# Patient Record
Sex: Male | Born: 1955
Health system: Southern US, Community
[De-identification: ages and names within clinical notes are randomized; demographics above are authoritative.]

## PROBLEM LIST (undated history)

## (undated) DIAGNOSIS — I219 Acute myocardial infarction, unspecified: Secondary | ICD-10-CM

## (undated) DIAGNOSIS — N289 Disorder of kidney and ureter, unspecified: Secondary | ICD-10-CM

## (undated) DIAGNOSIS — M1990 Unspecified osteoarthritis, unspecified site: Secondary | ICD-10-CM

## (undated) DIAGNOSIS — R5383 Other fatigue: Secondary | ICD-10-CM

## (undated) DIAGNOSIS — R011 Cardiac murmur, unspecified: Secondary | ICD-10-CM

## (undated) DIAGNOSIS — E876 Hypokalemia: Secondary | ICD-10-CM

## (undated) DIAGNOSIS — I5042 Chronic combined systolic (congestive) and diastolic (congestive) heart failure: Secondary | ICD-10-CM

## (undated) DIAGNOSIS — M869 Osteomyelitis, unspecified: Secondary | ICD-10-CM

## (undated) DIAGNOSIS — I509 Heart failure, unspecified: Secondary | ICD-10-CM

## (undated) DIAGNOSIS — I249 Acute ischemic heart disease, unspecified: Secondary | ICD-10-CM

## (undated) DIAGNOSIS — K3184 Gastroparesis: Secondary | ICD-10-CM

## (undated) DIAGNOSIS — E1142 Type 2 diabetes mellitus with diabetic polyneuropathy: Secondary | ICD-10-CM

## (undated) DIAGNOSIS — E119 Type 2 diabetes mellitus without complications: Secondary | ICD-10-CM

## (undated) DIAGNOSIS — M2550 Pain in unspecified joint: Secondary | ICD-10-CM

## (undated) DIAGNOSIS — D860 Sarcoidosis of lung: Secondary | ICD-10-CM

## (undated) DIAGNOSIS — R0602 Shortness of breath: Secondary | ICD-10-CM

## (undated) DIAGNOSIS — I255 Ischemic cardiomyopathy: Secondary | ICD-10-CM

## (undated) DIAGNOSIS — E785 Hyperlipidemia, unspecified: Secondary | ICD-10-CM

## (undated) DIAGNOSIS — N183 Chronic kidney disease, stage 3 unspecified: Secondary | ICD-10-CM

## (undated) DIAGNOSIS — G4730 Sleep apnea, unspecified: Secondary | ICD-10-CM

## (undated) DIAGNOSIS — G8929 Other chronic pain: Secondary | ICD-10-CM

## (undated) DIAGNOSIS — I252 Old myocardial infarction: Secondary | ICD-10-CM

## (undated) DIAGNOSIS — R1310 Dysphagia, unspecified: Secondary | ICD-10-CM

## (undated) DIAGNOSIS — M545 Low back pain, unspecified: Secondary | ICD-10-CM

## (undated) DIAGNOSIS — K5900 Constipation, unspecified: Secondary | ICD-10-CM

## (undated) DIAGNOSIS — E1110 Type 2 diabetes mellitus with ketoacidosis without coma: Secondary | ICD-10-CM

## (undated) DIAGNOSIS — I2510 Atherosclerotic heart disease of native coronary artery without angina pectoris: Secondary | ICD-10-CM

## (undated) DIAGNOSIS — D649 Anemia, unspecified: Secondary | ICD-10-CM

## (undated) DIAGNOSIS — F329 Major depressive disorder, single episode, unspecified: Secondary | ICD-10-CM

## (undated) DIAGNOSIS — K259 Gastric ulcer, unspecified as acute or chronic, without hemorrhage or perforation: Secondary | ICD-10-CM

## (undated) DIAGNOSIS — M109 Gout, unspecified: Secondary | ICD-10-CM

## (undated) DIAGNOSIS — E1143 Type 2 diabetes mellitus with diabetic autonomic (poly)neuropathy: Secondary | ICD-10-CM

## (undated) DIAGNOSIS — K625 Hemorrhage of anus and rectum: Secondary | ICD-10-CM

## (undated) DIAGNOSIS — G4733 Obstructive sleep apnea (adult) (pediatric): Secondary | ICD-10-CM

## (undated) DIAGNOSIS — Z9989 Dependence on other enabling machines and devices: Secondary | ICD-10-CM

## (undated) DIAGNOSIS — E538 Deficiency of other specified B group vitamins: Secondary | ICD-10-CM

## (undated) DIAGNOSIS — I10 Essential (primary) hypertension: Secondary | ICD-10-CM

## (undated) DIAGNOSIS — I119 Hypertensive heart disease without heart failure: Secondary | ICD-10-CM

## (undated) DIAGNOSIS — J449 Chronic obstructive pulmonary disease, unspecified: Secondary | ICD-10-CM

## (undated) DIAGNOSIS — K219 Gastro-esophageal reflux disease without esophagitis: Secondary | ICD-10-CM

## (undated) DIAGNOSIS — R079 Chest pain, unspecified: Secondary | ICD-10-CM

## (undated) DIAGNOSIS — F32A Depression, unspecified: Secondary | ICD-10-CM

## (undated) HISTORY — DX: Osteomyelitis, unspecified: M86.9

## (undated) HISTORY — DX: Ischemic cardiomyopathy: I25.5

## (undated) HISTORY — DX: Acute ischemic heart disease, unspecified: I24.9

## (undated) HISTORY — DX: Hypertensive heart disease without heart failure: I11.9

## (undated) HISTORY — DX: Sleep apnea, unspecified: G47.30

## (undated) HISTORY — DX: Disorder of kidney and ureter, unspecified: N28.9

## (undated) HISTORY — DX: Heart failure, unspecified: I50.9

## (undated) HISTORY — DX: Hypokalemia: E87.6

## (undated) HISTORY — PX: BREAST LUMPECTOMY: SHX2

## (undated) HISTORY — DX: Atherosclerotic heart disease of native coronary artery without angina pectoris: I25.10

## (undated) HISTORY — DX: Other chronic pain: G89.29

## (undated) HISTORY — PX: CATARACT EXTRACTION W/ INTRAOCULAR LENS  IMPLANT, BILATERAL: SHX1307

## (undated) HISTORY — DX: Low back pain, unspecified: M54.50

## (undated) HISTORY — DX: Chronic kidney disease, stage 3 unspecified: N18.30

## (undated) HISTORY — DX: Chest pain, unspecified: R07.9

## (undated) HISTORY — DX: Unspecified osteoarthritis, unspecified site: M19.90

## (undated) HISTORY — DX: Gastric ulcer, unspecified as acute or chronic, without hemorrhage or perforation: K25.9

## (undated) HISTORY — PX: TOE AMPUTATION: SHX809

## (undated) HISTORY — DX: Shortness of breath: R06.02

## (undated) HISTORY — DX: Dysphagia, unspecified: R13.10

## (undated) HISTORY — DX: Type 2 diabetes mellitus without complications: E11.9

## (undated) HISTORY — DX: Type 2 diabetes mellitus with diabetic polyneuropathy: E11.42

## (undated) HISTORY — DX: Deficiency of other specified B group vitamins: E53.8

## (undated) HISTORY — DX: Old myocardial infarction: I25.2

## (undated) HISTORY — DX: Chronic combined systolic (congestive) and diastolic (congestive) heart failure: I50.42

## (undated) HISTORY — DX: Chronic obstructive pulmonary disease, unspecified: J44.9

## (undated) HISTORY — DX: Pain in unspecified joint: M25.50

## (undated) HISTORY — DX: Morbid (severe) obesity due to excess calories: E66.01

## (undated) HISTORY — DX: Chronic kidney disease, stage 3 (moderate): N18.3

## (undated) HISTORY — DX: Other fatigue: R53.83

## (undated) HISTORY — DX: Constipation, unspecified: K59.00

---

## 1998-09-11 ENCOUNTER — Encounter: Admission: RE | Admit: 1998-09-11 | Discharge: 1998-12-10 | Payer: Self-pay | Admitting: Endocrinology

## 1999-09-23 ENCOUNTER — Encounter: Admission: RE | Admit: 1999-09-23 | Discharge: 1999-12-22 | Payer: Self-pay | Admitting: Endocrinology

## 1999-09-23 ENCOUNTER — Emergency Department (HOSPITAL_COMMUNITY): Admission: EM | Admit: 1999-09-23 | Discharge: 1999-09-23 | Payer: Self-pay | Admitting: Emergency Medicine

## 1999-09-24 ENCOUNTER — Encounter: Admission: RE | Admit: 1999-09-24 | Discharge: 1999-09-24 | Payer: Self-pay | Admitting: Surgery

## 1999-09-25 ENCOUNTER — Ambulatory Visit (HOSPITAL_BASED_OUTPATIENT_CLINIC_OR_DEPARTMENT_OTHER): Admission: RE | Admit: 1999-09-25 | Discharge: 1999-09-25 | Payer: Self-pay | Admitting: Surgery

## 1999-10-02 ENCOUNTER — Emergency Department (HOSPITAL_COMMUNITY): Admission: EM | Admit: 1999-10-02 | Discharge: 1999-10-02 | Payer: Self-pay | Admitting: Emergency Medicine

## 1999-10-09 ENCOUNTER — Emergency Department (HOSPITAL_COMMUNITY): Admission: EM | Admit: 1999-10-09 | Discharge: 1999-10-09 | Payer: Self-pay | Admitting: Emergency Medicine

## 2000-11-04 ENCOUNTER — Emergency Department (HOSPITAL_COMMUNITY): Admission: EM | Admit: 2000-11-04 | Discharge: 2000-11-04 | Payer: Self-pay | Admitting: Emergency Medicine

## 2000-11-04 ENCOUNTER — Encounter: Payer: Self-pay | Admitting: Emergency Medicine

## 2001-04-14 ENCOUNTER — Encounter: Payer: Self-pay | Admitting: Internal Medicine

## 2001-04-14 ENCOUNTER — Encounter: Admission: RE | Admit: 2001-04-14 | Discharge: 2001-04-14 | Payer: Self-pay | Admitting: Internal Medicine

## 2001-10-28 ENCOUNTER — Emergency Department (HOSPITAL_COMMUNITY): Admission: EM | Admit: 2001-10-28 | Discharge: 2001-10-28 | Payer: Self-pay | Admitting: Emergency Medicine

## 2001-10-29 ENCOUNTER — Inpatient Hospital Stay (HOSPITAL_COMMUNITY): Admission: EM | Admit: 2001-10-29 | Discharge: 2001-10-30 | Payer: Self-pay

## 2006-06-28 ENCOUNTER — Encounter (HOSPITAL_BASED_OUTPATIENT_CLINIC_OR_DEPARTMENT_OTHER): Admission: RE | Admit: 2006-06-28 | Discharge: 2006-09-26 | Payer: Self-pay | Admitting: Surgery

## 2006-07-08 ENCOUNTER — Ambulatory Visit (HOSPITAL_COMMUNITY): Admission: RE | Admit: 2006-07-08 | Discharge: 2006-07-08 | Payer: Self-pay | Admitting: Surgery

## 2006-12-07 ENCOUNTER — Emergency Department (HOSPITAL_COMMUNITY): Admission: EM | Admit: 2006-12-07 | Discharge: 2006-12-07 | Payer: Self-pay | Admitting: Emergency Medicine

## 2008-03-14 ENCOUNTER — Emergency Department (HOSPITAL_COMMUNITY): Admission: EM | Admit: 2008-03-14 | Discharge: 2008-03-14 | Payer: Self-pay | Admitting: Emergency Medicine

## 2008-05-31 ENCOUNTER — Inpatient Hospital Stay (HOSPITAL_COMMUNITY): Admission: AD | Admit: 2008-05-31 | Discharge: 2008-06-03 | Payer: Self-pay | Admitting: Internal Medicine

## 2008-06-01 ENCOUNTER — Encounter (INDEPENDENT_AMBULATORY_CARE_PROVIDER_SITE_OTHER): Payer: Self-pay | Admitting: Orthopedic Surgery

## 2010-12-13 HISTORY — PX: TOE AMPUTATION: SHX809

## 2010-12-15 ENCOUNTER — Encounter (HOSPITAL_BASED_OUTPATIENT_CLINIC_OR_DEPARTMENT_OTHER)
Admission: RE | Admit: 2010-12-15 | Discharge: 2011-01-12 | Payer: Self-pay | Source: Home / Self Care | Attending: General Surgery | Admitting: General Surgery

## 2010-12-15 ENCOUNTER — Ambulatory Visit (HOSPITAL_COMMUNITY)
Admission: RE | Admit: 2010-12-15 | Discharge: 2010-12-15 | Payer: Self-pay | Source: Home / Self Care | Attending: General Surgery | Admitting: General Surgery

## 2010-12-21 ENCOUNTER — Ambulatory Visit
Admission: RE | Admit: 2010-12-21 | Discharge: 2010-12-21 | Payer: Self-pay | Source: Home / Self Care | Attending: Vascular Surgery | Admitting: Vascular Surgery

## 2010-12-21 ENCOUNTER — Ambulatory Visit: Admit: 2010-12-21 | Payer: Self-pay | Admitting: Vascular Surgery

## 2010-12-27 ENCOUNTER — Encounter: Payer: Self-pay | Admitting: Infectious Diseases

## 2010-12-27 ENCOUNTER — Encounter
Admission: RE | Admit: 2010-12-27 | Discharge: 2010-12-27 | Payer: Self-pay | Source: Home / Self Care | Attending: Family Medicine | Admitting: Family Medicine

## 2011-01-12 ENCOUNTER — Encounter: Payer: Self-pay | Admitting: Infectious Diseases

## 2011-01-12 ENCOUNTER — Encounter: Payer: Self-pay | Admitting: *Deleted

## 2011-01-12 ENCOUNTER — Ambulatory Visit
Admission: RE | Admit: 2011-01-12 | Discharge: 2011-01-12 | Payer: Self-pay | Source: Home / Self Care | Attending: Infectious Diseases | Admitting: Infectious Diseases

## 2011-01-12 DIAGNOSIS — E785 Hyperlipidemia, unspecified: Secondary | ICD-10-CM | POA: Insufficient documentation

## 2011-01-12 DIAGNOSIS — E119 Type 2 diabetes mellitus without complications: Secondary | ICD-10-CM | POA: Insufficient documentation

## 2011-01-12 DIAGNOSIS — M869 Osteomyelitis, unspecified: Secondary | ICD-10-CM | POA: Insufficient documentation

## 2011-01-12 DIAGNOSIS — I10 Essential (primary) hypertension: Secondary | ICD-10-CM | POA: Insufficient documentation

## 2011-01-12 DIAGNOSIS — K219 Gastro-esophageal reflux disease without esophagitis: Secondary | ICD-10-CM | POA: Insufficient documentation

## 2011-01-12 DIAGNOSIS — M86179 Other acute osteomyelitis, unspecified ankle and foot: Secondary | ICD-10-CM | POA: Insufficient documentation

## 2011-01-12 DIAGNOSIS — M86172 Other acute osteomyelitis, left ankle and foot: Secondary | ICD-10-CM | POA: Insufficient documentation

## 2011-01-13 ENCOUNTER — Encounter (HOSPITAL_BASED_OUTPATIENT_CLINIC_OR_DEPARTMENT_OTHER): Payer: BC Managed Care – PPO | Attending: General Surgery

## 2011-01-13 ENCOUNTER — Other Ambulatory Visit: Payer: Self-pay

## 2011-01-13 DIAGNOSIS — L97509 Non-pressure chronic ulcer of other part of unspecified foot with unspecified severity: Secondary | ICD-10-CM | POA: Insufficient documentation

## 2011-01-13 DIAGNOSIS — M869 Osteomyelitis, unspecified: Secondary | ICD-10-CM | POA: Insufficient documentation

## 2011-01-13 DIAGNOSIS — E1169 Type 2 diabetes mellitus with other specified complication: Secondary | ICD-10-CM | POA: Insufficient documentation

## 2011-01-13 DIAGNOSIS — Z794 Long term (current) use of insulin: Secondary | ICD-10-CM | POA: Insufficient documentation

## 2011-01-13 DIAGNOSIS — S98139A Complete traumatic amputation of one unspecified lesser toe, initial encounter: Secondary | ICD-10-CM | POA: Insufficient documentation

## 2011-01-14 ENCOUNTER — Encounter: Payer: Self-pay | Admitting: Infectious Diseases

## 2011-01-14 ENCOUNTER — Other Ambulatory Visit: Payer: Self-pay

## 2011-01-15 ENCOUNTER — Other Ambulatory Visit: Payer: Self-pay

## 2011-01-18 ENCOUNTER — Encounter (HOSPITAL_BASED_OUTPATIENT_CLINIC_OR_DEPARTMENT_OTHER)
Admission: RE | Admit: 2011-01-18 | Discharge: 2011-01-18 | Disposition: A | Discharge status: Home / Self Care | Payer: BC Managed Care – PPO | Source: Ambulatory Visit | Attending: Orthopedic Surgery | Admitting: Orthopedic Surgery

## 2011-01-18 ENCOUNTER — Other Ambulatory Visit: Payer: Self-pay | Admitting: Orthopedic Surgery

## 2011-01-18 ENCOUNTER — Encounter (HOSPITAL_BASED_OUTPATIENT_CLINIC_OR_DEPARTMENT_OTHER): Payer: BC Managed Care – PPO

## 2011-01-18 ENCOUNTER — Other Ambulatory Visit: Payer: Self-pay

## 2011-01-18 DIAGNOSIS — Z01810 Encounter for preprocedural cardiovascular examination: Secondary | ICD-10-CM | POA: Insufficient documentation

## 2011-01-18 LAB — BASIC METABOLIC PANEL
BUN: 14 mg/dL (ref 6–23)
CO2: 27 mEq/L (ref 19–32)
Calcium: 9.1 mg/dL (ref 8.4–10.5)
Chloride: 105 mEq/L (ref 96–112)
Creatinine, Ser: 1.09 mg/dL (ref 0.4–1.5)
GFR calc Af Amer: >60 mL/min (ref >60)
GFR calc non Af Amer: >60 mL/min (ref >60)
Glucose, Bld: 79 mg/dL (ref 70–99)
Potassium: 3.8 mEq/L (ref 3.5–5.1)
Sodium: 140 mEq/L (ref 135–145)

## 2011-01-19 ENCOUNTER — Other Ambulatory Visit: Payer: Self-pay

## 2011-01-19 ENCOUNTER — Encounter (HOSPITAL_BASED_OUTPATIENT_CLINIC_OR_DEPARTMENT_OTHER): Payer: BC Managed Care – PPO

## 2011-01-19 ENCOUNTER — Ambulatory Visit (HOSPITAL_BASED_OUTPATIENT_CLINIC_OR_DEPARTMENT_OTHER)
Admission: RE | Admit: 2011-01-19 | Discharge: 2011-01-19 | Disposition: A | Discharge status: Home / Self Care | Payer: BC Managed Care – PPO | Source: Ambulatory Visit | Attending: Orthopedic Surgery | Admitting: Orthopedic Surgery

## 2011-01-19 DIAGNOSIS — Z01812 Encounter for preprocedural laboratory examination: Secondary | ICD-10-CM | POA: Insufficient documentation

## 2011-01-19 DIAGNOSIS — Z01818 Encounter for other preprocedural examination: Secondary | ICD-10-CM | POA: Insufficient documentation

## 2011-01-19 DIAGNOSIS — M869 Osteomyelitis, unspecified: Secondary | ICD-10-CM | POA: Insufficient documentation

## 2011-01-19 LAB — POCT HEMOGLOBIN-HEMACUE: Hemoglobin: 10.8 g/dL — ABNORMAL LOW (ref 13.0–17.0)

## 2011-01-19 LAB — GLUCOSE, CAPILLARY
Glucose-Capillary: 234 mg/dL — ABNORMAL HIGH (ref 70–99)
Glucose-Capillary: 231 mg/dL — ABNORMAL HIGH (ref 70–99)
Glucose-Capillary: 224 mg/dL — ABNORMAL HIGH (ref 70–99)
Glucose-Capillary: 71 mg/dL (ref 70–99)
Glucose-Capillary: 76 mg/dL (ref 70–99)
Glucose-Capillary: 51 mg/dL — ABNORMAL LOW (ref 70–99)
Glucose-Capillary: 56 mg/dL — ABNORMAL LOW (ref 70–99)
Glucose-Capillary: 46 mg/dL — ABNORMAL LOW (ref 70–99)
Glucose-Capillary: 51 mg/dL — ABNORMAL LOW (ref 70–99)

## 2011-01-20 ENCOUNTER — Other Ambulatory Visit: Payer: Self-pay

## 2011-01-20 NOTE — Assessment & Plan Note (Signed)
Summary: new pt osteomyelitis   CC:  new pt. infection right foot second toe.  History of Present Illness: 55 yo M with hx of DM for > 20 years, prev amputation R 3rd toe 2009. He developed an ulceration on the tip of his R 2nd toe at the end of December 2011. He had MRI (12-27-10 at [facility]) which confirmed osteomyelitis, cellulitis and abscess, ESR 73, HgBA1C 10.7. Cx of D/C from his toe grew Group G streptoccus. Was started on prev .anbx for 10 days and no improvement, then changed to Doxy, completed 10 days 01-06-11.  Minimal d/c from toe. Has had proximal erythema and soreness. Had fever 2 weeks ago. No chills. Denies numbness in his foot. Has some pain in the ball of his foot.   Preventive Screening-Counseling & Management  Alcohol-Tobacco     Alcohol drinks/day: one per day     Alcohol type: beer     Smoking Status: never  Caffeine-Diet-Exercise     Caffeine use/day: occasional soda     Does Patient Exercise: active at work  Paramedic Use: yes      Drug Use:  no.     Updated Prior Medication List: ATENOLOL 50 MG TABS (ATENOLOL) Take 1 tablet by mouth once a day ALLOPURINOL 300 MG TABS (ALLOPURINOL) Take 1 tablet by mouth once a day HUMULIN 70/30 70-30 % SUSP (INSULIN ISOPHANE & REGULAR) 40 units twice daily LANTUS 100 UNIT/ML SOLN (INSULIN GLARGINE) 60 units once daily EXFORGE HCT 10-320-25 MG TABS (AMLODIPINE-VALSARTAN-HCTZ) Take 1 tablet by mouth once a day ATORVASTATIN CALCIUM 40 MG TABS (ATORVASTATIN CALCIUM) Take 1 tablet by mouth once a day HYDROCODONE-ACETAMINOPHEN 5-500 MG TABS (HYDROCODONE-ACETAMINOPHEN) one tablet every 4-6 hours as needed  Current Allergies (reviewed today): No known allergies  Past History:  Past Medical History: Diabetes mellitus, type II since 1991 Hyperlipidemia Hypertension GERD/Diabetic Gastroparesis  Current Medications (verified): 1)  Atenolol 50 Mg Tabs (Atenolol) .... Take 1 Tablet By Mouth Once A Day 2)   Allopurinol 300 Mg Tabs (Allopurinol) .... Take 1 Tablet By Mouth Once A Day 3)  Humulin 70/30 70-30 % Susp (Insulin Isophane & Regular) .... 40 Units Twice Daily 4)  Lantus 100 Unit/ml Soln (Insulin Glargine) .... 60 Units Once Daily 5)  Exforge Hct 10-320-25 Mg Tabs (Amlodipine-Valsartan-Hctz) .... Take 1 Tablet By Mouth Once A Day 6)  Atorvastatin Calcium 40 Mg Tabs (Atorvastatin Calcium) .... Take 1 Tablet By Mouth Once A Day 7)  Hydrocodone-Acetaminophen 5-500 Mg Tabs (Hydrocodone-Acetaminophen) .... One Tablet Every 4-6 Hours As Needed  Allergies (verified): No Known Drug Allergies   Family History: mother died when he was very young, never knew his father. Grandparents had DM, Gout, HTN. Aunt with MI at 23. Grandfather with prostate CA.  Social History: Married Never Smoked Alcohol use-yes- every 2 weeks. beer. heavy.  Drug use-no Drug Use:  no  Review of Systems       FSGs- on average 150. nl BM, nl urination, no ophtho last year. wt is stable- down 13# over the last month. Loss of apetite/decreased taste.   Vital Signs:  Patient profile:   55 year old male Height:      69 inches (175.26 cm) Weight:      295.8 pounds (134.45 kg) BMI:     43.84 Temp:     98.6 degrees F (37.00 degrees C) oral Pulse rate:   76 / minute BP sitting:   164 / 85  (left arm)  Vitals Entered By: Elige Radon RN (January 12, 2011 11:22 AM) CC: new pt. infection right foot second toe Is Patient Diabetic? Yes Did you bring your meter with you today? No Pain Assessment Patient in pain? no      Nutritional Status BMI of > 30 = obese Nutritional Status Detail appetite "not good"  Have you ever been in a relationship where you felt threatened, hurt or afraid?No   Does patient need assistance? Functional Status Self care Ambulation Normal Comments no missed doses of meds per pt.   Physical Exam  General:  well-developed, well-nourished, well-hydrated, and overweight-appearing.     Eyes:  pupils equal, pupils round, and pupils reactive to light.   Mouth:  pharynx pink and moist and no exudates.   Neck:  no masses.  no masses.   Lungs:  normal respiratory effort and normal breath sounds.  normal respiratory effort and normal breath sounds.   Heart:  normal rate, regular rhythm, and no murmur.  normal rate, regular rhythm, and no murmur.   Abdomen:  soft, non-tender, and normal bowel sounds.  soft, non-tender, and normal bowel sounds.   Extremities:  R Foot- his 2nd toe is grossly enlarged with serous d/c. there is no pus expressable. His foot is non-tender. there are 3 os that are visible (on nail bed, on lateral portion). there is also abrasion on more proximal toe.    Impression & Recommendations:  Problem # 1:  ACUTE OSTEOMYELITIS, ANKLE AND FOOT (ICD-730.07) His toe and foot are clearly infected. Spoke with him that he will need to be seen by a Psychologist, sport and exercise. I am not sure that he will be able to keep his toe. WIll try a course of levaquin (will try his prev strep, some staph, and pseudomonas). He will continue on his wound care visits. i would defer to orthopedics regarding possible amputation of the toe, he is amanable to this if this is what will ultimately happen anyway. I counseled him that he needs better Glc control if possible, he will f/u with his endocrinologist. return to clinic 4 weeks.  The following medications were removed from the medication list:    Doxycycline Hyclate 100 Mg Tabs (Doxycycline hyclate) .[name]... Take 1 tablet by mouth two times a day His updated medication list for this problem includes:    Hydrocodone-acetaminophen 5-500 Mg Tabs (Hydrocodone-acetaminophen) ..... One tablet every 4-6 hours as needed    Levaquin 750 Mg Tabs (Levofloxacin) .[name]... Take 1 tablet by mouth once a day  Orders: Orthopedic Surgeon Referral (Ortho Surgeon) Consultation Level IV [phone])  Medications Added to Medication List This Visit: 1)  Humulin 70/30 70-30 % Susp  (Insulin isophane & regular) .... 40 units twice daily 2)  Exforge Hct 10-320-25 Mg Tabs (Amlodipine-valsartan-hctz) .... Take 1 tablet by mouth once a day 3)  Atorvastatin Calcium 40 Mg Tabs (Atorvastatin calcium) .... Take 1 tablet by mouth once a day 4)  Hydrocodone-acetaminophen 5-500 Mg Tabs (Hydrocodone-acetaminophen) .... One tablet every 4-6 hours as needed 5)  Levaquin 750 Mg Tabs (Levofloxacin) .... Take 1 tablet by mouth once a day Prescriptions: LEVAQUIN 750 MG TABS (LEVOFLOXACIN) Take 1 tablet by mouth once a day  #30 x 3   Entered and Authorized by:   Bobby Rumpf MD   Signed by:   Bobby Rumpf MD on 01/12/2011   Method used:   Electronically to        [facility] (retail)       [phone]  [place], [place]  [address]       Ph: [phone]       Fax: [phone]   RxID:   [id number]

## 2011-01-20 NOTE — Miscellaneous (Addendum)
  Clinical Lists Changes  Problems: Added new problem of HYPERTENSION NEC (ICD-997.91) - Signed Medications: Added new medication of ATENOLOL 50 MG TABS (ATENOLOL) Take 1 tablet by mouth once a day - Signed Added new medication of ALLOPURINOL 300 MG TABS (ALLOPURINOL) Take 1 tablet by mouth once a day - Signed Added new medication of DOXYCYCLINE HYCLATE 100 MG TABS (DOXYCYCLINE HYCLATE) Take 1 tablet by mouth two times a day - Signed Added new medication of HUMALOG 100 UNIT/ML SOLN (INSULIN LISPRO (HUMAN)) sliding scale as directed - Signed Added new medication of LANTUS 100 UNIT/ML SOLN (INSULIN GLARGINE) 60 units once daily - Signed

## 2011-01-21 ENCOUNTER — Other Ambulatory Visit: Payer: Self-pay

## 2011-01-21 LAB — GLUCOSE, CAPILLARY
Glucose-Capillary: 245 mg/dL — ABNORMAL HIGH (ref 70–99)
Glucose-Capillary: 209 mg/dL — ABNORMAL HIGH (ref 70–99)
Glucose-Capillary: 191 mg/dL — ABNORMAL HIGH (ref 70–99)
Glucose-Capillary: 122 mg/dL — ABNORMAL HIGH (ref 70–99)
Glucose-Capillary: 232 mg/dL — ABNORMAL HIGH (ref 70–99)
Glucose-Capillary: 139 mg/dL — ABNORMAL HIGH (ref 70–99)
Glucose-Capillary: 191 mg/dL — ABNORMAL HIGH (ref 70–99)
Glucose-Capillary: 163 mg/dL — ABNORMAL HIGH (ref 70–99)
Glucose-Capillary: 205 mg/dL — ABNORMAL HIGH (ref 70–99)
Glucose-Capillary: 191 mg/dL — ABNORMAL HIGH (ref 70–99)

## 2011-01-22 ENCOUNTER — Other Ambulatory Visit: Payer: Self-pay

## 2011-01-22 LAB — WOUND CULTURE: Culture: NO GROWTH

## 2011-01-22 LAB — GLUCOSE, CAPILLARY
Glucose-Capillary: 200 mg/dL — ABNORMAL HIGH (ref 70–99)
Glucose-Capillary: 159 mg/dL — ABNORMAL HIGH (ref 70–99)

## 2011-01-24 LAB — ANAEROBIC CULTURE

## 2011-01-25 ENCOUNTER — Other Ambulatory Visit: Payer: Self-pay

## 2011-01-25 LAB — GLUCOSE, CAPILLARY
Glucose-Capillary: 253 mg/dL — ABNORMAL HIGH (ref 70–99)
Glucose-Capillary: 229 mg/dL — ABNORMAL HIGH (ref 70–99)

## 2011-01-26 ENCOUNTER — Other Ambulatory Visit: Payer: Self-pay

## 2011-01-26 LAB — GLUCOSE, CAPILLARY
Glucose-Capillary: 186 mg/dL — ABNORMAL HIGH (ref 70–99)
Glucose-Capillary: 183 mg/dL — ABNORMAL HIGH (ref 70–99)

## 2011-01-27 ENCOUNTER — Other Ambulatory Visit: Payer: Self-pay

## 2011-01-27 LAB — GLUCOSE, CAPILLARY
Glucose-Capillary: 204 mg/dL — ABNORMAL HIGH (ref 70–99)
Glucose-Capillary: 166 mg/dL — ABNORMAL HIGH (ref 70–99)

## 2011-01-29 ENCOUNTER — Other Ambulatory Visit: Payer: Self-pay

## 2011-01-29 LAB — GLUCOSE, CAPILLARY: Glucose-Capillary: 207 mg/dL — ABNORMAL HIGH (ref 70–99)

## 2011-02-01 ENCOUNTER — Other Ambulatory Visit: Payer: Self-pay

## 2011-02-01 LAB — GLUCOSE, CAPILLARY: Glucose-Capillary: 148 mg/dL — ABNORMAL HIGH (ref 70–99)

## 2011-02-02 ENCOUNTER — Other Ambulatory Visit: Payer: Self-pay

## 2011-02-02 LAB — GLUCOSE, CAPILLARY
Glucose-Capillary: 205 mg/dL — ABNORMAL HIGH (ref 70–99)
Glucose-Capillary: 211 mg/dL — ABNORMAL HIGH (ref 70–99)
Glucose-Capillary: 300 mg/dL — ABNORMAL HIGH (ref 70–99)
Glucose-Capillary: 285 mg/dL — ABNORMAL HIGH (ref 70–99)

## 2011-02-03 ENCOUNTER — Other Ambulatory Visit: Payer: Self-pay

## 2011-02-03 LAB — GLUCOSE, CAPILLARY
Glucose-Capillary: 220 mg/dL — ABNORMAL HIGH (ref 70–99)
Glucose-Capillary: 156 mg/dL — ABNORMAL HIGH (ref 70–99)

## 2011-02-04 ENCOUNTER — Other Ambulatory Visit: Payer: Self-pay

## 2011-02-04 LAB — GLUCOSE, CAPILLARY
Glucose-Capillary: 160 mg/dL — ABNORMAL HIGH (ref 70–99)
Glucose-Capillary: 154 mg/dL — ABNORMAL HIGH (ref 70–99)

## 2011-02-08 ENCOUNTER — Other Ambulatory Visit: Payer: Self-pay

## 2011-02-09 ENCOUNTER — Other Ambulatory Visit: Payer: Self-pay

## 2011-02-09 LAB — GLUCOSE, CAPILLARY
Glucose-Capillary: 225 mg/dL — ABNORMAL HIGH (ref 70–99)
Glucose-Capillary: 241 mg/dL — ABNORMAL HIGH (ref 70–99)
Glucose-Capillary: 259 mg/dL — ABNORMAL HIGH (ref 70–99)
Glucose-Capillary: 245 mg/dL — ABNORMAL HIGH (ref 70–99)

## 2011-02-09 NOTE — Miscellaneous (Signed)
Summary: HIPAA Restrictions  HIPAA Restrictions   Imported By: Bonner Puna 02/05/2011 10:40:31  _____________________________________________________________________  External Attachment:    Type:   Image     Comment:   External Document

## 2011-02-10 ENCOUNTER — Ambulatory Visit: Payer: Self-pay | Admitting: Infectious Diseases

## 2011-02-11 ENCOUNTER — Other Ambulatory Visit: Payer: Self-pay

## 2011-02-11 ENCOUNTER — Encounter (HOSPITAL_BASED_OUTPATIENT_CLINIC_OR_DEPARTMENT_OTHER): Payer: BC Managed Care – PPO | Attending: General Surgery

## 2011-02-11 DIAGNOSIS — M869 Osteomyelitis, unspecified: Secondary | ICD-10-CM | POA: Insufficient documentation

## 2011-02-11 DIAGNOSIS — E1169 Type 2 diabetes mellitus with other specified complication: Secondary | ICD-10-CM | POA: Insufficient documentation

## 2011-02-11 DIAGNOSIS — L97509 Non-pressure chronic ulcer of other part of unspecified foot with unspecified severity: Secondary | ICD-10-CM | POA: Insufficient documentation

## 2011-02-11 DIAGNOSIS — S98139A Complete traumatic amputation of one unspecified lesser toe, initial encounter: Secondary | ICD-10-CM | POA: Insufficient documentation

## 2011-02-11 DIAGNOSIS — Z794 Long term (current) use of insulin: Secondary | ICD-10-CM | POA: Insufficient documentation

## 2011-02-12 ENCOUNTER — Other Ambulatory Visit: Payer: Self-pay

## 2011-02-12 LAB — GLUCOSE, CAPILLARY
Glucose-Capillary: 103 mg/dL — ABNORMAL HIGH (ref 70–99)
Glucose-Capillary: 107 mg/dL — ABNORMAL HIGH (ref 70–99)
Glucose-Capillary: 120 mg/dL — ABNORMAL HIGH (ref 70–99)
Glucose-Capillary: 96 mg/dL (ref 70–99)
Glucose-Capillary: 154 mg/dL — ABNORMAL HIGH (ref 70–99)

## 2011-02-15 ENCOUNTER — Other Ambulatory Visit: Payer: Self-pay

## 2011-02-15 LAB — GLUCOSE, CAPILLARY
Glucose-Capillary: 274 mg/dL — ABNORMAL HIGH (ref 70–99)
Glucose-Capillary: 180 mg/dL — ABNORMAL HIGH (ref 70–99)

## 2011-02-16 ENCOUNTER — Other Ambulatory Visit: Payer: Self-pay

## 2011-02-16 LAB — GLUCOSE, CAPILLARY
Glucose-Capillary: 162 mg/dL — ABNORMAL HIGH (ref 70–99)
Glucose-Capillary: 151 mg/dL — ABNORMAL HIGH (ref 70–99)

## 2011-02-17 ENCOUNTER — Other Ambulatory Visit: Payer: Self-pay

## 2011-02-17 LAB — GLUCOSE, CAPILLARY
Glucose-Capillary: 189 mg/dL — ABNORMAL HIGH (ref 70–99)
Glucose-Capillary: 239 mg/dL — ABNORMAL HIGH (ref 70–99)

## 2011-02-18 ENCOUNTER — Other Ambulatory Visit: Payer: Self-pay

## 2011-02-18 LAB — GLUCOSE, CAPILLARY
Glucose-Capillary: 254 mg/dL — ABNORMAL HIGH (ref 70–99)
Glucose-Capillary: 233 mg/dL — ABNORMAL HIGH (ref 70–99)

## 2011-02-18 NOTE — Consult Note (Signed)
Summary: [facility]   Imported By: Bonner Puna 02/09/2011 16:09:07  _____________________________________________________________________  External Attachment:    Type:   Image     Comment:   External Document

## 2011-02-19 ENCOUNTER — Other Ambulatory Visit: Payer: Self-pay

## 2011-02-19 LAB — GLUCOSE, CAPILLARY
Glucose-Capillary: 143 mg/dL — ABNORMAL HIGH (ref 70–99)
Glucose-Capillary: 99 mg/dL (ref 70–99)

## 2011-02-20 ENCOUNTER — Encounter: Payer: Self-pay | Admitting: *Deleted

## 2011-02-22 ENCOUNTER — Other Ambulatory Visit: Payer: Self-pay

## 2011-02-22 LAB — GLUCOSE, CAPILLARY
Glucose-Capillary: 264 mg/dL — ABNORMAL HIGH (ref 70–99)
Glucose-Capillary: 254 mg/dL — ABNORMAL HIGH (ref 70–99)

## 2011-02-23 ENCOUNTER — Other Ambulatory Visit: Payer: Self-pay

## 2011-02-23 LAB — GLUCOSE, CAPILLARY
Glucose-Capillary: 131 mg/dL — ABNORMAL HIGH (ref 70–99)
Glucose-Capillary: 93 mg/dL (ref 70–99)

## 2011-02-24 ENCOUNTER — Other Ambulatory Visit: Payer: Self-pay

## 2011-02-24 LAB — GLUCOSE, CAPILLARY
Glucose-Capillary: 122 mg/dL — ABNORMAL HIGH (ref 70–99)
Glucose-Capillary: 123 mg/dL — ABNORMAL HIGH (ref 70–99)

## 2011-02-25 LAB — GLUCOSE, CAPILLARY
Glucose-Capillary: 228 mg/dL — ABNORMAL HIGH (ref 70–99)
Glucose-Capillary: 242 mg/dL — ABNORMAL HIGH (ref 70–99)

## 2011-02-28 NOTE — Op Note (Signed)
  NAMEJAYKE, CAUL NO.:  [id number]  MEDICAL RECORD NO.:  [id number]           PATIENT TYPE:  LOCATION:                                 FACILITY:  PHYSICIAN:  Weber Cooks, M.D.     DATE OF BIRTH:  14-Jul-1956  DATE OF PROCEDURE:  01/19/2011 DATE OF DISCHARGE:                              OPERATIVE REPORT   PREOPERATIVE DIAGNOSIS:  Osteomyelitis, right second toe.  POSTOPERATIVE DIAGNOSIS:  Osteomyelitis, right second toe.  OPERATION:  Right second toe amputation through metatarsophalangeal joint.  ANESTHESIA:  General.  SURGEON:  Weber Cooks, MD  ASSISTANT:  Erskine Emery, PA-C  ESTIMATED BLOOD LOSS:  Minimal.  TOURNIQUET:  None.  COMPLICATIONS:  None.  DISPOSITION:  Stable to PR.  CULTURES:  Aerobic and anaerobic obtained intraoperatively.  INDICATIONS:  A 55 year old male who has had previous third toe amputation as well as partial ray amputation of third metatarsal in the past.  He developed swelling and erythema around his right second toe. X-rays revealed that he had osteomyelitis of the right second toe.  He was consented for the above procedure.  All risks of infection, neurovessel injury, more proximal amputation, wound healing problems, DVT, and PE were all explained.  Questions were encouraged and answered.  OPERATION:  The patient was brought to the operating room and placed in supine position.  After adequate general endotracheal tube anesthesia was administered as well as Ancef 1 g IV piggyback, right lower extremity was then prepped and draped in a sterile manner over a proximally placed thigh tourniquet which was not used.  A racket-shaped incision based dorsally was then made at the base of the right second toe.  Dissection was carried down full thickness down to bone. Hemostasis was obtained.  The toe was disarticulated.  There was purulence within the MTP joint.  Cultures were obtained, aerobic and anaerobic at this  time.  Toe was disarticulated.  Synovitis locally within the joint was removed as well as any infected tissue in the area. The foot was then palpated and squeezed and there was no expressible purulence within the planes of the foot and there was no palpable fluctuance areas were within the foot as well or erythema or swelling.  It was purely isolated to the second toe.  The area was copiously irrigated with normal saline. Skin was closed with 4-0 nylon stitch.  Sterile dressing was applied. Hard sole shoe was applied.  The patient was stable to PR.     Weber Cooks, M.D.     PB/MEDQ  D:  01/19/2011  T:  01/20/2011  Job:  [id number]  Electronically Signed by Weber Cooks M.D. on 02/28/2011 08:30:58 AM

## 2011-03-01 ENCOUNTER — Other Ambulatory Visit: Payer: Self-pay

## 2011-03-01 LAB — GLUCOSE, CAPILLARY
Glucose-Capillary: 233 mg/dL — ABNORMAL HIGH (ref 70–99)
Glucose-Capillary: 230 mg/dL — ABNORMAL HIGH (ref 70–99)

## 2011-03-02 ENCOUNTER — Other Ambulatory Visit: Payer: Self-pay

## 2011-03-02 LAB — GLUCOSE, CAPILLARY
Glucose-Capillary: 191 mg/dL — ABNORMAL HIGH (ref 70–99)
Glucose-Capillary: 175 mg/dL — ABNORMAL HIGH (ref 70–99)

## 2011-03-03 LAB — AFB CULTURE WITH SMEAR (NOT AT ARMC): Acid Fast Smear: NONE SEEN

## 2011-03-04 ENCOUNTER — Other Ambulatory Visit: Payer: Self-pay

## 2011-03-04 LAB — GLUCOSE, CAPILLARY
Glucose-Capillary: 146 mg/dL — ABNORMAL HIGH (ref 70–99)
Glucose-Capillary: 160 mg/dL — ABNORMAL HIGH (ref 70–99)

## 2011-03-05 ENCOUNTER — Other Ambulatory Visit: Payer: Self-pay

## 2011-03-08 ENCOUNTER — Other Ambulatory Visit: Payer: Self-pay

## 2011-03-08 LAB — GLUCOSE, CAPILLARY
Glucose-Capillary: 131 mg/dL — ABNORMAL HIGH (ref 70–99)
Glucose-Capillary: 143 mg/dL — ABNORMAL HIGH (ref 70–99)

## 2011-03-09 ENCOUNTER — Other Ambulatory Visit: Payer: Self-pay

## 2011-03-09 LAB — GLUCOSE, CAPILLARY
Glucose-Capillary: 153 mg/dL — ABNORMAL HIGH (ref 70–99)
Glucose-Capillary: 142 mg/dL — ABNORMAL HIGH (ref 70–99)

## 2011-03-10 ENCOUNTER — Other Ambulatory Visit (HOSPITAL_BASED_OUTPATIENT_CLINIC_OR_DEPARTMENT_OTHER): Payer: Self-pay | Admitting: General Surgery

## 2011-03-10 LAB — GLUCOSE, CAPILLARY
Glucose-Capillary: 175 mg/dL — ABNORMAL HIGH (ref 70–99)
Glucose-Capillary: 180 mg/dL — ABNORMAL HIGH (ref 70–99)
Glucose-Capillary: 167 mg/dL — ABNORMAL HIGH (ref 70–99)
Glucose-Capillary: 181 mg/dL — ABNORMAL HIGH (ref 70–99)

## 2011-03-11 ENCOUNTER — Other Ambulatory Visit (HOSPITAL_BASED_OUTPATIENT_CLINIC_OR_DEPARTMENT_OTHER): Payer: Self-pay | Admitting: General Surgery

## 2011-03-11 LAB — GLUCOSE, CAPILLARY
Glucose-Capillary: 268 mg/dL — ABNORMAL HIGH (ref 70–99)
Glucose-Capillary: 183 mg/dL — ABNORMAL HIGH (ref 70–99)

## 2011-03-12 ENCOUNTER — Other Ambulatory Visit (HOSPITAL_BASED_OUTPATIENT_CLINIC_OR_DEPARTMENT_OTHER): Payer: Self-pay | Admitting: General Surgery

## 2011-03-12 LAB — GLUCOSE, CAPILLARY
Glucose-Capillary: 224 mg/dL — ABNORMAL HIGH (ref 70–99)
Glucose-Capillary: 223 mg/dL — ABNORMAL HIGH (ref 70–99)

## 2011-03-15 ENCOUNTER — Encounter (HOSPITAL_BASED_OUTPATIENT_CLINIC_OR_DEPARTMENT_OTHER): Payer: BC Managed Care – PPO | Attending: Plastic Surgery

## 2011-03-15 ENCOUNTER — Other Ambulatory Visit: Payer: Self-pay | Admitting: Plastic Surgery

## 2011-03-15 DIAGNOSIS — L97509 Non-pressure chronic ulcer of other part of unspecified foot with unspecified severity: Secondary | ICD-10-CM | POA: Insufficient documentation

## 2011-03-15 DIAGNOSIS — S98139A Complete traumatic amputation of one unspecified lesser toe, initial encounter: Secondary | ICD-10-CM | POA: Insufficient documentation

## 2011-03-15 DIAGNOSIS — M869 Osteomyelitis, unspecified: Secondary | ICD-10-CM | POA: Insufficient documentation

## 2011-03-15 DIAGNOSIS — Z794 Long term (current) use of insulin: Secondary | ICD-10-CM | POA: Insufficient documentation

## 2011-03-15 DIAGNOSIS — E1169 Type 2 diabetes mellitus with other specified complication: Secondary | ICD-10-CM | POA: Insufficient documentation

## 2011-03-16 ENCOUNTER — Other Ambulatory Visit: Payer: Self-pay | Admitting: Plastic Surgery

## 2011-03-16 LAB — GLUCOSE, CAPILLARY
Glucose-Capillary: 132 mg/dL — ABNORMAL HIGH (ref 70–99)
Glucose-Capillary: 148 mg/dL — ABNORMAL HIGH (ref 70–99)
Glucose-Capillary: 234 mg/dL — ABNORMAL HIGH (ref 70–99)
Glucose-Capillary: 168 mg/dL — ABNORMAL HIGH (ref 70–99)

## 2011-03-17 ENCOUNTER — Other Ambulatory Visit: Payer: Self-pay | Admitting: Plastic Surgery

## 2011-03-17 LAB — GLUCOSE, CAPILLARY
Glucose-Capillary: 154 mg/dL — ABNORMAL HIGH (ref 70–99)
Glucose-Capillary: 158 mg/dL — ABNORMAL HIGH (ref 70–99)

## 2011-04-27 NOTE — Op Note (Signed)
NAMEDAVIE, CLAUD NO.:  [id number]   MEDICAL RECORD NO.:  [id number]          PATIENT TYPE:  INP   LOCATION:  79                         FACILITY:  [facility]   PHYSICIAN:  Doran Heater. Veverly Fells, M.D. DATE OF BIRTH:  04/17/1956   DATE OF PROCEDURE:  06/01/2008  DATE OF DISCHARGE:                               OPERATIVE REPORT   PREOPERATIVE DIAGNOSIS:  Right third toe infection.   POSTOPERATIVE DIAGNOSIS:  Right third toe infection.   PROCEDURE PERFORMED:  Right foot third toe amputation with distal  metatarsal amputation (ray amputation).   ATTENDING SURGEON:  Doran Heater. Veverly Fells, M.D.   ASSISTANT:  None.   ANESTHESIA:  General.   ESTIMATED BLOOD LOSS:  Minimal.   TOURNIQUET TIME:  20 minutes.   FLUID REPLACEMENT:  500 mL crystalloid.   INSTRUMENT COUNT:  Correct.   COMPLICATIONS:  None.  Preoperative antibiotics were given, Zosyn.  Intraoperative cultures were sent.   INDICATIONS:  The patient is a 54 year old male who was admitted to  internal medicine service with a 10 day history of a right third toe  necrosis.  The patient has documented osteomyelitis on x-ray.  The  patient presents now for worsening infection and swelling in his foot.  Informed consent was obtained.   DESCRIPTION OF PROCEDURE:  After an adequate level of anesthesia was  achieved the patient's right foot was sterilely prepped and draped.  Nonsterile tourniquet was utilized on the calf after elevation for 4  minutes to 5 minutes.  We went ahead and elevated the tourniquet to 300  mmHg.  A longitudinal dorsal incision with a fishmouth type incision  distally around the third toe was created.  The necrotic toe was  amputated at the level of the MP joint.  We controlled hemostasis with  Bovie electrocautery.  It was then decided due to the prominence of the  metatarsal head we would go ahead and remove that so that cartilage was  not exposed to air.  We plan on leaving this open due  to the severe  swelling of the foot and likely contamination and cellulitis present  throughout.  Cultures were obtained, both anaerobic, aerobic cultures  plus stat Gram stain.  At this point we went ahead and used an  oscillating saw to remove the distal aspect of the metatarsal head at  the neck level.  Once this was done Bovie electrocautery was again used.  We then held pressure and deflated the tourniquet 20 minutes.  Once the  pressure had been held for 5 minutes  we found that hemostasis was excellent.  We then packed with a very,  very dilute Betadine dressing followed by a fluffy dressing and this was  a compressed type dressing.  The patient was taken to the recovery room  extubated having tolerated surgery well.      Doran Heater. Veverly Fells, M.D.  Electronically Signed     SRN/MEDQ  D:  06/01/2008  T:  06/02/2008  Job:  [id number]

## 2011-04-27 NOTE — Procedures (Signed)
DUPLEX DEEP VENOUS EXAM - LOWER EXTREMITY   INDICATION:  Small ulcer of right lower extremity second digit.   HISTORY:  Edema:  Yes.  Trauma/Surgery:  Question ingrown toenail now with infection.  Pain:  Yes.  PE:  No.  Previous DVT:  No.  Anticoagulants:  No.  Other:   DUPLEX EXAM:                CFV   SFV   PopV  PTV    GSV                R  L  R  L  R  L  R   L  R  L  Thrombosis    0  0  0     0     0      0  Spontaneous   +  +  +     +     +      +  Phasic        +  +  +     +     +      +  Augmentation  +  +  +     +     +      +  Compressible  +  +  +     +     +      +  Competent     0     0     +     +      +   Legend:  + - yes  o - no  p - partial  D - decreased   IMPRESSION:  1. No evidence of deep venous thrombosis or superficial      thrombophlebitis in the right lower extremity.  2. Reflux noted in the right common femoral and superficial femoral      veins.  3. The right greater saphenous vein and lesser saphenous vein are      competent.  4. Left common femoral vein is within normal limits.  5. Of note, there are vascularized enlarged lymph nodes in the right      groin measuring approximately 3.7 cm x 1.8 cm.    _____________________________  Judeth Cornfield. Scot Dock, M.D.   LT/MEDQ  D:  12/21/2010  T:  12/21/2010  Job:  [id number]

## 2011-04-27 NOTE — Discharge Summary (Signed)
Joshua [name], DYAL NO.:  [id number]   MEDICAL RECORD NO.:  [id number]          PATIENT TYPE:  INP   LOCATION:  2025                         FACILITY:  [facility]   PHYSICIAN:  Annita Brod, M.D.DATE OF BIRTH:  Dec 12, 1956   DATE OF ADMISSION:  05/31/2008  DATE OF DISCHARGE:  06/03/2008                               DISCHARGE SUMMARY   CONSULTANTS ON THIS CASE:  Dr. Esmond Plants, orthopedic surgery   PRIMARY CARE PHYSICIAN:  Dr. [name]   DISCHARGE DIAGNOSES:  1. Osteomyelitis of third toe right foot status post toe amputation      and distal metatarsal amputation.  2. Diabetes mellitus, poorly controlled type 1.  3. Morbid obesity.  4. Malignant hypertension.  5. Binge drinking alcohol abuse.   DISCHARGE MEDICATIONS FOR THIS PATIENT:  1. He will resume all of his previous medications.  He is a on blood      pressure medication trial study.  This will continue.  2. Lantus 75 units subcu q.12h. continue.  3. NovoLog sliding scale continue.  4. New medication - Levaquin 500 mg one p.o. daily x10 days.  5. Percocet 5/325 one p.o. q.6h. p.r.n., total #50, no refills.   HOSPITAL COURSE:  The patient is a 55 year old African American male  with past medical history of obesity, diabetes mellitus type 1 poorly  controlled and hypertension who was sent over from his PCP's office  after it was noted that he had some right foot third toe drainage.  The  patient is unsure of when these wounds occurred, but it looked to be  concerning for a wound and possible osteomyelitis.  When he came into  the floor, labs were drawn and he was found to have a normal white count  of 8.68.  An MRI noted osteomyelitis of phalanges third and fourth toes,  interosseous lesion at the base of the third metatarsal concerning for  the possibility of osteomyelitis.  Dr. Veverly Fells from orthopedic surgery  was consulted to evaluate the patient, had a follow-up x-ray repeated  and then  following x-ray results, the patient underwent third toe and  distal metatarsal amputation.  Postoperatively, he was doing well.  By  postoperative day #2 on June 22 he was felt to be medically cleared for  discharge.  Dr. Veverly Fells felt the patient needs daily dressing changes  which home health will be set up for that.  He felt the patient could be  discharged on p.o. antibiotics given the fact his wound cultures were  negative and his white count has remained completely normalized.  The  plan will be for the patient to be set up with home health and have  daily dressing changes.  He will continue on antibiotics p.o. from a  prophylactic sake, and although he did have wound culture, will assume  that there is some prenecrotic staph versus some Gram negative with no  atypicals there.  Plan will be for the patient to follow up with his PCP  in the next 7 days.  His overall disposition is improved.  It was noted  in regards  to his diabetes that his A1c in the office recently was noted  to be 14.  However, because of the possibility the patient may be n.p.o.  for surgery, his Lantus was cut down to 7 units subcu q.12h. and he was  on a controlled meal intake.  During that time, his sugars have been  excellent, anywhere from the 150s to 100s.  I am going to continue at 75  q.12h. because now he is going back to home lifestyle, but I have urged  the patient to have better control of his sugars, which he tells me he  will try to do so.  In regards to his blood pressure, this has remained  stable.  He was on a drug study and these medications were held during  his hospitalization.  He did not require any additional medications for  this.  He also admitted to some heavy binge drinking on weekends of two  cases of beer.  I have advised the patient that he needs to greatly  decrease this for concerns of health issues.  He tells me he will try to  do so.  The patient is being discharged to home.   Discharge diet will be  a low-sodium, carbohydrate-modified diet.  He will follow up with Dr.  Veverly Fells in 7-10 days and PCP in 1 week.      Annita Brod, M.D.  Electronically Signed     SKK/MEDQ  D:  06/03/2008  T:  06/03/2008  Job:  [id number]   cc:   Doran Heater. Veverly Fells, M.D.  Fax: [place]. Alyson Ingles, M.D.  Fax: [phone]

## 2011-04-27 NOTE — H&P (Signed)
Joshua [name], GRAFTON NO.:  [id number]   MEDICAL RECORD NO.:  [id number]          PATIENT TYPE:  INP   LOCATION:  5859                         FACILITY:  [facility]   PHYSICIAN:  Annita Brod, M.D.DATE OF BIRTH:  June 04, 1956   DATE OF ADMISSION:  05/31/2008  DATE OF DISCHARGE:                              HISTORY & PHYSICAL   PRIMARY CARE PHYSICIAN:  Robert A. Alyson Ingles, M.D.   CHIEF COMPLAINT:  A toe ulcer.   HISTORY OF PRESENT ILLNESS:  The patient is a 55 year old African  American male with  past medical history of poorly controlled diabetes  mellitus and obesity, who for the last several weeks has been  complaining of some foot pain.  He is unclear at what point he injured  his toe, but then when he saw his PCP's partner, they were concerned  because the toe appeared necrotic.  This is the third toe on his right  foot. It had been necrotic and foul-smelling.  They were concerned about  the possibility of osteomyelitis and infection and they recommended that  the patient come in as a direct admission.  [facility] were  consulted and the patient came in as a direct admission. Upon arrival to  the floor, he had an MRI done of his right foot, the results of which  are currently pending.  The patient himself is currently doing well.  He  complains of some minor right foot pain, but otherwise he denies any  headaches, vision changes, dysphagia, chest pain, palpitations,  shortness of breath, wheeze, cough, abdominal pain, hematuria, dysuria,  constipation, diarrhea, focal extremity numbness, weakness or pain  described above.   REVIEW OF SYSTEMS:  Otherwise negative.   PAST MEDICAL HISTORY:  1. Obesity.  2. Diabetes mellitus, poorly controlled.   MEDICATIONS:  He is on Lantus 75 units subcu twice a day, Humalog  sliding scale. He is also on blood pressure medicines through a current  ongoing pharmacy drug study, so he does not know the names of  these.   NO KNOWN DRUG ALLERGIES.   SOCIAL HISTORY:  No tobacco or drug use.  He does admit to heavy  drinking, a 12-pack of beer on weekends.   FAMILY HISTORY:  Noncontributory.   PHYSICAL EXAM:  PATIENT'S VITALS:  On admission, temperature 98.5, heart  rate 84, blood pressure 142/79, respirations 20, O2 sat 94% on room air.  GENERAL:  He is alert and oriented x3 in no apparent distress.  HEENT:  Normocephalic atraumatic.  Mucous membranes are moist.  He has no carotid bruits.  HEART:  Regular rate and rhythm, S1,S2.  LUNGS:  Clear to auscultation bilaterally.  ABDOMEN:  Soft, obese, nontender, positive bowel sounds.  EXTREMITIES:  Show no clubbing or cyanosis.  His left leg from  approximately the ankle down is about a 2+ pitting edema with the third  toe on the right foot looking necrotic and foul-smelling, no active  drainage.   LAB WORK:  Sodium 139, potassium 3.7, chloride 104, bicarb 28, BUN 14,  creatinine 1.3, glucose 195.  White count 8.68, H&H 12.2 and  36, MCV of  82, platelet count 207.  The patient's MR of his right foot shows  osteomyelitis of the phalanges of the third and fourth toes, gout,  arthritis and intraosseous lesion at the base of the third metatarsal  likely related to gout, although nonspecific, plantar foot muscular  myositis, nonspecific, probably infectious myositis.   ASSESSMENT/PLAN:  1. Osteomyelitis:  We will consult Orthopedic surgery and put the      patient on IV vancomycin and Zosyn.  2. Diabetes mellitus:  Poorly controlled.  The patient was found in      the office to have an A1c of greater than 14.  We will continue      Lantus plus sliding scale.  3. Obesity.  4. Hypertension, hold patient's blood pressure medications from the      pharmacy studies.  If his blood pressure starts to trend up, we      will need to put him on medicines here.  5. Renal insufficiency, mild.      Annita Brod, M.D.  Electronically Signed      SKK/MEDQ  D:  05/31/2008  T:  05/31/2008  Job:  [id number]   cc:   Herbie [place] A. Alyson Ingles, M.D.  Fax: [phone]

## 2011-04-30 NOTE — H&P (Signed)
[place]. [facility]  Patient:    Joshua [name], Joshua [name] Visit Number: [id number] MRN: [id number]          Service Type: EMS Location: [place] Attending Physician:  Wynetta Fines Dictated by:   Jene Every, M.D. Admit Date:  10/28/2001                           History and Physical  ADMISSION DIAGNOSIS:  Nausea, hyperglycemia.  HISTORY OF PRESENT ILLNESS:  This 55 year old black male diabetic presented to the emergency department x 2 today.  He was initially evaluated and found to have elevated blood glucose and some vague nausea.  The patient was given IV fluids and insulin and had improved slightly and was discharged to home.  The patients symptoms did not subside, so the patient came back to the emergency department.  It seems the patient has had approximately three to four days of protracted nausea.  The patient had emesis x 2 today.  The patient was actually improving but, once he tried to drink some fluids, the patient began to feel more nauseated and had an episode of vomiting.  The patient was unable to recheck his blood sugars, which had been running high despite compliance per the patient with his insulin regimen.  PAST MEDICAL HISTORY:  Diabetes mellitus.  MEDICATIONS: 1. Avandia 4 mg p.o. q.d. 2. Amaryl 2 mg p.o. q.d. 3. Insulin 70/30, 20 units subcutaneous b.i.d. 4. Amitriptyline 25 mg p.o. q.h.s.  FAMILY HISTORY:  Noncontributory.  SOCIAL HISTORY:  The patient is married, lives with his wife.  No tobacco or alcohol.  REVIEW OF SYSTEMS:  As above.  The patient denies any chest pain.  The patient denies any difficulty breathing.  The patient denies any fever or chills currently.  The patient denies any diarrhea or constipation.  The patient denies melena, hematochezia, hematemesis.  PHYSICAL EXAMINATION:  VITAL SIGNS:  Temperature 98.3, blood pressure 126/83, pulse 91, O2 saturation 97%.  GENERAL:  Middle-aged man in no obvious  distress.  HEENT:  Normocephalic.  Pupils are equal, round, and reactive to light.  No papilledema.  TMs clear.  HEART:  Regular rhythm.  No murmurs, rubs, or gallops.  LUNGS:  Clear bilaterally.  ABDOMEN:  Soft, nontender, nondistended.  Positive bowel sounds.  EXTREMITIES:  No clubbing, cyanosis, or edema.  NEUROLOGIC:  Cranial nerves intact.  Strength 5/5 in all extremities.  ASSESSMENT AND PLAN: 1. Hyperglycemia:  The patients glucose is severely elevated.  The cause of    this is not obvious at this time.  The patient states that he is compliant    with his medications.  Will resume them here in the hospital.  The patient    will be given sensitive sliding scale insulin.  The patient may need    further adjustment to his regimen. 2. Signs and symptoms of abdominal pain and nausea:  It is not clear exactly    what is causing the patients symptoms.  He has a benign exam.  Plain films    this morning were unremarkable.  The patient has no fever or white count    from earlier laboratories.  Will repeat his laboratories in the a.m.    Overall, the patient seems to be stable hemodynamically.  As I said, the    patients exam is benign; positive bowel sounds, nontender.  I was able to    manipulate the patients abdomen during exam without  any complaints.  The    patient will be given Phenergan to help with nausea.  My suspicion is that    his elevated glucose may be causing the patient to feel so poorly.  As I    said, this will be treated with IV fluids and Regular Insulin.  Will follow    this closely. Dictated by:   Jene Every, M.D. Attending Physician:  Wynetta Fines DD:  10/28/01 TD:  10/29/01 Job: [id number]

## 2011-04-30 NOTE — Assessment & Plan Note (Signed)
[facility]   NAME:  Joshua [name], Joshua [name] NO.:  [id number]   MEDICAL RECORD NO.:  [id number]      DATE OF BIRTH:  11/14/1956   PHYSICIAN:  Joshua [name], M.D. VISIT DATE:  07/08/2006                                     OFFICE VISIT   PURPOSE OF TODAY'S VISIT:  Review of wound on the plantar aspect of his left  third toe.   WOUND HISTORY:  Joshua [name] tells me that over the last two to three months  he noticed a wound that is involving the plantar aspect of his left third  toe.  This may have happened with toenail clipping and in fact his nail on  that toe has become hyperkeratotic with thickening of the skin.  It has  appeared to him that these have almost merged.  He went to his primary care  doctor a few weeks ago because of concerns of infection and received a two-  week course of an oral cephalosporin.  This did not seem to make any  difference.  Some of the hypertrophic skin was also clipped in the  physician's office and he has been referred here for review of this.   The patient is a type 2 diabetic, on insulin currently.  He also has  hypertension.  He had a wound on the left foot some years ago, caused by a  toenail clipping as well, though has healed.  He works for long periods of  time on his feet in [employer] of the [employer].  He wears work  boots in this environment.  He does not admit to any sensory loss or  claudication.   PHYSICAL EXAMINATION:  On the plantar aspect of the left third toe there was  a wound with a considerable amount of devitalized hyperkeratotic skin around  this.  In fact, this almost merged with his nail, to make it hard to tell  the difference.  His peripheral pulse on the right side, dorsalis pedis, was  actually strong and actually stronger than the left.  Sensory exam to light  touch appeared to be intact.   With regards to the wound, there was no evidence of any drainage or  infection;  however, the toe itself was quite swollen and there was some  erythema at the base of the third toe.   IMPRESSION/TREATMENT:  Diabetic foot wound, which is probably mostly due to  neuropathy, in spite of my normal sensory examination.  There also could be  an ischemic component here.  I am concerned about ongoing infection, given  the swelling of the digit and the erythema at the base of the toe.  I note  the two-week antibiotic course was completed two weeks ago.  The area itself  was considerably debrided, which resulted in almost doubling in size of the  length of the wound and almost a 50% increase of the width of the wound.  I  actually think there is considerably more devitalized hyperkeratotic skin  that is going to need to be removed at a subsequent visit.  We treated this  with topical Bactroban 2% b.i.d. and put him on doxycycline for two weeks.  He will receive an x-ray of  the foot, to rule out osteomyelitis.  A CBC,  differential and sedimentation rate was ordered.  He was given a  Darko/rocker boot for pressure release and off-loading.  The wound itself  was given Bactroban and a pressure-relieving dressing.  The whole toe was  wrapped.  Instructions were written for the patient's wife to help him  change this daily at home.   He will be seen in the clinic in one week's time.  As mentioned, I think  there is actually a considerable amount of devitalized skin that will need  to be removed here.  I think the wound is actually larger than what we  currently appreciate.  Hopefully by that time the x-rays and blood work will  be back.      Ricard Dillon, M.D.  Electronically Signed     MGR/MEDQ  D:  07/08/2006  T:  07/08/2006  Job:  [id number]

## 2011-04-30 NOTE — Assessment & Plan Note (Signed)
[facility]   NAME:  Joshua [name], WHETSEL NO.:  [id number]   MEDICAL RECORD NO.:  [id number]      DATE OF BIRTH:  1955-12-16   PHYSICIAN:  Ricard Dillon, M.D.      VISIT DATE:                                     OFFICE VISIT   Temperature 98.1, pulse 74, respirations 20, blood pressure 142/96.   The patient is a gentleman who presented with a Wagner's grade 2 ulcer of  his right third toe.  This was associated with tremendous callus.  He  returns today in follow up.   Wound exam:  There, again, is a considerable amount of hyperkeratotic tissue  over the third toe.  This was partial thickness debridement.  However, once  again, the original ulcer has completely healed.  The wound since last visit  is improved and the infection is also improved.  The area of erythema that  was spreading up his foot at the base of the third toe is gone.  There is  still some discoloration of the right third toe, however, no current  evidence of infection.   DIAGNOSIS:  Wagner's grade 2 ulcer of the right third toe with associated  osteomyelitis and cellulitis.  The infection part of this is improved.  He  has two more weeks of Doxycycline and he will complete this.  Tissue  debridement, partial thickness, of hyperkeratotic skin around the base of  the third toe.   MANAGEMENT PLAN AND GOAL:  He is going for custom made diabetic foot wear.  He is to complete another two week of the Doxycycline.  I will see him again  in two weeks time.  At that point, I think he can be discharged.      Ricard Dillon, M.D.     MGR/MEDQ  D:  08/12/2006  T:  08/12/2006  Job:  [id number]

## 2011-04-30 NOTE — Assessment & Plan Note (Signed)
[facility]   NAME:  Joshua [name], Joshua [name] NO.:  [id number]   MEDICAL RECORD NO.:  [id number]      DATE OF BIRTH:  [birth year]   PHYSICIAN:  Ricard Dillon, M.D.      VISIT DATE:                                     OFFICE VISIT   VITAL SIGNS:   PURPOSE OF TODAY'S VISIT:  Followup of diabetic ulcer on the right third  toe.   This is a patient who has a diabetic ulcer on the plantar surface of his  right third toe.  This has been followed in this clinic.  X-rays done  initially suggested the presence of osteomyelitis, which fit clinically with  infection that involved the toe and also the base of the third toe.  He has  been started on doxycycline.  He has been using Bactroban, and the toe has  been offloaded with a pressure-relief shoe provided in this clinic.  He has  done well.  The wound is closing over.   WOUND EXAM:  By inspection, there is considerably less erythema and pain in  the toe and also at the base of the toe, suggesting effectiveness of the  doxycycline.  Unfortunately, he has not been taking the doxycycline for the  last three days and has misplaced the prescription, which I have rewritten  today.  There is still a large amount of hyperkeratotic tissue, which I  partial-thickness debrided today.  There was no bleeding, no odor, no  drainage, and no other evidence of infection.   WOUND SINCE LAST VISIT:  It has improved, and his infection has also  improved.  He is telling me that there is some discomfort present over the  last day, which I think is probably because he has not been taking his  antibiotics.   CHANGE IN INTERVAL MEDICAL HISTORY:   DIAGNOSIS:   TREATMENT:   ANESTHETIC USED:   TISSUE DEBRIDED:  Partial thickness of hyperkeratotic skin around the base  of the third toe.   LEVEL:   CHANGE IN MEDS:   COMPRESSION BANDAGE:   OTHER:   MANAGEMENT PLAN & GOAL:  Doxycycline for another month.  He had been  given a  previous prescription for diabetic footwear.  At this point, I think that  the topical Bactroban can be stopped.  He will need to continue to offload  the pressure on this area.   We will see him again in two weeks.      Ricard Dillon, M.D.  Electronically Signed     MGR/MEDQ  D:  07/29/2006  T:  07/29/2006  Job:  [id number]

## 2011-04-30 NOTE — Discharge Summary (Signed)
[place]. [facility]  Patient:    Joshua [name], Joshua [name] Visit Number: [id number] MRN: [id number]          Service Type: MED Location: [id number] Attending Physician:  Nils Flack Dictated by:   Jene Every, M.D. Admit Date:  10/28/2001 Discharge Date: 10/30/2001                             Discharge Summary  DISCHARGE DIAGNOSIS:  Intractable nausea and vomiting.  HISTORY OF PRESENT ILLNESS:  Please see History and Physical.  HOSPITAL COURSE:  [name] presented to the emergency department after having severe nausea and vomiting.  The patient was found to have a severely elevated glucose which may have been the cause or complication to his severe nausea and vomiting.  The patient was placed on sliding scale insulin and given IV antiemetics and aggressive hydration.  The patient had no other remarkable findings on his exam.  He was afebrile.  After a short time of IV hydration and IV antiemetics, the patient improved.  He was eating without any problems and was hemodynamically stable.  He was discharged in approximately 24 hours.  DISCHARGE MEDICATIONS: 1. 70/30 insulin 20 units b.i.d. 2. Avandia 4 mg p.o. q.d. 3. Amaryl 2 mg p.o. q.d. 4. Mavik 2 mg p.o. q.d. 5. HCTZ 25 mg q.d. 6. Amitriptyline 50 mg p.o. q.h.s. 7. Aspirin q.d.  CONDITION ON DISCHARGE:  Improved.Dictated by:   Jene Every, M.D.  Attending Physician:  Nils Flack DD:  01/12/02 TD:  01/12/02 Job: [id number]

## 2011-04-30 NOTE — Assessment & Plan Note (Signed)
[facility]   NAME:  Joshua [name], Joshua [name] NO.:  [id number]   MEDICAL RECORD NO.:  [id number]      DATE OF BIRTH:  Apr 29, 1956   PHYSICIAN:  Ricard Dillon, M.D. VISIT DATE:  07/15/2006                                     OFFICE VISIT   VITAL SIGNS:  Temperature is 98.4, pulse 60, respirations 16, blood pressure  130/80.   PURPOSE OF TODAY'S VISIT:  Review of diabetic foot ulcer on the plantar  aspect of the right third toe.   This patient was seen here last week. He had a diabetic ulcer on the plantar  surface of his right third toe. This had a tremendous amount of surrounding  hyperkeratotic tissue almost making it impossible to separate his remaining  nail from the hyperkeratotic tissue. This was debrided. The ulcer was opened  and exposed to clean granulation tissue. He has been using Bactroban and  covering the toe. He was also given a shoe to offload the pressure on the  right toe. He was felt to have good circulation however there was erythema  and pain extending down the toe into the base of his third toe. He had an x-  ray done which did suggest the presence of osteomyelitis. He had already  been started on doxicycline.   WOUND EXAM:  By inspection, there is considerably less erythema and pain in  the toe and also at the base of the toe. Suggesting that the doxicycline has  been effective. The wound itself is largely epithelialized. I was surprised  how well this had done. The area around the toe underwent a full-thickness  debridement for a large amount of hyperkeratotic tissue. This was sharp  debrided extensively around the base of the third toe. He tolerated this  procedure well. There was no bleeding. There was no other evidence of  infection   WOUND SINCE LAST VISIT:  Not given.   CHANGE IN INTERVAL MEDICAL HISTORY:  Not given.   DIAGNOSIS:  Diabetic foot ulcer complicated by diabetic osteomyelitis. He  will need to  continue the doxicycline for a full additional 5 weeks to give  6 weeks of doxicycline therapy. The evidence of infection here as mentioned  was already improved. With regards to the wound he is to continue the  Bactroban to the wound which is largely closing over. He is to continue to  offload this. He was given a prescription for diabetic footwear. It is a bit  odd the location of this chronic pressure being on the third toe however the  patient stated that once he started wearing proper footwear he felt the  difference in terms of pressure locations to other areas of his foot. He is  to return in clinic in 2 weeks' time.   TREATMENT:  Not given.   ANESTHETIC USED:  Not given.   TISSUE DEBRIDED:  Not given.   LEVEL:  Not given.   CHANGE IN MEDS:  Not given.   COMPRESSION BANDAGE:  Not given.   OTHER:  Not given.   MANAGEMENT PLAN & GOAL:  Not given.      Ricard Dillon, M.D.  Electronically Signed     MGR/MEDQ  D:  07/15/2006  T:  07/15/2006  Job:  [id number]

## 2011-04-30 NOTE — Assessment & Plan Note (Signed)
[facility]   NAME:  Joshua [name], Joshua [name] NO.:  [id number]   MEDICAL RECORD NO.:  [id number]            DATE OF BIRTH:   PHYSICIAN:  Ricard Dillon, M.D. VISIT DATE:  08/26/2006                                     OFFICE VISIT   LOCATION:  [place].   PURPOSE OF VISIT:  Followup diabetic neuropathic ulcer of his right third  toe with secondary osteomyelitis.   HISTORY OF PRESENT ILLNESS:  We have been following this gentleman who had a  very significant diabetic ulcer on the plantar aspect of his right third  toe. This was associated with a spreading infection towards the base of the  toe and underlying osteomyelitis. He is completing a course of doxycycline  for 6 weeks, which he has tolerated well and the toe looks considerably  better. We have been off-loading this with a Darco sandal. We have provided  prescriptions for diabetic shoes, although he has not had a chance to pick  those up yet. He will have them by the first of next month.   WOUND EXAMINATION:  By inspection, there is continued improvement in this  entire area. The wound is completely healed and has been declared healed  today. He has developed between the right fourth and fifth toes, a macerated  area. I removed the skin. This left a small wound underneath. I wondered  whether this was tinea and put some ketoconazole on this with a covering  dressing.   Wound since last visit, the wound on his right third toe is resolved. He  has, what I think, is a tinea infection between his fourth and fifth toes. I  have advised him to get over-the-counter cream for this. Clean, dry, and  apply b.i.d. In the meantime, he is to continue with his pressure-relief  Darco sandal, pending his diabetic foot wear.   DEBRIDEMENT:  There was partial thickness debridement around the base of the  third toe and also in the web space of the right fourth and fifth toes.      ______________________________  Ricard Dillon, M.D.     MGR/MEDQ  D:  08/26/2006  T:  08/26/2006  Job:  [id number]

## 2011-09-09 LAB — CBC
HCT: 36 — ABNORMAL LOW
HCT: 38.8 — ABNORMAL LOW
HCT: 38.9 — ABNORMAL LOW
Hemoglobin: 12.2 — ABNORMAL LOW
Hemoglobin: 12.8 — ABNORMAL LOW
Hemoglobin: 13
MCHC: 32.9
MCHC: 33.5
MCHC: 33.8
MCV: 82.5
MCV: 83.1
MCV: 83.8
Platelets: 207
Platelets: 224
Platelets: 248
RBC: 4.36
RBC: 4.64
RBC: 4.67
RDW: 14.1
RDW: 14.3
RDW: 14.3
WBC: 7.2
WBC: 7.3
WBC: 8.6

## 2011-09-09 LAB — BASIC METABOLIC PANEL
BUN: 13
BUN: 9
CO2: 29
CO2: 30
Calcium: 8.7
Calcium: 8.7
Chloride: 105
Chloride: 98
Creatinine, Ser: 0.98
Creatinine, Ser: 1.07
GFR calc Af Amer: >60
GFR calc Af Amer: >60
GFR calc non Af Amer: >60
GFR calc non Af Amer: >60
Glucose, Bld: 238 — ABNORMAL HIGH
Glucose, Bld: 70
Potassium: 3.6
Potassium: 3.7
Sodium: 137
Sodium: 141

## 2011-09-09 LAB — ANAEROBIC CULTURE: Gram Stain: NONE SEEN

## 2011-09-09 LAB — BASIC METABOLIC PANEL WITH GFR
BUN: 14
CO2: 28
Calcium: 8.7
Chloride: 104
Creatinine, Ser: 1.32
GFR calc non Af Amer: 58 — ABNORMAL LOW
Glucose, Bld: 195 — ABNORMAL HIGH
Potassium: 3.7
Sodium: 139

## 2011-09-09 LAB — GRAM STAIN

## 2011-09-09 LAB — DIFFERENTIAL
Basophils Absolute: 0
Basophils Relative: 0
Eosinophils Absolute: 0.1
Eosinophils Relative: 1
Lymphocytes Relative: 24
Lymphs Abs: 1.7
Monocytes Absolute: 0.9
Monocytes Relative: 13 — ABNORMAL HIGH
Neutro Abs: 4.5
Neutrophils Relative %: 63

## 2011-09-09 LAB — WOUND CULTURE: Gram Stain: NONE SEEN

## 2011-10-05 NOTE — H&P (Signed)
  NAMELYALL, FACIANE NO.:  [id number]  MEDICAL RECORD NO.:  [id number]          PATIENT TYPE:  REC  LOCATION:  FOOT                         FACILITY:  [facility]  PHYSICIAN:  Elesa Hacker, M.D.        DATE OF BIRTH:  August 26, 1956  DATE OF ADMISSION:  12/15/2010 DATE OF DISCHARGE:                             HISTORY & PHYSICAL   CHIEF COMPLAINT:  Ulceration of right second toe.  HISTORY OF PRESENT ILLNESS:  This is a 55 year old male diabetic for 20 years.  He is status post amputation of the right third toe approximately 2 years ago.  He noticed an ulceration on the tip of the right second toe approximately 10 days ago.  He squeezed the toe and some pus was expressed.  He has a long history of relative neuropathy.  PAST MEDICAL HISTORY:  Positive for hypertension, some confinement anxiety.  He is taking long and short-acting insulin.  ALLERGIES:  None.  MEDICATIONS: 1. Exforge. 2. Atenolol. 3. Allopurinol. 4. Doxycycline 100 b.i.d. 5. Humalog. 6. Lantus.  CIGARETTES:  None.  ALCOHOL:  Two beers per day.  REVIEW OF SYSTEMS:  Otherwise negative.  PHYSICAL EXAMINATION:  VITAL SIGNS:  Temperature 99.2, pulse 70, respirations 16, blood pressure 157/81, and blood glucose is 122. HEAD, EYES, EARS, NOSE, AND THROAT:  Normal. NECK:  Supple. CHEST:  Clear. HEART:  Regular rhythm. ABDOMEN:  Obese and benign. EXTREMITIES:  Good skin nutrition.  There is some edema of the ankle and dorsum of the foot.  The right second toe has a full-thickness callus which almost goes down to bone which was sharply debrided.  Cultures were taken.  There are peripheral pulses palpable but they are relatively weak.  The ABI is 0.7.  PLAN:  Vascular studies, x-ray toe to rule out osteomyelitis, and treat the patient now with Santyl and hydrogel.  We will see him in 7 days.  I believe he is going to be a hyperbaric oxygen candidate and that danger of amputation to this toe is  imminent. Arneta Cliche, M.D.     RA/MEDQ  D:  12/15/2010  T:  12/16/2010  Job:  [id number]  Electronically Signed by Elesa Hacker M.D. on 10/05/2011 08:47:22 AM

## 2011-10-30 ENCOUNTER — Emergency Department (HOSPITAL_COMMUNITY)
Admission: EM | Admit: 2011-10-30 | Discharge: 2011-10-30 | Disposition: A | Discharge status: Home / Self Care | Payer: BC Managed Care – PPO | Attending: Emergency Medicine | Admitting: Emergency Medicine

## 2011-10-30 ENCOUNTER — Emergency Department (HOSPITAL_COMMUNITY): Payer: BC Managed Care – PPO

## 2011-10-30 DIAGNOSIS — R079 Chest pain, unspecified: Secondary | ICD-10-CM | POA: Insufficient documentation

## 2011-10-30 DIAGNOSIS — R112 Nausea with vomiting, unspecified: Secondary | ICD-10-CM | POA: Insufficient documentation

## 2011-10-30 HISTORY — DX: Essential (primary) hypertension: I10

## 2011-10-30 LAB — CBC
HCT: 39.5 % (ref 39.0–52.0)
Hemoglobin: 13.4 g/dL (ref 13.0–17.0)
MCH: 29.3 pg (ref 26.0–34.0)
MCHC: 33.9 g/dL (ref 30.0–36.0)
MCV: 86.4 fL (ref 78.0–100.0)
Platelets: 180 10*3/uL (ref 150–400)
RBC: 4.57 MIL/uL (ref 4.22–5.81)
RDW: 13.9 % (ref 11.5–15.5)
WBC: 6.9 10*3/uL (ref 4.0–10.5)

## 2011-10-30 LAB — BASIC METABOLIC PANEL
BUN: 18 mg/dL (ref 6–23)
CO2: 22 mEq/L (ref 19–32)
Calcium: 9.3 mg/dL (ref 8.4–10.5)
Chloride: 101 mEq/L (ref 96–112)
Creatinine, Ser: 1.14 mg/dL (ref 0.50–1.35)
GFR calc Af Amer: 83 mL/min — ABNORMAL LOW (ref >90)
GFR calc non Af Amer: 72 mL/min — ABNORMAL LOW (ref >90)
Glucose, Bld: 157 mg/dL — ABNORMAL HIGH (ref 70–99)
Potassium: 3.4 mEq/L — ABNORMAL LOW (ref 3.5–5.1)
Sodium: 138 mEq/L (ref 135–145)

## 2011-10-30 LAB — TROPONIN I
Troponin I: <0.3 ng/mL (ref <0.30)
Troponin I: <0.3 ng/mL (ref <0.30)

## 2011-10-30 LAB — POCT I-STAT TROPONIN I: Troponin i, poc: 0.07 ng/mL (ref 0.00–0.08)

## 2011-10-30 MED ORDER — ESOMEPRAZOLE MAGNESIUM 40 MG PO CPDR: 40.0000 mg | DELAYED_RELEASE_CAPSULE | Freq: Every day | ORAL | Status: DC

## 2011-10-30 MED ORDER — METOCLOPRAMIDE HCL 10 MG PO TABS: 10.0000 mg | ORAL_TABLET | Freq: Four times a day (QID) | ORAL | Status: DC

## 2011-10-30 MED ORDER — GI COCKTAIL ~~LOC~~
30.0000 mL | Freq: Once | ORAL | Status: AC
  Administered 2011-10-30: 30 mL via ORAL
  Filled 2011-10-30: qty 30

## 2011-10-30 NOTE — ED Notes (Signed)
Pt reports having left sided chest pain, intermittent "last about 5-10 minutes". Pt report pain is uncomfortable "like my heart feels tired". Pt states "it seems like urinating ease the pain", with dizziness "unsteady", nausea and vomiting early today. Pt denies any n/v/d at this time, pt denies any chest pain, shortness of breath or any complaints at this time.

## 2011-10-30 NOTE — Discharge Instructions (Signed)
Chest Pain (Nonspecific) It is often hard to give a specific diagnosis for the cause of chest pain. There is always a chance that your pain could be related to something serious, such as a heart attack or a blood clot in the lungs. You need to follow up with your caregiver for further evaluation. CAUSES   Heartburn.   Pneumonia or bronchitis.   Anxiety and stress.   Inflammation around your heart (pericarditis) or lung (pleuritis or pleurisy).   A blood clot in the lung.   A collapsed lung (pneumothorax). It can develop suddenly on its own (spontaneous pneumothorax) or from injury (trauma) to the chest.  The chest wall is composed of bones, muscles, and cartilage. Any of these can be the source of the pain.  The bones can be bruised by injury.   The muscles or cartilage can be strained by coughing or overwork.   The cartilage can be affected by inflammation and become sore (costochondritis).  DIAGNOSIS  Lab tests or other studies, such as X-rays, an EKG, stress testing, or cardiac imaging, may be needed to find the cause of your pain.  TREATMENT   Treatment depends on what may be causing your chest pain. Treatment may include:   Acid blockers for heartburn.   Anti-inflammatory medicine.   Pain medicine for inflammatory conditions.   Antibiotics if an infection is present.   You may be advised to change lifestyle habits. This includes stopping smoking and avoiding caffeine and chocolate.   You may be advised to keep your head raised (elevated) when sleeping. This reduces the chance of acid going backward from your stomach into your esophagus.   Most of the time, nonspecific chest pain will improve within 2 to 3 days with rest and mild pain medicine.  HOME CARE INSTRUCTIONS   If antibiotics were prescribed, take the full amount even if you start to feel better.   For the next few days, avoid physical activities that bring on chest pain. Continue physical activities as  directed.   Do not smoke cigarettes or drink alcohol until your symptoms are gone.   Only take over-the-counter or prescription medicine for pain, discomfort, or fever as directed by your caregiver.   Follow your caregiver's suggestions for further testing if your chest pain does not go away.   Keep any follow-up appointments you made. If you do not go to an appointment, you could develop lasting (chronic) problems with pain. If there is any problem keeping an appointment, you must call to reschedule.  SEEK MEDICAL CARE IF:   You think you are having problems from the medicine you are taking. Read your medicine instructions carefully.   Your chest pain does not go away, even after treatment.   You develop a rash with blisters on your chest.  SEEK IMMEDIATE MEDICAL CARE IF:   You have increased chest pain or pain that spreads to your arm, neck, jaw, back, or belly (abdomen).   You develop shortness of breath, an increasing cough, or you are coughing up blood.   You have severe back or abdominal pain, feel sick to your stomach (nauseous) or throw up (vomit).   You develop severe weakness, fainting, or chills.   You have an oral temperature above 102 F (38.9 C), not controlled by medicine.  THIS IS AN EMERGENCY. Do not wait to see if the pain will go away. Get medical help at once. Call your local emergency services (911 in [place] Do not drive yourself to   the hospital. MAKE SURE YOU:   Understand these instructions.   Will watch your condition.   Will get help right away if you are not doing well or get worse.  Document Released: 09/08/2005 Document Revised: 08/11/2011 Document Reviewed: 07/04/2008 [facility] Patient Information 2012 [place], [place].  Aspirin and Your Heart Aspirin affects the way your blood clots and helps "thin" the blood. Aspirin has many uses in heart disease. It may be used as a primary prevention to help reduce the risk of heart related events. It also can  be used as a secondary measure to prevent more heart attacks or to prevent additional damage from blood clots.  ASPIRIN MAY HELP IF YOU:  Have had a heart attack or chest pain.   Have undergone open heart surgery such as CABG (Coronary Artery Bypass Surgery).   Have had coronary angioplasty with or without stents.   Have experienced a stroke or TIA (transient ischemic attack).   Have peripheral vascular disease (PAD).   Have chronic heart rhythm problems such as atrial fibrillation.   Are at risk for heart disease.  BEFORE STARTING ASPIRIN Before you start taking aspirin, your caregiver will need to review your medical history. Many things will need to be taken into consideration, such as:  Smoking status.   Blood pressure.   Diabetes.   Gender.   Weight.   Cholesterol level.  ASPIRIN DOSES  Aspirin should only be taken on the advice of your caregiver. Talk to your caregiver about how much aspirin you should take. Aspirin comes in different doses such as:   81 mg.   162 mg.   325 mg.   The aspirin dose you take may be affected by many factors, some of which include:   Your current medications, especially if your are taking blood-thinners or anti-platelet medicine.   Liver function.   Heart disease risk.   Age.   Aspirin comes in two forms:   Non-enteric-coated. This type of aspirin does not have a coating and is absorbed faster. Non-enteric coated aspirin is recommended for patients experiencing chest pain symptoms. This type of aspirin also comes in a chewable form.   Enteric-coated. This means the aspirin has a special coating that releases the medicine very slowly. Enteric-coated aspirin causes less stomach upset. This type of aspirin should not be chewed or crushed.  ASPIRIN SIDE EFFECTS Daily use of aspirin can increase your risk of serious side effects, some of these include:  Increased bleeding. This can range from a cut that does not stop bleeding to  more serious problems such as stomach bleeding or bleeding into the brain (Intracerebral bleeding).   Increased bruising.   Stomach upset.   An allergic reaction such as red, itchy skin.   Increased risk of bleeding when combined with non-steroidal anti-inflammatory medicine (NSAIDS).   Alcohol should be drank in moderation when taking aspirin. Alcohol can increase the risk of stomach bleeding when taken with aspirin.   Aspirin should not be given to children less than 39 years of age due to the association of Reye syndrome. Reye syndrome is a serious illness that can affect the brain and liver. Studies have linked Reye syndrome with aspirin use in children.   People that have nasal polyps have an increased risk of developing an aspirin allergy.  SEEK MEDICAL CARE IF:   You develop an allergic reaction such as:   Hives.   Itchy skin.   Swelling of the lips, tongue or face.   You  develop stomach pain.   You have unusual bleeding or bruising.   You have ringing in your ears.  SEEK IMMEDIATE MEDICAL CARE IF:   You have severe chest pain, especially if the pain is crushing or pressure-like and spreads to the arms, back, neck, or jaw. THIS IS AN EMERGENCY. Do not wait to see if the pain will go away. Get medical help at once. Call your local emergency services (911 in the [place] DO NOT drive yourself to the hospital.   You have stroke-like symptoms such as:   Loss of vision.   Difficulty talking.   Numbness or weakness on one side of your body.   Numbness or weakness in your arm or leg.   Not thinking clearly or feeling confused.   Your bowel movements are bloody, dark red or black in color.   You vomit or cough up blood.   You have blood in your urine.   You have shortness of breath, coughing or wheezing.  MAKE SURE YOU:   Understand these instructions.   Will monitor your condition.   Seek immediate medical care if necessary.  Document Released: 11/11/2008  Document Revised: 08/11/2011 Document Reviewed: 11/11/2008 [facility] Patient Information 2012 [place], [place].

## 2011-10-30 NOTE — ED Notes (Signed)
Given snack.

## 2011-10-30 NOTE — ED Provider Notes (Signed)
History     CSN: [id number] Arrival date & time: 10/30/2011  2:06 AM   First MD Initiated Contact with Patient 10/30/11 [phone]      Chief Complaint  Patient presents with  . Chest Pain    (Consider location/radiation/quality/duration/timing/severity/associated sxs/prior treatment) HPI Comments: Patient has no prior history of coronary disease. He reports that he's had a sensation in his left chest intermittently for about 3 weeks that he was attributing to gastroparesis. Patient reports that he used to have gastroparesis for which he was taking medication although has not bothered him recently. He denies the sensation feeling like reflux or heartburn. She denies any significant shortness of breath. He reports that it has not been worsening with physical activity or exertion. Tonight he had eaten pizza at about 4 PM and at 5 PM he noticed the same sensation in his left chest area. It did not radiate. It was not associated with sweating or nausea. It lasted only about 10 minutes as it normally does. He reports it is not painful. He reports that he works in [employer] and after a performance he was at home this evening and had eaten a large dinner including beans, ribs, barbecue. He reports that this time discomfort was actually present in steadily increased rapidly and he became more more worried. He was worried that the symptoms that he had been neglecting over the last 3 weeks but indicated that he is having a heart attack. He reports that he stated speak to the physician on call for his primary care physician Dr. Natividad Brood suggested that he come to the emergency department. He reports he did have one episode of nausea and vomiting and shortly thereafter his symptoms seem to have resolved and has not had any recurrence of his symptoms since arrival to the emergency department. He reports he has had a stress test many years ago which was normal. He does have a significant history of hypertension, diabetes  and high cholesterol. He is a nonsmoker. He reports he knows of one aunt that had a heart attack at a young age. Otherwise he does not know much about his father or mother who died at a young age of unknown cause.    Patient is a 55 y.o. male presenting with chest pain. The history is provided by the patient and the spouse.  Chest Pain Primary symptoms include nausea and vomiting. Pertinent negatives for primary symptoms include no shortness of breath.     Past Medical History  Diagnosis Date  . Diabetes mellitus   . Hypertension     History reviewed. No pertinent past surgical history.  No family history on file.  History  Substance Use Topics  . Smoking status: Never Smoker   . Smokeless tobacco: Not on file  . Alcohol Use: 10.8 oz/week    18 Cans of beer per week      Review of Systems  Constitutional: Negative.   Respiratory: Negative for shortness of breath.   Cardiovascular: Positive for chest pain.  Gastrointestinal: Positive for nausea and vomiting.  Skin: Negative for color change.  All other systems reviewed and are negative.    Allergies  Review of patient's allergies indicates no known allergies.  Home Medications   Current Outpatient Rx  Name Route Sig Dispense Refill  . ALLOPURINOL 300 MG PO TABS Oral Take 300 mg by mouth daily.      [name] AMLODIPINE-VALSARTAN-HCTZ 10-320-25 MG PO TABS Oral Take 1 tablet by mouth daily.      [name]  ATENOLOL 50 MG PO TABS Oral Take 50 mg by mouth daily.      . ATORVASTATIN CALCIUM 40 MG PO TABS Oral Take 40 mg by mouth daily.      [name] HYDROCODONE-ACETAMINOPHEN 5-500 MG PO TABS Oral Take 1 tablet by mouth. Every four to six hours as needed. For dental pain    . INSULIN ISOPHANE & REGULAR (70-30) 100 UNIT/ML [place] SUSP Subcutaneous Inject 40 Units into the skin 2 (two) times daily.       BP 140/84  Pulse 68  Temp(Src) 98.4 F (36.9 C) (Oral)  Resp 19  Ht $R'5\' 9"'ly$  (1.753 m)  Wt 300 lb (136.079 kg)  BMI 44.30 kg/m2  SpO2  97%  Physical Exam  Constitutional: He is oriented to person, place, and time. He appears well-developed and well-nourished.  HENT:  Head: Normocephalic and atraumatic.  Eyes: Pupils are equal, round, and reactive to light.  Cardiovascular: Normal rate.   Pulmonary/Chest: Effort normal and breath sounds normal.  Abdominal: Soft. There is no tenderness.  Musculoskeletal: Normal range of motion. He exhibits no tenderness.  Neurological: He is alert and oriented to person, place, and time.  Skin: Skin is warm and dry.  Psychiatric: He has a normal mood and affect.    ED Course  Procedures (including critical care time)   Labs Reviewed  CBC  BASIC METABOLIC PANEL  I-STAT TROPONIN I   No results found.   No diagnosis found.  RA sat is 97% and normal.  MDM    ECG time 0211 shows NSR at rate 75, normal axis, normal intervals, poor r wave progression from V2 to V3, and no ST or T wave changes.  No prior ECG's available.    Patient's symptoms are somewhat atypical as they last only about 5 minutes. He does have several risk factors and had one episode of vomiting however given his symptoms completely resolved afterwards suggests that this might have been reflux or some sort of gastrointestinal etiology. He denies any exertional symptoms. He has had a negative stress test in the past. The plan is to rule out for MI with serial troponins. His EKG shows no ongoing ischemia and he remains symptom free. It is troponins are negative I will discuss his care with his primary care physician to ensure proper followup. Patient is in agreement with the plan at this time.      7:12 AM Pt remains pain free,  Serial troponin's negative, ECG was normal.  I spoke to his PCP on call physician to ask that pt get close follow up.  Pt agreeable with plan as is spouse.  Pt is told to return immediately for worsening symptoms, any other concerns.    Saddie Benders. Dorna Mai, MD 10/30/11 [id number]

## 2011-10-30 NOTE — ED Notes (Signed)
Resting now--SO at bedside.

## 2011-10-31 ENCOUNTER — Other Ambulatory Visit: Payer: Self-pay

## 2011-10-31 ENCOUNTER — Encounter (HOSPITAL_COMMUNITY): Payer: Self-pay | Admitting: Emergency Medicine

## 2011-10-31 ENCOUNTER — Emergency Department (HOSPITAL_COMMUNITY): Payer: BC Managed Care – PPO

## 2011-10-31 ENCOUNTER — Encounter (HOSPITAL_COMMUNITY)
Admission: EM | Disposition: A | Discharge status: Home / Self Care | Payer: Self-pay | Source: Home / Self Care | Attending: Cardiovascular Disease

## 2011-10-31 ENCOUNTER — Inpatient Hospital Stay (HOSPITAL_COMMUNITY)
Admission: EM | Admit: 2011-10-31 | Discharge: 2011-11-04 | DRG: 550 | Disposition: A | Discharge status: Home / Self Care | Payer: BC Managed Care – PPO | Attending: Cardiovascular Disease | Admitting: Cardiovascular Disease

## 2011-10-31 DIAGNOSIS — Z955 Presence of coronary angioplasty implant and graft: Secondary | ICD-10-CM

## 2011-10-31 DIAGNOSIS — R079 Chest pain, unspecified: Secondary | ICD-10-CM

## 2011-10-31 DIAGNOSIS — I10 Essential (primary) hypertension: Secondary | ICD-10-CM | POA: Diagnosis present

## 2011-10-31 DIAGNOSIS — E119 Type 2 diabetes mellitus without complications: Secondary | ICD-10-CM | POA: Diagnosis present

## 2011-10-31 DIAGNOSIS — E785 Hyperlipidemia, unspecified: Secondary | ICD-10-CM | POA: Diagnosis present

## 2011-10-31 DIAGNOSIS — M86179 Other acute osteomyelitis, unspecified ankle and foot: Secondary | ICD-10-CM | POA: Diagnosis present

## 2011-10-31 DIAGNOSIS — I219 Acute myocardial infarction, unspecified: Secondary | ICD-10-CM

## 2011-10-31 DIAGNOSIS — I2109 ST elevation (STEMI) myocardial infarction involving other coronary artery of anterior wall: Principal | ICD-10-CM | POA: Diagnosis present

## 2011-10-31 DIAGNOSIS — I213 ST elevation (STEMI) myocardial infarction of unspecified site: Secondary | ICD-10-CM | POA: Diagnosis present

## 2011-10-31 DIAGNOSIS — K219 Gastro-esophageal reflux disease without esophagitis: Secondary | ICD-10-CM | POA: Diagnosis present

## 2011-10-31 DIAGNOSIS — Z794 Long term (current) use of insulin: Secondary | ICD-10-CM

## 2011-10-31 DIAGNOSIS — IMO0001 Reserved for inherently not codable concepts without codable children: Secondary | ICD-10-CM | POA: Diagnosis present

## 2011-10-31 HISTORY — PX: CORONARY ANGIOPLASTY WITH STENT PLACEMENT: SHX49

## 2011-10-31 HISTORY — DX: Hyperlipidemia, unspecified: E78.5

## 2011-10-31 LAB — PROTIME-INR
INR: 1.06 (ref 0.00–1.49)
Prothrombin Time: 14 seconds (ref 11.6–15.2)

## 2011-10-31 LAB — URINE MICROSCOPIC-ADD ON

## 2011-10-31 LAB — COMPREHENSIVE METABOLIC PANEL
ALT: 24 U/L (ref 0–53)
AST: 102 U/L — ABNORMAL HIGH (ref 0–37)
Albumin: 3.3 g/dL — ABNORMAL LOW (ref 3.5–5.2)
Alkaline Phosphatase: 120 U/L — ABNORMAL HIGH (ref 39–117)
BUN: 14 mg/dL (ref 6–23)
CO2: 23 mEq/L (ref 19–32)
Calcium: 9.3 mg/dL (ref 8.4–10.5)
Chloride: 95 mEq/L — ABNORMAL LOW (ref 96–112)
Creatinine, Ser: 0.92 mg/dL (ref 0.50–1.35)
GFR calc Af Amer: >90 mL/min (ref >90)
GFR calc non Af Amer: >90 mL/min (ref >90)
Glucose, Bld: 344 mg/dL — ABNORMAL HIGH (ref 70–99)
Potassium: 4.1 mEq/L (ref 3.5–5.1)
Sodium: 133 mEq/L — ABNORMAL LOW (ref 135–145)
Total Bilirubin: 0.6 mg/dL (ref 0.3–1.2)
Total Protein: 7.3 g/dL (ref 6.0–8.3)

## 2011-10-31 LAB — URINALYSIS, ROUTINE W REFLEX MICROSCOPIC
Bilirubin Urine: NEGATIVE
Glucose, UA: >1000 mg/dL — AB
Ketones, ur: >80 mg/dL — AB
Leukocytes, UA: NEGATIVE
Nitrite: NEGATIVE
Protein, ur: >300 mg/dL — AB
Specific Gravity, Urine: 1.024 (ref 1.005–1.030)
Urobilinogen, UA: 0.2 mg/dL (ref 0.0–1.0)
pH: 6.5 (ref 5.0–8.0)

## 2011-10-31 LAB — DIFFERENTIAL
Basophils Absolute: 0 10*3/uL (ref 0.0–0.1)
Basophils Relative: 0 % (ref 0–1)
Eosinophils Absolute: 0 10*3/uL (ref 0.0–0.7)
Eosinophils Relative: 0 % (ref 0–5)
Lymphocytes Relative: 9 % — ABNORMAL LOW (ref 12–46)
Lymphs Abs: 1 10*3/uL (ref 0.7–4.0)
Monocytes Absolute: 0.5 10*3/uL (ref 0.1–1.0)
Monocytes Relative: 5 % (ref 3–12)
Neutro Abs: 9.3 10*3/uL — ABNORMAL HIGH (ref 1.7–7.7)
Neutrophils Relative %: 86 % — ABNORMAL HIGH (ref 43–77)

## 2011-10-31 LAB — CBC
HCT: 42.9 % (ref 39.0–52.0)
Hemoglobin: 14.9 g/dL (ref 13.0–17.0)
MCH: 29.3 pg (ref 26.0–34.0)
MCHC: 34.7 g/dL (ref 30.0–36.0)
MCV: 84.3 fL (ref 78.0–100.0)
Platelets: 206 10*3/uL (ref 150–400)
RBC: 5.09 MIL/uL (ref 4.22–5.81)
RDW: 13.3 % (ref 11.5–15.5)
WBC: 10.8 10*3/uL — ABNORMAL HIGH (ref 4.0–10.5)

## 2011-10-31 LAB — GLUCOSE, CAPILLARY
Glucose-Capillary: 428 mg/dL — ABNORMAL HIGH (ref 70–99)
Glucose-Capillary: 371 mg/dL — ABNORMAL HIGH (ref 70–99)
Glucose-Capillary: 330 mg/dL — ABNORMAL HIGH (ref 70–99)
Glucose-Capillary: 284 mg/dL — ABNORMAL HIGH (ref 70–99)
Glucose-Capillary: 349 mg/dL — ABNORMAL HIGH (ref 70–99)
Glucose-Capillary: 308 mg/dL — ABNORMAL HIGH (ref 70–99)

## 2011-10-31 LAB — POCT I-STAT TROPONIN I: Troponin i, poc: 8.09 ng/mL (ref 0.00–0.08)

## 2011-10-31 LAB — APTT: aPTT: 32 seconds (ref 24–37)

## 2011-10-31 LAB — MRSA PCR SCREENING: MRSA by PCR: NEGATIVE

## 2011-10-31 LAB — LIPASE, BLOOD: Lipase: 15 U/L (ref 11–59)

## 2011-10-31 LAB — TROPONIN I: Troponin I: 12.59 ng/mL (ref <0.30)

## 2011-10-31 LAB — HEPARIN LEVEL (UNFRACTIONATED): Heparin Unfractionated: 0.17 IU/mL — ABNORMAL LOW (ref 0.30–0.70)

## 2011-10-31 SURGERY — LEFT HEART CATHETERIZATION WITH CORONARY ANGIOGRAM
Anesthesia: LOCAL

## 2011-10-31 MED ORDER — INSULIN ASPART 100 UNIT/ML ~~LOC~~ SOLN
8.0000 [IU] | Freq: Once | SUBCUTANEOUS | Status: AC
  Administered 2011-10-31: 8 [IU] via SUBCUTANEOUS

## 2011-10-31 MED ORDER — ASPIRIN 81 MG PO CHEW
CHEWABLE_TABLET | ORAL | Status: AC
  Administered 2011-10-31: 81 mg
  Filled 2011-10-31: qty 2

## 2011-10-31 MED ORDER — HEPARIN (PORCINE) IN NACL 100-0.45 UNIT/ML-% IJ SOLN
1500.0000 [IU]/h | INTRAMUSCULAR | Status: DC
  Administered 2011-10-31: 1400 [IU]/h via INTRAVENOUS
  Administered 2011-11-01: 1700 [IU]/h via INTRAVENOUS
  Administered 2011-11-02: 1500 [IU]/h via INTRAVENOUS
  Administered 2011-11-02: 1700 [IU]/h via INTRAVENOUS
  Filled 2011-10-31 (×6): qty 250

## 2011-10-31 MED ORDER — INSULIN ASPART PROT & ASPART (70-30 MIX) 100 UNIT/ML ~~LOC~~ SUSP
15.0000 [IU] | Freq: Once | SUBCUTANEOUS | Status: AC
  Administered 2011-10-31: 15 [IU] via SUBCUTANEOUS

## 2011-10-31 MED ORDER — SODIUM CHLORIDE 0.9 % IV SOLN
1.0000 mL/kg/h | INTRAVENOUS | Status: AC
  Administered 2011-10-31: 1 mL/kg/h via INTRAVENOUS

## 2011-10-31 MED ORDER — PANTOPRAZOLE SODIUM 40 MG PO TBEC
40.0000 mg | DELAYED_RELEASE_TABLET | Freq: Every day | ORAL | Status: DC
  Administered 2011-10-31 – 2011-11-03 (×4): 40 mg via ORAL
  Filled 2011-10-31 (×5): qty 1

## 2011-10-31 MED ORDER — ASPIRIN EC 81 MG PO TBEC
81.0000 mg | DELAYED_RELEASE_TABLET | Freq: Every day | ORAL | Status: DC
  Administered 2011-10-31 – 2011-11-03 (×4): 81 mg via ORAL
  Filled 2011-10-31 (×4): qty 1

## 2011-10-31 MED ORDER — TICAGRELOR 90 MG PO TABS
90.0000 mg | ORAL_TABLET | Freq: Two times a day (BID) | ORAL | Status: DC
  Administered 2011-10-31 – 2011-11-03 (×7): 90 mg via ORAL
  Filled 2011-10-31 (×8): qty 1

## 2011-10-31 MED ORDER — HEPARIN SODIUM (PORCINE) 5000 UNIT/ML IJ SOLN
4000.0000 [IU] | INTRAMUSCULAR | Status: AC
  Administered 2011-10-31: 4000 [IU] via INTRAVENOUS
  Filled 2011-10-31: qty 1

## 2011-10-31 MED ORDER — MORPHINE SULFATE 2 MG/ML IJ SOLN: 3.0000 mg | INTRAMUSCULAR | Status: DC | PRN

## 2011-10-31 MED ORDER — NITROGLYCERIN 0.3 MG SL SUBL: 0.3000 mg | SUBLINGUAL_TABLET | SUBLINGUAL | Status: DC | PRN

## 2011-10-31 MED ORDER — LISINOPRIL 5 MG PO TABS
5.0000 mg | ORAL_TABLET | Freq: Every day | ORAL | Status: DC
  Administered 2011-10-31 – 2011-11-03 (×4): 5 mg via ORAL
  Filled 2011-10-31 (×5): qty 1

## 2011-10-31 MED ORDER — BIVALIRUDIN 250 MG IV SOLR
INTRAVENOUS | Status: AC
  Filled 2011-10-31: qty 250

## 2011-10-31 MED ORDER — NITROGLYCERIN 0.2 MG/ML ON CALL CATH LAB
INTRAVENOUS | Status: AC
  Filled 2011-10-31: qty 1

## 2011-10-31 MED ORDER — ACETAMINOPHEN 325 MG PO TABS: 650.0000 mg | ORAL_TABLET | ORAL | Status: DC | PRN

## 2011-10-31 MED ORDER — ASPIRIN 81 MG PO CHEW
162.0000 mg | CHEWABLE_TABLET | Freq: Once | ORAL | Status: AC
  Administered 2011-10-31: 162 mg via ORAL
  Filled 2011-10-31: qty 4

## 2011-10-31 MED ORDER — ALLOPURINOL 300 MG PO TABS
300.0000 mg | ORAL_TABLET | Freq: Every day | ORAL | Status: DC
  Administered 2011-10-31 – 2011-11-04 (×5): 300 mg via ORAL
  Filled 2011-10-31 (×5): qty 1

## 2011-10-31 MED ORDER — SODIUM CHLORIDE 0.9 % IV SOLN: 30.0000 mL | INTRAVENOUS | Status: DC

## 2011-10-31 MED ORDER — NITROGLYCERIN IN D5W 200-5 MCG/ML-% IV SOLN: 2.0000 ug/min | INTRAVENOUS | Status: DC

## 2011-10-31 MED ORDER — METOCLOPRAMIDE HCL 10 MG PO TABS
10.0000 mg | ORAL_TABLET | Freq: Four times a day (QID) | ORAL | Status: DC
  Administered 2011-10-31 – 2011-11-04 (×15): 10 mg via ORAL
  Filled 2011-10-31 (×20): qty 1

## 2011-10-31 MED ORDER — MIDAZOLAM HCL 2 MG/2ML IJ SOLN
INTRAMUSCULAR | Status: AC
  Filled 2011-10-31: qty 2

## 2011-10-31 MED ORDER — METOCLOPRAMIDE HCL 5 MG/ML IJ SOLN
10.0000 mg | Freq: Once | INTRAMUSCULAR | Status: AC
  Administered 2011-10-31: 10 mg via INTRAVENOUS
  Filled 2011-10-31: qty 2

## 2011-10-31 MED ORDER — DIAZEPAM 5 MG PO TABS
5.0000 mg | ORAL_TABLET | ORAL | Status: DC | PRN
  Administered 2011-11-03: 5 mg via ORAL
  Filled 2011-10-31: qty 1

## 2011-10-31 MED ORDER — NITROGLYCERIN IN D5W 200-5 MCG/ML-% IV SOLN
INTRAVENOUS | Status: AC
  Filled 2011-10-31: qty 250

## 2011-10-31 MED ORDER — SODIUM CHLORIDE 0.9 % IV SOLN
Freq: Once | INTRAVENOUS | Status: AC
Start: 2011-10-31 — End: 2011-10-31
  Administered 2011-10-31: via INTRAVENOUS

## 2011-10-31 MED ORDER — SODIUM CHLORIDE 0.9 % IV BOLUS (SEPSIS): 500.0000 mL | Freq: Once | INTRAVENOUS | Status: DC

## 2011-10-31 MED ORDER — NITROGLYCERIN 0.4 MG SL SUBL: 0.4000 mg | SUBLINGUAL_TABLET | SUBLINGUAL | Status: DC | PRN

## 2011-10-31 MED ORDER — INSULIN ASPART PROT & ASPART (70-30 MIX) 100 UNIT/ML ~~LOC~~ SUSP
20.0000 [IU] | Freq: Two times a day (BID) | SUBCUTANEOUS | Status: DC
  Administered 2011-10-31: 20 [IU] via SUBCUTANEOUS
  Filled 2011-10-31: qty 3

## 2011-10-31 MED ORDER — HYDROMORPHONE HCL PF 1 MG/ML IJ SOLN
1.0000 mg | Freq: Once | INTRAMUSCULAR | Status: AC
  Administered 2011-10-31: 1 mg via INTRAVENOUS
  Filled 2011-10-31: qty 1

## 2011-10-31 MED ORDER — INSULIN ASPART 100 UNIT/ML ~~LOC~~ SOLN
0.0000 [IU] | Freq: Three times a day (TID) | SUBCUTANEOUS | Status: DC
  Administered 2011-10-31: 15 [IU] via SUBCUTANEOUS
  Administered 2011-11-01: 11 [IU] via SUBCUTANEOUS
  Administered 2011-11-01: 3 [IU] via SUBCUTANEOUS
  Administered 2011-11-01: 5 [IU] via SUBCUTANEOUS
  Administered 2011-11-02: 11 [IU] via SUBCUTANEOUS
  Administered 2011-11-02: 2 [IU] via SUBCUTANEOUS
  Administered 2011-11-02: 3 [IU] via SUBCUTANEOUS
  Administered 2011-11-04: 2 [IU] via SUBCUTANEOUS
  Filled 2011-10-31: qty 3

## 2011-10-31 MED ORDER — HYDROCODONE-ACETAMINOPHEN 5-325 MG PO TABS
1.0000 | ORAL_TABLET | Freq: Four times a day (QID) | ORAL | Status: DC | PRN
  Administered 2011-11-03: 1 via ORAL
  Filled 2011-10-31: qty 1

## 2011-10-31 MED ORDER — FENTANYL CITRATE 0.05 MG/ML IJ SOLN
INTRAMUSCULAR | Status: AC
  Filled 2011-10-31: qty 2

## 2011-10-31 MED ORDER — HEPARIN (PORCINE) IN NACL 2-0.9 UNIT/ML-% IJ SOLN
INTRAMUSCULAR | Status: AC
  Filled 2011-10-31: qty 2000

## 2011-10-31 MED ORDER — INSULIN ASPART 100 UNIT/ML ~~LOC~~ SOLN
10.0000 [IU] | Freq: Once | SUBCUTANEOUS | Status: AC
  Administered 2011-10-31: 10 [IU] via SUBCUTANEOUS

## 2011-10-31 MED ORDER — METOPROLOL TARTRATE 25 MG PO TABS
25.0000 mg | ORAL_TABLET | Freq: Three times a day (TID) | ORAL | Status: DC
  Administered 2011-10-31 (×3): 25 mg via ORAL
  Filled 2011-10-31 (×6): qty 1

## 2011-10-31 MED ORDER — ASPIRIN 81 MG PO CHEW
162.0000 mg | CHEWABLE_TABLET | Freq: Once | ORAL | Status: AC
  Administered 2011-10-31: 162 mg via ORAL

## 2011-10-31 MED ORDER — INSULIN ASPART 100 UNIT/ML ~~LOC~~ SOLN
4.0000 [IU] | Freq: Once | SUBCUTANEOUS | Status: AC
  Administered 2011-10-31: 4 [IU] via SUBCUTANEOUS
  Filled 2011-10-31: qty 3

## 2011-10-31 MED ORDER — ONDANSETRON HCL 4 MG/2ML IJ SOLN: 4.0000 mg | Freq: Four times a day (QID) | INTRAMUSCULAR | Status: DC | PRN

## 2011-10-31 MED ORDER — ZOLPIDEM TARTRATE 5 MG PO TABS: 10.0000 mg | ORAL_TABLET | Freq: Every evening | ORAL | Status: DC | PRN

## 2011-10-31 MED ORDER — LIDOCAINE HCL (PF) 1 % IJ SOLN
INTRAMUSCULAR | Status: AC
  Filled 2011-10-31: qty 30

## 2011-10-31 NOTE — Progress Notes (Signed)
Chaplain's Note:  Spoke with patient.  Patient asked me to contact his wife.  He gave me the cell phone number [phone].  I called and left a message for her to call the hospital ED.  I relayed this information to the cath lab.  Will continue to follow as needed or requested. [name]  [phone]  10/31/11 0724  Clinical Encounter Type  Visited With Patient  Visit Type Pre-op  Referral From Other (Comment) (code stemi)

## 2011-10-31 NOTE — H&P (Signed)
Cardiology H&P  Subjective:  Joshua [name] is a 55 y.o. male who presents for evaluation of chest pain.  He initially presented one day prior with chest pain and nausea/vomiting.  He reports that he has been having these symptoms for approximately 3-4 weeks off and on.  Intensity has progressed and occurs both at rest and with activity.  He states pain is 8-9/10.  He had reportedly normal ekg (not currently available), and negative enzymes with improvement of symptoms.  He however states that his pain returned at home around 11am and continued off and on.  He came back in to ED as pain intensity increased along with repeat episodes of nausea/vomiting.  He denied any shortness of breath.  He denied any prior hx of MI or known CAD.  He reports normal stress test a few years prior.  No history of cardiac procedures.    Patient Active Problem List  Diagnoses Date Noted  . STEMI (ST elevation myocardial infarction) 10/31/2011    Priority: High  . DIABETES MELLITUS, TYPE II 01/12/2011  . HYPERLIPIDEMIA 01/12/2011  . HYPERTENSION 01/12/2011  . GERD 01/12/2011  . ACUTE OSTEOMYELITIS, ANKLE AND FOOT 01/12/2011   Past Medical History  Diagnosis Date  . Diabetes mellitus   . Hypertension   . Hyperlipemia     Past Surgical History  Procedure Date  . Breast lumpectomy     No Known Allergies  Current Facility-Administered Medications  Medication Dose Route Frequency Provider Last Rate Last Dose  . 0.9 %  sodium chloride infusion   Intravenous Once Saddie Benders. Ghim, MD      . 0.9 %  sodium chloride infusion  30 mL Intravenous Continuous Saddie Benders. Ghim, MD      . aspirin 81 MG chewable tablet        81 mg at 10/31/11 0649  . aspirin chewable tablet 162 mg  162 mg Oral Once Saddie Benders. Ghim, MD   162 mg at 10/31/11 0631  . aspirin chewable tablet 162 mg  162 mg Oral Once Saddie Benders. Ghim, MD   162 mg at 10/31/11 0639  . heparin injection 4,000 Units  4,000 Units Intravenous STAT Saddie Benders. Ghim,  MD   4,000 Units at 10/31/11 [phone]  . HYDROmorphone (DILAUDID) injection 1 mg  1 mg Intravenous Once Saddie Benders. Ghim, MD   1 mg at 10/31/11 9604  . metoCLOPramide (REGLAN) injection 10 mg  10 mg Intravenous Once Saddie Benders. Ghim, MD   10 mg at 10/31/11 0634  . nitroGLYCERIN (NITROSTAT) SL tablet 0.3 mg  0.3 mg Sublingual Q5 Min x 3 PRN Saddie Benders. Ghim, MD      . sodium chloride 0.9 % bolus 500 mL  500 mL Intravenous Once Saddie Benders. Ghim, MD       Current Outpatient Prescriptions  Medication Sig Dispense Refill  . allopurinol (ZYLOPRIM) 300 MG tablet Take 300 mg by mouth daily.       . Amlodipine-Valsartan-HCTZ (EXFORGE HCT) 10-320-25 MG TABS Take 1 tablet by mouth daily.        [name] atenolol (TENORMIN) 50 MG tablet Take 50 mg by mouth daily.        [name] esomeprazole (NEXIUM) 40 MG capsule Take 1 capsule (40 mg total) by mouth daily.  30 capsule  0  . HYDROcodone-acetaminophen (VICODIN) 5-500 MG per tablet Take 1 tablet by mouth. Every four to six hours as needed. For dental pain      . insulin NPH-insulin regular (HUMULIN  70/30) (70-30) 100 UNIT/ML injection Inject 40 Units into the skin 2 (two) times daily.       . metoCLOPramide (REGLAN) 10 MG tablet Take 1 tablet (10 mg total) by mouth every 6 (six) hours. Take every 6 hours prior to meals  30 tablet  0  . atorvastatin (LIPITOR) 40 MG tablet Take 40 mg by mouth daily.         Facility-Administered Medications Ordered in Other Encounters  Medication Dose Route Frequency Provider Last Rate Last Dose  . gi cocktail  30 mL Oral Once Saddie Benders. Ghim, MD   30 mL at 10/30/11 0815    History  Substance Use Topics  . Smoking status: Never Smoker   . Smokeless tobacco: Not on file  . Alcohol Use: 10.8 oz/week    18 Cans of beer per week    History reviewed. No pertinent family history.   Review of Systems A comprehensive review of systems was negative other than stated in HPI.  Objective:  Patient Vitals for the past 8 hrs:  BP Temp Temp src  Pulse Resp SpO2 Height Weight  10/31/11 0615 172/92 mmHg - - 60  17  100 % - -  10/31/11 0600 177/98 mmHg - - 60  11  100 % - -  10/31/11 0500 166/93 mmHg 98 F (36.7 C) Oral - 22  100 % $Rem'5\' 9"'pOrD$  (1.753 m) 136.079 kg (300 lb)  10/31/11 0440 123/93 mmHg 98.2 F (36.8 C) Oral 67  18  100 % - -   General Appearance:    Alert, cooperative, mild distress, diaphoretic, appears stated age, obese male  Head:    Normocephalic, without obvious abnormality, atraumatic  Eyes:    PERRL, conjunctiva/corneas clear, EOM's intact, fundi    benign, both eyes       Neck:   Supple, no carotid bruit or JVD  Lungs:     Clear to auscultation bilaterally, respirations unlabored  Heart:    Regular rate and rhythm, S1 and S2 normal, no murmur, rub   or gallop  Abdomen:     Soft, non-tender, bowel sounds active all four quadrants,    no masses, no organomegaly  Extremities:   Extremities normal, atraumatic, no cyanosis or edema  Pulses:   2+ and symmetric all extremities  Skin:   no rashes or lesions  Neurologic:   CNII-XII intact. Normal strength, sensation and reflexes      throughout   Results for orders placed during the hospital encounter of 10/31/11 (from the past 48 hour(s))  CBC     Status: Abnormal   Collection Time   10/31/11  5:41 AM      Component Value Range Comment   WBC 10.8 (*) 4.0 - 10.5 (K/uL)    RBC 5.09  4.22 - 5.81 (MIL/uL)    Hemoglobin 14.9  13.0 - 17.0 (g/dL)    HCT 42.9  39.0 - 52.0 (%)    MCV 84.3  78.0 - 100.0 (fL)    MCH 29.3  26.0 - 34.0 (pg)    MCHC 34.7  30.0 - 36.0 (g/dL)    RDW 13.3  11.5 - 15.5 (%)    Platelets 206  150 - 400 (K/uL)   DIFFERENTIAL     Status: Abnormal   Collection Time   10/31/11  5:41 AM      Component Value Range Comment   Neutrophils Relative 86 (*) 43 - 77 (%)    Neutro Abs 9.3 (*) 1.7 -  7.7 (K/uL)    Lymphocytes Relative 9 (*) 12 - 46 (%)    Lymphs Abs 1.0  0.7 - 4.0 (K/uL)    Monocytes Relative 5  3 - 12 (%)    Monocytes Absolute 0.5  0.1 - 1.0  (K/uL)    Eosinophils Relative 0  0 - 5 (%)    Eosinophils Absolute 0.0  0.0 - 0.7 (K/uL)    Basophils Relative 0  0 - 1 (%)    Basophils Absolute 0.0  0.0 - 0.1 (K/uL)   COMPREHENSIVE METABOLIC PANEL     Status: Abnormal   Collection Time   10/31/11  5:41 AM      Component Value Range Comment   Sodium 133 (*) 135 - 145 (mEq/L)    Potassium 4.1  3.5 - 5.1 (mEq/L) DELTA CHECK NOTED   Chloride 95 (*) 96 - 112 (mEq/L)    CO2 23  19 - 32 (mEq/L)    Glucose, Bld 344 (*) 70 - 99 (mg/dL)    BUN 14  6 - 23 (mg/dL)    Creatinine, Ser 0.92  0.50 - 1.35 (mg/dL)    Calcium 9.3  8.4 - 10.5 (mg/dL)    Total Protein 7.3  6.0 - 8.3 (g/dL)    Albumin 3.3 (*) 3.5 - 5.2 (g/dL)    AST 102 (*) 0 - 37 (U/L)    ALT 24  0 - 53 (U/L)    Alkaline Phosphatase 120 (*) 39 - 117 (U/L)    Total Bilirubin 0.6  0.3 - 1.2 (mg/dL)    GFR calc non Af Amer >90  >90 (mL/min)    GFR calc Af Amer >90  >90 (mL/min)   LIPASE, BLOOD     Status: Normal   Collection Time   10/31/11  5:41 AM      Component Value Range Comment   Lipase 15  11 - 59 (U/L)   TROPONIN I     Status: Abnormal   Collection Time   10/31/11  5:41 AM      Component Value Range Comment   Troponin I 12.59 (*) <0.30 (ng/mL)   POCT I-STAT TROPONIN I     Status: Abnormal   Collection Time   10/31/11  6:59 AM      Component Value Range Comment   Troponin i, poc 8.09 (*) 0.00 - 0.08 (ng/mL)    Comment NOTIFIED PHYSICIAN      Comment 3             Dg Chest Portable 1 View  10/31/2011  *RADIOLOGY REPORT*  Clinical Data: Epigastric pain, myocardial infarction.  PORTABLE CHEST - 1 VIEW  Comparison: 10/30/2011  Findings: Cardiomegaly with central vascular congestion.  Mild interstitial prominence.  Mild retrocardiac linear opacity may reflect scarring or atelectasis.  No pleural effusion or pneumothorax.  No acute osseous abnormality.  IMPRESSION: Cardiomegaly with mild interstitial edema pattern.  Original Report Authenticated By: Suanne Marker, M.D.    Dg Chest Portable 1 View  10/30/2011  *RADIOLOGY REPORT*  Clinical Data: Chest pain  PORTABLE CHEST - 1 VIEW  Comparison: 12/15/2010  Findings: Cardiomegaly.  Central vascular congestion.  Apparent interstitial prominence may be exaggerated by patient body habitus, portable technique, and hypoaeration.  No pleural effusion or pneumothorax.  Within limitations of technique, no acute osseous abnormality.  IMPRESSION: Cardiomegaly with central vascular congestion.  Apparent interstitial prominence may be exaggerated by technique/patient body habitus.  Consider PA and lateral radiographs to better characterize if there  is clinical concern for edema.  Original Report Authenticated By: Suanne Marker, M.D.    ECG:  Sinus rhythm with hyperacute ST elevation in leads V2-V5, 1, avl, slight progression on repeat EKGs  Assessment:  Anterolateral STEMI Type 2 DM HTN HLD GERD  Plan:  1. CODE STEMI activated, Dr. Claiborne Billings present at bedside 2. Emergent Left heart catheterization possible PCI 3. ASA, NITRO, Heparin bolus given in ED 4. No absolute contraindications to DES 5. Further care in cardiac ICU post procedure

## 2011-10-31 NOTE — ED Notes (Signed)
MD at bedside. 

## 2011-10-31 NOTE — Op Note (Signed)
Emergency cardiac Catherization/PCI  Joshua [name], 55 y.o., male  DICTATION # [phone], [id number]  Wade A, 10/31/2011, 8:34 AM

## 2011-10-31 NOTE — Progress Notes (Signed)
ANTICOAGULATION CONSULT NOTE - Initial Consult  Pharmacy Consult for heparin Indication: ACS  No Known Allergies  Patient Measurements: Height: 5\' 9"  (175.3 cm) Weight: 300 lb (136.079 kg) IBW/kg (Calculated) : 70.7  Adjusted Body Weight:   Vital Signs: Temp: 98 F (36.7 C) (11/18 0500) Temp src: Oral (11/18 0500) BP: 172/92 mmHg (11/18 0615) Pulse Rate: 75  (11/18 0717)  Labs:  Basename 10/31/11 0646 10/31/11 0541 10/30/11 0532 10/30/11 0334  HGB -- 14.9 -- 13.4  HCT -- 42.9 -- 39.5  PLT -- 206 -- 180  APTT 32 -- -- --  LABPROT 14.0 -- -- --  INR 1.06 -- -- --  HEPARINUNFRC -- -- -- --  CREATININE -- 0.92 -- 1.14  CKTOTAL -- -- -- --  CKMB -- -- -- --  TROPONINI -- 12.59* <0.30 <0.30   Estimated Creatinine Clearance: 127.3 ml/min (by C-G formula based on Cr of 0.92).  Medical History: Past Medical History  Diagnosis Date  . Diabetes mellitus   . Hypertension   . Hyperlipemia     Medications:  Prescriptions prior to admission  Medication Sig Dispense Refill  . allopurinol (ZYLOPRIM) 300 MG tablet Take 300 mg by mouth daily.       . Amlodipine-Valsartan-HCTZ (EXFORGE HCT) 10-320-25 MG TABS Take 1 tablet by mouth daily.        [name] atenolol (TENORMIN) 50 MG tablet Take 50 mg by mouth daily.        [name] esomeprazole (NEXIUM) 40 MG capsule Take 1 capsule (40 mg total) by mouth daily.  30 capsule  0  . HYDROcodone-acetaminophen (VICODIN) 5-500 MG per tablet Take 1 tablet by mouth. Every four to six hours as needed. For dental pain      . insulin NPH-insulin regular (HUMULIN 70/30) (70-30) 100 UNIT/ML injection Inject 40 Units into the skin 2 (two) times daily.       . metoCLOPramide (REGLAN) 10 MG tablet Take 1 tablet (10 mg total) by mouth every 6 (six) hours. Take every 6 hours prior to meals  30 tablet  0  . DISCONTD: atorvastatin (LIPITOR) 40 MG tablet Take 40 mg by mouth daily.          Assessment: Patient is a 55 y.o M s/p Code STEMI, cath procedure, and PCI  (stent placed in LAD) with plan for another PCI procedure next week for RCA.  Sheath is still in placed with plan to remove it around noon today (per RN).  Will start heparin gtt 4 hours after sheath removal.  Goal of Therapy:  Heparin level 0.3-0.7 units/ml   Plan:  1) Start heparin drip at 1400 units/hr FOUR after sheath removal (no bolus) 2) check 6 hour heparin level  Leonila Speranza P 10/31/2011,9:20 AM

## 2011-10-31 NOTE — ED Provider Notes (Signed)
History     CSN: [id number] Arrival date & time: 10/31/2011  4:37 AM   First MD Initiated Contact with Patient 10/31/11 [phone]      Chief Complaint  Patient presents with  . Abdominal Pain    (Consider location/radiation/quality/duration/timing/severity/associated sxs/prior treatment) HPI Comments: Patient presents to the emergency department due to worsening pain in his upper epigastrium associated with several episodes of vomiting. Patient was seen by me yesterday for left-sided lower chest heaviness. He was evaluated for possible cardiac chest pain with workup being unremarkable. I had spoken to his regular doctor to help arrange followup. He reports that he went home and has been taking some leftover pain medication as well as taking what I prescribed him which was oral Reglan and Nexium without relief. Patient reports that he has been passing flatus and had gone to the bathroom 3 or 4 times with production of very little stool. He denies any prior abdominal surgeries. Pain does not radiate to his back. It seems to stay mostly in his upper epigastrium. Pain is described as cramping and is now persistent. He denies prior history of similar symptoms. He does not have any chest pain currently. No sweats or shortness of breath.  The history is provided by the patient.    Past Medical History  Diagnosis Date  . Diabetes mellitus   . Hypertension   . Hyperlipemia     Past Surgical History  Procedure Date  . Breast lumpectomy     History reviewed. No pertinent family history.  History  Substance Use Topics  . Smoking status: Never Smoker   . Smokeless tobacco: Not on file  . Alcohol Use: 10.8 oz/week    18 Cans of beer per week      Review of Systems  Gastrointestinal: Positive for nausea, vomiting and abdominal pain.  Musculoskeletal: Negative for back pain.  All other systems reviewed and are negative.    Allergies  Review of patient's allergies indicates no known  allergies.  Home Medications   Current Outpatient Rx  Name Route Sig Dispense Refill  . ALLOPURINOL 300 MG PO TABS Oral Take 300 mg by mouth daily.     [name] AMLODIPINE-VALSARTAN-HCTZ 10-320-25 MG PO TABS Oral Take 1 tablet by mouth daily.      . ATENOLOL 50 MG PO TABS Oral Take 50 mg by mouth daily.      [name] ESOMEPRAZOLE MAGNESIUM 40 MG PO CPDR Oral Take 1 capsule (40 mg total) by mouth daily. 30 capsule 0  . HYDROCODONE-ACETAMINOPHEN 5-500 MG PO TABS Oral Take 1 tablet by mouth. Every four to six hours as needed. For dental pain    . INSULIN ISOPHANE & REGULAR (70-30) 100 UNIT/ML [place] SUSP Subcutaneous Inject 40 Units into the skin 2 (two) times daily.     [name] METOCLOPRAMIDE HCL 10 MG PO TABS Oral Take 1 tablet (10 mg total) by mouth every 6 (six) hours. Take every 6 hours prior to meals 30 tablet 0  . ATORVASTATIN CALCIUM 40 MG PO TABS Oral Take 40 mg by mouth daily.        BP 166/93  Pulse 67  Temp(Src) 98 F (36.7 C) (Oral)  Resp 22  Ht 5\' 9"  (1.753 m)  Wt 300 lb (136.079 kg)  BMI 44.30 kg/m2  SpO2 100%  Physical Exam  Constitutional: He appears well-developed and well-nourished.  HENT:  Head: Normocephalic and atraumatic.  Eyes: Pupils are equal, round, and reactive to light. No scleral icterus.  Neck: Normal  range of motion.  Cardiovascular: Normal rate and regular rhythm.   No murmur heard. Pulmonary/Chest: Effort normal and breath sounds normal. No respiratory distress. He has no wheezes.  Abdominal: Soft. There is tenderness in the epigastric area. There is no rigidity, no rebound, no guarding, no CVA tenderness, no tenderness at McBurney's point and negative Murphy's sign.  Musculoskeletal: Normal range of motion.  Neurological: He is alert.  Skin: Skin is warm and dry. No rash noted.  Psychiatric: He has a normal mood and affect.    ED Course  Procedures (including critical care time)   Labs Reviewed  CBC  DIFFERENTIAL  COMPREHENSIVE METABOLIC PANEL  LIPASE, BLOOD   URINALYSIS, ROUTINE W REFLEX MICROSCOPIC  TROPONIN I   Dg Chest Portable 1 View  10/30/2011  *RADIOLOGY REPORT*  Clinical Data: Chest pain  PORTABLE CHEST - 1 VIEW  Comparison: 12/15/2010  Findings: Cardiomegaly.  Central vascular congestion.  Apparent interstitial prominence may be exaggerated by patient body habitus, portable technique, and hypoaeration.  No pleural effusion or pneumothorax.  Within limitations of technique, no acute osseous abnormality.  IMPRESSION: Cardiomegaly with central vascular congestion.  Apparent interstitial prominence may be exaggerated by technique/patient body habitus.  Consider PA and lateral radiographs to better characterize if there is clinical concern for edema.  Original Report Authenticated By: Waneta Martins, M.D.     No diagnosis found.    MDM  I reviewed my notes from yesterday's visit.  Plan today is to obtain a repeat CBC, comprehensive metabolic panel, lipase and urinalysis. Will get another troponin as well although I do not suspect cardiac disease. Given today's upper epigastric symptoms, I suspect even yesterday symptoms are likely related to a gastrointestinal etiology. Although he is a negative Murphy sign, biliary colic is certainly on the differential given his age being greater than 50. However since it is not a clear-cut Murphy sign, will get abdominal CT scan for a more comprehensive evaluation. He has had no prior abdominal surgeries according to him therefore bowel obstruction although possible is less likely. In the meantime I will give IV fluids, IV antiemetics and continue to monitor.    ECG time 04:59 shows SR at rate 63, normal axis, non specific ST elevation in lead V3 and V5 with poor r wave progression, however pt was moving around due to pain.  Plan to repeat ECG.   Unable to locate ECG dated 11/17.  Last ECG that is seen is from 01/18/11.    Repeat ECG at 0549 continues to show slight elevation in V2, V3 and q waves.   6:23  AM  Spoke to cardiology fellow with [facility] who will see ECG due to pt's ongoing symptoms and pt's sig risk factors for cardiac although his symptoms are more epigastric and reproducible on exam.      6:32 AM With additional questioning, pt reports that when he left ED yesterday AM, was ok until around 10 AM when symptoms began again and gradually got worse, persisted despite taking his meds.  He reports then vomiting began, finally culminating in him returning this AM.  Troponin is 12, obviously drastic change to today.  Will call code STEMI given still has some ST elevation in anterior leads and ongoing pain.      CRITICAL CARE Performed by: Lear Ng.  ?  Total critical care time: 30 Critical care time was exclusive of separately billable procedures and treating other patients.  Critical care was necessary to treat or prevent imminent or  life-threatening deterioration.  Critical care was time spent personally by me on the following activities: development of treatment plan with patient and/or surrogate as well as nursing, discussions with consultants, evaluation of patient's response to treatment, examination of patient, obtaining history from patient or surrogate, ordering and performing treatments and interventions, ordering and review of laboratory studies, ordering and review of radiographic studies, pulse oximetry and re-evaluation of patient's condition.  06:42 AM Spoke again to cardiology, I did call code STEMI for now since technically has ST elevation in anterior leads and has continued symptoms.  Cardiology is evaluating patient now.       Saddie Benders. Dorna Mai, MD 10/31/11 [phone]

## 2011-10-31 NOTE — ED Notes (Signed)
Patient placed on Zoll with pacer pads

## 2011-10-31 NOTE — Cardiovascular Report (Signed)
NAMEMUNIR, VICTORIAN NO.:  [id number]  MEDICAL RECORD NO.:  [id number]  LOCATION:  2910                         FACILITY:  [facility]  PHYSICIAN:  [name], M.D.     DATE OF BIRTH:  1956/02/28  DATE OF PROCEDURE:  10/31/2011 DATE OF DISCHARGE:                           CARDIAC CATHETERIZATION   PROCEDURE:  Emergent cardiac catheterization:  Cine angiography in the coronary arteries; left ventriculography; percutaneous transluminal coronary angioplasty/stenting of the left anterior descending artery; distal aortography.  INDICATIONS:  [name] is a 55 year old African American gentleman who admits to at least 30 year history of hypertension.  He has a 23 year history of diabetes mellitus.  He denies any known other cardiac history.  Apparently, for the last several weeks, he has been noticing episodes of chest discomfort, which he initially may have felt was due to GI discomfort.  Apparently one day previously, he had presented to the emergency room at approximately 2 a.m. with this discomfort.  Apparently, he stayed in the emergency room where 2 sets of cardiac enzymes were negative.  He was sent home by the emergency room physicians and was given a prescription for Reglan as well as Nexium. The patient continued to have lingering chest pain off and on throughout the entire day.  Last evening, he developed several episodes of nausea and vomiting.  Early this morning, he again re-presented to [facility] Emergency Room.  His ECG had now changed, and he had developed new ST- segment elevation in leads V5 and proximal 1 mm in V3 and V4.  A code STEMI was called.  He is now taken emergently to the cardiac catheterization laboratory.  PROCEDURE:  Upon arrival to the catheterizations laboratory, the patient still had residual discomfort but was improved.  He had received 4000 units of heparin in the emergency room.  He also received aspirin. Right  femoral artery was punctured anteriorly and a 6-French sheath was inserted without difficulty.  Diagnostic catheterization was done initially with the 6-French right catheter.  A 6-French left catheter was then inserted through the demonstration of RCA disease but subtotal LAD lesion and it was the feeling that the LAD lesion was the culprit vessel.  Decision was made to perform intervention on the LAD system.  A 6-French XB LAD 4 guide was used.  The patient received Angiomax bolus plus infusion.  Brevital 180 mg was administered orally.  The patient also was started on IV nitroglycerin drip.  He also received IC nitroglycerin selectively down the coronary artery.  A Prowater wire was advanced down the LAD.  ACT was documented to be therapeutic. Predilatation to the 99% stenosis was done with a 2.5 x 12 mm Emerge balloon.  Once this site was opened, the distal LAD was larger caliber. Initially, there was TIMI 2 to TIMI-3 flow.  The vessel was not completely occluded.  There also was 60-70% stenosis proximal to the previous subtotal occlusion just beyond the first diagonal vessel.  Thus far, both lesions needed to be stented.  A 3.0 x 32 mm PROMUS DES stent was then inserted and deployed x2 up to 14 atmospheres.  A 3.25 x 20 mm noncompliant  Trek was used for post-stent dilatation.  Both lesions of 70 and 99% were reduced to 0%.  There was now brisk TIMI-3 flow.  There was no evidence for dissection.  The balloon, wire and left catheter were then removed.  A 6-French pigtail catheter was used for biplane cine left ventriculography as well as distal aortography.  At the completion of the procedure, the patient was pain-free.  The arterial sheath was sutured in place with plans for sheath removal later today.  HEMODYNAMIC DATA:  Central aortic pressure was 115/68.  Left ventricular pressure 115/8.  ANGIOGRAPHIC DATA:  Left main coronary artery was angiographically normal and bifurcated  into an LAD and left circumflex system.  The LAD gave rise to a proximal septal perforating artery and proximal diagonal vessel.  Just beyond this diagonal vessel was 60-70% narrowing. Just beyond this region was an area of 99% stenosis.  Initially, there was TIMI 2 to TIMI-3 flow supplying the distal LAD.  The circumflex vessel was moderate-sized vessel that had 30% mid AV groove stenosis between the OM1 and OM2 vessel.  The right coronary artery was a large caliber vessel that had 90% stenosis in its mid portion proximal to the anterior RV marginal branch.  Biplane cine ventriculography revealed an ejection fraction of 50-55%. There was hypokinesis involving the mid distal anterolateral wall with focal severe hypo to akinesis involving the apical inferior segment.  On the LAO projection, there was moderate hypocontractility involving the inferoapical region.  Distal aortography revealed widely patent renal arteries.  There was no significant aortoiliac disease.  Following successful coronary intervention to the LAD system, which was felt to be the culprit vessel, both lesions of 67% followed by 99% stenosis, which were successfully stented with a 3.0 x 32 mm PROMUS DES stent postdilated to 3.25 mm.  The lesions were reduced to 0%.  There was brisk TIMI-3 flow.  There was no evidence for dissection.  IMPRESSION: 1. Acute ST-segment elevation anterolateral wall myocardial infarction     secondary to subtotal stenosis involving the mid left anterior     descending artery. 2. A 30% mid atrioventricular groove circumflex stenosis. 3. A 90% focal mid right coronary artery stenosis. 4. Successful percutaneous coronary intervention to the left anterior     descending with percutaneous transluminal coronary     angioplasty/stenting of tandem 60-70% followed by 99% left anterior     descending stenoses being reduced to 0% with ultimate insertion of     a 3.0 x32 mm PROMUS drug-eluting  stent postdilated to 3.25 mm. 5. Bivalirudin/180 mg Brilinta/intracoronary as well as intravenous     nitroglycerin in this patient who had previously received aspirin     and heparin in the emergency room prior to arrival to the cardiac     catheterization laboratory.          ______________________________ [name], M.D.     TK/MEDQ  D:  10/31/2011  T:  10/31/2011  Job:  [id number]  cc:   Jacelyn Pi, M.D.

## 2011-10-31 NOTE — Progress Notes (Signed)
ANTICOAGULATION CONSULT NOTE - Follow Up Consult  Pharmacy Consult for Heparin Indication: ACS - awaiting possible PCI on Wed  No Known Allergies  Patient Measurements: Height: 5\' 9"  (175.3 cm) Weight: 297 lb 9.9 oz (135 kg) IBW/kg (Calculated) : 70.7  Heparin dosing weight: 103 kg  Vital Signs: Temp: 98.3 F (36.8 C) (11/18 2000) Temp src: Oral (11/18 2000) BP: 112/64 mmHg (11/18 2300) Pulse Rate: 68  (11/18 2300)  Labs:  Basename 10/31/11 2211 10/31/11 0646 10/31/11 0541 10/30/11 0532 10/30/11 0334  HGB -- -- 14.9 -- 13.4  HCT -- -- 42.9 -- 39.5  PLT -- -- 206 -- 180  APTT -- 32 -- -- --  LABPROT -- 14.0 -- -- --  INR -- 1.06 -- -- --  HEPARINUNFRC 0.17* -- -- -- --  CREATININE -- -- 0.92 -- 1.14  CKTOTAL -- -- -- -- --  CKMB -- -- -- -- --  TROPONINI -- -- 12.59* <0.30 <0.30   Estimated Creatinine Clearance: 126.6 ml/min (by C-G formula based on Cr of 0.92).   Assessment: Heparin level subtherapeutic at first check. No issues with line or bleeding.  Goal of Therapy:  Heparin level 0.3-0.7 units/ml   Plan:  1. Increase heparin to 1700 units/hr 2. F/u 6 hr heparin level  Joshua [name], Juanda Chance 10/31/2011,11:37 PM

## 2011-11-01 LAB — CBC
HCT: 39.8 % (ref 39.0–52.0)
Hemoglobin: 13.4 g/dL (ref 13.0–17.0)
MCH: 28.9 pg (ref 26.0–34.0)
MCHC: 33.7 g/dL (ref 30.0–36.0)
MCV: 86 fL (ref 78.0–100.0)
Platelets: 179 10*3/uL (ref 150–400)
RBC: 4.63 MIL/uL (ref 4.22–5.81)
RDW: 13.8 % (ref 11.5–15.5)
WBC: 9.2 10*3/uL (ref 4.0–10.5)

## 2011-11-01 LAB — BASIC METABOLIC PANEL
BUN: 18 mg/dL (ref 6–23)
CO2: 25 mEq/L (ref 19–32)
Calcium: 8.4 mg/dL (ref 8.4–10.5)
Chloride: 99 mEq/L (ref 96–112)
Creatinine, Ser: 1.17 mg/dL (ref 0.50–1.35)
GFR calc Af Amer: 81 mL/min — ABNORMAL LOW (ref >90)
GFR calc non Af Amer: 70 mL/min — ABNORMAL LOW (ref >90)
Glucose, Bld: 276 mg/dL — ABNORMAL HIGH (ref 70–99)
Potassium: 3.8 mEq/L (ref 3.5–5.1)
Sodium: 135 mEq/L (ref 135–145)

## 2011-11-01 LAB — HEMOGLOBIN A1C
Hgb A1c MFr Bld: 11.3 % — ABNORMAL HIGH (ref <5.7)
Mean Plasma Glucose: 278 mg/dL — ABNORMAL HIGH (ref <117)

## 2011-11-01 LAB — GLUCOSE, CAPILLARY
Glucose-Capillary: 271 mg/dL — ABNORMAL HIGH (ref 70–99)
Glucose-Capillary: 241 mg/dL — ABNORMAL HIGH (ref 70–99)
Glucose-Capillary: 330 mg/dL — ABNORMAL HIGH (ref 70–99)
Glucose-Capillary: 172 mg/dL — ABNORMAL HIGH (ref 70–99)
Glucose-Capillary: 147 mg/dL — ABNORMAL HIGH (ref 70–99)

## 2011-11-01 LAB — POCT ACTIVATED CLOTTING TIME: Activated Clotting Time: 672 seconds

## 2011-11-01 LAB — URINE MICROSCOPIC-ADD ON

## 2011-11-01 LAB — CARDIAC PANEL(CRET KIN+CKTOT+MB+TROPI)
CK, MB: 25.9 ng/mL (ref 0.3–4.0)
CK, MB: 13.1 ng/mL (ref 0.3–4.0)
CK, MB: 9 ng/mL (ref 0.3–4.0)
Relative Index: 2.4 (ref 0.0–2.5)
Relative Index: 1.9 (ref 0.0–2.5)
Relative Index: 1.6 (ref 0.0–2.5)
Total CK: 1072 U/L — ABNORMAL HIGH (ref 7–232)
Total CK: 686 U/L — ABNORMAL HIGH (ref 7–232)
Total CK: 577 U/L — ABNORMAL HIGH (ref 7–232)
Troponin I: >25 ng/mL (ref <0.30)
Troponin I: 17.3 ng/mL (ref <0.30)
Troponin I: 14.73 ng/mL (ref <0.30)

## 2011-11-01 LAB — URINALYSIS, ROUTINE W REFLEX MICROSCOPIC
Bilirubin Urine: NEGATIVE
Glucose, UA: >1000 mg/dL — AB
Ketones, ur: NEGATIVE mg/dL
Leukocytes, UA: NEGATIVE
Nitrite: NEGATIVE
Protein, ur: 30 mg/dL — AB
Specific Gravity, Urine: 1.017 (ref 1.005–1.030)
Urobilinogen, UA: 0.2 mg/dL (ref 0.0–1.0)
pH: 5 (ref 5.0–8.0)

## 2011-11-01 LAB — HEPARIN LEVEL (UNFRACTIONATED): Heparin Unfractionated: 0.45 IU/mL (ref 0.30–0.70)

## 2011-11-01 MED ORDER — INSULIN ASPART PROT & ASPART (70-30 MIX) 100 UNIT/ML ~~LOC~~ SUSP
40.0000 [IU] | Freq: Two times a day (BID) | SUBCUTANEOUS | Status: DC
  Administered 2011-11-01 – 2011-11-03 (×5): 40 [IU] via SUBCUTANEOUS

## 2011-11-01 MED ORDER — METOPROLOL TARTRATE 50 MG PO TABS
50.0000 mg | ORAL_TABLET | Freq: Two times a day (BID) | ORAL | Status: DC
  Administered 2011-11-01 – 2011-11-04 (×7): 50 mg via ORAL
  Filled 2011-11-01 (×8): qty 1

## 2011-11-01 MED ORDER — ROSUVASTATIN CALCIUM 40 MG PO TABS
40.0000 mg | ORAL_TABLET | Freq: Every day | ORAL | Status: DC
  Administered 2011-11-01 – 2011-11-04 (×4): 40 mg via ORAL
  Filled 2011-11-01 (×4): qty 1

## 2011-11-01 MED ORDER — SODIUM CHLORIDE 0.9 % IV SOLN
INTRAVENOUS | Status: DC
  Administered 2011-11-02: 06:00:00 via INTRAVENOUS

## 2011-11-01 MED ORDER — BIOTENE DRY MOUTH MT LIQD
15.0000 mL | Freq: Two times a day (BID) | OROMUCOSAL | Status: DC
  Administered 2011-11-01: 15 mL via OROMUCOSAL

## 2011-11-01 MED FILL — Dextrose Inj 5%: INTRAVENOUS | Qty: 50 | Status: AC

## 2011-11-01 NOTE — Progress Notes (Signed)
Referred to patient by weekend on-call chaplain. Patient has been reflecting on the reality that he could have died had he not returned to the hospital for care. He acknowledges that some changes in his life need to be made to prevent future heart problems. He stated that he is very optimistic about his upcoming catherization procedure on Wednesday. Chaplain will follow up as needed.

## 2011-11-01 NOTE — Progress Notes (Signed)
ANTICOAGULATION CONSULT NOTE - Follow Up Consult  Pharmacy Consult for Heparin Indication: ACS - awaiting possible PCI on Wed  No Known Allergies  Patient Measurements: Height: 5\' 9"  (175.3 cm) Weight: 297 lb 2.9 oz (134.8 kg) IBW/kg (Calculated) : 70.7  Heparin dosing weight: 103 kg  Vital Signs: Temp: 99.1 F (37.3 C) (11/19 0800) Temp src: Oral (11/19 0400) BP: 119/61 mmHg (11/19 0900) Pulse Rate: 77  (11/19 0900)  Labs:  Basename 11/01/11 0535 11/01/11 0036 10/31/11 2211 10/31/11 0646 10/31/11 0541 10/30/11 0532 10/30/11 0334  HGB 13.4 -- -- -- 14.9 -- --  HCT 39.8 -- -- -- 42.9 -- 39.5  PLT 179 -- -- -- 206 -- 180  APTT -- -- -- 32 -- -- --  LABPROT -- -- -- 14.0 -- -- --  INR -- -- -- 1.06 -- -- --  HEPARINUNFRC 0.45 -- 0.17* -- -- -- --  CREATININE 1.17 -- -- -- 0.92 -- 1.14  CKTOTAL -- 1072* -- -- -- -- --  CKMB -- 25.9* -- -- -- -- --  TROPONINI -- >25.00* -- -- 12.59* <0.30 --   Estimated Creatinine Clearance: 99.5 ml/min (by C-G formula based on Cr of 1.17).   Assessment: Heparin level = 0.45 and within desired goal range.  Plans for staged PCI on the 21st as indicated.  No noted bleeding complications.   Goal of Therapy:  Heparin level 0.3-0.7 units/ml   Plan:  1. Continue IV heparin at 1700 units/hr 2. F/u AM heparin level and adjust accordingly. 3.  Monitor for s/s of bleeding.  Joshua [name] [place] 11/01/2011,9:44 AM

## 2011-11-01 NOTE — Progress Notes (Signed)
Subjective: No chest pain. No SOB.  He stated glucose has been elevated for several  Days/weeks with tooth infection which was extracted and treated with antibiotics.   Objective: Vital signs in last 24 hours: Temp:  [98 F (36.7 C)-99.6 F (37.6 C)] 99.6 F (37.6 C) (11/19 0400) Pulse Rate:  [60-76] 62  (11/19 0500) Resp:  [3-26] 21  (11/19 0500) BP: (91-177)/(41-98) 118/44 mmHg (11/19 0500) SpO2:  [99 %-100 %] 100 % (11/19 0500) Arterial Line BP: (156-188)/(78-116) 187/112 mmHg (11/18 1600) Weight:  [135 kg (297 lb 9.9 oz)] 297 lb 9.9 oz (135 kg) (11/18 0900) Weight change: -1.079 kg (-2 lb 6.1 oz)   Intake/Output from previous day: -495  11/18 0701 - 11/19 0700 In: 1978 [P.O.:480; I.V.:1498] Out: 2500 [Urine:2500] Intake/Output this shift: Total I/O In: 1138 [P.O.:240; I.V.:898] Out: 1425 [Urine:1425]  PE: GENERAL:A& O X 3, MAE. Pleasant affect HEART:S1S2, RRR. No obvious murmur. LUNGS:Diminished but clear. ABD:+ BS, soft non tender.  EXT:Rt. Groin cath site with dressing in place. NEURO:A&O, MAE. Follows commands.    Lab Results:  [facility] 10/31/11 0541 10/30/11 0334  WBC 10.8* 6.9  HGB 14.9 13.4  HCT 42.9 39.5  PLT 206 180   BMET  Basename 10/31/11 0541 10/30/11 0334  NA 133* 138  K 4.1 3.4*  CL 95* 101  CO2 23 22  GLUCOSE 344* 157*  BUN 14 18  CREATININE 0.92 1.14  CALCIUM 9.3 9.3    Basename 11/01/11 0036 10/31/11 0541  TROPONINI >25.00* 12.59*    No results found for this basename: CHOL, HDL, LDLCALC, LDLDIRECT, TRIG, CHOLHDL   No results found for this basename: HGBA1C     No results found for this basename: TSH    Hepatic Function Panel  Basename 10/31/11 0541  PROT 7.3  ALBUMIN 3.3*  AST 102*  ALT 24  ALKPHOS 120*  BILITOT 0.6  BILIDIR --  IBILI --   No results found for this basename: CHOL in the last 72 hours No results found for this basename: PROTIME in the last 72 hours    EKG: Orders placed during the hospital  encounter of 10/31/11  . ED EKG  . ED EKG  . EKG 12-LEAD  . EKG 12-LEAD  . EKG 12-LEAD  . EKG 12-LEAD    Studies/Results: Dg Chest Portable 1 View  10/31/2011  *RADIOLOGY REPORT*  Clinical Data: Epigastric pain, myocardial infarction.  PORTABLE CHEST - 1 VIEW  Comparison: 10/30/2011  Findings: Cardiomegaly with central vascular congestion.  Mild interstitial prominence.  Mild retrocardiac linear opacity may reflect scarring or atelectasis.  No pleural effusion or pneumothorax.  No acute osseous abnormality.  IMPRESSION: Cardiomegaly with mild interstitial edema pattern.  Original Report Authenticated By: Suanne Marker, M.D.    Medications: I have reviewed the patient's current medications.  Assessment/Plan: Patient Active Problem List  Diagnoses  . DIABETES MELLITUS, TYPE II  . HYPERLIPIDEMIA  . HYPERTENSION  . GERD  . ACUTE OSTEOMYELITIS, ANKLE AND FOOT  . STEMI (ST elevation myocardial infarction)  . Presence of stent in anterior descending branch of left coronary artery   PLAN:  Day 1 post STEMI, with stent placed to the LAD.  Plan for staged intervention to RCA.  DM insulin dependent, uncontrolled.  Increase 70/30 units to 40 units BID his home dose.  Currently on moderate coverage may need resistant coverage.  Will obtain Diabetic consult.  LABS PENDING. Is on beta blocker, ace.   HgbA1C pending.  LOS: 1 day  INGOLD,LAURA R 11/01/2011, 5:49 AM  Patient seen and examined. Agree with assessment and plan. Day 1 s/p anterolateral MI and PCI to LAD. No recurrent cp.  Will increase Lopressor to 50 mg bid; increase lipitor to 80 mg. Plan staged PCI to 90% RCA lesion on 11/03/11. Doloris Servantes A 11/01/2011 9:22 AM

## 2011-11-01 NOTE — Progress Notes (Addendum)
CARDIAC REHAB PHASE I   PRE:  Rate/Rhythm: 78 SR  BP:  Supine:   Sitting: 119/61  Standing:    SaO2: 99 RA  MODE:  Ambulation: 350 ft   POST:  Rate/Rhythem: 92 SR  BP:  Supine:   Sitting: 124/71  Standing:    SaO2:  0845- 1000 Pt tolerated ambulation well without c/o of cp or SOB.  Started MI education with him.He agrees to [employer]. CRP in [place] will send referral. Pt to chair after walk with call light in reach.  Joshua [name]

## 2011-11-01 NOTE — Progress Notes (Signed)
CRITICAL VALUE ALERT  Critical value received:  CKMB 25.9; Troponin 25   Date of notification:  11/01/2011  Time of notification:  0215  Critical value read back:yes  Nurse who received alert:  Martinique Perkins  MD notified (1st page):  Cecilie Kicks  Time of first page:  0217  MD notified (2nd page):  Time of second page:  Responding MD:  Cecilie Kicks  Time MD responded:  [id number]  Perkins, [place] Elizabeth

## 2011-11-02 ENCOUNTER — Encounter (HOSPITAL_COMMUNITY): Payer: Self-pay

## 2011-11-02 LAB — GLUCOSE, CAPILLARY
Glucose-Capillary: 128 mg/dL — ABNORMAL HIGH (ref 70–99)
Glucose-Capillary: 162 mg/dL — ABNORMAL HIGH (ref 70–99)
Glucose-Capillary: 193 mg/dL — ABNORMAL HIGH (ref 70–99)
Glucose-Capillary: 111 mg/dL — ABNORMAL HIGH (ref 70–99)

## 2011-11-02 LAB — CBC
HCT: 40.1 % (ref 39.0–52.0)
HCT: 38 % — ABNORMAL LOW (ref 39.0–52.0)
Hemoglobin: 13.6 g/dL (ref 13.0–17.0)
Hemoglobin: 13 g/dL (ref 13.0–17.0)
MCH: 29.4 pg (ref 26.0–34.0)
MCH: 29.6 pg (ref 26.0–34.0)
MCHC: 33.9 g/dL (ref 30.0–36.0)
MCHC: 34.2 g/dL (ref 30.0–36.0)
MCV: 86.6 fL (ref 78.0–100.0)
MCV: 86.6 fL (ref 78.0–100.0)
Platelets: 187 10*3/uL (ref 150–400)
Platelets: 198 10*3/uL (ref 150–400)
RBC: 4.63 MIL/uL (ref 4.22–5.81)
RBC: 4.39 MIL/uL (ref 4.22–5.81)
RDW: 13.7 % (ref 11.5–15.5)
RDW: 13.8 % (ref 11.5–15.5)
WBC: 8.8 10*3/uL (ref 4.0–10.5)
WBC: 8.2 10*3/uL (ref 4.0–10.5)

## 2011-11-02 LAB — BASIC METABOLIC PANEL
BUN: 19 mg/dL (ref 6–23)
BUN: 16 mg/dL (ref 6–23)
CO2: 25 mEq/L (ref 19–32)
CO2: 25 mEq/L (ref 19–32)
Calcium: 9 mg/dL (ref 8.4–10.5)
Calcium: 8.6 mg/dL (ref 8.4–10.5)
Chloride: 106 mEq/L (ref 96–112)
Chloride: 105 mEq/L (ref 96–112)
Creatinine, Ser: 1.17 mg/dL (ref 0.50–1.35)
Creatinine, Ser: 1.08 mg/dL (ref 0.50–1.35)
GFR calc Af Amer: 81 mL/min — ABNORMAL LOW (ref >90)
GFR calc Af Amer: 89 mL/min — ABNORMAL LOW (ref >90)
GFR calc non Af Amer: 70 mL/min — ABNORMAL LOW (ref >90)
GFR calc non Af Amer: 77 mL/min — ABNORMAL LOW (ref >90)
Glucose, Bld: 88 mg/dL (ref 70–99)
Glucose, Bld: 209 mg/dL — ABNORMAL HIGH (ref 70–99)
Potassium: 3.6 mEq/L (ref 3.5–5.1)
Potassium: 3.4 mEq/L — ABNORMAL LOW (ref 3.5–5.1)
Sodium: 140 mEq/L (ref 135–145)
Sodium: 137 mEq/L (ref 135–145)

## 2011-11-02 LAB — HEPARIN LEVEL (UNFRACTIONATED)
Heparin Unfractionated: 0.83 IU/mL — ABNORMAL HIGH (ref 0.30–0.70)
Heparin Unfractionated: 0.48 IU/mL (ref 0.30–0.70)

## 2011-11-02 MED ORDER — SODIUM CHLORIDE 0.9 % IV SOLN
INTRAVENOUS | Status: DC
  Administered 2011-11-03: 04:00:00 via INTRAVENOUS

## 2011-11-02 MED ORDER — ASPIRIN 81 MG PO CHEW
324.0000 mg | CHEWABLE_TABLET | ORAL | Status: AC
  Administered 2011-11-03: 324 mg via ORAL
  Filled 2011-11-02: qty 4

## 2011-11-02 NOTE — Progress Notes (Signed)
ANTICOAGULATION CONSULT NOTE - Follow Up Consult  Pharmacy Consult for Heparin Indication: ACS - awaiting possible PCI on Wed  No Known Allergies  Patient Measurements: Height: 5\' 9"  (175.3 cm) Weight: 298 lb 1 oz (135.2 kg) IBW/kg (Calculated) : 70.7  Heparin dosing weight: 103 kg  Vital Signs: Temp: 98.3 F (36.8 C) (11/20 0400) Temp src: Oral (11/20 0400) BP: 118/69 mmHg (11/20 0600) Pulse Rate: 70  (11/19 2132)  Labs:  Basename 11/02/11 0543 11/01/11 1558 11/01/11 0848 11/01/11 0535 11/01/11 0036 10/31/11 2211 10/31/11 0646 10/31/11 0541  HGB 13.6 -- -- 13.4 -- -- -- --  HCT 40.1 -- -- 39.8 -- -- -- 42.9  PLT 187 -- -- 179 -- -- -- 206  APTT -- -- -- -- -- -- 32 --  LABPROT -- -- -- -- -- -- 14.0 --  INR -- -- -- -- -- -- 1.06 --  HEPARINUNFRC 0.83* -- -- 0.45 -- 0.17* -- --  CREATININE -- -- -- 1.17 -- -- -- 0.92  CKTOTAL -- 577* 686* -- 1072* -- -- --  CKMB -- 9.0* 13.1* -- 25.9* -- -- --  TROPONINI -- 14.73* 17.30* -- >25.00* -- -- --   Estimated Creatinine Clearance: 99.7 ml/min (by C-G formula based on Cr of 1.17).   Assessment: Heparin level = 0.83 - supratherapeutic.  Plans for staged PCI on the 21st as indicated.  No noted bleeding complications.   Goal of Therapy:  Heparin level 0.3-0.7 units/ml   Plan:  1. Decrease IV heparin to 1500 units/hr 2. F/u 6 hr heparin level. 3.  Monitor for s/s of bleeding.  Sindy Guadeloupe 11/02/2011,6:34 AM

## 2011-11-02 NOTE — Progress Notes (Signed)
CARDIAC REHAB PHASE I   PRE:  Rate/Rhythm: 79 SR    BP: sitting 110/66    SaO2:   MODE:  Ambulation: 700 ft   POST:  Rate/Rhythm: 97 SR    BP: sitting 124/68     SaO2:   Tolerated well, no c/o. Feels well. Ed completed. Reinforced risk factor modification. [id number]  Darrick Meigs CES, ACSM

## 2011-11-02 NOTE — Consult Note (Signed)
[facility] NOTE  Pharmacy Consult for heparin Indication: ACS- awaiting possible PCI on Wed   No Known Allergies  Patient Measurements: Height: 5\' 9"  (175.3 cm) Weight: 298 lb 1 oz (135.2 kg) IBW/kg (Calculated) : 70.7  Heparin dosing weight: 103kg  Vital Signs: Temp: 97.3 F (36.3 C) (11/20 1247) Temp src: Axillary (11/20 1247) BP: 157/77 mmHg (11/20 1247) Pulse Rate: 73  (11/20 1247) Intake/Output from previous day: 11/19 0701 - 11/20 0700 In: 2952 [P.O.:1040; I.V.:665] Out: 3826 [Urine:3825; Stool:1] Intake/Output from this shift: Total I/O In: 305 [P.O.:240; I.V.:65] Out: -   Labs:  Basename 11/02/11 0543 11/01/11 0535 10/31/11 0646 10/31/11 0541  WBC 8.8 9.2 -- 10.8*  HGB 13.6 13.4 -- 14.9  HCT 40.1 39.8 -- 42.9  PLT 187 179 -- 206  APTT -- -- 32 --  INR -- -- 1.06 --  CREATININE 1.17 1.17 -- 0.92  LABCREA -- -- -- --  CREATININE 1.17 1.17 -- 0.92  LABCREA -- -- -- --  CREAT24HRUR -- -- -- --  MG -- -- -- --  PHOS -- -- -- --  ALBUMIN -- -- -- 3.3*  PROT -- -- -- 7.3  AST -- -- -- 102*  ALT -- -- -- 24  ALKPHOS -- -- -- 120*  BILITOT -- -- -- 0.6  BILIDIR -- -- -- --  IBILI -- -- -- --   Estimated Creatinine Clearance: 99.7 ml/min (by C-G formula based on Cr of 1.17).   Basename 11/02/11 1129 11/02/11 0730 11/01/11 2130  GLUCAP 162* 128* 147*    Medications:  Scheduled:    . allopurinol  300 mg Oral Daily  . aspirin  324 mg Oral Pre-Cath  . aspirin EC  81 mg Oral Daily  . insulin aspart  0-15 Units Subcutaneous TID WC  . insulin aspart protamine-insulin aspart  40 Units Subcutaneous BID WC  . lisinopril  5 mg Oral Daily  . metoCLOPramide  10 mg Oral Q6H  . metoprolol tartrate  50 mg Oral BID  . pantoprazole  40 mg Oral Daily  . rosuvastatin  40 mg Oral Daily  . Ticagrelor  90 mg Oral BID  . DISCONTD: antiseptic oral rinse  15 mL Mouth Rinse BID  . DISCONTD: sodium chloride  500 mL Intravenous Once   Infusions:      . sodium chloride 10 mL/hr at 11/02/11 0533  . sodium chloride    . heparin 15 mL/hr (11/02/11 0730)  . nitroGLYCERIN Stopped (10/31/11 1800)    Assessment: Patient Active Problem List  Diagnoses  . DIABETES MELLITUS, TYPE II  . HYPERLIPIDEMIA  . HYPERTENSION  . GERD  . ACUTE OSTEOMYELITIS, ANKLE AND FOOT  . STEMI (ST elevation myocardial infarction)  . Presence of stent in anterior descending branch of left coronary artery   Joshua [name] is a 42 YOM who presents 11/18 with STEMI with a stent placed to LAD on 11/18 and a planned repeat PCI with stent placed to RCA tomorrow.  Pharmacy problem list: STEMI/HTN/HLD: No complaints of chest pain or SOB overnight, nitro drip off. Troponin peaked at > 25. Heparin level therapeutic at 0.48 on 1500 units/hr (15 ml/hr). H/H ok at 13.6/40.1, platelets at 187 and stable. No bleeding noted. Pt also on metoprolol 50mg  PO BID, lisinopril 5mg  daily, ASA 81mg , ticagrelor 90mg  BID, rosuvastatin 40mg  daily. HR 72 (69-80 over past 24h), BP 157/77 (106-175/59-77 over past 24h).  T2DM: CBGs <150 x 24hrs on SSI, A1c 11.3  GERD/gastroparesis:  PPI, reglan  Gout:  Allopurinol, SCr 1.17 and stable   Goal of Therapy:  Heparin level 0.3-0.7  Plan:  Continue heparin at current rate Follow up am heparin level Follow H/H, platelets, s/s bleeding, PCI plans  Joshua [name] 11/02/2011,2:40 PM  I have seen and agree with the student's assessment and plan and will co-sign. Joshua [name], [place] R 3:25 PM 11/02/2011

## 2011-11-02 NOTE — Progress Notes (Signed)
Subjective:  Pt S/p STEMI with PCI/Stent to LAD and staged intervention to RCA. No complaints of CP or SOB sx overnight.  Objective:  Vital Signs in the last 24 hours: Temp:  [98.2 F (36.8 C)-99.2 F (37.3 C)] 98.3 F (36.8 C) (11/20 0400) Pulse Rate:  [70-77] 70  (11/19 2132) Resp:  [16-20] 18  (11/20 0600) BP: (88-134)/(50-98) 118/69 mmHg (11/20 0600) SpO2:  [95 %-100 %] 97 % (11/20 0400) Weight:  [135.2 kg (298 lb 1 oz)] 298 lb 1 oz (135.2 kg) (11/20 0400)  Intake/Output from previous day: 11/19 0701 - 11/20 0700 In: 1678 [P.O.:1040; I.V.:638] Out: 3826 [Urine:3825; Stool:1] Intake/Output from this shift: Total I/O In: 634 [P.O.:320; I.V.:314] Out: 2475 [Urine:2475]  Physical Exam: PE not performed  Lab Results:  Basename 11/02/11 0543 11/01/11 0535  WBC 8.8 9.2  HGB 13.6 13.4  PLT 187 179    Basename 11/01/11 0535 10/31/11 0541  NA 135 133*  K 3.8 4.1  CL 99 95*  CO2 25 23  GLUCOSE 276* 344*  BUN 18 14  CREATININE 1.17 0.92    Basename 11/01/11 1558 11/01/11 0848  TROPONINI 14.73* 17.30*   Hepatic Function Panel  Basename 10/31/11 0541  PROT 7.3  ALBUMIN 3.3*  AST 102*  ALT 24  ALKPHOS 120*  BILITOT 0.6  BILIDIR --  IBILI --   No results found for this basename: CHOL in the last 72 hours No results found for this basename: PROTIME in the last 72 hours  Imaging: Dg Chest Portable 1 View  10/31/2011  *RADIOLOGY REPORT*  Clinical Data: Epigastric pain, myocardial infarction.  PORTABLE CHEST - 1 VIEW  Comparison: 10/30/2011  Findings: Cardiomegaly with central vascular congestion.  Mild interstitial prominence.  Mild retrocardiac linear opacity may reflect scarring or atelectasis.  No pleural effusion or pneumothorax.  No acute osseous abnormality.  IMPRESSION: Cardiomegaly with mild interstitial edema pattern.  Original Report Authenticated By: Suanne Marker, M.D.    Cardiac Studies:  Assessment/Plan:  1) S/p STEMI with PCI/Stent to LAD,  staged intervention to RCA to follow 2) DM 3) HTN 4) HLD  LOS: 2 days    Joshua [name],Joshua [name] E 11/02/2011, 6:24 AM  Agree with note written by Patriciaann Clan [facility]  Day # 2 Anterior STEMI treated with PCI and stenting of LAD. No CP. Exam benign. Groin OK. Will transfer to TCU. Plan staged PCI to RCA by Dr. Leda Gauze tomorrow.  Lorretta Harp 11/02/2011 8:42 AM

## 2011-11-03 ENCOUNTER — Encounter (HOSPITAL_COMMUNITY)
Admission: EM | Disposition: A | Discharge status: Home / Self Care | Payer: Self-pay | Source: Home / Self Care | Attending: Cardiovascular Disease

## 2011-11-03 ENCOUNTER — Other Ambulatory Visit: Payer: Self-pay

## 2011-11-03 HISTORY — PX: PERCUTANEOUS CORONARY STENT INTERVENTION (PCI-S): SHX5485

## 2011-11-03 HISTORY — PX: LEFT HEART CATH: SHX5478

## 2011-11-03 LAB — POCT ACTIVATED CLOTTING TIME: Activated Clotting Time: 435 seconds

## 2011-11-03 LAB — GLUCOSE, CAPILLARY
Glucose-Capillary: 113 mg/dL — ABNORMAL HIGH (ref 70–99)
Glucose-Capillary: 107 mg/dL — ABNORMAL HIGH (ref 70–99)
Glucose-Capillary: 96 mg/dL (ref 70–99)
Glucose-Capillary: 101 mg/dL — ABNORMAL HIGH (ref 70–99)
Glucose-Capillary: 180 mg/dL — ABNORMAL HIGH (ref 70–99)

## 2011-11-03 LAB — CBC
HCT: 37.3 % — ABNORMAL LOW (ref 39.0–52.0)
Hemoglobin: 12.1 g/dL — ABNORMAL LOW (ref 13.0–17.0)
MCH: 28.1 pg (ref 26.0–34.0)
MCHC: 32.4 g/dL (ref 30.0–36.0)
MCV: 86.7 fL (ref 78.0–100.0)
Platelets: 188 10*3/uL (ref 150–400)
RBC: 4.3 MIL/uL (ref 4.22–5.81)
RDW: 13.7 % (ref 11.5–15.5)
WBC: 7 10*3/uL (ref 4.0–10.5)

## 2011-11-03 SURGERY — PERCUTANEOUS CORONARY STENT INTERVENTION (PCI-S)
Anesthesia: LOCAL | Site: Groin | Laterality: Left

## 2011-11-03 MED ORDER — ACETAMINOPHEN 325 MG PO TABS: 650.0000 mg | ORAL_TABLET | Freq: Four times a day (QID) | ORAL | Status: DC | PRN

## 2011-11-03 MED ORDER — MORPHINE SULFATE 2 MG/ML IJ SOLN: 2.0000 mg | INTRAMUSCULAR | Status: DC | PRN

## 2011-11-03 MED ORDER — MORPHINE SULFATE 4 MG/ML IJ SOLN
INTRAMUSCULAR | Status: AC
  Administered 2011-11-03: 4 mg
  Filled 2011-11-03: qty 1

## 2011-11-03 MED ORDER — MIDAZOLAM HCL 2 MG/2ML IJ SOLN
INTRAMUSCULAR | Status: AC
  Filled 2011-11-03: qty 2

## 2011-11-03 MED ORDER — TICAGRELOR 90 MG PO TABS
90.0000 mg | ORAL_TABLET | Freq: Two times a day (BID) | ORAL | Status: DC
  Filled 2011-11-03: qty 1

## 2011-11-03 MED ORDER — BIVALIRUDIN 250 MG IV SOLR
INTRAVENOUS | Status: AC
  Filled 2011-11-03: qty 250

## 2011-11-03 MED ORDER — ZOLPIDEM TARTRATE 5 MG PO TABS: 10.0000 mg | ORAL_TABLET | Freq: Every evening | ORAL | Status: DC | PRN

## 2011-11-03 MED ORDER — FENTANYL CITRATE 0.05 MG/ML IJ SOLN
INTRAMUSCULAR | Status: AC
  Filled 2011-11-03: qty 2

## 2011-11-03 MED ORDER — ASPIRIN EC 81 MG PO TBEC
81.0000 mg | DELAYED_RELEASE_TABLET | Freq: Every day | ORAL | Status: DC
  Administered 2011-11-04: 81 mg via ORAL
  Filled 2011-11-03 (×2): qty 1

## 2011-11-03 MED ORDER — SODIUM CHLORIDE 0.9 % IV SOLN
1.0000 mL/kg/h | INTRAVENOUS | Status: AC
  Administered 2011-11-03 (×2): 1 mL/kg/h via INTRAVENOUS

## 2011-11-03 MED ORDER — ONDANSETRON HCL 4 MG/2ML IJ SOLN: 4.0000 mg | Freq: Four times a day (QID) | INTRAMUSCULAR | Status: DC | PRN

## 2011-11-03 NOTE — Progress Notes (Signed)
ANTICOAGULATION CONSULT NOTE - Follow Up Consult  Pharmacy Consult for Heparin Indication: ACS - awaiting possible PCI on Wed  No Known Allergies  Patient Measurements: Height: 5\' 9"  (175.3 cm) Weight: 297 lb 9.9 oz (135 kg) IBW/kg (Calculated) : 70.7  Heparin dosing weight: 103 kg  Vital Signs: Temp: 98.2 F (36.8 C) (11/21 0800) Temp src: Oral (11/21 0800) BP: 128/75 mmHg (11/21 0400) Pulse Rate: 73  (11/20 2142)  Labs:  Basename 11/03/11 0730 11/02/11 1515 11/02/11 1257 11/02/11 0543 11/01/11 1558 11/01/11 0848 11/01/11 0535 11/01/11 0036  HGB 12.1* 13.0 -- -- -- -- -- --  HCT 37.3* 38.0* -- 40.1 -- -- -- --  PLT 188 198 -- 187 -- -- -- --  APTT -- -- -- -- -- -- -- --  LABPROT -- -- -- -- -- -- -- --  INR -- -- -- -- -- -- -- --  HEPARINUNFRC -- -- 0.48 0.83* -- -- 0.45 --  CREATININE -- 1.08 -- 1.17 -- -- 1.17 --  CKTOTAL -- -- -- -- 577* 686* -- 1072*  CKMB -- -- -- -- 9.0* 13.1* -- 25.9*  TROPONINI -- -- -- -- 14.73* 17.30* -- >25.00*   Estimated Creatinine Clearance: 107.9 ml/min (by C-G formula based on Cr of 1.08).   Assessment: Heparin level = Drawn in purple top tube and not accurate.  Plans for staged PCI today noted.  He is without noted bleeding complications and since cath is scheduled for later this morning, will not reorder a heparin level since he was therapeutic yesterday at current rate.   Goal of Therapy:  Heparin level 0.3-0.7 units/ml   Plan:  1. Decrease IV heparin to 1500 units/hr 2. F/u after cath 3.  Monitor for s/s of bleeding.  Rober Minion Faye 11/03/2011,9:14 AM

## 2011-11-03 NOTE — Progress Notes (Signed)
UR Completed.  Sami Roes Jane [phone] 11/03/2011  

## 2011-11-03 NOTE — Progress Notes (Signed)
CARDIAC REHAB PHASE I   PRE:  Rate/Rhythm: 64 SR  BP:  Supine:  Sitting: 97/64  Standing:    SaO2:  MODE:  Ambulation: 1050 ft   POST:  Rate/Rhythem: 81 SR  BP:  Supine:   Sitting: 147/83  Standing:    SaO2:  1030-1050 Tolerated ambulation well without c/o of cp or SOB. Gait steady. To bed after walk with call light in reach.  Joshua [name]

## 2011-11-03 NOTE — Brief Op Note (Signed)
PCI  Note:  RCA  ELLEN MAYOL, 55 y.o., male  Widely patent LAD stent from 10/1811 acute procedure.  Successful PCI to RCA.  DICTATION # [id number], [id number]  Laurencia Roma A, 11/03/2011, 2:07 PM

## 2011-11-03 NOTE — Progress Notes (Signed)
Site area: left groin  Site Prior to Removal:  Level 0  Pressure Applied For 40 MINUTES    Minutes Beginning at 0605 (ended at [phone])  Manual:   yes  Patient Status During Pull:  VSS  Post Pull Groin Site:  Level 0  Post Pull Instructions Given:  yes  Post Pull Pulses Present:  yes  Dressing Applied:  yes  Comments:

## 2011-11-03 NOTE — Progress Notes (Signed)
Subjective:  No Chest pain.  Day s/p STEMI with PCI of LAD with planned PCI to RCA today.  Objective:  Vital Signs in the last 24 hours: Temp:  [97.3 F (36.3 C)-99.5 F (37.5 C)] 98.2 F (36.8 C) (11/21 0800) Pulse Rate:  [55-76] 73  (11/20 2142) Resp:  [18-20] 18  (11/21 0400) BP: (106-157)/(58-79) 128/75 mmHg (11/21 0400) SpO2:  [95 %-99 %] 98 % (11/21 0800) Weight:  [135 kg (297 lb 9.9 oz)] 297 lb 9.9 oz (135 kg) (11/20 1405)  Intake/Output from previous day: 11/20 0701 - 11/21 0700 In: 1555 [P.O.:840; I.V.:715] Out: 3150 [Urine:3150]  Scheduled:   . allopurinol  300 mg Oral Daily  . aspirin  324 mg Oral Pre-Cath  . aspirin EC  81 mg Oral Daily  . insulin aspart  0-15 Units Subcutaneous TID WC  . insulin aspart protamine-insulin aspart  40 Units Subcutaneous BID WC  . lisinopril  5 mg Oral Daily  . metoCLOPramide  10 mg Oral Q6H  . metoprolol tartrate  50 mg Oral BID  . pantoprazole  40 mg Oral Daily  . rosuvastatin  40 mg Oral Daily  . Ticagrelor  90 mg Oral BID   Continuous:   . sodium chloride 10 mL/hr at 11/02/11 0533  . sodium chloride 50 mL/hr at 11/03/11 0700  . heparin 1,500 Units/hr (11/02/11 2139)  . nitroGLYCERIN Stopped (10/31/11 1800)   EXH:BZJIRCVELFYBO, acetaminophen, diazepam, HYDROcodone-acetaminophen, morphine, nitroGLYCERIN, ondansetron (ZOFRAN) IV, zolpidem  Physical Exam: General appearance: alert, cooperative and no distress Neck: no adenopathy, no carotid bruit, no JVD, supple, symmetrical, trachea midline and thyroid not enlarged, symmetric, no tenderness/mass/nodules Lungs: clear to auscultation bilaterally Heart: regular rate and rhythm, S1, S2 normal, no murmur, click, rub or gallop Abdomen: soft, non-tender; bowel sounds normal; no masses,  no organomegaly Extremities: extremities normal, atraumatic, no cyanosis or edema Pulses: 2+ and symmetric   Rate: 75  Rhythm: normal sinus rhythm  Lab Results:  Basename 11/03/11 0730  11/02/11 1515  WBC 7.0 8.2  HGB 12.1* 13.0  PLT 188 198    Basename 11/02/11 1515 11/02/11 0543  NA 137 140  K 3.4* 3.6  CL 105 106  CO2 25 25  GLUCOSE 209* 88  BUN 16 19  CREATININE 1.08 1.17    Basename 11/01/11 1558 11/01/11 0848  TROPONINI 14.73* 17.30*   Hepatic Function Panel No results found for this basename: PROT,ALBUMIN,AST,ALT,ALKPHOS,BILITOT,BILIDIR,IBILI in the last 72 hours No results found for this basename: CHOL in the last 72 hours No results found for this basename: INR in the last 72 hours  Imaging: No results found.  Cardiac Studies:  Assessment/Plan:   Principal Problem:  *STEMI (ST elevation myocardial infarction) Active Problems:  Presence of stent in anterior descending branch of left coronary artery Diabetes Mellitus HTN  No recurrent chest pain.  Plan elective PCI today to high grade 90% RCA stenosis.   [name], MD, [facility] 11/03/2011, 9:11 AM

## 2011-11-03 NOTE — Cardiovascular Report (Signed)
[name], [name] NO.:  [id number]  MEDICAL RECORD NO.:  [id number]  LOCATION:  2503                         FACILITY:  [facility]  PHYSICIAN:  [name], M.D.     DATE OF BIRTH:  Jan 11, 1956  DATE OF PROCEDURE:  11/03/2011 DATE OF DISCHARGE:                           CARDIAC CATHETERIZATION   INDICATIONS:  [name] is a 55 year old African American gentleman who has a 30-year history of hypertension, 23-year history of diabetes mellitus.  He presented to [facility] on October 31, 2011, with an ST-segment elevation, anterolateral myocardial infarction. He was taken acutely to the cardiac catheterizations laboratory and underwent successful stenting of a subtotally occluded proximal to mid LAD.  At that time, he also was noted to have 90-95% focal mid right coronary artery stenosis.  The patient had a 3.0 x 32 mm PROMUS DES stent inserted into his LAD to cover 2 lesions.  Subsequently,  he has done remarkably well.  He now presents for staged PCI to his high-grade residual right coronary artery stenosis of 90-95% prior to the anticipated discharge tomorrow.  PROCEDURE:  After premedication with Versed 2 mg plus fentanyl 50 mcg, the patient was prepped and draped in usual fashion.  Because of hematoma present in the right femoral artery, this catheterization was done via the left femoral artery.  A 6-French sheath was inserted. Initially, a 5-French left diagnostic catheter was inserted, which confirmed excellent patency of the recently placed stent in the left anterior descending artery.  Attention was then directed at the right coronary artery.  A 6-French right guide was inserted.  Scout angiography reconfirmed the 90-95% proximal to mid right coronary artery stenosis in a large right coronary artery system.  The patient received Angiomax.  He has been on Brilinta since this intervention and had received his morning dose today.  A  Prowater wire was advanced down the right coronary artery after demonstration of therapeutic anticoagulation.  The patient received several doses of intracoronary nitroglycerin throughout the procedure.  Predilatation was done utilizing a 2.5 x 12 mm TREK balloon.  A 3.0 x 18 mm Resolute DES stent was then positioned, deployed, and dilated x2 up to 14 atmospheres.  A 3.5 x15 mm noncompliant TREK was used for poststent dilatation up to 3.49 mm.  Scout angiography confirmed an excellent angiographic result. The arterial sheath was sutured in place with plans for sheath removal later today.  HEMODYNAMIC DATA:  Central aortic pressure was 149/92.  ANGIOGRAPHIC DATA:  Left main coronary artery was angiographically normal and bifurcated into the LAD and left circumflex system.  There was a smooth 20% ostial LAD narrowing.  After the first septal perforating artery and before the first diagonal vessel, there was smooth 20-30% narrowing.  The previously placed stent in the mid LAD, which was a 3.0 x 32 mm PROMUS DES stent, was widely patent without any residual narrowing.  There was mild lumen irregularity in the small distal LAD system.  The circumflex vessel had mild 10-20% narrowing in its mid segment.  The right coronary artery had 95% mid stenosis just proximal to the anterior marginal branch.  Following successful PCI/stenting with insertion of a  3.0 x 18 mm Resolute DES stent post dilated 3.5 mm to approximately 3.5 mm, the 95% stenosis was reduced to 0%.  There was brisk TIMI-3 flow.  There is no evidence for dissection.  IMPRESSION: 1. Successful staged PCI to a 90-95% mid right coronary artery     stenosis being reduced to 0% with insertion of a     3.0 x 18 mm Resolute DES stent post dilated 3.49 mm. 2. Widely patent LAD stent from the prior intervention in the setting     of ST-segment elevation myocardial infarction on October 31, 2011.           ______________________________ [name], M.D.     TK/MEDQ  D:  11/03/2011  T:  11/03/2011  Job:  [id number]  cc:   Jacelyn Pi, M.D.

## 2011-11-04 ENCOUNTER — Other Ambulatory Visit: Payer: Self-pay

## 2011-11-04 DIAGNOSIS — Z955 Presence of coronary angioplasty implant and graft: Secondary | ICD-10-CM

## 2011-11-04 LAB — BASIC METABOLIC PANEL
BUN: 13 mg/dL (ref 6–23)
CO2: 25 mEq/L (ref 19–32)
Calcium: 8.3 mg/dL — ABNORMAL LOW (ref 8.4–10.5)
Chloride: 106 mEq/L (ref 96–112)
Creatinine, Ser: 1.06 mg/dL (ref 0.50–1.35)
GFR calc Af Amer: >90 mL/min (ref >90)
GFR calc non Af Amer: 78 mL/min — ABNORMAL LOW (ref >90)
Glucose, Bld: 133 mg/dL — ABNORMAL HIGH (ref 70–99)
Potassium: 3.6 mEq/L (ref 3.5–5.1)
Sodium: 138 mEq/L (ref 135–145)

## 2011-11-04 LAB — GLUCOSE, CAPILLARY: Glucose-Capillary: 122 mg/dL — ABNORMAL HIGH (ref 70–99)

## 2011-11-04 MED ORDER — NITROGLYCERIN 0.4 MG SL SUBL: 0.4000 mg | SUBLINGUAL_TABLET | SUBLINGUAL | Status: DC | PRN

## 2011-11-04 MED ORDER — LISINOPRIL 10 MG PO TABS
10.0000 mg | ORAL_TABLET | Freq: Every day | ORAL | Status: DC
  Administered 2011-11-04: 10 mg via ORAL
  Filled 2011-11-04: qty 1

## 2011-11-04 MED ORDER — TICAGRELOR 90 MG PO TABS: 90.0000 mg | ORAL_TABLET | Freq: Two times a day (BID) | ORAL | Status: DC

## 2011-11-04 MED ORDER — TICAGRELOR 90 MG PO TABS
90.0000 mg | ORAL_TABLET | Freq: Two times a day (BID) | ORAL | Status: DC
  Administered 2011-11-04: 90 mg via ORAL
  Filled 2011-11-04 (×2): qty 1

## 2011-11-04 MED ORDER — ACETAMINOPHEN 325 MG PO TABS: 650.0000 mg | ORAL_TABLET | Freq: Four times a day (QID) | ORAL | Status: AC | PRN

## 2011-11-04 MED ORDER — ROSUVASTATIN CALCIUM 40 MG PO TABS: 40.0000 mg | ORAL_TABLET | Freq: Every day | ORAL | Status: DC

## 2011-11-04 MED ORDER — METOPROLOL TARTRATE 50 MG PO TABS: 50.0000 mg | ORAL_TABLET | Freq: Two times a day (BID) | ORAL | Status: DC

## 2011-11-04 MED ORDER — LISINOPRIL 10 MG PO TABS: 10.0000 mg | ORAL_TABLET | Freq: Every day | ORAL | Status: DC

## 2011-11-04 MED ORDER — ASPIRIN 81 MG PO TBEC: 81.0000 mg | DELAYED_RELEASE_TABLET | Freq: Every day | ORAL | Status: AC

## 2011-11-04 MED ORDER — METFORMIN HCL 500 MG PO TABS: 500.0000 mg | ORAL_TABLET | Freq: Every day | ORAL | Status: DC

## 2011-11-04 MED FILL — Dextrose Inj 5%: INTRAVENOUS | Qty: 50 | Status: AC

## 2011-11-04 MED FILL — Dextrose Inj 5%: INTRAVENOUS | Qty: 100 | Status: AC

## 2011-11-04 NOTE — Progress Notes (Signed)
   CARE MANAGEMENT NOTE 11/04/2011  Patient:  Joshua [name], Joshua [name]   Account Number:  [id number]  Date Initiated:  11/04/2011  Documentation initiated by:  SIMMONS,Colie Josten  Subjective/Objective Assessment:   consult received for medication- Brilinta assistance/ locate pharmacy that has in stock. (pt frequents [facility]- but not in stock there nor [place])     Action/Plan:   located pharmacy on [address] that has in stock.   Anticipated DC Date:  11/04/2011   Anticipated DC Plan:  HOME/SELF CARE  In-house referral  NA      DC Planning Services  CM consult      PAC Choice  NA   Choice offered to / List presented to:  NA   DME arranged  NA      DME agency  NA     [place] arranged  NA      [place] agency  NA   Status of service:  Completed, signed off Medicare Important Message given?   (If response is "NO", the following Medicare IM given date fields will be blank) Date Medicare IM given:   Date Additional Medicare IM given:    Discharge Disposition:  HOME/SELF CARE  Per UR Regulation:  Reviewed for med. necessity/level of care/duration of stay  Comments:

## 2011-11-04 NOTE — Progress Notes (Addendum)
Patient ID: Joshua [name], male   DOB: 09-06-1956, 55 y.o.   MRN: [id number]  Day 4 s/p Anterior STEMI with PCI of LAD,  with staged PCI to RCA yesterday. Subjective:  No Chest pain.  Ambulated yesterday without difficulty. R groin hematoma - much improved, no pain.  Left groin stable  Objective:  Vital Signs in the last 24 hours: Temp:  [98.1 F (36.7 C)-99.1 F (37.3 C)] 98.1 F (36.7 C) (11/22 0742) Pulse Rate:  [65-72] 65  (11/22 0742) Resp:  [18-20] 18  (11/22 0742) BP: (122-155)/(65-80) 153/80 mmHg (11/22 0742) SpO2:  [97 %-100 %] 97 % (11/22 0742) Weight:  [134.7 kg (296 lb 15.4 oz)] 296 lb 15.4 oz (134.7 kg) (11/22 8850)  Intake/Output from previous day: 11/21 0701 - 11/22 0700 In: 2454.2 [P.O.:320; I.V.:2134.2] Out: 1000 [Urine:1000]  Scheduled:    . allopurinol  300 mg Oral Daily  . aspirin EC  81 mg Oral Daily  . bivalirudin      . bivalirudin      . fentaNYL      . insulin aspart  0-15 Units Subcutaneous TID WC  . insulin aspart protamine-insulin aspart  40 Units Subcutaneous BID WC  . lisinopril  5 mg Oral Daily  . metoCLOPramide  10 mg Oral Q6H  . metoprolol tartrate  50 mg Oral BID  . midazolam      . pantoprazole  40 mg Oral Daily  . rosuvastatin  40 mg Oral Daily  . Ticagrelor  90 mg Oral BID  . DISCONTD: aspirin EC  81 mg Oral Daily  . DISCONTD: morphine      . DISCONTD: Ticagrelor  90 mg Oral BID  . DISCONTD: Ticagrelor  90 mg Oral BID   YDX:AJOINOMVEHMCN, HYDROcodone-acetaminophen, morphine injection, nitroGLYCERIN, ondansetron (ZOFRAN) IV, zolpidem, DISCONTD: acetaminophen, DISCONTD: acetaminophen, DISCONTD: diazepam, DISCONTD: morphine, DISCONTD: ondansetron (ZOFRAN) IV, DISCONTD: zolpidem  Physical Exam: General appearance: alert, cooperative, appears stated age, no distress and morbidly obese Neck: no adenopathy, no carotid bruit, no JVD, supple, symmetrical, trachea midline and thyroid not enlarged, symmetric, no  tenderness/mass/nodules Lungs: clear to auscultation bilaterally, normal percussion bilaterally and non-labored.  No W/R/R Heart: regular rate and rhythm, S1, S2 normal, no murmur, click, rub or gallop Abdomen: soft, non-tender; bowel sounds normal; no masses,  no organomegaly and obese Extremities: extremities normal, atraumatic, no cyanosis or edema and R groind ecchymoses resolving, hematoma significantly reduced in size when compared to line drawn on previous assessment. Non-tender. Pulses: 2+ and symmetric Skin: Skin color, texture, turgor normal. No rashes or lesions Neurologic: Alert and oriented X 3, normal strength and tone. Normal symmetric reflexes. Normal coordination and gait   Rate: 75  Rhythm: normal sinus rhythm  Lab Results:  Basename 11/03/11 0730 11/02/11 1515  WBC 7.0 8.2  HGB 12.1* 13.0  PLT 188 198    Basename 11/04/11 0530 11/02/11 1515  NA 138 137  K 3.6 3.4*  CL 106 105  CO2 25 25  GLUCOSE 133* 209*  BUN 13 16  CREATININE 1.06 1.08    Basename 11/01/11 1558  TROPONINI 14.73*    Imaging: No results found.  Cardiac Studies:  Assessment/Plan:   Principal Problem:  *STEMI (ST elevation myocardial infarction) Active Problems:  Presence of stent in anterior descending branch of left coronary artery Placement of stent in RCA as staged PCI on 11/21. Diabetes Mellitus - On Lantus & SSI (Hgb A1C ~11, poorly controlled) HTN - BP a bit high today - increase ACE-I  dose  Hemodynamically stable with no further chest pain.  Bilateral groins stable - R groin much improved. Normal EF on LV gram.  PLAN:  Increase Lisinopril to $RemoveBefor'10mg'ZTlUWOefEFCY$  daily, on good dose of BB  Needs to f/u with PCP for improved DM control.  Will continue with 70/30 Insulin, start metformin in 48 hr.  D/C today on dual Antiplatelet (need to ensure that Brilinta available @ pharmacy), 81 mg ASA, Crestor.  F/u with Dr. Corky Downs  HARDING,DAVID W 11/04/2011 9:37 AM

## 2011-11-04 NOTE — Discharge Summary (Signed)
Physician Discharge Summary  Patient ID: Joshua [name] MRN: [id number] DOB/AGE: [birth year] 53 y.o.  Admit date: 10/31/2011 Discharge date: 11/04/2011  Discharge Diagnoses:  Principal Problem:  *STEMI (ST elevation myocardial infarction) Active Problems:  DIABETES MELLITUS, TYPE II  Presence of stent in anterior descending branch of left coronary artery  Stented coronary artery  HYPERLIPIDEMIA  HYPERTENSION   Discharged Condition: good  Hospital Course: 55 year old African American male presented to the emergency room 10/31/2011 with one day of chest pain and nausea and vomiting. He had a history of 3-4 weeks of these symptoms off and on. The intensity of the symptoms progressed and occurred both at rest and with activity. On arrival in the emergency room his pain was 8-9/10 on a 1-10 scale. Originally had been discharged from the emergency room but didn't re\re presented to the emergency room with increased pain he denied shortness of breath and no prior history of coronary disease that he was aware of. He had had a stress test a few years prior to this admission.  His EKG revealed sinus rhythm with hyperacute ST elevation in leads V2 through V5, 1, aVL. With slight progression on the repeat EKGs. He was then taken emergently to the cardiac cath lab by Dr. [name] where he was found to have subtotal stenosis involving the mid LAD artery. Additionally had 90% focal mid RCA stenosis. Dr. Claiborne Billings performs PTCA and stenting with a prominence drug-eluting stent to the LAD lesion. EF was found to be 50-55% on cardiac cath with hypokinesis involving the mid and distal anterior lateral wall with severe global hypo-to akinesis involving the apical inferior segment.  He tolerated the procedure well and plans were to bring him back for staged intervention to the right coronary artery stenosis. He was admitted to the 2900 A unit.  His pain was resolved,but his glucose ranged 400-300. No LOC regular  insulin was given as well as adjusting his medications to his home 70/30 insulin.  At that point his glucose was controlled though his hemoglobin A1c is greater than 11. Patient stated his glucose was more elevated after had a recent tooth infection. He had a tooth pulled, the infection was resolved but his glucose continued to be elevated.  Metformin is added to his medical regimen on November 24 after 48 hours of his left cath.    Patient was ambulated with cardiac rehabilitation, cardiac education was also given by cardiac rehabilitation.  On 11/03/2011 patient underwent cardiac cath with PTCA and stent to the right coronary artery as previously planned by Dr. [name]. At the time of the second cath, his LAD stent from 10/31/2011 was widely patent.  On the morning of 11/04/2011 patient was stable cath site was without hematoma the patient was ambulating without complications and was ready for discharge home. He was seen and examined by Dr. Ellyn Hack.  Case manager directed the patient we are to obtain his Brilinta he was also given prescription for 30 days free.  He would need to go to [facility] in [place] to others the backorder in [place].  No driving for 2 weeks and no work until evaluated by Dr. Claiborne Billings.    Consults: none  Significant Diagnostic Studies:  Laboratory data: at discharge sodium 138 potassium 3.6 chloride 106 CO2 25 BUN 13 creatinine 1.6 calcium 8.3 glucose 133  Hemoglobin 12.1 hematocrit 37.3 WBC 7.0 platelets 188  Initial troponin was 8.09, and while his cardiac enzymes were ordered every 8 hours x3  there were not done again unstable 12:30 in the morning of 11/01/2011. At that time CK was 1072 MB 25.9 and troponin I greater than 25. Followup CKs were 686, pulse 77. Followup in the 13.1, 9.0, troponin I is 17.30, and 14.73.  Urinalysis: Greater than 1000 mg/dL of glucose, ketones were greater than 80 and protein was greater than 300  Hemoglobin A1c  was 11.3  Radiology: Chest x-ray revealed mild vascular overload  EKGs: initial EKG with ST elevation in anterior lateral leads.  Followup EKGs with evolutionary changes of an anterior MI. EKG discharge again reveals evolutionary changes in his anterior leads of an anterior MI.  Discharge Exam: Blood pressure 153/80, pulse 65, temperature 98.1 F (36.7 C), temperature source Oral, resp. rate 18, height $RemoveBe'5\' 9"'bJfIXFxZH$  (1.753 m), weight 134.7 kg (296 lb 15.4 oz), SpO2 97.00%.  patient was seen and examined by Dr. Ellyn Hack please see his note for further details of the exam.  Disposition: Home or Self Care   Discharge Medication List as of 11/04/2011 11:23 AM    START taking these medications   Details  acetaminophen (TYLENOL) 325 MG tablet Take 2 tablets (650 mg total) by mouth every 6 (six) hours as needed., Starting 11/04/2011, Until Sun 11/14/11, Print    aspirin EC 81 MG EC tablet Take 1 tablet (81 mg total) by mouth daily., Starting 11/04/2011, Until Fri 11/03/12, Print    lisinopril (PRINIVIL,ZESTRIL) 10 MG tablet Take 1 tablet (10 mg total) by mouth daily., Starting 11/04/2011, Until Fri 11/03/12, Print    metFORMIN (GLUCOPHAGE) 500 MG tablet Take 1 tablet (500 mg total) by mouth daily with breakfast., Starting 11/04/2011, Until Fri 11/03/12, Print    metoprolol (LOPRESSOR) 50 MG tablet Take 1 tablet (50 mg total) by mouth 2 (two) times daily., Starting 11/04/2011, Until Fri 11/03/12, Print    nitroGLYCERIN (NITROSTAT) 0.4 MG SL tablet Place 1 tablet (0.4 mg total) under the tongue every 5 (five) minutes x 3 doses as needed for chest pain., Starting 11/04/2011, Until Fri 11/03/12, Print    rosuvastatin (CRESTOR) 40 MG tablet Take 1 tablet (40 mg total) by mouth daily., Starting 11/04/2011, Until Fri 11/03/12, Print    Ticagrelor (BRILINTA) 90 MG TABS tablet Take 1 tablet (90 mg total) by mouth 2 (two) times daily., Starting 11/04/2011, Until Discontinued, Print      CONTINUE these  medications which have NOT CHANGED   Details  allopurinol (ZYLOPRIM) 300 MG tablet Take 300 mg by mouth daily. , Until Discontinued, Historical Med    esomeprazole (NEXIUM) 40 MG capsule Take 1 capsule (40 mg total) by mouth daily., Starting 10/30/2011, Until Sun 10/29/12, Normal    HYDROcodone-acetaminophen (VICODIN) 5-500 MG per tablet Take 1 tablet by mouth. Every four to six hours as needed. For dental pain, Until Discontinued, Historical Med    insulin NPH-insulin regular (HUMULIN 70/30) (70-30) 100 UNIT/ML injection Inject 40 Units into the skin 2 (two) times daily. , Until Discontinued, Historical Med    metoCLOPramide (REGLAN) 10 MG tablet Take 1 tablet (10 mg total) by mouth every 6 (six) hours. Take every 6 hours prior to meals, Starting 10/30/2011, Until Tue 11/09/11, Normal      STOP taking these medications     Amlodipine-Valsartan-HCTZ (EXFORGE HCT) 10-320-25 MG TABS      atenolol (TENORMIN) 50 MG tablet      atorvastatin (LIPITOR) 40 MG tablet        Follow-up Information    Follow up with [name], MD in  1 week. (Our office will call you with date and time of appt. on Monday.)    Contact information:   [address] [place] [phone]       Follow up with READE,ROBERT ALEXANDER. Call in 4 days. (For better diabetic control)    Contact information:   [facility] [place] [phone]          Signed: Isaiah Serge 11/04/2011, 3:19 PM

## 2012-02-03 ENCOUNTER — Encounter (HOSPITAL_COMMUNITY): Payer: Self-pay

## 2012-02-03 ENCOUNTER — Encounter (HOSPITAL_COMMUNITY)
Admission: RE | Admit: 2012-02-03 | Discharge: 2012-02-03 | Disposition: A | Discharge status: Home / Self Care | Payer: BC Managed Care – PPO | Source: Ambulatory Visit | Attending: Cardiovascular Disease | Admitting: Cardiovascular Disease

## 2012-02-03 DIAGNOSIS — I10 Essential (primary) hypertension: Secondary | ICD-10-CM | POA: Insufficient documentation

## 2012-02-03 DIAGNOSIS — Z9861 Coronary angioplasty status: Secondary | ICD-10-CM | POA: Insufficient documentation

## 2012-02-03 DIAGNOSIS — I252 Old myocardial infarction: Secondary | ICD-10-CM | POA: Insufficient documentation

## 2012-02-03 DIAGNOSIS — E119 Type 2 diabetes mellitus without complications: Secondary | ICD-10-CM | POA: Insufficient documentation

## 2012-02-03 DIAGNOSIS — K219 Gastro-esophageal reflux disease without esophagitis: Secondary | ICD-10-CM | POA: Insufficient documentation

## 2012-02-03 DIAGNOSIS — E785 Hyperlipidemia, unspecified: Secondary | ICD-10-CM | POA: Insufficient documentation

## 2012-02-03 DIAGNOSIS — Z5189 Encounter for other specified aftercare: Secondary | ICD-10-CM | POA: Insufficient documentation

## 2012-02-03 NOTE — Progress Notes (Signed)
Cardiac Rehab Medication Review by a Pharmacist  Does the patient  feel that his/her medications are working for him/her?  yes  Has the patient been experiencing any side effects to the medications prescribed?  no  Does the patient measure his/her own blood pressure or blood glucose at home?  yes   Does the patient have any problems obtaining medications due to transportation or finances?   no  Understanding of regimen: excellent Understanding of indications: excellent Potential of compliance: excellent    Pharmacist comments: Patient did have issues in the past obtaining insulin. Wife has recently started working and he has not had much problems getting his medications as he was before.   Thank you,  Francesca Jewett, PharmD Pager: [phone]  02/03/2012 8:48 AM

## 2012-02-07 ENCOUNTER — Encounter (HOSPITAL_COMMUNITY): Admission: RE | Admit: 2012-02-07 | Payer: BC Managed Care – PPO | Source: Ambulatory Visit

## 2012-02-07 NOTE — Progress Notes (Signed)
Pt arrived to cardiac rehab.  Pt informed that we had not received his order from Dr. Claiborne Billings to start cardiac rehab.  Pt reports he received the two messaged left by rehab staff but had not had a chance to return the call.  Pt instructed to wait until he hears from the rehab staff before returning to rehab.  Pt to bring current  medications with him in order to clarify which medications he is taking.  His list differs from Dr. Evette Georges office visit from 12/2011.  Pt verbalized understanding and plans to call his Dr. Evette Georges office today to help facilitate the order process.

## 2012-02-09 ENCOUNTER — Encounter (HOSPITAL_COMMUNITY)
Admission: RE | Admit: 2012-02-09 | Discharge: 2012-02-09 | Disposition: A | Discharge status: Home / Self Care | Payer: BC Managed Care – PPO | Source: Ambulatory Visit | Attending: Cardiovascular Disease | Admitting: Cardiovascular Disease

## 2012-02-09 LAB — GLUCOSE, CAPILLARY
Glucose-Capillary: 165 mg/dL — ABNORMAL HIGH (ref 70–99)
Glucose-Capillary: 161 mg/dL — ABNORMAL HIGH (ref 70–99)

## 2012-02-09 NOTE — Progress Notes (Signed)
Pt started cardiac rehab today.  Pt tolerated light exercise without difficulty.  Pt with elevated bp prior to exercise and during.  Pt took his am medications around 5:00 after he got off from work.  Unable to progress his workloads due to elevated exercise bp. Will send fax to Dr.  Claiborne Billings for review.  Monitor showed SR with no noted ectopy. Continue to monitor.

## 2012-02-09 NOTE — Progress Notes (Signed)
Joshua [name] 56 y.o. male       Nutrition Screen                                                                    YES  NO Do you live in a nursing home?  X   Do you eat out more than 3 times/week?    X If yes, how many times per week do you eat out?  Do you have food allergies?   X If yes, what are you allergic to?  Have you gained or lost more than 10 lbs without trying?               X If yes, how much weight have you lost and over what time period?  lbs gained or lost over  weeks/month  Do you want to lose weight?    X  If yes, what is a goal weight or amount of weight you would like to lose? 90 lb  Do you eat alone most of the time?   X   Do you eat less than 2 meals/day?  X If yes, how many meals do you eat?  Do you drink more than 3 alcohol drinks/day?  X If yes, how many drinks per day?  Are you having trouble with constipation? * X  If yes, what are you doing to help relieve constipation?  Do you have financial difficulties with buying food?*    X   Are you experiencing regular nausea/ vomiting?*     X   Do you have a poor appetite? *                                        X   Do you have trouble chewing/swallowing? *   X    Pt with diagnoses of:  X GERD          X Dyslipidemia  / HDL< 62 / LDL>70 / High TG      X %  Body fat >goal / Body Mass Index >25 X HTN / BP >120/80 X MI X DM/A1c >6 / CBG >126       Pt Risk Score   6       Diagnosis Risk Score  75       Total Risk Score   81                        X High Risk                Low Risk    HT: 68.25" Ht Readings from Last 1 Encounters:  02/03/12 5' 8.25" (1.734 m)    WT:   294.6 lb (133.9 kg) Wt Readings from Last 3 Encounters:  02/03/12 295 lb 3.1 oz (133.9 kg)  11/04/11 296 lb 15.4 oz (134.7 kg)  11/04/11 296 lb 15.4 oz (134.7 kg)     IBW 70.7 189%IBW BMI 44.6 42.6%body fat  Meds reviewed: Humulin 70/30 40 units BID, Metformin, vitamin D  Past Medical History  Diagnosis Date  . Diabetes mellitus     . Hypertension   .  Hyperlipemia         Activity level: Pt is moderately active   Wt goal: 270-282 lb ( 122.7-128.2 kg) Current tobacco use? No      Food/Drug Interaction? No       Labs:  Lipid Panel  No results found for this basename: chol, trig, hdl, cholhdl, vldl, ldlcalc   Lab Results  Component Value Date   HGBA1C 11.3* 11/01/2011   11/04/11 Glucose 122  LDL goal: < 70      MI and > 2:      HTN, > 56 yo male Estimated Daily Nutrition Needs for: ? wt loss  2000-2500 Kcal , Total Fat 55-70gm, Saturated Fat 15-19 gm, Trans Fat 2.0-2.5 gm,  Sodium less than 1500 mg , Grams of CHO 250-325

## 2012-02-11 ENCOUNTER — Encounter (HOSPITAL_COMMUNITY)
Admission: RE | Admit: 2012-02-11 | Discharge: 2012-02-11 | Disposition: A | Discharge status: Home / Self Care | Payer: BC Managed Care – PPO | Source: Ambulatory Visit | Attending: Cardiovascular Disease | Admitting: Cardiovascular Disease

## 2012-02-11 DIAGNOSIS — K219 Gastro-esophageal reflux disease without esophagitis: Secondary | ICD-10-CM | POA: Insufficient documentation

## 2012-02-11 DIAGNOSIS — E119 Type 2 diabetes mellitus without complications: Secondary | ICD-10-CM | POA: Insufficient documentation

## 2012-02-11 DIAGNOSIS — Z5189 Encounter for other specified aftercare: Secondary | ICD-10-CM | POA: Insufficient documentation

## 2012-02-11 DIAGNOSIS — I10 Essential (primary) hypertension: Secondary | ICD-10-CM | POA: Insufficient documentation

## 2012-02-11 DIAGNOSIS — Z9861 Coronary angioplasty status: Secondary | ICD-10-CM | POA: Insufficient documentation

## 2012-02-11 DIAGNOSIS — E785 Hyperlipidemia, unspecified: Secondary | ICD-10-CM | POA: Insufficient documentation

## 2012-02-11 DIAGNOSIS — I252 Old myocardial infarction: Secondary | ICD-10-CM | POA: Insufficient documentation

## 2012-02-11 LAB — GLUCOSE, CAPILLARY: Glucose-Capillary: 145 mg/dL — ABNORMAL HIGH (ref 70–99)

## 2012-02-14 ENCOUNTER — Encounter (HOSPITAL_COMMUNITY)
Admission: RE | Admit: 2012-02-14 | Discharge: 2012-02-14 | Disposition: A | Discharge status: Home / Self Care | Payer: BC Managed Care – PPO | Source: Ambulatory Visit | Attending: Cardiovascular Disease | Admitting: Cardiovascular Disease

## 2012-02-14 LAB — GLUCOSE, CAPILLARY
Glucose-Capillary: 81 mg/dL (ref 70–99)
Glucose-Capillary: 96 mg/dL (ref 70–99)

## 2012-02-14 NOTE — Progress Notes (Signed)
Joshua [name] 56 y.o. male Nutrition Note Spoke with pt. Pt CBG too low for exercise this am.  Pt works 3rd shift and ate his dinner meal of ground [place] and pasta with a feta cheese sauce at 4:30 am. Pt did not check his CBG before eating.  Pre-exercise CBG 81 mg/dL. Pt surprised he didn't feel light-headed or shaky like he usually does when his blood glucose is "that low." Fifteen minutes after eating peanut butter crackers and drinking lemonade CBG was 97 mg/dL. Per pt, he is taking metocloprimide for gastroparesis. Nutrition Survey reviewed with pt. Pt is following Step 1 of the Therapeutic Lifestyle Changes diet. Per pt 24 hr food record, pt only consumed 1 serving of fruit/vegetable. Pt strongly encouraged to increase fruit/vegetable intake. This Probation officer went over Diabetes Education test results. Weight loss tips discussed.   Nutrition Diagnosis   Food-and nutrition-related knowledge deficit related to lack of exposure to information as related to diagnosis of: ? CVD ? DM (A1c 11.3)   Obesity related to excessive energy intake as evidenced by a BMI of 44.6  Nutrition RX/   Estimated Daily Nutrition Needs for: ? wt loss  2000-2500 Kcal , Total Fat 55-70gm, Saturated Fat 15-19 gm, Trans Fat 2.0-2.5 gm,  Sodium less than 1500 mg , Grams of CHO 250-325     Nutrition Intervention   Benefits of adopting Therapeutic Lifestyle Changes discussed when Medficts reviewed.   Pt to attend the Portion Distortion class   Pt to attend the  ? Nutrition I class                     ? Nutrition II class        ? Diabetes Blitz class                                     ? Diabetes Q & A class   Continue client-centered nutrition education by RD, as part of interdisciplinary care. Goal(s)   Pt to identify and limit food sources of saturated fat, trans fat, and cholesterol   Pt to identify food quantities necessary to achieve: ? wt loss to a goal wt of 270-282 lb (122.7-128.2 kg) at graduation from cardiac  rehab.    Pt to describe the benefit of including fruits, vegetables, whole grains, and low-fat dairy products in a heart healthy meal plan.   CBG concentrations in the normal range or as close to normal as is safely possible. Monitor and Evaluate progress toward nutrition goal with team.

## 2012-02-14 NOTE — Progress Notes (Signed)
Pt BP remain elevated. BP decrease slightly with sitting. 150/90.  Pt took his medications this morning after getting off from work.  Will not be able to proceed with exercise today.  Await response from Dr. Claiborne Billings and Dr. Bubba Camp.  Detailed notes regarding low quality of life scores and elevated blood pressure sent to both physicians on 3/1/13Blood sugar  81. Pt given snack of peanut butter, crackers and lemonade.  Please see Dietician note for detailed consult information.

## 2012-02-15 LAB — GLUCOSE, CAPILLARY: Glucose-Capillary: 77 mg/dL (ref 70–99)

## 2012-02-16 ENCOUNTER — Encounter (HOSPITAL_COMMUNITY)
Admission: RE | Admit: 2012-02-16 | Discharge: 2012-02-16 | Disposition: A | Discharge status: Home / Self Care | Payer: BC Managed Care – PPO | Source: Ambulatory Visit | Attending: Cardiovascular Disease | Admitting: Cardiovascular Disease

## 2012-02-16 LAB — GLUCOSE, CAPILLARY: Glucose-Capillary: 211 mg/dL — ABNORMAL HIGH (ref 70–99)

## 2012-02-16 NOTE — Progress Notes (Signed)
Pt bp remains elevated.  Pt today omitted taking his bp medications before exercise to see what his blood pressure would do.  Pt did have better bp but remain well above normal limits.  Pt is frustrated that his blood pressures are not getting any better.  Called and left message with wanda Dr. Evette Georges nurse.  [name] returned call.  [name] plans to talk to patient to verify which medication he is taking to clarify since there is a discrepancy about the aldactone.  Requested parameters for pt during exercise.

## 2012-02-18 ENCOUNTER — Encounter (HOSPITAL_COMMUNITY): Payer: BC Managed Care – PPO

## 2012-02-21 ENCOUNTER — Encounter (HOSPITAL_COMMUNITY)
Admission: RE | Admit: 2012-02-21 | Discharge: 2012-02-21 | Disposition: A | Discharge status: Home / Self Care | Payer: BC Managed Care – PPO | Source: Ambulatory Visit | Attending: Cardiovascular Disease | Admitting: Cardiovascular Disease

## 2012-02-21 LAB — GLUCOSE, CAPILLARY: Glucose-Capillary: 151 mg/dL — ABNORMAL HIGH (ref 70–99)

## 2012-02-21 NOTE — Progress Notes (Signed)
Bp remain elevated. Pt is taking spironolactone 12.5mg  as directed. Pt pre exercise bp 160/100.  Pt sat down and rested for 5 minutes.  Recheck 158/84.  Pt able to proceed with exercise with  limitations of workload and equipment choice.  Rehab report sent to Dr. Alyson Ingles, pt primary md for review.  Await response.

## 2012-02-23 ENCOUNTER — Encounter (HOSPITAL_COMMUNITY)
Admission: RE | Admit: 2012-02-23 | Discharge: 2012-02-23 | Disposition: A | Discharge status: Home / Self Care | Payer: BC Managed Care – PPO | Source: Ambulatory Visit | Attending: Cardiovascular Disease | Admitting: Cardiovascular Disease

## 2012-02-23 LAB — GLUCOSE, CAPILLARY: Glucose-Capillary: 132 mg/dL — ABNORMAL HIGH (ref 70–99)

## 2012-02-25 ENCOUNTER — Encounter (HOSPITAL_COMMUNITY)
Admission: RE | Admit: 2012-02-25 | Discharge: 2012-02-25 | Disposition: A | Discharge status: Home / Self Care | Payer: BC Managed Care – PPO | Source: Ambulatory Visit | Attending: Cardiovascular Disease | Admitting: Cardiovascular Disease

## 2012-02-25 LAB — GLUCOSE, CAPILLARY
Glucose-Capillary: 275 mg/dL — ABNORMAL HIGH (ref 70–99)
Glucose-Capillary: 309 mg/dL — ABNORMAL HIGH (ref 70–99)

## 2012-02-25 NOTE — Progress Notes (Signed)
Pt with improving blood pressures.  Pt was contacted by Dr Alyson Ingles his family Md who increased his amlodipine $RemoveBefore'10mg'HEiWlAouFXFtr$  daily.  Dr. Claiborne Billings increased his metoprolol to $RemoveBefor'100mg'bxZoKfnQfQjK$  in the am and 75 continued in the evening.  Plan to have pt walk the track first station on Monday in hopes bp will remain lower than pre exercise bp and then proceed to equipment.  Pt will see Dr Chalmers Cater on Friday for follow up.

## 2012-02-28 ENCOUNTER — Encounter (HOSPITAL_COMMUNITY)
Admission: RE | Admit: 2012-02-28 | Discharge: 2012-02-28 | Disposition: A | Discharge status: Home / Self Care | Payer: BC Managed Care – PPO | Source: Ambulatory Visit | Attending: Cardiovascular Disease | Admitting: Cardiovascular Disease

## 2012-02-28 LAB — GLUCOSE, CAPILLARY
Glucose-Capillary: 64 mg/dL — ABNORMAL LOW (ref 70–99)
Glucose-Capillary: 73 mg/dL (ref 70–99)
Glucose-Capillary: 85 mg/dL (ref 70–99)

## 2012-02-28 NOTE — Progress Notes (Signed)
Joshua [name] 56 y.o. male Nutrition Note Spoke with pt. Pt nutrition plan reviewed. Wt loss tips, High Fiber Nutrition Therapy, and Diabetes discussed. Pt states he has been able to lose 40 lbs "several times" in his adult life by being more active. Pt c/o constipation is better now that pt restarted Reglan. Pt reports he has not been checking his CBG's at home because he is out of strips and "I didn't remember how expensive they were ($130-140 for 100 strips)." Pt states he typically checks his CBG's BID. Pre-exercise CBG 64 mg/dL this am after pt ate a small amount of squash casserole and took less insulin than normal. Pt has an appt with Dr. Chalmers Cater Ludwig Clarks 3/22. Per discussion with pt, pt and his wife are struggling with what pt can eat. Pt works at night and his wife works weekends. Pt encouraged to prepare food on his off days and freeze individual portions, which pt stated probably would not happen. Alternatives to prepared meals (e.g. Sandwiches) discussed and appear to be feasible for pt to prepare and take to work. Nutrition Diagnosis   Food-and nutrition-related knowledge deficit related to lack of exposure to information as related to diagnosis of: ? CVD ? DM (A1c 11.3)   Obesity related to excessive energy intake as evidenced by a BMI of 44.6  Nutrition RX/   Estimated Daily Nutrition Needs for: ? wt loss  2000-2500 Kcal , Total Fat 55-70gm, Saturated Fat 15-19 gm, Trans Fat 2.0-2.5 gm,  Sodium less than 1500 mg , Grams of CHO 250-325     Nutrition Intervention   Pt individual nutrition plan including cholesterol goals reviewed with pt.   Pt to attend the Portion Distortion class   Handouts given for strip and meter assistance, Nutrition I, Nutrition II, and Diabetes Blitz classes   Pt given a meter with 10 test strips to get pt to his appt with Dr. Chalmers Cater   Continue client-centered nutrition education by RD, as part of interdisciplinary care. Goal(s)   Pt to identify and limit food  sources of saturated fat, trans fat, and cholesterol   Pt to identify food quantities necessary to achieve: ? wt loss to a goal wt of 270-282 lb (122.7-128.2 kg) at graduation from cardiac rehab.    Pt to describe the benefit of including fruits, vegetables, whole grains, and low-fat dairy products in a heart healthy meal plan.   CBG concentrations in the normal range or as close to normal as is safely possible. Monitor and Evaluate progress toward nutrition goal with team.

## 2012-03-01 ENCOUNTER — Encounter (HOSPITAL_COMMUNITY)
Admission: RE | Admit: 2012-03-01 | Discharge: 2012-03-01 | Disposition: A | Discharge status: Home / Self Care | Payer: BC Managed Care – PPO | Source: Ambulatory Visit | Attending: Cardiovascular Disease | Admitting: Cardiovascular Disease

## 2012-03-01 LAB — GLUCOSE, CAPILLARY: Glucose-Capillary: 237 mg/dL — ABNORMAL HIGH (ref 70–99)

## 2012-03-03 ENCOUNTER — Encounter (HOSPITAL_COMMUNITY)
Admission: RE | Admit: 2012-03-03 | Discharge: 2012-03-03 | Disposition: A | Discharge status: Home / Self Care | Payer: BC Managed Care – PPO | Source: Ambulatory Visit | Attending: Cardiovascular Disease | Admitting: Cardiovascular Disease

## 2012-03-03 NOTE — Progress Notes (Signed)
Pt to see Dr. Chalmers Cater in the office at 8 a.m.  Pt given rehab report with BP and Blood sugar for review.

## 2012-03-06 ENCOUNTER — Encounter (HOSPITAL_COMMUNITY)
Admission: RE | Admit: 2012-03-06 | Discharge: 2012-03-06 | Disposition: A | Discharge status: Home / Self Care | Payer: BC Managed Care – PPO | Source: Ambulatory Visit | Attending: Cardiovascular Disease | Admitting: Cardiovascular Disease

## 2012-03-06 LAB — GLUCOSE, CAPILLARY: Glucose-Capillary: 167 mg/dL — ABNORMAL HIGH (ref 70–99)

## 2012-03-06 NOTE — Progress Notes (Signed)
Pt saw Dr. Chalmers Cater in the office on Friday.  Pt instructed to reduce his 70/30 to 10 units on mornings of exercise.  Pt did not get a chance to check his blood sugar at home, he fell asleep after he got home from work at 4 am.  Pt checked pre exercise 167.

## 2012-03-08 ENCOUNTER — Encounter (HOSPITAL_COMMUNITY)
Admission: RE | Admit: 2012-03-08 | Discharge: 2012-03-08 | Disposition: A | Discharge status: Home / Self Care | Payer: BC Managed Care – PPO | Source: Ambulatory Visit | Attending: Cardiovascular Disease | Admitting: Cardiovascular Disease

## 2012-03-08 LAB — GLUCOSE, CAPILLARY: Glucose-Capillary: 170 mg/dL — ABNORMAL HIGH (ref 70–99)

## 2012-03-10 ENCOUNTER — Encounter (HOSPITAL_COMMUNITY)
Admission: RE | Admit: 2012-03-10 | Discharge: 2012-03-10 | Disposition: A | Discharge status: Home / Self Care | Payer: BC Managed Care – PPO | Source: Ambulatory Visit | Attending: Cardiovascular Disease | Admitting: Cardiovascular Disease

## 2012-03-10 LAB — GLUCOSE, CAPILLARY: Glucose-Capillary: 252 mg/dL — ABNORMAL HIGH (ref 70–99)

## 2012-03-13 ENCOUNTER — Encounter (HOSPITAL_COMMUNITY)
Admission: RE | Admit: 2012-03-13 | Discharge: 2012-03-13 | Disposition: A | Discharge status: Home / Self Care | Payer: BC Managed Care – PPO | Source: Ambulatory Visit | Attending: Cardiovascular Disease | Admitting: Cardiovascular Disease

## 2012-03-13 DIAGNOSIS — E785 Hyperlipidemia, unspecified: Secondary | ICD-10-CM | POA: Insufficient documentation

## 2012-03-13 DIAGNOSIS — E119 Type 2 diabetes mellitus without complications: Secondary | ICD-10-CM | POA: Insufficient documentation

## 2012-03-13 DIAGNOSIS — Z9861 Coronary angioplasty status: Secondary | ICD-10-CM | POA: Insufficient documentation

## 2012-03-13 DIAGNOSIS — I10 Essential (primary) hypertension: Secondary | ICD-10-CM | POA: Insufficient documentation

## 2012-03-13 DIAGNOSIS — K219 Gastro-esophageal reflux disease without esophagitis: Secondary | ICD-10-CM | POA: Insufficient documentation

## 2012-03-13 DIAGNOSIS — I252 Old myocardial infarction: Secondary | ICD-10-CM | POA: Insufficient documentation

## 2012-03-13 DIAGNOSIS — Z5189 Encounter for other specified aftercare: Secondary | ICD-10-CM | POA: Insufficient documentation

## 2012-03-13 LAB — GLUCOSE, CAPILLARY: Glucose-Capillary: 163 mg/dL — ABNORMAL HIGH (ref 70–99)

## 2012-03-15 ENCOUNTER — Encounter (HOSPITAL_COMMUNITY)
Admission: RE | Admit: 2012-03-15 | Discharge: 2012-03-15 | Disposition: A | Discharge status: Home / Self Care | Payer: BC Managed Care – PPO | Source: Ambulatory Visit | Attending: Cardiovascular Disease | Admitting: Cardiovascular Disease

## 2012-03-15 LAB — GLUCOSE, CAPILLARY: Glucose-Capillary: 219 mg/dL — ABNORMAL HIGH (ref 70–99)

## 2012-03-17 ENCOUNTER — Encounter (HOSPITAL_COMMUNITY)
Admission: RE | Admit: 2012-03-17 | Discharge: 2012-03-17 | Disposition: A | Discharge status: Home / Self Care | Payer: BC Managed Care – PPO | Source: Ambulatory Visit | Attending: Cardiovascular Disease | Admitting: Cardiovascular Disease

## 2012-03-17 LAB — GLUCOSE, CAPILLARY
Glucose-Capillary: 300 mg/dL — ABNORMAL HIGH (ref 70–99)
Glucose-Capillary: 215 mg/dL — ABNORMAL HIGH (ref 70–99)

## 2012-03-20 ENCOUNTER — Encounter (HOSPITAL_COMMUNITY)
Admission: RE | Admit: 2012-03-20 | Discharge: 2012-03-20 | Disposition: A | Discharge status: Home / Self Care | Payer: BC Managed Care – PPO | Source: Ambulatory Visit | Attending: Cardiovascular Disease | Admitting: Cardiovascular Disease

## 2012-03-20 LAB — GLUCOSE, CAPILLARY
Glucose-Capillary: 93 mg/dL (ref 70–99)
Glucose-Capillary: 84 mg/dL (ref 70–99)

## 2012-03-20 NOTE — Progress Notes (Signed)
Pt pre exercise blood sugar checked 90.  Pt took 10 units of insulin as prescribed and ate peanut butter sandwich.  Pt did not check his blood sugar at home.  Pt has not "received" his meter and strips in the mail.  Pt has been waiting several weeks for the equipment to arrive.  Despite multiple urges to contact the company to determine the expected arrival date pt has not made any contact.  Today he mentioned that he had a message on his cell phone from the company but had not returned their call to determine what else was needed.  Pt is beginning to realize that it is important to check his blood sugar prior to insulin administration to determine how much or any is needed and what foods should be eaten.  Pt struggles with seeing the "big" picture and how things all work together for diabetes management.  Continue to educate and support. Pt post exercise blood sugar checked 84.  Pt given additional snack of tang and sweet tarts.  Pt advised that I would check his blood sugar in 15 minutes.  Pt left rehab prior to having his blood sugar checked and does not have anyway of checking his blood sugar when he returns home.  Pt called at home and reports no symptoms

## 2012-03-22 ENCOUNTER — Encounter (HOSPITAL_COMMUNITY)
Admission: RE | Admit: 2012-03-22 | Discharge: 2012-03-22 | Disposition: A | Discharge status: Home / Self Care | Payer: BC Managed Care – PPO | Source: Ambulatory Visit | Attending: Cardiovascular Disease | Admitting: Cardiovascular Disease

## 2012-03-22 LAB — GLUCOSE, CAPILLARY: Glucose-Capillary: 142 mg/dL — ABNORMAL HIGH (ref 70–99)

## 2012-03-24 ENCOUNTER — Telehealth (HOSPITAL_COMMUNITY): Payer: Self-pay | Admitting: *Deleted

## 2012-03-24 ENCOUNTER — Encounter (HOSPITAL_COMMUNITY): Payer: BC Managed Care – PPO

## 2012-03-24 NOTE — Telephone Encounter (Signed)
Pt left message that he was having car trouble and would not be able to attend exercise today.

## 2012-03-27 ENCOUNTER — Encounter (HOSPITAL_COMMUNITY)
Admission: RE | Admit: 2012-03-27 | Discharge: 2012-03-27 | Disposition: A | Discharge status: Home / Self Care | Payer: BC Managed Care – PPO | Source: Ambulatory Visit | Attending: Cardiovascular Disease | Admitting: Cardiovascular Disease

## 2012-03-27 LAB — GLUCOSE, CAPILLARY: Glucose-Capillary: 105 mg/dL — ABNORMAL HIGH (ref 70–99)

## 2012-03-27 NOTE — Progress Notes (Signed)
Pt to see Dr. Claiborne Billings in the office on tomorrow.  Rehab report faxed to Dr. Evette Georges office for review and assessment of blood pressure medication regimen.

## 2012-03-29 ENCOUNTER — Encounter (HOSPITAL_COMMUNITY)
Admission: RE | Admit: 2012-03-29 | Discharge: 2012-03-29 | Disposition: A | Discharge status: Home / Self Care | Payer: BC Managed Care – PPO | Source: Ambulatory Visit | Attending: Cardiovascular Disease | Admitting: Cardiovascular Disease

## 2012-03-29 LAB — GLUCOSE, CAPILLARY: Glucose-Capillary: 257 mg/dL — ABNORMAL HIGH (ref 70–99)

## 2012-03-31 ENCOUNTER — Encounter (HOSPITAL_COMMUNITY): Payer: BC Managed Care – PPO

## 2012-04-03 ENCOUNTER — Encounter (HOSPITAL_COMMUNITY)
Admission: RE | Admit: 2012-04-03 | Discharge: 2012-04-03 | Disposition: A | Discharge status: Home / Self Care | Payer: BC Managed Care – PPO | Source: Ambulatory Visit | Attending: Cardiovascular Disease | Admitting: Cardiovascular Disease

## 2012-04-03 LAB — GLUCOSE, CAPILLARY: Glucose-Capillary: 133 mg/dL — ABNORMAL HIGH (ref 70–99)

## 2012-04-05 ENCOUNTER — Encounter (HOSPITAL_COMMUNITY)
Admission: RE | Admit: 2012-04-05 | Discharge: 2012-04-05 | Disposition: A | Discharge status: Home / Self Care | Payer: BC Managed Care – PPO | Source: Ambulatory Visit | Attending: Cardiovascular Disease | Admitting: Cardiovascular Disease

## 2012-04-05 LAB — GLUCOSE, CAPILLARY: Glucose-Capillary: 213 mg/dL — ABNORMAL HIGH (ref 70–99)

## 2012-04-06 NOTE — Progress Notes (Signed)
Pt saw his primary MD in the office last week.  Pt started on new medication for his blood pressure. Pt is taking Azor daily.  Pt is unsure of the dosage and will bring it in on his next exercise session.  Information regarding the new medication given to pt per his request.

## 2012-04-07 ENCOUNTER — Encounter (HOSPITAL_COMMUNITY)
Admission: RE | Admit: 2012-04-07 | Discharge: 2012-04-07 | Disposition: A | Discharge status: Home / Self Care | Payer: BC Managed Care – PPO | Source: Ambulatory Visit | Attending: Cardiovascular Disease | Admitting: Cardiovascular Disease

## 2012-04-07 LAB — GLUCOSE, CAPILLARY
Glucose-Capillary: 97 mg/dL (ref 70–99)
Glucose-Capillary: 79 mg/dL (ref 70–99)

## 2012-04-10 ENCOUNTER — Encounter (HOSPITAL_COMMUNITY)
Admission: RE | Admit: 2012-04-10 | Discharge: 2012-04-10 | Disposition: A | Discharge status: Home / Self Care | Payer: BC Managed Care – PPO | Source: Ambulatory Visit | Attending: Cardiovascular Disease | Admitting: Cardiovascular Disease

## 2012-04-10 LAB — GLUCOSE, CAPILLARY: Glucose-Capillary: 236 mg/dL — ABNORMAL HIGH (ref 70–99)

## 2012-04-10 NOTE — Progress Notes (Signed)
Pt weight noted to be elevated from 129.8kg on 4/26 to 132.7kg on 4/29.  Pt reports that he feels tight in the abdominal area.  Pt does not have any complaints of shortness of breath or peripheral edema.  Plan to fax rehab report to pt's family MD - Dr. Mariea Clonts for review.

## 2012-04-12 ENCOUNTER — Encounter (HOSPITAL_COMMUNITY)
Admission: RE | Admit: 2012-04-12 | Discharge: 2012-04-12 | Disposition: A | Discharge status: Home / Self Care | Payer: BC Managed Care – PPO | Source: Ambulatory Visit | Attending: Cardiovascular Disease | Admitting: Cardiovascular Disease

## 2012-04-12 DIAGNOSIS — Z5189 Encounter for other specified aftercare: Secondary | ICD-10-CM | POA: Insufficient documentation

## 2012-04-12 DIAGNOSIS — Z9861 Coronary angioplasty status: Secondary | ICD-10-CM | POA: Insufficient documentation

## 2012-04-12 DIAGNOSIS — I10 Essential (primary) hypertension: Secondary | ICD-10-CM | POA: Insufficient documentation

## 2012-04-12 DIAGNOSIS — E785 Hyperlipidemia, unspecified: Secondary | ICD-10-CM | POA: Insufficient documentation

## 2012-04-12 DIAGNOSIS — K219 Gastro-esophageal reflux disease without esophagitis: Secondary | ICD-10-CM | POA: Insufficient documentation

## 2012-04-12 DIAGNOSIS — E119 Type 2 diabetes mellitus without complications: Secondary | ICD-10-CM | POA: Insufficient documentation

## 2012-04-12 DIAGNOSIS — I252 Old myocardial infarction: Secondary | ICD-10-CM | POA: Insufficient documentation

## 2012-04-12 LAB — GLUCOSE, CAPILLARY: Glucose-Capillary: 109 mg/dL — ABNORMAL HIGH (ref 70–99)

## 2012-04-14 ENCOUNTER — Encounter (HOSPITAL_COMMUNITY)
Admission: RE | Admit: 2012-04-14 | Discharge: 2012-04-14 | Disposition: A | Discharge status: Home / Self Care | Payer: BC Managed Care – PPO | Source: Ambulatory Visit | Attending: Cardiovascular Disease | Admitting: Cardiovascular Disease

## 2012-04-14 LAB — GLUCOSE, CAPILLARY: Glucose-Capillary: 237 mg/dL — ABNORMAL HIGH (ref 70–99)

## 2012-04-14 NOTE — Progress Notes (Signed)
Continue to check pt's blood sugar every exercise session.  Pt has prescription for his strips and plans to purchase them when he gets paid next week.

## 2012-04-17 ENCOUNTER — Encounter (HOSPITAL_COMMUNITY)
Admission: RE | Admit: 2012-04-17 | Discharge: 2012-04-17 | Disposition: A | Discharge status: Home / Self Care | Payer: BC Managed Care – PPO | Source: Ambulatory Visit | Attending: Cardiovascular Disease | Admitting: Cardiovascular Disease

## 2012-04-17 LAB — GLUCOSE, CAPILLARY: Glucose-Capillary: 138 mg/dL — ABNORMAL HIGH (ref 70–99)

## 2012-04-17 NOTE — Progress Notes (Signed)
Pt took 10 units of insulin as prescribed by Dr. Chalmers Cater on exercise days (this is a reduction of his usual amount).  Pt does not check his blood sugar because he does not have strips.  This morning he took his insulin but did not eat.  Pt blood sugar checked  138.  Pt given snack of peanut butter, crackers and tang. Pt instructed to eat after the second station.  Pt noted that he didn't eat his snack until after third station.  Will not check post blood sugar since he just finished eating his snack.  Pt counseled extensively about the management of his diabetes.

## 2012-04-19 ENCOUNTER — Encounter (HOSPITAL_COMMUNITY)
Admission: RE | Admit: 2012-04-19 | Discharge: 2012-04-19 | Disposition: A | Discharge status: Home / Self Care | Payer: BC Managed Care – PPO | Source: Ambulatory Visit | Attending: Cardiovascular Disease | Admitting: Cardiovascular Disease

## 2012-04-19 LAB — GLUCOSE, CAPILLARY: Glucose-Capillary: 117 mg/dL — ABNORMAL HIGH (ref 70–99)

## 2012-04-19 NOTE — Progress Notes (Signed)
Joshua [name] 56 y.o. male Nutrition Note Spoke with pt. Pt wt today reportedly 129.5 kg (284.9 lb). Pt with desired 9.7 lb wt loss over the past 2 months. Pt reports wt loss due to changes he has made in his diet. Per pt, he is "eating ground [place] all the time." Pt eats 1 meal and several snacks daily. Pt eats out rarely. Chooses low-fat food frequently. Per discussion with pt, pt has eaten the equivalent of 1 hot dog/week over the past 2 months. Pt chooses heart healthy fats most of the time. Pt continues to use regular salad dressing on his salad once a week.  Pt uses skim milk and eats regular cheese.  Avoids some salt foods.  Pt chooses frozen vegetables "when I eat them." Pt states that with his and his wife's schedule they rarely eat vegetables.Pt uses condiments that are high in sodium (e.g. Mustard, Worcestershire sauce, barbeque sauce, steak sauce). Pt is trying to purchase the lower-sodium condiments when available. Pt is consuming at least half the grains consumed as whole-grains.  No lipid panel available to discuss. Goals for lipids previously reviewed.  There continue to be areas in pt diet that can be improved for pt's heart health. Pt is diabetic. Per pt, his last A1c was "8," which is a signigicant improvement from his admission A1c of 11.3. Pt does not check his CBG's at home because "they cost money and I have my hospital bills coming in right now and 3 teenagers at home." This writer asked pt if he had discussed this issue with Dr. Chalmers Cater, which pt reports he has not. Pre-exercise CBG's have been 78 - 299 mg/dL with 12 out of 27 readings WNL. Pt works at night and has a tendency to not eat before coming to exercise. Post-exercise CBG's have been 85 - 215 mg/dL.  Pt unable to recall exact recommended ranges for CBG's before meals and 1-2 hours after meals stating "less than 150 mg/dL for both answers."  Recommended CBG ranges reviewed with pt.  Pt able to recall hypoglycemia treatment  protocol.  Treatment of hypoglycemia reviewed. Pt expressed understanding. Pt reports he has previously received diabetes education.   Nutrition Diagnosis   Food-and nutrition-related knowledge deficit related to lack of exposure to information as related to diagnosis of: ? CVD ? DM (A1c 11.3)   Obesity related to excessive energy intake as evidenced by a BMI of 44.6 Nutrition RX/   Estimated Daily Nutrition Needs for: ? wt loss  2000-2500 Kcal , Total Fat 55-70gm, Saturated Fat 15-19 gm, Trans Fat 2.0-2.5 gm,  Sodium less than 1500 mg , Grams of CHO 250-325     Nutrition Intervention   Pt individual nutrition plan reviewed with pt.   Pt to attend the Portion Distortion class   Continue client-centered nutrition education by RD, as part of interdisciplinary care. Goal(s)   Pt to identify and limit food sources of saturated fat, trans fat, and cholesterol - partially met   Pt to identify food quantities necessary to achieve: ? wt loss to a goal wt of 270-282 lb (122.7-128.2 kg) at graduation from cardiac rehab. - pt on track to meet his wt loss goal   Pt to describe the benefit of including fruits, vegetables, whole grains, and low-fat dairy products in a heart healthy meal plan.- pt understands the benefit and does not always apply knowledge to his dietary habits.   CBG concentrations in the normal range or as close to normal as is  safely possible. - partially met; CBG's improving. Monitor and Evaluate progress toward nutrition goal with team.

## 2012-04-21 ENCOUNTER — Encounter (HOSPITAL_COMMUNITY)
Admission: RE | Admit: 2012-04-21 | Discharge: 2012-04-21 | Disposition: A | Discharge status: Home / Self Care | Payer: BC Managed Care – PPO | Source: Ambulatory Visit | Attending: Cardiovascular Disease | Admitting: Cardiovascular Disease

## 2012-04-21 LAB — GLUCOSE, CAPILLARY: Glucose-Capillary: 149 mg/dL — ABNORMAL HIGH (ref 70–99)

## 2012-04-24 ENCOUNTER — Encounter (HOSPITAL_COMMUNITY)
Admission: RE | Admit: 2012-04-24 | Discharge: 2012-04-24 | Disposition: A | Discharge status: Home / Self Care | Payer: BC Managed Care – PPO | Source: Ambulatory Visit | Attending: Cardiovascular Disease | Admitting: Cardiovascular Disease

## 2012-04-24 LAB — GLUCOSE, CAPILLARY: Glucose-Capillary: 161 mg/dL — ABNORMAL HIGH (ref 70–99)

## 2012-04-26 ENCOUNTER — Encounter (HOSPITAL_COMMUNITY)
Admission: RE | Admit: 2012-04-26 | Discharge: 2012-04-26 | Disposition: A | Discharge status: Home / Self Care | Payer: BC Managed Care – PPO | Source: Ambulatory Visit | Attending: Cardiovascular Disease | Admitting: Cardiovascular Disease

## 2012-04-26 LAB — GLUCOSE, CAPILLARY
Glucose-Capillary: 295 mg/dL — ABNORMAL HIGH (ref 70–99)
Glucose-Capillary: 206 mg/dL — ABNORMAL HIGH (ref 70–99)

## 2012-04-28 ENCOUNTER — Encounter (HOSPITAL_COMMUNITY)
Admission: RE | Admit: 2012-04-28 | Discharge: 2012-04-28 | Disposition: A | Discharge status: Home / Self Care | Payer: BC Managed Care – PPO | Source: Ambulatory Visit | Attending: Cardiovascular Disease | Admitting: Cardiovascular Disease

## 2012-04-28 LAB — GLUCOSE, CAPILLARY: Glucose-Capillary: 321 mg/dL — ABNORMAL HIGH (ref 70–99)

## 2012-05-01 ENCOUNTER — Encounter (HOSPITAL_COMMUNITY)
Admission: RE | Admit: 2012-05-01 | Discharge: 2012-05-01 | Disposition: A | Discharge status: Home / Self Care | Payer: BC Managed Care – PPO | Source: Ambulatory Visit | Attending: Cardiovascular Disease | Admitting: Cardiovascular Disease

## 2012-05-01 LAB — GLUCOSE, CAPILLARY: Glucose-Capillary: 248 mg/dL — ABNORMAL HIGH (ref 70–99)

## 2012-05-03 ENCOUNTER — Encounter (HOSPITAL_COMMUNITY)
Admission: RE | Admit: 2012-05-03 | Discharge: 2012-05-03 | Disposition: A | Discharge status: Home / Self Care | Payer: BC Managed Care – PPO | Source: Ambulatory Visit | Attending: Cardiovascular Disease | Admitting: Cardiovascular Disease

## 2012-05-03 LAB — GLUCOSE, CAPILLARY: Glucose-Capillary: 333 mg/dL — ABNORMAL HIGH (ref 70–99)

## 2012-05-03 NOTE — Progress Notes (Signed)
Joshua [name] 56 y.o. male Nutrition Consult Consult for hyperglycemia. Pre-exercise CBG 382 mg/dL. Per discussion with pt, pt took 15 units of insulin and ate barbeque chicken "equivalent to a chicken thigh" and drank a diet [facility]. Pt states he did not take his insulin before going to work and eating a foot long chicken sub with mustard, mayo, lettuce and tomato from [employer]. Pt has not checked his CBG's at home since starting this program due to "hospital bills." Pt encouraged to discuss hospital bills with the billing department and set-up a payment plan that he can manage better. Pt with frequent, varing reasons for not obtaining his test strips. Pt asked re: inconsistency in reasons for not obtaining test strips. Pt stated, "last time I said the pharmacy had the prescription and I hadn't picked it up. Now, I have the written prescription and I can take it to get it filled." Per discussion with Maurice Small, RN, there was a ? Re: pt's food security. Pt asked whether he had enough food. Pt stated, "I have plenty of food." Nutrition Diagnosis   Food-and nutrition-related knowledge deficit related to lack of exposure to information as related to diagnosis of: ? CVD ? DM (A1c 11.3)   Obesity related to excessive energy intake as evidenced by a BMI of 44.6 Nutrition RX/   Estimated Daily Nutrition Needs for: ? wt loss  2000-2500 Kcal , Total Fat 55-70gm, Saturated Fat 15-19 gm, Trans Fat 2.0-2.5 gm,  Sodium less than 1500 mg , Grams of CHO 250-325     Nutrition Intervention   Continued discussion re: SMBG   Pt to attend the Portion Distortion class   Continue client-centered nutrition education by RD, as part of interdisciplinary care. Goal(s)   Pt to identify and limit food sources of saturated fat, trans fat, and cholesterol - partially met   Pt to identify food quantities necessary to achieve: ? wt loss to a goal wt of 270-282 lb (122.7-128.2 kg) at graduation from cardiac rehab. - pt  on track to meet his wt loss goal   Pt to describe the benefit of including fruits, vegetables, whole grains, and low-fat dairy products in a heart healthy meal plan.- pt understands the benefit and does not always apply knowledge to his dietary habits.   CBG concentrations in the normal range or as close to normal as is safely possible. - not met Monitor and Evaluate progress toward nutrition goal with team.

## 2012-05-05 ENCOUNTER — Encounter (HOSPITAL_COMMUNITY): Payer: BC Managed Care – PPO

## 2012-05-08 ENCOUNTER — Encounter (HOSPITAL_COMMUNITY): Payer: BC Managed Care – PPO

## 2012-05-10 ENCOUNTER — Encounter (HOSPITAL_COMMUNITY)
Admission: RE | Admit: 2012-05-10 | Discharge: 2012-05-10 | Disposition: A | Discharge status: Home / Self Care | Payer: BC Managed Care – PPO | Source: Ambulatory Visit | Attending: Cardiovascular Disease | Admitting: Cardiovascular Disease

## 2012-05-10 LAB — GLUCOSE, CAPILLARY: Glucose-Capillary: 135 mg/dL — ABNORMAL HIGH (ref 70–99)

## 2012-05-12 ENCOUNTER — Encounter (HOSPITAL_COMMUNITY)
Admission: RE | Admit: 2012-05-12 | Discharge: 2012-05-12 | Disposition: A | Discharge status: Home / Self Care | Payer: BC Managed Care – PPO | Source: Ambulatory Visit | Attending: Cardiovascular Disease | Admitting: Cardiovascular Disease

## 2012-05-12 NOTE — Progress Notes (Signed)
Brief Nutrition Note Spoke with Maurice Small, RN and pt. Pt reportedly checked his CBG's before he ate and after he ate this morning before coming to exercise. Per RN, pt excited to report he had checked his CBG's. Per discussion with pt, pt states he got a new glucometer that his insurance will pay for part of the strips. Pt reports he paid $7 for and unknown quantity of strips. Pt encouraged by this Probation officer and RN to continue checking CBG's and working toward SMBG. Continue client-centered nutrition education by RD as part of interdisciplinary care.  Monitor and evaluate progress toward nutrition goal with team.

## 2012-05-15 ENCOUNTER — Encounter (HOSPITAL_COMMUNITY)
Admission: RE | Admit: 2012-05-15 | Discharge: 2012-05-15 | Disposition: A | Discharge status: Home / Self Care | Payer: BC Managed Care – PPO | Source: Ambulatory Visit | Attending: Cardiovascular Disease | Admitting: Cardiovascular Disease

## 2012-05-15 DIAGNOSIS — E785 Hyperlipidemia, unspecified: Secondary | ICD-10-CM | POA: Insufficient documentation

## 2012-05-15 DIAGNOSIS — Z5189 Encounter for other specified aftercare: Secondary | ICD-10-CM | POA: Insufficient documentation

## 2012-05-15 DIAGNOSIS — I252 Old myocardial infarction: Secondary | ICD-10-CM | POA: Insufficient documentation

## 2012-05-15 DIAGNOSIS — I10 Essential (primary) hypertension: Secondary | ICD-10-CM | POA: Insufficient documentation

## 2012-05-15 DIAGNOSIS — Z9861 Coronary angioplasty status: Secondary | ICD-10-CM | POA: Insufficient documentation

## 2012-05-15 DIAGNOSIS — E119 Type 2 diabetes mellitus without complications: Secondary | ICD-10-CM | POA: Insufficient documentation

## 2012-05-15 DIAGNOSIS — K219 Gastro-esophageal reflux disease without esophagitis: Secondary | ICD-10-CM | POA: Insufficient documentation

## 2012-05-17 ENCOUNTER — Encounter (HOSPITAL_COMMUNITY)
Admission: RE | Admit: 2012-05-17 | Discharge: 2012-05-17 | Disposition: A | Discharge status: Home / Self Care | Payer: BC Managed Care – PPO | Source: Ambulatory Visit | Attending: Cardiovascular Disease | Admitting: Cardiovascular Disease

## 2012-05-19 ENCOUNTER — Encounter (HOSPITAL_COMMUNITY)
Admission: RE | Admit: 2012-05-19 | Discharge: 2012-05-19 | Disposition: A | Discharge status: Home / Self Care | Payer: BC Managed Care – PPO | Source: Ambulatory Visit | Attending: Cardiovascular Disease | Admitting: Cardiovascular Disease

## 2012-05-19 ENCOUNTER — Encounter (HOSPITAL_COMMUNITY): Payer: BC Managed Care – PPO

## 2012-05-19 NOTE — Progress Notes (Signed)
Pt graduates today.  Completing 36/36 exercise sessions.  Pt plans to continue home exercise with walking.  Based upon his progress toward meeting his goals, I anticipate he will continue to do well with continued encouragement and support from his medical providers.

## 2012-05-22 ENCOUNTER — Encounter (HOSPITAL_COMMUNITY): Payer: BC Managed Care – PPO

## 2012-05-24 ENCOUNTER — Encounter (HOSPITAL_COMMUNITY): Payer: BC Managed Care – PPO

## 2012-05-26 ENCOUNTER — Encounter (HOSPITAL_COMMUNITY): Payer: BC Managed Care – PPO

## 2012-05-29 ENCOUNTER — Encounter (HOSPITAL_COMMUNITY): Payer: BC Managed Care – PPO

## 2012-05-31 ENCOUNTER — Encounter (HOSPITAL_COMMUNITY): Payer: BC Managed Care – PPO

## 2012-06-02 ENCOUNTER — Encounter (HOSPITAL_COMMUNITY): Payer: BC Managed Care – PPO

## 2012-06-05 ENCOUNTER — Encounter (HOSPITAL_COMMUNITY): Payer: BC Managed Care – PPO

## 2012-06-07 ENCOUNTER — Encounter (HOSPITAL_COMMUNITY): Payer: BC Managed Care – PPO

## 2012-06-09 ENCOUNTER — Encounter (HOSPITAL_COMMUNITY): Payer: BC Managed Care – PPO

## 2012-10-09 ENCOUNTER — Encounter (HOSPITAL_BASED_OUTPATIENT_CLINIC_OR_DEPARTMENT_OTHER): Payer: BC Managed Care – PPO | Attending: General Surgery

## 2012-10-09 DIAGNOSIS — Z79899 Other long term (current) drug therapy: Secondary | ICD-10-CM | POA: Insufficient documentation

## 2012-10-09 DIAGNOSIS — E669 Obesity, unspecified: Secondary | ICD-10-CM | POA: Insufficient documentation

## 2012-10-09 DIAGNOSIS — X58XXXA Exposure to other specified factors, initial encounter: Secondary | ICD-10-CM | POA: Insufficient documentation

## 2012-10-09 DIAGNOSIS — I2510 Atherosclerotic heart disease of native coronary artery without angina pectoris: Secondary | ICD-10-CM | POA: Insufficient documentation

## 2012-10-09 DIAGNOSIS — I10 Essential (primary) hypertension: Secondary | ICD-10-CM | POA: Insufficient documentation

## 2012-10-09 DIAGNOSIS — E119 Type 2 diabetes mellitus without complications: Secondary | ICD-10-CM | POA: Insufficient documentation

## 2012-10-09 DIAGNOSIS — S90129A Contusion of unspecified lesser toe(s) without damage to nail, initial encounter: Secondary | ICD-10-CM | POA: Insufficient documentation

## 2012-10-09 DIAGNOSIS — Z794 Long term (current) use of insulin: Secondary | ICD-10-CM | POA: Insufficient documentation

## 2012-10-09 LAB — GLUCOSE, CAPILLARY: Glucose-Capillary: 396 mg/dL — ABNORMAL HIGH (ref 70–99)

## 2012-10-09 NOTE — Progress Notes (Signed)
[facility]  NAME:  Joshua [name], Joshua [name] NO.:  [id number]  MEDICAL RECORD NO.:  [id number]      DATE OF BIRTH:  1956-06-13  PHYSICIAN:  Judene Companion, M.D.           VISIT DATE:                                  OFFICE VISIT   This is a 56 year old obese diabetic who was sent here by his doctor because he had a discoloration under the nail of his left second toe. This patient has had diabetes for many years and is not really very good control.  His blood sugar was over 300 while he was here.  He is also hypertensive.  His blood pressure was 160/100.  His medicines include hydrochlorothiazide, Crestor, nitroglycerin, metoprolol, metoclopramide, Humulin insulin, spironolactone, Brilinta which is an anticoagulant, aspirin and metformin.  This gentleman weighs almost 300 pounds.  He was advised to control his sugars better and he says that he is in the process of losing weight.  The nail on examination is really just a subungual hematoma.  I do not feel any pulses on this gentleman, but he has been well worked up with his vascular status and he also states that he has had some sort of a myocardial infarction and he has had stents put in two of his coronary vessels.  He is going to follow up with his doctor.  His feet are plenty warm and I feel good popliteal pulses, but I can only hear them on the Doppler, but I do not think this is much of a problem.  So this man's diagnosis is a subungual hematoma of his left 2nd toe, type 2 diabetes insulin dependent, obesity, hypertension, and coronary artery disease.     Judene Companion, M.D.     PP/MEDQ  D:  10/09/2012  T:  10/09/2012  Job:  [id number]

## 2012-11-06 ENCOUNTER — Encounter (HOSPITAL_BASED_OUTPATIENT_CLINIC_OR_DEPARTMENT_OTHER): Payer: BC Managed Care – PPO | Attending: General Surgery

## 2013-06-29 ENCOUNTER — Other Ambulatory Visit: Payer: Self-pay | Admitting: Cardiovascular Disease

## 2013-07-06 ENCOUNTER — Telehealth: Payer: Self-pay | Admitting: Cardiovascular Disease

## 2013-07-06 NOTE — Telephone Encounter (Signed)
[name] needs to have his Crestor refilled and wants to kinow if we have any discount cards.

## 2013-07-07 ENCOUNTER — Other Ambulatory Visit: Payer: Self-pay | Admitting: Cardiovascular Disease

## 2013-07-09 NOTE — Telephone Encounter (Signed)
Rx was sent to pharmacy electronically. 

## 2013-07-10 NOTE — Telephone Encounter (Signed)
Returned call and pt informed card left at front desk.  Pt verbalized understanding and agreed w/ plan.  Card# [id number].

## 2013-08-09 ENCOUNTER — Other Ambulatory Visit: Payer: Self-pay | Admitting: Cardiovascular Disease

## 2013-08-09 NOTE — Telephone Encounter (Signed)
Rx was sent to pharmacy electronically. 

## 2013-10-04 ENCOUNTER — Ambulatory Visit: Payer: BC Managed Care – PPO | Admitting: Cardiovascular Disease

## 2013-10-09 ENCOUNTER — Ambulatory Visit (INDEPENDENT_AMBULATORY_CARE_PROVIDER_SITE_OTHER): Payer: BC Managed Care – PPO | Admitting: Cardiovascular Disease

## 2013-10-09 ENCOUNTER — Encounter: Payer: Self-pay | Admitting: Cardiovascular Disease

## 2013-10-09 VITALS — BP 156/88 | HR 90 | Ht 69.0 in | Wt 282.8 lb

## 2013-10-09 DIAGNOSIS — I2510 Atherosclerotic heart disease of native coronary artery without angina pectoris: Secondary | ICD-10-CM

## 2013-10-09 DIAGNOSIS — E1165 Type 2 diabetes mellitus with hyperglycemia: Secondary | ICD-10-CM

## 2013-10-09 DIAGNOSIS — R5381 Other malaise: Secondary | ICD-10-CM

## 2013-10-09 DIAGNOSIS — IMO0002 Reserved for concepts with insufficient information to code with codable children: Secondary | ICD-10-CM

## 2013-10-09 DIAGNOSIS — G909 Disorder of the autonomic nervous system, unspecified: Secondary | ICD-10-CM

## 2013-10-09 DIAGNOSIS — E119 Type 2 diabetes mellitus without complications: Secondary | ICD-10-CM

## 2013-10-09 DIAGNOSIS — E1143 Type 2 diabetes mellitus with diabetic autonomic (poly)neuropathy: Secondary | ICD-10-CM

## 2013-10-09 DIAGNOSIS — E1149 Type 2 diabetes mellitus with other diabetic neurological complication: Secondary | ICD-10-CM

## 2013-10-09 DIAGNOSIS — I213 ST elevation (STEMI) myocardial infarction of unspecified site: Secondary | ICD-10-CM

## 2013-10-09 DIAGNOSIS — I10 Essential (primary) hypertension: Secondary | ICD-10-CM

## 2013-10-09 DIAGNOSIS — E785 Hyperlipidemia, unspecified: Secondary | ICD-10-CM

## 2013-10-09 DIAGNOSIS — I219 Acute myocardial infarction, unspecified: Secondary | ICD-10-CM

## 2013-10-09 DIAGNOSIS — K219 Gastro-esophageal reflux disease without esophagitis: Secondary | ICD-10-CM

## 2013-10-09 LAB — COMPREHENSIVE METABOLIC PANEL WITH GFR
ALT: 10 U/L (ref 0–53)
AST: 11 U/L (ref 0–37)
Albumin: 4.3 g/dL (ref 3.5–5.2)
Alkaline Phosphatase: 92 U/L (ref 39–117)
BUN: 28 mg/dL — ABNORMAL HIGH (ref 6–23)
CO2: 24 meq/L (ref 19–32)
Calcium: 9.5 mg/dL (ref 8.4–10.5)
Chloride: 98 meq/L (ref 96–112)
Creat: 1.36 mg/dL — ABNORMAL HIGH (ref 0.50–1.35)
Glucose, Bld: 538 mg/dL (ref 70–99)
Potassium: 4.5 meq/L (ref 3.5–5.3)
Sodium: 132 meq/L — ABNORMAL LOW (ref 135–145)
Total Bilirubin: 0.3 mg/dL (ref 0.3–1.2)
Total Protein: 7.3 g/dL (ref 6.0–8.3)

## 2013-10-09 LAB — HEMOGLOBIN A1C
Hgb A1c MFr Bld: 16.2 % — ABNORMAL HIGH (ref <5.7)
Mean Plasma Glucose: 418 mg/dL — ABNORMAL HIGH (ref <117)

## 2013-10-09 LAB — CBC
HCT: 40.4 % (ref 39.0–52.0)
Hemoglobin: 13.3 g/dL (ref 13.0–17.0)
MCH: 28.1 pg (ref 26.0–34.0)
MCHC: 32.9 g/dL (ref 30.0–36.0)
MCV: 85.2 fL (ref 78.0–100.0)
Platelets: 204 10*3/uL (ref 150–400)
RBC: 4.74 MIL/uL (ref 4.22–5.81)
RDW: 14.2 % (ref 11.5–15.5)
WBC: 6 10*3/uL (ref 4.0–10.5)

## 2013-10-09 LAB — TSH: TSH: 2.229 u[IU]/mL (ref 0.350–4.500)

## 2013-10-09 LAB — LIPID PANEL
Cholesterol: 321 mg/dL — ABNORMAL HIGH (ref 0–200)
HDL: 68 mg/dL (ref >39)
LDL Cholesterol: 193 mg/dL — ABNORMAL HIGH (ref 0–99)
Total CHOL/HDL Ratio: 4.7 Ratio
Triglycerides: 302 mg/dL — ABNORMAL HIGH (ref <150)
VLDL: 60 mg/dL — ABNORMAL HIGH (ref 0–40)

## 2013-10-09 MED ORDER — METOPROLOL SUCCINATE ER 100 MG PO TB24: 100.0000 mg | ORAL_TABLET | Freq: Every day | ORAL | Status: DC

## 2013-10-09 NOTE — Patient Instructions (Addendum)
Your physician recommends that you schedule a follow-up appointment in: 6 MONTHS.  Your physician has recommended you make the following change in your medication: [place]. START THE NEW METOPROLOL PRESCRIPTION FOR THE $Remov'100MG'euUwmQ$ . THIS HAS ALREADY BEEN SENT TO THE PHARMACY. Your physician recommends that you return for lab work in: fasting.

## 2013-10-10 ENCOUNTER — Telehealth: Payer: Self-pay | Admitting: *Deleted

## 2013-10-10 ENCOUNTER — Telehealth: Payer: Self-pay | Admitting: Cardiovascular Disease

## 2013-10-10 ENCOUNTER — Other Ambulatory Visit: Payer: Self-pay | Admitting: *Deleted

## 2013-10-10 NOTE — Telephone Encounter (Signed)
Dr. Claiborne Billings returned a call to me with orders for patient concerning his elevated blood sugar. Patient was instructed V.O. Per Dr. Claiborne Billings to increase his metformin to $RemoveBefo'1000mg'qRnISrsPRGl$  twice daily. Take his NPH 70/30 insulin 40 units twice daily as previously ordered. (patient stated to me in earlier call that he doesn't use this daily.) he needs to schedule to see his PCP or endocrinologist tomorrow or the next day for evaluation. Patient voiced his understanding and states that he will call them to make appointment.

## 2013-10-10 NOTE — Telephone Encounter (Signed)
Dr. Claiborne Billings responded and notified.  Informed RN will call pt to see if he was fasting when labs completed.  Call to pt and left message to call back when message received as abnormal lab results received.  Need to know if fasting.

## 2013-10-10 NOTE — Telephone Encounter (Signed)
No response from pt.  Will forward to [place] to f/u with pt.

## 2013-10-10 NOTE — Telephone Encounter (Signed)
Critical High Glucose 538 result received from [facility].    Dr. Claiborne Billings paged.

## 2013-10-10 NOTE — Telephone Encounter (Signed)
Patient returned call to me. Informed him of the elevated blood sugar result and asked him if this was a fasting specimen. He informs me that it was and that he hasn't seen a MD in close to 1 year for follow up of his diabetes. I will notify Dr. Claiborne Billings of this and call him back with instructions.

## 2013-10-10 NOTE — Telephone Encounter (Signed)
Attempted to contact multiple times at all numbers in the patient's chart. No answers at all of them . Will attempt to call at a later time.

## 2013-10-19 ENCOUNTER — Other Ambulatory Visit: Payer: Self-pay | Admitting: Cardiology

## 2013-10-19 MED ORDER — NITROGLYCERIN 0.4 MG SL SUBL: 0.4000 mg | SUBLINGUAL_TABLET | SUBLINGUAL | Status: DC | PRN

## 2013-10-19 NOTE — Telephone Encounter (Signed)
Refilled patient's nitrostat SL per pharmacy request.

## 2013-10-28 ENCOUNTER — Encounter (HOSPITAL_COMMUNITY)
Admission: EM | Disposition: A | Discharge status: Home / Self Care | Payer: Self-pay | Source: Home / Self Care | Attending: Internal Medicine

## 2013-10-28 ENCOUNTER — Emergency Department (HOSPITAL_COMMUNITY): Payer: Worker's Compensation

## 2013-10-28 ENCOUNTER — Encounter (HOSPITAL_COMMUNITY): Payer: Self-pay | Admitting: Emergency Medicine

## 2013-10-28 ENCOUNTER — Inpatient Hospital Stay (HOSPITAL_COMMUNITY)
Admission: EM | Admit: 2013-10-28 | Discharge: 2013-11-02 | DRG: 580 | Disposition: A | Discharge status: Home / Self Care | Payer: Worker's Compensation | Attending: Internal Medicine | Admitting: Internal Medicine

## 2013-10-28 DIAGNOSIS — L02511 Cutaneous abscess of right hand: Secondary | ICD-10-CM

## 2013-10-28 DIAGNOSIS — R739 Hyperglycemia, unspecified: Secondary | ICD-10-CM

## 2013-10-28 DIAGNOSIS — K219 Gastro-esophageal reflux disease without esophagitis: Secondary | ICD-10-CM

## 2013-10-28 DIAGNOSIS — IMO0002 Reserved for concepts with insufficient information to code with codable children: Secondary | ICD-10-CM

## 2013-10-28 DIAGNOSIS — L02519 Cutaneous abscess of unspecified hand: Principal | ICD-10-CM | POA: Diagnosis present

## 2013-10-28 DIAGNOSIS — E1165 Type 2 diabetes mellitus with hyperglycemia: Secondary | ICD-10-CM

## 2013-10-28 DIAGNOSIS — I213 ST elevation (STEMI) myocardial infarction of unspecified site: Secondary | ICD-10-CM

## 2013-10-28 DIAGNOSIS — Z23 Encounter for immunization: Secondary | ICD-10-CM

## 2013-10-28 DIAGNOSIS — G909 Disorder of the autonomic nervous system, unspecified: Secondary | ICD-10-CM | POA: Diagnosis present

## 2013-10-28 DIAGNOSIS — M86179 Other acute osteomyelitis, unspecified ankle and foot: Secondary | ICD-10-CM

## 2013-10-28 DIAGNOSIS — Z955 Presence of coronary angioplasty implant and graft: Secondary | ICD-10-CM

## 2013-10-28 DIAGNOSIS — Z794 Long term (current) use of insulin: Secondary | ICD-10-CM

## 2013-10-28 DIAGNOSIS — Z7902 Long term (current) use of antithrombotics/antiplatelets: Secondary | ICD-10-CM

## 2013-10-28 DIAGNOSIS — Z79899 Other long term (current) drug therapy: Secondary | ICD-10-CM

## 2013-10-28 DIAGNOSIS — T50995A Adverse effect of other drugs, medicaments and biological substances, initial encounter: Secondary | ICD-10-CM | POA: Diagnosis present

## 2013-10-28 DIAGNOSIS — F1020 Alcohol dependence, uncomplicated: Secondary | ICD-10-CM | POA: Diagnosis present

## 2013-10-28 DIAGNOSIS — G4733 Obstructive sleep apnea (adult) (pediatric): Secondary | ICD-10-CM | POA: Diagnosis present

## 2013-10-28 DIAGNOSIS — E1143 Type 2 diabetes mellitus with diabetic autonomic (poly)neuropathy: Secondary | ICD-10-CM

## 2013-10-28 DIAGNOSIS — L03019 Cellulitis of unspecified finger: Principal | ICD-10-CM | POA: Diagnosis present

## 2013-10-28 DIAGNOSIS — K5909 Other constipation: Secondary | ICD-10-CM | POA: Diagnosis not present

## 2013-10-28 DIAGNOSIS — E785 Hyperlipidemia, unspecified: Secondary | ICD-10-CM

## 2013-10-28 DIAGNOSIS — I252 Old myocardial infarction: Secondary | ICD-10-CM

## 2013-10-28 DIAGNOSIS — Z6841 Body Mass Index (BMI) 40.0 and over, adult: Secondary | ICD-10-CM

## 2013-10-28 DIAGNOSIS — B961 Klebsiella pneumoniae [K. pneumoniae] as the cause of diseases classified elsewhere: Secondary | ICD-10-CM | POA: Diagnosis present

## 2013-10-28 DIAGNOSIS — I2510 Atherosclerotic heart disease of native coronary artery without angina pectoris: Secondary | ICD-10-CM | POA: Diagnosis present

## 2013-10-28 DIAGNOSIS — I10 Essential (primary) hypertension: Secondary | ICD-10-CM

## 2013-10-28 DIAGNOSIS — E119 Type 2 diabetes mellitus without complications: Secondary | ICD-10-CM

## 2013-10-28 DIAGNOSIS — E1149 Type 2 diabetes mellitus with other diabetic neurological complication: Secondary | ICD-10-CM | POA: Diagnosis present

## 2013-10-28 DIAGNOSIS — K3184 Gastroparesis: Secondary | ICD-10-CM | POA: Diagnosis present

## 2013-10-28 HISTORY — DX: Type 2 diabetes mellitus with diabetic polyneuropathy: E11.42

## 2013-10-28 HISTORY — PX: INCISION AND DRAINAGE: SHX5863

## 2013-10-28 HISTORY — DX: Type 2 diabetes mellitus with diabetic autonomic (poly)neuropathy: E11.43

## 2013-10-28 HISTORY — DX: Gastro-esophageal reflux disease without esophagitis: K21.9

## 2013-10-28 HISTORY — DX: Gastroparesis: K31.84

## 2013-10-28 SURGERY — INCISION AND DRAINAGE
Anesthesia: General | Laterality: Right | Wound class: Dirty or Infected

## 2013-10-28 SURGICAL SUPPLY — 14 items
BANDAGE GAUZE ELAST BULKY 4 IN (GAUZE/BANDAGES/DRESSINGS) ×1 IMPLANT
BNDG COHESIVE 4X5 TAN STRL (GAUZE/BANDAGES/DRESSINGS) ×1 IMPLANT
CORDS BIPOLAR (ELECTRODE) ×1 IMPLANT
GAUZE XEROFORM 1X8 LF (GAUZE/BANDAGES/DRESSINGS) ×1 IMPLANT
GLOVE BIOGEL M 8.0 STRL (GLOVE) ×1 IMPLANT
GLOVE SURG SS PI 8.5 STRL IVOR (GLOVE) ×2
GLOVE SURG SS PI 8.5 STRL STRW (GLOVE) IMPLANT
GOWN PREVENTION PLUS XXLARGE (GOWN DISPOSABLE) ×1 IMPLANT
GOWN STRL REIN XL XLG (GOWN DISPOSABLE) ×1 IMPLANT
KIT BASIN OR (CUSTOM PROCEDURE TRAY) ×1 IMPLANT
PACK LOWER EXTREMITY WL (CUSTOM PROCEDURE TRAY) ×1 IMPLANT
SET CYSTO W/LG BORE CLAMP LF (SET/KITS/TRAYS/PACK) ×1 IMPLANT
SPONGE GAUZE 4X4 12PLY (GAUZE/BANDAGES/DRESSINGS) ×1 IMPLANT
TOWEL OR 17X26 10 PK STRL BLUE (TOWEL DISPOSABLE) ×1 IMPLANT

## 2013-10-28 NOTE — Consult Note (Signed)
Reason for Consult:right small finger swelling Referring Physician: ER  Joshua [name] is an 57 y.o. male.  HPI: as above s/p laceration to volar aspect of right small finger several days ago with worsening pain and swelling despite po abx  Past Medical History  Diagnosis Date  . Diabetes mellitus   . Hypertension   . Hyperlipemia     Past Surgical History  Procedure Laterality Date  . Breast lumpectomy      History reviewed. No pertinent family history.  Social History:  reports that he has never smoked. He has never used smokeless tobacco. He reports that he drinks about 10.8 ounces of alcohol per week. He reports that he does not use illicit drugs.  Allergies: No Known Allergies  Medications: Prior to Admission:  (Not in a hospital admission)  No results found for this or any previous visit (from the past 48 hour(s)).  Dg Chest 2 View  10/28/2013   CLINICAL DATA:  Shortness of breath  EXAM: CHEST  2 VIEW  COMPARISON:  10/31/2011  FINDINGS: Mild cardiomegaly, stable from prior. Kerley A and B lines noted. No significant effusion. No asymmetric airspace opacity. No acute osseous findings.  IMPRESSION: Mild interstitial pulmonary edema.   Electronically Signed   By: Jorje Guild M.D.   On: 10/28/2013 23:01   Dg Hand Complete Right  10/28/2013   CLINICAL DATA:  Little finger laceration with pain  EXAM: RIGHT HAND - COMPLETE 3+ VIEW  COMPARISON:  None.  FINDINGS: There is extensive soft tissue swelling of the 5th digit, extending into the dorsal hand. No radiodense foreign body. No osseous erosion or asymmetric joint narrowing to suggest osseous/joint infection. No acute fracture or malalignment.  Remote ulnar and radial styloid process fractures.  Degenerative interphalangeal joint narrowing throughout the digits.  IMPRESSION: No evidence of osseous infection.  No radiodense foreign body.   Electronically Signed   By: Jorje Guild M.D.   On: 10/28/2013 23:04    Review of  Systems  Constitutional: Positive for fever.   Blood pressure 151/87, pulse 85, temperature 97.9 F (36.6 C), temperature source Oral, resp. rate 19, SpO2 99.00%. Physical Exam  Constitutional: He is oriented to person, place, and time. He appears well-developed and well-nourished.  HENT:  Head: Normocephalic and atraumatic.  Cardiovascular: Normal rate.   Respiratory: Effort normal.  Musculoskeletal:       Right hand: He exhibits tenderness, laceration and swelling.  Right small finger dactilitis with volar pain and swelling  Neurological: He is alert and oriented to person, place, and time.  Skin: Skin is warm. There is erythema.  Psychiatric: He has a normal mood and affect. His behavior is normal. Judgment and thought content normal.    Assessment/Plan: As above  Plan admission to medicine and OR now for I and D  Kimbrely Buckel A 10/28/2013, 11:34 PM

## 2013-10-28 NOTE — ED Provider Notes (Signed)
CSN: [id number]     Arrival date & time 10/28/13  2136 History   First MD Initiated Contact with Patient 10/28/13 2212     Chief Complaint  Patient presents with  . Cellulitis   (Consider location/radiation/quality/duration/timing/severity/associated sxs/prior Treatment) HPI  Joshua [name] is a 57 y.o. male with past medical history significant for insulin-dependent diabetes complaining of increasing swelling and purulent discharge from right fifth digit. Patient cut himself 10 days ago on a paper cutter at work and since that time swelling has increased. Patient saw his primary care physician at [facility] 6 days ago and was started on Keflex and advised to soak the finger. Patient has had increasing swelling since that time. He denies fever, nausea vomiting. Patient has not been taking his blood sugar at home. He states that he does not get it checked it in several months. Patient has a history of 2 prior toe amputations. Patient rates the pain as moderate, states it really doesn't bother him unless he hit it on something. He denies numbness or weakness. He endorses a reduced range of motion.  Past Medical History  Diagnosis Date  . Diabetes mellitus   . Hypertension   . Hyperlipemia    Past Surgical History  Procedure Laterality Date  . Breast lumpectomy     History reviewed. No pertinent family history. History  Substance Use Topics  . Smoking status: Never Smoker   . Smokeless tobacco: Never Used  . Alcohol Use: 10.8 oz/week    18 Cans of beer per week    Review of Systems 10 systems reviewed and found to be negative, except as noted in the HPI  Allergies  Review of patient's allergies indicates no known allergies.  Home Medications   No current outpatient prescriptions on file. BP 151/87  Pulse 85  Temp(Src) 97.9 F (36.6 C) (Oral)  Resp 19  SpO2 99% Physical Exam  Nursing note and vitals reviewed. Constitutional: He is oriented to person, place, and time. He  appears well-developed and well-nourished. No distress.  HENT:  Head: Normocephalic and atraumatic.  Mouth/Throat: Oropharynx is clear and moist.  Eyes: Conjunctivae and EOM are normal.  Cardiovascular: Normal rate and regular rhythm.   Pulmonary/Chest: Effort normal and breath sounds normal. No stridor. No respiratory distress. He has no wheezes. He has no rales. He exhibits no tenderness.  Abdominal: Soft. He exhibits no distension and no mass. There is no tenderness. There is no rebound and no guarding.  Musculoskeletal: Normal range of motion.  Right fifth digit is extremely swollen, there is a healing laceration to the volar side at the PIP. Purulent material is visible underneath the skin. Patient has significantly reduced to minimal active range of motion. There is Refill, patient is grossly neurovascularly intact and he can feel me touching the tip of the finger tip. Patient does not have significant pain with passive extension  Neurological: He is alert and oriented to person, place, and time.  Psychiatric: He has a normal mood and affect.    ED Course  Procedures (including critical care time) Labs Review Labs Reviewed  CBC - Abnormal; Notable for the following:    Hemoglobin 12.4 (*)    HCT 37.1 (*)    All other components within normal limits  BASIC METABOLIC PANEL   Imaging Review Dg Chest 2 View  10/28/2013   CLINICAL DATA:  Shortness of breath  EXAM: CHEST  2 VIEW  COMPARISON:  10/31/2011  FINDINGS: Mild cardiomegaly, stable from prior. Awanda Mink  A and B lines noted. No significant effusion. No asymmetric airspace opacity. No acute osseous findings.  IMPRESSION: Mild interstitial pulmonary edema.   Electronically Signed   By: Jorje Guild M.D.   On: 10/28/2013 23:01   Dg Hand Complete Right  10/28/2013   CLINICAL DATA:  Little finger laceration with pain  EXAM: RIGHT HAND - COMPLETE 3+ VIEW  COMPARISON:  None.  FINDINGS: There is extensive soft tissue swelling of the 5th  digit, extending into the dorsal hand. No radiodense foreign body. No osseous erosion or asymmetric joint narrowing to suggest osseous/joint infection. No acute fracture or malalignment.  Remote ulnar and radial styloid process fractures.  Degenerative interphalangeal joint narrowing throughout the digits.  IMPRESSION: No evidence of osseous infection.  No radiodense foreign body.   Electronically Signed   By: Jorje Guild M.D.   On: 10/28/2013 23:04    EKG Interpretation     Ventricular Rate:  78 PR Interval:  196 QRS Duration: 86 QT Interval:  373 QTC Calculation: 425 R Axis:   46 Text Interpretation:  Sinus rhythm Probable left atrial enlargement Probable anteroseptal infarct, old Minimal ST elevation, inferior leads No significant change since last tracing                   MDM   1. Abscess of fifth finger, right   2. DIABETES MELLITUS, TYPE II     Filed Vitals:   10/28/13 2149 10/28/13 2315  BP: 151/87   Pulse: 85   Temp: 97.9 F (36.6 C)   TempSrc: Oral   Resp: 20 19  SpO2: 99%      Joshua [name] is a 57 y.o. male with significant abscess and cellulitis and golfing the right fifth digit. Patient reports fever approximately 6 days ago, now resolved. He denies any nausea or vomiting. X-ray shows no signs of osteomyelitis. Hand surgery consult from Dr. [name] appreciated: He will take the patient to the operating room. Patient will be admitted to internal medicine Dr. Bertis Ruddy request. I have spoken to Dr. Eliseo Squires, she will be leaving and will not be able to evaluate the patient before he gets out of surgery, she will sign out the patient to Dr. Orvan Falconer.  Medications  vancomycin (VANCOCIN) 1,500 mg in sodium chloride 0.9 % 500 mL IVPB (not administered)    Note: Portions of this report may have been transcribed using voice recognition software. Every effort was made to ensure accuracy; however, inadvertent computerized transcription errors may be  present      Monico Blitz, PA-C 10/29/13 0022

## 2013-10-28 NOTE — ED Provider Notes (Signed)
Joshua [name] 57 year old male presenting emergency Department for infection of his right pinky finger.  Patient is a diabetic.  He states that he had an injury to the feet finger over the past several weeks.  He has had worsening pain and swelling.  On Monday he had a fever of 101 called his physician's office and was started on Keflex.  He had worsening swelling of the finger and no sensation at the distal tip.  Patient called his on-call physician and was referred here to the emergency department.  Patient has a history of previous toe amputations.  O- vital signs within normal limits.  Afebrile patient is right finger infection.  He has sausagelike digit with bluish gray and.  No sensation and distal tip.  There is obvious purulence which moves just under the corneal addendum of the finger.  He has pain with pressure of the digit.  No pain with passive extension or flexion.  He is able to move the digit.  Assessment/plan-the patient appears to have pulled infection of the right pinky.  Possible necrotic tissue at the distal tip.  The patient will need a surgical debridement.  I have placed orders for evaluation of the finger, basic labs as well as presurgical evaluation.  Discussed the patient with PA Pisciotta.  We will send care  Margarita Mail, PA-C 10/28/13 2301

## 2013-10-28 NOTE — ED Notes (Signed)
Per pt, has infection to rt index finger.  Was seen by primary MD on Monday.  Given antibiotics and soaks at home.  Told to come to ED if worsens.  Pt finger extremely swollen.  Pt had fever on Monday.  Currently still taking antibiotics.

## 2013-10-29 ENCOUNTER — Emergency Department (HOSPITAL_COMMUNITY): Payer: Worker's Compensation | Admitting: Anesthesiology

## 2013-10-29 ENCOUNTER — Encounter (HOSPITAL_COMMUNITY): Payer: Worker's Compensation | Admitting: Anesthesiology

## 2013-10-29 ENCOUNTER — Encounter (HOSPITAL_COMMUNITY): Payer: Self-pay | Admitting: Anesthesiology

## 2013-10-29 DIAGNOSIS — Z9861 Coronary angioplasty status: Secondary | ICD-10-CM

## 2013-10-29 DIAGNOSIS — L03019 Cellulitis of unspecified finger: Principal | ICD-10-CM

## 2013-10-29 DIAGNOSIS — K219 Gastro-esophageal reflux disease without esophagitis: Secondary | ICD-10-CM

## 2013-10-29 DIAGNOSIS — R739 Hyperglycemia, unspecified: Secondary | ICD-10-CM | POA: Diagnosis present

## 2013-10-29 DIAGNOSIS — L02519 Cutaneous abscess of unspecified hand: Principal | ICD-10-CM | POA: Diagnosis present

## 2013-10-29 DIAGNOSIS — E119 Type 2 diabetes mellitus without complications: Secondary | ICD-10-CM | POA: Diagnosis not present

## 2013-10-29 LAB — GLUCOSE, CAPILLARY
Glucose-Capillary: 455 mg/dL — ABNORMAL HIGH (ref 70–99)
Glucose-Capillary: 402 mg/dL — ABNORMAL HIGH (ref 70–99)
Glucose-Capillary: 279 mg/dL — ABNORMAL HIGH (ref 70–99)
Glucose-Capillary: 217 mg/dL — ABNORMAL HIGH (ref 70–99)
Glucose-Capillary: 193 mg/dL — ABNORMAL HIGH (ref 70–99)
Glucose-Capillary: 153 mg/dL — ABNORMAL HIGH (ref 70–99)

## 2013-10-29 LAB — BASIC METABOLIC PANEL
BUN: 22 mg/dL (ref 6–23)
CO2: 24 mEq/L (ref 19–32)
Calcium: 9.4 mg/dL (ref 8.4–10.5)
Chloride: 94 mEq/L — ABNORMAL LOW (ref 96–112)
Creatinine, Ser: 1.21 mg/dL (ref 0.50–1.35)
GFR calc Af Amer: 76 mL/min — ABNORMAL LOW (ref >90)
GFR calc non Af Amer: 66 mL/min — ABNORMAL LOW (ref >90)
Glucose, Bld: 529 mg/dL — ABNORMAL HIGH (ref 70–99)
Potassium: 4.4 mEq/L (ref 3.5–5.1)
Sodium: 131 mEq/L — ABNORMAL LOW (ref 135–145)

## 2013-10-29 LAB — CBC
HCT: 37.1 % — ABNORMAL LOW (ref 39.0–52.0)
Hemoglobin: 12.4 g/dL — ABNORMAL LOW (ref 13.0–17.0)
MCH: 28.4 pg (ref 26.0–34.0)
MCHC: 33.4 g/dL (ref 30.0–36.0)
MCV: 84.9 fL (ref 78.0–100.0)
Platelets: 277 10*3/uL (ref 150–400)
RBC: 4.37 MIL/uL (ref 4.22–5.81)
RDW: 13.3 % (ref 11.5–15.5)
WBC: 8.9 10*3/uL (ref 4.0–10.5)

## 2013-10-29 LAB — GRAM STAIN

## 2013-10-29 MED ORDER — 0.9 % SODIUM CHLORIDE (POUR BTL) OPTIME
TOPICAL | Status: DC | PRN
  Administered 2013-10-29: 1000 mL

## 2013-10-29 MED ORDER — VANCOMYCIN HCL 10 G IV SOLR
1250.0000 mg | Freq: Two times a day (BID) | INTRAVENOUS | Status: DC
  Filled 2013-10-29: qty 1250

## 2013-10-29 MED ORDER — ONDANSETRON HCL 4 MG/2ML IJ SOLN
INTRAMUSCULAR | Status: DC | PRN
  Administered 2013-10-29: 4 mg via INTRAVENOUS

## 2013-10-29 MED ORDER — THIAMINE HCL 100 MG/ML IJ SOLN
100.0000 mg | Freq: Every day | INTRAMUSCULAR | Status: DC
  Filled 2013-10-29 (×5): qty 1

## 2013-10-29 MED ORDER — ADULT MULTIVITAMIN W/MINERALS CH
1.0000 | ORAL_TABLET | Freq: Every day | ORAL | Status: DC
  Administered 2013-10-29 – 2013-11-02 (×3): 1 via ORAL
  Filled 2013-10-29 (×5): qty 1

## 2013-10-29 MED ORDER — CLOPIDOGREL BISULFATE 75 MG PO TABS
75.0000 mg | ORAL_TABLET | Freq: Every day | ORAL | Status: DC
  Administered 2013-10-29 – 2013-11-02 (×3): 75 mg via ORAL
  Filled 2013-10-29 (×5): qty 1

## 2013-10-29 MED ORDER — LACTATED RINGERS IV SOLN
INTRAVENOUS | Status: DC | PRN
  Administered 2013-10-29: via INTRAVENOUS

## 2013-10-29 MED ORDER — FOLIC ACID 1 MG PO TABS
1.0000 mg | ORAL_TABLET | Freq: Every day | ORAL | Status: DC
  Administered 2013-10-29 – 2013-11-02 (×3): 1 mg via ORAL
  Filled 2013-10-29 (×5): qty 1

## 2013-10-29 MED ORDER — CITRIC ACID-SODIUM CITRATE 334-500 MG/5ML PO SOLN
30.0000 mL | Freq: Once | ORAL | Status: AC
  Administered 2013-10-29: 30 mL via ORAL
  Filled 2013-10-29: qty 30

## 2013-10-29 MED ORDER — PNEUMOCOCCAL VAC POLYVALENT 25 MCG/0.5ML IJ INJ
0.5000 mL | INJECTION | INTRAMUSCULAR | Status: AC
  Administered 2013-10-30: 0.5 mL via INTRAMUSCULAR
  Filled 2013-10-29 (×2): qty 0.5

## 2013-10-29 MED ORDER — VITAMIN B-1 100 MG PO TABS
100.0000 mg | ORAL_TABLET | Freq: Every day | ORAL | Status: DC
  Administered 2013-10-29 – 2013-11-02 (×3): 100 mg via ORAL
  Filled 2013-10-29 (×5): qty 1

## 2013-10-29 MED ORDER — LACTATED RINGERS IV SOLN: INTRAVENOUS | Status: DC

## 2013-10-29 MED ORDER — HYDROMORPHONE HCL PF 1 MG/ML IJ SOLN
0.2500 mg | INTRAMUSCULAR | Status: DC | PRN
Start: 2013-10-29 — End: 2013-10-29

## 2013-10-29 MED ORDER — KETOROLAC TROMETHAMINE 30 MG/ML IJ SOLN: 15.0000 mg | Freq: Once | INTRAMUSCULAR | Status: DC | PRN

## 2013-10-29 MED ORDER — CITRIC ACID-SODIUM CITRATE 334-500 MG/5ML PO SOLN
ORAL | Status: AC
  Filled 2013-10-29: qty 15

## 2013-10-29 MED ORDER — PROMETHAZINE HCL 25 MG/ML IJ SOLN: 6.2500 mg | INTRAMUSCULAR | Status: DC | PRN

## 2013-10-29 MED ORDER — TETANUS-DIPHTHERIA TOXOIDS TD 5-2 LFU IM INJ
0.5000 mL | INJECTION | Freq: Once | INTRAMUSCULAR | Status: AC
  Administered 2013-10-29: 0.5 mL via INTRAMUSCULAR
  Filled 2013-10-29: qty 0.5

## 2013-10-29 MED ORDER — LORAZEPAM 2 MG/ML IJ SOLN: 0.0000 mg | Freq: Four times a day (QID) | INTRAMUSCULAR | Status: AC

## 2013-10-29 MED ORDER — INSULIN ASPART 100 UNIT/ML ~~LOC~~ SOLN
SUBCUTANEOUS | Status: AC
  Filled 2013-10-29: qty 1

## 2013-10-29 MED ORDER — METOPROLOL SUCCINATE ER 100 MG PO TB24
100.0000 mg | ORAL_TABLET | Freq: Every day | ORAL | Status: DC
  Administered 2013-10-29 – 2013-11-02 (×5): 100 mg via ORAL
  Filled 2013-10-29 (×5): qty 1

## 2013-10-29 MED ORDER — INSULIN ASPART 100 UNIT/ML ~~LOC~~ SOLN
10.0000 [IU] | Freq: Three times a day (TID) | SUBCUTANEOUS | Status: DC
  Administered 2013-10-29 – 2013-10-31 (×4): 10 [IU] via SUBCUTANEOUS

## 2013-10-29 MED ORDER — INSULIN GLARGINE 100 UNIT/ML ~~LOC~~ SOLN
30.0000 [IU] | Freq: Every day | SUBCUTANEOUS | Status: DC
  Administered 2013-10-30: 30 [IU] via SUBCUTANEOUS
  Filled 2013-10-29 (×3): qty 0.3

## 2013-10-29 MED ORDER — ONDANSETRON HCL 4 MG/2ML IJ SOLN: 4.0000 mg | Freq: Four times a day (QID) | INTRAMUSCULAR | Status: DC | PRN

## 2013-10-29 MED ORDER — INSULIN ASPART 100 UNIT/ML ~~LOC~~ SOLN
0.0000 [IU] | Freq: Every day | SUBCUTANEOUS | Status: DC
  Administered 2013-10-29 – 2013-10-30 (×2): 2 [IU] via SUBCUTANEOUS
  Administered 2013-10-31: 3 [IU] via SUBCUTANEOUS

## 2013-10-29 MED ORDER — SODIUM CHLORIDE 0.9 % IV SOLN
1.5000 g | Freq: Four times a day (QID) | INTRAVENOUS | Status: DC
  Administered 2013-10-29 – 2013-11-01 (×10): 1.5 g via INTRAVENOUS
  Filled 2013-10-29 (×12): qty 1.5

## 2013-10-29 MED ORDER — FENTANYL CITRATE 0.05 MG/ML IJ SOLN
INTRAMUSCULAR | Status: DC | PRN
  Administered 2013-10-29: 100 ug via INTRAVENOUS

## 2013-10-29 MED ORDER — HYDROCHLOROTHIAZIDE 12.5 MG PO CAPS
12.5000 mg | ORAL_CAPSULE | Freq: Every day | ORAL | Status: DC
  Administered 2013-10-29 – 2013-11-02 (×3): 12.5 mg via ORAL
  Filled 2013-10-29 (×5): qty 1

## 2013-10-29 MED ORDER — INFLUENZA VAC SPLIT QUAD 0.5 ML IM SUSP
0.5000 mL | INTRAMUSCULAR | Status: AC
  Administered 2013-10-30: 0.5 mL via INTRAMUSCULAR
  Filled 2013-10-29 (×2): qty 0.5

## 2013-10-29 MED ORDER — PROPOFOL 10 MG/ML IV BOLUS
INTRAVENOUS | Status: DC | PRN
  Administered 2013-10-29: 200 mg via INTRAVENOUS

## 2013-10-29 MED ORDER — NITROGLYCERIN 0.4 MG SL SUBL: 0.4000 mg | SUBLINGUAL_TABLET | SUBLINGUAL | Status: DC | PRN

## 2013-10-29 MED ORDER — CITRIC ACID-SODIUM CITRATE 334-500 MG/5ML PO SOLN
ORAL | Status: DC | PRN
  Administered 2013-10-29: 15 mL via ORAL

## 2013-10-29 MED ORDER — LORAZEPAM 2 MG/ML IJ SOLN: 0.0000 mg | Freq: Two times a day (BID) | INTRAMUSCULAR | Status: AC

## 2013-10-29 MED ORDER — LORAZEPAM 2 MG/ML IJ SOLN: 1.0000 mg | Freq: Four times a day (QID) | INTRAMUSCULAR | Status: AC | PRN

## 2013-10-29 MED ORDER — DOCUSATE SODIUM 100 MG PO CAPS
100.0000 mg | ORAL_CAPSULE | Freq: Two times a day (BID) | ORAL | Status: DC
  Administered 2013-10-29 – 2013-11-02 (×7): 100 mg via ORAL

## 2013-10-29 MED ORDER — MEPERIDINE HCL 50 MG/ML IJ SOLN: 6.2500 mg | INTRAMUSCULAR | Status: DC | PRN

## 2013-10-29 MED ORDER — SODIUM CHLORIDE 0.9 % IV SOLN
INTRAVENOUS | Status: DC
  Administered 2013-10-29 – 2013-11-01 (×5): via INTRAVENOUS

## 2013-10-29 MED ORDER — ALLOPURINOL 300 MG PO TABS
300.0000 mg | ORAL_TABLET | Freq: Every day | ORAL | Status: DC
  Administered 2013-10-29 – 2013-11-02 (×3): 300 mg via ORAL
  Filled 2013-10-29 (×5): qty 1

## 2013-10-29 MED ORDER — HYDROMORPHONE HCL PF 1 MG/ML IJ SOLN
1.0000 mg | INTRAMUSCULAR | Status: DC | PRN
  Administered 2013-10-29 – 2013-10-31 (×7): 1 mg via INTRAVENOUS
  Filled 2013-10-29 (×7): qty 1

## 2013-10-29 MED ORDER — SUCCINYLCHOLINE CHLORIDE 20 MG/ML IJ SOLN
INTRAMUSCULAR | Status: DC | PRN
  Administered 2013-10-29: 140 mg via INTRAVENOUS

## 2013-10-29 MED ORDER — VANCOMYCIN HCL 10 G IV SOLR
1500.0000 mg | Freq: Once | INTRAVENOUS | Status: AC
  Administered 2013-10-29: 1500 mg via INTRAVENOUS
  Filled 2013-10-29: qty 1500

## 2013-10-29 MED ORDER — MIDAZOLAM HCL 5 MG/5ML IJ SOLN
INTRAMUSCULAR | Status: DC | PRN
  Administered 2013-10-29: 2 mg via INTRAVENOUS

## 2013-10-29 MED ORDER — INSULIN ASPART 100 UNIT/ML ~~LOC~~ SOLN
0.0000 [IU] | Freq: Three times a day (TID) | SUBCUTANEOUS | Status: DC
  Administered 2013-10-29: 3 [IU] via SUBCUTANEOUS
  Administered 2013-10-29: 8 [IU] via SUBCUTANEOUS
  Administered 2013-10-29: 5 [IU] via SUBCUTANEOUS
  Administered 2013-10-30: 2 [IU] via SUBCUTANEOUS
  Administered 2013-10-30: 3 [IU] via SUBCUTANEOUS
  Administered 2013-10-31: 8 [IU] via SUBCUTANEOUS
  Administered 2013-10-31: 5 [IU] via SUBCUTANEOUS

## 2013-10-29 MED ORDER — INSULIN GLARGINE 100 UNIT/ML ~~LOC~~ SOLN
20.0000 [IU] | Freq: Two times a day (BID) | SUBCUTANEOUS | Status: DC
  Administered 2013-10-29: 20 [IU] via SUBCUTANEOUS
  Filled 2013-10-29 (×3): qty 0.2

## 2013-10-29 MED ORDER — LORAZEPAM 1 MG PO TABS: 1.0000 mg | ORAL_TABLET | Freq: Four times a day (QID) | ORAL | Status: AC | PRN

## 2013-10-29 MED ORDER — SODIUM CHLORIDE 0.9 % IR SOLN
Status: DC | PRN
  Administered 2013-10-29: 3000 mL

## 2013-10-29 MED ORDER — BUPIVACAINE HCL (PF) 0.25 % IJ SOLN
INTRAMUSCULAR | Status: AC
  Filled 2013-10-29: qty 30

## 2013-10-29 MED ORDER — INSULIN ASPART PROT & ASPART (70-30 MIX) 100 UNIT/ML ~~LOC~~ SUSP
40.0000 [IU] | Freq: Once | SUBCUTANEOUS | Status: AC
  Administered 2013-10-29: 40 [IU] via SUBCUTANEOUS
  Filled 2013-10-29: qty 10

## 2013-10-29 MED ORDER — ONDANSETRON HCL 4 MG PO TABS: 4.0000 mg | ORAL_TABLET | Freq: Four times a day (QID) | ORAL | Status: DC | PRN

## 2013-10-29 MED ORDER — BUPIVACAINE HCL (PF) 0.25 % IJ SOLN
INTRAMUSCULAR | Status: DC | PRN
  Administered 2013-10-29: 30 mL

## 2013-10-29 NOTE — Progress Notes (Addendum)
Offered to assist pt with cpap for rest.  Pt stated he can/will place it on himself later when ready for bed.  Cpap noted on autotitration mode 6-20cm h2o per previous night's settings.  Sterile water in chamber near fill line.  Pt was advised that RT is available all night should he need further assistance to let his nurse know.

## 2013-10-29 NOTE — Op Note (Signed)
See note [phone]

## 2013-10-29 NOTE — Progress Notes (Signed)
I was called by surgeon Dr. [name] about gram stain results.  Gram negative rods seen.  Will switch antibiotics to cover gram neg rods.  NPO after midnight in anticipation of surgery tomorrow.    Murvin Natal, MD

## 2013-10-29 NOTE — Progress Notes (Addendum)
ANTIBIOTIC CONSULT NOTE - INITIAL  Pharmacy Consult for vancomycin Indication: infected R hand  No Known Allergies  Patient Measurements: Height: 5\' 9"  (175.3 cm) Weight: 282 lb (127.914 kg) IBW/kg (Calculated) : 70.7   Vital Signs: Temp: 98.6 F (37 C) (11/17 1007) Temp src: Oral (11/17 1007) BP: 132/75 mmHg (11/17 1007) Pulse Rate: 74 (11/17 1007) Intake/Output from previous day: 11/16 0701 - 11/17 0700 In: 1138.3 [I.V.:638.3; IV Piggyback:500] Out: 800 [Urine:800] Intake/Output from this shift: Total I/O In: 920 [P.O.:720; I.V.:200] Out: 800 [Urine:800]  Labs:  Recent Labs  10/28/13 2300  WBC 8.9  HGB 12.4*  PLT 277  CREATININE 1.21   Estimated Creatinine Clearance: 91.3 ml/min (by C-G formula based on Cr of 1.21). No results found for this basename: VANCOTROUGH, VANCOPEAK, VANCORANDOM, GENTTROUGH, GENTPEAK, GENTRANDOM, TOBRATROUGH, TOBRAPEAK, TOBRARND, AMIKACINPEAK, AMIKACINTROU, AMIKACIN,  in the last 72 hours   Microbiology: Recent Results (from the past 720 hour(s))  GRAM STAIN     Status: None   Collection Time    10/29/13 12:42 AM      Result Value Range Status   Specimen Description WOUND RIGHT SMALL FINGER INFECTION   Final   Special Requests PATIENT ON FOLLOWING KEFLEX   Final   Gram Stain     Final   Value: FEW SQUAMOUS EPITHELIAL CELLS PRESENT     FEW WBC PRESENT, PREDOMINANTLY PMN     FEW GRAM NEGATIVE RODS     Gram Stain Report Called to,Read Back By and Verified With: DR.WEINGOLD AT 0205 ON 10/29/13 BY HOBBINS, J.   Report Status 10/29/2013 FINAL   Final  GRAM STAIN     Status: None   Collection Time    10/29/13 12:48 AM      Result Value Range Status   Specimen Description WOUND RIGHT SMALL FINGER DEEP   Final   Special Requests PATIENT ON FOLLOWING KEFLEX   Final   Gram Stain     Final   Value: RARE SQUAMOUS EPITHELIAL CELLS PRESENT     RARE WBC PRESENT, PREDOMINANTLY PMN     RARE GRAM NEGATIVE RODS     Gram Stain Report Called  to,Read Back By and Verified With: DR. [name] AT 0205 ON 10/29/13 BY HOBBINS, J.   Report Status 10/29/2013 FINAL   Final    Medical History: Past Medical History  Diagnosis Date  . Uncontrolled diabetes mellitus with complications   . Hypertension   . Hyperlipemia   . GERD (gastroesophageal reflux disease)   . Diabetic gastroparesis   . Polyneuropathy in diabetes(357.2)     Medications:  Scheduled:  . allopurinol  300 mg Oral Daily  . clopidogrel  75 mg Oral Daily  . docusate sodium  100 mg Oral BID  . folic acid  1 mg Oral Daily  . hydrochlorothiazide  12.5 mg Oral Daily  . [START ON 10/30/2013] influenza vac split quadrivalent PF  0.5 mL Intramuscular Tomorrow-1000  . insulin aspart  0-15 Units Subcutaneous TID WC  . insulin aspart  0-5 Units Subcutaneous QHS  . insulin aspart  10 Units Subcutaneous TID WC  . [START ON 10/30/2013] insulin glargine  30 Units Subcutaneous Daily  . LORazepam  0-4 mg Intravenous Q6H   Followed by  . [START ON 10/31/2013] LORazepam  0-4 mg Intravenous Q12H  . metoprolol succinate  100 mg Oral Daily  . multivitamin with minerals  1 tablet Oral Daily  . [START ON 10/30/2013] pneumococcal 23 valent vaccine  0.5 mL Intramuscular Tomorrow-1000  .  thiamine  100 mg Oral Daily   Or  . thiamine  100 mg Intravenous Daily   Infusions:  . sodium chloride 50 mL/hr at 10/29/13 0414   PRN: HYDROmorphone (DILAUDID) injection, LORazepam, LORazepam, nitroGLYCERIN, ondansetron (ZOFRAN) IV, ondansetron  Assessment: 57 y/o M with DM, accidentally cut finger at work 10 days ago and developed infection unresponsive to oral Keflex, now s/p I&D of R 5th finger.  Received vancomycin 1500 mg IV x 1 dose at 6825 11/17, to continue vancomycin IV with pharmacy dosing.  Goal of Therapy:  Eradication of infection Vancomycin trough 15-20  Plan:  1. Vancomycin 1250 mg IV q12h 2. Follow serum creatinine 3. Check vancomycin trough at steady-state 4. Await culture  data.  Clayburn Pert, PharmD, BCPS Pager: [phone] 10/29/2013  2:10 PM

## 2013-10-29 NOTE — Transfer of Care (Signed)
Immediate Anesthesia Transfer of Care Note  Patient: Joshua [name]  Procedure(s) Performed: Procedure(s): INCISION AND DRAINAGE Right small finger (Right)  Patient Location: PACU  Anesthesia Type:General  Level of Consciousness: awake, alert , oriented and patient cooperative  Airway & Oxygen Therapy: Patient Spontanous Breathing and Patient connected to face mask oxygen  Post-op Assessment: Report given to PACU RN and Post -op Vital signs reviewed and stable  Post vital signs: Reviewed and stable  Complications: No apparent anesthesia complications

## 2013-10-29 NOTE — Care Management Note (Addendum)
    Page 1 of 1   11/02/2013     6:09:48 PM   CARE MANAGEMENT NOTE 11/02/2013  Patient:  Joshua [name], Joshua [name]   Account Number:  [id number]  Date Initiated:  10/29/2013  Documentation initiated by:  Sherrin Daisy  Subjective/Objective Assessment:   dx infection rt hand; I&D  pcp-[facility]  has prescription coverage and uses [facility] [address]     Action/Plan:   Home upon discharge.  Plans to return to his home in [place]  where he lives with sons and spouse. Pt is able to ambulate. Rt handed.   Anticipated DC Date:  11/01/2013   Anticipated DC Plan:  [place]  CM consult      Choice offered to / List presented to:  C-1 Patient           Status of service:  Completed, signed off Medicare Important Message given?   (If response is "NO", the following Medicare IM given date fields will be blank) Date Medicare IM given:   Date Additional Medicare IM given:    Discharge Disposition:  HOME/SELF CARE  Per UR Regulation:  Reviewed for med. necessity/level of care/duration of stay  If discussed at [place] of Stay Meetings, dates discussed:    Comments:  11/02/2013 [place] [phone] Pt discharged to home no HH needs.

## 2013-10-29 NOTE — ED Provider Notes (Signed)
Medical screening examination/treatment/procedure(s) were performed by non-physician practitioner and as supervising physician I was immediately available for consultation/collaboration.  EKG Interpretation     Ventricular Rate:  78 PR Interval:  196 QRS Duration: 86 QT Interval:  373 QTC Calculation: 425 R Axis:   46 Text Interpretation:  Sinus rhythm Probable left atrial enlargement Probable anteroseptal infarct, old Minimal ST elevation, inferior leads No significant change since last tracing              Houston Siren, MD 10/29/13 [phone]

## 2013-10-29 NOTE — Progress Notes (Signed)
Pt was seen and examined.  H&P and orders reviewed.  New orders placed.   Gerlene Fee, MD, CDE, [place], [place]

## 2013-10-29 NOTE — Anesthesia Postprocedure Evaluation (Signed)
Anesthesia Post Note  Patient: Joshua [name]  Procedure(s) Performed: Procedure(s) (LRB): INCISION AND DRAINAGE Right small finger (Right)  Anesthesia type: General  Patient location: PACU  Post pain: Pain level controlled  Post assessment: Post-op Vital signs reviewed  Last Vitals:  Filed Vitals:   10/28/13 2315  BP:   Pulse:   Temp:   Resp: 19    Post vital signs: Reviewed  Level of consciousness: sedated  Complications: No apparent anesthesia complications

## 2013-10-29 NOTE — Progress Notes (Signed)
Patient underwent I and D of infected flexor sheath of right small finger with wound packed open   Will need repeat I and D with possible wound closure on  11/18

## 2013-10-29 NOTE — Op Note (Signed)
Joshua [name], Joshua NO.:  [id number]  MEDICAL RECORD NO.:  [id number]  LOCATION:  [facility]                         FACILITY:  [facility]  PHYSICIAN:  Sheral Apley. Laranda Burkemper, M.D.DATE OF BIRTH:  1956/10/28  DATE OF PROCEDURE:  10/29/2013 DATE OF DISCHARGE:                              OPERATIVE REPORT   PREOPERATIVE DIAGNOSIS:  Right small finger flexor sheath infection.  POSTOPERATIVE DIAGNOSIS:  Right small finger flexor sheath infection.  PROCEDURE:  Incision and drainage of above.  SURGEON:  Sheral Apley. [name], M.D.  ASSISTANT:  None.  ANESTHESIA:  General.  COMPLICATIONS:  None.  DRAINS:  None.  Cultures x2 were sent.  DESCRIPTION OF PROCEDURE:  The patient was taken to the operating suite after induction of adequate general anesthesia.  Right upper extremity was prepped and draped in usual sterile fashion.  An Esmarch was used to exsanguinate the limb.  Tourniquet was inflated to 275 mmHg.  At this point in time, [name] incision was made over the right small finger starting distally radial going proximal ulnar and then crossing back radial and then back ulnar.  Flaps were raised, there were significant purulence encounters superficial and deep.  The superficial purulence was cultured.  The deep flexor sheath purulence was cultured as well. We raised flaps, identified the neurovascular bundles and then took 6 liters of normal saline with cystoscopy tubing and did a thorough irrigation of the flexor sheath.  We then packed it open with 1 x 8 Xeroform followed by 4x4s fluffs and compression wrap.  The patient tolerated the procedure well, went to the recovery room in stable fashion.     Sheral Apley [name], M.D.     MAW/MEDQ  D:  10/29/2013  T:  10/29/2013  Job:  [id number]

## 2013-10-29 NOTE — H&P (Signed)
[facility] History and Physical  Joshua [name] GEF:[id number] DOB: Jan 25, 1956    PCP:   Lolita Patella, MD   Chief Complaint: infected left hand.  HPI: Joshua [name] is a 57 y.o. male with past medical history significant for insulin-dependent diabetes complaining of increasing swelling and purulent discharge from right fifth digit. Patient works at a [employer] and cut his finger 10 days ago.  He was seen and was given Keflex, but it didn't get better.  He was taken to the OR by Dr Dairl Ponder, and had I and D of his left 5th finger.  Hospitalist was asked to admit him because of his DM and HTN.  He didn't recall having an up to date Tetanus shot.  He also has known CAD and had 2 cardiac stent placement.  He has sleep apnea, and has been wearing his CPAP machine.  He works night shifts, so he sleep during the day.  Serology was unremarkable except for the BS that was elevated.  I was asked to admit him from the PACU.  He was alert, in no distress, and was conversing meaningfully to me.  He does drink about 3 beers per day.  Rewiew of Systems:   ENMT: Negative for ear pain, hoarseness, nasal congestion, sinus pressure and sore throat. No headaches; tinnitus, drooling, or problem swallowing. Cardiovascular: Negative for chest pain, palpitations, diaphoresis, dyspnea and peripheral edema. ; No orthopnea, PND Respiratory: Negative for cough, hemoptysis, wheezing and stridor. No pleuritic chestpain. Gastrointestinal: Negative for nausea, vomiting, diarrhea, constipation, abdominal pain, melena, blood in stool, hematemesis, jaundice and rectal bleeding.    Genitourinary: Negative for frequency, dysuria, incontinence,flank pain and hematuria; Musculoskeletal: Negative for back pain and neck pain. Skin: . Negative for pruritus, rash, abrasions, bruising and skin lesion.; ulcerations Neuro: Negative for headache, lightheadedness and neck stiffness. Negative for  weakness, altered level of consciousness , altered mental status, extremity weakness, burning feet, involuntary movement, seizure and syncope.  Psych: negative for anxiety, depression, insomnia, tearfulness, panic attacks, hallucinations, paranoia, suicidal or homicidal ideation    Past Medical History  Diagnosis Date  . Diabetes mellitus   . Hypertension   . Hyperlipemia     Past Surgical History  Procedure Laterality Date  . Breast lumpectomy      Medications:  HOME MEDS: Prior to Admission medications   Medication Sig Start Date End Date Taking? Authorizing Provider  allopurinol (ZYLOPRIM) 300 MG tablet Take 300 mg by mouth daily.    Yes Historical Provider, MD  clopidogrel (PLAVIX) 75 MG tablet Take 1 tablet by mouth daily. 09/24/13  Yes Historical Provider, MD  hydrochlorothiazide (HYDRODIURIL) 25 MG tablet Take 0.5 tablets by mouth daily. 09/07/13  Yes Historical Provider, MD  metFORMIN (GLUCOPHAGE) 500 MG tablet Take 500 mg by mouth 2 (two) times daily with a meal.   Yes Historical Provider, MD  metoprolol succinate (TOPROL-XL) 100 MG 24 hr tablet Take 1 tablet (100 mg total) by mouth daily. Take with or immediately following a meal. 10/09/13  Yes Lennette Bihari, MD  spironolactone (ALDACTONE) 25 MG tablet Take 0.5 tablets (12.5 mg total) by mouth daily. 07/07/13  Yes Lennette Bihari, MD  esomeprazole (NEXIUM) 40 MG capsule Take 1 capsule (40 mg total) by mouth daily. 10/30/11 10/29/12  Gavin Pound. Ghim, MD  nitroGLYCERIN (NITROSTAT) 0.4 MG SL tablet Place 1 tablet (0.4 mg total) under the tongue every 5 (five) minutes as needed for chest pain. 10/19/13   Lennette Bihari,  MD     Allergies:  No Known Allergies  Social History:   reports that he has never smoked. He has never used smokeless tobacco. He reports that he drinks about 10.8 ounces of alcohol per week. He reports that he does not use illicit drugs.  Family History: History reviewed. No pertinent family  history.   Physical Exam: Filed Vitals:   10/29/13 0200 10/29/13 0215 10/29/13 0230 10/29/13 0245  BP: 139/67 138/77 143/82 156/79  Pulse: 71 69 68 69  Temp: 99.3 F (37.4 C)     TempSrc:      Resp: $Remo'14 16 15 18  'KcMsT$ SpO2: 97% 97% 97% 99%   Blood pressure 156/79, pulse 69, temperature 99.3 F (37.4 C), temperature source Oral, resp. rate 18, SpO2 99.00%.  GEN:  Pleasant patient lying in the stretcher in no acute distress; cooperative with exam. PSYCH:  alert and oriented x4; does not appear anxious or depressed; affect is appropriate. HEENT: Mucous membranes pink and anicteric; PERRLA; EOM intact; no cervical lymphadenopathy nor thyromegaly or carotid bruit; no JVD; There were no stridor. Neck is very supple. Breasts:: Not examined CHEST WALL: No tenderness CHEST: Normal respiration, clear to auscultation bilaterally.  HEART: Regular rate and rhythm.  There are no murmur, rub, or gallops.   BACK: No kyphosis or scoliosis; no CVA tenderness ABDOMEN: soft and non-tender; no masses, no organomegaly, normal abdominal bowel sounds; no pannus; no intertriginous candida. There is no rebound and no distention. Rectal Exam: Not done EXTREMITIES: No bone or joint deformity; age-appropriate arthropathy of the hands and knees; no edema; no ulcerations.  There is no calf tenderness.  His right hand is in wrap. Genitalia: not examined PULSES: 2+ and symmetric SKIN: Normal hydration no rash or ulceration CNS: Cranial nerves 2-12 grossly intact no focal lateralizing neurologic deficit.  Speech is fluent; uvula elevated with phonation, facial symmetry and tongue midline. DTR are normal bilaterally, cerebella exam is intact, barbinski is negative and strengths are equaled bilaterally.  No sensory loss.   Labs on Admission:  Basic Metabolic Panel:  Recent Labs Lab 10/28/13 2300  NA 131*  K 4.4  CL 94*  CO2 24  GLUCOSE 529*  BUN 22  CREATININE 1.21  CALCIUM 9.4   Liver Function Tests: No  results found for this basename: AST, ALT, ALKPHOS, BILITOT, PROT, ALBUMIN,  in the last 168 hours No results found for this basename: LIPASE, AMYLASE,  in the last 168 hours No results found for this basename: AMMONIA,  in the last 168 hours CBC:  Recent Labs Lab 10/28/13 2300  WBC 8.9  HGB 12.4*  HCT 37.1*  MCV 84.9  PLT 277   Cardiac Enzymes: No results found for this basename: CKTOTAL, CKMB, CKMBINDEX, TROPONINI,  in the last 168 hours  CBG:  Recent Labs Lab 10/29/13 0141  GLUCAP 455*     Radiological Exams on Admission: Dg Chest 2 View  10/28/2013   CLINICAL DATA:  Shortness of breath  EXAM: CHEST  2 VIEW  COMPARISON:  10/31/2011  FINDINGS: Mild cardiomegaly, stable from prior. Ker[place] A and B lines noted. No significant effusion. No asymmetric airspace opacity. No acute osseous findings.  IMPRESSION: Mild interstitial pulmonary edema.   Electronically Signed   By: Jorje Guild M.D.   On: 10/28/2013 23:01   Dg Hand Complete Right  10/28/2013   CLINICAL DATA:  Little finger laceration with pain  EXAM: RIGHT HAND - COMPLETE 3+ VIEW  COMPARISON:  None.  FINDINGS: There is  extensive soft tissue swelling of the 5th digit, extending into the dorsal hand. No radiodense foreign body. No osseous erosion or asymmetric joint narrowing to suggest osseous/joint infection. No acute fracture or malalignment.  Remote ulnar and radial styloid process fractures.  Degenerative interphalangeal joint narrowing throughout the digits.  IMPRESSION: No evidence of osseous infection.  No radiodense foreign body.   Electronically Signed   By: Jorje Guild M.D.   On: 10/28/2013 23:04   Assessment/Plan Present on Admission:  . DIABETES MELLITUS, TYPE II . HYPERLIPIDEMIA . GERD . HYPERTENSION Right hand abcess, s/p I and D.  PLAN:  Hand abcess will be manage by ortho.  We will continue with IV Vancomycin.  Ortho planned to take him back to the OR on 11/18, so I have made him NPO after midnight  10/29/13.  For his DM, will reduce his Mixtard 70/30 at 40 units BID (home dose) to Lantus 20 units BID, and add moderate SSI.  Will hold his metformin for now, not knowing how much he will be able to eat.  He will also needs Tetanus and tetanus toxoid, so will give him a dose tonight.  His home meds will be continued.  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  Lastly, he does drink alcohol regularly, and will be placed on CIWA protocol.  He is stable, full code, and will be [facility] to [facility] service.  Thank you for allowing me to participate in his care.    Other plans as per orders.  Code Status: FULL Haskel Khan, MD. [facility] Pager [phone] 7pm to 7am.  10/29/2013, 3:00 AM

## 2013-10-29 NOTE — Progress Notes (Signed)
Placed patient on CPAP. Patient not sure about his home settings. Patient on Auto Titrate and is tolerating well with 2 L of oxygen bleed in. Water in chamber for humidifacation. Will monitor as needed.

## 2013-10-29 NOTE — ED Provider Notes (Signed)
Medical screening examination/treatment/procedure(s) were conducted as a shared visit with non-physician practitioner(s) and myself.  I personally evaluated the patient during the encounter.  Sausage deformity of right fifth digit with obvious purulent contents. Will consult hand surgery for emergent I&D.  EKG Interpretation     Ventricular Rate:  78 PR Interval:  196 QRS Duration: 86 QT Interval:  373 QTC Calculation: 425 R Axis:   46 Text Interpretation:  Sinus rhythm Probable left atrial enlargement Probable anteroseptal infarct, old Minimal ST elevation, inferior leads No significant change since last tracing              Wynetta Fines, MD 10/29/13 [phone]

## 2013-10-29 NOTE — Anesthesia Preprocedure Evaluation (Addendum)
Anesthesia Evaluation  Patient identified by MRN, date of birth, ID band Patient awake    Reviewed: Allergy & Precautions, H&P , NPO status , Patient's Chart, lab work & pertinent test results, reviewed documented beta blocker date and time   Airway Mallampati: II TM Distance: >3 FB Neck ROM: full    Dental no notable dental hx.    Pulmonary neg pulmonary ROS,    Pulmonary exam normal       Cardiovascular Exercise Tolerance: Good hypertension, Pt. on home beta blockers + Past MI negative cardio ROS   PCI in 2012 with no problems since.   Neuro/Psych negative neurological ROS  negative psych ROS   GI/Hepatic Neg liver ROS, GERD-  Controlled,  Endo/Other  diabetes, Type 2, Insulin Dependent and Oral Hypoglycemic AgentsMorbid obesity  Renal/GU negative Renal ROS  negative genitourinary   Musculoskeletal   Abdominal (+) + obese,   Peds  Hematology   Anesthesia Other Findings   Reproductive/Obstetrics negative OB ROS                          Anesthesia Physical Anesthesia Plan  ASA: III and emergent  Anesthesia Plan: General   Post-op Pain Management:    Induction: Intravenous, Rapid sequence and Cricoid pressure planned  Airway Management Planned: Oral ETT  Additional Equipment:   Intra-op Plan:   Post-operative Plan: Extubation in OR  Informed Consent: I have reviewed the patients History and Physical, chart, labs and discussed the procedure including the risks, benefits and alternatives for the proposed anesthesia with the patient or authorized representative who has indicated his/her understanding and acceptance.     Plan Discussed with: CRNA and Surgeon  Anesthesia Plan Comments:        Anesthesia Quick Evaluation

## 2013-10-30 ENCOUNTER — Encounter (HOSPITAL_COMMUNITY): Payer: Self-pay | Admitting: Anesthesiology

## 2013-10-30 ENCOUNTER — Inpatient Hospital Stay (HOSPITAL_COMMUNITY): Payer: Worker's Compensation | Admitting: Certified Registered Nurse Anesthetist

## 2013-10-30 ENCOUNTER — Encounter (HOSPITAL_COMMUNITY)
Admission: EM | Disposition: A | Discharge status: Home / Self Care | Payer: Self-pay | Source: Home / Self Care | Attending: Internal Medicine

## 2013-10-30 ENCOUNTER — Encounter (HOSPITAL_COMMUNITY): Payer: Worker's Compensation | Admitting: Certified Registered Nurse Anesthetist

## 2013-10-30 DIAGNOSIS — IMO0001 Reserved for inherently not codable concepts without codable children: Secondary | ICD-10-CM

## 2013-10-30 HISTORY — PX: I&D EXTREMITY: SHX5045

## 2013-10-30 LAB — TSH: TSH: 0.593 u[IU]/mL (ref 0.350–4.500)

## 2013-10-30 LAB — COMPREHENSIVE METABOLIC PANEL
ALT: 9 U/L (ref 0–53)
AST: 10 U/L (ref 0–37)
Albumin: 2.5 g/dL — ABNORMAL LOW (ref 3.5–5.2)
Alkaline Phosphatase: 109 U/L (ref 39–117)
BUN: 15 mg/dL (ref 6–23)
CO2: 26 mEq/L (ref 19–32)
Calcium: 9.1 mg/dL (ref 8.4–10.5)
Chloride: 100 mEq/L (ref 96–112)
Creatinine, Ser: 1.11 mg/dL (ref 0.50–1.35)
GFR calc Af Amer: 85 mL/min — ABNORMAL LOW (ref >90)
GFR calc non Af Amer: 73 mL/min — ABNORMAL LOW (ref >90)
Glucose, Bld: 182 mg/dL — ABNORMAL HIGH (ref 70–99)
Potassium: 3.5 mEq/L (ref 3.5–5.1)
Sodium: 137 mEq/L (ref 135–145)
Total Bilirubin: 0.2 mg/dL — ABNORMAL LOW (ref 0.3–1.2)
Total Protein: 6.9 g/dL (ref 6.0–8.3)

## 2013-10-30 LAB — CBC WITH DIFFERENTIAL/PLATELET
Basophils Absolute: 0 10*3/uL (ref 0.0–0.1)
Basophils Relative: 0 % (ref 0–1)
Eosinophils Absolute: 0.1 10*3/uL (ref 0.0–0.7)
Eosinophils Relative: 1 % (ref 0–5)
HCT: 36.4 % — ABNORMAL LOW (ref 39.0–52.0)
Hemoglobin: 11.8 g/dL — ABNORMAL LOW (ref 13.0–17.0)
Lymphocytes Relative: 29 % (ref 12–46)
Lymphs Abs: 1.9 10*3/uL (ref 0.7–4.0)
MCH: 27.6 pg (ref 26.0–34.0)
MCHC: 32.4 g/dL (ref 30.0–36.0)
MCV: 85 fL (ref 78.0–100.0)
Monocytes Absolute: 0.6 10*3/uL (ref 0.1–1.0)
Monocytes Relative: 10 % (ref 3–12)
Neutro Abs: 4 10*3/uL (ref 1.7–7.7)
Neutrophils Relative %: 60 % (ref 43–77)
Platelets: 259 10*3/uL (ref 150–400)
RBC: 4.28 MIL/uL (ref 4.22–5.81)
RDW: 13.4 % (ref 11.5–15.5)
WBC: 6.6 10*3/uL (ref 4.0–10.5)

## 2013-10-30 LAB — GRAM STAIN

## 2013-10-30 LAB — GLUCOSE, CAPILLARY
Glucose-Capillary: 235 mg/dL — ABNORMAL HIGH (ref 70–99)
Glucose-Capillary: 149 mg/dL — ABNORMAL HIGH (ref 70–99)
Glucose-Capillary: 164 mg/dL — ABNORMAL HIGH (ref 70–99)
Glucose-Capillary: 118 mg/dL — ABNORMAL HIGH (ref 70–99)

## 2013-10-30 LAB — VITAMIN D 25 HYDROXY (VIT D DEFICIENCY, FRACTURES): Vit D, 25-Hydroxy: 15 ng/mL — ABNORMAL LOW (ref 30–89)

## 2013-10-30 SURGERY — IRRIGATION AND DEBRIDEMENT EXTREMITY
Anesthesia: General | Laterality: Right | Wound class: Dirty or Infected

## 2013-10-30 MED ORDER — ONDANSETRON HCL 4 MG/2ML IJ SOLN
INTRAMUSCULAR | Status: DC | PRN
  Administered 2013-10-30: 4 mg via INTRAVENOUS

## 2013-10-30 MED ORDER — BUPIVACAINE HCL 0.25 % IJ SOLN
INTRAMUSCULAR | Status: DC | PRN
  Administered 2013-10-30: 7 mL

## 2013-10-30 MED ORDER — PROMETHAZINE HCL 25 MG/ML IJ SOLN: 6.2500 mg | INTRAMUSCULAR | Status: DC | PRN

## 2013-10-30 MED ORDER — LIDOCAINE HCL (CARDIAC) 20 MG/ML IV SOLN
INTRAVENOUS | Status: DC | PRN
  Administered 2013-10-30: 50 mg via INTRAVENOUS

## 2013-10-30 MED ORDER — LACTATED RINGERS IV SOLN: INTRAVENOUS | Status: DC

## 2013-10-30 MED ORDER — PROPOFOL 10 MG/ML IV BOLUS
INTRAVENOUS | Status: DC | PRN
  Administered 2013-10-30: 200 mg via INTRAVENOUS

## 2013-10-30 MED ORDER — LABETALOL HCL 5 MG/ML IV SOLN
5.0000 mg | INTRAVENOUS | Status: DC | PRN
  Administered 2013-10-30: 5 mg via INTRAVENOUS
  Filled 2013-10-30: qty 4

## 2013-10-30 MED ORDER — LACTATED RINGERS IV SOLN
INTRAVENOUS | Status: DC
  Administered 2013-10-30: 1000 mL via INTRAVENOUS

## 2013-10-30 MED ORDER — BUPIVACAINE HCL (PF) 0.5 % IJ SOLN
INTRAMUSCULAR | Status: AC
  Filled 2013-10-30: qty 30

## 2013-10-30 MED ORDER — LABETALOL HCL 5 MG/ML IV SOLN
INTRAVENOUS | Status: AC
  Filled 2013-10-30: qty 4

## 2013-10-30 MED ORDER — BUPIVACAINE HCL 0.25 % IJ SOLN
INTRAMUSCULAR | Status: AC
  Filled 2013-10-30: qty 1

## 2013-10-30 MED ORDER — MIDAZOLAM HCL 2 MG/2ML IJ SOLN
INTRAMUSCULAR | Status: AC
  Filled 2013-10-30: qty 2

## 2013-10-30 MED ORDER — LACTATED RINGERS IV SOLN
INTRAVENOUS | Status: DC | PRN
  Administered 2013-10-30: 17:00:00 via INTRAVENOUS

## 2013-10-30 MED ORDER — SODIUM CHLORIDE 0.9 % IR SOLN
Status: DC | PRN
  Administered 2013-10-30 (×2): 3000 mL

## 2013-10-30 MED ORDER — FENTANYL CITRATE 0.05 MG/ML IJ SOLN
INTRAMUSCULAR | Status: DC | PRN
  Administered 2013-10-30 (×6): 25 ug via INTRAVENOUS

## 2013-10-30 MED ORDER — FENTANYL CITRATE 0.05 MG/ML IJ SOLN: 25.0000 ug | INTRAMUSCULAR | Status: DC | PRN

## 2013-10-30 MED ORDER — BACITRACIN-NEOMYCIN-POLYMYXIN 400-5-5000 EX OINT
TOPICAL_OINTMENT | CUTANEOUS | Status: AC
  Filled 2013-10-30: qty 1

## 2013-10-30 MED ORDER — MIDAZOLAM HCL 5 MG/5ML IJ SOLN
INTRAMUSCULAR | Status: DC | PRN
  Administered 2013-10-30: 2 mg via INTRAVENOUS

## 2013-10-30 SURGICAL SUPPLY — 38 items
BAG SPEC THK2 15X12 ZIP CLS (MISCELLANEOUS) ×1
BAG ZIPLOCK 12X15 (MISCELLANEOUS) ×2 IMPLANT
BANDAGE ELASTIC 4 VELCRO ST LF (GAUZE/BANDAGES/DRESSINGS) ×2 IMPLANT
BANDAGE ESMARK 6X9 LF (GAUZE/BANDAGES/DRESSINGS) ×1 IMPLANT
BANDAGE GAUZE ELAST BULKY 4 IN (GAUZE/BANDAGES/DRESSINGS) ×2 IMPLANT
BNDG CMPR 9X6 STRL LF SNTH (GAUZE/BANDAGES/DRESSINGS)
BNDG COHESIVE 4X5 TAN STRL (GAUZE/BANDAGES/DRESSINGS) ×1 IMPLANT
BNDG ESMARK 6X9 LF (GAUZE/BANDAGES/DRESSINGS)
CANISTER SUCTION 2500CC (MISCELLANEOUS) ×2 IMPLANT
CORDS BIPOLAR (ELECTRODE) ×2 IMPLANT
CUFF TOURN SGL QUICK 18 (TOURNIQUET CUFF) ×1 IMPLANT
CUFF TOURN SGL QUICK 24 (TOURNIQUET CUFF) ×2
CUFF TRNQT CYL 24X4X40X1 (TOURNIQUET CUFF) IMPLANT
DRAIN PENROSE 18X1/2 LTX STRL (DRAIN) ×1 IMPLANT
DRAPE SURG 17X11 SM STRL (DRAPES) ×1 IMPLANT
DRSG PAD ABDOMINAL 8X10 ST (GAUZE/BANDAGES/DRESSINGS) ×1 IMPLANT
ELECT REM PT RETURN 9FT ADLT (ELECTROSURGICAL) ×2
ELECTRODE REM PT RTRN 9FT ADLT (ELECTROSURGICAL) ×1 IMPLANT
EVACUATOR 1/8 PVC DRAIN (DRAIN) ×1 IMPLANT
GAUZE PACKING IODOFORM 1/2 (PACKING) ×1 IMPLANT
GAUZE PACKING IODOFORM 1/4X5 (PACKING) ×2 IMPLANT
GAUZE XEROFORM 5X9 LF (GAUZE/BANDAGES/DRESSINGS) ×1 IMPLANT
GOWN PREVENTION PLUS LG XLONG (DISPOSABLE) ×2 IMPLANT
HANDPIECE INTERPULSE COAX TIP (DISPOSABLE)
HOVERMATT SINGLE USE (MISCELLANEOUS) ×1 IMPLANT
IV NS IRRIG 3000ML ARTHROMATIC (IV SOLUTION) ×1 IMPLANT
KIT BASIN OR (CUSTOM PROCEDURE TRAY) ×2 IMPLANT
MANIFOLD NEPTUNE II (INSTRUMENTS) ×1 IMPLANT
PACK LOWER EXTREMITY WL (CUSTOM PROCEDURE TRAY) ×2 IMPLANT
PAD CAST 4YDX4 CTTN HI CHSV (CAST SUPPLIES) ×1 IMPLANT
PADDING CAST COTTON 4X4 STRL (CAST SUPPLIES) ×2
POSITIONER SURGICAL ARM (MISCELLANEOUS) ×2 IMPLANT
SET CYSTO W/LG BORE CLAMP LF (SET/KITS/TRAYS/PACK) ×1 IMPLANT
SET HNDPC FAN SPRY TIP SCT (DISPOSABLE) ×1 IMPLANT
SPONGE GAUZE 4X4 12PLY (GAUZE/BANDAGES/DRESSINGS) ×2 IMPLANT
SUT ETHILON 4 0 PS 2 18 (SUTURE) ×1 IMPLANT
SYR CONTROL 10ML LL (SYRINGE) ×2 IMPLANT
TOWEL OR 17X26 10 PK STRL BLUE (TOWEL DISPOSABLE) ×2 IMPLANT

## 2013-10-30 NOTE — Anesthesia Postprocedure Evaluation (Signed)
  Anesthesia Post-op Note  Patient: Joshua [name]  Procedure(s) Performed: Procedure(s) (LRB): IRRIGATION AND DEBRIDEMENT RIGHT SMALL FINGER POSSIBLE SECONDARY WOUND CLOSURE (Right)  Patient Location: PACU  Anesthesia Type: General  Level of Consciousness: awake and alert   Airway and Oxygen Therapy: Patient Spontanous Breathing  Post-op Pain: mild  Post-op Assessment: Post-op Vital signs reviewed, Patient's Cardiovascular Status Stable, Respiratory Function Stable, Patent Airway and No signs of Nausea or vomiting  Last Vitals:  Filed Vitals:   10/30/13 1858  BP: 179/92  Pulse: 69  Temp: 36.8 C  Resp:     Post-op Vital Signs: stable   Complications: No apparent anesthesia complications

## 2013-10-30 NOTE — Progress Notes (Signed)
Patient underwent repeat I and D today with repeat cultures  Had extension into dorsal extensor sheath  Will need repeat procedure this Thursday of Friday with possible amputation at pipj level

## 2013-10-30 NOTE — Anesthesia Preprocedure Evaluation (Signed)
Anesthesia Evaluation  Patient identified by MRN, date of birth, ID band Patient awake    Reviewed: Allergy & Precautions, H&P , NPO status , Patient's Chart, lab work & pertinent test results  Airway Mallampati: II TM Distance: >3 FB Neck ROM: Full    Dental no notable dental hx.    Pulmonary neg pulmonary ROS,  breath sounds clear to auscultation  Pulmonary exam normal       Cardiovascular hypertension, Pt. on medications and Pt. on home beta blockers + Past MI negative cardio ROS  Rhythm:Regular Rate:Normal     Neuro/Psych  Neuromuscular disease negative psych ROS   GI/Hepatic Neg liver ROS, GERD-  Medicated,  Endo/Other  diabetes, Type 2, Oral Hypoglycemic AgentsMorbid obesity  Renal/GU negative Renal ROS  negative genitourinary   Musculoskeletal negative musculoskeletal ROS (+)   Abdominal (+) + obese,   Peds negative pediatric ROS (+)  Hematology  (+) Blood dyscrasia, ,   Anesthesia Other Findings   Reproductive/Obstetrics negative OB ROS                           Anesthesia Physical Anesthesia Plan  ASA: III  Anesthesia Plan: General   Post-op Pain Management:    Induction: Intravenous  Airway Management Planned: LMA  Additional Equipment:   Intra-op Plan:   Post-operative Plan: Extubation in OR  Informed Consent: I have reviewed the patients History and Physical, chart, labs and discussed the procedure including the risks, benefits and alternatives for the proposed anesthesia with the patient or authorized representative who has indicated his/her understanding and acceptance.   Dental advisory given  Plan Discussed with: CRNA  Anesthesia Plan Comments:         Anesthesia Quick Evaluation

## 2013-10-30 NOTE — Interval H&P Note (Signed)
History and Physical Interval Note:  10/30/2013 7:15 AM  Joshua [name]  has presented today for surgery, with the diagnosis of right small finger infection  The various methods of treatment have been discussed with the patient and family. After consideration of risks, benefits and other options for treatment, the patient has consented to  Procedure(s) with comments: [place] (Right) - Joshua [name] is available after 4pm as a surgical intervention .  The patient's history has been reviewed, patient examined, no change in status, stable for surgery.  I have reviewed the patient's chart and labs.  Questions were answered to the patient's satisfaction.     Charlotte Crumb A

## 2013-10-30 NOTE — Transfer of Care (Signed)
Immediate Anesthesia Transfer of Care Note  Patient: Joshua [name]  Procedure(s) Performed: Procedure(s) (LRB): IRRIGATION AND DEBRIDEMENT RIGHT SMALL FINGER POSSIBLE SECONDARY WOUND CLOSURE (Right)  Patient Location: PACU  Anesthesia Type: General  Level of Consciousness: sedated, patient cooperative and responds to stimulation  Airway & Oxygen Therapy: Patient Spontanous Breathing and Patient connected to face mask oxgen  Post-op Assessment: Report given to PACU RN and Post -op Vital signs reviewed and stable  Post vital signs: Reviewed and stable  Complications: No apparent anesthesia complications

## 2013-10-30 NOTE — Progress Notes (Signed)
[facility] PROGRESS NOTE  Joshua [name] OQH:[id number] DOB: 1956/09/22 DOA: 10/28/2013 PCP: [name], MD  Assessment/Plan: 1. Right small finger infection - gram neg rods seen, started on amp/sulbactam IV, pt to go to OR later today for 2nd procedure 2. Uncontrolled diabetes mellitus as evidenced by an A1c of 16%.  Pt's glycemic control has improved considerably since starting basal bolus supplement insulin program.  Continue monitoring BS closely.  3. OSA - continuing CPAP therapy 4. Hypertension - resumed home meds 5. Dyslipidemia - will improve when diabetes is under better control, pt counseled.  6. Alcoholism - continue CIWA protocol 7. CAD s/p MI with stenting - continue plavix  8. Diabetic Gastroparesis - pt is NPO, may need to restart metoclopramide in near future  Code Status: Full Code Family Communication: wife at bedside  HPI/Subjective: Pt without complaints  Objective: Filed Vitals:   10/30/13 0640  BP: 155/80  Pulse: 67  Temp: 98 F (36.7 C)  Resp: 16    Intake/Output Summary (Last 24 hours) at 10/30/13 1157 Last data filed at 10/30/13 1000  Gross per 24 hour  Intake 2578.33 ml  Output   1600 ml  Net 978.33 ml   Filed Weights   10/29/13 0340  Weight: 282 lb (127.914 kg)    Exam:   General:  Awake, alert, no distress  Cardiovascular: normal s1, s2 sounds   Respiratory: BBS clear to auscultation  Abdomen: obese, soft, nondistended, nontender  Musculoskeletal: no cyanosis or clubbing  Data Reviewed: Basic Metabolic Panel:  Recent Labs Lab 10/28/13 2300 10/30/13 0400  NA 131* 137  K 4.4 3.5  CL 94* 100  CO2 24 26  GLUCOSE 529* 182*  BUN 22 15  CREATININE 1.21 1.11  CALCIUM 9.4 9.1   Liver Function Tests:  Recent Labs Lab 10/30/13 0400  AST 10  ALT 9  ALKPHOS 109  BILITOT 0.2*  PROT 6.9  ALBUMIN 2.5*   No results found for this basename: LIPASE, AMYLASE,  in the last 168 hours No results found for  this basename: AMMONIA,  in the last 168 hours CBC:  Recent Labs Lab 10/28/13 2300 10/30/13 0400  WBC 8.9 6.6  NEUTROABS  --  4.0  HGB 12.4* 11.8*  HCT 37.1* 36.4*  MCV 84.9 85.0  PLT 277 259   Cardiac Enzymes: No results found for this basename: CKTOTAL, CKMB, CKMBINDEX, TROPONINI,  in the last 168 hours BNP (last 3 results) No results found for this basename: PROBNP,  in the last 8760 hours CBG:  Recent Labs Lab 10/29/13 1316 10/29/13 1733 10/29/13 2153 10/30/13 0739 10/30/13 1153  GLUCAP 193* 153* 235* 149* 164*    Recent Results (from the past 240 hour(s))  GRAM STAIN     Status: None   Collection Time    10/29/13 12:42 AM      Result Value Range Status   Specimen Description WOUND RIGHT SMALL FINGER INFECTION   Final   Special Requests PATIENT ON FOLLOWING KEFLEX   Final   Gram Stain     Final   Value: FEW SQUAMOUS EPITHELIAL CELLS PRESENT     FEW WBC PRESENT, PREDOMINANTLY PMN     FEW GRAM NEGATIVE RODS     Gram Stain Report Called to,Read Back By and Verified With: DR.WEINGOLD AT 0205 ON 10/29/13 BY HOBBINS, J.   Report Status 10/29/2013 FINAL   Final  ANAEROBIC CULTURE     Status: None   Collection Time    10/29/13 12:42 AM  Result Value Range Status   Specimen Description WOUND RIGHT SMALL FINGER INFECTION   Final   Special Requests PATIENT ON FOLLOWING KEFLEX   Final   Gram Stain     Final   Value: NO WBC SEEN     MODERATE SQUAMOUS EPITHELIAL CELLS PRESENT     NO ORGANISMS SEEN     Performed at [facility]   Culture     Final   Value: NO ANAEROBES ISOLATED; CULTURE IN PROGRESS FOR 5 DAYS     Performed at [facility]   Report Status PENDING   Incomplete  WOUND CULTURE     Status: None   Collection Time    10/29/13 12:42 AM      Result Value Range Status   Specimen Description WOUND RIGHT SMALL FINGER INFECTION   Final   Special Requests PATIENT ON FOLLOWING KEFLEX   Final   Gram Stain     Final   Value: NO WBC SEEN      MODERATE SQUAMOUS EPITHELIAL CELLS PRESENT     RARE GRAM NEGATIVE RODS     Performed at [facility]   Culture     Final   Value: ABUNDANT GRAM NEGATIVE RODS     Performed at [facility]   Report Status PENDING   Incomplete  GRAM STAIN     Status: None   Collection Time    10/29/13 12:48 AM      Result Value Range Status   Specimen Description WOUND RIGHT SMALL FINGER DEEP   Final   Special Requests PATIENT ON FOLLOWING KEFLEX   Final   Gram Stain     Final   Value: RARE SQUAMOUS EPITHELIAL CELLS PRESENT     RARE WBC PRESENT, PREDOMINANTLY PMN     RARE GRAM NEGATIVE RODS     Gram Stain Report Called to,Read Back By and Verified With: DR. [name] AT 0205 ON 10/29/13 BY HOBBINS, J.   Report Status 10/29/2013 FINAL   Final  ANAEROBIC CULTURE     Status: None   Collection Time    10/29/13 12:48 AM      Result Value Range Status   Specimen Description WOUND RIGHT SMALL FINGER DEEP   Final   Special Requests PATIENT ON FOLLOWING KEFLEX   Final   Gram Stain     Final   Value: FEW WBC PRESENT,BOTH PMN AND MONONUCLEAR     NO SQUAMOUS EPITHELIAL CELLS SEEN     NO ORGANISMS SEEN     Performed at [facility]   Culture     Final   Value: NO ANAEROBES ISOLATED; CULTURE IN PROGRESS FOR 5 DAYS     Performed at [facility]   Report Status PENDING   Incomplete  WOUND CULTURE     Status: None   Collection Time    10/29/13 12:48 AM      Result Value Range Status   Specimen Description WOUND RIGHT SMALL FINGER DEEP   Final   Special Requests PATIENT ON FOLLOWING KEFLEX   Final   Gram Stain     Final   Value: NO WBC SEEN     NO SQUAMOUS EPITHELIAL CELLS SEEN     NO ORGANISMS SEEN     Performed at [facility]   Culture     Final   Value: GRAM NEGATIVE RODS     Performed at [facility]   Report Status PENDING   Incomplete  Studies: Dg Chest 2 View  10/28/2013   CLINICAL DATA:  Shortness of breath  EXAM: CHEST  2 VIEW  COMPARISON:   10/31/2011  FINDINGS: Mild cardiomegaly, stable from prior. Kerley A and B lines noted. No significant effusion. No asymmetric airspace opacity. No acute osseous findings.  IMPRESSION: Mild interstitial pulmonary edema.   Electronically Signed   By: Jorje Guild M.D.   On: 10/28/2013 23:01   Dg Hand Complete Right  10/28/2013   CLINICAL DATA:  Little finger laceration with pain  EXAM: RIGHT HAND - COMPLETE 3+ VIEW  COMPARISON:  None.  FINDINGS: There is extensive soft tissue swelling of the 5th digit, extending into the dorsal hand. No radiodense foreign body. No osseous erosion or asymmetric joint narrowing to suggest osseous/joint infection. No acute fracture or malalignment.  Remote ulnar and radial styloid process fractures.  Degenerative interphalangeal joint narrowing throughout the digits.  IMPRESSION: No evidence of osseous infection.  No radiodense foreign body.   Electronically Signed   By: Jorje Guild M.D.   On: 10/28/2013 23:04    Scheduled Meds: . allopurinol  300 mg Oral Daily  . ampicillin-sulbactam (UNASYN) IV  1.5 g Intravenous Q6H  . clopidogrel  75 mg Oral Daily  . docusate sodium  100 mg Oral BID  . folic acid  1 mg Oral Daily  . hydrochlorothiazide  12.5 mg Oral Daily  . insulin aspart  0-15 Units Subcutaneous TID WC  . insulin aspart  0-5 Units Subcutaneous QHS  . insulin aspart  10 Units Subcutaneous TID WC  . insulin glargine  30 Units Subcutaneous Daily  . LORazepam  0-4 mg Intravenous Q6H   Followed by  . [START ON 10/31/2013] LORazepam  0-4 mg Intravenous Q12H  . metoprolol succinate  100 mg Oral Daily  . multivitamin with minerals  1 tablet Oral Daily  . thiamine  100 mg Oral Daily   Or  . thiamine  100 mg Intravenous Daily   Continuous Infusions: . sodium chloride 50 mL/hr at 10/29/13 1937    Active Problems:   DIABETES MELLITUS, TYPE II   HYPERLIPIDEMIA   HYPERTENSION   GERD   Presence of stent in anterior descending branch of left coronary  artery   Abscess of fifth finger   Uncontrolled diabetes mellitus with peripheral autonomic neuropathy   Uncontrolled diabetes mellitus   Hyperglycemia   Arley Salamone [employer] [phone]. If 7PM-7AM, please contact night-coverage at [email or web address], password [email or web address] 10/30/2013, 11:57 AM  LOS: 2 days

## 2013-10-30 NOTE — Progress Notes (Signed)
Offered pt assistance with cpap, but he refused at this time.  Pt stated he didn't need help, and will place it on himself later if wanted.  Pt was advised that RT is available all night should he need further assistance.  Cpap noted on autotitration mode 6-20cm h2o.

## 2013-10-30 NOTE — H&P (View-Only) (Signed)
Reason for Consult:right small finger swelling Referring Physician: ER  Joshua [name] is an 57 y.o. male.  HPI: as above s/p laceration to volar aspect of right small finger several days ago with worsening pain and swelling despite po abx  Past Medical History  Diagnosis Date  . Diabetes mellitus   . Hypertension   . Hyperlipemia     Past Surgical History  Procedure Laterality Date  . Breast lumpectomy      History reviewed. No pertinent family history.  Social History:  reports that he has never smoked. He has never used smokeless tobacco. He reports that he drinks about 10.8 ounces of alcohol per week. He reports that he does not use illicit drugs.  Allergies: No Known Allergies  Medications: Prior to Admission:  (Not in a hospital admission)  No results found for this or any previous visit (from the past 48 hour(s)).  Dg Chest 2 View  10/28/2013   CLINICAL DATA:  Shortness of breath  EXAM: CHEST  2 VIEW  COMPARISON:  10/31/2011  FINDINGS: Mild cardiomegaly, stable from prior. Kerley A and B lines noted. No significant effusion. No asymmetric airspace opacity. No acute osseous findings.  IMPRESSION: Mild interstitial pulmonary edema.   Electronically Signed   By: Jorje Guild M.D.   On: 10/28/2013 23:01   Dg Hand Complete Right  10/28/2013   CLINICAL DATA:  Little finger laceration with pain  EXAM: RIGHT HAND - COMPLETE 3+ VIEW  COMPARISON:  None.  FINDINGS: There is extensive soft tissue swelling of the 5th digit, extending into the dorsal hand. No radiodense foreign body. No osseous erosion or asymmetric joint narrowing to suggest osseous/joint infection. No acute fracture or malalignment.  Remote ulnar and radial styloid process fractures.  Degenerative interphalangeal joint narrowing throughout the digits.  IMPRESSION: No evidence of osseous infection.  No radiodense foreign body.   Electronically Signed   By: Jorje Guild M.D.   On: 10/28/2013 23:04    Review of  Systems  Constitutional: Positive for fever.   Blood pressure 151/87, pulse 85, temperature 97.9 F (36.6 C), temperature source Oral, resp. rate 19, SpO2 99.00%. Physical Exam  Constitutional: He is oriented to person, place, and time. He appears well-developed and well-nourished.  HENT:  Head: Normocephalic and atraumatic.  Cardiovascular: Normal rate.   Respiratory: Effort normal.  Musculoskeletal:       Right hand: He exhibits tenderness, laceration and swelling.  Right small finger dactilitis with volar pain and swelling  Neurological: He is alert and oriented to person, place, and time.  Skin: Skin is warm. There is erythema.  Psychiatric: He has a normal mood and affect. His behavior is normal. Judgment and thought content normal.    Assessment/Plan: As above  Plan admission to medicine and OR now for I and D  Annamay Laymon A 10/28/2013, 11:34 PM

## 2013-10-30 NOTE — Op Note (Signed)
See note [id number]

## 2013-10-31 ENCOUNTER — Encounter (HOSPITAL_COMMUNITY): Payer: Self-pay | Admitting: Orthopedic Surgery

## 2013-10-31 ENCOUNTER — Other Ambulatory Visit: Payer: Self-pay | Admitting: Orthopedic Surgery

## 2013-10-31 DIAGNOSIS — I10 Essential (primary) hypertension: Secondary | ICD-10-CM

## 2013-10-31 DIAGNOSIS — R7309 Other abnormal glucose: Secondary | ICD-10-CM

## 2013-10-31 DIAGNOSIS — E119 Type 2 diabetes mellitus without complications: Secondary | ICD-10-CM

## 2013-10-31 LAB — GLUCOSE, CAPILLARY
Glucose-Capillary: 237 mg/dL — ABNORMAL HIGH (ref 70–99)
Glucose-Capillary: 254 mg/dL — ABNORMAL HIGH (ref 70–99)
Glucose-Capillary: 228 mg/dL — ABNORMAL HIGH (ref 70–99)
Glucose-Capillary: 144 mg/dL — ABNORMAL HIGH (ref 70–99)
Glucose-Capillary: 260 mg/dL — ABNORMAL HIGH (ref 70–99)

## 2013-10-31 LAB — WOUND CULTURE
Gram Stain: NONE SEEN
Gram Stain: NONE SEEN

## 2013-10-31 MED ORDER — POLYETHYLENE GLYCOL 3350 17 G PO PACK
17.0000 g | PACK | Freq: Every day | ORAL | Status: DC
  Administered 2013-10-31 – 2013-11-02 (×3): 17 g via ORAL

## 2013-10-31 MED ORDER — OXYCODONE-ACETAMINOPHEN 5-325 MG PO TABS
1.0000 | ORAL_TABLET | ORAL | Status: DC | PRN
Start: 2013-10-31 — End: 2013-11-02
  Administered 2013-10-31 – 2013-11-02 (×7): 2 via ORAL
  Filled 2013-10-31 (×7): qty 2

## 2013-10-31 MED ORDER — BISACODYL 10 MG RE SUPP: 10.0000 mg | Freq: Every day | RECTAL | Status: DC | PRN

## 2013-10-31 MED ORDER — SENNA 8.6 MG PO TABS
2.0000 | ORAL_TABLET | Freq: Every day | ORAL | Status: DC
  Administered 2013-10-31 – 2013-11-01 (×2): 17.2 mg via ORAL
  Filled 2013-10-31 (×2): qty 2

## 2013-10-31 MED ORDER — INSULIN GLARGINE 100 UNIT/ML ~~LOC~~ SOLN
30.0000 [IU] | Freq: Every day | SUBCUTANEOUS | Status: DC
  Administered 2013-10-31 – 2013-11-01 (×2): 30 [IU] via SUBCUTANEOUS
  Filled 2013-10-31 (×2): qty 0.3

## 2013-10-31 MED ORDER — HYDROMORPHONE HCL PF 1 MG/ML IJ SOLN: 1.0000 mg | INTRAMUSCULAR | Status: DC | PRN

## 2013-10-31 NOTE — Progress Notes (Signed)
Inpatient Diabetes Program Recommendations  AACE/ADA: New Consensus Statement on Inpatient Glycemic Control (2013)  Target Ranges:  Prepandial:   less than 140 mg/dL      Peak postprandial:   less than 180 mg/dL (1-2 hours)      Critically ill patients:  140 - 180 mg/dL   Reason for Visit: Hyperglycemia  Results for BRIAN, ZEITLIN (MRN [id number]) as of 10/31/2013 16:43  Ref. Range 10/09/2013 12:15  Hemoglobin A1C Latest Range: <5.7 % 16.2 (H)  Results for HILBERTO, BURZYNSKI (MRN [id number]) as of 10/31/2013 16:43  Ref. Range 10/30/2013 22:10 10/31/2013 07:21 10/31/2013 12:01  Glucose-Capillary Latest Range: 70-99 mg/dL 237 (H) 254 (H) 228 (H)    Inpatient Diabetes Program Recommendations Insulin - Basal: Increase Lantus to 45 units QHS Insulin - Meal Coverage: Received first meal coverage insulin today at lunch, titrate daily if CBGs >180 mg/dL HgbA1C: 16.2% uncontrolled -  Outpatient Referral: Will order OP Diabetes Education consult  Note: Will follow daily Thank you. Lorenda Peck, RD, LDN, CDE Inpatient Diabetes Coordinator [phone]

## 2013-10-31 NOTE — Op Note (Signed)
NAMESAMEER, [name] NO.:  [id number]  MEDICAL RECORD NO.:  [id number]  LOCATION:  0321                         FACILITY:  [facility]  PHYSICIAN:  Sheral Apley. Aerabella Galasso, M.D.DATE OF BIRTH:  [birth year]  DATE OF PROCEDURE:  10/30/2013 DATE OF DISCHARGE:                              OPERATIVE REPORT   PREOPERATIVE DIAGNOSIS:  Severe infection, right small finger, involving flexor and extensor sheaths.  POSTOPERATIVE DIAGNOSIS:  Severe infection, right small finger, involving flexor and extensor sheaths.  PROCEDURES:  Repeat incision and drainage with cultures x2, both dorsally and volarly.  SURGEON:  Sheral Apley. [name], M.D.  ASSISTANT:  None.  ANESTHESIA:  General.  COMPLICATION:  No complication.  DRAINS:  No drains.  The wounds were packed open with quarter-inch Iodoform gauze.  Patient was taken to the operating suite.  After induction of adequate general anesthesia, we removed the packing from the previous incision and drainage.  The deep wound volarly was cultured and sent for stat Gram stain, cultures, etc.  We then used 6 L normal saline using cysto tubing to irrigate the wound thoroughly.  We then fully pronated the hand. There was some fluctuance over the dorsal aspect of the extensor mechanism which was not there from his previous incision and drainage. We opened this on the dorsal aspect.  Purulence was encountered.  It was cultured.  We thoroughly irrigated this with another liter of normal saline.  We then packed both wounds open with quarter-inch Iodoform gauze, and then placed the patient in sterile dressing of 4x4s fluffs and a compression wrap.  The patient tolerated the procedure well, went to the recovery room in stable fashion.     Sheral Apley [name], M.D.     MAW/MEDQ  D:  10/30/2013  T:  10/31/2013  Job:  [id number]

## 2013-10-31 NOTE — Progress Notes (Signed)
[facility] PROGRESS NOTE  Joshua [name] DVV:[id number] DOB: 06-29-1956 DOA: 10/28/2013 PCP: [name], MD  Assessment/Plan  1. Severe infection of the right small finger including the flexor and extensor sheaths with Klebsiella.   - Sensitive to unsyn per initial culture data -  F/u subsequent culture data -  If pt spikes fever or worsens, consider changing to ceftriaxone -  Per patient, may need additional washouts later this week -  Appreciate orthopedics assistance 2. Uncontrolled diabetes mellitus as evidenced by an A1c of 16%. CBG elevated this AM.  Patient eating well and normally receives ~60 units of long-acting insulin daily per home regimen.  Currently only receiving 30 units -  Add lantus 30 units QHS (total of 30 units BID) -  Continue SSI with meals  3. OSA, stable. Continue CPAP  4. Hypertension, BP mildly elevated but trending down as infection and pain are treated.  Continue home meds and control pain 5. Dyslipidemia - will improve when diabetes is under better control, pt counseled.  6. Alcoholism - CIWA scores are 0.  Continue ativan prn elevated CIWA scores 7. CAD s/p MI with stenting - continue plavix  8. Diabetic Gastroparesis, stable.  Tolerating food.  May add reglan if needed 9. Constipation due to pain medication.  Continue colace.  Add senna, miralax, and prn bisacodyl  Diet:  diabetic Access:  PIV IVF:  yes Proph:  SCDs  Code Status: full Family Communication: patient alone Disposition Plan: pending improvement in hand infection, clearance by hand surgeons.     Consultants:  Hand surgery, Dr. Marzetta Merino  Procedures:  I&D 11/18 of right hand 5th finger  Antibiotics:  Unasyn 11/17  HPI/Subjective:  Still having pain in the right hand 5th finger.  Is getting constipated.  Eating better, drinking fluids.  Denies chest pain, SOB, fevers, chills.    Objective: Filed Vitals:   10/30/13 2329 10/31/13 0144 10/31/13 0545  10/31/13 1000  BP:  156/87 134/76 146/82  Pulse: 73 71 69 69  Temp:  99.4 F (37.4 C) 99.9 F (37.7 C) 99.1 F (37.3 C)  TempSrc:  Oral Oral Oral  Resp:  $Remo'16 14 16  'LCwiJ$ Height:      Weight:      SpO2:  98% 96% 98%    Intake/Output Summary (Last 24 hours) at 10/31/13 1135 Last data filed at 10/31/13 1038  Gross per 24 hour  Intake   1690 ml  Output   1800 ml  Net   -110 ml   Filed Weights   10/29/13 0340  Weight: 127.914 kg (282 lb)    Exam:   General:  AAM, No acute distress  HEENT:  NCAT, MMM  Cardiovascular:  RRR, nl S1, S2 no mrg, 2+ pulses, warm extremities  Respiratory:  CTAB, no increased WOB  Abdomen:   NABS, soft, NT/ND  MSK:   Normal tone and bulk, no LEE.  Right hand wrapped in kerlix, little finger not visible.  Able to wiggle fingers, warm, well-perfused, 2+ radial pulse.    Neuro:  Grossly intact  Data Reviewed: Basic Metabolic Panel:  Recent Labs Lab 10/28/13 2300 10/30/13 0400  NA 131* 137  K 4.4 3.5  CL 94* 100  CO2 24 26  GLUCOSE 529* 182*  BUN 22 15  CREATININE 1.21 1.11  CALCIUM 9.4 9.1   Liver Function Tests:  Recent Labs Lab 10/30/13 0400  AST 10  ALT 9  ALKPHOS 109  BILITOT 0.2*  PROT 6.9  ALBUMIN 2.5*  No results found for this basename: LIPASE, AMYLASE,  in the last 168 hours No results found for this basename: AMMONIA,  in the last 168 hours CBC:  Recent Labs Lab 10/28/13 2300 10/30/13 0400  WBC 8.9 6.6  NEUTROABS  --  4.0  HGB 12.4* 11.8*  HCT 37.1* 36.4*  MCV 84.9 85.0  PLT 277 259   Cardiac Enzymes: No results found for this basename: CKTOTAL, CKMB, CKMBINDEX, TROPONINI,  in the last 168 hours BNP (last 3 results) No results found for this basename: PROBNP,  in the last 8760 hours CBG:  Recent Labs Lab 10/30/13 0739 10/30/13 1153 10/30/13 1816 10/30/13 2210 10/31/13 0721  GLUCAP 149* 164* 118* 237* 254*    Recent Results (from the past 240 hour(s))  GRAM STAIN     Status: None   Collection  Time    10/29/13 12:42 AM      Result Value Range Status   Specimen Description WOUND RIGHT SMALL FINGER INFECTION   Final   Special Requests PATIENT ON FOLLOWING KEFLEX   Final   Gram Stain     Final   Value: FEW SQUAMOUS EPITHELIAL CELLS PRESENT     FEW WBC PRESENT, PREDOMINANTLY PMN     FEW GRAM NEGATIVE RODS     Gram Stain Report Called to,Read Back By and Verified With: DR.WEINGOLD AT 0205 ON 10/29/13 BY HOBBINS, J.   Report Status 10/29/2013 FINAL   Final  ANAEROBIC CULTURE     Status: None   Collection Time    10/29/13 12:42 AM      Result Value Range Status   Specimen Description WOUND RIGHT SMALL FINGER INFECTION   Final   Special Requests PATIENT ON FOLLOWING KEFLEX   Final   Gram Stain     Final   Value: NO WBC SEEN     MODERATE SQUAMOUS EPITHELIAL CELLS PRESENT     NO ORGANISMS SEEN     Performed at [facility]   Culture     Final   Value: NO ANAEROBES ISOLATED; CULTURE IN PROGRESS FOR 5 DAYS     Performed at [facility]   Report Status PENDING   Incomplete  WOUND CULTURE     Status: None   Collection Time    10/29/13 12:42 AM      Result Value Range Status   Specimen Description WOUND RIGHT SMALL FINGER INFECTION   Final   Special Requests PATIENT ON FOLLOWING KEFLEX   Final   Gram Stain     Final   Value: NO WBC SEEN     MODERATE SQUAMOUS EPITHELIAL CELLS PRESENT     RARE GRAM NEGATIVE RODS     Performed at [facility]   Culture     Final   Value: ABUNDANT KLEBSIELLA PNEUMONIAE     Performed at [facility]   Report Status 10/31/2013 FINAL   Final   Organism ID, Bacteria KLEBSIELLA PNEUMONIAE   Final  GRAM STAIN     Status: None   Collection Time    10/29/13 12:48 AM      Result Value Range Status   Specimen Description WOUND RIGHT SMALL FINGER DEEP   Final   Special Requests PATIENT ON FOLLOWING KEFLEX   Final   Gram Stain     Final   Value: RARE SQUAMOUS EPITHELIAL CELLS PRESENT     RARE WBC PRESENT, PREDOMINANTLY  PMN     RARE GRAM NEGATIVE RODS  Gram Stain Report Called to,Read Back By and Verified With: DR. [name] AT 0205 ON 10/29/13 BY HOBBINS, J.   Report Status 10/29/2013 FINAL   Final  ANAEROBIC CULTURE     Status: None   Collection Time    10/29/13 12:48 AM      Result Value Range Status   Specimen Description WOUND RIGHT SMALL FINGER DEEP   Final   Special Requests PATIENT ON FOLLOWING KEFLEX   Final   Gram Stain     Final   Value: FEW WBC PRESENT,BOTH PMN AND MONONUCLEAR     NO SQUAMOUS EPITHELIAL CELLS SEEN     NO ORGANISMS SEEN     Performed at [facility]   Culture     Final   Value: NO ANAEROBES ISOLATED; CULTURE IN PROGRESS FOR 5 DAYS     Performed at [facility]   Report Status PENDING   Incomplete  WOUND CULTURE     Status: None   Collection Time    10/29/13 12:48 AM      Result Value Range Status   Specimen Description WOUND RIGHT SMALL FINGER DEEP   Final   Special Requests PATIENT ON FOLLOWING KEFLEX   Final   Gram Stain     Final   Value: NO WBC SEEN     NO SQUAMOUS EPITHELIAL CELLS SEEN     NO ORGANISMS SEEN     Performed at [facility]   Culture     Final   Value: KLEBSIELLA PNEUMONIAE     Performed at [facility]   Report Status 10/31/2013 FINAL   Final   Organism ID, Bacteria KLEBSIELLA PNEUMONIAE   Final  GRAM STAIN     Status: None   Collection Time    10/30/13  5:21 PM      Result Value Range Status   Specimen Description FINGER SMALL RIGHT   Final   Special Requests NONE   Final   Gram Stain     Final   Value: RARE WBC PRESENT, PREDOMINANTLY MONONUCLEAR     NO ORGANISMS SEEN     Gram Stain Report Called to,Read Back By and Verified With: DR. Ander Slade AT 8315 ON 11.18.14 BY LOVE,T.   Report Status 10/30/2013 FINAL   Final  ANAEROBIC CULTURE     Status: None   Collection Time    10/30/13  5:21 PM      Result Value Range Status   Specimen Description FINGER SMALL RIGHT   Final   Special Requests NONE   Final    Gram Stain     Final   Value: RARE WBC PRESENT, PREDOMINANTLY PMN     NO SQUAMOUS EPITHELIAL CELLS SEEN     NO ORGANISMS SEEN     Performed at [facility]   Culture     Final   Value: NO ANAEROBES ISOLATED; CULTURE IN PROGRESS FOR 5 DAYS     Performed at [facility]   Report Status PENDING   Incomplete  CULTURE, ROUTINE-ABSCESS     Status: None   Collection Time    10/30/13  5:21 PM      Result Value Range Status   Specimen Description FINGER SMALL RIGHT   Final   Special Requests NONE   Final   Gram Stain     Final   Value: RARE WBC PRESENT, PREDOMINANTLY PMN     NO SQUAMOUS EPITHELIAL CELLS SEEN     NO ORGANISMS SEEN  Performed at [facility] PENDING   Incomplete   Report Status PENDING   Incomplete  GRAM STAIN     Status: None   Collection Time    10/30/13  5:49 PM      Result Value Range Status   Specimen Description ABSCESS RIGHT 5TH FINGER DORSAL   Final   Special Requests NONE   Final   Gram Stain     Final   Value: FEW WBC PRESENT,BOTH PMN AND MONONUCLEAR     NO ORGANISMS SEEN     Gram Stain Report Called to,Read Back By and Verified With: PLATT,J. AT 5573 ON 11.18.14 BY LOVE,T.   Report Status 10/30/2013 FINAL   Final  ANAEROBIC CULTURE     Status: None   Collection Time    10/30/13  5:49 PM      Result Value Range Status   Specimen Description ABSCESS RIGHT 5TH FINGER DORSAL   Final   Special Requests NONE   Final   Gram Stain     Final   Value: FEW WBC PRESENT, PREDOMINANTLY PMN     NO SQUAMOUS EPITHELIAL CELLS SEEN     NO ORGANISMS SEEN     Performed at [facility]   Culture     Final   Value: NO ANAEROBES ISOLATED; CULTURE IN PROGRESS FOR 5 DAYS     Performed at [facility]   Report Status PENDING   Incomplete  CULTURE, ROUTINE-ABSCESS     Status: None   Collection Time    10/30/13  5:49 PM      Result Value Range Status   Specimen Description ABSCESS RIGHT 5TH FINGER DORSAL   Final   Special  Requests NONE   Final   Gram Stain     Final   Value: FEW WBC PRESENT, PREDOMINANTLY PMN     NO SQUAMOUS EPITHELIAL CELLS SEEN     NO ORGANISMS SEEN     Performed at [facility]   Culture PENDING   Incomplete   Report Status PENDING   Incomplete     Studies: No results found.  Scheduled Meds: . allopurinol  300 mg Oral Daily  . ampicillin-sulbactam (UNASYN) IV  1.5 g Intravenous Q6H  . clopidogrel  75 mg Oral Daily  . docusate sodium  100 mg Oral BID  . folic acid  1 mg Oral Daily  . hydrochlorothiazide  12.5 mg Oral Daily  . insulin aspart  0-15 Units Subcutaneous TID WC  . insulin aspart  0-5 Units Subcutaneous QHS  . insulin aspart  10 Units Subcutaneous TID WC  . insulin glargine  30 Units Subcutaneous Daily  . LORazepam  0-4 mg Intravenous Q12H  . metoprolol succinate  100 mg Oral Daily  . multivitamin with minerals  1 tablet Oral Daily  . thiamine  100 mg Oral Daily   Or  . thiamine  100 mg Intravenous Daily   Continuous Infusions: . sodium chloride Stopped (10/30/13 1635)  . lactated ringers      Active Problems:   DIABETES MELLITUS, TYPE II   HYPERLIPIDEMIA   HYPERTENSION   GERD   Presence of stent in anterior descending branch of left coronary artery   Abscess of fifth finger   Uncontrolled diabetes mellitus with peripheral autonomic neuropathy   Uncontrolled diabetes mellitus   Hyperglycemia    Time spent: 30 min    Jacarri Gesner, [facility] Pager [phone]. If 7PM-7AM, please contact night-coverage at  [email or web address], password [facility] 10/31/2013, 11:35 AM  LOS: 3 days

## 2013-11-01 ENCOUNTER — Encounter (HOSPITAL_COMMUNITY): Payer: Worker's Compensation | Admitting: Anesthesiology

## 2013-11-01 ENCOUNTER — Encounter (HOSPITAL_COMMUNITY)
Admission: EM | Disposition: A | Discharge status: Home / Self Care | Payer: Self-pay | Source: Home / Self Care | Attending: Internal Medicine

## 2013-11-01 ENCOUNTER — Encounter (HOSPITAL_COMMUNITY): Payer: Self-pay | Admitting: Certified Registered Nurse Anesthetist

## 2013-11-01 ENCOUNTER — Inpatient Hospital Stay (HOSPITAL_COMMUNITY): Payer: Worker's Compensation | Admitting: Anesthesiology

## 2013-11-01 HISTORY — PX: I&D EXTREMITY: SHX5045

## 2013-11-01 LAB — GLUCOSE, CAPILLARY
Glucose-Capillary: 232 mg/dL — ABNORMAL HIGH (ref 70–99)
Glucose-Capillary: 148 mg/dL — ABNORMAL HIGH (ref 70–99)
Glucose-Capillary: 158 mg/dL — ABNORMAL HIGH (ref 70–99)
Glucose-Capillary: 158 mg/dL — ABNORMAL HIGH (ref 70–99)
Glucose-Capillary: 316 mg/dL — ABNORMAL HIGH (ref 70–99)

## 2013-11-01 SURGERY — IRRIGATION AND DEBRIDEMENT EXTREMITY
Anesthesia: General | Site: Finger | Laterality: Right | Wound class: Dirty or Infected

## 2013-11-01 MED ORDER — LABETALOL HCL 5 MG/ML IV SOLN
10.0000 mg | INTRAVENOUS | Status: DC | PRN
  Filled 2013-11-01: qty 4

## 2013-11-01 MED ORDER — ONDANSETRON HCL 4 MG/2ML IJ SOLN
INTRAMUSCULAR | Status: AC
  Filled 2013-11-01: qty 2

## 2013-11-01 MED ORDER — MAGNESIUM CITRATE PO SOLN
1.0000 | Freq: Once | ORAL | Status: AC
  Administered 2013-11-01: 1 via ORAL

## 2013-11-01 MED ORDER — BUPIVACAINE HCL (PF) 0.5 % IJ SOLN
INTRAMUSCULAR | Status: DC | PRN
  Administered 2013-11-01: 3 mL

## 2013-11-01 MED ORDER — PROPOFOL 10 MG/ML IV BOLUS
INTRAVENOUS | Status: DC | PRN
  Administered 2013-11-01: 200 mg via INTRAVENOUS
  Administered 2013-11-01: 50 mg via INTRAVENOUS

## 2013-11-01 MED ORDER — PROMETHAZINE HCL 25 MG/ML IJ SOLN: 6.2500 mg | INTRAMUSCULAR | Status: DC | PRN

## 2013-11-01 MED ORDER — INSULIN ASPART 100 UNIT/ML ~~LOC~~ SOLN
0.0000 [IU] | SUBCUTANEOUS | Status: DC
  Administered 2013-11-01: 5 [IU] via SUBCUTANEOUS
  Administered 2013-11-01: 2 [IU] via SUBCUTANEOUS

## 2013-11-01 MED ORDER — PROPOFOL 10 MG/ML IV BOLUS
INTRAVENOUS | Status: AC
  Filled 2013-11-01: qty 20

## 2013-11-01 MED ORDER — HYDROMORPHONE HCL PF 1 MG/ML IJ SOLN: 0.2500 mg | INTRAMUSCULAR | Status: DC | PRN

## 2013-11-01 MED ORDER — LIDOCAINE HCL (CARDIAC) 20 MG/ML IV SOLN
INTRAVENOUS | Status: DC | PRN
  Administered 2013-11-01: 100 mg via INTRAVENOUS

## 2013-11-01 MED ORDER — FENTANYL CITRATE 0.05 MG/ML IJ SOLN
INTRAMUSCULAR | Status: AC
  Filled 2013-11-01: qty 2

## 2013-11-01 MED ORDER — DEXTROSE 5 % IV SOLN
2.0000 g | Freq: Two times a day (BID) | INTRAVENOUS | Status: DC
  Administered 2013-11-01 – 2013-11-02 (×3): 2 g via INTRAVENOUS
  Filled 2013-11-01 (×4): qty 2

## 2013-11-01 MED ORDER — LIDOCAINE HCL (CARDIAC) 20 MG/ML IV SOLN
INTRAVENOUS | Status: AC
  Filled 2013-11-01: qty 5

## 2013-11-01 MED ORDER — LACTATED RINGERS IV SOLN
INTRAVENOUS | Status: AC
  Administered 2013-11-01: 1000 mL via INTRAVENOUS

## 2013-11-01 MED ORDER — MIDAZOLAM HCL 5 MG/5ML IJ SOLN
INTRAMUSCULAR | Status: DC | PRN
  Administered 2013-11-01: 2 mg via INTRAVENOUS

## 2013-11-01 MED ORDER — INSULIN ASPART 100 UNIT/ML ~~LOC~~ SOLN
4.0000 [IU] | Freq: Once | SUBCUTANEOUS | Status: AC
  Administered 2013-11-01: 4 [IU] via SUBCUTANEOUS

## 2013-11-01 MED ORDER — 0.9 % SODIUM CHLORIDE (POUR BTL) OPTIME
TOPICAL | Status: DC | PRN
  Administered 2013-11-01: 1000 mL

## 2013-11-01 MED ORDER — ONDANSETRON HCL 4 MG/2ML IJ SOLN
INTRAMUSCULAR | Status: DC | PRN
  Administered 2013-11-01: 4 mg via INTRAVENOUS

## 2013-11-01 MED ORDER — SODIUM CHLORIDE 0.9 % IV BOLUS (SEPSIS)
500.0000 mL | Freq: Once | INTRAVENOUS | Status: AC
  Administered 2013-11-01: 500 mL via INTRAVENOUS

## 2013-11-01 MED ORDER — MIDAZOLAM HCL 2 MG/2ML IJ SOLN
INTRAMUSCULAR | Status: AC
  Filled 2013-11-01: qty 2

## 2013-11-01 MED ORDER — LACTATED RINGERS IV SOLN: INTRAVENOUS | Status: DC

## 2013-11-01 MED ORDER — FENTANYL CITRATE 0.05 MG/ML IJ SOLN
INTRAMUSCULAR | Status: DC | PRN
  Administered 2013-11-01: 50 ug via INTRAVENOUS
  Administered 2013-11-01: 25 ug via INTRAVENOUS
  Administered 2013-11-01: 50 ug via INTRAVENOUS
  Administered 2013-11-01: 25 ug via INTRAVENOUS

## 2013-11-01 MED ORDER — BUPIVACAINE HCL (PF) 0.5 % IJ SOLN
INTRAMUSCULAR | Status: AC
  Filled 2013-11-01: qty 30

## 2013-11-01 MED ORDER — SODIUM CHLORIDE 0.9 % IR SOLN
Status: DC | PRN
  Administered 2013-11-01: 3000 mL

## 2013-11-01 MED ORDER — INSULIN ASPART 100 UNIT/ML ~~LOC~~ SOLN
0.0000 [IU] | Freq: Three times a day (TID) | SUBCUTANEOUS | Status: DC
  Administered 2013-11-02: 5 [IU] via SUBCUTANEOUS
  Administered 2013-11-02: 3 [IU] via SUBCUTANEOUS

## 2013-11-01 SURGICAL SUPPLY — 43 items
BAG SPEC THK2 15X12 ZIP CLS (MISCELLANEOUS)
BAG ZIPLOCK 12X15 (MISCELLANEOUS) ×1 IMPLANT
BANDAGE ELASTIC 4 VELCRO ST LF (GAUZE/BANDAGES/DRESSINGS) ×1 IMPLANT
BANDAGE ESMARK 6X9 LF (GAUZE/BANDAGES/DRESSINGS) ×1 IMPLANT
BANDAGE GAUZE ELAST BULKY 4 IN (GAUZE/BANDAGES/DRESSINGS) ×2 IMPLANT
BNDG CMPR 9X4 STRL LF SNTH (GAUZE/BANDAGES/DRESSINGS) ×1
BNDG CMPR 9X6 STRL LF SNTH (GAUZE/BANDAGES/DRESSINGS)
BNDG ESMARK 4X9 LF (GAUZE/BANDAGES/DRESSINGS) ×1 IMPLANT
BNDG ESMARK 6X9 LF (GAUZE/BANDAGES/DRESSINGS)
CANISTER SUCTION 2500CC (MISCELLANEOUS) ×2 IMPLANT
CORDS BIPOLAR (ELECTRODE) ×2 IMPLANT
CUFF TOURN SGL QUICK 18 (TOURNIQUET CUFF) ×1 IMPLANT
CUFF TOURN SGL QUICK 24 (TOURNIQUET CUFF) ×2
CUFF TRNQT CYL 24X4X40X1 (TOURNIQUET CUFF) IMPLANT
DRAIN PENROSE 18X1/2 LTX STRL (DRAIN) ×1 IMPLANT
DRAPE SURG 17X11 SM STRL (DRAPES) ×1 IMPLANT
DRSG PAD ABDOMINAL 8X10 ST (GAUZE/BANDAGES/DRESSINGS) ×1 IMPLANT
ELECT REM PT RETURN 9FT ADLT (ELECTROSURGICAL)
ELECTRODE REM PT RTRN 9FT ADLT (ELECTROSURGICAL) ×1 IMPLANT
EVACUATOR 1/8 PVC DRAIN (DRAIN) ×1 IMPLANT
GAUZE PACKING IODOFORM 1/2 (PACKING) ×1 IMPLANT
GAUZE PACKING IODOFORM 1/4X5 (PACKING) ×1 IMPLANT
GAUZE XEROFORM 5X9 LF (GAUZE/BANDAGES/DRESSINGS) ×1 IMPLANT
GLOVE BIO SURGEON STRL SZ8 (GLOVE) ×1 IMPLANT
GLOVE BIOGEL PI IND STRL 7.0 (GLOVE) IMPLANT
GLOVE BIOGEL PI INDICATOR 7.0 (GLOVE) ×1
GOWN PREVENTION PLUS LG XLONG (DISPOSABLE) ×3 IMPLANT
HANDPIECE INTERPULSE COAX TIP (DISPOSABLE)
IV NS IRRIG 3000ML ARTHROMATIC (IV SOLUTION) ×1 IMPLANT
KIT BASIN OR (CUSTOM PROCEDURE TRAY) ×2 IMPLANT
NEEDLE HYPO 22GX1.5 SAFETY (NEEDLE) ×1 IMPLANT
NS IRRIG 1000ML POUR BTL (IV SOLUTION) ×1 IMPLANT
PACK LOWER EXTREMITY WL (CUSTOM PROCEDURE TRAY) ×2 IMPLANT
PAD CAST 4YDX4 CTTN HI CHSV (CAST SUPPLIES) ×1 IMPLANT
PADDING CAST COTTON 4X4 STRL (CAST SUPPLIES) ×4
POSITIONER SURGICAL ARM (MISCELLANEOUS) ×2 IMPLANT
SET CYSTO W/LG BORE CLAMP LF (SET/KITS/TRAYS/PACK) ×1 IMPLANT
SET HNDPC FAN SPRY TIP SCT (DISPOSABLE) ×1 IMPLANT
SOL PREP POV-IOD 16OZ 10% (MISCELLANEOUS) ×1 IMPLANT
SPONGE GAUZE 4X4 12PLY (GAUZE/BANDAGES/DRESSINGS) ×2 IMPLANT
SUT ETHILON 4 0 PS 2 18 (SUTURE) ×3 IMPLANT
SYR CONTROL 10ML LL (SYRINGE) ×2 IMPLANT
TOWEL OR 17X26 10 PK STRL BLUE (TOWEL DISPOSABLE) ×3 IMPLANT

## 2013-11-01 NOTE — Op Note (Signed)
See note [phone]

## 2013-11-01 NOTE — Anesthesia Preprocedure Evaluation (Addendum)
Anesthesia Evaluation  Patient identified by MRN, date of birth, ID band Patient awake    Reviewed: Allergy & Precautions, H&P , NPO status , Patient's Chart, lab work & pertinent test results  Airway Mallampati: II TM Distance: >3 FB Neck ROM: Full    Dental no notable dental hx.    Pulmonary neg pulmonary ROS,  breath sounds clear to auscultation  Pulmonary exam normal       Cardiovascular hypertension, Pt. on medications and Pt. on home beta blockers + Past MI and + Cardiac Stents negative cardio ROS  Rhythm:Regular Rate:Normal     Neuro/Psych  Neuromuscular disease negative psych ROS   GI/Hepatic Neg liver ROS, GERD-  Medicated,Gastroparesis   Endo/Other  diabetes, Type 2, Oral Hypoglycemic Agents and Insulin DependentMorbid obesity  Renal/GU negative Renal ROS  negative genitourinary   Musculoskeletal negative musculoskeletal ROS (+)   Abdominal (+) + obese,   Peds negative pediatric ROS (+)  Hematology  (+) Blood dyscrasia, ,   Anesthesia Other Findings   Reproductive/Obstetrics                          Anesthesia Physical Anesthesia Plan  ASA: III  Anesthesia Plan: General   Post-op Pain Management:    Induction: Intravenous  Airway Management Planned: LMA  Additional Equipment:   Intra-op Plan:   Post-operative Plan: Extubation in OR  Informed Consent: I have reviewed the patients History and Physical, chart, labs and discussed the procedure including the risks, benefits and alternatives for the proposed anesthesia with the patient or authorized representative who has indicated his/her understanding and acceptance.   Dental advisory given  Plan Discussed with: CRNA  Anesthesia Plan Comments:         Anesthesia Quick Evaluation

## 2013-11-01 NOTE — Interval H&P Note (Signed)
History and Physical Interval Note:  11/01/2013 5:16 PM  Joshua [name]  has presented today for surgery, with the diagnosis of infection right small finger  The various methods of treatment have been discussed with the patient and family. After consideration of risks, benefits and other options for treatment, the patient has consented to  Procedure(s): REPEAT IRRIGATION AND DEBRIDEMENT RIGHT  EXTREMITY (Right) as a surgical intervention .  The patient's history has been reviewed, patient examined, no change in status, stable for surgery.  I have reviewed the patient's chart and labs.  Questions were answered to the patient's satisfaction.     Charlotte Crumb A

## 2013-11-01 NOTE — Preoperative (Signed)
Beta Blockers   Reason not to administer Beta Blockers:Not Applicable 

## 2013-11-01 NOTE — Progress Notes (Signed)
[facility] PROGRESS NOTE  Joshua [name] TMH:[id number] DOB: 1956/05/15 DOA: 10/28/2013 PCP: [name], MD  Assessment/Plan  Severe infection of the right small finger including the flexor and extensor sheaths with Klebsiella.   - Sensitive to unsyn per initial culture data but temperature trending up and repeatedly febrile overnight.  Possible resistance developing? -  D/c unasyn -  Start cefepime -  Appreciate orthopedics assistance:  Per patient, planning for repeat washout this afternoon  Uncontrolled diabetes mellitus as evidenced by an A1c of 16%. CBG elevated this AM.  Patient eating well and normally receives ~60 units of long-acting insulin daily per home regimen.   -  AM lantus held by RN due to NPO -  Continue lantus 30 units BID -  Change to SSI q4h while NPO then resume previous insulin regimen post-operatively   1. OSA, stable. Continue CPAP  2. Hypertension, BP mildly elevated but trending down as infection and pain are treated.  Continue home meds and control pain 3. Dyslipidemia - will improve when diabetes is under better control, pt counseled.  4. Alcoholism - CIWA scores are 0.  Continue ativan prn elevated CIWA scores 5. CAD s/p MI with stenting - continue plavix  6. Diabetic Gastroparesis, stable.  Tolerating food.  May add reglan if needed 7. Constipation due to pain medication.  Continue colace, senna, miralax, and prn bisacodyl.  Add magnesium citrate  Diet:  NPO Access:  PIV IVF:  yes Proph:  SCDs  Code Status: full Family Communication: patient alone Disposition Plan: pending improvement in hand infection, clearance by hand surgeons.     Consultants:  Hand surgery, Dr. Marzetta Merino  Procedures:  I&D 11/18 of right hand 5th finger  Antibiotics: Unasyn 11/17 >> 11/20 Cefepime 11/20 >>  HPI/Subjective:  Still having pain in the right hand 5th finger.  Still constipated.  Had fevers, chills overnight.    Objective: Filed  Vitals:   10/31/13 2349 11/01/13 0617 11/01/13 0947 11/01/13 1400  BP:  180/81 130/81 152/71  Pulse:  75 68 68  Temp: 100 F (37.8 C) 100.9 F (38.3 C) 98.6 F (37 C) 99.5 F (37.5 C)  TempSrc: Oral Oral Oral Oral  Resp:  $Remo'16 16 16  'hwFwg$ Height:      Weight:      SpO2:  97% 97% 96%    Intake/Output Summary (Last 24 hours) at 11/01/13 1444 Last data filed at 11/01/13 1410  Gross per 24 hour  Intake 1544.17 ml  Output   2100 ml  Net -555.83 ml   Filed Weights   10/29/13 0340  Weight: 127.914 kg (282 lb)    Exam:   General:  AAM, No acute distress, stable from yesterday, sitting up in bed  HEENT:  NCAT, MMM  Cardiovascular:  RRR, nl S1, S2 no mrg, 2+ pulses, warm extremities  Respiratory:  CTAB, no increased WOB  Abdomen:   NABS, soft, NT/ND  MSK:   Normal tone and bulk, no LEE.  Right hand wrapped in kerlix, little finger not visible.  Able to wiggle fingers, warm, well-perfused, 2+ radial pulse.    Neuro:  Grossly intact  Data Reviewed: Basic Metabolic Panel:  Recent Labs Lab 10/28/13 2300 10/30/13 0400  NA 131* 137  K 4.4 3.5  CL 94* 100  CO2 24 26  GLUCOSE 529* 182*  BUN 22 15  CREATININE 1.21 1.11  CALCIUM 9.4 9.1   Liver Function Tests:  Recent Labs Lab 10/30/13 0400  AST 10  ALT  9  ALKPHOS 109  BILITOT 0.2*  PROT 6.9  ALBUMIN 2.5*   No results found for this basename: LIPASE, AMYLASE,  in the last 168 hours No results found for this basename: AMMONIA,  in the last 168 hours CBC:  Recent Labs Lab 10/28/13 2300 10/30/13 0400  WBC 8.9 6.6  NEUTROABS  --  4.0  HGB 12.4* 11.8*  HCT 37.1* 36.4*  MCV 84.9 85.0  PLT 277 259   Cardiac Enzymes: No results found for this basename: CKTOTAL, CKMB, CKMBINDEX, TROPONINI,  in the last 168 hours BNP (last 3 results) No results found for this basename: PROBNP,  in the last 8760 hours CBG:  Recent Labs Lab 10/31/13 1201 10/31/13 1725 10/31/13 2203 11/01/13 0806 11/01/13 1212  GLUCAP 228*  144* 260* 232* 148*    Recent Results (from the past 240 hour(s))  GRAM STAIN     Status: None   Collection Time    10/29/13 12:42 AM      Result Value Range Status   Specimen Description WOUND RIGHT SMALL FINGER INFECTION   Final   Special Requests PATIENT ON FOLLOWING KEFLEX   Final   Gram Stain     Final   Value: FEW SQUAMOUS EPITHELIAL CELLS PRESENT     FEW WBC PRESENT, PREDOMINANTLY PMN     FEW GRAM NEGATIVE RODS     Gram Stain Report Called to,Read Back By and Verified With: DR.WEINGOLD AT 0205 ON 10/29/13 BY HOBBINS, J.   Report Status 10/29/2013 FINAL   Final  ANAEROBIC CULTURE     Status: None   Collection Time    10/29/13 12:42 AM      Result Value Range Status   Specimen Description WOUND RIGHT SMALL FINGER INFECTION   Final   Special Requests PATIENT ON FOLLOWING KEFLEX   Final   Gram Stain     Final   Value: NO WBC SEEN     MODERATE SQUAMOUS EPITHELIAL CELLS PRESENT     NO ORGANISMS SEEN     Performed at [facility]   Culture     Final   Value: NO ANAEROBES ISOLATED; CULTURE IN PROGRESS FOR 5 DAYS     Performed at [facility]   Report Status PENDING   Incomplete  WOUND CULTURE     Status: None   Collection Time    10/29/13 12:42 AM      Result Value Range Status   Specimen Description WOUND RIGHT SMALL FINGER INFECTION   Final   Special Requests PATIENT ON FOLLOWING KEFLEX   Final   Gram Stain     Final   Value: NO WBC SEEN     MODERATE SQUAMOUS EPITHELIAL CELLS PRESENT     RARE GRAM NEGATIVE RODS     Performed at [facility]   Culture     Final   Value: ABUNDANT KLEBSIELLA PNEUMONIAE     Performed at [facility]   Report Status 10/31/2013 FINAL   Final   Organism ID, Bacteria KLEBSIELLA PNEUMONIAE   Final  GRAM STAIN     Status: None   Collection Time    10/29/13 12:48 AM      Result Value Range Status   Specimen Description WOUND RIGHT SMALL FINGER DEEP   Final   Special Requests PATIENT ON FOLLOWING KEFLEX   Final    Gram Stain     Final   Value: RARE SQUAMOUS EPITHELIAL CELLS PRESENT     RARE WBC  PRESENT, PREDOMINANTLY PMN     RARE GRAM NEGATIVE RODS     Gram Stain Report Called to,Read Back By and Verified With: DR. [name] AT 0205 ON 10/29/13 BY HOBBINS, J.   Report Status 10/29/2013 FINAL   Final  ANAEROBIC CULTURE     Status: None   Collection Time    10/29/13 12:48 AM      Result Value Range Status   Specimen Description WOUND RIGHT SMALL FINGER DEEP   Final   Special Requests PATIENT ON FOLLOWING KEFLEX   Final   Gram Stain     Final   Value: FEW WBC PRESENT,BOTH PMN AND MONONUCLEAR     NO SQUAMOUS EPITHELIAL CELLS SEEN     NO ORGANISMS SEEN     Performed at [facility]   Culture     Final   Value: NO ANAEROBES ISOLATED; CULTURE IN PROGRESS FOR 5 DAYS     Performed at [facility]   Report Status PENDING   Incomplete  WOUND CULTURE     Status: None   Collection Time    10/29/13 12:48 AM      Result Value Range Status   Specimen Description WOUND RIGHT SMALL FINGER DEEP   Final   Special Requests PATIENT ON FOLLOWING KEFLEX   Final   Gram Stain     Final   Value: NO WBC SEEN     NO SQUAMOUS EPITHELIAL CELLS SEEN     NO ORGANISMS SEEN     Performed at [facility]   Culture     Final   Value: KLEBSIELLA PNEUMONIAE     Performed at [facility]   Report Status 10/31/2013 FINAL   Final   Organism ID, Bacteria KLEBSIELLA PNEUMONIAE   Final  GRAM STAIN     Status: None   Collection Time    10/30/13  5:21 PM      Result Value Range Status   Specimen Description FINGER SMALL RIGHT   Final   Special Requests NONE   Final   Gram Stain     Final   Value: RARE WBC PRESENT, PREDOMINANTLY MONONUCLEAR     NO ORGANISMS SEEN     Gram Stain Report Called to,Read Back By and Verified With: DR. Ander Slade AT 4967 ON 11.18.14 BY LOVE,T.   Report Status 10/30/2013 FINAL   Final  ANAEROBIC CULTURE     Status: None   Collection Time    10/30/13  5:21 PM       Result Value Range Status   Specimen Description FINGER SMALL RIGHT   Final   Special Requests NONE   Final   Gram Stain     Final   Value: RARE WBC PRESENT, PREDOMINANTLY PMN     NO SQUAMOUS EPITHELIAL CELLS SEEN     NO ORGANISMS SEEN     Performed at [facility]   Culture     Final   Value: NO ANAEROBES ISOLATED; CULTURE IN PROGRESS FOR 5 DAYS     Performed at [facility]   Report Status PENDING   Incomplete  CULTURE, ROUTINE-ABSCESS     Status: None   Collection Time    10/30/13  5:21 PM      Result Value Range Status   Specimen Description FINGER SMALL RIGHT   Final   Special Requests NONE   Final   Gram Stain     Final   Value: RARE WBC PRESENT, PREDOMINANTLY PMN  NO SQUAMOUS EPITHELIAL CELLS SEEN     NO ORGANISMS SEEN     Performed at [facility]   Culture     Final   Value: MODERATE GRAM NEGATIVE RODS     Performed at [facility]   Report Status PENDING   Incomplete  GRAM STAIN     Status: None   Collection Time    10/30/13  5:49 PM      Result Value Range Status   Specimen Description ABSCESS RIGHT 5TH FINGER DORSAL   Final   Special Requests NONE   Final   Gram Stain     Final   Value: FEW WBC PRESENT,BOTH PMN AND MONONUCLEAR     NO ORGANISMS SEEN     Gram Stain Report Called to,Read Back By and Verified With: PLATT,J. AT 1831 ON 11.18.14 BY LOVE,T.   Report Status 10/30/2013 FINAL   Final  ANAEROBIC CULTURE     Status: None   Collection Time    10/30/13  5:49 PM      Result Value Range Status   Specimen Description ABSCESS RIGHT 5TH FINGER DORSAL   Final   Special Requests NONE   Final   Gram Stain     Final   Value: FEW WBC PRESENT, PREDOMINANTLY PMN     NO SQUAMOUS EPITHELIAL CELLS SEEN     NO ORGANISMS SEEN     Performed at [facility]   Culture     Final   Value: NO ANAEROBES ISOLATED; CULTURE IN PROGRESS FOR 5 DAYS     Performed at [facility]   Report Status PENDING   Incomplete  CULTURE,  ROUTINE-ABSCESS     Status: None   Collection Time    10/30/13  5:49 PM      Result Value Range Status   Specimen Description ABSCESS RIGHT 5TH FINGER DORSAL   Final   Special Requests NONE   Final   Gram Stain     Final   Value: FEW WBC PRESENT, PREDOMINANTLY PMN     NO SQUAMOUS EPITHELIAL CELLS SEEN     NO ORGANISMS SEEN     Performed at [facility]   Culture     Final   Value: MODERATE GRAM NEGATIVE RODS     Performed at [facility]   Report Status PENDING   Incomplete     Studies: No results found.  Scheduled Meds: . allopurinol  300 mg Oral Daily  . ceFEPime (MAXIPIME) IV  2 g Intravenous Q12H  . clopidogrel  75 mg Oral Daily  . docusate sodium  100 mg Oral BID  . folic acid  1 mg Oral Daily  . hydrochlorothiazide  12.5 mg Oral Daily  . insulin aspart  0-15 Units Subcutaneous Q4H  . insulin glargine  30 Units Subcutaneous QHS  . LORazepam  0-4 mg Intravenous Q12H  . magnesium citrate  1 Bottle Oral Once  . metoprolol succinate  100 mg Oral Daily  . multivitamin with minerals  1 tablet Oral Daily  . polyethylene glycol  17 g Oral Daily  . senna  2 tablet Oral QHS  . thiamine  100 mg Oral Daily   Or  . thiamine  100 mg Intravenous Daily   Continuous Infusions: . sodium chloride 75 mL/hr at 11/01/13 0830    Active Problems:   DIABETES MELLITUS, TYPE II   HYPERLIPIDEMIA   HYPERTENSION   GERD   Presence of stent in anterior  descending branch of left coronary artery   Abscess of fifth finger   Uncontrolled diabetes mellitus with peripheral autonomic neuropathy   Uncontrolled diabetes mellitus   Hyperglycemia    Time spent: 30 min    Analea Muller, [facility] Pager [phone]. If 7PM-7AM, please contact night-coverage at [email or web address], password [facility] 11/01/2013, 2:44 PM  LOS: 4 days

## 2013-11-01 NOTE — Transfer of Care (Signed)
Immediate Anesthesia Transfer of Care Note  Patient: Joshua [name]  Procedure(s) Performed: Procedure(s) (LRB):  IRRIGATION AND DEBRIDEMENT RIGHT  PROXIMAL PHALANGEAL LEVEL AMPUTATION (Right)  Patient Location: PACU  Anesthesia Type: General  Level of Consciousness: sedated, patient cooperative and responds to stimulation  Airway & Oxygen Therapy: Patient Spontanous Breathing and Patient connected to face mask oxgen  Post-op Assessment: Report given to PACU RN and Post -op Vital signs reviewed and stable  Post vital signs: Reviewed and stable  Complications: No apparent anesthesia complications

## 2013-11-01 NOTE — Progress Notes (Signed)
Patient underwent proximal phalangeal joint level amputation today   Will need to see in my office this Tuesday for dressing change

## 2013-11-01 NOTE — Progress Notes (Signed)
ANTIBIOTIC CONSULT NOTE - INITIAL  Pharmacy Consult for Cefepime Indication: wound infection  No Known Allergies  Patient Measurements: Height: 5\' 9"  (175.3 cm) Weight: 282 lb (127.914 kg) IBW/kg (Calculated) : 70.7  Vital Signs: Temp: 100.9 F (38.3 C) (11/20 0617) Temp src: Oral (11/20 0617) BP: 180/81 mmHg (11/20 0617) Pulse Rate: 75 (11/20 0617) Intake/Output from previous day: 11/19 0701 - 11/20 0700 In: 1826.7 [P.O.:960; I.V.:716.7; IV Piggyback:150] Out: 2100 [Urine:2100] Intake/Output from this shift:    Labs:  Recent Labs  10/30/13 0400  WBC 6.6  HGB 11.8*  PLT 259  CREATININE 1.11   Estimated Creatinine Clearance: 99.5 ml/min (by C-G formula based on Cr of 1.11). No results found for this basename: VANCOTROUGH, VANCOPEAK, VANCORANDOM, [place], [place], [place], [place], TOBRAPEAK, TOBRARND, AMIKACINPEAK, AMIKACINTROU, AMIKACIN,  in the last 72 hours   Microbiology: Recent Results (from the past 720 hour(s))  GRAM STAIN     Status: None   Collection Time    10/29/13 12:42 AM      Result Value Range Status   Specimen Description WOUND RIGHT SMALL FINGER INFECTION   Final   Special Requests PATIENT ON FOLLOWING KEFLEX   Final   Gram Stain     Final   Value: FEW SQUAMOUS EPITHELIAL CELLS PRESENT     FEW WBC PRESENT, PREDOMINANTLY PMN     FEW GRAM NEGATIVE RODS     Gram Stain Report Called to,Read Back By and Verified With: DR.WEINGOLD AT 0205 ON 10/29/13 BY HOBBINS, J.   Report Status 10/29/2013 FINAL   Final  ANAEROBIC CULTURE     Status: None   Collection Time    10/29/13 12:42 AM      Result Value Range Status   Specimen Description WOUND RIGHT SMALL FINGER INFECTION   Final   Special Requests PATIENT ON FOLLOWING KEFLEX   Final   Gram Stain     Final   Value: NO WBC SEEN     MODERATE SQUAMOUS EPITHELIAL CELLS PRESENT     NO ORGANISMS SEEN     Performed at [facility]   Culture     Final   Value: NO ANAEROBES ISOLATED;  CULTURE IN PROGRESS FOR 5 DAYS     Performed at [facility]   Report Status PENDING   Incomplete  WOUND CULTURE     Status: None   Collection Time    10/29/13 12:42 AM      Result Value Range Status   Specimen Description WOUND RIGHT SMALL FINGER INFECTION   Final   Special Requests PATIENT ON FOLLOWING KEFLEX   Final   Gram Stain     Final   Value: NO WBC SEEN     MODERATE SQUAMOUS EPITHELIAL CELLS PRESENT     RARE GRAM NEGATIVE RODS     Performed at [facility]   Culture     Final   Value: ABUNDANT KLEBSIELLA PNEUMONIAE     Performed at [facility]   Report Status 10/31/2013 FINAL   Final   Organism ID, Bacteria KLEBSIELLA PNEUMONIAE   Final  GRAM STAIN     Status: None   Collection Time    10/29/13 12:48 AM      Result Value Range Status   Specimen Description WOUND RIGHT SMALL FINGER DEEP   Final   Special Requests PATIENT ON FOLLOWING KEFLEX   Final   Gram Stain     Final   Value: RARE SQUAMOUS EPITHELIAL CELLS PRESENT  RARE WBC PRESENT, PREDOMINANTLY PMN     RARE GRAM NEGATIVE RODS     Gram Stain Report Called to,Read Back By and Verified With: DR. [name] AT 0205 ON 10/29/13 BY HOBBINS, J.   Report Status 10/29/2013 FINAL   Final  ANAEROBIC CULTURE     Status: None   Collection Time    10/29/13 12:48 AM      Result Value Range Status   Specimen Description WOUND RIGHT SMALL FINGER DEEP   Final   Special Requests PATIENT ON FOLLOWING KEFLEX   Final   Gram Stain     Final   Value: FEW WBC PRESENT,BOTH PMN AND MONONUCLEAR     NO SQUAMOUS EPITHELIAL CELLS SEEN     NO ORGANISMS SEEN     Performed at [facility]   Culture     Final   Value: NO ANAEROBES ISOLATED; CULTURE IN PROGRESS FOR 5 DAYS     Performed at [facility]   Report Status PENDING   Incomplete  WOUND CULTURE     Status: None   Collection Time    10/29/13 12:48 AM      Result Value Range Status   Specimen Description WOUND RIGHT SMALL FINGER DEEP   Final    Special Requests PATIENT ON FOLLOWING KEFLEX   Final   Gram Stain     Final   Value: NO WBC SEEN     NO SQUAMOUS EPITHELIAL CELLS SEEN     NO ORGANISMS SEEN     Performed at [facility]   Culture     Final   Value: KLEBSIELLA PNEUMONIAE     Performed at [facility]   Report Status 10/31/2013 FINAL   Final   Organism ID, Bacteria KLEBSIELLA PNEUMONIAE   Final  GRAM STAIN     Status: None   Collection Time    10/30/13  5:21 PM      Result Value Range Status   Specimen Description FINGER SMALL RIGHT   Final   Special Requests NONE   Final   Gram Stain     Final   Value: RARE WBC PRESENT, PREDOMINANTLY MONONUCLEAR     NO ORGANISMS SEEN     Gram Stain Report Called to,Read Back By and Verified With: DR. Ander Slade AT 5366 ON 11.18.14 BY LOVE,T.   Report Status 10/30/2013 FINAL   Final  ANAEROBIC CULTURE     Status: None   Collection Time    10/30/13  5:21 PM      Result Value Range Status   Specimen Description FINGER SMALL RIGHT   Final   Special Requests NONE   Final   Gram Stain     Final   Value: RARE WBC PRESENT, PREDOMINANTLY PMN     NO SQUAMOUS EPITHELIAL CELLS SEEN     NO ORGANISMS SEEN     Performed at [facility]   Culture     Final   Value: NO ANAEROBES ISOLATED; CULTURE IN PROGRESS FOR 5 DAYS     Performed at [facility]   Report Status PENDING   Incomplete  CULTURE, ROUTINE-ABSCESS     Status: None   Collection Time    10/30/13  5:21 PM      Result Value Range Status   Specimen Description FINGER SMALL RIGHT   Final   Special Requests NONE   Final   Gram Stain     Final   Value: RARE WBC PRESENT, PREDOMINANTLY  PMN     NO SQUAMOUS EPITHELIAL CELLS SEEN     NO ORGANISMS SEEN     Performed at [facility]   Culture PENDING   Incomplete   Report Status PENDING   Incomplete  GRAM STAIN     Status: None   Collection Time    10/30/13  5:49 PM      Result Value Range Status   Specimen Description ABSCESS RIGHT 5TH  FINGER DORSAL   Final   Special Requests NONE   Final   Gram Stain     Final   Value: FEW WBC PRESENT,BOTH PMN AND MONONUCLEAR     NO ORGANISMS SEEN     Gram Stain Report Called to,Read Back By and Verified With: PLATT,J. AT 7124 ON 11.18.14 BY LOVE,T.   Report Status 10/30/2013 FINAL   Final  ANAEROBIC CULTURE     Status: None   Collection Time    10/30/13  5:49 PM      Result Value Range Status   Specimen Description ABSCESS RIGHT 5TH FINGER DORSAL   Final   Special Requests NONE   Final   Gram Stain     Final   Value: FEW WBC PRESENT, PREDOMINANTLY PMN     NO SQUAMOUS EPITHELIAL CELLS SEEN     NO ORGANISMS SEEN     Performed at [facility]   Culture     Final   Value: NO ANAEROBES ISOLATED; CULTURE IN PROGRESS FOR 5 DAYS     Performed at [facility]   Report Status PENDING   Incomplete  CULTURE, ROUTINE-ABSCESS     Status: None   Collection Time    10/30/13  5:49 PM      Result Value Range Status   Specimen Description ABSCESS RIGHT 5TH FINGER DORSAL   Final   Special Requests NONE   Final   Gram Stain     Final   Value: FEW WBC PRESENT, PREDOMINANTLY PMN     NO SQUAMOUS EPITHELIAL CELLS SEEN     NO ORGANISMS SEEN     Performed at [facility]   Culture PENDING   Incomplete   Report Status PENDING   Incomplete    Medical History: Past Medical History  Diagnosis Date  . Uncontrolled diabetes mellitus with complications   . Hypertension   . Hyperlipemia   . GERD (gastroesophageal reflux disease)   . Diabetic gastroparesis   . Polyneuropathy in diabetes(357.2)     Medications:  Scheduled:  . allopurinol  300 mg Oral Daily  . clopidogrel  75 mg Oral Daily  . docusate sodium  100 mg Oral BID  . folic acid  1 mg Oral Daily  . hydrochlorothiazide  12.5 mg Oral Daily  . insulin aspart  0-15 Units Subcutaneous TID WC  . insulin aspart  0-5 Units Subcutaneous QHS  . insulin aspart  10 Units Subcutaneous TID WC  . insulin glargine  30 Units  Subcutaneous QHS  . LORazepam  0-4 mg Intravenous Q12H  . metoprolol succinate  100 mg Oral Daily  . multivitamin with minerals  1 tablet Oral Daily  . polyethylene glycol  17 g Oral Daily  . senna  2 tablet Oral QHS  . thiamine  100 mg Oral Daily   Or  . thiamine  100 mg Intravenous Daily   Infusions:  . sodium chloride 50 mL/hr at 10/31/13 1940   Assessment: 57 yo known to pharmacy for dosing of vancomycin initially.  This was changed to Unasyn based on cultures of klebsiella sensitivities but now patient has spiked a fever again on D4 Unasyn and Md has written to broaden abx to Cefepime per pharmacy dosing and d/c'd the Unasyn  Goal of Therapy:  eradication of infection  Plan:  1) Cefepime 2g IV q12 2) adjustment per renal function   Adrian Saran, PharmD, BCPS Pager [phone] 11/01/2013 7:49 AM

## 2013-11-02 ENCOUNTER — Encounter (HOSPITAL_COMMUNITY): Payer: Self-pay | Admitting: Orthopedic Surgery

## 2013-11-02 DIAGNOSIS — E1149 Type 2 diabetes mellitus with other diabetic neurological complication: Secondary | ICD-10-CM

## 2013-11-02 DIAGNOSIS — G909 Disorder of the autonomic nervous system, unspecified: Secondary | ICD-10-CM

## 2013-11-02 LAB — CULTURE, ROUTINE-ABSCESS

## 2013-11-02 LAB — GLUCOSE, CAPILLARY
Glucose-Capillary: 152 mg/dL — ABNORMAL HIGH (ref 70–99)
Glucose-Capillary: 228 mg/dL — ABNORMAL HIGH (ref 70–99)

## 2013-11-02 MED ORDER — ROSUVASTATIN CALCIUM 40 MG PO TABS: 40.0000 mg | ORAL_TABLET | Freq: Every day | ORAL | Status: DC

## 2013-11-02 MED ORDER — INSULIN GLARGINE 100 UNIT/ML ~~LOC~~ SOLN
30.0000 [IU] | Freq: Two times a day (BID) | SUBCUTANEOUS | Status: DC
  Administered 2013-11-02: 30 [IU] via SUBCUTANEOUS
  Filled 2013-11-02 (×2): qty 0.3

## 2013-11-02 MED ORDER — LEVOFLOXACIN 750 MG PO TABS: 750.0000 mg | ORAL_TABLET | Freq: Every day | ORAL | Status: DC

## 2013-11-02 MED ORDER — POLYETHYLENE GLYCOL 3350 17 G PO PACK: 17.0000 g | PACK | Freq: Every day | ORAL | Status: DC

## 2013-11-02 MED ORDER — AMLODIPINE-OLMESARTAN 10-20 MG PO TABS: 1.0000 | ORAL_TABLET | Freq: Every day | ORAL | Status: DC

## 2013-11-02 MED ORDER — INSULIN NPH ISOPHANE & REGULAR (70-30) 100 UNIT/ML ~~LOC~~ SUSP: 40.0000 [IU] | Freq: Two times a day (BID) | SUBCUTANEOUS | Status: DC

## 2013-11-02 MED ORDER — SPIRONOLACTONE 25 MG PO TABS: 12.5000 mg | ORAL_TABLET | Freq: Every day | ORAL | Status: DC

## 2013-11-02 MED ORDER — METFORMIN HCL 500 MG PO TABS: 500.0000 mg | ORAL_TABLET | Freq: Two times a day (BID) | ORAL | Status: DC

## 2013-11-02 MED ORDER — OXYCODONE-ACETAMINOPHEN 5-325 MG PO TABS: 1.0000 | ORAL_TABLET | ORAL | Status: DC | PRN

## 2013-11-02 NOTE — Discharge Summary (Addendum)
Physician Discharge Summary  Joshua [name] TTS:[id number] DOB: 1956-03-04 DOA: 10/28/2013  PCP: Joshua Patella, MD  Admit date: 10/28/2013 Discharge date: 11/02/2013  Recommendations for Outpatient Follow-up:  1. Follow up with Dr. Mina Marble on 11/25 for dressing change and repeat evaluation 2. Continue levofloxacin once daily until follow up  3. PCP in 1 week for management of diabetes 4. Outpatient diabetic education  Discharge Diagnoses:  Principal Problem:   Abscess of fifth finger Active Problems:   DIABETES MELLITUS, TYPE II   HYPERLIPIDEMIA   HYPERTENSION   GERD   Presence of stent in anterior descending branch of left coronary artery   Uncontrolled diabetes mellitus with peripheral autonomic neuropathy   Uncontrolled diabetes mellitus   Hyperglycemia   Discharge Condition: stable, improved  Diet recommendation: diabetic diet  Wt Readings from Last 3 Encounters:  10/29/13 127.914 kg (282 lb)  10/29/13 127.914 kg (282 lb)  10/29/13 127.914 kg (282 lb)    History of present illness:  Joshua [name] is a 57 y.o. male with past medical history significant for insulin-dependent diabetes complaining of increasing swelling and purulent discharge from right fifth digit. Patient works at a [employer] and cut his finger 10 days ago. He was seen and was given Keflex, but it didn't get better. He was taken to the OR by Dr Dairl Ponder, and had I and D of his left 5th finger. Hospitalist was asked to admit him because of his DM and HTN. He didn't recall having an up to date Tetanus shot. He also has known CAD and had 2 cardiac stent placement. He has sleep apnea, and has been wearing his CPAP machine. He works night shifts, so he sleep during the day. Serology was unremarkable except for the BS that was elevated. I was asked to admit him from the PACU. He was alert, in no distress, and was conversing meaningfully to me. He does drink about 3 beers per  day.   Hospital Course:   [name] was hospitalized with severe infection of the right hand fifth finger.  He underwent incision and drainage the day of admission and was started on vancomycin.  His gram stain demonstrated gram negative rods, however, so he was transitioned to unasyn.  His culture grew Klebsiella sensitive to unasyn bur resistant to ampicillin.  He returned to the OR on 11/18 which demonstrated extension of infection into the dorsal extensor sheath.  The following two days his temperature increased and he was transitioned to cefepime with the thought that perhaps he did have some resistance to unasyn while awaiting the subsequent cultures.  He also returned to the OR on 11/20 for amputation at the PIP level.  His fevers have trended down.  Cultures during his second procedure also grew Klebsiella that was sensitive to Unasyn.  I spoke with infectious disease regarding oral antibiotic options with good oral absorption and bone penetration and they recommended levofloxacin.  He was given a month long prescription and will follow up in the infectious disease clinic in 1 week and with hand surgery in 4 days for reevaluation.    Uncontrolled diabetes mellitus with hemoglobin A1c of 16%.  His primary care doctor has been managing his diabetes and adjusting his medications.  He was given lantus and Debborah Alonge acting insulin as needed in the hospital.  He should resume his 70/30 insulin and metformin and see his primary physician next week for further titration of his insulin and metformin.  His inpatient fingersticks are likely  not representative of his baseline.    His other chronic medical problems remained stable and he continued his home medicatiosn.    Consultants:  Hand surgery, Dr. Marzetta Merino Procedures:  I&D 11/17 of right hand 5th finger I&D 11/18 of right hand 5th finger Amputation at the PIP level right hand 5th finger Antibiotics:  Vancomycin 11/17 x 1 Unasyn 11/17 >> 11/20   Cefepime 11/20 >>11/21 Levofloxacin 11/21 >>   Discharge Exam: Filed Vitals:   11/02/13 0614  BP: 143/79  Pulse: 61  Temp: 99.2 F (37.3 C)  Resp: 14   Filed Vitals:   11/01/13 2044 11/01/13 2144 11/02/13 0156 11/02/13 0614  BP: 135/77 124/72 151/85 143/79  Pulse: 71 75 65 61  Temp: 99.1 F (37.3 C) 99.6 F (37.6 C) 99.5 F (37.5 C) 99.2 F (37.3 C)  TempSrc: Oral Oral Oral Oral  Resp: $Remo'16 16 16 14  'TfzEt$ Height:      Weight:      SpO2: 97% 98% 99% 99%    General: AAM, No acute distress HEENT: NCAT, MMM  Cardiovascular: RRR, nl S1, S2 no mrg, 2+ pulses, warm extremities  Respiratory: CTAB, no increased WOB  Abdomen: NABS, soft, NT/ND  MSK: Normal tone and bulk, no LEE. Right hand bandaged, little finger not visible. Able to wiggle fingers, warm, well-perfused, 2+ radial pulse.  Neuro: Grossly intact   Discharge Instructions      Discharge Orders   Future Orders Complete By Expires   Ambulatory referral to Nutrition and Diabetic Education  As directed    Call MD for:  difficulty breathing, headache or visual disturbances  As directed    Call MD for:  extreme fatigue  As directed    Call MD for:  hives  As directed    Call MD for:  persistant dizziness or light-headedness  As directed    Call MD for:  persistant nausea and vomiting  As directed    Call MD for:  redness, tenderness, or signs of infection (pain, swelling, redness, odor or green/yellow discharge around incision site)  As directed    Call MD for:  severe uncontrolled pain  As directed    Call MD for:  temperature >100.4  As directed    Diet Carb Modified  As directed    Discharge instructions  As directed    Comments:     You were hospitalized with infection of the right hand.  You had amputation of the last two bones of your fifth finger to help remove infection.  Please take levofloxacin until directed to stop by the infectious disease doctors or the hand surgeons.  I have given you a month supply.  You  may resume your previous medications except for the keflex.   Increase activity slowly  As directed    Leave dressing on - Keep it clean, dry, and intact until clinic visit  As directed        Medication List    STOP taking these medications       cephALEXin 500 MG capsule  Commonly known as:  KEFLEX      TAKE these medications       allopurinol 300 MG tablet  Commonly known as:  ZYLOPRIM  Take 300 mg by mouth daily.     amlodipine-olmesartan 10-20 MG per tablet  Commonly known as:  AZOR  Take 1 tablet by mouth daily. Azor 10/$RemoveBefore'40mg'SresOyWgJMHFk$  once a day     clopidogrel 75 MG tablet  Commonly known as:  PLAVIX  Take 1 tablet by mouth daily.     esomeprazole 40 MG capsule  Commonly known as:  NEXIUM  Take 1 capsule (40 mg total) by mouth daily.     hydrochlorothiazide 25 MG tablet  Commonly known as:  HYDRODIURIL  Take 0.5 tablets by mouth daily.     insulin NPH-regular (70-30) 100 UNIT/ML injection  Commonly known as:  HUMULIN 70/30  Inject 40 Units into the skin 2 (two) times daily.     levofloxacin 750 MG tablet  Commonly known as:  LEVAQUIN  Take 1 tablet (750 mg total) by mouth daily.     metFORMIN 500 MG tablet  Commonly known as:  GLUCOPHAGE  Take 1 tablet (500 mg total) by mouth 2 (two) times daily with a meal.     metoprolol succinate 100 MG 24 hr tablet  Commonly known as:  TOPROL-XL  Take 1 tablet (100 mg total) by mouth daily. Take with or immediately following a meal.     nitroGLYCERIN 0.4 MG SL tablet  Commonly known as:  NITROSTAT  Place 1 tablet (0.4 mg total) under the tongue every 5 (five) minutes as needed for chest pain.     oxyCODONE-acetaminophen 5-325 MG per tablet  Commonly known as:  PERCOCET/ROXICET  Take 1-2 tablets by mouth every 4 (four) hours as needed for moderate pain or severe pain.     polyethylene glycol packet  Commonly known as:  MIRALAX / GLYCOLAX  Take 17 g by mouth daily.     rosuvastatin 40 MG tablet  Commonly known as:   CRESTOR  Take 1 tablet (40 mg total) by mouth daily.     spironolactone 25 MG tablet  Commonly known as:  ALDACTONE  Take 0.5 tablets (12.5 mg total) by mouth daily.       Follow-up Information   Follow up with [name], MD On 11/06/2013.   Specialty:  Orthopedic Surgery   Contact information:   [address] [place] [address] [phone]       Follow up with Richardson Landry. Schedule an appointment as soon as possible for a visit in 1 week. (Infectious disease clinic)    Specialty:  Infectious Diseases   Contact information:   [place] [phone]       The results of significant diagnostics from this hospitalization (including imaging, microbiology, ancillary and laboratory) are listed below for reference.    Significant Diagnostic Studies: Dg Chest 2 View  10/28/2013   CLINICAL DATA:  Shortness of breath  EXAM: CHEST  2 VIEW  COMPARISON:  10/31/2011  FINDINGS: Mild cardiomegaly, stable from prior. Kerley A and B lines noted. No significant effusion. No asymmetric airspace opacity. No acute osseous findings.  IMPRESSION: Mild interstitial pulmonary edema.   Electronically Signed   By: Jorje Guild M.D.   On: 10/28/2013 23:01   Dg Hand Complete Right  10/28/2013   CLINICAL DATA:  Little finger laceration with pain  EXAM: RIGHT HAND - COMPLETE 3+ VIEW  COMPARISON:  None.  FINDINGS: There is extensive soft tissue swelling of the 5th digit, extending into the dorsal hand. No radiodense foreign body. No osseous erosion or asymmetric joint narrowing to suggest osseous/joint infection. No acute fracture or malalignment.  Remote ulnar and radial styloid process fractures.  Degenerative interphalangeal joint narrowing throughout the digits.  IMPRESSION: No evidence of osseous infection.  No radiodense foreign body.   Electronically Signed   By: Jorje Guild  M.D.   On: 10/28/2013 23:04    Microbiology: Recent Results  (from the past 240 hour(s))  GRAM STAIN     Status: None   Collection Time    10/29/13 12:42 AM      Result Value Range Status   Specimen Description WOUND RIGHT SMALL FINGER INFECTION   Final   Special Requests PATIENT ON FOLLOWING KEFLEX   Final   Gram Stain     Final   Value: FEW SQUAMOUS EPITHELIAL CELLS PRESENT     FEW WBC PRESENT, PREDOMINANTLY PMN     FEW GRAM NEGATIVE RODS     Gram Stain Report Called to,Read Back By and Verified With: DR.WEINGOLD AT 0205 ON 10/29/13 BY HOBBINS, J.   Report Status 10/29/2013 FINAL   Final  ANAEROBIC CULTURE     Status: None   Collection Time    10/29/13 12:42 AM      Result Value Range Status   Specimen Description WOUND RIGHT SMALL FINGER INFECTION   Final   Special Requests PATIENT ON FOLLOWING KEFLEX   Final   Gram Stain     Final   Value: NO WBC SEEN     MODERATE SQUAMOUS EPITHELIAL CELLS PRESENT     NO ORGANISMS SEEN     Performed at [facility]   Culture     Final   Value: NO ANAEROBES ISOLATED; CULTURE IN PROGRESS FOR 5 DAYS     Performed at [facility]   Report Status PENDING   Incomplete  WOUND CULTURE     Status: None   Collection Time    10/29/13 12:42 AM      Result Value Range Status   Specimen Description WOUND RIGHT SMALL FINGER INFECTION   Final   Special Requests PATIENT ON FOLLOWING KEFLEX   Final   Gram Stain     Final   Value: NO WBC SEEN     MODERATE SQUAMOUS EPITHELIAL CELLS PRESENT     RARE GRAM NEGATIVE RODS     Performed at [facility]   Culture     Final   Value: ABUNDANT KLEBSIELLA PNEUMONIAE     Performed at [facility]   Report Status 10/31/2013 FINAL   Final   Organism ID, Bacteria KLEBSIELLA PNEUMONIAE   Final  GRAM STAIN     Status: None   Collection Time    10/29/13 12:48 AM      Result Value Range Status   Specimen Description WOUND RIGHT SMALL FINGER DEEP   Final   Special Requests PATIENT ON FOLLOWING KEFLEX   Final   Gram Stain     Final   Value: RARE  SQUAMOUS EPITHELIAL CELLS PRESENT     RARE WBC PRESENT, PREDOMINANTLY PMN     RARE GRAM NEGATIVE RODS     Gram Stain Report Called to,Read Back By and Verified With: DR. [name] AT 0205 ON 10/29/13 BY HOBBINS, J.   Report Status 10/29/2013 FINAL   Final  ANAEROBIC CULTURE     Status: None   Collection Time    10/29/13 12:48 AM      Result Value Range Status   Specimen Description WOUND RIGHT SMALL FINGER DEEP   Final   Special Requests PATIENT ON FOLLOWING KEFLEX   Final   Gram Stain     Final   Value: FEW WBC PRESENT,BOTH PMN AND MONONUCLEAR     NO SQUAMOUS EPITHELIAL CELLS SEEN     NO ORGANISMS SEEN  Performed at [facility]     Final   Value: NO ANAEROBES ISOLATED; CULTURE IN PROGRESS FOR 5 DAYS     Performed at [facility]   Report Status PENDING   Incomplete  WOUND CULTURE     Status: None   Collection Time    10/29/13 12:48 AM      Result Value Range Status   Specimen Description WOUND RIGHT SMALL FINGER DEEP   Final   Special Requests PATIENT ON FOLLOWING KEFLEX   Final   Gram Stain     Final   Value: NO WBC SEEN     NO SQUAMOUS EPITHELIAL CELLS SEEN     NO ORGANISMS SEEN     Performed at [facility]   Culture     Final   Value: KLEBSIELLA PNEUMONIAE     Performed at [facility]   Report Status 10/31/2013 FINAL   Final   Organism ID, Bacteria KLEBSIELLA PNEUMONIAE   Final  GRAM STAIN     Status: None   Collection Time    10/30/13  5:21 PM      Result Value Range Status   Specimen Description FINGER SMALL RIGHT   Final   Special Requests NONE   Final   Gram Stain     Final   Value: RARE WBC PRESENT, PREDOMINANTLY MONONUCLEAR     NO ORGANISMS SEEN     Gram Stain Report Called to,Read Back By and Verified With: DR. Ander Slade AT 1962 ON 11.18.14 BY LOVE,T.   Report Status 10/30/2013 FINAL   Final  ANAEROBIC CULTURE     Status: None   Collection Time    10/30/13  5:21 PM      Result Value Range Status   Specimen  Description FINGER SMALL RIGHT   Final   Special Requests NONE   Final   Gram Stain     Final   Value: RARE WBC PRESENT, PREDOMINANTLY PMN     NO SQUAMOUS EPITHELIAL CELLS SEEN     NO ORGANISMS SEEN     Performed at [facility]   Culture     Final   Value: NO ANAEROBES ISOLATED; CULTURE IN PROGRESS FOR 5 DAYS     Performed at [facility]   Report Status PENDING   Incomplete  CULTURE, ROUTINE-ABSCESS     Status: None   Collection Time    10/30/13  5:21 PM      Result Value Range Status   Specimen Description FINGER SMALL RIGHT   Final   Special Requests NONE   Final   Gram Stain     Final   Value: RARE WBC PRESENT, PREDOMINANTLY PMN     NO SQUAMOUS EPITHELIAL CELLS SEEN     NO ORGANISMS SEEN     Performed at [facility]   Culture     Final   Value: MODERATE KLEBSIELLA PNEUMONIAE     Performed at [facility]   Report Status 11/02/2013 FINAL   Final   Organism ID, Bacteria KLEBSIELLA PNEUMONIAE   Final  GRAM STAIN     Status: None   Collection Time    10/30/13  5:49 PM      Result Value Range Status   Specimen Description ABSCESS RIGHT 5TH FINGER DORSAL   Final   Special Requests NONE   Final   Gram Stain     Final   Value: FEW WBC PRESENT,BOTH PMN AND  MONONUCLEAR     NO ORGANISMS SEEN     Gram Stain Report Called to,Read Back By and Verified With: PLATT,J. AT 0932 ON 11.18.14 BY LOVE,T.   Report Status 10/30/2013 FINAL   Final  ANAEROBIC CULTURE     Status: None   Collection Time    10/30/13  5:49 PM      Result Value Range Status   Specimen Description ABSCESS RIGHT 5TH FINGER DORSAL   Final   Special Requests NONE   Final   Gram Stain     Final   Value: FEW WBC PRESENT, PREDOMINANTLY PMN     NO SQUAMOUS EPITHELIAL CELLS SEEN     NO ORGANISMS SEEN     Performed at [facility]   Culture     Final   Value: NO ANAEROBES ISOLATED; CULTURE IN PROGRESS FOR 5 DAYS     Performed at [facility]   Report Status PENDING    Incomplete  CULTURE, ROUTINE-ABSCESS     Status: None   Collection Time    10/30/13  5:49 PM      Result Value Range Status   Specimen Description ABSCESS RIGHT 5TH FINGER DORSAL   Final   Special Requests NONE   Final   Gram Stain     Final   Value: FEW WBC PRESENT, PREDOMINANTLY PMN     NO SQUAMOUS EPITHELIAL CELLS SEEN     NO ORGANISMS SEEN     Performed at [facility]   Culture     Final   Value: MODERATE KLEBSIELLA PNEUMONIAE     Performed at [facility]   Report Status 11/02/2013 FINAL   Final   Organism ID, Bacteria KLEBSIELLA PNEUMONIAE   Final     Labs: Basic Metabolic Panel:  Recent Labs Lab 10/28/13 2300 10/30/13 0400  NA 131* 137  K 4.4 3.5  CL 94* 100  CO2 24 26  GLUCOSE 529* 182*  BUN 22 15  CREATININE 1.21 1.11  CALCIUM 9.4 9.1   Liver Function Tests:  Recent Labs Lab 10/30/13 0400  AST 10  ALT 9  ALKPHOS 109  BILITOT 0.2*  PROT 6.9  ALBUMIN 2.5*   No results found for this basename: LIPASE, AMYLASE,  in the last 168 hours No results found for this basename: AMMONIA,  in the last 168 hours CBC:  Recent Labs Lab 10/28/13 2300 10/30/13 0400  WBC 8.9 6.6  NEUTROABS  --  4.0  HGB 12.4* 11.8*  HCT 37.1* 36.4*  MCV 84.9 85.0  PLT 277 259   Cardiac Enzymes: No results found for this basename: CKTOTAL, CKMB, CKMBINDEX, TROPONINI,  in the last 168 hours BNP: BNP (last 3 results) No results found for this basename: PROBNP,  in the last 8760 hours CBG:  Recent Labs Lab 11/01/13 1516 11/01/13 1829 11/01/13 2157 11/02/13 0735 11/02/13 1142  GLUCAP 158* 158* 316* 152* 228*    Time coordinating discharge: 45 minutes  Signed:  Dalina Samara  [facility] 11/02/2013, 2:08 PM

## 2013-11-02 NOTE — Progress Notes (Signed)
Pt to d/c home. AVS reviewed and "My Chart" discussed with pt. Pt capable of verbalizing medications and follow-up appointments. Remains hemodynamically stable. No signs and symptoms of distress. Educated pt to return to ER in the case of SOB, dizziness, or chest pain.

## 2013-11-02 NOTE — Op Note (Signed)
NAMELEM, PEARY NO.:  [id number]  MEDICAL RECORD NO.:  [id number]  LOCATION:  5701                         FACILITY:  [facility]  PHYSICIAN:  Sheral Apley. Liboria Putnam, M.D.DATE OF BIRTH:  08/12/1956  DATE OF PROCEDURE:  11/01/2013 DATE OF DISCHARGE:                              OPERATIVE REPORT   PREOPERATIVE DIAGNOSIS:  Severe infection, right small finger.  POSTOPERATIVE DIAGNOSIS:  Severe infection, right small finger.  PROCEDURE:  Repeat incision and drainage with proximal phalangeal level amputation, right small finger.  SURGEON:  Sheral Apley. [name], M.D.  ASSISTANT:  None.  ANESTHESIA:  General.  COMPLICATION:  No complication.  DRAINS:  No drains.  SPECIMEN:  One specimen sent.  DESCRIPTION OF PROCEDURE:  The patient was taken to the operating suite. After induction of adequate general anesthesia, right upper extremity was prepped and draped in usual sterile fashion.  Once this was done, 3 L of normal saline was used to irrigate the infected right small finger. After that was done, there was a clear demarcation of devitalized tissue, dorsally from the level of the middle phalanx palmarly to the level of the proximal phalangeal joint.  We then used an Esmarch to exsanguinate the limb.  Tourniquet was inflated to 250 mmHg.  A 15 blade was then used to carefully incise the interval between the viable and nonviable skin.  We disarticulated the proximal interphalangeal joint, and carefully folate the viable skin dorsally and volarly.  Once this was done, there was skin to the level of the proximal interphalangeal joint flexion crease palmarly and a flap dorsally.  We then primarily closed the tissue with 4-0 nylon including a dorsal flap the we advanced over volarly to cover the head of the proximal phalanx.  The patient was then placed in sterile dressing of 4x4s, fluffs, and a compression Coban wrap.  The patient tolerated the procedure well, went  to the recovery room in stable fashion.    Sheral Apley [name], M.D.    MAW/MEDQ  D:  11/01/2013  T:  11/02/2013  Job:  [id number]

## 2013-11-03 LAB — ANAEROBIC CULTURE: Gram Stain: NONE SEEN

## 2013-11-04 LAB — ANAEROBIC CULTURE

## 2013-11-05 NOTE — Anesthesia Postprocedure Evaluation (Signed)
Anesthesia Post Note  Patient: Joshua [name]  Procedure(s) Performed: Procedure(s) (LRB):  IRRIGATION AND DEBRIDEMENT RIGHT  PROXIMAL PHALANGEAL LEVEL AMPUTATION (Right)  Anesthesia type: General  Patient location: PACU  Post pain: Pain level controlled  Post assessment: Post-op Vital signs reviewed  Last Vitals:  Filed Vitals:   11/02/13 1428  BP: 130/82  Pulse: 68  Temp: 36.8 C  Resp: 16    Post vital signs: Reviewed  Level of consciousness: sedated  Complications: No apparent anesthesia complications

## 2013-11-10 ENCOUNTER — Encounter: Payer: Self-pay | Admitting: Cardiovascular Disease

## 2013-11-10 NOTE — Progress Notes (Signed)
Patient ID: Joshua [name], male   DOB: [birth year], 57 y.o.   MRN: [id number]     HPI: Joshua [name] is a 57 y.o. male who presents to the office for six-month cardiology evaluation.  Joshua [name] suffered an acute coronary syndrome on 10/31/2011 and presented with ECG findings of anterolateral ST segment elevation. 2 catheterization revealing 99% mid LAD stenosis proximal to this there was a 60-70% stenosis after the first diagonal vessel. He also had 9095% mid RCA stenosis and 30% circumflex stenosis. He underwent initial acute intervention to his LAD and a 3.0x32 mm Promus DES stent was inserted. 3 days later he underwent successful staged PCI to his 95% RCA stenosis and a 3.0x18 mm Resolute DES stent was inserted and post dilated 3.5 mm.  Additional problems include at least a 30 year history of hypertension, a 25 year history of diabetes mellitus, obesity, as well as hyperlipidemia. The patient has worked for [employer] and is in record. He denies any recent episodes of chest pain. He did express one episode of lightheadedness unassociated with any palpitations per time she does know some GERD symptoms he is unaware of palpitations. He also has obstructive sleep apnea and is on CPAP therapy.  Past Medical History  Diagnosis Date  . Uncontrolled diabetes mellitus with complications   . Hypertension   . Hyperlipemia   . GERD (gastroesophageal reflux disease)   . Diabetic gastroparesis   . Polyneuropathy in diabetes(357.2)     Past Surgical History  Procedure Laterality Date  . Breast lumpectomy    . Incision and drainage Right 10/28/2013    Procedure: INCISION AND DRAINAGE Right small finger;  Surgeon: [name], MD;  Location: [facility];  Service: Orthopedics;  Laterality: Right;  . I&d extremity Right 10/30/2013    Procedure: IRRIGATION AND DEBRIDEMENT RIGHT SMALL FINGER POSSIBLE SECONDARY WOUND CLOSURE;  Surgeon: [name], MD;  Location: [facility];  Service:  Orthopedics;  Laterality: Right;  [name] is available after 4pm  . I&d extremity Right 11/01/2013    Procedure:  IRRIGATION AND DEBRIDEMENT RIGHT  PROXIMAL PHALANGEAL LEVEL AMPUTATION;  Surgeon: [name], MD;  Location: [facility];  Service: Orthopedics;  Laterality: Right;    No Known Allergies  Current Outpatient Prescriptions  Medication Sig Dispense Refill  . allopurinol (ZYLOPRIM) 300 MG tablet Take 300 mg by mouth daily.       . clopidogrel (PLAVIX) 75 MG tablet Take 1 tablet by mouth daily.      . hydrochlorothiazide (HYDRODIURIL) 25 MG tablet Take 0.5 tablets by mouth daily.      [name] amlodipine-olmesartan (AZOR) 10-20 MG per tablet Take 1 tablet by mouth daily. Azor 10/$RemoveBefore'40mg'[id number]$  once a day  30 tablet  0  . esomeprazole (NEXIUM) 40 MG capsule Take 1 capsule (40 mg total) by mouth daily.  30 capsule  0  . insulin NPH-regular (HUMULIN 70/30) (70-30) 100 UNIT/ML injection Inject 40 Units into the skin 2 (two) times daily.  10 mL  12  . levofloxacin (LEVAQUIN) 750 MG tablet Take 1 tablet (750 mg total) by mouth daily.  30 tablet  0  . metFORMIN (GLUCOPHAGE) 500 MG tablet Take 1 tablet (500 mg total) by mouth 2 (two) times daily with a meal.      . metoprolol succinate (TOPROL-XL) 100 MG 24 hr tablet Take 1 tablet (100 mg total) by mouth daily. Take with or immediately following a meal.  30 tablet  6  . nitroGLYCERIN (NITROSTAT) 0.4  MG SL tablet Place 1 tablet (0.4 mg total) under the tongue every 5 (five) minutes as needed for chest pain.  25 tablet  3  . oxyCODONE-acetaminophen (PERCOCET/ROXICET) 5-325 MG per tablet Take 1-2 tablets by mouth every 4 (four) hours as needed for moderate pain or severe pain.  45 tablet  0  . polyethylene glycol (MIRALAX / GLYCOLAX) packet Take 17 g by mouth daily.  100 each  0  . rosuvastatin (CRESTOR) 40 MG tablet Take 1 tablet (40 mg total) by mouth daily.      [name] spironolactone (ALDACTONE) 25 MG tablet Take 0.5 tablets (12.5 mg total) by mouth daily.  30  tablet  9   No current facility-administered medications for this visit.    History   Social History  . Marital Status: Married    Spouse Name: N/A    Number of Children: N/A  . Years of Education: N/A   Occupational History  . Not on file.   Social History Main Topics  . Smoking status: Never Smoker   . Smokeless tobacco: Never Used  . Alcohol Use: 10.8 oz/week    18 Cans of beer per week  . Drug Use: No  . Sexual Activity: Yes   Other Topics Concern  . Not on file   Social History Narrative  . No narrative on file    History reviewed. No pertinent family history.  ROS is negative for fevers, chills or night sweats. He denies rash. He denies change in vision. He denies reduction in hearing. He is unaware of any PND or orthopnea. He denies cough or increased sputum production or wheezing. He denies recurrent anginal symptoms. He denies presyncope or syncope. At times he does note some constipation he also has some GERD symptoms. He denies significant leg swelling. He denies any significant weight loss. There is no claudication. He denies myalgias. He does have diabetes. There is no history of thyroid abnormalities. He is unaware of residual daytime sleepiness or breakthrough snoring on CPAP therapy.  Other comprehensive 12 point system review is negative.  PE BP 156/88  Pulse 90  Ht $R'5\' 9"'Aj$  (1.753 m)  Wt 282 lb 12.8 oz (128.277 kg)  BMI 41.74 kg/m2  Body mass index concordant with morbid obesity. General: Alert, oriented, no distress.  Skin: normal turgor, no rashes HEENT: Normocephalic, atraumatic. Pupils round and reactive; sclera anicteric;no lid lag.  Nose without nasal septal hypertrophy Mouth/Parynx benign; Mallinpatti scale 3/4 Neck: No JVD, no carotid briuts Lungs: clear to ausculatation and percussion; no wheezing or rales Heart: RRR, s1 s2 normal 1/6 systolic murmur, unchanged. Abdomen: Moderate central adiposity. soft, nontender; no hepatosplenomehaly, BS+;  abdominal aorta nontender and not dilated by palpation. Pulses 2+ Extremities: no clubbing cyanosis or edema, Homan's sign negative  Neurologic: grossly nonfocal Psychologic: normal affect and mood.  ECG: Normal sinus rhythm at 90 beats per minute. Poor progression V1 through V4. Normal intervals.  LABS:  BMET    Component Value Date/Time   NA 137 10/30/2013 0400   K 3.5 10/30/2013 0400   CL 100 10/30/2013 0400   CO2 26 10/30/2013 0400   GLUCOSE 182* 10/30/2013 0400   BUN 15 10/30/2013 0400   CREATININE 1.11 10/30/2013 0400   CREATININE 1.36* 10/09/2013 1215   CALCIUM 9.1 10/30/2013 0400   GFRNONAA 73* 10/30/2013 0400   GFRAA 85* 10/30/2013 0400     Hepatic Function Panel     Component Value Date/Time   PROT 6.9 10/30/2013 0400   ALBUMIN  2.5* 10/30/2013 0400   AST 10 10/30/2013 0400   ALT 9 10/30/2013 0400   ALKPHOS 109 10/30/2013 0400   BILITOT 0.2* 10/30/2013 0400     CBC    Component Value Date/Time   WBC 6.6 10/30/2013 0400   RBC 4.28 10/30/2013 0400   HGB 11.8* 10/30/2013 0400   HCT 36.4* 10/30/2013 0400   PLT 259 10/30/2013 0400   MCV 85.0 10/30/2013 0400   MCH 27.6 10/30/2013 0400   MCHC 32.4 10/30/2013 0400   RDW 13.4 10/30/2013 0400   LYMPHSABS 1.9 10/30/2013 0400   MONOABS 0.6 10/30/2013 0400   EOSABS 0.1 10/30/2013 0400   BASOSABS 0.0 10/30/2013 0400     BNP No results found for this basename: probnp    Lipid Panel     Component Value Date/Time   CHOL 321* 10/09/2013 1215   TRIG 302* 10/09/2013 1215   HDL 68 10/09/2013 1215   CHOLHDL 4.7 10/09/2013 1215   VLDL 60* 10/09/2013 1215   LDLCALC 193* 10/09/2013 1215     RADIOLOGY: Dg Chest 2 View  10/28/2013   CLINICAL DATA:  Shortness of breath  EXAM: CHEST  2 VIEW  COMPARISON:  10/31/2011  FINDINGS: Mild cardiomegaly, stable from prior. Kerley A and B lines noted. No significant effusion. No asymmetric airspace opacity. No acute osseous findings.  IMPRESSION: Mild interstitial  pulmonary edema.   Electronically Signed   By: Jorje Guild M.D.   On: 10/28/2013 23:01   Dg Hand Complete Right  10/28/2013   CLINICAL DATA:  Little finger laceration with pain  EXAM: RIGHT HAND - COMPLETE 3+ VIEW  COMPARISON:  None.  FINDINGS: There is extensive soft tissue swelling of the 5th digit, extending into the dorsal hand. No radiodense foreign body. No osseous erosion or asymmetric joint narrowing to suggest osseous/joint infection. No acute fracture or malalignment.  Remote ulnar and radial styloid process fractures.  Degenerative interphalangeal joint narrowing throughout the digits.  IMPRESSION: No evidence of osseous infection.  No radiodense foreign body.   Electronically Signed   By: Jorje Guild M.D.   On: 10/28/2013 23:04      ASSESSMENT AND PLAN: Joshua [name] is now 57 years old. He is almost 2 years following his acute coronary syndrome and anterolateral ST segment elevation myocardial infarction. At that time he underwent stenting to his subtotaled LAD stenosis and 3 days later underwent staged intervention to a high grade mid right coronary artery stenosis. A nuclear perfusion study in January 2013 was low risk and did show small area of anteroseptal scar towards the apex without associated ischemia. Presently, I'm recommending he discontinue his metoprolol tartrate which she has been taking 50 mg in the morning and 25 mg at night. I am changing this to metoprolol succinate 100 mg daily which should provide both improved blood pressure as well as heart rate control since his resting pulse is now 90. We discussed the importance of significant weight reduction. I also recommended MiraLax for his intermittent constipation. I will see him in 6 months for cardiology reassessment.     [name], MD, [facility]  11/10/2013 11:26 AM

## 2013-11-12 ENCOUNTER — Encounter: Payer: Self-pay | Admitting: Cardiovascular Disease

## 2013-11-13 ENCOUNTER — Ambulatory Visit (INDEPENDENT_AMBULATORY_CARE_PROVIDER_SITE_OTHER): Payer: Worker's Compensation | Admitting: Internal Medicine

## 2013-11-13 ENCOUNTER — Encounter: Payer: Self-pay | Admitting: Internal Medicine

## 2013-11-13 VITALS — BP 153/87 | HR 75 | Temp 98.2°F | Wt 278.0 lb

## 2013-11-13 DIAGNOSIS — A498 Other bacterial infections of unspecified site: Secondary | ICD-10-CM

## 2013-11-13 DIAGNOSIS — E1165 Type 2 diabetes mellitus with hyperglycemia: Secondary | ICD-10-CM

## 2013-11-13 DIAGNOSIS — L089 Local infection of the skin and subcutaneous tissue, unspecified: Secondary | ICD-10-CM

## 2013-11-13 DIAGNOSIS — B999 Unspecified infectious disease: Secondary | ICD-10-CM

## 2013-11-13 DIAGNOSIS — B961 Klebsiella pneumoniae [K. pneumoniae] as the cause of diseases classified elsewhere: Secondary | ICD-10-CM

## 2013-11-13 NOTE — Progress Notes (Signed)
Subjective:    Patient ID: Joshua [name], male    DOB: Apr 26, 1956, 56 y.o.   MRN: [id number]  HPI Joshua [name]  Is a 57yo AAM with uncontrolled DM with hbA1c of 16 c/b diabetis gastroparesis. was hospitalized in late november with severe infection of the right hand fifth finger, worker's comp related incidence. 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 Joshua [name] underwent incision and drainage on 11/18 and was started on vancomycin.  gram stain demonstrated gram negative rods, however, so Joshua [name] was transitioned to unasyn. OR cx grew Klebsiella pneumonia ( Amp R but otherwise pan sensitive) . Joshua [name] returned to the OR on 11/18 which demonstrated extension of infection into the dorsal extensor sheath s/p  I x D. Due to increasing fever, underwent amputation at PIP level on 11/20. all Cultures grew Klebsiella.Joshua [name] was discharged on levofloxacin x 4 wks and following up with dr. [name] and ID clinic. Joshua [name] last saw Dr. [name] last week who states that his hand is healing appropriately to recommend doing twice a day dressing changes. The patient continues to take his antibiotics and insulin as directed. His BS are under better control in the 170s. Joshua [name] has not re-established with endocrinologist. Joshua [name] denies any extending redness to right hand or 5th finger, no fever, chills, nightsweats, diarrhea, nausea or vomiting. Joshua [name] states that in the past Joshua [name] had difficulty with high copay of his insulin and thus unable to afford getting medications. Now that Joshua [name] has access to medications, Joshua [name] is using them as directed    No Known Allergies  Current Outpatient Prescriptions on File Prior to Visit  Medication Sig Dispense Refill  . allopurinol (ZYLOPRIM) 300 MG tablet Take 300 mg by mouth daily.       [name] amlodipine-olmesartan (AZOR) 10-20 MG per tablet Take 1 tablet by mouth daily. Azor 10/$RemoveBefore'40mg'[id number]$  once a day  30 tablet  0  . clopidogrel (PLAVIX) 75 MG tablet Take 1 tablet by mouth daily.      . hydrochlorothiazide  (HYDRODIURIL) 25 MG tablet Take 0.5 tablets by mouth daily.      . insulin NPH-regular (HUMULIN 70/30) (70-30) 100 UNIT/ML injection Inject 40 Units into the skin 2 (two) times daily.  10 mL  12  . levofloxacin (LEVAQUIN) 750 MG tablet Take 1 tablet (750 mg total) by mouth daily.  30 tablet  0  . metFORMIN (GLUCOPHAGE) 500 MG tablet Take 1 tablet (500 mg total) by mouth 2 (two) times daily with a meal.      . metoprolol succinate (TOPROL-XL) 100 MG 24 hr tablet Take 1 tablet (100 mg total) by mouth daily. Take with or immediately following a meal.  30 tablet  6  . nitroGLYCERIN (NITROSTAT) 0.4 MG SL tablet Place 1 tablet (0.4 mg total) under the tongue every 5 (five) minutes as needed for chest pain.  25 tablet  3  . oxyCODONE-acetaminophen (PERCOCET/ROXICET) 5-325 MG per tablet Take 1-2 tablets by mouth every 4 (four) hours as needed for moderate pain or severe pain.  45 tablet  0  . polyethylene glycol (MIRALAX / GLYCOLAX) packet Take 17 g by mouth daily.  100 each  0  . rosuvastatin (CRESTOR) 40 MG tablet Take 1 tablet (40 mg total) by mouth daily.      [name] spironolactone (ALDACTONE) 25 MG tablet Take 0.5 tablets (12.5 mg total) by mouth daily.  30 tablet  9  . esomeprazole (NEXIUM) 40 MG capsule Take 1 capsule (40 mg total)  by mouth daily.  30 capsule  0   No current facility-administered medications on file prior to visit.   History   Social History  . Marital Status: Married    Spouse Name: N/A    Number of Children: N/A  . Years of Education: N/A   Occupational History  . Not on file.   Social History Main Topics  . Smoking status: Never Smoker   . Smokeless tobacco: Never Used  . Alcohol Use: 10.8 oz/week    18 Cans of beer per week  . Drug Use: No  . Sexual Activity: Yes   Other Topics Concern  . Not on file   Social History Narrative  . No narrative on file   Family hx: DM, CAD  Review of Systems  Constitutional: Negative for fever, chills, diaphoresis, activity  change, appetite change, fatigue and unexpected weight change.  HENT: Negative for congestion, sore throat, rhinorrhea, sneezing, trouble swallowing and sinus pressure.  Eyes: Negative for photophobia and visual disturbance.  Respiratory: Negative for cough, chest tightness, shortness of breath, wheezing and stridor.  Cardiovascular: Negative for chest pain, palpitations and leg swelling.  Gastrointestinal:  Decrease appetite due to gastroparesis, not currently on reglan. Negative for nausea, vomiting, abdominal pain, diarrhea, constipation, blood in stool, abdominal distention and anal bleeding.  Genitourinary: Negative for dysuria, hematuria, flank pain and difficulty urinating.  Musculoskeletal: Negative for myalgias, back pain, joint swelling, arthralgias and gait problem.  Skin: per hpi Neurological: Negative for dizziness, tremors, weakness and light-headedness.  Hematological: Negative for adenopathy. Does not bruise/bleed easily.  Psychiatric/Behavioral: Negative for behavioral problems, confusion, sleep disturbance, dysphoric mood, decreased concentration and agitation.        Objective:   Physical Exam BP 153/87  Pulse 75  Temp(Src) 98.2 F (36.8 C) (Oral)  Wt 278 lb (126.1 kg) Physical Exam  Constitutional: Joshua [name] is oriented to person, place, and time. Joshua [name] appears well-developed and well-nourished. No distress.  HENT:  Mouth/Throat: Oropharynx is clear and moist. No oropharyngeal exudate.  Cardiovascular: Normal rate, regular rhythm and normal heart sounds. Exam reveals no gallop and no friction rub.  No murmur heard.  Pulmonary/Chest: Effort normal and breath sounds normal. No respiratory distress. Joshua [name] has no wheezes.  Abdominal: Soft. Bowel sounds are normal. Joshua [name] exhibits no distension. There is no tenderness.  Lymphadenopathy:  no cervical adenopathy.  Ext: right hand,5th digit with PIP amputation. Remaining stump still has sutures in place. Distal aspect still moist with  fibrinous exudate Neurological: Joshua [name] is alert and oriented to person, place, and time.  Skin: Skin is warm and dry. No rash noted. No erythema.  Psychiatric: Joshua [name] has a normal mood and affect. His behavior is normal.   Labs: Lab Results  Component Value Date   WBC 6.6 10/30/2013   HGB 11.8* 10/30/2013   HCT 36.4* 10/30/2013   MCV 85.0 10/30/2013   PLT 259 10/30/2013   BMET    Component Value Date/Time   NA 137 10/30/2013 0400   K 3.5 10/30/2013 0400   CL 100 10/30/2013 0400   CO2 26 10/30/2013 0400   GLUCOSE 182* 10/30/2013 0400   BUN 15 10/30/2013 0400   CREATININE 1.11 10/30/2013 0400   CREATININE 1.36* 10/09/2013 1215   CALCIUM 9.1 10/30/2013 0400   GFRNONAA 73* 10/30/2013 0400   GFRAA 85* 10/30/2013 0400   Micro: 11/18 OR culture Organism ID, Bacteria KLEBSIELLA PNEUMONIAE    Resulting Agency [facility]   Culture & Susceptibility    Antibiotic  Organism Organism  Organism     KLEBSIELLA PNEUMONIAE     AMPICILLIN  >=32 RESISTANT R Final       AMPICILLIN/SULBACTAM  4 SENSITIVE S Final       CEFAZOLIN  <=4 SENSITIVE S Final       CEFEPIME  <=1 SENSITIVE S Final       CEFOXITIN  <=4 SENSITIVE S Final       CEFTAZIDIME  <=1 SENSITIVE S Final       CEFTRIAXONE  <=1 SENSITIVE S Final       CIPROFLOXACIN  <=0.25 SENSITIVE S Final       GENTAMICIN  <=1 SENSITIVE S Final       IMIPENEM  <=0.25 SENSITIVE S Final       PIP/TAZO  <=4 SENSITIVE S Final       TOBRAMYCIN  <=1 SENSITIVE S Final       TRIMETH/SULFA  <=20 SENSITIVE S Final          Xray:  FINDINGS: hand xray 11/16 There is extensive soft tissue swelling of the 5th digit, extending  into the dorsal hand. No radiodense foreign body. No osseous erosion  or asymmetric joint narrowing to suggest osseous/joint infection. No  acute fracture or malalignment.  Remote ulnar and radial styloid process fractures.  Degenerative interphalangeal joint narrowing throughout the digits.  IMPRESSION:  No evidence of osseous  infection. No radiodense foreign body.      Assessment & Plan:  Klebsiella pneumonia deep tissue infection of right 5th finger = continue to finish out 4 wk course of levofloxacin. Currently day #14 of 30. Continue with Wound care dressing changes twice a day per Dr. Bertis Ruddy instructions.  Diabetes mellitus, poorly controlled = continue with current regimen. Gave him name of an endocrinologist Joshua [name] can ask pcp to refer for help on his DM   rtc in 2 wks.  Cc: we[facility]old

## 2013-11-27 ENCOUNTER — Ambulatory Visit (INDEPENDENT_AMBULATORY_CARE_PROVIDER_SITE_OTHER): Payer: Worker's Compensation | Admitting: Internal Medicine

## 2013-11-27 ENCOUNTER — Encounter: Payer: Self-pay | Admitting: Internal Medicine

## 2013-11-27 VITALS — BP 168/92 | HR 93 | Temp 98.2°F | Wt 289.0 lb

## 2013-11-27 DIAGNOSIS — L089 Local infection of the skin and subcutaneous tissue, unspecified: Secondary | ICD-10-CM | POA: Diagnosis not present

## 2013-11-27 MED ORDER — LEVOFLOXACIN 750 MG PO TABS: 750.0000 mg | ORAL_TABLET | Freq: Every day | ORAL | Status: DC

## 2013-11-27 NOTE — Progress Notes (Signed)
Subjective:    Patient ID: Joshua [name], male    DOB: [birth year], 57 y.o.   MRN: [id number]  HPI 57yo M with uncontrolled DM who sustained right 5th finger injury at work that subsequently had GNR infection and osteomyelitis. He underwent incision and drainage on 11/18 and was started on vancomycin. gram stain demonstrated gram negative rods, however, so he was transitioned to unasyn. OR cx grew Klebsiella pneumonia ( Amp R but otherwise pan sensitive) . He returned to the OR on 11/18 which demonstrated extension of infection into the dorsal extensor sheath s/p I x D. Due to increasing fever, underwent amputation at PIP level on 11/20. all Cultures grew Klebsiella.he was discharged on levofloxacin x 4 wks and following up today near completion of 4 wks. he still has residual ulcer, poorly healing than one would expect 4 wks out of surgery.  Current Outpatient Prescriptions on File Prior to Visit  Medication Sig Dispense Refill  . allopurinol (ZYLOPRIM) 300 MG tablet Take 300 mg by mouth daily.       [name] amlodipine-olmesartan (AZOR) 10-20 MG per tablet Take 1 tablet by mouth daily. Azor 10/$RemoveBefore'40mg'[id number]$  once a day  30 tablet  0  . clopidogrel (PLAVIX) 75 MG tablet Take 1 tablet by mouth daily.      . hydrochlorothiazide (HYDRODIURIL) 25 MG tablet Take 0.5 tablets by mouth daily.      . insulin NPH-regular (HUMULIN 70/30) (70-30) 100 UNIT/ML injection Inject 40 Units into the skin 2 (two) times daily.  10 mL  12  . metFORMIN (GLUCOPHAGE) 500 MG tablet Take 1 tablet (500 mg total) by mouth 2 (two) times daily with a meal.      . metoprolol succinate (TOPROL-XL) 100 MG 24 hr tablet Take 1 tablet (100 mg total) by mouth daily. Take with or immediately following a meal.  30 tablet  6  . nitroGLYCERIN (NITROSTAT) 0.4 MG SL tablet Place 1 tablet (0.4 mg total) under the tongue every 5 (five) minutes as needed for chest pain.  25 tablet  3  . oxyCODONE-acetaminophen (PERCOCET/ROXICET) 5-325 MG per tablet Take 1-2  tablets by mouth every 4 (four) hours as needed for moderate pain or severe pain.  45 tablet  0  . polyethylene glycol (MIRALAX / GLYCOLAX) packet Take 17 g by mouth daily.  100 each  0  . rosuvastatin (CRESTOR) 40 MG tablet Take 1 tablet (40 mg total) by mouth daily.      [name] spironolactone (ALDACTONE) 25 MG tablet Take 0.5 tablets (12.5 mg total) by mouth daily.  30 tablet  9  . esomeprazole (NEXIUM) 40 MG capsule Take 1 capsule (40 mg total) by mouth daily.  30 capsule  0   No current facility-administered medications on file prior to visit.   Active Ambulatory Problems    Diagnosis Date Noted  . DIABETES MELLITUS, TYPE II 01/12/2011  . HYPERLIPIDEMIA 01/12/2011  . HYPERTENSION 01/12/2011  . GERD 01/12/2011  . ACUTE OSTEOMYELITIS, ANKLE AND FOOT 01/12/2011  . STEMI (ST elevation myocardial infarction) 10/31/2011  . Presence of stent in anterior descending branch of left coronary artery 10/31/2011  . Stented coronary artery 11/04/2011  . Abscess of fifth finger 10/29/2013  . Uncontrolled diabetes mellitus with peripheral autonomic neuropathy 10/29/2013  . Uncontrolled diabetes mellitus 10/29/2013  . Hyperglycemia 10/29/2013  . Morbid obesity 11/10/2013   Resolved Ambulatory Problems    Diagnosis Date Noted  . No Resolved Ambulatory Problems   Past Medical History  Diagnosis Date  .  Uncontrolled diabetes mellitus with complications   . Hypertension   . Hyperlipemia   . GERD (gastroesophageal reflux disease)   . Diabetic gastroparesis   . Polyneuropathy in diabetes(357.2)       Review of Systems Review of Systems  Constitutional: Negative for fever, chills, diaphoresis, activity change, appetite change, fatigue and unexpected weight change.  HENT: Negative for congestion, sore throat, rhinorrhea, sneezing, trouble swallowing and sinus pressure.  Eyes: Negative for photophobia and visual disturbance.  Respiratory: Negative for cough, chest tightness, shortness of breath,  wheezing and stridor.  Cardiovascular: Negative for chest pain, palpitations and leg swelling.  Gastrointestinal: Negative for nausea, vomiting, abdominal pain, diarrhea, constipation, blood in stool, abdominal distention and anal bleeding.  Genitourinary: Negative for dysuria, hematuria, flank pain and difficulty urinating.  Musculoskeletal: Negative for myalgias, back pain, joint swelling, arthralgias and gait problem.  Skin: per hpi Neurological: Negative for dizziness, tremors, weakness and light-headedness.  Hematological: Negative for adenopathy. Does not bruise/bleed easily.  Psychiatric/Behavioral: Negative for behavioral problems, confusion, sleep disturbance, dysphoric mood, decreased concentration and agitation.       Objective:   Physical Exam BP 168/92  Pulse 93  Temp(Src) 98.2 F (36.8 C) (Oral)  Wt 289 lb (131.09 kg) gen = a xo by 3 in NAD Skin= 1cm ulcer, clean wound bed but has crusted cap to area  Lab Results  Component Value Date   ESRSEDRATE 30* 11/29/2013   Lab Results  Component Value Date   CRP <0.5 11/29/2013        Assessment & Plan:   = treat for addn 2wks of antibiotics. Check sed rate and crp. Will have him continue antibiotics for 2 more weeks and reassess inflammatory markers and healing of his wound at next visit.

## 2013-11-28 ENCOUNTER — Other Ambulatory Visit: Payer: Self-pay | Admitting: *Deleted

## 2013-11-28 DIAGNOSIS — L03019 Cellulitis of unspecified finger: Secondary | ICD-10-CM

## 2013-11-28 DIAGNOSIS — L02519 Cutaneous abscess of unspecified hand: Secondary | ICD-10-CM

## 2013-11-29 ENCOUNTER — Encounter: Payer: BC Managed Care – PPO | Attending: Internal Medicine | Admitting: *Deleted

## 2013-11-29 ENCOUNTER — Encounter: Payer: Self-pay | Admitting: *Deleted

## 2013-11-29 ENCOUNTER — Other Ambulatory Visit: Payer: BC Managed Care – PPO

## 2013-11-29 VITALS — Ht 69.0 in | Wt 288.7 lb

## 2013-11-29 DIAGNOSIS — Z713 Dietary counseling and surveillance: Secondary | ICD-10-CM | POA: Insufficient documentation

## 2013-11-29 DIAGNOSIS — E119 Type 2 diabetes mellitus without complications: Secondary | ICD-10-CM

## 2013-11-29 DIAGNOSIS — L02519 Cutaneous abscess of unspecified hand: Secondary | ICD-10-CM

## 2013-11-29 DIAGNOSIS — L03019 Cellulitis of unspecified finger: Secondary | ICD-10-CM

## 2013-11-29 LAB — CBC WITH DIFFERENTIAL/PLATELET
Basophils Absolute: 0 10*3/uL (ref 0.0–0.1)
Basophils Relative: 0 % (ref 0–1)
Eosinophils Absolute: 0.2 10*3/uL (ref 0.0–0.7)
Eosinophils Relative: 3 % (ref 0–5)
HCT: 37.6 % — ABNORMAL LOW (ref 39.0–52.0)
Hemoglobin: 12.6 g/dL — ABNORMAL LOW (ref 13.0–17.0)
Lymphocytes Relative: 30 % (ref 12–46)
Lymphs Abs: 1.9 10*3/uL (ref 0.7–4.0)
MCH: 27.5 pg (ref 26.0–34.0)
MCHC: 33.5 g/dL (ref 30.0–36.0)
MCV: 82.1 fL (ref 78.0–100.0)
Monocytes Absolute: 0.6 10*3/uL (ref 0.1–1.0)
Monocytes Relative: 9 % (ref 3–12)
Neutro Abs: 3.5 10*3/uL (ref 1.7–7.7)
Neutrophils Relative %: 58 % (ref 43–77)
Platelets: 164 10*3/uL (ref 150–400)
RBC: 4.58 MIL/uL (ref 4.22–5.81)
RDW: 15.1 % (ref 11.5–15.5)
WBC: 6.1 10*3/uL (ref 4.0–10.5)

## 2013-11-29 LAB — C-REACTIVE PROTEIN: CRP: <0.5 mg/dL (ref <0.60)

## 2013-11-29 LAB — BASIC METABOLIC PANEL
BUN: 23 mg/dL (ref 6–23)
CO2: 23 mEq/L (ref 19–32)
Calcium: 8.8 mg/dL (ref 8.4–10.5)
Chloride: 106 mEq/L (ref 96–112)
Creat: 1.26 mg/dL (ref 0.50–1.35)
Glucose, Bld: 185 mg/dL — ABNORMAL HIGH (ref 70–99)
Potassium: 4 mEq/L (ref 3.5–5.3)
Sodium: 143 mEq/L (ref 135–145)

## 2013-11-29 LAB — SEDIMENTATION RATE: Sed Rate: 30 mm/hr — ABNORMAL HIGH (ref 0–16)

## 2013-11-29 NOTE — Progress Notes (Signed)
Appt start time: 1630 end time:  1800.  Assessment:  Patient was seen on  11/29/13 for individual diabetes education. He works [employer] and [employer], typically from 4 PM to 4 AM alternately. He sleeps from 8 AM to 4 PM during the day, Monday through Friday. Lives with wife, he buys most of his food and prepares due to their alternating schedules. SMBG infrequently reporting range of 118 - 500 mg/dl. He states he forgets his insulin or can't afford it for a few days at a time. Had infection on his right thumb which resulted in an amputation. 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 He is frustrated with his poor control.  Current HbA1c: 16.2%  Preferred Learning Style:   No preference indicated   Learning Readiness:   Ready  Change in progress  MEDICATIONS: see list. Diabetes medications include Humulin 70/30 @ 40 units twice a day and Metformin.   DIETARY INTAKE:  24-hr recall:  B ( AM): PNB crackers with diet coke OR left overs Snk ( AM): none  L ( PM): no meal breaks at work, may get food from vending machine Snk ( PM): none D ( PM): on days off during football season, fried chicken wings OR meat, limited vegetables, brown rice or potato Snk ( PM): none Beverages: diet Coke, water,   Usual physical activity: walks and climbs stairs at work  Estimated energy needs: 2100 calories 235 g carbohydrates 158 g protein 58 g fat    Intervention:  Nutrition counseling provided.  Spent most of this visit discussing diabetes medications and the action of his Humalin 70/30. Also discussed the value of his having access to Regular insulin to provide correction dose when his BG is too high.   Reviewed patient medications.  Discussed role of medication on blood glucose and possible side effects  Discusses Carb Counting as method of portion control and importance of variety of including more food groups on a daily  basis. Suggested 4 Carb Choices per meal and up to 2 per snack if hungry. Discusses ways to bring healthier foods from home instead of eating from the vending machine at work.   Discussed blood glucose monitoring and interpretation.  Discussed recommended target ranges and individual ranges.    Discussed role of stress on blood glucose levels and discussed strategies to manage psychosocial issues.  Provided information on recommendations for long-term diabetes self-care.  Established checklist for medical, dental, and emotional self-care.  Plan:  Aim for 4 Carb Choices per meal (60 grams) +/- 1 either way  Aim for 0-2 Carbs per snack if hungry  Consider talking with your MD about a Rx for Regular insulin that can be used as a sliding scale to enable you to correct high blood sugars.  Teaching Method Utilized: all of the following: Visual Auditory Hands on  Handouts given during visit include: Carb Counting and Food Label handouts Meal Plan Card  Insulin action handout  Barriers to learning/adherence to lifestyle change: heavy work schedule and frustration of uncontrolled diabetes with evolving complications.  Diabetes self-care support plan:   [facility] support group  Ongoing education  Demonstrated degree of understanding via:  Teach Back   Monitoring/Evaluation:  Dietary intake, exercise, potential for added insulin for correction, and body weight in 6 week(s).

## 2013-11-29 NOTE — Patient Instructions (Signed)
Plan:  Aim for 4 Carb Choices per meal (60 grams) +/- 1 either way  Aim for 0-2 Carbs per snack if hungry  Consider talking with your MD about a Rx for Regular insulin that can be used as a sliding scale to enable you to correct high blood sugars.

## 2013-12-20 ENCOUNTER — Ambulatory Visit (INDEPENDENT_AMBULATORY_CARE_PROVIDER_SITE_OTHER): Payer: Worker's Compensation | Admitting: Internal Medicine

## 2013-12-20 VITALS — BP 169/99 | HR 79 | Temp 98.1°F | Ht 69.0 in | Wt 298.5 lb

## 2013-12-20 DIAGNOSIS — L02519 Cutaneous abscess of unspecified hand: Secondary | ICD-10-CM

## 2013-12-20 DIAGNOSIS — L02511 Cutaneous abscess of right hand: Secondary | ICD-10-CM

## 2013-12-20 DIAGNOSIS — L03019 Cellulitis of unspecified finger: Secondary | ICD-10-CM

## 2013-12-20 LAB — C-REACTIVE PROTEIN: CRP: <0.5 mg/dL (ref <0.60)

## 2013-12-20 LAB — SEDIMENTATION RATE: Sed Rate: 15 mm/hr (ref 0–16)

## 2013-12-20 NOTE — Progress Notes (Signed)
Patient ID: Joshua [name], male   DOB: [birth year], 58 y.o.   MRN: [id number]         [facility]  Patient Active Problem List   Diagnosis Date Noted  . Abscess of fifth finger 10/29/2013    Priority: High  . Morbid obesity 11/10/2013  . Uncontrolled diabetes mellitus with peripheral autonomic neuropathy 10/29/2013  . Uncontrolled diabetes mellitus 10/29/2013  . Hyperglycemia 10/29/2013  . Stented coronary artery 11/04/2011  . STEMI (ST elevation myocardial infarction) 10/31/2011  . Presence of stent in anterior descending branch of left coronary artery 10/31/2011  . DIABETES MELLITUS, TYPE II 01/12/2011  . HYPERLIPIDEMIA 01/12/2011  . HYPERTENSION 01/12/2011  . GERD 01/12/2011  . ACUTE OSTEOMYELITIS, ANKLE AND FOOT 01/12/2011    Patient's Medications  New Prescriptions   No medications on file  Previous Medications   ALLOPURINOL (ZYLOPRIM) 300 MG TABLET    Take 300 mg by mouth daily.    AMLODIPINE-OLMESARTAN (AZOR) 10-20 MG PER TABLET    Take 1 tablet by mouth daily. Azor 10/$RemoveBefore'40mg'EkrwYoNhxifPR$  once a day   CLOPIDOGREL (PLAVIX) 75 MG TABLET    Take 1 tablet by mouth daily.   ESOMEPRAZOLE (NEXIUM) 40 MG CAPSULE    Take 1 capsule (40 mg total) by mouth daily.   HYDROCHLOROTHIAZIDE (HYDRODIURIL) 25 MG TABLET    Take 0.5 tablets by mouth daily.   INSULIN NPH-REGULAR (HUMULIN 70/30) (70-30) 100 UNIT/ML INJECTION    Inject 40 Units into the skin 2 (two) times daily.   LEVOFLOXACIN (LEVAQUIN) 750 MG TABLET    Take 1 tablet (750 mg total) by mouth daily.   METFORMIN (GLUCOPHAGE) 500 MG TABLET    Take 1 tablet (500 mg total) by mouth 2 (two) times daily with a meal.   METOPROLOL SUCCINATE (TOPROL-XL) 100 MG 24 HR TABLET    Take 1 tablet (100 mg total) by mouth daily. Take with or immediately following a meal.   NITROGLYCERIN (NITROSTAT) 0.4 MG SL TABLET    Place 1 tablet (0.4 mg total) under the tongue every 5 (five) minutes as needed for chest pain.   OXYCODONE-ACETAMINOPHEN (PERCOCET/ROXICET) 5-325 MG PER TABLET    Take 1-2 tablets by mouth every 4 (four) hours as needed for moderate pain or severe pain.   POLYETHYLENE GLYCOL (MIRALAX / GLYCOLAX) PACKET    Take 17 g by mouth daily.   ROSUVASTATIN (CRESTOR) 40 MG TABLET    Take 1 tablet (40 mg total) by mouth daily.   SPIRONOLACTONE (ALDACTONE) 25 MG TABLET    Take 0.5 tablets (12.5 mg total) by mouth daily.  Modified Medications   No medications on file  Discontinued Medications   No medications on file    Subjective: [name] was in for his followup visit. He has diabetes and suffered an injury at work that lead to infection of his right fifth finger. He apparently developed an abscess and osteomyelitis and had to have amputation of the distal finger in November. Operative cultures grew Klebsiella. He has been taking levofloxacin and is now completed a little of her 7 weeks of therapy. He is feeling better has had no further drainage from his finger for the past month. He has been released by his surgeon to return to work.  Review of Systems: Pertinent items are noted in HPI.  Past Medical History  Diagnosis Date  . Uncontrolled diabetes mellitus with complications   . Hypertension   . Hyperlipemia   . GERD (gastroesophageal reflux disease)   .  Diabetic gastroparesis   . Polyneuropathy in diabetes(357.2)     History  Substance Use Topics  . Smoking status: Never Smoker   . Smokeless tobacco: Never Used  . Alcohol Use: 10.8 oz/week    18 Cans of beer per week    No family history on file.  No Known Allergies  Objective: Temp: 98.1 F (36.7 C) (01/08 1003) Temp src: Oral (01/08 1003) BP: 169/99 mmHg (01/08 1003) Pulse Rate: 79 (01/08 1003)  General: He is in no distress He have some dry skin and scabbed areas over the amputated tip of his right fifth finger but no drainage or acute inflammation  Lab Results Lab Results  Component Value Date   CRP <0.5  11/29/2013   Lab Results  Component Value Date   ESRSEDRATE 30* 11/29/2013     Assessment: I suspect that he does osteomyelitis and abscess have been cured.  Plan: 1. Discontinue levofloxacin and observe off of antibiotics 2. Repeat sedimentation rate and C-reactive protein 3. Followup in 3 weeks   Michel Bickers, MD [facility] [place] [phone] pager   [phone] cell 12/20/2013, 10:16 AM

## 2014-01-08 ENCOUNTER — Ambulatory Visit: Payer: Self-pay | Admitting: Internal Medicine

## 2014-01-10 ENCOUNTER — Ambulatory Visit: Payer: Self-pay | Admitting: *Deleted

## 2014-01-31 ENCOUNTER — Ambulatory Visit (INDEPENDENT_AMBULATORY_CARE_PROVIDER_SITE_OTHER): Payer: Worker's Compensation | Admitting: Internal Medicine

## 2014-01-31 ENCOUNTER — Encounter: Payer: Self-pay | Admitting: Internal Medicine

## 2014-01-31 VITALS — BP 191/100 | HR 79 | Temp 97.0°F | Wt 311.0 lb

## 2014-01-31 DIAGNOSIS — M869 Osteomyelitis, unspecified: Secondary | ICD-10-CM

## 2014-01-31 DIAGNOSIS — E1165 Type 2 diabetes mellitus with hyperglycemia: Secondary | ICD-10-CM

## 2014-01-31 DIAGNOSIS — IMO0002 Reserved for concepts with insufficient information to code with codable children: Secondary | ICD-10-CM

## 2014-01-31 DIAGNOSIS — E118 Type 2 diabetes mellitus with unspecified complications: Secondary | ICD-10-CM

## 2014-01-31 LAB — C-REACTIVE PROTEIN: CRP: <0.5 mg/dL (ref <0.60)

## 2014-01-31 LAB — SEDIMENTATION RATE: Sed Rate: 6 mm/hr (ref 0–16)

## 2014-01-31 NOTE — Progress Notes (Signed)
Subjective:    Patient ID: Joshua [name], male    DOB: [birth year], 58 y.o.   MRN: [id number]  HPI 58yo M with uncontrolled DM who sustained right 5th finger injury at work that subsequently had GNR infection and osteomyelitis. He underwent incision and drainage on 11/18 and was started on vancomycin. gram stain demonstrated gram negative rods, however, so he was transitioned to unasyn. OR cx grew Klebsiella pneumonia ( Amp R but otherwise pan sensitive) . He returned to the OR on 11/18 which demonstrated extension of infection into the dorsal extensor sheath s/p I x D. Due to increasing fever, underwent amputation at PIP level on 11/20. all Cultures grew Klebsiella.he was discharged on levofloxacin x 4 wks and following up today near completion of 4 wks. he still has residual ulcer, poorly healing than one would expect 4 wks out of surgery. Finished taking levofloxacin until the end of December.  Right 5th finger amputation healing well.  No side effects of diarrhea or fungal skin infection from antibiotics  He saw hand surgeon today and now will start physical therapy. BS in the 220s, has not been seen b his endocrinologist. He reports difficulty to get in.  Current Outpatient Prescriptions on File Prior to Visit  Medication Sig Dispense Refill  . allopurinol (ZYLOPRIM) 300 MG tablet Take 300 mg by mouth daily.       [name] amlodipine-olmesartan (AZOR) 10-20 MG per tablet Take 1 tablet by mouth daily. Azor 10/$RemoveBefore'40mg'ZVjUjDUEMChbg$  once a day  30 tablet  0  . clopidogrel (PLAVIX) 75 MG tablet Take 1 tablet by mouth daily.      . hydrochlorothiazide (HYDRODIURIL) 25 MG tablet Take 0.5 tablets by mouth daily.      . insulin NPH-regular (HUMULIN 70/30) (70-30) 100 UNIT/ML injection Inject 40 Units into the skin 2 (two) times daily.  10 mL  12  . metFORMIN (GLUCOPHAGE) 500 MG tablet Take 1 tablet (500 mg total) by mouth 2 (two) times daily with a meal.      . metoprolol succinate (TOPROL-XL) 100 MG 24 hr tablet Take 1  tablet (100 mg total) by mouth daily. Take with or immediately following a meal.  30 tablet  6  . nitroGLYCERIN (NITROSTAT) 0.4 MG SL tablet Place 1 tablet (0.4 mg total) under the tongue every 5 (five) minutes as needed for chest pain.  25 tablet  3  . oxyCODONE-acetaminophen (PERCOCET/ROXICET) 5-325 MG per tablet Take 1-2 tablets by mouth every 4 (four) hours as needed for moderate pain or severe pain.  45 tablet  0  . polyethylene glycol (MIRALAX / GLYCOLAX) packet Take 17 g by mouth daily.  100 each  0  . rosuvastatin (CRESTOR) 40 MG tablet Take 1 tablet (40 mg total) by mouth daily.      [name] spironolactone (ALDACTONE) 25 MG tablet Take 0.5 tablets (12.5 mg total) by mouth daily.  30 tablet  9  . esomeprazole (NEXIUM) 40 MG capsule Take 1 capsule (40 mg total) by mouth daily.  30 capsule  0   No current facility-administered medications on file prior to visit.       Review of Systems Review of Systems  Constitutional: Negative for fever, chills, diaphoresis, activity change, appetite change, fatigue and unexpected #30 weight change since being out of work.  HENT: Negative for congestion, sore throat, rhinorrhea, sneezing, trouble swallowing and sinus pressure.  Eyes: Negative for photophobia and visual disturbance.  Respiratory: Negative for cough, chest tightness, shortness of breath, wheezing and  stridor.  Cardiovascular: Negative for chest pain, palpitations and leg swelling.  Gastrointestinal: Negative for nausea, vomiting, abdominal pain, diarrhea, constipation, blood in stool, abdominal distention and anal bleeding.  Genitourinary: Negative for dysuria, hematuria, flank pain and difficulty urinating.  Musculoskeletal: Negative for myalgias, back pain, joint swelling, arthralgias and gait problem.  Skin: Negative for color change, pallor, rash and wound.  Neurological: occ numbness to tip of 5th digit of right hand Hematological: Negative for adenopathy. Does not bruise/bleed easily.   Psychiatric/Behavioral: Negative for behavioral problems, confusion, sleep disturbance, dysphoric mood, decreased concentration and agitation.       Objective:   Physical Exam BP 191/100  Pulse 79  Temp(Src) 97 F (36.1 C) (Oral)  Wt 311 lb (141.069 kg) Physical Exam  Constitutional:  oriented to person, place, and time. appears well-developed and well-nourished. No distress.  HENT:  Mouth/Throat: Oropharynx is clear and moist. No oropharyngeal exudate.  Cardiovascular: Normal rate, regular rhythm and normal heart sounds. Exam reveals no gallop and no friction rub.  No murmur heard.  Pulmonary/Chest: Effort normal and breath sounds normal. No respiratory distress.  has no wheezes.  Abdominal: Soft. Bowel sounds are normal.  exhibits no distension. There is no tenderness.  Lymphadenopathy: no cervical adenopathy.  Neurological: alert and oriented to person, place, and time.  Skin: 5th finger amputated. Well healed but still has indent from surgical incision Psychiatric: a normal mood and affect. His behavior is normal.        Assessment & Plan:  5th finger osteo = will check sed rate and crp to ensure he has returned to baseline. Finished course of therapy at end of december.  Neuropathy = started on neurontin and meloxicam for pain management by hand surgeon  Diabetes mellitus = improved but not at goal. diffficult to see his old endocrine. Will refer to christina gherghe.  prn

## 2014-04-08 ENCOUNTER — Other Ambulatory Visit: Payer: Self-pay | Admitting: *Deleted

## 2014-04-08 MED ORDER — CLOPIDOGREL BISULFATE 75 MG PO TABS: 75.0000 mg | ORAL_TABLET | Freq: Every day | ORAL | Status: DC

## 2014-04-08 NOTE — Telephone Encounter (Signed)
Rx refill sent to patient pharmacy   

## 2014-06-05 ENCOUNTER — Telehealth: Payer: Self-pay | Admitting: *Deleted

## 2014-06-05 NOTE — Telephone Encounter (Signed)
Dentist is Lovena Neighbours, DDS.

## 2014-06-05 NOTE — Telephone Encounter (Signed)
Patient walked in. He needs tooth pulled ASAP but is on plavix. His dentist needs information regarding if he can hold this medication and for how long. The dentist name was not provided on initial walk-in sheet, but fax number is [phone]. RN called patient to find out who dentist is. Will defer this message to cardiologist.

## 2014-06-06 NOTE — Telephone Encounter (Signed)
Reviewed with Dr. Claiborne Billings.  Ok to hold plavix x 5 days for dental extraction.  Dr. Carmelia Roller fax [phone]

## 2014-06-10 ENCOUNTER — Encounter: Payer: Self-pay | Admitting: *Deleted

## 2014-06-11 ENCOUNTER — Telehealth: Payer: Self-pay | Admitting: *Deleted

## 2014-06-11 NOTE — Telephone Encounter (Signed)
Faxed clearance to have colonoscopy scheduled to be done by Dr. Michail Sermon  On 08/05/14

## 2014-06-18 ENCOUNTER — Encounter: Payer: Self-pay | Admitting: *Deleted

## 2014-06-24 ENCOUNTER — Ambulatory Visit (INDEPENDENT_AMBULATORY_CARE_PROVIDER_SITE_OTHER): Payer: BC Managed Care – PPO | Admitting: Cardiology

## 2014-06-24 ENCOUNTER — Encounter: Payer: Self-pay | Admitting: Cardiology

## 2014-06-24 VITALS — BP 167/84 | HR 74 | Ht 69.0 in | Wt 305.5 lb

## 2014-06-24 DIAGNOSIS — G4730 Sleep apnea, unspecified: Secondary | ICD-10-CM

## 2014-06-24 DIAGNOSIS — Z9861 Coronary angioplasty status: Secondary | ICD-10-CM

## 2014-06-24 DIAGNOSIS — R079 Chest pain, unspecified: Secondary | ICD-10-CM

## 2014-06-24 DIAGNOSIS — E785 Hyperlipidemia, unspecified: Secondary | ICD-10-CM

## 2014-06-24 DIAGNOSIS — E119 Type 2 diabetes mellitus without complications: Secondary | ICD-10-CM

## 2014-06-24 DIAGNOSIS — I2510 Atherosclerotic heart disease of native coronary artery without angina pectoris: Secondary | ICD-10-CM | POA: Insufficient documentation

## 2014-06-24 DIAGNOSIS — I10 Essential (primary) hypertension: Secondary | ICD-10-CM

## 2014-06-24 MED ORDER — ISOSORBIDE MONONITRATE ER 30 MG PO TB24: 30.0000 mg | ORAL_TABLET | Freq: Every day | ORAL | Status: DC

## 2014-06-24 NOTE — Assessment & Plan Note (Signed)
Elevated B/P in the office today

## 2014-06-24 NOTE — Assessment & Plan Note (Signed)
BMI 45 

## 2014-06-24 NOTE — Assessment & Plan Note (Signed)
Compliant with C-pap

## 2014-06-24 NOTE — Assessment & Plan Note (Signed)
On high dose statin Rx 

## 2014-06-24 NOTE — Assessment & Plan Note (Signed)
He has noticed some exertional discomfort recently suspicious for angina.

## 2014-06-24 NOTE — Progress Notes (Signed)
06/24/2014 Joshua [name]   Aug 14, 1956  [id number]  Primary Physicia Joshua Austria, MD Primary Cardiologist: Dr Joshua [name]  HPI:  58 y/o obese, AA male, work at [employer] and [employer], who had STEMI on 10/31/2011 with ECG findings of anterolateral ST segment elevation. Catheterization revealed 99% mid LAD stenosis proximal to this there was a 60-70% stenosis after the first diagonal vessel. He also had 95% mid RCA stenosis and 30% circumflex stenosis. He underwent initial acute intervention to his LAD and a 3.0x32 mm Promus DES stent. 3 days later he underwent successful staged PCI to his 95% RCA stenosis and a 3.0x18 mm Resolute DES stent.           Additional problems include at least a 30 year history of hypertension, a 25 year history of diabetes, obesity, sleep apnea on C-pap, and hyperlipidemia. In Dec 2014 he suffered a minor injury at work but this apparently developed into osteomyelitis and he ended up having his Rt 5th finger amputated.              He is in the office today for a 6 month check up. He tells me he has noticed some exertional chest discomfort. Its hard for him to describe. He noticed it mowing the lawn yesterday, it did not make him stop and he did not take NTG.  .     Current Outpatient Prescriptions  Medication Sig Dispense Refill  . allopurinol (ZYLOPRIM) 300 MG tablet Take 300 mg by mouth daily.       [name] amlodipine-olmesartan (AZOR) 10-20 MG per tablet Take 1 tablet by mouth daily. Azor 10/$RemoveBefore'40mg'rmuCCmDlwrCxV$  once a day  30 tablet  0  . amoxicillin (AMOXIL) 500 MG capsule Take 1 capsule by mouth 4 (four) times daily as needed.      . clopidogrel (PLAVIX) 75 MG tablet Take 1 tablet (75 mg total) by mouth daily.  30 tablet  1  . HYDROcodone-acetaminophen (NORCO/VICODIN) 5-325 MG per tablet Take 1 tablet by mouth daily as needed.      . insulin NPH-regular (HUMULIN 70/30) (70-30) 100 UNIT/ML injection Inject 40 Units into the skin 2 (two) times daily.  10 mL  12  . metFORMIN  (GLUCOPHAGE) 500 MG tablet Take 1 tablet (500 mg total) by mouth 2 (two) times daily with a meal.      . metoprolol succinate (TOPROL-XL) 100 MG 24 hr tablet Take 1 tablet (100 mg total) by mouth daily. Take with or immediately following a meal.  30 tablet  6  . nitroGLYCERIN (NITROSTAT) 0.4 MG SL tablet Place 1 tablet (0.4 mg total) under the tongue every 5 (five) minutes as needed for chest pain.  25 tablet  3  . oxyCODONE-acetaminophen (PERCOCET/ROXICET) 5-325 MG per tablet Take 1-2 tablets by mouth every 4 (four) hours as needed for moderate pain or severe pain.  45 tablet  0  . rosuvastatin (CRESTOR) 40 MG tablet Take 1 tablet (40 mg total) by mouth daily.      [name] spironolactone (ALDACTONE) 25 MG tablet Take 0.5 tablets (12.5 mg total) by mouth daily.  30 tablet  9  . isosorbide mononitrate (IMDUR) 30 MG 24 hr tablet Take 1 tablet (30 mg total) by mouth daily.  30 tablet  5   No current facility-administered medications for this visit.    No Known Allergies  History   Social History  . Marital Status: Married    Spouse Name: N/A    Number of Children: N/A  .  Years of Education: N/A   Occupational History  . Not on file.   Social History Main Topics  . Smoking status: Never Smoker   . Smokeless tobacco: Never Used  . Alcohol Use: 10.8 oz/week    18 Cans of beer per week  . Drug Use: No  . Sexual Activity: Yes   Other Topics Concern  . Not on file   Social History Narrative  . No narrative on file     Review of Systems: General: negative for chills, fever, night sweats or weight changes.  Cardiovascular: negative for chest pain, dyspnea on exertion, edema, orthopnea, palpitations, paroxysmal nocturnal dyspnea or shortness of breath Dermatological: negative for rash Respiratory: negative for cough or wheezing Urologic: negative for hematuria Abdominal: negative for nausea, vomiting, diarrhea, bright red blood per rectum, melena, or hematemesis Neurologic: negative for  visual changes, syncope, or dizziness All other systems reviewed and are otherwise negative except as noted above.    Blood pressure 167/84, pulse 74, height $RemoveBe'5\' 9"'TayAQKBSJ$  (1.753 m), weight 305 lb 8 oz (138.574 kg).  General appearance: alert, cooperative, no distress and morbidly obese Neck: no carotid bruit and no JVD Lungs: clear to auscultation bilaterally Heart: regular rate and rhythm  EKG NSR  ASSESSMENT AND PLAN:   Chest pain with moderate risk of acute coronary syndrome He has noticed some exertional discomfort recently suspicious for angina.  CAD in native artery LAD DES followed 3 days later by staged RCA DES Nov 2012  DIABETES MELLITUS, TYPE II Recent amputation Rt 5th finger after an infection.  Morbid obesity BMI 45  HYPERTENSION Elevated B/P in the office today  HYPERLIPIDEMIA On high dose statin Rx  Sleep apnea- on C-pap Compliant with C-pap   PLAN  I added Imdur to Mr Joshua [name]'s medications. I scheduled an exercise Myoview to be done, he had one in April 2013 that was low risk. I'll see him in follow up after this.   Joshua [name] KPA-C 06/24/2014 10:55 AM

## 2014-06-24 NOTE — Patient Instructions (Signed)
Your physician has requested that you have treadmill stress myoview. For further information please visit [email or web address]. Please follow instruction sheet, as given. HOLD METOPROLOL SUCC on the day of your study.  Kerin Ransom, PA-C has recommended making the following medication changes:  START Isosorbide Mononitrate 30 mg - take 1 tablet daily  Your physician recommends that you schedule a follow-up appointment in 2 weeks with Lurena Joiner.  [name]

## 2014-06-24 NOTE — Assessment & Plan Note (Addendum)
Recent amputation Rt 5th finger after an infection.

## 2014-06-24 NOTE — Assessment & Plan Note (Signed)
LAD DES followed 3 days later by staged RCA DES Nov 2012

## 2014-06-28 ENCOUNTER — Telehealth (HOSPITAL_COMMUNITY): Payer: Self-pay

## 2014-06-28 NOTE — Telephone Encounter (Signed)
Encounter complete. 

## 2014-07-03 ENCOUNTER — Ambulatory Visit (HOSPITAL_BASED_OUTPATIENT_CLINIC_OR_DEPARTMENT_OTHER)
Admission: RE | Admit: 2014-07-03 | Discharge: 2014-07-03 | Disposition: A | Discharge status: Home / Self Care | Payer: BC Managed Care – PPO | Source: Ambulatory Visit | Attending: Internal Medicine | Admitting: Internal Medicine

## 2014-07-03 ENCOUNTER — Encounter (HOSPITAL_COMMUNITY): Payer: Self-pay | Admitting: General Practice

## 2014-07-03 ENCOUNTER — Encounter (HOSPITAL_COMMUNITY)
Admission: AD | Disposition: A | Discharge status: Home / Self Care | Payer: BC Managed Care – PPO | Source: Ambulatory Visit | Attending: Interventional Cardiology

## 2014-07-03 ENCOUNTER — Inpatient Hospital Stay (HOSPITAL_COMMUNITY)
Admission: AD | Admit: 2014-07-03 | Discharge: 2014-07-04 | DRG: 247 | Disposition: A | Discharge status: Home / Self Care | Payer: BC Managed Care – PPO | Source: Ambulatory Visit | Attending: Interventional Cardiology | Admitting: Interventional Cardiology

## 2014-07-03 DIAGNOSIS — I10 Essential (primary) hypertension: Secondary | ICD-10-CM | POA: Diagnosis present

## 2014-07-03 DIAGNOSIS — Z6841 Body Mass Index (BMI) 40.0 and over, adult: Secondary | ICD-10-CM

## 2014-07-03 DIAGNOSIS — R079 Chest pain, unspecified: Secondary | ICD-10-CM

## 2014-07-03 DIAGNOSIS — R9439 Abnormal result of other cardiovascular function study: Secondary | ICD-10-CM | POA: Diagnosis present

## 2014-07-03 DIAGNOSIS — E785 Hyperlipidemia, unspecified: Secondary | ICD-10-CM | POA: Diagnosis present

## 2014-07-03 DIAGNOSIS — Z9861 Coronary angioplasty status: Secondary | ICD-10-CM

## 2014-07-03 DIAGNOSIS — E119 Type 2 diabetes mellitus without complications: Secondary | ICD-10-CM | POA: Diagnosis present

## 2014-07-03 DIAGNOSIS — G4730 Sleep apnea, unspecified: Secondary | ICD-10-CM | POA: Diagnosis present

## 2014-07-03 DIAGNOSIS — I2119 ST elevation (STEMI) myocardial infarction involving other coronary artery of inferior wall: Secondary | ICD-10-CM

## 2014-07-03 DIAGNOSIS — G909 Disorder of the autonomic nervous system, unspecified: Secondary | ICD-10-CM | POA: Diagnosis present

## 2014-07-03 DIAGNOSIS — Z955 Presence of coronary angioplasty implant and graft: Secondary | ICD-10-CM

## 2014-07-03 DIAGNOSIS — K219 Gastro-esophageal reflux disease without esophagitis: Secondary | ICD-10-CM | POA: Diagnosis present

## 2014-07-03 DIAGNOSIS — I200 Unstable angina: Secondary | ICD-10-CM | POA: Diagnosis present

## 2014-07-03 DIAGNOSIS — Z7901 Long term (current) use of anticoagulants: Secondary | ICD-10-CM

## 2014-07-03 DIAGNOSIS — E1149 Type 2 diabetes mellitus with other diabetic neurological complication: Secondary | ICD-10-CM | POA: Diagnosis present

## 2014-07-03 DIAGNOSIS — Z794 Long term (current) use of insulin: Secondary | ICD-10-CM

## 2014-07-03 DIAGNOSIS — I2510 Atherosclerotic heart disease of native coronary artery without angina pectoris: Principal | ICD-10-CM | POA: Diagnosis present

## 2014-07-03 DIAGNOSIS — K3184 Gastroparesis: Secondary | ICD-10-CM | POA: Diagnosis present

## 2014-07-03 DIAGNOSIS — E66813 Obesity, class 3: Secondary | ICD-10-CM | POA: Diagnosis present

## 2014-07-03 DIAGNOSIS — I252 Old myocardial infarction: Secondary | ICD-10-CM

## 2014-07-03 HISTORY — DX: Acute myocardial infarction, unspecified: I21.9

## 2014-07-03 HISTORY — DX: Gout, unspecified: M10.9

## 2014-07-03 HISTORY — DX: Cardiac murmur, unspecified: R01.1

## 2014-07-03 HISTORY — PX: CORONARY ANGIOPLASTY WITH STENT PLACEMENT: SHX49

## 2014-07-03 HISTORY — PX: LEFT HEART CATHETERIZATION WITH CORONARY ANGIOGRAM: SHX5451

## 2014-07-03 HISTORY — DX: Sarcoidosis of lung: D86.0

## 2014-07-03 HISTORY — DX: Heart failure, unspecified: I50.9

## 2014-07-03 HISTORY — DX: Obstructive sleep apnea (adult) (pediatric): G47.33

## 2014-07-03 HISTORY — DX: Obstructive sleep apnea (adult) (pediatric): Z99.89

## 2014-07-03 LAB — PROTIME-INR
INR: 0.93 (ref 0.00–1.49)
Prothrombin Time: 12.5 seconds (ref 11.6–15.2)

## 2014-07-03 LAB — POCT I-STAT, CHEM 8
BUN: 11 mg/dL (ref 6–23)
Calcium, Ion: 1.11 mmol/L — ABNORMAL LOW (ref 1.12–1.23)
Chloride: 117 mEq/L — ABNORMAL HIGH (ref 96–112)
Creatinine, Ser: 1.1 mg/dL (ref 0.50–1.35)
Glucose, Bld: 260 mg/dL — ABNORMAL HIGH (ref 70–99)
HCT: 40 % (ref 39.0–52.0)
Hemoglobin: 13.6 g/dL (ref 13.0–17.0)
Potassium: 3.7 mEq/L (ref 3.7–5.3)
Sodium: 134 mEq/L — ABNORMAL LOW (ref 137–147)
TCO2: 25 mmol/L (ref 0–100)

## 2014-07-03 LAB — COMPREHENSIVE METABOLIC PANEL
ALT: 21 U/L (ref 0–53)
AST: 23 U/L (ref 0–37)
Albumin: 2.8 g/dL — ABNORMAL LOW (ref 3.5–5.2)
Alkaline Phosphatase: 120 U/L — ABNORMAL HIGH (ref 39–117)
Anion gap: 13 (ref 5–15)
BUN: 13 mg/dL (ref 6–23)
CO2: 23 mEq/L (ref 19–32)
Calcium: 8.5 mg/dL (ref 8.4–10.5)
Chloride: 103 mEq/L (ref 96–112)
Creatinine, Ser: 1.02 mg/dL (ref 0.50–1.35)
GFR calc Af Amer: >90 mL/min (ref >90)
GFR calc non Af Amer: 81 mL/min — ABNORMAL LOW (ref >90)
Glucose, Bld: 251 mg/dL — ABNORMAL HIGH (ref 70–99)
Potassium: 4 mEq/L (ref 3.7–5.3)
Sodium: 139 mEq/L (ref 137–147)
Total Bilirubin: <0.2 mg/dL — ABNORMAL LOW (ref 0.3–1.2)
Total Protein: 6.6 g/dL (ref 6.0–8.3)

## 2014-07-03 LAB — CBC
HCT: 45.5 % (ref 39.0–52.0)
Hemoglobin: 15.1 g/dL (ref 13.0–17.0)
MCH: 29 pg (ref 26.0–34.0)
MCHC: 33.2 g/dL (ref 30.0–36.0)
MCV: 87.3 fL (ref 78.0–100.0)
Platelets: 194 10*3/uL (ref 150–400)
RBC: 5.21 MIL/uL (ref 4.22–5.81)
RDW: 14.4 % (ref 11.5–15.5)
WBC: 6.7 10*3/uL (ref 4.0–10.5)

## 2014-07-03 LAB — HEMOGLOBIN A1C
Hgb A1c MFr Bld: 10 % — ABNORMAL HIGH (ref <5.7)
Mean Plasma Glucose: 240 mg/dL — ABNORMAL HIGH (ref <117)

## 2014-07-03 LAB — GLUCOSE, CAPILLARY
Glucose-Capillary: 329 mg/dL — ABNORMAL HIGH (ref 70–99)
Glucose-Capillary: 108 mg/dL — ABNORMAL HIGH (ref 70–99)
Glucose-Capillary: 158 mg/dL — ABNORMAL HIGH (ref 70–99)

## 2014-07-03 LAB — POCT ACTIVATED CLOTTING TIME: Activated Clotting Time: 625 seconds

## 2014-07-03 LAB — LIPID PANEL
Cholesterol: 215 mg/dL — ABNORMAL HIGH (ref 0–200)
HDL: 90 mg/dL (ref >39)
LDL Cholesterol: 100 mg/dL — ABNORMAL HIGH (ref 0–99)
Total CHOL/HDL Ratio: 2.4 RATIO
Triglycerides: 127 mg/dL (ref <150)
VLDL: 25 mg/dL (ref 0–40)

## 2014-07-03 LAB — CK TOTAL AND CKMB (NOT AT ARMC)
CK, MB: 8.5 ng/mL (ref 0.3–4.0)
Relative Index: 1.7 (ref 0.0–2.5)
Total CK: 492 U/L — ABNORMAL HIGH (ref 7–232)

## 2014-07-03 LAB — TROPONIN I: Troponin I: <0.3 ng/mL (ref <0.30)

## 2014-07-03 LAB — APTT: aPTT: 32 seconds (ref 24–37)

## 2014-07-03 SURGERY — LEFT HEART CATHETERIZATION WITH CORONARY ANGIOGRAM
Anesthesia: LOCAL

## 2014-07-03 MED ORDER — BIVALIRUDIN 250 MG IV SOLR
INTRAVENOUS | Status: AC
  Filled 2014-07-03: qty 250

## 2014-07-03 MED ORDER — MIDAZOLAM HCL 2 MG/2ML IJ SOLN
INTRAMUSCULAR | Status: AC
  Filled 2014-07-03: qty 2

## 2014-07-03 MED ORDER — HEPARIN SODIUM (PORCINE) 1000 UNIT/ML IJ SOLN
INTRAMUSCULAR | Status: AC
  Filled 2014-07-03: qty 1

## 2014-07-03 MED ORDER — LIDOCAINE HCL (PF) 1 % IJ SOLN
INTRAMUSCULAR | Status: AC
  Filled 2014-07-03: qty 30

## 2014-07-03 MED ORDER — ATORVASTATIN CALCIUM 80 MG PO TABS
80.0000 mg | ORAL_TABLET | Freq: Every day | ORAL | Status: DC
  Administered 2014-07-03: 18:00:00 80 mg via ORAL
  Filled 2014-07-03 (×2): qty 1

## 2014-07-03 MED ORDER — TICAGRELOR 90 MG PO TABS
90.0000 mg | ORAL_TABLET | Freq: Two times a day (BID) | ORAL | Status: DC
  Administered 2014-07-03 – 2014-07-04 (×2): 90 mg via ORAL
  Filled 2014-07-03 (×3): qty 1

## 2014-07-03 MED ORDER — TICAGRELOR 90 MG PO TABS
ORAL_TABLET | ORAL | Status: AC
  Filled 2014-07-03: qty 2

## 2014-07-03 MED ORDER — INSULIN ASPART 100 UNIT/ML ~~LOC~~ SOLN: 0.0000 [IU] | Freq: Three times a day (TID) | SUBCUTANEOUS | Status: DC

## 2014-07-03 MED ORDER — ONDANSETRON HCL 4 MG/2ML IJ SOLN
4.0000 mg | Freq: Four times a day (QID) | INTRAMUSCULAR | Status: DC | PRN
Start: 2014-07-03 — End: 2014-07-04

## 2014-07-03 MED ORDER — INSULIN ASPART 100 UNIT/ML ~~LOC~~ SOLN
0.0000 [IU] | Freq: Three times a day (TID) | SUBCUTANEOUS | Status: DC
  Administered 2014-07-04: 3 [IU] via SUBCUTANEOUS

## 2014-07-03 MED ORDER — FENTANYL CITRATE 0.05 MG/ML IJ SOLN
INTRAMUSCULAR | Status: AC
  Filled 2014-07-03: qty 2

## 2014-07-03 MED ORDER — NITROGLYCERIN 1 MG/10 ML FOR IR/CATH LAB
INTRA_ARTERIAL | Status: AC
  Filled 2014-07-03: qty 10

## 2014-07-03 MED ORDER — METOPROLOL SUCCINATE ER 100 MG PO TB24
100.0000 mg | ORAL_TABLET | Freq: Every day | ORAL | Status: DC
  Administered 2014-07-03: 100 mg via ORAL
  Filled 2014-07-03 (×2): qty 1

## 2014-07-03 MED ORDER — ACETAMINOPHEN 325 MG PO TABS
650.0000 mg | ORAL_TABLET | ORAL | Status: DC | PRN
  Administered 2014-07-03: 650 mg via ORAL
  Filled 2014-07-03: qty 2

## 2014-07-03 MED ORDER — INSULIN ASPART 100 UNIT/ML ~~LOC~~ SOLN: 0.0000 [IU] | Freq: Every day | SUBCUTANEOUS | Status: DC

## 2014-07-03 MED ORDER — TECHNETIUM TC 99M SESTAMIBI GENERIC - CARDIOLITE
10.0000 | Freq: Once | INTRAVENOUS | Status: AC | PRN
  Administered 2014-07-03: 10 via INTRAVENOUS

## 2014-07-03 MED ORDER — HYDROCODONE-ACETAMINOPHEN 5-325 MG PO TABS: 1.0000 | ORAL_TABLET | Freq: Every day | ORAL | Status: DC | PRN

## 2014-07-03 MED ORDER — NITROGLYCERIN IN D5W 200-5 MCG/ML-% IV SOLN
30.0000 ug/min | INTRAVENOUS | Status: AC
  Administered 2014-07-03: 30 ug/min via INTRAVENOUS

## 2014-07-03 MED ORDER — NITROGLYCERIN 0.4 MG SL SUBL: 0.4000 mg | SUBLINGUAL_TABLET | SUBLINGUAL | Status: DC | PRN

## 2014-07-03 MED ORDER — IRBESARTAN 150 MG PO TABS
150.0000 mg | ORAL_TABLET | Freq: Every day | ORAL | Status: DC
  Administered 2014-07-03: 150 mg via ORAL
  Filled 2014-07-03 (×2): qty 1

## 2014-07-03 MED ORDER — SPIRONOLACTONE 12.5 MG HALF TABLET
12.5000 mg | ORAL_TABLET | Freq: Every day | ORAL | Status: DC
  Administered 2014-07-03: 12.5 mg via ORAL
  Filled 2014-07-03 (×2): qty 1

## 2014-07-03 MED ORDER — SODIUM CHLORIDE 0.9 % IV SOLN: INTRAVENOUS | Status: AC

## 2014-07-03 MED ORDER — HEPARIN (PORCINE) IN NACL 2-0.9 UNIT/ML-% IJ SOLN
INTRAMUSCULAR | Status: AC
  Filled 2014-07-03: qty 1000

## 2014-07-03 MED ORDER — ASPIRIN 81 MG PO CHEW
81.0000 mg | CHEWABLE_TABLET | Freq: Every day | ORAL | Status: DC
Start: 2014-07-03 — End: 2014-07-04
  Filled 2014-07-03: qty 1

## 2014-07-03 MED ORDER — AMLODIPINE-OLMESARTAN 10-20 MG PO TABS: 1.0000 | ORAL_TABLET | Freq: Every day | ORAL | Status: DC

## 2014-07-03 MED ORDER — ALLOPURINOL 300 MG PO TABS
300.0000 mg | ORAL_TABLET | Freq: Every day | ORAL | Status: DC
  Administered 2014-07-03: 300 mg via ORAL
  Filled 2014-07-03 (×2): qty 1

## 2014-07-03 MED ORDER — AMLODIPINE BESYLATE 10 MG PO TABS
10.0000 mg | ORAL_TABLET | Freq: Every day | ORAL | Status: DC
  Administered 2014-07-03: 18:00:00 10 mg via ORAL
  Filled 2014-07-03 (×2): qty 1

## 2014-07-03 NOTE — H&P (View-Only) (Signed)
I was asked as the DOD to evaluate Joshua [name] who presented for his nuclear stress test today. He does have a history of ST elevation MI in 2012 with inferior ST elevations which were convex and about 2 mm. At that time he denied any chest pain and was only mildly nauseated. He was surprised that he was sent for cardiac catheterization urgently. Recently he's been having more chest pain with exertion especially when mowing his lawn and doing certain activities. He says the symptoms feel very similar to symptoms he had prior to his first heart attack although they were very minimal. He is a long-standing diabetic over 25 years. He was seen in the office one week ago and his EKG showed only mild abnormalities. Today is her to be hypertensive with blood pressure 185/115. His EKG shows 3 mm of concave ST elevation inferiorly in 2,3 and aVF which is new. This was identified after his rest injections and pictures for his nuclear stress test. I briefly reviewed his images and there are abnormalities at rest. He is not describing significant chest pain this time however I'm concerned that he had no significant symptoms during his prior episode. He reports she's been off of aspirin and Plavix for 2 weeks due to recent dental extraction. I do not feel comfortable with him proceeding with the stress portion of his nuclear stress test. I reviewed the EKG with Dr. Gwenlyn Found and called and discussed the case with Dr. Pernell Dupre is the interventional list on call. The patient will be sent urgently to [facility] for cardiac catheterization.  Pixie Casino, MD, [facility] Attending Cardiologist [place]

## 2014-07-03 NOTE — Procedures (Addendum)
[place] [address] [place] [address] [phone]  Cardiology Nuclear Med Study  Joshua [name] is a 58 y.o. male     MRN : [id number]     DOB: 06/30/1956  Procedure Date: 07/03/2014  Nuclear Med Background Indication for Stress Test:  Evaluation for Ischemia and Stent Patency History:  CAD;MI-10/31/2011;STENT/PTCA;Last NUC MPI on 12/29/2011-nonischemic;EF=41% Cardiac Risk Factors: Hypertension, Lipids, NIDDM, Obesity and polyneuropathyindiabetes;diabetic gastroperesis.  Symptoms:  Chest Pain, DOE and Fatigue   Nuclear Pre-Procedure Caffeine/Decaff Intake:  7:00pm NPO After: 5:00am   IV Site: R Forearm  IV 0.9% NS with Angio Cath:  22g  Chest Size (in):  54"  IV Started by: Rolene Course, RN  Height: $Remove'5\' 9"'dptTFMh$  (1.753 m)  Cup Size: n/a  BMI:  Body mass index is 45.02 kg/(m^2). Weight:  305 lb (138.347 kg)   Tech Comments:  n/a    Nuclear Med Study 1 or 2 day study: 1 day  Stress Test Type:  Stress  Order Authorizing Provider:  Lavonda Jumbo, MD   Resting Radionuclide: Technetium 41m Sestamibi  Resting Radionuclide Dose: 10.2 mCi   Stress Radionuclide:  Technetium 4m Sestamibi  Stress Radionuclide Dose:n/a mCi           Stress Protocol Rest HR:76 Stress HR: n/a  Rest BP: 189/115 Stress BP: n/a  Exercise Time (min): n/a METS:          Dose of Adenosine (mg):  n/a Dose of Lexiscan: n/a mg  Dose of Atropine (mg): n/a Dose of Dobutamine: n/a mcg/kg/min (at max HR)  Stress Test Technologist: Darlina Sicilian, CCT Nuclear Technologist: Otho Perl, CNMT   Rest Procedure:  Myocardial perfusion imaging was performed at rest 45 minutes following the intravenous administration of Technetium 71m Sestamibi. Stress Procedure:  n/a  Transient Ischemic Dilatation (Normal <1.22):  n/a Lung/Heart Ratio (Normal <0.45):  n/a QGS EDV:  n/a ml QGS ESV:  n/a ml LV Ejection Fraction: n/a  Rest ECG: Normal sinus rhythm, new  3 mm concave ST elevation in II, II, AVF  Stress ECG: Stress was not performed due to ST elevation  QPS Raw Data Images:  Normal; no motion artifact; normal heart/lung ratio. Stress Images:  (Stress images not performed) Rest Images:  Decreased tracer uptake in the anteroseptal and inferoapical walls Subtraction (SDS):  N/A  Impression Exercise Capacity:  No stress performed. BP Response:  Markedly hypertensive at rest Clinical Symptoms:  Headache, no significant chest pain ECG Impression:  Marked 3-4 mm concave inferior ST elevation at rest Comparison with Prior Nuclear Study: Prior nuclear stress test in 2013 was non-ischemic   Overall Impression:  Indeterminate risk study. Rest images only were obtained. There are anterior and inferoapical rest defects. There appears to be buldging of the inferoapex, possibly an inferoapical aneurysm.  LV Wall Motion:  Non-gated.  Based on rest defects, recent chest pain and new inferior ST elevation in the territory with prior stents, I recommended to forgo the stress imaging and referred him to the hospital for direct cardiac catheterization.  Pixie Casino, MD, [facility] Board Certified in Nuclear Cardiology Attending Cardiologist [place], MD  07/03/2014 11:26 AM

## 2014-07-03 NOTE — Progress Notes (Signed)
Pt will place on CPAP mask when ready. Pt wears regularly at home. Pt encouraged to call RT if needing any assistance.

## 2014-07-03 NOTE — CV Procedure (Signed)
Left Heart Catheterization with Coronary Angiography and PCI Report  Joshua [name]  58 y.o.  male Jul 20, 1956  Procedure Date: 07/03/2014 Referring Physician: Mali Hilty, M.D. Primary Cardiologist: [name], M.D.  INDICATIONS: Unstable angina pectoris with intermittent inferior lead ST elevation  PROCEDURE: 1. Left heart catheterization; 2. Coronary angiography; 3. Left ventriculography; 4. DES distal RCA and proximal LAD  CONSENT:  The risks, benefits, and details of the procedure were explained in detail to the patient. Risks including death, stroke, heart attack, kidney injury, allergy, limb ischemia, bleeding and radiation injury were discussed.  The patient verbalized understanding and wanted to proceed.  Informed written consent was obtained.  PROCEDURE TECHNIQUE:  After Xylocaine anesthesia a 5 French Slender sheath was placed in the right radial artery with an angiocath and the modified Seldinger technique.  Coronary angiography was done using a 5 F JR 4 and JL 3.5 cm catheter.  Left ventriculography was done using the JR 4 catheter and hand injection.   Digital images demonstrated high-grade distal RCA stenosis beyond the previously placed stent. Smooth 30-40% diffuse ISR in the mid RCA stent. The LAD stent was widely patent but proximal to the stent margin was in 85-95% stenosis likely representing ISR due to  Balloon margin trauma.  I have not discussion with this diabetic patient. The previously placed stents have not failed over 3 years. These other notable lesions. His treatment option could include coronary bypass grafting for stenting. After discussing with the patient we decided to go with percutaneous coronary intervention as he was opposed to consideration of bypass surgery at his young age. We discussed a period of time for him to contemplate the appropriate treatment option. He wanted to proceed. I concurred with this decision had PCI as it seemed the most  appropriate management strategy in this obese diabetic who is only 58 years of age. Each lesion was a type A lesion with expected excellent angiographic outcome after stenting.  We upgraded to a JR 4 guide catheter. A bolus and infusion a bivalirudin was given.Brilinta 180 mg loading dose was given. ACT was documented greater than 300 seconds. After guiding shots were obtained, we perform PCI of the distal right coronary using a 0.014 BMW coronary wire. Predilatation was performed with a 2.5 x 12 mm long Euphora. After predilatation a 16 x 2.75 mm Promus Premier was positioned and deployed. Postdilatation was performed with a 3.25 x 12 mm long [place] Euphora. A very nice angiographic result was obtained.  We then turned our attention to the LAD. A 6 French XB LAD guide catheter was used to obtain guiding shots. The same BMW wire was used to cross the stenosis. The same predilatation balloon was used to prepare the culprit site in the LAD. We then positioned and deployed a 16 x 3.5 mm Promus Premier and post dilated with a 3.75 x 12 mm  [place] to 14 atmospheres x2 inflations. We did stent across a diagonal branch. No evidence of slow flow was noted in the diagonal. A nice angiographic result was obtained.  The radial sheath was removed and hemostasis achieved with a wrist band.  Full-strength biva lirudin infusion will be continued until the current IV bag is depleted.   CONTRAST:  Total of 260 cc.  COMPLICATIONS:  None   HEMODYNAMICS:  Aortic pressure 158/90 mmHg; LV pressure 160/15 mmHg; LVEDP 23 mmHg  ANGIOGRAPHIC DATA:   The left main coronary artery is widely patent.  The left anterior descending  artery is  patent stent in the midsegment. Proximal to the stent there is 95% stenosis at the proximal stent margin.  The left circumflex artery is widely patent.. The mid vessel after the first obtuse marginal contains 50-70% stenosis before the large second and third obtuse marginal branches.  The  right coronary artery is widely patent, particularly the mid RCA stent. Beyond the stent in the distal segment there is an eccentric 90% stenosis. The RCA beyond this is diffusely diseased but without significant obstruction. It ends on a large PDA.   PCI RESULTS: The distal RCA stenosis was reduced from 90% to 0% after PTCA and stenting using DES and post dilated to 3.25 mm in diameter.  The proximal LAD lesion was reduced from 95% to 0% after PTCA and stenting with DES and post dilated to 3.75 mm in diameter  LEFT VENTRICULOGRAM:  Left ventricular angiogram was done in the 30 RAO projection and revealed inferior hypokinesis with an EF of 50-55%.   IMPRESSIONS:  1. Unstable angina pectoris with transient inferior ST elevation. 2. Successful drug-eluting stent implantation in the distal right coronary with reduction and 90% stenosis to 0% with TIMI grade 3 flow 3. Successful drug-eluting stent implantation in the proximal LAD with reduction 95% stenosis to 0% with TIMI grade 3 flow 4. Moderate stenosis in the mid circumflex 5. Inferior hypokinesis with preserved systolic function   RECOMMENDATION:  Aggressive risk factor modification to include diabetes control, hypertension control, weight loss, and lipid management. Aspirin and Brilinta. Will need lifelong dual antiplatelet therapy. This which Plavix in 2-3 months. Probable discharge in a.m.

## 2014-07-03 NOTE — H&P (Signed)
Joshua [name] is an 58 y.o. male.   Chief Complaint:  Chest pain HPI:   The patient is a 58 year old obese African American male with history of uncontrolled diabetes, hypertension, hyperlipidemia, GERD, diabetic gastroparesis, polyneuropathy, coronary artery disease.  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  He had a STEMI on 10/31/2011 with ECG findings of anterolateral ST segment elevation. Catheterization revealed 99% mid LAD stenosis proximal to this there was a 60-70% stenosis after the first diagonal vessel. He also had 95% mid RCA stenosis and 30% circumflex stenosis. He underwent initial acute intervention to his LAD and a 3.0x32 mm Promus DES stent. 3 days later he underwent successful staged PCI to his 95% RCA stenosis and a 3.0x18 mm Resolute DES stent.   Patient reports that for about a month now he's noticed exertional dyspnea when he walks across the parking lot the stairs at work where before that he was not having any problems. He also noticed a funny feeling in his chest he would not quite described as pain or tightness but noticeable.  He also states this is similar to prior episodes at which time he had a T. elevation MI.  He also reports some nausea but no vomiting. He says some right upper quadrant pain but this occurred after he leaned over some rollers at work and may have strained a muscle.  This symptom has resolved for the most part.  He denies diaphoresis, orthopnea, PND, hematochezia, melena.  Patient was seen for nuclear stress test at the [facility] office and EKG changes consistent with ischemia. He was sent directly for cardiac catheterization prior to having stress images.  Past Medical History  Diagnosis Date  . Uncontrolled diabetes mellitus with complications   . Hypertension   . Hyperlipemia   . GERD (gastroesophageal reflux disease)   . Diabetic gastroparesis   . Polyneuropathy in diabetes(357.2)   . Coronary artery disease     Past  Surgical History  Procedure Laterality Date  . Breast lumpectomy    . Incision and drainage Right 10/28/2013    Procedure: INCISION AND DRAINAGE Right small finger;  Surgeon: [name], MD;  Location: [facility];  Service: Orthopedics;  Laterality: Right;  . I&d extremity Right 10/30/2013    Procedure: IRRIGATION AND DEBRIDEMENT RIGHT SMALL FINGER POSSIBLE SECONDARY WOUND CLOSURE;  Surgeon: [name], MD;  Location: [facility];  Service: Orthopedics;  Laterality: Right;  [name] is available after 4pm  . I&d extremity Right 11/01/2013    Procedure:  IRRIGATION AND DEBRIDEMENT RIGHT  PROXIMAL PHALANGEAL LEVEL AMPUTATION;  Surgeon: [name], MD;  Location: [facility];  Service: Orthopedics;  Laterality: Right;  . Doppler echocardiography N/A 11-23-2011    TECHNICALLY DIFFICULT. LV GROSSLY NORMAL IN SIZE. LV SYSTOLIC FUNCTION IS MILDLY REDUCED. EF=45-50%. DOPPLER FLOW PATTERN WAS NORMAL FOR AGE. MILD  . Nuclear stress test N/A 12-29-2011    MILD PERFUSION DEFECT IS SEEN IN THE APICAL SEPTAL AMD MID INFEROSEPTAL REGIONS. THIS IS CONSISTANT WITH INFARCT/SCAR. NO EVIDENCE OF INDUCIBLE ISCHEMIA. EF 41%. LV SYSTOLIC FUNCTION IS MODERATELY REDUCED.   . Cardiac catheterization  10/31/2011    30 % MID ATRIOVENTRICULAR GROOVE CX STENOSIS. 90 % MID RCA.PTA TO LAD/STENTING OF TANDEM 60-70%. LAD STENOSIS 99% REDUCED TO 0%.3.0 X 32MM PROMUS DES POSTDILATED TO 3.25MM.    No family history on file. Social History:  reports that he has never smoked. He has never used smokeless tobacco. He reports that he  drinks about 10.8 ounces of alcohol per week. He reports that he does not use illicit drugs.  Allergies: No Known Allergies  Medications Prior to Admission  Medication Sig Dispense Refill  . allopurinol (ZYLOPRIM) 300 MG tablet Take 300 mg by mouth daily.       [name] amlodipine-olmesartan (AZOR) 10-20 MG per tablet Take 1 tablet by mouth daily. Azor 10/72m once a day  30 tablet  0  . amoxicillin  (AMOXIL) 500 MG capsule Take 1 capsule by mouth 4 (four) times daily as needed.      . clopidogrel (PLAVIX) 75 MG tablet Take 1 tablet (75 mg total) by mouth daily.  30 tablet  1  . HYDROcodone-acetaminophen (NORCO/VICODIN) 5-325 MG per tablet Take 1 tablet by mouth daily as needed.      . insulin NPH-regular (HUMULIN 70/30) (70-30) 100 UNIT/ML injection Inject 40 Units into the skin 2 (two) times daily.  10 mL  12  . isosorbide mononitrate (IMDUR) 30 MG 24 hr tablet Take 1 tablet (30 mg total) by mouth daily.  30 tablet  5  . metFORMIN (GLUCOPHAGE) 500 MG tablet Take 1 tablet (500 mg total) by mouth 2 (two) times daily with a meal.      . metoprolol succinate (TOPROL-XL) 100 MG 24 hr tablet Take 1 tablet (100 mg total) by mouth daily. Take with or immediately following a meal.  30 tablet  6  . nitroGLYCERIN (NITROSTAT) 0.4 MG SL tablet Place 1 tablet (0.4 mg total) under the tongue every 5 (five) minutes as needed for chest pain.  25 tablet  3  . oxyCODONE-acetaminophen (PERCOCET/ROXICET) 5-325 MG per tablet Take 1-2 tablets by mouth every 4 (four) hours as needed for moderate pain or severe pain.  45 tablet  0  . rosuvastatin (CRESTOR) 40 MG tablet Take 1 tablet (40 mg total) by mouth daily.      .[name] Kitchenspironolactone (ALDACTONE) 25 MG tablet Take 0.5 tablets (12.5 mg total) by mouth daily.  30 tablet  9    Results for orders placed during the hospital encounter of 07/03/14 (from the past 48 hour(s))  CBC     Status: None   Collection Time    07/03/14 10:35 AM      Result Value Ref Range   WBC 6.7  4.0 - 10.5 K/uL   RBC 5.21  4.22 - 5.81 MIL/uL   Hemoglobin 15.1  13.0 - 17.0 g/dL   HCT 45.5  39.0 - 52.0 %   MCV 87.3  78.0 - 100.0 fL   MCH 29.0  26.0 - 34.0 pg   MCHC 33.2  30.0 - 36.0 g/dL   RDW 14.4  11.5 - 15.5 %   Platelets 194  150 - 400 K/uL  CK TOTAL AND CKMB     Status: Abnormal   Collection Time    07/03/14 10:35 AM      Result Value Ref Range   Total CK 492 (*) 7 - 232 U/L   CK,  MB 8.5 (*) 0.3 - 4.0 ng/mL   Comment: CRITICAL RESULT CALLED TO, READ BACK BY AND VERIFIED WITH:     TWINE S RCIS 07/03/14 1128 COSTELLO B   Relative Index 1.7  0.0 - 2.5  COMPREHENSIVE METABOLIC PANEL     Status: Abnormal   Collection Time    07/03/14 10:35 AM      Result Value Ref Range   Sodium 139  137 - 147 mEq/L   Potassium 4.0  3.7 -  5.3 mEq/L   Chloride 103  96 - 112 mEq/L   CO2 23  19 - 32 mEq/L   Glucose, Bld 251 (*) 70 - 99 mg/dL   BUN 13  6 - 23 mg/dL   Creatinine, Ser 1.02  0.50 - 1.35 mg/dL   Calcium 8.5  8.4 - 10.5 mg/dL   Total Protein 6.6  6.0 - 8.3 g/dL   Albumin 2.8 (*) 3.5 - 5.2 g/dL   AST 23  0 - 37 U/L   ALT 21  0 - 53 U/L   Alkaline Phosphatase 120 (*) 39 - 117 U/L   Total Bilirubin <0.2 (*) 0.3 - 1.2 mg/dL   GFR calc non Af Amer 81 (*) >90 mL/min   GFR calc Af Amer >90  >90 mL/min   Comment: (NOTE)     The eGFR has been calculated using the CKD EPI equation.     This calculation has not been validated in all clinical situations.     eGFR's persistently <90 mL/min signify possible Chronic Kidney     Disease.   Anion gap 13  5 - 15  LIPID PANEL     Status: Abnormal   Collection Time    07/03/14 10:35 AM      Result Value Ref Range   Cholesterol 215 (*) 0 - 200 mg/dL   Triglycerides 127  <150 mg/dL   HDL 90  >39 mg/dL   Total CHOL/HDL Ratio 2.4     VLDL 25  0 - 40 mg/dL   LDL Cholesterol 100 (*) 0 - 99 mg/dL   Comment:            Total Cholesterol/HDL:CHD Risk     Coronary Heart Disease Risk Table                         Men   Women      1/2 Average Risk   3.4   3.3      Average Risk       5.0   4.4      2 X Average Risk   9.6   7.1      3 X Average Risk  23.4   11.0                Use the calculated Patient Ratio     above and the CHD Risk Table     to determine the patient's CHD Risk.                ATP III CLASSIFICATION (LDL):      <100     mg/dL   Optimal      100-129  mg/dL   Near or Above                        Optimal      130-159   mg/dL   Borderline      160-189  mg/dL   High      >190     mg/dL   Very High  PROTIME-INR     Status: None   Collection Time    07/03/14 10:35 AM      Result Value Ref Range   Prothrombin Time 12.5  11.6 - 15.2 seconds   INR 0.93  0.00 - 1.49  APTT     Status: None   Collection Time    07/03/14 10:35 AM  Result Value Ref Range   aPTT 32  24 - 37 seconds  TROPONIN I     Status: None   Collection Time    07/03/14 10:35 AM      Result Value Ref Range   Troponin I <0.30  <0.30 ng/mL   Comment:            Due to the release kinetics of cTnI,     a negative result within the first hours     of the onset of symptoms does not rule out     myocardial infarction with certainty.     If myocardial infarction is still suspected,     repeat the test at appropriate intervals.  GLUCOSE, CAPILLARY     Status: Abnormal   Collection Time    07/03/14 12:31 PM      Result Value Ref Range   Glucose-Capillary 329 (*) 70 - 99 mg/dL   No results found.  Review of Systems  Constitutional: Negative for fever and diaphoresis.  HENT: Negative for congestion and sore throat.   Respiratory: Positive for shortness of breath. Negative for cough.   Cardiovascular: Positive for leg swelling. Negative for chest pain, orthopnea and PND.       The patient reports he is not having chest pain but he was having some kind of funny discomfort in his chest.  Gastrointestinal: Positive for nausea and abdominal pain (right upper quadrant). Negative for vomiting, blood in stool and melena.  Genitourinary: Negative for hematuria.  Musculoskeletal: Positive for back pain.  Neurological: Positive for dizziness (a little lightheadedness).  All other systems reviewed and are negative.   Blood pressure 140/73, pulse 73, temperature 98.5 F (36.9 C), temperature source Oral, resp. rate 18, SpO2 97.00%. Physical Exam  Nursing note and vitals reviewed. Constitutional: He is oriented to person, place, and time. He  appears well-developed. No distress.  Obese  HENT:  Head: Normocephalic and atraumatic.  Eyes: EOM are normal. Pupils are equal, round, and reactive to light. No scleral icterus.  Neck: Normal range of motion. Neck supple.  Cardiovascular: Normal rate, regular rhythm, S1 normal and S2 normal.   No murmur heard. Pulses:      Radial pulses are 2+ on the right side, and 2+ on the left side.  No carotid bruit  Respiratory: Effort normal and breath sounds normal. No respiratory distress. He has no wheezes. He has no rales.  GI: Soft. Bowel sounds are normal. He exhibits no distension. There is no tenderness.  Musculoskeletal: He exhibits no edema.  Lymphadenopathy:    He has no cervical adenopathy.  Neurological: He is alert and oriented to person, place, and time. He exhibits normal muscle tone.  Skin: Skin is warm and dry.  Psychiatric: He has a normal mood and affect.     Assessment/Plan Principal Problem:   Abnormal nuclear stress test Active Problems:   HYPERLIPIDEMIA   HYPERTENSION   Uncontrolled diabetes mellitus with peripheral autonomic neuropathy   Morbid obesity   CAD in native artery   Sleep apnea- on C-pap   Intermediate coronary syndrome   Plan: Patient was sent to [facility] for emergent cath. Continue diabetes medications. Patient will also be referred for outpatient nutritional therapy with a registered dietitian.  Continue CPAP at night.   Pedrohenrique Mccon[place], PA-C. 07/03/2014, 1:05 PM

## 2014-07-03 NOTE — Progress Notes (Signed)
I was asked as the DOD to evaluate Joshua [name] who presented for his nuclear stress test today. He does have a history of ST elevation MI in 2012 with inferior ST elevations which were convex and about 2 mm. At that time he denied any chest pain and was only mildly nauseated. He was surprised that he was sent for cardiac catheterization urgently. Recently he's been having more chest pain with exertion especially when mowing his lawn and doing certain activities. He says the symptoms feel very similar to symptoms he had prior to his first heart attack although they were very minimal. He is a long-standing diabetic over 25 years. He was seen in the office one week ago and his EKG showed only mild abnormalities. Today is her to be hypertensive with blood pressure 185/115. His EKG shows 3 mm of concave ST elevation inferiorly in 2,3 and aVF which is new. This was identified after his rest injections and pictures for his nuclear stress test. I briefly reviewed his images and there are abnormalities at rest. He is not describing significant chest pain this time however I'm concerned that he had no significant symptoms during his prior episode. He reports she's been off of aspirin and Plavix for 2 weeks due to recent dental extraction. I do not feel comfortable with him proceeding with the stress portion of his nuclear stress test. I reviewed the EKG with Dr. Gwenlyn Found and called and discussed the case with Dr. Pernell Dupre is the interventional list on call. The patient will be sent urgently to [facility] for cardiac catheterization.  Pixie Casino, MD, [facility] [place] Attending Cardiologist [place]

## 2014-07-03 NOTE — H&P (Signed)
Ms. Reich presents as an urgent from the [name] office because of transient ST elevation noted on his EKG while in the nuclear stress lab. Dr. Debara Pickett identified the abnormality and felt that he needed to have urgent catheterization. I have reviewed the electrocardiograms and agree with Dr. Lysbeth Penner assessment. Repeat catheterization and possible PCI were discussed with the patient and accepted. Risks including stroke, death, myocardial infarction, limb ischemia, bleeding, among others were discussed in detail and except.

## 2014-07-03 NOTE — Interval H&P Note (Signed)
Cath Lab Visit (complete for each Cath Lab visit)  Clinical Evaluation Leading to the Procedure:   ACS: Yes.    Non-ACS:    Anginal Classification: No Symptoms  Anti-ischemic medical therapy: Minimal Therapy (1 class of medications)  Non-Invasive Test Results: Intermediate-risk stress test findings: cardiac mortality 1-3%/year  Prior CABG: No previous CABG      History and Physical Interval Note:  07/03/2014 10:09 AM  Joshua [name]  has presented today for surgery, with the diagnosis of cp/urgent  The various methods of treatment have been discussed with the patient and family. After consideration of risks, benefits and other options for treatment, the patient has consented to  Procedure(s): LEFT HEART CATHETERIZATION WITH CORONARY ANGIOGRAM (N/A) as a surgical intervention .  The patient's history has been reviewed, patient examined, no change in status, stable for surgery.  I have reviewed the patient's chart and labs.  Questions were answered to the patient's satisfaction.     [name]

## 2014-07-03 NOTE — Progress Notes (Signed)
Utilization review completed.  

## 2014-07-04 ENCOUNTER — Telehealth: Payer: Self-pay | Admitting: Internal Medicine

## 2014-07-04 DIAGNOSIS — I200 Unstable angina: Secondary | ICD-10-CM

## 2014-07-04 LAB — BASIC METABOLIC PANEL
Anion gap: 11 (ref 5–15)
BUN: 13 mg/dL (ref 6–23)
CO2: 24 mEq/L (ref 19–32)
Calcium: 8.5 mg/dL (ref 8.4–10.5)
Chloride: 106 mEq/L (ref 96–112)
Creatinine, Ser: 1.06 mg/dL (ref 0.50–1.35)
GFR calc Af Amer: 89 mL/min — ABNORMAL LOW (ref >90)
GFR calc non Af Amer: 77 mL/min — ABNORMAL LOW (ref >90)
Glucose, Bld: 208 mg/dL — ABNORMAL HIGH (ref 70–99)
Potassium: 4.3 mEq/L (ref 3.7–5.3)
Sodium: 141 mEq/L (ref 137–147)

## 2014-07-04 LAB — CBC
HCT: 40.8 % (ref 39.0–52.0)
Hemoglobin: 13.7 g/dL (ref 13.0–17.0)
MCH: 28.9 pg (ref 26.0–34.0)
MCHC: 33.6 g/dL (ref 30.0–36.0)
MCV: 86.1 fL (ref 78.0–100.0)
Platelets: 179 10*3/uL (ref 150–400)
RBC: 4.74 MIL/uL (ref 4.22–5.81)
RDW: 14.3 % (ref 11.5–15.5)
WBC: 6.2 10*3/uL (ref 4.0–10.5)

## 2014-07-04 LAB — GLUCOSE, CAPILLARY: Glucose-Capillary: 193 mg/dL — ABNORMAL HIGH (ref 70–99)

## 2014-07-04 MED ORDER — METFORMIN HCL 500 MG PO TABS: 500.0000 mg | ORAL_TABLET | Freq: Two times a day (BID) | ORAL | Status: DC

## 2014-07-04 MED ORDER — AMLODIPINE-OLMESARTAN 10-40 MG PO TABS: 1.0000 | ORAL_TABLET | Freq: Every day | ORAL | Status: DC

## 2014-07-04 MED ORDER — TICAGRELOR 90 MG PO TABS: 90.0000 mg | ORAL_TABLET | Freq: Two times a day (BID) | ORAL | Status: DC

## 2014-07-04 MED FILL — Sodium Chloride IV Soln 0.9%: INTRAVENOUS | Qty: 50 | Status: AC

## 2014-07-04 NOTE — Progress Notes (Signed)
Subjective: Feels better. Ready to go home.  Objective: Vital signs in last 24 hours: Temp:  [97.6 F (36.4 C)-98.5 F (36.9 C)] 97.6 F (36.4 C) (07/23 0001) Pulse Rate:  [61-77] 61 (07/23 0001) Resp:  [16-20] 20 (07/23 0400) BP: (96-168)/(39-122) 138/73 mmHg (07/23 0500) SpO2:  [81 %-100 %] 100 % (07/23 0400) Weight:  [305 lb (138.347 kg)-306 lb 7 oz (139 kg)] 306 lb 7 oz (139 kg) (07/23 0001) Last BM Date: 07/02/14  Intake/Output from previous day: 07/22 0701 - 07/23 0700 In: 1340 [P.O.:1280; I.V.:60] Out: 2700 [Urine:2700] Intake/Output this shift: Total I/O In: 480 [P.O.:480] Out: 2300 [Urine:2300]  Medications Current Facility-Administered Medications  Medication Dose Route Frequency Provider Last Rate Last Dose  . acetaminophen (TYLENOL) tablet 650 mg  650 mg Oral Q4H PRN Belva Crome III, MD   650 mg at 07/03/14 1632  . allopurinol (ZYLOPRIM) tablet 300 mg  300 mg Oral Daily Belva Crome III, MD   300 mg at 07/03/14 1829  . amLODipine (NORVASC) tablet 10 mg  10 mg Oral Daily Belva Crome III, MD   10 mg at 07/03/14 1829   And  . irbesartan (AVAPRO) tablet 150 mg  150 mg Oral Daily Belva Crome III, MD   150 mg at 07/03/14 1831  . aspirin chewable tablet 81 mg  81 mg Oral Daily Belva Crome III, MD      . atorvastatin (LIPITOR) tablet 80 mg  80 mg Oral q1800 Belva Crome III, MD   80 mg at 07/03/14 1829  . HYDROcodone-acetaminophen (NORCO/VICODIN) 5-325 MG per tablet 1 tablet  1 tablet Oral Daily PRN Belva Crome III, MD      . insulin aspart (novoLOG) injection 0-15 Units  0-15 Units Subcutaneous TID WC Belva Crome III, MD      . insulin aspart (novoLOG) injection 0-5 Units  0-5 Units Subcutaneous QHS Belva Crome III, MD      . metoprolol succinate (TOPROL-XL) 24 hr tablet 100 mg  100 mg Oral Daily Belva Crome III, MD   100 mg at 07/03/14 1832  . nitroGLYCERIN (NITROSTAT) SL tablet 0.4 mg  0.4 mg Sublingual Q5 min PRN Belva Crome III, MD      .  ondansetron [facility]) injection 4 mg  4 mg Intravenous Q6H PRN Belva Crome III, MD      . spironolactone (ALDACTONE) tablet 12.5 mg  12.5 mg Oral Daily Belva Crome III, MD   12.5 mg at 07/03/14 1832  . ticagrelor (BRILINTA) tablet 90 mg  90 mg Oral BID Belva Crome III, MD   90 mg at 07/03/14 2135    PE: General appearance: alert, cooperative and no distress Lungs: clear to auscultation bilaterally Heart: regular rate and rhythm, S1, S2 normal, no murmur, click, rub or gallop Extremities: No LEE Pulses: 2+ and symmetric Skin: Warm and dry.  Right wrist:  No hematoma or ecchymosis.  Neurologic: Grossly normal  Lab Results:   Recent Labs  07/03/14 1035 07/03/14 1036  WBC 6.7  --   HGB 15.1 13.6  HCT 45.5 40.0  PLT 194  --    BMET  Recent Labs  07/03/14 1035 07/03/14 1036  NA 139 134*  K 4.0 3.7  CL 103 117*  CO2 23  --   GLUCOSE 251* 260*  BUN 13 11  CREATININE 1.02 1.10  CALCIUM 8.5  --    PT/INR  Recent  Labs  07/03/14 1035  LABPROT 12.5  INR 0.93   Cholesterol  Recent Labs  07/03/14 1035  CHOL 215*   Lipid Panel     Component Value Date/Time   CHOL 215* 07/03/2014 1035   TRIG 127 07/03/2014 1035   HDL 90 07/03/2014 1035   CHOLHDL 2.4 07/03/2014 1035   VLDL 25 07/03/2014 1035   LDLCALC 100* 07/03/2014 1035     Assessment/Plan   Principal Problem:   Abnormal nuclear stress test Active Problems:   HYPERLIPIDEMIA   HYPERTENSION   Uncontrolled diabetes mellitus with peripheral autonomic neuropathy   Morbid obesity   CAD in native artery   Sleep apnea- on C-pap   Intermediate coronary syndrome   Plan:  SP LHC and successful drug-eluting stent implantation in the distal right coronary with reduction and 90% stenosis to 0% with TIMI grade 3 flow  Successful drug-eluting stent implantation in the proximal LAD with reduction 95% stenosis to 0% with TIMI grade 3 flow  Moderate stenosis in the mid circumflex.  Inferior hypokinesis with preserved  systolic function.  ASA, brilinta.  Statin, toprol 100, aldactone, irbesartan.  BP controlled.  Only the last reading is high.  No changes in therapy.  SS insulin for DM.  Continue home insulin dosing.  Needs follow up with PCP.  A1C 10.  Noncompliance.  OP Nutrition referral and diabetes education.  SCr stable.    DC home today.    LOS: 1 day    HAGER, BRYAN PA-C 07/04/2014 6:07 AM  Patient seen, examined. Available data reviewed. Agree with findings, assessment, and plan as outlined by Tarri Fuller, PA-C. No chest pain overnight. Right radial site clear. Heart RRR without murmur, lungs CTA. Notes reviewed and patient stable for discharge today. Would return to work week after next at full capacity. Importance of diet, med adherence reviewed.  Sherren Mocha, M.D. 07/04/2014 9:19 AM

## 2014-07-04 NOTE — Discharge Summary (Signed)
Physician Discharge Summary      Patient ID: Joshua [name] MRN: [id number] DOB/AGE: Dec 23, 1955 58 y.o.  Admit date: 07/03/2014 Discharge date: 07/04/2014  Admission Diagnoses:  Abnormal nuclear stress test.  Discharge Diagnoses:  Principal Problem:   Abnormal nuclear stress test Active Problems:   HYPERLIPIDEMIA   HYPERTENSION   Uncontrolled diabetes mellitus with peripheral autonomic neuropathy   Morbid obesity   CAD in native artery   Sleep apnea- on C-pap   Intermediate coronary syndrome   Discharged Condition: stable  Hospital Course:   The patient is a 58 year old obese African American male with history of uncontrolled diabetes, hypertension, hyperlipidemia, GERD, diabetic gastroparesis, polyneuropathy, coronary artery disease. 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 He had a STEMI on 10/31/2011 with ECG findings of anterolateral ST segment elevation. Catheterization revealed 99% mid LAD stenosis proximal to this there was a 60-70% stenosis after the first diagonal vessel. He also had 95% mid RCA stenosis and 30% circumflex stenosis. He underwent initial acute intervention to his LAD and a 3.0x32 mm Promus DES stent. 3 days later he underwent successful staged PCI to his 95% RCA stenosis and a 3.0x18 mm Resolute DES stent.   Patient reports that for about a month now he's noticed exertional dyspnea when he walks across the parking lot the stairs at work where before that he was not having any problems. He also noticed a funny feeling in his chest he would not quite described as pain or tightness but noticeable. He also states this is similar to prior episodes at which time he had a T. elevation MI. He also reports some nausea but no vomiting. He says some right upper quadrant pain but this occurred after he leaned over some rollers at work and may have strained a muscle. This symptom has resolved for the most part. He denies diaphoresis, orthopnea, PND,  hematochezia, melena. Patient was seen for nuclear stress test at the [facility] office and EKG changes consistent with ischemia. He was sent directly for cardiac catheterization prior to having stress images.  Patient was sent to [facility] for emergent cath.  Diabetes medications continued. Patient will be referred for outpatient nutritional therapy with a registered dietitian. Continued CPAP at night.  LHC resulted in successful drug-eluting stent implantation in the distal right coronary with reduction and 90% stenosis to 0% with TIMI grade 3 flow.  Successful drug-eluting stent implantation in the proximal LAD with reduction 95% stenosis to 0% with TIMI grade 3 flow Moderate stenosis in the mid circumflex. Inferior hypokinesis with preserved systolic function. ASA, brilinta. Statin, toprol 100, aldactone, irbesartan. BP controlled.  No changes in therapy. SS insulin for DM. Continue home insulin dosing at DC. Needs follow up with PCP. A1C 10. Noncompliance. OP Nutrition referral and diabetes education. SCr stable.  The patient was seen by Dr. Burt Knack who felt he was stable for DC home.     Consults: Cardiac rehab  Significant Diagnostic Studies:   Left Heart Catheterization with Coronary Angiography and PCI Report  JAKYRI BRUNKHORST  58 y.o.  male  10/09/1956  Procedure Date: 07/03/2014  Referring Physician: Mali Hilty, M.D.  Primary Cardiologist: [name], M.D.  INDICATIONS: Unstable angina pectoris with intermittent inferior lead ST elevation  PROCEDURE: 1. Left heart catheterization; 2. Coronary angiography; 3. Left ventriculography; 4. DES distal RCA and proximal LAD  CONSENT:  The risks, benefits, and details of the procedure were explained in detail to the patient. Risks including death, stroke,  heart attack, kidney injury, allergy, limb ischemia, bleeding and radiation injury were discussed. The patient verbalized understanding and wanted to proceed. Informed written consent  was obtained.  PROCEDURE TECHNIQUE: After Xylocaine anesthesia a 5 French Slender sheath was placed in the right radial artery with an angiocath and the modified Seldinger technique. Coronary angiography was done using a 5 F JR 4 and JL 3.5 cm catheter. Left ventriculography was done using the JR 4 catheter and hand injection.  Digital images demonstrated high-grade distal RCA stenosis beyond the previously placed stent. Smooth 30-40% diffuse ISR in the mid RCA stent. The LAD stent was widely patent but proximal to the stent margin was in 85-95% stenosis likely representing ISR due to Balloon margin trauma.  I have not discussion with this diabetic patient. The previously placed stents have not failed over 3 years. These other notable lesions. His treatment option could include coronary bypass grafting for stenting. After discussing with the patient we decided to go with percutaneous coronary intervention as he was opposed to consideration of bypass surgery at his young age. We discussed a period of time for him to contemplate the appropriate treatment option. He wanted to proceed. I concurred with this decision had PCI as it seemed the most appropriate management strategy in this obese diabetic who is only 58 years of age. Each lesion was a type A lesion with expected excellent angiographic outcome after stenting.  We upgraded to a JR 4 guide catheter. A bolus and infusion a bivalirudin was given.Brilinta 180 mg loading dose was given. ACT was documented greater than 300 seconds. After guiding shots were obtained, we perform PCI of the distal right coronary using a 0.014 BMW coronary wire. Predilatation was performed with a 2.5 x 12 mm long Euphora. After predilatation a 16 x 2.75 mm Promus Premier was positioned and deployed. Postdilatation was performed with a 3.25 x 12 mm long [place] Euphora. A very nice angiographic result was obtained.  We then turned our attention to the LAD. A 6 French XB LAD guide catheter  was used to obtain guiding shots. The same BMW wire was used to cross the stenosis. The same predilatation balloon was used to prepare the culprit site in the LAD. We then positioned and deployed a 16 x 3.5 mm Promus Premier and post dilated with a 3.75 x 12 mm [place] to 14 atmospheres x2 inflations. We did stent across a diagonal branch. No evidence of slow flow was noted in the diagonal. A nice angiographic result was obtained.  The radial sheath was removed and hemostasis achieved with a wrist band.  Full-strength biva lirudin infusion will be continued until the current IV bag is depleted.  CONTRAST: Total of 260 cc.  COMPLICATIONS: None  HEMODYNAMICS: Aortic pressure 158/90 mmHg; LV pressure 160/15 mmHg; LVEDP 23 mmHg  ANGIOGRAPHIC DATA: The left main coronary artery is widely patent.  The left anterior descending artery is patent stent in the midsegment. Proximal to the stent there is 95% stenosis at the proximal stent margin.  The left circumflex artery is widely patent.. The mid vessel after the first obtuse marginal contains 50-70% stenosis before the large second and third obtuse marginal branches.  The right coronary artery is widely patent, particularly the mid RCA stent. Beyond the stent in the distal segment there is an eccentric 90% stenosis. The RCA beyond this is diffusely diseased but without significant obstruction. It ends on a large PDA.  PCI RESULTS: The distal RCA stenosis was reduced from  90% to 0% after PTCA and stenting using DES and post dilated to 3.25 mm in diameter.  The proximal LAD lesion was reduced from 95% to 0% after PTCA and stenting with DES and post dilated to 3.75 mm in diameter  LEFT VENTRICULOGRAM: Left ventricular angiogram was done in the 30 RAO projection and revealed inferior hypokinesis with an EF of 50-55%.  IMPRESSIONS: 1. Unstable angina pectoris with transient inferior ST elevation.  2. Successful drug-eluting stent implantation in the distal right  coronary with reduction and 90% stenosis to 0% with TIMI grade 3 flow  3. Successful drug-eluting stent implantation in the proximal LAD with reduction 95% stenosis to 0% with TIMI grade 3 flow  4. Moderate stenosis in the mid circumflex  5. Inferior hypokinesis with preserved systolic function  RECOMMENDATION: Aggressive risk factor modification to include diabetes control, hypertension control, weight loss, and lipid management.  Aspirin and Brilinta. Will need lifelong dual antiplatelet therapy. This which Plavix in 2-3 months.  Probable discharge in a.m.    Treatments: See above  Discharge Exam: Blood pressure 173/80, pulse 71, temperature 97.5 F (36.4 C), temperature source Oral, resp. rate 18, weight 306 lb 7 oz (139 kg), SpO2 98.00%.   Disposition: 01-Home or Self Care      Discharge Instructions   Amb Referral to Cardiac Rehabilitation    Complete by:  As directed      Diet - low sodium heart healthy    Complete by:  As directed      Discharge instructions    Complete by:  As directed   No lifting with your right arm for three days.     Increase activity slowly    Complete by:  As directed             Medication List    STOP taking these medications       clopidogrel 75 MG tablet  Commonly known as:  PLAVIX      TAKE these medications       allopurinol 300 MG tablet  Commonly known as:  ZYLOPRIM  Take 300 mg by mouth daily.     amlodipine-olmesartan 10-20 MG per tablet  Commonly known as:  AZOR  Take 1 tablet by mouth daily. Azor 10/$RemoveBefore'40mg'NIpoYKcREvfVN$  once a day     aspirin EC 81 MG tablet  Take 81 mg by mouth daily.     insulin NPH-regular Human (70-30) 100 UNIT/ML injection  Commonly known as:  HUMULIN 70/30  Inject 40 Units into the skin 2 (two) times daily.     isosorbide mononitrate 30 MG 24 hr tablet  Commonly known as:  IMDUR  Take 1 tablet (30 mg total) by mouth daily.     metFORMIN 500 MG tablet  Commonly known as:  GLUCOPHAGE  Take 1 tablet (500  mg total) by mouth 2 (two) times daily with a meal.     metoprolol succinate 100 MG 24 hr tablet  Commonly known as:  TOPROL-XL  Take 1 tablet (100 mg total) by mouth daily. Take with or immediately following a meal.     nitroGLYCERIN 0.4 MG SL tablet  Commonly known as:  NITROSTAT  Place 1 tablet (0.4 mg total) under the tongue every 5 (five) minutes as needed for chest pain.     rosuvastatin 40 MG tablet  Commonly known as:  CRESTOR  Take 1 tablet (40 mg total) by mouth daily.     spironolactone 25 MG tablet  Commonly known as:  ALDACTONE  Take 0.5 tablets (12.5 mg total) by mouth daily.     ticagrelor 90 MG Tabs tablet  Commonly known as:  BRILINTA  Take 1 tablet (90 mg total) by mouth 2 (two) times daily.       Follow-up Information   Follow up with Mainor Hellmann, PA-C On 07/22/2014. (The office will call you with the time.)    Specialty:  Physician Assistant   Contact information:   [address] [place] [address] [phone]       Greater than 30 minutes was spent completing the patient's discharge.    SignedTarri Fuller, PA-C 07/04/2014, 9:44 AM

## 2014-07-04 NOTE — Care Management Note (Signed)
    Page 1 of 1   07/04/2014     10:24:25 AM CARE MANAGEMENT NOTE 07/04/2014  Patient:  Joshua [name], Joshua [name]   Account Number:  [id number]  Date Initiated:  07/04/2014  Documentation initiated by:  [facility]  Subjective/Objective Assessment:   58 yo male admitted with unstable angina pectoris with intermittent inferior lead ST elevation. //Home with spouse.     Action/Plan:   LHC and successful drug-eluting stent implantation in the distal right coronary. //Benefits check for Brilinta $RemoveBefo'90mg'LroabupwQtR$  BID   Anticipated DC Date:  07/04/2014   Anticipated DC Plan:  [place]  CM consult  Medication Assistance      Choice offered to / List presented to:             Status of service:  Completed, signed off Medicare Important Message given?   (If response is "NO", the following Medicare IM given date fields will be blank) Date Medicare IM given:   Medicare IM given by:   Date Additional Medicare IM given:   Additional Medicare IM given by:    Discharge Disposition:  HOME/SELF CARE  Per UR Regulation:  Reviewed for med. necessity/level of care/duration of stay  If discussed at [place] of Stay Meetings, dates discussed:    Comments:  7/223/15 [place] Ethin Drummond J. Clydene Laming, RN, BSN, [employer] [phone] Spoke with pt at bedside regarding benefits check for Brilinta.  Pt has brochure with 30 day free card and refill assistance card intact.  Pt utilizes [facility] on [address] for prescription needs.  NCM called pharmacy to confirm availability of medication.  Information relayed to pt.  Pt verbalizes importance of filling medication upon discharge.

## 2014-07-04 NOTE — Progress Notes (Signed)
TR BAND REMOVAL  LOCATION:    right radial  DEFLATED PER PROTOCOL:    Yes.    TIME BAND OFF / DRESSING APPLIED:    1700   SITE UPON ARRIVAL:    Level 0  SITE AFTER BAND REMOVAL:    Level 0  CIRCULATION SENSATION AND MOVEMENT:    Within Normal Limits   Yes.    COMMENTS:   Gauze dressing applied at 1700. Rechecked at 1730 with no change in assessment. CSMs remain wnls, ulnar and radial pulses +2 and dressing dry and intact

## 2014-07-04 NOTE — Progress Notes (Signed)
[id number] Cardiac Rehab Pt states that he has walked in hall denies any cp or SOB. Completed stent discharge education with pt. He voices understanding. Pt agrees to Outpt CPR in [place], will send referral. Deon Pilling, RN 07/04/2014 8:52 AM

## 2014-07-05 NOTE — Telephone Encounter (Signed)
Closed encounter °

## 2014-07-09 ENCOUNTER — Ambulatory Visit: Payer: Self-pay | Admitting: Cardiology

## 2014-07-09 ENCOUNTER — Telehealth: Payer: Self-pay | Admitting: Physician Assistant

## 2014-07-09 NOTE — Telephone Encounter (Signed)
[name] called inquiring about what should be done as receiving a work note for the rest of the week because the referring Dr. Burt Knack recommended that he not go back to work for the rest of the week. Please Call   Thanks

## 2014-07-09 NOTE — Telephone Encounter (Signed)
Patient needs clarification regarding the note he has about returning to work.

## 2014-07-09 NOTE — Telephone Encounter (Signed)
according to the progress note from dr cooper. The pt can return to work 07-15-14, with no restrictions. Left message for pt to call

## 2014-07-09 NOTE — Telephone Encounter (Signed)
patient states Dr Burt Knack wanted to stay out of work for a week. Patient states he had 2 stents placed  Patient had note given to him to return to work on 07/08/14 last night by Tarri Fuller PA. He would like to know what to do ?  Will defer to [facility]?

## 2014-07-10 ENCOUNTER — Encounter: Payer: Self-pay | Admitting: *Deleted

## 2014-07-10 NOTE — Telephone Encounter (Signed)
Spoke with pt, letter to be generated and placed at the front desk for pick up

## 2014-07-15 ENCOUNTER — Telehealth: Payer: Self-pay | Admitting: Cardiovascular Disease

## 2014-07-15 NOTE — Telephone Encounter (Signed)
Patient notified of "no work restrictions" per work note composed on 7/29 in epic.

## 2014-07-15 NOTE — Telephone Encounter (Signed)
Joshua [name] is calling because he suppose to got to work tonight and wants to know if there are any limitations to what he can and cannot do .[name] KitchenPlease call

## 2014-07-17 ENCOUNTER — Telehealth: Payer: Self-pay | Admitting: *Deleted

## 2014-07-17 NOTE — Telephone Encounter (Signed)
Faxed cardiac rehab prescription to [place] .

## 2014-07-22 ENCOUNTER — Encounter: Payer: Self-pay | Admitting: Physician Assistant

## 2014-07-22 ENCOUNTER — Ambulatory Visit (INDEPENDENT_AMBULATORY_CARE_PROVIDER_SITE_OTHER): Payer: BC Managed Care – PPO | Admitting: Physician Assistant

## 2014-07-22 VITALS — BP 170/90 | HR 71 | Ht 69.0 in | Wt 296.1 lb

## 2014-07-22 DIAGNOSIS — I2510 Atherosclerotic heart disease of native coronary artery without angina pectoris: Secondary | ICD-10-CM

## 2014-07-22 DIAGNOSIS — E785 Hyperlipidemia, unspecified: Secondary | ICD-10-CM

## 2014-07-22 DIAGNOSIS — I10 Essential (primary) hypertension: Secondary | ICD-10-CM

## 2014-07-22 MED ORDER — HYDRALAZINE HCL 25 MG PO TABS: 25.0000 mg | ORAL_TABLET | Freq: Three times a day (TID) | ORAL | Status: DC

## 2014-07-22 NOTE — Patient Instructions (Signed)
1. Start taking hydralazine 25 mg 3 times daily 2.  followup in 2 weeks for blood pressure check

## 2014-07-22 NOTE — Assessment & Plan Note (Signed)
On statin.

## 2014-07-22 NOTE — Assessment & Plan Note (Signed)
Appear stable with no angina. On aspirin and Brilinta and 30 mg of Imdur.

## 2014-07-22 NOTE — Assessment & Plan Note (Signed)
Patient has been referred for medical nutrition therapy with a registered dietitian.

## 2014-07-22 NOTE — Assessment & Plan Note (Addendum)
BP is not controlled. We'll add hydralazine 25 mg 3 times daily. He artery takes Toprol, Azor, 10 mg of amlodipine.  He will come back in 2 weeks for blood pressure check.

## 2014-07-22 NOTE — Progress Notes (Signed)
Date:  07/22/2014   ID:  Joshua [name], DOB Jan 03, 1956, MRN [id number]  PCP:  [name], MD  Primary Cardiologist:  Claiborne Billings     History of Present Illness: Joshua [name] is a 58 y.o. male  The patient is a 43 year old obese African American male with history of uncontrolled diabetes, hypertension, hyperlipidemia, GERD, diabetic gastroparesis, polyneuropathy, coronary artery disease. 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 He had a STEMI on 10/31/2011 with ECG findings of anterolateral ST segment elevation. Catheterization revealed 99% mid LAD stenosis proximal to this there was a 60-70% stenosis after the first diagonal vessel. He also had 95% mid RCA stenosis and 30% circumflex stenosis. He underwent initial acute intervention to his LAD and a 3.0x32 mm Promus DES stent. 3 days later he underwent successful staged PCI to his 95% RCA stenosis and a 3.0x18 mm Resolute DES stent.   Patient was seen for nuclear stress test at the [facility] office on 07/03/14 and EKG changes were consistent with ischemia. He was sent directly for cardiac catheterization prior to having stress images.  LHC resulted in successful drug-eluting stent implantation in the distal right coronary with reduction and 90% stenosis to 0% with TIMI grade 3 flow. Successful drug-eluting stent implantation in the proximal LAD with reduction 95% stenosis to 0% with TIMI grade 3 flow Moderate stenosis in the mid circumflex. Inferior hypokinesis with preserved systolic function. ASA, brilinta. Statin, toprol 100, aldactone, irbesartan. BP controlled.   Patient presents today for posthospital evaluation. He states that sometimes it "feels like food gets stuck". This is an old finding. He also reports having some shortness of breath the other morning but that has resolved. I did reemphasize that Brilinta can cause shortness of breath.  Does report some mild left lower rib pain which was worse with movement.   He also states that he supposed to have a colonoscopy later this month.  The patient currently denies nausea, vomiting, fever, chest pain, orthopnea, dizziness, PND, cough, congestion, abdominal pain, hematochezia, melena, lower extremity edema, .  Wt Readings from Last 3 Encounters:  07/22/14 296 lb 1.6 oz (134.31 kg)  07/04/14 306 lb 7 oz (139 kg)  07/03/14 305 lb (138.347 kg)     Past Medical History  Diagnosis Date  . Hypertension   . Hyperlipemia   . GERD (gastroesophageal reflux disease)   . Polyneuropathy in diabetes(357.2)   . Coronary artery disease   . CHF (congestive heart failure)   . Heart murmur   . Myocardial infarction 2012  . OSA on CPAP   . Pulmonary sarcoidosis     "problems w/it years ago; no problems anymore" (07/03/2014)  . Uncontrolled diabetes mellitus with complications     "type ii"  . Diabetic gastroparesis   . Gout     Current Outpatient Prescriptions  Medication Sig Dispense Refill  . allopurinol (ZYLOPRIM) 300 MG tablet Take 300 mg by mouth daily.       [name] amLODipine-olmesartan (AZOR) 10-40 MG per tablet Take 1 tablet by mouth daily.  30 tablet  5  . aspirin EC 81 MG tablet Take 81 mg by mouth daily.      . insulin NPH-regular (HUMULIN 70/30) (70-30) 100 UNIT/ML injection Inject 40 Units into the skin 2 (two) times daily.  10 mL  12  . isosorbide mononitrate (IMDUR) 30 MG 24 hr tablet Take 1 tablet (30 mg total) by mouth daily.  30 tablet  5  . metFORMIN (GLUCOPHAGE)  500 MG tablet Take 1 tablet (500 mg total) by mouth 2 (two) times daily with a meal.      . metoprolol succinate (TOPROL-XL) 100 MG 24 hr tablet Take 1 tablet (100 mg total) by mouth daily. Take with or immediately following a meal.  30 tablet  6  . nitroGLYCERIN (NITROSTAT) 0.4 MG SL tablet Place 1 tablet (0.4 mg total) under the tongue every 5 (five) minutes as needed for chest pain.  25 tablet  3  . rosuvastatin (CRESTOR) 40 MG tablet Take 1 tablet (40 mg total) by mouth daily.        [name] spironolactone (ALDACTONE) 25 MG tablet Take 0.5 tablets (12.5 mg total) by mouth daily.  30 tablet  9  . ticagrelor (BRILINTA) 90 MG TABS tablet Take 1 tablet (90 mg total) by mouth 2 (two) times daily.  60 tablet  10  . hydrALAZINE (APRESOLINE) 25 MG tablet Take 1 tablet (25 mg total) by mouth 3 (three) times daily.  90 tablet  3   No current facility-administered medications for this visit.    Allergies:   No Known Allergies  Social History:  The patient  reports that he has never smoked. He has never used smokeless tobacco. He reports that he drinks alcohol. He reports that he does not use illicit drugs.   Family history:  History reviewed. No pertinent family history.  ROS:  Please see the history of present illness.  All other systems reviewed and negative.   PHYSICAL EXAM: VS:  BP 170/90  Pulse 71  Ht $R'5\' 9"'QS$  (1.753 m)  Wt 296 lb 1.6 oz (134.31 kg)  BMI 43.71 kg/m2 Obese, well developed, in no acute distress HEENT: Pupils are equal round react to light accommodation extraocular movements are intact.  Neck: no JVDNo cervical lymphadenopathy. Cardiac: Regular rate and rhythm without murmurs rubs or gallops. Lungs:  clear to auscultation bilaterally, no wheezing, rhonchi or rales Abd: soft, nontender, positive bowel sounds all quadrants, no hepatosplenomegaly Ext: no lower extremity edema.  2+ radial and dorsalis pedis pulses. Skin: warm and dry Neuro:  Grossly normal  EKG:  Normal sinus rhythm rate 71 beats per minute ST abnormalities and to V6 unchanged poor R wave progression  ASSESSMENT AND PLAN:  Problem List Items Addressed This Visit   HYPERLIPIDEMIA (Chronic)     On statin.    Relevant Medications      hydrALAZINE (APRESOLINE) tablet   CAD in native artery - Primary (Chronic)     Appear stable with no angina. On aspirin and Brilinta and 30 mg of Imdur.    Relevant Medications      hydrALAZINE (APRESOLINE) tablet   Other Relevant Orders      EKG 12-Lead    HYPERTENSION      BP is not controlled. We'll add hydralazine 25 mg 3 times daily. He artery takes Toprol, Azor, 10 mg of amlodipine.  He will come back in 2 weeks for blood pressure check.    Relevant Medications      hydrALAZINE (APRESOLINE) tablet   Morbid obesity     Patient has been referred for medical nutrition therapy with a registered dietitian.      I sent Dr. Michail Sermon a message regarding upcoming colonoscopy and the fact that the patient will NOT be able to discontinue Brilinta for a period of one year.

## 2014-07-26 ENCOUNTER — Telehealth: Payer: Self-pay | Admitting: *Deleted

## 2014-07-26 NOTE — Telephone Encounter (Signed)
Returned cardiac rehab phase II prescription.

## 2014-08-05 ENCOUNTER — Other Ambulatory Visit: Payer: Self-pay

## 2014-08-05 ENCOUNTER — Ambulatory Visit: Payer: Self-pay | Admitting: Pharmacist Clinician (PhC)/ Clinical Pharmacy Specialist

## 2014-08-05 MED ORDER — SPIRONOLACTONE 25 MG PO TABS: 12.5000 mg | ORAL_TABLET | Freq: Every day | ORAL | Status: DC

## 2014-08-05 NOTE — Telephone Encounter (Signed)
Rx was sent to pharmacy electronically. 

## 2014-08-06 ENCOUNTER — Other Ambulatory Visit: Payer: Self-pay | Admitting: *Deleted

## 2014-11-20 ENCOUNTER — Encounter (HOSPITAL_COMMUNITY): Payer: Self-pay | Admitting: Cardiovascular Disease

## 2014-11-21 ENCOUNTER — Encounter (HOSPITAL_COMMUNITY): Payer: Self-pay | Admitting: Interventional Cardiology

## 2015-01-21 ENCOUNTER — Ambulatory Visit (INDEPENDENT_AMBULATORY_CARE_PROVIDER_SITE_OTHER): Payer: BLUE CROSS/BLUE SHIELD

## 2015-01-21 ENCOUNTER — Ambulatory Visit (INDEPENDENT_AMBULATORY_CARE_PROVIDER_SITE_OTHER): Payer: BLUE CROSS/BLUE SHIELD | Admitting: Podiatry

## 2015-01-21 ENCOUNTER — Encounter: Payer: Self-pay | Admitting: Podiatry

## 2015-01-21 VITALS — BP 155/97 | HR 83 | Resp 16 | Ht 69.0 in | Wt 306.0 lb

## 2015-01-21 DIAGNOSIS — E1169 Type 2 diabetes mellitus with other specified complication: Secondary | ICD-10-CM

## 2015-01-21 DIAGNOSIS — L97511 Non-pressure chronic ulcer of other part of right foot limited to breakdown of skin: Secondary | ICD-10-CM

## 2015-01-21 MED ORDER — SILVER SULFADIAZINE 1 % EX CREA: 1.0000 "application " | TOPICAL_CREAM | Freq: Every day | CUTANEOUS | Status: DC

## 2015-01-21 NOTE — Patient Instructions (Signed)
Continue daily dressing changes as instructed. Start Antibiotics Monitor for any signs/symptoms of infection. Call the office immediately if any occur or go directly to the emergency room. Call with any questions/concerns.

## 2015-01-21 NOTE — Progress Notes (Signed)
   Subjective:    Patient ID: Joshua [name], male    DOB: 1956/02/28, 59 y.o.   MRN: [id number]  HPI Comments: 59 year old male presents the office today with complaints of right big toe wound/infection. He has a history of amputations to the right foot. He states that he previously saw his primary care physician yesterday and he was prescribed antibiotics however he has not started to take them. He states that he has had a large callus on the bottom of his right big toe for a long period time. He said no prior treatment for the wound. He states his last HbA1c was 9. He denies any systemic complaints as fevers, chills, nausea, vomiting. No other complaints at this time.      Review of Systems  Endocrine:       Diabetes   All other systems reviewed and are negative.      Objective:   Physical Exam AAO 3, NAD DP/PT pulses palpable, CRT less than 3 seconds Protective sensation decreased with Simms Weinstein monofilament, decreased vibratory sensation. On the plantar aspect of the right hallux there is a large hyperkeratotic lesion with evidence of dried blood within the lesion. Upon debridement underlying ulceration was identified with macerated periwound. The actual wound measured approximately 1 x 1 cm with a granular wound base. There is malodor to the wound. There is no drainage or purulence identified from the wounds therefore no culture was obtained. There is no surrounding erythema, ascending cellulitis, fluctuance, crepitus. There is no probing, undermining, tunneling. Hyperkeratotic lesions bilateral sub-metatarsal 5 No other open lesions or pre-ulcer lesions identified. No pain with calf compression, swelling, warmth, erythema.       Assessment & Plan:  59 year old male right hallux ulceration -X-rays were obtained and reviewed with the patient. -Treatment options were discussed including alternatives, risks, complications. -Strongly recommended the patient to start  antibiotics which were prescribed him yesterday. -The lesion was sharply debrided without complications to remove hyperkeratotic tissue to reveal underlying macerated ulceration. There is no drainage or purulence and there was no probing therefore no culture was obtained. -Silvadene was applied to the wound followed by dry sterile dressing. Patient to continue this on a daily basis and he can wash the wound daily with antibacterial soap dried thoroughly then applying the ointment. -Follow-up in 1 week or sooner if any problems are to arise. Strongly encouraged the patient that he have any signs or symptoms of worsening infection good directly to the emergency room. In the meantime, Kirsten call the office with any questions, concerns, change in symptoms.

## 2015-01-27 ENCOUNTER — Encounter: Payer: Self-pay | Admitting: Podiatry

## 2015-01-28 ENCOUNTER — Ambulatory Visit (INDEPENDENT_AMBULATORY_CARE_PROVIDER_SITE_OTHER): Payer: BLUE CROSS/BLUE SHIELD | Admitting: Podiatry

## 2015-01-28 ENCOUNTER — Encounter: Payer: Self-pay | Admitting: Podiatry

## 2015-01-28 VITALS — BP 165/82 | HR 75 | Resp 18

## 2015-01-28 DIAGNOSIS — L97511 Non-pressure chronic ulcer of other part of right foot limited to breakdown of skin: Secondary | ICD-10-CM

## 2015-01-28 DIAGNOSIS — L84 Corns and callosities: Secondary | ICD-10-CM

## 2015-01-28 DIAGNOSIS — L89891 Pressure ulcer of other site, stage 1: Secondary | ICD-10-CM

## 2015-01-28 DIAGNOSIS — E1169 Type 2 diabetes mellitus with other specified complication: Secondary | ICD-10-CM

## 2015-01-28 NOTE — Patient Instructions (Signed)
Continue daily dressing changes. Monitor for any signs/symptoms of infection. Call the office immediately if any occur or go directly to the emergency room. Call with any questions/concerns.  

## 2015-01-29 ENCOUNTER — Encounter: Payer: Self-pay | Admitting: Podiatry

## 2015-01-29 ENCOUNTER — Ambulatory Visit: Payer: BLUE CROSS/BLUE SHIELD | Admitting: Podiatry

## 2015-01-29 NOTE — Progress Notes (Signed)
Patient ID: Joshua [name], male   DOB: [birth year], 59 y.o.   MRN: [id number]  Subjective : 59 year old male returns the office they for follow up evaluation of left hallux ulceration. He states that since last appointment he has been continuing to clean the wound daily with antibacterial soap and applying Silvadene to the wound daily. He is been taking antibiotics as directed which is prescribed by his primary care physician. He states that overall his toe is doing better. He states that it no longer has an odor. He denies any redness to the toe or any red streaking. He denies any drainage or purulence. Denies any systemic complaints as fevers, chills, nausea, vomiting. No other complaints this time no acute changes since last appointment.   Objective : AAO 3, NAD  DP/PT pulses palpable, CRT less than 3 seconds  Protective sensation decreased with Simms Weinstein monofilament, decreased vibratory sensation.  Plantar aspect of the right hallux with a hyperkeratotic lesion in the central ulceration. After debridement of the keratotic periwound the wound measures 0.9 x 0.6 cm which is superficial. There is no probing, undermining, tunneling. There is no areas of fluctuance or crepitus. There is no surrounding erythema or ascending cellulitis. There is no drainage or purulence expressed. No malodor. Hyperkeratotic lesion right lateral fifth digit. Upon debridement underlying ulceration or clinical signs of infection.  Previous right second and third digit amputations.  No other open lesions are identified.  No pain with calf compression, swelling, warmth, erythema.   Assessment : 59 year old male follow-up evaluation left hallux ulceration, which appears improved.   Plan : -Treatment options discussed including alternatives, risks, complications.  -Wound sharply debrided without complications to a bleeding, granular, red wound base. Silvadene was applied followed by dry sterile dressing. Recommend  continue with daily dressing changes as discussed. Continue with surgical shoe and offloading pads.  -Hyperkeratotic lesion right fifth digit sharply debrided without complication/bleeding.  -Monitoring signs or symptoms of infection and directed to call the office immediately if any are to occur or go directly to the emergency room. Continue course of antibiotics.  -Follow-up in 2 weeks or sooner if any palms are to arise. In the meantime, encouraged to call the office with any questions, concerns, change in symptoms.

## 2015-02-14 ENCOUNTER — Ambulatory Visit (INDEPENDENT_AMBULATORY_CARE_PROVIDER_SITE_OTHER): Payer: BLUE CROSS/BLUE SHIELD | Admitting: Podiatry

## 2015-02-14 ENCOUNTER — Ambulatory Visit (INDEPENDENT_AMBULATORY_CARE_PROVIDER_SITE_OTHER): Payer: BLUE CROSS/BLUE SHIELD

## 2015-02-14 ENCOUNTER — Encounter: Payer: Self-pay | Admitting: Podiatry

## 2015-02-14 VITALS — BP 171/92 | HR 64

## 2015-02-14 DIAGNOSIS — M79671 Pain in right foot: Secondary | ICD-10-CM

## 2015-02-14 DIAGNOSIS — R0989 Other specified symptoms and signs involving the circulatory and respiratory systems: Secondary | ICD-10-CM | POA: Diagnosis not present

## 2015-02-14 DIAGNOSIS — L97511 Non-pressure chronic ulcer of other part of right foot limited to breakdown of skin: Secondary | ICD-10-CM | POA: Diagnosis not present

## 2015-02-14 NOTE — Patient Instructions (Signed)
Continue daily dressing changes as discussed Monitor for any signs/symptoms of infection. Call the office immediately if any occur or go directly to the emergency room. Call with any questions/concerns.

## 2015-02-16 ENCOUNTER — Encounter: Payer: Self-pay | Admitting: Podiatry

## 2015-02-16 NOTE — Progress Notes (Signed)
Patient ID: Joshua [name], male   DOB: [birth year], 59 y.o.   MRN: [id number]  Subjective : Mr. Busic returns the office they for follow up evaluation of left hallux ulceration. He states that he does have some mild discomfort overlying the area just below the site of the ulceration. He has been continuing with washing the wound daily with antibacterial soap and applying Silvadene and a dressing. He denies any surrounding redness or any red streaking. Denies any drainage or purulence from the wound. Denies any systemic complaints as fevers, chills, nausea, vomiting. No other complaints at this time.  Objective : AAO 3, NAD  DP/PT pulses palpable although somewhat decreased, CRT less than 3 seconds  Protective sensation decreased with Simms Weinstein monofilament, decreased vibratory sensation.  Plantar aspect of the right hallux with a hyperkeratotic lesion in the central ulceration. After debridement of the hyperkeratotic tissue the wound measures 0.8 x 0.5 cm and is superficial. The peri-wound is macerated. There is no probing, undermining, tunneling. There is no areas of fluctuance or crepitus. There is no surrounding erythema or ascending cellulitis. There is no drainage or purulence expressed. Hyperkeratotic lesion right lateral fifth digit. Upon debridement underlying ulceration or clinical signs of infection.  Just inferior to the wound there is a transverse area within the sulcus of the hallux where it appears that the bandage was cutting into the skin and there is a small superficial wound in the area. There is no drainage or clinical signs of infection around this area. This is the site of tenderness subjectively. Previous right second and third digit amputations.  No other open lesions are identified.  No pain with calf compression, swelling, warmth, erythema.   Assessment : 59 year old male follow-up evaluation left hallux ulceration  Plan : -Treatment options discussed including  alternatives, risks, complications.  -Wound sharply debrided without complications to a bleeding, granular, red wound base. Small amount of silvadene was applied followed by dry sterile dressing. Recommend continue with daily dressing changes as discussed. Continue with surgical shoe and offloading pads.  -Ordered vascular studies as the wound is slow healing although is most likely due to pressure -At next appointment discussed the patient that if there is not significant healing would likely order MRI and/or refer to the wound care center.  -Monitor for any signs or symptoms of infection and directed to call the office immediately if any are to occur or go directly to the emergency room. Continue course of antibiotics.  -Follow-up in 2 weeks or sooner if any problems are to arise. In the meantime, encouraged to call the office with any questions, concerns, change in symptoms.

## 2015-02-17 ENCOUNTER — Telehealth (HOSPITAL_COMMUNITY): Payer: Self-pay | Admitting: *Deleted

## 2015-02-28 ENCOUNTER — Encounter: Payer: Self-pay | Admitting: Podiatry

## 2015-02-28 ENCOUNTER — Ambulatory Visit (INDEPENDENT_AMBULATORY_CARE_PROVIDER_SITE_OTHER): Payer: BLUE CROSS/BLUE SHIELD | Admitting: Podiatry

## 2015-02-28 ENCOUNTER — Ambulatory Visit: Payer: Self-pay

## 2015-02-28 DIAGNOSIS — M79674 Pain in right toe(s): Secondary | ICD-10-CM

## 2015-02-28 DIAGNOSIS — L97511 Non-pressure chronic ulcer of other part of right foot limited to breakdown of skin: Secondary | ICD-10-CM

## 2015-02-28 DIAGNOSIS — L89891 Pressure ulcer of other site, stage 1: Secondary | ICD-10-CM | POA: Diagnosis not present

## 2015-02-28 DIAGNOSIS — E1169 Type 2 diabetes mellitus with other specified complication: Secondary | ICD-10-CM | POA: Diagnosis not present

## 2015-03-03 NOTE — Progress Notes (Signed)
Patient ID: Joshua [name], male   DOB: June 26, 1956, 59 y.o.   MRN: [id number]  Subjective : Mr. Belvedere returns the office they for follow up evaluation of right hallux ulceration. He states that he does continue have some discomfort overlying the site of ulceration. He has been continuing with washing the wound daily with antibacterial soap and applying Silvadene and a dressing. He denies any surrounding redness or any red streaking. Denies any drainage or purulence from the wound. Denies any systemic complaints as fevers, chills, nausea, vomiting. He does state that over the weekend he did develop some malodor however the malodor did resolve. No other complaints at this time.  Objective: AAO 3, NAD  DP/PT pulses palpable, CRT less than 3 seconds  Protective sensation decreased with Simms Weinstein monofilament, decreased vibratory sensation.  Plantar aspect of the right hallux with a hyperkeratotic lesion in the central ulceration. After debridement of the hyperkeratotic tissue the wound measures 0.8 x 0.5 cm and is superficial with a granular wound base. The peri-wound is somewhat macerated. There is no probing, undermining, tunneling. There are no areas of fluctuance or crepitus. There is no surrounding erythema or ascending cellulitis. There is no drainage or purulence expressed. No malodor at this time. No other open lesions or pre-ulcerative lesions identified.  Previous right second and third digit amputations.  No pain with calf compression, swelling, warmth, erythema.   Assessment : 59 year old male follow-up evaluation right hallux ulceration  Plan : -X-ray was obtained and reviewed the patient. -Treatment options discussed including alternatives, risks, complications.  -Will obtain MRI to rule out underlying osteomyelitis -Previously ordered vascular studies. He needs to call them back to schedule this. Recommended him to do this.  -Wound sharply debrided without complications to a  bleeding, granular, red wound base. Small amount of silvadene was applied followed by dry sterile dressing. Recommend continue with daily dressing changes as discussed. Continue with surgical shoe and offloading pads.  -Discussed wound care referral however we'll wait the results of the MRI before proceeding.  -Monitor for any signs or symptoms of infection and directed to call the office immediately if any are to occur or go directly to the emergency room.  -Follow-up in 1 week or sooner if any problems are to arise. In the meantime, encouraged to call the office with any questions, concerns, change in symptoms.

## 2015-03-04 ENCOUNTER — Telehealth: Payer: Self-pay | Admitting: *Deleted

## 2015-03-05 ENCOUNTER — Ambulatory Visit (INDEPENDENT_AMBULATORY_CARE_PROVIDER_SITE_OTHER): Payer: BLUE CROSS/BLUE SHIELD | Admitting: Podiatry

## 2015-03-05 ENCOUNTER — Encounter: Payer: Self-pay | Admitting: Podiatry

## 2015-03-05 VITALS — BP 174/105 | HR 83

## 2015-03-05 DIAGNOSIS — L89891 Pressure ulcer of other site, stage 1: Secondary | ICD-10-CM | POA: Diagnosis not present

## 2015-03-05 DIAGNOSIS — L97511 Non-pressure chronic ulcer of other part of right foot limited to breakdown of skin: Secondary | ICD-10-CM

## 2015-03-05 NOTE — Telephone Encounter (Signed)
"  I still haven't heard anything in regards to the MRI and someone was supposed to call me about the cost.  I have an appointment scheduled for tomorrow.  Do I need to still come in because he was going to base how he would proceed upon the MRI?"  I will send your order over to [facility].  You will have to contact them in regards to the cost.  I think you should keep the appointment.  "Why do I need to keep the appointment?  He still doesn't know what's going on because I haven't had the MRI."  He suspects there may be Osteomyelitis so you still need to have proper wound care.  "I'm going to be honest, I can't afford to make unnecessary trips.  Every time I come there I have to pay a specialist co-pay.  That gets expensive."  I understand, the decision is up to you but my recommendation is that you keep your appointment.

## 2015-03-06 ENCOUNTER — Telehealth (HOSPITAL_COMMUNITY): Payer: Self-pay | Admitting: *Deleted

## 2015-03-09 ENCOUNTER — Encounter: Payer: Self-pay | Admitting: Podiatry

## 2015-03-09 NOTE — Progress Notes (Signed)
Patient ID: Joshua [name], male   DOB: [birth year], 59 y.o.   MRN: [id number]  Subjective : Joshua [name] returns the office they for follow up evaluation of right hallux ulceration. He states that he does continue have some discomfort overlying the site of ulceration although is has improved some. He has been continuing with washing the wound daily with antibacterial soap and applying Silvadene and a dressing. He denies any surrounding redness or any red streaking. Denies any drainage or purulence from the wound. Denies any systemic complaints as fevers, chills, nausea, vomiting. He does state that over the weekend he did develop some malodor however the malodor did resolve. No other complaints at this time.  Objective: AAO 3, NAD  DP/PT pulses palpable, CRT less than 3 seconds  Protective sensation decreased with Simms Weinstein monofilament, decreased vibratory sensation.  Plantar aspect of the right hallux with a hyperkeratotic lesion in the central ulceration. After debridement of the hyperkeratotic tissue the wound measures 0.7 x 0.5 cm and is superficial with a granular wound base. There is no probing, undermining, tunneling. There are no areas of fluctuance or crepitus. There is no surrounding erythema or ascending cellulitis. There is no drainage or purulence expressed. No malodor.  No other open lesions or pre-ulcerative lesions identified.  Previous right second and third digit amputations which are well healed.   No pain with calf compression, swelling, warmth, erythema.   Assessment : 59 year old male follow-up evaluation right hallux ulceration  Plan : -X-ray was obtained and reviewed the patient. -Treatment options discussed including alternatives, risks, complications.  -Will obtain MRI to rule out underlying osteomyelitis -Previously ordered vascular studies. He needs to call them back to schedule this. Recommended him to do this.  -Wound sharply debrided without complications  to a bleeding, granular, red wound base. Small amount of silvadene was applied followed by dry sterile dressing. Recommend continue with daily dressing changes as discussed. Continue with surgical shoe and offloading pads.  -I long discussion with the patient in regards to possible and limitation of the digit as she was concerned about losing the toe. I discussed with him once we get the results of the MRI we can discuss this further. Also as the wound has not been progressing much a wound care referral was placed.  -Follow-up in 2 weeks or sooner if any problems are to arise. In the meantime, encouraged to call the office with any questions, concerns, change in symptoms.

## 2015-03-17 ENCOUNTER — Other Ambulatory Visit: Payer: Self-pay

## 2015-03-26 ENCOUNTER — Telehealth: Payer: Self-pay | Admitting: *Deleted

## 2015-03-26 NOTE — Telephone Encounter (Signed)
Patient's MRI with and without contrast needs authorization through [employer].  He has [employer] [employer] [place].  You can call them at [phone].  The CPT code is 73720.  His appointment is scheduled for tomorrow at 12:15pm.  I called and spoke to [facility] at [place].  I answered all clinical questions.  He stated MRI was authorized from 03/26/2015 to 05/24/2015.  Authorization number is [id number].  I called and informed Anderson Malta that MRI was authorized.

## 2015-03-27 ENCOUNTER — Ambulatory Visit
Admission: RE | Admit: 2015-03-27 | Discharge: 2015-03-27 | Disposition: A | Discharge status: Home / Self Care | Payer: BLUE CROSS/BLUE SHIELD | Source: Ambulatory Visit | Attending: Podiatry | Admitting: Podiatry

## 2015-03-27 DIAGNOSIS — M79674 Pain in right toe(s): Secondary | ICD-10-CM

## 2015-03-27 MED ORDER — GADOBENATE DIMEGLUMINE 529 MG/ML IV SOLN: 20.0000 mL | Freq: Once | INTRAVENOUS | Status: AC | PRN

## 2015-03-27 MED ORDER — GADOBENATE DIMEGLUMINE 529 MG/ML IV SOLN
20.0000 mL | Freq: Once | INTRAVENOUS | Status: AC | PRN
  Administered 2015-03-27: 20 mL via INTRAVENOUS

## 2015-03-28 ENCOUNTER — Encounter: Payer: Self-pay | Admitting: Podiatry

## 2015-03-28 ENCOUNTER — Ambulatory Visit (INDEPENDENT_AMBULATORY_CARE_PROVIDER_SITE_OTHER): Payer: BLUE CROSS/BLUE SHIELD | Admitting: Podiatry

## 2015-03-28 VITALS — BP 115/76 | HR 78 | Resp 18

## 2015-03-28 DIAGNOSIS — L89891 Pressure ulcer of other site, stage 1: Secondary | ICD-10-CM

## 2015-03-28 DIAGNOSIS — L97511 Non-pressure chronic ulcer of other part of right foot limited to breakdown of skin: Secondary | ICD-10-CM

## 2015-03-28 MED ORDER — CIPROFLOXACIN HCL 500 MG PO TABS: 500.0000 mg | ORAL_TABLET | Freq: Two times a day (BID) | ORAL | Status: DC

## 2015-03-28 MED ORDER — CLINDAMYCIN HCL 300 MG PO CAPS: 300.0000 mg | ORAL_CAPSULE | Freq: Three times a day (TID) | ORAL | Status: DC

## 2015-03-28 NOTE — Patient Instructions (Signed)
Follow up with the wound care center Start antibiotics Monitor for any signs/symptoms of infection. Call the office immediately if any occur or go directly to the emergency room. Call with any questions/concerns.

## 2015-03-31 ENCOUNTER — Encounter: Payer: Self-pay | Admitting: Podiatry

## 2015-03-31 NOTE — Progress Notes (Signed)
Patient ID: Joshua [name], male   DOB: Feb 24, 1956, 59 y.o.   MRN: [id number]  Subjective : Joshua [name] returns the office they for follow up evaluation of right hallux ulceration. He states that he does continue have some intermittent discomfort overlying the site of ulceration. He has been continuing with washing the wound daily with antibacterial soap and applying Silvadene and a dressing. He denies any surrounding redness or any red streaking. Denies any drainage or purulence from the wound. Denies any systemic complaints as fevers, chills, nausea, vomiting. He does state that over the weekend he did develop some malodor however the malodor did resolve. No other complaints at this time.  Objective: AAO 3, NAD  DP/PT pulses palpable, CRT less than 3 seconds  Protective sensation decreased with Simms Weinstein monofilament, decreased vibratory sensation.  Plantar aspect of the right hallux with a hyperkeratotic lesion in the central ulceration. After debridement of the hyperkeratotic tissue the wound measures 0.5 x 0.4 cm and is superficial with a granular wound base. There is no probing, undermining, tunneling. There are no areas of fluctuance or crepitus. There is no surrounding erythema or ascending cellulitis. There is no drainage or purulence expressed. No malodor. There is mild edema to the foot without any associated erythema or increase in warmth.  Thick hyperkeratotic tissue right dorsal lateral fifth digit. Upon debridement no underlying ulceration, drainage or other clinical signs of infection. No other open lesions or pre-ulcerative lesions identified.  Previous right second and third digit amputations which are well healed.   No pain with calf compression, swelling, warmth, erythema.   Assessment : 59 year old male follow-up evaluation right hallux ulceration  Plan : -Treatment options discussed including alternatives, risks, complications.  -MRI results were discussed with the  patient which revealed suspicious for osteomyelitis of the distal phalanx of the hallux. -Wound sharply debrided without complications to healthy, bleeding, granular wound base. Area was cleaned and Silvadene was applied followed by a dry sterile dressing. Continue this at home as well. -Hyperkeratotic lesion right fifth toe sharply debrided without complications. -Due to the suspicion osteomyelitis discussed surgical intervention versus conservative treatment. This time the patient's left proceed with evaluation of the [facility] [place] the cannula hyperbaric oxygen therapy. -Prescribed clindamycin and ciprofloxacin. -Follow-up with wound care center. If he is unable to follow-up within the next 2 weeks for follow up with me. -Monitor for any signs or symptoms of infection and directed to call the office immediately if any are to occur or go directly to the emergency room.

## 2015-04-28 ENCOUNTER — Encounter (HOSPITAL_BASED_OUTPATIENT_CLINIC_OR_DEPARTMENT_OTHER): Payer: BLUE CROSS/BLUE SHIELD | Attending: Plastic Surgery

## 2015-04-28 DIAGNOSIS — M869 Osteomyelitis, unspecified: Secondary | ICD-10-CM | POA: Diagnosis not present

## 2015-04-28 DIAGNOSIS — G629 Polyneuropathy, unspecified: Secondary | ICD-10-CM | POA: Diagnosis not present

## 2015-04-28 DIAGNOSIS — I213 ST elevation (STEMI) myocardial infarction of unspecified site: Secondary | ICD-10-CM | POA: Diagnosis not present

## 2015-04-28 DIAGNOSIS — L97511 Non-pressure chronic ulcer of other part of right foot limited to breakdown of skin: Secondary | ICD-10-CM | POA: Diagnosis not present

## 2015-04-28 DIAGNOSIS — E11621 Type 2 diabetes mellitus with foot ulcer: Secondary | ICD-10-CM | POA: Insufficient documentation

## 2015-04-28 DIAGNOSIS — M109 Gout, unspecified: Secondary | ICD-10-CM | POA: Insufficient documentation

## 2015-04-30 ENCOUNTER — Other Ambulatory Visit (HOSPITAL_BASED_OUTPATIENT_CLINIC_OR_DEPARTMENT_OTHER): Payer: Self-pay | Admitting: Plastic Surgery

## 2015-04-30 ENCOUNTER — Ambulatory Visit (INDEPENDENT_AMBULATORY_CARE_PROVIDER_SITE_OTHER)
Admission: RE | Admit: 2015-04-30 | Discharge: 2015-04-30 | Disposition: A | Discharge status: Home / Self Care | Payer: BLUE CROSS/BLUE SHIELD | Source: Ambulatory Visit | Attending: Vascular Surgery | Admitting: Vascular Surgery

## 2015-04-30 ENCOUNTER — Ambulatory Visit (HOSPITAL_COMMUNITY)
Admission: RE | Admit: 2015-04-30 | Discharge: 2015-04-30 | Disposition: A | Discharge status: Home / Self Care | Payer: BLUE CROSS/BLUE SHIELD | Source: Ambulatory Visit | Attending: Vascular Surgery | Admitting: Vascular Surgery

## 2015-04-30 DIAGNOSIS — L97519 Non-pressure chronic ulcer of other part of right foot with unspecified severity: Secondary | ICD-10-CM | POA: Diagnosis not present

## 2015-04-30 DIAGNOSIS — E785 Hyperlipidemia, unspecified: Secondary | ICD-10-CM | POA: Insufficient documentation

## 2015-04-30 DIAGNOSIS — E119 Type 2 diabetes mellitus without complications: Secondary | ICD-10-CM | POA: Diagnosis not present

## 2015-04-30 DIAGNOSIS — I10 Essential (primary) hypertension: Secondary | ICD-10-CM | POA: Diagnosis not present

## 2015-05-05 DIAGNOSIS — G629 Polyneuropathy, unspecified: Secondary | ICD-10-CM | POA: Diagnosis not present

## 2015-05-05 DIAGNOSIS — L97511 Non-pressure chronic ulcer of other part of right foot limited to breakdown of skin: Secondary | ICD-10-CM | POA: Diagnosis not present

## 2015-05-05 DIAGNOSIS — E11621 Type 2 diabetes mellitus with foot ulcer: Secondary | ICD-10-CM | POA: Diagnosis not present

## 2015-05-05 DIAGNOSIS — M109 Gout, unspecified: Secondary | ICD-10-CM | POA: Diagnosis not present

## 2015-05-26 ENCOUNTER — Encounter (HOSPITAL_BASED_OUTPATIENT_CLINIC_OR_DEPARTMENT_OTHER): Payer: BLUE CROSS/BLUE SHIELD | Attending: Plastic Surgery

## 2015-05-26 DIAGNOSIS — I252 Old myocardial infarction: Secondary | ICD-10-CM | POA: Diagnosis not present

## 2015-05-26 DIAGNOSIS — L97511 Non-pressure chronic ulcer of other part of right foot limited to breakdown of skin: Secondary | ICD-10-CM | POA: Diagnosis present

## 2015-05-26 DIAGNOSIS — G4730 Sleep apnea, unspecified: Secondary | ICD-10-CM | POA: Diagnosis not present

## 2015-05-26 DIAGNOSIS — E11621 Type 2 diabetes mellitus with foot ulcer: Secondary | ICD-10-CM | POA: Insufficient documentation

## 2015-05-26 DIAGNOSIS — E1140 Type 2 diabetes mellitus with diabetic neuropathy, unspecified: Secondary | ICD-10-CM | POA: Diagnosis not present

## 2015-05-26 DIAGNOSIS — M109 Gout, unspecified: Secondary | ICD-10-CM | POA: Diagnosis not present

## 2015-05-26 DIAGNOSIS — F419 Anxiety disorder, unspecified: Secondary | ICD-10-CM | POA: Diagnosis not present

## 2015-06-23 ENCOUNTER — Encounter (HOSPITAL_BASED_OUTPATIENT_CLINIC_OR_DEPARTMENT_OTHER): Payer: BLUE CROSS/BLUE SHIELD | Attending: Plastic Surgery

## 2015-06-23 DIAGNOSIS — L97511 Non-pressure chronic ulcer of other part of right foot limited to breakdown of skin: Secondary | ICD-10-CM | POA: Diagnosis not present

## 2015-06-23 DIAGNOSIS — E1121 Type 2 diabetes mellitus with diabetic nephropathy: Secondary | ICD-10-CM | POA: Diagnosis not present

## 2015-06-23 DIAGNOSIS — E11621 Type 2 diabetes mellitus with foot ulcer: Secondary | ICD-10-CM | POA: Insufficient documentation

## 2015-06-23 DIAGNOSIS — I159 Secondary hypertension, unspecified: Secondary | ICD-10-CM | POA: Insufficient documentation

## 2015-06-23 DIAGNOSIS — Z8631 Personal history of diabetic foot ulcer: Secondary | ICD-10-CM | POA: Insufficient documentation

## 2015-06-30 DIAGNOSIS — E11621 Type 2 diabetes mellitus with foot ulcer: Secondary | ICD-10-CM | POA: Diagnosis not present

## 2015-06-30 DIAGNOSIS — L97511 Non-pressure chronic ulcer of other part of right foot limited to breakdown of skin: Secondary | ICD-10-CM | POA: Diagnosis not present

## 2015-06-30 DIAGNOSIS — Z8631 Personal history of diabetic foot ulcer: Secondary | ICD-10-CM | POA: Diagnosis not present

## 2015-06-30 DIAGNOSIS — E1121 Type 2 diabetes mellitus with diabetic nephropathy: Secondary | ICD-10-CM | POA: Diagnosis not present

## 2015-07-03 ENCOUNTER — Telehealth: Payer: Self-pay | Admitting: *Deleted

## 2015-07-03 NOTE — Telephone Encounter (Signed)
PA for Azor 10-40 faxed to [facility]

## 2015-07-07 DIAGNOSIS — E11621 Type 2 diabetes mellitus with foot ulcer: Secondary | ICD-10-CM | POA: Diagnosis not present

## 2015-07-07 DIAGNOSIS — L97511 Non-pressure chronic ulcer of other part of right foot limited to breakdown of skin: Secondary | ICD-10-CM | POA: Diagnosis not present

## 2015-07-07 DIAGNOSIS — Z8631 Personal history of diabetic foot ulcer: Secondary | ICD-10-CM | POA: Diagnosis not present

## 2015-07-07 DIAGNOSIS — E1121 Type 2 diabetes mellitus with diabetic nephropathy: Secondary | ICD-10-CM | POA: Diagnosis not present

## 2015-07-18 ENCOUNTER — Other Ambulatory Visit: Payer: Self-pay | Admitting: Cardiology

## 2015-07-18 NOTE — Telephone Encounter (Signed)
Rx(s) sent to pharmacy electronically.  

## 2015-07-21 ENCOUNTER — Encounter (HOSPITAL_BASED_OUTPATIENT_CLINIC_OR_DEPARTMENT_OTHER): Payer: BLUE CROSS/BLUE SHIELD | Attending: Plastic Surgery

## 2015-07-21 DIAGNOSIS — M109 Gout, unspecified: Secondary | ICD-10-CM | POA: Insufficient documentation

## 2015-07-21 DIAGNOSIS — E1121 Type 2 diabetes mellitus with diabetic nephropathy: Secondary | ICD-10-CM | POA: Diagnosis not present

## 2015-07-21 DIAGNOSIS — L97511 Non-pressure chronic ulcer of other part of right foot limited to breakdown of skin: Secondary | ICD-10-CM | POA: Insufficient documentation

## 2015-07-21 DIAGNOSIS — I252 Old myocardial infarction: Secondary | ICD-10-CM | POA: Diagnosis not present

## 2015-07-21 DIAGNOSIS — M869 Osteomyelitis, unspecified: Secondary | ICD-10-CM | POA: Diagnosis not present

## 2015-07-21 DIAGNOSIS — E11621 Type 2 diabetes mellitus with foot ulcer: Secondary | ICD-10-CM | POA: Diagnosis not present

## 2015-07-21 DIAGNOSIS — G4733 Obstructive sleep apnea (adult) (pediatric): Secondary | ICD-10-CM | POA: Insufficient documentation

## 2015-07-29 ENCOUNTER — Encounter (HOSPITAL_COMMUNITY): Payer: Self-pay | Admitting: Emergency Medicine

## 2015-07-29 ENCOUNTER — Encounter: Payer: Self-pay | Admitting: Cardiovascular Disease

## 2015-07-29 ENCOUNTER — Emergency Department (HOSPITAL_COMMUNITY): Payer: BLUE CROSS/BLUE SHIELD

## 2015-07-29 ENCOUNTER — Inpatient Hospital Stay (HOSPITAL_COMMUNITY)
Admission: EM | Admit: 2015-07-29 | Discharge: 2015-07-31 | DRG: 286 | Disposition: A | Discharge status: Home / Self Care | Payer: BLUE CROSS/BLUE SHIELD | Attending: Cardiovascular Disease | Admitting: Cardiovascular Disease

## 2015-07-29 DIAGNOSIS — Z6841 Body Mass Index (BMI) 40.0 and over, adult: Secondary | ICD-10-CM

## 2015-07-29 DIAGNOSIS — I25110 Atherosclerotic heart disease of native coronary artery with unstable angina pectoris: Principal | ICD-10-CM | POA: Diagnosis present

## 2015-07-29 DIAGNOSIS — Z89421 Acquired absence of other right toe(s): Secondary | ICD-10-CM

## 2015-07-29 DIAGNOSIS — M109 Gout, unspecified: Secondary | ICD-10-CM | POA: Diagnosis present

## 2015-07-29 DIAGNOSIS — I252 Old myocardial infarction: Secondary | ICD-10-CM

## 2015-07-29 DIAGNOSIS — T82897A Other specified complication of cardiac prosthetic devices, implants and grafts, initial encounter: Secondary | ICD-10-CM | POA: Diagnosis present

## 2015-07-29 DIAGNOSIS — Z9841 Cataract extraction status, right eye: Secondary | ICD-10-CM

## 2015-07-29 DIAGNOSIS — M25512 Pain in left shoulder: Secondary | ICD-10-CM | POA: Diagnosis present

## 2015-07-29 DIAGNOSIS — T82858A Stenosis of vascular prosthetic devices, implants and grafts, initial encounter: Secondary | ICD-10-CM | POA: Diagnosis present

## 2015-07-29 DIAGNOSIS — E1122 Type 2 diabetes mellitus with diabetic chronic kidney disease: Secondary | ICD-10-CM | POA: Diagnosis present

## 2015-07-29 DIAGNOSIS — K219 Gastro-esophageal reflux disease without esophagitis: Secondary | ICD-10-CM | POA: Diagnosis present

## 2015-07-29 DIAGNOSIS — I209 Angina pectoris, unspecified: Secondary | ICD-10-CM

## 2015-07-29 DIAGNOSIS — E785 Hyperlipidemia, unspecified: Secondary | ICD-10-CM | POA: Diagnosis present

## 2015-07-29 DIAGNOSIS — I5031 Acute diastolic (congestive) heart failure: Secondary | ICD-10-CM | POA: Diagnosis present

## 2015-07-29 DIAGNOSIS — E1165 Type 2 diabetes mellitus with hyperglycemia: Secondary | ICD-10-CM | POA: Diagnosis present

## 2015-07-29 DIAGNOSIS — E66813 Obesity, class 3: Secondary | ICD-10-CM | POA: Diagnosis present

## 2015-07-29 DIAGNOSIS — M542 Cervicalgia: Secondary | ICD-10-CM | POA: Diagnosis present

## 2015-07-29 DIAGNOSIS — I200 Unstable angina: Secondary | ICD-10-CM

## 2015-07-29 DIAGNOSIS — E1142 Type 2 diabetes mellitus with diabetic polyneuropathy: Secondary | ICD-10-CM | POA: Diagnosis present

## 2015-07-29 DIAGNOSIS — Z7982 Long term (current) use of aspirin: Secondary | ICD-10-CM | POA: Diagnosis not present

## 2015-07-29 DIAGNOSIS — Z794 Long term (current) use of insulin: Secondary | ICD-10-CM

## 2015-07-29 DIAGNOSIS — Z955 Presence of coronary angioplasty implant and graft: Secondary | ICD-10-CM

## 2015-07-29 DIAGNOSIS — I509 Heart failure, unspecified: Secondary | ICD-10-CM | POA: Diagnosis not present

## 2015-07-29 DIAGNOSIS — G4733 Obstructive sleep apnea (adult) (pediatric): Secondary | ICD-10-CM | POA: Diagnosis present

## 2015-07-29 DIAGNOSIS — I10 Essential (primary) hypertension: Secondary | ICD-10-CM | POA: Diagnosis not present

## 2015-07-29 DIAGNOSIS — E1143 Type 2 diabetes mellitus with diabetic autonomic (poly)neuropathy: Secondary | ICD-10-CM | POA: Diagnosis present

## 2015-07-29 DIAGNOSIS — Z961 Presence of intraocular lens: Secondary | ICD-10-CM | POA: Diagnosis present

## 2015-07-29 DIAGNOSIS — I2510 Atherosclerotic heart disease of native coronary artery without angina pectoris: Secondary | ICD-10-CM | POA: Diagnosis not present

## 2015-07-29 DIAGNOSIS — Z9861 Coronary angioplasty status: Secondary | ICD-10-CM

## 2015-07-29 DIAGNOSIS — N183 Chronic kidney disease, stage 3 (moderate): Secondary | ICD-10-CM | POA: Diagnosis present

## 2015-07-29 DIAGNOSIS — R61 Generalized hyperhidrosis: Secondary | ICD-10-CM | POA: Diagnosis present

## 2015-07-29 DIAGNOSIS — Z9842 Cataract extraction status, left eye: Secondary | ICD-10-CM | POA: Diagnosis not present

## 2015-07-29 DIAGNOSIS — I129 Hypertensive chronic kidney disease with stage 1 through stage 4 chronic kidney disease, or unspecified chronic kidney disease: Secondary | ICD-10-CM | POA: Diagnosis present

## 2015-07-29 DIAGNOSIS — Z79899 Other long term (current) drug therapy: Secondary | ICD-10-CM

## 2015-07-29 DIAGNOSIS — G990 Autonomic neuropathy in diseases classified elsewhere: Secondary | ICD-10-CM

## 2015-07-29 DIAGNOSIS — G4730 Sleep apnea, unspecified: Secondary | ICD-10-CM | POA: Diagnosis present

## 2015-07-29 DIAGNOSIS — R079 Chest pain, unspecified: Secondary | ICD-10-CM

## 2015-07-29 LAB — CBC WITH DIFFERENTIAL/PLATELET
Basophils Absolute: 0 10*3/uL (ref 0.0–0.1)
Basophils Relative: 0 % (ref 0–1)
Eosinophils Absolute: 0.1 10*3/uL (ref 0.0–0.7)
Eosinophils Relative: 1 % (ref 0–5)
HCT: 38.8 % — ABNORMAL LOW (ref 39.0–52.0)
Hemoglobin: 12.9 g/dL — ABNORMAL LOW (ref 13.0–17.0)
Lymphocytes Relative: 16 % (ref 12–46)
Lymphs Abs: 1.4 10*3/uL (ref 0.7–4.0)
MCH: 28.5 pg (ref 26.0–34.0)
MCHC: 33.2 g/dL (ref 30.0–36.0)
MCV: 85.8 fL (ref 78.0–100.0)
Monocytes Absolute: 0.5 10*3/uL (ref 0.1–1.0)
Monocytes Relative: 6 % (ref 3–12)
Neutro Abs: 6.9 10*3/uL (ref 1.7–7.7)
Neutrophils Relative %: 77 % (ref 43–77)
Platelets: 185 10*3/uL (ref 150–400)
RBC: 4.52 MIL/uL (ref 4.22–5.81)
RDW: 14.1 % (ref 11.5–15.5)
WBC: 8.9 10*3/uL (ref 4.0–10.5)

## 2015-07-29 LAB — GLUCOSE, CAPILLARY
Glucose-Capillary: 76 mg/dL (ref 65–99)
Glucose-Capillary: 159 mg/dL — ABNORMAL HIGH (ref 65–99)
Glucose-Capillary: 164 mg/dL — ABNORMAL HIGH (ref 65–99)
Glucose-Capillary: 238 mg/dL — ABNORMAL HIGH (ref 65–99)

## 2015-07-29 LAB — I-STAT CHEM 8, ED
BUN: 21 mg/dL — ABNORMAL HIGH (ref 6–20)
Calcium, Ion: 1.24 mmol/L — ABNORMAL HIGH (ref 1.12–1.23)
Chloride: 107 mmol/L (ref 101–111)
Creatinine, Ser: 1.5 mg/dL — ABNORMAL HIGH (ref 0.61–1.24)
Glucose, Bld: 125 mg/dL — ABNORMAL HIGH (ref 65–99)
HCT: 37 % — ABNORMAL LOW (ref 39.0–52.0)
Hemoglobin: 12.6 g/dL — ABNORMAL LOW (ref 13.0–17.0)
Potassium: 3.9 mmol/L (ref 3.5–5.1)
Sodium: 142 mmol/L (ref 135–145)
TCO2: 21 mmol/L (ref 0–100)

## 2015-07-29 LAB — I-STAT TROPONIN, ED: Troponin i, poc: 0.01 ng/mL (ref 0.00–0.08)

## 2015-07-29 LAB — BASIC METABOLIC PANEL
Anion gap: 8 (ref 5–15)
BUN: 18 mg/dL (ref 6–20)
CO2: 23 mmol/L (ref 22–32)
Calcium: 9.1 mg/dL (ref 8.9–10.3)
Chloride: 107 mmol/L (ref 101–111)
Creatinine, Ser: 1.62 mg/dL — ABNORMAL HIGH (ref 0.61–1.24)
GFR calc Af Amer: 53 mL/min — ABNORMAL LOW (ref >60)
GFR calc non Af Amer: 46 mL/min — ABNORMAL LOW (ref >60)
Glucose, Bld: 125 mg/dL — ABNORMAL HIGH (ref 65–99)
Potassium: 3.9 mmol/L (ref 3.5–5.1)
Sodium: 138 mmol/L (ref 135–145)

## 2015-07-29 LAB — BRAIN NATRIURETIC PEPTIDE: B Natriuretic Peptide: 148.7 pg/mL — ABNORMAL HIGH (ref 0.0–100.0)

## 2015-07-29 LAB — HEPARIN LEVEL (UNFRACTIONATED)
Heparin Unfractionated: 0.32 IU/mL (ref 0.30–0.70)
Heparin Unfractionated: 0.24 IU/mL — ABNORMAL LOW (ref 0.30–0.70)

## 2015-07-29 LAB — PROTIME-INR
INR: 1.04 (ref 0.00–1.49)
Prothrombin Time: 13.8 seconds (ref 11.6–15.2)

## 2015-07-29 LAB — CBG MONITORING, ED: Glucose-Capillary: 77 mg/dL (ref 65–99)

## 2015-07-29 MED ORDER — DOXAZOSIN MESYLATE 8 MG PO TABS
8.0000 mg | ORAL_TABLET | Freq: Every day | ORAL | Status: DC
  Administered 2015-07-29 – 2015-07-30 (×2): 8 mg via ORAL
  Filled 2015-07-29 (×4): qty 1

## 2015-07-29 MED ORDER — AMLODIPINE BESYLATE 10 MG PO TABS
10.0000 mg | ORAL_TABLET | Freq: Every day | ORAL | Status: DC
  Administered 2015-07-29 – 2015-07-31 (×3): 10 mg via ORAL
  Filled 2015-07-29 (×3): qty 1

## 2015-07-29 MED ORDER — ASPIRIN EC 81 MG PO TBEC
81.0000 mg | DELAYED_RELEASE_TABLET | Freq: Every day | ORAL | Status: DC
  Administered 2015-07-29: 81 mg via ORAL
  Filled 2015-07-29: qty 1

## 2015-07-29 MED ORDER — ACETAMINOPHEN 325 MG PO TABS: 650.0000 mg | ORAL_TABLET | ORAL | Status: DC | PRN

## 2015-07-29 MED ORDER — KETOROLAC TROMETHAMINE 30 MG/ML IJ SOLN
30.0000 mg | Freq: Once | INTRAMUSCULAR | Status: AC
  Administered 2015-07-29: 30 mg via INTRAVENOUS
  Filled 2015-07-29: qty 1

## 2015-07-29 MED ORDER — METOPROLOL SUCCINATE ER 100 MG PO TB24
100.0000 mg | ORAL_TABLET | Freq: Every day | ORAL | Status: DC
  Administered 2015-07-29 – 2015-07-31 (×3): 100 mg via ORAL
  Filled 2015-07-29 (×3): qty 1

## 2015-07-29 MED ORDER — TICAGRELOR 90 MG PO TABS
90.0000 mg | ORAL_TABLET | Freq: Two times a day (BID) | ORAL | Status: DC
  Administered 2015-07-29 – 2015-07-31 (×5): 90 mg via ORAL
  Filled 2015-07-29 (×5): qty 1

## 2015-07-29 MED ORDER — SPIRONOLACTONE 25 MG PO TABS
12.5000 mg | ORAL_TABLET | Freq: Every day | ORAL | Status: DC
  Administered 2015-07-29 – 2015-07-31 (×3): 12.5 mg via ORAL
  Filled 2015-07-29 (×3): qty 1

## 2015-07-29 MED ORDER — ROSUVASTATIN CALCIUM 10 MG PO TABS
40.0000 mg | ORAL_TABLET | Freq: Every day | ORAL | Status: DC
  Administered 2015-07-29 – 2015-07-31 (×3): 40 mg via ORAL
  Filled 2015-07-29 (×3): qty 4

## 2015-07-29 MED ORDER — ISOSORBIDE MONONITRATE ER 30 MG PO TB24
30.0000 mg | ORAL_TABLET | Freq: Every day | ORAL | Status: DC
  Administered 2015-07-29 – 2015-07-31 (×3): 30 mg via ORAL
  Filled 2015-07-29 (×3): qty 1

## 2015-07-29 MED ORDER — ALLOPURINOL 300 MG PO TABS
300.0000 mg | ORAL_TABLET | Freq: Every day | ORAL | Status: DC
  Administered 2015-07-29 – 2015-07-31 (×3): 300 mg via ORAL
  Filled 2015-07-29 (×3): qty 1

## 2015-07-29 MED ORDER — INSULIN ASPART 100 UNIT/ML ~~LOC~~ SOLN
0.0000 [IU] | Freq: Every day | SUBCUTANEOUS | Status: DC
  Administered 2015-07-29: 2 [IU] via SUBCUTANEOUS
  Administered 2015-07-30: 4 [IU] via SUBCUTANEOUS

## 2015-07-29 MED ORDER — HEPARIN (PORCINE) IN NACL 100-0.45 UNIT/ML-% IJ SOLN
1700.0000 [IU]/h | INTRAMUSCULAR | Status: DC
  Administered 2015-07-29: 1450 [IU]/h via INTRAVENOUS
  Administered 2015-07-29: 1700 [IU]/h via INTRAVENOUS
  Filled 2015-07-29 (×2): qty 250

## 2015-07-29 MED ORDER — AMLODIPINE-OLMESARTAN 10-40 MG PO TABS: 1.0000 | ORAL_TABLET | Freq: Every day | ORAL | Status: DC

## 2015-07-29 MED ORDER — SODIUM CHLORIDE 0.9 % IV SOLN
INTRAVENOUS | Status: DC
  Administered 2015-07-29 (×2): via INTRAVENOUS

## 2015-07-29 MED ORDER — SODIUM CHLORIDE 0.9 % IV SOLN: 250.0000 mL | INTRAVENOUS | Status: DC | PRN

## 2015-07-29 MED ORDER — SODIUM CHLORIDE 0.9 % WEIGHT BASED INFUSION
3.0000 mL/kg/h | INTRAVENOUS | Status: DC
  Administered 2015-07-29: 3 mL/kg/h via INTRAVENOUS

## 2015-07-29 MED ORDER — SODIUM CHLORIDE 0.9 % WEIGHT BASED INFUSION: 1.0000 mL/kg/h | INTRAVENOUS | Status: DC

## 2015-07-29 MED ORDER — FUROSEMIDE 10 MG/ML IJ SOLN
20.0000 mg | Freq: Once | INTRAMUSCULAR | Status: AC
  Administered 2015-07-29: 20 mg via INTRAVENOUS
  Filled 2015-07-29: qty 2

## 2015-07-29 MED ORDER — SODIUM CHLORIDE 0.9 % IJ SOLN: 3.0000 mL | INTRAMUSCULAR | Status: DC | PRN

## 2015-07-29 MED ORDER — NITROGLYCERIN 0.4 MG SL SUBL
0.4000 mg | SUBLINGUAL_TABLET | SUBLINGUAL | Status: DC | PRN
Start: 2015-07-29 — End: 2015-07-31

## 2015-07-29 MED ORDER — SODIUM CHLORIDE 0.9 % IJ SOLN
3.0000 mL | Freq: Two times a day (BID) | INTRAMUSCULAR | Status: DC
  Administered 2015-07-29: 3 mL via INTRAVENOUS

## 2015-07-29 MED ORDER — ONDANSETRON HCL 4 MG/2ML IJ SOLN: 4.0000 mg | Freq: Four times a day (QID) | INTRAMUSCULAR | Status: DC | PRN

## 2015-07-29 MED ORDER — IRBESARTAN 150 MG PO TABS: 300.0000 mg | ORAL_TABLET | Freq: Every day | ORAL | Status: DC

## 2015-07-29 MED ORDER — HEPARIN BOLUS VIA INFUSION
4000.0000 [IU] | Freq: Once | INTRAVENOUS | Status: AC
  Administered 2015-07-29: 4000 [IU] via INTRAVENOUS
  Filled 2015-07-29: qty 4000

## 2015-07-29 MED ORDER — INSULIN ASPART 100 UNIT/ML ~~LOC~~ SOLN
0.0000 [IU] | Freq: Three times a day (TID) | SUBCUTANEOUS | Status: DC
  Administered 2015-07-29 (×2): 4 [IU] via SUBCUTANEOUS
  Administered 2015-07-30 (×2): 7 [IU] via SUBCUTANEOUS
  Administered 2015-07-30: 3 [IU] via SUBCUTANEOUS
  Administered 2015-07-31: 11 [IU] via SUBCUTANEOUS
  Administered 2015-07-31: 7 [IU] via SUBCUTANEOUS
  Administered 2015-07-31: 15 [IU] via SUBCUTANEOUS

## 2015-07-29 NOTE — ED Notes (Signed)
Per eMS:  Pt here from work.  C/o left shoulder pain radiating to neck while on top of a machine at work which he had to climb up to.  Pt sts he came down from the machine and felt weak.  He went to the breakroom where the pain resolved, but he continues to have pressure under his left breast.  Pt c/o SOB increasing over the past two weeks.  EMS gave 324 ASA, no nitro.  Vitals stable, pt alert and oriented in room.

## 2015-07-29 NOTE — ED Notes (Signed)
Attempted to call report

## 2015-07-29 NOTE — Progress Notes (Signed)
This encounter was created in error - please disregard. This encounter was created in error - please disregard. This encounter was created in error - please disregard. 

## 2015-07-29 NOTE — Progress Notes (Addendum)
Called to cath lab, Joshua [name]'s cath has been cancelled for today as his Cr is elevated at 1.5 and require further IV hydration. He is on the cath board for tomorrow 07/30/2015  Signed, Almyra Deforest PA Pager: [phone]  Risk and benefit of procedure explained to the patient who display clear understanding and agree to proceed. Discussed with patient possible procedural risk include bleeding, vascular injury, renal injury, arrythmia, MI, stroke and loss of limb or life.    Attending:  ARB therapy will be held. We will start hydration today at 75 cc per hour of saline now. He needs a 2-D echocardiogram to assess LV function.  Linard Millers, M.D.

## 2015-07-29 NOTE — Progress Notes (Signed)
[place] for Heparin Indication: chest pain/ACS  No Known Allergies  Patient Measurements: Height: 5\' 9"  (175.3 cm) Weight: 295 lb 12.8 oz (134.174 kg) IBW/kg (Calculated) : 70.7  Ht: 69 in  Wt: 138 kg  IBW: 71 kg Heparin Dosing Weight: 91 kg  Vital Signs: Temp: 98.3 F (36.8 C) (08/16 1323) Temp Source: Oral (08/16 1323) BP: 127/72 mmHg (08/16 1323) Pulse Rate: 68 (08/16 1323)  Labs:  Recent Labs  07/29/15 0259 07/29/15 0301 07/29/15 0720 07/29/15 1030 07/29/15 1951  HGB 12.9* 12.6*  --   --   --   HCT 38.8* 37.0*  --   --   --   PLT 185  --   --   --   --   LABPROT  --   --  13.8  --   --   INR  --   --  1.04  --   --   HEPARINUNFRC  --   --   --  0.32 0.24*  CREATININE 1.62* 1.50*  --   --   --     Estimated Creatinine Clearance: 74.7 mL/min (by C-G formula based on Cr of 1.5).  Assessment: 59 y.o. male presents with CP. To begin heparin for r/o ACS.   Initial heparin level is within therapeutic level at 0.3. No bleeding issues noted, cbc within normal limits. Cath has been postponed until tomorrow due to scr elevated at 1.5  PM Heparin level low at 0.24.  Goal of Therapy:  Heparin level 0.3-0.7 units/ml Monitor platelets by anticoagulation protocol: Yes   Plan:  Increase heparin to 1700 units/hr Daily HL/CBC  Thank you Anette Guarneri, PharmD [phone]  07/29/2015 8:37 PM

## 2015-07-29 NOTE — Progress Notes (Signed)
ANTICOAGULATION CONSULT NOTE - Initial Consult  Pharmacy Consult for Heparin Indication: chest pain/ACS  No Known Allergies  Patient Measurements:    Ht: 69 in  Wt: 138 kg  IBW: 71 kg Heparin Dosing Weight: 91 kg  Vital Signs: Temp: 98.5 F (36.9 C) (08/16 0207) Temp Source: Oral (08/16 0207) BP: 130/92 mmHg (08/16 0215) Pulse Rate: 85 (08/16 0215)  Labs:  Recent Labs  07/29/15 0259 07/29/15 0301  HGB 12.9* 12.6*  HCT 38.8* 37.0*  PLT 185  --   CREATININE 1.62* 1.50*    CrCl cannot be calculated (Unknown ideal weight.).   Medical History: Past Medical History  Diagnosis Date  . Hypertension   . Hyperlipemia   . GERD (gastroesophageal reflux disease)   . Polyneuropathy in diabetes(357.2)   . Coronary artery disease   . CHF (congestive heart failure)   . Heart murmur   . Myocardial infarction 2012  . OSA on CPAP   . Pulmonary sarcoidosis     "problems w/it years ago; no problems anymore" (07/03/2014)  . Uncontrolled diabetes mellitus with complications     "type ii"  . Diabetic gastroparesis   . Gout     Medications:  See electronic med rec  Assessment: 59 y.o. male presents with CP. To begin heparin for r/o ACS. CBC stable.   Goal of Therapy:  Heparin level 0.3-0.7 units/ml Monitor platelets by anticoagulation protocol: Yes   Plan:  Heparin IV bolus 4000 units Heparin gtt at 1450 units/hr Will f/u heparin level in hours Daily heparin level and CBC  Sherlon Handing, PharmD, BCPS Clinical pharmacist, pager [phone] 07/29/2015,3:56 AM

## 2015-07-29 NOTE — ED Notes (Signed)
CBG 77. Nurse notified. Pts belongings (clothes, shoes, and 2 work tools) placed in bag and given to pt bedside to go upstairs with pt.

## 2015-07-29 NOTE — ED Provider Notes (Signed)
CSN: [id number]     Arrival date & time 07/29/15  0155 History  This chart was scribed for Angely Dietz, MD by Hansel Feinstein, ED Scribe. This patient was seen in room D36C/D36C and the patient's care was started at 3:16 AM.     Chief Complaint  Patient presents with  . Chest Pain   Patient is a 59 y.o. male presenting with shoulder pain. The history is provided by the patient. No language interpreter was used.  Shoulder Pain Location:  Shoulder Injury: no   Shoulder location:  L shoulder Pain details:    Quality:  Aching   Radiates to: Neck.   Severity:  Moderate   Onset quality:  Gradual   Timing:  Constant   Progression:  Improving Chronicity:  New Handedness:  Right-handed Dislocation: no   Relieved by:  Nothing Worsened by:  Nothing tried Ineffective treatments:  Rest Associated symptoms: neck pain   Associated symptoms: no fever   Risk factors: no concern for non-accidental trauma     HPI Comments: Joshua [name] is a 59 y.o. male with Hx of HTN, 2 MI (last episode 06/2014), HLD, GERD, CAD, CHF, cardiac stents who presents to the Emergency Department complaining of moderate, improving left shoulder pain that radiates to the neck, with tightness, onset tonight. He states associated SOB onset 3 weeks ago, diaphoresis, generalized weakness.  Pt reports that SOB is mildly relieved by resting, but he feels it all the time. He was at work when the pain began. 4 baby ASA given in EMS. No NTG given. CBG was 125 on arrival. He states he has an appointment with Cardiologist Dr. Claiborne Billings this morning. He is right handed. Non-smoker. He notes a Hx of injuries to his shoulders from football. He denies CP, nausea, vomiting.   Past Medical History  Diagnosis Date  . Hypertension   . Hyperlipemia   . GERD (gastroesophageal reflux disease)   . Polyneuropathy in diabetes(357.2)   . Coronary artery disease   . CHF (congestive heart failure)   . Heart murmur   . Myocardial infarction 2012  .  OSA on CPAP   . Pulmonary sarcoidosis     "problems w/it years ago; no problems anymore" (07/03/2014)  . Uncontrolled diabetes mellitus with complications     "type ii"  . Diabetic gastroparesis   . Gout    Past Surgical History  Procedure Laterality Date  . Incision and drainage Right 10/28/2013    Procedure: INCISION AND DRAINAGE Right small finger;  Surgeon: [name], MD;  Location: [facility];  Service: Orthopedics;  Laterality: Right;  . I&d extremity Right 10/30/2013    Procedure: IRRIGATION AND DEBRIDEMENT RIGHT SMALL FINGER POSSIBLE SECONDARY WOUND CLOSURE;  Surgeon: [name], MD;  Location: [facility];  Service: Orthopedics;  Laterality: Right;  [name] is available after 4pm  . I&d extremity Right 11/01/2013    Procedure:  IRRIGATION AND DEBRIDEMENT RIGHT  PROXIMAL PHALANGEAL LEVEL AMPUTATION;  Surgeon: [name], MD;  Location: [facility];  Service: Orthopedics;  Laterality: Right;  . Doppler echocardiography N/A 11-23-2011    TECHNICALLY DIFFICULT. LV GROSSLY NORMAL IN SIZE. LV SYSTOLIC FUNCTION IS MILDLY REDUCED. EF=45-50%. DOPPLER FLOW PATTERN WAS NORMAL FOR AGE. MILD  . Nuclear stress test N/A 12-29-2011    MILD PERFUSION DEFECT IS SEEN IN THE APICAL SEPTAL AMD MID INFEROSEPTAL REGIONS. THIS IS CONSISTANT WITH INFARCT/SCAR. NO EVIDENCE OF INDUCIBLE ISCHEMIA. EF 41%. LV SYSTOLIC FUNCTION IS MODERATELY REDUCED.   . Coronary  angioplasty with stent placement  10/31/2011    30 % MID ATRIOVENTRICULAR GROOVE CX STENOSIS. 90 % MID RCA.PTA TO LAD/STENTING OF TANDEM 60-70%. LAD STENOSIS 99% REDUCED TO 0%.3.0 X 32MM PROMUS DES POSTDILATED TO 3.25MM.  [name] Coronary angioplasty with stent placement  07/03/2014    "2"  . Breast lumpectomy Right ~ 2000    "benign"  . Toe amputation Right ~ 2010    "2nd toe"  . Toe amputation Right 2012    "3rd toe"  . Cataract extraction w/ intraocular lens  implant, bilateral Bilateral 2014-2015  . Percutaneous coronary stent intervention  (pci-s) Left 11/03/2011    Procedure: PERCUTANEOUS CORONARY STENT INTERVENTION (PCI-S);  Surgeon: [name], MD;  Location: [facility] CATH LAB;  Service: Cardiovascular;  Laterality: Left;  . Left heart cath Left 11/03/2011    Procedure: LEFT HEART CATH;  Surgeon: [name], MD;  Location: [facility] CATH LAB;  Service: Cardiovascular;  Laterality: Left;  . Left heart catheterization with coronary angiogram N/A 07/03/2014    Procedure: LEFT HEART CATHETERIZATION WITH CORONARY ANGIOGRAM;  Surgeon: [name], MD;  Location: [facility] CATH LAB;  Service: Cardiovascular;  Laterality: N/A;   History reviewed. No pertinent family history. Social History  Substance Use Topics  . Smoking status: Never Smoker   . Smokeless tobacco: Never Used  . Alcohol Use: Yes     Comment: Pt sts he binge drinks on the weekends    Review of Systems  Constitutional: Positive for diaphoresis. Negative for fever.  Respiratory: Positive for shortness of breath.   Cardiovascular: Negative for chest pain.  Gastrointestinal: Negative for nausea and vomiting.  Musculoskeletal: Positive for arthralgias and neck pain.  Neurological: Negative for weakness.  All other systems reviewed and are negative.   Allergies  Review of patient's allergies indicates no known allergies.  Home Medications   Prior to Admission medications   Medication Sig Start Date End Date Taking? Authorizing Provider  allopurinol (ZYLOPRIM) 300 MG tablet Take 300 mg by mouth daily.    Yes Historical Provider, MD  amLODipine-olmesartan (AZOR) 10-40 MG per tablet Take 1 tablet by mouth daily. 07/04/14  Yes Brett Canales, PA-C  aspirin EC 81 MG tablet Take 81 mg by mouth daily.   Yes Historical Provider, MD  doxazosin (CARDURA) 8 MG tablet Take 8 mg by mouth at bedtime. 03/21/15  Yes Historical Provider, MD  insulin NPH-regular (HUMULIN 70/30) (70-30) 100 UNIT/ML injection Inject 40 Units into the skin 2 (two) times daily. 11/02/13  Yes Janece Canterbury,  MD  isosorbide mononitrate (IMDUR) 30 MG 24 hr tablet TAKE 1 TABLET BY MOUTH EVERY DAY 07/18/15  Yes [name], MD  metFORMIN (GLUCOPHAGE) 500 MG tablet Take 1 tablet (500 mg total) by mouth 2 (two) times daily with a meal. 07/04/14  Yes Brett Canales, PA-C  metoprolol succinate (TOPROL-XL) 100 MG 24 hr tablet Take 1 tablet (100 mg total) by mouth daily. Take with or immediately following a meal. 10/09/13  Yes [name], MD  nitroGLYCERIN (NITROSTAT) 0.4 MG SL tablet Place 1 tablet (0.4 mg total) under the tongue every 5 (five) minutes as needed for chest pain. 10/19/13  Yes [name], MD  rosuvastatin (CRESTOR) 40 MG tablet Take 1 tablet (40 mg total) by mouth daily. 11/02/13  Yes Janece Canterbury, MD  spironolactone (ALDACTONE) 25 MG tablet Take 0.5 tablets (12.5 mg total) by mouth daily. 08/05/14  Yes [name], MD  ticagrelor (BRILINTA) 90 MG  TABS tablet Take 1 tablet (90 mg total) by mouth 2 (two) times daily. 07/04/14  Yes Brett Canales, PA-C  ciprofloxacin (CIPRO) 500 MG tablet Take 1 tablet (500 mg total) by mouth 2 (two) times daily. Patient not taking: Reported on 07/29/2015 03/28/15   Trula Slade, DPM  clindamycin (CLEOCIN) 300 MG capsule Take 1 capsule (300 mg total) by mouth 3 (three) times daily. Patient not taking: Reported on 07/29/2015 03/28/15   Trula Slade, DPM  hydrALAZINE (APRESOLINE) 25 MG tablet Take 1 tablet (25 mg total) by mouth 3 (three) times daily. Patient not taking: Reported on 07/29/2015 07/22/14   Brett Canales, PA-C  silver sulfADIAZINE (SILVADENE) 1 % cream Apply 1 application topically daily. Patient not taking: Reported on 07/29/2015 01/21/15   Trula Slade, DPM   BP 130/92 mmHg  Pulse 85  Temp(Src) 98.5 F (36.9 C) (Oral)  Resp 17  SpO2 99% Physical Exam  Constitutional: He is oriented to person, place, and time. He appears well-developed and well-nourished.  HENT:  Head: Normocephalic and atraumatic.  Mouth/Throat: Oropharynx is  clear and moist. No oropharyngeal exudate.  Eyes: Conjunctivae and EOM are normal. Pupils are equal, round, and reactive to light.  Neck: Normal range of motion. Neck supple.  Cardiovascular: Normal rate, regular rhythm and normal heart sounds.   Pulmonary/Chest: Effort normal and breath sounds normal. No respiratory distress. He has no wheezes. He has no rales.  Lungs CTA.   Abdominal: Soft. Bowel sounds are normal. He exhibits no distension and no mass. There is no tenderness. There is no rebound and no guarding.  Musculoskeletal: Normal range of motion. He exhibits no edema.  Neurological: He is alert and oriented to person, place, and time. He has normal reflexes.  Skin: Skin is warm and dry.  Psychiatric: He has a normal mood and affect. His behavior is normal.  Nursing note and vitals reviewed.   ED Course  Procedures (including critical care time) DIAGNOSTIC STUDIES: Oxygen Saturation is 96% on RA, adequate by my interpretation.    COORDINATION OF CARE: 3:21 AM Discussed treatment plan with pt at bedside and pt agreed to plan.   Labs Review Labs Reviewed  CBC WITH DIFFERENTIAL/PLATELET - Abnormal; Notable for the following:    Hemoglobin 12.9 (*)    HCT 38.8 (*)    All other components within normal limits  I-STAT CHEM 8, ED - Abnormal; Notable for the following:    BUN 21 (*)    Creatinine, Ser 1.50 (*)    Glucose, Bld 125 (*)    Calcium, Ion 1.24 (*)    Hemoglobin 12.6 (*)    HCT 37.0 (*)    All other components within normal limits  BASIC METABOLIC PANEL  Randolm Idol, ED    Imaging Review Dg Chest 2 View  07/29/2015   CLINICAL DATA:  Chronic shortness of breath. Generalized chest pain and left neck pain. Initial encounter.  EXAM: CHEST  2 VIEW  COMPARISON:  Chest radiograph performed 10/28/2013  FINDINGS: The lungs are well-aerated. Vascular congestion is noted, with mildly increased interstitial markings, which may reflect mild interstitial edema, similar in  appearance to 2014. There is no evidence of pleural effusion or pneumothorax.  The heart is borderline normal in size. No acute osseous abnormalities are seen.  IMPRESSION: Vascular congestion, with mildly increased interstitial markings, which may reflect mild interstitial edema, similar in appearance to 2014.   Electronically Signed   By: Francoise Schaumann.D.  On: 07/29/2015 02:39   I, Enrica Corliss, MD  , personally reviewed and evaluated these images and lab results as part of my medical decision-making.   EKG Interpretation None      MDM   Final diagnoses:  None    Results for orders placed or performed during the hospital encounter of 07/29/15  CBC with Differential/Platelet  Result Value Ref Range   WBC 8.9 4.0 - 10.5 K/uL   RBC 4.52 4.22 - 5.81 MIL/uL   Hemoglobin 12.9 (L) 13.0 - 17.0 g/dL   HCT 38.8 (L) 39.0 - 52.0 %   MCV 85.8 78.0 - 100.0 fL   MCH 28.5 26.0 - 34.0 pg   MCHC 33.2 30.0 - 36.0 g/dL   RDW 14.1 11.5 - 15.5 %   Platelets 185 150 - 400 K/uL   Neutrophils Relative % 77 43 - 77 %   Neutro Abs 6.9 1.7 - 7.7 K/uL   Lymphocytes Relative 16 12 - 46 %   Lymphs Abs 1.4 0.7 - 4.0 K/uL   Monocytes Relative 6 3 - 12 %   Monocytes Absolute 0.5 0.1 - 1.0 K/uL   Eosinophils Relative 1 0 - 5 %   Eosinophils Absolute 0.1 0.0 - 0.7 K/uL   Basophils Relative 0 0 - 1 %   Basophils Absolute 0.0 0.0 - 0.1 K/uL  Basic metabolic panel  Result Value Ref Range   Sodium 138 135 - 145 mmol/L   Potassium 3.9 3.5 - 5.1 mmol/L   Chloride 107 101 - 111 mmol/L   CO2 23 22 - 32 mmol/L   Glucose, Bld 125 (H) 65 - 99 mg/dL   BUN 18 6 - 20 mg/dL   Creatinine, Ser 1.62 (H) 0.61 - 1.24 mg/dL   Calcium 9.1 8.9 - 10.3 mg/dL   GFR calc non Af Amer 46 (L) >60 mL/min   GFR calc Af Amer 53 (L) >60 mL/min   Anion gap 8 5 - 15  I-Stat Chem 8, ED  Result Value Ref Range   Sodium 142 135 - 145 mmol/L   Potassium 3.9 3.5 - 5.1 mmol/L   Chloride 107 101 - 111 mmol/L   BUN 21 (H) 6 - 20  mg/dL   Creatinine, Ser 1.50 (H) 0.61 - 1.24 mg/dL   Glucose, Bld 125 (H) 65 - 99 mg/dL   Calcium, Ion 1.24 (H) 1.12 - 1.23 mmol/L   TCO2 21 0 - 100 mmol/L   Hemoglobin 12.6 (L) 13.0 - 17.0 g/dL   HCT 37.0 (L) 39.0 - 52.0 %  I-stat troponin, ED  Result Value Ref Range   Troponin i, poc 0.01 0.00 - 0.08 ng/mL   Comment 3           Dg Chest 2 View  07/29/2015   CLINICAL DATA:  Chronic shortness of breath. Generalized chest pain and left neck pain. Initial encounter.  EXAM: CHEST  2 VIEW  COMPARISON:  Chest radiograph performed 10/28/2013  FINDINGS: The lungs are well-aerated. Vascular congestion is noted, with mildly increased interstitial markings, which may reflect mild interstitial edema, similar in appearance to 2014. There is no evidence of pleural effusion or pneumothorax.  The heart is borderline normal in size. No acute osseous abnormalities are seen.  IMPRESSION: Vascular congestion, with mildly increased interstitial markings, which may reflect mild interstitial edema, similar in appearance to 2014.   Electronically Signed   By: Garald Balding M.D.   On: 07/29/2015 02:39    Call to cardiology who  will see the patient  I personally performed the services described in this documentation, which was scribed in my presence. The recorded information has been reviewed and is accurate.    Veatrice Kells, MD 07/29/15 [phone]

## 2015-07-29 NOTE — H&P (Signed)
ADMISSION HISTORY & PHYSICAL   Chief Complaint:  Shortness of breath, left shoulder and neck pain  Cardiologist: Dr. Claiborne Billings  Primary Care Physician: [name], MD  HPI:  [name] with a past medical history significant for coronary artery disease with anterolateral STEMI in November 2012. Catheterization revealed 99% mid LAD stenosis proximal to this there was a 60-70% stenosis after the first diagonal vessel. He also had 95% mid RCA stenosis and 30% circumflex stenosis. He underwent initial acute intervention to his LAD and a 3.0x32 mm Promus DES stent. 3 days later he underwent successful staged PCI to his 95% RCA stenosis and a 3.0x18 mm Resolute DES stent. At that time his presenting symptom was nausea and shortness of breath. At no point did he have any significant chest pain. Several years later he was having some increasing shortness of breath and uncontrolled hypertension. Patient was seen for nuclear stress test at the [facility] office on 07/03/14 and EKG changes were consistent with ischemia. I evaluated him at that time in the office and as his symptoms were very similar to his presentation previously, he was sent directly for cardiac catheterization prior to having stress images. LHC resulted in successful drug-eluting stent implantation in the distal right coronary with reduction and 90% stenosis to 0% with TIMI grade 3 flow. Successful drug-eluting stent implantation in the proximal LAD with reduction 95% stenosis to 0% with TIMI grade 3 flow Moderate stenosis in the mid circumflex. Inferior hypokinesis with preserved systolic function.  He now presents with shortness of breath which is similar to his presentation in the past as well as left shoulder and left neck pain for no clear reason. He was climbing on some printing press equipment when he became somewhat weak and diaphoretic. 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 He had schedule an appointment for later today due to these symptoms however given his problems this evening he decided to come directly to the emergency room.  PMHx:  Past Medical History  Diagnosis Date  . Hypertension   . Hyperlipemia   . GERD (gastroesophageal reflux disease)   . Polyneuropathy in diabetes(357.2)   . Coronary artery disease   . CHF (congestive heart failure)   . Heart murmur   . Myocardial infarction 2012  . OSA on CPAP   . Pulmonary sarcoidosis     "problems w/it years ago; no problems anymore" (07/03/2014)  . Uncontrolled diabetes mellitus with complications     "type ii"  . Diabetic gastroparesis   . Gout     Past Surgical History  Procedure Laterality Date  . Incision and drainage Right 10/28/2013    Procedure: INCISION AND DRAINAGE Right small finger;  Surgeon: [name], MD;  Location: [facility] ORS;  Service: Orthopedics;  Laterality: Right;  . I&d extremity Right 10/30/2013    Procedure: IRRIGATION AND DEBRIDEMENT RIGHT SMALL FINGER POSSIBLE SECONDARY WOUND CLOSURE;  Surgeon: [name], MD;  Location: [facility] OR[facility];  Service: Orthopedics;  Laterality: Right;  [name] is available after 4pm  . I&d extremity Right 11/01/2013    Procedure:  IRRIGATION AND DEBRIDEMENT RIGHT  PROXIMAL PHALANGEAL LEVEL AMPUTATION;  Surgeon: [name], MD;  Location: [facility]S;  Service: Orthopedics;  Laterality: Right;  . Doppler echocardiography N/A 11-23-2011    TECHNICALLY DIFFICULT. LV GROSSLY NORMAL IN SIZE. LV SYSTOLIC FUNCTION IS MILDLY REDUCED. EF=45-50%. DOPPLER  FLOW PATTERN WAS NORMAL FOR AGE. MILD  . Nuclear stress test N/A 12-29-2011    MILD PERFUSION DEFECT IS SEEN IN THE APICAL SEPTAL AMD MID INFEROSEPTAL REGIONS. THIS IS CONSISTANT WITH INFARCT/SCAR. NO EVIDENCE OF INDUCIBLE ISCHEMIA. EF 41%. LV SYSTOLIC FUNCTION IS MODERATELY REDUCED.   . Coronary angioplasty with stent placement  10/31/2011    30 %  MID ATRIOVENTRICULAR GROOVE CX STENOSIS. 90 % MID RCA.PTA TO LAD/STENTING OF TANDEM 60-70%. LAD STENOSIS 99% REDUCED TO 0%.3.0 X 32MM PROMUS DES POSTDILATED TO 3.25MM.  [name] Coronary angioplasty with stent placement  07/03/2014    "2"  . Breast lumpectomy Right ~ 2000    "benign"  . Toe amputation Right ~ 2010    "2nd toe"  . Toe amputation Right 2012    "3rd toe"  . Cataract extraction w/ intraocular lens  implant, bilateral Bilateral 2014-2015  . Percutaneous coronary stent intervention (pci-s) Left 11/03/2011    Procedure: PERCUTANEOUS CORONARY STENT INTERVENTION (PCI-S);  Surgeon: [name], MD;  Location: [facility] CATH LAB;  Service: Cardiovascular;  Laterality: Left;  . Left heart cath Left 11/03/2011    Procedure: LEFT HEART CATH;  Surgeon: [name], MD;  Location: [facility] CATH LAB;  Service: Cardiovascular;  Laterality: Left;  . Left heart catheterization with coronary angiogram N/A 07/03/2014    Procedure: LEFT HEART CATHETERIZATION WITH CORONARY ANGIOGRAM;  Surgeon: [name], MD;  Location: [facility] CATH LAB;  Service: Cardiovascular;  Laterality: N/A;    FAMHx:  History reviewed. No pertinent family history. no significant coronary disease  SOCHx:   reports that he has never smoked. He has never used smokeless tobacco. He reports that he drinks alcohol. He reports that he does not use illicit drugs.  ALLERGIES:  No Known Allergies  ROS: A comprehensive review of systems was negative except for: Respiratory: positive for dyspnea on exertion Cardiovascular: positive for chest pressure/discomfort  HOME MEDS:  (Not in a hospital admission)  LABS/IMAGING: Results for orders placed or performed during the hospital encounter of 07/29/15 (from the past 48 hour(s))  CBC with Differential/Platelet     Status: Abnormal   Collection Time: 07/29/15  2:59 AM  Result Value Ref Range   WBC 8.9 4.0 - 10.5 K/uL   RBC 4.52 4.22 - 5.81 MIL/uL   Hemoglobin 12.9 (L) 13.0 - 17.0 g/dL    HCT 38.8 (L) 39.0 - 52.0 %   MCV 85.8 78.0 - 100.0 fL   MCH 28.5 26.0 - 34.0 pg   MCHC 33.2 30.0 - 36.0 g/dL   RDW 14.1 11.5 - 15.5 %   Platelets 185 150 - 400 K/uL   Neutrophils Relative % 77 43 - 77 %   Neutro Abs 6.9 1.7 - 7.7 K/uL   Lymphocytes Relative 16 12 - 46 %   Lymphs Abs 1.4 0.7 - 4.0 K/uL   Monocytes Relative 6 3 - 12 %   Monocytes Absolute 0.5 0.1 - 1.0 K/uL   Eosinophils Relative 1 0 - 5 %   Eosinophils Absolute 0.1 0.0 - 0.7 K/uL   Basophils Relative 0 0 - 1 %   Basophils Absolute 0.0 0.0 - 0.1 K/uL  Basic metabolic panel     Status: Abnormal   Collection Time: 07/29/15  2:59 AM  Result Value Ref Range   Sodium 138 135 - 145 mmol/L   Potassium 3.9 3.5 - 5.1 mmol/L   Chloride 107 101 - 111 mmol/L   CO2 23 22 - 32 mmol/L  Glucose, Bld 125 (H) 65 - 99 mg/dL   BUN 18 6 - 20 mg/dL   Creatinine, Ser 1.62 (H) 0.61 - 1.24 mg/dL   Calcium 9.1 8.9 - 10.3 mg/dL   GFR calc non Af Amer 46 (L) >60 mL/min   GFR calc Af Amer 53 (L) >60 mL/min    Comment: (NOTE) The eGFR has been calculated using the CKD EPI equation. This calculation has not been validated in all clinical situations. eGFR's persistently <60 mL/min signify possible Chronic Kidney Disease.    Anion gap 8 5 - 15  I-stat troponin, ED     Status: None   Collection Time: 07/29/15  3:00 AM  Result Value Ref Range   Troponin i, poc 0.01 0.00 - 0.08 ng/mL   Comment 3            Comment: Due to the release kinetics of cTnI, a negative result within the first hours of the onset of symptoms does not rule out myocardial infarction with certainty. If myocardial infarction is still suspected, repeat the test at appropriate intervals.   I-Stat Chem 8, ED     Status: Abnormal   Collection Time: 07/29/15  3:01 AM  Result Value Ref Range   Sodium 142 135 - 145 mmol/L   Potassium 3.9 3.5 - 5.1 mmol/L   Chloride 107 101 - 111 mmol/L   BUN 21 (H) 6 - 20 mg/dL   Creatinine, Ser 1.50 (H) 0.61 - 1.24 mg/dL   Glucose,  Bld 125 (H) 65 - 99 mg/dL   Calcium, Ion 1.24 (H) 1.12 - 1.23 mmol/L   TCO2 21 0 - 100 mmol/L   Hemoglobin 12.6 (L) 13.0 - 17.0 g/dL   HCT 37.0 (L) 39.0 - 52.0 %  Brain natriuretic peptide     Status: Abnormal   Collection Time: 07/29/15  3:54 AM  Result Value Ref Range   B Natriuretic Peptide 148.7 (H) 0.0 - 100.0 pg/mL   Dg Chest 2 View  07/29/2015   CLINICAL DATA:  Chronic shortness of breath. Generalized chest pain and left neck pain. Initial encounter.  EXAM: CHEST  2 VIEW  COMPARISON:  Chest radiograph performed 10/28/2013  FINDINGS: The lungs are well-aerated. Vascular congestion is noted, with mildly increased interstitial markings, which may reflect mild interstitial edema, similar in appearance to 2014. There is no evidence of pleural effusion or pneumothorax.  The heart is borderline normal in size. No acute osseous abnormalities are seen.  IMPRESSION: Vascular congestion, with mildly increased interstitial markings, which may reflect mild interstitial edema, similar in appearance to 2014.   Electronically Signed   By: Garald Balding M.D.   On: 07/29/2015 02:39    VITALS: Filed Vitals:   07/29/15 0400  BP: 129/70  Pulse: 77  Temp:   Resp: 17    EXAM: General appearance: alert and no distress Neck: no carotid bruit and no JVD Lungs: clear to auscultation bilaterally Heart: regular rate and rhythm, S1, S2 normal, no murmur, click, rub or gallop Abdomen: soft, non-tender; bowel sounds normal; no masses,  no organomegaly and Obese Extremities: extremities normal, atraumatic, no cyanosis or edema Pulses: 2+ and symmetric Skin: Skin color, texture, turgor normal. No rashes or lesions Neurologic: Grossly normal Psych: Pleasant  IMPRESSION: Principal Problem:   Unstable angina Active Problems:   Hyperlipidemia   Essential hypertension   Presence of stent in anterior descending branch of left coronary artery   Uncontrolled diabetes mellitus with peripheral autonomic  neuropathy   Morbid  obesity   CAD in native artery   Sleep apnea- on C-pap   PLAN: 1. [name] is describing progressive increase in shortness of breath as well as left shoulder and pain that goes to the left neck. Symptoms are similar to what is had in the past. Currently he does not have any acute EKG changes, however and vitals are fairly stable. He was diaphoretic and has felt weak. I had previously seen him for atypical symptoms during stress testing although he had significant EKG changes and ultimately required PCI. I can see no other clear explanation for his symptoms and feel that unstable angina is certainly a possible diagnosis. Given his history of coronary disease in the past with neuropathy, he may be less aware of chest pain. I'm recommending a definitive heart catheterization today. The risks and benefits of the procedure were discussed and he is agreeable. He could have a clear liquid diet this morning and will be an add-on case likely for this afternoon. Heparin is a ready been initiated by the ER physician.  Pixie Casino, MD, [facility] Attending Cardiologist [place] 07/29/2015, 4:55 AM

## 2015-07-29 NOTE — Progress Notes (Signed)
[place] for Heparin Indication: chest pain/ACS  No Known Allergies  Patient Measurements: Height: 5\' 9"  (175.3 cm) Weight: 295 lb 12.8 oz (134.174 kg) IBW/kg (Calculated) : 70.7  Ht: 69 in  Wt: 138 kg  IBW: 71 kg Heparin Dosing Weight: 91 kg  Vital Signs: Temp: 98.5 F (36.9 C) (08/16 0701) Temp Source: Oral (08/16 0701) BP: 149/81 mmHg (08/16 0701) Pulse Rate: 78 (08/16 0701)  Labs:  Recent Labs  07/29/15 0259 07/29/15 0301 07/29/15 0720 07/29/15 1030  HGB 12.9* 12.6*  --   --   HCT 38.8* 37.0*  --   --   PLT 185  --   --   --   LABPROT  --   --  13.8  --   INR  --   --  1.04  --   HEPARINUNFRC  --   --   --  0.32  CREATININE 1.62* 1.50*  --   --     Estimated Creatinine Clearance: 74.7 mL/min (by C-G formula based on Cr of 1.5).  Assessment: 59 y.o. male presents with CP. To begin heparin for r/o ACS.   Initial heparin level is within therapeutic level at 0.3. No bleeding issues noted, cbc within normal limits. Cath has been postponed until tomorrow due to scr elevated at 1.5.  Goal of Therapy:  Heparin level 0.3-0.7 units/ml Monitor platelets by anticoagulation protocol: Yes   Plan:  Increase heparin to 1500 units/hr Check confirmatory level at 1800  Daily HL/CBC  Erin Hearing PharmD., BCPS Clinical Pharmacist Pager [phone] 07/29/2015 1:27 PM

## 2015-07-29 NOTE — Progress Notes (Signed)
UR Completed Nakima Fluegge Graves-Bigelow, RN,BSN [phone]  

## 2015-07-30 ENCOUNTER — Encounter (HOSPITAL_COMMUNITY): Payer: Self-pay | Admitting: Cardiovascular Disease

## 2015-07-30 ENCOUNTER — Inpatient Hospital Stay (HOSPITAL_COMMUNITY): Payer: BLUE CROSS/BLUE SHIELD

## 2015-07-30 ENCOUNTER — Encounter (HOSPITAL_COMMUNITY)
Admission: EM | Disposition: A | Discharge status: Home / Self Care | Payer: Self-pay | Source: Home / Self Care | Attending: Cardiovascular Disease

## 2015-07-30 DIAGNOSIS — I5031 Acute diastolic (congestive) heart failure: Secondary | ICD-10-CM | POA: Insufficient documentation

## 2015-07-30 DIAGNOSIS — N183 Chronic kidney disease, stage 3 (moderate): Secondary | ICD-10-CM

## 2015-07-30 HISTORY — PX: CARDIAC CATHETERIZATION: SHX172

## 2015-07-30 LAB — HEPARIN LEVEL (UNFRACTIONATED): Heparin Unfractionated: 0.37 IU/mL (ref 0.30–0.70)

## 2015-07-30 LAB — GLUCOSE, CAPILLARY
Glucose-Capillary: 209 mg/dL — ABNORMAL HIGH (ref 65–99)
Glucose-Capillary: 136 mg/dL — ABNORMAL HIGH (ref 65–99)
Glucose-Capillary: 206 mg/dL — ABNORMAL HIGH (ref 65–99)
Glucose-Capillary: 319 mg/dL — ABNORMAL HIGH (ref 65–99)

## 2015-07-30 LAB — CBC
HCT: 39.1 % (ref 39.0–52.0)
Hemoglobin: 12.8 g/dL — ABNORMAL LOW (ref 13.0–17.0)
MCH: 28.1 pg (ref 26.0–34.0)
MCHC: 32.7 g/dL (ref 30.0–36.0)
MCV: 85.9 fL (ref 78.0–100.0)
Platelets: 175 10*3/uL (ref 150–400)
RBC: 4.55 MIL/uL (ref 4.22–5.81)
RDW: 14.3 % (ref 11.5–15.5)
WBC: 5.2 10*3/uL (ref 4.0–10.5)

## 2015-07-30 LAB — BASIC METABOLIC PANEL
Anion gap: 7 (ref 5–15)
BUN: 20 mg/dL (ref 6–20)
CO2: 22 mmol/L (ref 22–32)
Calcium: 8.5 mg/dL — ABNORMAL LOW (ref 8.9–10.3)
Chloride: 109 mmol/L (ref 101–111)
Creatinine, Ser: 1.3 mg/dL — ABNORMAL HIGH (ref 0.61–1.24)
GFR calc Af Amer: >60 mL/min (ref >60)
GFR calc non Af Amer: 60 mL/min — ABNORMAL LOW (ref >60)
Glucose, Bld: 258 mg/dL — ABNORMAL HIGH (ref 65–99)
Potassium: 4.6 mmol/L (ref 3.5–5.1)
Sodium: 138 mmol/L (ref 135–145)

## 2015-07-30 LAB — POCT ACTIVATED CLOTTING TIME: Activated Clotting Time: 276 seconds

## 2015-07-30 SURGERY — LEFT HEART CATH AND CORONARY ANGIOGRAPHY

## 2015-07-30 MED ORDER — SODIUM CHLORIDE 0.9 % IJ SOLN
3.0000 mL | Freq: Two times a day (BID) | INTRAMUSCULAR | Status: DC
  Administered 2015-07-30: 3 mL via INTRAVENOUS

## 2015-07-30 MED ORDER — HEPARIN (PORCINE) IN NACL 2-0.9 UNIT/ML-% IJ SOLN
INTRAMUSCULAR | Status: AC
  Filled 2015-07-30: qty 1500

## 2015-07-30 MED ORDER — HEPARIN SODIUM (PORCINE) 1000 UNIT/ML IJ SOLN
INTRAMUSCULAR | Status: AC
  Filled 2015-07-30: qty 1

## 2015-07-30 MED ORDER — ASPIRIN EC 81 MG PO TBEC
81.0000 mg | DELAYED_RELEASE_TABLET | Freq: Every day | ORAL | Status: DC
  Administered 2015-07-30 – 2015-07-31 (×2): 81 mg via ORAL
  Filled 2015-07-30 (×2): qty 1

## 2015-07-30 MED ORDER — FENTANYL CITRATE (PF) 100 MCG/2ML IJ SOLN
INTRAMUSCULAR | Status: AC
Start: 2015-07-30 — End: 2015-07-30
  Filled 2015-07-30: qty 4

## 2015-07-30 MED ORDER — SODIUM CHLORIDE 0.9 % IJ SOLN: 3.0000 mL | INTRAMUSCULAR | Status: DC | PRN

## 2015-07-30 MED ORDER — ADENOSINE (DIAGNOSTIC) 3 MG/ML IV SOLN
20.0000 mL | Freq: Once | INTRAVENOUS | Status: DC
  Filled 2015-07-30: qty 20

## 2015-07-30 MED ORDER — FUROSEMIDE 40 MG PO TABS
40.0000 mg | ORAL_TABLET | Freq: Every day | ORAL | Status: DC
  Administered 2015-07-31: 40 mg via ORAL
  Filled 2015-07-30: qty 1

## 2015-07-30 MED ORDER — NITROGLYCERIN 1 MG/10 ML FOR IR/CATH LAB
INTRA_ARTERIAL | Status: DC | PRN
  Administered 2015-07-30: 12:00:00

## 2015-07-30 MED ORDER — IOHEXOL 350 MG/ML SOLN
INTRAVENOUS | Status: DC | PRN
  Administered 2015-07-30: 120 mL via INTRAVENOUS

## 2015-07-30 MED ORDER — NITROGLYCERIN 1 MG/10 ML FOR IR/CATH LAB
INTRA_ARTERIAL | Status: AC
  Filled 2015-07-30: qty 10

## 2015-07-30 MED ORDER — MIDAZOLAM HCL 2 MG/2ML IJ SOLN
INTRAMUSCULAR | Status: DC | PRN
  Administered 2015-07-30: 2 mg via INTRAVENOUS

## 2015-07-30 MED ORDER — SODIUM CHLORIDE 0.9 % IV SOLN
INTRAVENOUS | Status: AC
  Administered 2015-07-30: 12:00:00 via INTRAVENOUS

## 2015-07-30 MED ORDER — FENTANYL CITRATE (PF) 100 MCG/2ML IJ SOLN
INTRAMUSCULAR | Status: DC | PRN
  Administered 2015-07-30: 50 ug via INTRAVENOUS

## 2015-07-30 MED ORDER — MIDAZOLAM HCL 2 MG/2ML IJ SOLN
INTRAMUSCULAR | Status: AC
Start: 2015-07-30 — End: 2015-07-30
  Filled 2015-07-30: qty 4

## 2015-07-30 MED ORDER — HEPARIN SODIUM (PORCINE) 1000 UNIT/ML IJ SOLN
INTRAMUSCULAR | Status: DC | PRN
  Administered 2015-07-30: 6000 [IU] via INTRAVENOUS
  Administered 2015-07-30: 4000 [IU] via INTRAVENOUS

## 2015-07-30 MED ORDER — LIDOCAINE HCL (PF) 1 % IJ SOLN
INTRAMUSCULAR | Status: AC
  Filled 2015-07-30: qty 30

## 2015-07-30 MED ORDER — SODIUM CHLORIDE 0.9 % IV SOLN: 250.0000 mL | INTRAVENOUS | Status: DC | PRN

## 2015-07-30 MED ORDER — VERAPAMIL HCL 2.5 MG/ML IV SOLN
INTRAVENOUS | Status: DC | PRN
  Administered 2015-07-30: 11:00:00 via INTRA_ARTERIAL

## 2015-07-30 MED ORDER — VERAPAMIL HCL 2.5 MG/ML IV SOLN
INTRAVENOUS | Status: AC
  Filled 2015-07-30: qty 2

## 2015-07-30 MED ORDER — ADENOSINE (DIAGNOSTIC) 140MCG/KG/MIN
INTRAVENOUS | Status: DC | PRN
  Administered 2015-07-30: 140 ug/kg/min via INTRAVENOUS

## 2015-07-30 MED ORDER — FUROSEMIDE 10 MG/ML IJ SOLN
40.0000 mg | Freq: Once | INTRAMUSCULAR | Status: AC
  Administered 2015-07-30: 40 mg via INTRAVENOUS
  Filled 2015-07-30: qty 4

## 2015-07-30 SURGICAL SUPPLY — 13 items
CATH INFINITI 5 FR JL3.5 (CATHETERS) ×2 IMPLANT
CATH INFINITI 5FR ANG PIGTAIL (CATHETERS) ×2 IMPLANT
CATH INFINITI JR4 5F (CATHETERS) ×2 IMPLANT
CATH VISTA GUIDE 6FR JR4 (CATHETERS) ×1 IMPLANT
DEVICE RAD COMP TR BAND LRG (VASCULAR PRODUCTS) ×2 IMPLANT
GLIDESHEATH SLEND SS 6F .021 (SHEATH) ×2 IMPLANT
GUIDEWIRE PRESSURE COMET II (WIRE) ×1 IMPLANT
KIT HEART LEFT (KITS) ×2 IMPLANT
PACK CARDIAC CATHETERIZATION (CUSTOM PROCEDURE TRAY) ×2 IMPLANT
SYR MEDRAD MARK V 150ML (SYRINGE) ×2 IMPLANT
TRANSDUCER W/STOPCOCK (MISCELLANEOUS) ×2 IMPLANT
TUBING CIL FLEX 10 FLL-RA (TUBING) ×2 IMPLANT
WIRE SAFE-T 1.5MM-J .035X260CM (WIRE) ×2 IMPLANT

## 2015-07-30 NOTE — Progress Notes (Signed)
Results for YANDELL, MCJUNKINS (MRN [id number]) as of 07/30/2015 14:49  Ref. Range 07/29/2015 21:02 07/30/2015 07:46 07/30/2015 12:01  Glucose-Capillary Latest Ref Range: 65-99 mg/dL 238 (H) 209 (H) 136 (H)  Noted that 1200 CBG was 136 mg/dl.  If CBGs are trending down less than 150 mg/dl, patient may not need basal insulin . Continue Novolog correction scale TID & HS. Will continue to follow. Harvel Ricks RN BSN CDE

## 2015-07-30 NOTE — Care Management Note (Signed)
Case Management Note  Patient Details  Name: Joshua [name] MRN: [id number] Date of Birth: 1956/08/23  Subjective/Objective:  Pt admitted for cp. Significant cardiac hx. IV hydration overnight due to increase in Cr. Plan for cardiac cath 07-30-15.                   Action/Plan: CM will continue to monitor for disposition needs.    Expected Discharge Date:                  Expected Discharge Plan:  Home/Self Care  In-House Referral:     Discharge planning Services  CM Consult  Post Acute Care Choice:    Choice offered to:     DME Arranged:    DME Agency:     HH Arranged:    HH Agency:     Status of Service:  In process, will continue to follow  Medicare Important Message Given:    Date Medicare IM Given:    Medicare IM give by:    Date Additional Medicare IM Given:    Additional Medicare Important Message give by:     If discussed at [place] of Stay Meetings, dates discussed:    Additional Comments:  Bethena Roys, RN 07/30/2015, 11:11 AM

## 2015-07-30 NOTE — Progress Notes (Signed)
Noted that patient has been on 70/30 mixed insulin 40 units BID and Metformin at home. Current meds: Novolog RESISTANT TID & HS. CBGs are currently in the 200's. If CBGs continue to be greater than 180 mg/dl, recommend starting basal insulin.  If patient remains NPO, then start Lantus 20 units daily. (this dose is 1/2 of the recommended dose of Lantus 45 units daily which would be equal to her home 70/30 dose of 40 units BID)  If eating on a regular basis, could start 70/30 mixed insulin 20 units BID to start and titrate up as needed. Will continue to follow while in hospital. Harvel Ricks RN BSN CDE

## 2015-07-30 NOTE — Progress Notes (Signed)
Patient Name: Joshua [name] Date of Encounter: 07/30/2015  Primary Cardiologist: Dr. Gwenlyn Found   Principal Problem:   Unstable angina Active Problems:   Hyperlipidemia   Essential hypertension   Presence of stent in anterior descending branch of left coronary artery   Uncontrolled diabetes mellitus with peripheral autonomic neuropathy   Morbid obesity   CAD in native artery   Sleep apnea- on C-pap    SUBJECTIVE  Denies any CP or SOB last night.   CURRENT MEDS . allopurinol  300 mg Oral Daily  . amLODipine  10 mg Oral Daily  . aspirin EC  81 mg Oral Daily  . doxazosin  8 mg Oral QHS  . insulin aspart  0-20 Units Subcutaneous TID WC  . insulin aspart  0-5 Units Subcutaneous QHS  . isosorbide mononitrate  30 mg Oral Daily  . metoprolol succinate  100 mg Oral Daily  . rosuvastatin  40 mg Oral q1800  . sodium chloride  3 mL Intravenous Q12H  . spironolactone  12.5 mg Oral Daily  . ticagrelor  90 mg Oral BID    OBJECTIVE  Filed Vitals:   07/29/15 0701 07/29/15 1323 07/29/15 2051 07/30/15 0543  BP: 149/81 127/72 188/96 173/88  Pulse: 78 68  69  Temp: 98.5 F (36.9 C) 98.3 F (36.8 C)  98 F (36.7 C)  TempSrc: Oral Oral  Oral  Resp: 18 17    Height: $Remove'5\' 9"'KwCESug$  (1.753 m)     Weight: 295 lb 12.8 oz (134.174 kg)     SpO2: 99% 100%  100%    Intake/Output Summary (Last 24 hours) at 07/30/15 0907 Last data filed at 07/29/15 1727  Gross per 24 hour  Intake    240 ml  Output      0 ml  Net    240 ml   Filed Weights   07/29/15 0701  Weight: 295 lb 12.8 oz (134.174 kg)    PHYSICAL EXAM  General: Pleasant, NAD. Neuro: Alert and oriented X 3. Moves all extremities spontaneously. Psych: Normal affect. HEENT:  Normal  Neck: Supple without bruits or JVD. Lungs:  Resp regular and unlabored, CTA. Heart: RRR no s3, s4, or murmurs. Abdomen: Soft, non-tender, non-distended, BS + x 4.  Extremities: No clubbing, cyanosis or edema. DP/PT/Radials 2+ and equal  bilaterally.  Accessory Clinical Findings  CBC  Recent Labs  07/29/15 0259 07/29/15 0301 07/30/15 0303  WBC 8.9  --  5.2  NEUTROABS 6.9  --   --   HGB 12.9* 12.6* 12.8*  HCT 38.8* 37.0* 39.1  MCV 85.8  --  85.9  PLT 185  --  096   Basic Metabolic Panel  Recent Labs  07/29/15 0259 07/29/15 0301 07/30/15 0303  NA 138 142 138  K 3.9 3.9 4.6  CL 107 107 109  CO2 23  --  22  GLUCOSE 125* 125* 258*  BUN 18 21* 20  CREATININE 1.62* 1.50* 1.30*  CALCIUM 9.1  --  8.5*    TELE NSR without significant ST-T wave changes    ECG  Poor R wave progression without significant ST-T wave changes  Echocardiogram  pending    Radiology/Studies  Dg Chest 2 View  07/29/2015   CLINICAL DATA:  Chronic shortness of breath. Generalized chest pain and left neck pain. Initial encounter.  EXAM: CHEST  2 VIEW  COMPARISON:  Chest radiograph performed 10/28/2013  FINDINGS: The lungs are well-aerated. Vascular congestion is noted, with mildly increased interstitial markings, which may  reflect mild interstitial edema, similar in appearance to 2014. There is no evidence of pleural effusion or pneumothorax.  The heart is borderline normal in size. No acute osseous abnormalities are seen.  IMPRESSION: Vascular congestion, with mildly increased interstitial markings, which may reflect mild interstitial edema, similar in appearance to 2014.   Electronically Signed   By: Garald Balding M.D.   On: 07/29/2015 02:39    ASSESSMENT AND PLAN  1. Unstable angina  - originally planned for cath on 8/16, cath delayed given Cr 1.5 requiring further IV hydration. Hydrated overnight, Cr 1.3 this morning  - EKG poor R wave progression in anterior lead, otherwise to significant ST-T wave changes  - plan for LHC today. Risk and benefit explained extensively yesterday.   - pending echo  2. CAD  - h/o anterolateral STEMI 10/2011, cath 99% mid LAD stenosis proximal to this there was a 60-70% stenosis after the  first diagonal vessel. Lesion treated with 3.0x32 mm Promus DES. He also had 95% mid RCA stenosis treated in a staged fashion with 3.0x18 mm Resolute DES and 30% circumflex stenosis  - cath 2015 DES to distal RCA and LAD  3. HTN 4. HLD 5. DM 6. OSA on CPAP 7. CKD stage III: Cr 1.6 on arrival, one yr ago his Cr was 1.06-1.1, s/p IVF hydration, Cr 1.3 this morning  Signed, Almyra Deforest PA-C Pager: [phone]

## 2015-07-30 NOTE — Interval H&P Note (Signed)
History and Physical Interval Note:  07/30/2015 10:30 AM  Joshua [name]  has presented today for cardiac cath with the diagnosis of unstable angina  The various methods of treatment have been discussed with the patient and family. After consideration of risks, benefits and other options for treatment, the patient has consented to  Procedure(s): Left Heart Cath and Coronary Angiography (N/A) as a surgical intervention .  The patient's history has been reviewed, patient examined, no change in status, stable for surgery.  I have reviewed the patient's chart and labs.  Questions were answered to the patient's satisfaction.    Cath Lab Visit (complete for each Cath Lab visit)  Clinical Evaluation Leading to the Procedure:   ACS: No.  Non-ACS:    Anginal Classification: CCS III  Anti-ischemic medical therapy: Maximal Therapy (2 or more classes of medications)  Non-Invasive Test Results: No non-invasive testing performed  Prior CABG: No previous CABG         Joshua [name]

## 2015-07-30 NOTE — Progress Notes (Signed)
[place] for Heparin Indication: chest pain/ACS  No Known Allergies  Patient Measurements: Height: 5\' 9"  (175.3 cm) Weight: 295 lb 12.8 oz (134.174 kg) IBW/kg (Calculated) : 70.7  Ht: 69 in  Wt: 138 kg  IBW: 71 kg Heparin Dosing Weight: 91 kg  Vital Signs: BP: 188/96 mmHg (08/16 2051)  Labs:  Recent Labs  07/29/15 0259 07/29/15 0301 07/29/15 0720 07/29/15 1030 07/29/15 1951 07/30/15 0303  HGB 12.9* 12.6*  --   --   --  12.8*  HCT 38.8* 37.0*  --   --   --  39.1  PLT 185  --   --   --   --  175  LABPROT  --   --  13.8  --   --   --   INR  --   --  1.04  --   --   --   HEPARINUNFRC  --   --   --  0.32 0.24* 0.37  CREATININE 1.62* 1.50*  --   --   --   --     Estimated Creatinine Clearance: 74.7 mL/min (by C-G formula based on Cr of 1.5).  Assessment: 59 y.o. male presents with CP. To begin heparin for r/o ACS.   Heparin level is now within therapeutic level at 0.37. No bleeding issues noted, cbc within normal limits. Cath planned for today at 10 AM   Goal of Therapy:  Heparin level 0.3-0.7 units/ml Monitor platelets by anticoagulation protocol: Yes   Plan:  Continue heparin at 1700 units/hr Daily HL/CBC F/u after cath   Albertina Parr, PharmD., BCPS Clinical Pharmacist Pager [phone]

## 2015-07-31 ENCOUNTER — Other Ambulatory Visit: Payer: Self-pay | Admitting: Physician Assistant

## 2015-07-31 ENCOUNTER — Ambulatory Visit (HOSPITAL_COMMUNITY): Payer: BLUE CROSS/BLUE SHIELD

## 2015-07-31 ENCOUNTER — Telehealth: Payer: Self-pay | Admitting: Cardiovascular Disease

## 2015-07-31 DIAGNOSIS — R079 Chest pain, unspecified: Secondary | ICD-10-CM

## 2015-07-31 DIAGNOSIS — I209 Angina pectoris, unspecified: Secondary | ICD-10-CM

## 2015-07-31 DIAGNOSIS — I509 Heart failure, unspecified: Secondary | ICD-10-CM

## 2015-07-31 DIAGNOSIS — I5031 Acute diastolic (congestive) heart failure: Secondary | ICD-10-CM

## 2015-07-31 DIAGNOSIS — E8770 Fluid overload, unspecified: Secondary | ICD-10-CM

## 2015-07-31 LAB — CBC
HCT: 37.1 % — ABNORMAL LOW (ref 39.0–52.0)
Hemoglobin: 12.4 g/dL — ABNORMAL LOW (ref 13.0–17.0)
MCH: 28.4 pg (ref 26.0–34.0)
MCHC: 33.4 g/dL (ref 30.0–36.0)
MCV: 85.1 fL (ref 78.0–100.0)
Platelets: 175 10*3/uL (ref 150–400)
RBC: 4.36 MIL/uL (ref 4.22–5.81)
RDW: 14.1 % (ref 11.5–15.5)
WBC: 5.1 10*3/uL (ref 4.0–10.5)

## 2015-07-31 LAB — BASIC METABOLIC PANEL
Anion gap: 7 (ref 5–15)
BUN: 17 mg/dL (ref 6–20)
CO2: 24 mmol/L (ref 22–32)
Calcium: 8.6 mg/dL — ABNORMAL LOW (ref 8.9–10.3)
Chloride: 107 mmol/L (ref 101–111)
Creatinine, Ser: 1.3 mg/dL — ABNORMAL HIGH (ref 0.61–1.24)
GFR calc Af Amer: >60 mL/min (ref >60)
GFR calc non Af Amer: 60 mL/min — ABNORMAL LOW (ref >60)
Glucose, Bld: 231 mg/dL — ABNORMAL HIGH (ref 65–99)
Potassium: 4.2 mmol/L (ref 3.5–5.1)
Sodium: 138 mmol/L (ref 135–145)

## 2015-07-31 LAB — GLUCOSE, CAPILLARY
Glucose-Capillary: 205 mg/dL — ABNORMAL HIGH (ref 65–99)
Glucose-Capillary: 263 mg/dL — ABNORMAL HIGH (ref 65–99)
Glucose-Capillary: 332 mg/dL — ABNORMAL HIGH (ref 65–99)

## 2015-07-31 MED ORDER — FUROSEMIDE 40 MG PO TABS: 40.0000 mg | ORAL_TABLET | Freq: Every day | ORAL | Status: DC

## 2015-07-31 NOTE — Progress Notes (Signed)
  Echocardiogram 2D Echocardiogram has been performed.  Diamond Nickel 07/31/2015, 1:43 PM

## 2015-07-31 NOTE — Progress Notes (Signed)
Results for AKIM, WATKINSON (MRN [id number]) as of 07/31/2015 11:47  Ref. Range 07/30/2015 16:31 07/30/2015 21:04 07/31/2015 05:17 07/31/2015 07:34 07/31/2015 11:32  Glucose-Capillary Latest Ref Range: 65-99 mg/dL 206 (H) 319 (H)  205 (H) 263 (H)  CBGs continue to be greater than 180 mg/dl. Recommend starting NPH insulin 20 units BID and continuing Novolog RESISTANT correction scale TID & HS. Patient takes NPH 40 units BID at home. Will continue to follow while in hospital. Harvel Ricks RN BSN CDE

## 2015-07-31 NOTE — Discharge Instructions (Signed)
No driving for 24 hours. No lifting over 5 lbs for 1 week. No sexual activity for 1 week. Keep procedure site clean & dry. If you notice increased pain, swelling, bleeding or pus, call/return!  You may shower, but no soaking baths/hot tubs/pools for 1 week.   Please hold off on starting Metformin for 48 hours week after discharge, you can restart Metformin on Sat 08/02/2015

## 2015-07-31 NOTE — Discharge Summary (Signed)
Discharge Summary   Patient ID: Joshua [name],  MRN: [id number], DOB/AGE: 1956/02/14 59 y.o.  Admit date: 07/29/2015 Discharge date: 07/31/2015  Primary Care Provider: [name] Primary Cardiologist: Dr. Gwenlyn Found  Discharge Diagnoses Principal Problem:   Chest pain Active Problems:   Hyperlipidemia   Essential hypertension   Presence of stent in anterior descending branch of left coronary artery   Uncontrolled diabetes mellitus with peripheral autonomic neuropathy   Morbid obesity   CAD in native artery   Sleep apnea- on C-pap   Coronary artery disease involving native coronary artery of native heart without angina pectoris   Angina pectoris   Acute diastolic heart failure   Allergies No Known Allergies  Procedures  Echocardiogram 07/31/2015 - Left ventricle: The cavity size was normal. Wall thickness was increased in a pattern of moderate LVH. Systolic function was mildly reduced. The estimated ejection fraction was in the range of 45% to 50%. There is akinesis of the mid-apicalanteroseptal myocardium. Doppler parameters are consistent with abnormal left ventricular relaxation (grade 1 diastolic dysfunction).    Cardiac catheterization 07/30/2015 Conclusion     Previously placed DES in mid RCA with diffuse 50% restenosis.  Previously placed DES distal RCA with diffuse 10% restenosis  RPDA lesion, 30% stenosed.  Mid Cx lesion, 60% stenosed, unchanged.  2nd Mrg lesion, 30% stenosed.  3rd Mrg lesion, 30% stenosed.  Proximal to mid LAD with two overlapping DES, 10% diffuse restenosis.  Ost 1st Diag to 1st Diag lesion, 40% stenosed, jailed by stent.  Dist LAD lesion, 10% stenosed.  There is mild left ventricular systolic dysfunction.  1. Triple vessel CAD 2. Patent overlapping stents proximal to mid LAD with minimal restenosis.  3. Patent mid RCA stent with diffuse 50% restenosis, unchanged in appearance since 2015. FFR 0.90 of the  RCA 4. Patent distal RCA stent with minimal restenosis.  5. Moderate mid Circumflex lesion, unchanged from cath in 2015.  6. Mild LV systolic dysfunction 7. Elevated LV filling pressure.   Recommendations: Continue medical management of CAD. I think he would benefit from diuresis. Will give one dose of IV Lasix today. Follow BMET in am. If his dyspnea improves with diuresis, would plan to continue Lasix at home.       Hospital Course  The patient is a 59 year old male with past medical history significant for CAD s/p anterolateral STEMI in November 2012, HTN, HLD, DM, obstructive sleep apnea on CPAP. During his anterolateral STEMI in November 2012, cardiac catheterization at that time showed 99% mid LAD stenosis with a more proximal 60-70% stenosis after the first diagonal, he also had 95% mid RCA stenosis, 30% left circumflex stenosis. He underwent PCI to LAD with staged PCI to RCA 3 days later. He presented to [facility] on 07/29/2015 with complaint of shortness of breath which is similar to his presentation in the past as well as left shoulder and neck pain without clear reason. He was admitted to cardiology service, given his symptom, it was concerning for unstable angina, after discussing various options, he agreed to undergo cardiac catheterization.  His cardiac catheterization was initially scheduled for 8/16, however this was canceled as his creatinine was elevated at 1.5. He was hydrated with IV fluid overnight and underwent diagnostic cardiac catheterization on 07/30/2015 when his Cr improved to 1.3. Cardiac catheterization showed triple vessel CAD, 60% mid LCx stenosis unchanged from 2015, patent mid RCA with diffuse 50% restenosis unchanged in appearance since 2015, FFR 0.9. Patent distal RCA stent with 10%  diffuse stenosis. EF 45-50%, elevated LV filling pressure with LVEDP 20. Given the elevated LVEDP of 20, he was given single dose of 40 mg IV Lasix in the cath lab and started  on 40 mg daily PO Lasix. Echocardiogram was obtained on 07/31/2015, which showed EF 45-50%, moderate LVH, akinesis of mid apical anteroseptal myocardium, grade 1 diastolic dysfunction. Cardiac cath showed EF of 45-50% and elevated LV EDP, it is questionable at this time whether or not the LVEDP is related to prolonged fluid hydration >24 hrs and delayed cath. After IV lasix, he was no longer short of breath and appears to be euvolemic on exam.  I have arranged a seven-day transition of care follow-up as well as appointment to check his renal function and K via BMET. If his creatinine does trend up, he may need to stop the Lasix. Since his work with local [employer] involving strenuous activity, I have written him work note to go back to work on 08/11/2015. I have advised him to hold metformin for 48 hours after cath.  Discharge Vitals Blood pressure 139/76, pulse 70, temperature 98.1 F (36.7 C), temperature source Oral, resp. rate 18, height $RemoveBe'5\' 9"'DmtYovXIZ$  (1.753 m), weight 295 lb 8 oz (134.038 kg), SpO2 97 %.  Filed Weights   07/29/15 0701 07/31/15 0437  Weight: 295 lb 12.8 oz (134.174 kg) 295 lb 8 oz (134.038 kg)    Labs  CBC  Recent Labs  07/29/15 0259  07/30/15 0303 07/31/15 0517  WBC 8.9  --  5.2 5.1  NEUTROABS 6.9  --   --   --   HGB 12.9*  < > 12.8* 12.4*  HCT 38.8*  < > 39.1 37.1*  MCV 85.8  --  85.9 85.1  PLT 185  --  175 175  < > = values in this interval not displayed. Basic Metabolic Panel  Recent Labs  07/30/15 0303 07/31/15 0517  NA 138 138  K 4.6 4.2  CL 109 107  CO2 22 24  GLUCOSE 258* 231*  BUN 20 17  CREATININE 1.30* 1.30*  CALCIUM 8.5* 8.6*    Disposition  Pt is being discharged home today in good condition.  Follow-up Plans & Appointments      Follow-up Information    Follow up with Murray Hodgkins, NP On 08/08/2015.   Specialties:  Nurse Practitioner, Cardiology, Radiology   Why:  8:30am   Contact information:   [address] [address] [phone]       Follow up with [facility] On 08/07/2015.   Specialty:  Cardiology   Why:  Obtain BMET lab after starting lasix to monitor renal function and potassium. Arrive between lab hours from [employer] [facility] information:   [address], [place]      Discharge Medications    Medication List    STOP taking these medications        ciprofloxacin 500 MG tablet  Commonly known as:  CIPRO     clindamycin 300 MG capsule  Commonly known as:  CLEOCIN     hydrALAZINE 25 MG tablet  Commonly known as:  APRESOLINE     silver sulfADIAZINE 1 % cream  Commonly known as:  SILVADENE      TAKE these medications        allopurinol 300 MG tablet  Commonly known as:  ZYLOPRIM  Take 300 mg by mouth daily.  amLODipine-olmesartan 10-40 MG per tablet  Commonly known as:  AZOR  Take 1 tablet by mouth daily.     aspirin EC 81 MG tablet  Take 81 mg by mouth daily.     doxazosin 8 MG tablet  Commonly known as:  CARDURA  Take 8 mg by mouth at bedtime.     furosemide 40 MG tablet  Commonly known as:  LASIX  Take 1 tablet (40 mg total) by mouth daily.     insulin NPH-regular Human (70-30) 100 UNIT/ML injection  Commonly known as:  HUMULIN 70/30  Inject 40 Units into the skin 2 (two) times daily.     isosorbide mononitrate 30 MG 24 hr tablet  Commonly known as:  IMDUR  TAKE 1 TABLET BY MOUTH EVERY DAY     metFORMIN 500 MG tablet  Commonly known as:  GLUCOPHAGE  Take 1 tablet (500 mg total) by mouth 2 (two) times daily with a meal.     metoprolol succinate 100 MG 24 hr tablet  Commonly known as:  TOPROL-XL  Take 1 tablet (100 mg total) by mouth daily. Take with or immediately following a meal.     nitroGLYCERIN 0.4 MG SL tablet  Commonly known as:  NITROSTAT  Place 1 tablet (0.4 mg total) under the tongue every 5 (five) minutes as needed for chest pain.      rosuvastatin 40 MG tablet  Commonly known as:  CRESTOR  Take 1 tablet (40 mg total) by mouth daily.     spironolactone 25 MG tablet  Commonly known as:  ALDACTONE  Take 0.5 tablets (12.5 mg total) by mouth daily.     ticagrelor 90 MG Tabs tablet  Commonly known as:  BRILINTA  Take 1 tablet (90 mg total) by mouth 2 (two) times daily.        Outstanding Labs/Studies  BMET next week before followup  Duration of Discharge Encounter   Greater than 30 minutes including physician time.  Hilbert Corrigan PA-C Pager: [phone] 07/31/2015, 4:46 PM

## 2015-07-31 NOTE — Telephone Encounter (Signed)
Tcm with [facility] 8/26@ 8:30am per Isaac Laud

## 2015-07-31 NOTE — Progress Notes (Signed)
Patient Name: Joshua [name] Date of Encounter: 07/31/2015  Primary Cardiologist: Dr. Gwenlyn Found   Principal Problem:   Chest pain Active Problems:   Hyperlipidemia   Essential hypertension   Presence of stent in anterior descending branch of left coronary artery   Uncontrolled diabetes mellitus with peripheral autonomic neuropathy   Morbid obesity   CAD in native artery   Sleep apnea- on C-pap   Coronary artery disease involving native coronary artery of native heart without angina pectoris   Angina pectoris   Acute diastolic heart failure    SUBJECTIVE  Denies any CP or SOB last night.   CURRENT MEDS . allopurinol  300 mg Oral Daily  . amLODipine  10 mg Oral Daily  . aspirin EC  81 mg Oral Daily  . doxazosin  8 mg Oral QHS  . furosemide  40 mg Oral Daily  . insulin aspart  0-20 Units Subcutaneous TID WC  . insulin aspart  0-5 Units Subcutaneous QHS  . isosorbide mononitrate  30 mg Oral Daily  . metoprolol succinate  100 mg Oral Daily  . rosuvastatin  40 mg Oral q1800  . sodium chloride  3 mL Intravenous Q12H  . spironolactone  12.5 mg Oral Daily  . ticagrelor  90 mg Oral BID    OBJECTIVE  Filed Vitals:   07/30/15 1600 07/30/15 1601 07/30/15 2013 07/31/15 0437  BP: 118/61 114/55 150/73 162/85  Pulse: 63 65 71 65  Temp:   98.1 F (36.7 C) 97.9 F (36.6 C)  TempSrc:   Oral Oral  Resp: 16 19    Height:      Weight:    295 lb 8 oz (134.038 kg)  SpO2: 98% 97% 97% 98%    Intake/Output Summary (Last 24 hours) at 07/31/15 0838 Last data filed at 07/30/15 1754  Gross per 24 hour  Intake      0 ml  Output    550 ml  Net   -550 ml   Filed Weights   07/29/15 0701 07/31/15 0437  Weight: 295 lb 12.8 oz (134.174 kg) 295 lb 8 oz (134.038 kg)    PHYSICAL EXAM  General: Pleasant, NAD. Neuro: Alert and oriented X 3. Moves all extremities spontaneously. Psych: Normal affect. HEENT:  Normal  Neck: Supple without bruits or JVD. Lungs:  Resp regular and unlabored,  CTA. Heart: RRR no s3, s4, or murmurs. R radial cath site stable. Abdomen: Soft, non-tender, non-distended, BS + x 4.  Extremities: No clubbing, cyanosis or edema. DP/PT/Radials 2+ and equal bilaterally.  Accessory Clinical Findings  CBC  Recent Labs  07/29/15 0259  07/30/15 0303 07/31/15 0517  WBC 8.9  --  5.2 5.1  NEUTROABS 6.9  --   --   --   HGB 12.9*  < > 12.8* 12.4*  HCT 38.8*  < > 39.1 37.1*  MCV 85.8  --  85.9 85.1  PLT 185  --  175 175  < > = values in this interval not displayed. Basic Metabolic Panel  Recent Labs  07/30/15 0303 07/31/15 0517  NA 138 138  K 4.6 4.2  CL 109 107  CO2 22 24  GLUCOSE 258* 231*  BUN 20 17  CREATININE 1.30* 1.30*  CALCIUM 8.5* 8.6*    TELE NSR with occasional PACs    ECG  No new EKG  Echocardiogram  pending    Radiology/Studies  Dg Chest 2 View  07/29/2015   CLINICAL DATA:  Chronic shortness of breath. Generalized  chest pain and left neck pain. Initial encounter.  EXAM: CHEST  2 VIEW  COMPARISON:  Chest radiograph performed 10/28/2013  FINDINGS: The lungs are well-aerated. Vascular congestion is noted, with mildly increased interstitial markings, which may reflect mild interstitial edema, similar in appearance to 2014. There is no evidence of pleural effusion or pneumothorax.  The heart is borderline normal in size. No acute osseous abnormalities are seen.  IMPRESSION: Vascular congestion, with mildly increased interstitial markings, which may reflect mild interstitial edema, similar in appearance to 2014.   Electronically Signed   By: Garald Balding M.D.   On: 07/29/2015 02:39    ASSESSMENT AND PLAN  1. Chest pain  - originally planned for cath on 8/16, cath delayed given Cr 1.5 requiring further IV hydration. Hydrated overnight, Cr 1.3 this morning  - EKG poor R wave progression in anterior lead, otherwise no significant ST-T wave changes  - cath 07/30/2015 triple vessel CAD, 60% mid LCx stenosis unchanged from 2015,  patent mid RCA with diffuse 50% restenosis unchanged in appearance since 2015, FFR 0.9. Patent distal RCA stent with 10% diffuse stenosis. EF 45-50%, elevated LV filling pressure with LVEDP 20. Given diuresis with $RemoveBefor'40mg'[id number]$  IV lasix yesterday  - transitioned to $RemoveBeforeD'40mg'JAebTtIelemsjj$  PO lasix today. Echo pending for further assess EF. Likely discharge today if once echo is done. Question if need to continue lasix after going home, it is possible the elevated LVEDP was related to fluid hydration >24 hrs since his cath was delayed 1 more day. Expect to restart amlodipine-olmesartan on discharge.  2. CAD  - h/o anterolateral STEMI 10/2011, cath 99% mid LAD stenosis proximal to this there was a 60-70% stenosis after the first diagonal vessel. Lesion treated with 3.0x32 mm Promus DES. He also had 95% mid RCA stenosis treated in a staged fashion with 3.0x18 mm Resolute DES and 30% circumflex stenosis  - cath 2015 DES to distal RCA and LAD  3. HTN 4. HLD 5. DM 6. OSA on CPAP 7. CKD stage III: Cr 1.6 on arrival, one yr ago his Cr was 1.06-1.1, s/p IVF hydration, Cr 1.3 this morning  Signed, Almyra Deforest PA-C Pager: [phone]

## 2015-08-01 NOTE — Telephone Encounter (Signed)
Patient contacted regarding discharge from [facility] on 07/31/15.  Patient understands to follow up with provider Ignacia Bayley, NP on 08/08/15 at 8:30a at [facility]. Patient understands discharge instructions? yes Patient understands medications and regiment? yes Patient understands to bring all medications to this visit? yes

## 2015-08-04 DIAGNOSIS — E1121 Type 2 diabetes mellitus with diabetic nephropathy: Secondary | ICD-10-CM | POA: Diagnosis not present

## 2015-08-04 DIAGNOSIS — E11621 Type 2 diabetes mellitus with foot ulcer: Secondary | ICD-10-CM | POA: Diagnosis not present

## 2015-08-04 DIAGNOSIS — G4733 Obstructive sleep apnea (adult) (pediatric): Secondary | ICD-10-CM | POA: Diagnosis not present

## 2015-08-04 DIAGNOSIS — M869 Osteomyelitis, unspecified: Secondary | ICD-10-CM | POA: Diagnosis not present

## 2015-08-04 DIAGNOSIS — I252 Old myocardial infarction: Secondary | ICD-10-CM | POA: Diagnosis not present

## 2015-08-04 DIAGNOSIS — L97511 Non-pressure chronic ulcer of other part of right foot limited to breakdown of skin: Secondary | ICD-10-CM | POA: Diagnosis present

## 2015-08-04 DIAGNOSIS — M109 Gout, unspecified: Secondary | ICD-10-CM | POA: Diagnosis not present

## 2015-08-07 ENCOUNTER — Other Ambulatory Visit (INDEPENDENT_AMBULATORY_CARE_PROVIDER_SITE_OTHER): Payer: BLUE CROSS/BLUE SHIELD | Admitting: *Deleted

## 2015-08-07 ENCOUNTER — Other Ambulatory Visit: Payer: Self-pay | Admitting: Cardiovascular Disease

## 2015-08-07 DIAGNOSIS — E8770 Fluid overload, unspecified: Secondary | ICD-10-CM | POA: Diagnosis not present

## 2015-08-07 LAB — BASIC METABOLIC PANEL
BUN: 29 mg/dL — ABNORMAL HIGH (ref 6–23)
CO2: 28 mEq/L (ref 19–32)
Calcium: 9.6 mg/dL (ref 8.4–10.5)
Chloride: 104 mEq/L (ref 96–112)
Creatinine, Ser: 1.44 mg/dL (ref 0.40–1.50)
GFR: 65.05 mL/min (ref >60.00)
Glucose, Bld: 146 mg/dL — ABNORMAL HIGH (ref 70–99)
Potassium: 4 mEq/L (ref 3.5–5.1)
Sodium: 140 mEq/L (ref 135–145)

## 2015-08-08 ENCOUNTER — Encounter: Payer: Self-pay | Admitting: Nurse Practitioner

## 2015-08-08 ENCOUNTER — Ambulatory Visit (INDEPENDENT_AMBULATORY_CARE_PROVIDER_SITE_OTHER): Payer: BLUE CROSS/BLUE SHIELD | Admitting: Nurse Practitioner

## 2015-08-08 VITALS — BP 169/80 | HR 78 | Ht 69.0 in | Wt 303.0 lb

## 2015-08-08 DIAGNOSIS — I10 Essential (primary) hypertension: Secondary | ICD-10-CM

## 2015-08-08 DIAGNOSIS — I5042 Chronic combined systolic (congestive) and diastolic (congestive) heart failure: Secondary | ICD-10-CM

## 2015-08-08 DIAGNOSIS — E119 Type 2 diabetes mellitus without complications: Secondary | ICD-10-CM | POA: Diagnosis not present

## 2015-08-08 DIAGNOSIS — E785 Hyperlipidemia, unspecified: Secondary | ICD-10-CM | POA: Diagnosis not present

## 2015-08-08 DIAGNOSIS — I2510 Atherosclerotic heart disease of native coronary artery without angina pectoris: Secondary | ICD-10-CM | POA: Diagnosis not present

## 2015-08-08 DIAGNOSIS — I5032 Chronic diastolic (congestive) heart failure: Secondary | ICD-10-CM | POA: Insufficient documentation

## 2015-08-08 MED ORDER — ISOSORBIDE MONONITRATE ER 60 MG PO TB24: 60.0000 mg | ORAL_TABLET | Freq: Every day | ORAL | Status: DC

## 2015-08-08 NOTE — Patient Instructions (Signed)
Medication Instructions:  1. INCREASE IMDUR TO 60 MG DAILY; NEW RX SENT IN  FOR THE 60 MG TABLET  Labwork: NONE  Testing/Procedures: YOU WILL NEED A BLOOD PRESSURE CHECK TO BE DONE IN THE NEXT 1-2 WEEKS IN THE [facility] WITH SALLY EARL, PHARM D  Follow-Up: 2 MONTHS WITH DR. Claiborne Billings AT [facility]  Any Other Special Instructions Will Be Listed Below (If Applicable).

## 2015-08-08 NOTE — Progress Notes (Signed)
Patient Name: Joshua [name] Date of Encounter: 08/08/2015  Primary Care Provider:  [name], MD Primary Cardiologist:  Corky Downs, MD   Chief Complaint  59 year old male with history of CAD status post recent hospitalization secondary to unstable angina who presents for follow-up.  Past Medical History   Past Medical History  Diagnosis Date  . Essential hypertension   . Hyperlipemia   . GERD (gastroesophageal reflux disease)   . Polyneuropathy in diabetes(357.2)   . Coronary artery disease     a. 2012 s/p MI/PCI: s/p DES to mid/dist RCA and overlapping DES to LAD;  b. 07/2015 Cath: LAD 10% ISR, D1 40(jailed), LCX 40m, OM2 30, OM3 30, RCA 38m ISR, 10d ISR.  [name] Chronic combined systolic and diastolic CHF (congestive heart failure)     a. 07/2015 Echo: EF 45-50%, mod LVH, mid apical/antsept AK, Gr 1 DD.  [name] Heart murmur   . Myocardial infarction 2012  . OSA on CPAP   . Pulmonary sarcoidosis     "problems w/it years ago; no problems anymore" (07/03/2014)  . Type II diabetes mellitus   . Diabetic gastroparesis   . Gout   . Morbid obesity    Past Surgical History  Procedure Laterality Date  . Incision and drainage Right 10/28/2013    Procedure: INCISION AND DRAINAGE Right small finger;  Surgeon: [name], MD;  Location: [facility];  Service: Orthopedics;  Laterality: Right;  . I&d extremity Right 10/30/2013    Procedure: IRRIGATION AND DEBRIDEMENT RIGHT SMALL FINGER POSSIBLE SECONDARY WOUND CLOSURE;  Surgeon: [name], MD;  Location: [facility];  Service: Orthopedics;  Laterality: Right;  [name] is available after 4pm  . I&d extremity Right 11/01/2013    Procedure:  IRRIGATION AND DEBRIDEMENT RIGHT  PROXIMAL PHALANGEAL LEVEL AMPUTATION;  Surgeon: [name], MD;  Location: [facility];  Service: Orthopedics;  Laterality: Right;  . Doppler echocardiography N/A 11-23-2011    TECHNICALLY DIFFICULT. LV GROSSLY NORMAL IN SIZE. LV SYSTOLIC FUNCTION IS MILDLY  REDUCED. EF=45-50%. DOPPLER FLOW PATTERN WAS NORMAL FOR AGE. MILD  . Nuclear stress test N/A 12-29-2011    MILD PERFUSION DEFECT IS SEEN IN THE APICAL SEPTAL AMD MID INFEROSEPTAL REGIONS. THIS IS CONSISTANT WITH INFARCT/SCAR. NO EVIDENCE OF INDUCIBLE ISCHEMIA. EF 41%. LV SYSTOLIC FUNCTION IS MODERATELY REDUCED.   . Coronary angioplasty with stent placement  10/31/2011    30 % MID ATRIOVENTRICULAR GROOVE CX STENOSIS. 90 % MID RCA.PTA TO LAD/STENTING OF TANDEM 60-70%. LAD STENOSIS 99% REDUCED TO 0%.3.0 X 32MM PROMUS DES POSTDILATED TO 3.25MM.  [name] Coronary angioplasty with stent placement  07/03/2014    "2"  . Breast lumpectomy Right ~ 2000    "benign"  . Toe amputation Right ~ 2010    "2nd toe"  . Toe amputation Right 2012    "3rd toe"  . Cataract extraction w/ intraocular lens  implant, bilateral Bilateral 2014-2015  . Percutaneous coronary stent intervention (pci-s) Left 11/03/2011    Procedure: PERCUTANEOUS CORONARY STENT INTERVENTION (PCI-S);  Surgeon: [name], MD;  Location: [facility] CATH LAB;  Service: Cardiovascular;  Laterality: Left;  . Left heart cath Left 11/03/2011    Procedure: LEFT HEART CATH;  Surgeon: [name], MD;  Location: [facility] CATH LAB;  Service: Cardiovascular;  Laterality: Left;  . Left heart catheterization with coronary angiogram N/A 07/03/2014    Procedure: LEFT HEART CATHETERIZATION WITH CORONARY ANGIOGRAM;  Surgeon: [name], MD;  Location: [facility] CATH LAB;  Service: Cardiovascular;  Laterality: N/A;  . Cardiac catheterization N/A 07/30/2015    Procedure: Left Heart Cath and Coronary Angiography;  Surgeon: Burnell Blanks, MD;  Location: [facility] CV LAB;  Service: Cardiovascular;  Laterality: N/A;    Allergies  No Known Allergies  HPI  59 year old male with the above, please Promus. He has a history of coronary artery disease status post MI in 2012 with subsequent LAD and RCA stenting. He was recently admitted to [facility] with unstable angina  and underwent diagnostic catheterization revealing moderate nonobstructive disease with in the previously placed mid RCA stent. He had approximately 10% stenosis in the distal RCA stent and also proximal LAD stents. There were no targets for intervention. He did have some volume overload with elevated LVEDP and was given a dose of IV Lasix prior to discharge. Her bili, EF was 45-50% on echo that admission. Since his discharge, he has had intermittent retrosternal chest pressure. He also initially had some dyspnea on exertion but this is improving. He does not watch his sodium intake. He does not weigh himself daily. He does not check his blood sugars. He does report compliance with medications otherwise. He denies PND, orthopnea, dizziness, syncope, edema, or early satiety.  Home Medications  Prior to Admission medications   Medication Sig Start Date End Date Taking? Authorizing Provider  allopurinol (ZYLOPRIM) 300 MG tablet Take 300 mg by mouth daily.    Yes Historical Provider, MD  amLODipine-olmesartan (AZOR) 10-40 MG per tablet Take 1 tablet by mouth daily. 07/04/14  Yes Brett Canales, PA-C  Ascorbic Acid (VITAMIN C) 100 MG tablet Take 100 mg by mouth daily.   Yes Historical Provider, MD  aspirin EC 81 MG tablet Take 81 mg by mouth daily.   Yes Historical Provider, MD  doxazosin (CARDURA) 8 MG tablet Take 8 mg by mouth at bedtime. 03/21/15  Yes Historical Provider, MD  feeding supplement, GLUCERNA SHAKE, (GLUCERNA SHAKE) LIQD Take 237 mLs by mouth daily.   Yes Historical Provider, MD  furosemide (LASIX) 40 MG tablet Take 1 tablet (40 mg total) by mouth daily. 07/31/15  Yes Almyra Deforest, PA  insulin NPH-regular (HUMULIN 70/30) (70-30) 100 UNIT/ML injection Inject 40 Units into the skin 2 (two) times daily. 11/02/13  Yes Janece Canterbury, MD  isosorbide mononitrate (IMDUR) 60 MG 24 hr tablet Take 1 tablet (60 mg total) by mouth daily. 08/08/15  Yes Rogelia Mire, NP  metFORMIN (GLUCOPHAGE) 500 MG tablet  Take 1 tablet (500 mg total) by mouth 2 (two) times daily with a meal. 07/04/14  Yes Brett Canales, PA-C  metoprolol succinate (TOPROL-XL) 100 MG 24 hr tablet Take 1 tablet (100 mg total) by mouth daily. Take with or immediately following a meal. 10/09/13  Yes [name], MD  Multiple Vitamin (MULTIVITAMIN) tablet Take 1 tablet by mouth daily.   Yes Historical Provider, MD  nitroGLYCERIN (NITROSTAT) 0.4 MG SL tablet Place 1 tablet (0.4 mg total) under the tongue every 5 (five) minutes as needed for chest pain. 10/19/13  Yes [name], MD  rosuvastatin (CRESTOR) 40 MG tablet Take 1 tablet (40 mg total) by mouth daily. 11/02/13  Yes Janece Canterbury, MD  spironolactone (ALDACTONE) 25 MG tablet Take 0.5 tablets (12.5 mg total) by mouth daily. 08/05/14  Yes [name], MD  zinc gluconate 50 MG tablet Take 50 mg by mouth daily.   Yes Historical Provider, MD  BRILINTA 90 MG TABS tablet TAKE 1 TABLET (90 MG TOTAL) BY MOUTH  2 (TWO) TIMES DAILY. 08/08/15   [name], MD    Review of Systems  Intermittent retrosternal chest pressure as above. His breathing has improved.  All other systems reviewed and are otherwise negative except as noted above.  Physical Exam  VS:  BP 169/80 mmHg  Pulse 78  Ht $R'5\' 9"'EP$  (1.753 m)  Wt 303 lb (137.44 kg)  BMI 44.72 kg/m2 , BMI Body mass index is 44.72 kg/(m^2). GEN: Well nourished, well developed, in no acute distress. HEENT: normal. Neck: Supple, obese, difficult to assess JVP, no carotid bruits, or masses. Cardiac: RRR, no murmurs, rubs, or gallops. No clubbing, cyanosis, edema.  Radials/DP/PT 2+ and equal bilaterally. Right wrist catheterization site without bleeding, bruit, or hematoma. Respiratory:  Respirations regular and unlabored, clear to auscultation bilaterally. GI: Soft, nontender, nondistended, BS + x 4. MS: no deformity or atrophy. Skin: warm and dry, no rash. Neuro:  Strength and sensation are intact. Psych: Normal affect.  Accessory  Clinical Findings  ECG - regular sinus rhythm, 78, poor R-wave progression, left atrial enlargement-no acute ST or T changes.  Assessment & Plan  1.  Coronary artery disease: Status post prior MI and RCA/LAD stenting. He underwent recent catheterization related to recurrent chest pain revealing moderate nonobstructive disease. He has been medically managed. He has had intermittent, mild chest pressure but this has been improving. He remains on aspirin, Brilinta, beta blocker, statin, and nitrate therapy. Given hypertension with intermittent chest pressure, I will increase his isosorbide mononitrate to 60 mg daily.  2. Chronic combined systolic and diastolic congestive heart failure/ischemic cardiomyopathy: EF 45-50% by recent echocardiogram with moderate LVH. Volume looks good today. He had labs yesterday showing stable renal function and potassium. He has not been weighing himself daily. He also does not watch his sodium intake.  We discussed the importance of daily weights, sodium restriction, medication compliance, and symptom reporting and he verbalizes understanding.  3. Essential hypertension: Blood pressure is elevated today. He does not check this at home. He says he has been hypertensive since age 49. He does not watch his sodium intake. As above, I am increasing isosorbide today. I will arrange for follow-up blood pressure check in 1-2 weeks and if pressures elevated at that time, I would recommend switching his Toprol-XL to carvedilol for better blood pressure control. He would likely tolerate 25 mg twice a day. He is otherwise on amlodipine-olmesartan, Cardura, spironolactone, and Lasix.  4. Hyperlipidemia: Continue high potency statin therapy.  5. Type 2 diabetes mellitus: He does not check his sugars routinely. He remains on metformin and insulin.  6. Morbid obesity: We discussed the importance of calorie restriction and regular activity with a goal of weight loss. He is not currently  committed to losing weight.  7. Disposition: Follow-up blood pressure check in 1-2 weeks. Follow-up with Dr. Claiborne Billings in 2-3 months.    Murray Hodgkins, NP 08/08/2015, 9:21 AM

## 2015-08-08 NOTE — Telephone Encounter (Signed)
REFILL 

## 2015-08-11 DIAGNOSIS — G4733 Obstructive sleep apnea (adult) (pediatric): Secondary | ICD-10-CM | POA: Diagnosis not present

## 2015-08-11 DIAGNOSIS — E11621 Type 2 diabetes mellitus with foot ulcer: Secondary | ICD-10-CM | POA: Diagnosis not present

## 2015-08-11 DIAGNOSIS — E1121 Type 2 diabetes mellitus with diabetic nephropathy: Secondary | ICD-10-CM | POA: Diagnosis not present

## 2015-08-11 DIAGNOSIS — L97511 Non-pressure chronic ulcer of other part of right foot limited to breakdown of skin: Secondary | ICD-10-CM | POA: Diagnosis not present

## 2015-08-14 ENCOUNTER — Telehealth: Payer: Self-pay | Admitting: Nurse Practitioner

## 2015-08-14 NOTE — Telephone Encounter (Signed)
New message    Pt was throwing up, very tired, headache and SOB yesterday  Pt c/o Shortness Of Breath: STAT if SOB developed within the last 24 hours or pt is noticeably SOB on the phone  1. Are you currently SOB (can you hear that pt is SOB on the phone)? Some SOB   2. How long have you been experiencing SOB? Yesterday  3. Are you SOB when sitting or when up moving around? Both   4. Are you currently experiencing any other symptoms? Some headache

## 2015-08-14 NOTE — Telephone Encounter (Signed)
The patient called in today with complaints of an episode of vomiting yesterday evening about 5/ 5:30 pm. He states he was brushing his teeth and felt nauseated doing this, then threw up. He works nights- he did go to work, but felt SOB/ nauseated/ and had a headache/ feelings of indigestion. He reports he did not have much of an appetite yesterday, so he did not have much on his stomach. He was encouraged to leave work early last night due to his symptoms. He reports he got up to leave and he had to run to the bathroom to vomit. He felt tired and had some minimal dizziness. He had a severe headache when he got home and took tylenol with some relief. He last saw Ignacia Bayley, NP on 08/08/15 and imdur was increased to 60 mg daily. The patient denies any real change in symptoms until last night. I inquired if he had a fever. He reports that his wife checked his temperature this morning and he was 99.9. He has had a sensation of some chills. He reports that his heart also felt like it was beating hard last night. I have advised the patient that at this time, his symptoms do sound viral in nature due to the fever. I explained that he may be somewhat dehydrated and this may be contributing to his headache and feeling that his heart is pounding.  I have advised him to rest, stay hydrated, tylenol as needed for headache and fever, and cracker/ rice/ bread/ bananas as tolerated. He will call back should his fever resolve other symptoms persist. If fever persists, he has been encouraged to contact his PCP for follow up/ advisement. He voices understanding.

## 2015-08-22 ENCOUNTER — Ambulatory Visit (INDEPENDENT_AMBULATORY_CARE_PROVIDER_SITE_OTHER): Payer: BLUE CROSS/BLUE SHIELD | Admitting: Pharmacist

## 2015-08-22 VITALS — BP 118/76 | Wt 292.5 lb

## 2015-08-22 DIAGNOSIS — I10 Essential (primary) hypertension: Secondary | ICD-10-CM | POA: Diagnosis not present

## 2015-08-22 NOTE — Progress Notes (Signed)
Patient ID:                  DOB:                          MRN:     HPI: Joshua [name] is a 59 y.o. male referred by Ignacia Bayley, NP to HTN clinic.  He has a PMH significant for CAD s/p recent hospitalization for unstable angina, CHF, DM, hyperlipidemia, HTN, and obesity.  At his post hospital follow up on 8/26, his blood pressure was elevated at 169/80.  His isosorbide was increased at that time and appt made for follow up today.  He does not check his BP at home.  He did say that the wound center mentioned it was <696 systolic earlier this week.   Since that time, he called on 9/1 to report an episode of vomiting, SOB, headache.  It was thought to be just a virus and patient has improved.  He has continued to have some SOB on exertion but he thinks it may be related to his weight and difficulty walking with a boot on his foot.   He is hopeful to have the boot removed next week.   Current HTN meds: Amlodipine/olmesartan 10/40mg  daily Doxazosin 8mg  daily Isosorbide mononitrate 60mg  daily Metoprolol succinate 100mg  daily Spironolactone 12.5mg  daily Furosemide 40mg  daily  BP goal: <140/80 mmHg   Wt Readings from Last 3 Encounters:  08/22/15 292 lb 8 oz (132.677 kg)  08/08/15 303 lb (137.44 kg)  07/31/15 295 lb 8 oz (134.038 kg)   BP Readings from Last 3 Encounters:  08/22/15 118/76  08/08/15 169/80  07/31/15 139/76   Pulse Readings from Last 3 Encounters:  08/08/15 78  07/31/15 70  03/28/15 78    Renal function: Estimated Creatinine Clearance: 77.4 mL/min (by C-G formula based on Cr of 1.44).  Past Medical History  Diagnosis Date  . Essential hypertension   . Hyperlipemia   . GERD (gastroesophageal reflux disease)   . Polyneuropathy in diabetes(357.2)   . Coronary artery disease     a. 2012 s/p MI/PCI: s/p DES to mid/dist RCA and overlapping DES to LAD;  b. 07/2015 Cath: LAD 10% ISR, D1 40(jailed), LCX 31m, OM2 30, OM3 30, RCA 46m ISR, 10d ISR.  [name] Chronic combined  systolic and diastolic CHF (congestive heart failure)     a. 07/2015 Echo: EF 45-50%, mod LVH, mid apical/antsept AK, Gr 1 DD.  [name] Heart murmur   . Myocardial infarction 2012  . OSA on CPAP   . Pulmonary sarcoidosis     "problems w/it years ago; no problems anymore" (07/03/2014)  . Type II diabetes mellitus   . Diabetic gastroparesis   . Gout   . Morbid obesity     Current Outpatient Prescriptions on File Prior to Visit  Medication Sig Dispense Refill  . allopurinol (ZYLOPRIM) 300 MG tablet Take 300 mg by mouth daily.     [name] amLODipine-olmesartan (AZOR) 10-40 MG per tablet Take 1 tablet by mouth daily. 30 tablet 5  . Ascorbic Acid (VITAMIN C) 100 MG tablet Take 100 mg by mouth daily.    [name] aspirin EC 81 MG tablet Take 81 mg by mouth daily.    [name] BRILINTA 90 MG TABS tablet TAKE 1 TABLET (90 MG TOTAL) BY MOUTH 2 (TWO) TIMES DAILY. 60 tablet 7  . doxazosin (CARDURA) 8 MG tablet Take 8 mg by mouth at bedtime.  5  .  feeding supplement, GLUCERNA SHAKE, (GLUCERNA SHAKE) LIQD Take 237 mLs by mouth daily.    . furosemide (LASIX) 40 MG tablet Take 1 tablet (40 mg total) by mouth daily. 30 tablet 3  . insulin NPH-regular (HUMULIN 70/30) (70-30) 100 UNIT/ML injection Inject 40 Units into the skin 2 (two) times daily. 10 mL 12  . isosorbide mononitrate (IMDUR) 60 MG 24 hr tablet Take 1 tablet (60 mg total) by mouth daily. 30 tablet 11  . metFORMIN (GLUCOPHAGE) 500 MG tablet Take 1 tablet (500 mg total) by mouth 2 (two) times daily with a meal.    . metoprolol succinate (TOPROL-XL) 100 MG 24 hr tablet Take 1 tablet (100 mg total) by mouth daily. Take with or immediately following a meal. 30 tablet 6  . Multiple Vitamin (MULTIVITAMIN) tablet Take 1 tablet by mouth daily.    . nitroGLYCERIN (NITROSTAT) 0.4 MG SL tablet Place 1 tablet (0.4 mg total) under the tongue every 5 (five) minutes as needed for chest pain. 25 tablet 3  . rosuvastatin (CRESTOR) 40 MG tablet Take 1 tablet (40 mg total) by mouth daily.      [name] spironolactone (ALDACTONE) 25 MG tablet Take 0.5 tablets (12.5 mg total) by mouth daily. 15 tablet 11  . zinc gluconate 50 MG tablet Take 50 mg by mouth daily.     No current facility-administered medications on file prior to visit.    No Known Allergies   Assessment/Plan:  1. Hypertension- Pt's BP much improved today.  Of note, there was quite a difference between his two arms (140/72 in left arm and 118/76 on right arm).  Both are still at goal.  He is tolerating medications with no issues.  BMET was checked on 8/25 and K and Scr stable.  Will continue current therapy at this time.  He has a follow up appointment scheduled with Dr. Claiborne Billings in November.

## 2015-08-25 ENCOUNTER — Encounter (HOSPITAL_BASED_OUTPATIENT_CLINIC_OR_DEPARTMENT_OTHER): Payer: BLUE CROSS/BLUE SHIELD | Attending: Plastic Surgery

## 2015-08-25 DIAGNOSIS — L97511 Non-pressure chronic ulcer of other part of right foot limited to breakdown of skin: Secondary | ICD-10-CM | POA: Diagnosis not present

## 2015-08-25 DIAGNOSIS — E11621 Type 2 diabetes mellitus with foot ulcer: Secondary | ICD-10-CM | POA: Insufficient documentation

## 2015-08-25 DIAGNOSIS — M109 Gout, unspecified: Secondary | ICD-10-CM | POA: Diagnosis not present

## 2015-08-25 DIAGNOSIS — I252 Old myocardial infarction: Secondary | ICD-10-CM | POA: Diagnosis not present

## 2015-08-25 DIAGNOSIS — E1140 Type 2 diabetes mellitus with diabetic neuropathy, unspecified: Secondary | ICD-10-CM | POA: Diagnosis not present

## 2015-08-25 DIAGNOSIS — I2510 Atherosclerotic heart disease of native coronary artery without angina pectoris: Secondary | ICD-10-CM | POA: Diagnosis not present

## 2015-08-25 DIAGNOSIS — G4730 Sleep apnea, unspecified: Secondary | ICD-10-CM | POA: Insufficient documentation

## 2015-09-02 ENCOUNTER — Encounter: Payer: Self-pay | Admitting: Cardiovascular Disease

## 2015-09-02 ENCOUNTER — Ambulatory Visit (INDEPENDENT_AMBULATORY_CARE_PROVIDER_SITE_OTHER): Payer: BLUE CROSS/BLUE SHIELD | Admitting: Cardiovascular Disease

## 2015-09-02 VITALS — BP 144/88 | HR 85 | Ht 69.0 in | Wt 294.1 lb

## 2015-09-02 DIAGNOSIS — E785 Hyperlipidemia, unspecified: Secondary | ICD-10-CM

## 2015-09-02 DIAGNOSIS — G4730 Sleep apnea, unspecified: Secondary | ICD-10-CM

## 2015-09-02 DIAGNOSIS — I2510 Atherosclerotic heart disease of native coronary artery without angina pectoris: Secondary | ICD-10-CM

## 2015-09-02 DIAGNOSIS — K219 Gastro-esophageal reflux disease without esophagitis: Secondary | ICD-10-CM

## 2015-09-02 DIAGNOSIS — R5383 Other fatigue: Secondary | ICD-10-CM | POA: Diagnosis not present

## 2015-09-02 DIAGNOSIS — I25810 Atherosclerosis of coronary artery bypass graft(s) without angina pectoris: Secondary | ICD-10-CM | POA: Diagnosis not present

## 2015-09-02 DIAGNOSIS — I5042 Chronic combined systolic (congestive) and diastolic (congestive) heart failure: Secondary | ICD-10-CM

## 2015-09-02 DIAGNOSIS — E119 Type 2 diabetes mellitus without complications: Secondary | ICD-10-CM | POA: Diagnosis not present

## 2015-09-02 MED ORDER — PANTOPRAZOLE SODIUM 40 MG PO TBEC: 40.0000 mg | DELAYED_RELEASE_TABLET | Freq: Every day | ORAL | Status: DC

## 2015-09-02 NOTE — Patient Instructions (Signed)
Your physician recommends that you return for lab work fasting.  Your physician has recommended you make the following change in your medication: start new medication for pantoprazole. This has been sent to your pharmacy.  Your physician wants you to follow-up in: 6 months or sooner if needed. You will receive a reminder letter in the mail two months in advance. If you don't receive a letter, please call our office to schedule the follow-up appointment.

## 2015-09-02 NOTE — Progress Notes (Signed)
Patient ID: Joshua [name], male   DOB: 1956/05/13, 59 y.o.   MRN: [id number]     HPI: Joshua [name] is a 59 y.o. male who presents to the office for follow-up cardiology evaluation.  I last saw him over 2 years ago.  He was recently hospitalized in August 2016.  He presents for posthospital evaluation.  Joshua [name] suffered an acute coronary syndrome on 10/31/2011 and presented with ECG findings of anterolateral ST segment elevation. Cardiac catheterization revealed a 99% mid LAD stenosis and proximal to this there was a 60-70% stenosis after the first diagonal vessel. He also had 90-95% mid RCA stenosis and 30% circumflex stenosis. He underwent initial acute intervention to his LAD and a 3.0x32 mm Promus DES stent was inserted. 3 days later he underwent successful staged PCI to his 95% RCA stenosis and a 3.0x18 mm Resolute DES stent was inserted and post dilated 3.5 mm.   Additional problems include at least a >30 year history of hypertension, a 25 year history of diabetes mellitus, obesity, as well as hyperlipidemia.  He recently was admitted to the hospital on 07/29/2015 with complaints of shortness of breath simmers to his initial symptomatology.  He had mild renal insufficiency and was hydrated and ultimately underwent cardiac catheterization which was done by Dr. Kinnie Scales in 07/30/2015.  His LAD stents were patent.  There was mild 40% first agonal stenosis, 60% circumflex stenosis, and his RCA stents had a 50% intimal narrowing in its mid stent without significant narrowing in the distal stent.  An echo Doppler study showed an EF of 45-50%.  On 07/31/2015 with moderate left ventricular hypertrophy and grade 1 diastolic dysfunction.  He ultimately was discharged.  He was seen by Jorja Loa on 08/08/2015 for initial posthospital evaluation.  Since that time, he has had some vague symptoms of GERD.  He is not on any proton pump inhibitor.  He gets shortness of breath with minimal activity  including brushing his teeth and taking a shower.  He has not been routinely exercising.  He has obstructive sleep apnea and uses CPAP therapy.  He denies residual snoring.  He works at the [employer] in the night shift.  At times he feels that some of his food may get stuck when swallowing but this is not consistent.  He presents for evaluation  Past Medical History  Diagnosis Date  . Essential hypertension   . Hyperlipemia   . GERD (gastroesophageal reflux disease)   . Polyneuropathy in diabetes(357.2)   . Coronary artery disease     a. 2012 s/p MI/PCI: s/p DES to mid/dist RCA and overlapping DES to LAD;  b. 07/2015 Cath: LAD 10% ISR, D1 40(jailed), LCX 4m OM2 30, OM3 30, RCA 531mSR, 10d ISR.  . [name] Kitchenhronic combined systolic and diastolic CHF (congestive heart failure)     a. 07/2015 Echo: EF 45-50%, mod LVH, mid apical/antsept AK, Gr 1 DD.  . [name] Kitcheneart murmur   . Myocardial infarction 2012  . OSA on CPAP   . Pulmonary sarcoidosis     "problems w/it years ago; no problems anymore" (07/03/2014)  . Type II diabetes mellitus   . Diabetic gastroparesis   . Gout   . Morbid obesity     Past Surgical History  Procedure Laterality Date  . Incision and drainage Right 10/28/2013    Procedure: INCISION AND DRAINAGE Right small finger;  Surgeon: MaSchuyler AmorMD;  Location: [facility];  Service: Orthopedics;  Laterality: Right;  .  I&d extremity Right 10/30/2013    Procedure: IRRIGATION AND DEBRIDEMENT RIGHT SMALL FINGER POSSIBLE SECONDARY WOUND CLOSURE;  Surgeon: [name], MD;  Location: [facility];  Service: Orthopedics;  Laterality: Right;  Joshua [name] is available after 4pm  . I&d extremity Right 11/01/2013    Procedure:  IRRIGATION AND DEBRIDEMENT RIGHT  PROXIMAL PHALANGEAL LEVEL AMPUTATION;  Surgeon: [name], MD;  Location: [facility];  Service: Orthopedics;  Laterality: Right;  . Doppler echocardiography N/A 11-23-2011    TECHNICALLY DIFFICULT. LV GROSSLY NORMAL IN  SIZE. LV SYSTOLIC FUNCTION IS MILDLY REDUCED. EF=45-50%. DOPPLER FLOW PATTERN WAS NORMAL FOR AGE. MILD  . Nuclear stress test N/A 12-29-2011    MILD PERFUSION DEFECT IS SEEN IN THE APICAL SEPTAL AMD MID INFEROSEPTAL REGIONS. THIS IS CONSISTANT WITH INFARCT/SCAR. NO EVIDENCE OF INDUCIBLE ISCHEMIA. EF 41%. LV SYSTOLIC FUNCTION IS MODERATELY REDUCED.   . Coronary angioplasty with stent placement  10/31/2011    30 % MID ATRIOVENTRICULAR GROOVE CX STENOSIS. 90 % MID RCA.PTA TO LAD/STENTING OF TANDEM 60-70%. LAD STENOSIS 99% REDUCED TO 0%.3.0 X 32MM PROMUS DES POSTDILATED TO 3.25MM.  [name] Coronary angioplasty with stent placement  07/03/2014    "2"  . Breast lumpectomy Right ~ 2000    "benign"  . Toe amputation Right ~ 2010    "2nd toe"  . Toe amputation Right 2012    "3rd toe"  . Cataract extraction w/ intraocular lens  implant, bilateral Bilateral 2014-2015  . Percutaneous coronary stent intervention (pci-s) Left 11/03/2011    Procedure: PERCUTANEOUS CORONARY STENT INTERVENTION (PCI-S);  Surgeon: [name], MD;  Location: [facility] CATH LAB;  Service: Cardiovascular;  Laterality: Left;  . Left heart cath Left 11/03/2011    Procedure: LEFT HEART CATH;  Surgeon: [name], MD;  Location: [facility] CATH LAB;  Service: Cardiovascular;  Laterality: Left;  . Left heart catheterization with coronary angiogram N/A 07/03/2014    Procedure: LEFT HEART CATHETERIZATION WITH CORONARY ANGIOGRAM;  Surgeon: [name], MD;  Location: [facility] CATH LAB;  Service: Cardiovascular;  Laterality: N/A;  . Cardiac catheterization N/A 07/30/2015    Procedure: Left Heart Cath and Coronary Angiography;  Surgeon: Burnell Blanks, MD;  Location: [facility];  Service: Cardiovascular;  Laterality: N/A;    No Known Allergies  Current Outpatient Prescriptions  Medication Sig Dispense Refill  . allopurinol (ZYLOPRIM) 300 MG tablet Take 300 mg by mouth daily.     [name] amLODipine-olmesartan (AZOR) 10-40 MG per tablet Take 1  tablet by mouth daily. 30 tablet 5  . Ascorbic Acid (VITAMIN C) 100 MG tablet Take 100 mg by mouth daily.    [name] aspirin EC 81 MG tablet Take 81 mg by mouth daily.    [name] BRILINTA 90 MG TABS tablet TAKE 1 TABLET (90 MG TOTAL) BY MOUTH 2 (TWO) TIMES DAILY. 60 tablet 7  . doxazosin (CARDURA) 8 MG tablet Take 8 mg by mouth at bedtime.  5  . feeding supplement, GLUCERNA SHAKE, (GLUCERNA SHAKE) LIQD Take 237 mLs by mouth daily.    . furosemide (LASIX) 40 MG tablet Take 1 tablet (40 mg total) by mouth daily. 30 tablet 3  . insulin NPH-regular (HUMULIN 70/30) (70-30) 100 UNIT/ML injection Inject 40 Units into the skin 2 (two) times daily. 10 mL 12  . isosorbide mononitrate (IMDUR) 60 MG 24 hr tablet Take 1 tablet (60 mg total) by mouth daily. 30 tablet 11  . metFORMIN (GLUCOPHAGE) 1000 MG tablet Take 1,000 mg by mouth  2 (two) times daily.  2  . metoprolol succinate (TOPROL-XL) 100 MG 24 hr tablet Take 1 tablet (100 mg total) by mouth daily. Take with or immediately following a meal. 30 tablet 6  . Multiple Vitamin (MULTIVITAMIN) tablet Take 1 tablet by mouth daily.    . nitroGLYCERIN (NITROSTAT) 0.4 MG SL tablet Place 1 tablet (0.4 mg total) under the tongue every 5 (five) minutes as needed for chest pain. 25 tablet 3  . rosuvastatin (CRESTOR) 40 MG tablet Take 1 tablet (40 mg total) by mouth daily.    [name] spironolactone (ALDACTONE) 25 MG tablet Take 0.5 tablets (12.5 mg total) by mouth daily. 15 tablet 11  . zinc gluconate 50 MG tablet Take 50 mg by mouth daily.    . pantoprazole (PROTONIX) 40 MG tablet Take 1 tablet (40 mg total) by mouth daily. 30 tablet 11   No current facility-administered medications for this visit.    Social History   Social History  . Marital Status: Married    Spouse Name: N/A  . Number of Children: N/A  . Years of Education: N/A   Occupational History  . Not on file.   Social History Main Topics  . Smoking status: Never Smoker   . Smokeless tobacco: Never Used  .  Alcohol Use: Yes     Comment: Pt sts he binge drinks on the weekends  . Drug Use: No  . Sexual Activity: Yes   Other Topics Concern  . Not on file   Social History Narrative    Family History  Problem Relation Age of Onset  . Heart attack Maternal Aunt   . Diabetes Maternal Grandmother   . Hypertension Maternal Grandmother     ROS General: Negative; No fevers, chills, or night sweats;  HEENT: Negative; No changes in vision or hearing, sinus congestion, difficulty swallowing Pulmonary: Negative; No cough, wheezing, shortness of breath, hemoptysis Cardiovascular: Negative; No chest pain, presyncope, syncope, palpitations GI: Negative; No nausea, vomiting, diarrhea, or abdominal pain GU: Negative; No dysuria, hematuria, or difficulty voiding Musculoskeletal: Negative; no myalgias, joint pain, or weakness Hematologic/Oncology: Negative; no easy bruising, bleeding Endocrine: Negative; no heat/cold intolerance; no diabetes Neuro: Negative; no changes in balance, headaches Skin: Negative; No rashes or skin lesions Psychiatric: Negative; No behavioral problems, depression Sleep: Positive for obstructive sleep apnea on CPAP therapy.  No residual snoring, daytime sleepiness, hypersomnolence, bruxism, restless legs, hypnogognic hallucinations, no cataplexy Other comprehensive 14 point system review is negative.   PE BP 144/88 mmHg  Pulse 85  Ht '5\' 9"'  (1.753 m)  Wt 294 lb 1.6 oz (133.403 kg)  BMI 43.41 kg/m2  Body mass index concordant with morbid obesity. Wt Readings from Last 3 Encounters:  09/02/15 294 lb 1.6 oz (133.403 kg)  08/22/15 292 lb 8 oz (132.677 kg)  08/08/15 303 lb (137.44 kg)   General: Alert, oriented, no distress.  Skin: normal turgor, no rashes HEENT: Normocephalic, atraumatic. Pupils round and reactive; sclera anicteric;no lid lag.  Nose without nasal septal hypertrophy Mouth/Parynx benign; Mallinpatti scale 3/4 Neck: Very thick neck; No JVD, no carotid  bruits with normal carotid upstroke Lungs: clear to ausculatation and percussion; no wheezing or rales Chest wall: Nontender to palpation Heart: RRR, s1 s2 normal 1/6 systolic murmur, unchanged.  No S3 gallop.  No rubs thrills or heaves Abdomen: Moderate central adiposity. soft, nontender; no hepatosplenomehaly, BS+; abdominal aorta nontender and not dilated by palpation. Back: No CVA tenderness Pulses 2+ Extremities: no clubbing cyanosis or edema, Homan's sign  negative  Neurologic: grossly nonfocal Psychologic: normal affect and mood.  ECG (independently read by me): Sinus rhythm at 85 bpm.  Early repolarization changes anterolaterally.  Prior 2014 ECG: Normal sinus rhythm at 90 beats per minute. Poor progression V1 through V4. Normal intervals.  LABS: BMP Latest Ref Rng 08/07/2015 07/31/2015 07/30/2015  Glucose 70 - 99 mg/dL 146(H) 231(H) 258(H)  BUN 6 - 23 mg/dL 29(H) 17 20  Creatinine 0.40 - 1.50 mg/dL 1.44 1.30(H) 1.30(H)  Sodium 135 - 145 mEq/L 140 138 138  Potassium 3.5 - 5.1 mEq/L 4.0 4.2 4.6  Chloride 96 - 112 mEq/L 104 107 109  CO2 19 - 32 mEq/L '28 24 22  ' Calcium 8.4 - 10.5 mg/dL 9.6 8.6(L) 8.5(L)   Hepatic Function Latest Ref Rng 07/03/2014 10/30/2013 10/09/2013  Total Protein 6.0 - 8.3 g/dL 6.6 6.9 7.3  Albumin 3.5 - 5.2 g/dL 2.8(L) 2.5(L) 4.3  AST 0 - 37 U/L '23 10 11  ' ALT 0 - 53 U/L '21 9 10  ' Alk Phosphatase 39 - 117 U/L 120(H) 109 92  Total Bilirubin 0.3 - 1.2 mg/dL <0.2(L) 0.2(L) 0.3   CBC Latest Ref Rng 07/31/2015 07/30/2015 07/29/2015  WBC 4.0 - 10.5 K/uL 5.1 5.2 -  Hemoglobin 13.0 - 17.0 g/dL 12.4(L) 12.8(L) 12.6(L)  Hematocrit 39.0 - 52.0 % 37.1(L) 39.1 37.0(L)  Platelets 150 - 400 K/uL 175 175 -   Lab Results  Component Value Date   MCV 85.1 07/31/2015   MCV 85.9 07/30/2015   MCV 85.8 07/29/2015   Lab Results  Component Value Date   TSH 0.593 10/30/2013   Lab Results  Component Value Date   HGBA1C 10.0* 07/03/2014   Lipid Panel     Component Value  Date/Time   CHOL 215* 07/03/2014 1035   TRIG 127 07/03/2014 1035   HDL 90 07/03/2014 1035   CHOLHDL 2.4 07/03/2014 1035   VLDL 25 07/03/2014 1035   LDLCALC 100* 07/03/2014 1035      RADIOLOGY: Dg Chest 2 View  10/28/2013   CLINICAL DATA:  Shortness of breath  EXAM: CHEST  2 VIEW  COMPARISON:  10/31/2011  FINDINGS: Mild cardiomegaly, stable from prior. Kerley A and B lines noted. No significant effusion. No asymmetric airspace opacity. No acute osseous findings.  IMPRESSION: Mild interstitial pulmonary edema.   Electronically Signed   By: Jorje Guild M.D.   On: 10/28/2013 23:01   Dg Hand Complete Right  10/28/2013   CLINICAL DATA:  Little finger laceration with pain  EXAM: RIGHT HAND - COMPLETE 3+ VIEW  COMPARISON:  None.  FINDINGS: There is extensive soft tissue swelling of the 5th digit, extending into the dorsal hand. No radiodense foreign body. No osseous erosion or asymmetric joint narrowing to suggest osseous/joint infection. No acute fracture or malalignment.  Remote ulnar and radial styloid process fractures.  Degenerative interphalangeal joint narrowing throughout the digits.  IMPRESSION: No evidence of osseous infection.  No radiodense foreign body.   Electronically Signed   By: Jorje Guild M.D.   On: 10/28/2013 23:04      ASSESSMENT AND PLAN: Joshua [name] is a 59  years old African-American male who suffered an acute coronary syndrome and anterolateral ST segment elevation myocardial infarction in November 2012. At that time he underwent stenting to his subtotaled LAD stenosis and 3 days later underwent staged intervention to a high grade mid right coronary artery stenosis. A nuclear perfusion study in January 2013 was low risk and did show small area of  anteroseptal scar towards the apex without associated ischemia.  He recently presented with increasing shortness of breath and worrisome symptoms for unstable angina.  I reviewed his hospitalization as well as his  catheterization findings.  His LAD stent is widely patent.  There is mild 50% intimal hyperplasia in the mid RCA stent with a patent distal stent.  He was recommended.  He has reduced LV function on echo Doppler study of 45-50% with moderate left trig hypertrophy and grade 1 diastolic dysfunction.  He is morbidly obese.  We discussed the importance of weight loss and exercise.  He is having symptoms suggestive of GERD and I will start him on Protonix 40 mg daily.  If he continues to have difficulty and particularly with the swallowing issues.  He may need GI evaluation to make certain he is not developed an esophageal stricture.  I am checking laboratory in the fasting state; adjustments to his medical therapy will be made if necessary.  I will see him in 6 months for reevaluation.  Time spent: 30 minutes  [name], MD, [facility]  09/02/2015 6:30 PM

## 2015-09-16 ENCOUNTER — Ambulatory Visit: Payer: Self-pay | Admitting: Cardiovascular Disease

## 2015-09-25 ENCOUNTER — Encounter (HOSPITAL_BASED_OUTPATIENT_CLINIC_OR_DEPARTMENT_OTHER): Payer: BLUE CROSS/BLUE SHIELD | Attending: Internal Medicine

## 2015-09-25 ENCOUNTER — Other Ambulatory Visit: Payer: Self-pay | Admitting: Cardiovascular Disease

## 2015-09-25 DIAGNOSIS — L84 Corns and callosities: Secondary | ICD-10-CM | POA: Insufficient documentation

## 2015-09-25 DIAGNOSIS — F418 Other specified anxiety disorders: Secondary | ICD-10-CM | POA: Insufficient documentation

## 2015-09-25 DIAGNOSIS — I2510 Atherosclerotic heart disease of native coronary artery without angina pectoris: Secondary | ICD-10-CM | POA: Insufficient documentation

## 2015-09-25 DIAGNOSIS — I70203 Unspecified atherosclerosis of native arteries of extremities, bilateral legs: Secondary | ICD-10-CM | POA: Insufficient documentation

## 2015-09-25 DIAGNOSIS — E1140 Type 2 diabetes mellitus with diabetic neuropathy, unspecified: Secondary | ICD-10-CM | POA: Diagnosis not present

## 2015-09-25 DIAGNOSIS — E11621 Type 2 diabetes mellitus with foot ulcer: Secondary | ICD-10-CM | POA: Diagnosis present

## 2015-09-25 DIAGNOSIS — M109 Gout, unspecified: Secondary | ICD-10-CM | POA: Diagnosis not present

## 2015-09-25 DIAGNOSIS — G4730 Sleep apnea, unspecified: Secondary | ICD-10-CM | POA: Diagnosis not present

## 2015-09-25 DIAGNOSIS — I252 Old myocardial infarction: Secondary | ICD-10-CM | POA: Diagnosis not present

## 2015-09-25 DIAGNOSIS — L97512 Non-pressure chronic ulcer of other part of right foot with fat layer exposed: Secondary | ICD-10-CM | POA: Insufficient documentation

## 2015-09-25 NOTE — Telephone Encounter (Signed)
Rx(s) sent to pharmacy electronically.  

## 2015-10-02 DIAGNOSIS — L97512 Non-pressure chronic ulcer of other part of right foot with fat layer exposed: Secondary | ICD-10-CM | POA: Diagnosis not present

## 2015-10-02 DIAGNOSIS — G4730 Sleep apnea, unspecified: Secondary | ICD-10-CM | POA: Diagnosis not present

## 2015-10-02 DIAGNOSIS — E11621 Type 2 diabetes mellitus with foot ulcer: Secondary | ICD-10-CM | POA: Diagnosis not present

## 2015-10-02 DIAGNOSIS — I70203 Unspecified atherosclerosis of native arteries of extremities, bilateral legs: Secondary | ICD-10-CM | POA: Diagnosis not present

## 2015-10-09 ENCOUNTER — Other Ambulatory Visit: Payer: Self-pay | Admitting: Internal Medicine

## 2015-10-09 ENCOUNTER — Ambulatory Visit (HOSPITAL_COMMUNITY)
Admission: RE | Admit: 2015-10-09 | Discharge: 2015-10-09 | Disposition: A | Discharge status: Home / Self Care | Payer: BLUE CROSS/BLUE SHIELD | Source: Ambulatory Visit | Attending: Internal Medicine | Admitting: Internal Medicine

## 2015-10-09 DIAGNOSIS — E119 Type 2 diabetes mellitus without complications: Secondary | ICD-10-CM | POA: Diagnosis not present

## 2015-10-09 DIAGNOSIS — L97512 Non-pressure chronic ulcer of other part of right foot with fat layer exposed: Secondary | ICD-10-CM | POA: Diagnosis not present

## 2015-10-09 DIAGNOSIS — M869 Osteomyelitis, unspecified: Secondary | ICD-10-CM

## 2015-10-09 DIAGNOSIS — Z89421 Acquired absence of other right toe(s): Secondary | ICD-10-CM | POA: Insufficient documentation

## 2015-10-09 DIAGNOSIS — I70203 Unspecified atherosclerosis of native arteries of extremities, bilateral legs: Secondary | ICD-10-CM | POA: Diagnosis not present

## 2015-10-09 DIAGNOSIS — G4730 Sleep apnea, unspecified: Secondary | ICD-10-CM | POA: Diagnosis not present

## 2015-10-09 DIAGNOSIS — E11621 Type 2 diabetes mellitus with foot ulcer: Secondary | ICD-10-CM | POA: Diagnosis not present

## 2015-10-13 LAB — HEMOGLOBIN A1C
Hgb A1c MFr Bld: 14.2 % — ABNORMAL HIGH (ref <5.7)
Mean Plasma Glucose: 361 mg/dL — ABNORMAL HIGH (ref <117)

## 2015-10-14 ENCOUNTER — Telehealth: Payer: Self-pay | Admitting: Cardiovascular Disease

## 2015-10-14 ENCOUNTER — Ambulatory Visit (INDEPENDENT_AMBULATORY_CARE_PROVIDER_SITE_OTHER): Payer: BLUE CROSS/BLUE SHIELD | Admitting: Cardiovascular Disease

## 2015-10-14 ENCOUNTER — Encounter: Payer: Self-pay | Admitting: Cardiovascular Disease

## 2015-10-14 VITALS — BP 160/92 | HR 77 | Ht 69.0 in | Wt 279.9 lb

## 2015-10-14 DIAGNOSIS — IMO0002 Reserved for concepts with insufficient information to code with codable children: Secondary | ICD-10-CM

## 2015-10-14 DIAGNOSIS — E1143 Type 2 diabetes mellitus with diabetic autonomic (poly)neuropathy: Secondary | ICD-10-CM | POA: Diagnosis not present

## 2015-10-14 DIAGNOSIS — G4730 Sleep apnea, unspecified: Secondary | ICD-10-CM

## 2015-10-14 DIAGNOSIS — E1165 Type 2 diabetes mellitus with hyperglycemia: Secondary | ICD-10-CM

## 2015-10-14 DIAGNOSIS — I25810 Atherosclerosis of coronary artery bypass graft(s) without angina pectoris: Secondary | ICD-10-CM

## 2015-10-14 DIAGNOSIS — E109 Type 1 diabetes mellitus without complications: Secondary | ICD-10-CM

## 2015-10-14 DIAGNOSIS — I10 Essential (primary) hypertension: Secondary | ICD-10-CM

## 2015-10-14 DIAGNOSIS — I2510 Atherosclerotic heart disease of native coronary artery without angina pectoris: Secondary | ICD-10-CM

## 2015-10-14 DIAGNOSIS — K219 Gastro-esophageal reflux disease without esophagitis: Secondary | ICD-10-CM

## 2015-10-14 DIAGNOSIS — E785 Hyperlipidemia, unspecified: Secondary | ICD-10-CM

## 2015-10-14 LAB — COMPREHENSIVE METABOLIC PANEL
ALT: 11 U/L (ref 9–46)
AST: 10 U/L (ref 10–35)
Albumin: 3.6 g/dL (ref 3.6–5.1)
Alkaline Phosphatase: 112 U/L (ref 40–115)
BUN: 27 mg/dL — ABNORMAL HIGH (ref 7–25)
CO2: 24 mmol/L (ref 20–31)
Calcium: 9.2 mg/dL (ref 8.6–10.3)
Chloride: 92 mmol/L — ABNORMAL LOW (ref 98–110)
Creat: 1.84 mg/dL — ABNORMAL HIGH (ref 0.70–1.33)
Glucose, Bld: 669 mg/dL (ref 65–99)
Potassium: 4.9 mmol/L (ref 3.5–5.3)
Sodium: 129 mmol/L — ABNORMAL LOW (ref 135–146)
Total Bilirubin: 0.3 mg/dL (ref 0.2–1.2)
Total Protein: 7.1 g/dL (ref 6.1–8.1)

## 2015-10-14 LAB — LIPID PANEL
Cholesterol: 155 mg/dL (ref 125–200)
HDL: 67 mg/dL (ref >=40)
LDL Cholesterol: 61 mg/dL (ref <130)
Total CHOL/HDL Ratio: 2.3 Ratio (ref <=5.0)
Triglycerides: 137 mg/dL (ref <150)
VLDL: 27 mg/dL (ref <30)

## 2015-10-14 LAB — TSH: TSH: 1.295 u[IU]/mL (ref 0.350–4.500)

## 2015-10-14 MED ORDER — HYDRALAZINE HCL 25 MG PO TABS: 25.0000 mg | ORAL_TABLET | Freq: Two times a day (BID) | ORAL | Status: DC

## 2015-10-14 NOTE — Telephone Encounter (Signed)
Hilda Blades is calling to report critical labs ..reference #[id number]

## 2015-10-14 NOTE — Patient Instructions (Signed)
Your physician has recommended you make the following change in your medication: STOP the spironalactone. This has been replaced with hydralazine 25 mg and has been sent to your pharmacy.   You have been referred to Dr. Chalmers Cater.  Your physician recommends that you schedule a follow-up appointment in: 4 months with Dr. Claiborne Billings.

## 2015-10-14 NOTE — Telephone Encounter (Signed)
Received a call from [place] with [facility] calling to report glucose [phone].Patient currently in office to see Dr.Kelly.[name] stated she has lab.

## 2015-10-15 ENCOUNTER — Encounter: Payer: Self-pay | Admitting: Cardiovascular Disease

## 2015-10-15 NOTE — Progress Notes (Signed)
Patient ID: Joshua [name], male   DOB: Sep 11, 1956, 59 y.o.   MRN: [id number]     HPI: Joshua [name] is a 59 y.o. male who presents to the office for follow-up cardiology evaluation.  After not having seen him in over 2 years, he presents for two-month follow-up evaluation.  [name] suffered an acute coronary syndrome on 10/31/2011 and presented with ECG findings of anterolateral ST segment elevation. Cardiac catheterization revealed a 99% mid LAD stenosis and proximal to this there was a 60-70% stenosis after the first diagonal vessel. He also had 90-95% mid RCA stenosis and 30% circumflex stenosis. He underwent initial acute intervention to his LAD and a 3.0x32 mm Promus DES stent was inserted. 3 days later he underwent successful staged PCI to his 95% RCA stenosis and a 3.0x18 mm Resolute DES stent was inserted and post dilated 3.5 mm.   Additional problems include at least a >30 year history of hypertension, a 25 year history of diabetes mellitus, obesity, as well as hyperlipidemia.  He was admitted to the hospital on 07/29/2015 with complaints of shortness of breath simmers to his initial symptomatology.  He had mild renal insufficiency, was hydrated and underwent cardiac catheterization by Dr. Julianne Handler in 07/30/2015.  His LAD stents were patent.  There was mild 40% first agonal stenosis, 60% circumflex stenosis, and his RCA stents had a 50% intimal narrowing in its mid stent without significant narrowing in the distal stent.  An echo Doppler study showed an EF of 45-50%.  On 07/31/2015 with moderate left ventricular hypertrophy and grade 1 diastolic dysfunction.  He ultimately was discharged.  He was seen by Joshua [name] on 08/08/2015 for initial posthospital evaluation.  Since that time, he has had some vague symptoms of GERD.  He is not on any proton pump inhibitor.  He gets shortness of breath with minimal activity including brushing his teeth and taking a shower.  He has not been routinely  exercising.  He has obstructive sleep apnea and uses CPAP therapy.  He denies residual snoring.  He works at the [employer] in the night shift.  At times he feels that some of his food may get stuck when swallowing but this is not consistent.    When I saw him 2 months ago, I started him on Protonix for significant GERD symptoms which has improved.  We discussed weight loss and exercise.  I recommended laboratory be obtained.  He had blood work done yesterday which showed worsening renal function with a creatinine of 1.84, significant glucose elevation at 669 with probable factitious hyponatremia at 129, due to high glucose levels.  His hemoglobin A1c was 14.2. He presents for evaluation.   Past Medical History  Diagnosis Date  . Essential hypertension   . Hyperlipemia   . GERD (gastroesophageal reflux disease)   . Polyneuropathy in diabetes(357.2)   . Coronary artery disease     a. 2012 s/p MI/PCI: s/p DES to mid/dist RCA and overlapping DES to LAD;  b. 07/2015 Cath: LAD 10% ISR, D1 40(jailed), LCX 82m OM2 30, OM3 30, RCA 511mSR, 10d ISR.  . [name] Kitchenhronic combined systolic and diastolic CHF (congestive heart failure) ([facility]    a. 07/2015 Echo: EF 45-50%, mod LVH, mid apical/antsept AK, Gr 1 DD.  . [name] Kitcheneart murmur   . Myocardial infarction ([facility]  . OSA on CPAP   . Pulmonary sarcoidosis ([facility]    "problems w/it years ago; no problems anymore" (07/03/2014)  .  Type II diabetes mellitus ([place])   . Diabetic gastroparesis ([place])   . Gout   . Morbid obesity [facility])     Past Surgical History  Procedure Laterality Date  . Incision and drainage Right 10/28/2013    Procedure: INCISION AND DRAINAGE Right small finger;  Surgeon: [name], MD;  Location: [facility];  Service: Orthopedics;  Laterality: Right;  . I&d extremity Right 10/30/2013    Procedure: IRRIGATION AND DEBRIDEMENT RIGHT SMALL FINGER POSSIBLE SECONDARY WOUND CLOSURE;  Surgeon: [name], MD;  Location: [facility];   Service: Orthopedics;  Laterality: Right;  Joshua [name] is available after 4pm  . I&d extremity Right 11/01/2013    Procedure:  IRRIGATION AND DEBRIDEMENT RIGHT  PROXIMAL PHALANGEAL LEVEL AMPUTATION;  Surgeon: [name], MD;  Location: [facility];  Service: Orthopedics;  Laterality: Right;  . Doppler echocardiography N/A 11-23-2011    TECHNICALLY DIFFICULT. LV GROSSLY NORMAL IN SIZE. LV SYSTOLIC FUNCTION IS MILDLY REDUCED. EF=45-50%. DOPPLER FLOW PATTERN WAS NORMAL FOR AGE. MILD  . Nuclear stress test N/A 12-29-2011    MILD PERFUSION DEFECT IS SEEN IN THE APICAL SEPTAL AMD MID INFEROSEPTAL REGIONS. THIS IS CONSISTANT WITH INFARCT/SCAR. NO EVIDENCE OF INDUCIBLE ISCHEMIA. EF 41%. LV SYSTOLIC FUNCTION IS MODERATELY REDUCED.   . Coronary angioplasty with stent placement  10/31/2011    30 % MID ATRIOVENTRICULAR GROOVE CX STENOSIS. 90 % MID RCA.PTA TO LAD/STENTING OF TANDEM 60-70%. LAD STENOSIS 99% REDUCED TO 0%.3.0 X 32MM PROMUS DES POSTDILATED TO 3.25MM.  [name] Coronary angioplasty with stent placement  07/03/2014    "2"  . Breast lumpectomy Right ~ 2000    "benign"  . Toe amputation Right ~ 2010    "2nd toe"  . Toe amputation Right 2012    "3rd toe"  . Cataract extraction w/ intraocular lens  implant, bilateral Bilateral 2014-2015  . Percutaneous coronary stent intervention (pci-s) Left 11/03/2011    Procedure: PERCUTANEOUS CORONARY STENT INTERVENTION (PCI-S);  Surgeon: [name], MD;  Location: [facility] CATH LAB;  Service: Cardiovascular;  Laterality: Left;  . Left heart cath Left 11/03/2011    Procedure: LEFT HEART CATH;  Surgeon: [name], MD;  Location: [facility] CATH LAB;  Service: Cardiovascular;  Laterality: Left;  . Left heart catheterization with coronary angiogram N/A 07/03/2014    Procedure: LEFT HEART CATHETERIZATION WITH CORONARY ANGIOGRAM;  Surgeon: [name], MD;  Location: [facility] CATH LAB;  Service: Cardiovascular;  Laterality: N/A;  . Cardiac catheterization N/A 07/30/2015     Procedure: Left Heart Cath and Coronary Angiography;  Surgeon: Burnell Blanks, MD;  Location: [place] CV LAB;  Service: Cardiovascular;  Laterality: N/A;    No Known Allergies  Current Outpatient Prescriptions  Medication Sig Dispense Refill  . allopurinol (ZYLOPRIM) 300 MG tablet Take 300 mg by mouth daily.     [name] amLODipine-olmesartan (AZOR) 10-40 MG per tablet Take 1 tablet by mouth daily. 30 tablet 5  . aspirin EC 81 MG tablet Take 81 mg by mouth daily.    [name] BRILINTA 90 MG TABS tablet TAKE 1 TABLET (90 MG TOTAL) BY MOUTH 2 (TWO) TIMES DAILY. 60 tablet 7  . doxazosin (CARDURA) 8 MG tablet Take 8 mg by mouth at bedtime.  5  . furosemide (LASIX) 40 MG tablet Take 1 tablet (40 mg total) by mouth daily. 30 tablet 3  . insulin NPH-regular (HUMULIN 70/30) (70-30) 100 UNIT/ML injection Inject 40 Units into the skin 2 (two) times daily. 10 mL 12  .  isosorbide mononitrate (IMDUR) 60 MG 24 hr tablet Take 1 tablet (60 mg total) by mouth daily. 30 tablet 11  . metFORMIN (GLUCOPHAGE) 1000 MG tablet Take 1,000 mg by mouth 2 (two) times daily.  2  . metoprolol succinate (TOPROL-XL) 100 MG 24 hr tablet Take 1 tablet (100 mg total) by mouth daily. Take with or immediately following a meal. 30 tablet 6  . nitroGLYCERIN (NITROSTAT) 0.4 MG SL tablet Place 1 tablet (0.4 mg total) under the tongue every 5 (five) minutes as needed for chest pain. 25 tablet 3  . pantoprazole (PROTONIX) 40 MG tablet Take 1 tablet (40 mg total) by mouth daily. 30 tablet 11  . rosuvastatin (CRESTOR) 40 MG tablet Take 1 tablet (40 mg total) by mouth daily.    . hydrALAZINE (APRESOLINE) 25 MG tablet Take 1 tablet (25 mg total) by mouth 2 (two) times daily. 60 tablet 6   No current facility-administered medications for this visit.    Social History   Social History  . Marital Status: Married    Spouse Name: N/A  . Number of Children: N/A  . Years of Education: N/A   Occupational History  . Not on file.   Social  History Main Topics  . Smoking status: Never Smoker   . Smokeless tobacco: Never Used  . Alcohol Use: Yes     Comment: Pt sts he binge drinks on the weekends  . Drug Use: No  . Sexual Activity: Yes   Other Topics Concern  . Not on file   Social History Narrative    Family History  Problem Relation Age of Onset  . Heart attack Maternal Aunt   . Diabetes Maternal Grandmother   . Hypertension Maternal Grandmother     ROS General: Negative; No fevers, chills, or night sweats;  HEENT: Negative; No changes in vision or hearing, sinus congestion, difficulty swallowing Pulmonary: Negative; No cough, wheezing, shortness of breath, hemoptysis Cardiovascular: Negative; No chest pain, presyncope, syncope, palpitations GI: Negative; No nausea, vomiting, diarrhea, or abdominal pain GU: Negative; No dysuria, hematuria, or difficulty voiding Musculoskeletal: Negative; no myalgias, joint pain, or weakness Hematologic/Oncology: Negative; no easy bruising, bleeding Endocrine: Negative; no heat/cold intolerance; no diabetes Neuro: Negative; no changes in balance, headaches Skin: Negative; No rashes or skin lesions Psychiatric: Negative; No behavioral problems, depression Sleep: Positive for obstructive sleep apnea on CPAP therapy.  No residual snoring, daytime sleepiness, hypersomnolence, bruxism, restless legs, hypnogognic hallucinations, no cataplexy Other comprehensive 14 point system review is negative.   PE BP 160/92 mmHg  Pulse 77  Ht '5\' 9"'  (1.753 m)  Wt 279 lb 14.4 oz (126.962 kg)  BMI 41.32 kg/m2  Body mass index concordant with morbid obesity. Wt Readings from Last 3 Encounters:  10/14/15 279 lb 14.4 oz (126.962 kg)  09/02/15 294 lb 1.6 oz (133.403 kg)  08/22/15 292 lb 8 oz (132.677 kg)   General: Alert, oriented, no distress.  Skin: normal turgor, no rashes HEENT: Normocephalic, atraumatic. Pupils round and reactive; sclera anicteric;no lid lag.  Nose without nasal septal  hypertrophy Mouth/Parynx benign; Mallinpatti scale 3/4 Neck: Very thick neck; No JVD, no carotid bruits with normal carotid upstroke Lungs: clear to ausculatation and percussion; no wheezing or rales Chest wall: Nontender to palpation Heart: RRR, s1 s2 normal 1/6 systolic murmur, unchanged.  No S3 gallop.  No rubs thrills or heaves Abdomen: Moderate central adiposity. soft, nontender; no hepatosplenomehaly, BS+; abdominal aorta nontender and not dilated by palpation. Back: No CVA tenderness Pulses 2+  Extremities: no clubbing cyanosis or edema, Homan's sign negative  Neurologic: grossly nonfocal Psychologic: normal affect and mood.  ECG (independently read by me):  Normal sinus rhythm at 77 bpm.  Poor R wave progression anteriorly.  QTc interval 425 ms. Borderline first-degree AV block.  ECG (independently read by me): Sinus rhythm at 85 bpm.  Early repolarization changes anterolaterally.  Prior 2014 ECG: Normal sinus rhythm at 90 beats per minute. Poor progression V1 through V4. Normal intervals.  LABS: BMP Latest Ref Rng 10/13/2015 08/07/2015 07/31/2015  Glucose 65 - 99 mg/dL 669(HH) 146(H) 231(H)  BUN 7 - 25 mg/dL 27(H) 29(H) 17  Creatinine 0.70 - 1.33 mg/dL 1.84(H) 1.44 1.30(H)  Sodium 135 - 146 mmol/L 129(L) 140 138  Potassium 3.5 - 5.3 mmol/L 4.9 4.0 4.2  Chloride 98 - 110 mmol/L 92(L) 104 107  CO2 20 - 31 mmol/L '24 28 24  ' Calcium 8.6 - 10.3 mg/dL 9.2 9.6 8.6(L)   Hepatic Function Latest Ref Rng 10/13/2015 07/03/2014 10/30/2013  Total Protein 6.1 - 8.1 g/dL 7.1 6.6 6.9  Albumin 3.6 - 5.1 g/dL 3.6 2.8(L) 2.5(L)  AST 10 - 35 U/L '10 23 10  ' ALT 9 - 46 U/L '11 21 9  ' Alk Phosphatase 40 - 115 U/L 112 120(H) 109  Total Bilirubin 0.2 - 1.2 mg/dL 0.3 <0.2(L) 0.2(L)   CBC Latest Ref Rng 07/31/2015 07/30/2015 07/29/2015  WBC 4.0 - 10.5 K/uL 5.1 5.2 -  Hemoglobin 13.0 - 17.0 g/dL 12.4(L) 12.8(L) 12.6(L)  Hematocrit 39.0 - 52.0 % 37.1(L) 39.1 37.0(L)  Platelets 150 - 400 K/uL 175 175 -    Lab Results  Component Value Date   MCV 85.1 07/31/2015   MCV 85.9 07/30/2015   MCV 85.8 07/29/2015   Lab Results  Component Value Date   TSH 1.295 10/13/2015   Lab Results  Component Value Date   HGBA1C 14.2* 10/13/2015   Lipid Panel     Component Value Date/Time   CHOL 155 10/13/2015 1201   TRIG 137 10/13/2015 1201   HDL 67 10/13/2015 1201   CHOLHDL 2.3 10/13/2015 1201   VLDL 27 10/13/2015 1201   LDLCALC 61 10/13/2015 1201      RADIOLOGY: Dg Chest 2 View  10/28/2013   CLINICAL DATA:  Shortness of breath  EXAM: CHEST  2 VIEW  COMPARISON:  10/31/2011  FINDINGS: Mild cardiomegaly, stable from prior. Kerley A and B lines noted. No significant effusion. No asymmetric airspace opacity. No acute osseous findings.  IMPRESSION: Mild interstitial pulmonary edema.   Electronically Signed   By: Jorje Guild M.D.   On: 10/28/2013 23:01   Dg Hand Complete Right  10/28/2013   CLINICAL DATA:  Little finger laceration with pain  EXAM: RIGHT HAND - COMPLETE 3+ VIEW  COMPARISON:  None.  FINDINGS: There is extensive soft tissue swelling of the 5th digit, extending into the dorsal hand. No radiodense foreign body. No osseous erosion or asymmetric joint narrowing to suggest osseous/joint infection. No acute fracture or malalignment.  Remote ulnar and radial styloid process fractures.  Degenerative interphalangeal joint narrowing throughout the digits.  IMPRESSION: No evidence of osseous infection.  No radiodense foreign body.   Electronically Signed   By: Jorje Guild M.D.   On: 10/28/2013 23:04      ASSESSMENT AND PLAN: Joshua [name] is a 59 year old African-American male who suffered an acute coronary syndrome and anterolateral ST segment elevation myocardial infarction in November 2012. At that time he underwent stenting to his  subtotaled LAD stenosis and 3 days later underwent staged intervention to a high grade mid right coronary artery stenosis. A nuclear perfusion study in  January 2013 was low risk and did show small area of anteroseptal scar towards the apex without associated ischemia.  He recently presented with increasing shortness of breath and worrisome symptoms for unstable angina.  I reviewed his hospitalization as well as his catheterization findings.  His LAD stent is widely patent.  There is mild 50% intimal hyperplasia in the mid RCA stent with a patent distal stent.  He was recommended.  He has reduced LV function on echo Doppler study of 45-50% with moderate left trig hypertrophy and grade 1 diastolic dysfunction.   He continues to be morbidly obese with a BMI of 41.32. When I last saw him he was having significant indigestion-like symptoms.  These have resolved with institution of Protonix 40 mg daily. I reviewed his lab work from 10/13/2015.  With his increasing creatinine at 1.84.  I'm recommending that he discontinue Spironalactone.  I am adding hydralazine 25 g twice a day for more optimal blood pressure control and he will continue to take his current dose of Toprol-XL 100 mg, isosorbide 60 mg, furosemide 40 mg, doxazocin8 mg and Azor 10/40 for blood pressure control.  I long discussion with him regarding his diabetes which is very poorly controlled.  In the past he had seen Dr. Redmond Pulling for endocrinology but has not seen her in some time.  I will refer him to her as soon as possible, hopefully this week or next particularly with his marked glucose elevation noted yesterday and hemoglobin A1c of 14.2.  I suspect his serum sodium is factitiously low due to the marked elevation of his serum glucose.  I will see him in 4 months for evaluation  Time spent: 30 minutes  [name], MD, [facility]  10/15/2015 1:09 PM

## 2015-10-16 ENCOUNTER — Encounter (HOSPITAL_BASED_OUTPATIENT_CLINIC_OR_DEPARTMENT_OTHER): Payer: BLUE CROSS/BLUE SHIELD | Attending: Internal Medicine

## 2015-10-16 DIAGNOSIS — M869 Osteomyelitis, unspecified: Secondary | ICD-10-CM | POA: Diagnosis not present

## 2015-10-16 DIAGNOSIS — G4730 Sleep apnea, unspecified: Secondary | ICD-10-CM | POA: Diagnosis not present

## 2015-10-16 DIAGNOSIS — M109 Gout, unspecified: Secondary | ICD-10-CM | POA: Insufficient documentation

## 2015-10-16 DIAGNOSIS — E1169 Type 2 diabetes mellitus with other specified complication: Secondary | ICD-10-CM | POA: Diagnosis not present

## 2015-10-16 DIAGNOSIS — E1140 Type 2 diabetes mellitus with diabetic neuropathy, unspecified: Secondary | ICD-10-CM | POA: Diagnosis not present

## 2015-10-16 DIAGNOSIS — L97512 Non-pressure chronic ulcer of other part of right foot with fat layer exposed: Secondary | ICD-10-CM | POA: Diagnosis not present

## 2015-10-16 DIAGNOSIS — I252 Old myocardial infarction: Secondary | ICD-10-CM | POA: Insufficient documentation

## 2015-10-16 DIAGNOSIS — E11621 Type 2 diabetes mellitus with foot ulcer: Secondary | ICD-10-CM | POA: Diagnosis not present

## 2015-10-16 DIAGNOSIS — E1136 Type 2 diabetes mellitus with diabetic cataract: Secondary | ICD-10-CM | POA: Insufficient documentation

## 2015-10-16 DIAGNOSIS — I2510 Atherosclerotic heart disease of native coronary artery without angina pectoris: Secondary | ICD-10-CM | POA: Insufficient documentation

## 2015-10-16 LAB — GLUCOSE, CAPILLARY: Glucose-Capillary: 167 mg/dL — ABNORMAL HIGH (ref 65–99)

## 2015-10-23 DIAGNOSIS — E11621 Type 2 diabetes mellitus with foot ulcer: Secondary | ICD-10-CM | POA: Diagnosis not present

## 2015-10-23 DIAGNOSIS — I252 Old myocardial infarction: Secondary | ICD-10-CM | POA: Diagnosis not present

## 2015-10-23 DIAGNOSIS — L97512 Non-pressure chronic ulcer of other part of right foot with fat layer exposed: Secondary | ICD-10-CM | POA: Diagnosis not present

## 2015-10-23 DIAGNOSIS — E1136 Type 2 diabetes mellitus with diabetic cataract: Secondary | ICD-10-CM | POA: Diagnosis not present

## 2015-10-30 DIAGNOSIS — L97512 Non-pressure chronic ulcer of other part of right foot with fat layer exposed: Secondary | ICD-10-CM | POA: Diagnosis not present

## 2015-10-30 DIAGNOSIS — E11621 Type 2 diabetes mellitus with foot ulcer: Secondary | ICD-10-CM | POA: Diagnosis not present

## 2015-10-30 DIAGNOSIS — I252 Old myocardial infarction: Secondary | ICD-10-CM | POA: Diagnosis not present

## 2015-10-30 DIAGNOSIS — E1136 Type 2 diabetes mellitus with diabetic cataract: Secondary | ICD-10-CM | POA: Diagnosis not present

## 2015-11-13 ENCOUNTER — Encounter (HOSPITAL_BASED_OUTPATIENT_CLINIC_OR_DEPARTMENT_OTHER): Payer: BLUE CROSS/BLUE SHIELD | Attending: Internal Medicine

## 2015-11-13 DIAGNOSIS — I2510 Atherosclerotic heart disease of native coronary artery without angina pectoris: Secondary | ICD-10-CM | POA: Insufficient documentation

## 2015-11-13 DIAGNOSIS — G4730 Sleep apnea, unspecified: Secondary | ICD-10-CM | POA: Diagnosis not present

## 2015-11-13 DIAGNOSIS — E11621 Type 2 diabetes mellitus with foot ulcer: Secondary | ICD-10-CM | POA: Insufficient documentation

## 2015-11-13 DIAGNOSIS — I252 Old myocardial infarction: Secondary | ICD-10-CM | POA: Insufficient documentation

## 2015-11-13 DIAGNOSIS — L97511 Non-pressure chronic ulcer of other part of right foot limited to breakdown of skin: Secondary | ICD-10-CM | POA: Diagnosis not present

## 2015-11-13 DIAGNOSIS — M109 Gout, unspecified: Secondary | ICD-10-CM | POA: Diagnosis not present

## 2015-11-13 DIAGNOSIS — E1140 Type 2 diabetes mellitus with diabetic neuropathy, unspecified: Secondary | ICD-10-CM | POA: Diagnosis not present

## 2015-11-13 DIAGNOSIS — M86672 Other chronic osteomyelitis, left ankle and foot: Secondary | ICD-10-CM | POA: Insufficient documentation

## 2015-11-20 DIAGNOSIS — G4730 Sleep apnea, unspecified: Secondary | ICD-10-CM | POA: Diagnosis not present

## 2015-11-20 DIAGNOSIS — M86672 Other chronic osteomyelitis, left ankle and foot: Secondary | ICD-10-CM | POA: Diagnosis not present

## 2015-11-20 DIAGNOSIS — L97511 Non-pressure chronic ulcer of other part of right foot limited to breakdown of skin: Secondary | ICD-10-CM | POA: Diagnosis not present

## 2015-11-20 DIAGNOSIS — E11621 Type 2 diabetes mellitus with foot ulcer: Secondary | ICD-10-CM | POA: Diagnosis not present

## 2015-11-20 LAB — GLUCOSE, CAPILLARY: Glucose-Capillary: 100 mg/dL — ABNORMAL HIGH (ref 65–99)

## 2015-11-24 DIAGNOSIS — L97511 Non-pressure chronic ulcer of other part of right foot limited to breakdown of skin: Secondary | ICD-10-CM | POA: Diagnosis not present

## 2015-11-24 DIAGNOSIS — E11621 Type 2 diabetes mellitus with foot ulcer: Secondary | ICD-10-CM | POA: Diagnosis not present

## 2015-11-24 DIAGNOSIS — G4730 Sleep apnea, unspecified: Secondary | ICD-10-CM | POA: Diagnosis not present

## 2015-11-24 DIAGNOSIS — M86672 Other chronic osteomyelitis, left ankle and foot: Secondary | ICD-10-CM | POA: Diagnosis not present

## 2015-11-24 LAB — GLUCOSE, CAPILLARY: Glucose-Capillary: 140 mg/dL — ABNORMAL HIGH (ref 65–99)

## 2015-11-25 DIAGNOSIS — E11621 Type 2 diabetes mellitus with foot ulcer: Secondary | ICD-10-CM | POA: Diagnosis not present

## 2015-11-25 DIAGNOSIS — L97511 Non-pressure chronic ulcer of other part of right foot limited to breakdown of skin: Secondary | ICD-10-CM | POA: Diagnosis not present

## 2015-11-25 DIAGNOSIS — G4730 Sleep apnea, unspecified: Secondary | ICD-10-CM | POA: Diagnosis not present

## 2015-11-25 DIAGNOSIS — M86672 Other chronic osteomyelitis, left ankle and foot: Secondary | ICD-10-CM | POA: Diagnosis not present

## 2015-11-25 LAB — GLUCOSE, CAPILLARY
Glucose-Capillary: 102 mg/dL — ABNORMAL HIGH (ref 65–99)
Glucose-Capillary: 147 mg/dL — ABNORMAL HIGH (ref 65–99)

## 2015-11-26 ENCOUNTER — Other Ambulatory Visit: Payer: Self-pay | Admitting: Internal Medicine

## 2015-11-26 DIAGNOSIS — E11621 Type 2 diabetes mellitus with foot ulcer: Secondary | ICD-10-CM | POA: Diagnosis not present

## 2015-11-27 DIAGNOSIS — L97511 Non-pressure chronic ulcer of other part of right foot limited to breakdown of skin: Secondary | ICD-10-CM | POA: Diagnosis not present

## 2015-11-27 DIAGNOSIS — E11621 Type 2 diabetes mellitus with foot ulcer: Secondary | ICD-10-CM | POA: Diagnosis not present

## 2015-11-27 DIAGNOSIS — M86672 Other chronic osteomyelitis, left ankle and foot: Secondary | ICD-10-CM | POA: Diagnosis not present

## 2015-11-27 DIAGNOSIS — G4730 Sleep apnea, unspecified: Secondary | ICD-10-CM | POA: Diagnosis not present

## 2015-11-27 LAB — GLUCOSE, CAPILLARY
Glucose-Capillary: 129 mg/dL — ABNORMAL HIGH (ref 65–99)
Glucose-Capillary: 169 mg/dL — ABNORMAL HIGH (ref 65–99)

## 2015-11-28 DIAGNOSIS — M86672 Other chronic osteomyelitis, left ankle and foot: Secondary | ICD-10-CM | POA: Diagnosis not present

## 2015-11-28 DIAGNOSIS — G4730 Sleep apnea, unspecified: Secondary | ICD-10-CM | POA: Diagnosis not present

## 2015-11-28 DIAGNOSIS — L97511 Non-pressure chronic ulcer of other part of right foot limited to breakdown of skin: Secondary | ICD-10-CM | POA: Diagnosis not present

## 2015-11-28 DIAGNOSIS — E11621 Type 2 diabetes mellitus with foot ulcer: Secondary | ICD-10-CM | POA: Diagnosis not present

## 2015-11-28 LAB — GLUCOSE, CAPILLARY
Glucose-Capillary: 220 mg/dL — ABNORMAL HIGH (ref 65–99)
Glucose-Capillary: 262 mg/dL — ABNORMAL HIGH (ref 65–99)
Glucose-Capillary: 110 mg/dL — ABNORMAL HIGH (ref 65–99)
Glucose-Capillary: 83 mg/dL (ref 65–99)
Glucose-Capillary: 89 mg/dL (ref 65–99)
Glucose-Capillary: 151 mg/dL — ABNORMAL HIGH (ref 65–99)

## 2015-12-01 DIAGNOSIS — M86672 Other chronic osteomyelitis, left ankle and foot: Secondary | ICD-10-CM | POA: Diagnosis not present

## 2015-12-01 DIAGNOSIS — G4730 Sleep apnea, unspecified: Secondary | ICD-10-CM | POA: Diagnosis not present

## 2015-12-01 DIAGNOSIS — L97511 Non-pressure chronic ulcer of other part of right foot limited to breakdown of skin: Secondary | ICD-10-CM | POA: Diagnosis not present

## 2015-12-01 DIAGNOSIS — E11621 Type 2 diabetes mellitus with foot ulcer: Secondary | ICD-10-CM | POA: Diagnosis not present

## 2015-12-01 LAB — GLUCOSE, CAPILLARY
Glucose-Capillary: 462 mg/dL — ABNORMAL HIGH (ref 65–99)
Glucose-Capillary: 452 mg/dL — ABNORMAL HIGH (ref 65–99)

## 2015-12-02 DIAGNOSIS — L97511 Non-pressure chronic ulcer of other part of right foot limited to breakdown of skin: Secondary | ICD-10-CM | POA: Diagnosis not present

## 2015-12-02 DIAGNOSIS — G4730 Sleep apnea, unspecified: Secondary | ICD-10-CM | POA: Diagnosis not present

## 2015-12-02 DIAGNOSIS — M86672 Other chronic osteomyelitis, left ankle and foot: Secondary | ICD-10-CM | POA: Diagnosis not present

## 2015-12-02 DIAGNOSIS — E11621 Type 2 diabetes mellitus with foot ulcer: Secondary | ICD-10-CM | POA: Diagnosis not present

## 2015-12-03 DIAGNOSIS — E11621 Type 2 diabetes mellitus with foot ulcer: Secondary | ICD-10-CM | POA: Diagnosis not present

## 2015-12-03 LAB — GLUCOSE, CAPILLARY
Glucose-Capillary: 123 mg/dL — ABNORMAL HIGH (ref 65–99)
Glucose-Capillary: 180 mg/dL — ABNORMAL HIGH (ref 65–99)

## 2015-12-04 DIAGNOSIS — M86672 Other chronic osteomyelitis, left ankle and foot: Secondary | ICD-10-CM | POA: Diagnosis not present

## 2015-12-04 DIAGNOSIS — L97511 Non-pressure chronic ulcer of other part of right foot limited to breakdown of skin: Secondary | ICD-10-CM | POA: Diagnosis not present

## 2015-12-04 DIAGNOSIS — E11621 Type 2 diabetes mellitus with foot ulcer: Secondary | ICD-10-CM | POA: Diagnosis not present

## 2015-12-04 DIAGNOSIS — G4730 Sleep apnea, unspecified: Secondary | ICD-10-CM | POA: Diagnosis not present

## 2015-12-04 LAB — GLUCOSE, CAPILLARY
Glucose-Capillary: 195 mg/dL — ABNORMAL HIGH (ref 65–99)
Glucose-Capillary: 173 mg/dL — ABNORMAL HIGH (ref 65–99)

## 2015-12-05 DIAGNOSIS — M86672 Other chronic osteomyelitis, left ankle and foot: Secondary | ICD-10-CM | POA: Diagnosis not present

## 2015-12-05 DIAGNOSIS — L97511 Non-pressure chronic ulcer of other part of right foot limited to breakdown of skin: Secondary | ICD-10-CM | POA: Diagnosis not present

## 2015-12-05 DIAGNOSIS — E11621 Type 2 diabetes mellitus with foot ulcer: Secondary | ICD-10-CM | POA: Diagnosis not present

## 2015-12-05 DIAGNOSIS — G4730 Sleep apnea, unspecified: Secondary | ICD-10-CM | POA: Diagnosis not present

## 2015-12-05 LAB — GLUCOSE, CAPILLARY
Glucose-Capillary: 121 mg/dL — ABNORMAL HIGH (ref 65–99)
Glucose-Capillary: 196 mg/dL — ABNORMAL HIGH (ref 65–99)
Glucose-Capillary: 148 mg/dL — ABNORMAL HIGH (ref 65–99)
Glucose-Capillary: 137 mg/dL — ABNORMAL HIGH (ref 65–99)

## 2015-12-09 DIAGNOSIS — G4730 Sleep apnea, unspecified: Secondary | ICD-10-CM | POA: Diagnosis not present

## 2015-12-09 DIAGNOSIS — E11621 Type 2 diabetes mellitus with foot ulcer: Secondary | ICD-10-CM | POA: Diagnosis not present

## 2015-12-09 DIAGNOSIS — L97511 Non-pressure chronic ulcer of other part of right foot limited to breakdown of skin: Secondary | ICD-10-CM | POA: Diagnosis not present

## 2015-12-09 DIAGNOSIS — M86672 Other chronic osteomyelitis, left ankle and foot: Secondary | ICD-10-CM | POA: Diagnosis not present

## 2015-12-09 LAB — GLUCOSE, CAPILLARY
Glucose-Capillary: 124 mg/dL — ABNORMAL HIGH (ref 65–99)
Glucose-Capillary: 111 mg/dL — ABNORMAL HIGH (ref 65–99)

## 2015-12-10 DIAGNOSIS — G4730 Sleep apnea, unspecified: Secondary | ICD-10-CM | POA: Diagnosis not present

## 2015-12-10 DIAGNOSIS — E11621 Type 2 diabetes mellitus with foot ulcer: Secondary | ICD-10-CM | POA: Diagnosis not present

## 2015-12-10 DIAGNOSIS — L97511 Non-pressure chronic ulcer of other part of right foot limited to breakdown of skin: Secondary | ICD-10-CM | POA: Diagnosis not present

## 2015-12-10 DIAGNOSIS — M86672 Other chronic osteomyelitis, left ankle and foot: Secondary | ICD-10-CM | POA: Diagnosis not present

## 2015-12-10 LAB — GLUCOSE, CAPILLARY
Glucose-Capillary: 167 mg/dL — ABNORMAL HIGH (ref 65–99)
Glucose-Capillary: 171 mg/dL — ABNORMAL HIGH (ref 65–99)

## 2015-12-11 DIAGNOSIS — G4730 Sleep apnea, unspecified: Secondary | ICD-10-CM | POA: Diagnosis not present

## 2015-12-11 DIAGNOSIS — L97511 Non-pressure chronic ulcer of other part of right foot limited to breakdown of skin: Secondary | ICD-10-CM | POA: Diagnosis not present

## 2015-12-11 DIAGNOSIS — M86672 Other chronic osteomyelitis, left ankle and foot: Secondary | ICD-10-CM | POA: Diagnosis not present

## 2015-12-11 DIAGNOSIS — E11621 Type 2 diabetes mellitus with foot ulcer: Secondary | ICD-10-CM | POA: Diagnosis not present

## 2015-12-11 LAB — GLUCOSE, CAPILLARY
Glucose-Capillary: 140 mg/dL — ABNORMAL HIGH (ref 65–99)
Glucose-Capillary: 161 mg/dL — ABNORMAL HIGH (ref 65–99)

## 2015-12-12 DIAGNOSIS — G4730 Sleep apnea, unspecified: Secondary | ICD-10-CM | POA: Diagnosis not present

## 2015-12-12 DIAGNOSIS — E11621 Type 2 diabetes mellitus with foot ulcer: Secondary | ICD-10-CM | POA: Diagnosis not present

## 2015-12-12 DIAGNOSIS — L97511 Non-pressure chronic ulcer of other part of right foot limited to breakdown of skin: Secondary | ICD-10-CM | POA: Diagnosis not present

## 2015-12-12 DIAGNOSIS — M86672 Other chronic osteomyelitis, left ankle and foot: Secondary | ICD-10-CM | POA: Diagnosis not present

## 2015-12-12 LAB — GLUCOSE, CAPILLARY
Glucose-Capillary: 113 mg/dL — ABNORMAL HIGH (ref 65–99)
Glucose-Capillary: 239 mg/dL — ABNORMAL HIGH (ref 65–99)

## 2015-12-16 ENCOUNTER — Encounter (HOSPITAL_BASED_OUTPATIENT_CLINIC_OR_DEPARTMENT_OTHER): Payer: BLUE CROSS/BLUE SHIELD | Attending: Internal Medicine

## 2015-12-16 DIAGNOSIS — T82855A Stenosis of coronary artery stent, initial encounter: Secondary | ICD-10-CM | POA: Diagnosis not present

## 2015-12-16 DIAGNOSIS — I252 Old myocardial infarction: Secondary | ICD-10-CM | POA: Diagnosis not present

## 2015-12-16 DIAGNOSIS — M109 Gout, unspecified: Secondary | ICD-10-CM | POA: Insufficient documentation

## 2015-12-16 DIAGNOSIS — E11649 Type 2 diabetes mellitus with hypoglycemia without coma: Secondary | ICD-10-CM | POA: Diagnosis not present

## 2015-12-16 DIAGNOSIS — E1140 Type 2 diabetes mellitus with diabetic neuropathy, unspecified: Secondary | ICD-10-CM | POA: Insufficient documentation

## 2015-12-16 DIAGNOSIS — G4730 Sleep apnea, unspecified: Secondary | ICD-10-CM | POA: Diagnosis not present

## 2015-12-16 DIAGNOSIS — I2510 Atherosclerotic heart disease of native coronary artery without angina pectoris: Secondary | ICD-10-CM | POA: Diagnosis not present

## 2015-12-16 DIAGNOSIS — L84 Corns and callosities: Secondary | ICD-10-CM | POA: Diagnosis not present

## 2015-12-16 DIAGNOSIS — M86672 Other chronic osteomyelitis, left ankle and foot: Secondary | ICD-10-CM | POA: Insufficient documentation

## 2015-12-16 DIAGNOSIS — L97511 Non-pressure chronic ulcer of other part of right foot limited to breakdown of skin: Secondary | ICD-10-CM | POA: Diagnosis not present

## 2015-12-16 DIAGNOSIS — Y831 Surgical operation with implant of artificial internal device as the cause of abnormal reaction of the patient, or of later complication, without mention of misadventure at the time of the procedure: Secondary | ICD-10-CM | POA: Insufficient documentation

## 2015-12-16 DIAGNOSIS — E11621 Type 2 diabetes mellitus with foot ulcer: Secondary | ICD-10-CM | POA: Diagnosis not present

## 2015-12-16 LAB — GLUCOSE, CAPILLARY
Glucose-Capillary: 92 mg/dL (ref 65–99)
Glucose-Capillary: 92 mg/dL (ref 65–99)
Glucose-Capillary: 93 mg/dL (ref 65–99)
Glucose-Capillary: 96 mg/dL (ref 65–99)

## 2015-12-17 DIAGNOSIS — M86672 Other chronic osteomyelitis, left ankle and foot: Secondary | ICD-10-CM | POA: Diagnosis not present

## 2015-12-17 LAB — GLUCOSE, CAPILLARY
Glucose-Capillary: 235 mg/dL — ABNORMAL HIGH (ref 65–99)
Glucose-Capillary: 239 mg/dL — ABNORMAL HIGH (ref 65–99)

## 2015-12-18 DIAGNOSIS — M86672 Other chronic osteomyelitis, left ankle and foot: Secondary | ICD-10-CM | POA: Diagnosis not present

## 2015-12-18 LAB — GLUCOSE, CAPILLARY
Glucose-Capillary: 160 mg/dL — ABNORMAL HIGH (ref 65–99)
Glucose-Capillary: 144 mg/dL — ABNORMAL HIGH (ref 65–99)

## 2015-12-19 DIAGNOSIS — M86672 Other chronic osteomyelitis, left ankle and foot: Secondary | ICD-10-CM | POA: Diagnosis not present

## 2015-12-19 LAB — GLUCOSE, CAPILLARY
Glucose-Capillary: 168 mg/dL — ABNORMAL HIGH (ref 65–99)
Glucose-Capillary: 61 mg/dL — ABNORMAL LOW (ref 65–99)
Glucose-Capillary: 62 mg/dL — ABNORMAL LOW (ref 65–99)

## 2015-12-22 DIAGNOSIS — M86672 Other chronic osteomyelitis, left ankle and foot: Secondary | ICD-10-CM | POA: Diagnosis not present

## 2015-12-22 LAB — GLUCOSE, CAPILLARY
Glucose-Capillary: 263 mg/dL — ABNORMAL HIGH (ref 65–99)
Glucose-Capillary: 157 mg/dL — ABNORMAL HIGH (ref 65–99)

## 2015-12-24 DIAGNOSIS — M86672 Other chronic osteomyelitis, left ankle and foot: Secondary | ICD-10-CM | POA: Diagnosis not present

## 2015-12-24 LAB — GLUCOSE, CAPILLARY
Glucose-Capillary: 356 mg/dL — ABNORMAL HIGH (ref 65–99)
Glucose-Capillary: 314 mg/dL — ABNORMAL HIGH (ref 65–99)

## 2015-12-25 DIAGNOSIS — M86672 Other chronic osteomyelitis, left ankle and foot: Secondary | ICD-10-CM | POA: Diagnosis not present

## 2015-12-25 LAB — GLUCOSE, CAPILLARY
Glucose-Capillary: 111 mg/dL — ABNORMAL HIGH (ref 65–99)
Glucose-Capillary: 194 mg/dL — ABNORMAL HIGH (ref 65–99)

## 2015-12-26 DIAGNOSIS — M86672 Other chronic osteomyelitis, left ankle and foot: Secondary | ICD-10-CM | POA: Diagnosis not present

## 2015-12-26 LAB — GLUCOSE, CAPILLARY
Glucose-Capillary: 196 mg/dL — ABNORMAL HIGH (ref 65–99)
Glucose-Capillary: 182 mg/dL — ABNORMAL HIGH (ref 65–99)

## 2015-12-29 DIAGNOSIS — M86672 Other chronic osteomyelitis, left ankle and foot: Secondary | ICD-10-CM | POA: Diagnosis not present

## 2015-12-29 LAB — GLUCOSE, CAPILLARY
Glucose-Capillary: 311 mg/dL — ABNORMAL HIGH (ref 65–99)
Glucose-Capillary: 266 mg/dL — ABNORMAL HIGH (ref 65–99)

## 2015-12-30 DIAGNOSIS — M86672 Other chronic osteomyelitis, left ankle and foot: Secondary | ICD-10-CM | POA: Diagnosis not present

## 2015-12-30 LAB — GLUCOSE, CAPILLARY
Glucose-Capillary: 265 mg/dL — ABNORMAL HIGH (ref 65–99)
Glucose-Capillary: 219 mg/dL — ABNORMAL HIGH (ref 65–99)

## 2015-12-31 DIAGNOSIS — M86672 Other chronic osteomyelitis, left ankle and foot: Secondary | ICD-10-CM | POA: Diagnosis not present

## 2015-12-31 LAB — GLUCOSE, CAPILLARY
Glucose-Capillary: 237 mg/dL — ABNORMAL HIGH (ref 65–99)
Glucose-Capillary: 236 mg/dL — ABNORMAL HIGH (ref 65–99)

## 2016-01-01 DIAGNOSIS — M86672 Other chronic osteomyelitis, left ankle and foot: Secondary | ICD-10-CM | POA: Diagnosis not present

## 2016-01-01 LAB — GLUCOSE, CAPILLARY
Glucose-Capillary: 208 mg/dL — ABNORMAL HIGH (ref 65–99)
Glucose-Capillary: 86 mg/dL (ref 65–99)

## 2016-01-02 DIAGNOSIS — M86672 Other chronic osteomyelitis, left ankle and foot: Secondary | ICD-10-CM | POA: Diagnosis not present

## 2016-01-02 LAB — GLUCOSE, CAPILLARY
Glucose-Capillary: 186 mg/dL — ABNORMAL HIGH (ref 65–99)
Glucose-Capillary: 195 mg/dL — ABNORMAL HIGH (ref 65–99)

## 2016-01-04 ENCOUNTER — Other Ambulatory Visit: Payer: Self-pay | Admitting: Physician Assistant

## 2016-01-05 DIAGNOSIS — M86672 Other chronic osteomyelitis, left ankle and foot: Secondary | ICD-10-CM | POA: Diagnosis not present

## 2016-01-05 LAB — GLUCOSE, CAPILLARY
Glucose-Capillary: 204 mg/dL — ABNORMAL HIGH (ref 65–99)
Glucose-Capillary: 147 mg/dL — ABNORMAL HIGH (ref 65–99)

## 2016-01-05 NOTE — Telephone Encounter (Signed)
Please review for a refill. Thanks! 

## 2016-01-06 DIAGNOSIS — M86672 Other chronic osteomyelitis, left ankle and foot: Secondary | ICD-10-CM | POA: Diagnosis not present

## 2016-01-06 LAB — GLUCOSE, CAPILLARY
Glucose-Capillary: 243 mg/dL — ABNORMAL HIGH (ref 65–99)
Glucose-Capillary: 228 mg/dL — ABNORMAL HIGH (ref 65–99)

## 2016-01-07 DIAGNOSIS — M86672 Other chronic osteomyelitis, left ankle and foot: Secondary | ICD-10-CM | POA: Diagnosis not present

## 2016-01-07 LAB — GLUCOSE, CAPILLARY
Glucose-Capillary: 177 mg/dL — ABNORMAL HIGH (ref 65–99)
Glucose-Capillary: 171 mg/dL — ABNORMAL HIGH (ref 65–99)

## 2016-01-08 DIAGNOSIS — M86672 Other chronic osteomyelitis, left ankle and foot: Secondary | ICD-10-CM | POA: Diagnosis not present

## 2016-01-08 LAB — GLUCOSE, CAPILLARY
Glucose-Capillary: 141 mg/dL — ABNORMAL HIGH (ref 65–99)
Glucose-Capillary: 168 mg/dL — ABNORMAL HIGH (ref 65–99)

## 2016-01-09 DIAGNOSIS — M86672 Other chronic osteomyelitis, left ankle and foot: Secondary | ICD-10-CM | POA: Diagnosis not present

## 2016-01-09 LAB — GLUCOSE, CAPILLARY
Glucose-Capillary: 184 mg/dL — ABNORMAL HIGH (ref 65–99)
Glucose-Capillary: 222 mg/dL — ABNORMAL HIGH (ref 65–99)

## 2016-01-12 DIAGNOSIS — M86672 Other chronic osteomyelitis, left ankle and foot: Secondary | ICD-10-CM | POA: Diagnosis not present

## 2016-01-12 LAB — GLUCOSE, CAPILLARY
Glucose-Capillary: 169 mg/dL — ABNORMAL HIGH (ref 65–99)
Glucose-Capillary: 201 mg/dL — ABNORMAL HIGH (ref 65–99)

## 2016-01-13 DIAGNOSIS — M86672 Other chronic osteomyelitis, left ankle and foot: Secondary | ICD-10-CM | POA: Diagnosis not present

## 2016-01-13 LAB — GLUCOSE, CAPILLARY
Glucose-Capillary: 107 mg/dL — ABNORMAL HIGH (ref 65–99)
Glucose-Capillary: 186 mg/dL — ABNORMAL HIGH (ref 65–99)

## 2016-01-14 ENCOUNTER — Encounter (HOSPITAL_BASED_OUTPATIENT_CLINIC_OR_DEPARTMENT_OTHER): Payer: BLUE CROSS/BLUE SHIELD | Attending: Surgery

## 2016-01-14 DIAGNOSIS — E1136 Type 2 diabetes mellitus with diabetic cataract: Secondary | ICD-10-CM | POA: Diagnosis not present

## 2016-01-14 DIAGNOSIS — M109 Gout, unspecified: Secondary | ICD-10-CM | POA: Insufficient documentation

## 2016-01-14 DIAGNOSIS — L97511 Non-pressure chronic ulcer of other part of right foot limited to breakdown of skin: Secondary | ICD-10-CM | POA: Diagnosis not present

## 2016-01-14 DIAGNOSIS — E11621 Type 2 diabetes mellitus with foot ulcer: Secondary | ICD-10-CM | POA: Diagnosis present

## 2016-01-14 DIAGNOSIS — E1169 Type 2 diabetes mellitus with other specified complication: Secondary | ICD-10-CM | POA: Insufficient documentation

## 2016-01-14 DIAGNOSIS — I252 Old myocardial infarction: Secondary | ICD-10-CM | POA: Insufficient documentation

## 2016-01-14 DIAGNOSIS — G4730 Sleep apnea, unspecified: Secondary | ICD-10-CM | POA: Diagnosis not present

## 2016-01-14 DIAGNOSIS — E1140 Type 2 diabetes mellitus with diabetic neuropathy, unspecified: Secondary | ICD-10-CM | POA: Insufficient documentation

## 2016-01-14 DIAGNOSIS — I2510 Atherosclerotic heart disease of native coronary artery without angina pectoris: Secondary | ICD-10-CM | POA: Diagnosis not present

## 2016-01-14 DIAGNOSIS — M86672 Other chronic osteomyelitis, left ankle and foot: Secondary | ICD-10-CM | POA: Diagnosis not present

## 2016-01-14 LAB — GLUCOSE, CAPILLARY
Glucose-Capillary: 272 mg/dL — ABNORMAL HIGH (ref 65–99)
Glucose-Capillary: 238 mg/dL — ABNORMAL HIGH (ref 65–99)

## 2016-01-15 DIAGNOSIS — E1169 Type 2 diabetes mellitus with other specified complication: Secondary | ICD-10-CM | POA: Diagnosis not present

## 2016-01-15 LAB — GLUCOSE, CAPILLARY
Glucose-Capillary: 195 mg/dL — ABNORMAL HIGH (ref 65–99)
Glucose-Capillary: 214 mg/dL — ABNORMAL HIGH (ref 65–99)

## 2016-01-16 ENCOUNTER — Ambulatory Visit (HOSPITAL_BASED_OUTPATIENT_CLINIC_OR_DEPARTMENT_OTHER): Payer: BLUE CROSS/BLUE SHIELD

## 2016-01-19 DIAGNOSIS — E1169 Type 2 diabetes mellitus with other specified complication: Secondary | ICD-10-CM | POA: Diagnosis not present

## 2016-01-19 LAB — GLUCOSE, CAPILLARY
Glucose-Capillary: 191 mg/dL — ABNORMAL HIGH (ref 65–99)
Glucose-Capillary: 214 mg/dL — ABNORMAL HIGH (ref 65–99)

## 2016-01-20 DIAGNOSIS — E1169 Type 2 diabetes mellitus with other specified complication: Secondary | ICD-10-CM | POA: Diagnosis not present

## 2016-01-20 LAB — GLUCOSE, CAPILLARY
Glucose-Capillary: 227 mg/dL — ABNORMAL HIGH (ref 65–99)
Glucose-Capillary: 216 mg/dL — ABNORMAL HIGH (ref 65–99)

## 2016-01-21 DIAGNOSIS — E1169 Type 2 diabetes mellitus with other specified complication: Secondary | ICD-10-CM | POA: Diagnosis not present

## 2016-01-21 LAB — GLUCOSE, CAPILLARY
Glucose-Capillary: 206 mg/dL — ABNORMAL HIGH (ref 65–99)
Glucose-Capillary: 192 mg/dL — ABNORMAL HIGH (ref 65–99)

## 2016-01-22 DIAGNOSIS — E1169 Type 2 diabetes mellitus with other specified complication: Secondary | ICD-10-CM | POA: Diagnosis not present

## 2016-01-23 ENCOUNTER — Other Ambulatory Visit: Payer: Self-pay | Admitting: Internal Medicine

## 2016-01-23 ENCOUNTER — Ambulatory Visit (HOSPITAL_COMMUNITY)
Admission: RE | Admit: 2016-01-23 | Discharge: 2016-01-23 | Disposition: A | Discharge status: Home / Self Care | Payer: BLUE CROSS/BLUE SHIELD | Source: Ambulatory Visit | Attending: Internal Medicine | Admitting: Internal Medicine

## 2016-01-23 DIAGNOSIS — S92404A Nondisplaced unspecified fracture of right great toe, initial encounter for closed fracture: Secondary | ICD-10-CM | POA: Diagnosis not present

## 2016-01-23 DIAGNOSIS — X58XXXA Exposure to other specified factors, initial encounter: Secondary | ICD-10-CM | POA: Insufficient documentation

## 2016-01-23 DIAGNOSIS — M869 Osteomyelitis, unspecified: Secondary | ICD-10-CM

## 2016-01-23 DIAGNOSIS — E1169 Type 2 diabetes mellitus with other specified complication: Secondary | ICD-10-CM | POA: Diagnosis not present

## 2016-01-23 LAB — GLUCOSE, CAPILLARY
Glucose-Capillary: 231 mg/dL — ABNORMAL HIGH (ref 65–99)
Glucose-Capillary: 216 mg/dL — ABNORMAL HIGH (ref 65–99)

## 2016-01-26 LAB — GLUCOSE, CAPILLARY
Glucose-Capillary: 102 mg/dL — ABNORMAL HIGH (ref 65–99)
Glucose-Capillary: 199 mg/dL — ABNORMAL HIGH (ref 65–99)

## 2016-01-29 DIAGNOSIS — E1169 Type 2 diabetes mellitus with other specified complication: Secondary | ICD-10-CM | POA: Diagnosis not present

## 2016-02-01 ENCOUNTER — Other Ambulatory Visit: Payer: Self-pay | Admitting: Physician Assistant

## 2016-02-02 NOTE — Telephone Encounter (Signed)
Rx request sent to pharmacy.  

## 2016-02-05 DIAGNOSIS — E1169 Type 2 diabetes mellitus with other specified complication: Secondary | ICD-10-CM | POA: Diagnosis not present

## 2016-02-12 ENCOUNTER — Encounter (HOSPITAL_BASED_OUTPATIENT_CLINIC_OR_DEPARTMENT_OTHER): Payer: BLUE CROSS/BLUE SHIELD | Attending: Internal Medicine

## 2016-02-12 DIAGNOSIS — M86671 Other chronic osteomyelitis, right ankle and foot: Secondary | ICD-10-CM | POA: Diagnosis not present

## 2016-02-12 DIAGNOSIS — L03032 Cellulitis of left toe: Secondary | ICD-10-CM | POA: Insufficient documentation

## 2016-02-12 DIAGNOSIS — E1140 Type 2 diabetes mellitus with diabetic neuropathy, unspecified: Secondary | ICD-10-CM | POA: Insufficient documentation

## 2016-02-12 DIAGNOSIS — I252 Old myocardial infarction: Secondary | ICD-10-CM | POA: Diagnosis not present

## 2016-02-12 DIAGNOSIS — I2510 Atherosclerotic heart disease of native coronary artery without angina pectoris: Secondary | ICD-10-CM | POA: Insufficient documentation

## 2016-02-12 DIAGNOSIS — G4730 Sleep apnea, unspecified: Secondary | ICD-10-CM | POA: Diagnosis not present

## 2016-02-12 DIAGNOSIS — L97519 Non-pressure chronic ulcer of other part of right foot with unspecified severity: Secondary | ICD-10-CM | POA: Diagnosis not present

## 2016-02-12 DIAGNOSIS — L97529 Non-pressure chronic ulcer of other part of left foot with unspecified severity: Secondary | ICD-10-CM | POA: Diagnosis not present

## 2016-02-12 DIAGNOSIS — E1169 Type 2 diabetes mellitus with other specified complication: Secondary | ICD-10-CM | POA: Diagnosis not present

## 2016-02-12 DIAGNOSIS — E11621 Type 2 diabetes mellitus with foot ulcer: Secondary | ICD-10-CM | POA: Diagnosis present

## 2016-02-16 ENCOUNTER — Encounter: Payer: Self-pay | Admitting: Cardiovascular Disease

## 2016-02-16 ENCOUNTER — Ambulatory Visit (INDEPENDENT_AMBULATORY_CARE_PROVIDER_SITE_OTHER): Payer: BLUE CROSS/BLUE SHIELD | Admitting: Cardiovascular Disease

## 2016-02-16 VITALS — BP 124/108 | HR 80 | Ht 69.0 in | Wt 297.0 lb

## 2016-02-16 DIAGNOSIS — E119 Type 2 diabetes mellitus without complications: Secondary | ICD-10-CM | POA: Diagnosis not present

## 2016-02-16 DIAGNOSIS — I2510 Atherosclerotic heart disease of native coronary artery without angina pectoris: Secondary | ICD-10-CM

## 2016-02-16 DIAGNOSIS — E785 Hyperlipidemia, unspecified: Secondary | ICD-10-CM | POA: Diagnosis not present

## 2016-02-16 DIAGNOSIS — E1143 Type 2 diabetes mellitus with diabetic autonomic (poly)neuropathy: Secondary | ICD-10-CM

## 2016-02-16 DIAGNOSIS — R739 Hyperglycemia, unspecified: Secondary | ICD-10-CM

## 2016-02-16 DIAGNOSIS — G4730 Sleep apnea, unspecified: Secondary | ICD-10-CM

## 2016-02-16 DIAGNOSIS — IMO0002 Reserved for concepts with insufficient information to code with codable children: Secondary | ICD-10-CM

## 2016-02-16 DIAGNOSIS — E1165 Type 2 diabetes mellitus with hyperglycemia: Secondary | ICD-10-CM

## 2016-02-16 DIAGNOSIS — I10 Essential (primary) hypertension: Secondary | ICD-10-CM

## 2016-02-16 DIAGNOSIS — I25810 Atherosclerosis of coronary artery bypass graft(s) without angina pectoris: Secondary | ICD-10-CM

## 2016-02-16 LAB — LIPID PANEL
Cholesterol: 205 mg/dL — ABNORMAL HIGH (ref 125–200)
HDL: 102 mg/dL (ref >=40)
LDL Cholesterol: 83 mg/dL (ref <130)
Total CHOL/HDL Ratio: 2 Ratio (ref <=5.0)
Triglycerides: 101 mg/dL (ref <150)
VLDL: 20 mg/dL (ref <30)

## 2016-02-16 LAB — CBC
HCT: 39.6 % (ref 39.0–52.0)
Hemoglobin: 13.1 g/dL (ref 13.0–17.0)
MCH: 27.9 pg (ref 26.0–34.0)
MCHC: 33.1 g/dL (ref 30.0–36.0)
MCV: 84.4 fL (ref 78.0–100.0)
MPV: 9.4 fL (ref 8.6–12.4)
Platelets: 177 10*3/uL (ref 150–400)
RBC: 4.69 MIL/uL (ref 4.22–5.81)
RDW: 14.1 % (ref 11.5–15.5)
WBC: 6.5 10*3/uL (ref 4.0–10.5)

## 2016-02-16 LAB — COMPREHENSIVE METABOLIC PANEL
ALT: 23 U/L (ref 9–46)
AST: 20 U/L (ref 10–35)
Albumin: 3.5 g/dL — ABNORMAL LOW (ref 3.6–5.1)
Alkaline Phosphatase: 131 U/L — ABNORMAL HIGH (ref 40–115)
BUN: 21 mg/dL (ref 7–25)
CO2: 24 mmol/L (ref 20–31)
Calcium: 8.9 mg/dL (ref 8.6–10.3)
Chloride: 103 mmol/L (ref 98–110)
Creat: 1.29 mg/dL (ref 0.70–1.33)
Glucose, Bld: 145 mg/dL — ABNORMAL HIGH (ref 65–99)
Potassium: 3.8 mmol/L (ref 3.5–5.3)
Sodium: 140 mmol/L (ref 135–146)
Total Bilirubin: 0.4 mg/dL (ref 0.2–1.2)
Total Protein: 6.9 g/dL (ref 6.1–8.1)

## 2016-02-16 LAB — TSH: TSH: 2.26 mIU/L (ref 0.40–4.50)

## 2016-02-16 LAB — HEMOGLOBIN A1C
Hgb A1c MFr Bld: 10.8 % — ABNORMAL HIGH (ref <5.7)
Mean Plasma Glucose: 263 mg/dL — ABNORMAL HIGH (ref <117)

## 2016-02-16 MED ORDER — HYDRALAZINE HCL 25 MG PO TABS: 37.5000 mg | ORAL_TABLET | Freq: Two times a day (BID) | ORAL | Status: DC

## 2016-02-16 MED ORDER — TICAGRELOR 60 MG PO TABS: 60.0000 mg | ORAL_TABLET | Freq: Two times a day (BID) | ORAL | Status: DC

## 2016-02-16 NOTE — Progress Notes (Signed)
Patient ID: Joshua [name], male   DOB: 08/24/1956, 60 y.o.   MRN: [id number]      HPI: RYLE BUSCEMI is a 60 y.o. male who presents to the office for a4 month follow-up cardiology evaluation.   [name] suffered an acute coronary syndrome on 10/31/2011 and presented with ECG findings of anterolateral ST segment elevation. Cardiac catheterization revealed a 99% mid LAD stenosis and proximal to this there was a 60-70% stenosis after the first diagonal vessel. He also had 90-95% mid RCA stenosis and 30% circumflex stenosis. He underwent initial acute intervention to his LAD and a 3.0x32 mm Promus DES stent was inserted. 3 days later he underwent successful staged PCI to his 95% RCA stenosis and a 3.0x18 mm Resolute DES stent was inserted and post dilated 3.5 mm.   Additional problems include at least a >30 year history of hypertension, a 25 year history of diabetes mellitus, obesity, as well as hyperlipidemia.  He was admitted to the hospital on 07/29/2015 with complaints of shortness of breath similar to his initial symptomatology.  He had mild renal insufficiency, was hydrated and underwent cardiac catheterization by Dr. Julianne Handler in 07/30/2015.  His LAD stents were patent.  There was mild 40% first agonal stenosis, 60% circumflex stenosis, and his RCA stents had a 50% intimal narrowing in its mid stent without significant narrowing in the distal stent.  An echo Doppler study on 07/31/2015 showed an EF of 45-50% with moderate left ventricular hypertrophy and grade 1 diastolic dysfunction.   He was seen by Jorja Loa on 08/08/2015 for initial posthospital evaluation.  Since that time, he has had some vague symptoms of GERD.  He is not on any proton pump inhibitor.  He gets shortness of breath with minimal activity including brushing his teeth and taking a shower.  He has not been routinely exercising.  He has obstructive sleep apnea and uses CPAP therapy.  He denies residual snoring.  He works at the  [employer] in the night shift.  At times he feels that some of his food may get stuck when swallowing but this is not consistent.    When I saw him in September 2016, I started him on Protonix for significant GERD symptoms which has improved.  We discussed weight loss and exercise. Laboratory showed worsening renal function with a creatinine of 1.84, significant glucose elevation at 669 with probable factitious hyponatremia at 129, due to high glucose levels.  His hemoglobin A1c was 14.2.  I referred him to Dr. Michiel Sites for endocrinologic evaluation. I also discontinued his spironolactone and added hydralazine 25 mg twice a day for more optimal blood pressure control in light of his renal insufficiency.  We discussed the importance of weight loss.  He has been in a cast and boot in his right leg due to prior infection of his right toe. He did undergo hyperbaric chamber therapy.  He tells me he was initially told of having borderline osteomyelitis. Four weeks ago his cultures were negative.  He denies recent chest pain, palpitations, dyspnea on exertion or bleeding.  He presents for cardiology evaluation    Past Medical History  Diagnosis Date  . Essential hypertension   . Hyperlipemia   . GERD (gastroesophageal reflux disease)   . Polyneuropathy in diabetes(357.2)   . Coronary artery disease     a. 2012 s/p MI/PCI: s/p DES to mid/dist RCA and overlapping DES to LAD;  b. 07/2015 Cath: LAD 10% ISR, D1 40(jailed), LCX  90m OM2 30, OM3 30, RCA 533mSR, 10d ISR.  . [name] Kitchenhronic combined systolic and diastolic CHF (congestive heart failure) ([facility]    a. 07/2015 Echo: EF 45-50%, mod LVH, mid apical/antsept AK, Gr 1 DD.  . [name] Kitcheneart murmur   . Myocardial infarction (HCEaston2012  . OSA on CPAP   . Pulmonary sarcoidosis ([facility]    "problems w/it years ago; no problems anymore" (07/03/2014)  . Type II diabetes mellitus (HCD[facility]  . Diabetic gastroparesis (HC[facility]ed[facility]  . Gout   . Morbid obesity ([facility]     Past Surgical History  Procedure Laterality Date  . Incision and drainage Right 10/28/2013    Procedure: INCISION AND DRAINAGE Right small finger;  Surgeon: MaSchuyler AmorMD;  Location: [facility];  Service: Orthopedics;  Laterality: Right;  . I&d extremity Right 10/30/2013    Procedure: IRRIGATION AND DEBRIDEMENT RIGHT SMALL FINGER POSSIBLE SECONDARY WOUND CLOSURE;  Surgeon: MaSchuyler AmorMD;  Location: [facility];  Service: Orthopedics;  Laterality: Right;  WeBurney Gauzes available after 4pm  . I&d extremity Right 11/01/2013    Procedure:  IRRIGATION AND DEBRIDEMENT RIGHT  PROXIMAL PHALANGEAL LEVEL AMPUTATION;  Surgeon: MaSchuyler AmorMD;  Location: [facility];  Service: Orthopedics;  Laterality: Right;  . Doppler echocardiography N/A 11-23-2011    TECHNICALLY DIFFICULT. LV GROSSLY NORMAL IN SIZE. LV SYSTOLIC FUNCTION IS MILDLY REDUCED. EF=45-50%. DOPPLER FLOW PATTERN WAS NORMAL FOR AGE. MILD  . Nuclear stress test N/A 12-29-2011    MILD PERFUSION DEFECT IS SEEN IN THE APICAL SEPTAL AMD MID INFEROSEPTAL REGIONS. THIS IS CONSISTANT WITH INFARCT/SCAR. NO EVIDENCE OF INDUCIBLE ISCHEMIA. EF 41%. LV SYSTOLIC FUNCTION IS MODERATELY REDUCED.   . Coronary angioplasty with stent placement  10/31/2011    30 % MID ATRIOVENTRICULAR GROOVE CX STENOSIS. 90 % MID RCA.PTA TO LAD/STENTING OF TANDEM 60-70%. LAD STENOSIS 99% REDUCED TO 0%.3.0 X 32MM PROMUS DES POSTDILATED TO 3.25MM.  . [name] Kitchenoronary angioplasty with stent placement  07/03/2014    "2"  . Breast lumpectomy Right ~ 2000    "benign"  . Toe amputation Right ~ 2010    "2nd toe"  . Toe amputation Right 2012    "3rd toe"  . Cataract extraction w/ intraocular lens  implant, bilateral Bilateral 2014-2015  . Percutaneous coronary stent intervention (pci-s) Left 11/03/2011    Procedure: PERCUTANEOUS CORONARY STENT INTERVENTION (PCI-S);  Surgeon: ThTroy SineMD;  [facility];  Service: Cardiovascular;  Laterality: Left;  . Left heart cath  Left 11/03/2011    Procedure: LEFT HEART CATH;  Surgeon: ThTroy SineMD;  [facility];  Service: Cardiovascular;  Laterality: Left;  . Left heart catheterization with coronary angiogram N/A 07/03/2014    Procedure: LEFT HEART CATHETERIZATION WITH CORONARY ANGIOGRAM;  Surgeon: HeSinclair GroomsMD;  [facility];  Service: Cardiovascular;  Laterality: N/A;  . Cardiac catheterization N/A 07/30/2015    Procedure: Left Heart Cath and Coronary Angiography;  Surgeon: ChBurnell BlanksMD;  Location: [facility];  Service: Cardiovascular;  Laterality: N/A;    No Known Allergies  Current Outpatient Prescriptions  Medication Sig Dispense Refill  . allopurinol (ZYLOPRIM) 300 MG tablet Take 300 mg by mouth daily.     . [name] KitchenmLODipine-olmesartan (AZOR) 10-40 MG tablet TAKE 1 TABLET EVERY DAY (WAITING ON P/A) 30 tablet 0  . aspirin EC 81 MG tablet Take 81 mg by mouth daily.    . [name] KitchenRILINTA 90 MG  TABS tablet TAKE 1 TABLET (90 MG TOTAL) BY MOUTH 2 (TWO) TIMES DAILY. 60 tablet 7  . doxazosin (CARDURA) 8 MG tablet Take 8 mg by mouth at bedtime.  5  . furosemide (LASIX) 40 MG tablet TAKE 1 TABLET (40 MG TOTAL) BY MOUTH DAILY. 30 tablet 3  . hydrALAZINE (APRESOLINE) 25 MG tablet Take 1 tablet (25 mg total) by mouth 2 (two) times daily. 60 tablet 6  . insulin NPH-regular (HUMULIN 70/30) (70-30) 100 UNIT/ML injection Inject 40 Units into the skin 2 (two) times daily. 10 mL 12  . isosorbide mononitrate (IMDUR) 60 MG 24 hr tablet Take 1 tablet (60 mg total) by mouth daily. 30 tablet 11  . metoprolol succinate (TOPROL-XL) 100 MG 24 hr tablet Take 1 tablet (100 mg total) by mouth daily. Take with or immediately following a meal. 30 tablet 6  . nitroGLYCERIN (NITROSTAT) 0.4 MG SL tablet Place 1 tablet (0.4 mg total) under the tongue every 5 (five) minutes as needed for chest pain. 25 tablet 3  . NOVOLIN R 100 UNIT/ML injection 100 Units 2 (two) times daily.  6  . pantoprazole (PROTONIX) 40  MG tablet Take 1 tablet (40 mg total) by mouth daily. 30 tablet 11  . rosuvastatin (CRESTOR) 40 MG tablet Take 1 tablet (40 mg total) by mouth daily.     No current facility-administered medications for this visit.    Social History   Social History  . Marital Status: Married    Spouse Name: N/A  . Number of Children: N/A  . Years of Education: N/A   Occupational History  . Not on file.   Social History Main Topics  . Smoking status: Never Smoker   . Smokeless tobacco: Never Used  . Alcohol Use: Yes     Comment: Pt sts he binge drinks on the weekends  . Drug Use: No  . Sexual Activity: Yes   Other Topics Concern  . Not on file   Social History Narrative    Family History  Problem Relation Age of Onset  . Heart attack Maternal Aunt   . Diabetes Maternal Grandmother   . Hypertension Maternal Grandmother     ROS General: Negative; No fevers, chills, or night sweats;  HEENT: Negative; No changes in vision or hearing, sinus congestion, difficulty swallowing Pulmonary: Negative; No cough, wheezing, shortness of breath, hemoptysis Cardiovascular: Negative; No chest pain, presyncope, syncope, palpitations GI: Negative; No nausea, vomiting, diarrhea, or abdominal pain GU: Negative; No dysuria, hematuria, or difficulty voiding Musculoskeletal: Negative; no myalgias, joint pain, or weakness Hematologic/Oncology: Negative; no easy bruising, bleeding Endocrine: Negative; no heat/cold intolerance; no diabetes Neuro: Negative; no changes in balance, headaches Skin: Negative; No rashes or skin lesions Psychiatric: Negative; No behavioral problems, depression Sleep: Positive for obstructive sleep apnea on CPAP therapy.  No residual snoring, daytime sleepiness, hypersomnolence, bruxism, restless legs, hypnogognic hallucinations, no cataplexy Other comprehensive 14 point system review is negative.   PE BP 124/108 mmHg  Pulse 80  Ht '5\' 9"'  (1.753 m)  Wt 297 lb (134.718 kg)  BMI  43.84 kg/m2  Repeat blood pressure by me was 132/90  Wt Readings from Last 3 Encounters:  02/16/16 297 lb (134.718 kg)  10/14/15 279 lb 14.4 oz (126.962 kg)  09/02/15 294 lb 1.6 oz (133.403 kg)   General: Alert, oriented, no distress.  Skin: normal turgor, no rashes HEENT: Normocephalic, atraumatic. Pupils round and reactive; sclera anicteric;no lid lag.  Nose without nasal septal hypertrophy Mouth/Parynx benign; Mallinpatti scale  3/4 Neck: Very thick neck; No JVD, no carotid bruits with normal carotid upstroke Lungs: clear to ausculatation and percussion; no wheezing or rales Chest wall: Nontender to palpation Heart: RRR, s1 s2 normal 1/6 systolic murmur, unchanged.  No S3 gallop.  No rubs thrills or heaves Abdomen: Moderate central adiposity. soft, nontender; no hepatosplenomehaly, BS+; abdominal aorta nontender and not dilated by palpation. Back: No CVA tenderness Pulses 2+ Extremities:  His right foot and lower leg is bandaged and he is in a boot, Homan's sign negative  Neurologic: grossly nonfocal Psychologic: normal affect and mood.  ECG (independently read by me):   Normal sinus rhythm at 80 bpm..  No ectopy.  Borderline left atrial enlargement.  November 2016 ECG (independently read by me):  Normal sinus rhythm at 77 bpm.  Poor R wave progression anteriorly.  QTc interval 425 ms. Borderline first-degree AV block.  ECG (independently read by me): Sinus rhythm at 85 bpm.  Early repolarization changes anterolaterally.  Prior 2014 ECG: Normal sinus rhythm at 90 beats per minute. Poor progression V1 through V4. Normal intervals.  LABS: BMP Latest Ref Rng 10/13/2015 08/07/2015 07/31/2015  Glucose 65 - 99 mg/dL 669(HH) 146(H) 231(H)  BUN 7 - 25 mg/dL 27(H) 29(H) 17  Creatinine 0.70 - 1.33 mg/dL 1.84(H) 1.44 1.30(H)  Sodium 135 - 146 mmol/L 129(L) 140 138  Potassium 3.5 - 5.3 mmol/L 4.9 4.0 4.2  Chloride 98 - 110 mmol/L 92(L) 104 107  CO2 20 - 31 mmol/L '24 28 24  ' Calcium 8.6 -  10.3 mg/dL 9.2 9.6 8.6(L)   Hepatic Function Latest Ref Rng 10/13/2015 07/03/2014 10/30/2013  Total Protein 6.1 - 8.1 g/dL 7.1 6.6 6.9  Albumin 3.6 - 5.1 g/dL 3.6 2.8(L) 2.5(L)  AST 10 - 35 U/L '10 23 10  ' ALT 9 - 46 U/L '11 21 9  ' Alk Phosphatase 40 - 115 U/L 112 120(H) 109  Total Bilirubin 0.2 - 1.2 mg/dL 0.3 <0.2(L) 0.2(L)   CBC Latest Ref Rng 07/31/2015 07/30/2015 07/29/2015  WBC 4.0 - 10.5 K/uL 5.1 5.2 -  Hemoglobin 13.0 - 17.0 g/dL 12.4(L) 12.8(L) 12.6(L)  Hematocrit 39.0 - 52.0 % 37.1(L) 39.1 37.0(L)  Platelets 150 - 400 K/uL 175 175 -   Lab Results  Component Value Date   MCV 85.1 07/31/2015   MCV 85.9 07/30/2015   MCV 85.8 07/29/2015   Lab Results  Component Value Date   TSH 1.295 10/13/2015   Lab Results  Component Value Date   HGBA1C 14.2* 10/13/2015   Lipid Panel     Component Value Date/Time   CHOL 155 10/13/2015 1201   TRIG 137 10/13/2015 1201   HDL 67 10/13/2015 1201   CHOLHDL 2.3 10/13/2015 1201   VLDL 27 10/13/2015 1201   LDLCALC 61 10/13/2015 1201      RADIOLOGY: Dg Chest 2 View  10/28/2013   CLINICAL DATA:  Shortness of breath  EXAM: CHEST  2 VIEW  COMPARISON:  10/31/2011  FINDINGS: Mild cardiomegaly, stable from prior. Kerley A and B lines noted. No significant effusion. No asymmetric airspace opacity. No acute osseous findings.  IMPRESSION: Mild interstitial pulmonary edema.   Electronically Signed   By: Jorje Guild M.D.   On: 10/28/2013 23:01   Dg Hand Complete Right  10/28/2013   CLINICAL DATA:  Little finger laceration with pain  EXAM: RIGHT HAND - COMPLETE 3+ VIEW  COMPARISON:  None.  FINDINGS: There is extensive soft tissue swelling of the 5th digit, extending into the dorsal hand. No  radiodense foreign body. No osseous erosion or asymmetric joint narrowing to suggest osseous/joint infection. No acute fracture or malalignment.  Remote ulnar and radial styloid process fractures.  Degenerative interphalangeal joint narrowing throughout the digits.   IMPRESSION: No evidence of osseous infection.  No radiodense foreign body.   Electronically Signed   By: Jorje Guild M.D.   On: 10/28/2013 23:04      ASSESSMENT AND PLAN: Mr. Javel Hersh is a 60 year old African-American male who suffered an acute coronary syndrome/ anterolateral ST segment elevation myocardial infarction in November 2012. At that time he underwent stenting to his subtotaled LAD stenosis and 3 days later underwent staged intervention to a high grade mid right coronary artery stenosis. A nuclear perfusion study in January 2013 was low risk and did show small area of anteroseptal scar towards the apex without associated ischemia.  He  presented with increasing shortness of breath and worrisome symptoms for unstable angina  In August 2016.  I reviewed his hospitalization as well as his catheterization findings.  His LAD stent is widely patent.  There is mild 50% intimal hyperplasia in the mid RCA stent with a patent distal stent.  He was recommended.  He has reduced LV function on echo Doppler study of 45-50% with moderate left trig hypertrophy and grade 1 diastolic dysfunction.   When I last saw him, his creatinine was 1.84. Spironolactone was discontinued and he was started on initial low-dose hydralazine. He had lost 15 pounds of weight from September to November 2016, but since that time has regained all the weight back.  His weight is increased today at 297, but he is wearing a boot on his right lower extremity accounting for some additional weight gain. He does have mild diastolic hypertension on exam today. He continues to be on Toprol-XL 100 mg isosorbide 60 mg furosemide 40 mg amlodipine/Alma Sartain 10/40 mg as well as hydralazine 25 mg twice a day. I have recommended further titration of his hydralazine to 37.5 mg twice a day.  I discussed with him the Pegasys trial data and will reduce his Brilinta to 60 mg twice a day from the present dose of 90 mg. He is fasting today.   Repeat blood work will be obtained to make certain his renal function remained stable.  I will also recheck his hemoglobin A1c. In October, his glucose was significantly out of control and his hemoglobin A1c was 14.2.  He has seen Dr. Michiel Sites on 2 occasions.  He now is on a 1 Humulin 70/30 regular insulin 40 units twice a day and is no longer taking long-acting insulin. Again discussed the importance of significant weight loss. He has obstructive sleep apnea.  He admits to 100% use.  He is unaware of breakthrough snoring. I will contact him regarding results of blood work.  He has a follow-up clinic to see Dr. Soyla Murphy in May.  I will see him in 6 months for cardiology reevaluation.   Time spent: 25 minutes  [name], MD, [facility]  02/16/2016 9:20 AM

## 2016-02-16 NOTE — Patient Instructions (Signed)
Your physician recommends that you return for lab work.  Your physician has recommended you make the following change in your medication:   1.) the hydralazine has been changed to 1 & 1/2 tablet twice a day.  2.) the Brilinta has been changed to 60 mg twice a day.  Your physician wants you to follow-up in: 6 months or sooner if needed. You will receive a reminder letter in the mail two months in advance. If you don't receive a letter, please call our office to schedule the follow-up appointment.

## 2016-02-19 DIAGNOSIS — E11621 Type 2 diabetes mellitus with foot ulcer: Secondary | ICD-10-CM | POA: Diagnosis not present

## 2016-02-26 DIAGNOSIS — E11621 Type 2 diabetes mellitus with foot ulcer: Secondary | ICD-10-CM | POA: Diagnosis not present

## 2016-03-03 ENCOUNTER — Telehealth: Payer: Self-pay | Admitting: *Deleted

## 2016-03-03 NOTE — Telephone Encounter (Signed)
-----   Message from [name], MD sent at 03/01/2016  9:34 AM EDT ----- Glu better, Tg high and Hb A1C; copy to his endocrine MD

## 2016-03-03 NOTE — Telephone Encounter (Signed)
Left detailed message of lab results on patient's VM. Copy of labs were sent to patient's PCP no endocrinologist on care team list.

## 2016-03-04 DIAGNOSIS — E11621 Type 2 diabetes mellitus with foot ulcer: Secondary | ICD-10-CM | POA: Diagnosis not present

## 2016-03-10 ENCOUNTER — Other Ambulatory Visit: Payer: Self-pay | Admitting: Cardiovascular Disease

## 2016-03-10 NOTE — Telephone Encounter (Signed)
Rx(s) sent to pharmacy electronically.  

## 2016-03-11 DIAGNOSIS — E11621 Type 2 diabetes mellitus with foot ulcer: Secondary | ICD-10-CM | POA: Diagnosis not present

## 2016-03-18 ENCOUNTER — Encounter (HOSPITAL_BASED_OUTPATIENT_CLINIC_OR_DEPARTMENT_OTHER): Payer: BLUE CROSS/BLUE SHIELD | Attending: Internal Medicine

## 2016-03-18 DIAGNOSIS — L84 Corns and callosities: Secondary | ICD-10-CM | POA: Diagnosis not present

## 2016-03-18 DIAGNOSIS — M86371 Chronic multifocal osteomyelitis, right ankle and foot: Secondary | ICD-10-CM | POA: Diagnosis not present

## 2016-03-18 DIAGNOSIS — L97512 Non-pressure chronic ulcer of other part of right foot with fat layer exposed: Secondary | ICD-10-CM | POA: Insufficient documentation

## 2016-03-18 DIAGNOSIS — L97521 Non-pressure chronic ulcer of other part of left foot limited to breakdown of skin: Secondary | ICD-10-CM | POA: Insufficient documentation

## 2016-03-18 DIAGNOSIS — Z794 Long term (current) use of insulin: Secondary | ICD-10-CM | POA: Insufficient documentation

## 2016-03-18 DIAGNOSIS — E11621 Type 2 diabetes mellitus with foot ulcer: Secondary | ICD-10-CM | POA: Diagnosis present

## 2016-03-23 ENCOUNTER — Other Ambulatory Visit: Payer: Self-pay | Admitting: Internal Medicine

## 2016-03-25 DIAGNOSIS — E11621 Type 2 diabetes mellitus with foot ulcer: Secondary | ICD-10-CM | POA: Diagnosis not present

## 2016-04-01 DIAGNOSIS — E11621 Type 2 diabetes mellitus with foot ulcer: Secondary | ICD-10-CM | POA: Diagnosis not present

## 2016-04-05 ENCOUNTER — Other Ambulatory Visit: Payer: Self-pay | Admitting: Podiatry

## 2016-04-05 LAB — GLUCOSE, CAPILLARY: Glucose-Capillary: 548 mg/dL — ABNORMAL HIGH (ref 65–99)

## 2016-04-06 ENCOUNTER — Other Ambulatory Visit: Payer: Self-pay | Admitting: Internal Medicine

## 2016-04-06 DIAGNOSIS — M79671 Pain in right foot: Secondary | ICD-10-CM

## 2016-04-08 DIAGNOSIS — E11621 Type 2 diabetes mellitus with foot ulcer: Secondary | ICD-10-CM | POA: Diagnosis not present

## 2016-04-11 ENCOUNTER — Ambulatory Visit
Admission: RE | Admit: 2016-04-11 | Discharge: 2016-04-11 | Disposition: A | Discharge status: Home / Self Care | Payer: BLUE CROSS/BLUE SHIELD | Source: Ambulatory Visit | Attending: Internal Medicine | Admitting: Internal Medicine

## 2016-04-11 DIAGNOSIS — M79671 Pain in right foot: Secondary | ICD-10-CM

## 2016-04-11 MED ORDER — GADOBENATE DIMEGLUMINE 529 MG/ML IV SOLN
20.0000 mL | Freq: Once | INTRAVENOUS | Status: AC | PRN
  Administered 2016-04-11: 20 mL via INTRAVENOUS

## 2016-04-15 ENCOUNTER — Encounter (HOSPITAL_BASED_OUTPATIENT_CLINIC_OR_DEPARTMENT_OTHER): Payer: BLUE CROSS/BLUE SHIELD | Attending: Internal Medicine

## 2016-04-15 DIAGNOSIS — L97512 Non-pressure chronic ulcer of other part of right foot with fat layer exposed: Secondary | ICD-10-CM | POA: Diagnosis not present

## 2016-04-15 DIAGNOSIS — I2510 Atherosclerotic heart disease of native coronary artery without angina pectoris: Secondary | ICD-10-CM | POA: Insufficient documentation

## 2016-04-15 DIAGNOSIS — E1140 Type 2 diabetes mellitus with diabetic neuropathy, unspecified: Secondary | ICD-10-CM | POA: Insufficient documentation

## 2016-04-15 DIAGNOSIS — I252 Old myocardial infarction: Secondary | ICD-10-CM | POA: Diagnosis not present

## 2016-04-15 DIAGNOSIS — E11621 Type 2 diabetes mellitus with foot ulcer: Secondary | ICD-10-CM | POA: Insufficient documentation

## 2016-04-15 DIAGNOSIS — G4730 Sleep apnea, unspecified: Secondary | ICD-10-CM | POA: Diagnosis not present

## 2016-04-22 DIAGNOSIS — E11621 Type 2 diabetes mellitus with foot ulcer: Secondary | ICD-10-CM | POA: Diagnosis not present

## 2016-04-29 DIAGNOSIS — E11621 Type 2 diabetes mellitus with foot ulcer: Secondary | ICD-10-CM | POA: Diagnosis not present

## 2016-05-06 DIAGNOSIS — E11621 Type 2 diabetes mellitus with foot ulcer: Secondary | ICD-10-CM | POA: Diagnosis not present

## 2016-05-13 ENCOUNTER — Encounter (HOSPITAL_BASED_OUTPATIENT_CLINIC_OR_DEPARTMENT_OTHER): Payer: BLUE CROSS/BLUE SHIELD | Attending: Internal Medicine

## 2016-05-13 DIAGNOSIS — L97512 Non-pressure chronic ulcer of other part of right foot with fat layer exposed: Secondary | ICD-10-CM | POA: Insufficient documentation

## 2016-05-13 DIAGNOSIS — E1169 Type 2 diabetes mellitus with other specified complication: Secondary | ICD-10-CM | POA: Insufficient documentation

## 2016-05-13 DIAGNOSIS — I252 Old myocardial infarction: Secondary | ICD-10-CM | POA: Insufficient documentation

## 2016-05-13 DIAGNOSIS — L97511 Non-pressure chronic ulcer of other part of right foot limited to breakdown of skin: Secondary | ICD-10-CM | POA: Insufficient documentation

## 2016-05-13 DIAGNOSIS — E11621 Type 2 diabetes mellitus with foot ulcer: Secondary | ICD-10-CM | POA: Insufficient documentation

## 2016-05-13 DIAGNOSIS — E1140 Type 2 diabetes mellitus with diabetic neuropathy, unspecified: Secondary | ICD-10-CM | POA: Insufficient documentation

## 2016-05-13 DIAGNOSIS — I2510 Atherosclerotic heart disease of native coronary artery without angina pectoris: Secondary | ICD-10-CM | POA: Insufficient documentation

## 2016-05-13 DIAGNOSIS — M869 Osteomyelitis, unspecified: Secondary | ICD-10-CM | POA: Insufficient documentation

## 2016-05-13 DIAGNOSIS — G4730 Sleep apnea, unspecified: Secondary | ICD-10-CM | POA: Insufficient documentation

## 2016-05-13 DIAGNOSIS — M86371 Chronic multifocal osteomyelitis, right ankle and foot: Secondary | ICD-10-CM | POA: Insufficient documentation

## 2016-05-13 DIAGNOSIS — M109 Gout, unspecified: Secondary | ICD-10-CM | POA: Insufficient documentation

## 2016-05-13 DIAGNOSIS — L84 Corns and callosities: Secondary | ICD-10-CM | POA: Insufficient documentation

## 2016-05-17 DIAGNOSIS — M869 Osteomyelitis, unspecified: Secondary | ICD-10-CM | POA: Diagnosis not present

## 2016-05-17 DIAGNOSIS — E11621 Type 2 diabetes mellitus with foot ulcer: Secondary | ICD-10-CM | POA: Diagnosis present

## 2016-05-17 DIAGNOSIS — E1169 Type 2 diabetes mellitus with other specified complication: Secondary | ICD-10-CM | POA: Diagnosis not present

## 2016-05-17 DIAGNOSIS — L84 Corns and callosities: Secondary | ICD-10-CM | POA: Diagnosis not present

## 2016-05-17 DIAGNOSIS — M86371 Chronic multifocal osteomyelitis, right ankle and foot: Secondary | ICD-10-CM | POA: Diagnosis not present

## 2016-05-17 DIAGNOSIS — I252 Old myocardial infarction: Secondary | ICD-10-CM | POA: Diagnosis not present

## 2016-05-17 DIAGNOSIS — L97512 Non-pressure chronic ulcer of other part of right foot with fat layer exposed: Secondary | ICD-10-CM | POA: Diagnosis not present

## 2016-05-17 DIAGNOSIS — L97511 Non-pressure chronic ulcer of other part of right foot limited to breakdown of skin: Secondary | ICD-10-CM | POA: Diagnosis not present

## 2016-05-17 DIAGNOSIS — M109 Gout, unspecified: Secondary | ICD-10-CM | POA: Diagnosis not present

## 2016-05-17 DIAGNOSIS — E1140 Type 2 diabetes mellitus with diabetic neuropathy, unspecified: Secondary | ICD-10-CM | POA: Diagnosis not present

## 2016-05-17 DIAGNOSIS — I2510 Atherosclerotic heart disease of native coronary artery without angina pectoris: Secondary | ICD-10-CM | POA: Diagnosis not present

## 2016-05-17 DIAGNOSIS — G4730 Sleep apnea, unspecified: Secondary | ICD-10-CM | POA: Diagnosis not present

## 2016-05-18 ENCOUNTER — Other Ambulatory Visit: Payer: Self-pay | Admitting: Cardiovascular Disease

## 2016-05-18 NOTE — Telephone Encounter (Signed)
Rx(s) sent to pharmacy electronically.  

## 2016-05-24 DIAGNOSIS — E11621 Type 2 diabetes mellitus with foot ulcer: Secondary | ICD-10-CM | POA: Diagnosis not present

## 2016-06-03 DIAGNOSIS — E11621 Type 2 diabetes mellitus with foot ulcer: Secondary | ICD-10-CM | POA: Diagnosis not present

## 2016-06-10 DIAGNOSIS — E11621 Type 2 diabetes mellitus with foot ulcer: Secondary | ICD-10-CM | POA: Diagnosis not present

## 2016-06-17 ENCOUNTER — Encounter (HOSPITAL_BASED_OUTPATIENT_CLINIC_OR_DEPARTMENT_OTHER): Payer: BLUE CROSS/BLUE SHIELD | Attending: Internal Medicine

## 2016-06-17 DIAGNOSIS — L97512 Non-pressure chronic ulcer of other part of right foot with fat layer exposed: Secondary | ICD-10-CM | POA: Diagnosis not present

## 2016-06-17 DIAGNOSIS — E1140 Type 2 diabetes mellitus with diabetic neuropathy, unspecified: Secondary | ICD-10-CM | POA: Diagnosis not present

## 2016-06-17 DIAGNOSIS — I2510 Atherosclerotic heart disease of native coronary artery without angina pectoris: Secondary | ICD-10-CM | POA: Diagnosis not present

## 2016-06-17 DIAGNOSIS — E11621 Type 2 diabetes mellitus with foot ulcer: Secondary | ICD-10-CM | POA: Diagnosis not present

## 2016-06-17 DIAGNOSIS — I252 Old myocardial infarction: Secondary | ICD-10-CM | POA: Insufficient documentation

## 2016-06-17 DIAGNOSIS — G4730 Sleep apnea, unspecified: Secondary | ICD-10-CM | POA: Insufficient documentation

## 2016-06-23 ENCOUNTER — Other Ambulatory Visit: Payer: Self-pay | Admitting: Family Medicine

## 2016-06-23 ENCOUNTER — Ambulatory Visit
Admission: RE | Admit: 2016-06-23 | Discharge: 2016-06-23 | Disposition: A | Discharge status: Home / Self Care | Payer: BLUE CROSS/BLUE SHIELD | Source: Ambulatory Visit | Attending: Family Medicine | Admitting: Family Medicine

## 2016-06-23 DIAGNOSIS — R0602 Shortness of breath: Secondary | ICD-10-CM

## 2016-06-25 DIAGNOSIS — E11621 Type 2 diabetes mellitus with foot ulcer: Secondary | ICD-10-CM | POA: Diagnosis not present

## 2016-07-02 DIAGNOSIS — E11621 Type 2 diabetes mellitus with foot ulcer: Secondary | ICD-10-CM | POA: Diagnosis not present

## 2016-07-09 DIAGNOSIS — E11621 Type 2 diabetes mellitus with foot ulcer: Secondary | ICD-10-CM | POA: Diagnosis not present

## 2016-07-16 ENCOUNTER — Other Ambulatory Visit (HOSPITAL_BASED_OUTPATIENT_CLINIC_OR_DEPARTMENT_OTHER): Payer: Self-pay | Admitting: General Surgery

## 2016-07-16 ENCOUNTER — Encounter (HOSPITAL_BASED_OUTPATIENT_CLINIC_OR_DEPARTMENT_OTHER): Payer: BLUE CROSS/BLUE SHIELD | Attending: Internal Medicine

## 2016-07-16 ENCOUNTER — Ambulatory Visit (HOSPITAL_COMMUNITY)
Admission: RE | Admit: 2016-07-16 | Discharge: 2016-07-16 | Disposition: A | Discharge status: Home / Self Care | Payer: BLUE CROSS/BLUE SHIELD | Source: Ambulatory Visit | Attending: General Surgery | Admitting: General Surgery

## 2016-07-16 DIAGNOSIS — E1140 Type 2 diabetes mellitus with diabetic neuropathy, unspecified: Secondary | ICD-10-CM | POA: Diagnosis not present

## 2016-07-16 DIAGNOSIS — E11621 Type 2 diabetes mellitus with foot ulcer: Secondary | ICD-10-CM | POA: Insufficient documentation

## 2016-07-16 DIAGNOSIS — L97511 Non-pressure chronic ulcer of other part of right foot limited to breakdown of skin: Secondary | ICD-10-CM | POA: Diagnosis not present

## 2016-07-16 DIAGNOSIS — G4730 Sleep apnea, unspecified: Secondary | ICD-10-CM | POA: Insufficient documentation

## 2016-07-16 DIAGNOSIS — I2510 Atherosclerotic heart disease of native coronary artery without angina pectoris: Secondary | ICD-10-CM | POA: Insufficient documentation

## 2016-07-16 DIAGNOSIS — M869 Osteomyelitis, unspecified: Secondary | ICD-10-CM

## 2016-07-16 DIAGNOSIS — I252 Old myocardial infarction: Secondary | ICD-10-CM | POA: Insufficient documentation

## 2016-07-16 DIAGNOSIS — L97512 Non-pressure chronic ulcer of other part of right foot with fat layer exposed: Secondary | ICD-10-CM | POA: Insufficient documentation

## 2016-07-18 ENCOUNTER — Emergency Department (HOSPITAL_COMMUNITY): Payer: BLUE CROSS/BLUE SHIELD

## 2016-07-18 ENCOUNTER — Encounter (HOSPITAL_COMMUNITY): Payer: Self-pay | Admitting: *Deleted

## 2016-07-18 ENCOUNTER — Inpatient Hospital Stay (HOSPITAL_COMMUNITY)
Admission: EM | Admit: 2016-07-18 | Discharge: 2016-08-03 | DRG: 240 | Disposition: A | Discharge status: Rehabilitation Facility | Payer: BLUE CROSS/BLUE SHIELD | Attending: Cardiovascular Disease | Admitting: Cardiovascular Disease

## 2016-07-18 ENCOUNTER — Telehealth: Payer: Self-pay | Admitting: Physician Assistant

## 2016-07-18 DIAGNOSIS — I110 Hypertensive heart disease with heart failure: Secondary | ICD-10-CM | POA: Diagnosis present

## 2016-07-18 DIAGNOSIS — I252 Old myocardial infarction: Secondary | ICD-10-CM

## 2016-07-18 DIAGNOSIS — N183 Chronic kidney disease, stage 3 unspecified: Secondary | ICD-10-CM

## 2016-07-18 DIAGNOSIS — N179 Acute kidney failure, unspecified: Secondary | ICD-10-CM | POA: Diagnosis not present

## 2016-07-18 DIAGNOSIS — E1069 Type 1 diabetes mellitus with other specified complication: Secondary | ICD-10-CM | POA: Diagnosis present

## 2016-07-18 DIAGNOSIS — K3184 Gastroparesis: Secondary | ICD-10-CM | POA: Diagnosis present

## 2016-07-18 DIAGNOSIS — I2510 Atherosclerotic heart disease of native coronary artery without angina pectoris: Secondary | ICD-10-CM | POA: Diagnosis present

## 2016-07-18 DIAGNOSIS — I5042 Chronic combined systolic (congestive) and diastolic (congestive) heart failure: Secondary | ICD-10-CM | POA: Diagnosis present

## 2016-07-18 DIAGNOSIS — L03119 Cellulitis of unspecified part of limb: Secondary | ICD-10-CM

## 2016-07-18 DIAGNOSIS — Z89421 Acquired absence of other right toe(s): Secondary | ICD-10-CM

## 2016-07-18 DIAGNOSIS — L97519 Non-pressure chronic ulcer of other part of right foot with unspecified severity: Secondary | ICD-10-CM | POA: Diagnosis present

## 2016-07-18 DIAGNOSIS — E1065 Type 1 diabetes mellitus with hyperglycemia: Secondary | ICD-10-CM | POA: Diagnosis present

## 2016-07-18 DIAGNOSIS — N1832 Chronic kidney disease, stage 3b: Secondary | ICD-10-CM

## 2016-07-18 DIAGNOSIS — S81802A Unspecified open wound, left lower leg, initial encounter: Secondary | ICD-10-CM

## 2016-07-18 DIAGNOSIS — R079 Chest pain, unspecified: Secondary | ICD-10-CM | POA: Diagnosis not present

## 2016-07-18 DIAGNOSIS — S91331A Puncture wound without foreign body, right foot, initial encounter: Secondary | ICD-10-CM

## 2016-07-18 DIAGNOSIS — R5381 Other malaise: Secondary | ICD-10-CM | POA: Diagnosis not present

## 2016-07-18 DIAGNOSIS — Z6841 Body Mass Index (BMI) 40.0 and over, adult: Secondary | ICD-10-CM

## 2016-07-18 DIAGNOSIS — I878 Other specified disorders of veins: Secondary | ICD-10-CM | POA: Diagnosis present

## 2016-07-18 DIAGNOSIS — Z794 Long term (current) use of insulin: Secondary | ICD-10-CM

## 2016-07-18 DIAGNOSIS — Z79899 Other long term (current) drug therapy: Secondary | ICD-10-CM

## 2016-07-18 DIAGNOSIS — I5032 Chronic diastolic (congestive) heart failure: Secondary | ICD-10-CM | POA: Diagnosis present

## 2016-07-18 DIAGNOSIS — I25110 Atherosclerotic heart disease of native coronary artery with unstable angina pectoris: Principal | ICD-10-CM | POA: Diagnosis present

## 2016-07-18 DIAGNOSIS — K219 Gastro-esophageal reflux disease without esophagitis: Secondary | ICD-10-CM | POA: Diagnosis present

## 2016-07-18 DIAGNOSIS — I200 Unstable angina: Secondary | ICD-10-CM | POA: Diagnosis not present

## 2016-07-18 DIAGNOSIS — E1022 Type 1 diabetes mellitus with diabetic chronic kidney disease: Secondary | ICD-10-CM | POA: Diagnosis present

## 2016-07-18 DIAGNOSIS — D62 Acute posthemorrhagic anemia: Secondary | ICD-10-CM | POA: Diagnosis not present

## 2016-07-18 DIAGNOSIS — G4733 Obstructive sleep apnea (adult) (pediatric): Secondary | ICD-10-CM | POA: Diagnosis present

## 2016-07-18 DIAGNOSIS — E1143 Type 2 diabetes mellitus with diabetic autonomic (poly)neuropathy: Secondary | ICD-10-CM | POA: Diagnosis present

## 2016-07-18 DIAGNOSIS — E10621 Type 1 diabetes mellitus with foot ulcer: Secondary | ICD-10-CM | POA: Diagnosis present

## 2016-07-18 DIAGNOSIS — Z7902 Long term (current) use of antithrombotics/antiplatelets: Secondary | ICD-10-CM

## 2016-07-18 DIAGNOSIS — N289 Disorder of kidney and ureter, unspecified: Secondary | ICD-10-CM

## 2016-07-18 DIAGNOSIS — M86671 Other chronic osteomyelitis, right ankle and foot: Secondary | ICD-10-CM

## 2016-07-18 DIAGNOSIS — L03115 Cellulitis of right lower limb: Secondary | ICD-10-CM | POA: Diagnosis present

## 2016-07-18 DIAGNOSIS — L02419 Cutaneous abscess of limb, unspecified: Secondary | ICD-10-CM | POA: Diagnosis present

## 2016-07-18 DIAGNOSIS — L02415 Cutaneous abscess of right lower limb: Secondary | ICD-10-CM | POA: Diagnosis present

## 2016-07-18 DIAGNOSIS — I130 Hypertensive heart and chronic kidney disease with heart failure and stage 1 through stage 4 chronic kidney disease, or unspecified chronic kidney disease: Secondary | ICD-10-CM | POA: Diagnosis present

## 2016-07-18 DIAGNOSIS — M109 Gout, unspecified: Secondary | ICD-10-CM | POA: Diagnosis present

## 2016-07-18 DIAGNOSIS — Z9861 Coronary angioplasty status: Secondary | ICD-10-CM

## 2016-07-18 DIAGNOSIS — E785 Hyperlipidemia, unspecified: Secondary | ICD-10-CM | POA: Diagnosis present

## 2016-07-18 DIAGNOSIS — Z7982 Long term (current) use of aspirin: Secondary | ICD-10-CM

## 2016-07-18 LAB — GLUCOSE, CAPILLARY: Glucose-Capillary: 157 mg/dL — ABNORMAL HIGH (ref 65–99)

## 2016-07-18 LAB — CBC
HCT: 40.8 % (ref 39.0–52.0)
Hemoglobin: 13 g/dL (ref 13.0–17.0)
MCH: 28.4 pg (ref 26.0–34.0)
MCHC: 31.9 g/dL (ref 30.0–36.0)
MCV: 89.1 fL (ref 78.0–100.0)
Platelets: 166 10*3/uL (ref 150–400)
RBC: 4.58 MIL/uL (ref 4.22–5.81)
RDW: 14.7 % (ref 11.5–15.5)
WBC: 10.4 10*3/uL (ref 4.0–10.5)

## 2016-07-18 LAB — I-STAT TROPONIN, ED
Troponin i, poc: 0.08 ng/mL (ref 0.00–0.08)
Troponin i, poc: 0.22 ng/mL (ref 0.00–0.08)

## 2016-07-18 LAB — BASIC METABOLIC PANEL
Anion gap: 8 (ref 5–15)
BUN: 14 mg/dL (ref 6–20)
CO2: 22 mmol/L (ref 22–32)
Calcium: 8.4 mg/dL — ABNORMAL LOW (ref 8.9–10.3)
Chloride: 104 mmol/L (ref 101–111)
Creatinine, Ser: 1.34 mg/dL — ABNORMAL HIGH (ref 0.61–1.24)
GFR calc Af Amer: >60 mL/min (ref >60)
GFR calc non Af Amer: 57 mL/min — ABNORMAL LOW (ref >60)
Glucose, Bld: 200 mg/dL — ABNORMAL HIGH (ref 65–99)
Potassium: 3.7 mmol/L (ref 3.5–5.1)
Sodium: 134 mmol/L — ABNORMAL LOW (ref 135–145)

## 2016-07-18 MED ORDER — NITROGLYCERIN IN D5W 200-5 MCG/ML-% IV SOLN
3.0000 ug/min | INTRAVENOUS | Status: DC
Start: 2016-07-18 — End: 2016-07-28
  Administered 2016-07-19 – 2016-07-27 (×6): 15 ug/min via INTRAVENOUS
  Filled 2016-07-18 (×4): qty 250

## 2016-07-18 MED ORDER — NITROGLYCERIN 0.4 MG SL SUBL: 0.4000 mg | SUBLINGUAL_TABLET | SUBLINGUAL | Status: DC | PRN

## 2016-07-18 MED ORDER — PANTOPRAZOLE SODIUM 40 MG PO TBEC
40.0000 mg | DELAYED_RELEASE_TABLET | Freq: Every day | ORAL | Status: DC
  Administered 2016-07-19 – 2016-08-03 (×14): 40 mg via ORAL
  Filled 2016-07-18 (×14): qty 1

## 2016-07-18 MED ORDER — SODIUM CHLORIDE 0.9% FLUSH: 3.0000 mL | Freq: Two times a day (BID) | INTRAVENOUS | Status: DC

## 2016-07-18 MED ORDER — IRBESARTAN 300 MG PO TABS
300.0000 mg | ORAL_TABLET | Freq: Every day | ORAL | Status: DC
  Administered 2016-07-19: 300 mg via ORAL
  Filled 2016-07-18: qty 1

## 2016-07-18 MED ORDER — AMLODIPINE-OLMESARTAN 10-40 MG PO TABS: 1.0000 | ORAL_TABLET | Freq: Every day | ORAL | Status: DC

## 2016-07-18 MED ORDER — HYDRALAZINE HCL 25 MG PO TABS
37.5000 mg | ORAL_TABLET | Freq: Two times a day (BID) | ORAL | Status: DC
  Administered 2016-07-19 – 2016-08-03 (×31): 37.5 mg via ORAL
  Filled 2016-07-18 (×32): qty 2

## 2016-07-18 MED ORDER — AMLODIPINE BESYLATE 10 MG PO TABS
10.0000 mg | ORAL_TABLET | Freq: Every day | ORAL | Status: DC
  Administered 2016-07-19 – 2016-08-03 (×15): 10 mg via ORAL
  Filled 2016-07-18 (×3): qty 1
  Filled 2016-07-18: qty 2
  Filled 2016-07-18 (×11): qty 1

## 2016-07-18 MED ORDER — INSULIN NPH (HUMAN) (ISOPHANE) 100 UNIT/ML ~~LOC~~ SUSP: 50.0000 [IU] | SUBCUTANEOUS | Status: DC

## 2016-07-18 MED ORDER — ALLOPURINOL 300 MG PO TABS
300.0000 mg | ORAL_TABLET | Freq: Every day | ORAL | Status: DC
  Administered 2016-07-19 – 2016-08-03 (×14): 300 mg via ORAL
  Filled 2016-07-18 (×12): qty 1
  Filled 2016-07-18: qty 3
  Filled 2016-07-18: qty 1

## 2016-07-18 MED ORDER — ROSUVASTATIN CALCIUM 10 MG PO TABS
40.0000 mg | ORAL_TABLET | Freq: Every day | ORAL | Status: DC
  Administered 2016-07-19 – 2016-07-22 (×4): 40 mg via ORAL
  Filled 2016-07-18 (×4): qty 4

## 2016-07-18 MED ORDER — SODIUM CHLORIDE 0.9 % IV SOLN: 250.0000 mL | INTRAVENOUS | Status: DC | PRN

## 2016-07-18 MED ORDER — FUROSEMIDE 40 MG PO TABS
40.0000 mg | ORAL_TABLET | Freq: Every day | ORAL | Status: DC
  Administered 2016-07-19 – 2016-07-22 (×4): 40 mg via ORAL
  Filled 2016-07-18 (×4): qty 1

## 2016-07-18 MED ORDER — METOPROLOL SUCCINATE ER 50 MG PO TB24
50.0000 mg | ORAL_TABLET | Freq: Every day | ORAL | Status: DC
  Administered 2016-07-21 – 2016-07-26 (×5): 50 mg via ORAL
  Filled 2016-07-18 (×7): qty 1

## 2016-07-18 MED ORDER — HEPARIN (PORCINE) IN NACL 100-0.45 UNIT/ML-% IJ SOLN
1450.0000 [IU]/h | INTRAMUSCULAR | Status: DC
  Administered 2016-07-18: 1350 [IU]/h via INTRAVENOUS
  Administered 2016-07-19: 1450 [IU]/h via INTRAVENOUS
  Filled 2016-07-18 (×2): qty 250

## 2016-07-18 MED ORDER — ONDANSETRON HCL 4 MG/2ML IJ SOLN: 4.0000 mg | Freq: Four times a day (QID) | INTRAMUSCULAR | Status: DC | PRN

## 2016-07-18 MED ORDER — NITROGLYCERIN IN D5W 200-5 MCG/ML-% IV SOLN
10.0000 ug/min | INTRAVENOUS | Status: DC
  Administered 2016-07-18: 10 ug/min via INTRAVENOUS
  Filled 2016-07-18: qty 250

## 2016-07-18 MED ORDER — ACETAMINOPHEN 325 MG PO TABS: 650.0000 mg | ORAL_TABLET | ORAL | Status: DC | PRN

## 2016-07-18 MED ORDER — SODIUM CHLORIDE 0.9% FLUSH: 3.0000 mL | INTRAVENOUS | Status: DC | PRN

## 2016-07-18 MED ORDER — ISOSORBIDE MONONITRATE ER 60 MG PO TB24
60.0000 mg | ORAL_TABLET | Freq: Every day | ORAL | Status: DC
  Administered 2016-07-19 – 2016-07-27 (×8): 60 mg via ORAL
  Filled 2016-07-18 (×9): qty 1

## 2016-07-18 MED ORDER — ASPIRIN EC 81 MG PO TBEC
81.0000 mg | DELAYED_RELEASE_TABLET | Freq: Every day | ORAL | Status: DC
  Administered 2016-07-19: 81 mg via ORAL
  Filled 2016-07-18: qty 1

## 2016-07-18 MED ORDER — DOXAZOSIN MESYLATE 8 MG PO TABS
8.0000 mg | ORAL_TABLET | Freq: Every day | ORAL | Status: DC
  Administered 2016-07-19 – 2016-08-03 (×15): 8 mg via ORAL
  Filled 2016-07-18 (×2): qty 4
  Filled 2016-07-18 (×4): qty 1
  Filled 2016-07-18 (×4): qty 4
  Filled 2016-07-18 (×3): qty 1
  Filled 2016-07-18 (×3): qty 4
  Filled 2016-07-18 (×2): qty 1
  Filled 2016-07-18 (×4): qty 4

## 2016-07-18 MED ORDER — INSULIN NPH (HUMAN) (ISOPHANE) 100 UNIT/ML ~~LOC~~ SUSP
60.0000 [IU] | Freq: Every day | SUBCUTANEOUS | Status: DC
  Administered 2016-07-19: 60 [IU] via SUBCUTANEOUS
  Filled 2016-07-18: qty 10

## 2016-07-18 MED ORDER — IOPAMIDOL (ISOVUE-370) INJECTION 76%
INTRAVENOUS | Status: AC
  Administered 2016-07-18: 100 mL
  Filled 2016-07-18: qty 100

## 2016-07-18 MED ORDER — INSULIN ASPART 100 UNIT/ML ~~LOC~~ SOLN
0.0000 [IU] | Freq: Three times a day (TID) | SUBCUTANEOUS | Status: DC
  Administered 2016-07-20: 3 [IU] via SUBCUTANEOUS
  Administered 2016-07-20: 4 [IU] via SUBCUTANEOUS
  Administered 2016-07-21: 7 [IU] via SUBCUTANEOUS
  Administered 2016-07-22 – 2016-07-24 (×5): 4 [IU] via SUBCUTANEOUS
  Administered 2016-07-24: 7 [IU] via SUBCUTANEOUS
  Administered 2016-07-25: 4 [IU] via SUBCUTANEOUS
  Administered 2016-07-25: 3 [IU] via SUBCUTANEOUS
  Administered 2016-07-25 – 2016-07-26 (×2): 4 [IU] via SUBCUTANEOUS
  Administered 2016-07-26 (×2): 7 [IU] via SUBCUTANEOUS
  Administered 2016-07-27: 3 [IU] via SUBCUTANEOUS
  Administered 2016-07-27 (×2): 4 [IU] via SUBCUTANEOUS
  Administered 2016-07-28 – 2016-07-30 (×3): 3 [IU] via SUBCUTANEOUS
  Administered 2016-07-31: 7 [IU] via SUBCUTANEOUS
  Administered 2016-07-31 – 2016-08-01 (×3): 4 [IU] via SUBCUTANEOUS
  Administered 2016-08-01: 11 [IU] via SUBCUTANEOUS
  Administered 2016-08-02: 3 [IU] via SUBCUTANEOUS
  Administered 2016-08-02 – 2016-08-03 (×2): 7 [IU] via SUBCUTANEOUS

## 2016-07-18 MED ORDER — HEPARIN BOLUS VIA INFUSION
4000.0000 [IU] | Freq: Once | INTRAVENOUS | Status: AC
  Administered 2016-07-18: 4000 [IU] via INTRAVENOUS
  Filled 2016-07-18: qty 4000

## 2016-07-18 MED ORDER — ASPIRIN 81 MG PO CHEW
324.0000 mg | CHEWABLE_TABLET | Freq: Once | ORAL | Status: AC
Start: 2016-07-18 — End: 2016-07-18
  Administered 2016-07-18: 324 mg via ORAL
  Filled 2016-07-18: qty 4

## 2016-07-18 MED ORDER — INSULIN NPH (HUMAN) (ISOPHANE) 100 UNIT/ML ~~LOC~~ SUSP
50.0000 [IU] | Freq: Every day | SUBCUTANEOUS | Status: DC
  Administered 2016-07-19 – 2016-07-20 (×2): 50 [IU] via SUBCUTANEOUS
  Filled 2016-07-18: qty 10

## 2016-07-18 NOTE — Progress Notes (Signed)
ANTICOAGULATION CONSULT NOTE - Initial Consult  Pharmacy Consult for heparin Indication: chest pain/ACS  No Known Allergies  Patient Measurements: Height: 5\' 9"  (175.3 cm) Weight: (!) 317 lb (143.8 kg) IBW/kg (Calculated) : 70.7 Heparin Dosing Weight: 105 kg  Vital Signs: Temp: 98.4 F (36.9 C) (08/06 1616) Temp Source: Oral (08/06 1616) BP: 135/88 (08/06 1700) Pulse Rate: 100 (08/06 1700)  Labs:  Recent Labs  07/18/16 1612  HGB 13.0  HCT 40.8  PLT 166  CREATININE 1.34*    Estimated Creatinine Clearance: 85.9 mL/min (by C-G formula based on SCr of 1.34 mg/dL).  Assessment: 60 yo with CP x several days, SOB, N  PMH: CAD, HTN, HLD, Dm2  AC: none pta. IV hep for r/o ACS  Renal: SCr 1.34  Heme: H&H 13/40.8, Plt 166  Goal of Therapy:  Heparin level 0.3-0.7 units/ml Monitor platelets by anticoagulation protocol: Yes   Plan:  Heparin 4000 unit bolus Heparin 1350 units/hr HL 0000 Daily HL, CBC Monitor for s/sx of bleeding  Levester Fresh, PharmD, BCPS, [facility] Clinical Pharmacist Pager [phone] 07/18/2016 5:22 PM

## 2016-07-18 NOTE — Telephone Encounter (Signed)
60 yo male with past medical history of hypertension, hyperlipidemia, diabetes and CAD. He had history of PCI to mid LAD and RCA in November 2012. The last cardiac catheterization was in October 2016 which showed no obstructive disease. According to the patient, he has been having intermittent chest pain for the past several days. He also complaining of episodic shortness of breath as well. He says the chest pain has been getting worse and he had more intense chest pain today.. I have advised the patient to seek medical attention at local ED.  Joshua Corrigan PA Pager: [phone]

## 2016-07-18 NOTE — ED Triage Notes (Signed)
Pt has cardiac history. Reports having discomfort at night for several days, became more severe last night and this afternoon.  Describes pain as burning tightness. Having mild sob and nausea today. Took one nitro at home and that helped decrease pain.

## 2016-07-18 NOTE — ED Provider Notes (Signed)
[facility] DEPT Provider Note   CSN: [id number] Arrival date & time: 07/18/16  1604  First Provider Contact:  04/17/2016 1702      History   Chief Complaint Chief Complaint  Patient presents with  . Chest Pain    HPI Joshua [name] is a 60 y.o. male.  The history is provided by the patient.  Chest Pain    He has history of cardiac stents and also history of combined systolic and diastolic heart failure. For the last 2 weeks, he has been having episodes of nocturnal chest pain. He has difficulty describing it but it is left-sided. It would last for anywhere from 5-15 minutes. It seemed to get better if he sat up and if he walked around. Today, he had an episode of pain which occurred while he was awake. Pain seemed to be worse when he lay down. He rated pain at 7/10. There was slight associated dyspnea nausea but no diaphoresis. He did take a nitroglycerin with significant improvement in the pain. Since then, he has a dull pressure which waxes and wanes but does not go away completely. He took aspirin 81 mg at home.  Past Medical History:  Diagnosis Date  . Chronic combined systolic and diastolic CHF (congestive heart failure) ([place])    a. 07/2015 Echo: EF 45-50%, mod LVH, mid apical/antsept AK, Gr 1 DD.  [name] Coronary artery disease    a. 2012 s/p MI/PCI: s/p DES to mid/dist RCA and overlapping DES to LAD;  b. 07/2015 Cath: LAD 10% ISR, D1 40(jailed), LCX 48m, OM2 30, OM3 30, RCA 25m ISR, 10d ISR.  . Diabetic gastroparesis ([place])   . Essential hypertension   . GERD (gastroesophageal reflux disease)   . Gout   . Heart murmur   . Hyperlipemia   . Morbid obesity ([place])   . Myocardial infarction ([place]) 2012  . OSA on CPAP   . Polyneuropathy in diabetes(357.2)   . Pulmonary sarcoidosis ([place])    "problems w/it years ago; no problems anymore" (07/03/2014)  . Type II diabetes mellitus [facility])     Patient Active Problem List   Diagnosis Date Noted  . Type II diabetes mellitus ([place])   .  Chronic combined systolic and diastolic CHF (congestive heart failure) ([place])   . Coronary artery disease   . Hyperlipemia   . Chest pain 07/31/2015  . Coronary artery disease involving native coronary artery of native heart without angina pectoris   . Angina pectoris ([place])   . Acute diastolic heart failure ([place])   . Intermediate coronary syndrome ([place]) 07/03/2014  . Abnormal nuclear stress test 07/03/2014  . Chest pain with moderate risk of acute coronary syndrome 06/24/2014  . CAD in native artery 06/24/2014  . Sleep apnea- on C-pap 06/24/2014  . Morbid obesity ([place]) 11/10/2013  . Abscess of fifth finger 10/29/2013  . Uncontrolled diabetes mellitus with peripheral autonomic neuropathy ([place]) 10/29/2013  . Hyperglycemia 10/29/2013  . STEMI (ST elevation myocardial infarction) ([place]) 10/31/2011  . Presence of stent in anterior descending branch of left coronary artery 10/31/2011  . Hyperlipidemia 01/12/2011  . Essential hypertension 01/12/2011  . GERD 01/12/2011  . ACUTE OSTEOMYELITIS, ANKLE AND FOOT 01/12/2011    Past Surgical History:  Procedure Laterality Date  . BREAST LUMPECTOMY Right ~ 2000   "benign"  . CARDIAC CATHETERIZATION N/A 07/30/2015   Procedure: Left Heart Cath and Coronary Angiography;  Surgeon: Burnell Blanks, MD;  Location: [facility] CV LAB;  Service: Cardiovascular;  Laterality: N/A;  .  CATARACT EXTRACTION W/ INTRAOCULAR LENS  IMPLANT, BILATERAL Bilateral 2014-2015  . CORONARY ANGIOPLASTY WITH STENT PLACEMENT  10/31/2011   30 % MID ATRIOVENTRICULAR GROOVE CX STENOSIS. 90 % MID RCA.PTA TO LAD/STENTING OF TANDEM 60-70%. LAD STENOSIS 99% REDUCED TO 0%.3.0 X 32MM PROMUS DES POSTDILATED TO 3.25MM.  [name] CORONARY ANGIOPLASTY WITH STENT PLACEMENT  07/03/2014   "2"  . DOPPLER ECHOCARDIOGRAPHY N/A 11-23-2011   TECHNICALLY DIFFICULT. LV GROSSLY NORMAL IN SIZE. LV SYSTOLIC FUNCTION IS MILDLY REDUCED. EF=45-50%. DOPPLER FLOW PATTERN WAS NORMAL FOR AGE. MILD  . I&D  EXTREMITY Right 10/30/2013   Procedure: IRRIGATION AND DEBRIDEMENT RIGHT SMALL FINGER POSSIBLE SECONDARY WOUND CLOSURE;  Surgeon: [name], MD;  Location: [facility];  Service: Orthopedics;  Laterality: Right;  [name] is available after 4pm  . I&D EXTREMITY Right 11/01/2013   Procedure:  IRRIGATION AND DEBRIDEMENT RIGHT  PROXIMAL PHALANGEAL LEVEL AMPUTATION;  Surgeon: [name], MD;  Location: [facility];  Service: Orthopedics;  Laterality: Right;  . INCISION AND DRAINAGE Right 10/28/2013   Procedure: INCISION AND DRAINAGE Right small finger;  Surgeon: [name], MD;  Location: [facility];  Service: Orthopedics;  Laterality: Right;  . LEFT HEART CATH Left 11/03/2011   Procedure: LEFT HEART CATH;  Surgeon: [name], MD;  Location: [facility] CATH LAB;  Service: Cardiovascular;  Laterality: Left;  . LEFT HEART CATHETERIZATION WITH CORONARY ANGIOGRAM N/A 07/03/2014   Procedure: LEFT HEART CATHETERIZATION WITH CORONARY ANGIOGRAM;  Surgeon: [name], MD;  Location[facility] C[facility] LAB;  Service: Cardiovascular;  Laterality: N/A;  . nuclear stress test N/A 12-29-2011   MILD PERFUSION DEFECT IS SEEN IN THE APICAL SEPTAL AMD MID INFEROSEPTAL REGIONS. THIS IS CONSISTANT WITH INFARCT/SCAR. NO EVIDENCE OF INDUCIBLE ISCHEMIA. EF 41%. LV SYSTOLIC FUNCTION IS MODERATELY REDUCED.   [name] PERCUTANEOUS CORONARY STENT INTERVENTION (PCI-S) Left 11/03/2011   Procedure: PERCUTANEOUS CORONARY STENT INTERVENTION (PCI-S);  Surgeon: [name], MD;  Location: [facility] CATH LAB;  Service: Cardiovascular;  Laterality: Left;  . TOE AMPUTATION Right ~ 2010   "2nd toe"  . TOE AMPUTATION Right 2012   "3rd toe"       Home Medications    Prior to Admission medications   Medication Sig Start Date End Date Taking? Authorizing Provider  allopurinol (ZYLOPRIM) 300 MG tablet Take 300 mg by mouth daily.    Yes Historical Provider, MD  amLODipine-olmesartan (AZOR) 10-40 MG tablet Take 1 tablet by mouth daily. 03/10/16   Yes [name], MD  aspirin EC 81 MG tablet Take 81 mg by mouth daily.   Yes Historical Provider, MD  doxazosin (CARDURA) 8 MG tablet Take 8 mg by mouth daily.  03/21/15  Yes Historical Provider, MD  furosemide (LASIX) 40 MG tablet TAKE 1 TABLET (40 MG TOTAL) BY MOUTH DAILY. 05/18/16  Yes [name], MD  hydrALAZINE (APRESOLINE) 25 MG tablet Take 1.5 tablets (37.5 mg total) by mouth 2 (two) times daily. Patient taking differently: Take 25 mg by mouth 2 (two) times daily.  02/16/16  Yes [name], MD  isosorbide mononitrate (IMDUR) 60 MG 24 hr tablet Take 1 tablet (60 mg total) by mouth daily. 08/08/15  Yes Rogelia Mire, NP  nitroGLYCERIN (NITROSTAT) 0.4 MG SL tablet Place 1 tablet (0.4 mg total) under the tongue every 5 (five) minutes as needed for chest pain. 10/19/13  Yes [name], MD  NOVOLIN N RELION 100 UNIT/ML injection Inject 50-60 Units into the skin See admin instructions. [place]  units in the morning and 60 units in the evening 06/25/16  Yes Historical Provider, MD  NOVOLIN R RELION 100 UNIT/ML injection Inject 0-10 Units into the skin 3 (three) times daily before meals. Per sliding scale 06/25/16  Yes Historical Provider, MD  pantoprazole (PROTONIX) 40 MG tablet Take 1 tablet (40 mg total) by mouth daily. 09/02/15  Yes [name], MD  rosuvastatin (CRESTOR) 40 MG tablet Take 1 tablet (40 mg total) by mouth daily. 11/02/13  Yes Janece Canterbury, MD  ticagrelor (BRILINTA) 60 MG TABS tablet Take 1 tablet (60 mg total) by mouth 2 (two) times daily. 02/16/16  Yes [name], MD  insulin NPH-regular (HUMULIN 70/30) (70-30) 100 UNIT/ML injection Inject 40 Units into the skin 2 (two) times daily. Patient not taking: Reported on 07/18/2016 11/02/13   Janece Canterbury, MD  metoprolol succinate (TOPROL-XL) 100 MG 24 hr tablet Take 1 tablet (100 mg total) by mouth daily. Take with or immediately following a meal. Patient not taking: Reported on 07/18/2016 10/09/13   [name], MD     Family History Family History  Problem Relation Age of Onset  . Heart attack Maternal Aunt   . Diabetes Maternal Grandmother   . Hypertension Maternal Grandmother     Social History Social History  Substance Use Topics  . Smoking status: Never Smoker  . Smokeless tobacco: Never Used  . Alcohol use Yes     Comment: Pt sts he binge drinks on the weekends     Allergies   Review of patient's allergies indicates no known allergies.   Review of Systems Review of Systems  Cardiovascular: Positive for chest pain.  All other systems reviewed and are negative.    Physical Exam Updated Vital Signs BP 135/88   Pulse 100   Temp 98.4 F (36.9 C) (Oral)   Resp 17   Ht $R'5\' 9"'nW$  (1.753 m)   Wt (!) 317 lb (143.8 kg)   SpO2 97%   BMI 46.81 kg/m   Physical Exam  Nursing note and vitals reviewed.  Obese 60 year old male, resting comfortably and in no acute distress. Vital signs are normal. Oxygen saturation is 97%, which is normal. Head is normocephalic and atraumatic. PERRLA, EOMI. Oropharynx is clear. Neck is nontender and supple without adenopathy or JVD. Back is nontender and there is no CVA tenderness. Lungs are clear without rales, wheezes, or rhonchi. Chest is nontender. Heart has regular rate and rhythm without murmur. Abdomen is soft, flat, nontender without masses or hepatosplenomegaly and peristalsis is normoactive. Extremities have 1+ edema with moderate venous stasis changes, full range of motion is present. He has a postop shoe on his right foot because of a diabetic ulcer. Right calf circumference is 2 cm greater than left calf circumference. Skin is warm and dry without rash. Neurologic: Mental status is normal, cranial nerves are intact, there are no motor or sensory deficits.   ED Treatments / Results  Labs (all labs ordered are listed, but only abnormal results are displayed) Labs Reviewed  BASIC METABOLIC PANEL - Abnormal; Notable for the following:        Result Value   Sodium 134 (*)    Glucose, Bld 200 (*)    Creatinine, Ser 1.34 (*)    Calcium 8.4 (*)    GFR calc non Af Amer 57 (*)    All other components within normal limits  CBC  HEPARIN LEVEL (UNFRACTIONATED)  HEPARIN LEVEL (UNFRACTIONATED)  CBC  I-STAT TROPOININ, ED  Randolm Idol, ED    EKG  EKG Interpretation  Date/Time:  Sunday July 18 2016 16:14:52 EDT Ventricular Rate:  114 PR Interval:  186 QRS Duration: 70 QT Interval:  310 QTC Calculation: 427 R Axis:   78 Text Interpretation:  Sinus tachycardia Septal infarct , age undetermined Abnormal ECG When compared with ECG of 07/30/2015, There is increase in anterolateral forces, possibly from change in lead placement Confirmed by [facility]  MD, Kimara Bencomo ([id number]) on 07/18/2016 5:02:08 PM       Radiology Dg Chest 2 View  Result Date: 07/18/2016 CLINICAL DATA:  Left-sided chest pain for 2 weeks, initial encounter EXAM: CHEST  2 VIEW COMPARISON:  06/23/2016 FINDINGS: Cardiac shadow is at the upper limits of normal in size. Multiple calcified granulomas are noted consistent with prior granulomatous disease. No focal confluent infiltrate is seen. Mild bibasilar scarring is again noted. Degenerative change of the thoracic spine is seen. IMPRESSION: No acute abnormality noted. Electronically Signed   By: Inez Catalina M.D.   On: 07/18/2016 16:59   Ct Angio Chest Pe W And/or Wo Contrast  Result Date: 07/18/2016 CLINICAL DATA:  Chest discomfort for several days, worse last night and today. Burning and tightness. EXAM: CT ANGIOGRAPHY CHEST WITH CONTRAST TECHNIQUE: Multidetector CT imaging of the chest was performed using the standard protocol during bolus administration of intravenous contrast. Multiplanar CT image reconstructions and MIPs were obtained to evaluate the vascular anatomy. CONTRAST:  100 cc Isovue 370. COMPARISON:  Plain films of the chest today and 10/28/2013. FINDINGS: Evaluation for pulmonary embolus is limited by bolus  timing. The study was not repeated as the patient has renal insufficiency and mildly decreased GFR. No central or proximal segmental pulmonary embolus is identified. There is cardiomegaly. Extensive calcific coronary artery disease is seen. Multiple calcified mediastinal and hilar lymph nodes are noted. No axillary lymphadenopathy. No consolidative process is identified. There is some reticulation of lung parenchyma best seen in the right middle lobe and left lung base. No nodule is identified. Visualized upper abdomen demonstrates no focal abnormality. No lytic or sclerotic bony lesions identified. Review of the MIP images confirms the above findings. IMPRESSION: Although the study is somewhat limited by bolus timing. No pulmonary embolus is seen. Calcified mediastinal and hilar lymph nodes could be due to old granulomatous disease or sarcoid. There is mild reticulation of pulmonary parenchyma best seen in the right middle lobe which may be due to early pulmonary changes of sarcoidosis. Cardiomegaly. Calcific aortic and coronary atherosclerosis. Electronically Signed   By: Inge Rise M.D.   On: 07/18/2016 20:02    Procedures Procedures (including critical care time) CRITICAL CARE Performed by: YPPJK,DTOIZ Total critical care time: 40 minutes Critical care time was exclusive of separately billable procedures and treating other patients. Critical care was necessary to treat or prevent imminent or life-threatening deterioration. Critical care was time spent personally by me on the following activities: development of treatment plan with patient and/or surrogate as well as nursing, discussions with consultants, evaluation of patient's response to treatment, examination of patient, obtaining history from patient or surrogate, ordering and performing treatments and interventions, ordering and review of laboratory studies, ordering and review of radiographic studies, pulse oximetry and re-evaluation of  patient's condition.  Medications Ordered in ED Medications  nitroGLYCERIN 50 mg in dextrose 5 % 250 mL (0.2 mg/mL) infusion (15 mcg/min Intravenous Rate/Dose Change 07/18/16 1839)  heparin ADULT infusion 100 units/mL (25000 units/222mL sodium chloride 0.45%) (1,350 Units/hr Intravenous New Bag/Given 07/18/16 1743)  aspirin chewable tablet 324 mg (324 mg Oral Given 07/18/16 1719)  heparin bolus via infusion 4,000 Units (4,000 Units Intravenous Bolus from Bag 07/18/16 1744)  iopamidol (ISOVUE-370) 76 % injection (100 mLs  Contrast Given 07/18/16 1917)     Initial Impression / Assessment and Plan / ED Course  I have reviewed the triage vital signs and the nursing notes.  Pertinent labs & imaging results that were available during my care of the patient were reviewed by me and considered in my medical decision making (see chart for details).  Clinical Course    Chest discomfort which is worrisome for unstable angina. He is given aspirin and started on heparin and nitroglycerin. Review old records shows cardiac stents been placed into 2012. In 2016, he had progression of his coronary artery disease and also a 50% intimal narrowing in a stent in his right coronary artery. Initial troponin is normal.  Once placed on above noted drips, he states he is feeling much better. Because of asymmetric leg swelling, he was sent for CT angiogram to rule out pulmonary embolism and this shows no evidence of pulmonary emboli. Case is discussed with Dr. Wynonia Lawman of cardiology service who agrees to admit the patient.  Final Clinical Impressions(s) / ED Diagnoses   Final diagnoses:  Unstable angina [facility])  Renal insufficiency    New Prescriptions New Prescriptions   No medications on file     Delora Fuel, MD 17/00/17 [id number]

## 2016-07-18 NOTE — H&P (Addendum)
History and Physical   Admit date: 07/18/2016 Name:  Joshua [name] Medical record number: [id number] DOB/Age:  12-30-1955  61 y.o. male  Referring Physician:   [facility]  Primary Cardiologist:  Dr. Claiborne Billings  Primary Physician:   Dr. Alyson Ingles  Chief complaint/reason for admission: Chest pain  HPI:  This 60 year old black male presents to the emergency room with a two-week history of some substernal chest discomfort.  The discomfort usually occurs at night he will awaken him from sleep and is usually better if he sits up or walks around some.  He had recurrent episodes last evening and this afternoon had an episode of chest discomfort that was more severe than most radiates somewhat through to his back and up into his neck.  He took a nitroglycerin and the discomfort improved.  He was advised by the PA to come to the emergency room.  Initial enzymes are negative and EKG is unremarkable.  He was laid off recently and has been inactive and has gained weight.  He denies PND or orthopnea.  He does have some progressive shortness of breath and also has been having peripheral edema.  He has a history of coronary artery disease with stents to the mid and distal right coronary artery and to the LAD in 2012.  He underwent repeat catheterization last October with findings of a 60% circumflex stenosis, 50% in-stent stenosis of the right coronary artery and mild disease elsewhere.  He has known diabetic gastroparesis and has a recurrent ulcer on his foot that he is seen in the wound center for.  He also has reflux.  He has an appointment to see a gastroenterologist in the morning because of possible blood in the stools.    Past Medical History:  Diagnosis Date  . Chronic combined systolic and diastolic CHF (congestive heart failure) ([place])    a. 07/2015 Echo: EF 45-50%, mod LVH, mid apical/antsept AK, Gr 1 DD.  [name] Coronary artery disease    a. 2012 s/p MI/PCI: s/p DES to mid/dist RCA and  overlapping DES to LAD;  b. 07/2015 Cath: LAD 10% ISR, D1 40(jailed), LCX 109m, OM2 30, OM3 30, RCA 11m ISR, 10d ISR.  . Diabetic gastroparesis ([place])   . Essential hypertension   . GERD (gastroesophageal reflux disease)   . Gout   . Heart murmur   . Hyperlipemia   . Morbid obesity ([place])   . Myocardial infarction ([place]) 2012  . OSA on CPAP   . Polyneuropathy in diabetes(357.2)   . Pulmonary sarcoidosis ([place])    "problems w/it years ago; no problems anymore" (07/03/2014)  . Type II diabetes mellitus ([place])      Past Surgical History:  Procedure Laterality Date  . BREAST LUMPECTOMY Right ~ 2000   "benign"  . CARDIAC CATHETERIZATION N/A 07/30/2015   Procedure: Left Heart Cath and Coronary Angiography;  Surgeon: Burnell Blanks, MD;  Location: [facility];  Service: Cardiovascular;  Laterality: N/A;  . CATARACT EXTRACTION W/ INTRAOCULAR LENS  IMPLANT, BILATERAL Bilateral 2014-2015  . CORONARY ANGIOPLASTY WITH STENT PLACEMENT  10/31/2011   30 % MID ATRIOVENTRICULAR GROOVE CX STENOSIS. 90 % MID RCA.PTA TO LAD/STENTING OF TANDEM 60-70%. LAD STENOSIS 99% REDUCED TO 0%.3.0 X 32MM PROMUS DES POSTDILATED TO 3.25MM.  [name] CORONARY ANGIOPLASTY WITH STENT PLACEMENT  07/03/2014   "2"  . I&D EXTREMITY Right 10/30/2013   Procedure: IRRIGATION AND DEBRIDEMENT RIGHT SMALL FINGER POSSIBLE SECONDARY WOUND CLOSURE;  Surgeon: [name], MD;  Location: [facility];  Service: Orthopedics;  Laterality: Right;  [name] is available after 4pm  . I&D EXTREMITY Right 11/01/2013   Procedure:  IRRIGATION AND DEBRIDEMENT RIGHT  PROXIMAL PHALANGEAL LEVEL AMPUTATION;  Surgeon: [name], MD;  Location: [facility];  Service: Orthopedics;  Laterality: Right;  . INCISION AND DRAINAGE Right 10/28/2013   Procedure: INCISION AND DRAINAGE Right small finger;  Surgeon: [name], MD;  Location: [facility];  Service: Orthopedics;  Laterality: Right;  . LEFT HEART CATH Left 11/03/2011   Procedure: LEFT HEART  CATH;  Surgeon: [name], MD;  Location: [facility];  Service: Cardiovascular;  Laterality: Left;  . LEFT HEART CATHETERIZATION WITH CORONARY ANGIOGRAM N/A 07/03/2014   Procedure: LEFT HEART CATHETERIZATION WITH CORONARY ANGIOGRAM;  Surgeon: [name], MD;  Location: [facility] CATH LAB;  Service: Cardiovascular;  Laterality: N/A;  . PERCUTANEOUS CORONARY STENT INTERVENTION (PCI-S) Left 11/03/2011   Procedure: PERCUTANEOUS CORONARY STENT INTERVENTION (PCI-S);  Surgeon: [name], MD;  Location: [facility] CATH LAB;  Service: Cardiovascular;  Laterality: Left;  . TOE AMPUTATION Right ~ 2010   "2nd toe"  . TOE AMPUTATION Right 2012   "3rd toe"   Allergies: has no allergies on file.   Medications: Prior to Admission medications   Medication Sig Start Date End Date Taking? Authorizing Provider  allopurinol (ZYLOPRIM) 300 MG tablet Take 300 mg by mouth daily.    Yes Historical Provider, MD  amLODipine-olmesartan (AZOR) 10-40 MG tablet Take 1 tablet by mouth daily. 03/10/16  Yes [name], MD  aspirin EC 81 MG tablet Take 81 mg by mouth daily.   Yes Historical Provider, MD  doxazosin (CARDURA) 8 MG tablet Take 8 mg by mouth daily.  03/21/15  Yes Historical Provider, MD  furosemide (LASIX) 40 MG tablet TAKE 1 TABLET (40 MG TOTAL) BY MOUTH DAILY. 05/18/16  Yes [name], MD  hydrALAZINE (APRESOLINE) 25 MG tablet Take 1.5 tablets (37.5 mg total) by mouth 2 (two) times daily. Patient taking differently: Take 25 mg by mouth 2 (two) times daily.  02/16/16  Yes [name], MD  isosorbide mononitrate (IMDUR) 60 MG 24 hr tablet Take 1 tablet (60 mg total) by mouth daily. 08/08/15  Yes Rogelia Mire, NP  nitroGLYCERIN (NITROSTAT) 0.4 MG SL tablet Place 1 tablet (0.4 mg total) under the tongue every 5 (five) minutes as needed for chest pain. 10/19/13  Yes [name], MD  NOVOLIN N RELION 100 UNIT/ML injection Inject 50-60 Units into the skin See admin instructions. 50 units in the morning  and 60 units in the evening 06/25/16  Yes Historical Provider, MD  NOVOLIN R RELION 100 UNIT/ML injection Inject 0-10 Units into the skin 3 (three) times daily before meals. Per sliding scale 06/25/16  Yes Historical Provider, MD  pantoprazole (PROTONIX) 40 MG tablet Take 1 tablet (40 mg total) by mouth daily. 09/02/15  Yes [name], MD  rosuvastatin (CRESTOR) 40 MG tablet Take 1 tablet (40 mg total) by mouth daily. 11/02/13  Yes Janece Canterbury, MD  ticagrelor (BRILINTA) 90 MG TABS tablet Take 90 mg by mouth 2 (two) times daily.   Yes Historical Provider, MD  insulin NPH-regular (HUMULIN 70/30) (70-30) 100 UNIT/ML injection Inject 40 Units into the skin 2 (two) times daily. Patient not taking: Reported on 07/18/2016 11/02/13   Janece Canterbury, MD  metoprolol succinate (TOPROL-XL) 100 MG 24 hr tablet Take 1 tablet (100 mg total) by mouth daily. Take  with or immediately following a meal. Patient not taking: Reported on 07/18/2016 10/09/13   [name], MD  ticagrelor (BRILINTA) 60 MG TABS tablet Take 1 tablet (60 mg total) by mouth 2 (two) times daily. Patient not taking: Reported on 07/18/2016 02/16/16   [name], MD   Family History:  Family Status  Relation Status  . Mother Deceased  . Father Other   unknown  . Maternal Aunt   . Maternal Grandmother     Social History:   reports that he has never smoked. He has never used smokeless tobacco. He reports that he drinks alcohol. He reports that he does not use drugs.   Currently laid off   Review of Systems:  Been morbidly obese and has gained weight.  No skin problems, he has had cataract extraction but no other eye problems, does have moderate dyspnea with exertion, he has some constipation and also has been having episodic indigestion recently was diagnosed with some blood in his stools.  No urinary symptoms.  Mild arthritis of his back and knees.  Does have peripheral edema and has an ulcer on his right foot that is being seen at  the wound center for.  He has known diabetic gastroparesis and difficulty with early satiety.  In addition has some numbness of his lower extremities. Other than as noted above, the remainder of the review of systems is normal  Physical Exam: BP 124/72   Pulse 91   Temp 98.4 F (36.9 C) (Oral)   Resp 13   Ht $R'5\' 9"'UN$  (1.753 m)   Wt (!) 143.8 kg (317 lb)   SpO2 97%   BMI 46.81 kg/m  General appearance: Extremely large obese black male currently in no acute distress Eyes: conjunctivae/corneas clear. PERRL, EOM's intact. Fundi not examined  Neck: no adenopathy, no carotid bruit, supple, symmetrical, trachea midline and JVD difficult to assess due to a thick neck  Lungs: Clear bilaterally Heart: regular rate and rhythm, S1, S2 normal, no murmur, click, rub or gallop Abdomen: soft, non-tender; bowel sounds normal; no masses,  no organomegaly and Quite large Rectal: deferred Extremities: Right foot with ulcer which is currently in a walking boot which was not officially examined, 2+ edema, no deformity Pulses: 2+ and symmetric Skin: Skin color, texture, turgor normal. No rashes or lesions Neurologic: Grossly normal   Labs: CBC  Recent Labs  07/18/16 1612  WBC 10.4  RBC 4.58  HGB 13.0  HCT 40.8  PLT 166  MCV 89.1  MCH 28.4  MCHC 31.9  RDW 14.7   CMP   Recent Labs  07/18/16 1612  NA 134*  K 3.7  CL 104  CO2 22  GLUCOSE 200*  BUN 14  CREATININE 1.34*  CALCIUM 8.4*  GFRNONAA 57*  GFRAA >60    BNP (last 3 results)  Recent Labs  07/29/15 0354  BNP 148.7*   EKG: Sinus tachycardia, old anteroseptal infarction, no acute abnormality  Radiology: Upper limits normal heart size, clear lungs, degenerative changes of spine   IMPRESSIONS: 1.  Chest pain suggestive of unstable angina pectoris-some features are atypical 2.  Coronary artery disease with previous stent to the LAD and right coronary artery previously patent at catheterization last October, moderate  disease involving the marginal system and diagonal 3.  Diabetes mellitus with gastroparesis peripheral neuropathy and ulceration as well as mild diabetic nephropathy 4.  Hypertensive heart disease 5.  Morbid obesity  PLAN: Admit to telemetry.  Check serial cardiac enzymes,  nothing by mouth after midnight for possible catheterization tomorrow.  History has some atypical features but is suggestive of ischemia.  Signed: Kerry Hough MD [facility]  07/18/2016, 9:36 PM

## 2016-07-19 ENCOUNTER — Encounter (HOSPITAL_COMMUNITY): Payer: Self-pay | Admitting: Interventional Cardiology

## 2016-07-19 ENCOUNTER — Encounter (HOSPITAL_COMMUNITY)
Admission: EM | Disposition: A | Discharge status: Rehabilitation Facility | Payer: Self-pay | Source: Home / Self Care | Attending: Cardiovascular Disease

## 2016-07-19 DIAGNOSIS — L0291 Cutaneous abscess, unspecified: Secondary | ICD-10-CM | POA: Diagnosis not present

## 2016-07-19 DIAGNOSIS — I25110 Atherosclerotic heart disease of native coronary artery with unstable angina pectoris: Secondary | ICD-10-CM | POA: Diagnosis present

## 2016-07-19 DIAGNOSIS — E1022 Type 1 diabetes mellitus with diabetic chronic kidney disease: Secondary | ICD-10-CM | POA: Diagnosis present

## 2016-07-19 DIAGNOSIS — I5043 Acute on chronic combined systolic (congestive) and diastolic (congestive) heart failure: Secondary | ICD-10-CM | POA: Diagnosis not present

## 2016-07-19 DIAGNOSIS — I200 Unstable angina: Secondary | ICD-10-CM

## 2016-07-19 DIAGNOSIS — L0390 Cellulitis, unspecified: Secondary | ICD-10-CM | POA: Diagnosis not present

## 2016-07-19 DIAGNOSIS — N289 Disorder of kidney and ureter, unspecified: Secondary | ICD-10-CM | POA: Diagnosis not present

## 2016-07-19 DIAGNOSIS — K219 Gastro-esophageal reflux disease without esophagitis: Secondary | ICD-10-CM | POA: Diagnosis present

## 2016-07-19 DIAGNOSIS — E1169 Type 2 diabetes mellitus with other specified complication: Secondary | ICD-10-CM | POA: Diagnosis not present

## 2016-07-19 DIAGNOSIS — I878 Other specified disorders of veins: Secondary | ICD-10-CM | POA: Diagnosis present

## 2016-07-19 DIAGNOSIS — E1069 Type 1 diabetes mellitus with other specified complication: Secondary | ICD-10-CM | POA: Diagnosis present

## 2016-07-19 DIAGNOSIS — E1142 Type 2 diabetes mellitus with diabetic polyneuropathy: Secondary | ICD-10-CM | POA: Diagnosis not present

## 2016-07-19 DIAGNOSIS — L97519 Non-pressure chronic ulcer of other part of right foot with unspecified severity: Secondary | ICD-10-CM | POA: Diagnosis present

## 2016-07-19 DIAGNOSIS — I5042 Chronic combined systolic (congestive) and diastolic (congestive) heart failure: Secondary | ICD-10-CM | POA: Diagnosis present

## 2016-07-19 DIAGNOSIS — I252 Old myocardial infarction: Secondary | ICD-10-CM | POA: Diagnosis not present

## 2016-07-19 DIAGNOSIS — I130 Hypertensive heart and chronic kidney disease with heart failure and stage 1 through stage 4 chronic kidney disease, or unspecified chronic kidney disease: Secondary | ICD-10-CM | POA: Diagnosis present

## 2016-07-19 DIAGNOSIS — R079 Chest pain, unspecified: Secondary | ICD-10-CM | POA: Diagnosis present

## 2016-07-19 DIAGNOSIS — L02415 Cutaneous abscess of right lower limb: Secondary | ICD-10-CM | POA: Diagnosis present

## 2016-07-19 DIAGNOSIS — K3184 Gastroparesis: Secondary | ICD-10-CM | POA: Diagnosis present

## 2016-07-19 DIAGNOSIS — I2510 Atherosclerotic heart disease of native coronary artery without angina pectoris: Secondary | ICD-10-CM | POA: Diagnosis not present

## 2016-07-19 DIAGNOSIS — Z89431 Acquired absence of right foot: Secondary | ICD-10-CM | POA: Diagnosis not present

## 2016-07-19 DIAGNOSIS — N189 Chronic kidney disease, unspecified: Secondary | ICD-10-CM | POA: Diagnosis not present

## 2016-07-19 DIAGNOSIS — L03115 Cellulitis of right lower limb: Secondary | ICD-10-CM | POA: Diagnosis present

## 2016-07-19 DIAGNOSIS — M86171 Other acute osteomyelitis, right ankle and foot: Secondary | ICD-10-CM | POA: Diagnosis not present

## 2016-07-19 DIAGNOSIS — M109 Gout, unspecified: Secondary | ICD-10-CM | POA: Diagnosis present

## 2016-07-19 DIAGNOSIS — N179 Acute kidney failure, unspecified: Secondary | ICD-10-CM | POA: Diagnosis not present

## 2016-07-19 DIAGNOSIS — E11621 Type 2 diabetes mellitus with foot ulcer: Secondary | ICD-10-CM | POA: Diagnosis not present

## 2016-07-19 DIAGNOSIS — Z6841 Body Mass Index (BMI) 40.0 and over, adult: Secondary | ICD-10-CM | POA: Diagnosis not present

## 2016-07-19 DIAGNOSIS — Z01810 Encounter for preprocedural cardiovascular examination: Secondary | ICD-10-CM | POA: Diagnosis not present

## 2016-07-19 DIAGNOSIS — I110 Hypertensive heart disease with heart failure: Secondary | ICD-10-CM | POA: Diagnosis present

## 2016-07-19 DIAGNOSIS — G4733 Obstructive sleep apnea (adult) (pediatric): Secondary | ICD-10-CM | POA: Diagnosis present

## 2016-07-19 DIAGNOSIS — M86671 Other chronic osteomyelitis, right ankle and foot: Secondary | ICD-10-CM | POA: Diagnosis present

## 2016-07-19 DIAGNOSIS — D62 Acute posthemorrhagic anemia: Secondary | ICD-10-CM | POA: Diagnosis not present

## 2016-07-19 DIAGNOSIS — E1143 Type 2 diabetes mellitus with diabetic autonomic (poly)neuropathy: Secondary | ICD-10-CM | POA: Diagnosis present

## 2016-07-19 DIAGNOSIS — R7989 Other specified abnormal findings of blood chemistry: Secondary | ICD-10-CM

## 2016-07-19 DIAGNOSIS — Z7982 Long term (current) use of aspirin: Secondary | ICD-10-CM | POA: Diagnosis not present

## 2016-07-19 DIAGNOSIS — E785 Hyperlipidemia, unspecified: Secondary | ICD-10-CM | POA: Diagnosis present

## 2016-07-19 DIAGNOSIS — N183 Chronic kidney disease, stage 3 (moderate): Secondary | ICD-10-CM | POA: Diagnosis present

## 2016-07-19 DIAGNOSIS — L02419 Cutaneous abscess of limb, unspecified: Secondary | ICD-10-CM | POA: Diagnosis not present

## 2016-07-19 HISTORY — PX: CARDIAC CATHETERIZATION: SHX172

## 2016-07-19 LAB — PROTIME-INR
INR: 1
Prothrombin Time: 13.2 seconds (ref 11.4–15.2)

## 2016-07-19 LAB — CBC
HCT: 36.4 % — ABNORMAL LOW (ref 39.0–52.0)
Hemoglobin: 11.4 g/dL — ABNORMAL LOW (ref 13.0–17.0)
MCH: 27.7 pg (ref 26.0–34.0)
MCHC: 31.3 g/dL (ref 30.0–36.0)
MCV: 88.6 fL (ref 78.0–100.0)
Platelets: 148 10*3/uL — ABNORMAL LOW (ref 150–400)
RBC: 4.11 MIL/uL — ABNORMAL LOW (ref 4.22–5.81)
RDW: 14.8 % (ref 11.5–15.5)
WBC: 8.2 10*3/uL (ref 4.0–10.5)

## 2016-07-19 LAB — TROPONIN I
Troponin I: 0.16 ng/mL (ref <0.03)
Troponin I: 0.18 ng/mL (ref <0.03)
Troponin I: 0.24 ng/mL (ref <0.03)

## 2016-07-19 LAB — BASIC METABOLIC PANEL
Anion gap: 6 (ref 5–15)
BUN: 15 mg/dL (ref 6–20)
CO2: 25 mmol/L (ref 22–32)
Calcium: 8.7 mg/dL — ABNORMAL LOW (ref 8.9–10.3)
Chloride: 109 mmol/L (ref 101–111)
Creatinine, Ser: 1.45 mg/dL — ABNORMAL HIGH (ref 0.61–1.24)
GFR calc Af Amer: >60 mL/min (ref >60)
GFR calc non Af Amer: 52 mL/min — ABNORMAL LOW (ref >60)
Glucose, Bld: 179 mg/dL — ABNORMAL HIGH (ref 65–99)
Potassium: 3.7 mmol/L (ref 3.5–5.1)
Sodium: 140 mmol/L (ref 135–145)

## 2016-07-19 LAB — GLUCOSE, CAPILLARY
Glucose-Capillary: 153 mg/dL — ABNORMAL HIGH (ref 65–99)
Glucose-Capillary: 73 mg/dL (ref 65–99)
Glucose-Capillary: 84 mg/dL (ref 65–99)
Glucose-Capillary: 86 mg/dL (ref 65–99)
Glucose-Capillary: 108 mg/dL — ABNORMAL HIGH (ref 65–99)

## 2016-07-19 LAB — HEPARIN LEVEL (UNFRACTIONATED)
Heparin Unfractionated: 0.31 IU/mL (ref 0.30–0.70)
Heparin Unfractionated: 0.4 IU/mL (ref 0.30–0.70)

## 2016-07-19 LAB — TSH: TSH: 3.175 u[IU]/mL (ref 0.350–4.500)

## 2016-07-19 SURGERY — LEFT HEART CATH AND CORONARY ANGIOGRAPHY

## 2016-07-19 MED ORDER — MIDAZOLAM HCL 2 MG/2ML IJ SOLN
INTRAMUSCULAR | Status: DC | PRN
  Administered 2016-07-19: 1 mg via INTRAVENOUS

## 2016-07-19 MED ORDER — FENTANYL CITRATE (PF) 100 MCG/2ML IJ SOLN
INTRAMUSCULAR | Status: DC | PRN
  Administered 2016-07-19: 50 ug via INTRAVENOUS

## 2016-07-19 MED ORDER — ASPIRIN 81 MG PO CHEW
81.0000 mg | CHEWABLE_TABLET | Freq: Every day | ORAL | Status: DC
  Administered 2016-07-20 – 2016-07-21 (×2): 81 mg via ORAL
  Filled 2016-07-19 (×2): qty 1

## 2016-07-19 MED ORDER — IOPAMIDOL (ISOVUE-370) INJECTION 76%
INTRAVENOUS | Status: AC
  Filled 2016-07-19: qty 100

## 2016-07-19 MED ORDER — SODIUM CHLORIDE 0.9% FLUSH: 3.0000 mL | Freq: Two times a day (BID) | INTRAVENOUS | Status: DC

## 2016-07-19 MED ORDER — HEPARIN (PORCINE) IN NACL 2-0.9 UNIT/ML-% IJ SOLN
INTRAMUSCULAR | Status: AC
  Filled 2016-07-19: qty 1000

## 2016-07-19 MED ORDER — LIDOCAINE HCL (PF) 1 % IJ SOLN
INTRAMUSCULAR | Status: DC | PRN
  Administered 2016-07-19: 3 mL

## 2016-07-19 MED ORDER — SODIUM CHLORIDE 0.9 % WEIGHT BASED INFUSION
1.0000 mL/kg/h | INTRAVENOUS | Status: AC
  Administered 2016-07-19: 1 mL/kg/h via INTRAVENOUS

## 2016-07-19 MED ORDER — SODIUM CHLORIDE 0.9 % IV SOLN: 250.0000 mL | INTRAVENOUS | Status: DC | PRN

## 2016-07-19 MED ORDER — ACETAMINOPHEN 325 MG PO TABS
650.0000 mg | ORAL_TABLET | ORAL | Status: DC | PRN
  Administered 2016-07-19 – 2016-07-29 (×8): 650 mg via ORAL
  Filled 2016-07-19 (×8): qty 2

## 2016-07-19 MED ORDER — IOPAMIDOL (ISOVUE-370) INJECTION 76%
INTRAVENOUS | Status: DC | PRN
  Administered 2016-07-19: 80 mL via INTRAVENOUS

## 2016-07-19 MED ORDER — TICAGRELOR 60 MG PO TABS
60.0000 mg | ORAL_TABLET | Freq: Two times a day (BID) | ORAL | Status: DC
  Administered 2016-07-19: 60 mg via ORAL
  Filled 2016-07-19: qty 1

## 2016-07-19 MED ORDER — HEPARIN SODIUM (PORCINE) 1000 UNIT/ML IJ SOLN
INTRAMUSCULAR | Status: DC | PRN
  Administered 2016-07-19: 7000 [IU] via INTRAVENOUS

## 2016-07-19 MED ORDER — SODIUM CHLORIDE 0.9% FLUSH: 3.0000 mL | INTRAVENOUS | Status: DC | PRN

## 2016-07-19 MED ORDER — HEPARIN (PORCINE) IN NACL 2-0.9 UNIT/ML-% IJ SOLN
INTRAMUSCULAR | Status: DC | PRN
  Administered 2016-07-19: 10 mL via INTRA_ARTERIAL

## 2016-07-19 MED ORDER — HEPARIN (PORCINE) IN NACL 2-0.9 UNIT/ML-% IJ SOLN
INTRAMUSCULAR | Status: DC | PRN
  Administered 2016-07-19: 1000 mL

## 2016-07-19 MED ORDER — HEPARIN SODIUM (PORCINE) 1000 UNIT/ML IJ SOLN
INTRAMUSCULAR | Status: AC
  Filled 2016-07-19: qty 1

## 2016-07-19 MED ORDER — MIDAZOLAM HCL 2 MG/2ML IJ SOLN
INTRAMUSCULAR | Status: AC
  Filled 2016-07-19: qty 2

## 2016-07-19 MED ORDER — FENTANYL CITRATE (PF) 100 MCG/2ML IJ SOLN
INTRAMUSCULAR | Status: AC
  Filled 2016-07-19: qty 2

## 2016-07-19 MED ORDER — VERAPAMIL HCL 2.5 MG/ML IV SOLN
INTRAVENOUS | Status: AC
  Filled 2016-07-19: qty 2

## 2016-07-19 MED ORDER — LIDOCAINE HCL (PF) 1 % IJ SOLN
INTRAMUSCULAR | Status: AC
  Filled 2016-07-19: qty 30

## 2016-07-19 MED ORDER — HEPARIN (PORCINE) IN NACL 100-0.45 UNIT/ML-% IJ SOLN
2200.0000 [IU]/h | INTRAMUSCULAR | Status: DC
  Administered 2016-07-20: 1450 [IU]/h via INTRAVENOUS
  Administered 2016-07-20 – 2016-07-21 (×2): 1650 [IU]/h via INTRAVENOUS
  Administered 2016-07-21 – 2016-07-23 (×5): 1850 [IU]/h via INTRAVENOUS
  Administered 2016-07-24: 2200 [IU]/h via INTRAVENOUS
  Filled 2016-07-19 (×7): qty 250

## 2016-07-19 MED ORDER — ASPIRIN 81 MG PO CHEW: 81.0000 mg | CHEWABLE_TABLET | ORAL | Status: DC

## 2016-07-19 MED ORDER — SODIUM CHLORIDE 0.9% FLUSH
3.0000 mL | Freq: Two times a day (BID) | INTRAVENOUS | Status: DC
  Administered 2016-07-24 – 2016-07-27 (×4): 3 mL via INTRAVENOUS

## 2016-07-19 MED ORDER — ONDANSETRON HCL 4 MG/2ML IJ SOLN
4.0000 mg | Freq: Four times a day (QID) | INTRAMUSCULAR | Status: DC | PRN
  Administered 2016-07-19: 4 mg via INTRAVENOUS
  Filled 2016-07-19: qty 2

## 2016-07-19 MED ORDER — IOPAMIDOL (ISOVUE-370) INJECTION 76%
INTRAVENOUS | Status: AC
  Filled 2016-07-19: qty 50

## 2016-07-19 MED ORDER — SODIUM CHLORIDE 0.9 % IV SOLN
INTRAVENOUS | Status: DC
  Administered 2016-07-19: 12:00:00 via INTRAVENOUS

## 2016-07-19 SURGICAL SUPPLY — 10 items
CATH INFINITI 5 FR JL3.5 (CATHETERS) ×1 IMPLANT
CATH INFINITI JR4 5F (CATHETERS) ×1 IMPLANT
DEVICE RAD COMP TR BAND LRG (VASCULAR PRODUCTS) ×1 IMPLANT
GLIDESHEATH SLEND A-KIT 6F 22G (SHEATH) ×1 IMPLANT
HOVERMATT SINGLE USE (MISCELLANEOUS) ×1 IMPLANT
KIT HEART LEFT (KITS) ×2 IMPLANT
PACK CARDIAC CATHETERIZATION (CUSTOM PROCEDURE TRAY) ×2 IMPLANT
TRANSDUCER W/STOPCOCK (MISCELLANEOUS) ×2 IMPLANT
TUBING CIL FLEX 10 FLL-RA (TUBING) ×2 IMPLANT
WIRE SAFE-T 1.5MM-J .035X260CM (WIRE) ×1 IMPLANT

## 2016-07-19 NOTE — Progress Notes (Signed)
ANTICOAGULATION CONSULT NOTE - Follow Up Consult  Pharmacy Consult for Heparin  Indication: chest pain/ACS  Not on File  Patient Measurements: Height: 5\' 9"  (175.3 cm) Weight: (!) 317 lb 7.4 oz (144 kg) IBW/kg (Calculated) : 70.7  Vital Signs: Temp: 98.9 F (37.2 C) (08/07 0544) Temp Source: Oral (08/07 0544) BP: 130/73 (08/07 0544) Pulse Rate: 86 (08/07 0544)  Labs:  Recent Labs  07/18/16 1612 07/18/16 2357 07/19/16 0508 07/19/16 0750  HGB 13.0  --  11.4*  --   HCT 40.8  --  36.4*  --   PLT 166  --  148*  --   HEPARINUNFRC  --  0.31  --  0.40  CREATININE 1.34*  --  1.45*  --   TROPONINI  --  0.24* 0.18*  --     Estimated Creatinine Clearance: 79.5 mL/min (by C-G formula based on SCr of 1.45 mg/dL).   Assessment: 60 yo male with CP on heparin and noted at goal (HL= 0.4). Plans for possible cath today.    Goal of Therapy:  Heparin level 0.3-0.7 units/ml Monitor platelets by anticoagulation protocol: Yes   Plan:  -No heparin changes needed -Will follow plans for cath  Hildred Laser, Pharm D 07/19/2016 9:38 AM

## 2016-07-19 NOTE — Progress Notes (Signed)
Subjective:  No recurrent CP on IV hep/NTG  Objective:  Temp:  [98.3 F (36.8 C)-98.9 F (37.2 C)] 98.9 F (37.2 C) (08/07 0544) Pulse Rate:  [86-112] 86 (08/07 0544) Resp:  [13-25] 18 (08/07 0544) BP: (115-153)/(55-91) 130/73 (08/07 0544) SpO2:  [93 %-98 %] 97 % (08/07 0544) Weight:  [317 lb (143.8 kg)-317 lb 7.4 oz (144 kg)] 317 lb 7.4 oz (144 kg) (08/06 2305) Weight change:   Intake/Output from previous day: 08/06 0701 - 08/07 0700 In: -  Out: 675 [Urine:675]  Intake/Output from this shift: No intake/output data recorded.  Physical Exam: General appearance: alert and no distress Neck: no adenopathy, no carotid bruit, no JVD, supple, symmetrical, trachea midline and thyroid not enlarged, symmetric, no tenderness/mass/nodules Lungs: clear to auscultation bilaterally Heart: regular rate and rhythm, S1, S2 normal, no murmur, click, rub or gallop Extremities: extremities normal, atraumatic, no cyanosis or edema  Lab Results: Results for orders placed or performed during the hospital encounter of 07/18/16 (from the past 48 hour(s))  Basic metabolic panel     Status: Abnormal   Collection Time: 07/18/16  4:12 PM  Result Value Ref Range   Sodium 134 (L) 135 - 145 mmol/L   Potassium 3.7 3.5 - 5.1 mmol/L   Chloride 104 101 - 111 mmol/L   CO2 22 22 - 32 mmol/L   Glucose, Bld 200 (H) 65 - 99 mg/dL   BUN 14 6 - 20 mg/dL   Creatinine, Ser 1.34 (H) 0.61 - 1.24 mg/dL   Calcium 8.4 (L) 8.9 - 10.3 mg/dL   GFR calc non Af Amer 57 (L) >60 mL/min   GFR calc Af Amer >60 >60 mL/min    Comment: (NOTE) The eGFR has been calculated using the CKD EPI equation. This calculation has not been validated in all clinical situations. eGFR's persistently <60 mL/min signify possible Chronic Kidney Disease.    Anion gap 8 5 - 15  CBC     Status: None   Collection Time: 07/18/16  4:12 PM  Result Value Ref Range   WBC 10.4 4.0 - 10.5 K/uL   RBC 4.58 4.22 - 5.81 MIL/uL   Hemoglobin 13.0  13.0 - 17.0 g/dL   HCT 40.8 39.0 - 52.0 %   MCV 89.1 78.0 - 100.0 fL   MCH 28.4 26.0 - 34.0 pg   MCHC 31.9 30.0 - 36.0 g/dL   RDW 14.7 11.5 - 15.5 %   Platelets 166 150 - 400 K/uL  I-stat troponin, ED     Status: None   Collection Time: 07/18/16  4:20 PM  Result Value Ref Range   Troponin i, poc 0.08 0.00 - 0.08 ng/mL   Comment 3            Comment: Due to the release kinetics of cTnI, a negative result within the first hours of the onset of symptoms does not rule out myocardial infarction with certainty. If myocardial infarction is still suspected, repeat the test at appropriate intervals.   I-stat troponin, ED     Status: Abnormal   Collection Time: 07/18/16  9:57 PM  Result Value Ref Range   Troponin i, poc 0.22 (HH) 0.00 - 0.08 ng/mL   Comment NOTIFIED PHYSICIAN    Comment 3            Comment: Due to the release kinetics of cTnI, a negative result within the first hours of the onset of symptoms does not rule out myocardial infarction with certainty.  If myocardial infarction is still suspected, repeat the test at appropriate intervals.   Heparin level (unfractionated)     Status: None   Collection Time: 07/18/16 11:57 PM  Result Value Ref Range   Heparin Unfractionated 0.31 0.30 - 0.70 IU/mL    Comment:        IF HEPARIN RESULTS ARE BELOW EXPECTED VALUES, AND PATIENT DOSAGE HAS BEEN CONFIRMED, SUGGEST FOLLOW UP TESTING OF ANTITHROMBIN III LEVELS.   TSH     Status: None   Collection Time: 07/18/16 11:57 PM  Result Value Ref Range   TSH 3.175 0.350 - 4.500 uIU/mL  Troponin I-(serum)     Status: Abnormal   Collection Time: 07/18/16 11:57 PM  Result Value Ref Range   Troponin I 0.24 (HH) <0.03 ng/mL    Comment: CRITICAL RESULT CALLED TO, READ BACK BY AND VERIFIED WITH: FAYE N,RN 07/19/16 0047 WAYK   Glucose, capillary     Status: Abnormal   Collection Time: 07/18/16 11:57 PM  Result Value Ref Range   Glucose-Capillary 157 (H) 65 - 99 mg/dL   Comment 1 Notify  RN    Comment 2 Document in Chart   CBC     Status: Abnormal   Collection Time: 07/19/16  5:08 AM  Result Value Ref Range   WBC 8.2 4.0 - 10.5 K/uL   RBC 4.11 (L) 4.22 - 5.81 MIL/uL   Hemoglobin 11.4 (L) 13.0 - 17.0 g/dL   HCT 36.4 (L) 39.0 - 52.0 %   MCV 88.6 78.0 - 100.0 fL   MCH 27.7 26.0 - 34.0 pg   MCHC 31.3 30.0 - 36.0 g/dL   RDW 14.8 11.5 - 15.5 %   Platelets 148 (L) 150 - 400 K/uL  Troponin I-(serum)     Status: Abnormal   Collection Time: 07/19/16  5:08 AM  Result Value Ref Range   Troponin I 0.18 (HH) <0.03 ng/mL    Comment: CRITICAL VALUE NOTED.  VALUE IS CONSISTENT WITH PREVIOUSLY REPORTED AND CALLED VALUE.  Basic metabolic panel     Status: Abnormal   Collection Time: 07/19/16  5:08 AM  Result Value Ref Range   Sodium 140 135 - 145 mmol/L   Potassium 3.7 3.5 - 5.1 mmol/L   Chloride 109 101 - 111 mmol/L   CO2 25 22 - 32 mmol/L   Glucose, Bld 179 (H) 65 - 99 mg/dL   BUN 15 6 - 20 mg/dL   Creatinine, Ser 1.45 (H) 0.61 - 1.24 mg/dL   Calcium 8.7 (L) 8.9 - 10.3 mg/dL   GFR calc non Af Amer 52 (L) >60 mL/min   GFR calc Af Amer >60 >60 mL/min    Comment: (NOTE) The eGFR has been calculated using the CKD EPI equation. This calculation has not been validated in all clinical situations. eGFR's persistently <60 mL/min signify possible Chronic Kidney Disease.    Anion gap 6 5 - 15  Glucose, capillary     Status: Abnormal   Collection Time: 07/19/16  6:18 AM  Result Value Ref Range   Glucose-Capillary 153 (H) 65 - 99 mg/dL   Comment 1 Notify RN    Comment 2 Document in Chart   Heparin level (unfractionated)     Status: None   Collection Time: 07/19/16  7:50 AM  Result Value Ref Range   Heparin Unfractionated 0.40 0.30 - 0.70 IU/mL    Comment:        IF HEPARIN RESULTS ARE BELOW EXPECTED VALUES, AND PATIENT DOSAGE HAS BEEN  CONFIRMED, SUGGEST FOLLOW UP TESTING OF ANTITHROMBIN III LEVELS.     Imaging: Imaging results have been reviewed  Tele- NSR at  90  Assessment/Plan:   1. Active Problems: 2.   Unstable angina pectoris ([place]) 3. Pos Troponins  Time Spent Directly with Patient:  15 minutes  Length of Stay:  LOS: 0 days   Pt admitted yesterday with [place]. Known CAD s/p stenting 2012 and re look cath 10/16. 2 weeks of recurrent CP, worse on Sunday. SL NTG responsive. Enz mild ly elevated. On IV hep/NTG. No further CP. Exam benign. Labs remarkable for sl increase in SCr 1.46. For Right radial LHC today.  Quay Burow 07/19/2016, 10:19 AM

## 2016-07-19 NOTE — Progress Notes (Signed)
ANTICOAGULATION CONSULT NOTE - Follow Up Consult  Pharmacy Consult for Heparin  Indication: chest pain/ACS  Not on File  Patient Measurements: Height: 5\' 9"  (175.3 cm) Weight: (!) 317 lb 7.4 oz (144 kg) IBW/kg (Calculated) : 70.7  Vital Signs: Temp: 98.3 F (36.8 C) (08/06 2305) Temp Source: Oral (08/06 2305) BP: 126/77 (08/06 2305) Pulse Rate: 94 (08/06 2305)  Labs:  Recent Labs  07/18/16 1612 07/18/16 2357  HGB 13.0  --   HCT 40.8  --   PLT 166  --   HEPARINUNFRC  --  0.31  CREATININE 1.34*  --     Estimated Creatinine Clearance: 86 mL/min (by C-G formula based on SCr of 1.34 mg/dL).   Assessment: Heparin for CP, initial heparin level on low end of therapeutic range, no issues per RN, possible cath today  Goal of Therapy:  Heparin level 0.3-0.7 units/ml Monitor platelets by anticoagulation protocol: Yes   Plan:  -Inc heparin to 1450 units/hr to prevent sub-therapeutic level -0800 HL -Possible cath 8/7  Narda Bonds 07/19/2016,12:23 AM

## 2016-07-19 NOTE — Progress Notes (Signed)
ANTICOAGULATION CONSULT NOTE - Follow Up Consult  Pharmacy Consult for Heparin  Indication: 3V CAD  Not on File  Patient Measurements: Height: 5\' 9"  (175.3 cm) Weight: (!) 317 lb 7.4 oz (144 kg) IBW/kg (Calculated) : 70.7  Vital Signs: Temp: 98.9 F (37.2 C) (08/07 1520) Temp Source: Oral (08/07 1520) BP: 133/54 (08/07 1520) Pulse Rate: 98 (08/07 1520)  Labs:  Recent Labs  07/18/16 1612 07/18/16 2357 07/19/16 0508 07/19/16 0750 07/19/16 1105 07/19/16 1253  HGB 13.0  --  11.4*  --   --   --   HCT 40.8  --  36.4*  --   --   --   PLT 166  --  148*  --   --   --   LABPROT  --   --   --   --  13.2  --   INR  --   --   --   --  1.00  --   HEPARINUNFRC  --  0.31  --  0.40  --   --   CREATININE 1.34*  --  1.45*  --   --   --   TROPONINI  --  0.24* 0.18*  --   --  0.16*    Estimated Creatinine Clearance: 79.5 mL/min (by C-G formula based on SCr of 1.45 mg/dL).  Assessment: 60 yo male s/p cath which showed 3V CAD. Plans for staged intervention vs CABG. To restart heparin 8 hours post sheath removal - RN says that TR band should be off by ~1800. Pt with therapeutic heparin pre-cath on 1450 units/hr.  Goal of Therapy:  Heparin level 0.3-0.7 units/ml Monitor platelets by anticoagulation protocol: Yes   Plan:  - On 8/8 at 0200 (~8 hrs post TR band off), start heparin at 1450 units/hr. No bolus. - Will f/u 6 hr heparin level - Daily heparin level and CBC - F/u plans/timing of interventions  Sherlon Handing, PharmD, BCPS Clinical pharmacist, pager [phone]  07/19/2016 4:00 PM

## 2016-07-19 NOTE — Progress Notes (Addendum)
Pt complained of nausea and a headache, and SOB. Pt was visibly shaking. Vitals was stable, EKG performed, BS was 108. A snack, oxygen, Zofran and tylenol given and pt is now stable and sleeping. Charge nurse aware.

## 2016-07-19 NOTE — Consult Note (Addendum)
[place] Nurse wound consult note Reason for Consult: Consult requested for right great toe chronic wound.  Pt is well-informed regarding plan of care and has been followed by the outpatient wound care center, using Prisma dressing changes every other day and having the callous trimmed during the visits. Pt states he had a traumatic foot injury to this site many years ago which has contributed to the chronic callous location. Wound type: Chronic full thickness callous wound Measurement: .8X.3cm Wound bed: Dry dark brown raised callous Drainage (amount, consistency, odor) No odor or drainage or fluctuance, no osteomyelitis on the xray Periwound: Intact skin surrounding Dressing procedure/placement/frequency: Continue present plan of care as ordered by the outpatient wound care center. Pt's family member may bring in dressing from home [facility]) and apply every other day, then cover with gauze and tape. This topical dressing is not available in the [place]; applied Aquacel, which is a similar product, until the other dressing is available.  Pt can resume follow-up with the outpatient wound care center after discharge. Please re-consult if further assistance is needed.  Thank-you,  Julien Girt MSN, [place], [place], [place], [place]

## 2016-07-19 NOTE — Progress Notes (Signed)
Patient has a wound on his right great toe and stated that he goes to the wound care center about two years now. According to pt , the wound is not getting any better but not worsening either.

## 2016-07-19 NOTE — Progress Notes (Signed)
I have reviewed cor angio and discussed case with Dr. Tamala Julian. He does have 3 VD with mod segmental mid RCA dx, high grade prox LAD disease and LXC marginal dx. He has nl LV Fxn. With elevated SCr staging any intervention is reasonable . His lesions are percutaneously addressable but need to reassess medication compliance ie DAPT. CABG is also a viable option. Will discuss with patient and follow renal fxn for now.Lorretta Harp, M.D., [place], [facility], [place] [facility] [address] [place], [place]  [id number]  [phone] 07/19/2016 3:43 PM

## 2016-07-19 NOTE — Interval H&P Note (Signed)
Cath Lab Visit (complete for each Cath Lab visit)  Clinical Evaluation Leading to the Procedure:   ACS: Yes.    Non-ACS:    Anginal Classification: CCS III  Anti-ischemic medical therapy: Maximal Therapy (2 or more classes of medications)  Non-Invasive Test Results: No non-invasive testing performed  Prior CABG: No previous CABG      History and Physical Interval Note:  07/19/2016 1:50 PM  Joshua [name]  has presented today for surgery, with the diagnosis of cp  The various methods of treatment have been discussed with the patient and family. After consideration of risks, benefits and other options for treatment, the patient has consented to  Procedure(s): Left Heart Cath and Coronary Angiography (N/A) as a surgical intervention .  The patient's history has been reviewed, patient examined, no change in status, stable for surgery.  I have reviewed the patient's chart and labs.  Questions were answered to the patient's satisfaction.     Belva Crome III

## 2016-07-19 NOTE — H&P (View-Only) (Signed)
Subjective:  No recurrent CP on IV hep/NTG  Objective:  Temp:  [98.3 F (36.8 C)-98.9 F (37.2 C)] 98.9 F (37.2 C) (08/07 0544) Pulse Rate:  [86-112] 86 (08/07 0544) Resp:  [13-25] 18 (08/07 0544) BP: (115-153)/(55-91) 130/73 (08/07 0544) SpO2:  [93 %-98 %] 97 % (08/07 0544) Weight:  [317 lb (143.8 kg)-317 lb 7.4 oz (144 kg)] 317 lb 7.4 oz (144 kg) (08/06 2305) Weight change:   Intake/Output from previous day: 08/06 0701 - 08/07 0700 In: -  Out: 675 [Urine:675]  Intake/Output from this shift: No intake/output data recorded.  Physical Exam: General appearance: alert and no distress Neck: no adenopathy, no carotid bruit, no JVD, supple, symmetrical, trachea midline and thyroid not enlarged, symmetric, no tenderness/mass/nodules Lungs: clear to auscultation bilaterally Heart: regular rate and rhythm, S1, S2 normal, no murmur, click, rub or gallop Extremities: extremities normal, atraumatic, no cyanosis or edema  Lab Results: Results for orders placed or performed during the hospital encounter of 07/18/16 (from the past 48 hour(s))  Basic metabolic panel     Status: Abnormal   Collection Time: 07/18/16  4:12 PM  Result Value Ref Range   Sodium 134 (L) 135 - 145 mmol/L   Potassium 3.7 3.5 - 5.1 mmol/L   Chloride 104 101 - 111 mmol/L   CO2 22 22 - 32 mmol/L   Glucose, Bld 200 (H) 65 - 99 mg/dL   BUN 14 6 - 20 mg/dL   Creatinine, Ser 1.34 (H) 0.61 - 1.24 mg/dL   Calcium 8.4 (L) 8.9 - 10.3 mg/dL   GFR calc non Af Amer 57 (L) >60 mL/min   GFR calc Af Amer >60 >60 mL/min    Comment: (NOTE) The eGFR has been calculated using the CKD EPI equation. This calculation has not been validated in all clinical situations. eGFR's persistently <60 mL/min signify possible Chronic Kidney Disease.    Anion gap 8 5 - 15  CBC     Status: None   Collection Time: 07/18/16  4:12 PM  Result Value Ref Range   WBC 10.4 4.0 - 10.5 K/uL   RBC 4.58 4.22 - 5.81 MIL/uL   Hemoglobin 13.0  13.0 - 17.0 g/dL   HCT 40.8 39.0 - 52.0 %   MCV 89.1 78.0 - 100.0 fL   MCH 28.4 26.0 - 34.0 pg   MCHC 31.9 30.0 - 36.0 g/dL   RDW 14.7 11.5 - 15.5 %   Platelets 166 150 - 400 K/uL  I-stat troponin, ED     Status: None   Collection Time: 07/18/16  4:20 PM  Result Value Ref Range   Troponin i, poc 0.08 0.00 - 0.08 ng/mL   Comment 3            Comment: Due to the release kinetics of cTnI, a negative result within the first hours of the onset of symptoms does not rule out myocardial infarction with certainty. If myocardial infarction is still suspected, repeat the test at appropriate intervals.   I-stat troponin, ED     Status: Abnormal   Collection Time: 07/18/16  9:57 PM  Result Value Ref Range   Troponin i, poc 0.22 (HH) 0.00 - 0.08 ng/mL   Comment NOTIFIED PHYSICIAN    Comment 3            Comment: Due to the release kinetics of cTnI, a negative result within the first hours of the onset of symptoms does not rule out myocardial infarction with certainty.  If myocardial infarction is still suspected, repeat the test at appropriate intervals.   Heparin level (unfractionated)     Status: None   Collection Time: 07/18/16 11:57 PM  Result Value Ref Range   Heparin Unfractionated 0.31 0.30 - 0.70 IU/mL    Comment:        IF HEPARIN RESULTS ARE BELOW EXPECTED VALUES, AND PATIENT DOSAGE HAS BEEN CONFIRMED, SUGGEST FOLLOW UP TESTING OF ANTITHROMBIN III LEVELS.   TSH     Status: None   Collection Time: 07/18/16 11:57 PM  Result Value Ref Range   TSH 3.175 0.350 - 4.500 uIU/mL  Troponin I-(serum)     Status: Abnormal   Collection Time: 07/18/16 11:57 PM  Result Value Ref Range   Troponin I 0.24 (HH) <0.03 ng/mL    Comment: CRITICAL RESULT CALLED TO, READ BACK BY AND VERIFIED WITH: FAYE N,RN 07/19/16 0047 WAYK   Glucose, capillary     Status: Abnormal   Collection Time: 07/18/16 11:57 PM  Result Value Ref Range   Glucose-Capillary 157 (H) 65 - 99 mg/dL   Comment 1 Notify  RN    Comment 2 Document in Chart   CBC     Status: Abnormal   Collection Time: 07/19/16  5:08 AM  Result Value Ref Range   WBC 8.2 4.0 - 10.5 K/uL   RBC 4.11 (L) 4.22 - 5.81 MIL/uL   Hemoglobin 11.4 (L) 13.0 - 17.0 g/dL   HCT 36.4 (L) 39.0 - 52.0 %   MCV 88.6 78.0 - 100.0 fL   MCH 27.7 26.0 - 34.0 pg   MCHC 31.3 30.0 - 36.0 g/dL   RDW 14.8 11.5 - 15.5 %   Platelets 148 (L) 150 - 400 K/uL  Troponin I-(serum)     Status: Abnormal   Collection Time: 07/19/16  5:08 AM  Result Value Ref Range   Troponin I 0.18 (HH) <0.03 ng/mL    Comment: CRITICAL VALUE NOTED.  VALUE IS CONSISTENT WITH PREVIOUSLY REPORTED AND CALLED VALUE.  Basic metabolic panel     Status: Abnormal   Collection Time: 07/19/16  5:08 AM  Result Value Ref Range   Sodium 140 135 - 145 mmol/L   Potassium 3.7 3.5 - 5.1 mmol/L   Chloride 109 101 - 111 mmol/L   CO2 25 22 - 32 mmol/L   Glucose, Bld 179 (H) 65 - 99 mg/dL   BUN 15 6 - 20 mg/dL   Creatinine, Ser 1.45 (H) 0.61 - 1.24 mg/dL   Calcium 8.7 (L) 8.9 - 10.3 mg/dL   GFR calc non Af Amer 52 (L) >60 mL/min   GFR calc Af Amer >60 >60 mL/min    Comment: (NOTE) The eGFR has been calculated using the CKD EPI equation. This calculation has not been validated in all clinical situations. eGFR's persistently <60 mL/min signify possible Chronic Kidney Disease.    Anion gap 6 5 - 15  Glucose, capillary     Status: Abnormal   Collection Time: 07/19/16  6:18 AM  Result Value Ref Range   Glucose-Capillary 153 (H) 65 - 99 mg/dL   Comment 1 Notify RN    Comment 2 Document in Chart   Heparin level (unfractionated)     Status: None   Collection Time: 07/19/16  7:50 AM  Result Value Ref Range   Heparin Unfractionated 0.40 0.30 - 0.70 IU/mL    Comment:        IF HEPARIN RESULTS ARE BELOW EXPECTED VALUES, AND PATIENT DOSAGE HAS BEEN  CONFIRMED, SUGGEST FOLLOW UP TESTING OF ANTITHROMBIN III LEVELS.     Imaging: Imaging results have been reviewed  Tele- NSR at  90  Assessment/Plan:   1. Active Problems: 2.   Unstable angina pectoris ([place]) 3. Pos Troponins  Time Spent Directly with Patient:  15 minutes  Length of Stay:  LOS: 0 days   Pt admitted yesterday with [place]. Known CAD s/p stenting 2012 and re look cath 10/16. 2 weeks of recurrent CP, worse on Sunday. SL NTG responsive. Enz mild ly elevated. On IV hep/NTG. No further CP. Exam benign. Labs remarkable for sl increase in SCr 1.46. For Right radial LHC today.  Quay Burow 07/19/2016, 10:19 AM

## 2016-07-19 NOTE — CV Procedure (Signed)
   Progression of disease and OM1 and proximal LAD. Moderately severe mid to distal circumflex not previously treated because it would jail 2 large obtuse marginals.  Intervention not performed today because of anticipated large contrast load on top of CT with contrast earlier today.  Plan to review digital images with colleagues and decide about multilesion PCI versus CABG.

## 2016-07-19 NOTE — Progress Notes (Signed)
Pt is NPO and has 50 unit Humulin and meal coverage. The Humulin was given but the Insulin Aspart is still pending (not given, awaiting for MD advice). The day RN notified.

## 2016-07-20 ENCOUNTER — Other Ambulatory Visit: Payer: Self-pay | Admitting: *Deleted

## 2016-07-20 ENCOUNTER — Inpatient Hospital Stay (HOSPITAL_COMMUNITY): Payer: BLUE CROSS/BLUE SHIELD

## 2016-07-20 DIAGNOSIS — I2510 Atherosclerotic heart disease of native coronary artery without angina pectoris: Secondary | ICD-10-CM

## 2016-07-20 DIAGNOSIS — E785 Hyperlipidemia, unspecified: Secondary | ICD-10-CM

## 2016-07-20 DIAGNOSIS — I5042 Chronic combined systolic (congestive) and diastolic (congestive) heart failure: Secondary | ICD-10-CM

## 2016-07-20 DIAGNOSIS — N289 Disorder of kidney and ureter, unspecified: Secondary | ICD-10-CM

## 2016-07-20 DIAGNOSIS — Z01810 Encounter for preprocedural cardiovascular examination: Secondary | ICD-10-CM

## 2016-07-20 DIAGNOSIS — I25110 Atherosclerotic heart disease of native coronary artery with unstable angina pectoris: Secondary | ICD-10-CM

## 2016-07-20 LAB — BASIC METABOLIC PANEL
Anion gap: 8 (ref 5–15)
BUN: 18 mg/dL (ref 6–20)
CO2: 23 mmol/L (ref 22–32)
Calcium: 8.5 mg/dL — ABNORMAL LOW (ref 8.9–10.3)
Chloride: 108 mmol/L (ref 101–111)
Creatinine, Ser: 1.62 mg/dL — ABNORMAL HIGH (ref 0.61–1.24)
GFR calc Af Amer: 53 mL/min — ABNORMAL LOW (ref >60)
GFR calc non Af Amer: 46 mL/min — ABNORMAL LOW (ref >60)
Glucose, Bld: 107 mg/dL — ABNORMAL HIGH (ref 65–99)
Potassium: 3.6 mmol/L (ref 3.5–5.1)
Sodium: 139 mmol/L (ref 135–145)

## 2016-07-20 LAB — HEPARIN LEVEL (UNFRACTIONATED)
Heparin Unfractionated: 0.18 IU/mL — ABNORMAL LOW (ref 0.30–0.70)
Heparin Unfractionated: 0.37 IU/mL (ref 0.30–0.70)

## 2016-07-20 LAB — GLUCOSE, CAPILLARY
Glucose-Capillary: 102 mg/dL — ABNORMAL HIGH (ref 65–99)
Glucose-Capillary: 169 mg/dL — ABNORMAL HIGH (ref 65–99)
Glucose-Capillary: 131 mg/dL — ABNORMAL HIGH (ref 65–99)
Glucose-Capillary: 135 mg/dL — ABNORMAL HIGH (ref 65–99)

## 2016-07-20 LAB — HEMOGLOBIN A1C
Hgb A1c MFr Bld: 9.5 % — ABNORMAL HIGH (ref 4.8–5.6)
Mean Plasma Glucose: 226 mg/dL

## 2016-07-20 MED ORDER — INSULIN NPH (HUMAN) (ISOPHANE) 100 UNIT/ML ~~LOC~~ SUSP: 20.0000 [IU] | Freq: Every day | SUBCUTANEOUS | Status: DC

## 2016-07-20 MED ORDER — INSULIN NPH (HUMAN) (ISOPHANE) 100 UNIT/ML ~~LOC~~ SUSP
20.0000 [IU] | Freq: Every day | SUBCUTANEOUS | Status: DC
  Administered 2016-07-21 – 2016-07-23 (×3): 20 [IU] via SUBCUTANEOUS
  Filled 2016-07-20 (×2): qty 10

## 2016-07-20 MED ORDER — INSULIN NPH (HUMAN) (ISOPHANE) 100 UNIT/ML ~~LOC~~ SUSP
20.0000 [IU] | Freq: Every day | SUBCUTANEOUS | Status: DC
  Filled 2016-07-20: qty 10

## 2016-07-20 MED ORDER — INSULIN NPH (HUMAN) (ISOPHANE) 100 UNIT/ML ~~LOC~~ SUSP: 60.0000 [IU] | Freq: Every day | SUBCUTANEOUS | Status: DC

## 2016-07-20 NOTE — Consult Note (Signed)
[address]       [place] [address]             [phone]        [facility] Medical Record #[id number] Date of Birth: May 11, 1956  Referring: [facility] Primary Care: [name], MD  Chief Complaint:    Chief Complaint  Patient presents with  . Chest Pain   History of Present Illness:      Mr. Scheib is a 60 yo morbidly obese African American with known history of CAD S/P PCI to LAD in 2012, DM uncontrolled, complicated with gastroparesis, diabetic neuropathy with chronic ulceration of his right great toe, and HTN.  He has been in his usual state of health until the last several weeks.  He has developed complaints of substernal chest discomfort.  This usually occurs at night and awakens the patient from sleep.  However at time of presentation he had developed episodes in the afternoon with radiation to his back and neck.  He took some SL NTG with improvement of symptoms.  He spoke with a PA who instructed him to present to the ED for evaluation. Workup included being ruled out for PE.  He was placed on Heparin and NTG due to his unstable angina symptoms.  He was admitted to Cardiology service overnight.  Currently patient is chest pain free.  There was mention in Dr. Thurman Coyer admission note that patient was suppose to undergo evaluation by GI for possible GI bleeding.  Patient states his Primary physician said he had evidence of bleeding, but patient has never noticed any blood.  He states his diabetes is uncontrolled.  He has been dealing with the ulceration of his right great toe for the past 2 years.  He does not smoke.  Current Activity/ Functional Status: Patient is independent with mobility/ambulation, transfers, ADL's, IADL's.   Zubrod Score: At the time of surgery this patient's most appropriate activity status/level should be described as: $RemoveBefor'[]'OqgwkvKvHiDN$     0    Normal activity, no symptoms $RemoveBef'[x]'zLMakzdDbC$     1    Restricted in physical strenuous  activity but ambulatory, able to do out light work $RemoveBe'[]'ChuSeoSVP$     2    Ambulatory and capable of self care, unable to do work activities, up and about                 more than 50%  Of the time                            '[]'$     3    Only limited self care, in bed greater than 50% of waking hours $RemoveBefo'[]'RFpiaevPdGL$     4    Completely disabled, no self care, confined to bed or chair $Remove'[]'BLLBkHY$     5    Moribund  Past Medical History:  Diagnosis Date  . Chronic combined systolic and diastolic CHF (congestive heart failure) ([place])    a. 07/2015 Echo: EF 45-50%, mod LVH, mid apical/antsept AK, Gr 1 DD.  [name] Coronary artery disease    a. 2012 s/p MI/PCI: s/p DES to mid/dist RCA and overlapping DES to LAD;  b. 07/2015 Cath: LAD 10% ISR, D1 40(jailed), LCX 83m, OM2 30, OM3 30, RCA 43m ISR, 10d ISR.  . Diabetic gastroparesis ([place])   . Essential hypertension   . GERD (gastroesophageal reflux disease)   . Gout   . Heart murmur   .  Hyperlipemia   . Morbid obesity ([place])   . Myocardial infarction ([place]) 2012  . OSA on CPAP   . Polyneuropathy in diabetes(357.2)   . Pulmonary sarcoidosis ([place])    "problems w/it years ago; no problems anymore" (07/03/2014)  . Type II diabetes mellitus ([place])     Past Surgical History:  Procedure Laterality Date  . BREAST LUMPECTOMY Right ~ 2000   "benign"  . CARDIAC CATHETERIZATION N/A 07/30/2015   Procedure: Left Heart Cath and Coronary Angiography;  Surgeon: Burnell Blanks, MD;  Location: [facility];  Service: Cardiovascular;  Laterality: N/A;  . CARDIAC CATHETERIZATION N/A 07/19/2016   Procedure: Left Heart Cath and Coronary Angiography;  Surgeon: Belva Crome, MD;  Location: [facility];  Service: Cardiovascular;  Laterality: N/A;  . CATARACT EXTRACTION W/ INTRAOCULAR LENS  IMPLANT, BILATERAL Bilateral 2014-2015  . CORONARY ANGIOPLASTY WITH STENT PLACEMENT  10/31/2011   30 % MID ATRIOVENTRICULAR GROOVE CX STENOSIS. 90 % MID RCA.PTA TO LAD/STENTING OF TANDEM 60-70%. LAD STENOSIS  99% REDUCED TO 0%.3.0 X 32MM PROMUS DES POSTDILATED TO 3.25MM.  [name] CORONARY ANGIOPLASTY WITH STENT PLACEMENT  07/03/2014   "2"  . I&D EXTREMITY Right 10/30/2013   Procedure: IRRIGATION AND DEBRIDEMENT RIGHT SMALL FINGER POSSIBLE SECONDARY WOUND CLOSURE;  Surgeon: [name], MD;  Location: [facility];  Service: Orthopedics;  Laterality: Right;  [name] is available after 4pm  . I&D EXTREMITY Right 11/01/2013   Procedure:  IRRIGATION AND DEBRIDEMENT RIGHT  PROXIMAL PHALANGEAL LEVEL AMPUTATION;  Surgeon: [name], MD;  Location: [facility];  Service: Orthopedics;  Laterality: Right;  . INCISION AND DRAINAGE Right 10/28/2013   Procedure: INCISION AND DRAINAGE Right small finger;  Surgeon: [name], MD;  Location: [facility];  Service: Orthopedics;  Laterality: Right;  . LEFT HEART CATH Left 11/03/2011   Procedure: LEFT HEART CATH;  Surgeon: [name], MD;  Location: [facility];  Service: Cardiovascular;  Laterality: Left;  . LEFT HEART CATHETERIZATION WITH CORONARY ANGIOGRAM N/A 07/03/2014   Procedure: LEFT HEART CATHETERIZATION WITH CORONARY ANGIOGRAM;  Surgeon: [name], MD;  Location: [facility] CATH LAB;  Service: Cardiovascular;  Laterality: N/A;  . PERCUTANEOUS CORONARY STENT INTERVENTION (PCI-S) Left 11/03/2011   Procedure: PERCUTANEOUS CORONARY STENT INTERVENTION (PCI-S);  Surgeon: [name], MD;  Location: [facility] LAB;  Service: Cardiovascular;  Laterality: Left;  . TOE AMPUTATION Right ~ 2010   "2nd toe"  . TOE AMPUTATION Right 2012   "3rd toe"    History  Smoking Status  . Never Smoker  Smokeless Tobacco  . Never Used    History  Alcohol Use  . Yes    Comment: Pt sts he binge drinks on the weekends    Social History   Social History  . Marital status: Married    Spouse name: N/A  . Number of children: N/A  . Years of education: N/A   Occupational History  . Not on file.   Social History Main Topics  . Smoking status: Never Smoker  .  Smokeless tobacco: Never Used  . Alcohol use Yes     Comment: Pt sts he binge drinks on the weekends  . Drug use: No  . Sexual activity: Yes   Other Topics Concern  . Not on file   Social History Narrative  . No narrative on file    Not on File  Current Facility-Administered Medications  Medication Dose Route Frequency Provider Last Rate Last Dose  .  0.9 %  sodium chloride infusion  250 mL Intravenous PRN Belva Crome, MD      . acetaminophen (TYLENOL) tablet 650 mg  650 mg Oral Q4H PRN Belva Crome, MD   650 mg at 07/20/16 1821  . allopurinol (ZYLOPRIM) tablet 300 mg  300 mg Oral Daily Jacolyn Reedy, MD   300 mg at 07/21/16 1057  . amLODipine (NORVASC) tablet 10 mg  10 mg Oral Daily [name], MD   10 mg at 07/21/16 1056  . aspirin chewable tablet 81 mg  81 mg Oral Daily Belva Crome, MD   81 mg at 07/21/16 1058  . doxazosin (CARDURA) tablet 8 mg  8 mg Oral Daily Jacolyn Reedy, MD   8 mg at 07/21/16 1055  . furosemide (LASIX) tablet 40 mg  40 mg Oral Daily Jacolyn Reedy, MD   40 mg at 07/21/16 1058  . heparin ADULT infusion 100 units/mL (25000 units/240mL sodium chloride 0.45%)  1,850 Units/hr Intravenous Continuous Franky Macho, RPH 18.5 mL/hr at 07/21/16 0539 1,850 Units/hr at 07/21/16 0539  . hydrALAZINE (APRESOLINE) tablet 37.5 mg  37.5 mg Oral BID Jacolyn Reedy, MD   37.5 mg at 07/21/16 1059  . insulin aspart (novoLOG) injection 0-20 Units  0-20 Units Subcutaneous TID WC Jacolyn Reedy, MD   3 Units at 07/20/16 1803  . insulin NPH Human (HUMULIN N,NOVOLIN N) injection 20 Units  20 Units Subcutaneous QHS Bhavinkumar Bhagat, PA       And  . insulin NPH Human (HUMULIN N,NOVOLIN N) injection 20 Units  20 Units Subcutaneous QAC breakfast Bhavinkumar Bhagat, PA      . isosorbide mononitrate (IMDUR) 24 hr tablet 60 mg  60 mg Oral Daily Jacolyn Reedy, MD   60 mg at 07/21/16 1058  . metoprolol succinate (TOPROL-XL) 24 hr tablet 50 mg  50 mg Oral Daily Jacolyn Reedy, MD   50 mg at 07/21/16 1056  . nitroGLYCERIN (NITROSTAT) SL tablet 0.4 mg  0.4 mg Sublingual Q5 Min x 3 PRN Jacolyn Reedy, MD      . nitroGLYCERIN 50 mg in dextrose 5 % 250 mL (0.2 mg/mL) infusion  3-30 mcg/min Intravenous Titrated Jacolyn Reedy, MD 4.5 mL/hr at 07/20/16 2036 15 mcg/min at 07/20/16 2036  . ondansetron (ZOFRAN) injection 4 mg  4 mg Intravenous Q6H PRN Belva Crome, MD   4 mg at 07/19/16 2122  . pantoprazole (PROTONIX) EC tablet 40 mg  40 mg Oral Daily Jacolyn Reedy, MD   40 mg at 07/21/16 1058  . rosuvastatin (CRESTOR) tablet 40 mg  40 mg Oral Daily Jacolyn Reedy, MD   40 mg at 07/21/16 1057  . sodium chloride flush (NS) 0.9 % injection 3 mL  3 mL Intravenous Q12H Belva Crome, MD      . sodium chloride flush (NS) 0.9 % injection 3 mL  3 mL Intravenous PRN Belva Crome, MD        Prescriptions Prior to Admission  Medication Sig Dispense Refill Last Dose  . allopurinol (ZYLOPRIM) 300 MG tablet Take 300 mg by mouth daily.    07/18/2016 at 1530  . amLODipine-olmesartan (AZOR) 10-40 MG tablet Take 1 tablet by mouth daily. 30 tablet 11 07/18/2016 at 1530  . aspirin EC 81 MG tablet Take 81 mg by mouth daily.   07/18/2016 at 1530  . doxazosin (CARDURA) 8 MG tablet Take 8 mg by mouth daily.  5 07/18/2016 at 1530  . furosemide (LASIX) 40 MG tablet TAKE 1 TABLET (40 MG TOTAL) BY MOUTH DAILY. 30 tablet 10 07/18/2016 at 1530  . hydrALAZINE (APRESOLINE) 25 MG tablet Take 1.5 tablets (37.5 mg total) by mouth 2 (two) times daily. (Patient taking differently: Take 25 mg by mouth 2 (two) times daily. ) 90 tablet 11 07/18/2016 at 1530  . isosorbide mononitrate (IMDUR) 60 MG 24 hr tablet Take 1 tablet (60 mg total) by mouth daily. 30 tablet 11 07/18/2016 at 1530  . nitroGLYCERIN (NITROSTAT) 0.4 MG SL tablet Place 1 tablet (0.4 mg total) under the tongue every 5 (five) minutes as needed for chest pain. 25 tablet 3 07/18/2016 at 1400  . NOVOLIN N RELION 100 UNIT/ML injection Inject 50-60 Units  into the skin See admin instructions. 50 units in the morning and 60 units in the evening  0 07/18/2016 at am  . NOVOLIN R RELION 100 UNIT/ML injection Inject 0-10 Units into the skin 3 (three) times daily before meals. Per sliding scale  0 07/16/2016 at 1200  . pantoprazole (PROTONIX) 40 MG tablet Take 1 tablet (40 mg total) by mouth daily. 30 tablet 11 07/18/2016 at am  . rosuvastatin (CRESTOR) 40 MG tablet Take 1 tablet (40 mg total) by mouth daily.   07/18/2016 at 1530  . ticagrelor (BRILINTA) 90 MG TABS tablet Take 90 mg by mouth 2 (two) times daily.   07/18/2016 at 1530  . metoprolol succinate (TOPROL-XL) 100 MG 24 hr tablet Take 1 tablet (100 mg total) by mouth daily. Take with or immediately following a meal. (Patient not taking: Reported on 07/18/2016) 30 tablet 6 Not Taking at Unknown time  . ticagrelor (BRILINTA) 60 MG TABS tablet Take 1 tablet (60 mg total) by mouth 2 (two) times daily. (Patient not taking: Reported on 07/18/2016) 60 tablet 11 Not Taking at Unknown time    Family History  Problem Relation Age of Onset  . Heart attack Maternal Aunt   . Diabetes Maternal Grandmother   . Hypertension Maternal Grandmother      Review of Systems:      Cardiac Review of Systems: Y or N  Chest Pain [ y   ]  Resting SOB [   ] Exertional SOB  [ y ]  Orthopnea [  ]   Pedal Edema [   ]    Palpitations [  ] Syncope  [  ]   Presyncope [   ]  General Review of Systems: [Y] = yes [  ]=no Constitional: recent weight change [ y ]; anorexia [  ]; fatigue [  ]; nausea [  ]; night sweats [  ]; fever [  ]; or chills [  ]                                                               Dental: poor dentition[  ]; Last Dentist visit:   Eye : blurred vision [  ]; diplopia [   ]; vision changes [  ];  Amaurosis fugax[  ]; Resp: cough [  ];  wheezing[  ];  hemoptysis[  ]; shortness of breath[y  ]; paroxysmal nocturnal dyspnea[  ]; dyspnea on exertion[  ]; or orthopnea[  ];  GI:  gallstones[  ],  vomiting[  ];   dysphagia[  ]; melena[  ];  hematochezia [  ]; heartburn[  ];   Hx of  Colonoscopy[  ]; GU: kidney stones [  ]; hematuria[  ];   dysuria [  ];  nocturia[  ];  history of     obstruction [  ]; urinary frequency [  ]             Skin: rash, swelling[  ];, hair loss[  ];  peripheral edema[  ];  or itching[  ]; Musculosketetal: myalgias[  ];  joint swelling[  ];  joint erythema[  ];  joint pain[  ];  back pain[  ];  Heme/Lymph: bruising[  ];  bleeding[  ];  anemia[  ];  Neuro: TIA[  ];  headaches[  ];  stroke[n  ];  vertigo[  ];  seizures[  ];   paresthesias[  ];  difficulty walking[  ];  Psych:depression[  ]; anxiety[  ];  Endocrine: diabetes[ y ];  thyroid dysfunction[  ];  Immunizations: Flu [  ]; Pneumococcal[  ];  Other:  Physical Exam: BP 127/74   Pulse 96   Temp 99.4 F (37.4 C) (Oral)   Resp 20   Ht $R'5\' 9"'UQ$  (1.753 m)   Wt (!) 317 lb 7.4 oz (144 kg)   SpO2 97%   BMI 46.88 kg/m   General appearance: alert, cooperative, no distress and morbidly obese Head: Normocephalic, without obvious abnormality, atraumatic Neck: no adenopathy, no carotid bruit, no JVD, supple, symmetrical, trachea midline and thyroid not enlarged, symmetric, no tenderness/mass/nodules Resp: clear to auscultation bilaterally Cardio: regular rate and rhythm GI: soft, non-tender; bowel sounds normal; no masses,  no organomegaly Extremities: extremities normal, atraumatic, no cyanosis or edema and ulceration on Right great toe Neurologic: Grossly normal  Diagnostic Studies & Laboratory data:     Recent Radiology Findings:  Dg Chest 2 View  Result Date: 07/18/2016 CLINICAL DATA:  Left-sided chest pain for 2 weeks, initial encounter EXAM: CHEST  2 VIEW COMPARISON:  06/23/2016 FINDINGS: Cardiac shadow is at the upper limits of normal in size. Multiple calcified granulomas are noted consistent with prior granulomatous disease. No focal confluent infiltrate is seen. Mild bibasilar scarring is again noted. Degenerative  change of the thoracic spine is seen. IMPRESSION: No acute abnormality noted. Electronically Signed   By: Inez Catalina M.D.   On: 07/18/2016 16:59   Dg Chest 2 View  Result Date: 06/23/2016 CLINICAL DATA:  Short of breath EXAM: CHEST  2 VIEW COMPARISON:  07/29/2015 FINDINGS: COPD with pulmonary hyperinflation.  Bibasilar scarring unchanged Negative for pneumonia. Negative for heart failure or effusion. No mass lesion. IMPRESSION: COPD.  No acute abnormality. Electronically Signed   By: Franchot Gallo M.D.   On: 06/23/2016 14:32   Ct Angio Chest Pe W And/or Wo Contrast  Result Date: 07/18/2016 CLINICAL DATA:  Chest discomfort for several days, worse last night and today. Burning and tightness. EXAM: CT ANGIOGRAPHY CHEST WITH CONTRAST TECHNIQUE: Multidetector CT imaging of the chest was performed using the standard protocol during bolus administration of intravenous contrast. Multiplanar CT image reconstructions and MIPs were obtained to evaluate the vascular anatomy. CONTRAST:  100 cc Isovue 370. COMPARISON:  Plain films of the chest today and 10/28/2013. FINDINGS: Evaluation for pulmonary embolus is limited by bolus timing. The study was not repeated as the patient has renal insufficiency and mildly decreased GFR. No central or proximal segmental pulmonary embolus is identified. There is cardiomegaly. Extensive  calcific coronary artery disease is seen. Multiple calcified mediastinal and hilar lymph nodes are noted. No axillary lymphadenopathy. No consolidative process is identified. There is some reticulation of lung parenchyma best seen in the right middle lobe and left lung base. No nodule is identified. Visualized upper abdomen demonstrates no focal abnormality. No lytic or sclerotic bony lesions identified. Review of the MIP images confirms the above findings. IMPRESSION: Although the study is somewhat limited by bolus timing. No pulmonary embolus is seen. Calcified mediastinal and hilar lymph nodes could  be due to old granulomatous disease or sarcoid. There is mild reticulation of pulmonary parenchyma best seen in the right middle lobe which may be due to early pulmonary changes of sarcoidosis. Cardiomegaly. Calcific aortic and coronary atherosclerosis. Electronically Signed   By: Inge Rise M.D.   On: 07/18/2016 20:02   Dg Foot Complete Right  Result Date: 07/16/2016 CLINICAL DATA:  Osteomyelitis great toe EXAM: RIGHT FOOT COMPLETE - 3+ VIEW COMPARISON:  01/23/2016 FINDINGS: Soft tissue swelling and ulcer of the great toe. Negative for osteomyelitis. No fracture. Amputation of the second and third toes without osteomyelitis. Fourth and fifth toes appear intact Degenerative change in the midfoot.  Calcaneal spurring. IMPRESSION: Negative for osteomyelitis. Electronically Signed   By: Franchot Gallo M.D.   On: 07/16/2016 11:49   I have independently reviewed the above radiologic studies.   CATH report : Belva Crome, MD (Primary)    Procedures   Left Heart Cath and Coronary Angiography  Conclusion     Mid RCA lesion, 50 %stenosed.  Mid RCA to Dist RCA lesion, 10 %stenosed.  RPDA lesion, 30 %stenosed.  3rd Mrg lesion, 30 %stenosed.  Prox LAD to Mid LAD lesion, 10 %stenosed.  Ost 1st Diag to 1st Diag lesion, 40 %stenosed.  Dist LAD lesion, 10 %stenosed.  Mid Cx lesion, 75 %stenosed.  Ost LAD to Prox LAD lesion, 85 %stenosed.  2nd Mrg lesion, 95 %stenosed.  The left ventricular ejection fraction is 50-55% by visual estimate.  The left ventricular systolic function is normal.  LV end diastolic pressure is mildly elevated.    New eccentric proximal 80% LAD not involving the previously placed overlapping mid LAD stents. The vessel distal to the stents is widely patent.  Eccentric 75% mid circumflex stenosis between OM1 and OM 2.  Progression of obtuse marginal #1 from 50% obstruction to 95% obstruction (culprit vessel for presentation).  Mild to moderate diffuse mid  RCA disease and diffuse 40-50% narrowing within the previously placed distal RCA stent.  Left ventricular diastolic dysfunction with EF 50% an EDP 19 mmHg.  Acute on chronic kidney injury with creatinine 1.45 today up from 1.34 on admission.  Recommendation:   [facility] PCI versus elective CABG in this diabetic with multiple prior stents and questionable compliance with dual antiplatelet therapy. Discussed with Dr. Gwenlyn Found. I have not contacted TCTS.  IV hydration; check kidney function in a.m; hold ARB therapy.  If PCI chosen, I could perform the procedure on Wednesday or Thursday morning.     I have independently reviewed the above  cath films and reviewed the findings with the  patient . I have reviewed cath films with Dr Tamala Julian  Recent Lab Findings: Lab Results  Component Value Date   WBC 10.0 07/21/2016   HGB 11.0 (L) 07/21/2016   HCT 35.3 (L) 07/21/2016   PLT 142 (L) 07/21/2016   GLUCOSE 107 (H) 07/20/2016   CHOL 205 (H) 02/16/2016   TRIG 101 02/16/2016  HDL 102 02/16/2016   LDLCALC 83 02/16/2016   ALT 23 02/16/2016   AST 20 02/16/2016   NA 139 07/20/2016   K 3.6 07/20/2016   CL 108 07/20/2016   CREATININE 1.62 (H) 07/20/2016   BUN 18 07/20/2016   CO2 23 07/20/2016   TSH 3.175 07/18/2016   INR 1.00 07/19/2016   HGBA1C 9.5 (H) 07/18/2016   Assessment / Plan:    1. CAD-  With progression of om lesion and proximal lad, not clear what is ideal treatment , from technical point either PCI or CABG is possible. With obesity , chronic renal disease , non heal distal extremity ulcers CABG carries hight then usual risk. The filma have been reviewed with Dr Tamala Julian. Patient concerned about proceeding with CABG. Will check cr and pfts today , then discussed with patent PCI tomorrow after reloading with berlinta  vs CABG to lad and OM1 next week. 2. DM-  Poor  Controlled hgb from 16 to 9.5  now , complicated by chronic renal disease and coronary artery disease and  peripheral  vascular disease with non healing ulcer right foot  3.possibly  Pulmonary sarcoid based on Calcified mediastinal and hilar lymph nodes could be due to old granulomatous disease or sarcoid. There is mild reticulation of pulmonary parenchyma best seen in the right middle lobe which may be due to early pulmonary changes of sarcoidosis 4. COPD- full pft pending   5. HGB  Stable from 02/2016 until admission doubt GI bleed, patient denies any obvious gi  Blood- no evidence of bleeding on heparin    I  spent 40 minutes counseling the patient face to face and 50% or more the  time was spent in counseling and coordination of care. The total time spent in the appointment was 60 minutes.  Grace Isaac MD      [address] [place] [address] Office [phone]   [place]

## 2016-07-20 NOTE — Progress Notes (Signed)
ANTICOAGULATION CONSULT NOTE - Follow Up Consult  Pharmacy Consult for Heparin  Indication: 3V CAD  Not on File  Patient Measurements: Height: 5\' 9"  (175.3 cm) Weight: (!) 317 lb 7.4 oz (144 kg) IBW/kg (Calculated) : 70.7  Heparin dosing et: 105kg  Vital Signs: Temp: 98.3 F (36.8 C) (08/08 1209) Temp Source: Oral (08/08 1209) BP: 141/66 (08/08 1209) Pulse Rate: 91 (08/08 1209)  Labs:  Recent Labs  07/18/16 1612  07/18/16 2357 07/19/16 0508 07/19/16 0750 07/19/16 1105 07/19/16 1253 07/20/16 0410 07/20/16 1058 07/20/16 1902  HGB 13.0  --   --  11.4*  --   --   --   --   --   --   HCT 40.8  --   --  36.4*  --   --   --   --   --   --   PLT 166  --   --  148*  --   --   --   --   --   --   LABPROT  --   --   --   --   --  13.2  --   --   --   --   INR  --   --   --   --   --  1.00  --   --   --   --   HEPARINUNFRC  --   < > 0.31  --  0.40  --   --   --  0.18* 0.37  CREATININE 1.34*  --   --  1.45*  --   --   --  1.62*  --   --   TROPONINI  --   --  0.24* 0.18*  --   --  0.16*  --   --   --   < > = values in this interval not displayed.  Estimated Creatinine Clearance: 71.2 mL/min (by C-G formula based on SCr of 1.62 mg/dL).  Assessment: 60 yo male s/p cath which showed 3V CAD. Heparin res-started post cath and plans for staged intervention vs CABG.   Heparin level 0.37 after most recent rate increase.   Goal of Therapy:  Heparin level 0.3-0.7 units/ml Monitor platelets by anticoagulation protocol: Yes   Plan:  -Continue heparin at 1650 units/hr -Heparin level in am to serve as confirmatory  -Daily HL and CBC   Vincenza Hews, PharmD, BCPS 07/20/2016, 7:50 PM Pager: [phone]

## 2016-07-20 NOTE — Progress Notes (Signed)
Bhagat, B. PA notified of the hematoma observed on patient's right groin, will continue to monitor

## 2016-07-20 NOTE — Progress Notes (Addendum)
Subjective:  No further CP and IV hep/NTG  Objective:  Temp:  [98.3 F (36.8 C)-99.8 F (37.7 C)] 98.3 F (36.8 C) (08/08 1008) Pulse Rate:  [88-281] 89 (08/08 1008) Resp:  [0-28] 18 (08/08 1008) BP: (108-156)/(47-85) 140/76 (08/08 1008) SpO2:  [0 %-100 %] 98 % (08/08 1008) Weight change:   Intake/Output from previous day: 08/07 0701 - 08/08 0700 In: 582 [P.O.:360; I.V.:222] Out: 550 [Urine:550]  Intake/Output from this shift: No intake/output data recorded.  Physical Exam: General appearance: alert and no distress Neck: no adenopathy, no carotid bruit, no JVD, supple, symmetrical, trachea midline and thyroid not enlarged, symmetric, no tenderness/mass/nodules Lungs: clear to auscultation bilaterally Heart: regular rate and rhythm, S1, S2 normal, no murmur, click, rub or gallop Extremities: extremities normal, atraumatic, no cyanosis or edema  Lab Results: Results for orders placed or performed during the hospital encounter of 07/18/16 (from the past 48 hour(s))  Basic metabolic panel     Status: Abnormal   Collection Time: 07/18/16  4:12 PM  Result Value Ref Range   Sodium 134 (L) 135 - 145 mmol/L   Potassium 3.7 3.5 - 5.1 mmol/L   Chloride 104 101 - 111 mmol/L   CO2 22 22 - 32 mmol/L   Glucose, Bld 200 (H) 65 - 99 mg/dL   BUN 14 6 - 20 mg/dL   Creatinine, Ser 1.34 (H) 0.61 - 1.24 mg/dL   Calcium 8.4 (L) 8.9 - 10.3 mg/dL   GFR calc non Af Amer 57 (L) >60 mL/min   GFR calc Af Amer >60 >60 mL/min    Comment: (NOTE) The eGFR has been calculated using the CKD EPI equation. This calculation has not been validated in all clinical situations. eGFR's persistently <60 mL/min signify possible Chronic Kidney Disease.    Anion gap 8 5 - 15  CBC     Status: None   Collection Time: 07/18/16  4:12 PM  Result Value Ref Range   WBC 10.4 4.0 - 10.5 K/uL   RBC 4.58 4.22 - 5.81 MIL/uL   Hemoglobin 13.0 13.0 - 17.0 g/dL   HCT 40.8 39.0 - 52.0 %   MCV 89.1 78.0 - 100.0  fL   MCH 28.4 26.0 - 34.0 pg   MCHC 31.9 30.0 - 36.0 g/dL   RDW 14.7 11.5 - 15.5 %   Platelets 166 150 - 400 K/uL  I-stat troponin, ED     Status: None   Collection Time: 07/18/16  4:20 PM  Result Value Ref Range   Troponin i, poc 0.08 0.00 - 0.08 ng/mL   Comment 3            Comment: Due to the release kinetics of cTnI, a negative result within the first hours of the onset of symptoms does not rule out myocardial infarction with certainty. If myocardial infarction is still suspected, repeat the test at appropriate intervals.   I-stat troponin, ED     Status: Abnormal   Collection Time: 07/18/16  9:57 PM  Result Value Ref Range   Troponin i, poc 0.22 (HH) 0.00 - 0.08 ng/mL   Comment NOTIFIED PHYSICIAN    Comment 3            Comment: Due to the release kinetics of cTnI, a negative result within the first hours of the onset of symptoms does not rule out myocardial infarction with certainty. If myocardial infarction is still suspected, repeat the test at appropriate intervals.   Heparin level (unfractionated)  Status: None   Collection Time: 07/18/16 11:57 PM  Result Value Ref Range   Heparin Unfractionated 0.31 0.30 - 0.70 IU/mL    Comment:        IF HEPARIN RESULTS ARE BELOW EXPECTED VALUES, AND PATIENT DOSAGE HAS BEEN CONFIRMED, SUGGEST FOLLOW UP TESTING OF ANTITHROMBIN III LEVELS.   TSH     Status: None   Collection Time: 07/18/16 11:57 PM  Result Value Ref Range   TSH 3.175 0.350 - 4.500 uIU/mL  Troponin I-(serum)     Status: Abnormal   Collection Time: 07/18/16 11:57 PM  Result Value Ref Range   Troponin I 0.24 (HH) <0.03 ng/mL    Comment: CRITICAL RESULT CALLED TO, READ BACK BY AND VERIFIED WITH: FAYE N,RN 07/19/16 0047 WAYK   Hemoglobin A1c     Status: Abnormal   Collection Time: 07/18/16 11:57 PM  Result Value Ref Range   Hgb A1c MFr Bld 9.5 (H) 4.8 - 5.6 %    Comment: (NOTE)         Pre-diabetes: 5.7 - 6.4         Diabetes: >6.4         Glycemic  control for adults with diabetes: <7.0    Mean Plasma Glucose 226 mg/dL    Comment: (NOTE) Performed At: [name] [place], [place] [id number] Lindon Romp MD AO:[id number]   Glucose, capillary     Status: Abnormal   Collection Time: 07/18/16 11:57 PM  Result Value Ref Range   Glucose-Capillary 157 (H) 65 - 99 mg/dL   Comment 1 Notify RN    Comment 2 Document in Chart   CBC     Status: Abnormal   Collection Time: 07/19/16  5:08 AM  Result Value Ref Range   WBC 8.2 4.0 - 10.5 K/uL   RBC 4.11 (L) 4.22 - 5.81 MIL/uL   Hemoglobin 11.4 (L) 13.0 - 17.0 g/dL   HCT 36.4 (L) 39.0 - 52.0 %   MCV 88.6 78.0 - 100.0 fL   MCH 27.7 26.0 - 34.0 pg   MCHC 31.3 30.0 - 36.0 g/dL   RDW 14.8 11.5 - 15.5 %   Platelets 148 (L) 150 - 400 K/uL  Troponin I-(serum)     Status: Abnormal   Collection Time: 07/19/16  5:08 AM  Result Value Ref Range   Troponin I 0.18 (HH) <0.03 ng/mL    Comment: CRITICAL VALUE NOTED.  VALUE IS CONSISTENT WITH PREVIOUSLY REPORTED AND CALLED VALUE.  Basic metabolic panel     Status: Abnormal   Collection Time: 07/19/16  5:08 AM  Result Value Ref Range   Sodium 140 135 - 145 mmol/L   Potassium 3.7 3.5 - 5.1 mmol/L   Chloride 109 101 - 111 mmol/L   CO2 25 22 - 32 mmol/L   Glucose, Bld 179 (H) 65 - 99 mg/dL   BUN 15 6 - 20 mg/dL   Creatinine, Ser 1.45 (H) 0.61 - 1.24 mg/dL   Calcium 8.7 (L) 8.9 - 10.3 mg/dL   GFR calc non Af Amer 52 (L) >60 mL/min   GFR calc Af Amer >60 >60 mL/min    Comment: (NOTE) The eGFR has been calculated using the CKD EPI equation. This calculation has not been validated in all clinical situations. eGFR's persistently <60 mL/min signify possible Chronic Kidney Disease.    Anion gap 6 5 - 15  Glucose, capillary     Status: Abnormal   Collection Time: 07/19/16  6:18 AM  Result Value Ref Range   Glucose-Capillary 153 (H) 65 - 99 mg/dL   Comment 1 Notify RN    Comment 2 Document in Chart   Heparin level  (unfractionated)     Status: None   Collection Time: 07/19/16  7:50 AM  Result Value Ref Range   Heparin Unfractionated 0.40 0.30 - 0.70 IU/mL    Comment:        IF HEPARIN RESULTS ARE BELOW EXPECTED VALUES, AND PATIENT DOSAGE HAS BEEN CONFIRMED, SUGGEST FOLLOW UP TESTING OF ANTITHROMBIN III LEVELS.   Protime-INR     Status: None   Collection Time: 07/19/16 11:05 AM  Result Value Ref Range   Prothrombin Time 13.2 11.4 - 15.2 seconds   INR 1.00   Glucose, capillary     Status: None   Collection Time: 07/19/16 11:30 AM  Result Value Ref Range   Glucose-Capillary 73 65 - 99 mg/dL  Glucose, capillary     Status: None   Collection Time: 07/19/16 12:32 PM  Result Value Ref Range   Glucose-Capillary 84 65 - 99 mg/dL  Troponin I     Status: Abnormal   Collection Time: 07/19/16 12:53 PM  Result Value Ref Range   Troponin I 0.16 (HH) <0.03 ng/mL    Comment: CRITICAL VALUE NOTED.  VALUE IS CONSISTENT WITH PREVIOUSLY REPORTED AND CALLED VALUE.  Glucose, capillary     Status: None   Collection Time: 07/19/16  4:32 PM  Result Value Ref Range   Glucose-Capillary 86 65 - 99 mg/dL  Glucose, capillary     Status: Abnormal   Collection Time: 07/19/16  9:03 PM  Result Value Ref Range   Glucose-Capillary 108 (H) 65 - 99 mg/dL  Basic metabolic panel     Status: Abnormal   Collection Time: 07/20/16  4:10 AM  Result Value Ref Range   Sodium 139 135 - 145 mmol/L   Potassium 3.6 3.5 - 5.1 mmol/L   Chloride 108 101 - 111 mmol/L   CO2 23 22 - 32 mmol/L   Glucose, Bld 107 (H) 65 - 99 mg/dL   BUN 18 6 - 20 mg/dL   Creatinine, Ser 1.62 (H) 0.61 - 1.24 mg/dL   Calcium 8.5 (L) 8.9 - 10.3 mg/dL   GFR calc non Af Amer 46 (L) >60 mL/min   GFR calc Af Amer 53 (L) >60 mL/min    Comment: (NOTE) The eGFR has been calculated using the CKD EPI equation. This calculation has not been validated in all clinical situations. eGFR's persistently <60 mL/min signify possible Chronic Kidney Disease.    Anion  gap 8 5 - 15  Glucose, capillary     Status: Abnormal   Collection Time: 07/20/16  6:13 AM  Result Value Ref Range   Glucose-Capillary 102 (H) 65 - 99 mg/dL  Glucose, capillary     Status: Abnormal   Collection Time: 07/20/16 11:29 AM  Result Value Ref Range   Glucose-Capillary 169 (H) 65 - 99 mg/dL   Comment 1 Notify RN    Comment 2 Document in Chart     Imaging: Imaging results have been reviewed  Tele- NSR in 80s  Assessment/Plan:   1. Principal Problem: 2.   Unstable angina pectoris ([place]) 3. Active Problems: 4.   CAD in native artery 5.   Chronic combined systolic and diastolic CHF (congestive heart failure) ([place]) 6.   Coronary artery disease 7.   Hyperlipemia 8.   Renal insufficiency 9.   Time Spent Directly with Patient:  20 minutes  Length of Stay:  LOS: 1 day   S/P cath with 3 VD, Nl LV Fxn. Prior PCI/DES. Accelerating [place]. On IV hep/NTG. No further CP. Brilenta on hold in anticipation of CABG. TCTS to see today. Exam benign. Labs OK except for SCr 1.6 (elevated since cath). Will follow.  Quay Burow 07/20/2016, 11:36 AM

## 2016-07-20 NOTE — Progress Notes (Addendum)
Heparin infusion initiated at 1450 units/hr per Pharmacy dosing.  2nd RN verification by Jairo Ben at bedside.  Teaching performed and patient verbalized understanding.  Infusing without difficulty via right hand PIV.  Will continue to monitor.

## 2016-07-20 NOTE — Progress Notes (Signed)
ANTICOAGULATION CONSULT NOTE - Follow Up Consult  Pharmacy Consult for Heparin  Indication: 3V CAD  Not on File  Patient Measurements: Height: 5\' 9"  (175.3 cm) Weight: (!) 317 lb 7.4 oz (144 kg) IBW/kg (Calculated) : 70.7  Heparin dosing et: 105kg  Vital Signs: Temp: 98.3 F (36.8 C) (08/08 1008) Temp Source: Oral (08/08 1008) BP: 140/76 (08/08 1008) Pulse Rate: 89 (08/08 1008)  Labs:  Recent Labs  07/18/16 1612 07/18/16 2357 07/19/16 0508 07/19/16 0750 07/19/16 1105 07/19/16 1253 07/20/16 0410 07/20/16 1058  HGB 13.0  --  11.4*  --   --   --   --   --   HCT 40.8  --  36.4*  --   --   --   --   --   PLT 166  --  148*  --   --   --   --   --   LABPROT  --   --   --   --  13.2  --   --   --   INR  --   --   --   --  1.00  --   --   --   HEPARINUNFRC  --  0.31  --  0.40  --   --   --  0.18*  CREATININE 1.34*  --  1.45*  --   --   --  1.62*  --   TROPONINI  --  0.24* 0.18*  --   --  0.16*  --   --     Estimated Creatinine Clearance: 71.2 mL/min (by C-G formula based on SCr of 1.62 mg/dL).  Assessment: 60 yo male s/p cath which showed 3V CAD. Heparin res-started post cath and plans for staged intervention vs CABG.  -heparin level = 0.18 on 1450 units/hr  Goal of Therapy:  Heparin level 0.3-0.7 units/ml Monitor platelets by anticoagulation protocol: Yes   Plan:  -Increase heparin to 1650 units/hr -Heparin level in 6 hours and daily wth CBC daily  Hildred Laser, Pharm D 07/20/2016 12:09 PM

## 2016-07-20 NOTE — Progress Notes (Addendum)
Pre-op Cardiac Surgery  Carotid Findings:  Findings are consistent with a 1-39 percent stenosis involving the right internal carotid artery and the left internal carotid artery. Vertebral arteries demonstrate antegrade flow.    Upper Extremity Right Left  Brachial Pressures 192/Triphasic 190/Triphasic  Radial Waveforms Triphasic Triphasic  Ulnar Waveforms Triphasic Triphasic  Palmar Arch (Allen's Test) Signal unaffected with radial compression and decreases greater than 50% with ulnar compression Signal unaffected with radial compression and decreases greater than 50% with ulnar compression      Lower  Extremity Right Left  Dorsalis Pedis 164/Biphasic 201/Monophasic  Posterior Tibial 172/Monophasic 202/Biphasic  Ankle/Brachial Indices 0.9 1.05    Findings: Right ABI is suggestive of mild arterial disease at rest and the left is within normal limits at rest but may be falsely elevated due to medial calcification.   07/21/16 10:05 AM Carlos Levering RVT

## 2016-07-20 NOTE — Progress Notes (Addendum)
Patient complaining of aching back pain radiating towards his right hip rated as 6/10, pain not relieved with repositioning, 650mg  ot tylenol given, placed on O2 2L for SOB,  Bhagat B, PA notified, patient sitting quietly in the bed, family at bedside, call light within reach will continue to monitor

## 2016-07-20 NOTE — Progress Notes (Signed)
Inpatient Diabetes Program Recommendations  AACE/ADA: New Consensus Statement on Inpatient Glycemic Control (2015)  Target Ranges:  Prepandial:   less than 140 mg/dL      Peak postprandial:   less than 180 mg/dL (1-2 hours)      Critically ill patients:  140 - 180 mg/dL   Lab Results  Component Value Date   GLUCAP 102 (H) 07/20/2016   HGBA1C 9.5 (H) 07/18/2016    Review of Glycemic Control  Results for Joshua [name], Joshua [name] (MRN [id number]) as of 07/20/2016 09:20  Ref. Range 07/19/2016 11:30 07/19/2016 12:32 07/19/2016 16:32 07/19/2016 21:03 07/20/2016 06:13  Glucose-Capillary Latest Ref Range: 65 - 99 mg/dL 73 84 86 108 (H) 102 (H)    Diabetes history: Type 2 Outpatient Diabetes medications: Humulin 70/30 40 units bid Current orders for Inpatient glycemic control: NPH 50 units qam, NPH 60 units qhs, Novolog resistant correction  0-20 units tid  Inpatient Diabetes Program Recommendations:  Patient refused to take the NPH last night and his fasting blood sugar today was $RemoveB'102mg'AMFHZjDA$ /dl. I would not give him any NPH tonight but starting tomorrow am, please consider decreasing NPH to 20 units bid. (basal rate in his home dose of 70/30 insulin is 28 units bid)  Gentry Fitz, RN, [place], [place], CDE Diabetes Coordinator Inpatient Diabetes Program  [phone] (Team Pager) [phone] ([place]) 07/20/2016 9:29 AM

## 2016-07-20 NOTE — Care Management Note (Signed)
Case Management Note Marvetta Gibbons RN, BSN Unit 2W-Case Manager [phone]  Patient Details  Name: Joshua [name] MRN: [id number] Date of Birth: 1956/04/29  Subjective/Objective:   Pt admitted with [place], s/p cath with 3VD- surgery consulted for possible CABG                 Action/Plan: PTA PT lived at home- consult received for medication needs- pt was on Brilinta as some point but stopped taking- pt has insurance- will not qualify for [facility]- or pt assistance- CM to follow and speak with pt regarding medication non-compliance issues pending plan for ?CABG  Expected Discharge Date:                Expected Discharge Plan:  Home/Self Care  In-House Referral:     Discharge planning Services  CM Consult, Medication Assistance  Post Acute Care Choice:    Choice offered to:     DME Arranged:    DME Agency:     HH Arranged:    HH Agency:     Status of Service:  In process, will continue to follow  If discussed at Long Length of Stay Meetings, dates discussed:    Additional Comments:  Dawayne Patricia, RN 07/20/2016, 11:14 AM

## 2016-07-21 ENCOUNTER — Encounter (HOSPITAL_COMMUNITY): Payer: Self-pay

## 2016-07-21 DIAGNOSIS — I2510 Atherosclerotic heart disease of native coronary artery without angina pectoris: Secondary | ICD-10-CM

## 2016-07-21 LAB — VAS US DOPPLER PRE CABG
LEFT ECA DIAS: -20 cm/s
LEFT VERTEBRAL DIAS: -18 cm/s
Left CCA prox dias: 15 cm/s
Left CCA prox sys: 113 cm/s
Left ICA dist dias: -19 cm/s
Left ICA dist sys: -83 cm/s
Left ICA prox dias: -17 cm/s
Left ICA prox sys: -79 cm/s
RIGHT ECA DIAS: -1 cm/s
RIGHT VERTEBRAL DIAS: 3 cm/s
Right CCA prox dias: 12 cm/s
Right CCA prox sys: 105 cm/s
Right cca dist sys: -68 cm/s

## 2016-07-21 LAB — BASIC METABOLIC PANEL
Anion gap: 8 (ref 5–15)
BUN: 17 mg/dL (ref 6–20)
CO2: 21 mmol/L — ABNORMAL LOW (ref 22–32)
Calcium: 8.9 mg/dL (ref 8.9–10.3)
Chloride: 105 mmol/L (ref 101–111)
Creatinine, Ser: 1.61 mg/dL — ABNORMAL HIGH (ref 0.61–1.24)
GFR calc Af Amer: 53 mL/min — ABNORMAL LOW (ref >60)
GFR calc non Af Amer: 46 mL/min — ABNORMAL LOW (ref >60)
Glucose, Bld: 152 mg/dL — ABNORMAL HIGH (ref 65–99)
Potassium: 3.7 mmol/L (ref 3.5–5.1)
Sodium: 134 mmol/L — ABNORMAL LOW (ref 135–145)

## 2016-07-21 LAB — HEPARIN LEVEL (UNFRACTIONATED)
Heparin Unfractionated: 0.29 IU/mL — ABNORMAL LOW (ref 0.30–0.70)
Heparin Unfractionated: 0.4 IU/mL (ref 0.30–0.70)

## 2016-07-21 LAB — CBC
HCT: 35.3 % — ABNORMAL LOW (ref 39.0–52.0)
Hemoglobin: 11 g/dL — ABNORMAL LOW (ref 13.0–17.0)
MCH: 27.6 pg (ref 26.0–34.0)
MCHC: 31.2 g/dL (ref 30.0–36.0)
MCV: 88.7 fL (ref 78.0–100.0)
Platelets: 142 10*3/uL — ABNORMAL LOW (ref 150–400)
RBC: 3.98 MIL/uL — ABNORMAL LOW (ref 4.22–5.81)
RDW: 14.6 % (ref 11.5–15.5)
WBC: 10 10*3/uL (ref 4.0–10.5)

## 2016-07-21 LAB — GLUCOSE, CAPILLARY
Glucose-Capillary: 77 mg/dL (ref 65–99)
Glucose-Capillary: 147 mg/dL — ABNORMAL HIGH (ref 65–99)
Glucose-Capillary: 222 mg/dL — ABNORMAL HIGH (ref 65–99)
Glucose-Capillary: 165 mg/dL — ABNORMAL HIGH (ref 65–99)

## 2016-07-21 MED ORDER — SODIUM CHLORIDE 0.9 % WEIGHT BASED INFUSION
1.0000 mL/kg/h | INTRAVENOUS | Status: DC
  Administered 2016-07-21 – 2016-07-23 (×6): 1 mL/kg/h via INTRAVENOUS

## 2016-07-21 MED ORDER — SODIUM CHLORIDE 0.9% FLUSH: 3.0000 mL | Freq: Two times a day (BID) | INTRAVENOUS | Status: DC

## 2016-07-21 MED ORDER — SODIUM CHLORIDE 0.9 % IV SOLN: 250.0000 mL | INTRAVENOUS | Status: DC | PRN

## 2016-07-21 MED ORDER — SODIUM CHLORIDE 0.9% FLUSH: 3.0000 mL | INTRAVENOUS | Status: DC | PRN

## 2016-07-21 MED ORDER — ASPIRIN 81 MG PO CHEW
81.0000 mg | CHEWABLE_TABLET | ORAL | Status: AC
  Administered 2016-07-22: 81 mg via ORAL
  Filled 2016-07-21: qty 1

## 2016-07-21 MED ORDER — TICAGRELOR 90 MG PO TABS
180.0000 mg | ORAL_TABLET | Freq: Once | ORAL | Status: AC
  Administered 2016-07-21: 180 mg via ORAL
  Filled 2016-07-21: qty 2

## 2016-07-21 MED ORDER — ASPIRIN 81 MG PO CHEW
81.0000 mg | CHEWABLE_TABLET | Freq: Every day | ORAL | Status: DC
  Administered 2016-07-23 – 2016-07-27 (×4): 81 mg via ORAL
  Filled 2016-07-21 (×6): qty 1

## 2016-07-21 MED ORDER — TICAGRELOR 90 MG PO TABS
90.0000 mg | ORAL_TABLET | Freq: Two times a day (BID) | ORAL | Status: DC
Start: 2016-07-22 — End: 2016-07-29
  Administered 2016-07-22 – 2016-07-29 (×14): 90 mg via ORAL
  Filled 2016-07-21 (×14): qty 1

## 2016-07-21 MED ORDER — CETYLPYRIDINIUM CHLORIDE 0.05 % MT LIQD
7.0000 mL | Freq: Two times a day (BID) | OROMUCOSAL | Status: DC
  Administered 2016-07-21 – 2016-08-01 (×21): 7 mL via OROMUCOSAL

## 2016-07-21 NOTE — Progress Notes (Signed)
Patient ID: Joshua [name], male   DOB: [birth year], 60 y.o.   MRN: [id number]      [address]       [place] [address]             [phone]                 2 Days Post-Op Procedure(s) (LRB): Left Heart Cath and Coronary Angiography (N/A)  LOS: 2 days   Subjective: No chest pain   Objective: Vital signs in last 24 hours: Patient Vitals for the past 24 hrs:  BP Temp Temp src Pulse Resp SpO2  07/21/16 1240 122/65 99.8 F (37.7 C) Oral 91 (!) 21 98 %  07/21/16 1055 127/74 - - 100 - -  07/21/16 0518 (!) 161/83 99.4 F (37.4 C) Oral 96 20 97 %  07/20/16 2012 126/61 99.4 F (37.4 C) Oral 96 (!) 21 97 %    Filed Weights   07/18/16 1620 07/18/16 2305  Weight: (!) 317 lb (143.8 kg) (!) 317 lb 7.4 oz (144 kg)    Hemodynamic parameters for last 24 hours:    Intake/Output from previous day: 08/08 0701 - 08/09 0700 In: 960 [P.O.:960] Out: 1520 [Urine:1520] Intake/Output this shift: Total I/O In: 723 [P.O.:720; I.V.:3] Out: 500 [Urine:500]  Scheduled Meds: . allopurinol  300 mg Oral Daily  . amLODipine  10 mg Oral Daily  . aspirin  81 mg Oral Daily  . doxazosin  8 mg Oral Daily  . furosemide  40 mg Oral Daily  . hydrALAZINE  37.5 mg Oral BID  . insulin aspart  0-20 Units Subcutaneous TID WC  . insulin NPH Human  20 Units Subcutaneous QHS   And  . insulin NPH Human  20 Units Subcutaneous QAC breakfast  . isosorbide mononitrate  60 mg Oral Daily  . metoprolol succinate  50 mg Oral Daily  . pantoprazole  40 mg Oral Daily  . rosuvastatin  40 mg Oral Daily  . sodium chloride flush  3 mL Intravenous Q12H  . ticagrelor  180 mg Oral Once  . [START ON 07/22/2016] ticagrelor  90 mg Oral BID   Continuous Infusions: . sodium chloride    . heparin 1,850 Units/hr (07/21/16 0539)  . nitroGLYCERIN 15 mcg/min (07/20/16 2036)   PRN Meds:.sodium chloride, acetaminophen, nitroGLYCERIN, ondansetron (ZOFRAN) IV, sodium chloride flush  General appearance: alert  and cooperative Neurologic: intact Heart: regular rate and rhythm, S1, S2 normal, no murmur, click, rub or gallop Lungs: clear to auscultation bilaterally Abdomen: soft, non-tender; bowel sounds normal; no masses,  no organomegaly Extremities: edema both legs and feet right > left , non healing ulce rt great toe    Lab Results: CBC: Recent Labs  07/19/16 0508 07/21/16 0318  WBC 8.2 10.0  HGB 11.4* 11.0*  HCT 36.4* 35.3*  PLT 148* 142*   BMET:  Recent Labs  07/20/16 0410 07/21/16 1148  NA 139 134*  K 3.6 3.7  CL 108 105  CO2 23 21*  GLUCOSE 107* 152*  BUN 18 17  CREATININE 1.62* 1.61*  CALCIUM 8.5* 8.9    PT/INR:  Recent Labs  07/19/16 1105  LABPROT 13.2  INR 1.00     Radiology No results found. Chronic Kidney Disease   Stage I     GFR >90  Stage II    GFR 60-89  Stage IIIA GFR 45-59  Stage IIIB GFR 30-44  Stage IV   GFR 15-29  Stage V  GFR  <15  Lab Results  Component Value Date   CREATININE 1.61 (H) 07/21/2016   Estimated Creatinine Clearance: 71.6 mL/min (by C-G formula based on SCr of 1.61 mg/dL).   Assessment/Plan: S/P Procedure(s) (LRB): Left Heart Cath and Coronary Angiography (N/A) Renal function stable  After extension discussion of options with patient and review cath films patient prefers to proceed with angioplasty , after he will need more aggressive control of DM, lipids and risk factors. He understands not taking meds is not  option ie  brilinta  Dr Tamala Julian has also discussed with patient   Grace Isaac MD 07/21/2016 5:33 PM

## 2016-07-21 NOTE — Progress Notes (Signed)
ANTICOAGULATION CONSULT NOTE - Follow Up Consult  Pharmacy Consult for Heparin  Indication: 3V CAD  Not on File  Patient Measurements: Height: 5\' 9"  (175.3 cm) Weight: (!) 317 lb 7.4 oz (144 kg) IBW/kg (Calculated) : 70.7  Heparin dosing et: 105kg  Vital Signs: Temp: 99.8 F (37.7 C) (08/09 1240) Temp Source: Oral (08/09 1240) BP: 122/65 (08/09 1240) Pulse Rate: 91 (08/09 1240)  Labs:  Recent Labs  07/18/16 1612 07/18/16 2357 07/19/16 0508  07/19/16 1105 07/19/16 1253 07/20/16 0410  07/20/16 1902 07/21/16 0318 07/21/16 1148  HGB 13.0  --  11.4*  --   --   --   --   --   --  11.0*  --   HCT 40.8  --  36.4*  --   --   --   --   --   --  35.3*  --   PLT 166  --  148*  --   --   --   --   --   --  142*  --   LABPROT  --   --   --   --  13.2  --   --   --   --   --   --   INR  --   --   --   --  1.00  --   --   --   --   --   --   HEPARINUNFRC  --  0.31  --   < >  --   --   --   < > 0.37 0.29* 0.40  CREATININE 1.34*  --  1.45*  --   --   --  1.62*  --   --   --  1.61*  TROPONINI  --  0.24* 0.18*  --   --  0.16*  --   --   --   --   --   < > = values in this interval not displayed.  Estimated Creatinine Clearance: 71.6 mL/min (by C-G formula based on SCr of 1.61 mg/dL).  Assessment: 60 yo male s/p cath which showed 3V CAD. Heparin re-started post cath and plans for staged intervention vs CABG.  -heparin level = 0.4 on 1850 units/hr  Goal of Therapy:  Heparin level 0.3-0.7 units/ml Monitor platelets by anticoagulation protocol: Yes   Plan:  -No heparin changes needed -Heparin level and CBC daily  Hildred Laser, Pharm D 07/21/2016 1:16 PM

## 2016-07-21 NOTE — Progress Notes (Signed)
ANTICOAGULATION CONSULT NOTE - Follow Up Consult  Pharmacy Consult for Heparin  Indication: 3V CAD  Not on File  Patient Measurements: Height: 5\' 9"  (175.3 cm) Weight: (!) 317 lb 7.4 oz (144 kg) IBW/kg (Calculated) : 70.7  Heparin dosing et: 105kg  Vital Signs: Temp: 99.4 F (37.4 C) (08/08 2012) Temp Source: Oral (08/08 2012) BP: 126/61 (08/08 2012) Pulse Rate: 96 (08/08 2012)  Labs:  Recent Labs  07/18/16 1612 07/18/16 2357 07/19/16 0508  07/19/16 1105 07/19/16 1253 07/20/16 0410 07/20/16 1058 07/20/16 1902 07/21/16 0318  HGB 13.0  --  11.4*  --   --   --   --   --   --  11.0*  HCT 40.8  --  36.4*  --   --   --   --   --   --  35.3*  PLT 166  --  148*  --   --   --   --   --   --  142*  LABPROT  --   --   --   --  13.2  --   --   --   --   --   INR  --   --   --   --  1.00  --   --   --   --   --   HEPARINUNFRC  --  0.31  --   < >  --   --   --  0.18* 0.37 0.29*  CREATININE 1.34*  --  1.45*  --   --   --  1.62*  --   --   --   TROPONINI  --  0.24* 0.18*  --   --  0.16*  --   --   --   --   < > = values in this interval not displayed.  Estimated Creatinine Clearance: 71.2 mL/min (by C-G formula based on SCr of 1.62 mg/dL).  Assessment: 60 yo male s/p cath which showed 3V CAD. Heparin restarted post cath and plans for staged intervention vs CABG. Heparin level now down to slightly subtherapeutic on 1650 units/hr. No issues with line or bleeding reported per RN. CBC stable.  Goal of Therapy:  Heparin level 0.3-0.7 units/ml Monitor platelets by anticoagulation protocol: Yes   Plan:  -Increase heparin to 1850 units/hr -Will f/u 6h heparin level  Sherlon Handing, PharmD, BCPS Clinical pharmacist, pager [phone] 07/21/2016, 4:56 AM

## 2016-07-21 NOTE — Progress Notes (Addendum)
Interventional Cardiology Note   The patient has decided against bypass surgery at this time.  Discussed PCI option with Dr. Servando Snare and the patient.  Recheck creatinine in a.m. Tentatively plan LAD proximal to ostial stent and obtuse marginal stent tomorrow assuming kidney function remained stable. If creatinine continues upward, may need to wait another day or 2 before proceeding.  We'll continue slightly more aggressive hydration starting tonight at 8 PM

## 2016-07-21 NOTE — Clinical Social Work Note (Signed)
CSW received inappropriate consult. CSW informed RNCM. CSW signing off.  [employer], LCSW [phone]

## 2016-07-22 ENCOUNTER — Encounter (HOSPITAL_COMMUNITY): Payer: Self-pay

## 2016-07-22 LAB — CBC
HCT: 35.1 % — ABNORMAL LOW (ref 39.0–52.0)
Hemoglobin: 11.1 g/dL — ABNORMAL LOW (ref 13.0–17.0)
MCH: 27.8 pg (ref 26.0–34.0)
MCHC: 31.6 g/dL (ref 30.0–36.0)
MCV: 88 fL (ref 78.0–100.0)
Platelets: 150 10*3/uL (ref 150–400)
RBC: 3.99 MIL/uL — ABNORMAL LOW (ref 4.22–5.81)
RDW: 14.5 % (ref 11.5–15.5)
WBC: 10.8 10*3/uL — ABNORMAL HIGH (ref 4.0–10.5)

## 2016-07-22 LAB — URINE MICROSCOPIC-ADD ON

## 2016-07-22 LAB — URINALYSIS, ROUTINE W REFLEX MICROSCOPIC
Bilirubin Urine: NEGATIVE
Glucose, UA: NEGATIVE mg/dL
Ketones, ur: NEGATIVE mg/dL
Leukocytes, UA: NEGATIVE
Nitrite: NEGATIVE
Protein, ur: >300 mg/dL — AB
Specific Gravity, Urine: 1.021 (ref 1.005–1.030)
pH: 5 (ref 5.0–8.0)

## 2016-07-22 LAB — BASIC METABOLIC PANEL
Anion gap: 9 (ref 5–15)
BUN: 19 mg/dL (ref 6–20)
CO2: 22 mmol/L (ref 22–32)
Calcium: 8.6 mg/dL — ABNORMAL LOW (ref 8.9–10.3)
Chloride: 105 mmol/L (ref 101–111)
Creatinine, Ser: 1.52 mg/dL — ABNORMAL HIGH (ref 0.61–1.24)
GFR calc Af Amer: 57 mL/min — ABNORMAL LOW (ref >60)
GFR calc non Af Amer: 49 mL/min — ABNORMAL LOW (ref >60)
Glucose, Bld: 156 mg/dL — ABNORMAL HIGH (ref 65–99)
Potassium: 3.6 mmol/L (ref 3.5–5.1)
Sodium: 136 mmol/L (ref 135–145)

## 2016-07-22 LAB — GLUCOSE, CAPILLARY
Glucose-Capillary: 170 mg/dL — ABNORMAL HIGH (ref 65–99)
Glucose-Capillary: 178 mg/dL — ABNORMAL HIGH (ref 65–99)
Glucose-Capillary: 166 mg/dL — ABNORMAL HIGH (ref 65–99)
Glucose-Capillary: 177 mg/dL — ABNORMAL HIGH (ref 65–99)

## 2016-07-22 LAB — HEPARIN LEVEL (UNFRACTIONATED): Heparin Unfractionated: 0.36 IU/mL (ref 0.30–0.70)

## 2016-07-22 NOTE — Progress Notes (Signed)
    Seen by myself and Dr. Tamala Julian. He has a , "a large amt of blood spurted out on a place on top of my right great toe."  This is a new development and does not affect the plantar foot chronic wound.  Anterior great toe with pinhole opening; .1X.1X2cm when probem with swab, mod amt bleeding, no odor, fluctuant towards the bottom of his foot. Seen by wound care nurse. I will order a MR of right foot to rule out abcess or osteo. If that is okay, we will plan for PCI with Dr. Claiborne Billings tomorrow.    Angelena Form PA-C  [facility]

## 2016-07-22 NOTE — Progress Notes (Signed)
ANTICOAGULATION CONSULT NOTE - Follow Up Consult  Pharmacy Consult for Heparin  Indication: 3V CAD  Not on File  Patient Measurements: Height: 5\' 9"  (175.3 cm) Weight: (!) 319 lb 1.6 oz (144.7 kg) IBW/kg (Calculated) : 70.7  Heparin dosing et: 105kg  Vital Signs: Temp: 99.1 F (37.3 C) (08/10 0347) Temp Source: Oral (08/10 0347) BP: 153/80 (08/10 0347) Pulse Rate: 95 (08/10 0347)  Labs:  Recent Labs  07/19/16 1105 07/19/16 1253 07/20/16 0410  07/21/16 0318 07/21/16 1148 07/22/16 0445  HGB  --   --   --   --  11.0*  --  11.1*  HCT  --   --   --   --  35.3*  --  35.1*  PLT  --   --   --   --  142*  --  150  LABPROT 13.2  --   --   --   --   --   --   INR 1.00  --   --   --   --   --   --   HEPARINUNFRC  --   --   --   < > 0.29* 0.40 0.36  CREATININE  --   --  1.62*  --   --  1.61* 1.52*  TROPONINI  --  0.16*  --   --   --   --   --   < > = values in this interval not displayed.  Estimated Creatinine Clearance: 76.1 mL/min (by C-G formula based on SCr of 1.52 mg/dL).  Assessment: 60 yo male s/p cath which showed 3V CAD. Heparin re-started post cath and plans for PCI today  -heparin level = 0.36 on 1850 units/hr  Goal of Therapy:  Heparin level 0.3-0.7 units/ml Monitor platelets by anticoagulation protocol: Yes   Plan:  -No heparin changes needed -Heparin level and CBC daily -Will follow plans post cath  Hildred Laser, Pharm D 07/22/2016 8:41 AM

## 2016-07-22 NOTE — Progress Notes (Signed)
Subjective:  No CP/SOB For LAD/ LCX -OM PCI/DES today  Objective:  Temp:  [99.1 F (37.3 C)-102.2 F (39 C)] 99.1 F (37.3 C) (08/10 0347) Pulse Rate:  [91-101] 95 (08/10 0347) Resp:  [19-21] 19 (08/10 0347) BP: (122-153)/(65-80) 153/80 (08/10 0347) SpO2:  [94 %-98 %] 94 % (08/10 0347) Weight:  [319 lb 1.6 oz (144.7 kg)] 319 lb 1.6 oz (144.7 kg) (08/10 0553) Weight change:   Intake/Output from previous day: 08/09 0701 - 08/10 0700 In: 1135 [P.O.:960; I.V.:175] Out: 1225 [Urine:1225]  Intake/Output from this shift: No intake/output data recorded.  Physical Exam: General appearance: alert and no distress Neck: no adenopathy, no carotid bruit, no JVD, supple, symmetrical, trachea midline and thyroid not enlarged, symmetric, no tenderness/mass/nodules Lungs: clear to auscultation bilaterally Heart: regular rate and rhythm, S1, S2 normal, no murmur, click, rub or gallop Extremities: extremities normal, atraumatic, no cyanosis or edema  Lab Results: Results for orders placed or performed during the hospital encounter of 07/18/16 (from the past 48 hour(s))  Heparin level (unfractionated)     Status: Abnormal   Collection Time: 07/20/16 10:58 AM  Result Value Ref Range   Heparin Unfractionated 0.18 (L) 0.30 - 0.70 IU/mL    Comment:        IF HEPARIN RESULTS ARE BELOW EXPECTED VALUES, AND PATIENT DOSAGE HAS BEEN CONFIRMED, SUGGEST FOLLOW UP TESTING OF ANTITHROMBIN III LEVELS.   Glucose, capillary     Status: Abnormal   Collection Time: 07/20/16 11:29 AM  Result Value Ref Range   Glucose-Capillary 169 (H) 65 - 99 mg/dL   Comment 1 Notify RN    Comment 2 Document in Chart   Glucose, capillary     Status: Abnormal   Collection Time: 07/20/16  5:53 PM  Result Value Ref Range   Glucose-Capillary 131 (H) 65 - 99 mg/dL   Comment 1 Glucose Stabilizer   Heparin level (unfractionated)     Status: None   Collection Time: 07/20/16  7:02 PM  Result Value Ref Range   Heparin Unfractionated 0.37 0.30 - 0.70 IU/mL    Comment:        IF HEPARIN RESULTS ARE BELOW EXPECTED VALUES, AND PATIENT DOSAGE HAS BEEN CONFIRMED, SUGGEST FOLLOW UP TESTING OF ANTITHROMBIN III LEVELS.   Glucose, capillary     Status: Abnormal   Collection Time: 07/20/16  9:24 PM  Result Value Ref Range   Glucose-Capillary 135 (H) 65 - 99 mg/dL  Heparin level (unfractionated)     Status: Abnormal   Collection Time: 07/21/16  3:18 AM  Result Value Ref Range   Heparin Unfractionated 0.29 (L) 0.30 - 0.70 IU/mL    Comment:        IF HEPARIN RESULTS ARE BELOW EXPECTED VALUES, AND PATIENT DOSAGE HAS BEEN CONFIRMED, SUGGEST FOLLOW UP TESTING OF ANTITHROMBIN III LEVELS.   CBC     Status: Abnormal   Collection Time: 07/21/16  3:18 AM  Result Value Ref Range   WBC 10.0 4.0 - 10.5 K/uL   RBC 3.98 (L) 4.22 - 5.81 MIL/uL   Hemoglobin 11.0 (L) 13.0 - 17.0 g/dL   HCT 35.3 (L) 39.0 - 52.0 %   MCV 88.7 78.0 - 100.0 fL   MCH 27.6 26.0 - 34.0 pg   MCHC 31.2 30.0 - 36.0 g/dL   RDW 14.6 11.5 - 15.5 %   Platelets 142 (L) 150 - 400 K/uL  Glucose, capillary     Status: None   Collection Time: 07/21/16  6:08 AM  Result Value Ref Range   Glucose-Capillary 77 65 - 99 mg/dL  Glucose, capillary     Status: Abnormal   Collection Time: 07/21/16 11:33 AM  Result Value Ref Range   Glucose-Capillary 147 (H) 65 - 99 mg/dL  Basic metabolic panel     Status: Abnormal   Collection Time: 07/21/16 11:48 AM  Result Value Ref Range   Sodium 134 (L) 135 - 145 mmol/L   Potassium 3.7 3.5 - 5.1 mmol/L   Chloride 105 101 - 111 mmol/L   CO2 21 (L) 22 - 32 mmol/L   Glucose, Bld 152 (H) 65 - 99 mg/dL   BUN 17 6 - 20 mg/dL   Creatinine, Ser 1.61 (H) 0.61 - 1.24 mg/dL   Calcium 8.9 8.9 - 10.3 mg/dL   GFR calc non Af Amer 46 (L) >60 mL/min   GFR calc Af Amer 53 (L) >60 mL/min    Comment: (NOTE) The eGFR has been calculated using the CKD EPI equation. This calculation has not been validated in all clinical  situations. eGFR's persistently <60 mL/min signify possible Chronic Kidney Disease.    Anion gap 8 5 - 15  Heparin level (unfractionated)     Status: None   Collection Time: 07/21/16 11:48 AM  Result Value Ref Range   Heparin Unfractionated 0.40 0.30 - 0.70 IU/mL    Comment:        IF HEPARIN RESULTS ARE BELOW EXPECTED VALUES, AND PATIENT DOSAGE HAS BEEN CONFIRMED, SUGGEST FOLLOW UP TESTING OF ANTITHROMBIN III LEVELS.   Glucose, capillary     Status: Abnormal   Collection Time: 07/21/16  4:31 PM  Result Value Ref Range   Glucose-Capillary 222 (H) 65 - 99 mg/dL   Comment 1 Notify RN    Comment 2 Document in Chart   Glucose, capillary     Status: Abnormal   Collection Time: 07/21/16  9:17 PM  Result Value Ref Range   Glucose-Capillary 165 (H) 65 - 99 mg/dL   Comment 1 Notify RN   Urinalysis, Routine w reflex microscopic (not at [facility])     Status: Abnormal   Collection Time: 07/22/16 12:33 AM  Result Value Ref Range   Color, Urine YELLOW YELLOW   APPearance CLOUDY (A) CLEAR   Specific Gravity, Urine 1.021 1.005 - 1.030   pH 5.0 5.0 - 8.0   Glucose, UA NEGATIVE NEGATIVE mg/dL   Hgb urine dipstick SMALL (A) NEGATIVE   Bilirubin Urine NEGATIVE NEGATIVE   Ketones, ur NEGATIVE NEGATIVE mg/dL   Protein, ur >300 (A) NEGATIVE mg/dL   Nitrite NEGATIVE NEGATIVE   Leukocytes, UA NEGATIVE NEGATIVE  Urine microscopic-add on     Status: Abnormal   Collection Time: 07/22/16 12:33 AM  Result Value Ref Range   Squamous Epithelial / LPF 0-5 (A) NONE SEEN   WBC, UA 0-5 0 - 5 WBC/hpf   RBC / HPF 6-30 0 - 5 RBC/hpf   Bacteria, UA FEW (A) NONE SEEN   Casts HYALINE CASTS (A) NEGATIVE    Comment: GRANULAR CAST   Urine-Other MUCOUS PRESENT   Heparin level (unfractionated)     Status: None   Collection Time: 07/22/16  4:45 AM  Result Value Ref Range   Heparin Unfractionated 0.36 0.30 - 0.70 IU/mL    Comment:        IF HEPARIN RESULTS ARE BELOW EXPECTED VALUES, AND PATIENT DOSAGE HAS BEEN  CONFIRMED, SUGGEST FOLLOW UP TESTING OF ANTITHROMBIN III LEVELS.   CBC  Status: Abnormal   Collection Time: 07/22/16  4:45 AM  Result Value Ref Range   WBC 10.8 (H) 4.0 - 10.5 K/uL   RBC 3.99 (L) 4.22 - 5.81 MIL/uL   Hemoglobin 11.1 (L) 13.0 - 17.0 g/dL   HCT 35.1 (L) 39.0 - 52.0 %   MCV 88.0 78.0 - 100.0 fL   MCH 27.8 26.0 - 34.0 pg   MCHC 31.6 30.0 - 36.0 g/dL   RDW 14.5 11.5 - 15.5 %   Platelets 150 150 - 400 K/uL  Basic metabolic panel     Status: Abnormal   Collection Time: 07/22/16  4:45 AM  Result Value Ref Range   Sodium 136 135 - 145 mmol/L   Potassium 3.6 3.5 - 5.1 mmol/L   Chloride 105 101 - 111 mmol/L   CO2 22 22 - 32 mmol/L   Glucose, Bld 156 (H) 65 - 99 mg/dL   BUN 19 6 - 20 mg/dL   Creatinine, Ser 1.52 (H) 0.61 - 1.24 mg/dL   Calcium 8.6 (L) 8.9 - 10.3 mg/dL   GFR calc non Af Amer 49 (L) >60 mL/min   GFR calc Af Amer 57 (L) >60 mL/min    Comment: (NOTE) The eGFR has been calculated using the CKD EPI equation. This calculation has not been validated in all clinical situations. eGFR's persistently <60 mL/min signify possible Chronic Kidney Disease.    Anion gap 9 5 - 15  Glucose, capillary     Status: Abnormal   Collection Time: 07/22/16  6:08 AM  Result Value Ref Range   Glucose-Capillary 170 (H) 65 - 99 mg/dL   Comment 1 Notify RN     Imaging: Imaging results have been reviewed  Tele- NSR  Assessment/Plan:   1. Principal Problem: 2.   Unstable angina pectoris ([place]) 3. Active Problems: 4.   CAD in native artery 5.   Chronic combined systolic and diastolic CHF (congestive heart failure) ([place]) 6.   Coronary artery disease 7.   Hyperlipemia 8.   Renal insufficiency 9.   Time Spent Directly with Patient:  15 minutes  Length of Stay:  LOS: 3 days   Decision was made by Pt and Dr Servando Snare to proceed with LAD/ LCX-OM PCI/DES today. On IV hep/NTG. No CP. SCr slightly improved.   Quay Burow 07/22/2016, 9:06 AM

## 2016-07-22 NOTE — Progress Notes (Signed)
Patient stated that he "had a large amount of blood spurting out" of greater righter toe. The foot has been cleaned and wrapped with Kerlex. The PA has been notified and a consult to the wound nurse has been ordered. Will continue to monitor.

## 2016-07-22 NOTE — Consult Note (Addendum)
WOC re-consult requested for right foot.  Refer to previous progress notes on 8/7 for assessment and plan of care for plantar foot chronic wound.  This am, patient was standing and states, "a large amt of blood spurted out on a place on top of my right great toe."  This is a new development and does not affect the plantar foot chronic wound.  Anterior great toe with pinhole opening; .1X.1X2cm when probed with swab, mod amt bleeding, no odor, fluctuant towards the bottom of his foot.  Cardiac team PA called to assess wound and discuss plan of care.  Pt has a fever and could benefit from an MRI to R/O osteomyelitis.  He is on a high dose of heparin and is at risk for re-bleeding to occur after it was stopped this time by holding pressure.  PA at bedside to discuss the plan of care.  Plan: Apply double folded gauze, then kerlex and ace wrap for a pressure dressing to RLE. Change Q day or PRN bleeding. CALL CARDIAC PA if bleeding re-occurs for further orders. Please re-consult if further assistance is needed.  Thank-you,  Julien Girt MSN, [place], [place], [place], [place]

## 2016-07-23 ENCOUNTER — Encounter (HOSPITAL_COMMUNITY)
Admission: EM | Disposition: A | Discharge status: Rehabilitation Facility | Payer: Self-pay | Source: Home / Self Care | Attending: Cardiovascular Disease

## 2016-07-23 ENCOUNTER — Inpatient Hospital Stay (HOSPITAL_COMMUNITY): Payer: BLUE CROSS/BLUE SHIELD

## 2016-07-23 DIAGNOSIS — N183 Chronic kidney disease, stage 3 unspecified: Secondary | ICD-10-CM

## 2016-07-23 DIAGNOSIS — N189 Chronic kidney disease, unspecified: Secondary | ICD-10-CM

## 2016-07-23 DIAGNOSIS — E1122 Type 2 diabetes mellitus with diabetic chronic kidney disease: Secondary | ICD-10-CM

## 2016-07-23 DIAGNOSIS — B9689 Other specified bacterial agents as the cause of diseases classified elsewhere: Secondary | ICD-10-CM

## 2016-07-23 DIAGNOSIS — S91331A Puncture wound without foreign body, right foot, initial encounter: Secondary | ICD-10-CM

## 2016-07-23 DIAGNOSIS — L02611 Cutaneous abscess of right foot: Secondary | ICD-10-CM

## 2016-07-23 DIAGNOSIS — L97519 Non-pressure chronic ulcer of other part of right foot with unspecified severity: Secondary | ICD-10-CM

## 2016-07-23 DIAGNOSIS — Z89421 Acquired absence of other right toe(s): Secondary | ICD-10-CM

## 2016-07-23 DIAGNOSIS — E1169 Type 2 diabetes mellitus with other specified complication: Secondary | ICD-10-CM

## 2016-07-23 DIAGNOSIS — N1832 Chronic kidney disease, stage 3b: Secondary | ICD-10-CM

## 2016-07-23 DIAGNOSIS — M86171 Other acute osteomyelitis, right ankle and foot: Secondary | ICD-10-CM

## 2016-07-23 DIAGNOSIS — S81802A Unspecified open wound, left lower leg, initial encounter: Secondary | ICD-10-CM

## 2016-07-23 DIAGNOSIS — E1140 Type 2 diabetes mellitus with diabetic neuropathy, unspecified: Secondary | ICD-10-CM

## 2016-07-23 DIAGNOSIS — E11621 Type 2 diabetes mellitus with foot ulcer: Secondary | ICD-10-CM

## 2016-07-23 LAB — HEPARIN LEVEL (UNFRACTIONATED)
Heparin Unfractionated: 0.1 IU/mL — ABNORMAL LOW (ref 0.30–0.70)
Heparin Unfractionated: 0.28 IU/mL — ABNORMAL LOW (ref 0.30–0.70)
Heparin Unfractionated: 0.27 IU/mL — ABNORMAL LOW (ref 0.30–0.70)

## 2016-07-23 LAB — GLUCOSE, CAPILLARY
Glucose-Capillary: 185 mg/dL — ABNORMAL HIGH (ref 65–99)
Glucose-Capillary: 176 mg/dL — ABNORMAL HIGH (ref 65–99)
Glucose-Capillary: 158 mg/dL — ABNORMAL HIGH (ref 65–99)
Glucose-Capillary: 160 mg/dL — ABNORMAL HIGH (ref 65–99)
Glucose-Capillary: 151 mg/dL — ABNORMAL HIGH (ref 65–99)

## 2016-07-23 LAB — BASIC METABOLIC PANEL
Anion gap: 12 (ref 5–15)
BUN: 17 mg/dL (ref 6–20)
CO2: 20 mmol/L — ABNORMAL LOW (ref 22–32)
Calcium: 8.6 mg/dL — ABNORMAL LOW (ref 8.9–10.3)
Chloride: 104 mmol/L (ref 101–111)
Creatinine, Ser: 1.51 mg/dL — ABNORMAL HIGH (ref 0.61–1.24)
GFR calc Af Amer: 57 mL/min — ABNORMAL LOW (ref >60)
GFR calc non Af Amer: 50 mL/min — ABNORMAL LOW (ref >60)
Glucose, Bld: 175 mg/dL — ABNORMAL HIGH (ref 65–99)
Potassium: 3.7 mmol/L (ref 3.5–5.1)
Sodium: 136 mmol/L (ref 135–145)

## 2016-07-23 LAB — CBC
HCT: 33.9 % — ABNORMAL LOW (ref 39.0–52.0)
Hemoglobin: 10.8 g/dL — ABNORMAL LOW (ref 13.0–17.0)
MCH: 27.6 pg (ref 26.0–34.0)
MCHC: 31.9 g/dL (ref 30.0–36.0)
MCV: 86.7 fL (ref 78.0–100.0)
Platelets: 183 10*3/uL (ref 150–400)
RBC: 3.91 MIL/uL — ABNORMAL LOW (ref 4.22–5.81)
RDW: 14.6 % (ref 11.5–15.5)
WBC: 10.3 10*3/uL (ref 4.0–10.5)

## 2016-07-23 LAB — C-REACTIVE PROTEIN: CRP: 28.9 mg/dL — ABNORMAL HIGH (ref <1.0)

## 2016-07-23 LAB — SURGICAL PCR SCREEN
MRSA, PCR: NEGATIVE
Staphylococcus aureus: NEGATIVE

## 2016-07-23 LAB — URINE CULTURE: Culture: NO GROWTH

## 2016-07-23 SURGERY — CORONARY STENT INTERVENTION
Anesthesia: LOCAL

## 2016-07-23 MED ORDER — POVIDONE-IODINE 10 % EX SWAB: 2.0000 "application " | Freq: Once | CUTANEOUS | Status: DC

## 2016-07-23 MED ORDER — SODIUM CHLORIDE 0.45 % IV SOLN
INTRAVENOUS | Status: DC
  Administered 2016-07-23: 20:00:00 via INTRAVENOUS

## 2016-07-23 MED ORDER — DEXTROSE 5 % IV SOLN
3.0000 g | INTRAVENOUS | Status: DC
  Filled 2016-07-23: qty 3000

## 2016-07-23 NOTE — Progress Notes (Addendum)
Patient Name: Joshua [name] Date of Encounter: 07/23/2016   SUBJECTIVE  Pending MR of right foot prior to cath. Accidentally pulled out R wrist IV site yesterday. Stable SOB. No chest pain.   CURRENT MEDS . allopurinol  300 mg Oral Daily  . amLODipine  10 mg Oral Daily  . antiseptic oral rinse  7 mL Mouth Rinse BID  . aspirin  81 mg Oral Daily  . doxazosin  8 mg Oral Daily  . furosemide  40 mg Oral Daily  . hydrALAZINE  37.5 mg Oral BID  . insulin aspart  0-20 Units Subcutaneous TID WC  . insulin NPH Human  20 Units Subcutaneous QHS   And  . insulin NPH Human  20 Units Subcutaneous QAC breakfast  . isosorbide mononitrate  60 mg Oral Daily  . metoprolol succinate  50 mg Oral Daily  . pantoprazole  40 mg Oral Daily  . rosuvastatin  40 mg Oral Daily  . sodium chloride flush  3 mL Intravenous Q12H  . sodium chloride flush  3 mL Intravenous Q12H  . ticagrelor  90 mg Oral BID    OBJECTIVE  Vitals:   07/22/16 1332 07/22/16 2002 07/22/16 2201 07/23/16 0442  BP: 135/73 (!) 153/73 129/69 (!) 161/87  Pulse: 91 (!) 108 97 99  Resp: $Remo'19 20  18  'zlaNL$ Temp: 99.1 F (37.3 C) (!) 100.5 F (38.1 C)  99.3 F (37.4 C)  TempSrc: Oral Oral  Oral  SpO2: 92% 94%  98%  Weight:      Height:        Intake/Output Summary (Last 24 hours) at 07/23/16 0957 Last data filed at 07/23/16 0828  Gross per 24 hour  Intake                0 ml  Output             1375 ml  Net            -1375 ml   Filed Weights   07/18/16 1620 07/18/16 2305 07/22/16 0553  Weight: (!) 317 lb (143.8 kg) (!) 317 lb 7.4 oz (144 kg) (!) 319 lb 1.6 oz (144.7 kg)    PHYSICAL EXAM  General: Pleasant, NAD. Neuro: Alert and oriented X 3. Moves all extremities spontaneously. Psych: Normal affect. HEENT:  Normal  Neck: Supple without bruits or JVD. Lungs:  Resp regular and unlabored, CTA. Heart: RRR no s3, s4, or murmurs. Abdomen: Soft, non-tender, non-distended, BS + x 4.  Extremities: No clubbing, cyanosis. 1+ BL  LE  edema. DP/PT/Radials 2+ and equal bilaterally. R foot ACE warped.   Accessory Clinical Findings  CBC  Recent Labs  07/22/16 0445 07/23/16 0249  WBC 10.8* 10.3  HGB 11.1* 10.8*  HCT 35.1* 33.9*  MCV 88.0 86.7  PLT 150 355   Basic Metabolic Panel  Recent Labs  07/22/16 0445 07/23/16 0445  NA 136 136  K 3.6 3.7  CL 105 104  CO2 22 20*  GLUCOSE 156* 175*  BUN 19 17  CREATININE 1.52* 1.51*  CALCIUM 8.6* 8.6*   Liver Function Tests No results for input(s): AST, ALT, ALKPHOS, BILITOT, PROT, ALBUMIN in the last 72 hours. No results for input(s): LIPASE, AMYLASE in the last 72 hours. Cardiac Enzymes No results for input(s): CKTOTAL, CKMB, CKMBINDEX, TROPONINI in the last 72 hours. BNP Invalid input(s): POCBNP D-Dimer No results for input(s): DDIMER in the last 72 hours. Hemoglobin A1C No results for input(s): HGBA1C in the last  72 hours. Fasting Lipid Panel No results for input(s): CHOL, HDL, LDLCALC, TRIG, CHOLHDL, LDLDIRECT in the last 72 hours. Thyroid Function Tests No results for input(s): TSH, T4TOTAL, T3FREE, THYROIDAB in the last 72 hours.  Invalid input(s): FREET3  TELE  Sinus rhythm at rate of 90s-100s. PVCs  Radiology/Studies  Dg Chest 2 View  Result Date: 07/18/2016 CLINICAL DATA:  Left-sided chest pain for 2 weeks, initial encounter EXAM: CHEST  2 VIEW COMPARISON:  06/23/2016 FINDINGS: Cardiac shadow is at the upper limits of normal in size. Multiple calcified granulomas are noted consistent with prior granulomatous disease. No focal confluent infiltrate is seen. Mild bibasilar scarring is again noted. Degenerative change of the thoracic spine is seen. IMPRESSION: No acute abnormality noted. Electronically Signed   By: Inez Catalina M.D.   On: 07/18/2016 16:59   Dg Chest 2 View  Result Date: 06/23/2016 CLINICAL DATA:  Short of breath EXAM: CHEST  2 VIEW COMPARISON:  07/29/2015 FINDINGS: COPD with pulmonary hyperinflation.  Bibasilar scarring  unchanged Negative for pneumonia. Negative for heart failure or effusion. No mass lesion. IMPRESSION: COPD.  No acute abnormality. Electronically Signed   By: Franchot Gallo M.D.   On: 06/23/2016 14:32   Ct Angio Chest Pe W And/or Wo Contrast  Result Date: 07/18/2016 CLINICAL DATA:  Chest discomfort for several days, worse last night and today. Burning and tightness. EXAM: CT ANGIOGRAPHY CHEST WITH CONTRAST TECHNIQUE: Multidetector CT imaging of the chest was performed using the standard protocol during bolus administration of intravenous contrast. Multiplanar CT image reconstructions and MIPs were obtained to evaluate the vascular anatomy. CONTRAST:  100 cc Isovue 370. COMPARISON:  Plain films of the chest today and 10/28/2013. FINDINGS: Evaluation for pulmonary embolus is limited by bolus timing. The study was not repeated as the patient has renal insufficiency and mildly decreased GFR. No central or proximal segmental pulmonary embolus is identified. There is cardiomegaly. Extensive calcific coronary artery disease is seen. Multiple calcified mediastinal and hilar lymph nodes are noted. No axillary lymphadenopathy. No consolidative process is identified. There is some reticulation of lung parenchyma best seen in the right middle lobe and left lung base. No nodule is identified. Visualized upper abdomen demonstrates no focal abnormality. No lytic or sclerotic bony lesions identified. Review of the MIP images confirms the above findings. IMPRESSION: Although the study is somewhat limited by bolus timing. No pulmonary embolus is seen. Calcified mediastinal and hilar lymph nodes could be due to old granulomatous disease or sarcoid. There is mild reticulation of pulmonary parenchyma best seen in the right middle lobe which may be due to early pulmonary changes of sarcoidosis. Cardiomegaly. Calcific aortic and coronary atherosclerosis. Electronically Signed   By: Inge Rise M.D.   On: 07/18/2016 20:02   Dg  Foot Complete Right  Result Date: 07/16/2016 CLINICAL DATA:  Osteomyelitis great toe EXAM: RIGHT FOOT COMPLETE - 3+ VIEW COMPARISON:  01/23/2016 FINDINGS: Soft tissue swelling and ulcer of the great toe. Negative for osteomyelitis. No fracture. Amputation of the second and third toes without osteomyelitis. Fourth and fifth toes appear intact Degenerative change in the midfoot.  Calcaneal spurring. IMPRESSION: Negative for osteomyelitis. Electronically Signed   By: Franchot Gallo M.D.   On: 07/16/2016 11:49    ASSESSMENT AND PLAN   1. Unstable angina pectoris ([place]) - Cath showed 3 vessels disease. The patient has decided against bypass surgery at this time. For LAD/ LCX -OM PCI/DES today. Currently chest pain free. HR in 90-100s consider increasing BB dose.  2. Chronic R foot wound - Seen by [facility]. ACE wrap. Had episode of bleeding yesterday. Pending MR of R foot to r/o abcess or osteomyelitis.   3. Chronic combined systolic and diastolic CHF (congestive heart failure) ([facility]) - Mild LE ED edema. Continue IV lasix $Remove'40mg'KgkSmxg$  qd. Total diuresis negative 2.6L. SOB stable.   4. HLD - Continue statin.   Signed, [name] PA-C Pager [phone] Agree with note by Vin Bhagat PA-C  Afeb. WBC stable. Awaiting MRI of foot to R/O osteo/abscess. If neg, MV PCI/ Stent today with Dr. Arrie Senate, M.D., [place], [facility], [place] [facility] [address] [place], [place]  [address]  [phone] 07/23/2016 10:24 AM  Addendum- Results of foot MRI just available. Osteomyelitis with soft tissue abscess. Will cancel Cath/ PCI for now. Get TRH, ID and ortho involved.  Lorretta Harp, M.D., [place], [facility], [place] [address]. [place], [place]  [address]  [phone] 07/23/2016 2:00 PM

## 2016-07-23 NOTE — Progress Notes (Signed)
Paged Dr. Raiford Simmonds to inform him of pt. blding from IV site previously, and that blding started again medium amt. And another pressure dressing placed on site (R) wrist; and he said to continue with pressure dressing again if needed.

## 2016-07-23 NOTE — Consult Note (Signed)
ORTHOPAEDIC CONSULTATION  REQUESTING PHYSICIAN: [name], MD  Chief Complaint: Osteomyelitis abscess right great toe  HPI: Joshua [name] is a 60 y.o. male who presents with purulent drainage from the abscess right great toe. Patient states he's been going to the wound center for debridement of the ulcer and presents with acute purulent drainage at this time.  Past Medical History:  Diagnosis Date  . Chronic combined systolic and diastolic CHF (congestive heart failure) ([place])    a. 07/2015 Echo: EF 45-50%, mod LVH, mid apical/antsept AK, Gr 1 DD.  [name] Coronary artery disease    a. 2012 s/p MI/PCI: s/p DES to mid/dist RCA and overlapping DES to LAD;  b. 07/2015 Cath: LAD 10% ISR, D1 40(jailed), LCX 64m, OM2 30, OM3 30, RCA 62m ISR, 10d ISR.  . Diabetic gastroparesis ([place])   . Essential hypertension   . GERD (gastroesophageal reflux disease)   . Gout   . Heart murmur   . Hyperlipemia   . Morbid obesity ([place])   . Myocardial infarction ([place]) 2012  . OSA on CPAP   . Polyneuropathy in diabetes(357.2)   . Pulmonary sarcoidosis ([place])    "problems w/it years ago; no problems anymore" (07/03/2014)  . Type II diabetes mellitus ([place])    Past Surgical History:  Procedure Laterality Date  . BREAST LUMPECTOMY Right ~ 2000   "benign"  . CARDIAC CATHETERIZATION N/A 07/30/2015   Procedure: Left Heart Cath and Coronary Angiography;  Surgeon: Burnell Blanks, MD;  Location: [place] [facility];  Service: Cardiovascular;  Laterality: N/A;  . CARDIAC CATHETERIZATION N/A 07/19/2016   Procedure: Left Heart Cath and Coronary Angiography;  Surgeon: Belva Crome, MD;  Location: [place] [facility];  Service: Cardiovascular;  Laterality: N/A;  . CATARACT EXTRACTION W/ INTRAOCULAR LENS  IMPLANT, BILATERAL Bilateral 2014-2015  . CORONARY ANGIOPLASTY WITH STENT PLACEMENT  10/31/2011   30 % MID ATRIOVENTRICULAR GROOVE CX STENOSIS. 90 % MID RCA.PTA TO LAD/STENTING OF TANDEM 60-70%. LAD STENOSIS 99%  REDUCED TO 0%.3.0 X 32MM PROMUS DES POSTDILATED TO 3.25MM.  [name] CORONARY ANGIOPLASTY WITH STENT PLACEMENT  07/03/2014   "2"  . I&D EXTREMITY Right 10/30/2013   Procedure: IRRIGATION AND DEBRIDEMENT RIGHT SMALL FINGER POSSIBLE SECONDARY WOUND CLOSURE;  Surgeon: [name], MD;  Location: [facility];  Service: Orthopedics;  Laterality: Right;  [name] is available after 4pm  . I&D EXTREMITY Right 11/01/2013   Procedure:  IRRIGATION AND DEBRIDEMENT RIGHT  PROXIMAL PHALANGEAL LEVEL AMPUTATION;  Surgeon: [name], MD;  Location: [facility];  Service: Orthopedics;  Laterality: Right;  . INCISION AND DRAINAGE Right 10/28/2013   Procedure: INCISION AND DRAINAGE Right small finger;  Surgeon: [name], MD;  Location: [facility];  Service: Orthopedics;  Laterality: Right;  . LEFT HEART CATH Left 11/03/2011   Procedure: LEFT HEART CATH;  Surgeon: [name], MD;  Location: [facility];  Service: Cardiovascular;  Laterality: Left;  . LEFT HEART CATHETERIZATION WITH CORONARY ANGIOGRAM N/A 07/03/2014   Procedure: LEFT HEART CATHETERIZATION WITH CORONARY ANGIOGRAM;  Surgeon: [name], MD;  [facility];  Service: Cardiovascular;  Laterality: N/A;  . PERCUTANEOUS CORONARY STENT INTERVENTION (PCI-S) Left 11/03/2011   Procedure: PERCUTANEOUS CORONARY STENT INTERVENTION (PCI-S);  Surgeon: [name], MD;  Location: [facility] CATH LAB;  Service: Cardiovascular;  Laterality: Left;  . TOE AMPUTATION Right ~ 2010   "2nd toe"  . TOE AMPUTATION Right 2012   "3rd toe"  Social History   Social History  . Marital status: Married    Spouse name: N/A  . Number of children: N/A  . Years of education: N/A   Social History Main Topics  . Smoking status: Never Smoker  . Smokeless tobacco: Never Used  . Alcohol use Yes     Comment: Pt sts he binge drinks on the weekends  . Drug use: No  . Sexual activity: Yes   Other Topics Concern  . None   Social History Narrative  . None    Family History  Problem Relation Age of Onset  . Heart attack Maternal Aunt   . Diabetes Maternal Grandmother   . Hypertension Maternal Grandmother    - negative except otherwise stated in the family history section Not on File Prior to Admission medications   Medication Sig Start Date End Date Taking? Authorizing Provider  allopurinol (ZYLOPRIM) 300 MG tablet Take 300 mg by mouth daily.    Yes Historical Provider, MD  amLODipine-olmesartan (AZOR) 10-40 MG tablet Take 1 tablet by mouth daily. 03/10/16  Yes [name], MD  aspirin EC 81 MG tablet Take 81 mg by mouth daily.   Yes Historical Provider, MD  doxazosin (CARDURA) 8 MG tablet Take 8 mg by mouth daily.  03/21/15  Yes Historical Provider, MD  furosemide (LASIX) 40 MG tablet TAKE 1 TABLET (40 MG TOTAL) BY MOUTH DAILY. 05/18/16  Yes [name], MD  hydrALAZINE (APRESOLINE) 25 MG tablet Take 1.5 tablets (37.5 mg total) by mouth 2 (two) times daily. Patient taking differently: Take 25 mg by mouth 2 (two) times daily.  02/16/16  Yes [name], MD  isosorbide mononitrate (IMDUR) 60 MG 24 hr tablet Take 1 tablet (60 mg total) by mouth daily. 08/08/15  Yes Rogelia Mire, NP  nitroGLYCERIN (NITROSTAT) 0.4 MG SL tablet Place 1 tablet (0.4 mg total) under the tongue every 5 (five) minutes as needed for chest pain. 10/19/13  Yes [name], MD  NOVOLIN N RELION 100 UNIT/ML injection Inject 50-60 Units into the skin See admin instructions. 50 units in the morning and 60 units in the evening 06/25/16  Yes Historical Provider, MD  NOVOLIN R RELION 100 UNIT/ML injection Inject 0-10 Units into the skin 3 (three) times daily before meals. Per sliding scale 06/25/16  Yes Historical Provider, MD  pantoprazole (PROTONIX) 40 MG tablet Take 1 tablet (40 mg total) by mouth daily. 09/02/15  Yes [name], MD  rosuvastatin (CRESTOR) 40 MG tablet Take 1 tablet (40 mg total) by mouth daily. 11/02/13  Yes Janece Canterbury, MD  ticagrelor  (BRILINTA) 90 MG TABS tablet Take 90 mg by mouth 2 (two) times daily.   Yes Historical Provider, MD  metoprolol succinate (TOPROL-XL) 100 MG 24 hr tablet Take 1 tablet (100 mg total) by mouth daily. Take with or immediately following a meal. Patient not taking: Reported on 07/18/2016 10/09/13   [name], MD  ticagrelor (BRILINTA) 60 MG TABS tablet Take 1 tablet (60 mg total) by mouth 2 (two) times daily. Patient not taking: Reported on 07/18/2016 02/16/16   [name], MD   Mr Foot Right Wo Contrast  Result Date: 07/23/2016 CLINICAL DATA:  Patient reports of bleeding wound of the right great toe and fluctuance on the bottom of the right foot. Question abscess or osteomyelitis. EXAM: MRI OF THE RIGHT FOREFOOT WITHOUT CONTRAST TECHNIQUE: Multiplanar, multisequence MR imaging was performed. No intravenous contrast was administered. COMPARISON:  MRI  right foot 04/11/2016. Plain films the right foot 01/23/2016 and 07/16/2016. FINDINGS: There is edema and corresponding decreased T1 signal throughout the distal phalanx of the great toe consistent with osteomyelitis. Small focus of marrow edema is seen in the head of the proximal phalanx of the great toe. The patient is status post amputation of the second toe, third toe and distal third metatarsal as seen on prior exams. Moderate first MTP osteoarthritis is identified. Mixed signal intensity fluid most consistent with abscess is seen in soft tissues of the pad of the great toe deep to the distal phalanx. The collection measures approximately 0.8 cm long by 1 cm craniocaudal by 0.4 cm transverse. Diffuse subcutaneous edema is present about the foot. There appears to be bandaging at the head of the proximal phalanx of the great toe on the medial side. Intrinsic musculature of the foot is atrophied. IMPRESSION: Findings consistent with osteomyelitis throughout the distal phalanx of the great toe with an abscess in the plantar soft tissues at the level of the  distal phalanx. Mild marrow edema in the head of the proximal phalanx of the great toe may be reactive or could reflect small focus of osteomyelitis. Soft tissue edema throughout the dorsum of the foot consistent with cellulitis or dependent change. First MTP osteoarthritis. Status post amputation of the second and third toes and distal third metatarsal. Electronically Signed   By: Inge Rise M.D.   On: 07/23/2016 13:07   - pertinent xrays, CT, MRI studies were reviewed and independently interpreted  Positive ROS: All other systems have been reviewed and were otherwise negative with the exception of those mentioned in the HPI and as above.  Physical Exam: General: Alert, no acute distress Psychiatric: Patient is competent for consent with normal mood and affect Lymphatic: No axillary or cervical lymphadenopathy Cardiovascular: Patient has massive edema of the right lower extremity. Respiratory: No cyanosis, no use of accessory musculature GI: No organomegaly, abdomen is soft and non-tender  Skin: Purulent drainage abscess that extends down to the MTP joint of the right great toe.   Neurologic: Patient does not have protective sensation bilateral lower extremities.   MUSCULOSKELETAL:  On examination patient does not have a palpable pulse due to the swelling. The MRI scan shows osteomyelitis involving the MTP joint of the right great toe he is status post amputations of the second and third toes.  Assessment: Assessment: Diabetic insensate neuropathy with venous stasis swelling and abscess osteomyelitis of the right great toe status post amputations of the second and third toe.  Plan: Plan: We will plan for a transmetatarsal amputation. Risks and benefits were discussed including persistent infection. Patient states he understands wishes to proceed at this time plan for surgery tomorrow anticipate patient can proceed with his cardiac angioplasty once we have resolve this  infection.  Thank you for the consult and the opportunity to see [name], [place] [phone] 4:18 PM

## 2016-07-23 NOTE — Progress Notes (Signed)
ANTICOAGULATION CONSULT NOTE - Follow Up Consult  Pharmacy Consult for Heparin Indication: 3V CAD  Not on File  Patient Measurements: Height: 5\' 9"  (175.3 cm) Weight: (!) 319 lb 1.6 oz (144.7 kg) IBW/kg (Calculated) : 70.7  Heparin dosing et: 105kg  Vital Signs: Temp: 99.1 F (37.3 C) (08/11 2101) Temp Source: Oral (08/11 2101) BP: 132/74 (08/11 2101) Pulse Rate: 95 (08/11 2101)  Labs:  Recent Labs  07/21/16 0318 07/21/16 1148 07/22/16 0445 07/23/16 0249 07/23/16 0445 07/23/16 1322 07/23/16 2113  HGB 11.0*  --  11.1* 10.8*  --   --   --   HCT 35.3*  --  35.1* 33.9*  --   --   --   PLT 142*  --  150 183  --   --   --   HEPARINUNFRC 0.29* 0.40 0.36 0.10*  --  0.28* 0.27*  CREATININE  --  1.61* 1.52*  --  1.51*  --   --     Estimated Creatinine Clearance: 76.6 mL/min (by C-G formula based on SCr of 1.51 mg/dL).  Assessment: 60 yo male s/p cath which showed 3V CAD. Pt refused CABG and for PCI eventually. MRI of foot consistent with osteo and abscess, so delaying PCI for now. Getting ID involved.  HL 0.27 < 0.28 after most rate increase to 1950 units/hr. Previous CBC stable.  Goal of Therapy:  Heparin level 0.3-0.7 units/ml Monitor platelets by anticoagulation protocol: Yes    Plan:  - Increase heparin to 2200 units/hr - HL in am  - Daily HL, CBC - Monitor s/sx of bleeding  Vincenza Hews, PharmD, BCPS 07/23/2016, 10:10 PM Pager: [phone]

## 2016-07-23 NOTE — Consult Note (Signed)
Date of Admission:  07/18/2016  Date of Consult:  07/23/2016  Reason for Consult Diabetic foot ulcer with abscess and osteomyelitis of great toe on the right Referring Physician: Dr. Gwenlyn Found   HPI:  Joshua [name] is an 60 y.o. male with nearly 91 year hx of DM, recently poorly controlled with A1C of 9-10 (though improved from 27 in October) who was admitted with unstable angina and found on cath to have 3 vessel disase. He has decided against CABG at this time and PIC was planned however in the interim a large amount of bloody , pussy material burst from his foot. He has had a chronic diabetic foot ulcer under his great toe for nearly 1.5 years and been going to the [facility] on [address] where he has been getting antibiotics, local therapy and I believe THIRTY dives in the hyberbaric chamber.   He states that there had been concern for bone infection and MRI was obtained in April  Which he says did not show bone infection. The report does mentoi T1 hypointensesignal within the first distal phalanx which may reflect fibrosis or necrosis.  There is also an MRI from April of 2016 that states that he had findings c/ow osteomyelits in the distal phalanx at that time.  During this admission he has  Had yet another MRI and this shows:  Findings consistent with osteomyelitis throughout the distal phalanx of the great toe with an abscess in the plantar soft tissues at the level of the distal phalanx.  Mild marrow edema in the head of the proximal phalanx of the great toe may be reactive or could reflect small focus of osteomyelitis  He is currently OFF antibiotics and he is not systemically ill. His blood cultures are negative (not sure why urine was cultured)    Past Medical History:  Diagnosis Date  . Chronic combined systolic and diastolic CHF (congestive heart failure) ([place])    a. 07/2015 Echo: EF 45-50%, mod LVH, mid apical/antsept AK, Gr 1 DD.  [name] Coronary artery disease    a. 2012 s/p MI/PCI: s/p DES to mid/dist RCA and overlapping DES to LAD;  b. 07/2015 Cath: LAD 10% ISR, D1 40(jailed), LCX 69m OM2 30, OM3 30, RCA 558mSR, 10d ISR.  . Diabetic gastroparesis ([facility]  . Essential hypertension   . GERD (gastroesophageal reflux disease)   . Gout   . Heart murmur   . Hyperlipemia   . Morbid obesity (HCM[facility]amor[facility]  . Myocardial infarction (HCTangier2012  . OSA on CPAP   . Polyneuropathy in diabetes(357.2)   . Pulmonary sarcoidosis (HCArroyo   "problems w/it years ago; no problems anymore" (07/03/2014)  . Type II diabetes mellitus (HCVelva    Past Surgical History:  Procedure Laterality Date  . BREAST LUMPECTOMY Right ~ 2000   "benign"  . CARDIAC CATHETERIZATION N/A 07/30/2015   Procedure: Left Heart Cath and Coronary Angiography;  Surgeon: ChBurnell BlanksMD;  [facility];  Service: Cardiovascular;  Laterality: N/A;  . CARDIAC CATHETERIZATION N/A 07/19/2016   Procedure: Left Heart Cath and Coronary Angiography;  Surgeon: HeBelva CromeMD;  [facility];  Service: Cardiovascular;  Laterality: N/A;  . CATARACT EXTRACTION W/ INTRAOCULAR LENS  IMPLANT, BILATERAL Bilateral 2014-2015  . CORONARY ANGIOPLASTY WITH STENT PLACEMENT  10/31/2011   30 % MID ATRIOVENTRICULAR GROOVE CX STENOSIS. 90 % MID RCA.PTA TO LAD/STENTING OF TANDEM 60-70%. LAD STENOSIS 99%  REDUCED TO 0%.3.0 X 32MM PROMUS DES POSTDILATED TO 3.25MM.  [name] CORONARY ANGIOPLASTY WITH STENT PLACEMENT  07/03/2014   "2"  . I&D EXTREMITY Right 10/30/2013   Procedure: IRRIGATION AND DEBRIDEMENT RIGHT SMALL FINGER POSSIBLE SECONDARY WOUND CLOSURE;  Surgeon: [name], MD;  Location: [facility];  Service: Orthopedics;  Laterality: Right;  [name] is available after 4pm  . I&D EXTREMITY Right 11/01/2013   Procedure:  IRRIGATION AND DEBRIDEMENT RIGHT  PROXIMAL PHALANGEAL LEVEL AMPUTATION;  Surgeon: [name], MD;  Location: [facility];  Service: Orthopedics;  Laterality: Right;  .  INCISION AND DRAINAGE Right 10/28/2013   Procedure: INCISION AND DRAINAGE Right small finger;  Surgeon: [name], MD;  Location: [facility];  Service: Orthopedics;  Laterality: Right;  . LEFT HEART CATH Left 11/03/2011   Procedure: LEFT HEART CATH;  Surgeon: [name], MD;  Location: [facility] CATH LAB;  Service: Cardiovascular;  Laterality: Left;  . LEFT HEART CATHETERIZATION WITH CORONARY ANGIOGRAM N/A 07/03/2014   Procedure: LEFT HEART CATHETERIZATION WITH CORONARY ANGIOGRAM;  Surgeon: [name], MD;  Location: [facility] CATH LAB;  Service: Cardiovascular;  Laterality: N/A;  . PERCUTANEOUS CORONARY STENT INTERVENTION (PCI-S) Left 11/03/2011   Procedure: PERCUTANEOUS CORONARY STENT INTERVENTION (PCI-S);  Surgeon: [name], MD;  Location: [facility] CATH LAB;  Service: Cardiovascular;  Laterality: Left;  . TOE AMPUTATION Right ~ 2010   "2nd toe"  . TOE AMPUTATION Right 2012   "3rd toe"    Social History:  reports that he has never smoked. He has never used smokeless tobacco. He reports that he drinks alcohol. He reports that he does not use drugs.   Family History  Problem Relation Age of Onset  . Heart attack Maternal Aunt   . Diabetes Maternal Grandmother   . Hypertension Maternal Grandmother     Not on File   Medications: I have reviewed patients current medications as documented in Epic Anti-infectives    None         ROS:  as in HPI and pertinent for angina symptoms and  otherwise remainder of 12 point Review of Systems is negative    Blood pressure (!) 155/72, pulse 99, temperature 99.3 F (37.4 C), temperature source Oral, resp. rate 18, height '5\' 9"'  (1.753 m), weight (!) 319 lb 1.6 oz (144.7 kg), SpO2 98 %. General: Alert and awake, oriented x3, not in any acute distress, obese HEENT: anicteric sclera,  EOMI, oropharynx clear and without exudate Cardiovascular: regular rate, normal r,  no murmur rubs or gallops Pulmonary: clear to auscultation bilaterally, no  wheezing, Gastrointestinal: soft nontender, nondistended, Musculoskeletal: Skin, soft tissue:    811/17 right foot:        Neuro: nonfocal, strength and sensation intact   Results for orders placed or performed during the hospital encounter of 07/18/16 (from the past 48 hour(s))  Glucose, capillary     Status: Abnormal   Collection Time: 07/21/16  4:31 PM  Result Value Ref Range   Glucose-Capillary 222 (H) 65 - 99 mg/dL   Comment 1 Notify RN    Comment 2 Document in Chart   Glucose, capillary     Status: Abnormal   Collection Time: 07/21/16  9:17 PM  Result Value Ref Range   Glucose-Capillary 165 (H) 65 - 99 mg/dL   Comment 1 Notify RN   Culture, blood (routine x 2)     Status: None (Preliminary result)   Collection Time: 07/21/16 10:40 PM  Result Value Ref  Range   Specimen Description BLOOD LEFT ARM    Special Requests BOTTLES DRAWN AEROBIC AND ANAEROBIC 10ML    Culture NO GROWTH 2 DAYS    Report Status PENDING   Culture, blood (routine x 2)     Status: None (Preliminary result)   Collection Time: 07/21/16 10:48 PM  Result Value Ref Range   Specimen Description BLOOD LEFT HAND    Special Requests BOTTLES DRAWN AEROBIC AND ANAEROBIC 10ML    Culture NO GROWTH 2 DAYS    Report Status PENDING   Urinalysis, Routine w reflex microscopic (not at [facility])     Status: Abnormal   Collection Time: 07/22/16 12:33 AM  Result Value Ref Range   Color, Urine YELLOW YELLOW   APPearance CLOUDY (A) CLEAR   Specific Gravity, Urine 1.021 1.005 - 1.030   pH 5.0 5.0 - 8.0   Glucose, UA NEGATIVE NEGATIVE mg/dL   Hgb urine dipstick SMALL (A) NEGATIVE   Bilirubin Urine NEGATIVE NEGATIVE   Ketones, ur NEGATIVE NEGATIVE mg/dL   Protein, ur >300 (A) NEGATIVE mg/dL   Nitrite NEGATIVE NEGATIVE   Leukocytes, UA NEGATIVE NEGATIVE  Culture, Urine     Status: None   Collection Time: 07/22/16 12:33 AM  Result Value Ref Range   Specimen Description URINE, CLEAN CATCH    Special Requests NONE     Culture NO GROWTH    Report Status 07/23/2016 FINAL   Urine microscopic-add on     Status: Abnormal   Collection Time: 07/22/16 12:33 AM  Result Value Ref Range   Squamous Epithelial / LPF 0-5 (A) NONE SEEN   WBC, UA 0-5 0 - 5 WBC/hpf   RBC / HPF 6-30 0 - 5 RBC/hpf   Bacteria, UA FEW (A) NONE SEEN   Casts HYALINE CASTS (A) NEGATIVE    Comment: GRANULAR CAST   Urine-Other MUCOUS PRESENT   Heparin level (unfractionated)     Status: None   Collection Time: 07/22/16  4:45 AM  Result Value Ref Range   Heparin Unfractionated 0.36 0.30 - 0.70 IU/mL    Comment:        IF HEPARIN RESULTS ARE BELOW EXPECTED VALUES, AND PATIENT DOSAGE HAS BEEN CONFIRMED, SUGGEST FOLLOW UP TESTING OF ANTITHROMBIN III LEVELS.   CBC     Status: Abnormal   Collection Time: 07/22/16  4:45 AM  Result Value Ref Range   WBC 10.8 (H) 4.0 - 10.5 K/uL   RBC 3.99 (L) 4.22 - 5.81 MIL/uL   Hemoglobin 11.1 (L) 13.0 - 17.0 g/dL   HCT 35.1 (L) 39.0 - 52.0 %   MCV 88.0 78.0 - 100.0 fL   MCH 27.8 26.0 - 34.0 pg   MCHC 31.6 30.0 - 36.0 g/dL   RDW 14.5 11.5 - 15.5 %   Platelets 150 150 - 400 K/uL  Basic metabolic panel     Status: Abnormal   Collection Time: 07/22/16  4:45 AM  Result Value Ref Range   Sodium 136 135 - 145 mmol/L   Potassium 3.6 3.5 - 5.1 mmol/L   Chloride 105 101 - 111 mmol/L   CO2 22 22 - 32 mmol/L   Glucose, Bld 156 (H) 65 - 99 mg/dL   BUN 19 6 - 20 mg/dL   Creatinine, Ser 1.52 (H) 0.61 - 1.24 mg/dL   Calcium 8.6 (L) 8.9 - 10.3 mg/dL   GFR calc non Af Amer 49 (L) >60 mL/min   GFR calc Af Amer 57 (L) >60 mL/min  Comment: (NOTE) The eGFR has been calculated using the CKD EPI equation. This calculation has not been validated in all clinical situations. eGFR's persistently <60 mL/min signify possible Chronic Kidney Disease.    Anion gap 9 5 - 15  Glucose, capillary     Status: Abnormal   Collection Time: 07/22/16  6:08 AM  Result Value Ref Range   Glucose-Capillary 170 (H) 65 - 99 mg/dL    Comment 1 Notify RN   Glucose, capillary     Status: Abnormal   Collection Time: 07/22/16 11:22 AM  Result Value Ref Range   Glucose-Capillary 178 (H) 65 - 99 mg/dL  Glucose, capillary     Status: Abnormal   Collection Time: 07/22/16  4:08 PM  Result Value Ref Range   Glucose-Capillary 166 (H) 65 - 99 mg/dL  Glucose, capillary     Status: Abnormal   Collection Time: 07/22/16  9:18 PM  Result Value Ref Range   Glucose-Capillary 177 (H) 65 - 99 mg/dL   Comment 1 Notify RN    Comment 2 Document in Chart   Heparin level (unfractionated)     Status: Abnormal   Collection Time: 07/23/16  2:49 AM  Result Value Ref Range   Heparin Unfractionated 0.10 (L) 0.30 - 0.70 IU/mL    Comment:        IF HEPARIN RESULTS ARE BELOW EXPECTED VALUES, AND PATIENT DOSAGE HAS BEEN CONFIRMED, SUGGEST FOLLOW UP TESTING OF ANTITHROMBIN III LEVELS.   CBC     Status: Abnormal   Collection Time: 07/23/16  2:49 AM  Result Value Ref Range   WBC 10.3 4.0 - 10.5 K/uL   RBC 3.91 (L) 4.22 - 5.81 MIL/uL   Hemoglobin 10.8 (L) 13.0 - 17.0 g/dL   HCT 33.9 (L) 39.0 - 52.0 %   MCV 86.7 78.0 - 100.0 fL   MCH 27.6 26.0 - 34.0 pg   MCHC 31.9 30.0 - 36.0 g/dL   RDW 14.6 11.5 - 15.5 %   Platelets 183 150 - 400 K/uL  Basic metabolic panel     Status: Abnormal   Collection Time: 07/23/16  4:45 AM  Result Value Ref Range   Sodium 136 135 - 145 mmol/L   Potassium 3.7 3.5 - 5.1 mmol/L   Chloride 104 101 - 111 mmol/L   CO2 20 (L) 22 - 32 mmol/L   Glucose, Bld 175 (H) 65 - 99 mg/dL   BUN 17 6 - 20 mg/dL   Creatinine, Ser 1.51 (H) 0.61 - 1.24 mg/dL   Calcium 8.6 (L) 8.9 - 10.3 mg/dL   GFR calc non Af Amer 50 (L) >60 mL/min   GFR calc Af Amer 57 (L) >60 mL/min    Comment: (NOTE) The eGFR has been calculated using the CKD EPI equation. This calculation has not been validated in all clinical situations. eGFR's persistently <60 mL/min signify possible Chronic Kidney Disease.    Anion gap 12 5 - 15  Glucose, capillary      Status: Abnormal   Collection Time: 07/23/16  6:19 AM  Result Value Ref Range   Glucose-Capillary 185 (H) 65 - 99 mg/dL   Comment 1 Notify RN    Comment 2 Document in Chart   Glucose, capillary     Status: Abnormal   Collection Time: 07/23/16 11:02 AM  Result Value Ref Range   Glucose-Capillary 176 (H) 65 - 99 mg/dL  Heparin level (unfractionated)     Status: Abnormal   Collection Time: 07/23/16  1:22 PM  Result Value Ref Range   Heparin Unfractionated 0.28 (L) 0.30 - 0.70 IU/mL    Comment:        IF HEPARIN RESULTS ARE BELOW EXPECTED VALUES, AND PATIENT DOSAGE HAS BEEN CONFIRMED, SUGGEST FOLLOW UP TESTING OF ANTITHROMBIN III LEVELS.   Glucose, capillary     Status: Abnormal   Collection Time: 07/23/16  1:47 PM  Result Value Ref Range   Glucose-Capillary 158 (H) 65 - 99 mg/dL   '@BRIEFLABTABLE' (sdes,specrequest,cult,reptstatus)   ) Recent Results (from the past 720 hour(s))  Culture, blood (routine x 2)     Status: None (Preliminary result)   Collection Time: 07/21/16 10:40 PM  Result Value Ref Range Status   Specimen Description BLOOD LEFT ARM  Final   Special Requests BOTTLES DRAWN AEROBIC AND ANAEROBIC 10ML  Final   Culture NO GROWTH 2 DAYS  Final   Report Status PENDING  Incomplete  Culture, blood (routine x 2)     Status: None (Preliminary result)   Collection Time: 07/21/16 10:48 PM  Result Value Ref Range Status   Specimen Description BLOOD LEFT HAND  Final   Special Requests BOTTLES DRAWN AEROBIC AND ANAEROBIC 10ML  Final   Culture NO GROWTH 2 DAYS  Final   Report Status PENDING  Incomplete  Culture, Urine     Status: None   Collection Time: 07/22/16 12:33 AM  Result Value Ref Range Status   Specimen Description URINE, CLEAN CATCH  Final   Special Requests NONE  Final   Culture NO GROWTH  Final   Report Status 07/23/2016 FINAL  Final     Impression/Recommendation  Principal Problem:   Unstable angina pectoris (HCC) Active Problems:   CAD in native  artery   Chronic combined systolic and diastolic CHF (congestive heart failure) (HCC)   Coronary artery disease   Hyperlipemia   Renal insufficiency   Joshua [name] is a 61 y.o. male with unstable angina with 3 V disease in need of PCI, with CKD, DM that is poorly controlled and with neuropathy, hx of prior amputations of 2 digits on same foot now with osteomyelitis in distal and possibly proximal phalanx of great toe and foot abscess  #1 DFU with osteomyelitis and foot abscess  --HOLD antibiotics --Dr. Sharol Given to see  I think he is going to need an amputation here to gain control of this infection  AGAIN holding antibiotics UNTIL we are getting DEEP CULTURES in the OR from bone and pus will allow me to tailor subsequent "mop up antibiotics" in a targetted way  Once we have such surgeries the empiric regimen I would recommend would be daptomycin and ceftriaxone  I will engage Diabetic Foot Focused Order Set  If he needs PCI with stent urgently I doubt the stents will become infected. This is a rare event EVEN in bacteremic patients--which he is not. However I appreciate need to gain control of infection in his foot and to avoid even this risk.     07/23/2016, 3:08 PM   Thank you so much for this interesting consult  [place] for [place] [phone] (pager) [phone] (office) 07/23/2016, 3:08 PM  Rhina [place] 07/23/2016, 3:08 PM

## 2016-07-23 NOTE — Progress Notes (Signed)
Pt. accidentally pulled IV out and moderate amt. blding from IV site (R) wrist area; pressure applied and pressure dressing applied; had to call for restart and Heparin drip and NTG restarted.

## 2016-07-23 NOTE — Progress Notes (Signed)
ANTICOAGULATION CONSULT NOTE - Follow Up Consult  Pharmacy Consult for Heparin  Indication: 3V CAD  Not on File  Patient Measurements: Height: 5\' 9"  (175.3 cm) Weight: (!) 319 lb 1.6 oz (144.7 kg) IBW/kg (Calculated) : 70.7  Heparin dosing et: 105kg  Vital Signs: Temp: 99.3 F (37.4 C) (08/11 0442) Temp Source: Oral (08/11 0442) BP: 161/87 (08/11 0442) Pulse Rate: 99 (08/11 0442)  Labs:  Recent Labs  07/21/16 0318 07/21/16 1148 07/22/16 0445 07/23/16 0249  HGB 11.0*  --  11.1* 10.8*  HCT 35.3*  --  35.1* 33.9*  PLT 142*  --  150 183  HEPARINUNFRC 0.29* 0.40 0.36 0.10*  CREATININE  --  1.61* 1.52*  --     Estimated Creatinine Clearance: 76.1 mL/min (by C-G formula based on SCr of 1.52 mg/dL).  Assessment: 60 yo male s/p cath which showed 3V CAD. Heparin re-started post cath and plans for PCI 8/11. Heparin level down to 0.1 but pt IV came out ~0315 and bleeding from IV site requiring pressure dressing. IV site has been replaced. CBC stable.  PCI scheduled for noon.  Goal of Therapy:  Heparin level 0.3-0.7 units/ml Monitor platelets by anticoagulation protocol: Yes   Plan:  -Continue heparin at 1850 units/hr -Will f/u post PCI  Sherlon Handing, PharmD, BCPS Clinical pharmacist, pager [phone]  07/23/2016 4:54 AM

## 2016-07-23 NOTE — Progress Notes (Addendum)
ANTICOAGULATION CONSULT NOTE - Follow Up Consult  Pharmacy Consult for Heparin Indication: 3V CAD  Not on File  Patient Measurements: Height: 5\' 9"  (175.3 cm) Weight: (!) 319 lb 1.6 oz (144.7 kg) IBW/kg (Calculated) : 70.7  Heparin dosing et: 105kg  Vital Signs: Temp: 99.3 F (37.4 C) (08/11 0442) Temp Source: Oral (08/11 0442) BP: 155/72 (08/11 1015) Pulse Rate: 99 (08/11 0442)  Labs:  Recent Labs  07/21/16 0318 07/21/16 1148 07/22/16 0445 07/23/16 0249 07/23/16 0445 07/23/16 1322  HGB 11.0*  --  11.1* 10.8*  --   --   HCT 35.3*  --  35.1* 33.9*  --   --   PLT 142*  --  150 183  --   --   HEPARINUNFRC 0.29* 0.40 0.36 0.10*  --  0.28*  CREATININE  --  1.61* 1.52*  --  1.51*  --     Estimated Creatinine Clearance: 76.6 mL/min (by C-G formula based on SCr of 1.51 mg/dL).  Assessment: 60 yo male s/p cath which showed 3V CAD. Pt refused CABG and for PCI eventually. MRI of foot consistent with osteo and abscess, so delaying PCI for now. Getting ID involved.  HL this afternoon was slightly low at 0.28 on 1850 units/hr. Of note, pt pulled out IV last night. No issues anymore per RN.   Goal of Therapy:  Heparin level 0.3-0.7 units/ml Monitor platelets by anticoagulation protocol: Yes   Plan:  - Increase heparin to 1950 units/hr - 6-hr HL @ 2130 - F/u plans for cath - Daily HL, CBC - Monitor s/sx of bleeding  Aarian Cleaver L. Nicole Kindred, PharmD Clinical Pharmacist Pager: [phone] 07/23/2016 3:07 PM

## 2016-07-23 NOTE — Plan of Care (Signed)
Problem: Skin Integrity: Goal: Risk for impaired skin integrity will decrease Outcome: Progressing Skin assessed daily and wound care assessed wound (R) foot 8/10.

## 2016-07-23 NOTE — Consult Note (Signed)
Medical Consultation   Joshua [name]  XAJ:[id number]  DOB: 07-10-1956  DOA: 07/18/2016  PCP: [name], MD   Outpatient Specialists: Requesting physician: Dr. Corky Downs Reason for consultation: Medical Management.   History of Present Illness: Joshua [name] is an 60 y.o. male with history of DM, pulmonary sarcoidosis, DM with Neuropathy, OSA on CPAP, Morbid obesity, MI, HL and CHF. Patient was admitted with unstable angina and PCI was planned but now on hold due to right great toe osteomyelitis and abscess for which amputation is now planned. Hospitalist team has been consulted to manage the multiple chronic medical problems. No headache, no neck pain, no chest pain, no SOB, no GI symptoms and no urinary symptoms.  Review of Systems:  ROS As per HPI otherwise 10 point review of systems negative.    Past Medical History: Past Medical History:  Diagnosis Date  . Chronic combined systolic and diastolic CHF (congestive heart failure) ([place])    a. 07/2015 Echo: EF 45-50%, mod LVH, mid apical/antsept AK, Gr 1 DD.  [name] Coronary artery disease    a. 2012 s/p MI/PCI: s/p DES to mid/dist RCA and overlapping DES to LAD;  b. 07/2015 Cath: LAD 10% ISR, D1 40(jailed), LCX 23m, OM2 30, OM3 30, RCA 57m ISR, 10d ISR.  . Diabetic gastroparesis ([place])   . Essential hypertension   . GERD (gastroesophageal reflux disease)   . Gout   . Heart murmur   . Hyperlipemia   . Morbid obesity ([place])   . Myocardial infarction ([place]) 2012  . OSA on CPAP   . Polyneuropathy in diabetes(357.2)   . Pulmonary sarcoidosis ([place])    "problems w/it years ago; no problems anymore" (07/03/2014)  . Type II diabetes mellitus ([place])     Past Surgical History: Past Surgical History:  Procedure Laterality Date  . BREAST LUMPECTOMY Right ~ 2000   "benign"  . CARDIAC CATHETERIZATION N/A 07/30/2015   Procedure: Left Heart Cath and Coronary Angiography;  Surgeon: Burnell Blanks, MD;   Location: [facility];  Service: Cardiovascular;  Laterality: N/A;  . CARDIAC CATHETERIZATION N/A 07/19/2016   Procedure: Left Heart Cath and Coronary Angiography;  Surgeon: Belva Crome, MD;  Location: [facility];  Service: Cardiovascular;  Laterality: N/A;  . CATARACT EXTRACTION W/ INTRAOCULAR LENS  IMPLANT, BILATERAL Bilateral 2014-2015  . CORONARY ANGIOPLASTY WITH STENT PLACEMENT  10/31/2011   30 % MID ATRIOVENTRICULAR GROOVE CX STENOSIS. 90 % MID RCA.PTA TO LAD/STENTING OF TANDEM 60-70%. LAD STENOSIS 99% REDUCED TO 0%.3.0 X 32MM PROMUS DES POSTDILATED TO 3.25MM.  [name] CORONARY ANGIOPLASTY WITH STENT PLACEMENT  07/03/2014   "2"  . I&D EXTREMITY Right 10/30/2013   Procedure: IRRIGATION AND DEBRIDEMENT RIGHT SMALL FINGER POSSIBLE SECONDARY WOUND CLOSURE;  Surgeon: [name], MD;  Location: [facility];  Service: Orthopedics;  Laterality: Right;  [name] is available after 4pm  . I&D EXTREMITY Right 11/01/2013   Procedure:  IRRIGATION AND DEBRIDEMENT RIGHT  PROXIMAL PHALANGEAL LEVEL AMPUTATION;  Surgeon: [name], MD;  Location: [facility];  Service: Orthopedics;  Laterality: Right;  . INCISION AND DRAINAGE Right 10/28/2013   Procedure: INCISION AND DRAINAGE Right small finger;  Surgeon: [name], MD;  Location: [facility];  Service: Orthopedics;  Laterality: Right;  . LEFT HEART CATH Left 11/03/2011   Procedure: LEFT HEART CATH;  Surgeon: [name], MD;  Location: [facility] CATH LAB;  Service: Cardiovascular;  Laterality: Left;  . LEFT HEART CATHETERIZATION WITH CORONARY ANGIOGRAM N/A 07/03/2014   Procedure: LEFT HEART CATHETERIZATION WITH CORONARY ANGIOGRAM;  Surgeon: [name], MD;  Location: [facility] CATH LAB;  Service: Cardiovascular;  Laterality: N/A;  . PERCUTANEOUS CORONARY STENT INTERVENTION (PCI-S) Left 11/03/2011   Procedure: PERCUTANEOUS CORONARY STENT INTERVENTION (PCI-S);  Surgeon: [name], MD;  Location: [facility] CATH LAB;  Service: Cardiovascular;   Laterality: Left;  . TOE AMPUTATION Right ~ 2010   "2nd toe"  . TOE AMPUTATION Right 2012   "3rd toe"     Allergies:  Not on File   Social History:  reports that he has never smoked. He has never used smokeless tobacco. He reports that he drinks alcohol. He reports that he does not use drugs.   Family History: Family History  Problem Relation Age of Onset  . Heart attack Maternal Aunt   . Diabetes Maternal Grandmother   . Hypertension Maternal Grandmother      Physical Exam: Vitals:   07/22/16 2002 07/22/16 2201 07/23/16 0442 07/23/16 1015  BP: (!) 153/73 129/69 (!) 161/87 (!) 155/72  Pulse: (!) 108 97 99   Resp: 20  18   Temp: (!) 100.5 F (38.1 C)  99.3 F (37.4 C)   TempSrc: Oral  Oral   SpO2: 94%  98%   Weight:      Height:        Constitutional: Morbid obesity. Alert and awake, oriented x3, not in any acute distress. Eyes: PERLA, EOMI, irises appear normal, anicteric sclera,  ENMT: external ears and nose appear normal,  Neck: neck appears normal, no masses, normal ROM, no thyromegaly, no JVD  CVS: S1-S2 clear. LE edema.  Respiratory:  clear to auscultation bilaterally, no wheezing, rales or rhonchi.  Abdomen: Morbidly obese. Soft, non tender. Organs are difficult to assess.  Musculoskeletal: Right great toe wound, not clean.                       Neuro: Moves all limbs. Awake and alert.   Data reviewed:  I have personally reviewed following labs and imaging studies Labs:  CBC:  Recent Labs Lab 07/18/16 1612 07/19/16 0508 07/21/16 0318 07/22/16 0445 07/23/16 0249  WBC 10.4 8.2 10.0 10.8* 10.3  HGB 13.0 11.4* 11.0* 11.1* 10.8*  HCT 40.8 36.4* 35.3* 35.1* 33.9*  MCV 89.1 88.6 88.7 88.0 86.7  PLT 166 148* 142* 150 384    Basic Metabolic Panel:  Recent Labs Lab 07/19/16 0508 07/20/16 0410 07/21/16 1148 07/22/16 0445 07/23/16 0445  NA 140 139 134* 136 136  K 3.7 3.6 3.7 3.6 3.7  CL 109 108 105 105 104  CO2 25 23 21* 22 20*  GLUCOSE 179*  107* 152* 156* 175*  BUN $Re'15 18 17 19 17  'Gxc$ CREATININE 1.45* 1.62* 1.61* 1.52* 1.51*  CALCIUM 8.7* 8.5* 8.9 8.6* 8.6*   GFR Estimated Creatinine Clearance: 76.6 mL/min (by C-G formula based on SCr of 1.51 mg/dL). Liver Function Tests: No results for input(s): AST, ALT, ALKPHOS, BILITOT, PROT, ALBUMIN in the last 168 hours. No results for input(s): LIPASE, AMYLASE in the last 168 hours. No results for input(s): AMMONIA in the last 168 hours. Coagulation profile  Recent Labs Lab 07/19/16 1105  INR 1.00    Cardiac Enzymes:  Recent Labs Lab 07/18/16 2357 07/19/16 0508 07/19/16 1253  TROPONINI 0.24* 0.18* 0.16*   BNP: Invalid input(s): POCBNP CBG:  Recent Labs Lab 07/22/16 2118  07/23/16 0619 07/23/16 1102 07/23/16 1347 07/23/16 1639  GLUCAP 177* 185* 176* 158* 160*   D-Dimer No results for input(s): DDIMER in the last 72 hours. Hgb A1c No results for input(s): HGBA1C in the last 72 hours. Lipid Profile No results for input(s): CHOL, HDL, LDLCALC, TRIG, CHOLHDL, LDLDIRECT in the last 72 hours. Thyroid function studies No results for input(s): TSH, T4TOTAL, T3FREE, THYROIDAB in the last 72 hours.  Invalid input(s): FREET3 Anemia work up No results for input(s): VITAMINB12, FOLATE, FERRITIN, TIBC, IRON, RETICCTPCT in the last 72 hours. Urinalysis    Component Value Date/Time   COLORURINE YELLOW 07/22/2016 0033   APPEARANCEUR CLOUDY (A) 07/22/2016 0033   LABSPEC 1.021 07/22/2016 0033   PHURINE 5.0 07/22/2016 0033   GLUCOSEU NEGATIVE 07/22/2016 0033   HGBUR SMALL (A) 07/22/2016 0033   BILIRUBINUR NEGATIVE 07/22/2016 0033   KETONESUR NEGATIVE 07/22/2016 0033   PROTEINUR >300 (A) 07/22/2016 0033   UROBILINOGEN 0.2 11/01/2011 1410   NITRITE NEGATIVE 07/22/2016 0033   LEUKOCYTESUR NEGATIVE 07/22/2016 0033     Sepsis Labs Invalid input(s): PROCALCITONIN,  WBC,  LACTICIDVEN Microbiology Recent Results (from the past 240 hour(s))  Culture, blood (routine x 2)      Status: None (Preliminary result)   Collection Time: 07/21/16 10:40 PM  Result Value Ref Range Status   Specimen Description BLOOD LEFT ARM  Final   Special Requests BOTTLES DRAWN AEROBIC AND ANAEROBIC  Final   Culture NO GROWTH 2 DAYS  Final   Report Status PENDING  Incomplete  Culture, blood (routine x 2)     Status: None (Preliminary result)   Collection Time: 07/21/16 10:48 PM  Result Value Ref Range Status   Specimen Description BLOOD LEFT HAND  Final   Special Requests BOTTLES DRAWN AEROBIC AND ANAEROBIC  Final   Culture NO GROWTH 2 DAYS  Final   Report Status PENDING  Incomplete  Culture, Urine     Status: None   Collection Time: 07/22/16 12:33 AM  Result Value Ref Range Status   Specimen Description URINE, CLEAN CATCH  Final   Special Requests NONE  Final   Culture NO GROWTH  Final   Report Status 07/23/2016 FINAL  Final       Inpatient Medications:   Scheduled Meds: . allopurinol  300 mg Oral Daily  . amLODipine  10 mg Oral Daily  . antiseptic oral rinse  7 mL Mouth Rinse BID  . aspirin  81 mg Oral Daily  . doxazosin  8 mg Oral Daily  . furosemide  40 mg Oral Daily  . hydrALAZINE  37.5 mg Oral BID  . insulin aspart  0-20 Units Subcutaneous TID WC  . insulin NPH Human  20 Units Subcutaneous QHS   And  . insulin NPH Human  20 Units Subcutaneous QAC breakfast  . isosorbide mononitrate  60 mg Oral Daily  . metoprolol succinate  50 mg Oral Daily  . pantoprazole  40 mg Oral Daily  . rosuvastatin  40 mg Oral Daily  . sodium chloride flush  3 mL Intravenous Q12H  . sodium chloride flush  3 mL Intravenous Q12H  . ticagrelor  90 mg Oral BID   Continuous Infusions: . sodium chloride 1 mL/kg/hr (07/23/16 0844)  . heparin 1,950 Units/hr (07/23/16 1509)  . nitroGLYCERIN 15 mcg/min (07/23/16 0218)     Radiological Exams on Admission: Mr Foot Right Wo Contrast  Result Date: 07/23/2016 CLINICAL DATA:  Patient reports of bleeding wound of the right great  toe  and fluctuance on the bottom of the right foot. Question abscess or osteomyelitis. EXAM: MRI OF THE RIGHT FOREFOOT WITHOUT CONTRAST TECHNIQUE: Multiplanar, multisequence MR imaging was performed. No intravenous contrast was administered. COMPARISON:  MRI right foot 04/11/2016. Plain films the right foot 01/23/2016 and 07/16/2016. FINDINGS: There is edema and corresponding decreased T1 signal throughout the distal phalanx of the great toe consistent with osteomyelitis. Small focus of marrow edema is seen in the head of the proximal phalanx of the great toe. The patient is status post amputation of the second toe, third toe and distal third metatarsal as seen on prior exams. Moderate first MTP osteoarthritis is identified. Mixed signal intensity fluid most consistent with abscess is seen in soft tissues of the pad of the great toe deep to the distal phalanx. The collection measures approximately 0.8 cm long by 1 cm craniocaudal by 0.4 cm transverse. Diffuse subcutaneous edema is present about the foot. There appears to be bandaging at the head of the proximal phalanx of the great toe on the medial side. Intrinsic musculature of the foot is atrophied. IMPRESSION: Findings consistent with osteomyelitis throughout the distal phalanx of the great toe with an abscess in the plantar soft tissues at the level of the distal phalanx. Mild marrow edema in the head of the proximal phalanx of the great toe may be reactive or could reflect small focus of osteomyelitis. Soft tissue edema throughout the dorsum of the foot consistent with cellulitis or dependent change. First MTP osteoarthritis. Status post amputation of the second and third toes and distal third metatarsal. Electronically Signed   By: Inge Rise M.D.   On: 07/23/2016 13:07    Impression/Recommendations Principal Problem:   Unstable angina pectoris ([place]) - As per Cardiology. Active Problems:   CAD in native artery   Chronic combined systolic and  diastolic CHF (congestive heart failure) ([facility])   Coronary artery disease   Hyperlipemia   Renal insufficiency   Penetrating wound of right foot   Type 1 diabetes mellitus with right diabetic foot ulcer ([facility]) - Optimize   Chronic osteomyelitis of toe of right foot ([place]) - Likely surgery in am. Ortho and ID consultants following.   CKD (chronic kidney disease) stage 3, GFR 30-59 ml/min   Thank you for this consultation.  Our [facility] hospitalist team will follow the patient with you.   Time Spent: Greater than 60 Minutes.  Dana Allan, MD  [facility] Pager #: [phone] 7PM-7AM contact night coverage as above

## 2016-07-24 ENCOUNTER — Inpatient Hospital Stay (HOSPITAL_COMMUNITY): Payer: BLUE CROSS/BLUE SHIELD | Admitting: Anesthesiology

## 2016-07-24 ENCOUNTER — Encounter (HOSPITAL_COMMUNITY)
Admission: EM | Disposition: A | Discharge status: Rehabilitation Facility | Payer: Self-pay | Source: Home / Self Care | Attending: Cardiovascular Disease

## 2016-07-24 DIAGNOSIS — L03119 Cellulitis of unspecified part of limb: Secondary | ICD-10-CM

## 2016-07-24 DIAGNOSIS — Z89411 Acquired absence of right great toe: Secondary | ICD-10-CM

## 2016-07-24 DIAGNOSIS — M86671 Other chronic osteomyelitis, right ankle and foot: Secondary | ICD-10-CM

## 2016-07-24 DIAGNOSIS — L02419 Cutaneous abscess of limb, unspecified: Secondary | ICD-10-CM | POA: Diagnosis present

## 2016-07-24 HISTORY — PX: AMPUTATION: SHX166

## 2016-07-24 LAB — CK: Total CK: 338 U/L (ref 49–397)

## 2016-07-24 LAB — BASIC METABOLIC PANEL
Anion gap: 8 (ref 5–15)
BUN: 18 mg/dL (ref 6–20)
CO2: 21 mmol/L — ABNORMAL LOW (ref 22–32)
Calcium: 8.4 mg/dL — ABNORMAL LOW (ref 8.9–10.3)
Chloride: 106 mmol/L (ref 101–111)
Creatinine, Ser: 1.53 mg/dL — ABNORMAL HIGH (ref 0.61–1.24)
GFR calc Af Amer: 57 mL/min — ABNORMAL LOW (ref >60)
GFR calc non Af Amer: 49 mL/min — ABNORMAL LOW (ref >60)
Glucose, Bld: 187 mg/dL — ABNORMAL HIGH (ref 65–99)
Potassium: 3.8 mmol/L (ref 3.5–5.1)
Sodium: 135 mmol/L (ref 135–145)

## 2016-07-24 LAB — CBC
HCT: 31 % — ABNORMAL LOW (ref 39.0–52.0)
Hemoglobin: 9.8 g/dL — ABNORMAL LOW (ref 13.0–17.0)
MCH: 27.2 pg (ref 26.0–34.0)
MCHC: 31.6 g/dL (ref 30.0–36.0)
MCV: 86.1 fL (ref 78.0–100.0)
Platelets: 189 10*3/uL (ref 150–400)
RBC: 3.6 MIL/uL — ABNORMAL LOW (ref 4.22–5.81)
RDW: 14.6 % (ref 11.5–15.5)
WBC: 7.7 10*3/uL (ref 4.0–10.5)

## 2016-07-24 LAB — GLUCOSE, CAPILLARY
Glucose-Capillary: 160 mg/dL — ABNORMAL HIGH (ref 65–99)
Glucose-Capillary: 159 mg/dL — ABNORMAL HIGH (ref 65–99)
Glucose-Capillary: 171 mg/dL — ABNORMAL HIGH (ref 65–99)
Glucose-Capillary: 171 mg/dL — ABNORMAL HIGH (ref 65–99)
Glucose-Capillary: 238 mg/dL — ABNORMAL HIGH (ref 65–99)
Glucose-Capillary: 157 mg/dL — ABNORMAL HIGH (ref 65–99)

## 2016-07-24 LAB — HIV ANTIBODY (ROUTINE TESTING W REFLEX): HIV Screen 4th Generation wRfx: NONREACTIVE

## 2016-07-24 LAB — HEPARIN LEVEL (UNFRACTIONATED): Heparin Unfractionated: 0.34 IU/mL (ref 0.30–0.70)

## 2016-07-24 LAB — SEDIMENTATION RATE: Sed Rate: 131 mm/hr — ABNORMAL HIGH (ref 0–16)

## 2016-07-24 LAB — PREALBUMIN: Prealbumin: 8.5 mg/dL — ABNORMAL LOW (ref 18–38)

## 2016-07-24 SURGERY — AMPUTATION, FOOT, PARTIAL
Anesthesia: General | Site: Foot | Laterality: Right

## 2016-07-24 MED ORDER — MEPERIDINE HCL 25 MG/ML IJ SOLN: 6.2500 mg | INTRAMUSCULAR | Status: DC | PRN

## 2016-07-24 MED ORDER — ACETAMINOPHEN 325 MG PO TABS: 650.0000 mg | ORAL_TABLET | Freq: Four times a day (QID) | ORAL | Status: DC | PRN

## 2016-07-24 MED ORDER — CEFAZOLIN SODIUM-DEXTROSE 2-3 GM-% IV SOLR
INTRAVENOUS | Status: DC | PRN
  Administered 2016-07-24: 1 g via INTRAVENOUS
  Administered 2016-07-24: 2 g via INTRAVENOUS

## 2016-07-24 MED ORDER — LIDOCAINE 2% (20 MG/ML) 5 ML SYRINGE
INTRAMUSCULAR | Status: AC
  Filled 2016-07-24: qty 5

## 2016-07-24 MED ORDER — SUCCINYLCHOLINE CHLORIDE 200 MG/10ML IV SOSY
PREFILLED_SYRINGE | INTRAVENOUS | Status: AC
  Filled 2016-07-24: qty 10

## 2016-07-24 MED ORDER — HYDROMORPHONE HCL 1 MG/ML IJ SOLN
INTRAMUSCULAR | Status: AC
  Filled 2016-07-24: qty 1

## 2016-07-24 MED ORDER — CEFAZOLIN IN D5W 1 GM/50ML IV SOLN
INTRAVENOUS | Status: AC
  Filled 2016-07-24: qty 50

## 2016-07-24 MED ORDER — SODIUM CHLORIDE 0.9 % IV SOLN
INTRAVENOUS | Status: DC
  Administered 2016-07-24: 10 mL via INTRAVENOUS
  Administered 2016-07-31: 13:00:00 via INTRAVENOUS

## 2016-07-24 MED ORDER — PROPOFOL 10 MG/ML IV BOLUS
INTRAVENOUS | Status: AC
  Filled 2016-07-24: qty 20

## 2016-07-24 MED ORDER — ONDANSETRON HCL 4 MG/2ML IJ SOLN
INTRAMUSCULAR | Status: AC
Start: 2016-07-24 — End: 2016-07-24
  Filled 2016-07-24: qty 2

## 2016-07-24 MED ORDER — ONDANSETRON HCL 4 MG/2ML IJ SOLN
INTRAMUSCULAR | Status: DC | PRN
  Administered 2016-07-24: 4 mg via INTRAVENOUS

## 2016-07-24 MED ORDER — HYDROMORPHONE HCL 1 MG/ML IJ SOLN
1.0000 mg | INTRAMUSCULAR | Status: DC | PRN
  Administered 2016-07-24 – 2016-07-26 (×3): 1 mg via INTRAVENOUS
  Filled 2016-07-24 (×3): qty 1

## 2016-07-24 MED ORDER — HYDROMORPHONE HCL 1 MG/ML IJ SOLN
0.2500 mg | INTRAMUSCULAR | Status: DC | PRN
  Administered 2016-07-24 (×3): 0.5 mg via INTRAVENOUS

## 2016-07-24 MED ORDER — METOCLOPRAMIDE HCL 5 MG/ML IJ SOLN
5.0000 mg | Freq: Three times a day (TID) | INTRAMUSCULAR | Status: DC | PRN
  Filled 2016-07-24: qty 2

## 2016-07-24 MED ORDER — 0.9 % SODIUM CHLORIDE (POUR BTL) OPTIME
TOPICAL | Status: DC | PRN
  Administered 2016-07-24: 1000 mL

## 2016-07-24 MED ORDER — DEXTROSE 5 % IV SOLN
2.0000 g | INTRAVENOUS | Status: DC
  Administered 2016-07-24: 2 g via INTRAVENOUS
  Filled 2016-07-24 (×2): qty 2

## 2016-07-24 MED ORDER — METHOCARBAMOL 500 MG PO TABS
500.0000 mg | ORAL_TABLET | Freq: Four times a day (QID) | ORAL | Status: DC | PRN
  Administered 2016-07-28 – 2016-07-29 (×2): 500 mg via ORAL
  Filled 2016-07-24 (×2): qty 1

## 2016-07-24 MED ORDER — SODIUM CHLORIDE 0.9 % IV SOLN
INTRAVENOUS | Status: DC | PRN
  Administered 2016-07-24: 09:00:00 via INTRAVENOUS

## 2016-07-24 MED ORDER — LACTATED RINGERS IV SOLN: INTRAVENOUS | Status: DC

## 2016-07-24 MED ORDER — SODIUM CHLORIDE 0.9 % IV SOLN
1000.0000 mg | INTRAVENOUS | Status: DC
  Administered 2016-07-24: 1000 mg via INTRAVENOUS
  Filled 2016-07-24 (×2): qty 20

## 2016-07-24 MED ORDER — INSULIN NPH (HUMAN) (ISOPHANE) 100 UNIT/ML ~~LOC~~ SUSP
20.0000 [IU] | Freq: Every day | SUBCUTANEOUS | Status: DC
  Administered 2016-07-25 – 2016-08-03 (×8): 20 [IU] via SUBCUTANEOUS
  Filled 2016-07-24: qty 10

## 2016-07-24 MED ORDER — FENTANYL CITRATE (PF) 250 MCG/5ML IJ SOLN
INTRAMUSCULAR | Status: DC | PRN
  Administered 2016-07-24: 125 ug via INTRAVENOUS
  Administered 2016-07-24: 50 ug via INTRAVENOUS

## 2016-07-24 MED ORDER — HEPARIN (PORCINE) IN NACL 100-0.45 UNIT/ML-% IJ SOLN
2250.0000 [IU]/h | INTRAMUSCULAR | Status: DC
  Administered 2016-07-25: 2200 [IU]/h via INTRAVENOUS
  Administered 2016-07-26: 2500 [IU]/h via INTRAVENOUS
  Filled 2016-07-24 (×4): qty 250

## 2016-07-24 MED ORDER — ACETAMINOPHEN 650 MG RE SUPP: 650.0000 mg | Freq: Four times a day (QID) | RECTAL | Status: DC | PRN

## 2016-07-24 MED ORDER — ONDANSETRON HCL 4 MG/2ML IJ SOLN: 4.0000 mg | Freq: Four times a day (QID) | INTRAMUSCULAR | Status: DC | PRN

## 2016-07-24 MED ORDER — METOCLOPRAMIDE HCL 5 MG PO TABS
5.0000 mg | ORAL_TABLET | Freq: Three times a day (TID) | ORAL | Status: DC | PRN
Start: 2016-07-24 — End: 2016-08-03
  Filled 2016-07-24: qty 2

## 2016-07-24 MED ORDER — PHENYLEPHRINE HCL 10 MG/ML IJ SOLN
INTRAMUSCULAR | Status: DC | PRN
Start: 2016-07-24 — End: 2016-07-24
  Administered 2016-07-24: 80 ug via INTRAVENOUS
  Administered 2016-07-24: 60 ug via INTRAVENOUS
  Administered 2016-07-24: 100 ug via INTRAVENOUS

## 2016-07-24 MED ORDER — FENTANYL CITRATE (PF) 250 MCG/5ML IJ SOLN
INTRAMUSCULAR | Status: AC
Start: 2016-07-24 — End: 2016-07-24
  Filled 2016-07-24: qty 5

## 2016-07-24 MED ORDER — CEFAZOLIN SODIUM-DEXTROSE 2-4 GM/100ML-% IV SOLN
INTRAVENOUS | Status: AC
  Filled 2016-07-24: qty 100

## 2016-07-24 MED ORDER — PROPOFOL 10 MG/ML IV BOLUS
INTRAVENOUS | Status: DC | PRN
Start: 2016-07-24 — End: 2016-07-24
  Administered 2016-07-24: 50 mg via INTRAVENOUS
  Administered 2016-07-24: 100 mg via INTRAVENOUS

## 2016-07-24 MED ORDER — PHENYLEPHRINE 40 MCG/ML (10ML) SYRINGE FOR IV PUSH (FOR BLOOD PRESSURE SUPPORT)
PREFILLED_SYRINGE | INTRAVENOUS | Status: AC
  Filled 2016-07-24: qty 10

## 2016-07-24 MED ORDER — MIDAZOLAM HCL 2 MG/2ML IJ SOLN
INTRAMUSCULAR | Status: AC
  Filled 2016-07-24: qty 2

## 2016-07-24 MED ORDER — DEXTROSE 5 % IV SOLN
500.0000 mg | Freq: Four times a day (QID) | INTRAVENOUS | Status: DC | PRN
  Filled 2016-07-24: qty 5

## 2016-07-24 MED ORDER — LIDOCAINE 2% (20 MG/ML) 5 ML SYRINGE
INTRAMUSCULAR | Status: DC | PRN
  Administered 2016-07-24: 50 mg via INTRAVENOUS

## 2016-07-24 MED ORDER — SUCCINYLCHOLINE CHLORIDE 200 MG/10ML IV SOSY
PREFILLED_SYRINGE | INTRAVENOUS | Status: DC | PRN
  Administered 2016-07-24: 120 mg via INTRAVENOUS

## 2016-07-24 MED ORDER — PROMETHAZINE HCL 25 MG/ML IJ SOLN: 6.2500 mg | INTRAMUSCULAR | Status: DC | PRN

## 2016-07-24 MED ORDER — MIDAZOLAM HCL 2 MG/2ML IJ SOLN
INTRAMUSCULAR | Status: DC | PRN
  Administered 2016-07-24: 2 mg via INTRAVENOUS

## 2016-07-24 MED ORDER — ONDANSETRON HCL 4 MG PO TABS: 4.0000 mg | ORAL_TABLET | Freq: Four times a day (QID) | ORAL | Status: DC | PRN

## 2016-07-24 MED ORDER — INSULIN NPH (HUMAN) (ISOPHANE) 100 UNIT/ML ~~LOC~~ SUSP
22.0000 [IU] | Freq: Every day | SUBCUTANEOUS | Status: DC
  Administered 2016-07-24 – 2016-08-02 (×9): 22 [IU] via SUBCUTANEOUS
  Filled 2016-07-24 (×2): qty 10

## 2016-07-24 SURGICAL SUPPLY — 30 items
BLADE SAW SGTL HD 18.5X60.5X1. (BLADE) ×2 IMPLANT
BLADE SURG 10 STRL SS (BLADE) IMPLANT
BNDG COHESIVE 4X5 TAN STRL (GAUZE/BANDAGES/DRESSINGS) ×4 IMPLANT
BNDG GAUZE ELAST 4 BULKY (GAUZE/BANDAGES/DRESSINGS) ×4 IMPLANT
COVER SURGICAL LIGHT HANDLE (MISCELLANEOUS) ×4 IMPLANT
DRAPE U-SHAPE 47X51 STRL (DRAPES) ×2 IMPLANT
DRSG ADAPTIC 3X8 NADH LF (GAUZE/BANDAGES/DRESSINGS) ×2 IMPLANT
DRSG OPSITE 11X17.75 LRG (GAUZE/BANDAGES/DRESSINGS) ×1 IMPLANT
DRSG PAD ABDOMINAL 8X10 ST (GAUZE/BANDAGES/DRESSINGS) ×2 IMPLANT
DURAPREP 26ML APPLICATOR (WOUND CARE) ×2 IMPLANT
ELECT REM PT RETURN 9FT ADLT (ELECTROSURGICAL) ×2
ELECTRODE REM PT RTRN 9FT ADLT (ELECTROSURGICAL) ×1 IMPLANT
GAUZE SPONGE 4X4 12PLY STRL (GAUZE/BANDAGES/DRESSINGS) ×2 IMPLANT
GLOVE BIOGEL PI IND STRL 9 (GLOVE) ×1 IMPLANT
GLOVE BIOGEL PI INDICATOR 9 (GLOVE) ×1
GLOVE SURG ORTHO 9.0 STRL STRW (GLOVE) ×2 IMPLANT
GOWN STRL REUS W/ TWL XL LVL3 (GOWN DISPOSABLE) ×3 IMPLANT
GOWN STRL REUS W/TWL XL LVL3 (GOWN DISPOSABLE) ×6
KIT BASIN OR (CUSTOM PROCEDURE TRAY) ×2 IMPLANT
KIT PREVENA INCISION MGT 13 (CANNISTER) ×1 IMPLANT
KIT ROOM TURNOVER OR (KITS) ×2 IMPLANT
NS IRRIG 1000ML POUR BTL (IV SOLUTION) ×2 IMPLANT
PACK ORTHO EXTREMITY (CUSTOM PROCEDURE TRAY) ×2 IMPLANT
PAD ARMBOARD 7.5X6 YLW CONV (MISCELLANEOUS) ×4 IMPLANT
SPONGE LAP 18X18 X RAY DECT (DISPOSABLE) ×2 IMPLANT
SUT ETHILON 2 0 PSLX (SUTURE) ×6 IMPLANT
SUT VIC AB 2-0 CTB1 (SUTURE) IMPLANT
TOWEL OR 17X24 6PK STRL BLUE (TOWEL DISPOSABLE) ×2 IMPLANT
TOWEL OR 17X26 10 PK STRL BLUE (TOWEL DISPOSABLE) ×2 IMPLANT
WATER STERILE IRR 1000ML POUR (IV SOLUTION) ×2 IMPLANT

## 2016-07-24 NOTE — Progress Notes (Signed)
ANTIBIOTIC CONSULT NOTE - INITIAL  Pharmacy Consult for daptomycin dosing.  Indication: diabetic foot/osteomyelitis   Patient Measurements: Height: 5\' 9"  (175.3 cm) Weight: (!) 319 lb 1.6 oz (144.7 kg) IBW/kg (Calculated) : 70.7   Vital Signs: Temp: 98.8 F (37.1 C) (08/12 1048) Temp Source: Oral (08/12 0444) BP: 139/76 (08/12 1109) Pulse Rate: 89 (08/12 1109) Intake/Output from previous day: 08/11 0701 - 08/12 0700 In: 1281.3 [P.O.:480; I.V.:801.3] Out: 2350 [Urine:2350] Intake/Output from this shift: Total I/O In: 400 [I.V.:400] Out: 10 [Blood:10]  Labs:  Recent Labs  07/22/16 0445 07/23/16 0249 07/23/16 0445 07/24/16 0359  WBC 10.8* 10.3  --  7.7  HGB 11.1* 10.8*  --  9.8*  PLT 150 183  --  189  CREATININE 1.52*  --  1.51*  --     Medications:  Scheduled:  . allopurinol  300 mg Oral Daily  . amLODipine  10 mg Oral Daily  . antiseptic oral rinse  7 mL Mouth Rinse BID  . aspirin  81 mg Oral Daily  . ceFAZolin      . ceFAZolin      . cefTRIAXone (ROCEPHIN)  IV  2 g Intravenous Q24H  . DAPTOmycin (CUBICIN)  IV  1,000 mg Intravenous Q24H  . doxazosin  8 mg Oral Daily  . furosemide  40 mg Oral Daily  . hydrALAZINE  37.5 mg Oral BID  . HYDROmorphone      . HYDROmorphone      . insulin aspart  0-20 Units Subcutaneous TID WC  . insulin NPH Human  20 Units Subcutaneous QHS   And  . insulin NPH Human  20 Units Subcutaneous QAC breakfast  . isosorbide mononitrate  60 mg Oral Daily  . metoprolol succinate  50 mg Oral Daily  . pantoprazole  40 mg Oral Daily  . sodium chloride flush  3 mL Intravenous Q12H  . ticagrelor  90 mg Oral BID   Assessment: Joshua [name] is a 60 yo male who presented with a diabetic foot ulcer, osteomyelitis, and foot abscess. Patient had an amputation today (8/12) and deep cultures were obtained in the OR. ID recommended dapto/ceftriaxone as an empiric regimen until deep cultures grow out.   SCr- 1.51 CrCl ~70 ml/min  CTX 8/12>> Dapto  8/12>>  8/10 UC: neg 8/10 BCx2: ngtd 8/11 mrsa pcr: neg  Plan:  Daptomycin 1000 mg IV Q 24 hours- ~ 7 mg/kg  Stat CK and weekly- Next Due: 8/19 Monitor Cx, clinical progress   [place], [place] Resident  [phone] 07/24/2016,12:33 PM

## 2016-07-24 NOTE — Progress Notes (Signed)
ANTICOAGULATION CONSULT NOTE - Follow Up Consult  Pharmacy Consult for Heparin Indication: 3V CAD  Not on File  Patient Measurements: Height: 5\' 9"  (175.3 cm) Weight: (!) 319 lb 1.6 oz (144.7 kg) IBW/kg (Calculated) : 70.7  Heparin dosing et: 105kg  Vital Signs: Temp: 98.8 F (37.1 C) (08/12 1048) Temp Source: Oral (08/12 0444) BP: 139/76 (08/12 1109) Pulse Rate: 89 (08/12 1109)  Labs:  Recent Labs  07/22/16 0445 07/23/16 0249 07/23/16 0445 07/23/16 1322 07/23/16 2113 07/24/16 0359  HGB 11.1* 10.8*  --   --   --  9.8*  HCT 35.1* 33.9*  --   --   --  31.0*  PLT 150 183  --   --   --  189  HEPARINUNFRC 0.36 0.10*  --  0.28* 0.27* 0.34  CREATININE 1.52*  --  1.51*  --   --   --     Estimated Creatinine Clearance: 76.6 mL/min (by C-G formula based on SCr of 1.51 mg/dL).  Assessment: 60 yo male s/p cath which showed 3V CAD. Pt refused CABG and for PCI eventually. MRI of foot consistent with osteo and abscess, so delaying PCI and now s/p transmetatarsal amputation. HL therapeutic x1 on 2200 units/h. CBC stable. Per RN, heparin off post-op 8/12 and will resume tomorrow per Dr. Sharol Given. No bleed issues noted.  Goal of Therapy:  Heparin level 0.3-0.7 units/ml Monitor platelets by anticoagulation protocol: Yes    Plan:  - Heparin off for now post-op. Resume tomorrow per Dr. Sharol Given at 0600 at previous rate - Daily HL, CBC - Monitor s/sx of bleeding  Elicia Lamp, PharmD, [facility] Clinical Pharmacist Pager [phone] 07/24/2016 11:57 AM

## 2016-07-24 NOTE — Anesthesia Procedure Notes (Signed)
Procedure Name: Intubation Date/Time: 07/24/2016 9:07 AM Performed by: Marinda Elk A Pre-anesthesia Checklist: Patient identified, Emergency Drugs available, Suction available, Patient being monitored and Timeout performed Patient Re-evaluated:Patient Re-evaluated prior to inductionOxygen Delivery Method: Circle System Utilized and Circle system utilized Preoxygenation: Pre-oxygenation with 100% oxygen Intubation Type: IV induction Ventilation: Mask ventilation without difficulty Laryngoscope Size: Mac and 3 Grade View: Grade II Tube type: Oral Tube size: 7.5 mm Number of attempts: 1 Airway Equipment and Method: Stylet Placement Confirmation: ETT inserted through vocal cords under direct vision,  positive ETCO2 and breath sounds checked- equal and bilateral Secured at: 22 cm Tube secured with: Tape Dental Injury: Teeth and Oropharynx as per pre-operative assessment

## 2016-07-24 NOTE — Interval H&P Note (Signed)
History and Physical Interval Note:  07/24/2016 6:51 AM  Joshua [name]  has presented today for surgery, with the diagnosis of osteomylitis  The various methods of treatment have been discussed with the patient and family. After consideration of risks, benefits and other options for treatment, the patient has consented to  Procedure(s): TRANSMETATARSAL FOOT AMPUTATION (Right) as a surgical intervention .  The patient's history has been reviewed, patient examined, no change in status, stable for surgery.  I have reviewed the patient's chart and labs.  Questions were answered to the patient's satisfaction.     [name]

## 2016-07-24 NOTE — Anesthesia Postprocedure Evaluation (Signed)
Anesthesia Post Note  Patient: Joshua [name]  Procedure(s) Performed: Procedure(s) (LRB): TRANSMETATARSAL FOOT AMPUTATION (Right)  Patient location during evaluation: PACU Anesthesia Type: General Level of consciousness: awake and alert Pain management: pain level controlled Vital Signs Assessment: post-procedure vital signs reviewed and stable Respiratory status: spontaneous breathing, nonlabored ventilation, respiratory function stable and patient connected to nasal cannula oxygen Cardiovascular status: blood pressure returned to baseline and stable Postop Assessment: no signs of nausea or vomiting Anesthetic complications: no    Last Vitals:  Vitals:   07/24/16 1000 07/24/16 1023  BP: 140/79 (!) 142/80  Pulse:  89  Resp:  18  Temp:      Last Pain:  Vitals:   07/24/16 1040  TempSrc:   PainSc: Asleep                 Effie Berkshire

## 2016-07-24 NOTE — Progress Notes (Signed)
Subjective:    No complaints this afternoon.   Objective:   Temp:  [98.5 F (36.9 C)-99.1 F (37.3 C)] 98.8 F (37.1 C) (08/12 1048) Pulse Rate:  [86-96] 89 (08/12 1109) Resp:  [16-20] 16 (08/12 1048) BP: (123-142)/(55-80) 139/76 (08/12 1109) SpO2:  [92 %-99 %] 92 % (08/12 1109) Last BM Date: 07/22/16  Filed Weights   07/18/16 1620 07/18/16 2305 07/22/16 0553  Weight: (!) 317 lb (143.8 kg) (!) 317 lb 7.4 oz (144 kg) (!) 319 lb 1.6 oz (144.7 kg)    Intake/Output Summary (Last 24 hours) at 07/24/16 1258 Last data filed at 07/24/16 0944  Gross per 24 hour  Intake          1681.25 ml  Output              710 ml  Net           971.25 ml     Exam:  General: NAD  HEENT: sclera clear, throat clear  Resp: CTAB  Cardiac: RRR, no m/r/g, no jvd  GI: abdomen soft, NT, ND  MSK: no LE edema  Neuro: no focal deficits  Psych: appropriate affect  Lab Results:  Basic Metabolic Panel:  Recent Labs Lab 07/21/16 1148 07/22/16 0445 07/23/16 0445  NA 134* 136 136  K 3.7 3.6 3.7  CL 105 105 104  CO2 21* 22 20*  GLUCOSE 152* 156* 175*  BUN $Re'17 19 17  'RqD$ CREATININE 1.61* 1.52* 1.51*  CALCIUM 8.9 8.6* 8.6*    Liver Function Tests: No results for input(s): AST, ALT, ALKPHOS, BILITOT, PROT, ALBUMIN in the last 168 hours.  CBC:  Recent Labs Lab 07/22/16 0445 07/23/16 0249 07/24/16 0359  WBC 10.8* 10.3 7.7  HGB 11.1* 10.8* 9.8*  HCT 35.1* 33.9* 31.0*  MCV 88.0 86.7 86.1  PLT 150 183 189    Cardiac Enzymes:  Recent Labs Lab 07/18/16 2357 07/19/16 0508 07/19/16 1253  TROPONINI 0.24* 0.18* 0.16*    BNP: No results for input(s): PROBNP in the last 8760 hours.  Coagulation:  Recent Labs Lab 07/19/16 1105  INR 1.00    ECG:   Medications:   Scheduled Medications: . allopurinol  300 mg Oral Daily  . amLODipine  10 mg Oral Daily  . antiseptic oral rinse  7 mL Mouth Rinse BID  . aspirin  81 mg Oral Daily  . ceFAZolin      . ceFAZolin      .  cefTRIAXone (ROCEPHIN)  IV  2 g Intravenous Q24H  . DAPTOmycin (CUBICIN)  IV  1,000 mg Intravenous Q24H  . doxazosin  8 mg Oral Daily  . furosemide  40 mg Oral Daily  . hydrALAZINE  37.5 mg Oral BID  . HYDROmorphone      . HYDROmorphone      . insulin aspart  0-20 Units Subcutaneous TID WC  . insulin NPH Human  20 Units Subcutaneous QHS   And  . insulin NPH Human  20 Units Subcutaneous QAC breakfast  . isosorbide mononitrate  60 mg Oral Daily  . metoprolol succinate  50 mg Oral Daily  . pantoprazole  40 mg Oral Daily  . sodium chloride flush  3 mL Intravenous Q12H  . ticagrelor  90 mg Oral BID     Infusions: . sodium chloride 10 mL (07/24/16 1118)  . [START ON 07/25/2016] heparin    . nitroGLYCERIN 15 mcg/min (07/24/16 0938)     PRN Medications:  sodium chloride, acetaminophen **OR**  acetaminophen, acetaminophen, HYDROmorphone (DILAUDID) injection, methocarbamol **OR** methocarbamol (ROBAXIN)  IV, metoCLOPramide **OR** metoCLOPramide (REGLAN) injection, nitroGLYCERIN, ondansetron (ZOFRAN) IV, ondansetron **OR** ondansetron (ZOFRAN) IV, sodium chloride flush     Assessment/Plan    1. Chest pain/CAD - concern for unstable anginal, cath with 3 vessel disease. Patiend refused CABG, plans for PCI intervention. Procedure put on hold due to ongoing foot infection - medical therapy with ASA, imdur, Toprol, brillinta. No ACE given poor renal function.   - discuss with interventional Monday whether or not to pursue PCI at that time.    2. Chronic combined systolic/diastolic HF - negative 1 liter yesterday, negative 2.4 liters since admission - currently on oral lasix $RemoveBe'40mg'[id number]$  daily.   3. Foot wound - osteomyelitis abscess of the MTP joint and great toe - s/p transmetatarsal amputation earlier today - appreciate assistance of internal medicine, ID, and ortho. Defer management to these teams.        Carlyle Dolly, M.D.

## 2016-07-24 NOTE — Transfer of Care (Signed)
Immediate Anesthesia Transfer of Care Note  Patient: Joshua [name]  Procedure(s) Performed: Procedure(s): TRANSMETATARSAL FOOT AMPUTATION (Right)  Patient Location: PACU  Anesthesia Type:General  Level of Consciousness: awake and alert   Airway & Oxygen Therapy: Patient Spontanous Breathing and Patient connected to nasal cannula oxygen  Post-op Assessment: Report given to RN and Post -op Vital signs reviewed and stable  Post vital signs: Reviewed and stable  Last Vitals:  Vitals:   07/24/16 0444 07/24/16 0953  BP: (!) 123/55   Pulse: 91   Resp: 20   Temp: 36.9 C 37.1 C    Last Pain:  Vitals:   07/24/16 0444  TempSrc: Oral  PainSc:       Patients Stated Pain Goal: 2 (81/77/11 6579)  Complications: No apparent anesthesia complications

## 2016-07-24 NOTE — H&P (View-Only) (Signed)
ORTHOPAEDIC CONSULTATION  REQUESTING PHYSICIAN: [name], MD  Chief Complaint: Osteomyelitis abscess right great toe  HPI: Joshua [name] is a 60 y.o. male who presents with purulent drainage from the abscess right great toe. Patient states he's been going to the wound center for debridement of the ulcer and presents with acute purulent drainage at this time.  Past Medical History:  Diagnosis Date  . Chronic combined systolic and diastolic CHF (congestive heart failure) ([place])    a. 07/2015 Echo: EF 45-50%, mod LVH, mid apical/antsept AK, Gr 1 DD.  [name] Coronary artery disease    a. 2012 s/p MI/PCI: s/p DES to mid/dist RCA and overlapping DES to LAD;  b. 07/2015 Cath: LAD 10% ISR, D1 40(jailed), LCX 93m, OM2 30, OM3 30, RCA 43m ISR, 10d ISR.  . Diabetic gastroparesis ([place])   . Essential hypertension   . GERD (gastroesophageal reflux disease)   . Gout   . Heart murmur   . Hyperlipemia   . Morbid obesity ([place])   . Myocardial infarction ([place]) 2012  . OSA on CPAP   . Polyneuropathy in diabetes(357.2)   . Pulmonary sarcoidosis ([place])    "problems w/it years ago; no problems anymore" (07/03/2014)  . Type II diabetes mellitus ([place])    Past Surgical History:  Procedure Laterality Date  . BREAST LUMPECTOMY Right ~ 2000   "benign"  . CARDIAC CATHETERIZATION N/A 07/30/2015   Procedure: Left Heart Cath and Coronary Angiography;  Surgeon: Burnell Blanks, MD;  Location: [place] [facility];  Service: Cardiovascular;  Laterality: N/A;  . CARDIAC CATHETERIZATION N/A 07/19/2016   Procedure: Left Heart Cath and Coronary Angiography;  Surgeon: Belva Crome, MD;  Location: [place] [facility];  Service: Cardiovascular;  Laterality: N/A;  . CATARACT EXTRACTION W/ INTRAOCULAR LENS  IMPLANT, BILATERAL Bilateral 2014-2015  . CORONARY ANGIOPLASTY WITH STENT PLACEMENT  10/31/2011   30 % MID ATRIOVENTRICULAR GROOVE CX STENOSIS. 90 % MID RCA.PTA TO LAD/STENTING OF TANDEM 60-70%. LAD STENOSIS 99%  REDUCED TO 0%.3.0 X 32MM PROMUS DES POSTDILATED TO 3.25MM.  [name] CORONARY ANGIOPLASTY WITH STENT PLACEMENT  07/03/2014   "2"  . I&D EXTREMITY Right 10/30/2013   Procedure: IRRIGATION AND DEBRIDEMENT RIGHT SMALL FINGER POSSIBLE SECONDARY WOUND CLOSURE;  Surgeon: [name], MD;  Location: [facility];  Service: Orthopedics;  Laterality: Right;  [name] is available after 4pm  . I&D EXTREMITY Right 11/01/2013   Procedure:  IRRIGATION AND DEBRIDEMENT RIGHT  PROXIMAL PHALANGEAL LEVEL AMPUTATION;  Surgeon: [name], MD;  Location: [facility];  Service: Orthopedics;  Laterality: Right;  . INCISION AND DRAINAGE Right 10/28/2013   Procedure: INCISION AND DRAINAGE Right small finger;  Surgeon: [name], MD;  Location: [facility];  Service: Orthopedics;  Laterality: Right;  . LEFT HEART CATH Left 11/03/2011   Procedure: LEFT HEART CATH;  Surgeon: [name], MD;  Location: [facility] C[facility];  Service: Cardiovascular;  Laterality: Left;  . LEFT HEART CATHETERIZATION WITH CORONARY ANGIOGRAM N/A 07/03/2014   Procedure: LEFT HEART CATHETERIZATION WITH CORONARY ANGIOGRAM;  Surgeon: [name], MD;  Location: [facility] CATH LAB;  Service: Cardiovascular;  Laterality: N/A;  . PERCUTANEOUS CORONARY STENT INTERVENTION (PCI-S) Left 11/03/2011   Procedure: PERCUTANEOUS CORONARY STENT INTERVENTION (PCI-S);  Surgeon: [name], MD;  Location: [facility] C[facility];  Service: Cardiovascular;  Laterality: Left;  . TOE AMPUTATION Right ~ 2010   "2nd toe"  . TOE AMPUTATION Right 2012   "3rd toe"  Social History   Social History  . Marital status: Married    Spouse name: N/A  . Number of children: N/A  . Years of education: N/A   Social History Main Topics  . Smoking status: Never Smoker  . Smokeless tobacco: Never Used  . Alcohol use Yes     Comment: Pt sts he binge drinks on the weekends  . Drug use: No  . Sexual activity: Yes   Other Topics Concern  . None   Social History Narrative  . None    Family History  Problem Relation Age of Onset  . Heart attack Maternal Aunt   . Diabetes Maternal Grandmother   . Hypertension Maternal Grandmother    - negative except otherwise stated in the family history section Not on File Prior to Admission medications   Medication Sig Start Date End Date Taking? Authorizing Provider  allopurinol (ZYLOPRIM) 300 MG tablet Take 300 mg by mouth daily.    Yes Historical Provider, MD  amLODipine-olmesartan (AZOR) 10-40 MG tablet Take 1 tablet by mouth daily. 03/10/16  Yes [name], MD  aspirin EC 81 MG tablet Take 81 mg by mouth daily.   Yes Historical Provider, MD  doxazosin (CARDURA) 8 MG tablet Take 8 mg by mouth daily.  03/21/15  Yes Historical Provider, MD  furosemide (LASIX) 40 MG tablet TAKE 1 TABLET (40 MG TOTAL) BY MOUTH DAILY. 05/18/16  Yes [name], MD  hydrALAZINE (APRESOLINE) 25 MG tablet Take 1.5 tablets (37.5 mg total) by mouth 2 (two) times daily. Patient taking differently: Take 25 mg by mouth 2 (two) times daily.  02/16/16  Yes [name], MD  isosorbide mononitrate (IMDUR) 60 MG 24 hr tablet Take 1 tablet (60 mg total) by mouth daily. 08/08/15  Yes Rogelia Mire, NP  nitroGLYCERIN (NITROSTAT) 0.4 MG SL tablet Place 1 tablet (0.4 mg total) under the tongue every 5 (five) minutes as needed for chest pain. 10/19/13  Yes [name], MD  NOVOLIN N RELION 100 UNIT/ML injection Inject 50-60 Units into the skin See admin instructions. 50 units in the morning and 60 units in the evening 06/25/16  Yes Historical Provider, MD  NOVOLIN R RELION 100 UNIT/ML injection Inject 0-10 Units into the skin 3 (three) times daily before meals. Per sliding scale 06/25/16  Yes Historical Provider, MD  pantoprazole (PROTONIX) 40 MG tablet Take 1 tablet (40 mg total) by mouth daily. 09/02/15  Yes [name], MD  rosuvastatin (CRESTOR) 40 MG tablet Take 1 tablet (40 mg total) by mouth daily. 11/02/13  Yes Janece Canterbury, MD  ticagrelor  (BRILINTA) 90 MG TABS tablet Take 90 mg by mouth 2 (two) times daily.   Yes Historical Provider, MD  metoprolol succinate (TOPROL-XL) 100 MG 24 hr tablet Take 1 tablet (100 mg total) by mouth daily. Take with or immediately following a meal. Patient not taking: Reported on 07/18/2016 10/09/13   [name], MD  ticagrelor (BRILINTA) 60 MG TABS tablet Take 1 tablet (60 mg total) by mouth 2 (two) times daily. Patient not taking: Reported on 07/18/2016 02/16/16   [name], MD   Mr Foot Right Wo Contrast  Result Date: 07/23/2016 CLINICAL DATA:  Patient reports of bleeding wound of the right great toe and fluctuance on the bottom of the right foot. Question abscess or osteomyelitis. EXAM: MRI OF THE RIGHT FOREFOOT WITHOUT CONTRAST TECHNIQUE: Multiplanar, multisequence MR imaging was performed. No intravenous contrast was administered. COMPARISON:  MRI  right foot 04/11/2016. Plain films the right foot 01/23/2016 and 07/16/2016. FINDINGS: There is edema and corresponding decreased T1 signal throughout the distal phalanx of the great toe consistent with osteomyelitis. Small focus of marrow edema is seen in the head of the proximal phalanx of the great toe. The patient is status post amputation of the second toe, third toe and distal third metatarsal as seen on prior exams. Moderate first MTP osteoarthritis is identified. Mixed signal intensity fluid most consistent with abscess is seen in soft tissues of the pad of the great toe deep to the distal phalanx. The collection measures approximately 0.8 cm long by 1 cm craniocaudal by 0.4 cm transverse. Diffuse subcutaneous edema is present about the foot. There appears to be bandaging at the head of the proximal phalanx of the great toe on the medial side. Intrinsic musculature of the foot is atrophied. IMPRESSION: Findings consistent with osteomyelitis throughout the distal phalanx of the great toe with an abscess in the plantar soft tissues at the level of the  distal phalanx. Mild marrow edema in the head of the proximal phalanx of the great toe may be reactive or could reflect small focus of osteomyelitis. Soft tissue edema throughout the dorsum of the foot consistent with cellulitis or dependent change. First MTP osteoarthritis. Status post amputation of the second and third toes and distal third metatarsal. Electronically Signed   By: Inge Rise M.D.   On: 07/23/2016 13:07   - pertinent xrays, CT, MRI studies were reviewed and independently interpreted  Positive ROS: All other systems have been reviewed and were otherwise negative with the exception of those mentioned in the HPI and as above.  Physical Exam: General: Alert, no acute distress Psychiatric: Patient is competent for consent with normal mood and affect Lymphatic: No axillary or cervical lymphadenopathy Cardiovascular: Patient has massive edema of the right lower extremity. Respiratory: No cyanosis, no use of accessory musculature GI: No organomegaly, abdomen is soft and non-tender  Skin: Purulent drainage abscess that extends down to the MTP joint of the right great toe.   Neurologic: Patient does not have protective sensation bilateral lower extremities.   MUSCULOSKELETAL:  On examination patient does not have a palpable pulse due to the swelling. The MRI scan shows osteomyelitis involving the MTP joint of the right great toe he is status post amputations of the second and third toes.  Assessment: Assessment: Diabetic insensate neuropathy with venous stasis swelling and abscess osteomyelitis of the right great toe status post amputations of the second and third toe.  Plan: Plan: We will plan for a transmetatarsal amputation. Risks and benefits were discussed including persistent infection. Patient states he understands wishes to proceed at this time plan for surgery tomorrow anticipate patient can proceed with his cardiac angioplasty once we have resolve this  infection.  Thank you for the consult and the opportunity to see Mr. Ripley Bogosian, [place] [phone] 4:18 PM

## 2016-07-24 NOTE — Anesthesia Preprocedure Evaluation (Incomplete Revision)
Anesthesia Evaluation  Patient identified by MRN, date of birth, ID band Patient awake    Reviewed: Allergy & Precautions, NPO status , Patient's Chart, lab work & pertinent test results  Airway Mallampati: III  TM Distance: >3 FB Neck ROM: Full    Dental  (+) Teeth Intact, Dental Advisory Given   Pulmonary sleep apnea and Continuous Positive Airway Pressure Ventilation ,    breath sounds clear to auscultation       Cardiovascular + CAD, + Past MI, + Cardiac Stents and +CHF   Rhythm:Regular Rate:Normal     Neuro/Psych  Neuromuscular disease negative psych ROS   GI/Hepatic Neg liver ROS, GERD  ,  Endo/Other  diabetes  Renal/GU Renal disease  negative genitourinary   Musculoskeletal negative musculoskeletal ROS (+)   Abdominal   Peds negative pediatric ROS (+)  Hematology negative hematology ROS (+)   Anesthesia Other Findings   Reproductive/Obstetrics negative OB ROS                             2016 Echo  - Left ventricle: The cavity size was normal. Wall thickness was   increased in a pattern of moderate LVH. Systolic function was   mildly reduced. The estimated ejection fraction was in the range   of 45% to 50%. There is akinesis of the mid-apicalanteroseptal   myocardium. Doppler parameters are consistent with abnormal left   ventricular relaxation (grade 1 diastolic dysfunction).   Anesthesia Physical Anesthesia Plan  ASA: IV  Anesthesia Plan: General   Post-op Pain Management:    Induction: Intravenous  Airway Management Planned: Oral ETT  Additional Equipment:   Intra-op Plan:   Post-operative Plan: Extubation in OR  Informed Consent: I have reviewed the patients History and Physical, chart, labs and discussed the procedure including the risks, benefits and alternatives for the proposed anesthesia with the patient or authorized representative who has indicated  his/her understanding and acceptance.   Dental advisory given  Plan Discussed with: CRNA  Anesthesia Plan Comments: (- Discussed risk of GA with patient in detail including risk of death. He declines nerve block and sedation for procedure. His cardiologist will not perform PCI or cardiac stent placement until his infected toes are amputated per note. Per patient Dr. Servando Snare does think he is a candidate for CABG. Pt would like to proceed with knowledge of the potential risks. He currently denies active chest pain.    - Anesthetic plan discussed in detail. Associated risk discussed including but not limited to life threatening cardiovascular, pulmonary events and dental damage. The postoperative pain management and antiemetic plan discussed with patient. All questions answered in detail. Patient is in agreement.   )       Anesthesia Quick Evaluation

## 2016-07-24 NOTE — Progress Notes (Signed)
Sharol Given MD Notifed about pt heparin being off. Md recommended heparin be continued tomorrow. Will continue to monitor pt.

## 2016-07-24 NOTE — Op Note (Signed)
     Date of Surgery: 07/24/2016  INDICATIONS: Joshua [name] is a 60 y.o.-year-old male who has previously amputations of the second and third toes who has osteomyelitis abscess of the MTP joint and great toe and presents at this time for transmetatarsal amputation. Patient also has severe cardiac disease and is awaiting surgery for the foot to proceed with the cardiac surgery.[name]  PREOPERATIVE DIAGNOSIS: Abscess osteomyelitis MTP joint right great toe status post amputation of the second and third toes  POSTOPERATIVE DIAGNOSIS: Same.  PROCEDURE: Transmetatarsal amputation Application of Prevena wound VAC  SURGEON: Sharol Given, M.D.  ANESTHESIA:  general  IV FLUIDS AND URINE: See anesthesia.  ESTIMATED BLOOD LOSS: Minimal mL.  COMPLICATIONS: None.  DESCRIPTION OF PROCEDURE: The patient was brought to the operating room and underwent a general anesthetic. After adequate levels of anesthesia were obtained patient's lower extremity was prepped using DuraPrep draped into a sterile field. A timeout was called.  A fishmouth incision was made just proximal to the ulcerative nonviable tissue. This was carried sharply down to bone. A oscillating saw was used to perform a transmetatarsal amputation with a gentle cascade of the metatarsals and beveled plantarly. Electrocautery was used for hemostasis. The wound was irrigated with normal saline. The incision was closed using 2-0 nylon. A Prevena wound VAC was applied. This had a good suction fit. Patient was taken to the PACU in stable condition.  Joshua Score, MD [place] 9:49 AM

## 2016-07-24 NOTE — Anesthesia Preprocedure Evaluation (Addendum)
Anesthesia Evaluation  Patient identified by MRN, date of birth, ID band Patient awake    Reviewed: Allergy & Precautions, NPO status , Patient's Chart, lab work & pertinent test results  Airway Mallampati: III  TM Distance: >3 FB Neck ROM: Full    Dental  (+) Teeth Intact, Dental Advisory Given   Pulmonary sleep apnea and Continuous Positive Airway Pressure Ventilation ,    breath sounds clear to auscultation       Cardiovascular + CAD, + Past MI, + Cardiac Stents and +CHF   Rhythm:Regular Rate:Normal     Neuro/Psych  Neuromuscular disease negative psych ROS   GI/Hepatic Neg liver ROS, GERD  ,  Endo/Other  diabetes  Renal/GU Renal disease  negative genitourinary   Musculoskeletal negative musculoskeletal ROS (+)   Abdominal   Peds negative pediatric ROS (+)  Hematology negative hematology ROS (+)   Anesthesia Other Findings   Reproductive/Obstetrics negative OB ROS                             2016 Echo  - Left ventricle: The cavity size was normal. Wall thickness was   increased in a pattern of moderate LVH. Systolic function was   mildly reduced. The estimated ejection fraction was in the range   of 45% to 50%. There is akinesis of the mid-apicalanteroseptal   myocardium. Doppler parameters are consistent with abnormal left   ventricular relaxation (grade 1 diastolic dysfunction).   Anesthesia Physical Anesthesia Plan  ASA: IV  Anesthesia Plan: General   Post-op Pain Management:    Induction: Intravenous  Airway Management Planned: Oral ETT  Additional Equipment:   Intra-op Plan:   Post-operative Plan: Extubation in OR  Informed Consent: I have reviewed the patients History and Physical, chart, labs and discussed the procedure including the risks, benefits and alternatives for the proposed anesthesia with the patient or authorized representative who has indicated  his/her understanding and acceptance.   Dental advisory given  Plan Discussed with: CRNA  Anesthesia Plan Comments: (- Discussed risk of GA with patient in detail including risk of death. He declines nerve block and sedation for procedure. His cardiologist will not perform PCI or cardiac stent placement until his infected toes are amputated per note. Per patient Dr. Servando Snare does think he is a candidate for CABG. Pt would like to proceed with knowledge of the potential risks. He currently denies active chest pain.    - Anesthetic plan discussed in detail. Associated risk discussed including but not limited to life threatening cardiovascular, pulmonary events and dental damage. The postoperative pain management and antiemetic plan discussed with patient. All questions answered in detail. Patient is in agreement.   )       Anesthesia Quick Evaluation

## 2016-07-24 NOTE — Progress Notes (Signed)
Subjective: No new complaints   Antibiotics:  Anti-infectives    Start     Dose/Rate Route Frequency Ordered Stop   07/24/16 1400  DAPTOmycin (CUBICIN) 1,000 mg in sodium chloride 0.9 % IVPB     1,000 mg 240 mL/hr over 30 Minutes Intravenous Every 24 hours 07/24/16 1232     07/24/16 1230  cefTRIAXone (ROCEPHIN) 2 g in dextrose 5 % 50 mL IVPB     2 g 100 mL/hr over 30 Minutes Intravenous Every 24 hours 07/24/16 1219     07/24/16 0900  ceFAZolin (ANCEF) 3 g in dextrose 5 % 50 mL IVPB  Status:  Discontinued    Comments:  CAN YOU PLEASE NOT GIVE THIS PRE-OP BUT PERIOPERATIVELY SO WE CAN GET SOME INTRA-OPERATIVE CULTURES? KEES VAN DAM   3 g 130 mL/hr over 30 Minutes Intravenous To [facility] 07/23/16 1918 07/24/16 0826   07/24/16 0849  ceFAZolin (ANCEF) 1 GM/50ML IVPB    Comments:  Marinda Elk   : cabinet override      07/24/16 0849 07/24/16 2059   07/24/16 0849  ceFAZolin (ANCEF) 2-4 GM/100ML-% IVPB    Comments:  Marinda Elk   : cabinet override      07/24/16 0849 07/24/16 2059   07/24/16 0600  ceFAZolin (ANCEF) 3 g in dextrose 5 % 50 mL IVPB  Status:  Discontinued     3 g 130 mL/hr over 30 Minutes Intravenous On call to O.R. 07/23/16 1749 07/23/16 1918      Medications: Scheduled Meds: . allopurinol  300 mg Oral Daily  . amLODipine  10 mg Oral Daily  . antiseptic oral rinse  7 mL Mouth Rinse BID  . aspirin  81 mg Oral Daily  . ceFAZolin      . ceFAZolin      . cefTRIAXone (ROCEPHIN)  IV  2 g Intravenous Q24H  . DAPTOmycin (CUBICIN)  IV  1,000 mg Intravenous Q24H  . doxazosin  8 mg Oral Daily  . furosemide  40 mg Oral Daily  . hydrALAZINE  37.5 mg Oral BID  . HYDROmorphone      . HYDROmorphone      . insulin aspart  0-20 Units Subcutaneous TID WC  . insulin NPH Human  20 Units Subcutaneous QHS   And  . insulin NPH Human  20 Units Subcutaneous QAC breakfast  . isosorbide mononitrate  60 mg Oral Daily  . metoprolol succinate  50 mg Oral Daily  .  pantoprazole  40 mg Oral Daily  . sodium chloride flush  3 mL Intravenous Q12H  . ticagrelor  90 mg Oral BID   Continuous Infusions: . sodium chloride 10 mL (07/24/16 1118)  . [START ON 07/25/2016] heparin    . nitroGLYCERIN 15 mcg/min (07/24/16 0938)   PRN Meds:.sodium chloride, acetaminophen **OR** acetaminophen, acetaminophen, HYDROmorphone (DILAUDID) injection, methocarbamol **OR** methocarbamol (ROBAXIN)  IV, metoCLOPramide **OR** metoCLOPramide (REGLAN) injection, nitroGLYCERIN, ondansetron (ZOFRAN) IV, ondansetron **OR** ondansetron (ZOFRAN) IV, sodium chloride flush    Objective: Weight change:   Intake/Output Summary (Last 24 hours) at 07/24/16 1335 Last data filed at 07/24/16 0944  Gross per 24 hour  Intake          1681.25 ml  Output              710 ml  Net           971.25 ml   Blood pressure 139/76, pulse 89, temperature 98.8 F (37.1 C), resp.  rate 16, height $RemoveBe'5\' 9"'ubsJOOubj$  (1.753 m), weight (!) 319 lb 1.6 oz (144.7 kg), SpO2 92 %. Temp:  [98.5 F (36.9 C)-99.1 F (37.3 C)] 98.8 F (37.1 C) (08/12 1048) Pulse Rate:  [86-96] 89 (08/12 1109) Resp:  [16-20] 16 (08/12 1048) BP: (123-142)/(55-80) 139/76 (08/12 1109) SpO2:  [92 %-99 %] 92 % (08/12 1109)  Physical Exam:   General: Alert and awake, oriented x3, not in any acute distress, obese HEENT: anicteric sclera,  EOMI, oropharynx clear and without exudate Cardiovascular: regular rate, normal r,  no murmur rubs or gallops Pulmonary: clear to auscultation bilaterally, no wheezing, Gastrointestinal: soft nontender, nondistended, Musculoskeletal: Skin, soft tissue: foot is bandaged  CBC:  CBC Latest Ref Rng & Units 07/24/2016 07/23/2016 07/22/2016  WBC 4.0 - 10.5 K/uL 7.7 10.3 10.8(H)  Hemoglobin 13.0 - 17.0 g/dL 9.8(L) 10.8(L) 11.1(L)  Hematocrit 39.0 - 52.0 % 31.0(L) 33.9(L) 35.1(L)  Platelets 150 - 400 K/uL 189 183 150     BMET  Recent Labs  07/22/16 0445 07/23/16 0445  NA 136 136  K 3.6 3.7  CL 105 104    CO2 22 20*  GLUCOSE 156* 175*  BUN 19 17  CREATININE 1.52* 1.51*  CALCIUM 8.6* 8.6*     Liver Panel  No results for input(s): PROT, ALBUMIN, AST, ALT, ALKPHOS, BILITOT, BILIDIR, IBILI in the last 72 hours.     Sedimentation Rate  Recent Labs  07/24/16 0359  ESRSEDRATE 131*   C-Reactive Protein  Recent Labs  07/23/16 1621  CRP 28.9*    Micro Results: Recent Results (from the past 720 hour(s))  Culture, blood (routine x 2)     Status: None (Preliminary result)   Collection Time: 07/21/16 10:40 PM  Result Value Ref Range Status   Specimen Description BLOOD LEFT ARM  Final   Special Requests BOTTLES DRAWN AEROBIC AND ANAEROBIC 10ML  Final   Culture NO GROWTH 2 DAYS  Final   Report Status PENDING  Incomplete  Culture, blood (routine x 2)     Status: None (Preliminary result)   Collection Time: 07/21/16 10:48 PM  Result Value Ref Range Status   Specimen Description BLOOD LEFT HAND  Final   Special Requests BOTTLES DRAWN AEROBIC AND ANAEROBIC 10ML  Final   Culture NO GROWTH 2 DAYS  Final   Report Status PENDING  Incomplete  Culture, Urine     Status: None   Collection Time: 07/22/16 12:33 AM  Result Value Ref Range Status   Specimen Description URINE, CLEAN CATCH  Final   Special Requests NONE  Final   Culture NO GROWTH  Final   Report Status 07/23/2016 FINAL  Final  Surgical PCR screen     Status: None   Collection Time: 07/23/16  8:11 PM  Result Value Ref Range Status   MRSA, PCR NEGATIVE NEGATIVE Final   Staphylococcus aureus NEGATIVE NEGATIVE Final    Comment:        The Xpert SA Assay (FDA approved for NASAL specimens in patients over 35 years of age), is one component of a comprehensive surveillance program.  Test performance has been validated by [facility] for patients greater than or equal to 65 year old. It is not intended to diagnose infection nor to guide or monitor treatment.     Studies/Results: Mr Foot Right Wo Contrast  Result  Date: 07/23/2016 CLINICAL DATA:  Patient reports of bleeding wound of the right great toe and fluctuance on the bottom of the right foot. Question abscess  or osteomyelitis. EXAM: MRI OF THE RIGHT FOREFOOT WITHOUT CONTRAST TECHNIQUE: Multiplanar, multisequence MR imaging was performed. No intravenous contrast was administered. COMPARISON:  MRI right foot 04/11/2016. Plain films the right foot 01/23/2016 and 07/16/2016. FINDINGS: There is edema and corresponding decreased T1 signal throughout the distal phalanx of the great toe consistent with osteomyelitis. Small focus of marrow edema is seen in the head of the proximal phalanx of the great toe. The patient is status post amputation of the second toe, third toe and distal third metatarsal as seen on prior exams. Moderate first MTP osteoarthritis is identified. Mixed signal intensity fluid most consistent with abscess is seen in soft tissues of the pad of the great toe deep to the distal phalanx. The collection measures approximately 0.8 cm long by 1 cm craniocaudal by 0.4 cm transverse. Diffuse subcutaneous edema is present about the foot. There appears to be bandaging at the head of the proximal phalanx of the great toe on the medial side. Intrinsic musculature of the foot is atrophied. IMPRESSION: Findings consistent with osteomyelitis throughout the distal phalanx of the great toe with an abscess in the plantar soft tissues at the level of the distal phalanx. Mild marrow edema in the head of the proximal phalanx of the great toe may be reactive or could reflect small focus of osteomyelitis. Soft tissue edema throughout the dorsum of the foot consistent with cellulitis or dependent change. First MTP osteoarthritis. Status post amputation of the second and third toes and distal third metatarsal. Electronically Signed   By: Inge Rise M.D.   On: 07/23/2016 13:07      Assessment/Plan:  INTERVAL HISTORY:   Patient taken to the operating room this morning  and is status post transmetatarsal imitation. Hopefully cultures were sent.     Principal Problem:   Unstable angina pectoris (HCC) Active Problems:   CAD in native artery   Chronic combined systolic and diastolic CHF (congestive heart failure) (HCC)   Coronary artery disease   Hyperlipemia   Renal insufficiency   Penetrating wound of right foot   Type 1 diabetes mellitus with right diabetic foot ulcer (HCC)   Chronic osteomyelitis of toe of right foot (HCC)   CKD (chronic kidney disease) stage 3, GFR 30-59 ml/min    Joshua [name] is a 60 y.o. male with  unstable angina with 3 V disease in need of PCI, with CKD, DM that is poorly controlled and with neuropathy, hx of prior amputations of 2 digits on same foot now with osteomyelitis in distal and possibly proximal phalanx of great toe and foot abscess now sp TMT amputation  #  #1 DFU with osteomyelitis and foot abscess  --hopefully cultures were sent, in the interim I am starting cubicin and rocephin --followup cultures --DM needs to be better controlled and he will need diligent wound care --I would give him at least 2 weeks of "mop up" antibiotics likely IV but we can see what cultures show   LOS: 5 days   Alcide Evener 07/24/2016, 1:35 PM

## 2016-07-24 NOTE — Progress Notes (Signed)
Initial Nutrition Assessment  DOCUMENTATION CODES:   Morbid obesity  INTERVENTION:    Advance diet as medically appropriate, add interventions accordingly  NUTRITION DIAGNOSIS:   Increased nutrient needs related to wound healing as evidenced by estimated needs  GOAL:   Patient will meet greater than or equal to 90% of their needs  MONITOR:   Diet advancement, PO intake, Labs, Weight trends, Skin, I & O's  REASON FOR ASSESSMENT:   Consult Wound healing  ASSESSMENT:   60 yo Male who has previously amputations of the second and third toes who has osteomyelitis abscess of the MTP joint and great toe and presents at this time for transmetatarsal amputation. Patient also has severe cardiac disease and is awaiting surgery for the foot to proceed with the cardiac surgery.  Patient s/p procedures 8/12: Transmetatarsal amputation Application of Prevena wound VAC  Pt currently in PACU. Prior to surgery, pt was on a Heart Healthy/Carbohydrate Modified diet. PO intake was good at mostly 100%. Will benefit from addition of liquid protein supplements once diet allows. RD unable to complete Nutrition Focused Physical Exam at this time.  Diet Order:   NPO  Skin:  Wound (see comment) (NPWT)  Last BM:  8/10  Height:   Ht Readings from Last 1 Encounters:  07/18/16 $RemoveB'5\' 9"'bRnAOmmV$  (1.753 m)    Weight:   Wt Readings from Last 1 Encounters:  07/22/16 (!) 319 lb 1.6 oz (144.7 kg)    Ideal Body Weight:  72.7 kg  BMI:  Body mass index is 47.12 kg/m.  Estimated Nutritional Needs:   Kcal:  9021-1155  Protein:  120-130 gm  Fluid:  2.3-2.5 L  EDUCATION NEEDS:   No education needs identified at this time  Arthur Holms, RD, LDN Pager #: [phone] After-Hours Pager #: [phone]

## 2016-07-24 NOTE — Progress Notes (Signed)
PROGRESS NOTE  Joshua [name] PIR:[id number] DOB: [birth year] DOA: 07/18/2016 PCP: [name], MD  HPI/Recap of past 61 hours: 60year old male with CAD s/p LAD stent admitted with unstable angina by cardiology service, plan for PCI held off due to right foot infection for which he has now had TM amputation on 8/12 with Dr. Sharol Given. ID and TRH were consulted as well. ID plans empiric antibiotics for a couple of weeks, and cardiology will decide this coming week on PCI, likely stenting as patient has declined CABG.  This AM pt was seen after surgery, has some mild right foot pain, wound vac in place, denies chest pain, dyspnea, etc.    Assessment/Plan: Principal Problem:   Unstable angina pectoris ([place]) and CAD in native artery - stable, no complaints, on telemetry, per cardiology team who are primary.   Chronic combined systolic and diastolic CHF (congestive heart failure) (HCC)   Coronary artery disease   Hyperlipemia   Renal insufficiency - follow daily, note he is on lasix and could consider holding this if PCI is planned   Penetrating wound of right foot - now s/p TM amputation with Dr. Sharol Given on 8/12, wound vac in place   Type 1 diabetes mellitus with right diabetic foot ulcer ([place])   Chronic osteomyelitis of toe of right foot ([place]) - s/p TM amputation, abx per ID   CKD (chronic kidney disease) stage 3, GFR 30-59 ml/min  Code Status: FULL   Family Communication: Present at bedside this AM.   Disposition Plan: Likely rehab vs home.  Consultants:  Primary team is Cardology  ID, TRH and Ortho are consulting.   Procedures:  Cardiac cath 8/7  TM amputation right foot 8/12   Antimicrobials:  Dapto 8/12  Rocephin 8/12    Objective: Vitals:   07/24/16 1000 07/24/16 1023 07/24/16 1048 07/24/16 1109  BP: 140/79 (!) 142/80 125/76 139/76  Pulse:  89 86 89  Resp:  18 16   Temp:   98.8 F (37.1 C)   TempSrc:      SpO2:  97% 99% 92%  Weight:      Height:         Intake/Output Summary (Last 24 hours) at 07/24/16 1345 Last data filed at 07/24/16 1300  Gross per 24 hour  Intake          1921.25 ml  Output              710 ml  Net          1211.25 ml   Filed Weights   07/18/16 1620 07/18/16 2305 07/22/16 0553  Weight: (!) 143.8 kg (317 lb) (!) 144 kg (317 lb 7.4 oz) (!) 144.7 kg (319 lb 1.6 oz)    Exam: General:  Alert, oriented, calm, in no acute distress Eyes: pupils round and reactive to light and accomodation, clear sclerea Neck: supple, no masses, trachea mildline  Cardiovascular: RRR, no murmurs or rubs, no peripheral edema  Respiratory: clear to auscultation bilaterally, no wheezes, no crackles  Abdomen: soft, nontender, nondistended, normal bowel tones heard  Skin: dry, no rashes  Musculoskeletal: no joint effusions, normal range of motion, R forefoot with vac Psychiatric: appropriate affect, normal speech  Neurologic: extraocular muscles intact, clear speech, moving all extremities with intact sensorium    Data Reviewed: CBC:  Recent Labs Lab 07/19/16 0508 07/21/16 0318 07/22/16 0445 07/23/16 0249 07/24/16 0359  WBC 8.2 10.0 10.8* 10.3 7.7  HGB 11.4* 11.0* 11.1* 10.8* 9.8*  HCT  36.4* 35.3* 35.1* 33.9* 31.0*  MCV 88.6 88.7 88.0 86.7 86.1  PLT 148* 142* 150 183 536   Basic Metabolic Panel:  Recent Labs Lab 07/19/16 0508 07/20/16 0410 07/21/16 1148 07/22/16 0445 07/23/16 0445  NA 140 139 134* 136 136  K 3.7 3.6 3.7 3.6 3.7  CL 109 108 105 105 104  CO2 25 23 21* 22 20*  GLUCOSE 179* 107* 152* 156* 175*  BUN $Re'15 18 17 19 17  'Dvz$ CREATININE 1.45* 1.62* 1.61* 1.52* 1.51*  CALCIUM 8.7* 8.5* 8.9 8.6* 8.6*   GFR: Estimated Creatinine Clearance: 76.6 mL/min (by C-G formula based on SCr of 1.51 mg/dL). Liver Function Tests: No results for input(s): AST, ALT, ALKPHOS, BILITOT, PROT, ALBUMIN in the last 168 hours. No results for input(s): LIPASE, AMYLASE in the last 168 hours. No results for input(s): AMMONIA in the  last 168 hours. Coagulation Profile:  Recent Labs Lab 07/19/16 1105  INR 1.00   Cardiac Enzymes:  Recent Labs Lab 07/18/16 2357 07/19/16 0508 07/19/16 1253  TROPONINI 0.24* 0.18* 0.16*   BNP (last 3 results) No results for input(s): PROBNP in the last 8760 hours. HbA1C: No results for input(s): HGBA1C in the last 72 hours. CBG:  Recent Labs Lab 07/23/16 2054 07/24/16 0608 07/24/16 0850 07/24/16 1004 07/24/16 1116  GLUCAP 151* 160* 159* 171* 171*   Lipid Profile: No results for input(s): CHOL, HDL, LDLCALC, TRIG, CHOLHDL, LDLDIRECT in the last 72 hours. Thyroid Function Tests: No results for input(s): TSH, T4TOTAL, FREET4, T3FREE, THYROIDAB in the last 72 hours. Anemia Panel: No results for input(s): VITAMINB12, FOLATE, FERRITIN, TIBC, IRON, RETICCTPCT in the last 72 hours. Urine analysis:    Component Value Date/Time   COLORURINE YELLOW 07/22/2016 0033   APPEARANCEUR CLOUDY (A) 07/22/2016 0033   LABSPEC 1.021 07/22/2016 0033   PHURINE 5.0 07/22/2016 0033   GLUCOSEU NEGATIVE 07/22/2016 0033   HGBUR SMALL (A) 07/22/2016 0033   BILIRUBINUR NEGATIVE 07/22/2016 0033   KETONESUR NEGATIVE 07/22/2016 0033   PROTEINUR >300 (A) 07/22/2016 0033   UROBILINOGEN 0.2 11/01/2011 1410   NITRITE NEGATIVE 07/22/2016 0033   LEUKOCYTESUR NEGATIVE 07/22/2016 0033   Sepsis Labs: $RemoveBefo'@[email or web address]'NotHWvSHfuR$ (procalcitonin:4,lacticidven:4)  ) Recent Results (from the past 240 hour(s))  Culture, blood (routine x 2)     Status: None (Preliminary result)   Collection Time: 07/21/16 10:40 PM  Result Value Ref Range Status   Specimen Description BLOOD LEFT ARM  Final   Special Requests BOTTLES DRAWN AEROBIC AND ANAEROBIC 10ML  Final   Culture NO GROWTH 2 DAYS  Final   Report Status PENDING  Incomplete  Culture, blood (routine x 2)     Status: None (Preliminary result)   Collection Time: 07/21/16 10:48 PM  Result Value Ref Range Status   Specimen Description BLOOD LEFT HAND  Final   Special  Requests BOTTLES DRAWN AEROBIC AND ANAEROBIC 10ML  Final   Culture NO GROWTH 2 DAYS  Final   Report Status PENDING  Incomplete  Culture, Urine     Status: None   Collection Time: 07/22/16 12:33 AM  Result Value Ref Range Status   Specimen Description URINE, CLEAN CATCH  Final   Special Requests NONE  Final   Culture NO GROWTH  Final   Report Status 07/23/2016 FINAL  Final  Surgical PCR screen     Status: None   Collection Time: 07/23/16  8:11 PM  Result Value Ref Range Status   MRSA, PCR NEGATIVE NEGATIVE Final   Staphylococcus aureus NEGATIVE NEGATIVE Final  Comment:        The Xpert SA Assay (FDA approved for NASAL specimens in patients over 41 years of age), is one component of a comprehensive surveillance program.  Test performance has been validated by [facility] for patients greater than or equal to 20 year old. It is not intended to diagnose infection nor to guide or monitor treatment.       Studies: No results found.  Scheduled Meds: . allopurinol  300 mg Oral Daily  . amLODipine  10 mg Oral Daily  . antiseptic oral rinse  7 mL Mouth Rinse BID  . aspirin  81 mg Oral Daily  . ceFAZolin      . ceFAZolin      . cefTRIAXone (ROCEPHIN)  IV  2 g Intravenous Q24H  . DAPTOmycin (CUBICIN)  IV  1,000 mg Intravenous Q24H  . doxazosin  8 mg Oral Daily  . furosemide  40 mg Oral Daily  . hydrALAZINE  37.5 mg Oral BID  . HYDROmorphone      . HYDROmorphone      . insulin aspart  0-20 Units Subcutaneous TID WC  . insulin NPH Human  22 Units Subcutaneous QHS   And  . [START ON 07/25/2016] insulin NPH Human  20 Units Subcutaneous QAC breakfast  . isosorbide mononitrate  60 mg Oral Daily  . metoprolol succinate  50 mg Oral Daily  . pantoprazole  40 mg Oral Daily  . sodium chloride flush  3 mL Intravenous Q12H  . ticagrelor  90 mg Oral BID    Continuous Infusions: . sodium chloride 10 mL (07/24/16 1118)  . [START ON 07/25/2016] heparin    . nitroGLYCERIN 15 mcg/min  (07/24/16 0938)     LOS: 5 days   Time spent: 23 min  Jalaine Riggenbach Marry Guan, MD [facility] Pager [phone]  If 7PM-7AM, please contact night-coverage [email or web address] Password [email or web address] 07/24/2016, 1:45 PM

## 2016-07-25 LAB — CBC
HCT: 31.6 % — ABNORMAL LOW (ref 39.0–52.0)
Hemoglobin: 9.8 g/dL — ABNORMAL LOW (ref 13.0–17.0)
MCH: 27.4 pg (ref 26.0–34.0)
MCHC: 31 g/dL (ref 30.0–36.0)
MCV: 88.3 fL (ref 78.0–100.0)
Platelets: 218 10*3/uL (ref 150–400)
RBC: 3.58 MIL/uL — ABNORMAL LOW (ref 4.22–5.81)
RDW: 14.7 % (ref 11.5–15.5)
WBC: 9.4 10*3/uL (ref 4.0–10.5)

## 2016-07-25 LAB — BASIC METABOLIC PANEL
Anion gap: 12 (ref 5–15)
BUN: 16 mg/dL (ref 6–20)
CO2: 22 mmol/L (ref 22–32)
Calcium: 8.7 mg/dL — ABNORMAL LOW (ref 8.9–10.3)
Chloride: 105 mmol/L (ref 101–111)
Creatinine, Ser: 1.55 mg/dL — ABNORMAL HIGH (ref 0.61–1.24)
GFR calc Af Amer: 56 mL/min — ABNORMAL LOW (ref >60)
GFR calc non Af Amer: 48 mL/min — ABNORMAL LOW (ref >60)
Glucose, Bld: 160 mg/dL — ABNORMAL HIGH (ref 65–99)
Potassium: 3.7 mmol/L (ref 3.5–5.1)
Sodium: 139 mmol/L (ref 135–145)

## 2016-07-25 LAB — GLUCOSE, CAPILLARY
Glucose-Capillary: 149 mg/dL — ABNORMAL HIGH (ref 65–99)
Glucose-Capillary: 182 mg/dL — ABNORMAL HIGH (ref 65–99)
Glucose-Capillary: 186 mg/dL — ABNORMAL HIGH (ref 65–99)
Glucose-Capillary: 174 mg/dL — ABNORMAL HIGH (ref 65–99)

## 2016-07-25 LAB — HEPARIN LEVEL (UNFRACTIONATED)
Heparin Unfractionated: <0.1 IU/mL — ABNORMAL LOW (ref 0.30–0.70)
Heparin Unfractionated: 0.28 IU/mL — ABNORMAL LOW (ref 0.30–0.70)

## 2016-07-25 MED ORDER — FUROSEMIDE 40 MG PO TABS
40.0000 mg | ORAL_TABLET | Freq: Every day | ORAL | Status: DC
  Administered 2016-07-25 – 2016-07-28 (×4): 40 mg via ORAL
  Filled 2016-07-25 (×4): qty 1

## 2016-07-25 MED ORDER — SODIUM CHLORIDE 0.9 % IV SOLN
600.0000 mg | Freq: Two times a day (BID) | INTRAVENOUS | Status: DC
Start: 2016-07-25 — End: 2016-08-03
  Administered 2016-07-25 – 2016-08-03 (×19): 600 mg via INTRAVENOUS
  Filled 2016-07-25 (×21): qty 600

## 2016-07-25 MED ORDER — ROSUVASTATIN CALCIUM 10 MG PO TABS
40.0000 mg | ORAL_TABLET | Freq: Every day | ORAL | Status: DC
  Administered 2016-07-25 – 2016-08-02 (×9): 40 mg via ORAL
  Filled 2016-07-25 (×2): qty 4
  Filled 2016-07-25: qty 2
  Filled 2016-07-25 (×7): qty 4

## 2016-07-25 NOTE — Evaluation (Signed)
Physical Therapy Evaluation Patient Details Name: Joshua [name] MRN: [id number] DOB: 09-Jan-1956 Today's Date: 07/25/2016   History of Present Illness  Patient is a 60 yo male admitted 07/18/16 with unstable angina.  Cath showed 3-vessel disease.  Patient declining CABG.  Plan for PCI cancelled - patient with Rt foot wound with drainage - osteomyelitis.  Patient s/p Rt transmetatarsal amputation 07/24/16.  PCI still pending.    PMH:  CAD, MI, cardiac stents, DM, neuropathy, gastroparesis, Rt foot recurrent ulcers, CHF, HTN, morbid obesity, OSA on CPAP    Clinical Impression  Patient presents with problems listed below.  Will benefit from acute PT to maximize functional mobility prior to discharge.  Patient independent pta.  Now post-op patient requiring +2 mod assist to take 1 step.  Patient also awaiting PCI - currently limited by DOE.  Recommend Inpatient Rehab consult to return patient to optimal functional level prior to return home with wife.    Follow Up Recommendations CIR;Supervision/Assistance - 24 hour    Equipment Recommendations  Rolling walker with 5" wheels;3in1 (PT);Wheelchair (measurements PT);Wheelchair cushion (measurements PT)    Recommendations for Other Services Rehab consult     Precautions / Restrictions Precautions Precautions: Fall Precaution Comments: Patient with DOE awaiting PCI.  New transmetatarsal amputation. Restrictions Weight Bearing Restrictions: Yes RLE Weight Bearing: Non weight bearing      Mobility  Bed Mobility Overal bed mobility: Needs Assistance Bed Mobility: Supine to Sit;Sit to Supine     Supine to sit: Min guard;HOB elevated Sit to supine: Min assist   General bed mobility comments: Verbal cues for technique.  Able to move to sitting with no physical assist, but use of rail and HOB elevated.  Patient with dyspnea 3/4 in sitting, improved with time.  Required assist to bring LE's onto bed to return to supine.  Transfers Overall  transfer level: Needs assistance Equipment used: Rolling walker (2 wheeled) Transfers: Sit to/from Stand Sit to Stand: Mod assist;+2 physical assistance         General transfer comment: Verbal cues for hand placement and for NWB on RLE.  Patient required mod +2 assist to rise to standing on LLE.  Assist for balance once upright.  Patient able to stand approx 60 seconds.  Patient instructed on taking step with RW.  Able to use UE's to take 1 short step forward and backward with LLE, NWB on RLE.  UE's shaking on RW - general weakness.  Patient with DOE of 3-4/4.  Returned to sitting/supine.  Ambulation/Gait             General Gait Details: NT  Stairs            Wheelchair Mobility    Modified Rankin (Stroke Patients Only)       Balance Overall balance assessment: Needs assistance Sitting-balance support: No upper extremity supported;Feet supported Sitting balance-Leahy Scale: Good     Standing balance support: Bilateral upper extremity supported Standing balance-Leahy Scale: Poor                               Pertinent Vitals/Pain Pain Assessment: 0-10 Pain Score: 2  Pain Location: Rt foot Pain Descriptors / Indicators: Sore Pain Intervention(s): Monitored during session;Repositioned    Home Living Family/patient expects to be discharged to:: Private residence Living Arrangements: Spouse/significant other;Children Available Help at Discharge: Family;Available 24 hours/day Type of Home: House Home Access: Level entry     Home Layout:  One level Home Equipment: Crutches      Prior Function Level of Independence: Independent         Comments: Drives.  Recently laid off.     Hand Dominance        Extremity/Trunk Assessment   Upper Extremity Assessment: Generalized weakness           Lower Extremity Assessment: RLE deficits/detail;LLE deficits/detail RLE Deficits / Details: Transmetatarsal amputation with VAC in place.  Edema  noted. LLE Deficits / Details: Noted edema     Communication   Communication: No difficulties  Cognition Arousal/Alertness: Awake/alert Behavior During Therapy: WFL for tasks assessed/performed Overall Cognitive Status: Within Functional Limits for tasks assessed                      General Comments      Exercises General Exercises - Lower Extremity Ankle Circles/Pumps: AROM;Left;5 reps;Supine Quad Sets: AROM;Both;5 reps;Supine      Assessment/Plan    PT Assessment Patient needs continued PT services  PT Diagnosis Difficulty walking;Generalized weakness;Acute pain   PT Problem List Decreased strength;Decreased activity tolerance;Decreased balance;Decreased mobility;Decreased knowledge of use of DME;Decreased knowledge of precautions;Cardiopulmonary status limiting activity;Impaired sensation;Obesity;Decreased skin integrity;Pain  PT Treatment Interventions DME instruction;Gait training;Functional mobility training;Therapeutic activities;Therapeutic exercise;Patient/family education   PT Goals (Current goals can be found in the Care Plan section) Acute Rehab PT Goals Patient Stated Goal: To get stronger PT Goal Formulation: With patient Time For Goal Achievement: 08/01/16 Potential to Achieve Goals: Good    Frequency Min 3X/week   Barriers to discharge        Co-evaluation               End of Session Equipment Utilized During Treatment: Gait belt;Oxygen Activity Tolerance: Patient limited by fatigue;Treatment limited secondary to medical complications (Comment) (Limited by DOE) Patient left: in bed;with call bell/phone within reach Nurse Communication: Mobility status         Time: 1353-1406 PT Time Calculation (min) (ACUTE ONLY): 13 min   Charges:   PT Evaluation $PT Eval Moderate Complexity: 1 Procedure     PT G CodesDespina Pole 07/28/16, 3:34 PM Carita Pian. Sanjuana Kava, [place] Pager [phone]

## 2016-07-25 NOTE — Progress Notes (Signed)
ANTICOAGULATION CONSULT NOTE - Follow Up Consult  Pharmacy Consult for Heparin Indication: 3V CAD  Not on File  Patient Measurements: Height: 5\' 9"  (175.3 cm) Weight: (!) 319 lb 1.6 oz (144.7 kg) IBW/kg (Calculated) : 70.7  Heparin dosing weight: 105kg  Vital Signs: Temp: 98.7 F (37.1 C) (08/13 1342) Temp Source: Oral (08/13 1342) BP: 118/65 (08/13 1342) Pulse Rate: 92 (08/13 1342)  Labs:  Recent Labs  07/23/16 0249 07/23/16 0445  07/23/16 2113 07/24/16 0359 07/24/16 1311 07/25/16 0253 07/25/16 1350  HGB 10.8*  --   --   --  9.8*  --  9.8*  --   HCT 33.9*  --   --   --  31.0*  --  31.6*  --   PLT 183  --   --   --  189  --  218  --   HEPARINUNFRC 0.10*  --   < > 0.27* 0.34  --   --  <0.10*  CREATININE  --  1.51*  --   --   --  1.53* 1.55*  --   CKTOTAL  --   --   --   --   --  338  --   --   < > = values in this interval not displayed.  Estimated Creatinine Clearance: 74.6 mL/min (by C-G formula based on SCr of 1.55 mg/dL).  Assessment: 61 yo male s/p cath which showed 3V CAD. Pt refused CABG and for PCI eventually. MRI of foot consistent with osteo and abscess, so delaying PCI and now s/p transmetatarsal amputation. Possible PCI 8/16-8/17. CBC stable. Heparin re-started this morning  post-op.  HL< 0.10 on 2200 units/hr- Nurse states no issues with line or bleeding.   Goal of Therapy:  Heparin level 0.3-0.7 units/ml Monitor platelets by anticoagulation protocol: Yes    Plan:  - Increase heparin to 2500 units/hr - 6 hour HL - Daily HL, CBC - Monitor s/sx of bleeding  Jearld Fenton D Pharmacy Resident  Pager [phone] 07/25/2016 3:38 PM

## 2016-07-25 NOTE — Progress Notes (Signed)
ANTICOAGULATION CONSULT NOTE - Follow Up Consult  Pharmacy Consult for heparin Indication: CAD awaiting possible PCI  Labs:  Recent Labs  07/23/16 0249 07/23/16 0445  07/24/16 0359 07/24/16 1311 07/25/16 0253 07/25/16 1350 07/25/16 2200  HGB 10.8*  --   --  9.8*  --  9.8*  --   --   HCT 33.9*  --   --  31.0*  --  31.6*  --   --   PLT 183  --   --  189  --  218  --   --   HEPARINUNFRC 0.10*  --   < > 0.34  --   --  <0.10* 0.28*  CREATININE  --  1.51*  --   --  1.53* 1.55*  --   --   CKTOTAL  --   --   --   --  338  --   --   --   < > = values in this interval not displayed.   Assessment/Plan:  60yo male slightly subtherapeutic on heparin after rate increase though RN reports that IV was leaking for unknown amount of time (bed was soaked) and IV needed to be changed; suspect SS level would be >0.3. Will continue gtt at current rate for now and check with am labs.   Wynona Neat, PharmD, BCPS  07/25/2016,10:50 PM

## 2016-07-25 NOTE — Progress Notes (Signed)
Subjective:    No complaints this afternoon.   Objective:   Temp:  [98.3 F (36.8 C)-99.5 F (37.5 C)] 99.5 F (37.5 C) (08/13 0429) Pulse Rate:  [85-108] 92 (08/13 0429) Resp:  [16-20] 18 (08/13 0429) BP: (125-150)/(68-87) 130/71 (08/13 0429) SpO2:  [92 %-100 %] 100 % (08/13 0429) Last BM Date: 07/22/16  Filed Weights   07/18/16 1620 07/18/16 2305 07/22/16 0553  Weight: (!) 317 lb (143.8 kg) (!) 317 lb 7.4 oz (144 kg) (!) 319 lb 1.6 oz (144.7 kg)    Intake/Output Summary (Last 24 hours) at 07/25/16 0802 Last data filed at 07/25/16 0432  Gross per 24 hour  Intake          2286.85 ml  Output              810 ml  Net          1476.85 ml     Exam:  General: NAD  HEENT: sclera clear, throat clear  Resp: CTAB  Cardiac: RRR, no m/r/g, no jvd  GI: abdomen soft, NT, ND  MSK: no LE edema  Neuro: no focal deficits  Psych: appropriate affect  Lab Results:  Basic Metabolic Panel:  Recent Labs Lab 07/23/16 0445 07/24/16 1311 07/25/16 0253  NA 136 135 139  K 3.7 3.8 3.7  CL 104 106 105  CO2 20* 21* 22  GLUCOSE 175* 187* 160*  BUN $Re'17 18 16  'CZU$ CREATININE 1.51* 1.53* 1.55*  CALCIUM 8.6* 8.4* 8.7*    Liver Function Tests: No results for input(s): AST, ALT, ALKPHOS, BILITOT, PROT, ALBUMIN in the last 168 hours.  CBC:  Recent Labs Lab 07/23/16 0249 07/24/16 0359 07/25/16 0253  WBC 10.3 7.7 9.4  HGB 10.8* 9.8* 9.8*  HCT 33.9* 31.0* 31.6*  MCV 86.7 86.1 88.3  PLT 183 189 218    Cardiac Enzymes:  Recent Labs Lab 07/18/16 2357 07/19/16 0508 07/19/16 1253 07/24/16 1311  CKTOTAL  --   --   --  338  TROPONINI 0.24* 0.18* 0.16*  --     BNP: No results for input(s): PROBNP in the last 8760 hours.  Coagulation:  Recent Labs Lab 07/19/16 1105  INR 1.00    ECG:   Medications:   Scheduled Medications: . allopurinol  300 mg Oral Daily  . amLODipine  10 mg Oral Daily  . antiseptic oral rinse  7 mL Mouth Rinse BID  . aspirin  81 mg  Oral Daily  . cefTRIAXone (ROCEPHIN)  IV  2 g Intravenous Q24H  . DAPTOmycin (CUBICIN)  IV  1,000 mg Intravenous Q24H  . doxazosin  8 mg Oral Daily  . hydrALAZINE  37.5 mg Oral BID  . insulin aspart  0-20 Units Subcutaneous TID WC  . insulin NPH Human  22 Units Subcutaneous QHS   And  . insulin NPH Human  20 Units Subcutaneous QAC breakfast  . isosorbide mononitrate  60 mg Oral Daily  . metoprolol succinate  50 mg Oral Daily  . pantoprazole  40 mg Oral Daily  . sodium chloride flush  3 mL Intravenous Q12H  . ticagrelor  90 mg Oral BID    Infusions: . sodium chloride 10 mL (07/24/16 1118)  . heparin 2,200 Units/hr (07/25/16 0551)  . nitroGLYCERIN 15 mcg/min (07/25/16 0422)    PRN Medications: sodium chloride, acetaminophen **OR** acetaminophen, acetaminophen, HYDROmorphone (DILAUDID) injection, methocarbamol **OR** methocarbamol (ROBAXIN)  IV, metoCLOPramide **OR** metoCLOPramide (REGLAN) injection, nitroGLYCERIN, ondansetron (ZOFRAN) IV, ondansetron **OR** ondansetron (ZOFRAN)  IV, sodium chloride flush     Assessment/Plan    1. Chest pain/CAD - concern for unstable anginal, cath with 3 vessel disease. Patiend refused CABG, plans for PCI intervention. Procedure put on hold due to ongoing foot infection - medical therapy with ASA, imdur, Toprol, brillinta. No ACE given poor renal function.   -will need to discuss with interventional Monday timing of procedure. From Dr Thompson Caul cath notes would consider doing Wed or Thurs.    2. Chronic combined systolic/diastolic HF  echo 07/3506 LVEF 57-32%, grade I diastolic dysfunction - cath LV gram 07/2016 LVEF 50-55%, LVEDP 19  -+1.4 liters yesterday, negative 1.4 liters since admission - no diuresis at this time in setting of upcoming PCI procedure and borderline renal function. ARB on hold.    3. Foot wound/Infection - osteomyelitis abscess of the MTP joint and great toe - s/p transmetatarsal amputation earlier today - appreciate  assistance of internal medicine, ID, and ortho. Defer management to these teams.        Carlyle Dolly, M.D.

## 2016-07-25 NOTE — Progress Notes (Signed)
Subjective: No new complaints   Antibiotics:  Anti-infectives    Start     Dose/Rate Route Frequency Ordered Stop   07/25/16 1000  ceftaroline (TEFLARO) 600 mg in sodium chloride 0.9 % 250 mL IVPB     600 mg 250 mL/hr over 60 Minutes Intravenous Every 12 hours 07/25/16 0913     07/24/16 1400  DAPTOmycin (CUBICIN) 1,000 mg in sodium chloride 0.9 % IVPB  Status:  Discontinued     1,000 mg 240 mL/hr over 30 Minutes Intravenous Every 24 hours 07/24/16 1232 07/25/16 0913   07/24/16 1230  cefTRIAXone (ROCEPHIN) 2 g in dextrose 5 % 50 mL IVPB  Status:  Discontinued     2 g 100 mL/hr over 30 Minutes Intravenous Every 24 hours 07/24/16 1219 07/25/16 0913   07/24/16 0900  ceFAZolin (ANCEF) 3 g in dextrose 5 % 50 mL IVPB  Status:  Discontinued    Comments:  CAN YOU PLEASE NOT GIVE THIS PRE-OP BUT PERIOPERATIVELY SO WE CAN GET SOME INTRA-OPERATIVE CULTURES? KEES VAN DAM   3 g 130 mL/hr over 30 Minutes Intravenous To [facility] 07/23/16 1918 07/24/16 0826   07/24/16 0849  ceFAZolin (ANCEF) 1 GM/50ML IVPB    Comments:  Marinda Elk   : cabinet override      07/24/16 0849 07/24/16 2059   07/24/16 0849  ceFAZolin (ANCEF) 2-4 GM/100ML-% IVPB    Comments:  Marinda Elk   : cabinet override      07/24/16 0849 07/24/16 2059   07/24/16 0600  ceFAZolin (ANCEF) 3 g in dextrose 5 % 50 mL IVPB  Status:  Discontinued     3 g 130 mL/hr over 30 Minutes Intravenous On call to O.R. 07/23/16 1749 07/23/16 1918      Medications: Scheduled Meds: . allopurinol  300 mg Oral Daily  . amLODipine  10 mg Oral Daily  . antiseptic oral rinse  7 mL Mouth Rinse BID  . aspirin  81 mg Oral Daily  . ceFTAROline (TEFLARO) IV  600 mg Intravenous Q12H  . doxazosin  8 mg Oral Daily  . furosemide  40 mg Oral Daily  . hydrALAZINE  37.5 mg Oral BID  . insulin aspart  0-20 Units Subcutaneous TID WC  . insulin NPH Human  22 Units Subcutaneous QHS   And  . insulin NPH Human  20 Units Subcutaneous QAC  breakfast  . isosorbide mononitrate  60 mg Oral Daily  . metoprolol succinate  50 mg Oral Daily  . pantoprazole  40 mg Oral Daily  . rosuvastatin  40 mg Oral q1800  . sodium chloride flush  3 mL Intravenous Q12H  . ticagrelor  90 mg Oral BID   Continuous Infusions: . sodium chloride 10 mL (07/24/16 1118)  . heparin 2,500 Units/hr (07/25/16 1546)  . nitroGLYCERIN 15 mcg/min (07/25/16 0422)   PRN Meds:.sodium chloride, acetaminophen **OR** acetaminophen, acetaminophen, HYDROmorphone (DILAUDID) injection, methocarbamol **OR** methocarbamol (ROBAXIN)  IV, metoCLOPramide **OR** metoCLOPramide (REGLAN) injection, nitroGLYCERIN, ondansetron (ZOFRAN) IV, ondansetron **OR** ondansetron (ZOFRAN) IV, sodium chloride flush    Objective: Weight change:   Intake/Output Summary (Last 24 hours) at 07/25/16 1859 Last data filed at 07/25/16 1300  Gross per 24 hour  Intake           932.35 ml  Output             1300 ml  Net          -367.65 ml   Blood  pressure 118/65, pulse 92, temperature 98.7 F (37.1 C), temperature source Oral, resp. rate 18, height '5\' 9"'  (1.753 m), weight (!) 319 lb 1.6 oz (144.7 kg), SpO2 98 %. Temp:  [98.7 F (37.1 C)-99.5 F (37.5 C)] 98.7 F (37.1 C) (08/13 1342) Pulse Rate:  [92-108] 92 (08/13 1342) Resp:  [18-20] 18 (08/13 1342) BP: (118-150)/(65-87) 118/65 (08/13 1342) SpO2:  [97 %-100 %] 98 % (08/13 1342)  Physical Exam:   General: Alert and awake, oriented x3, not in any acute distress, obese HEENT: anicteric sclera,  EOMI, oropharynx clear and without exudate Cardiovascular: regular rate, normal r,  no murmur rubs or gallops Pulmonary: clear to auscultation bilaterally, no wheezing, Gastrointestinal: soft nontender, nondistended, Musculoskeletal: Skin, soft tissue: foot is bandaged  CBC:  CBC Latest Ref Rng & Units 07/25/2016 07/24/2016 07/23/2016  WBC 4.0 - 10.5 K/uL 9.4 7.7 10.3  Hemoglobin 13.0 - 17.0 g/dL 9.8(L) 9.8(L) 10.8(L)  Hematocrit 39.0 -  52.0 % 31.6(L) 31.0(L) 33.9(L)  Platelets 150 - 400 K/uL 218 189 183     BMET  Recent Labs  07/24/16 1311 07/25/16 0253  NA 135 139  K 3.8 3.7  CL 106 105  CO2 21* 22  GLUCOSE 187* 160*  BUN 18 16  CREATININE 1.53* 1.55*  CALCIUM 8.4* 8.7*     Liver Panel  No results for input(s): PROT, ALBUMIN, AST, ALT, ALKPHOS, BILITOT, BILIDIR, IBILI in the last 72 hours.     Sedimentation Rate  Recent Labs  07/24/16 0359  ESRSEDRATE 131*   C-Reactive Protein  Recent Labs  07/23/16 1621  CRP 28.9*    Micro Results: Recent Results (from the past 720 hour(s))  Culture, blood (routine x 2)     Status: None (Preliminary result)   Collection Time: 07/21/16 10:40 PM  Result Value Ref Range Status   Specimen Description BLOOD LEFT ARM  Final   Special Requests BOTTLES DRAWN AEROBIC AND ANAEROBIC 10ML  Final   Culture NO GROWTH 4 DAYS  Final   Report Status PENDING  Incomplete  Culture, blood (routine x 2)     Status: None (Preliminary result)   Collection Time: 07/21/16 10:48 PM  Result Value Ref Range Status   Specimen Description BLOOD LEFT HAND  Final   Special Requests BOTTLES DRAWN AEROBIC AND ANAEROBIC 10ML  Final   Culture NO GROWTH 4 DAYS  Final   Report Status PENDING  Incomplete  Culture, Urine     Status: None   Collection Time: 07/22/16 12:33 AM  Result Value Ref Range Status   Specimen Description URINE, CLEAN CATCH  Final   Special Requests NONE  Final   Culture NO GROWTH  Final   Report Status 07/23/2016 FINAL  Final  Surgical PCR screen     Status: None   Collection Time: 07/23/16  8:11 PM  Result Value Ref Range Status   MRSA, PCR NEGATIVE NEGATIVE Final   Staphylococcus aureus NEGATIVE NEGATIVE Final    Comment:        The Xpert SA Assay (FDA approved for NASAL specimens in patients over 2 years of age), is one component of a comprehensive surveillance program.  Test performance has been validated by [facility] for patients greater than  or equal to 40 year old. It is not intended to diagnose infection nor to guide or monitor treatment.     Studies/Results: No results found.    Assessment/Plan:  INTERVAL HISTORY:   Patient taken to the operating room this morning and  is status post transmetatarsal imitation.   INTRA-OP cultures were never taken     Principal Problem:   Unstable angina pectoris (HCC) Active Problems:   CAD in native artery   Chronic combined systolic and diastolic CHF (congestive heart failure) (HCC)   Coronary artery disease   Hyperlipemia   Renal insufficiency   Penetrating wound of right foot   Type 1 diabetes mellitus with right diabetic foot ulcer (HCC)   Chronic osteomyelitis of toe of right foot (HCC)   CKD (chronic kidney disease) stage 3, GFR 30-59 ml/min   Chronic osteomyelitis of right foot (HCC)   Cellulitis and abscess of leg, except foot    Joshua [name] is a 60 y.o. male with  unstable angina with 3 V disease in need of PCI, with CKD, DM that is poorly controlled and with neuropathy, hx of prior amputations of 2 digits on same foot now with osteomyelitis in distal and possibly proximal phalanx of great toe and foot abscess now sp TMT amputation  #  #1 DFU with osteomyelitis and foot abscess: despite my efforts to ask for intra-op cultures they were never taken. I will directly call Dr. Sharol Given or surgeon involved to ask for cultures in the OR--it seems an insignifcant trouble to send such tissue for culture  --I am consolidating him to IV Teflaro and would set him up for 3 weeks of IV teflaro  --we will plan on seeing him in followup in ID clinic before he finishes his IV abx  With infection from his foot controlled by amputation and antibiotics on board he should be safe from ID stnadpoint to proceed for PCI  Diagnosis:  DFU with osteomyelitis sp amputation  Culture Result: none performed  Not on File  Discharge antibiotics: Per pharmacy protocol: Teflaro     Duration:  3 weeks  End Date: September 1st  Central line Care Per Protocol:  Labs weekly while on IV antibiotics: x__ CBC with differential _x_ CMP x__ CRP x__ ESR  ID followup with ID MD next 2-3 weeks  I will sign off please call with further questions.  Fax weekly labs to [phone]      LOS: 6 days   Alcide Evener 07/25/2016, 6:59 PM

## 2016-07-25 NOTE — Progress Notes (Addendum)
Inpatient Rehabilitation  PT is recommending IP Rehab however note pt. currently with limited activity tolerance due to dyspnea and NWB status.  Note pt. may tentatively have PCI on Wednesday or Thursday.  Will follow along for timing and appropriateness for IP Rehab consult. Pt's insurance company will require an OT consult if he is to be considered for IP Rehab.  Please order OT consult if you are agreeable.  Please call if questions.  [place] Admissions Coordinator Cell [phone] Office [phone]

## 2016-07-25 NOTE — Progress Notes (Addendum)
PROGRESS NOTE  Joshua [name] DSK:[id number] DOB: Mar 25, 1956 DOA: 07/18/2016 PCP: [name], MD  HPI/Recap of past 60 hours: 60year old male with CAD s/p LAD stent admitted with unstable angina by cardiology service, plan for PCI held off due to right foot infection for which he has now had TM amputation on 8/12 with Dr. Sharol Given. ID and TRH were consulted as well. ID plans empiric antibiotics for a couple of weeks, and cardiology will decide this coming week on PCI, likely stenting as patient has declined CABG.  This AM he is resting comfortably, has no complaints. No chest pain, no dizziness or dyspnea. Right foot pain is tolerable.   Assessment/Plan: Principal Problem:   Unstable angina pectoris (HCC) and CAD in native artery - stable, no complaints, on telemetry, per cardiology team who are primary.   Chronic combined systolic and diastolic CHF (congestive heart failure) (HCC)   Coronary artery disease   Hyperlipemia   Renal insufficiency - follow daily, note he is on lasix $Remove'40mg'mBgOphH$  PO daily. His creatinine has remain essentially unchanged despite fluid administration, and he does have some peripheral edema currently.   Penetrating wound of right foot - now s/p TM amputation with Dr. Sharol Given on 8/12, wound vac in place   Type 1 diabetes mellitus with right diabetic foot ulcer (HCC)   Chronic osteomyelitis of toe of right foot ([place]) - s/p TM amputation, abx per ID   CKD (chronic kidney disease) stage 3, GFR 30-59 ml/min  Code Status: FULL   Family Communication: None present at bedside this AM.   Disposition Plan: Likely rehab vs home.  Consultants:  Primary team is Cardology  ID, TRH and Ortho are consulting.   Procedures:  Cardiac cath 8/7  TM amputation right foot 8/12   Antimicrobials:  Dapto 8/12  Rocephin 8/12    Objective: Vitals:   07/24/16 1109 07/24/16 1347 07/24/16 1950 07/25/16 0429  BP: 139/76 140/68 (!) 150/87 130/71  Pulse: 89 85 (!) 108 92    Resp:  $Remo'18 20 18  'SVCNI$ Temp:  98.3 F (36.8 C) 99.3 F (37.4 C) 99.5 F (37.5 C)  TempSrc:  Oral Oral Oral  SpO2: 92% 97% 97% 100%  Weight:      Height:        Intake/Output Summary (Last 24 hours) at 07/25/16 0803 Last data filed at 07/25/16 6203  Gross per 24 hour  Intake          2286.85 ml  Output              810 ml  Net          1476.85 ml   Filed Weights   07/18/16 1620 07/18/16 2305 07/22/16 0553  Weight: (!) 143.8 kg (317 lb) (!) 144 kg (317 lb 7.4 oz) (!) 144.7 kg (319 lb 1.6 oz)    Exam: General:  Alert, oriented, calm, in no acute distress Eyes: pupils round and reactive to light and accomodation, clear sclerea Neck: supple, no masses, trachea mildline  Cardiovascular: RRR, no murmurs or rubs, 1+ peripheral edema in left foot Respiratory: clear to auscultation bilaterally, no wheezes, no crackles  Abdomen: soft, nontender, nondistended, normal bowel tones heard  Skin: dry, no rashes  Musculoskeletal: no joint effusions, normal range of motion, R forefoot with vac Psychiatric: appropriate affect, normal speech  Neurologic: extraocular muscles intact, clear speech, moving all extremities with intact sensorium    Data Reviewed: CBC:  Recent Labs Lab 07/21/16 0318 07/22/16 0445 07/23/16 0249  07/24/16 0359 07/25/16 0253  WBC 10.0 10.8* 10.3 7.7 9.4  HGB 11.0* 11.1* 10.8* 9.8* 9.8*  HCT 35.3* 35.1* 33.9* 31.0* 31.6*  MCV 88.7 88.0 86.7 86.1 88.3  PLT 142* 150 183 189 784   Basic Metabolic Panel:  Recent Labs Lab 07/21/16 1148 07/22/16 0445 07/23/16 0445 07/24/16 1311 07/25/16 0253  NA 134* 136 136 135 139  K 3.7 3.6 3.7 3.8 3.7  CL 105 105 104 106 105  CO2 21* 22 20* 21* 22  GLUCOSE 152* 156* 175* 187* 160*  BUN $Re'17 19 17 18 16  'aln$ CREATININE 1.61* 1.52* 1.51* 1.53* 1.55*  CALCIUM 8.9 8.6* 8.6* 8.4* 8.7*   GFR: Estimated Creatinine Clearance: 74.6 mL/min (by C-G formula based on SCr of 1.55 mg/dL). Liver Function Tests: No results for input(s):  AST, ALT, ALKPHOS, BILITOT, PROT, ALBUMIN in the last 168 hours. No results for input(s): LIPASE, AMYLASE in the last 168 hours. No results for input(s): AMMONIA in the last 168 hours. Coagulation Profile:  Recent Labs Lab 07/19/16 1105  INR 1.00   Cardiac Enzymes:  Recent Labs Lab 07/18/16 2357 07/19/16 0508 07/19/16 1253 07/24/16 1311  CKTOTAL  --   --   --  338  TROPONINI 0.24* 0.18* 0.16*  --    BNP (last 3 results) No results for input(s): PROBNP in the last 8760 hours. HbA1C: No results for input(s): HGBA1C in the last 72 hours. CBG:  Recent Labs Lab 07/24/16 1004 07/24/16 1116 07/24/16 1630 07/24/16 2132 07/25/16 0632  GLUCAP 171* 171* 238* 157* 149*   Lipid Profile: No results for input(s): CHOL, HDL, LDLCALC, TRIG, CHOLHDL, LDLDIRECT in the last 72 hours. Thyroid Function Tests: No results for input(s): TSH, T4TOTAL, FREET4, T3FREE, THYROIDAB in the last 72 hours. Anemia Panel: No results for input(s): VITAMINB12, FOLATE, FERRITIN, TIBC, IRON, RETICCTPCT in the last 72 hours. Urine analysis:    Component Value Date/Time   COLORURINE YELLOW 07/22/2016 0033   APPEARANCEUR CLOUDY (A) 07/22/2016 0033   LABSPEC 1.021 07/22/2016 0033   PHURINE 5.0 07/22/2016 0033   GLUCOSEU NEGATIVE 07/22/2016 0033   HGBUR SMALL (A) 07/22/2016 0033   BILIRUBINUR NEGATIVE 07/22/2016 0033   KETONESUR NEGATIVE 07/22/2016 0033   PROTEINUR >300 (A) 07/22/2016 0033   UROBILINOGEN 0.2 11/01/2011 1410   NITRITE NEGATIVE 07/22/2016 0033   LEUKOCYTESUR NEGATIVE 07/22/2016 0033   Sepsis Labs: $RemoveBefo'[email or web address]'[email or web address]$ (procalcitonin:4,lacticidven:4)  ) Recent Results (from the past 240 hour(s))  Culture, blood (routine x 2)     Status: None (Preliminary result)   Collection Time: 07/21/16 10:40 PM  Result Value Ref Range Status   Specimen Description BLOOD LEFT ARM  Final   Special Requests BOTTLES DRAWN AEROBIC AND ANAEROBIC 10ML  Final   Culture NO GROWTH 3 DAYS  Final   Report Status  PENDING  Incomplete  Culture, blood (routine x 2)     Status: None (Preliminary result)   Collection Time: 07/21/16 10:48 PM  Result Value Ref Range Status   Specimen Description BLOOD LEFT HAND  Final   Special Requests BOTTLES DRAWN AEROBIC AND ANAEROBIC 10ML  Final   Culture NO GROWTH 3 DAYS  Final   Report Status PENDING  Incomplete  Culture, Urine     Status: None   Collection Time: 07/22/16 12:33 AM  Result Value Ref Range Status   Specimen Description URINE, CLEAN CATCH  Final   Special Requests NONE  Final   Culture NO GROWTH  Final   Report Status 07/23/2016 FINAL  Final  Surgical PCR screen     Status: None   Collection Time: 07/23/16  8:11 PM  Result Value Ref Range Status   MRSA, PCR NEGATIVE NEGATIVE Final   Staphylococcus aureus NEGATIVE NEGATIVE Final    Comment:        The Xpert SA Assay (FDA approved for NASAL specimens in patients over 27 years of age), is one component of a comprehensive surveillance program.  Test performance has been validated by [facility] for patients greater than or equal to 73 year old. It is not intended to diagnose infection nor to guide or monitor treatment.       Studies: No results found.  Scheduled Meds: . allopurinol  300 mg Oral Daily  . amLODipine  10 mg Oral Daily  . antiseptic oral rinse  7 mL Mouth Rinse BID  . aspirin  81 mg Oral Daily  . cefTRIAXone (ROCEPHIN)  IV  2 g Intravenous Q24H  . DAPTOmycin (CUBICIN)  IV  1,000 mg Intravenous Q24H  . doxazosin  8 mg Oral Daily  . hydrALAZINE  37.5 mg Oral BID  . insulin aspart  0-20 Units Subcutaneous TID WC  . insulin NPH Human  22 Units Subcutaneous QHS   And  . insulin NPH Human  20 Units Subcutaneous QAC breakfast  . isosorbide mononitrate  60 mg Oral Daily  . metoprolol succinate  50 mg Oral Daily  . pantoprazole  40 mg Oral Daily  . sodium chloride flush  3 mL Intravenous Q12H  . ticagrelor  90 mg Oral BID    Continuous Infusions: . sodium chloride 10  mL (07/24/16 1118)  . heparin 2,200 Units/hr (07/25/16 0551)  . nitroGLYCERIN 15 mcg/min (07/25/16 0422)     LOS: 6 days   Time spent: 26 min  Mir Marry Guan, MD [facility] Pager [phone]  If 7PM-7AM, please contact night-coverage [email or web address] Password [email or web address] 07/25/2016, 8:03 AM

## 2016-07-26 ENCOUNTER — Telehealth: Payer: Self-pay | Admitting: Pharmacist

## 2016-07-26 ENCOUNTER — Encounter (HOSPITAL_COMMUNITY): Payer: Self-pay | Admitting: Orthopedic Surgery

## 2016-07-26 DIAGNOSIS — E10621 Type 1 diabetes mellitus with foot ulcer: Secondary | ICD-10-CM

## 2016-07-26 DIAGNOSIS — M86671 Other chronic osteomyelitis, right ankle and foot: Secondary | ICD-10-CM

## 2016-07-26 LAB — BASIC METABOLIC PANEL
Anion gap: 9 (ref 5–15)
BUN: 12 mg/dL (ref 6–20)
CO2: 22 mmol/L (ref 22–32)
Calcium: 8.6 mg/dL — ABNORMAL LOW (ref 8.9–10.3)
Chloride: 107 mmol/L (ref 101–111)
Creatinine, Ser: 1.57 mg/dL — ABNORMAL HIGH (ref 0.61–1.24)
GFR calc Af Amer: 55 mL/min — ABNORMAL LOW (ref >60)
GFR calc non Af Amer: 47 mL/min — ABNORMAL LOW (ref >60)
Glucose, Bld: 197 mg/dL — ABNORMAL HIGH (ref 65–99)
Potassium: 3.4 mmol/L — ABNORMAL LOW (ref 3.5–5.1)
Sodium: 138 mmol/L (ref 135–145)

## 2016-07-26 LAB — CBC
HCT: 30.2 % — ABNORMAL LOW (ref 39.0–52.0)
Hemoglobin: 9.5 g/dL — ABNORMAL LOW (ref 13.0–17.0)
MCH: 27 pg (ref 26.0–34.0)
MCHC: 31.5 g/dL (ref 30.0–36.0)
MCV: 85.8 fL (ref 78.0–100.0)
Platelets: 243 10*3/uL (ref 150–400)
RBC: 3.52 MIL/uL — ABNORMAL LOW (ref 4.22–5.81)
RDW: 14.6 % (ref 11.5–15.5)
WBC: 9.9 10*3/uL (ref 4.0–10.5)

## 2016-07-26 LAB — GLUCOSE, CAPILLARY
Glucose-Capillary: 173 mg/dL — ABNORMAL HIGH (ref 65–99)
Glucose-Capillary: 226 mg/dL — ABNORMAL HIGH (ref 65–99)
Glucose-Capillary: 205 mg/dL — ABNORMAL HIGH (ref 65–99)
Glucose-Capillary: 136 mg/dL — ABNORMAL HIGH (ref 65–99)

## 2016-07-26 LAB — CULTURE, BLOOD (ROUTINE X 2)
Culture: NO GROWTH
Culture: NO GROWTH

## 2016-07-26 LAB — HEPARIN LEVEL (UNFRACTIONATED)
Heparin Unfractionated: 0.62 IU/mL (ref 0.30–0.70)
Heparin Unfractionated: 0.98 IU/mL — ABNORMAL HIGH (ref 0.30–0.70)
Heparin Unfractionated: 0.84 IU/mL — ABNORMAL HIGH (ref 0.30–0.70)

## 2016-07-26 MED ORDER — HEPARIN (PORCINE) IN NACL 100-0.45 UNIT/ML-% IJ SOLN
1900.0000 [IU]/h | INTRAMUSCULAR | Status: DC
  Administered 2016-07-26 – 2016-07-27 (×2): 2100 [IU]/h via INTRAVENOUS
  Administered 2016-07-27 – 2016-07-28 (×2): 1900 [IU]/h via INTRAVENOUS
  Filled 2016-07-26 (×4): qty 250

## 2016-07-26 MED ORDER — POTASSIUM CHLORIDE CRYS ER 20 MEQ PO TBCR
40.0000 meq | EXTENDED_RELEASE_TABLET | Freq: Once | ORAL | Status: AC
  Administered 2016-07-26: 40 meq via ORAL
  Filled 2016-07-26: qty 2

## 2016-07-26 MED ORDER — METOPROLOL SUCCINATE ER 25 MG PO TB24
75.0000 mg | ORAL_TABLET | Freq: Every day | ORAL | Status: DC
  Administered 2016-07-27 – 2016-08-03 (×8): 75 mg via ORAL
  Filled 2016-07-26 (×8): qty 1

## 2016-07-26 MED ORDER — POTASSIUM CHLORIDE CRYS ER 20 MEQ PO TBCR
20.0000 meq | EXTENDED_RELEASE_TABLET | Freq: Every day | ORAL | Status: DC
  Administered 2016-07-27 – 2016-08-03 (×8): 20 meq via ORAL
  Filled 2016-07-26 (×8): qty 1

## 2016-07-26 NOTE — Progress Notes (Addendum)
Subjective:  No chest pain  Objective:   Vital Signs : Vitals:   07/26/16 0337 07/26/16 0534 07/26/16 1003 07/26/16 1004  BP: (!) 145/88  (!) 164/88   Pulse: 84   87  Resp: 20     Temp: 99.4 F (37.4 C)     TempSrc: Oral     SpO2: 96%     Weight:  (!) 319 lb 0.1 oz (144.7 kg)    Height:        Intake/Output from previous day:  Intake/Output Summary (Last 24 hours) at 07/26/16 1017 Last data filed at 07/26/16 0454  Gross per 24 hour  Intake              730 ml  Output             2175 ml  Net            -1445 ml    I/O since admission: -2564  Wt Readings from Last 3 Encounters:  07/26/16 (!) 319 lb 0.1 oz (144.7 kg)  02/16/16 297 lb (134.7 kg)  10/14/15 279 lb 14.4 oz (127 kg)    Medications: . allopurinol  300 mg Oral Daily  . amLODipine  10 mg Oral Daily  . antiseptic oral rinse  7 mL Mouth Rinse BID  . aspirin  81 mg Oral Daily  . ceFTAROline (TEFLARO) IV  600 mg Intravenous Q12H  . doxazosin  8 mg Oral Daily  . furosemide  40 mg Oral Daily  . hydrALAZINE  37.5 mg Oral BID  . insulin aspart  0-20 Units Subcutaneous TID WC  . insulin NPH Human  22 Units Subcutaneous QHS   And  . insulin NPH Human  20 Units Subcutaneous QAC breakfast  . isosorbide mononitrate  60 mg Oral Daily  . metoprolol succinate  50 mg Oral Daily  . pantoprazole  40 mg Oral Daily  . [START ON 07/27/2016] potassium chloride  20 mEq Oral Daily  . rosuvastatin  40 mg Oral q1800  . sodium chloride flush  3 mL Intravenous Q12H  . ticagrelor  90 mg Oral BID    . sodium chloride 10 mL (07/24/16 1118)  . heparin 2,500 Units/hr (07/26/16 0335)  . nitroGLYCERIN 15 mcg/min (07/25/16 0422)    Physical Exam:   General appearance: alert, cooperative, no distress and morbidly obese Neck: no adenopathy, no JVD, supple, symmetrical, trachea midline and thyroid not enlarged, symmetric, no tenderness/mass/nodules Lungs: clear to auscultation bilaterally Heart: regular rate and rhythm and  1/6 Abdomen: soft, non-tender; bowel sounds normal; no masses,  no organomegaly Extremities: R MT amputation Neurologic: Grossly normal   Rate: 90  Rhythm: sinus tachycardia  ECG (independently read by me): ST at 118   will re-check today  Lab Results:   Recent Labs  07/24/16 1311 07/25/16 0253 07/26/16 0349  NA 135 139 138  K 3.8 3.7 3.4*  CL 106 105 107  CO2 21* 22 22  GLUCOSE 187* 160* 197*  BUN '18 16 12  ' CREATININE 1.53* 1.55* 1.57*  CALCIUM 8.4* 8.7* 8.6*    Hepatic Function Latest Ref Rng & Units 02/16/2016 10/13/2015 07/03/2014  Total Protein 6.1 - 8.1 g/dL 6.9 7.1 6.6  Albumin 3.6 - 5.1 g/dL 3.5(L) 3.6 2.8(L)  AST 10 - 35 U/L '20 10 23  ' ALT 9 - 46 U/L '23 11 21  ' Alk Phosphatase 40 - 115 U/L 131(H) 112 120(H)  Total Bilirubin 0.2 - 1.2 mg/dL 0.4 0.3 <0.2(L)  Recent Labs  07/24/16 0359 07/25/16 0253 07/26/16 0349  WBC 7.7 9.4 9.9  HGB 9.8* 9.8* 9.5*  HCT 31.0* 31.6* 30.2*  MCV 86.1 88.3 85.8  PLT 189 218 243    No results for input(s): TROPONINI in the last 72 hours.  Invalid input(s): CK, MB  Lab Results  Component Value Date   TSH 3.175 07/18/2016   No results for input(s): HGBA1C in the last 72 hours.  No results for input(s): PROT, ALBUMIN, AST, ALT, ALKPHOS, BILITOT, BILIDIR, IBILI in the last 72 hours. No results for input(s): INR in the last 72 hours. BNP (last 3 results)  Recent Labs  07/29/15 0354  BNP 148.7*    ProBNP (last 3 results) No results for input(s): PROBNP in the last 8760 hours.   Lipid Panel     Component Value Date/Time   CHOL 205 (H) 02/16/2016 0959   TRIG 101 02/16/2016 0959   HDL 102 02/16/2016 0959   CHOLHDL 2.0 02/16/2016 0959   VLDL 20 02/16/2016 0959   LDLCALC 83 02/16/2016 0959     Imaging:  No results found.    Assessment/Plan:   Principal Problem:   Unstable angina pectoris (HCC) Active Problems:   CAD in native artery   Chronic combined systolic and diastolic CHF (congestive heart  failure) (HCC)   Coronary artery disease   Hyperlipemia   Renal insufficiency   Penetrating wound of right foot   Type 1 diabetes mellitus with right diabetic foot ulcer (HCC)   Chronic osteomyelitis of toe of right foot (HCC)   CKD (chronic kidney disease) stage 3, GFR 30-59 ml/min   Chronic osteomyelitis of right foot (HCC)   Cellulitis and abscess of leg, except foot  1. CAD with 3 vessel disease;  Plan for PCI to LAD and LCx, deferred from last week due to fever and osteomyelitis. ? timiing of PCI  Will discuss with ID and per his note since s/p amputation and on antibiotics may not need to wait for antibiotic completion. With Hr in the 90s, will increase Toprol dose to 75 mg  today.  2. Chronic combined systolic/diastolic Heart failure  3. Osteomyelitis abscess of MTP joint and great toe/ s/p R TM amputation; plan for 3 weeks of Teflaro per ID  4.  CKD  Cr 1.97  5.  DM    [name], MD, [facility] 07/26/2016, 10:17 AM

## 2016-07-26 NOTE — Telephone Encounter (Signed)
error 

## 2016-07-26 NOTE — Progress Notes (Signed)
PROGRESS NOTE  Joshua [name] VZC:[id number] DOB: 08-09-1956 DOA: 07/18/2016 PCP: [name], MD  HPI/Recap of past 82 hours: 60year old male with CAD s/p LAD stent admitted with unstable angina by cardiology service, plan for PCI held off due to right foot infection for which he has now had TM amputation on 8/12 with Dr. Sharol Given. ID and TRH were consulted as well. ID plans empiric Teflaro for 3 weeks, and cardiology will decide this coming week on PCI, likely stenting as patient has declined CABG.  This AM he is resting comfortably, has no complaints. No chest pain, no dizziness or dyspnea. Right foot pain is tolerable.   Assessment/Plan: Principal Problem:   Unstable angina pectoris (HCC) and CAD in native artery - stable, no complaints, on telemetry, per cardiology team who are primary. They plan PCI this week.   Chronic combined systolic and diastolic CHF (congestive heart failure) (HCC)   Coronary artery disease   Hyperlipemia   Renal insufficiency - follow daily, note he is on lasix $Remove'40mg'AvDNUHK$  PO daily. His creatinine has remain essentially unchanged despite fluid administration, and he does have some peripheral edema currently.   Penetrating wound of right foot - now s/p TM amputation with Dr. Sharol Given on 8/12, wound vac in place. ID recommends 3 weeks Teflaro with weeks labs and ID follow up prior to completion of antibiotics. Patient will need a PICC line placed but will d/w Cardiology before placing, to ensure we don't interfere with PCI.   Type 1 diabetes mellitus with right diabetic foot ulcer (HCC)   Chronic osteomyelitis of toe of right foot (HCC) - s/p TM amputation, abx per ID   CKD (chronic kidney disease) stage 3, GFR 30-59 ml/min  Code Status: FULL   Family Communication: None present at bedside this AM.   Disposition Plan: Likely rehab vs home.  Consultants:  Primary team is Cardology  ID (signed off 8/13), TRH and Ortho are consulting.   Procedures:  Cardiac  cath 8/7  TM amputation right foot 8/12   Antimicrobials:  Dapto 8/12-8/13  Rocephin 8/12 - 8/13  Teflaro 8/13 for planned 3 weeks   Objective: Vitals:   07/25/16 1342 07/25/16 2049 07/26/16 0337 07/26/16 0534  BP: 118/65 (!) 149/74 (!) 145/88   Pulse: 92 94 84   Resp: $Remo'18 18 20   'joRgJ$ Temp: 98.7 F (37.1 C) 99.8 F (37.7 C) 99.4 F (37.4 C)   TempSrc: Oral Oral Oral   SpO2: 98% 94% 96%   Weight:    (!) 144.7 kg (319 lb 0.1 oz)  Height:        Intake/Output Summary (Last 24 hours) at 07/26/16 0818 Last data filed at 07/26/16 0454  Gross per 24 hour  Intake              730 ml  Output             2175 ml  Net            -1445 ml   Filed Weights   07/18/16 2305 07/22/16 0553 07/26/16 0534  Weight: (!) 144 kg (317 lb 7.4 oz) (!) 144.7 kg (319 lb 1.6 oz) (!) 144.7 kg (319 lb 0.1 oz)    Exam: General:  Alert, oriented, calm, in no acute distress Eyes: pupils round and reactive to light and accomodation, clear sclerea Neck: supple, no masses, trachea mildline  Cardiovascular: RRR, no murmurs or rubs, 1+ peripheral edema in left foot Respiratory: clear to auscultation bilaterally, no wheezes,  no crackles  Abdomen: soft, nontender, nondistended, normal bowel tones heard  Skin: dry, no rashes  Musculoskeletal: no joint effusions, normal range of motion, R forefoot with vac Psychiatric: appropriate affect, normal speech  Neurologic: extraocular muscles intact, clear speech, moving all extremities with intact sensorium    Data Reviewed: CBC:  Recent Labs Lab 07/22/16 0445 07/23/16 0249 07/24/16 0359 07/25/16 0253 07/26/16 0349  WBC 10.8* 10.3 7.7 9.4 9.9  HGB 11.1* 10.8* 9.8* 9.8* 9.5*  HCT 35.1* 33.9* 31.0* 31.6* 30.2*  MCV 88.0 86.7 86.1 88.3 85.8  PLT 150 183 189 218 408   Basic Metabolic Panel:  Recent Labs Lab 07/22/16 0445 07/23/16 0445 07/24/16 1311 07/25/16 0253 07/26/16 0349  NA 136 136 135 139 138  K 3.6 3.7 3.8 3.7 3.4*  CL 105 104 106 105  107  CO2 22 20* 21* 22 22  GLUCOSE 156* 175* 187* 160* 197*  BUN $Re'19 17 18 16 12  'jOM$ CREATININE 1.52* 1.51* 1.53* 1.55* 1.57*  CALCIUM 8.6* 8.6* 8.4* 8.7* 8.6*   GFR: Estimated Creatinine Clearance: 73.6 mL/min (by C-G formula based on SCr of 1.57 mg/dL). Liver Function Tests: No results for input(s): AST, ALT, ALKPHOS, BILITOT, PROT, ALBUMIN in the last 168 hours. No results for input(s): LIPASE, AMYLASE in the last 168 hours. No results for input(s): AMMONIA in the last 168 hours. Coagulation Profile:  Recent Labs Lab 07/19/16 1105  INR 1.00   Cardiac Enzymes:  Recent Labs Lab 07/19/16 1253 07/24/16 1311  CKTOTAL  --  338  TROPONINI 0.16*  --    BNP (last 3 results) No results for input(s): PROBNP in the last 8760 hours. HbA1C: No results for input(s): HGBA1C in the last 72 hours. CBG:  Recent Labs Lab 07/25/16 0632 07/25/16 1115 07/25/16 1634 07/25/16 2134 07/26/16 0620  GLUCAP 149* 182* 186* 174* 173*   Lipid Profile: No results for input(s): CHOL, HDL, LDLCALC, TRIG, CHOLHDL, LDLDIRECT in the last 72 hours. Thyroid Function Tests: No results for input(s): TSH, T4TOTAL, FREET4, T3FREE, THYROIDAB in the last 72 hours. Anemia Panel: No results for input(s): VITAMINB12, FOLATE, FERRITIN, TIBC, IRON, RETICCTPCT in the last 72 hours. Urine analysis:    Component Value Date/Time   COLORURINE YELLOW 07/22/2016 0033   APPEARANCEUR CLOUDY (A) 07/22/2016 0033   LABSPEC 1.021 07/22/2016 0033   PHURINE 5.0 07/22/2016 0033   GLUCOSEU NEGATIVE 07/22/2016 0033   HGBUR SMALL (A) 07/22/2016 0033   BILIRUBINUR NEGATIVE 07/22/2016 0033   KETONESUR NEGATIVE 07/22/2016 0033   PROTEINUR >300 (A) 07/22/2016 0033   UROBILINOGEN 0.2 11/01/2011 1410   NITRITE NEGATIVE 07/22/2016 0033   LEUKOCYTESUR NEGATIVE 07/22/2016 0033   Sepsis Labs: $[email or web address]'[email or web address]$ (procalcitonin:4,lacticidven:4)  ) Recent Results (from the past 240 hour(s))  Culture, blood (routine x 2)     Status: None  (Preliminary result)   Collection Time: 07/21/16 10:40 PM  Result Value Ref Range Status   Specimen Description BLOOD LEFT ARM  Final   Special Requests BOTTLES DRAWN AEROBIC AND ANAEROBIC 10ML  Final   Culture NO GROWTH 4 DAYS  Final   Report Status PENDING  Incomplete  Culture, blood (routine x 2)     Status: None (Preliminary result)   Collection Time: 07/21/16 10:48 PM  Result Value Ref Range Status   Specimen Description BLOOD LEFT HAND  Final   Special Requests BOTTLES DRAWN AEROBIC AND ANAEROBIC 10ML  Final   Culture NO GROWTH 4 DAYS  Final   Report Status PENDING  Incomplete  Culture, Urine  Status: None   Collection Time: 07/22/16 12:33 AM  Result Value Ref Range Status   Specimen Description URINE, CLEAN CATCH  Final   Special Requests NONE  Final   Culture NO GROWTH  Final   Report Status 07/23/2016 FINAL  Final  Surgical PCR screen     Status: None   Collection Time: 07/23/16  8:11 PM  Result Value Ref Range Status   MRSA, PCR NEGATIVE NEGATIVE Final   Staphylococcus aureus NEGATIVE NEGATIVE Final    Comment:        The Xpert SA Assay (FDA approved for NASAL specimens in patients over 27 years of age), is one component of a comprehensive surveillance program.  Test performance has been validated by [facility] for patients greater than or equal to 50 year old. It is not intended to diagnose infection nor to guide or monitor treatment.       Studies: No results found.  Scheduled Meds: . allopurinol  300 mg Oral Daily  . amLODipine  10 mg Oral Daily  . antiseptic oral rinse  7 mL Mouth Rinse BID  . aspirin  81 mg Oral Daily  . ceFTAROline (TEFLARO) IV  600 mg Intravenous Q12H  . doxazosin  8 mg Oral Daily  . furosemide  40 mg Oral Daily  . hydrALAZINE  37.5 mg Oral BID  . insulin aspart  0-20 Units Subcutaneous TID WC  . insulin NPH Human  22 Units Subcutaneous QHS   And  . insulin NPH Human  20 Units Subcutaneous QAC breakfast  . isosorbide  mononitrate  60 mg Oral Daily  . metoprolol succinate  50 mg Oral Daily  . pantoprazole  40 mg Oral Daily  . [START ON 07/27/2016] potassium chloride  20 mEq Oral Daily  . potassium chloride  40 mEq Oral Once  . rosuvastatin  40 mg Oral q1800  . sodium chloride flush  3 mL Intravenous Q12H  . ticagrelor  90 mg Oral BID    Continuous Infusions: . sodium chloride 10 mL (07/24/16 1118)  . heparin 2,500 Units/hr (07/26/16 0335)  . nitroGLYCERIN 15 mcg/min (07/25/16 0422)     LOS: 7 days   Time spent: 31 min  Karleigh Bunte Marry Guan, MD [facility] Pager [phone]  If 7PM-7AM, please contact night-coverage [email or web address] Password [facility] 07/26/2016, 8:18 AM

## 2016-07-26 NOTE — Consult Note (Addendum)
WOC follow-up: Ortho service now following for assessment and plan of care after amputation surgery for toe.  Please refer to Dr Sharol Given for further questions. Please re-consult if further assistance is needed.  Thank-you,  Julien Girt MSN, [place], [place], [place], [place]

## 2016-07-26 NOTE — Progress Notes (Signed)
Physical Therapy Treatment Patient Details Name: Joshua [name] MRN: [id number] DOB: 1956/08/05 Today's Date: 07/26/2016    History of Present Illness Patient is a 60 yo male admitted 07/18/16 with unstable angina.  Cath showed 3-vessel disease.  Patient declining CABG.  Plan for PCI cancelled - patient with Rt foot wound with drainage - osteomyelitis.  Patient s/p Rt transmetatarsal amputation 07/24/16.  PCI still pending.    PMH:  CAD, MI, cardiac stents, DM, neuropathy, gastroparesis, Rt foot recurrent ulcers, CHF, HTN, morbid obesity, OSA on CPAP    PT Comments    Pt is making slow, steady progress. SpO2 97% on RA.  Follow Up Recommendations  CIR;Supervision/Assistance - 24 hour     Equipment Recommendations  Rolling walker with 5" wheels;3in1 (PT);Wheelchair (measurements PT);Wheelchair cushion (measurements PT)    Recommendations for Other Services Rehab consult     Precautions / Restrictions Precautions Precautions: Fall Restrictions Weight Bearing Restrictions: Yes RLE Weight Bearing: Non weight bearing    Mobility  Bed Mobility Overal bed mobility: Modified Independent Bed Mobility: Supine to Sit     Supine to sit: Modified independent (Device/Increase time);HOB elevated     General bed mobility comments: Use of rail and incr time  Transfers Overall transfer level: Needs assistance Equipment used: Rolling walker (2 wheeled) Transfers: Sit to/from Stand Sit to Stand: +2 physical assistance;Min assist         General transfer comment: Verbal cues for hand placement. Assist to bring hips up.  Ambulation/Gait Ambulation/Gait assistance: +2 physical assistance;Min assist Ambulation Distance (Feet): 4 Feet Assistive device: Rolling walker (2 wheeled) Gait Pattern/deviations: Step-to pattern Gait velocity: decr Gait velocity interpretation: Below normal speed for age/gender General Gait Details: Assist for balance and support. Pt with good adherence to NWB.  Pt fatigues quickly with NWB.   Stairs            Wheelchair Mobility    Modified Rankin (Stroke Patients Only)       Balance Overall balance assessment: Needs assistance Sitting-balance support: No upper extremity supported;Feet supported Sitting balance-Leahy Scale: Good     Standing balance support: Bilateral upper extremity supported Standing balance-Leahy Scale: Poor Standing balance comment: support of walker and min guard A for static standing due to having to maintain NWB.                    Cognition Arousal/Alertness: Awake/alert Behavior During Therapy: WFL for tasks assessed/performed Overall Cognitive Status: Within Functional Limits for tasks assessed                      Exercises      General Comments        Pertinent Vitals/Pain Pain Assessment: 0-10 Pain Score: 1  Pain Location: rt foot Pain Descriptors / Indicators: Sore Pain Intervention(s): Monitored during session    Home Living                      Prior Function            PT Goals (current goals can now be found in the care plan section) Progress towards PT goals: Progressing toward goals    Frequency  Min 3X/week    PT Plan      Co-evaluation             End of Session Equipment Utilized During Treatment: Gait belt Activity Tolerance: Patient tolerated treatment well Patient left: with call bell/phone within reach;in chair;with chair  alarm set     Time: [phone] PT Time Calculation (min) (ACUTE ONLY): 22 min  Charges:  $Gait Training: 8-22 mins                    G Codes:      Emilina Smarr 08-16-2016, 11:10 AM Suanne Marker PT [phone]

## 2016-07-26 NOTE — Progress Notes (Signed)
ANTICOAGULATION CONSULT NOTE - Follow Up Consult  Pharmacy Consult for heparin Indication: CAD awaiting possible PCI  Labs:  Recent Labs  07/24/16 0359 07/24/16 1311 07/25/16 0253 07/25/16 1350 07/25/16 2200 07/26/16 0349 07/26/16 0350  HGB 9.8*  --  9.8*  --   --  9.5*  --   HCT 31.0*  --  31.6*  --   --  30.2*  --   PLT 189  --  218  --   --  243  --   HEPARINUNFRC 0.34  --   --  <0.10* 0.28*  --  0.62  CREATININE  --  1.53* 1.55*  --   --  1.57*  --   CKTOTAL  --  338  --   --   --   --   --     Assessment/Plan:  60yo male therapeutic on heparin after rate change. Will continue gtt at current rate and confirm stable with additional level.   Wynona Neat, PharmD, BCPS  07/26/2016,4:32 AM

## 2016-07-26 NOTE — Consult Note (Signed)
WOC assistance requested to trouble-shoot Prevena negative pressure device which is alarming.  45cc cannister is full of blood-tinged drainage.  Applied new cannister and cont pressure was resumed with a good seal to right foot.  Pt is followed by Dr Sharol Given, please refer to him for further questions.  If Prevana trouble-shooting is required at another time, please contact the [facility] representative, Page Cheron Schaumann [phone]. Please re-consult if further assistance is needed.  Thank-you,  Julien Girt MSN, [place], [place], [place], [place]

## 2016-07-26 NOTE — Progress Notes (Signed)
ANTICOAGULATION CONSULT NOTE - Follow Up Consult  Pharmacy Consult for Heparin Indication: 3V CAD  Not on File  Patient Measurements: Height: 5\' 9"  (175.3 cm) Weight: (!) 319 lb 0.1 oz (144.7 kg) IBW/kg (Calculated) : 70.7  Heparin dosing weight: 105kg  Vital Signs: Temp: 99.4 F (37.4 C) (08/14 0337) Temp Source: Oral (08/14 0337) BP: 164/88 (08/14 1003) Pulse Rate: 87 (08/14 1004)  Labs:  Recent Labs  07/24/16 0359 07/24/16 1311 07/25/16 0253 07/25/16 1350 07/25/16 2200 07/26/16 0349 07/26/16 0350  HGB 9.8*  --  9.8*  --   --  9.5*  --   HCT 31.0*  --  31.6*  --   --  30.2*  --   PLT 189  --  218  --   --  243  --   HEPARINUNFRC 0.34  --   --  <0.10* 0.28*  --  0.62  CREATININE  --  1.53* 1.55*  --   --  1.57*  --   CKTOTAL  --  338  --   --   --   --   --     Estimated Creatinine Clearance: 73.6 mL/min (by C-G formula based on SCr of 1.57 mg/dL).  Assessment: 60 yo male s/p cath which showed 3V CAD. Pt refused CABG and for PCI eventually. MRI of foot consistent with osteo and abscess, so delaying PCI and now s/p transmetatarsal amputation. Possible PCI 8/16-8/17.  HL now supratherapeutic (0.98) on 2500 units/h. Appears to have been drawn correctly. Hg low stable, plt wnl, no bleed or IV line issues noted per RN.  Goal of Therapy:  Heparin level 0.3-0.7 units/ml Monitor platelets by anticoagulation protocol: Yes   Plan:  - Decrease heparin to 2250 units/hr - 6 hour HL, Daily HL, CBC - Monitor s/sx of bleeding - Possible PCI at some point  Elicia Lamp, PharmD, [facility] Clinical Pharmacist Pager [phone] 07/26/2016 10:21 AM

## 2016-07-26 NOTE — Progress Notes (Addendum)
Occupational Therapy Evaluation Patient Details Name: Joshua [name] MRN: [id number] DOB: [birth year] Today's Date: 07/26/2016    History of Present Illness Patient is a 60 yo male admitted 07/18/16 with unstable angina.  Cath showed 3-vessel disease.  Patient declining CABG.  Plan for PCI cancelled - patient with Rt foot wound with drainage - osteomyelitis.  Patient s/p Rt transmetatarsal amputation 07/24/16.  PCI still pending.    PMH:  CAD, MI, cardiac stents, DM, neuropathy, gastroparesis, Rt foot recurrent ulcers, CHF, HTN, morbid obesity, OSA on CPAP   Clinical Impression   PTA, pt mod I with mobility and ADL. Pt making good progress. Feel pt is good CIR candidate to maximize functional level of independence to facilitate safe return home with 24/7 S. Very motivated to return to PLOF. Will follow acutely to address established goals.     Follow Up Recommendations  CIR;Supervision/Assistance - 24 hour    Equipment Recommendations  Tub/shower bench;Wheelchair (measurements OT);Wheelchair cushion (measurements OT)    Recommendations for Other Services Rehab consult     Precautions / Restrictions Precautions Precautions: Fall Restrictions Weight Bearing Restrictions: Yes RLE Weight Bearing: Non weight bearing      Mobility Bed Mobility               General bed mobility comments: OOB  Transfers Overall transfer level: Needs assistance Equipment used: Rolling walker (2 wheeled) Transfers: Sit to/from [employer] Sit to Stand: Min assist Stand pivot transfers: Min assist;+2 physical assistance            Balance     Sitting balance-Leahy Scale: Good       Standing balance-Leahy Scale: Poor                              ADL Overall ADL's : Needs assistance/impaired     Grooming: Set up   Upper Body Bathing: Set up;Sitting   Lower Body Bathing: Moderate assistance;Sit to/from stand   Upper Body Dressing : Set up;Sitting    Lower Body Dressing: Moderate assistance;Sit to/from stand   Toilet Transfer: +2 for physical assistance;Minimal assistance;Stand-pivot   Toileting- Clothing Manipulation and Hygiene: Minimal assistance;+2 for safety/equipment       Functional mobility during ADLs: Minimal assistance;+2 for physical assistance (only sit - stand/pivot.. Not ambulating at this time)  Easily SOB with minimal activity     Vision     Perception     Praxis      Pertinent Vitals/Pain Pain Assessment: 0-10 Pain Score: 2  Pain Location: R foot Pain Descriptors / Indicators: Discomfort;Sore Pain Intervention(s): Limited activity within patient's tolerance     Hand Dominance Right   Extremity/Trunk Assessment Upper Extremity Assessment Upper Extremity Assessment:  (B shoulder issues but functional)   Lower Extremity Assessment Lower Extremity Assessment: RLE deficits/detail (s/p surgery) LLE Sensation: history of peripheral neuropathy   Cervical / Trunk Assessment Cervical / Trunk Assessment: Other exceptions Cervical / Trunk Exceptions: lower back problems for @ 8 months.    Communication Communication Communication: No difficulties   Cognition Arousal/Alertness: Awake/alert Behavior During Therapy: WFL for tasks assessed/performed Overall Cognitive Status: Within Functional Limits for tasks assessed                     General Comments       Exercises Exercises: Other exercises Other Exercises Other Exercises: encouraged BUE AROM Other Exercises: chair push ups   Shoulder Instructions  Home Living Family/patient expects to be discharged to:: Private residence Living Arrangements: Spouse/significant other;Children Available Help at Discharge:  (adult children can be home during the day.) Type of Home: House Home Access: Stairs to enter [employer] of Steps: 2 Entrance Stairs-Rails: None Home Layout:  (has 1 step in home to get to bedroom /bathroom)      Bathroom Shower/Tub: Tub/shower unit;Curtain Shower/tub characteristics: Architectural technologist: Handicapped height     Home Equipment: Crutches          Prior Functioning/Environment Level of Independence: Independent        Comments:  (from [place])    OT Diagnosis: Generalized weakness;Acute pain   OT Problem List: Decreased strength;Decreased range of motion;Decreased activity tolerance;Impaired balance (sitting and/or standing);Decreased knowledge of use of DME or AE;Decreased knowledge of precautions;Cardiopulmonary status limiting activity;Impaired sensation;Obesity;Pain   OT Treatment/Interventions: Self-care/ADL training;Therapeutic exercise;Energy conservation;DME and/or AE instruction;Therapeutic activities;Patient/family education;Balance training    OT Goals(Current goals can be found in the care plan section) Acute Rehab OT Goals Patient Stated Goal: to be independent again OT Goal Formulation: With patient Time For Goal Achievement: 08/09/16 Potential to Achieve Goals: Good ADL Goals Pt Will Perform Lower Body Bathing: with set-up;with supervision;sit to/from stand Pt Will Perform Lower Body Dressing: with set-up;with supervision;sit to/from stand Pt Will Transfer to Toilet: with supervision;ambulating;bedside commode Pt Will Perform Toileting - Clothing Manipulation and hygiene: with supervision;sitting/lateral leans;with adaptive equipment Pt/caregiver will Perform Home Exercise Program: Increased strength;Both right and left upper extremity;With theraband  OT Frequency: Min 2X/week   Barriers to D/C:            Co-evaluation              End of Session Equipment Utilized During Treatment: Gait belt;Rolling walker Nurse Communication: Mobility status  Activity Tolerance: Patient tolerated treatment well Patient left: in chair;with call bell/phone within reach;with chair alarm set   Time: 1431-1455 OT Time Calculation (min): 24  min Charges:  OT General Charges $OT Visit: 1 Procedure OT Evaluation $OT Eval Moderate Complexity: 1 Procedure OT Treatments $Self Care/Home Management : 8-22 mins G-Codes:    Mauricia Mertens,HILLARY 08/08/16, 4:11 PM   [facility], OTR/L  [phone] 08-08-16

## 2016-07-26 NOTE — Progress Notes (Signed)
ANTICOAGULATION CONSULT NOTE - Follow Up Consult  Pharmacy Consult for Heparin Indication: 3V CAD  Not on File  Patient Measurements: Height: 5\' 9"  (175.3 cm) Weight: (!) 319 lb 0.1 oz (144.7 kg) IBW/kg (Calculated) : 70.7  Heparin dosing weight: 105kg  Vital Signs: Temp: 98.5 F (36.9 C) (08/14 1352) Temp Source: Oral (08/14 1352) BP: 133/75 (08/14 1352) Pulse Rate: 79 (08/14 1352)  Labs:  Recent Labs  07/24/16 0359 07/24/16 1311 07/25/16 0253  07/26/16 0349 07/26/16 0350 07/26/16 1140 07/26/16 1854  HGB 9.8*  --  9.8*  --  9.5*  --   --   --   HCT 31.0*  --  31.6*  --  30.2*  --   --   --   PLT 189  --  218  --  243  --   --   --   HEPARINUNFRC 0.34  --   --   < >  --  0.62 0.98* 0.84*  CREATININE  --  1.53* 1.55*  --  1.57*  --   --   --   CKTOTAL  --  338  --   --   --   --   --   --   < > = values in this interval not displayed.  Estimated Creatinine Clearance: 73.6 mL/min (by C-G formula based on SCr of 1.57 mg/dL).  Assessment: 60 yo male s/p cath which showed 3V CAD. Pt refused CABG and for PCI eventually. MRI of foot consistent with osteo and abscess, so delaying PCI and now s/p transmetatarsal amputation. Possible PCI 8/16-8/17.  Heparin level still slightly above goal this evening.  No bleeding or complications noted per chart notes.  Goal of Therapy:  Heparin level 0.3-0.7 units/ml Monitor platelets by anticoagulation protocol: Yes   Plan:  - Decrease heparin to 2100 units/hr - Recheck heparin level with morning labs. - Monitor s/sx of bleeding - Possible PCI at some point  [name], BCPS  Clinical Pharmacist Pager [phone]  07/26/2016 8:05 PM

## 2016-07-27 ENCOUNTER — Encounter (HOSPITAL_COMMUNITY)
Admission: EM | Disposition: A | Discharge status: Rehabilitation Facility | Payer: Self-pay | Source: Home / Self Care | Attending: Cardiovascular Disease

## 2016-07-27 LAB — CBC
HCT: 30.3 % — ABNORMAL LOW (ref 39.0–52.0)
Hemoglobin: 9.6 g/dL — ABNORMAL LOW (ref 13.0–17.0)
MCH: 27.2 pg (ref 26.0–34.0)
MCHC: 31.7 g/dL (ref 30.0–36.0)
MCV: 85.8 fL (ref 78.0–100.0)
Platelets: 254 10*3/uL (ref 150–400)
RBC: 3.53 MIL/uL — ABNORMAL LOW (ref 4.22–5.81)
RDW: 14.6 % (ref 11.5–15.5)
WBC: 10.6 10*3/uL — ABNORMAL HIGH (ref 4.0–10.5)

## 2016-07-27 LAB — BASIC METABOLIC PANEL
Anion gap: 9 (ref 5–15)
BUN: 12 mg/dL (ref 6–20)
CO2: 23 mmol/L (ref 22–32)
Calcium: 8.7 mg/dL — ABNORMAL LOW (ref 8.9–10.3)
Chloride: 105 mmol/L (ref 101–111)
Creatinine, Ser: 1.66 mg/dL — ABNORMAL HIGH (ref 0.61–1.24)
GFR calc Af Amer: 51 mL/min — ABNORMAL LOW (ref >60)
GFR calc non Af Amer: 44 mL/min — ABNORMAL LOW (ref >60)
Glucose, Bld: 171 mg/dL — ABNORMAL HIGH (ref 65–99)
Potassium: 3.7 mmol/L (ref 3.5–5.1)
Sodium: 137 mmol/L (ref 135–145)

## 2016-07-27 LAB — GLUCOSE, CAPILLARY
Glucose-Capillary: 148 mg/dL — ABNORMAL HIGH (ref 65–99)
Glucose-Capillary: 157 mg/dL — ABNORMAL HIGH (ref 65–99)
Glucose-Capillary: 151 mg/dL — ABNORMAL HIGH (ref 65–99)
Glucose-Capillary: 186 mg/dL — ABNORMAL HIGH (ref 65–99)

## 2016-07-27 LAB — HEPARIN LEVEL (UNFRACTIONATED)
Heparin Unfractionated: 0.8 IU/mL — ABNORMAL HIGH (ref 0.30–0.70)
Heparin Unfractionated: 0.67 IU/mL (ref 0.30–0.70)

## 2016-07-27 SURGERY — CORONARY ARTERY BYPASS GRAFTING (CABG)
Anesthesia: General | Site: Chest

## 2016-07-27 MED ORDER — SODIUM CHLORIDE 0.9% FLUSH
3.0000 mL | INTRAVENOUS | Status: DC | PRN
Start: 2016-07-27 — End: 2016-07-29

## 2016-07-27 MED ORDER — SODIUM CHLORIDE 0.9 % WEIGHT BASED INFUSION
1.0000 mL/kg/h | INTRAVENOUS | Status: DC
  Administered 2016-07-28 (×3): 1 mL/kg/h via INTRAVENOUS

## 2016-07-27 MED ORDER — SODIUM CHLORIDE 0.9 % IV SOLN: 250.0000 mL | INTRAVENOUS | Status: DC | PRN

## 2016-07-27 MED ORDER — SODIUM CHLORIDE 0.9% FLUSH
3.0000 mL | Freq: Two times a day (BID) | INTRAVENOUS | Status: DC
  Administered 2016-07-27: 3 mL via INTRAVENOUS

## 2016-07-27 MED ORDER — SODIUM CHLORIDE 0.9 % WEIGHT BASED INFUSION
3.0000 mL/kg/h | INTRAVENOUS | Status: AC
  Administered 2016-07-28: 3 mL/kg/h via INTRAVENOUS

## 2016-07-27 MED ORDER — ASPIRIN 81 MG PO CHEW
81.0000 mg | CHEWABLE_TABLET | ORAL | Status: AC
  Administered 2016-07-28: 81 mg via ORAL
  Filled 2016-07-27: qty 1

## 2016-07-27 NOTE — Clinical Social Work Note (Signed)
Clinical Social Work Assessment  Patient Details  Name: Joshua [name] MRN: [id number] Date of Birth: 1956/10/09  Date of referral:                  Reason for consult:  Discharge Planning                Permission sought to share information with:  Facility Art therapist granted to share information::  Yes, Verbal Permission Granted  Name::        Agency::  SNFs  Relationship::     Contact Information:     Housing/Transportation Living arrangements for the past 2 months:  Single Family Home Source of Information:  Patient Patient Interpreter Needed:  None Criminal Activity/Legal Involvement Pertinent to Current Situation/Hospitalization:  No - Comment as needed Significant Relationships:  Spouse Lives with:  Spouse Do you feel safe going back to the place where you live?  Yes Need for family participation in patient care:  No (Coment)  Care giving concerns:  The patient would like inpatient rehab. Patient understands the need of alternate plan of SNF placement if not accepted into inpatient rehab.    Social Worker assessment / plan:  CSW met with patient at beside to complete assessment. Patient was resting comfortably in bed. CSW explained PT recommendation for CIR placement. CSW explained that an alternate plan of SNFs placement may be needed.  CSW explained SNF search and placement process to the patient and answered his questions. The patient requested  CSW to pursue alternate plan of SNFs placement. CSW will follow up with bed offers.  Employment status:  Therapist, music:  Managed Care PT Recommendations:  [place] / Referral to community resources:  [place]  Patient/Family's Response to care: The patient appears happy with the care he is receiving in hospital and is appreciative of CSW assistance.  Patient/Family's Understanding of and Emotional Response to Diagnosis, Current Treatment, and  Prognosis:  The patient has a good understanding of why he was admitted. He understands the care plan and what he will need post discharge.  Emotional Assessment Appearance:  Appears stated age Attitude/Demeanor/Rapport:   (Patient was appropriate. ) Affect (typically observed):  Accepting, Calm, Appropriate Orientation:  Oriented to Self, Oriented to Place, Oriented to  Time, Oriented to Situation Alcohol / Substance use:  Not Applicable Psych involvement (Current and /or in the community):  No (Comment)  Discharge Needs  Concerns to be addressed:  Discharge Planning Concerns Readmission within the last 30 days:  No Current discharge risk:  Physical Impairment Barriers to Discharge:  Continued Medical Work up   [employer], LCSW 07/27/2016, 3:38 PM

## 2016-07-27 NOTE — Progress Notes (Signed)
Subjective:  No chest pain; feels well  Objective:   Vital Signs : Vitals:   07/26/16 2205 07/27/16 0428 07/27/16 0500 07/27/16 0948  BP: 135/78 128/74  128/72  Pulse: 81 87  85  Resp: _0 Temp: 98.2 F (36.8 C) 98 F (36.7 C)  98.5 F (36.9 C)  TempSrc: Oral Oral  Oral  SpO2: 93% 93%  98%  Weight:   (!) 319 lb 10.7 oz (145 kg)   Height:        Intake/Output from previous day:  Intake/Output Summary (Last 24 hours) at 07/27/16 1033 Last data filed at 07/27/16 1031  Gross per 24 hour  Intake           650.03 ml  Output             1550 ml  Net          -899.97 ml    I/O since admission: -3509  Wt Readings from Last 3 Encounters:  07/27/16 (!) 319 lb 10.7 oz (145 kg)  02/16/16 297 lb (134.7 kg)  10/14/15 279 lb 14.4 oz (127 kg)    Medications: . allopurinol  300 mg Oral Daily  . amLODipine  10 mg Oral Daily  . antiseptic oral rinse  7 mL Mouth Rinse BID  . aspirin  81 mg Oral Daily  . ceFTAROline (TEFLARO) IV  600 mg Intravenous Q12H  . doxazosin  8 mg Oral Daily  . furosemide  40 mg Oral Daily  . hydrALAZINE  37.5 mg Oral BID  . insulin aspart  0-20 Units Subcutaneous TID WC  . insulin NPH Human  22 Units Subcutaneous QHS   And  . insulin NPH Human  20 Units Subcutaneous QAC breakfast  . isosorbide mononitrate  60 mg Oral Daily  . metoprolol succinate  75 mg Oral Daily  . pantoprazole  40 mg Oral Daily  . potassium chloride  20 mEq Oral Daily  . rosuvastatin  40 mg Oral q1800  . sodium chloride flush  3 mL Intravenous Q12H  . ticagrelor  90 mg Oral BID    . sodium chloride 10 mL (07/24/16 1118)  . heparin 1,900 Units/hr (07/27/16 0414)  . nitroGLYCERIN 15 mcg/min (07/27/16 0215)    Physical Exam:   General appearance: alert, cooperative, no distress and morbidly obese Neck: no adenopathy, no JVD, supple, symmetrical, trachea midline and thyroid not enlarged, symmetric, no tenderness/mass/nodules Lungs: clear to auscultation  bilaterally Heart: regular rate and rhythm and 1/6 Abdomen: soft, non-tender; bowel sounds normal; no masses,  no organomegaly Extremities: R MT amputation Neurologic: Grossly normal   Rate: 85  Rhythm: NSR  ECG (independently read by me): NSR at 81; PRWP anteriorly  ECG (independently read by me): ST at 118    Lab Results:   Recent Labs  07/25/16 0253 07/26/16 0349 07/27/16 0244  NA 139 138 137  K 3.7 3.4* 3.7  CL 105 107 105  CO2 _1 GLUCOSE 160* 197* 171*  BUN _2 CREATININE 1.55* 1.57* 1.66*  CALCIUM 8.7* 8.6* 8.7*    Hepatic Function Latest Ref Rng & Units 02/16/2016 10/13/2015 07/03/2014  Total Protein 6.1 - 8.1 g/dL 6.9 7.1 6.6  Albumin 3.6 - 5.1 g/dL 3.5(L) 3.6 2.8(L)  AST 10 - 35 U/L _3 ALT 9 - 46 U/L _4 Alk Phosphatase 40 - 115 U/L 131(H) 112 120(H)  Total Bilirubin 0.2 - 1.2 mg/dL  0.4 0.3 <0.2(L)     Recent Labs  07/25/16 0253 07/26/16 0349 07/27/16 0244  WBC 9.4 9.9 10.6*  HGB 9.8* 9.5* 9.6*  HCT 31.6* 30.2* 30.3*  MCV 88.3 85.8 85.8  PLT 218 243 254    No results for input(s): TROPONINI in the last 72 hours.  Invalid input(s): CK, MB  Lab Results  Component Value Date   TSH 3.175 07/18/2016   No results for input(s): HGBA1C in the last 72 hours.  No results for input(s): PROT, ALBUMIN, AST, ALT, ALKPHOS, BILITOT, BILIDIR, IBILI in the last 72 hours. No results for input(s): INR in the last 72 hours. BNP (last 3 results)  Recent Labs  07/29/15 0354  BNP 148.7*    ProBNP (last 3 results) No results for input(s): PROBNP in the last 8760 hours.   Lipid Panel     Component Value Date/Time   CHOL 205 (H) 02/16/2016 0959   TRIG 101 02/16/2016 0959   HDL 102 02/16/2016 0959   CHOLHDL 2.0 02/16/2016 0959   VLDL 20 02/16/2016 0959   LDLCALC 83 02/16/2016 0959     Imaging:  No results found.    Assessment/Plan:   Principal Problem:   Unstable angina pectoris (HCC) Active Problems:   CAD in  native artery   Chronic combined systolic and diastolic CHF (congestive heart failure) (HCC)   Coronary artery disease   Hyperlipemia   Renal insufficiency   Penetrating wound of right foot   Type 1 diabetes mellitus with right diabetic foot ulcer (HCC)   Chronic osteomyelitis of toe of right foot (HCC)   CKD (chronic kidney disease) stage 3, GFR 30-59 ml/min   Chronic osteomyelitis of right foot (HCC)   Cellulitis and abscess of leg, except foot  1. CAD with 3 vessel disease;  Plan for PCI to LAD and LCx, deferred from last week due to fever and osteomyelitis. Per ID since pt is s/p amputation and on antibiotics may not need to wait for antibiotic completion. Tolerating Toprol at 75 mg, will not increase today but can further titrate as needed.  I have scheduled pt for 2 vessel PCI tomorrow with Dr. Tamala Julian who had done his diagnostic study.  2. Chronic combined systolic/diastolic Heart failure; currently compensated.   3. Osteomyelitis abscess of MTP joint and great toe/ s/p R TM amputation; plan for 3 weeks of Teflaro per ID  4.  CKD  Cr 1.66 today  5.  DM    [name], MD, [facility] 07/27/2016, 10:33 AM

## 2016-07-27 NOTE — Progress Notes (Signed)
Physical Therapy Treatment Patient Details Name: Joshua [name] MRN: [id number] DOB: 13-Oct-1956 Today's Date: 07/27/2016    History of Present Illness Patient is a 60 yo male admitted 07/18/16 with unstable angina.  Cath showed 3-vessel disease.  Patient declining CABG.  Plan for PCI cancelled - patient with Rt foot wound with drainage - osteomyelitis.  Patient s/p Rt transmetatarsal amputation 07/24/16.  PCI still pending.    PMH:  CAD, MI, cardiac stents, DM, neuropathy, gastroparesis, Rt foot recurrent ulcers, CHF, HTN, morbid obesity, OSA on CPAP    PT Comments    Pt is up to chair and tired but agreed to doing his leg exercises with care to avoid stressing vac seal.  He is NWB on RLE during exercises that utilized pressure on posterior R calf.  He is going to receive a portable unit for home, planning to transition to CIR if accepted.  Follow Up Recommendations  CIR     Equipment Recommendations  Rolling walker with 5" wheels;3in1 (PT)    Recommendations for Other Services Rehab consult     Precautions / Restrictions Precautions Precautions: Fall Precaution Comments: Patient with DOE awaiting PCI.  New transmetatarsal amputation. Stints scheduled 8/16 Restrictions Weight Bearing Restrictions: Yes RLE Weight Bearing: Non weight bearing    Mobility  Bed Mobility Overal bed mobility: Modified Independent             General bed mobility comments: OOB when PT arrived  Transfers Overall transfer level: Needs assistance   Transfers: Sit to/from Stand;Stand Pivot Transfers Sit to Stand: Min assist Stand pivot transfers: Min assist          Ambulation/Gait             General Gait Details: declined   Stairs            Wheelchair Mobility    Modified Rankin (Stroke Patients Only)       Balance Overall balance assessment: Needs assistance Sitting-balance support: Feet supported;Bilateral upper extremity supported Sitting balance-Leahy Scale:  Good       Standing balance-Leahy Scale: Fair                      Cognition Arousal/Alertness: Awake/alert Behavior During Therapy: WFL for tasks assessed/performed Overall Cognitive Status: Within Functional Limits for tasks assessed                      Exercises General Exercises - Lower Extremity Ankle Circles/Pumps: AROM;Both;5 reps Quad Sets: AROM;Both;20 reps Gluteal Sets: AROM;Both;15 reps Heel Slides: AAROM;AROM;Both;10 reps Hip ABduction/ADduction: AROM;AAROM;Both;20 reps Straight Leg Raises: AROM;AAROM;Both;10 reps Hip Flexion/Marching: AROM;Both;10 reps Other Exercises Other Exercises: BUE therabanc exercises    General Comments General comments (skin integrity, edema, etc.): Pt had gotten up with OT to chair and declined to be up to stand, stating "I can barely stand"      Pertinent Vitals/Pain Pain Assessment: 0-10 Pain Score: 2  Pain Location: R foot Pain Descriptors / Indicators: Sore Pain Intervention(s): Limited activity within patient's tolerance;Monitored during session;Premedicated before session;Repositioned    Home Living                      Prior Function            PT Goals (current goals can now be found in the care plan section) Acute Rehab PT Goals Patient Stated Goal: to be independent again Progress towards PT goals: Progressing toward goals    Frequency  Min 3X/week    PT Plan Current plan remains appropriate    Co-evaluation             End of Session   Activity Tolerance: Patient tolerated treatment well Patient left: with call bell/phone within reach;in chair;with chair alarm set     Time: 0867-6195 PT Time Calculation (min) (ACUTE ONLY): 21 min  Charges:  $Therapeutic Exercise: 8-22 mins                    G Codes:      Ramond Dial 08/25/2016, 2:28 PM    Mee Hives, PT MS Acute Rehab Dept. Number: [place]

## 2016-07-27 NOTE — Progress Notes (Signed)
PROGRESS NOTE  ALMIR BOTTS AYT:[id number] DOB: 21-Nov-1956 DOA: 07/18/2016 PCP: [name], MD  HPI/Recap of past 26 hours: 60year old male with CAD s/p LAD stent admitted with unstable angina by cardiology service, plan for PCI held off due to right foot infection for which he has now had TM amputation on 8/12 with Dr. Sharol Given. ID and TRH were consulted as well. ID plans empiric Teflaro for 3 weeks, and cardiology will decide this coming week on PCI, likely stenting as patient has declined CABG.  Today he continues to be comfortable, no complaints.  Assessment/Plan: Principal Problem:   Unstable angina pectoris (HCC) and CAD in native artery - stable, no complaints, on telemetry, per cardiology team who are primary. They plan PCI this week.   Chronic combined systolic and diastolic CHF (congestive heart failure) (HCC)   Coronary artery disease   Hyperlipemia   Renal insufficiency - follow daily, note he is on lasix $Remove'40mg'gxpnwjw$  PO daily. His creatinine has remain essentially unchanged despite fluid administration, and he does have some peripheral edema currently.   Penetrating wound of right foot - now s/p TM amputation with Dr. Sharol Given on 8/12, wound vac in place. ID recommends 3 weeks Teflaro with weeks labs and ID follow up prior to completion of antibiotics. Patient will need a PICC line placed but will need to d/w Cardiology before placing, to ensure we don't interfere with PCI.   Type 1 diabetes mellitus with right diabetic foot ulcer (HCC)   Chronic osteomyelitis of toe of right foot (HCC) - s/p TM amputation, abx per ID   CKD (chronic kidney disease) stage 3, GFR 30-59 ml/min  Code Status: FULL   Family Communication: None present at bedside this AM.   Disposition Plan: Likely rehab vs home.  Consultants:  Primary team is Cardology  ID (signed off 8/13, but will need outpt f/u), TRH and Ortho are consulting.   Procedures:  Cardiac cath 8/7  TM amputation right foot  8/12   Antimicrobials:  Dapto 8/12-8/13  Rocephin 8/12 - 8/13  Teflaro 8/13 for planned 3 weeks   Objective: Vitals:   07/26/16 1352 07/26/16 2205 07/27/16 0428 07/27/16 0500  BP: 133/75 135/78 128/74   Pulse: 79 81 87   Resp: $Remo'18 18 20   'CAmCV$ Temp: 98.5 F (36.9 C) 98.2 F (36.8 C) 98 F (36.7 C)   TempSrc: Oral Oral Oral   SpO2: 93% 93% 93%   Weight:    (!) 145 kg (319 lb 10.7 oz)  Height:        Intake/Output Summary (Last 24 hours) at 07/27/16 0848 Last data filed at 07/27/16 0833  Gross per 24 hour  Intake           410.03 ml  Output              850 ml  Net          -439.97 ml   Filed Weights   07/22/16 0553 07/26/16 0534 07/27/16 0500  Weight: (!) 144.7 kg (319 lb 1.6 oz) (!) 144.7 kg (319 lb 0.1 oz) (!) 145 kg (319 lb 10.7 oz)    Exam: General:  Alert, oriented, calm, in no acute distress Eyes: pupils round and reactive to light and accomodation, clear sclerea Neck: supple, no masses, trachea mildline  Cardiovascular: RRR, no murmurs or rubs, 1+ peripheral edema in left foot Respiratory: clear to auscultation bilaterally, no wheezes, no crackles  Abdomen: soft, nontender, nondistended, normal bowel tones heard  Skin:  dry, no rashes  Musculoskeletal: no joint effusions, normal range of motion, R forefoot with vac Psychiatric: appropriate affect, normal speech  Neurologic: extraocular muscles intact, clear speech, moving all extremities with intact sensorium    Data Reviewed: CBC:  Recent Labs Lab 07/23/16 0249 07/24/16 0359 07/25/16 0253 07/26/16 0349 07/27/16 0244  WBC 10.3 7.7 9.4 9.9 10.6*  HGB 10.8* 9.8* 9.8* 9.5* 9.6*  HCT 33.9* 31.0* 31.6* 30.2* 30.3*  MCV 86.7 86.1 88.3 85.8 85.8  PLT 183 189 218 243 568   Basic Metabolic Panel:  Recent Labs Lab 07/23/16 0445 07/24/16 1311 07/25/16 0253 07/26/16 0349 07/27/16 0244  NA 136 135 139 138 137  K 3.7 3.8 3.7 3.4* 3.7  CL 104 106 105 107 105  CO2 20* 21* $Remov'22 22 23  'louEgb$ GLUCOSE 175* 187*  160* 197* 171*  BUN $Re'17 18 16 12 12  'ler$ CREATININE 1.51* 1.53* 1.55* 1.57* 1.66*  CALCIUM 8.6* 8.4* 8.7* 8.6* 8.7*   GFR: Estimated Creatinine Clearance: 69.7 mL/min (by C-G formula based on SCr of 1.66 mg/dL). Liver Function Tests: No results for input(s): AST, ALT, ALKPHOS, BILITOT, PROT, ALBUMIN in the last 168 hours. No results for input(s): LIPASE, AMYLASE in the last 168 hours. No results for input(s): AMMONIA in the last 168 hours. Coagulation Profile: No results for input(s): INR, PROTIME in the last 168 hours. Cardiac Enzymes:  Recent Labs Lab 07/24/16 1311  CKTOTAL 338   BNP (last 3 results) No results for input(s): PROBNP in the last 8760 hours. HbA1C: No results for input(s): HGBA1C in the last 72 hours. CBG:  Recent Labs Lab 07/26/16 0620 07/26/16 1111 07/26/16 1634 07/26/16 2155 07/27/16 0613  GLUCAP 173* 226* 205* 136* 148*   Lipid Profile: No results for input(s): CHOL, HDL, LDLCALC, TRIG, CHOLHDL, LDLDIRECT in the last 72 hours. Thyroid Function Tests: No results for input(s): TSH, T4TOTAL, FREET4, T3FREE, THYROIDAB in the last 72 hours. Anemia Panel: No results for input(s): VITAMINB12, FOLATE, FERRITIN, TIBC, IRON, RETICCTPCT in the last 72 hours. Urine analysis:    Component Value Date/Time   COLORURINE YELLOW 07/22/2016 0033   APPEARANCEUR CLOUDY (A) 07/22/2016 0033   LABSPEC 1.021 07/22/2016 0033   PHURINE 5.0 07/22/2016 0033   GLUCOSEU NEGATIVE 07/22/2016 0033   HGBUR SMALL (A) 07/22/2016 0033   BILIRUBINUR NEGATIVE 07/22/2016 0033   KETONESUR NEGATIVE 07/22/2016 0033   PROTEINUR >300 (A) 07/22/2016 0033   UROBILINOGEN 0.2 11/01/2011 1410   NITRITE NEGATIVE 07/22/2016 0033   LEUKOCYTESUR NEGATIVE 07/22/2016 0033   Sepsis Labs: $RemoveBefo'@LABRCNTIP'CvWCGZhKPjk$ (procalcitonin:4,lacticidven:4)  ) Recent Results (from the past 240 hour(s))  Culture, blood (routine x 2)     Status: None   Collection Time: 07/21/16 10:40 PM  Result Value Ref Range Status    Specimen Description BLOOD LEFT ARM  Final   Special Requests BOTTLES DRAWN AEROBIC AND ANAEROBIC 10ML  Final   Culture NO GROWTH 5 DAYS  Final   Report Status 07/26/2016 FINAL  Final  Culture, blood (routine x 2)     Status: None   Collection Time: 07/21/16 10:48 PM  Result Value Ref Range Status   Specimen Description BLOOD LEFT HAND  Final   Special Requests BOTTLES DRAWN AEROBIC AND ANAEROBIC 10ML  Final   Culture NO GROWTH 5 DAYS  Final   Report Status 07/26/2016 FINAL  Final  Culture, Urine     Status: None   Collection Time: 07/22/16 12:33 AM  Result Value Ref Range Status   Specimen Description URINE, CLEAN CATCH  Final   Special Requests NONE  Final   Culture NO GROWTH  Final   Report Status 07/23/2016 FINAL  Final  Surgical PCR screen     Status: None   Collection Time: 07/23/16  8:11 PM  Result Value Ref Range Status   MRSA, PCR NEGATIVE NEGATIVE Final   Staphylococcus aureus NEGATIVE NEGATIVE Final    Comment:        The Xpert SA Assay (FDA approved for NASAL specimens in patients over 50 years of age), is one component of a comprehensive surveillance program.  Test performance has been validated by [facility] for patients greater than or equal to 27 year old. It is not intended to diagnose infection nor to guide or monitor treatment.       Studies: No results found.  Scheduled Meds: . allopurinol  300 mg Oral Daily  . amLODipine  10 mg Oral Daily  . antiseptic oral rinse  7 mL Mouth Rinse BID  . aspirin  81 mg Oral Daily  . ceFTAROline (TEFLARO) IV  600 mg Intravenous Q12H  . doxazosin  8 mg Oral Daily  . furosemide  40 mg Oral Daily  . hydrALAZINE  37.5 mg Oral BID  . insulin aspart  0-20 Units Subcutaneous TID WC  . insulin NPH Human  22 Units Subcutaneous QHS   And  . insulin NPH Human  20 Units Subcutaneous QAC breakfast  . isosorbide mononitrate  60 mg Oral Daily  . metoprolol succinate  75 mg Oral Daily  . pantoprazole  40 mg Oral Daily  .  potassium chloride  20 mEq Oral Daily  . rosuvastatin  40 mg Oral q1800  . sodium chloride flush  3 mL Intravenous Q12H  . ticagrelor  90 mg Oral BID    Continuous Infusions: . sodium chloride 10 mL (07/24/16 1118)  . heparin 1,900 Units/hr (07/27/16 0414)  . nitroGLYCERIN 15 mcg/min (07/27/16 0215)     LOS: 8 days   Time spent: 22 min  Ellorie Kindall Marry Guan, MD [facility] Pager [phone]  If 7PM-7AM, please contact night-coverage [email or web address] Password [facility] 07/27/2016, 8:48 AM

## 2016-07-27 NOTE — Progress Notes (Signed)
ANTICOAGULATION CONSULT NOTE - Follow Up Consult  Pharmacy Consult for Heparin Indication: 3V CAD  Not on File  Patient Measurements: Height: 5\' 9"  (175.3 cm) Weight: (!) 319 lb 10.7 oz (145 kg) IBW/kg (Calculated) : 70.7  Heparin dosing weight: 105kg  Vital Signs: Temp: 98.6 F (37 C) (08/15 1324) Temp Source: Oral (08/15 1324) BP: 137/82 (08/15 1324) Pulse Rate: 84 (08/15 1324)  Labs:  Recent Labs  07/25/16 0253  07/26/16 0349  07/26/16 1854 07/27/16 0244 07/27/16 1520  HGB 9.8*  --  9.5*  --   --  9.6*  --   HCT 31.6*  --  30.2*  --   --  30.3*  --   PLT 218  --  243  --   --  254  --   HEPARINUNFRC  --   < >  --   < > 0.84* 0.80* 0.67  CREATININE 1.55*  --  1.57*  --   --  1.66*  --   < > = values in this interval not displayed.  Estimated Creatinine Clearance: 69.7 mL/min (by C-G formula based on SCr of 1.66 mg/dL).  Assessment: 60 yo male s/p cath which showed 3V CAD. Pt refused CABG and plans for PCI tomorrow.  MRI of foot consistent with osteo and abscess, now s/p transmetatarsal amputation.   Heparin level therapeutic on 1900 units/hr.  Goal of Therapy:  Heparin level 0.3-0.7 units/ml Monitor platelets by anticoagulation protocol: Yes   Plan:  - Continue heparin at 1900 units/hr - F/u AM heparin level and CBC  Gee Habig, Pharm.D., BCPS Clinical Pharmacist Pager [phone] 07/27/2016 4:23 PM

## 2016-07-27 NOTE — Progress Notes (Signed)
Occupational Therapy Treatment Patient Details Name: Joshua [name] MRN: [id number] DOB: [birth year] Today's Date: 07/27/2016    History of present illness Patient is a 60 yo male admitted 07/18/16 with unstable angina.  Cath showed 3-vessel disease.  Patient declining CABG.  Plan for PCI cancelled - patient with Rt foot wound with drainage - osteomyelitis.  Patient s/p Rt transmetatarsal amputation 07/24/16.  PCI still pending.    PMH:  CAD, MI, cardiac stents, DM, neuropathy, gastroparesis, Rt foot recurrent ulcers, CHF, HTN, morbid obesity, OSA on CPAP   OT comments  Pt making progress although continues to have DOE. Began BUE theraband ex program as tolerated. Will contineut o follow acutely to address established goals and facilitate safe D/C home.   Follow Up Recommendations  CIR;Supervision/Assistance - 24 hour    Equipment Recommendations  Tub/shower bench;Wheelchair (measurements OT);Wheelchair cushion (measurements OT)    Recommendations for Other Services Rehab consult    Precautions / Restrictions Precautions Precautions: Fall Precaution Comments: Patient with DOE awaiting PCI.  New transmetatarsal amputation. Stints scheduled 8/16 Restrictions RLE Weight Bearing: Non weight bearing       Mobility Bed Mobility Overal bed mobility: Modified Independent                Transfers     Transfers: Sit to/from Stand;Stand Pivot Transfers Sit to Stand: Min assist Stand pivot transfers: Min assist            Balance Overall balance assessment: Needs assistance           Standing balance-Leahy Scale: Fair                     ADL       Grooming: Set up;Sitting                               Functional mobility during ADLs: Minimal assistance;Rolling walker General ADL Comments: improved ability to complete stand pivot transfer, maintaining NWB status R foot. DOE.      Vision                     Perception     Praxis       Cognition   Behavior During Therapy: WFL for tasks assessed/performed Overall Cognitive Status: Within Functional Limits for tasks assessed                       Extremity/Trunk Assessment               Exercises Other Exercises Other Exercises: BUE therabanc exercises   Shoulder Instructions       General Comments      Pertinent Vitals/ Pain       Pain Assessment: 0-10 Pain Score: 2  Pain Location: R foot Pain Descriptors / Indicators: Discomfort Pain Intervention(s): Limited activity within patient's tolerance;Repositioned  Home Living                                          Prior Functioning/Environment              Frequency Min 2X/week     Progress Toward Goals  OT Goals(current goals can now be found in the care plan section)  Progress towards OT goals: Progressing toward goals  Acute Rehab OT Goals Patient  Stated Goal: to be independent again OT Goal Formulation: With patient Time For Goal Achievement: 08/09/16 Potential to Achieve Goals: Good ADL Goals Pt Will Perform Lower Body Bathing: with set-up;with supervision;sit to/from stand Pt Will Perform Lower Body Dressing: with set-up;with supervision;sit to/from stand Pt Will Transfer to Toilet: with supervision;ambulating;bedside commode Pt Will Perform Toileting - Clothing Manipulation and hygiene: with supervision;sitting/lateral leans;with adaptive equipment Pt/caregiver will Perform Home Exercise Program: Increased strength;Both right and left upper extremity;With theraband  Plan Discharge plan remains appropriate    Co-evaluation                 End of Session Equipment Utilized During Treatment: Gait belt;Rolling walker   Activity Tolerance Patient tolerated treatment well   Patient Left in chair;with call bell/phone within reach;with chair alarm set   Nurse Communication Mobility status        Time: 5852-7782 OT Time Calculation (min): 19  min  Charges: OT General Charges $OT Visit: 1 Procedure OT Treatments $Therapeutic Activity: 8-22 mins  Lorah Kalina,HILLARY 07/27/2016, 2:14 PM   [facility], OTR/L  [phone] 07/27/2016

## 2016-07-27 NOTE — Progress Notes (Signed)
ANTICOAGULATION CONSULT NOTE - Follow Up Consult  Pharmacy Consult for Heparin Indication: 3V CAD  Not on File  Patient Measurements: Height: 5\' 9"  (175.3 cm) Weight: (!) 319 lb 0.1 oz (144.7 kg) IBW/kg (Calculated) : 70.7  Heparin dosing weight: 105kg  Vital Signs: Temp: 98.2 F (36.8 C) (08/14 2205) Temp Source: Oral (08/14 2205) BP: 135/78 (08/14 2205) Pulse Rate: 81 (08/14 2205)  Labs:  Recent Labs  07/24/16 1311  07/25/16 0253  07/26/16 0349  07/26/16 1140 07/26/16 1854 07/27/16 0244  HGB  --   < > 9.8*  --  9.5*  --   --   --  9.6*  HCT  --   --  31.6*  --  30.2*  --   --   --  30.3*  PLT  --   --  218  --  243  --   --   --  254  HEPARINUNFRC  --   --   --   < >  --   < > 0.98* 0.84* 0.80*  CREATININE 1.53*  --  1.55*  --  1.57*  --   --   --   --   CKTOTAL 338  --   --   --   --   --   --   --   --   < > = values in this interval not displayed.  Estimated Creatinine Clearance: 73.6 mL/min (by C-G formula based on SCr of 1.57 mg/dL).  Assessment: 60 yo male s/p cath which showed 3V CAD. Pt refused CABG and for PCI eventually. MRI of foot consistent with osteo and abscess, so delaying PCI and now s/p transmetatarsal amputation. Possible PCI 8/16-8/17.  Heparin level remains above goal on 2100 units/hr (noted trending down with dose decreases).   Goal of Therapy:  Heparin level 0.3-0.7 units/ml Monitor platelets by anticoagulation protocol: Yes   Plan:  - Decrease heparin to 1900 units/hr - F/u 6 hr heparin level  Sherlon Handing, PharmD, BCPS Clinical pharmacist, pager [phone]  07/27/2016 4:09 AM

## 2016-07-27 NOTE — [place] FL2 (Signed)
[place] MEDICAID FL2 LEVEL OF CARE SCREENING TOOL     IDENTIFICATION  Patient Name: Joshua [name] Birthdate: August 05, 1956 Sex: male Admission Date (Current Location): 07/18/2016  [facility] and [place] Number:  Herbalist and Address:  [facility] [place], [place] [address] [place], [place] [address]      Provider Number: [id number]  Attending Physician Name and Address:  [name], MD  Relative Name and Phone Number:       Current Level of Care: Hospital Recommended Level of Care: [place] Prior Approval Number:    Date Approved/Denied:   PASRR Number: [id number]  Discharge Plan: SNF    Current Diagnoses: Patient Active Problem List   Diagnosis Date Noted  . Chronic osteomyelitis of right foot ([place])   . Cellulitis and abscess of leg, except foot   . Penetrating wound of right foot   . Type 1 diabetes mellitus with right diabetic foot ulcer ([place])   . Chronic osteomyelitis of toe of right foot ([place])   . CKD (chronic kidney disease) stage 3, GFR 30-59 ml/min   . Renal insufficiency   . Unstable angina pectoris ([place]) 07/18/2016  . Type II diabetes mellitus ([place])   . Chronic combined systolic and diastolic CHF (congestive heart failure) ([place])   . Coronary artery disease   . Hyperlipemia   . Coronary artery disease involving native coronary artery of native heart without angina pectoris   . Angina pectoris ([place])   . Intermediate coronary syndrome ([place]) 07/03/2014  . Chest pain with moderate risk of acute coronary syndrome 06/24/2014  . CAD in native artery 06/24/2014  . Sleep apnea- on C-pap 06/24/2014  . Morbid obesity ([place]) 11/10/2013  . Uncontrolled diabetes mellitus with peripheral autonomic neuropathy ([place]) 10/29/2013  . Poorly controlled diabetes mellitus ([place]) 10/29/2013  . Hyperglycemia 10/29/2013  . Presence of stent in anterior descending branch of left coronary artery 10/31/2011  . Hyperlipidemia 01/12/2011  .  Essential hypertension 01/12/2011  . GERD 01/12/2011    Orientation RESPIRATION BLADDER Height & Weight     Time, Self, Situation, Place  Normal Continent Weight: (!) 319 lb 10.7 oz (145 kg) Height:  $Remove'5\' 9"'gAuPhUz$  (175.3 cm)  BEHAVIORAL SYMPTOMS/MOOD NEUROLOGICAL BOWEL NUTRITION STATUS   (None)  (none) Continent Diet (Carb modified.)  AMBULATORY STATUS COMMUNICATION OF NEEDS Skin   Limited Assist Verbally Surgical wounds, Wound Vac (Groin RT deep hematoma, Diabetic Ulcer RT great toe, and Wound Vac  RT Foot)                       Personal Care Assistance Level of Assistance  Bathing, Feeding, Dressing Bathing Assistance: Limited assistance Feeding assistance: Independent Dressing Assistance: Limited assistance     Functional Limitations Info  Sight, Hearing, Speech Sight Info: Adequate Hearing Info: Adequate Speech Info: Adequate    SPECIAL CARE FACTORS FREQUENCY  PT (By licensed PT), OT (By licensed OT)     PT Frequency: 5/ week OT Frequency: 5/ week            Contractures Contractures Info: Not present    Additional Factors Info  Code Status, Allergies, Insulin Sliding Scale Code Status Info: Full Allergies Info: NKDA   Insulin Sliding Scale Info: insulin aspart (novoLOG) injection 0-20 Units Dose: 0-20 Units Freq: 3 times daily with meals Route: [place]       Current Medications (07/27/2016):  This is the current hospital active medication list Current Facility-Administered Medications  Medication Dose Route Frequency Provider Last Rate Last Dose  . 0.9 %  sodium chloride infusion  250 mL Intravenous PRN Lyn Records, MD      . 0.9 %  sodium chloride infusion   Intravenous Continuous Nadara Mustard, MD 10 mL/hr at 07/24/16 1118 10 mL at 07/24/16 1118  . acetaminophen (TYLENOL) tablet 650 mg  650 mg Oral Q6H PRN Nadara Mustard, MD       Or  . acetaminophen (TYLENOL) suppository 650 mg  650 mg Rectal Q6H PRN Nadara Mustard, MD      . acetaminophen (TYLENOL) tablet 650  mg  650 mg Oral Q4H PRN Lyn Records, MD   650 mg at 07/22/16 2034  . allopurinol (ZYLOPRIM) tablet 300 mg  300 mg Oral Daily Othella Boyer, MD   300 mg at 07/27/16 0947  . amLODipine (NORVASC) tablet 10 mg  10 mg Oral Daily Lennette Bihari, MD   10 mg at 07/27/16 0947  . antiseptic oral rinse (CPC / CETYLPYRIDINIUM CHLORIDE 0.05%) solution 7 mL  7 mL Mouth Rinse BID Lennette Bihari, MD   7 mL at 07/27/16 1000  . aspirin chewable tablet 81 mg  81 mg Oral Daily Lennette Bihari, MD   81 mg at 07/27/16 0948  . ceftaroline (TEFLARO) 600 mg in sodium chloride 0.9 % 250 mL IVPB  600 mg Intravenous Q12H Randall Hiss, MD   600 mg at 07/27/16 0955  . doxazosin (CARDURA) tablet 8 mg  8 mg Oral Daily Othella Boyer, MD   8 mg at 07/27/16 1050  . furosemide (LASIX) tablet 40 mg  40 mg Oral Daily Mir Vergie Living, MD   40 mg at 07/27/16 0947  . heparin ADULT infusion 100 units/mL (25000 units/213mL sodium chloride 0.45%)  1,900 Units/hr Intravenous Continuous Titus Mould, RPH 19 mL/hr at 07/27/16 0414 1,900 Units/hr at 07/27/16 0414  . hydrALAZINE (APRESOLINE) tablet 37.5 mg  37.5 mg Oral BID Othella Boyer, MD   37.5 mg at 07/27/16 0948  . HYDROmorphone (DILAUDID) injection 1 mg  1 mg Intravenous Q2H PRN Nadara Mustard, MD   1 mg at 07/26/16 2002  . insulin aspart (novoLOG) injection 0-20 Units  0-20 Units Subcutaneous TID WC Othella Boyer, MD   4 Units at 07/27/16 1235  . insulin NPH Human (HUMULIN N,NOVOLIN N) injection 22 Units  22 Units Subcutaneous QHS Mir Vergie Living, MD   22 Units at 07/26/16 2205   And  . insulin NPH Human (HUMULIN N,NOVOLIN N) injection 20 Units  20 Units Subcutaneous QAC breakfast Mir Vergie Living, MD   20 Units at 07/27/16 [phone]  . isosorbide mononitrate (IMDUR) 24 hr tablet 60 mg  60 mg Oral Daily Othella Boyer, MD   60 mg at 07/27/16 0947  . methocarbamol (ROBAXIN) tablet 500 mg  500 mg Oral Q6H PRN Nadara Mustard, MD       Or  .  methocarbamol (ROBAXIN) 500 mg in dextrose 5 % 50 mL IVPB  500 mg Intravenous Q6H PRN Nadara Mustard, MD      . metoCLOPramide (REGLAN) tablet 5-10 mg  5-10 mg Oral Q8H PRN Nadara Mustard, MD       Or  . metoCLOPramide (REGLAN) injection 5-10 mg  5-10 mg Intravenous Q8H PRN Nadara Mustard, MD      . metoprolol succinate (TOPROL-XL) 24 hr tablet 75 mg  75 mg Oral  Daily [name], MD   75 mg at 07/27/16 0948  . nitroGLYCERIN (NITROSTAT) SL tablet 0.4 mg  0.4 mg Sublingual Q5 Min x 3 PRN Jacolyn Reedy, MD      . nitroGLYCERIN 50 mg in dextrose 5 % 250 mL (0.2 mg/mL) infusion  3-30 mcg/min Intravenous Titrated Jacolyn Reedy, MD 4.5 mL/hr at 07/27/16 0215 15 mcg/min at 07/27/16 0215  . ondansetron (ZOFRAN) injection 4 mg  4 mg Intravenous Q6H PRN Belva Crome, MD   4 mg at 07/19/16 2122  . ondansetron (ZOFRAN) tablet 4 mg  4 mg Oral Q6H PRN [name], MD       Or  . ondansetron [facility]) injection 4 mg  4 mg Intravenous Q6H PRN [name], MD      . pantoprazole (PROTONIX) EC tablet 40 mg  40 mg Oral Daily Jacolyn Reedy, MD   40 mg at 07/27/16 0947  . potassium chloride SA (K-DUR,KLOR-CON) CR tablet 20 mEq  20 mEq Oral Daily Mir Marry Guan, MD   20 mEq at 07/27/16 0948  . rosuvastatin (CRESTOR) tablet 40 mg  40 mg Oral q1800 Ihor Austin, RPH   40 mg at 07/26/16 1704  . sodium chloride flush (NS) 0.9 % injection 3 mL  3 mL Intravenous Q12H Belva Crome, MD   3 mL at 07/26/16 1000  . sodium chloride flush (NS) 0.9 % injection 3 mL  3 mL Intravenous PRN Belva Crome, MD      . ticagrelor [facility]) tablet 90 mg  90 mg Oral BID Belva Crome, MD   90 mg at 07/27/16 0300     Discharge Medications: Please see discharge summary for a list of discharge medications.  Relevant Imaging Results:  Relevant Lab Results:   Additional Information SSN: [id number]  Samule Dry, LCSW

## 2016-07-28 ENCOUNTER — Other Ambulatory Visit: Payer: Self-pay | Admitting: Cardiovascular Disease

## 2016-07-28 DIAGNOSIS — N183 Chronic kidney disease, stage 3 (moderate): Secondary | ICD-10-CM

## 2016-07-28 DIAGNOSIS — L02419 Cutaneous abscess of limb, unspecified: Secondary | ICD-10-CM

## 2016-07-28 DIAGNOSIS — L03119 Cellulitis of unspecified part of limb: Secondary | ICD-10-CM

## 2016-07-28 LAB — GLUCOSE, CAPILLARY
Glucose-Capillary: 144 mg/dL — ABNORMAL HIGH (ref 65–99)
Glucose-Capillary: 140 mg/dL — ABNORMAL HIGH (ref 65–99)
Glucose-Capillary: 120 mg/dL — ABNORMAL HIGH (ref 65–99)
Glucose-Capillary: 124 mg/dL — ABNORMAL HIGH (ref 65–99)

## 2016-07-28 LAB — CBC
HCT: 30.2 % — ABNORMAL LOW (ref 39.0–52.0)
Hemoglobin: 9.5 g/dL — ABNORMAL LOW (ref 13.0–17.0)
MCH: 27.3 pg (ref 26.0–34.0)
MCHC: 31.5 g/dL (ref 30.0–36.0)
MCV: 86.8 fL (ref 78.0–100.0)
Platelets: 283 10*3/uL (ref 150–400)
RBC: 3.48 MIL/uL — ABNORMAL LOW (ref 4.22–5.81)
RDW: 14.7 % (ref 11.5–15.5)
WBC: 9.7 10*3/uL (ref 4.0–10.5)

## 2016-07-28 LAB — BASIC METABOLIC PANEL
Anion gap: 7 (ref 5–15)
BUN: 13 mg/dL (ref 6–20)
CO2: 26 mmol/L (ref 22–32)
Calcium: 8.8 mg/dL — ABNORMAL LOW (ref 8.9–10.3)
Chloride: 105 mmol/L (ref 101–111)
Creatinine, Ser: 1.78 mg/dL — ABNORMAL HIGH (ref 0.61–1.24)
GFR calc Af Amer: 47 mL/min — ABNORMAL LOW (ref >60)
GFR calc non Af Amer: 41 mL/min — ABNORMAL LOW (ref >60)
Glucose, Bld: 139 mg/dL — ABNORMAL HIGH (ref 65–99)
Potassium: 3.6 mmol/L (ref 3.5–5.1)
Sodium: 138 mmol/L (ref 135–145)

## 2016-07-28 LAB — HEPARIN LEVEL (UNFRACTIONATED): Heparin Unfractionated: 0.41 IU/mL (ref 0.30–0.70)

## 2016-07-28 MED ORDER — ISOSORBIDE MONONITRATE ER 60 MG PO TB24
90.0000 mg | ORAL_TABLET | Freq: Every day | ORAL | Status: DC
  Administered 2016-07-28 – 2016-08-03 (×7): 90 mg via ORAL
  Filled 2016-07-28 (×7): qty 1

## 2016-07-28 NOTE — Telephone Encounter (Signed)
Rx(s) sent to pharmacy electronically.  

## 2016-07-28 NOTE — Progress Notes (Signed)
ANTICOAGULATION CONSULT NOTE - Follow Up Consult  Pharmacy Consult for Heparin Indication: 3V CAD  Not on File  Patient Measurements: Height: 5\' 9"  (175.3 cm) Weight: (!) 322 lb 1.5 oz (146.1 kg) IBW/kg (Calculated) : 70.7  Heparin dosing weight: 105kg  Vital Signs: Temp: 98.7 F (37.1 C) (08/16 0550) Temp Source: Oral (08/16 0550) BP: 159/83 (08/16 0550) Pulse Rate: 70 (08/16 0550)  Labs:  Recent Labs  07/26/16 0349  07/27/16 0244 07/27/16 1520 07/28/16 0442 07/28/16 0444  HGB 9.5*  --  9.6*  --  9.5*  --   HCT 30.2*  --  30.3*  --  30.2*  --   PLT 243  --  254  --  283  --   HEPARINUNFRC  --   < > 0.80* 0.67  --  0.41  CREATININE 1.57*  --  1.66*  --  1.78*  --   < > = values in this interval not displayed.  Estimated Creatinine Clearance: 65.3 mL/min (by C-G formula based on SCr of 1.78 mg/dL).  Assessment: 60 yo male s/p cath which showed 3V CAD. Pt refused CABG and plans for PCI 8/16. MRI of foot consistent with osteo and abscess, now s/p transmetatarsal amputation.   Heparin level remains therapeutic on 1900 units/hr. Hg low stable, plt wnl, no bleed documented.  Goal of Therapy:  Heparin level 0.3-0.7 units/ml Monitor platelets by anticoagulation protocol: Yes   Plan:  - Continue heparin at 1900 units/hr - Daily HL/CBC - Monitor s/sx bleeding - PCI for 8/16  Elicia Lamp, PharmD, BCPS Clinical Pharmacist 07/28/2016 8:41 AM

## 2016-07-28 NOTE — Progress Notes (Signed)
Subjective:  No chest pain; feels well  Objective:   Vital Signs : Vitals:   07/27/16 0948 07/27/16 1324 07/27/16 1955 07/28/16 0550  BP: 128/72 137/82 136/77 (!) 159/83  Pulse: 85 84 83 70  Resp: _0 Temp: 98.5 F (36.9 C) 98.6 F (37 C) 98.6 F (37 C) 98.7 F (37.1 C)  TempSrc: Oral Oral Oral Oral  SpO2: 98% 96% 96% 94%  Weight:    (!) 322 lb 1.5 oz (146.1 kg)  Height:        Intake/Output from previous day:  Intake/Output Summary (Last 24 hours) at 07/28/16 0945 Last data filed at 07/28/16 1829  Gross per 24 hour  Intake          3350.43 ml  Output             3600 ml  Net          -249.57 ml    I/O since admission: -3509  Wt Readings from Last 3 Encounters:  07/28/16 (!) 322 lb 1.5 oz (146.1 kg)  02/16/16 297 lb (134.7 kg)  10/14/15 279 lb 14.4 oz (127 kg)    Medications: . allopurinol  300 mg Oral Daily  . amLODipine  10 mg Oral Daily  . antiseptic oral rinse  7 mL Mouth Rinse BID  . aspirin  81 mg Oral Daily  . ceFTAROline (TEFLARO) IV  600 mg Intravenous Q12H  . doxazosin  8 mg Oral Daily  . furosemide  40 mg Oral Daily  . hydrALAZINE  37.5 mg Oral BID  . insulin aspart  0-20 Units Subcutaneous TID WC  . insulin NPH Human  22 Units Subcutaneous QHS   And  . insulin NPH Human  20 Units Subcutaneous QAC breakfast  . isosorbide mononitrate  60 mg Oral Daily  . metoprolol succinate  75 mg Oral Daily  . pantoprazole  40 mg Oral Daily  . potassium chloride  20 mEq Oral Daily  . rosuvastatin  40 mg Oral q1800  . sodium chloride flush  3 mL Intravenous Q12H  . sodium chloride flush  3 mL Intravenous Q12H  . ticagrelor  90 mg Oral BID    . sodium chloride 10 mL (07/24/16 1118)  . sodium chloride 1 mL/kg/hr (07/28/16 0822)  . heparin 1,900 Units/hr (07/28/16 0625)  . nitroGLYCERIN 15 mcg/min (07/27/16 0215)    Physical Exam:   General appearance: alert, cooperative, no distress and morbidly obese Neck: no adenopathy, no JVD, supple,  symmetrical, trachea midline and thyroid not enlarged, symmetric, no tenderness/mass/nodules Lungs: clear to auscultation bilaterally Heart: regular rate and rhythm and 1/6 Abdomen: soft, non-tender; bowel sounds normal; no masses,  no organomegaly Extremities: R MT amputation Neurologic: Grossly normal   Rate: 85  Rhythm: NSR  ECG (independently read by me): NSR at 81; PRWP anteriorly  ECG (independently read by me): ST at 118    Lab Results:   Recent Labs  07/26/16 0349 07/27/16 0244 07/28/16 0442  NA 138 137 138  K 3.4* 3.7 3.6  CL 107 105 105  CO2 _1 GLUCOSE 197* 171* 139*  BUN _2 CREATININE 1.57* 1.66* 1.78*  CALCIUM 8.6* 8.7* 8.8*    Hepatic Function Latest Ref Rng & Units 02/16/2016 10/13/2015 07/03/2014  Total Protein 6.1 - 8.1 g/dL 6.9 7.1 6.6  Albumin 3.6 - 5.1 g/dL 3.5(L) 3.6 2.8(L)  AST 10 - 35 U/L _3 ALT 9 - 46  U/L _0 Alk Phosphatase 40 - 115 U/L 131(H) 112 120(H)  Total Bilirubin 0.2 - 1.2 mg/dL 0.4 0.3 <0.2(L)     Recent Labs  07/26/16 0349 07/27/16 0244 07/28/16 0442  WBC 9.9 10.6* 9.7  HGB 9.5* 9.6* 9.5*  HCT 30.2* 30.3* 30.2*  MCV 85.8 85.8 86.8  PLT 243 254 283    No results for input(s): TROPONINI in the last 72 hours.  Invalid input(s): CK, MB  Lab Results  Component Value Date   TSH 3.175 07/18/2016   No results for input(s): HGBA1C in the last 72 hours.  No results for input(s): PROT, ALBUMIN, AST, ALT, ALKPHOS, BILITOT, BILIDIR, IBILI in the last 72 hours. No results for input(s): INR in the last 72 hours. BNP (last 3 results) No results for input(s): BNP in the last 8760 hours.  ProBNP (last 3 results) No results for input(s): PROBNP in the last 8760 hours.   Lipid Panel     Component Value Date/Time   CHOL 205 (H) 02/16/2016 0959   TRIG 101 02/16/2016 0959   HDL 102 02/16/2016 0959   CHOLHDL 2.0 02/16/2016 0959   VLDL 20 02/16/2016 0959   LDLCALC 83 02/16/2016 0959      Imaging -------------------------------------------------------------------  07/21/15 ECHO Study Conclusions  - Left ventricle: The cavity size was normal. Wall thickness was   increased in a pattern of moderate LVH. Systolic function was   mildly reduced. The estimated ejection fraction was in the range   of 45% to 50%. There is akinesis of the mid-apicalanteroseptal   myocardium. Doppler parameters are consistent with abnormal left   ventricular relaxation (grade 1 diastolic dysfunction).  EF 50% AT CATH 07/19/2016  Assessment/Plan:   Principal Problem:   Unstable angina pectoris (HCC) Active Problems:   CAD in native artery   Chronic combined systolic and diastolic CHF (congestive heart failure) (HCC)   Coronary artery disease   Hyperlipemia   Renal insufficiency   Penetrating wound of right foot   Type 1 diabetes mellitus with right diabetic foot ulcer (HCC)   Chronic osteomyelitis of toe of right foot (HCC)   CKD (chronic kidney disease) stage 3, GFR 30-59 ml/min   Chronic osteomyelitis of right foot (HCC)   Cellulitis and abscess of leg, except foot  1. CAD with 3 vessel disease;  Plan for PCI to LAD and LCx, deferred from last week due to fever and osteomyelitis. Per ID since pt is s/p amputation and on antibiotics may not need to wait for antibiotic completion. Tolerating Toprol at 75 mg, will not increase today but can further titrate as needed.  No angina on heparin, NTG,Cr today is increased at 1.78.  Therefore, will defer PCI today, hydrate ,re-check Cr in am and if improved plan for tomorrow.  2. Chronic combined systolic/diastolic Heart failure; currently compensated.   3. Osteomyelitis abscess of MTP joint and great toe/ s/p R TM amputation; plan for 3 weeks of Teflaro per ID  4.  CKD  Cr increased to 1.78 today.  With EF at 50% at cath and weight 322 lbs; will start iv NS at 100 cc/hr today and continue until PCI possibly tomorrow.   5.  DM    [name], MD, [facility] 07/28/2016, 9:45 AM

## 2016-07-28 NOTE — Progress Notes (Signed)
Interventional Cardiology   I have interviewed and examined Joshua [name] and note interval amputation of a portion of the right foot.  Asymptomatic from CV standpoint.  Scheduled for double vessel PCI today.  Creat 1.78 at ~ 5 AM. Was 1.53 on 8/12 (1.34 on admission on 07/18/16)  Plan hydrate today and perform PCI tomorrow if possible.  Check AM BMET.

## 2016-07-28 NOTE — Progress Notes (Signed)
[facility] consult progress note                                                                               Patient Demographics  Joshua [name], is a 60 y.o. male, DOB - [birth year], QBH:[id number]  Admit date - 07/18/2016   Admitting Physician [name], MD  Outpatient Primary MD for the patient is Joshua Austria, MD  Outpatient specialists:   LOS - 9  days    Chief Complaint  Patient presents with  . Chest Pain       Brief summary   60year old male with CAD s/p LAD stent admitted with unstable angina by cardiology service, plan for PCI held off due to right foot infection for which he has now had TM amputation on 8/12 with Dr. Sharol Given. ID and [facility] were consulted as well. ID plans empiric Teflaro for 3 weeks, and cardiology will decide this coming week on PCI, likely stenting as patient has declined CABG   Assessment & Plan   Unstable angina pectoris (HCC) and CAD in native artery - stable, management per primary team, cardiology, planning PCI this week    Chronic combined systolic and diastolic CHF (congestive heart failure) ([place]), CAD - Management per cardiology, primary team    acute on chronic kidney disease stage III  - Baseline creatinine around 1.5, on Lasix and IV antibiotics  - Agree with IV fluid hydration, recommend to hold Lasix  Right foot wound -  - s/p TM amputation with Dr. Sharol Given on 8/12, wound vac in place. ID recommended 3 weeks Teflaro with weeks labs and ID follow up prior to completion of antibiotics.  - Patient will need a PICC line placed if he needs to be discharged with IV antibiotics    Type 1 diabetes mellitus with right diabetic foot ulcer ([place]) - CBGs stable, continue sliding scale insulin    Chronic osteomyelitis of toe of right foot ([place]) - s/p TM amputation - Antibiotics per ID  Code Status: full  DVT Prophylaxis:   Family Communication: Discussed in detail with the patient, all imaging results, lab  results explained to the patient   Disposition Plan: Per cardiology  Time Spent in minutes   25 minutes  Procedures:  Cardiac cath 8/7  TM amputation right foot 8/12   Antimicrobials:  Dapto 8/12-8/13  Rocephin 8/12 - 8/13  Teflaro 8/13 for planned 3 weeks  Medications  Scheduled Meds: . allopurinol  300 mg Oral Daily  . amLODipine  10 mg Oral Daily  . antiseptic oral rinse  7 mL Mouth Rinse BID  . aspirin  81 mg Oral Daily  . ceFTAROline (TEFLARO) IV  600 mg Intravenous Q12H  . doxazosin  8 mg Oral Daily  . furosemide  40 mg Oral Daily  . hydrALAZINE  37.5 mg Oral BID  . insulin aspart  0-20 Units Subcutaneous TID WC  . insulin NPH Human  22 Units Subcutaneous QHS   And  . insulin NPH Human  20 Units Subcutaneous QAC breakfast  . isosorbide mononitrate  90 mg Oral Daily  . metoprolol succinate  75 mg Oral Daily  .  pantoprazole  40 mg Oral Daily  . potassium chloride  20 mEq Oral Daily  . rosuvastatin  40 mg Oral q1800  . sodium chloride flush  3 mL Intravenous Q12H  . sodium chloride flush  3 mL Intravenous Q12H  . ticagrelor  90 mg Oral BID   Continuous Infusions: . sodium chloride 10 mL (07/24/16 1118)  . sodium chloride 1 mL/kg/hr (07/28/16 1146)  . heparin 1,900 Units/hr (07/28/16 0625)   PRN Meds:.sodium chloride, sodium chloride, acetaminophen **OR** acetaminophen, acetaminophen, HYDROmorphone (DILAUDID) injection, methocarbamol **OR** methocarbamol (ROBAXIN)  IV, metoCLOPramide **OR** metoCLOPramide (REGLAN) injection, nitroGLYCERIN, ondansetron (ZOFRAN) IV, ondansetron **OR** ondansetron (ZOFRAN) IV, sodium chloride flush, sodium chloride flush   Antibiotics   Anti-infectives    Start     Dose/Rate Route Frequency Ordered Stop   07/25/16 1000  ceftaroline (TEFLARO) 600 mg in sodium chloride 0.9 % 250 mL IVPB     600 mg 250 mL/hr over 60 Minutes Intravenous Every 12 hours 07/25/16 0913     07/24/16 1400  DAPTOmycin (CUBICIN) 1,000 mg in sodium  chloride 0.9 % IVPB  Status:  Discontinued     1,000 mg 240 mL/hr over 30 Minutes Intravenous Every 24 hours 07/24/16 1232 07/25/16 0913   07/24/16 1230  cefTRIAXone (ROCEPHIN) 2 g in dextrose 5 % 50 mL IVPB  Status:  Discontinued     2 g 100 mL/hr over 30 Minutes Intravenous Every 24 hours 07/24/16 1219 07/25/16 0913   07/24/16 0900  ceFAZolin (ANCEF) 3 g in dextrose 5 % 50 mL IVPB  Status:  Discontinued    Comments:  CAN YOU PLEASE NOT GIVE THIS PRE-OP BUT PERIOPERATIVELY SO WE CAN GET SOME INTRA-OPERATIVE CULTURES? KEES VAN DAM   3 g 130 mL/hr over 30 Minutes Intravenous To [facility] 07/23/16 1918 07/24/16 0826   07/24/16 0849  ceFAZolin (ANCEF) 1 GM/50ML IVPB    Comments:  Marinda Elk   : cabinet override      07/24/16 0849 07/24/16 2059   07/24/16 0849  ceFAZolin (ANCEF) 2-4 GM/100ML-% IVPB    Comments:  Marinda Elk   : cabinet override      07/24/16 0849 07/24/16 2059   07/24/16 0600  ceFAZolin (ANCEF) 3 g in dextrose 5 % 50 mL IVPB  Status:  Discontinued     3 g 130 mL/hr over 30 Minutes Intravenous On call to O.R. 07/23/16 1749 07/23/16 1918        Subjective:   Travelle Mcclimans was seen and examined today.  No complaints per patient, awaiting PCI, has wound VAC.pain controlled. Patient denies dizziness, chest pain, shortness of breath, abdominal pain, N/V/D/C, new weakness, numbess, tingling. No acute events overnight.    Objective:   Vitals:   07/27/16 1324 07/27/16 1955 07/28/16 0550 07/28/16 1009  BP: 137/82 136/77 (!) 159/83 (!) 154/68  Pulse: 84 83 70 79  Resp: $Remo'18 20 18 18  'wmtQg$ Temp: 98.6 F (37 C) 98.6 F (37 C) 98.7 F (37.1 C) 98.5 F (36.9 C)  TempSrc: Oral Oral Oral Oral  SpO2: 96% 96% 94% 98%  Weight:   (!) 146.1 kg (322 lb 1.5 oz)   Height:        Intake/Output Summary (Last 24 hours) at 07/28/16 1221 Last data filed at 07/28/16 0625  Gross per 24 hour  Intake          3350.43 ml  Output             2900 ml  Net  450.43 ml      Wt Readings from Last 3 Encounters:  07/28/16 (!) 146.1 kg (322 lb 1.5 oz)  02/16/16 134.7 kg (297 lb)  10/14/15 127 kg (279 lb 14.4 oz)     Exam  General: Alert and oriented x 3, NAD  HEENT:  PERRLA, EOMI, Anicteric Sclera, mucous membranes moist.   Neck: Supple, no JVD, no masses  Cardiovascular: S1 S2 auscultated, no rubs, murmurs or gallops. Regular rate and rhythm.  Respiratory: Clear to auscultation bilaterally, no wheezing, rales or rhonchi  Gastrointestinal: Soft, nontender, nondistended, + bowel sounds  Ext: no cyanosis clubbing or edema, right forefoot with wound VAC,  neuro: AAOx3, Cr N's II- XII. Strength 5/5 upper and lower extremities bilaterally  Skin: No rashes  Psych: Normal affect and demeanor, alert and oriented x3    Data Reviewed:  I have personally reviewed following labs and imaging studies  Micro Results Recent Results (from the past 240 hour(s))  Culture, blood (routine x 2)     Status: None   Collection Time: 07/21/16 10:40 PM  Result Value Ref Range Status   Specimen Description BLOOD LEFT ARM  Final   Special Requests BOTTLES DRAWN AEROBIC AND ANAEROBIC 10ML  Final   Culture NO GROWTH 5 DAYS  Final   Report Status 07/26/2016 FINAL  Final  Culture, blood (routine x 2)     Status: None   Collection Time: 07/21/16 10:48 PM  Result Value Ref Range Status   Specimen Description BLOOD LEFT HAND  Final   Special Requests BOTTLES DRAWN AEROBIC AND ANAEROBIC 10ML  Final   Culture NO GROWTH 5 DAYS  Final   Report Status 07/26/2016 FINAL  Final  Culture, Urine     Status: None   Collection Time: 07/22/16 12:33 AM  Result Value Ref Range Status   Specimen Description URINE, CLEAN CATCH  Final   Special Requests NONE  Final   Culture NO GROWTH  Final   Report Status 07/23/2016 FINAL  Final  Surgical PCR screen     Status: None   Collection Time: 07/23/16  8:11 PM  Result Value Ref Range Status   MRSA, PCR NEGATIVE NEGATIVE Final    Staphylococcus aureus NEGATIVE NEGATIVE Final    Comment:        The Xpert SA Assay (FDA approved for NASAL specimens in patients over 65 years of age), is one component of a comprehensive surveillance program.  Test performance has been validated by [facility] for patients greater than or equal to 74 year old. It is not intended to diagnose infection nor to guide or monitor treatment.     Radiology Reports Dg Chest 2 View  Result Date: 07/18/2016 CLINICAL DATA:  Left-sided chest pain for 2 weeks, initial encounter EXAM: CHEST  2 VIEW COMPARISON:  06/23/2016 FINDINGS: Cardiac shadow is at the upper limits of normal in size. Multiple calcified granulomas are noted consistent with prior granulomatous disease. No focal confluent infiltrate is seen. Mild bibasilar scarring is again noted. Degenerative change of the thoracic spine is seen. IMPRESSION: No acute abnormality noted. Electronically Signed   By: Inez Catalina M.D.   On: 07/18/2016 16:59   Ct Angio Chest Pe W And/or Wo Contrast  Result Date: 07/18/2016 CLINICAL DATA:  Chest discomfort for several days, worse last night and today. Burning and tightness. EXAM: CT ANGIOGRAPHY CHEST WITH CONTRAST TECHNIQUE: Multidetector CT imaging of the chest was performed using the standard protocol during bolus administration of intravenous contrast. Multiplanar  CT image reconstructions and MIPs were obtained to evaluate the vascular anatomy. CONTRAST:  100 cc Isovue 370. COMPARISON:  Plain films of the chest today and 10/28/2013. FINDINGS: Evaluation for pulmonary embolus is limited by bolus timing. The study was not repeated as the patient has renal insufficiency and mildly decreased GFR. No central or proximal segmental pulmonary embolus is identified. There is cardiomegaly. Extensive calcific coronary artery disease is seen. Multiple calcified mediastinal and hilar lymph nodes are noted. No axillary lymphadenopathy. No consolidative process is  identified. There is some reticulation of lung parenchyma best seen in the right middle lobe and left lung base. No nodule is identified. Visualized upper abdomen demonstrates no focal abnormality. No lytic or sclerotic bony lesions identified. Review of the MIP images confirms the above findings. IMPRESSION: Although the study is somewhat limited by bolus timing. No pulmonary embolus is seen. Calcified mediastinal and hilar lymph nodes could be due to old granulomatous disease or sarcoid. There is mild reticulation of pulmonary parenchyma best seen in the right middle lobe which may be due to early pulmonary changes of sarcoidosis. Cardiomegaly. Calcific aortic and coronary atherosclerosis. Electronically Signed   By: Inge Rise M.D.   On: 07/18/2016 20:02   Mr Foot Right Wo Contrast  Result Date: 07/23/2016 CLINICAL DATA:  Patient reports of bleeding wound of the right great toe and fluctuance on the bottom of the right foot. Question abscess or osteomyelitis. EXAM: MRI OF THE RIGHT FOREFOOT WITHOUT CONTRAST TECHNIQUE: Multiplanar, multisequence MR imaging was performed. No intravenous contrast was administered. COMPARISON:  MRI right foot 04/11/2016. Plain films the right foot 01/23/2016 and 07/16/2016. FINDINGS: There is edema and corresponding decreased T1 signal throughout the distal phalanx of the great toe consistent with osteomyelitis. Small focus of marrow edema is seen in the head of the proximal phalanx of the great toe. The patient is status post amputation of the second toe, third toe and distal third metatarsal as seen on prior exams. Moderate first MTP osteoarthritis is identified. Mixed signal intensity fluid most consistent with abscess is seen in soft tissues of the pad of the great toe deep to the distal phalanx. The collection measures approximately 0.8 cm long by 1 cm craniocaudal by 0.4 cm transverse. Diffuse subcutaneous edema is present about the foot. There appears to be bandaging  at the head of the proximal phalanx of the great toe on the medial side. Intrinsic musculature of the foot is atrophied. IMPRESSION: Findings consistent with osteomyelitis throughout the distal phalanx of the great toe with an abscess in the plantar soft tissues at the level of the distal phalanx. Mild marrow edema in the head of the proximal phalanx of the great toe may be reactive or could reflect small focus of osteomyelitis. Soft tissue edema throughout the dorsum of the foot consistent with cellulitis or dependent change. First MTP osteoarthritis. Status post amputation of the second and third toes and distal third metatarsal. Electronically Signed   By: Inge Rise M.D.   On: 07/23/2016 13:07   Dg Foot Complete Right  Result Date: 07/16/2016 CLINICAL DATA:  Osteomyelitis great toe EXAM: RIGHT FOOT COMPLETE - 3+ VIEW COMPARISON:  01/23/2016 FINDINGS: Soft tissue swelling and ulcer of the great toe. Negative for osteomyelitis. No fracture. Amputation of the second and third toes without osteomyelitis. Fourth and fifth toes appear intact Degenerative change in the midfoot.  Calcaneal spurring. IMPRESSION: Negative for osteomyelitis. Electronically Signed   By: Franchot Gallo M.D.   On: 07/16/2016 11:49  Lab Data:  CBC:  Recent Labs Lab 07/24/16 0359 07/25/16 0253 07/26/16 0349 07/27/16 0244 07/28/16 0442  WBC 7.7 9.4 9.9 10.6* 9.7  HGB 9.8* 9.8* 9.5* 9.6* 9.5*  HCT 31.0* 31.6* 30.2* 30.3* 30.2*  MCV 86.1 88.3 85.8 85.8 86.8  PLT 189 218 243 254 242   Basic Metabolic Panel:  Recent Labs Lab 07/24/16 1311 07/25/16 0253 07/26/16 0349 07/27/16 0244 07/28/16 0442  NA 135 139 138 137 138  K 3.8 3.7 3.4* 3.7 3.6  CL 106 105 107 105 105  CO2 21* $Remov'22 22 23 26  'oTXwUr$ GLUCOSE 187* 160* 197* 171* 139*  BUN $Re'18 16 12 12 13  'mjA$ CREATININE 1.53* 1.55* 1.57* 1.66* 1.78*  CALCIUM 8.4* 8.7* 8.6* 8.7* 8.8*   GFR: Estimated Creatinine Clearance: 65.3 mL/min (by C-G formula based on SCr of 1.78  mg/dL). Liver Function Tests: No results for input(s): AST, ALT, ALKPHOS, BILITOT, PROT, ALBUMIN in the last 168 hours. No results for input(s): LIPASE, AMYLASE in the last 168 hours. No results for input(s): AMMONIA in the last 168 hours. Coagulation Profile: No results for input(s): INR, PROTIME in the last 168 hours. Cardiac Enzymes:  Recent Labs Lab 07/24/16 1311  CKTOTAL 338   BNP (last 3 results) No results for input(s): PROBNP in the last 8760 hours. HbA1C: No results for input(s): HGBA1C in the last 72 hours. CBG:  Recent Labs Lab 07/27/16 1106 07/27/16 1609 07/27/16 2105 07/28/16 0541 07/28/16 1129  GLUCAP 157* 151* 186* 144* 140*   Lipid Profile: No results for input(s): CHOL, HDL, LDLCALC, TRIG, CHOLHDL, LDLDIRECT in the last 72 hours. Thyroid Function Tests: No results for input(s): TSH, T4TOTAL, FREET4, T3FREE, THYROIDAB in the last 72 hours. Anemia Panel: No results for input(s): VITAMINB12, FOLATE, FERRITIN, TIBC, IRON, RETICCTPCT in the last 72 hours. Urine analysis:    Component Value Date/Time   COLORURINE YELLOW 07/22/2016 0033   APPEARANCEUR CLOUDY (A) 07/22/2016 0033   LABSPEC 1.021 07/22/2016 0033   PHURINE 5.0 07/22/2016 0033   GLUCOSEU NEGATIVE 07/22/2016 0033   HGBUR SMALL (A) 07/22/2016 0033   BILIRUBINUR NEGATIVE 07/22/2016 0033   KETONESUR NEGATIVE 07/22/2016 0033   PROTEINUR >300 (A) 07/22/2016 0033   UROBILINOGEN 0.2 11/01/2011 1410   NITRITE NEGATIVE 07/22/2016 0033   LEUKOCYTESUR NEGATIVE 07/22/2016 0033     Kandiss Ihrig M.D. [facility] 07/28/2016, 12:21 PM  Pager: [phone] Between 7am to 7pm - call Pager - [phone]  After 7pm go to [email or web address] - password [email or web address]  Call night coverage person covering after 7pm

## 2016-07-29 ENCOUNTER — Encounter (HOSPITAL_COMMUNITY)
Admission: EM | Disposition: A | Discharge status: Rehabilitation Facility | Payer: Self-pay | Source: Home / Self Care | Attending: Cardiovascular Disease

## 2016-07-29 ENCOUNTER — Other Ambulatory Visit: Payer: Self-pay

## 2016-07-29 HISTORY — PX: CARDIAC CATHETERIZATION: SHX172

## 2016-07-29 LAB — CBC
HCT: 29.6 % — ABNORMAL LOW (ref 39.0–52.0)
Hemoglobin: 9.2 g/dL — ABNORMAL LOW (ref 13.0–17.0)
MCH: 26.9 pg (ref 26.0–34.0)
MCHC: 31.1 g/dL (ref 30.0–36.0)
MCV: 86.5 fL (ref 78.0–100.0)
Platelets: 317 10*3/uL (ref 150–400)
RBC: 3.42 MIL/uL — ABNORMAL LOW (ref 4.22–5.81)
RDW: 14.5 % (ref 11.5–15.5)
WBC: 10.9 10*3/uL — ABNORMAL HIGH (ref 4.0–10.5)

## 2016-07-29 LAB — HEPARIN LEVEL (UNFRACTIONATED): Heparin Unfractionated: 0.38 IU/mL (ref 0.30–0.70)

## 2016-07-29 LAB — BASIC METABOLIC PANEL
Anion gap: 10 (ref 5–15)
BUN: 8 mg/dL (ref 6–20)
CO2: 26 mmol/L (ref 22–32)
Calcium: 8.7 mg/dL — ABNORMAL LOW (ref 8.9–10.3)
Chloride: 104 mmol/L (ref 101–111)
Creatinine, Ser: 1.61 mg/dL — ABNORMAL HIGH (ref 0.61–1.24)
GFR calc Af Amer: 53 mL/min — ABNORMAL LOW (ref >60)
GFR calc non Af Amer: 46 mL/min — ABNORMAL LOW (ref >60)
Glucose, Bld: 109 mg/dL — ABNORMAL HIGH (ref 65–99)
Potassium: 3.6 mmol/L (ref 3.5–5.1)
Sodium: 140 mmol/L (ref 135–145)

## 2016-07-29 LAB — GLUCOSE, CAPILLARY
Glucose-Capillary: 113 mg/dL — ABNORMAL HIGH (ref 65–99)
Glucose-Capillary: 128 mg/dL — ABNORMAL HIGH (ref 65–99)
Glucose-Capillary: 110 mg/dL — ABNORMAL HIGH (ref 65–99)
Glucose-Capillary: 120 mg/dL — ABNORMAL HIGH (ref 65–99)

## 2016-07-29 LAB — POCT ACTIVATED CLOTTING TIME: Activated Clotting Time: 406 seconds

## 2016-07-29 SURGERY — CORONARY STENT INTERVENTION

## 2016-07-29 MED ORDER — MIDAZOLAM HCL 2 MG/2ML IJ SOLN
INTRAMUSCULAR | Status: DC | PRN
  Administered 2016-07-29: 1 mg via INTRAVENOUS

## 2016-07-29 MED ORDER — ONDANSETRON HCL 4 MG/2ML IJ SOLN
4.0000 mg | Freq: Four times a day (QID) | INTRAMUSCULAR | Status: DC | PRN
  Administered 2016-07-29: 4 mg via INTRAVENOUS
  Filled 2016-07-29: qty 2

## 2016-07-29 MED ORDER — HEPARIN SODIUM (PORCINE) 5000 UNIT/ML IJ SOLN
5000.0000 [IU] | Freq: Three times a day (TID) | INTRAMUSCULAR | Status: DC
  Administered 2016-07-29 – 2016-08-02 (×13): 5000 [IU] via SUBCUTANEOUS
  Filled 2016-07-29 (×13): qty 1

## 2016-07-29 MED ORDER — SODIUM CHLORIDE 0.9 % IV SOLN
INTRAVENOUS | Status: DC | PRN
  Administered 2016-07-29 (×2): 1.75 mg/kg/h via INTRAVENOUS

## 2016-07-29 MED ORDER — TICAGRELOR 90 MG PO TABS: 90.0000 mg | ORAL_TABLET | Freq: Two times a day (BID) | ORAL | Status: AC

## 2016-07-29 MED ORDER — ACETAMINOPHEN 325 MG PO TABS
650.0000 mg | ORAL_TABLET | ORAL | Status: DC | PRN
  Administered 2016-07-30: 650 mg via ORAL
  Filled 2016-07-29: qty 2

## 2016-07-29 MED ORDER — HEPARIN (PORCINE) IN NACL 2-0.9 UNIT/ML-% IJ SOLN
INTRAMUSCULAR | Status: AC
  Filled 2016-07-29: qty 500

## 2016-07-29 MED ORDER — SODIUM CHLORIDE 0.9 % WEIGHT BASED INFUSION: 1.0000 mL/kg/h | INTRAVENOUS | Status: DC

## 2016-07-29 MED ORDER — TICAGRELOR 90 MG PO TABS
90.0000 mg | ORAL_TABLET | Freq: Two times a day (BID) | ORAL | Status: DC
  Administered 2016-07-29 – 2016-08-03 (×10): 90 mg via ORAL
  Filled 2016-07-29 (×9): qty 1

## 2016-07-29 MED ORDER — BIVALIRUDIN BOLUS VIA INFUSION - CUPID
INTRAVENOUS | Status: DC | PRN
  Administered 2016-07-29: 106.575 mg via INTRAVENOUS

## 2016-07-29 MED ORDER — IOPAMIDOL (ISOVUE-370) INJECTION 76%
INTRAVENOUS | Status: DC | PRN
  Administered 2016-07-29: 90 mL via INTRAVENOUS

## 2016-07-29 MED ORDER — FENTANYL CITRATE (PF) 100 MCG/2ML IJ SOLN
INTRAMUSCULAR | Status: AC
  Filled 2016-07-29: qty 2

## 2016-07-29 MED ORDER — SODIUM CHLORIDE 0.9 % IV SOLN: 250.0000 mL | INTRAVENOUS | Status: DC | PRN

## 2016-07-29 MED ORDER — LIDOCAINE HCL (PF) 1 % IJ SOLN
INTRAMUSCULAR | Status: DC | PRN
  Administered 2016-07-29: 2 mL

## 2016-07-29 MED ORDER — HEPARIN (PORCINE) IN NACL 2-0.9 UNIT/ML-% IJ SOLN
INTRAMUSCULAR | Status: DC | PRN
  Administered 2016-07-29: 1500 mL

## 2016-07-29 MED ORDER — ASPIRIN 81 MG PO CHEW
81.0000 mg | CHEWABLE_TABLET | Freq: Every day | ORAL | Status: DC
  Administered 2016-07-30 – 2016-08-03 (×5): 81 mg via ORAL
  Filled 2016-07-29 (×5): qty 1

## 2016-07-29 MED ORDER — ASPIRIN 81 MG PO CHEW
81.0000 mg | CHEWABLE_TABLET | ORAL | Status: AC
  Administered 2016-07-29: 81 mg via ORAL

## 2016-07-29 MED ORDER — IOPAMIDOL (ISOVUE-370) INJECTION 76%
INTRAVENOUS | Status: AC
  Filled 2016-07-29: qty 125

## 2016-07-29 MED ORDER — FENTANYL CITRATE (PF) 100 MCG/2ML IJ SOLN
INTRAMUSCULAR | Status: DC | PRN
  Administered 2016-07-29 (×2): 50 ug via INTRAVENOUS

## 2016-07-29 MED ORDER — NITROGLYCERIN 1 MG/10 ML FOR IR/CATH LAB
INTRA_ARTERIAL | Status: DC | PRN
  Administered 2016-07-29 (×2): 200 ug

## 2016-07-29 MED ORDER — SODIUM CHLORIDE 0.9% FLUSH: 3.0000 mL | INTRAVENOUS | Status: DC | PRN

## 2016-07-29 MED ORDER — SODIUM CHLORIDE 0.9% FLUSH
3.0000 mL | Freq: Two times a day (BID) | INTRAVENOUS | Status: DC
  Administered 2016-07-29 – 2016-08-02 (×5): 3 mL via INTRAVENOUS

## 2016-07-29 MED ORDER — SODIUM CHLORIDE 0.9 % IV SOLN
INTRAVENOUS | Status: AC
  Administered 2016-07-29: 18:00:00 via INTRAVENOUS
  Administered 2016-07-30: 12:00:00 600 mL via INTRAVENOUS

## 2016-07-29 MED ORDER — OXYCODONE-ACETAMINOPHEN 5-325 MG PO TABS
1.0000 | ORAL_TABLET | ORAL | Status: DC | PRN
  Administered 2016-07-29 – 2016-08-02 (×4): 2 via ORAL
  Filled 2016-07-29 (×4): qty 2

## 2016-07-29 MED ORDER — VERAPAMIL HCL 2.5 MG/ML IV SOLN
INTRAVENOUS | Status: AC
  Filled 2016-07-29: qty 2

## 2016-07-29 MED ORDER — BIVALIRUDIN 250 MG IV SOLR
INTRAVENOUS | Status: AC
  Filled 2016-07-29: qty 250

## 2016-07-29 MED ORDER — VERAPAMIL HCL 2.5 MG/ML IV SOLN
INTRAVENOUS | Status: DC | PRN
  Administered 2016-07-29: 10 mL via INTRA_ARTERIAL

## 2016-07-29 MED ORDER — LIDOCAINE HCL (PF) 1 % IJ SOLN
INTRAMUSCULAR | Status: AC
  Filled 2016-07-29: qty 30

## 2016-07-29 MED ORDER — SODIUM CHLORIDE 0.9 % WEIGHT BASED INFUSION: 3.0000 mL/kg/h | INTRAVENOUS | Status: DC

## 2016-07-29 MED ORDER — HEPARIN (PORCINE) IN NACL 2-0.9 UNIT/ML-% IJ SOLN
INTRAMUSCULAR | Status: AC
  Filled 2016-07-29: qty 1000

## 2016-07-29 MED ORDER — MIDAZOLAM HCL 2 MG/2ML IJ SOLN
INTRAMUSCULAR | Status: AC
  Filled 2016-07-29: qty 2

## 2016-07-29 MED ORDER — ANGIOPLASTY BOOK
Freq: Once | Status: AC
  Administered 2016-07-29: 23:00:00
  Filled 2016-07-29: qty 1

## 2016-07-29 MED ORDER — SODIUM CHLORIDE 0.9% FLUSH: 3.0000 mL | Freq: Two times a day (BID) | INTRAVENOUS | Status: DC

## 2016-07-29 SURGICAL SUPPLY — 21 items
BALLN EMERGE MR 2.5X12 (BALLOONS) ×2
BALLN EMERGE MR 3.0X15 (BALLOONS) ×2
BALLN ~~LOC~~ EUPHORA RX 3.75X15 (BALLOONS) ×2
BALLOON EMERGE MR 2.5X12 (BALLOONS) IMPLANT
BALLOON EMERGE MR 3.0X15 (BALLOONS) IMPLANT
BALLOON ~~LOC~~ EUPHORA RX 3.75X15 (BALLOONS) IMPLANT
CATH OPTICROSS 40MHZ (CATHETERS) ×1 IMPLANT
CATH VISTA GUIDE 6FR XBLAD3.5 (CATHETERS) ×1 IMPLANT
DEVICE RAD COMP TR BAND LRG (VASCULAR PRODUCTS) ×1 IMPLANT
GLIDESHEATH SLEND A-KIT 6F 22G (SHEATH) ×1 IMPLANT
HOVERMATT SINGLE USE (MISCELLANEOUS) ×1 IMPLANT
KIT ENCORE 26 ADVANTAGE (KITS) ×2 IMPLANT
KIT HEART LEFT (KITS) ×2 IMPLANT
PACK CARDIAC CATHETERIZATION (CUSTOM PROCEDURE TRAY) ×2 IMPLANT
SLED PULL BACK IVUS (MISCELLANEOUS) ×1 IMPLANT
STENT SYNERGY DES 2.75X16 (Permanent Stent) ×1 IMPLANT
STENT SYNERGY DES 3.5X20 (Permanent Stent) ×1 IMPLANT
TRANSDUCER W/STOPCOCK (MISCELLANEOUS) ×2 IMPLANT
TUBING CIL FLEX 10 FLL-RA (TUBING) ×2 IMPLANT
WIRE ASAHI PROWATER 180CM (WIRE) ×1 IMPLANT
WIRE SAFE-T 1.5MM-J .035X260CM (WIRE) ×1 IMPLANT

## 2016-07-29 NOTE — Progress Notes (Signed)
[facility] consult progress note                                                                               Patient Demographics  Joshua [name], is a 60 y.o. male, DOB - 1955-12-21, XBD:[id number]  Admit date - 07/18/2016   Admitting Physician [name], MD  Outpatient Primary MD for the patient is [name], MD  Outpatient specialists:   LOS - 10  days    Chief Complaint  Patient presents with  . Chest Pain       Brief summary   60year old male with CAD s/p LAD stent admitted with unstable angina by cardiology service, plan for PCI held off due to right foot infection for which he has now had TM amputation on 8/12 with Dr. Sharol Given. ID and [facility] were consulted as well. ID plans empiric Teflaro for 3 weeks, and cardiology will decide this coming week on PCI, likely stenting as patient has declined CABG   Assessment & Plan   Unstable angina pectoris (HCC) and CAD in native artery - stable, management per primary team, cardiology, planning PCI today    Chronic combined systolic and diastolic CHF (congestive heart failure) ([place]), CAD - Management per cardiology, primary team    acute on chronic kidney disease stage III  - Baseline creatinine around 1.5, on Lasix and IV antibiotics  -Creatinine improving  Right foot wound -  - s/p TM amputation with Dr. Sharol Given on 8/12, wound vac in place. ID recommended 3 weeks Teflaro with weeks labs and ID follow up prior to completion of antibiotics.  - Patient will need a PICC line placed if he needs to be discharged with IV antibiotics    Type 1 diabetes mellitus with right diabetic foot ulcer ([place]) - CBGs stable, continue sliding scale insulin    Chronic osteomyelitis of toe of right foot ([place]) - s/p TM amputation - Antibiotics per ID  Code Status: full  DVT Prophylaxis:   Family Communication: Discussed in detail with the patient, all imaging results, lab results explained to the patient    Disposition Plan: Per cardiology  Time Spent in minutes   15 minutes  Procedures:  Cardiac cath 8/7  TM amputation right foot 8/12   Antimicrobials:  Dapto 8/12-8/13  Rocephin 8/12 - 8/13  Teflaro 8/13 for planned 3 weeks  Medications  Scheduled Meds: . allopurinol  300 mg Oral Daily  . amLODipine  10 mg Oral Daily  . antiseptic oral rinse  7 mL Mouth Rinse BID  . aspirin  81 mg Oral Daily  . ceFTAROline (TEFLARO) IV  600 mg Intravenous Q12H  . doxazosin  8 mg Oral Daily  . hydrALAZINE  37.5 mg Oral BID  . insulin aspart  0-20 Units Subcutaneous TID WC  . insulin NPH Human  22 Units Subcutaneous QHS   And  . insulin NPH Human  20 Units Subcutaneous QAC breakfast  . isosorbide mononitrate  90 mg Oral Daily  . metoprolol succinate  75 mg Oral Daily  . pantoprazole  40 mg Oral Daily  . potassium chloride  20 mEq Oral Daily  .  rosuvastatin  40 mg Oral q1800  . sodium chloride flush  3 mL Intravenous Q12H  . sodium chloride flush  3 mL Intravenous Q12H  . sodium chloride flush  3 mL Intravenous Q12H  . ticagrelor  90 mg Oral BID   Continuous Infusions: . sodium chloride 10 mL (07/24/16 1118)  . sodium chloride 1 mL/kg/hr (07/28/16 1739)  . [START ON 07/30/2016] sodium chloride     Followed by  . [START ON 07/30/2016] sodium chloride    . heparin 1,900 Units/hr (07/28/16 0625)   PRN Meds:.sodium chloride, sodium chloride, sodium chloride, acetaminophen **OR** acetaminophen, acetaminophen, HYDROmorphone (DILAUDID) injection, methocarbamol **OR** methocarbamol (ROBAXIN)  IV, metoCLOPramide **OR** metoCLOPramide (REGLAN) injection, nitroGLYCERIN, ondansetron (ZOFRAN) IV, ondansetron **OR** ondansetron (ZOFRAN) IV, sodium chloride flush, sodium chloride flush, sodium chloride flush   Antibiotics   Anti-infectives    Start     Dose/Rate Route Frequency Ordered Stop   07/25/16 1000  ceftaroline (TEFLARO) 600 mg in sodium chloride 0.9 % 250 mL IVPB     600 mg 250  mL/hr over 60 Minutes Intravenous Every 12 hours 07/25/16 0913     07/24/16 1400  DAPTOmycin (CUBICIN) 1,000 mg in sodium chloride 0.9 % IVPB  Status:  Discontinued     1,000 mg 240 mL/hr over 30 Minutes Intravenous Every 24 hours 07/24/16 1232 07/25/16 0913   07/24/16 1230  cefTRIAXone (ROCEPHIN) 2 g in dextrose 5 % 50 mL IVPB  Status:  Discontinued     2 g 100 mL/hr over 30 Minutes Intravenous Every 24 hours 07/24/16 1219 07/25/16 0913   07/24/16 0900  ceFAZolin (ANCEF) 3 g in dextrose 5 % 50 mL IVPB  Status:  Discontinued    Comments:  CAN YOU PLEASE NOT GIVE THIS PRE-OP BUT PERIOPERATIVELY SO WE CAN GET SOME INTRA-OPERATIVE CULTURES? KEES VAN DAM   3 g 130 mL/hr over 30 Minutes Intravenous To [facility] 07/23/16 1918 07/24/16 0826   07/24/16 0849  ceFAZolin (ANCEF) 1 GM/50ML IVPB    Comments:  Marinda Elk   : cabinet override      07/24/16 0849 07/24/16 2059   07/24/16 0849  ceFAZolin (ANCEF) 2-4 GM/100ML-% IVPB    Comments:  Marinda Elk   : cabinet override      07/24/16 0849 07/24/16 2059   07/24/16 0600  ceFAZolin (ANCEF) 3 g in dextrose 5 % 50 mL IVPB  Status:  Discontinued     3 g 130 mL/hr over 30 Minutes Intravenous On call to O.R. 07/23/16 1749 07/23/16 1918        Subjective:   Joshua [name] was seen and examined today.  No complaints, awaiting PCI, has wound VAC.pain controlled.No fevers or chills or any acute issues overnight. Patient denies dizziness, chest pain, shortness of breath, abdominal pain, N/V/D/C, new weakness, numbess, tingling.   Objective:   Vitals:   07/28/16 1009 07/28/16 1236 07/28/16 2017 07/29/16 0554  BP: (!) 154/68 137/86 (!) 141/79 130/68  Pulse: 79 73 77 76  Resp: $Remo'18 19 18 18  'fBeAa$ Temp: 98.5 F (36.9 C) 98.4 F (36.9 C) 98.7 F (37.1 C) 98.7 F (37.1 C)  TempSrc: Oral Oral Oral Oral  SpO2: 98% 97% 95% 99%  Weight:    (!) 142.1 kg (313 lb 4.4 oz)  Height:        Intake/Output Summary (Last 24 hours) at 07/29/16 1220 Last  data filed at 07/29/16 0916  Gross per 24 hour  Intake  2165.83 ml  Output             5795 ml  Net         -3629.17 ml     Wt Readings from Last 3 Encounters:  07/29/16 (!) 142.1 kg (313 lb 4.4 oz)  02/16/16 134.7 kg (297 lb)  10/14/15 127 kg (279 lb 14.4 oz)     Exam  General: Alert and oriented x 3, NAD  HEENT:    Neck:   Cardiovascular: S1 S2 auscultated, no rubs, murmurs or gallops. Regular rate and rhythm.  Respiratory: Clear to auscultation bilaterally, no wheezing, rales or rhonchi  Gastrointestinal: Soft, nontender, nondistended, + bowel sounds  Ext: no cyanosis clubbing or edema, right forefoot with wound VAC,  Neuro: no new deficits  Skin: No rashes  Psych: Normal affect and demeanor, alert and oriented x3    Data Reviewed:  I have personally reviewed following labs and imaging studies  Micro Results Recent Results (from the past 240 hour(s))  Culture, blood (routine x 2)     Status: None   Collection Time: 07/21/16 10:40 PM  Result Value Ref Range Status   Specimen Description BLOOD LEFT ARM  Final   Special Requests BOTTLES DRAWN AEROBIC AND ANAEROBIC 10ML  Final   Culture NO GROWTH 5 DAYS  Final   Report Status 07/26/2016 FINAL  Final  Culture, blood (routine x 2)     Status: None   Collection Time: 07/21/16 10:48 PM  Result Value Ref Range Status   Specimen Description BLOOD LEFT HAND  Final   Special Requests BOTTLES DRAWN AEROBIC AND ANAEROBIC 10ML  Final   Culture NO GROWTH 5 DAYS  Final   Report Status 07/26/2016 FINAL  Final  Culture, Urine     Status: None   Collection Time: 07/22/16 12:33 AM  Result Value Ref Range Status   Specimen Description URINE, CLEAN CATCH  Final   Special Requests NONE  Final   Culture NO GROWTH  Final   Report Status 07/23/2016 FINAL  Final  Surgical PCR screen     Status: None   Collection Time: 07/23/16  8:11 PM  Result Value Ref Range Status   MRSA, PCR NEGATIVE NEGATIVE Final    Staphylococcus aureus NEGATIVE NEGATIVE Final    Comment:        The Xpert SA Assay (FDA approved for NASAL specimens in patients over 31 years of age), is one component of a comprehensive surveillance program.  Test performance has been validated by [facility] for patients greater than or equal to 24 year old. It is not intended to diagnose infection nor to guide or monitor treatment.     Radiology Reports Dg Chest 2 View  Result Date: 07/18/2016 CLINICAL DATA:  Left-sided chest pain for 2 weeks, initial encounter EXAM: CHEST  2 VIEW COMPARISON:  06/23/2016 FINDINGS: Cardiac shadow is at the upper limits of normal in size. Multiple calcified granulomas are noted consistent with prior granulomatous disease. No focal confluent infiltrate is seen. Mild bibasilar scarring is again noted. Degenerative change of the thoracic spine is seen. IMPRESSION: No acute abnormality noted. Electronically Signed   By: Inez Catalina M.D.   On: 07/18/2016 16:59   Ct Angio Chest Pe W And/or Wo Contrast  Result Date: 07/18/2016 CLINICAL DATA:  Chest discomfort for several days, worse last night and today. Burning and tightness. EXAM: CT ANGIOGRAPHY CHEST WITH CONTRAST TECHNIQUE: Multidetector CT imaging of the chest was performed using the standard protocol  during bolus administration of intravenous contrast. Multiplanar CT image reconstructions and MIPs were obtained to evaluate the vascular anatomy. CONTRAST:  100 cc Isovue 370. COMPARISON:  Plain films of the chest today and 10/28/2013. FINDINGS: Evaluation for pulmonary embolus is limited by bolus timing. The study was not repeated as the patient has renal insufficiency and mildly decreased GFR. No central or proximal segmental pulmonary embolus is identified. There is cardiomegaly. Extensive calcific coronary artery disease is seen. Multiple calcified mediastinal and hilar lymph nodes are noted. No axillary lymphadenopathy. No consolidative process is  identified. There is some reticulation of lung parenchyma best seen in the right middle lobe and left lung base. No nodule is identified. Visualized upper abdomen demonstrates no focal abnormality. No lytic or sclerotic bony lesions identified. Review of the MIP images confirms the above findings. IMPRESSION: Although the study is somewhat limited by bolus timing. No pulmonary embolus is seen. Calcified mediastinal and hilar lymph nodes could be due to old granulomatous disease or sarcoid. There is mild reticulation of pulmonary parenchyma best seen in the right middle lobe which may be due to early pulmonary changes of sarcoidosis. Cardiomegaly. Calcific aortic and coronary atherosclerosis. Electronically Signed   By: Inge Rise M.D.   On: 07/18/2016 20:02   Mr Foot Right Wo Contrast  Result Date: 07/23/2016 CLINICAL DATA:  Patient reports of bleeding wound of the right great toe and fluctuance on the bottom of the right foot. Question abscess or osteomyelitis. EXAM: MRI OF THE RIGHT FOREFOOT WITHOUT CONTRAST TECHNIQUE: Multiplanar, multisequence MR imaging was performed. No intravenous contrast was administered. COMPARISON:  MRI right foot 04/11/2016. Plain films the right foot 01/23/2016 and 07/16/2016. FINDINGS: There is edema and corresponding decreased T1 signal throughout the distal phalanx of the great toe consistent with osteomyelitis. Small focus of marrow edema is seen in the head of the proximal phalanx of the great toe. The patient is status post amputation of the second toe, third toe and distal third metatarsal as seen on prior exams. Moderate first MTP osteoarthritis is identified. Mixed signal intensity fluid most consistent with abscess is seen in soft tissues of the pad of the great toe deep to the distal phalanx. The collection measures approximately 0.8 cm long by 1 cm craniocaudal by 0.4 cm transverse. Diffuse subcutaneous edema is present about the foot. There appears to be bandaging  at the head of the proximal phalanx of the great toe on the medial side. Intrinsic musculature of the foot is atrophied. IMPRESSION: Findings consistent with osteomyelitis throughout the distal phalanx of the great toe with an abscess in the plantar soft tissues at the level of the distal phalanx. Mild marrow edema in the head of the proximal phalanx of the great toe may be reactive or could reflect small focus of osteomyelitis. Soft tissue edema throughout the dorsum of the foot consistent with cellulitis or dependent change. First MTP osteoarthritis. Status post amputation of the second and third toes and distal third metatarsal. Electronically Signed   By: Inge Rise M.D.   On: 07/23/2016 13:07   Dg Foot Complete Right  Result Date: 07/16/2016 CLINICAL DATA:  Osteomyelitis great toe EXAM: RIGHT FOOT COMPLETE - 3+ VIEW COMPARISON:  01/23/2016 FINDINGS: Soft tissue swelling and ulcer of the great toe. Negative for osteomyelitis. No fracture. Amputation of the second and third toes without osteomyelitis. Fourth and fifth toes appear intact Degenerative change in the midfoot.  Calcaneal spurring. IMPRESSION: Negative for osteomyelitis. Electronically Signed   By: Juanda Crumble  Carlis Abbott M.D.   On: 07/16/2016 11:49    Lab Data:  CBC:  Recent Labs Lab 07/25/16 0253 07/26/16 0349 07/27/16 0244 07/28/16 0442 07/29/16 0316  WBC 9.4 9.9 10.6* 9.7 10.9*  HGB 9.8* 9.5* 9.6* 9.5* 9.2*  HCT 31.6* 30.2* 30.3* 30.2* 29.6*  MCV 88.3 85.8 85.8 86.8 86.5  PLT 218 243 254 283 643   Basic Metabolic Panel:  Recent Labs Lab 07/25/16 0253 07/26/16 0349 07/27/16 0244 07/28/16 0442 07/29/16 0316  NA 139 138 137 138 140  K 3.7 3.4* 3.7 3.6 3.6  CL 105 107 105 105 104  CO2 $Re'22 22 23 26 26  'VkY$ GLUCOSE 160* 197* 171* 139* 109*  BUN $Re'16 12 12 13 8  'ebc$ CREATININE 1.55* 1.57* 1.66* 1.78* 1.61*  CALCIUM 8.7* 8.6* 8.7* 8.8* 8.7*   GFR: Estimated Creatinine Clearance: 71.1 mL/min (by C-G formula based on SCr of 1.61  mg/dL). Liver Function Tests: No results for input(s): AST, ALT, ALKPHOS, BILITOT, PROT, ALBUMIN in the last 168 hours. No results for input(s): LIPASE, AMYLASE in the last 168 hours. No results for input(s): AMMONIA in the last 168 hours. Coagulation Profile: No results for input(s): INR, PROTIME in the last 168 hours. Cardiac Enzymes:  Recent Labs Lab 07/24/16 1311  CKTOTAL 338   BNP (last 3 results) No results for input(s): PROBNP in the last 8760 hours. HbA1C: No results for input(s): HGBA1C in the last 72 hours. CBG:  Recent Labs Lab 07/28/16 1129 07/28/16 1622 07/28/16 2048 07/29/16 0602 07/29/16 1128  GLUCAP 140* 120* 124* 113* 128*   Lipid Profile: No results for input(s): CHOL, HDL, LDLCALC, TRIG, CHOLHDL, LDLDIRECT in the last 72 hours. Thyroid Function Tests: No results for input(s): TSH, T4TOTAL, FREET4, T3FREE, THYROIDAB in the last 72 hours. Anemia Panel: No results for input(s): VITAMINB12, FOLATE, FERRITIN, TIBC, IRON, RETICCTPCT in the last 72 hours. Urine analysis:    Component Value Date/Time   COLORURINE YELLOW 07/22/2016 0033   APPEARANCEUR CLOUDY (A) 07/22/2016 0033   LABSPEC 1.021 07/22/2016 0033   PHURINE 5.0 07/22/2016 0033   GLUCOSEU NEGATIVE 07/22/2016 0033   HGBUR SMALL (A) 07/22/2016 0033   BILIRUBINUR NEGATIVE 07/22/2016 0033   KETONESUR NEGATIVE 07/22/2016 0033   PROTEINUR >300 (A) 07/22/2016 0033   UROBILINOGEN 0.2 11/01/2011 1410   NITRITE NEGATIVE 07/22/2016 0033   LEUKOCYTESUR NEGATIVE 07/22/2016 0033     RAI,RIPUDEEP M.D. [facility] 07/29/2016, 12:20 PM  Pager: [phone] Between 7am to 7pm - call Pager - [phone]  After 7pm go to [email or web address] - password [email or web address]  Call night coverage person covering after 7pm

## 2016-07-29 NOTE — Consult Note (Signed)
Physical Medicine and Rehabilitation Consult   Reason for Consult: Debility due to multiple medical issues. Referring Physician: Dr. Tana Coast   HPI: Joshua [name] is a 60 y.o. male with history of CAD, T2DM with gastroparesis, chronic diabetic foot ulcer and polyneuropathy, morbid obesity, pulmonary sarcoidosis who was admitted on 07/18/16 with unstable angina and ACS. He was placed on IV heparin/IV NTG and cardiac cath revealed 3 vessel disease with normal LVF.  Dr. Servando Snare recommended CABG but patient declined and elected PCI.  He was found to have abscess in plantar soft tissue at distal phalanx with osteomyelitis of right great toe. Dr. Tommy Medal consulted and recommended amputation and intra-op wound cultures prior to initiating antibiotics.  PCI deferred till infection under control and he underwent  Right great toe trans metartarsal amputation on 08/12 by Dr. Sharol Given. Prevena VAC placed and patient to be NWB RLE. He was started on IV antibiotics and 2 weeks recommended for treatment per ID.  He was hydrated for management of acute renal failure and underwent cardiac cath with PCI yesterday. PT/OT evaluations done and CIR recommended for follow up therapy.     Review of Systems  HENT: Negative for hearing loss.   Eyes: Negative for blurred vision and double vision.  Respiratory: Negative for cough and shortness of breath.   Cardiovascular: Negative for chest pain and palpitations.  Gastrointestinal: Negative for constipation, diarrhea, heartburn and nausea.  Genitourinary: Negative for dysuria and urgency.  Musculoskeletal: Positive for back pain (lower back) and joint pain. Negative for myalgias and neck pain.  Skin: Negative for itching and rash.  Neurological: Positive for weakness. Negative for dizziness, tingling, speech change, focal weakness and headaches.  Psychiatric/Behavioral: Negative for depression and memory loss. The patient does not have insomnia.       Past Medical  History:  Diagnosis Date  . Chronic combined systolic and diastolic CHF (congestive heart failure) ([place])    a. 07/2015 Echo: EF 45-50%, mod LVH, mid apical/antsept AK, Gr 1 DD.  [name] Coronary artery disease    a. 2012 s/p MI/PCI: s/p DES to mid/dist RCA and overlapping DES to LAD;  b. 07/2015 Cath: LAD 10% ISR, D1 40(jailed), LCX 62m, OM2 30, OM3 30, RCA 72m ISR, 10d ISR.  . Diabetic gastroparesis ([place])   . Essential hypertension   . GERD (gastroesophageal reflux disease)   . Gout   . Heart murmur   . Hyperlipemia   . Morbid obesity ([place])   . Myocardial infarction ([place]) 2012  . OSA on CPAP   . Polyneuropathy in diabetes(357.2)   . Pulmonary sarcoidosis ([place])    "problems w/it years ago; no problems anymore" (07/03/2014)  . Type II diabetes mellitus ([place])     Past Surgical History:  Procedure Laterality Date  . AMPUTATION Right 07/24/2016   Procedure: TRANSMETATARSAL FOOT AMPUTATION;  Surgeon: [name], MD;  Location: [facility];  Service: Orthopedics;  Laterality: Right;  . BREAST LUMPECTOMY Right ~ 2000   "benign"  . CARDIAC CATHETERIZATION N/A 07/30/2015   Procedure: Left Heart Cath and Coronary Angiography;  Surgeon: Burnell Blanks, MD;  Location: [facility];  Service: Cardiovascular;  Laterality: N/A;  . CARDIAC CATHETERIZATION N/A 07/19/2016   Procedure: Left Heart Cath and Coronary Angiography;  Surgeon: Belva Crome, MD;  Location: [facility];  Service: Cardiovascular;  Laterality: N/A;  . CATARACT EXTRACTION W/ INTRAOCULAR LENS  IMPLANT, BILATERAL Bilateral 2014-2015  . CORONARY ANGIOPLASTY WITH STENT  PLACEMENT  10/31/2011   30 % MID ATRIOVENTRICULAR GROOVE CX STENOSIS. 90 % MID RCA.PTA TO LAD/STENTING OF TANDEM 60-70%. LAD STENOSIS 99% REDUCED TO 0%.3.0 X 32MM PROMUS DES POSTDILATED TO 3.25MM.  [name] CORONARY ANGIOPLASTY WITH STENT PLACEMENT  07/03/2014   "2"  . I&D EXTREMITY Right 10/30/2013   Procedure: IRRIGATION AND DEBRIDEMENT RIGHT SMALL FINGER POSSIBLE  SECONDARY WOUND CLOSURE;  Surgeon: [name], MD;  Location: [facility];  Service: Orthopedics;  Laterality: Right;  [name] is available after 4pm  . I&D EXTREMITY Right 11/01/2013   Procedure:  IRRIGATION AND DEBRIDEMENT RIGHT  PROXIMAL PHALANGEAL LEVEL AMPUTATION;  Surgeon: [name], MD;  Location: [facility];  Service: Orthopedics;  Laterality: Right;  . INCISION AND DRAINAGE Right 10/28/2013   Procedure: INCISION AND DRAINAGE Right small finger;  Surgeon: [name], MD;  Location: [facility];  Service: Orthopedics;  Laterality: Right;  . LEFT HEART CATH Left 11/03/2011   Procedure: LEFT HEART CATH;  Surgeon: [name], MD;  Location: [facility];  Service: Cardiovascular;  Laterality: Left;  . LEFT HEART CATHETERIZATION WITH CORONARY ANGIOGRAM N/A 07/03/2014   Procedure: LEFT HEART CATHETERIZATION WITH CORONARY ANGIOGRAM;  Surgeon: [name], MD;  Location: [facility] CATH [facility];  Service: Cardiovascular;  Laterality: N/A;  . PERCUTANEOUS CORONARY STENT INTERVENTION (PCI-S) Left 11/03/2011   Procedure: PERCUTANEOUS CORONARY STENT INTERVENTION (PCI-S);  Surgeon: [name], MD;  Location: [facility] C[facility];  Service: Cardiovascular;  Laterality: Left;  . TOE AMPUTATION Right ~ 2010   "2nd toe"  . TOE AMPUTATION Right 2012   "3rd toe"    Family History  Problem Relation Age of Onset  . Heart attack Maternal Aunt   . Diabetes Maternal Grandmother   . Hypertension Maternal Grandmother     Social History:  Married. Independent--sedentary since March when he got laid off. He  reports that he has never smoked. He has never used smokeless tobacco. He reports that he drinks alcohol--3 beers daily. He reports that he does not use drugs.    Allergies: Not on File    Medications Prior to Admission  Medication Sig Dispense Refill  . allopurinol (ZYLOPRIM) 300 MG tablet Take 300 mg by mouth daily.     [name] amLODipine-olmesartan (AZOR) 10-40 MG tablet Take 1 tablet by mouth  daily. 30 tablet 11  . aspirin EC 81 MG tablet Take 81 mg by mouth daily.    [name] doxazosin (CARDURA) 8 MG tablet Take 8 mg by mouth daily.   5  . furosemide (LASIX) 40 MG tablet TAKE 1 TABLET (40 MG TOTAL) BY MOUTH DAILY. 30 tablet 10  . hydrALAZINE (APRESOLINE) 25 MG tablet Take 1.5 tablets (37.5 mg total) by mouth 2 (two) times daily. (Patient taking differently: Take 25 mg by mouth 2 (two) times daily. ) 90 tablet 11  . isosorbide mononitrate (IMDUR) 60 MG 24 hr tablet Take 1 tablet (60 mg total) by mouth daily. 30 tablet 11  . nitroGLYCERIN (NITROSTAT) 0.4 MG SL tablet Place 1 tablet (0.4 mg total) under the tongue every 5 (five) minutes as needed for chest pain. 25 tablet 3  . NOVOLIN N RELION 100 UNIT/ML injection Inject 50-60 Units into the skin See admin instructions. 50 units in the morning and 60 units in the evening  0  . NOVOLIN R RELION 100 UNIT/ML injection Inject 0-10 Units into the skin 3 (three) times daily before meals. Per sliding scale  0  . pantoprazole (PROTONIX) 40  MG tablet Take 1 tablet (40 mg total) by mouth daily. 30 tablet 11  . rosuvastatin (CRESTOR) 40 MG tablet Take 1 tablet (40 mg total) by mouth daily.    . ticagrelor (BRILINTA) 90 MG TABS tablet Take 90 mg by mouth 2 (two) times daily.    . metoprolol succinate (TOPROL-XL) 100 MG 24 hr tablet Take 1 tablet (100 mg total) by mouth daily. Take with or immediately following a meal. (Patient not taking: Reported on 07/18/2016) 30 tablet 6  . ticagrelor (BRILINTA) 60 MG TABS tablet Take 1 tablet (60 mg total) by mouth 2 (two) times daily. (Patient not taking: Reported on 07/18/2016) 60 tablet 11    Home: Home Living Family/patient expects to be discharged to:: Private residence Living Arrangements: Spouse/significant other, Children Available Help at Discharge:  (adult children can be home during the day.) Type of Home: House Home Access: Stairs to enter [employer] of Steps: 2 Entrance Stairs-Rails:  None Home Layout:  (has 1 step in home to get to bedroom /bathroom) Bathroom Shower/Tub: Tub/shower unit, Architectural technologist: Handicapped height Home Equipment: Crutches  Functional History: Prior Function Level of Independence: Independent Comments:  (from [place]) Functional Status:  Mobility: Bed Mobility Overal bed mobility: Modified Independent Bed Mobility: Supine to Sit Supine to sit: Modified independent (Device/Increase time), HOB elevated Sit to supine: Min assist General bed mobility comments: OOB when PT arrived Transfers Overall transfer level: Needs assistance Equipment used: Rolling walker (2 wheeled) Transfers: Sit to/from Stand, [employer] Transfers Sit to Stand: Min assist Stand pivot transfers: Min assist General transfer comment: Verbal cues for hand placement. Assist to bring hips up. Ambulation/Gait Ambulation/Gait assistance: +2 physical assistance, Min assist Ambulation Distance (Feet): 4 Feet Assistive device: Rolling walker (2 wheeled) Gait Pattern/deviations: Step-to pattern General Gait Details: declined Gait velocity: decr Gait velocity interpretation: Below normal speed for age/gender    ADL: ADL Overall ADL's : Needs assistance/impaired Grooming: Set up, Sitting Upper Body Bathing: Set up, Sitting Lower Body Bathing: Moderate assistance, Sit to/from stand Upper Body Dressing : Set up, Sitting Lower Body Dressing: Moderate assistance, Sit to/from stand Toilet Transfer: +2 for physical assistance, Minimal assistance, Stand-pivot Toileting- Clothing Manipulation and Hygiene: Minimal assistance, +2 for safety/equipment Functional mobility during ADLs: Minimal assistance, Rolling walker General ADL Comments: improved ability to complete stand pivot transfer, maintaining NWB status R foot. DOE.  Cognition: Cognition Overall Cognitive Status: Within Functional Limits for tasks assessed Orientation Level: Oriented  X4 Cognition Arousal/Alertness: Awake/alert Behavior During Therapy: WFL for tasks assessed/performed Overall Cognitive Status: Within Functional Limits for tasks assessed   Blood pressure 130/68, pulse 76, temperature 98.7 F (37.1 C), temperature source Oral, resp. rate 18, height $RemoveBe'5\' 9"'CLPJTpcrU$  (1.753 m), weight (!) 142.1 kg (313 lb 4.4 oz), SpO2 99 %. Physical Exam  Nursing note and vitals reviewed. Constitutional: He is oriented to person, place, and time. He appears well-developed and well-nourished.  obese  HENT:  Head: Normocephalic and atraumatic.  Mouth/Throat: Oropharynx is clear and moist.  Eyes: Conjunctivae and EOM are normal. Pupils are equal, round, and reactive to light. Right eye exhibits no discharge.  Neck: Normal range of motion. Neck supple.  Cardiovascular: Normal rate and regular rhythm.   No murmur heard. Respiratory: Effort normal and breath sounds normal. No respiratory distress. He has no wheezes.  GI: Soft. Bowel sounds are normal. He exhibits no distension. There is no tenderness.  Musculoskeletal: He exhibits edema. He exhibits no tenderness.  1+ pitting edema right  shin/foot. VAC in place on transmet site.   Neurological: He is alert and oriented to person, place, and time. No cranial nerve deficit. Coordination normal.  Alert and oriented. Follows all commands. UE 5/5. RLE: 3/5HF, KE -- did not test ankle due vac. LLE: 4/5 prox to distal. Senses pain in both lower ext but decreased LT distally LLE.  Skin: Skin is warm and dry. No erythema.  Vac dressing over the right foot, sealed/intact  Psychiatric: He has a normal mood and affect. His behavior is normal. Judgment and thought content normal.    Results for orders placed or performed during the hospital encounter of 07/18/16 (from the past 24 hour(s))  Glucose, capillary     Status: Abnormal   Collection Time: 07/28/16  4:22 PM  Result Value Ref Range   Glucose-Capillary 120 (H) 65 - 99 mg/dL   Comment 1  Notify RN    Comment 2 Document in Chart   Glucose, capillary     Status: Abnormal   Collection Time: 07/28/16  8:48 PM  Result Value Ref Range   Glucose-Capillary 124 (H) 65 - 99 mg/dL   Comment 1 Notify RN   CBC     Status: Abnormal   Collection Time: 07/29/16  3:16 AM  Result Value Ref Range   WBC 10.9 (H) 4.0 - 10.5 K/uL   RBC 3.42 (L) 4.22 - 5.81 MIL/uL   Hemoglobin 9.2 (L) 13.0 - 17.0 g/dL   HCT 29.6 (L) 39.0 - 52.0 %   MCV 86.5 78.0 - 100.0 fL   MCH 26.9 26.0 - 34.0 pg   MCHC 31.1 30.0 - 36.0 g/dL   RDW 14.5 11.5 - 15.5 %   Platelets 317 150 - 400 K/uL  Heparin level (unfractionated)     Status: None   Collection Time: 07/29/16  3:16 AM  Result Value Ref Range   Heparin Unfractionated 0.38 0.30 - 0.70 IU/mL  Basic metabolic panel     Status: Abnormal   Collection Time: 07/29/16  3:16 AM  Result Value Ref Range   Sodium 140 135 - 145 mmol/L   Potassium 3.6 3.5 - 5.1 mmol/L   Chloride 104 101 - 111 mmol/L   CO2 26 22 - 32 mmol/L   Glucose, Bld 109 (H) 65 - 99 mg/dL   BUN 8 6 - 20 mg/dL   Creatinine, Ser 1.61 (H) 0.61 - 1.24 mg/dL   Calcium 8.7 (L) 8.9 - 10.3 mg/dL   GFR calc non Af Amer 46 (L) >60 mL/min   GFR calc Af Amer 53 (L) >60 mL/min   Anion gap 10 5 - 15  Glucose, capillary     Status: Abnormal   Collection Time: 07/29/16  6:02 AM  Result Value Ref Range   Glucose-Capillary 113 (H) 65 - 99 mg/dL   Comment 1 Notify RN   Glucose, capillary     Status: Abnormal   Collection Time: 07/29/16 11:28 AM  Result Value Ref Range   Glucose-Capillary 128 (H) 65 - 99 mg/dL   No results found.  Assessment/Plan:  Diagnosis: PAD requiring right transmetatarsal amputation 1. Does the need for close, 24 hr/day medical supervision in concert with the patient's rehab needs make it unreasonable for this patient to be served in a less intensive setting? Yes 2. Co-Morbidities requiring supervision/potential complications: morbid obesity, CAD, CKD, DM 1  3. Due to bladder  management, bowel management, safety, skin/wound care, disease management, medication administration, pain management and patient education, does the patient  require 24 hr/day rehab nursing? Yes 4. Does the patient require coordinated care of a physician, rehab nurse, PT (1-2 hrs/day, 5 days/week) and OT (1-2 hrs/day, 5 days/week) to address physical and functional deficits in the context of the above medical diagnosis(es)? Yes Addressing deficits in the following areas: balance, endurance, locomotion, strength, transferring, bowel/bladder control, bathing, dressing, feeding, grooming, toileting and psychosocial support 5. Can the patient actively participate in an intensive therapy program of at least 3 hrs of therapy per day at least 5 days per week? Yes 6. The potential for patient to make measurable gains while on inpatient rehab is excellent 7. Anticipated functional outcomes upon discharge from inpatient rehab are modified independent  with PT, modified independent with OT, n/a with SLP. 8. Estimated rehab length of stay to reach the above functional goals is: 7 days 9. Does the patient have adequate social supports and living environment to accommodate these discharge functional goals? Yes 10. Anticipated D/C setting: Home 11. Anticipated post D/C treatments: [place] therapy 12. Overall Rehab/Functional Prognosis: excellent  RECOMMENDATIONS: This patient's condition is appropriate for continued rehabilitative care in the following setting: CIR Patient has agreed to participate in recommended program. Yes Note that insurance prior authorization may be required for reimbursement for recommended care.  Comment: Rehab Admissions Coordinator to follow up.  Thanks,  Meredith Staggers, MD, Mellody Drown     07/29/2016

## 2016-07-29 NOTE — Progress Notes (Signed)
Physical Therapy Treatment Patient Details Name: Joshua [name] MRN: [id number] DOB: Dec 11, 1956 Today's Date: 07/29/2016    History of Present Illness Patient is a 60 yo male admitted 07/18/16 with unstable angina.  Cath showed 3-vessel disease.  Patient declining CABG.  Plan for PCI cancelled - patient with Rt foot wound with drainage - osteomyelitis.  Patient s/p Rt transmetatarsal amputation 07/24/16.  PCI still pending.    PMH:  CAD, MI, cardiac stents, DM, neuropathy, gastroparesis, Rt foot recurrent ulcers, CHF, HTN, morbid obesity, OSA on CPAP    PT Comments    Pt admitted with above diagnosis. Pt currently with functional limitations due to balance and endurance deficits.Pt was able to ambulate x2 with RW with min assist and chair follow. Fatigues quickly with 10 feet being longest distance pt can tolerate.  Needs Rehab.  Pt will benefit from skilled PT to increase their independence and safety with mobility to allow discharge to the venue listed below.    Follow Up Recommendations  CIR     Equipment Recommendations  Rolling walker with 5" wheels;3in1 (PT)    Recommendations for Other Services Rehab consult     Precautions / Restrictions Precautions Precautions: Fall Precaution Comments: Patient with DOE awaiting PCI.  New transmetatarsal amputation. Stints scheduled 8/17; VAC on right foot Restrictions Weight Bearing Restrictions: Yes RLE Weight Bearing: Non weight bearing    Mobility  Bed Mobility Overal bed mobility: Modified Independent Bed Mobility: Supine to Sit     Supine to sit: Modified independent (Device/Increase time);HOB elevated        Transfers Overall transfer level: Needs assistance Equipment used: Rolling walker (2 wheeled) Transfers: Sit to/from Stand Sit to Stand: Min assist         General transfer comment: Verbal cues for hand placement. Assist to bring hips up.  Had a harder time with sit to stand from recliner thanfrom bed.    Ambulation/Gait Ambulation/Gait assistance: Min assist;+2 safety/equipment Ambulation Distance (Feet): 15 Feet (10 feet then 5 feet) Assistive device: Rolling walker (2 wheeled) Gait Pattern/deviations: Step-to pattern   Gait velocity interpretation: Below normal speed for age/gender General Gait Details: Pt is able to maintain NWB right LE with ambulation but fatigues quickly needing rest breaks.  At times needs steadying assist for balance as well. Followed with chair.    Stairs            Wheelchair Mobility    Modified Rankin (Stroke Patients Only)       Balance Overall balance assessment: Needs assistance Sitting-balance support: No upper extremity supported;Feet supported Sitting balance-Leahy Scale: Good     Standing balance support: Bilateral upper extremity supported;During functional activity Standing balance-Leahy Scale: Poor Standing balance comment: relies on RW for balance.  Needs RW for static balance as well due to NWB.                     Cognition Arousal/Alertness: Awake/alert Behavior During Therapy: WFL for tasks assessed/performed Overall Cognitive Status: Within Functional Limits for tasks assessed                      Exercises General Exercises - Lower Extremity Ankle Circles/Pumps: AROM;Both;5 reps Quad Sets: AROM;Both;20 reps Gluteal Sets: AROM;Both;15 reps Heel Slides: AAROM;AROM;Both;10 reps    General Comments        Pertinent Vitals/Pain Pain Assessment: Faces Faces Pain Scale: Hurts little more Pain Location: right foot Pain Descriptors / Indicators: Aching;Sore Pain Intervention(s): Limited activity within  patient's tolerance;Monitored during session;Premedicated before session;RepositionedVSS    Home Living                      Prior Function            PT Goals (current goals can now be found in the care plan section) Progress towards PT goals: Progressing toward goals    Frequency   Min 3X/week    PT Plan Current plan remains appropriate    Co-evaluation             End of Session Equipment Utilized During Treatment: Gait belt Activity Tolerance: Patient tolerated treatment well Patient left: in chair;with call bell/phone within reach;with chair alarm set;with nursing/sitter in room     Time: 7579-7282 PT Time Calculation (min) (ACUTE ONLY): 19 min  Charges:  $Gait Training: 8-22 mins                    G Codes:      WhiteArrie Aran F 2016/08/11, 1:45 PM [employer] Acute Rehabilitation [phone] (pager)

## 2016-07-29 NOTE — Progress Notes (Signed)
Subjective:  No chest pain; feels well; no dyspnea  Objective:   Vital Signs : Vitals:   07/28/16 1009 07/28/16 1236 07/28/16 2017 07/29/16 0554  BP: (!) 154/68 137/86 (!) 141/79 130/68  Pulse: 79 73 77 76  Resp: _0 Temp: 98.5 F (36.9 C) 98.4 F (36.9 C) 98.7 F (37.1 C) 98.7 F (37.1 C)  TempSrc: Oral Oral Oral Oral  SpO2: 98% 97% 95% 99%  Weight:    (!) 313 lb 4.4 oz (142.1 kg)  Height:        Intake/Output from previous day:  Intake/Output Summary (Last 24 hours) at 07/29/16 0830 Last data filed at 07/29/16 0816  Gross per 24 hour  Intake          2405.83 ml  Output             5395 ml  Net         -2989.17 ml    I/O since admission: -6048  Wt Readings from Last 3 Encounters:  07/29/16 (!) 313 lb 4.4 oz (142.1 kg)  02/16/16 297 lb (134.7 kg)  10/14/15 279 lb 14.4 oz (127 kg)    Medications: . allopurinol  300 mg Oral Daily  . amLODipine  10 mg Oral Daily  . antiseptic oral rinse  7 mL Mouth Rinse BID  . aspirin  81 mg Oral Daily  . ceFTAROline (TEFLARO) IV  600 mg Intravenous Q12H  . doxazosin  8 mg Oral Daily  . hydrALAZINE  37.5 mg Oral BID  . insulin aspart  0-20 Units Subcutaneous TID WC  . insulin NPH Human  22 Units Subcutaneous QHS   And  . insulin NPH Human  20 Units Subcutaneous QAC breakfast  . isosorbide mononitrate  90 mg Oral Daily  . metoprolol succinate  75 mg Oral Daily  . pantoprazole  40 mg Oral Daily  . potassium chloride  20 mEq Oral Daily  . rosuvastatin  40 mg Oral q1800  . sodium chloride flush  3 mL Intravenous Q12H  . sodium chloride flush  3 mL Intravenous Q12H  . ticagrelor  90 mg Oral BID    . sodium chloride 10 mL (07/24/16 1118)  . sodium chloride 1 mL/kg/hr (07/28/16 1739)  . heparin 1,900 Units/hr (07/28/16 6659)    Physical Exam:   General appearance: alert, cooperative, no distress and morbidly obese Neck: no adenopathy, no JVD, supple, symmetrical, trachea midline and thyroid not enlarged,  symmetric, no tenderness/mass/nodules Lungs: clear to auscultation bilaterally Heart: regular rate and rhythm and 1/6 Abdomen: soft, non-tender; bowel sounds normal; no masses,  no organomegaly Extremities: R MT amputation Neurologic: Grossly normal   Rate: 85  Rhythm: NSR  ECG (independently read by me): NSR at 81; PRWP anteriorly  ECG (independently read by me): ST at 118    Lab Results:   Recent Labs  07/27/16 0244 07/28/16 0442 07/29/16 0316  NA 137 138 140  K 3.7 3.6 3.6  CL 105 105 104  CO2 _1 GLUCOSE 171* 139* 109*  BUN _2 CREATININE 1.66* 1.78* 1.61*  CALCIUM 8.7* 8.8* 8.7*    Hepatic Function Latest Ref Rng & Units 02/16/2016 10/13/2015 07/03/2014  Total Protein 6.1 - 8.1 g/dL 6.9 7.1 6.6  Albumin 3.6 - 5.1 g/dL 3.5(L) 3.6 2.8(L)  AST 10 - 35 U/L _3 ALT 9 - 46 U/L _4 Alk Phosphatase 40 - 115 U/L 131(H) 112  120(H)  Total Bilirubin 0.2 - 1.2 mg/dL 0.4 0.3 <0.2(L)     Recent Labs  07/27/16 0244 07/28/16 0442 07/29/16 0316  WBC 10.6* 9.7 10.9*  HGB 9.6* 9.5* 9.2*  HCT 30.3* 30.2* 29.6*  MCV 85.8 86.8 86.5  PLT 254 283 317    No results for input(s): TROPONINI in the last 72 hours.  Invalid input(s): CK, MB  Lab Results  Component Value Date   TSH 3.175 07/18/2016   No results for input(s): HGBA1C in the last 72 hours.  No results for input(s): PROT, ALBUMIN, AST, ALT, ALKPHOS, BILITOT, BILIDIR, IBILI in the last 72 hours. No results for input(s): INR in the last 72 hours. BNP (last 3 results) No results for input(s): BNP in the last 8760 hours.  ProBNP (last 3 results) No results for input(s): PROBNP in the last 8760 hours.   Lipid Panel     Component Value Date/Time   CHOL 205 (H) 02/16/2016 0959   TRIG 101 02/16/2016 0959   HDL 102 02/16/2016 0959   CHOLHDL 2.0 02/16/2016 0959   VLDL 20 02/16/2016 0959   LDLCALC 83 02/16/2016 0959      Imaging -------------------------------------------------------------------  07/21/15 ECHO Study Conclusions  - Left ventricle: The cavity size was normal. Wall thickness was   increased in a pattern of moderate LVH. Systolic function was   mildly reduced. The estimated ejection fraction was in the range   of 45% to 50%. There is akinesis of the mid-apicalanteroseptal   myocardium. Doppler parameters are consistent with abnormal left   ventricular relaxation (grade 1 diastolic dysfunction).  EF 50% AT CATH 07/19/2016  Assessment/Plan:   Principal Problem:   Unstable angina pectoris (HCC) Active Problems:   CAD in native artery   Chronic combined systolic and diastolic CHF (congestive heart failure) (HCC)   Coronary artery disease   Hyperlipemia   Renal insufficiency   Penetrating wound of right foot   Type 1 diabetes mellitus with right diabetic foot ulcer (HCC)   Chronic osteomyelitis of toe of right foot (HCC)   CKD (chronic kidney disease) stage 3, GFR 30-59 ml/min   Chronic osteomyelitis of right foot (HCC)   Cellulitis and abscess of leg, except foot  1. CAD with 3 vessel disease;  Plan for PCI to LAD and LCx, deferred from last week due to fever and osteomyelitis. Per ID since pt is s/p amputation and on antibiotics may not need to wait for antibiotic completion. Tolerating Toprol at 75 mg with HR in the 70's.  No angina on heparin, NTG.  Cr improved today 1.78 --> 1.61 with additional hydration.  Plan 2 vessel PCI today with Dr. Tamala Julian.  The risks and benefits of a cardiac catheterization including, but not limited to, death, stroke, MI, kidney damage and bleeding were discussed with the patient who indicates understanding and agrees to proceed.   2. Chronic combined systolic/diastolic Heart failure; currently compensated.   3. Osteomyelitis abscess of MTP joint and great toe/ s/p R TM amputation; plan for 3 weeks of Teflaro per ID; remains afebrile; WBC 10.9.   4.  CKD   Cr iprovroved to 1.61 today.  With EF at 50% at cath and weight 322 lbs;  Continue hydration.  Minimize contrast during procedure.  5.  DM    [name], MD, [facility] 07/29/2016, 8:30 AM

## 2016-07-29 NOTE — Interval H&P Note (Signed)
Cath Lab Visit (complete for each Cath Lab visit)  Clinical Evaluation Leading to the Procedure:   ACS: Yes.    Non-ACS:    Anginal Classification: CCS [name]  Anti-ischemic medical therapy: Maximal Therapy (2 or more classes of medications)  Non-Invasive Test Results: No non-invasive testing performed  Prior CABG: No previous CABG      History and Physical Interval Note:  07/29/2016 3:45 PM  Joshua [name]  has presented today for surgery, with the diagnosis of cad  The various methods of treatment have been discussed with the patient and family. After consideration of risks, benefits and other options for treatment, the patient has consented to  Procedure(s): Coronary Stent Intervention (N/A) as a surgical intervention .  The patient's history has been reviewed, patient examined, no change in status, stable for surgery.  I have reviewed the patient's chart and labs.  Questions were answered to the patient's satisfaction.     Joshua [name]

## 2016-07-29 NOTE — Research (Signed)
LEADERS FREE Informed Consent   Subject Name: Joshua [name]  Subject met inclusion and exclusion criteria.  The informed consent form, study requirements and expectations were reviewed with the subject and questions and concerns were addressed prior to the signing of the consent form.  The subject verbalized understanding of the trail requirements.  The subject agreed to participate in the LEADERS Free trial and signed the informed consent.  The informed consent was obtained prior to performance of any protocol-specific procedures for the subject.  A copy of the signed informed consent was given to the subject and a copy was placed in the subject's medical record.    07/29/2016, 7:20  

## 2016-07-29 NOTE — H&P (View-Only) (Signed)
 Subjective:  No chest pain; feels well; no dyspnea  Objective:   Vital Signs : Vitals:   07/28/16 1009 07/28/16 1236 07/28/16 2017 07/29/16 0554  BP: (!) 154/68 137/86 (!) 141/79 130/68  Pulse: 79 73 77 76  Resp: 18 19 18 18  Temp: 98.5 F (36.9 C) 98.4 F (36.9 C) 98.7 F (37.1 C) 98.7 F (37.1 C)  TempSrc: Oral Oral Oral Oral  SpO2: 98% 97% 95% 99%  Weight:    (!) 313 lb 4.4 oz (142.1 kg)  Height:        Intake/Output from previous day:  Intake/Output Summary (Last 24 hours) at 07/29/16 0830 Last data filed at 07/29/16 0816  Gross per 24 hour  Intake          2405.83 ml  Output             5395 ml  Net         -2989.17 ml    I/O since admission: -6048  Wt Readings from Last 3 Encounters:  07/29/16 (!) 313 lb 4.4 oz (142.1 kg)  02/16/16 297 lb (134.7 kg)  10/14/15 279 lb 14.4 oz (127 kg)    Medications: . allopurinol  300 mg Oral Daily  . amLODipine  10 mg Oral Daily  . antiseptic oral rinse  7 mL Mouth Rinse BID  . aspirin  81 mg Oral Daily  . ceFTAROline (TEFLARO) IV  600 mg Intravenous Q12H  . doxazosin  8 mg Oral Daily  . hydrALAZINE  37.5 mg Oral BID  . insulin aspart  0-20 Units Subcutaneous TID WC  . insulin NPH Human  22 Units Subcutaneous QHS   And  . insulin NPH Human  20 Units Subcutaneous QAC breakfast  . isosorbide mononitrate  90 mg Oral Daily  . metoprolol succinate  75 mg Oral Daily  . pantoprazole  40 mg Oral Daily  . potassium chloride  20 mEq Oral Daily  . rosuvastatin  40 mg Oral q1800  . sodium chloride flush  3 mL Intravenous Q12H  . sodium chloride flush  3 mL Intravenous Q12H  . ticagrelor  90 mg Oral BID    . sodium chloride 10 mL (07/24/16 1118)  . sodium chloride 1 mL/kg/hr (07/28/16 1739)  . heparin 1,900 Units/hr (07/28/16 0625)    Physical Exam:   General appearance: alert, cooperative, no distress and morbidly obese Neck: no adenopathy, no JVD, supple, symmetrical, trachea midline and thyroid not enlarged,  symmetric, no tenderness/mass/nodules Lungs: clear to auscultation bilaterally Heart: regular rate and rhythm and 1/6 Abdomen: soft, non-tender; bowel sounds normal; no masses,  no organomegaly Extremities: R MT amputation Neurologic: Grossly normal   Rate: 85  Rhythm: NSR  ECG (independently read by me): NSR at 81; PRWP anteriorly  ECG (independently read by me): ST at 118    Lab Results:   Recent Labs  07/27/16 0244 07/28/16 0442 07/29/16 0316  NA 137 138 140  K 3.7 3.6 3.6  CL 105 105 104  CO2 23 26 26  GLUCOSE 171* 139* 109*  BUN 12 13 8  CREATININE 1.66* 1.78* 1.61*  CALCIUM 8.7* 8.8* 8.7*    Hepatic Function Latest Ref Rng & Units 02/16/2016 10/13/2015 07/03/2014  Total Protein 6.1 - 8.1 g/dL 6.9 7.1 6.6  Albumin 3.6 - 5.1 g/dL 3.5(L) 3.6 2.8(L)  AST 10 - 35 U/L 20 10 23  ALT 9 - 46 U/L 23 11 21  Alk Phosphatase 40 - 115 U/L 131(H) 112   120(H)  Total Bilirubin 0.2 - 1.2 mg/dL 0.4 0.3 <0.2(L)     Recent Labs  07/27/16 0244 07/28/16 0442 07/29/16 0316  WBC 10.6* 9.7 10.9*  HGB 9.6* 9.5* 9.2*  HCT 30.3* 30.2* 29.6*  MCV 85.8 86.8 86.5  PLT 254 283 317    No results for input(s): TROPONINI in the last 72 hours.  Invalid input(s): CK, MB  Lab Results  Component Value Date   TSH 3.175 07/18/2016   No results for input(s): HGBA1C in the last 72 hours.  No results for input(s): PROT, ALBUMIN, AST, ALT, ALKPHOS, BILITOT, BILIDIR, IBILI in the last 72 hours. No results for input(s): INR in the last 72 hours. BNP (last 3 results) No results for input(s): BNP in the last 8760 hours.  ProBNP (last 3 results) No results for input(s): PROBNP in the last 8760 hours.   Lipid Panel     Component Value Date/Time   CHOL 205 (H) 02/16/2016 0959   TRIG 101 02/16/2016 0959   HDL 102 02/16/2016 0959   CHOLHDL 2.0 02/16/2016 0959   VLDL 20 02/16/2016 0959   LDLCALC 83 02/16/2016 0959      Imaging -------------------------------------------------------------------  07/21/15 ECHO Study Conclusions  - Left ventricle: The cavity size was normal. Wall thickness was   increased in a pattern of moderate LVH. Systolic function was   mildly reduced. The estimated ejection fraction was in the range   of 45% to 50%. There is akinesis of the mid-apicalanteroseptal   myocardium. Doppler parameters are consistent with abnormal left   ventricular relaxation (grade 1 diastolic dysfunction).  EF 50% AT CATH 07/19/2016  Assessment/Plan:   Principal Problem:   Unstable angina pectoris (HCC) Active Problems:   CAD in native artery   Chronic combined systolic and diastolic CHF (congestive heart failure) (HCC)   Coronary artery disease   Hyperlipemia   Renal insufficiency   Penetrating wound of right foot   Type 1 diabetes mellitus with right diabetic foot ulcer (HCC)   Chronic osteomyelitis of toe of right foot (HCC)   CKD (chronic kidney disease) stage 3, GFR 30-59 ml/min   Chronic osteomyelitis of right foot (HCC)   Cellulitis and abscess of leg, except foot  1. CAD with 3 vessel disease;  Plan for PCI to LAD and LCx, deferred from last week due to fever and osteomyelitis. Per ID since pt is s/p amputation and on antibiotics may not need to wait for antibiotic completion. Tolerating Toprol at 75 mg with HR in the 70's.  No angina on heparin, NTG.  Cr improved today 1.78 --> 1.61 with additional hydration.  Plan 2 vessel PCI today with Dr. Smith.  The risks and benefits of a cardiac catheterization including, but not limited to, death, stroke, MI, kidney damage and bleeding were discussed with the patient who indicates understanding and agrees to proceed.   2. Chronic combined systolic/diastolic Heart failure; currently compensated.   3. Osteomyelitis abscess of MTP joint and great toe/ s/p R TM amputation; plan for 3 weeks of Teflaro per ID; remains afebrile; WBC 10.9.   4.  CKD   Cr iprovroved to 1.61 today.  With EF at 50% at cath and weight 322 lbs;  Continue hydration.  Minimize contrast during procedure.  5.  DM    Thomas A. Kelly, MD, FACC 07/29/2016, 8:30 AM 

## 2016-07-29 NOTE — Progress Notes (Signed)
ANTICOAGULATION CONSULT NOTE - Follow Up Consult  Pharmacy Consult for Heparin Indication: 3V CAD  Not on File  Patient Measurements: Height: 5\' 9"  (175.3 cm) Weight: (!) 313 lb 4.4 oz (142.1 kg) IBW/kg (Calculated) : 70.7  Heparin dosing weight: 105kg  Vital Signs: Temp: 98.7 F (37.1 C) (08/17 0554) Temp Source: Oral (08/17 0554) BP: 130/68 (08/17 0554) Pulse Rate: 76 (08/17 0554)  Labs:  Recent Labs  07/27/16 0244 07/27/16 1520 07/28/16 0442 07/28/16 0444 07/29/16 0316  HGB 9.6*  --  9.5*  --  9.2*  HCT 30.3*  --  30.2*  --  29.6*  PLT 254  --  283  --  317  HEPARINUNFRC 0.80* 0.67  --  0.41 0.38  CREATININE 1.66*  --  1.78*  --  1.61*    Estimated Creatinine Clearance: 71.1 mL/min (by C-G formula based on SCr of 1.61 mg/dL).  Assessment: 60 yo male s/p cath which showed 3V CAD. Pt refused CABG and plans for PCI 8/16. MRI of foot consistent with osteo and abscess, now s/p transmetatarsal amputation.   Heparin level remains therapeutic on 1900 units/hr. Hg low stable, plt wnl, no bleed documented.  Goal of Therapy:  Heparin level 0.3-0.7 units/ml Monitor platelets by anticoagulation protocol: Yes   Plan:  - Continue heparin at 1900 units/hr - Daily HL/CBC - Monitor s/sx bleeding - PCI for 8/16  Elicia Lamp, PharmD, BCPS Clinical Pharmacist 07/29/2016 9:00 AM

## 2016-07-30 ENCOUNTER — Encounter (HOSPITAL_COMMUNITY): Payer: Self-pay | Admitting: Interventional Cardiology

## 2016-07-30 LAB — BASIC METABOLIC PANEL
Anion gap: 10 (ref 5–15)
BUN: 10 mg/dL (ref 6–20)
CO2: 24 mmol/L (ref 22–32)
Calcium: 8.6 mg/dL — ABNORMAL LOW (ref 8.9–10.3)
Chloride: 104 mmol/L (ref 101–111)
Creatinine, Ser: 1.6 mg/dL — ABNORMAL HIGH (ref 0.61–1.24)
GFR calc Af Amer: 54 mL/min — ABNORMAL LOW (ref >60)
GFR calc non Af Amer: 46 mL/min — ABNORMAL LOW (ref >60)
Glucose, Bld: 98 mg/dL (ref 65–99)
Potassium: 3.6 mmol/L (ref 3.5–5.1)
Sodium: 138 mmol/L (ref 135–145)

## 2016-07-30 LAB — GLUCOSE, CAPILLARY
Glucose-Capillary: 101 mg/dL — ABNORMAL HIGH (ref 65–99)
Glucose-Capillary: 126 mg/dL — ABNORMAL HIGH (ref 65–99)
Glucose-Capillary: 100 mg/dL — ABNORMAL HIGH (ref 65–99)

## 2016-07-30 MED ORDER — GLUCERNA SHAKE PO LIQD
237.0000 mL | Freq: Two times a day (BID) | ORAL | Status: DC
  Administered 2016-07-31 – 2016-08-01 (×4): 237 mL via ORAL
  Filled 2016-07-30 (×2): qty 237

## 2016-07-30 NOTE — Care Management Note (Addendum)
Case Management Note  Patient Details  Name: Joshua [name] MRN: [id number] Date of Birth: 1956-06-06  Subjective/Objective:    Patient is s/p stent intervention, from home, CIR consulted, CSW aware .  Patient was on Brilinta before and had a different insurance, NCM gave him 30 day savings card, he has 0 co pay for Brilinta NCM will cont to follow for dc needs.                 Action/Plan:   Expected Discharge Date:  07/23/16               Expected Discharge Plan:  [place]  In-House Referral:  Clinical Social Work  Discharge planning Services  CM Consult, Medication Assistance  Post Acute Care Choice:    Choice offered to:     DME Arranged:    DME Agency:     HH Arranged:    [place] Agency:     Status of Service:  In process, will continue to follow  If discussed at Long Length of Stay Meetings, dates discussed:    Additional Comments:  Zenon Mayo, RN 07/30/2016, 2:14 PM

## 2016-07-30 NOTE — Progress Notes (Signed)
Nutrition Follow-up  DOCUMENTATION CODES:   Morbid obesity  INTERVENTION:   Glucerna Shake po BID, each supplement provides 220 kcal and 10 grams of protein  NUTRITION DIAGNOSIS:   Increased nutrient needs related to wound healing as evidenced by estimated needs. Ongoing.   GOAL:   Patient will meet greater than or equal to 90% of their needs Progressing.   MONITOR:   Diet advancement, PO intake, Labs, Weight trends, Skin, I & O's  ASSESSMENT:   60 yo Male who has previously amputations of the second and third toes who has osteomyelitis abscess of the MTP joint and great toe and presents at this time for transmetatarsal amputation. Patient also has severe cardiac disease and is awaiting surgery for the foot to proceed with the cardiac surgery.  Patient s/p procedures 8/12: Transmetatarsal amputation Application of Prevena wound VAC  Meal Completion: 50-100% Pt had a grilled chicken salad with fruit for lunch. Reports decreased appetite. Pt has drank Glucerna in the past and agreeable to drink it when not eating well.   Medications reviewed  Labs reviewed: CBG's: 101-120   Diet Order:  Diet heart healthy/carb modified Room service appropriate? Yes; Fluid consistency: Thin  Skin:  Wound (see comment) (R foot/toe wound, VAC)  Last BM:  8/16  Height:   Ht Readings from Last 1 Encounters:  07/18/16 $RemoveB'5\' 9"'[id number]$  (1.753 m)    Weight:   Wt Readings from Last 1 Encounters:  07/30/16 (!) 306 lb 3.5 oz (138.9 kg)    Ideal Body Weight:  72.7 kg  BMI:  Body mass index is 45.22 kg/m.  Estimated Nutritional Needs:   Kcal:  7026-3785  Protein:  120-130 gm  Fluid:  2.3-2.5 L  EDUCATION NEEDS:   No education needs identified at this time  [place], [place], [place] Pager [phone] After Hours Pager

## 2016-07-30 NOTE — Progress Notes (Signed)
S/W ETHAN @ [facility] # [phone]   BRILINTA 90 MG BID (30 ) 60 TAB   COVER- YES  CO-PAY- ZERO DOLLARS  TIER- 4 DRUG  PRIOR APPROVAL - NO  PHARMACY: ANY RETAIL

## 2016-07-30 NOTE — Progress Notes (Signed)
Subjective:  No chest pain; feels well; no dyspnea  Objective:   Vital Signs : Vitals:   07/30/16 0215 07/30/16 0400 07/30/16 0423 07/30/16 0829  BP: (!) 116/47 (!) 115/42 (!) 109/33 (!) 166/58  Pulse:   81 60  Resp: '14 16 18 16  ' Temp:   97.9 F (36.6 C) 97.7 F (36.5 C)  TempSrc:   Axillary Oral  SpO2:   92% 92%  Weight:   (!) 306 lb 3.5 oz (138.9 kg)   Height:        Intake/Output from previous day:  Intake/Output Summary (Last 24 hours) at 07/30/16 1036 Last data filed at 07/30/16 0430  Gross per 24 hour  Intake               75 ml  Output             1425 ml  Net            -1350 ml    I/O since admission: -7798   Wt Readings from Last 3 Encounters:  07/30/16 (!) 306 lb 3.5 oz (138.9 kg)  02/16/16 297 lb (134.7 kg)  10/14/15 279 lb 14.4 oz (127 kg)    Medications: . allopurinol  300 mg Oral Daily  . amLODipine  10 mg Oral Daily  . antiseptic oral rinse  7 mL Mouth Rinse BID  . aspirin  81 mg Oral Daily  . ceFTAROline (TEFLARO) IV  600 mg Intravenous Q12H  . doxazosin  8 mg Oral Daily  . heparin  5,000 Units Subcutaneous Q8H  . hydrALAZINE  37.5 mg Oral BID  . insulin aspart  0-20 Units Subcutaneous TID WC  . insulin NPH Human  22 Units Subcutaneous QHS   And  . insulin NPH Human  20 Units Subcutaneous QAC breakfast  . isosorbide mononitrate  90 mg Oral Daily  . metoprolol succinate  75 mg Oral Daily  . pantoprazole  40 mg Oral Daily  . potassium chloride  20 mEq Oral Daily  . rosuvastatin  40 mg Oral q1800  . sodium chloride flush  3 mL Intravenous Q12H  . ticagrelor  90 mg Oral BID    . sodium chloride 10 mL (07/24/16 1118)  . sodium chloride Stopped (07/30/16 0416)    Physical Exam:   General appearance: alert, cooperative, no distress and morbidly obese Neck: no adenopathy, no JVD, supple, symmetrical, trachea midline and thyroid not enlarged, symmetric, no tenderness/mass/nodules Lungs: clear to auscultation bilaterally Heart: regular  rate and rhythm and 1/6 Abdomen: soft, non-tender; bowel sounds normal; no masses,  no organomegaly Extremities: R MT amputation R radial cath site,stable, minimal ecchymosis anteriorly Neurologic: Grossly normal   Rate: 85  Rhythm: NSR  ECG (independently read by me): NSR at 81; PRWP anteriorly  ECG (independently read by me): ST at 118    Lab Results:   Recent Labs  07/28/16 0442 07/29/16 0316 07/30/16 0411  NA 138 140 138  K 3.6 3.6 3.6  CL 105 104 104  CO2 '26 26 24  ' GLUCOSE 139* 109* 98  BUN '13 8 10  ' CREATININE 1.78* 1.61* 1.60*  CALCIUM 8.8* 8.7* 8.6*    Hepatic Function Latest Ref Rng & Units 02/16/2016 10/13/2015 07/03/2014  Total Protein 6.1 - 8.1 g/dL 6.9 7.1 6.6  Albumin 3.6 - 5.1 g/dL 3.5(L) 3.6 2.8(L)  AST 10 - 35 U/L '20 10 23  ' ALT 9 - 46 U/L '23 11 21  ' Alk Phosphatase 40 - 115 U/L  131(H) 112 120(H)  Total Bilirubin 0.2 - 1.2 mg/dL 0.4 0.3 <0.2(L)     Recent Labs  07/28/16 0442 07/29/16 0316  WBC 9.7 10.9*  HGB 9.5* 9.2*  HCT 30.2* 29.6*  MCV 86.8 86.5  PLT 283 317    No results for input(s): TROPONINI in the last 72 hours.  Invalid input(s): CK, MB  Lab Results  Component Value Date   TSH 3.175 07/18/2016   No results for input(s): HGBA1C in the last 72 hours.  No results for input(s): PROT, ALBUMIN, AST, ALT, ALKPHOS, BILITOT, BILIDIR, IBILI in the last 72 hours. No results for input(s): INR in the last 72 hours. BNP (last 3 results) No results for input(s): BNP in the last 8760 hours.  ProBNP (last 3 results) No results for input(s): PROBNP in the last 8760 hours.   Lipid Panel     Component Value Date/Time   CHOL 205 (H) 02/16/2016 0959   TRIG 101 02/16/2016 0959   HDL 102 02/16/2016 0959   CHOLHDL 2.0 02/16/2016 0959   VLDL 20 02/16/2016 0959   LDLCALC 83 02/16/2016 0959     Imaging -------------------------------------------------------------------  07/21/15 ECHO Study Conclusions  - Left ventricle: The cavity size  was normal. Wall thickness was   increased in a pattern of moderate LVH. Systolic function was   mildly reduced. The estimated ejection fraction was in the range   of 45% to 50%. There is akinesis of the mid-apicalanteroseptal   myocardium. Doppler parameters are consistent with abnormal left   ventricular relaxation (grade 1 diastolic dysfunction).  EF 50% AT CATH 07/19/2016 -------------------------------------  8/17//17 PCI:     Mid RCA lesion, 50 %stenosed.  Mid RCA to Dist RCA lesion, 10 %stenosed.  RPDA lesion, 30 %stenosed.  Mid Cx lesion, 75 %stenosed.  3rd Mrg lesion, 30 %stenosed.  Ost 1st Diag to 1st Diag lesion, 40 %stenosed.  Dist LAD lesion, 10 %stenosed.  Ost LAD to Prox LAD lesion, 85 %stenosed.  A STENT SYNERGY DES 3.5X20 drug eluting stent was successfully placed, and overlaps previously placed stent.  Prox LAD to Mid LAD lesion, 85 %stenosed.  Post intervention, there is a 0% residual stenosis.  A STENT SYNERGY DES [id number] drug eluting stent was successfully placed, and does not overlap previously placed stent.  2nd Mrg lesion, 95 %stenosed.  Post intervention, there is a 0% residual stenosis.    Successful stenting of proximal 85% LAD stenosis with a Synergy 3.5 x 20 drug-eluting stent from the LAD ostium to the proximal vessel overlapping previously placed stents. A 0% stenosis is noted post implantation. Final postdilatation diameter of 3.75 mm.  Successful stenting of the second obtuse marginal from 90% to 0% using a Synergy 2.75 x 16 mm DES deployed at 14 atm. Postdilatation was not performed.  Total contrast 95 cc  RECOMMENDATIONS:  IV fluids at 75 cc by our for 18 hours. Basic metabolic panel in a.m.       Assessment/Plan:   Principal Problem:   Unstable angina pectoris (HCC) Active Problems:   CAD in native artery   Chronic combined systolic and diastolic CHF (congestive heart failure) (HCC)   Coronary artery disease    Hyperlipemia   Renal insufficiency   Penetrating wound of right foot   Type 1 diabetes mellitus with right diabetic foot ulcer (HCC)   Chronic osteomyelitis of toe of right foot (HCC)   CKD (chronic kidney disease) stage 3, GFR 30-59 ml/min   Chronic osteomyelitis of right foot ([place])  Cellulitis and abscess of leg, except foot  1. CAD with 3 vessel disease:  S/p PCI to proximal LAD and LCx-OM2 yesterday with Synergy DES stents. No chest pain, no dyspnea.  2. Chronic combined systolic/diastolic Heart failure; currently compensated.   3. Osteomyelitis abscess of MTP joint and great toe/ s/p R TM amputation; plan for 3 weeks of Teflaro per ID; remains afebrile; WBC 10.9.   Undergoing physical medicine and rehab consult.  4.  CKD  Cr  1.60 today; no increase yet post PCI yesterday  5.  DM   Probable transfer off 6c later today.  [name], MD, [facility] 07/30/2016, 10:36 AM

## 2016-07-30 NOTE — Progress Notes (Signed)
Pt n/a for walking with CR right now due NWB status. Discussed MI, stent, Brilinta, HF, diet, NTG and CRPII. Voiced understanding and requests his referral be sent to [facility]. Will sign off. [phone] Yves Dill CES, ACSM 9:05 AM  07/30/2016

## 2016-07-30 NOTE — Progress Notes (Signed)
[facility] consult progress note                                                                               Patient Demographics  Joshua [name], is a 60 y.o. male, DOB - 1956/03/20, WNU:[id number]  Admit date - 07/18/2016   Admitting Physician [name], MD  Outpatient Primary MD for the patient is Joshua Austria, MD  Outpatient specialists:   LOS - 11  days    Chief Complaint  Patient presents with  . Chest Pain       Brief summary   60year old male with CAD s/p LAD stent admitted with unstable angina by cardiology service, plan for PCI held off due to right foot infection for which he has now had TM amputation on 8/12 with Dr. Sharol Given. ID and TR[facility] were consulted as well. ID plans empiric Teflaro for 3 weeks, and cardiology will decide this coming week on PCI, likely stenting as patient has declined CABG   Assessment & Plan   Unstable angina pectoris ([place]) and CAD in native artery - stable, management per primary team,Underwent PCI to proximal LAD and LCx-OM2 yesterday     Chronic combined systolic and diastolic CHF (congestive heart failure) ([place]), CAD - Management per cardiology, primary team    acute on chronic kidney disease stage III  - Baseline creatinine around 1.5, on Lasix and IV antibiotics  -Creatinine improving/stable  Right foot wound -  - s/p TM amputation with Dr. Sharol Given on 8/12, wound vac in place. ID recommended 3 weeks Teflaro with weeks labs and ID follow up prior to completion of antibiotics.  - Patient will need a PICC line placed if he needs to be discharged with IV antibiotics to SNF. May be okay with peripheral line if discharged to CIR    Type 1 diabetes mellitus with right diabetic foot ulcer (Joshua [name]) - CBGs stable, continue sliding scale insulin    Chronic osteomyelitis of toe of right foot (Joshua [name]) - s/p TM amputation - Antibiotics per ID  Code Status: full  DVT Prophylaxis:   Family Communication: Discussed in  detail with the patient, all imaging results, lab results explained to the patient   Disposition Plan: Per cardiology  Time Spent in minutes   15 minutes  Procedures:  Cardiac cath 8/7  TM amputation right foot 8/12   Antimicrobials:  Dapto 8/12-8/13  Rocephin 8/12 - 8/13  Teflaro 8/13 for planned 3 weeks  Medications  Scheduled Meds: . allopurinol  300 mg Oral Daily  . amLODipine  10 mg Oral Daily  . antiseptic oral rinse  7 mL Mouth Rinse BID  . aspirin  81 mg Oral Daily  . ceFTAROline (TEFLARO) IV  600 mg Intravenous Q12H  . doxazosin  8 mg Oral Daily  . heparin  5,000 Units Subcutaneous Q8H  . hydrALAZINE  37.5 mg Oral BID  . insulin aspart  0-20 Units Subcutaneous TID WC  . insulin NPH Human  22 Units Subcutaneous QHS   And  . insulin NPH Human  20 Units Subcutaneous QAC breakfast  . isosorbide mononitrate  90 mg Oral Daily  .  metoprolol succinate  75 mg Oral Daily  . pantoprazole  40 mg Oral Daily  . potassium chloride  20 mEq Oral Daily  . rosuvastatin  40 mg Oral q1800  . sodium chloride flush  3 mL Intravenous Q12H  . ticagrelor  90 mg Oral BID   Continuous Infusions: . sodium chloride 10 mL (07/24/16 1118)   PRN Meds:.sodium chloride, [DISCONTINUED] acetaminophen **OR** acetaminophen, acetaminophen, HYDROmorphone (DILAUDID) injection, methocarbamol **OR** methocarbamol (ROBAXIN)  IV, metoCLOPramide **OR** metoCLOPramide (REGLAN) injection, nitroGLYCERIN, ondansetron (ZOFRAN) IV, ondansetron **OR** [DISCONTINUED] ondansetron (ZOFRAN) IV, oxyCODONE-acetaminophen, sodium chloride flush   Antibiotics   Anti-infectives    Start     Dose/Rate Route Frequency Ordered Stop   07/25/16 1000  ceftaroline (TEFLARO) 600 mg in sodium chloride 0.9 % 250 mL IVPB     600 mg 250 mL/hr over 60 Minutes Intravenous Every 12 hours 07/25/16 0913     07/24/16 1400  DAPTOmycin (CUBICIN) 1,000 mg in sodium chloride 0.9 % IVPB  Status:  Discontinued     1,000 mg 240 mL/hr  over 30 Minutes Intravenous Every 24 hours 07/24/16 1232 07/25/16 0913   07/24/16 1230  cefTRIAXone (ROCEPHIN) 2 g in dextrose 5 % 50 mL IVPB  Status:  Discontinued     2 g 100 mL/hr over 30 Minutes Intravenous Every 24 hours 07/24/16 1219 07/25/16 0913   07/24/16 0900  ceFAZolin (ANCEF) 3 g in dextrose 5 % 50 mL IVPB  Status:  Discontinued    Comments:  CAN YOU PLEASE NOT GIVE THIS PRE-OP BUT PERIOPERATIVELY SO WE CAN GET SOME INTRA-OPERATIVE CULTURES? Joshua [name]   3 g 130 mL/hr over 30 Minutes Intravenous To [facility] 07/23/16 1918 07/24/16 0826   07/24/16 0849  ceFAZolin (ANCEF) 1 GM/50ML IVPB    Comments:  Joshua [name]   : cabinet override      07/24/16 0849 07/24/16 2059   07/24/16 0849  ceFAZolin (ANCEF) 2-4 GM/100ML-% IVPB    Comments:  Joshua [name]   : cabinet override      07/24/16 0849 07/24/16 2059   07/24/16 0600  ceFAZolin (ANCEF) 3 g in dextrose 5 % 50 mL IVPB  Status:  Discontinued     3 g 130 mL/hr over 30 Minutes Intravenous On call to O.R. 07/23/16 1749 07/23/16 1918        Subjective:   Joshua [name] was seen and examined today.  No complaints, no fevers or chills or any acute issues overnight. Patient denies dizziness, chest pain, shortness of breath, abdominal pain, N/V/D/C, new weakness, numbess, tingling. No new complaints  Objective:   Vitals:   07/30/16 0215 07/30/16 0400 07/30/16 0423 07/30/16 0829  BP: (!) 116/47 (!) 115/42 (!) 109/33 (!) 166/58  Pulse:   81 60  Resp: $Remo'14 16 18 16  'uIgBN$ Temp:   97.9 F (36.6 C) 97.7 F (36.5 C)  TempSrc:   Axillary Oral  SpO2:   92% 92%  Weight:   (!) 138.9 kg (306 lb 3.5 oz)   Height:        Intake/Output Summary (Last 24 hours) at 07/30/16 1315 Last data filed at 07/30/16 0900  Gross per 24 hour  Intake              195 ml  Output             1825 ml  Net            -1630 ml     Wt Readings from Last 3  Encounters:  07/30/16 (!) 138.9 kg (306 lb 3.5 oz)  02/16/16 134.7 kg (297 lb)  10/14/15  127 kg (279 lb 14.4 oz)     Exam  General: Alert and oriented x 3, NAD  HEENT:    Neck:   Cardiovascular: S1 S2 clear, RRR  Respiratory: Clear to auscultation bilaterally, no wheezing, rales or rhonchi  Gastrointestinal: Soft, nontender, nondistended, + bowel sounds  Ext: no cyanosis clubbing or edema, right forefoot with wound VAC,  Neuro: no new deficits  Skin: No rashes  Psych: Normal affect and demeanor, alert and oriented x3    Data Reviewed:  I have personally reviewed following labs and imaging studies  Micro Results Recent Results (from the past 240 hour(s))  Culture, blood (routine x 2)     Status: None   Collection Time: 07/21/16 10:40 PM  Result Value Ref Range Status   Specimen Description BLOOD LEFT ARM  Final   Special Requests BOTTLES DRAWN AEROBIC AND ANAEROBIC 10ML  Final   Culture NO GROWTH 5 DAYS  Final   Report Status 07/26/2016 FINAL  Final  Culture, blood (routine x 2)     Status: None   Collection Time: 07/21/16 10:48 PM  Result Value Ref Range Status   Specimen Description BLOOD LEFT HAND  Final   Special Requests BOTTLES DRAWN AEROBIC AND ANAEROBIC 10ML  Final   Culture NO GROWTH 5 DAYS  Final   Report Status 07/26/2016 FINAL  Final  Culture, Urine     Status: None   Collection Time: 07/22/16 12:33 AM  Result Value Ref Range Status   Specimen Description URINE, CLEAN CATCH  Final   Special Requests NONE  Final   Culture NO GROWTH  Final   Report Status 07/23/2016 FINAL  Final  Surgical PCR screen     Status: None   Collection Time: 07/23/16  8:11 PM  Result Value Ref Range Status   MRSA, PCR NEGATIVE NEGATIVE Final   Staphylococcus aureus NEGATIVE NEGATIVE Final    Comment:        The Xpert SA Assay (FDA approved for NASAL specimens in patients over 75 years of age), is one component of a comprehensive surveillance program.  Test performance has been validated by [facility] for patients greater than or equal to 35 year  old. It is not intended to diagnose infection nor to guide or monitor treatment.     Radiology Reports Dg Chest 2 View  Result Date: 07/18/2016 CLINICAL DATA:  Left-sided chest pain for 2 weeks, initial encounter EXAM: CHEST  2 VIEW COMPARISON:  06/23/2016 FINDINGS: Cardiac shadow is at the upper limits of normal in size. Multiple calcified granulomas are noted consistent with prior granulomatous disease. No focal confluent infiltrate is seen. Mild bibasilar scarring is again noted. Degenerative change of the thoracic spine is seen. IMPRESSION: No acute abnormality noted. Electronically Signed   By: Inez Catalina M.D.   On: 07/18/2016 16:59   Ct Angio Chest Pe W And/or Wo Contrast  Result Date: 07/18/2016 CLINICAL DATA:  Chest discomfort for several days, worse last night and today. Burning and tightness. EXAM: CT ANGIOGRAPHY CHEST WITH CONTRAST TECHNIQUE: Multidetector CT imaging of the chest was performed using the standard protocol during bolus administration of intravenous contrast. Multiplanar CT image reconstructions and MIPs were obtained to evaluate the vascular anatomy. CONTRAST:  100 cc Isovue 370. COMPARISON:  Plain films of the chest today and 10/28/2013. FINDINGS: Evaluation for pulmonary embolus is limited by bolus timing. The  study was not repeated as the patient has renal insufficiency and mildly decreased GFR. No central or proximal segmental pulmonary embolus is identified. There is cardiomegaly. Extensive calcific coronary artery disease is seen. Multiple calcified mediastinal and hilar lymph nodes are noted. No axillary lymphadenopathy. No consolidative process is identified. There is some reticulation of lung parenchyma best seen in the right middle lobe and left lung base. No nodule is identified. Visualized upper abdomen demonstrates no focal abnormality. No lytic or sclerotic bony lesions identified. Review of the MIP images confirms the above findings. IMPRESSION: Although the  study is somewhat limited by bolus timing. No pulmonary embolus is seen. Calcified mediastinal and hilar lymph nodes could be due to old granulomatous disease or sarcoid. There is mild reticulation of pulmonary parenchyma best seen in the right middle lobe which may be due to early pulmonary changes of sarcoidosis. Cardiomegaly. Calcific aortic and coronary atherosclerosis. Electronically Signed   By: Inge Rise M.D.   On: 07/18/2016 20:02   Mr Foot Right Wo Contrast  Result Date: 07/23/2016 CLINICAL DATA:  Patient reports of bleeding wound of the right great toe and fluctuance on the bottom of the right foot. Question abscess or osteomyelitis. EXAM: MRI OF THE RIGHT FOREFOOT WITHOUT CONTRAST TECHNIQUE: Multiplanar, multisequence MR imaging was performed. No intravenous contrast was administered. COMPARISON:  MRI right foot 04/11/2016. Plain films the right foot 01/23/2016 and 07/16/2016. FINDINGS: There is edema and corresponding decreased T1 signal throughout the distal phalanx of the great toe consistent with osteomyelitis. Small focus of marrow edema is seen in the head of the proximal phalanx of the great toe. The patient is status post amputation of the second toe, third toe and distal third metatarsal as seen on prior exams. Moderate first MTP osteoarthritis is identified. Mixed signal intensity fluid most consistent with abscess is seen in soft tissues of the pad of the great toe deep to the distal phalanx. The collection measures approximately 0.8 cm long by 1 cm craniocaudal by 0.4 cm transverse. Diffuse subcutaneous edema is present about the foot. There appears to be bandaging at the head of the proximal phalanx of the great toe on the medial side. Intrinsic musculature of the foot is atrophied. IMPRESSION: Findings consistent with osteomyelitis throughout the distal phalanx of the great toe with an abscess in the plantar soft tissues at the level of the distal phalanx. Mild marrow edema in the  head of the proximal phalanx of the great toe may be reactive or could reflect small focus of osteomyelitis. Soft tissue edema throughout the dorsum of the foot consistent with cellulitis or dependent change. First MTP osteoarthritis. Status post amputation of the second and third toes and distal third metatarsal. Electronically Signed   By: Inge Rise M.D.   On: 07/23/2016 13:07   Dg Foot Complete Right  Result Date: 07/16/2016 CLINICAL DATA:  Osteomyelitis great toe EXAM: RIGHT FOOT COMPLETE - 3+ VIEW COMPARISON:  01/23/2016 FINDINGS: Soft tissue swelling and ulcer of the great toe. Negative for osteomyelitis. No fracture. Amputation of the second and third toes without osteomyelitis. Fourth and fifth toes appear intact Degenerative change in the midfoot.  Calcaneal spurring. IMPRESSION: Negative for osteomyelitis. Electronically Signed   By: Franchot Gallo M.D.   On: 07/16/2016 11:49    Lab Data:  CBC:  Recent Labs Lab 07/25/16 0253 07/26/16 0349 07/27/16 0244 07/28/16 0442 07/29/16 0316  WBC 9.4 9.9 10.6* 9.7 10.9*  HGB 9.8* 9.5* 9.6* 9.5* 9.2*  HCT 31.6* 30.2*  30.3* 30.2* 29.6*  MCV 88.3 85.8 85.8 86.8 86.5  PLT 218 243 254 283 572   Basic Metabolic Panel:  Recent Labs Lab 07/26/16 0349 07/27/16 0244 07/28/16 0442 07/29/16 0316 07/30/16 0411  NA 138 137 138 140 138  K 3.4* 3.7 3.6 3.6 3.6  CL 107 105 105 104 104  CO2 $Re'22 23 26 26 24  'KfW$ GLUCOSE 197* 171* 139* 109* 98  BUN $Re'12 12 13 8 10  'GTQ$ CREATININE 1.57* 1.66* 1.78* 1.61* 1.60*  CALCIUM 8.6* 8.7* 8.8* 8.7* 8.6*   GFR: Estimated Creatinine Clearance: 70.6 mL/min (by C-G formula based on SCr of 1.6 mg/dL). Liver Function Tests: No results for input(s): AST, ALT, ALKPHOS, BILITOT, PROT, ALBUMIN in the last 168 hours. No results for input(s): LIPASE, AMYLASE in the last 168 hours. No results for input(s): AMMONIA in the last 168 hours. Coagulation Profile: No results for input(s): INR, PROTIME in the last 168  hours. Cardiac Enzymes:  Recent Labs Lab 07/24/16 1311  CKTOTAL 338   BNP (last 3 results) No results for input(s): PROBNP in the last 8760 hours. HbA1C: No results for input(s): HGBA1C in the last 72 hours. CBG:  Recent Labs Lab 07/29/16 0602 07/29/16 1128 07/29/16 1749 07/29/16 2104 07/30/16 0610  GLUCAP 113* 128* 110* 120* 101*   Lipid Profile: No results for input(s): CHOL, HDL, LDLCALC, TRIG, CHOLHDL, LDLDIRECT in the last 72 hours. Thyroid Function Tests: No results for input(s): TSH, T4TOTAL, FREET4, T3FREE, THYROIDAB in the last 72 hours. Anemia Panel: No results for input(s): VITAMINB12, FOLATE, FERRITIN, TIBC, IRON, RETICCTPCT in the last 72 hours. Urine analysis:    Component Value Date/Time   COLORURINE YELLOW 07/22/2016 0033   APPEARANCEUR CLOUDY (A) 07/22/2016 0033   LABSPEC 1.021 07/22/2016 0033   PHURINE 5.0 07/22/2016 0033   GLUCOSEU NEGATIVE 07/22/2016 0033   HGBUR SMALL (A) 07/22/2016 0033   BILIRUBINUR NEGATIVE 07/22/2016 0033   KETONESUR NEGATIVE 07/22/2016 0033   PROTEINUR >300 (A) 07/22/2016 0033   UROBILINOGEN 0.2 11/01/2011 1410   NITRITE NEGATIVE 07/22/2016 0033   LEUKOCYTESUR NEGATIVE 07/22/2016 0033     Haislee Corso M.D. [facility] 07/30/2016, 1:15 PM  Pager: [phone] Between 7am to 7pm - call Pager - [phone]  After 7pm go to [email or web address] - password [email or web address]  Call night coverage person covering after 7pm

## 2016-07-31 LAB — GLUCOSE, CAPILLARY
Glucose-Capillary: 98 mg/dL (ref 65–99)
Glucose-Capillary: 158 mg/dL — ABNORMAL HIGH (ref 65–99)
Glucose-Capillary: 225 mg/dL — ABNORMAL HIGH (ref 65–99)
Glucose-Capillary: 182 mg/dL — ABNORMAL HIGH (ref 65–99)

## 2016-07-31 NOTE — Progress Notes (Signed)
Primary cardiologist: Dr. [name]  Seen for followup: CAD  Subjective:    No chest pain or shortness of breath.  Objective:   Temp:  [97.7 F (36.5 C)-98.7 F (37.1 C)] 98 F (36.7 C) (08/19 0449) Pulse Rate:  [70-79] 79 (08/19 1134) Resp:  [15-17] 17 (08/19 0449) BP: (130-184)/(62-91) 184/86 (08/19 1133) SpO2:  [94 %-96 %] 94 % (08/19 0449) Weight:  [306 lb (138.8 kg)] 306 lb (138.8 kg) (08/19 0449) Last BM Date: 07/29/16  Filed Weights   07/29/16 0554 07/30/16 0423 07/31/16 0449  Weight: (!) 313 lb 4.4 oz (142.1 kg) (!) 306 lb 3.5 oz (138.9 kg) (!) 306 lb (138.8 kg)    Intake/Output Summary (Last 24 hours) at 07/31/16 1201 Last data filed at 07/31/16 1131  Gross per 24 hour  Intake              590 ml  Output             3225 ml  Net            -2635 ml    Telemetry: Sinus rhythm.  Exam:  General: Obese male, appears comfortable.  Lungs: There are, nonlabored.  Cardiac: RRR without gallop.  Abdomen: NABS.  Extremities: Status post right TM amputation.  Lab Results:  Basic Metabolic Panel:  Recent Labs Lab 07/28/16 0442 07/29/16 0316 07/30/16 0411  NA 138 140 138  K 3.6 3.6 3.6  CL 105 104 104  CO2 _0 GLUCOSE 139* 109* 98  BUN _1 CREATININE 1.78* 1.61* 1.60*  CALCIUM 8.8* 8.7* 8.6*    CBC:  Recent Labs Lab 07/27/16 0244 07/28/16 0442 07/29/16 0316  WBC 10.6* 9.7 10.9*  HGB 9.6* 9.5* 9.2*  HCT 30.3* 30.2* 29.6*  MCV 85.8 86.8 86.5  PLT 254 283 317    Cardiac Enzymes:  Recent Labs Lab 07/24/16 1311  CKTOTAL 338   Cardiac catheterization with PCI 8/70/2017:  Mid RCA lesion, 50 %stenosed.  Mid RCA to Dist RCA lesion, 10 %stenosed.  RPDA lesion, 30 %stenosed.  Mid Cx lesion, 75 %stenosed.  3rd Mrg lesion, 30 %stenosed.  Ost 1st Diag to 1st Diag lesion, 40 %stenosed.  Dist LAD lesion, 10 %stenosed.  Ost LAD to Prox LAD lesion, 85 %stenosed.  A STENT SYNERGY DES 3.5X20 drug eluting stent was  successfully placed, and overlaps previously placed stent.  Prox LAD to Mid LAD lesion, 85 %stenosed.  Post intervention, there is a 0% residual stenosis.  A STENT SYNERGY DES [id number] drug eluting stent was successfully placed, and does not overlap previously placed stent.  2nd Mrg lesion, 95 %stenosed.  Post intervention, there is a 0% residual stenosis.    Successful stenting of proximal 85% LAD stenosis with a Synergy 3.5 x 20 drug-eluting stent from the LAD ostium to the proximal vessel overlapping previously placed stents. A 0% stenosis is noted post implantation. Final postdilatation diameter of 3.75 mm.  Successful stenting of the second obtuse marginal from 90% to 0% using a Synergy 2.75 x 16 mm DES deployed at 14 atm. Postdilatation was not performed.  Total contrast 95 cc  ECG: Tracing from 07/30/2016 shows sinus rhythm with old anterior infarct pattern.   Medications:   Scheduled Medications: . allopurinol  300 mg Oral Daily  . amLODipine  10 mg Oral Daily  . antiseptic oral rinse  7 mL Mouth Rinse BID  . aspirin  81 mg Oral Daily  . ceFTAROline (TEFLARO)  IV  600 mg Intravenous Q12H  . doxazosin  8 mg Oral Daily  . feeding supplement (GLUCERNA SHAKE)  237 mL Oral BID BM  . heparin  5,000 Units Subcutaneous Q8H  . hydrALAZINE  37.5 mg Oral BID  . insulin aspart  0-20 Units Subcutaneous TID WC  . insulin NPH Human  22 Units Subcutaneous QHS   And  . insulin NPH Human  20 Units Subcutaneous QAC breakfast  . isosorbide mononitrate  90 mg Oral Daily  . metoprolol succinate  75 mg Oral Daily  . pantoprazole  40 mg Oral Daily  . potassium chloride  20 mEq Oral Daily  . rosuvastatin  40 mg Oral q1800  . sodium chloride flush  3 mL Intravenous Q12H  . ticagrelor  90 mg Oral BID     Infusions: . sodium chloride 10 mL (07/24/16 1118)     PRN Medications:  sodium chloride, [DISCONTINUED] acetaminophen **OR** acetaminophen, acetaminophen, HYDROmorphone (DILAUDID)  injection, methocarbamol **OR** methocarbamol (ROBAXIN)  IV, metoCLOPramide **OR** metoCLOPramide (REGLAN) injection, nitroGLYCERIN, ondansetron (ZOFRAN) IV, ondansetron **OR** [DISCONTINUED] ondansetron (ZOFRAN) IV, oxyCODONE-acetaminophen, sodium chloride flush   Assessment:   1. Multivessel CAD status post DES to the LAD and OM 2 on August 17.  2. Chronic combined heart failure with LVEF 45-50%.  3. CKD stage 3, creatinine 1.6.   4. Osteomyelitis status post right TMA amputation with plan for 3 weeks of Teflaro per ID.   Plan/Discussion:    Review recent notes by Dr. Claiborne Billings and plan. Patient being considered for further inpatient rehabilitation versus SNF. Long-term antibiotics recommended per ID team as outlined above. From a cardiac perspective, he is now stable on medical therapy following recent intervention. Continue aspirin, Brilinta, Crestor, Toprol, XL Imdur, and hydralazine.   Satira Sark, M.D., F.A.C.C.

## 2016-07-31 NOTE — Progress Notes (Signed)
[facility] consult progress note                                                                               Patient Demographics  Joshua [name], is a 60 y.o. male, DOB - July 10, 1956, EUM:[id number]  Admit date - 07/18/2016   Admitting Physician [name], MD  Outpatient Primary MD for the patient is [name], MD  Outpatient specialists:   LOS - 12  days    Chief Complaint  Patient presents with  . Chest Pain       Brief summary   60year old male with CAD s/p LAD stent admitted with unstable angina by cardiology service, plan for PCI held off due to right foot infection for which he has now had TM amputation on 8/12 with Dr. Sharol Given. ID and [facility] were consulted as well. ID plans empiric Teflaro for 3 weeks, and cardiology will decide this coming week on PCI, likely stenting as patient has declined CABG   Assessment & Plan   Unstable angina pectoris (HCC) and CAD in native artery - stable, management per primary team,Underwent PCI to proximal LAD and LCx-OM2 yesterday     Chronic combined systolic and diastolic CHF (congestive heart failure) ([place]), CAD - Management per cardiology, primary team    acute on chronic kidney disease stage III  - Baseline creatinine around 1.5, on Lasix and IV antibiotics  -Creatinine improving/stable, Recheck BMET  Right foot wound -  - s/p TM amputation with Dr. Sharol Given on 8/12, wound vac in place. ID recommended 3 weeks Teflaro with weeks labs and ID follow up prior to completion of antibiotics.  - Patient will need a PICC line placed if he needs to be discharged with IV antibiotics to SNF. May be okay with peripheral line if discharged to CIR, awaiting bed and insurance approval    Type 1 diabetes mellitus with right diabetic foot ulcer ([place]) - CBGs stable, continue sliding scale insulin    Chronic osteomyelitis of toe of right foot ([place]) - s/p TM amputation - Antibiotics per ID  Code Status: full  DVT  Prophylaxis:   Family Communication: Discussed in detail with the patient, all imaging results, lab results explained to the patient   Disposition Plan: Per cardiology  Time Spent in minutes   15 minutes  Procedures:  Cardiac cath 8/7  TM amputation right foot 8/12   Antimicrobials:  Dapto 8/12-8/13  Rocephin 8/12 - 8/13  Teflaro 8/13 for planned 3 weeks  Medications  Scheduled Meds: . allopurinol  300 mg Oral Daily  . amLODipine  10 mg Oral Daily  . antiseptic oral rinse  7 mL Mouth Rinse BID  . aspirin  81 mg Oral Daily  . ceFTAROline (TEFLARO) IV  600 mg Intravenous Q12H  . doxazosin  8 mg Oral Daily  . feeding supplement (GLUCERNA SHAKE)  237 mL Oral BID BM  . heparin  5,000 Units Subcutaneous Q8H  . hydrALAZINE  37.5 mg Oral BID  . insulin aspart  0-20 Units Subcutaneous TID WC  . insulin NPH Human  22 Units Subcutaneous QHS   And  . insulin  NPH Human  20 Units Subcutaneous QAC breakfast  . isosorbide mononitrate  90 mg Oral Daily  . metoprolol succinate  75 mg Oral Daily  . pantoprazole  40 mg Oral Daily  . potassium chloride  20 mEq Oral Daily  . rosuvastatin  40 mg Oral q1800  . sodium chloride flush  3 mL Intravenous Q12H  . ticagrelor  90 mg Oral BID   Continuous Infusions: . sodium chloride 10 mL (07/24/16 1118)   PRN Meds:.sodium chloride, [DISCONTINUED] acetaminophen **OR** acetaminophen, acetaminophen, HYDROmorphone (DILAUDID) injection, methocarbamol **OR** methocarbamol (ROBAXIN)  IV, metoCLOPramide **OR** metoCLOPramide (REGLAN) injection, nitroGLYCERIN, ondansetron (ZOFRAN) IV, ondansetron **OR** [DISCONTINUED] ondansetron (ZOFRAN) IV, oxyCODONE-acetaminophen, sodium chloride flush   Antibiotics   Anti-infectives    Start     Dose/Rate Route Frequency Ordered Stop   07/25/16 1000  ceftaroline (TEFLARO) 600 mg in sodium chloride 0.9 % 250 mL IVPB     600 mg 250 mL/hr over 60 Minutes Intravenous Every 12 hours 07/25/16 0913     07/24/16 1400   DAPTOmycin (CUBICIN) 1,000 mg in sodium chloride 0.9 % IVPB  Status:  Discontinued     1,000 mg 240 mL/hr over 30 Minutes Intravenous Every 24 hours 07/24/16 1232 07/25/16 0913   07/24/16 1230  cefTRIAXone (ROCEPHIN) 2 g in dextrose 5 % 50 mL IVPB  Status:  Discontinued     2 g 100 mL/hr over 30 Minutes Intravenous Every 24 hours 07/24/16 1219 07/25/16 0913   07/24/16 0900  ceFAZolin (ANCEF) 3 g in dextrose 5 % 50 mL IVPB  Status:  Discontinued    Comments:  CAN YOU PLEASE NOT GIVE THIS PRE-OP BUT PERIOPERATIVELY SO WE CAN GET SOME INTRA-OPERATIVE CULTURES? KEES VAN DAM   3 g 130 mL/hr over 30 Minutes Intravenous To [facility] 07/23/16 1918 07/24/16 0826   07/24/16 0849  ceFAZolin (ANCEF) 1 GM/50ML IVPB    Comments:  Marinda Elk   : cabinet override      07/24/16 0849 07/24/16 2059   07/24/16 0849  ceFAZolin (ANCEF) 2-4 GM/100ML-% IVPB    Comments:  Marinda Elk   : cabinet override      07/24/16 0849 07/24/16 2059   07/24/16 0600  ceFAZolin (ANCEF) 3 g in dextrose 5 % 50 mL IVPB  Status:  Discontinued     3 g 130 mL/hr over 30 Minutes Intravenous On call to O.R. 07/23/16 1749 07/23/16 1918        Subjective:   Joshua [name] was seen and examined today.  No complaints per patient. Patient denies dizziness, chest pain, shortness of breath, abdominal pain, N/V/D/C, new weakness, numbess, tingling.   Objective:   Vitals:   07/30/16 1712 07/30/16 2149 07/31/16 0036 07/31/16 0449  BP: (!) 146/91 130/69  (!) 147/62  Pulse: 75 74 78 70  Resp: $Remo'17 16 16 17  'ptwWS$ Temp: 98.7 F (37.1 C) 98.3 F (36.8 C)  98 F (36.7 C)  TempSrc: Oral Oral  Axillary  SpO2: 95% 96% 96% 94%  Weight:    (!) 138.8 kg (306 lb)  Height:        Intake/Output Summary (Last 24 hours) at 07/31/16 1118 Last data filed at 07/31/16 0015  Gross per 24 hour  Intake              840 ml  Output             2225 ml  Net            -1385 ml  Wt Readings from Last 3 Encounters:  07/31/16 (!) 138.8  kg (306 lb)  02/16/16 134.7 kg (297 lb)  10/14/15 127 kg (279 lb 14.4 oz)     Exam  General: Alert and oriented x 3, NAD  HEENT:    Neck:   Cardiovascular: S1 S2 clear, RRR  Respiratory: CTAB  Gastrointestinal: Soft, NT, NBS  Ext: no cyanosis clubbing or edema, right forefoot with wound VAC,  Neuro: no new deficits  Skin: No rashes  Psych: Normal affect and demeanor, alert and oriented x3    Data Reviewed:  I have personally reviewed following labs and imaging studies  Micro Results Recent Results (from the past 240 hour(s))  Culture, blood (routine x 2)     Status: None   Collection Time: 07/21/16 10:40 PM  Result Value Ref Range Status   Specimen Description BLOOD LEFT ARM  Final   Special Requests BOTTLES DRAWN AEROBIC AND ANAEROBIC 10ML  Final   Culture NO GROWTH 5 DAYS  Final   Report Status 07/26/2016 FINAL  Final  Culture, blood (routine x 2)     Status: None   Collection Time: 07/21/16 10:48 PM  Result Value Ref Range Status   Specimen Description BLOOD LEFT HAND  Final   Special Requests BOTTLES DRAWN AEROBIC AND ANAEROBIC 10ML  Final   Culture NO GROWTH 5 DAYS  Final   Report Status 07/26/2016 FINAL  Final  Culture, Urine     Status: None   Collection Time: 07/22/16 12:33 AM  Result Value Ref Range Status   Specimen Description URINE, CLEAN CATCH  Final   Special Requests NONE  Final   Culture NO GROWTH  Final   Report Status 07/23/2016 FINAL  Final  Surgical PCR screen     Status: None   Collection Time: 07/23/16  8:11 PM  Result Value Ref Range Status   MRSA, PCR NEGATIVE NEGATIVE Final   Staphylococcus aureus NEGATIVE NEGATIVE Final    Comment:        The Xpert SA Assay (FDA approved for NASAL specimens in patients over 60 years of age), is one component of a comprehensive surveillance program.  Test performance has been validated by [facility] for patients greater than or equal to 49 year old. It is not intended to diagnose infection  nor to guide or monitor treatment.     Radiology Reports Dg Chest 2 View  Result Date: 07/18/2016 CLINICAL DATA:  Left-sided chest pain for 2 weeks, initial encounter EXAM: CHEST  2 VIEW COMPARISON:  06/23/2016 FINDINGS: Cardiac shadow is at the upper limits of normal in size. Multiple calcified granulomas are noted consistent with prior granulomatous disease. No focal confluent infiltrate is seen. Mild bibasilar scarring is again noted. Degenerative change of the thoracic spine is seen. IMPRESSION: No acute abnormality noted. Electronically Signed   By: Inez Catalina M.D.   On: 07/18/2016 16:59   Ct Angio Chest Pe W And/or Wo Contrast  Result Date: 07/18/2016 CLINICAL DATA:  Chest discomfort for several days, worse last night and today. Burning and tightness. EXAM: CT ANGIOGRAPHY CHEST WITH CONTRAST TECHNIQUE: Multidetector CT imaging of the chest was performed using the standard protocol during bolus administration of intravenous contrast. Multiplanar CT image reconstructions and MIPs were obtained to evaluate the vascular anatomy. CONTRAST:  100 cc Isovue 370. COMPARISON:  Plain films of the chest today and 10/28/2013. FINDINGS: Evaluation for pulmonary embolus is limited by bolus timing. The study was not repeated as the patient has  renal insufficiency and mildly decreased GFR. No central or proximal segmental pulmonary embolus is identified. There is cardiomegaly. Extensive calcific coronary artery disease is seen. Multiple calcified mediastinal and hilar lymph nodes are noted. No axillary lymphadenopathy. No consolidative process is identified. There is some reticulation of lung parenchyma best seen in the right middle lobe and left lung base. No nodule is identified. Visualized upper abdomen demonstrates no focal abnormality. No lytic or sclerotic bony lesions identified. Review of the MIP images confirms the above findings. IMPRESSION: Although the study is somewhat limited by bolus timing. No  pulmonary embolus is seen. Calcified mediastinal and hilar lymph nodes could be due to old granulomatous disease or sarcoid. There is mild reticulation of pulmonary parenchyma best seen in the right middle lobe which may be due to early pulmonary changes of sarcoidosis. Cardiomegaly. Calcific aortic and coronary atherosclerosis. Electronically Signed   By: Inge Rise M.D.   On: 07/18/2016 20:02   Mr Foot Right Wo Contrast  Result Date: 07/23/2016 CLINICAL DATA:  Patient reports of bleeding wound of the right great toe and fluctuance on the bottom of the right foot. Question abscess or osteomyelitis. EXAM: MRI OF THE RIGHT FOREFOOT WITHOUT CONTRAST TECHNIQUE: Multiplanar, multisequence MR imaging was performed. No intravenous contrast was administered. COMPARISON:  MRI right foot 04/11/2016. Plain films the right foot 01/23/2016 and 07/16/2016. FINDINGS: There is edema and corresponding decreased T1 signal throughout the distal phalanx of the great toe consistent with osteomyelitis. Small focus of marrow edema is seen in the head of the proximal phalanx of the great toe. The patient is status post amputation of the second toe, third toe and distal third metatarsal as seen on prior exams. Moderate first MTP osteoarthritis is identified. Mixed signal intensity fluid most consistent with abscess is seen in soft tissues of the pad of the great toe deep to the distal phalanx. The collection measures approximately 0.8 cm long by 1 cm craniocaudal by 0.4 cm transverse. Diffuse subcutaneous edema is present about the foot. There appears to be bandaging at the head of the proximal phalanx of the great toe on the medial side. Intrinsic musculature of the foot is atrophied. IMPRESSION: Findings consistent with osteomyelitis throughout the distal phalanx of the great toe with an abscess in the plantar soft tissues at the level of the distal phalanx. Mild marrow edema in the head of the proximal phalanx of the great toe  may be reactive or could reflect small focus of osteomyelitis. Soft tissue edema throughout the dorsum of the foot consistent with cellulitis or dependent change. First MTP osteoarthritis. Status post amputation of the second and third toes and distal third metatarsal. Electronically Signed   By: Inge Rise M.D.   On: 07/23/2016 13:07   Dg Foot Complete Right  Result Date: 07/16/2016 CLINICAL DATA:  Osteomyelitis great toe EXAM: RIGHT FOOT COMPLETE - 3+ VIEW COMPARISON:  01/23/2016 FINDINGS: Soft tissue swelling and ulcer of the great toe. Negative for osteomyelitis. No fracture. Amputation of the second and third toes without osteomyelitis. Fourth and fifth toes appear intact Degenerative change in the midfoot.  Calcaneal spurring. IMPRESSION: Negative for osteomyelitis. Electronically Signed   By: Franchot Gallo M.D.   On: 07/16/2016 11:49    Lab Data:  CBC:  Recent Labs Lab 07/25/16 0253 07/26/16 0349 07/27/16 0244 07/28/16 0442 07/29/16 0316  WBC 9.4 9.9 10.6* 9.7 10.9*  HGB 9.8* 9.5* 9.6* 9.5* 9.2*  HCT 31.6* 30.2* 30.3* 30.2* 29.6*  MCV 88.3 85.8 85.8  86.8 86.5  PLT 218 243 254 283 098   Basic Metabolic Panel:  Recent Labs Lab 07/26/16 0349 07/27/16 0244 07/28/16 0442 07/29/16 0316 07/30/16 0411  NA 138 137 138 140 138  K 3.4* 3.7 3.6 3.6 3.6  CL 107 105 105 104 104  CO2 $Re'22 23 26 26 24  'woj$ GLUCOSE 197* 171* 139* 109* 98  BUN $Re'12 12 13 8 10  'kbz$ CREATININE 1.57* 1.66* 1.78* 1.61* 1.60*  CALCIUM 8.6* 8.7* 8.8* 8.7* 8.6*   GFR: Estimated Creatinine Clearance: 70.5 mL/min (by C-G formula based on SCr of 1.6 mg/dL). Liver Function Tests: No results for input(s): AST, ALT, ALKPHOS, BILITOT, PROT, ALBUMIN in the last 168 hours. No results for input(s): LIPASE, AMYLASE in the last 168 hours. No results for input(s): AMMONIA in the last 168 hours. Coagulation Profile: No results for input(s): INR, PROTIME in the last 168 hours. Cardiac Enzymes:  Recent Labs Lab  07/24/16 1311  CKTOTAL 338   BNP (last 3 results) No results for input(s): PROBNP in the last 8760 hours. HbA1C: No results for input(s): HGBA1C in the last 72 hours. CBG:  Recent Labs Lab 07/29/16 2104 07/30/16 0610 07/30/16 1738 07/30/16 2101 07/31/16 0624  GLUCAP 120* 101* 126* 100* 98   Lipid Profile: No results for input(s): CHOL, HDL, LDLCALC, TRIG, CHOLHDL, LDLDIRECT in the last 72 hours. Thyroid Function Tests: No results for input(s): TSH, T4TOTAL, FREET4, T3FREE, THYROIDAB in the last 72 hours. Anemia Panel: No results for input(s): VITAMINB12, FOLATE, FERRITIN, TIBC, IRON, RETICCTPCT in the last 72 hours. Urine analysis:    Component Value Date/Time   COLORURINE YELLOW 07/22/2016 0033   APPEARANCEUR CLOUDY (A) 07/22/2016 0033   LABSPEC 1.021 07/22/2016 0033   PHURINE 5.0 07/22/2016 0033   GLUCOSEU NEGATIVE 07/22/2016 0033   HGBUR SMALL (A) 07/22/2016 0033   BILIRUBINUR NEGATIVE 07/22/2016 0033   KETONESUR NEGATIVE 07/22/2016 0033   PROTEINUR >300 (A) 07/22/2016 0033   UROBILINOGEN 0.2 11/01/2011 1410   NITRITE NEGATIVE 07/22/2016 0033   LEUKOCYTESUR NEGATIVE 07/22/2016 0033     Tephanie Escorcia M.D. [facility] 07/31/2016, 11:18 AM  Pager: [phone] Between 7am to 7pm - call Pager - [phone]  After 7pm go to [email or web address] - password [email or web address]  Call night coverage person covering after 7pm

## 2016-08-01 LAB — GLUCOSE, CAPILLARY
Glucose-Capillary: 184 mg/dL — ABNORMAL HIGH (ref 65–99)
Glucose-Capillary: 189 mg/dL — ABNORMAL HIGH (ref 65–99)
Glucose-Capillary: 300 mg/dL — ABNORMAL HIGH (ref 65–99)
Glucose-Capillary: 231 mg/dL — ABNORMAL HIGH (ref 65–99)

## 2016-08-01 LAB — CBC
HCT: 28.3 % — ABNORMAL LOW (ref 39.0–52.0)
Hemoglobin: 8.8 g/dL — ABNORMAL LOW (ref 13.0–17.0)
MCH: 27.1 pg (ref 26.0–34.0)
MCHC: 31.1 g/dL (ref 30.0–36.0)
MCV: 87.1 fL (ref 78.0–100.0)
Platelets: 283 10*3/uL (ref 150–400)
RBC: 3.25 MIL/uL — ABNORMAL LOW (ref 4.22–5.81)
RDW: 14.3 % (ref 11.5–15.5)
WBC: 9.4 10*3/uL (ref 4.0–10.5)

## 2016-08-01 LAB — BASIC METABOLIC PANEL
Anion gap: 10 (ref 5–15)
BUN: 14 mg/dL (ref 6–20)
CO2: 24 mmol/L (ref 22–32)
Calcium: 8.4 mg/dL — ABNORMAL LOW (ref 8.9–10.3)
Chloride: 103 mmol/L (ref 101–111)
Creatinine, Ser: 1.7 mg/dL — ABNORMAL HIGH (ref 0.61–1.24)
GFR calc Af Amer: 50 mL/min — ABNORMAL LOW (ref >60)
GFR calc non Af Amer: 43 mL/min — ABNORMAL LOW (ref >60)
Glucose, Bld: 202 mg/dL — ABNORMAL HIGH (ref 65–99)
Potassium: 3.7 mmol/L (ref 3.5–5.1)
Sodium: 137 mmol/L (ref 135–145)

## 2016-08-01 MED ORDER — SODIUM CHLORIDE 0.9 % IV SOLN: 600.0000 mg | Freq: Two times a day (BID) | INTRAVENOUS | Status: DC

## 2016-08-01 NOTE — Progress Notes (Addendum)
[facility] consult progress note                                                                               Patient Demographics  Joshua [name], is a 60 y.o. male, DOB - Jul 01, 1956, ZWC:[id number]  Admit date - 07/18/2016   Admitting Physician [name], MD  Outpatient Primary MD for the patient is [name], MD  Outpatient specialists:   LOS - 13  days    Chief Complaint  Patient presents with  . Chest Pain       Brief summary   60year old male with CAD s/p LAD stent admitted with unstable angina by cardiology service, plan for PCI held off due to right foot infection for which he has now had TM amputation on 8/12 with Dr. Sharol Given. ID and TRH were consulted as well. ID plans empiric Teflaro for 3 weeks, and cardiology will decide this coming week on PCI, likely stenting as patient has declined CABG   Assessment & Plan   Unstable angina pectoris ([place]) and CAD in native artery Status post PCI - stable, management per primary team,Underwent PCI to proximal LAD and LCx-OM2 yesterday     Chronic combined systolic and diastolic CHF (congestive heart failure) ([place]), CAD - Management per cardiology, primary team    acute on chronic kidney disease stage III  - Baseline creatinine around 1.5, on Lasix and IV antibiotics  - CreatinineSlightly trending up to 1.7 today  Right foot wound -  - s/p TM amputation with Dr. Sharol Given on 8/12, wound vac in place. ID recommended total 3 weeks Teflaro with weekly labs, CBC, BMET  and ID follow up prior to completion of antibiotics.  - Patient will need a PICC line placed if he needs to be discharged with IV antibiotics to SNF. May be okay with peripheral line if discharged to [facility], awaiting bed and insurance approval - Please note Teflaro was started on 8/13, stop date on September 3rd. He has a follow-up appointment with Dr. Drucilla Schmidt ID on 8/31 at 2 PM.    Type 1 diabetes mellitus with right diabetic foot ulcer  ([place]) - CBGs stable, continue sliding scale insulin    Chronic osteomyelitis of toe of right foot ([place]) - s/p TM amputation - Antibiotics per ID, please see above  Code Status: full  DVT Prophylaxis:   Family Communication: Discussed in detail with the patient, all imaging results, lab results explained to the patient   Disposition Plan: Per cardiology. Patient is stable from medical standpoint to be discharged. He is currently awaiting a bed at CIR, likely in am. I will sign off. Please call me if you have any questions. I have discussed the above plan with Dr. Marlou Porch, on call cardiology.  Time Spent in minutes   15 minutes  Procedures:  Cardiac cath 8/7  TM amputation right foot 8/12   Antimicrobials:  Dapto 8/12-8/13  Rocephin 8/12 - 8/13  Teflaro 8/13 for planned 3 weeks  Medications  Scheduled Meds: . allopurinol  300 mg Oral Daily  . amLODipine  10 mg Oral Daily  . antiseptic oral rinse  7  mL Mouth Rinse BID  . aspirin  81 mg Oral Daily  . ceFTAROline (TEFLARO) IV  600 mg Intravenous Q12H  . doxazosin  8 mg Oral Daily  . feeding supplement (GLUCERNA SHAKE)  237 mL Oral BID BM  . heparin  5,000 Units Subcutaneous Q8H  . hydrALAZINE  37.5 mg Oral BID  . insulin aspart  0-20 Units Subcutaneous TID WC  . insulin NPH Human  22 Units Subcutaneous QHS   And  . insulin NPH Human  20 Units Subcutaneous QAC breakfast  . isosorbide mononitrate  90 mg Oral Daily  . metoprolol succinate  75 mg Oral Daily  . pantoprazole  40 mg Oral Daily  . potassium chloride  20 mEq Oral Daily  . rosuvastatin  40 mg Oral q1800  . sodium chloride flush  3 mL Intravenous Q12H  . ticagrelor  90 mg Oral BID   Continuous Infusions: . sodium chloride 10 mL/hr at 07/31/16 1239   PRN Meds:.sodium chloride, [DISCONTINUED] acetaminophen **OR** acetaminophen, acetaminophen, HYDROmorphone (DILAUDID) injection, methocarbamol **OR** methocarbamol (ROBAXIN)  IV, metoCLOPramide **OR**  metoCLOPramide (REGLAN) injection, nitroGLYCERIN, ondansetron (ZOFRAN) IV, ondansetron **OR** [DISCONTINUED] ondansetron (ZOFRAN) IV, oxyCODONE-acetaminophen, sodium chloride flush   Antibiotics   Anti-infectives    Start     Dose/Rate Route Frequency Ordered Stop   07/25/16 1000  ceftaroline (TEFLARO) 600 mg in sodium chloride 0.9 % 250 mL IVPB     600 mg 250 mL/hr over 60 Minutes Intravenous Every 12 hours 07/25/16 0913     07/24/16 1400  DAPTOmycin (CUBICIN) 1,000 mg in sodium chloride 0.9 % IVPB  Status:  Discontinued     1,000 mg 240 mL/hr over 30 Minutes Intravenous Every 24 hours 07/24/16 1232 07/25/16 0913   07/24/16 1230  cefTRIAXone (ROCEPHIN) 2 g in dextrose 5 % 50 mL IVPB  Status:  Discontinued     2 g 100 mL/hr over 30 Minutes Intravenous Every 24 hours 07/24/16 1219 07/25/16 0913   07/24/16 0900  ceFAZolin (ANCEF) 3 g in dextrose 5 % 50 mL IVPB  Status:  Discontinued    Comments:  CAN YOU PLEASE NOT GIVE THIS PRE-OP BUT PERIOPERATIVELY SO WE CAN GET SOME INTRA-OPERATIVE CULTURES? KEES VAN DAM   3 g 130 mL/hr over 30 Minutes Intravenous To [facility] 07/23/16 1918 07/24/16 0826   07/24/16 0849  ceFAZolin (ANCEF) 1 GM/50ML IVPB    Comments:  Marinda Elk   : cabinet override      07/24/16 0849 07/24/16 2059   07/24/16 0849  ceFAZolin (ANCEF) 2-4 GM/100ML-% IVPB    Comments:  Marinda Elk   : cabinet override      07/24/16 0849 07/24/16 2059   07/24/16 0600  ceFAZolin (ANCEF) 3 g in dextrose 5 % 50 mL IVPB  Status:  Discontinued     3 g 130 mL/hr over 30 Minutes Intravenous On call to O.R. 07/23/16 1749 07/23/16 1918        Subjective:   Joshua [name] was seen and examined today.  No complaints per patient. Awaiting rehab. Patient denies dizziness, chest pain, shortness of breath, abdominal pain, N/V/D/C, new weakness, numbess, tingling.   Objective:   Vitals:   07/31/16 1134 07/31/16 1324 07/31/16 1955 08/01/16 0429  BP:  (!) 146/64 120/64 136/62    Pulse: 79 90 71 76  Resp:  $Remo'17 17 18  'WZeyh$ Temp:  98.4 F (36.9 C) 99.2 F (37.3 C) 97.5 F (36.4 C)  TempSrc:  Oral Oral Axillary  SpO2:  98%  95% 94%  Weight:    (!) 138.7 kg (305 lb 12.5 oz)  Height:        Intake/Output Summary (Last 24 hours) at 08/01/16 5456 Last data filed at 08/01/16 0827  Gross per 24 hour  Intake                0 ml  Output             3525 ml  Net            -3525 ml     Wt Readings from Last 3 Encounters:  08/01/16 (!) 138.7 kg (305 lb 12.5 oz)  02/16/16 134.7 kg (297 lb)  10/14/15 127 kg (279 lb 14.4 oz)     Exam  General: Alert and oriented x 3, NAD  HEENT:    Neck:   Cardiovascular: S1 S2 clear, RRR  Respiratory: CTAB  Gastrointestinal: Soft, NT, NBS  Ext: no cyanosis clubbing or edema, right forefoot with wound VAC,  Neuro: no new deficits  Skin: No rashes  Psych: Normal affect and demeanor, alert and oriented x3    Data Reviewed:  I have personally reviewed following labs and imaging studies  Micro Results Recent Results (from the past 240 hour(s))  Surgical PCR screen     Status: None   Collection Time: 07/23/16  8:11 PM  Result Value Ref Range Status   MRSA, PCR NEGATIVE NEGATIVE Final   Staphylococcus aureus NEGATIVE NEGATIVE Final    Comment:        The Xpert SA Assay (FDA approved for NASAL specimens in patients over 49 years of age), is one component of a comprehensive surveillance program.  Test performance has been validated by [facility] for patients greater than or equal to 20 year old. It is not intended to diagnose infection nor to guide or monitor treatment.     Radiology Reports Dg Chest 2 View  Result Date: 07/18/2016 CLINICAL DATA:  Left-sided chest pain for 2 weeks, initial encounter EXAM: CHEST  2 VIEW COMPARISON:  06/23/2016 FINDINGS: Cardiac shadow is at the upper limits of normal in size. Multiple calcified granulomas are noted consistent with prior granulomatous disease. No focal confluent  infiltrate is seen. Mild bibasilar scarring is again noted. Degenerative change of the thoracic spine is seen. IMPRESSION: No acute abnormality noted. Electronically Signed   By: Inez Catalina M.D.   On: 07/18/2016 16:59   Ct Angio Chest Pe W And/or Wo Contrast  Result Date: 07/18/2016 CLINICAL DATA:  Chest discomfort for several days, worse last night and today. Burning and tightness. EXAM: CT ANGIOGRAPHY CHEST WITH CONTRAST TECHNIQUE: Multidetector CT imaging of the chest was performed using the standard protocol during bolus administration of intravenous contrast. Multiplanar CT image reconstructions and MIPs were obtained to evaluate the vascular anatomy. CONTRAST:  100 cc Isovue 370. COMPARISON:  Plain films of the chest today and 10/28/2013. FINDINGS: Evaluation for pulmonary embolus is limited by bolus timing. The study was not repeated as the patient has renal insufficiency and mildly decreased GFR. No central or proximal segmental pulmonary embolus is identified. There is cardiomegaly. Extensive calcific coronary artery disease is seen. Multiple calcified mediastinal and hilar lymph nodes are noted. No axillary lymphadenopathy. No consolidative process is identified. There is some reticulation of lung parenchyma best seen in the right middle lobe and left lung base. No nodule is identified. Visualized upper abdomen demonstrates no focal abnormality. No lytic or sclerotic bony lesions identified. Review of the MIP  images confirms the above findings. IMPRESSION: Although the study is somewhat limited by bolus timing. No pulmonary embolus is seen. Calcified mediastinal and hilar lymph nodes could be due to old granulomatous disease or sarcoid. There is mild reticulation of pulmonary parenchyma best seen in the right middle lobe which may be due to early pulmonary changes of sarcoidosis. Cardiomegaly. Calcific aortic and coronary atherosclerosis. Electronically Signed   By: Inge Rise M.D.   On:  07/18/2016 20:02   Mr Foot Right Wo Contrast  Result Date: 07/23/2016 CLINICAL DATA:  Patient reports of bleeding wound of the right great toe and fluctuance on the bottom of the right foot. Question abscess or osteomyelitis. EXAM: MRI OF THE RIGHT FOREFOOT WITHOUT CONTRAST TECHNIQUE: Multiplanar, multisequence MR imaging was performed. No intravenous contrast was administered. COMPARISON:  MRI right foot 04/11/2016. Plain films the right foot 01/23/2016 and 07/16/2016. FINDINGS: There is edema and corresponding decreased T1 signal throughout the distal phalanx of the great toe consistent with osteomyelitis. Small focus of marrow edema is seen in the head of the proximal phalanx of the great toe. The patient is status post amputation of the second toe, third toe and distal third metatarsal as seen on prior exams. Moderate first MTP osteoarthritis is identified. Mixed signal intensity fluid most consistent with abscess is seen in soft tissues of the pad of the great toe deep to the distal phalanx. The collection measures approximately 0.8 cm long by 1 cm craniocaudal by 0.4 cm transverse. Diffuse subcutaneous edema is present about the foot. There appears to be bandaging at the head of the proximal phalanx of the great toe on the medial side. Intrinsic musculature of the foot is atrophied. IMPRESSION: Findings consistent with osteomyelitis throughout the distal phalanx of the great toe with an abscess in the plantar soft tissues at the level of the distal phalanx. Mild marrow edema in the head of the proximal phalanx of the great toe may be reactive or could reflect small focus of osteomyelitis. Soft tissue edema throughout the dorsum of the foot consistent with cellulitis or dependent change. First MTP osteoarthritis. Status post amputation of the second and third toes and distal third metatarsal. Electronically Signed   By: Inge Rise M.D.   On: 07/23/2016 13:07   Dg Foot Complete Right  Result Date:  07/16/2016 CLINICAL DATA:  Osteomyelitis great toe EXAM: RIGHT FOOT COMPLETE - 3+ VIEW COMPARISON:  01/23/2016 FINDINGS: Soft tissue swelling and ulcer of the great toe. Negative for osteomyelitis. No fracture. Amputation of the second and third toes without osteomyelitis. Fourth and fifth toes appear intact Degenerative change in the midfoot.  Calcaneal spurring. IMPRESSION: Negative for osteomyelitis. Electronically Signed   By: Franchot Gallo M.D.   On: 07/16/2016 11:49    Lab Data:  CBC:  Recent Labs Lab 07/26/16 0349 07/27/16 0244 07/28/16 0442 07/29/16 0316 08/01/16 0301  WBC 9.9 10.6* 9.7 10.9* 9.4  HGB 9.5* 9.6* 9.5* 9.2* 8.8*  HCT 30.2* 30.3* 30.2* 29.6* 28.3*  MCV 85.8 85.8 86.8 86.5 87.1  PLT 243 254 283 317 785   Basic Metabolic Panel:  Recent Labs Lab 07/27/16 0244 07/28/16 0442 07/29/16 0316 07/30/16 0411 08/01/16 0301  NA 137 138 140 138 137  K 3.7 3.6 3.6 3.6 3.7  CL 105 105 104 104 103  CO2 $Re'23 26 26 24 24  'RrF$ GLUCOSE 171* 139* 109* 98 202*  BUN $Re'12 13 8 10 14  'maG$ CREATININE 1.66* 1.78* 1.61* 1.60* 1.70*  CALCIUM 8.7* 8.8* 8.7*  8.6* 8.4*   GFR: Estimated Creatinine Clearance: 66.4 mL/min (by C-G formula based on SCr of 1.7 mg/dL). Liver Function Tests: No results for input(s): AST, ALT, ALKPHOS, BILITOT, PROT, ALBUMIN in the last 168 hours. No results for input(s): LIPASE, AMYLASE in the last 168 hours. No results for input(s): AMMONIA in the last 168 hours. Coagulation Profile: No results for input(s): INR, PROTIME in the last 168 hours. Cardiac Enzymes: No results for input(s): CKTOTAL, CKMB, CKMBINDEX, TROPONINI in the last 168 hours. BNP (last 3 results) No results for input(s): PROBNP in the last 8760 hours. HbA1C: No results for input(s): HGBA1C in the last 72 hours. CBG:  Recent Labs Lab 07/31/16 0624 07/31/16 1201 07/31/16 1626 07/31/16 2131 08/01/16 0641  GLUCAP 98 158* 225* 182* 184*   Lipid Profile: No results for input(s): CHOL, HDL,  LDLCALC, TRIG, CHOLHDL, LDLDIRECT in the last 72 hours. Thyroid Function Tests: No results for input(s): TSH, T4TOTAL, FREET4, T3FREE, THYROIDAB in the last 72 hours. Anemia Panel: No results for input(s): VITAMINB12, FOLATE, FERRITIN, TIBC, IRON, RETICCTPCT in the last 72 hours. Urine analysis:    Component Value Date/Time   COLORURINE YELLOW 07/22/2016 0033   APPEARANCEUR CLOUDY (A) 07/22/2016 0033   LABSPEC 1.021 07/22/2016 0033   PHURINE 5.0 07/22/2016 0033   GLUCOSEU NEGATIVE 07/22/2016 0033   HGBUR SMALL (A) 07/22/2016 0033   BILIRUBINUR NEGATIVE 07/22/2016 0033   KETONESUR NEGATIVE 07/22/2016 0033   PROTEINUR >300 (A) 07/22/2016 0033   UROBILINOGEN 0.2 11/01/2011 1410   NITRITE NEGATIVE 07/22/2016 0033   LEUKOCYTESUR NEGATIVE 07/22/2016 0033     Venise Ellingwood M.D. [facility] 08/01/2016, 9:52 AM  Pager: [phone] Between 7am to 7pm - call Pager - [phone]  After 7pm go to [email or web address] - password [email or web address]  Call night coverage person covering after 7pm

## 2016-08-01 NOTE — Progress Notes (Signed)
Placed patient on CPAP for the night.  

## 2016-08-01 NOTE — Progress Notes (Signed)
Primary cardiologist: Dr. [name]  Seen for followup: CAD  Subjective:    Eating lunch. No angina symptoms.  Objective:   Temp:  [97.5 F (36.4 C)-99.2 F (37.3 C)] 97.5 F (36.4 C) (08/20 0429) Pulse Rate:  [71-90] 71 (08/20 1048) Resp:  [17-18] 18 (08/20 0429) BP: (120-166)/(62-78) 166/78 (08/20 1048) SpO2:  [94 %-98 %] 94 % (08/20 0429) Weight:  [305 lb 12.5 oz (138.7 kg)] 305 lb 12.5 oz (138.7 kg) (08/20 0429) Last BM Date: 07/29/16  Filed Weights   07/30/16 0423 07/31/16 0449 08/01/16 0429  Weight: (!) 306 lb 3.5 oz (138.9 kg) (!) 306 lb (138.8 kg) (!) 305 lb 12.5 oz (138.7 kg)    Intake/Output Summary (Last 24 hours) at 08/01/16 1214 Last data filed at 08/01/16 1100  Gross per 24 hour  Intake                3 ml  Output             3175 ml  Net            -3172 ml    Telemetry: Sinus rhythm.  Exam:  General: Obese male, appears comfortable.  Lungs: There are, nonlabored.  Cardiac: RRR without gallop.  Abdomen: NABS.  Extremities: Status post right TM amputation.  Lab Results:  Basic Metabolic Panel:  Recent Labs Lab 07/29/16 0316 07/30/16 0411 08/01/16 0301  NA 140 138 137  K 3.6 3.6 3.7  CL 104 104 103  CO2 '26 24 24  ' GLUCOSE 109* 98 202*  BUN '8 10 14  ' CREATININE 1.61* 1.60* 1.70*  CALCIUM 8.7* 8.6* 8.4*    CBC:  Recent Labs Lab 07/28/16 0442 07/29/16 0316 08/01/16 0301  WBC 9.7 10.9* 9.4  HGB 9.5* 9.2* 8.8*  HCT 30.2* 29.6* 28.3*  MCV 86.8 86.5 87.1  PLT 283 317 283    Cardiac catheterization with PCI 8/70/2017:  Mid RCA lesion, 50 %stenosed.  Mid RCA to Dist RCA lesion, 10 %stenosed.  RPDA lesion, 30 %stenosed.  Mid Cx lesion, 75 %stenosed.  3rd Mrg lesion, 30 %stenosed.  Ost 1st Diag to 1st Diag lesion, 40 %stenosed.  Dist LAD lesion, 10 %stenosed.  Ost LAD to Prox LAD lesion, 85 %stenosed.  A STENT SYNERGY DES 3.5X20 drug eluting stent was successfully placed, and overlaps previously placed  stent.  Prox LAD to Mid LAD lesion, 85 %stenosed.  Post intervention, there is a 0% residual stenosis.  A STENT SYNERGY DES [id number] drug eluting stent was successfully placed, and does not overlap previously placed stent.  2nd Mrg lesion, 95 %stenosed.  Post intervention, there is a 0% residual stenosis.    Successful stenting of proximal 85% LAD stenosis with a Synergy 3.5 x 20 drug-eluting stent from the LAD ostium to the proximal vessel overlapping previously placed stents. A 0% stenosis is noted post implantation. Final postdilatation diameter of 3.75 mm.  Successful stenting of the second obtuse marginal from 90% to 0% using a Synergy 2.75 x 16 mm DES deployed at 14 atm. Postdilatation was not performed.  Total contrast 95 cc  ECG: Tracing from 07/30/2016 shows sinus rhythm with old anterior infarct pattern.   Medications:   Scheduled Medications: . allopurinol  300 mg Oral Daily  . amLODipine  10 mg Oral Daily  . antiseptic oral rinse  7 mL Mouth Rinse BID  . aspirin  81 mg Oral Daily  . ceFTAROline (TEFLARO) IV  600 mg Intravenous Q12H  . doxazosin  8 mg Oral Daily  . feeding supplement (GLUCERNA SHAKE)  237 mL Oral BID BM  . heparin  5,000 Units Subcutaneous Q8H  . hydrALAZINE  37.5 mg Oral BID  . insulin aspart  0-20 Units Subcutaneous TID WC  . insulin NPH Human  22 Units Subcutaneous QHS   And  . insulin NPH Human  20 Units Subcutaneous QAC breakfast  . isosorbide mononitrate  90 mg Oral Daily  . metoprolol succinate  75 mg Oral Daily  . pantoprazole  40 mg Oral Daily  . potassium chloride  20 mEq Oral Daily  . rosuvastatin  40 mg Oral q1800  . sodium chloride flush  3 mL Intravenous Q12H  . ticagrelor  90 mg Oral BID    Infusions: . sodium chloride 10 mL/hr at 07/31/16 1239    PRN Medications: sodium chloride, [DISCONTINUED] acetaminophen **OR** acetaminophen, acetaminophen, HYDROmorphone (DILAUDID) injection, methocarbamol **OR** methocarbamol (ROBAXIN)   IV, metoCLOPramide **OR** metoCLOPramide (REGLAN) injection, nitroGLYCERIN, ondansetron (ZOFRAN) IV, ondansetron **OR** [DISCONTINUED] ondansetron (ZOFRAN) IV, oxyCODONE-acetaminophen, sodium chloride flush   Assessment:   1. Multivessel CAD status post DES to the LAD and OM 2 on August 17.  2. Chronic combined heart failure with LVEF 45-50%.  3. CKD stage 3, creatinine 1.7.   4. Osteomyelitis status post right TMA amputation with plan for 3 weeks of Teflaro per ID.   Plan/Discussion:    Pending determination of inpatient rehabilitation versus SNF. Long-term antibiotics recommended per ID team for treatment of osteomyelitis. He remains stable on current cardiac regimen. Continue aspirin, Brilinta, Crestor, Toprol, XL Imdur, and hydralazine.   Joshua [name], M.D., F.A.C.C.

## 2016-08-02 DIAGNOSIS — I5043 Acute on chronic combined systolic (congestive) and diastolic (congestive) heart failure: Secondary | ICD-10-CM

## 2016-08-02 LAB — GLUCOSE, CAPILLARY
Glucose-Capillary: 160 mg/dL — ABNORMAL HIGH (ref 65–99)
Glucose-Capillary: 168 mg/dL — ABNORMAL HIGH (ref 65–99)
Glucose-Capillary: 221 mg/dL — ABNORMAL HIGH (ref 65–99)
Glucose-Capillary: 189 mg/dL — ABNORMAL HIGH (ref 65–99)

## 2016-08-02 LAB — CBC
HCT: 30.5 % — ABNORMAL LOW (ref 39.0–52.0)
Hemoglobin: 9.3 g/dL — ABNORMAL LOW (ref 13.0–17.0)
MCH: 26.6 pg (ref 26.0–34.0)
MCHC: 30.5 g/dL (ref 30.0–36.0)
MCV: 87.4 fL (ref 78.0–100.0)
Platelets: 311 10*3/uL (ref 150–400)
RBC: 3.49 MIL/uL — ABNORMAL LOW (ref 4.22–5.81)
RDW: 14.5 % (ref 11.5–15.5)
WBC: 10.2 10*3/uL (ref 4.0–10.5)

## 2016-08-02 LAB — BASIC METABOLIC PANEL
Anion gap: 7 (ref 5–15)
BUN: 14 mg/dL (ref 6–20)
CO2: 26 mmol/L (ref 22–32)
Calcium: 8.6 mg/dL — ABNORMAL LOW (ref 8.9–10.3)
Chloride: 104 mmol/L (ref 101–111)
Creatinine, Ser: 1.71 mg/dL — ABNORMAL HIGH (ref 0.61–1.24)
GFR calc Af Amer: 49 mL/min — ABNORMAL LOW (ref >60)
GFR calc non Af Amer: 43 mL/min — ABNORMAL LOW (ref >60)
Glucose, Bld: 206 mg/dL — ABNORMAL HIGH (ref 65–99)
Potassium: 3.7 mmol/L (ref 3.5–5.1)
Sodium: 137 mmol/L (ref 135–145)

## 2016-08-02 NOTE — Progress Notes (Signed)
I met with pt at bedside to discuss a possible inpt rehab admission pending [employer] approval. I have requested updated PT and OT notes for last seen by PT 8/17 and OT 8/15. I will begin authorization and make MD aware when insurance decision made. SW is aware, Clarise Cruz. [phone]

## 2016-08-02 NOTE — Progress Notes (Signed)
Insurance check completd BRILINTA 90 MG BID (30 ) 60 TAB   COVER- YES  CO-PAY- ZERO DOLLARS  TIER- 4 DRUG  PRIOR APPROVAL - NO  PHARMACY: ANY RETAIL

## 2016-08-02 NOTE — Progress Notes (Signed)
Primary cardiologist: Dr. [name]  Seen for followup: CAD  Subjective:    Denies CP or SOB, non-mobile sec to his foot wound.  Objective:   Temp:  [98.1 F (36.7 C)-98.6 F (37 C)] 98.1 F (36.7 C) (08/21 0341) Pulse Rate:  [71-78] 78 (08/21 0341) Resp:  [17-18] 18 (08/21 0341) BP: (124-166)/(70-78) 124/70 (08/21 0341) SpO2:  [96 %-98 %] 98 % (08/21 0341) Weight:  [301 lb 13 oz (136.9 kg)] 301 lb 13 oz (136.9 kg) (08/21 0341) Last BM Date: 08/02/16  Filed Weights   07/31/16 0449 08/01/16 0429 08/02/16 0341  Weight: (!) 306 lb (138.8 kg) (!) 305 lb 12.5 oz (138.7 kg) (!) 301 lb 13 oz (136.9 kg)    Intake/Output Summary (Last 24 hours) at 08/02/16 0913 Last data filed at 08/02/16 4128  Gross per 24 hour  Intake              243 ml  Output             2800 ml  Net            -2557 ml    Telemetry: Sinus rhythm.  Exam:  General: Obese male, appears comfortable.  Lungs: There are, nonlabored.  Cardiac: RRR without gallop.  Abdomen: NABS.  Extremities: Status post right TM amputation.  Lab Results:  Basic Metabolic Panel:  Recent Labs Lab 07/30/16 0411 08/01/16 0301 08/02/16 0510  NA 138 137 137  K 3.6 3.7 3.7  CL 104 103 104  CO2 _0 GLUCOSE 98 202* 206*  BUN _1 CREATININE 1.60* 1.70* 1.71*  CALCIUM 8.6* 8.4* 8.6*    CBC:  Recent Labs Lab 07/29/16 0316 08/01/16 0301 08/02/16 0510  WBC 10.9* 9.4 10.2  HGB 9.2* 8.8* 9.3*  HCT 29.6* 28.3* 30.5*  MCV 86.5 87.1 87.4  PLT 317 283 311    Cardiac catheterization with PCI 8/70/2017:  Mid RCA lesion, 50 %stenosed.  Mid RCA to Dist RCA lesion, 10 %stenosed.  RPDA lesion, 30 %stenosed.  Mid Cx lesion, 75 %stenosed.  3rd Mrg lesion, 30 %stenosed.  Ost 1st Diag to 1st Diag lesion, 40 %stenosed.  Dist LAD lesion, 10 %stenosed.  Ost LAD to Prox LAD lesion, 85 %stenosed.  A STENT SYNERGY DES 3.5X20 drug eluting stent was successfully placed, and overlaps previously  placed stent.  Prox LAD to Mid LAD lesion, 85 %stenosed.  Post intervention, there is a 0% residual stenosis.  A STENT SYNERGY DES [id number] drug eluting stent was successfully placed, and does not overlap previously placed stent.  2nd Mrg lesion, 95 %stenosed.  Post intervention, there is a 0% residual stenosis.    Successful stenting of proximal 85% LAD stenosis with a Synergy 3.5 x 20 drug-eluting stent from the LAD ostium to the proximal vessel overlapping previously placed stents. A 0% stenosis is noted post implantation. Final postdilatation diameter of 3.75 mm.  Successful stenting of the second obtuse marginal from 90% to 0% using a Synergy 2.75 x 16 mm DES deployed at 14 atm. Postdilatation was not performed.  Total contrast 95 cc  ECG: Tracing from 07/30/2016 shows sinus rhythm with old anterior infarct pattern.   Medications:   Scheduled Medications: . allopurinol  300 mg Oral Daily  . amLODipine  10 mg Oral Daily  . antiseptic oral rinse  7 mL Mouth Rinse BID  . aspirin  81 mg Oral Daily  . ceFTAROline (TEFLARO) IV  600 mg Intravenous Q12H  .  doxazosin  8 mg Oral Daily  . feeding supplement (GLUCERNA SHAKE)  237 mL Oral BID BM  . heparin  5,000 Units Subcutaneous Q8H  . hydrALAZINE  37.5 mg Oral BID  . insulin aspart  0-20 Units Subcutaneous TID WC  . insulin NPH Human  22 Units Subcutaneous QHS   And  . insulin NPH Human  20 Units Subcutaneous QAC breakfast  . isosorbide mononitrate  90 mg Oral Daily  . metoprolol succinate  75 mg Oral Daily  . pantoprazole  40 mg Oral Daily  . potassium chloride  20 mEq Oral Daily  . rosuvastatin  40 mg Oral q1800  . sodium chloride flush  3 mL Intravenous Q12H  . ticagrelor  90 mg Oral BID    Infusions: . sodium chloride 10 mL/hr at 07/31/16 1239    PRN Medications: sodium chloride, [DISCONTINUED] acetaminophen **OR** acetaminophen, acetaminophen, HYDROmorphone (DILAUDID) injection, methocarbamol **OR** methocarbamol  (ROBAXIN)  IV, metoCLOPramide **OR** metoCLOPramide (REGLAN) injection, nitroGLYCERIN, ondansetron (ZOFRAN) IV, ondansetron **OR** [DISCONTINUED] ondansetron (ZOFRAN) IV, oxyCODONE-acetaminophen, sodium chloride flush   Assessment:   1. Multivessel CAD status post DES to the LAD and OM 2 on August 17.  2. Acute on Chronic combined heart failure with LVEF 45-50%. Negative fluid balance, overnight - 2.5 L, stable crea at 1.7  3. CKD stage 3, creatinine 1.7.   4. Osteomyelitis status post right TMA amputation with plan for 3 weeks of Teflaro per ID.   Plan/Discussion:    Pending determination of inpatient rehabilitation versus SNF. Long-term antibiotics recommended per ID team for treatment of osteomyelitis. He remains stable on current cardiac regimen. Diuresing well, no symptoms, Continue aspirin, Brilinta, Crestor, Toprol, XL Imdur, and hydralazine.   Ena Dawley, M.D., F.A.C.C.

## 2016-08-02 NOTE — Progress Notes (Signed)
Occupational Therapy Treatment Patient Details Name: Joshua [name] MRN: [id number] DOB: 1956/09/07 Today's Date: 08/02/2016    History of present illness Patient is a 60 yo male admitted 07/18/16 with unstable angina.  Cath showed 3-vessel disease.  Patient declining CABG.  Patient s/p Rt transmetatarsal amputation 07/24/16.  PCI 8/17.    PMH:  CAD, MI, cardiac stents, DM, neuropathy, gastroparesis, Rt foot recurrent ulcers, CHF, HTN, morbid obesity, OSA on CPAP.  status post DES to the LAD and OM 2 on August 17.   OT comments  Pt making excellent progress. Improved endurance for ADL s/p cardiac surgery. Feel pt can reach mod I level with ADL at CIR. Pt very motivated to maximize his level of independence. Will continue to follow acutely to address established goals.   Follow Up Recommendations  CIR;Supervision/Assistance - 24 hour    Equipment Recommendations  Tub/shower bench;Wheelchair (measurements OT);Wheelchair cushion (measurements OT)    Recommendations for Other Services Rehab consult    Precautions / Restrictions Precautions Precautions: Fall Restrictions RUE Weight Bearing: Weight bearing as tolerated RLE Weight Bearing: Non weight bearing       Mobility Bed Mobility Overal bed mobility: Modified Independent             General bed mobility comments: OOB in chair  Transfers Overall transfer level: Needs assistance     Sit to Stand: Min assist            Balance     Sitting balance-Leahy Scale: Good       Standing balance-Leahy Scale: Poor - pt unable to stand with only 1 arm supporting at this time                     ADL Overall ADL's : Needs assistance/impaired     Grooming: Sitting;Set up   Upper Body Bathing: Set up;Sitting   Lower Body Bathing: Minimal assistance;Sitting/lateral leans   Upper Body Dressing : Set up;Sitting   Lower Body Dressing: Minimal assistance;Sit to/from stand   Toilet Transfer: Minimal assistance;+2  for safety/equipment;Ambulation;+2 for physical assistance   Toileting- Clothing Manipulation and Hygiene: Minimal assistance       Functional mobility during ADLs: Minimal assistance;+2 for safety/equipment General ADL Comments: Difficulty with balance with LB ADL. Will approach LB ADL using lateral leans to improve safety. May benefit from tiolet aid and long handled sponge. Improved endurance with ADL.      Vision                     Perception     Praxis      Cognition   Behavior During Therapy: WFL for tasks assessed/performed Overall Cognitive Status: Within Functional Limits for tasks assessed                       Extremity/Trunk Assessment               Exercises Other Exercises Other Exercises: BUE theraband exercises Other Exercises: chair push ups   Shoulder Instructions       General Comments      Pertinent Vitals/ Pain       Pain Assessment: No/denies pain  Home Living                                          Prior Functioning/Environment  Frequency Min 2X/week     Progress Toward Goals  OT Goals(current goals can now be found in the care plan section)  Progress towards OT goals: Progressing toward goals  Acute Rehab OT Goals Patient Stated Goal: to be independent again OT Goal Formulation: With patient Time For Goal Achievement: 08/09/16 Potential to Achieve Goals: Good ADL Goals Pt Will Perform Lower Body Bathing: with set-up;with supervision;sit to/from stand Pt Will Perform Lower Body Dressing: with set-up;with supervision;sit to/from stand Pt Will Transfer to Toilet: with supervision;ambulating;bedside commode Pt Will Perform Toileting - Clothing Manipulation and hygiene: with supervision;sitting/lateral leans;with adaptive equipment Pt/caregiver will Perform Home Exercise Program: Increased strength;Both right and left upper extremity;With theraband  Plan Discharge plan remains  appropriate    Co-evaluation                 End of Session Equipment Utilized During Treatment: Gait belt;Rolling walker   Activity Tolerance Patient tolerated treatment well   Patient Left in chair;with call bell/phone within reach;with chair alarm set   Nurse Communication Mobility status        Time: 9371-6967 OT Time Calculation (min): 13 min  Charges: OT General Charges $OT Visit: 1 Procedure OT Treatments $Therapeutic Activity: 8-22 mins  Steffie Waggoner,HILLARY 08/02/2016, 1:27 PM   [facility], OT/L  [phone] 08/02/2016

## 2016-08-02 NOTE — Progress Notes (Signed)
Physical Therapy Treatment Patient Details Name: Joshua [name] MRN: [id number] DOB: [birth year] Today's Date: 08/02/2016    History of Present Illness Patient is a 60 yo male admitted 07/18/16 with unstable angina.  Cath showed 3-vessel disease.  Patient declining CABG.  Patient s/p Rt transmetatarsal amputation 07/24/16.  PCI 8/17.    PMH:  CAD, MI, cardiac stents, DM, neuropathy, gastroparesis, Rt foot recurrent ulcers, CHF, HTN, morbid obesity, OSA on CPAP    PT Comments    Pt very pleasant and able to increase activity, ambulation and function today. Pt educated for gait, safety with transfers and HEP. Pt encouraged to be OOB daily with nursing as well as to ask for assist for bath setup as he reported no bath over the weekend. Will continue to follow with pt demonstrating fatigue with limited gait but improving.   Follow Up Recommendations  CIR     Equipment Recommendations  Rolling walker with 5" wheels;3in1 (PT)    Recommendations for Other Services       Precautions / Restrictions Precautions Precautions: Fall Restrictions RUE Weight Bearing: Weight bearing as tolerated RLE Weight Bearing: Non weight bearing    Mobility  Bed Mobility Overal bed mobility: Modified Independent             General bed mobility comments: with use of rail   Transfers Overall transfer level: Needs assistance     Sit to Stand: Min assist         General transfer comment: min assist to stabilize RW as pt using it to assist with standing from chair. Cues for hand placement and anterior translation. No physical assist needed from elevated bed. Total of 4 trials  Ambulation/Gait Ambulation/Gait assistance: Min guard;+2 safety/equipment Ambulation Distance (Feet): 22 Feet Assistive device: Rolling walker (2 wheeled) Gait Pattern/deviations: Step-to pattern   Gait velocity interpretation: Below normal speed for age/gender General Gait Details: Pt completed 4 trials of 20' with gait  today with seated rest between each. pt fatigued with each trial, chair pulled behind with HR 89-92 and sats 95-98% on RA. cues for posture, hop length and safety. Pt with one LOB end of session when trying to look behind him while standing with 2 person assist to correct.    Stairs            Wheelchair Mobility    Modified Rankin (Stroke Patients Only)       Balance     Sitting balance-Leahy Scale: Good       Standing balance-Leahy Scale: Poor                      Cognition Arousal/Alertness: Awake/alert Behavior During Therapy: WFL for tasks assessed/performed Overall Cognitive Status: Within Functional Limits for tasks assessed                      Exercises General Exercises - Lower Extremity Ankle Circles/Pumps: Both;10 reps;Seated;AROM Long Arc Quad: AROM;Right;10 reps;Seated Hip Flexion/Marching: AROM;Right;10 reps;Seated    General Comments        Pertinent Vitals/Pain Pain Assessment: No/denies pain    Home Living                      Prior Function            PT Goals (current goals can now be found in the care plan section) Progress towards PT goals: Progressing toward goals    Frequency  PT Plan Current plan remains appropriate    Co-evaluation             End of Session Equipment Utilized During Treatment: Gait belt Activity Tolerance: Patient tolerated treatment well Patient left: in chair;with call bell/phone within reach;with chair alarm set     Time: 6073-7106 PT Time Calculation (min) (ACUTE ONLY): 29 min  Charges:  $Gait Training: 23-37 mins                    G Codes:      Melford Aase 2016/08/14, 12:29 PM Elwyn Reach, [place]

## 2016-08-03 ENCOUNTER — Encounter (HOSPITAL_COMMUNITY): Payer: Self-pay | Admitting: Cardiology

## 2016-08-03 ENCOUNTER — Inpatient Hospital Stay (HOSPITAL_COMMUNITY)
Admission: RE | Admit: 2016-08-03 | Discharge: 2016-08-13 | DRG: 560 | Disposition: A | Discharge status: Home Health | Payer: BLUE CROSS/BLUE SHIELD | Source: Intra-hospital | Attending: Physical Medicine & Rehabilitation | Admitting: Physical Medicine & Rehabilitation

## 2016-08-03 DIAGNOSIS — Z955 Presence of coronary angioplasty implant and graft: Secondary | ICD-10-CM | POA: Diagnosis not present

## 2016-08-03 DIAGNOSIS — Z79899 Other long term (current) drug therapy: Secondary | ICD-10-CM

## 2016-08-03 DIAGNOSIS — I5042 Chronic combined systolic (congestive) and diastolic (congestive) heart failure: Secondary | ICD-10-CM | POA: Diagnosis present

## 2016-08-03 DIAGNOSIS — E1142 Type 2 diabetes mellitus with diabetic polyneuropathy: Secondary | ICD-10-CM

## 2016-08-03 DIAGNOSIS — E1122 Type 2 diabetes mellitus with diabetic chronic kidney disease: Secondary | ICD-10-CM | POA: Diagnosis present

## 2016-08-03 DIAGNOSIS — M869 Osteomyelitis, unspecified: Secondary | ICD-10-CM | POA: Diagnosis present

## 2016-08-03 DIAGNOSIS — N179 Acute kidney failure, unspecified: Secondary | ICD-10-CM | POA: Diagnosis not present

## 2016-08-03 DIAGNOSIS — R5381 Other malaise: Secondary | ICD-10-CM | POA: Diagnosis present

## 2016-08-03 DIAGNOSIS — Z4781 Encounter for orthopedic aftercare following surgical amputation: Secondary | ICD-10-CM | POA: Diagnosis not present

## 2016-08-03 DIAGNOSIS — Z89431 Acquired absence of right foot: Secondary | ICD-10-CM

## 2016-08-03 DIAGNOSIS — Z6841 Body Mass Index (BMI) 40.0 and over, adult: Secondary | ICD-10-CM

## 2016-08-03 DIAGNOSIS — Z794 Long term (current) use of insulin: Secondary | ICD-10-CM

## 2016-08-03 DIAGNOSIS — N189 Chronic kidney disease, unspecified: Secondary | ICD-10-CM | POA: Diagnosis present

## 2016-08-03 DIAGNOSIS — E785 Hyperlipidemia, unspecified: Secondary | ICD-10-CM | POA: Diagnosis present

## 2016-08-03 DIAGNOSIS — D62 Acute posthemorrhagic anemia: Secondary | ICD-10-CM | POA: Diagnosis present

## 2016-08-03 DIAGNOSIS — I130 Hypertensive heart and chronic kidney disease with heart failure and stage 1 through stage 4 chronic kidney disease, or unspecified chronic kidney disease: Secondary | ICD-10-CM | POA: Diagnosis present

## 2016-08-03 DIAGNOSIS — T8130XA Disruption of wound, unspecified, initial encounter: Secondary | ICD-10-CM | POA: Diagnosis present

## 2016-08-03 DIAGNOSIS — E119 Type 2 diabetes mellitus without complications: Secondary | ICD-10-CM

## 2016-08-03 DIAGNOSIS — N183 Chronic kidney disease, stage 3 (moderate): Secondary | ICD-10-CM | POA: Diagnosis not present

## 2016-08-03 DIAGNOSIS — Z7982 Long term (current) use of aspirin: Secondary | ICD-10-CM

## 2016-08-03 DIAGNOSIS — I2510 Atherosclerotic heart disease of native coronary artery without angina pectoris: Secondary | ICD-10-CM | POA: Diagnosis present

## 2016-08-03 DIAGNOSIS — L0390 Cellulitis, unspecified: Secondary | ICD-10-CM | POA: Diagnosis not present

## 2016-08-03 DIAGNOSIS — L0291 Cutaneous abscess, unspecified: Secondary | ICD-10-CM | POA: Diagnosis not present

## 2016-08-03 LAB — GLUCOSE, CAPILLARY
Glucose-Capillary: 196 mg/dL — ABNORMAL HIGH (ref 65–99)
Glucose-Capillary: 233 mg/dL — ABNORMAL HIGH (ref 65–99)
Glucose-Capillary: 226 mg/dL — ABNORMAL HIGH (ref 65–99)

## 2016-08-03 LAB — BASIC METABOLIC PANEL
Anion gap: 5 (ref 5–15)
BUN: 20 mg/dL (ref 6–20)
CO2: 26 mmol/L (ref 22–32)
Calcium: 8.5 mg/dL — ABNORMAL LOW (ref 8.9–10.3)
Chloride: 104 mmol/L (ref 101–111)
Creatinine, Ser: 1.93 mg/dL — ABNORMAL HIGH (ref 0.61–1.24)
GFR calc Af Amer: 43 mL/min — ABNORMAL LOW (ref >60)
GFR calc non Af Amer: 37 mL/min — ABNORMAL LOW (ref >60)
Glucose, Bld: 264 mg/dL — ABNORMAL HIGH (ref 65–99)
Potassium: 4.1 mmol/L (ref 3.5–5.1)
Sodium: 135 mmol/L (ref 135–145)

## 2016-08-03 LAB — CBC
HCT: 29.9 % — ABNORMAL LOW (ref 39.0–52.0)
Hemoglobin: 9.2 g/dL — ABNORMAL LOW (ref 13.0–17.0)
MCH: 26.8 pg (ref 26.0–34.0)
MCHC: 30.8 g/dL (ref 30.0–36.0)
MCV: 87.2 fL (ref 78.0–100.0)
Platelets: 320 10*3/uL (ref 150–400)
RBC: 3.43 MIL/uL — ABNORMAL LOW (ref 4.22–5.81)
RDW: 14.5 % (ref 11.5–15.5)
WBC: 10.5 10*3/uL (ref 4.0–10.5)

## 2016-08-03 MED ORDER — METHOCARBAMOL 500 MG PO TABS
500.0000 mg | ORAL_TABLET | Freq: Four times a day (QID) | ORAL | Status: DC | PRN
  Filled 2016-08-03: qty 1

## 2016-08-03 MED ORDER — ACETAMINOPHEN 325 MG PO TABS: 650.0000 mg | ORAL_TABLET | ORAL | Status: DC | PRN

## 2016-08-03 MED ORDER — POTASSIUM CHLORIDE CRYS ER 20 MEQ PO TBCR: 20.0000 meq | EXTENDED_RELEASE_TABLET | Freq: Every day | ORAL | Status: DC

## 2016-08-03 MED ORDER — ISOSORBIDE MONONITRATE ER 30 MG PO TB24
90.0000 mg | ORAL_TABLET | Freq: Every day | ORAL | Status: DC
  Administered 2016-08-04 – 2016-08-13 (×10): 90 mg via ORAL
  Filled 2016-08-03 (×10): qty 3

## 2016-08-03 MED ORDER — METOPROLOL SUCCINATE ER 25 MG PO TB24: 75.0000 mg | ORAL_TABLET | Freq: Every day | ORAL | Status: DC

## 2016-08-03 MED ORDER — SODIUM CHLORIDE 0.9% FLUSH: 10.0000 mL | INTRAVENOUS | Status: DC | PRN

## 2016-08-03 MED ORDER — ONDANSETRON HCL 4 MG PO TABS: 4.0000 mg | ORAL_TABLET | Freq: Four times a day (QID) | ORAL | Status: DC | PRN

## 2016-08-03 MED ORDER — INSULIN NPH (HUMAN) (ISOPHANE) 100 UNIT/ML ~~LOC~~ SUSP: 22.0000 [IU] | Freq: Every day | SUBCUTANEOUS | 11 refills | Status: DC

## 2016-08-03 MED ORDER — SODIUM CHLORIDE 0.9% FLUSH: 3.0000 mL | Freq: Two times a day (BID) | INTRAVENOUS | Status: DC

## 2016-08-03 MED ORDER — INSULIN NPH (HUMAN) (ISOPHANE) 100 UNIT/ML ~~LOC~~ SUSP: 20.0000 [IU] | Freq: Every day | SUBCUTANEOUS | 11 refills | Status: DC

## 2016-08-03 MED ORDER — POTASSIUM CHLORIDE CRYS ER 20 MEQ PO TBCR
20.0000 meq | EXTENDED_RELEASE_TABLET | Freq: Every day | ORAL | Status: DC
  Administered 2016-08-04 – 2016-08-13 (×10): 20 meq via ORAL
  Filled 2016-08-03 (×10): qty 1

## 2016-08-03 MED ORDER — INSULIN ASPART 100 UNIT/ML ~~LOC~~ SOLN
0.0000 [IU] | Freq: Three times a day (TID) | SUBCUTANEOUS | Status: DC
  Administered 2016-08-04: 7 [IU] via SUBCUTANEOUS
  Administered 2016-08-04 – 2016-08-05 (×4): 3 [IU] via SUBCUTANEOUS
  Administered 2016-08-06: 4 [IU] via SUBCUTANEOUS
  Administered 2016-08-07: 3 [IU] via SUBCUTANEOUS
  Administered 2016-08-08: 4 [IU] via SUBCUTANEOUS
  Administered 2016-08-09 – 2016-08-11 (×3): 3 [IU] via SUBCUTANEOUS
  Administered 2016-08-12: 4 [IU] via SUBCUTANEOUS
  Administered 2016-08-12 – 2016-08-13 (×2): 3 [IU] via SUBCUTANEOUS

## 2016-08-03 MED ORDER — TICAGRELOR 90 MG PO TABS
90.0000 mg | ORAL_TABLET | Freq: Two times a day (BID) | ORAL | Status: DC
  Administered 2016-08-03 – 2016-08-13 (×20): 90 mg via ORAL
  Filled 2016-08-03 (×20): qty 1

## 2016-08-03 MED ORDER — INSULIN NPH (HUMAN) (ISOPHANE) 100 UNIT/ML ~~LOC~~ SUSP
20.0000 [IU] | Freq: Every day | SUBCUTANEOUS | Status: DC
  Administered 2016-08-04 – 2016-08-13 (×10): 20 [IU] via SUBCUTANEOUS
  Filled 2016-08-03: qty 10

## 2016-08-03 MED ORDER — ASPIRIN 81 MG PO CHEW
81.0000 mg | CHEWABLE_TABLET | Freq: Every day | ORAL | Status: DC
  Administered 2016-08-04 – 2016-08-13 (×10): 81 mg via ORAL
  Filled 2016-08-03 (×10): qty 1

## 2016-08-03 MED ORDER — HEPARIN SODIUM (PORCINE) 5000 UNIT/ML IJ SOLN: 5000.0000 [IU] | Freq: Three times a day (TID) | INTRAMUSCULAR | Status: DC

## 2016-08-03 MED ORDER — TICAGRELOR 90 MG PO TABS: 90.0000 mg | ORAL_TABLET | Freq: Two times a day (BID) | ORAL | Status: DC

## 2016-08-03 MED ORDER — ONDANSETRON HCL 4 MG/2ML IJ SOLN: 4.0000 mg | Freq: Four times a day (QID) | INTRAMUSCULAR | Status: DC | PRN

## 2016-08-03 MED ORDER — GLUCERNA SHAKE PO LIQD: 237.0000 mL | Freq: Two times a day (BID) | ORAL | 0 refills | Status: DC

## 2016-08-03 MED ORDER — METHOCARBAMOL 500 MG PO TABS: 500.0000 mg | ORAL_TABLET | Freq: Four times a day (QID) | ORAL | Status: DC | PRN

## 2016-08-03 MED ORDER — NITROGLYCERIN 0.4 MG SL SUBL: 0.4000 mg | SUBLINGUAL_TABLET | SUBLINGUAL | Status: DC | PRN

## 2016-08-03 MED ORDER — DOXAZOSIN MESYLATE 8 MG PO TABS
8.0000 mg | ORAL_TABLET | Freq: Every day | ORAL | Status: DC
  Administered 2016-08-04 – 2016-08-13 (×9): 8 mg via ORAL
  Filled 2016-08-03 (×10): qty 1

## 2016-08-03 MED ORDER — OXYCODONE-ACETAMINOPHEN 5-325 MG PO TABS
1.0000 | ORAL_TABLET | ORAL | Status: DC | PRN
  Administered 2016-08-03 – 2016-08-04 (×2): 1 via ORAL
  Administered 2016-08-05 – 2016-08-08 (×5): 2 via ORAL
  Administered 2016-08-09 – 2016-08-10 (×3): 1 via ORAL
  Administered 2016-08-11 – 2016-08-12 (×2): 2 via ORAL
  Filled 2016-08-03: qty 1
  Filled 2016-08-03 (×5): qty 2
  Filled 2016-08-03: qty 1
  Filled 2016-08-03: qty 2
  Filled 2016-08-03: qty 1
  Filled 2016-08-03 (×3): qty 2

## 2016-08-03 MED ORDER — ALLOPURINOL 300 MG PO TABS
300.0000 mg | ORAL_TABLET | Freq: Every day | ORAL | Status: DC
  Administered 2016-08-04 – 2016-08-13 (×10): 300 mg via ORAL
  Filled 2016-08-03 (×4): qty 1
  Filled 2016-08-03 (×2): qty 3
  Filled 2016-08-03: qty 1
  Filled 2016-08-03 (×2): qty 3
  Filled 2016-08-03: qty 1

## 2016-08-03 MED ORDER — SORBITOL 70 % SOLN: 30.0000 mL | Freq: Every day | Status: DC | PRN

## 2016-08-03 MED ORDER — ONDANSETRON HCL 4 MG PO TABS: 4.0000 mg | ORAL_TABLET | Freq: Four times a day (QID) | ORAL | 0 refills | Status: DC | PRN

## 2016-08-03 MED ORDER — METOCLOPRAMIDE HCL 5 MG PO TABS: 5.0000 mg | ORAL_TABLET | Freq: Three times a day (TID) | ORAL | Status: DC | PRN

## 2016-08-03 MED ORDER — CEFTAROLINE FOSAMIL 600 MG IV SOLR
600.0000 mg | Freq: Two times a day (BID) | INTRAVENOUS | Status: DC
  Administered 2016-08-03 – 2016-08-13 (×20): 600 mg via INTRAVENOUS
  Filled 2016-08-03 (×23): qty 600

## 2016-08-03 MED ORDER — METHOCARBAMOL 1000 MG/10ML IJ SOLN
500.0000 mg | Freq: Four times a day (QID) | INTRAVENOUS | Status: DC | PRN
  Filled 2016-08-03: qty 5

## 2016-08-03 MED ORDER — ISOSORBIDE MONONITRATE ER 30 MG PO TB24: 90.0000 mg | ORAL_TABLET | Freq: Every day | ORAL | Status: DC

## 2016-08-03 MED ORDER — ROSUVASTATIN CALCIUM 20 MG PO TABS
40.0000 mg | ORAL_TABLET | Freq: Every day | ORAL | Status: DC
  Administered 2016-08-04 – 2016-08-12 (×9): 40 mg via ORAL
  Filled 2016-08-03 (×2): qty 2
  Filled 2016-08-03 (×2): qty 1
  Filled 2016-08-03 (×4): qty 2
  Filled 2016-08-03: qty 1
  Filled 2016-08-03: qty 2

## 2016-08-03 MED ORDER — INSULIN ASPART 100 UNIT/ML ~~LOC~~ SOLN: 0.0000 [IU] | Freq: Three times a day (TID) | SUBCUTANEOUS | 11 refills | Status: DC

## 2016-08-03 MED ORDER — METOPROLOL SUCCINATE ER 50 MG PO TB24
75.0000 mg | ORAL_TABLET | Freq: Every day | ORAL | Status: DC
  Administered 2016-08-04 – 2016-08-13 (×10): 75 mg via ORAL
  Filled 2016-08-03 (×10): qty 1

## 2016-08-03 MED ORDER — SODIUM CHLORIDE 0.9% FLUSH
10.0000 mL | INTRAVENOUS | Status: DC | PRN
  Administered 2016-08-07 – 2016-08-11 (×4): 10 mL
  Filled 2016-08-03 (×4): qty 40

## 2016-08-03 MED ORDER — HYDRALAZINE HCL 25 MG PO TABS
37.5000 mg | ORAL_TABLET | Freq: Two times a day (BID) | ORAL | Status: DC
  Administered 2016-08-03 – 2016-08-13 (×20): 37.5 mg via ORAL
  Filled 2016-08-03 (×20): qty 2

## 2016-08-03 MED ORDER — AMLODIPINE BESYLATE 10 MG PO TABS: 10.0000 mg | ORAL_TABLET | Freq: Every day | ORAL | Status: DC

## 2016-08-03 MED ORDER — HEPARIN SODIUM (PORCINE) 5000 UNIT/ML IJ SOLN
5000.0000 [IU] | Freq: Three times a day (TID) | INTRAMUSCULAR | Status: DC
  Administered 2016-08-03 – 2016-08-13 (×29): 5000 [IU] via SUBCUTANEOUS
  Filled 2016-08-03 (×29): qty 1

## 2016-08-03 MED ORDER — OXYCODONE-ACETAMINOPHEN 5-325 MG PO TABS: 1.0000 | ORAL_TABLET | ORAL | 0 refills | Status: DC | PRN

## 2016-08-03 MED ORDER — AMLODIPINE BESYLATE 10 MG PO TABS
10.0000 mg | ORAL_TABLET | Freq: Every day | ORAL | Status: DC
  Administered 2016-08-04 – 2016-08-13 (×10): 10 mg via ORAL
  Filled 2016-08-03 (×10): qty 1

## 2016-08-03 MED ORDER — INSULIN NPH (HUMAN) (ISOPHANE) 100 UNIT/ML ~~LOC~~ SUSP
22.0000 [IU] | Freq: Every day | SUBCUTANEOUS | Status: DC
  Administered 2016-08-03 – 2016-08-12 (×10): 22 [IU] via SUBCUTANEOUS
  Filled 2016-08-03 (×2): qty 10

## 2016-08-03 MED ORDER — PANTOPRAZOLE SODIUM 40 MG PO TBEC
40.0000 mg | DELAYED_RELEASE_TABLET | Freq: Every day | ORAL | Status: DC
  Administered 2016-08-04 – 2016-08-13 (×10): 40 mg via ORAL
  Filled 2016-08-03 (×10): qty 1

## 2016-08-03 MED ORDER — SODIUM CHLORIDE 0.9% FLUSH: 3.0000 mL | INTRAVENOUS | Status: DC | PRN

## 2016-08-03 NOTE — Interval H&P Note (Signed)
Joshua [name] was admitted today to Inpatient Rehabilitation with the diagnosis of debility and right TMA.  The patient's history has been reviewed, patient examined, and there is no change in status.  Patient continues to be appropriate for intensive inpatient rehabilitation.  I have reviewed the patient's chart and labs.  Questions were answered to the patient's satisfaction. The PAPE has been reviewed and assessment remains appropriate.  Joshua [name] T 08/03/2016, 6:25 PM

## 2016-08-03 NOTE — Progress Notes (Signed)
Cristina Gong, RN Rehab Admission Coordinator Signed Physical Medicine and Rehabilitation  PMR Pre-admission Date of Service: 08/03/2016 12:29 PM  Related encounter: ED to Hosp-Admission (Current) from 07/18/2016 in [place]       '[]'$ Hide copied text PMR Admission Coordinator Pre-Admission Assessment  Patient: Joshua [name] is an 60 y.o., male MRN: [id number] DOB: August 29, 1956 Height: $RemoveBefo'5\' 9"'BHBUDqvUWJm$  (175.3 cm) Weight: 135.3 kg (298 lb 4.5 oz)                                                                                                                                                                                                                                                                          Insurance Information HMO:     PPO: yes     PCP:      IPA:      80/20:      OTHER:  PRIMARY: [employer] [place]      Policy#: [id number]      Subscriber: pt CM Name: Amy      Phone#: [phone] ext [phone]     Fax#: [phone] Pre-Cert#: [id number]      Employer: unemployed. Approved 8/22 until 8/30 when updates are due Benefits:  Phone #: [phone]     Name: 08/02/16 Eff. Date: 04/12/2016     Deduct: $1000      Out of Pocket Max: $2350      Life Max: none CIR: 90%      SNF: 90% 60 days Outpatient: 90%     Co-Pay:  30 visits combined Home Health: 90%      Co-Pay: 10% DME: 90%     Co-Pay: 10% Providers: in network  SECONDARY: none       Medicaid Application Date:       Case Manager:  Disability Application Date:       Case Worker:   Emergency Contact Information        Contact Information    Name Relation Home Work [place] Spouse   [phone]   Schroth,Chris Son   [phone]     Current Medical History  Patient Admitting Diagnosis: PAD, right transmetatarsal amputation  History of Present Illness: Cullin Dishman  McNeillis a 60 y.o.malewith history of CAD, T2DM with gastroparesis,Chronic renal insufficiency with creatinine  1.60-1.78,chronic diabetic foot ulcer and polyneuropathy, morbid obesity, pulmonary sarcoidosis.  Admitted on 07/18/16 with unstable angina and ACS. He was placed on IV heparin/IV NTG and cardiac cath revealed 3 vessel disease with normal LVF. Dr. Servando Snare recommended CABG but patient declined and elected PCI. He was found to have abscess in plantar soft tissue at distal phalanx with osteomyelitis of right great toe. Dr. Tommy Medal consulted and recommended amputation . PCI deferred till infection under control and he underwent Right great toe trans metartarsal amputation on 08/12 by Dr. Sharol Given.Placed on intravenous TEFLARO08/13/2017 3 weeks.Prevena VAC placed and patient to be NWB RLE. VAC removed 08/03/16.He was started on IV antibiotics and 2 weeks recommended for treatment per ID. Subcutaneous heparin for DVT prophylaxis. He was hydrated for management of acute renal failure and underwent cardiac cath with PCI 07/28/2016 and maintained on aspirin as well as Brilinta.   Past Medical History      Past Medical History:  Diagnosis Date  . Chronic combined systolic and diastolic CHF (congestive heart failure) ([place])    a. 07/2015 Echo: EF 45-50%, mod LVH, mid apical/antsept AK, Gr 1 DD.  [name] Coronary artery disease    a. 2012 s/p MI/PCI: s/p DES to mid/dist RCA and overlapping DES to LAD;  b. 07/2015 Cath: LAD 10% ISR, D1 40(jailed), LCX 57m, OM2 30, OM3 30, RCA 93m ISR, 10d ISR.  . Diabetic gastroparesis ([place])   . Essential hypertension   . GERD (gastroesophageal reflux disease)   . Gout   . Heart murmur   . Hyperlipemia   . Morbid obesity ([place])   . Myocardial infarction ([place]) 2012  . OSA on CPAP   . Polyneuropathy in diabetes(357.2)   . Pulmonary sarcoidosis ([place])    "problems w/it years ago; no problems anymore" (07/03/2014)  . Type II diabetes mellitus (H[facility])     Family History  family history includes Diabetes in his maternal grandmother; Heart attack in his maternal  aunt; Hypertension in his maternal grandmother.  Prior Rehab/Hospitalizations:  Has the patient had major surgery during 100 days prior to admission? No  Current Medications   Current Facility-Administered Medications:  .  0.9 %  sodium chloride infusion, , Intravenous, Continuous, [name], MD, Last Rate: 10 mL/hr at 07/31/16 1239 .  0.9 %  sodium chloride infusion, 250 mL, Intravenous, PRN, Belva Crome, MD .  [DISCONTINUED] acetaminophen (TYLENOL) tablet 650 mg, 650 mg, Oral, Q6H PRN **OR** acetaminophen (TYLENOL) suppository 650 mg, 650 mg, Rectal, Q6H PRN, [name], MD .  acetaminophen (TYLENOL) tablet 650 mg, 650 mg, Oral, Q4H PRN, Belva Crome, MD, 650 mg at 07/30/16 1737 .  allopurinol (ZYLOPRIM) tablet 300 mg, 300 mg, Oral, Daily, Jacolyn Reedy, MD, 300 mg at 08/03/16 1000 .  amLODipine (NORVASC) tablet 10 mg, 10 mg, Oral, Daily, 10 mg at 08/03/16 1002 **AND** [DISCONTINUED] irbesartan (AVAPRO) tablet 300 mg, 300 mg, Oral, Daily, [name], MD, 300 mg at 07/19/16 1019 .  antiseptic oral rinse (CPC / CETYLPYRIDINIUM CHLORIDE 0.05%) solution 7 mL, 7 mL, Mouth Rinse, BID, [name], MD, 7 mL at 08/01/16 2200 .  aspirin chewable tablet 81 mg, 81 mg, Oral, Daily, Belva Crome, MD, 81 mg at 08/03/16 1002 .  ceftaroline (TEFLARO) 600 mg in sodium chloride 0.9 % 250 mL IVPB, 600 mg, Intravenous, Q12H, Truman Hayward, MD, 600 mg at 08/03/16  1000 .  doxazosin (CARDURA) tablet 8 mg, 8 mg, Oral, Daily, Jacolyn Reedy, MD, 8 mg at 08/03/16 1000 .  feeding supplement (GLUCERNA SHAKE) (GLUCERNA SHAKE) liquid 237 mL, 237 mL, Oral, BID BM, [name], MD, 237 mL at 08/01/16 1400 .  heparin injection 5,000 Units, 5,000 Units, Subcutaneous, Q8H, Belva Crome, MD, 5,000 Units at 08/02/16 2157 .  hydrALAZINE (APRESOLINE) tablet 37.5 mg, 37.5 mg, Oral, BID, Jacolyn Reedy, MD, 37.5 mg at 08/03/16 1002 .  HYDROmorphone (DILAUDID) injection 1 mg, 1 mg, Intravenous,  Q2H PRN, [name], MD, 1 mg at 07/26/16 2002 .  insulin aspart (novoLOG) injection 0-20 Units, 0-20 Units, Subcutaneous, TID WC, Jacolyn Reedy, MD, 7 Units at 08/02/16 1721 .  insulin NPH Human (HUMULIN N,NOVOLIN N) injection 20 Units, 20 Units, Subcutaneous, QAC breakfast, 20 Units at 08/03/16 1005 **AND** insulin NPH Human (HUMULIN N,NOVOLIN N) injection 22 Units, 22 Units, Subcutaneous, QHS, Mir Marry Guan, MD, 22 Units at 08/02/16 2158 .  isosorbide mononitrate (IMDUR) 24 hr tablet 90 mg, 90 mg, Oral, Daily, [name], MD, 90 mg at 08/03/16 1001 .  methocarbamol (ROBAXIN) tablet 500 mg, 500 mg, Oral, Q6H PRN, 500 mg at 07/29/16 2243 **OR** methocarbamol (ROBAXIN) 500 mg in dextrose 5 % 50 mL IVPB, 500 mg, Intravenous, Q6H PRN, [name], MD .  metoCLOPramide (REGLAN) tablet 5-10 mg, 5-10 mg, Oral, Q8H PRN **OR** metoCLOPramide (REGLAN) injection 5-10 mg, 5-10 mg, Intravenous, Q8H PRN, [name], MD .  metoprolol succinate (TOPROL-XL) 24 hr tablet 75 mg, 75 mg, Oral, Daily, [name], MD, 75 mg at 08/03/16 1001 .  nitroGLYCERIN (NITROSTAT) SL tablet 0.4 mg, 0.4 mg, Sublingual, Q5 Min x 3 PRN, Jacolyn Reedy, MD .  ondansetron [facility]) injection 4 mg, 4 mg, Intravenous, Q6H PRN, Belva Crome, MD, 4 mg at 07/29/16 1747 .  ondansetron (ZOFRAN) tablet 4 mg, 4 mg, Oral, Q6H PRN **OR** [DISCONTINUED] ondansetron (ZOFRAN) injection 4 mg, 4 mg, Intravenous, Q6H PRN, [name], MD .  oxyCODONE-acetaminophen (PERCOCET/ROXICET) 5-325 MG per tablet 1-2 tablet, 1-2 tablet, Oral, Q4H PRN, Belva Crome, MD, 2 tablet at 08/02/16 2342 .  pantoprazole (PROTONIX) EC tablet 40 mg, 40 mg, Oral, Daily, Jacolyn Reedy, MD, 40 mg at 08/03/16 1001 .  potassium chloride SA (K-DUR,KLOR-CON) CR tablet 20 mEq, 20 mEq, Oral, Daily, Mir Marry Guan, MD, 20 mEq at 08/03/16 1002 .  rosuvastatin (CRESTOR) tablet 40 mg, 40 mg, Oral, q1800, Ihor Austin, RPH, 40 mg at 08/02/16  1721 .  sodium chloride flush (NS) 0.9 % injection 3 mL, 3 mL, Intravenous, Q12H, Belva Crome, MD, 3 mL at 08/02/16 2203 .  sodium chloride flush (NS) 0.9 % injection 3 mL, 3 mL, Intravenous, PRN, Belva Crome, MD .  ticagrelor [facility]) tablet 90 mg, 90 mg, Oral, BID, Belva Crome, MD, 90 mg at 08/03/16 1002  Patients Current Diet: Diet heart healthy/carb modified Room service appropriate? Yes; Fluid consistency: Thin  Precautions / Restrictions Precautions Precautions: Fall Precaution Comments: Patient with DOE awaiting PCI.  New transmetatarsal amputation. Stints scheduled 8/17; VAC on right foot Restrictions Weight Bearing Restrictions: Yes RUE Weight Bearing: Weight bearing as tolerated RLE Weight Bearing: Non weight bearing   Has the patient had 2 or more falls or a fall with injury in the past year?No  Prior Activity Level Community (5-7x/wk): driving and Independent pta. Laid off in April and was to start  a new job. 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 crutches  Prior Functional Level Prior Function Level of Independence: Independent Comments: Drives.  Recently laid off.  Self Care: Did the patient need help bathing, dressing, using the toilet or eating?  Independent  Indoor Mobility: Did the patient need assistance with walking from room to room (with or without device)? Independent  Stairs: Did the patient need assistance with internal or external stairs (with or without device)? Independent  Functional Cognition: Did the patient need help planning regular tasks such as shopping or remembering to take medications? Independent  Current Functional Level Cognition Overall Cognitive Status: Within Functional Limits for tasks assessed Orientation Level: Oriented X4     Extremity Assessment (includes Sensation/Coordination) Upper Extremity Assessment:  (B shoulder issues but functional)  Lower Extremity Assessment: RLE deficits/detail (s/p surgery) RLE Deficits / Details: Transmetatarsal amputation with VAC in place.  Edema noted. RLE Sensation: history of peripheral neuropathy LLE Deficits / Details: Noted edema LLE Sensation: history of peripheral neuropathy   ADLs Overall ADL's : Needs assistance/impaired Grooming: Sitting, Set up Upper Body Bathing: Set up, Sitting Lower Body Bathing: Minimal assistance, Sitting/lateral leans Upper Body Dressing : Set up, Sitting Lower Body Dressing: Minimal assistance, Sit to/from stand Toilet Transfer: Minimal assistance, +2 for safety/equipment, Ambulation, +2 for physical assistance Toileting- Clothing Manipulation and Hygiene: Minimal assistance Functional mobility during ADLs: Minimal assistance, +2 for safety/equipment General ADL Comments: Difficulty with balance with LB ADL. Will approach LB ADL using lateral leans to improve safety. May benefit from tiolet aid and long handled sponge. Improved endurance with ADL.   Mobility Overal bed mobility: Modified Independent Bed Mobility: Supine to Sit Supine to sit: Modified independent (Device/Increase time), HOB elevated Sit to supine: Min assist General bed mobility comments: OOB in chair   Transfers Overall transfer level: Needs assistance Equipment used: Rolling walker (2 wheeled) Transfers: Sit to/from Stand Sit to Stand: Min assist Stand pivot transfers: Min assist General transfer comment: min assist to stabilize RW as pt using it to assist with standing from chair. Cues for hand placement and anterior translation. No physical assist needed from elevated bed. Total of 4 trials   Ambulation / Gait / Stairs / Wheelchair Mobility Ambulation/Gait Ambulation/Gait assistance: Min guard, +2 safety/equipment Ambulation Distance (Feet): 22 Feet Assistive device:  Rolling walker (2 wheeled) Gait Pattern/deviations: Step-to pattern General Gait Details: Pt completed 4 trials of 20' with gait today with seated rest between each. pt fatigued with each trial, chair pulled behind with HR 89-92 and sats 95-98% on RA. cues for posture, hop length and safety. Pt with one LOB end of session when trying to look behind him while standing with 2 person assist to correct.  Gait velocity: decr Gait velocity interpretation: Below normal speed for age/gender   Posture / Balance Balance Overall balance assessment: Needs assistance Sitting-balance support: No upper extremity supported, Feet supported Sitting balance-Leahy Scale: Good Standing balance support: Bilateral upper extremity supported, During functional activity Standing balance-Leahy Scale: Poor Standing balance comment: relies on RW for balance.  Needs RW for static balance as well due to NWB.    Special needs/care consideration  Wound Vac (area) VAC removed 08/03/16 Skin surgical incision to r foot. Stitches and some seroussanguionous drainage. Gauze applied and ace wrap by RN 8/22  Bowel mgmt:continent LBM 08/02/16 Bladder mgmt: continent Diabetic mgmt yes IV antibiotics until 08/15/16. PICC to be placed 08/03/16   Previous Home Environment Living Arrangements: Spouse/significant other, Children (74 yo son, Jeneen Rinks lives with his parents)  Lives With: Spouse, Son Available Help at Discharge: Family, Available PRN/intermittently Type of Home: House Home Layout: One level Home Access: Stairs to enter Entrance Stairs-Rails: None Entrance Stairs-Number of Steps: 2 very small steps Bathroom Shower/Tub: Tub/shower unit, Walk-in shower (has both available but uses the tub shower combo) Bathroom Toilet: Handicapped height Bathroom Accessibility: Yes How Accessible: Accessible via walker [place]: No Additional Comments: [facility] home care arranged already for d/c home needs  from acute hospital  Discharge Living Setting Plans for Discharge Living Setting: Patient's home, Lives with (comment) (spouse and adult son, Jeneen Rinks) Type of Home at Discharge: House Discharge Home Layout: One level Discharge Home Access: Stairs to enter Entrance Stairs-Rails: None Entrance Stairs-Number of Steps: 2 very small steps Discharge Bathroom Shower/Tub: Tub/shower unit, Walk-in shower (has both available but uses tub/shower combo) Discharge Bathroom Toilet: Handicapped height Discharge Bathroom Accessibility: Yes How Accessible: Accessible via walker Does the patient have any problems obtaining your medications?: No  Social/Family/Support Systems Patient Roles: Spouse, Parent (was to begin new job. Laid off in April from [employer] and Re) Contact Information: Cordarious Zeek, wife Anticipated Caregiver: wife and son Anticipated Caregiver's Contact Information: see above Ability/Limitations of Caregiver: wife works days. 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 IV antibioitcs until 9/3 Pt/Family Agrees to Admission and willing to participate: Yes Program Orientation Provided & Reviewed with Pt/Caregiver Including Roles  & Responsibilities: Yes  Decrease burden of Care through IP rehab admission: n/a  Possible need for SNF placement upon discharge: not anticipated  Patient Condition: This patient's medical and functional status has changed since the consult dated: 07/30/2016 in which the Rehabilitation Physician determined and documented that the patient's condition is appropriate for intensive rehabilitative care in an  inpatient rehabilitation facility. See "History of Present Illness" (above) for medical update. Functional changes are: min assist. Patient's medical and functional status update has been discussed with the Rehabilitation physician and patient remains appropriate for inpatient rehabilitation. Will admit to inpatient rehab today.   Preadmission Screen Completed By:  Cleatrice Burke, 08/03/2016 1:19 PM ______________________________________________________________________   Discussed status with Dr. Naaman Plummer on 08/03/2016 at 1242 and received telephone approval for admission today.  Admission Coordinator:  Cleatrice Burke, time 5945 Date 08/03/2016       Cosigned by: Meredith Staggers, MD at 08/03/2016 1:30 PM  Revision History

## 2016-08-03 NOTE — Discharge Summary (Signed)
Physician Discharge Summary       Patient ID: Joshua [name] MRN: [id number] DOB/AGE: 02-20-1956 60 y.o.  Admit date: 07/18/2016 Discharge date: 08/03/2016 Primary Cardiologist:Dr. Claiborne Billings   Discharge Diagnoses:  Principal Problem:   Unstable angina pectoris [facility]) Active Problems:   S/P angioplasty with stent 07/29/16 DES to LAD ostium to prox vessel overlapping previously placed stents; DES 2nd OM   CAD in native artery   Chronic combined systolic and diastolic CHF (congestive heart failure) ([place])   Coronary artery disease   Hyperlipemia   Renal insufficiency   Penetrating wound of right foot   Type 1 diabetes mellitus with right diabetic foot ulcer (HCC)   Chronic osteomyelitis of toe of right foot (HCC)   CKD (chronic kidney disease) stage 3, GFR 30-59 ml/min   Chronic osteomyelitis of right foot (HCC)   Cellulitis and abscess of leg, except foot   Acute on chronic combined systolic and diastolic CHF, NYHA class 3 ([place])   Status post transmetatarsal amputation of right foot [facility])   Physical debility   Discharged Condition: fair  Procedures: cardiac cath and PCI 07/19/16 by Dr. Tamala Julian   Progression of disease and OM1 and proximal LAD. Moderately severe mid to distal circumflex not previously treated because it would jail 2 large obtuse marginals.  Intervention not performed today because of anticipated large contrast load on top of CT with contrast earlier today.  Plan to review digital images with colleagues and decide about multilesion PCI versus CABG.  Cardiac catheterization with PCI 07/29/2016: by Dr. Claiborne Billings  Mid RCA lesion, 50 %stenosed.  Mid RCA to Dist RCA lesion, 10 %stenosed.  RPDA lesion, 30 %stenosed.  Mid Cx lesion, 75 %stenosed.  3rd Mrg lesion, 30 %stenosed.  Ost 1st Diag to 1st Diag lesion, 40 %stenosed.  Dist LAD lesion, 10 %stenosed.  Ost LAD to Prox LAD lesion, 85 %stenosed.  A STENT SYNERGY DES 3.5X20 drug eluting stent was successfully  placed, and overlaps previously placed stent.  Prox LAD to Mid LAD lesion, 85 %stenosed.  Post intervention, there is a 0% residual stenosis.  A STENT SYNERGY DES [id number] drug eluting stent was successfully placed, and does not overlap previously placed stent.  2nd Mrg lesion, 95 %stenosed.  Post intervention, there is a 0% residual stenosis.   Successful stenting of proximal 85% LAD stenosis with a Synergy 3.5 x 20 drug-eluting stent from the LAD ostium to the proximal vessel overlapping previously placed stents. A 0% stenosis is noted post implantation. Final postdilatation diameter of 3.75 mm.  Successful stenting of the second obtuse marginal from 90% to 0% using a Synergy 2.75 x 16 mm DES deployed at 14 atm. Postdilatation was not performed.  Total contrast 95 cc    SURGERY 07/24/16 by Dr. Sharol Given PREOPERATIVE DIAGNOSIS: Abscess osteomyelitis MTP joint right great toe status post amputation of the second and third toes  POSTOPERATIVE DIAGNOSIS: Same.  PROCEDURE: Transmetatarsal amputation Application of Prevena wound VAC  Echo:  45-50%  Study Conclusions  - Left ventricle: The cavity size was normal. Wall thickness was   increased in a pattern of moderate LVH. Systolic function was   mildly reduced. The estimated ejection fraction was in the range   of 45% to 50%. There is akinesis of the mid-apicalanteroseptal   myocardium. Doppler parameters are consistent with abnormal left   ventricular relaxation (grade 1 diastolic dysfunction).  Hospital Course: 60 year old male presented to the emergency room 07/18/16 with a two-week history of some substernal chest discomfort.  The discomfort would usually occur at night,  awaken him from sleep and was usually better if he sits up or walks around some.  He had recurrent episodes evening and afternoon prior to admit with an episode of chest discomfort that was more severe than most radiated somewhat through to his back and up into  his neck.  He took a nitroglycerin and the discomfort improved.  He was advised by the PA to come to the emergency room.  Initial enzymes are negative and EKG is unremarkable.  He was laid off recently and has been inactive and has gained weight.  He denies PND or orthopnea.  He does have some progressive shortness of breath and also has been having peripheral edema.  He has a history of coronary artery disease with stents to the mid and distal right coronary artery and to the LAD in 2012.  He underwent repeat catheterization last October with findings of a 60% circumflex stenosis, 50% in-stent stenosis of the right coronary artery and mild disease elsewhere.  He has known diabetic gastroparesis and has a recurrent ulcer on his Rt foot that he is seen in the wound center for.  He also has reflux.  He was placed on IV NTG and IV heparin and admitted to rule out MI and proceed with cardiac cath.   His troponin pk was 0.24 felt to be unstable angina.   He underwent cath with Dr. Tamala Julian.   Results as above.  Dr. Tamala Julian and Dr. Gwenlyn Found discussed as well.   On 07/20/16 consult with Dr. Servando Snare "After extension discussion of options with patient and review cath films patient prefers to proceed with angioplasty , after he will need more aggressive control of DM, lipids and risk factors. He understands not taking meds is not  option ie  brilinta "  Pt's Rt Gt toe wound bled and had pus -Osteomyelitis abscess right great toe by MRI and consult with Ortho Dr. Sharol Given.  Pt underwent transmetatarsal amputation of rt. Foot on the 12th.  Due to osteo it was felt to hold off on PCI until infection controlled.   ID saw the pt as well.   He did well with surgery and will follow up with Dr. Sharol Given.    per ID: IV Teflaro and would set him up for 3 weeks of IV teflaro starting 07/25/16  --ID will plan on seeing him in followup in ID clinic before he finishes his IV abx.  Pt was able to under go PCI on 07/29/16 with Dr. Claiborne Billings and  tolerated the procedure well -see above.    Post procedure he continued with his chronic systolic HF.  Treated.  He was also seen and managed by IM for his diabetes.    ECG: Tracing from 07/30/2016 shows sinus rhythm with old anterior infarct pattern.  By 08/03/16 he was seen and evaluated by Dr. Meda Coffee and found stable for discharge to inpt rehab with his debility post amputation.    On BB, Brilinta, asa, lipid, no ACE with renal insuff.   He will need Brilinta for a year at least.  We will plan follow up in  4 weeks, appt made.   NEW meds are Brilinta, he had previously stopped, amlodipine, IV antibiotic. reglan, robaxin, higher dose of Imdur, and new dose of toprol.  No scripts sent as pt is going to inpt rehab.  Insulin I am ordering what he is receiving in the hospital.  They will need to send scripts for outpt  as there may be changes.    Dressings per ortho.  Continue PICC line for IV antibiotics.      Consults: ID, rehabilitation medicine, orthopedic surgery and vascular surgery, internal medicine, wound care, PT and OT.   Significant Diagnostic Studies:  BMP Latest Ref Rng & Units 08/03/2016 08/02/2016 08/01/2016  Glucose 65 - 99 mg/dL 264(H) 206(H) 202(H)  BUN 6 - 20 mg/dL '20 14 14  ' Creatinine 0.61 - 1.24 mg/dL 1.93(H) 1.71(H) 1.70(H)  Sodium 135 - 145 mmol/L 135 137 137  Potassium 3.5 - 5.1 mmol/L 4.1 3.7 3.7  Chloride 101 - 111 mmol/L 104 104 103  CO2 22 - 32 mmol/L '26 26 24  ' Calcium 8.9 - 10.3 mg/dL 8.5(L) 8.6(L) 8.4(L)   CBC Latest Ref Rng & Units 08/03/2016 08/02/2016 08/01/2016  WBC 4.0 - 10.5 K/uL 10.5 10.2 9.4  Hemoglobin 13.0 - 17.0 g/dL 9.2(L) 9.3(L) 8.8(L)  Hematocrit 39.0 - 52.0 % 29.9(L) 30.5(L) 28.3(L)  Platelets 150 - 400 K/uL 320 311 283   Troponin pk 0.24  Unstable angina  TSH 3.175  HgB A1C  9.5  C-reactive Protein 28.9   HIV antibody is neg.   Sed rate 131. Day of amputation.    CHEST  2 VIEW COMPARISON:  06/23/2016 FINDINGS: Cardiac shadow is  at the upper limits of normal in size. Multiple calcified granulomas are noted consistent with prior granulomatous disease. No focal confluent infiltrate is seen. Mild bibasilar scarring is again noted. Degenerative change of the thoracic spine is seen. IMPRESSION: No acute abnormality noted.  CT of Chest:  07/18/16 IMPRESSION: Although the study is somewhat limited by bolus timing. No pulmonary embolus is seen.  Calcified mediastinal and hilar lymph nodes could be due to old granulomatous disease or sarcoid. There is mild reticulation of pulmonary parenchyma best seen in the right middle lobe which may be due to early pulmonary changes of sarcoidosis.  Cardiomegaly.  Calcific aortic and coronary atherosclerosis.   MRI of Rt foot 07/23/16 IMPRESSION: Findings consistent with osteomyelitis throughout the distal phalanx of the great toe with an abscess in the plantar soft tissues at the level of the distal phalanx.  Mild marrow edema in the head of the proximal phalanx of the great toe may be reactive or could reflect small focus of osteomyelitis.  Soft tissue edema throughout the dorsum of the foot consistent with cellulitis or dependent change.  First MTP osteoarthritis.  Status post amputation of the second and third toes and distal third metatarsal.  07/20/16 carotid and lower ext arterial dopplers; Summary: Findings are consistent with a 1-39 percent stenosis involving the right internal carotid artery and the left internal carotid artery. Vertebral arteries demonstrate antegrade flow. Right ABI is suggestive of mild arterial disease at rest and the left is within normal limits at rest but may be falsely elevated due to medial Calcification  Discharge Exam: Blood pressure 136/70, pulse 65, temperature 98.2 F (36.8 C), temperature source Oral, resp. rate 18, height '5\' 9"'  (1.753 m), weight 298 lb 4.5 oz (135.3 kg), SpO2 98 %.  Disposition: 01-Home or Self  Care  Discharge Instructions    Amb Referral to Cardiac Rehabilitation    Complete by:  As directed   Diagnosis:   NSTEMI PTCA Coronary Stents         Medication List    STOP taking these medications   amLODipine-olmesartan 10-40 MG tablet Commonly known as:  AZOR   furosemide 40 MG tablet Commonly known as:  LASIX  NOVOLIN R RELION 100 units/mL injection Generic drug:  insulin regular     TAKE these medications   acetaminophen 325 MG tablet Commonly known as:  TYLENOL Take 2 tablets (650 mg total) by mouth every 4 (four) hours as needed for headache or mild pain.   allopurinol 300 MG tablet Commonly known as:  ZYLOPRIM Take 300 mg by mouth daily.   amLODipine 10 MG tablet Commonly known as:  NORVASC Take 1 tablet (10 mg total) by mouth daily.   aspirin EC 81 MG tablet Take 81 mg by mouth daily.   ceftaroline 600 mg in sodium chloride 0.9 % 250 mL Inject 600 mg into the vein every 12 (twelve) hours. Stop date 08/15/16   doxazosin 8 MG tablet Commonly known as:  CARDURA Take 8 mg by mouth daily.   feeding supplement (GLUCERNA SHAKE) Liqd Take 237 mLs by mouth 2 (two) times daily between meals.   hydrALAZINE 25 MG tablet Commonly known as:  APRESOLINE Take 1.5 tablets (37.5 mg total) by mouth 2 (two) times daily. What changed:  how much to take   insulin aspart 100 UNIT/ML injection Commonly known as:  novoLOG Inject 0-20 Units into the skin 3 (three) times daily with meals.   insulin NPH Human 100 UNIT/ML injection Commonly known as:  HUMULIN N,NOVOLIN N Inject 0.2 mLs (20 Units total) into the skin daily before breakfast. What changed:  how much to take  when to take this  additional instructions   insulin NPH Human 100 UNIT/ML injection Commonly known as:  HUMULIN N,NOVOLIN N Inject 0.22 mLs (22 Units total) into the skin at bedtime. What changed:  You were already taking a medication with the same name, and this prescription was added. Make  sure you understand how and when to take each.   isosorbide mononitrate 30 MG 24 hr tablet Commonly known as:  IMDUR Take 3 tablets (90 mg total) by mouth daily. What changed:  medication strength  how much to take   methocarbamol 500 MG tablet Commonly known as:  ROBAXIN Take 1 tablet (500 mg total) by mouth every 6 (six) hours as needed for muscle spasms.   metoCLOPramide 5 MG tablet Commonly known as:  REGLAN Take 1-2 tablets (5-10 mg total) by mouth every 8 (eight) hours as needed for nausea (if ondansetron (ZOFRAN) ineffective.).   metoprolol succinate 25 MG 24 hr tablet Commonly known as:  TOPROL-XL Take 3 tablets (75 mg total) by mouth daily. What changed:  medication strength  how much to take  additional instructions   nitroGLYCERIN 0.4 MG SL tablet Commonly known as:  NITROSTAT Place 1 tablet (0.4 mg total) under the tongue every 5 (five) minutes as needed for chest pain.   ondansetron 4 MG tablet Commonly known as:  ZOFRAN Take 1 tablet (4 mg total) by mouth every 6 (six) hours as needed for nausea.   oxyCODONE-acetaminophen 5-325 MG tablet Commonly known as:  PERCOCET/ROXICET Take 1-2 tablets by mouth every 4 (four) hours as needed for moderate pain.   pantoprazole 40 MG tablet Commonly known as:  PROTONIX Take 1 tablet (40 mg total) by mouth daily.   potassium chloride SA 20 MEQ tablet Commonly known as:  K-DUR,KLOR-CON Take 1 tablet (20 mEq total) by mouth daily.   rosuvastatin 40 MG tablet Commonly known as:  CRESTOR Take 1 tablet (40 mg total) by mouth daily.   sodium chloride flush 0.9 % Soln Commonly known as:  NS 10-40 mLs by Intracatheter route as  needed (flush).   sodium chloride flush 0.9 % Soln Commonly known as:  NS Inject 3 mLs into the vein every 12 (twelve) hours.   sodium chloride flush 0.9 % Soln Commonly known as:  NS Inject 3 mLs into the vein as needed.   ticagrelor 90 MG Tabs tablet Commonly known as:  BRILINTA Take  1 tablet (90 mg total) by mouth 2 (two) times daily. What changed:  Another medication with the same name was removed. Continue taking this medication, and follow the directions you see here.      Follow-up Information    DUDA,MARCUS V, MD Follow up in 2 week(s).   Specialty:  Orthopedic Surgery Contact information: [place] [address] [phone]        Murray Hodgkins, NP Follow up on 08/31/2016.   Specialties:  Nurse Practitioner, Cardiology, Radiology Why:  at 9:30 AM with Dr. Evette Georges PA.  Contact information: [address] [place] [address] [phone]            Discharge Instructions: Call [facility] at [phone] if any bleeding, swelling or drainage at cath site.  May shower, no tub baths for 48 hours for groin sticks.   Do Not stop Brilinta and Asprin.  You have new stents.    Heart Healthy Diabetic Diet.  You need to see infectious disease doctor before IV antibiotics are stopped.    Call Dr. Evette Georges office when you are discharged from rehab.  Call if any questions.   Signed: Cecilie Kicks Nurse Practitioner-Certified [facility]: [facility] 08/03/2016, 5:17 PM  Time spent on discharge : > 40 minutes.

## 2016-08-03 NOTE — PMR Pre-admission (Signed)
PMR Admission Coordinator Pre-Admission Assessment  Patient: Joshua [name] is an 60 y.o., male MRN: [id number] DOB: June 06, 1956 Height: 5\' 9"  (175.3 cm) Weight: 135.3 kg (298 lb 4.5 oz)              Insurance Information HMO:     PPO: yes     PCP:      IPA:      80/20:      OTHER:  PRIMARY: [employer] [place]      Policy#: [id number]      Subscriber: pt CM Name: Joshua [name]      Phone#: [phone] ext [phone]     Fax#: [phone] Pre-Cert#: [id number]      Employer: unemployed. Approved 8/22 until 8/30 when updates are due Benefits:  Phone #: [phone]     Name: 08/02/16 Eff. Date: 04/12/2016     Deduct: $1000      Out of Pocket Max: $2350      Life Max: none CIR: 90%      SNF: 90% 60 days Outpatient: 90%     Co-Pay:  30 visits combined Home Health: 90%      Co-Pay: 10% DME: 90%     Co-Pay: 10% Providers: in network  SECONDARY: none       Medicaid Application Date:       Case Manager:  Disability Application Date:       Case Worker:   Emergency Contact Information Contact Information    Name Relation Home Work [place] Spouse   [phone]   [name],Joshua Son   [phone]     Current Medical History  Patient Admitting Diagnosis: PAD, right transmetatarsal amputation  History of Present Illness: Joshua [name] a 60 y.o.malewith history of CAD, T2DM with gastroparesis,Chronic renal insufficiency with creatinine 1.60-1.78, chronic diabetic foot ulcer and polyneuropathy, morbid obesity, pulmonary sarcoidosis.  Admitted on 07/18/16 with unstable angina and ACS. He was placed on IV heparin/IV NTG and cardiac cath revealed 3 vessel disease with normal LVF. Dr. Servando [name] recommended CABG but patient declined and elected PCI. He was found to have abscess in plantar soft tissue at distal phalanx with osteomyelitis of right great toe. Dr. Tommy [name] consulted and recommended amputation . PCI deferred till infection under control and he underwent Right great toe trans metartarsal  amputation on 08/12 by Dr. Sharol [name]. Placed on intravenous TEFLARO 07/25/2016 3 weeks. Prevena VAC placed and patient to be NWB RLE. VAC removed 08/03/16.He was started on IV antibiotics and 2 weeks recommended for treatment per ID. Subcutaneous heparin for DVT prophylaxis. He was hydrated for management of acute renal failure and underwent cardiac cath with PCI 07/28/2016 and maintained on aspirin as well as Brilinta.   Past Medical History  Past Medical History:  Diagnosis Date  . Chronic combined systolic and diastolic CHF (congestive heart failure) ([place])    a. 07/2015 Echo: EF 45-50%, mod LVH, mid apical/antsept AK, Gr 1 DD.  [name] Coronary artery disease    a. 2012 s/p MI/PCI: s/p DES to mid/dist RCA and overlapping DES to LAD;  b. 07/2015 Cath: LAD 10% ISR, D1 40(jailed), LCX 70m, OM2 30, OM3 30, RCA 22m ISR, 10d ISR.  . Diabetic gastroparesis ([place])   . Essential hypertension   . GERD (gastroesophageal reflux disease)   . Gout   . Heart murmur   . Hyperlipemia   . Morbid obesity ([place])   . Myocardial infarction ([place]) 2012  . OSA on CPAP   . Polyneuropathy in diabetes(357.2)   .  Pulmonary sarcoidosis ([place])    "problems w/it years ago; no problems anymore" (07/03/2014)  . Type II diabetes mellitus (HCC)     Family History  family history includes Diabetes in his maternal grandmother; Heart attack in his maternal aunt; Hypertension in his maternal grandmother.  Prior Rehab/Hospitalizations:  Has the patient had major surgery during 100 days prior to admission? No  Current Medications   Current Facility-Administered Medications:  .  0.9 %  sodium chloride infusion, , Intravenous, Continuous, Joshua Minion, MD, Last Rate: 10 mL/hr at 07/31/16 1239 .  0.9 %  sodium chloride infusion, 250 mL, Intravenous, PRN, Joshua Crome, MD .  [DISCONTINUED] acetaminophen (TYLENOL) tablet 650 mg, 650 mg, Oral, Q6H PRN **OR** acetaminophen (TYLENOL) suppository 650 mg, 650 mg, Rectal, Q6H PRN, Joshua Minion, MD .  acetaminophen (TYLENOL) tablet 650 mg, 650 mg, Oral, Q4H PRN, Joshua Crome, MD, 650 mg at 07/30/16 1737 .  allopurinol (ZYLOPRIM) tablet 300 mg, 300 mg, Oral, Daily, Joshua Reedy, MD, 300 mg at 08/03/16 1000 .  amLODipine (NORVASC) tablet 10 mg, 10 mg, Oral, Daily, 10 mg at 08/03/16 1002 **AND** [DISCONTINUED] irbesartan (AVAPRO) tablet 300 mg, 300 mg, Oral, Daily, Joshua Sine, MD, 300 mg at 07/19/16 1019 .  antiseptic oral rinse (CPC / CETYLPYRIDINIUM CHLORIDE 0.05%) solution 7 mL, 7 mL, Mouth Rinse, BID, Joshua Sine, MD, 7 mL at 08/01/16 2200 .  aspirin chewable tablet 81 mg, 81 mg, Oral, Daily, Joshua Crome, MD, 81 mg at 08/03/16 1002 .  ceftaroline (TEFLARO) 600 mg in sodium chloride 0.9 % 250 mL IVPB, 600 mg, Intravenous, Q12H, Joshua Hayward, MD, 600 mg at 08/03/16 1000 .  doxazosin (CARDURA) tablet 8 mg, 8 mg, Oral, Daily, Joshua Reedy, MD, 8 mg at 08/03/16 1000 .  feeding supplement (GLUCERNA SHAKE) (GLUCERNA SHAKE) liquid 237 mL, 237 mL, Oral, BID BM, Joshua Sine, MD, 237 mL at 08/01/16 1400 .  heparin injection 5,000 Units, 5,000 Units, Subcutaneous, Q8H, Joshua Crome, MD, 5,000 Units at 08/02/16 2157 .  hydrALAZINE (APRESOLINE) tablet 37.5 mg, 37.5 mg, Oral, BID, Joshua Reedy, MD, 37.5 mg at 08/03/16 1002 .  HYDROmorphone (DILAUDID) injection 1 mg, 1 mg, Intravenous, Q2H PRN, Joshua Minion, MD, 1 mg at 07/26/16 2002 .  insulin aspart (novoLOG) injection 0-20 Units, 0-20 Units, Subcutaneous, TID WC, Joshua Reedy, MD, 7 Units at 08/02/16 1721 .  insulin NPH Human (HUMULIN N,NOVOLIN N) injection 20 Units, 20 Units, Subcutaneous, QAC breakfast, 20 Units at 08/03/16 1005 **AND** insulin NPH Human (HUMULIN N,NOVOLIN N) injection 22 Units, 22 Units, Subcutaneous, QHS, Joshua Marry Guan, MD, 22 Units at 08/02/16 2158 .  isosorbide mononitrate (IMDUR) 24 hr tablet 90 mg, 90 mg, Oral, Daily, Joshua Sine, MD, 90 mg at 08/03/16 1001 .  methocarbamol  (ROBAXIN) tablet 500 mg, 500 mg, Oral, Q6H PRN, 500 mg at 07/29/16 2243 **OR** methocarbamol (ROBAXIN) 500 mg in dextrose 5 % 50 mL IVPB, 500 mg, Intravenous, Q6H PRN, Joshua Minion, MD .  metoCLOPramide (REGLAN) tablet 5-10 mg, 5-10 mg, Oral, Q8H PRN **OR** metoCLOPramide (REGLAN) injection 5-10 mg, 5-10 mg, Intravenous, Q8H PRN, Joshua Minion, MD .  metoprolol succinate (TOPROL-XL) 24 hr tablet 75 mg, 75 mg, Oral, Daily, Joshua Sine, MD, 75 mg at 08/03/16 1001 .  nitroGLYCERIN (NITROSTAT) SL tablet 0.4 mg, 0.4 mg, Sublingual, Q5 Min x 3 PRN, Joshua Reedy, MD .  ondansetron [facility]) injection  4 mg, 4 mg, Intravenous, Q6H PRN, Joshua Crome, MD, 4 mg at 07/29/16 1747 .  ondansetron (ZOFRAN) tablet 4 mg, 4 mg, Oral, Q6H PRN **OR** [DISCONTINUED] ondansetron (ZOFRAN) injection 4 mg, 4 mg, Intravenous, Q6H PRN, Joshua Minion, MD .  oxyCODONE-acetaminophen (PERCOCET/ROXICET) 5-325 MG per tablet 1-2 tablet, 1-2 tablet, Oral, Q4H PRN, Joshua Crome, MD, 2 tablet at 08/02/16 2342 .  pantoprazole (PROTONIX) EC tablet 40 mg, 40 mg, Oral, Daily, Joshua Reedy, MD, 40 mg at 08/03/16 1001 .  potassium chloride SA (K-DUR,KLOR-CON) CR tablet 20 mEq, 20 mEq, Oral, Daily, Joshua Marry Guan, MD, 20 mEq at 08/03/16 1002 .  rosuvastatin (CRESTOR) tablet 40 mg, 40 mg, Oral, q1800, Ihor Austin, RPH, 40 mg at 08/02/16 1721 .  sodium chloride flush (NS) 0.9 % injection 3 mL, 3 mL, Intravenous, Q12H, Joshua Crome, MD, 3 mL at 08/02/16 2203 .  sodium chloride flush (NS) 0.9 % injection 3 mL, 3 mL, Intravenous, PRN, Joshua Crome, MD .  ticagrelor [facility]) tablet 90 mg, 90 mg, Oral, BID, Joshua Crome, MD, 90 mg at 08/03/16 1002  Patients Current Diet: Diet heart healthy/carb modified Room service appropriate? Yes; Fluid consistency: Thin  Precautions / Restrictions Precautions Precautions: Fall Precaution Comments: Patient with DOE awaiting PCI.  New transmetatarsal amputation. Stints scheduled  8/17; VAC on right foot Restrictions Weight Bearing Restrictions: Yes RUE Weight Bearing: Weight bearing as tolerated RLE Weight Bearing: Non weight bearing   Has the patient had 2 or more falls or a fall with injury in the past year?No  Prior Activity Level Community (5-7x/wk): driving and Independent pta. Laid off in April and was to start a new job. 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 crutches  Prior Functional Level Prior Function Level of Independence: Independent Comments: Drives.  Recently laid off.  Self Care: Did the patient need help bathing, dressing, using the toilet or eating?  Independent  Indoor Mobility: Did the patient need assistance with walking from room to room (with or without device)? Independent  Stairs: Did the patient need assistance with internal or external stairs (with or without device)? Independent  Functional Cognition: Did the patient need help planning regular tasks such as shopping or remembering to take medications? Independent  Current Functional Level Cognition  Overall Cognitive Status: Within Functional Limits for tasks assessed Orientation Level: Oriented X4    Extremity Assessment (includes Sensation/Coordination)  Upper Extremity Assessment:  (B shoulder issues but functional)  Lower Extremity Assessment: RLE deficits/detail (s/p surgery) RLE Deficits / Details: Transmetatarsal amputation with VAC in place.  Edema noted. RLE Sensation: history of peripheral neuropathy LLE Deficits / Details: Noted edema LLE Sensation: history of peripheral neuropathy    ADLs  Overall ADL's : Needs assistance/impaired Grooming: Sitting, Set up Upper Body Bathing: Set up, Sitting Lower Body Bathing: Minimal assistance, Sitting/lateral leans Upper Body  Dressing : Set up, Sitting Lower Body Dressing: Minimal assistance, Sit to/from stand Toilet Transfer: Minimal assistance, +2 for safety/equipment, Ambulation, +2 for physical assistance Toileting- Clothing Manipulation and Hygiene: Minimal assistance Functional mobility during ADLs: Minimal assistance, +2 for safety/equipment General ADL Comments: Difficulty with balance with LB ADL. Will approach LB ADL using lateral leans to improve safety. May benefit from tiolet aid and long handled sponge. Improved endurance with ADL.    Mobility  Overal  bed mobility: Modified Independent Bed Mobility: Supine to Sit Supine to sit: Modified independent (Device/Increase time), HOB elevated Sit to supine: Min assist General bed mobility comments: OOB in chair    Transfers  Overall transfer level: Needs assistance Equipment used: Rolling walker (2 wheeled) Transfers: Sit to/from Stand Sit to Stand: Min assist Stand pivot transfers: Min assist General transfer comment: min assist to stabilize RW as pt using it to assist with standing from chair. Cues for hand placement and anterior translation. No physical assist needed from elevated bed. Total of 4 trials    Ambulation / Gait / Stairs / Wheelchair Mobility  Ambulation/Gait Ambulation/Gait assistance: Min guard, +2 safety/equipment Ambulation Distance (Feet): 22 Feet Assistive device: Rolling walker (2 wheeled) Gait Pattern/deviations: Step-to pattern General Gait Details: Pt completed 4 trials of 20' with gait today with seated rest between each. pt fatigued with each trial, chair pulled behind with HR 89-92 and sats 95-98% on RA. cues for posture, hop length and safety. Pt with one LOB end of session when trying to look behind him while standing with 2 person assist to correct.  Gait velocity: decr Gait velocity interpretation: Below normal speed for age/gender    Posture / Balance Balance Overall balance assessment: Needs  assistance Sitting-balance support: No upper extremity supported, Feet supported Sitting balance-Leahy Scale: Good Standing balance support: Bilateral upper extremity supported, During functional activity Standing balance-Leahy Scale: Poor Standing balance comment: relies on RW for balance.  Needs RW for static balance as well due to NWB.     Special needs/care consideration  Wound Vac (area) VAC removed 08/03/16 Skin surgical incision to r foot. Stitches and some seroussanguionous drainage. Gauze applied and ace wrap by RN 8/22                            Bowel mgmt:continent LBM 08/02/16 Bladder mgmt: continent Diabetic mgmt yes IV antibiotics until 08/15/16. PICC to be placed 08/03/16   Previous Home Environment Living Arrangements: Spouse/significant other, Children (31 yo son, Jeneen Rinks lives with his parents)  Lives With: Spouse, Son Available Help at Discharge: Family, Available PRN/intermittently Type of Home: House Home Layout: One level Home Access: Stairs to enter Entrance Stairs-Rails: None Entrance Stairs-Number of Steps: 2 very small steps Bathroom Shower/Tub: Tub/shower unit, Walk-in shower (has both available but uses the tub shower combo) Bathroom Toilet: Handicapped height Bathroom Accessibility: Yes How Accessible: Accessible via walker [place]: No Additional Comments: [facility] home care arranged already for d/c home needs from acute hospital  Discharge Living Setting Plans for Discharge Living Setting: Patient's home, Lives with (comment) (spouse and adult son, Jeneen Rinks) Type of Home at Discharge: House Discharge Home Layout: One level Discharge Home Access: Stairs to enter Entrance Stairs-Rails: None Entrance Stairs-Number of Steps: 2 very small steps Discharge Bathroom Shower/Tub: Tub/shower unit, Walk-in shower (has both available but uses tub/shower combo) Discharge Bathroom Toilet: Handicapped height Discharge Bathroom Accessibility: Yes How Accessible:  Accessible via walker Does the patient have any problems obtaining your medications?: No  Social/Family/Support Systems Patient Roles: Spouse, Parent (was to begin new job. Laid off in April from [employer] and Re) Contact Information: Rudolpho Claxton, wife Anticipated Caregiver: wife and son Anticipated Caregiver's Contact Information: see above Ability/Limitations of Caregiver: wife works days. Son Ruthy Dick when wife works Building control surveyor Availability: 24/7 Discharge Plan Discussed with Primary Caregiver: Yes Is Caregiver In Agreement with Plan?: Yes Does Caregiver/Family have Issues with Lodging/Transportation while Pt is in  Rehab?: No  Goals/Additional Needs Patient/Family Goal for Rehab: Mod I wiht PT and OT Expected length of stay: [facility] 7 days Equipment Needs: PICC to be placed 8/22. IV antibioitcs until 9/3 Pt/Family Agrees to Admission and willing to participate: Yes Program Orientation Provided & Reviewed with Pt/Caregiver Including Roles  & Responsibilities: Yes  Decrease burden of Care through IP rehab admission: n/a  Possible need for SNF placement upon discharge: not anticipated  Patient Condition: This patient's medical and functional status has changed since the consult dated: 07/30/2016 in which the Rehabilitation Physician determined and documented that the patient's condition is appropriate for intensive rehabilitative care in an inpatient rehabilitation facility. See "History of Present Illness" (above) for medical update. Functional changes are: min assist. Patient's medical and functional status update has been discussed with the Rehabilitation physician and patient remains appropriate for inpatient rehabilitation. Will admit to inpatient rehab today.   Preadmission Screen Completed By:  Cleatrice Burke, 08/03/2016 1:19 PM ______________________________________________________________________   Discussed status with Dr. Naaman Plummer on 08/03/2016 at 1242 and received  telephone approval for admission today.  Admission Coordinator:  Cleatrice Burke, time 1694 Date 08/03/2016

## 2016-08-03 NOTE — Discharge Instructions (Signed)
Call [facility] at [phone] if any bleeding, swelling or drainage at cath site.  May shower, no tub baths for 48 hours for groin sticks.   Do Not stop Brilinta and Asprin.  You have new stents.    Heart Healthy Diabetic Diet.  You need to see infectious disease doctor before IV antibiotics are stopped.    Call Dr. Evette Georges office when you are discharged from rehab.  Call if any questions.

## 2016-08-03 NOTE — Progress Notes (Signed)
Primary cardiologist: Dr. [name]  Seen for followup: CAD  Subjective:    Denies CP, improved breathing, no SOB, non-mobile sec to his foot wound.  Objective:   Temp:  [97.7 F (36.5 C)-98.3 F (36.8 C)] 97.7 F (36.5 C) (08/22 0501) Pulse Rate:  [68-92] 70 (08/22 1000) Resp:  [16-19] 17 (08/22 0501) BP: (117-156)/(51-79) 156/79 (08/22 1000) SpO2:  [95 %-98 %] 98 % (08/22 0501) Weight:  [298 lb 4.5 oz (135.3 kg)] 298 lb 4.5 oz (135.3 kg) (08/22 0501) Last BM Date: 08/02/16  Filed Weights   08/01/16 0429 08/02/16 0341 08/03/16 0501  Weight: (!) 305 lb 12.5 oz (138.7 kg) (!) 301 lb 13 oz (136.9 kg) 298 lb 4.5 oz (135.3 kg)    Intake/Output Summary (Last 24 hours) at 08/03/16 1103 Last data filed at 08/03/16 1000  Gross per 24 hour  Intake             1004 ml  Output             3326 ml  Net            -2322 ml    Telemetry: Sinus rhythm.  Exam:  General: Obese male, appears comfortable.  Lungs: There are, nonlabored.  Cardiac: RRR without gallop.  Abdomen: NABS.  Extremities: Status post right TM amputation.  Lab Results:  Basic Metabolic Panel:  Recent Labs Lab 08/01/16 0301 08/02/16 0510 08/03/16 0242  NA 137 137 135  K 3.7 3.7 4.1  CL 103 104 104  CO2 '24 26 26  ' GLUCOSE 202* 206* 264*  BUN '14 14 20  ' CREATININE 1.70* 1.71* 1.93*  CALCIUM 8.4* 8.6* 8.5*    CBC:  Recent Labs Lab 08/01/16 0301 08/02/16 0510 08/03/16 0242  WBC 9.4 10.2 10.5  HGB 8.8* 9.3* 9.2*  HCT 28.3* 30.5* 29.9*  MCV 87.1 87.4 87.2  PLT 283 311 320    Cardiac catheterization with PCI 8/70/2017:  Mid RCA lesion, 50 %stenosed.  Mid RCA to Dist RCA lesion, 10 %stenosed.  RPDA lesion, 30 %stenosed.  Mid Cx lesion, 75 %stenosed.  3rd Mrg lesion, 30 %stenosed.  Ost 1st Diag to 1st Diag lesion, 40 %stenosed.  Dist LAD lesion, 10 %stenosed.  Ost LAD to Prox LAD lesion, 85 %stenosed.  A STENT SYNERGY DES 3.5X20 drug eluting stent was successfully  placed, and overlaps previously placed stent.  Prox LAD to Mid LAD lesion, 85 %stenosed.  Post intervention, there is a 0% residual stenosis.  A STENT SYNERGY DES [id number] drug eluting stent was successfully placed, and does not overlap previously placed stent.  2nd Mrg lesion, 95 %stenosed.  Post intervention, there is a 0% residual stenosis.    Successful stenting of proximal 85% LAD stenosis with a Synergy 3.5 x 20 drug-eluting stent from the LAD ostium to the proximal vessel overlapping previously placed stents. A 0% stenosis is noted post implantation. Final postdilatation diameter of 3.75 mm.  Successful stenting of the second obtuse marginal from 90% to 0% using a Synergy 2.75 x 16 mm DES deployed at 14 atm. Postdilatation was not performed.  Total contrast 95 cc  ECG: Tracing from 07/30/2016 shows sinus rhythm with old anterior infarct pattern.   Medications:    . allopurinol  300 mg Oral Daily  . amLODipine  10 mg Oral Daily  . antiseptic oral rinse  7 mL Mouth Rinse BID  . aspirin  81 mg Oral Daily  . ceFTAROline (TEFLARO) IV  600 mg Intravenous  Q12H  . doxazosin  8 mg Oral Daily  . feeding supplement (GLUCERNA SHAKE)  237 mL Oral BID BM  . heparin  5,000 Units Subcutaneous Q8H  . hydrALAZINE  37.5 mg Oral BID  . insulin aspart  0-20 Units Subcutaneous TID WC  . insulin NPH Human  22 Units Subcutaneous QHS   And  . insulin NPH Human  20 Units Subcutaneous QAC breakfast  . isosorbide mononitrate  90 mg Oral Daily  . metoprolol succinate  75 mg Oral Daily  . pantoprazole  40 mg Oral Daily  . potassium chloride  20 mEq Oral Daily  . rosuvastatin  40 mg Oral q1800  . sodium chloride flush  3 mL Intravenous Q12H  . ticagrelor  90 mg Oral BID   . sodium chloride 10 mL/hr at 07/31/16 1239   PRN Medications: sodium chloride, [DISCONTINUED] acetaminophen **OR** acetaminophen, acetaminophen, HYDROmorphone (DILAUDID) injection, methocarbamol **OR** methocarbamol (ROBAXIN)   IV, metoCLOPramide **OR** metoCLOPramide (REGLAN) injection, nitroGLYCERIN, ondansetron (ZOFRAN) IV, ondansetron **OR** [DISCONTINUED] ondansetron (ZOFRAN) IV, oxyCODONE-acetaminophen, sodium chloride flush   Assessment:   1. Multivessel CAD status post DES to the LAD and OM 2 on August 17.  2. Acute on Chronic combined heart failure with LVEF 45-50%. Negative fluid balance, overnight - 2.5 L, stable crea at 1.7  3. CKD stage 3, creatinine 1.7.   4. Osteomyelitis status post right TMA amputation with plan for 3 weeks of Teflaro per ID.   Plan/Discussion:    The patient was approved for the inpatient rehab. Long-term antibiotics recommended per ID team for treatment of osteomyelitis. He remains stable on current cardiac regimen. Diuresing well, no symptoms, Continue the same medication regimen.  Stable Hb.  Crea slightly elevated from baseline, however no lasix on board and negative fluid balance. Encouraged to mildly increase fluid intake.   He is stable for a transfer to the inpatient rehab. We wil arrange for a follow up in our clinic in 2-3 weeks.   Ena Dawley, M.D., F.A.C.C.

## 2016-08-03 NOTE — Care Management Note (Signed)
Case Management Note   Patient Details  Name: TARIK TEIXEIRA MRN: [id number] Date of Birth: 07-13-1956  Subjective/Objective:     See previous CM note               Action/Plan: Pt to d/c to CIR - have confirmed with Pamala Hurry with CIR that pt has a bed for today and insurance approval.   Expected Discharge Date: 08/03/16             Expected Discharge Plan:  [place]  In-House Referral:  Clinical Social Work  Discharge planning Services  CM Consult, Medication Assistance  Post Acute Care Choice:    Choice offered to:     DME Arranged:    DME Agency:     HH Arranged:    [place] Agency:     Status of Service:  Completed, signed off  If discussed at [employer], dates discussed:    Additional Comments:  Dawayne Patricia, RN 08/03/2016, 5:15 PM

## 2016-08-03 NOTE — H&P (Signed)
Physical Medicine and Rehabilitation Admission H&P    Chief Complaint  Patient presents with  . Chest Pain  : HPI: Joshua [name] is a 60 y.o. male with history of CAD, T2DM with gastroparesis,Chronic renal insufficiency with creatinine 1.60-1.78, chronic diabetic foot ulcer and polyneuropathy, morbid obesity, pulmonary sarcoidosis. Per chart review patient lives with wife and 3 adult children. Wife works during the day. One level home with one-step entry. Admitted on 07/18/16 with unstable angina and ACS. He was placed on IV heparin/IV NTG and cardiac cath revealed 3 vessel disease with normal LVF.  Dr. Servando Snare recommended CABG but patient declined and elected PCI.  He was found to have abscess in plantar soft tissue at distal phalanx with osteomyelitis of right great toe. Dr. Tommy Medal consulted and recommended amputation .  PCI deferred till infection under control and he underwent  Right great toe trans metartarsal amputation on 08/12 by Dr. Sharol Given. Placed on intravenous TEFLARO 07/25/2016 3 weeks. Prevena VAC placed and patient to be NWB RLE. He was started on IV antibiotics and 2 weeks recommended for treatment per ID.Wound VAC was later discontinued 08/03/2016. Subcutaneous heparin for DVT prophylaxis.  He was hydrated for management of acute renal failure and underwent cardiac cath with PCI 07/28/2016 and maintained on aspirin as well as Brilinta. PT/OT evaluations done and CIR recommended for follow up therapy. Patient was admitted for a comprehensive rehabilitation program  ROS HENT: Negative for hearing loss.   Eyes: Negative for blurred vision and double vision.  Respiratory: Negative for cough and shortness of breath.   Cardiovascular: Negative for chest pain and palpitations.  Gastrointestinal: Negative for constipation, diarrhea, heartburn and nausea.  Genitourinary: Negative for dysuria and urgency.  Musculoskeletal: Positive for back pain (lower back) and joint pain. Negative  for myalgias and neck pain.  Skin: Negative for itching and rash.  Neurological: Positive for weakness. Negative for dizziness, tingling, speech change, focal weakness and headaches.  Psychiatric/Behavioral: Negative for depression and memory loss. The patient does not have insomnia   Past Medical History:  Diagnosis Date  . Chronic combined systolic and diastolic CHF (congestive heart failure) ([place])    a. 07/2015 Echo: EF 45-50%, mod LVH, mid apical/antsept AK, Gr 1 DD.  [name] Coronary artery disease    a. 2012 s/p MI/PCI: s/p DES to mid/dist RCA and overlapping DES to LAD;  b. 07/2015 Cath: LAD 10% ISR, D1 40(jailed), LCX 96m OM2 30, OM3 30, RCA 527mSR, 10d ISR.  . Diabetic gastroparesis ([facility]  . Essential hypertension   . GERD (gastroesophageal reflux disease)   . Gout   . Heart murmur   . Hyperlipemia   . Morbid obesity ([facility]  . Myocardial infarction ([facility]2012  . OSA on CPAP   . Polyneuropathy in diabetes(357.2)   . Pulmonary sarcoidosis ([facility]   "problems w/it years ago; no problems anymore" (07/03/2014)  . Status post transmetatarsal amputation of right foot (HC[facility] [place]8/22/2017  . Type II diabetes mellitus ([facility]   Past Surgical History:  Procedure Laterality Date  . AMPUTATION Right 07/24/2016   Procedure: TRANSMETATARSAL FOOT AMPUTATION;  Surgeon: MaNewt MinionMD;  [place] [facility] [place] Service: [facility]ics;  Laterality: Right;  . BREAST LUMPECTOMY Right ~ 2000   "benign"  . CARDIAC CATHETERIZATION N/A 07/30/2015   Procedure: Left Heart Cath and Coronary Angiography;  Surgeon: ChBurnell BlanksMD;  [facility];  Service: Cardiovascular;  Laterality: N/A;  . CARDIAC  CATHETERIZATION N/A 07/19/2016   Procedure: Left Heart Cath and Coronary Angiography;  Surgeon: Belva Crome, MD;  Location: [place] CV LAB;  Service: Cardiovascular;  Laterality: N/A;  . CARDIAC CATHETERIZATION N/A 07/29/2016   Procedure: Coronary Stent Intervention;  Surgeon: Belva Crome,  MD;  Location: [place] CV LAB;  Service: Cardiovascular;  Laterality: N/A;  . CARDIAC CATHETERIZATION N/A 07/29/2016   Procedure: Intravascular Ultrasound/IVUS;  Surgeon: Belva Crome, MD;  Location: [place] CV LAB;  Service: Cardiovascular;  Laterality: N/A;  . CATARACT EXTRACTION W/ INTRAOCULAR LENS  IMPLANT, BILATERAL Bilateral 2014-2015  . CORONARY ANGIOPLASTY WITH STENT PLACEMENT  10/31/2011   30 % MID ATRIOVENTRICULAR GROOVE CX STENOSIS. 90 % MID RCA.PTA TO LAD/STENTING OF TANDEM 60-70%. LAD STENOSIS 99% REDUCED TO 0%.3.0 X 32MM PROMUS DES POSTDILATED TO 3.25MM.  [name] CORONARY ANGIOPLASTY WITH STENT PLACEMENT  07/03/2014   "2"  . I&D EXTREMITY Right 10/30/2013   Procedure: IRRIGATION AND DEBRIDEMENT RIGHT SMALL FINGER POSSIBLE SECONDARY WOUND CLOSURE;  Surgeon: [name], MD;  [facility]: [facility];  Service: Orthopedics;  Laterality: Right;  [name] is available after 4pm  . I&D EXTREMITY Right 11/01/2013   Procedure:  IRRIGATION AND DEBRIDEMENT RIGHT  PROXIMAL PHALANGEAL LEVEL AMPUTATION;  Surgeon: [name], MD;  Location: [facility];  Service: Orthopedics;  Laterality: Right;  . INCISION AND DRAINAGE Right 10/28/2013   Procedure: INCISION AND DRAINAGE Right small finger;  Surgeon: [name], MD;  Location: [facility];  Service: Orthopedics;  Laterality: Right;  . LEFT HEART CATH Left 11/03/2011   Procedure: LEFT HEART CATH;  Surgeon: [name], MD;  Location[facility] CATH LAB;  Service: Cardiovascular;  Laterality: Left;  . LEFT HEART CATHETERIZATION WITH CORONARY ANGIOGRAM N/A 07/03/2014   Procedure: LEFT HEART CATHETERIZATION WITH CORONARY ANGIOGRAM;  Surgeon: [name], MD;  Location: [facility] CATH LAB;  Service: Cardiovascular;  Laterality: N/A;  . PERCUTANEOUS CORONARY STENT INTERVENTION (PCI-S) Left 11/03/2011   Procedure: PERCUTANEOUS CORONARY STENT INTERVENTION (PCI-S);  Surgeon: [name], MD;  Location: [facility] CATH LAB;  Service: Cardiovascular;   Laterality: Left;  . TOE AMPUTATION Right ~ 2010   "2nd toe"  . TOE AMPUTATION Right 2012   "3rd toe"   Family History  Problem Relation Age of Onset  . Heart attack Maternal Aunt   . Diabetes Maternal Grandmother   . Hypertension Maternal Grandmother    Social History:  reports that he has never smoked. He has never used smokeless tobacco. He reports that he drinks alcohol. He reports that he does not use drugs. Allergies: No Known Allergies Medications Prior to Admission  Medication Sig Dispense Refill  . acetaminophen (TYLENOL) 325 MG tablet Take 2 tablets (650 mg total) by mouth every 4 (four) hours as needed for headache or mild pain.    [name] allopurinol (ZYLOPRIM) 300 MG tablet Take 300 mg by mouth daily.     [name] amLODipine (NORVASC) 10 MG tablet Take 1 tablet (10 mg total) by mouth daily.    [name] aspirin EC 81 MG tablet Take 81 mg by mouth daily.    . ceftaroline 600 mg in sodium chloride 0.9 % 250 mL Inject 600 mg into the vein every 12 (twelve) hours. Stop date 08/15/16    . doxazosin (CARDURA) 8 MG tablet Take 8 mg by mouth daily.   5  . feeding supplement, GLUCERNA SHAKE, (GLUCERNA SHAKE) LIQD Take 237 mLs by mouth 2 (two) times daily between meals.  0  .  hydrALAZINE (APRESOLINE) 25 MG tablet Take 1.5 tablets (37.5 mg total) by mouth 2 (two) times daily. (Patient taking differently: Take 25 mg by mouth 2 (two) times daily. ) 90 tablet 11  . insulin aspart (NOVOLOG) 100 UNIT/ML injection Inject 0-20 Units into the skin 3 (three) times daily with meals. 10 mL 11  . insulin NPH Human (HUMULIN N,NOVOLIN N) 100 UNIT/ML injection Inject 0.2 mLs (20 Units total) into the skin daily before breakfast. 10 mL 11  . insulin NPH Human (HUMULIN N,NOVOLIN N) 100 UNIT/ML injection Inject 0.22 mLs (22 Units total) into the skin at bedtime. 10 mL 11  . isosorbide mononitrate (IMDUR) 30 MG 24 hr tablet Take 3 tablets (90 mg total) by mouth daily.    . methocarbamol (ROBAXIN) 500 MG tablet Take 1 tablet  (500 mg total) by mouth every 6 (six) hours as needed for muscle spasms.    . metoCLOPramide (REGLAN) 5 MG tablet Take 1-2 tablets (5-10 mg total) by mouth every 8 (eight) hours as needed for nausea (if ondansetron (ZOFRAN) ineffective.).    [name] metoprolol succinate (TOPROL-XL) 25 MG 24 hr tablet Take 3 tablets (75 mg total) by mouth daily.    . nitroGLYCERIN (NITROSTAT) 0.4 MG SL tablet Place 1 tablet (0.4 mg total) under the tongue every 5 (five) minutes as needed for chest pain. 25 tablet 3  . ondansetron (ZOFRAN) 4 MG tablet Take 1 tablet (4 mg total) by mouth every 6 (six) hours as needed for nausea. 20 tablet 0  . oxyCODONE-acetaminophen (PERCOCET/ROXICET) 5-325 MG tablet Take 1-2 tablets by mouth every 4 (four) hours as needed for moderate pain. 30 tablet 0  . pantoprazole (PROTONIX) 40 MG tablet Take 1 tablet (40 mg total) by mouth daily. 30 tablet 11  . potassium chloride SA (K-DUR,KLOR-CON) 20 MEQ tablet Take 1 tablet (20 mEq total) by mouth daily.    . rosuvastatin (CRESTOR) 40 MG tablet Take 1 tablet (40 mg total) by mouth daily.    . sodium chloride flush (NS) 0.9 % SOLN 10-40 mLs by Intracatheter route as needed (flush).    . sodium chloride flush (NS) 0.9 % SOLN Inject 3 mLs into the vein every 12 (twelve) hours.    . sodium chloride flush (NS) 0.9 % SOLN Inject 3 mLs into the vein as needed.    . ticagrelor (BRILINTA) 90 MG TABS tablet Take 1 tablet (90 mg total) by mouth 2 (two) times daily. 60 tablet     Home: Home Living Family/patient expects to be discharged to:: Private residence Living Arrangements: Spouse/significant other, Children (27 yo son, Jeneen Rinks lives with his parents) Available Help at Discharge: Family, Available PRN/intermittently Type of Home: House Home Access: Stairs to enter Technical brewer of Steps: 2 very small steps Entrance Stairs-Rails: None Home Layout: One level Bathroom Shower/Tub: Tub/shower unit, Walk-in shower (has both available but uses  the tub shower combo) Bathroom Toilet: Handicapped height Bathroom Accessibility: Yes Home Equipment: Crutches Additional Comments: Advanced home care arranged already for d/c home needs from acute hospital  Lives With: Spouse, Son   Functional History: Prior Function Level of Independence: Independent Comments: Drives.  Recently laid off.  Functional Status:  Mobility: Bed Mobility Overal bed mobility: Modified Independent Bed Mobility: Supine to Sit Supine to sit: Modified independent (Device/Increase time), HOB elevated Sit to supine: Min assist General bed mobility comments: OOB in chair Transfers Overall transfer level: Needs assistance Equipment used: Rolling walker (2 wheeled) Transfers: Sit to/from Stand Sit to Stand: Min  assist Stand pivot transfers: Min assist General transfer comment: min assist to stabilize RW as pt using it to assist with standing from chair. Cues for hand placement and anterior translation. No physical assist needed from elevated bed. Total of 4 trials Ambulation/Gait Ambulation/Gait assistance: Min guard, +2 safety/equipment Ambulation Distance (Feet): 22 Feet Assistive device: Rolling walker (2 wheeled) Gait Pattern/deviations: Step-to pattern General Gait Details: Pt completed 4 trials of 20' with gait today with seated rest between each. pt fatigued with each trial, chair pulled behind with HR 89-92 and sats 95-98% on RA. cues for posture, hop length and safety. Pt with one LOB end of session when trying to look behind him while standing with 2 person assist to correct.  Gait velocity: decr Gait velocity interpretation: Below normal speed for age/gender    ADL: ADL Overall ADL's : Needs assistance/impaired Grooming: Sitting, Set up Upper Body Bathing: Set up, Sitting Lower Body Bathing: Minimal assistance, Sitting/lateral leans Upper Body Dressing : Set up, Sitting Lower Body Dressing: Minimal assistance, Sit to/from stand Toilet  Transfer: Minimal assistance, +2 for safety/equipment, Ambulation, +2 for physical assistance Toileting- Clothing Manipulation and Hygiene: Minimal assistance Functional mobility during ADLs: Minimal assistance, +2 for safety/equipment General ADL Comments: Difficulty with balance with LB ADL. Will approach LB ADL using lateral leans to improve safety. May benefit from tiolet aid and long handled sponge. Improved endurance with ADL.  Cognition: Cognition Overall Cognitive Status: Within Functional Limits for tasks assessed Orientation Level: Oriented X4 Cognition Arousal/Alertness: Awake/alert Behavior During Therapy: WFL for tasks assessed/performed Overall Cognitive Status: Within Functional Limits for tasks assessed  Physical Exam: Blood pressure 136/70, pulse 65, temperature 98.2 F (36.8 C), temperature source Oral, resp. rate 18, height _0  (1.753 m), weight 135.3 kg (298 lb 4.5 oz), SpO2 98 %. Physical Exam Constitutional: He is oriented to person, place, and time. He appears well-developed and well-nourished.  obese  HENT:  Head: Normocephalic and atraumatic.  Mouth/Throat: Oropharynx is clear and moist.  Eyes: Conjunctivae and EOM are normal. Pupils are equal, round, and reactive to light. Right eye exhibits no discharge.  Neck: Normal range of motion. Neck supple.  Cardiovascular: Normal rate and regular rhythm.   No murmur heard. Respiratory: Effort normal and breath sounds normal. No respiratory distress. He has no wheezes.  GI: Soft. Bowel sounds are normal. He exhibits no distension. There is no tenderness.  Musculoskeletal: He exhibits edema. He exhibits no tenderness.  1+ pitting edema right shin/foot.  Wound clean with s/s drainage right foot.  Neurological: He is alert and oriented to person, place, and time. No cranial nerve deficit. Coordination normal.  Alert and oriented. Follows all commands. UE 5/5. RLE: 3/5HF, ADF/PF 2/5 and limited by pain/dressing.  LLE:  4/5 prox to distal. Senses pain in both lower ext but decreased LT distally LLE.  Skin: Skin is warm and dry. No erythema.  Psychiatric: He has a normal mood and affect. His behavior is normal. Judgment and thought content normal   Results for orders placed or performed during the hospital encounter of 07/18/16 (from the past 48 hour(s))  Glucose, capillary     Status: Abnormal   Collection Time: 08/01/16  9:19 PM  Result Value Ref Range   Glucose-Capillary 231 (H) 65 - 99 mg/dL  Basic metabolic panel     Status: Abnormal   Collection Time: 08/02/16  5:10 AM  Result Value Ref Range   Sodium 137 135 - 145 mmol/L   Potassium 3.7 3.5 -  5.1 mmol/L   Chloride 104 101 - 111 mmol/L   CO2 26 22 - 32 mmol/L   Glucose, Bld 206 (H) 65 - 99 mg/dL   BUN 14 6 - 20 mg/dL   Creatinine, Ser 1.71 (H) 0.61 - 1.24 mg/dL   Calcium 8.6 (L) 8.9 - 10.3 mg/dL   GFR calc non Af Amer 43 (L) >60 mL/min   GFR calc Af Amer 49 (L) >60 mL/min    Comment: (NOTE) The eGFR has been calculated using the CKD EPI equation. This calculation has not been validated in all clinical situations. eGFR's persistently <60 mL/min signify possible Chronic Kidney Disease.    Anion gap 7 5 - 15  CBC     Status: Abnormal   Collection Time: 08/02/16  5:10 AM  Result Value Ref Range   WBC 10.2 4.0 - 10.5 K/uL   RBC 3.49 (L) 4.22 - 5.81 MIL/uL   Hemoglobin 9.3 (L) 13.0 - 17.0 g/dL   HCT 30.5 (L) 39.0 - 52.0 %   MCV 87.4 78.0 - 100.0 fL   MCH 26.6 26.0 - 34.0 pg   MCHC 30.5 30.0 - 36.0 g/dL   RDW 14.5 11.5 - 15.5 %   Platelets 311 150 - 400 K/uL  Glucose, capillary     Status: Abnormal   Collection Time: 08/02/16  6:27 AM  Result Value Ref Range   Glucose-Capillary 160 (H) 65 - 99 mg/dL  Glucose, capillary     Status: Abnormal   Collection Time: 08/02/16 11:11 AM  Result Value Ref Range   Glucose-Capillary 168 (H) 65 - 99 mg/dL   Comment 1 Notify RN    Comment 2 Document in Chart   Glucose, capillary     Status:  Abnormal   Collection Time: 08/02/16  4:07 PM  Result Value Ref Range   Glucose-Capillary 221 (H) 65 - 99 mg/dL   Comment 1 Notify RN    Comment 2 Document in Chart   Glucose, capillary     Status: Abnormal   Collection Time: 08/02/16  8:55 PM  Result Value Ref Range   Glucose-Capillary 189 (H) 65 - 99 mg/dL  Basic metabolic panel     Status: Abnormal   Collection Time: 08/03/16  2:42 AM  Result Value Ref Range   Sodium 135 135 - 145 mmol/L   Potassium 4.1 3.5 - 5.1 mmol/L   Chloride 104 101 - 111 mmol/L   CO2 26 22 - 32 mmol/L   Glucose, Bld 264 (H) 65 - 99 mg/dL   BUN 20 6 - 20 mg/dL   Creatinine, Ser 1.93 (H) 0.61 - 1.24 mg/dL   Calcium 8.5 (L) 8.9 - 10.3 mg/dL   GFR calc non Af Amer 37 (L) >60 mL/min   GFR calc Af Amer 43 (L) >60 mL/min    Comment: (NOTE) The eGFR has been calculated using the CKD EPI equation. This calculation has not been validated in all clinical situations. eGFR's persistently <60 mL/min signify possible Chronic Kidney Disease.    Anion gap 5 5 - 15  CBC     Status: Abnormal   Collection Time: 08/03/16  2:42 AM  Result Value Ref Range   WBC 10.5 4.0 - 10.5 K/uL   RBC 3.43 (L) 4.22 - 5.81 MIL/uL   Hemoglobin 9.2 (L) 13.0 - 17.0 g/dL   HCT 29.9 (L) 39.0 - 52.0 %   MCV 87.2 78.0 - 100.0 fL   MCH 26.8 26.0 - 34.0 pg   MCHC  30.8 30.0 - 36.0 g/dL   RDW 14.5 11.5 - 15.5 %   Platelets 320 150 - 400 K/uL  Glucose, capillary     Status: Abnormal   Collection Time: 08/03/16  6:58 AM  Result Value Ref Range   Glucose-Capillary 196 (H) 65 - 99 mg/dL  Glucose, capillary     Status: Abnormal   Collection Time: 08/03/16 11:01 AM  Result Value Ref Range   Glucose-Capillary 233 (H) 65 - 99 mg/dL   Comment 1 Notify RN    Comment 2 Document in Chart   Glucose, capillary     Status: Abnormal   Collection Time: 08/03/16  4:23 PM  Result Value Ref Range   Glucose-Capillary 226 (H) 65 - 99 mg/dL   Comment 1 Notify RN    Comment 2 Document in Chart    No  results found.     Medical Problem List and Plan: 1.  Debility secondary to PAD right transmetatarsal amputation secondary to osteomyelitis 07/24/2016/multi-medical. Nonweightbearing right lower extremity 2.  DVT Prophylaxis/Anticoagulation: Brilinta 3. Pain Management: Percocet and Robaxin as needed. 4. Acute blood loss anemia. Follow-up CBC 5. Neuropsych: This patient is capable of making decisions on his own behalf. 6. Skin/Wound Care: Routine skin checks 7. Fluids/Electrolytes/Nutrition: Routine I&O's with follow-up chemistries 8. ID. Continue intravenous Teflaro 07/25/2016 3 weeks total (infectious disease 9. CAD. Status post stenting 07/28/2016. Follow-up cardiology services 10. Chronic combined systolic and diastolic congestive heart failure. Monitor for any signs of fluid overload 11. Diabetes mellitus with peripheral neuropathy. Hemoglobin A1c 9.5. NPH insulin 20 units a.m. 22 units daily at bedtime. Check blood sugars before meals and at bedtime with diabetic teaching 12. Hypertension. Imdur 90 mg daily, Toprol-XL 75 mg daily, hydralazine 37.5 mg twice a day, Cardura 8 mg daily, Norvasc 10 mg daily. Monitor with increased mobility 13. CRI. Creatinine 1.60-1.78. Follow-up chemistries 14. Hyperlipidemia. Crestor  Post Admission Physician Evaluation: 1. Functional deficits secondary  to debility and PAD requiring right TMA. 2. Patient is admitted to receive collaborative, interdisciplinary care between the physiatrist, rehab nursing staff, and therapy team. 3. Patient's level of medical complexity and substantial therapy needs in context of that medical necessity cannot be provided at a lesser intensity of care such as a SNF. 4. Patient has experienced substantial functional loss from his/her baseline which was documented above under the "Functional History" and "Functional Status" headings.  Judging by the patient's diagnosis, physical exam, and functional history, the patient has  potential for functional progress which will result in measurable gains while on inpatient rehab.  These gains will be of substantial and practical use upon discharge  in facilitating mobility and self-care at the household level. 5. Physiatrist will provide 24 hour management of medical needs as well as oversight of the therapy plan/treatment and provide guidance as appropriate regarding the interaction of the two. 6. 24 hour rehab nursing will assist with bladder management, bowel management, safety, skin/wound care, disease management, medication administration, pain management and patient education  and help integrate therapy concepts, techniques,education, etc. 7. PT will assess and treat for/with: Lower extremity strength, range of motion, stamina, balance, functional mobility, safety, adaptive techniques and equipment, NMR, weight bearing precautions, safety.   Goals are: mod i. 8. OT will assess and treat for/with: ADL's, functional mobility, safety, upper extremity strength, adaptive techniques and equipment, pain mgt, wb precautions, safety awareness, community reintegration.   Goals are: mod I. Therapy may proceed with showering this patient if wound covered. 9. SLP will  assess and treat for/with: n/a.  Goals are: n/a. 10. Case Management and Social Worker will assess and treat for psychological issues and discharge planning. 11. Team conference will be held weekly to assess progress toward goals and to determine barriers to discharge. 12. Patient will receive at least 3 hours of therapy per day at least 5 days per week. 13. ELOS: 7 days       14. Prognosis:  excellent     Meredith Staggers, MD, [place] 08/03/2016  08/03/2016

## 2016-08-03 NOTE — Progress Notes (Signed)
I have [employer] approval to admit pt to inpt rehab today. Pt is aware and in agreement. RN CM made aware. RN to contact attending MD for clarification if we can plan d/c to inpt rehab today. [phone]

## 2016-08-03 NOTE — H&P (View-Only) (Signed)
Physical Medicine and Rehabilitation Admission H&P    Chief Complaint  Patient presents with  . Chest Pain  : HPI: Joshua [name] is a 60 y.o. male with history of CAD, T2DM with gastroparesis,Chronic renal insufficiency with creatinine 1.60-1.78, chronic diabetic foot ulcer and polyneuropathy, morbid obesity, pulmonary sarcoidosis. Per chart review patient lives with wife and 3 adult children. Wife works during the day. One level home with one-step entry. Admitted on 07/18/16 with unstable angina and ACS. He was placed on IV heparin/IV NTG and cardiac cath revealed 3 vessel disease with normal LVF.  Dr. Servando Snare recommended CABG but patient declined and elected PCI.  He was found to have abscess in plantar soft tissue at distal phalanx with osteomyelitis of right great toe. Dr. Tommy Medal consulted and recommended amputation .  PCI deferred till infection under control and he underwent  Right great toe trans metartarsal amputation on 08/12 by Dr. Sharol Given. Placed on intravenous TEFLARO 07/25/2016 3 weeks. Prevena VAC placed and patient to be NWB RLE. He was started on IV antibiotics and 2 weeks recommended for treatment per ID.Wound VAC was later discontinued 08/03/2016. Subcutaneous heparin for DVT prophylaxis.  He was hydrated for management of acute renal failure and underwent cardiac cath with PCI 07/28/2016 and maintained on aspirin as well as Brilinta. PT/OT evaluations done and CIR recommended for follow up therapy. Patient was admitted for a comprehensive rehabilitation program  ROS HENT: Negative for hearing loss.   Eyes: Negative for blurred vision and double vision.  Respiratory: Negative for cough and shortness of breath.   Cardiovascular: Negative for chest pain and palpitations.  Gastrointestinal: Negative for constipation, diarrhea, heartburn and nausea.  Genitourinary: Negative for dysuria and urgency.  Musculoskeletal: Positive for back pain (lower back) and joint pain. Negative  for myalgias and neck pain.  Skin: Negative for itching and rash.  Neurological: Positive for weakness. Negative for dizziness, tingling, speech change, focal weakness and headaches.  Psychiatric/Behavioral: Negative for depression and memory loss. The patient does not have insomnia   Past Medical History:  Diagnosis Date  . Chronic combined systolic and diastolic CHF (congestive heart failure) ([place])    a. 07/2015 Echo: EF 45-50%, mod LVH, mid apical/antsept AK, Gr 1 DD.  [name] Coronary artery disease    a. 2012 s/p MI/PCI: s/p DES to mid/dist RCA and overlapping DES to LAD;  b. 07/2015 Cath: LAD 10% ISR, D1 40(jailed), LCX 96m OM2 30, OM3 30, RCA 527mSR, 10d ISR.  . Diabetic gastroparesis ([facility]  . Essential hypertension   . GERD (gastroesophageal reflux disease)   . Gout   . Heart murmur   . Hyperlipemia   . Morbid obesity ([facility]  . Myocardial infarction ([facility]2012  . OSA on CPAP   . Polyneuropathy in diabetes(357.2)   . Pulmonary sarcoidosis ([facility]   "problems w/it years ago; no problems anymore" (07/03/2014)  . Status post transmetatarsal amputation of right foot (HC[facility] [place]8/22/2017  . Type II diabetes mellitus ([facility]   Past Surgical History:  Procedure Laterality Date  . AMPUTATION Right 07/24/2016   Procedure: TRANSMETATARSAL FOOT AMPUTATION;  Surgeon: MaNewt MinionMD;  [place] [facility] [place] Service: [facility]ics;  Laterality: Right;  . BREAST LUMPECTOMY Right ~ 2000   "benign"  . CARDIAC CATHETERIZATION N/A 07/30/2015   Procedure: Left Heart Cath and Coronary Angiography;  Surgeon: ChBurnell BlanksMD;  [facility];  Service: Cardiovascular;  Laterality: N/A;  . CARDIAC  CATHETERIZATION N/A 07/19/2016   Procedure: Left Heart Cath and Coronary Angiography;  Surgeon: Belva Crome, MD;  Location: [place] [facility] LAB;  Service: Cardiovascular;  Laterality: N/A;  . CARDIAC CATHETERIZATION N/A 07/29/2016   Procedure: Coronary Stent Intervention;  Surgeon: Belva Crome,  MD;  Location: [place] CV LAB;  Service: Cardiovascular;  Laterality: N/A;  . CARDIAC CATHETERIZATION N/A 07/29/2016   Procedure: Intravascular Ultrasound/IVUS;  Surgeon: Belva Crome, MD;  Location: [place] CV LAB;  Service: Cardiovascular;  Laterality: N/A;  . CATARACT EXTRACTION W/ INTRAOCULAR LENS  IMPLANT, BILATERAL Bilateral 2014-2015  . CORONARY ANGIOPLASTY WITH STENT PLACEMENT  10/31/2011   30 % MID ATRIOVENTRICULAR GROOVE CX STENOSIS. 90 % MID RCA.PTA TO LAD/STENTING OF TANDEM 60-70%. LAD STENOSIS 99% REDUCED TO 0%.3.0 X 32MM PROMUS DES POSTDILATED TO 3.25MM.  [name] CORONARY ANGIOPLASTY WITH STENT PLACEMENT  07/03/2014   "2"  . I&D EXTREMITY Right 10/30/2013   Procedure: IRRIGATION AND DEBRIDEMENT RIGHT SMALL FINGER POSSIBLE SECONDARY WOUND CLOSURE;  Surgeon: [name], MD;  [facility]: [facility];  Service: Orthopedics;  Laterality: Right;  [name] is available after 4pm  . I&D EXTREMITY Right 11/01/2013   Procedure:  IRRIGATION AND DEBRIDEMENT RIGHT  PROXIMAL PHALANGEAL LEVEL AMPUTATION;  Surgeon: [name], MD;  Location: [facility];  Service: Orthopedics;  Laterality: Right;  . INCISION AND DRAINAGE Right 10/28/2013   Procedure: INCISION AND DRAINAGE Right small finger;  Surgeon: [name], MD;  Location: [facility];  Service: Orthopedics;  Laterality: Right;  . LEFT HEART CATH Left 11/03/2011   Procedure: LEFT HEART CATH;  Surgeon: [name], MD;  Location[facility] CATH LAB;  Service: Cardiovascular;  Laterality: Left;  . LEFT HEART CATHETERIZATION WITH CORONARY ANGIOGRAM N/A 07/03/2014   Procedure: LEFT HEART CATHETERIZATION WITH CORONARY ANGIOGRAM;  Surgeon: [name], MD;  Location: [facility] CATH LAB;  Service: Cardiovascular;  Laterality: N/A;  . PERCUTANEOUS CORONARY STENT INTERVENTION (PCI-S) Left 11/03/2011   Procedure: PERCUTANEOUS CORONARY STENT INTERVENTION (PCI-S);  Surgeon: [name], MD;  Location: [facility] CATH LAB;  Service: Cardiovascular;   Laterality: Left;  . TOE AMPUTATION Right ~ 2010   "2nd toe"  . TOE AMPUTATION Right 2012   "3rd toe"   Family History  Problem Relation Age of Onset  . Heart attack Maternal Aunt   . Diabetes Maternal Grandmother   . Hypertension Maternal Grandmother    Social History:  reports that he has never smoked. He has never used smokeless tobacco. He reports that he drinks alcohol. He reports that he does not use drugs. Allergies: No Known Allergies Medications Prior to Admission  Medication Sig Dispense Refill  . acetaminophen (TYLENOL) 325 MG tablet Take 2 tablets (650 mg total) by mouth every 4 (four) hours as needed for headache or mild pain.    [name] allopurinol (ZYLOPRIM) 300 MG tablet Take 300 mg by mouth daily.     [name] amLODipine (NORVASC) 10 MG tablet Take 1 tablet (10 mg total) by mouth daily.    [name] aspirin EC 81 MG tablet Take 81 mg by mouth daily.    . ceftaroline 600 mg in sodium chloride 0.9 % 250 mL Inject 600 mg into the vein every 12 (twelve) hours. Stop date 08/15/16    . doxazosin (CARDURA) 8 MG tablet Take 8 mg by mouth daily.   5  . feeding supplement, GLUCERNA SHAKE, (GLUCERNA SHAKE) LIQD Take 237 mLs by mouth 2 (two) times daily between meals.  0  .  hydrALAZINE (APRESOLINE) 25 MG tablet Take 1.5 tablets (37.5 mg total) by mouth 2 (two) times daily. (Patient taking differently: Take 25 mg by mouth 2 (two) times daily. ) 90 tablet 11  . insulin aspart (NOVOLOG) 100 UNIT/ML injection Inject 0-20 Units into the skin 3 (three) times daily with meals. 10 mL 11  . insulin NPH Human (HUMULIN N,NOVOLIN N) 100 UNIT/ML injection Inject 0.2 mLs (20 Units total) into the skin daily before breakfast. 10 mL 11  . insulin NPH Human (HUMULIN N,NOVOLIN N) 100 UNIT/ML injection Inject 0.22 mLs (22 Units total) into the skin at bedtime. 10 mL 11  . isosorbide mononitrate (IMDUR) 30 MG 24 hr tablet Take 3 tablets (90 mg total) by mouth daily.    . methocarbamol (ROBAXIN) 500 MG tablet Take 1 tablet  (500 mg total) by mouth every 6 (six) hours as needed for muscle spasms.    . metoCLOPramide (REGLAN) 5 MG tablet Take 1-2 tablets (5-10 mg total) by mouth every 8 (eight) hours as needed for nausea (if ondansetron (ZOFRAN) ineffective.).    [name] metoprolol succinate (TOPROL-XL) 25 MG 24 hr tablet Take 3 tablets (75 mg total) by mouth daily.    . nitroGLYCERIN (NITROSTAT) 0.4 MG SL tablet Place 1 tablet (0.4 mg total) under the tongue every 5 (five) minutes as needed for chest pain. 25 tablet 3  . ondansetron (ZOFRAN) 4 MG tablet Take 1 tablet (4 mg total) by mouth every 6 (six) hours as needed for nausea. 20 tablet 0  . oxyCODONE-acetaminophen (PERCOCET/ROXICET) 5-325 MG tablet Take 1-2 tablets by mouth every 4 (four) hours as needed for moderate pain. 30 tablet 0  . pantoprazole (PROTONIX) 40 MG tablet Take 1 tablet (40 mg total) by mouth daily. 30 tablet 11  . potassium chloride SA (K-DUR,KLOR-CON) 20 MEQ tablet Take 1 tablet (20 mEq total) by mouth daily.    . rosuvastatin (CRESTOR) 40 MG tablet Take 1 tablet (40 mg total) by mouth daily.    . sodium chloride flush (NS) 0.9 % SOLN 10-40 mLs by Intracatheter route as needed (flush).    . sodium chloride flush (NS) 0.9 % SOLN Inject 3 mLs into the vein every 12 (twelve) hours.    . sodium chloride flush (NS) 0.9 % SOLN Inject 3 mLs into the vein as needed.    . ticagrelor (BRILINTA) 90 MG TABS tablet Take 1 tablet (90 mg total) by mouth 2 (two) times daily. 60 tablet     Home: Home Living Family/patient expects to be discharged to:: Private residence Living Arrangements: Spouse/significant other, Children (27 yo son, Jeneen Rinks lives with his parents) Available Help at Discharge: Family, Available PRN/intermittently Type of Home: House Home Access: Stairs to enter Technical brewer of Steps: 2 very small steps Entrance Stairs-Rails: None Home Layout: One level Bathroom Shower/Tub: Tub/shower unit, Walk-in shower (has both available but uses  the tub shower combo) Bathroom Toilet: Handicapped height Bathroom Accessibility: Yes Home Equipment: Crutches Additional Comments: Advanced home care arranged already for d/c home needs from acute hospital  Lives With: Spouse, Son   Functional History: Prior Function Level of Independence: Independent Comments: Drives.  Recently laid off.  Functional Status:  Mobility: Bed Mobility Overal bed mobility: Modified Independent Bed Mobility: Supine to Sit Supine to sit: Modified independent (Device/Increase time), HOB elevated Sit to supine: Min assist General bed mobility comments: OOB in chair Transfers Overall transfer level: Needs assistance Equipment used: Rolling walker (2 wheeled) Transfers: Sit to/from Stand Sit to Stand: Min  assist Stand pivot transfers: Min assist General transfer comment: min assist to stabilize RW as pt using it to assist with standing from chair. Cues for hand placement and anterior translation. No physical assist needed from elevated bed. Total of 4 trials Ambulation/Gait Ambulation/Gait assistance: Min guard, +2 safety/equipment Ambulation Distance (Feet): 22 Feet Assistive device: Rolling walker (2 wheeled) Gait Pattern/deviations: Step-to pattern General Gait Details: Pt completed 4 trials of 20' with gait today with seated rest between each. pt fatigued with each trial, chair pulled behind with HR 89-92 and sats 95-98% on RA. cues for posture, hop length and safety. Pt with one LOB end of session when trying to look behind him while standing with 2 person assist to correct.  Gait velocity: decr Gait velocity interpretation: Below normal speed for age/gender    ADL: ADL Overall ADL's : Needs assistance/impaired Grooming: Sitting, Set up Upper Body Bathing: Set up, Sitting Lower Body Bathing: Minimal assistance, Sitting/lateral leans Upper Body Dressing : Set up, Sitting Lower Body Dressing: Minimal assistance, Sit to/from stand Toilet  Transfer: Minimal assistance, +2 for safety/equipment, Ambulation, +2 for physical assistance Toileting- Clothing Manipulation and Hygiene: Minimal assistance Functional mobility during ADLs: Minimal assistance, +2 for safety/equipment General ADL Comments: Difficulty with balance with LB ADL. Will approach LB ADL using lateral leans to improve safety. May benefit from tiolet aid and long handled sponge. Improved endurance with ADL.  Cognition: Cognition Overall Cognitive Status: Within Functional Limits for tasks assessed Orientation Level: Oriented X4 Cognition Arousal/Alertness: Awake/alert Behavior During Therapy: WFL for tasks assessed/performed Overall Cognitive Status: Within Functional Limits for tasks assessed  Physical Exam: Blood pressure 136/70, pulse 65, temperature 98.2 F (36.8 C), temperature source Oral, resp. rate 18, height _0  (1.753 m), weight 135.3 kg (298 lb 4.5 oz), SpO2 98 %. Physical Exam Constitutional: He is oriented to person, place, and time. He appears well-developed and well-nourished.  obese  HENT:  Head: Normocephalic and atraumatic.  Mouth/Throat: Oropharynx is clear and moist.  Eyes: Conjunctivae and EOM are normal. Pupils are equal, round, and reactive to light. Right eye exhibits no discharge.  Neck: Normal range of motion. Neck supple.  Cardiovascular: Normal rate and regular rhythm.   No murmur heard. Respiratory: Effort normal and breath sounds normal. No respiratory distress. He has no wheezes.  GI: Soft. Bowel sounds are normal. He exhibits no distension. There is no tenderness.  Musculoskeletal: He exhibits edema. He exhibits no tenderness.  1+ pitting edema right shin/foot.  Wound clean with s/s drainage right foot.  Neurological: He is alert and oriented to person, place, and time. No cranial nerve deficit. Coordination normal.  Alert and oriented. Follows all commands. UE 5/5. RLE: 3/5HF, ADF/PF 2/5 and limited by pain/dressing.  LLE:  4/5 prox to distal. Senses pain in both lower ext but decreased LT distally LLE.  Skin: Skin is warm and dry. No erythema.  Psychiatric: He has a normal mood and affect. His behavior is normal. Judgment and thought content normal   Results for orders placed or performed during the hospital encounter of 07/18/16 (from the past 48 hour(s))  Glucose, capillary     Status: Abnormal   Collection Time: 08/01/16  9:19 PM  Result Value Ref Range   Glucose-Capillary 231 (H) 65 - 99 mg/dL  Basic metabolic panel     Status: Abnormal   Collection Time: 08/02/16  5:10 AM  Result Value Ref Range   Sodium 137 135 - 145 mmol/L   Potassium 3.7 3.5 -  5.1 mmol/L   Chloride 104 101 - 111 mmol/L   CO2 26 22 - 32 mmol/L   Glucose, Bld 206 (H) 65 - 99 mg/dL   BUN 14 6 - 20 mg/dL   Creatinine, Ser 1.71 (H) 0.61 - 1.24 mg/dL   Calcium 8.6 (L) 8.9 - 10.3 mg/dL   GFR calc non Af Amer 43 (L) >60 mL/min   GFR calc Af Amer 49 (L) >60 mL/min    Comment: (NOTE) The eGFR has been calculated using the CKD EPI equation. This calculation has not been validated in all clinical situations. eGFR's persistently <60 mL/min signify possible Chronic Kidney Disease.    Anion gap 7 5 - 15  CBC     Status: Abnormal   Collection Time: 08/02/16  5:10 AM  Result Value Ref Range   WBC 10.2 4.0 - 10.5 K/uL   RBC 3.49 (L) 4.22 - 5.81 MIL/uL   Hemoglobin 9.3 (L) 13.0 - 17.0 g/dL   HCT 30.5 (L) 39.0 - 52.0 %   MCV 87.4 78.0 - 100.0 fL   MCH 26.6 26.0 - 34.0 pg   MCHC 30.5 30.0 - 36.0 g/dL   RDW 14.5 11.5 - 15.5 %   Platelets 311 150 - 400 K/uL  Glucose, capillary     Status: Abnormal   Collection Time: 08/02/16  6:27 AM  Result Value Ref Range   Glucose-Capillary 160 (H) 65 - 99 mg/dL  Glucose, capillary     Status: Abnormal   Collection Time: 08/02/16 11:11 AM  Result Value Ref Range   Glucose-Capillary 168 (H) 65 - 99 mg/dL   Comment 1 Notify RN    Comment 2 Document in Chart   Glucose, capillary     Status:  Abnormal   Collection Time: 08/02/16  4:07 PM  Result Value Ref Range   Glucose-Capillary 221 (H) 65 - 99 mg/dL   Comment 1 Notify RN    Comment 2 Document in Chart   Glucose, capillary     Status: Abnormal   Collection Time: 08/02/16  8:55 PM  Result Value Ref Range   Glucose-Capillary 189 (H) 65 - 99 mg/dL  Basic metabolic panel     Status: Abnormal   Collection Time: 08/03/16  2:42 AM  Result Value Ref Range   Sodium 135 135 - 145 mmol/L   Potassium 4.1 3.5 - 5.1 mmol/L   Chloride 104 101 - 111 mmol/L   CO2 26 22 - 32 mmol/L   Glucose, Bld 264 (H) 65 - 99 mg/dL   BUN 20 6 - 20 mg/dL   Creatinine, Ser 1.93 (H) 0.61 - 1.24 mg/dL   Calcium 8.5 (L) 8.9 - 10.3 mg/dL   GFR calc non Af Amer 37 (L) >60 mL/min   GFR calc Af Amer 43 (L) >60 mL/min    Comment: (NOTE) The eGFR has been calculated using the CKD EPI equation. This calculation has not been validated in all clinical situations. eGFR's persistently <60 mL/min signify possible Chronic Kidney Disease.    Anion gap 5 5 - 15  CBC     Status: Abnormal   Collection Time: 08/03/16  2:42 AM  Result Value Ref Range   WBC 10.5 4.0 - 10.5 K/uL   RBC 3.43 (L) 4.22 - 5.81 MIL/uL   Hemoglobin 9.2 (L) 13.0 - 17.0 g/dL   HCT 29.9 (L) 39.0 - 52.0 %   MCV 87.2 78.0 - 100.0 fL   MCH 26.8 26.0 - 34.0 pg   MCHC  30.8 30.0 - 36.0 g/dL   RDW 14.5 11.5 - 15.5 %   Platelets 320 150 - 400 K/uL  Glucose, capillary     Status: Abnormal   Collection Time: 08/03/16  6:58 AM  Result Value Ref Range   Glucose-Capillary 196 (H) 65 - 99 mg/dL  Glucose, capillary     Status: Abnormal   Collection Time: 08/03/16 11:01 AM  Result Value Ref Range   Glucose-Capillary 233 (H) 65 - 99 mg/dL   Comment 1 Notify RN    Comment 2 Document in Chart   Glucose, capillary     Status: Abnormal   Collection Time: 08/03/16  4:23 PM  Result Value Ref Range   Glucose-Capillary 226 (H) 65 - 99 mg/dL   Comment 1 Notify RN    Comment 2 Document in Chart    No  results found.     Medical Problem List and Plan: 1.  Debility secondary to PAD right transmetatarsal amputation secondary to osteomyelitis 07/24/2016/multi-medical. Nonweightbearing right lower extremity 2.  DVT Prophylaxis/Anticoagulation: Brilinta 3. Pain Management: Percocet and Robaxin as needed. 4. Acute blood loss anemia. Follow-up CBC 5. Neuropsych: This patient is capable of making decisions on his own behalf. 6. Skin/Wound Care: Routine skin checks 7. Fluids/Electrolytes/Nutrition: Routine I&O's with follow-up chemistries 8. ID. Continue intravenous Teflaro 07/25/2016 3 weeks total (infectious disease 9. CAD. Status post stenting 07/28/2016. Follow-up cardiology services 10. Chronic combined systolic and diastolic congestive heart failure. Monitor for any signs of fluid overload 11. Diabetes mellitus with peripheral neuropathy. Hemoglobin A1c 9.5. NPH insulin 20 units a.m. 22 units daily at bedtime. Check blood sugars before meals and at bedtime with diabetic teaching 12. Hypertension. Imdur 90 mg daily, Toprol-XL 75 mg daily, hydralazine 37.5 mg twice a day, Cardura 8 mg daily, Norvasc 10 mg daily. Monitor with increased mobility 13. CRI. Creatinine 1.60-1.78. Follow-up chemistries 14. Hyperlipidemia. Crestor  Post Admission Physician Evaluation: 1. Functional deficits secondary  to debility and PAD requiring right TMA. 2. Patient is admitted to receive collaborative, interdisciplinary care between the physiatrist, rehab nursing staff, and therapy team. 3. Patient's level of medical complexity and substantial therapy needs in context of that medical necessity cannot be provided at a lesser intensity of care such as a SNF. 4. Patient has experienced substantial functional loss from his/her baseline which was documented above under the "Functional History" and "Functional Status" headings.  Judging by the patient's diagnosis, physical exam, and functional history, the patient has  potential for functional progress which will result in measurable gains while on inpatient rehab.  These gains will be of substantial and practical use upon discharge  in facilitating mobility and self-care at the household level. 5. Physiatrist will provide 24 hour management of medical needs as well as oversight of the therapy plan/treatment and provide guidance as appropriate regarding the interaction of the two. 6. 24 hour rehab nursing will assist with bladder management, bowel management, safety, skin/wound care, disease management, medication administration, pain management and patient education  and help integrate therapy concepts, techniques,education, etc. 7. PT will assess and treat for/with: Lower extremity strength, range of motion, stamina, balance, functional mobility, safety, adaptive techniques and equipment, NMR, weight bearing precautions, safety.   Goals are: mod i. 8. OT will assess and treat for/with: ADL's, functional mobility, safety, upper extremity strength, adaptive techniques and equipment, pain mgt, wb precautions, safety awareness, community reintegration.   Goals are: mod I. Therapy may proceed with showering this patient if wound covered. 9. SLP will  assess and treat for/with: n/a.  Goals are: n/a. 10. Case Management and Social Worker will assess and treat for psychological issues and discharge planning. 11. Team conference will be held weekly to assess progress toward goals and to determine barriers to discharge. 12. Patient will receive at least 3 hours of therapy per day at least 5 days per week. 13. ELOS: 7 days       14. Prognosis:  excellent     Meredith Staggers, MD, [place] [facility] 08/03/2016  08/03/2016

## 2016-08-03 NOTE — Progress Notes (Signed)
Meredith Staggers, MD Physician Signed Physical Medicine and Rehabilitation  Consult Note Date of Service: 07/29/2016 12:39 PM  Related encounter: ED to Hosp-Admission (Current) from 07/18/2016 in [place] All Collapse All   '[]'$ Hide copied text $RemoveBef'[]'[id number]$ Hover for attribution information      Physical Medicine and Rehabilitation Consult   Reason for Consult: Debility due to multiple medical issues. Referring Physician: Dr. Tana Coast   HPI: Joshua [name] is a 60 y.o. male with history of CAD, T2DM with gastroparesis, chronic diabetic foot ulcer and polyneuropathy, morbid obesity, pulmonary sarcoidosis who was admitted on 07/18/16 with unstable angina and ACS. He was placed on IV heparin/IV NTG and cardiac cath revealed 3 vessel disease with normal LVF.  Dr. Servando Snare recommended CABG but patient declined and elected PCI.  He was found to have abscess in plantar soft tissue at distal phalanx with osteomyelitis of right great toe. Dr. Tommy Medal consulted and recommended amputation and intra-op wound cultures prior to initiating antibiotics.  PCI deferred till infection under control and he underwent  Right great toe trans metartarsal amputation on 08/12 by Dr. Sharol Given. Prevena VAC placed and patient to be NWB RLE. He was started on IV antibiotics and 2 weeks recommended for treatment per ID.  He was hydrated for management of acute renal failure and underwent cardiac cath with PCI yesterday. PT/OT evaluations done and CIR recommended for follow up therapy.     Review of Systems  HENT: Negative for hearing loss.   Eyes: Negative for blurred vision and double vision.  Respiratory: Negative for cough and shortness of breath.   Cardiovascular: Negative for chest pain and palpitations.  Gastrointestinal: Negative for constipation, diarrhea, heartburn and nausea.  Genitourinary: Negative for dysuria and urgency.  Musculoskeletal: Positive for back pain (lower  back) and joint pain. Negative for myalgias and neck pain.  Skin: Negative for itching and rash.  Neurological: Positive for weakness. Negative for dizziness, tingling, speech change, focal weakness and headaches.  Psychiatric/Behavioral: Negative for depression and memory loss. The patient does not have insomnia.           Past Medical History:  Diagnosis Date  . Chronic combined systolic and diastolic CHF (congestive heart failure) ([place])    a. 07/2015 Echo: EF 45-50%, mod LVH, mid apical/antsept AK, Gr 1 DD.  [name] Coronary artery disease    a. 2012 s/p MI/PCI: s/p DES to mid/dist RCA and overlapping DES to LAD;  b. 07/2015 Cath: LAD 10% ISR, D1 40(jailed), LCX 37m, OM2 30, OM3 30, RCA 11m ISR, 10d ISR.  . Diabetic gastroparesis ([place])   . Essential hypertension   . GERD (gastroesophageal reflux disease)   . Gout   . Heart murmur   . Hyperlipemia   . Morbid obesity ([place])   . Myocardial infarction ([place]) 2012  . OSA on CPAP   . Polyneuropathy in diabetes(357.2)   . Pulmonary sarcoidosis ([place])    "problems w/it years ago; no problems anymore" (07/03/2014)  . Type II diabetes mellitus ([place])          Past Surgical History:  Procedure Laterality Date  . AMPUTATION Right 07/24/2016   Procedure: TRANSMETATARSAL FOOT AMPUTATION;  Surgeon: [name], MD;  Location: [facility];  Service: Orthopedics;  Laterality: Right;  . BREAST LUMPECTOMY Right ~ 2000   "benign"  . CARDIAC CATHETERIZATION N/A 07/30/2015   Procedure: Left Heart Cath and Coronary Angiography;  Surgeon: Burnell Blanks, MD;  Location: [facility];  Service: Cardiovascular;  Laterality: N/A;  . CARDIAC CATHETERIZATION N/A 07/19/2016   Procedure: Left Heart Cath and Coronary Angiography;  Surgeon: Belva Crome, MD;  Location: [facility];  Service: Cardiovascular;  Laterality: N/A;  . CATARACT EXTRACTION W/ INTRAOCULAR LENS  IMPLANT, BILATERAL Bilateral 2014-2015  . CORONARY ANGIOPLASTY  WITH STENT PLACEMENT  10/31/2011   30 % MID ATRIOVENTRICULAR GROOVE CX STENOSIS. 90 % MID RCA.PTA TO LAD/STENTING OF TANDEM 60-70%. LAD STENOSIS 99% REDUCED TO 0%.3.0 X 32MM PROMUS DES POSTDILATED TO 3.25MM.  [name] CORONARY ANGIOPLASTY WITH STENT PLACEMENT  07/03/2014   "2"  . I&D EXTREMITY Right 10/30/2013   Procedure: IRRIGATION AND DEBRIDEMENT RIGHT SMALL FINGER POSSIBLE SECONDARY WOUND CLOSURE;  Surgeon: [name], MD;  Location: [facility];  Service: Orthopedics;  Laterality: Right;  [name] is available after 4pm  . I&D EXTREMITY Right 11/01/2013   Procedure:  IRRIGATION AND DEBRIDEMENT RIGHT  PROXIMAL PHALANGEAL LEVEL AMPUTATION;  Surgeon: [name], MD;  Location: [facility];  Service: Orthopedics;  Laterality: Right;  . INCISION AND DRAINAGE Right 10/28/2013   Procedure: INCISION AND DRAINAGE Right small finger;  Surgeon: [name], MD;  Location: [facility];  Service: Orthopedics;  Laterality: Right;  . LEFT HEART CATH Left 11/03/2011   Procedure: LEFT HEART CATH;  Surgeon: [name], MD;  Location: [facility];  Service: Cardiovascular;  Laterality: Left;  . LEFT HEART CATHETERIZATION WITH CORONARY ANGIOGRAM N/A 07/03/2014   Procedure: LEFT HEART CATHETERIZATION WITH CORONARY ANGIOGRAM;  Surgeon: [name], MD;  Location: [facility];  Service: Cardiovascular;  Laterality: N/A;  . PERCUTANEOUS CORONARY STENT INTERVENTION (PCI-S) Left 11/03/2011   Procedure: PERCUTANEOUS CORONARY STENT INTERVENTION (PCI-S);  Surgeon: [name], MD;  Location: [facility] CATH LAB;  Service: Cardiovascular;  Laterality: Left;  . TOE AMPUTATION Right ~ 2010   "2nd toe"  . TOE AMPUTATION Right 2012   "3rd toe"         Family History  Problem Relation Age of Onset  . Heart attack Maternal Aunt   . Diabetes Maternal Grandmother   . Hypertension Maternal Grandmother     Social History:  Married. Independent--sedentary since March when he got laid off. He   reports that he has never smoked. He has never used smokeless tobacco. He reports that he drinks alcohol--3 beers daily. He reports that he does not use drugs.    Allergies: Not on File          Medications Prior to Admission  Medication Sig Dispense Refill  . allopurinol (ZYLOPRIM) 300 MG tablet Take 300 mg by mouth daily.     [name] amLODipine-olmesartan (AZOR) 10-40 MG tablet Take 1 tablet by mouth daily. 30 tablet 11  . aspirin EC 81 MG tablet Take 81 mg by mouth daily.    [name] doxazosin (CARDURA) 8 MG tablet Take 8 mg by mouth daily.   5  . furosemide (LASIX) 40 MG tablet TAKE 1 TABLET (40 MG TOTAL) BY MOUTH DAILY. 30 tablet 10  . hydrALAZINE (APRESOLINE) 25 MG tablet Take 1.5 tablets (37.5 mg total) by mouth 2 (two) times daily. (Patient taking differently: Take 25 mg by mouth 2 (two) times daily. ) 90 tablet 11  . isosorbide mononitrate (IMDUR) 60 MG 24 hr tablet Take 1 tablet (60 mg total) by mouth daily. 30 tablet 11  . nitroGLYCERIN (NITROSTAT) 0.4 MG SL tablet Place 1 tablet (0.4 mg total) under the  tongue every 5 (five) minutes as needed for chest pain. 25 tablet 3  . NOVOLIN N RELION 100 UNIT/ML injection Inject 50-60 Units into the skin See admin instructions. 50 units in the morning and 60 units in the evening  0  . NOVOLIN R RELION 100 UNIT/ML injection Inject 0-10 Units into the skin 3 (three) times daily before meals. Per sliding scale  0  . pantoprazole (PROTONIX) 40 MG tablet Take 1 tablet (40 mg total) by mouth daily. 30 tablet 11  . rosuvastatin (CRESTOR) 40 MG tablet Take 1 tablet (40 mg total) by mouth daily.    . ticagrelor (BRILINTA) 90 MG TABS tablet Take 90 mg by mouth 2 (two) times daily.    . metoprolol succinate (TOPROL-XL) 100 MG 24 hr tablet Take 1 tablet (100 mg total) by mouth daily. Take with or immediately following a meal. (Patient not taking: Reported on 07/18/2016) 30 tablet 6  . ticagrelor (BRILINTA) 60 MG TABS tablet Take 1 tablet (60 mg total) by  mouth 2 (two) times daily. (Patient not taking: Reported on 07/18/2016) 60 tablet 11    Home: Home Living Family/patient expects to be discharged to:: Private residence Living Arrangements: Spouse/significant other, Children Available Help at Discharge:  (adult children can be home during the day.) Type of Home: House Home Access: Stairs to enter [employer] of Steps: 2 Entrance Stairs-Rails: None Home Layout:  (has 1 step in home to get to bedroom /bathroom) Bathroom Shower/Tub: Tub/shower unit, Architectural technologist: Handicapped height Home Equipment: Crutches  Functional History: Prior Function Level of Independence: Independent Comments:  (from [place]) Functional Status:  Mobility: Bed Mobility Overal bed mobility: Modified Independent Bed Mobility: Supine to Sit Supine to sit: Modified independent (Device/Increase time), HOB elevated Sit to supine: Min assist General bed mobility comments: OOB when PT arrived Transfers Overall transfer level: Needs assistance Equipment used: Rolling walker (2 wheeled) Transfers: Sit to/from Stand, [employer] Transfers Sit to Stand: Min assist Stand pivot transfers: Min assist General transfer comment: Verbal cues for hand placement. Assist to bring hips up. Ambulation/Gait Ambulation/Gait assistance: +2 physical assistance, Min assist Ambulation Distance (Feet): 4 Feet Assistive device: Rolling walker (2 wheeled) Gait Pattern/deviations: Step-to pattern General Gait Details: declined Gait velocity: decr Gait velocity interpretation: Below normal speed for age/gender    ADL: ADL Overall ADL's : Needs assistance/impaired Grooming: Set up, Sitting Upper Body Bathing: Set up, Sitting Lower Body Bathing: Moderate assistance, Sit to/from stand Upper Body Dressing : Set up, Sitting Lower Body Dressing: Moderate assistance, Sit to/from stand Toilet Transfer: +2 for physical assistance, Minimal assistance,  Stand-pivot Toileting- Clothing Manipulation and Hygiene: Minimal assistance, +2 for safety/equipment Functional mobility during ADLs: Minimal assistance, Rolling walker General ADL Comments: improved ability to complete stand pivot transfer, maintaining NWB status R foot. DOE.  Cognition: Cognition Overall Cognitive Status: Within Functional Limits for tasks assessed Orientation Level: Oriented X4 Cognition Arousal/Alertness: Awake/alert Behavior During Therapy: WFL for tasks assessed/performed Overall Cognitive Status: Within Functional Limits for tasks assessed   Blood pressure 130/68, pulse 76, temperature 98.7 F (37.1 C), temperature source Oral, resp. rate 18, height $RemoveBe'5\' 9"'IsVOBFumo$  (1.753 m), weight (!) 142.1 kg (313 lb 4.4 oz), SpO2 99 %. Physical Exam  Nursing note and vitals reviewed. Constitutional: He is oriented to person, place, and time. He appears well-developed and well-nourished.  obese  HENT:  Head: Normocephalic and atraumatic.  Mouth/Throat: Oropharynx is clear and moist.  Eyes: Conjunctivae and EOM are normal. Pupils are equal,  round, and reactive to light. Right eye exhibits no discharge.  Neck: Normal range of motion. Neck supple.  Cardiovascular: Normal rate and regular rhythm.   No murmur heard. Respiratory: Effort normal and breath sounds normal. No respiratory distress. He has no wheezes.  GI: Soft. Bowel sounds are normal. He exhibits no distension. There is no tenderness.  Musculoskeletal: He exhibits edema. He exhibits no tenderness.  1+ pitting edema right shin/foot. VAC in place on transmet site.   Neurological: He is alert and oriented to person, place, and time. No cranial nerve deficit. Coordination normal.  Alert and oriented. Follows all commands. UE 5/5. RLE: 3/5HF, KE -- did not test ankle due vac. LLE: 4/5 prox to distal. Senses pain in both lower ext but decreased LT distally LLE.  Skin: Skin is warm and dry. No erythema.  Vac dressing over the  right foot, sealed/intact  Psychiatric: He has a normal mood and affect. His behavior is normal. Judgment and thought content normal.    Lab Results Last 24 Hours       Results for orders placed or performed during the hospital encounter of 07/18/16 (from the past 24 hour(s))  Glucose, capillary     Status: Abnormal   Collection Time: 07/28/16  4:22 PM  Result Value Ref Range   Glucose-Capillary 120 (H) 65 - 99 mg/dL   Comment 1 Notify RN    Comment 2 Document in Chart   Glucose, capillary     Status: Abnormal   Collection Time: 07/28/16  8:48 PM  Result Value Ref Range   Glucose-Capillary 124 (H) 65 - 99 mg/dL   Comment 1 Notify RN   CBC     Status: Abnormal   Collection Time: 07/29/16  3:16 AM  Result Value Ref Range   WBC 10.9 (H) 4.0 - 10.5 K/uL   RBC 3.42 (L) 4.22 - 5.81 MIL/uL   Hemoglobin 9.2 (L) 13.0 - 17.0 g/dL   HCT 53.7 (L) 94.3 - 27.6 %   MCV 86.5 78.0 - 100.0 fL   MCH 26.9 26.0 - 34.0 pg   MCHC 31.1 30.0 - 36.0 g/dL   RDW 14.7 09.2 - 95.7 %   Platelets 317 150 - 400 K/uL  Heparin level (unfractionated)     Status: None   Collection Time: 07/29/16  3:16 AM  Result Value Ref Range   Heparin Unfractionated 0.38 0.30 - 0.70 IU/mL  Basic metabolic panel     Status: Abnormal   Collection Time: 07/29/16  3:16 AM  Result Value Ref Range   Sodium 140 135 - 145 mmol/L   Potassium 3.6 3.5 - 5.1 mmol/L   Chloride 104 101 - 111 mmol/L   CO2 26 22 - 32 mmol/L   Glucose, Bld 109 (H) 65 - 99 mg/dL   BUN 8 6 - 20 mg/dL   Creatinine, Ser 4.73 (H) 0.61 - 1.24 mg/dL   Calcium 8.7 (L) 8.9 - 10.3 mg/dL   GFR calc non Af Amer 46 (L) >60 mL/min   GFR calc Af Amer 53 (L) >60 mL/min   Anion gap 10 5 - 15  Glucose, capillary     Status: Abnormal   Collection Time: 07/29/16  6:02 AM  Result Value Ref Range   Glucose-Capillary 113 (H) 65 - 99 mg/dL   Comment 1 Notify RN   Glucose, capillary     Status: Abnormal   Collection Time: 07/29/16  11:28 AM  Result Value Ref Range   Glucose-Capillary 128 (H)  65 - 99 mg/dL     Imaging Results (Last 48 hours)  No results found.    Assessment/Plan:  Diagnosis: PAD requiring right transmetatarsal amputation 1. Does the need for close, 24 hr/day medical supervision in concert with the patient's rehab needs make it unreasonable for this patient to be served in a less intensive setting? Yes 2. Co-Morbidities requiring supervision/potential complications: morbid obesity, CAD, CKD, DM 1  3. Due to bladder management, bowel management, safety, skin/wound care, disease management, medication administration, pain management and patient education, does the patient require 24 hr/day rehab nursing? Yes 4. Does the patient require coordinated care of a physician, rehab nurse, PT (1-2 hrs/day, 5 days/week) and OT (1-2 hrs/day, 5 days/week) to address physical and functional deficits in the context of the above medical diagnosis(es)? Yes Addressing deficits in the following areas: balance, endurance, locomotion, strength, transferring, bowel/bladder control, bathing, dressing, feeding, grooming, toileting and psychosocial support 5. Can the patient actively participate in an intensive therapy program of at least 3 hrs of therapy per day at least 5 days per week? Yes 6. The potential for patient to make measurable gains while on inpatient rehab is excellent 7. Anticipated functional outcomes upon discharge from inpatient rehab are modified independent  with PT, modified independent with OT, n/a with SLP. 8. Estimated rehab length of stay to reach the above functional goals is: 7 days 9. Does the patient have adequate social supports and living environment to accommodate these discharge functional goals? Yes 10. Anticipated D/C setting: Home 11. Anticipated post D/C treatments: [name] therapy 12. Overall Rehab/Functional Prognosis: excellent  RECOMMENDATIONS: This patient's condition is appropriate for  continued rehabilitative care in the following setting: CIR Patient has agreed to participate in recommended program. Yes Note that insurance prior authorization may be required for reimbursement for recommended care.  Comment: Rehab Admissions Coordinator to follow up.  Thanks,  Meredith Staggers, MD, [facility]     07/29/2016    Revision History                             Routing History

## 2016-08-03 NOTE — Progress Notes (Signed)
Peripherally Inserted Central Catheter/Midline Placement  The IV Nurse has discussed with the patient and/or persons authorized to consent for the patient, the purpose of this procedure and the potential benefits and risks involved with this procedure.  The benefits include less needle sticks, lab draws from the catheter and patient may be discharged home with the catheter.  Risks include, but not limited to, infection, bleeding, blood clot (thrombus formation), and puncture of an artery; nerve damage and irregular heat beat.  Alternatives to this procedure were also discussed.  PICC/Midline Placement Documentation        Joshua [name] 08/03/2016, 4:12 PM

## 2016-08-04 ENCOUNTER — Inpatient Hospital Stay (HOSPITAL_COMMUNITY): Payer: BLUE CROSS/BLUE SHIELD | Admitting: Physical Therapy

## 2016-08-04 ENCOUNTER — Inpatient Hospital Stay (HOSPITAL_COMMUNITY): Payer: BLUE CROSS/BLUE SHIELD | Admitting: Occupational Therapy

## 2016-08-04 ENCOUNTER — Inpatient Hospital Stay (HOSPITAL_COMMUNITY): Payer: Self-pay | Admitting: Physical Therapy

## 2016-08-04 DIAGNOSIS — L0390 Cellulitis, unspecified: Secondary | ICD-10-CM

## 2016-08-04 DIAGNOSIS — E119 Type 2 diabetes mellitus without complications: Secondary | ICD-10-CM

## 2016-08-04 DIAGNOSIS — E1142 Type 2 diabetes mellitus with diabetic polyneuropathy: Secondary | ICD-10-CM

## 2016-08-04 DIAGNOSIS — L0291 Cutaneous abscess, unspecified: Secondary | ICD-10-CM

## 2016-08-04 DIAGNOSIS — I2510 Atherosclerotic heart disease of native coronary artery without angina pectoris: Secondary | ICD-10-CM

## 2016-08-04 DIAGNOSIS — I5042 Chronic combined systolic (congestive) and diastolic (congestive) heart failure: Secondary | ICD-10-CM

## 2016-08-04 DIAGNOSIS — Z89439 Acquired absence of unspecified foot: Secondary | ICD-10-CM

## 2016-08-04 DIAGNOSIS — I10 Essential (primary) hypertension: Secondary | ICD-10-CM

## 2016-08-04 DIAGNOSIS — N183 Chronic kidney disease, stage 3 (moderate): Secondary | ICD-10-CM

## 2016-08-04 DIAGNOSIS — E1149 Type 2 diabetes mellitus with other diabetic neurological complication: Secondary | ICD-10-CM

## 2016-08-04 DIAGNOSIS — I509 Heart failure, unspecified: Secondary | ICD-10-CM

## 2016-08-04 DIAGNOSIS — D62 Acute posthemorrhagic anemia: Secondary | ICD-10-CM

## 2016-08-04 LAB — COMPREHENSIVE METABOLIC PANEL
ALT: 16 U/L — ABNORMAL LOW (ref 17–63)
AST: 17 U/L (ref 15–41)
Albumin: 2.3 g/dL — ABNORMAL LOW (ref 3.5–5.0)
Alkaline Phosphatase: 114 U/L (ref 38–126)
Anion gap: 7 (ref 5–15)
BUN: 18 mg/dL (ref 6–20)
CO2: 26 mmol/L (ref 22–32)
Calcium: 8.5 mg/dL — ABNORMAL LOW (ref 8.9–10.3)
Chloride: 103 mmol/L (ref 101–111)
Creatinine, Ser: 1.8 mg/dL — ABNORMAL HIGH (ref 0.61–1.24)
GFR calc Af Amer: 46 mL/min — ABNORMAL LOW (ref >60)
GFR calc non Af Amer: 40 mL/min — ABNORMAL LOW (ref >60)
Glucose, Bld: 221 mg/dL — ABNORMAL HIGH (ref 65–99)
Potassium: 3.9 mmol/L (ref 3.5–5.1)
Sodium: 136 mmol/L (ref 135–145)
Total Bilirubin: 0.3 mg/dL (ref 0.3–1.2)
Total Protein: 7.3 g/dL (ref 6.5–8.1)

## 2016-08-04 LAB — CBC WITH DIFFERENTIAL/PLATELET
Basophils Absolute: 0 10*3/uL (ref 0.0–0.1)
Basophils Relative: 0 %
Eosinophils Absolute: 0.1 10*3/uL (ref 0.0–0.7)
Eosinophils Relative: 1 %
HCT: 28.9 % — ABNORMAL LOW (ref 39.0–52.0)
Hemoglobin: 8.9 g/dL — ABNORMAL LOW (ref 13.0–17.0)
Lymphocytes Relative: 20 %
Lymphs Abs: 1.6 10*3/uL (ref 0.7–4.0)
MCH: 26.9 pg (ref 26.0–34.0)
MCHC: 30.8 g/dL (ref 30.0–36.0)
MCV: 87.3 fL (ref 78.0–100.0)
Monocytes Absolute: 0.7 10*3/uL (ref 0.1–1.0)
Monocytes Relative: 8 %
Neutro Abs: 5.7 10*3/uL (ref 1.7–7.7)
Neutrophils Relative %: 71 %
Platelets: 308 10*3/uL (ref 150–400)
RBC: 3.31 MIL/uL — ABNORMAL LOW (ref 4.22–5.81)
RDW: 14.7 % (ref 11.5–15.5)
WBC: 8.2 10*3/uL (ref 4.0–10.5)

## 2016-08-04 LAB — GLUCOSE, CAPILLARY
Glucose-Capillary: 272 mg/dL — ABNORMAL HIGH (ref 65–99)
Glucose-Capillary: 205 mg/dL — ABNORMAL HIGH (ref 65–99)
Glucose-Capillary: 205 mg/dL — ABNORMAL HIGH (ref 65–99)
Glucose-Capillary: 127 mg/dL — ABNORMAL HIGH (ref 65–99)

## 2016-08-04 NOTE — Evaluation (Signed)
Physical Therapy Assessment and Plan  Patient Details  Name: Joshua [name] MRN: [id number] Date of Birth: 1956-05-21  PT Diagnosis: Difficulty walking, Muscle weakness and Pain in RLE Rehab Potential: Good ELOS: 7-10   Today's Date: 08/04/2016 PT Individual Time: 0905-1005 PT Individual Time Calculation (min): 60 min     Problem List:  Patient Active Problem List   Diagnosis Date Noted  . S/P angioplasty with stent 07/29/16 DES to LAD ostium to prox vessel overlapping previously placed stents; DES 2nd OM 08/03/2016  . Status post transmetatarsal amputation of right foot ([place]) 08/03/2016  . Physical debility 08/03/2016  . S/P transmetatarsal amputation of foot ([place]) 08/03/2016  . Acute on chronic combined systolic and diastolic CHF, NYHA class 3 ([place])   . Chronic osteomyelitis of right foot ([place])   . Cellulitis and abscess of leg, except foot   . Penetrating wound of right foot   . Type 1 diabetes mellitus with right diabetic foot ulcer ([place])   . Chronic osteomyelitis of toe of right foot ([place])   . CKD (chronic kidney disease) stage 3, GFR 30-59 ml/min   . Renal insufficiency   . Unstable angina pectoris ([place]) 07/18/2016  . Type II diabetes mellitus ([place])   . Chronic combined systolic and diastolic CHF (congestive heart failure) ([place])   . Coronary artery disease   . Hyperlipemia   . Coronary artery disease involving native coronary artery of native heart with unstable angina pectoris ([place])   . Angina pectoris ([place])   . Intermediate coronary syndrome ([place]) 07/03/2014  . Chest pain with moderate risk of acute coronary syndrome 06/24/2014  . CAD in native artery 06/24/2014  . Sleep apnea- on C-pap 06/24/2014  . Morbid obesity ([place]) 11/10/2013  . Uncontrolled diabetes mellitus with peripheral autonomic neuropathy ([place]) 10/29/2013  . Poorly controlled diabetes mellitus ([place]) 10/29/2013  . Hyperglycemia 10/29/2013  . Presence of stent in anterior descending branch of left  coronary artery 10/31/2011  . Hyperlipidemia 01/12/2011  . Essential hypertension 01/12/2011  . GERD 01/12/2011    Past Medical History:  Past Medical History:  Diagnosis Date  . Chronic combined systolic and diastolic CHF (congestive heart failure) ([place])    a. 07/2015 Echo: EF 45-50%, mod LVH, mid apical/antsept AK, Gr 1 DD.  [name] Coronary artery disease    a. 2012 s/p MI/PCI: s/p DES to mid/dist RCA and overlapping DES to LAD;  b. 07/2015 Cath: LAD 10% ISR, D1 40(jailed), LCX 42m OM2 30, OM3 30, RCA 551mSR, 10d ISR.  . Diabetic gastroparesis ([facility] [place]  . Essential hypertension   . GERD (gastroesophageal reflux disease)   . Gout   . Heart murmur   . Hyperlipemia   . Morbid obesity (HCL[place]  . Myocardial infarction (HCHaviland2012  . OSA on CPAP   . Polyneuropathy in diabetes(357.2)   . Pulmonary sarcoidosis ([place]CV[place]   "problems w/it years ago; no problems anymore" (07/03/2014)  . Status post transmetatarsal amputation of right foot (HCPoint [place]8/22/2017  . Type II diabetes mellitus ([place]CF[place]   Past Surgical History:  Past Surgical History:  Procedure Laterality Date  . AMPUTATION Right 07/24/2016   Procedure: TRANSMETATARSAL FOOT AMPUTATION;  Surgeon: MaNewt MinionMD;  [facility] Service: [facility]ics;  Laterality: Right;  . BREAST LUMPECTOMY Right ~ 2000   "benign"  . CARDIAC CATHETERIZATION N/A 07/30/2015   Procedure: Left Heart Cath and Coronary Angiography;  Surgeon: ChBurnell BlanksMD;  [facility];  Service: Cardiovascular;  Laterality: N/A;  . CARDIAC CATHETERIZATION N/A 07/19/2016   Procedure: Left Heart Cath and Coronary Angiography;  Surgeon: Belva Crome, MD;  Location: [place] [facility];  Service: Cardiovascular;  Laterality: N/A;  . CARDIAC CATHETERIZATION N/A 07/29/2016   Procedure: Coronary Stent Intervention;  Surgeon: Belva Crome, MD;  Location: [place] [facility] LAB;  Service: Cardiovascular;  Laterality: N/A;  . CARDIAC CATHETERIZATION N/A 07/29/2016    Procedure: Intravascular Ultrasound/IVUS;  Surgeon: Belva Crome, MD;  Location: [place] [facility] LAB;  Service: Cardiovascular;  Laterality: N/A;  . CATARACT EXTRACTION W/ INTRAOCULAR LENS  IMPLANT, BILATERAL Bilateral 2014-2015  . CORONARY ANGIOPLASTY WITH STENT PLACEMENT  10/31/2011   30 % MID ATRIOVENTRICULAR GROOVE CX STENOSIS. 90 % MID RCA.PTA TO LAD/STENTING OF TANDEM 60-70%. LAD STENOSIS 99% REDUCED TO 0%.3.0 X 32MM PROMUS DES POSTDILATED TO 3.25MM.  [name] CORONARY ANGIOPLASTY WITH STENT PLACEMENT  07/03/2014   "2"  . I&D EXTREMITY Right 10/30/2013   Procedure: IRRIGATION AND DEBRIDEMENT RIGHT SMALL FINGER POSSIBLE SECONDARY WOUND CLOSURE;  Surgeon: [name], MD;  Location: [facility];  Service: Orthopedics;  Laterality: Right;  [name] is available after 4pm  . I&D EXTREMITY Right 11/01/2013   Procedure:  IRRIGATION AND DEBRIDEMENT RIGHT  PROXIMAL PHALANGEAL LEVEL AMPUTATION;  Surgeon: [name], MD;  Location: [facility];  Service: Orthopedics;  Laterality: Right;  . INCISION AND DRAINAGE Right 10/28/2013   Procedure: INCISION AND DRAINAGE Right small finger;  Surgeon: [name], MD;  Location: [facility];  Service: Orthopedics;  Laterality: Right;  . LEFT HEART CATH Left 11/03/2011   Procedure: LEFT HEART CATH;  Surgeon: [name], MD;  [facility] LAB;  Service: Cardiovascular;  Laterality: Left;  . LEFT HEART CATHETERIZATION WITH CORONARY ANGIOGRAM N/A 07/03/2014   Procedure: LEFT HEART CATHETERIZATION WITH CORONARY ANGIOGRAM;  Surgeon: [name], MD;  Location: [facility] CATH LAB;  Service: Cardiovascular;  Laterality: N/A;  . PERCUTANEOUS CORONARY STENT INTERVENTION (PCI-S) Left 11/03/2011   Procedure: PERCUTANEOUS CORONARY STENT INTERVENTION (PCI-S);  Surgeon: [name], MD;  Location: [facility] CATH LAB;  Service: Cardiovascular;  Laterality: Left;  . TOE AMPUTATION Right ~ 2010   "2nd toe"  . TOE AMPUTATION Right 2012   "3rd toe"    Assessment &  Plan Clinical Impression: Joshua [name] is a 60 y.o. male with history of CAD, T2DM with gastroparesis,Chronic renal insufficiency with creatinine 1.60-1.78, chronic diabetic foot ulcer and polyneuropathy, morbid obesity, pulmonary sarcoidosis.  Admitted on 07/18/16 with unstable angina and ACS. He was placed on IV heparin/IV NTG and cardiac cath revealed 3 vessel disease with normal LVF.  Dr. Servando Snare recommended CABG but patient declined and elected PCI.  He was found to have abscess in plantar soft tissue at distal phalanx with osteomyelitis of right great toe. Dr. Tommy Medal consulted and recommended amputation .  PCI deferred till infection under control and he underwent  Right great toe trans metartarsal amputation on 08/12 by Dr. Sharol Given. Placed on intravenous TEFLARO 07/25/2016 3 weeks. Prevena VAC placed and patient to be NWB RLE. VAC removed 08/03/16.He was started on IV antibiotics and 2 weeks recommended for treatment per ID. Subcutaneous heparin for DVT prophylaxis.  He was hydrated for management of acute renal failure and underwent cardiac cath with PCI 07/28/2016 and maintained on aspirin as well as Brilinta. Patient transferred to CIR on 08/03/2016 .   Patient currently requires mod with mobility secondary to muscle weakness, decreased  cardiorespiratoy endurance and decreased standing balance, decreased postural control and decreased balance strategies.  Prior to hospitalization, patient was independent  with mobility and lived with Spouse, Family in a House home.  Home access is 1 step onto landing, 1 step onto porch (4" or less each)Stairs to enter.  Patient will benefit from skilled PT intervention to maximize safe functional mobility, minimize fall risk and decrease caregiver burden for planned discharge home with intermittent assist.  Anticipate patient will benefit from follow up [facility] at discharge.  PT - End of Session Activity Tolerance: Tolerates 30+ min activity with multiple  rests Endurance Deficit: Yes PT Assessment Rehab Potential (ACUTE/IP ONLY): Good Barriers to Discharge: Inaccessible home environment PT Patient demonstrates impairments in the following area(s): Balance;Endurance;Motor;Safety PT Transfers Functional Problem(s): Bed Mobility;Car;Bed to Chair;Furniture;Floor PT Locomotion Functional Problem(s): Stairs;Wheelchair Mobility;Ambulation PT Plan PT Intensity: Minimum of 1-2 x/day ,45 to 90 minutes PT Frequency: 5 out of 7 days PT Duration Estimated Length of Stay: 7-10 PT Treatment/Interventions: Ambulation/gait training;Discharge planning;Functional mobility training;Therapeutic Activities;Wheelchair propulsion/positioning;Therapeutic Exercise;Neuromuscular re-education;Balance/vestibular training;DME/adaptive equipment instruction;UE/LE Strength taining/ROM;Community reintegration;Patient/family education;Stair training;UE/LE Coordination activities PT Transfers Anticipated Outcome(s): mod I PT Locomotion Anticipated Outcome(s): mod I in household with RW, mod I in community with LRAD PT Recommendation Follow Up Recommendations: Home health PT Patient destination: Home Equipment Recommended: Wheelchair (measurements);Wheelchair cushion (measurements);Rolling walker with 5" wheels Equipment Details: 18x18 w/c with R ELR and basic cushion  Skilled Therapeutic Intervention Pt received resting in recliner with no c/o pain and agreeable to therapy session.  PT provided pt education on role of PT, goals, ELOS, POC, safety plan, and initiated d/c planning.  PT instructed pt in sit<>stand with RW, stand/pivot with RW, and squat/pivot transfers with min assist.  Pt requires mod assist for ambulatory transfers when turning and backing up to chair.  Gait training 2x10' with RW and min assist, +70' with RW and overall min assist.  PT instructed pt in negotiation of 8 steps with 2 rails and min assist with mod verbal cues for sequencing.  Car transfer with min  assist and mod verbal cues for sequencing.  Pt propels w/c throughout unit, max distance 300', with supervision.  Pt returned to room at end of session and left upright in w/c with call bell in reach and needs met.    PT Evaluation Precautions/Restrictions Precautions Precautions: Fall Precaution Comments: recent PCI for unstable angina, declining CABG Restrictions Weight Bearing Restrictions: Yes RLE Weight Bearing: Non weight bearing Other Position/Activity Restrictions: able to maintain during ambulation   Pain Pain Assessment Pain Assessment: No/denies pain Home Living/Prior Functioning Home Living Available Help at Discharge: Family;Available 24 hours/day Type of Home: House Home Access: Stairs to enter [employer] of Steps: 1 step onto landing, 1 step onto porch (4" or less each) Entrance Stairs-Rails: None Home Layout: Other (Comment) (sunken living room)  Lives With: Spouse;Family Prior Function Level of Independence: Independent with transfers;Independent with gait  Able to Take Stairs?: Yes Driving: Yes Vocation: Other (comment) (was supposed to start a job 1 week ago, but unsure whether he will still be able to start with them once able to go back to work) Leisure: Hobbies-yes (Comment) Comments: likes to act and sing  Cognition Overall Cognitive Status: Within Functional Limits for tasks assessed Arousal/Alertness: Awake/alert Orientation Level: Oriented X4 Problem Solving: Appears intact Safety/Judgment: Other (comment) Comments: mildly impulsive with sit<>stand Sensation Sensation Light Touch: Appears Intact Coordination Gross Motor Movements are Fluid and Coordinated: No Fine Motor Movements are Fluid and Coordinated:  Yes Motor  Motor Motor: Abnormal postural alignment and control Motor - Skilled Clinical Observations: decreased balance likely 2/2 to NWB on RLE  Mobility Bed Mobility Bed Mobility: Not assessed (pt in chair on  arrival) Transfers Transfers: Yes Sit to Stand: 4: Min guard Stand to Sit: 4: Min guard Locomotion  Ambulation Ambulation: Yes Ambulation/Gait Assistance: 4: Min assist Ambulation Distance (Feet): 55 Feet Assistive device: Rolling walker Ambulation/Gait Assistance Details: Verbal cues for gait pattern;Verbal cues for sequencing;Verbal cues for technique;Verbal cues for precautions/safety;Verbal cues for safe use of DME/AE Gait Gait velocity: decreased Stairs / Additional Locomotion Stairs: Yes Stairs Assistance: 4: Min assist Stair Management Technique: Two rails Number of Stairs: 8 Height of Stairs: 3 Wheelchair Mobility Wheelchair Mobility: Yes Wheelchair Assistance: 5: Careers information officer: Both upper extremities Wheelchair Parts Management: Needs assistance Distance: 300   Balance Balance Balance Assessed: Yes Static Sitting Balance Static Sitting - Balance Support: No upper extremity supported Static Sitting - Level of Assistance: 7: Independent Dynamic Sitting Balance Dynamic Sitting - Balance Support: No upper extremity supported;During functional activity Dynamic Sitting - Level of Assistance: 6: Modified independent (Device/Increase time) Static Standing Balance Static Standing - Balance Support: Bilateral upper extremity supported Static Standing - Level of Assistance: 5: Stand by assistance Dynamic Standing Balance Dynamic Standing - Balance Support: Left upper extremity supported Dynamic Standing - Level of Assistance: 3: Mod assist;4: Min assist Extremity Assessment      RLE Assessment RLE Assessment: Within Functional Limits RLE AROM (degrees) RLE Overall AROM Comments: WFL at hip and knee, ankle not assessed due to surgery RLE Strength Right Hip Flexion: 3+/5 (painful) Right Knee Flexion: 5/5 Right Knee Extension: 5/5 LLE Assessment LLE Assessment: Within Functional Limits LLE AROM (degrees) LLE Overall AROM Comments: WFL assessed in  sitting LLE Strength Left Hip Flexion: 4+/5 Left Knee Flexion: 5/5 Left Knee Extension: 5/5 Left Ankle Dorsiflexion: 3+/5 Left Ankle Plantar Flexion: 3+/5   See Function Navigator for Current Functional Status.   Refer to Care Plan for Long Term Goals  Recommendations for other services: None  Discharge Criteria: Patient will be discharged from PT if patient refuses treatment 3 consecutive times without medical reason, if treatment goals not met, if there is a change in medical status, if patient makes no progress towards goals or if patient is discharged from hospital.  The above assessment, treatment plan, treatment alternatives and goals were discussed and mutually agreed upon: by patient  Urban Gibson E Penven-Crew 08/04/2016, 11:05 AM

## 2016-08-04 NOTE — Progress Notes (Signed)
Patient placed on hospital CPAP machine with no complications. 

## 2016-08-04 NOTE — Progress Notes (Signed)
Patient information reviewed and entered into eRehab system by Illa Enlow, RN, CRRN, PPS Coordinator.  Information including medical coding and functional independence measure will be reviewed and updated through discharge.    

## 2016-08-04 NOTE — IPOC Note (Signed)
Overall Plan of Care [facility]) Patient Details Name: Joshua [name] MRN: [id number] DOB: 04-30-1956  Admitting Diagnosis: [facility] Problems: Active Problems:   S/P transmetatarsal amputation of foot ([place])   Acute blood loss anemia   DM type 2 with diabetic peripheral neuropathy (HCC)   Cellulitis and abscess     Functional Problem List: Nursing Bladder, Bowel, Edema, Endurance, Medication Management, Pain, Nutrition, Safety, Skin Integrity  PT Balance, Endurance, Motor, Safety  OT Balance, Endurance, Motor, Safety  SLP    TR         Basic ADL's: OT Grooming, Bathing, Dressing, Toileting     Advanced  ADL's: OT Simple Meal Preparation     Transfers: PT Bed Mobility, Car, Bed to Chair, [employer], Floor  OT Toilet, Tub/Shower     Locomotion: PT Stairs, Emergency planning/management officer, Ambulation     Additional Impairments: OT None  SLP        TR      Anticipated Outcomes Item Anticipated Outcome  Self Feeding n/a  Swallowing      Basic self-care  mod I - S  Toileting  mod I    Bathroom Transfers mod I - toilet, supervision - shower  Bowel/Bladder  continent of bowel and bladder with min assist  Transfers  mod I  Locomotion  mod I in household with RW, mod I in community with LRAD  Communication     Cognition     Pain  pain less than or equal to 4/10 with min assist  Safety/Judgment  no new injury or skin breakdown and displaying sound safety judgement    Therapy Plan: PT Intensity: Minimum of 1-2 x/day ,45 to 90 minutes PT Frequency: 5 out of 7 days PT Duration Estimated Length of Stay: 7-10 OT Intensity: Minimum of 1-2 x/day, 45 to 90 minutes OT Frequency: 5 out of 7 days OT Duration/Estimated Length of Stay: 7-10 days         Team Interventions: Nursing Interventions Patient/Family Education, Bladder Management, Bowel Management, Disease Management/Prevention, Pain Management, Medication Management, Skin Care/Wound Management, Discharge  Planning  PT interventions Ambulation/gait training, Discharge planning, Functional mobility training, Therapeutic Activities, Wheelchair propulsion/positioning, Therapeutic Exercise, Neuromuscular re-education, Training and development officer, DME/adaptive equipment instruction, UE/LE Strength taining/ROM, Community reintegration, Barrister's clerk education, IT trainer, UE/LE Coordination activities  OT Interventions Training and development officer, Academic librarian, Discharge planning, Psychosocial support, Therapeutic Activities, UE/LE Coordination activities, Patient/family education, Engineer, drilling, Skin care/wound managment, UE/LE Strength taining/ROM, Wheelchair propulsion/positioning, Therapeutic Exercise, Self Care/advanced ADL retraining  SLP Interventions    TR Interventions    SW/CM Interventions Discharge Planning, Barrister's clerk, Patient/Family Education    Team Discharge Planning: Destination: PT-Home ,OT- Home , SLP-  Projected Follow-up: PT-Home health PT, OT-  Home health OT, SLP-  Projected Equipment Needs: PT-Wheelchair (measurements), Wheelchair cushion (measurements), Rolling walker with 5" wheels, OT- Tub/shower bench, SLP-  Equipment Details: PT-18x18 w/c with R ELR and basic cushion, OT-  Patient/family involved in discharge planning: PT- Patient,  OT-Patient, SLP-   MD ELOS: 7-10 days. Medical Rehab Prognosis:  Good Assessment: 60 y.o.malewith history of CAD, T2DM with gastroparesis,Chronic renal insufficiency with creatinine 1.60-1.78, chronic diabetic foot ulcer and polyneuropathy, morbid obesity, pulmonary sarcoidosis. Patient lives with wife and 3 adult children. Wife works during the day. One level home with one-step entry. Admitted on 07/18/16 with unstable angina and ACS. He was placed on IV heparin/IV NTG and cardiac cath revealed 3 vessel disease with normal LVF. Dr. Servando [name] recommended CABG but patient declined and  elected PCI. He  was found to have abscess in plantar soft tissue at distal phalanx with osteomyelitis of right great toe. Dr. Tommy [name] consulted and recommended amputation . PCI deferred till infection under control and he underwent Right great toe trans metartarsal amputation on 08/12 by Dr. Sharol [name]. Placed on intravenous TEFLARO 07/25/2016 3 weeks. Prevena VAC placed and patient to be NWB RLE. He was started on IV antibiotics and 2 weeks recommended for treatment per ID. Wound VAC was later discontinued 08/03/2016. He was hydrated for management of acute renal failure and underwent cardiac cath with PCI 07/28/2016 and maintained on aspirin as well as Brilinta. Pt with resulting functional deficits with ambulation [name] NWB status, mobility, safety, and endurance.  Will set goals for Mod I with therapies.   See Team Conference Notes for weekly updates to the plan of care
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
## 2016-08-04 NOTE — Plan of Care (Signed)
Problem: RH Eating Goal: LTG Patient will perform eating w/assist, cues/equip (OT) LTG: Patient will perform eating with assist, with/without cues using equipment (OT) Outcome: Not Applicable Date Met: 22/56/72 error

## 2016-08-04 NOTE — Evaluation (Signed)
Occupational Therapy Assessment and Plan  Patient Details  Name: Joshua [name] MRN: [id number] Date of Birth: 02/27/1956  OT Diagnosis: acute pain and muscle weakness (generalized) Rehab Potential: Rehab Potential (ACUTE ONLY): Fair ELOS: 7-10 days   Today's Date: 08/04/2016 OT Individual Time: 9629-5284 and 1045-1130 OT Individual Time Calculation (min): 72 min and 45 min      Problem List: Patient Active Problem List   Diagnosis Date Noted  . Acute blood loss anemia   . DM type 2 with diabetic peripheral neuropathy ([place])   . Cellulitis and abscess   . S/P angioplasty with stent 07/29/16 DES to LAD ostium to prox vessel overlapping previously placed stents; DES 2nd OM 08/03/2016  . Status post transmetatarsal amputation of right foot ([place]) 08/03/2016  . Physical debility 08/03/2016  . S/P transmetatarsal amputation of foot ([place]) 08/03/2016  . Acute on chronic combined systolic and diastolic CHF, NYHA class 3 ([place])   . Chronic osteomyelitis of right foot ([place])   . Cellulitis and abscess of leg, except foot   . Penetrating wound of right foot   . Type 1 diabetes mellitus with right diabetic foot ulcer ([place])   . Chronic osteomyelitis of toe of right foot ([place])   . CKD (chronic kidney disease) stage 3, GFR 30-59 ml/min   . CKD (chronic kidney disease)   . Unstable angina pectoris ([place]) 07/18/2016  . Type II diabetes mellitus ([place])   . Chronic combined systolic and diastolic congestive heart failure ([place])   . Coronary artery disease   . Hyperlipemia   . Coronary artery disease involving native coronary artery of native heart without angina pectoris   . Angina pectoris ([place])   . Intermediate coronary syndrome ([place]) 07/03/2014  . Chest pain with moderate risk of acute coronary syndrome 06/24/2014  . CAD in native artery 06/24/2014  . Sleep apnea- on C-pap 06/24/2014  . Morbid obesity due to excess calories ([place]) 11/10/2013  . Uncontrolled diabetes mellitus with peripheral  autonomic neuropathy ([place]) 10/29/2013  . Poorly controlled diabetes mellitus ([place]) 10/29/2013  . Hyperglycemia 10/29/2013  . Presence of stent in anterior descending branch of left coronary artery 10/31/2011  . Hyperlipidemia 01/12/2011  . Benign essential HTN 01/12/2011  . GERD 01/12/2011    Past Medical History:  Past Medical History:  Diagnosis Date  . Chronic combined systolic and diastolic CHF (congestive heart failure) ([place])    a. 07/2015 Echo: EF 45-50%, mod LVH, mid apical/antsept AK, Gr 1 DD.  [name] Coronary artery disease    a. 2012 s/p MI/PCI: s/p DES to mid/dist RCA and overlapping DES to LAD;  b. 07/2015 Cath: LAD 10% ISR, D1 40(jailed), LCX 91m OM2 30, OM3 30, RCA 513mSR, 10d ISR.  . Diabetic gastroparesis (HCRacine  . Essential hypertension   . GERD (gastroesophageal reflux disease)   . Gout   . Heart murmur   . Hyperlipemia   . Morbid obesity ([facility]  . Myocardial infarction (HC[facility]bur2012  . OSA on CPAP   . Polyneuropathy in diabetes(357.2)   . Pulmonary sarcoidosis ([place]   "problems w/it years ago; no problems anymore" (07/03/2014)  . Status post transmetatarsal amputation of right foot (HC[facility]view8/22/2017  . Type II diabetes mellitus ([facility]oun[place]   Past Surgical History:  Past Surgical History:  Procedure Laterality Date  . AMPUTATION Right 07/24/2016   Procedure: TRANSMETATARSAL FOOT AMPUTATION;  Surgeon: MaNewt MinionMD;  [facility] Service: [facility]ics;  Laterality: Right;  .  BREAST LUMPECTOMY Right ~ 2000   "benign"  . CARDIAC CATHETERIZATION N/A 07/30/2015   Procedure: Left Heart Cath and Coronary Angiography;  Surgeon: Burnell Blanks, MD;  Location: [place] [facility];  Service: Cardiovascular;  Laterality: N/A;  . CARDIAC CATHETERIZATION N/A 07/19/2016   Procedure: Left Heart Cath and Coronary Angiography;  Surgeon: Belva Crome, MD;  Location: [place] [facility];  Service: Cardiovascular;  Laterality: N/A;  . CARDIAC CATHETERIZATION N/A 07/29/2016    Procedure: Coronary Stent Intervention;  Surgeon: Belva Crome, MD;  Location: [place] [facility] LAB;  Service: Cardiovascular;  Laterality: N/A;  . CARDIAC CATHETERIZATION N/A 07/29/2016   Procedure: Intravascular Ultrasound/IVUS;  Surgeon: Belva Crome, MD;  Location: [place] [facility];  Service: Cardiovascular;  Laterality: N/A;  . CATARACT EXTRACTION W/ INTRAOCULAR LENS  IMPLANT, BILATERAL Bilateral 2014-2015  . CORONARY ANGIOPLASTY WITH STENT PLACEMENT  10/31/2011   30 % MID ATRIOVENTRICULAR GROOVE CX STENOSIS. 90 % MID RCA.PTA TO LAD/STENTING OF TANDEM 60-70%. LAD STENOSIS 99% REDUCED TO 0%.3.0 X 32MM PROMUS DES POSTDILATED TO 3.25MM.  [name] CORONARY ANGIOPLASTY WITH STENT PLACEMENT  07/03/2014   "2"  . I&D EXTREMITY Right 10/30/2013   Procedure: IRRIGATION AND DEBRIDEMENT RIGHT SMALL FINGER POSSIBLE SECONDARY WOUND CLOSURE;  Surgeon: [name], MD;  Location: [facility];  Service: Orthopedics;  Laterality: Right;  [name] is available after 4pm  . I&D EXTREMITY Right 11/01/2013   Procedure:  IRRIGATION AND DEBRIDEMENT RIGHT  PROXIMAL PHALANGEAL LEVEL AMPUTATION;  Surgeon: [name], MD;  Location: [facility];  Service: Orthopedics;  Laterality: Right;  . INCISION AND DRAINAGE Right 10/28/2013   Procedure: INCISION AND DRAINAGE Right small finger;  Surgeon: [name], MD;  Location: [facility];  Service: Orthopedics;  Laterality: Right;  . LEFT HEART CATH Left 11/03/2011   Procedure: LEFT HEART CATH;  Surgeon: [name], MD;  Location: [facility] CATH LAB;  Service: Cardiovascular;  Laterality: Left;  . LEFT HEART CATHETERIZATION WITH CORONARY ANGIOGRAM N/A 07/03/2014   Procedure: LEFT HEART CATHETERIZATION WITH CORONARY ANGIOGRAM;  Surgeon: [name], MD;  Location: [facility] CATH LAB;  Service: Cardiovascular;  Laterality: N/A;  . PERCUTANEOUS CORONARY STENT INTERVENTION (PCI-S) Left 11/03/2011   Procedure: PERCUTANEOUS CORONARY STENT INTERVENTION (PCI-S);  Surgeon: [name],  MD;  Location[facility] CATH LAB;  Service: Cardiovascular;  Laterality: Left;  . TOE AMPUTATION Right ~ 2010   "2nd toe"  . TOE AMPUTATION Right 2012   "3rd toe"    Assessment & Plan Clinical Impression: Patient is a 60 y.o. year old male with history of CAD, T2DM with gastroparesis,Chronic renal insufficiency with creatinine 1.60-1.78, chronic diabetic foot ulcer and polyneuropathy, morbid obesity, pulmonary sarcoidosis. Per chart review patient lives with wife and 3 adult children. Wife works during the day. One level home with one-step entry. Admitted on 07/18/16 with unstable angina and ACS. He was placed on IV heparin/IV NTG and cardiac cath revealed 3 vessel disease with normal LVF. Dr. Servando Snare recommended CABG but patient declined and elected PCI. He was found to have abscess in plantar soft tissue at distal phalanx with osteomyelitis of right great toe. Dr. Tommy Medal consulted and recommended amputation . PCI deferred till infection under control and he underwent Right great toe trans metartarsal amputation on 08/12 by Dr. Sharol Given. Placed on intravenous TEFLARO 07/25/2016 3 weeks. Prevena VAC placed and patient to be NWB RLE. He was started on IV antibiotics and 2 weeks recommended for treatment per  ID.Wound VAC was later discontinued 08/03/2016. Subcutaneous heparin for DVT prophylaxis. He was hydrated for management of acute renal failure and underwent cardiac cath with PCI 07/28/2016 and maintained on aspirin as well as Brilinta. PT/OT evaluations done and CIR recommended for follow up therapy. Patient was admitted for a comprehensive rehabilitation program .  Patient transferred to CIR on 08/03/2016 .    Patient currently requires min - mod A with basic self-care skills and IADL secondary to muscle weakness, decreased cardiorespiratoy endurance and decreased sitting balance, decreased standing balance and decreased balance strategies.  Prior to hospitalization, patient could complete ADls and IADLs  with modified independent .  Patient will benefit from skilled intervention to increase independence with basic self-care skills prior to discharge home with care partner.  Anticipate patient will require intermittent supervision and follow up home health.  OT - End of Session Activity Tolerance: Decreased this session Endurance Deficit: Yes Endurance Deficit Description: multiple rest breaks secondary to fatigue OT Assessment Rehab Potential (ACUTE ONLY): Fair OT Patient demonstrates impairments in the following area(s): Balance;Endurance;Motor;Safety OT Basic ADL's Functional Problem(s): Grooming;Bathing;Dressing;Toileting OT Advanced ADL's Functional Problem(s): Simple Meal Preparation OT Transfers Functional Problem(s): Toilet;Tub/Shower OT Additional Impairment(s): None OT Plan OT Intensity: Minimum of 1-2 x/day, 45 to 90 minutes OT Frequency: 5 out of 7 days OT Duration/Estimated Length of Stay: 7-10 days OT Treatment/Interventions: Balance/vestibular training;Community reintegration;Discharge planning;Psychosocial support;Therapeutic Activities;UE/LE Coordination activities;Patient/family education;DME/adaptive equipment instruction;Skin care/wound managment;UE/LE Strength taining/ROM;Wheelchair propulsion/positioning;Therapeutic Exercise;Self Care/advanced ADL retraining OT Self Feeding Anticipated Outcome(s): n/a OT Basic Self-Care Anticipated Outcome(s): mod I - S OT Toileting Anticipated Outcome(s): mod I  OT Bathroom Transfers Anticipated Outcome(s): mod I - toilet, supervision - shower OT Recommendation Patient destination: Home Follow Up Recommendations: Home health OT Equipment Recommended: Tub/shower bench   Skilled Therapeutic Intervention  Session 1: Upon entering the room, pt seated on EOB with no c/o pain. Pt maintained NWB restriction for R LE throughout session. Pt performed sit <> stand from bed with boosting assist. Pt hopped 10' with RW and steady assist into  bathroom for toileting. Pt utilized grab bar for stand pivot onto toilet with min A for balance during clothing management. Pt performed lateral leans for hygiene with steady assistance. Pt engaged in bathing at shower level while seated on TTB. Pt crossing B ankles over to opposite knee in order to wash LB. Pt performed lateral leans to wash buttocks and peri area with steady assist. Pt dressed from EOB with set up and steady assist for LB clothing management. Pt ambulated with RW to sit in recliner chair at end of session with steady assist. Call bell and all needed items within reach upon exiting the room.   Session 2: Upon entering the room, pt seated in wheelchair with no c/o pain. Skilled OT intervention with focus on therapeutic exercises for B UE strengthening and education for pursed lip breathing. OT demonstrated wheelchair push ups with pt returning demonstration for 2 sets of 10 this session. Pt then performed bicep curls, alternating punches, and straight arm raises with 7lbs hand weights in each hand x 2 sets of 10 reps. Pt also performed 10 reps of tricep extension exercises with 7 lbs resistance. Pt needing min cues for pursed lip breathing. Rest breaks needed secondary to fatigue. Pt remained seated in wheelchair wt end of session with call bell and all needed items within reach upon exiting the room.   OT Evaluation Precautions/Restrictions  Precautions Precautions: Fall Precaution Comments: recent PCI for unstable angina, declining  CABG Restrictions Weight Bearing Restrictions: Yes RUE Weight Bearing: Weight bearing as tolerated RLE Weight Bearing: Non weight bearing Other Position/Activity Restrictions: able to maintain during ambulation Pain Pain Assessment Pain Assessment: No/denies pain Home Living/Prior Functioning Home Living Available Help at Discharge: Family, Available 24 hours/day Type of Home: House Home Access: Stairs to enter [employer] of Steps: 1  step onto landing, 1 step onto porch (4" or less each) Entrance Stairs-Rails: None Home Layout: Other (Comment) (step down living room) Bathroom Shower/Tub: Tub/shower unit, Multimedia programmer: Handicapped height Bathroom Accessibility: Yes  Lives With: Spouse, Family Prior Function Level of Independence: Independent with transfers, Independent with gait  Able to Take Stairs?: Yes Driving: Yes Vocation: Other (comment) (was supposed to start a job 1 week ago, but unsure whether he will still be able to start with them once able to go back to work) Leisure: Hobbies-yes (Comment) Comments: likes to act and sing Vision/Perception  Vision- History Baseline Vision/History:  (B cataract surgery a few years ago) Patient Visual Report: No change from baseline Vision- Assessment Vision Assessment?: No apparent visual deficits  Cognition Overall Cognitive Status: Within Functional Limits for tasks assessed Arousal/Alertness: Awake/alert Orientation Level: Person;Place;Situation Person: Oriented Place: Oriented Situation: Oriented Year: 2017 Month: August Day of Week: Correct Memory: Appears intact Immediate Memory Recall: Sock;Blue;Bed Memory Recall: Sock;Blue;Bed Memory Recall Sock: Without Cue Memory Recall Blue: Without Cue Memory Recall Bed: Without Cue Problem Solving: Appears intact Safety/Judgment: Other (comment) Comments: mildly impulsive with sit<>stand Sensation Sensation Light Touch: Appears Intact Stereognosis: Not tested Hot/Cold: Not tested Proprioception: Appears Intact Coordination Gross Motor Movements are Fluid and Coordinated: No Fine Motor Movements are Fluid and Coordinated: Yes Motor  Motor Motor: Abnormal postural alignment and control Motor - Skilled Clinical Observations: decreased balance and muscle weakness Mobility  Bed Mobility Bed Mobility: Not assessed (pt in chair on arrival) Transfers Transfers: Sit to Stand;Stand to Sit Sit  to Stand: 4: Min guard Stand to Sit: 4: Min guard   Balance Balance Balance Assessed: Yes Static Sitting Balance Static Sitting - Balance Support: No upper extremity supported Static Sitting - Level of Assistance: 7: Independent Dynamic Sitting Balance Dynamic Sitting - Balance Support: No upper extremity supported;During functional activity Dynamic Sitting - Level of Assistance: 6: Modified independent (Device/Increase time) Static Standing Balance Static Standing - Balance Support: Bilateral upper extremity supported Static Standing - Level of Assistance: 5: Stand by assistance Dynamic Standing Balance Dynamic Standing - Balance Support: Left upper extremity supported Dynamic Standing - Level of Assistance: 3: Mod assist;4: Min assist Extremity/Trunk Assessment RUE Assessment RUE Assessment: Within Functional Limits LUE Assessment LUE Assessment: Within Functional Limits   See Function Navigator for Current Functional Status.   Refer to Care Plan for Long Term Goals  Recommendations for other services: None  Discharge Criteria: Patient will be discharged from OT if patient refuses treatment 3 consecutive times without medical reason, if treatment goals not met, if there is a change in medical status, if patient makes no progress towards goals or if patient is discharged from hospital.  The above assessment, treatment plan, treatment alternatives and goals were discussed and mutually agreed upon: by patient  Phineas Semen 08/04/2016, 1:00 PM

## 2016-08-04 NOTE — Progress Notes (Signed)
[facility] PHYSICAL MEDICINE & REHABILITATION     PROGRESS NOTE  Subjective/Complaints:  Pt seen working with OT this AM.  He had a good first night.    ROS: Denies CP, SOB, N/V/D.  Objective: Vital Signs: Blood pressure 133/72, pulse 68, temperature 98 F (36.7 C), temperature source Oral, resp. rate 18, height $RemoveBe'5\' 9"'NddvvvIcI$  (1.753 m), weight (!) 137.5 kg (303 lb 2.1 oz), SpO2 100 %. No results found.  Recent Labs  08/03/16 0242 08/04/16 0500  WBC 10.5 8.2  HGB 9.2* 8.9*  HCT 29.9* 28.9*  PLT 320 308    Recent Labs  08/03/16 0242 08/04/16 0500  NA 135 136  K 4.1 3.9  CL 104 103  GLUCOSE 264* 221*  BUN 20 18  CREATININE 1.93* 1.80*  CALCIUM 8.5* 8.5*   CBG (last 3)   Recent Labs  08/03/16 0658 08/03/16 1101 08/03/16 1623  GLUCAP 196* 233* 226*    Wt Readings from Last 3 Encounters:  08/03/16 (!) 137.5 kg (303 lb 2.1 oz)  08/03/16 135.3 kg (298 lb 4.5 oz)  02/16/16 134.7 kg (297 lb)    Physical Exam:  BP 133/72 (BP Location: Left Arm)   Pulse 68   Temp 98 F (36.7 C) (Oral)   Resp 18   Ht $R'5\' 9"'Wk$  (1.753 m)   Wt (!) 137.5 kg (303 lb 2.1 oz)   SpO2 100%   BMI 44.76 kg/m  Constitutional:  He appears well-developed. Obese. NAD. Vital signs reviewed.  HENT: Normocephalicand atraumatic.  Eyes: Conjunctivaeand EOMare normal.  Cardiovascular: Normal rateand regular rhythm. No murmurheard. Respiratory: Effort normaland breath sounds normal. No respiratory distress. He has no wheezes.  GI: Soft. Bowel sounds are normal. He exhibits no distension. There is no tenderness.  Musculoskeletal:  Right TMA He exhibits edema. He exhibits no tenderness.  1+ pitting edema right shin/foot.   Neurological: He is alertand oriented Alert and oriented.  Follows all commands.  UE 5/5. RLE: 5/5HF, ADF/PF 2/5 and limited by pain/dressing.   LLE: 5/5 prox to distal.  Skin: Skin is warmand dry.  Dressing with serosanginous drainage Psychiatric: He has a normal mood and  affect. His behavior is normal. Judgmentand thought contentnormal  Assessment/Plan: 1. Functional deficits secondary to right transmetatarsal amputation which require 3+ hours per day of interdisciplinary therapy in a comprehensive inpatient rehab setting. Physiatrist is providing close team supervision and 24 hour management of active medical problems listed below. Physiatrist and rehab team continue to assess barriers to discharge/monitor patient progress toward functional and medical goals.  Function:  Bathing Bathing position      Bathing parts      Bathing assist        Upper Body Dressing/Undressing Upper body dressing                    Upper body assist        Lower Body Dressing/Undressing Lower body dressing                                  Lower body assist        Toileting Toileting          Toileting assist     Transfers Chair/bed Clinical biochemist  Cognition Comprehension    Expression    Social Interaction    Problem Solving    Memory       Medical Problem List and Plan: 1.  Debility secondary to PAD right transmetatarsal amputation secondary to osteomyelitis 07/24/2016/multi-medical. Nonweightbearing right lower extremity  Begin CIR 2.  DVT Prophylaxis/Anticoagulation: Brilinta 3. Pain Management: Percocet and Robaxin as needed. 4. Acute blood loss anemia.   Hb 8.9 on 8/23  Cont to monitor 5. Neuropsych: This patient is capable of making decisions on his own behalf. 6. Skin/Wound Care: Routine skin checks 7. Fluids/Electrolytes/Nutrition: Routine I&O's  8. ID. Continue intravenous Teflaro 07/25/2016 3 weeks total (per ID) 9. CAD. Status post stenting 07/28/2016. Follow-up cardiology services 10. Chronic combined systolic and diastolic congestive heart failure. Monitor for any signs of fluid overload Filed Weights   08/03/16 1824  Weight: (!)  137.5 kg (303 lb 2.1 oz)   11. Diabetes mellitus with peripheral neuropathy. Hemoglobin A1c 9.5. NPH insulin 20 units a.m. 22 units daily at bedtime. Check blood sugars before meals and at bedtime with diabetic teaching  Will adjust with increased activity 12. Hypertension. Imdur 90 mg daily, Toprol-XL 75 mg daily, hydralazine 37.5 mg twice a day, Cardura 8 mg daily, Norvasc 10 mg daily. Monitor with increased mobility 13. CRI. Creatinine 1.60-1.78.   Cr. 1.80 on 8/23 14. Hyperlipidemia. Crestor 15. Morbid obesity  Body mass index is 44.76 kg/m.  Diet and exercise education  Will cont to encourage weight loss to increase endurance and promote overall health  LOS (Days) 1 A FACE TO FACE EVALUATION WAS PERFORMED  Joshua [name] 08/04/2016 9:57 AM

## 2016-08-05 ENCOUNTER — Inpatient Hospital Stay (HOSPITAL_COMMUNITY): Payer: BLUE CROSS/BLUE SHIELD | Admitting: Occupational Therapy

## 2016-08-05 ENCOUNTER — Inpatient Hospital Stay (HOSPITAL_COMMUNITY): Payer: Self-pay | Admitting: Physical Therapy

## 2016-08-05 LAB — GLUCOSE, CAPILLARY
Glucose-Capillary: 119 mg/dL — ABNORMAL HIGH (ref 65–99)
Glucose-Capillary: 180 mg/dL — ABNORMAL HIGH (ref 65–99)
Glucose-Capillary: 126 mg/dL — ABNORMAL HIGH (ref 65–99)
Glucose-Capillary: 122 mg/dL — ABNORMAL HIGH (ref 65–99)
Glucose-Capillary: 126 mg/dL — ABNORMAL HIGH (ref 65–99)

## 2016-08-05 NOTE — Progress Notes (Signed)
Physical Therapy Session Note  Patient Details  Name: Joshua [name] MRN: [id number] Date of Birth: Oct 18, 1956  Today's Date: 08/05/2016 PT Individual Time: 1005-1100 PT Individual Time Calculation (min): 55 min    Short Term Goals: Week 1:  PT Short Term Goal 1 (Week 1): = LTGs due to ELOS  Skilled Therapeutic Interventions/Progress Updates:    Pt received resting in bed, no c/o pain and agreeable to therapy session.  Session focus on balance, gait training, and transfers.  Pt performing sit<>stand transfers from bed, w/c, and 19" therapy mat with RW and steady assist fade to close supervision.  PT instructed pt in dynamic standing balance activity in // bars reaching for horseshoes with pt demonstrating heavy reliance on upper extremities initially.  Static standing balance in // bars focus on decreased use of UEs and pt able to maintain SLS with no UE support x5-10 seconds for 3 trials.  Static progress to dynamic standing with RW reaching for horseshoes and placing on basketball rim 3 trials of 10 reps each.  Gait training 2x50' with RW and steady assist fade to close supervision with min verbal cues for use of UEs to soften landing when hopping and mod verbal cues for approaching chair to sit safely.  Gait training 2x10' with RW focus on ambulatory approach to sit with pt demonstrating good carry over and supervision for transfers by the second trial.  PT switched out pt's w/c due to brakes not locking and pt returned to room, left upright in w/c with call bell in reach and needs met.   Therapy Documentation Precautions:  Precautions Precautions: Fall Precaution Comments: recent PCI for unstable angina, declining CABG Restrictions Weight Bearing Restrictions: Yes RUE Weight Bearing: Weight bearing as tolerated RLE Weight Bearing: Non weight bearing Other Position/Activity Restrictions: able to maintain during ambulation   See Function Navigator for Current Functional  Status.   Therapy/Group: Individual Therapy  Earnest Conroy Pen[place]Crew 08/05/2016, 12:01 PM

## 2016-08-05 NOTE — Patient Care Conference (Signed)
Inpatient RehabilitationTeam Conference and Plan of Care Update Date: 08/04/2016   Time: 2:50 PM    Patient Name: Joshua [name]      Medical Record Number: [id number]  Date of Birth: April 03, 1956 Sex: Male         Room/Bed: 4W17C/4W17C-01 Payor Info: Payor: [place] / Plan: BCBS OTHER / Product Type: *No Product type* /    Admitting Diagnosis: Transmet AMP  Admit Date/Time:  08/03/2016  6:02 PM Admission Comments: No comment available   Primary Diagnosis:  <principal problem not specified> Principal Problem: <principal problem not specified>  Patient Active Problem List   Diagnosis Date Noted  . Acute blood loss anemia   . DM type 2 with diabetic peripheral neuropathy ([place])   . Cellulitis and abscess   . S/P angioplasty with stent 07/29/16 DES to LAD ostium to prox vessel overlapping previously placed stents; DES 2nd OM 08/03/2016  . Status post transmetatarsal amputation of right foot ([place]) 08/03/2016  . Physical debility 08/03/2016  . S/P transmetatarsal amputation of foot ([place]) 08/03/2016  . Acute on chronic combined systolic and diastolic CHF, NYHA class 3 ([place])   . Chronic osteomyelitis of right foot ([place])   . Cellulitis and abscess of leg, except foot   . Penetrating wound of right foot   . Type 1 diabetes mellitus with right diabetic foot ulcer ([place])   . Chronic osteomyelitis of toe of right foot ([place])   . CKD (chronic kidney disease) stage 3, GFR 30-59 ml/min   . CKD (chronic kidney disease)   . Unstable angina pectoris ([place]) 07/18/2016  . Type II diabetes mellitus ([place])   . Chronic combined systolic and diastolic congestive heart failure ([place])   . Coronary artery disease   . Hyperlipemia   . Coronary artery disease involving native coronary artery of native heart without angina pectoris   . Angina pectoris ([place])   . Intermediate coronary syndrome ([place]) 07/03/2014  . Chest pain with moderate risk of acute coronary syndrome 06/24/2014  . CAD in native  artery 06/24/2014  . Sleep apnea- on C-pap 06/24/2014  . Morbid obesity due to excess calories ([place]) 11/10/2013  . Uncontrolled diabetes mellitus with peripheral autonomic neuropathy ([place]) 10/29/2013  . Poorly controlled diabetes mellitus ([place]) 10/29/2013  . Hyperglycemia 10/29/2013  . Presence of stent in anterior descending branch of left coronary artery 10/31/2011  . Hyperlipidemia 01/12/2011  . Benign essential HTN 01/12/2011  . GERD 01/12/2011    Expected Discharge Date: Expected Discharge Date: 08/13/16  Team Members Present: Physician leading conference: Dr. Delice Lesch Social Worker Present: Lennart Pall, LCSW Nurse Present: Dorthula Nettles, RN PT Present: Dwyane Dee, PT OT Present: Benay Pillow, OT PPS Coordinator present : Daiva Nakayama, RN, CRRN     Current Status/Progress Goal Weekly Team Focus  Medical   Debility secondary to PAD right transmetatarsal amputation secondary to osteomyelitis 07/24/2016/multi-medical. Nonweightbearing right lower extremity  Improve mobility, transfers, cardiac function, wound drainage, abcess  See above   Bowel/Bladder   continent of bowel and bladder  remain continent of bowel and bladder  monitor   Swallow/Nutrition/ Hydration             ADL's   min A overall with RW  mod I overall, supervision - shower transfer  pt education, self care retraining, balance, functional transfers, and safety   Mobility   min assist overall transfers and gait with RW  mod I overall  endurance, balance, gait with RW   Communication  Safety/Cognition/ Behavioral Observations            Pain   pain managed with prn perocet 1-2 tabs  pain at or below 3 with min assistance  Monitor pain q shift and prn   Skin   Right great toe amputation   remain free of infection/ breakdown with min assitance  Monitor skin q shift and prn     Rehab Goals Patient on target to meet rehab goals: Yes *See Care Plan and progress notes for long and  short-term goals.  Barriers to Discharge: CKD, CHF, ABLA, drainage from wound    Possible Resolutions to Barriers:  Follow wound, follow weights, monitor wound, cont IV abx per ID    Discharge Planning/Teaching Needs:  Home with wife and adult son who can provide intermittent assistance.      Team Discussion:  New eval.  Medically stable but still with some drainage through [place].  Did very well on initial evals and setting mod independent goals.  Expect home with IV abx.  Revisions to Treatment Plan:  None   Continued Need for Acute Rehabilitation Level of Care: The patient requires daily medical management by a physician with specialized training in physical medicine and rehabilitation for the following conditions: Daily direction of a multidisciplinary physical rehabilitation program to ensure safe treatment while eliciting the highest outcome that is of practical value to the patient.: Yes Daily medical management of patient stability for increased activity during participation in an intensive rehabilitation regime.: Yes Daily analysis of laboratory values and/or radiology reports with any subsequent need for medication adjustment of medical intervention for : Post surgical problems;Renal problems;Cardiac problems;Wound care problems  Phenix Vandermeulen 08/05/2016, 3:23 PM

## 2016-08-05 NOTE — Progress Notes (Signed)
Joshua [name] PHYSICAL MEDICINE & REHABILITATION     PROGRESS NOTE  Subjective/Complaints:  Pt finishing bathing with OT.  Joshua [name] slept fairly overnight due to overflow from his urinal.    ROS: Denies CP, SOB, N/V/D.  Objective: Vital Signs: Blood pressure (!) 130/59, pulse 78, temperature 98.4 F (36.9 C), temperature source Oral, resp. rate 18, height 5\' 9"  (1.753 m), weight (!) 136.6 kg (301 lb 2.4 oz), SpO2 96 %. No results found.  Recent Labs  08/03/16 0242 08/04/16 0500  WBC 10.5 8.2  HGB 9.2* 8.9*  HCT 29.9* 28.9*  PLT 320 308    Recent Labs  08/03/16 0242 08/04/16 0500  NA 135 136  K 4.1 3.9  CL 104 103  GLUCOSE 264* 221*  BUN 20 18  CREATININE 1.93* 1.80*  CALCIUM 8.5* 8.5*   CBG (last 3)   Recent Labs  08/04/16 0444 08/04/16 0638 08/04/16 1143  GLUCAP 205* 205* 127*    Wt Readings from Last 3 Encounters:  08/05/16 (!) 136.6 kg (301 lb 2.4 oz)  08/03/16 135.3 kg (298 lb 4.5 oz)  02/16/16 134.7 kg (297 lb)    Physical Exam:  BP (!) 130/59 (BP Location: Left Arm)   Pulse 78   Temp 98.4 F (36.9 C) (Oral)   Resp 18   Ht 5\' 9"  (1.753 m)   Wt (!) 136.6 kg (301 lb 2.4 oz)   SpO2 96%   BMI 44.47 kg/m  Constitutional:  Joshua [name] appears well-developed. Obese. NAD. Vital signs reviewed.  HENT: Normocephalicand atraumatic.  Eyes: Conjunctivaeand EOMare normal.  Cardiovascular: Normal rateand regular rhythm. No murmurheard. Respiratory: Effort normaland breath sounds normal. No respiratory distress. Joshua [name] has no wheezes.  GI: Soft. Bowel sounds are normal. Joshua [name] exhibits no distension. There is no tenderness.  Musculoskeletal:  Right TMA Joshua [name] exhibits no tenderness.  Edema right shin/foot.   Neurological: Joshua [name] is alertand oriented Alert and oriented.  Follows all commands.  UE 5/5. RLE: 5/5HF, ADF/PF 3/5.   LLE: 5/5 prox to distal.  Skin: Skin is warmand dry.  Small areas of opening between sutures with sanguinous drainage.  Psychiatric: Joshua [name] has a normal  mood and affect. His behavior is normal. Judgmentand thought contentnormal  Assessment/Plan: 1. Functional deficits secondary to right transmetatarsal amputation which require 3+ hours per day of interdisciplinary therapy in a comprehensive inpatient rehab setting. Physiatrist is providing close team supervision and 24 hour management of active medical problems listed below. Physiatrist and rehab team continue to assess barriers to discharge/monitor patient progress toward functional and medical goals.  Function:  Bathing Bathing position   Position: Shower  Bathing parts Body parts bathed by patient: Right arm, Left arm, Chest, Abdomen, Front perineal area, Buttocks, Right upper leg, Left upper leg, Left lower leg Body parts bathed by helper: Back  Bathing assist Assist Level: Supervision or verbal cues      Upper Body Dressing/Undressing Upper body dressing   What is the patient wearing?: Pull over shirt/dress     Pull over shirt/dress - Perfomed by patient: Put head through opening, Thread/unthread left sleeve, Thread/unthread right sleeve, Pull shirt over trunk          Upper body assist Assist Level: Set up      Lower Body Dressing/Undressing Lower body dressing   What is the patient wearing?: Pants, Non-skid slipper socks     Pants- Performed by patient: Thread/unthread right pants leg, Thread/unthread left pants leg, Pull pants up/down, Fasten/unfasten pants   Non-skid slipper socks- Performed  by patient: Don/doff left sock                    Lower body assist Assist for lower body dressing: Supervision or verbal cues      Toileting Toileting   Toileting steps completed by patient: Adjust clothing prior to toileting, Performs perineal hygiene, Adjust clothing after toileting      Toileting assist Assist level: Touching or steadying assistance (Pt.75%)   Transfers Chair/bed transfer   Chair/bed transfer method: Squat pivot Chair/bed transfer assist  level: Touching or steadying assistance (Pt > 75%) Chair/bed transfer assistive device: Armrests, Bedrails     Locomotion Ambulation     Max distance: 45 ft Assist level: Touching or steadying assistance (Pt > 75%)   Wheelchair   Type: Manual Max wheelchair distance: 125 ft Assist Level: Supervision or verbal cues  Cognition Comprehension Comprehension assist level: Follows complex conversation/direction with no assist  Expression Expression assist level: Expresses complex ideas: With no assist  Social Interaction Social Interaction assist level: Interacts appropriately with others - No medications needed.  Problem Solving Problem solving assist level: Solves complex problems: Recognizes & self-corrects  Memory Memory assist level: Complete Independence: No helper     Medical Problem List and Plan: 1.  Debility secondary to PAD right transmetatarsal amputation secondary to osteomyelitis 07/24/2016/multi-medical. Nonweightbearing right lower extremity  Cont CIR 2.  DVT Prophylaxis/Anticoagulation: Brilinta 3. Pain Management: Percocet and Robaxin as needed. 4. Acute blood loss anemia.   Hb 8.9 on 8/23  Cont to monitor 5. Neuropsych: This patient is capable of making decisions on his own behalf. 6. Skin/Wound Care:   Small areas of dehiscence in TMA   Cont dressing changes 7. Fluids/Electrolytes/Nutrition: Routine I&O's  8. ID. Continue intravenous Teflaro 07/25/2016 3 weeks total (per ID) 9. CAD. Status post stenting 07/28/2016. Follow-up cardiology services 10. Chronic combined systolic and diastolic congestive heart failure. Monitor for any signs of fluid overload Filed Weights   08/03/16 1824 08/05/16 0622  Weight: (!) 137.5 kg (303 lb 2.1 oz) (!) 136.6 kg (301 lb 2.4 oz)   11. Diabetes mellitus with peripheral neuropathy. Hemoglobin A1c 9.5. NPH insulin 20 units a.m. 22 units daily at bedtime. Check blood sugars before meals and at bedtime with diabetic  teaching  Will adjust with increased activity  ?Improving, will cont to monitor 12. Hypertension. Imdur 90 mg daily, Toprol-XL 75 mg daily, hydralazine 37.5 mg twice a day, Cardura 8 mg daily, Norvasc 10 mg daily.   Monitor with increased mobility  Fairly well controlled at present 13. CRI. Creatinine 1.60-1.78.   Cr. 1.80 on 8/23 14. Hyperlipidemia. Crestor 15. Morbid obesity  Body mass index is 44.47 kg/m.  Diet and exercise education  Will cont to encourage weight loss to increase endurance and promote overall health  LOS (Days) 2 A FACE TO FACE EVALUATION WAS PERFORMED  Sanda Dejoy Lorie Phenix 08/05/2016 8:52 AM

## 2016-08-05 NOTE — Progress Notes (Signed)
Occupational Therapy Session Note  Patient Details  Name: Joshua [name] MRN: [id number] Date of Birth: Nov 20, 1956  Today's Date: 08/05/2016 OT Individual Time: 2263-3354 and 1300-1413 OT Individual Time Calculation (min): 55 min and 73 min     Short Term Goals: Week 1:  OT Short Term Goal 1 (Week 1): STGs=LTGs secondary to short estimated LOS  Skilled Therapeutic Interventions/Progress Updates:    Session 1:  Upon entering the room, pt supine in bed with no c/o pain this session. Pt requesting to shower this session. Supine >sit with supervision to EOB. Pt performed sit >stand from bed with steady assistance. Pt hops into bathroom with RW and steady assist with min cues for managing bathroom threshold safely. Pt seated on TTB and performed bathing with steady assist during lateral leans to wash buttocks and peri area. Pt exiting and donning clothing items while seated on EOB. Pt performed LB clothing management by crossing ankle to opposite knee to thread onto foot with supervision and increased time to get over B hips. Pt remained seated on EOB with tray positioned in front of him. RN called to change dressing on R foot as MD removed to assess. Call bell and all needed items within reach upon exiting the room.   Session 2: Upon entering the room, pt seated in wheelchair awaiting therapist with no c/o pain this session. Pt propelled wheelchair 120' to ADL apartment with supervision and increased time. OT demonstrated and education transfer onto tub bench with use of RW. Pt returned demonstration by ambulating into bathroom with RW with steady assist and performing transfer with close supervision for safety. OT recommended purchase of safety treads in order to increase safety at home. Pt also engaged in transfer on and off of lover seat sofa. Pt requiring steady assist from low sofa surface with min cues for technique. OT demonstrated techniques to be utilized in kitchen in order to obtain items  from refrigerator, cabinets, and making snack as well as set up of kitchen from wheelchair level. Pt propelled wheelchair over steady sloped surface with increased time and OT assisted pt outside as he rested from fatigue. Pt returned to room at end of session with call bell and all needed items within reach upon exiting the room.   Therapy Documentation Precautions:  Precautions Precautions: Fall Precaution Comments: recent PCI for unstable angina, declining CABG Restrictions Weight Bearing Restrictions: Yes RUE Weight Bearing: Weight bearing as tolerated RLE Weight Bearing: Non weight bearing Other Position/Activity Restrictions: able to maintain during ambulation General:   Vital Signs: Therapy Vitals Temp: 98.4 F (36.9 C) Temp Source: Oral Pulse Rate: 78 Resp: 18 BP: (!) 130/59 Patient Position (if appropriate): Lying Oxygen Therapy SpO2: 96 % O2 Device: Not Delivered Pain: Pain Assessment Pain Assessment: No/denies pain Pain Score: 0-No pain  See Function Navigator for Current Functional Status.   Therapy/Group: Individual Therapy  Phineas Semen 08/05/2016, 8:02 AM

## 2016-08-06 ENCOUNTER — Inpatient Hospital Stay (HOSPITAL_COMMUNITY): Payer: Self-pay | Admitting: Occupational Therapy

## 2016-08-06 ENCOUNTER — Inpatient Hospital Stay (HOSPITAL_COMMUNITY): Payer: Self-pay | Admitting: Physical Therapy

## 2016-08-06 LAB — GLUCOSE, CAPILLARY
Glucose-Capillary: 153 mg/dL — ABNORMAL HIGH (ref 65–99)
Glucose-Capillary: 183 mg/dL — ABNORMAL HIGH (ref 65–99)
Glucose-Capillary: 85 mg/dL (ref 65–99)

## 2016-08-06 NOTE — Care Management Note (Signed)
Inpatient [facility] Individual Statement of Services  Patient Name:  Joshua [name]  Date:  08/06/2016  Welcome to the [facility].  Our goal is to provide you with an individualized program based on your diagnosis and situation, designed to meet your specific needs.  With this comprehensive rehabilitation program, you will be expected to participate in at least 3 hours of rehabilitation therapies Monday-Friday, with modified therapy programming on the weekends.  Your rehabilitation program will include the following services:  Physical Therapy (PT), Occupational Therapy (OT), 24 hour per day rehabilitation nursing, Therapeutic Recreaction (TR), Case Management (Social Worker), Rehabilitation Medicine, Nutrition Services and Pharmacy Services  Weekly team conferences will be held on Wednesdays to discuss your progress.  Your Social Worker will talk with you frequently to get your input and to update you on team discussions.  Team conferences with you and your family in attendance may also be held.  Expected length of stay: 7-10 days  Overall anticipated outcome: modified independent  Depending on your progress and recovery, your program may change. Your Social Worker will coordinate services and will keep you informed of any changes. Your Social Worker's name and contact numbers are listed  below.  The following services may also be recommended but are not provided by the [facility] will be made to provide these services after discharge if needed.  Arrangements include referral to agencies that provide these services.  Your insurance has been verified to be:  [place] Your primary doctor is:  [name]  Pertinent information will be shared with your doctor and your insurance company.  Social  Worker:  [place], [place] or ([phone]   Information discussed with and copy given to patient by: Lennart Pall, 08/06/2016, 3:16 PM

## 2016-08-06 NOTE — Progress Notes (Signed)
Pt is on NIV at this time no distress noted.

## 2016-08-06 NOTE — Progress Notes (Addendum)
Occupational Therapy Session Note  Patient Details  Name: Joshua [name] MRN: [id number] Date of Birth: July 17, 1956  Today's Date: 08/06/2016 OT Individual Time: [phone] and 0518-3358  OT Individual Time Calculation (min): 59 min and 60 minutes    Short Term Goals: Week 1:  OT Short Term Goal 1 (Week 1): STGs=LTGs secondary to short estimated LOS  Skilled Therapeutic Interventions/Progress Updates:   Pt participated in tx today focusing on self care mgt retraining while adhering to NWB precautions for R LE. Pt was lying supine in bed upon skilled OT arrival and was agreeable to have shower this morning. Supine<sit with supervision. Pt leans heavily to L side during sit<stand and was provided education on alternative method for power up with verbalized understanding. Pt ambulated to closet for clothing retrieval with RW and Min A on L side due to strong lean at times, especially when changing direction. Pt attempted to lift bag in closet and place on walker with cues for safety.  Shower transfer completed with Min A. R LE and access port covered. Bathing was completed with supervision while pt leaned laterally for pericare. Dressing was completed at EOB with pt leaning laterally for pulling pants and underwear over hips. Oral care completed w/c level at sink due to pt reported apprehension with standing during task. Pt completed task of putting away clothing items in drawers w/c level to improve activity tolerance. Afterwards pt was left in w/c with all needs within reach. No c/o pain today.   2nd Session 1:1 Tx (60 minutes)  Pt participated in skilled OT session focusing on standing tolerance, balance, safety awareness, and activity tolerance. Pt was agreeable to complete tx in therapy kitchen today. Pt self propelled to therapy apartment with extra time due to fatigue. Pt was instructed on proper walker placement and home kitchen modifications to maximize safety during meal prep at time of  discharge. Pt demonstrated carryover of education during simple meal prep task while hopping in kitchen with RW. Pt completed with Min A with instruction to plant L LE in center of walker to change center of gravity and prevent L leaning when completing sit<stand. During static standing portions of activity, pt rested R LE on top of L foot. Therapist cued pt to rest R foot on top of therapists foot to assess weight bearing status. Pt was WB through NWB LE. Education provided on adaptive technique for increasing NWB adherence including forward leaning with bilateral elbow support on countertop. Pt reports being "tempted" to use R LE when standing at all times.  Afterwards, pt reported having narrow bathroom to navigate into at home. Therapy bathroom modified to simulate home setup. Pt navigated in bathroom to complete simulated shower transfer onto tub bench with Min A and cues for safe hand placement. Pt required max vcs for w/c safety today during sit<stand. At end of session, pt propelled back to room. Pt left up in w/c with all needs within reach.   Therapy Documentation Precautions:  Precautions Precautions: Fall Precaution Comments: recent PCI for unstable angina, declining CABG Restrictions Weight Bearing Restrictions: Yes RUE Weight Bearing: Weight bearing as tolerated RLE Weight Bearing: Non weight bearing Other Position/Activity Restrictions: able to maintain during ambulation General:   Vital Signs: Therapy Vitals Pulse Rate: 77 BP: 114/68 Patient Position (if appropriate): Sitting Pain: Pain Assessment Pain Score: 2  ADL:   Exercises:   Other Treatments:    See Function Navigator for Current Functional Status.   Therapy/Group: Individual Therapy  Areen Trautner A  Tonette Koehne 08/06/2016, 12:33 PM

## 2016-08-06 NOTE — Progress Notes (Signed)
[place] PHYSICAL MEDICINE & REHABILITATION     PROGRESS NOTE  Subjective/Complaints:  Pt seen sitting at the edge of the bed.  He notes his ace wraps came off overnight.    ROS: Denies CP, SOB, N/V/D.  Objective: Vital Signs: Blood pressure 114/68, pulse 77, temperature 97.9 F (36.6 C), temperature source Axillary, resp. rate 17, height $RemoveBe'5\' 9"'XxkckIffG$  (1.753 m), weight 133.2 kg (293 lb 10.4 oz), SpO2 97 %. No results found.  Recent Labs  08/04/16 0500  WBC 8.2  HGB 8.9*  HCT 28.9*  PLT 308    Recent Labs  08/04/16 0500  NA 136  K 3.9  CL 103  GLUCOSE 221*  BUN 18  CREATININE 1.80*  CALCIUM 8.5*   CBG (last 3)   Recent Labs  08/05/16 0619 08/05/16 1125 08/05/16 1620  GLUCAP 126* 122* 126*    Wt Readings from Last 3 Encounters:  08/06/16 133.2 kg (293 lb 10.4 oz)  08/03/16 135.3 kg (298 lb 4.5 oz)  02/16/16 134.7 kg (297 lb)    Physical Exam:  BP 114/68 (BP Location: Left Arm)   Pulse 77   Temp 97.9 F (36.6 C) (Axillary)   Resp 17   Ht $R'5\' 9"'XS$  (1.753 m)   Wt 133.2 kg (293 lb 10.4 oz)   SpO2 97%   BMI 43.36 kg/m  Constitutional:  He appears well-developed. Obese. NAD. Vital signs reviewed.  HENT: Normocephalicand atraumatic.  Eyes: Conjunctivaeand EOMare normal.  Cardiovascular: Normal rateand regular rhythm. No murmurheard. Respiratory: Effort normaland breath sounds normal. No respiratory distress. He has no wheezes.  GI: Soft. Bowel sounds are normal. He exhibits no distension. There is no tenderness.  Musculoskeletal:  Right TMA He exhibits no tenderness.  Edema right shin/foot.   Neurological: He is alertand oriented Alert and oriented.  Follows all commands.  UE 5/5. RLE: 5/5HF, ADF/PF 3/5.   LLE: 5/5 prox to distal.  Skin: Skin is warmand dry.  Small areas of opening between sutures with sanguinous drainage (?slight improvement).  Psychiatric: He has a normal mood and affect. His behavior is normal. Judgmentand thought  contentnormal  Assessment/Plan: 1. Functional deficits secondary to right transmetatarsal amputation which require 3+ hours per day of interdisciplinary therapy in a comprehensive inpatient rehab setting. Physiatrist is providing close team supervision and 24 hour management of active medical problems listed below. Physiatrist and rehab team continue to assess barriers to discharge/monitor patient progress toward functional and medical goals.  Function:  Bathing Bathing position   Position: Shower  Bathing parts Body parts bathed by patient: Right arm, Left arm, Chest, Abdomen, Front perineal area, Buttocks, Right upper leg, Left upper leg, Left lower leg Body parts bathed by helper: Back  Bathing assist Assist Level: Supervision or verbal cues      Upper Body Dressing/Undressing Upper body dressing   What is the patient wearing?: Pull over shirt/dress     Pull over shirt/dress - Perfomed by patient: Put head through opening, Thread/unthread left sleeve, Thread/unthread right sleeve, Pull shirt over trunk          Upper body assist Assist Level: Set up      Lower Body Dressing/Undressing Lower body dressing   What is the patient wearing?: Pants, Non-skid slipper socks     Pants- Performed by patient: Thread/unthread right pants leg, Thread/unthread left pants leg, Pull pants up/down, Fasten/unfasten pants   Non-skid slipper socks- Performed by patient: Don/doff left sock  Lower body assist Assist for lower body dressing: Supervision or verbal cues      Toileting Toileting   Toileting steps completed by patient: Adjust clothing prior to toileting, Performs perineal hygiene, Adjust clothing after toileting      Toileting assist Assist level: Touching or steadying assistance (Pt.75%)   Transfers Chair/bed transfer   Chair/bed transfer method: Ambulatory Chair/bed transfer assist level: Touching or steadying assistance (Pt > 75%) (fade to  close supervision) Chair/bed transfer assistive device: Walker, Air cabin crew     Max distance: 50 Assist level: Supervision or verbal cues   Wheelchair   Type: Manual Max wheelchair distance: 150 Assist Level: No help, No cues, assistive device, takes more than reasonable amount of time  Cognition Comprehension Comprehension assist level: Follows complex conversation/direction with no assist  Expression Expression assist level: Expresses complex ideas: With no assist  Social Interaction Social Interaction assist level: Interacts appropriately with others - No medications needed.  Problem Solving Problem solving assist level: Solves complex problems: Recognizes & self-corrects  Memory Memory assist level: Complete Independence: No helper     Medical Problem List and Plan: 1.  Debility secondary to PAD right transmetatarsal amputation secondary to osteomyelitis 07/24/2016/multi-medical. Nonweightbearing right lower extremity  Cont CIR 2.  DVT Prophylaxis/Anticoagulation: Brilinta 3. Pain Management: Percocet and Robaxin as needed. 4. Acute blood loss anemia.   Hb 8.9 on 8/23  Cont to monitor 5. Neuropsych: This patient is capable of making decisions on his own behalf. 6. Skin/Wound Care:   Small areas of dehiscence in TMA   Cont dressing changes 7. Fluids/Electrolytes/Nutrition: Routine I&O's  8. ID. Continue intravenous Teflaro 07/25/2016 3 weeks total (per ID) 9. CAD. Status post stenting 07/28/2016. Follow-up cardiology services 10. Chronic combined systolic and diastolic congestive heart failure. Monitor for any signs of fluid overload Filed Weights   08/03/16 1824 08/05/16 0622 08/06/16 0500  Weight: (!) 137.5 kg (303 lb 2.1 oz) (!) 136.6 kg (301 lb 2.4 oz) 133.2 kg (293 lb 10.4 oz)   11. Diabetes mellitus with peripheral neuropathy. Hemoglobin A1c 9.5. NPH insulin 20 units a.m. 22 units daily at bedtime. Check blood sugars before meals and at  bedtime with diabetic teaching  Will adjust with increased activity  Improving, will cont to monitor 12. Hypertension. Imdur 90 mg daily, Toprol-XL 75 mg daily, hydralazine 37.5 mg twice a day, Cardura 8 mg daily, Norvasc 10 mg daily.   Monitor with increased mobility  Fairly well controlled at present 13. CRI. Creatinine 1.60-1.78.   Cr. 1.80 on 8/23 14. Hyperlipidemia. Crestor 15. Morbid obesity  Body mass index is 43.36 kg/m.  Diet and exercise education  Will cont to encourage weight loss to increase endurance and promote overall health  LOS (Days) 3 A FACE TO FACE EVALUATION WAS PERFORMED  Aailyah Dunbar Lorie Phenix 08/06/2016 10:10 AM

## 2016-08-06 NOTE — Progress Notes (Signed)
Physical Therapy Session Note  Patient Details  Name: Joshua [name] MRN: [id number] Date of Birth: [birth year]  Today's Date: 08/06/2016 PT Individual Time: 1115-1200 and 7583-0746 PT Individual Time Calculation (min): 45 min and 26 min    Short Term Goals: Week 1:  PT Short Term Goal 1 (Week 1): = LTGs due to ELOS  Skilled Therapeutic Interventions/Progress Updates:    Pt received resting in w/c, no c/o pain, and agreeable to therapy session.  Session focus on gait with RW, balance, transfers, and LE strengthening.  Pt propelled w/c to and from therapy gym mod I.  Gait training x90' with RW and close supervision with min verbal cues for walker positioning, step length, and safe ambulatory approach to sit in w/c.  Repeated sit<>stand x10 focus on smoothness of transition and controlled descent.  PT instructed pt in RLE therex with 4# ankle weight LAQ with 3 second hold, hip abduction in standing, hamstring curls in standing, and LLE single limb squats all x10 reps.  PT provided handout for pt to take HEP home.  Pt returned to room at end of session and left upright in w/c with call bell in reach and needs met.   Session 2: Pt received resting in w/c with no c/o pain and agreeable to therapy session.  Pt propelled w/c to and from therapy gym mod I.  PT instructed pt in forward negotiation of 4" curb step with RW to simulate home entry.  Pt ascended/descended step 3 trials with min assist overall, occasional mod for balance when lifting RW up/down from step. Pt with 1 major LOB on second trial ascending step requiring +2 to regain balance, but able to continue with curb negotiation with same level assist as before.  Pt returned to room at end of session and positioned upright in w/c with call bell in reach and needs met.   Therapy Documentation Precautions:  Precautions Precautions: Fall Precaution Comments: recent PCI for unstable angina, declining CABG Restrictions Weight Bearing  Restrictions: Yes RUE Weight Bearing: Weight bearing as tolerated RLE Weight Bearing: Non weight bearing Other Position/Activity Restrictions: able to maintain during ambulation   See Function Navigator for Current Functional Status.   Therapy/Group: Individual Therapy  Earnest Conroy Penven-Crew 08/06/2016, 12:49 PM

## 2016-08-06 NOTE — Progress Notes (Signed)
Social Work  Social Work Assessment and Plan  Patient Details  Name: Joshua [name] MRN: [id number] Date of Birth: 08-26-1956  Today's Date: 08/06/2016  Problem List:  Patient Active Problem List   Diagnosis Date Noted  . Acute blood loss anemia   . DM type 2 with diabetic peripheral neuropathy (HCC)   . Cellulitis and abscess   . S/P angioplasty with stent 07/29/16 DES to LAD ostium to prox vessel overlapping previously placed stents; DES 2nd OM 08/03/2016  . Status post transmetatarsal amputation of right foot (HCC) 08/03/2016  . Physical debility 08/03/2016  . S/P transmetatarsal amputation of foot (HCC) 08/03/2016  . Acute on chronic combined systolic and diastolic CHF, NYHA class 3 (HCC)   . Chronic osteomyelitis of right foot (HCC)   . Cellulitis and abscess of leg, except foot   . Penetrating wound of right foot   . Type 1 diabetes mellitus with right diabetic foot ulcer (HCC)   . Chronic osteomyelitis of toe of right foot (HCC)   . CKD (chronic kidney disease) stage 3, GFR 30-59 ml/min   . CKD (chronic kidney disease)   . Unstable angina pectoris (HCC) 07/18/2016  . Type II diabetes mellitus (HCC)   . Chronic combined systolic and diastolic congestive heart failure (HCC)   . Coronary artery disease   . Hyperlipemia   . Coronary artery disease involving native coronary artery of native heart without angina pectoris   . Angina pectoris (HCC)   . Intermediate coronary syndrome (HCC) 07/03/2014  . Chest pain with moderate risk of acute coronary syndrome 06/24/2014  . CAD in native artery 06/24/2014  . Sleep apnea- on C-pap 06/24/2014  . Morbid obesity due to excess calories (HCC) 11/10/2013  . Uncontrolled diabetes mellitus with peripheral autonomic neuropathy (HCC) 10/29/2013  . Poorly controlled diabetes mellitus (HCC) 10/29/2013  . Hyperglycemia 10/29/2013  . Presence of stent in anterior descending branch of left coronary artery 10/31/2011  . Hyperlipidemia  01/12/2011  . Benign essential HTN 01/12/2011  . GERD 01/12/2011   Past Medical History:  Past Medical History:  Diagnosis Date  . Chronic combined systolic and diastolic CHF (congestive heart failure) (HCC)    a. 07/2015 Echo: EF 45-50%, mod LVH, mid apical/antsept AK, Gr 1 DD.  [name] Coronary artery disease    a. 2012 s/p MI/PCI: s/p DES to mid/dist RCA and overlapping DES to LAD;  b. 07/2015 Cath: LAD 10% ISR, D1 40(jailed), LCX 67m, OM2 30, OM3 30, RCA 29m ISR, 10d ISR.  . Diabetic gastroparesis (HCC)   . Essential hypertension   . GERD (gastroesophageal reflux disease)   . Gout   . Heart murmur   . Hyperlipemia   . Morbid obesity (HCC)   . Myocardial infarction (HCC) 2012  . OSA on CPAP   . Polyneuropathy in diabetes(357.2)   . Pulmonary sarcoidosis (HCC)    "problems w/it years ago; no problems anymore" (07/03/2014)  . Status post transmetatarsal amputation of right foot (HCC) 08/03/2016  . Type II diabetes mellitus (HCC)    Past Surgical History:  Past Surgical History:  Procedure Laterality Date  . AMPUTATION Right 07/24/2016   Procedure: TRANSMETATARSAL FOOT AMPUTATION;  Surgeon: Nadara Mustard, MD;  Location: [facility] OR;  Service: Orthopedics;  Laterality: Right;  . BREAST LUMPECTOMY Right ~ 2000   "benign"  . CARDIAC CATHETERIZATION N/A 07/30/2015   Procedure: Left Heart Cath and Coronary Angiography;  Surgeon: Kathleene Hazel, MD;  Location: [facility] INVASIVE CV LAB;  Service:  Cardiovascular;  Laterality: N/A;  . CARDIAC CATHETERIZATION N/A 07/19/2016   Procedure: Left Heart Cath and Coronary Angiography;  Surgeon: Belva Crome, MD;  Location: [facility] CV LAB;  Service: Cardiovascular;  Laterality: N/A;  . CARDIAC CATHETERIZATION N/A 07/29/2016   Procedure: Coronary Stent Intervention;  Surgeon: Belva Crome, MD;  Location: [place] CV LAB;  Service: Cardiovascular;  Laterality: N/A;  . CARDIAC CATHETERIZATION N/A 07/29/2016   Procedure: Intravascular Ultrasound/IVUS;   Surgeon: Belva Crome, MD;  Location: [place] CV LAB;  Service: Cardiovascular;  Laterality: N/A;  . CATARACT EXTRACTION W/ INTRAOCULAR LENS  IMPLANT, BILATERAL Bilateral 2014-2015  . CORONARY ANGIOPLASTY WITH STENT PLACEMENT  10/31/2011   30 % MID ATRIOVENTRICULAR GROOVE CX STENOSIS. 90 % MID RCA.PTA TO LAD/STENTING OF TANDEM 60-70%. LAD STENOSIS 99% REDUCED TO 0%.3.0 X 32MM PROMUS DES POSTDILATED TO 3.25MM.  [name] CORONARY ANGIOPLASTY WITH STENT PLACEMENT  07/03/2014   "2"  . I&D EXTREMITY Right 10/30/2013   Procedure: IRRIGATION AND DEBRIDEMENT RIGHT SMALL FINGER POSSIBLE SECONDARY WOUND CLOSURE;  Surgeon: [name], MD;  Location: [facility] ORS;  Service: Orthopedics;  Laterality: Right;  [name] is available after 4pm  . I&D EXTREMITY Right 11/01/2013   Procedure:  IRRIGATION AND DEBRIDEMENT RIGHT  PROXIMAL PHALANGEAL LEVEL AMPUTATION;  Surgeon: [name], MD;  Location: [facility] ORS;  Service: Orthopedics;  Laterality: Right;  . INCISION AND DRAINAGE Right 10/28/2013   Procedure: INCISION AND DRAINAGE Right small finger;  Surgeon: [name], MD;  Location: [facility] ORS;  Service: Orthopedics;  Laterality: Right;  . LEFT HEART CATH Left 11/03/2011   Procedure: LEFT HEART CATH;  Surgeon: [name], MD;  Location: [facility] CATH LAB;  Service: Cardiovascular;  Laterality: Left;  . LEFT HEART CATHETERIZATION WITH CORONARY ANGIOGRAM N/A 07/03/2014   Procedure: LEFT HEART CATHETERIZATION WITH CORONARY ANGIOGRAM;  Surgeon: [name], MD;  Location: [facility] CATH LAB;  Service: Cardiovascular;  Laterality: N/A;  . PERCUTANEOUS CORONARY STENT INTERVENTION (PCI-S) Left 11/03/2011   Procedure: PERCUTANEOUS CORONARY STENT INTERVENTION (PCI-S);  Surgeon: [name], MD;  Location: [facility] CATH LAB;  Service: Cardiovascular;  Laterality: Left;  . TOE AMPUTATION Right ~ 2010   "2nd toe"  . TOE AMPUTATION Right 2012   "3rd toe"   Social History:  reports that he has never smoked. He has never  used smokeless tobacco. He reports that he drinks alcohol. He reports that he does not use drugs.  Family / Support Systems Marital Status: Married Patient Roles: Spouse, Parent Spouse/Significant Other: wife, Jurgen Groeneveld @ [phone] Children: Pt has 3 sons living at home ages 19,21 and 74 yrs.  Son, Gerald Stabs, is eldest @ (C) [phone] Anticipated Caregiver: wife and sons Ability/Limitations of Caregiver: wife works days. Sons avaialble when wife works Careers adviser: 24/7 Family Dynamics: Pt describes all family members as very supportive and willing to assist  Social History Preferred language: English Religion: [employer] Cultural Background: NA Read: Yes Write: Yes Employment Status: Unemployed Date Retired/Disabled/Unemployed: Laid off from job in April 2017 Legal Hisotry/Current Legal Issues: None Guardian/Conservator: None - per MD, pt is capable of making decisions on his own behalf   Abuse/Neglect Physical Abuse: Denies Verbal Abuse: Denies Sexual Abuse: Denies Exploitation of patient/patient's resources: Denies Self-Neglect: Denies  Emotional Status Pt's affect, behavior adn adjustment status: Pt very flat and not easily engaged.  He expresses frustration over his situation as it has placed a further financial burden on his household.  He denies  any emotional distress about the actual amputation, "No. I'm fine with this but now I can't start my job."  (he was to begin a new job just as his health declined).  will monitor pt while here and refer to neuropsych as inidicated. Recent Psychosocial Issues: Loss of job in April. Pyschiatric History: None Substance Abuse History: None  Patient / Family Perceptions, Expectations & Goals Pt/Family understanding of illness & functional limitations: Pt and family with good understanding of health issues which resulted in need for amputation.  Good understanding of current functional limitations/ need for CIR. Premorbid  pt/family roles/activities: Pt was independent Anticipated changes in roles/activities/participation: Little change as team has set mod ind goals. Pt/family expectations/goals: "I just need to figure out how to do things a different way."  [employer]: None Transportation available at discharge: yes Resource referrals recommended: Support group (specify)  Discharge Planning Living Arrangements: Spouse/significant other, Children Support Systems: Spouse/significant other, Children Type of Residence: Private residence Administrator, sports: Multimedia programmer (specify) Nurse, mental health) Financial Screen Referred: No Living Expenses: Higher education careers adviser Management: Patient Does the patient have any problems obtaining your medications?: No Home Management: pt and family Patient/Family Preliminary Plans: Pt to return home with wife and son who can coordinate 24/7 assistance (but goals being set for mod ind) Social Work Anticipated Follow Up Needs: HH/OP Expected length of stay: ELOS 7 days  Clinical Impression Unfortunate gentleman here following transmet amputation.  Difficult to engage in conversation and admits he is frustrated with overall situation because of the further financial burden it puts him and his family in.  He denies any emotional distress about the amputation itself.  Good family support and anticipating mod I goals.  Will follow for support and d/c planning needs.  Linh Johannes 08/06/2016, 4:12 PM

## 2016-08-06 NOTE — Progress Notes (Signed)
Social Work Patient ID: Joshua [name], male   DOB: 1956/11/13, 60 y.o.   MRN: [id number]  Have reviewed team conference info with pt who is aware and agreeable with targeted d/c date of 9/1 and mod independent goals.  No concerns at this time.  Chestina Komatsu, LCSW

## 2016-08-07 ENCOUNTER — Inpatient Hospital Stay (HOSPITAL_COMMUNITY): Payer: Self-pay | Admitting: Occupational Therapy

## 2016-08-07 LAB — GLUCOSE, CAPILLARY
Glucose-Capillary: 96 mg/dL (ref 65–99)
Glucose-Capillary: 184 mg/dL — ABNORMAL HIGH (ref 65–99)

## 2016-08-07 NOTE — Progress Notes (Signed)
[place] PHYSICAL MEDICINE & REHABILITATION     PROGRESS NOTE  Subjective/Complaints:  Pt laying in bed this AM.  He slept well overnight.  He does not have any complaints.   ROS: Denies CP, SOB, N/V/D.  Objective: Vital Signs: Blood pressure 134/68, pulse 71, temperature 98.8 F (37.1 C), temperature source Oral, resp. rate 16, height $RemoveBe'5\' 9"'NewRkojpW$  (1.753 m), weight 135.9 kg (299 lb 9.7 oz), SpO2 99 %. No results found. No results for input(s): WBC, HGB, HCT, PLT in the last 72 hours. No results for input(s): NA, K, CL, GLUCOSE, BUN, CREATININE, CALCIUM in the last 72 hours.  Invalid input(s): CO CBG (last 3)   Recent Labs  08/05/16 2052 08/06/16 0648 08/06/16 1206  GLUCAP 153* 183* 85    Wt Readings from Last 3 Encounters:  08/07/16 135.9 kg (299 lb 9.7 oz)  08/03/16 135.3 kg (298 lb 4.5 oz)  02/16/16 134.7 kg (297 lb)    Physical Exam:  BP 134/68   Pulse 71   Temp 98.8 F (37.1 C) (Oral)   Resp 16   Ht $R'5\' 9"'bQ$  (1.753 m)   Wt 135.9 kg (299 lb 9.7 oz)   SpO2 99%   BMI 44.24 kg/m  Constitutional:  He appears well-developed. Obese. NAD. Vital signs reviewed.  HENT: Normocephalicand atraumatic.  Eyes: Conjunctivaeand EOMare normal.  Cardiovascular: Normal rateand regular rhythm. No murmurheard. Respiratory: Effort normaland breath sounds normal. No respiratory distress. He has no wheezes.  GI: Soft. Bowel sounds are normal. He exhibits no distension. There is no tenderness.  Musculoskeletal:  Right TMA He exhibits no tenderness.  Edema right foot.   Neurological: He is alertand oriented Alert and oriented.  Follows all commands.  UE 5/5. RLE: 5/5HF, ADF/PF 3/5.   LLE: 5/5 prox to distal.  Skin: Skin is warmand dry.  Incision with dressing c/d/i.  Psychiatric: He has a normal mood and affect. His behavior is normal. Judgmentand thought contentnormal  Assessment/Plan: 1. Functional deficits secondary to right transmetatarsal amputation which require 3+  hours per day of interdisciplinary therapy in a comprehensive inpatient rehab setting. Physiatrist is providing close team supervision and 24 hour management of active medical problems listed below. Physiatrist and rehab team continue to assess barriers to discharge/monitor patient progress toward functional and medical goals.  Function:  Bathing Bathing position   Position: Shower  Bathing parts Body parts bathed by patient: Right arm, Left arm, Chest, Abdomen, Front perineal area, Buttocks, Right upper leg, Left upper leg, Left lower leg Body parts bathed by helper: Back  Bathing assist Assist Level: Supervision or verbal cues      Upper Body Dressing/Undressing Upper body dressing   What is the patient wearing?: Pull over shirt/dress     Pull over shirt/dress - Perfomed by patient: Thread/unthread right sleeve, Thread/unthread left sleeve, Put head through opening, Pull shirt over trunk          Upper body assist Assist Level: Supervision or verbal cues   Set up : To obtain clothing/put away  Lower Body Dressing/Undressing Lower body dressing   What is the patient wearing?: Underwear, Pants, Non-skid slipper socks Underwear - Performed by patient: Thread/unthread right underwear leg, Thread/unthread left underwear leg, Pull underwear up/down   Pants- Performed by patient: Thread/unthread right pants leg, Thread/unthread left pants leg, Pull pants up/down   Non-skid slipper socks- Performed by patient: Don/doff left sock  Lower body assist Assist for lower body dressing: Supervision or verbal cues   Set up : To obtain clothing/put away  Toileting Toileting   Toileting steps completed by patient: Performs perineal hygiene, Adjust clothing prior to toileting Toileting steps completed by helper: Adjust clothing prior to toileting, Performs perineal hygiene, Adjust clothing after toileting (per [employer], NT) Toileting Assistive Devices: Grab bar  or rail  Toileting assist Assist level: Touching or steadying assistance (Pt.75%)   Transfers Chair/bed transfer   Chair/bed transfer method: Ambulatory Chair/bed transfer assist level: Supervision or verbal cues Chair/bed transfer assistive device: Walker, Air cabin crew     Max distance: 90 Assist level: Supervision or verbal cues   Wheelchair   Type: Manual Max wheelchair distance: 150 Assist Level: No help, No cues, assistive device, takes more than reasonable amount of time  Cognition Comprehension Comprehension assist level: Follows complex conversation/direction with extra time/assistive device  Expression Expression assist level: Expresses complex ideas: With extra time/assistive device  Social Interaction Social Interaction assist level: Interacts appropriately with others - No medications needed.  Problem Solving Problem solving assist level: Solves complex problems: With extra time  Memory Memory assist level: Complete Independence: No helper     Medical Problem List and Plan: 1.  Debility secondary to PAD right transmetatarsal amputation secondary to osteomyelitis 07/24/2016/multi-medical. Nonweightbearing right lower extremity  Cont CIR 2.  DVT Prophylaxis/Anticoagulation: Brilinta 3. Pain Management: Percocet and Robaxin as needed. 4. Acute blood loss anemia.   Hb 8.9 on 8/23  Labs ordered for Monday  Cont to monitor 5. Neuropsych: This patient is capable of making decisions on his own behalf. 6. Skin/Wound Care:   Small areas of dehiscence in TMA   Cont dressing changes 7. Fluids/Electrolytes/Nutrition: Routine I&O's  8. ID. Continue intravenous Teflaro 07/25/2016 3 weeks total (per ID) 9. CAD. Status post stenting 07/28/2016. Follow-up cardiology services 10. Chronic combined systolic and diastolic congestive heart failure. Monitor for any signs of fluid overload Filed Weights   08/05/16 0622 08/06/16 0500 08/07/16 0634  Weight:  (!) 136.6 kg (301 lb 2.4 oz) 133.2 kg (293 lb 10.4 oz) 135.9 kg (299 lb 9.7 oz)   11. Diabetes mellitus with peripheral neuropathy. Hemoglobin A1c 9.5. NPH insulin 20 units a.m. 22 units daily at bedtime. Check blood sugars before meals and at bedtime with diabetic teaching  Will adjust with increased activity  Improving, will cont to monitor 12. Hypertension. Imdur 90 mg daily, Toprol-XL 75 mg daily, hydralazine 37.5 mg twice a day, Cardura 8 mg daily, Norvasc 10 mg daily.   Monitor with increased mobility  Fairly well controlled at present 13. CRI. Creatinine 1.60-1.78.   Cr. 1.80 on 8/23  Labs ordered for Monday 14. Hyperlipidemia. Crestor 15. Morbid obesity  Body mass index is 44.24 kg/m.  Diet and exercise education  Will cont to encourage weight loss to increase endurance and promote overall health  LOS (Days) 4 A FACE TO FACE EVALUATION WAS PERFORMED  Shandon Matson Lorie Phenix 08/07/2016 10:44 AM

## 2016-08-07 NOTE — Progress Notes (Signed)
Occupational Therapy Session Note  Patient Details  Name: Joshua [name] MRN: [id number] Date of Birth: 09-Feb-1956  Today's Date: 08/07/2016 OT Individual Time: 1016-1101 OT Individual Time Calculation (min): 45 min     Short Term Goals: Week 1:  OT Short Term Goal 1 (Week 1): STGs=LTGs secondary to short estimated LOS  Skilled Therapeutic Interventions/Progress Updates:   Pt participated in skilled OT tx focusing on balance, activity tolerance, adherence to NWB precautions, and d/c planning for home. Pt had PIC line in right arm at start of tx. Pt was agreeable to complete bathing/dressing while seated at sink. Bed<w/c transfer completed with Min A for balance. Bathing completed with Min A for back only. Pt able to complete pericare via lateral leans. LB dressing completed via lateral leans also. Nursing notified for UB dressing for threading PIC line into shirt. Pt educated on bathing options for home including methods for setup of sponge bathing in kitchen area due to w/c inaccessibility in bathroom. Pt verbalized understanding. At end of session, pt was left in w/c with all needs within reach. No c/o pain.   Therapy Documentation Precautions:  Precautions Precautions: Fall Precaution Comments: recent PCI for unstable angina, declining CABG Restrictions Weight Bearing Restrictions: Yes RUE Weight Bearing: Weight bearing as tolerated RLE Weight Bearing: Non weight bearing Other Position/Activity Restrictions: able to maintain during ambulation General:   Vital Signs: Therapy Vitals Temp: 98.7 F (37.1 C) Temp Source: Oral Pulse Rate: 74 Resp: 17 BP: 131/71 Patient Position (if appropriate): Lying Oxygen Therapy SpO2: 99 % O2 Device: Not Delivered :    See Function Navigator for Current Functional Status.   Therapy/Group: Individual Therapy  Budd Freiermuth A Mahli Glahn 08/07/2016, 5:12 PM

## 2016-08-07 NOTE — Progress Notes (Signed)
At the beginning of the shift, noted patients shirt had quite a bit of dried blood on it. He stated that it was from his heparin injections which are Q8hrs. On 08/04/16, he also had dried blood on his shirt. There was a gauze dressing where he had bled through it. When injections given last night & this morning, area was covered with a folded gauze to add some pressure after applying pressure manually, & then covered by a foam drsg. At the time when pressure was applied, no bleeding noted & previous drsg had a small spot of blood on it. Dr.Patel was notified this AM & verified order. Will pass on to oncoming nurse.

## 2016-08-08 ENCOUNTER — Inpatient Hospital Stay (HOSPITAL_COMMUNITY): Payer: BLUE CROSS/BLUE SHIELD | Admitting: Physical Therapy

## 2016-08-08 ENCOUNTER — Inpatient Hospital Stay (HOSPITAL_COMMUNITY): Payer: Self-pay | Admitting: Physical Therapy

## 2016-08-08 LAB — GLUCOSE, CAPILLARY
Glucose-Capillary: 111 mg/dL — ABNORMAL HIGH (ref 65–99)
Glucose-Capillary: 152 mg/dL — ABNORMAL HIGH (ref 65–99)
Glucose-Capillary: 94 mg/dL (ref 65–99)
Glucose-Capillary: 170 mg/dL — ABNORMAL HIGH (ref 65–99)

## 2016-08-08 NOTE — Progress Notes (Signed)
[facility] PHYSICAL MEDICINE & REHABILITATION     PROGRESS NOTE  Subjective/Complaints:  Pt seen laying in bed this AM with CPAP in place.  He had a good night and would like to rest some more.   ROS: Denies CP, SOB, N/V/D.  Objective: Vital Signs: Blood pressure 136/68, pulse 69, temperature 98.6 F (37 C), temperature source Oral, resp. rate 19, height $RemoveBe'5\' 9"'suRaFnIyE$  (1.753 m), weight (!) 137.1 kg (302 lb 4 oz), SpO2 95 %. No results found. No results for input(s): WBC, HGB, HCT, PLT in the last 72 hours. No results for input(s): NA, K, CL, GLUCOSE, BUN, CREATININE, CALCIUM in the last 72 hours.  Invalid input(s): CO CBG (last 3)   Recent Labs  08/07/16 1644 08/07/16 2055 08/08/16 0639  GLUCAP 96 184* 111*    Wt Readings from Last 3 Encounters:  08/08/16 (!) 137.1 kg (302 lb 4 oz)  08/03/16 135.3 kg (298 lb 4.5 oz)  02/16/16 134.7 kg (297 lb)    Physical Exam:  BP 136/68 (BP Location: Left Arm)   Pulse 69   Temp 98.6 F (37 C) (Oral)   Resp 19   Ht $R'5\' 9"'Am$  (1.753 m)   Wt (!) 137.1 kg (302 lb 4 oz)   SpO2 95%   BMI 44.63 kg/m  Constitutional:  He appears well-developed. Obese. NAD. Vital signs reviewed.  HENT: Normocephalicand atraumatic.  Eyes: Conjunctivaeand EOMare normal.  Cardiovascular: Normal rateand regular rhythm. No murmurheard. Respiratory: Effort normaland breath sounds normal. No respiratory distress. He has no wheezes.  GI: Soft. Bowel sounds are normal. He exhibits no distension. There is no tenderness.  Musculoskeletal:  Right TMA He exhibits no tenderness.  Edema right foot.   Neurological: He is alertand oriented Alert and oriented.  Follows all commands.  UE 5/5. RLE: 5/5HF, ADF/PF 3/5.   LLE: 5/5 prox to distal.  Skin: Skin is warmand dry.  Incision with dressing c/d/i.  Psychiatric: He has a normal mood and affect. His behavior is normal. Judgmentand thought contentnormal  Assessment/Plan: 1. Functional deficits secondary to right  transmetatarsal amputation which require 3+ hours per day of interdisciplinary therapy in a comprehensive inpatient rehab setting. Physiatrist is providing close team supervision and 24 hour management of active medical problems listed below. Physiatrist and rehab team continue to assess barriers to discharge/monitor patient progress toward functional and medical goals.  Function:  Bathing Bathing position   Position: Wheelchair/chair at sink  Bathing parts Body parts bathed by patient: Right arm, Left arm, Chest, Abdomen, Front perineal area, Buttocks, Right upper leg, Left upper leg, Right lower leg, Left lower leg Body parts bathed by helper: Back  Bathing assist Assist Level: Supervision or verbal cues      Upper Body Dressing/Undressing Upper body dressing   What is the patient wearing?: Pull over shirt/dress     Pull over shirt/dress - Perfomed by patient: Thread/unthread right sleeve, Thread/unthread left sleeve, Put head through opening, Pull shirt over trunk          Upper body assist Assist Level: Supervision or verbal cues   Set up : To obtain clothing/put away  Lower Body Dressing/Undressing Lower body dressing   What is the patient wearing?: Underwear, Pants, Non-skid slipper socks Underwear - Performed by patient: Thread/unthread right underwear leg, Thread/unthread left underwear leg, Pull underwear up/down   Pants- Performed by patient: Thread/unthread right pants leg, Thread/unthread left pants leg, Pull pants up/down   Non-skid slipper socks- Performed by patient: Don/doff left sock  Lower body assist Assist for lower body dressing: Supervision or verbal cues   Set up : To obtain clothing/put away  Toileting Toileting   Toileting steps completed by patient: Performs perineal hygiene, Adjust clothing prior to toileting Toileting steps completed by helper: Adjust clothing prior to toileting, Performs perineal hygiene, Adjust clothing  after toileting (per [employer], NT) Toileting Assistive Devices: Grab bar or rail  Toileting assist Assist level: Touching or steadying assistance (Pt.75%)   Transfers Chair/bed transfer   Chair/bed transfer method: Ambulatory Chair/bed transfer assist level: Touching or steadying assistance (Pt > 75%) Chair/bed transfer assistive device: Walker, Air cabin crew     Max distance: 90 Assist level: Supervision or verbal cues   Wheelchair   Type: Manual Max wheelchair distance: 150 Assist Level: No help, No cues, assistive device, takes more than reasonable amount of time  Cognition Comprehension Comprehension assist level: Follows complex conversation/direction with no assist  Expression Expression assist level: Expresses complex ideas: With no assist  Social Interaction Social Interaction assist level: Interacts appropriately with others - No medications needed.  Problem Solving Problem solving assist level: Solves complex problems: Recognizes & self-corrects  Memory Memory assist level: Complete Independence: No helper     Medical Problem List and Plan: 1.  Debility secondary to PAD right transmetatarsal amputation secondary to osteomyelitis 07/24/2016/multi-medical. Nonweightbearing right lower extremity  Cont CIR, making progress 2.  DVT Prophylaxis/Anticoagulation: Brilinta 3. Pain Management: Percocet and Robaxin as needed. 4. Acute blood loss anemia.   Hb 8.9 on 8/23   Labs ordered for Monday  Cont to monitor 5. Neuropsych: This patient is capable of making decisions on his own behalf. 6. Skin/Wound Care:   Small areas of dehiscence in TMA   Cont dressing changes 7. Fluids/Electrolytes/Nutrition: Routine I&O's  8. ID. Continue intravenous Teflaro 07/25/2016 3 weeks total (per ID) 9. CAD. Status post stenting 07/28/2016. Follow-up cardiology services 10. Chronic combined systolic and diastolic congestive heart failure. Monitor for any signs  of fluid overload Filed Weights   08/06/16 0500 08/07/16 0634 08/08/16 0634  Weight: 133.2 kg (293 lb 10.4 oz) 135.9 kg (299 lb 9.7 oz) (!) 137.1 kg (302 lb 4 oz)   11. Diabetes mellitus with peripheral neuropathy. Hemoglobin A1c 9.5. NPH insulin 20 units a.m. 22 units daily at bedtime. Check blood sugars before meals and at bedtime with diabetic teaching  Will adjust with increased activity  Improving, will cont to monitor 12. Hypertension. Imdur 90 mg daily, Toprol-XL 75 mg daily, hydralazine 37.5 mg twice a day, Cardura 8 mg daily, Norvasc 10 mg daily.   Monitor with increased mobility  Fairly well controlled at present 13. CRI. Creatinine 1.60-1.78.   Cr. 1.80 on 8/23  Labs ordered for Monday 14. Hyperlipidemia. Crestor 15. Morbid obesity  Body mass index is 44.63 kg/m.  Diet and exercise education  Will cont to encourage weight loss to increase endurance and promote overall health  LOS (Days) 5 A FACE TO FACE EVALUATION WAS PERFORMED  Taneasha Fuqua Lorie Phenix 08/08/2016 7:29 AM

## 2016-08-08 NOTE — Progress Notes (Signed)
Physical Therapy Session Note  Patient Details  Name: Joshua [name] MRN: [id number] Date of Birth: 1956-06-03  Today's Date: 08/08/2016 PT Individual Time: 4401-0272 PT Individual Time Calculation (min): 75 min    Short Term Goals: Week 1:  PT Short Term Goal 1 (Week 1): = LTGs due to ELOS  Skilled Therapeutic Interventions/Progress Updates:    Pt received in w/c & agreeable to PT, denying c/o pain. Session focused on stair training, endurance training, w/c mobility and floor transfers. Pt propelled w/c throughout unit & off unit with supervision over even & uneven surfaces. Pt able to descend ramp safely with supervision but required Min A to ascend very steep incline outside. Educated pt on shifting weight anteriorly when ascending ramp. Educated pt to back into elevator instead of entering facing forwards with pt doing so with supervision A. During w/cmobility pt required rest breaks 2/2 BUE fatigue. On unit therapist instructed pt on floor transfer and provided demonstration and educated pt on situations in which he should call 9-1-1. Pt transferred w/c>couch with RW & Min A and was able to transfer on and off of the floor with close supervision while maintaining NWB R foot, but weight bearing through knee, throughout entire activity. Instructed pt to push up to sitting on stable surface. Pt performed stair negotiation over single 4" step with RW & min A. Therapist provided cuing to slow down task when transferring RW up onto step. Pt utilized UE ergometer on level 1 x 5 minutes for endurance training. At end of session pt left sitting in w/c in room with all needs within reach.   Therapy Documentation Precautions:  Precautions Precautions: Fall Precaution Comments: recent PCI for unstable angina, declining CABG Restrictions Weight Bearing Restrictions: Yes RUE Weight Bearing: Weight bearing as tolerated RLE Weight Bearing: Non weight bearing Other Position/Activity Restrictions: able  to maintain during ambulation   See Function Navigator for Current Functional Status.   Therapy/Group: Individual Therapy  [name] 08/08/2016, 5:04 PM

## 2016-08-08 NOTE — Progress Notes (Signed)
Physical Therapy Session Note  Patient Details  Name: Joshua [name] MRN: [id number] Date of Birth: 1956/01/30  Today's Date: 08/08/2016 PT Individual Time: 0915-1025 and 1300-1340 PT Individual Time Calculation (min): 70 min and 40 min   Short Term Goals: Week 1:  PT Short Term Goal 1 (Week 1): = LTGs due to ELOS  Skilled Therapeutic Interventions/Progress Updates:    Session 1: Pt received sitting EOB, no c/o pain, and agreeable to therapy session.  Session focus on activity tolerance, dynamic standing balance, gait training, and LE strengthening.  Pt propelled w/c to [employer]	[place] for community level w/c mobility, activity tolerance, and UE strengthening mod I.  PT instructed pt in BLE therex x15 reps with 5# weights LAQ with 3 second hold and seated hip flexion.  PT instructed pt in dynamic standing balance activity focus on LLE SLS while lifting walker up/down from 4" step to simulate home entry and exit.  Pt initially required min/mod assist to maintain balance when in SLS while moving walker, progress to close supervision.  PT provided education to pt to lift walker slightly off floor and check balance before attempting to place walker up/down from step, and pt demonstrated understanding with 1 step negotiation with overall min assist.  Gait training x100' with RW and supervision.  Pt returned to room at end of session and left upright in w/c with call bell in reach and needs met.   Session 2: Pt received resting in w/c, no c/o pain, and agreeable to therapy session.  Pt propels w/c to and from therapy gym mod I.  PT instructed pt in ambulatory transfers to L side x 4 trials with min assist fade to supervision focus on slowed pace and balance.  Dynamic standing balance in L SLS focus on maintaining balance during functional activity without UE support for carryover into step negotiation.  PT instructed pt in LE therex for hip strengthening, SLS minisquat and hip abduction in supine with level 3  theraband.  Car transfer with close supervision at simulated sedan height.  Pt returned to room at end of session and positioned with call bell in reach and needs met.   Therapy Documentation Precautions:  Precautions Precautions: Fall Precaution Comments: recent PCI for unstable angina, declining CABG Restrictions Weight Bearing Restrictions: Yes RUE Weight Bearing: Weight bearing as tolerated RLE Weight Bearing: Non weight bearing Other Position/Activity Restrictions: able to maintain during ambulation   See Function Navigator for Current Functional Status.   Therapy/Group: Individual Therapy  Trenesha Alcaide E Penven-Crew 08/08/2016, 10:25 AM

## 2016-08-09 ENCOUNTER — Inpatient Hospital Stay (HOSPITAL_COMMUNITY): Payer: BLUE CROSS/BLUE SHIELD | Admitting: Physical Therapy

## 2016-08-09 ENCOUNTER — Inpatient Hospital Stay (HOSPITAL_COMMUNITY): Payer: Self-pay | Admitting: Occupational Therapy

## 2016-08-09 LAB — CBC WITH DIFFERENTIAL/PLATELET
Basophils Absolute: 0 10*3/uL (ref 0.0–0.1)
Basophils Relative: 0 %
Eosinophils Absolute: 0.1 10*3/uL (ref 0.0–0.7)
Eosinophils Relative: 2 %
HCT: 32.7 % — ABNORMAL LOW (ref 39.0–52.0)
Hemoglobin: 10.1 g/dL — ABNORMAL LOW (ref 13.0–17.0)
Lymphocytes Relative: 22 %
Lymphs Abs: 1.3 10*3/uL (ref 0.7–4.0)
MCH: 27.4 pg (ref 26.0–34.0)
MCHC: 30.9 g/dL (ref 30.0–36.0)
MCV: 88.9 fL (ref 78.0–100.0)
Monocytes Absolute: 0.5 10*3/uL (ref 0.1–1.0)
Monocytes Relative: 8 %
Neutro Abs: 4.1 10*3/uL (ref 1.7–7.7)
Neutrophils Relative %: 68 %
Platelets: 314 10*3/uL (ref 150–400)
RBC: 3.68 MIL/uL — ABNORMAL LOW (ref 4.22–5.81)
RDW: 15 % (ref 11.5–15.5)
WBC: 5.9 10*3/uL (ref 4.0–10.5)

## 2016-08-09 LAB — GLUCOSE, CAPILLARY
Glucose-Capillary: 79 mg/dL (ref 65–99)
Glucose-Capillary: 139 mg/dL — ABNORMAL HIGH (ref 65–99)
Glucose-Capillary: 115 mg/dL — ABNORMAL HIGH (ref 65–99)
Glucose-Capillary: 143 mg/dL — ABNORMAL HIGH (ref 65–99)
Glucose-Capillary: 129 mg/dL — ABNORMAL HIGH (ref 65–99)
Glucose-Capillary: 128 mg/dL — ABNORMAL HIGH (ref 65–99)
Glucose-Capillary: 108 mg/dL — ABNORMAL HIGH (ref 65–99)
Glucose-Capillary: 108 mg/dL — ABNORMAL HIGH (ref 65–99)

## 2016-08-09 LAB — BASIC METABOLIC PANEL
Anion gap: 8 (ref 5–15)
BUN: 21 mg/dL — ABNORMAL HIGH (ref 6–20)
CO2: 24 mmol/L (ref 22–32)
Calcium: 9.2 mg/dL (ref 8.9–10.3)
Chloride: 106 mmol/L (ref 101–111)
Creatinine, Ser: 2.02 mg/dL — ABNORMAL HIGH (ref 0.61–1.24)
GFR calc Af Amer: 40 mL/min — ABNORMAL LOW (ref >60)
GFR calc non Af Amer: 35 mL/min — ABNORMAL LOW (ref >60)
Glucose, Bld: 185 mg/dL — ABNORMAL HIGH (ref 65–99)
Potassium: 4.2 mmol/L (ref 3.5–5.1)
Sodium: 138 mmol/L (ref 135–145)

## 2016-08-09 NOTE — Progress Notes (Signed)
Physical Therapy Session Note  Patient Details  Name: Joshua [name] MRN: [id number] Date of Birth: 01-17-1956  Today's Date: 08/09/2016 PT Individual Time: 0900-0958 PT Individual Time Calculation (min): 58 min    Short Term Goals: Week 1:  PT Short Term Goal 1 (Week 1): = LTGs due to ELOS  Skilled Therapeutic Interventions/Progress Updates:  Pt received resting in bed, no c/o pain, and agreeable to therapy session.  Session focus on step negotiation for home entry, gait training in controlled and home environment with RW, and review of HEP.  Pt transfers sit<>stand throughout session with supervision from a variety of surfaces.  Pt continues to demonstrate poor balance with transitions when moving quickly and occasionally requires verbal cues to slow pace.  PT instructed pt in 5" step negotiation x4 trials with steady assist, 1 LOB with min assist to correct, verbal cues throughout for pacing and safety.  Gait training x100' +100' with RW and supervision for safety.  Pt performed LE therex for HEP from handout without verbal cues.  PT provided education on increasing challenge of exercises by increasing repetitions and adding weights as needed.  Discussed pt's potentially challenges at discharge and pt states he is mostly concerned about his shower head not being a hand held shower.  Discussed continuing to practice step negotiation and safety with dynamic tasks and transfers and pt agrees.  Pt returned to room at end of session and left sitting EOB with call bell in reach and needs met.      Therapy Documentation Precautions: Precautions Precautions: Fall Precaution Comments: recent PCI for unstable angina, declining CABG Restrictions Weight Bearing Restrictions: Yes RUE Weight Bearing: Weight bearing as tolerated RLE Weight Bearing: Non weight bearing Other Position/Activity Restrictions: able to maintain during ambulation   See Function Navigator for Current Functional  Status.   Therapy/Group: Individual Therapy  Chanson Teems E Penven-Crew 08/09/2016, 10:01 AM

## 2016-08-09 NOTE — Progress Notes (Signed)
[place] PHYSICAL MEDICINE & REHABILITATION     PROGRESS NOTE  Subjective/Complaints:  Pt seen laying in bed.  He states he woke up late today.  He is doing well.    ROS: Denies CP, SOB, N/V/D.  Objective: Vital Signs: Blood pressure 124/71, pulse 70, temperature 98.5 F (36.9 C), temperature source Oral, resp. rate 18, height $RemoveBe'5\' 9"'EMirqiXTn$  (1.753 m), weight 135 kg (297 lb 9.9 oz), SpO2 98 %. No results found. No results for input(s): WBC, HGB, HCT, PLT in the last 72 hours. No results for input(s): NA, K, CL, GLUCOSE, BUN, CREATININE, CALCIUM in the last 72 hours.  Invalid input(s): CO CBG (last 3)   Recent Labs  08/08/16 1638 08/08/16 2155 08/09/16 0642  GLUCAP 94 170* 129*    Wt Readings from Last 3 Encounters:  08/09/16 135 kg (297 lb 9.9 oz)  08/03/16 135.3 kg (298 lb 4.5 oz)  02/16/16 134.7 kg (297 lb)    Physical Exam:  BP 124/71 (BP Location: Left Arm)   Pulse 70   Temp 98.5 F (36.9 C) (Oral)   Resp 18   Ht $R'5\' 9"'UF$  (1.753 m)   Wt 135 kg (297 lb 9.9 oz)   SpO2 98%   BMI 43.95 kg/m  Constitutional:  He appears well-developed. Obese. NAD. Vital signs reviewed.  HENT: Normocephalicand atraumatic.  Eyes: Conjunctivaeand EOMare normal.  Cardiovascular: Normal rateand regular rhythm. No murmurheard. Respiratory: Effort normaland breath sounds normal. No respiratory distress. He has no wheezes.  GI: Soft. Bowel sounds are normal. He exhibits no distension. There is no tenderness.  Musculoskeletal:  Right TMA He exhibits no tenderness.  Edema right foot.   Neurological: He is alertand oriented Alert and oriented.  Follows all commands.  UE 5/5. RLE: 5/5HF, ADF/PF 4/5.   LLE: 5/5 prox to distal.  Skin: Skin is warmand dry.  Incision with sanginous drainage between sutures (improving).  Psychiatric: He has a normal mood and affect. His behavior is normal. Judgmentand thought contentnormal  Assessment/Plan: 1. Functional deficits secondary to right  transmetatarsal amputation which require 3+ hours per day of interdisciplinary therapy in a comprehensive inpatient rehab setting. Physiatrist is providing close team supervision and 24 hour management of active medical problems listed below. Physiatrist and rehab team continue to assess barriers to discharge/monitor patient progress toward functional and medical goals.  Function:  Bathing Bathing position   Position: Wheelchair/chair at sink  Bathing parts Body parts bathed by patient: Right arm, Left arm, Chest, Abdomen, Front perineal area, Buttocks, Right upper leg, Left upper leg, Right lower leg, Left lower leg Body parts bathed by helper: Back  Bathing assist Assist Level: Supervision or verbal cues      Upper Body Dressing/Undressing Upper body dressing   What is the patient wearing?: Pull over shirt/dress     Pull over shirt/dress - Perfomed by patient: Thread/unthread right sleeve, Thread/unthread left sleeve, Put head through opening, Pull shirt over trunk          Upper body assist Assist Level: Supervision or verbal cues   Set up : To obtain clothing/put away  Lower Body Dressing/Undressing Lower body dressing   What is the patient wearing?: Underwear, Pants, Non-skid slipper socks Underwear - Performed by patient: Thread/unthread right underwear leg, Thread/unthread left underwear leg, Pull underwear up/down   Pants- Performed by patient: Thread/unthread right pants leg, Thread/unthread left pants leg, Pull pants up/down   Non-skid slipper socks- Performed by patient: Don/doff left sock  Lower body assist Assist for lower body dressing: Supervision or verbal cues   Set up : To obtain clothing/put away  Toileting Toileting Toileting activity did not occur: N/A Toileting steps completed by patient: Performs perineal hygiene, Adjust clothing prior to toileting Toileting steps completed by helper: Adjust clothing prior to toileting,  Performs perineal hygiene, Adjust clothing after toileting (per [employer], NT) Toileting Assistive Devices: Grab bar or rail  Toileting assist Assist level: Touching or steadying assistance (Pt.75%)   Transfers Chair/bed transfer   Chair/bed transfer method: Stand pivot Chair/bed transfer assist level: Touching or steadying assistance (Pt > 75%) Chair/bed transfer assistive device: Medical sales representative     Max distance: 100 Assist level: Supervision or verbal cues   Wheelchair   Type: Manual Max wheelchair distance: >150 ft Assist Level: Touching or steadying assistance (Pt > 75%)  Cognition Comprehension Comprehension assist level: Follows complex conversation/direction with no assist  Expression Expression assist level: Expresses complex ideas: With no assist  Social Interaction Social Interaction assist level: Interacts appropriately with others - No medications needed.  Problem Solving Problem solving assist level: Solves complex problems: Recognizes & self-corrects  Memory Memory assist level: Complete Independence: No helper     Medical Problem List and Plan: 1.  Debility secondary to PAD right transmetatarsal amputation secondary to osteomyelitis 07/24/2016/multi-medical. Nonweightbearing right lower extremity  Cont CIR, making progress 2.  DVT Prophylaxis/Anticoagulation: Brilinta 3. Pain Management: Percocet and Robaxin as needed. 4. Acute blood loss anemia.   Hb 8.9 on 8/23   Labs pending  Cont to monitor 5. Neuropsych: This patient is capable of making decisions on his own behalf. 6. Skin/Wound Care:   Small areas of dehiscence in TMA, drainage improving   Cont dressing changes 7. Fluids/Electrolytes/Nutrition: Routine I&O's  8. ID. Continue intravenous Teflaro 07/25/2016 3 weeks total (per ID) 9. CAD. Status post stenting 07/28/2016. Follow-up cardiology services 10. Chronic combined systolic and diastolic congestive heart failure. Monitor  for any signs of fluid overload Filed Weights   08/07/16 0634 08/08/16 0634 08/09/16 0548  Weight: 135.9 kg (299 lb 9.7 oz) (!) 137.1 kg (302 lb 4 oz) 135 kg (297 lb 9.9 oz)   11. Diabetes mellitus with peripheral neuropathy. Hemoglobin A1c 9.5. NPH insulin 20 units a.m. 22 units daily at bedtime. Check blood sugars before meals and at bedtime with diabetic teaching  Will adjust with increased activity  Improving, will cont to monitor 12. Hypertension. Imdur 90 mg daily, Toprol-XL 75 mg daily, hydralazine 37.5 mg twice a day, Cardura 8 mg daily, Norvasc 10 mg daily.   Monitor with increased mobility  Fairly well controlled at present 13. CRI. Creatinine 1.60-1.78.   Cr. 1.80 on 8/23  Labs pending 14. Hyperlipidemia. Crestor 15. Morbid obesity  Body mass index is 43.95 kg/m.  Diet and exercise education  Will cont to encourage weight loss to increase endurance and promote overall health  LOS (Days) 6 A FACE TO FACE EVALUATION WAS PERFORMED  Shadeed Colberg Lorie Phenix 08/09/2016 9:18 AM

## 2016-08-09 NOTE — Progress Notes (Signed)
Occupational Therapy Session Note  Patient Details  Name: Joshua [name] MRN: [id number] Date of Birth: 1956/05/31  Today's Date: 08/09/2016 OT Individual Time: 3692-2300 and 1117-1209 OT Individual Time Calculation (min): 92 min and 52 min   Short Term Goals: Week 1:  OT Short Term Goal 1 (Week 1): STGs=LTGs secondary to short estimated LOS  Skilled Therapeutic Interventions/Progress Updates:   Pt participated in skilled OT tx focusing on balance, safety awareness, and adherence to precautions during self care completion. Pt completed shower in tub room today. W/c>tub bench completed with RW and supervision. Pt powers up with L LE centrally located which helps with improved steadiness during sit<stand. PIC line and R foot covered. Bathing completed with supervision and pt laterally leaning for thoroughness with pericare. UB/LB dressing completed w/c level standing as needed with RW and supervision. Pt self propelled back to room to complete grooming/oral care at sink w/c level. Pt setup for lunch with all needs within reach at end of session.   2nd Session 1:1 Tx (92 minutes) Pt participated in skilled OT session focusing on UE strengthening, activity tolerance, w/c mobility in community setting and safety. Pt was sitting up in w/c at time of skilled OT arrival with [facility] line. Pt self propelled to gym to complete individualized HEP while seated and waiting for IV team to remove PIC line. Pt completed with Min A for carryover of proper technique and use of green theraband. Therapeutic use of music utilized to increase motivation and psychosocial wellness. After exercises, pts IV was removed and pt self propelled to 1st floor gift shop where pt navigated w/c through tight spaces and around environmental barriers in community setting with min instruction on w/c safety. Further DME needs discussed with pt reporting only requiring TTB in home. Pt self propelled back to room and was left in w/c with all  needs within reach.     Therapy Documentation Precautions:  Precautions Precautions: Fall Precaution Comments: recent PCI for unstable angina, declining CABG Restrictions Weight Bearing Restrictions: Yes RUE Weight Bearing: Weight bearing as tolerated RLE Weight Bearing: Non weight bearing Other Position/Activity Restrictions: able to maintain during ambulation   Vital Signs: Therapy Vitals Temp: 98 F (36.7 C) Temp Source: Oral Pulse Rate: 65 Resp: 16 BP: 117/65 Patient Position (if appropriate): Sitting Oxygen Therapy SpO2: 97 % Pain: No c/o pain       See Function Navigator for Current Functional Status.   Therapy/Group: Individual Therapy  Mayzee Reichenbach A Fatumata Kashani 08/09/2016, 4:07 PM

## 2016-08-10 ENCOUNTER — Inpatient Hospital Stay (HOSPITAL_COMMUNITY): Payer: BLUE CROSS/BLUE SHIELD | Admitting: Occupational Therapy

## 2016-08-10 ENCOUNTER — Inpatient Hospital Stay (HOSPITAL_COMMUNITY): Payer: Self-pay | Admitting: Physical Therapy

## 2016-08-10 DIAGNOSIS — N179 Acute kidney failure, unspecified: Secondary | ICD-10-CM

## 2016-08-10 LAB — GLUCOSE, CAPILLARY
Glucose-Capillary: 108 mg/dL — ABNORMAL HIGH (ref 65–99)
Glucose-Capillary: 96 mg/dL (ref 65–99)
Glucose-Capillary: 87 mg/dL (ref 65–99)
Glucose-Capillary: 112 mg/dL — ABNORMAL HIGH (ref 65–99)

## 2016-08-10 NOTE — Progress Notes (Signed)
Physical Therapy Session Note  Patient Details  Name: Joshua [name] MRN: [id number] Date of Birth: 01/09/1956  Today's Date: 08/10/2016 PT Individual Time: 1415-1515 PT Individual Time Calculation (min): 60 min    Short Term Goals:Week 1:  PT Short Term Goal 1 (Week 1): = LTGs due to ELOS  Skilled Therapeutic Interventions/Progress Updates:    Pt received resting in bed, no c/o pain, and agreeable to therapy session.  Session focus on standing balance during functional activity, review of LE HEP, UE strengthening, and transfers.  Attempted kitchen task of using a pitcher of water to fill a glass, but once standing pt states he usually drinks bottled water or has plastic cups on the table which he fills from the sink.  PT provided education for maintaining walker safe distance from surfaces in kitchen if he needed to move things from one side of the kitchen to the other and discussed sliding items on countertops rather than attempting to carry with RW (pt had attempted to carry pitcher while moving RW).  Pt verbalized understanding.  Pt reviewed HEP and performed each exercise 2x10 reps (LAQ, standing hip abduction, hamstring curls, and LLE SLS minisquat).  UEB x10 minutes on random program (forwards x5 and backwards x5) for UE strengthening and overall endurance.  Car transfer with RW, ambulatory approach, mod I.  Pt returned to room and left upright in w/c with call bell in reach and needs met.   Therapy Documentation Precautions:  Precautions Precautions: Fall Precaution Comments: recent PCI for unstable angina, declining CABG Restrictions Weight Bearing Restrictions: Yes RUE Weight Bearing: Weight bearing as tolerated RLE Weight Bearing: Non weight bearing Other Position/Activity Restrictions: able to maintain during ambulation  See Function Navigator for Current Functional Status.   Therapy/Group: Individual Therapy  Earnest Conroy Penven-Crew 08/10/2016, 2:48 PM

## 2016-08-10 NOTE — Progress Notes (Signed)
Occupational Therapy Session Note  Patient Details  Name: Joshua [name] MRN: [id number] Date of Birth: Aug 13, 1956  Today's Date: 08/10/2016 OT Individual Time: 0930-1040 OT Individual Time Calculation (min): 70 min     Short Term Goals: Week 1:  OT Short Term Goal 1 (Week 1): STGs=LTGs secondary to short estimated LOS  Skilled Therapeutic Interventions/Progress Updates:    Upon entering the room, pt supine in bed with no c/o pain this session. Pt performed bed mobility from flat bed with increased time. Pt performed stand pivot transfer with use of RW and supervision. Pt declined toileting, bathing, and dressing this session. Pt propelled wheelchair to dayroom 150'+ with increased time. Obstacle course constructed with furniture and pt preformed side steps with RW in order to manage tight spaces. Pt required close supervision and rest break . After resting, pt performed 3 sets of 10 chair push ups for B UE strengthening. Pt completed course again and returning to wheelchair at end of session. Pt propelled wheelchair back to room and call bell and all needed items within reach upon exiting the room.   Therapy Documentation Precautions:  Precautions Precautions: Fall Precaution Comments: recent PCI for unstable angina, declining CABG Restrictions Weight Bearing Restrictions: Yes RUE Weight Bearing: Weight bearing as tolerated RLE Weight Bearing: Non weight bearing Other Position/Activity Restrictions: able to maintain during ambulation   See Function Navigator for Current Functional Status.   Therapy/Group: Individual Therapy  Phineas Semen 08/10/2016, 12:38 PM

## 2016-08-10 NOTE — Progress Notes (Signed)
Physical Therapy Session Note  Patient Details  Name: Joshua [name] MRN: [id number] Date of Birth: 04-30-1956  Today's Date: 08/10/2016 PT Individual Time: 1100-1200 PT Individual Time Calculation (min): 60 min    Short Term Goals: Week 1:  PT Short Term Goal 1 (Week 1): = LTGs due to ELOS  Skilled Therapeutic Interventions/Progress Updates:    Pt received resting in w/c with no c/o pain and agreeable to therapy session.  Session focus on balance, gait training, stair negotiation, and core/upper body strength.  Pt propels w/c mod I to/from therapy gym.  Dynamic sitting balance without UE/LE support reaching laterally and across midline for cups focus on posture and core activation.  PT instructed pt in step negotiation 2x1 step with rest break between with RW and close supervision.  No LOB with step negotiation this session.  Gait training x60' +40' +100' with RW and close supervision.  PT instructed pt in UE therex for strengthening and core activation sitting edge of mat without back support x10 reps for shoulders, triceps, and biceps with level 3 theraband.  PT discussed upcoming d/c with pt, and pt states he feels confident with his ambulation and transfers and is not worried about falling at home.  Pt returned to room at end of session and left upright in w/c with call bell in reach and needs met.   Therapy Documentation Precautions:  Precautions Precautions: Fall Precaution Comments: recent PCI for unstable angina, declining CABG Restrictions Weight Bearing Restrictions: Yes RUE Weight Bearing: Weight bearing as tolerated RLE Weight Bearing: Non weight bearing Other Position/Activity Restrictions: able to maintain during ambulation   See Function Navigator for Current Functional Status.   Therapy/Group: Individual Therapy  [name] 08/10/2016, 12:12 PM

## 2016-08-10 NOTE — Progress Notes (Signed)
[place] PHYSICAL MEDICINE & REHABILITATION     PROGRESS NOTE  Subjective/Complaints:  Pt laying in bed this AM.   He slept well overnight.  Does not require anything at present.   ROS: Denies CP, SOB, N/V/D.  Objective: Vital Signs: Blood pressure 125/65, pulse 70, temperature 98.7 F (37.1 C), temperature source Oral, resp. rate 16, height $RemoveBe'5\' 9"'ymynNdzPC$  (1.753 m), weight 133.6 kg (294 lb 8.6 oz), SpO2 99 %. No results found.  Recent Labs  08/09/16 1034  WBC 5.9  HGB 10.1*  HCT 32.7*  PLT 314    Recent Labs  08/09/16 1034  NA 138  K 4.2  CL 106  GLUCOSE 185*  BUN 21*  CREATININE 2.02*  CALCIUM 9.2   CBG (last 3)   Recent Labs  08/09/16 1632 08/09/16 2112 08/10/16 0617  GLUCAP 108* 108* 108*    Wt Readings from Last 3 Encounters:  08/10/16 133.6 kg (294 lb 8.6 oz)  08/03/16 135.3 kg (298 lb 4.5 oz)  02/16/16 134.7 kg (297 lb)    Physical Exam:  BP 125/65 (BP Location: Left Arm)   Pulse 70   Temp 98.7 F (37.1 C) (Oral)   Resp 16   Ht $R'5\' 9"'MY$  (1.753 m)   Wt 133.6 kg (294 lb 8.6 oz)   SpO2 99%   BMI 43.50 kg/m  Constitutional:  He appears well-developed. Obese. NAD. Vital signs reviewed.  HENT: Normocephalicand atraumatic.  Eyes: Conjunctivaeand EOMare normal.  Cardiovascular: Normal rateand regular rhythm. No murmurheard. Respiratory: Effort normaland breath sounds normal. No respiratory distress. He has no wheezes.  GI: Soft. Bowel sounds are normal. He exhibits no distension. There is no tenderness.  Musculoskeletal:  Right TMA He exhibits no tenderness.  Edema right foot.   Neurological: He is alertand oriented Alert and oriented.  Follows all commands.  UE 5/5. RLE: 5/5HF, ADF/PF 4/5.   LLE: 5/5 prox to distal.  Skin: Skin is warmand dry.  Incision with sanginous drainage between sutures (improving).  Psychiatric: He has a normal mood and affect. His behavior is normal. Judgmentand thought contentnormal  Assessment/Plan: 1.  Functional deficits secondary to right transmetatarsal amputation which require 3+ hours per day of interdisciplinary therapy in a comprehensive inpatient rehab setting. Physiatrist is providing close team supervision and 24 hour management of active medical problems listed below. Physiatrist and rehab team continue to assess barriers to discharge/monitor patient progress toward functional and medical goals.  Function:  Bathing Bathing position   Position: Shower  Bathing parts Body parts bathed by patient: Right arm, Left arm, Chest, Abdomen, Front perineal area, Buttocks, Right upper leg, Left upper leg, Right lower leg, Left lower leg, Back Body parts bathed by helper: Back  Bathing assist Assist Level: Supervision or verbal cues      Upper Body Dressing/Undressing Upper body dressing   What is the patient wearing?: Pull over shirt/dress     Pull over shirt/dress - Perfomed by patient: Thread/unthread right sleeve, Thread/unthread left sleeve, Put head through opening, Pull shirt over trunk          Upper body assist Assist Level: More than reasonable time   Set up : To obtain clothing/put away  Lower Body Dressing/Undressing Lower body dressing   What is the patient wearing?: Underwear, Pants, Non-skid slipper socks Underwear - Performed by patient: Thread/unthread right underwear leg, Thread/unthread left underwear leg, Pull underwear up/down   Pants- Performed by patient: Thread/unthread right pants leg, Thread/unthread left pants leg, Pull pants up/down  Non-skid slipper socks- Performed by patient: Don/doff left sock                    Lower body assist Assist for lower body dressing: Supervision or verbal cues   Set up : To obtain clothing/put away  Toileting Toileting Toileting activity did not occur: N/A Toileting steps completed by patient: Performs perineal hygiene, Adjust clothing prior to toileting Toileting steps completed by helper: Adjust clothing  prior to toileting, Performs perineal hygiene, Adjust clothing after toileting (per [employer], NT) Toileting Assistive Devices: Grab bar or rail  Toileting assist Assist level: Touching or steadying assistance (Pt.75%)   Transfers Chair/bed transfer   Chair/bed transfer method: Ambulatory Chair/bed transfer assist level: Supervision or verbal cues Chair/bed transfer assistive device: Armrests, Medical sales representative     Max distance: 100 Assist level: Supervision or verbal cues   Wheelchair   Type: Manual Max wheelchair distance: >150 ft Assist Level: Touching or steadying assistance (Pt > 75%)  Cognition Comprehension Comprehension assist level: Follows complex conversation/direction with no assist  Expression Expression assist level: Expresses complex ideas: With no assist  Social Interaction Social Interaction assist level: Interacts appropriately with others - No medications needed.  Problem Solving Problem solving assist level: Solves complex problems: Recognizes & self-corrects  Memory Memory assist level: Complete Independence: No helper     Medical Problem List and Plan: 1.  Debility secondary to PAD right transmetatarsal amputation secondary to osteomyelitis 07/24/2016/multi-medical. Nonweightbearing right lower extremity  Cont CIR, making progress 2.  DVT Prophylaxis/Anticoagulation: Brilinta 3. Pain Management: Percocet and Robaxin as needed. 4. Acute blood loss anemia.   Hb 10.1 on 8/28  Cont to monitor 5. Neuropsych: This patient is capable of making decisions on his own behalf. 6. Skin/Wound Care:   Small areas of dehiscence in TMA, drainage improving   Cont dressing changes 7. Fluids/Electrolytes/Nutrition: Routine I&O's  8. ID. Continue intravenous Teflaro 07/25/2016 3 weeks total (per ID) 9. CAD. Status post stenting 07/28/2016. Follow-up cardiology services 10. Chronic combined systolic and diastolic congestive heart failure. Monitor for  any signs of fluid overload Filed Weights   08/08/16 0634 08/09/16 0548 08/10/16 0613  Weight: (!) 137.1 kg (302 lb 4 oz) 135 kg (297 lb 9.9 oz) 133.6 kg (294 lb 8.6 oz)   11. Diabetes mellitus with peripheral neuropathy. Hemoglobin A1c 9.5. NPH insulin 20 units a.m. 22 units daily at bedtime. Check blood sugars before meals and at bedtime with diabetic teaching  Will adjust with increased activity  Improving, will cont to monitor 12. Hypertension. Imdur 90 mg daily, Toprol-XL 75 mg daily, hydralazine 37.5 mg twice a day, Cardura 8 mg daily, Norvasc 10 mg daily.   Monitor with increased mobility  Fairly well controlled at present 13. CRI. Creatinine 1.60-1.78.   AKI   Cr. 2.02 on 8/28   Encourage fluids  Cont to monitor 14. Hyperlipidemia. Crestor 15. Morbid obesity  Body mass index is 43.5 kg/m.  Diet and exercise education  Will cont to encourage weight loss to increase endurance and promote overall health  LOS (Days) 7 A FACE TO FACE EVALUATION WAS PERFORMED  Vencil Basnett Lorie Phenix 08/10/2016 8:43 AM

## 2016-08-11 ENCOUNTER — Inpatient Hospital Stay (HOSPITAL_COMMUNITY): Payer: BLUE CROSS/BLUE SHIELD | Admitting: Occupational Therapy

## 2016-08-11 ENCOUNTER — Inpatient Hospital Stay (HOSPITAL_COMMUNITY): Payer: Self-pay | Admitting: Physical Therapy

## 2016-08-11 LAB — GLUCOSE, CAPILLARY
Glucose-Capillary: 91 mg/dL (ref 65–99)
Glucose-Capillary: 116 mg/dL — ABNORMAL HIGH (ref 65–99)
Glucose-Capillary: 131 mg/dL — ABNORMAL HIGH (ref 65–99)
Glucose-Capillary: 101 mg/dL — ABNORMAL HIGH (ref 65–99)

## 2016-08-11 NOTE — Progress Notes (Signed)
Social Work Patient ID: Joshua [name], male   DOB: [birth year], 58 y.o.   MRN: [id number]   Met with pt and spoke with wife (via phone) to review team conference info.  Pt on track for d/c Friday and have scheduled for wife to be here tomorrow afternoon for family ed.  Discussion re: stop date for abx and have confirmed per ID documentation that pt will stop IV abx on day of d/c.  Pt plans to go out of town for a few days upon d/c.  Will also need to review with wife the care of his foot.  HH and DME arranged.  Angelicia Lessner, LCSW

## 2016-08-11 NOTE — Progress Notes (Signed)
Occupational Therapy Session Note  Patient Details  Name: Joshua [name] MRN: [id number] Date of Birth: 02/25/1956  Today's Date: 08/11/2016 OT Individual Time: 1000-1127 and 1430-1500 OT Individual Time Calculation (min): 87 min and 30 min     Short Term Goals: Week 1:  OT Short Term Goal 1 (Week 1): STGs=LTGs secondary to short estimated LOS  Skilled Therapeutic Interventions/Progress Updates:    Session 1: Upon entering the room, pt with no c/o pain. Pt currently connected to IV and receiving medication. Therefore, pt transferred from bed >wheelchair with use of RW and supervision. Pt obtained all clothing items from wheelchair level with mod I. Pt performed UB and LB bathing at mod I level with pt performing lateral leans for LB hygiene. Once dressed, pt propelled wheelchair to gym for B UE strengthening exercises with 30 lb resistive weights on total gym. Pt performed 2 sets of 15 lateral pull downs, chest pulls, and bicep curls. Pt required rest breaks secondary to fatigue. Pt propelled self back to room at mod I level with all needs within reach.   Session 2: Upon entering the room, pt seated in wheelchair with no c/o pain. OT reviewed B UE strengthening and stretching HEP with pt. Pt utilized green, level 3 resistive theraband and performed exercises with 2 sets of 10 reps with min verbal cues for proper technique with use of paper handout. Pt remained in wheelchair at end of session with call bell and all needed items within reach upon exiting the room.   Therapy Documentation Precautions:  Precautions Precautions: Fall Precaution Comments: recent PCI for unstable angina, declining CABG Restrictions Weight Bearing Restrictions: Yes RUE Weight Bearing: Weight bearing as tolerated RLE Weight Bearing: Non weight bearing Other Position/Activity Restrictions: able to maintain during ambulation Pain: Pain Assessment Pain Assessment: No/denies pain Pain Score: 0-No pain  See  Function Navigator for Current Functional Status.   Therapy/Group: Individual Therapy  Phineas Semen 08/11/2016, 11:59 AM

## 2016-08-11 NOTE — Patient Care Conference (Signed)
Inpatient RehabilitationTeam Conference and Plan of Care Update Date: 08/11/2016   Time: 11:30 AM    Patient Name: Joshua [name]      Medical Record Number: [id number]  Date of Birth: Jun 01, 1956 Sex: Male         Room/Bed: 4M12C/4M12C-01 Payor Info: Payor: BLUE CROSS BLUE [employer]IELD / Plan: BCBS OTHER / Product Type: *No Product type* /    Admitting Diagnosis: Transmet AMP  Admit Date/Time:  08/03/2016  6:02 PM Admission Comments: No comment available   Primary Diagnosis:  <principal problem not specified> Principal Problem: <principal problem not specified>  Patient Active Problem List   Diagnosis Date Noted  . AKI (acute kidney injury) (HCC)   . Acute blood loss anemia   . DM type 2 with diabetic peripheral neuropathy (HCC)   . Cellulitis and abscess   . S/P angioplasty with stent 07/29/16 DES to LAD ostium to prox vessel overlapping previously placed stents; DES 2nd OM 08/03/2016  . Status post transmetatarsal amputation of right foot (HCC) 08/03/2016  . Physical debility 08/03/2016  . S/P transmetatarsal amputation of foot (HCC) 08/03/2016  . Acute on chronic combined systolic and diastolic CHF, NYHA class 3 (HCC)   . Chronic osteomyelitis of right foot (HCC)   . Cellulitis and abscess of leg, except foot   . Penetrating wound of right foot   . Type 1 diabetes mellitus with right diabetic foot ulcer (HCC)   . Chronic osteomyelitis of toe of right foot (HCC)   . CKD (chronic kidney disease) stage 3, GFR 30-59 ml/min   . CKD (chronic kidney disease)   . Unstable angina pectoris (HCC) 07/18/2016  . Type II diabetes mellitus (HCC)   . Chronic combined systolic and diastolic congestive heart failure (HCC)   . Coronary artery disease   . Hyperlipemia   . Coronary artery disease involving native coronary artery of native heart without angina pectoris   . Angina pectoris (HCC)   . Intermediate coronary syndrome (HCC) 07/03/2014  . Chest pain with moderate risk of acute coronary  syndrome 06/24/2014  . CAD in native artery 06/24/2014  . Sleep apnea- on C-pap 06/24/2014  . Morbid obesity due to excess calories (HCC) 11/10/2013  . Uncontrolled diabetes mellitus with peripheral autonomic neuropathy (HCC) 10/29/2013  . Poorly controlled diabetes mellitus (HCC) 10/29/2013  . Hyperglycemia 10/29/2013  . Presence of stent in anterior descending branch of left coronary artery 10/31/2011  . Hyperlipidemia 01/12/2011  . Benign essential HTN 01/12/2011  . GERD 01/12/2011    Expected Discharge Date: Expected Discharge Date: 08/13/16  Team Members Present: Physician leading conference: Dr. Maryla Morrow Social Worker Present: Amada Jupiter, LCSW Nurse Present: Carmie End, RN PT Present: Teodoro Kil, PT OT Present: Callie Fielding, OT PPS Coordinator present : Tora Duck, RN, CRRN     Current Status/Progress Goal Weekly Team Focus  Medical   Debility secondary to PAD right transmetatarsal amputation secondary to osteomyelitis 07/24/2016/multi-medical. Nonweightbearing right lower extremity  Improve safety, mobility, transfers, wound drainage, abcess  See above   Bowel/Bladder   Cont x2; LBM 8/29 per pt  Remain cont x2   Continue monitoring patient for regular BM   Swallow/Nutrition/ Hydration             ADL's   overall supervision with functional transfers, self care at overall mod I from wheelchair level, supervision if clothing management performed in standing  mod I overall, supervision - shower transfer  d/c planning, self care retraining, balance, functional transfers, B  UE strengthening.   Mobility   close supervision for step, distant supervision transfers and gait, still with some decreased safety concerning speed of movement  mod I overall  balance, gait, step   Communication             Safety/Cognition/ Behavioral Observations            Pain   Rates pain 5-7 on 0-10 pain scale; occasional PRN percocet 1-2 tab  < 3 on 0-10 pain scale  Monitor and  treat pain qshift   Skin   Right transmetatarsal amputation with sutures and openings along incision, malodorous   Remain free from new infection/breakdown   Monitor skin qshift    Rehab Goals Patient on target to meet rehab goals: Yes *See Care Plan and progress notes for long and short-term goals.  Barriers to Discharge: CKD, CHF, ABLA, drainage from wound improving    Possible Resolutions to Barriers:  Follow wound, cont IV abx per ID, therapies    Discharge Planning/Teaching Needs:  Home with wife and adult son who can provide intermittent assistance.  Teaching with family to be scheduled.   Team Discussion:  Chronic health issues; AKI with plan to recheck labs tomorrow.  Mobility and ADLs @ distant supervision overall.  Feel he is on track for mod ind goals.  Pt with questions about swelling in left leg - MD aware.  Pt hoping to go out of town after d/c - will need to check plan for abx.  Revisions to Treatment Plan:  None   Continued Need for Acute Rehabilitation Level of Care: The patient requires daily medical management by a physician with specialized training in physical medicine and rehabilitation for the following conditions: Daily direction of a multidisciplinary physical rehabilitation program to ensure safe treatment while eliciting the highest outcome that is of practical value to the patient.: Yes Daily medical management of patient stability for increased activity during participation in an intensive rehabilitation regime.: Yes Daily analysis of laboratory values and/or radiology reports with any subsequent need for medication adjustment of medical intervention for : Post surgical problems;Renal problems;Cardiac problems;Wound care problems  Beatriz Quintela 08/11/2016, 1:45 PM

## 2016-08-11 NOTE — Progress Notes (Signed)
[facility]IC[facility]ILITATION     PROGRESS NOTE  Subjective/Complaints:  Pt doing well this AM.  He does not have any complaints and continues to feel he is making progress.   ROS: Denies CP, SOB, N/V/D.  Objective: Vital Signs: Blood pressure 128/61, pulse 72, temperature 98 F (36.7 C), temperature source Oral, resp. rate 16, height $RemoveBe'5\' 9"'HtQKdXUpb$  (1.753 m), weight 133.6 kg (294 lb 8.6 oz), SpO2 95 %. No results found.  Recent Labs  08/09/16 1034  WBC 5.9  HGB 10.1*  HCT 32.7*  PLT 314    Recent Labs  08/09/16 1034  NA 138  K 4.2  CL 106  GLUCOSE 185*  BUN 21*  CREATININE 2.02*  CALCIUM 9.2   CBG (last 3)   Recent Labs  08/10/16 1642 08/10/16 2127 08/11/16 0633  GLUCAP 87 112* 91    Wt Readings from Last 3 Encounters:  08/10/16 133.6 kg (294 lb 8.6 oz)  08/03/16 135.3 kg (298 lb 4.5 oz)  02/16/16 134.7 kg (297 lb)    Physical Exam:  BP 128/61 (BP Location: Left Arm)   Pulse 72   Temp 98 F (36.7 C) (Oral)   Resp 16   Ht $R'5\' 9"'vA$  (1.753 m)   Wt 133.6 kg (294 lb 8.6 oz)   SpO2 95%   BMI 43.50 kg/m  Constitutional:  He appears well-developed. Obese. NAD. Vital signs reviewed.  HENT: Normocephalicand atraumatic.  Eyes: Conjunctivaeand EOMare normal.  Cardiovascular: Normal rateand regular rhythm. No murmurheard. Respiratory: Effort normaland breath sounds normal. No respiratory distress. He has no wheezes.  GI: Soft. Bowel sounds are normal. He exhibits no distension. There is no tenderness.  Musculoskeletal:  Right TMA He exhibits no tenderness.  Edema right foot.   Neurological: He is alertand oriented Alert and oriented.  Follows all commands.  UE 5/5. RLE: 5/5HF, ADF/PF 4/5.   LLE: 5/5 prox to distal.  Skin: Skin is warmand dry.  Incision with sanginous drainage between sutures (improving).  Psychiatric: He has a normal mood and affect. His behavior is normal. Judgmentand thought contentnormal  Assessment/Plan: 1.  Functional deficits secondary to right transmetatarsal amputation which require 3+ hours per day of interdisciplinary therapy in a comprehensive inpatient rehab setting. Physiatrist is providing close team supervision and 24 hour management of active medical problems listed below. Physiatrist and rehab team continue to assess barriers to discharge/monitor patient progress toward functional and medical goals.  Function:  Bathing Bathing position   Position: Shower  Bathing parts Body parts bathed by patient: Right arm, Left arm, Chest, Abdomen, Front perineal area, Buttocks, Right upper leg, Left upper leg, Right lower leg, Left lower leg, Back Body parts bathed by helper: Back  Bathing assist Assist Level: Supervision or verbal cues      Upper Body Dressing/Undressing Upper body dressing   What is the patient wearing?: Pull over shirt/dress     Pull over shirt/dress - Perfomed by patient: Thread/unthread right sleeve, Thread/unthread left sleeve, Put head through opening, Pull shirt over trunk          Upper body assist Assist Level: More than reasonable time   Set up : To obtain clothing/put away  Lower Body Dressing/Undressing Lower body dressing   What is the patient wearing?: Underwear, Pants, Non-skid slipper socks Underwear - Performed by patient: Thread/unthread right underwear leg, Thread/unthread left underwear leg, Pull underwear up/down   Pants- Performed by patient: Thread/unthread right pants leg, Thread/unthread left pants leg, Pull pants up/down  Non-skid slipper socks- Performed by patient: Don/doff left sock                    Lower body assist Assist for lower body dressing: Supervision or verbal cues   Set up : To obtain clothing/put away  Toileting Toileting Toileting activity did not occur: N/A Toileting steps completed by patient: Adjust clothing prior to toileting, Performs perineal hygiene, Adjust clothing after toileting Toileting steps  completed by helper: Adjust clothing prior to toileting, Performs perineal hygiene, Adjust clothing after toileting (per [employer], NT) Toileting Assistive Devices: Grab bar or rail  Toileting assist Assist level: Touching or steadying assistance (Pt.75%)   Transfers Chair/bed transfer   Chair/bed transfer method: Stand pivot Chair/bed transfer assist level: No Help, no cues, assistive device, takes more than a reasonable amount of time Chair/bed transfer assistive device: Armrests, Medical sales representative     Max distance: 100 Assist level: Supervision or verbal cues   Wheelchair   Type: Manual Max wheelchair distance: 150 Assist Level: No help, No cues, assistive device, takes more than reasonable amount of time  Cognition Comprehension Comprehension assist level: Follows complex conversation/direction with no assist  Expression Expression assist level: Expresses complex ideas: With no assist  Social Interaction Social Interaction assist level: Interacts appropriately with others - No medications needed.  Problem Solving Problem solving assist level: Solves complex problems: Recognizes & self-corrects  Memory Memory assist level: Complete Independence: No helper     Medical Problem List and Plan: 1.  Debility secondary to PAD right transmetatarsal amputation secondary to osteomyelitis 07/24/2016/multi-medical. Nonweightbearing right lower extremity  Cont CIR, making progress 2.  DVT Prophylaxis/Anticoagulation: Brilinta 3. Pain Management: Percocet and Robaxin as needed. 4. Acute blood loss anemia.   Hb 10.1 on 8/28  Cont to monitor 5. Neuropsych: This patient is capable of making decisions on his own behalf. 6. Skin/Wound Care:   Small areas of dehiscence in TMA, drainage improving   Cont dressing changes 7. Fluids/Electrolytes/Nutrition: Routine I&O's  8. ID. Continue intravenous Teflaro 07/25/2016 3 weeks total (per ID) 9. CAD. Status post stenting  07/28/2016. Follow-up cardiology services 10. Chronic combined systolic and diastolic congestive heart failure. Monitor for any signs of fluid overload Filed Weights   08/08/16 0634 08/09/16 0548 08/10/16 0613  Weight: (!) 137.1 kg (302 lb 4 oz) 135 kg (297 lb 9.9 oz) 133.6 kg (294 lb 8.6 oz)   11. Diabetes mellitus with peripheral neuropathy. Hemoglobin A1c 9.5. NPH insulin 20 units a.m. 22 units daily at bedtime. Check blood sugars before meals and at bedtime with diabetic teaching  Will adjust with increased activity  Fairly well controlled, will cont to monitor 12. Hypertension. Imdur 90 mg daily, Toprol-XL 75 mg daily, hydralazine 37.5 mg twice a day, Cardura 8 mg daily, Norvasc 10 mg daily.   Monitor with increased mobility  Fairly well controlled at present 13. CRI. Creatinine 1.60-1.78.   AKI   Cr. 2.02 on 8/28   Encourage fluids  Labs ordered for tomorrow 14. Hyperlipidemia. Crestor 15. Morbid obesity  Body mass index is 43.5 kg/m.  Diet and exercise education  Will cont to encourage weight loss to increase endurance and promote overall health  LOS (Days) 8 A FACE TO FACE EVALUATION WAS PERFORMED  Mariellen Blaney Lorie Phenix 08/11/2016 8:30 AM

## 2016-08-11 NOTE — Progress Notes (Signed)
Physical Therapy Session Note  Patient Details  Name: Joshua [name] MRN: [id number] Date of Birth: 09-Dec-1956  Today's Date: 08/11/2016 PT Individual Time: 1300-1410 PT Individual Time Calculation (min): 70 min    Short Term Goals: Week 1:  PT Short Term Goal 1 (Week 1): = LTGs due to ELOS  Skilled Therapeutic Interventions/Progress Updates:    Pt received resting in w/c with no c/o pain and agreeable to therapy session.  Session focus on planning for upcoming discharge, patient education, w/c mobility, gait, step negotiation, and activity tolerance.  Per SW, pt plans to leave hospital and go to [place] to visit family at d/c.  Discussed with pt home set up in [place] and he reports it is the same set up (1 step onto porch and 1 level home) in [place].  Plan for pt's wife to observe therapy session tomorrow for stair negotiation and car transfers.  Pt propelled w/c outside through [employer], navigating doorways, inclines, uneven surfaces, and elevator mod I.  PT instructed pt in step negotiation 2x1 step with RW and close supervision with min verbal cues for safety, pt with no LOB during step negotiation today.  Gait training back to room x100' with Rw mod I.  Pt left in room upright in w/c with call bell in reach and needs met.   Therapy Documentation Precautions:  Precautions Precautions: Fall Precaution Comments: recent PCI for unstable angina, declining CABG Restrictions Weight Bearing Restrictions: Yes RUE Weight Bearing: Weight bearing as tolerated RLE Weight Bearing: Non weight bearing Other Position/Activity Restrictions: able to maintain during ambulation   See Function Navigator for Current Functional Status.   Therapy/Group: Individual Therapy    Whitley Strycharz E Penven-Crew 08/11/2016, 3:08 PM

## 2016-08-12 ENCOUNTER — Inpatient Hospital Stay (HOSPITAL_COMMUNITY): Payer: Self-pay | Admitting: Physical Therapy

## 2016-08-12 ENCOUNTER — Inpatient Hospital Stay (HOSPITAL_COMMUNITY): Payer: BLUE CROSS/BLUE SHIELD | Admitting: Occupational Therapy

## 2016-08-12 ENCOUNTER — Ambulatory Visit (HOSPITAL_COMMUNITY): Payer: Self-pay | Admitting: Physical Therapy

## 2016-08-12 ENCOUNTER — Inpatient Hospital Stay: Payer: Self-pay | Admitting: Infectious Disease

## 2016-08-12 LAB — CBC WITH DIFFERENTIAL/PLATELET
Basophils Absolute: 0 10*3/uL (ref 0.0–0.1)
Basophils Relative: 0 %
Eosinophils Absolute: 0.1 10*3/uL (ref 0.0–0.7)
Eosinophils Relative: 2 %
HCT: 31 % — ABNORMAL LOW (ref 39.0–52.0)
Hemoglobin: 9.5 g/dL — ABNORMAL LOW (ref 13.0–17.0)
Lymphocytes Relative: 31 %
Lymphs Abs: 1.5 10*3/uL (ref 0.7–4.0)
MCH: 27.2 pg (ref 26.0–34.0)
MCHC: 30.6 g/dL (ref 30.0–36.0)
MCV: 88.8 fL (ref 78.0–100.0)
Monocytes Absolute: 0.5 10*3/uL (ref 0.1–1.0)
Monocytes Relative: 10 %
Neutro Abs: 2.7 10*3/uL (ref 1.7–7.7)
Neutrophils Relative %: 57 %
Platelets: 263 10*3/uL (ref 150–400)
RBC: 3.49 MIL/uL — ABNORMAL LOW (ref 4.22–5.81)
RDW: 15.4 % (ref 11.5–15.5)
WBC: 4.8 10*3/uL (ref 4.0–10.5)

## 2016-08-12 LAB — GLUCOSE, CAPILLARY
Glucose-Capillary: 154 mg/dL — ABNORMAL HIGH (ref 65–99)
Glucose-Capillary: 105 mg/dL — ABNORMAL HIGH (ref 65–99)
Glucose-Capillary: 103 mg/dL — ABNORMAL HIGH (ref 65–99)
Glucose-Capillary: 140 mg/dL — ABNORMAL HIGH (ref 65–99)
Glucose-Capillary: 102 mg/dL — ABNORMAL HIGH (ref 65–99)

## 2016-08-12 LAB — BASIC METABOLIC PANEL
Anion gap: 5 (ref 5–15)
BUN: 21 mg/dL — ABNORMAL HIGH (ref 6–20)
CO2: 26 mmol/L (ref 22–32)
Calcium: 8.9 mg/dL (ref 8.9–10.3)
Chloride: 107 mmol/L (ref 101–111)
Creatinine, Ser: 1.98 mg/dL — ABNORMAL HIGH (ref 0.61–1.24)
GFR calc Af Amer: 41 mL/min — ABNORMAL LOW (ref >60)
GFR calc non Af Amer: 36 mL/min — ABNORMAL LOW (ref >60)
Glucose, Bld: 134 mg/dL — ABNORMAL HIGH (ref 65–99)
Potassium: 4 mmol/L (ref 3.5–5.1)
Sodium: 138 mmol/L (ref 135–145)

## 2016-08-12 NOTE — Progress Notes (Signed)
Physical Therapy Discharge Summary  Patient Details  Name: Joshua [name] MRN: [id number] Date of Birth: 27-May-1956  Today's Date: 08/12/2016 PT Individual Time: 1015-1130 and 1500-1538 PT Individual Time Calculation (min): 75 min and 38 min     Patient has met 10 of 10 long term goals due to improved activity tolerance, improved balance, improved postural control, increased strength, ability to compensate for deficits, improved awareness and improved coordination.  Patient to discharge at household ambulatory, community w/c  level Modified Independent.    Recommendation:  Patient will benefit from ongoing skilled PT services in home health setting to continue to advance safe functional mobility, address ongoing impairments in balance and activity tolerance, and minimize fall risk.  Equipment: w/c 18x18 with ELRs, RW  Reasons for discharge: treatment goals met  Patient/family agrees with progress made and goals achieved: Yes  Skilled Therapeutic Intervention: Session 1: Pt received resting in w/c and agreeable to therapy session, no c/o pain.  PT adjusted pt's w/c and pt transferred with RW mod I.  PT initiated discharge assessment and pt performed transfers (including car), gait x100', stair negotiation with 2 rails with mod I and step negotiation with RW mod I.  PT instructed pt in kitchen activity retrieving items from refrigerator using walker bag to move to counter.  Pt returned to room at end of session and left upright in w/c with call bell in reach and needs met.    Session 2: Pt received resting in w/c and agreeable to therapy session, family arrived at 15:10 for family education.  Pt performed LE HEP mod I 2x15 reps (LAQ, standing hip abd, hamstring curl, and single limb support mini-squat).  Pt propelled to therapy gym mod I and PT demonstrated 1 step negotiation with RW to family.  Pt's son, Jeneen Rinks, demonstrated appropriate level of supervision with correct cues for pt to  negotiate step 2 trials with RW.  Pt amb back to room mod I with RW.  PT answered all questions asked by family.  Pt left upright in w/c with call bell in reach and needs met.   PT Discharge Precautions/RestrictionsPrecautions Precautions: Fall Restrictions Weight Bearing Restrictions: Yes RUE Weight Bearing: Weight bearing as tolerated RLE Weight Bearing: Non weight bearing Other Position/Activity Restrictions: able to maintain during ambulation Vital Signs   Pain Pain Assessment Pain Assessment: 0-10 Pain Score: 0-No pain Vision/Perception     Cognition Overall Cognitive Status: Within Functional Limits for tasks assessed Arousal/Alertness: Awake/alert Orientation Level: Oriented X4 Memory: Appears intact Awareness: Appears intact Problem Solving: Appears intact Safety/Judgment: Appears intact Sensation Sensation Light Touch: Impaired Detail Light Touch Impaired Details: Impaired RLE Additional Comments: reports decreased sensation to LT ankle and below in RLE, unable to feel LT from therapist on medial border of R foot on 2 attempts Coordination Gross Motor Movements are Fluid and Coordinated: Yes Fine Motor Movements are Fluid and Coordinated: Yes Motor  Motor Motor - Discharge Observations: improving balance but continues to demonstrate deficits when fatigued  Mobility Bed Mobility Bed Mobility: Supine to Sit;Sit to Supine Supine to Sit: 6: Modified independent (Device/Increase time) Sit to Supine: 6: Modified independent (Device/Increase time) Transfers Transfers: Yes Sit to Stand: 6: Modified independent (Device/Increase time) Stand to Sit: 6: Modified independent (Device/Increase time) Locomotion  Ambulation Ambulation: Yes Ambulation/Gait Assistance: 6: Modified independent (Device/Increase time) Ambulation Distance (Feet): 100 Feet Assistive device: Rolling walker Gait Gait: Yes Stairs / Additional Locomotion Stairs: Yes Stairs Assistance: 5:  Supervision Stair Management Technique: No rails;With walker;Two rails  Number of Stairs:  (1 step with RW, 8 steps with 2 rails) Wheelchair Mobility Wheelchair Mobility: Yes Wheelchair Assistance: 6: Modified independent (Device/Increase time) Environmental health practitioner: Both upper extremities Wheelchair Parts Management: Independent Distance: 300  Trunk/Postural Assessment  Cervical Assessment Cervical Assessment: Within Functional Limits Thoracic Assessment Thoracic Assessment: Within Functional Limits Lumbar Assessment Lumbar Assessment: Within Functional Limits Postural Control Postural Control: Within Functional Limits  Balance Balance Balance Assessed: Yes Static Sitting Balance Static Sitting - Balance Support: Feet supported;Feet unsupported;No upper extremity supported Static Sitting - Level of Assistance: 7: Independent Dynamic Sitting Balance Dynamic Sitting - Balance Support: No upper extremity supported;During functional activity Dynamic Sitting - Level of Assistance: 6: Modified independent (Device/Increase time) Static Standing Balance Static Standing - Balance Support: Left upper extremity supported;Right upper extremity supported;During functional activity Static Standing - Level of Assistance: 6: Modified independent (Device/Increase time) Dynamic Standing Balance Dynamic Standing - Balance Support: Right upper extremity supported;Left upper extremity supported;During functional activity Dynamic Standing - Level of Assistance: 6: Modified independent (Device/Increase time) Extremity Assessment      RLE Assessment RLE Assessment: Within Functional Limits RLE AROM (degrees) RLE Overall AROM Comments: WFL at hip and knee, ankle not assessed due to surgery RLE Strength Right Hip Flexion: 4+/5 Right Knee Flexion: 5/5 Right Knee Extension: 5/5 LLE Assessment LLE Assessment: Within Functional Limits LLE AROM (degrees) LLE Overall AROM Comments: WFL assessed in  sitting LLE Strength Left Hip Flexion: 4+/5 Left Knee Flexion: 5/5 Left Knee Extension: 5/5 Left Ankle Dorsiflexion: 3+/5 Left Ankle Plantar Flexion: 4/5   See Function Navigator for Current Functional Status.  Tandy Grawe E Penven-Crew 08/12/2016, 1:25 PM

## 2016-08-12 NOTE — Progress Notes (Signed)
Occupational Therapy Discharge Summary  Patient Details  Name: Joshua [name] MRN: [id number] Date of Birth: 1956/07/07  Today's Date: 08/12/2016 OT Individual Time: 4166-0630 and 1415-1500 OT Individual Time Calculation (min): 60 min and 45 min     Patient has met 9 of 9 long term goals due to improved activity tolerance, improved balance and ability to compensate for deficits.  Patient to discharge at overall mod I level with supervision for shower transfer level.    Reasons goals not met: all goals met  Recommendation:  Patient will benefit from ongoing skilled OT services in home health setting to continue to advance functional skills in the area of iADL.  Equipment: Tub Producer, television/film/video  Reasons for discharge: treatment goals met  Patient/family agrees with progress made and goals achieved: Yes   OT Intervention:  Session 1: Upon entering the room, pt supine in bed with no c/o pain. Pt performed supine >sit with mod I to EOB. Pt performed bed transfer with RW and mod I. Pt propelling wheelchair to ADL apartment to demonstrate simulated shower transfer by ambulating into bathroom with RW at mod I level. Pt returning to room secondary to need for toileting. Pt performed task at mod I level as well. Pt returning to bed secondary to IV nurse arriving and OT provided education regarding [facility] OT recommendation with pt continuing to ask questions until fully understood. Pt returned to wheelchair at end of session with all needs within reach.   Session 2: Upon entering the room, pt with no c/o pain. Pt gathered all self care items from wheelchair level with mod I . Pt ambulating with RW into bathroom for bathing at shower level. Pt required supervision for shower transfer secondary to safety with increased fall risk. Pt performed task at mod I level with lateral leans in order to wash buttocks, Pt dressed from bed with mod I as well. Pt returning to wheelchair at end of session with all tasks  complete and all needs within reach.   OT Discharge Precautions/Restrictions Precautions Precautions: Fall Restrictions Weight Bearing Restrictions: Yes RUE Weight Bearing: Weight bearing as tolerated RLE Weight Bearing: Non weight bearing Other Position/Activity Restrictions: able to maintain during ambulation Pain Pain Assessment Pain Assessment: 0-10 Pain Score: 0-No pain Vision/Perception  Vision- History Patient Visual Report: No change from baseline Vision- Assessment Vision Assessment?: No apparent visual deficits  Cognition Overall Cognitive Status: Within Functional Limits for tasks assessed Arousal/Alertness: Awake/alert Orientation Level: Oriented X4 Memory: Appears intact Awareness: Appears intact Problem Solving: Appears intact Safety/Judgment: Appears intact Sensation Sensation Light Touch: Impaired Detail Light Touch Impaired Details: Impaired RLE Stereognosis: Not tested Hot/Cold: Not tested Proprioception: Appears Intact Additional Comments: reports decreased sensation to LT ankle and below in RLE, unable to feel LT from therapist on medial border of R foot on 2 attempts Coordination Gross Motor Movements are Fluid and Coordinated: Yes Fine Motor Movements are Fluid and Coordinated: Yes Motor  Motor Motor - Discharge Observations: improving functional balance but continues to demonstrate deficits when fatigued Mobility  Bed Mobility Bed Mobility: Supine to Sit;Sit to Supine Supine to Sit: 6: Modified independent (Device/Increase time) Sit to Supine: 6: Modified independent (Device/Increase time) Transfers Sit to Stand: 6: Modified independent (Device/Increase time) Stand to Sit: 6: Modified independent (Device/Increase time)  Trunk/Postural Assessment  Cervical Assessment Cervical Assessment: Within Functional Limits Thoracic Assessment Thoracic Assessment: Within Functional Limits Lumbar Assessment Lumbar Assessment: Within Functional  Limits Postural Control Postural Control: Within Functional Limits  Balance Balance Balance  Assessed: Yes Static Sitting Balance Static Sitting - Balance Support: Feet supported;Feet unsupported;No upper extremity supported Static Sitting - Level of Assistance: 7: Independent Dynamic Sitting Balance Dynamic Sitting - Balance Support: No upper extremity supported;During functional activity Dynamic Sitting - Level of Assistance: 6: Modified independent (Device/Increase time) Static Standing Balance Static Standing - Balance Support: Left upper extremity supported;Right upper extremity supported;During functional activity Static Standing - Level of Assistance: 6: Modified independent (Device/Increase time) Dynamic Standing Balance Dynamic Standing - Balance Support: Right upper extremity supported;Left upper extremity supported;During functional activity Dynamic Standing - Level of Assistance: 6: Modified independent (Device/Increase time) Extremity/Trunk Assessment RUE Assessment RUE Assessment: Within Functional Limits LUE Assessment LUE Assessment: Within Functional Limits   See Function Navigator for Current Functional Status.  Phineas Semen 08/12/2016, 2:39 PM

## 2016-08-12 NOTE — Progress Notes (Signed)
[facility]     PROGRESS NOTE  Subjective/Complaints:  Pt sitting up at the edge of the bed this AM.  He slept well and is looking forward to going to [place], [place] for the weekend.  ROS: Denies CP, SOB, N/V/D.  Objective: Vital Signs: Blood pressure 134/72, pulse 73, temperature 98.2 F (36.8 C), temperature source Oral, resp. rate 18, height $RemoveBe'5\' 9"'clmoczGGu$  (1.753 m), weight 133.6 kg (294 lb 8.6 oz), SpO2 100 %. No results found.  Recent Labs  08/09/16 1034 08/12/16 0430  WBC 5.9 4.8  HGB 10.1* 9.5*  HCT 32.7* 31.0*  PLT 314 263    Recent Labs  08/09/16 1034 08/12/16 0430  NA 138 138  K 4.2 4.0  CL 106 107  GLUCOSE 185* 134*  BUN 21* 21*  CREATININE 2.02* 1.98*  CALCIUM 9.2 8.9   CBG (last 3)   Recent Labs  08/11/16 1637 08/11/16 2011 08/12/16 0627  GLUCAP 131* 101* 154*    Wt Readings from Last 3 Encounters:  08/03/16 135.3 kg (298 lb 4.5 oz)  02/16/16 134.7 kg (297 lb)  10/14/15 127 kg (279 lb 14.4 oz)    Physical Exam:  BP 134/72 (BP Location: Left Arm)   Pulse 73   Temp 98.2 F (36.8 C) (Oral)   Resp 18   Ht $R'5\' 9"'Ke$  (1.753 m)   Wt 133.6 kg (294 lb 8.6 oz)   SpO2 100%   BMI 43.50 kg/m  Constitutional:  He appears well-developed. Obese. NAD. Vital signs reviewed.  HENT: Normocephalicand atraumatic.  Eyes: Conjunctivaeand EOMare normal.  Cardiovascular: Normal rateand regular rhythm. No murmurheard. Respiratory: Effort normaland breath sounds normal. No respiratory distress. He has no wheezes.  GI: Soft. Bowel sounds are normal. He exhibits no distension. There is no tenderness.  Musculoskeletal:  Right TMA He exhibits no tenderness.  Edema right foot (stable).   Neurological: He is alertand oriented Alert and oriented.  Follows all commands.  UE 5/5. RLE: 5/5HF, ADF/PF 4+/5.   LLE: 5/5 prox to distal.  Skin: Skin is warmand dry.  Incision with sanginous drainage between 3 sutures (continues to improve).   Psychiatric: He has a normal mood and affect. His behavior is normal. Judgmentand thought contentnormal  Assessment/Plan: 1. Functional deficits secondary to right transmetatarsal amputation which require 3+ hours per day of interdisciplinary therapy in a comprehensive inpatient rehab setting. Physiatrist is providing close team supervision and 24 hour management of active medical problems listed below. Physiatrist and rehab team continue to assess barriers to discharge/monitor patient progress toward functional and medical goals.  Function:  Bathing Bathing position   Position: Wheelchair/chair at sink  Bathing parts Body parts bathed by patient: Right arm, Left arm, Chest, Abdomen, Front perineal area, Buttocks, Right upper leg, Left upper leg, Right lower leg, Left lower leg Body parts bathed by helper: Back  Bathing assist Assist Level: More than reasonable time      Upper Body Dressing/Undressing Upper body dressing   What is the patient wearing?: Pull over shirt/dress     Pull over shirt/dress - Perfomed by patient: Thread/unthread right sleeve, Thread/unthread left sleeve, Put head through opening, Pull shirt over trunk          Upper body assist Assist Level: More than reasonable time   Set up : To obtain clothing/put away  Lower Body Dressing/Undressing Lower body dressing   What is the patient wearing?: Underwear, Pants, Non-skid slipper socks Underwear - Performed by patient: Thread/unthread right underwear  leg, Thread/unthread left underwear leg, Pull underwear up/down   Pants- Performed by patient: Thread/unthread right pants leg, Thread/unthread left pants leg, Pull pants up/down   Non-skid slipper socks- Performed by patient: Don/doff left sock                    Lower body assist Assist for lower body dressing: Supervision or verbal cues   Set up : To obtain clothing/put away  Toileting Toileting Toileting activity did not occur: N/A Toileting  steps completed by patient: Adjust clothing prior to toileting, Performs perineal hygiene, Adjust clothing after toileting Toileting steps completed by helper: Adjust clothing prior to toileting, Performs perineal hygiene, Adjust clothing after toileting (per [employer], NT) Toileting Assistive Devices: Grab bar or rail  Toileting assist Assist level: Touching or steadying assistance (Pt.75%)   Transfers Chair/bed transfer   Chair/bed transfer method: Stand pivot Chair/bed transfer assist level: No Help, no cues, assistive device, takes more than a reasonable amount of time Chair/bed transfer assistive device: Walker, Air cabin crew     Max distance: 100 Assist level: No help, No cues, assistive device, takes more than a reasonable amount of time   Wheelchair   Type: Manual Max wheelchair distance: >1000 Assist Level: No help, No cues, assistive device, takes more than reasonable amount of time  Cognition Comprehension Comprehension assist level: Follows complex conversation/direction with no assist  Expression Expression assist level: Expresses complex ideas: With no assist  Social Interaction Social Interaction assist level: Interacts appropriately with others - No medications needed.  Problem Solving Problem solving assist level: Solves complex problems: Recognizes & self-corrects  Memory Memory assist level: Complete Independence: No helper     Medical Problem List and Plan: 1.  Debility secondary to PAD right transmetatarsal amputation secondary to osteomyelitis 07/24/2016/multi-medical. Nonweightbearing right lower extremity  Cont CIR, making progress 2.  DVT Prophylaxis/Anticoagulation: Brilinta 3. Pain Management: Percocet and Robaxin as needed. 4. Acute blood loss anemia.   Hb 9.5 on 8/31  Cont to monitor 5. Neuropsych: This patient is capable of making decisions on his own behalf. 6. Skin/Wound Care:   Small areas of dehiscence in TMA,  drainage improving   Cont dressing changes 7. Fluids/Electrolytes/Nutrition: Routine I&O's  8. ID. Continue intravenous Teflaro 07/25/2016 3 weeks total (per ID) - until 9/1 9. CAD. Status post stenting 07/28/2016. Follow-up cardiology services 10. Chronic combined systolic and diastolic congestive heart failure. Monitor for any signs of fluid overload Filed Weights   08/09/16 0548 08/10/16 0613  Weight: 135 kg (297 lb 9.9 oz) 133.6 kg (294 lb 8.6 oz)   11. Diabetes mellitus with peripheral neuropathy. Hemoglobin A1c 9.5. NPH insulin 20 units a.m. 22 units daily at bedtime. Check blood sugars before meals and at bedtime with diabetic teaching  Will adjust with increased activity  Fairly well controlled, will cont to monitor 12. Hypertension. Imdur 90 mg daily, Toprol-XL 75 mg daily, hydralazine 37.5 mg twice a day, Cardura 8 mg daily, Norvasc 10 mg daily.   Monitor with increased mobility  Fairly well controlled at present 13. CRI. Creatinine 1.60-1.78.   AKI   Cr. 1.98 on 8/31   Cont to encourage fluids 14. Hyperlipidemia. Crestor 15. Morbid obesity  Body mass index is 43.5 kg/m.  Diet and exercise education  Will cont to encourage weight loss to increase endurance and promote overall health  LOS (Days) 9 A FACE TO FACE EVALUATION WAS PERFORMED  Francies Inch Lorie Phenix 08/12/2016 8:23 AM

## 2016-08-12 NOTE — Discharge Summary (Signed)
Joshua [name], Joshua [name] NO.:  [id number]  MEDICAL RECORD NO.:  [id number]  LOCATION:  4M12C                        FACILITY:  [facility]  PHYSICIAN:  Delice Lesch, MD        DATE OF BIRTH:  19-Feb-1956  DATE OF ADMISSION:  08/03/2016 DATE OF DISCHARGE:  08/13/2016                              DISCHARGE SUMMARY   DISCHARGE DIAGNOSES: 1. Debilitation secondary to right transmetatarsal amputation on     July 24, 2016. 2. Brilinta for deep venous thrombosis prophylaxis. 3. Pain management. 4. Acute blood loss anemia. 5. Coronary artery disease with stenting. 6. Chronic combined systolic and diastolic congestive heart failure. 7. Diabetes mellitus, peripheral neuropathy. 8. Hypertension. 9. Chronic renal insufficiency. 10.Hyperlipidemia. 11.Morbid obesity.  HISTORY OF PRESENT ILLNESS:  This is a 60 year old right-handed male with history of CAD, diabetes mellitus, chronic renal insufficiency, morbid obesity.  Lives with wife, 3 adult children.  Wife works during the day.  Admitted on July 18, 2016, with unstable angina.  He was placed on intravenous heparin, nitroglycerin.  Cardiac catheterization revealed 3-vessel disease with normal LV function, Dr. Servando Snare recommended CABG, but the patient adamantly declined, elected PCI. Found to have abscess in plantar soft tissue and distal phalanx with osteomyelitis of right great toe.  Infectious Disease consulted, recommended amputation.  PCI deferred until infection under control and underwent right great toe transmetatarsal amputation on July 24, 2016, by Dr. Sharol Given.  Placed on intravenous Teflaro on July 25, 2016, x3 weeks, complete on August 13, 2016.  Nonweightbearing.  Subcutaneous heparin for DVT prophylaxis.  He was hydrated for management of acute renal failure.  Underwent cardiac catheterization with PCI on July 28, 2016, maintained on aspirin and Brilinta.  The patient was admitted for a comprehensive  rehab program.  PAST MEDICAL HISTORY:  See discharge diagnoses.  SOCIAL HISTORY:  Lives with spouse and children.  Functional status upon admission to rehab services was minimal guard 22- feet rolling walker, minimal assist stand pivot transfers, min-to-mod assist activities of daily living.  PHYSICAL EXAMINATION:  VITAL SIGNS:  Blood pressure 136/70, pulse 65, temperature 98, respirations 18. GENERAL:  This was an alert male, in no acute distress, oriented x3. LUNGS:  Clear to auscultation without wheeze. CARDIAC:  Regular rate and rhythm.  No murmur. ABDOMEN:  Soft, nontender.  Good bowel sounds. EXTREMITIES:  Amputation site dressed, clean, and dry.  REHABILITATION HOSPITAL COURSE:  The patient was admitted to Inpatient Rehab Services with therapies initiated on a 3-hour daily basis, consisting of physical therapy, occupational therapy, and rehabilitation nursing.  The following issues were addressed during the patient's rehabilitation stay.  Pertaining to Joshua [name]'s right transmetatarsal amputation on July 24, 2016, remained nonweightbearing.  He would follow up with Dr. Sharol Given.  Pain management; use of Percocet and Robaxin. Acute blood loss anemia, 10.1 and monitored.  The patient had undergone stenting on July 28, 2016, maintained on Brilinta.  No chest pain or shortness of breath.  He exhibited no signs of fluid overload.  Blood pressures remained well controlled.  Subcutaneous heparin for DVT prophylaxis.  History of diabetes mellitus, peripheral neuropathy; hemoglobin A1c 9.5, insulin therapy as directed with full diabetic teaching.  He had completed a course of Teflaro per Infectious Disease, which was followed closely during amputation.  He remained afebrile. Chronic renal insufficiency; creatinine 1.60-1.78, latest creatinine 2.02.  He would follow up with his primary MD.  Morbid obesity; body mass index 43.5 kg.  Received diet and exercise education.  The  patient received weekly collaborative interdisciplinary team conferences to discuss estimated length of stay, family teaching, any barriers to his discharge.  Sessions focused on planning for his upcoming discharge, patient education, mobility, step negotiation, and activity tolerance. The patient was planning to go to [name], [place], for a short time to be with family.  The patient could propel his wheelchair, supervision.  Step negotiation with rolling walker, close supervision. Modified independent for ambulation.  He could gather his belongings for activities of daily living and homemaking.  DISCHARGE MEDICATIONS: 1. Allopurinol 300 mg p.o. daily. 2. Norvasc 10 mg p.o. daily. 3. Aspirin 81 mg p.o. daily. 4. Cardura 8 mg p.o. daily. 5. Hydralazine 37.5 mg p.o. b.i.d. 6. NPH insulin 20 units a.m., 22 units p.m. 7. Imdur 90 mg p.o. daily. 8. Robaxin 500 mg p.o. every 6 hours as needed muscle spasms. 9. Toprol-XL 75 mg p.o. daily. 10.Nitroglycerin as needed. 11.Oxycodone 5/325 one or two tablets every 4 hours as needed pain. 12.Protonix 40 mg p.o. daily. 13.Crestor 40 mg p.o. daily. 14.Brilinta 90 mg p.o. b.i.d.  DIET:  Diabetic diet.  SPECIAL INSTRUCTIONS:  Nonweightbearing, right lower extremity.  The patient would follow up with Dr. Delice Lesch, Outpatient Rehab Services as directed, Dr. Meridee Score, in 2 weeks call for appointment; Dr. Rhina Brack[place], Infectious Disease, call for appointment; Dr. [name], medical management; and Dr. Claiborne Billings, Cardiology Services.     Lauraine Rinne, P.A.   ______________________________ Delice Lesch, MD    DA/MEDQ  D:  08/12/2016  T:  08/12/2016  Job:  [id number]  cc:   [name], MD Delice Lesch, MD Audree Camel. Alyson Ingles, M.D. Alcide Evener, MD

## 2016-08-12 NOTE — Discharge Summary (Signed)
Discharge summary job [id number]

## 2016-08-13 LAB — GLUCOSE, CAPILLARY: Glucose-Capillary: 143 mg/dL — ABNORMAL HIGH (ref 65–99)

## 2016-08-13 MED ORDER — OXYCODONE-ACETAMINOPHEN 5-325 MG PO TABS: 1.0000 | ORAL_TABLET | Freq: Three times a day (TID) | ORAL | 0 refills | Status: DC | PRN

## 2016-08-13 MED ORDER — TICAGRELOR 90 MG PO TABS: 90.0000 mg | ORAL_TABLET | Freq: Two times a day (BID) | ORAL | 0 refills | Status: DC

## 2016-08-13 MED ORDER — POTASSIUM CHLORIDE CRYS ER 20 MEQ PO TBCR: 20.0000 meq | EXTENDED_RELEASE_TABLET | Freq: Every day | ORAL | 0 refills | Status: DC

## 2016-08-13 MED ORDER — PANTOPRAZOLE SODIUM 40 MG PO TBEC: 40.0000 mg | DELAYED_RELEASE_TABLET | Freq: Every day | ORAL | 0 refills | Status: DC

## 2016-08-13 MED ORDER — METOPROLOL SUCCINATE ER 25 MG PO TB24: 75.0000 mg | ORAL_TABLET | Freq: Every day | ORAL | 0 refills | Status: DC

## 2016-08-13 MED ORDER — ONDANSETRON HCL 4 MG PO TABS: 4.0000 mg | ORAL_TABLET | Freq: Three times a day (TID) | ORAL | 0 refills | Status: DC | PRN

## 2016-08-13 MED ORDER — SODIUM CHLORIDE 0.9 % IV SOLN: 600.0000 mg | Freq: Two times a day (BID) | INTRAVENOUS | 0 refills | Status: DC

## 2016-08-13 MED ORDER — AMLODIPINE BESYLATE 10 MG PO TABS: 10.0000 mg | ORAL_TABLET | Freq: Every day | ORAL | 0 refills | Status: DC

## 2016-08-13 MED ORDER — HYDRALAZINE HCL 25 MG PO TABS: 37.5000 mg | ORAL_TABLET | Freq: Two times a day (BID) | ORAL | 0 refills | Status: DC

## 2016-08-13 MED ORDER — DOXAZOSIN MESYLATE 8 MG PO TABS: 8.0000 mg | ORAL_TABLET | Freq: Every day | ORAL | 0 refills | Status: DC

## 2016-08-13 MED ORDER — ISOSORBIDE MONONITRATE ER 30 MG PO TB24: 90.0000 mg | ORAL_TABLET | Freq: Every day | ORAL | 0 refills | Status: DC

## 2016-08-13 MED ORDER — ROSUVASTATIN CALCIUM 40 MG PO TABS: 40.0000 mg | ORAL_TABLET | Freq: Every day | ORAL | 0 refills | Status: DC

## 2016-08-13 MED ORDER — INSULIN NPH (HUMAN) (ISOPHANE) 100 UNIT/ML ~~LOC~~ SUSP: SUBCUTANEOUS | 11 refills | Status: DC

## 2016-08-13 MED ORDER — ALLOPURINOL 300 MG PO TABS: 300.0000 mg | ORAL_TABLET | Freq: Every day | ORAL | 0 refills | Status: DC

## 2016-08-13 NOTE — Progress Notes (Signed)
Pt. placed on CPAP for h/s on room air, humidity was adequate, with LG/FFM, tolerating well.

## 2016-08-13 NOTE — Discharge Instructions (Signed)
Inpatient Rehab Discharge Instructions  Joshua [name] Discharge date and time: 08/13/16   Activities/Precautions/ Functional Status: Activity: Nonweightbearing right lower extremity Diet: diabetic diet Heart Healthy.  Wound Care:  Cleanse with normal saline. Pat dry. Apply dry dressing. Contact MD if you develop any problems with your incision/wound--redness, swelling, increase in pain, drainage or if you develop fever or chills.    Functional status:  ___ No restrictions     ___ Walk up steps independently ___ 24/7 supervision/assistance   ___ Walk up steps with assistance _X__ Intermittent supervision/assistance  _X__ Bathe/dress independently ___ Walk with walker     ___ Bathe/dress with assistance ___ Walk Independently    ___ Shower independently ___ Walk with assistance    ___ Shower with assistance _X__ No alcohol     ___ Return to work/school ________     COMMUNITY REFERRALS UPON DISCHARGE:    Home Health:   PT     OT    RN                     Agency:  [facility] Phone: [phone]   Medical Equipment/Items Ordered:  Wheelchair, cushion, rolling walker and tub bench                                                      Agency/Supplier:  [facility] @ [phone]   Special Instructions: 1. Check blood sugars before meals and at bedtime.  2. Check weights daily.   My questions have been answered and I understand these instructions. I will adhere to these goals and the provided educational materials after my discharge from the hospital.  Patient/Caregiver Signature _______________________________ Date __________  Clinician Signature _______________________________________ Date __________  Please bring this form and your medication list with you to all your follow-up doctor's appointments.

## 2016-08-13 NOTE — Progress Notes (Signed)
[facility] PHYSICAL MEDICINE & REHABILITATION     PROGRESS NOTE  Subjective/Complaints:  Pt sitting up in his chair this AM. He has questions regarding his discharge pain medications.  He is ready to go home.   ROS: Denies CP, SOB, N/V/D.  Objective: Vital Signs: Blood pressure 117/63, pulse 66, temperature 97.7 F (36.5 C), temperature source Oral, resp. rate 18, height $RemoveBe'5\' 9"'MyjzZnqsc$  (1.753 m), weight 128.3 kg (282 lb 14.4 oz), SpO2 97 %. No results found.  Recent Labs  08/12/16 0430  WBC 4.8  HGB 9.5*  HCT 31.0*  PLT 263    Recent Labs  08/12/16 0430  NA 138  K 4.0  CL 107  GLUCOSE 134*  BUN 21*  CREATININE 1.98*  CALCIUM 8.9   CBG (last 3)   Recent Labs  08/12/16 1629 08/12/16 2027 08/13/16 0650  GLUCAP 140* 102* 143*    Wt Readings from Last 3 Encounters:  08/12/16 128.3 kg (282 lb 14.4 oz)  08/03/16 135.3 kg (298 lb 4.5 oz)  02/16/16 134.7 kg (297 lb)    Physical Exam:  BP 117/63 (BP Location: Left Arm)   Pulse 66   Temp 97.7 F (36.5 C) (Oral)   Resp 18   Ht $R'5\' 9"'NS$  (1.753 m)   Wt 128.3 kg (282 lb 14.4 oz)   SpO2 97%   BMI 41.78 kg/m  Constitutional:  He appears well-developed. Obese. NAD. Vital signs reviewed.  HENT: Normocephalicand atraumatic.  Eyes: Conjunctivaeand EOMare normal.  Cardiovascular: Normal rateand regular rhythm. No murmurheard. Respiratory: Effort normaland breath sounds normal. No respiratory distress. He has no wheezes.  GI: Soft. Bowel sounds are normal. He exhibits no distension. There is no tenderness.  Musculoskeletal:  Right TMA He exhibits no tenderness.  Edema right foot (stable).   Neurological: He is alertand oriented Alert and oriented.  Follows all commands.  UE 5/5. RLE: 5/5HF, ADF/PF 4+/5.   LLE: 5/5 prox to distal.  Skin: Skin is warmand dry.  Incision with sanginous drainage between 3 sutures (continues to improve).  Psychiatric: He has a normal mood and affect. His behavior is normal. Judgmentand  thought contentnormal  Assessment/Plan: 1. Functional deficits secondary to right transmetatarsal amputation which require 3+ hours per day of interdisciplinary therapy in a comprehensive inpatient rehab setting. Physiatrist is providing close team supervision and 24 hour management of active medical problems listed below. Physiatrist and rehab team continue to assess barriers to discharge/monitor patient progress toward functional and medical goals.  Function:  Bathing Bathing position   Position: Shower  Bathing parts Body parts bathed by patient: Right arm, Left arm, Chest, Abdomen, Front perineal area, Buttocks, Right upper leg, Left upper leg, Right lower leg, Left lower leg Body parts bathed by helper: Back  Bathing assist Assist Level: More than reasonable time      Upper Body Dressing/Undressing Upper body dressing   What is the patient wearing?: Pull over shirt/dress     Pull over shirt/dress - Perfomed by patient: Thread/unthread right sleeve, Thread/unthread left sleeve, Put head through opening, Pull shirt over trunk          Upper body assist Assist Level: More than reasonable time   Set up : To obtain clothing/put away  Lower Body Dressing/Undressing Lower body dressing   What is the patient wearing?: Underwear, Pants, Non-skid slipper socks Underwear - Performed by patient: Thread/unthread right underwear leg, Thread/unthread left underwear leg, Pull underwear up/down   Pants- Performed by patient: Thread/unthread right pants leg, Thread/unthread left  pants leg, Pull pants up/down   Non-skid slipper socks- Performed by patient: Don/doff left sock                    Lower body assist Assist for lower body dressing: More than reasonable time   Set up : To obtain clothing/put away  Toileting Toileting Toileting activity did not occur: N/A Toileting steps completed by patient: Adjust clothing prior to toileting, Performs perineal hygiene, Adjust  clothing after toileting Toileting steps completed by helper: Adjust clothing prior to toileting, Performs perineal hygiene, Adjust clothing after toileting (per [employer], NT) Toileting Assistive Devices: Grab bar or rail  Toileting assist Assist level: More than reasonable time   Transfers Chair/bed transfer   Chair/bed transfer method: Ambulatory Chair/bed transfer assist level: No Help, no cues, assistive device, takes more than a reasonable amount of time Chair/bed transfer assistive device: Armrests, Medical sales representative     Max distance: 100 Assist level: No help, No cues, assistive device, takes more than a reasonable amount of time   Wheelchair   Type: Manual Max wheelchair distance: 300 Assist Level: No help, No cues, assistive device, takes more than reasonable amount of time  Cognition Comprehension Comprehension assist level: Follows complex conversation/direction with no assist  Expression Expression assist level: Expresses complex ideas: With no assist  Social Interaction Social Interaction assist level: Interacts appropriately with others - No medications needed.  Problem Solving Problem solving assist level: Solves complex problems: Recognizes & self-corrects  Memory Memory assist level: Complete Independence: No helper     Medical Problem List and Plan: 1.  Debility secondary to PAD right transmetatarsal amputation secondary to osteomyelitis 07/24/2016/multi-medical. Nonweightbearing right lower extremity  D/c today  Will see pt in 1-2 weeks for transitional care management 2.  DVT Prophylaxis/Anticoagulation: Brilinta 3. Pain Management: Percocet and Robaxin as needed. 4. Acute blood loss anemia.   Hb 9.5 on 8/31  Cont to monitor 5. Neuropsych: This patient is capable of making decisions on his own behalf. 6. Skin/Wound Care:   Small areas of dehiscence in TMA, drainage improving   Cont dressing changes 7. Fluids/Electrolytes/Nutrition:  Routine I&O's  8. ID. Continue intravenous Teflaro 07/25/2016 3 weeks total (per ID) - last dose today 9. CAD. Status post stenting 07/28/2016. Follow-up cardiology services 10. Chronic combined systolic and diastolic congestive heart failure. Monitor for any signs of fluid overload Filed Weights   08/10/16 0613 08/12/16 1500  Weight: 133.6 kg (294 lb 8.6 oz) 128.3 kg (282 lb 14.4 oz)   11. Diabetes mellitus with peripheral neuropathy. Hemoglobin A1c 9.5. NPH insulin 20 units a.m. 22 units daily at bedtime. Check blood sugars before meals and at bedtime with diabetic teaching  Will adjust with increased activity  Fairly well controlled, will cont to monitor 12. Hypertension. Imdur 90 mg daily, Toprol-XL 75 mg daily, hydralazine 37.5 mg twice a day, Cardura 8 mg daily, Norvasc 10 mg daily.   Monitor with increased mobility  Fairly well controlled at present 13. CRI. Creatinine 1.60-1.78.   AKI   Cr. 1.98 on 8/31   Cont to encourage fluids 14. Hyperlipidemia. Crestor 15. Morbid obesity  Body mass index is 41.78 kg/m.  Diet and exercise education  Will cont to encourage weight loss to increase endurance and promote overall health  LOS (Days) 10 A FACE TO FACE EVALUATION WAS PERFORMED  Meleana Commerford Lorie Phenix 08/13/2016 8:11 AM

## 2016-08-13 NOTE — Progress Notes (Signed)
Social Work  Discharge Note  The overall goal for the admission was met for:   Discharge location: Yes - home with wife and son who can provide intermittent assistance  Length of Stay: Yes - 10 days  Discharge activity level: Yes - modified independent  Home/community participation: Yes  Services provided included: MD, RD, PT, OT, RN, CM, Pharmacy and [facility]: Private Insurance: [place]  Follow-up services arranged: Home Health: RN, PT, OT via [facility], DME: 18x18 lightweight w/c with ELRs, cushion, rolling walker and tub bench via [facility] and Patient/Family has no preference for HH/DME agencies  Comments (or additional information):  Patient/Family verbalized understanding of follow-up arrangements: Yes  Individual responsible for coordination of the follow-up plan: pt  Confirmed correct DME delivered: Synethia Endicott 08/13/2016    Alexes Lamarque

## 2016-08-13 NOTE — Progress Notes (Signed)
Patient discharged home.  Left floor via wheelchair, escorted by nursing staff and family.  Patient verbalized understanding of discharge instructions as given by Algis Liming, PA.  All patient belongings sent with patient, including DME.  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  Patient appears to be in no immediate distress at this time.  Brita Romp, RN

## 2016-08-17 ENCOUNTER — Telehealth: Payer: Self-pay | Admitting: *Deleted

## 2016-08-17 NOTE — Telephone Encounter (Signed)
Made 1st attempt for transitional care call.  Left a VM to have patient call us back

## 2016-08-20 ENCOUNTER — Telehealth: Payer: Self-pay | Admitting: Cardiovascular Disease

## 2016-08-20 MED ORDER — POTASSIUM CHLORIDE CRYS ER 20 MEQ PO TBCR
20.0000 meq | EXTENDED_RELEASE_TABLET | Freq: Every day | ORAL | 0 refills | Status: DC
Start: 2016-08-20 — End: 2016-08-31

## 2016-08-20 MED ORDER — AMLODIPINE BESYLATE 10 MG PO TABS: 10.0000 mg | ORAL_TABLET | Freq: Every day | ORAL | 0 refills | Status: DC

## 2016-08-20 MED ORDER — ISOSORBIDE MONONITRATE ER 30 MG PO TB24: 90.0000 mg | ORAL_TABLET | Freq: Every day | ORAL | 0 refills | Status: DC

## 2016-08-20 MED ORDER — METOPROLOL SUCCINATE ER 25 MG PO TB24: 75.0000 mg | ORAL_TABLET | Freq: Every day | ORAL | 0 refills | Status: DC

## 2016-08-20 NOTE — Telephone Encounter (Signed)
New message        Pt c/o medication issue:  1. Name of Medication: amlodipine 10mg , isosorbide 90mg , metoprolol 75mg ,potassium 75meq 2. How are you currently taking this medication (dosage and times per day)?    3. Are you having a reaction (difficulty breathing--STAT)?  no 4. What is your medication issue?  Pt was recently discharged from the hosp and these medications were not called in to [facility].  If any problems, please call home health nurse back

## 2016-08-20 NOTE — Telephone Encounter (Signed)
Left msg for caller to alert that refills were submitted and to call if questions.

## 2016-08-20 NOTE — Telephone Encounter (Signed)
Medications refilled to preferred pharmacy.

## 2016-08-25 ENCOUNTER — Inpatient Hospital Stay: Payer: Self-pay | Admitting: Infectious Disease

## 2016-08-26 ENCOUNTER — Encounter
Payer: BLUE CROSS/BLUE SHIELD | Attending: Physical Medicine & Rehabilitation | Admitting: Physical Medicine & Rehabilitation

## 2016-08-26 ENCOUNTER — Encounter: Payer: Self-pay | Admitting: Physical Medicine & Rehabilitation

## 2016-08-26 VITALS — BP 127/74 | HR 81 | Resp 16

## 2016-08-26 DIAGNOSIS — I10 Essential (primary) hypertension: Secondary | ICD-10-CM

## 2016-08-26 DIAGNOSIS — M869 Osteomyelitis, unspecified: Secondary | ICD-10-CM | POA: Diagnosis present

## 2016-08-26 DIAGNOSIS — R269 Unspecified abnormalities of gait and mobility: Secondary | ICD-10-CM | POA: Diagnosis not present

## 2016-08-26 DIAGNOSIS — I2510 Atherosclerotic heart disease of native coronary artery without angina pectoris: Secondary | ICD-10-CM | POA: Insufficient documentation

## 2016-08-26 DIAGNOSIS — E1142 Type 2 diabetes mellitus with diabetic polyneuropathy: Secondary | ICD-10-CM | POA: Insufficient documentation

## 2016-08-26 DIAGNOSIS — Z9889 Other specified postprocedural states: Secondary | ICD-10-CM | POA: Insufficient documentation

## 2016-08-26 DIAGNOSIS — Z89431 Acquired absence of right foot: Secondary | ICD-10-CM | POA: Diagnosis not present

## 2016-08-26 DIAGNOSIS — N179 Acute kidney failure, unspecified: Secondary | ICD-10-CM | POA: Diagnosis not present

## 2016-08-26 DIAGNOSIS — E1165 Type 2 diabetes mellitus with hyperglycemia: Secondary | ICD-10-CM | POA: Diagnosis not present

## 2016-08-26 DIAGNOSIS — Z89421 Acquired absence of other right toe(s): Secondary | ICD-10-CM | POA: Diagnosis present

## 2016-08-26 DIAGNOSIS — I5043 Acute on chronic combined systolic (congestive) and diastolic (congestive) heart failure: Secondary | ICD-10-CM

## 2016-08-26 DIAGNOSIS — I509 Heart failure, unspecified: Secondary | ICD-10-CM | POA: Diagnosis not present

## 2016-08-26 DIAGNOSIS — S37009D Unspecified injury of unspecified kidney, subsequent encounter: Secondary | ICD-10-CM | POA: Diagnosis not present

## 2016-08-26 MED ORDER — OXYCODONE HCL 5 MG PO TABS: 5.0000 mg | ORAL_TABLET | Freq: Every day | ORAL | 0 refills | Status: DC | PRN

## 2016-08-26 NOTE — Progress Notes (Signed)
Subjective:    Patient ID: Joshua [name], male    DOB: 1956/08/23, 60 y.o.   MRN: [id number]  HPI  60 year old right-handed male with history of CAD, diabetes mellitus, chronic renal insufficiency, morbid obesity presents for hospital follow up after receiving CIR for right TMA.  At discharge he was instructed to maintain NWB RLE.  He sees Dr. Sharol Given next week.  He has an appointment to see Dr. Tommy Medal next week as well. He saw his PCP. He sees Cardiology next week. Since discharge, pt states his glucometer was misplaced and so he has not been checking it regularly.  He has been doing well.  It's a little tougher due to longer distances and some tight spaces at home.    DME: Did not receive shower chair, but states he does not need it Mobility: Wheelchair and walker.  Wheelchair appears to be damaged. Therapies: Seen by therapies, discharged  Pain Inventory Average Pain 5 Pain Right Now 4 My pain is aching  In the last 24 hours, has pain interfered with the following? General activity 1 Relation with others 0 Enjoyment of life 1 What TIME of day is your pain at its worst? varies Sleep (in general) Fair  Pain is worse with: lack of medication Pain improves with: rest and medication Relief from Meds: 7  Mobility walk with assistance use a walker how many minutes can you walk? 3 ability to climb steps?  yes do you drive?  no use a wheelchair Do you have any goals in this area?  yes  Function not employed: date last employed 03/2016 Do you have any goals in this area?  yes  Neuro/Psych trouble walking depression anxiety  Prior Studies x-rays CT/MRI hospital f/u  Physicians involved in your care hospital f/u   Family History  Problem Relation Age of Onset  . Heart attack Maternal Aunt   . Diabetes Maternal Grandmother   . Hypertension Maternal Grandmother    Social History   Social History  . Marital status: Married    Spouse name: N/A  . Number of  children: N/A  . Years of education: N/A   Social History Main Topics  . Smoking status: Never Smoker  . Smokeless tobacco: Never Used  . Alcohol use Yes     Comment: Pt sts he binge drinks on the weekends  . Drug use: No  . Sexual activity: Yes   Other Topics Concern  . None   Social History Narrative  . None   Past Surgical History:  Procedure Laterality Date  . AMPUTATION Right 07/24/2016   Procedure: TRANSMETATARSAL FOOT AMPUTATION;  Surgeon: [name], MD;  Location: [facility];  Service: Orthopedics;  Laterality: Right;  . BREAST LUMPECTOMY Right ~ 2000   "benign"  . CARDIAC CATHETERIZATION N/A 07/30/2015   Procedure: Left Heart Cath and Coronary Angiography;  Surgeon: Burnell Blanks, MD;  Location: [facility];  Service: Cardiovascular;  Laterality: N/A;  . CARDIAC CATHETERIZATION N/A 07/19/2016   Procedure: Left Heart Cath and Coronary Angiography;  Surgeon: Belva Crome, MD;  Location: [facility];  Service: Cardiovascular;  Laterality: N/A;  . CARDIAC CATHETERIZATION N/A 07/29/2016   Procedure: Coronary Stent Intervention;  Surgeon: Belva Crome, MD;  Location: [facility];  Service: Cardiovascular;  Laterality: N/A;  . CARDIAC CATHETERIZATION N/A 07/29/2016   Procedure: Intravascular Ultrasound/IVUS;  Surgeon: Belva Crome, MD;  Location: [facility];  Service: Cardiovascular;  Laterality:  N/A;  . CATARACT EXTRACTION W/ INTRAOCULAR LENS  IMPLANT, BILATERAL Bilateral 2014-2015  . CORONARY ANGIOPLASTY WITH STENT PLACEMENT  10/31/2011   30 % MID ATRIOVENTRICULAR GROOVE CX STENOSIS. 90 % MID RCA.PTA TO LAD/STENTING OF TANDEM 60-70%. LAD STENOSIS 99% REDUCED TO 0%.3.0 X 32MM PROMUS DES POSTDILATED TO 3.25MM.  [name] CORONARY ANGIOPLASTY WITH STENT PLACEMENT  07/03/2014   "2"  . I&D EXTREMITY Right 10/30/2013   Procedure: IRRIGATION AND DEBRIDEMENT RIGHT SMALL FINGER POSSIBLE SECONDARY WOUND CLOSURE;  Surgeon: [name], MD;  Location: [facility];  Service: Orthopedics;  Laterality: Right;  [name] is available after 4pm  . I&D EXTREMITY Right 11/01/2013   Procedure:  IRRIGATION AND DEBRIDEMENT RIGHT  PROXIMAL PHALANGEAL LEVEL AMPUTATION;  Surgeon: [name], MD;  Location: [facility];  Service: Orthopedics;  Laterality: Right;  . INCISION AND DRAINAGE Right 10/28/2013   Procedure: INCISION AND DRAINAGE Right small finger;  Surgeon: [name], MD;  Location: [facility];  Service: Orthopedics;  Laterality: Right;  . LEFT HEART CATH Left 11/03/2011   Procedure: LEFT HEART CATH;  Surgeon: [name], MD;  Location: [facility] CATH LAB;  Service: Cardiovascular;  Laterality: Left;  . LEFT HEART CATHETERIZATION WITH CORONARY ANGIOGRAM N/A 07/03/2014   Procedure: LEFT HEART CATHETERIZATION WITH CORONARY ANGIOGRAM;  Surgeon: [name], MD;  Location: [facility] CATH LAB;  Service: Cardiovascular;  Laterality: N/A;  . PERCUTANEOUS CORONARY STENT INTERVENTION (PCI-S) Left 11/03/2011   Procedure: PERCUTANEOUS CORONARY STENT INTERVENTION (PCI-S);  Surgeon: [name], MD;  [facility] CATH LAB;  Service: Cardiovascular;  Laterality: Left;  . TOE AMPUTATION Right ~ 2010   "2nd toe"  . TOE AMPUTATION Right 2012   "3rd toe"   Past Medical History:  Diagnosis Date  . Chronic combined systolic and diastolic CHF (congestive heart failure) ([place])    a. 07/2015 Echo: EF 45-50%, mod LVH, mid apical/antsept AK, Gr 1 DD.  [name] Coronary artery disease    a. 2012 s/p MI/PCI: s/p DES to mid/dist RCA and overlapping DES to LAD;  b. 07/2015 Cath: LAD 10% ISR, D1 40(jailed), LCX 60m, OM2 30, OM3 30, RCA 4m ISR, 10d ISR.  . Diabetic gastroparesis ([place])   . Essential hypertension   . GERD (gastroesophageal reflux disease)   . Gout   . Heart murmur   . Hyperlipemia   . Morbid obesity ([place])   . Myocardial infarction ([place]) 2012  . OSA on CPAP   . Polyneuropathy in diabetes(357.2)   . Pulmonary sarcoidosis ([place])    "problems w/it years ago; no  problems anymore" (07/03/2014)  . Status post transmetatarsal amputation of right foot ([facility]) 08/03/2016  . Type II diabetes mellitus ([facility])    BP 127/74 (BP Location: Right Arm, Patient Position: Sitting, Cuff Size: Large)   Pulse 81   Resp 16   SpO2 95%   Opioid Risk Score:   Fall Risk Score:  `1  Depression screen PHQ 2/9  Depression screen [facility] 2/9 08/26/2016 01/31/2014 12/20/2013 11/27/2013  Decreased Interest 1 0 0 0  Down, Depressed, Hopeless 2 0 0 0  PHQ - 2 Score 3 0 0 0  Altered sleeping 1 - - -  Tired, decreased energy 1 - - -  Change in appetite 1 - - -  Feeling bad or failure about yourself  2 - - -  Trouble concentrating 1 - - -  Moving slowly or fidgety/restless 0 - - -  Suicidal thoughts 1 - - -  PHQ-9 Score 10 - - -  Difficult doing work/chores Somewhat difficult - - -    Review of Systems  Constitutional: Positive for unexpected weight change.  HENT: Negative.   Eyes: Negative.   Respiratory: Positive for apnea.   Cardiovascular: Negative.   Gastrointestinal: Positive for abdominal pain.  Endocrine: Negative.        High blood sugar  Genitourinary: Negative.   Musculoskeletal: Positive for gait problem.  Allergic/Immunologic: Negative.   Psychiatric/Behavioral: Positive for dysphoric mood. The patient is nervous/anxious.   All other systems reviewed and are negative.      Objective:   Physical Exam Constitutional:  He appears well-developed. Obese. NAD. Vital signs reviewed.  HENT: Normocephalic and atraumatic.  Eyes: Conjunctivae and EOM are normal.  Cardiovascular: Normal rate and regular rhythm. No murmur heard. Respiratory: Effort normal and breath sounds normal. No respiratory distress. He has no wheezes.  GI: Soft. Bowel sounds are normal. He exhibits no distension. There is no tenderness.  Musculoskeletal:  Right TMA He exhibits no tenderness.  Edema right foot (improved).   Neurological: He is alert and oriented Follows all commands.  UE  5/5. RLE: 5/5HF, ADF/PF 4+/5.   LLE: 5/5 prox to distal.  Skin: Skin is warm and dry.  Incision with minimal sanginous drainage between 3 sutures.  Psychiatric: He has a normal mood and affect. His behavior is normal. Judgment and thought content normal    Assessment & Plan:  60 year old right-handed male with history of CAD, diabetes mellitus, chronic renal insufficiency, morbid obesity presents for hospital follow up after receiving CIR for right TMA.    1.  Right transmetatarsal amputation secondary to osteomyelitis   Graduated from therapies  Cont follow up with Ortho, ID  Cont NWB  2. Pain Management  Still present, improving  10 pills of Roxi 5 ordered  Tylenol for pain  3. Skin/Wound Care   Small areas of dehiscence, improving  Cont dressing changes  4.  CAD  Cont meds  Follow up with PCP and Cardiology  5. CHF  See above  6. Diabetes mellitus with peripheral neuropathy  Follow CBGs  Follow with PCP regarding glucometer and adjustment to meds  7. Hypertension  Cont meds  In good control today  8. AKI  While in hospital, Cr. Elevated  Pt states he may have had BMP with PCP, will order BMP if not done  9. Morbid obesity             Cont weight loss  10. Abnormality of gait  Cont NWB  Follow up with Ortho

## 2016-08-27 ENCOUNTER — Other Ambulatory Visit: Payer: Self-pay | Admitting: Nurse Practitioner

## 2016-08-27 DIAGNOSIS — I2510 Atherosclerotic heart disease of native coronary artery without angina pectoris: Secondary | ICD-10-CM

## 2016-08-31 ENCOUNTER — Encounter: Payer: Self-pay | Admitting: Nurse Practitioner

## 2016-08-31 ENCOUNTER — Ambulatory Visit (INDEPENDENT_AMBULATORY_CARE_PROVIDER_SITE_OTHER): Payer: BLUE CROSS/BLUE SHIELD | Admitting: Nurse Practitioner

## 2016-08-31 VITALS — BP 104/67 | HR 84 | Ht 69.0 in

## 2016-08-31 DIAGNOSIS — I25119 Atherosclerotic heart disease of native coronary artery with unspecified angina pectoris: Secondary | ICD-10-CM

## 2016-08-31 DIAGNOSIS — N183 Chronic kidney disease, stage 3 unspecified: Secondary | ICD-10-CM

## 2016-08-31 DIAGNOSIS — I214 Non-ST elevation (NSTEMI) myocardial infarction: Secondary | ICD-10-CM | POA: Diagnosis not present

## 2016-08-31 DIAGNOSIS — I5032 Chronic diastolic (congestive) heart failure: Secondary | ICD-10-CM

## 2016-08-31 DIAGNOSIS — I110 Hypertensive heart disease with heart failure: Secondary | ICD-10-CM

## 2016-08-31 DIAGNOSIS — E785 Hyperlipidemia, unspecified: Secondary | ICD-10-CM

## 2016-08-31 NOTE — Patient Instructions (Addendum)
Follow-Up:  Your physician recommends that you schedule a follow-up appointment in: 3 months with Dr. Claiborne Billings.      Medications:  We called in refills for: --Amlodipine 10 mg daily --Isosorbide 30 mg--3 tab daily. --Metoprolol 25 mg--3 tab daily. --Brilinta--90 mg twice daily. --Hydralazine 25 mg--1.5 tablets twice daily. --Rosuvastatin 40 mg daily.  Discontinue:  Furosemide, Klor-Con, and amlodipine-olmesartan

## 2016-08-31 NOTE — Progress Notes (Signed)
Office Visit    Patient Name: Joshua [name] Date of Encounter: 08/31/2016  Primary Care Provider:  [name], MD Primary Cardiologist:  Corky Downs, MD   Chief Complaint     60 year old male with a history of hypertension, hyperlipidemia, diabetes, and CAD status post recent non-STEMI and LAD and OM 2 stenting who presents for hospital follow-up.  Past Medical History    Past Medical History:  Diagnosis Date  . Chronic combined systolic and diastolic CHF (congestive heart failure) ([place])    a. 07/2015 Echo: EF 45-50%, mod LVH, mid apical/antsept AK, Gr 1 DD.  [name] CKD (chronic kidney disease), stage III   . Coronary artery disease    a. 2012 s/p MI/PCI: s/p DES to mid/dist RCA and overlapping DES to LAD;  b. 07/2015 Cath: LAD 10% ISR, D1 40(jailed), LCX 29m, OM2 30, OM3 30, RCA 60m ISR, 10d ISR; c. 07/2016 NSTEMI/PCI: LAD 85ost/p/m (3.5x20 Synergy DES overlapping prior stent), D1 40, LCX 27m, OM2 95 (2.75x16 Synergy DES), OM3 30, RCA 41m, 10d.  . Diabetic gastroparesis ([place])   . GERD (gastroesophageal reflux disease)   . Gout   . Heart murmur   . Hyperlipemia   . Hypertensive heart disease   . Ischemic cardiomyopathy    a. 07/2015 Echo: EF 45-50%, mod LVH, mid-apicalanteroseptal AK, Gr1 DD.  . Morbid obesity ([place])   . Myocardial infarction ([place]) 2012  . OSA on CPAP   . Osteomyelitis ([place])    a. 07/2016 MTP joint of R great toe s/p transmetatarsal amputation.  . Polyneuropathy in diabetes(357.2)   . Pulmonary sarcoidosis ([place])    "problems w/it years ago; no problems anymore" (07/03/2014)  . Type II diabetes mellitus ([place])    Past Surgical History:  Procedure Laterality Date  . AMPUTATION Right 07/24/2016   Procedure: TRANSMETATARSAL FOOT AMPUTATION;  Surgeon: [name], MD;  Location: [facility];  Service: Orthopedics;  Laterality: Right;  . BREAST LUMPECTOMY Right ~ 2000   "benign"  . CARDIAC CATHETERIZATION N/A 07/30/2015   Procedure: Left Heart Cath and Coronary  Angiography;  Surgeon: Burnell Blanks, MD;  Location: [facility];  Service: Cardiovascular;  Laterality: N/A;  . CARDIAC CATHETERIZATION N/A 07/19/2016   Procedure: Left Heart Cath and Coronary Angiography;  Surgeon: Belva Crome, MD;  Location: [facility];  Service: Cardiovascular;  Laterality: N/A;  . CARDIAC CATHETERIZATION N/A 07/29/2016   Procedure: Coronary Stent Intervention;  Surgeon: Belva Crome, MD;  Location: [facility];  Service: Cardiovascular;  Laterality: N/A;  . CARDIAC CATHETERIZATION N/A 07/29/2016   Procedure: Intravascular Ultrasound/IVUS;  Surgeon: Belva Crome, MD;  Location: [facility];  Service: Cardiovascular;  Laterality: N/A;  . CATARACT EXTRACTION W/ INTRAOCULAR LENS  IMPLANT, BILATERAL Bilateral 2014-2015  . CORONARY ANGIOPLASTY WITH STENT PLACEMENT  10/31/2011   30 % MID ATRIOVENTRICULAR GROOVE CX STENOSIS. 90 % MID RCA.PTA TO LAD/STENTING OF TANDEM 60-70%. LAD STENOSIS 99% REDUCED TO 0%.3.0 X 32MM PROMUS DES POSTDILATED TO 3.25MM.  [name] CORONARY ANGIOPLASTY WITH STENT PLACEMENT  07/03/2014   "2"  . I&D EXTREMITY Right 10/30/2013   Procedure: IRRIGATION AND DEBRIDEMENT RIGHT SMALL FINGER POSSIBLE SECONDARY WOUND CLOSURE;  Surgeon: [name], MD;  [facility];  Service: Orthopedics;  Laterality: Right;  [name] is available after 4pm  . I&D EXTREMITY Right 11/01/2013   Procedure:  IRRIGATION AND DEBRIDEMENT RIGHT  PROXIMAL PHALANGEAL LEVEL AMPUTATION;  Surgeon: [name], MD;  [facility];  Service: Orthopedics;  Laterality: Right;  . INCISION AND DRAINAGE Right 10/28/2013   Procedure: INCISION AND DRAINAGE Right small finger;  Surgeon: [name], MD;  [facility];  Service: Orthopedics;  Laterality: Right;  . LEFT HEART CATH Left 11/03/2011   Procedure: LEFT HEART CATH;  Surgeon: [name], MD;  Location: [facility] CATH LAB;  Service: Cardiovascular;  Laterality: Left;  . LEFT HEART  CATHETERIZATION WITH CORONARY ANGIOGRAM N/A 07/03/2014   Procedure: LEFT HEART CATHETERIZATION WITH CORONARY ANGIOGRAM;  Surgeon: [name], MD;  Location: [facility] CATH LAB;  Service: Cardiovascular;  Laterality: N/A;  . PERCUTANEOUS CORONARY STENT INTERVENTION (PCI-S) Left 11/03/2011   Procedure: PERCUTANEOUS CORONARY STENT INTERVENTION (PCI-S);  Surgeon: [name], MD;  Location: [facility] CATH LAB;  Service: Cardiovascular;  Laterality: Left;  . TOE AMPUTATION Right ~ 2010   "2nd toe"  . TOE AMPUTATION Right 2012   "3rd toe"    Allergies  No Known Allergies  History of Present Illness    60 year old male with the above complex past medical history including coronary artery disease status post prior stenting of the RCA and LAD in 2012. Other history includes hypertension, hyperlipidemia, diabetes, GERD by gastroparesis and also mild osteomyelitis status post prior toe amputations on the right in 2010 and 2012. He was recently admitted with chest pain and ruled in for non-STEMI. He underwent diagnostic catheterization revealing severe multivessel native coronary artery disease. He was evaluate by surgery and [name] opted for PCI. His hospital course was complicated by osteomyelitis myelitis involving the right great toe. He was seen by [facility] and underwent right transmetatarsal amputation. He was also seen by infectious disease maintained on IV antibiotics. Following amputation and adequate amount of antibiotic therapy, he underwent PCI of the LAD and second obtuse marginal with drug-eluting stents placed in both locations. He was subsequently discharged to inpatient rehabilitation and later discharged from rehabilitation on August 31.  Since his discharge from rehabilitation, he has done reasonably well. He has been using a walker to get around and says overall, he has been fairly inactive. He is not having any significant chest pain. He does have some dyspnea on exertion, which he  attributes trying to get used to using a walker and also expressing right shoulder discomfort related to an old football injury while using the walker. He has not been having PND, orthopnea, dizziness, syncope, or early satiety. There was some confusion with his medications following his discharge from rehabilitation. He says initially, he resume some of the medications he had been at home previously including Azor and Lasix but then a home health nurse helped him to understand that he wasn't supposed be taking those. He has been compliant with his aspirin and brilinta. His right foot has been healing well.  Home Medications    Prior to Admission medications   Medication Sig Start Date End Date Taking? Authorizing Provider  acetaminophen (TYLENOL) 325 MG tablet Take 2 tablets (650 mg total) by mouth every 4 (four) hours as needed for headache or mild pain. 08/03/16   Isaiah Serge, NP  allopurinol (ZYLOPRIM) 300 MG tablet Take 1 tablet (300 mg total) by mouth daily. 08/13/16   Ivan Anchors Love, PA-C  amLODipine (NORVASC) 10 MG tablet Take 1 tablet (10 mg total) by mouth daily. 08/20/16   [name], MD  aspirin EC 81 MG tablet Take 81 mg by mouth daily.    Historical Provider, MD  doxazosin (CARDURA) 8 MG tablet Take 1 tablet (8 mg total) by mouth daily. 08/13/16   Bary Leriche, PA-C  hydrALAZINE (APRESOLINE) 25 MG tablet Take 1.5 tablets (37.5 mg total) by mouth 2 (two) times daily. 08/13/16   Ivan Anchors Love, PA-C  insulin aspart (NOVOLOG) 100 UNIT/ML injection Inject 0-20 Units into the skin 3 (three) times daily with meals. 08/03/16   Isaiah Serge, NP  insulin NPH Human (HUMULIN N,NOVOLIN N) 100 UNIT/ML injection Use 20 units in morning and 22 units at bedtime. 08/13/16   Bary Leriche, PA-C  isosorbide mononitrate (IMDUR) 30 MG 24 hr tablet Take 3 tablets (90 mg total) by mouth daily. 08/20/16   [name], MD  isosorbide mononitrate (IMDUR) 60 MG 24 hr tablet TAKE 1 TABLET (60 MG TOTAL) BY MOUTH DAILY.  08/27/16   Rogelia Mire, NP  metoprolol succinate (TOPROL-XL) 25 MG 24 hr tablet Take 3 tablets (75 mg total) by mouth daily. 08/20/16   [name], MD  nitroGLYCERIN (NITROSTAT) 0.4 MG SL tablet Place 1 tablet (0.4 mg total) under the tongue every 5 (five) minutes as needed for chest pain. 10/19/13   [name], MD  oxyCODONE (ROXICODONE) 5 MG immediate release tablet Take 1 tablet (5 mg total) by mouth daily as needed for severe pain. 08/26/16   Ankit Lorie Phenix, MD  oxyCODONE-acetaminophen (PERCOCET/ROXICET) 5-325 MG tablet Take 1 tablet by mouth every 8 (eight) hours as needed for severe pain. 08/13/16   Ivan Anchors Love, PA-C  pantoprazole (PROTONIX) 40 MG tablet Take 1 tablet (40 mg total) by mouth daily. 08/13/16   Ivan Anchors Love, PA-C  potassium chloride SA (K-DUR,KLOR-CON) 20 MEQ tablet Take 1 tablet (20 mEq total) by mouth daily. 08/20/16   [name], MD  rosuvastatin (CRESTOR) 40 MG tablet Take 1 tablet (40 mg total) by mouth daily at 6 PM. 08/13/16   Bary Leriche, PA-C  ticagrelor (BRILINTA) 90 MG TABS tablet Take 1 tablet (90 mg total) by mouth 2 (two) times daily. 08/13/16   Bary Leriche, PA-C    Review of Systems    As above, doing well overall.  He does have some dyspnea on exertion, which is stable. He denies chest pain, palpitations, pnd, orthopnea, n, v, dizziness, syncope, edema, weight gain, or early satiety.  All other systems reviewed and are otherwise negative except as noted above.  Physical Exam    VS:  BP 104/67   Pulse 84   Ht $R'5\' 9"'QE$  (1.753 m)  , BMI There is no height or weight on file to calculate BMI. GEN: Well nourished, well developed, in no acute distress.  HEENT: normal.  Neck: Supple, no JVD, carotid bruits, or masses. Cardiac: RRR, no murmurs, rubs, or gallops. No clubbing, cyanosis, edema. DP/PT 1+ on left.  R foot/ankle wrapped.  R wrist catheterization site without bleeding, bruit, or hematoma. Respiratory:  Respirations regular and unlabored, clear  to auscultation bilaterally. GI: Soft, nontender, nondistended, BS + x 4. MS: no deformity or atrophy. Skin: warm and dry, no rash. Neuro:  Strength and sensation are intact. Psych: Normal affect.  Accessory Clinical Findings    ECG - Regular sinus rhythm, 84, left atrial enlargement and no acute ST or T changes.  Assessment & Plan    1.  Non-ST segment elevation myocardial infarction, subsequent episode of care/coronary artery disease: Status post recent admission with chest pain and finding of significant LAD and obtuse marginal disease. After  surgical consultation, patient opted for PCI, which was successfully performed with 2 drug-eluting stents placed. He has done well since discharge, initially going to rehabilitation and then home. He has not been having any significant chest pain. Has chronic dyspnea on exertion and is going to use a walker. He is unsure about cardiac rehabilitation. He has been compliant with aspirin, Brilinta, statin, nitrate, and beta blocker therapy.  2. Hypertensive heart disease: Blood pressure is stable on beta blocker and amlodipine, and hydralazine.  3. Hyperlipidemia: LDL was 83 and March of this year. He is now hypotensive Crestor.  4. Stage III chronic kidney disease: He is now off of ARB and furosemide therapy since his recent hospitalization. DC potassium.  5. Obstructive sleep apnea: He has been noncompliant with CPAP recently. I stressed the importance of compliance.  6. Type 2 diabetes mellitus: He has follow-up with endocrinology within the next 2 weeks.  7. Osteomyelitis of the right foot: Status post transmetatarsal amputation in August. He has been healing well. He is off of antibiotics. Follow-up with Ortho as scheduled. He does have a home health nurse coming out.  8. Morbid obesity: Currently he is not particularly active. That will be a limitation as far as trying to get some weight off of him.  9. Chronic Diastolic CHF:  Volume appears  to be stable.  HR/BP controlled.  Off of lasix since hospitalization.  10.  Disposition: Follow-up with Dr. Claiborne Billings in 3 months or sooner if necessary. We have contacted his pharmacy to square away specifically which medications we would like him to be on and have also made this clear for him.   Murray Hodgkins, NP 08/31/2016, 10:31 AM

## 2016-09-08 ENCOUNTER — Ambulatory Visit (INDEPENDENT_AMBULATORY_CARE_PROVIDER_SITE_OTHER): Payer: BLUE CROSS/BLUE SHIELD | Admitting: Infectious Disease

## 2016-09-08 ENCOUNTER — Encounter: Payer: Self-pay | Admitting: Infectious Disease

## 2016-09-08 VITALS — BP 120/74 | HR 75 | Temp 98.9°F | Ht 69.0 in | Wt 295.0 lb

## 2016-09-08 DIAGNOSIS — Z89431 Acquired absence of right foot: Secondary | ICD-10-CM

## 2016-09-08 DIAGNOSIS — L0390 Cellulitis, unspecified: Secondary | ICD-10-CM

## 2016-09-08 DIAGNOSIS — L0291 Cutaneous abscess, unspecified: Secondary | ICD-10-CM

## 2016-09-08 DIAGNOSIS — M86671 Other chronic osteomyelitis, right ankle and foot: Secondary | ICD-10-CM | POA: Diagnosis not present

## 2016-09-08 DIAGNOSIS — E10621 Type 1 diabetes mellitus with foot ulcer: Secondary | ICD-10-CM | POA: Diagnosis not present

## 2016-09-08 DIAGNOSIS — L97519 Non-pressure chronic ulcer of other part of right foot with unspecified severity: Secondary | ICD-10-CM

## 2016-09-08 LAB — CBC WITH DIFFERENTIAL/PLATELET
Basophils Absolute: 0 cells/uL (ref 0–200)
Basophils Relative: 0 %
Eosinophils Absolute: 66 cells/uL (ref 15–500)
Eosinophils Relative: 1 %
HCT: 35.2 % — ABNORMAL LOW (ref 38.5–50.0)
Hemoglobin: 11.2 g/dL — ABNORMAL LOW (ref 13.2–17.1)
Lymphocytes Relative: 24 %
Lymphs Abs: 1584 cells/uL (ref 850–3900)
MCH: 27 pg (ref 27.0–33.0)
MCHC: 31.8 g/dL — ABNORMAL LOW (ref 32.0–36.0)
MCV: 84.8 fL (ref 80.0–100.0)
MPV: 9.4 fL (ref 7.5–12.5)
Monocytes Absolute: 660 cells/uL (ref 200–950)
Monocytes Relative: 10 %
Neutro Abs: 4290 cells/uL (ref 1500–7800)
Neutrophils Relative %: 65 %
Platelets: 223 10*3/uL (ref 140–400)
RBC: 4.15 MIL/uL — ABNORMAL LOW (ref 4.20–5.80)
RDW: 16.3 % — ABNORMAL HIGH (ref 11.0–15.0)
WBC: 6.6 10*3/uL (ref 3.8–10.8)

## 2016-09-08 LAB — BASIC METABOLIC PANEL WITH GFR
BUN: 38 mg/dL — ABNORMAL HIGH (ref 7–25)
CO2: 17 mmol/L — ABNORMAL LOW (ref 20–31)
Calcium: 9.3 mg/dL (ref 8.6–10.3)
Chloride: 109 mmol/L (ref 98–110)
Creat: 1.89 mg/dL — ABNORMAL HIGH (ref 0.70–1.33)
GFR, Est African American: 44 mL/min — ABNORMAL LOW (ref >=60)
GFR, Est Non African American: 38 mL/min — ABNORMAL LOW (ref >=60)
Glucose, Bld: 164 mg/dL — ABNORMAL HIGH (ref 65–99)
Potassium: 4.3 mmol/L (ref 3.5–5.3)
Sodium: 139 mmol/L (ref 135–146)

## 2016-09-08 NOTE — Progress Notes (Signed)
Subjective:    Patient ID: Joshua [name], male    DOB: December 31, 1955, 60 y.o.   MRN: [id number]  HPI  60 y.o. male with  unstable angina with 3 V disease in need of PCI, with CKD, DM that is poorly controlled and with neuropathy, hx of prior amputations of 2 digits on same foot now with osteomyelitis in distal and possibly proximal phalanx of great toe and foot abscess now sp TMT amputation by Dr. Sharol Given. No intraoperative cultures were obtained so we made do with empiric "mop up" IV antibiotics with 3 weeks of postoperative Teflaro.  He was seen by Dr. Jess Barters PA in followup who was disturbed by opening of the wound and rx doxycycline. Patient is not bearing weight but is having fleeting sharp intense pains that he is requiring narcotics to control.  Past Medical History:  Diagnosis Date  . Chronic combined systolic and diastolic CHF (congestive heart failure) ([place])    a. 07/2015 Echo: EF 45-50%, mod LVH, mid apical/antsept AK, Gr 1 DD.  [name] CKD (chronic kidney disease), stage III   . Coronary artery disease    a. 2012 s/p MI/PCI: s/p DES to mid/dist RCA and overlapping DES to LAD;  b. 07/2015 Cath: LAD 10% ISR, D1 40(jailed), LCX 27m, OM2 30, OM3 30, RCA 78m ISR, 10d ISR; c. 07/2016 NSTEMI/PCI: LAD 85ost/p/m (3.5x20 Synergy DES overlapping prior stent), D1 40, LCX 23m, OM2 95 (2.75x16 Synergy DES), OM3 30, RCA 31m, 10d.  . Diabetic gastroparesis ([place])   . GERD (gastroesophageal reflux disease)   . Gout   . Heart murmur   . Hyperlipemia   . Hypertensive heart disease   . Ischemic cardiomyopathy    a. 07/2015 Echo: EF 45-50%, mod LVH, mid-apicalanteroseptal AK, Gr1 DD.  . Morbid obesity ([place])   . Myocardial infarction ([place]) 2012  . OSA on CPAP   . Osteomyelitis ([place])    a. 07/2016 MTP joint of R great toe s/p transmetatarsal amputation.  . Polyneuropathy in diabetes(357.2)   . Pulmonary sarcoidosis ([place])    "problems w/it years ago; no problems anymore" (07/03/2014)  . Type II diabetes  mellitus ([place])     Past Surgical History:  Procedure Laterality Date  . AMPUTATION Right 07/24/2016   Procedure: TRANSMETATARSAL FOOT AMPUTATION;  Surgeon: [name], MD;  Location: [employer] [facility];  Service: Orthopedics;  Laterality: Right;  . BREAST LUMPECTOMY Right ~ 2000   "benign"  . CARDIAC CATHETERIZATION N/A 07/30/2015   Procedure: Left Heart Cath and Coronary Angiography;  Surgeon: Burnell Blanks, MD;  Location: [facility];  Service: Cardiovascular;  Laterality: N/A;  . CARDIAC CATHETERIZATION N/A 07/19/2016   Procedure: Left Heart Cath and Coronary Angiography;  Surgeon: Belva Crome, MD;  Location: [facility];  Service: Cardiovascular;  Laterality: N/A;  . CARDIAC CATHETERIZATION N/A 07/29/2016   Procedure: Coronary Stent Intervention;  Surgeon: Belva Crome, MD;  Location: [facility];  Service: Cardiovascular;  Laterality: N/A;  . CARDIAC CATHETERIZATION N/A 07/29/2016   Procedure: Intravascular Ultrasound/IVUS;  Surgeon: Belva Crome, MD;  Location: [facility];  Service: Cardiovascular;  Laterality: N/A;  . CATARACT EXTRACTION W/ INTRAOCULAR LENS  IMPLANT, BILATERAL Bilateral 2014-2015  . CORONARY ANGIOPLASTY WITH STENT PLACEMENT  10/31/2011   30 % MID ATRIOVENTRICULAR GROOVE CX STENOSIS. 90 % MID RCA.PTA TO LAD/STENTING OF TANDEM 60-70%. LAD STENOSIS 99% REDUCED TO 0%.3.0 X 32MM PROMUS DES POSTDILATED TO 3.25MM.  [name] CORONARY ANGIOPLASTY WITH  STENT PLACEMENT  07/03/2014   "2"  . I&D EXTREMITY Right 10/30/2013   Procedure: IRRIGATION AND DEBRIDEMENT RIGHT SMALL FINGER POSSIBLE SECONDARY WOUND CLOSURE;  Surgeon: [name], MD;  [facility];  Service: Orthopedics;  Laterality: Right;  [name] is available after 4pm  . I&D EXTREMITY Right 11/01/2013   Procedure:  IRRIGATION AND DEBRIDEMENT RIGHT  PROXIMAL PHALANGEAL LEVEL AMPUTATION;  Surgeon: [name], MD;  [facility];  Service: Orthopedics;  Laterality: Right;  .  INCISION AND DRAINAGE Right 10/28/2013   Procedure: INCISION AND DRAINAGE Right small finger;  Surgeon: [name], MD;  [facility];  Service: Orthopedics;  Laterality: Right;  . LEFT HEART CATH Left 11/03/2011   Procedure: LEFT HEART CATH;  Surgeon: [name], MD;  [facility] LAB;  Service: Cardiovascular;  Laterality: Left;  . LEFT HEART CATHETERIZATION WITH CORONARY ANGIOGRAM N/A 07/03/2014   Procedure: LEFT HEART CATHETERIZATION WITH CORONARY ANGIOGRAM;  Surgeon: [name], MD;  Location: [facility] CATH LAB;  Service: Cardiovascular;  Laterality: N/A;  . PERCUTANEOUS CORONARY STENT INTERVENTION (PCI-S) Left 11/03/2011   Procedure: PERCUTANEOUS CORONARY STENT INTERVENTION (PCI-S);  Surgeon: [name], MD;  Location: [facility] CATH LAB;  Service: Cardiovascular;  Laterality: Left;  . TOE AMPUTATION Right ~ 2010   "2nd toe"  . TOE AMPUTATION Right 2012   "3rd toe"    Family History  Problem Relation Age of Onset  . Heart attack Maternal Aunt   . Diabetes Maternal Grandmother   . Hypertension Maternal Grandmother       Social History   Social History  . Marital status: Married    Spouse name: N/A  . Number of children: N/A  . Years of education: N/A   Social History Main Topics  . Smoking status: Never Smoker  . Smokeless tobacco: Never Used  . Alcohol use Yes     Comment: Pt sts he binge drinks on the weekends  . Drug use: No  . Sexual activity: Yes   Other Topics Concern  . None   Social History Narrative  . None    No Known Allergies   Current Outpatient Prescriptions:  .  acetaminophen (TYLENOL) 325 MG tablet, Take 2 tablets (650 mg total) by mouth every 4 (four) hours as needed for headache or mild pain., Disp: , Rfl:  .  allopurinol (ZYLOPRIM) 300 MG tablet, Take 1 tablet (300 mg total) by mouth daily., Disp: 30 tablet, Rfl: 0 .  amLODipine (NORVASC) 10 MG tablet, Take 1 tablet (10 mg total) by mouth daily., Disp: 30 tablet, Rfl: 0 .   aspirin EC 81 MG tablet, Take 81 mg by mouth daily., Disp: , Rfl:  .  doxazosin (CARDURA) 8 MG tablet, Take 1 tablet (8 mg total) by mouth daily., Disp: 30 tablet, Rfl: 0 .  doxycycline (VIBRA-TABS) 100 MG tablet, Take 100 mg by mouth 2 (two) times daily., Disp: , Rfl: 0 .  hydrALAZINE (APRESOLINE) 25 MG tablet, Take 1.5 tablets (37.5 mg total) by mouth 2 (two) times daily., Disp: 90 tablet, Rfl: 0 .  insulin NPH Human (HUMULIN N,NOVOLIN N) 100 UNIT/ML injection, Use 20 units in morning and 22 units at bedtime. (Patient taking differently: Use 20 units in morning and 15 units at bedtime.), Disp: 10 mL, Rfl: 11 .  isosorbide mononitrate (IMDUR) 30 MG 24 hr tablet, Take 3 tablets (90 mg total) by mouth daily., Disp: 90 tablet, Rfl: 0 .  metoprolol succinate (TOPROL-XL) 25  MG 24 hr tablet, Take 3 tablets (75 mg total) by mouth daily., Disp: 90 tablet, Rfl: 0 .  mupirocin ointment (BACTROBAN) 2 %, APPLY TO AFFECTED AREA EVERY DAY, Disp: , Rfl: 1 .  nitroGLYCERIN (NITROSTAT) 0.4 MG SL tablet, Place 1 tablet (0.4 mg total) under the tongue every 5 (five) minutes as needed for chest pain., Disp: 25 tablet, Rfl: 3 .  NOVOLIN R 100 UNIT/ML injection, Sliding scale as directed 70-150 = 3 units 151-200 =5 units 201-250 =7 units 251-300 =9 units >300 = 12 units, Disp: , Rfl: 5 .  oxyCODONE (ROXICODONE) 5 MG immediate release tablet, Take 1 tablet (5 mg total) by mouth daily as needed for severe pain., Disp: 10 tablet, Rfl: 0 .  pantoprazole (PROTONIX) 40 MG tablet, Take 1 tablet (40 mg total) by mouth daily., Disp: 30 tablet, Rfl: 0 .  rosuvastatin (CRESTOR) 40 MG tablet, Take 1 tablet (40 mg total) by mouth daily at 6 PM., Disp: 30 tablet, Rfl: 0 .  ticagrelor (BRILINTA) 90 MG TABS tablet, Take 1 tablet (90 mg total) by mouth 2 (two) times daily., Disp: 60 tablet, Rfl: 0   Review of Systems  Constitutional: Negative for chills, diaphoresis and fever.  HENT: Negative for congestion, hearing loss, sore throat  and tinnitus.   Respiratory: Negative for cough, shortness of breath and wheezing.   Cardiovascular: Negative for chest pain, palpitations and leg swelling.  Gastrointestinal: Negative for abdominal pain, blood in stool, constipation, diarrhea, nausea and vomiting.  Genitourinary: Negative for dysuria, flank pain and hematuria.  Musculoskeletal: Positive for myalgias. Negative for back pain.  Skin: Positive for wound. Negative for rash.  Neurological: Negative for dizziness, weakness and headaches.  Hematological: Does not bruise/bleed easily.  Psychiatric/Behavioral: Negative for suicidal ideas. The patient is not nervous/anxious.        Objective:   Physical Exam  Constitutional: He is oriented to person, place, and time. He appears well-developed and well-nourished.  HENT:  Head: Normocephalic and atraumatic.  Eyes: Conjunctivae and EOM are normal.  Neck: Normal range of motion. Neck supple.  Cardiovascular: Normal rate and regular rhythm.   Pulmonary/Chest: Effort normal. No respiratory distress. He has no wheezes.  Abdominal: Soft. He exhibits no distension.  Musculoskeletal: Normal range of motion. He exhibits no edema or tenderness.  Neurological: He is alert and oriented to person, place, and time.  Skin: Skin is warm and dry. No rash noted. No erythema.  Psychiatric: He has a normal mood and affect. His behavior is normal. Judgment and thought content normal.    Right foot amputation site wound 09/08/16:     Opposite foot without ulcers      Assessment & Plan:   Amputation site infection: currently on doxycycline which will cover MRSA, coag neg staph but not strep at all  Will continue this for now, follow clinically with low threshold for repeat MRI  IF repeat surgery undertaken would DC his doxy and OBTAIN cultures in the OR with any debridement  Or further amputation of tissue, bone  I spent greater than 25 minutes with the patient including greater than 50%  of time in face to face counsel of the patient re his recent osteomyeltis and now amputation site infection and in coordination of his care.

## 2016-09-09 ENCOUNTER — Telehealth: Payer: Self-pay | Admitting: *Deleted

## 2016-09-09 LAB — SEDIMENTATION RATE: Sed Rate: 44 mm/h — ABNORMAL HIGH (ref 0–20)

## 2016-09-09 LAB — C-REACTIVE PROTEIN: CRP: 2 mg/L

## 2016-09-09 NOTE — Telephone Encounter (Signed)
Thanks so much. 

## 2016-09-09 NOTE — Telephone Encounter (Signed)
Left message notifying patient that Dr. Tommy Medal wants him to follow up with his PCP regarding bloodwork results. RN contacted Dr. Noland Fordyce office, spoke with his nurse to relay the message as well. Faxed last office note and lab results to Dr. Alyson Ingles via [facility]. Landis Gandy, RN

## 2016-09-09 NOTE — Telephone Encounter (Signed)
-----   Message from Truman Hayward, MD sent at 09/09/2016 11:22 AM EDT ----- pts bicarbonate is suddenly low to 17. He should see PCP for repeat blood work and workup if this is true acidemia

## 2016-09-15 ENCOUNTER — Ambulatory Visit (INDEPENDENT_AMBULATORY_CARE_PROVIDER_SITE_OTHER): Payer: BLUE CROSS/BLUE SHIELD | Admitting: Orthopedic Surgery

## 2016-09-15 DIAGNOSIS — E1142 Type 2 diabetes mellitus with diabetic polyneuropathy: Secondary | ICD-10-CM

## 2016-09-15 DIAGNOSIS — Z89431 Acquired absence of right foot: Secondary | ICD-10-CM | POA: Diagnosis not present

## 2016-09-20 ENCOUNTER — Telehealth: Payer: Self-pay | Admitting: Physical Medicine & Rehabilitation

## 2016-09-20 NOTE — Telephone Encounter (Signed)
FYI

## 2016-09-20 NOTE — Telephone Encounter (Signed)
Autumn RN with [facility] needs to let us know of missed visit on Friday with patient.  Left messages to call back, no return phone call.  They also tried Saturday, with no success.  May need to discharge patient due to homebound status.  Please call Autumn at [phone].

## 2016-09-23 ENCOUNTER — Telehealth: Payer: Self-pay | Admitting: Physical Medicine & Rehabilitation

## 2016-09-23 NOTE — Telephone Encounter (Signed)
Joshua Volgar Rn with [facility] has tried to contact patient with messages since 09/10/16 with no return call back.  She would like to discharge patient.  Please call her at [phone].

## 2016-09-27 ENCOUNTER — Other Ambulatory Visit: Payer: Self-pay | Admitting: Cardiovascular Disease

## 2016-10-04 ENCOUNTER — Other Ambulatory Visit: Payer: Self-pay | Admitting: Cardiovascular Disease

## 2016-10-04 NOTE — Telephone Encounter (Signed)
Rx has been sent to the pharmacy electronically. ° °

## 2016-10-06 ENCOUNTER — Encounter: Payer: Self-pay | Admitting: Infectious Disease

## 2016-10-11 ENCOUNTER — Telehealth: Payer: Self-pay | Admitting: Cardiovascular Disease

## 2016-10-11 ENCOUNTER — Observation Stay (HOSPITAL_COMMUNITY)
Admission: EM | Admit: 2016-10-11 | Discharge: 2016-10-13 | Disposition: A | Discharge status: Home / Self Care | Payer: BLUE CROSS/BLUE SHIELD | Attending: Cardiovascular Disease | Admitting: Cardiovascular Disease

## 2016-10-11 ENCOUNTER — Emergency Department (HOSPITAL_COMMUNITY): Payer: BLUE CROSS/BLUE SHIELD

## 2016-10-11 ENCOUNTER — Ambulatory Visit (INDEPENDENT_AMBULATORY_CARE_PROVIDER_SITE_OTHER): Payer: BLUE CROSS/BLUE SHIELD | Admitting: Family

## 2016-10-11 ENCOUNTER — Encounter (INDEPENDENT_AMBULATORY_CARE_PROVIDER_SITE_OTHER): Payer: Self-pay | Admitting: Orthopedic Surgery

## 2016-10-11 ENCOUNTER — Encounter (HOSPITAL_COMMUNITY): Payer: Self-pay

## 2016-10-11 VITALS — Ht 69.0 in | Wt 290.0 lb

## 2016-10-11 DIAGNOSIS — Z7982 Long term (current) use of aspirin: Secondary | ICD-10-CM | POA: Insufficient documentation

## 2016-10-11 DIAGNOSIS — I2510 Atherosclerotic heart disease of native coronary artery without angina pectoris: Secondary | ICD-10-CM | POA: Diagnosis not present

## 2016-10-11 DIAGNOSIS — E10621 Type 1 diabetes mellitus with foot ulcer: Secondary | ICD-10-CM

## 2016-10-11 DIAGNOSIS — Z9861 Coronary angioplasty status: Secondary | ICD-10-CM

## 2016-10-11 DIAGNOSIS — Z79899 Other long term (current) drug therapy: Secondary | ICD-10-CM | POA: Insufficient documentation

## 2016-10-11 DIAGNOSIS — E7800 Pure hypercholesterolemia, unspecified: Secondary | ICD-10-CM | POA: Insufficient documentation

## 2016-10-11 DIAGNOSIS — I130 Hypertensive heart and chronic kidney disease with heart failure and stage 1 through stage 4 chronic kidney disease, or unspecified chronic kidney disease: Secondary | ICD-10-CM | POA: Insufficient documentation

## 2016-10-11 DIAGNOSIS — R0789 Other chest pain: Principal | ICD-10-CM | POA: Insufficient documentation

## 2016-10-11 DIAGNOSIS — R079 Chest pain, unspecified: Secondary | ICD-10-CM | POA: Diagnosis not present

## 2016-10-11 DIAGNOSIS — G4730 Sleep apnea, unspecified: Secondary | ICD-10-CM | POA: Diagnosis present

## 2016-10-11 DIAGNOSIS — E785 Hyperlipidemia, unspecified: Secondary | ICD-10-CM | POA: Diagnosis present

## 2016-10-11 DIAGNOSIS — M86671 Other chronic osteomyelitis, right ankle and foot: Secondary | ICD-10-CM | POA: Diagnosis present

## 2016-10-11 DIAGNOSIS — L97519 Non-pressure chronic ulcer of other part of right foot with unspecified severity: Secondary | ICD-10-CM

## 2016-10-11 DIAGNOSIS — N183 Chronic kidney disease, stage 3 unspecified: Secondary | ICD-10-CM | POA: Diagnosis present

## 2016-10-11 DIAGNOSIS — I5042 Chronic combined systolic (congestive) and diastolic (congestive) heart failure: Secondary | ICD-10-CM | POA: Diagnosis not present

## 2016-10-11 DIAGNOSIS — N1832 Chronic kidney disease, stage 3b: Secondary | ICD-10-CM | POA: Diagnosis present

## 2016-10-11 DIAGNOSIS — E119 Type 2 diabetes mellitus without complications: Secondary | ICD-10-CM | POA: Diagnosis present

## 2016-10-11 DIAGNOSIS — I10 Essential (primary) hypertension: Secondary | ICD-10-CM

## 2016-10-11 DIAGNOSIS — E1122 Type 2 diabetes mellitus with diabetic chronic kidney disease: Secondary | ICD-10-CM | POA: Insufficient documentation

## 2016-10-11 DIAGNOSIS — Z6841 Body Mass Index (BMI) 40.0 and over, adult: Secondary | ICD-10-CM | POA: Diagnosis present

## 2016-10-11 DIAGNOSIS — I252 Old myocardial infarction: Secondary | ICD-10-CM | POA: Insufficient documentation

## 2016-10-11 DIAGNOSIS — Z89431 Acquired absence of right foot: Secondary | ICD-10-CM

## 2016-10-11 DIAGNOSIS — E66813 Obesity, class 3: Secondary | ICD-10-CM | POA: Diagnosis present

## 2016-10-11 DIAGNOSIS — E1142 Type 2 diabetes mellitus with diabetic polyneuropathy: Secondary | ICD-10-CM | POA: Diagnosis not present

## 2016-10-11 HISTORY — DX: Depression, unspecified: F32.A

## 2016-10-11 HISTORY — DX: Major depressive disorder, single episode, unspecified: F32.9

## 2016-10-11 LAB — BASIC METABOLIC PANEL
Anion gap: 7 (ref 5–15)
BUN: 17 mg/dL (ref 6–20)
CO2: 23 mmol/L (ref 22–32)
Calcium: 8.8 mg/dL — ABNORMAL LOW (ref 8.9–10.3)
Chloride: 111 mmol/L (ref 101–111)
Creatinine, Ser: 1.54 mg/dL — ABNORMAL HIGH (ref 0.61–1.24)
GFR calc Af Amer: 56 mL/min — ABNORMAL LOW (ref >60)
GFR calc non Af Amer: 48 mL/min — ABNORMAL LOW (ref >60)
Glucose, Bld: 116 mg/dL — ABNORMAL HIGH (ref 65–99)
Potassium: 3.5 mmol/L (ref 3.5–5.1)
Sodium: 141 mmol/L (ref 135–145)

## 2016-10-11 LAB — TROPONIN I
Troponin I: <0.03 ng/mL (ref <0.03)
Troponin I: <0.03 ng/mL (ref <0.03)

## 2016-10-11 LAB — CBC
HCT: 36.1 % — ABNORMAL LOW (ref 39.0–52.0)
HCT: 33.5 % — ABNORMAL LOW (ref 39.0–52.0)
Hemoglobin: 11.3 g/dL — ABNORMAL LOW (ref 13.0–17.0)
Hemoglobin: 10.6 g/dL — ABNORMAL LOW (ref 13.0–17.0)
MCH: 26.5 pg (ref 26.0–34.0)
MCH: 26.6 pg (ref 26.0–34.0)
MCHC: 31.3 g/dL (ref 30.0–36.0)
MCHC: 31.6 g/dL (ref 30.0–36.0)
MCV: 84.5 fL (ref 78.0–100.0)
MCV: 84 fL (ref 78.0–100.0)
Platelets: 246 10*3/uL (ref 150–400)
Platelets: 218 10*3/uL (ref 150–400)
RBC: 4.27 MIL/uL (ref 4.22–5.81)
RBC: 3.99 MIL/uL — ABNORMAL LOW (ref 4.22–5.81)
RDW: 16 % — ABNORMAL HIGH (ref 11.5–15.5)
RDW: 15.8 % — ABNORMAL HIGH (ref 11.5–15.5)
WBC: 7.8 10*3/uL (ref 4.0–10.5)
WBC: 6 10*3/uL (ref 4.0–10.5)

## 2016-10-11 LAB — I-STAT TROPONIN, ED
Troponin i, poc: 0 ng/mL (ref 0.00–0.08)
Troponin i, poc: 0 ng/mL (ref 0.00–0.08)

## 2016-10-11 LAB — CREATININE, SERUM
Creatinine, Ser: 1.42 mg/dL — ABNORMAL HIGH (ref 0.61–1.24)
GFR calc Af Amer: >60 mL/min (ref >60)
GFR calc non Af Amer: 53 mL/min — ABNORMAL LOW (ref >60)

## 2016-10-11 LAB — GLUCOSE, CAPILLARY
Glucose-Capillary: 100 mg/dL — ABNORMAL HIGH (ref 65–99)
Glucose-Capillary: 161 mg/dL — ABNORMAL HIGH (ref 65–99)

## 2016-10-11 LAB — BRAIN NATRIURETIC PEPTIDE: B Natriuretic Peptide: 224.5 pg/mL — ABNORMAL HIGH (ref 0.0–100.0)

## 2016-10-11 MED ORDER — INSULIN ASPART 100 UNIT/ML ~~LOC~~ SOLN: 0.0000 [IU] | Freq: Every day | SUBCUTANEOUS | Status: DC

## 2016-10-11 MED ORDER — KETOROLAC TROMETHAMINE 30 MG/ML IJ SOLN
30.0000 mg | Freq: Three times a day (TID) | INTRAMUSCULAR | Status: AC
  Administered 2016-10-11 – 2016-10-12 (×2): 30 mg via INTRAVENOUS
  Filled 2016-10-11 (×2): qty 1

## 2016-10-11 MED ORDER — ASPIRIN EC 81 MG PO TBEC
81.0000 mg | DELAYED_RELEASE_TABLET | Freq: Every day | ORAL | Status: DC
  Administered 2016-10-12 – 2016-10-13 (×2): 81 mg via ORAL
  Filled 2016-10-11 (×2): qty 1

## 2016-10-11 MED ORDER — ZOLPIDEM TARTRATE 5 MG PO TABS: 5.0000 mg | ORAL_TABLET | Freq: Every evening | ORAL | Status: DC | PRN

## 2016-10-11 MED ORDER — SODIUM CHLORIDE 0.9% FLUSH: 3.0000 mL | INTRAVENOUS | Status: DC | PRN

## 2016-10-11 MED ORDER — ALPRAZOLAM 0.25 MG PO TABS: 0.2500 mg | ORAL_TABLET | Freq: Two times a day (BID) | ORAL | Status: DC | PRN

## 2016-10-11 MED ORDER — NITROGLYCERIN 0.4 MG SL SUBL
0.4000 mg | SUBLINGUAL_TABLET | SUBLINGUAL | Status: DC | PRN
  Administered 2016-10-11: 0.4 mg via SUBLINGUAL
  Filled 2016-10-11: qty 1

## 2016-10-11 MED ORDER — INSULIN ASPART 100 UNIT/ML ~~LOC~~ SOLN
0.0000 [IU] | Freq: Three times a day (TID) | SUBCUTANEOUS | Status: DC
  Administered 2016-10-12 (×2): 3 [IU] via SUBCUTANEOUS
  Administered 2016-10-13: 2 [IU] via SUBCUTANEOUS
  Administered 2016-10-13 (×2): 3 [IU] via SUBCUTANEOUS

## 2016-10-11 MED ORDER — ENOXAPARIN SODIUM 40 MG/0.4ML ~~LOC~~ SOLN
40.0000 mg | SUBCUTANEOUS | Status: DC
  Administered 2016-10-11: 40 mg via SUBCUTANEOUS
  Filled 2016-10-11: qty 0.4

## 2016-10-11 MED ORDER — SODIUM CHLORIDE 0.9 % IV SOLN: 250.0000 mL | INTRAVENOUS | Status: DC | PRN

## 2016-10-11 MED ORDER — ONDANSETRON HCL 4 MG/2ML IJ SOLN: 4.0000 mg | Freq: Four times a day (QID) | INTRAMUSCULAR | Status: DC | PRN

## 2016-10-11 MED ORDER — SODIUM CHLORIDE 0.9% FLUSH
3.0000 mL | Freq: Two times a day (BID) | INTRAVENOUS | Status: DC
  Administered 2016-10-11 – 2016-10-12 (×2): 3 mL via INTRAVENOUS

## 2016-10-11 MED ORDER — TRAMADOL HCL 50 MG PO TABS: 50.0000 mg | ORAL_TABLET | Freq: Four times a day (QID) | ORAL | Status: DC | PRN

## 2016-10-11 MED ORDER — ACETAMINOPHEN 325 MG PO TABS: 650.0000 mg | ORAL_TABLET | ORAL | Status: DC | PRN

## 2016-10-11 NOTE — ED Triage Notes (Signed)
Per pT, Pt is coming from home with complaints of left sided chest pain with some tenderness to the touch. Pt reports intensity and frequency increasing over the last week. Denies N/V/D. Complains of slight SOB and some back pain that took place a couple days ago. Hx of MI

## 2016-10-11 NOTE — Telephone Encounter (Signed)
Received incoming call from patient. He was actively experiencing chest pain. It started a week ago having a couple times a day, not lasting. This morning when he got up the pain is sharp and coming more frequently about every 15 mins. While on the phone he said he was having the pain. When asked if he has SOB, he said it felt like "I'm not taking in enough air." Advised patient to call 911 or have someone drive him directly to the [facility] hospital ED for evaluation. He verbalized understanding and will go to the ED.

## 2016-10-11 NOTE — Telephone Encounter (Signed)
Pt states he had a stroke in Aug but recently today he is having chest pain, sharp pains coming every 15 mins or so. He feels like his condition is getting worse.

## 2016-10-11 NOTE — H&P (Signed)
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 Other history includes hypertension, hyperlipidemia, diabetes with neuropathy, CRI, GERD with gastroparesis and also mild osteomyelitis status post prior toe amputations on the right in 2010 and 2012.   He saw Ignacia Bayley in the office for follow up 08/31/16 and was doing well. He presents to the ED now with complaints of localized Lt sided chest pain for two weeks. His symptoms have been intermittent, not exertional. He feels like his symptoms have accelerated the last two days. He has not used NTG "I've thought about it". He denies any associated dyspnea or chest pressure (his predominant pre PCI symptoms). In the ED he admits to some localized symptoms and chest tenderness.    Problem List: Past Medical History:  Diagnosis Date  . Chronic combined systolic and diastolic CHF (congestive heart failure) ([place])    a. 07/2015 Echo: EF 45-50%, mod LVH, mid apical/antsept AK, Gr 1 DD.  [name] CKD (chronic kidney disease), stage III   . Coronary artery disease    a. 2012 s/p MI/PCI: s/p DES to mid/dist RCA and overlapping DES to LAD;  b. 07/2015 Cath: LAD 10% ISR, D1 40(jailed), LCX 1m, OM2 30, OM3 30, RCA 41m ISR, 10d ISR; c. 07/2016 NSTEMI/PCI: LAD 85ost/p/m (3.5x20 Synergy DES overlapping prior stent), D1 40, LCX 107m, OM2 95 (2.75x16 Synergy DES), OM3 30, RCA 38m, 10d.  . Diabetic gastroparesis ([place])   . GERD (gastroesophageal reflux disease)   . Gout   . Heart murmur   . Hyperlipemia   . Hypertensive heart disease   . Ischemic cardiomyopathy    a. 07/2015 Echo: EF  45-50%, mod LVH, mid-apicalanteroseptal AK, Gr1 DD.  . Morbid obesity ([place])   . Myocardial infarction 2012  . OSA on CPAP   . Osteomyelitis ([place])    a. 07/2016 MTP joint of R great toe s/p transmetatarsal amputation.  . Polyneuropathy in diabetes(357.2)   . Pulmonary sarcoidosis ([place])    "problems w/it years ago; no problems anymore" (07/03/2014)  . Type II diabetes mellitus ([place])     Past Surgical History:  Procedure Laterality Date  . AMPUTATION Right 07/24/2016   Procedure: TRANSMETATARSAL FOOT AMPUTATION;  Surgeon: [name], MD;  Location: [place];  Service: Orthopedics;  Laterality: Right;  . BREAST LUMPECTOMY Right ~ 2000   "benign"  . CARDIAC CATHETERIZATION N/A 07/30/2015   Procedure: Left Heart Cath and Coronary Angiography;  Surgeon: Burnell Blanks, MD;  Location: [place] [facility];  Service: Cardiovascular;  Laterality: N/A;  . CARDIAC CATHETERIZATION N/A 07/19/2016   Procedure: Left Heart Cath and Coronary Angiography;  Surgeon: Belva Crome, MD;  Location: [place] [facility];  Service: Cardiovascular;  Laterality: N/A;  . CARDIAC CATHETERIZATION N/A 07/29/2016   Procedure: Coronary Stent Intervention;  Surgeon: Belva Crome, MD;  Location: [place] [facility];  Service: Cardiovascular;  Laterality: N/A;  . CARDIAC CATHETERIZATION N/A 07/29/2016   Procedure: Intravascular Ultrasound/IVUS;  Surgeon: Belva Crome, MD;  Location: [place] [facility];  Service: Cardiovascular;  Laterality: N/A;  .  CATARACT EXTRACTION W/ INTRAOCULAR LENS  IMPLANT, BILATERAL Bilateral 2014-2015  . CORONARY ANGIOPLASTY WITH STENT PLACEMENT  10/31/2011   30 % MID ATRIOVENTRICULAR GROOVE CX STENOSIS. 90 % MID RCA.PTA TO LAD/STENTING OF TANDEM 60-70%. LAD STENOSIS 99% REDUCED TO 0%.3.0 X PROMUS DES POSTDILATED TO 3.25MM.  [name] CORONARY ANGIOPLASTY WITH STENT PLACEMENT  07/03/2014   "2"  . I&D EXTREMITY Right 10/30/2013   Procedure: IRRIGATION AND DEBRIDEMENT RIGHT SMALL FINGER POSSIBLE  SECONDARY WOUND CLOSURE;  Surgeon: Marlowe Shores, MD;  Location: [facility];  Service: Orthopedics;  Laterality: Right;  Mina Marble is available after 4pm  . I&D EXTREMITY Right 11/01/2013   Procedure:  IRRIGATION AND DEBRIDEMENT RIGHT  PROXIMAL PHALANGEAL LEVEL AMPUTATION;  Surgeon: Marlowe Shores, MD;  Location: [facility];  Service: Orthopedics;  Laterality: Right;  . INCISION AND DRAINAGE Right 10/28/2013   Procedure: INCISION AND DRAINAGE Right small finger;  Surgeon: Marlowe Shores, MD;  Location: [facility];  Service: Orthopedics;  Laterality: Right;  . LEFT HEART CATH Left 11/03/2011   Procedure: LEFT HEART CATH;  Surgeon: Lennette Bihari, MD;  Location: [facility] C[facility] LAB;  Service: Cardiovascular;  Laterality: Left;  . LEFT HEART CATHETERIZATION WITH CORONARY ANGIOGRAM N/A 07/03/2014   Procedure: LEFT HEART CATHETERIZATION WITH CORONARY ANGIOGRAM;  Surgeon: Lesleigh Noe, MD;  Location[facility];  Service: Cardiovascular;  Laterality: N/A;  . PERCUTANEOUS CORONARY STENT INTERVENTION (PCI-S) Left 11/03/2011   Procedure: PERCUTANEOUS CORONARY STENT INTERVENTION (PCI-S);  Surgeon: Lennette Bihari, MD;  Location: [facility] CATH LAB;  Service: Cardiovascular;  Laterality: Left;  . TOE AMPUTATION Right ~ 2010   "2nd toe"  . TOE AMPUTATION Right 2012   "3rd toe"     Allergies: No Known Allergies   Home Medications Prior to Admission medications   Medication Sig Start Date End Date Taking? Authorizing Provider  acetaminophen (TYLENOL) 325 MG tablet Take 2 tablets (650 mg total) by mouth every 4 (four) hours as needed for headache or mild pain. 08/03/16  Yes Leone Brand, NP  allopurinol (ZYLOPRIM) 300 MG tablet Take 1 tablet (300 mg total) by mouth daily. 08/13/16  Yes Evlyn Kanner Love, PA-C  amLODipine (NORVASC) 10 MG tablet Take 1 tablet (10 mg total) by mouth daily. 08/20/16  Yes Lennette Bihari, MD  aspirin EC 81 MG tablet Take 81 mg by mouth daily.   Yes Historical Provider, MD  hydrALAZINE  (APRESOLINE) 25 MG tablet Take 1.5 tablets (37.5 mg total) by mouth 2 (two) times daily. 08/13/16  Yes Evlyn Kanner Love, PA-C  insulin NPH Human (HUMULIN N,NOVOLIN N) 100 UNIT/ML injection Use 20 units in morning and 22 units at bedtime. Patient taking differently: Inject 20-25 Units into the skin 2 (two) times daily. Use 25 units in morning and 20 units at bedtime. 08/13/16  Yes Evlyn Kanner Love, PA-C  isosorbide mononitrate (IMDUR) 30 MG 24 hr tablet TAKE 3 TABLETS (90 MG TOTAL) BY MOUTH DAILY. 10/04/16  Yes Lennette Bihari, MD  metoprolol succinate (TOPROL-XL) 25 MG 24 hr tablet TAKE 3 TABLETS (75 MG TOTAL) BY MOUTH DAILY. 09/28/16  Yes Lennette Bihari, MD  mupirocin ointment (BACTROBAN) 2 % APPLY TO AFFECTED AREA EVERY DAY 09/02/16  Yes Historical Provider, MD  nitroGLYCERIN (NITROSTAT) 0.4 MG SL tablet Place 1 tablet (0.4 mg total) under the tongue every 5 (five) minutes as needed for chest pain. 10/19/13  Yes Lennette Bihari, MD  pantoprazole (PROTONIX) 40 MG tablet Take 1 tablet (40  mg total) by mouth daily. 08/13/16  Yes Ivan Anchors Love, PA-C  rosuvastatin (CRESTOR) 40 MG tablet Take 1 tablet (40 mg total) by mouth daily at 6 PM. 08/13/16  Yes Ivan Anchors Love, PA-C  ticagrelor (BRILINTA) 90 MG TABS tablet Take 1 tablet (90 mg total) by mouth 2 (two) times daily. 08/13/16  Yes Ivan Anchors Love, PA-C  doxazosin (CARDURA) 8 MG tablet Take 1 tablet (8 mg total) by mouth daily. Patient not taking: Reported on 10/11/2016 08/13/16   Ivan Anchors Love, PA-C  doxycycline (VIBRA-TABS) 100 MG tablet Take 100 mg by mouth 2 (two) times daily. 09/02/16   Historical Provider, MD  NOVOLIN R 100 UNIT/ML injection Sliding scale as directed 70-150 = 3 units 151-200 =5 units 201-250 =7 units 251-300 =9 units >300 = 12 units 09/06/16   Historical Provider, MD  oxyCODONE (ROXICODONE) 5 MG immediate release tablet Take 1 tablet (5 mg total) by mouth daily as needed for severe pain. Patient not taking: Reported on 10/11/2016 08/26/16   Ankit Lorie Phenix, MD     Family History  Problem Relation Age of Onset  . Heart attack Maternal Aunt   . Diabetes Maternal Grandmother   . Hypertension Maternal Grandmother      Social History   Social History  . Marital status: Married    Spouse name: N/A  . Number of children: N/A  . Years of education: N/A   Occupational History  . Not on file.   Social History Main Topics  . Smoking status: Never Smoker  . Smokeless tobacco: Never Used  . Alcohol use Yes     Comment: Pt sts he binge drinks on the weekends  . Drug use: No  . Sexual activity: Yes   Other Topics Concern  . Not on file   Social History Narrative  . No narrative on file     Review of Systems: General: negative for chills, fever, night sweats or weight changes.  Cardiovascular: negative for dyspnea on exertion, edema, orthopnea, palpitations, paroxysmal nocturnal dyspnea or shortness of breath HEENT: negative for any visual disturbances, blindness, glaucoma Dermatological: negative for rash Respiratory: negative for cough, hemoptysis, or wheezing Urologic: negative for hematuria or dysuria Abdominal: negative for nausea, vomiting, diarrhea, bright red blood per rectum, melena, or hematemesis Neurologic: negative for visual changes, syncope, or dizziness Musculoskeletal: negative for back pain, joint pain, or swelling Psych: cooperative and appropriate All other systems reviewed and are otherwise negative except as noted above.  Physical Exam: Blood pressure 128/86, pulse 67, temperature 98.1 F (36.7 C), temperature source Oral, resp. rate 21, height $RemoveBe'5\' 9"'[id number]$  (1.753 m), weight 290 lb (131.5 kg), SpO2 98 %.  General appearance: alert, cooperative, no distress and morbidly obese Neck: no carotid bruit and no JVD Lungs: clear to auscultation bilaterally Heart: regular rate and rhythm Abdomen: obese, non tender Extremities: Rt MT amputation Pulses: 2+ and symmetric Skin: Skin color, texture, turgor normal. No  rashes or lesions Neurologic: Grossly normal    Labs:   Results for orders placed or performed during the hospital encounter of 10/11/16 (from the past 24 hour(s))  Basic metabolic panel     Status: Abnormal   Collection Time: 10/11/16 12:32 PM  Result Value Ref Range   Sodium 141 135 - 145 mmol/L   Potassium 3.5 3.5 - 5.1 mmol/L   Chloride 111 101 - 111 mmol/L   CO2 23 22 - 32 mmol/L   Glucose, Bld 116 (H) 65 - 99 mg/dL  BUN 17 6 - 20 mg/dL   Creatinine, Ser 1.54 (H) 0.61 - 1.24 mg/dL   Calcium 8.8 (L) 8.9 - 10.3 mg/dL   GFR calc non Af Amer 48 (L) >60 mL/min   GFR calc Af Amer 56 (L) >60 mL/min   Anion gap 7 5 - 15  CBC     Status: Abnormal   Collection Time: 10/11/16 12:32 PM  Result Value Ref Range   WBC 7.8 4.0 - 10.5 K/uL   RBC 4.27 4.22 - 5.81 MIL/uL   Hemoglobin 11.3 (L) 13.0 - 17.0 g/dL   HCT 36.1 (L) 39.0 - 52.0 %   MCV 84.5 78.0 - 100.0 fL   MCH 26.5 26.0 - 34.0 pg   MCHC 31.3 30.0 - 36.0 g/dL   RDW 16.0 (H) 11.5 - 15.5 %   Platelets 246 150 - 400 K/uL  I-stat troponin, ED     Status: None   Collection Time: 10/11/16 12:50 PM  Result Value Ref Range   Troponin i, poc 0.00 0.00 - 0.08 ng/mL   Comment 3             Radiology/Studies: Dg Chest 2 View  Result Date: 10/11/2016 CLINICAL DATA:  Left-sided chest pain over the last 2 weeks, worsening today. EXAM: CHEST  2 VIEW COMPARISON:  07/18/2016 FINDINGS: The heart is enlarged. There is aortic atherosclerosis. There is chronic interstitial lung disease. Some of the interstitial markings appear more prominent and are suspicious for interstitial edema superimposed on chronic lung disease. No consolidation, collapse or effusion. No acute bone finding. IMPRESSION: Background pattern of cardiomegaly, atherosclerosis and chronic interstitial lung disease. Suspicion of superimposed mild acute interstitial edema. Electronically Signed   By: Nelson Chimes M.D.   On: 10/11/2016 13:10    EKG:NSR, poor anterior RW, septal  Qs  ASSESSMENT AND PLAN:  Principal Problem:   Chest pain with moderate risk of acute coronary syndrome Active Problems:   CAD S/P multiple PCIs   CKD stage 3, GFR 30-59 ml/min   Chronic osteomyelitis of right foot ([place])   DM type 2 with diabetic peripheral neuropathy (HCC)   Hyperlipidemia   Benign essential HTN   Morbid obesity_ BMI 42   Sleep apnea- on C-pap-non compliant   PLAN: C/P is a little atypical but multiple risk factors including diabetes. Will admit for observation, Lexiscan Myoview in am if Troponin remains negative. No history of SOB, I did not order D-dimer.    Angelena Form, PA-C 10/11/2016, 4:27 PM [id number]   Patient seen and examined. Agree with assessment and plan.  Joshua [name] is a 60 year old obese African-American male who is well-known to me.  In November 2012.  He suffered an acute coronary syndrome and at ECG findings of anterolateral ST segment elevation.  Emergent catheterization revealed a 99% mid LAD stenosis and just proximal to this was a 6070% stenosis after the first diagonal vessel.  He also had 95% mid RCA stenoses.  He underwent successful.  Initial acute intervention to his LAD and a 3.032 mm Promus DES stent was inserted.  3 days later he underwent successful staged PCI to his RCA and a 3.018 mm Resolute DES stent was inserted.  He has a long-standing history of hypertension, diabetes mellitus, obesity and hyperlipidemia.  He developed recurrent increasing chest pain leading to repeat catheterization in August 2017.  His prior intervention sites were patent but healed underwent stenting of his proximal LAD and his obtuse marginal branch of the circumflex vessel.  In September 2017.  He underwent transmetatarsal amputation of his right foot by Dr. Sharol Given.  Over the last couple days he has noticed a sharp chest pain sensation which is different from his prior anginal type symptomatology.  His chest pain has been occurring with more  frequency and today became more pronounced leading to his evaluation in the emergency room.  His initial ECG shows sinus rhythm at 75 without acute ST-T changes.  Troponin is negative.  His physical exam is notable for morbid obesity.  There is no JVD.  He has a thick neck.  Now potty scale is 3/4.  Lungs were clear.  He had chest wall tenderness to palpation over the left costochondral region.  Rhythm was regular 6 systolic murmur, unchanged.  There is moderate central adiposity.  There was trace pretibial edema bilaterally and he status post right metatarsal amputation.  Doubt ischemic chest pain by virtue of his somewhat atypical features.  He does have mild concomitant CAD.  Plan to continue the patient's anti-ischemic medications.  He'll be given a trial of Toradol to see if his chest wall discomfort improves.  Will plan to keep the patient overnight for observation with serial enzymes and follow-up ECG in a.m.   [name], MD, [facility] 10/11/2016 5:52 PM

## 2016-10-11 NOTE — Assessment & Plan Note (Signed)
S/P Rt MT amputation 07/24/16, prior to PCI

## 2016-10-11 NOTE — ED Notes (Signed)
Cardiology at bedside.

## 2016-10-11 NOTE — ED Provider Notes (Signed)
[facility] DEPT Provider Note   CSN: [id number] Arrival date & time: 10/11/16  1226     History   Chief Complaint Chief Complaint  Patient presents with  . Chest Pain    HPI PRATEEK KNIPPLE is a 60 y.o. male.  Patient is a 60 year old male with history of hypertension, diabetes, hyperlipidemia, GERD, CHF, CAD (s/p 5 stent placements, most recent stent placed in August 2017, currently on Brilenta) and CKD who presents the ED with complaint of intermittent chest pain. Patient reports over the past few weeks he has had intermittent episodes of sharp chest pain to the left side of his chest which typically lasts for less than a minute and resolved spontaneously. He notes over the past week his chest pain has become more frequent and notes today he has been getting the chest pain approximately every 10-15 minutes. Denies any aggravating or relieving factors. Denies radiation. He reports his chest pain does not feel similar to chest pain he has had in the past related to his heart attacks. Denies fever, chills, lightheadedness, dizziness, headache, cough, shortness of breath, wheezing, palpitations, abdominal pain, nausea, vomiting, diaphoresis, numbness, tingling, weakness. Patient reports he has been taking his home medications as prescribed, endorses taking aspirin this morning.  Cardiologist- Dr. [name]      Past Medical History:  Diagnosis Date  . Chronic combined systolic and diastolic CHF (congestive heart failure) ([place])    a. 07/2015 Echo: EF 45-50%, mod LVH, mid apical/antsept AK, Gr 1 DD.  [name] CKD (chronic kidney disease), stage III   . Coronary artery disease    a. 2012 s/p MI/PCI: s/p DES to mid/dist RCA and overlapping DES to LAD;  b. 07/2015 Cath: LAD 10% ISR, D1 40(jailed), LCX 74m, OM2 30, OM3 30, RCA 64m ISR, 10d ISR; c. 07/2016 NSTEMI/PCI: LAD 85ost/p/m (3.5x20 Synergy DES overlapping prior stent), D1 40, LCX 72m, OM2 95 (2.75x16 Synergy DES), OM3 30, RCA 58m, 10d.    . Diabetic gastroparesis ([name])   . GERD (gastroesophageal reflux disease)   . Gout   . Heart murmur   . Hyperlipemia   . Hypertensive heart disease   . Ischemic cardiomyopathy    a. 07/2015 Echo: EF 45-50%, mod LVH, mid-apicalanteroseptal AK, Gr1 DD.  . Morbid obesity ([place])   . Myocardial infarction 2012  . OSA on CPAP   . Osteomyelitis ([place])    a. 07/2016 MTP joint of R great toe s/p transmetatarsal amputation.  . Polyneuropathy in diabetes(357.2)   . Pulmonary sarcoidosis ([place])    "problems w/it years ago; no problems anymore" (07/03/2014)  . Type II diabetes mellitus [facility])     Patient Active Problem List   Diagnosis Date Noted  . AKI (acute kidney injury) ([place])   . Acute blood loss anemia   . DM type 2 with diabetic peripheral neuropathy ([place])   . Cellulitis and abscess   . S/P angioplasty with stent 07/29/16 DES to LAD ostium to prox vessel overlapping previously placed stents; DES 2nd OM 08/03/2016  . Status post transmetatarsal amputation of right foot ([place]) 08/03/2016  . Physical debility 08/03/2016  . S/P transmetatarsal amputation of foot ([place]) 08/03/2016  . Acute on chronic combined systolic and diastolic CHF, NYHA class 3 ([place])   . Chronic osteomyelitis of right foot ([place])   . Cellulitis and abscess of leg, except foot   . Penetrating wound of right foot   . Type 1 diabetes mellitus with right diabetic foot ulcer ([place])   .  Chronic osteomyelitis of toe of right foot ([place])   . CKD (chronic kidney disease) stage 3, GFR 30-59 ml/min   . CKD (chronic kidney disease)   . Unstable angina pectoris ([place]) 07/18/2016  . Type II diabetes mellitus ([place])   . Chronic combined systolic and diastolic congestive heart failure ([place])   . Coronary artery disease   . Hyperlipemia   . Coronary artery disease involving native coronary artery of native heart without angina pectoris   . Angina pectoris ([place])   . Intermediate coronary syndrome ([place]) 07/03/2014  . Chest pain with moderate  risk of acute coronary syndrome 06/24/2014  . CAD in native artery 06/24/2014  . Sleep apnea- on C-pap 06/24/2014  . Morbid obesity due to excess calories ([place]) 11/10/2013  . Uncontrolled diabetes mellitus with peripheral autonomic neuropathy ([place]) 10/29/2013  . Poorly controlled diabetes mellitus ([place]) 10/29/2013  . Hyperglycemia 10/29/2013  . Presence of stent in anterior descending branch of left coronary artery 10/31/2011  . Hyperlipidemia 01/12/2011  . Benign essential HTN 01/12/2011  . GERD 01/12/2011    Past Surgical History:  Procedure Laterality Date  . AMPUTATION Right 07/24/2016   Procedure: TRANSMETATARSAL FOOT AMPUTATION;  Surgeon: [name], MD;  Location: [facility];  Service: Orthopedics;  Laterality: Right;  . BREAST LUMPECTOMY Right ~ 2000   "benign"  . CARDIAC CATHETERIZATION N/A 07/30/2015   Procedure: Left Heart Cath and Coronary Angiography;  Surgeon: Burnell Blanks, MD;  Location: [facility];  Service: Cardiovascular;  Laterality: N/A;  . CARDIAC CATHETERIZATION N/A 07/19/2016   Procedure: Left Heart Cath and Coronary Angiography;  Surgeon: Belva Crome, MD;  Location: [facility];  Service: Cardiovascular;  Laterality: N/A;  . CARDIAC CATHETERIZATION N/A 07/29/2016   Procedure: Coronary Stent Intervention;  Surgeon: Belva Crome, MD;  Location: [facility];  Service: Cardiovascular;  Laterality: N/A;  . CARDIAC CATHETERIZATION N/A 07/29/2016   Procedure: Intravascular Ultrasound/IVUS;  Surgeon: Belva Crome, MD;  Location: [facility];  Service: Cardiovascular;  Laterality: N/A;  . CATARACT EXTRACTION W/ INTRAOCULAR LENS  IMPLANT, BILATERAL Bilateral 2014-2015  . CORONARY ANGIOPLASTY WITH STENT PLACEMENT  10/31/2011   30 % MID ATRIOVENTRICULAR GROOVE CX STENOSIS. 90 % MID RCA.PTA TO LAD/STENTING OF TANDEM 60-70%. LAD STENOSIS 99% REDUCED TO 0%.3.0 X 32MM PROMUS DES POSTDILATED TO 3.25MM.  [name] CORONARY ANGIOPLASTY WITH STENT  PLACEMENT  07/03/2014   "2"  . I&D EXTREMITY Right 10/30/2013   Procedure: IRRIGATION AND DEBRIDEMENT RIGHT SMALL FINGER POSSIBLE SECONDARY WOUND CLOSURE;  Surgeon: [name], MD;  [facility];  Service: Orthopedics;  Laterality: Right;  [name] is available after 4pm  . I&D EXTREMITY Right 11/01/2013   Procedure:  IRRIGATION AND DEBRIDEMENT RIGHT  PROXIMAL PHALANGEAL LEVEL AMPUTATION;  Surgeon: [name], MD;  [facility];  Service: Orthopedics;  Laterality: Right;  . INCISION AND DRAINAGE Right 10/28/2013   Procedure: INCISION AND DRAINAGE Right small finger;  Surgeon: [name], MD;  [facility];  Service: Orthopedics;  Laterality: Right;  . LEFT HEART CATH Left 11/03/2011   Procedure: LEFT HEART CATH;  Surgeon: [name], MD;  Location: [facility] CATH LAB;  Service: Cardiovascular;  Laterality: Left;  . LEFT HEART CATHETERIZATION WITH CORONARY ANGIOGRAM N/A 07/03/2014   Procedure: LEFT HEART CATHETERIZATION WITH CORONARY ANGIOGRAM;  Surgeon: [name], MD;  Location: [facility] CATH LAB;  Service: Cardiovascular;  Laterality: N/A;  . PERCUTANEOUS CORONARY STENT INTERVENTION (  PCI-S) Left 11/03/2011   Procedure: PERCUTANEOUS CORONARY STENT INTERVENTION (PCI-S);  Surgeon: [name], MD;  Location: [facility] CATH LAB;  Service: Cardiovascular;  Laterality: Left;  . TOE AMPUTATION Right ~ 2010   "2nd toe"  . TOE AMPUTATION Right 2012   "3rd toe"       Home Medications    Prior to Admission medications   Medication Sig Start Date End Date Taking? Authorizing Provider  acetaminophen (TYLENOL) 325 MG tablet Take 2 tablets (650 mg total) by mouth every 4 (four) hours as needed for headache or mild pain. 08/03/16  Yes Isaiah Serge, NP  allopurinol (ZYLOPRIM) 300 MG tablet Take 1 tablet (300 mg total) by mouth daily. 08/13/16  Yes Ivan Anchors Love, PA-C  amLODipine (NORVASC) 10 MG tablet Take 1 tablet (10 mg total) by mouth daily. 08/20/16  Yes [name], MD    aspirin EC 81 MG tablet Take 81 mg by mouth daily.   Yes Historical Provider, MD  hydrALAZINE (APRESOLINE) 25 MG tablet Take 1.5 tablets (37.5 mg total) by mouth 2 (two) times daily. 08/13/16  Yes Ivan Anchors Love, PA-C  insulin NPH Human (HUMULIN N,NOVOLIN N) 100 UNIT/ML injection Use 20 units in morning and 22 units at bedtime. Patient taking differently: Inject 20-25 Units into the skin 2 (two) times daily. Use 25 units in morning and 20 units at bedtime. 08/13/16  Yes Ivan Anchors Love, PA-C  isosorbide mononitrate (IMDUR) 30 MG 24 hr tablet TAKE 3 TABLETS (90 MG TOTAL) BY MOUTH DAILY. 10/04/16  Yes [name], MD  metoprolol succinate (TOPROL-XL) 25 MG 24 hr tablet TAKE 3 TABLETS (75 MG TOTAL) BY MOUTH DAILY. 09/28/16  Yes [name], MD  mupirocin ointment (BACTROBAN) 2 % APPLY TO AFFECTED AREA EVERY DAY 09/02/16  Yes Historical Provider, MD  nitroGLYCERIN (NITROSTAT) 0.4 MG SL tablet Place 1 tablet (0.4 mg total) under the tongue every 5 (five) minutes as needed for chest pain. 10/19/13  Yes [name], MD  pantoprazole (PROTONIX) 40 MG tablet Take 1 tablet (40 mg total) by mouth daily. 08/13/16  Yes Ivan Anchors Love, PA-C  rosuvastatin (CRESTOR) 40 MG tablet Take 1 tablet (40 mg total) by mouth daily at 6 PM. 08/13/16  Yes Ivan Anchors Love, PA-C  ticagrelor (BRILINTA) 90 MG TABS tablet Take 1 tablet (90 mg total) by mouth 2 (two) times daily. 08/13/16  Yes Ivan Anchors Love, PA-C  doxazosin (CARDURA) 8 MG tablet Take 1 tablet (8 mg total) by mouth daily. Patient not taking: Reported on 10/11/2016 08/13/16   Ivan Anchors Love, PA-C  doxycycline (VIBRA-TABS) 100 MG tablet Take 100 mg by mouth 2 (two) times daily. 09/02/16   Historical Provider, MD  NOVOLIN R 100 UNIT/ML injection Sliding scale as directed 70-150 = 3 units 151-200 =5 units 201-250 =7 units 251-300 =9 units >300 = 12 units 09/06/16   Historical Provider, MD  oxyCODONE (ROXICODONE) 5 MG immediate release tablet Take 1 tablet (5 mg total) by mouth  daily as needed for severe pain. Patient not taking: Reported on 10/11/2016 08/26/16   Ankit Lorie Phenix, MD    Family History Family History  Problem Relation Age of Onset  . Heart attack Maternal Aunt   . Diabetes Maternal Grandmother   . Hypertension Maternal Grandmother     Social History Social History  Substance Use Topics  . Smoking status: Never Smoker  . Smokeless tobacco: Never Used  . Alcohol use Yes  Comment: Pt sts he binge drinks on the weekends     Allergies   Review of patient's allergies indicates no known allergies.   Review of Systems Review of Systems  Cardiovascular: Positive for chest pain.  All other systems reviewed and are negative.    Physical Exam Updated Vital Signs BP 120/66   Pulse 70   Temp 98.1 F (36.7 C) (Oral)   Resp 20   Ht $R'5\' 9"'Bh$  (1.753 m)   Wt 131.5 kg   SpO2 98%   BMI 42.83 kg/m   Physical Exam  Constitutional: He is oriented to person, place, and time. He appears well-developed and well-nourished. No distress.  HENT:  Head: Normocephalic and atraumatic.  Mouth/Throat: No oropharyngeal exudate.  Eyes: Conjunctivae and EOM are normal. Pupils are equal, round, and reactive to light. Right eye exhibits no discharge. Left eye exhibits no discharge. No scleral icterus.  Neck: Normal range of motion. Neck supple.  Cardiovascular: Normal rate, regular rhythm, normal heart sounds and intact distal pulses.   Pulmonary/Chest: Effort normal and breath sounds normal. No respiratory distress. He has no wheezes. He has no rales (mild TTP over left anterior chest wall). He exhibits tenderness.  Abdominal: Soft. Bowel sounds are normal. He exhibits no distension and no mass. There is no tenderness. There is no rebound and no guarding. No hernia.  Musculoskeletal: Normal range of motion. He exhibits edema. He exhibits no tenderness.  Amputation of right toes. Mild nonpitting edema bilaterally, right slightly worse than left.    Neurological: He is alert and oriented to person, place, and time.  Skin: Skin is warm and dry. He is not diaphoretic.  Nursing note and vitals reviewed.    ED Treatments / Results  Labs (all labs ordered are listed, but only abnormal results are displayed) Labs Reviewed  BASIC METABOLIC PANEL - Abnormal; Notable for the following:       Result Value   Glucose, Bld 116 (*)    Creatinine, Ser 1.54 (*)    Calcium 8.8 (*)    GFR calc non Af Amer 48 (*)    GFR calc Af Amer 56 (*)    All other components within normal limits  CBC - Abnormal; Notable for the following:    Hemoglobin 11.3 (*)    HCT 36.1 (*)    RDW 16.0 (*)    All other components within normal limits  I-STAT TROPOININ, ED  I-STAT TROPOININ, ED    EKG  EKG Interpretation  Date/Time:  Monday October 11 2016 12:34:45 EDT Ventricular Rate:  75 PR Interval:  202 QRS Duration: 78 QT Interval:  396 QTC Calculation: 442 R Axis:   33 Text Interpretation:  Normal sinus rhythm Possible Left atrial enlargement Low voltage QRS Septal infarct , age undetermined Abnormal ECG no significant change since Aug 2017 Confirmed by Regenia Skeeter MD, SCOTT [phone]) on 10/11/2016 2:33:42 PM       Radiology Dg Chest 2 View  Result Date: 10/11/2016 CLINICAL DATA:  Left-sided chest pain over the last 2 weeks, worsening today. EXAM: CHEST  2 VIEW COMPARISON:  07/18/2016 FINDINGS: The heart is enlarged. There is aortic atherosclerosis. There is chronic interstitial lung disease. Some of the interstitial markings appear more prominent and are suspicious for interstitial edema superimposed on chronic lung disease. No consolidation, collapse or effusion. No acute bone finding. IMPRESSION: Background pattern of cardiomegaly, atherosclerosis and chronic interstitial lung disease. Suspicion of superimposed mild acute interstitial edema. Electronically Signed   By: Nelson Chimes  M.D.   On: 10/11/2016 13:10    Procedures Procedures (including  critical care time)  Medications Ordered in ED Medications - No data to display   Initial Impression / Assessment and Plan / ED Course  I have reviewed the triage vital signs and the nursing notes.  Pertinent labs & imaging results that were available during my care of the patient were reviewed by me and considered in my medical decision making (see chart for details).  Clinical Course   Patient presents with intermittent chest pain that has been present for the past few weeks but reports having more frequent episodes over the past week. Denies any aggravating or alleviating factors. Reports chest pain is not consistent with pain he has had in the past related to his heart attacks. History of extensive cardiac disease with multiple stents placed, most recent stent placed in August 2017. VSS. Exam revealed mild tenderness over left anterior chest wall, lungs clear to auscultation bilaterally. Patient denies any chest pain at this time.  EKG showed sinus rhythm with left atrial enlargement, no acute ischemic changes, no significant changes from prior. Troponin negative. Labs unremarkable. Chest x-ray revealed background pattern of cardiomegaly, atherosclerosis and chronic interstitial lung disease. HEART score 4. Consulted cardiology regarding pt's atypical chest pain but concern due to extensive cardiac hx of recent stent placement. Dispo pending cardio evaluation.  Hand-off to [employer], PA-C.  Final Clinical Impressions(s) / ED Diagnoses   Final diagnoses:  None    New Prescriptions New Prescriptions   No medications on file     Nona Dell, PA-C 10/11/16 1603    Sherwood Gambler, MD 10/20/16 0100

## 2016-10-11 NOTE — Progress Notes (Signed)
Wound Care Note   Patient: Joshua [name]           Date of Birth: [birth year]           MRN: [id number]             PCP: [name], MD Visit Date: 10/11/2016   Assessment & Plan: Visit Diagnoses:  1. S/P transmetatarsal amputation of foot, right ([place])   2. Type 1 diabetes mellitus with right diabetic foot ulcer ([place])     Plan: Discussed orthotics and spacer. Patient declined at this time. Antibacterial dressing changes daily. May advance weightbearing Follow-up in office in 4 weeks.   Follow-Up Instructions: Return in about 4 weeks (around 11/08/2016).  Orders:  No orders of the defined types were placed in this encounter.  No orders of the defined types were placed in this encounter.     Procedures: No notes on file   Clinical Data: No additional findings.   No images are attached to the encounter.   Subjective: Chief Complaint  Patient presents with  . Right Foot - Routine Post Op, Follow-up  . Follow-up    4 week follow up right transmetatarsal amputation on 07/24/16    Patient presents for 4 week follow up right transmetatarsal amputation on 07/24/2016. Not having to do any dressing changes past week and half. He does have swelling left ankle and leg, but patient believes this may be a cardiac issue. He has occasional pain when weightbearing at bottom of his foot. Incision is healed there are no open areas. He is full weightbearing with regular shoe wear.     Review of Systems  Constitutional: Negative for chills and fever.  All other systems reviewed and are negative.   Miscellaneous:  -Home Health Care: no  -Physical Therapy: no  -Out of Work?: yes     Objective: Vital Signs: Ht $RemoveB'5\' 9"'TvXjJTCi$  (1.753 m)   Wt 290 lb (131.5 kg)   BMI 42.83 kg/m   Physical Exam: Massive swelling right foot. No pitting in lower leg. Majority of amputation has healed. There is a small open area laterally. This is 4mm x 2 mm. No drainage, erythema or sign of  infection. Antibacterial dressing applied.  Specialty Comments: No specialty comments available.   PMFS History: Patient Active Problem List   Diagnosis Date Noted  . AKI (acute kidney injury) ([place])   . Acute blood loss anemia   . DM type 2 with diabetic peripheral neuropathy ([place])   . Cellulitis and abscess   . S/P angioplasty with stent 07/29/16 DES to LAD ostium to prox vessel overlapping previously placed stents; DES 2nd OM 08/03/2016  . Status post transmetatarsal amputation of right foot ([place]) 08/03/2016  . Physical debility 08/03/2016  . S/P transmetatarsal amputation of foot ([place]) 08/03/2016  . Acute on chronic combined systolic and diastolic CHF, NYHA class 3 ([place])   . Chronic osteomyelitis of right foot ([place])   . Cellulitis and abscess of leg, except foot   . Penetrating wound of right foot   . Type 1 diabetes mellitus with right diabetic foot ulcer ([place])   . Chronic osteomyelitis of toe of right foot ([place])   . CKD (chronic kidney disease) stage 3, GFR 30-59 ml/min   . CKD (chronic kidney disease)   . Unstable angina pectoris ([place]) 07/18/2016  . Type II diabetes mellitus ([place])   . Chronic combined systolic and diastolic congestive heart failure ([place])   . Coronary artery disease   .  Hyperlipemia   . Coronary artery disease involving native coronary artery of native heart without angina pectoris   . Angina pectoris ([place])   . Intermediate coronary syndrome ([place]) 07/03/2014  . Chest pain with moderate risk of acute coronary syndrome 06/24/2014  . CAD in native artery 06/24/2014  . Sleep apnea- on C-pap 06/24/2014  . Morbid obesity due to excess calories ([place]) 11/10/2013  . Uncontrolled diabetes mellitus with peripheral autonomic neuropathy ([place]) 10/29/2013  . Poorly controlled diabetes mellitus ([place]) 10/29/2013  . Hyperglycemia 10/29/2013  . Presence of stent in anterior descending branch of left coronary artery 10/31/2011  . Hyperlipidemia 01/12/2011  . Benign essential  HTN 01/12/2011  . GERD 01/12/2011   Past Medical History:  Diagnosis Date  . Chronic combined systolic and diastolic CHF (congestive heart failure) ([place])    a. 07/2015 Echo: EF 45-50%, mod LVH, mid apical/antsept AK, Gr 1 DD.  [name] CKD (chronic kidney disease), stage III   . Coronary artery disease    a. 2012 s/p MI/PCI: s/p DES to mid/dist RCA and overlapping DES to LAD;  b. 07/2015 Cath: LAD 10% ISR, D1 40(jailed), LCX 49m, OM2 30, OM3 30, RCA 37m ISR, 10d ISR; c. 07/2016 NSTEMI/PCI: LAD 85ost/p/m (3.5x20 Synergy DES overlapping prior stent), D1 40, LCX 59m, OM2 95 (2.75x16 Synergy DES), OM3 30, RCA 76m, 10d.  . Diabetic gastroparesis ([place])   . GERD (gastroesophageal reflux disease)   . Gout   . Heart murmur   . Hyperlipemia   . Hypertensive heart disease   . Ischemic cardiomyopathy    a. 07/2015 Echo: EF 45-50%, mod LVH, mid-apicalanteroseptal AK, Gr1 DD.  . Morbid obesity ([place])   . Myocardial infarction 2012  . OSA on CPAP   . Osteomyelitis ([place])    a. 07/2016 MTP joint of R great toe s/p transmetatarsal amputation.  . Polyneuropathy in diabetes(357.2)   . Pulmonary sarcoidosis ([place])    "problems w/it years ago; no problems anymore" (07/03/2014)  . Type II diabetes mellitus ([facility])     Family History  Problem Relation Age of Onset  . Heart attack Maternal Aunt   . Diabetes Maternal Grandmother   . Hypertension Maternal Grandmother    Past Surgical History:  Procedure Laterality Date  . AMPUTATION Right 07/24/2016   Procedure: TRANSMETATARSAL FOOT AMPUTATION;  Surgeon: [name], MD;  Location: [facility];  Service: Orthopedics;  Laterality: Right;  . BREAST LUMPECTOMY Right ~ 2000   "benign"  . CARDIAC CATHETERIZATION N/A 07/30/2015   Procedure: Left Heart Cath and Coronary Angiography;  Surgeon: Burnell Blanks, MD;  Location: [facility] [facility] LAB;  Service: Cardiovascular;  Laterality: N/A;  . CARDIAC CATHETERIZATION N/A 07/19/2016   Procedure: Left Heart Cath and Coronary  Angiography;  Surgeon: Belva Crome, MD;  Location: [facility];  Service: Cardiovascular;  Laterality: N/A;  . CARDIAC CATHETERIZATION N/A 07/29/2016   Procedure: Coronary Stent Intervention;  Surgeon: Belva Crome, MD;  Location: [facility] LAB;  Service: Cardiovascular;  Laterality: N/A;  . CARDIAC CATHETERIZATION N/A 07/29/2016   Procedure: Intravascular Ultrasound/IVUS;  Surgeon: Belva Crome, MD;  Location: [facility] LAB;  Service: Cardiovascular;  Laterality: N/A;  . CATARACT EXTRACTION W/ INTRAOCULAR LENS  IMPLANT, BILATERAL Bilateral 2014-2015  . CORONARY ANGIOPLASTY WITH STENT PLACEMENT  10/31/2011   30 % MID ATRIOVENTRICULAR GROOVE CX STENOSIS. 90 % MID RCA.PTA TO LAD/STENTING OF TANDEM 60-70%. LAD STENOSIS 99% REDUCED TO 0%.3.0 X 32MM PROMUS DES POSTDILATED TO 3.25MM.  [name]  CORONARY ANGIOPLASTY WITH STENT PLACEMENT  07/03/2014   "2"  . I&D EXTREMITY Right 10/30/2013   Procedure: IRRIGATION AND DEBRIDEMENT RIGHT SMALL FINGER POSSIBLE SECONDARY WOUND CLOSURE;  Surgeon: [name], MD;  [facility];  Service: Orthopedics;  Laterality: Right;  [name] is available after 4pm  . I&D EXTREMITY Right 11/01/2013   Procedure:  IRRIGATION AND DEBRIDEMENT RIGHT  PROXIMAL PHALANGEAL LEVEL AMPUTATION;  Surgeon: [name], MD;  [facility];  Service: Orthopedics;  Laterality: Right;  . INCISION AND DRAINAGE Right 10/28/2013   Procedure: INCISION AND DRAINAGE Right small finger;  Surgeon: [name], MD;  [facility];  Service: Orthopedics;  Laterality: Right;  . LEFT HEART CATH Left 11/03/2011   Procedure: LEFT HEART CATH;  Surgeon: [name], MD;  Location: [facility] CATH LAB;  Service: Cardiovascular;  Laterality: Left;  . LEFT HEART CATHETERIZATION WITH CORONARY ANGIOGRAM N/A 07/03/2014   Procedure: LEFT HEART CATHETERIZATION WITH CORONARY ANGIOGRAM;  Surgeon: [name], MD;  Location: [facility] CATH LAB;  Service: Cardiovascular;  Laterality:  N/A;  . PERCUTANEOUS CORONARY STENT INTERVENTION (PCI-S) Left 11/03/2011   Procedure: PERCUTANEOUS CORONARY STENT INTERVENTION (PCI-S);  Surgeon: [name], MD;  Location: [facility] CATH LAB;  Service: Cardiovascular;  Laterality: Left;  . TOE AMPUTATION Right ~ 2010   "2nd toe"  . TOE AMPUTATION Right 2012   "3rd toe"   Social History   Occupational History  . Not on file.   Social History Main Topics  . Smoking status: Never Smoker  . Smokeless tobacco: Never Used  . Alcohol use Yes     Comment: Pt sts he binge drinks on the weekends  . Drug use: No  . Sexual activity: Yes

## 2016-10-12 ENCOUNTER — Observation Stay (HOSPITAL_COMMUNITY): Payer: BLUE CROSS/BLUE SHIELD

## 2016-10-12 ENCOUNTER — Observation Stay (HOSPITAL_BASED_OUTPATIENT_CLINIC_OR_DEPARTMENT_OTHER): Payer: BLUE CROSS/BLUE SHIELD

## 2016-10-12 DIAGNOSIS — I130 Hypertensive heart and chronic kidney disease with heart failure and stage 1 through stage 4 chronic kidney disease, or unspecified chronic kidney disease: Secondary | ICD-10-CM | POA: Diagnosis not present

## 2016-10-12 DIAGNOSIS — I2510 Atherosclerotic heart disease of native coronary artery without angina pectoris: Secondary | ICD-10-CM | POA: Diagnosis not present

## 2016-10-12 DIAGNOSIS — I252 Old myocardial infarction: Secondary | ICD-10-CM | POA: Diagnosis not present

## 2016-10-12 DIAGNOSIS — R079 Chest pain, unspecified: Secondary | ICD-10-CM

## 2016-10-12 DIAGNOSIS — R0789 Other chest pain: Secondary | ICD-10-CM | POA: Diagnosis not present

## 2016-10-12 LAB — BASIC METABOLIC PANEL
Anion gap: 5 (ref 5–15)
BUN: 17 mg/dL (ref 6–20)
CO2: 25 mmol/L (ref 22–32)
Calcium: 8.7 mg/dL — ABNORMAL LOW (ref 8.9–10.3)
Chloride: 111 mmol/L (ref 101–111)
Creatinine, Ser: 1.46 mg/dL — ABNORMAL HIGH (ref 0.61–1.24)
GFR calc Af Amer: 59 mL/min — ABNORMAL LOW (ref >60)
GFR calc non Af Amer: 51 mL/min — ABNORMAL LOW (ref >60)
Glucose, Bld: 135 mg/dL — ABNORMAL HIGH (ref 65–99)
Potassium: 3.5 mmol/L (ref 3.5–5.1)
Sodium: 141 mmol/L (ref 135–145)

## 2016-10-12 LAB — GLUCOSE, CAPILLARY
Glucose-Capillary: 138 mg/dL — ABNORMAL HIGH (ref 65–99)
Glucose-Capillary: 197 mg/dL — ABNORMAL HIGH (ref 65–99)
Glucose-Capillary: 169 mg/dL — ABNORMAL HIGH (ref 65–99)
Glucose-Capillary: 140 mg/dL — ABNORMAL HIGH (ref 65–99)

## 2016-10-12 LAB — TROPONIN I: Troponin I: <0.03 ng/mL (ref <0.03)

## 2016-10-12 MED ORDER — TECHNETIUM TC 99M TETROFOSMIN IV KIT: 10.0000 | PACK | Freq: Once | INTRAVENOUS | Status: DC | PRN

## 2016-10-12 MED ORDER — REGADENOSON 0.4 MG/5ML IV SOLN
0.4000 mg | Freq: Once | INTRAVENOUS | Status: AC
  Administered 2016-10-12: 0.4 mg via INTRAVENOUS
  Filled 2016-10-12: qty 5

## 2016-10-12 MED ORDER — ALLOPURINOL 300 MG PO TABS
300.0000 mg | ORAL_TABLET | Freq: Every day | ORAL | Status: DC
  Administered 2016-10-12 – 2016-10-13 (×2): 300 mg via ORAL
  Filled 2016-10-12 (×2): qty 1

## 2016-10-12 MED ORDER — REGADENOSON 0.4 MG/5ML IV SOLN
INTRAVENOUS | Status: AC
  Administered 2016-10-12: 11:00:00
  Filled 2016-10-12: qty 5

## 2016-10-12 MED ORDER — TECHNETIUM TC 99M TETROFOSMIN IV KIT
30.0000 | PACK | Freq: Once | INTRAVENOUS | Status: AC | PRN
  Administered 2016-10-12: 30 via INTRAVENOUS

## 2016-10-12 MED ORDER — ROSUVASTATIN CALCIUM 10 MG PO TABS
40.0000 mg | ORAL_TABLET | Freq: Every day | ORAL | Status: DC
  Administered 2016-10-12 – 2016-10-13 (×2): 40 mg via ORAL
  Filled 2016-10-12 (×3): qty 4

## 2016-10-12 MED ORDER — ISOSORBIDE MONONITRATE ER 60 MG PO TB24
90.0000 mg | ORAL_TABLET | Freq: Every day | ORAL | Status: DC
  Administered 2016-10-12 – 2016-10-13 (×2): 90 mg via ORAL
  Filled 2016-10-12 (×2): qty 1

## 2016-10-12 MED ORDER — PANTOPRAZOLE SODIUM 40 MG PO TBEC
40.0000 mg | DELAYED_RELEASE_TABLET | Freq: Every day | ORAL | Status: DC
  Administered 2016-10-12 – 2016-10-13 (×2): 40 mg via ORAL
  Filled 2016-10-12 (×2): qty 1

## 2016-10-12 MED ORDER — AMLODIPINE BESYLATE 10 MG PO TABS
10.0000 mg | ORAL_TABLET | Freq: Every day | ORAL | Status: DC
  Administered 2016-10-12 – 2016-10-13 (×2): 10 mg via ORAL
  Filled 2016-10-12 (×2): qty 1

## 2016-10-12 MED ORDER — REGADENOSON 0.4 MG/5ML IV SOLN
INTRAVENOUS | Status: AC
  Filled 2016-10-12: qty 5

## 2016-10-12 MED ORDER — TICAGRELOR 90 MG PO TABS
90.0000 mg | ORAL_TABLET | Freq: Two times a day (BID) | ORAL | Status: DC
  Administered 2016-10-12 – 2016-10-13 (×3): 90 mg via ORAL
  Filled 2016-10-12 (×3): qty 1

## 2016-10-12 MED ORDER — METOPROLOL SUCCINATE ER 50 MG PO TB24
75.0000 mg | ORAL_TABLET | Freq: Every day | ORAL | Status: DC
  Administered 2016-10-12 – 2016-10-13 (×2): 75 mg via ORAL
  Filled 2016-10-12 (×2): qty 1

## 2016-10-12 MED ORDER — TICAGRELOR 90 MG PO TABS: 90.0000 mg | ORAL_TABLET | Freq: Two times a day (BID) | ORAL | Status: DC

## 2016-10-12 MED ORDER — HYDRALAZINE HCL 25 MG PO TABS
37.5000 mg | ORAL_TABLET | Freq: Two times a day (BID) | ORAL | Status: DC
  Administered 2016-10-12 – 2016-10-13 (×3): 37.5 mg via ORAL
  Filled 2016-10-12 (×3): qty 2

## 2016-10-12 MED ORDER — KETOROLAC TROMETHAMINE 30 MG/ML IJ SOLN
30.0000 mg | Freq: Once | INTRAMUSCULAR | Status: AC
  Administered 2016-10-12: 30 mg via INTRAVENOUS
  Filled 2016-10-12: qty 1

## 2016-10-12 NOTE — Progress Notes (Signed)
Lexiscan MV performed, day 1 of 2-day study. [facility] to read.  Rosaria Ferries, PA-C 10/12/2016 11:54 AM Beeper [phone]

## 2016-10-12 NOTE — Progress Notes (Signed)
 Patient Name: Joshua [name] Date of Encounter: 10/12/2016  Primary Cardiologist: Dr.   Hospital Problem List     Principal Problem:   Chest pain with moderate risk for cardiac etiology Active Problems:   Hyperlipidemia   Benign essential HTN   Morbid obesity due to excess calories (HCC)   CAD S/P multiple PCIs   Sleep apnea- on C-pap   CKD (chronic kidney disease) stage 3, GFR 30-59 ml/min   Chronic osteomyelitis of right foot (HCC)   DM type 2 with diabetic peripheral neuropathy (HCC)     Subjective   Feels better  Inpatient Medications    Scheduled Meds: . aspirin EC  81 mg Oral Daily  . insulin aspart  0-15 Units Subcutaneous TID WC  . insulin aspart  0-5 Units Subcutaneous QHS  . regadenoson  0.4 mg Intravenous Once  . sodium chloride flush  3 mL Intravenous Q12H  . ticagrelor  90 mg Oral BID   Continuous Infusions:   PRN Meds: sodium chloride, acetaminophen, ALPRAZolam, nitroGLYCERIN, ondansetron (ZOFRAN) IV, sodium chloride flush, traMADol, zolpidem   Vital Signs    Vitals:   10/11/16 2100 10/12/16 0500 10/12/16 0751 10/12/16 0858  BP: (!) 161/91 (!) 160/88 (!) 163/90   Pulse: 65 79 74 71  Resp: 15 16 18   Temp: 98.2 F (36.8 C) 97.9 F (36.6 C) 98.6 F (37 C)   TempSrc:   Oral   SpO2: 99% 99% 98%   Weight:  (!) 310 lb 3.2 oz (140.7 kg)    Height:        Intake/Output Summary (Last 24 hours) at 10/12/16 0925 Last data filed at 10/12/16 0500  Gross per 24 hour  Intake              120 ml  Output                0 ml  Net              120 ml   Filed Weights   10/11/16 1231 10/11/16 1810 10/12/16 0500  Weight: 290 lb (131.5 kg) (!) 309 lb 11.2 oz (140.5 kg) (!) 310 lb 3.2 oz (140.7 kg)    Physical Exam   GEN: Well nourished, well developed, in no acute distress.  HEENT: Grossly normal.  Neck: Supple, no JVD, carotid bruits, or masses. Cardiac: RRR, no murmurs, rubs, or gallops. No clubbing, cyanosis, edema.  Radials/DP/PT 2+ and  equal bilaterally.  Respiratory:  Respirations regular and unlabored, clear to auscultation bilaterally. GI: Soft, nontender, nondistended, BS + x 4. MS: no deformity or atrophy. Skin: warm and dry, no rash. Neuro:  Strength and sensation are intact. Psych: AAOx3.  Normal affect.  Labs    CBC  Recent Labs  10/11/16 1232 10/11/16 1713  WBC 7.8 6.0  HGB 11.3* 10.6*  HCT 36.1* 33.5*  MCV 84.5 84.0  PLT 246 218   Basic Metabolic Panel  Recent Labs  10/11/16 1232 10/11/16 1713 10/12/16 0445  NA 141  --  141  K 3.5  --  3.5  CL 111  --  111  CO2 23  --  25  GLUCOSE 116*  --  135*  BUN 17  --  17  CREATININE 1.54* 1.42* 1.46*  CALCIUM 8.8*  --  8.7*     Recent Labs  10/11/16 1713 10/11/16 2206 10/12/16 0445  TROPONINI <0.03 <0.03 <0.03     Telemetry     NSR- Personally Reviewed    ECG     NSR with 1st degree AVB- Personally Reviewed  Radiology    Dg Chest 2 View  Result Date: 10/11/2016 CLINICAL DATA:  Left-sided chest pain over the last 2 weeks, worsening today. EXAM: CHEST  2 VIEW COMPARISON:  07/18/2016 FINDINGS: The heart is enlarged. There is aortic atherosclerosis. There is chronic interstitial lung disease. Some of the interstitial markings appear more prominent and are suspicious for interstitial edema superimposed on chronic lung disease. No consolidation, collapse or effusion. No acute bone finding. IMPRESSION: Background pattern of cardiomegaly, atherosclerosis and chronic interstitial lung disease. Suspicion of superimposed mild acute interstitial edema. Electronically Signed   By: Mark  Shogry M.D.   On: 10/11/2016 13:10    Cardiac Studies   07/29/16  Coronary Stent Intervention  Intravascular Ultrasound/IVUS   Mid RCA lesion, 50 %stenosed.  Mid RCA to Dist RCA lesion, 10 %stenosed.  RPDA lesion, 30 %stenosed.  Mid Cx lesion, 75 %stenosed.  3rd Mrg lesion, 30 %stenosed.  Ost 1st Diag to 1st Diag lesion, 40 %stenosed.  Dist LAD  lesion, 10 %stenosed.  Ost LAD to Prox LAD lesion, 85 %stenosed.  A STENT SYNERGY DES 3.5X20 drug eluting stent was successfully placed, and overlaps previously placed stent.  Prox LAD to Mid LAD lesion, 85 %stenosed.  Post intervention, there is a 0% residual stenosis.  A STENT SYNERGY DES 2.75X16 drug eluting stent was successfully placed, and does not overlap previously placed stent.  2nd Mrg lesion, 95 %stenosed.  Post intervention, there is a 0% residual stenosis.    Successful stenting of proximal 85% LAD stenosis with a Synergy 3.5 x 20 drug-eluting stent from the LAD ostium to the proximal vessel overlapping previously placed stents. A 0% stenosis is noted post implantation. Final postdilatation diameter of 3.75 mm.  Successful stenting of the second obtuse marginal from 90% to 0% using a Synergy 2.75 x 16 mm DES deployed at 14 atm. Postdilatation was not performed.  Total contrast 95 cc  RECOMMENDATIONS:  IV fluids at 75 cc by our for 18 hours. Basic metabolic panel in a.m.   Patient Profile     [name] is a 60 year old male with a past medical history of CAD, s/p prior stenting of the RCA and LAD in 2012, and staged PCI with a DES to the LAD and OM2 on 07/29/16, (5 days s/p Rt MT amputation for osteomyelitis). Other history includes hypertension, hyperlipidemia, diabetes with neuropathy, CRI, GERD with gastroparesis and also mild osteomyelitis status post prior toe amputations on the right in 2010 and 2012.    Presented with left sided chest pain x 2 weeks. For stress testing today.   Assessment & Plan   1. Chest pain with moderate risk of ACS:  With recent staged PCI with DES to the LAD and OM2 in August of this year. Will continue ASA and Brilinta.   For nuclear stress test today. His chest pain has atypical features, but with concomitant CAD.   2. DM: On sliding scale, last a1c was 9.5.   3. Chronic renal insufficiency: stable.   Signed, Erin E Smith, NP   10/12/2016, 9:25 AM    Patient seen and examined. Agree with assessment and plan. Chest pain is improved following toradol  X2.  Much less costochondral tenderness.  Suspect non ischemic etiology to chest pain. Pt had been scheduled for non-invasive nuclear assessment due to increased prolong symptoms yesterday. With large body habitus, need 2 day protocol.  Will give an additional   toradol dose x1 today.   [name], MD, [facility] 10/12/2016 3:07 PM

## 2016-10-13 ENCOUNTER — Encounter (HOSPITAL_COMMUNITY): Payer: Self-pay | Admitting: General Practice

## 2016-10-13 DIAGNOSIS — I2510 Atherosclerotic heart disease of native coronary artery without angina pectoris: Secondary | ICD-10-CM | POA: Diagnosis not present

## 2016-10-13 DIAGNOSIS — R0789 Other chest pain: Secondary | ICD-10-CM | POA: Diagnosis not present

## 2016-10-13 DIAGNOSIS — I252 Old myocardial infarction: Secondary | ICD-10-CM | POA: Diagnosis not present

## 2016-10-13 DIAGNOSIS — I130 Hypertensive heart and chronic kidney disease with heart failure and stage 1 through stage 4 chronic kidney disease, or unspecified chronic kidney disease: Secondary | ICD-10-CM | POA: Diagnosis not present

## 2016-10-13 DIAGNOSIS — R079 Chest pain, unspecified: Secondary | ICD-10-CM | POA: Diagnosis not present

## 2016-10-13 LAB — NM MYOCAR MULTI W/SPECT W/WALL MOTION / EF
Estimated workload: 1 METS
Exercise duration (min): 9 min
Exercise duration (sec): 40 s
LV dias vol: 155 mL (ref 62–150)
LV sys vol: 94 mL
MPHR: 162 {beats}/min
Peak HR: 90 {beats}/min
Percent HR: 55 %
RATE: 0.42
Rest HR: 71 {beats}/min
SDS: 0
SRS: 5
SSS: 2
TID: 1.29

## 2016-10-13 LAB — BASIC METABOLIC PANEL
Anion gap: 6 (ref 5–15)
BUN: 17 mg/dL (ref 6–20)
CO2: 25 mmol/L (ref 22–32)
Calcium: 8.7 mg/dL — ABNORMAL LOW (ref 8.9–10.3)
Chloride: 110 mmol/L (ref 101–111)
Creatinine, Ser: 1.38 mg/dL — ABNORMAL HIGH (ref 0.61–1.24)
GFR calc Af Amer: >60 mL/min (ref >60)
GFR calc non Af Amer: 55 mL/min — ABNORMAL LOW (ref >60)
Glucose, Bld: 136 mg/dL — ABNORMAL HIGH (ref 65–99)
Potassium: 3.6 mmol/L (ref 3.5–5.1)
Sodium: 141 mmol/L (ref 135–145)

## 2016-10-13 LAB — CBC
HCT: 36 % — ABNORMAL LOW (ref 39.0–52.0)
Hemoglobin: 11.2 g/dL — ABNORMAL LOW (ref 13.0–17.0)
MCH: 26.5 pg (ref 26.0–34.0)
MCHC: 31.1 g/dL (ref 30.0–36.0)
MCV: 85.3 fL (ref 78.0–100.0)
Platelets: 205 10*3/uL (ref 150–400)
RBC: 4.22 MIL/uL (ref 4.22–5.81)
RDW: 16.1 % — ABNORMAL HIGH (ref 11.5–15.5)
WBC: 5.7 10*3/uL (ref 4.0–10.5)

## 2016-10-13 LAB — GLUCOSE, CAPILLARY
Glucose-Capillary: 138 mg/dL — ABNORMAL HIGH (ref 65–99)
Glucose-Capillary: 200 mg/dL — ABNORMAL HIGH (ref 65–99)
Glucose-Capillary: 169 mg/dL — ABNORMAL HIGH (ref 65–99)

## 2016-10-13 MED ORDER — METOPROLOL SUCCINATE ER 100 MG PO TB24: 100.0000 mg | ORAL_TABLET | Freq: Every day | ORAL | 3 refills | Status: DC

## 2016-10-13 MED ORDER — HYDRALAZINE HCL 50 MG PO TABS: 50.0000 mg | ORAL_TABLET | Freq: Two times a day (BID) | ORAL | 11 refills | Status: DC

## 2016-10-13 MED ORDER — TECHNETIUM TC 99M TETROFOSMIN IV KIT
30.0000 | PACK | Freq: Once | INTRAVENOUS | Status: AC | PRN
  Administered 2016-10-13: 30 via INTRAVENOUS

## 2016-10-13 NOTE — Discharge Summary (Signed)
Discharge Summary    Patient ID: Joshua [name],  MRN: [id number], DOB/AGE: 58-02-1956 58 y.o.  Admit date: 10/11/2016 Discharge date: 10/13/2016  Primary Care Provider: [name] Primary Cardiologist: Dr. Claiborne Billings  Discharge Diagnoses    Principal Problem:   Chest pain with moderate risk for cardiac etiology Active Problems:   Hyperlipidemia   Benign essential HTN   Morbid obesity due to excess calories ([place])   CAD S/P multiple PCIs   Sleep apnea- on C-pap   CKD (chronic kidney disease) stage 3, GFR 30-59 ml/min   Chronic osteomyelitis of right foot ([place])   DM type 2 with diabetic peripheral neuropathy (HCC)   Allergies No Known Allergies  Diagnostic Studies/Procedures    Myoview 10/13/2016  There was no ST segment deviation noted during stress.  The left ventricular ejection fraction is moderately decreased (30-44%).  Nuclear stress EF: 39%. The LV function has decreased compared to previous LV assessments by cath and echo.  There is no evidence of ischemia.  This is an intermediate risk study based on the reduced LV function. 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 Other history includes hypertension, hyperlipidemia, diabetes with neuropathy, CRI, GERD with gastroparesis and also mild osteomyelitis status post prior toe amputations on the right in 2010 and 2012.   He saw Ignacia Bayley in the office for follow up 08/31/16 and was doing well. He presents to the ED now with complaints of localized Lt sided chest pain for two weeks. His symptoms have been intermittent, not exertional. He feels like his symptoms have accelerated the last two days. He has not used  NTG "I've thought about it". He denies any associated dyspnea or chest pressure (his predominant pre PCI symptoms). In the ED he admits to some localized symptoms and chest tenderness.   Hospital Course     He was admitted to cardiology service, overnight, his serial troponin was negative. He was placed on 2 day Myoview which came back on 10/13/2016 showing ejection fraction 39%, no evidence of ischemia. Note, he is ejection fraction was normal almost recent cardiac cath on 07/19/2016. I have reviewed this with Dr. Claiborne Billings who has cleared the patient for discharge. His blood pressure has been fairly uncontrolled, after discussing with Dr. Claiborne Billings, we plan to increase his hydralazine to 50 mg 3 times a day and also increase his Toprol-XL to 100 g daily. I will defer to Dr. Claiborne Billings to see if he wanted outpatient echocardiogram to reassess the ejection fraction. I have sent a staff message to our scheduler to contact the patient to arrange outpatient follow-up.  _____________  Discharge Vitals Blood pressure (!) 148/77, pulse 64, temperature 98.5 F (36.9 C), temperature source Oral, resp. rate 18, height $RemoveBe'5\' 9"'uFVSDhsIe$  (1.753 m), weight (!) 305 lb 4.8 oz (138.5 kg), SpO2 93 %.  Filed Weights   10/11/16 1810 10/12/16 0500 10/13/16 0632  Weight: (!) 309 lb 11.2 oz (140.5 kg) (!) 310 lb 3.2 oz (140.7 kg) (!) 305 lb 4.8 oz (138.5 kg)    Labs & Radiologic Studies     CBC  Recent Labs  10/11/16 1713 10/13/16 0541  WBC 6.0 5.7  HGB 10.6* 11.2*  HCT 33.5* 36.0*  MCV 84.0 85.3  PLT 218 540   Basic Metabolic Panel  Recent Labs  10/12/16 0445 10/13/16 0541  NA 141 141  K 3.5 3.6  CL 111 110  CO2 25 25  GLUCOSE 135* 136*  BUN 17 17  CREATININE 1.46* 1.38*  CALCIUM 8.7* 8.7*   Cardiac Enzymes  Recent Labs  10/11/16 1713 10/11/16 2206 10/12/16 0445  TROPONINI <0.03 <0.03 <0.03    Dg Chest 2 View  Result Date: 10/11/2016 CLINICAL DATA:  Left-sided chest pain over the last 2 weeks, worsening  today. EXAM: CHEST  2 VIEW COMPARISON:  07/18/2016 FINDINGS: The heart is enlarged. There is aortic atherosclerosis. There is chronic interstitial lung disease. Some of the interstitial markings appear more prominent and are suspicious for interstitial edema superimposed on chronic lung disease. No consolidation, collapse or effusion. No acute bone finding. IMPRESSION: Background pattern of cardiomegaly, atherosclerosis and chronic interstitial lung disease. Suspicion of superimposed mild acute interstitial edema. Electronically Signed   By: Nelson Chimes M.D.   On: 10/11/2016 13:10   Nm Myocar Multi W/spect W/wall Motion / Ef  Result Date: 10/13/2016  There was no ST segment deviation noted during stress.  The left ventricular ejection fraction is moderately decreased (30-44%).  Nuclear stress EF: 39%. The LV function has decreased compared to previous LV assessments by cath and echo.  There is no evidence of ischemia.  This is an intermediate risk study based on the reduced LV function. There is no reversible ischemia .     Disposition   Pt is being discharged home today in good condition.  Follow-up Plans & Appointments    Follow-up Information    [name], MD Follow up on 11/17/2016.   Specialty:  Cardiology Why:  8:15AM. Keep this appt for now, our scheduler will contact you to arrange an earlier visit with Dr Evette Georges PA or NP Contact information: [address] [place] [address] [place] [address] [phone]          Discharge Instructions    Diet - low sodium heart healthy    Complete by:  As directed    Increase activity slowly    Complete by:  As directed       Discharge Medications   Current Discharge Medication List    CONTINUE these medications which have CHANGED   Details  hydrALAZINE (APRESOLINE) 50 MG tablet Take 1 tablet (50 mg total) by mouth 2 (two) times daily. Qty: 60 tablet, Refills: 11    metoprolol succinate (TOPROL-XL) 100 MG 24 hr tablet  Take 1 tablet (100 mg total) by mouth daily. Qty: 90 tablet, Refills: 3      CONTINUE these medications which have NOT CHANGED   Details  acetaminophen (TYLENOL) 325 MG tablet Take 2 tablets (650 mg total) by mouth every 4 (four) hours as needed for headache or mild pain.    allopurinol (ZYLOPRIM) 300 MG tablet Take 1 tablet (300 mg total) by mouth daily. Qty: 30 tablet, Refills: 0    amLODipine (NORVASC) 10 MG tablet Take 1 tablet (10 mg total) by mouth daily. Qty: 30 tablet, Refills: 0    aspirin EC 81 MG tablet Take 81 mg by mouth daily.    insulin NPH Human (HUMULIN N,NOVOLIN N) 100 UNIT/ML injection Use 20 units in morning and 22 units at bedtime. Qty: 10 mL, Refills: 11    isosorbide mononitrate (IMDUR) 30 MG 24 hr tablet TAKE  3 TABLETS (90 MG TOTAL) BY MOUTH DAILY. Qty: 90 tablet, Refills: 6    mupirocin ointment (BACTROBAN) 2 % APPLY TO AFFECTED AREA EVERY DAY Refills: 1    nitroGLYCERIN (NITROSTAT) 0.4 MG SL tablet Place 1 tablet (0.4 mg total) under the tongue every 5 (five) minutes as needed for chest pain. Qty: 25 tablet, Refills: 3    pantoprazole (PROTONIX) 40 MG tablet Take 1 tablet (40 mg total) by mouth daily. Qty: 30 tablet, Refills: 0    rosuvastatin (CRESTOR) 40 MG tablet Take 1 tablet (40 mg total) by mouth daily at 6 PM. Qty: 30 tablet, Refills: 0    ticagrelor (BRILINTA) 90 MG TABS tablet Take 1 tablet (90 mg total) by mouth 2 (two) times daily. Qty: 60 tablet, Refills: 0    NOVOLIN R 100 UNIT/ML injection Sliding scale as directed 70-150 = 3 units 151-200 =5 units 201-250 =7 units 251-300 =9 units >300 = 12 units Refills: 5    oxyCODONE (ROXICODONE) 5 MG immediate release tablet Take 1 tablet (5 mg total) by mouth daily as needed for severe pain. Qty: 10 tablet, Refills: 0      STOP taking these medications     doxazosin (CARDURA) 8 MG tablet      doxycycline (VIBRA-TABS) 100 MG tablet            Outstanding Labs/Studies    None  Duration of Discharge Encounter   Greater than 30 minutes including physician time.  Signed, Almyra Deforest PA-C 10/13/2016, 9:10 PM

## 2016-10-13 NOTE — Discharge Instructions (Signed)

## 2016-10-13 NOTE — Progress Notes (Signed)
Patient Name: Joshua [name] Date of Encounter: 10/13/2016  Primary Cardiologist: Dr. Clearnce Hasten Problem List     Principal Problem:   Chest pain with moderate risk for cardiac etiology Active Problems:   Hyperlipidemia   Benign essential HTN   Morbid obesity due to excess calories (HCC)   CAD S/P multiple PCIs   Sleep apnea- on C-pap   CKD (chronic kidney disease) stage 3, GFR 30-59 ml/min   Chronic osteomyelitis of right foot ([place])   DM type 2 with diabetic peripheral neuropathy (HCC)     Subjective   Had an episode of sharp pain lasting 5 minutes this afternoon  Inpatient Medications    Scheduled Meds: . allopurinol  300 mg Oral Daily  . amLODipine  10 mg Oral Daily  . aspirin EC  81 mg Oral Daily  . hydrALAZINE  37.5 mg Oral BID  . insulin aspart  0-15 Units Subcutaneous TID WC  . insulin aspart  0-5 Units Subcutaneous QHS  . isosorbide mononitrate  90 mg Oral Daily  . metoprolol succinate  75 mg Oral Daily  . pantoprazole  40 mg Oral Daily  . rosuvastatin  40 mg Oral q1800  . sodium chloride flush  3 mL Intravenous Q12H  . ticagrelor  90 mg Oral BID   Continuous Infusions:   PRN Meds: sodium chloride, acetaminophen, ALPRAZolam, nitroGLYCERIN, ondansetron (ZOFRAN) IV, sodium chloride flush, traMADol, zolpidem   Vital Signs    Vitals:   10/12/16 1623 10/12/16 2100 10/13/16 0632 10/13/16 1510  BP: (!) 159/88 (!) 163/95 (!) 155/78 (!) 148/77  Pulse: 68 64 66 64  Resp: 18   18  Temp: 98.6 F (37 C) 98.6 F (37 C) 98.6 F (37 C) 98.5 F (36.9 C)  TempSrc: Oral Oral Oral Oral  SpO2: 99% 98% 94% 93%  Weight:   (!) 305 lb 4.8 oz (138.5 kg)   Height:        Intake/Output Summary (Last 24 hours) at 10/13/16 1656 Last data filed at 10/13/16 1600  Gross per 24 hour  Intake              938 ml  Output                0 ml  Net              938 ml   Filed Weights   10/11/16 1810 10/12/16 0500 10/13/16 3382  Weight: (!) 309 lb 11.2 oz (140.5 kg)  (!) 310 lb 3.2 oz (140.7 kg) (!) 305 lb 4.8 oz (138.5 kg)    Physical Exam   GEN: Well nourished, well developed, in no acute distress.  HEENT: Grossly normal.  Neck: Supple, no JVD, carotid bruits, or masses. Cardiac: RRR, no murmurs, rubs, or gallops. No clubbing, cyanosis, edema.  Radials/DP/PT 2+ and equal bilaterally.  Respiratory:  Respirations regular and unlabored, clear to auscultation bilaterally. GI: Soft, nontender, nondistended, BS + x 4. MS: no deformity or atrophy. Skin: warm and dry, no rash. Neuro:  Strength and sensation are intact. Psych: AAOx3.  Normal affect.  Labs    CBC  Recent Labs  10/11/16 1713 10/13/16 0541  WBC 6.0 5.7  HGB 10.6* 11.2*  HCT 33.5* 36.0*  MCV 84.0 85.3  PLT 218 505   Basic Metabolic Panel  Recent Labs  10/12/16 0445 10/13/16 0541  NA 141 141  K 3.5 3.6  CL 111 110  CO2 25 25  GLUCOSE 135* 136*  BUN 17 17  CREATININE 1.46* 1.38*  CALCIUM 8.7* 8.7*     Recent Labs  10/11/16 1713 10/11/16 2206 10/12/16 0445  TROPONINI <0.03 <0.03 <0.03     Telemetry     NSR- Personally Reviewed  ECG     NSR with 1st degree AVB- Personally Reviewed  Radiology    No results found.  Cardiac Studies   07/29/16  Coronary Stent Intervention  Intravascular Ultrasound/IVUS   Mid RCA lesion, 50 %stenosed.  Mid RCA to Dist RCA lesion, 10 %stenosed.  RPDA lesion, 30 %stenosed.  Mid Cx lesion, 75 %stenosed.  3rd Mrg lesion, 30 %stenosed.  Ost 1st Diag to 1st Diag lesion, 40 %stenosed.  Dist LAD lesion, 10 %stenosed.  Ost LAD to Prox LAD lesion, 85 %stenosed.  A STENT SYNERGY DES 3.5X20 drug eluting stent was successfully placed, and overlaps previously placed stent.  Prox LAD to Mid LAD lesion, 85 %stenosed.  Post intervention, there is a 0% residual stenosis.  A STENT SYNERGY DES [id number] drug eluting stent was successfully placed, and does not overlap previously placed stent.  2nd Mrg lesion, 95  %stenosed.  Post intervention, there is a 0% residual stenosis.    Successful stenting of proximal 85% LAD stenosis with a Synergy 3.5 x 20 drug-eluting stent from the LAD ostium to the proximal vessel overlapping previously placed stents. A 0% stenosis is noted post implantation. Final postdilatation diameter of 3.75 mm.  Successful stenting of the second obtuse marginal from 90% to 0% using a Synergy 2.75 x 16 mm DES deployed at 14 atm. Postdilatation was not performed.  Total contrast 95 cc  RECOMMENDATIONS:  IV fluids at 75 cc by our for 18 hours. Basic metabolic panel in a.m.   Patient Profile     Joshua [name] is a 60 year old male with a past medical history of CAD, s/p prior stenting of the RCA and LAD in 2012, and staged PCI with a DES to the LAD and OM2 on 07/29/16, (5 days s/p Rt MT amputation for osteomyelitis). Other history includes hypertension, hyperlipidemia, diabetes with neuropathy, CRI, GERD with gastroparesis and also mild osteomyelitis status post prior toe amputations on the right in 2010 and 2012.    Presented with left sided chest pain x 2 weeks. For stress testing today.   Assessment & Plan   1. Chest pain :  With recent staged PCI with DES to the LAD and OM2 in August of this year. Will continue ASA and Brilinta.  Chest pain with musculoskelal features; improved with iv toradol.  2 day protocol nuclear study done; await final report. Pt on a good antianginal regimen.  Had an episode of sharp pain this afternoon; will check  ECG.  Chest wall tenderness is improved.      2. DM: On sliding scale, last a1c was 9.5.   3. Chronic renal insufficiency: stable.   4.Morbid obesity  BMI 45; discussed importance of weight loss.  5. S/p metatarsal amputation  Await nuclear results.    [name], MD, [facility] 10/13/2016 4:56 PM

## 2016-10-13 NOTE — Progress Notes (Signed)
Patient ready for discharge. Discharge instructions explained to patient thoroughly, patient verbalizes understanding. IV removed with no difficulties.

## 2016-10-15 ENCOUNTER — Telehealth: Payer: Self-pay | Admitting: Cardiovascular Disease

## 2016-10-15 NOTE — Telephone Encounter (Signed)
Closed encounter °

## 2016-10-19 ENCOUNTER — Encounter: Payer: Self-pay | Admitting: Physician Assistant

## 2016-10-20 ENCOUNTER — Ambulatory Visit: Payer: Self-pay | Admitting: Infectious Disease

## 2016-10-22 ENCOUNTER — Encounter: Payer: Self-pay | Admitting: Physician Assistant

## 2016-10-22 ENCOUNTER — Ambulatory Visit (INDEPENDENT_AMBULATORY_CARE_PROVIDER_SITE_OTHER): Payer: BLUE CROSS/BLUE SHIELD | Admitting: Physician Assistant

## 2016-10-22 VITALS — BP 145/80 | HR 79 | Ht 69.0 in | Wt 307.0 lb

## 2016-10-22 DIAGNOSIS — I10 Essential (primary) hypertension: Secondary | ICD-10-CM | POA: Diagnosis not present

## 2016-10-22 DIAGNOSIS — E785 Hyperlipidemia, unspecified: Secondary | ICD-10-CM

## 2016-10-22 DIAGNOSIS — I2510 Atherosclerotic heart disease of native coronary artery without angina pectoris: Secondary | ICD-10-CM | POA: Diagnosis not present

## 2016-10-22 NOTE — Patient Instructions (Signed)
Medication Instructions:  Your physician recommends that you continue on your current medications as directed. Please refer to the Current Medication list given to you today.  Labwork: NONE   Testing/Procedures: NONE  Follow-Up: Your physician recommends that you schedule a follow-up appointment in: KEEP UPCOMING APPT WITH DR Claiborne Billings AS SCHEDULED  Any Other Special Instructions Will Be Listed Below (If Applicable).     If you need a refill on your cardiac medications before your next appointment, please call your pharmacy.

## 2016-10-22 NOTE — Progress Notes (Signed)
Cardiology Office Note    Date:  10/22/2016   ID:  Joshua [name], DOB [birth year], MRN [id number]  PCP:  [name], MD  Cardiologist:  Dr. Claiborne Billings  Chief Complaint  Patient presents with  . Follow-up    Seen for Dr. Claiborne Billings, post hospital for chest pain    History of Present Illness:  Joshua [name] is a 60 y.o. male with PMH of CAD s/p Prior stenting of RCA and LAD in 2012, stage PCI with DES to LAD and OM 2 on 07/29/2016, recent right metatarsal amputation for osteomyelitis 07/24/2016, hypertension, hyperlipidemia, diabetes with neuropathy, chronic renal insufficiency, GERD, gastroparesis. Previously, he underwent toe amputation on the right in 2010 and 2012. He was admitted on 07/18/2016 for unstable angina, he was ruled in NSTEMI. He underwent cardiac catheterization on 07/19/2016 which showed new eccentric proximal 80% LAD not involving the previously placed overlapping mid LAD stents, eccentric 75% mid left circumflex lesion between OM1 and OM 2, progression of OM1 from 50% obstruction to 95% obstruction. He was evaluated by surgery and eventually opted for PCI. His hospital course was complicated by osteomyelitis involving the right great toe he underwent right transmetatarsal amputation. He was also seen by infectious disease and finished a course of IV antibiotic. After adequate antibiotic therapy and amputation, he returned to the cath lab on 07/29/2016 and underwent successful stenting of proximal 85% LAD stenosis with Synergy 3.5 x 20 mm DES from LAD ostium to the proximal vessel overlapping previously placed stent, he also underwent successful stenting of OM 2 with Synergy 2.75 x 16 mm DES.   He was last seen in the office on 08/31/2016 at which time she was doing well. Most recently he was admitted in October 2017 for chest pain, his chest pain was felt to be atypical with musculoskeletal features. 2 day protocol nuclear stress test was done which showed EF 39%, no evidence of  ischemia. His ejection fraction was normal on the previous cardiac catheterization on 07/19/2016. He was discharge from cardiology perspective. He presents today for cardiology office visit, his chest discomfort has significantly improved. He denies any shortness of breath. There is no lower extremity edema on physical exam. He has been compliant with his aspirin and Brilinta.    Past Medical History:  Diagnosis Date  . Chronic combined systolic and diastolic CHF (congestive heart failure) ([place])    a. 07/2015 Echo: EF 45-50%, mod LVH, mid apical/antsept AK, Gr 1 DD.  [name] CKD (chronic kidney disease), stage III   . Coronary artery disease    a. 2012 s/p MI/PCI: s/p DES to mid/dist RCA and overlapping DES to LAD;  b. 07/2015 Cath: LAD 10% ISR, D1 40(jailed), LCX 73m OM2 30, OM3 30, RCA 556mSR, 10d ISR; c. 07/2016 NSTEMI/PCI: LAD 85ost/p/m (3.5x20 Synergy DES overlapping prior stent), D1 40, LCX 7529mM2 95 (2.75x16 Synergy DES), OM3 30, RCA 64m32md.  . Depression   . Diabetic gastroparesis (HCC)[place]. GERD (gastroesophageal reflux disease)   . Gout   . Gout   . Heart murmur   . Hyperlipemia   . Hypertensive heart disease   . Ischemic cardiomyopathy    a. 07/2015 Echo: EF 45-50%, mod LVH, mid-apicalanteroseptal AK, Gr1 DD.  . Morbid obesity (HCC)[place] [address]. Myocardial infarction 2012  . OSA on CPAP   . Osteomyelitis (HCC)[place] a. 07/2016 MTP joint of R great toe s/p transmetatarsal amputation.  . Polyneuropathy in diabetes(357.2)   .  Pulmonary sarcoidosis ([place])    "problems w/it years ago; no problems anymore" (07/03/2014)  . Type II diabetes mellitus ([place])     Past Surgical History:  Procedure Laterality Date  . AMPUTATION Right 07/24/2016   Procedure: TRANSMETATARSAL FOOT AMPUTATION;  Surgeon: [name], MD;  Location: [facility];  Service: Orthopedics;  Laterality: Right;  . BREAST LUMPECTOMY Right ~ 2000   "benign"  . CARDIAC CATHETERIZATION N/A 07/30/2015   Procedure: Left Heart Cath and Coronary  Angiography;  Surgeon: Burnell Blanks, MD;  Location: [facility];  Service: Cardiovascular;  Laterality: N/A;  . CARDIAC CATHETERIZATION N/A 07/19/2016   Procedure: Left Heart Cath and Coronary Angiography;  Surgeon: Belva Crome, MD;  Location: [facility];  Service: Cardiovascular;  Laterality: N/A;  . CARDIAC CATHETERIZATION N/A 07/29/2016   Procedure: Coronary Stent Intervention;  Surgeon: Belva Crome, MD;  Location: [facility];  Service: Cardiovascular;  Laterality: N/A;  . CARDIAC CATHETERIZATION N/A 07/29/2016   Procedure: Intravascular Ultrasound/IVUS;  Surgeon: Belva Crome, MD;  Location: [facility];  Service: Cardiovascular;  Laterality: N/A;  . CATARACT EXTRACTION W/ INTRAOCULAR LENS  IMPLANT, BILATERAL Bilateral 2014-2015  . CORONARY ANGIOPLASTY WITH STENT PLACEMENT  10/31/2011   30 % MID ATRIOVENTRICULAR GROOVE CX STENOSIS. 90 % MID RCA.PTA TO LAD/STENTING OF TANDEM 60-70%. LAD STENOSIS 99% REDUCED TO 0%.3.0 X 32MM PROMUS DES POSTDILATED TO 3.25MM.  [name] CORONARY ANGIOPLASTY WITH STENT PLACEMENT  07/03/2014   "2"  . I&D EXTREMITY Right 10/30/2013   Procedure: IRRIGATION AND DEBRIDEMENT RIGHT SMALL FINGER POSSIBLE SECONDARY WOUND CLOSURE;  Surgeon: [name], MD;  Location: [facility];  Service: Orthopedics;  Laterality: Right;  [name] is available after 4pm  . I&D EXTREMITY Right 11/01/2013   Procedure:  IRRIGATION AND DEBRIDEMENT RIGHT  PROXIMAL PHALANGEAL LEVEL AMPUTATION;  Surgeon: [name], MD;  Location: [facility];  Service: Orthopedics;  Laterality: Right;  . INCISION AND DRAINAGE Right 10/28/2013   Procedure: INCISION AND DRAINAGE Right small finger;  Surgeon: [name], MD;  Location: [facility];  Service: Orthopedics;  Laterality: Right;  . LEFT HEART CATH Left 11/03/2011   Procedure: LEFT HEART CATH;  Surgeon: [name], MD;  Location: [facility];  Service: Cardiovascular;  Laterality: Left;  . LEFT HEART  CATHETERIZATION WITH CORONARY ANGIOGRAM N/A 07/03/2014   Procedure: LEFT HEART CATHETERIZATION WITH CORONARY ANGIOGRAM;  Surgeon: [name], MD;  Location: [facility] C[facility];  Service: Cardiovascular;  Laterality: N/A;  . PERCUTANEOUS CORONARY STENT INTERVENTION (PCI-S) Left 11/03/2011   Procedure: PERCUTANEOUS CORONARY STENT INTERVENTION (PCI-S);  Surgeon: [name], MD;  Location[facility] C[facility] LAB;  Service: Cardiovascular;  Laterality: Left;  . TOE AMPUTATION Right ~ 2010   "2nd toe"  . TOE AMPUTATION Right 2012   "3rd toe"    Current Medications: Outpatient Medications Prior to Visit  Medication Sig Dispense Refill  . acetaminophen (TYLENOL) 325 MG tablet Take 2 tablets (650 mg total) by mouth every 4 (four) hours as needed for headache or mild pain.    [name] allopurinol (ZYLOPRIM) 300 MG tablet Take 1 tablet (300 mg total) by mouth daily. 30 tablet 0  . amLODipine (NORVASC) 10 MG tablet Take 1 tablet (10 mg total) by mouth daily. 30 tablet 0  . aspirin EC 81 MG tablet Take 81 mg by mouth daily.    . hydrALAZINE (APRESOLINE) 50 MG tablet Take 1 tablet (50 mg total) by  mouth 2 (two) times daily. 60 tablet 11  . insulin NPH Human (HUMULIN N,NOVOLIN N) 100 UNIT/ML injection Use 20 units in morning and 22 units at bedtime. (Patient taking differently: Inject 20-25 Units into the skin 2 (two) times daily. Use 25 units in morning and 20 units at bedtime.) 10 mL 11  . isosorbide mononitrate (IMDUR) 30 MG 24 hr tablet TAKE 3 TABLETS (90 MG TOTAL) BY MOUTH DAILY. 90 tablet 6  . metoprolol succinate (TOPROL-XL) 100 MG 24 hr tablet Take 1 tablet (100 mg total) by mouth daily. 90 tablet 3  . mupirocin ointment (BACTROBAN) 2 % APPLY TO AFFECTED AREA EVERY DAY  1  . nitroGLYCERIN (NITROSTAT) 0.4 MG SL tablet Place 1 tablet (0.4 mg total) under the tongue every 5 (five) minutes as needed for chest pain. 25 tablet 3  . NOVOLIN R 100 UNIT/ML injection Sliding scale as directed 70-150 = 3 units 151-200 =5  units 201-250 =7 units 251-300 =9 units >300 = 12 units  5  . oxyCODONE (ROXICODONE) 5 MG immediate release tablet Take 1 tablet (5 mg total) by mouth daily as needed for severe pain. 10 tablet 0  . pantoprazole (PROTONIX) 40 MG tablet Take 1 tablet (40 mg total) by mouth daily. 30 tablet 0  . rosuvastatin (CRESTOR) 40 MG tablet Take 1 tablet (40 mg total) by mouth daily at 6 PM. 30 tablet 0  . ticagrelor (BRILINTA) 90 MG TABS tablet Take 1 tablet (90 mg total) by mouth 2 (two) times daily. 60 tablet 0   No facility-administered medications prior to visit.      Allergies:   Patient has no known allergies.   Social History   Social History  . Marital status: Married    Spouse name: N/A  . Number of children: N/A  . Years of education: N/A   Social History Main Topics  . Smoking status: Never Smoker  . Smokeless tobacco: Never Used  . Alcohol use Yes     Comment: Pt sts he binge drinks on the weekends  . Drug use: No  . Sexual activity: Yes   Other Topics Concern  . None   Social History Narrative  . None     Family History:  The patient's family history includes Diabetes in his maternal grandmother; Heart attack in his maternal aunt; Hypertension in his maternal grandmother.   ROS:   Please see the history of present illness.    ROS All other systems reviewed and are negative.   PHYSICAL EXAM:   VS:  BP (!) 145/80   Pulse 79   Ht '5\' 9"'  (1.753 m)   Wt (!) 307 lb (139.3 kg)   BMI 45.34 kg/m    GEN: Well nourished, well developed, in no acute distress  HEENT: normal  Neck: no JVD, carotid bruits, or masses Cardiac: RRR; no murmurs, rubs, or gallops,no edema  Respiratory:  clear to auscultation bilaterally, normal work of breathing GI: soft, nontender, nondistended, + BS MS: no deformity or atrophy  Skin: warm and dry, no rash Neuro:  Alert and Oriented x 3, Strength and sensation are intact Psych: euthymic mood, full affect  Wt Readings from Last 3  Encounters:  10/22/16 (!) 307 lb (139.3 kg)  10/13/16 (!) 305 lb 4.8 oz (138.5 kg)  10/11/16 290 lb (131.5 kg)      Studies/Labs Reviewed:   EKG:  EKG is not ordered today.    Recent Labs: 07/18/2016: TSH 3.175 08/04/2016: ALT 16 10/11/2016: B  Natriuretic Peptide 224.5 10/13/2016: BUN 17; Creatinine, Ser 1.38; Hemoglobin 11.2; Platelets 205; Potassium 3.6; Sodium 141   Lipid Panel    Component Value Date/Time   CHOL 205 (H) 02/16/2016 0959   TRIG 101 02/16/2016 0959   HDL 102 02/16/2016 0959   CHOLHDL 2.0 02/16/2016 0959   VLDL 20 02/16/2016 0959   LDLCALC 83 02/16/2016 0959    Additional studies/ records that were reviewed today include:   Echo 07/31/2015 LV EF: 45% -   50%  ------------------------------------------------------------------- Study Conclusions  - Left ventricle: The cavity size was normal. Wall thickness was   increased in a pattern of moderate LVH. Systolic function was   mildly reduced. The estimated ejection fraction was in the range   of 45% to 50%. There is akinesis of the mid-apicalanteroseptal   myocardium. Doppler parameters are consistent with abnormal left   ventricular relaxation (grade 1 diastolic dysfunction).   Cath 07/19/2016 Conclusion     Mid RCA lesion, 50 %stenosed.  Mid RCA to Dist RCA lesion, 10 %stenosed.  RPDA lesion, 30 %stenosed.  3rd Mrg lesion, 30 %stenosed.  Prox LAD to Mid LAD lesion, 10 %stenosed.  Ost 1st Diag to 1st Diag lesion, 40 %stenosed.  Dist LAD lesion, 10 %stenosed.  Mid Cx lesion, 75 %stenosed.  Ost LAD to Prox LAD lesion, 85 %stenosed.  2nd Mrg lesion, 95 %stenosed.  The left ventricular ejection fraction is 50-55% by visual estimate.  The left ventricular systolic function is normal.  LV end diastolic pressure is mildly elevated.    New eccentric proximal 80% LAD not involving the previously placed overlapping mid LAD stents. The vessel distal to the stents is widely  patent.  Eccentric 75% mid circumflex stenosis between OM1 and OM 2.  Progression of obtuse marginal #1 from 50% obstruction to 95% obstruction (culprit vessel for presentation).  Mild to moderate diffuse mid RCA disease and diffuse 40-50% narrowing within the previously placed distal RCA stent.  Left ventricular diastolic dysfunction with EF 50% an EDP 19 mmHg.  Acute on chronic kidney injury with creatinine 1.45 today up from 1.34 on admission.  Recommendation:   [facility]ion PCI versus elective CABG in this diabetic with multiple prior stents and questionable compliance with dual antiplatelet therapy. Discussed with Dr. Gwenlyn Found. I have not contacted [facility].  IV hydration; check kidney function in a.m; hold ARB therapy.  If PCI chosen, I could perform the procedure on Wednesday or Thursday morning.     Cath 07/29/2016 Conclusion     Mid RCA lesion, 50 %stenosed.  Mid RCA to Dist RCA lesion, 10 %stenosed.  RPDA lesion, 30 %stenosed.  Mid Cx lesion, 75 %stenosed.  3rd Mrg lesion, 30 %stenosed.  Ost 1st Diag to 1st Diag lesion, 40 %stenosed.  Dist LAD lesion, 10 %stenosed.  Ost LAD to Prox LAD lesion, 85 %stenosed.  A STENT SYNERGY DES 3.5X20 drug eluting stent was successfully placed, and overlaps previously placed stent.  Prox LAD to Mid LAD lesion, 85 %stenosed.  Post intervention, there is a 0% residual stenosis.  A STENT SYNERGY DES [id number] drug eluting stent was successfully placed, and does not overlap previously placed stent.  2nd Mrg lesion, 95 %stenosed.  Post intervention, there is a 0% residual stenosis.    Successful stenting of proximal 85% LAD stenosis with a Synergy 3.5 x 20 drug-eluting stent from the LAD ostium to the proximal vessel overlapping previously placed stents. A 0% stenosis is noted post implantation. Final postdilatation diameter of 3.75 mm.  Successful stenting of the second obtuse marginal from 90% to 0% using a Synergy 2.75 x 16  mm DES deployed at 14 atm. Postdilatation was not performed.  Total contrast 95 cc  RECOMMENDATIONS:  IV fluids at 75 cc by our for 18 hours. Basic metabolic panel in a.m.     Myoview 10/13/2016  There was no ST segment deviation noted during stress.  The left ventricular ejection fraction is moderately decreased (30-44%).  Nuclear stress EF: 39%. The LV function has decreased compared to previous LV assessments by cath and echo.  There is no evidence of ischemia.  This is an intermediate risk study based on the reduced LV function. There is no reversible ischemia .    ASSESSMENT:    1. Coronary artery disease involving native coronary artery of native heart without angina pectoris   2. Essential hypertension   3. Hyperlipidemia, unspecified hyperlipidemia type      PLAN:  In order of problems listed above:  1. CAD: Compliant with aspirin and Brilinta, recently admitted to the hospital with new chest pain, 2 day Myoview was obtained which came back negative for ischemia. His chest discomfort has significantly improved. Would not pursue any further ischemic workup at this time.  2. HTN: Blood pressure is mildly elevated today, however his last blood pressure was normal. I'm less inclined to increase medications this point. I have advised him to keep a blood pressure diary, if his blood pressure still high on follow-up, we can potentially increase his hydralazine to 3 times a day dosing.  3. HLD: On Crestor 40 mg daily.    Medication Adjustments/Labs and Tests Ordered: Current medicines are reviewed at length with the patient today.  Concerns regarding medicines are outlined above.  Medication changes, Labs and Tests ordered today are listed in the Patient Instructions below. Patient Instructions  Medication Instructions:  Your physician recommends that you continue on your current medications as directed. Please refer to the Current Medication list given to you  today.  Labwork: NONE   Testing/Procedures: NONE  Follow-Up: Your physician recommends that you schedule a follow-up appointment in: KEEP UPCOMING APPT WITH DR Claiborne Billings AS SCHEDULED  Any Other Special Instructions Will Be Listed Below (If Applicable).     If you need a refill on your cardiac medications before your next appointment, please call your pharmacy.     Hilbert Corrigan, [place]  10/22/2016 10:55 PM    [facility] [address], [place], [place]  [address] Phone: [phone]; Fax: [phone]

## 2016-11-01 ENCOUNTER — Encounter: Payer: Self-pay | Admitting: Infectious Disease

## 2016-11-01 ENCOUNTER — Ambulatory Visit (INDEPENDENT_AMBULATORY_CARE_PROVIDER_SITE_OTHER): Payer: BLUE CROSS/BLUE SHIELD | Admitting: Infectious Disease

## 2016-11-01 VITALS — BP 174/97 | HR 74 | Temp 98.3°F | Ht 69.0 in | Wt 308.0 lb

## 2016-11-01 DIAGNOSIS — M86671 Other chronic osteomyelitis, right ankle and foot: Secondary | ICD-10-CM | POA: Diagnosis not present

## 2016-11-01 DIAGNOSIS — N183 Chronic kidney disease, stage 3 unspecified: Secondary | ICD-10-CM

## 2016-11-01 DIAGNOSIS — E1142 Type 2 diabetes mellitus with diabetic polyneuropathy: Secondary | ICD-10-CM

## 2016-11-01 LAB — BASIC METABOLIC PANEL WITH GFR
BUN: 24 mg/dL (ref 7–25)
CO2: 26 mmol/L (ref 20–31)
Calcium: 8.8 mg/dL (ref 8.6–10.3)
Chloride: 108 mmol/L (ref 98–110)
Creat: 1.5 mg/dL — ABNORMAL HIGH (ref 0.70–1.33)
GFR, Est African American: 58 mL/min — ABNORMAL LOW (ref >=60)
GFR, Est Non African American: 51 mL/min — ABNORMAL LOW (ref >=60)
Glucose, Bld: 132 mg/dL — ABNORMAL HIGH (ref 65–99)
Potassium: 3.8 mmol/L (ref 3.5–5.3)
Sodium: 141 mmol/L (ref 135–146)

## 2016-11-01 NOTE — Progress Notes (Signed)
Subjective:   Chief complaint: Follow-up for diabetic foot infection osteomyelitis status post amputation   Patient ID: Joshua [name], male    DOB: [birth year], 58 y.o.   MRN: [id number]  HPI  60 y.o. male with  unstable angina with 3 V disease in need of PCI, with CKD, DM that is poorly controlled and with neuropathy, hx of prior amputations of 2 digits on same foot now with osteomyelitis in distal and possibly proximal phalanx of great toe and foot abscess now sp TMT amputation by Dr. Sharol Given. No intraoperative cultures were obtained so we made do with empiric "mop up" IV antibiotics with 3 weeks of postoperative Teflaro.  He was seen by Dr. Jess Barters PA in followup who was disturbed by opening of the wound and rx doxycycline. He finished doxycycline roughly a month ago and his wound has been doing well. He had been followed closely by Dr. Sharol Given. The interim he was admitted for chest pain is being worked up by cardiology. Has no fevers or other systemic symptoms to suggest systemic infection.    Past Medical History:  Diagnosis Date  . Chronic combined systolic and diastolic CHF (congestive heart failure) ([place])    a. 07/2015 Echo: EF 45-50%, mod LVH, mid apical/antsept AK, Gr 1 DD.  [name] CKD (chronic kidney disease), stage III   . Coronary artery disease    a. 2012 s/p MI/PCI: s/p DES to mid/dist RCA and overlapping DES to LAD;  b. 07/2015 Cath: LAD 10% ISR, D1 40(jailed), LCX 67m, OM2 30, OM3 30, RCA 77m ISR, 10d ISR; c. 07/2016 NSTEMI/PCI: LAD 85ost/p/m (3.5x20 Synergy DES overlapping prior stent), D1 40, LCX 35m, OM2 95 (2.75x16 Synergy DES), OM3 30, RCA 20m, 10d.  . Depression   . Diabetic gastroparesis ([place])   . GERD (gastroesophageal reflux disease)   . Gout   . Gout   . Heart murmur   . Hyperlipemia   . Hypertensive heart disease   . Ischemic cardiomyopathy    a. 07/2015 Echo: EF 45-50%, mod LVH, mid-apicalanteroseptal AK, Gr1 DD.  . Morbid obesity ([place])   . Myocardial infarction 2012    . OSA on CPAP   . Osteomyelitis ([place])    a. 07/2016 MTP joint of R great toe s/p transmetatarsal amputation.  . Polyneuropathy in diabetes(357.2)   . Pulmonary sarcoidosis ([place])    "problems w/it years ago; no problems anymore" (07/03/2014)  . Type II diabetes mellitus ([place])     Past Surgical History:  Procedure Laterality Date  . AMPUTATION Right 07/24/2016   Procedure: TRANSMETATARSAL FOOT AMPUTATION;  Surgeon: [name], MD;  Location: [facility];  Service: Orthopedics;  Laterality: Right;  . BREAST LUMPECTOMY Right ~ 2000   "benign"  . CARDIAC CATHETERIZATION N/A 07/30/2015   Procedure: Left Heart Cath and Coronary Angiography;  Surgeon: Burnell Blanks, MD;  Location: [facility];  Service: Cardiovascular;  Laterality: N/A;  . CARDIAC CATHETERIZATION N/A 07/19/2016   Procedure: Left Heart Cath and Coronary Angiography;  Surgeon: Belva Crome, MD;  Location: [facility];  Service: Cardiovascular;  Laterality: N/A;  . CARDIAC CATHETERIZATION N/A 07/29/2016   Procedure: Coronary Stent Intervention;  Surgeon: Belva Crome, MD;  Location: [facility];  Service: Cardiovascular;  Laterality: N/A;  . CARDIAC CATHETERIZATION N/A 07/29/2016   Procedure: Intravascular Ultrasound/IVUS;  Surgeon: Belva Crome, MD;  Location: [facility];  Service: Cardiovascular;  Laterality: N/A;  . CATARACT EXTRACTION W/ INTRAOCULAR LENS  IMPLANT, BILATERAL Bilateral 2014-2015  . CORONARY ANGIOPLASTY WITH STENT PLACEMENT  10/31/2011   30 % MID ATRIOVENTRICULAR GROOVE CX STENOSIS. 90 % MID RCA.PTA TO LAD/STENTING OF TANDEM 60-70%. LAD STENOSIS 99% REDUCED TO 0%.3.0 X 32MM PROMUS DES POSTDILATED TO 3.25MM.  [name] CORONARY ANGIOPLASTY WITH STENT PLACEMENT  07/03/2014   "2"  . I&D EXTREMITY Right 10/30/2013   Procedure: IRRIGATION AND DEBRIDEMENT RIGHT SMALL FINGER POSSIBLE SECONDARY WOUND CLOSURE;  Surgeon: [name], MD;  [facility];  Service: Orthopedics;  Laterality:  Right;  [name] is available after 4pm  . I&D EXTREMITY Right 11/01/2013   Procedure:  IRRIGATION AND DEBRIDEMENT RIGHT  PROXIMAL PHALANGEAL LEVEL AMPUTATION;  Surgeon: [name], MD;  [facility];  Service: Orthopedics;  Laterality: Right;  . INCISION AND DRAINAGE Right 10/28/2013   Procedure: INCISION AND DRAINAGE Right small finger;  Surgeon: [name], MD;  [facility];  Service: Orthopedics;  Laterality: Right;  . LEFT HEART CATH Left 11/03/2011   Procedure: LEFT HEART CATH;  Surgeon: [name], MD;  Location: [facility] CATH [facility];  Service: Cardiovascular;  Laterality: Left;  . LEFT HEART CATHETERIZATION WITH CORONARY ANGIOGRAM N/A 07/03/2014   Procedure: LEFT HEART CATHETERIZATION WITH CORONARY ANGIOGRAM;  Surgeon: [name], MD;  Location[facility] C[facility];  Service: Cardiovascular;  Laterality: N/A;  . PERCUTANEOUS CORONARY STENT INTERVENTION (PCI-S) Left 11/03/2011   Procedure: PERCUTANEOUS CORONARY STENT INTERVENTION (PCI-S);  Surgeon: [name], MD;  Location: [facility] CATH LAB;  Service: Cardiovascular;  Laterality: Left;  . TOE AMPUTATION Right ~ 2010   "2nd toe"  . TOE AMPUTATION Right 2012   "3rd toe"    Family History  Problem Relation Age of Onset  . Heart attack Maternal Aunt   . Diabetes Maternal Grandmother   . Hypertension Maternal Grandmother       Social History   Social History  . Marital status: Married    Spouse name: N/A  . Number of children: N/A  . Years of education: N/A   Social History Main Topics  . Smoking status: Never Smoker  . Smokeless tobacco: Never Used  . Alcohol use No     Comment: none since 07/2016  . Drug use: No  . Sexual activity: Yes   Other Topics Concern  . Not on file   Social History Narrative  . No narrative on file    No Known Allergies   Current Outpatient Prescriptions:  .  acetaminophen (TYLENOL) 325 MG tablet, Take 2 tablets (650 mg total) by mouth every 4 (four) hours as needed for  headache or mild pain., Disp: , Rfl:  .  allopurinol (ZYLOPRIM) 300 MG tablet, Take 1 tablet (300 mg total) by mouth daily., Disp: 30 tablet, Rfl: 0 .  amLODipine (NORVASC) 10 MG tablet, Take 1 tablet (10 mg total) by mouth daily., Disp: 30 tablet, Rfl: 0 .  aspirin EC 81 MG tablet, Take 81 mg by mouth daily., Disp: , Rfl:  .  hydrALAZINE (APRESOLINE) 50 MG tablet, Take 1 tablet (50 mg total) by mouth 2 (two) times daily., Disp: 60 tablet, Rfl: 11 .  insulin NPH Human (HUMULIN N,NOVOLIN N) 100 UNIT/ML injection, Use 20 units in morning and 22 units at bedtime. (Patient taking differently: Inject 20-25 Units into the skin 2 (two) times daily. Use 25 units in morning and 20 units at bedtime.), Disp: 10 mL, Rfl: 11 .  isosorbide mononitrate (IMDUR) 30 MG 24 hr tablet, TAKE 3  TABLETS (90 MG TOTAL) BY MOUTH DAILY., Disp: 90 tablet, Rfl: 6 .  metoprolol succinate (TOPROL-XL) 100 MG 24 hr tablet, Take 1 tablet (100 mg total) by mouth daily., Disp: 90 tablet, Rfl: 3 .  mupirocin ointment (BACTROBAN) 2 %, APPLY TO AFFECTED AREA EVERY DAY, Disp: , Rfl: 1 .  nitroGLYCERIN (NITROSTAT) 0.4 MG SL tablet, Place 1 tablet (0.4 mg total) under the tongue every 5 (five) minutes as needed for chest pain., Disp: 25 tablet, Rfl: 3 .  NOVOLIN R 100 UNIT/ML injection, Sliding scale as directed 70-150 = 3 units 151-200 =5 units 201-250 =7 units 251-300 =9 units >300 = 12 units, Disp: , Rfl: 5 .  pantoprazole (PROTONIX) 40 MG tablet, Take 1 tablet (40 mg total) by mouth daily., Disp: 30 tablet, Rfl: 0 .  rosuvastatin (CRESTOR) 40 MG tablet, Take 1 tablet (40 mg total) by mouth daily at 6 PM., Disp: 30 tablet, Rfl: 0 .  ticagrelor (BRILINTA) 90 MG TABS tablet, Take 1 tablet (90 mg total) by mouth 2 (two) times daily., Disp: 60 tablet, Rfl: 0   Review of Systems  Constitutional: Negative for chills, diaphoresis and fever.  HENT: Negative for congestion, hearing loss, sore throat and tinnitus.   Respiratory: Negative for  cough, shortness of breath and wheezing.   Cardiovascular: Negative for chest pain, palpitations and leg swelling.  Gastrointestinal: Negative for abdominal pain, blood in stool, constipation, diarrhea, nausea and vomiting.  Genitourinary: Negative for dysuria, flank pain and hematuria.  Musculoskeletal: Positive for myalgias. Negative for back pain.  Skin: Positive for wound. Negative for rash.  Neurological: Negative for dizziness, weakness and headaches.  Hematological: Does not bruise/bleed easily.  Psychiatric/Behavioral: Negative for suicidal ideas. The patient is not nervous/anxious.        Objective:   Physical Exam  Constitutional: He is oriented to person, place, and time. He appears well-developed and well-nourished.  HENT:  Head: Normocephalic and atraumatic.  Eyes: Conjunctivae and EOM are normal.  Neck: Normal range of motion. Neck supple.  Cardiovascular: Normal rate and regular rhythm.   Pulmonary/Chest: Effort normal. No respiratory distress. He has no wheezes.  Abdominal: Soft. He exhibits no distension.  Musculoskeletal: Normal range of motion. He exhibits no edema or tenderness.  Neurological: He is alert and oriented to person, place, and time.  Skin: Skin is warm and dry. No rash noted. No erythema.  Psychiatric: He has a normal mood and affect. His behavior is normal. Judgment and thought content normal.    Right foot amputation site wound 09/08/16:     11/03/2016:        Opposite foot without ulcers      Assessment & Plan:   Amputation site infection: .Patient is doing well and wound is healing up. There is no evidence of recurrent infection at this point time I will check inflammatory markers today for reassurance purposes. Provided they are also reassuring as the patient's clinical history is he can follow-up as needed.  Chest pain and coronary disease being worked up by cardiology.  Diabetes mellitus with diabetic foot ulcer: Being followed  by primary care

## 2016-11-02 LAB — SEDIMENTATION RATE: Sed Rate: 27 mm/hr — ABNORMAL HIGH (ref 0–20)

## 2016-11-02 LAB — C-REACTIVE PROTEIN: CRP: 4.8 mg/L (ref <8.0)

## 2016-11-08 ENCOUNTER — Ambulatory Visit (INDEPENDENT_AMBULATORY_CARE_PROVIDER_SITE_OTHER): Payer: BLUE CROSS/BLUE SHIELD | Admitting: Orthopedic Surgery

## 2016-11-08 ENCOUNTER — Encounter (INDEPENDENT_AMBULATORY_CARE_PROVIDER_SITE_OTHER): Payer: Self-pay | Admitting: Orthopedic Surgery

## 2016-11-08 VITALS — Ht 69.0 in | Wt 308.0 lb

## 2016-11-08 DIAGNOSIS — E1142 Type 2 diabetes mellitus with diabetic polyneuropathy: Secondary | ICD-10-CM | POA: Diagnosis not present

## 2016-11-08 DIAGNOSIS — Z89431 Acquired absence of right foot: Secondary | ICD-10-CM | POA: Diagnosis not present

## 2016-11-08 NOTE — Progress Notes (Signed)
Office Visit Note   Patient: Joshua [name]           Date of Birth: 1956-06-06           MRN: [id number] Visit Date: 11/08/2016              Requested by: [name], MD [address], [place] [address] PCP: [name], MD   Assessment & Plan: Visit Diagnoses:  1. Status post transmetatarsal amputation of foot, right ([place])   2. Diabetic polyneuropathy associated with type 2 diabetes mellitus ([place])     Plan: Patient is ambulating well. Patient is given a prescription for biotech 4 custom orthotic spacer and carpal plate for his right foot. Follow-up as needed.  Follow-Up Instructions: Return if symptoms worsen or fail to improve.   Orders:  No orders of the defined types were placed in this encounter.  No orders of the defined types were placed in this encounter.     Procedures: No procedures performed   Clinical Data: No additional findings.   Subjective: Chief Complaint  Patient presents with  . Right Foot - Follow-up    Patient returns today for a 4 week recheck of his right foot. Patient had right transmetatarsal foot amputation done on 07/24/2016. Patient states everything is going good. Healed up, no openings. No pain    Review of Systems   Objective: Vital Signs: Ht $RemoveB'5\' 9"'KBvXyLdm$  (1.753 m)   Wt (!) 308 lb (139.7 kg)   BMI 45.48 kg/m   Physical Exam examination patient is alert oriented no adenopathy well-dressed normal affect normal respiratory effort he does have an antalgic gait he has good dorsiflexion the ankle is given instructions to continue working on his heel cord stretching. The incision is well-healed there is no cellulitis no drainage no signs of infection. He has dorsiflexion about 10 past neutral.  Ortho Exam  Specialty Comments:  No specialty comments available.  Imaging: No results found.   PMFS History: Patient Active Problem List   Diagnosis Date Noted  . Status post transmetatarsal amputation  of foot, right ([place]) 11/08/2016  . Diabetic polyneuropathy associated with type 2 diabetes mellitus ([place]) 11/08/2016  . Pure hypercholesterolemia   . AKI (acute kidney injury) ([place])   . Acute blood loss anemia   . DM type 2 with diabetic peripheral neuropathy ([place])   . Physical debility 08/03/2016  . Chronic osteomyelitis of right foot ([place])   . Cellulitis and abscess of leg, except foot   . Penetrating wound of right foot   . CKD (chronic kidney disease) stage 3, GFR 30-59 ml/min   . Unstable angina pectoris ([place]) 07/18/2016  . Chronic combined systolic and diastolic congestive heart failure ([place])   . Chest pain with moderate risk for cardiac etiology 06/24/2014  . CAD S/P multiple PCIs 06/24/2014  . Sleep apnea- on C-pap 06/24/2014  . Morbid obesity due to excess calories ([place]) 11/10/2013  . Hyperglycemia 10/29/2013  . Hyperlipidemia 01/12/2011  . Benign essential HTN 01/12/2011  . GERD 01/12/2011   Past Medical History:  Diagnosis Date  . Chronic combined systolic and diastolic CHF (congestive heart failure) ([place])    a. 07/2015 Echo: EF 45-50%, mod LVH, mid apical/antsept AK, Gr 1 DD.  [name] CKD (chronic kidney disease), stage III   . Coronary artery disease    a. 2012 s/p MI/PCI: s/p DES to mid/dist RCA and overlapping DES to LAD;  b. 07/2015 Cath: LAD 10% ISR, D1 40(jailed), LCX  17m, OM2 30, OM3 30, RCA 44m ISR, 10d ISR; c. 07/2016 NSTEMI/PCI: LAD 85ost/p/m (3.5x20 Synergy DES overlapping prior stent), D1 40, LCX 66m, OM2 95 (2.75x16 Synergy DES), OM3 30, RCA 58m, 10d.  . Depression   . Diabetic gastroparesis ([place])   . GERD (gastroesophageal reflux disease)   . Gout   . Gout   . Heart murmur   . Hyperlipemia   . Hypertensive heart disease   . Ischemic cardiomyopathy    a. 07/2015 Echo: EF 45-50%, mod LVH, mid-apicalanteroseptal AK, Gr1 DD.  . Morbid obesity ([place])   . Myocardial infarction 2012  . OSA on CPAP   . Osteomyelitis ([place])    a. 07/2016 MTP joint of R great toe s/p  transmetatarsal amputation.  . Polyneuropathy in diabetes(357.2)   . Pulmonary sarcoidosis ([place])    "problems w/it years ago; no problems anymore" (07/03/2014)  . Type II diabetes mellitus ([facility])     Family History  Problem Relation Age of Onset  . Heart attack Maternal Aunt   . Diabetes Maternal Grandmother   . Hypertension Maternal Grandmother     Past Surgical History:  Procedure Laterality Date  . AMPUTATION Right 07/24/2016   Procedure: TRANSMETATARSAL FOOT AMPUTATION;  Surgeon: [name], MD;  Location: [facility];  Service: Orthopedics;  Laterality: Right;  . BREAST LUMPECTOMY Right ~ 2000   "benign"  . CARDIAC CATHETERIZATION N/A 07/30/2015   Procedure: Left Heart Cath and Coronary Angiography;  Surgeon: Burnell Blanks, MD;  Location: [facility];  Service: Cardiovascular;  Laterality: N/A;  . CARDIAC CATHETERIZATION N/A 07/19/2016   Procedure: Left Heart Cath and Coronary Angiography;  Surgeon: Belva Crome, MD;  Location: [facility];  Service: Cardiovascular;  Laterality: N/A;  . CARDIAC CATHETERIZATION N/A 07/29/2016   Procedure: Coronary Stent Intervention;  Surgeon: Belva Crome, MD;  Location: [facility];  Service: Cardiovascular;  Laterality: N/A;  . CARDIAC CATHETERIZATION N/A 07/29/2016   Procedure: Intravascular Ultrasound/IVUS;  Surgeon: Belva Crome, MD;  Location: [facility];  Service: Cardiovascular;  Laterality: N/A;  . CATARACT EXTRACTION W/ INTRAOCULAR LENS  IMPLANT, BILATERAL Bilateral 2014-2015  . CORONARY ANGIOPLASTY WITH STENT PLACEMENT  10/31/2011   30 % MID ATRIOVENTRICULAR GROOVE CX STENOSIS. 90 % MID RCA.PTA TO LAD/STENTING OF TANDEM 60-70%. LAD STENOSIS 99% REDUCED TO 0%.3.0 X 32MM PROMUS DES POSTDILATED TO 3.25MM.  [name] CORONARY ANGIOPLASTY WITH STENT PLACEMENT  07/03/2014   "2"  . I&D EXTREMITY Right 10/30/2013   Procedure: IRRIGATION AND DEBRIDEMENT RIGHT SMALL FINGER POSSIBLE SECONDARY WOUND CLOSURE;  Surgeon: [name], MD;  [facility];  Service: Orthopedics;  Laterality: Right;  [name] is available after 4pm  . I&D EXTREMITY Right 11/01/2013   Procedure:  IRRIGATION AND DEBRIDEMENT RIGHT  PROXIMAL PHALANGEAL LEVEL AMPUTATION;  Surgeon: [name], MD;  [facility];  Service: Orthopedics;  Laterality: Right;  . INCISION AND DRAINAGE Right 10/28/2013   Procedure: INCISION AND DRAINAGE Right small finger;  Surgeon: [name], MD;  [facility];  Service: Orthopedics;  Laterality: Right;  . LEFT HEART CATH Left 11/03/2011   Procedure: LEFT HEART CATH;  Surgeon: [name], MD;  [name];  Service: Cardiovascular;  Laterality: Left;  . LEFT HEART CATHETERIZATION WITH CORONARY ANGIOGRAM N/A 07/03/2014   Procedure: LEFT HEART CATHETERIZATION WITH CORONARY ANGIOGRAM;  Surgeon: [name], MD;  Location: [facility] CATH LAB;  Service: Cardiovascular;  Laterality:  N/A;  . PERCUTANEOUS CORONARY STENT INTERVENTION (PCI-S) Left 11/03/2011   Procedure: PERCUTANEOUS CORONARY STENT INTERVENTION (PCI-S);  Surgeon: [name], MD;  Location: [facility] CATH LAB;  Service: Cardiovascular;  Laterality: Left;  . TOE AMPUTATION Right ~ 2010   "2nd toe"  . TOE AMPUTATION Right 2012   "3rd toe"   Social History   Occupational History  . Not on file.   Social History Main Topics  . Smoking status: Never Smoker  . Smokeless tobacco: Never Used  . Alcohol use No     Comment: none since 07/2016  . Drug use: No  . Sexual activity: Yes

## 2016-11-17 ENCOUNTER — Encounter: Payer: Self-pay | Admitting: Cardiovascular Disease

## 2016-11-17 ENCOUNTER — Ambulatory Visit (INDEPENDENT_AMBULATORY_CARE_PROVIDER_SITE_OTHER): Payer: BLUE CROSS/BLUE SHIELD | Admitting: Cardiovascular Disease

## 2016-11-17 VITALS — BP 150/86 | HR 68 | Ht 69.0 in | Wt 307.2 lb

## 2016-11-17 DIAGNOSIS — I10 Essential (primary) hypertension: Secondary | ICD-10-CM | POA: Diagnosis not present

## 2016-11-17 DIAGNOSIS — Z79899 Other long term (current) drug therapy: Secondary | ICD-10-CM

## 2016-11-17 DIAGNOSIS — E785 Hyperlipidemia, unspecified: Secondary | ICD-10-CM

## 2016-11-17 DIAGNOSIS — G4733 Obstructive sleep apnea (adult) (pediatric): Secondary | ICD-10-CM | POA: Diagnosis not present

## 2016-11-17 DIAGNOSIS — N183 Chronic kidney disease, stage 3 unspecified: Secondary | ICD-10-CM

## 2016-11-17 DIAGNOSIS — I2510 Atherosclerotic heart disease of native coronary artery without angina pectoris: Secondary | ICD-10-CM

## 2016-11-17 DIAGNOSIS — E1142 Type 2 diabetes mellitus with diabetic polyneuropathy: Secondary | ICD-10-CM | POA: Diagnosis not present

## 2016-11-17 DIAGNOSIS — Z9861 Coronary angioplasty status: Secondary | ICD-10-CM

## 2016-11-17 DIAGNOSIS — I110 Hypertensive heart disease with heart failure: Secondary | ICD-10-CM

## 2016-11-17 LAB — CBC
HCT: 39.3 % (ref 38.5–50.0)
Hemoglobin: 12.3 g/dL — ABNORMAL LOW (ref 13.2–17.1)
MCH: 26.5 pg — ABNORMAL LOW (ref 27.0–33.0)
MCHC: 31.3 g/dL — ABNORMAL LOW (ref 32.0–36.0)
MCV: 84.5 fL (ref 80.0–100.0)
MPV: 9.7 fL (ref 7.5–12.5)
Platelets: 207 10*3/uL (ref 140–400)
RBC: 4.65 MIL/uL (ref 4.20–5.80)
RDW: 16 % — ABNORMAL HIGH (ref 11.0–15.0)
WBC: 7.3 10*3/uL (ref 3.8–10.8)

## 2016-11-17 LAB — TSH: TSH: 2.15 mIU/L (ref 0.40–4.50)

## 2016-11-17 LAB — COMPREHENSIVE METABOLIC PANEL
ALT: 16 U/L (ref 9–46)
AST: 15 U/L (ref 10–35)
Albumin: 3.4 g/dL — ABNORMAL LOW (ref 3.6–5.1)
Alkaline Phosphatase: 140 U/L — ABNORMAL HIGH (ref 40–115)
BUN: 26 mg/dL — ABNORMAL HIGH (ref 7–25)
CO2: 22 mmol/L (ref 20–31)
Calcium: 8.7 mg/dL (ref 8.6–10.3)
Chloride: 105 mmol/L (ref 98–110)
Creat: 2.06 mg/dL — ABNORMAL HIGH (ref 0.70–1.33)
Glucose, Bld: 177 mg/dL — ABNORMAL HIGH (ref 65–99)
Potassium: 3.9 mmol/L (ref 3.5–5.3)
Sodium: 139 mmol/L (ref 135–146)
Total Bilirubin: 0.3 mg/dL (ref 0.2–1.2)
Total Protein: 6.6 g/dL (ref 6.1–8.1)

## 2016-11-17 LAB — LIPID PANEL
Cholesterol: 158 mg/dL (ref <200)
HDL: 74 mg/dL (ref >40)
LDL Cholesterol: 61 mg/dL (ref <100)
Total CHOL/HDL Ratio: 2.1 Ratio (ref <5.0)
Triglycerides: 116 mg/dL (ref <150)
VLDL: 23 mg/dL (ref <30)

## 2016-11-17 MED ORDER — HYDRALAZINE HCL 50 MG PO TABS: 50.0000 mg | ORAL_TABLET | Freq: Three times a day (TID) | ORAL | 11 refills | Status: DC

## 2016-11-17 NOTE — Patient Instructions (Signed)
Your physician recommends that you return for lab work in: [place].   Your physician has recommended that you have a sleep study. This test records several body functions during sleep, including: brain activity, eye movement, oxygen and carbon dioxide blood levels, heart rate and rhythm, breathing rate and rhythm, the flow of air through your mouth and nose, snoring, body muscle movements, and chest and belly movement.  Your physician has recommended you make the following change in your medication:   1.) the hydralazine has been increased to 1 tablet every 8 hours.  Your physician recommends that you schedule a follow-up appointment in: 3 months.

## 2016-11-18 LAB — HEMOGLOBIN A1C
Hgb A1c MFr Bld: 8.5 % — ABNORMAL HIGH (ref <5.7)
Mean Plasma Glucose: 197 mg/dL

## 2016-11-19 NOTE — Progress Notes (Signed)
Patient ID: SRIHAAN MASTRANGELO, male   DOB: [birth year], 58 y.o.   MRN: [id number]      HPI: Joshua [name] is a 60 y.o. male who presents to the office for a follow-up cardiology evaluation following his recent hospitalizations.  [name] suffered an acute coronary syndrome on 10/31/2011 and presented with ECG findings of anterolateral ST segment elevation. Cardiac catheterization revealed a 99% mid LAD stenosis and proximal to this there was a 60-70% stenosis after the first diagonal vessel. He also had 90-95% mid RCA stenosis and 30% circumflex stenosis. He underwent initial acute intervention to his LAD and a 3.0x32 mm Promus DES stent was inserted. 3 days later he underwent successful staged PCI to his 95% RCA stenosis and a 3.0x18 mm Resolute DES stent was inserted and post dilated 3.5 mm.   Additional problems include at least a >30 year history of hypertension, a 25 year history of diabetes mellitus, obesity, as well as hyperlipidemia.  He was admitted to the hospital on 07/29/2015 with complaints of shortness of breath similar to his initial symptomatology.  He had mild renal insufficiency, was hydrated and underwent cardiac catheterization by Dr. Julianne Handler in 07/30/2015.  His LAD stents were patent.  There was mild 40% first agonal stenosis, 60% circumflex stenosis, and his RCA stents had a 50% intimal narrowing in its mid stent without significant narrowing in the distal stent.  An echo Doppler study on 07/31/2015 showed an EF of 45-50% with moderate left ventricular hypertrophy and grade 1 diastolic dysfunction.   He was seen by Jorja Loa on 08/08/2015 for initial posthospital evaluation.  Since that time, he has had some vague symptoms of GERD.  He is not on any proton pump inhibitor.  He gets shortness of breath with minimal activity including brushing his teeth and taking a shower.  He has not been routinely exercising.  He has obstructive sleep apnea and uses CPAP therapy.  He denies  residual snoring.  He works at the [employer] in the night shift.  At times he feels that some of his food may get stuck when swallowing but this is not consistent.    When I saw him in September 2016, I started him on Protonix for significant GERD symptoms which has improved.  We discussed weight loss and exercise. Laboratory showed worsening renal function with a creatinine of 1.84, significant glucose elevation at 669 with probable factitious hyponatremia at 129, due to high glucose levels.  His hemoglobin A1c was 14.2.  I referred him to Dr. Michiel Sites for endocrinologic evaluation. I also discontinued his spironolactone and added hydralazine 25 mg twice a day for more optimal blood pressure control in light of his renal insufficiency.  We discussed the importance of weight loss.  When I last saw him in the office in March 2017, he has been in a cast and boot in his right leg due to prior infection of his right toe. He did undergo hyperbaric chamber therapy.  He tells me he was initially told of having borderline osteomyelitis. Four weeks ago his cultures were negative.  He subsequently required right metatarsal amputation for osteomyelitis in August 2017.  In August he was admitted to [facility] with unstable angina and ruled in for a non-STEM>  Repeat catheterization showed new eccentric proximal LAD stenosis, not involving the previously placed overlapping mid LAD stents, eccentric 75% mid left circumflex stenosis between OM1 and OM 2, and progression of OM1 stenosis to 95%.  He ultimately  underwent stenting to his LAD and circumflex marginal vessel with Synergy stents.  He was readmitted in October 2017 with recurrent chest pain which was felt to be atypical and musculoskeletal.  A 2 day protocol nuclear stress test showed an EF of 39% without ischemia.  He was seen in office  follow-up by Almyra Deforest, [place] on 10/22/2016. He denies recurrent episodes of chest pain.  His sleep has been poor and  he feels tired.  Over 10 years ago he was evaluated for sleep apnea but only use CPAP for 6 months. He presents for evaluation.   Past Medical History:  Diagnosis Date  . Chronic combined systolic and diastolic CHF (congestive heart failure) ([place])    a. 07/2015 Echo: EF 45-50%, mod LVH, mid apical/antsept AK, Gr 1 DD.  [name] CKD (chronic kidney disease), stage III   . Coronary artery disease    a. 2012 s/p MI/PCI: s/p DES to mid/dist RCA and overlapping DES to LAD;  b. 07/2015 Cath: LAD 10% ISR, D1 40(jailed), LCX 27m OM2 30, OM3 30, RCA 562mSR, 10d ISR; c. 07/2016 NSTEMI/PCI: LAD 85ost/p/m (3.5x20 Synergy DES overlapping prior stent), D1 40, LCX 7524mM2 95 (2.75x16 Synergy DES), OM3 30, RCA 43m65md.  . Depression   . Diabetic gastroparesis (HCC)[place]. GERD (gastroesophageal reflux disease)   . Gout   . Gout   . Heart murmur   . Hyperlipemia   . Hypertensive heart disease   . Ischemic cardiomyopathy    a. 07/2015 Echo: EF 45-50%, mod LVH, mid-apicalanteroseptal AK, Gr1 DD.  . Morbid obesity (HCC)[place]. Myocardial infarction 2012  . OSA on CPAP   . Osteomyelitis (HCC)[place] a. 07/2016 MTP joint of R great toe s/p transmetatarsal amputation.  . Polyneuropathy in diabetes(357.2)   . Pulmonary sarcoidosis (HCC)[place] "problems w/it years ago; no problems anymore" (07/03/2014)  . Type II diabetes mellitus (HCC)[place]  Past Surgical History:  Procedure Laterality Date  . AMPUTATION Right 07/24/2016   Procedure: TRANSMETATARSAL FOOT AMPUTATION;  Surgeon: MarcNewt Minion;  [facility];  Laterality: Right;  . BREAST LUMPECTOMY Right ~ 2000   "benign"  . CARDIAC CATHETERIZATION N/A 07/30/2015   Procedure: Left Heart Cath and Coronary Angiography;  Surgeon: ChriBurnell Blanks;  [facility];  Service: Cardiovascular;  Laterality: N/A;  . CARDIAC CATHETERIZATION N/A 07/19/2016   Procedure: Left Heart Cath and Coronary Angiography;  Surgeon: HenrBelva Crome;   [facility];  Service: Cardiovascular;  Laterality: N/A;  . CARDIAC CATHETERIZATION N/A 07/29/2016   Procedure: Coronary Stent Intervention;  Surgeon: HenrBelva Crome;  [facility];  Service: Cardiovascular;  Laterality: N/A;  . CARDIAC CATHETERIZATION N/A 07/29/2016   Procedure: Intravascular Ultrasound/IVUS;  Surgeon: HenrBelva Crome;  [facility];  Service: Cardiovascular;  Laterality: N/A;  . CATARACT EXTRACTION W/ INTRAOCULAR LENS  IMPLANT, BILATERAL Bilateral 2014-2015  . CORONARY ANGIOPLASTY WITH STENT PLACEMENT  10/31/2011   30 % MID ATRIOVENTRICULAR GROOVE CX STENOSIS. 90 % MID RCA.PTA TO LAD/STENTING OF TANDEM 60-70%. LAD STENOSIS 99% REDUCED TO 0%.3.0 X 32MM PROMUS DES POSTDILATED TO 3.25MM.  . COMarland KitchenONARY ANGIOPLASTY WITH STENT PLACEMENT  07/03/2014   "2"  . I&D EXTREMITY Right 10/30/2013   Procedure: IRRIGATION AND DEBRIDEMENT RIGHT SMALL FINGER POSSIBLE SECONDARY WOUND CLOSURE;  Surgeon: MattSchuyler Amor;  [facility];  Service: Orthopedics;  Laterality: Right;  [name] is available after 4pm  . I&D EXTREMITY Right 11/01/2013   Procedure:  IRRIGATION AND DEBRIDEMENT RIGHT  PROXIMAL PHALANGEAL LEVEL AMPUTATION;  Surgeon: [name], MD;  Location: [facility]S;  Service: Orthopedics;  Laterality: Right;  . INCISION AND DRAINAGE Right 10/28/2013   Procedure: INCISION AND DRAINAGE Right small finger;  Surgeon: [name], MD;  Location: [facility]S;  Service: Orthopedics;  Laterality: Right;  . LEFT HEART CATH Left 11/03/2011   Procedure: LEFT HEART CATH;  Surgeon: [name], MD;  Location: [facility] CATH LAB;  Service: Cardiovascular;  Laterality: Left;  . LEFT HEART CATHETERIZATION WITH CORONARY ANGIOGRAM N/A 07/03/2014   Procedure: LEFT HEART CATHETERIZATION WITH CORONARY ANGIOGRAM;  Surgeon: [name], MD;  Location: [facility] C[facility] LAB;  Service: Cardiovascular;  Laterality: N/A;  . PERCUTANEOUS CORONARY STENT  INTERVENTION (PCI-S) Left 11/03/2011   Procedure: PERCUTANEOUS CORONARY STENT INTERVENTION (PCI-S);  Surgeon: [name], MD;  Location: [facility] CATH LAB;  Service: Cardiovascular;  Laterality: Left;  . TOE AMPUTATION Right ~ 2010   "2nd toe"  . TOE AMPUTATION Right 2012   "3rd toe"    No Known Allergies  Current Outpatient Prescriptions  Medication Sig Dispense Refill  . acetaminophen (TYLENOL) 325 MG tablet Take 2 tablets (650 mg total) by mouth every 4 (four) hours as needed for headache or mild pain.    [name] allopurinol (ZYLOPRIM) 300 MG tablet Take 1 tablet (300 mg total) by mouth daily. 30 tablet 0  . amLODipine (NORVASC) 10 MG tablet Take 1 tablet (10 mg total) by mouth daily. 30 tablet 0  . aspirin EC 81 MG tablet Take 81 mg by mouth daily.    . hydrALAZINE (APRESOLINE) 50 MG tablet Take 1 tablet (50 mg total) by mouth every 8 (eight) hours. 90 tablet 11  . insulin NPH Human (HUMULIN N,NOVOLIN N) 100 UNIT/ML injection Use 20 units in morning and 22 units at bedtime. (Patient taking differently: Inject 20-25 Units into the skin 2 (two) times daily. Use 25 units in morning and 20 units at bedtime.) 10 mL 11  . isosorbide mononitrate (IMDUR) 30 MG 24 hr tablet TAKE 3 TABLETS (90 MG TOTAL) BY MOUTH DAILY. 90 tablet 6  . metoprolol succinate (TOPROL-XL) 100 MG 24 hr tablet Take 1 tablet (100 mg total) by mouth daily. 90 tablet 3  . nitroGLYCERIN (NITROSTAT) 0.4 MG SL tablet Place 1 tablet (0.4 mg total) under the tongue every 5 (five) minutes as needed for chest pain. 25 tablet 3  . NOVOLIN R 100 UNIT/ML injection Sliding scale as directed 70-150 = 3 units 151-200 =5 units 201-250 =7 units 251-300 =9 units >300 = 12 units  5  . pantoprazole (PROTONIX) 40 MG tablet Take 1 tablet (40 mg total) by mouth daily. 30 tablet 0  . rosuvastatin (CRESTOR) 40 MG tablet Take 1 tablet (40 mg total) by mouth daily at 6 PM. 30 tablet 0  . ticagrelor (BRILINTA) 90 MG TABS tablet Take 1 tablet (90 mg  total) by mouth 2 (two) times daily. 60 tablet 0   No current facility-administered medications for this visit.     Social History   Social History  . Marital status: Married    Spouse name: N/A  . Number of children: N/A  . Years of education: N/A   Occupational History  . Not on file.   Social History Main Topics  . Smoking status: Never Smoker  .  Smokeless tobacco: Never Used  . Alcohol use No     Comment: none since 07/2016  . Drug use: No  . Sexual activity: Yes   Other Topics Concern  . Not on file   Social History Narrative  . No narrative on file    Family History  Problem Relation Age of Onset  . Heart attack Maternal Aunt   . Diabetes Maternal Grandmother   . Hypertension Maternal Grandmother     ROS General: Negative; No fevers, chills, or night sweats;  HEENT: Negative; No changes in vision or hearing, sinus congestion, difficulty swallowing Pulmonary: Negative; No cough, wheezing, shortness of breath, hemoptysis Cardiovascular: Negative; No chest pain, presyncope, syncope, palpitations GI: Negative; No nausea, vomiting, diarrhea, or abdominal pain GU: Negative; No dysuria, hematuria, or difficulty voiding Musculoskeletal: Negative; no myalgias, joint pain, or weakness Hematologic/Oncology: Negative; no easy bruising, bleeding Endocrine: Negative; no heat/cold intolerance; no diabetes Neuro: Negative; no changes in balance, headaches Skin: Negative; No rashes or skin lesions Psychiatric: Negative; No behavioral problems, depression Sleep: Positive for obstructive sleep apnea not on CPAP therapy;  snoring, daytime sleepiness, hypersomnolence, no bruxism, restless legs, hypnogognic hallucinations, no cataplexy Other comprehensive 14 point system review is negative.   PE BP (!) 150/86 (BP Location: Left Arm, Patient Position: Sitting, Cuff Size: Large)   Pulse 68   Ht '5\' 9"'$  (1.753 m)   Wt (!) 307 lb 4 oz (139.4 kg)   BMI 45.37 kg/m   Repeat blood  pressure by me was 152/88  Wt Readings from Last 3 Encounters:  11/17/16 (!) 307 lb 4 oz (139.4 kg)  11/08/16 (!) 308 lb (139.7 kg)  11/01/16 (!) 308 lb (139.7 kg)   General: Alert, oriented, no distress.  Skin: normal turgor, no rashes HEENT: Normocephalic, atraumatic. Pupils round and reactive; sclera anicteric;no lid lag.  Nose without nasal septal hypertrophy Mouth/Parynx benign; Mallinpatti scale 3/4 Neck: Very thick neck; No JVD, no carotid bruits with normal carotid upstroke Lungs: clear to ausculatation and percussion; no wheezing or rales Chest wall: Nontender to palpation Heart: RRR, s1 s2 normal 1/6 systolic murmur, unchanged.  No S3 gallop.  No rubs thrills or heaves Abdomen: Moderate central adiposity. soft, nontender; no hepatosplenomehaly, BS+; abdominal aorta nontender and not dilated by palpation. Back: No CVA tenderness Pulses 2+ Extremities:  Right metatarsal amputation; Homan's sign negative  Neurologic: grossly nonfocal Psychologic: normal affect and mood.  ECG (independently read by me): normal sinus rhythm at 68 bpm.  First-degree AV block with a PR interval 212 ms.  No significant ST changes.  March ECG (independently read by me):   Normal sinus rhythm at 80 bpm..  No ectopy.  Borderline left atrial enlargement.  November 2016 ECG (independently read by me):  Normal sinus rhythm at 77 bpm.  Poor R wave progression anteriorly.  QTc interval 425 ms. Borderline first-degree AV block.  ECG (independently read by me): Sinus rhythm at 85 bpm.  Early repolarization changes anterolaterally.  Prior 2014 ECG: Normal sinus rhythm at 90 beats per minute. Poor progression V1 through V4. Normal intervals.  LABS: BMP Latest Ref Rng & Units 11/17/2016 11/01/2016 10/13/2016  Glucose 65 - 99 mg/dL 177(H) 132(H) 136(H)  BUN 7 - 25 mg/dL 26(H) 24 17  Creatinine 0.70 - 1.33 mg/dL 2.06(H) 1.50(H) 1.38(H)  Sodium 135 - 146 mmol/L 139 141 141  Potassium 3.5 - 5.3 mmol/L 3.9 3.8  3.6  Chloride 98 - 110 mmol/L 105 108 110  CO2 20 - 31 mmol/L  _0 Calcium 8.6 - 10.3 mg/dL 8.7 8.8 8.7(L)   Hepatic Function Latest Ref Rng & Units 11/17/2016 08/04/2016 02/16/2016  Total Protein 6.1 - 8.1 g/dL 6.6 7.3 6.9  Albumin 3.6 - 5.1 g/dL 3.4(L) 2.3(L) 3.5(L)  AST 10 - 35 U/L _1 ALT 9 - 46 U/L 16 16(L) 23  Alk Phosphatase 40 - 115 U/L 140(H) 114 131(H)  Total Bilirubin 0.2 - 1.2 mg/dL 0.3 0.3 0.4   CBC Latest Ref Rng & Units 11/17/2016 10/13/2016 10/11/2016  WBC 3.8 - 10.8 K/uL 7.3 5.7 6.0  Hemoglobin 13.2 - 17.1 g/dL 12.3(L) 11.2(L) 10.6(L)  Hematocrit 38.5 - 50.0 % 39.3 36.0(L) 33.5(L)  Platelets 140 - 400 K/uL 207 205 218   Lab Results  Component Value Date   MCV 84.5 11/17/2016   MCV 85.3 10/13/2016   MCV 84.0 10/11/2016   Lab Results  Component Value Date   TSH 2.15 11/17/2016   Lab Results  Component Value Date   HGBA1C 8.5 (H) 11/17/2016   Lipid Panel     Component Value Date/Time   CHOL 158 11/17/2016 0921   TRIG 116 11/17/2016 0921   HDL 74 11/17/2016 0921   CHOLHDL 2.1 11/17/2016 0921   VLDL 23 11/17/2016 0921   LDLCALC 61 11/17/2016 0921      RADIOLOGY: Dg Chest 2 View  10/28/2013   CLINICAL DATA:  Shortness of breath  EXAM: CHEST  2 VIEW  COMPARISON:  10/31/2011  FINDINGS: Mild cardiomegaly, stable from prior. [place] A and B lines noted. No significant effusion. No asymmetric airspace opacity. No acute osseous findings.  IMPRESSION: Mild interstitial pulmonary edema.   Electronically Signed   By: Jorje Guild M.D.   On: 10/28/2013 23:01   Dg Hand Complete Right  10/28/2013   CLINICAL DATA:  Little finger laceration with pain  EXAM: RIGHT HAND - COMPLETE 3+ VIEW  COMPARISON:  None.  FINDINGS: There is extensive soft tissue swelling of the 5th digit, extending into the dorsal hand. No radiodense foreign body. No osseous erosion or asymmetric joint narrowing to suggest osseous/joint infection. No acute fracture or malalignment.  Remote  ulnar and radial styloid process fractures.  Degenerative interphalangeal joint narrowing throughout the digits.  IMPRESSION: No evidence of osseous infection.  No radiodense foreign body.   Electronically Signed   By: Jorje Guild M.D.   On: 10/28/2013 23:04      ASSESSMENT AND PLAN: Mr. Gianlucas Evenson is a 60 year old African-American male who has a history of morbid obesity and suffered an acute coronary syndrome/ anterolateral ST segment elevation myocardial infarction in November 2012. At that time he underwent stenting to his subtotaled LAD stenosis and 3 days later underwent staged intervention to a high grade mid right coronary artery stenosis. A nuclear perfusion study in January 2013 was low risk and did show small area of anteroseptal scar towards the apex without associated ischemia.  He  .osteomyelitis right metatarsal amputation in August 2017.  Subsequently, he developed chest pain and ruled in with a NSTEMI.  Repeat catheterization he was found to have progressive proximal LAD disease and circumflex marginal disease and underwent two-vessel PCI.  I reviewed his hospitalization records and catheterization report.  Subsequently, he developed atypical chest pain which was described as more of a piercing sensation.  A nuclear study did not reveal new ischemia in October 2017.  His blood pressure today is elevated despite taking amlodipine 10 mg, isosorbide 30 mg, Toprol 100  mg, and hydralazine 50 g twice a day.  I have recommended that he increase hydralazineto 50 mg every 8 hours.  I'm concerned that his hypertension may be contributed by untreated sleep apnea.  I strongly recommended that he undergo a reat sleep evaluationp therapy.  We discussed the importance of weight loss with his morbid obesity.  A complete set of fasting blood work will be obtained.  He continues to be on dual antiplatelet therapy with aspirin and Brilinta and is without bleeding.  He is on high potency statin with  rosuvastatin 40 mg with target LDL less than 70.  I will see him in 3 months for reevaluation.  Time spent: 25 minutes  [name], MD, [facility]  11/19/2016 10:29 PM

## 2016-11-24 ENCOUNTER — Encounter: Payer: Self-pay | Admitting: Physical Medicine & Rehabilitation

## 2016-11-24 ENCOUNTER — Encounter
Payer: BLUE CROSS/BLUE SHIELD | Attending: Physical Medicine & Rehabilitation | Admitting: Physical Medicine & Rehabilitation

## 2016-11-24 VITALS — BP 162/78 | HR 71 | Resp 14

## 2016-11-24 DIAGNOSIS — E1165 Type 2 diabetes mellitus with hyperglycemia: Secondary | ICD-10-CM | POA: Diagnosis not present

## 2016-11-24 DIAGNOSIS — M869 Osteomyelitis, unspecified: Secondary | ICD-10-CM | POA: Diagnosis present

## 2016-11-24 DIAGNOSIS — G546 Phantom limb syndrome with pain: Secondary | ICD-10-CM

## 2016-11-24 DIAGNOSIS — I2510 Atherosclerotic heart disease of native coronary artery without angina pectoris: Secondary | ICD-10-CM | POA: Diagnosis not present

## 2016-11-24 DIAGNOSIS — E1142 Type 2 diabetes mellitus with diabetic polyneuropathy: Secondary | ICD-10-CM | POA: Diagnosis not present

## 2016-11-24 DIAGNOSIS — Z9889 Other specified postprocedural states: Secondary | ICD-10-CM | POA: Diagnosis not present

## 2016-11-24 DIAGNOSIS — Z89431 Acquired absence of right foot: Secondary | ICD-10-CM

## 2016-11-24 DIAGNOSIS — Z89421 Acquired absence of other right toe(s): Secondary | ICD-10-CM | POA: Diagnosis present

## 2016-11-24 DIAGNOSIS — X58XXXD Exposure to other specified factors, subsequent encounter: Secondary | ICD-10-CM | POA: Diagnosis not present

## 2016-11-24 DIAGNOSIS — R269 Unspecified abnormalities of gait and mobility: Secondary | ICD-10-CM | POA: Diagnosis not present

## 2016-11-24 DIAGNOSIS — S37009D Unspecified injury of unspecified kidney, subsequent encounter: Secondary | ICD-10-CM | POA: Insufficient documentation

## 2016-11-24 DIAGNOSIS — I10 Essential (primary) hypertension: Secondary | ICD-10-CM

## 2016-11-24 DIAGNOSIS — I509 Heart failure, unspecified: Secondary | ICD-10-CM | POA: Insufficient documentation

## 2016-11-24 NOTE — Progress Notes (Signed)
Subjective:    Patient ID: Joshua [name], male    DOB: 02/13/1956, 60 y.o.   MRN: [id number]  HPI   60 year old right-handed male with history of CAD, diabetes mellitus, chronic renal insufficiency, morbid obesity presents for follow up for right TMA.    Last clinic visit 08/26/16.  Since last visit, he saw Ortho, he has been cleared to weight bear.  He has had some blood pressure and HR issues.  He has since purchased a new glucometer.  His HbA1c last week was 8.4. He has also been cleared by ID.  His pain is  Improving.  He notes phantom pain for brief periods.   Pain Inventory Average Pain 5 Pain Right Now 4 My pain is aching  In the last 24 hours, has pain interfered with the following? General activity 1 Relation with others 0 Enjoyment of life 1 What TIME of day is your pain at its worst? varies Sleep (in general) Fair  Pain is worse with: lack of medication Pain improves with: rest and medication Relief from Meds: 7  Mobility walk with assistance use a walker how many minutes can you walk? 3 ability to climb steps?  yes do you drive?  no use a wheelchair Do you have any goals in this area?  yes  Function not employed: date last employed 03/2016 Do you have any goals in this area?  yes  Neuro/Psych trouble walking depression anxiety  Prior Studies x-rays CT/MRI hospital f/u  Physicians involved in your care hospital f/u   Family History  Problem Relation Age of Onset  . Heart attack Maternal Aunt   . Diabetes Maternal Grandmother   . Hypertension Maternal Grandmother    Social History   Social History  . Marital status: Married    Spouse name: N/A  . Number of children: N/A  . Years of education: N/A   Social History Main Topics  . Smoking status: Never Smoker  . Smokeless tobacco: Never Used  . Alcohol use No     Comment: none since 07/2016  . Drug use: No  . Sexual activity: Yes   Other Topics Concern  . None   Social History  Narrative  . None   Past Surgical History:  Procedure Laterality Date  . AMPUTATION Right 07/24/2016   Procedure: TRANSMETATARSAL FOOT AMPUTATION;  Surgeon: [name], MD;  Location: [place];  Service: Orthopedics;  Laterality: Right;  . BREAST LUMPECTOMY Right ~ 2000   "benign"  . CARDIAC CATHETERIZATION N/A 07/30/2015   Procedure: Left Heart Cath and Coronary Angiography;  Surgeon: Burnell Blanks, MD;  Location: [place] [facility];  Service: Cardiovascular;  Laterality: N/A;  . CARDIAC CATHETERIZATION N/A 07/19/2016   Procedure: Left Heart Cath and Coronary Angiography;  Surgeon: Belva Crome, MD;  Location: [place] [facility];  Service: Cardiovascular;  Laterality: N/A;  . CARDIAC CATHETERIZATION N/A 07/29/2016   Procedure: Coronary Stent Intervention;  Surgeon: Belva Crome, MD;  Location: [place] [facility];  Service: Cardiovascular;  Laterality: N/A;  . CARDIAC CATHETERIZATION N/A 07/29/2016   Procedure: Intravascular Ultrasound/IVUS;  Surgeon: Belva Crome, MD;  Location: [place] [facility];  Service: Cardiovascular;  Laterality: N/A;  . CATARACT EXTRACTION W/ INTRAOCULAR LENS  IMPLANT, BILATERAL Bilateral 2014-2015  . CORONARY ANGIOPLASTY WITH STENT PLACEMENT  10/31/2011   30 % MID ATRIOVENTRICULAR GROOVE CX STENOSIS. 90 % MID RCA.PTA TO LAD/STENTING OF TANDEM 60-70%. LAD STENOSIS 99% REDUCED TO 0%.3.0 X 32MM PROMUS  DES POSTDILATED TO 3.25MM.  [name] CORONARY ANGIOPLASTY WITH STENT PLACEMENT  07/03/2014   "2"  . I&D EXTREMITY Right 10/30/2013   Procedure: IRRIGATION AND DEBRIDEMENT RIGHT SMALL FINGER POSSIBLE SECONDARY WOUND CLOSURE;  Surgeon: [name], MD;  Location[facility];  Service: Orthopedics;  Laterality: Right;  [name] is available after 4pm  . I&D EXTREMITY Right 11/01/2013   Procedure:  IRRIGATION AND DEBRIDEMENT RIGHT  PROXIMAL PHALANGEAL LEVEL AMPUTATION;  Surgeon: [name], MD;  Location[facility];  Service: Orthopedics;  Laterality: Right;  .  INCISION AND DRAINAGE Right 10/28/2013   Procedure: INCISION AND DRAINAGE Right small finger;  Surgeon: [name], MD;  [facility];  Service: Orthopedics;  Laterality: Right;  . LEFT HEART CATH Left 11/03/2011   Procedure: LEFT HEART CATH;  Surgeon: [name], MD;  Location: [facility] C[facility] LAB;  Service: Cardiovascular;  Laterality: Left;  . LEFT HEART CATHETERIZATION WITH CORONARY ANGIOGRAM N/A 07/03/2014   Procedure: LEFT HEART CATHETERIZATION WITH CORONARY ANGIOGRAM;  Surgeon: [name], MD;  Location: [facility] CATH LAB;  Service: Cardiovascular;  Laterality: N/A;  . PERCUTANEOUS CORONARY STENT INTERVENTION (PCI-S) Left 11/03/2011   Procedure: PERCUTANEOUS CORONARY STENT INTERVENTION (PCI-S);  Surgeon: [name], MD;  Location: [facility] CATH LAB;  Service: Cardiovascular;  Laterality: Left;  . TOE AMPUTATION Right ~ 2010   "2nd toe"  . TOE AMPUTATION Right 2012   "3rd toe"   Past Medical History:  Diagnosis Date  . Chronic combined systolic and diastolic CHF (congestive heart failure) ([place])    a. 07/2015 Echo: EF 45-50%, mod LVH, mid apical/antsept AK, Gr 1 DD.  [name] CKD (chronic kidney disease), stage III   . Coronary artery disease    a. 2012 s/p MI/PCI: s/p DES to mid/dist RCA and overlapping DES to LAD;  b. 07/2015 Cath: LAD 10% ISR, D1 40(jailed), LCX 65m, OM2 30, OM3 30, RCA 65m ISR, 10d ISR; c. 07/2016 NSTEMI/PCI: LAD 85ost/p/m (3.5x20 Synergy DES overlapping prior stent), D1 40, LCX 31m, OM2 95 (2.75x16 Synergy DES), OM3 30, RCA 43m, 10d.  . Depression   . Diabetic gastroparesis ([place])   . GERD (gastroesophageal reflux disease)   . Gout   . Gout   . Heart murmur   . Hyperlipemia   . Hypertensive heart disease   . Ischemic cardiomyopathy    a. 07/2015 Echo: EF 45-50%, mod LVH, mid-apicalanteroseptal AK, Gr1 DD.  . Morbid obesity ([place])   . Myocardial infarction 2012  . OSA on CPAP   . Osteomyelitis ([place])    a. 07/2016 MTP joint of R great toe s/p transmetatarsal  amputation.  . Polyneuropathy in diabetes(357.2)   . Pulmonary sarcoidosis ([place])    "problems w/it years ago; no problems anymore" (07/03/2014)  . Type II diabetes mellitus (HCC)    BP (!) 162/78   Pulse 71   Resp 14   SpO2 96%   Opioid Risk Score:   Fall Risk Score:  `1  Depression screen PHQ 2/9  Depression screen [facility] 2/9 11/01/2016 08/26/2016 01/31/2014 12/20/2013 11/27/2013  Decreased Interest 0 1 0 0 0  Down, Depressed, Hopeless 0 2 0 0 0  PHQ - 2 Score 0 3 0 0 0  Altered sleeping - 1 - - -  Tired, decreased energy - 1 - - -  Change in appetite - 1 - - -  Feeling bad or failure about yourself  - 2 - - -  Trouble concentrating - 1 - - -  Moving slowly or fidgety/restless - 0 - - -  Suicidal thoughts - 1 - - -  PHQ-9 Score - 10 - - -  Difficult doing work/chores - Somewhat difficult - - -    Review of Systems  HENT: Negative.   Eyes: Negative.   Respiratory: Positive for apnea.   Cardiovascular: Negative.   Endocrine: Negative.        High blood sugar  Genitourinary: Negative.   Musculoskeletal: Positive for gait problem.  Allergic/Immunologic: Negative.   Psychiatric/Behavioral: Positive for dysphoric mood. The patient is nervous/anxious.   All other systems reviewed and are negative.      Objective:   Physical Exam Constitutional:  He appears well-developed. Obese. NAD. Vital signs reviewed.  HENT: Normocephalic and atraumatic.  Eyes: EOM are normal. No discharge.  Cardiovascular: Normal rate and regular rhythm. No JVD. Respiratory: Effort normal and breath sounds normal. GI: Soft. Bowel sounds are normal.   Musculoskeletal:  Right TMA He exhibits no tenderness, no edema. Neurological: He is alert and oriented Follows all commands.  UE 5/5. RLE: 5/5HF, ADF/PF 5/5.   LLE: 5/5 prox to distal.  Skin: Skin is warm and dry. TMA healed.  Psychiatric: He has a normal mood and affect. His behavior is normal.     Assessment & Plan:  60 year old right-handed male  with history of CAD, diabetes mellitus, chronic renal insufficiency, morbid obesity presents for follow up for right TMA.    1.  Right transmetatarsal amputation secondary to osteomyelitis   Graduated from therapies  Released by Ortho, ID  WBAT  Pt states insurance denies prosthesis, pt to change insurances, will revist  2. Pain Management with phantom limb pain  Intermittent and decreasing  Cont conservative treatment.  3. Skin/Wound Care  Healed  4.  CAD  Cont meds  Cont follow up with PCP and Cardiology  5. CHF  See above  6. Diabetes mellitus with peripheral neuropathy  Follow CBGs  Follow with PCP   7. Hypertension  Cont meds  Being adjusted by Cards, elevated today  8. Morbid obesity             Cont weight loss, encouraged pt again, particularly told pt to be mindful during holidays

## 2016-11-24 NOTE — Progress Notes (Signed)
Patient ID: Joshua [name], male   DOB: [birth year], 61 y.o.   MRN: [id number]

## 2016-11-24 NOTE — Progress Notes (Deleted)
Subjective:    Patient ID: Joshua [name], male    DOB: Aug 21, 1956, 60 y.o.   MRN: [id number]  HPI  Pain Inventory Average Pain 1 Pain Right Now 0 My pain is burning  In the last 24 hours, has pain interfered with the following? General activity {NUMBERS; 0-10:5044} Relation with others {NUMBERS; 0-10:5044} Enjoyment of life {NUMBERS; 0-10:5044} What TIME of day is your pain at its worst? {TIME OF WUJ:81191478} Sleep (in general) {BHH GOOD/FAIR/POOR:22877}  Pain is worse with: {ACTIVITIES:21022942} Pain improves with: {PAIN IMPROVES GNFA:21308657} Relief from Meds: {NUMBERS; 0-10:5044}  Mobility {MOBILITY QIO:96295284}  Function {FUNCTION:21022946}  Neuro/Psych {NEURO/PSYCH:21022948}  Prior Studies {CPRM PRIOR STUDIES:21022953}  Physicians involved in your care {CPRM PHYSICIANS INVOLVED IN YOUR CARE:21022954}   Family History  Problem Relation Age of Onset  . Heart attack Maternal Aunt   . Diabetes Maternal Grandmother   . Hypertension Maternal Grandmother    Social History   Social History  . Marital status: Married    Spouse name: N/A  . Number of children: N/A  . Years of education: N/A   Social History Main Topics  . Smoking status: Never Smoker  . Smokeless tobacco: Never Used  . Alcohol use No     Comment: none since 07/2016  . Drug use: No  . Sexual activity: Yes   Other Topics Concern  . None   Social History Narrative  . None   Past Surgical History:  Procedure Laterality Date  . AMPUTATION Right 07/24/2016   Procedure: TRANSMETATARSAL FOOT AMPUTATION;  Surgeon: [name], MD;  Location: [place];  Service: Orthopedics;  Laterality: Right;  . BREAST LUMPECTOMY Right ~ 2000   "benign"  . CARDIAC CATHETERIZATION N/A 07/30/2015   Procedure: Left Heart Cath and Coronary Angiography;  Surgeon: Burnell Blanks, MD;  Location: [place] [facility];  Service: Cardiovascular;  Laterality: N/A;  . CARDIAC CATHETERIZATION N/A 07/19/2016   Procedure: Left Heart Cath and Coronary Angiography;  Surgeon: Belva Crome, MD;  Location: [place] [facility];  Service: Cardiovascular;  Laterality: N/A;  . CARDIAC CATHETERIZATION N/A 07/29/2016   Procedure: Coronary Stent Intervention;  Surgeon: Belva Crome, MD;  Location: [place] [facility];  Service: Cardiovascular;  Laterality: N/A;  . CARDIAC CATHETERIZATION N/A 07/29/2016   Procedure: Intravascular Ultrasound/IVUS;  Surgeon: Belva Crome, MD;  Location: [place] [facility];  Service: Cardiovascular;  Laterality: N/A;  . CATARACT EXTRACTION W/ INTRAOCULAR LENS  IMPLANT, BILATERAL Bilateral 2014-2015  . CORONARY ANGIOPLASTY WITH STENT PLACEMENT  10/31/2011   30 % MID ATRIOVENTRICULAR GROOVE CX STENOSIS. 90 % MID RCA.PTA TO LAD/STENTING OF TANDEM 60-70%. LAD STENOSIS 99% REDUCED TO 0%.3.0 X 32MM PROMUS DES POSTDILATED TO 3.25MM.  [name] CORONARY ANGIOPLASTY WITH STENT PLACEMENT  07/03/2014   "2"  . I&D EXTREMITY Right 10/30/2013   Procedure: IRRIGATION AND DEBRIDEMENT RIGHT SMALL FINGER POSSIBLE SECONDARY WOUND CLOSURE;  Surgeon: [name], MD;  [facility];  Service: Orthopedics;  Laterality: Right;  [name] is available after 4pm  . I&D EXTREMITY Right 11/01/2013   Procedure:  IRRIGATION AND DEBRIDEMENT RIGHT  PROXIMAL PHALANGEAL LEVEL AMPUTATION;  Surgeon: [name], MD;  Location[facility];  Service: Orthopedics;  Laterality: Right;  . INCISION AND DRAINAGE Right 10/28/2013   Procedure: INCISION AND DRAINAGE Right small finger;  Surgeon: [name], MD;  Location[facility];  Service: Orthopedics;  Laterality: Right;  . LEFT HEART CATH Left 11/03/2011   Procedure: LEFT HEART CATH;  Surgeon: [name], MD;  Location: [facility] CATH LAB;  Service: Cardiovascular;  Laterality: Left;  . LEFT HEART CATHETERIZATION WITH CORONARY ANGIOGRAM N/A 07/03/2014   Procedure: LEFT HEART CATHETERIZATION WITH CORONARY ANGIOGRAM;  Surgeon: [name], MD;  Location: [facility] CATH LAB;   Service: Cardiovascular;  Laterality: N/A;  . PERCUTANEOUS CORONARY STENT INTERVENTION (PCI-S) Left 11/03/2011   Procedure: PERCUTANEOUS CORONARY STENT INTERVENTION (PCI-S);  Surgeon: [name], MD;  Location: [facility] CATH LAB;  Service: Cardiovascular;  Laterality: Left;  . TOE AMPUTATION Right ~ 2010   "2nd toe"  . TOE AMPUTATION Right 2012   "3rd toe"   Past Medical History:  Diagnosis Date  . Chronic combined systolic and diastolic CHF (congestive heart failure) ([place])    a. 07/2015 Echo: EF 45-50%, mod LVH, mid apical/antsept AK, Gr 1 DD.  [name] CKD (chronic kidney disease), stage III   . Coronary artery disease    a. 2012 s/p MI/PCI: s/p DES to mid/dist RCA and overlapping DES to LAD;  b. 07/2015 Cath: LAD 10% ISR, D1 40(jailed), LCX 44m, OM2 30, OM3 30, RCA 74m ISR, 10d ISR; c. 07/2016 NSTEMI/PCI: LAD 85ost/p/m (3.5x20 Synergy DES overlapping prior stent), D1 40, LCX 15m, OM2 95 (2.75x16 Synergy DES), OM3 30, RCA 45m, 10d.  . Depression   . Diabetic gastroparesis ([place])   . GERD (gastroesophageal reflux disease)   . Gout   . Gout   . Heart murmur   . Hyperlipemia   . Hypertensive heart disease   . Ischemic cardiomyopathy    a. 07/2015 Echo: EF 45-50%, mod LVH, mid-apicalanteroseptal AK, Gr1 DD.  . Morbid obesity ([place])   . Myocardial infarction 2012  . OSA on CPAP   . Osteomyelitis ([place])    a. 07/2016 MTP joint of R great toe s/p transmetatarsal amputation.  . Polyneuropathy in diabetes(357.2)   . Pulmonary sarcoidosis ([place])    "problems w/it years ago; no problems anymore" (07/03/2014)  . Type II diabetes mellitus (HCC)    BP (!) 162/78   Pulse 71   Resp 14   SpO2 96%   Opioid Risk Score:   Fall Risk Score:  `1  Depression screen PHQ 2/9  Depression screen [facility] 2/9 11/01/2016 08/26/2016 01/31/2014 12/20/2013 11/27/2013  Decreased Interest 0 1 0 0 0  Down, Depressed, Hopeless 0 2 0 0 0  PHQ - 2 Score 0 3 0 0 0  Altered sleeping - 1 - - -  Tired, decreased energy - 1 - - -    Change in appetite - 1 - - -  Feeling bad or failure about yourself  - 2 - - -  Trouble concentrating - 1 - - -  Moving slowly or fidgety/restless - 0 - - -  Suicidal thoughts - 1 - - -  PHQ-9 Score - 10 - - -  Difficult doing work/chores - Somewhat difficult - - -    Review of Systems     Objective:   Physical Exam        Assessment & Plan:

## 2016-11-26 ENCOUNTER — Other Ambulatory Visit: Payer: Self-pay | Admitting: Nephrology

## 2016-11-26 DIAGNOSIS — N183 Chronic kidney disease, stage 3 unspecified: Secondary | ICD-10-CM

## 2016-11-29 ENCOUNTER — Ambulatory Visit
Admission: RE | Admit: 2016-11-29 | Discharge: 2016-11-29 | Disposition: A | Discharge status: Home / Self Care | Payer: BLUE CROSS/BLUE SHIELD | Source: Ambulatory Visit | Attending: Nephrology | Admitting: Nephrology

## 2016-11-29 DIAGNOSIS — N183 Chronic kidney disease, stage 3 unspecified: Secondary | ICD-10-CM

## 2017-01-07 ENCOUNTER — Ambulatory Visit (HOSPITAL_BASED_OUTPATIENT_CLINIC_OR_DEPARTMENT_OTHER): Payer: BLUE CROSS/BLUE SHIELD | Attending: Cardiovascular Disease | Admitting: Cardiovascular Disease

## 2017-01-07 VITALS — Ht 69.0 in | Wt 309.0 lb

## 2017-01-07 DIAGNOSIS — Z794 Long term (current) use of insulin: Secondary | ICD-10-CM | POA: Diagnosis not present

## 2017-01-07 DIAGNOSIS — I110 Hypertensive heart disease with heart failure: Secondary | ICD-10-CM | POA: Insufficient documentation

## 2017-01-07 DIAGNOSIS — G4752 REM sleep behavior disorder: Secondary | ICD-10-CM | POA: Insufficient documentation

## 2017-01-07 DIAGNOSIS — E669 Obesity, unspecified: Secondary | ICD-10-CM | POA: Diagnosis not present

## 2017-01-07 DIAGNOSIS — E119 Type 2 diabetes mellitus without complications: Secondary | ICD-10-CM | POA: Diagnosis not present

## 2017-01-07 DIAGNOSIS — I493 Ventricular premature depolarization: Secondary | ICD-10-CM | POA: Diagnosis not present

## 2017-01-07 DIAGNOSIS — R0683 Snoring: Secondary | ICD-10-CM | POA: Insufficient documentation

## 2017-01-07 DIAGNOSIS — G4761 Periodic limb movement disorder: Secondary | ICD-10-CM | POA: Diagnosis not present

## 2017-01-07 DIAGNOSIS — R5383 Other fatigue: Secondary | ICD-10-CM | POA: Insufficient documentation

## 2017-01-07 DIAGNOSIS — G4733 Obstructive sleep apnea (adult) (pediatric): Secondary | ICD-10-CM

## 2017-01-07 DIAGNOSIS — Z7982 Long term (current) use of aspirin: Secondary | ICD-10-CM | POA: Insufficient documentation

## 2017-01-07 DIAGNOSIS — I509 Heart failure, unspecified: Secondary | ICD-10-CM | POA: Insufficient documentation

## 2017-01-07 DIAGNOSIS — Z79899 Other long term (current) drug therapy: Secondary | ICD-10-CM | POA: Diagnosis not present

## 2017-01-07 DIAGNOSIS — Z6841 Body Mass Index (BMI) 40.0 and over, adult: Secondary | ICD-10-CM | POA: Diagnosis not present

## 2017-01-21 NOTE — Procedures (Signed)
Patient Name: Joshua [name], Joshua [name] Date: 01/07/2017 Gender: Male D.O.B: 1956/03/24 Age (years): 57 Referring Provider: [name] MD, ABSM Height (inches): 34 Interpreting Physician: [name] MD, ABSM Weight (lbs): 309 RPSGT: Laren Everts BMI: 40 MRN: [id number] Neck Size: 18.00  CLINICAL INFORMATION Sleep Study Type: Split Night CPAP  Indication for sleep study: Congestive Heart Failure, Diabetes, Fatigue, Hypertension, Obesity, OSA, Re-Evaluation, Snoring, Witnessed Apneas  Epworth Sleepiness Score: 8  SLEEP STUDY TECHNIQUE As per the AASM Manual for the Scoring of Sleep and Associated Events v2.3 (April 2016) with a hypopnea requiring 4% desaturations.  The channels recorded and monitored were frontal, central and occipital EEG, electrooculogram (EOG), submentalis EMG (chin), nasal and oral airflow, thoracic and abdominal wall motion, anterior tibialis EMG, snore microphone, electrocardiogram, and pulse oximetry. Continuous positive airway pressure (CPAP) was initiated when the patient met split night criteria and was titrated according to treat sleep-disordered breathing.  MEDICATIONS acetaminophen (TYLENOL) 325 MG tablet allopurinol (ZYLOPRIM) 300 MG tablet amLODipine (NORVASC) 10 MG tablet aspirin EC 81 MG tablet hydrALAZINE (APRESOLINE) 50 MG tablet insulin NPH Human (HUMULIN N,NOVOLIN N) 100 UNIT/ML injection isosorbide mononitrate (IMDUR) 30 MG 24 hr tablet metoprolol succinate (TOPROL-XL) 100 MG 24 hr tablet nitroGLYCERIN (NITROSTAT) 0.4 MG SL tablet NOVOLIN R 100 UNIT/ML injection pantoprazole (PROTONIX) 40 MG tablet rosuvastatin (CRESTOR) 40 MG tablet ticagrelor (BRILINTA) 90 MG TABS tablet  Medications self-administered by patient taken the night of the study : N/A  RESPIRATORY PARAMETERS Diagnostic Total AHI (/hr): 41.6 RDI (/hr): 47.6 OA Index (/hr): 18.2 CA Index (/hr): 0.5 REM AHI (/hr): N/A NREM AHI (/hr): 41.6 Supine AHI  (/hr): N/A Non-supine AHI (/hr): 41.56 Min O2 Sat (%): 88.00 Mean O2 (%): 93.37 Time below 88% (min): 0.1    Titration Optimal Pressure (cm): 17 AHI at Optimal Pressure (/hr): 0.0 Min O2 at Optimal Pressure (%): 97.0 Supine % at Optimal (%): 100 Sleep % at Optimal (%): 84    SLEEP ARCHITECTURE The recording time for the entire night was 391.7 minutes.  During a baseline period of 176.6 minutes, the patient slept for 128.5 minutes in REM and nonREM, yielding a sleep efficiency of 72.8%. Sleep onset after lights out was 20.0 minutes with a REM latency of N/A minutes. The patient spent 18.29% of the night in stage N1 sleep, 81.71% in stage N2 sleep, 0.00% in stage N3 and 0.00% in REM.  During the titration period of 204.9 minutes, the patient slept for 184.0 minutes in REM and nonREM, yielding a sleep efficiency of 89.8%. Sleep onset after CPAP initiation was 4.2 minutes with a REM latency of 50.5 minutes. The patient spent 3.26% of the night in stage N1 sleep, 64.13% in stage N2 sleep, 0.00% in stage N3 and 32.61% in REM.  CARDIAC DATA The 2 lead EKG demonstrated sinus rhythm. The mean heart rate was 64.64 beats per minute. Other EKG findings include: PVCs.  LEG MOVEMENT DATA The total Periodic Limb Movements of Sleep (PLMS) were 131. The PLMS index was 25.15 .  IMPRESSIONS - Severe obstructive sleep apnea occurred during the diagnostic portion of the study (AHI = 41.6/hour). An optimal PAP pressure was selected for this patient ( 17 cm of water) - No significant central sleep apnea occurred during the diagnostic portion of the study (CAI = 0.5/hour). - Severe oxygen desaturation to a nadir of 77%. - Absence of REM slep on the diagnostic portion of the study. - The patient snored with Moderate snoring volume during the diagnostic portion  of the study. - EKG findings include PVCs. - Periodic limb movements during sleep with an index of 25.15.  DIAGNOSIS - Obstructive Sleep Apnea (327.23  [G47.33 ICD-10])  RECOMMENDATIONS - Recommend an initial trial of CPAP therapy with C/Flex/EPR of 3 at 17cm H2O with heated humidification. A Large size Fisher&Paykel Full Face Mask Simplus mask was used for the titration.  - Eforts should be made to optimize nasal and oropharyngeal patency. - Avoid alcohol, sedatives and other CNS depressants that may worsen sleep apnea and disrupt normal sleep architecture. - Sleep hygiene should be reviewed to assess factors that may improve sleep quality. - Weight management and regular exercise should be initiated or continued. - Return to Sleep Center for re-evaluation after 4 weeks of therapy  [Electronically signed] 01/21/2017 06:41 PM  [name] MD, [facility], ABSM Diplomate, American Board of Sleep Medicine   NPI: [id number]  [place] PH: [phone]   FX: [phone] [place]

## 2017-02-01 ENCOUNTER — Telehealth: Payer: Self-pay | Admitting: *Deleted

## 2017-02-01 NOTE — Telephone Encounter (Signed)
Left message on voice mail ( ok per DPR) sleep study positive for OSA. I will be sending information to [facility] for CPAP setup. He can call back if he has any questions or concerns.

## 2017-02-01 NOTE — Telephone Encounter (Signed)
-----   Message from [name], MD sent at 01/21/2017  6:46 PM EST ----- [name], please set up with DME for CPAP setup and f/u sleep clinic

## 2017-02-04 NOTE — Progress Notes (Signed)
Patient referred to [facility] for CPAP set up.

## 2017-02-08 ENCOUNTER — Telehealth: Payer: Self-pay | Admitting: Cardiovascular Disease

## 2017-02-08 MED ORDER — HYDRALAZINE HCL 50 MG PO TABS: 50.0000 mg | ORAL_TABLET | Freq: Three times a day (TID) | ORAL | 11 refills | Status: DC

## 2017-02-08 NOTE — Telephone Encounter (Signed)
New Message   Pt c/o medication issue:  1. Name of Medication:   hydrALAZINE (APRESOLINE) 50 MG tablet   2. How are you currently taking this medication (dosage and times per day)? 3 pills per day  3. Are you having a reaction (difficulty breathing--STAT)? No  4. What is your medication issue? Unsure how he should be taking medication/Requesting phone call from nurse

## 2017-02-08 NOTE — Telephone Encounter (Signed)
Patient wanted to verify he is suppose to be taking hydralazine (50mg ) 3x daily.  Also requesting Rx sent to [facility] instead of [facility].   Rx sent electonically. Pt aware.

## 2017-02-16 ENCOUNTER — Encounter: Payer: Self-pay | Admitting: Cardiovascular Disease

## 2017-02-16 ENCOUNTER — Ambulatory Visit (INDEPENDENT_AMBULATORY_CARE_PROVIDER_SITE_OTHER): Payer: BLUE CROSS/BLUE SHIELD | Admitting: Cardiovascular Disease

## 2017-02-16 VITALS — BP 175/82 | HR 75 | Ht 69.0 in | Wt 313.0 lb

## 2017-02-16 DIAGNOSIS — I110 Hypertensive heart disease with heart failure: Secondary | ICD-10-CM | POA: Diagnosis not present

## 2017-02-16 DIAGNOSIS — N183 Chronic kidney disease, stage 3 unspecified: Secondary | ICD-10-CM

## 2017-02-16 DIAGNOSIS — I2510 Atherosclerotic heart disease of native coronary artery without angina pectoris: Secondary | ICD-10-CM

## 2017-02-16 DIAGNOSIS — Z8739 Personal history of other diseases of the musculoskeletal system and connective tissue: Secondary | ICD-10-CM

## 2017-02-16 DIAGNOSIS — E1142 Type 2 diabetes mellitus with diabetic polyneuropathy: Secondary | ICD-10-CM

## 2017-02-16 DIAGNOSIS — G4733 Obstructive sleep apnea (adult) (pediatric): Secondary | ICD-10-CM

## 2017-02-16 DIAGNOSIS — E785 Hyperlipidemia, unspecified: Secondary | ICD-10-CM | POA: Diagnosis not present

## 2017-02-16 DIAGNOSIS — Z9861 Coronary angioplasty status: Secondary | ICD-10-CM | POA: Diagnosis not present

## 2017-02-16 MED ORDER — METOPROLOL SUCCINATE ER 100 MG PO TB24: ORAL_TABLET | ORAL | 3 refills | Status: DC

## 2017-02-16 MED ORDER — ISOSORBIDE MONONITRATE ER 120 MG PO TB24: 120.0000 mg | ORAL_TABLET | Freq: Every day | ORAL | 6 refills | Status: DC

## 2017-02-16 NOTE — Patient Instructions (Signed)
Your physician has recommended you make the following change in your medication:   1.) the metoprolol has been increased to 125 mg (1 & 1/4 tablet)  2.) the isosorbide has been increased to 120 mg daily.  CONTACT [facility] TODAY TO GET YOUR CPAP MACHINE.  Your physician recommends that you schedule a follow-up appointment in: 3 months sleep clinic.

## 2017-02-17 NOTE — Progress Notes (Signed)
Patient ID: Joshua [name], male   DOB: 1956/12/12, 61 y.o.   MRN: [id number]      HPI: Joshua [name] is a 61 y.o. male who presents to the office for a 3 month follow-up cardiology evaluation.  Joshua [name] suffered an acute coronary syndrome on 10/31/2011 and presented with ECG findings of anterolateral ST segment elevation. Cardiac catheterization revealed a 99% mid LAD stenosis and proximal to this there was a 60-70% stenosis after the first diagonal vessel. He also had 90-95% mid RCA stenosis and 30% circumflex stenosis. He underwent initial acute intervention to his LAD and a 3.0x32 mm Promus DES stent was inserted. 3 days later he underwent successful staged PCI to his 95% RCA stenosis and a 3.0x18 mm Resolute DES stent was inserted and post dilated 3.5 mm.   Additional problems include at least a >30 year history of hypertension, a 25 year history of diabetes mellitus, obesity, as well as hyperlipidemia.  He was admitted to the hospital on 07/29/2015 with complaints of shortness of breath similar to his initial symptomatology.  He had mild renal insufficiency, was hydrated and underwent cardiac catheterization by Dr. Julianne Handler in 07/30/2015.  His LAD stents were patent.  There was mild 40% first agonal stenosis, 60% circumflex stenosis, and his RCA stents had a 50% intimal narrowing in its mid stent without significant narrowing in the distal stent.  An echo Doppler study on 07/31/2015 showed an EF of 45-50% with moderate left ventricular hypertrophy and grade 1 diastolic dysfunction.   He was seen by Joshua [name] on 08/08/2015 for initial posthospital evaluation.  Since that time, he has had some vague symptoms of GERD.  He is not on any proton pump inhibitor.  He gets shortness of breath with minimal activity including brushing his teeth and taking a shower.  He has not been routinely exercising.  He has obstructive sleep apnea and uses CPAP therapy.  He denies residual snoring.  He works at the  [employer] in the night shift.  At times he feels that some of his food may get stuck when swallowing but this is not consistent.    When I saw him in September 2016, I started him on Protonix for significant GERD symptoms which has improved.  We discussed weight loss and exercise. Laboratory showed worsening renal function with a creatinine of 1.84, significant glucose elevation at 669 with probable factitious hyponatremia at 129, due to high glucose levels.  His hemoglobin A1c was 14.2.  I referred him to Dr. Michiel Sites for endocrinologic evaluation. I also discontinued his spironolactone and added hydralazine 25 mg twice a day for more optimal blood pressure control in light of his renal insufficiency.  We discussed the importance of weight loss.  When I last saw him in the office in March 2017, he has been in a cast and boot in his right leg due to prior infection of his right toe. He did undergo hyperbaric chamber therapy.  He tells me he was initially told of having borderline osteomyelitis. Four weeks ago his cultures were negative.  He subsequently required right metatarsal amputation for osteomyelitis in August 2017.  In August he was admitted to [facility] with unstable angina and ruled in for a non-STEM>  Repeat catheterization showed new eccentric proximal LAD stenosis, not involving the previously placed overlapping mid LAD stents, eccentric 75% mid left circumflex stenosis between OM1 and OM 2, and progression of OM1 stenosis to 95%.  He ultimately underwent stenting  to his LAD and circumflex marginal vessel with Synergy stents.  He was readmitted in October 2017 with recurrent chest pain which was felt to be atypical and musculoskeletal.  A 2 day protocol nuclear stress test showed an EF of 39% without ischemia.  He was seen in office follow-up by Joshua [name], Joshua [name] on 10/22/2016.   When I saw him 3 months ago, he denied any recurrent episodes of chest pain.  His sleep was poor and he  felt tired.  Over 10 years ago he was evaluated for sleep apnea but had only used CPAP for 6 months.  I referred him for a sleep study which was done on 01/07/2017.  Revealed severe obstructive sleep apnea with an AHI of 41.6/h.  He had severe oxygen desaturation to a nadir of 77%.  He had increased periodic limb movements with an index of 25.15.  On the diagnostic portion of the study there was absence of REM sleep. During the  CPAP titration there was evidence for RAM rebound, such that he slept 32.6% in REM sleep.  The DME company has contacted him, but he has not been available and has not been set up with CPAP at home yet.  He has noticed some twinges of vague chest pain which are nonexertional and different from his anginal type symptoms.  He also admits to occasional feet swelling.  Past Medical History:  Diagnosis Date  . Chronic combined systolic and diastolic CHF (congestive heart failure) ([place])    a. 07/2015 Echo: EF 45-50%, mod LVH, mid apical/antsept AK, Gr 1 DD.  [name] CKD (chronic kidney disease), stage III   . Coronary artery disease    a. 2012 s/p MI/PCI: s/p DES to mid/dist RCA and overlapping DES to LAD;  b. 07/2015 Cath: LAD 10% ISR, D1 40(jailed), LCX 68m OM2 30, OM3 30, RCA 547mSR, 10d ISR; c. 07/2016 NSTEMI/PCI: LAD 85ost/p/m (3.5x20 Synergy DES overlapping prior stent), D1 40, LCX 751mM2 95 (2.75x16 Synergy DES), OM3 30, RCA 43m13md.  . Depression   . Diabetic gastroparesis (HCC)[place]. GERD (gastroesophageal reflux disease)   . Gout   . Gout   . Heart murmur   . Hyperlipemia   . Hypertensive heart disease   . Ischemic cardiomyopathy    a. 07/2015 Echo: EF 45-50%, mod LVH, mid-apicalanteroseptal AK, Gr1 DD.  . Morbid obesity (HCC)[place]. Myocardial infarction 2012  . OSA on CPAP   . Osteomyelitis (HCC)[place] a. 07/2016 MTP joint of R great toe s/p transmetatarsal amputation.  . Polyneuropathy in diabetes(357.2)   . Pulmonary sarcoidosis (HCC)[place] "problems w/it years ago; no  problems anymore" (07/03/2014)  . Type II diabetes mellitus (HCC)[place]  Past Surgical History:  Procedure Laterality Date  . AMPUTATION Right 07/24/2016   Procedure: TRANSMETATARSAL FOOT AMPUTATION;  Surgeon: Joshua [name];  [facility];  Laterality: Right;  . BREAST LUMPECTOMY Right ~ 2000   "benign"  . CARDIAC CATHETERIZATION N/A 07/30/2015   Procedure: Left Heart Cath and Coronary Angiography;  Surgeon: Joshua [name];  [facility];  Service: Cardiovascular;  Laterality: N/A;  . CARDIAC CATHETERIZATION N/A 07/19/2016   Procedure: Left Heart Cath and Coronary Angiography;  Surgeon: Joshua [name];  [facility];  Service: Cardiovascular;  Laterality: N/A;  . CARDIAC CATHETERIZATION N/A 07/29/2016   Procedure: Coronary Stent Intervention;  Surgeon: Joshua Crome  [name];  Location: [place] [facility] LAB;  Service: Cardiovascular;  Laterality: N/A;  . CARDIAC CATHETERIZATION N/A 07/29/2016   Procedure: Intravascular Ultrasound/IVUS;  Surgeon: Joshua Crome, [name];  Location: [place] [facility];  Service: Cardiovascular;  Laterality: N/A;  . CATARACT EXTRACTION W/ INTRAOCULAR LENS  IMPLANT, BILATERAL Bilateral 2014-2015  . CORONARY ANGIOPLASTY WITH STENT PLACEMENT  10/31/2011   30 % MID ATRIOVENTRICULAR GROOVE CX STENOSIS. 90 % MID RCA.PTA TO LAD/STENTING OF TANDEM 60-70%. LAD STENOSIS 99% REDUCED TO 0%.3.0 X 32MM PROMUS DES POSTDILATED TO 3.25MM.  [name] CORONARY ANGIOPLASTY WITH STENT PLACEMENT  07/03/2014   "2"  . I&D EXTREMITY Right 10/30/2013   Procedure: IRRIGATION AND DEBRIDEMENT RIGHT SMALL FINGER POSSIBLE SECONDARY WOUND CLOSURE;  Surgeon: Joshua Amor, [name];  Location: [facility];  Service: Orthopedics;  Laterality: Right;  Joshua [name] is available after 4pm  . I&D EXTREMITY Right 11/01/2013   Procedure:  IRRIGATION AND DEBRIDEMENT RIGHT  PROXIMAL PHALANGEAL LEVEL AMPUTATION;  Surgeon: Joshua Amor, [name];  Location: [facility];   Service: Orthopedics;  Laterality: Right;  . INCISION AND DRAINAGE Right 10/28/2013   Procedure: INCISION AND DRAINAGE Right small finger;  Surgeon: Joshua Amor, [name];  Location: [facility];  Service: Orthopedics;  Laterality: Right;  . LEFT HEART CATH Left 11/03/2011   Procedure: LEFT HEART CATH;  Surgeon: Joshua Sine, [name];  Location: [facility] CATH LAB;  Service: Cardiovascular;  Laterality: Left;  . LEFT HEART CATHETERIZATION WITH CORONARY ANGIOGRAM N/A 07/03/2014   Procedure: LEFT HEART CATHETERIZATION WITH CORONARY ANGIOGRAM;  Surgeon: [name], [name];  Location: [facility] CATH LAB;  Service: Cardiovascular;  Laterality: N/A;  . PERCUTANEOUS CORONARY STENT INTERVENTION (PCI-S) Left 11/03/2011   Procedure: PERCUTANEOUS CORONARY STENT INTERVENTION (PCI-S);  Surgeon: Joshua Sine, [name];  Location: [facility] CATH LAB;  Service: Cardiovascular;  Laterality: Left;  . TOE AMPUTATION Right ~ 2010   "2nd toe"  . TOE AMPUTATION Right 2012   "3rd toe"    No Known Allergies  Current Outpatient Prescriptions  Medication Sig Dispense Refill  . acetaminophen (TYLENOL) 325 MG tablet Take 2 tablets (650 mg total) by mouth every 4 (four) hours as needed for headache or mild pain.    [name] allopurinol (ZYLOPRIM) 300 MG tablet Take 1 tablet (300 mg total) by mouth daily. 30 tablet 0  . amLODipine (NORVASC) 10 MG tablet Take 1 tablet (10 mg total) by mouth daily. 30 tablet 0  . aspirin EC 81 MG tablet Take 81 mg by mouth daily.    . Cholecalciferol (CVS VIT D 5000 HIGH-POTENCY) 5000 units capsule Take 5,000 Units by mouth daily.    . hydrALAZINE (APRESOLINE) 50 MG tablet Take 1 tablet (50 mg total) by mouth every 8 (eight) hours. 90 tablet 11  . insulin NPH Human (HUMULIN N,NOVOLIN N) 100 UNIT/ML injection Use 20 units in morning and 22 units at bedtime. (Patient taking differently: Inject 20-25 Units into the skin 2 (two) times daily. Use 25 units in morning and 20 units at bedtime.) 10 mL 11  . metoprolol succinate  (TOPROL-XL) 100 MG 24 hr tablet Take 1 & 1/4 tablet daily (125 mg) 90 tablet 3  . nitroGLYCERIN (NITROSTAT) 0.4 MG SL tablet Place 1 tablet (0.4 mg total) under the tongue every 5 (five) minutes as needed for chest pain. 25 tablet 3  . NOVOLIN R 100 UNIT/ML injection Sliding scale as directed 70-150 = 3 units 151-200 =5 units 201-250 =7 units 251-300 =9 units >300 = 12 units  5  . pantoprazole (PROTONIX) 40 MG tablet Take 1 tablet (40 mg total) by mouth daily. 30 tablet 0  . rosuvastatin (CRESTOR) 40 MG tablet Take 1 tablet (40 mg total) by mouth daily at 6 PM. 30 tablet 0  . ticagrelor (BRILINTA) 90 MG TABS tablet Take 1 tablet (90 mg total) by mouth 2 (two) times daily. 60 tablet 0  . Vitamin D, Ergocalciferol, (DRISDOL) 50000 units CAPS capsule Take 50,000 Units by mouth every 7 (seven) days.    . isosorbide mononitrate (IMDUR) 120 MG 24 hr tablet Take 1 tablet (120 mg total) by mouth daily. 30 tablet 6   No current facility-administered medications for this visit.     Social History   Social History  . Marital status: Married    Spouse name: N/A  . Number of children: N/A  . Years of education: N/A   Occupational History  . Not on file.   Social History Main Topics  . Smoking status: Never Smoker  . Smokeless tobacco: Never Used  . Alcohol use No     Comment: none since 07/2016  . Drug use: No  . Sexual activity: Yes   Other Topics Concern  . Not on file   Social History Narrative  . No narrative on file    Family History  Problem Relation Age of Onset  . Heart attack Maternal Aunt   . Diabetes Maternal Grandmother   . Hypertension Maternal Grandmother     ROS General: Negative; No fevers, chills, or night sweats;  HEENT: Negative; No changes in vision or hearing, sinus congestion, difficulty swallowing Pulmonary: Negative; No cough, wheezing, shortness of breath, hemoptysis Cardiovascular: Negative; No chest pain, presyncope, syncope, palpitations GI:  Negative; No nausea, vomiting, diarrhea, or abdominal pain GU: Negative; No dysuria, hematuria, or difficulty voiding Musculoskeletal: Negative; no myalgias, joint pain, or weakness Hematologic/Oncology: Negative; no easy bruising, bleeding Endocrine: Negative; no heat/cold intolerance; no diabetes Neuro: Negative; no changes in balance, headaches Skin: Negative; No rashes or skin lesions Psychiatric: Negative; No behavioral problems, depression Sleep: Positive for obstructive sleep apnea not on CPAP therapy;  snoring, daytime sleepiness, hypersomnolence, no bruxism, restless legs, hypnogognic hallucinations, no cataplexy Other comprehensive 14 point system review is negative.   PE BP (!) 175/82 (BP Location: Right Arm, Patient Position: Sitting, Cuff Size: Large)   Pulse 75   Ht '5\' 9"'  (1.753 m)   Wt (!) 313 lb (142 kg)   BMI 46.22 kg/m   Repeat blood pressure by me was 164/84  Wt Readings from Last 3 Encounters:  02/16/17 (!) 313 lb (142 kg)  01/07/17 (!) 309 lb (140.2 kg)  11/17/16 (!) 307 lb 4 oz (139.4 kg)   General: Alert, oriented, no distress.  Skin: normal turgor, no rashes HEENT: Normocephalic, atraumatic. Pupils round and reactive; sclera anicteric;no lid lag.  Nose without nasal septal hypertrophy Mouth/Parynx benign; Mallinpatti scale 3/4 Neck: Very thick neck; No JVD, no carotid bruits with normal carotid upstroke Lungs: clear to ausculatation and percussion; no wheezing or rales Chest wall: Nontender to palpation Heart: RRR, s1 s2 normal 1/6 systolic murmur, unchanged.  No S3 gallop.  No rubs thrills or heaves Abdomen: Moderate central adiposity. soft, nontender; no hepatosplenomehaly, BS+; abdominal aorta nontender and not dilated by palpation. Back: No CVA tenderness Pulses 2+ Extremities:  Right metatarsal amputation; Homan's sign negative  Neurologic: grossly nonfocal Psychologic: normal affect and mood.  ECG (independently read by me): Normal sinus rhythm  at 75 bpm.  No ectopy.  Q wave in lead 3.  December 2017 ECG (independently read by me): normal sinus rhythm at 68 bpm.  First-degree AV block with a PR interval 212 ms.  No significant ST changes.  March ECG (independently read by me):   Normal sinus rhythm at 80 bpm..  No ectopy.  Borderline left atrial enlargement.  November 2016 ECG (independently read by me):  Normal sinus rhythm at 77 bpm.  Poor R wave progression anteriorly.  QTc interval 425 ms. Borderline first-degree AV block.  ECG (independently read by me): Sinus rhythm at 85 bpm.  Early repolarization changes anterolaterally.  Prior 2014 ECG: Normal sinus rhythm at 90 beats per minute. Poor progression V1 through V4. Normal intervals.  LABS: BMP Latest Ref Rng & Units 11/17/2016 11/01/2016 10/13/2016  Glucose 65 - 99 mg/dL 177(H) 132(H) 136(H)  BUN 7 - 25 mg/dL 26(H) 24 17  Creatinine 0.70 - 1.33 mg/dL 2.06(H) 1.50(H) 1.38(H)  Sodium 135 - 146 mmol/L 139 141 141  Potassium 3.5 - 5.3 mmol/L 3.9 3.8 3.6  Chloride 98 - 110 mmol/L 105 108 110  CO2 20 - 31 mmol/L '22 26 25  ' Calcium 8.6 - 10.3 mg/dL 8.7 8.8 8.7(L)   Hepatic Function Latest Ref Rng & Units 11/17/2016 08/04/2016 02/16/2016  Total Protein 6.1 - 8.1 g/dL 6.6 7.3 6.9  Albumin 3.6 - 5.1 g/dL 3.4(L) 2.3(L) 3.5(L)  AST 10 - 35 U/L '15 17 20  ' ALT 9 - 46 U/L 16 16(L) 23  Alk Phosphatase 40 - 115 U/L 140(H) 114 131(H)  Total Bilirubin 0.2 - 1.2 mg/dL 0.3 0.3 0.4   CBC Latest Ref Rng & Units 11/17/2016 10/13/2016 10/11/2016  WBC 3.8 - 10.8 K/uL 7.3 5.7 6.0  Hemoglobin 13.2 - 17.1 g/dL 12.3(L) 11.2(L) 10.6(L)  Hematocrit 38.5 - 50.0 % 39.3 36.0(L) 33.5(L)  Platelets 140 - 400 K/uL 207 205 218   Lab Results  Component Value Date   MCV 84.5 11/17/2016   MCV 85.3 10/13/2016   MCV 84.0 10/11/2016   Lab Results  Component Value Date   TSH 2.15 11/17/2016   Lab Results  Component Value Date   HGBA1C 8.5 (H) 11/17/2016   Lipid Panel     Component Value Date/Time   CHOL  158 11/17/2016 0921   TRIG 116 11/17/2016 0921   HDL 74 11/17/2016 0921   CHOLHDL 2.1 11/17/2016 0921   VLDL 23 11/17/2016 0921   LDLCALC 61 11/17/2016 0921      RADIOLOGY: Dg Chest 2 View  10/28/2013   CLINICAL DATA:  Shortness of breath  EXAM: CHEST  2 VIEW  COMPARISON:  10/31/2011  FINDINGS: Mild cardiomegaly, stable from prior. Kerley A and B lines noted. No significant effusion. No asymmetric airspace opacity. No acute osseous findings.  IMPRESSION: Mild interstitial pulmonary edema.   Electronically Signed   By: Jorje Guild M.D.   On: 10/28/2013 23:01   Dg Hand Complete Right  10/28/2013   CLINICAL DATA:  Little finger laceration with pain  EXAM: RIGHT HAND - COMPLETE 3+ VIEW  COMPARISON:  None.  FINDINGS: There is extensive soft tissue swelling of the 5th digit, extending into the dorsal hand. No radiodense foreign body. No osseous erosion or asymmetric joint narrowing to suggest osseous/joint infection. No acute fracture or malalignment.  Remote ulnar and radial styloid process fractures.  Degenerative interphalangeal joint narrowing throughout the digits.  IMPRESSION: No evidence of osseous infection.  No radiodense foreign body.   Electronically Signed   By: Jorje Guild  M.D.   On: 10/28/2013 23:04    IMPRESSION:  1. Hypertensive heart disease with heart failure ([place])   2. CAD S/P multiple PCIs   3. OSA (obstructive sleep apnea)   4. Hyperlipidemia, unspecified hyperlipidemia type   5. DM type 2 with diabetic peripheral neuropathy ([place])   6. CKD (chronic kidney disease), stage III   7. Morbid obesity ([place])   8. History of gout     ASSESSMENT AND PLAN: Mr. Terius Jacuinde is a 61 year old African-American male who has a history of morbid obesity and suffered an acute coronary syndrome/ anterolateral ST segment elevation myocardial infarction in November 2012. At that time he underwent stenting to his subtotaled LAD stenosis and 3 days later underwent staged intervention  to a high grade mid right coronary artery stenosis. A nuclear perfusion study in January 2013 was low risk and did show small area of anteroseptal scar towards the apex without associated ischemia.  Due to osteomyelitis he underwent right metatarsal amputation in August 2017.  Subsequently, he developed chest pain and ruled in with a NSTEMI.  Repeat catheterization he was found to have progressive proximal LAD disease and circumflex marginal disease and underwent two-vessel PCI.  Subsequently, he developed atypical chest pain which was described as more of a piercing sensation.  A nuclear study did not reveal new ischemia in October 2017.  Last saw him, he was significantly hypertensive and I further titrated hydralazine 50 mg every 8 hours and he continues to be on amlodipine 10 mg daily in addition to Toprol-XL 100 mg and isosorbide 90 mg.  He continues to be on dual antiplatelet therapy with aspirin and Brilinta and denies bleeding.  A pressure continues to be elevated.  I have recommended further titration of Toprol-XL to 125 mg daily  and will increase isosorbide to 120 mg in the morning.  I reviewed his sleep study with him in detail.  He has severe sleep apnea.  We have contacted the DME company and they stated that they tried to initiate therapy.  Last week, but the patient never responded back.  We will try to get CPAP initiated as soon as possible since this undoubtedly is contributed to his blood pressure elevation since his severe sleep apnea is currently untreated.  His GERD is controlled with pantoprazole.  He is diabetic and continues to take insulin.  He has not had recent gout episodes and continues to take allopurinol.  He is morbidly obese with a BMI of 46.22.  Weight reduction and exercise was strongly recommended.  I also discussed the negative consequences of his weight and its effect on his sleep apnea.I will see him before 3 months for reevaluation and for compliance assessment following  initiation of CPAP therapy.  Time spent: 25 minutes  Joshua Sine, [name], [facility]  02/18/2017 7:50 PM

## 2017-02-22 ENCOUNTER — Other Ambulatory Visit: Payer: Self-pay

## 2017-02-22 MED ORDER — METOPROLOL SUCCINATE ER 50 MG PO TB24: ORAL_TABLET | ORAL | 3 refills | Status: DC

## 2017-02-24 ENCOUNTER — Other Ambulatory Visit: Payer: Self-pay | Admitting: Cardiovascular Disease

## 2017-02-24 NOTE — Telephone Encounter (Signed)
Rx(s) sent to pharmacy electronically.  

## 2017-03-06 ENCOUNTER — Other Ambulatory Visit: Payer: Self-pay | Admitting: Nurse Practitioner

## 2017-03-17 ENCOUNTER — Other Ambulatory Visit: Payer: Self-pay | Admitting: Nurse Practitioner

## 2017-03-17 NOTE — Telephone Encounter (Signed)
REFILL 

## 2017-03-23 ENCOUNTER — Other Ambulatory Visit: Payer: Self-pay | Admitting: Nurse Practitioner

## 2017-03-23 NOTE — Telephone Encounter (Signed)
Rx(s) sent to pharmacy electronically.  

## 2017-04-06 ENCOUNTER — Ambulatory Visit (INDEPENDENT_AMBULATORY_CARE_PROVIDER_SITE_OTHER): Payer: BLUE CROSS/BLUE SHIELD | Admitting: Cardiovascular Disease

## 2017-04-06 ENCOUNTER — Encounter: Payer: Self-pay | Admitting: Cardiovascular Disease

## 2017-04-06 VITALS — BP 174/82 | HR 78 | Ht 69.0 in | Wt 328.4 lb

## 2017-04-06 DIAGNOSIS — G4733 Obstructive sleep apnea (adult) (pediatric): Secondary | ICD-10-CM | POA: Diagnosis not present

## 2017-04-06 DIAGNOSIS — E1142 Type 2 diabetes mellitus with diabetic polyneuropathy: Secondary | ICD-10-CM

## 2017-04-06 DIAGNOSIS — I2510 Atherosclerotic heart disease of native coronary artery without angina pectoris: Secondary | ICD-10-CM

## 2017-04-06 DIAGNOSIS — E785 Hyperlipidemia, unspecified: Secondary | ICD-10-CM

## 2017-04-06 DIAGNOSIS — I10 Essential (primary) hypertension: Secondary | ICD-10-CM | POA: Diagnosis not present

## 2017-04-06 DIAGNOSIS — N183 Chronic kidney disease, stage 3 unspecified: Secondary | ICD-10-CM

## 2017-04-06 DIAGNOSIS — Z9861 Coronary angioplasty status: Secondary | ICD-10-CM

## 2017-04-06 DIAGNOSIS — I110 Hypertensive heart disease with heart failure: Secondary | ICD-10-CM

## 2017-04-06 MED ORDER — HYDRALAZINE HCL 100 MG PO TABS: 100.0000 mg | ORAL_TABLET | Freq: Two times a day (BID) | ORAL | 6 refills | Status: DC

## 2017-04-06 MED ORDER — METOPROLOL SUCCINATE ER 100 MG PO TB24: ORAL_TABLET | ORAL | 6 refills | Status: DC

## 2017-04-06 NOTE — Progress Notes (Signed)
Patient ID: BOSS DANIELSEN, male   DOB: 1956-01-19, 61 y.o.   MRN: [id number]      HPI: KARIEM WOLFSON is a 61 y.o. male who presents to the office for a 7 week  follow-up cardiology evaluation.  [name] suffered an acute coronary syndrome on 10/31/2011 and presented with ECG findings of anterolateral ST segment elevation. Cardiac catheterization revealed a 99% mid LAD stenosis and proximal to this there was a 60-70% stenosis after the first diagonal vessel. He also had 90-95% mid RCA stenosis and 30% circumflex stenosis. He underwent initial acute intervention to his LAD and a 3.0x32 mm Promus DES stent was inserted. 3 days later he underwent successful staged PCI to his 95% RCA stenosis and a 3.0x18 mm Resolute DES stent was inserted and post dilated 3.5 mm.   Additional problems include at least a >30 year history of hypertension, a 25 year history of diabetes mellitus, obesity, as well as hyperlipidemia.  He was admitted to the hospital on 07/29/2015 with complaints of shortness of breath similar to his initial symptomatology.  He had mild renal insufficiency, was hydrated and underwent cardiac catheterization by Dr. Julianne Handler in 07/30/2015.  His LAD stents were patent.  There was mild 40% first agonal stenosis, 60% circumflex stenosis, and his RCA stents had a 50% intimal narrowing in its mid stent without significant narrowing in the distal stent.  An echo Doppler study on 07/31/2015 showed an EF of 45-50% with moderate left ventricular hypertrophy and grade 1 diastolic dysfunction.   He was seen by Jorja Loa on 08/08/2015 for initial posthospital evaluation.  Since that time, he has had some vague symptoms of GERD.  He is not on any proton pump inhibitor.  He gets shortness of breath with minimal activity including brushing his teeth and taking a shower.  He has not been routinely exercising.  He has obstructive sleep apnea and uses CPAP therapy.  He denies residual snoring.  He works at the  [employer] in the night shift.  At times he feels that some of his food may get stuck when swallowing but this is not consistent.    When I saw him in September 2016, I started him on Protonix for significant GERD symptoms which has improved.  We discussed weight loss and exercise. Laboratory showed worsening renal function with a creatinine of 1.84, significant glucose elevation at 669 with probable factitious hyponatremia at 129, due to high glucose levels.  His hemoglobin A1c was 14.2.  I referred him to Dr. Michiel Sites for endocrinologic evaluation. I also discontinued his spironolactone and added hydralazine 25 mg twice a day for more optimal blood pressure control in light of his renal insufficiency.  We discussed the importance of weight loss.  When I saw him in the office in March 2017, he has been in a cast and boot in his right leg due to prior infection of his right toe. He did undergo hyperbaric chamber therapy.  He tells me he was initially told of having borderline osteomyelitis. Four weeks ago his cultures were negative.  He subsequently required right metatarsal amputation for osteomyelitis in August 2017.  In August he was admitted to [facility] with unstable angina and ruled in for a non-STEM>  Repeat catheterization showed new eccentric proximal LAD stenosis, not involving the previously placed overlapping mid LAD stents, eccentric 75% mid left circumflex stenosis between OM1 and OM 2, and progression of OM1 stenosis to 95%.  He ultimately underwent stenting  to his LAD and circumflex marginal vessel with Synergy stents.  He was readmitted in October 2017 with recurrent chest pain which was felt to be atypical and musculoskeletal.  A 2 day protocol nuclear stress test showed an EF of 39% without ischemia.  He was seen in office follow-up by Almyra Deforest, [place] on 10/22/2016.   When I saw him in December 2017, he denied any recurrent episodes of chest pain.  His sleep was poor and he  felt tired.  Over 10 years ago he was evaluated for sleep apnea but had only used CPAP for 6 months.  I referred him for a sleep study which was done on 01/07/2017.  Revealed severe obstructive sleep apnea with an AHI of 41.6/h.  He had severe oxygen desaturation to a nadir of 77%.  He had increased periodic limb movements with an index of 25.15.  On the diagnostic portion of the study there was absence of REM sleep. During the  CPAP titration there was evidence for RAM rebound, such that he slept 32.6% in REM sleep.    Since I last saw him, he has been on CPAP therapy.  Over the past 6 weeks.  He states he feels significantly improved in that he is sleeping much better.  I obtained a download of his CPAP unit today from 03/07/2017 through 04/05/2017.  He is 100% compliant with usage stays and usage greater than 4 hours and is averaging 8 hours and 4 minutes of sleep per night.  He is set at a 17 cm pressure his AHI is excellent at 1.5.  He does have a moderate leak.  He uses a fullface mask.  He denies any chest pain.  He denies palpitations.  He has noticed recently that his blood pressure is elevated.  He presents for follow-up evaluation.  Past Medical History:  Diagnosis Date  . Chronic combined systolic and diastolic CHF (congestive heart failure) ([place])    a. 07/2015 Echo: EF 45-50%, mod LVH, mid apical/antsept AK, Gr 1 DD.  [name] CKD (chronic kidney disease), stage III   . Coronary artery disease    a. 2012 s/p MI/PCI: s/p DES to mid/dist RCA and overlapping DES to LAD;  b. 07/2015 Cath: LAD 10% ISR, D1 40(jailed), LCX 46m OM2 30, OM3 30, RCA 515mSR, 10d ISR; c. 07/2016 NSTEMI/PCI: LAD 85ost/p/m (3.5x20 Synergy DES overlapping prior stent), D1 40, LCX 7559mM2 95 (2.75x16 Synergy DES), OM3 30, RCA 52m29md.  . Depression   . Diabetic gastroparesis (HCC)[place]. GERD (gastroesophageal reflux disease)   . Gout   . Gout   . Heart murmur   . Hyperlipemia   . Hypertensive heart disease   . Ischemic  cardiomyopathy    a. 07/2015 Echo: EF 45-50%, mod LVH, mid-apicalanteroseptal AK, Gr1 DD.  . Morbid obesity (HCC)[place]. Myocardial infarction (HCC)[place]  . OSA on CPAP   . Osteomyelitis (HCC)[place] a. 07/2016 MTP joint of R great toe s/p transmetatarsal amputation.  . Polyneuropathy in diabetes(357.2)   . Pulmonary sarcoidosis (HCC)[place] "problems w/it years ago; no problems anymore" (07/03/2014)  . Type II diabetes mellitus (HCC)[place]  Past Surgical History:  Procedure Laterality Date  . AMPUTATION Right 07/24/2016   Procedure: TRANSMETATARSAL FOOT AMPUTATION;  Surgeon: MarcNewt Minion;  [facility] [place] [place];  Laterality: Right;  . BREAST LUMPECTOMY Right ~ 2000   "benign"  . CARDIAC CATHETERIZATION N/A  07/30/2015   Procedure: Left Heart Cath and Coronary Angiography;  Surgeon: Burnell Blanks, MD;  Location: [place] [facility];  Service: Cardiovascular;  Laterality: N/A;  . CARDIAC CATHETERIZATION N/A 07/19/2016   Procedure: Left Heart Cath and Coronary Angiography;  Surgeon: Belva Crome, MD;  Location: [place] [facility];  Service: Cardiovascular;  Laterality: N/A;  . CARDIAC CATHETERIZATION N/A 07/29/2016   Procedure: Coronary Stent Intervention;  Surgeon: Belva Crome, MD;  Location: [place] [facility];  Service: Cardiovascular;  Laterality: N/A;  . CARDIAC CATHETERIZATION N/A 07/29/2016   Procedure: Intravascular Ultrasound/IVUS;  Surgeon: Belva Crome, MD;  Location: [place] [facility];  Service: Cardiovascular;  Laterality: N/A;  . CATARACT EXTRACTION W/ INTRAOCULAR LENS  IMPLANT, BILATERAL Bilateral 2014-2015  . CORONARY ANGIOPLASTY WITH STENT PLACEMENT  10/31/2011   30 % MID ATRIOVENTRICULAR GROOVE CX STENOSIS. 90 % MID RCA.PTA TO LAD/STENTING OF TANDEM 60-70%. LAD STENOSIS 99% REDUCED TO 0%.3.0 X 32MM PROMUS DES POSTDILATED TO 3.25MM.  [name] CORONARY ANGIOPLASTY WITH STENT PLACEMENT  07/03/2014   "2"  . I&D EXTREMITY Right 10/30/2013   Procedure: IRRIGATION AND  DEBRIDEMENT RIGHT SMALL FINGER POSSIBLE SECONDARY WOUND CLOSURE;  Surgeon: [name], MD;  Location: [facility];  Service: Orthopedics;  Laterality: Right;  [name] is available after 4pm  . I&D EXTREMITY Right 11/01/2013   Procedure:  IRRIGATION AND DEBRIDEMENT RIGHT  PROXIMAL PHALANGEAL LEVEL AMPUTATION;  Surgeon: [name], MD;  Location: [facility];  Service: Orthopedics;  Laterality: Right;  . INCISION AND DRAINAGE Right 10/28/2013   Procedure: INCISION AND DRAINAGE Right small finger;  Surgeon: [name], MD;  Location: [facility];  Service: Orthopedics;  Laterality: Right;  . LEFT HEART CATH Left 11/03/2011   Procedure: LEFT HEART CATH;  Surgeon: [name], MD;  Location: [facility] CATH LAB;  Service: Cardiovascular;  Laterality: Left;  . LEFT HEART CATHETERIZATION WITH CORONARY ANGIOGRAM N/A 07/03/2014   Procedure: LEFT HEART CATHETERIZATION WITH CORONARY ANGIOGRAM;  Surgeon: [name], MD;  [facility] LAB;  Service: Cardiovascular;  Laterality: N/A;  . PERCUTANEOUS CORONARY STENT INTERVENTION (PCI-S) Left 11/03/2011   Procedure: PERCUTANEOUS CORONARY STENT INTERVENTION (PCI-S);  Surgeon: [name], MD;  Location: [facility] CATH LAB;  Service: Cardiovascular;  Laterality: Left;  . TOE AMPUTATION Right ~ 2010   "2nd toe"  . TOE AMPUTATION Right 2012   "3rd toe"    No Known Allergies  Current Outpatient Prescriptions  Medication Sig Dispense Refill  . acetaminophen (TYLENOL) 325 MG tablet Take 2 tablets (650 mg total) by mouth every 4 (four) hours as needed for headache or mild pain.    [name] allopurinol (ZYLOPRIM) 300 MG tablet Take 1 tablet (300 mg total) by mouth daily. 30 tablet 0  . amLODipine (NORVASC) 10 MG tablet TAKE 1 TABLET BY MOUTH EVERY DAY 30 tablet 2  . aspirin EC 81 MG tablet Take 81 mg by mouth daily.    [name] BRILINTA 90 MG TABS tablet TAKE 1 TABLET BY MOUTH TWICE A DAY 60 tablet 11  . Cholecalciferol ([employer] VIT D 5000 HIGH-POTENCY) 5000 units capsule  Take 5,000 Units by mouth daily.    . insulin NPH Human (HUMULIN N,NOVOLIN N) 100 UNIT/ML injection Use 20 units in morning and 22 units at bedtime. (Patient taking differently: Inject 20-25 Units into the skin 2 (two) times daily. Use 25 units in morning and 20 units at bedtime.) 10 mL 11  . isosorbide mononitrate (IMDUR) 120  MG 24 hr tablet Take 1 tablet (120 mg total) by mouth daily. 30 tablet 6  . nitroGLYCERIN (NITROSTAT) 0.4 MG SL tablet Place 1 tablet (0.4 mg total) under the tongue every 5 (five) minutes as needed for chest pain. 25 tablet 3  . NOVOLIN R 100 UNIT/ML injection Sliding scale as directed 70-150 = 3 units 151-200 =5 units 201-250 =7 units 251-300 =9 units >300 = 12 units  5  . pantoprazole (PROTONIX) 40 MG tablet Take 1 tablet (40 mg total) by mouth daily. 30 tablet 0  . rosuvastatin (CRESTOR) 40 MG tablet Take 1 tablet (40 mg total) by mouth daily. 30 tablet 11  . Vitamin D, Ergocalciferol, (DRISDOL) 50000 units CAPS capsule Take 50,000 Units by mouth every 7 (seven) days.    . hydrALAZINE (APRESOLINE) 100 MG tablet Take 1 tablet (100 mg total) by mouth 2 (two) times daily. 60 tablet 6  . metoprolol succinate (TOPROL XL) 100 MG 24 hr tablet Take 1 & 1/2  Tablets daily 45 tablet 6   No current facility-administered medications for this visit.     Social History   Social History  . Marital status: Married    Spouse name: N/A  . Number of children: N/A  . Years of education: N/A   Occupational History  . Not on file.   Social History Main Topics  . Smoking status: Never Smoker  . Smokeless tobacco: Never Used  . Alcohol use No     Comment: none since 07/2016  . Drug use: No  . Sexual activity: Yes   Other Topics Concern  . Not on file   Social History Narrative  . No narrative on file    Family History  Problem Relation Age of Onset  . Heart attack Maternal Aunt   . Diabetes Maternal Grandmother   . Hypertension Maternal Grandmother      ROS General: Negative; No fevers, chills, or night sweats;  HEENT: Negative; No changes in vision or hearing, sinus congestion, difficulty swallowing Pulmonary: Negative; No cough, wheezing, shortness of breath, hemoptysis Cardiovascular: Negative; No chest pain, presyncope, syncope, palpitations GI: Negative; No nausea, vomiting, diarrhea, or abdominal pain GU: Negative; No dysuria, hematuria, or difficulty voiding Musculoskeletal: Negative; no myalgias, joint pain, or weakness Hematologic/Oncology: Negative; no easy bruising, bleeding Endocrine: Negative; no heat/cold intolerance; no diabetes Neuro: Negative; no changes in balance, headaches Skin: Negative; No rashes or skin lesions Psychiatric: Negative; No behavioral problems, depression Sleep: Positive for obstructive sleep apnea not on CPAP therapy;  snoring, daytime sleepiness, hypersomnolence, no bruxism, restless legs, hypnogognic hallucinations, no cataplexy Other comprehensive 14 point system review is negative.   PE BP (!) 174/82   Pulse 78   Ht '5\' 9"'  (1.753 m)   Wt (!) 328 lb 6.4 oz (149 kg)   BMI 48.50 kg/m    Repeat blood pressure by me was 175/85  Wt Readings from Last 3 Encounters:  04/06/17 (!) 328 lb 6.4 oz (149 kg)  02/16/17 (!) 313 lb (142 kg)  01/07/17 (!) 309 lb (140.2 kg)   General: Alert, oriented, no distress.  Skin: normal turgor, no rashes HEENT: Normocephalic, atraumatic. Pupils round and reactive; sclera anicteric;no lid lag.  Nose without nasal septal hypertrophy Mouth/Parynx benign; Mallinpatti scale 3/4 Neck: Very thick neck; No JVD, no carotid bruits with normal carotid upstroke Lungs: clear to ausculatation and percussion; no wheezing or rales Chest wall: Nontender to palpation Heart: RRR, s1 s2 normal 1/6 systolic murmur, unchanged.  No S3 gallop.  No rubs thrills or heaves Abdomen: Moderate central adiposity. soft, nontender; no hepatosplenomehaly, BS+; abdominal aorta nontender and  not dilated by palpation. Back: No CVA tenderness Pulses 2+ Extremities:  Right metatarsal amputation; Homan's sign negative  .  Trace to 1+ edema, right ankle, trivial left ankle Neurologic: grossly nonfocal Psychologic: normal affect and mood.  ECG (independently read by me): Normal sinus rhythm at 78 bpm.  First degree AV block with a PR interval of 206 msec.  QS complex V1 through V4  02/17/2017 ECG (independently read by me): Normal sinus rhythm at 75 bpm.  No ectopy.  Q wave in lead 3.  December 2017 ECG (independently read by me): normal sinus rhythm at 68 bpm.  First-degree AV block with a PR interval 212 ms.  No significant ST changes.  March ECG (independently read by me):   Normal sinus rhythm at 80 bpm..  No ectopy.  Borderline left atrial enlargement.  November 2016 ECG (independently read by me):  Normal sinus rhythm at 77 bpm.  Poor R wave progression anteriorly.  QTc interval 425 ms. Borderline first-degree AV block.  ECG (independently read by me): Sinus rhythm at 85 bpm.  Early repolarization changes anterolaterally.  Prior 2014 ECG: Normal sinus rhythm at 90 beats per minute. Poor progression V1 through V4. Normal intervals.  LABS: BMP Latest Ref Rng & Units 11/17/2016 11/01/2016 10/13/2016  Glucose 65 - 99 mg/dL 177(H) 132(H) 136(H)  BUN 7 - 25 mg/dL 26(H) 24 17  Creatinine 0.70 - 1.33 mg/dL 2.06(H) 1.50(H) 1.38(H)  Sodium 135 - 146 mmol/L 139 141 141  Potassium 3.5 - 5.3 mmol/L 3.9 3.8 3.6  Chloride 98 - 110 mmol/L 105 108 110  CO2 20 - 31 mmol/L '22 26 25  ' Calcium 8.6 - 10.3 mg/dL 8.7 8.8 8.7(L)   Hepatic Function Latest Ref Rng & Units 11/17/2016 08/04/2016 02/16/2016  Total Protein 6.1 - 8.1 g/dL 6.6 7.3 6.9  Albumin 3.6 - 5.1 g/dL 3.4(L) 2.3(L) 3.5(L)  AST 10 - 35 U/L '15 17 20  ' ALT 9 - 46 U/L 16 16(L) 23  Alk Phosphatase 40 - 115 U/L 140(H) 114 131(H)  Total Bilirubin 0.2 - 1.2 mg/dL 0.3 0.3 0.4   CBC Latest Ref Rng & Units 11/17/2016 10/13/2016 10/11/2016   WBC 3.8 - 10.8 K/uL 7.3 5.7 6.0  Hemoglobin 13.2 - 17.1 g/dL 12.3(L) 11.2(L) 10.6(L)  Hematocrit 38.5 - 50.0 % 39.3 36.0(L) 33.5(L)  Platelets 140 - 400 K/uL 207 205 218   Lab Results  Component Value Date   MCV 84.5 11/17/2016   MCV 85.3 10/13/2016   MCV 84.0 10/11/2016   Lab Results  Component Value Date   TSH 2.15 11/17/2016   Lab Results  Component Value Date   HGBA1C 8.5 (H) 11/17/2016   Lipid Panel     Component Value Date/Time   CHOL 158 11/17/2016 0921   TRIG 116 11/17/2016 0921   HDL 74 11/17/2016 0921   CHOLHDL 2.1 11/17/2016 0921   VLDL 23 11/17/2016 0921   LDLCALC 61 11/17/2016 0921      RADIOLOGY: Dg Chest 2 View  10/28/2013   CLINICAL DATA:  Shortness of breath  EXAM: CHEST  2 VIEW  COMPARISON:  10/31/2011  FINDINGS: Mild cardiomegaly, stable from prior. [place] A and B lines noted. No significant effusion. No asymmetric airspace opacity. No acute osseous findings.  IMPRESSION: Mild interstitial pulmonary edema.   Electronically Signed   By: Jorje Guild M.D.   On: 10/28/2013 23:01  Dg Hand Complete Right  10/28/2013   CLINICAL DATA:  Little finger laceration with pain  EXAM: RIGHT HAND - COMPLETE 3+ VIEW  COMPARISON:  None.  FINDINGS: There is extensive soft tissue swelling of the 5th digit, extending into the dorsal hand. No radiodense foreign body. No osseous erosion or asymmetric joint narrowing to suggest osseous/joint infection. No acute fracture or malalignment.  Remote ulnar and radial styloid process fractures.  Degenerative interphalangeal joint narrowing throughout the digits.  IMPRESSION: No evidence of osseous infection.  No radiodense foreign body.   Electronically Signed   By: Jorje Guild M.D.   On: 10/28/2013 23:04    IMPRESSION:  1. Hypertension, unspecified type   2. Obstructive sleep apnea syndrome   3. CAD S/P multiple PCIs   4. Morbid obesity ([place])   5. Hypertensive heart disease with heart failure ([place])   6. OSA  (obstructive sleep apnea)   7. DM type 2 with diabetic peripheral neuropathy ([place])   8. CKD (chronic kidney disease), stage III   9. Hyperlipidemia with target LDL less than 70     ASSESSMENT AND PLAN: Mr. Steffan Caniglia is a 61 year old African-American male with morbid obesity who suffered an acute coronary syndrome/ anterolateral ST segment elevation myocardial infarction in November 2012. At that time he underwent stenting to his subtotaled LAD stenosis and 3 days later underwent staged intervention to a high grade mid right coronary artery stenosis. A nuclear perfusion study in January 2013 was low risk and did show small area of anteroseptal scar towards the apex without associated ischemia.  Due to osteomyelitis he underwent right metatarsal amputation in August 2017.  Subsequently, he developed chest pain and ruled in with a NSTEMI.  Repeat catheterization demonstrated progressive proximal LAD disease and circumflex marginal disease and underwent two-vessel PCI.  Subsequently, he developed atypical chest pain which was described as more of a piercing sensation.  A nuclear study did not reveal new ischemia in October 2017.  He has had issues with continued hypertension leading to titration of his medical regimen.  When I last saw him, I further titrated Toprol to 125 mg daily.  He tells me he is supposed to be taking hydralazine 50 mg every 8 hours, but has been taking all 3 pills at once in the morning.  He continues to take amlodipine 10 mg and isosorbide 120 mg in the morning.  He has renal insufficiency and has been followed by nephrology.  He had a appointment with the nephrologist this morning but canceled since he could not get there due to car trouble.  He is diabetic on insulin.  He continues to be on dual antiplatelet therapy with aspirin and Brilinta.  His blood pressure today is elevated and I recommended further titration of Toprol-XL to 150 mg daily and have suggested we change hydralazine  to 100 mg twice a day.  With reference to his sleep apnea, he feels significant improved and more energy with less fatigue since initiating CPAP therapy.  I reviewed his download in detail with him.  He is 100% compliant and is meeting Medicare guidelines.  He has a leak with his full face mask.  I have suggested that on a daily basis after use he cleanse his mask cushion with baby shampoo.  We also discussed mask pressure.  His ECG remained stable.  He will continue to monitor his blood pressure and notify me if his blood pressure does not improve with his medication titration.  He  will also follow up with nephrology.  He continues to take rosuvastatin 40 mg for hyperlipidemia.  He is morbidly obese and has gained 15 pounds since his last office visit.  Weight loss, improved diet, and exercise was strongly recommended.  I will see him in 4 months for reevaluation.  Time spent: 25 minutes  [name], MD, [facility]  04/06/2017 11:03 AM

## 2017-04-06 NOTE — Patient Instructions (Signed)
Your physician has recommended you make the following change in your medication:   1.) the hydralazine dose has been changed to 100 mg twice a day.   2.) the metoprolol succ has also been changed to 150 mg daily. ( 1 & 1/2 tablet).  New prescriptions has been sent to your pharmacy to reflect these medication changes. Take as directed on the bottles.   Your physician recommends that you schedule a follow-up appointment in: 4 months with Dr Claiborne Billings.

## 2017-04-21 ENCOUNTER — Telehealth: Payer: Self-pay | Admitting: *Deleted

## 2017-04-21 NOTE — Telephone Encounter (Signed)
Notified patient that his Genevie [employer]hire form has been completed and ready for pick up. It will be left at the front desk. Copy made to scan into chart for our records.

## 2017-05-19 ENCOUNTER — Inpatient Hospital Stay (HOSPITAL_COMMUNITY): Payer: BLUE CROSS/BLUE SHIELD

## 2017-05-19 ENCOUNTER — Inpatient Hospital Stay (HOSPITAL_COMMUNITY)
Admission: EM | Admit: 2017-05-19 | Discharge: 2017-05-23 | DRG: 683 | Disposition: A | Discharge status: Home / Self Care | Payer: BLUE CROSS/BLUE SHIELD | Attending: Internal Medicine | Admitting: Internal Medicine

## 2017-05-19 ENCOUNTER — Encounter (HOSPITAL_COMMUNITY): Payer: Self-pay | Admitting: Emergency Medicine

## 2017-05-19 DIAGNOSIS — I130 Hypertensive heart and chronic kidney disease with heart failure and stage 1 through stage 4 chronic kidney disease, or unspecified chronic kidney disease: Secondary | ICD-10-CM | POA: Diagnosis present

## 2017-05-19 DIAGNOSIS — E1143 Type 2 diabetes mellitus with diabetic autonomic (poly)neuropathy: Secondary | ICD-10-CM | POA: Diagnosis present

## 2017-05-19 DIAGNOSIS — Z794 Long term (current) use of insulin: Secondary | ICD-10-CM | POA: Diagnosis not present

## 2017-05-19 DIAGNOSIS — Z9861 Coronary angioplasty status: Secondary | ICD-10-CM

## 2017-05-19 DIAGNOSIS — Z955 Presence of coronary angioplasty implant and graft: Secondary | ICD-10-CM

## 2017-05-19 DIAGNOSIS — N183 Chronic kidney disease, stage 3 unspecified: Secondary | ICD-10-CM | POA: Diagnosis present

## 2017-05-19 DIAGNOSIS — E1122 Type 2 diabetes mellitus with diabetic chronic kidney disease: Secondary | ICD-10-CM | POA: Diagnosis present

## 2017-05-19 DIAGNOSIS — K219 Gastro-esophageal reflux disease without esophagitis: Secondary | ICD-10-CM | POA: Diagnosis present

## 2017-05-19 DIAGNOSIS — I5042 Chronic combined systolic (congestive) and diastolic (congestive) heart failure: Secondary | ICD-10-CM | POA: Diagnosis present

## 2017-05-19 DIAGNOSIS — E861 Hypovolemia: Secondary | ICD-10-CM | POA: Diagnosis present

## 2017-05-19 DIAGNOSIS — I2510 Atherosclerotic heart disease of native coronary artery without angina pectoris: Secondary | ICD-10-CM | POA: Diagnosis present

## 2017-05-19 DIAGNOSIS — Z7982 Long term (current) use of aspirin: Secondary | ICD-10-CM

## 2017-05-19 DIAGNOSIS — E1159 Type 2 diabetes mellitus with other circulatory complications: Secondary | ICD-10-CM

## 2017-05-19 DIAGNOSIS — G4733 Obstructive sleep apnea (adult) (pediatric): Secondary | ICD-10-CM | POA: Diagnosis not present

## 2017-05-19 DIAGNOSIS — E876 Hypokalemia: Secondary | ICD-10-CM | POA: Diagnosis present

## 2017-05-19 DIAGNOSIS — I10 Essential (primary) hypertension: Secondary | ICD-10-CM

## 2017-05-19 DIAGNOSIS — E871 Hypo-osmolality and hyponatremia: Secondary | ICD-10-CM | POA: Diagnosis present

## 2017-05-19 DIAGNOSIS — K3184 Gastroparesis: Secondary | ICD-10-CM | POA: Diagnosis present

## 2017-05-19 DIAGNOSIS — N179 Acute kidney failure, unspecified: Principal | ICD-10-CM | POA: Diagnosis present

## 2017-05-19 DIAGNOSIS — Z6841 Body Mass Index (BMI) 40.0 and over, adult: Secondary | ICD-10-CM

## 2017-05-19 DIAGNOSIS — E1142 Type 2 diabetes mellitus with diabetic polyneuropathy: Secondary | ICD-10-CM | POA: Diagnosis present

## 2017-05-19 DIAGNOSIS — I152 Hypertension secondary to endocrine disorders: Secondary | ICD-10-CM

## 2017-05-19 DIAGNOSIS — E1121 Type 2 diabetes mellitus with diabetic nephropathy: Secondary | ICD-10-CM | POA: Diagnosis present

## 2017-05-19 DIAGNOSIS — Z79899 Other long term (current) drug therapy: Secondary | ICD-10-CM

## 2017-05-19 DIAGNOSIS — G4730 Sleep apnea, unspecified: Secondary | ICD-10-CM | POA: Diagnosis present

## 2017-05-19 DIAGNOSIS — N1832 Chronic kidney disease, stage 3b: Secondary | ICD-10-CM | POA: Diagnosis present

## 2017-05-19 DIAGNOSIS — E119 Type 2 diabetes mellitus without complications: Secondary | ICD-10-CM | POA: Diagnosis present

## 2017-05-19 DIAGNOSIS — I252 Old myocardial infarction: Secondary | ICD-10-CM | POA: Diagnosis not present

## 2017-05-19 DIAGNOSIS — F329 Major depressive disorder, single episode, unspecified: Secondary | ICD-10-CM | POA: Diagnosis present

## 2017-05-19 LAB — CBC WITH DIFFERENTIAL/PLATELET
Basophils Absolute: 0 10*3/uL (ref 0.0–0.1)
Basophils Relative: 0 %
Eosinophils Absolute: 0.1 10*3/uL (ref 0.0–0.7)
Eosinophils Relative: 1 %
HCT: 38.8 % — ABNORMAL LOW (ref 39.0–52.0)
Hemoglobin: 13.6 g/dL (ref 13.0–17.0)
Lymphocytes Relative: 16 %
Lymphs Abs: 1.3 10*3/uL (ref 0.7–4.0)
MCH: 28.3 pg (ref 26.0–34.0)
MCHC: 35.1 g/dL (ref 30.0–36.0)
MCV: 80.7 fL (ref 78.0–100.0)
Monocytes Absolute: 1 10*3/uL (ref 0.1–1.0)
Monocytes Relative: 11 %
Neutro Abs: 6.2 10*3/uL (ref 1.7–7.7)
Neutrophils Relative %: 72 %
Platelets: 183 10*3/uL (ref 150–400)
RBC: 4.81 MIL/uL (ref 4.22–5.81)
RDW: 14.3 % (ref 11.5–15.5)
WBC: 8.6 10*3/uL (ref 4.0–10.5)

## 2017-05-19 LAB — COMPREHENSIVE METABOLIC PANEL
ALT: 23 U/L (ref 17–63)
AST: 19 U/L (ref 15–41)
Albumin: 3.5 g/dL (ref 3.5–5.0)
Alkaline Phosphatase: 115 U/L (ref 38–126)
Anion gap: 13 (ref 5–15)
BUN: 70 mg/dL — ABNORMAL HIGH (ref 6–20)
CO2: 23 mmol/L (ref 22–32)
Calcium: 8.7 mg/dL — ABNORMAL LOW (ref 8.9–10.3)
Chloride: 95 mmol/L — ABNORMAL LOW (ref 101–111)
Creatinine, Ser: 5.61 mg/dL — ABNORMAL HIGH (ref 0.61–1.24)
GFR calc Af Amer: 12 mL/min — ABNORMAL LOW (ref >60)
GFR calc non Af Amer: 10 mL/min — ABNORMAL LOW (ref >60)
Glucose, Bld: 474 mg/dL — ABNORMAL HIGH (ref 65–99)
Potassium: 3.2 mmol/L — ABNORMAL LOW (ref 3.5–5.1)
Sodium: 131 mmol/L — ABNORMAL LOW (ref 135–145)
Total Bilirubin: 0.6 mg/dL (ref 0.3–1.2)
Total Protein: 7.4 g/dL (ref 6.5–8.1)

## 2017-05-19 LAB — URINALYSIS, ROUTINE W REFLEX MICROSCOPIC
Bilirubin Urine: NEGATIVE
Glucose, UA: >=500 mg/dL — AB
Ketones, ur: NEGATIVE mg/dL
Leukocytes, UA: NEGATIVE
Nitrite: NEGATIVE
Protein, ur: 30 mg/dL — AB
Specific Gravity, Urine: 1.009 (ref 1.005–1.030)
pH: 5 (ref 5.0–8.0)

## 2017-05-19 LAB — CK: Total CK: 601 U/L — ABNORMAL HIGH (ref 49–397)

## 2017-05-19 LAB — TROPONIN I: Troponin I: <0.03 ng/mL (ref <0.03)

## 2017-05-19 LAB — GLUCOSE, CAPILLARY: Glucose-Capillary: 470 mg/dL — ABNORMAL HIGH (ref 65–99)

## 2017-05-19 MED ORDER — SODIUM CHLORIDE 0.9 % IV SOLN
INTRAVENOUS | Status: AC
  Administered 2017-05-19: 20:00:00 via INTRAVENOUS

## 2017-05-19 MED ORDER — FAMOTIDINE 10 MG PO TABS
10.0000 mg | ORAL_TABLET | Freq: Two times a day (BID) | ORAL | Status: DC | PRN
  Administered 2017-05-20: 10 mg via ORAL
  Filled 2017-05-19: qty 1

## 2017-05-19 MED ORDER — ONDANSETRON HCL 4 MG/2ML IJ SOLN: 4.0000 mg | Freq: Four times a day (QID) | INTRAMUSCULAR | Status: DC | PRN

## 2017-05-19 MED ORDER — POLYETHYLENE GLYCOL 3350 17 G PO PACK: 17.0000 g | PACK | Freq: Every day | ORAL | Status: DC | PRN

## 2017-05-19 MED ORDER — ACETAMINOPHEN 650 MG RE SUPP: 650.0000 mg | Freq: Four times a day (QID) | RECTAL | Status: DC | PRN

## 2017-05-19 MED ORDER — INSULIN ASPART 100 UNIT/ML ~~LOC~~ SOLN
0.0000 [IU] | Freq: Three times a day (TID) | SUBCUTANEOUS | Status: DC
  Administered 2017-05-20 (×2): 11 [IU] via SUBCUTANEOUS
  Administered 2017-05-20 – 2017-05-21 (×2): 15 [IU] via SUBCUTANEOUS
  Administered 2017-05-21: 8 [IU] via SUBCUTANEOUS
  Administered 2017-05-21 – 2017-05-22 (×2): 11 [IU] via SUBCUTANEOUS
  Administered 2017-05-22 (×2): 5 [IU] via SUBCUTANEOUS
  Administered 2017-05-23: 8 [IU] via SUBCUTANEOUS
  Administered 2017-05-23: 5 [IU] via SUBCUTANEOUS

## 2017-05-19 MED ORDER — INSULIN ASPART 100 UNIT/ML ~~LOC~~ SOLN
4.0000 [IU] | Freq: Three times a day (TID) | SUBCUTANEOUS | Status: DC
  Administered 2017-05-20 – 2017-05-21 (×6): 4 [IU] via SUBCUTANEOUS

## 2017-05-19 MED ORDER — VITAMIN D3 25 MCG (1000 UNIT) PO TABS
5000.0000 [IU] | ORAL_TABLET | Freq: Every day | ORAL | Status: DC
  Administered 2017-05-20 – 2017-05-23 (×4): 5000 [IU] via ORAL
  Filled 2017-05-19 (×4): qty 5

## 2017-05-19 MED ORDER — INSULIN DETEMIR 100 UNIT/ML ~~LOC~~ SOLN
10.0000 [IU] | Freq: Two times a day (BID) | SUBCUTANEOUS | Status: DC
  Administered 2017-05-20 – 2017-05-22 (×6): 10 [IU] via SUBCUTANEOUS
  Filled 2017-05-19 (×7): qty 0.1

## 2017-05-19 MED ORDER — INSULIN ASPART 100 UNIT/ML ~~LOC~~ SOLN
8.0000 [IU] | Freq: Once | SUBCUTANEOUS | Status: AC
  Administered 2017-05-20: 8 [IU] via SUBCUTANEOUS

## 2017-05-19 MED ORDER — TICAGRELOR 90 MG PO TABS
90.0000 mg | ORAL_TABLET | Freq: Two times a day (BID) | ORAL | Status: DC
Start: 2017-05-19 — End: 2017-05-23
  Administered 2017-05-20 – 2017-05-23 (×8): 90 mg via ORAL
  Filled 2017-05-19 (×9): qty 1

## 2017-05-19 MED ORDER — ROSUVASTATIN CALCIUM 20 MG PO TABS
40.0000 mg | ORAL_TABLET | Freq: Every day | ORAL | Status: DC
  Administered 2017-05-20 – 2017-05-22 (×3): 40 mg via ORAL
  Filled 2017-05-19 (×3): qty 2
  Filled 2017-05-19: qty 1

## 2017-05-19 MED ORDER — ISOSORBIDE MONONITRATE ER 60 MG PO TB24
120.0000 mg | ORAL_TABLET | Freq: Every day | ORAL | Status: DC
  Administered 2017-05-20 – 2017-05-23 (×4): 120 mg via ORAL
  Filled 2017-05-19 (×4): qty 2

## 2017-05-19 MED ORDER — PANTOPRAZOLE SODIUM 40 MG PO TBEC
40.0000 mg | DELAYED_RELEASE_TABLET | Freq: Every day | ORAL | Status: DC
  Administered 2017-05-20 – 2017-05-23 (×4): 40 mg via ORAL
  Filled 2017-05-19 (×4): qty 1

## 2017-05-19 MED ORDER — ACETAMINOPHEN 325 MG PO TABS: 650.0000 mg | ORAL_TABLET | Freq: Four times a day (QID) | ORAL | Status: DC | PRN

## 2017-05-19 MED ORDER — ALLOPURINOL 300 MG PO TABS
300.0000 mg | ORAL_TABLET | Freq: Every day | ORAL | Status: DC
  Administered 2017-05-20 – 2017-05-23 (×4): 300 mg via ORAL
  Filled 2017-05-19 (×4): qty 1

## 2017-05-19 MED ORDER — SODIUM CHLORIDE 0.9 % IV BOLUS (SEPSIS)
1000.0000 mL | Freq: Once | INTRAVENOUS | Status: AC
  Administered 2017-05-19: 1000 mL via INTRAVENOUS

## 2017-05-19 MED ORDER — BISACODYL 5 MG PO TBEC: 5.0000 mg | DELAYED_RELEASE_TABLET | Freq: Every day | ORAL | Status: DC | PRN

## 2017-05-19 MED ORDER — HEPARIN SODIUM (PORCINE) 5000 UNIT/ML IJ SOLN
5000.0000 [IU] | Freq: Three times a day (TID) | INTRAMUSCULAR | Status: DC
  Administered 2017-05-20 – 2017-05-23 (×11): 5000 [IU] via SUBCUTANEOUS
  Filled 2017-05-19 (×12): qty 1

## 2017-05-19 MED ORDER — METOPROLOL SUCCINATE ER 50 MG PO TB24
150.0000 mg | ORAL_TABLET | Freq: Every day | ORAL | Status: DC
  Administered 2017-05-20 – 2017-05-23 (×4): 150 mg via ORAL
  Filled 2017-05-19 (×4): qty 1

## 2017-05-19 MED ORDER — HYDRALAZINE HCL 50 MG PO TABS
100.0000 mg | ORAL_TABLET | Freq: Two times a day (BID) | ORAL | Status: DC
  Administered 2017-05-19 – 2017-05-23 (×8): 100 mg via ORAL
  Filled 2017-05-19 (×8): qty 2

## 2017-05-19 MED ORDER — INSULIN ASPART 100 UNIT/ML ~~LOC~~ SOLN
0.0000 [IU] | Freq: Every day | SUBCUTANEOUS | Status: DC
  Administered 2017-05-20: 5 [IU] via SUBCUTANEOUS
  Administered 2017-05-21: 2 [IU] via SUBCUTANEOUS

## 2017-05-19 MED ORDER — ASPIRIN EC 81 MG PO TBEC
81.0000 mg | DELAYED_RELEASE_TABLET | Freq: Every day | ORAL | Status: DC
Start: 2017-05-20 — End: 2017-05-23
  Administered 2017-05-20 – 2017-05-23 (×4): 81 mg via ORAL
  Filled 2017-05-19 (×4): qty 1

## 2017-05-19 MED ORDER — AMLODIPINE BESYLATE 5 MG PO TABS
10.0000 mg | ORAL_TABLET | Freq: Every day | ORAL | Status: DC
Start: 2017-05-19 — End: 2017-05-23
  Administered 2017-05-20 – 2017-05-23 (×5): 10 mg via ORAL
  Filled 2017-05-19 (×5): qty 2

## 2017-05-19 MED ORDER — HYDROCODONE-ACETAMINOPHEN 5-325 MG PO TABS
1.0000 | ORAL_TABLET | ORAL | Status: DC | PRN
  Administered 2017-05-20: 1 via ORAL
  Filled 2017-05-19: qty 1

## 2017-05-19 MED ORDER — POTASSIUM CHLORIDE CRYS ER 20 MEQ PO TBCR
20.0000 meq | EXTENDED_RELEASE_TABLET | Freq: Once | ORAL | Status: AC
  Administered 2017-05-19: 20 meq via ORAL
  Filled 2017-05-19: qty 1

## 2017-05-19 MED ORDER — ONDANSETRON HCL 4 MG PO TABS: 4.0000 mg | ORAL_TABLET | Freq: Four times a day (QID) | ORAL | Status: DC | PRN

## 2017-05-19 NOTE — ED Triage Notes (Signed)
Patient here from home. Reports that primary care dr called him and told him to come in due to elevated BUN and Creat levels. States that he needs fluids. Hx stage 3 kidney disease.

## 2017-05-19 NOTE — H&P (Signed)
History and Physical    Joshua [name] TDS:[id number] DOB: Dec 01, 1956 DOA: 05/19/2017  PCP: Joshua Dus, MD   Patient coming from: Home  Chief Complaint: Fatigue, malaise, anorexia   HPI: Joshua [name] is a 61 y.o. male with medical history significant for hypertension, insulin-dependent diabetes mellitus, coronary artery disease with stent, chronic kidney disease stage III, and GERD, now presenting to the ED at the direction of his nephrologist for evaluation of acute on chronic kidney disease. Patient reports that he has been dealing with difficult to control hypertension and chronic kidney disease stage III, and was recently referred to nephrology for this reason. He saw the nephrologist 2 weeks ago, had his blood pressure medications adjusted, and was sent for blood work which she had just completed. He was called yesterday by the nephrologist with directions to report to the ED for marked worsening in his renal function, but did not get the message until today. Patient reports that for the past 2 weeks, he has had loss of appetite, nonspecific malaise, and has been working at a new job which is physically demanding and involves a lot of sweating. Patient denies any chest pain or palpitations, denies abdominal pain or vomiting, and denies flank pain or dysuria. He does note that his urine output has decreased and his urine has become darker over the last week or 2. No recent fevers or chills. Denies any NSAID use.  ED Course: Upon arrival to the ED, patient is found to be afebrile, saturating well on room air, and with vital signs stable. EKG features a sinus rhythm with first-degree AV nodal block. Chemistry panels notable for a sodium of 131, potassium 3.2, glucose 474, BUN of 70, and serum creatinine of 5.61, up from priors in the 2.0 range. CBC is unremarkable. Urinalysis features of microscopic hematuria and proteinuria. She was given a liter of normal saline in the ED. He remained  hemodynamically stable and in no acute respiratory distress and will be admitted to the medical/surgical unit for ongoing evaluation and management of acute on chronic renal failure, suspected to be prerenal in the setting of poor appetite, new physically demanding job, and a 30 pound weight loss in the past month.  Review of Systems:  All other systems reviewed and apart from HPI, are negative.  Past Medical History:  Diagnosis Date  . Chronic combined systolic and diastolic CHF (congestive heart failure) ([place])    a. 07/2015 Echo: EF 45-50%, mod LVH, mid apical/antsept AK, Gr 1 DD.  [name] CKD (chronic kidney disease), stage III   . Coronary artery disease    a. 2012 s/p MI/PCI: s/p DES to mid/dist RCA and overlapping DES to LAD;  b. 07/2015 Cath: LAD 10% ISR, D1 40(jailed), LCX 40m, OM2 30, OM3 30, RCA 20m ISR, 10d ISR; c. 07/2016 NSTEMI/PCI: LAD 85ost/p/m (3.5x20 Synergy DES overlapping prior stent), D1 40, LCX 5m, OM2 95 (2.75x16 Synergy DES), OM3 30, RCA 41m, 10d.  . Depression   . Diabetic gastroparesis (Joshua [name])   . GERD (gastroesophageal reflux disease)   . Gout   . Gout   . Heart murmur   . Hyperlipemia   . Hypertensive heart disease   . Ischemic cardiomyopathy    a. 07/2015 Echo: EF 45-50%, mod LVH, mid-apicalanteroseptal AK, Gr1 DD.  . Morbid obesity (Joshua [name])   . Myocardial infarction (Joshua [name]) 2012  . OSA on CPAP   . Osteomyelitis ([place])    a. 07/2016 MTP joint of R great toe s/p transmetatarsal amputation.  [name]  Polyneuropathy in diabetes(357.2)   . Pulmonary sarcoidosis (Joshua [name])    "problems w/it years ago; no problems anymore" (07/03/2014)  . Type II diabetes mellitus (Joshua [name])     Past Surgical History:  Procedure Laterality Date  . AMPUTATION Right 07/24/2016   Procedure: TRANSMETATARSAL FOOT AMPUTATION;  Surgeon: [name], MD;  Location: [facility];  Service: Orthopedics;  Laterality: Right;  . BREAST LUMPECTOMY Right ~ 2000   "benign"  . CARDIAC CATHETERIZATION N/A 07/30/2015   Procedure:  Left Heart Cath and Coronary Angiography;  Surgeon: Burnell Blanks, MD;  Location: [facility];  Service: Cardiovascular;  Laterality: N/A;  . CARDIAC CATHETERIZATION N/A 07/19/2016   Procedure: Left Heart Cath and Coronary Angiography;  Surgeon: Belva Crome, MD;  Location: [facility];  Service: Cardiovascular;  Laterality: N/A;  . CARDIAC CATHETERIZATION N/A 07/29/2016   Procedure: Coronary Stent Intervention;  Surgeon: Belva Crome, MD;  Location: [facility];  Service: Cardiovascular;  Laterality: N/A;  . CARDIAC CATHETERIZATION N/A 07/29/2016   Procedure: Intravascular Ultrasound/IVUS;  Surgeon: Belva Crome, MD;  Location: [facility];  Service: Cardiovascular;  Laterality: N/A;  . CATARACT EXTRACTION W/ INTRAOCULAR LENS  IMPLANT, BILATERAL Bilateral 2014-2015  . CORONARY ANGIOPLASTY WITH STENT PLACEMENT  10/31/2011   30 % MID ATRIOVENTRICULAR GROOVE CX STENOSIS. 90 % MID RCA.PTA TO LAD/STENTING OF TANDEM 60-70%. LAD STENOSIS 99% REDUCED TO 0%.3.0 X 32MM PROMUS DES POSTDILATED TO 3.25MM.  [name] CORONARY ANGIOPLASTY WITH STENT PLACEMENT  07/03/2014   "2"  . I&D EXTREMITY Right 10/30/2013   Procedure: IRRIGATION AND DEBRIDEMENT RIGHT SMALL FINGER POSSIBLE SECONDARY WOUND CLOSURE;  Surgeon: [name], MD;  Location: [facility];  Service: Orthopedics;  Laterality: Right;  [name] is available after 4pm  . I&D EXTREMITY Right 11/01/2013   Procedure:  IRRIGATION AND DEBRIDEMENT RIGHT  PROXIMAL PHALANGEAL LEVEL AMPUTATION;  Surgeon: [name], MD;  Location: [facility];  Service: Orthopedics;  Laterality: Right;  . INCISION AND DRAINAGE Right 10/28/2013   Procedure: INCISION AND DRAINAGE Right small finger;  Surgeon: [name], MD;  Location: [facility];  Service: Orthopedics;  Laterality: Right;  . LEFT HEART CATH Left 11/03/2011   Procedure: LEFT HEART CATH;  Surgeon: [name], MD;  Location: [facility];  Service: Cardiovascular;  Laterality:  Left;  . LEFT HEART CATHETERIZATION WITH CORONARY ANGIOGRAM N/A 07/03/2014   Procedure: LEFT HEART CATHETERIZATION WITH CORONARY ANGIOGRAM;  Surgeon: [name], MD;  Location: [facility];  Service: Cardiovascular;  Laterality: N/A;  . PERCUTANEOUS CORONARY STENT INTERVENTION (PCI-S) Left 11/03/2011   Procedure: PERCUTANEOUS CORONARY STENT INTERVENTION (PCI-S);  Surgeon: [name], MD;  Location: [facility] C[facility];  Service: Cardiovascular;  Laterality: Left;  . TOE AMPUTATION Right ~ 2010   "2nd toe"  . TOE AMPUTATION Right 2012   "3rd toe"     reports that he has never smoked. He has never used smokeless tobacco. He reports that he does not drink alcohol or use drugs.  No Known Allergies  Family History  Problem Relation Age of Onset  . Heart attack Maternal Aunt   . Diabetes Maternal Grandmother   . Hypertension Maternal Grandmother      Prior to Admission medications   Medication Sig Start Date End Date Taking? Authorizing Provider  acetaminophen (TYLENOL) 325 MG tablet Take 2 tablets (650 mg total) by mouth every 4 (four) hours as needed for headache or mild pain. 08/03/16  Yes Isaiah Serge, NP  allopurinol (ZYLOPRIM) 300 MG tablet Take 1 tablet (300 mg total) by mouth daily. 08/13/16  Yes Love, Ivan Anchors, PA-C  amLODipine (NORVASC) 10 MG tablet TAKE 1 TABLET BY MOUTH EVERY DAY 03/17/17  Yes Rogelia Mire, NP  aspirin EC 81 MG tablet Take 81 mg by mouth daily.   Yes [provider]  BRILINTA 90 MG TABS tablet TAKE 1 TABLET BY MOUTH TWICE A DAY 03/07/17  Yes [name], MD  chlorthalidone (HYGROTON) 25 MG tablet Take 25 mg by mouth daily. 05/03/17  Yes [provider]  Cholecalciferol ([employer] VIT D 5000 HIGH-POTENCY) 5000 units capsule Take 5,000 Units by mouth daily.   Yes [provider]  hydrALAZINE (APRESOLINE) 100 MG tablet Take 1 tablet (100 mg total) by mouth 2 (two) times daily. 04/06/17  Yes [name], MD  insulin NPH Human  (HUMULIN N,NOVOLIN N) 100 UNIT/ML injection Use 20 units in morning and 22 units at bedtime. Patient taking differently: Inject 20-25 Units into the skin 2 (two) times daily. Use 25 units in morning and 20 units at bedtime. 08/13/16  Yes Love, Ivan Anchors, PA-C  isosorbide mononitrate (IMDUR) 120 MG 24 hr tablet Take 1 tablet (120 mg total) by mouth daily. 02/16/17  Yes [name], MD  metoprolol succinate (TOPROL XL) 100 MG 24 hr tablet Take 1 & 1/2  Tablets daily 04/06/17  Yes [name], MD  nitroGLYCERIN (NITROSTAT) 0.4 MG SL tablet Place 1 tablet (0.4 mg total) under the tongue every 5 (five) minutes as needed for chest pain. 10/19/13  Yes [name], MD  pantoprazole (PROTONIX) 40 MG tablet Take 1 tablet (40 mg total) by mouth daily. 08/13/16  Yes Love, Ivan Anchors, PA-C  rosuvastatin (CRESTOR) 40 MG tablet Take 1 tablet (40 mg total) by mouth daily. 03/23/17  Yes [name], MD    Physical Exam: Vitals:   05/19/17 1408 05/19/17 1708  BP: 126/70 139/75  Pulse: 76 66  Resp: 16 15  Temp: 98 F (36.7 C) 97.6 F (36.4 C)  TempSrc: Oral Oral  SpO2: 95% 95%  Weight: 136.1 kg (300 lb)   Height: $Remove'5\' 9"'tpVuJGY$  (1.753 m)       Constitutional: NAD, calm, no diaphoresis, no pallor Eyes: PERTLA, lids and conjunctivae normal ENMT: Mucous membranes are moist. Posterior pharynx clear of any exudate or lesions.   Neck: normal, supple, no masses, no thyromegaly Respiratory: clear to auscultation bilaterally, no wheezing, no crackles. Normal respiratory effort.    Cardiovascular: S1 & S2 heard, regular rate and rhythm. No extremity edema. No significant JVD. Abdomen: No distension, no tenderness, no masses palpated. Bowel sounds normal.  Musculoskeletal: no clubbing / cyanosis. Status-post right TMA.   Skin: no significant rashes, lesions, ulcers. Warm, dry, well-perfused. Neurologic: CN 2-12 grossly intact. Sensation intact, DTR normal. Strength 5/5 in all 4 limbs.  Psychiatric: Alert and  oriented x 3. Calm and cooperative.     Labs on Admission: I have personally reviewed following labs and imaging studies  CBC:  Recent Labs Lab 05/19/17 1551  WBC 8.6  NEUTROABS 6.2  HGB 13.6  HCT 38.8*  MCV 80.7  PLT 174   Basic Metabolic Panel:  Recent Labs Lab 05/19/17 1551  NA 131*  K 3.2*  CL 95*  CO2 23  GLUCOSE 474*  BUN 70*  CREATININE 5.61*  CALCIUM 8.7*   GFR: Estimated Creatinine Clearance: 19.7 mL/min (A) (by C-G formula based  on SCr of 5.61 mg/dL (H)). Liver Function Tests:  Recent Labs Lab 05/19/17 1551  AST 19  ALT 23  ALKPHOS 115  BILITOT 0.6  PROT 7.4  ALBUMIN 3.5   No results for input(s): LIPASE, AMYLASE in the last 168 hours. No results for input(s): AMMONIA in the last 168 hours. Coagulation Profile: No results for input(s): INR, PROTIME in the last 168 hours. Cardiac Enzymes:  Recent Labs Lab 05/19/17 1551  CKTOTAL 601*  TROPONINI <0.03   BNP (last 3 results) No results for input(s): PROBNP in the last 8760 hours. HbA1C: No results for input(s): HGBA1C in the last 72 hours. CBG: No results for input(s): GLUCAP in the last 168 hours. Lipid Profile: No results for input(s): CHOL, HDL, LDLCALC, TRIG, CHOLHDL, LDLDIRECT in the last 72 hours. Thyroid Function Tests: No results for input(s): TSH, T4TOTAL, FREET4, T3FREE, THYROIDAB in the last 72 hours. Anemia Panel: No results for input(s): VITAMINB12, FOLATE, FERRITIN, TIBC, IRON, RETICCTPCT in the last 72 hours. Urine analysis:    Component Value Date/Time   COLORURINE YELLOW 05/19/2017 1544   APPEARANCEUR HAZY (A) 05/19/2017 1544   LABSPEC 1.009 05/19/2017 1544   PHURINE 5.0 05/19/2017 1544   GLUCOSEU >=500 (A) 05/19/2017 1544   HGBUR MODERATE (A) 05/19/2017 1544   BILIRUBINUR NEGATIVE 05/19/2017 1544   KETONESUR NEGATIVE 05/19/2017 1544   PROTEINUR 30 (A) 05/19/2017 1544   UROBILINOGEN 0.2 11/01/2011 1410   NITRITE NEGATIVE 05/19/2017 1544   LEUKOCYTESUR NEGATIVE  05/19/2017 1544   Sepsis Labs: $[id number]'[id number]$ (procalcitonin:4,lacticidven:4) )No results found for this or any previous visit (from the past 240 hour(s)).   Radiological Exams on Admission: US Renal  Result Date: 05/19/2017 CLINICAL DATA:  Acute renal failure on chronic kidney disease. EXAM: RENAL / URINARY TRACT ULTRASOUND COMPLETE COMPARISON:  11/29/2016 FINDINGS: Right Kidney: Length: 10.9 cm. Echogenic renal parenchyma. No focal mass or hydronephrosis. Left Kidney: Length: 10.3 cm. Echogenic renal parenchyma. No focal mass or hydronephrosis. Bladder: Appears normal for degree of bladder distention. Bilateral ureteral jets are present. IMPRESSION: 1. No hydronephrosis. 2. Echogenic renal parenchyma consistent with the history of chronic kidney disease. Electronically Signed   By: Nolon Nations M.D.   On: 05/19/2017 19:00    EKG: Independently reviewed. Sinus rhythm, 1st degree AVN block.   Assessment/Plan  1. Acute kidney injury superimposed on CKD stage III - SCr is 5.61 on admission with BUN 70, up from 2.06 and 26 six months earlier (most recent values available)  - Pt reports 2 wks of malaise with poor appetite; he has a new physically-demanding job and sweats a lot at work; weight is down 28 lbs in past 6 wks per chart notes  - No acidosis or volume-issues  - Denies NSAID use or recent illness; suspect this is prerenal given the history  - He was given 1 liter of NS in ED - Plan to check renal US and urine studies, continue IVF hydration, avoid nephrotoxins, renally-dose medications, and repeat chem panel in am    2. Hypertension - BP is at goal  - Continue Toprol, hydralazine, and Norvasc - Chlorthalidone held on admission while hydrating and correcting electrolyte disturbanes-   3. CAD - No anginal complaints  - Hx of stents  - Continue Brilinta, ASA 81, Toprol, and Crestor   4. Insulin-dependent DM  - A1c was 8.5% in December '17 and improved from priors - Managed at home  with NPH insulin 25 units qAM and 20 units qPM  - Check CBG with  meals and qHS  - Start Levemir 10 units BID, Novolog 4 units with meals, and a moderate-intensity correctional  5. Hyponatremia  - Serum sodium 131 on admission in setting of hypovolemia  - 1 liter NS given in ED and NS infusion continued as above  - Repeat chem panel in am    6. OSA  - Tolerating CPAP at home, will continue qHS     DVT prophylaxis: sq heparin Code Status: Full  Family Communication: Discussed with patient Disposition Plan: Admit to med-surg Consults called: None Admission status: Inpatient    Vianne Bulls, MD [facility] Pager [phone]  If 7PM-7AM, please contact night-coverage [email or web address] Password [email or web address]  05/19/2017, 7:09 PM

## 2017-05-19 NOTE — ED Provider Notes (Signed)
[place] DEPT Provider Note   CSN: [id number] Arrival date & time: 05/19/17  1405     History   Chief Complaint Chief Complaint  Patient presents with  . Abnormal Lab    HPI BRAIDAN RICCIARDI is a 61 y.o. male.  HPI   Sent by nephrologist Dr. Madelon Lips for concern for acute on chronic kidney injury 2 weeks ago was started on new medication from kidney doctor, she was checking labs related to initiating the medication, drew it yesterday. Reports it was drawing too much fluid off.  Not taking ibuprofen.    Urinating a touch more since starting the medicine.  No diarrhea. Low appetite since starting medication 2 weeks ago. Started a new job which is physical, sweating a lot. Not lifting but moving more, on feet more. First day at work was having more cramping, legs, stomach.   Hx of prior shoulder pain from shoulder injury seems worse. Heart burn for last 4-5 days, better with protonix, yesterday and this morning worse. Doesn't feel like last heart attack.   Past Medical History:  Diagnosis Date  . Chronic combined systolic and diastolic CHF (congestive heart failure) ([place])    a. 07/2015 Echo: EF 45-50%, mod LVH, mid apical/antsept AK, Gr 1 DD.  [name] CKD (chronic kidney disease), stage III   . Coronary artery disease    a. 2012 s/p MI/PCI: s/p DES to mid/dist RCA and overlapping DES to LAD;  b. 07/2015 Cath: LAD 10% ISR, D1 40(jailed), LCX 45m, OM2 30, OM3 30, RCA 78m ISR, 10d ISR; c. 07/2016 NSTEMI/PCI: LAD 85ost/p/m (3.5x20 Synergy DES overlapping prior stent), D1 40, LCX 75m, OM2 95 (2.75x16 Synergy DES), OM3 30, RCA 81m, 10d.  . Depression   . Diabetic gastroparesis ([place])   . GERD (gastroesophageal reflux disease)   . Gout   . Gout   . Heart murmur   . Hyperlipemia   . Hypertensive heart disease   . Ischemic cardiomyopathy    a. 07/2015 Echo: EF 45-50%, mod LVH, mid-apicalanteroseptal AK, Gr1 DD.  . Morbid obesity ([place])   . Myocardial infarction ([place]) 2012  . OSA on  CPAP   . Osteomyelitis ([place])    a. 07/2016 MTP joint of R great toe s/p transmetatarsal amputation.  . Polyneuropathy in diabetes(357.2)   . Pulmonary sarcoidosis ([place])    "problems w/it years ago; no problems anymore" (07/03/2014)  . Type II diabetes mellitus [facility])     Patient Active Problem List   Diagnosis Date Noted  . Acute renal failure superimposed on stage 3 chronic kidney disease ([place]) 05/19/2017  . Essential hypertension   . Status post transmetatarsal amputation of foot, right ([place]) 11/08/2016  . Diabetic polyneuropathy associated with type 2 diabetes mellitus ([place]) 11/08/2016  . Pure hypercholesterolemia   . AKI (acute kidney injury) ([place])   . Acute blood loss anemia   . DM type 2 with diabetic peripheral neuropathy ([place])   . Physical debility 08/03/2016  . Chronic osteomyelitis of right foot ([place])   . Cellulitis and abscess of leg, except foot   . Penetrating wound of right foot   . CKD (chronic kidney disease) stage 3, GFR 30-59 ml/min   . Unstable angina pectoris ([place]) 07/18/2016  . Chronic combined systolic and diastolic congestive heart failure ([place])   . Chest pain with moderate risk for cardiac etiology 06/24/2014  . CAD S/P multiple PCIs 06/24/2014  . Sleep apnea- on C-pap 06/24/2014  . Morbid obesity due to excess calories ([place])  11/10/2013  . Hyperglycemia 10/29/2013  . Hyperlipidemia 01/12/2011  . Benign essential HTN 01/12/2011  . GERD 01/12/2011    Past Surgical History:  Procedure Laterality Date  . AMPUTATION Right 07/24/2016   Procedure: TRANSMETATARSAL FOOT AMPUTATION;  Surgeon: [name], MD;  Location: [facility];  Service: Orthopedics;  Laterality: Right;  . BREAST LUMPECTOMY Right ~ 2000   "benign"  . CARDIAC CATHETERIZATION N/A 07/30/2015   Procedure: Left Heart Cath and Coronary Angiography;  Surgeon: Burnell Blanks, MD;  Location: [place] CV LAB;  Service: Cardiovascular;  Laterality: N/A;  . CARDIAC CATHETERIZATION N/A 07/19/2016    Procedure: Left Heart Cath and Coronary Angiography;  Surgeon: Belva Crome, MD;  Location: [place] CV LAB;  Service: Cardiovascular;  Laterality: N/A;  . CARDIAC CATHETERIZATION N/A 07/29/2016   Procedure: Coronary Stent Intervention;  Surgeon: Belva Crome, MD;  Location: [place] CV LAB;  Service: Cardiovascular;  Laterality: N/A;  . CARDIAC CATHETERIZATION N/A 07/29/2016   Procedure: Intravascular Ultrasound/IVUS;  Surgeon: Belva Crome, MD;  Location: [place] CV LAB;  Service: Cardiovascular;  Laterality: N/A;  . CATARACT EXTRACTION W/ INTRAOCULAR LENS  IMPLANT, BILATERAL Bilateral 2014-2015  . CORONARY ANGIOPLASTY WITH STENT PLACEMENT  10/31/2011   30 % MID ATRIOVENTRICULAR GROOVE CX STENOSIS. 90 % MID RCA.PTA TO LAD/STENTING OF TANDEM 60-70%. LAD STENOSIS 99% REDUCED TO 0%.3.0 X 32MM PROMUS DES POSTDILATED TO 3.25MM.  [name] CORONARY ANGIOPLASTY WITH STENT PLACEMENT  07/03/2014   "2"  . I&D EXTREMITY Right 10/30/2013   Procedure: IRRIGATION AND DEBRIDEMENT RIGHT SMALL FINGER POSSIBLE SECONDARY WOUND CLOSURE;  Surgeon: [name], MD;  Location: [facility];  Service: Orthopedics;  Laterality: Right;  [name] is available after 4pm  . I&D EXTREMITY Right 11/01/2013   Procedure:  IRRIGATION AND DEBRIDEMENT RIGHT  PROXIMAL PHALANGEAL LEVEL AMPUTATION;  Surgeon: [name], MD;  Location: [facility];  Service: Orthopedics;  Laterality: Right;  . INCISION AND DRAINAGE Right 10/28/2013   Procedure: INCISION AND DRAINAGE Right small finger;  Surgeon: [name], MD;  Location: [facility];  Service: Orthopedics;  Laterality: Right;  . LEFT HEART CATH Left 11/03/2011   Procedure: LEFT HEART CATH;  Surgeon: [name], MD;  Location: [facility] CATH LAB;  Service: Cardiovascular;  Laterality: Left;  . LEFT HEART CATHETERIZATION WITH CORONARY ANGIOGRAM N/A 07/03/2014   Procedure: LEFT HEART CATHETERIZATION WITH CORONARY ANGIOGRAM;  Surgeon: [name], MD;  Location: [facility] CATH LAB;   Service: Cardiovascular;  Laterality: N/A;  . PERCUTANEOUS CORONARY STENT INTERVENTION (PCI-S) Left 11/03/2011   Procedure: PERCUTANEOUS CORONARY STENT INTERVENTION (PCI-S);  Surgeon: [name], MD;  Location: [facility] CATH LAB;  Service: Cardiovascular;  Laterality: Left;  . TOE AMPUTATION Right ~ 2010   "2nd toe"  . TOE AMPUTATION Right 2012   "3rd toe"       Home Medications    Prior to Admission medications   Medication Sig Start Date End Date Taking? Authorizing Provider  acetaminophen (TYLENOL) 325 MG tablet Take 2 tablets (650 mg total) by mouth every 4 (four) hours as needed for headache or mild pain. 08/03/16  Yes Isaiah Serge, NP  allopurinol (ZYLOPRIM) 300 MG tablet Take 1 tablet (300 mg total) by mouth daily. 08/13/16  Yes Love, Ivan Anchors, PA-C  amLODipine (NORVASC) 10 MG tablet TAKE 1 TABLET BY MOUTH EVERY DAY 03/17/17  Yes Rogelia Mire, NP  aspirin EC 81 MG tablet Take 81 mg by mouth daily.  Yes [provider]  BRILINTA 90 MG TABS tablet TAKE 1 TABLET BY MOUTH TWICE A DAY 03/07/17  Yes [name], MD  chlorthalidone (HYGROTON) 25 MG tablet Take 25 mg by mouth daily. 05/03/17  Yes [provider]  Cholecalciferol ([employer] VIT D 5000 HIGH-POTENCY) 5000 units capsule Take 5,000 Units by mouth daily.   Yes [provider]  hydrALAZINE (APRESOLINE) 100 MG tablet Take 1 tablet (100 mg total) by mouth 2 (two) times daily. 04/06/17  Yes [name], MD  insulin NPH Human (HUMULIN N,NOVOLIN N) 100 UNIT/ML injection Use 20 units in morning and 22 units at bedtime. Patient taking differently: Inject 20-25 Units into the skin 2 (two) times daily. Use 25 units in morning and 20 units at bedtime. 08/13/16  Yes Love, Ivan Anchors, PA-C  isosorbide mononitrate (IMDUR) 120 MG 24 hr tablet Take 1 tablet (120 mg total) by mouth daily. 02/16/17  Yes [name], MD  metoprolol succinate (TOPROL XL) 100 MG 24 hr tablet Take 1 & 1/2  Tablets daily 04/06/17  Yes [name], MD  nitroGLYCERIN (NITROSTAT) 0.4 MG SL tablet Place 1 tablet (0.4 mg total) under the tongue every 5 (five) minutes as needed for chest pain. 10/19/13  Yes [name], MD  pantoprazole (PROTONIX) 40 MG tablet Take 1 tablet (40 mg total) by mouth daily. 08/13/16  Yes Love, Ivan Anchors, PA-C  rosuvastatin (CRESTOR) 40 MG tablet Take 1 tablet (40 mg total) by mouth daily. 03/23/17  Yes [name], MD    Family History Family History  Problem Relation Age of Onset  . Heart attack Maternal Aunt   . Diabetes Maternal Grandmother   . Hypertension Maternal Grandmother     Social History Social History  Substance Use Topics  . Smoking status: Never Smoker  . Smokeless tobacco: Never Used  . Alcohol use No     Comment: none since 07/2016     Allergies   Patient has no known allergies.   Review of Systems Review of Systems  Constitutional: Positive for appetite change. Negative for fever.  HENT: Positive for rhinorrhea (after wearing mask at work). Negative for congestion and sore throat (but felt like upper palate was sore).   Eyes: Negative for visual disturbance.  Respiratory: Negative for cough (mild, no sig change) and shortness of breath (has been going on for a while, since seeing cardiologist, unchanged).   Cardiovascular: Negative for leg swelling. Chest pain: heart burn.  Gastrointestinal: Negative for abdominal pain, diarrhea, nausea and vomiting.  Genitourinary: Negative for difficulty urinating and dysuria.  Musculoskeletal: Positive for arthralgias and myalgias. Negative for back pain and neck stiffness.  Skin: Negative for rash.  Neurological: Negative for syncope and headaches.     Physical Exam Updated Vital Signs BP (!) 147/86 (BP Location: Right Arm)   Pulse 68   Temp 98 F (36.7 C) (Oral)   Resp 18   Ht $R'5\' 9"'Lr$  (1.753 m)   Wt 135 kg (297 lb 11.2 oz)   SpO2 100%   BMI 43.96 kg/m   Physical Exam  Constitutional: He is oriented to person,  place, and time. He appears well-developed and well-nourished. No distress.  HENT:  Head: Normocephalic and atraumatic.  Eyes: Conjunctivae and EOM are normal.  Neck: Normal range of motion.  Cardiovascular: Normal rate, regular rhythm, normal heart sounds and intact distal pulses.  Exam reveals no gallop and no friction rub.   No murmur heard. Pulmonary/Chest: Effort  normal and breath sounds normal. No respiratory distress. He has no wheezes. He has no rales.  Abdominal: Soft. He exhibits no distension. There is no tenderness. There is no guarding.  Musculoskeletal: He exhibits edema (trace bilateral).  Neurological: He is alert and oriented to person, place, and time.  Skin: Skin is warm and dry. He is not diaphoretic.  Nursing note and vitals reviewed.    ED Treatments / Results  Labs (all labs ordered are listed, but only abnormal results are displayed) Labs Reviewed  CBC WITH DIFFERENTIAL/PLATELET - Abnormal; Notable for the following:       Result Value   HCT 38.8 (*)    All other components within normal limits  COMPREHENSIVE METABOLIC PANEL - Abnormal; Notable for the following:    Sodium 131 (*)    Potassium 3.2 (*)    Chloride 95 (*)    Glucose, Bld 474 (*)    BUN 70 (*)    Creatinine, Ser 5.61 (*)    Calcium 8.7 (*)    GFR calc non Af Amer 10 (*)    GFR calc Af Amer 12 (*)    All other components within normal limits  URINALYSIS, ROUTINE W REFLEX MICROSCOPIC - Abnormal; Notable for the following:    APPearance HAZY (*)    Glucose, UA >=500 (*)    Hgb urine dipstick MODERATE (*)    Protein, ur 30 (*)    Bacteria, UA RARE (*)    Squamous Epithelial / LPF 0-5 (*)    All other components within normal limits  CK - Abnormal; Notable for the following:    Total CK 601 (*)    All other components within normal limits  GLUCOSE, CAPILLARY - Abnormal; Notable for the following:    Glucose-Capillary 470 (*)    All other components within normal limits  TROPONIN I    SODIUM, URINE, RANDOM  CREATININE, URINE, RANDOM  UREA NITROGEN, URINE  CBC  BASIC METABOLIC PANEL  HEMOGLOBIN A1C    EKG  EKG Interpretation  Date/Time:  Thursday May 19 2017 17:09:12 EDT Ventricular Rate:  65 PR Interval:    QRS Duration: 100 QT Interval:  422 QTC Calculation: 439 R Axis:   33 Text Interpretation:  Sinus rhythm Prolonged PR interval Anteroseptal infarct, old No significant change since last tracing Confirmed by Alvira Monday ([id number]) on 05/19/2017 6:02:15 PM       Radiology US Renal  Result Date: 05/19/2017 CLINICAL DATA:  Acute renal failure on chronic kidney disease. EXAM: RENAL / URINARY TRACT ULTRASOUND COMPLETE COMPARISON:  11/29/2016 FINDINGS: Right Kidney: Length: 10.9 cm. Echogenic renal parenchyma. No focal mass or hydronephrosis. Left Kidney: Length: 10.3 cm. Echogenic renal parenchyma. No focal mass or hydronephrosis. Bladder: Appears normal for degree of bladder distention. Bilateral ureteral jets are present. IMPRESSION: 1. No hydronephrosis. 2. Echogenic renal parenchyma consistent with the history of chronic kidney disease. Electronically Signed   By: Norva Pavlov M.D.   On: 05/19/2017 19:00    Procedures Procedures (including critical care time)  Medications Ordered in ED Medications  0.9 %  sodium chloride infusion ( Intravenous New Bag/Given 05/19/17 1936)  hydrALAZINE (APRESOLINE) tablet 100 mg (100 mg Oral Given 05/19/17 2359)  metoprolol succinate (TOPROL-XL) 24 hr tablet 150 mg (not administered)  rosuvastatin (CRESTOR) tablet 40 mg (not administered)  amLODipine (NORVASC) tablet 10 mg (10 mg Oral Given 05/20/17 0000)  ticagrelor (BRILINTA) tablet 90 mg (90 mg Oral Given 05/20/17 0001)  cholecalciferol (VITAMIN D) tablet 5,000  Units (not administered)  isosorbide mononitrate (IMDUR) 24 hr tablet 120 mg (not administered)  allopurinol (ZYLOPRIM) tablet 300 mg (not administered)  pantoprazole (PROTONIX) EC tablet 40 mg (not administered)   aspirin EC tablet 81 mg (not administered)  heparin injection 5,000 Units (5,000 Units Subcutaneous Given 05/20/17 0002)  acetaminophen (TYLENOL) tablet 650 mg (not administered)    Or  acetaminophen (TYLENOL) suppository 650 mg (not administered)  HYDROcodone-acetaminophen (NORCO/VICODIN) 5-325 MG per tablet 1-2 tablet (1 tablet Oral Given 05/20/17 0005)  polyethylene glycol (MIRALAX / GLYCOLAX) packet 17 g (not administered)  bisacodyl (DULCOLAX) EC tablet 5 mg (not administered)  ondansetron (ZOFRAN) tablet 4 mg (not administered)    Or  ondansetron (ZOFRAN) injection 4 mg (not administered)  insulin detemir (LEVEMIR) injection 10 Units (10 Units Subcutaneous Given 05/20/17 0001)  insulin aspart (novoLOG) injection 0-15 Units (not administered)  insulin aspart (novoLOG) injection 4 Units (not administered)  insulin aspart (novoLOG) injection 0-5 Units (not administered)  famotidine (PEPCID) tablet 10 mg (not administered)  sodium chloride 0.9 % bolus 1,000 mL (0 mLs Intravenous Stopped 05/19/17 1722)  potassium chloride SA (K-DUR,KLOR-CON) CR tablet 20 mEq (20 mEq Oral Given 05/19/17 1936)  insulin aspart (novoLOG) injection 8 Units (8 Units Subcutaneous Given 05/20/17 0000)     Initial Impression / Assessment and Plan / ED Course  I have reviewed the triage vital signs and the nursing notes.  Pertinent labs & imaging results that were available during my care of the patient were reviewed by me and considered in my medical decision making (see chart for details).     61 year old male with a history of chronic combined systolic and diastolic heart failure, CK D, coronary artery disease, diabetes, diabetic gastroparesis, OSA, history of osteomyelitis, pulmonary sarcoidosis, who presents after nephrologist had checked his labs and found that he had acute on chronic kidney injury. Last creatinine in December was 2.06, today is 5.61. There are no indications for emergent dialysis. Suspect acute  kidney injury secondary to new medication, dehydration, decreased appetite. Patient was given IV fluids, consulted hospitalist for admission.  Pt also reported chest burning x2 days, no acute ECG changes, normal troponin, doubt ACS.  Final Clinical Impressions(s) / ED Diagnoses   Final diagnoses:  Acute renal failure superimposed on stage 3 chronic kidney disease [facility])    New Prescriptions Current Discharge Medication List       Gareth Morgan, MD 05/20/17 [phone]

## 2017-05-20 DIAGNOSIS — N179 Acute kidney failure, unspecified: Principal | ICD-10-CM

## 2017-05-20 DIAGNOSIS — N183 Chronic kidney disease, stage 3 (moderate): Secondary | ICD-10-CM

## 2017-05-20 LAB — GLUCOSE, CAPILLARY
Glucose-Capillary: 329 mg/dL — ABNORMAL HIGH (ref 65–99)
Glucose-Capillary: 380 mg/dL — ABNORMAL HIGH (ref 65–99)
Glucose-Capillary: 315 mg/dL — ABNORMAL HIGH (ref 65–99)
Glucose-Capillary: 376 mg/dL — ABNORMAL HIGH (ref 65–99)

## 2017-05-20 LAB — CBC
HCT: 40.8 % (ref 39.0–52.0)
Hemoglobin: 13.9 g/dL (ref 13.0–17.0)
MCH: 27.8 pg (ref 26.0–34.0)
MCHC: 34.1 g/dL (ref 30.0–36.0)
MCV: 81.6 fL (ref 78.0–100.0)
Platelets: 190 10*3/uL (ref 150–400)
RBC: 5 MIL/uL (ref 4.22–5.81)
RDW: 14.3 % (ref 11.5–15.5)
WBC: 8.4 10*3/uL (ref 4.0–10.5)

## 2017-05-20 LAB — BASIC METABOLIC PANEL
Anion gap: 11 (ref 5–15)
BUN: 61 mg/dL — ABNORMAL HIGH (ref 6–20)
CO2: 23 mmol/L (ref 22–32)
Calcium: 9 mg/dL (ref 8.9–10.3)
Chloride: 99 mmol/L — ABNORMAL LOW (ref 101–111)
Creatinine, Ser: 4.88 mg/dL — ABNORMAL HIGH (ref 0.61–1.24)
GFR calc Af Amer: 14 mL/min — ABNORMAL LOW (ref >60)
GFR calc non Af Amer: 12 mL/min — ABNORMAL LOW (ref >60)
Glucose, Bld: 410 mg/dL — ABNORMAL HIGH (ref 65–99)
Potassium: 3.1 mmol/L — ABNORMAL LOW (ref 3.5–5.1)
Sodium: 133 mmol/L — ABNORMAL LOW (ref 135–145)

## 2017-05-20 LAB — SODIUM, URINE, RANDOM: Sodium, Ur: 18 mmol/L

## 2017-05-20 LAB — CREATININE, URINE, RANDOM: Creatinine, Urine: 84.59 mg/dL

## 2017-05-20 MED ORDER — SODIUM CHLORIDE 0.9 % IV SOLN
INTRAVENOUS | Status: AC
  Administered 2017-05-20: 11:00:00 via INTRAVENOUS

## 2017-05-20 MED ORDER — POTASSIUM CHLORIDE CRYS ER 20 MEQ PO TBCR
40.0000 meq | EXTENDED_RELEASE_TABLET | Freq: Once | ORAL | Status: AC
  Administered 2017-05-20: 40 meq via ORAL
  Filled 2017-05-20: qty 2

## 2017-05-20 NOTE — Progress Notes (Signed)
Patient arrived to bed 1345 tonight with ED staff via stretcher. Patient denies any pain or discomfort at this time. Complete head to toe assessment to follow. Patient educated on how/when to call for assistance. Will continue to monitor.

## 2017-05-20 NOTE — Progress Notes (Addendum)
Patient ID: Joshua [name], male   DOB: Aug 23, 1956, 61 y.o.   MRN: [id number]    PROGRESS NOTE    Joshua [name]  LTJ:[id number] DOB: 02/04/1956 DOA: 05/19/2017  PCP: [name], MD   Brief Narrative:  Patient is 61 year old male with known hypertension, insulin-dependent diabetes mellitus with complications of chronic kidney disease stage III, coronary artery disease and with stent placement, presenting to emergency department after his nephrologist referred him for an admission due to worsening renal injury. Patient did note a lack of energy for the past 2 weeks, decreased urine output as well as decrease oral intake.  In emergency department, patient was clinically stable, chemistry panel was notable for BUN of 70 and creatinine 5.61 which was up from prior value of 2.  Assessment & Plan:   Principal Problem:   Acute renal failure superimposed on stage 3 chronic kidney disease ([place]) - Suspect this is related to prerenal etiology - not entirely clear if also possible progression of chronic kidney disease  - patient was given 1 L normal saline in emergency department - creatinine is trending down, continue IVF - Patient eating better this morning - renal ultrasound notable for changes consistent with chronic renal medical disease, no hydronephrosis noted - will continue to monitor kidney function for now   Active Problems:   Benign essential HTN - Reasonable inpatient control for now - Continue Norvasc 10 mg QD, hydralazine 100 mg BID, Imdur 120 mg QD, metoprolol 150 mg QD    CAD S/P multiple PCIs - No anginal concerns this morning - continue Brilinta, aspirin 81, lovastatin, Toprol    Sleep apnea- on C-pap    DM type 2 with diabetic peripheral neuropathy (HCC) and nephropathy - Keep on insulin detemir 10 units twice a day  - continue sliding scale insulin, 4 units of NovoLog 3 times daily with meals    Hypokalemia - Supplement and repeat BMP in the morning   Hyponatremia - In the setting of hypovolemia - Has been given IV fluids in emergency department and continues through the evening - We'll continue to monitor  Morbid obesity  - Body mass index is 43.49 kg/m.  DVT prophylaxis: Heparin SQ Code Status: Full Family Communication: Patient at bedside  Disposition Plan: Home likely in the morning if creatinine continues trending down  Consultants:   None  Procedures:   None  Antimicrobials:   None  Subjective: Patient reports feeling better, better appetite, no chest pain and no dyspnea.  Objective: Vitals:   05/19/17 1708 05/19/17 2037 05/20/17 0614 05/20/17 0820  BP: 139/75 (!) 147/86 130/74   Pulse: 66 68 62   Resp: $Remo'15 18 16   'vkNja$ Temp: 97.6 F (36.4 C) 98 F (36.7 C) 98 F (36.7 C)   TempSrc: Oral Oral Oral   SpO2: 95% 100% 98%   Weight:  135 kg (297 lb 11.2 oz)  133.6 kg (294 lb 8 oz)  Height:  $Remove'5\' 9"'BgEeADl$  (1.753 m)      Intake/Output Summary (Last 24 hours) at 05/20/17 0902 Last data filed at 05/20/17 0614  Gross per 24 hour  Intake             2420 ml  Output             1425 ml  Net              995 ml   Filed Weights   05/19/17 1408 05/19/17 2037 05/20/17 0820  Weight: 136.1 kg (300 lb)  135 kg (297 lb 11.2 oz) 133.6 kg (294 lb 8 oz)    Examination:  General exam: Appears calm and comfortable  Respiratory system: Clear to auscultation. Respiratory effort normal. Cardiovascular system: S1 & S2 heard, RRR. No JVD, rubs, gallops or clicks. No pedal edema. Gastrointestinal system: Abdomen is nondistended, soft and nontender. No organomegaly or masses felt.  Central nervous system: Alert and oriented. No focal neurological deficits. Extremities: Symmetric 5 x 5 power. Skin: No rashes, lesions or ulcers Psychiatry: Judgement and insight appear normal. Mood & affect appropriate.   Data Reviewed: I have personally reviewed following labs and imaging studies  CBC:  Recent Labs Lab 05/19/17 1551 05/20/17 0417    WBC 8.6 8.4  NEUTROABS 6.2  --   HGB 13.6 13.9  HCT 38.8* 40.8  MCV 80.7 81.6  PLT 183 190   Basic Metabolic Panel:  Recent Labs Lab 05/19/17 1551 05/20/17 0417  NA 131* 133*  K 3.2* 3.1*  CL 95* 99*  CO2 23 23  GLUCOSE 474* 410*  BUN 70* 61*  CREATININE 5.61* 4.88*  CALCIUM 8.7* 9.0   Liver Function Tests:  Recent Labs Lab 05/19/17 1551  AST 19  ALT 23  ALKPHOS 115  BILITOT 0.6  PROT 7.4  ALBUMIN 3.5   Cardiac Enzymes:  Recent Labs Lab 05/19/17 1551  CKTOTAL 601*  TROPONINI <0.03   CBG:  Recent Labs Lab 05/19/17 2034 05/20/17 0756  GLUCAP 470* 329*   Urine analysis:    Component Value Date/Time   COLORURINE YELLOW 05/19/2017 1544   APPEARANCEUR HAZY (A) 05/19/2017 1544   LABSPEC 1.009 05/19/2017 1544   PHURINE 5.0 05/19/2017 1544   GLUCOSEU >=500 (A) 05/19/2017 1544   HGBUR MODERATE (A) 05/19/2017 1544   BILIRUBINUR NEGATIVE 05/19/2017 1544   KETONESUR NEGATIVE 05/19/2017 1544   PROTEINUR 30 (A) 05/19/2017 1544   UROBILINOGEN 0.2 11/01/2011 1410   NITRITE NEGATIVE 05/19/2017 1544   LEUKOCYTESUR NEGATIVE 05/19/2017 1544   Radiology Studies: US Renal  Result Date: 05/19/2017 CLINICAL DATA:  Acute renal failure on chronic kidney disease. EXAM: RENAL / URINARY TRACT ULTRASOUND COMPLETE COMPARISON:  11/29/2016 FINDINGS: Right Kidney: Length: 10.9 cm. Echogenic renal parenchyma. No focal mass or hydronephrosis. Left Kidney: Length: 10.3 cm. Echogenic renal parenchyma. No focal mass or hydronephrosis. Bladder: Appears normal for degree of bladder distention. Bilateral ureteral jets are present. IMPRESSION: 1. No hydronephrosis. 2. Echogenic renal parenchyma consistent with the history of chronic kidney disease. Electronically Signed   By: Norva Pavlov M.D.   On: 05/19/2017 19:00    Scheduled Meds: . allopurinol  300 mg Oral Daily  . amLODipine  10 mg Oral Daily  . aspirin EC  81 mg Oral Daily  . cholecalciferol  5,000 Units Oral Daily  .  heparin  5,000 Units Subcutaneous Q8H  . hydrALAZINE  100 mg Oral BID  . insulin aspart  0-15 Units Subcutaneous TID WC  . insulin aspart  0-5 Units Subcutaneous QHS  . insulin aspart  4 Units Subcutaneous TID WC  . insulin detemir  10 Units Subcutaneous BID  . isosorbide mononitrate  120 mg Oral Daily  . metoprolol succinate  150 mg Oral Daily  . pantoprazole  40 mg Oral Daily  . rosuvastatin  40 mg Oral q1800  . ticagrelor  90 mg Oral BID   Continuous Infusions:   LOS: 1 day   Time spent: 35 minutes   Debbora Presto, MD [facility] Pager [phone]  If 7PM-7AM, please contact night-coverage  [email or web address] Password [email or web address] 05/20/2017, 9:02 AM

## 2017-05-21 LAB — GLUCOSE, CAPILLARY
Glucose-Capillary: 338 mg/dL — ABNORMAL HIGH (ref 65–99)
Glucose-Capillary: 300 mg/dL — ABNORMAL HIGH (ref 65–99)
Glucose-Capillary: 508 mg/dL (ref 65–99)
Glucose-Capillary: 247 mg/dL — ABNORMAL HIGH (ref 65–99)

## 2017-05-21 LAB — BASIC METABOLIC PANEL
Anion gap: 10 (ref 5–15)
BUN: 51 mg/dL — ABNORMAL HIGH (ref 6–20)
CO2: 23 mmol/L (ref 22–32)
Calcium: 8.7 mg/dL — ABNORMAL LOW (ref 8.9–10.3)
Chloride: 103 mmol/L (ref 101–111)
Creatinine, Ser: 4.5 mg/dL — ABNORMAL HIGH (ref 0.61–1.24)
GFR calc Af Amer: 15 mL/min — ABNORMAL LOW (ref >60)
GFR calc non Af Amer: 13 mL/min — ABNORMAL LOW (ref >60)
Glucose, Bld: 293 mg/dL — ABNORMAL HIGH (ref 65–99)
Potassium: 3.1 mmol/L — ABNORMAL LOW (ref 3.5–5.1)
Sodium: 136 mmol/L (ref 135–145)

## 2017-05-21 LAB — UREA NITROGEN, URINE: Urea Nitrogen, Ur: 451 mg/dL

## 2017-05-21 LAB — HEMOGLOBIN A1C
Hgb A1c MFr Bld: 11.8 % — ABNORMAL HIGH (ref 4.8–5.6)
Mean Plasma Glucose: 292 mg/dL

## 2017-05-21 LAB — CBC
HCT: 39.2 % (ref 39.0–52.0)
Hemoglobin: 13 g/dL (ref 13.0–17.0)
MCH: 27.1 pg (ref 26.0–34.0)
MCHC: 33.2 g/dL (ref 30.0–36.0)
MCV: 81.8 fL (ref 78.0–100.0)
Platelets: 189 10*3/uL (ref 150–400)
RBC: 4.79 MIL/uL (ref 4.22–5.81)
RDW: 14.5 % (ref 11.5–15.5)
WBC: 7.2 10*3/uL (ref 4.0–10.5)

## 2017-05-21 LAB — GLUCOSE, RANDOM: Glucose, Bld: 366 mg/dL — ABNORMAL HIGH (ref 65–99)

## 2017-05-21 MED ORDER — INSULIN ASPART 100 UNIT/ML ~~LOC~~ SOLN
7.0000 [IU] | Freq: Three times a day (TID) | SUBCUTANEOUS | Status: DC
  Administered 2017-05-22 – 2017-05-23 (×5): 7 [IU] via SUBCUTANEOUS

## 2017-05-21 MED ORDER — POTASSIUM CHLORIDE IN NACL 20-0.9 MEQ/L-% IV SOLN
INTRAVENOUS | Status: DC
  Administered 2017-05-21 – 2017-05-22 (×2): via INTRAVENOUS
  Administered 2017-05-22: 1000 mL via INTRAVENOUS
  Administered 2017-05-23: 07:00:00 via INTRAVENOUS
  Filled 2017-05-21 (×4): qty 1000

## 2017-05-21 NOTE — Progress Notes (Signed)
Patient states that he will self-administer CPAP when ready for bed.  Patient is familiar with equipment and procedure and was instructed to contact RN or RT with any issues or concerns.

## 2017-05-21 NOTE — Progress Notes (Signed)
Blood sugar 508, DR Doyle Askew made aware,administered total of 19 units regular insulin,random blood sugar done by lab.

## 2017-05-21 NOTE — Progress Notes (Signed)
Patient ID: Joshua [name], male   DOB: 1956/07/25, 61 y.o.   MRN: [id number]    PROGRESS NOTE    Joshua [name]  [id number] DOB: 01-16-1956 DOA: 05/19/2017  PCP: [name], MD   Brief Narrative:  Patient is 61 year old male with known hypertension, insulin-dependent diabetes mellitus with complications of chronic kidney disease stage III, coronary artery disease and with stent placement, presenting to emergency department after his nephrologist referred him for an admission due to worsening renal injury. Patient did note a lack of energy for the past 2 weeks, decreased urine output as well as decrease oral intake.  In emergency department, patient was clinically stable, chemistry panel was notable for BUN of 70 and creatinine 5.61 which was up from prior value of 2.  Assessment & Plan:   Principal Problem:   Acute renal failure superimposed on stage 3 chronic kidney disease ([place]) - Suspect this is related to prerenal etiology - not entirely clear if also possible progression of chronic kidney disease  - patient was given 1 L normal saline in emergency department - pt reports feeling better but still not at baseline, has nausea and fatigue  - so far tolerating diet well, appetite better - Cr continues trending down - will continue IVF, repeat BMP in AM  Active Problems:   Benign essential HTN - BP stable - current meds: Norvasc 10 mg QD, Hydralazine 100 mg BID, Imdur 120 mg QD, Metoprolol 150 mg QD    CAD S/P multiple PCIs - no chest pain  - continue Brilinta, aspirin, lovastatin, Toprol     Sleep apnea- on C-pap    DM type 2 with diabetic peripheral neuropathy (HCC) and nephropathy - continue Insulin detemir, SSI, meal coverage 4 U TID    Hypokalemia - supplement with IVF    Hyponatremia - In the setting of hypovolemia - resolved with IVF  Morbid obesity  - Body mass index is 43.49 kg/m.  DVT prophylaxis: Heparin SQ Code Status: Full Family  Communication: Patient at bedside  Disposition Plan: Home likely in 1-2 days when creatinine more stable   Consultants:   None  Procedures:   None  Antimicrobials:   None  Subjective: Pt report feeling better but still with fatigue and nausea.   Objective: Vitals:   05/20/17 0820 05/20/17 1430 05/20/17 2200 05/21/17 0632  BP:  (!) 128/58 123/74 (!) 149/97  Pulse:  71 66 66  Resp:  $Remo'18 19 15  'oonrZ$ Temp:  98.4 F (36.9 C) 98 F (36.7 C) 97.4 F (36.3 C)  TempSrc:  Oral Oral Oral  SpO2:  96% 97% 100%  Weight: 133.6 kg (294 lb 8 oz)   (!) 136.3 kg (300 lb 8 oz)  Height:        Intake/Output Summary (Last 24 hours) at 05/21/17 1129 Last data filed at 05/21/17 0802  Gross per 24 hour  Intake          1693.75 ml  Output             2325 ml  Net          -631.25 ml   Filed Weights   05/19/17 2037 05/20/17 0820 05/21/17 9562  Weight: 135 kg (297 lb 11.2 oz) 133.6 kg (294 lb 8 oz) (!) 136.3 kg (300 lb 8 oz)    Physical Exam  Constitutional: Appears well-developed and well-nourished. No distress.  CVS: RRR, S1/S2 +, no murmurs, no gallops, no carotid bruit.  Pulmonary: Effort and breath sounds  normal, no stridor, rhonchi, wheezes, rales.  Abdominal: Soft. BS +,  no distension, tenderness, rebound or guarding.  Musculoskeletal: Normal range of motion. No edema and no tenderness.   Data Reviewed: I have personally reviewed following labs and imaging studies  CBC:  Recent Labs Lab 05/19/17 1551 05/20/17 0417 05/21/17 0337  WBC 8.6 8.4 7.2  NEUTROABS 6.2  --   --   HGB 13.6 13.9 13.0  HCT 38.8* 40.8 39.2  MCV 80.7 81.6 81.8  PLT 183 190 009   Basic Metabolic Panel:  Recent Labs Lab 05/19/17 1551 05/20/17 0417 05/21/17 0337  NA 131* 133* 136  K 3.2* 3.1* 3.1*  CL 95* 99* 103  CO2 $Re'23 23 23  'aWK$ GLUCOSE 474* 410* 293*  BUN 70* 61* 51*  CREATININE 5.61* 4.88* 4.50*  CALCIUM 8.7* 9.0 8.7*   Liver Function Tests:  Recent Labs Lab 05/19/17 1551  AST 19  ALT  23  ALKPHOS 115  BILITOT 0.6  PROT 7.4  ALBUMIN 3.5   Cardiac Enzymes:  Recent Labs Lab 05/19/17 1551  CKTOTAL 601*  TROPONINI <0.03   CBG:  Recent Labs Lab 05/20/17 0756 05/20/17 1139 05/20/17 1717 05/20/17 2155 05/21/17 0800  GLUCAP 329* 380* 315* 376* 338*   Urine analysis:    Component Value Date/Time   COLORURINE YELLOW 05/19/2017 1544   APPEARANCEUR HAZY (A) 05/19/2017 1544   LABSPEC 1.009 05/19/2017 1544   PHURINE 5.0 05/19/2017 1544   GLUCOSEU >=500 (A) 05/19/2017 1544   HGBUR MODERATE (A) 05/19/2017 1544   BILIRUBINUR NEGATIVE 05/19/2017 1544   KETONESUR NEGATIVE 05/19/2017 1544   PROTEINUR 30 (A) 05/19/2017 1544   UROBILINOGEN 0.2 11/01/2011 1410   NITRITE NEGATIVE 05/19/2017 1544   LEUKOCYTESUR NEGATIVE 05/19/2017 1544   Radiology Studies: US Renal  Result Date: 05/19/2017 CLINICAL DATA:  Acute renal failure on chronic kidney disease. EXAM: RENAL / URINARY TRACT ULTRASOUND COMPLETE COMPARISON:  11/29/2016 FINDINGS: Right Kidney: Length: 10.9 cm. Echogenic renal parenchyma. No focal mass or hydronephrosis. Left Kidney: Length: 10.3 cm. Echogenic renal parenchyma. No focal mass or hydronephrosis. Bladder: Appears normal for degree of bladder distention. Bilateral ureteral jets are present. IMPRESSION: 1. No hydronephrosis. 2. Echogenic renal parenchyma consistent with the history of chronic kidney disease. Electronically Signed   By: Nolon Nations M.D.   On: 05/19/2017 19:00    Scheduled Meds: . allopurinol  300 mg Oral Daily  . amLODipine  10 mg Oral Daily  . aspirin EC  81 mg Oral Daily  . cholecalciferol  5,000 Units Oral Daily  . heparin  5,000 Units Subcutaneous Q8H  . hydrALAZINE  100 mg Oral BID  . insulin aspart  0-15 Units Subcutaneous TID WC  . insulin aspart  0-5 Units Subcutaneous QHS  . insulin aspart  4 Units Subcutaneous TID WC  . insulin detemir  10 Units Subcutaneous BID  . isosorbide mononitrate  120 mg Oral Daily  . metoprolol  succinate  150 mg Oral Daily  . pantoprazole  40 mg Oral Daily  . rosuvastatin  40 mg Oral q1800  . ticagrelor  90 mg Oral BID   Continuous Infusions: . 0.9 % NaCl with KCl 20 mEq / L      LOS: 2 days   Time spent: 35 minutes   Faye Ramsay, MD [facility] Pager [phone]  If 7PM-7AM, please contact night-coverage [email or web address] Password [email or web address] 05/21/2017, 11:29 AM

## 2017-05-22 LAB — CBC
HCT: 39.1 % (ref 39.0–52.0)
Hemoglobin: 13.1 g/dL (ref 13.0–17.0)
MCH: 27.6 pg (ref 26.0–34.0)
MCHC: 33.5 g/dL (ref 30.0–36.0)
MCV: 82.5 fL (ref 78.0–100.0)
Platelets: 187 10*3/uL (ref 150–400)
RBC: 4.74 MIL/uL (ref 4.22–5.81)
RDW: 14.4 % (ref 11.5–15.5)
WBC: 7.4 10*3/uL (ref 4.0–10.5)

## 2017-05-22 LAB — BASIC METABOLIC PANEL
Anion gap: 9 (ref 5–15)
BUN: 51 mg/dL — ABNORMAL HIGH (ref 6–20)
CO2: 23 mmol/L (ref 22–32)
Calcium: 8.6 mg/dL — ABNORMAL LOW (ref 8.9–10.3)
Chloride: 104 mmol/L (ref 101–111)
Creatinine, Ser: 4.16 mg/dL — ABNORMAL HIGH (ref 0.61–1.24)
GFR calc Af Amer: 17 mL/min — ABNORMAL LOW (ref >60)
GFR calc non Af Amer: 14 mL/min — ABNORMAL LOW (ref >60)
Glucose, Bld: 252 mg/dL — ABNORMAL HIGH (ref 65–99)
Potassium: 3.2 mmol/L — ABNORMAL LOW (ref 3.5–5.1)
Sodium: 136 mmol/L (ref 135–145)

## 2017-05-22 LAB — GLUCOSE, CAPILLARY
Glucose-Capillary: 230 mg/dL — ABNORMAL HIGH (ref 65–99)
Glucose-Capillary: 312 mg/dL — ABNORMAL HIGH (ref 65–99)
Glucose-Capillary: 233 mg/dL — ABNORMAL HIGH (ref 65–99)
Glucose-Capillary: 199 mg/dL — ABNORMAL HIGH (ref 65–99)

## 2017-05-22 MED ORDER — POTASSIUM CHLORIDE CRYS ER 20 MEQ PO TBCR
40.0000 meq | EXTENDED_RELEASE_TABLET | Freq: Once | ORAL | Status: AC
  Administered 2017-05-22: 40 meq via ORAL
  Filled 2017-05-22: qty 2

## 2017-05-22 MED ORDER — INSULIN DETEMIR 100 UNIT/ML ~~LOC~~ SOLN
20.0000 [IU] | Freq: Two times a day (BID) | SUBCUTANEOUS | Status: DC
  Administered 2017-05-22 – 2017-05-23 (×2): 20 [IU] via SUBCUTANEOUS
  Filled 2017-05-22 (×2): qty 0.2

## 2017-05-22 MED ORDER — HYDRALAZINE HCL 20 MG/ML IJ SOLN: 5.0000 mg | INTRAMUSCULAR | Status: DC | PRN

## 2017-05-22 NOTE — Progress Notes (Signed)
Pt states that he will self administer CPAP if he decides to wear it tonight.  RT to monitor and assess as needed.

## 2017-05-22 NOTE — Progress Notes (Signed)
Patient ID: Joshua [name], male   DOB: 03/21/1956, 61 y.o.   MRN: [id number]    PROGRESS NOTE    Joshua [name]  [id number] DOB: 05/28/1956 DOA: 05/19/2017  PCP: [name], MD   Brief Narrative:  Patient is 61 year old male with known hypertension, insulin-dependent diabetes mellitus with complications of chronic kidney disease stage III, coronary artery disease and with stent placement, presenting to emergency department after his nephrologist referred him for an admission due to worsening renal injury. Patient did note a lack of energy for the past 2 weeks, decreased urine output as well as decrease oral intake.  In emergency department, patient was clinically stable, chemistry panel was notable for BUN of 70 and creatinine 5.61 which was up from prior value of 2.  Assessment & Plan:   Principal Problem:   Acute renal failure superimposed on stage 3 chronic kidney disease (H[facility]) - FENa 0.9, pre renal etiology suspected - has been on IVF and Cr is trending down from 5.6 on admission --> 4.1 this AM - pt tolerating IVF well and tolerating diet well but still does not feel back to baseline - keep on IVF 24 more hours and repeat BMP in AM  Active Problems:   Benign essential HTN - current meds: Norvasc 10 mg QD, Hydralazine 100 mg BID, Imdur 120 mg QD, Metoprolol 150 mg QD - SBP in 160's, will add hydralazine as needed     CAD S/P multiple PCIs - no chest pain this AM  - continue Brilinta, aspirin, lovastatin, Toprol     Sleep apnea- on C-pap    DM type 2 with diabetic peripheral neuropathy (HCC) and nephropathy - continue Insulin detemir, SSI, meal coverage 4 U TID - CBG's in 250's, will increase Levemir from 10 --> 20 U for better control     Hypokalemia - still low, supplement, BMP in AM    Hyponatremia - resolved with IVF  Morbid obesity  - Body mass index is 43.49 kg/m.  DVT prophylaxis: Heparin SQ Code Status: Full Family Communication: pt at bedside    Disposition Plan: home when cr somewhat better   Consultants:   None  Procedures:   None  Antimicrobials:   None  Subjective: Pt reports feeling better but still with nausea and poor oral intake.   Objective: Vitals:   05/21/17 2157 05/22/17 0651 05/22/17 1118 05/22/17 1119  BP: 119/61 (!) 172/99 (!) 168/88 (!) 168/88  Pulse: 60 69    Resp: 16 18    Temp: 98.2 F (36.8 C) 98.4 F (36.9 C)    TempSrc: Oral Oral    SpO2: 99% 98%    Weight:  135.9 kg (299 lb 11.2 oz)    Height:        Intake/Output Summary (Last 24 hours) at 05/22/17 1234 Last data filed at 05/22/17 0800  Gross per 24 hour  Intake             2255 ml  Output             1500 ml  Net              755 ml   Filed Weights   05/20/17 0820 05/21/17 0632 05/22/17 0651  Weight: 133.6 kg (294 lb 8 oz) (!) 136.3 kg (300 lb 8 oz) 135.9 kg (299 lb 11.2 oz)    Physical Exam  Constitutional: Appears calm. No distress.  CVS: RRR, S1/S2 +, no murmurs, no gallops, no carotid bruit.  Pulmonary:  Effort and breath sounds normal, no stridor, rhonchi, wheezes, rales.  Abdominal: Soft. BS +,  no distension, tenderness, rebound or guarding.  Musculoskeletal: Normal range of motion. No edema and no tenderness.   Data Reviewed: I have personally reviewed following labs and imaging studies  CBC:  Recent Labs Lab 05/19/17 1551 05/20/17 0417 05/21/17 0337 05/22/17 0346  WBC 8.6 8.4 7.2 7.4  NEUTROABS 6.2  --   --   --   HGB 13.6 13.9 13.0 13.1  HCT 38.8* 40.8 39.2 39.1  MCV 80.7 81.6 81.8 82.5  PLT 183 190 189 707   Basic Metabolic Panel:  Recent Labs Lab 05/19/17 1551 05/20/17 0417 05/21/17 0337 05/21/17 1744 05/22/17 0346  NA 131* 133* 136  --  136  K 3.2* 3.1* 3.1*  --  3.2*  CL 95* 99* 103  --  104  CO2 $Re'23 23 23  'rGI$ --  23  GLUCOSE 474* 410* 293* 366* 252*  BUN 70* 61* 51*  --  51*  CREATININE 5.61* 4.88* 4.50*  --  4.16*  CALCIUM 8.7* 9.0 8.7*  --  8.6*   Liver Function Tests:  Recent  Labs Lab 05/19/17 1551  AST 19  ALT 23  ALKPHOS 115  BILITOT 0.6  PROT 7.4  ALBUMIN 3.5   Cardiac Enzymes:  Recent Labs Lab 05/19/17 1551  CKTOTAL 601*  TROPONINI <0.03   CBG:  Recent Labs Lab 05/21/17 1159 05/21/17 1708 05/21/17 2159 05/22/17 0743 05/22/17 1201  GLUCAP 300* 508* 247* 230* 312*   Urine analysis:    Component Value Date/Time   COLORURINE YELLOW 05/19/2017 1544   APPEARANCEUR HAZY (A) 05/19/2017 1544   LABSPEC 1.009 05/19/2017 1544   PHURINE 5.0 05/19/2017 1544   GLUCOSEU >=500 (A) 05/19/2017 1544   HGBUR MODERATE (A) 05/19/2017 1544   BILIRUBINUR NEGATIVE 05/19/2017 1544   KETONESUR NEGATIVE 05/19/2017 1544   PROTEINUR 30 (A) 05/19/2017 1544   UROBILINOGEN 0.2 11/01/2011 1410   NITRITE NEGATIVE 05/19/2017 1544   LEUKOCYTESUR NEGATIVE 05/19/2017 1544   Radiology Studies: No results found.  Scheduled Meds: . allopurinol  300 mg Oral Daily  . amLODipine  10 mg Oral Daily  . aspirin EC  81 mg Oral Daily  . cholecalciferol  5,000 Units Oral Daily  . heparin  5,000 Units Subcutaneous Q8H  . hydrALAZINE  100 mg Oral BID  . insulin aspart  0-15 Units Subcutaneous TID WC  . insulin aspart  0-5 Units Subcutaneous QHS  . insulin aspart  7 Units Subcutaneous TID WC  . insulin detemir  10 Units Subcutaneous BID  . isosorbide mononitrate  120 mg Oral Daily  . metoprolol succinate  150 mg Oral Daily  . pantoprazole  40 mg Oral Daily  . rosuvastatin  40 mg Oral q1800  . ticagrelor  90 mg Oral BID   Continuous Infusions: . 0.9 % NaCl with KCl 20 mEq / L 75 mL/hr at 05/22/17 0220    LOS: 3 days   Time spent: 35 minutes   Faye Ramsay, MD [facility] Pager [phone]  If 7PM-7AM, please contact night-coverage [email or web address] Password [email or web address] 05/22/2017, 12:34 PM

## 2017-05-23 LAB — CBC
HCT: 39.4 % (ref 39.0–52.0)
Hemoglobin: 13.2 g/dL (ref 13.0–17.0)
MCH: 27.3 pg (ref 26.0–34.0)
MCHC: 33.5 g/dL (ref 30.0–36.0)
MCV: 81.6 fL (ref 78.0–100.0)
Platelets: 194 10*3/uL (ref 150–400)
RBC: 4.83 MIL/uL (ref 4.22–5.81)
RDW: 14.7 % (ref 11.5–15.5)
WBC: 6.7 10*3/uL (ref 4.0–10.5)

## 2017-05-23 LAB — BASIC METABOLIC PANEL WITH GFR
Anion gap: 7 (ref 5–15)
BUN: 48 mg/dL — ABNORMAL HIGH (ref 6–20)
CO2: 23 mmol/L (ref 22–32)
Calcium: 8.9 mg/dL (ref 8.9–10.3)
Chloride: 108 mmol/L (ref 101–111)
Creatinine, Ser: 3.7 mg/dL — ABNORMAL HIGH (ref 0.61–1.24)
GFR calc Af Amer: 19 mL/min — ABNORMAL LOW
GFR calc non Af Amer: 17 mL/min — ABNORMAL LOW
Glucose, Bld: 241 mg/dL — ABNORMAL HIGH (ref 65–99)
Potassium: 3.6 mmol/L (ref 3.5–5.1)
Sodium: 138 mmol/L (ref 135–145)

## 2017-05-23 LAB — GLUCOSE, CAPILLARY
Glucose-Capillary: 223 mg/dL — ABNORMAL HIGH (ref 65–99)
Glucose-Capillary: 287 mg/dL — ABNORMAL HIGH (ref 65–99)

## 2017-05-23 NOTE — Discharge Instructions (Signed)
Acute Kidney Injury, Adult Acute kidney injury is a sudden worsening of kidney function. The kidneys are organs that have several jobs. They filter the blood to remove waste products and extra fluid. They also maintain a healthy balance of minerals and hormones in the body, which helps control blood pressure and keep bones strong. With this condition, your kidneys do not do their jobs as well as they should. This condition ranges from mild to severe. Over time it may develop into long-lasting (chronic) kidney disease. Early detection and treatment may prevent acute kidney injury from developing into a chronic condition. What are the causes? Common causes of this condition include:  A problem with blood flow to the kidneys. This may be caused by:  Low blood pressure (hypotension) or shock.  Blood loss.  Heart and blood vessel (cardiovascular) disease.  Severe burns.  Liver disease.  Direct damage to the kidneys. This may be caused by:  Certain medicines.  A kidney infection.  Poisoning.  Being around or in contact with toxic substances.  A surgical wound.  A hard, direct hit to the kidney area.  A sudden blockage of urine flow. This may be caused by:  Cancer.  Kidney stones.  An enlarged prostate in males. What are the signs or symptoms? Symptoms of this condition may not be obvious until the condition becomes severe. Symptoms of this condition can include:  Tiredness (lethargy), or difficulty staying awake.  Nausea or vomiting.  Swelling (edema) of the face, legs, ankles, or feet.  Problems with urination, such as:  Abdominal pain, or pain along the side of your stomach (flank).  Decreased urine production.  Decrease in the force of urine flow.  Muscle twitches and cramps, especially in the legs.  Confusion or trouble concentrating.  Loss of appetite.  Fever. How is this diagnosed? This condition may be diagnosed with tests, including:  Blood  tests.  Urine tests.  Imaging tests.  A test in which a sample of tissue is removed from the kidneys to be examined under a microscope (kidney biopsy). How is this treated? Treatment for this condition depends on the cause and how severe the condition is. In mild cases, treatment may not be needed. The kidneys may heal on their own. In more severe cases, treatment will involve:  Treating the cause of the kidney injury. This may involve changing any medicines you are taking or adjusting your dosage.  Fluids. You may need specialized IV fluids to balance your body's needs.  Having a catheter placed to drain urine and prevent blockages.  Preventing problems from occurring. This may mean avoiding certain medicines or procedures that can cause further injury to the kidneys. In some cases treatment may also require:  A procedure to remove toxic wastes from the body (dialysis or continuous renal replacement therapy - CRRT).  Surgery. This may be done to repair a torn kidney, or to remove the blockage from the urinary system. Follow these instructions at home: Medicines   Take over-the-counter and prescription medicines only as told by your health care provider.  Do not take any new medicines without your health care provider's approval. Many medicines can worsen your kidney damage.  Do not take any vitamin and mineral supplements without your health care provider's approval. Many nutritional supplements can worsen your kidney damage. Lifestyle   If your health care provider prescribed changes to your diet, follow them. You may need to decrease the amount of protein you eat.  Achieve and maintain a   healthy weight. If you need help with this, ask your health care provider.  Start or continue an exercise plan. Try to exercise at least 30 minutes a day, 5 days a week.  Do not use any tobacco products, such as cigarettes, chewing tobacco, and e-cigarettes. If you need help quitting, ask  your health care provider. General instructions   Keep track of your blood pressure. Report changes in your blood pressure as told by your health care provider.  Stay up to date with immunizations. Ask your health care provider which immunizations you need.  Keep all follow-up visits as told by your health care provider. This is important. Where to find more information:  American Association of Kidney Patients: www.aakp.org  National Kidney Foundation: www.kidney.org  American Kidney Fund: www.akfinc.org  [employer] [facility]:  www.lifeoptions.org  www.kidneyschool.org Contact a health care provider if:  Your symptoms get worse.  You develop new symptoms. Get help right away if:  You develop symptoms of worsening kidney disease, which include:  Headaches.  Abnormally dark or light skin.  Easy bruising.  Frequent hiccups.  Chest pain.  Shortness of breath.  End of menstruation in women.  Seizures.  Confusion or altered mental status.  Abdominal or back pain.  Itchiness.  You have a fever.  Your body is producing less urine.  You have pain or bleeding when you urinate. Summary  Acute kidney injury is a sudden worsening of kidney function.  Acute kidney injury can be caused by problems with blood flow to the kidneys, direct damage to the kidneys, and sudden blockage of urine flow.  Symptoms of this condition may not be obvious until it becomes severe. Symptoms may include edema, lethargy, confusion, nausea or vomiting, and problems passing urine.  This condition can usually be diagnosed with blood tests, urine tests, and imaging tests. Sometimes a kidney biopsy is done to diagnose this condition.  Treatment for this condition often involves treating the underlying cause. It is treated with fluids, medicines, dialysis, diet changes, or surgery. This information is not intended to replace advice given to you by your health care provider.  Make sure you discuss any questions you have with your health care provider. Document Released: 06/14/2011 Document Revised: 11/19/2016 Document Reviewed: 11/19/2016 [employer]vier Interactive Patient Education  2017 [employer]  

## 2017-05-23 NOTE — Discharge Summary (Signed)
Physician Discharge Summary  ABID BOLLA KVQ:[id number] DOB: 21-Jan-1956 DOA: 05/19/2017  PCP: [name], MD  Admit date: 05/19/2017 Discharge date: 05/23/2017  Recommendations for Outpatient Follow-up:  1. Pt will need to follow up with PCP in 1-2 weeks post discharge 2. Please obtain BMP to evaluate electrolytes and kidney function 3. Please note that chlorthalidone was discontinued   Discharge Diagnoses:  Principal Problem:   Acute renal failure superimposed on stage 3 chronic kidney disease (HCC) Active Problems:   Benign essential HTN   CAD S/P multiple PCIs   Sleep apnea- on C-pap   CKD (chronic kidney disease) stage 3, GFR 30-59 ml/min   DM type 2 with diabetic peripheral neuropathy (HCC)   AKI (acute kidney injury) ([place])   Essential hypertension  Discharge Condition: Stable  Diet recommendation: Heart healthy diet discussed in details   History of present illness:  Patient is 61 year old male with known hypertension, insulin-dependent diabetes mellitus with complications of chronic kidney disease stage III, coronary artery disease and with stent placement, presenting to emergency department after his nephrologist referred him for an admission due to worsening renal injury. Patient did note a lack of energy for the past 2 weeks, decreased urine output as well as decrease oral intake.  In emergency department, patient was clinically stable, chemistry panel was notable for BUN of 70 and creatinine 5.61 which was up from prior value of 2.  Assessment & Plan:   Principal Problem:   Acute renal failure superimposed on stage 3 chronic kidney disease (HCC) - FENa 0.9, pre renal etiology suspected - has been on IVF and Cr is trending down from 5.6 on admission --> 4.1 --> 3.7 this AM - pt tolerating diet well, OK to go home with close follow up  Active Problems:   Benign essential HTN - current meds: Norvasc 10 mg QD, Hydralazine 100 mg BID, Imdur 120 mg QD,  Metoprolol 150 mg QD - continue same medical regimen at home     CAD S/P multiple PCIs  - no chest pain this AM - continue Brilinta, aspirin, lovastatin, Toprol     Sleep apnea- on C-pap    DM type 2 with diabetic peripheral neuropathy (HCC) and nephropathy - resume home medical regimen     Hypokalemia - supplemented and WNL    Hyponatremia - resolved with IVF  Morbid obesity  - Body mass index is 43.49 kg/m.  DVT prophylaxis: Heparin SQ Code Status: Full Family Communication: Pt at bedside  Disposition Plan: Home  Consultants:   None  Procedures:   None  Antimicrobials:   None Procedures/Studies: US Renal  Result Date: 05/19/2017 CLINICAL DATA:  Acute renal failure on chronic kidney disease. EXAM: RENAL / URINARY TRACT ULTRASOUND COMPLETE COMPARISON:  11/29/2016 FINDINGS: Right Kidney: Length: 10.9 cm. Echogenic renal parenchyma. No focal mass or hydronephrosis. Left Kidney: Length: 10.3 cm. Echogenic renal parenchyma. No focal mass or hydronephrosis. Bladder: Appears normal for degree of bladder distention. Bilateral ureteral jets are present. IMPRESSION: 1. No hydronephrosis. 2. Echogenic renal parenchyma consistent with the history of chronic kidney disease. Electronically Signed   By: Nolon Nations M.D.   On: 05/19/2017 19:00   Discharge Exam: Vitals:   05/22/17 2336 05/23/17 0648  BP: 121/68 (!) 171/91  Pulse: 73 81  Resp: 18 20  Temp: 98.6 F (37 C) 98.8 F (37.1 C)   Vitals:   05/22/17 1119 05/22/17 1539 05/22/17 2336 05/23/17 0648  BP: (!) 168/88 125/74 121/68 (!) 171/91  Pulse:  78 74 73 81  Resp:  $Remo'16 18 20  'yRenv$ Temp:  98.1 F (36.7 C) 98.6 F (37 C) 98.8 F (37.1 C)  TempSrc:  Oral Oral Oral  SpO2:  98% 100% 100%  Weight:      Height:        General: Pt is alert, follows commands appropriately, not in acute distress Cardiovascular: Regular rate and rhythm, S1/S2 +, no murmurs, no rubs, no gallops Respiratory: Clear to  auscultation bilaterally, no wheezing, no crackles, no rhonchi Abdominal: Soft, non tender, non distended, bowel sounds +, no guarding  Discharge Instructions  Discharge Instructions    Diet - low sodium heart healthy    Complete by:  As directed    Increase activity slowly    Complete by:  As directed      Allergies as of 05/23/2017      Reactions   Chlorthalidone       Medication List    STOP taking these medications   chlorthalidone 25 MG tablet Commonly known as:  HYGROTON     TAKE these medications   acetaminophen 325 MG tablet Commonly known as:  TYLENOL Take 2 tablets (650 mg total) by mouth every 4 (four) hours as needed for headache or mild pain.   allopurinol 300 MG tablet Commonly known as:  ZYLOPRIM Take 1 tablet (300 mg total) by mouth daily.   amLODipine 10 MG tablet Commonly known as:  NORVASC TAKE 1 TABLET BY MOUTH EVERY DAY   aspirin EC 81 MG tablet Take 81 mg by mouth daily.   BRILINTA 90 MG Tabs tablet Generic drug:  ticagrelor TAKE 1 TABLET BY MOUTH TWICE A DAY   [employer] VIT D 5000 HIGH-POTENCY 5000 units capsule Generic drug:  Cholecalciferol Take 5,000 Units by mouth daily.   hydrALAZINE 100 MG tablet Commonly known as:  APRESOLINE Take 1 tablet (100 mg total) by mouth 2 (two) times daily.   insulin NPH Human 100 UNIT/ML injection Commonly known as:  HUMULIN N,NOVOLIN N Use 20 units in morning and 22 units at bedtime. What changed:  how much to take  how to take this  when to take this  additional instructions   isosorbide mononitrate 120 MG 24 hr tablet Commonly known as:  IMDUR Take 1 tablet (120 mg total) by mouth daily.   metoprolol succinate 100 MG 24 hr tablet Commonly known as:  TOPROL XL Take 1 & 1/2  Tablets daily   nitroGLYCERIN 0.4 MG SL tablet Commonly known as:  NITROSTAT Place 1 tablet (0.4 mg total) under the tongue every 5 (five) minutes as needed for chest pain.   pantoprazole 40 MG tablet Commonly known  as:  PROTONIX Take 1 tablet (40 mg total) by mouth daily.   rosuvastatin 40 MG tablet Commonly known as:  CRESTOR Take 1 tablet (40 mg total) by mouth daily.      Follow-up Information    [name], MD Follow up.   Specialty:  Family Medicine Contact information: [place] [address] [phone]        Theodis Blaze, MD Follow up.   Specialty:  Internal Medicine Why:  call my cell phone with any questions [phone] Contact information: [address] [place] [address] [phone]            The results of significant diagnostics from this hospitalization (including imaging, microbiology, ancillary and laboratory) are listed below for reference.    Microbiology: No  results found for this or any previous visit (from the past 240 hour(s)).   Labs: Basic Metabolic Panel:  Recent Labs Lab 05/19/17 1551 05/20/17 0417 05/21/17 0337 05/21/17 1744 05/22/17 0346 05/23/17 0416  NA 131* 133* 136  --  136 138  K 3.2* 3.1* 3.1*  --  3.2* 3.6  CL 95* 99* 103  --  104 108  CO2 $Re'23 23 23  'wxc$ --  23 23  GLUCOSE 474* 410* 293* 366* 252* 241*  BUN 70* 61* 51*  --  51* 48*  CREATININE 5.61* 4.88* 4.50*  --  4.16* 3.70*  CALCIUM 8.7* 9.0 8.7*  --  8.6* 8.9   Liver Function Tests:  Recent Labs Lab 05/19/17 1551  AST 19  ALT 23  ALKPHOS 115  BILITOT 0.6  PROT 7.4  ALBUMIN 3.5   CBC:  Recent Labs Lab 05/19/17 1551 05/20/17 0417 05/21/17 0337 05/22/17 0346 05/23/17 0416  WBC 8.6 8.4 7.2 7.4 6.7  NEUTROABS 6.2  --   --   --   --   HGB 13.6 13.9 13.0 13.1 13.2  HCT 38.8* 40.8 39.2 39.1 39.4  MCV 80.7 81.6 81.8 82.5 81.6  PLT 183 190 189 187 194   Cardiac Enzymes:  Recent Labs Lab 05/19/17 1551  CKTOTAL 601*  TROPONINI <0.03   BNP (last 3 results)  Recent Labs  10/11/16 1232  BNP 224.5*   CBG:  Recent Labs Lab 05/22/17 0743 05/22/17 1201 05/22/17 1736 05/22/17 2026 05/23/17 0722   GLUCAP 230* 312* 233* 199* 223*   SIGNED: Time coordinating discharge: 30 minutes  Faye Ramsay, MD  [facility] 05/23/2017, 11:35 AM Pager [phone]  If 7PM-7AM, please contact night-coverage [email or web address] Password [email or web address]

## 2017-05-23 NOTE — Progress Notes (Signed)
Discharge instructions explained to pt and wife. Note given to wife for pt's work. Discharged via wheelchair. Pt ambulated around unit and tolerated well. Pt denies any questions.

## 2017-05-25 ENCOUNTER — Ambulatory Visit: Payer: Self-pay | Admitting: Physical Medicine & Rehabilitation

## 2017-05-26 ENCOUNTER — Encounter
Payer: BLUE CROSS/BLUE SHIELD | Attending: Physical Medicine & Rehabilitation | Admitting: Physical Medicine & Rehabilitation

## 2017-05-26 ENCOUNTER — Encounter: Payer: Self-pay | Admitting: Physical Medicine & Rehabilitation

## 2017-05-26 VITALS — BP 121/82 | HR 78

## 2017-05-26 DIAGNOSIS — K219 Gastro-esophageal reflux disease without esophagitis: Secondary | ICD-10-CM | POA: Diagnosis not present

## 2017-05-26 DIAGNOSIS — I252 Old myocardial infarction: Secondary | ICD-10-CM | POA: Insufficient documentation

## 2017-05-26 DIAGNOSIS — M109 Gout, unspecified: Secondary | ICD-10-CM | POA: Diagnosis not present

## 2017-05-26 DIAGNOSIS — E1122 Type 2 diabetes mellitus with diabetic chronic kidney disease: Secondary | ICD-10-CM | POA: Insufficient documentation

## 2017-05-26 DIAGNOSIS — Z833 Family history of diabetes mellitus: Secondary | ICD-10-CM | POA: Insufficient documentation

## 2017-05-26 DIAGNOSIS — Z955 Presence of coronary angioplasty implant and graft: Secondary | ICD-10-CM | POA: Insufficient documentation

## 2017-05-26 DIAGNOSIS — I5042 Chronic combined systolic (congestive) and diastolic (congestive) heart failure: Secondary | ICD-10-CM | POA: Insufficient documentation

## 2017-05-26 DIAGNOSIS — Z89431 Acquired absence of right foot: Secondary | ICD-10-CM | POA: Diagnosis not present

## 2017-05-26 DIAGNOSIS — R269 Unspecified abnormalities of gait and mobility: Secondary | ICD-10-CM | POA: Insufficient documentation

## 2017-05-26 DIAGNOSIS — Z89421 Acquired absence of other right toe(s): Secondary | ICD-10-CM | POA: Diagnosis not present

## 2017-05-26 DIAGNOSIS — F419 Anxiety disorder, unspecified: Secondary | ICD-10-CM | POA: Diagnosis not present

## 2017-05-26 DIAGNOSIS — R634 Abnormal weight loss: Secondary | ICD-10-CM | POA: Diagnosis not present

## 2017-05-26 DIAGNOSIS — I2510 Atherosclerotic heart disease of native coronary artery without angina pectoris: Secondary | ICD-10-CM | POA: Insufficient documentation

## 2017-05-26 DIAGNOSIS — Z8249 Family history of ischemic heart disease and other diseases of the circulatory system: Secondary | ICD-10-CM | POA: Diagnosis not present

## 2017-05-26 DIAGNOSIS — F329 Major depressive disorder, single episode, unspecified: Secondary | ICD-10-CM | POA: Diagnosis not present

## 2017-05-26 DIAGNOSIS — Z89439 Acquired absence of unspecified foot: Secondary | ICD-10-CM | POA: Insufficient documentation

## 2017-05-26 DIAGNOSIS — I130 Hypertensive heart and chronic kidney disease with heart failure and stage 1 through stage 4 chronic kidney disease, or unspecified chronic kidney disease: Secondary | ICD-10-CM | POA: Insufficient documentation

## 2017-05-26 DIAGNOSIS — N183 Chronic kidney disease, stage 3 (moderate): Secondary | ICD-10-CM | POA: Diagnosis not present

## 2017-05-26 DIAGNOSIS — Z9889 Other specified postprocedural states: Secondary | ICD-10-CM | POA: Diagnosis not present

## 2017-05-26 DIAGNOSIS — N179 Acute kidney failure, unspecified: Secondary | ICD-10-CM | POA: Diagnosis not present

## 2017-05-26 DIAGNOSIS — I255 Ischemic cardiomyopathy: Secondary | ICD-10-CM | POA: Diagnosis not present

## 2017-05-26 DIAGNOSIS — E1143 Type 2 diabetes mellitus with diabetic autonomic (poly)neuropathy: Secondary | ICD-10-CM | POA: Diagnosis not present

## 2017-05-26 DIAGNOSIS — G4733 Obstructive sleep apnea (adult) (pediatric): Secondary | ICD-10-CM | POA: Insufficient documentation

## 2017-05-26 DIAGNOSIS — E785 Hyperlipidemia, unspecified: Secondary | ICD-10-CM | POA: Insufficient documentation

## 2017-05-26 DIAGNOSIS — E1142 Type 2 diabetes mellitus with diabetic polyneuropathy: Secondary | ICD-10-CM | POA: Insufficient documentation

## 2017-05-26 NOTE — Progress Notes (Signed)
Subjective:    Patient ID: Joshua [name], male    DOB: 11-01-1956, 61 y.o.   MRN: [id number]  HPI  61 year old right-handed male with history of CAD, diabetes mellitus, chronic renal insufficiency, morbid obesity presents for follow up for right TMA.    Last clinic visit 11/24/16.  Since last visit, he was recently in the hospital for AKI due to dehydration/new medications. Outside of that he has been doing well.  He started a new job.  He joined the gym and is doing exercises. He stopped try to obtain the prosthesis due to difficulty obtaining.  Denies falls. Denies pain.  He lost weight during his hospital stay.   Pain Inventory Average Pain 5 Pain Right Now 4 My pain is aching  In the last 24 hours, has pain interfered with the following? General activity 1 Relation with others 0 Enjoyment of life 1 What TIME of day is your pain at its worst? varies Sleep (in general) Fair  Pain is worse with: lack of medication Pain improves with: rest and medication Relief from Meds: 7  Mobility walk with assistance use a walker how many minutes can you walk? 3 ability to climb steps?  yes do you drive?  no use a wheelchair Do you have any goals in this area?  yes  Function not employed: date last employed 03/2016 Do you have any goals in this area?  yes  Neuro/Psych trouble walking depression anxiety  Prior Studies x-rays CT/MRI hospital f/u  Physicians involved in your care hospital f/u   Family History  Problem Relation Age of Onset  . Heart attack Maternal Aunt   . Diabetes Maternal Grandmother   . Hypertension Maternal Grandmother    Social History   Social History  . Marital status: Married    Spouse name: N/A  . Number of children: N/A  . Years of education: N/A   Social History Main Topics  . Smoking status: Never Smoker  . Smokeless tobacco: Never Used  . Alcohol use No     Comment: none since 07/2016  . Drug use: No  . Sexual activity: Yes    Other Topics Concern  . None   Social History Narrative  . None   Past Surgical History:  Procedure Laterality Date  . AMPUTATION Right 07/24/2016   Procedure: TRANSMETATARSAL FOOT AMPUTATION;  Surgeon: [name], MD;  Location: [facility];  Service: Orthopedics;  Laterality: Right;  . BREAST LUMPECTOMY Right ~ 2000   "benign"  . CARDIAC CATHETERIZATION N/A 07/30/2015   Procedure: Left Heart Cath and Coronary Angiography;  Surgeon: Burnell Blanks, MD;  Location: [facility];  Service: Cardiovascular;  Laterality: N/A;  . CARDIAC CATHETERIZATION N/A 07/19/2016   Procedure: Left Heart Cath and Coronary Angiography;  Surgeon: Belva Crome, MD;  Location: [facility];  Service: Cardiovascular;  Laterality: N/A;  . CARDIAC CATHETERIZATION N/A 07/29/2016   Procedure: Coronary Stent Intervention;  Surgeon: Belva Crome, MD;  Location: [facility];  Service: Cardiovascular;  Laterality: N/A;  . CARDIAC CATHETERIZATION N/A 07/29/2016   Procedure: Intravascular Ultrasound/IVUS;  Surgeon: Belva Crome, MD;  Location: [facility];  Service: Cardiovascular;  Laterality: N/A;  . CATARACT EXTRACTION W/ INTRAOCULAR LENS  IMPLANT, BILATERAL Bilateral 2014-2015  . CORONARY ANGIOPLASTY WITH STENT PLACEMENT  10/31/2011   30 % MID ATRIOVENTRICULAR GROOVE CX STENOSIS. 90 % MID RCA.PTA TO LAD/STENTING OF TANDEM 60-70%. LAD STENOSIS 99% REDUCED TO 0%.3.0 X  32MM PROMUS DES POSTDILATED TO 3.25MM.  [name] CORONARY ANGIOPLASTY WITH STENT PLACEMENT  07/03/2014   "2"  . I&D EXTREMITY Right 10/30/2013   Procedure: IRRIGATION AND DEBRIDEMENT RIGHT SMALL FINGER POSSIBLE SECONDARY WOUND CLOSURE;  Surgeon: [name], MD;  Location: [facility];  Service: Orthopedics;  Laterality: Right;  [name] is available after 4pm  . I&D EXTREMITY Right 11/01/2013   Procedure:  IRRIGATION AND DEBRIDEMENT RIGHT  PROXIMAL PHALANGEAL LEVEL AMPUTATION;  Surgeon: [name], MD;  Location: [facility];   Service: Orthopedics;  Laterality: Right;  . INCISION AND DRAINAGE Right 10/28/2013   Procedure: INCISION AND DRAINAGE Right small finger;  Surgeon: [name], MD;  Location: [facility];  Service: Orthopedics;  Laterality: Right;  . LEFT HEART CATH Left 11/03/2011   Procedure: LEFT HEART CATH;  Surgeon: [name], MD;  Location: [facility];  Service: Cardiovascular;  Laterality: Left;  . LEFT HEART CATHETERIZATION WITH CORONARY ANGIOGRAM N/A 07/03/2014   Procedure: LEFT HEART CATHETERIZATION WITH CORONARY ANGIOGRAM;  Surgeon: [name], MD;  Location[facility];  Service: Cardiovascular;  Laterality: N/A;  . PERCUTANEOUS CORONARY STENT INTERVENTION (PCI-S) Left 11/03/2011   Procedure: PERCUTANEOUS CORONARY STENT INTERVENTION (PCI-S);  Surgeon: [name], MD;  Location: [facility];  Service: Cardiovascular;  Laterality: Left;  . TOE AMPUTATION Right ~ 2010   "2nd toe"  . TOE AMPUTATION Right 2012   "3rd toe"   Past Medical History:  Diagnosis Date  . Chronic combined systolic and diastolic CHF (congestive heart failure) ([place])    a. 07/2015 Echo: EF 45-50%, mod LVH, mid apical/antsept AK, Gr 1 DD.  [name] CKD (chronic kidney disease), stage III   . Coronary artery disease    a. 2012 s/p MI/PCI: s/p DES to mid/dist RCA and overlapping DES to LAD;  b. 07/2015 Cath: LAD 10% ISR, D1 40(jailed), LCX 17m, OM2 30, OM3 30, RCA 15m ISR, 10d ISR; c. 07/2016 NSTEMI/PCI: LAD 85ost/p/m (3.5x20 Synergy DES overlapping prior stent), D1 40, LCX 78m, OM2 95 (2.75x16 Synergy DES), OM3 30, RCA 2m, 10d.  . Depression   . Diabetic gastroparesis ([place])   . GERD (gastroesophageal reflux disease)   . Gout   . Gout   . Heart murmur   . Hyperlipemia   . Hypertensive heart disease   . Ischemic cardiomyopathy    a. 07/2015 Echo: EF 45-50%, mod LVH, mid-apicalanteroseptal AK, Gr1 DD.  . Morbid obesity ([place])   . Myocardial infarction ([place]) 2012  . OSA on CPAP   . Osteomyelitis ([place])    a. 07/2016  MTP joint of R great toe s/p transmetatarsal amputation.  . Polyneuropathy in diabetes(357.2)   . Pulmonary sarcoidosis ([place])    "problems w/it years ago; no problems anymore" (07/03/2014)  . Type II diabetes mellitus (HCC)    BP 121/82   Pulse 78   SpO2 97%   Opioid Risk Score:   Fall Risk Score:  `1  Depression screen PHQ 2/9  Depression screen [facility] 2/9 11/01/2016 08/26/2016 01/31/2014 12/20/2013 11/27/2013  Decreased Interest 0 1 0 0 0  Down, Depressed, Hopeless 0 2 0 0 0  PHQ - 2 Score 0 3 0 0 0  Altered sleeping - 1 - - -  Tired, decreased energy - 1 - - -  Change in appetite - 1 - - -  Feeling bad or failure about yourself  - 2 - - -  Trouble concentrating - 1 - - -  Moving slowly or fidgety/restless - 0 - - -  Suicidal thoughts - 1 - - -  PHQ-9 Score - 10 - - -  Difficult doing work/chores - Somewhat difficult - - -  Some recent data might be hidden    Review of Systems  HENT: Negative.   Eyes: Negative.   Respiratory: Positive for apnea.   Cardiovascular: Negative.   Endocrine: Negative.        High blood sugar  Genitourinary: Negative.   Musculoskeletal: Positive for gait problem.  Allergic/Immunologic: Negative.   Psychiatric/Behavioral: Positive for dysphoric mood. The patient is nervous/anxious.   All other systems reviewed and are negative.      Objective:   Physical Exam  Constitutional:  He appears well-developed. Obese. NAD. Vital signs reviewed.  HENT: Normocephalic and atraumatic.  Eyes: EOMI. No discharge.  Cardiovascular: RRR. No JVD. Respiratory: Effort normal and breath sounds normal.  GI: Soft. Bowel sounds are normal.   Musculoskeletal:  Right TMA He exhibits no tenderness, no edema. Neurological: He is alert and oriented Follows all commands.  UE 5/5. RLE: 5/5HF, ADF/PF 5/5.   LLE: 5/5 prox to distal.  Skin: Skin is warm and dry. TMA healed.  Psychiatric: He has a normal mood and affect. His behavior is normal.     Assessment & Plan:    61 year old right-handed male with history of CAD, diabetes mellitus, chronic renal insufficiency, morbid obesity presents for follow up for right TMA.    1.  Right transmetatarsal amputation secondary to osteomyelitis   Cont HEP  Pt states insurance denies prosthesis, adapted without it  2. Morbid obesity             Cont encourage weight loss  Not interested in seeing dietitian  3. Gait abnormality  Due to lack of forefoot

## 2017-06-01 ENCOUNTER — Encounter: Payer: BLUE CROSS/BLUE SHIELD | Admitting: Physical Medicine & Rehabilitation

## 2017-06-10 ENCOUNTER — Other Ambulatory Visit: Payer: Self-pay | Admitting: Gastroenterology

## 2017-06-17 ENCOUNTER — Telehealth: Payer: Self-pay | Admitting: *Deleted

## 2017-06-17 NOTE — Telephone Encounter (Signed)
Requesting surgical clearance:   1. Type of surgery: colonoscopy/endoscopy  2. Surgeon: Dr [name]  3. Surgical date: 08/22/17  4. Medications that need to be held: Brilinta                         Special instructions:

## 2017-06-21 ENCOUNTER — Other Ambulatory Visit: Payer: Self-pay | Admitting: Nurse Practitioner

## 2017-06-21 NOTE — Telephone Encounter (Signed)
Okay to hold Brilinta for  5 days prior to procedure

## 2017-06-23 NOTE — Telephone Encounter (Signed)
Approval sent to Sr Schooler via EPIC.

## 2017-07-29 ENCOUNTER — Ambulatory Visit (INDEPENDENT_AMBULATORY_CARE_PROVIDER_SITE_OTHER): Payer: BLUE CROSS/BLUE SHIELD | Admitting: Cardiovascular Disease

## 2017-07-29 VITALS — BP 178/92 | HR 71 | Ht 69.0 in | Wt 304.3 lb

## 2017-07-29 DIAGNOSIS — Z9861 Coronary angioplasty status: Secondary | ICD-10-CM | POA: Diagnosis not present

## 2017-07-29 DIAGNOSIS — I110 Hypertensive heart disease with heart failure: Secondary | ICD-10-CM | POA: Diagnosis not present

## 2017-07-29 DIAGNOSIS — G4733 Obstructive sleep apnea (adult) (pediatric): Secondary | ICD-10-CM | POA: Diagnosis not present

## 2017-07-29 DIAGNOSIS — I2510 Atherosclerotic heart disease of native coronary artery without angina pectoris: Secondary | ICD-10-CM | POA: Diagnosis not present

## 2017-07-29 DIAGNOSIS — I10 Essential (primary) hypertension: Secondary | ICD-10-CM

## 2017-07-29 DIAGNOSIS — E785 Hyperlipidemia, unspecified: Secondary | ICD-10-CM

## 2017-07-29 MED ORDER — ISOSORBIDE MONONITRATE ER 120 MG PO TB24: 120.0000 mg | ORAL_TABLET | Freq: Every day | ORAL | 2 refills | Status: DC

## 2017-07-29 MED ORDER — METOPROLOL SUCCINATE ER 100 MG PO TB24: ORAL_TABLET | ORAL | 2 refills | Status: DC

## 2017-07-29 NOTE — Patient Instructions (Signed)
Medication Instructions:  Your physician recommends that you continue on your current medications as directed. Please refer to the Current Medication list given to you today.  Follow-Up: Your physician wants you to follow-up in: 4 MONTHS with Dr. Claiborne Billings. You will receive a reminder letter in the mail two months in advance. If you don't receive a letter, please call our office to schedule the follow-up appointment.   Any Other Special Instructions Will Be Listed Below (If Applicable).     If you need a refill on your cardiac medications before your next appointment, please call your pharmacy.

## 2017-07-29 NOTE — Progress Notes (Signed)
Patient ID: Joshua [name], male   DOB: [birth year], 61 y.o.   MRN: [id number]      HPI: Joshua [name] is a 61 y.o. male who presents to the office for a 4 month  follow-up cardiology evaluation.  [name] suffered an acute coronary syndrome on 10/31/2011 and presented with ECG findings of anterolateral ST segment elevation. Cardiac catheterization revealed a 99% mid LAD stenosis and proximal to this there was a 60-70% stenosis after the first diagonal vessel. He also had 90-95% mid RCA stenosis and 30% circumflex stenosis. He underwent initial acute intervention to his LAD and a 3.0x32 mm Promus DES stent was inserted. 3 days later he underwent successful staged PCI to his 95% RCA stenosis and a 3.0x18 mm Resolute DES stent was inserted and post dilated 3.5 mm.   Additional problems include at least a >30 year history of hypertension, a 25 year history of diabetes mellitus, obesity, as well as hyperlipidemia.  He was admitted to the hospital on 07/29/2015 with complaints of shortness of breath similar to his initial symptomatology.  He had mild renal insufficiency, was hydrated and underwent cardiac catheterization by Dr. Julianne [name] in 07/30/2015.  His LAD stents were patent.  There was mild 40% first agonal stenosis, 60% circumflex stenosis, and his RCA stents had a 50% intimal narrowing in its mid stent without significant narrowing in the distal stent.  An echo Doppler study on 07/31/2015 showed an EF of 45-50% with moderate left ventricular hypertrophy and grade 1 diastolic dysfunction.   He was seen by Joshua [name] on 08/08/2015 for initial posthospital evaluation.  Since that time, he has had some vague symptoms of GERD.  He is not on any proton pump inhibitor.  He gets shortness of breath with minimal activity including brushing his teeth and taking a shower.  He has not been routinely exercising.  He has obstructive sleep apnea and uses CPAP therapy.  He denies residual snoring.  He works at  the [employer] in the night shift.  At times he feels that some of his food may get stuck when swallowing but this is not consistent.    When I saw him in September 2016, I started him on Protonix for significant GERD symptoms which has improved.  We discussed weight loss and exercise. Laboratory showed worsening renal function with a creatinine of 1.84, significant glucose elevation at 669 with probable factitious hyponatremia at 129, due to high glucose levels.  His hemoglobin A1c was 14.2.  I referred him to Dr. Michiel [name] for endocrinologic evaluation. I also discontinued his spironolactone and added hydralazine 25 mg twice a day for more optimal blood pressure control in light of his renal insufficiency.  We discussed the importance of weight loss.  When I saw him in the office in March 2017, he has been in a cast and boot in his right leg due to prior infection of his right toe. He did undergo hyperbaric chamber therapy.  He tells me he was initially told of having borderline osteomyelitis. Four weeks ago his cultures were negative.  He subsequently required right metatarsal amputation for osteomyelitis in August 2017.  In August he was admitted to [facility] with unstable angina and ruled in for a non-STEM>  Repeat catheterization showed new eccentric proximal LAD stenosis, not involving the previously placed overlapping mid LAD stents, eccentric 75% mid left circumflex stenosis between OM1 and OM 2, and progression of OM1 stenosis to 95%.  He ultimately underwent stenting  to his LAD and circumflex marginal vessel with Synergy stents.  He was readmitted in October 2017 with recurrent chest pain which was felt to be atypical and musculoskeletal.  A 2 day protocol nuclear stress test showed an EF of 39% without ischemia.  He was seen in office follow-up by Joshua [name], Joshua [name] on 10/22/2016.   When I saw him in December 2017, he denied any recurrent episodes of chest pain.  His sleep was poor and  he felt tired.  Over 10 years ago he was evaluated for sleep apnea but had only used CPAP for 6 months.  I referred him for a sleep study which was done on 01/07/2017.  Revealed severe obstructive sleep apnea with an AHI of 41.6/h.  He had severe oxygen desaturation to a nadir of 77%.  He had increased periodic limb movements with an index of 25.15.  On the diagnostic portion of the study there was absence of REM sleep. During the  CPAP titration there was evidence for REM rebound, such that he slept 32.6% in REM sleep.    When I last saw him, he was on CPAP for 6 weeks and felt significantly improved. I obtained a download of his CPAP unit today from 03/07/2017 through 04/05/2017.  He is 100% compliant with usage stays and usage greater than 4 hours and is averaging 8 hours and 4 minutes of sleep per night.  He is set at a 17 cm pressure his AHI is excellent at 1.5.  He does have a moderate leak.  He uses a fullface mask.  He denies any chest pain.  He denies palpitations.  He had noticed recently that his blood pressure is elevated.    Since I last saw him, he was able to get a new job.  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  His work is from 7 PM until 7 AM.  Typically he would go home and get to sleep between 8:39 AM.  Depending upon if he had to pick his son up, he would either sleep until 5 PM or if he had to get his son up would have to wake up in the early afternoon.  He denies exertionally precipitated chest pain but has noticed some sharp fleeting atypical sensations.  There was recent confusion with his medications and he had run out of his metoprolol succinate which he was taking 150 mg daily and has not taken for 2 days.  Over the past week he ran out of isosorbide.  He presents for evaluation.  A new download was obtained in the office today from July 15 through 07/25/2017.  He did have machine malfunction contribute to reduce compliance.  However, at a 17 cm pressure.  AHI was  excellent at 0.4.  An Epworth Sleepiness Scale score was recalculated in the office today and this endorsed at 7.  Past Medical History:  Diagnosis Date  . Chronic combined systolic and diastolic CHF (congestive heart failure) ([place])    a. 07/2015 Echo: EF 45-50%, mod LVH, mid apical/antsept AK, Gr 1 DD.  [name] CKD (chronic kidney disease), stage III   . Coronary artery disease    a. 2012 s/p MI/PCI: s/p DES to mid/dist RCA and overlapping DES to LAD;  b. 07/2015 Cath: LAD 10% ISR, D1 40(jailed), LCX 39m OM2 30, OM3 30, RCA 549mSR, 10d ISR; c. 07/2016 NSTEMI/PCI: LAD 85ost/p/m (3.5x20 Synergy DES overlapping prior stent), D1 40, LCX 7524mM2 95 (2.75x16 Synergy DES), OM3  30, RCA 29m 10d.  . Depression   . Diabetic gastroparesis ([place]   . GERD (gastroesophageal reflux disease)   . Gout   . Gout   . Heart murmur   . Hyperlipemia   . Hypertensive heart disease   . Ischemic cardiomyopathy    a. 07/2015 Echo: EF 45-50%, mod LVH, mid-apicalanteroseptal AK, Gr1 DD.  . Morbid obesity ([place]   . Myocardial infarction ([place] 2012  . OSA on CPAP   . Osteomyelitis ([place]    a. 07/2016 MTP joint of R great toe s/p transmetatarsal amputation.  . Polyneuropathy in diabetes(357.2)   . Pulmonary sarcoidosis ([place]    "problems w/it years ago; no problems anymore" (07/03/2014)  . Type II diabetes mellitus ([place]     Past Surgical History:  Procedure Laterality Date  . AMPUTATION Right 07/24/2016   Procedure: TRANSMETATARSAL FOOT AMPUTATION;  Surgeon: MNewt Minion MD;  [facility]: [facility]  Service: Orthopedics;  Laterality: Right;  . BREAST LUMPECTOMY Right ~ 2000   "benign"  . CARDIAC CATHETERIZATION N/A 07/30/2015   Procedure: Left Heart Cath and Coronary Angiography;  Surgeon: CBurnell Blanks MD;  [facility];  Service: Cardiovascular;  Laterality: N/A;  . CARDIAC CATHETERIZATION N/A 07/19/2016   Procedure: Left Heart Cath and Coronary Angiography;  Surgeon: HBelva Crome MD;   [facility];  Service: Cardiovascular;  Laterality: N/A;  . CARDIAC CATHETERIZATION N/A 07/29/2016   Procedure: Coronary Stent Intervention;  Surgeon: HBelva Crome MD;  [place];  Service: Cardiovascular;  Laterality: N/A;  . CARDIAC CATHETERIZATION N/A 07/29/2016   Procedure: Intravascular Ultrasound/IVUS;  Surgeon: HBelva Crome MD;  [facility];  Service: Cardiovascular;  Laterality: N/A;  . CATARACT EXTRACTION W/ INTRAOCULAR LENS  IMPLANT, BILATERAL Bilateral 2014-2015  . CORONARY ANGIOPLASTY WITH STENT PLACEMENT  10/31/2011   30 % MID ATRIOVENTRICULAR GROOVE CX STENOSIS. 90 % MID RCA.PTA TO LAD/STENTING OF TANDEM 60-70%. LAD STENOSIS 99% REDUCED TO 0%.3.0 X 32MM PROMUS DES POSTDILATED TO 3.25MM.  .[name] KitchenCORONARY ANGIOPLASTY WITH STENT PLACEMENT  07/03/2014   "2"  . I&D EXTREMITY Right 10/30/2013   Procedure: IRRIGATION AND DEBRIDEMENT RIGHT SMALL FINGER POSSIBLE SECONDARY WOUND CLOSURE;  Surgeon: MSchuyler Amor MD;  [facility];  Service: Orthopedics;  Laterality: Right;  WBurney Gauzeis available after 4pm  . I&D EXTREMITY Right 11/01/2013   Procedure:  IRRIGATION AND DEBRIDEMENT RIGHT  PROXIMAL PHALANGEAL LEVEL AMPUTATION;  Surgeon: MSchuyler Amor MD;  [facility]: [facility];  Service: Orthopedics;  Laterality: Right;  . INCISION AND DRAINAGE Right 10/28/2013   Procedure: INCISION AND DRAINAGE Right small finger;  Surgeon: MSchuyler Amor MD;  Location: [facility];  Service: Orthopedics;  Laterality: Right;  . LEFT HEART CATH Left 11/03/2011   Procedure: LEFT HEART CATH;  Surgeon: TTroy Sine MD;  [facility];  Service: Cardiovascular;  Laterality: Left;  . LEFT HEART CATHETERIZATION WITH CORONARY ANGIOGRAM N/A 07/03/2014   Procedure: LEFT HEART CATHETERIZATION WITH CORONARY ANGIOGRAM;  Surgeon: HSinclair Grooms MD;  [facility];  Service: Cardiovascular;  Laterality: N/A;  . PERCUTANEOUS CORONARY STENT  INTERVENTION (PCI-S) Left 11/03/2011   Procedure: PERCUTANEOUS CORONARY STENT INTERVENTION (PCI-S);  Surgeon: TTroy Sine MD;  [facility];  Service: Cardiovascular;  Laterality: Left;  . TOE AMPUTATION Right ~ 2010   "2nd toe"  . TOE AMPUTATION Right 2012   "3rd toe"    Allergies  Allergen Reactions  . Chlorthalidone     Current Outpatient Prescriptions  Medication Sig Dispense Refill  . acetaminophen (TYLENOL) 325 MG tablet Take 2 tablets (650 mg total) by mouth every 4 (four) hours as needed for headache or mild pain.    [name] allopurinol (ZYLOPRIM) 300 MG tablet Take 1 tablet (300 mg total) by mouth daily. 30 tablet 0  . amLODipine (NORVASC) 10 MG tablet TAKE 1 TABLET BY MOUTH EVERY DAY 30 tablet 2  . aspirin EC 81 MG tablet Take 81 mg by mouth daily.    [name] BRILINTA 90 MG TABS tablet TAKE 1 TABLET BY MOUTH TWICE A DAY 60 tablet 11  . hydrALAZINE (APRESOLINE) 100 MG tablet Take 1 tablet (100 mg total) by mouth 2 (two) times daily. 60 tablet 6  . insulin NPH Human (HUMULIN N,NOVOLIN N) 100 UNIT/ML injection Inject 30 Units into the skin. Every morning and every evening    . insulin regular (NOVOLIN R,HUMULIN R) 100 units/mL injection Inject into the skin. Sliding scale---takes before he eats    . isosorbide mononitrate (IMDUR) 120 MG 24 hr tablet Take 1 tablet (120 mg total) by mouth daily. 90 tablet 2  . metoprolol succinate (TOPROL XL) 100 MG 24 hr tablet Take 1 & 1/2  Tablets daily 135 tablet 2  . nitroGLYCERIN (NITROSTAT) 0.4 MG SL tablet Place 1 tablet (0.4 mg total) under the tongue every 5 (five) minutes as needed for chest pain. 25 tablet 3  . pantoprazole (PROTONIX) 40 MG tablet Take 1 tablet (40 mg total) by mouth daily. 30 tablet 0  . rosuvastatin (CRESTOR) 40 MG tablet Take 1 tablet (40 mg total) by mouth daily. 30 tablet 11   No current facility-administered medications for this visit.     Social History   Social History  . Marital status: Married    Spouse  name: N/A  . Number of children: N/A  . Years of education: N/A   Occupational History  . Not on file.   Social History Main Topics  . Smoking status: Never Smoker  . Smokeless tobacco: Never Used  . Alcohol use No     Comment: none since 07/2016  . Drug use: No  . Sexual activity: Yes   Other Topics Concern  . Not on file   Social History Narrative  . No narrative on file    Family History  Problem Relation Age of Onset  . Heart attack Maternal Aunt   . Diabetes Maternal Grandmother   . Hypertension Maternal Grandmother     ROS General: Negative; No fevers, chills, or night sweats;  HEENT: Negative; No changes in vision or hearing, sinus congestion, difficulty swallowing Pulmonary: Negative; No cough, wheezing, shortness of breath, hemoptysis Cardiovascular: Negative; No chest pain, presyncope, syncope, palpitations GI: Negative; No nausea, vomiting, diarrhea, or abdominal pain GU: Negative; No dysuria, hematuria, or difficulty voiding Musculoskeletal: Negative; no myalgias, joint pain, or weakness Hematologic/Oncology: Negative; no easy bruising, bleeding Endocrine: Negative; no heat/cold intolerance; no diabetes Neuro: Negative; no changes in balance, headaches Skin: Negative; No rashes or skin lesions Psychiatric: Negative; No behavioral problems, depression Sleep: Positive for obstructive sleep apnea not on CPAP therapy;  snoring, daytime sleepiness, hypersomnolence, no bruxism, restless legs, hypnogognic hallucinations, no cataplexy Other comprehensive 14 point system review is negative.   PE BP (!) 178/92 (BP Location: Left Arm, Patient Position: Sitting, Cuff Size: Large)   Pulse 71   Ht _0  (1.753 m)   Wt (!) 304 lb 4.8  oz (138 kg)   BMI 44.94 kg/m    Repeat blood pressure by me was 162/90  Wt Readings from Last 3 Encounters:  07/29/17 (!) 304 lb 4.8 oz (138 kg)  05/22/17 299 lb 11.2 oz (135.9 kg)  04/06/17 (!) 328 lb 6.4 oz (149 kg)   General:  Alert, oriented, no distress.  Skin: normal turgor, no rashes HEENT: Normocephalic, atraumatic. Pupils round and reactive; sclera anicteric;no lid lag.  Nose without nasal septal hypertrophy Mouth/Parynx benign; Mallinpatti scale 3/4 Neck: Very thick neck; No JVD, no carotid bruits with normal carotid upstroke Lungs: clear to ausculatation and percussion; no wheezing or rales Chest wall: Nontender to palpation Heart: RRR, s1 s2 normal 1/6 systolic murmur, unchanged.  No S3 gallop.  No rubs thrills or heaves Abdomen: Moderate central adiposity. soft, nontender; no hepatosplenomehaly, BS+; abdominal aorta nontender and not dilated by palpation. Back: No CVA tenderness Pulses 2+ Extremities:  Right metatarsal amputation; Homan's sign negative  .  Trace to 1+ edema, right ankle, trivial left ankle Neurologic: grossly nonfocal Psychologic: normal affect and mood.  ECG (independently read by me): Normal sinus rhythm at 75 bpm.  First-degree AV block with a PR interval of 202 ms.  No significant ST-T changes.  April 2018 ECG (independently read by me): Normal sinus rhythm at 78 bpm.  First degree AV block with a PR interval of 206 msec.  QS complex V1 through V4  02/17/2017 ECG (independently read by me): Normal sinus rhythm at 75 bpm.  No ectopy.  Q wave in lead 3.  December 2017 ECG (independently read by me): normal sinus rhythm at 68 bpm.  First-degree AV block with a PR interval 212 ms.  No significant ST changes.  March ECG (independently read by me):   Normal sinus rhythm at 80 bpm..  No ectopy.  Borderline left atrial enlargement.  November 2016 ECG (independently read by me):  Normal sinus rhythm at 77 bpm.  Poor R wave progression anteriorly.  QTc interval 425 ms. Borderline first-degree AV block.  ECG (independently read by me): Sinus rhythm at 85 bpm.  Early repolarization changes anterolaterally.  Prior 2014 ECG: Normal sinus rhythm at 90 beats per minute. Poor progression V1  through V4. Normal intervals.  LABS: BMP Latest Ref Rng & Units 05/23/2017 05/22/2017 05/21/2017  Glucose 65 - 99 mg/dL 241(H) 252(H) 366(H)  BUN 6 - 20 mg/dL 48(H) 51(H) -  Creatinine 0.61 - 1.24 mg/dL 3.70(H) 4.16(H) -  Sodium 135 - 145 mmol/L 138 136 -  Potassium 3.5 - 5.1 mmol/L 3.6 3.2(L) -  Chloride 101 - 111 mmol/L 108 104 -  CO2 22 - 32 mmol/L 23 23 -  Calcium 8.9 - 10.3 mg/dL 8.9 8.6(L) -   Hepatic Function Latest Ref Rng & Units 05/19/2017 11/17/2016 08/04/2016  Total Protein 6.5 - 8.1 g/dL 7.4 6.6 7.3  Albumin 3.5 - 5.0 g/dL 3.5 3.4(L) 2.3(L)  AST 15 - 41 U/L _0 ALT 17 - 63 U/L 23 16 16(L)  Alk Phosphatase 38 - 126 U/L 115 140(H) 114  Total Bilirubin 0.3 - 1.2 mg/dL 0.6 0.3 0.3   CBC Latest Ref Rng & Units 05/23/2017 05/22/2017 05/21/2017  WBC 4.0 - 10.5 K/uL 6.7 7.4 7.2  Hemoglobin 13.0 - 17.0 g/dL 13.2 13.1 13.0  Hematocrit 39.0 - 52.0 % 39.4 39.1 39.2  Platelets 150 - 400 K/uL 194 187 189   Lab Results  Component Value Date   MCV 81.6 05/23/2017   MCV  82.5 05/22/2017   MCV 81.8 05/21/2017   Lab Results  Component Value Date   TSH 2.15 11/17/2016   Lab Results  Component Value Date   HGBA1C 11.8 (H) 05/19/2017   Lipid Panel     Component Value Date/Time   CHOL 158 11/17/2016 0921   TRIG 116 11/17/2016 0921   HDL 74 11/17/2016 0921   CHOLHDL 2.1 11/17/2016 0921   VLDL 23 11/17/2016 0921   LDLCALC 61 11/17/2016 0921      RADIOLOGY: Dg Chest 2 View  10/28/2013   CLINICAL DATA:  Shortness of breath  EXAM: CHEST  2 VIEW  COMPARISON:  10/31/2011  FINDINGS: Mild cardiomegaly, stable from prior. Kerley A and B lines noted. No significant effusion. No asymmetric airspace opacity. No acute osseous findings.  IMPRESSION: Mild interstitial pulmonary edema.   Electronically Signed   By: Jorje Guild M.D.   On: 10/28/2013 23:01   Dg Hand Complete Right  10/28/2013   CLINICAL DATA:  Little finger laceration with pain  EXAM: RIGHT HAND - COMPLETE 3+ VIEW   COMPARISON:  None.  FINDINGS: There is extensive soft tissue swelling of the 5th digit, extending into the dorsal hand. No radiodense foreign body. No osseous erosion or asymmetric joint narrowing to suggest osseous/joint infection. No acute fracture or malalignment.  Remote ulnar and radial styloid process fractures.  Degenerative interphalangeal joint narrowing throughout the digits.  IMPRESSION: No evidence of osseous infection.  No radiodense foreign body.   Electronically Signed   By: Jorje Guild M.D.   On: 10/28/2013 23:04    IMPRESSION:  1. CAD S/P multiple PCIs   2. Essential hypertension   3. Obstructive sleep apnea syndrome   4. Morbid obesity ([place])   5. Hypertensive heart disease with heart failure ([place])   6. Hyperlipidemia with target LDL less than 70     ASSESSMENT AND PLAN: Mr. Houa Ackert is a 61 year old African-American male with morbid obesity who suffered an acute coronary syndrome/ anterolateral ST segment elevation myocardial infarction in November 2012. At that time he underwent stenting to his subtotaled LAD stenosis and 3 days later underwent staged intervention to a high grade mid right coronary artery stenosis. A nuclear perfusion study in January 2013 was low risk and demonstrated asmall area of anteroseptal scar towards the apex without associated ischemia.  Due to osteomyelitis he underwent right metatarsal amputation in August 2017.  Subsequently, he developed chest pain and ruled in with a NSTEMI.  Repeat catheterization demonstrated progressive proximal LAD disease and circumflex marginal disease and underwent two-vessel PCI.  Subsequently, he developed atypical chest pain which was described as more of a piercing sensation.  A nuclear study did not reveal new ischemia in October 2017.  He has had issues with continued hypertension leading to titration of his medical regimen.  When I last saw him I further titrated his Toprol-XL to 150 mg daily to take in addition  to his amlodipine 10 mg, hydralazine 100 mg twice a day, isosorbide 120 mg daily.  Apparently for the past week he ran out of isosorbide and for the past several days.  Ran out of Toprol.  His blood pressure today is elevated which may be attributed somewhat to some blood pressure rebound being off his medications.  I am resuming his Toprol at 150 mg daily and his isosorbide.  He will monitor his blood pressure to see if this comes down.  I reviewed his most recent download will be obtained in  the office.  His AHI is excellent at 17 cm but his compliance was very poor at only 37% of usage stays which he states was due to machine malfunction.  His DME company is [employer].  BMI is 44.94, consistent with morbid obesity.  Weight loss was recommended.  Sodium restriction was recommended.  He is now working the night shift and due to commitments at times to pick up his son he is not sleeping adequate duration.  I will see him in 4 months for reevaluation. Time spent: 25 minutes  [name], MD, [facility]  07/31/2017 2:41 PM

## 2017-07-31 ENCOUNTER — Encounter: Payer: Self-pay | Admitting: Cardiovascular Disease

## 2017-08-19 ENCOUNTER — Encounter (HOSPITAL_COMMUNITY): Payer: Self-pay | Admitting: *Deleted

## 2017-08-19 ENCOUNTER — Other Ambulatory Visit: Payer: Self-pay | Admitting: Gastroenterology

## 2017-08-19 NOTE — Progress Notes (Addendum)
Anesthesia Chart Review:  Pt is a 61 year old male scheduled for colonoscopy on 08/22/2017 with [name], MD  - PCP is [name], MD - Cardiologist is [name], MD who is aware of upcoming procedure; last office visit 07/29/17  PMH includes:  CAD (s/p DES to RCA, overlapping DES to LAD 2012; s/p overlapping DES to LAD, DES to OM2 07/2016), chronic combined systolic and diastolic HF, ischemic cardiomyopathy, HTN, DM, hyperlipidemia, pulmonary sarcoidosis, CKD (stage 3), GERD. Never smoker. BMI 45. S/p R transmetatarsal amputation 07/24/16.   Medications include: Amlodipine, ASA 81 mg, Brilinta, hydralazine, Novolin N, Novolin R, Imdur, metoprolol, Protonix, rosuvastatin. Pt to hold brilinita 5 days before procedure.   Labs per endo protocol.   CXR 10/11/16: Background pattern of cardiomegaly, atherosclerosis and chronic interstitial lung disease.  Suspicion of superimposed mild acute interstitial edema.  EKG 07/29/17: sinus rhythm with 1st degree AV block.   Nuclear stress test 10/13/16:   There was no ST segment deviation noted during stress.  The left ventricular ejection fraction is moderately decreased (30-44%).  Nuclear stress EF: 39%. The LV function has decreased compared to previous LV assessments by cath and echo.  There is no evidence of ischemia.  This is an intermediate risk study based on the reduced LV function. There is no reversible ischemia .  Cardiac cath 07/29/16  Mid RCA lesion, 50 %stenosed.  Mid RCA to Dist RCA lesion, 10 %stenosed.  RPDA lesion, 30 %stenosed.  Mid Cx lesion, 75 %stenosed.  3rd Mrg lesion, 30 %stenosed.  Ost 1st Diag to 1st Diag lesion, 40 %stenosed.  Dist LAD lesion, 10 %stenosed.  Ost LAD to Prox LAD lesion, 85 %stenosed.  A STENT SYNERGY DES 3.5X20 drug eluting stent was successfully placed, and overlaps previously placed stent.  Prox LAD to Mid LAD lesion, 85 %stenosed.  Post intervention, there is a 0% residual  stenosis.  A STENT SYNERGY DES [id number] drug eluting stent was successfully placed, and does not overlap previously placed stent.  2nd Mrg lesion, 95 %stenosed.  Post intervention, there is a 0% residual stenosis.    Successful stenting of proximal 85% LAD stenosis with a Synergy 3.5 x 20 drug-eluting stent from the LAD ostium to the proximal vessel overlapping previously placed stents. A 0% stenosis is noted post implantation. Final postdilatation diameter of 3.75 mm.  Successful stenting of the second obtuse marginal from 90% to 0% using a Synergy 2.75 x 16 mm DES deployed at 14 atm. Postdilatation was not performed.  Carotid duplex 07/20/16:  - Findings are consistent with a 1-39 percent stenosis involving the right internal carotid artery and the left internal carotid artery.  - Vertebral arteries demonstrate antegrade flow. Right ABI is suggestive of mild arterial disease at rest and the left is within normal limits at rest but may be falsely elevated due to medial calcification.  Echo 07/31/15:  - Left ventricle: The cavity size was normal. Wall thickness was increased in a pattern of moderate LVH. Systolic function was mildly reduced. The estimated ejection fraction was in the range of 45% to 50%. There is akinesis of the mid-apicalanteroseptal myocardium. Doppler parameters are consistent with abnormal left ventricular relaxation (grade 1 diastolic dysfunction).  If no changes, I anticipate pt can proceed with surgery as scheduled.   Willeen Cass, FNP-BC [facility]/Anesthesiology Phone: [phone] 08/19/2017 1:01 PM

## 2017-08-19 NOTE — Progress Notes (Addendum)
Pt denies any acute cardiopulmonary issues. Pt under the care of Dr. Claiborne Billings, Cardiology. Pt made aware to stop taking vitamins, fish oil, and herbal medications. Do not take any NSAIDs ie: Ibuprofen, Advil, Naproxen (Aleve), Motrin, BC and Goody Powder. Pt stated that he was instructed to stop taking Brilinta, last dose was Tuesday. Pt stated that he was instructed to take only half dose of insulin starting tomorrow. (Sat.). Pt made aware to check BG every 2 hours prior to arrival to hospital on DOS. Pt made aware to treat a BG < 70 with 4 ounces of apple or cranberry juice, wait 15 minutes after intervention to recheck BG, if BG remains < 70, call Short Stay unit to speak with a nurse. Pt verbalized understanding of all pre-op instructions.

## 2017-08-22 ENCOUNTER — Ambulatory Visit (HOSPITAL_COMMUNITY): Payer: BLUE CROSS/BLUE SHIELD | Admitting: Emergency Medicine

## 2017-08-22 ENCOUNTER — Encounter (HOSPITAL_COMMUNITY)
Admission: RE | Disposition: A | Discharge status: Home / Self Care | Payer: Self-pay | Source: Ambulatory Visit | Attending: Gastroenterology

## 2017-08-22 ENCOUNTER — Ambulatory Visit (HOSPITAL_COMMUNITY)
Admission: RE | Admit: 2017-08-22 | Discharge: 2017-08-22 | Disposition: A | Discharge status: Home / Self Care | Payer: BLUE CROSS/BLUE SHIELD | Source: Ambulatory Visit | Attending: Gastroenterology | Admitting: Gastroenterology

## 2017-08-22 ENCOUNTER — Encounter (HOSPITAL_COMMUNITY): Payer: Self-pay | Admitting: *Deleted

## 2017-08-22 DIAGNOSIS — G4733 Obstructive sleep apnea (adult) (pediatric): Secondary | ICD-10-CM | POA: Diagnosis not present

## 2017-08-22 DIAGNOSIS — Z7982 Long term (current) use of aspirin: Secondary | ICD-10-CM | POA: Diagnosis not present

## 2017-08-22 DIAGNOSIS — E1122 Type 2 diabetes mellitus with diabetic chronic kidney disease: Secondary | ICD-10-CM | POA: Diagnosis not present

## 2017-08-22 DIAGNOSIS — I5042 Chronic combined systolic (congestive) and diastolic (congestive) heart failure: Secondary | ICD-10-CM | POA: Diagnosis not present

## 2017-08-22 DIAGNOSIS — I255 Ischemic cardiomyopathy: Secondary | ICD-10-CM | POA: Diagnosis not present

## 2017-08-22 DIAGNOSIS — I252 Old myocardial infarction: Secondary | ICD-10-CM | POA: Diagnosis not present

## 2017-08-22 DIAGNOSIS — I2510 Atherosclerotic heart disease of native coronary artery without angina pectoris: Secondary | ICD-10-CM | POA: Insufficient documentation

## 2017-08-22 DIAGNOSIS — E785 Hyperlipidemia, unspecified: Secondary | ICD-10-CM | POA: Insufficient documentation

## 2017-08-22 DIAGNOSIS — K3184 Gastroparesis: Secondary | ICD-10-CM | POA: Diagnosis not present

## 2017-08-22 DIAGNOSIS — Q438 Other specified congenital malformations of intestine: Secondary | ICD-10-CM | POA: Insufficient documentation

## 2017-08-22 DIAGNOSIS — F329 Major depressive disorder, single episode, unspecified: Secondary | ICD-10-CM | POA: Diagnosis not present

## 2017-08-22 DIAGNOSIS — I130 Hypertensive heart and chronic kidney disease with heart failure and stage 1 through stage 4 chronic kidney disease, or unspecified chronic kidney disease: Secondary | ICD-10-CM | POA: Diagnosis not present

## 2017-08-22 DIAGNOSIS — Z955 Presence of coronary angioplasty implant and graft: Secondary | ICD-10-CM | POA: Diagnosis not present

## 2017-08-22 DIAGNOSIS — E1143 Type 2 diabetes mellitus with diabetic autonomic (poly)neuropathy: Secondary | ICD-10-CM | POA: Diagnosis not present

## 2017-08-22 DIAGNOSIS — Z794 Long term (current) use of insulin: Secondary | ICD-10-CM | POA: Diagnosis not present

## 2017-08-22 DIAGNOSIS — Z791 Long term (current) use of non-steroidal anti-inflammatories (NSAID): Secondary | ICD-10-CM | POA: Diagnosis not present

## 2017-08-22 DIAGNOSIS — K219 Gastro-esophageal reflux disease without esophagitis: Secondary | ICD-10-CM | POA: Diagnosis not present

## 2017-08-22 DIAGNOSIS — M109 Gout, unspecified: Secondary | ICD-10-CM | POA: Diagnosis not present

## 2017-08-22 DIAGNOSIS — Z79899 Other long term (current) drug therapy: Secondary | ICD-10-CM | POA: Diagnosis not present

## 2017-08-22 DIAGNOSIS — D123 Benign neoplasm of transverse colon: Secondary | ICD-10-CM | POA: Insufficient documentation

## 2017-08-22 DIAGNOSIS — Z01810 Encounter for preprocedural cardiovascular examination: Secondary | ICD-10-CM

## 2017-08-22 DIAGNOSIS — K648 Other hemorrhoids: Secondary | ICD-10-CM | POA: Diagnosis not present

## 2017-08-22 DIAGNOSIS — Z1211 Encounter for screening for malignant neoplasm of colon: Secondary | ICD-10-CM | POA: Insufficient documentation

## 2017-08-22 HISTORY — DX: Hemorrhage of anus and rectum: K62.5

## 2017-08-22 HISTORY — DX: Anemia, unspecified: D64.9

## 2017-08-22 HISTORY — PX: COLONOSCOPY WITH PROPOFOL: SHX5780

## 2017-08-22 LAB — GLUCOSE, CAPILLARY: Glucose-Capillary: 87 mg/dL (ref 65–99)

## 2017-08-22 SURGERY — COLONOSCOPY WITH PROPOFOL
Anesthesia: Monitor Anesthesia Care

## 2017-08-22 MED ORDER — SODIUM CHLORIDE 0.9 % IV SOLN
INTRAVENOUS | Status: DC
  Administered 2017-08-22: 09:00:00 via INTRAVENOUS

## 2017-08-22 MED ORDER — PROPOFOL 500 MG/50ML IV EMUL
INTRAVENOUS | Status: DC | PRN
  Administered 2017-08-22: 75 ug/kg/min via INTRAVENOUS

## 2017-08-22 MED ORDER — PROPOFOL 10 MG/ML IV BOLUS
INTRAVENOUS | Status: DC | PRN
  Administered 2017-08-22 (×2): 20 mg via INTRAVENOUS

## 2017-08-22 MED ORDER — SODIUM CHLORIDE 0.9 % IV SOLN
INTRAVENOUS | Status: DC
  Administered 2017-08-22: 08:00:00 via INTRAVENOUS

## 2017-08-22 SURGICAL SUPPLY — 22 items

## 2017-08-22 NOTE — Discharge Instructions (Signed)
YOU HAD AN ENDOSCOPIC PROCEDURE TODAY: Refer to the procedure report and other information in the discharge instructions given to you for any specific questions about what was found during the examination. If this information does not answer your questions, please call [facility] office at [phone] to clarify.   YOU SHOULD EXPECT: Some feelings of bloating in the abdomen. Passage of more gas than usual. Walking can help get rid of the air that was put into your GI tract during the procedure and reduce the bloating. If you had a lower endoscopy (such as a colonoscopy or flexible sigmoidoscopy) you may notice spotting of blood in your stool or on the toilet paper. Some abdominal soreness may be present for a day or two, also.  DIET: Your first meal following the procedure should be a light meal and then it is ok to progress to your normal diet. A half-sandwich or bowl of soup is an example of a good first meal. Heavy or fried foods are harder to digest and may make you feel nauseous or bloated. Drink plenty of fluids but you should avoid alcoholic beverages for 24 hours. If you had a esophageal dilation, please see attached instructions for diet.   ACTIVITY: Your care partner should take you home directly after the procedure. You should plan to take it easy, moving slowly for the rest of the day. You can resume normal activity the day after the procedure however YOU SHOULD NOT DRIVE, use power tools, machinery or perform tasks that involve climbing or major physical exertion for 24 hours (because of the sedation medicines used during the test).   SYMPTOMS TO REPORT IMMEDIATELY: A gastroenterologist can be reached at any hour. Please call [phone]  for any of the following symptoms:  Following lower endoscopy (colonoscopy, flexible sigmoidoscopy) Excessive amounts of blood in the stool  Significant tenderness, worsening of abdominal pains  Swelling of the abdomen that is new, acute  Fever of 100 or  higher    FOLLOW UP:  If any biopsies were taken you will be contacted by phone or by letter within the next 1-3 weeks. Call [phone]  if you have not heard about the biopsies in 3 weeks.  Please also call with any specific questions about appointments or follow up tests.   Monitored Anesthesia Care, Care After These instructions provide you with information about caring for yourself after your procedure. Your health care provider may also give you more specific instructions. Your treatment has been planned according to current medical practices, but problems sometimes occur. Call your health care provider if you have any problems or questions after your procedure. What can I expect after the procedure? After your procedure, it is common to:  Feel sleepy for several hours.  Feel clumsy and have poor balance for several hours.  Feel forgetful about what happened after the procedure.  Have poor judgment for several hours.  Feel nauseous or vomit.  Have a sore throat if you had a breathing tube during the procedure.  Follow these instructions at home: For at least 24 hours after the procedure:   Do not: ? Participate in activities in which you could fall or become injured. ? Drive. ? Use heavy machinery. ? Drink alcohol. ? Take sleeping pills or medicines that cause drowsiness. ? Make important decisions or sign legal documents. ? Take care of children on your own.  Rest. Eating and drinking  Follow the diet that is recommended by your health care provider.  If you  vomit, drink water, juice, or soup when you can drink without vomiting.  Make sure you have little or no nausea before eating solid foods. General instructions  Have a responsible adult stay with you until you are awake and alert.  Take over-the-counter and prescription medicines only as told by your health care provider.  If you smoke, do not smoke without supervision.  Keep all follow-up visits as  told by your health care provider. This is important. Contact a health care provider if:  You keep feeling nauseous or you keep vomiting.  You feel light-headed.  You develop a rash.  You have a fever. Get help right away if:  You have trouble breathing. This information is not intended to replace advice given to you by your health care provider. Make sure you discuss any questions you have with your health care provider. Document Released: 03/21/2016 Document Revised: 07/21/2016 Document Reviewed: 03/21/2016 Elsevier Interactive Patient Education  2018 [employer].  Will call when pathology results are complete.

## 2017-08-22 NOTE — Transfer of Care (Signed)
Immediate Anesthesia Transfer of Care Note  Patient: Joshua [name]  Procedure(s) Performed: Procedure(s): COLONOSCOPY WITH PROPOFOL (N/A)  Patient Location: Endoscopy Unit  Anesthesia Type:MAC  Level of Consciousness: drowsy and patient cooperative  Airway & Oxygen Therapy: Patient Spontanous Breathing and Patient connected to nasal cannula oxygen  Post-op Assessment: Report given to RN, Post -op Vital signs reviewed and stable and Patient moving all extremities X 4  Post vital signs: Reviewed and stable  Last Vitals:  Vitals:   08/22/17 0807 08/22/17 0946  BP: (!) 186/79 (!) 125/43  Pulse: 65 (!) 58  Resp: 14 19  Temp: 36.7 C 36.5 C  SpO2: 100% 100%    Last Pain:  Vitals:   08/22/17 0946  TempSrc: Oral         Complications: No apparent anesthesia complications

## 2017-08-22 NOTE — Op Note (Signed)
[facility] Patient Name: Joshua [name] Procedure Date : 08/22/2017 MRN: [id number] Attending MD: Lear Ng , MD Date of Birth: 1956-02-01 CSN: [id number] Age: 61 Admit Type: Outpatient Procedure:                Colonoscopy Indications:              Screening for colorectal malignant neoplasm, This                            is the patient's first colonoscopy Providers:                Lear Ng, MD, Cleda Daub, RN, Cletis [place], Technician Referring MD:              Medicines:                Propofol per Anesthesia, Monitored Anesthesia Care Complications:            No immediate complications. Estimated Blood Loss:     Estimated blood loss: none. Procedure:                Pre-Anesthesia Assessment:                           - Prior to the procedure, a History and Physical                            was performed, and patient medications and                            allergies were reviewed. The patient's tolerance of                            previous anesthesia was also reviewed. The risks                            and benefits of the procedure and the sedation                            options and risks were discussed with the patient.                            All questions were answered, and informed consent                            was obtained. Prior Anticoagulants: The patient has                            taken Brilinta, last dose was 5 days prior to                            procedure. ASA Grade Assessment: IV - A patient  with severe systemic disease that is a constant                            threat to life. After reviewing the risks and                            benefits, the patient was deemed in satisfactory                            condition to undergo the procedure.                           After obtaining informed consent, the colonoscope    was passed under direct vision. Throughout the                            procedure, the patient's blood pressure, pulse, and                            oxygen saturations were monitored continuously. The                            EC-[id number] ([id number]) scope was introduced through                            the anus and advanced to the the cecum, identified                            by appendiceal orifice and ileocecal valve. The                            colonoscopy was performed with difficulty due to                            significant looping, a tortuous colon and fair                            prep. Successful completion of the procedure was                            aided by straightening and shortening the scope to                            obtain bowel loop reduction, applying abdominal                            pressure and lavage. The patient tolerated the                            procedure fairly well. The quality of the bowel                            preparation was fair. The ileocecal valve,  appendiceal orifice, and rectum were photographed. Scope In: 9:16:53 AM Scope Out: 9:41:19 AM Scope Withdrawal Time: 0 hours 14 minutes 4 seconds  Total Procedure Duration: 0 hours 24 minutes 26 seconds  Findings:      The perianal and digital rectal examinations were normal.      A 20 mm polyp was found in the transverse colon. The polyp was       pedunculated. The polyp was removed with a hot snare. Resection and       retrieval were complete. Estimated blood loss: none.      Internal hemorrhoids were found during retroflexion. The hemorrhoids       were small and Grade I (internal hemorrhoids that do not prolapse). Impression:               - Preparation of the colon was fair.                           - One 20 mm polyp in the transverse colon, removed                            with a hot snare. Resected and retrieved.                            - Internal hemorrhoids. Moderate Sedation:      N/A - MAC procedure Recommendation:           - Await pathology results.                           - Patient has a contact number available for                            emergencies. The signs and symptoms of potential                            delayed complications were discussed with the                            patient. Return to normal activities tomorrow.                            Written discharge instructions were provided to the                            patient.                           - High fiber diet.                           - Resume Brilinta at prior dose in 3 days. Refer to                            managing physician for further adjustment of                            therapy.                           -  Repeat colonoscopy for surveillance based on                            pathology results. Procedure Code(s):        --- Professional ---                           [phone], Colonoscopy, flexible; with removal of                            tumor(s), polyp(s), or other lesion(s) by snare                            technique Diagnosis Code(s):        --- Professional ---                           Z12.11, Encounter for screening for malignant                            neoplasm of colon                           D12.3, Benign neoplasm of transverse colon (hepatic                            flexure or splenic flexure)                           K64.0, First degree hemorrhoids CPT copyright 2016 American Medical Association. All rights reserved. The codes documented in this report are preliminary and upon coder review may  be revised to meet current compliance requirements. Lear Ng, MD 08/22/2017 9:50:44 AM This report has been signed electronically. Number of Addenda: 0

## 2017-08-22 NOTE — Anesthesia Preprocedure Evaluation (Addendum)
Anesthesia Evaluation  Patient identified by MRN, date of birth, ID band Patient awake    Reviewed: Allergy & Precautions, NPO status , Patient's Chart, lab work & pertinent test results  Airway Mallampati: II  TM Distance: >3 FB Neck ROM: Full    Dental  (+) Dental Advisory Given, Teeth Intact   Pulmonary shortness of breath and with exertion, sleep apnea and Continuous Positive Airway Pressure Ventilation ,  Sarcoidosis   breath sounds clear to auscultation       Cardiovascular hypertension, + CAD, + Past MI, + Cardiac Stents and +CHF   Rhythm:Regular Rate:Normal  Cartoid [place] 07/2016 - b/l 1-39% ICAS  TTE 2016 - Left ventricle: Moderate LVH. The estimated ejection fraction was in the range of 45% to 50%. There is akinesis of the mid-apicalanteroseptal. Grade 1 diastolic dysfunction.   Neuro/Psych PSYCHIATRIC DISORDERS Depression Diabetic polyneuropathy & gastroparesis  Neuromuscular disease    GI/Hepatic Neg liver ROS, GERD  Medicated and Controlled,  Endo/Other  diabetes, Type 2Morbid obesity  Renal/GU CRFRenal disease  negative genitourinary   Musculoskeletal negative musculoskeletal ROS (+)   Abdominal   Peds  Hematology negative hematology ROS (+)   Anesthesia Other Findings   Reproductive/Obstetrics                            2016 Echo  - Left ventricle: The cavity size was normal. Wall thickness was   increased in a pattern of moderate LVH. Systolic function was   mildly reduced. The estimated ejection fraction was in the range   of 45% to 50%. There is akinesis of the mid-apicalanteroseptal   myocardium. Doppler parameters are consistent with abnormal left   ventricular relaxation (grade 1 diastolic dysfunction).   Anesthesia Physical  Anesthesia Plan  ASA: IV  Anesthesia Plan: MAC   Post-op Pain Management:    Induction: Intravenous  PONV Risk Score and Plan: 1 and  Propofol infusion and Treatment may vary due to age or medical condition  Airway Management Planned: Nasal Cannula  Additional Equipment: None  Intra-op Plan:   Post-operative Plan:   Informed Consent: I have reviewed the patients History and Physical, chart, labs and discussed the procedure including the risks, benefits and alternatives for the proposed anesthesia with the patient or authorized representative who has indicated his/her understanding and acceptance.   Dental advisory given  Plan Discussed with: CRNA  Anesthesia Plan Comments: ( )        Anesthesia Quick Evaluation

## 2017-08-22 NOTE — Interval H&P Note (Signed)
History and Physical Interval Note:  08/22/2017 9:05 AM  Joshua [name]  has presented today for surgery, with the diagnosis of screening  The various methods of treatment have been discussed with the patient and family. After consideration of risks, benefits and other options for treatment, the patient has consented to  Procedure(s): COLONOSCOPY WITH PROPOFOL (N/A) as a surgical intervention .  The patient's history has been reviewed, patient examined, no change in status, stable for surgery.  I have reviewed the patient's chart and labs.  Questions were answered to the patient's satisfaction.     [place] C.

## 2017-08-22 NOTE — Anesthesia Postprocedure Evaluation (Signed)
Anesthesia Post Note  Patient: Joshua [name]  Procedure(s) Performed: Procedure(s) (LRB): COLONOSCOPY WITH PROPOFOL (N/A)     Patient location during evaluation: PACU Anesthesia Type: MAC Level of consciousness: awake and alert Pain management: pain level controlled Vital Signs Assessment: post-procedure vital signs reviewed and stable Respiratory status: spontaneous breathing, nonlabored ventilation and respiratory function stable Cardiovascular status: stable and blood pressure returned to baseline Anesthetic complications: no    Last Vitals:  Vitals:   08/22/17 0956 08/22/17 1005  BP: (!) 167/81 (!) 179/80  Pulse: 64 65  Resp: (!) 24 17  Temp:    SpO2: 100% 99%    Last Pain:  Vitals:   08/22/17 0946  TempSrc: Oral                 [name]

## 2017-08-22 NOTE — H&P (Signed)
Primary Care Physician:  [name], MD Primary Gastroenterologist:  Dr. Michail Sermon  Reason for Consultation:  Screening  HPI: Joshua [name] is a 61 y.o. male with multiple medical problems as stated below and a history of constipation with straining earlier this summer. Saw blood with wiping when he had the straining. Moves his bowels once every few days. On Brilinta due to CAD with coronary stents. Diabetic on insulin.  Past Medical History:  Diagnosis Date  . Anemia   . Chronic combined systolic and diastolic CHF (congestive heart failure) ([place])    a. 07/2015 Echo: EF 45-50%, mod LVH, mid apical/antsept AK, Gr 1 DD.  [name] CKD (chronic kidney disease), stage III   . Coronary artery disease    a. 2012 s/p MI/PCI: s/p DES to mid/dist RCA and overlapping DES to LAD;  b. 07/2015 Cath: LAD 10% ISR, D1 40(jailed), LCX 35m, OM2 30, OM3 30, RCA 2m ISR, 10d ISR; c. 07/2016 NSTEMI/PCI: LAD 85ost/p/m (3.5x20 Synergy DES overlapping prior stent), D1 40, LCX 79m, OM2 95 (2.75x16 Synergy DES), OM3 30, RCA 27m, 10d.  . Depression   . Diabetic gastroparesis ([place])   . GERD (gastroesophageal reflux disease)   . Gout   . Gout   . Heart murmur   . Hyperlipemia   . Hypertensive heart disease   . Ischemic cardiomyopathy    a. 07/2015 Echo: EF 45-50%, mod LVH, mid-apicalanteroseptal AK, Gr1 DD.  . Morbid obesity ([place])   . Myocardial infarction ([place]) 2012  . OSA on CPAP   . Osteomyelitis ([place])    a. 07/2016 MTP joint of R great toe s/p transmetatarsal amputation.  . Polyneuropathy in diabetes(357.2)   . Pulmonary sarcoidosis ([place])    "problems w/it years ago; no problems anymore" (07/03/2014)  . Rectal bleeding   . Type II diabetes mellitus ([place])     Past Surgical History:  Procedure Laterality Date  . AMPUTATION Right 07/24/2016   Procedure: TRANSMETATARSAL FOOT AMPUTATION;  Surgeon: [name], MD;  Location: [facility];  Service: Orthopedics;  Laterality: Right;  . BREAST LUMPECTOMY Right ~ 2000    "benign"  . CARDIAC CATHETERIZATION N/A 07/30/2015   Procedure: Left Heart Cath and Coronary Angiography;  Surgeon: Burnell Blanks, MD;  Location: [facility];  Service: Cardiovascular;  Laterality: N/A;  . CARDIAC CATHETERIZATION N/A 07/19/2016   Procedure: Left Heart Cath and Coronary Angiography;  Surgeon: Belva Crome, MD;  Location: [facility];  Service: Cardiovascular;  Laterality: N/A;  . CARDIAC CATHETERIZATION N/A 07/29/2016   Procedure: Coronary Stent Intervention;  Surgeon: Belva Crome, MD;  Location: [facility];  Service: Cardiovascular;  Laterality: N/A;  . CARDIAC CATHETERIZATION N/A 07/29/2016   Procedure: Intravascular Ultrasound/IVUS;  Surgeon: Belva Crome, MD;  Location: [facility];  Service: Cardiovascular;  Laterality: N/A;  . CATARACT EXTRACTION W/ INTRAOCULAR LENS  IMPLANT, BILATERAL Bilateral 2014-2015  . CORONARY ANGIOPLASTY WITH STENT PLACEMENT  10/31/2011   30 % MID ATRIOVENTRICULAR GROOVE CX STENOSIS. 90 % MID RCA.PTA TO LAD/STENTING OF TANDEM 60-70%. LAD STENOSIS 99% REDUCED TO 0%.3.0 X 32MM PROMUS DES POSTDILATED TO 3.25MM.  [name] CORONARY ANGIOPLASTY WITH STENT PLACEMENT  07/03/2014   "2"  . I&D EXTREMITY Right 10/30/2013   Procedure: IRRIGATION AND DEBRIDEMENT RIGHT SMALL FINGER POSSIBLE SECONDARY WOUND CLOSURE;  Surgeon: [name], MD;  Location: [facility];  Service: Orthopedics;  Laterality: Right;  [name] is available after 4pm  . I&D EXTREMITY Right 11/01/2013  Procedure:  IRRIGATION AND DEBRIDEMENT RIGHT  PROXIMAL PHALANGEAL LEVEL AMPUTATION;  Surgeon: [name], MD;  Location: [facility];  Service: Orthopedics;  Laterality: Right;  . INCISION AND DRAINAGE Right 10/28/2013   Procedure: INCISION AND DRAINAGE Right small finger;  Surgeon: [name], MD;  Location: [facility];  Service: Orthopedics;  Laterality: Right;  . LEFT HEART CATH Left 11/03/2011   Procedure: LEFT HEART CATH;  Surgeon: [name],  MD;  Location: [facility];  Service: Cardiovascular;  Laterality: Left;  . LEFT HEART CATHETERIZATION WITH CORONARY ANGIOGRAM N/A 07/03/2014   Procedure: LEFT HEART CATHETERIZATION WITH CORONARY ANGIOGRAM;  Surgeon: [name], MD;  Location: [facility];  Service: Cardiovascular;  Laterality: N/A;  . PERCUTANEOUS CORONARY STENT INTERVENTION (PCI-S) Left 11/03/2011   Procedure: PERCUTANEOUS CORONARY STENT INTERVENTION (PCI-S);  Surgeon: [name], MD;  Location: [facility];  Service: Cardiovascular;  Laterality: Left;  . TOE AMPUTATION Right ~ 2010   "2nd toe"  . TOE AMPUTATION Right 2012   "3rd toe"    Prior to Admission medications   Medication Sig Start Date End Date Taking? Authorizing Provider  acetaminophen (TYLENOL) 325 MG tablet Take 2 tablets (650 mg total) by mouth every 4 (four) hours as needed for headache or mild pain. 08/03/16  Yes Isaiah Serge, NP  allopurinol (ZYLOPRIM) 300 MG tablet Take 1 tablet (300 mg total) by mouth daily. 08/13/16  Yes Love, Ivan Anchors, PA-C  amLODipine (NORVASC) 10 MG tablet TAKE 1 TABLET BY MOUTH EVERY DAY 06/21/17  Yes [name], MD  insulin regular (NOVOLIN R,HUMULIN R) 100 units/mL injection Inject into the skin. Sliding scale---takes before he eats   Yes [provider]  isosorbide mononitrate (IMDUR) 120 MG 24 hr tablet Take 1 tablet (120 mg total) by mouth daily. 07/29/17  Yes [name], MD  metoprolol succinate (TOPROL XL) 100 MG 24 hr tablet Take 1 & 1/2  Tablets daily 07/29/17  Yes [name], MD  pantoprazole (PROTONIX) 40 MG tablet Take 1 tablet (40 mg total) by mouth daily. 08/13/16  Yes Love, Ivan Anchors, PA-C  rosuvastatin (CRESTOR) 40 MG tablet Take 1 tablet (40 mg total) by mouth daily. 03/23/17  Yes [name], MD  aspirin EC 81 MG tablet Take 81 mg by mouth daily.    [provider]  BRILINTA 90 MG TABS tablet TAKE 1 TABLET BY MOUTH TWICE A DAY 03/07/17   [name], MD  hydrALAZINE  (APRESOLINE) 100 MG tablet Take 1 tablet (100 mg total) by mouth 2 (two) times daily. 04/06/17   [name], MD  insulin NPH Human (HUMULIN N,NOVOLIN N) 100 UNIT/ML injection Inject 30 Units into the skin. Every morning and every evening    [provider]  nitroGLYCERIN (NITROSTAT) 0.4 MG SL tablet Place 1 tablet (0.4 mg total) under the tongue every 5 (five) minutes as needed for chest pain. 10/19/13   [name], MD    Scheduled Meds: Continuous Infusions: . sodium chloride    . sodium chloride 20 mL/hr at 08/22/17 0813   PRN Meds:.  Allergies as of 06/10/2017 - Review Complete 05/26/2017  Allergen Reaction Noted  . Chlorthalidone  05/22/2017    Family History  Problem Relation Age of Onset  . Heart attack Maternal Aunt   . Diabetes Maternal Grandmother   . Hypertension Maternal Grandmother     Social History   Social History  . Marital  status: Married    Spouse name: N/A  . Number of children: N/A  . Years of education: N/A   Occupational History  . Not on file.   Social History Main Topics  . Smoking status: Never Smoker  . Smokeless tobacco: Never Used  . Alcohol use Yes     Comment: social   . Drug use: No  . Sexual activity: Yes   Other Topics Concern  . Not on file   Social History Narrative  . No narrative on file    Review of Systems: All negative except as stated above in HPI.  Physical Exam: Vital signs: Vitals:   08/22/17 0807  BP: (!) 186/79  Pulse: 65  Resp: 14  Temp: 98.1 F (36.7 C)  SpO2: 100%     General:   Alert,  ,morbidly obese, pleasant and cooperative in NAD Head: normocephalic, atraumatic Eyes: anicteric sclera ENT: oropharynx clear Neck: supple, nontender Lungs:  Clear throughout to auscultation.   No wheezes, crackles, or rhonchi. No acute distress. Heart:  Regular rate and rhythm; no murmurs, clicks, rubs,  or gallops. Abdomen: soft, nontender, nondistended, obese  Rectal:  Deferred Ext: no  edema  GI:  Lab Results: No results for input(s): WBC, HGB, HCT, PLT in the last 72 hours. BMET No results for input(s): NA, K, CL, CO2, GLUCOSE, BUN, CREATININE, CALCIUM in the last 72 hours. LFT No results for input(s): PROT, ALBUMIN, AST, ALT, ALKPHOS, BILITOT, BILIDIR, IBILI in the last 72 hours. PT/INR No results for input(s): LABPROT, INR in the last 72 hours.   Studies/Results: No results found.  Impression/Plan: 61 yo here for a screening colonoscopy. Last Brilinta dose was 08/16/17. Propofol sedation. Informed consent obtained.    LOS: 0 days   Timnath C.  08/22/2017, 9:01 AM  Pager [phone]  AFTER 5 pm or on weekends please call [phone]

## 2017-09-19 IMAGING — DX DG CHEST 2V
2 series · 2 of 2 positions shown · non-contrast
Comparison: 06/23/2016

CLINICAL DATA: Left-sided chest pain for 2 weeks, initial encounter

EXAM:
CHEST  2 VIEW

[chest pa]
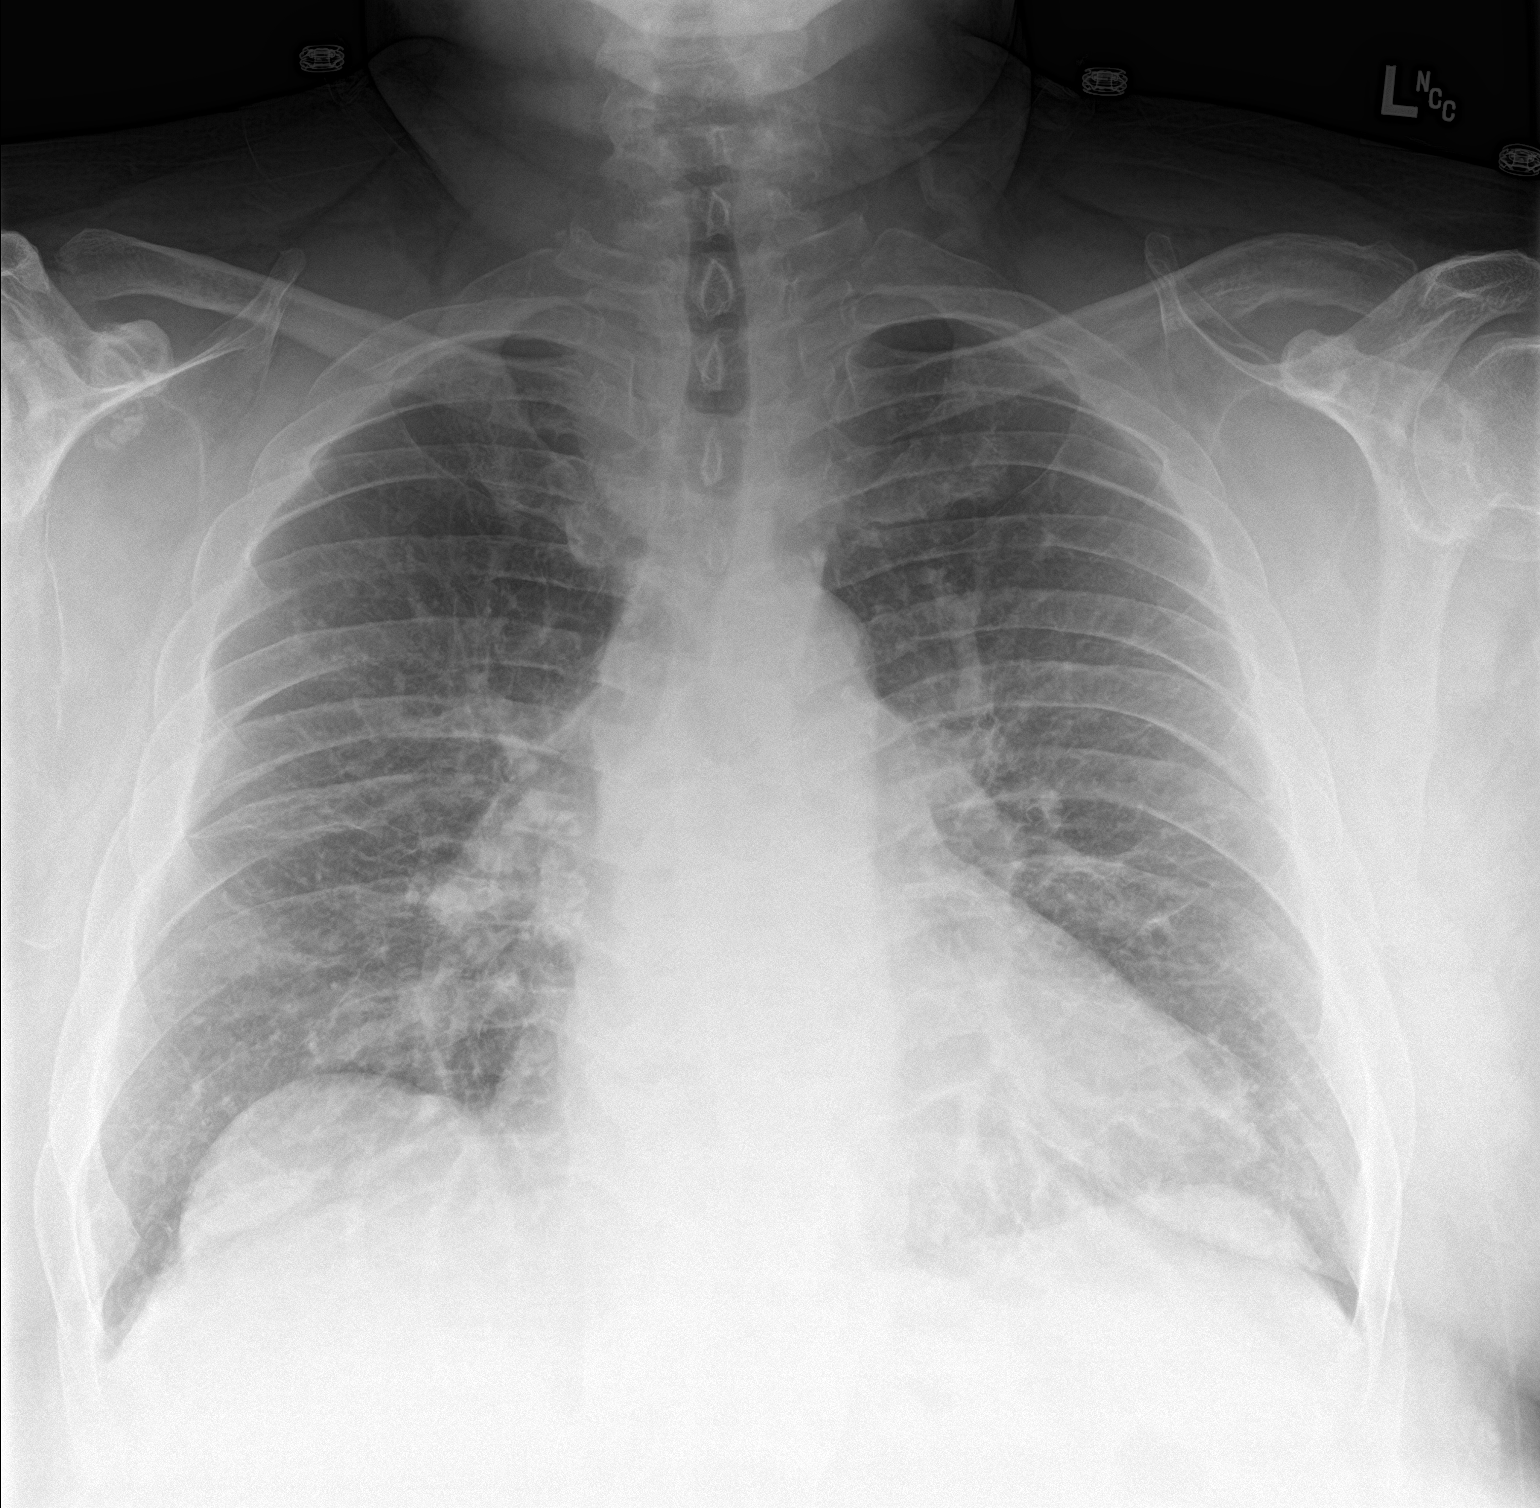

[chest lat]
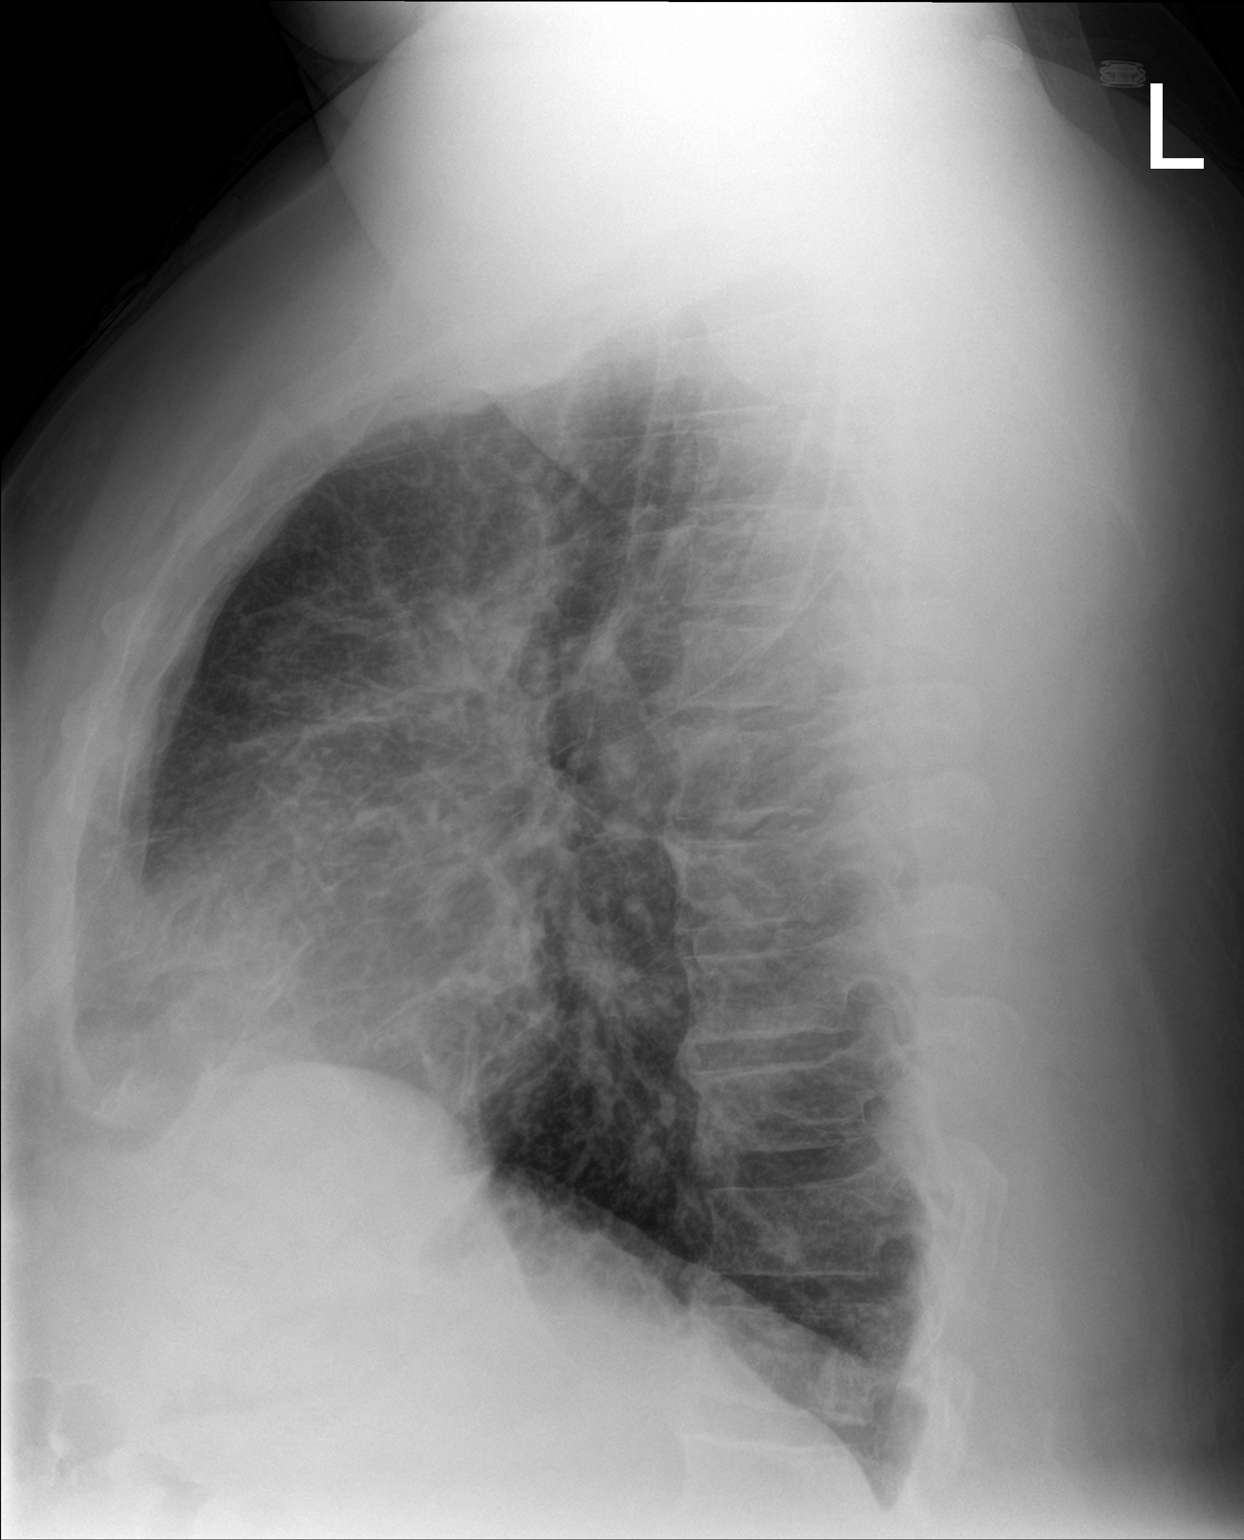

[2 of 2 positions shown; findings below may reference images not displayed]

FINDINGS: Cardiac shadow is at the upper limits of normal in size. Multiple
calcified granulomas are noted consistent with prior granulomatous
disease. No focal confluent infiltrate is seen. Mild bibasilar
scarring is again noted. Degenerative change of the thoracic spine
is seen.
IMPRESSION: No acute abnormality noted.

## 2017-09-24 IMAGING — MR MR FOOT*R* W/O CM
4 of 5 series · 19 of 40 positions shown · non-contrast
Comparison: MRI right foot 04/11/2016. Plain films the right foot
01/23/2016 and 07/16/2016.

CLINICAL DATA: Patient reports of bleeding wound of the right great
toe and fluctuance on the bottom of the right foot. Question abscess
or osteomyelitis.

EXAM:
MRI OF THE RIGHT FOREFOOT WITHOUT CONTRAST
TECHNIQUE: Multiplanar, multisequence MR imaging was performed. No intravenous
contrast was administered.

[Series 4: STIR · sagittal · 4.0mm · 0.35mm/px · 3 of 22 slices shown]
[im 5/22]
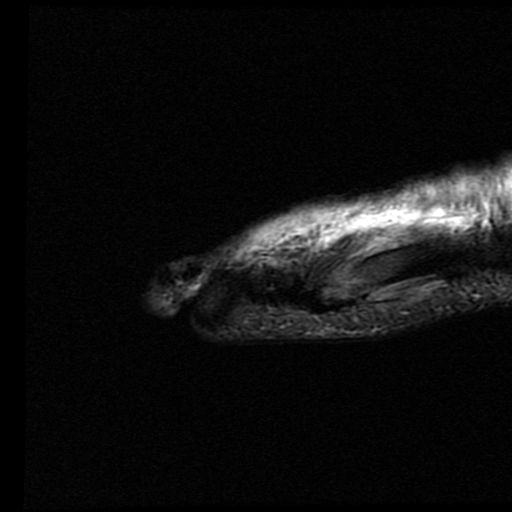
[im 13/22]
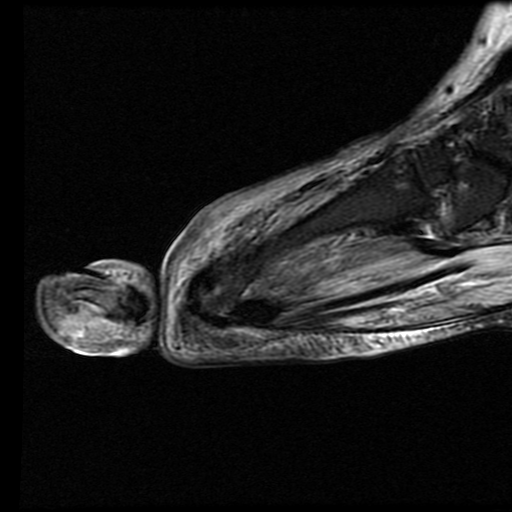
[im 22/22]
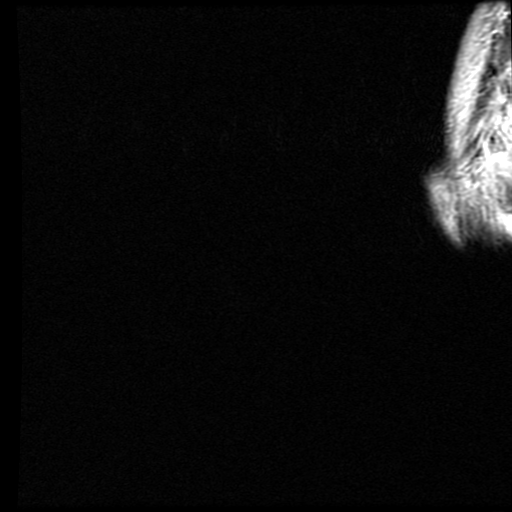

[Series 5: T1 · coronal · 4.0mm · 0.29mm/px · 3 of 37 slices shown (1 of 2)]
[im 4/37]
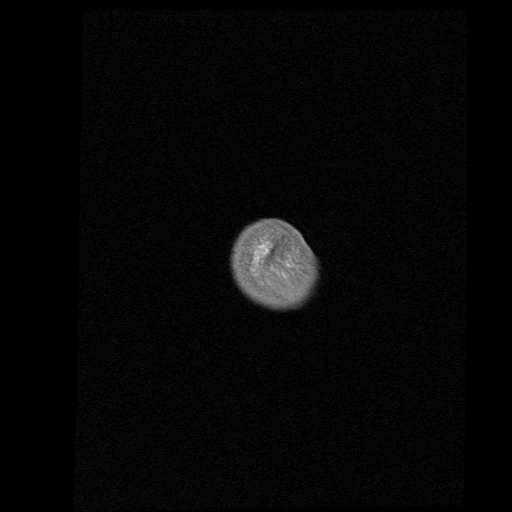
[im 19/37]
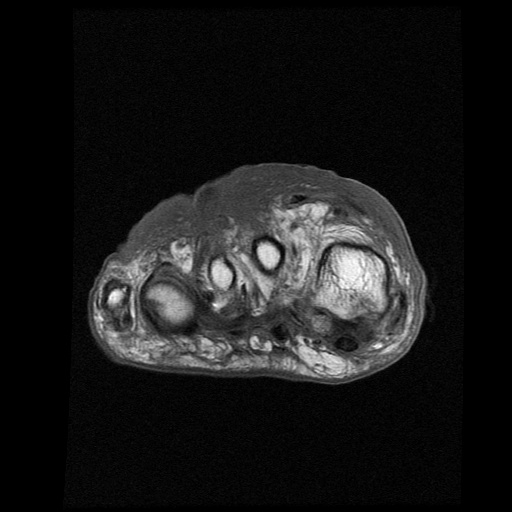
[im 33/37]
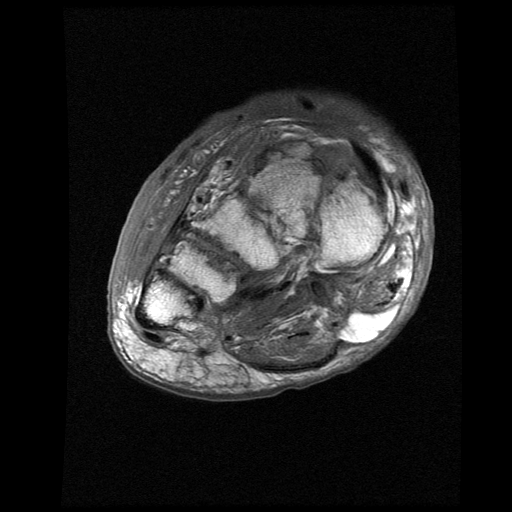

[Series 6: T2 fat-sat · coronal · 4.0mm · 0.29mm/px · 10 of 37 slices shown]
[im 1/37]
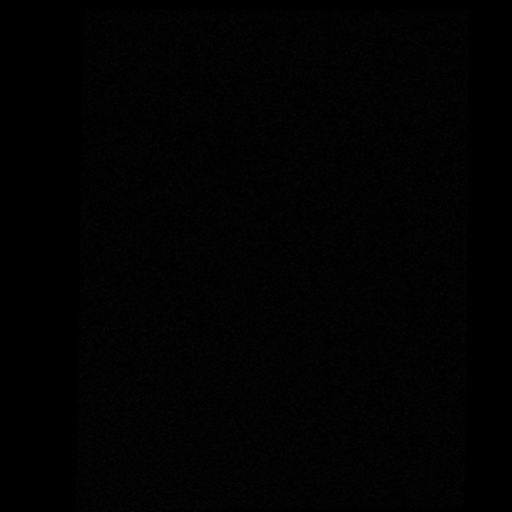
[im 4/37]
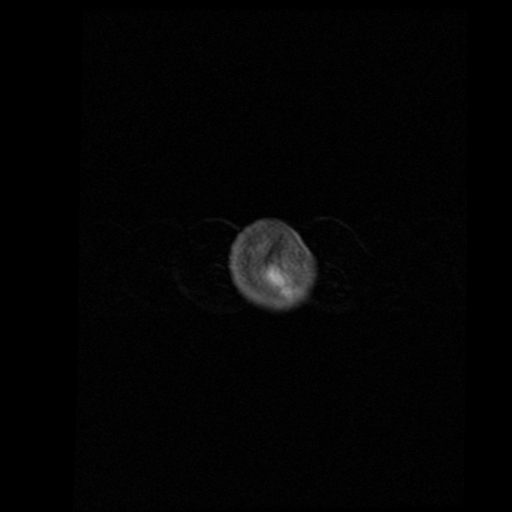
[im 8/37]
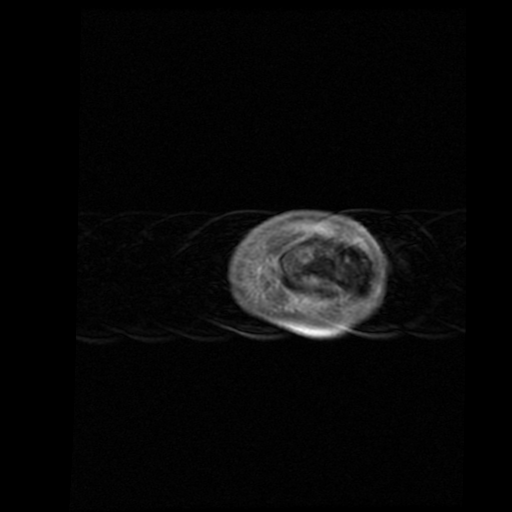
[im 11/37]
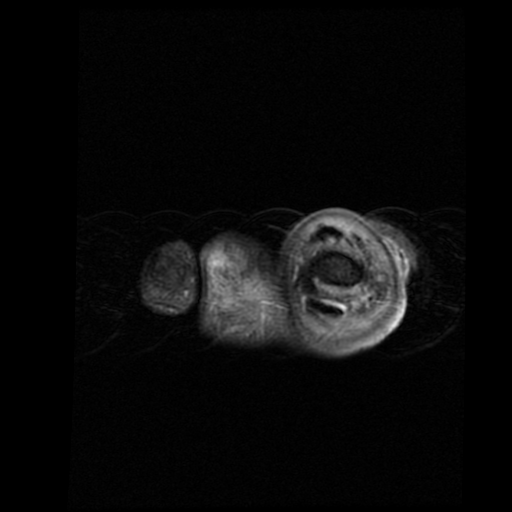
[im 15/37]
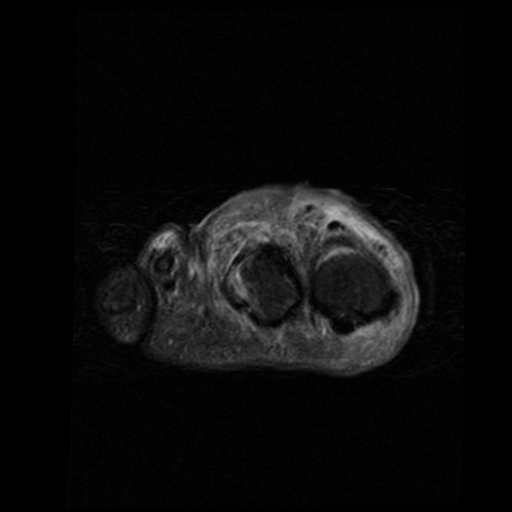
[im 19/37]
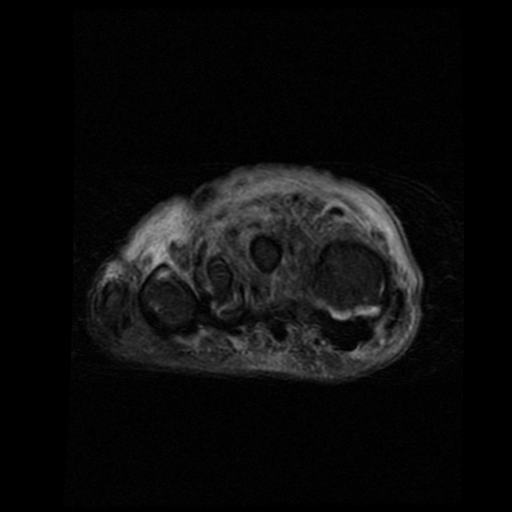
[im 22/37]
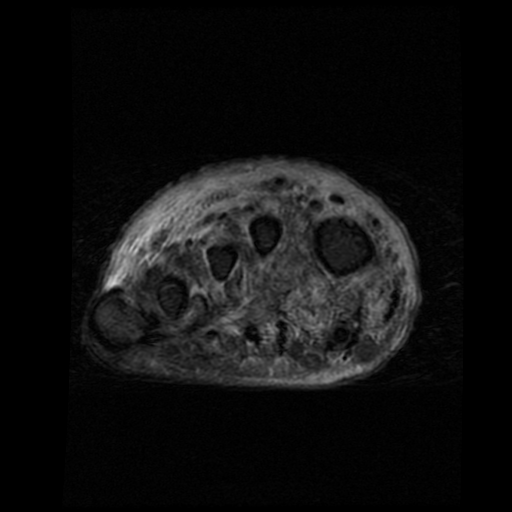
[im 26/37]
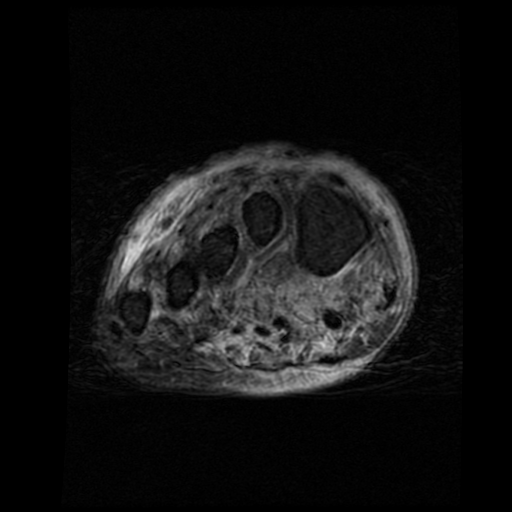
[im 29/37]
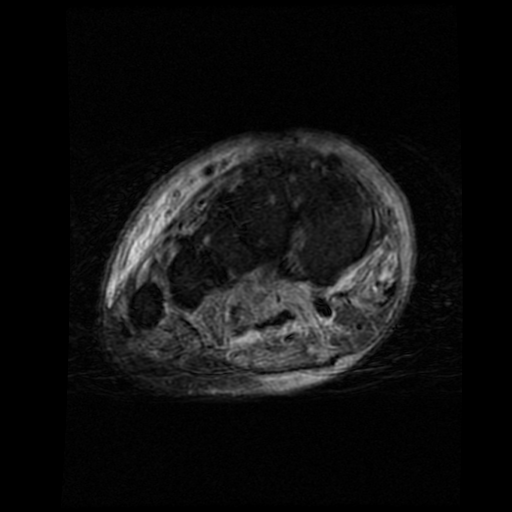
[im 33/37]
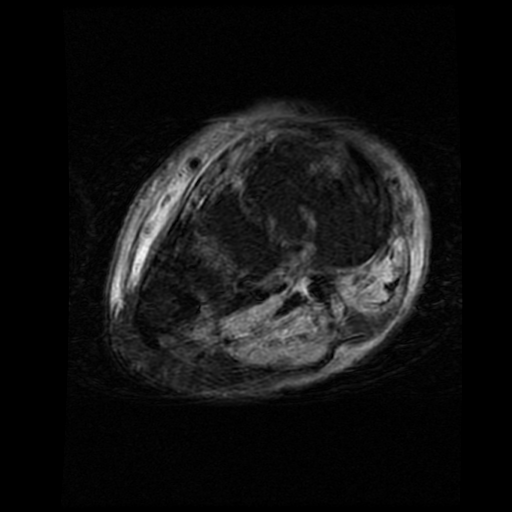

[Series 7: T1 · axial · 4.0mm · 0.39mm/px · z∈[-76,+2]mm · 3 of 20 slices shown (2 of 2)]
[im 4/20]
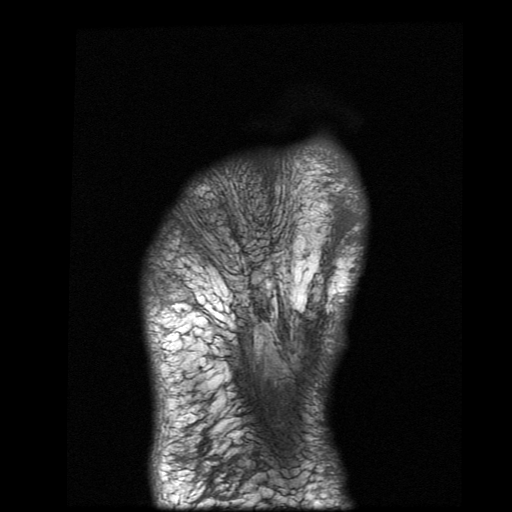
[im 12/20]
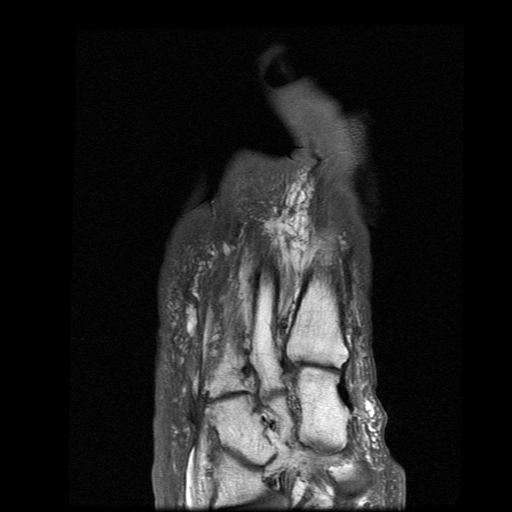
[im 20/20]
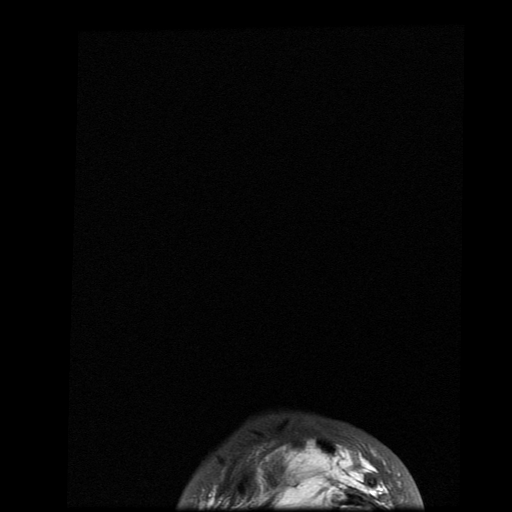

[19 of 40 positions shown; findings below may reference images not displayed]

FINDINGS: There is edema and corresponding decreased T1 signal throughout the
distal phalanx of the great toe consistent with osteomyelitis. Small
focus of marrow edema is seen in the head of the proximal phalanx of
the great toe. The patient is status post amputation of the second
toe, third toe and distal third metatarsal as seen on prior exams.
Moderate first MTP osteoarthritis is identified.

Mixed signal intensity fluid most consistent with abscess is seen in
soft tissues of the pad of the great toe deep to the distal phalanx.
The collection measures approximately 0.8 cm long by 1 cm
craniocaudal by 0.4 cm transverse. Diffuse subcutaneous edema is
present about the foot. There appears to be bandaging at the head of
the proximal phalanx of the great toe on the medial side. Intrinsic
musculature of the foot is atrophied.
IMPRESSION: Findings consistent with osteomyelitis throughout the distal phalanx
of the great toe with an abscess in the plantar soft tissues at the
level of the distal phalanx.

Mild marrow edema in the head of the proximal phalanx of the great
toe may be reactive or could reflect small focus of osteomyelitis.

Soft tissue edema throughout the dorsum of the foot consistent with
cellulitis or dependent change.

First MTP osteoarthritis.

Status post amputation of the second and third toes and distal third
metatarsal.

## 2017-10-07 ENCOUNTER — Other Ambulatory Visit: Payer: Self-pay | Admitting: *Deleted

## 2017-10-07 MED ORDER — AMLODIPINE BESYLATE 10 MG PO TABS
10.0000 mg | ORAL_TABLET | Freq: Every day | ORAL | 1 refills | Status: DC
Start: 2017-10-07 — End: 2018-03-22

## 2017-11-02 ENCOUNTER — Telehealth: Payer: Self-pay | Admitting: Cardiovascular Disease

## 2017-11-02 NOTE — Telephone Encounter (Signed)
Returned call to pt he states that he indeed has been out of medication for 2 weeks and has not called for samples. He states that he has been trying to "deal with it" with the insurance and has not gotten anywhere except it is not covered. Informed pt that samples are at the front desk w/co-pay card since he has commercial insurance, pt notified to re-start ASAP. Verbalizes understanding

## 2017-11-02 NOTE — Telephone Encounter (Signed)
New Message     Pt c/o medication issue:  1. Name of Medication: Brillinta  2. How are you currently taking this medication (dosage and times per day)? 2 tabs every day  3. Are you having a reaction (difficulty breathing--STAT)? no  4. What is your medication issue? He has been out of it for 2 weeks, he can not afford to pick up the prescription

## 2017-11-21 ENCOUNTER — Emergency Department (HOSPITAL_COMMUNITY): Payer: BLUE CROSS/BLUE SHIELD

## 2017-11-21 ENCOUNTER — Emergency Department (HOSPITAL_COMMUNITY)
Admission: EM | Admit: 2017-11-21 | Discharge: 2017-11-22 | Disposition: A | Discharge status: Home / Self Care | Payer: BLUE CROSS/BLUE SHIELD | Attending: Emergency Medicine | Admitting: Emergency Medicine

## 2017-11-21 ENCOUNTER — Other Ambulatory Visit: Payer: Self-pay

## 2017-11-21 ENCOUNTER — Encounter (HOSPITAL_COMMUNITY): Payer: Self-pay

## 2017-11-21 DIAGNOSIS — Z794 Long term (current) use of insulin: Secondary | ICD-10-CM | POA: Diagnosis not present

## 2017-11-21 DIAGNOSIS — R109 Unspecified abdominal pain: Secondary | ICD-10-CM

## 2017-11-21 DIAGNOSIS — R1011 Right upper quadrant pain: Secondary | ICD-10-CM | POA: Diagnosis not present

## 2017-11-21 DIAGNOSIS — N183 Chronic kidney disease, stage 3 (moderate): Secondary | ICD-10-CM | POA: Diagnosis not present

## 2017-11-21 DIAGNOSIS — E119 Type 2 diabetes mellitus without complications: Secondary | ICD-10-CM | POA: Insufficient documentation

## 2017-11-21 DIAGNOSIS — I130 Hypertensive heart and chronic kidney disease with heart failure and stage 1 through stage 4 chronic kidney disease, or unspecified chronic kidney disease: Secondary | ICD-10-CM | POA: Insufficient documentation

## 2017-11-21 DIAGNOSIS — I5042 Chronic combined systolic (congestive) and diastolic (congestive) heart failure: Secondary | ICD-10-CM | POA: Diagnosis not present

## 2017-11-21 DIAGNOSIS — D860 Sarcoidosis of lung: Secondary | ICD-10-CM | POA: Diagnosis not present

## 2017-11-21 DIAGNOSIS — R1013 Epigastric pain: Secondary | ICD-10-CM | POA: Diagnosis not present

## 2017-11-21 DIAGNOSIS — Z7982 Long term (current) use of aspirin: Secondary | ICD-10-CM | POA: Insufficient documentation

## 2017-11-21 DIAGNOSIS — Z79899 Other long term (current) drug therapy: Secondary | ICD-10-CM | POA: Diagnosis not present

## 2017-11-21 LAB — I-STAT TROPONIN, ED: Troponin i, poc: 0.07 ng/mL (ref 0.00–0.08)

## 2017-11-21 LAB — URINALYSIS, ROUTINE W REFLEX MICROSCOPIC
Bilirubin Urine: NEGATIVE
Glucose, UA: >=500 mg/dL — AB
Ketones, ur: NEGATIVE mg/dL
Leukocytes, UA: NEGATIVE
Nitrite: NEGATIVE
Protein, ur: >=300 mg/dL — AB
Specific Gravity, Urine: 1.018 (ref 1.005–1.030)
pH: 7 (ref 5.0–8.0)

## 2017-11-21 LAB — CBC
HCT: 44 % (ref 39.0–52.0)
Hemoglobin: 15 g/dL (ref 13.0–17.0)
MCH: 28 pg (ref 26.0–34.0)
MCHC: 34.1 g/dL (ref 30.0–36.0)
MCV: 82.2 fL (ref 78.0–100.0)
Platelets: 194 10*3/uL (ref 150–400)
RBC: 5.35 MIL/uL (ref 4.22–5.81)
RDW: 14.4 % (ref 11.5–15.5)
WBC: 11.9 10*3/uL — ABNORMAL HIGH (ref 4.0–10.5)

## 2017-11-21 LAB — BASIC METABOLIC PANEL
Anion gap: 12 (ref 5–15)
BUN: 20 mg/dL (ref 6–20)
CO2: 23 mmol/L (ref 22–32)
Calcium: 9.5 mg/dL (ref 8.9–10.3)
Chloride: 100 mmol/L — ABNORMAL LOW (ref 101–111)
Creatinine, Ser: 1.47 mg/dL — ABNORMAL HIGH (ref 0.61–1.24)
GFR calc Af Amer: 59 mL/min — ABNORMAL LOW (ref >60)
GFR calc non Af Amer: 50 mL/min — ABNORMAL LOW (ref >60)
Glucose, Bld: 308 mg/dL — ABNORMAL HIGH (ref 65–99)
Potassium: 4.2 mmol/L (ref 3.5–5.1)
Sodium: 135 mmol/L (ref 135–145)

## 2017-11-21 LAB — LIPASE, BLOOD: Lipase: 23 U/L (ref 11–51)

## 2017-11-21 MED ORDER — ONDANSETRON HCL 4 MG/2ML IJ SOLN
4.0000 mg | Freq: Once | INTRAMUSCULAR | Status: AC
  Administered 2017-11-21: 4 mg via INTRAVENOUS
  Filled 2017-11-21: qty 2

## 2017-11-21 MED ORDER — HYDROMORPHONE HCL 1 MG/ML IJ SOLN
1.0000 mg | Freq: Once | INTRAMUSCULAR | Status: AC
  Administered 2017-11-21: 1 mg via INTRAVENOUS
  Filled 2017-11-21: qty 1

## 2017-11-21 MED ORDER — HALOPERIDOL LACTATE 5 MG/ML IJ SOLN
2.0000 mg | Freq: Once | INTRAMUSCULAR | Status: AC
  Administered 2017-11-21: 2 mg via INTRAVENOUS
  Filled 2017-11-21: qty 1

## 2017-11-21 MED ORDER — SODIUM CHLORIDE 0.9 % IV BOLUS (SEPSIS): 1000.0000 mL | Freq: Once | INTRAVENOUS | Status: DC

## 2017-11-21 MED ORDER — METOCLOPRAMIDE HCL 10 MG PO TABS: 10.0000 mg | ORAL_TABLET | Freq: Three times a day (TID) | ORAL | 0 refills | Status: DC | PRN

## 2017-11-21 MED ORDER — SODIUM CHLORIDE 0.9 % IV SOLN
Freq: Once | INTRAVENOUS | Status: AC
  Administered 2017-11-21: 22:00:00 via INTRAVENOUS

## 2017-11-21 MED ORDER — FAMOTIDINE IN NACL 20-0.9 MG/50ML-% IV SOLN
20.0000 mg | Freq: Once | INTRAVENOUS | Status: AC
  Administered 2017-11-21: 20 mg via INTRAVENOUS
  Filled 2017-11-21: qty 50

## 2017-11-21 NOTE — ED Triage Notes (Signed)
Pt endorses right rib pain, generalized abd pain and right flank pain. Denies urinary sx. Has hx of heart problems. Appears very uncomfortable and shob.

## 2017-11-21 NOTE — ED Provider Notes (Signed)
[facility] EMERGENCY DEPARTMENT Provider Note   CSN: [id number] Arrival date & time: 11/21/17  1824     History   Chief Complaint Chief Complaint  Patient presents with  . Abdominal Pain  . Chest Pain    HPI Joshua [name] is a 61 y.o. male.  HPI   61 year old male with abdominal pain.  Onset last night.  Pain is in the epigastrium to right upper quadrant/right flank.  Initially intermittent.  Since early this morning has been constant.  No appreciable exacerbating relieving factors.  No fevers or chills.  Nausea.  No vomiting.  He denies any respiratory complaints to me.  No unusual leg pain or swelling.  No urinary complaints.  Past Medical History:  Diagnosis Date  . Anemia   . Chronic combined systolic and diastolic CHF (congestive heart failure) ([place])    a. 07/2015 Echo: EF 45-50%, mod LVH, mid apical/antsept AK, Gr 1 DD.  [name] CKD (chronic kidney disease), stage III ([place])   . Coronary artery disease    a. 2012 s/p MI/PCI: s/p DES to mid/dist RCA and overlapping DES to LAD;  b. 07/2015 Cath: LAD 10% ISR, D1 40(jailed), LCX 58m, OM2 30, OM3 30, RCA 28m ISR, 10d ISR; c. 07/2016 NSTEMI/PCI: LAD 85ost/p/m (3.5x20 Synergy DES overlapping prior stent), D1 40, LCX 81m, OM2 95 (2.75x16 Synergy DES), OM3 30, RCA 25m, 10d.  . Depression   . Diabetic gastroparesis ([place])   . GERD (gastroesophageal reflux disease)   . Gout   . Gout   . Heart murmur   . Hyperlipemia   . Hypertensive heart disease   . Ischemic cardiomyopathy    a. 07/2015 Echo: EF 45-50%, mod LVH, mid-apicalanteroseptal AK, Gr1 DD.  . Morbid obesity ([place])   . Myocardial infarction ([place]) 2012  . OSA on CPAP   . Osteomyelitis ([place])    a. 07/2016 MTP joint of R great toe s/p transmetatarsal amputation.  . Polyneuropathy in diabetes(357.2)   . Pulmonary sarcoidosis ([place])    "problems w/it years ago; no problems anymore" (07/03/2014)  . Rectal bleeding   . Type II diabetes mellitus [facility])      Patient Active Problem List   Diagnosis Date Noted  . Special screening for malignant neoplasms, colon 08/22/2017  . Acute renal failure superimposed on stage 3 chronic kidney disease ([place]) 05/19/2017  . Essential hypertension   . Status post transmetatarsal amputation of foot, right ([place]) 11/08/2016  . Diabetic polyneuropathy associated with type 2 diabetes mellitus ([place]) 11/08/2016  . Pure hypercholesterolemia   . AKI (acute kidney injury) ([place])   . Acute blood loss anemia   . DM type 2 with diabetic peripheral neuropathy ([place])   . Physical debility 08/03/2016  . Chronic osteomyelitis of right foot ([place])   . Cellulitis and abscess of leg, except foot   . Penetrating wound of right foot   . CKD (chronic kidney disease) stage 3, GFR 30-59 ml/min ([facility])   . Unstable angina pectoris ([place]) 07/18/2016  . Chronic combined systolic and diastolic congestive heart failure ([place])   . Chest pain with moderate risk for cardiac etiology 06/24/2014  . CAD S/P multiple PCIs 06/24/2014  . Sleep apnea- on C-pap 06/24/2014  . Morbid obesity due to excess calories ([place]) 11/10/2013  . Hyperglycemia 10/29/2013  . Hyperlipidemia 01/12/2011  . Benign essential HTN 01/12/2011  . GERD 01/12/2011    Past Surgical History:  Procedure Laterality Date  . AMPUTATION Right 07/24/2016   Procedure: TRANSMETATARSAL  FOOT AMPUTATION;  Surgeon: Nadara Mustard, MD;  Location: [facility] OR;  Service: Orthopedics;  Laterality: Right;  . BREAST LUMPECTOMY Right ~ 2000   "benign"  . CARDIAC CATHETERIZATION N/A 07/30/2015   Procedure: Left Heart Cath and Coronary Angiography;  Surgeon: Kathleene Hazel, MD;  Location: [facility] INVASIVE CV LAB;  Service: Cardiovascular;  Laterality: N/A;  . CARDIAC CATHETERIZATION N/A 07/19/2016   Procedure: Left Heart Cath and Coronary Angiography;  Surgeon: Lyn Records, MD;  Location: [facility] INVASIVE CV LAB;  Service: Cardiovascular;  Laterality: N/A;  . CARDIAC CATHETERIZATION N/A 07/29/2016    Procedure: Coronary Stent Intervention;  Surgeon: Lyn Records, MD;  Location: [facility] INVASIVE CV LAB;  Service: Cardiovascular;  Laterality: N/A;  . CARDIAC CATHETERIZATION N/A 07/29/2016   Procedure: Intravascular Ultrasound/IVUS;  Surgeon: Lyn Records, MD;  Location: [facility] INVASIVE CV LAB;  Service: Cardiovascular;  Laterality: N/A;  . CATARACT EXTRACTION W/ INTRAOCULAR LENS  IMPLANT, BILATERAL Bilateral 2014-2015  . COLONOSCOPY WITH PROPOFOL N/A 08/22/2017   Procedure: COLONOSCOPY WITH PROPOFOL;  Surgeon: Charlott Rakes, MD;  Location: [facility] ENDOSCOPY;  Service: Endoscopy;  Laterality: N/A;  . CORONARY ANGIOPLASTY WITH STENT PLACEMENT  10/31/2011   30 % MID ATRIOVENTRICULAR GROOVE CX STENOSIS. 90 % MID RCA.PTA TO LAD/STENTING OF TANDEM 60-70%. LAD STENOSIS 99% REDUCED TO 0%.3.0 X PROMUS DES POSTDILATED TO 3.25MM.  [name] CORONARY ANGIOPLASTY WITH STENT PLACEMENT  07/03/2014   "2"  . I&D EXTREMITY Right 10/30/2013   Procedure: IRRIGATION AND DEBRIDEMENT RIGHT SMALL FINGER POSSIBLE SECONDARY WOUND CLOSURE;  Surgeon: Marlowe Shores, MD;  Location: [facility] ORS;  Service: Orthopedics;  Laterality: Right;  Mina Marble is available after 4pm  . I&D EXTREMITY Right 11/01/2013   Procedure:  IRRIGATION AND DEBRIDEMENT RIGHT  PROXIMAL PHALANGEAL LEVEL AMPUTATION;  Surgeon: Marlowe Shores, MD;  Location: [facility] ORS;  Service: Orthopedics;  Laterality: Right;  . INCISION AND DRAINAGE Right 10/28/2013   Procedure: INCISION AND DRAINAGE Right small finger;  Surgeon: Marlowe Shores, MD;  Location: [facility] ORS;  Service: Orthopedics;  Laterality: Right;  . LEFT HEART CATH Left 11/03/2011   Procedure: LEFT HEART CATH;  Surgeon: Lennette Bihari, MD;  Location: [facility] CATH LAB;  Service: Cardiovascular;  Laterality: Left;  . LEFT HEART CATHETERIZATION WITH CORONARY ANGIOGRAM N/A 07/03/2014   Procedure: LEFT HEART CATHETERIZATION WITH CORONARY ANGIOGRAM;  Surgeon: Lesleigh Noe, MD;  Location: [facility] CATH LAB;  Service:  Cardiovascular;  Laterality: N/A;  . PERCUTANEOUS CORONARY STENT INTERVENTION (PCI-S) Left 11/03/2011   Procedure: PERCUTANEOUS CORONARY STENT INTERVENTION (PCI-S);  Surgeon: Lennette Bihari, MD;  Location: [facility] CATH LAB;  Service: Cardiovascular;  Laterality: Left;  . TOE AMPUTATION Right ~ 2010   "2nd toe"  . TOE AMPUTATION Right 2012   "3rd toe"       Home Medications    Prior to Admission medications   Medication Sig Start Date End Date Taking? Authorizing Provider  acetaminophen (TYLENOL) 325 MG tablet Take 2 tablets (650 mg total) by mouth every 4 (four) hours as needed for headache or mild pain. 08/03/16   Leone Brand, NP  allopurinol (ZYLOPRIM) 300 MG tablet Take 1 tablet (300 mg total) by mouth daily. 08/13/16   Love, Evlyn Kanner, PA-C  amLODipine (NORVASC) 10 MG tablet Take 1 tablet (10 mg total) by mouth daily. 10/07/17   Lennette Bihari, MD  aspirin EC 81 MG tablet Take 81 mg by mouth daily.    [provider]  [facility]  MG TABS tablet TAKE 1 TABLET BY MOUTH TWICE A DAY 03/07/17   [name], MD  hydrALAZINE (APRESOLINE) 100 MG tablet Take 1 tablet (100 mg total) by mouth 2 (two) times daily. 04/06/17   [name], MD  insulin NPH Human (HUMULIN N,NOVOLIN N) 100 UNIT/ML injection Inject 30 Units into the skin. Every morning and every evening    [provider]  insulin regular (NOVOLIN R,HUMULIN R) 100 units/mL injection Inject into the skin. Sliding scale---takes before he eats    [provider]  isosorbide mononitrate (IMDUR) 120 MG 24 hr tablet Take 1 tablet (120 mg total) by mouth daily. 07/29/17   [name], MD  metoprolol succinate (TOPROL XL) 100 MG 24 hr tablet Take 1 & 1/2  Tablets daily 07/29/17   [name], MD  nitroGLYCERIN (NITROSTAT) 0.4 MG SL tablet Place 1 tablet (0.4 mg total) under the tongue every 5 (five) minutes as needed for chest pain. 10/19/13   [name], MD  pantoprazole (PROTONIX) 40 MG tablet Take 1  tablet (40 mg total) by mouth daily. 08/13/16   Love, Ivan Anchors, PA-C  rosuvastatin (CRESTOR) 40 MG tablet Take 1 tablet (40 mg total) by mouth daily. 03/23/17   [name], MD    Family History Family History  Problem Relation Age of Onset  . Heart attack Maternal Aunt   . Diabetes Maternal Grandmother   . Hypertension Maternal Grandmother     Social History Social History   Tobacco Use  . Smoking status: Never Smoker  . Smokeless tobacco: Never Used  Substance Use Topics  . Alcohol use: Yes    Comment: social   . Drug use: No     Allergies   Chlorthalidone   Review of Systems Review of Systems  All systems reviewed and negative, other than as noted in HPI.  Physical Exam Updated Vital Signs BP (!) 174/86 (BP Location: Right Arm)   Pulse 72   Temp 98.9 F (37.2 C) (Oral)   Resp 20   Ht $R'5\' 9"'vF$  (1.753 m)   Wt 136.1 kg (300 lb)   SpO2 100%   BMI 44.30 kg/m   Physical Exam  Constitutional: He appears well-developed and well-nourished.  Laying in bed.  Appears uncomfortable.  Changing position frequently.  HENT:  Head: Normocephalic and atraumatic.  Eyes: Conjunctivae are normal. Right eye exhibits no discharge. Left eye exhibits no discharge.  Neck: Neck supple.  Cardiovascular: Normal rate, regular rhythm and normal heart sounds. Exam reveals no gallop and no friction rub.  No murmur heard. Pulmonary/Chest: Effort normal and breath sounds normal. No respiratory distress.  Abdominal: Soft. He exhibits no distension. There is no tenderness.  Mild diffuse abdominal tenderness, focally worse in epigastrium.  No rebound or guarding.  No distention.  Musculoskeletal: He exhibits no edema or tenderness.  Neurological: He is alert.  Skin: Skin is warm and dry.  Psychiatric: He has a normal mood and affect. His behavior is normal. Thought content normal.  Nursing note and vitals reviewed.    ED Treatments / Results  Labs (all labs ordered are listed, but only  abnormal results are displayed) Labs Reviewed  BASIC METABOLIC PANEL - Abnormal; Notable for the following components:      Result Value   Chloride 100 (*)    Glucose, Bld 308 (*)    Creatinine, Ser 1.47 (*)    GFR calc non Af Amer 50 (*)    GFR calc  Af Amer 59 (*)    All other components within normal limits  CBC - Abnormal; Notable for the following components:   WBC 11.9 (*)    All other components within normal limits  URINALYSIS, ROUTINE W REFLEX MICROSCOPIC - Abnormal; Notable for the following components:   Glucose, UA >=500 (*)    Hgb urine dipstick SMALL (*)    Protein, ur >=300 (*)    Bacteria, UA RARE (*)    Squamous Epithelial / LPF 0-5 (*)    All other components within normal limits  LIPASE, BLOOD  I-STAT TROPONIN, ED    EKG  EKG Interpretation None       Radiology Dg Chest 2 View  Result Date: 11/21/2017 CLINICAL DATA:  Right rib pain.  Generalized abdominal pain. EXAM: CHEST  2 VIEW COMPARISON:  Chest radiograph 10/11/2016. FINDINGS: Stable cardiomegaly. Unchanged diffuse bilateral interstitial pulmonary opacities. No large area of pulmonary consolidation. No pleural effusion or pneumothorax. Thoracic spine degenerative changes. IMPRESSION: No acute cardiopulmonary process.  Chronic interstitial opacities. Electronically Signed   By: Lovey Newcomer M.D.   On: 11/21/2017 19:06    Procedures Procedures (including critical care time)  Medications Ordered in ED Medications  HYDROmorphone (DILAUDID) injection 1 mg (not administered)  ondansetron (ZOFRAN) injection 4 mg (not administered)  0.9 %  sodium chloride infusion (not administered)  famotidine (PEPCID) IVPB 20 mg premix (not administered)     Initial Impression / Assessment and Plan / ED Course  I have reviewed the triage vital signs and the nursing notes.  Pertinent labs & imaging results that were available during my care of the patient were reviewed by me and considered in my medical decision  making (see chart for details).     Gastroparesis? Gastritis?  He now reports feeling definitely better.  He almost appears like a new person.  Abstain from alcohol.  Diet discussed as well as return precautions.   Final Clinical Impressions(s) / ED Diagnoses   Final diagnoses:  Abdominal pain, unspecified abdominal location    ED Discharge Orders        Ordered    metoCLOPramide (REGLAN) 10 MG tablet  Every 8 hours PRN     11/21/17 2311       Virgel Manifold, MD 11/29/17 1017

## 2017-11-29 ENCOUNTER — Encounter: Payer: Self-pay | Admitting: Cardiovascular Disease

## 2017-11-29 ENCOUNTER — Ambulatory Visit: Payer: BLUE CROSS/BLUE SHIELD | Admitting: Cardiovascular Disease

## 2017-11-29 VITALS — BP 145/85 | HR 81 | Ht 69.0 in | Wt 289.2 lb

## 2017-11-29 DIAGNOSIS — G4733 Obstructive sleep apnea (adult) (pediatric): Secondary | ICD-10-CM

## 2017-11-29 DIAGNOSIS — I2510 Atherosclerotic heart disease of native coronary artery without angina pectoris: Secondary | ICD-10-CM | POA: Diagnosis not present

## 2017-11-29 DIAGNOSIS — N183 Chronic kidney disease, stage 3 unspecified: Secondary | ICD-10-CM

## 2017-11-29 DIAGNOSIS — I110 Hypertensive heart disease with heart failure: Secondary | ICD-10-CM

## 2017-11-29 DIAGNOSIS — Z9861 Coronary angioplasty status: Secondary | ICD-10-CM

## 2017-11-29 DIAGNOSIS — Z79899 Other long term (current) drug therapy: Secondary | ICD-10-CM

## 2017-11-29 DIAGNOSIS — E7800 Pure hypercholesterolemia, unspecified: Secondary | ICD-10-CM

## 2017-11-29 DIAGNOSIS — E1142 Type 2 diabetes mellitus with diabetic polyneuropathy: Secondary | ICD-10-CM

## 2017-11-29 DIAGNOSIS — I5042 Chronic combined systolic (congestive) and diastolic (congestive) heart failure: Secondary | ICD-10-CM

## 2017-11-29 DIAGNOSIS — I10 Essential (primary) hypertension: Secondary | ICD-10-CM | POA: Diagnosis not present

## 2017-11-29 MED ORDER — HYDROCHLOROTHIAZIDE 12.5 MG PO CAPS: 12.5000 mg | ORAL_CAPSULE | Freq: Every day | ORAL | 3 refills | Status: DC

## 2017-11-29 MED ORDER — NITROGLYCERIN 0.4 MG SL SUBL: 0.4000 mg | SUBLINGUAL_TABLET | SUBLINGUAL | 3 refills | Status: DC | PRN

## 2017-11-29 NOTE — Patient Instructions (Signed)
Medication Instructions:  START HCTZ 12.5 mg daily  Labwork: Please return for FASTING labs (CMET, CBC, Lipid, TSH)  Follow-Up: Your physician wants you to follow-up in: 6 months with Dr. Claiborne Billings. You will receive a reminder letter in the mail two months in advance. If you don't receive a letter, please call our office to schedule the follow-up appointment.   Any Other Special Instructions Will Be Listed Below (If Applicable).     If you need a refill on your cardiac medications before your next appointment, please call your pharmacy.

## 2017-11-29 NOTE — Progress Notes (Signed)
Patient ID: Joshua [name], male   DOB: [birth year], 61 y.o.   MRN: [id number]      HPI: Joshua [name] is a 61 y.o. male who presents to the office for a 4 month  follow-up cardiology evaluation.  [name] suffered an acute coronary syndrome on 10/31/2011 and presented with ECG findings of anterolateral ST segment elevation. Cardiac catheterization revealed a 99% mid LAD stenosis and proximal to this there was a 60-70% stenosis after the first diagonal vessel. He also had 90-95% mid RCA stenosis and 30% circumflex stenosis. He underwent initial acute intervention to his LAD and a 3.0x32 mm Promus DES stent was inserted. 3 days later he underwent successful staged PCI to his 95% RCA stenosis and a 3.0x18 mm Resolute DES stent was inserted and post dilated 3.5 mm.   Additional problems include at least a >30 year history of hypertension, a 25 year history of diabetes mellitus, obesity, as well as hyperlipidemia.  He was admitted to the hospital on 07/29/2015 with complaints of shortness of breath similar to his initial symptomatology.  He had mild renal insufficiency, was hydrated and underwent cardiac catheterization by Dr. Julianne [name] in 07/30/2015.  His LAD stents were patent.  There was mild 40% first agonal stenosis, 60% circumflex stenosis, and his RCA stents had a 50% intimal narrowing in its mid stent without significant narrowing in the distal stent.  An echo Doppler study on 07/31/2015 showed an EF of 45-50% with moderate left ventricular hypertrophy and grade 1 diastolic dysfunction.   He was seen by Joshua [name] on 08/08/2015 for initial posthospital evaluation.  Since that time, he has had some vague symptoms of GERD.  He is not on any proton pump inhibitor.  He gets shortness of breath with minimal activity including brushing his teeth and taking a shower.  He has not been routinely exercising.  He has obstructive sleep apnea and uses CPAP therapy.  He denies residual snoring.  He works at  the [employer] in the night shift.  At times he feels that some of his food may get stuck when swallowing but this is not consistent.    When I saw him in September 2016, I started him on Protonix for significant GERD symptoms which has improved.  We discussed weight loss and exercise. Laboratory showed worsening renal function with a creatinine of 1.84, significant glucose elevation at 669 with probable factitious hyponatremia at 129, due to high glucose levels.  His hemoglobin A1c was 14.2.  I referred him to Dr. Michiel [name] for endocrinologic evaluation. I also discontinued his spironolactone and added hydralazine 25 mg twice a day for more optimal blood pressure control in light of his renal insufficiency.  We discussed the importance of weight loss.  When I saw him in the office in March 2017, he has been in a cast and boot in his right leg due to prior infection of his right toe. He did undergo hyperbaric chamber therapy.  He tells me he was initially told of having borderline osteomyelitis. Four weeks ago his cultures were negative.  He subsequently required right metatarsal amputation for osteomyelitis in August 2017.  In August he was admitted to [facility] with unstable angina and ruled in for a non-STEM>  Repeat catheterization showed new eccentric proximal LAD stenosis, not involving the previously placed overlapping mid LAD stents, eccentric 75% mid left circumflex stenosis between OM1 and OM 2, and progression of OM1 stenosis to 95%.  He ultimately underwent stenting  to his LAD and circumflex marginal vessel with Synergy stents.  He was readmitted in October 2017 with recurrent chest pain which was felt to be atypical and musculoskeletal.  A 2 day protocol nuclear stress test showed an EF of 39% without ischemia.  He was seen in office follow-up by Joshua [name], Joshua [name] on 10/22/2016.   When I saw him in December 2017, he denied any recurrent episodes of chest pain.  His sleep was poor and  he felt tired.  Over 10 years ago he was evaluated for sleep apnea but had only used CPAP for 6 months.  I referred him for a sleep study which was done on 01/07/2017.  Revealed severe obstructive sleep apnea with an AHI of 41.6/h.  He had severe oxygen desaturation to a nadir of 77%.  He had increased periodic limb movements with an index of 25.15.  On the diagnostic portion of the study there was absence of REM sleep. During the  CPAP titration there was evidence for REM rebound, such that he slept 32.6% in REM sleep.    When I last saw him, he was on CPAP for 6 weeks and felt significantly improved. I obtained a download of his CPAP unit today from 03/07/2017 through 04/05/2017.  He is 100% compliant with usage stays and usage greater than 4 hours and is averaging 8 hours and 4 minutes of sleep per night.  He is set at a 17 cm pressure his AHI is excellent at 1.5.  He does have a moderate leak.  He uses a fullface mask.  He denies any chest pain.  He denies palpitations.  He had noticed recently that his blood pressure is elevated.    When I saw him in June 2018, he was able to get a new job.  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  His work is from 7 PM until 7 AM.  Typically he would go home and get to sleep between 8:39 AM.  Depending upon if he had to pick his son up, he would either sleep until 5 PM or if he had to get his son up would have to wake up in the early afternoon.  He denies exertionally precipitated chest pain but has noticed some sharp fleeting atypical sensations.  There was confusion with his medications and he had run out of his metoprolol succinate which he was taking 150 mg daily and nitrates   A new download was obtained in the office today from July 15 through 07/25/2017.  He did have machine malfunction contribute to reduce compliance.  However, at a 17 cm pressure.  AHI was excellent at 0.4. An Epworth Sleepiness Scale score was recalculated  and this endorsed at  7.  Since I last saw him, he states that he has felt fairly well.  Was using CPAP more consistently and a download from September 25 through 10/05/2017 showed 83%.  Usage stays.  However, usage greater than 4 hours was only 67%.  He was averaging 5 hours and 36 minutes.  At 17 cm set pressure.  AHI was excellent at 0.8.  He did have a moderate mask leak.  He had not use CPAP for the last 3 weeks due to significant sinus congestion.  He works from 7 PM to 7 AM.  He typically goes to bed at 8 AM until 4 PM.  He has noticed mild leg swelling.  He also has had issues with diabetes.  He was seen in  the emergency room on 11/21/2017 with right-sided flank pain.  When he presented with GI symptoms and vomiting.  He denies any anginal symptomatology.  He has not been successful with weight loss.  He presents for evaluation.  Past Medical History:  Diagnosis Date  . Anemia   . Chronic combined systolic and diastolic CHF (congestive heart failure) ([place])    a. 07/2015 Echo: EF 45-50%, mod LVH, mid apical/antsept AK, Gr 1 DD.  [name] CKD (chronic kidney disease), stage III ([place])   . Coronary artery disease    a. 2012 s/p MI/PCI: s/p DES to mid/dist RCA and overlapping DES to LAD;  b. 07/2015 Cath: LAD 10% ISR, D1 40(jailed), LCX 64m OM2 30, OM3 30, RCA 571mSR, 10d ISR; c. 07/2016 NSTEMI/PCI: LAD 85ost/p/m (3.5x20 Synergy DES overlapping prior stent), D1 40, LCX 759mM2 95 (2.75x16 Synergy DES), OM3 30, RCA 5m19md.  . Depression   . Diabetic gastroparesis (HCC)[place]. GERD (gastroesophageal reflux disease)   . Gout   . Gout   . Heart murmur   . Hyperlipemia   . Hypertensive heart disease   . Ischemic cardiomyopathy    a. 07/2015 Echo: EF 45-50%, mod LVH, mid-apicalanteroseptal AK, Gr1 DD.  . Morbid obesity (HCC)[place]. Myocardial infarction (HCC)[place]  . OSA on CPAP   . Osteomyelitis (HCC)[place] a. 07/2016 MTP joint of R great toe s/p transmetatarsal amputation.  . Polyneuropathy in diabetes(357.2)   . Pulmonary  sarcoidosis (HCC)[place] "problems w/it years ago; no problems anymore" (07/03/2014)  . Rectal bleeding   . Type II diabetes mellitus (HCC)[place]  Past Surgical History:  Procedure Laterality Date  . AMPUTATION Right 07/24/2016   Procedure: TRANSMETATARSAL FOOT AMPUTATION;  Surgeon: MarcNewt Minion;  [facility];  Laterality: Right;  . BREAST LUMPECTOMY Right ~ 2000   "benign"  . CARDIAC CATHETERIZATION N/A 07/30/2015   Procedure: Left Heart Cath and Coronary Angiography;  Surgeon: ChriBurnell Blanks;  [facility];  Service: Cardiovascular;  Laterality: N/A;  . CARDIAC CATHETERIZATION N/A 07/19/2016   Procedure: Left Heart Cath and Coronary Angiography;  Surgeon: HenrBelva Crome;  [facility];  Service: Cardiovascular;  Laterality: N/A;  . CARDIAC CATHETERIZATION N/A 07/29/2016   Procedure: Coronary Stent Intervention;  Surgeon: HenrBelva Crome;  [facility];  Service: Cardiovascular;  Laterality: N/A;  . CARDIAC CATHETERIZATION N/A 07/29/2016   Procedure: Intravascular Ultrasound/IVUS;  Surgeon: HenrBelva Crome;  [facility];  Service: Cardiovascular;  Laterality: N/A;  . CATARACT EXTRACTION W/ INTRAOCULAR LENS  IMPLANT, BILATERAL Bilateral 2014-2015  . COLONOSCOPY WITH PROPOFOL N/A 08/22/2017   Procedure: COLONOSCOPY WITH PROPOFOL;  Surgeon: SchoWilford Corner;  [facility]oscopy;  Laterality: N/A;  . CORONARY ANGIOPLASTY WITH STENT PLACEMENT  10/31/2011   30 % MID ATRIOVENTRICULAR GROOVE CX STENOSIS. 90 % MID RCA.PTA TO LAD/STENTING OF TANDEM 60-70%. LAD STENOSIS 99% REDUCED TO 0%.3.0 X 32MM PROMUS DES POSTDILATED TO 3.25MM.  . COMarland KitchenONARY ANGIOPLASTY WITH STENT PLACEMENT  07/03/2014   "2"  . I&D EXTREMITY Right 10/30/2013   Procedure: IRRIGATION AND DEBRIDEMENT RIGHT SMALL FINGER POSSIBLE SECONDARY WOUND CLOSURE;  Surgeon: MattSchuyler Amor;  Location: [facility];   Service: Orthopedics;  Laterality: Right;  WeinBurney Gauzeavailable after 4pm  . I&D EXTREMITY Right 11/01/2013   Procedure:  IRRIGATION AND DEBRIDEMENT RIGHT  PROXIMAL PHALANGEAL LEVEL AMPUTATION;  Surgeon: [name], MD;  Location: [facility];  Service: Orthopedics;  Laterality: Right;  . INCISION AND DRAINAGE Right 10/28/2013   Procedure: INCISION AND DRAINAGE Right small finger;  Surgeon: [name], MD;  Location: [facility];  Service: Orthopedics;  Laterality: Right;  . LEFT HEART CATH Left 11/03/2011   Procedure: LEFT HEART CATH;  Surgeon: [name], MD;  Location[facility] C[facility] LAB;  Service: Cardiovascular;  Laterality: Left;  . LEFT HEART CATHETERIZATION WITH CORONARY ANGIOGRAM N/A 07/03/2014   Procedure: LEFT HEART CATHETERIZATION WITH CORONARY ANGIOGRAM;  Surgeon: [name], MD;  Location: [facility] CATH LAB;  Service: Cardiovascular;  Laterality: N/A;  . PERCUTANEOUS CORONARY STENT INTERVENTION (PCI-S) Left 11/03/2011   Procedure: PERCUTANEOUS CORONARY STENT INTERVENTION (PCI-S);  Surgeon: [name], MD;  Location: [facility] CATH LAB;  Service: Cardiovascular;  Laterality: Left;  . TOE AMPUTATION Right ~ 2010   "2nd toe"  . TOE AMPUTATION Right 2012   "3rd toe"    Allergies  Allergen Reactions  . Chlorthalidone Other (See Comments)    Causes dehydration, kidneys shut down    Current Outpatient Medications  Medication Sig Dispense Refill  . acetaminophen (TYLENOL) 325 MG tablet Take 2 tablets (650 mg total) by mouth every 4 (four) hours as needed for headache or mild pain.    [name] acetaminophen (TYLENOL) 650 MG CR tablet Take 1,300 mg by mouth daily as needed for pain.    [name] allopurinol (ZYLOPRIM) 100 MG tablet Take 100 mg by mouth 3 (three) times daily.    [name] amLODipine (NORVASC) 10 MG tablet Take 1 tablet (10 mg total) by mouth daily. (Patient taking differently: Take 10 mg by mouth every evening. ) 90 tablet 1  . aspirin EC 81 MG tablet Take 81 mg by mouth at bedtime.     [name]  BRILINTA 90 MG TABS tablet TAKE 1 TABLET BY MOUTH TWICE A DAY (Patient taking differently: TAKE 1 TABLET (90 MG) BY MOUTH TWICE A DAY) 60 tablet 11  . Capsaicin-Menthol (SALONPAS GEL EX) Apply 1 application topically 2 (two) times daily as needed (shoulder pain).    . hydrALAZINE (APRESOLINE) 50 MG tablet Take 50 mg by mouth 2 (two) times daily.  11  . insulin NPH Human (HUMULIN N,NOVOLIN N) 100 UNIT/ML injection Inject 22 Units into the skin 2 (two) times daily.     . insulin regular (NOVOLIN R,HUMULIN R) 100 units/mL injection Inject 10-12 Units into the skin daily with supper.     . isosorbide mononitrate (IMDUR) 120 MG 24 hr tablet Take 1 tablet (120 mg total) by mouth daily. (Patient taking differently: Take 120 mg by mouth every evening. ) 90 tablet 2  . metoCLOPramide (REGLAN) 10 MG tablet Take 1 tablet (10 mg total) by mouth every 8 (eight) hours as needed for nausea. 20 tablet 0  . metoprolol succinate (TOPROL XL) 100 MG 24 hr tablet Take 1 & 1/2  Tablets daily (Patient taking differently: Take 150 mg by mouth every evening. ) 135 tablet 2  . nitroGLYCERIN (NITROSTAT) 0.4 MG SL tablet Place 1 tablet (0.4 mg total) under the tongue every 5 (five) minutes as needed for chest pain. 25 tablet 3  . pantoprazole (PROTONIX) 40 MG tablet Take 1 tablet (40 mg total) by mouth daily. (Patient taking differently: Take 40 mg by mouth every evening. ) 30 tablet 0  . rosuvastatin (CRESTOR) 40 MG tablet Take 1 tablet (  40 mg total) by mouth daily. (Patient taking differently: Take 40 mg by mouth every evening. ) 30 tablet 11  . hydrochlorothiazide (MICROZIDE) 12.5 MG capsule Take 1 capsule (12.5 mg total) by mouth daily. 90 capsule 3   No current facility-administered medications for this visit.     Social History   Socioeconomic History  . Marital status: Married    Spouse name: Not on file  . Number of children: Not on file  . Years of education: Not on file  . Highest education level: Not on file    Social Needs  . Financial resource strain: Not on file  . Food insecurity - worry: Not on file  . Food insecurity - inability: Not on file  . Transportation needs - medical: Not on file  . Transportation needs - non-medical: Not on file  Occupational History  . Not on file  Tobacco Use  . Smoking status: Never Smoker  . Smokeless tobacco: Never Used  Substance and Sexual Activity  . Alcohol use: Yes    Comment: social   . Drug use: No  . Sexual activity: Yes  Other Topics Concern  . Not on file  Social History Narrative  . Not on file    Family History  Problem Relation Age of Onset  . Heart attack Maternal Aunt   . Diabetes Maternal Grandmother   . Hypertension Maternal Grandmother     ROS General: Negative; No fevers, chills, or night sweats;  HEENT: Negative; No changes in vision or hearing, sinus congestion, difficulty swallowing Pulmonary: Negative; No cough, wheezing, shortness of breath, hemoptysis Cardiovascular: Negative; No chest pain, presyncope, syncope, palpitations GI: Negative; No nausea, vomiting, diarrhea, or abdominal pain GU: Negative; No dysuria, hematuria, or difficulty voiding Musculoskeletal: Negative; no myalgias, joint pain, or weakness Hematologic/Oncology: Negative; no easy bruising, bleeding Endocrine: Negative; no heat/cold intolerance; no diabetes Neuro: Negative; no changes in balance, headaches Skin: Negative; No rashes or skin lesions Psychiatric: Negative; No behavioral problems, depression Sleep: Positive for obstructive sleep apnea on CPAP therapy;  snoring, daytime sleepiness, hypersomnolence, no bruxism, restless legs, hypnogognic hallucinations, no cataplexy Other comprehensive 14 point system review is negative.   PE BP (!) 145/85   Pulse 81   Ht 5' 9" (1.753 m)   Wt 289 lb 3.2 oz (131.2 kg)   SpO2 97%   BMI 42.71 kg/m    Repeat blood pressure by me was 162/90  Wt Readings from Last 3 Encounters:  11/29/17 289 lb 3.2  oz (131.2 kg)  11/21/17 300 lb (136.1 kg)  08/22/17 300 lb (136.1 kg)   General: Alert, oriented, no distress.  Skin: normal turgor, no rashes, warm and dry HEENT: Normocephalic, atraumatic. Pupils equal round and reactive to light; sclera anicteric; extraocular muscles intact;  Nose without nasal septal hypertrophy Mouth/Parynx benign; Mallinpatti scale 3/4 Neck: Thick neck; No JVD, no carotid bruits; normal carotid upstroke Lungs: clear to ausculatation and percussion; no wheezing or rales Chest wall: without tenderness to palpitation Heart: PMI not displaced, RRR, s1 s2 normal, 1/6 systolic murmur, no diastolic murmur, no rubs, gallops, thrills, or heaves Abdomen: Moderate central adiposity soft, nontender; no hepatosplenomehaly, BS+; abdominal aorta nontender and not dilated by palpation. Back: no CVA tenderness Pulses 2+ Musculoskeletal: full range of motion, normal strength, no joint deformities Extremities: Right metatarsal amputation; trace ankle edema bilaterally, right greater than left; no clubbing cyanosis, Homan's sign negative  Neurologic: grossly nonfocal; Cranial nerves grossly wnl Psychologic: Normal mood and affect   ECG  from 11/21/2017 ER evaluation (independently read by me): Normal sinus rhythm with mild sinus arrhythmia.  QS V1 V2.  Probable early repolarization changes inferolaterally.  Normal intervals.  ECG (independently read by me): Normal sinus rhythm at 75 bpm.  First-degree AV block with a PR interval of 202 ms.  No significant ST-T changes.  April 2018 ECG (independently read by me): Normal sinus rhythm at 78 bpm.  First degree AV block with a PR interval of 206 msec.  QS complex V1 through V4  02/17/2017 ECG (independently read by me): Normal sinus rhythm at 75 bpm.  No ectopy.  Q wave in lead 3.  December 2017 ECG (independently read by me): normal sinus rhythm at 68 bpm.  First-degree AV block with a PR interval 212 ms.  No significant ST  changes.  March 2017 ECG (independently read by me):   Normal sinus rhythm at 80 bpm..  No ectopy.  Borderline left atrial enlargement.  November 2016 ECG (independently read by me):  Normal sinus rhythm at 77 bpm.  Poor R wave progression anteriorly.  QTc interval 425 ms. Borderline first-degree AV block.  ECG (independently read by me): Sinus rhythm at 85 bpm.  Early repolarization changes anterolaterally.  Prior 2014 ECG: Normal sinus rhythm at 90 beats per minute. Poor progression V1 through V4. Normal intervals.  LABS: BMP Latest Ref Rng & Units 11/21/2017 05/23/2017 05/22/2017  Glucose 65 - 99 mg/dL 308(H) 241(H) 252(H)  BUN 6 - 20 mg/dL 20 48(H) 51(H)  Creatinine 0.61 - 1.24 mg/dL 1.47(H) 3.70(H) 4.16(H)  Sodium 135 - 145 mmol/L 135 138 136  Potassium 3.5 - 5.1 mmol/L 4.2 3.6 3.2(L)  Chloride 101 - 111 mmol/L 100(L) 108 104  CO2 22 - 32 mmol/L _0 Calcium 8.9 - 10.3 mg/dL 9.5 8.9 8.6(L)   Hepatic Function Latest Ref Rng & Units 05/19/2017 11/17/2016 08/04/2016  Total Protein 6.5 - 8.1 g/dL 7.4 6.6 7.3  Albumin 3.5 - 5.0 g/dL 3.5 3.4(L) 2.3(L)  AST 15 - 41 U/L _1 ALT 17 - 63 U/L 23 16 16(L)  Alk Phosphatase 38 - 126 U/L 115 140(H) 114  Total Bilirubin 0.3 - 1.2 mg/dL 0.6 0.3 0.3   CBC Latest Ref Rng & Units 11/21/2017 05/23/2017 05/22/2017  WBC 4.0 - 10.5 K/uL 11.9(H) 6.7 7.4  Hemoglobin 13.0 - 17.0 g/dL 15.0 13.2 13.1  Hematocrit 39.0 - 52.0 % 44.0 39.4 39.1  Platelets 150 - 400 K/uL 194 194 187   Lab Results  Component Value Date   MCV 82.2 11/21/2017   MCV 81.6 05/23/2017   MCV 82.5 05/22/2017   Lab Results  Component Value Date   TSH 2.15 11/17/2016   Lab Results  Component Value Date   HGBA1C 11.8 (H) 05/19/2017   Lipid Panel     Component Value Date/Time   CHOL 158 11/17/2016 0921   TRIG 116 11/17/2016 0921   HDL 74 11/17/2016 0921   CHOLHDL 2.1 11/17/2016 0921   VLDL 23 11/17/2016 0921   LDLCALC 61 11/17/2016 0921      RADIOLOGY: Dg  Chest 2 View  10/28/2013   CLINICAL DATA:  Shortness of breath  EXAM: CHEST  2 VIEW  COMPARISON:  10/31/2011  FINDINGS: Mild cardiomegaly, stable from prior. Kerley A and B lines noted. No significant effusion. No asymmetric airspace opacity. No acute osseous findings.  IMPRESSION: Mild interstitial pulmonary edema.   Electronically Signed   By: Jorje Guild M.D.   On:  10/28/2013 23:01   Dg Hand Complete Right  10/28/2013   CLINICAL DATA:  Little finger laceration with pain  EXAM: RIGHT HAND - COMPLETE 3+ VIEW  COMPARISON:  None.  FINDINGS: There is extensive soft tissue swelling of the 5th digit, extending into the dorsal hand. No radiodense foreign body. No osseous erosion or asymmetric joint narrowing to suggest osseous/joint infection. No acute fracture or malalignment.  Remote ulnar and radial styloid process fractures.  Degenerative interphalangeal joint narrowing throughout the digits.  IMPRESSION: No evidence of osseous infection.  No radiodense foreign body.   Electronically Signed   By: Jorje Guild M.D.   On: 10/28/2013 23:04    IMPRESSION:  1. CAD S/P multiple PCIs   2. Essential hypertension   3. Pure hypercholesterolemia   4. Chronic combined systolic and diastolic congestive heart failure ([place])   5. CKD (chronic kidney disease) stage 3, GFR 30-59 ml/min (HCC)   6. Medication management   7. OSA (obstructive sleep apnea)   8. Hypertensive heart disease with heart failure ([place])   9. Morbid obesity ([place])   10. DM type 2 with diabetic peripheral neuropathy [facility])     ASSESSMENT AND PLAN: Mr. Tashi Band is a 61 year old African-American male with morbid obesity who suffered an acute coronary syndrome/ anterolateral ST segment elevation myocardial infarction in November 2012. At that time he underwent stenting to his subtotaled LAD stenosis and 3 days later underwent staged intervention to a high grade mid right coronary artery stenosis. A nuclear perfusion study in January  2013 was low risk and demonstrated asmall area of anteroseptal scar towards the apex without associated ischemia.  Due to osteomyelitis he underwent right metatarsal amputation in August 2017.  Subsequently, he developed chest pain and ruled in with a NSTEMI.  Repeat catheterization demonstrated progressive proximal LAD disease and circumflex marginal disease and underwent two-vessel PCI.  Socially, he had developed some atypical chest pain.  He has had issues with hypertension leading to medication titration.  His blood pressure today was 132/82 and repeat by me, but was 145/85.  When taken initially.  He has had issues with some ankle edema.  I'm adding HCTZ 12.5 mg daily to his regimen which consists of amlodipine 10 mg, hydralazine 50 mg twice a day and Toprol-XL 150 mg daily.  He is back on isosorbide at 120 mg daily.  He did recently been evaluated for right-sided flank pain and his emergency room evaluation.  I reviewed these records.  He has obstructive sleep apnea.  I discussed with him the importance of more optimal compliance.  Due to sinus congestion he had not use CPAP with a past 3 months.  I discussed the importance of optimal sleep duration at 7-8 hours and discussed with him that the preponderance of from sleep occurs in the second half of night in the importance of continuation of treatment for the entire duration of sleep.  His AHI is excellent at 0.8 on 17 cm water pressure.  I recommended improved mask cleansing of facial oils with the possibility of needing a new mask cushion due to his leak.  We discussed the importance of weight loss.  He is diabetic and initially had a hemoglobin A1c at 11.8 in June 2018.  He tells me recently.  This was checked by Dr. Carlis Abbott and he did improve to 9.  He continues to be on Crestor 40 mg for hyperlipidemia.  I will see him in 6 months for reevaluation.  Time spent: 25 minutes  [name], MD, [facility]  12/05/2017 5:33 PM

## 2017-12-05 ENCOUNTER — Encounter: Payer: Self-pay | Admitting: Cardiovascular Disease

## 2018-03-14 ENCOUNTER — Inpatient Hospital Stay (HOSPITAL_COMMUNITY)
Admission: EM | Admit: 2018-03-14 | Discharge: 2018-03-22 | DRG: 637 | Disposition: A | Discharge status: Home / Self Care | Payer: Medicaid Other | Attending: Internal Medicine | Admitting: Internal Medicine

## 2018-03-14 ENCOUNTER — Emergency Department (HOSPITAL_COMMUNITY): Payer: Medicaid Other

## 2018-03-14 ENCOUNTER — Encounter (HOSPITAL_COMMUNITY): Payer: Self-pay

## 2018-03-14 ENCOUNTER — Other Ambulatory Visit: Payer: Self-pay

## 2018-03-14 DIAGNOSIS — E1110 Type 2 diabetes mellitus with ketoacidosis without coma: Secondary | ICD-10-CM | POA: Diagnosis present

## 2018-03-14 DIAGNOSIS — E1143 Type 2 diabetes mellitus with diabetic autonomic (poly)neuropathy: Secondary | ICD-10-CM | POA: Diagnosis present

## 2018-03-14 DIAGNOSIS — Z9861 Coronary angioplasty status: Secondary | ICD-10-CM

## 2018-03-14 DIAGNOSIS — Z9842 Cataract extraction status, left eye: Secondary | ICD-10-CM

## 2018-03-14 DIAGNOSIS — E1122 Type 2 diabetes mellitus with diabetic chronic kidney disease: Secondary | ICD-10-CM | POA: Diagnosis present

## 2018-03-14 DIAGNOSIS — Z7902 Long term (current) use of antithrombotics/antiplatelets: Secondary | ICD-10-CM

## 2018-03-14 DIAGNOSIS — Z89431 Acquired absence of right foot: Secondary | ICD-10-CM

## 2018-03-14 DIAGNOSIS — N183 Chronic kidney disease, stage 3 unspecified: Secondary | ICD-10-CM | POA: Diagnosis present

## 2018-03-14 DIAGNOSIS — R4182 Altered mental status, unspecified: Secondary | ICD-10-CM

## 2018-03-14 DIAGNOSIS — E876 Hypokalemia: Secondary | ICD-10-CM | POA: Diagnosis present

## 2018-03-14 DIAGNOSIS — A419 Sepsis, unspecified organism: Secondary | ICD-10-CM | POA: Diagnosis present

## 2018-03-14 DIAGNOSIS — E870 Hyperosmolality and hypernatremia: Secondary | ICD-10-CM | POA: Diagnosis not present

## 2018-03-14 DIAGNOSIS — R338 Other retention of urine: Secondary | ICD-10-CM | POA: Diagnosis present

## 2018-03-14 DIAGNOSIS — Z8249 Family history of ischemic heart disease and other diseases of the circulatory system: Secondary | ICD-10-CM

## 2018-03-14 DIAGNOSIS — E43 Unspecified severe protein-calorie malnutrition: Secondary | ICD-10-CM | POA: Diagnosis present

## 2018-03-14 DIAGNOSIS — G4733 Obstructive sleep apnea (adult) (pediatric): Secondary | ICD-10-CM | POA: Diagnosis present

## 2018-03-14 DIAGNOSIS — T383X6A Underdosing of insulin and oral hypoglycemic [antidiabetic] drugs, initial encounter: Secondary | ICD-10-CM | POA: Diagnosis present

## 2018-03-14 DIAGNOSIS — Z9841 Cataract extraction status, right eye: Secondary | ICD-10-CM

## 2018-03-14 DIAGNOSIS — K5900 Constipation, unspecified: Secondary | ICD-10-CM | POA: Diagnosis present

## 2018-03-14 DIAGNOSIS — Z598 Other problems related to housing and economic circumstances: Secondary | ICD-10-CM

## 2018-03-14 DIAGNOSIS — Z86010 Personal history of colonic polyps: Secondary | ICD-10-CM

## 2018-03-14 DIAGNOSIS — I10 Essential (primary) hypertension: Secondary | ICD-10-CM | POA: Diagnosis present

## 2018-03-14 DIAGNOSIS — Z833 Family history of diabetes mellitus: Secondary | ICD-10-CM

## 2018-03-14 DIAGNOSIS — Z89021 Acquired absence of right finger(s): Secondary | ICD-10-CM

## 2018-03-14 DIAGNOSIS — R680 Hypothermia, not associated with low environmental temperature: Secondary | ICD-10-CM | POA: Diagnosis present

## 2018-03-14 DIAGNOSIS — Y92009 Unspecified place in unspecified non-institutional (private) residence as the place of occurrence of the external cause: Secondary | ICD-10-CM

## 2018-03-14 DIAGNOSIS — K222 Esophageal obstruction: Secondary | ICD-10-CM | POA: Diagnosis present

## 2018-03-14 DIAGNOSIS — I252 Old myocardial infarction: Secondary | ICD-10-CM

## 2018-03-14 DIAGNOSIS — E1142 Type 2 diabetes mellitus with diabetic polyneuropathy: Secondary | ICD-10-CM | POA: Diagnosis present

## 2018-03-14 DIAGNOSIS — K3184 Gastroparesis: Secondary | ICD-10-CM | POA: Diagnosis present

## 2018-03-14 DIAGNOSIS — Z794 Long term (current) use of insulin: Secondary | ICD-10-CM

## 2018-03-14 DIAGNOSIS — Z91120 Patient's intentional underdosing of medication regimen due to financial hardship: Secondary | ICD-10-CM

## 2018-03-14 DIAGNOSIS — R000 Tachycardia, unspecified: Secondary | ICD-10-CM | POA: Diagnosis present

## 2018-03-14 DIAGNOSIS — K269 Duodenal ulcer, unspecified as acute or chronic, without hemorrhage or perforation: Secondary | ICD-10-CM | POA: Diagnosis present

## 2018-03-14 DIAGNOSIS — K449 Diaphragmatic hernia without obstruction or gangrene: Secondary | ICD-10-CM | POA: Diagnosis present

## 2018-03-14 DIAGNOSIS — E860 Dehydration: Secondary | ICD-10-CM | POA: Diagnosis present

## 2018-03-14 DIAGNOSIS — N179 Acute kidney failure, unspecified: Secondary | ICD-10-CM

## 2018-03-14 DIAGNOSIS — R34 Anuria and oliguria: Secondary | ICD-10-CM | POA: Diagnosis present

## 2018-03-14 DIAGNOSIS — Z6841 Body Mass Index (BMI) 40.0 and over, adult: Secondary | ICD-10-CM

## 2018-03-14 DIAGNOSIS — K219 Gastro-esophageal reflux disease without esophagitis: Secondary | ICD-10-CM | POA: Diagnosis present

## 2018-03-14 DIAGNOSIS — Z560 Unemployment, unspecified: Secondary | ICD-10-CM

## 2018-03-14 DIAGNOSIS — N1832 Chronic kidney disease, stage 3b: Secondary | ICD-10-CM | POA: Diagnosis present

## 2018-03-14 DIAGNOSIS — I2510 Atherosclerotic heart disease of native coronary artery without angina pectoris: Secondary | ICD-10-CM

## 2018-03-14 DIAGNOSIS — D860 Sarcoidosis of lung: Secondary | ICD-10-CM | POA: Diagnosis present

## 2018-03-14 DIAGNOSIS — R1310 Dysphagia, unspecified: Secondary | ICD-10-CM

## 2018-03-14 DIAGNOSIS — I5042 Chronic combined systolic (congestive) and diastolic (congestive) heart failure: Secondary | ICD-10-CM | POA: Diagnosis present

## 2018-03-14 DIAGNOSIS — K2210 Ulcer of esophagus without bleeding: Secondary | ICD-10-CM | POA: Diagnosis present

## 2018-03-14 DIAGNOSIS — I447 Left bundle-branch block, unspecified: Secondary | ICD-10-CM | POA: Diagnosis present

## 2018-03-14 DIAGNOSIS — Z955 Presence of coronary angioplasty implant and graft: Secondary | ICD-10-CM

## 2018-03-14 DIAGNOSIS — Z961 Presence of intraocular lens: Secondary | ICD-10-CM | POA: Diagnosis present

## 2018-03-14 DIAGNOSIS — R739 Hyperglycemia, unspecified: Secondary | ICD-10-CM

## 2018-03-14 DIAGNOSIS — M109 Gout, unspecified: Secondary | ICD-10-CM | POA: Diagnosis present

## 2018-03-14 DIAGNOSIS — I5032 Chronic diastolic (congestive) heart failure: Secondary | ICD-10-CM | POA: Diagnosis present

## 2018-03-14 DIAGNOSIS — Z9989 Dependence on other enabling machines and devices: Secondary | ICD-10-CM

## 2018-03-14 DIAGNOSIS — T68XXXA Hypothermia, initial encounter: Secondary | ICD-10-CM

## 2018-03-14 DIAGNOSIS — G9341 Metabolic encephalopathy: Secondary | ICD-10-CM | POA: Diagnosis present

## 2018-03-14 DIAGNOSIS — E785 Hyperlipidemia, unspecified: Secondary | ICD-10-CM | POA: Diagnosis present

## 2018-03-14 DIAGNOSIS — I130 Hypertensive heart and chronic kidney disease with heart failure and stage 1 through stage 4 chronic kidney disease, or unspecified chronic kidney disease: Secondary | ICD-10-CM | POA: Diagnosis present

## 2018-03-14 DIAGNOSIS — E861 Hypovolemia: Secondary | ICD-10-CM | POA: Diagnosis not present

## 2018-03-14 DIAGNOSIS — Z888 Allergy status to other drugs, medicaments and biological substances status: Secondary | ICD-10-CM

## 2018-03-14 DIAGNOSIS — K259 Gastric ulcer, unspecified as acute or chronic, without hemorrhage or perforation: Secondary | ICD-10-CM | POA: Diagnosis present

## 2018-03-14 DIAGNOSIS — F1010 Alcohol abuse, uncomplicated: Secondary | ICD-10-CM | POA: Diagnosis present

## 2018-03-14 DIAGNOSIS — Z7982 Long term (current) use of aspirin: Secondary | ICD-10-CM

## 2018-03-14 DIAGNOSIS — Z79899 Other long term (current) drug therapy: Secondary | ICD-10-CM

## 2018-03-14 DIAGNOSIS — T39016A Underdosing of aspirin, initial encounter: Secondary | ICD-10-CM | POA: Diagnosis present

## 2018-03-14 DIAGNOSIS — T45526A Underdosing of antithrombotic drugs, initial encounter: Secondary | ICD-10-CM | POA: Diagnosis present

## 2018-03-14 DIAGNOSIS — I255 Ischemic cardiomyopathy: Secondary | ICD-10-CM | POA: Diagnosis present

## 2018-03-14 DIAGNOSIS — I152 Hypertension secondary to endocrine disorders: Secondary | ICD-10-CM | POA: Diagnosis present

## 2018-03-14 HISTORY — DX: Type 2 diabetes mellitus with ketoacidosis without coma: E11.10

## 2018-03-14 LAB — I-STAT CG4 LACTIC ACID, ED
Lactic Acid, Venous: 4.55 mmol/L (ref 0.5–1.9)
Lactic Acid, Venous: 3.23 mmol/L (ref 0.5–1.9)

## 2018-03-14 LAB — CBC WITH DIFFERENTIAL/PLATELET
Band Neutrophils: 0 %
Basophils Absolute: 0 10*3/uL (ref 0.0–0.1)
Basophils Relative: 0 %
Blasts: 0 %
Eosinophils Absolute: 0 10*3/uL (ref 0.0–0.7)
Eosinophils Relative: 0 %
HCT: 52 % (ref 39.0–52.0)
Hemoglobin: 15.9 g/dL (ref 13.0–17.0)
Lymphocytes Relative: 11 %
Lymphs Abs: 1.8 10*3/uL (ref 0.7–4.0)
MCH: 29.4 pg (ref 26.0–34.0)
MCHC: 30.6 g/dL (ref 30.0–36.0)
MCV: 96.3 fL (ref 78.0–100.0)
Metamyelocytes Relative: 0 %
Monocytes Absolute: 2 10*3/uL — ABNORMAL HIGH (ref 0.1–1.0)
Monocytes Relative: 12 %
Myelocytes: 0 %
Neutro Abs: 12.6 10*3/uL — ABNORMAL HIGH (ref 1.7–7.7)
Neutrophils Relative %: 77 %
Other: 0 %
Platelets: 191 10*3/uL (ref 150–400)
Promyelocytes Absolute: 0 %
RBC: 5.4 MIL/uL (ref 4.22–5.81)
RDW: 14.6 % (ref 11.5–15.5)
WBC: 16.4 10*3/uL — ABNORMAL HIGH (ref 4.0–10.5)
nRBC: 0 /100 WBC

## 2018-03-14 LAB — URINALYSIS, ROUTINE W REFLEX MICROSCOPIC
Bacteria, UA: NONE SEEN
Bilirubin Urine: NEGATIVE
Glucose, UA: >=500 mg/dL — AB
Ketones, ur: 5 mg/dL — AB
Leukocytes, UA: NEGATIVE
Nitrite: NEGATIVE
Protein, ur: 100 mg/dL — AB
RBC / HPF: NONE SEEN RBC/hpf (ref 0–5)
Specific Gravity, Urine: 1.018 (ref 1.005–1.030)
Squamous Epithelial / LPF: NONE SEEN
pH: 5 (ref 5.0–8.0)

## 2018-03-14 LAB — COMPREHENSIVE METABOLIC PANEL
ALT: 17 U/L (ref 17–63)
AST: 21 U/L (ref 15–41)
Albumin: 2.8 g/dL — ABNORMAL LOW (ref 3.5–5.0)
Alkaline Phosphatase: 123 U/L (ref 38–126)
BUN: 148 mg/dL — ABNORMAL HIGH (ref 6–20)
CO2: <7 mmol/L — ABNORMAL LOW (ref 22–32)
Calcium: 8.3 mg/dL — ABNORMAL LOW (ref 8.9–10.3)
Chloride: 87 mmol/L — ABNORMAL LOW (ref 101–111)
Creatinine, Ser: 5.25 mg/dL — ABNORMAL HIGH (ref 0.61–1.24)
GFR calc Af Amer: 13 mL/min — ABNORMAL LOW (ref >60)
GFR calc non Af Amer: 11 mL/min — ABNORMAL LOW (ref >60)
Glucose, Bld: 1241 mg/dL (ref 65–99)
Potassium: 4.9 mmol/L (ref 3.5–5.1)
Sodium: 132 mmol/L — ABNORMAL LOW (ref 135–145)
Total Bilirubin: 1.9 mg/dL — ABNORMAL HIGH (ref 0.3–1.2)
Total Protein: 5.9 g/dL — ABNORMAL LOW (ref 6.5–8.1)

## 2018-03-14 LAB — I-STAT CHEM 8, ED
BUN: 118 mg/dL — ABNORMAL HIGH (ref 6–20)
Calcium, Ion: 0.89 mmol/L — CL (ref 1.15–1.40)
Chloride: 96 mmol/L — ABNORMAL LOW (ref 101–111)
Creatinine, Ser: 4.4 mg/dL — ABNORMAL HIGH (ref 0.61–1.24)
Glucose, Bld: >700 mg/dL (ref 65–99)
HCT: 50 % (ref 39.0–52.0)
Hemoglobin: 17 g/dL (ref 13.0–17.0)
Potassium: 4.8 mmol/L (ref 3.5–5.1)
Sodium: 130 mmol/L — ABNORMAL LOW (ref 135–145)
TCO2: 8 mmol/L — ABNORMAL LOW (ref 22–32)

## 2018-03-14 LAB — I-STAT ARTERIAL BLOOD GAS, ED
Acid-base deficit: 22 mmol/L — ABNORMAL HIGH (ref 0.0–2.0)
Bicarbonate: 5.9 mmol/L — ABNORMAL LOW (ref 20.0–28.0)
O2 Saturation: 83 %
Patient temperature: 88.3
TCO2: 6 mmol/L — ABNORMAL LOW (ref 22–32)
pCO2 arterial: <15 mmHg — CL (ref 32.0–48.0)
pH, Arterial: 7.185 — CL (ref 7.350–7.450)
pO2, Arterial: 42 mmHg — ABNORMAL LOW (ref 83.0–108.0)

## 2018-03-14 LAB — AMMONIA: Ammonia: 32 umol/L (ref 9–35)

## 2018-03-14 LAB — CK: Total CK: 620 U/L — ABNORMAL HIGH (ref 49–397)

## 2018-03-14 LAB — I-STAT TROPONIN, ED: Troponin i, poc: 0.08 ng/mL (ref 0.00–0.08)

## 2018-03-14 LAB — CBG MONITORING, ED
Glucose-Capillary: >600 mg/dL (ref 65–99)
Glucose-Capillary: >600 mg/dL (ref 65–99)
Glucose-Capillary: >600 mg/dL (ref 65–99)

## 2018-03-14 LAB — LIPASE, BLOOD: Lipase: 176 U/L — ABNORMAL HIGH (ref 11–51)

## 2018-03-14 MED ORDER — VANCOMYCIN HCL IN DEXTROSE 1-5 GM/200ML-% IV SOLN: 1000.0000 mg | Freq: Once | INTRAVENOUS | Status: DC

## 2018-03-14 MED ORDER — DEXTROSE-NACL 5-0.45 % IV SOLN: INTRAVENOUS | Status: DC

## 2018-03-14 MED ORDER — SODIUM CHLORIDE 0.9 % IV BOLUS: 1000.0000 mL | Freq: Once | INTRAVENOUS | Status: DC

## 2018-03-14 MED ORDER — INSULIN REGULAR BOLUS VIA INFUSION
0.0000 [IU] | Freq: Three times a day (TID) | INTRAVENOUS | Status: DC
  Filled 2018-03-14: qty 10

## 2018-03-14 MED ORDER — SODIUM CHLORIDE 0.9 % IV SOLN
INTRAVENOUS | Status: DC
  Administered 2018-03-14: 5.4 [IU]/h via INTRAVENOUS
  Filled 2018-03-14: qty 1

## 2018-03-14 MED ORDER — SODIUM CHLORIDE 0.9 % IV BOLUS (SEPSIS)
1000.0000 mL | Freq: Once | INTRAVENOUS | Status: AC
  Administered 2018-03-14: 1000 mL via INTRAVENOUS

## 2018-03-14 MED ORDER — DEXTROSE 50 % IV SOLN: 25.0000 mL | INTRAVENOUS | Status: DC | PRN

## 2018-03-14 MED ORDER — VANCOMYCIN HCL 10 G IV SOLR
2000.0000 mg | Freq: Once | INTRAVENOUS | Status: AC
  Administered 2018-03-14: 2000 mg via INTRAVENOUS
  Filled 2018-03-14: qty 2000

## 2018-03-14 MED ORDER — SODIUM CHLORIDE 0.9 % IV SOLN
INTRAVENOUS | Status: DC
  Administered 2018-03-14: 23:00:00 via INTRAVENOUS

## 2018-03-14 MED ORDER — INSULIN ASPART 100 UNIT/ML ~~LOC~~ SOLN
10.0000 [IU] | Freq: Once | SUBCUTANEOUS | Status: AC
  Administered 2018-03-14: 10 [IU] via INTRAVENOUS
  Filled 2018-03-14: qty 1

## 2018-03-14 MED ORDER — PIPERACILLIN-TAZOBACTAM 3.375 G IVPB 30 MIN
3.3750 g | Freq: Once | INTRAVENOUS | Status: AC
  Administered 2018-03-14: 3.375 g via INTRAVENOUS
  Filled 2018-03-14: qty 50

## 2018-03-14 MED ORDER — SODIUM CHLORIDE 0.9 % IV BOLUS (SEPSIS)
1000.0000 mL | Freq: Once | INTRAVENOUS | Status: AC
Start: 2018-03-14 — End: 2018-03-14
  Administered 2018-03-14: 1000 mL via INTRAVENOUS

## 2018-03-14 NOTE — ED Notes (Signed)
Nurse drawing labs. 

## 2018-03-14 NOTE — ED Notes (Signed)
Pt a difficult stick for cultures upon speaking with MD. MD would like for the antibiotics to be started before cultures.

## 2018-03-14 NOTE — Progress Notes (Signed)
Pharmacy Antibiotic Note  Joshua [name] is a 62 y.o. male admitted on 03/14/2018 with hyperglycemia/ sepsis.  Pharmacy has been consulted for vancomycin and Zosyn dosing. WBC 16., Temp 96, LA  4.55, AKI CrCl ~ 20-25 mL/min  Plan: Vancomycin $RemoveBeforeDE'2000mg'vRGtmNGblMWfTlc$  IV once then $RemoveB'1500mg'wpLnvSog$  IV every 48 hours.  Goal trough 15-20 mcg/mL. Zosyn 3.375g IV q8h (4 hour infusion). Monitor clinical progression and LOT  Weight: 285 lb (129.3 kg)  Temp (24hrs), Avg:90.6 F (32.6 C), Min:85.2 F (29.6 C), Max:96 F (35.6 C)  Recent Labs  Lab 03/14/18 2005 03/14/18 2016  WBC 16.4*  --   CREATININE  --  4.40*  LATICACIDVEN  --  4.55*    Estimated Creatinine Clearance: 24.1 mL/min (A) (by C-G formula based on SCr of 4.4 mg/dL (H)).    Allergies  Allergen Reactions  . Chlorthalidone Other (See Comments)    Causes dehydration, kidneys shut down    Thank you for allowing pharmacy to be a part of this patient's care.  Joshua [name] Joshua [name] 03/14/2018 8:34 PM

## 2018-03-14 NOTE — ED Provider Notes (Signed)
[facility] EMERGENCY DEPARTMENT Provider Note   CSN: [id number] Arrival date & time: 03/14/18  1951     History   Chief Complaint Chief Complaint  Patient presents with  . Altered Mental Status  . Hyperglycemia    HPI Joshua [name] is a 62 y.o. male.  HPI Joshua [name] is a 62 y.o. male with history of diabetes, hypertension, ischemic cardiomyopathy, coronary artery disease, diabetes, presents to emergency department with altered mental status.  Wife is providing most of the history.  Patient apparently has had some abdominal pain and nausea and vomiting over the last week.  In the last several days he has stopped eating and taking his medications.  In fact wife states that she believes he has not had his insulin or any of his medications in 1 week.  He has been sleeping in the bathroom, trying to find a cool space.  He has been confused, and today was disoriented so she brought him to the emergency department.  Patient is unable to provide much history but is able to answer some questions.  Past Medical History:  Diagnosis Date  . Anemia   . Chronic combined systolic and diastolic CHF (congestive heart failure) ([place])    a. 07/2015 Echo: EF 45-50%, mod LVH, mid apical/antsept AK, Gr 1 DD.  [name] CKD (chronic kidney disease), stage III ([place])   . Coronary artery disease    a. 2012 s/p MI/PCI: s/p DES to mid/dist RCA and overlapping DES to LAD;  b. 07/2015 Cath: LAD 10% ISR, D1 40(jailed), LCX 49m, OM2 30, OM3 30, RCA 41m ISR, 10d ISR; c. 07/2016 NSTEMI/PCI: LAD 85ost/p/m (3.5x20 Synergy DES overlapping prior stent), D1 40, LCX 47m, OM2 95 (2.75x16 Synergy DES), OM3 30, RCA 74m, 10d.  . Depression   . Diabetic gastroparesis ([place])   . GERD (gastroesophageal reflux disease)   . Gout   . Gout   . Heart murmur   . Hyperlipemia   . Hypertensive heart disease   . Ischemic cardiomyopathy    a. 07/2015 Echo: EF 45-50%, mod LVH, mid-apicalanteroseptal AK, Gr1 DD.  .  Morbid obesity ([place])   . Myocardial infarction ([place]) 2012  . OSA on CPAP   . Osteomyelitis ([place])    a. 07/2016 MTP joint of R great toe s/p transmetatarsal amputation.  . Polyneuropathy in diabetes(357.2)   . Pulmonary sarcoidosis ([place])    "problems w/it years ago; no problems anymore" (07/03/2014)  . Rectal bleeding   . Type II diabetes mellitus [facility])     Patient Active Problem List   Diagnosis Date Noted  . Special screening for malignant neoplasms, colon 08/22/2017  . Acute renal failure superimposed on stage 3 chronic kidney disease ([place]) 05/19/2017  . Essential hypertension   . Status post transmetatarsal amputation of foot, right ([place]) 11/08/2016  . Diabetic polyneuropathy associated with type 2 diabetes mellitus ([place]) 11/08/2016  . Pure hypercholesterolemia   . AKI (acute kidney injury) ([place])   . Acute blood loss anemia   . DM type 2 with diabetic peripheral neuropathy ([place])   . Physical debility 08/03/2016  . Chronic osteomyelitis of right foot ([place])   . Cellulitis and abscess of leg, except foot   . Penetrating wound of right foot   . CKD (chronic kidney disease) stage 3, GFR 30-59 ml/min ([facility])   . Unstable angina pectoris ([place]) 07/18/2016  . Chronic combined systolic and diastolic congestive heart failure ([place])   . Chest pain with moderate risk  for cardiac etiology 06/24/2014  . CAD S/P multiple PCIs 06/24/2014  . Sleep apnea- on C-pap 06/24/2014  . Morbid obesity due to excess calories ([place]) 11/10/2013  . Hyperglycemia 10/29/2013  . Hyperlipidemia 01/12/2011  . Benign essential HTN 01/12/2011  . GERD 01/12/2011    Past Surgical History:  Procedure Laterality Date  . AMPUTATION Right 07/24/2016   Procedure: TRANSMETATARSAL FOOT AMPUTATION;  Surgeon: [name], MD;  Location: [facility];  Service: Orthopedics;  Laterality: Right;  . BREAST LUMPECTOMY Right ~ 2000   "benign"  . CARDIAC CATHETERIZATION N/A 07/30/2015   Procedure: Left Heart Cath and Coronary  Angiography;  Surgeon: Burnell Blanks, MD;  Location: [facility] CV LAB;  Service: Cardiovascular;  Laterality: N/A;  . CARDIAC CATHETERIZATION N/A 07/19/2016   Procedure: Left Heart Cath and Coronary Angiography;  Surgeon: Belva Crome, MD;  Location: [facility];  Service: Cardiovascular;  Laterality: N/A;  . CARDIAC CATHETERIZATION N/A 07/29/2016   Procedure: Coronary Stent Intervention;  Surgeon: Belva Crome, MD;  Location: [facility];  Service: Cardiovascular;  Laterality: N/A;  . CARDIAC CATHETERIZATION N/A 07/29/2016   Procedure: Intravascular Ultrasound/IVUS;  Surgeon: Belva Crome, MD;  Location: [facility];  Service: Cardiovascular;  Laterality: N/A;  . CATARACT EXTRACTION W/ INTRAOCULAR LENS  IMPLANT, BILATERAL Bilateral 2014-2015  . COLONOSCOPY WITH PROPOFOL N/A 08/22/2017   Procedure: COLONOSCOPY WITH PROPOFOL;  Surgeon: [name], MD;  Location: [facility];  Service: Endoscopy;  Laterality: N/A;  . CORONARY ANGIOPLASTY WITH STENT PLACEMENT  10/31/2011   30 % MID ATRIOVENTRICULAR GROOVE CX STENOSIS. 90 % MID RCA.PTA TO LAD/STENTING OF TANDEM 60-70%. LAD STENOSIS 99% REDUCED TO 0%.3.0 X 32MM PROMUS DES POSTDILATED TO 3.25MM.  [name] CORONARY ANGIOPLASTY WITH STENT PLACEMENT  07/03/2014   "2"  . I&D EXTREMITY Right 10/30/2013   Procedure: IRRIGATION AND DEBRIDEMENT RIGHT SMALL FINGER POSSIBLE SECONDARY WOUND CLOSURE;  Surgeon: [name], MD;  Location: [facility];  Service: Orthopedics;  Laterality: Right;  [name] is available after 4pm  . I&D EXTREMITY Right 11/01/2013   Procedure:  IRRIGATION AND DEBRIDEMENT RIGHT  PROXIMAL PHALANGEAL LEVEL AMPUTATION;  Surgeon: [name], MD;  Location: [facility];  Service: Orthopedics;  Laterality: Right;  . INCISION AND DRAINAGE Right 10/28/2013   Procedure: INCISION AND DRAINAGE Right small finger;  Surgeon: [name], MD;  Location: [facility];  Service: Orthopedics;  Laterality: Right;  .  LEFT HEART CATH Left 11/03/2011   Procedure: LEFT HEART CATH;  Surgeon: [name], MD;  [facility];  Service: Cardiovascular;  Laterality: Left;  . LEFT HEART CATHETERIZATION WITH CORONARY ANGIOGRAM N/A 07/03/2014   Procedure: LEFT HEART CATHETERIZATION WITH CORONARY ANGIOGRAM;  Surgeon: [name], MD;  Location: [facility] CATH LAB;  Service: Cardiovascular;  Laterality: N/A;  . PERCUTANEOUS CORONARY STENT INTERVENTION (PCI-S) Left 11/03/2011   Procedure: PERCUTANEOUS CORONARY STENT INTERVENTION (PCI-S);  Surgeon: [name], MD;  Location: [facility] CATH LAB;  Service: Cardiovascular;  Laterality: Left;  . TOE AMPUTATION Right ~ 2010   "2nd toe"  . TOE AMPUTATION Right 2012   "3rd toe"        Home Medications    Prior to Admission medications   Medication Sig Start Date End Date Taking? Authorizing Provider  acetaminophen (TYLENOL) 325 MG tablet Take 2 tablets (650 mg total) by mouth every 4 (four) hours as needed for headache or mild pain. 08/03/16   Isaiah Serge,  NP  acetaminophen (TYLENOL) 650 MG CR tablet Take 1,300 mg by mouth daily as needed for pain.    [provider]  allopurinol (ZYLOPRIM) 100 MG tablet Take 100 mg by mouth 3 (three) times daily.    [provider]  amLODipine (NORVASC) 10 MG tablet Take 1 tablet (10 mg total) by mouth daily. Patient taking differently: Take 10 mg by mouth every evening.  10/07/17   [name], MD  aspirin EC 81 MG tablet Take 81 mg by mouth at bedtime.     [provider]  BRILINTA 90 MG TABS tablet TAKE 1 TABLET BY MOUTH TWICE A DAY Patient taking differently: TAKE 1 TABLET (90 MG) BY MOUTH TWICE A DAY 03/07/17   [name], MD  Capsaicin-Menthol (SALONPAS GEL EX) Apply 1 application topically 2 (two) times daily as needed (shoulder pain).    [provider]  hydrALAZINE (APRESOLINE) 50 MG tablet Take 50 mg by mouth 2 (two) times daily. 09/05/17   [provider]    hydrochlorothiazide (MICROZIDE) 12.5 MG capsule Take 1 capsule (12.5 mg total) by mouth daily. 11/29/17 02/27/18  [name], MD  insulin NPH Human (HUMULIN N,NOVOLIN N) 100 UNIT/ML injection Inject 22 Units into the skin 2 (two) times daily.     [provider]  insulin regular (NOVOLIN R,HUMULIN R) 100 units/mL injection Inject 10-12 Units into the skin daily with supper.     [provider]  isosorbide mononitrate (IMDUR) 120 MG 24 hr tablet Take 1 tablet (120 mg total) by mouth daily. Patient taking differently: Take 120 mg by mouth every evening.  07/29/17   [name], MD  metoCLOPramide (REGLAN) 10 MG tablet Take 1 tablet (10 mg total) by mouth every 8 (eight) hours as needed for nausea. 11/21/17   Virgel Manifold, MD  metoprolol succinate (TOPROL XL) 100 MG 24 hr tablet Take 1 & 1/2  Tablets daily Patient taking differently: Take 150 mg by mouth every evening.  07/29/17   [name], MD  nitroGLYCERIN (NITROSTAT) 0.4 MG SL tablet Place 1 tablet (0.4 mg total) under the tongue every 5 (five) minutes as needed for chest pain. 11/29/17   [name], MD  pantoprazole (PROTONIX) 40 MG tablet Take 1 tablet (40 mg total) by mouth daily. Patient taking differently: Take 40 mg by mouth every evening.  08/13/16   Love, Ivan Anchors, PA-C  rosuvastatin (CRESTOR) 40 MG tablet Take 1 tablet (40 mg total) by mouth daily. Patient taking differently: Take 40 mg by mouth every evening.  03/23/17   [name], MD    Family History Family History  Problem Relation Age of Onset  . Heart attack Maternal Aunt   . Diabetes Maternal Grandmother   . Hypertension Maternal Grandmother     Social History Social History   Tobacco Use  . Smoking status: Never Smoker  . Smokeless tobacco: Never Used  Substance Use Topics  . Alcohol use: Yes    Comment: social   . Drug use: No     Allergies   Chlorthalidone   Review of Systems Review of Systems  Unable to perform  ROS: Mental status change     Physical Exam Updated Vital Signs BP 112/89   Pulse (!) 112   Temp (!) 96 F (35.6 C) (Axillary)   Resp 19   SpO2 99%   Physical Exam  Constitutional: He appears well-developed and well-nourished.  Appears to be somnolent  HENT:  Head: Normocephalic and atraumatic.  Eyes: Conjunctivae are normal.  Pupils constricted, normal extraocular movements  Neck: Normal range of motion. Neck supple.  Cardiovascular: Normal rate, regular rhythm and normal heart sounds.  Pulmonary/Chest: Effort normal. No respiratory distress. He has no wheezes. He has no rales.  Abdominal: Soft. Bowel sounds are normal. He exhibits no distension. There is no tenderness. There is no rebound and no guarding.  Musculoskeletal: He exhibits no edema.  Right foot amputation  Neurological: He is alert. No cranial nerve deficit. Coordination abnormal.  Speech is slurred.  Patient is somnolent, however wakes up when spoken to.  He is oriented to self and place.  He follows all directions, 5 out of 5 and equal grip strength bilaterally.  5 out of 5 and equal bilateral lower extremity strength.  Unable to perform finger to nose   Skin: Skin is warm and dry.  Nursing note and vitals reviewed.    ED Treatments / Results  Labs (all labs ordered are listed, but only abnormal results are displayed) Labs Reviewed  COMPREHENSIVE METABOLIC PANEL - Abnormal; Notable for the following components:      Result Value   Sodium 132 (*)    Chloride 87 (*)    CO2 <7 (*)    Glucose, Bld 1,241 (*)    BUN 148 (*)    Creatinine, Ser 5.25 (*)    Calcium 8.3 (*)    Total Protein 5.9 (*)    Albumin 2.8 (*)    Total Bilirubin 1.9 (*)    GFR calc non Af Amer 11 (*)    GFR calc Af Amer 13 (*)    All other components within normal limits  URINALYSIS, ROUTINE W REFLEX MICROSCOPIC - Abnormal; Notable for the following components:   APPearance HAZY (*)    Glucose, UA >=500 (*)    Hgb urine dipstick  SMALL (*)    Ketones, ur 5 (*)    Protein, ur 100 (*)    All other components within normal limits  CBC WITH DIFFERENTIAL/PLATELET - Abnormal; Notable for the following components:   WBC 16.4 (*)    Neutro Abs 12.6 (*)    Monocytes Absolute 2.0 (*)    All other components within normal limits  LIPASE, BLOOD - Abnormal; Notable for the following components:   Lipase 176 (*)    All other components within normal limits  CK - Abnormal; Notable for the following components:   Total CK 620 (*)    All other components within normal limits  CBG MONITORING, ED - Abnormal; Notable for the following components:   Glucose-Capillary >600 (*)    All other components within normal limits  I-STAT CHEM 8, ED - Abnormal; Notable for the following components:   Sodium 130 (*)    Chloride 96 (*)    BUN 118 (*)    Creatinine, Ser 4.40 (*)    Glucose, Bld >700 (*)    Calcium, Ion 0.89 (*)    TCO2 8 (*)    All other components within normal limits  I-STAT CG4 LACTIC ACID, ED - Abnormal; Notable for the following components:   Lactic Acid, Venous 4.55 (*)    All other components within normal limits  CBG MONITORING, ED - Abnormal; Notable for the following components:   Glucose-Capillary >600 (*)    All other components within normal limits  I-STAT ARTERIAL BLOOD GAS, ED - Abnormal; Notable for the following components:   pH, Arterial 7.185 (*)  pCO2 arterial <15.0 (*)    pO2, Arterial 42.0 (*)    Bicarbonate 5.9 (*)    TCO2 6 (*)    Acid-base deficit 22.0 (*)    All other components within normal limits  I-STAT CG4 LACTIC ACID, ED - Abnormal; Notable for the following components:   Lactic Acid, Venous 3.23 (*)    All other components within normal limits  CBG MONITORING, ED - Abnormal; Notable for the following components:   Glucose-Capillary >600 (*)    All other components within normal limits  CBG MONITORING, ED - Abnormal; Notable for the following components:   Glucose-Capillary >600  (*)    All other components within normal limits  I-STAT ARTERIAL BLOOD GAS, ED - Abnormal; Notable for the following components:   pH, Arterial 7.222 (*)    pCO2 arterial 16.2 (*)    Bicarbonate 7.0 (*)    TCO2 8 (*)    Acid-base deficit 19.0 (*)    All other components within normal limits  CULTURE, BLOOD (ROUTINE X 2)  CULTURE, BLOOD (ROUTINE X 2)  URINE CULTURE  AMMONIA  BASIC METABOLIC PANEL  BASIC METABOLIC PANEL  BASIC METABOLIC PANEL  BASIC METABOLIC PANEL  BASIC METABOLIC PANEL  BASIC METABOLIC PANEL  CBC  TROPONIN I  TROPONIN I  TROPONIN I  HEMOGLOBIN A1C  LACTIC ACID, PLASMA  LACTIC ACID, PLASMA  PROCALCITONIN  I-STAT TROPONIN, ED    EKG EKG Interpretation  Date/Time:  Tuesday March 14 2018 20:20:38 EDT Ventricular Rate:  61 PR Interval:    QRS Duration: 126 QT Interval:  487 QTC Calculation: 491 R Axis:   68 Text Interpretation:  Sinus rhythm Short PR interval Left bundle branch block Confirmed by Quintella Reichert [id number]) on 03/14/2018 9:34:22 PM   Radiology Ct Abdomen Pelvis Wo Contrast  Result Date: 03/15/2018 CLINICAL DATA:  Nausea and vomiting EXAM: CT ABDOMEN AND PELVIS WITHOUT CONTRAST TECHNIQUE: Multidetector CT imaging of the abdomen and pelvis was performed following the standard protocol without IV contrast. COMPARISON:  11/21/2017 FINDINGS: LOWER CHEST: No basilar pulmonary nodules or pleural effusion. No apical pericardial effusion. HEPATOBILIARY: Normal hepatic contours and density. No intra- or extrahepatic biliary dilatation. Normal gallbladder. PANCREAS: Normal parenchymal contours without ductal dilatation. No peripancreatic fluid collection. SPLEEN: Normal. ADRENALS/URINARY TRACT: --Adrenal glands: Normal. --Right kidney/ureter: No hydronephrosis, nephroureterolithiasis, perinephric stranding or solid renal mass. --Left kidney/ureter: No hydronephrosis, nephroureterolithiasis, perinephric stranding or solid renal mass. --Urinary bladder: Normal  for degree of distention STOMACH/BOWEL: --Stomach/Duodenum: No hiatal hernia or other gastric abnormality. Normal duodenal course. --Small bowel: No dilatation or inflammation. --Colon: No focal abnormality. --Appendix: Normal. VASCULAR/LYMPHATIC: Normal course and caliber of the major abdominal vessels. No abdominal or pelvic lymphadenopathy. REPRODUCTIVE: Normal prostate and seminal vesicles. MUSCULOSKELETAL. Multilevel degenerative disc disease and facet arthrosis. No bony spinal canal stenosis. OTHER: None. IMPRESSION: No acute abnormality of the abdomen or pelvis. Electronically Signed   By: Ulyses Jarred M.D.   On: 03/15/2018 00:11   Ct Head Wo Contrast  Result Date: 03/15/2018 CLINICAL DATA:  Altered mental status EXAM: CT HEAD WITHOUT CONTRAST TECHNIQUE: Contiguous axial images were obtained from the base of the skull through the vertex without intravenous contrast. COMPARISON:  12/07/2006 FINDINGS: Brain: No mass lesion, intraparenchymal hemorrhage or extra-axial collection. No evidence of acute cortical infarct. Normal appearance of the brain parenchyma and extra axial spaces for age. Vascular: No hyperdense vessel or unexpected vascular calcification. Skull: Normal visualized skull base, calvarium and extracranial soft tissues. Sinuses/Orbits: No sinus fluid levels  or advanced mucosal thickening. No mastoid effusion. Normal orbits. IMPRESSION: Normal head CT. Electronically Signed   By: Ulyses Jarred M.D.   On: 03/15/2018 00:15   Dg Chest Portable 1 View  Result Date: 03/14/2018 CLINICAL DATA:  Abdominal pain. EXAM: PORTABLE CHEST 1 VIEW COMPARISON:  Chest x-ray dated November 21, 2017. FINDINGS: Stable cardiomegaly. Normal pulmonary vascularity. No focal consolidation, pleural effusion, or pneumothorax. No acute osseous abnormality. IMPRESSION: No active disease. Electronically Signed   By: Titus Dubin M.D.   On: 03/14/2018 21:46    Procedures Procedures (including critical care  time)  CRITICAL CARE Performed by: Jaiden Dinkins Total critical care time: 30 minutes Critical care time was exclusive of separately billable procedures and treating other patients. Critical care was necessary to treat or prevent imminent or life-threatening deterioration. Critical care was time spent personally by me on the following activities: development of treatment plan with patient and/or surrogate as well as nursing, discussions with consultants, evaluation of patient's response to treatment, examination of patient, obtaining history from patient or surrogate, ordering and performing treatments and interventions, ordering and review of laboratory studies, ordering and review of radiographic studies, pulse oximetry and re-evaluation of patient's condition.  Medications Ordered in ED Medications  sodium chloride 0.9 % bolus 1,000 mL (has no administration in time range)  piperacillin-tazobactam (ZOSYN) IVPB 3.375 g (has no administration in time range)  vancomycin (VANCOCIN) IVPB 1000 mg/200 mL premix (has no administration in time range)     Initial Impression / Assessment and Plan / ED Course  I have reviewed the triage vital signs and the nursing notes.  Pertinent labs & imaging results that were available during my care of the patient were reviewed by me and considered in my medical decision making (see chart for details).     Pt is hypothermic, temp of 96, asked for rectal temp, tachycardic, altered, lactic acid 4.55, will start on sepsis protocol. Cultures and antibiotics ordered.   Sepsis - Repeat Assessment  Performed at:    9:39 PM   Vitals     Blood pressure 138/75, pulse (!) 112, temperature (!) 85.2 F (29.6 C), temperature source Rectal, resp. rate (!) 21, weight 129.3 kg (285 lb), SpO2 99 %.  Heart:     Tachycardic  Lungs:    CTA  Capillary Refill:   <2 sec  Peripheral Pulse:   Radial pulse palpable  Skin:     Normal Color and Dry   9:39 PM She  continues to receive IV fluids and started on glucose stabilizer.  His white blood cell count is 16.4, his creatinine and BUN elevated today, he looks very dry, will continue IV fluids.  Antibiotics are running.  X-rays and CT pending.  30cc/kg administered, total of 4L.  Antibiotics administered.   Spoke with hospitalist, will admit.  Ask critical care advice if patient is to come into ICU.  I spoke with Dr. Nelda Marseille who agreed the patient is okay for stepdown at this time with normal vital signs other than hypothermia.  Patient continues to be on insulin drip and receive IV fluids and antibiotics.  Wife updated with the plan.  Vitals:   03/14/18 2145 03/14/18 2200 03/14/18 2215 03/15/18 0000  BP: 125/79 126/71 124/74 138/76  Pulse: 66 65 67 76  Resp: (!) $RemoveB'21 15 16 'qaDmUnKm$ (!) 25  Temp:      TempSrc:      SpO2: 100% 100% 100% 100%  Weight:  Final Clinical Impressions(s) / ED Diagnoses   Final diagnoses:  None    ED Discharge Orders    None       Jeannett Senior, PA-C 03/15/18 0052    Quintella Reichert, MD 03/18/18 1146

## 2018-03-14 NOTE — ED Triage Notes (Signed)
Pt here from home with aloc and hyperglycemia, pt cbg 578 , pt was last seen by family around 44

## 2018-03-14 NOTE — ED Notes (Signed)
Pa Kirichemko aware of chem 8 and lactic acid results

## 2018-03-14 NOTE — Progress Notes (Signed)
Venous blood collect and report to Dr. Ralene Bathe.

## 2018-03-15 ENCOUNTER — Other Ambulatory Visit: Payer: Self-pay

## 2018-03-15 ENCOUNTER — Encounter (HOSPITAL_COMMUNITY): Payer: Self-pay | Admitting: General Practice

## 2018-03-15 DIAGNOSIS — E860 Dehydration: Secondary | ICD-10-CM | POA: Diagnosis present

## 2018-03-15 DIAGNOSIS — R1310 Dysphagia, unspecified: Secondary | ICD-10-CM | POA: Diagnosis present

## 2018-03-15 DIAGNOSIS — E870 Hyperosmolality and hypernatremia: Secondary | ICD-10-CM | POA: Diagnosis not present

## 2018-03-15 DIAGNOSIS — G9341 Metabolic encephalopathy: Secondary | ICD-10-CM | POA: Diagnosis not present

## 2018-03-15 DIAGNOSIS — K222 Esophageal obstruction: Secondary | ICD-10-CM | POA: Diagnosis present

## 2018-03-15 DIAGNOSIS — E1110 Type 2 diabetes mellitus with ketoacidosis without coma: Secondary | ICD-10-CM | POA: Diagnosis present

## 2018-03-15 DIAGNOSIS — R34 Anuria and oliguria: Secondary | ICD-10-CM | POA: Diagnosis not present

## 2018-03-15 DIAGNOSIS — N183 Chronic kidney disease, stage 3 (moderate): Secondary | ICD-10-CM | POA: Diagnosis present

## 2018-03-15 DIAGNOSIS — E43 Unspecified severe protein-calorie malnutrition: Secondary | ICD-10-CM | POA: Diagnosis not present

## 2018-03-15 DIAGNOSIS — I130 Hypertensive heart and chronic kidney disease with heart failure and stage 1 through stage 4 chronic kidney disease, or unspecified chronic kidney disease: Secondary | ICD-10-CM | POA: Diagnosis not present

## 2018-03-15 DIAGNOSIS — N179 Acute kidney failure, unspecified: Secondary | ICD-10-CM | POA: Diagnosis not present

## 2018-03-15 DIAGNOSIS — K269 Duodenal ulcer, unspecified as acute or chronic, without hemorrhage or perforation: Secondary | ICD-10-CM | POA: Diagnosis present

## 2018-03-15 DIAGNOSIS — E876 Hypokalemia: Secondary | ICD-10-CM | POA: Diagnosis present

## 2018-03-15 DIAGNOSIS — I5042 Chronic combined systolic (congestive) and diastolic (congestive) heart failure: Secondary | ICD-10-CM | POA: Diagnosis not present

## 2018-03-15 DIAGNOSIS — A419 Sepsis, unspecified organism: Secondary | ICD-10-CM | POA: Diagnosis present

## 2018-03-15 DIAGNOSIS — T383X6A Underdosing of insulin and oral hypoglycemic [antidiabetic] drugs, initial encounter: Secondary | ICD-10-CM | POA: Diagnosis present

## 2018-03-15 DIAGNOSIS — I2510 Atherosclerotic heart disease of native coronary artery without angina pectoris: Secondary | ICD-10-CM

## 2018-03-15 DIAGNOSIS — R739 Hyperglycemia, unspecified: Secondary | ICD-10-CM | POA: Diagnosis present

## 2018-03-15 DIAGNOSIS — K2210 Ulcer of esophagus without bleeding: Secondary | ICD-10-CM | POA: Diagnosis not present

## 2018-03-15 DIAGNOSIS — E1143 Type 2 diabetes mellitus with diabetic autonomic (poly)neuropathy: Secondary | ICD-10-CM | POA: Diagnosis not present

## 2018-03-15 DIAGNOSIS — E1122 Type 2 diabetes mellitus with diabetic chronic kidney disease: Secondary | ICD-10-CM | POA: Diagnosis not present

## 2018-03-15 DIAGNOSIS — E1311 Other specified diabetes mellitus with ketoacidosis with coma: Secondary | ICD-10-CM

## 2018-03-15 DIAGNOSIS — E1142 Type 2 diabetes mellitus with diabetic polyneuropathy: Secondary | ICD-10-CM | POA: Diagnosis present

## 2018-03-15 DIAGNOSIS — Z6841 Body Mass Index (BMI) 40.0 and over, adult: Secondary | ICD-10-CM | POA: Diagnosis not present

## 2018-03-15 DIAGNOSIS — I10 Essential (primary) hypertension: Secondary | ICD-10-CM

## 2018-03-15 DIAGNOSIS — E861 Hypovolemia: Secondary | ICD-10-CM | POA: Diagnosis not present

## 2018-03-15 DIAGNOSIS — Y92009 Unspecified place in unspecified non-institutional (private) residence as the place of occurrence of the external cause: Secondary | ICD-10-CM | POA: Diagnosis not present

## 2018-03-15 DIAGNOSIS — Z9861 Coronary angioplasty status: Secondary | ICD-10-CM

## 2018-03-15 DIAGNOSIS — K3184 Gastroparesis: Secondary | ICD-10-CM | POA: Diagnosis present

## 2018-03-15 LAB — I-STAT ARTERIAL BLOOD GAS, ED
Acid-base deficit: 19 mmol/L — ABNORMAL HIGH (ref 0.0–2.0)
Bicarbonate: 7 mmol/L — ABNORMAL LOW (ref 20.0–28.0)
O2 Saturation: 98 %
Patient temperature: 92.5
TCO2: 8 mmol/L — ABNORMAL LOW (ref 22–32)
pCO2 arterial: 16.2 mmHg — CL (ref 32.0–48.0)
pH, Arterial: 7.222 — ABNORMAL LOW (ref 7.350–7.450)
pO2, Arterial: 103 mmHg (ref 83.0–108.0)

## 2018-03-15 LAB — BASIC METABOLIC PANEL
Anion gap: 30 — ABNORMAL HIGH (ref 5–15)
Anion gap: 15 (ref 5–15)
Anion gap: 14 (ref 5–15)
Anion gap: 17 — ABNORMAL HIGH (ref 5–15)
BUN: 125 mg/dL — ABNORMAL HIGH (ref 6–20)
BUN: 127 mg/dL — ABNORMAL HIGH (ref 6–20)
BUN: 122 mg/dL — ABNORMAL HIGH (ref 6–20)
BUN: 118 mg/dL — ABNORMAL HIGH (ref 6–20)
BUN: 108 mg/dL — ABNORMAL HIGH (ref 6–20)
CO2: <7 mmol/L — ABNORMAL LOW (ref 22–32)
CO2: 10 mmol/L — ABNORMAL LOW (ref 22–32)
CO2: 21 mmol/L — ABNORMAL LOW (ref 22–32)
CO2: 23 mmol/L (ref 22–32)
CO2: 19 mmol/L — ABNORMAL LOW (ref 22–32)
Calcium: 7.7 mg/dL — ABNORMAL LOW (ref 8.9–10.3)
Calcium: 7.8 mg/dL — ABNORMAL LOW (ref 8.9–10.3)
Calcium: 8 mg/dL — ABNORMAL LOW (ref 8.9–10.3)
Calcium: 7.9 mg/dL — ABNORMAL LOW (ref 8.9–10.3)
Calcium: 8.2 mg/dL — ABNORMAL LOW (ref 8.9–10.3)
Chloride: 97 mmol/L — ABNORMAL LOW (ref 101–111)
Chloride: 99 mmol/L — ABNORMAL LOW (ref 101–111)
Chloride: 111 mmol/L (ref 101–111)
Chloride: 111 mmol/L (ref 101–111)
Chloride: 113 mmol/L — ABNORMAL HIGH (ref 101–111)
Creatinine, Ser: 4.56 mg/dL — ABNORMAL HIGH (ref 0.61–1.24)
Creatinine, Ser: 4.63 mg/dL — ABNORMAL HIGH (ref 0.61–1.24)
Creatinine, Ser: 3.82 mg/dL — ABNORMAL HIGH (ref 0.61–1.24)
Creatinine, Ser: 3.71 mg/dL — ABNORMAL HIGH (ref 0.61–1.24)
Creatinine, Ser: 3.31 mg/dL — ABNORMAL HIGH (ref 0.61–1.24)
GFR calc Af Amer: 15 mL/min — ABNORMAL LOW (ref >60)
GFR calc Af Amer: 15 mL/min — ABNORMAL LOW (ref >60)
GFR calc Af Amer: 18 mL/min — ABNORMAL LOW (ref >60)
GFR calc Af Amer: 19 mL/min — ABNORMAL LOW (ref >60)
GFR calc Af Amer: 22 mL/min — ABNORMAL LOW (ref >60)
GFR calc non Af Amer: 13 mL/min — ABNORMAL LOW (ref >60)
GFR calc non Af Amer: 13 mL/min — ABNORMAL LOW (ref >60)
GFR calc non Af Amer: 16 mL/min — ABNORMAL LOW (ref >60)
GFR calc non Af Amer: 16 mL/min — ABNORMAL LOW (ref >60)
GFR calc non Af Amer: 19 mL/min — ABNORMAL LOW (ref >60)
Glucose, Bld: 924 mg/dL (ref 65–99)
Glucose, Bld: 792 mg/dL (ref 65–99)
Glucose, Bld: 120 mg/dL — ABNORMAL HIGH (ref 65–99)
Glucose, Bld: 89 mg/dL (ref 65–99)
Glucose, Bld: 144 mg/dL — ABNORMAL HIGH (ref 65–99)
Potassium: 4.2 mmol/L (ref 3.5–5.1)
Potassium: 4 mmol/L (ref 3.5–5.1)
Potassium: 3.7 mmol/L (ref 3.5–5.1)
Potassium: 3.5 mmol/L (ref 3.5–5.1)
Potassium: 3.7 mmol/L (ref 3.5–5.1)
Sodium: 137 mmol/L (ref 135–145)
Sodium: 139 mmol/L (ref 135–145)
Sodium: 147 mmol/L — ABNORMAL HIGH (ref 135–145)
Sodium: 148 mmol/L — ABNORMAL HIGH (ref 135–145)
Sodium: 149 mmol/L — ABNORMAL HIGH (ref 135–145)

## 2018-03-15 LAB — MRSA PCR SCREENING: MRSA by PCR: NEGATIVE

## 2018-03-15 LAB — GLUCOSE, CAPILLARY
Glucose-Capillary: 143 mg/dL — ABNORMAL HIGH (ref 65–99)
Glucose-Capillary: 150 mg/dL — ABNORMAL HIGH (ref 65–99)
Glucose-Capillary: 150 mg/dL — ABNORMAL HIGH (ref 65–99)
Glucose-Capillary: 100 mg/dL — ABNORMAL HIGH (ref 65–99)

## 2018-03-15 LAB — RAPID URINE DRUG SCREEN, HOSP PERFORMED
Amphetamines: NOT DETECTED
Barbiturates: NOT DETECTED
Benzodiazepines: NOT DETECTED
Cocaine: NOT DETECTED
Opiates: NOT DETECTED
Tetrahydrocannabinol: NOT DETECTED

## 2018-03-15 LAB — ETHANOL: Alcohol, Ethyl (B): <10 mg/dL (ref <10)

## 2018-03-15 LAB — CBG MONITORING, ED
Glucose-Capillary: 188 mg/dL — ABNORMAL HIGH (ref 65–99)
Glucose-Capillary: 226 mg/dL — ABNORMAL HIGH (ref 65–99)
Glucose-Capillary: 235 mg/dL — ABNORMAL HIGH (ref 65–99)
Glucose-Capillary: 380 mg/dL — ABNORMAL HIGH (ref 65–99)
Glucose-Capillary: 477 mg/dL — ABNORMAL HIGH (ref 65–99)
Glucose-Capillary: 485 mg/dL — ABNORMAL HIGH (ref 65–99)
Glucose-Capillary: >600 mg/dL (ref 65–99)
Glucose-Capillary: >600 mg/dL (ref 65–99)
Glucose-Capillary: >600 mg/dL (ref 65–99)

## 2018-03-15 LAB — TROPONIN I
Troponin I: 0.03 ng/mL (ref <0.03)
Troponin I: 0.05 ng/mL (ref <0.03)
Troponin I: 0.09 ng/mL (ref <0.03)

## 2018-03-15 LAB — LACTIC ACID, PLASMA
Lactic Acid, Venous: 2.3 mmol/L (ref 0.5–1.9)
Lactic Acid, Venous: 2.8 mmol/L (ref 0.5–1.9)

## 2018-03-15 LAB — PROCALCITONIN: Procalcitonin: 0.57 ng/mL

## 2018-03-15 MED ORDER — DEXTROSE-NACL 5-0.45 % IV SOLN
INTRAVENOUS | Status: DC
  Administered 2018-03-15 – 2018-03-16 (×2): via INTRAVENOUS

## 2018-03-15 MED ORDER — ASPIRIN EC 81 MG PO TBEC
81.0000 mg | DELAYED_RELEASE_TABLET | Freq: Every day | ORAL | Status: DC
  Administered 2018-03-16 – 2018-03-21 (×6): 81 mg via ORAL
  Filled 2018-03-15 (×6): qty 1

## 2018-03-15 MED ORDER — FOLIC ACID 1 MG PO TABS
1.0000 mg | ORAL_TABLET | Freq: Every day | ORAL | Status: DC
  Administered 2018-03-16 – 2018-03-22 (×7): 1 mg via ORAL
  Filled 2018-03-15 (×7): qty 1

## 2018-03-15 MED ORDER — POTASSIUM CHLORIDE 10 MEQ/100ML IV SOLN
10.0000 meq | INTRAVENOUS | Status: AC
  Administered 2018-03-15 (×4): 10 meq via INTRAVENOUS
  Filled 2018-03-15 (×4): qty 100

## 2018-03-15 MED ORDER — ONDANSETRON HCL 4 MG/2ML IJ SOLN
4.0000 mg | Freq: Once | INTRAMUSCULAR | Status: AC
  Administered 2018-03-15: 4 mg via INTRAVENOUS
  Filled 2018-03-15: qty 2

## 2018-03-15 MED ORDER — METOCLOPRAMIDE HCL 10 MG PO TABS: 10.0000 mg | ORAL_TABLET | Freq: Three times a day (TID) | ORAL | Status: DC | PRN

## 2018-03-15 MED ORDER — STERILE WATER FOR INJECTION IV SOLN
INTRAVENOUS | Status: DC
  Administered 2018-03-15: 14:00:00 via INTRAVENOUS
  Filled 2018-03-15 (×4): qty 850

## 2018-03-15 MED ORDER — VITAMIN B-1 100 MG PO TABS
100.0000 mg | ORAL_TABLET | Freq: Every day | ORAL | Status: DC
  Administered 2018-03-16 – 2018-03-22 (×7): 100 mg via ORAL
  Filled 2018-03-15 (×7): qty 1

## 2018-03-15 MED ORDER — SODIUM CHLORIDE 0.9 % IV SOLN
INTRAVENOUS | Status: DC
  Administered 2018-03-15 (×2): via INTRAVENOUS

## 2018-03-15 MED ORDER — PIPERACILLIN-TAZOBACTAM 3.375 G IVPB
3.3750 g | Freq: Three times a day (TID) | INTRAVENOUS | Status: DC
  Administered 2018-03-15: 3.375 g via INTRAVENOUS
  Filled 2018-03-15: qty 50

## 2018-03-15 MED ORDER — SODIUM CHLORIDE 0.9 % IV BOLUS
1000.0000 mL | Freq: Once | INTRAVENOUS | Status: AC
  Administered 2018-03-15: 1000 mL via INTRAVENOUS

## 2018-03-15 MED ORDER — TICAGRELOR 90 MG PO TABS
90.0000 mg | ORAL_TABLET | Freq: Two times a day (BID) | ORAL | Status: DC
  Administered 2018-03-16 – 2018-03-22 (×13): 90 mg via ORAL
  Filled 2018-03-15 (×13): qty 1

## 2018-03-15 MED ORDER — SODIUM CHLORIDE 0.9 % IV SOLN
INTRAVENOUS | Status: DC
  Administered 2018-03-15: 8.3 [IU]/h via INTRAVENOUS
  Administered 2018-03-15: 7 [IU]/h via INTRAVENOUS
  Administered 2018-03-15: 7.7 [IU]/h via INTRAVENOUS
  Administered 2018-03-15: 10.8 [IU]/h via INTRAVENOUS
  Administered 2018-03-16: 3.2 [IU]/h via INTRAVENOUS
  Filled 2018-03-15 (×4): qty 1

## 2018-03-15 MED ORDER — VANCOMYCIN HCL 10 G IV SOLR: 1500.0000 mg | INTRAVENOUS | Status: DC

## 2018-03-15 MED ORDER — ADULT MULTIVITAMIN W/MINERALS CH
1.0000 | ORAL_TABLET | Freq: Every day | ORAL | Status: DC
  Administered 2018-03-16 – 2018-03-22 (×7): 1 via ORAL
  Filled 2018-03-15 (×7): qty 1

## 2018-03-15 MED ORDER — PIPERACILLIN-TAZOBACTAM 3.375 G IVPB
3.3750 g | Freq: Two times a day (BID) | INTRAVENOUS | Status: DC
  Administered 2018-03-15: 3.375 g via INTRAVENOUS
  Filled 2018-03-15 (×2): qty 50

## 2018-03-15 MED ORDER — HEPARIN SODIUM (PORCINE) 5000 UNIT/ML IJ SOLN
5000.0000 [IU] | Freq: Three times a day (TID) | INTRAMUSCULAR | Status: DC
  Administered 2018-03-15 – 2018-03-22 (×20): 5000 [IU] via SUBCUTANEOUS
  Filled 2018-03-15 (×22): qty 1

## 2018-03-15 MED ORDER — PANTOPRAZOLE SODIUM 40 MG PO TBEC
40.0000 mg | DELAYED_RELEASE_TABLET | Freq: Every day | ORAL | Status: DC
  Administered 2018-03-16 – 2018-03-21 (×6): 40 mg via ORAL
  Filled 2018-03-15 (×6): qty 1

## 2018-03-15 NOTE — ED Notes (Signed)
Bladder scan completed, 744 ml resulted.

## 2018-03-15 NOTE — ED Notes (Signed)
Bear hugger taken off at this time

## 2018-03-15 NOTE — H&P (Signed)
History and Physical    Joshua [name] RSW:[id number] DOB: March 02, 1956 DOA: 03/14/2018  PCP: [name], MD  Patient coming from: Home Chief Complaint: Confusion  HPI: BRAXTEN [name] is a 62 y.o. male with medical history significant of diabetes, chronic kidney disease, chronic congestive heart failure, gastroparesis is brought in by his wife because of over 2 days of worsening confusion and totally being out of it.  She reports that he has had a gastroparesis flare where he has had a lot of nausea and vomiting the last several days and she thought he was taking his medication but apparently he was not.  He has not taken his meds for several days including his insulin.  Today he was extremely confused and acting very abnormal so she brought him to the ED.  Patient is found to have a sugar of over 1200 with a temperature of 85 degrees rectally.  She does not know of any fevers that he has been having.  She does not know of any diarrhea.  Patient is referred for admission for profound DKA dehydration and possible sepsis.  She denies him having any rashes.  All history is obtained from wife as patient cannot provide any history due to altered mentation.   Review of Systems: As per HPI otherwise 10 point review of systems negativ per wife  Past Medical History:  Diagnosis Date  . Anemia   . Chronic combined systolic and diastolic CHF (congestive heart failure) ([place])    a. 07/2015 Echo: EF 45-50%, mod LVH, mid apical/antsept AK, Gr 1 DD.  [name] CKD (chronic kidney disease), stage III ([place])   . Coronary artery disease    a. 2012 s/p MI/PCI: s/p DES to mid/dist RCA and overlapping DES to LAD;  b. 07/2015 Cath: LAD 10% ISR, D1 40(jailed), LCX 30m, OM2 30, OM3 30, RCA 61m ISR, 10d ISR; c. 07/2016 NSTEMI/PCI: LAD 85ost/p/m (3.5x20 Synergy DES overlapping prior stent), D1 40, LCX 51m, OM2 95 (2.75x16 Synergy DES), OM3 30, RCA 30m, 10d.  . Depression   . Diabetic gastroparesis ([place])   . GERD  (gastroesophageal reflux disease)   . Gout   . Gout   . Heart murmur   . Hyperlipemia   . Hypertensive heart disease   . Ischemic cardiomyopathy    a. 07/2015 Echo: EF 45-50%, mod LVH, mid-apicalanteroseptal AK, Gr1 DD.  . Morbid obesity ([place])   . Myocardial infarction ([place]) 2012  . OSA on CPAP   . Osteomyelitis ([place])    a. 07/2016 MTP joint of R great toe s/p transmetatarsal amputation.  . Polyneuropathy in diabetes(357.2)   . Pulmonary sarcoidosis ([place])    "problems w/it years ago; no problems anymore" (07/03/2014)  . Rectal bleeding   . Type II diabetes mellitus ([place])     Past Surgical History:  Procedure Laterality Date  . AMPUTATION Right 07/24/2016   Procedure: TRANSMETATARSAL FOOT AMPUTATION;  Surgeon: [name], MD;  Location: [facility];  Service: Orthopedics;  Laterality: Right;  . BREAST LUMPECTOMY Right ~ 2000   "benign"  . CARDIAC CATHETERIZATION N/A 07/30/2015   Procedure: Left Heart Cath and Coronary Angiography;  Surgeon: Burnell Blanks, MD;  Location: [facility];  Service: Cardiovascular;  Laterality: N/A;  . CARDIAC CATHETERIZATION N/A 07/19/2016   Procedure: Left Heart Cath and Coronary Angiography;  Surgeon: Belva Crome, MD;  Location: [facility];  Service: Cardiovascular;  Laterality: N/A;  . CARDIAC CATHETERIZATION N/A 07/29/2016   Procedure: Coronary  Stent Intervention;  Surgeon: Belva Crome, MD;  Location: [facility];  Service: Cardiovascular;  Laterality: N/A;  . CARDIAC CATHETERIZATION N/A 07/29/2016   Procedure: Intravascular Ultrasound/IVUS;  Surgeon: Belva Crome, MD;  Location: [facility];  Service: Cardiovascular;  Laterality: N/A;  . CATARACT EXTRACTION W/ INTRAOCULAR LENS  IMPLANT, BILATERAL Bilateral 2014-2015  . COLONOSCOPY WITH PROPOFOL N/A 08/22/2017   Procedure: COLONOSCOPY WITH PROPOFOL;  Surgeon: [name], MD;  Location: [facility];  Service: Endoscopy;  Laterality: N/A;  . CORONARY ANGIOPLASTY  WITH STENT PLACEMENT  10/31/2011   30 % MID ATRIOVENTRICULAR GROOVE CX STENOSIS. 90 % MID RCA.PTA TO LAD/STENTING OF TANDEM 60-70%. LAD STENOSIS 99% REDUCED TO 0%.3.0 X 32MM PROMUS DES POSTDILATED TO 3.25MM.  [name] CORONARY ANGIOPLASTY WITH STENT PLACEMENT  07/03/2014   "2"  . I&D EXTREMITY Right 10/30/2013   Procedure: IRRIGATION AND DEBRIDEMENT RIGHT SMALL FINGER POSSIBLE SECONDARY WOUND CLOSURE;  Surgeon: [name], MD;  Location: [facility];  Service: Orthopedics;  Laterality: Right;  [name] is available after 4pm  . I&D EXTREMITY Right 11/01/2013   Procedure:  IRRIGATION AND DEBRIDEMENT RIGHT  PROXIMAL PHALANGEAL LEVEL AMPUTATION;  Surgeon: [name], MD;  Location: [facility];  Service: Orthopedics;  Laterality: Right;  . INCISION AND DRAINAGE Right 10/28/2013   Procedure: INCISION AND DRAINAGE Right small finger;  Surgeon: [name], MD;  Location: [facility];  Service: Orthopedics;  Laterality: Right;  . LEFT HEART CATH Left 11/03/2011   Procedure: LEFT HEART CATH;  Surgeon: [name], MD;  Location[facility];  Service: Cardiovascular;  Laterality: Left;  . LEFT HEART CATHETERIZATION WITH CORONARY ANGIOGRAM N/A 07/03/2014   Procedure: LEFT HEART CATHETERIZATION WITH CORONARY ANGIOGRAM;  Surgeon: [name], MD;  Location: [facility] CATH LAB;  Service: Cardiovascular;  Laterality: N/A;  . PERCUTANEOUS CORONARY STENT INTERVENTION (PCI-S) Left 11/03/2011   Procedure: PERCUTANEOUS CORONARY STENT INTERVENTION (PCI-S);  Surgeon: [name], MD;  Location: [facility] CATH LAB;  Service: Cardiovascular;  Laterality: Left;  . TOE AMPUTATION Right ~ 2010   "2nd toe"  . TOE AMPUTATION Right 2012   "3rd toe"     reports that he has never smoked. He has never used smokeless tobacco. He reports that he drinks alcohol. He reports that he does not use drugs.  Allergies  Allergen Reactions  . Chlorthalidone Other (See Comments)    Causes dehydration, kidneys shut down    Family  History  Problem Relation Age of Onset  . Heart attack Maternal Aunt   . Diabetes Maternal Grandmother   . Hypertension Maternal Grandmother     Prior to Admission medications   Medication Sig Start Date End Date Taking? Authorizing Provider  acetaminophen (TYLENOL) 325 MG tablet Take 2 tablets (650 mg total) by mouth every 4 (four) hours as needed for headache or mild pain. 08/03/16   Isaiah Serge, NP  acetaminophen (TYLENOL) 650 MG CR tablet Take 1,300 mg by mouth daily as needed for pain.    [provider]  allopurinol (ZYLOPRIM) 100 MG tablet Take 100 mg by mouth 3 (three) times daily.    [provider]  amLODipine (NORVASC) 10 MG tablet Take 1 tablet (10 mg total) by mouth daily. Patient taking differently: Take 10 mg by mouth every evening.  10/07/17   [name], MD  aspirin EC 81 MG tablet Take 81 mg by mouth at bedtime.     [provider]  BRILINTA 90 MG TABS tablet TAKE 1 TABLET BY MOUTH TWICE A DAY Patient taking differently: TAKE 1 TABLET (90 MG) BY MOUTH TWICE A DAY 03/07/17   [name], MD  Capsaicin-Menthol (SALONPAS GEL EX) Apply 1 application topically 2 (two) times daily as needed (shoulder pain).    [provider]  hydrALAZINE (APRESOLINE) 50 MG tablet Take 50 mg by mouth 2 (two) times daily. 09/05/17   [provider]  hydrochlorothiazide (MICROZIDE) 12.5 MG capsule Take 1 capsule (12.5 mg total) by mouth daily. 11/29/17 02/27/18  [name], MD  insulin NPH Human (HUMULIN N,NOVOLIN N) 100 UNIT/ML injection Inject 22 Units into the skin 2 (two) times daily.     [provider]  insulin regular (NOVOLIN R,HUMULIN R) 100 units/mL injection Inject 10-12 Units into the skin daily with supper.     [provider]  isosorbide mononitrate (IMDUR) 120 MG 24 hr tablet Take 1 tablet (120 mg total) by mouth daily. Patient taking differently: Take 120 mg by mouth every evening.  07/29/17   [name],  MD  metoCLOPramide (REGLAN) 10 MG tablet Take 1 tablet (10 mg total) by mouth every 8 (eight) hours as needed for nausea. 11/21/17   Virgel Manifold, MD  metoprolol succinate (TOPROL XL) 100 MG 24 hr tablet Take 1 & 1/2  Tablets daily Patient taking differently: Take 150 mg by mouth every evening.  07/29/17   [name], MD  nitroGLYCERIN (NITROSTAT) 0.4 MG SL tablet Place 1 tablet (0.4 mg total) under the tongue every 5 (five) minutes as needed for chest pain. 11/29/17   [name], MD  pantoprazole (PROTONIX) 40 MG tablet Take 1 tablet (40 mg total) by mouth daily. Patient taking differently: Take 40 mg by mouth every evening.  08/13/16   Love, Ivan Anchors, PA-C  rosuvastatin (CRESTOR) 40 MG tablet Take 1 tablet (40 mg total) by mouth daily. Patient taking differently: Take 40 mg by mouth every evening.  03/23/17   [name], MD    Physical Exam: Vitals:   03/14/18 2145 03/14/18 2200 03/14/18 2215 03/15/18 0000  BP: 125/79 126/71 124/74 138/76  Pulse: 66 65 67 76  Resp: (!) $RemoveB'21 15 16 'XONKwCPa$ (!) 25  Temp:      TempSrc:      SpO2: 100% 100% 100% 100%  Weight:          Constitutional: NAD, calm, comfortable on bear hugger confused but responsive and able to follow simple commands Vitals:   03/14/18 2145 03/14/18 2200 03/14/18 2215 03/15/18 0000  BP: 125/79 126/71 124/74 138/76  Pulse: 66 65 67 76  Resp: (!) $RemoveB'21 15 16 'anDMeDSs$ (!) 25  Temp:      TempSrc:      SpO2: 100% 100% 100% 100%  Weight:       Eyes: PERRL, lids and conjunctivae normal  ENMT: Mucous membranes are very dry. Posterior pharynx clear of any exudate or lesions.Normal dentition.  Neck: normal, supple, no masses, no thyromegaly Respiratory: clear to auscultation bilaterally, no wheezing, no crackles. Normal respiratory effort. No accessory muscle use.  Cardiovascular: Regular rate and rhythm, no murmurs / rubs / gallops. No extremity edema. 2+ pedal pulses. No carotid bruits.  Abdomen: no tenderness, no masses palpated. No  hepatosplenomegaly. Bowel sounds positive.  Musculoskeletal: no clubbing / cyanosis. No joint deformity upper and lower extremities. Good ROM, no contractures. Normal muscle tone.  Right forefoot amputation appears well-healed scarring. Skin: no rashes, lesions, ulcers. No  induration Neurologic: CN 2-12 grossly intact. Sensation intact, DTR normal. Strength 5/5 in all 4.  Psychiatric: No agitation  Labs on Admission: I have personally reviewed following labs and imaging studies  CBC: Recent Labs  Lab 03/14/18 2005 03/14/18 2016  WBC 16.4*  --   NEUTROABS 12.6*  --   HGB 15.9 17.0  HCT 52.0 50.0  MCV 96.3  --   PLT 191  --    Basic Metabolic Panel: Recent Labs  Lab 03/14/18 2005 03/14/18 2016  NA 132* 130*  K 4.9 4.8  CL 87* 96*  CO2 <7*  --   GLUCOSE 1,241* >700*  BUN 148* 118*  CREATININE 5.25* 4.40*  CALCIUM 8.3*  --    GFR: Estimated Creatinine Clearance: 24.1 mL/min (A) (by C-G formula based on SCr of 4.4 mg/dL (H)). Liver Function Tests: Recent Labs  Lab 03/14/18 2005  AST 21  ALT 17  ALKPHOS 123  BILITOT 1.9*  PROT 5.9*  ALBUMIN 2.8*   Recent Labs  Lab 03/14/18 2250  LIPASE 176*   Recent Labs  Lab 03/14/18 2250  AMMONIA 32   Coagulation Profile: No results for input(s): INR, PROTIME in the last 168 hours. Cardiac Enzymes: Recent Labs  Lab 03/14/18 2057  CKTOTAL 620*   BNP (last 3 results) No results for input(s): PROBNP in the last 8760 hours. HbA1C: No results for input(s): HGBA1C in the last 72 hours. CBG: Recent Labs  Lab 03/14/18 2006 03/14/18 2048 03/14/18 2217  GLUCAP >600* >600* >600*   Lipid Profile: No results for input(s): CHOL, HDL, LDLCALC, TRIG, CHOLHDL, LDLDIRECT in the last 72 hours. Thyroid Function Tests: No results for input(s): TSH, T4TOTAL, FREET4, T3FREE, THYROIDAB in the last 72 hours. Anemia Panel: No results for input(s): VITAMINB12, FOLATE, FERRITIN, TIBC, IRON, RETICCTPCT in the last 72 hours. Urine  analysis:    Component Value Date/Time   COLORURINE YELLOW 03/14/2018 2145   APPEARANCEUR HAZY (A) 03/14/2018 2145   LABSPEC 1.018 03/14/2018 2145   PHURINE 5.0 03/14/2018 2145   GLUCOSEU >=500 (A) 03/14/2018 2145   HGBUR SMALL (A) 03/14/2018 2145   BILIRUBINUR NEGATIVE 03/14/2018 2145   KETONESUR 5 (A) 03/14/2018 2145   PROTEINUR 100 (A) 03/14/2018 2145   UROBILINOGEN 0.2 11/01/2011 1410   NITRITE NEGATIVE 03/14/2018 2145   LEUKOCYTESUR NEGATIVE 03/14/2018 2145   Sepsis Labs: !!!!!!!!!!!!!!!!!!!!!!!!!!!!!!!!!!!!!!!!!!!! @LABRCNTIP (procalcitonin:4,lacticidven:4) )No results found for this or any previous visit (from the past 240 hour(s)).   Radiological Exams on Admission: Dg Chest Portable 1 View  Result Date: 03/14/2018 CLINICAL DATA:  Abdominal pain. EXAM: PORTABLE CHEST 1 VIEW COMPARISON:  Chest x-ray dated November 21, 2017. FINDINGS: Stable cardiomegaly. Normal pulmonary vascularity. No focal consolidation, pleural effusion, or pneumothorax. No acute osseous abnormality. IMPRESSION: No active disease. Electronically Signed   By: Titus Dubin M.D.   On: 03/14/2018 21:46    EKG: Independently reviewed.  Normal sinus rhythm  old chart reviewed Case discussed with EDP Chest x-ray personally reviewed no edema or infiltrate   Assessment/Plan 62 year old male with severe DKA with possible sepsis Principal Problem:   Sepsis (HCC)-due to patient's hypothermia will cover with vancomycin and Zosyn for possible sepsis until culture data comes back negative.  Patient's urinalysis appears clean.  His chest x-ray is negative.  He is profoundly dehydrated however may be beneficial repeat repeat a chest x-ray in 48 hours or so.  Patient has no rashes.  Patient temperature is 85 oh placed on bear hugger.  Panculture.  Lactic acid level almost  5 and is resolving with IV fluids.  We will also check procalcitonin level.  Active Problems:   CAD S/P multiple PCIs-serial troponin    Chronic  combined systolic and diastolic congestive heart failure (HCC)-stable at this time due to sepsis she will change with IV fluids until lactic acidosis resolves.  Holding diuretics at this time.   acute renal failure superimposed on stage 3 chronic kidney disease (HCC)-Baseline creatinine less than 2.  It is currently over 5 but is improving with IV fluids.  Making good urine.  Monitor I's and O's closely.  We will hold all nephrotoxic substances.    Essential hypertension-holding blood pressure meds at this time  Severe DKA-ABG is pending.  pH is likely 7 or less.  Aggressive IV fluids.  Insulin drip.  Bicarb given in the ED.  Anion gap initially 30.  Serial BMP every 4 hours.  Empirically covering with antibiotics due to severity of hypothermia with concerns for sepsis of unclear etiology.  Hourly glucose checks.  Supportive care.     DVT prophylaxis: Heparin Code Status: Full Family Communication: Wife and son Disposition Plan: Per day team Consults called: PCM deferred ICU admission Admission status: Admission   Sephira Zellman A MD [facility]  If 7PM-7AM, please contact night-coverage [email or web address] Password [email or web address]  03/15/2018, 12:06 AM

## 2018-03-15 NOTE — Progress Notes (Signed)
Joshua [name] is a 62 y.o. male with medical history significant of diabetes, chronic kidney disease, chronic congestive heart failure, gastroparesis is brought in by his wife because of over 2 days of worsening confusion and totally being out of it.  She reports that he has had a gastroparesis flare where he has had a lot of nausea and vomiting the last several days and she thought he was taking his medication but apparently he was not.  He has not taken his meds for several days including his insulin.  Today he was extremely confused and acting very abnormal so she brought him to the ED.  Patient is found to have a sugar of over 1200 with a temperature of 85 degrees rectally.  She does not know of any fevers that he has been having.  She does not know of any diarrhea.  Patient is referred for admission for profound DKA dehydration and possible sepsis.  She denies him having any rashes.  All history is obtained from wife as patient cannot provide any history due to altered mentation.  03/15/18: seen and examined at his bedside in the ED. His wife and son are in the room. Majority of the story obtained from his wife. Patient has been noncompliant with all his medications, including insulin, asa and brillinta. Wife reports patient is a heavy drinker and stopped alcohol use about 1 and half weeks ago.   Severe renal failure on presentation with urine retention and oliguria. Obtain renal ultrasound and consult nephrology.  Please refer to H&P dictated by Dr Shanon Brow for further details on the assessment and plan.

## 2018-03-15 NOTE — ED Notes (Addendum)
Check CBG meter reads High, RN [name] informed

## 2018-03-15 NOTE — Progress Notes (Signed)
PHARMACY NOTE:  ANTIMICROBIAL RENAL DOSAGE ADJUSTMENT  Current antimicrobial regimen includes a mismatch between antimicrobial dosage and estimated renal function.  As per policy approved by the Pharmacy & Therapeutics and Medical Executive Committees, the antimicrobial dosage will be adjusted accordingly.  Current antimicrobial dosage:  Zosyn 3.375gm Iv q8h  Indication: Sepsis  Renal Function: AKI (effective Crcl<15 ml/min)   '[]'$      On intermittent HD, scheduled: $RemoveBefore'[]'[id number]$      On CRRT    Antimicrobial dosage has been changed to:  Zosyn 3.375gm IV q12h  Additional comments:   Thank you for allowing pharmacy to be a part of this patient's care.  Theotis Burrow, [facility] 03/15/2018 9:12 AM

## 2018-03-15 NOTE — Consult Note (Signed)
Reason for Consult: AKI/CKD stage 3-4 Referring Physician:  [place] Crane, MD  Joshua [name] is an 62 y.o. male.  HPI: Joshua [name] is a 62 yo AAM with PMH significant for longstanding, poorly controlled DM, HTN, CHF, CKD stage 3-4 (followed by [name] with multiple episodes of AKI/CKD in the past) who was brought to [facility] ED by his wife due to 2 day history of worsening confusion, nausea, and vomiting.  His wife does not think he has taken his medications for several days including his insulin.  He was found to have DKA with glucose >1200, and temp of 85 degrees rectally.  He was also noted to have profound metabolic acidosis and AKI/CKD.  The trend in Scr is seen below.  We were consulted to help further evaluate and manage his AKI/CKD.  He had been oliguric/anuric until today when he voided 950 cc's.  He remains nonverbal and lethargic despite improvement of his temperature and blood sugars.  A renal US has been ordered but not yet completed.  His CPK was mildly elevated at 620.  He was noted to have some urinary retention and required foley catheter palcement.   Trend in Creatinine: Creatinine, Ser  Date/Time Value Ref Range Status  03/15/2018 12:36 PM 3.82 (H) 0.61 - 1.24 mg/dL Final  03/15/2018 02:19 AM 4.63 (H) 0.61 - 1.24 mg/dL Final  03/15/2018 12:45 AM 4.56 (H) 0.61 - 1.24 mg/dL Final  03/14/2018 08:16 PM 4.40 (H) 0.61 - 1.24 mg/dL Final  03/14/2018 08:05 PM 5.25 (H) 0.61 - 1.24 mg/dL Final  11/21/2017 06:31 PM 1.47 (H) 0.61 - 1.24 mg/dL Final  05/23/2017 04:16 AM 3.70 (H) 0.61 - 1.24 mg/dL Final  05/22/2017 03:46 AM 4.16 (H) 0.61 - 1.24 mg/dL Final  05/21/2017 03:37 AM 4.50 (H) 0.61 - 1.24 mg/dL Final  05/20/2017 04:17 AM 4.88 (H) 0.61 - 1.24 mg/dL Final  05/19/2017 03:51 PM 5.61 (H) 0.61 - 1.24 mg/dL Final  10/13/2016 05:41 AM 1.38 (H) 0.61 - 1.24 mg/dL Final  10/12/2016 04:45 AM 1.46 (H) 0.61 - 1.24 mg/dL Final  10/11/2016 05:13 PM 1.42 (H) 0.61 - 1.24 mg/dL Final  10/11/2016 12:32 PM  1.54 (H) 0.61 - 1.24 mg/dL Final  08/12/2016 04:30 AM 1.98 (H) 0.61 - 1.24 mg/dL Final  08/09/2016 10:34 AM 2.02 (H) 0.61 - 1.24 mg/dL Final  08/04/2016 05:00 AM 1.80 (H) 0.61 - 1.24 mg/dL Final  08/03/2016 02:42 AM 1.93 (H) 0.61 - 1.24 mg/dL Final  08/02/2016 05:10 AM 1.71 (H) 0.61 - 1.24 mg/dL Final  08/01/2016 03:01 AM 1.70 (H) 0.61 - 1.24 mg/dL Final  07/30/2016 04:11 AM 1.60 (H) 0.61 - 1.24 mg/dL Final  07/29/2016 03:16 AM 1.61 (H) 0.61 - 1.24 mg/dL Final  07/28/2016 04:42 AM 1.78 (H) 0.61 - 1.24 mg/dL Final  07/27/2016 02:44 AM 1.66 (H) 0.61 - 1.24 mg/dL Final  07/26/2016 03:49 AM 1.57 (H) 0.61 - 1.24 mg/dL Final  07/25/2016 02:53 AM 1.55 (H) 0.61 - 1.24 mg/dL Final  07/24/2016 01:11 PM 1.53 (H) 0.61 - 1.24 mg/dL Final  07/23/2016 04:45 AM 1.51 (H) 0.61 - 1.24 mg/dL Final  07/22/2016 04:45 AM 1.52 (H) 0.61 - 1.24 mg/dL Final  07/21/2016 11:48 AM 1.61 (H) 0.61 - 1.24 mg/dL Final  07/20/2016 04:10 AM 1.62 (H) 0.61 - 1.24 mg/dL Final  07/19/2016 05:08 AM 1.45 (H) 0.61 - 1.24 mg/dL Final  07/18/2016 04:12 PM 1.34 (H) 0.61 - 1.24 mg/dL Final  08/07/2015 09:30 AM 1.44 0.40 - 1.50 mg/dL Final  07/31/2015 05:17 AM  1.30 (H) 0.61 - 1.24 mg/dL Final  07/30/2015 03:03 AM 1.30 (H) 0.61 - 1.24 mg/dL Final  07/29/2015 03:01 AM 1.50 (H) 0.61 - 1.24 mg/dL Final  07/29/2015 02:59 AM 1.62 (H) 0.61 - 1.24 mg/dL Final  07/04/2014 05:40 AM 1.06 0.50 - 1.35 mg/dL Final  07/03/2014 10:36 AM 1.10 0.50 - 1.35 mg/dL Final  07/03/2014 10:35 AM 1.02 0.50 - 1.35 mg/dL Final  10/30/2013 04:00 AM 1.11 0.50 - 1.35 mg/dL Final  10/28/2013 11:00 PM 1.21 0.50 - 1.35 mg/dL Final  11/04/2011 05:30 AM 1.06 0.50 - 1.35 mg/dL Final  11/02/2011 03:15 PM 1.08 0.50 - 1.35 mg/dL Final  11/02/2011 05:43 AM 1.17 0.50 - 1.35 mg/dL Final  11/01/2011 05:35 AM 1.17 0.50 - 1.35 mg/dL Final  10/31/2011 05:41 AM 0.92 0.50 - 1.35 mg/dL Final  10/30/2011 03:34 AM 1.14 0.50 - 1.35 mg/dL Final  01/18/2011 01:00 PM 1.09 0.4 - 1.5  mg/dL Final  06/02/2008 05:02 AM 1.07  Final    PMH:   Past Medical History:  Diagnosis Date  . Anemia   . Chronic combined systolic and diastolic CHF (congestive heart failure) ([place])    a. 07/2015 Echo: EF 45-50%, mod LVH, mid apical/antsept AK, Gr 1 DD.  [name] CKD (chronic kidney disease), stage III ([place])   . Coronary artery disease    a. 2012 s/p MI/PCI: s/p DES to mid/dist RCA and overlapping DES to LAD;  b. 07/2015 Cath: LAD 10% ISR, D1 40(jailed), LCX 51m, OM2 30, OM3 30, RCA 84m ISR, 10d ISR; c. 07/2016 NSTEMI/PCI: LAD 85ost/p/m (3.5x20 Synergy DES overlapping prior stent), D1 40, LCX 71m, OM2 95 (2.75x16 Synergy DES), OM3 30, RCA 42m, 10d.  . Depression   . Diabetic gastroparesis ([place])   . DKA (diabetic ketoacidoses) ([place]) 03/14/2018  . GERD (gastroesophageal reflux disease)   . Gout   . Gout   . Heart murmur   . Hyperlipemia   . Hypertensive heart disease   . Ischemic cardiomyopathy    a. 07/2015 Echo: EF 45-50%, mod LVH, mid-apicalanteroseptal AK, Gr1 DD.  . Morbid obesity ([place])   . Myocardial infarction ([place]) 2012  . OSA on CPAP   . Osteomyelitis ([place])    a. 07/2016 MTP joint of R great toe s/p transmetatarsal amputation.  . Polyneuropathy in diabetes(357.2)   . Pulmonary sarcoidosis ([place])    "problems w/it years ago; no problems anymore" (07/03/2014)  . Rectal bleeding   . Type II diabetes mellitus ([facility])     PSH:   Past Surgical History:  Procedure Laterality Date  . AMPUTATION Right 07/24/2016   Procedure: TRANSMETATARSAL FOOT AMPUTATION;  Surgeon: [name], MD;  Location: [place];  Service: Orthopedics;  Laterality: Right;  . BREAST LUMPECTOMY Right ~ 2000   "benign"  . CARDIAC CATHETERIZATION N/A 07/30/2015   Procedure: Left Heart Cath and Coronary Angiography;  Surgeon: Burnell Blanks, MD;  Location: [place] CV LAB;  Service: Cardiovascular;  Laterality: N/A;  . CARDIAC CATHETERIZATION N/A 07/19/2016   Procedure: Left Heart Cath and Coronary Angiography;   Surgeon: Belva Crome, MD;  Location: [place] [facility];  Service: Cardiovascular;  Laterality: N/A;  . CARDIAC CATHETERIZATION N/A 07/29/2016   Procedure: Coronary Stent Intervention;  Surgeon: Belva Crome, MD;  Location: [place] [facility];  Service: Cardiovascular;  Laterality: N/A;  . CARDIAC CATHETERIZATION N/A 07/29/2016   Procedure: Intravascular Ultrasound/IVUS;  Surgeon: Belva Crome, MD;  Location: [place] CV LAB;  Service: Cardiovascular;  Laterality:  N/A;  . CATARACT EXTRACTION W/ INTRAOCULAR LENS  IMPLANT, BILATERAL Bilateral 2014-2015  . COLONOSCOPY WITH PROPOFOL N/A 08/22/2017   Procedure: COLONOSCOPY WITH PROPOFOL;  Surgeon: [name], MD;  Location: [place];  Service: Endoscopy;  Laterality: N/A;  . CORONARY ANGIOPLASTY WITH STENT PLACEMENT  10/31/2011   30 % MID ATRIOVENTRICULAR GROOVE CX STENOSIS. 90 % MID RCA.PTA TO LAD/STENTING OF TANDEM 60-70%. LAD STENOSIS 99% REDUCED TO 0%.3.0 X 32MM PROMUS DES POSTDILATED TO 3.25MM.  [name] CORONARY ANGIOPLASTY WITH STENT PLACEMENT  07/03/2014   "2"  . I&D EXTREMITY Right 10/30/2013   Procedure: IRRIGATION AND DEBRIDEMENT RIGHT SMALL FINGER POSSIBLE SECONDARY WOUND CLOSURE;  Surgeon: [name], MD;  Location: [facility];  Service: Orthopedics;  Laterality: Right;  [name] is available after 4pm  . I&D EXTREMITY Right 11/01/2013   Procedure:  IRRIGATION AND DEBRIDEMENT RIGHT  PROXIMAL PHALANGEAL LEVEL AMPUTATION;  Surgeon: [name], MD;  Location: [facility];  Service: Orthopedics;  Laterality: Right;  . INCISION AND DRAINAGE Right 10/28/2013   Procedure: INCISION AND DRAINAGE Right small finger;  Surgeon: [name], MD;  Location: [facility];  Service: Orthopedics;  Laterality: Right;  . LEFT HEART CATH Left 11/03/2011   Procedure: LEFT HEART CATH;  Surgeon: [name], MD;  Location: [facility] C[facility] LAB;  Service: Cardiovascular;  Laterality: Left;  . LEFT HEART CATHETERIZATION WITH CORONARY ANGIOGRAM N/A  07/03/2014   Procedure: LEFT HEART CATHETERIZATION WITH CORONARY ANGIOGRAM;  Surgeon: [name], MD;  Location: [facility] CATH LAB;  Service: Cardiovascular;  Laterality: N/A;  . PERCUTANEOUS CORONARY STENT INTERVENTION (PCI-S) Left 11/03/2011   Procedure: PERCUTANEOUS CORONARY STENT INTERVENTION (PCI-S);  Surgeon: [name], MD;  Location: [facility] CATH LAB;  Service: Cardiovascular;  Laterality: Left;  . TOE AMPUTATION Right ~ 2010   "2nd toe"  . TOE AMPUTATION Right 2012   "3rd toe"    Allergies:  Allergies  Allergen Reactions  . Chlorthalidone Other (See Comments)    Causes dehydration, kidneys shut down    Medications:   Prior to Admission medications   Medication Sig Start Date End Date Taking? Authorizing Provider  acetaminophen (TYLENOL) 325 MG tablet Take 2 tablets (650 mg total) by mouth every 4 (four) hours as needed for headache or mild pain. 08/03/16  Yes Isaiah Serge, NP  acetaminophen (TYLENOL) 650 MG CR tablet Take 1,300 mg by mouth daily as needed for pain.   Yes [provider]  allopurinol (ZYLOPRIM) 100 MG tablet Take 300 mg by mouth daily.    Yes [provider]  amLODipine (NORVASC) 10 MG tablet Take 1 tablet (10 mg total) by mouth daily. 10/07/17  Yes [name], MD  aspirin EC 81 MG tablet Take 81 mg by mouth at bedtime.    Yes [provider]  BRILINTA 90 MG TABS tablet TAKE 1 TABLET BY MOUTH TWICE A DAY Patient taking differently: TAKE 1 TABLET (90 MG) BY MOUTH TWICE A DAY 03/07/17  Yes [name], MD  hydrALAZINE (APRESOLINE) 50 MG tablet Take 50 mg by mouth every 8 (eight) hours.  09/05/17  Yes [provider]  insulin NPH Human (HUMULIN N,NOVOLIN N) 100 UNIT/ML injection Inject into the skin 2 (two) times daily.    Yes [provider]  insulin regular (NOVOLIN R,HUMULIN R) 100 units/mL injection Inject 10-20 Units into the skin daily with supper.    Yes [provider]  isosorbide mononitrate  (IMDUR) 120 MG 24 hr tablet  Take 1 tablet (120 mg total) by mouth daily. Patient taking differently: Take 120 mg by mouth every evening.  07/29/17  Yes [name], MD  metoprolol succinate (TOPROL XL) 100 MG 24 hr tablet Take 1 & 1/2  Tablets daily Patient taking differently: Take 150 mg by mouth every evening.  07/29/17  Yes [name], MD  nitroGLYCERIN (NITROSTAT) 0.4 MG SL tablet Place 1 tablet (0.4 mg total) under the tongue every 5 (five) minutes as needed for chest pain. 11/29/17  Yes [name], MD  pantoprazole (PROTONIX) 40 MG tablet Take 1 tablet (40 mg total) by mouth daily. Patient taking differently: Take 40 mg by mouth every evening.  08/13/16  Yes Love, Ivan Anchors, PA-C  rosuvastatin (CRESTOR) 40 MG tablet Take 1 tablet (40 mg total) by mouth daily. 03/23/17  Yes [name], MD  hydrochlorothiazide (MICROZIDE) 12.5 MG capsule Take 1 capsule (12.5 mg total) by mouth daily. 11/29/17 02/27/18  [name], MD    Inpatient medications: . aspirin EC  81 mg Oral QHS  . folic acid  1 mg Oral Daily  . heparin  5,000 Units Subcutaneous Q8H  . multivitamin with minerals  1 tablet Oral Daily  . pantoprazole  40 mg Oral Daily  . thiamine  100 mg Oral Daily  . ticagrelor  90 mg Oral BID    Discontinued Meds:   Medications Discontinued During This Encounter  Medication Reason  . sodium chloride 0.9 % bolus 1,000 mL   . vancomycin (VANCOCIN) IVPB 1000 mg/200 mL premix   . sodium chloride 0.9 % bolus 1,000 mL   . dextrose 5 %-0.45 % sodium chloride infusion   . insulin regular bolus via infusion 0-10 Units   . insulin regular (NOVOLIN R,HUMULIN R) 100 Units in sodium chloride 0.9 % 100 mL (1 Units/mL) infusion   . dextrose 50 % solution 25 mL   . 0.9 %  sodium chloride infusion   . piperacillin-tazobactam (ZOSYN) IVPB 3.375 g   . metoCLOPramide (REGLAN) 10 MG tablet Patient Preference  . Capsaicin-Menthol (SALONPAS GEL EX) Patient Preference  . 0.9 %  sodium  chloride infusion     Social History:  reports that he has never smoked. He has never used smokeless tobacco. He reports that he drinks alcohol. He reports that he does not use drugs.  Family History:   Family History  Problem Relation Age of Onset  . Heart attack Maternal Aunt   . Diabetes Maternal Grandmother   . Hypertension Maternal Grandmother     Review of systems not obtained due to patient factors. Weight change:   Intake/Output Summary (Last 24 hours) at 03/15/2018 1436 Last data filed at 03/15/2018 1300 Gross per 24 hour  Intake 6927.54 ml  Output 950 ml  Net 5977.54 ml   BP (!) 153/93 (BP Location: Left Arm)   Pulse (!) 103   Temp (!) 97.5 F (36.4 C) (Oral)   Resp 16   Ht $R'5\' 4"'ZA$  (1.626 m)   Wt 112.2 kg (247 lb 5.7 oz)   SpO2 98%   BMI 42.46 kg/m  Vitals:   03/15/18 0945 03/15/18 1000 03/15/18 1116 03/15/18 1235  BP: (!) 138/92 (!) 149/80 (!) 155/88 (!) 153/93  Pulse: (!) 105 (!) 105  (!) 103  Resp: (!) 22 (!) $Remo'21 15 16  'ndziv$ Temp:   (!) 97.5 F (36.4 C)   TempSrc:   Oral   SpO2: 98% 97% 97% 98%  Weight:  112.2 kg (247 lb 5.7 oz)   Height:   '5\' 4"'$  (1.626 m)      General appearance: nonverbal, lethargic, opens eyes to voice but not able to follow commands or respond  Head: Normocephalic, without obvious abnormality, atraumatic Eyes: negative findings: lids and lashes normal, conjunctivae and sclerae normal and corneas clear Resp: rhonchi bilaterally Cardio: tachycardic, no rub GI: soft, non-tender; bowel sounds normal; no masses,  no organomegaly Extremities: edema minimal pretibial  and s/p right transmet amputation, no ulcers or lesions  Labs: Basic Metabolic Panel: Recent Labs  Lab 03/14/18 2005 03/14/18 2016 03/15/18 0045 03/15/18 0219 03/15/18 1236  NA 132* 130* 137 139 147*  K 4.9 4.8 4.2 4.0 3.7  CL 87* 96* 97* 99* 111  CO2 <7*  --  <7* 10* 21*  GLUCOSE 1,241* >700* 924* 792* 120*  BUN 148* 118* 125* 127* 122*  CREATININE 5.25* 4.40* 4.56*  4.63* 3.82*  ALBUMIN 2.8*  --   --   --   --   CALCIUM 8.3*  --  7.7* 7.8* 8.0*   Liver Function Tests: Recent Labs  Lab 03/14/18 2005  AST 21  ALT 17  ALKPHOS 123  BILITOT 1.9*  PROT 5.9*  ALBUMIN 2.8*   Recent Labs  Lab 03/14/18 2250  LIPASE 176*   Recent Labs  Lab 03/14/18 2250  AMMONIA 32   CBC: Recent Labs  Lab 03/14/18 2005 03/14/18 2016  WBC 16.4*  --   NEUTROABS 12.6*  --   HGB 15.9 17.0  HCT 52.0 50.0  MCV 96.3  --   PLT 191  --    PT/INR: $Remove'@LABRCNTIP'dAtgsry$ (inr:5) Cardiac Enzymes: ) Recent Labs  Lab 03/14/18 2057 03/15/18 0045 03/15/18 0544 03/15/18 1236  CKTOTAL 620*  --   --   --   TROPONINI  --  0.03* 0.05* 0.09*   CBG: Recent Labs  Lab 03/15/18 0802 03/15/18 0929 03/15/18 1040 03/15/18 1111 03/15/18 1233  GLUCAP 235* 226* 188* 143* 150*    Iron Studies: No results for input(s): IRON, TIBC, TRANSFERRIN, FERRITIN in the last 168 hours.  Xrays/Other Studies: Ct Abdomen Pelvis Wo Contrast  Result Date: 03/15/2018 CLINICAL DATA:  Nausea and vomiting EXAM: CT ABDOMEN AND PELVIS WITHOUT CONTRAST TECHNIQUE: Multidetector CT imaging of the abdomen and pelvis was performed following the standard protocol without IV contrast. COMPARISON:  11/21/2017 FINDINGS: LOWER CHEST: No basilar pulmonary nodules or pleural effusion. No apical pericardial effusion. HEPATOBILIARY: Normal hepatic contours and density. No intra- or extrahepatic biliary dilatation. Normal gallbladder. PANCREAS: Normal parenchymal contours without ductal dilatation. No peripancreatic fluid collection. SPLEEN: Normal. ADRENALS/URINARY TRACT: --Adrenal glands: Normal. --Right kidney/ureter: No hydronephrosis, nephroureterolithiasis, perinephric stranding or solid renal mass. --Left kidney/ureter: No hydronephrosis, nephroureterolithiasis, perinephric stranding or solid renal mass. --Urinary bladder: Normal for degree of distention STOMACH/BOWEL: --Stomach/Duodenum: No hiatal hernia or other  gastric abnormality. Normal duodenal course. --Small bowel: No dilatation or inflammation. --Colon: No focal abnormality. --Appendix: Normal. VASCULAR/LYMPHATIC: Normal course and caliber of the major abdominal vessels. No abdominal or pelvic lymphadenopathy. REPRODUCTIVE: Normal prostate and seminal vesicles. MUSCULOSKELETAL. Multilevel degenerative disc disease and facet arthrosis. No bony spinal canal stenosis. OTHER: None. IMPRESSION: No acute abnormality of the abdomen or pelvis. Electronically Signed   By: Ulyses Jarred M.D.   On: 03/15/2018 00:11   Ct Head Wo Contrast  Result Date: 03/15/2018 CLINICAL DATA:  Altered mental status EXAM: CT HEAD WITHOUT CONTRAST TECHNIQUE: Contiguous axial images were obtained from the base of the skull through the vertex  without intravenous contrast. COMPARISON:  12/07/2006 FINDINGS: Brain: No mass lesion, intraparenchymal hemorrhage or extra-axial collection. No evidence of acute cortical infarct. Normal appearance of the brain parenchyma and extra axial spaces for age. Vascular: No hyperdense vessel or unexpected vascular calcification. Skull: Normal visualized skull base, calvarium and extracranial soft tissues. Sinuses/Orbits: No sinus fluid levels or advanced mucosal thickening. No mastoid effusion. Normal orbits. IMPRESSION: Normal head CT. Electronically Signed   By: Ulyses Jarred M.D.   On: 03/15/2018 00:15   Dg Chest Portable 1 View  Result Date: 03/14/2018 CLINICAL DATA:  Abdominal pain. EXAM: PORTABLE CHEST 1 VIEW COMPARISON:  Chest x-ray dated November 21, 2017. FINDINGS: Stable cardiomegaly. Normal pulmonary vascularity. No focal consolidation, pleural effusion, or pneumothorax. No acute osseous abnormality. IMPRESSION: No active disease. Electronically Signed   By: Titus Dubin M.D.   On: 03/14/2018 21:46     Assessment/Plan: 1.  AKI/CKD stage 3-4 likely due to ischemic atn in setting of volume depletion and DKA +/- bladder outlet obstruction.  UOP  has improved after foley cath placed and has received 4 liters of IVF's.  Scr starting to improve as well.  Continue with IVF's and follow UOP and Scr.  Avoid nephrotoxic agents and renal dose medications especially vancomycin. 2. DKA- per primary. Agree with r/o sepsis/infection.   3. Sepsis- on vanco and zosyn, renal dose for ARF 4. CAD- per primary 5. Hypernatremia- will need hypotonic fluids as he is not taking anything by mouth 6. Metabolic acidosis- due to #1 and #2.  Start isotonic bicarb and cont with 1/2 NS for hypernatremia.  Follow bicarb, K, and Ca levels. 7. Chronic combined systolic and diastolic CHF- volume depleted at this time. Continue with IVF's. 8. HTN- BP's on hold due to low BP   Donetta Potts 03/15/2018, 2:36 PM

## 2018-03-15 NOTE — ED Notes (Signed)
Spoke with admitting [place] Crane) in regards to urinary retention and a bladder scan value of 744. See orders

## 2018-03-15 NOTE — ED Notes (Signed)
CBG: 226, RN notified.

## 2018-03-16 ENCOUNTER — Inpatient Hospital Stay: Payer: Self-pay

## 2018-03-16 ENCOUNTER — Inpatient Hospital Stay (HOSPITAL_COMMUNITY): Payer: Medicaid Other

## 2018-03-16 LAB — BASIC METABOLIC PANEL
Anion gap: 15 (ref 5–15)
Anion gap: 17 — ABNORMAL HIGH (ref 5–15)
Anion gap: 12 (ref 5–15)
Anion gap: 15 (ref 5–15)
BUN: 82 mg/dL — ABNORMAL HIGH (ref 6–20)
BUN: 77 mg/dL — ABNORMAL HIGH (ref 6–20)
BUN: 69 mg/dL — ABNORMAL HIGH (ref 6–20)
BUN: 57 mg/dL — ABNORMAL HIGH (ref 6–20)
CO2: 24 mmol/L (ref 22–32)
CO2: 23 mmol/L (ref 22–32)
CO2: 27 mmol/L (ref 22–32)
CO2: 24 mmol/L (ref 22–32)
Calcium: 8 mg/dL — ABNORMAL LOW (ref 8.9–10.3)
Calcium: 8 mg/dL — ABNORMAL LOW (ref 8.9–10.3)
Calcium: 8.1 mg/dL — ABNORMAL LOW (ref 8.9–10.3)
Calcium: 8 mg/dL — ABNORMAL LOW (ref 8.9–10.3)
Chloride: 112 mmol/L — ABNORMAL HIGH (ref 101–111)
Chloride: 113 mmol/L — ABNORMAL HIGH (ref 101–111)
Chloride: 116 mmol/L — ABNORMAL HIGH (ref 101–111)
Chloride: 112 mmol/L — ABNORMAL HIGH (ref 101–111)
Creatinine, Ser: 2.6 mg/dL — ABNORMAL HIGH (ref 0.61–1.24)
Creatinine, Ser: 2.59 mg/dL — ABNORMAL HIGH (ref 0.61–1.24)
Creatinine, Ser: 2.1 mg/dL — ABNORMAL HIGH (ref 0.61–1.24)
Creatinine, Ser: 2.02 mg/dL — ABNORMAL HIGH (ref 0.61–1.24)
GFR calc Af Amer: 29 mL/min — ABNORMAL LOW (ref >60)
GFR calc Af Amer: 30 mL/min — ABNORMAL LOW (ref >60)
GFR calc Af Amer: 38 mL/min — ABNORMAL LOW (ref >60)
GFR calc Af Amer: 40 mL/min — ABNORMAL LOW (ref >60)
GFR calc non Af Amer: 25 mL/min — ABNORMAL LOW (ref >60)
GFR calc non Af Amer: 25 mL/min — ABNORMAL LOW (ref >60)
GFR calc non Af Amer: 33 mL/min — ABNORMAL LOW (ref >60)
GFR calc non Af Amer: 34 mL/min — ABNORMAL LOW (ref >60)
Glucose, Bld: 187 mg/dL — ABNORMAL HIGH (ref 65–99)
Glucose, Bld: 258 mg/dL — ABNORMAL HIGH (ref 65–99)
Glucose, Bld: 100 mg/dL — ABNORMAL HIGH (ref 65–99)
Glucose, Bld: 306 mg/dL — ABNORMAL HIGH (ref 65–99)
Potassium: 3.7 mmol/L (ref 3.5–5.1)
Potassium: 3.3 mmol/L — ABNORMAL LOW (ref 3.5–5.1)
Potassium: 3.4 mmol/L — ABNORMAL LOW (ref 3.5–5.1)
Potassium: 3.9 mmol/L (ref 3.5–5.1)
Sodium: 151 mmol/L — ABNORMAL HIGH (ref 135–145)
Sodium: 153 mmol/L — ABNORMAL HIGH (ref 135–145)
Sodium: 155 mmol/L — ABNORMAL HIGH (ref 135–145)
Sodium: 151 mmol/L — ABNORMAL HIGH (ref 135–145)

## 2018-03-16 LAB — GLUCOSE, CAPILLARY
Glucose-Capillary: 118 mg/dL — ABNORMAL HIGH (ref 65–99)
Glucose-Capillary: 138 mg/dL — ABNORMAL HIGH (ref 65–99)
Glucose-Capillary: 148 mg/dL — ABNORMAL HIGH (ref 65–99)
Glucose-Capillary: 149 mg/dL — ABNORMAL HIGH (ref 65–99)
Glucose-Capillary: 167 mg/dL — ABNORMAL HIGH (ref 65–99)
Glucose-Capillary: 169 mg/dL — ABNORMAL HIGH (ref 65–99)
Glucose-Capillary: 171 mg/dL — ABNORMAL HIGH (ref 65–99)
Glucose-Capillary: 175 mg/dL — ABNORMAL HIGH (ref 65–99)
Glucose-Capillary: 186 mg/dL — ABNORMAL HIGH (ref 65–99)
Glucose-Capillary: 190 mg/dL — ABNORMAL HIGH (ref 65–99)
Glucose-Capillary: 206 mg/dL — ABNORMAL HIGH (ref 65–99)
Glucose-Capillary: 97 mg/dL (ref 65–99)
Glucose-Capillary: 195 mg/dL — ABNORMAL HIGH (ref 65–99)
Glucose-Capillary: 102 mg/dL — ABNORMAL HIGH (ref 65–99)
Glucose-Capillary: 111 mg/dL — ABNORMAL HIGH (ref 65–99)
Glucose-Capillary: 137 mg/dL — ABNORMAL HIGH (ref 65–99)
Glucose-Capillary: 151 mg/dL — ABNORMAL HIGH (ref 65–99)
Glucose-Capillary: 169 mg/dL — ABNORMAL HIGH (ref 65–99)
Glucose-Capillary: 172 mg/dL — ABNORMAL HIGH (ref 65–99)
Glucose-Capillary: 188 mg/dL — ABNORMAL HIGH (ref 65–99)
Glucose-Capillary: 226 mg/dL — ABNORMAL HIGH (ref 65–99)
Glucose-Capillary: 235 mg/dL — ABNORMAL HIGH (ref 65–99)
Glucose-Capillary: 251 mg/dL — ABNORMAL HIGH (ref 65–99)
Glucose-Capillary: 261 mg/dL — ABNORMAL HIGH (ref 65–99)
Glucose-Capillary: 96 mg/dL (ref 65–99)
Glucose-Capillary: 223 mg/dL — ABNORMAL HIGH (ref 65–99)
Glucose-Capillary: 273 mg/dL — ABNORMAL HIGH (ref 65–99)

## 2018-03-16 LAB — HEMOGLOBIN A1C
Hgb A1c MFr Bld: >15.5 % — ABNORMAL HIGH (ref 4.8–5.6)
Mean Plasma Glucose: >398 mg/dL

## 2018-03-16 LAB — CBC
HCT: 40.3 % (ref 39.0–52.0)
Hemoglobin: 14.2 g/dL (ref 13.0–17.0)
MCH: 29.3 pg (ref 26.0–34.0)
MCHC: 35.2 g/dL (ref 30.0–36.0)
MCV: 83.1 fL (ref 78.0–100.0)
Platelets: 140 10*3/uL — ABNORMAL LOW (ref 150–400)
RBC: 4.85 MIL/uL (ref 4.22–5.81)
RDW: 14.5 % (ref 11.5–15.5)
WBC: 15.6 10*3/uL — ABNORMAL HIGH (ref 4.0–10.5)

## 2018-03-16 LAB — URINE CULTURE: Culture: NO GROWTH

## 2018-03-16 LAB — TSH: TSH: 0.608 u[IU]/mL (ref 0.350–4.500)

## 2018-03-16 MED ORDER — STERILE WATER FOR INJECTION IV SOLN: INTRAVENOUS | Status: DC

## 2018-03-16 MED ORDER — SODIUM CHLORIDE 0.9% FLUSH: 10.0000 mL | INTRAVENOUS | Status: DC | PRN

## 2018-03-16 MED ORDER — METOPROLOL SUCCINATE ER 100 MG PO TB24
150.0000 mg | ORAL_TABLET | Freq: Every evening | ORAL | Status: DC
Start: 2018-03-16 — End: 2018-03-22
  Administered 2018-03-16 – 2018-03-21 (×5): 150 mg via ORAL
  Filled 2018-03-16 (×6): qty 1

## 2018-03-16 MED ORDER — FREE WATER: 200.0000 mL | Freq: Three times a day (TID) | Status: DC

## 2018-03-16 MED ORDER — DEXTROSE 5 % IV BOLUS
500.0000 mL | Freq: Once | INTRAVENOUS | Status: AC
  Administered 2018-03-16: 500 mL via INTRAVENOUS

## 2018-03-16 MED ORDER — AMLODIPINE BESYLATE 10 MG PO TABS
10.0000 mg | ORAL_TABLET | Freq: Every day | ORAL | Status: DC
Start: 2018-03-16 — End: 2018-03-22
  Administered 2018-03-16 – 2018-03-22 (×7): 10 mg via ORAL
  Filled 2018-03-16 (×6): qty 1

## 2018-03-16 MED ORDER — POTASSIUM CL IN DEXTROSE 5% 20 MEQ/L IV SOLN
20.0000 meq | INTRAVENOUS | Status: DC
  Administered 2018-03-16 (×2): 20 meq via INTRAVENOUS
  Filled 2018-03-16 (×2): qty 1000
  Filled 2018-03-16: qty 10

## 2018-03-16 MED ORDER — POTASSIUM CHLORIDE CRYS ER 20 MEQ PO TBCR
40.0000 meq | EXTENDED_RELEASE_TABLET | Freq: Three times a day (TID) | ORAL | Status: AC
  Administered 2018-03-16 (×3): 40 meq via ORAL
  Filled 2018-03-16 (×3): qty 2

## 2018-03-16 MED ORDER — ISOSORBIDE MONONITRATE ER 60 MG PO TB24
120.0000 mg | ORAL_TABLET | Freq: Every day | ORAL | Status: DC
Start: 2018-03-16 — End: 2018-03-22
  Administered 2018-03-16 – 2018-03-22 (×7): 120 mg via ORAL
  Filled 2018-03-16 (×6): qty 2

## 2018-03-16 MED ORDER — SODIUM CHLORIDE 0.9% FLUSH
10.0000 mL | Freq: Two times a day (BID) | INTRAVENOUS | Status: DC
  Administered 2018-03-16 – 2018-03-19 (×7): 10 mL
  Administered 2018-03-20: 20 mL
  Administered 2018-03-21: 10 mL
  Administered 2018-03-21: 30 mL
  Administered 2018-03-22: 10 mL

## 2018-03-16 MED ORDER — LORAZEPAM 2 MG/ML IJ SOLN
INTRAMUSCULAR | Status: AC
  Administered 2018-03-16: 1 mg via INTRAVENOUS
  Filled 2018-03-16: qty 1

## 2018-03-16 MED ORDER — HYDRALAZINE HCL 50 MG PO TABS
50.0000 mg | ORAL_TABLET | Freq: Three times a day (TID) | ORAL | Status: DC
  Administered 2018-03-16 – 2018-03-17 (×5): 50 mg via ORAL
  Filled 2018-03-16 (×7): qty 1

## 2018-03-16 MED ORDER — PIPERACILLIN-TAZOBACTAM 3.375 G IVPB
3.3750 g | Freq: Three times a day (TID) | INTRAVENOUS | Status: DC
Start: 2018-03-16 — End: 2018-03-16
  Filled 2018-03-16: qty 50

## 2018-03-16 MED ORDER — LORAZEPAM 2 MG/ML IJ SOLN
1.0000 mg | Freq: Once | INTRAMUSCULAR | Status: AC
  Administered 2018-03-16: 1 mg via INTRAVENOUS

## 2018-03-16 MED ORDER — DEXTROSE 5 % IV BOLUS: 500.0000 mL | Freq: Once | INTRAVENOUS | Status: DC

## 2018-03-16 MED ORDER — DEXTROSE-NACL 5-0.2 % IV SOLN
INTRAVENOUS | Status: DC
  Administered 2018-03-16: 13:00:00 via INTRAVENOUS

## 2018-03-16 MED ORDER — SODIUM CHLORIDE 0.45 % IV SOLN
INTRAVENOUS | Status: DC
  Administered 2018-03-16: 12:00:00 via INTRAVENOUS

## 2018-03-16 NOTE — Progress Notes (Signed)
Patient is writhing in pain; states "I need to pee"; has a foley catheter in place.  Bladder scan completed at bedside which show greater than 980ml in patient bladder.  Foley catheter removed and patient urinates independently immediately.  Bladder scan repeated for PVR and still shows greater than 986ml.  Present foley catheter discontinued and new foley catheter placed.  Patient w/immediate return of 1080ml urine.  Bladder scan repeated again for PVR now showing 37ml in bladder.  Patient is now calm and resting (previously given Ativan $RemoveBefo'1mg'[id number]$  IVP for agitation and attempts to crawl OOB).  Will continue to monitor closely.

## 2018-03-16 NOTE — Progress Notes (Signed)
Peripherally Inserted Central Catheter/Midline Placement  The IV Nurse has discussed with the patient and/or persons authorized to consent for the patient, the purpose of this procedure and the potential benefits and risks involved with this procedure.  The benefits include less needle sticks, lab draws from the catheter, and the patient may be discharged home with the catheter. Risks include, but not limited to, infection, bleeding, blood clot (thrombus formation), and puncture of an artery; nerve damage and irregular heartbeat and possibility to perform a PICC exchange if needed/ordered by physician.  Alternatives to this procedure were also discussed.  Bard Power PICC patient education guide, fact sheet on infection prevention and patient information card has been provided to patient /or left at bedside.  Telephone consent obtained from wife due to altered mental status.  PICC/Midline Placement Documentation  PICC Double Lumen 03/16/18 PICC Right Brachial 44 cm 1 cm (Active)  Indication for Insertion or Continuance of Line Poor Vasculature-patient has had multiple peripheral attempts or PIVs lasting less than 24 hours 03/16/2018  9:23 PM  Exposed Catheter (cm) 1 cm 03/16/2018  9:23 PM  Site Assessment Clean;Dry;Intact 03/16/2018  9:23 PM  Lumen #1 Status Flushed;Saline locked;Blood return noted 03/16/2018  9:23 PM  Lumen #2 Status Flushed;Saline locked;Blood return noted 03/16/2018  9:23 PM  Dressing Type Transparent 03/16/2018  9:23 PM  Dressing Status Clean;Dry;Intact;Antimicrobial disc in place 03/16/2018  9:23 PM  Dressing Change Due 03/23/18 03/16/2018  9:23 PM       Jaclyn Carew, Nicolette Bang 03/16/2018, 9:24 PM

## 2018-03-16 NOTE — Progress Notes (Signed)
Patient is able to awaken, follow commands and take PO meds for RN.  Patient speaks in one word answers - remains drowsy.  Will continue to monitor closely.

## 2018-03-16 NOTE — Evaluation (Signed)
Clinical/Bedside Swallow Evaluation Patient Details  Name: KAMDYN COLBORN MRN: [id number] Date of Birth: 31-Mar-1956  Today's Date: 03/16/2018 Time: SLP Start Time (ACUTE ONLY): 6468 SLP Stop Time (ACUTE ONLY): 1556 SLP Time Calculation (min) (ACUTE ONLY): 15 min  Past Medical History:  Past Medical History:  Diagnosis Date  . Anemia   . Chronic combined systolic and diastolic CHF (congestive heart failure) ([place])    a. 07/2015 Echo: EF 45-50%, mod LVH, mid apical/antsept AK, Gr 1 DD.  [name] CKD (chronic kidney disease), stage III ([place])   . Coronary artery disease    a. 2012 s/p MI/PCI: s/p DES to mid/dist RCA and overlapping DES to LAD;  b. 07/2015 Cath: LAD 10% ISR, D1 40(jailed), LCX 75m, OM2 30, OM3 30, RCA 53m ISR, 10d ISR; c. 07/2016 NSTEMI/PCI: LAD 85ost/p/m (3.5x20 Synergy DES overlapping prior stent), D1 40, LCX 89m, OM2 95 (2.75x16 Synergy DES), OM3 30, RCA 66m, 10d.  . Depression   . Diabetic gastroparesis ([place])   . DKA (diabetic ketoacidoses) ([place]) 03/14/2018  . GERD (gastroesophageal reflux disease)   . Gout   . Gout   . Heart murmur   . Hyperlipemia   . Hypertensive heart disease   . Ischemic cardiomyopathy    a. 07/2015 Echo: EF 45-50%, mod LVH, mid-apicalanteroseptal AK, Gr1 DD.  . Morbid obesity ([place])   . Myocardial infarction ([place]) 2012  . OSA on CPAP   . Osteomyelitis ([place])    a. 07/2016 MTP joint of R great toe s/p transmetatarsal amputation.  . Polyneuropathy in diabetes(357.2)   . Pulmonary sarcoidosis ([place])    "problems w/it years ago; no problems anymore" (07/03/2014)  . Rectal bleeding   . Type II diabetes mellitus ([place])    Past Surgical History:  Past Surgical History:  Procedure Laterality Date  . AMPUTATION Right 07/24/2016   Procedure: TRANSMETATARSAL FOOT AMPUTATION;  Surgeon: [name], MD;  Location: [facility];  Service: Orthopedics;  Laterality: Right;  . BREAST LUMPECTOMY Right ~ 2000   "benign"  . CARDIAC CATHETERIZATION N/A 07/30/2015   Procedure:  Left Heart Cath and Coronary Angiography;  Surgeon: Burnell Blanks, MD;  Location: [facility];  Service: Cardiovascular;  Laterality: N/A;  . CARDIAC CATHETERIZATION N/A 07/19/2016   Procedure: Left Heart Cath and Coronary Angiography;  Surgeon: Belva Crome, MD;  Location: [facility];  Service: Cardiovascular;  Laterality: N/A;  . CARDIAC CATHETERIZATION N/A 07/29/2016   Procedure: Coronary Stent Intervention;  Surgeon: Belva Crome, MD;  Location: [facility];  Service: Cardiovascular;  Laterality: N/A;  . CARDIAC CATHETERIZATION N/A 07/29/2016   Procedure: Intravascular Ultrasound/IVUS;  Surgeon: Belva Crome, MD;  Location: [facility];  Service: Cardiovascular;  Laterality: N/A;  . CATARACT EXTRACTION W/ INTRAOCULAR LENS  IMPLANT, BILATERAL Bilateral 2014-2015  . COLONOSCOPY WITH PROPOFOL N/A 08/22/2017   Procedure: COLONOSCOPY WITH PROPOFOL;  Surgeon: [name], MD;  Location: [facility];  Service: Endoscopy;  Laterality: N/A;  . CORONARY ANGIOPLASTY WITH STENT PLACEMENT  10/31/2011   30 % MID ATRIOVENTRICULAR GROOVE CX STENOSIS. 90 % MID RCA.PTA TO LAD/STENTING OF TANDEM 60-70%. LAD STENOSIS 99% REDUCED TO 0%.3.0 X 32MM PROMUS DES POSTDILATED TO 3.25MM.  [name] CORONARY ANGIOPLASTY WITH STENT PLACEMENT  07/03/2014   "2"  . I&D EXTREMITY Right 10/30/2013   Procedure: IRRIGATION AND DEBRIDEMENT RIGHT SMALL FINGER POSSIBLE SECONDARY WOUND CLOSURE;  Surgeon: [name], MD;  Location: [facility];  Service: Orthopedics;  Laterality: Right;  [name] is available after 4pm  . I&D EXTREMITY Right 11/01/2013   Procedure:  IRRIGATION AND DEBRIDEMENT RIGHT  PROXIMAL PHALANGEAL LEVEL AMPUTATION;  Surgeon: [name], MD;  Location: [facility];  Service: Orthopedics;  Laterality: Right;  . INCISION AND DRAINAGE Right 10/28/2013   Procedure: INCISION AND DRAINAGE Right small finger;  Surgeon: [name], MD;  Location: [facility];  Service:  Orthopedics;  Laterality: Right;  . LEFT HEART CATH Left 11/03/2011   Procedure: LEFT HEART CATH;  Surgeon: [name], MD;  Location: [facility] LAB;  Service: Cardiovascular;  Laterality: Left;  . LEFT HEART CATHETERIZATION WITH CORONARY ANGIOGRAM N/A 07/03/2014   Procedure: LEFT HEART CATHETERIZATION WITH CORONARY ANGIOGRAM;  Surgeon: [name], MD;  Location: [facility];  Service: Cardiovascular;  Laterality: N/A;  . PERCUTANEOUS CORONARY STENT INTERVENTION (PCI-S) Left 11/03/2011   Procedure: PERCUTANEOUS CORONARY STENT INTERVENTION (PCI-S);  Surgeon: [name], MD;  Location: [facility] CATH LAB;  Service: Cardiovascular;  Laterality: Left;  . TOE AMPUTATION Right ~ 2010   "2nd toe"  . TOE AMPUTATION Right 2012   "3rd toe"   HPI:  Jeromiah J McNeillis a 62 y.o.malewith medical history significant ofdiabetes, chronic kidney disease, chronic congestive heart failure, gastroparesis is brought in by his wife because of over 2 days of worsening confusion and totally being out of it. Pt presented to ED with a sugar of over 1200 with a temperature of 85 degrees rectally. Pt admitted for DKA, sepsis, and severe renal failure with urine retention and oliguria. Normal head CT. CXR revealed no active disease.   Assessment / Plan / Recommendation Clinical Impression    Pt presents with a cognitively based oral dysphagia given increased manipulation time and limited bolus awareness during puree PO trials. SLP provided hand over hand assist with cup sips of water, with pt demonstrating no pharyngeal signs concerning for aspiration. Given AMS and reduced oral acceptance of solid trials despite Mod verbal and tactile cues, recommend intitially starting pt on restricted diet of water and medications only with full supervision and aspiration precautions (slow, small sips, HOB elevated, and when pt is alert). Pt likely will be able to advance to solid textures with time as mentation improves.    SLP  Visit Diagnosis: Dysphagia, unspecified (R13.10)    Aspiration Risk  Mild aspiration risk    Diet Recommendation Thin liquid(water)   Liquid Administration via: Cup Medication Administration: Whole meds with liquid Supervision: Staff to assist with self feeding;Full supervision/cueing for compensatory strategies Compensations: Minimize environmental distractions;Slow rate;Small sips/bites Postural Changes: Seated upright at 90 degrees    Other  Recommendations Oral Care Recommendations: Oral care prior to ice chip/H20;Oral care QID   Follow up Recommendations Other (comment)(tbd)      Frequency and Duration min 2x/week  2 weeks       Prognosis Prognosis for Safe Diet Advancement: Good Barriers to Reach Goals: Other (Comment)(AMS)      Swallow Study   General HPI: Wilgus Deyton McNeillis a 62 y.o.malewith medical history significant ofdiabetes, chronic kidney disease, chronic congestive heart failure, gastroparesis is brought in by his wife because of over 2 days of worsening confusion and totally being out of it. Pt presented to ED with a sugar of over 1200 with a temperature of 85 degrees rectally. Pt admitted for DKA, sepsis, and severe renal failure with urine retention and oliguria. Normal head CT. CXR revealed no active disease. Type of Study: Bedside Swallow Evaluation Previous Swallow  Assessment: none in chart Diet Prior to this Study: NPO Temperature Spikes Noted: No Respiratory Status: Room air History of Recent Intubation: No Behavior/Cognition: Alert;Distractible;Requires cueing Oral Cavity Assessment: Within Functional Limits Oral Care Completed by SLP: Yes Oral Cavity - Dentition: Adequate natural dentition Self-Feeding Abilities: Needs assist Patient Positioning: Upright in bed Baseline Vocal Quality: Normal(limited vocalizations) Volitional Cough: Cognitively unable to elicit Volitional Swallow: Unable to elicit    Oral/Motor/Sensory Function Overall Oral  Motor/Sensory Function: Within functional limits(limited assessment- WFL during PO trials)   Ice Chips Ice chips: Not tested   Thin Liquid Thin Liquid: Within functional limits Presentation: Cup    Nectar Thick Nectar Thick Liquid: Not tested   Honey Thick Honey Thick Liquid: Not tested   Puree Puree: Impaired Presentation: Spoon Oral Phase Impairments: Reduced labial seal;Impaired mastication Oral Phase Functional Implications: Prolonged oral transit(prolonged mastication ) Pharyngeal Phase Impairments: (none)   Solid   GO   Solid: Not tested       [place] Jamiyah Dingley SLP Student Clinician  [place] Saul Dorsi 03/16/2018,5:03 PM

## 2018-03-16 NOTE — Progress Notes (Signed)
PROGRESS NOTE  Joshua [name] BJY:[id number] DOB: 08/06/1956 DOA: 03/14/2018 PCP: [name], MD  HPI/Recap of past 24 hours: Joshua [name] is a 62 y.o. male with medical history significant of diabetes, chronic kidney disease, chronic congestive heart failure, gastroparesis is brought in by his wife because of over 2 days of worsening confusion and totally being out of it.  She reports that he has had a gastroparesis flare where he has had a lot of nausea and vomiting the last several days and she thought he was taking his medication but apparently he was not.  He has not taken his meds for several days including his insulin.  Today he was extremely confused and acting very abnormal so she brought him to the ED.  Patient is found to have a sugar of over 1200 with a temperature of 85 degrees rectally.  She does not know of any fevers that he has been having.  She does not know of any diarrhea.  Patient is referred for admission for profound DKA dehydration and possible sepsis.  She denies him having any rashes.  All history is obtained from wife as patient cannot provide any history due to altered mentation.  03/15/18: seen and examined at his bedside in the ED. His wife and son are in the room. Majority of the story obtained from his wife. Patient has been noncompliant with all his medications, including insulin, asa and brillinta. Wife reports patient is a heavy drinker and stopped alcohol use about 1 and half weeks ago.   Severe renal failure on presentation with urine retention and oliguria. Obtain renal ultrasound and consult nephrology.  03/16/18: Patient seen and examined this morning at his bedside. He is alert but minimally interactive. Persistent altered mental status. Worsening hypernatremia. NG tube ordered for free water flushes. On 1/2 NS switched IV fluid to D5 1/4NS to maintain hydration and improve hypernatremia. BMP q4H. Per RN, patient has clear speech. Because of that would not  pursue further brain imaging.  Assessment/Plan: Principal Problem:   Sepsis ([place]) Active Problems:   CAD S/P multiple PCIs   Chronic combined systolic and diastolic congestive heart failure (HCC)   Acute renal failure superimposed on stage 3 chronic kidney disease (HCC)   Essential hypertension   DKA (diabetic ketoacidoses) (HCC)  Acute metabolic encephalopathy most likely multifactorial 2/2 to DKA vs dehydration vs AKI vs others Continues to require insulin drip- Anion gap still open Continue BMPs every 4 hours Continue IV hydration Encourage patient to take oral fluid Nephrology following for AKI on advanced CKD Fall precaution  Hypovolemic hypernatremia, worsening Likely secondary to dehydration Encourage patient to take in oral fluid as he becomes more alert Continue IV hydration D51/4NS NG tube for free water flushes 200 cc every 4 hours Continue to monitor urine output and sodium levels every 4 hours  DKA Continue IV insulin until anion gap closes Anion gap 17 Continue BMPs every 4 hours  Hypokalemia Potassium 3.3 Repleted with p.o. potassium 40 mEq x3 doses Obtain magnesium level in the morning  Severe dehydration Continue IV fluid for now Free water flushes after NG tube placement Monitor urine output and electrolytes  Coronary artery disease status post PCI Denies chest pain Continue aspirin and Brilinta Continue statin Patient stopped taking his Brilinta for 5 days prior to presentation  Urinary retention status post Foley placement Renal ultrasound did not reveal any acute findings.  No obstruction or hydronephrosis mentioned Continue to monitor urine output  AKI on  CKD 4, improving Nephrology following Avoid nephrotoxic agents/hypotension/dehydration Repeat chemistry panel in the morning Monitor urine output  Leukocytosis, possibly reactive Afebrile Urinalysis negative CT abdomen and pelvis unremarkable Chest x-ray unremarkable for any lobular  infiltrates DC IV vancomycin and IV Zosyn and continue to closely monitor Repeat CBC in the morning  Chronic combined systolic and diastolic heart failure Will need to continue IV hydration due to severe dehydration Monitor O2 saturation, vital signs Continue telemetry  Morbid obesity BMI 41 Weight loss outpatient  Medical noncompliance Per his wife, patient had stopped taking all his medications 5 days prior to presentation  History of alcohol abuse Per his wife patient stopped alcohol use a week and half prior to his presentation Continue folate and thiamine Alcohol withdrawal protocol    Code Status: Full  Family Communication: None at bedside. Spoke with his wife in person on 03/15/18.   Disposition Plan: Home when clinically stable.   Consultants:  nephrology  Procedures:  none   Antimicrobials:  Dc IV vanc and IV zosyn today  DVT prophylaxis:  SCDs, sq heparin 5000 u tid   Objective: Vitals:   03/15/18 1952 03/15/18 2320 03/16/18 0500 03/16/18 0813  BP:  (!) 151/75  (!) 170/130  Pulse:  (!) 101  (!) 108  Resp:  16  (!) 26  Temp: 98.4 F (36.9 C) 97.9 F (36.6 C)  98.8 F (37.1 C)  TempSrc: Oral Oral  Oral  SpO2:  96%  100%  Weight:   110.7 kg (244 lb 0.8 oz)   Height:        Intake/Output Summary (Last 24 hours) at 03/16/2018 1208 Last data filed at 03/16/2018 4536 Gross per 24 hour  Intake 206.25 ml  Output 2250 ml  Net -2043.75 ml   Filed Weights   03/14/18 2023 03/15/18 1116 03/16/18 0500  Weight: 129.3 kg (285 lb) 112.2 kg (247 lb 5.7 oz) 110.7 kg (244 lb 0.8 oz)    Exam:   General:  62 yo AAM obese in NAD alert but minimally interactive.  Cardiovascular: RRR no rubs or gallops. No JVD or thyromegaly present.  Respiratory: CTA no wheezes or rales  Abdomen: soft non tender non distended with NBS x 4  Musculoskeletal: right forefoot amputation with a well healed scar.  Skin: As noted above  Psychiatry: Mood is appropriate for  condition and setting.   Data Reviewed: CBC: Recent Labs  Lab 03/14/18 2005 03/14/18 2016 03/16/18 0748  WBC 16.4*  --  15.6*  NEUTROABS 12.6*  --   --   HGB 15.9 17.0 14.2  HCT 52.0 50.0 40.3  MCV 96.3  --  83.1  PLT 191  --  468*   Basic Metabolic Panel: Recent Labs  Lab 03/15/18 1236 03/15/18 1631 03/15/18 2037 03/16/18 0748 03/16/18 1057  NA 147* 148* 149* 151* 153*  K 3.7 3.5 3.7 3.7 3.3*  CL 111 111 113* 112* 113*  CO2 21* 23 19* 24 23  GLUCOSE 120* 89 144* 187* 258*  BUN 122* 118* 108* 82* 77*  CREATININE 3.82* 3.71* 3.31* 2.60* 2.59*  CALCIUM 8.0* 7.9* 8.2* 8.0* 8.0*   GFR: Estimated Creatinine Clearance: 34.7 mL/min (A) (by C-G formula based on SCr of 2.59 mg/dL (H)). Liver Function Tests: Recent Labs  Lab 03/14/18 2005  AST 21  ALT 17  ALKPHOS 123  BILITOT 1.9*  PROT 5.9*  ALBUMIN 2.8*   Recent Labs  Lab 03/14/18 2250  LIPASE 176*   Recent Labs  Lab 03/14/18  2250  AMMONIA 32   Coagulation Profile: No results for input(s): INR, PROTIME in the last 168 hours. Cardiac Enzymes: Recent Labs  Lab 03/14/18 2057 03/15/18 0045 03/15/18 0544 03/15/18 1236  CKTOTAL 620*  --   --   --   TROPONINI  --  0.03* 0.05* 0.09*   BNP (last 3 results) No results for input(s): PROBNP in the last 8760 hours. HbA1C: Recent Labs    03/15/18 0045  HGBA1C >15.5*   CBG: Recent Labs  Lab 03/16/18 0159 03/16/18 0304 03/16/18 0407 03/16/18 0507 03/16/18 0611  GLUCAP 171* 167* 175* 190* 138*   Lipid Profile: No results for input(s): CHOL, HDL, LDLCALC, TRIG, CHOLHDL, LDLDIRECT in the last 72 hours. Thyroid Function Tests: No results for input(s): TSH, T4TOTAL, FREET4, T3FREE, THYROIDAB in the last 72 hours. Anemia Panel: No results for input(s): VITAMINB12, FOLATE, FERRITIN, TIBC, IRON, RETICCTPCT in the last 72 hours. Urine analysis:    Component Value Date/Time   COLORURINE YELLOW 03/14/2018 2145   APPEARANCEUR HAZY (A) 03/14/2018 2145    LABSPEC 1.018 03/14/2018 2145   PHURINE 5.0 03/14/2018 2145   GLUCOSEU >=500 (A) 03/14/2018 2145   HGBUR SMALL (A) 03/14/2018 2145   BILIRUBINUR NEGATIVE 03/14/2018 2145   KETONESUR 5 (A) 03/14/2018 2145   PROTEINUR 100 (A) 03/14/2018 2145   UROBILINOGEN 0.2 11/01/2011 1410   NITRITE NEGATIVE 03/14/2018 2145   LEUKOCYTESUR NEGATIVE 03/14/2018 2145   Sepsis Labs: $[email or web address]'[id number]$ (procalcitonin:4,lacticidven:4)  ) Recent Results (from the past 240 hour(s))  Blood Culture (routine x 2)     Status: None (Preliminary result)   Collection Time: 03/14/18  9:17 PM  Result Value Ref Range Status   Specimen Description BLOOD RIGHT HAND  Final   Special Requests IN PEDIATRIC BOTTLE Blood Culture adequate volume  Final   Culture   Final    NO GROWTH 2 DAYS Performed at [facility], [place] [address], [place], [place] [address]    Report Status PENDING  Incomplete  Urine culture     Status: None   Collection Time: 03/14/18  9:39 PM  Result Value Ref Range Status   Specimen Description URINE, CLEAN CATCH  Final   Special Requests NONE  Final   Culture   Final    NO GROWTH Performed at [facility], [place] [address] [place], [place] [address]    Report Status 03/16/2018 FINAL  Final  Blood Culture (routine x 2)     Status: None (Preliminary result)   Collection Time: 03/14/18 10:50 PM  Result Value Ref Range Status   Specimen Description BLOOD LEFT ANTECUBITAL  Final   Special Requests   Final    BOTTLES DRAWN AEROBIC AND ANAEROBIC Blood Culture adequate volume   Culture   Final    NO GROWTH 1 DAY Performed at [facility], [place] [address] [place], [place] [address]    Report Status PENDING  Incomplete  MRSA PCR Screening     Status: None   Collection Time: 03/15/18  7:25 AM  Result Value Ref Range Status   MRSA by PCR NEGATIVE NEGATIVE Final    Comment:        The GeneXpert MRSA Assay (FDA approved for NASAL specimens only), is one component of a comprehensive MRSA  colonization surveillance program. It is not intended to diagnose MRSA infection nor to guide or monitor treatment for MRSA infections. Performed at [facility], [place] [address] [place],  [address]  Studies: US Renal  Result Date: 03/16/2018 CLINICAL DATA:  Acute renal injury EXAM: RENAL / URINARY TRACT ULTRASOUND COMPLETE COMPARISON:  None. FINDINGS: Right Kidney: Length: 11.0 cm. Echogenicity and renal cortical thickness are within normal limits. No mass or perinephric fluid visualized. There is slight hydronephrosis on the right. No sonographically demonstrable calculus or ureterectasis. Left Kidney: Length: 10.0 cm. Echogenicity and renal cortical thickness are within normal limits. No mass or perinephric fluid visualized. There is slight hydronephrosis on the left. No sonographically demonstrable calculus or ureterectasis. Bladder: Appears normal for degree of bladder distention. Foley catheter in place. There remains significant urine in the bladder despite presence of Foley catheter. IMPRESSION: Foley catheter in place. There is significant urine within the urinary bladder in spite of the Foley catheter present. There is slight fullness of each renal collecting system which potentially could be due to relative distention of the urinary bladder. No obstructing foci identified on either side. Study otherwise unremarkable. Electronically Signed   By: Lowella Grip III M.D.   On: 03/16/2018 09:35    Scheduled Meds: . amLODipine  10 mg Oral Daily  . aspirin EC  81 mg Oral QHS  . folic acid  1 mg Oral Daily  . heparin  5,000 Units Subcutaneous Q8H  . hydrALAZINE  50 mg Oral Q8H  . isosorbide mononitrate  120 mg Oral Daily  . metoprolol succinate  150 mg Oral QPM  . multivitamin with minerals  1 tablet Oral Daily  . pantoprazole  40 mg Oral Daily  . thiamine  100 mg Oral Daily  . ticagrelor  90 mg Oral BID    Continuous Infusions: . sodium chloride 100 mL/hr at  03/16/18 1132  . dextrose 5 % and 0.45% NaCl 125 mL/hr at 03/16/18 0400  . insulin (NOVOLIN-R) infusion 11.7 Units/hr (03/16/18 1129)     LOS: 1 day     Kayleen Memos, MD [facility] Pager [phone]  If 7PM-7AM, please contact night-coverage [email or web address] Password [email or web address] 03/16/2018, 12:08 PM

## 2018-03-16 NOTE — Progress Notes (Addendum)
Initial Nutrition Assessment  DOCUMENTATION CODES:   Morbid obesity  INTERVENTION:  RD to monitor for diet advancement/ PO intake and add supplements as appropriate.    NUTRITION DIAGNOSIS:   Increased nutrient needs related to chronic illness as evidenced by estimated needs.  GOAL:   Patient will meet greater than or equal to 90% of their needs  MONITOR:   Diet advancement, Weight trends, I & O's, Labs  REASON FOR ASSESSMENT:   Consult, Malnutrition Screening Tool Assessment of nutrition requirement/status  ASSESSMENT:   62 y.o. M admitted for altered mental status and DKA dehydration with possible sepsis due to hypothermia. PMH of T2DM, CKD stg III, CHF, CAD, and gastroparesis.   Pt was sleeping with family at bedside (three sons and wife). Spoke with family regarding his nutrition and weight history. Wife reports that his appetite was still good PTA eating melas and snacks, "everything in sight", varying from chicken, pasta, and salads depending on the meal or day - though he mostly eats "all the stuff that is bad for him". However, son interjects that he had been eating much less than he usually was and now drinks much more than he eats, though even that seemed to have decreased some. In response to this wife reports that a lot of time now you will hear him say "I don't know what I want to eat" or "I don't have a taste for that". Son interjects that he only really eats when his mother cooks and won't eat what he brings home because it's not good for him.  Due to the variance in information re-asked about his appetite and his weight stability. Wife and sons report that his appetite and weight have both gone down dramatically since thanksgiving saying that each week gets worse and that his clothes were sliding off of him - they estimated a weight loss of about 25 lbs and that he will eat two meals a day and will eat much smaller portions. Asked the wife and sons about how many  fistfuls he will eat in a meal to get a more specific grasp of his intake and they report a couple at best, but usually about a fistful.   Once he starts eating again, asked the family if a supplement was given if he would drink it, they all seemed to think he would depending on the taste and flavor (only like vanilla), though he has had Glucerna before.   Wife reports that in the last couple of weeks he has not been taking his insulin due to his nausea and gastroparesis, but typically takes novolin R/N. She says that his diabetes in not well controlled stating " he does all the things he is not supposed to do". Suspect unintended weight loss due to poor glycemic control.   Medications reviewed: folic acid, heparin, MVI with minerals, protonix, thiamine, dextrose 5%-0.45% sodium chloride infusion IV 148ml/hr, novolin R 100 units IV.   Labs reviewed: Na 151 (H), Cl 112 (H), BG 187 (H), BUN 82 (H), creatinine 2.6 (H), GFR 25 (L), WBC 15.6 (H).    Intake/Output Summary (Last 24 hours) at 03/16/2018 1208 Last data filed at 03/16/2018 0952 Gross per 24 hour  Intake 206.25 ml  Output 2250 ml  Net -2043.75 ml    Last eight HgA1C readings, ref: <7 diabetics  05/19/17: 11.8 (H) 11/17/16: 8.5 (H) 07/18/16: 9.5 (H) 02/16/16: 10.8 (H) 10/13/15: 14.2 (H) 07/03/14: 10 (H) 10/09/13: 16.2 (H) 11/01/11: 11.3 (H)  NUTRITION - FOCUSED PHYSICAL  EXAM: Was unable to conduct NFPE due to pt sleeping; could not visually examine because the covers were over his head and he was turned onto his side.   Diet Order:  Diet NPO time specified Fall precautions  EDUCATION NEEDS:   No education needs have been identified at this time  Skin:  Skin Assessment: Reviewed RN Assessment  Last BM:  03/14/18  Height:   Ht Readings from Last 1 Encounters:  03/15/18 $RemoveB'5\' 4"'MvTLBxMj$  (1.626 m)    Weight:   Wt Readings from Last 1 Encounters:  03/16/18 244 lb 0.8 oz (110.7 kg)   UBW: 300-310 lbs % UBW: 81% 19% loss of body weight  in 8 months  Ideal Body Weight:  59 kg  BMI:  Body mass index is 41.89 kg/m.  Estimated Nutritional Needs:   Kcal:  1650-1850 kcal  Protein:  80-95grams  Fluid:  >1.7 L or per MD    Hope Budds, Dietetic Intern

## 2018-03-16 NOTE — Progress Notes (Signed)
Pharmacy Antibiotic Note  Joshua [name] is a 62 y.o. male admitted on 03/14/2018 with DKA/ sepsis.  Pharmacy has been consulted for vancomycin and Zosyn dosing.  -WBC= 15.6, afeb, PCT= 0.57, cultures- ngtd  Plan: -Change zosyn to 3.375gm IV q8h -Could consider d/c vancomycin  -Will follow renal function, cultures and clinical progress    Height: $Remove'5\' 4"'VuIQRHs$  (162.6 cm) Weight: 244 lb 0.8 oz (110.7 kg) IBW/kg (Calculated) : 59.2  Temp (24hrs), Avg:98.2 F (36.8 C), Min:97.5 F (36.4 C), Max:98.8 F (37.1 C)  Recent Labs  Lab 03/14/18 2005 03/14/18 2016 03/14/18 2302 03/15/18 0045 03/15/18 0219 03/15/18 1236 03/15/18 1631 03/15/18 2037 03/16/18 0748  WBC 16.4*  --   --   --   --   --   --   --  15.6*  CREATININE 5.25* 4.40*  --  4.56* 4.63* 3.82* 3.71* 3.31* 2.60*  LATICACIDVEN  --  4.55* 3.23* 2.3* 2.8*  --   --   --   --     Estimated Creatinine Clearance: 34.5 mL/min (A) (by C-G formula based on SCr of 2.6 mg/dL (H)).    Allergies  Allergen Reactions  . Chlorthalidone Other (See Comments)    Causes dehydration, kidneys shut down    Thank you for allowing pharmacy to be a part of this patient's care.  Hildred Laser, PharmD Clinical Pharmacist Clinical phone from 8:30-4:00 is [phone] After 4pm, please call Main [facility] [phone]) for assistance. 03/16/2018 10:22 AM

## 2018-03-16 NOTE — Progress Notes (Signed)
S: Still nonverbal O:BP (!) 170/130 (BP Location: Left Arm) Comment: RN notified  Pulse (!) 108   Temp 98.8 F (37.1 C) (Oral)   Resp (!) 26   Ht $R'5\' 4"'Dv$  (1.626 m)   Wt 110.7 kg (244 lb 0.8 oz)   SpO2 100%   BMI 41.89 kg/m   Intake/Output Summary (Last 24 hours) at 03/16/2018 1023 Last data filed at 03/16/2018 0952 Gross per 24 hour  Intake 1883.75 ml  Output 3050 ml  Net -1166.25 ml   Intake/Output: I/O last 3 completed shifts: In: 7133.8 [I.V.:2583.8; IV Piggyback:4550] Out: 2150 [Urine:2150]  Intake/Output this shift:  Total I/O In: -  Out: 900 [Urine:900] Weight change: -17.1 kg (-37 lb 10.3 oz) Gen: NAD, somnolent CVS: tachy, no rub Resp: scattered rhonchi Abd: +BS, soft, NT/ND Ext: no edema, s/p right transmet amputation  Recent Labs  Lab 03/14/18 2005 03/14/18 2016 03/15/18 0045 03/15/18 0219 03/15/18 1236 03/15/18 1631 03/15/18 2037 03/16/18 0748  NA 132* 130* 137 139 147* 148* 149* 151*  K 4.9 4.8 4.2 4.0 3.7 3.5 3.7 3.7  CL 87* 96* 97* 99* 111 111 113* 112*  CO2 <7*  --  <7* 10* 21* 23 19* 24  GLUCOSE 1,241* >700* 924* 792* 120* 89 144* 187*  BUN 148* 118* 125* 127* 122* 118* 108* 82*  CREATININE 5.25* 4.40* 4.56* 4.63* 3.82* 3.71* 3.31* 2.60*  ALBUMIN 2.8*  --   --   --   --   --   --   --   CALCIUM 8.3*  --  7.7* 7.8* 8.0* 7.9* 8.2* 8.0*  AST 21  --   --   --   --   --   --   --   ALT 17  --   --   --   --   --   --   --    Liver Function Tests: Recent Labs  Lab 03/14/18 2005  AST 21  ALT 17  ALKPHOS 123  BILITOT 1.9*  PROT 5.9*  ALBUMIN 2.8*   Recent Labs  Lab 03/14/18 2250  LIPASE 176*   Recent Labs  Lab 03/14/18 2250  AMMONIA 32   CBC: Recent Labs  Lab 03/14/18 2005 03/14/18 2016 03/16/18 0748  WBC 16.4*  --  15.6*  NEUTROABS 12.6*  --   --   HGB 15.9 17.0 14.2  HCT 52.0 50.0 40.3  MCV 96.3  --  83.1  PLT 191  --  140*   Cardiac Enzymes: Recent Labs  Lab 03/14/18 2057 03/15/18 0045 03/15/18 0544 03/15/18 1236   CKTOTAL 620*  --   --   --   TROPONINI  --  0.03* 0.05* 0.09*   CBG: Recent Labs  Lab 03/16/18 0159 03/16/18 0304 03/16/18 0407 03/16/18 0507 03/16/18 0611  GLUCAP 171* 167* 175* 190* 138*    Iron Studies: No results for input(s): IRON, TIBC, TRANSFERRIN, FERRITIN in the last 72 hours. Studies/Results: Ct Abdomen Pelvis Wo Contrast  Result Date: 03/15/2018 CLINICAL DATA:  Nausea and vomiting EXAM: CT ABDOMEN AND PELVIS WITHOUT CONTRAST TECHNIQUE: Multidetector CT imaging of the abdomen and pelvis was performed following the standard protocol without IV contrast. COMPARISON:  11/21/2017 FINDINGS: LOWER CHEST: No basilar pulmonary nodules or pleural effusion. No apical pericardial effusion. HEPATOBILIARY: Normal hepatic contours and density. No intra- or extrahepatic biliary dilatation. Normal gallbladder. PANCREAS: Normal parenchymal contours without ductal dilatation. No peripancreatic fluid collection. SPLEEN: Normal. ADRENALS/URINARY TRACT: --Adrenal glands: Normal. --Right kidney/ureter: No hydronephrosis, nephroureterolithiasis,  perinephric stranding or solid renal mass. --Left kidney/ureter: No hydronephrosis, nephroureterolithiasis, perinephric stranding or solid renal mass. --Urinary bladder: Normal for degree of distention STOMACH/BOWEL: --Stomach/Duodenum: No hiatal hernia or other gastric abnormality. Normal duodenal course. --Small bowel: No dilatation or inflammation. --Colon: No focal abnormality. --Appendix: Normal. VASCULAR/LYMPHATIC: Normal course and caliber of the major abdominal vessels. No abdominal or pelvic lymphadenopathy. REPRODUCTIVE: Normal prostate and seminal vesicles. MUSCULOSKELETAL. Multilevel degenerative disc disease and facet arthrosis. No bony spinal canal stenosis. OTHER: None. IMPRESSION: No acute abnormality of the abdomen or pelvis. Electronically Signed   By: Ulyses Jarred M.D.   On: 03/15/2018 00:11   Ct Head Wo Contrast  Result Date: 03/15/2018 CLINICAL  DATA:  Altered mental status EXAM: CT HEAD WITHOUT CONTRAST TECHNIQUE: Contiguous axial images were obtained from the base of the skull through the vertex without intravenous contrast. COMPARISON:  12/07/2006 FINDINGS: Brain: No mass lesion, intraparenchymal hemorrhage or extra-axial collection. No evidence of acute cortical infarct. Normal appearance of the brain parenchyma and extra axial spaces for age. Vascular: No hyperdense vessel or unexpected vascular calcification. Skull: Normal visualized skull base, calvarium and extracranial soft tissues. Sinuses/Orbits: No sinus fluid levels or advanced mucosal thickening. No mastoid effusion. Normal orbits. IMPRESSION: Normal head CT. Electronically Signed   By: Ulyses Jarred M.D.   On: 03/15/2018 00:15   US Renal  Result Date: 03/16/2018 CLINICAL DATA:  Acute renal injury EXAM: RENAL / URINARY TRACT ULTRASOUND COMPLETE COMPARISON:  None. FINDINGS: Right Kidney: Length: 11.0 cm. Echogenicity and renal cortical thickness are within normal limits. No mass or perinephric fluid visualized. There is slight hydronephrosis on the right. No sonographically demonstrable calculus or ureterectasis. Left Kidney: Length: 10.0 cm. Echogenicity and renal cortical thickness are within normal limits. No mass or perinephric fluid visualized. There is slight hydronephrosis on the left. No sonographically demonstrable calculus or ureterectasis. Bladder: Appears normal for degree of bladder distention. Foley catheter in place. There remains significant urine in the bladder despite presence of Foley catheter. IMPRESSION: Foley catheter in place. There is significant urine within the urinary bladder in spite of the Foley catheter present. There is slight fullness of each renal collecting system which potentially could be due to relative distention of the urinary bladder. No obstructing foci identified on either side. Study otherwise unremarkable. Electronically Signed   By: Lowella Grip III M.D.   On: 03/16/2018 09:35   Dg Chest Portable 1 View  Result Date: 03/14/2018 CLINICAL DATA:  Abdominal pain. EXAM: PORTABLE CHEST 1 VIEW COMPARISON:  Chest x-ray dated November 21, 2017. FINDINGS: Stable cardiomegaly. Normal pulmonary vascularity. No focal consolidation, pleural effusion, or pneumothorax. No acute osseous abnormality. IMPRESSION: No active disease. Electronically Signed   By: Titus Dubin M.D.   On: 03/14/2018 21:46   . aspirin EC  81 mg Oral QHS  . folic acid  1 mg Oral Daily  . heparin  5,000 Units Subcutaneous Q8H  . LORazepam      . LORazepam  1 mg Intravenous Once  . multivitamin with minerals  1 tablet Oral Daily  . pantoprazole  40 mg Oral Daily  . thiamine  100 mg Oral Daily  . ticagrelor  90 mg Oral BID    BMET    Component Value Date/Time   NA 151 (H) 03/16/2018 0748   K 3.7 03/16/2018 0748   CL 112 (H) 03/16/2018 0748   CO2 24 03/16/2018 0748   GLUCOSE 187 (H) 03/16/2018 0748   BUN 82 (H) 03/16/2018 4010  CREATININE 2.60 (H) 03/16/2018 0748   CREATININE 2.06 (H) 11/17/2016 0921   CALCIUM 8.0 (L) 03/16/2018 0748   GFRNONAA 25 (L) 03/16/2018 0748   GFRNONAA 51 (L) 11/01/2016 1012   GFRAA 29 (L) 03/16/2018 0748   GFRAA 58 (L) 11/01/2016 1012   CBC    Component Value Date/Time   WBC 15.6 (H) 03/16/2018 0748   RBC 4.85 03/16/2018 0748   HGB 14.2 03/16/2018 0748   HCT 40.3 03/16/2018 0748   PLT 140 (L) 03/16/2018 0748   MCV 83.1 03/16/2018 0748   MCH 29.3 03/16/2018 0748   MCHC 35.2 03/16/2018 0748   RDW 14.5 03/16/2018 0748   LYMPHSABS 1.8 03/14/2018 2005   MONOABS 2.0 (H) 03/14/2018 2005   EOSABS 0.0 03/14/2018 2005   BASOSABS 0.0 03/14/2018 2005     Assessment/Plan: 1.  AKI/CKD stage 3-4 likely due to ischemic atn in setting of volume depletion and DKA +/- bladder outlet obstruction.  UOP has improved after foley cath placed and has received 4 liters of IVF's.   1. Scr continues to improve, continue with IVF's and  follow UOP and Scr.   2. Avoid nephrotoxic agents and renal dose medications especially vancomycin. 2. DKA- per primary. Agree with r/o sepsis/infection.   3. Sepsis- on vanco and zosyn, renal dose for ARF, cultures negative to date.  Doesn't appear to have a UTI and CT of abdomen and pelvis was negative as well as CXR negative for infiltrate/consolidation.  Consider stopping abx. 4. CAD- per primary 5. Hypernatremia- will need hypotonic fluids as he is not taking anything by mouth 1. Will stop isotonic bicarb drip and add 1/2 NS and recheck sodium later as he may require D5W if he remains unable to take po or place NGT for free water boluses of 200cc q4 hours (he already pulled out an IV so not sure he would keep an NGT in) 6. Metabolic acidosis- due to #1 and #2.  resolved with bicarb drip.  Follow bicarb, K, and Ca levels. 7. Chronic combined systolic and diastolic CHF- volume depleted at this time. Continue with IVF's. 8. HTN- BP meds were on hold due to low BP, now high.  Would give hydralazine IV as well as metoprolol per primary.  Could also place catapress patch 0.$RemoveBeforeDEI'1mg'[id number]$ /hr on pt to help with bp for now as well.   Donetta Potts, MD [employer] [phone]

## 2018-03-16 NOTE — Progress Notes (Signed)
Patient found attempting to climb OOB, naked and confused.  Physician paged and order received for bedside sitter.  Requested bedside renal ultrasound - unable to do unless patient is calm.  Physician notified.

## 2018-03-16 NOTE — Progress Notes (Addendum)
SLP has evaluated patient at the bedside for swallow; recommend OK to drink water PO per cups.  Patient has  Consumed 485ml free water today per PO intake w/meds.  Pt remains lethargic and speech is unclear at this time; however, patient is arousable and cooperative. Again, patient was medicated w/Ativan 1mg  IVP this AM; Creat 2.60.  Will continue to monitor.

## 2018-03-17 LAB — CBC
HCT: 39.4 % (ref 39.0–52.0)
Hemoglobin: 12.9 g/dL — ABNORMAL LOW (ref 13.0–17.0)
MCH: 28.5 pg (ref 26.0–34.0)
MCHC: 32.7 g/dL (ref 30.0–36.0)
MCV: 87 fL (ref 78.0–100.0)
Platelets: 127 10*3/uL — ABNORMAL LOW (ref 150–400)
RBC: 4.53 MIL/uL (ref 4.22–5.81)
RDW: 15 % (ref 11.5–15.5)
WBC: 13.5 10*3/uL — ABNORMAL HIGH (ref 4.0–10.5)

## 2018-03-17 LAB — BASIC METABOLIC PANEL
Anion gap: 10 (ref 5–15)
Anion gap: 11 (ref 5–15)
Anion gap: 9 (ref 5–15)
Anion gap: 11 (ref 5–15)
BUN: 54 mg/dL — ABNORMAL HIGH (ref 6–20)
BUN: 49 mg/dL — ABNORMAL HIGH (ref 6–20)
BUN: 43 mg/dL — ABNORMAL HIGH (ref 6–20)
BUN: 41 mg/dL — ABNORMAL HIGH (ref 6–20)
CO2: 25 mmol/L (ref 22–32)
CO2: 26 mmol/L (ref 22–32)
CO2: 26 mmol/L (ref 22–32)
CO2: 26 mmol/L (ref 22–32)
Calcium: 7.9 mg/dL — ABNORMAL LOW (ref 8.9–10.3)
Calcium: 8.1 mg/dL — ABNORMAL LOW (ref 8.9–10.3)
Calcium: 8.2 mg/dL — ABNORMAL LOW (ref 8.9–10.3)
Calcium: 8 mg/dL — ABNORMAL LOW (ref 8.9–10.3)
Chloride: 113 mmol/L — ABNORMAL HIGH (ref 101–111)
Chloride: 113 mmol/L — ABNORMAL HIGH (ref 101–111)
Chloride: 114 mmol/L — ABNORMAL HIGH (ref 101–111)
Chloride: 112 mmol/L — ABNORMAL HIGH (ref 101–111)
Creatinine, Ser: 2.06 mg/dL — ABNORMAL HIGH (ref 0.61–1.24)
Creatinine, Ser: 1.73 mg/dL — ABNORMAL HIGH (ref 0.61–1.24)
Creatinine, Ser: 1.77 mg/dL — ABNORMAL HIGH (ref 0.61–1.24)
Creatinine, Ser: 1.84 mg/dL — ABNORMAL HIGH (ref 0.61–1.24)
GFR calc Af Amer: 39 mL/min — ABNORMAL LOW (ref >60)
GFR calc Af Amer: 48 mL/min — ABNORMAL LOW (ref >60)
GFR calc Af Amer: 47 mL/min — ABNORMAL LOW (ref >60)
GFR calc Af Amer: 45 mL/min — ABNORMAL LOW (ref >60)
GFR calc non Af Amer: 34 mL/min — ABNORMAL LOW (ref >60)
GFR calc non Af Amer: 41 mL/min — ABNORMAL LOW (ref >60)
GFR calc non Af Amer: 40 mL/min — ABNORMAL LOW (ref >60)
GFR calc non Af Amer: 38 mL/min — ABNORMAL LOW (ref >60)
Glucose, Bld: 297 mg/dL — ABNORMAL HIGH (ref 65–99)
Glucose, Bld: 136 mg/dL — ABNORMAL HIGH (ref 65–99)
Glucose, Bld: 161 mg/dL — ABNORMAL HIGH (ref 65–99)
Glucose, Bld: 359 mg/dL — ABNORMAL HIGH (ref 65–99)
Potassium: 3.6 mmol/L (ref 3.5–5.1)
Potassium: 3.8 mmol/L (ref 3.5–5.1)
Potassium: 4 mmol/L (ref 3.5–5.1)
Potassium: 4.2 mmol/L (ref 3.5–5.1)
Sodium: 148 mmol/L — ABNORMAL HIGH (ref 135–145)
Sodium: 150 mmol/L — ABNORMAL HIGH (ref 135–145)
Sodium: 149 mmol/L — ABNORMAL HIGH (ref 135–145)
Sodium: 149 mmol/L — ABNORMAL HIGH (ref 135–145)

## 2018-03-17 LAB — GLUCOSE, CAPILLARY
Glucose-Capillary: 110 mg/dL — ABNORMAL HIGH (ref 65–99)
Glucose-Capillary: 119 mg/dL — ABNORMAL HIGH (ref 65–99)
Glucose-Capillary: 153 mg/dL — ABNORMAL HIGH (ref 65–99)
Glucose-Capillary: 164 mg/dL — ABNORMAL HIGH (ref 65–99)
Glucose-Capillary: 205 mg/dL — ABNORMAL HIGH (ref 65–99)
Glucose-Capillary: 209 mg/dL — ABNORMAL HIGH (ref 65–99)
Glucose-Capillary: 247 mg/dL — ABNORMAL HIGH (ref 65–99)
Glucose-Capillary: 306 mg/dL — ABNORMAL HIGH (ref 65–99)
Glucose-Capillary: 313 mg/dL — ABNORMAL HIGH (ref 65–99)
Glucose-Capillary: 316 mg/dL — ABNORMAL HIGH (ref 65–99)
Glucose-Capillary: 401 mg/dL — ABNORMAL HIGH (ref 65–99)
Glucose-Capillary: 407 mg/dL — ABNORMAL HIGH (ref 65–99)
Glucose-Capillary: 415 mg/dL — ABNORMAL HIGH (ref 65–99)
Glucose-Capillary: 443 mg/dL — ABNORMAL HIGH (ref 65–99)
Glucose-Capillary: 87 mg/dL (ref 65–99)

## 2018-03-17 LAB — PHOSPHORUS
Phosphorus: 1 mg/dL — CL (ref 2.5–4.6)
Phosphorus: 1.3 mg/dL — ABNORMAL LOW (ref 2.5–4.6)

## 2018-03-17 LAB — ALBUMIN: Albumin: 2.1 g/dL — ABNORMAL LOW (ref 3.5–5.0)

## 2018-03-17 MED ORDER — POTASSIUM PHOSPHATES 15 MMOLE/5ML IV SOLN
10.0000 mmol | Freq: Once | INTRAVENOUS | Status: AC
  Administered 2018-03-17: 10 mmol via INTRAVENOUS
  Filled 2018-03-17: qty 3.33

## 2018-03-17 MED ORDER — SUCRALFATE 1 GM/10ML PO SUSP
1.0000 g | Freq: Three times a day (TID) | ORAL | Status: DC
  Administered 2018-03-17 – 2018-03-20 (×12): 1 g via ORAL
  Filled 2018-03-17 (×12): qty 10

## 2018-03-17 MED ORDER — DEXTROSE-NACL 5-0.2 % IV SOLN: INTRAVENOUS | Status: DC

## 2018-03-17 MED ORDER — GI COCKTAIL ~~LOC~~
30.0000 mL | Freq: Once | ORAL | Status: AC
  Administered 2018-03-17: 30 mL via ORAL
  Filled 2018-03-17: qty 30

## 2018-03-17 MED ORDER — INSULIN ASPART 100 UNIT/ML ~~LOC~~ SOLN
6.0000 [IU] | Freq: Once | SUBCUTANEOUS | Status: AC
  Administered 2018-03-17: 6 [IU] via SUBCUTANEOUS

## 2018-03-17 MED ORDER — INSULIN ASPART 100 UNIT/ML ~~LOC~~ SOLN
10.0000 [IU] | Freq: Once | SUBCUTANEOUS | Status: AC
  Administered 2018-03-17: 10 [IU] via SUBCUTANEOUS

## 2018-03-17 MED ORDER — INSULIN GLARGINE 100 UNIT/ML ~~LOC~~ SOLN
10.0000 [IU] | Freq: Every day | SUBCUTANEOUS | Status: DC
  Administered 2018-03-17: 10 [IU] via SUBCUTANEOUS
  Filled 2018-03-17 (×2): qty 0.1

## 2018-03-17 MED ORDER — INSULIN ASPART 100 UNIT/ML ~~LOC~~ SOLN
0.0000 [IU] | Freq: Three times a day (TID) | SUBCUTANEOUS | Status: DC
  Administered 2018-03-17: 5 [IU] via SUBCUTANEOUS
  Administered 2018-03-17: 11 [IU] via SUBCUTANEOUS

## 2018-03-17 MED ORDER — INSULIN ASPART 100 UNIT/ML ~~LOC~~ SOLN: 0.0000 [IU] | Freq: Every day | SUBCUTANEOUS | Status: DC

## 2018-03-17 MED ORDER — DEXTROSE 5 % IV SOLN
INTRAVENOUS | Status: DC
  Administered 2018-03-17: 10:00:00 via INTRAVENOUS

## 2018-03-17 NOTE — Progress Notes (Addendum)
Pt glucose was 415. MD made aware. Awaiting orders. Will continue to monitor.  10 units of novolog was given, pt glucose now 443. MD made aware. New orders given. Will continue to monitor.  New orders for 6 units of novolog given. Pt glucose decreased to 401. MD notified. Will continue to monitor.

## 2018-03-17 NOTE — Progress Notes (Signed)
PROGRESS NOTE  EDVARDO HONSE [name]:[id number] DOB: [birth year] DOA: 03/14/2018 PCP: [name], MD  HPI/Recap of past 24 hours: Joshua [name] is a 62 y.o. male with medical history significant of diabetes, chronic kidney disease, chronic congestive heart failure, gastroparesis is brought in by his wife because of over 2 days of worsening confusion and totally being out of it.  She reports that he has had a gastroparesis flare where he has had a lot of nausea and vomiting the last several days and she thought he was taking his medication but apparently he was not.  He has not taken his meds for several days including his insulin.  Today he was extremely confused and acting very abnormal so she brought him to the ED.  Patient is found to have a sugar of over 1200 with a temperature of 85 degrees rectally.  She does not know of any fevers that he has been having.  She does not know of any diarrhea.  Patient is referred for admission for profound DKA dehydration and possible sepsis.  She denies him having any rashes.  All history is obtained from wife as patient cannot provide any history due to altered mentation.  03/15/18: seen and examined at his bedside in the ED. His wife and son are in the room. Majority of the story obtained from his wife. Patient has been noncompliant with all his medications, including insulin, asa and brillinta. Wife reports patient is a heavy drinker and stopped alcohol use about 1 and half weeks ago.   Severe renal failure on presentation with urine retention and oliguria. Obtain renal ultrasound and consult nephrology.  03/16/18: Patient seen and examined this morning at his bedside. He is alert but minimally interactive. Persistent altered mental status. Worsening hypernatremia. NG tube ordered for free water flushes. On 1/2 NS switched IV fluid to D5 1/4NS to maintain hydration and improve hypernatremia. BMP q4H. Per RN, patient has clear speech. Because of that would not  pursue further brain imaging.  03/17/18: Seen and examined at his bedside.  Patient is alert and oriented x.3.  States he is hungry.  We will transition him to long-acting insulin and allow for oral nutrition.  Assessment/Plan: Principal Problem:   Sepsis ([place]) Active Problems:   CAD S/P multiple PCIs   Chronic combined systolic and diastolic congestive heart failure (HCC)   Acute renal failure superimposed on stage 3 chronic kidney disease (HCC)   Essential hypertension   DKA (diabetic ketoacidoses) (HCC)  Acute metabolic encephalopathy most likely multifactorial 2/2 to DKA vs dehydration vs AKI vs others, resolving Anion gap has closed now transitioning to long-acting insulin Continue BMPs every 4 hours Continue IV hydration Encourage patient to take oral fluid Nephrology following for AKI on advanced CKD Fall precaution  Hypovolemic hypernatremia likely secondary to severe dehydration, improving Sodium 149 from 155 yesterday Continue D5W at 75 cc/h Continue BMPs every 4 hours until sodium level 145 or less Encourage p.o. fluid intake Continue to monitor urine output  Severe hypophosphatemia Phosphorus of 1.0 Replete with K-Phos Repeat levels in the morning  Severe protein calorie malnutrition Albumin 2.1 Dietary consult to improve deficiencies Oral supplementation  DKA, resolving Anion gap has closed Transitioning to long-acting insulin Continue to monitor gap and electrolytes  Hypokalemia Potassium 4.0 from 3.3 Repleted   Severe dehydration Continue IV fluid for now Continue to encourage p.o. fluid intake  Coronary artery disease status post PCI Denies chest pain Continue aspirin and Brilinta Continue statin Patient  stopped taking his Brilinta for 5 days prior to presentation  Urinary retention status post Foley placement Renal ultrasound did not reveal any acute findings.  No obstruction or hydronephrosis mentioned Continue to monitor urine output  AKI  on CKD 4, improving Nephrology following Avoid nephrotoxic agents/hypotension/dehydration Repeat chemistry panel in the morning Monitor urine output  Leukocytosis, possibly reactive WBC continues to trend down Afebrile Urinalysis negative CT abdomen and pelvis unremarkable Chest x-ray unremarkable for any lobular infiltrates DC IV vancomycin and IV Zosyn and continue to closely monitor Repeat CBC in the morning  Chronic combined systolic and diastolic heart failure Will need to continue IV hydration due to severe dehydration Monitor O2 saturation, vital signs Continue telemetry  Morbid obesity BMI 41 Weight loss outpatient  Medical noncompliance Per his wife, patient had stopped taking all his medications 5 days prior to presentation  History of alcohol abuse Per his wife patient stopped alcohol use a week and half prior to his presentation Continue folate and thiamine Alcohol withdrawal protocol    Code Status: Full  Family Communication: None at bedside. Spoke with his wife in person on 03/15/18.   Disposition Plan: Home when clinically stable.   Consultants:  nephrology  Procedures:  none   Antimicrobials:  Dc IV vanc and IV zosyn today  DVT prophylaxis:  SCDs, sq heparin 5000 u tid   Objective: Vitals:   03/17/18 0315 03/17/18 0500 03/17/18 0755 03/17/18 1102  BP: 111/64  (!) 145/101 (!) 84/72  Pulse: 94  83 91  Resp: (!) 22  15 (!) 22  Temp: 98.7 F (37.1 C)  98.5 F (36.9 C) 99.1 F (37.3 C)  TempSrc: Oral  Oral Oral  SpO2: 99%  98% 97%  Weight:  110.6 kg (243 lb 13.3 oz)    Height:        Intake/Output Summary (Last 24 hours) at 03/17/2018 1425 Last data filed at 03/17/2018 1300 Gross per 24 hour  Intake 885.62 ml  Output 3475 ml  Net -2589.38 ml   Filed Weights   03/15/18 1116 03/16/18 0500 03/17/18 0500  Weight: 112.2 kg (247 lb 5.7 oz) 110.7 kg (244 lb 0.8 oz) 110.6 kg (243 lb 13.3 oz)    Exam: 03/17/18   General: 62 year old  African-American male well-developed well-nourished no acute distress.  Alert and oriented x3.  Cardiovascular: Regular rate and rhythm with no rubs or gallops.  No JVD or thyromegaly present.    Respiratory: CTA no wheezes or rales  Abdomen: soft non tender non distended with NBS x 4  Musculoskeletal: right forefoot amputation with a well healed scar.  Skin: As noted above  Psychiatry: Mood is appropriate for condition and setting.   Data Reviewed: CBC: Recent Labs  Lab 03/14/18 2005 03/14/18 2016 03/16/18 0748 03/17/18 1016  WBC 16.4*  --  15.6* 13.5*  NEUTROABS 12.6*  --   --   --   HGB 15.9 17.0 14.2 12.9*  HCT 52.0 50.0 40.3 39.4  MCV 96.3  --  83.1 87.0  PLT 191  --  140* 782*   Basic Metabolic Panel: Recent Labs  Lab 03/16/18 1532 03/16/18 2220 03/17/18 0130 03/17/18 0537 03/17/18 1016  NA 155* 151* 148* 150* 149*  K 3.4* 3.9 3.6 3.8 4.0  CL 116* 112* 113* 113* 114*  CO2 $Re'27 24 25 26 26  'ewK$ GLUCOSE 100* 306* 297* 136* 161*  BUN 69* 57* 54* 49* 43*  CREATININE 2.10* 2.02* 2.06* 1.73* 1.77*  CALCIUM 8.1* 8.0* 7.9* 8.1*  8.2*  PHOS  --   --   --  1.0*  --    GFR: Estimated Creatinine Clearance: 50.7 mL/min (A) (by C-G formula based on SCr of 1.77 mg/dL (H)). Liver Function Tests: Recent Labs  Lab 03/14/18 2005 03/17/18 0537  AST 21  --   ALT 17  --   ALKPHOS 123  --   BILITOT 1.9*  --   PROT 5.9*  --   ALBUMIN 2.8* 2.1*   Recent Labs  Lab 03/14/18 2250  LIPASE 176*   Recent Labs  Lab 03/14/18 2250  AMMONIA 32   Coagulation Profile: No results for input(s): INR, PROTIME in the last 168 hours. Cardiac Enzymes: Recent Labs  Lab 03/14/18 2057 03/15/18 0045 03/15/18 0544 03/15/18 1236  CKTOTAL 620*  --   --   --   TROPONINI  --  0.03* 0.05* 0.09*   BNP (last 3 results) No results for input(s): PROBNP in the last 8760 hours. HbA1C: Recent Labs    03/15/18 0045  HGBA1C >15.5*   CBG: Recent Labs  Lab 03/16/18 1729 03/16/18 1844  03/16/18 1954 03/16/18 2136 03/17/18 1224  GLUCAP 137* 169* 223* 273* 209*   Lipid Profile: No results for input(s): CHOL, HDL, LDLCALC, TRIG, CHOLHDL, LDLDIRECT in the last 72 hours. Thyroid Function Tests: Recent Labs    03/16/18 1233  TSH 0.608   Anemia Panel: No results for input(s): VITAMINB12, FOLATE, FERRITIN, TIBC, IRON, RETICCTPCT in the last 72 hours. Urine analysis:    Component Value Date/Time   COLORURINE YELLOW 03/14/2018 2145   APPEARANCEUR HAZY (A) 03/14/2018 2145   LABSPEC 1.018 03/14/2018 2145   PHURINE 5.0 03/14/2018 2145   GLUCOSEU >=500 (A) 03/14/2018 2145   HGBUR SMALL (A) 03/14/2018 2145   BILIRUBINUR NEGATIVE 03/14/2018 2145   KETONESUR 5 (A) 03/14/2018 2145   PROTEINUR 100 (A) 03/14/2018 2145   UROBILINOGEN 0.2 11/01/2011 1410   NITRITE NEGATIVE 03/14/2018 2145   LEUKOCYTESUR NEGATIVE 03/14/2018 2145   Sepsis Labs: $RemoveBefo'@[email or web address]'rlLEUWwejHv$ (procalcitonin:4,lacticidven:4)  ) Recent Results (from the past 240 hour(s))  Blood Culture (routine x 2)     Status: None (Preliminary result)   Collection Time: 03/14/18  9:17 PM  Result Value Ref Range Status   Specimen Description BLOOD RIGHT HAND  Final   Special Requests IN PEDIATRIC BOTTLE Blood Culture adequate volume  Final   Culture   Final    NO GROWTH 3 DAYS Performed at [facility], [place] [address], [place], [place] [address]    Report Status PENDING  Incomplete  Urine culture     Status: None   Collection Time: 03/14/18  9:39 PM  Result Value Ref Range Status   Specimen Description URINE, CLEAN CATCH  Final   Special Requests NONE  Final   Culture   Final    NO GROWTH Performed at [facility], [place] [address] [place], [place] [address]    Report Status 03/16/2018 FINAL  Final  Blood Culture (routine x 2)     Status: None (Preliminary result)   Collection Time: 03/14/18 10:50 PM  Result Value Ref Range Status   Specimen Description BLOOD LEFT ANTECUBITAL  Final   Special Requests    Final    BOTTLES DRAWN AEROBIC AND ANAEROBIC Blood Culture adequate volume   Culture   Final    NO GROWTH 2 DAYS Performed at [facility], [place] [address], [place], [place] [address]    Report Status PENDING  Incomplete  MRSA PCR Screening     Status: None   Collection Time: 03/15/18  7:25 AM  Result Value Ref Range Status   MRSA by PCR NEGATIVE NEGATIVE Final    Comment:        The GeneXpert MRSA Assay (FDA approved for NASAL specimens only), is one component of a comprehensive MRSA colonization surveillance program. It is not intended to diagnose MRSA infection nor to guide or monitor treatment for MRSA infections. Performed at [facility], [place] [address] [place], [place] [address]       Studies: [place] Ekg Site Rite  Result Date: 03/16/2018 If [facility] image not attached, placement could not be confirmed due to current cardiac rhythm.   Scheduled Meds: . amLODipine  10 mg Oral Daily  . aspirin EC  81 mg Oral QHS  . folic acid  1 mg Oral Daily  . heparin  5,000 Units Subcutaneous Q8H  . hydrALAZINE  50 mg Oral Q8H  . insulin aspart  0-15 Units Subcutaneous TID WC  . insulin aspart  0-5 Units Subcutaneous QHS  . insulin glargine  10 Units Subcutaneous Daily  . isosorbide mononitrate  120 mg Oral Daily  . metoprolol succinate  150 mg Oral QPM  . multivitamin with minerals  1 tablet Oral Daily  . pantoprazole  40 mg Oral Daily  . sodium chloride flush  10-40 mL Intracatheter Q12H  . thiamine  100 mg Oral Daily  . ticagrelor  90 mg Oral BID    Continuous Infusions: . dextrose 75 mL/hr at 03/17/18 1012  . potassium PHOSPHATE IVPB (mmol) 10 mmol (03/17/18 1259)     LOS: 2 days     Kayleen Memos, MD [facility] Pager [phone]  If 7PM-7AM, please contact night-coverage [email or web address] Password [email or web address] 03/17/2018, 2:25 PM

## 2018-03-17 NOTE — Progress Notes (Signed)
S:More awake and verbal today but disoriented O:BP (!) 84/72 (BP Location: Left Arm)   Pulse 91   Temp 99.1 F (37.3 C) (Oral)   Resp (!) 22   Ht $R'5\' 4"'dX$  (1.626 m)   Wt 110.6 kg (243 lb 13.3 oz)   SpO2 97%   BMI 41.85 kg/m   Intake/Output Summary (Last 24 hours) at 03/17/2018 1120 Last data filed at 03/17/2018 1100 Gross per 24 hour  Intake 845.62 ml  Output 3950 ml  Net -3104.38 ml   Intake/Output: I/O last 3 completed shifts: In: 925.6 [P.O.:700; I.V.:225.6] Out: 5125 [Urine:5125]  Intake/Output this shift:  Total I/O In: 120 [P.O.:120] Out: 800 [Urine:800] Weight change: -1.6 kg (-3 lb 8.4 oz) Gen: NAD CVS: no rub Resp: cta Abd: benign Ext: no edema  Recent Labs  Lab 03/14/18 2005  03/16/18 0748 03/16/18 1057 03/16/18 1532 03/16/18 2220 03/17/18 0130 03/17/18 0537 03/17/18 1016  NA 132*   < > 151* 153* 155* 151* 148* 150* 149*  K 4.9   < > 3.7 3.3* 3.4* 3.9 3.6 3.8 4.0  CL 87*   < > 112* 113* 116* 112* 113* 113* 114*  CO2 <7*   < > $R'24 23 27 24 25 26 26  'Ba$ GLUCOSE 1,241*   < > 187* 258* 100* 306* 297* 136* 161*  BUN 148*   < > 82* 77* 69* 57* 54* 49* 43*  CREATININE 5.25*   < > 2.60* 2.59* 2.10* 2.02* 2.06* 1.73* 1.77*  ALBUMIN 2.8*  --   --   --   --   --   --   --   --   CALCIUM 8.3*   < > 8.0* 8.0* 8.1* 8.0* 7.9* 8.1* 8.2*  AST 21  --   --   --   --   --   --   --   --   ALT 17  --   --   --   --   --   --   --   --    < > = values in this interval not displayed.   Liver Function Tests: Recent Labs  Lab 03/14/18 2005  AST 21  ALT 17  ALKPHOS 123  BILITOT 1.9*  PROT 5.9*  ALBUMIN 2.8*   Recent Labs  Lab 03/14/18 2250  LIPASE 176*   Recent Labs  Lab 03/14/18 2250  AMMONIA 32   CBC: Recent Labs  Lab 03/14/18 2005 03/14/18 2016 03/16/18 0748 03/17/18 1016  WBC 16.4*  --  15.6* 13.5*  NEUTROABS 12.6*  --   --   --   HGB 15.9 17.0 14.2 12.9*  HCT 52.0 50.0 40.3 39.4  MCV 96.3  --  83.1 87.0  PLT 191  --  140* 127*   Cardiac  Enzymes: Recent Labs  Lab 03/14/18 2057 03/15/18 0045 03/15/18 0544 03/15/18 1236  CKTOTAL 620*  --   --   --   TROPONINI  --  0.03* 0.05* 0.09*   CBG: Recent Labs  Lab 03/16/18 1635 03/16/18 1729 03/16/18 1844 03/16/18 1954 03/16/18 2136  GLUCAP 111* 137* 169* 223* 273*    Iron Studies: No results for input(s): IRON, TIBC, TRANSFERRIN, FERRITIN in the last 72 hours. Studies/Results: US Renal  Result Date: 03/16/2018 CLINICAL DATA:  Acute renal injury EXAM: RENAL / URINARY TRACT ULTRASOUND COMPLETE COMPARISON:  None. FINDINGS: Right Kidney: Length: 11.0 cm. Echogenicity and renal cortical thickness are within normal limits. No mass or perinephric fluid visualized. There  is slight hydronephrosis on the right. No sonographically demonstrable calculus or ureterectasis. Left Kidney: Length: 10.0 cm. Echogenicity and renal cortical thickness are within normal limits. No mass or perinephric fluid visualized. There is slight hydronephrosis on the left. No sonographically demonstrable calculus or ureterectasis. Bladder: Appears normal for degree of bladder distention. Foley catheter in place. There remains significant urine in the bladder despite presence of Foley catheter. IMPRESSION: Foley catheter in place. There is significant urine within the urinary bladder in spite of the Foley catheter present. There is slight fullness of each renal collecting system which potentially could be due to relative distention of the urinary bladder. No obstructing foci identified on either side. Study otherwise unremarkable. Electronically Signed   By: Lowella Grip III M.D.   On: 03/16/2018 09:35   [place] Ekg Site Rite  Result Date: 03/16/2018 If Site Rite image not attached, placement could not be confirmed due to current cardiac rhythm.  [name] amLODipine  10 mg Oral Daily  . aspirin EC  81 mg Oral QHS  . folic acid  1 mg Oral Daily  . heparin  5,000 Units Subcutaneous Q8H  . hydrALAZINE  50 mg Oral Q8H  .  insulin aspart  0-15 Units Subcutaneous TID WC  . insulin aspart  0-5 Units Subcutaneous QHS  . insulin glargine  10 Units Subcutaneous Daily  . isosorbide mononitrate  120 mg Oral Daily  . metoprolol succinate  150 mg Oral QPM  . multivitamin with minerals  1 tablet Oral Daily  . pantoprazole  40 mg Oral Daily  . sodium chloride flush  10-40 mL Intracatheter Q12H  . thiamine  100 mg Oral Daily  . ticagrelor  90 mg Oral BID    BMET    Component Value Date/Time   NA 149 (H) 03/17/2018 1016   K 4.0 03/17/2018 1016   CL 114 (H) 03/17/2018 1016   CO2 26 03/17/2018 1016   GLUCOSE 161 (H) 03/17/2018 1016   BUN 43 (H) 03/17/2018 1016   CREATININE 1.77 (H) 03/17/2018 1016   CREATININE 2.06 (H) 11/17/2016 0921   CALCIUM 8.2 (L) 03/17/2018 1016   GFRNONAA 40 (L) 03/17/2018 1016   GFRNONAA 51 (L) 11/01/2016 1012   GFRAA 47 (L) 03/17/2018 1016   GFRAA 58 (L) 11/01/2016 1012   CBC    Component Value Date/Time   WBC 13.5 (H) 03/17/2018 1016   RBC 4.53 03/17/2018 1016   HGB 12.9 (L) 03/17/2018 1016   HCT 39.4 03/17/2018 1016   PLT 127 (L) 03/17/2018 1016   MCV 87.0 03/17/2018 1016   MCH 28.5 03/17/2018 1016   MCHC 32.7 03/17/2018 1016   RDW 15.0 03/17/2018 1016   LYMPHSABS 1.8 03/14/2018 2005   MONOABS 2.0 (H) 03/14/2018 2005   EOSABS 0.0 03/14/2018 2005   BASOSABS 0.0 03/14/2018 2005    Assessment/Plan: 1. AKI/CKD stage 3-4 likely due to ischemic atn in setting of volume depletion and DKA +/- bladder outlet obstruction. UOP has improved after foley cath placed and has received 4 liters of IVF's.  1. Scr continues to improve, continue with IVF's and follow UOP and Scr.  2. Avoid nephrotoxic agents and renal dose medications especially vancomycin. 3. Nothing further to add, will sign off.  Please call with questions or concerns. 4. Follow up with [name] 2. DKA- per primary. Agree with r/o sepsis/infection.  3. Sepsis- on vanco and zosyn, renal dose for ARF, cultures  negative to date.  Doesn't appear to have a UTI and  CT of abdomen and pelvis was negative as well as CXR negative for infiltrate/consolidation.  Consider stopping abx. 4. CAD- per primary 5. Hypernatremia- will need hypotonic fluids as he is not taking anything by mouth 1. D5W IV until he can take po 6. Metabolic acidosis- due to #1 and #2. resolved with bicarb drip. Follow bicarb, K, and Ca levels. 7. Chronic combined systolic and diastolic CHF- volume depleted at this time. Continue with IVF's. 8. HTN- BP meds were on hold due to low BP.     Donetta Potts, MD [employer] [phone]

## 2018-03-17 NOTE — Care Management Note (Signed)
Case Management Note  Patient Details  Name: Joshua [name] MRN: [id number] Date of Birth: 07-03-1956  Subjective/Objective:   Pt admitted with DKA                  Action/Plan:   PTA independent from home.  Pt recently lost job and health insurance.  CM confirmed with PCP office of [name] that pt is still active with practice.  Practice informed CM that they allow pt to pay $50 upfront and get billed for remainder of cost, office also facilitates medication assistance.  CM was unable to make appt at lower cost clinics for [facility] however CM provided clinic info on AVS and instructed pt to contact Monday if he will not be able to afford $50 copay at PCP. Pt will likely need MATCH at discharge   Expected Discharge Date:                  Expected Discharge Plan:  Home/Self Care  In-House Referral:     Discharge planning Services  CM Consult  Post Acute Care Choice:    Choice offered to:     DME Arranged:    DME Agency:     HH Arranged:    HH Agency:     Status of Service:  In process, will continue to follow  If discussed at Long Length of Stay Meetings, dates discussed:    Additional Comments:  Maryclare Labrador, RN 03/17/2018, 2:20 PM

## 2018-03-17 NOTE — Progress Notes (Signed)
Patient is more alert today.  With PO medications, patient now wincing horribly and rolls over into fetal position, states it hurts to drink and swallow. Patient is also belching with all PO intake.  Wife now present at bedside and states patient was complaining of same prior to these events/PTA which was affecting his appetite and PO intake.  Physician paged.  RN questions need for EGD to evaluate?

## 2018-03-17 NOTE — Progress Notes (Signed)
  Speech Language Pathology Treatment: Dysphagia  Patient Details Name: Joshua [name] MRN: [id number] DOB: 1956-12-03 Today's Date: 03/17/2018 Time: 1001-1015 SLP Time Calculation (min) (ACUTE ONLY): 14 min  Assessment / Plan / Recommendation Clinical Impression  Pt is more alert and communicative today although he remains confused, needing Max cues for redirection. He has no overt signs of aspiration but has ongoing reduced awareness, evidenced by attempts to masticate puree and prolonged manipulation of solids. Given extra time and Min cues for liquid wash, he clears his oral cavity well. Frequent eructation is noted with thin liquids, which may be esophageal in nature. Recommend initiating Dys 2 diet and thin liquids, with full supervision recommended given altered mentation. SLP will f/u for tolerance and readiness to advance with good prognosis pending further improvements in cognitive status.   HPI HPI: Joshua [name] a 62 y.o.malewith medical history significant ofdiabetes, chronic kidney disease, chronic congestive heart failure, gastroparesis is brought in by his wife because of over 2 days of worsening confusion and totally being out of it. Pt presented to ED with a sugar of over 1200 with a temperature of 85 degrees rectally. Pt admitted for DKA, sepsis, and severe renal failure with urine retention and oliguria. Normal head CT. CXR revealed no active disease.      SLP Plan  Continue with current plan of care       Recommendations  Diet recommendations: Dysphagia 2 (fine chop);Thin liquid Liquids provided via: Cup;Straw Medication Administration: Whole meds with liquid Supervision: Full supervision/cueing for compensatory strategies;Staff to assist with self feeding Compensations: Minimize environmental distractions;Slow rate;Small sips/bites Postural Changes and/or Swallow Maneuvers: Seated upright 90 degrees;Upright 30-60 min after meal                Oral Care  Recommendations: Oral care BID Follow up Recommendations: (tba - none anticipated) SLP Visit Diagnosis: Dysphagia, unspecified (R13.10) Plan: Continue with current plan of care       GO                Joshua [name] 03/17/2018, 10:27 AM  Joshua [name], M.A. CCC-SLP [phone]

## 2018-03-17 NOTE — Progress Notes (Signed)
CRITICAL VALUE ALERT  Critical Value:  Phos 1.0  Date & Time Notied:  03/17/2018 @ 1219pm  Provider Notified: Yes - Dr. Irene Pap @ 1221pm  Orders Received/Actions taken: Orders received for Potassium Phosphate

## 2018-03-18 LAB — BASIC METABOLIC PANEL
Anion gap: 7 (ref 5–15)
Anion gap: 8 (ref 5–15)
BUN: 37 mg/dL — ABNORMAL HIGH (ref 6–20)
BUN: 38 mg/dL — ABNORMAL HIGH (ref 6–20)
CO2: 24 mmol/L (ref 22–32)
CO2: 27 mmol/L (ref 22–32)
Calcium: 7.3 mg/dL — ABNORMAL LOW (ref 8.9–10.3)
Calcium: 8.2 mg/dL — ABNORMAL LOW (ref 8.9–10.3)
Chloride: 103 mmol/L (ref 101–111)
Chloride: 114 mmol/L — ABNORMAL HIGH (ref 101–111)
Creatinine, Ser: 1.92 mg/dL — ABNORMAL HIGH (ref 0.61–1.24)
Creatinine, Ser: 1.93 mg/dL — ABNORMAL HIGH (ref 0.61–1.24)
GFR calc Af Amer: 42 mL/min — ABNORMAL LOW (ref >60)
GFR calc Af Amer: 42 mL/min — ABNORMAL LOW (ref >60)
GFR calc non Af Amer: 36 mL/min — ABNORMAL LOW (ref >60)
GFR calc non Af Amer: 37 mL/min — ABNORMAL LOW (ref >60)
Glucose, Bld: 233 mg/dL — ABNORMAL HIGH (ref 65–99)
Glucose, Bld: 726 mg/dL (ref 65–99)
Potassium: 3.6 mmol/L (ref 3.5–5.1)
Potassium: 3.8 mmol/L (ref 3.5–5.1)
Sodium: 134 mmol/L — ABNORMAL LOW (ref 135–145)
Sodium: 149 mmol/L — ABNORMAL HIGH (ref 135–145)

## 2018-03-18 LAB — GLUCOSE, CAPILLARY
Glucose-Capillary: 192 mg/dL — ABNORMAL HIGH (ref 65–99)
Glucose-Capillary: 195 mg/dL — ABNORMAL HIGH (ref 65–99)
Glucose-Capillary: 216 mg/dL — ABNORMAL HIGH (ref 65–99)
Glucose-Capillary: 231 mg/dL — ABNORMAL HIGH (ref 65–99)
Glucose-Capillary: 317 mg/dL — ABNORMAL HIGH (ref 65–99)
Glucose-Capillary: 347 mg/dL — ABNORMAL HIGH (ref 65–99)
Glucose-Capillary: 365 mg/dL — ABNORMAL HIGH (ref 65–99)
Glucose-Capillary: 433 mg/dL — ABNORMAL HIGH (ref 65–99)

## 2018-03-18 LAB — HEPATIC FUNCTION PANEL
ALT: 16 U/L — ABNORMAL LOW (ref 17–63)
AST: 26 U/L (ref 15–41)
Albumin: 1.9 g/dL — ABNORMAL LOW (ref 3.5–5.0)
Alkaline Phosphatase: 91 U/L (ref 38–126)
Bilirubin, Direct: 0.1 mg/dL (ref 0.1–0.5)
Indirect Bilirubin: 0.4 mg/dL (ref 0.3–0.9)
Total Bilirubin: 0.5 mg/dL (ref 0.3–1.2)
Total Protein: 4.6 g/dL — ABNORMAL LOW (ref 6.5–8.1)

## 2018-03-18 LAB — LIPASE, BLOOD: Lipase: 71 U/L — ABNORMAL HIGH (ref 11–51)

## 2018-03-18 MED ORDER — INSULIN ASPART 100 UNIT/ML ~~LOC~~ SOLN
0.0000 [IU] | Freq: Three times a day (TID) | SUBCUTANEOUS | Status: DC
  Administered 2018-03-18: 15 [IU] via SUBCUTANEOUS
  Administered 2018-03-18 – 2018-03-19 (×2): 3 [IU] via SUBCUTANEOUS
  Administered 2018-03-19 (×2): 5 [IU] via SUBCUTANEOUS
  Administered 2018-03-20: 2 [IU] via SUBCUTANEOUS
  Administered 2018-03-20: 5 [IU] via SUBCUTANEOUS
  Administered 2018-03-21: 3 [IU] via SUBCUTANEOUS
  Administered 2018-03-22: 5 [IU] via SUBCUTANEOUS
  Administered 2018-03-22: 3 [IU] via SUBCUTANEOUS

## 2018-03-18 MED ORDER — INSULIN REGULAR HUMAN 100 UNIT/ML IJ SOLN
INTRAMUSCULAR | Status: DC
Start: 2018-03-18 — End: 2018-03-18

## 2018-03-18 MED ORDER — METOCLOPRAMIDE HCL 10 MG PO TABS
10.0000 mg | ORAL_TABLET | Freq: Three times a day (TID) | ORAL | Status: DC
  Administered 2018-03-18 – 2018-03-22 (×10): 10 mg via ORAL
  Filled 2018-03-18 (×12): qty 1

## 2018-03-18 MED ORDER — INSULIN ASPART 100 UNIT/ML ~~LOC~~ SOLN: 0.0000 [IU] | SUBCUTANEOUS | Status: DC

## 2018-03-18 MED ORDER — DEXTROSE 50 % IV SOLN: 25.0000 mL | INTRAVENOUS | Status: DC | PRN

## 2018-03-18 MED ORDER — BISACODYL 5 MG PO TBEC
5.0000 mg | DELAYED_RELEASE_TABLET | Freq: Every day | ORAL | Status: DC | PRN
  Administered 2018-03-18: 5 mg via ORAL
  Filled 2018-03-18: qty 1

## 2018-03-18 MED ORDER — INSULIN GLARGINE 100 UNIT/ML ~~LOC~~ SOLN
20.0000 [IU] | Freq: Two times a day (BID) | SUBCUTANEOUS | Status: DC
  Administered 2018-03-18: 20 [IU] via SUBCUTANEOUS
  Filled 2018-03-18 (×3): qty 0.2

## 2018-03-18 MED ORDER — INSULIN ASPART 100 UNIT/ML ~~LOC~~ SOLN
6.0000 [IU] | Freq: Three times a day (TID) | SUBCUTANEOUS | Status: DC
  Administered 2018-03-18 (×2): 6 [IU] via SUBCUTANEOUS

## 2018-03-18 MED ORDER — SODIUM CHLORIDE 0.9 % IV SOLN
INTRAVENOUS | Status: DC
  Administered 2018-03-18: 02:00:00 via INTRAVENOUS

## 2018-03-18 MED ORDER — DEXTROSE-NACL 5-0.45 % IV SOLN: INTRAVENOUS | Status: DC

## 2018-03-18 MED ORDER — INSULIN ASPART 100 UNIT/ML ~~LOC~~ SOLN
0.0000 [IU] | Freq: Every day | SUBCUTANEOUS | Status: DC
  Administered 2018-03-18: 5 [IU] via SUBCUTANEOUS
  Administered 2018-03-20: 2 [IU] via SUBCUTANEOUS
  Administered 2018-03-21: 4 [IU] via SUBCUTANEOUS

## 2018-03-18 MED ORDER — SENNOSIDES 8.8 MG/5ML PO SYRP
5.0000 mL | ORAL_SOLUTION | Freq: Two times a day (BID) | ORAL | Status: DC
  Administered 2018-03-18 – 2018-03-22 (×8): 5 mL via ORAL
  Filled 2018-03-18 (×9): qty 5

## 2018-03-18 MED ORDER — INSULIN GLARGINE 100 UNIT/ML ~~LOC~~ SOLN
24.0000 [IU] | Freq: Two times a day (BID) | SUBCUTANEOUS | Status: DC
Start: 2018-03-18 — End: 2018-03-20
  Administered 2018-03-18 – 2018-03-20 (×4): 24 [IU] via SUBCUTANEOUS
  Filled 2018-03-18 (×5): qty 0.24

## 2018-03-18 MED ORDER — SODIUM CHLORIDE 0.9 % IV SOLN: INTRAVENOUS | Status: DC

## 2018-03-18 MED ORDER — INSULIN ASPART 100 UNIT/ML ~~LOC~~ SOLN
10.0000 [IU] | Freq: Three times a day (TID) | SUBCUTANEOUS | Status: DC
  Administered 2018-03-19 (×2): 10 [IU] via SUBCUTANEOUS

## 2018-03-18 MED ORDER — INSULIN REGULAR BOLUS VIA INFUSION
0.0000 [IU] | Freq: Three times a day (TID) | INTRAVENOUS | Status: DC
  Filled 2018-03-18: qty 10

## 2018-03-18 MED ORDER — SODIUM CHLORIDE 0.9 % IV SOLN
INTRAVENOUS | Status: DC
  Administered 2018-03-18: 2.6 [IU]/h via INTRAVENOUS
  Filled 2018-03-18: qty 1

## 2018-03-18 NOTE — Progress Notes (Signed)
Requested med for [facility] as pt states he hasn't had one in a few days. Bowel sounds active; no tenderness at abdomen; pt still has upper abdomen discomfort intermittently. Will continue to monitor.   [place]  Gabbriella Presswood, RN

## 2018-03-18 NOTE — Progress Notes (Signed)
PROGRESS NOTE  Joshua [name] NWG:[id number] DOB: [birth year] DOA: 03/14/2018 PCP: [name], MD  HPI/Recap of past 24 hours: Joshua [name] is a 62 y.o. male with medical history significant of diabetes, chronic kidney disease, chronic congestive heart failure, gastroparesis is brought in by his wife because of over 2 days of worsening confusion and totally being out of it.  She reports that he has had a gastroparesis flare where he has had a lot of nausea and vomiting the last several days and she thought he was taking his medication but apparently he was not.  He has not taken his meds for several days including his insulin.  Today he was extremely confused and acting very abnormal so she brought him to the ED.  Patient is found to have a sugar of over 1200 with a temperature of 85 degrees rectally.  She does not know of any fevers that he has been having.  She does not know of any diarrhea.  Patient is referred for admission for profound DKA dehydration and possible sepsis.  She denies him having any rashes.  All history is obtained from wife as patient cannot provide any history due to altered mentation.  03/15/18: seen and examined at his bedside in the ED. His wife and son are in the room. Majority of the story obtained from his wife. Patient has been noncompliant with all his medications, including insulin, asa and brillinta. Wife reports patient is a heavy drinker and stopped alcohol use about 1 and half weeks ago.   Severe renal failure on presentation with urine retention and oliguria. Obtain renal ultrasound and consult nephrology.  03/16/18: Patient seen and examined this morning at his bedside. He is alert but minimally interactive. Persistent altered mental status. Worsening hypernatremia. NG tube ordered for free water flushes. On 1/2 NS switched IV fluid to D5 1/4NS to maintain hydration and improve hypernatremia. BMP q4H. Per RN, patient has clear speech. Because of that would not  pursue further brain imaging.  03/17/18: Seen and examined at his bedside.  Patient is alert and oriented x.3.  States he is hungry.  We will transition him to long-acting insulin and allow for oral nutrition.  03/18/18: Seen and examined at his bedside.  He reports diffused abdominal pain.  Poor historian and unable to elaborate further.  Reglan started for suspected gastroparesis. Contacted [facility] for further assessment.  Assessment/Plan: Principal Problem:   Sepsis ([place]) Active Problems:   CAD S/P multiple PCIs   Chronic combined systolic and diastolic congestive heart failure (HCC)   Acute renal failure superimposed on stage 3 chronic kidney disease (HCC)   Essential hypertension   DKA (diabetic ketoacidoses) (HCC)  Acute metabolic encephalopathy most likely multifactorial 2/2 to DKA vs dehydration vs AKI vs others, resolved Anion gap Remains closed. Lantus increased from 10 units to 20 units twice daily Continue NovoLog 6 units 3 times daily and insulin sliding scale moderate Continue BMPs every 4 hours Encourage patient to take oral fluid  Type 2 diabetes uncontrolled with hyperglycemia Last hemoglobin A1c greater than 15.5 on 03/15/2018 Continue Lantus 20 units twice daily Continue NovoLog 6 units 3 times daily with meals Continue moderate insulin sliding scale  Hypovolemic hypernatremia likely secondary to severe dehydration, improving Sodium 149 from 155 yesterday Stop IV fluid and encourage patient to increase p.o. fluid intake  AKI on CKD 3, improving Baseline creatinine 1.7 Creatinine 1.92 Avoid nephrotoxic agents/hypotension/dehydration Continue to monitor urine output Repeat BMP in the morning  Severe hypophosphatemia Phosphorus of 1.0 Replete with K-Phos Repeat levels in the morning  Severe protein calorie malnutrition Albumin 2.1 Dietary consult to improve deficiencies Oral supplementation  DKA, resolved Anion gap has closed  Hypokalemia,  resolved Potassium 4.0 from 3.3 Repleted   Severe dehydration Continue to encourage p.o. fluid intake  Coronary artery disease status post PCI Denies chest pain Continue aspirin and Brilinta Continue statin Patient stopped taking his Brilinta for 5 days prior to presentation  Urinary retention status post Foley placement Renal ultrasound did not reveal any acute findings.  No obstruction or hydronephrosis mentioned Continue to monitor urine output  Leukocytosis, possibly reactive, improving WBC continues to trend down Afebrile Urinalysis negative CT abdomen and pelvis unremarkable Chest x-ray unremarkable for any lobular infiltrates DC IV vancomycin and IV Zosyn and continue to closely monitor Repeat CBC in the morning  Chronic combined systolic and diastolic heart failure Will need to continue IV hydration due to severe dehydration Monitor O2 saturation, vital signs Continue telemetry  Morbid obesity BMI 41 Weight loss outpatient  Medical noncompliance Per his wife, patient had stopped taking all his medications 5 days prior to presentation  History of alcohol abuse Per his wife patient stopped alcohol use a week and half prior to his presentation Continue folate and thiamine Alcohol withdrawal protocol    Code Status: Full  Family Communication: None at bedside. Spoke with his wife in person on 03/15/18.   Disposition Plan: Home when clinically stable.   Consultants:  nephrology  Procedures:  none   Antimicrobials:  Dc IV vanc and IV zosyn today  DVT prophylaxis:  SCDs, sq heparin 5000 u tid   Objective: Vitals:   03/18/18 0255 03/18/18 0813 03/18/18 0938 03/18/18 1231  BP: 124/69 120/64 139/68 112/79  Pulse: 77 80  84  Resp: $Remo'11 13  20  'jBovn$ Temp: 98.1 F (36.7 C) 99 F (37.2 C)  98.9 F (37.2 C)  TempSrc: Oral Oral  Oral  SpO2: 100% 99%  100%  Weight: 111.3 kg (245 lb 6 oz)     Height:        Intake/Output Summary (Last 24 hours) at  03/18/2018 1421 Last data filed at 03/18/2018 1100 Gross per 24 hour  Intake 370 ml  Output 2150 ml  Net -1780 ml   Filed Weights   03/16/18 0500 03/17/18 0500 03/18/18 0255  Weight: 110.7 kg (244 lb 0.8 oz) 110.6 kg (243 lb 13.3 oz) 111.3 kg (245 lb 6 oz)    Exam: 03/18/18   General: 62 year old African-American male well-developed well-nourished no acute distress.  Alert and oriented x3.  Cardiovascular: Regular rate and rhythm with no rubs or gallops.  No JVD or thyromegaly present.  Respiratory: CTA no wheezes or rales  Abdomen: soft non tender non distended with NBS x 4  Musculoskeletal: right forefoot amputation with a well healed scar.  Skin: As noted above  Psychiatry: Mood is appropriate for condition and setting.   Data Reviewed: CBC: Recent Labs  Lab 03/14/18 2005 03/14/18 2016 03/16/18 0748 03/17/18 1016  WBC 16.4*  --  15.6* 13.5*  NEUTROABS 12.6*  --   --   --   HGB 15.9 17.0 14.2 12.9*  HCT 52.0 50.0 40.3 39.4  MCV 96.3  --  83.1 87.0  PLT 191  --  140* 195*   Basic Metabolic Panel: Recent Labs  Lab 03/17/18 0537 03/17/18 1016 03/17/18 1658 03/17/18 2331 03/18/18 0230  NA 150* 149* 149* 134* 149*  K 3.8 4.0  4.2 3.6 3.8  CL 113* 114* 112* 103 114*  CO2 $Re'26 26 26 24 27  'pll$ GLUCOSE 136* 161* 359* 726* 233*  BUN 49* 43* 41* 38* 37*  CREATININE 1.73* 1.77* 1.84* 1.93* 1.92*  CALCIUM 8.1* 8.2* 8.0* 7.3* 8.2*  PHOS 1.0*  --  1.3*  --   --    GFR: Estimated Creatinine Clearance: 46.9 mL/min (A) (by C-G formula based on SCr of 1.92 mg/dL (H)). Liver Function Tests: Recent Labs  Lab 03/14/18 2005 03/17/18 0537 03/17/18 2331  AST 21  --  26  ALT 17  --  16*  ALKPHOS 123  --  91  BILITOT 1.9*  --  0.5  PROT 5.9*  --  4.6*  ALBUMIN 2.8* 2.1* 1.9*   Recent Labs  Lab 03/14/18 2250 03/17/18 2331  LIPASE 176* 71*   Recent Labs  Lab 03/14/18 2250  AMMONIA 32   Coagulation Profile: No results for input(s): INR, PROTIME in the last 168  hours. Cardiac Enzymes: Recent Labs  Lab 03/14/18 2057 03/15/18 0045 03/15/18 0544 03/15/18 1236  CKTOTAL 620*  --   --   --   TROPONINI  --  0.03* 0.05* 0.09*   BNP (last 3 results) No results for input(s): PROBNP in the last 8760 hours. HbA1C: No results for input(s): HGBA1C in the last 72 hours. CBG: Recent Labs  Lab 03/18/18 0128 03/18/18 0230 03/18/18 0338 03/18/18 0809 03/18/18 1228  GLUCAP 317* 231* 195* 216* 192*   Lipid Profile: No results for input(s): CHOL, HDL, LDLCALC, TRIG, CHOLHDL, LDLDIRECT in the last 72 hours. Thyroid Function Tests: Recent Labs    03/16/18 1233  TSH 0.608   Anemia Panel: No results for input(s): VITAMINB12, FOLATE, FERRITIN, TIBC, IRON, RETICCTPCT in the last 72 hours. Urine analysis:    Component Value Date/Time   COLORURINE YELLOW 03/14/2018 2145   APPEARANCEUR HAZY (A) 03/14/2018 2145   LABSPEC 1.018 03/14/2018 2145   PHURINE 5.0 03/14/2018 2145   GLUCOSEU >=500 (A) 03/14/2018 2145   HGBUR SMALL (A) 03/14/2018 2145   BILIRUBINUR NEGATIVE 03/14/2018 2145   KETONESUR 5 (A) 03/14/2018 2145   PROTEINUR 100 (A) 03/14/2018 2145   UROBILINOGEN 0.2 11/01/2011 1410   NITRITE NEGATIVE 03/14/2018 2145   LEUKOCYTESUR NEGATIVE 03/14/2018 2145   Sepsis Labs: $[email or web address]'[email or web address]$ (procalcitonin:4,lacticidven:4)  ) Recent Results (from the past 240 hour(s))  Blood Culture (routine x 2)     Status: None (Preliminary result)   Collection Time: 03/14/18  9:17 PM  Result Value Ref Range Status   Specimen Description BLOOD RIGHT HAND  Final   Special Requests IN PEDIATRIC BOTTLE Blood Culture adequate volume  Final   Culture   Final    NO GROWTH 4 DAYS Performed at [facility], [place] [address], [place], [place] [address]    Report Status PENDING  Incomplete  Urine culture     Status: None   Collection Time: 03/14/18  9:39 PM  Result Value Ref Range Status   Specimen Description URINE, CLEAN CATCH  Final   Special Requests NONE  Final    Culture   Final    NO GROWTH Performed at [facility], [place] [address], [place], [place] [address]    Report Status 03/16/2018 FINAL  Final  Blood Culture (routine x 2)     Status: None (Preliminary result)   Collection Time: 03/14/18 10:50 PM  Result Value Ref Range Status   Specimen Description BLOOD LEFT ANTECUBITAL  Final   Special  Requests   Final    BOTTLES DRAWN AEROBIC AND ANAEROBIC Blood Culture adequate volume   Culture   Final    NO GROWTH 3 DAYS Performed at [facility], [place] [address], [place], [place] [address]    Report Status PENDING  Incomplete  MRSA PCR Screening     Status: None   Collection Time: 03/15/18  7:25 AM  Result Value Ref Range Status   MRSA by PCR NEGATIVE NEGATIVE Final    Comment:        The GeneXpert MRSA Assay (FDA approved for NASAL specimens only), is one component of a comprehensive MRSA colonization surveillance program. It is not intended to diagnose MRSA infection nor to guide or monitor treatment for MRSA infections. Performed at [facility], [place] [address] [place], [place] [address]       Studies: No results found.  Scheduled Meds: . amLODipine  10 mg Oral Daily  . aspirin EC  81 mg Oral QHS  . folic acid  1 mg Oral Daily  . heparin  5,000 Units Subcutaneous Q8H  . hydrALAZINE  50 mg Oral Q8H  . insulin aspart  0-15 Units Subcutaneous TID WC  . insulin aspart  0-5 Units Subcutaneous QHS  . insulin aspart  6 Units Subcutaneous TID WC  . insulin glargine  20 Units Subcutaneous BID  . isosorbide mononitrate  120 mg Oral Daily  . metoCLOPramide  10 mg Oral TID AC  . metoprolol succinate  150 mg Oral QPM  . multivitamin with minerals  1 tablet Oral Daily  . pantoprazole  40 mg Oral Daily  . sennosides  5 mL Oral BID  . sodium chloride flush  10-40 mL Intracatheter Q12H  . sucralfate  1 g Oral TID WC & HS  . thiamine  100 mg Oral Daily  . ticagrelor  90 mg Oral BID    Continuous Infusions:     LOS: 3 days     Kayleen Memos, MD [facility] Pager [phone]  If 7PM-7AM, please contact night-coverage [email or web address] Password [email or web address] 03/18/2018, 2:21 PM

## 2018-03-18 NOTE — Progress Notes (Signed)
Pt's morning BG 216; NPO; MD paged bc no morning coverage outside of Lant[place].   [place]  Jazir Newey, RN

## 2018-03-18 NOTE — Progress Notes (Signed)
MD notified of 433 BG. Will continue to monitor.   [place]  Alvena Kiernan, RN

## 2018-03-18 NOTE — Progress Notes (Signed)
Pt's BP 85/66; asymptomatic; MD text paged; hydralazine held; will continue to monitor.   [place]  Marianela Mandrell, RN

## 2018-03-18 NOTE — Progress Notes (Signed)
BP 112/91; MAP 98; L ankle; pt sitting up eating.   Joshua [name]  Sameerah Nachtigal, RN

## 2018-03-19 DIAGNOSIS — R1013 Epigastric pain: Secondary | ICD-10-CM

## 2018-03-19 LAB — COMPREHENSIVE METABOLIC PANEL
ALT: 17 U/L (ref 17–63)
AST: 22 U/L (ref 15–41)
Albumin: 2.2 g/dL — ABNORMAL LOW (ref 3.5–5.0)
Alkaline Phosphatase: 99 U/L (ref 38–126)
Anion gap: 10 (ref 5–15)
BUN: 31 mg/dL — ABNORMAL HIGH (ref 6–20)
CO2: 25 mmol/L (ref 22–32)
Calcium: 8 mg/dL — ABNORMAL LOW (ref 8.9–10.3)
Chloride: 112 mmol/L — ABNORMAL HIGH (ref 101–111)
Creatinine, Ser: 1.87 mg/dL — ABNORMAL HIGH (ref 0.61–1.24)
GFR calc Af Amer: 44 mL/min — ABNORMAL LOW (ref >60)
GFR calc non Af Amer: 38 mL/min — ABNORMAL LOW (ref >60)
Glucose, Bld: 227 mg/dL — ABNORMAL HIGH (ref 65–99)
Potassium: 3.6 mmol/L (ref 3.5–5.1)
Sodium: 147 mmol/L — ABNORMAL HIGH (ref 135–145)
Total Bilirubin: 0.5 mg/dL (ref 0.3–1.2)
Total Protein: 5.5 g/dL — ABNORMAL LOW (ref 6.5–8.1)

## 2018-03-19 LAB — GLUCOSE, CAPILLARY
Glucose-Capillary: 184 mg/dL — ABNORMAL HIGH (ref 65–99)
Glucose-Capillary: 250 mg/dL — ABNORMAL HIGH (ref 65–99)
Glucose-Capillary: 224 mg/dL — ABNORMAL HIGH (ref 65–99)
Glucose-Capillary: 481 mg/dL — ABNORMAL HIGH (ref 65–99)

## 2018-03-19 LAB — CULTURE, BLOOD (ROUTINE X 2)
Culture: NO GROWTH
Special Requests: ADEQUATE

## 2018-03-19 LAB — CBC
HCT: 36.6 % — ABNORMAL LOW (ref 39.0–52.0)
Hemoglobin: 11.6 g/dL — ABNORMAL LOW (ref 13.0–17.0)
MCH: 27.7 pg (ref 26.0–34.0)
MCHC: 31.7 g/dL (ref 30.0–36.0)
MCV: 87.4 fL (ref 78.0–100.0)
Platelets: 119 10*3/uL — ABNORMAL LOW (ref 150–400)
RBC: 4.19 MIL/uL — ABNORMAL LOW (ref 4.22–5.81)
RDW: 15 % (ref 11.5–15.5)
WBC: 10.9 10*3/uL — ABNORMAL HIGH (ref 4.0–10.5)

## 2018-03-19 MED ORDER — INSULIN ASPART 100 UNIT/ML ~~LOC~~ SOLN
13.0000 [IU] | Freq: Three times a day (TID) | SUBCUTANEOUS | Status: DC
  Administered 2018-03-19 – 2018-03-20 (×2): 13 [IU] via SUBCUTANEOUS

## 2018-03-19 MED ORDER — INSULIN ASPART 100 UNIT/ML ~~LOC~~ SOLN
7.0000 [IU] | Freq: Once | SUBCUTANEOUS | Status: AC
  Administered 2018-03-19: 7 [IU] via SUBCUTANEOUS

## 2018-03-19 MED ORDER — GI COCKTAIL ~~LOC~~
30.0000 mL | Freq: Two times a day (BID) | ORAL | Status: DC | PRN
  Administered 2018-03-19: 30 mL via ORAL
  Filled 2018-03-19: qty 30

## 2018-03-19 NOTE — Progress Notes (Signed)
PROGRESS NOTE  Joshua [name]:[id number] DOB: 09-Apr-1956 DOA: 03/14/2018 PCP: [name], MD  HPI/Recap of past 24 hours: Joshua [name] is a 62 y.o. male with medical history significant of diabetes, chronic kidney disease, chronic congestive heart failure, gastroparesis is brought in by his wife because of over 2 days of worsening confusion and totally being out of it.  She reports that he has had a gastroparesis flare where he has had a lot of nausea and vomiting the last several days and she thought he was taking his medication but apparently he was not.  He has not taken his meds for several days including his insulin.  Today he was extremely confused and acting very abnormal so she brought him to the ED.  Patient is found to have a sugar of over 1200 with a temperature of 85 degrees rectally.  She does not know of any fevers that he has been having.  She does not know of any diarrhea.  Patient is referred for admission for profound DKA dehydration and possible sepsis.  She denies him having any rashes.  All history is obtained from wife as patient cannot provide any history due to altered mentation.  03/15/18: seen and examined at his bedside in the ED. His wife and son are in the room. Majority of the story obtained from his wife. Patient has been noncompliant with all his medications, including insulin, asa and brillinta. Wife reports patient is a heavy drinker and stopped alcohol use about 1 and half weeks ago.   Severe renal failure on presentation with urine retention and oliguria. Obtain renal ultrasound and consult nephrology.  03/16/18: Patient seen and examined this morning at his bedside. He is alert but minimally interactive. Persistent altered mental status. Worsening hypernatremia. NG tube ordered for free water flushes. On 1/2 NS switched IV fluid to D5 1/4NS to maintain hydration and improve hypernatremia. BMP q4H. Per RN, patient has clear speech. Because of that would not  pursue further brain imaging.  03/17/18: Seen and examined at his bedside.  Patient is alert and oriented x.3.  States he is hungry.  We will transition him to long-acting insulin and allow for oral nutrition.  03/18/18: Seen and examined at his bedside.  He reports diffused abdominal pain.  Poor historian and unable to elaborate further.  Reglan started for suspected gastroparesis. Contacted [facility] for further assessment.  03/19/18: seen and examined at his bedside. Complaints of food getting stuck in his throat. Reports this is chronic. GI will see in the am. NPO after midnight.  Assessment/Plan: Principal Problem:   Sepsis ([place]) Active Problems:   CAD S/P multiple PCIs   Chronic combined systolic and diastolic congestive heart failure (HCC)   Acute renal failure superimposed on stage 3 chronic kidney disease (HCC)   Essential hypertension   DKA (diabetic ketoacidoses) (HCC)  Acute metabolic encephalopathy most likely multifactorial 2/2 to DKA vs dehydration vs AKI vs others, resolved Anion gap Remains closed. lantus increased from 20 U to 24 U BID Novolog increased from 6 U to 13 U TID Continue ISS moderate scale Encourage po fluid intake BMP am  Type 2 diabetes uncontrolled with hyperglycemia, persistent hyperglycemia Last hemoglobin A1c greater than 15.5 on 03/15/2018 C/w lantus 24 U BID C/w Novolog 13U TID Continue moderate scale ISS Avoid hypoglycemia  Chronic dyspepsia GI consult NPO after midnight Possible EGD Continue protonix GI cocktail prn  Hypovolemic hypernatremia likely secondary to severe dehydration, improving Na+ 147 from Sodium  149 from 155 yesterday Encourage po fluid intake  AKI on CKD 3, improving Baseline creatinine 1.7 Cr 1.87 from 1.92 from Creatinine 1.93 Avoid nephrotoxic agents/hypotension/dehydration Continue to monitor urine output Repeat BMP in the morning  Severe hypophosphatemia Phosphorus of 1.0 Replete with K-Phos Repeat levels in  the morning  Severe protein calorie malnutrition,  improving Albumin 2.2 from Albumin 2.1 Dietary consult to improve deficiencies Oral supplementation  DKA, resolved Anion gap has closed  Hypokalemia, resolved Potassium 4.0 from 3.3 Repleted   Severe dehydration Continue to encourage p.o. fluid intake  Coronary artery disease status post PCI Denies chest pain Continue aspirin and Brilinta Continue statin Patient stopped taking his Brilinta for 5 days prior to presentation  Urinary retention status post Foley placement Renal ultrasound did not reveal any acute findings.  No obstruction or hydronephrosis mentioned Continue to monitor urine output Remove foley cath today  Leukocytosis, possibly reactive, resolved Wbc 10 from 13 Afebrile Urinalysis negative CT abdomen and pelvis unremarkable Chest x-ray unremarkable for any lobular infiltrates DC IV vancomycin and IV Zosyn and continue to closely monitor  Chronic combined systolic and diastolic heart failure Will need to continue IV hydration due to severe dehydration Monitor O2 saturation, vital signs Continue telemetry Last 2D echo 07/31/15 LVEF 55-60%  Morbid obesity BMI 41 Weight loss outpatient  Medical noncompliance Per his wife, patient had stopped taking all his medications 5 days prior to presentation  History of alcohol abuse Per his wife patient stopped alcohol use a week and half prior to his presentation Continue folate and thiamine Alcohol withdrawal protocol    Code Status: Full  Family Communication: None at bedside. Spoke with his wife in person on 03/15/18.   Disposition Plan: Home when clinically stable.   Consultants:  nephrology  Procedures:  none   Antimicrobials:  Dc IV vanc and IV zosyn   DVT prophylaxis:  SCDs, sq heparin 5000 u tid   Objective: Vitals:   03/18/18 2325 03/19/18 0342 03/19/18 0747 03/19/18 1124  BP: 108/65 128/71 (!) 149/83 118/84  Pulse: 82 78 83 89    Resp: (!) 21 (!) $Remo'22 19 18  'iPaHZ$ Temp: 98.2 F (36.8 C) (!) 97.5 F (36.4 C) 98.3 F (36.8 C) 98.7 F (37.1 C)  TempSrc: Oral Axillary Oral Oral  SpO2: 100% 98% 98% 100%  Weight:  110.4 kg (243 lb 6.2 oz)    Height:        Intake/Output Summary (Last 24 hours) at 03/19/2018 1237 Last data filed at 03/19/2018 1125 Gross per 24 hour  Intake 720 ml  Output 1251 ml  Net -531 ml   Filed Weights   03/17/18 0500 03/18/18 0255 03/19/18 0342  Weight: 110.6 kg (243 lb 13.3 oz) 111.3 kg (245 lb 6 oz) 110.4 kg (243 lb 6.2 oz)    Exam: 03/19/18  General: 62 yo AAM WD WN NAD A&O x 3  Cardiovascular: RRR no rubs or gallops. No JVD or thyromegaly.  Respiratory: CTA no wheezes or rales  Abdomen: soft non tender non distended with NBS x 4  Musculoskeletal: right forefoot amputation with a well healed scar.  Skin: As noted above  Psychiatry: Mood is appropriate for condition and setting.   Data Reviewed: CBC: Recent Labs  Lab 03/14/18 2005 03/14/18 2016 03/16/18 0748 03/17/18 1016 03/19/18 0500  WBC 16.4*  --  15.6* 13.5* 10.9*  NEUTROABS 12.6*  --   --   --   --   HGB 15.9 17.0 14.2 12.9* 11.6*  HCT 52.0 50.0 40.3 39.4 36.6*  MCV 96.3  --  83.1 87.0 87.4  PLT 191  --  140* 127* 161*   Basic Metabolic Panel: Recent Labs  Lab 03/17/18 0537 03/17/18 1016 03/17/18 1658 03/17/18 2331 03/18/18 0230 03/19/18 0500  NA 150* 149* 149* 134* 149* 147*  K 3.8 4.0 4.2 3.6 3.8 3.6  CL 113* 114* 112* 103 114* 112*  CO2 $Re'26 26 26 24 27 25  'GzU$ GLUCOSE 136* 161* 359* 726* 233* 227*  BUN 49* 43* 41* 38* 37* 31*  CREATININE 1.73* 1.77* 1.84* 1.93* 1.92* 1.87*  CALCIUM 8.1* 8.2* 8.0* 7.3* 8.2* 8.0*  PHOS 1.0*  --  1.3*  --   --   --    GFR: Estimated Creatinine Clearance: 47.9 mL/min (A) (by C-G formula based on SCr of 1.87 mg/dL (H)). Liver Function Tests: Recent Labs  Lab 03/14/18 2005 03/17/18 0537 03/17/18 2331 03/19/18 0500  AST 21  --  26 22  ALT 17  --  16* 17  ALKPHOS 123  --   91 99  BILITOT 1.9*  --  0.5 0.5  PROT 5.9*  --  4.6* 5.5*  ALBUMIN 2.8* 2.1* 1.9* 2.2*   Recent Labs  Lab 03/14/18 2250 03/17/18 2331  LIPASE 176* 71*   Recent Labs  Lab 03/14/18 2250  AMMONIA 32   Coagulation Profile: No results for input(s): INR, PROTIME in the last 168 hours. Cardiac Enzymes: Recent Labs  Lab 03/14/18 2057 03/15/18 0045 03/15/18 0544 03/15/18 1236  CKTOTAL 620*  --   --   --   TROPONINI  --  0.03* 0.05* 0.09*   BNP (last 3 results) No results for input(s): PROBNP in the last 8760 hours. HbA1C: No results for input(s): HGBA1C in the last 72 hours. CBG: Recent Labs  Lab 03/18/18 0809 03/18/18 1228 03/18/18 1702 03/18/18 2118 03/19/18 0823  GLUCAP 216* 192* 433* 365* 184*   Lipid Profile: No results for input(s): CHOL, HDL, LDLCALC, TRIG, CHOLHDL, LDLDIRECT in the last 72 hours. Thyroid Function Tests: No results for input(s): TSH, T4TOTAL, FREET4, T3FREE, THYROIDAB in the last 72 hours. Anemia Panel: No results for input(s): VITAMINB12, FOLATE, FERRITIN, TIBC, IRON, RETICCTPCT in the last 72 hours. Urine analysis:    Component Value Date/Time   COLORURINE YELLOW 03/14/2018 2145   APPEARANCEUR HAZY (A) 03/14/2018 2145   LABSPEC 1.018 03/14/2018 2145   PHURINE 5.0 03/14/2018 2145   GLUCOSEU >=500 (A) 03/14/2018 2145   HGBUR SMALL (A) 03/14/2018 2145   BILIRUBINUR NEGATIVE 03/14/2018 2145   KETONESUR 5 (A) 03/14/2018 2145   PROTEINUR 100 (A) 03/14/2018 2145   UROBILINOGEN 0.2 11/01/2011 1410   NITRITE NEGATIVE 03/14/2018 2145   LEUKOCYTESUR NEGATIVE 03/14/2018 2145   Sepsis Labs: $[email or web address]$ (procalcitonin:4,lacticidven:4)  ) Recent Results (from the past 240 hour(s))  Blood Culture (routine x 2)     Status: None (Preliminary result)   Collection Time: 03/14/18  9:17 PM  Result Value Ref Range Status   Specimen Description BLOOD RIGHT HAND  Final   Special Requests IN PEDIATRIC BOTTLE Blood Culture adequate volume  Final    Culture   Final    NO GROWTH 4 DAYS Performed at [facility], [place] [address] [place], [place] [address]    Report Status PENDING  Incomplete  Urine culture     Status: None   Collection Time: 03/14/18  9:39 PM  Result Value Ref Range Status   Specimen Description URINE, CLEAN CATCH  Final  Special Requests NONE  Final   Culture   Final    NO GROWTH Performed at [facility], [place] [address] [place], [place] [address]    Report Status 03/16/2018 FINAL  Final  Blood Culture (routine x 2)     Status: None (Preliminary result)   Collection Time: 03/14/18 10:50 PM  Result Value Ref Range Status   Specimen Description BLOOD LEFT ANTECUBITAL  Final   Special Requests   Final    BOTTLES DRAWN AEROBIC AND ANAEROBIC Blood Culture adequate volume   Culture   Final    NO GROWTH 3 DAYS Performed at [facility], [place] [address] [place], [place] [address]    Report Status PENDING  Incomplete  MRSA PCR Screening     Status: None   Collection Time: 03/15/18  7:25 AM  Result Value Ref Range Status   MRSA by PCR NEGATIVE NEGATIVE Final    Comment:        The GeneXpert MRSA Assay (FDA approved for NASAL specimens only), is one component of a comprehensive MRSA colonization surveillance program. It is not intended to diagnose MRSA infection nor to guide or monitor treatment for MRSA infections. Performed at [facility], [place] [address] [place], [place] [address]       Studies: No results found.  Scheduled Meds: . amLODipine  10 mg Oral Daily  . aspirin EC  81 mg Oral QHS  . folic acid  1 mg Oral Daily  . heparin  5,000 Units Subcutaneous Q8H  . insulin aspart  0-15 Units Subcutaneous TID WC  . insulin aspart  0-5 Units Subcutaneous QHS  . insulin aspart  10 Units Subcutaneous TID AC  . insulin glargine  24 Units Subcutaneous BID  . isosorbide mononitrate  120 mg Oral Daily  . metoCLOPramide  10 mg Oral TID AC  . metoprolol succinate  150 mg Oral QPM    . multivitamin with minerals  1 tablet Oral Daily  . pantoprazole  40 mg Oral Daily  . sennosides  5 mL Oral BID  . sodium chloride flush  10-40 mL Intracatheter Q12H  . sucralfate  1 g Oral TID WC & HS  . thiamine  100 mg Oral Daily  . ticagrelor  90 mg Oral BID    Continuous Infusions:    LOS: 4 days     Kayleen Memos, MD [facility] Pager [phone]  If 7PM-7AM, please contact night-coverage [email or web address] Password [facility] 03/19/2018, 12:37 PM

## 2018-03-19 NOTE — Progress Notes (Signed)
Patient continues to have signifcant difficulty swallowing beverages - c/o pain "as if you bit off too much and couldn't get it down"; continue to monitor.

## 2018-03-20 ENCOUNTER — Inpatient Hospital Stay (HOSPITAL_COMMUNITY): Payer: Medicaid Other

## 2018-03-20 LAB — CBC
HCT: 37.8 % — ABNORMAL LOW (ref 39.0–52.0)
Hemoglobin: 11.8 g/dL — ABNORMAL LOW (ref 13.0–17.0)
MCH: 27.5 pg (ref 26.0–34.0)
MCHC: 31.2 g/dL (ref 30.0–36.0)
MCV: 88.1 fL (ref 78.0–100.0)
Platelets: 127 10*3/uL — ABNORMAL LOW (ref 150–400)
RBC: 4.29 MIL/uL (ref 4.22–5.81)
RDW: 15 % (ref 11.5–15.5)
WBC: 10.6 10*3/uL — ABNORMAL HIGH (ref 4.0–10.5)

## 2018-03-20 LAB — COMPREHENSIVE METABOLIC PANEL
ALT: 18 U/L (ref 17–63)
AST: 25 U/L (ref 15–41)
Albumin: 2.3 g/dL — ABNORMAL LOW (ref 3.5–5.0)
Alkaline Phosphatase: 107 U/L (ref 38–126)
Anion gap: 11 (ref 5–15)
BUN: 30 mg/dL — ABNORMAL HIGH (ref 6–20)
CO2: 23 mmol/L (ref 22–32)
Calcium: 8.1 mg/dL — ABNORMAL LOW (ref 8.9–10.3)
Chloride: 109 mmol/L (ref 101–111)
Creatinine, Ser: 1.75 mg/dL — ABNORMAL HIGH (ref 0.61–1.24)
GFR calc Af Amer: 47 mL/min — ABNORMAL LOW (ref >60)
GFR calc non Af Amer: 41 mL/min — ABNORMAL LOW (ref >60)
Glucose, Bld: 145 mg/dL — ABNORMAL HIGH (ref 65–99)
Potassium: 3.4 mmol/L — ABNORMAL LOW (ref 3.5–5.1)
Sodium: 143 mmol/L (ref 135–145)
Total Bilirubin: 0.4 mg/dL (ref 0.3–1.2)
Total Protein: 5.6 g/dL — ABNORMAL LOW (ref 6.5–8.1)

## 2018-03-20 LAB — GLUCOSE, CAPILLARY
Glucose-Capillary: 131 mg/dL — ABNORMAL HIGH (ref 65–99)
Glucose-Capillary: 207 mg/dL — ABNORMAL HIGH (ref 65–99)
Glucose-Capillary: 103 mg/dL — ABNORMAL HIGH (ref 65–99)
Glucose-Capillary: 62 mg/dL — ABNORMAL LOW (ref 65–99)
Glucose-Capillary: 207 mg/dL — ABNORMAL HIGH (ref 65–99)

## 2018-03-20 LAB — CULTURE, BLOOD (ROUTINE X 2)
Culture: NO GROWTH
Special Requests: ADEQUATE

## 2018-03-20 LAB — MAGNESIUM: Magnesium: 2.3 mg/dL (ref 1.7–2.4)

## 2018-03-20 LAB — PHOSPHORUS: Phosphorus: 2.2 mg/dL — ABNORMAL LOW (ref 2.5–4.6)

## 2018-03-20 MED ORDER — SODIUM CHLORIDE 0.9 % IV SOLN
INTRAVENOUS | Status: DC
  Administered 2018-03-20: 20:00:00 via INTRAVENOUS

## 2018-03-20 MED ORDER — INSULIN ASPART 100 UNIT/ML ~~LOC~~ SOLN
13.0000 [IU] | Freq: Three times a day (TID) | SUBCUTANEOUS | Status: DC
  Administered 2018-03-22 (×2): 13 [IU] via SUBCUTANEOUS

## 2018-03-20 MED ORDER — INSULIN GLARGINE 100 UNIT/ML ~~LOC~~ SOLN
24.0000 [IU] | Freq: Two times a day (BID) | SUBCUTANEOUS | Status: DC
Start: 2018-03-22 — End: 2018-03-22
  Administered 2018-03-22: 24 [IU] via SUBCUTANEOUS
  Filled 2018-03-20 (×2): qty 0.24

## 2018-03-20 MED ORDER — POTASSIUM CHLORIDE CRYS ER 20 MEQ PO TBCR
40.0000 meq | EXTENDED_RELEASE_TABLET | Freq: Two times a day (BID) | ORAL | Status: AC
  Administered 2018-03-20 (×2): 40 meq via ORAL
  Filled 2018-03-20 (×2): qty 2

## 2018-03-20 MED ORDER — POLYETHYLENE GLYCOL 3350 17 G PO PACK
17.0000 g | PACK | Freq: Every day | ORAL | Status: DC
  Administered 2018-03-20 – 2018-03-22 (×2): 17 g via ORAL
  Filled 2018-03-20 (×3): qty 1

## 2018-03-20 NOTE — H&P (View-Only) (Signed)
Referring Provider:  Dr. Billey [name]. Joshua [name] ([facility]) Primary Care Physician:  Joshua Manson, MD Primary Gastroenterologist:  Dr. Doy [name]  Reason for Consultation: Dysphagia  HPI: Joshua [name] is a 62 y.o. male with a history of coronary disease status post MI and stent placement, diabetes, obesity, obstructive sleep apnea and chronic kidney disease admitted 6 days ago with DKA and severe metabolic disarray, from which he is making a nice recovery.  In fact, he was medically otherwise ready for discharge, but the patient was complaining of significant intermittent dysphagia symptoms which he thought might actually be interfering with his ability to take medications, so we were asked to see the patient.  The patient is known to my partner, Dr. Doy [name], who removed a 2 cm adenomatous polyp from him 6 months ago, but otherwise has not followed him on a regular basis.  With that background, the patient gives a long-standing (perhaps 20-year) history of intermittent dysphagia  to solid foods, with food causing painful obstruction in the distal esophagus, lasting minutes or sometimes quite a bit longer.  More severe episodes of this might only occur several times a year, but at other times, the patient has more mild, transient "pausing" of food.  There is a past history of acid reflux symptoms which have been quite well controlled by the use of pantoprazole 40 mg daily.  A barium swallow today showed irregular narrowing of the distal esophagus with hang up of the barium tablet at that location.  I have reviewed the films; there does not appear to be a high-grade stricture but there is some mucosal irregularity.  The patient has had recent significant weight loss but he attributes this to situational circumstances such as the loss of a job and stress associated with financial concerns.  From the lower tract perspective, he had colonoscopy as noted above, about 6 months ago; he does  tend toward constipation, typically just having 1 or 2 bowel movements per week, but not typically requiring use of a laxative.  He is not maintained on anything for laxation.     Past Medical History:  Diagnosis Date  . Anemia   . Chronic combined systolic and diastolic CHF (congestive heart failure) (HCC)    a. 07/2015 Echo: EF 45-50%, mod LVH, mid apical/antsept AK, Gr 1 DD.  [name] CKD (chronic kidney disease), stage III (HCC)   . Coronary artery disease    a. 2012 s/p MI/PCI: s/p DES to mid/dist RCA and overlapping DES to LAD;  b. 07/2015 Cath: LAD 10% ISR, D1 40(jailed), LCX 68m, OM2 30, OM3 30, RCA 28m ISR, 10d ISR; c. 07/2016 NSTEMI/PCI: LAD 85ost/p/m (3.5x20 Synergy DES overlapping prior stent), D1 40, LCX 44m, OM2 95 (2.75x16 Synergy DES), OM3 30, RCA 5m, 10d.  . Depression   . Diabetic gastroparesis (HCC)   . DKA (diabetic ketoacidoses) (HCC) 03/14/2018  . GERD (gastroesophageal reflux disease)   . Gout   . Gout   . Heart murmur   . Hyperlipemia   . Hypertensive heart disease   . Ischemic cardiomyopathy    a. 07/2015 Echo: EF 45-50%, mod LVH, mid-apicalanteroseptal AK, Gr1 DD.  . Morbid obesity (HCC)   . Myocardial infarction (HCC) 2012  . OSA on CPAP   . Osteomyelitis (HCC)    a. 07/2016 MTP joint of R great toe s/p transmetatarsal amputation.  . Polyneuropathy in diabetes(357.2)   . Pulmonary sarcoidosis (HCC)    "problems w/it years ago; no problems anymore" (07/03/2014)  .  Rectal bleeding   . Type II diabetes mellitus ([place])     Past Surgical History:  Procedure Laterality Date  . AMPUTATION Right 07/24/2016   Procedure: TRANSMETATARSAL FOOT AMPUTATION;  Surgeon: Joshua Minion, MD;  Location: [facility];  Service: Orthopedics;  Laterality: Right;  . BREAST LUMPECTOMY Right ~ 2000   "benign"  . CARDIAC CATHETERIZATION N/A 07/30/2015   Procedure: Left Heart Cath and Coronary Angiography;  Surgeon: Joshua Blanks, MD;  Location: [facility];  Service:  Cardiovascular;  Laterality: N/A;  . CARDIAC CATHETERIZATION N/A 07/19/2016   Procedure: Left Heart Cath and Coronary Angiography;  Surgeon: Joshua Crome, MD;  Location: [facility];  Service: Cardiovascular;  Laterality: N/A;  . CARDIAC CATHETERIZATION N/A 07/29/2016   Procedure: Coronary Stent Intervention;  Surgeon: Joshua Crome, MD;  Location: [facility];  Service: Cardiovascular;  Laterality: N/A;  . CARDIAC CATHETERIZATION N/A 07/29/2016   Procedure: Intravascular Ultrasound/IVUS;  Surgeon: Joshua Crome, MD;  Location: [facility];  Service: Cardiovascular;  Laterality: N/A;  . CATARACT EXTRACTION W/ INTRAOCULAR LENS  IMPLANT, BILATERAL Bilateral 2014-2015  . COLONOSCOPY WITH PROPOFOL N/A 08/22/2017   Procedure: COLONOSCOPY WITH PROPOFOL;  Surgeon: Joshua Corner, MD;  Location: [facility];  Service: Endoscopy;  Laterality: N/A;  . CORONARY ANGIOPLASTY WITH STENT PLACEMENT  10/31/2011   30 % MID ATRIOVENTRICULAR GROOVE CX STENOSIS. 90 % MID RCA.PTA TO LAD/STENTING OF TANDEM 60-70%. LAD STENOSIS 99% REDUCED TO 0%.3.0 X 32MM PROMUS DES POSTDILATED TO 3.25MM.  [name] CORONARY ANGIOPLASTY WITH STENT PLACEMENT  07/03/2014   "2"  . I&D EXTREMITY Right 10/30/2013   Procedure: IRRIGATION AND DEBRIDEMENT RIGHT SMALL FINGER POSSIBLE SECONDARY WOUND CLOSURE;  Surgeon: Joshua Amor, MD;  Location: [facility];  Service: Orthopedics;  Laterality: Right;  Joshua [name] is available after 4pm  . I&D EXTREMITY Right 11/01/2013   Procedure:  IRRIGATION AND DEBRIDEMENT RIGHT  PROXIMAL PHALANGEAL LEVEL AMPUTATION;  Surgeon: Joshua Amor, MD;  Location: [facility];  Service: Orthopedics;  Laterality: Right;  . INCISION AND DRAINAGE Right 10/28/2013   Procedure: INCISION AND DRAINAGE Right small finger;  Surgeon: Joshua Amor, MD;  Location: [facility];  Service: Orthopedics;  Laterality: Right;  . LEFT HEART CATH Left 11/03/2011   Procedure: LEFT HEART CATH;  Surgeon: Joshua Sine, MD;   Location: [facility];  Service: Cardiovascular;  Laterality: Left;  . LEFT HEART CATHETERIZATION WITH CORONARY ANGIOGRAM N/A 07/03/2014   Procedure: LEFT HEART CATHETERIZATION WITH CORONARY ANGIOGRAM;  Surgeon: [name], MD;  Location: [facility];  Service: Cardiovascular;  Laterality: N/A;  . PERCUTANEOUS CORONARY STENT INTERVENTION (PCI-S) Left 11/03/2011   Procedure: PERCUTANEOUS CORONARY STENT INTERVENTION (PCI-S);  Surgeon: Joshua Sine, MD;  Location: [facility] CATH LAB;  Service: Cardiovascular;  Laterality: Left;  . TOE AMPUTATION Right ~ 2010   "2nd toe"  . TOE AMPUTATION Right 2012   "3rd toe"    Prior to Admission medications   Medication Sig Start Date End Date Taking? Authorizing Provider  acetaminophen (TYLENOL) 325 MG tablet Take 2 tablets (650 mg total) by mouth every 4 (four) hours as needed for headache or mild pain. 08/03/16  Yes Isaiah Serge, NP  acetaminophen (TYLENOL) 650 MG CR tablet Take 1,300 mg by mouth daily as needed for pain.   Yes [provider]  allopurinol (ZYLOPRIM) 300 MG tablet Take 300 mg by mouth daily.    Yes [provider]  amLODipine (NORVASC) 10 MG tablet Take 1 tablet (10 mg total) by mouth daily. 10/07/17  Yes Joshua Sine, MD  aspirin EC 81 MG tablet Take 81 mg by mouth at bedtime.    Yes [provider]  BRILINTA 90 MG TABS tablet TAKE 1 TABLET BY MOUTH TWICE A DAY Patient taking differently: TAKE 1 TABLET (90 MG) BY MOUTH TWICE A DAY 03/07/17  Yes Joshua Sine, MD  hydrALAZINE (APRESOLINE) 50 MG tablet Take 50 mg by mouth every 8 (eight) hours.  09/05/17  Yes [provider]  hydrochlorothiazide (MICROZIDE) 12.5 MG capsule Take 1 capsule (12.5 mg total) by mouth daily. 11/29/17 03/16/18 Yes Joshua Sine, MD  insulin NPH Human (HUMULIN N,NOVOLIN N) 100 UNIT/ML injection Inject 20 Units into the skin 2 (two) times daily.    Yes [provider]  insulin regular (NOVOLIN R,HUMULIN R) 100  units/mL injection Inject 10-20 Units into the skin daily with supper.    Yes [provider]  isosorbide mononitrate (IMDUR) 120 MG 24 hr tablet Take 1 tablet (120 mg total) by mouth daily. Patient taking differently: Take 120 mg by mouth every evening.  07/29/17  Yes Joshua Sine, MD  metoprolol succinate (TOPROL XL) 100 MG 24 hr tablet Take 1 & 1/2  Tablets daily Patient taking differently: Take 150 mg by mouth every evening.  07/29/17  Yes Joshua Sine, MD  nitroGLYCERIN (NITROSTAT) 0.4 MG SL tablet Place 1 tablet (0.4 mg total) under the tongue every 5 (five) minutes as needed for chest pain. 11/29/17  Yes Joshua Sine, MD  pantoprazole (PROTONIX) 40 MG tablet Take 1 tablet (40 mg total) by mouth daily. Patient taking differently: Take 40 mg by mouth every evening.  08/13/16  Yes Love, Ivan Anchors, PA-C  rosuvastatin (CRESTOR) 40 MG tablet Take 1 tablet (40 mg total) by mouth daily. 03/23/17  Yes Joshua Sine, MD    Current Facility-Administered Medications  Medication Dose Route Frequency Provider Last Rate Last Dose  . amLODipine (NORVASC) tablet 10 mg  10 mg Oral Daily Irene Pap N, DO   10 mg at 03/20/18 0959  . aspirin EC tablet 81 mg  81 mg Oral QHS Phillips Grout, MD   81 mg at 03/19/18 2140  . bisacodyl (DULCOLAX) EC tablet 5 mg  5 mg Oral Daily PRN Irene Pap N, DO   5 mg at 03/18/18 1252  . dextrose 50 % solution 25 mL  25 mL Intravenous PRN Kirby-Graham, Karsten Fells, NP      . folic acid (FOLVITE) tablet 1 mg  1 mg Oral Daily George, Carole N, DO   1 mg at 03/20/18 0959  . gi cocktail (Maalox,Lidocaine,Donnatal)  30 mL Oral BID PRN Irene Pap N, DO   30 mL at 03/19/18 1718  . heparin injection 5,000 Units  5,000 Units Subcutaneous Q8H Phillips Grout, MD   5,000 Units at 03/20/18 1426  . insulin aspart (novoLOG) injection 0-15 Units  0-15 Units Subcutaneous TID WC Irene Pap N, DO   5 Units at 03/20/18 1223  . insulin aspart (novoLOG) injection 0-5 Units  0-5  Units Subcutaneous QHS Kayleen Memos, DO   5 Units at 03/18/18 2145  . insulin aspart (novoLOG) injection 13 Units  13 Units Subcutaneous TID AC Kayleen Memos, DO   13 Units at 03/20/18 [phone]  . insulin glargine (LANTUS) injection 24 Units  24 Units Subcutaneous BID Kayleen Memos, DO  24 Units at 03/20/18 1000  . isosorbide mononitrate (IMDUR) 24 hr tablet 120 mg  120 mg Oral Daily Irene Pap N, DO   120 mg at 03/20/18 0959  . metoCLOPramide (REGLAN) tablet 10 mg  10 mg Oral TID AC Hall, Carole N, DO   10 mg at 03/20/18 1722  . metoprolol succinate (TOPROL-XL) 24 hr tablet 150 mg  150 mg Oral QPM Hall, Carole N, DO   150 mg at 03/20/18 1722  . multivitamin with minerals tablet 1 tablet  1 tablet Oral Daily Irene Pap N, DO   1 tablet at 03/20/18 0959  . pantoprazole (PROTONIX) EC tablet 40 mg  40 mg Oral Daily Derrill Kay A, MD   40 mg at 03/20/18 0957  . polyethylene glycol (MIRALAX / GLYCOLAX) packet 17 g  17 g Oral Daily Irene Pap N, DO   17 g at 03/20/18 0957  . potassium chloride SA (K-DUR,KLOR-CON) CR tablet 40 mEq  40 mEq Oral BID Irene Pap N, DO   40 mEq at 03/20/18 0959  . sennosides (SENOKOT) 8.8 MG/5ML syrup 5 mL  5 mL Oral BID Irene Pap N, DO   5 mL at 03/20/18 0959  . sodium chloride flush (NS) 0.9 % injection 10-40 mL  10-40 mL Intracatheter Q12H Hall, Carole N, DO   20 mL at 03/20/18 1224  . sodium chloride flush (NS) 0.9 % injection 10-40 mL  10-40 mL Intracatheter PRN Irene Pap N, DO      . sucralfate (CARAFATE) 1 GM/10ML suspension 1 g  1 g Oral TID WC & HS Hall, Carole N, DO   1 g at 03/20/18 1722  . thiamine (VITAMIN B-1) tablet 100 mg  100 mg Oral Daily Irene Pap N, DO   100 mg at 03/20/18 0959  . ticagrelor (BRILINTA) tablet 90 mg  90 mg Oral BID Phillips Grout, MD   90 mg at 03/20/18 3875    Allergies as of 03/14/2018 - Review Complete 03/14/2018  Allergen Reaction Noted  . Chlorthalidone Other (See Comments) 05/22/2017    Family History   Problem Relation Age of Onset  . Heart attack Maternal Aunt   . Diabetes Maternal Grandmother   . Hypertension Maternal Grandmother     Social History   Socioeconomic History  . Marital status: Married    Spouse name: Not on file  . Number of children: Not on file  . Years of education: Not on file  . Highest education level: Not on file  Occupational History  . Not on file  Social Needs  . Financial resource strain: Not on file  . Food insecurity:    Worry: Not on file    Inability: Not on file  . Transportation needs:    Medical: Not on file    Non-medical: Not on file  Tobacco Use  . Smoking status: Never Smoker  . Smokeless tobacco: Never Used  Substance and Sexual Activity  . Alcohol use: Yes    Comment: social   . Drug use: No  . Sexual activity: Yes  Lifestyle  . Physical activity:    Days per week: Not on file    Minutes per session: Not on file  . Stress: Not on file  Relationships  . Social connections:    Talks on phone: Not on file    Gets together: Not on file    Attends religious service: Not on file    Active member of club or organization: Not on  file    Attends meetings of clubs or organizations: Not on file    Relationship status: Not on file  . Intimate partner violence:    Fear of current or ex partner: Not on file    Emotionally abused: Not on file    Physically abused: Not on file    Forced sexual activity: Not on file  Other Topics Concern  . Not on file  Social History Narrative  . Not on file    Review of Systems: See history of present illness  Physical Exam: Vital signs in last 24 hours: Temp:  [98.7 F (37.1 C)-99.5 F (37.5 C)] 99.5 F (37.5 C) (04/08 1638) Pulse Rate:  [82-96] 96 (04/08 1722) Resp:  [15-24] 16 (04/08 1638) BP: (105-148)/(67-106) 112/67 (04/08 1638) SpO2:  [98 %-100 %] 98 % (04/08 0802) Weight:  [111.9 kg (246 lb 11.1 oz)] 111.9 kg (246 lb 11.1 oz) (04/08 0525) Last BM Date: 03/18/18 General:    Alert,  Well-developed, well-nourished, somewhat overweight, pleasant and cooperative in NAD Head:  Normocephalic and atraumatic. Eyes:  Sclera clear, no icterus.   Conjunctiva pink. Mouth:   No ulcerations or lesions.  Oropharynx pink & moist. Neck:   No masses or thyromegaly. Lungs:  Clear throughout to auscultation.   No wheezes, crackles, or rhonchi. No evident respiratory distress. Heart:   Regular rate and rhythm; no murmurs, clicks, rubs,  or gallops. Abdomen:    Moderately adipose, no tympany, no overt tenderness or guarding  Msk:   Symmetrical without gross deformities but s/p amputation of part of his right little finger. Extremities:   Without clubbing or cyanosis, but with 2+ tibial edema. Neurologic:  Alert and coherent;  grossly normal neurologically. Skin:  Intact without significant lesions or rashes. Cervical Nodes:  No significant cervical adenopathy. Psych:   Alert and cooperative. Normal mood and affect.  Intake/Output from previous day: 04/07 0701 - 04/08 0700 In: 1080 [P.O.:1080] Out: 851 [Urine:850; Stool:1] Intake/Output this shift: Total I/O In: 980 [P.O.:960; I.V.:20] Out: 300 [Urine:300]  Lab Results: Recent Labs    03/19/18 0500 03/20/18 0530  WBC 10.9* 10.6*  HGB 11.6* 11.8*  HCT 36.6* 37.8*  PLT 119* 127*   BMET Recent Labs    03/18/18 0230 03/19/18 0500 03/20/18 0530  NA 149* 147* 143  K 3.8 3.6 3.4*  CL 114* 112* 109  CO2 $Re'27 25 23  'GyT$ GLUCOSE 233* 227* 145*  BUN 37* 31* 30*  CREATININE 1.92* 1.87* 1.75*  CALCIUM 8.2* 8.0* 8.1*   LFT Recent Labs    03/17/18 2331  03/20/18 0530  PROT 4.6*   < > 5.6*  ALBUMIN 1.9*   < > 2.3*  AST 26   < > 25  ALT 16*   < > 18  ALKPHOS 91   < > 107  BILITOT 0.5   < > 0.4  BILIDIR 0.1  --   --   IBILI 0.4  --   --    < > = values in this interval not displayed.   PT/INR No results for input(s): LABPROT, INR in the last 72 hours.  Studies/Results: Dg Esophagus  Result Date:  03/20/2018 CLINICAL DATA:  Dysphagia.  Pain and food sticking in mid chest EXAM: ESOPHOGRAM/BARIUM SWALLOW TECHNIQUE: Single contrast examination was performed using thin barium. A 13 mm barium tablet was also given. FLUOROSCOPY TIME:  Fluoroscopy Time:  1 minutes 18 seconds Radiation Exposure Index (if provided by the fluoroscopic device): Number of Acquired Spot Images:  0 COMPARISON:  Chest CT 07/18/2016 FINDINGS: There is a small hiatal hernia. Disruption of all primary esophageal peristaltic waves with stasis. There is irregular luminal narrowing within the distal esophagus just above the hernia. The 13 mm barium tablet sticks just above this area of narrowing, thin also sticks in the distal esophagus at this area of irregular narrowing. This reproduces the patient's severe pain symptoms. IMPRESSION: Esophageal dysmotility. Irregular stricture in the distal esophagus just above a small hiatal hernia. The 13 mm barium tablet sticks several cm proximal to this area, then again at this area of distal esophageal narrowing. Cannot exclude esophageal neoplasm. Recommend further evaluation with endoscopy. Electronically Signed   By: Rolm Baptise M.D.   On: 03/20/2018 14:08    Impression: 1.  Long-standing dysphagia, intermittent, somewhat progressive in character 2.  Barium swallow showing distal irregular esophageal narrowing raising the question of possible malignancy 3.  Long-standing reflux symptoms, fairly well controlled by patient's history, pantoprazole 40 mg daily 4.  History of advanced adenoma removed from: 6 months ago 5.  Recent DKA with resolving metabolic disarray 6.  Multiple medical problems including coronary disease status post MI and stent placement with adequate ejection fraction, double agent antiplatelet therapy (aspirin and Brilinta), sleep apnea, diabetes, and chronic kidney disease  Plan: I feel the patient needs endoscopic evaluation, which has been arranged for tomorrow.  The  nature, purpose, and risks of the procedure were carefully discussed with patient and he is agreeable to proceed.  He is aware that there is a chance we will do endoscopic dilatation of the narrowed area of his esophagus, if it appears fairly "tight" and there is not a large amount of associated inflammation (the presence of which would increase the risk of perforation associated with dilatation).  Risks of perforation with possible need for surgery and ICU care, hemorrhage, and anesthesia problems were discussed.  Depending on the endoscopic findings, the patient may need an increase in his PPI dose (to twice daily dosing).  I have also advised the patient to make sure that his food is cut into very small pieces.  I have reviewed the case with the patient's attending hospitalist physician, who will probably be modifying his insulin since the egd is not until 1 pm tomorrow and the patient will need to be NPO except for sips with meds.   LOS: 5 days   Jayvon Mounger V  03/20/2018, 5:47 PM   Pager [phone] If no answer or after 5 PM call [phone]

## 2018-03-20 NOTE — Progress Notes (Addendum)
PROGRESS NOTE  Joshua [name] WUJ:[id number] DOB: 1956/04/16 DOA: 03/14/2018 PCP: Mack Hook, MD  HPI/Recap of past 24 hours: Joshua [name] is a 62 y.o. male with medical history significant of diabetes, chronic kidney disease, chronic congestive heart failure, gastroparesis is brought in by his wife because of over 2 days of worsening confusion and totally being out of it.  She reports that he has had a gastroparesis flare where he has had a lot of nausea and vomiting the last several days and she thought he was taking his medication but apparently he was not.  He has not taken his meds for several days including his insulin.  Today he was extremely confused and acting very abnormal so she brought him to the ED.  Patient is found to have a sugar of over 1200 with a temperature of 85 degrees rectally.  She does not know of any fevers that he has been having.  She does not know of any diarrhea.  Patient is referred for admission for profound DKA dehydration and possible sepsis.  She denies him having any rashes.  All history is obtained from wife as patient cannot provide any history due to altered mentation.  03/15/18: seen and examined at his bedside in the ED. His wife and son are in the room. Majority of the story obtained from his wife. Patient has been noncompliant with all his medications, including insulin, asa and brillinta. Wife reports patient is a heavy drinker and stopped alcohol use about 1 and half weeks ago.   Severe renal failure on presentation with urine retention and oliguria. Obtain renal ultrasound and consult nephrology.  03/16/18: Patient seen and examined this morning at his bedside. He is alert but minimally interactive. Persistent altered mental status. Worsening hypernatremia. NG tube ordered for free water flushes. On 1/2 NS switched IV fluid to D5 1/4NS to maintain hydration and improve hypernatremia. BMP q4H. Per RN, patient has clear speech. Because of that would  not pursue further brain imaging.  03/17/18: Seen and examined at his bedside.  Patient is alert and oriented x.3.  States he is hungry.  We will transition him to long-acting insulin and allow for oral nutrition.  03/18/18: Seen and examined at his bedside.  He reports diffused abdominal pain.  Poor historian and unable to elaborate further.  Reglan started for suspected gastroparesis. Contacted [facility] for further assessment.  03/19/18: seen and examined at his bedside. Complaints of food getting stuck in his throat. Reports this is chronic. GI will see in the am. NPO after midnight.  03/20/18: persistent GI complaints. Dysphagia with food stuck in his throat. GI consulted. Esophagram ordered.  Assessment/Plan: Principal Problem:   Sepsis ([place]) Active Problems:   CAD S/P multiple PCIs   Chronic combined systolic and diastolic congestive heart failure (HCC)   Acute renal failure superimposed on stage 3 chronic kidney disease (HCC)   Essential hypertension   DKA (diabetic ketoacidoses) (HCC)  Acute metabolic encephalopathy most likely multifactorial 2/2 to DKA vs dehydration vs AKI vs others, resolved Anion gap Remains closed. lantus increased from 20 U to 24 U BID Novolog increased from 6 U to 13 U TID Continue ISS moderate scale Encourage po fluid intake BMP am  Type 2 diabetes uncontrolled with hyperglycemia, improving  Last hemoglobin A1c greater than 15.5 on 03/15/2018 C/w lantus 24 U BID C/w Novolog 13U TID Continue moderate scale ISS Avoid hypoglycemia  Chronic dyspepsia/dysphagia [facility] consulted Last visit a year ago (colonoscopy)  Esophagram ordered GI, Dr. Carma Leaven, will see today. Highly appreciated Continue protonix po daily Continue GI cocktail prn  Hypovolemic hypernatremia likely secondary to severe dehydration, resolved Na+ 143 from 147 peaked at 155. Encourage po fluid intake  AKI on CKD 3, improving Baseline creatinine 1.7 Cr 1.75 from 1.87 Cr peaked at  5.25 Avoid nephrotoxic agents/hypotension/dehydration Continue to monitor urine output Repeat BMP in the morning  Severe hypophosphatemia, improving Phos 2.2 from 1.0 Repleted IV with kphos  Severe protein calorie malnutrition,  Improving Albumin 2.3 from 2.2 from 2.1 Oral supplementation  DKA, resolved Anion gap has closed  Hypokalemia K+3.4 Repleted with oral supplement Repeat BMP am  Severe dehydration, resolved Continue to encourage p.o. fluid intake  Coronary artery disease status post PCI Denies chest pain Continue aspirin and Brilinta Continue statin Patient stopped taking his Brilinta for 5 days prior to presentation  Urinary retention status post Foley placement Renal ultrasound did not reveal any acute findings.  No obstruction or hydronephrosis mentioned Continue to monitor urine output Remove foley cath 03/19/18  Leukocytosis, possibly reactive, resolved Wbc 10 from 13 Afebrile Urinalysis negative CT abdomen and pelvis unremarkable Chest x-ray unremarkable for any lobular infiltrates DC IV vancomycin and IV Zosyn and continue to closely monitor  Chronic combined systolic and diastolic heart failure Will need to continue IV hydration due to severe dehydration Monitor O2 saturation, vital signs Continue telemetry Last 2D echo 07/31/15 LVEF 55-60%  Morbid obesity BMI 41 Weight loss outpatient  Medical noncompliance Per his wife, patient had stopped taking all his medications 5 days prior to presentation  History of alcohol abuse Per his wife patient stopped alcohol use a week and half prior to his presentation Continue folate and thiamine Alcohol withdrawal protocol    Code Status: Full  Family Communication: None at bedside. Spoke with his wife in person on 03/15/18.   Disposition Plan: Home when clinically stable.   Consultants:  [facility] Dr Carma Leaven  Procedures:  none   Antimicrobials:  Dc IV vanc and IV zosyn   DVT  prophylaxis:  SCDs, sq heparin 5000 u tid   Objective: Vitals:   03/20/18 0525 03/20/18 0802 03/20/18 1157 03/20/18 1248  BP: (!) 136/93 114/77 (!) 148/106 116/80  Pulse: 84     Resp: (!) $RemoveB'24 18 18 17  'gJILmxGZ$ Temp: 98.7 F (37.1 C) 98.8 F (37.1 C) 98.8 F (37.1 C)   TempSrc: Oral Oral Oral   SpO2: 98% 98%    Weight: 111.9 kg (246 lb 11.1 oz)     Height:        Intake/Output Summary (Last 24 hours) at 03/20/2018 1438 Last data filed at 03/20/2018 1224 Gross per 24 hour  Intake 620 ml  Output 850 ml  Net -230 ml   Filed Weights   03/18/18 0255 03/19/18 0342 03/20/18 0525  Weight: 111.3 kg (245 lb 6 oz) 110.4 kg (243 lb 6.2 oz) 111.9 kg (246 lb 11.1 oz)     Exam: 03/20/18  General: 62 yo AAM WD wN NAD A&O x 3  Cardiovascular: RRR no rubs or gallops. No JVD or thyromegaly  Respiratory: CTA no wheezes or rales  Abdomen: soft non tender non distended with NBS x 4  Musculoskeletal: right forefoot amputation with a well healed scar.  Skin: As noted above  Psychiatry: Mood is appropriate for condition and setting.   Data Reviewed: CBC: Recent Labs  Lab 03/14/18 2005 03/14/18 2016 03/16/18 0748 03/17/18 1016 03/19/18 0500 03/20/18 0530  WBC 16.4*  --  15.6* 13.5* 10.9* 10.6*  NEUTROABS 12.6*  --   --   --   --   --   HGB 15.9 17.0 14.2 12.9* 11.6* 11.8*  HCT 52.0 50.0 40.3 39.4 36.6* 37.8*  MCV 96.3  --  83.1 87.0 87.4 88.1  PLT 191  --  140* 127* 119* 196*   Basic Metabolic Panel: Recent Labs  Lab 03/17/18 0537  03/17/18 1658 03/17/18 2331 03/18/18 0230 03/19/18 0500 03/20/18 0530  NA 150*   < > 149* 134* 149* 147* 143  K 3.8   < > 4.2 3.6 3.8 3.6 3.4*  CL 113*   < > 112* 103 114* 112* 109  CO2 26   < > $R'26 24 27 25 23  'lx$ GLUCOSE 136*   < > 359* 726* 233* 227* 145*  BUN 49*   < > 41* 38* 37* 31* 30*  CREATININE 1.73*   < > 1.84* 1.93* 1.92* 1.87* 1.75*  CALCIUM 8.1*   < > 8.0* 7.3* 8.2* 8.0* 8.1*  MG  --   --   --   --   --   --  2.3  PHOS 1.0*  --  1.3*   --   --   --  2.2*   < > = values in this interval not displayed.   GFR: Estimated Creatinine Clearance: 51.6 mL/min (A) (by C-G formula based on SCr of 1.75 mg/dL (H)). Liver Function Tests: Recent Labs  Lab 03/14/18 2005 03/17/18 0537 03/17/18 2331 03/19/18 0500 03/20/18 0530  AST 21  --  $R'26 22 25  'Lz$ ALT 17  --  16* 17 18  ALKPHOS 123  --  91 99 107  BILITOT 1.9*  --  0.5 0.5 0.4  PROT 5.9*  --  4.6* 5.5* 5.6*  ALBUMIN 2.8* 2.1* 1.9* 2.2* 2.3*   Recent Labs  Lab 03/14/18 2250 03/17/18 2331  LIPASE 176* 71*   Recent Labs  Lab 03/14/18 2250  AMMONIA 32   Coagulation Profile: No results for input(s): INR, PROTIME in the last 168 hours. Cardiac Enzymes: Recent Labs  Lab 03/14/18 2057 03/15/18 0045 03/15/18 0544 03/15/18 1236  CKTOTAL 620*  --   --   --   TROPONINI  --  0.03* 0.05* 0.09*   BNP (last 3 results) No results for input(s): PROBNP in the last 8760 hours. HbA1C: No results for input(s): HGBA1C in the last 72 hours. CBG: Recent Labs  Lab 03/19/18 1226 03/19/18 1642 03/19/18 2138 03/20/18 0805 03/20/18 1159  GLUCAP 250* 224* 481* 131* 207*   Lipid Profile: No results for input(s): CHOL, HDL, LDLCALC, TRIG, CHOLHDL, LDLDIRECT in the last 72 hours. Thyroid Function Tests: No results for input(s): TSH, T4TOTAL, FREET4, T3FREE, THYROIDAB in the last 72 hours. Anemia Panel: No results for input(s): VITAMINB12, FOLATE, FERRITIN, TIBC, IRON, RETICCTPCT in the last 72 hours. Urine analysis:    Component Value Date/Time   COLORURINE YELLOW 03/14/2018 2145   APPEARANCEUR HAZY (A) 03/14/2018 2145   LABSPEC 1.018 03/14/2018 2145   PHURINE 5.0 03/14/2018 2145   GLUCOSEU >=500 (A) 03/14/2018 2145   HGBUR SMALL (A) 03/14/2018 2145   BILIRUBINUR NEGATIVE 03/14/2018 2145   KETONESUR 5 (A) 03/14/2018 2145   PROTEINUR 100 (A) 03/14/2018 2145   UROBILINOGEN 0.2 11/01/2011 1410   NITRITE NEGATIVE 03/14/2018 2145   LEUKOCYTESUR NEGATIVE 03/14/2018 2145    Sepsis Labs: $RemoveBefo'[email or web address]$ (procalcitonin:4,lacticidven:4)  ) Recent Results (from the past 240 hour(s))  Blood Culture (routine x 2)  Status: None   Collection Time: 03/14/18  9:17 PM  Result Value Ref Range Status   Specimen Description BLOOD RIGHT HAND  Final   Special Requests IN PEDIATRIC BOTTLE Blood Culture adequate volume  Final   Culture   Final    NO GROWTH 5 DAYS Performed at [facility], [place] [address], [place], [place] [address]    Report Status 03/19/2018 FINAL  Final  Urine culture     Status: None   Collection Time: 03/14/18  9:39 PM  Result Value Ref Range Status   Specimen Description URINE, CLEAN CATCH  Final   Special Requests NONE  Final   Culture   Final    NO GROWTH Performed at [facility], [place] [address] [place], [place] [address]    Report Status 03/16/2018 FINAL  Final  Blood Culture (routine x 2)     Status: None (Preliminary result)   Collection Time: 03/14/18 10:50 PM  Result Value Ref Range Status   Specimen Description BLOOD LEFT ANTECUBITAL  Final   Special Requests   Final    BOTTLES DRAWN AEROBIC AND ANAEROBIC Blood Culture adequate volume   Culture   Final    NO GROWTH 4 DAYS Performed at [facility], [place] [address], [place], [place] [address]    Report Status PENDING  Incomplete  MRSA PCR Screening     Status: None   Collection Time: 03/15/18  7:25 AM  Result Value Ref Range Status   MRSA by PCR NEGATIVE NEGATIVE Final    Comment:        The GeneXpert MRSA Assay (FDA approved for NASAL specimens only), is one component of a comprehensive MRSA colonization surveillance program. It is not intended to diagnose MRSA infection nor to guide or monitor treatment for MRSA infections. Performed at [facility], [place] [address], [place], [place] [address]       Studies: Dg Esophagus  Result Date: 03/20/2018 CLINICAL DATA:  Dysphagia.  Pain and food sticking in mid chest EXAM: ESOPHOGRAM/BARIUM SWALLOW  TECHNIQUE: Single contrast examination was performed using thin barium. A 13 mm barium tablet was also given. FLUOROSCOPY TIME:  Fluoroscopy Time:  1 minutes 18 seconds Radiation Exposure Index (if provided by the fluoroscopic device): Number of Acquired Spot Images: 0 COMPARISON:  Chest CT 07/18/2016 FINDINGS: There is a small hiatal hernia. Disruption of all primary esophageal peristaltic waves with stasis. There is irregular luminal narrowing within the distal esophagus just above the hernia. The 13 mm barium tablet sticks just above this area of narrowing, thin also sticks in the distal esophagus at this area of irregular narrowing. This reproduces the patient's severe pain symptoms. IMPRESSION: Esophageal dysmotility. Irregular stricture in the distal esophagus just above a small hiatal hernia. The 13 mm barium tablet sticks several cm proximal to this area, then again at this area of distal esophageal narrowing. Cannot exclude esophageal neoplasm. Recommend further evaluation with endoscopy. Electronically Signed   By: Rolm Baptise M.D.   On: 03/20/2018 14:08    Scheduled Meds: . amLODipine  10 mg Oral Daily  . aspirin EC  81 mg Oral QHS  . folic acid  1 mg Oral Daily  . heparin  5,000 Units Subcutaneous Q8H  . insulin aspart  0-15 Units Subcutaneous TID WC  . insulin aspart  0-5 Units Subcutaneous QHS  . insulin aspart  13 Units Subcutaneous TID AC  . insulin glargine  24 Units Subcutaneous BID  .  isosorbide mononitrate  120 mg Oral Daily  . metoCLOPramide  10 mg Oral TID AC  . metoprolol succinate  150 mg Oral QPM  . multivitamin with minerals  1 tablet Oral Daily  . pantoprazole  40 mg Oral Daily  . polyethylene glycol  17 g Oral Daily  . potassium chloride  40 mEq Oral BID  . sennosides  5 mL Oral BID  . sodium chloride flush  10-40 mL Intracatheter Q12H  . sucralfate  1 g Oral TID WC & HS  . thiamine  100 mg Oral Daily  . ticagrelor  90 mg Oral BID    Continuous Infusions:     LOS: 5 days     Kayleen Memos, MD [facility] Pager [phone]  If 7PM-7AM, please contact night-coverage [email or web address] Password [facility] 03/20/2018, 2:38 PM

## 2018-03-20 NOTE — Consult Note (Addendum)
Referring Provider:  Dr. Billey Gosling. [name] ([facility]) Primary Care Physician:  Julieanne Manson, MD Primary Gastroenterologist:  Dr. Doy Mince  Reason for Consultation: Dysphagia  HPI: Joshua [name] is a 62 y.o. male with a history of coronary disease status post MI and stent placement, diabetes, obesity, obstructive sleep apnea and chronic kidney disease admitted 6 days ago with DKA and severe metabolic disarray, from which he is making a nice recovery.  In fact, he was medically otherwise ready for discharge, but the patient was complaining of significant intermittent dysphagia symptoms which he thought might actually be interfering with his ability to take medications, so we were asked to see the patient.  The patient is known to my partner, Dr. Doy Mince, who removed a 2 cm adenomatous polyp from him 6 months ago, but otherwise has not followed him on a regular basis.  With that background, the patient gives a long-standing (perhaps 20-year) history of intermittent dysphagia  to solid foods, with food causing painful obstruction in the distal esophagus, lasting minutes or sometimes quite a bit longer.  More severe episodes of this might only occur several times a year, but at other times, the patient has more mild, transient "pausing" of food.  There is a past history of acid reflux symptoms which have been quite well controlled by the use of pantoprazole 40 mg daily.  A barium swallow today showed irregular narrowing of the distal esophagus with hang up of the barium tablet at that location.  I have reviewed the films; there does not appear to be a high-grade stricture but there is some mucosal irregularity.  The patient has had recent significant weight loss but he attributes this to situational circumstances such as the loss of a job and stress associated with financial concerns.  From the lower tract perspective, he had colonoscopy as noted above, about 6 months ago; he does  tend toward constipation, typically just having 1 or 2 bowel movements per week, but not typically requiring use of a laxative.  He is not maintained on anything for laxation.     Past Medical History:  Diagnosis Date  . Anemia   . Chronic combined systolic and diastolic CHF (congestive heart failure) (HCC)    a. 07/2015 Echo: EF 45-50%, mod LVH, mid apical/antsept AK, Gr 1 DD.  [name] CKD (chronic kidney disease), stage III (HCC)   . Coronary artery disease    a. 2012 s/p MI/PCI: s/p DES to mid/dist RCA and overlapping DES to LAD;  b. 07/2015 Cath: LAD 10% ISR, D1 40(jailed), LCX 34m, OM2 30, OM3 30, RCA 91m ISR, 10d ISR; c. 07/2016 NSTEMI/PCI: LAD 85ost/p/m (3.5x20 Synergy DES overlapping prior stent), D1 40, LCX 9m, OM2 95 (2.75x16 Synergy DES), OM3 30, RCA 66m, 10d.  . Depression   . Diabetic gastroparesis (HCC)   . DKA (diabetic ketoacidoses) (HCC) 03/14/2018  . GERD (gastroesophageal reflux disease)   . Gout   . Gout   . Heart murmur   . Hyperlipemia   . Hypertensive heart disease   . Ischemic cardiomyopathy    a. 07/2015 Echo: EF 45-50%, mod LVH, mid-apicalanteroseptal AK, Gr1 DD.  . Morbid obesity (HCC)   . Myocardial infarction (HCC) 2012  . OSA on CPAP   . Osteomyelitis (HCC)    a. 07/2016 MTP joint of R great toe s/p transmetatarsal amputation.  . Polyneuropathy in diabetes(357.2)   . Pulmonary sarcoidosis (HCC)    "problems w/it years ago; no problems anymore" (07/03/2014)  .  Rectal bleeding   . Type II diabetes mellitus ([place])     Past Surgical History:  Procedure Laterality Date  . AMPUTATION Right 07/24/2016   Procedure: TRANSMETATARSAL FOOT AMPUTATION;  Surgeon: [name], MD;  Location: [place];  Service: Orthopedics;  Laterality: Right;  . BREAST LUMPECTOMY Right ~ 2000   "benign"  . CARDIAC CATHETERIZATION N/A 07/30/2015   Procedure: Left Heart Cath and Coronary Angiography;  Surgeon: Burnell Blanks, MD;  Location: [place] [facility];  Service:  Cardiovascular;  Laterality: N/A;  . CARDIAC CATHETERIZATION N/A 07/19/2016   Procedure: Left Heart Cath and Coronary Angiography;  Surgeon: Belva Crome, MD;  Location: [place] [facility];  Service: Cardiovascular;  Laterality: N/A;  . CARDIAC CATHETERIZATION N/A 07/29/2016   Procedure: Coronary Stent Intervention;  Surgeon: Belva Crome, MD;  Location: [place] [facility];  Service: Cardiovascular;  Laterality: N/A;  . CARDIAC CATHETERIZATION N/A 07/29/2016   Procedure: Intravascular Ultrasound/IVUS;  Surgeon: Belva Crome, MD;  Location: [place] [facility];  Service: Cardiovascular;  Laterality: N/A;  . CATARACT EXTRACTION W/ INTRAOCULAR LENS  IMPLANT, BILATERAL Bilateral 2014-2015  . COLONOSCOPY WITH PROPOFOL N/A 08/22/2017   Procedure: COLONOSCOPY WITH PROPOFOL;  Surgeon: [name], MD;  Location: [place];  Service: Endoscopy;  Laterality: N/A;  . CORONARY ANGIOPLASTY WITH STENT PLACEMENT  10/31/2011   30 % MID ATRIOVENTRICULAR GROOVE CX STENOSIS. 90 % MID RCA.PTA TO LAD/STENTING OF TANDEM 60-70%. LAD STENOSIS 99% REDUCED TO 0%.3.0 X 32MM PROMUS DES POSTDILATED TO 3.25MM.  [name] CORONARY ANGIOPLASTY WITH STENT PLACEMENT  07/03/2014   "2"  . I&D EXTREMITY Right 10/30/2013   Procedure: IRRIGATION AND DEBRIDEMENT RIGHT SMALL FINGER POSSIBLE SECONDARY WOUND CLOSURE;  Surgeon: [name], MD;  Location: [facility];  Service: Orthopedics;  Laterality: Right;  [name] is available after 4pm  . I&D EXTREMITY Right 11/01/2013   Procedure:  IRRIGATION AND DEBRIDEMENT RIGHT  PROXIMAL PHALANGEAL LEVEL AMPUTATION;  Surgeon: [name], MD;  Location: [facility];  Service: Orthopedics;  Laterality: Right;  . INCISION AND DRAINAGE Right 10/28/2013   Procedure: INCISION AND DRAINAGE Right small finger;  Surgeon: [name], MD;  Location: [facility];  Service: Orthopedics;  Laterality: Right;  . LEFT HEART CATH Left 11/03/2011   Procedure: LEFT HEART CATH;  Surgeon: [name], MD;   Location: [facility];  Service: Cardiovascular;  Laterality: Left;  . LEFT HEART CATHETERIZATION WITH CORONARY ANGIOGRAM N/A 07/03/2014   Procedure: LEFT HEART CATHETERIZATION WITH CORONARY ANGIOGRAM;  Surgeon: [name], MD;  Location: [facility] C[facility] LAB;  Service: Cardiovascular;  Laterality: N/A;  . PERCUTANEOUS CORONARY STENT INTERVENTION (PCI-S) Left 11/03/2011   Procedure: PERCUTANEOUS CORONARY STENT INTERVENTION (PCI-S);  Surgeon: [name], MD;  Location: [facility] C[facility] LAB;  Service: Cardiovascular;  Laterality: Left;  . TOE AMPUTATION Right ~ 2010   "2nd toe"  . TOE AMPUTATION Right 2012   "3rd toe"    Prior to Admission medications   Medication Sig Start Date End Date Taking? Authorizing Provider  acetaminophen (TYLENOL) 325 MG tablet Take 2 tablets (650 mg total) by mouth every 4 (four) hours as needed for headache or mild pain. 08/03/16  Yes Isaiah Serge, NP  acetaminophen (TYLENOL) 650 MG CR tablet Take 1,300 mg by mouth daily as needed for pain.   Yes [provider]  allopurinol (ZYLOPRIM) 300 MG tablet Take 300 mg by mouth daily.    Yes [provider]  amLODipine (NORVASC) 10 MG tablet Take 1 tablet (10 mg total) by mouth daily. 10/07/17  Yes [name], MD  aspirin EC 81 MG tablet Take 81 mg by mouth at bedtime.    Yes [provider]  BRILINTA 90 MG TABS tablet TAKE 1 TABLET BY MOUTH TWICE A DAY Patient taking differently: TAKE 1 TABLET (90 MG) BY MOUTH TWICE A DAY 03/07/17  Yes [name], MD  hydrALAZINE (APRESOLINE) 50 MG tablet Take 50 mg by mouth every 8 (eight) hours.  09/05/17  Yes [provider]  hydrochlorothiazide (MICROZIDE) 12.5 MG capsule Take 1 capsule (12.5 mg total) by mouth daily. 11/29/17 03/16/18 Yes [name], MD  insulin NPH Human (HUMULIN N,NOVOLIN N) 100 UNIT/ML injection Inject 20 Units into the skin 2 (two) times daily.    Yes [provider]  insulin regular (NOVOLIN R,HUMULIN R) 100  units/mL injection Inject 10-20 Units into the skin daily with supper.    Yes [provider]  isosorbide mononitrate (IMDUR) 120 MG 24 hr tablet Take 1 tablet (120 mg total) by mouth daily. Patient taking differently: Take 120 mg by mouth every evening.  07/29/17  Yes [name], MD  metoprolol succinate (TOPROL XL) 100 MG 24 hr tablet Take 1 & 1/2  Tablets daily Patient taking differently: Take 150 mg by mouth every evening.  07/29/17  Yes [name], MD  nitroGLYCERIN (NITROSTAT) 0.4 MG SL tablet Place 1 tablet (0.4 mg total) under the tongue every 5 (five) minutes as needed for chest pain. 11/29/17  Yes [name], MD  pantoprazole (PROTONIX) 40 MG tablet Take 1 tablet (40 mg total) by mouth daily. Patient taking differently: Take 40 mg by mouth every evening.  08/13/16  Yes Love, Ivan Anchors, PA-C  rosuvastatin (CRESTOR) 40 MG tablet Take 1 tablet (40 mg total) by mouth daily. 03/23/17  Yes [name], MD    Current Facility-Administered Medications  Medication Dose Route Frequency Provider Last Rate Last Dose  . amLODipine (NORVASC) tablet 10 mg  10 mg Oral Daily Irene Pap N, DO   10 mg at 03/20/18 0959  . aspirin EC tablet 81 mg  81 mg Oral QHS Phillips Grout, MD   81 mg at 03/19/18 2140  . bisacodyl (DULCOLAX) EC tablet 5 mg  5 mg Oral Daily PRN Irene Pap N, DO   5 mg at 03/18/18 1252  . dextrose 50 % solution 25 mL  25 mL Intravenous PRN Kirby-Graham, Karsten Fells, NP      . folic acid (FOLVITE) tablet 1 mg  1 mg Oral Daily [place], Carole N, DO   1 mg at 03/20/18 0959  . gi cocktail (Maalox,Lidocaine,Donnatal)  30 mL Oral BID PRN Irene Pap N, DO   30 mL at 03/19/18 1718  . heparin injection 5,000 Units  5,000 Units Subcutaneous Q8H Phillips Grout, MD   5,000 Units at 03/20/18 1426  . insulin aspart (novoLOG) injection 0-15 Units  0-15 Units Subcutaneous TID WC Irene Pap N, DO   5 Units at 03/20/18 1223  . insulin aspart (novoLOG) injection 0-5 Units  0-5  Units Subcutaneous QHS Kayleen Memos, DO   5 Units at 03/18/18 2145  . insulin aspart (novoLOG) injection 13 Units  13 Units Subcutaneous TID AC Kayleen Memos, DO   13 Units at 03/20/18 [phone]  . insulin glargine (LANTUS) injection 24 Units  24 Units Subcutaneous BID Kayleen Memos, DO  24 Units at 03/20/18 1000  . isosorbide mononitrate (IMDUR) 24 hr tablet 120 mg  120 mg Oral Daily Irene Pap N, DO   120 mg at 03/20/18 0959  . metoCLOPramide (REGLAN) tablet 10 mg  10 mg Oral TID AC Hall, Carole N, DO   10 mg at 03/20/18 1722  . metoprolol succinate (TOPROL-XL) 24 hr tablet 150 mg  150 mg Oral QPM Hall, Carole N, DO   150 mg at 03/20/18 1722  . multivitamin with minerals tablet 1 tablet  1 tablet Oral Daily Irene Pap N, DO   1 tablet at 03/20/18 0959  . pantoprazole (PROTONIX) EC tablet 40 mg  40 mg Oral Daily Derrill Kay A, MD   40 mg at 03/20/18 0957  . polyethylene glycol (MIRALAX / GLYCOLAX) packet 17 g  17 g Oral Daily Irene Pap N, DO   17 g at 03/20/18 0957  . potassium chloride SA (K-DUR,KLOR-CON) CR tablet 40 mEq  40 mEq Oral BID Irene Pap N, DO   40 mEq at 03/20/18 0959  . sennosides (SENOKOT) 8.8 MG/5ML syrup 5 mL  5 mL Oral BID Irene Pap N, DO   5 mL at 03/20/18 0959  . sodium chloride flush (NS) 0.9 % injection 10-40 mL  10-40 mL Intracatheter Q12H Hall, Carole N, DO   20 mL at 03/20/18 1224  . sodium chloride flush (NS) 0.9 % injection 10-40 mL  10-40 mL Intracatheter PRN Irene Pap N, DO      . sucralfate (CARAFATE) 1 GM/10ML suspension 1 g  1 g Oral TID WC & HS Hall, Carole N, DO   1 g at 03/20/18 1722  . thiamine (VITAMIN B-1) tablet 100 mg  100 mg Oral Daily Irene Pap N, DO   100 mg at 03/20/18 0959  . ticagrelor (BRILINTA) tablet 90 mg  90 mg Oral BID Phillips Grout, MD   90 mg at 03/20/18 2694    Allergies as of 03/14/2018 - Review Complete 03/14/2018  Allergen Reaction Noted  . Chlorthalidone Other (See Comments) 05/22/2017    Family History   Problem Relation Age of Onset  . Heart attack Maternal Aunt   . Diabetes Maternal Grandmother   . Hypertension Maternal Grandmother     Social History   Socioeconomic History  . Marital status: Married    Spouse name: Not on file  . Number of children: Not on file  . Years of education: Not on file  . Highest education level: Not on file  Occupational History  . Not on file  Social Needs  . Financial resource strain: Not on file  . Food insecurity:    Worry: Not on file    Inability: Not on file  . Transportation needs:    Medical: Not on file    Non-medical: Not on file  Tobacco Use  . Smoking status: Never Smoker  . Smokeless tobacco: Never Used  Substance and Sexual Activity  . Alcohol use: Yes    Comment: social   . Drug use: No  . Sexual activity: Yes  Lifestyle  . Physical activity:    Days per week: Not on file    Minutes per session: Not on file  . Stress: Not on file  Relationships  . Social connections:    Talks on phone: Not on file    Gets together: Not on file    Attends religious service: Not on file    Active member of club or organization: Not on  file    Attends meetings of clubs or organizations: Not on file    Relationship status: Not on file  . Intimate partner violence:    Fear of current or ex partner: Not on file    Emotionally abused: Not on file    Physically abused: Not on file    Forced sexual activity: Not on file  Other Topics Concern  . Not on file  Social History Narrative  . Not on file    Review of Systems: See history of present illness  Physical Exam: Vital signs in last 24 hours: Temp:  [98.7 F (37.1 C)-99.5 F (37.5 C)] 99.5 F (37.5 C) (04/08 1638) Pulse Rate:  [82-96] 96 (04/08 1722) Resp:  [15-24] 16 (04/08 1638) BP: (105-148)/(67-106) 112/67 (04/08 1638) SpO2:  [98 %-100 %] 98 % (04/08 0802) Weight:  [111.9 kg (246 lb 11.1 oz)] 111.9 kg (246 lb 11.1 oz) (04/08 0525) Last BM Date: 03/18/18 General:    Alert,  Well-developed, well-nourished, somewhat overweight, pleasant and cooperative in NAD Head:  Normocephalic and atraumatic. Eyes:  Sclera clear, no icterus.   Conjunctiva pink. Mouth:   No ulcerations or lesions.  Oropharynx pink & moist. Neck:   No masses or thyromegaly. Lungs:  Clear throughout to auscultation.   No wheezes, crackles, or rhonchi. No evident respiratory distress. Heart:   Regular rate and rhythm; no murmurs, clicks, rubs,  or gallops. Abdomen:    Moderately adipose, no tympany, no overt tenderness or guarding  Msk:   Symmetrical without gross deformities but s/p amputation of part of his right little finger. Extremities:   Without clubbing or cyanosis, but with 2+ tibial edema. Neurologic:  Alert and coherent;  grossly normal neurologically. Skin:  Intact without significant lesions or rashes. Cervical Nodes:  No significant cervical adenopathy. Psych:   Alert and cooperative. Normal mood and affect.  Intake/Output from previous day: 04/07 0701 - 04/08 0700 In: 1080 [P.O.:1080] Out: 851 [Urine:850; Stool:1] Intake/Output this shift: Total I/O In: 980 [P.O.:960; I.V.:20] Out: 300 [Urine:300]  Lab Results: Recent Labs    03/19/18 0500 03/20/18 0530  WBC 10.9* 10.6*  HGB 11.6* 11.8*  HCT 36.6* 37.8*  PLT 119* 127*   BMET Recent Labs    03/18/18 0230 03/19/18 0500 03/20/18 0530  NA 149* 147* 143  K 3.8 3.6 3.4*  CL 114* 112* 109  CO2 $Re'27 25 23  'TPt$ GLUCOSE 233* 227* 145*  BUN 37* 31* 30*  CREATININE 1.92* 1.87* 1.75*  CALCIUM 8.2* 8.0* 8.1*   LFT Recent Labs    03/17/18 2331  03/20/18 0530  PROT 4.6*   < > 5.6*  ALBUMIN 1.9*   < > 2.3*  AST 26   < > 25  ALT 16*   < > 18  ALKPHOS 91   < > 107  BILITOT 0.5   < > 0.4  BILIDIR 0.1  --   --   IBILI 0.4  --   --    < > = values in this interval not displayed.   PT/INR No results for input(s): LABPROT, INR in the last 72 hours.  Studies/Results: Dg Esophagus  Result Date:  03/20/2018 CLINICAL DATA:  Dysphagia.  Pain and food sticking in mid chest EXAM: ESOPHOGRAM/BARIUM SWALLOW TECHNIQUE: Single contrast examination was performed using thin barium. A 13 mm barium tablet was also given. FLUOROSCOPY TIME:  Fluoroscopy Time:  1 minutes 18 seconds Radiation Exposure Index (if provided by the fluoroscopic device): Number of Acquired Spot Images:  0 COMPARISON:  Chest CT 07/18/2016 FINDINGS: There is a small hiatal hernia. Disruption of all primary esophageal peristaltic waves with stasis. There is irregular luminal narrowing within the distal esophagus just above the hernia. The 13 mm barium tablet sticks just above this area of narrowing, thin also sticks in the distal esophagus at this area of irregular narrowing. This reproduces the patient's severe pain symptoms. IMPRESSION: Esophageal dysmotility. Irregular stricture in the distal esophagus just above a small hiatal hernia. The 13 mm barium tablet sticks several cm proximal to this area, then again at this area of distal esophageal narrowing. Cannot exclude esophageal neoplasm. Recommend further evaluation with endoscopy. Electronically Signed   By: Rolm Baptise M.D.   On: 03/20/2018 14:08    Impression: 1.  Long-standing dysphagia, intermittent, somewhat progressive in character 2.  Barium swallow showing distal irregular esophageal narrowing raising the question of possible malignancy 3.  Long-standing reflux symptoms, fairly well controlled by patient's history, pantoprazole 40 mg daily 4.  History of advanced adenoma removed from: 6 months ago 5.  Recent DKA with resolving metabolic disarray 6.  Multiple medical problems including coronary disease status post MI and stent placement with adequate ejection fraction, double agent antiplatelet therapy (aspirin and Brilinta), sleep apnea, diabetes, and chronic kidney disease  Plan: I feel the patient needs endoscopic evaluation, which has been arranged for tomorrow.  The  nature, purpose, and risks of the procedure were carefully discussed with patient and he is agreeable to proceed.  He is aware that there is a chance we will do endoscopic dilatation of the narrowed area of his esophagus, if it appears fairly "tight" and there is not a large amount of associated inflammation (the presence of which would increase the risk of perforation associated with dilatation).  Risks of perforation with possible need for surgery and ICU care, hemorrhage, and anesthesia problems were discussed.  Depending on the endoscopic findings, the patient may need an increase in his PPI dose (to twice daily dosing).  I have also advised the patient to make sure that his food is cut into very small pieces.  I have reviewed the case with the patient's attending hospitalist physician, who will probably be modifying his insulin since the egd is not until 1 pm tomorrow and the patient will need to be NPO except for sips with meds.   LOS: 5 days   Jiovanna Frei V  03/20/2018, 5:47 PM   Pager [phone] If no answer or after 5 PM call [phone]

## 2018-03-20 NOTE — Care Management (Addendum)
CM met with pt on 4/5 - Please see assessment note dated on 4/5.    CM again attempted to make appt with [facility] and [place] on 4/8 however was informed appts are not available at this time.  CM was also unable to secure appt at [facility] due to pts zip code.  CM was able to get appt at [facility] 5/8 at [place] will need to go to clinic on 4/18 at 8:30 to apply for orange card.  Pts first appt will require copay of $20, if pt doesn't secure orange card future visits will cost him $60.  All information provided to pt, CM informed attending

## 2018-03-20 NOTE — Progress Notes (Signed)
  Speech Language Pathology Treatment: Dysphagia  Patient Details Name: Joshua [name] MRN: [id number] DOB: 1956-07-02 Today's Date: 03/20/2018 Time: 1062-6948 SLP Time Calculation (min) (ACUTE ONLY): 24 min  Assessment / Plan / Recommendation Clinical Impression  Barium esophagram results noted. Significant grimace and reports of pain during consumption of ice water alleviated by hot/warm tea consistently throughout session. Educated pt re: strategies to mitigate symptoms. Discussed various textures of po with pt and agreed upon full liquids to receive warmer foods (soups etc) that may be easier transited through esophagus. Educated pt on esophageal strategies. Wife was under impression EGD would have possibly been done over this past weekend. SLP will continue intervention.    HPI HPI: Joshua [name] a 62 y.o.malewith medical history significant ofdiabetes, chronic kidney disease, chronic congestive heart failure, gastroparesis is brought in by his wife because of over 2 days of worsening confusion and totally being out of it. Pt presented to ED with a sugar of over 1200 with a temperature of 85 degrees rectally. Pt admitted for DKA, sepsis, and severe renal failure with urine retention and oliguria. Normal head CT. CXR revealed no active disease.      SLP Plan  Continue with current plan of care       Recommendations  Diet recommendations: Thin liquid(full liquids) Liquids provided via: Cup;Straw Medication Administration: Crushed with puree Supervision: Patient able to self feed Compensations: Slow rate;Small sips/bites Postural Changes and/or Swallow Maneuvers: Seated upright 90 degrees;Upright 30-60 min after meal                Oral Care Recommendations: Oral care BID Follow up Recommendations: None SLP Visit Diagnosis: Other (comment)(esophageal dysphagia following barium esophagram) Plan: Continue with current plan of care                      Houston Siren 03/20/2018, 3:06 PM  Orbie Pyo Colvin Caroli.Ed [employer] [phone]

## 2018-03-21 ENCOUNTER — Inpatient Hospital Stay (HOSPITAL_COMMUNITY): Payer: Medicaid Other | Admitting: Certified Registered"

## 2018-03-21 ENCOUNTER — Encounter (HOSPITAL_COMMUNITY)
Admission: EM | Disposition: A | Discharge status: Home / Self Care | Payer: Self-pay | Source: Home / Self Care | Attending: Internal Medicine

## 2018-03-21 ENCOUNTER — Encounter (HOSPITAL_COMMUNITY): Payer: Self-pay | Admitting: Gastroenterology

## 2018-03-21 HISTORY — PX: BALLOON DILATION: SHX5330

## 2018-03-21 HISTORY — PX: ESOPHAGOGASTRODUODENOSCOPY (EGD) WITH PROPOFOL: SHX5813

## 2018-03-21 LAB — COMPREHENSIVE METABOLIC PANEL
ALT: 25 U/L (ref 17–63)
AST: 33 U/L (ref 15–41)
Albumin: 2.2 g/dL — ABNORMAL LOW (ref 3.5–5.0)
Alkaline Phosphatase: 108 U/L (ref 38–126)
Anion gap: 7 (ref 5–15)
BUN: 22 mg/dL — ABNORMAL HIGH (ref 6–20)
CO2: 24 mmol/L (ref 22–32)
Calcium: 8.5 mg/dL — ABNORMAL LOW (ref 8.9–10.3)
Chloride: 115 mmol/L — ABNORMAL HIGH (ref 101–111)
Creatinine, Ser: 1.61 mg/dL — ABNORMAL HIGH (ref 0.61–1.24)
GFR calc Af Amer: 52 mL/min — ABNORMAL LOW (ref >60)
GFR calc non Af Amer: 45 mL/min — ABNORMAL LOW (ref >60)
Glucose, Bld: 127 mg/dL — ABNORMAL HIGH (ref 65–99)
Potassium: 4 mmol/L (ref 3.5–5.1)
Sodium: 146 mmol/L — ABNORMAL HIGH (ref 135–145)
Total Bilirubin: 0.2 mg/dL — ABNORMAL LOW (ref 0.3–1.2)
Total Protein: 5.6 g/dL — ABNORMAL LOW (ref 6.5–8.1)

## 2018-03-21 LAB — CBC
HCT: 37.1 % — ABNORMAL LOW (ref 39.0–52.0)
Hemoglobin: 11.6 g/dL — ABNORMAL LOW (ref 13.0–17.0)
MCH: 27.7 pg (ref 26.0–34.0)
MCHC: 31.3 g/dL (ref 30.0–36.0)
MCV: 88.5 fL (ref 78.0–100.0)
Platelets: 126 10*3/uL — ABNORMAL LOW (ref 150–400)
RBC: 4.19 MIL/uL — ABNORMAL LOW (ref 4.22–5.81)
RDW: 15 % (ref 11.5–15.5)
WBC: 9.9 10*3/uL (ref 4.0–10.5)

## 2018-03-21 LAB — GLUCOSE, CAPILLARY
Glucose-Capillary: 113 mg/dL — ABNORMAL HIGH (ref 65–99)
Glucose-Capillary: 137 mg/dL — ABNORMAL HIGH (ref 65–99)
Glucose-Capillary: 152 mg/dL — ABNORMAL HIGH (ref 65–99)
Glucose-Capillary: 324 mg/dL — ABNORMAL HIGH (ref 65–99)

## 2018-03-21 SURGERY — ESOPHAGOGASTRODUODENOSCOPY (EGD) WITH PROPOFOL
Anesthesia: Monitor Anesthesia Care

## 2018-03-21 MED ORDER — BUTAMBEN-TETRACAINE-BENZOCAINE 2-2-14 % EX AERO
INHALATION_SPRAY | CUTANEOUS | Status: DC | PRN
  Administered 2018-03-21: 2 via TOPICAL

## 2018-03-21 MED ORDER — PROPOFOL 10 MG/ML IV BOLUS
INTRAVENOUS | Status: DC | PRN
  Administered 2018-03-21: 50 mg via INTRAVENOUS
  Administered 2018-03-21 (×2): 20 mg via INTRAVENOUS
  Administered 2018-03-21: 80 mg via INTRAVENOUS

## 2018-03-21 MED ORDER — PROPOFOL 500 MG/50ML IV EMUL
INTRAVENOUS | Status: DC | PRN
  Administered 2018-03-21: 125 ug/kg/min via INTRAVENOUS

## 2018-03-21 MED ORDER — PANTOPRAZOLE SODIUM 40 MG PO TBEC
40.0000 mg | DELAYED_RELEASE_TABLET | Freq: Two times a day (BID) | ORAL | Status: DC
  Administered 2018-03-21 – 2018-03-22 (×2): 40 mg via ORAL
  Filled 2018-03-21 (×2): qty 1

## 2018-03-21 MED ORDER — PHENYLEPHRINE HCL 10 MG/ML IJ SOLN
INTRAMUSCULAR | Status: DC | PRN
  Administered 2018-03-21: 240 ug via INTRAVENOUS
  Administered 2018-03-21: 160 ug via INTRAVENOUS

## 2018-03-21 MED ORDER — EPHEDRINE SULFATE 50 MG/ML IJ SOLN
INTRAMUSCULAR | Status: DC | PRN
  Administered 2018-03-21: 20 mg via INTRAVENOUS
  Administered 2018-03-21: 10 mg via INTRAVENOUS

## 2018-03-21 SURGICAL SUPPLY — 15 items

## 2018-03-21 NOTE — Interval H&P Note (Signed)
History and Physical Interval Note:  03/21/2018 1:12 PM  Joshua [name]  has presented today for surgery, with the diagnosis of dysphagia  The various methods of treatment have been discussed with the patient and family. After consideration of risks, benefits and other options for treatment, the patient has consented to  Procedure(s): ESOPHAGOGASTRODUODENOSCOPY (EGD) WITH PROPOFOL (N/A) BALLOON DILATION (N/A) as a surgical intervention .  The patient's history has been reviewed, patient examined, no change in status, stable for surgery.  I have reviewed the patient's chart and labs.  Questions were answered to the patient's satisfaction.     [name]

## 2018-03-21 NOTE — Op Note (Signed)
[facility] Patient Name: Joshua [name] Procedure Date : 03/21/2018 MRN: [id number] Attending MD: [name] , MD Date of Birth: [birth year] CSN: [id number] Age: 62 Admit Type: Inpatient Procedure:                Upper GI endoscopy Indications:              Dysphagia, Abnormal cine-esophagram showing distal                            stricture w/ irregular mucosa Providers:                [name], MD, Presley Raddle, RN, William Dalton, Technician Referring MD:              Medicines:                Monitored Anesthesia Care Complications:            No immediate complications. Estimated Blood Loss:     Estimated blood loss was minimal. Procedure:                Pre-Anesthesia Assessment:                           - Prior to the procedure, a History and Physical                            was performed, and patient medications and                            allergies were reviewed. The patient's tolerance of                            previous anesthesia was also reviewed. The risks                            and benefits of the procedure and the sedation                            options and risks were discussed with the patient.                            All questions were answered, and informed consent                            was obtained. Prior Anticoagulants: The patient has                            taken aspirin, last dose was day of procedure. ASA                            Grade Assessment: III - A patient with severe  systemic disease. After reviewing the risks and                            benefits, the patient was deemed in satisfactory                            condition to undergo the procedure.                           After obtaining informed consent, the endoscope was                            passed under direct vision. Throughout the                            procedure, the  patient's blood pressure, pulse, and                            oxygen saturations were monitored continuously. The                            [id number] ([id number]) scope was introduced through the                            mouth, and advanced to the second part of duodenum.                            The upper GI endoscopy was accomplished without                            difficulty. The patient tolerated the procedure                            well. Scope In: Scope Out: Findings:      The larynx was normal.      Diffuse severe mucosal changes characterized by erosion, inflammation       and ulceration were found in the middle third of the esophagus and in       the lower third of the esophagus, beginning at about 25 cm from the       mouth. Biopsies were taken with a cold forceps for histology. Estimated       blood loss was minimal.      Gastroesophageal reflux is evident by free flow of gastric contents in       the lower third of the esophagus.      A 1-2 cm hiatal hernia was present.      There is no endoscopic evidence of stricture in the distal esophagus.       Therefore, dilatation was not performed.      Bilious fluid was found in the gastric fundus.      Six non-bleeding cratered benign-appearing gastric ulcers, all with dark       (almost black) pigmented material at the base of the ulcers, were found       in the gastric body. The largest lesion was 10 mm in largest dimension.  Biopsies were taken with a cold forceps for histology. Estimated blood       loss was minimal.      The exam of the stomach was otherwise normal.      The cardia and gastric fundus were normal on retroflexion.      Diffuse moderate mucosal changes characterized by congestion, erythema       and nodularity were found in the duodenal bulb and in the second portion       of the duodenum. Biopsies were taken with a cold forceps for histology.       Estimated blood loss was minimal.      Two  non-bleeding superficial duodenal ulcers with no stigmata of       bleeding were found in the duodenal bulb and in the second portion of       the duodenum. The largest lesion was 12 mm in largest dimension. Impression:               - Normal larynx.                           - Eroded, inflamed, ulcerated mucosa in the                            esophagus. Biopsied.                           - GERD, as evident by free flow of gastric contents                            into the esophagus.                           - 1 cm hiatal hernia.                           - Bilious gastric fluid.                           - Non-bleeding gastric ulcers with pigmented                            material. Biopsied.                           - Mucosal changes in the duodenum. Biopsied.                           - Multiple non-bleeding duodenal ulcers with no                            stigmata of bleeding.                           - The presence of ulcerations despite ongoing PPI                            therapy is atypical and quite concerning for some  sort of non-peptic etiology. Recommendation:           - Await pathology results.                           - Increase Protonix to twice a day. Procedure Code(s):        --- Professional ---                           [phone], Esophagogastroduodenoscopy, flexible,                            transoral; with biopsy, single or multiple Diagnosis Code(s):        --- Professional ---                           K20.9, Esophagitis, unspecified                           K22.10, Ulcer of esophagus without bleeding                           K25.9, Gastric ulcer, unspecified as acute or                            chronic, without hemorrhage or perforation CPT copyright 2017 [employer] Association. All rights reserved. The codes documented in this report are preliminary and upon coder review may  be revised to meet current  compliance requirements. [name], MD 03/21/2018 1:56:27 PM This report has been signed electronically. Number of Addenda: 0

## 2018-03-21 NOTE — Anesthesia Preprocedure Evaluation (Signed)
Anesthesia Evaluation  Patient identified by MRN, date of birth, ID band Patient awake    Reviewed: Allergy & Precautions, H&P , NPO status , Patient's Chart, lab work & pertinent test results  Airway Mallampati: II   Neck ROM: full    Dental   Pulmonary sleep apnea ,    breath sounds clear to auscultation       Cardiovascular hypertension, + angina + CAD, + Past MI, + Cardiac Stents and +CHF   Rhythm:regular Rate:Normal  EF 45%   Neuro/Psych PSYCHIATRIC DISORDERS Depression  Neuromuscular disease    GI/Hepatic GERD  ,  Endo/Other  diabetes, Type 2Morbid obesity  Renal/GU Renal InsufficiencyRenal disease     Musculoskeletal   Abdominal   Peds  Hematology   Anesthesia Other Findings   Reproductive/Obstetrics                             Anesthesia Physical Anesthesia Plan  ASA: III  Anesthesia Plan: MAC   Post-op Pain Management:    Induction: Intravenous  PONV Risk Score and Plan: 1 and Propofol infusion and Treatment may vary due to age or medical condition  Airway Management Planned: Nasal Cannula  Additional Equipment:   Intra-op Plan:   Post-operative Plan:   Informed Consent: I have reviewed the patients History and Physical, chart, labs and discussed the procedure including the risks, benefits and alternatives for the proposed anesthesia with the patient or authorized representative who has indicated his/her understanding and acceptance.     Plan Discussed with: CRNA and Anesthesiologist  Anesthesia Plan Comments:         Anesthesia Quick Evaluation

## 2018-03-21 NOTE — Anesthesia Procedure Notes (Signed)
Procedure Name: MAC Date/Time: 03/21/2018 1:21 PM Performed by: Lance Coon, CRNA Pre-anesthesia Checklist: Patient identified, Emergency Drugs available, Suction available, Patient being monitored and Timeout performed Patient Re-evaluated:Patient Re-evaluated prior to induction Oxygen Delivery Method: Nasal cannula

## 2018-03-21 NOTE — Progress Notes (Addendum)
PROGRESS NOTE  Joshua [name] QAS:[id number] DOB: 23-Jun-1956 DOA: 03/14/2018 PCP: Mack Hook, MD  HPI/Recap of past 24 hours: Joshua [name] is a 62 y.o. male with medical history significant of diabetes, chronic kidney disease, chronic congestive heart failure, gastroparesis is brought in by his wife because of over 2 days of worsening confusion and totally being out of it.  She reports that he has had a gastroparesis flare where he has had a lot of nausea and vomiting the last several days and she thought he was taking his medication but apparently he was not.  He has not taken his meds for several days including his insulin.  Today he was extremely confused and acting very abnormal so she brought him to the ED.  Patient is found to have a sugar of over 1200 with a temperature of 85 degrees rectally.  She does not know of any fevers that he has been having.  She does not know of any diarrhea.  Patient is referred for admission for profound DKA dehydration and possible sepsis.  She denies him having any rashes.  All history is obtained from wife as patient cannot provide any history due to altered mentation.  03/15/18: seen and examined at his bedside in the ED. His wife and son are in the room. Majority of the story obtained from his wife. Patient has been noncompliant with all his medications, including insulin, asa and brillinta. Wife reports patient is a heavy drinker and stopped alcohol use about 1 and half weeks ago.   Severe renal failure on presentation with urine retention and oliguria. Obtain renal ultrasound and consult nephrology.  03/16/18: Patient seen and examined this morning at his bedside. He is alert but minimally interactive. Persistent altered mental status. Worsening hypernatremia. NG tube ordered for free water flushes. On 1/2 NS switched IV fluid to D5 1/4NS to maintain hydration and improve hypernatremia. BMP q4H. Per RN, patient has clear speech. Because of that would  not pursue further brain imaging.  03/17/18: Seen and examined at his bedside.  Patient is alert and oriented x.3.  States he is hungry.  We will transition him to long-acting insulin and allow for oral nutrition.  03/18/18: Seen and examined at his bedside.  He reports diffused abdominal pain.  Poor historian and unable to elaborate further.  Reglan started for suspected gastroparesis. Contacted [facility] for further assessment.  03/19/18: seen and examined at his bedside. Complaints of food getting stuck in his throat. Reports this is chronic. GI will see in the am. NPO after midnight.  03/20/18: persistent GI complaints. Dysphagia with food stuck in his throat. GI consulted. Esophagram ordered.  03/21/18: In good spirit this am. No new complaints. Planned for EGD today.  Update: Spoke with Dr. Carma Leaven who reports EGD revealed extensive esophagitis, gastric ulcer, and duodenal ulcers concerning for non peptic etiology.  Biopsies pending.  Started the patient on Protonix twice daily and full liquid diets.    GI recommends the patient be discharged once he can tolerate full liquid diet and follow up with his gastroenterologist Dr. Michail Sermon post hospitalization.  Assessment/Plan: Principal Problem:   Sepsis ([place]) Active Problems:   CAD S/P multiple PCIs   Chronic combined systolic and diastolic congestive heart failure (HCC)   Acute renal failure superimposed on stage 3 chronic kidney disease (HCC)   Essential hypertension   DKA (diabetic ketoacidoses) (HCC)  Acute metabolic encephalopathy most likely multifactorial 2/2 to DKA vs dehydration vs AKI vs others,  resolved Anion gap Remains closed. lantus increased from 20 U to 24 U BID Novolog increased from 6 U to 13 U TID Continue ISS moderate scale Encourage po fluid intake  Irregular esophageal narrowing  With concern for malignancy EGD planned on 03/21/18 GI following Dr Carma Leaven. Highly appreciated  Type 2 diabetes uncontrolled with  hyperglycemia, improving  Last hemoglobin A1c greater than 15.5 on 03/15/2018 C/w lantus 24 U BID C/w Novolog 13U TID Continue moderate scale ISS Avoid hypoglycemia  Chronic dyspepsia/dysphagia [facility] consulted Last visit a year ago (colonoscopy) Esophagram ordered and revealed esophageal dysmotility with irregular esophageal narrowing raising the question of possible malignancy GI, Dr. Carma Leaven, planning EGD today Continue protonix po daily  Hypovolemic hypernatremia likely secondary to severe dehydration, resolved Na+ 143 from 147 peaked at 155. Encourage po fluid intake  AKI on CKD 3, improving Baseline creatinine 1.7 Cr 1.75 from 1.87 Cr peaked at 5.25 Avoid nephrotoxic agents/hypotension/dehydration Continue to monitor urine output Repeat BMP in the morning  Severe hypophosphatemia, improving Phos 2.2 from 1.0 Repleted IV with kphos  Severe protein calorie malnutrition,  Improving Albumin 2.3 from 2.2 from 2.1 Oral supplementation  DKA, resolved Anion gap has closed  Hypokalemia, resolved K+ 4.0 from 3.4 Repleted with oral supplement  Severe dehydration, resolved Continue to encourage p.o. fluid intake  Coronary artery disease status post PCI Denies chest pain Continue aspirin and Brilinta Continue statin Patient stopped taking his Brilinta for 5 days prior to presentation  Urinary retention status post Foley placement Renal ultrasound did not reveal any acute findings.  No obstruction or hydronephrosis mentioned Continue to monitor urine output Remove foley cath 03/19/18  Leukocytosis, possibly reactive, resolved Wbc 10 from 13 Afebrile Urinalysis negative CT abdomen and pelvis unremarkable Chest x-ray unremarkable for any lobular infiltrates DC IV vancomycin and IV Zosyn and continue to closely monitor  Chronic combined systolic and diastolic heart failure Will need to continue IV hydration due to severe dehydration Monitor O2 saturation, vital  signs Continue telemetry Last 2D echo 07/31/15 LVEF 55-60%  Morbid obesity BMI 41 Weight loss outpatient  Medical noncompliance Per his wife, patient had stopped taking all his medications 5 days prior to presentation  History of alcohol abuse Per his wife patient stopped alcohol use a week and half prior to his presentation Continue folate and thiamine Alcohol withdrawal protocol    Code Status: Full  Family Communication: None at bedside. Spoke with his wife in person on 03/15/18.   Disposition Plan: Home when clinically stable.   Consultants:  [facility] Dr Carma Leaven  Procedures:  none   Antimicrobials:  Dc IV vanc and IV zosyn   DVT prophylaxis:  SCDs, sq heparin 5000 u tid   Objective: Vitals:   03/21/18 0625 03/21/18 0700 03/21/18 1041 03/21/18 1234  BP:  111/84 97/69 125/79  Pulse:  77 78   Resp:  $Remo'12 11 16  'vWmMD$ Temp:  98.8 F (37.1 C) 98.7 F (37.1 C) 98.4 F (36.9 C)  TempSrc:  Oral Oral Oral  SpO2:  98% 99% 100%  Weight: 113.5 kg (250 lb 4.8 oz)   113.4 kg (250 lb)  Height:    '5\' 4"'$  (1.626 m)    Intake/Output Summary (Last 24 hours) at 03/21/2018 1329 Last data filed at 03/21/2018 1329 Gross per 24 hour  Intake 685 ml  Output 1100 ml  Net -415 ml   Filed Weights   03/20/18 0525 03/21/18 0625 03/21/18 1234  Weight: 111.9 kg (246 lb 11.1 oz) 113.5  kg (250 lb 4.8 oz) 113.4 kg (250 lb)     Exam: 03/21/18  General: 62 yo AAM WD WN NAd A&O x 3  Cardiovascular: RRR o rubs or gallops. No JVD or thyromegaly  Respiratory: CTA no wheezes or rales  Abdomen: soft non tender non distended with NBS x 4  Musculoskeletal: right forefoot amputation with a well healed scar.  Skin: As noted above  Psychiatry: Mood is appropriate for condition and setting.   Data Reviewed: CBC: Recent Labs  Lab 03/14/18 2005  03/16/18 0748 03/17/18 1016 03/19/18 0500 03/20/18 0530 03/21/18 0800  WBC 16.4*  --  15.6* 13.5* 10.9* 10.6* 9.9  NEUTROABS 12.6*  --   --    --   --   --   --   HGB 15.9   < > 14.2 12.9* 11.6* 11.8* 11.6*  HCT 52.0   < > 40.3 39.4 36.6* 37.8* 37.1*  MCV 96.3  --  83.1 87.0 87.4 88.1 88.5  PLT 191  --  140* 127* 119* 127* 126*   < > = values in this interval not displayed.   Basic Metabolic Panel: Recent Labs  Lab 03/17/18 0537  03/17/18 1658 03/17/18 2331 03/18/18 0230 03/19/18 0500 03/20/18 0530 03/21/18 0800  NA 150*   < > 149* 134* 149* 147* 143 146*  K 3.8   < > 4.2 3.6 3.8 3.6 3.4* 4.0  CL 113*   < > 112* 103 114* 112* 109 115*  CO2 26   < > $R'26 24 27 25 23 24  'Uf$ GLUCOSE 136*   < > 359* 726* 233* 227* 145* 127*  BUN 49*   < > 41* 38* 37* 31* 30* 22*  CREATININE 1.73*   < > 1.84* 1.93* 1.92* 1.87* 1.75* 1.61*  CALCIUM 8.1*   < > 8.0* 7.3* 8.2* 8.0* 8.1* 8.5*  MG  --   --   --   --   --   --  2.3  --   PHOS 1.0*  --  1.3*  --   --   --  2.2*  --    < > = values in this interval not displayed.   GFR: Estimated Creatinine Clearance: 56.5 mL/min (A) (by C-G formula based on SCr of 1.61 mg/dL (H)). Liver Function Tests: Recent Labs  Lab 03/14/18 2005 03/17/18 0537 03/17/18 2331 03/19/18 0500 03/20/18 0530 03/21/18 0800  AST 21  --  $R'26 22 25 'Wv$ 33  ALT 17  --  16* $Re'17 18 25  'ftv$ ALKPHOS 123  --  91 99 107 108  BILITOT 1.9*  --  0.5 0.5 0.4 0.2*  PROT 5.9*  --  4.6* 5.5* 5.6* 5.6*  ALBUMIN 2.8* 2.1* 1.9* 2.2* 2.3* 2.2*   Recent Labs  Lab 03/14/18 2250 03/17/18 2331  LIPASE 176* 71*   Recent Labs  Lab 03/14/18 2250  AMMONIA 32   Coagulation Profile: No results for input(s): INR, PROTIME in the last 168 hours. Cardiac Enzymes: Recent Labs  Lab 03/14/18 2057 03/15/18 0045 03/15/18 0544 03/15/18 1236  CKTOTAL 620*  --   --   --   TROPONINI  --  0.03* 0.05* 0.09*   BNP (last 3 results) No results for input(s): PROBNP in the last 8760 hours. HbA1C: No results for input(s): HGBA1C in the last 72 hours. CBG: Recent Labs  Lab 03/20/18 1640 03/20/18 1724 03/20/18 2103 03/21/18 0758 03/21/18 1137    GLUCAP 62* 103* 207* 113* 137*   Lipid Profile: No  results for input(s): CHOL, HDL, LDLCALC, TRIG, CHOLHDL, LDLDIRECT in the last 72 hours. Thyroid Function Tests: No results for input(s): TSH, T4TOTAL, FREET4, T3FREE, THYROIDAB in the last 72 hours. Anemia Panel: No results for input(s): VITAMINB12, FOLATE, FERRITIN, TIBC, IRON, RETICCTPCT in the last 72 hours. Urine analysis:    Component Value Date/Time   COLORURINE YELLOW 03/14/2018 2145   APPEARANCEUR HAZY (A) 03/14/2018 2145   LABSPEC 1.018 03/14/2018 2145   PHURINE 5.0 03/14/2018 2145   GLUCOSEU >=500 (A) 03/14/2018 2145   HGBUR SMALL (A) 03/14/2018 2145   BILIRUBINUR NEGATIVE 03/14/2018 2145   KETONESUR 5 (A) 03/14/2018 2145   PROTEINUR 100 (A) 03/14/2018 2145   UROBILINOGEN 0.2 11/01/2011 1410   NITRITE NEGATIVE 03/14/2018 2145   LEUKOCYTESUR NEGATIVE 03/14/2018 2145   Sepsis Labs: $RemoveBefo'@[email or web address]'oqoNYAULgqf$ (procalcitonin:4,lacticidven:4)  ) Recent Results (from the past 240 hour(s))  Blood Culture (routine x 2)     Status: None   Collection Time: 03/14/18  9:17 PM  Result Value Ref Range Status   Specimen Description BLOOD RIGHT HAND  Final   Special Requests IN PEDIATRIC BOTTLE Blood Culture adequate volume  Final   Culture   Final    NO GROWTH 5 DAYS Performed at [facility], [place] [address] [place], [place] [address]    Report Status 03/19/2018 FINAL  Final  Urine culture     Status: None   Collection Time: 03/14/18  9:39 PM  Result Value Ref Range Status   Specimen Description URINE, CLEAN CATCH  Final   Special Requests NONE  Final   Culture   Final    NO GROWTH Performed at [facility], [place] [address], [place], [place] [address]    Report Status 03/16/2018 FINAL  Final  Blood Culture (routine x 2)     Status: None   Collection Time: 03/14/18 10:50 PM  Result Value Ref Range Status   Specimen Description BLOOD LEFT ANTECUBITAL  Final   Special Requests   Final    BOTTLES DRAWN AEROBIC AND ANAEROBIC  Blood Culture adequate volume   Culture   Final    NO GROWTH 5 DAYS Performed at [facility], [place] [address] [place], [place] [address]    Report Status 03/20/2018 FINAL  Final  MRSA PCR Screening     Status: None   Collection Time: 03/15/18  7:25 AM  Result Value Ref Range Status   MRSA by PCR NEGATIVE NEGATIVE Final    Comment:        The GeneXpert MRSA Assay (FDA approved for NASAL specimens only), is one component of a comprehensive MRSA colonization surveillance program. It is not intended to diagnose MRSA infection nor to guide or monitor treatment for MRSA infections. Performed at [facility], [place] [address] [place], [place] [address]       Studies: Dg Esophagus  Result Date: 03/20/2018 CLINICAL DATA:  Dysphagia.  Pain and food sticking in mid chest EXAM: ESOPHOGRAM/BARIUM SWALLOW TECHNIQUE: Single contrast examination was performed using thin barium. A 13 mm barium tablet was also given. FLUOROSCOPY TIME:  Fluoroscopy Time:  1 minutes 18 seconds Radiation Exposure Index (if provided by the fluoroscopic device): Number of Acquired Spot Images: 0 COMPARISON:  Chest CT 07/18/2016 FINDINGS: There is a small hiatal hernia. Disruption of all primary esophageal peristaltic waves with stasis. There is irregular luminal narrowing within the distal esophagus just above the hernia. The 13 mm barium tablet sticks just above this area of narrowing,  thin also sticks in the distal esophagus at this area of irregular narrowing. This reproduces the patient's severe pain symptoms. IMPRESSION: Esophageal dysmotility. Irregular stricture in the distal esophagus just above a small hiatal hernia. The 13 mm barium tablet sticks several cm proximal to this area, then again at this area of distal esophageal narrowing. Cannot exclude esophageal neoplasm. Recommend further evaluation with endoscopy. Electronically Signed   By: Rolm Baptise M.D.   On: 03/20/2018 14:08    Scheduled Meds: .  [MAR Hold] amLODipine  10 mg Oral Daily  . [MAR Hold] aspirin EC  81 mg Oral QHS  . [MAR Hold] folic acid  1 mg Oral Daily  . [MAR Hold] heparin  5,000 Units Subcutaneous Q8H  . [MAR Hold] insulin aspart  0-15 Units Subcutaneous TID WC  . [MAR Hold] insulin aspart  0-5 Units Subcutaneous QHS  . [MAR Hold] insulin aspart  13 Units Subcutaneous TID AC  . [MAR Hold] insulin glargine  24 Units Subcutaneous BID  . [MAR Hold] isosorbide mononitrate  120 mg Oral Daily  . [MAR Hold] metoCLOPramide  10 mg Oral TID AC  . [MAR Hold] metoprolol succinate  150 mg Oral QPM  . [MAR Hold] multivitamin with minerals  1 tablet Oral Daily  . [MAR Hold] pantoprazole  40 mg Oral Daily  . [MAR Hold] polyethylene glycol  17 g Oral Daily  . [MAR Hold] sennosides  5 mL Oral BID  . [MAR Hold] sodium chloride flush  10-40 mL Intracatheter Q12H  . [MAR Hold] thiamine  100 mg Oral Daily  . [MAR Hold] ticagrelor  90 mg Oral BID    Continuous Infusions: . sodium chloride 20 mL/hr at 03/20/18 1945     LOS: 6 days     Kayleen Memos, MD [facility] Pager [phone]  If 7PM-7AM, please contact night-coverage [email or web address] Password [email or web address] 03/21/2018, 1:29 PM

## 2018-03-21 NOTE — Progress Notes (Signed)
Nutrition Follow-up  DOCUMENTATION CODES:   Morbid obesity  INTERVENTION:   -RD will follow for diet advancement and add supplement as appropriate -RD will follow-up with education needs as appropriate  NUTRITION DIAGNOSIS:   Increased nutrient needs related to chronic illness as evidenced by estimated needs.  Ongoing  GOAL:   Patient will meet greater than or equal to 90% of their needs  Progressing  MONITOR:   Diet advancement, Weight trends, I & O's, Labs  REASON FOR ASSESSMENT:   Consult, Malnutrition Screening Tool Assessment of nutrition requirement/status  ASSESSMENT:   62 y.o. M admitted for altered mental status and DKA dehydration with possible sepsis due to hypothermia. PMH of T2DM, CKD stg III, CHF, CAD, and gastroparesis.   4/4- s/p BSE advanced to thin liquids, PICC placed 4/5- advanced to dysphagia 2 diet with thin liquids per SLP 4/8- downgraded to full liquid diet  Case discussed with RN; pt currently NPO for EGD today. Pt with possible stricture; complains of heartburn and foods "getting stuck". Per RN, mentation is improved, however, suspect continued cognitive deficits.   Spoke with pt, who reports feeling better today. He confirmed swallowing difficulties and spoke about plan for EGD without probing for EGD. He reports minimal intake related to swallowing difficulty; meal completion variable (PO: 0-80%) per doc flowsheet records.   RNCM assisting pt with follow-up appointments and medication assistance. Pt more alert, however, pt still inappropriate for DM education at this time. Will attempt to follow-up with education needs as mentation improves and/or family/caregivers present.   Labs reviewed: Na: 146, CBGS: 103-207 (inpatient orders for glycemic control are 0-15 units insulin aspart TID with meals, 0-5 units insulin aspart q HS, 13 units insulin aspart TID before meals, and 24 units insulin glargine BID).    NUTRITION - FOCUSED PHYSICAL  EXAM:    Most Recent Value  Orbital Region  No depletion  Upper Arm Region  No depletion  Thoracic and Lumbar Region  No depletion  Buccal Region  No depletion  Temple Region  No depletion  Clavicle Bone Region  No depletion  Clavicle and Acromion Bone Region  No depletion  Scapular Bone Region  No depletion  Dorsal Hand  No depletion  Patellar Region  No depletion  Anterior Thigh Region  No depletion  Posterior Calf Region  No depletion  Edema (RD Assessment)  Mild  Hair  Reviewed  Eyes  Reviewed  Mouth  Reviewed  Skin  Reviewed  Nails  Reviewed       Diet Order:  Fall precautions Diet NPO time specified Except for: Sips with Meds  EDUCATION NEEDS:   No education needs have been identified at this time  Skin:  Skin Assessment: Reviewed RN Assessment  Last BM:  03/18/18  Height:   Ht Readings from Last 1 Encounters:  03/15/18 $RemoveB'5\' 4"'jsQIhHjZ$  (1.626 m)    Weight:   Wt Readings from Last 1 Encounters:  03/21/18 250 lb 4.8 oz (113.5 kg)    Ideal Body Weight:  59 kg  BMI:  Body mass index is 42.96 kg/m.  Estimated Nutritional Needs:   Kcal:  1650-1850 kcal  Protein:  80-95grams  Fluid:  >1.7 L or per MD    Tayvon Culley A. Jimmye Norman, RD, LDN, CDE Pager: [phone] After hours Pager: [phone]

## 2018-03-21 NOTE — Progress Notes (Signed)
  Speech Language Pathology Treatment: Dysphagia  Patient Details Name: Joshua [name] MRN: [id number] DOB: 1956-03-17 Today's Date: 03/21/2018 Time: 1445-1500 SLP Time Calculation (min) (ACUTE ONLY): 15 min  Assessment / Plan / Recommendation Clinical Impression  EGD completed today and per Dr. Osborn Coho note showed diffuse mid and distal esophageal ulcerations, multiple gastric ulcerations, and even some duodenal ulcerations and mucosal edema. This raises the possibility of a non-peptic etiology for the patient's symptoms. There was no evident esophageal stricture. Pt reports warmer liquids continue to transfer esophagus with less symptoms. Educated pt to use caution with acidic foods, caffeine and general esophageal precautions and will continue with full liquids. He reported having significantly decreased symptoms when on Reglan. No difficulty consuming hot/warm tea. Will continue to follow for advancement of po's and continued education.      HPI HPI: Joshua [name] a 62 y.o.malewith medical history significant ofdiabetes, chronic kidney disease, chronic congestive heart failure, gastroparesis is brought in by his wife because of over 2 days of worsening confusion and totally being out of it. Pt presented to ED with a sugar of over 1200 with a temperature of 85 degrees rectally. Pt admitted for DKA, sepsis, and severe renal failure with urine retention and oliguria. Normal head CT. CXR revealed no active disease.      SLP Plan  Continue with current plan of care       Recommendations  Diet recommendations: Thin liquid(full liquids) Liquids provided via: Cup;Straw Medication Administration: Crushed with puree Supervision: Patient able to self feed Compensations: Slow rate;Small sips/bites Postural Changes and/or Swallow Maneuvers: Seated upright 90 degrees;Upright 30-60 min after meal                Oral Care Recommendations: Oral care BID Follow up Recommendations:  None SLP Visit Diagnosis: Dysphagia, unspecified (R13.10) Plan: Continue with current plan of care       GO                Houston Siren 03/21/2018, 3:16 PM  Orbie Pyo Aadil Sur M.Ed [employer] [phone]

## 2018-03-21 NOTE — Progress Notes (Signed)
EGD was well-tolerated, but shows concerning findings in the face of antipeptic therapy, including diffuse mid and distal esophageal ulcerations, multiple gastric ulcerations, and even some duodenal ulcerations and mucosal edema.  This raises the possibility of a non-peptic etiology for the patient's symptoms.  There was no evident esophageal stricture so no dilatation was performed.  Presumably, the radiographic findings on yesterday's barium swallow were due to esophageal muscular spasm related to the severe inflammation.  I have increased the patient's Protonix while awaiting the biopsy results.  I have also ordered a full liquid diet.  [name], M.D. Pager [phone] If no answer or after 5 PM call [phone]

## 2018-03-21 NOTE — Transfer of Care (Signed)
Immediate Anesthesia Transfer of Care Note  Patient: STORY VANVRANKEN  Procedure(s) Performed: ESOPHAGOGASTRODUODENOSCOPY (EGD) WITH PROPOFOL (N/A ) BALLOON DILATION (N/A )  Patient Location: Endoscopy Unit  Anesthesia Type:MAC  Level of Consciousness: awake and patient cooperative  Airway & Oxygen Therapy: Patient Spontanous Breathing  Post-op Assessment: Report given to RN and Post -op Vital signs reviewed and stable  Post vital signs: Reviewed and stable  Last Vitals:  Vitals Value Taken Time  BP    Temp    Pulse    Resp    SpO2      Last Pain:  Vitals:   03/21/18 1234  TempSrc: Oral  PainSc: 0-No pain         Complications: No apparent anesthesia complications

## 2018-03-22 DIAGNOSIS — R4182 Altered mental status, unspecified: Secondary | ICD-10-CM

## 2018-03-22 DIAGNOSIS — N179 Acute kidney failure, unspecified: Secondary | ICD-10-CM

## 2018-03-22 DIAGNOSIS — A419 Sepsis, unspecified organism: Secondary | ICD-10-CM

## 2018-03-22 DIAGNOSIS — N183 Chronic kidney disease, stage 3 (moderate): Secondary | ICD-10-CM

## 2018-03-22 LAB — CBC
HCT: 35.4 % — ABNORMAL LOW (ref 39.0–52.0)
Hemoglobin: 10.9 g/dL — ABNORMAL LOW (ref 13.0–17.0)
MCH: 27.5 pg (ref 26.0–34.0)
MCHC: 30.8 g/dL (ref 30.0–36.0)
MCV: 89.2 fL (ref 78.0–100.0)
Platelets: 140 10*3/uL — ABNORMAL LOW (ref 150–400)
RBC: 3.97 MIL/uL — ABNORMAL LOW (ref 4.22–5.81)
RDW: 14.9 % (ref 11.5–15.5)
WBC: 6.5 10*3/uL (ref 4.0–10.5)

## 2018-03-22 LAB — COMPREHENSIVE METABOLIC PANEL
ALT: 23 U/L (ref 17–63)
AST: 23 U/L (ref 15–41)
Albumin: 2 g/dL — ABNORMAL LOW (ref 3.5–5.0)
Alkaline Phosphatase: 106 U/L (ref 38–126)
Anion gap: 9 (ref 5–15)
BUN: 22 mg/dL — ABNORMAL HIGH (ref 6–20)
CO2: 23 mmol/L (ref 22–32)
Calcium: 8.2 mg/dL — ABNORMAL LOW (ref 8.9–10.3)
Chloride: 108 mmol/L (ref 101–111)
Creatinine, Ser: 1.62 mg/dL — ABNORMAL HIGH (ref 0.61–1.24)
GFR calc Af Amer: 52 mL/min — ABNORMAL LOW (ref >60)
GFR calc non Af Amer: 45 mL/min — ABNORMAL LOW (ref >60)
Glucose, Bld: 325 mg/dL — ABNORMAL HIGH (ref 65–99)
Potassium: 3.9 mmol/L (ref 3.5–5.1)
Sodium: 140 mmol/L (ref 135–145)
Total Bilirubin: 0.4 mg/dL (ref 0.3–1.2)
Total Protein: 5.4 g/dL — ABNORMAL LOW (ref 6.5–8.1)

## 2018-03-22 LAB — GLUCOSE, CAPILLARY
Glucose-Capillary: 242 mg/dL — ABNORMAL HIGH (ref 65–99)
Glucose-Capillary: 196 mg/dL — ABNORMAL HIGH (ref 65–99)

## 2018-03-22 MED ORDER — ISOSORBIDE MONONITRATE ER 120 MG PO TB24: 120.0000 mg | ORAL_TABLET | Freq: Every day | ORAL | 2 refills | Status: DC

## 2018-03-22 MED ORDER — PANTOPRAZOLE SODIUM 40 MG PO TBEC: 40.0000 mg | DELAYED_RELEASE_TABLET | Freq: Two times a day (BID) | ORAL | 3 refills | Status: DC

## 2018-03-22 MED ORDER — INSULIN REGULAR HUMAN 100 UNIT/ML IJ SOLN: INTRAMUSCULAR | 11 refills | Status: DC

## 2018-03-22 MED ORDER — BLOOD GLUCOSE MONITOR KIT: PACK | 0 refills | Status: DC

## 2018-03-22 MED ORDER — HYDRALAZINE HCL 50 MG PO TABS: 50.0000 mg | ORAL_TABLET | Freq: Three times a day (TID) | ORAL | 11 refills | Status: DC

## 2018-03-22 MED ORDER — INSULIN NPH (HUMAN) (ISOPHANE) 100 UNIT/ML ~~LOC~~ SUSP: 22.0000 [IU] | Freq: Two times a day (BID) | SUBCUTANEOUS | 11 refills | Status: DC

## 2018-03-22 MED ORDER — AMLODIPINE BESYLATE 10 MG PO TABS: 10.0000 mg | ORAL_TABLET | Freq: Every day | ORAL | 1 refills | Status: DC

## 2018-03-22 MED ORDER — METOCLOPRAMIDE HCL 10 MG PO TABS: 10.0000 mg | ORAL_TABLET | Freq: Three times a day (TID) | ORAL | 1 refills | Status: DC | PRN

## 2018-03-22 MED ORDER — INSULIN NPH ISOPHANE & REGULAR (70-30) 100 UNIT/ML ~~LOC~~ SUSP: 34.0000 [IU] | Freq: Two times a day (BID) | SUBCUTANEOUS | 11 refills | Status: DC

## 2018-03-22 NOTE — Progress Notes (Signed)
Patient was just eating his first solid meal when I came into the room.  Initial indications are that he is tolerating soft solid food okay.  Biopsy still pending.  Plan for discharge noted.  We will contact patient with his biopsy report  and probably arrange follow-up with his primary gastroenterologist, Dr. Cannon Kettle, at that time.  [name], M.D. Pager [phone] If no answer or after 5 PM call [phone]

## 2018-03-22 NOTE — Care Management Note (Signed)
Case Management Note  Patient Details  Name: Joshua [name] MRN: [id number] Date of Birth: 1956/05/20  Subjective/Objective:   Pt admitted with DKA                  Action/Plan:   PTA independent from home.  Pt recently lost job and health insurance.  CM confirmed with PCP office of [name] that pt is still active with practice.  Practice informed CM that they allow pt to pay $50 upfront and get billed for remainder of cost, office also facilitates medication assistance.  CM was unable to make appt at lower cost clinics for [facility] however CM provided clinic info on AVS and instructed pt to contact Monday if he will not be able to afford $50 copay at PCP. Pt will likely need MATCH at discharge   Expected Discharge Date:  03/22/18               Expected Discharge Plan:  Home/Self Care  In-House Referral:     Discharge planning Services  CM Consult  Post Acute Care Choice:    Choice offered to:     DME Arranged:    DME Agency:     HH Arranged:    HH Agency:     Status of Service:  In process, will continue to follow  If discussed at Long Length of Stay Meetings, dates discussed:    Additional Comments: 03/22/2018  Pt has appt with [facility] 5/8.  CM spoke with pt in detail regarding clinic appt and need to go to clinic on 4/18 and apply for orange card.  CM provided [place] letter and explained that if he goes to [place] at discharge he can utilize [facility] program and request diabetic supply assistance.  Pt was unsure if he had utilized free 30 day Brilinta card - CM provided card, pt also informed CM that he has ample supply of Brilinta at home.  CM informed by attending that medication reconciliation has been completed and communicated directly with pt - verbiage provided on AVS by attending when pt is to began taking new insulin 70/30.  Maryclare Labrador, RN 03/22/2018, 2:43 PM

## 2018-03-22 NOTE — Progress Notes (Signed)
  Speech Language Pathology Treatment: Dysphagia  Patient Details Name: Joshua [name] MRN: [id number] DOB: 05-Sep-1956 Today's Date: 03/22/2018 Time: 4103-0131 SLP Time Calculation (min) (ACUTE ONLY): 8 min  Assessment / Plan / Recommendation Clinical Impression  MD upgraded pt's diet texture to regular. He reported MD came when eating lunch; pt experiencing pain and MD suggested smaller bites and warming liquids in oral cavity before swallowing and pt reported relief. Pt being discharged today and SLP educated foods to avoid and/or use caution and reviewed precautions. No further ST needed.     HPI HPI: Joshua [name] a 62 y.o.malewith medical history significant ofdiabetes, chronic kidney disease, chronic congestive heart failure, gastroparesis is brought in by his wife because of over 2 days of worsening confusion and totally being out of it. Pt presented to ED with a sugar of over 1200 with a temperature of 85 degrees rectally. Pt admitted for DKA, sepsis, and severe renal failure with urine retention and oliguria. Normal head CT. CXR revealed no active disease.      SLP Plan  All goals met       Recommendations  Diet recommendations: Thin liquid(soft) Liquids provided via: Cup;Straw Medication Administration: Crushed with puree Supervision: Patient able to self feed Compensations: Slow rate;Small sips/bites Postural Changes and/or Swallow Maneuvers: Seated upright 90 degrees;Upright 30-60 min after meal                Oral Care Recommendations: Oral care BID Follow up Recommendations: None SLP Visit Diagnosis: Dysphagia, unspecified (R13.10) Plan: All goals met       GO                [place] Siren 03/22/2018, 4:17 PM   Orbie Pyo Jodee Wagenaar M.Ed [employer] [phone]

## 2018-03-22 NOTE — Discharge Summary (Signed)
Physician Discharge Summary   Patient ID: Joshua [name] MRN: [id number] DOB/AGE: 1956/12/07 62 y.o.  Admit date: 03/14/2018 Discharge date: 03/22/2018  Primary Care Physician:  Mack Hook, MD   Recommendations for Outpatient Follow-up:  1. Follow up with PCP in 1-2 weeks 2. Please obtain BMP/CBC in one week 3. GI will follow up on the EGD biopsies  Home Health: None  Equipment/Devices: None  Discharge Condition: stable CODE STATUS: FULL  Diet recommendation: Carb modified diet   Discharge Diagnoses:    . Acute renal failure superimposed on stage 3 chronic kidney disease ([place]) secondary to profound dehydration . Chronic combined systolic and diastolic congestive heart failure ([place]) . Essential hypertension . Acute metabolic encephalopathy secondary to DKA, dehydration . DKA (diabetic ketoacidoses) (HCC) Chronic dyspepsia with dysphagia Hypovolemic hypernatremia Type 2 diabetes mellitus, uncontrolled Cardiac disease status post multiple PCI's  Consults: Gastroenterology Diabetic coordinator    Allergies:   Allergies  Allergen Reactions  . Chlorthalidone Other (See Comments)    Causes dehydration, kidneys shut down     DISCHARGE MEDICATIONS: Allergies as of 03/22/2018      Reactions   Chlorthalidone Other (See Comments)   Causes dehydration, kidneys shut down      Medication List    STOP taking these medications   hydrochlorothiazide 12.5 MG capsule Commonly known as:  MICROZIDE     TAKE these medications   acetaminophen 325 MG tablet Commonly known as:  TYLENOL Take 2 tablets (650 mg total) by mouth every 4 (four) hours as needed for headache or mild pain. What changed:  Another medication with the same name was removed. Continue taking this medication, and follow the directions you see here.   allopurinol 300 MG tablet Commonly known as:  ZYLOPRIM Take 300 mg by mouth daily.   amLODipine 10 MG tablet Commonly known as:  NORVASC Take 1  tablet (10 mg total) by mouth daily. HOLD until follow -up with your doctor (see instructions) What changed:  additional instructions   aspirin EC 81 MG tablet Take 81 mg by mouth at bedtime.   blood glucose meter kit and supplies Kit Dispense based on patient and insurance preference. Use up to four times daily as directed. (FOR ICD-9 250.00, 250.01).  RELION glucometer   BRILINTA 90 MG Tabs tablet Generic drug:  ticagrelor TAKE 1 TABLET BY MOUTH TWICE A DAY What changed:    how much to take  how to take this  when to take this   hydrALAZINE 50 MG tablet Commonly known as:  APRESOLINE Take 1 tablet (50 mg total) by mouth every 8 (eight) hours. HOLD until follow -up with your doctor (see instructions) What changed:  additional instructions   insulin NPH Human 100 UNIT/ML injection Commonly known as:  HUMULIN N,NOVOLIN N Inject 0.22 mLs (22 Units total) into the skin 2 (two) times daily before a meal. Please finish this insulin vial at home What changed:    how much to take  when to take this  additional instructions   insulin NPH-regular Human (70-30) 100 UNIT/ML injection Commonly known as:  NOVOLIN 70/30 RELION Inject 34 Units into the skin 2 (two) times daily with a meal. Please fill this prescription only when you have finished your insulin at home   insulin regular 100 units/mL injection Commonly known as:  NOVOLIN R,HUMULIN R Sliding scale  CBG 70 - 120: 0 units: CBG 121 - 150: 2 units; CBG 151 - 200: 3 units; CBG 201 - 250: 5 units;  CBG 251 - 300: 8 units;CBG 301 - 350: 11 units; CBG 351 - 400: 15 units; CBG > 400 : 15 units and notify your doctor  Please finish the insulin vial at home What changed:    how much to take  how to take this  when to take this  additional instructions   isosorbide mononitrate 120 MG 24 hr tablet Commonly known as:  IMDUR Take 1 tablet (120 mg total) by mouth daily. HOLD until follow -up with your doctor (see  instructions) What changed:  additional instructions   metoCLOPramide 10 MG tablet Commonly known as:  REGLAN Take 1 tablet (10 mg total) by mouth every 8 (eight) hours as needed for nausea.   metoprolol succinate 100 MG 24 hr tablet Commonly known as:  TOPROL XL Take 1 & 1/2  Tablets daily What changed:    how much to take  how to take this  when to take this  additional instructions   nitroGLYCERIN 0.4 MG SL tablet Commonly known as:  NITROSTAT Place 1 tablet (0.4 mg total) under the tongue every 5 (five) minutes as needed for chest pain.   pantoprazole 40 MG tablet Commonly known as:  PROTONIX Take 1 tablet (40 mg total) by mouth 2 (two) times daily before a meal. What changed:  when to take this   rosuvastatin 40 MG tablet Commonly known as:  CRESTOR Take 1 tablet (40 mg total) by mouth daily.        Brief H and P: For complete details please refer to admission H and P, but in brief patient is a 62 year old male with diabetes, CKD stage III, chronic CHF, gastroparesis brought to ED by his wife because of worsening confusion over 2 days.  Patient had not taken his medications for over 2 weeks including his insulin.  In ED, patient was found to be extremely confused, blood sugar of over 1200, temperature of 85 degrees rectally.  Patient was referred for admission for profound DKA, dehydration, possible sepsis, acute kidney injury.  Hospital Course:  DKA with severe dehydration in the setting of uncontrolled type 2 diabetes mellitus, insulin-dependent -Patient presented with CBG of 1241,  creatinine 5.25 at the time of admission, anion gap over 35, HCO3 <7.  Sepsis ruled out -Patient was admitted to stepdown unit, placed on aggressive IV fluid hydration, insulin drip -Currently improved, patient was transitioned to subcutaneous insulin with Lantus and NovoLog. Diabetic coordinator consult was obtained.,  Hemoglobin A1c >15.5  -Patient reported that he had lost his job,  hence had not taken insulin over 2 and half weeks.  Patient's wife reported that shebought vial of NPH insulin and Novolin R two days before his admission however he did not take it. -Recommended patient to finish his own insulin, changed NPH insulin 22 units BID and Novolin R with sliding scale.  Once he has finished his insulin at home, he can fill the prescription of [facility] brand relion NovoLog 70/30  34 units twice a day. -Patient was strongly recommended to check his blood sugars 4 times a day and keep a log, follow-up with his endocrinologist, Dr. Chalmers Cater  Active Problems:  Acute kidney injury superimposed on stage 3 chronic kidney disease ([place]) -Presented with creatinine function of 5.6 baseline ~ 1.5, follows nephrology, [name] -Likely acute kidney injury secondary to profound dehydration and DKA -Sepsis ruled out -Discontinued HCTZ    CAD S/P multiple PCIs -Currently no acute issues, continue aspirin, Brilinta, beta-blocker -Currently BP soft, continue beta-blocker,  patient given instructions in detail to add his antihypertensives per BP readings at home.    Chronic combined systolic and diastolic congestive heart failure (HCC) -Currently stable, euvolemic.  Last echo 07/2015 showed EF of 04-54%, grade 1 diastolic dysfunction.    Essential hypertension -BP currently soft, continue beta-blocker, given instructions to add his antihypertensives per BP readings at home.  Chronic dyspepsia/dysphagia -Patient underwent EGD which showed diffuse mid and distal esophageal ulcerations, multiple gastric ulcerations and duodenal ulcerations with mucosal edema.  Possibility of non-peptic etiology for patient's symptoms -Continue PPI twice daily, follow outpatient with GI   Day of Discharge S: Feels a lot better today, hoping to go home  BP (!) 137/94 (BP Location: Left Arm)   Pulse 79   Temp 98.7 F (37.1 C) (Oral)   Resp 17   Ht 5' 4" (1.626 m)   Wt 114.2 kg (251 lb 12.3 oz)   SpO2  98%   BMI 43.22 kg/m   Physical Exam: General: Alert and awake oriented x3 not in any acute distress. HEENT: anicteric sclera, pupils reactive to light and accommodation CVS: S1-S2 clear no murmur rubs or gallops Chest: clear to auscultation bilaterally, no wheezing rales or rhonchi Abdomen: soft nontender, nondistended, normal bowel sounds Extremities: no cyanosis, clubbing or edema noted bilaterally Neuro: Cranial nerves II-XII intact, no focal neurological deficits   The results of significant diagnostics from this hospitalization (including imaging, microbiology, ancillary and laboratory) are listed below for reference.      Procedures/Studies:  Ct Abdomen Pelvis Wo Contrast  Result Date: 03/15/2018 CLINICAL DATA:  Nausea and vomiting EXAM: CT ABDOMEN AND PELVIS WITHOUT CONTRAST TECHNIQUE: Multidetector CT imaging of the abdomen and pelvis was performed following the standard protocol without IV contrast. COMPARISON:  11/21/2017 FINDINGS: LOWER CHEST: No basilar pulmonary nodules or pleural effusion. No apical pericardial effusion. HEPATOBILIARY: Normal hepatic contours and density. No intra- or extrahepatic biliary dilatation. Normal gallbladder. PANCREAS: Normal parenchymal contours without ductal dilatation. No peripancreatic fluid collection. SPLEEN: Normal. ADRENALS/URINARY TRACT: --Adrenal glands: Normal. --Right kidney/ureter: No hydronephrosis, nephroureterolithiasis, perinephric stranding or solid renal mass. --Left kidney/ureter: No hydronephrosis, nephroureterolithiasis, perinephric stranding or solid renal mass. --Urinary bladder: Normal for degree of distention STOMACH/BOWEL: --Stomach/Duodenum: No hiatal hernia or other gastric abnormality. Normal duodenal course. --Small bowel: No dilatation or inflammation. --Colon: No focal abnormality. --Appendix: Normal. VASCULAR/LYMPHATIC: Normal course and caliber of the major abdominal vessels. No abdominal or pelvic lymphadenopathy.  REPRODUCTIVE: Normal prostate and seminal vesicles. MUSCULOSKELETAL. Multilevel degenerative disc disease and facet arthrosis. No bony spinal canal stenosis. OTHER: None. IMPRESSION: No acute abnormality of the abdomen or pelvis. Electronically Signed   By: Ulyses Jarred M.D.   On: 03/15/2018 00:11   Ct Head Wo Contrast  Result Date: 03/15/2018 CLINICAL DATA:  Altered mental status EXAM: CT HEAD WITHOUT CONTRAST TECHNIQUE: Contiguous axial images were obtained from the base of the skull through the vertex without intravenous contrast. COMPARISON:  12/07/2006 FINDINGS: Brain: No mass lesion, intraparenchymal hemorrhage or extra-axial collection. No evidence of acute cortical infarct. Normal appearance of the brain parenchyma and extra axial spaces for age. Vascular: No hyperdense vessel or unexpected vascular calcification. Skull: Normal visualized skull base, calvarium and extracranial soft tissues. Sinuses/Orbits: No sinus fluid levels or advanced mucosal thickening. No mastoid effusion. Normal orbits. IMPRESSION: Normal head CT. Electronically Signed   By: Ulyses Jarred M.D.   On: 03/15/2018 00:15   Dg Esophagus  Result Date: 03/20/2018 CLINICAL DATA:  Dysphagia.  Pain and food sticking in  mid chest EXAM: ESOPHOGRAM/BARIUM SWALLOW TECHNIQUE: Single contrast examination was performed using thin barium. A 13 mm barium tablet was also given. FLUOROSCOPY TIME:  Fluoroscopy Time:  1 minutes 18 seconds Radiation Exposure Index (if provided by the fluoroscopic device): Number of Acquired Spot Images: 0 COMPARISON:  Chest CT 07/18/2016 FINDINGS: There is a small hiatal hernia. Disruption of all primary esophageal peristaltic waves with stasis. There is irregular luminal narrowing within the distal esophagus just above the hernia. The 13 mm barium tablet sticks just above this area of narrowing, thin also sticks in the distal esophagus at this area of irregular narrowing. This reproduces the patient's severe pain  symptoms. IMPRESSION: Esophageal dysmotility. Irregular stricture in the distal esophagus just above a small hiatal hernia. The 13 mm barium tablet sticks several cm proximal to this area, then again at this area of distal esophageal narrowing. Cannot exclude esophageal neoplasm. Recommend further evaluation with endoscopy. Electronically Signed   By: Rolm Baptise M.D.   On: 03/20/2018 14:08   US Renal  Result Date: 03/16/2018 CLINICAL DATA:  Acute renal injury EXAM: RENAL / URINARY TRACT ULTRASOUND COMPLETE COMPARISON:  None. FINDINGS: Right Kidney: Length: 11.0 cm. Echogenicity and renal cortical thickness are within normal limits. No mass or perinephric fluid visualized. There is slight hydronephrosis on the right. No sonographically demonstrable calculus or ureterectasis. Left Kidney: Length: 10.0 cm. Echogenicity and renal cortical thickness are within normal limits. No mass or perinephric fluid visualized. There is slight hydronephrosis on the left. No sonographically demonstrable calculus or ureterectasis. Bladder: Appears normal for degree of bladder distention. Foley catheter in place. There remains significant urine in the bladder despite presence of Foley catheter. IMPRESSION: Foley catheter in place. There is significant urine within the urinary bladder in spite of the Foley catheter present. There is slight fullness of each renal collecting system which potentially could be due to relative distention of the urinary bladder. No obstructing foci identified on either side. Study otherwise unremarkable. Electronically Signed   By: Lowella Grip III M.D.   On: 03/16/2018 09:35   Dg Chest Portable 1 View  Result Date: 03/14/2018 CLINICAL DATA:  Abdominal pain. EXAM: PORTABLE CHEST 1 VIEW COMPARISON:  Chest x-ray dated November 21, 2017. FINDINGS: Stable cardiomegaly. Normal pulmonary vascularity. No focal consolidation, pleural effusion, or pneumothorax. No acute osseous abnormality. IMPRESSION: No  active disease. Electronically Signed   By: Titus Dubin M.D.   On: 03/14/2018 21:46   [place] Ekg Site Rite  Result Date: 03/16/2018 If Site Rite image not attached, placement could not be confirmed due to current cardiac rhythm.      LAB RESULTS: Basic Metabolic Panel: Recent Labs  Lab 03/20/18 0530 03/21/18 0800 03/22/18 0300  NA 143 146* 140  K 3.4* 4.0 3.9  CL 109 115* 108  CO2 _0 GLUCOSE 145* 127* 325*  BUN 30* 22* 22*  CREATININE 1.75* 1.61* 1.62*  CALCIUM 8.1* 8.5* 8.2*  MG 2.3  --   --   PHOS 2.2*  --   --    Liver Function Tests: Recent Labs  Lab 03/21/18 0800 03/22/18 0300  AST 33 23  ALT 25 23  ALKPHOS 108 106  BILITOT 0.2* 0.4  PROT 5.6* 5.4*  ALBUMIN 2.2* 2.0*   Recent Labs  Lab 03/17/18 2331  LIPASE 71*   No results for input(s): AMMONIA in the last 168 hours. CBC: Recent Labs  Lab 03/21/18 0800 03/22/18 0300  WBC 9.9 6.5  HGB 11.6* 10.9*  HCT 37.1* 35.4*  MCV 88.5 89.2  PLT 126* 140*   Cardiac Enzymes: No results for input(s): CKTOTAL, CKMB, CKMBINDEX, TROPONINI in the last 168 hours. BNP: Invalid input(s): POCBNP CBG: Recent Labs  Lab 03/22/18 0803 03/22/18 1219  GLUCAP 242* 196*      Disposition and Follow-up: Discharge Instructions    Diet Carb Modified   Complete by:  As directed    Discharge instructions   Complete by:  As directed    Please STOP hydrochlorothiazide. HOLD imdur, hydralazine and amlodipine. Check your BP daily. If running consistently elevated above 130-140's/ 90's, restart imdur. You can add your BP medications one by one if your BP continues to be elevated.    It is VERY IMPORTANT that you follow up with a PCP on a regular basis.  Check your blood glucoses before each meal and at bedtime and maintain a log of your readings.  Bring this log with you when you follow up with your PCP so that he or she can adjust your insulin at your follow up visit.   Discharge instructions   Complete by:  As  directed    Please finish your own insulin as per instructions, then fill the RELI-ON Novolin 70/30 prescription   Increase activity slowly   Complete by:  As directed        [place] Follow up.   Why:  Additonal option for medication assistance and primary care doctor Contact information: [place] [address] [place] Follow up on 04/19/2018.   Why:  Appt with doctor 5/8 at 11am. Also, go to clinic on 4/18 at 8:30 to apply for orange card (this will provide assistance for both meds and copays). First appt will require copay of $20, if you fail to secure orange card future visits will cost  $60.   Contact information: [employer] [address] [place] Follow up.   Specialty:  Internal Medicine Why:  Additonal option for medication assistance and primary care doctor Contact information: [place] [phone]       Jacelyn Pi, MD. Schedule an appointment as soon as possible for a visit in 2 week(s).   Specialty:  Endocrinology Contact information: [address] [place] [address] [phone]        [name], MD. Schedule an appointment as soon as possible for a visit in 2 week(s).   Specialty:  Gastroenterology Contact information: [address] [place] [address] [phone]            Time coordinating discharge:  45 minutes  Signed:   Estill Cotta M.D. [facility] 03/22/2018, 1:19 PM Pager: [phone]

## 2018-03-22 NOTE — Progress Notes (Signed)
Inpatient Diabetes Program Recommendations  AACE/ADA: New Consensus Statement on Inpatient Glycemic Control (2015)  Target Ranges:  Prepandial:   less than 140 mg/dL      Peak postprandial:   less than 180 mg/dL (1-2 hours)      Critically ill patients:  140 - 180 mg/dL   Lab Results  Component Value Date   GLUCAP 242 (H) 03/22/2018   HGBA1C >15.5 (H) 03/15/2018    Review of Glycemic Control Results for Joshua [name], Joshua [name] (MRN [id number]) as of 03/22/2018 10:57  Ref. Range 03/21/2018 07:58 03/21/2018 11:37 03/21/2018 16:48 03/21/2018 21:41 03/22/2018 08:03  Glucose-Capillary Latest Ref Range: 65 - 99 mg/dL 113 (H) 137 (H) 152 (H) 324 (H) 242 (H)   Diabetes history: DM2 Outpatient Diabetes medications: Novolin NPH 22 units bid + Novolin R 10-20 units tid (hadn't taken taken insulin for approx 2 1/2 weeks) Current orders for Inpatient glycemic control: Lantus 24 units bid + Novolog 13 units meal coverage tid + Novolog correction tid + hs  Inpatient Diabetes Program Recommendations:   Spoke with patient @ bedside. Patient shared he has not taken his insulin for approx. 2 1/2 weeks after he lost his job. Has seen Dr. Chalmers Cater as his endocrinologist in the past. On discharge, consider Relion Novolin Insulin 70/30 insulin mix 34 units bid = approx. 47.6 basal + 20.4 meal coverage.  Spoke with pt about A1C results with them and explained what an A1C is, basic pathophysiology of DM Type 2, basic home care, basic diabetes diet nutrition principles, importance of checking CBGs and maintaining good CBG control to prevent long-term and short-term complications. Reviewed signs and symptoms of hyperglycemia and hypoglycemia and how to treat hypoglycemia at home. Also reviewed blood sugar goals at home.  RNs to provide ongoing basic DM education at bedside with this patient.  Patient has a glucometer but out of strips. Patient plans to get strips from [facility] for his meter and verbalizes he knows importance of taking  his insulin.  Thank you, Nani Gasser. Eliceo Gladu, RN, MSN, CDE  Diabetes Coordinator Inpatient Glycemic Control Team Team Pager [phone] (8am-5pm) 03/22/2018 11:06 AM

## 2018-03-22 NOTE — Plan of Care (Signed)
Pt. Adequate for discharge. Instructions to continue viral of insulin at home before switched to 70/30 was explained and pt. Verbalized understanding and was able to teach about instructions. All paperwork given pt.

## 2018-03-22 NOTE — Anesthesia Postprocedure Evaluation (Signed)
Anesthesia Post Note  Patient: Joshua [name]  Procedure(s) Performed: ESOPHAGOGASTRODUODENOSCOPY (EGD) WITH PROPOFOL (N/A ) BALLOON DILATION (N/A )     Patient location during evaluation: Endoscopy Anesthesia Type: MAC Level of consciousness: awake and alert Pain management: pain level controlled Vital Signs Assessment: post-procedure vital signs reviewed and stable Respiratory status: spontaneous breathing, nonlabored ventilation, respiratory function stable and patient connected to nasal cannula oxygen Cardiovascular status: blood pressure returned to baseline and stable Postop Assessment: no apparent nausea or vomiting Anesthetic complications: no    Last Vitals:  Vitals:   03/22/18 0316 03/22/18 0802  BP: (!) 149/85 112/78  Pulse: 90   Resp: 17 (!) 22  Temp: 36.7 C 36.8 C  SpO2: 98%     Last Pain:  Vitals:   03/22/18 0802  TempSrc: Oral  PainSc:                  [place] S

## 2018-03-23 ENCOUNTER — Encounter (HOSPITAL_COMMUNITY): Payer: Self-pay | Admitting: Gastroenterology

## 2018-03-23 MED FILL — NOVOLIN 70/30 100 UNITS/ML: (70-30) 100 | 14 days supply | Qty: 10 | Fill #0

## 2018-03-23 MED FILL — NovoLIN R 100 UNIT/ML SOLN: 100 | 22 days supply | Qty: 10 | Fill #0

## 2018-03-23 MED FILL — METOCLOPRAMIDE 10 MG TABLET: 10 | 10 days supply | Qty: 30 | Fill #0 | Status: TO

## 2018-03-23 MED FILL — PANTOPRAZOLE SOD DR 40 MG T: 40 | 30 days supply | Qty: 60 | Fill #0 | Status: TO

## 2018-04-19 ENCOUNTER — Ambulatory Visit: Payer: Self-pay | Admitting: Internal Medicine

## 2018-04-25 ENCOUNTER — Ambulatory Visit: Payer: Self-pay | Admitting: Internal Medicine

## 2018-05-06 ENCOUNTER — Other Ambulatory Visit: Payer: Self-pay | Admitting: Cardiovascular Disease

## 2018-05-09 ENCOUNTER — Other Ambulatory Visit: Payer: Self-pay

## 2018-05-09 MED ORDER — TICAGRELOR 90 MG PO TABS
90.0000 mg | ORAL_TABLET | Freq: Two times a day (BID) | ORAL | 7 refills | Status: DC
Start: 2018-05-09 — End: 2019-01-12

## 2018-05-09 NOTE — Telephone Encounter (Signed)
Rx(s) sent to pharmacy electronically.  

## 2018-05-09 NOTE — Telephone Encounter (Signed)
Rx sent to pharmacy   

## 2018-05-10 ENCOUNTER — Telehealth: Payer: Self-pay | Admitting: Cardiovascular Disease

## 2018-05-10 NOTE — Telephone Encounter (Signed)
Pt's wife calling stating that pt is having trouble getting his medication of Brilinta and would like to know if the pt could get some samples. Pt's wife would like for the nurse to give her a call back. Please address

## 2018-05-11 NOTE — Telephone Encounter (Signed)
Attempt to return call to wife-unavailable and unable to leave VM.  Attempt to call patient, unable to reach or leave VM as there is calling restrictions.

## 2018-05-12 ENCOUNTER — Telehealth: Payer: Self-pay | Admitting: Cardiovascular Disease

## 2018-05-12 NOTE — Telephone Encounter (Signed)
Calling concerning pt's message from yesterday about his mediation.    This is the message from yesterday. I copy and paste message ......  Pt's wife calling stating that pt is having trouble getting his medication of Brilinta and would like to know if the pt could get some samples. Pt's wife would like for the nurse to give her a call back. Please address

## 2018-05-12 NOTE — Telephone Encounter (Signed)
Samples at the front desk, pt notified

## 2018-05-12 NOTE — Telephone Encounter (Signed)
See other telephone message

## 2018-05-25 ENCOUNTER — Ambulatory Visit: Payer: Self-pay | Admitting: Physical Medicine & Rehabilitation

## 2018-09-04 ENCOUNTER — Telehealth: Payer: Self-pay | Admitting: *Deleted

## 2018-09-04 MED ORDER — ROSUVASTATIN CALCIUM 40 MG PO TABS: 40.0000 mg | ORAL_TABLET | Freq: Every day | ORAL | 0 refills | Status: DC

## 2018-09-04 MED ORDER — METOPROLOL SUCCINATE ER 100 MG PO TB24: ORAL_TABLET | ORAL | 0 refills | Status: DC

## 2018-09-04 NOTE — Telephone Encounter (Signed)
Patient left a msg on the refill vm requesting refills on allopurinol, metoprolol, rosuvastatin and pantoprazole. Call back number provided was [phone]. No further information was left. Thanks, MI

## 2018-10-06 ENCOUNTER — Other Ambulatory Visit: Payer: Self-pay | Admitting: Cardiovascular Disease

## 2018-11-29 ENCOUNTER — Ambulatory Visit: Payer: Medicaid Other | Admitting: Physician Assistant

## 2018-11-29 ENCOUNTER — Encounter: Payer: Self-pay | Admitting: Physician Assistant

## 2018-11-29 VITALS — BP 150/92 | HR 68 | Ht 64.0 in | Wt 306.2 lb

## 2018-11-29 DIAGNOSIS — N183 Chronic kidney disease, stage 3 unspecified: Secondary | ICD-10-CM

## 2018-11-29 DIAGNOSIS — E119 Type 2 diabetes mellitus without complications: Secondary | ICD-10-CM | POA: Diagnosis not present

## 2018-11-29 DIAGNOSIS — I25119 Atherosclerotic heart disease of native coronary artery with unspecified angina pectoris: Secondary | ICD-10-CM

## 2018-11-29 DIAGNOSIS — Z9989 Dependence on other enabling machines and devices: Secondary | ICD-10-CM

## 2018-11-29 DIAGNOSIS — Z01812 Encounter for preprocedural laboratory examination: Secondary | ICD-10-CM | POA: Diagnosis not present

## 2018-11-29 DIAGNOSIS — Z794 Long term (current) use of insulin: Secondary | ICD-10-CM

## 2018-11-29 DIAGNOSIS — R079 Chest pain, unspecified: Secondary | ICD-10-CM

## 2018-11-29 DIAGNOSIS — I10 Essential (primary) hypertension: Secondary | ICD-10-CM

## 2018-11-29 DIAGNOSIS — G4733 Obstructive sleep apnea (adult) (pediatric): Secondary | ICD-10-CM

## 2018-11-29 DIAGNOSIS — E785 Hyperlipidemia, unspecified: Secondary | ICD-10-CM

## 2018-11-29 NOTE — Progress Notes (Signed)
Cardiology Office Note    Date:  12/01/2018   ID:  Joshua [name], DOB [birth year], MRN [id number]  PCP:  [name], MD  Cardiologist: Dr. Claiborne Billings  Chief Complaint  Patient presents with  . Follow-up    seen for Dr. Claiborne Billings.     History of Present Illness:  Joshua [name] is a 62 y.o. male with PMH of CAD, CKD stage III, DM 2 with history of DKA, hypertension, hyperlipidemia, obstructive sleep apnea on CPAP and history of pulmonary sarcoidosis.  He had anterior lateral STEMI in November 2012 requiring 3.0 x 32 mm Promus DES placed in the LAD, he subsequently had staged PCI of mid RCA with another drug-eluting stent.  He had another cardiac catheterization in August 2016, LAD stent was patent, there was 40% D1 lesion, 60% lesion in left circumflex artery, 50% in-stent restenosis in the RCA.  Echocardiogram obtained at the time showed EF 45 to 50% with moderate LVH, grade 1 DD.  He was placed on Protonix for management of GERD.  He was readmitted in August 2017 with NSTEMI, repeat cardiac catheterization showed new proximal LAD lesion and 95% OM1 lesion.  He underwent another stenting of LAD and left circumflex vessel with synergy stents.  He was readmitted in October 2017 with atypical chest pain.  Myoview at the time showed EF 39% without ischemia.  Patient was last seen by Dr. Claiborne Billings in December 2018, at which time he denies any further anginal symptoms.  Since the last office visit, patient was admitted with DKA and severe dehydration in the setting of uncontrolled type II DM in April 2019.  On arrival his creatinine was 5.25, CBG of 1241, anion gap was over 35.  Hemoglobin A1c was greater than 15.5.  On further discussion, he apparently lost his job and was not taking the insulin for more than 2-1/2 weeks.  He presents today for evaluation of exertional chest discomfort in the past 2 months.  He also occurs at rest but more so with exertion.  This is similar to what he has experienced in  the past.  He says he was off of all of his medication for about a month and a half as he could not afford them, however since he got Medicaid 2 months ago he has been taking off his medication.  Despite so, his exertional chest discomfort has been intermittent.  The last episode was a week ago which occurred while he was walking up a hill to catch a bus.  I discussed his case with Dr. Claiborne Billings, given the exertional component of his symptom, we recommend cardiac catheterization for definitive evaluation.  Since he has baseline kidney disease, we plan to bring the patient to the hospital 4 hours in advance to hydrate him aggressively.   Past Medical History:  Diagnosis Date  . Anemia   . Chronic combined systolic and diastolic CHF (congestive heart failure) ([place])    a. 07/2015 Echo: EF 45-50%, mod LVH, mid apical/antsept AK, Gr 1 DD.  [name] CKD (chronic kidney disease), stage III ([place])   . Coronary artery disease    a. 2012 s/p MI/PCI: s/p DES to mid/dist RCA and overlapping DES to LAD;  b. 07/2015 Cath: LAD 10% ISR, D1 40(jailed), LCX 24m OM2 30, OM3 30, RCA 56mSR, 10d ISR; c. 07/2016 NSTEMI/PCI: LAD 85ost/p/m (3.5x20 Synergy DES overlapping prior stent), D1 40, LCX 7543mM2 95 (2.75x16 Synergy DES), OM3 30, RCA 60m22md.  . Depression   .  Diabetic gastroparesis ([place])   . DKA (diabetic ketoacidoses) ([place]) 03/14/2018  . GERD (gastroesophageal reflux disease)   . Gout   . Gout   . Heart murmur   . Hyperlipemia   . Hypertensive heart disease   . Ischemic cardiomyopathy    a. 07/2015 Echo: EF 45-50%, mod LVH, mid-apicalanteroseptal AK, Gr1 DD.  . Morbid obesity ([place])   . Myocardial infarction ([place]) 2012  . OSA on CPAP   . Osteomyelitis ([place])    a. 07/2016 MTP joint of R great toe s/p transmetatarsal amputation.  . Polyneuropathy in diabetes(357.2)   . Pulmonary sarcoidosis ([place])    "problems w/it years ago; no problems anymore" (07/03/2014)  . Rectal bleeding   . Type II diabetes mellitus ([place])      Past Surgical History:  Procedure Laterality Date  . AMPUTATION Right 07/24/2016   Procedure: TRANSMETATARSAL FOOT AMPUTATION;  Surgeon: [name], MD;  Location: [place];  Service: Orthopedics;  Laterality: Right;  . BALLOON DILATION N/A 03/21/2018   Procedure: BALLOON DILATION;  Surgeon: [name], MD;  Location: [facility] ENDOSCOPY;  Service: Endoscopy;  Laterality: N/A;  . BREAST LUMPECTOMY Right ~ 2000   "benign"  . CARDIAC CATHETERIZATION N/A 07/30/2015   Procedure: Left Heart Cath and Coronary Angiography;  Surgeon: Burnell Blanks, MD;  Location: [place] CV LAB;  Service: Cardiovascular;  Laterality: N/A;  . CARDIAC CATHETERIZATION N/A 07/19/2016   Procedure: Left Heart Cath and Coronary Angiography;  Surgeon: Belva Crome, MD;  Location: [place] CV LAB;  Service: Cardiovascular;  Laterality: N/A;  . CARDIAC CATHETERIZATION N/A 07/29/2016   Procedure: Coronary Stent Intervention;  Surgeon: Belva Crome, MD;  Location: [place] CV LAB;  Service: Cardiovascular;  Laterality: N/A;  . CARDIAC CATHETERIZATION N/A 07/29/2016   Procedure: Intravascular Ultrasound/IVUS;  Surgeon: Belva Crome, MD;  Location: [place] CV LAB;  Service: Cardiovascular;  Laterality: N/A;  . CATARACT EXTRACTION W/ INTRAOCULAR LENS  IMPLANT, BILATERAL Bilateral 2014-2015  . COLONOSCOPY WITH PROPOFOL N/A 08/22/2017   Procedure: COLONOSCOPY WITH PROPOFOL;  Surgeon: [name], MD;  Location: [place];  Service: Endoscopy;  Laterality: N/A;  . CORONARY ANGIOPLASTY WITH STENT PLACEMENT  10/31/2011   30 % MID ATRIOVENTRICULAR GROOVE CX STENOSIS. 90 % MID RCA.PTA TO LAD/STENTING OF TANDEM 60-70%. LAD STENOSIS 99% REDUCED TO 0%.3.0 X 32MM PROMUS DES POSTDILATED TO 3.25MM.  [name] CORONARY ANGIOPLASTY WITH STENT PLACEMENT  07/03/2014   "2"  . ESOPHAGOGASTRODUODENOSCOPY (EGD) WITH PROPOFOL N/A 03/21/2018   Procedure: ESOPHAGOGASTRODUODENOSCOPY (EGD) WITH PROPOFOL;  Surgeon: [name], MD;   Location: [place];  Service: Endoscopy;  Laterality: N/A;  . I&D EXTREMITY Right 10/30/2013   Procedure: IRRIGATION AND DEBRIDEMENT RIGHT SMALL FINGER POSSIBLE SECONDARY WOUND CLOSURE;  Surgeon: [name], MD;  Location: [facility];  Service: Orthopedics;  Laterality: Right;  [name] is available after 4pm  . I&D EXTREMITY Right 11/01/2013   Procedure:  IRRIGATION AND DEBRIDEMENT RIGHT  PROXIMAL PHALANGEAL LEVEL AMPUTATION;  Surgeon: [name], MD;  Location: [facility];  Service: Orthopedics;  Laterality: Right;  . INCISION AND DRAINAGE Right 10/28/2013   Procedure: INCISION AND DRAINAGE Right small finger;  Surgeon: [name], MD;  Location: [facility];  Service: Orthopedics;  Laterality: Right;  . LEFT HEART CATH Left 11/03/2011   Procedure: LEFT HEART CATH;  Surgeon: [name], MD;  Location: [facility] CATH LAB;  Service: Cardiovascular;  Laterality: Left;  . LEFT HEART CATHETERIZATION WITH CORONARY ANGIOGRAM N/A 07/03/2014  Procedure: LEFT HEART CATHETERIZATION WITH CORONARY ANGIOGRAM;  Surgeon: [name], MD;  Location: [facility] CATH LAB;  Service: Cardiovascular;  Laterality: N/A;  . PERCUTANEOUS CORONARY STENT INTERVENTION (PCI-S) Left 11/03/2011   Procedure: PERCUTANEOUS CORONARY STENT INTERVENTION (PCI-S);  Surgeon: [name], MD;  Location: [facility] CATH LAB;  Service: Cardiovascular;  Laterality: Left;  . TOE AMPUTATION Right ~ 2010   "2nd toe"  . TOE AMPUTATION Right 2012   "3rd toe"    Current Medications: Outpatient Medications Prior to Visit  Medication Sig Dispense Refill  . allopurinol (ZYLOPRIM) 300 MG tablet Take 300 mg by mouth daily.     [name] amLODipine (NORVASC) 10 MG tablet Take 1 tablet (10 mg total) by mouth daily. HOLD until follow -up with your doctor (see instructions) 90 tablet 1  . aspirin EC 81 MG tablet Take 81 mg by mouth daily.     . blood glucose meter kit and supplies KIT Dispense based on patient and insurance preference. Use up to four  times daily as directed. (FOR ICD-9 250.00, 250.01).  RELION glucometer 1 each 0  . furosemide (LASIX) 40 MG tablet Take 40 mg by mouth daily.    . hydrALAZINE (APRESOLINE) 50 MG tablet Take 1 tablet (50 mg total) by mouth every 8 (eight) hours. HOLD until follow -up with your doctor (see instructions) (Patient taking differently: Take 50 mg by mouth every 8 (eight) hours. )  11  . insulin NPH Human (HUMULIN N,NOVOLIN N) 100 UNIT/ML injection Inject 0.22 mLs (22 Units total) into the skin 2 (two) times daily before a meal. Please finish this insulin vial at home (Patient not taking: Reported on 11/30/2018) 10 mL 11  . insulin NPH-regular Human (NOVOLIN 70/30 RELION) (70-30) 100 UNIT/ML injection Inject 34 Units into the skin 2 (two) times daily with a meal. Please fill this prescription only when you have finished your insulin at home (Patient taking differently: Inject 22-34 Units into the skin See admin instructions. Inject 34 units twice daily unless blood sugar is under 100 inject only 22 units) 10 mL 11  . insulin regular (NOVOLIN R,HUMULIN R) 100 units/mL injection Sliding scale  CBG 70 - 120: 0 units: CBG 121 - 150: 2 units; CBG 151 - 200: 3 units; CBG 201 - 250: 5 units; CBG 251 - 300: 8 units;CBG 301 - 350: 11 units; CBG 351 - 400: 15 units; CBG > 400 : 15 units and notify your doctor  Please finish the insulin vial at home (Patient taking differently: Inject 2-15 Units into the skin 3 (three) times daily with meals. Sliding scale  CBG 70 - 120: 0 units: CBG 121 - 150: 2 units; CBG 151 - 200: 3 units; CBG 201 - 250: 5 units; CBG 251 - 300: 8 units;CBG 301 - 350: 11 units; CBG 351 - 400: 15 units; CBG > 400 : 15 units and notify your doctor  Please finish the insulin vial at home) 10 mL 11  . isosorbide mononitrate (IMDUR) 120 MG 24 hr tablet Take 1 tablet (120 mg total) by mouth daily. HOLD until follow -up with your doctor (see instructions) (Patient taking differently: Take 120 mg by mouth  daily. ) 90 tablet 2  . metoCLOPramide (REGLAN) 10 MG tablet Take 1 tablet (10 mg total) by mouth every 8 (eight) hours as needed for nausea. 30 tablet 1  . metoprolol succinate (TOPROL XL) 100 MG 24 hr tablet Take 1 & 1/2  Tablets daily (Patient taking differently:  Take 150 mg by mouth daily. ) 30 tablet 0  . nitroGLYCERIN (NITROSTAT) 0.4 MG SL tablet Place 1 tablet (0.4 mg total) under the tongue every 5 (five) minutes as needed for chest pain. 25 tablet 3  . pantoprazole (PROTONIX) 40 MG tablet Take 1 tablet (40 mg total) by mouth 2 (two) times daily before a meal. 60 tablet 3  . rosuvastatin (CRESTOR) 40 MG tablet Take 1 tablet (40 mg total) by mouth daily. Please call to make appointment 15 tablet 0  . ticagrelor (BRILINTA) 90 MG TABS tablet Take 1 tablet (90 mg total) by mouth 2 (two) times daily. 60 tablet 7  . acetaminophen (TYLENOL) 325 MG tablet Take 2 tablets (650 mg total) by mouth every 4 (four) hours as needed for headache or mild pain.     No facility-administered medications prior to visit.      Allergies:   Chlorthalidone   Social History   Socioeconomic History  . Marital status: Married    Spouse name: Not on file  . Number of children: Not on file  . Years of education: Not on file  . Highest education level: Not on file  Occupational History  . Not on file  Social Needs  . Financial resource strain: Not on file  . Food insecurity:    Worry: Not on file    Inability: Not on file  . Transportation needs:    Medical: Not on file    Non-medical: Not on file  Tobacco Use  . Smoking status: Never Smoker  . Smokeless tobacco: Never Used  Substance and Sexual Activity  . Alcohol use: Yes    Comment: social   . Drug use: No  . Sexual activity: Yes  Lifestyle  . Physical activity:    Days per week: Not on file    Minutes per session: Not on file  . Stress: Not on file  Relationships  . Social connections:    Talks on phone: Not on file    Gets together: Not  on file    Attends religious service: Not on file    Active member of club or organization: Not on file    Attends meetings of clubs or organizations: Not on file    Relationship status: Not on file  Other Topics Concern  . Not on file  Social History Narrative  . Not on file     Family History:  The patient's family history includes Diabetes in his maternal grandmother; Heart attack in his maternal aunt; Hypertension in his maternal grandmother.   ROS:   Please see the history of present illness.    ROS All other systems reviewed and are negative.   PHYSICAL EXAM:   VS:  BP (!) 150/92   Pulse 68   Ht '5\' 4"'  (1.626 m)   Wt (!) 306 lb 3.2 oz (138.9 kg)   SpO2 97%   BMI 52.56 kg/m    GEN: Well nourished, well developed, in no acute distress  HEENT: normal  Neck: no JVD, carotid bruits, or masses Cardiac: RRR; no murmurs, rubs, or gallops,no edema  Respiratory:  clear to auscultation bilaterally, normal work of breathing GI: soft, nontender, nondistended, + BS MS: no deformity or atrophy  Skin: warm and dry, no rash Neuro:  Alert and Oriented x 3, Strength and sensation are intact Psych: euthymic mood, full affect  Wt Readings from Last 3 Encounters:  11/29/18 (!) 306 lb 3.2 oz (138.9 kg)  03/22/18 251 lb 12.3  oz (114.2 kg)  11/29/17 289 lb 3.2 oz (131.2 kg)      Studies/Labs Reviewed:   EKG:  EKG is ordered today.  The ekg ordered today demonstrates NSR without significant ST-T wave changes  Recent Labs: 03/16/2018: TSH 0.608 03/20/2018: Magnesium 2.3 03/22/2018: ALT 23 11/29/2018: BUN 23; Creatinine, Ser 2.13; Hemoglobin 13.4; Platelets 264; Potassium 3.8; Sodium 143   Lipid Panel    Component Value Date/Time   CHOL 158 11/17/2016 0921   TRIG 116 11/17/2016 0921   HDL 74 11/17/2016 0921   CHOLHDL 2.1 11/17/2016 0921   VLDL 23 11/17/2016 0921   LDLCALC 61 11/17/2016 0921    Additional studies/ records that were reviewed today include:   Cath 07/29/2016  Mid  RCA lesion, 50 %stenosed.  Mid RCA to Dist RCA lesion, 10 %stenosed.  RPDA lesion, 30 %stenosed.  Mid Cx lesion, 75 %stenosed.  3rd Mrg lesion, 30 %stenosed.  Ost 1st Diag to 1st Diag lesion, 40 %stenosed.  Dist LAD lesion, 10 %stenosed.  Ost LAD to Prox LAD lesion, 85 %stenosed.  A STENT SYNERGY DES 3.5X20 drug eluting stent was successfully placed, and overlaps previously placed stent.  Prox LAD to Mid LAD lesion, 85 %stenosed.  Post intervention, there is a 0% residual stenosis.  A STENT SYNERGY DES [id number] drug eluting stent was successfully placed, and does not overlap previously placed stent.  2nd Mrg lesion, 95 %stenosed.  Post intervention, there is a 0% residual stenosis.    Successful stenting of proximal 85% LAD stenosis with a Synergy 3.5 x 20 drug-eluting stent from the LAD ostium to the proximal vessel overlapping previously placed stents. A 0% stenosis is noted post implantation. Final postdilatation diameter of 3.75 mm.  Successful stenting of the second obtuse marginal from 90% to 0% using a Synergy 2.75 x 16 mm DES deployed at 14 atm. Postdilatation was not performed.  Total contrast 95 cc  RECOMMENDATIONS:  IV fluids at 75 cc by our for 18 hours. Basic metabolic panel in a.m.    ASSESSMENT:    1. Chest pain, unspecified type   2. Pre-procedure lab exam   3. Coronary artery disease involving native coronary artery of native heart with angina pectoris ([place])   4. Controlled type 2 diabetes mellitus without complication, with long-term current use of insulin ([place])   5. CKD (chronic kidney disease), stage III ([place])   6. Essential hypertension   7. Hyperlipidemia LDL goal <70   8. OSA on CPAP      PLAN:  In order of problems listed above:  1. CAD: Progressive chest pain for the past 2 months both at rest and with exertion.  Chest pain similar to the previous angina.  I discussed the case with Dr. Claiborne Billings, we recommend proceed with cardiac  catheterization.  Will obtain standard lab work to check CBC, basic metabolic panel.  Note, patient was off of all his medications for roughly a month and a half due to cost, however since he was able to qualify for Medicaid 2 months ago he has restarted all of his cardiac medication.  Continue on aspirin and Brilinta  2. Hypertension: Blood pressure mildly elevated today.  3. Hyperlipidemia: On Crestor 40 mg daily, will need repeat lab work later since he was off of all his medications previously and was recently restarted on them 2 months ago  4. DM2: On insulin, managed by primary care provider.  Had episode of DKA earlier this year after he came off his insulin.  5. CKD: Obtain basic metabolic panel    Medication Adjustments/Labs and Tests Ordered: Current medicines are reviewed at length with the patient today.  Concerns regarding medicines are outlined above.  Medication changes, Labs and Tests ordered today are listed in the Patient Instructions below. Patient Instructions  Medication Instructions:  Your Physician recommend you continue on your current medication as directed.    If you need a refill on your cardiac medications before your next appointment, please call your pharmacy.   Lab work: Your physician recommends that you return for lab work today (CBC, BMP)  If you have labs (blood work) drawn today and your tests are completely normal, you will receive your results only by: [name] MyChart Message (if you have MyChart) OR . A paper copy in the mail If you have any lab test that is abnormal or we need to change your treatment, we will call you to review the results.  Testing/Procedures: Your physician has requested that you have a cardiac catheterization. Cardiac catheterization is used to diagnose and/or treat various heart conditions. Doctors may recommend this procedure for a number of different reasons. The most common reason is to evaluate chest pain. Chest pain can be a  symptom of coronary artery disease (CAD), and cardiac catheterization can show whether plaque is narrowing or blocking your heart's arteries. This procedure is also used to evaluate the valves, as well as measure the blood flow and oxygen levels in different parts of your heart. For further information please visit HugeFiesta.[email or web address]. Please follow instruction sheet, as given. [facility]   Follow-Up: At [facility], you and your health needs are our priority.  As part of our continuing mission to provide you with exceptional heart care, we have created designated Provider Care Teams.  These Care Teams include your primary Cardiologist (physician) and Advanced Practice Providers (APPs -  Physician Assistants and Nurse Practitioners) who all work together to provide you with the care you need, when you need it.  Your physician recommends that you schedule a follow-up appointment in 3 weeks with [name]   Any Other Special Instructions Will Be Listed Below (If Applicable).     [address] [place] [place] [address] Dept: [phone] Loc: [phone]  MANJOT HINKS  11/29/2018  You are scheduled for a Cardiac Catheterization on Friday, December 20 with Dr. [name].  1. Please arrive at the [facility] (Main Entrance A) at [facility]: [address] [place], [place] [address] at 7:00 AM (This time is two hours before your procedure to ensure your preparation). Free valet parking service is available.   Special note: Every effort is made to have your procedure done on time. Please understand that emergencies sometimes delay scheduled procedures.  2. Diet: Do not eat solid foods after midnight.  The patient may have clear liquids until 5am upon the day of the procedure.  3. Labs: You will need to have blood drawn on today (CBC, BMP) 4. Medication instructions in  preparation for your procedure:   Contrast Allergy: No  Hold Insulin morning of procedure   On the morning of your procedure, take your Brilinta/Ticagrelor and any morning medicines NOT listed above.  You may use sips of water.  5. Plan for one night stay--bring personal belongings. 6. Bring a current list of your medications and current insurance cards. 7. You MUST have a responsible person to drive you home. 8.  Someone MUST be with you the first 24 hours after you arrive home or your discharge will be delayed. 9. Please wear clothes that are easy to get on and off and wear slip-on shoes.  Thank you for allowing [place] to care for you!   -- [facility] Invasive Cardiovascular services        Signed, Almyra Deforest, [place]  12/01/2018 11:21 PM    [address], [place], [place]  [address] Phone: [phone]; Fax: [phone]

## 2018-11-29 NOTE — Patient Instructions (Addendum)
Medication Instructions:  Your Physician recommend you continue on your current medication as directed.    If you need a refill on your cardiac medications before your next appointment, please call your pharmacy.   Lab work: Your physician recommends that you return for lab work today (CBC, BMP)  If you have labs (blood work) drawn today and your tests are completely normal, you will receive your results only by: [name] MyChart Message (if you have MyChart) OR . A paper copy in the mail If you have any lab test that is abnormal or we need to change your treatment, we will call you to review the results.  Testing/Procedures: Your physician has requested that you have a cardiac catheterization. Cardiac catheterization is used to diagnose and/or treat various heart conditions. Doctors may recommend this procedure for a number of different reasons. The most common reason is to evaluate chest pain. Chest pain can be a symptom of coronary artery disease (CAD), and cardiac catheterization can show whether plaque is narrowing or blocking your heart's arteries. This procedure is also used to evaluate the valves, as well as measure the blood flow and oxygen levels in different parts of your heart. For further information please visit HugeFiesta.tn. Please follow instruction sheet, as given. [facility]   Follow-Up: At [facility], you and your health needs are our priority.  As part of our continuing mission to provide you with exceptional heart care, we have created designated Provider Care Teams.  These Care Teams include your primary Cardiologist (physician) and Advanced Practice Providers (APPs -  Physician Assistants and Nurse Practitioners) who all work together to provide you with the care you need, when you need it.  Your physician recommends that you schedule a follow-up appointment in 3 weeks with [name]   Any Other Special Instructions Will Be Listed Below (If  Applicable).     [place] [address] Dept: [phone] Loc: [phone]  Joshua [name]  11/29/2018  You are scheduled for a Cardiac Catheterization on Friday, December 20 with Dr. [name].  1. Please arrive at the [facility] (Main Entrance A) at [facility]: [address] [place], [place] [address] at 7:00 AM (This time is two hours before your procedure to ensure your preparation). Free valet parking service is available.   Special note: Every effort is made to have your procedure done on time. Please understand that emergencies sometimes delay scheduled procedures.  2. Diet: Do not eat solid foods after midnight.  The patient may have clear liquids until 5am upon the day of the procedure.  3. Labs: You will need to have blood drawn on today (CBC, BMP) 4. Medication instructions in preparation for your procedure:   Contrast Allergy: No  Hold Insulin morning of procedure   On the morning of your procedure, take your Brilinta/Ticagrelor and any morning medicines NOT listed above.  You may use sips of water.  5. Plan for one night stay--bring personal belongings. 6. Bring a current list of your medications and current insurance cards. 7. You MUST have a responsible person to drive you home. 8. Someone MUST be with you the first 24 hours after you arrive home or your discharge will be delayed. 9. Please wear clothes that are easy to get on and off and wear slip-on shoes.  Thank you for allowing [place] to care for you!   -- [facility]  Invasive Cardiovascular services

## 2018-11-30 ENCOUNTER — Other Ambulatory Visit: Payer: Self-pay | Admitting: Physician Assistant

## 2018-11-30 LAB — BASIC METABOLIC PANEL
BUN/Creatinine Ratio: 11 (ref 10–24)
BUN: 23 mg/dL (ref 8–27)
CO2: 24 mmol/L (ref 20–29)
Calcium: 9.5 mg/dL (ref 8.6–10.2)
Chloride: 105 mmol/L (ref 96–106)
Creatinine, Ser: 2.13 mg/dL — ABNORMAL HIGH (ref 0.76–1.27)
GFR calc Af Amer: 38 mL/min/{1.73_m2} — ABNORMAL LOW (ref >59)
GFR calc non Af Amer: 33 mL/min/{1.73_m2} — ABNORMAL LOW (ref >59)
Glucose: 63 mg/dL — ABNORMAL LOW (ref 65–99)
Potassium: 3.8 mmol/L (ref 3.5–5.2)
Sodium: 143 mmol/L (ref 134–144)

## 2018-11-30 LAB — CBC
Hematocrit: 39.6 % (ref 37.5–51.0)
Hemoglobin: 13.4 g/dL (ref 13.0–17.7)
MCH: 27.6 pg (ref 26.6–33.0)
MCHC: 33.8 g/dL (ref 31.5–35.7)
MCV: 82 fL (ref 79–97)
Platelets: 264 10*3/uL (ref 150–450)
RBC: 4.85 x10E6/uL (ref 4.14–5.80)
RDW: 15 % (ref 12.3–15.4)
WBC: 8.4 10*3/uL (ref 3.4–10.8)

## 2018-12-01 ENCOUNTER — Encounter: Payer: Self-pay | Admitting: Physician Assistant

## 2018-12-01 ENCOUNTER — Other Ambulatory Visit: Payer: Self-pay

## 2018-12-01 ENCOUNTER — Encounter (HOSPITAL_COMMUNITY): Admission: RE | Payer: Self-pay | Source: Home / Self Care

## 2018-12-01 ENCOUNTER — Ambulatory Visit (HOSPITAL_COMMUNITY): Admission: RE | Admit: 2018-12-01 | Payer: Medicaid Other | Source: Home / Self Care | Admitting: Cardiology

## 2018-12-01 DIAGNOSIS — Z01812 Encounter for preprocedural laboratory examination: Secondary | ICD-10-CM

## 2018-12-01 SURGERY — LEFT HEART CATH AND CORONARY ANGIOGRAPHY
Anesthesia: LOCAL

## 2018-12-01 NOTE — Progress Notes (Signed)
I called the patient last night regarding the result and discussed with Dr. Claiborne Billings, due to worsening renal function, his cath is postponed for now. I advised him to stop his lasix, increase hydration, recheck BMET either next Monday or Tue, and if renal function improve, then proceed with cath in later half of next week

## 2018-12-11 ENCOUNTER — Other Ambulatory Visit: Payer: Self-pay | Admitting: *Deleted

## 2018-12-11 ENCOUNTER — Telehealth: Payer: Self-pay | Admitting: Cardiovascular Disease

## 2018-12-11 DIAGNOSIS — Z01812 Encounter for preprocedural laboratory examination: Secondary | ICD-10-CM

## 2018-12-11 NOTE — Telephone Encounter (Signed)
Spoke to pt to inform him that this is a non-cardiac medication and we do nt usually fill meds unless they are Cardiac. Explained to pt that I could send Dr. Claiborne Billings a message to ask if he wanted to fill the med for the pt. Pt stated he had not seen his PCP in a long time, but that he will call them to attempt to have med filled. Pt verbalized thanks for the call.

## 2018-12-11 NOTE — Telephone Encounter (Signed)
New Message    *STAT* If patient is at the pharmacy, call can be transferred to refill team.   1. Which medications need to be refilled? (please list name of each medication and dose if known) allopurinol (ZYLOPRIM) 300 MG tablet  2. Which pharmacy/location (including street and city if local pharmacy) is medication to be sent to? [place], [address]  3. Do they need a 30 day or 90 day supply? 90 day

## 2018-12-12 ENCOUNTER — Telehealth: Payer: Self-pay | Admitting: Physician Assistant

## 2018-12-12 ENCOUNTER — Other Ambulatory Visit: Payer: Self-pay | Admitting: *Deleted

## 2018-12-12 LAB — BASIC METABOLIC PANEL
BUN/Creatinine Ratio: 21 (ref 10–24)
BUN: 44 mg/dL — ABNORMAL HIGH (ref 8–27)
CO2: 19 mmol/L — ABNORMAL LOW (ref 20–29)
Calcium: 9.3 mg/dL (ref 8.6–10.2)
Chloride: 98 mmol/L (ref 96–106)
Creatinine, Ser: 2.11 mg/dL — ABNORMAL HIGH (ref 0.76–1.27)
GFR calc Af Amer: 38 mL/min/{1.73_m2} — ABNORMAL LOW (ref >59)
GFR calc non Af Amer: 33 mL/min/{1.73_m2} — ABNORMAL LOW (ref >59)
Glucose: 561 mg/dL (ref 65–99)
Potassium: 4.7 mmol/L (ref 3.5–5.2)
Sodium: 134 mmol/L (ref 134–144)

## 2018-12-12 MED ORDER — NITROGLYCERIN 0.4 MG SL SUBL: 0.4000 mg | SUBLINGUAL_TABLET | SUBLINGUAL | 1 refills | Status: DC | PRN

## 2018-12-12 NOTE — Telephone Encounter (Signed)
Renal function still the same despite holding diuretic, attempted to call the patient twice which all went to answer machine, will try again on 1/2 to discuss result and plan with the delayed cath

## 2018-12-19 ENCOUNTER — Encounter: Payer: Self-pay | Admitting: Physician Assistant

## 2018-12-22 ENCOUNTER — Encounter: Payer: Self-pay | Admitting: Physician Assistant

## 2018-12-22 ENCOUNTER — Ambulatory Visit (INDEPENDENT_AMBULATORY_CARE_PROVIDER_SITE_OTHER): Payer: Medicaid Other | Admitting: Physician Assistant

## 2018-12-22 ENCOUNTER — Telehealth (HOSPITAL_COMMUNITY): Payer: Self-pay

## 2018-12-22 VITALS — BP 178/102 | HR 77 | Ht 69.0 in | Wt 313.8 lb

## 2018-12-22 DIAGNOSIS — E119 Type 2 diabetes mellitus without complications: Secondary | ICD-10-CM

## 2018-12-22 DIAGNOSIS — Z794 Long term (current) use of insulin: Secondary | ICD-10-CM

## 2018-12-22 DIAGNOSIS — I25119 Atherosclerotic heart disease of native coronary artery with unspecified angina pectoris: Secondary | ICD-10-CM

## 2018-12-22 DIAGNOSIS — R600 Localized edema: Secondary | ICD-10-CM

## 2018-12-22 DIAGNOSIS — E785 Hyperlipidemia, unspecified: Secondary | ICD-10-CM

## 2018-12-22 DIAGNOSIS — Z9989 Dependence on other enabling machines and devices: Secondary | ICD-10-CM

## 2018-12-22 DIAGNOSIS — R072 Precordial pain: Secondary | ICD-10-CM

## 2018-12-22 DIAGNOSIS — N184 Chronic kidney disease, stage 4 (severe): Secondary | ICD-10-CM | POA: Diagnosis not present

## 2018-12-22 DIAGNOSIS — G4733 Obstructive sleep apnea (adult) (pediatric): Secondary | ICD-10-CM

## 2018-12-22 DIAGNOSIS — I10 Essential (primary) hypertension: Secondary | ICD-10-CM

## 2018-12-22 MED ORDER — FUROSEMIDE 40 MG PO TABS: 40.0000 mg | ORAL_TABLET | Freq: Every day | ORAL | 3 refills | Status: DC

## 2018-12-22 MED ORDER — HYDRALAZINE HCL 50 MG PO TABS: 50.0000 mg | ORAL_TABLET | Freq: Three times a day (TID) | ORAL | 3 refills | Status: DC

## 2018-12-22 MED ORDER — ISOSORBIDE MONONITRATE ER 120 MG PO TB24: 120.0000 mg | ORAL_TABLET | Freq: Two times a day (BID) | ORAL | 3 refills | Status: DC

## 2018-12-22 NOTE — Telephone Encounter (Signed)
Encounter complete. 

## 2018-12-22 NOTE — Progress Notes (Signed)
Cardiology Office Note    Date:  12/24/2018   ID:  Joshua [name], DOB July 28, 1956, MRN [id number]  PCP:  [name], MD  Cardiologist:  Dr. Claiborne Billings   Chief Complaint  Patient presents with  . Edema    legs and feet    History of Present Illness:  Joshua [name] is a 63 y.o. male with PMH of CAD, CKD stage III, DM 2 with history of DKA, hypertension, hyperlipidemia, OSA on CPAP and history of pulmonary sarcoidosis.  He had anterior lateral STEMI in November 2012 requiring 3.0 x 32 mm Promus DES placed in the LAD, he subsequently had staged PCI of mid RCA with another drug-eluting stent.  He had another cardiac catheterization in August 2016, LAD stent was patent, there was 40% D1 lesion, 60% lesion in left circumflex artery, 50% in-stent restenosis in the RCA.  Echocardiogram obtained at the time showed EF 45 to 50% with moderate LVH, grade 1 DD.  He was placed on Protonix for management of GERD.  He was readmitted in August 2017 with NSTEMI, repeat cardiac catheterization showed new proximal LAD lesion and 95% OM1 lesion.  He underwent another stenting of LAD and left circumflex vessel with synergy stents.  He was readmitted in October 2017 with atypical chest pain.  Myoview at the time showed EF 39% without ischemia.  Patient was last seen by Dr. Claiborne Billings in December 2018, at which time he denies any further anginal symptoms.  Since the last office visit, patient was admitted with DKA and severe dehydration in the setting of uncontrolled type II DM in April 2019.  On arrival his creatinine was 5.25, CBG of 1241, anion gap was over 35.  Hemoglobin A1c was greater than 15.5.  On further discussion, he apparently lost his job and was not taking the insulin for more than 2-1/2 weeks.  I last saw the patient on 11/29/2018, he was complaining of exertional chest discomfort for over 2 months at the time, the symptom was similar to what he has experienced in the past prior to stent placement.   Therefore we recommended outpatient cardiac catheterization, however his renal function was 10 and the creatinine came back 2.2, therefore cardiac catheterization was delayed.  I stopped his diuretic and repeat the lab work, however creatinine still came back around 2.1.  He presents today for discussion of alternative therapy.   Patient presents today for cardiology office visit, he continued to have occasional chest discomfort and sometimes at rest as well.  Since I stopped his Lasix, he has gained roughly 7 pounds.  I will restart the Lasix for the time being.  Even with rehydration, the likelihood of contrast nephropathy remain very high.  I discussed the case with Dr. Claiborne Billings, we plan to risk stratify this patient using a Lexiscan stress test.  If a large portion of myocardium is at risk, then we will proceed with cardiac catheterization after overnight hydration.  His blood pressure is very high today, apparently he has been taking hydralazine only twice a day instead of scheduled 3 times a day.  I asked him to take the hydralazine 3 times a day starting now.  I will also increase his Imdur to 120 mg twice daily for antianginal purposes.  On physical exam, he has at least 2+ pitting edema, however his lungs is clear.   Past Medical History:  Diagnosis Date  . Anemia   . Chronic combined systolic and diastolic CHF (congestive heart failure) ([place])  a. 07/2015 Echo: EF 45-50%, mod LVH, mid apical/antsept AK, Gr 1 DD.  [name] CKD (chronic kidney disease), stage III ([place])   . Coronary artery disease    a. 2012 s/p MI/PCI: s/p DES to mid/dist RCA and overlapping DES to LAD;  b. 07/2015 Cath: LAD 10% ISR, D1 40(jailed), LCX 46m OM2 30, OM3 30, RCA 528mSR, 10d ISR; c. 07/2016 NSTEMI/PCI: LAD 85ost/p/m (3.5x20 Synergy DES overlapping prior stent), D1 40, LCX 7565mM2 95 (2.75x16 Synergy DES), OM3 30, RCA 27m49md.  . Depression   . Diabetic gastroparesis (HCC)[place]. DKA (diabetic ketoacidoses) (HCC)[place]/01/2018    . GERD (gastroesophageal reflux disease)   . Gout   . Gout   . Heart murmur   . Hyperlipemia   . Hypertensive heart disease   . Ischemic cardiomyopathy    a. 07/2015 Echo: EF 45-50%, mod LVH, mid-apicalanteroseptal AK, Gr1 DD.  . Morbid obesity (HCC)[place]. Myocardial infarction (HCC)[place]  . OSA on CPAP   . Osteomyelitis (HCC)[place] a. 07/2016 MTP joint of R great toe s/p transmetatarsal amputation.  . Polyneuropathy in diabetes(357.2)   . Pulmonary sarcoidosis (HCC)[place] "problems w/it years ago; no problems anymore" (07/03/2014)  . Rectal bleeding   . Type II diabetes mellitus (HCC)[place]  Past Surgical History:  Procedure Laterality Date  . AMPUTATION Right 07/24/2016   Procedure: TRANSMETATARSAL FOOT AMPUTATION;  Surgeon: MarcNewt Minion;  [facility];  Laterality: Right;  . BALLOON DILATION N/A 03/21/2018   Procedure: BALLOON DILATION;  Surgeon: BuccRonald Lobo;  [facility];  Service: Endoscopy;  Laterality: N/A;  . BREAST LUMPECTOMY Right ~ 2000   "benign"  . CARDIAC CATHETERIZATION N/A 07/30/2015   Procedure: Left Heart Cath and Coronary Angiography;  Surgeon: ChriBurnell Blanks;  [facility];  Service: Cardiovascular;  Laterality: N/A;  . CARDIAC CATHETERIZATION N/A 07/19/2016   Procedure: Left Heart Cath and Coronary Angiography;  Surgeon: HenrBelva Crome;  [facility];  Service: Cardiovascular;  Laterality: N/A;  . CARDIAC CATHETERIZATION N/A 07/29/2016   Procedure: Coronary Stent Intervention;  Surgeon: HenrBelva Crome;  [facility];  Service: Cardiovascular;  Laterality: N/A;  . CARDIAC CATHETERIZATION N/A 07/29/2016   Procedure: Intravascular Ultrasound/IVUS;  Surgeon: HenrBelva Crome;  [facility];  Service: Cardiovascular;  Laterality: N/A;  . CATARACT EXTRACTION W/ INTRAOCULAR LENS  IMPLANT, BILATERAL Bilateral 2014-2015  . COLONOSCOPY WITH PROPOFOL N/A  08/22/2017   Procedure: COLONOSCOPY WITH PROPOFOL;  Surgeon: SchoWilford Corner;  [facility];  Laterality: N/A;  . CORONARY ANGIOPLASTY WITH STENT PLACEMENT  10/31/2011   30 % MID ATRIOVENTRICULAR GROOVE CX STENOSIS. 90 % MID RCA.PTA TO LAD/STENTING OF TANDEM 60-70%. LAD STENOSIS 99% REDUCED TO 0%.3.0 X 32MM PROMUS DES POSTDILATED TO 3.25MM.  . COMarland KitchenONARY ANGIOPLASTY WITH STENT PLACEMENT  07/03/2014   "2"  . ESOPHAGOGASTRODUODENOSCOPY (EGD) WITH PROPOFOL N/A 03/21/2018   Procedure: ESOPHAGOGASTRODUODENOSCOPY (EGD) WITH PROPOFOL;  Surgeon: BuccRonald Lobo;  [facility];  Laterality: N/A;  . I&D EXTREMITY Right 10/30/2013   Procedure: IRRIGATION AND DEBRIDEMENT RIGHT SMALL FINGER POSSIBLE SECONDARY WOUND CLOSURE;  Surgeon: MattSchuyler Amor;  Location: [facility];  Service: Orthopedics;  Laterality: Right;  WeinBurney Gauzeavailable after 4pm  . I&D EXTREMITY Right 11/01/2013   Procedure:  IRRIGATION AND DEBRIDEMENT RIGHT  PROXIMAL PHALANGEAL LEVEL AMPUTATION;  Surgeon: [name], MD;  Location: [facility];  Service: Orthopedics;  Laterality: Right;  . INCISION AND DRAINAGE Right 10/28/2013   Procedure: INCISION AND DRAINAGE Right small finger;  Surgeon: [name], MD;  Location: [facility];  Service: Orthopedics;  Laterality: Right;  . LEFT HEART CATH Left 11/03/2011   Procedure: LEFT HEART CATH;  Surgeon: [name], MD;  Location: [facility] CATH LAB;  Service: Cardiovascular;  Laterality: Left;  . LEFT HEART CATHETERIZATION WITH CORONARY ANGIOGRAM N/A 07/03/2014   Procedure: LEFT HEART CATHETERIZATION WITH CORONARY ANGIOGRAM;  Surgeon: [name], MD;  [name];  Service: Cardiovascular;  Laterality: N/A;  . PERCUTANEOUS CORONARY STENT INTERVENTION (PCI-S) Left 11/03/2011   Procedure: PERCUTANEOUS CORONARY STENT INTERVENTION (PCI-S);  Surgeon: [name], MD;  Location: [facility] C[facility] LAB;  Service:  Cardiovascular;  Laterality: Left;  . TOE AMPUTATION Right ~ 2010   "2nd toe"  . TOE AMPUTATION Right 2012   "3rd toe"    Current Medications: Outpatient Medications Prior to Visit  Medication Sig Dispense Refill  . acetaminophen (TYLENOL) 650 MG CR tablet Take 1,300 mg by mouth daily as needed for pain.    [name] allopurinol (ZYLOPRIM) 300 MG tablet Take 300 mg by mouth daily.     [name] amLODipine (NORVASC) 10 MG tablet Take 1 tablet (10 mg total) by mouth daily. HOLD until follow -up with your doctor (see instructions) 90 tablet 1  . aspirin EC 81 MG tablet Take 81 mg by mouth daily.     . blood glucose meter kit and supplies KIT Dispense based on patient and insurance preference. Use up to four times daily as directed. (FOR ICD-9 250.00, 250.01).  RELION glucometer 1 each 0  . Camphor-Menthol-Methyl Sal (HM SALONPAS PAIN RELIEF EX) Apply 1 spray topically daily as needed (pain).    . insulin NPH Human (HUMULIN N,NOVOLIN N) 100 UNIT/ML injection Inject 0.22 mLs (22 Units total) into the skin 2 (two) times daily before a meal. Please finish this insulin vial at home 10 mL 11  . insulin NPH-regular Human (NOVOLIN 70/30 RELION) (70-30) 100 UNIT/ML injection Inject 34 Units into the skin 2 (two) times daily with a meal. Please fill this prescription only when you have finished your insulin at home (Patient taking differently: Inject 22-34 Units into the skin See admin instructions. Inject 34 units twice daily unless blood sugar is under 100 inject only 22 units) 10 mL 11  . insulin regular (NOVOLIN R,HUMULIN R) 100 units/mL injection Sliding scale  CBG 70 - 120: 0 units: CBG 121 - 150: 2 units; CBG 151 - 200: 3 units; CBG 201 - 250: 5 units; CBG 251 - 300: 8 units;CBG 301 - 350: 11 units; CBG 351 - 400: 15 units; CBG > 400 : 15 units and notify your doctor  Please finish the insulin vial at home (Patient taking differently: Inject 2-15 Units into the skin 3 (three) times daily with meals. Sliding scale   CBG 70 - 120: 0 units: CBG 121 - 150: 2 units; CBG 151 - 200: 3 units; CBG 201 - 250: 5 units; CBG 251 - 300: 8 units;CBG 301 - 350: 11 units; CBG 351 - 400: 15 units; CBG > 400 : 15 units and notify your doctor  Please finish the insulin vial at home) 10 mL 11  . metoCLOPramide (REGLAN) 10 MG tablet Take 1 tablet (10 mg total) by  mouth every 8 (eight) hours as needed for nausea. 30 tablet 1  . metoprolol succinate (TOPROL XL) 100 MG 24 hr tablet Take 1 & 1/2  Tablets daily (Patient taking differently: Take 150 mg by mouth daily. ) 30 tablet 0  . nitroGLYCERIN (NITROSTAT) 0.4 MG SL tablet Place 1 tablet (0.4 mg total) under the tongue every 5 (five) minutes as needed for chest pain. 25 tablet 1  . pantoprazole (PROTONIX) 40 MG tablet Take 1 tablet (40 mg total) by mouth 2 (two) times daily before a meal. 60 tablet 3  . rosuvastatin (CRESTOR) 40 MG tablet Take 1 tablet (40 mg total) by mouth daily. Please call to make appointment 15 tablet 0  . ticagrelor (BRILINTA) 90 MG TABS tablet Take 1 tablet (90 mg total) by mouth 2 (two) times daily. 60 tablet 7  . furosemide (LASIX) 40 MG tablet Take 40 mg by mouth daily.    . hydrALAZINE (APRESOLINE) 50 MG tablet Take 1 tablet (50 mg total) by mouth every 8 (eight) hours. HOLD until follow -up with your doctor (see instructions) (Patient taking differently: Take 50 mg by mouth every 8 (eight) hours. )  11  . isosorbide mononitrate (IMDUR) 120 MG 24 hr tablet Take 1 tablet (120 mg total) by mouth daily. HOLD until follow -up with your doctor (see instructions) (Patient taking differently: Take 120 mg by mouth daily. ) 90 tablet 2   No facility-administered medications prior to visit.      Allergies:   Chlorthalidone   Social History   Socioeconomic History  . Marital status: Married    Spouse name: Not on file  . Number of children: Not on file  . Years of education: Not on file  . Highest education level: Not on file  Occupational History  . Not on  file  Social Needs  . Financial resource strain: Not on file  . Food insecurity:    Worry: Not on file    Inability: Not on file  . Transportation needs:    Medical: Not on file    Non-medical: Not on file  Tobacco Use  . Smoking status: Never Smoker  . Smokeless tobacco: Never Used  Substance and Sexual Activity  . Alcohol use: Yes    Comment: social   . Drug use: No  . Sexual activity: Yes  Lifestyle  . Physical activity:    Days per week: Not on file    Minutes per session: Not on file  . Stress: Not on file  Relationships  . Social connections:    Talks on phone: Not on file    Gets together: Not on file    Attends religious service: Not on file    Active member of club or organization: Not on file    Attends meetings of clubs or organizations: Not on file    Relationship status: Not on file  Other Topics Concern  . Not on file  Social History Narrative  . Not on file     Family History:  The patient's family history includes Diabetes in his maternal grandmother; Heart attack in his maternal aunt; Hypertension in his maternal grandmother.   ROS:   Please see the history of present illness.    ROS All other systems reviewed and are negative.   PHYSICAL EXAM:   VS:  BP (!) 178/102   Pulse 77   Ht '5\' 9"'  (1.753 m)   Wt (!) 313 lb 12.8 oz (142.3 kg)   BMI  46.34 kg/m    GEN: Well nourished, well developed, in no acute distress  HEENT: normal  Neck: no JVD, carotid bruits, or masses Cardiac: RRR; no murmurs, rubs, or gallops. 2+ pitting edema  Respiratory:  clear to auscultation bilaterally, normal work of breathing GI: soft, nontender, nondistended, + BS MS: no deformity or atrophy  Skin: warm and dry, no rash Neuro:  Alert and Oriented x 3, Strength and sensation are intact Psych: euthymic mood, full affect  Wt Readings from Last 3 Encounters:  12/22/18 (!) 313 lb 12.8 oz (142.3 kg)  11/29/18 (!) 306 lb 3.2 oz (138.9 kg)  03/22/18 251 lb 12.3 oz (114.2  kg)      Studies/Labs Reviewed:   EKG:  EKG is not ordered today.   Recent Labs: 03/16/2018: TSH 0.608 03/20/2018: Magnesium 2.3 03/22/2018: ALT 23 11/29/2018: Hemoglobin 13.4; Platelets 264 12/11/2018: BUN 44; Creatinine, Ser 2.11; Potassium 4.7; Sodium 134   Lipid Panel    Component Value Date/Time   CHOL 158 11/17/2016 0921   TRIG 116 11/17/2016 0921   HDL 74 11/17/2016 0921   CHOLHDL 2.1 11/17/2016 0921   VLDL 23 11/17/2016 0921   LDLCALC 61 11/17/2016 0921    Additional studies/ records that were reviewed today include:   Cath 07/29/2016  Mid RCA lesion, 50 %stenosed.  Mid RCA to Dist RCA lesion, 10 %stenosed.  RPDA lesion, 30 %stenosed.  Mid Cx lesion, 75 %stenosed.  3rd Mrg lesion, 30 %stenosed.  Ost 1st Diag to 1st Diag lesion, 40 %stenosed.  Dist LAD lesion, 10 %stenosed.  Ost LAD to Prox LAD lesion, 85 %stenosed.  A STENT SYNERGY DES 3.5X20 drug eluting stent was successfully placed, and overlaps previously placed stent.  Prox LAD to Mid LAD lesion, 85 %stenosed.  Post intervention, there is a 0% residual stenosis.  A STENT SYNERGY DES [id number] drug eluting stent was successfully placed, and does not overlap previously placed stent.  2nd Mrg lesion, 95 %stenosed.  Post intervention, there is a 0% residual stenosis.    Successful stenting of proximal 85% LAD stenosis with a Synergy 3.5 x 20 drug-eluting stent from the LAD ostium to the proximal vessel overlapping previously placed stents. A 0% stenosis is noted post implantation. Final postdilatation diameter of 3.75 mm.  Successful stenting of the second obtuse marginal from 90% to 0% using a Synergy 2.75 x 16 mm DES deployed at 14 atm. Postdilatation was not performed.  Total contrast 95 cc  RECOMMENDATIONS:  IV fluids at 75 cc by our for 18 hours. Basic metabolic panel in a.m.   ASSESSMENT:    1. Precordial pain   2. Coronary artery disease involving native coronary artery of native heart  with angina pectoris ([place])   3. CKD (chronic kidney disease), stage IV ([place])   4. Controlled type 2 diabetes mellitus without complication, with long-term current use of insulin ([place])   5. OSA on CPAP   6. Essential hypertension   7. Hyperlipidemia LDL goal <70   8. Lower extremity edema      PLAN:  In order of problems listed above:  1. Precordial chest pain: His symptom is very concerning for angina.  However creatinine worsened to the degree that contrast nephropathy is at increased risk.  Gust the case with Dr. Claiborne Billings, we plan to obtain outpatient Lexiscan stress test to risk stratify the patient.  If a large area of myocardium is at risk, then we will need to potentially discuss proceeding with cardiac catheterization given knowing  the risk of contrast nephropathy.  If so, may need to admit the patient overnight for IV hydration.  2. CAD: On aspirin and store  3. CKD stage IV: Previous creatinine was 1.6, however recent creatinine has trended up to 2.1.  Even after holding the diuretic, there was no change in the renal function.  4. Blood pressure elevated today, increase antianginal medication Imdur to 120 mg twice daily  5. Hyperlipidemia: On Crestor 40 mg daily  6. Lower extremity edema: Lower extremity edema has worsened after I held his diuretic, will reinitiate torsemide at this point.    Medication Adjustments/Labs and Tests Ordered: Current medicines are reviewed at length with the patient today.  Concerns regarding medicines are outlined above.  Medication changes, Labs and Tests ordered today are listed in the Patient Instructions below. Patient Instructions  Medication Instructions:  START FUROSEMIDE 40 MG ONCE DAILY  INCREASE HYDRALAZINE TO 50 MG THREE TIMES DAILY  INCREASE ISOSORBIDE TO 120 MG TWICE DAILY If you need a refill on your cardiac medications before your next appointment, please call your pharmacy.   Lab work: Your physician recommends that you  return for lab work in: [place] If you have labs (blood work) drawn today and your tests are completely normal, you will receive your results only by: [name] MyChart Message (if you have MyChart) OR . A paper copy in the mail If you have any lab test that is abnormal or we need to change your treatment, we will call you to review the results.  Testing/Procedures: Your physician has requested that you have a lexiscan myoview. For further information please visit [email or web address]. Please follow instruction sheet, as given.    Follow-Up: At [facility], you and your health needs are our priority.  As part of our continuing mission to provide you with exceptional heart care, we have created designated Provider Care Teams.  These Care Teams include your primary Cardiologist (physician) and Advanced Practice Providers (APPs -  Physician Assistants and Nurse Practitioners) who all work together to provide you with the care you need, when you need it.  Your physician recommends that you schedule a follow-up appointment in: [place] PA-C      Weston Brass Almyra Deforest, [place]  12/24/2018 10:40 PM    [facility] [place], [place], [place]  [address] Phone: [phone]; Fax: [phone]

## 2018-12-22 NOTE — Patient Instructions (Signed)
Medication Instructions:  START FUROSEMIDE 40 MG ONCE DAILY  INCREASE HYDRALAZINE TO 50 MG THREE TIMES DAILY  INCREASE ISOSORBIDE TO 120 MG TWICE DAILY If you need a refill on your cardiac medications before your next appointment, please call your pharmacy.   Lab work: Your physician recommends that you return for lab work in: [place] If you have labs (blood work) drawn today and your tests are completely normal, you will receive your results only by: [name] MyChart Message (if you have MyChart) OR . A paper copy in the mail If you have any lab test that is abnormal or we need to change your treatment, we will call you to review the results.  Testing/Procedures: Your physician has requested that you have a lexiscan myoview. For further information please visit [email or web address]. Please follow instruction sheet, as given.    Follow-Up: At [name], you and your health needs are our priority.  As part of our continuing mission to provide you with exceptional heart care, we have created designated Provider Care Teams.  These Care Teams include your primary Cardiologist (physician) and Advanced Practice Providers (APPs -  Physician Assistants and Nurse Practitioners) who all work together to provide you with the care you need, when you need it.  Your physician recommends that you schedule a follow-up appointment in: 3 [place] PA-C

## 2018-12-24 ENCOUNTER — Encounter: Payer: Self-pay | Admitting: Physician Assistant

## 2018-12-26 ENCOUNTER — Ambulatory Visit (HOSPITAL_COMMUNITY)
Admission: RE | Admit: 2018-12-26 | Discharge: 2018-12-26 | Disposition: A | Discharge status: Home / Self Care | Payer: Medicaid Other | Source: Ambulatory Visit | Attending: Cardiovascular Disease | Admitting: Cardiovascular Disease

## 2018-12-26 DIAGNOSIS — R072 Precordial pain: Secondary | ICD-10-CM | POA: Insufficient documentation

## 2018-12-26 MED ORDER — TECHNETIUM TC 99M TETROFOSMIN IV KIT
30.2000 | PACK | Freq: Once | INTRAVENOUS | Status: AC | PRN
  Administered 2018-12-26: 30.2 via INTRAVENOUS
  Filled 2018-12-26: qty 31

## 2018-12-26 MED ORDER — REGADENOSON 0.4 MG/5ML IV SOLN
0.4000 mg | Freq: Once | INTRAVENOUS | Status: AC
  Administered 2018-12-26: 0.4 mg via INTRAVENOUS

## 2018-12-27 ENCOUNTER — Ambulatory Visit (HOSPITAL_COMMUNITY)
Admission: RE | Admit: 2018-12-27 | Discharge: 2018-12-27 | Disposition: A | Discharge status: Home / Self Care | Payer: Medicaid Other | Source: Ambulatory Visit | Attending: Cardiology | Admitting: Cardiology

## 2018-12-27 LAB — MYOCARDIAL PERFUSION IMAGING
LV dias vol: 138 mL (ref 62–150)
LV sys vol: 93 mL
Peak HR: 85 {beats}/min
Rest HR: 74 {beats}/min
SDS: 2
SRS: 3
SSS: 5
TID: 1.16

## 2018-12-27 MED ORDER — TECHNETIUM TC 99M TETROFOSMIN IV KIT
31.2000 | PACK | Freq: Once | INTRAVENOUS | Status: AC | PRN
  Administered 2018-12-27: 31.2 via INTRAVENOUS

## 2019-01-03 NOTE — Progress Notes (Signed)
I have personally called the patient with regard to the result. Medical management. Will discuss with Dr. Claiborne Billings once he is back.

## 2019-01-08 ENCOUNTER — Other Ambulatory Visit: Payer: Self-pay | Admitting: Physician Assistant

## 2019-01-08 NOTE — Telephone Encounter (Signed)
New Message     *STAT* If patient is at the pharmacy, call can be transferred to refill team.   1. Which medications need to be refilled? (please list name of each medication and dose if known) pantoprazole (PROTONIX) 40 MG tablet  2. Which pharmacy/location (including street and city if local pharmacy) is medication to be sent to? [place], [address]  3. Do they need a 30 day or 90 day supply? [place]

## 2019-01-12 ENCOUNTER — Encounter: Payer: Self-pay | Admitting: Physician Assistant

## 2019-01-12 ENCOUNTER — Ambulatory Visit (INDEPENDENT_AMBULATORY_CARE_PROVIDER_SITE_OTHER): Payer: Medicaid Other | Admitting: Physician Assistant

## 2019-01-12 VITALS — BP 160/80 | HR 77 | Ht 69.0 in | Wt 299.6 lb

## 2019-01-12 DIAGNOSIS — I2510 Atherosclerotic heart disease of native coronary artery without angina pectoris: Secondary | ICD-10-CM

## 2019-01-12 DIAGNOSIS — N183 Chronic kidney disease, stage 3 unspecified: Secondary | ICD-10-CM

## 2019-01-12 DIAGNOSIS — E119 Type 2 diabetes mellitus without complications: Secondary | ICD-10-CM

## 2019-01-12 DIAGNOSIS — E785 Hyperlipidemia, unspecified: Secondary | ICD-10-CM

## 2019-01-12 DIAGNOSIS — I10 Essential (primary) hypertension: Secondary | ICD-10-CM | POA: Diagnosis not present

## 2019-01-12 DIAGNOSIS — D860 Sarcoidosis of lung: Secondary | ICD-10-CM

## 2019-01-12 DIAGNOSIS — R072 Precordial pain: Secondary | ICD-10-CM | POA: Diagnosis not present

## 2019-01-12 DIAGNOSIS — Z794 Long term (current) use of insulin: Secondary | ICD-10-CM

## 2019-01-12 MED ORDER — FUROSEMIDE 40 MG PO TABS: 40.0000 mg | ORAL_TABLET | Freq: Every day | ORAL | 3 refills | Status: DC

## 2019-01-12 MED ORDER — ROSUVASTATIN CALCIUM 40 MG PO TABS: 40.0000 mg | ORAL_TABLET | Freq: Every day | ORAL | 6 refills | Status: DC

## 2019-01-12 MED ORDER — NITROGLYCERIN 0.4 MG SL SUBL: 0.4000 mg | SUBLINGUAL_TABLET | SUBLINGUAL | 1 refills | Status: DC | PRN

## 2019-01-12 MED ORDER — TICAGRELOR 90 MG PO TABS: 90.0000 mg | ORAL_TABLET | Freq: Two times a day (BID) | ORAL | 6 refills | Status: DC

## 2019-01-12 MED ORDER — PANTOPRAZOLE SODIUM 40 MG PO TBEC: 40.0000 mg | DELAYED_RELEASE_TABLET | Freq: Two times a day (BID) | ORAL | 0 refills | Status: DC

## 2019-01-12 MED ORDER — AMLODIPINE BESYLATE 10 MG PO TABS: 10.0000 mg | ORAL_TABLET | Freq: Every day | ORAL | 3 refills | Status: DC

## 2019-01-12 MED ORDER — HYDRALAZINE HCL 50 MG PO TABS: 50.0000 mg | ORAL_TABLET | Freq: Three times a day (TID) | ORAL | 3 refills | Status: DC

## 2019-01-12 MED ORDER — ISOSORBIDE MONONITRATE ER 120 MG PO TB24: 120.0000 mg | ORAL_TABLET | Freq: Two times a day (BID) | ORAL | 3 refills | Status: DC

## 2019-01-12 MED ORDER — METOPROLOL SUCCINATE ER 100 MG PO TB24: ORAL_TABLET | ORAL | 6 refills | Status: DC

## 2019-01-12 NOTE — Progress Notes (Signed)
Cardiology Office Note    Date:  01/14/2019   ID:  Joshua, [name] 1956-05-01, MRN [id number]  PCP:  Joshua Dus, MD  Cardiologist:  Dr. Claiborne [name]   Chief Complaint  Patient presents with  . Follow-up    seen for Dr Joshua [name] [name]    History of Present Illness:  Joshua [name] [name] is a 63 y.o. male with PMH ofCAD, CKD stage III, DM 2 with history of DKA, HTN, HLD, OSA on CPAP and history of pulmonary sarcoidosis. He had anterior lateral STEMI in November 2012 requiring 3.0 x 32 mm Promus DES placed in the LAD, he subsequently had staged PCI of mid RCA with another drug-eluting stent. He had another cardiac catheterization in August 2016, LAD stent was patent, there was 40% D1 lesion, 60% lesion in left circumflex artery, 50% in-stent restenosis in the RCA. Echocardiogram obtained at the time showed EF 45 to 50% with moderate LVH, grade 1 DD. He was placed on Protonix for management of GERD. He was readmitted in August 2017 with NSTEMI, repeat cardiac catheterization showed new proximal LAD lesion and 95% OM1 lesion. He underwent another stenting of LAD and left circumflex vessel with synergy stents. He was readmitted in October 2017 with atypical chest pain. Myoview at the time showed EF 39% without ischemia. Patient was last seen by Dr. Claiborne [name] in December 2018, at which time he denies any further anginal symptoms. Since the last office visit, patient was admitted with DKA and severe dehydration in the setting of uncontrolled type II DM in April 2019. On arrival his creatinine was 5.25, CBG of 1241, aniongap was over 35. Hemoglobin A1c was greater than 15.5. On further discussion, he apparently lost his job and was not taking the insulin for more than 2-1/2 weeks.  I saw the patient in December 2019, he was complaining of exertional chest discomfort for 2 months.  Therefore we recommended outpatient cardiac cath however his renal function was down and creatinine came back 2.2.  Cardiac  catheterization was delayed.  I stopped his diuretic at the repeated the lab work, creatinine still came back around 2.1.  I discussed the case with Dr. Claiborne [name] and we eventually decided to use Lexiscan stress test to risk stratify.  I also increase his Imdur to 120 mg twice daily for antianginal purposes.  I increase his hydralazine to 3 times a day for better control.  Myoview was obtained on 12/26/2018 which showed EF 33%, medium sized moderate intensity severe fixed septal and anteroseptal perfusion defect which could represent scar or artifact, no significant reversible ischemia noted.  Overall this is a high risk study due to decreased LVEF which has been reduced from the previous study in 2017.  Patient presents today for cardiology office visit.  He does have some chest tightness recently, especially with swallowing.  He says he ran out of his pantoprazole that usually control as reflux.  I am okay to give him 62-monthsupply of Protonix, however if he needs more he will need to discuss with his PCP.  He also ran out of iron Imdur 2 days ago as well, I will refill this medication.  Otherwise, because he has some discomfort with swallowing, he has not been eating very well therefore has lost significant amount of weight.  He also says he has been urinating more on the same dose of diuretic, I will defer the management of his renal function to his nephrologist.  As far as his blood sugar, he says  some days his blood sugar would be down to 70s, whereas other days, his blood sugar is quite high.  Even on the previous lab work in December, his glucose ranges between 63-561.  This is being managed by her primary care provider as well.  Overall recent stress test is reassuring, I would recommend medical management of his coronary artery disease.    Past Medical History:  Diagnosis Date  . Anemia   . Chronic combined systolic and diastolic CHF (congestive heart failure) ([place])    a. 07/2015 Echo: EF 45-50%, mod  LVH, mid apical/antsept AK, Gr 1 DD.  [name] CKD (chronic kidney disease), stage III ([place])   . Coronary artery disease    a. 2012 s/p MI/PCI: s/p DES to mid/dist RCA and overlapping DES to LAD;  b. 07/2015 Cath: LAD 10% ISR, D1 40(jailed), LCX 33m OM2 30, OM3 30, RCA 565mSR, 10d ISR; c. 07/2016 NSTEMI/PCI: LAD 85ost/p/m (3.5x20 Synergy DES overlapping prior stent), D1 40, LCX 7568mM2 95 (2.75x16 Synergy DES), OM3 30, RCA 64m64md.  . Depression   . Diabetic gastroparesis (HCC)[place]. DKA (diabetic ketoacidoses) (HCC)[place]/01/2018  . GERD (gastroesophageal reflux disease)   . Gout   . Gout   . Heart murmur   . Hyperlipemia   . Hypertensive heart disease   . Ischemic cardiomyopathy    a. 07/2015 Echo: EF 45-50%, mod LVH, mid-apicalanteroseptal AK, Gr1 DD.  . Morbid obesity (HCC)[place]. Myocardial infarction (HCC)[place]  . OSA on CPAP   . Osteomyelitis (HCC)[place] a. 07/2016 MTP joint of R great toe s/p transmetatarsal amputation.  . Polyneuropathy in diabetes(357.2)   . Pulmonary sarcoidosis (HCC)[place] "problems w/it years ago; no problems anymore" (07/03/2014)  . Rectal bleeding   . Type II diabetes mellitus (HCC)[place]  Past Surgical History:  Procedure Laterality Date  . AMPUTATION Right 07/24/2016   Procedure: TRANSMETATARSAL FOOT AMPUTATION;  Surgeon: MarcNewt Minion;  [facility] [place];  Laterality: Right;  . BALLOON DILATION N/A 03/21/2018   Procedure: BALLOON DILATION;  Surgeon: BuccRonald Lobo;  [facility];  Service: Endoscopy;  Laterality: N/A;  . BREAST LUMPECTOMY Right ~ 2000   "benign"  . CARDIAC CATHETERIZATION N/A 07/30/2015   Procedure: Left Heart Cath and Coronary Angiography;  Surgeon: ChriBurnell Blanks;  [facility];  Service: Cardiovascular;  Laterality: N/A;  . CARDIAC CATHETERIZATION N/A 07/19/2016   Procedure: Left Heart Cath and Coronary Angiography;  Surgeon: HenrBelva Crome;  [facility];  Service:  Cardiovascular;  Laterality: N/A;  . CARDIAC CATHETERIZATION N/A 07/29/2016   Procedure: Coronary Stent Intervention;  Surgeon: HenrBelva Crome;  [facility];  Service: Cardiovascular;  Laterality: N/A;  . CARDIAC CATHETERIZATION N/A 07/29/2016   Procedure: Intravascular Ultrasound/IVUS;  Surgeon: HenrBelva Crome;  [facility];  Service: Cardiovascular;  Laterality: N/A;  . CATARACT EXTRACTION W/ INTRAOCULAR LENS  IMPLANT, BILATERAL Bilateral 2014-2015  . COLONOSCOPY WITH PROPOFOL N/A 08/22/2017   Procedure: COLONOSCOPY WITH PROPOFOL;  Surgeon: SchoWilford Corner;  [facility];  Laterality: N/A;  . CORONARY ANGIOPLASTY WITH STENT PLACEMENT  10/31/2011   30 % MID ATRIOVENTRICULAR GROOVE CX STENOSIS. 90 % MID RCA.PTA TO LAD/STENTING OF TANDEM 60-70%. LAD STENOSIS 99% REDUCED TO 0%.3.0 X 32MM PROMUS DES POSTDILATED TO 3.25MM.  . COMarland KitchenONARY ANGIOPLASTY  WITH STENT PLACEMENT  07/03/2014   "2"  . ESOPHAGOGASTRODUODENOSCOPY (EGD) WITH PROPOFOL N/A 03/21/2018   Procedure: ESOPHAGOGASTRODUODENOSCOPY (EGD) WITH PROPOFOL;  Surgeon: [name], MD;  Location: [place];  Service: Endoscopy;  Laterality: N/A;  . I&D EXTREMITY Right 10/30/2013   Procedure: IRRIGATION AND DEBRIDEMENT RIGHT SMALL FINGER POSSIBLE SECONDARY WOUND CLOSURE;  Surgeon: [name], MD;  Location: [facility];  Service: Orthopedics;  Laterality: Right;  [name] is available after 4pm  . I&D EXTREMITY Right 11/01/2013   Procedure:  IRRIGATION AND DEBRIDEMENT RIGHT  PROXIMAL PHALANGEAL LEVEL AMPUTATION;  Surgeon: [name], MD;  Location: [facility];  Service: Orthopedics;  Laterality: Right;  . INCISION AND DRAINAGE Right 10/28/2013   Procedure: INCISION AND DRAINAGE Right small finger;  Surgeon: [name], MD;  Location: [facility];  Service: Orthopedics;  Laterality: Right;  . LEFT HEART CATH Left 11/03/2011   Procedure: LEFT HEART CATH;  Surgeon:  [name], MD;  [facility];  Service: Cardiovascular;  Laterality: Left;  . LEFT HEART CATHETERIZATION WITH CORONARY ANGIOGRAM N/A 07/03/2014   Procedure: LEFT HEART CATHETERIZATION WITH CORONARY ANGIOGRAM;  Surgeon: [name], MD;  Location: [facility] CATH LAB;  Service: Cardiovascular;  Laterality: N/A;  . PERCUTANEOUS CORONARY STENT INTERVENTION (PCI-S) Left 11/03/2011   Procedure: PERCUTANEOUS CORONARY STENT INTERVENTION (PCI-S);  Surgeon: [name], MD;  Location: [facility] CATH LAB;  Service: Cardiovascular;  Laterality: Left;  . TOE AMPUTATION Right ~ 2010   "2nd toe"  . TOE AMPUTATION Right 2012   "3rd toe"    Current Medications: Outpatient Medications Prior to Visit  Medication Sig Dispense Refill  . acetaminophen (TYLENOL) 650 MG CR tablet Take 1,300 mg by mouth daily as needed for pain.    [name] allopurinol (ZYLOPRIM) 300 MG tablet Take 300 mg by mouth daily.     [name] aspirin EC 81 MG tablet Take 81 mg by mouth daily.     . blood glucose meter kit and supplies KIT Dispense based on patient and insurance preference. Use up to four times daily as directed. (FOR ICD-9 250.00, 250.01).  RELION glucometer 1 each 0  . Camphor-Menthol-Methyl Sal (HM SALONPAS PAIN RELIEF EX) Apply 1 spray topically daily as needed (pain).    . insulin NPH Human (HUMULIN N,NOVOLIN N) 100 UNIT/ML injection Inject 0.22 mLs (22 Units total) into the skin 2 (two) times daily before a meal. Please finish this insulin vial at home 10 mL 11  . insulin NPH-regular Human (NOVOLIN 70/30 RELION) (70-30) 100 UNIT/ML injection Inject 34 Units into the skin 2 (two) times daily with a meal. Please fill this prescription only when you have finished your insulin at home (Patient taking differently: Inject 22-34 Units into the skin See admin instructions. Inject 34 units twice daily unless blood sugar is under 100 inject only 22 units) 10 mL 11  . insulin regular (NOVOLIN R,HUMULIN R) 100 units/mL injection Sliding  scale  CBG 70 - 120: 0 units: CBG 121 - 150: 2 units; CBG 151 - 200: 3 units; CBG 201 - 250: 5 units; CBG 251 - 300: 8 units;CBG 301 - 350: 11 units; CBG 351 - 400: 15 units; CBG > 400 : 15 units and notify your doctor  Please finish the insulin vial at home (Patient taking differently: Inject 2-15 Units into the skin 3 (three) times daily with meals. Sliding scale  CBG 70 - 120: 0 units: CBG 121 - 150: 2 units; CBG 151 -  200: 3 units; CBG 201 - 250: 5 units; CBG 251 - 300: 8 units;CBG 301 - 350: 11 units; CBG 351 - 400: 15 units; CBG > 400 : 15 units and notify your doctor  Please finish the insulin vial at home) 10 mL 11  . metoCLOPramide (REGLAN) 10 MG tablet Take 1 tablet (10 mg total) by mouth every 8 (eight) hours as needed for nausea. 30 tablet 1  . amLODipine (NORVASC) 10 MG tablet Take 1 tablet (10 mg total) by mouth daily. HOLD until follow -up with your doctor (see instructions) 90 tablet 1  . furosemide (LASIX) 40 MG tablet Take 1 tablet (40 mg total) by mouth daily. 90 tablet 3  . hydrALAZINE (APRESOLINE) 50 MG tablet Take 1 tablet (50 mg total) by mouth every 8 (eight) hours. (Patient taking differently: Take 50 mg by mouth 3 (three) times daily. ) 270 tablet 3  . isosorbide mononitrate (IMDUR) 120 MG 24 hr tablet Take 1 tablet (120 mg total) by mouth 2 (two) times daily. HOLD until follow -up with your doctor (see instructions) 180 tablet 3  . metoprolol succinate (TOPROL XL) 100 MG 24 hr tablet Take 1 & 1/2  Tablets daily (Patient taking differently: Take 150 mg by mouth daily. ) 30 tablet 0  . nitroGLYCERIN (NITROSTAT) 0.4 MG SL tablet Place 1 tablet (0.4 mg total) under the tongue every 5 (five) minutes as needed for chest pain. 25 tablet 1  . pantoprazole (PROTONIX) 40 MG tablet Take 1 tablet (40 mg total) by mouth 2 (two) times daily before a meal. 60 tablet 3  . rosuvastatin (CRESTOR) 40 MG tablet Take 1 tablet (40 mg total) by mouth daily. Please call to make appointment 15 tablet  0  . ticagrelor (BRILINTA) 90 MG TABS tablet Take 1 tablet (90 mg total) by mouth 2 (two) times daily. 60 tablet 7   No facility-administered medications prior to visit.      Allergies:   Chlorthalidone   Social History   Socioeconomic History  . Marital status: Married    Spouse name: Not on file  . Number of children: Not on file  . Years of education: Not on file  . Highest education level: Not on file  Occupational History  . Not on file  Social Needs  . Financial resource strain: Not on file  . Food insecurity:    Worry: Not on file    Inability: Not on file  . Transportation needs:    Medical: Not on file    Non-medical: Not on file  Tobacco Use  . Smoking status: Never Smoker  . Smokeless tobacco: Never Used  Substance and Sexual Activity  . Alcohol use: Yes    Comment: social   . Drug use: No  . Sexual activity: Yes  Lifestyle  . Physical activity:    Days per week: Not on file    Minutes per session: Not on file  . Stress: Not on file  Relationships  . Social connections:    Talks on phone: Not on file    Gets together: Not on file    Attends religious service: Not on file    Active member of club or organization: Not on file    Attends meetings of clubs or organizations: Not on file    Relationship status: Not on file  Other Topics Concern  . Not on file  Social History Narrative  . Not on file     Family History:  The  patient's family history includes Diabetes in his maternal grandmother; Heart attack in his maternal aunt; Hypertension in his maternal grandmother.   ROS:   Please see the history of present illness.    ROS All other systems reviewed and are negative.   PHYSICAL EXAM:   VS:  BP (!) 160/80   Pulse 77   Ht '5\' 9"'  (1.753 m)   Wt 299 lb 9.6 oz (135.9 kg)   BMI 44.24 kg/m    GEN: Well nourished, well developed, in no acute distress  HEENT: normal  Neck: no JVD, carotid bruits, or masses Cardiac: RRR; no murmurs, rubs, or  gallops,no edema  Respiratory:  clear to auscultation bilaterally, normal work of breathing GI: soft, nontender, nondistended, + BS MS: no deformity or atrophy  Skin: warm and dry, no rash Neuro:  Alert and Oriented x 3, Strength and sensation are intact Psych: euthymic mood, full affect  Wt Readings from Last 3 Encounters:  01/12/19 299 lb 9.6 oz (135.9 kg)  12/26/18 (!) 313 lb (142 kg)  12/22/18 (!) 313 lb 12.8 oz (142.3 kg)      Studies/Labs Reviewed:   EKG:  EKG is not ordered today.    Recent Labs: 03/16/2018: TSH 0.608 03/20/2018: Magnesium 2.3 03/22/2018: ALT 23 11/29/2018: Hemoglobin 13.4; Platelets 264 12/11/2018: BUN 44; Creatinine, Ser 2.11; Potassium 4.7; Sodium 134   Lipid Panel    Component Value Date/Time   CHOL 158 11/17/2016 0921   TRIG 116 11/17/2016 0921   HDL 74 11/17/2016 0921   CHOLHDL 2.1 11/17/2016 0921   VLDL 23 11/17/2016 0921   LDLCALC 61 11/17/2016 0921    Additional studies/ records that were reviewed today include:   Myoview 12/26/2018  Nuclear stress EF: 33%.  The left ventricular ejection fraction is moderately decreased (30-44%).  No T wave inversion was noted during stress.  There was no ST segment deviation noted during stress.  Defect 1: There is a medium defect of moderate severity.  This is a high risk study.   Medium sized, moderate intensity fixed septal and anteroseptal perfusion defect which could represent scar or artifact. No significant reversible ischemia. LVEF 33% with inferoseptal and septal hypokinesis/dyskinesis. This is a high risk study for decreased LVEF which is reduced from a prior study in 2017.    ASSESSMENT:    1. Precordial pain   2. Coronary artery disease involving native coronary artery of native heart without angina pectoris   3. CKD (chronic kidney disease), stage III ([place])   4. Essential hypertension   5. Hyperlipidemia LDL goal <70   6. Controlled type 2 diabetes mellitus without complication,  with long-term current use of insulin (HCC)   7. Pulmonary sarcoidosis (HCC)      PLAN:  In order of problems listed above:  1. Chest pain: Recently underwent a Myoview which mainly showed scar without significant ischemia.  Plan to proceed with medical management.  He has ran out of multiple medications recently, I have refilled them today  2. CAD: Still has occasional chest discomfort, however patient ran out of medication in the past 2 days.  I will refill his medication include Imdur.  Continue aspirin and Brilinta  3. Hypertension: Blood pressure elevated today, ran out of multiple medications, will refill  4. Hyperlipidemia: On Crestor 40 mg daily  5. DM2: On insulin, managed by primary care provider  6. Pulmonary sarcoidosis: Managed by pulmonology service, denies any worsening shortness of breath recently.    Medication Adjustments/Labs and Tests  Ordered: Current medicines are reviewed at length with the patient today.  Concerns regarding medicines are outlined above.  Medication changes, Labs and Tests ordered today are listed in the Patient Instructions below. Patient Instructions  Medication Instructions:  Your physician recommends that you continue on your current medications as directed. Please refer to the Current Medication list given to you today.  If you need a refill on your cardiac medications before your next appointment, please call your pharmacy.   Follow-Up: At [facility], you and your health needs are our priority.  As part of our continuing mission to provide you with exceptional heart care, we have created designated Provider Care Teams.  These Care Teams include your primary Cardiologist (physician) and Advanced Practice Providers (APPs -  Physician Assistants and Nurse Practitioners) who all work together to provide you with the care you need, when you need it. You will need a follow up appointment in 3 months  With Dr. Claiborne [name] or one of the following  Advanced Practice Providers on your designated Care Team: [place], [place] . Fabian Sharp, PA-C  Any Other Special Instructions Will Be Listed Below (If Applicable). None      Hilbert Corrigan, [place]  01/14/2019 11:32 PM    [facility] [address], [place], [place]  [address] Phone: [phone]; Fax: [phone]

## 2019-01-12 NOTE — Patient Instructions (Signed)
Medication Instructions:  Your physician recommends that you continue on your current medications as directed. Please refer to the Current Medication list given to you today.  If you need a refill on your cardiac medications before your next appointment, please call your pharmacy.   Follow-Up: At [facility], you and your health needs are our priority.  As part of our continuing mission to provide you with exceptional heart care, we have created designated Provider Care Teams.  These Care Teams include your primary Cardiologist (physician) and Advanced Practice Providers (APPs -  Physician Assistants and Nurse Practitioners) who all work together to provide you with the care you need, when you need it. You will need a follow up appointment in 3 months  With Dr. Claiborne Billings or one of the following Advanced Practice Providers on your designated Care Team: [place], [place] . Fabian Sharp, PA-C  Any Other Special Instructions Will Be Listed Below (If Applicable). None

## 2019-01-14 ENCOUNTER — Encounter: Payer: Self-pay | Admitting: Physician Assistant

## 2019-04-06 ENCOUNTER — Inpatient Hospital Stay (HOSPITAL_COMMUNITY)
Admission: EM | Admit: 2019-04-06 | Discharge: 2019-04-10 | DRG: 280 | Disposition: A | Discharge status: Home / Self Care | Payer: Medicaid Other | Attending: Interventional Cardiology | Admitting: Interventional Cardiology

## 2019-04-06 ENCOUNTER — Telehealth: Payer: Medicaid Other | Admitting: Physician Assistant

## 2019-04-06 ENCOUNTER — Other Ambulatory Visit: Payer: Self-pay

## 2019-04-06 ENCOUNTER — Emergency Department (HOSPITAL_COMMUNITY): Payer: Medicaid Other

## 2019-04-06 ENCOUNTER — Encounter (HOSPITAL_COMMUNITY): Payer: Self-pay | Admitting: Emergency Medicine

## 2019-04-06 DIAGNOSIS — Z9114 Patient's other noncompliance with medication regimen: Secondary | ICD-10-CM

## 2019-04-06 DIAGNOSIS — Z6841 Body Mass Index (BMI) 40.0 and over, adult: Secondary | ICD-10-CM | POA: Diagnosis present

## 2019-04-06 DIAGNOSIS — M109 Gout, unspecified: Secondary | ICD-10-CM | POA: Diagnosis present

## 2019-04-06 DIAGNOSIS — G4730 Sleep apnea, unspecified: Secondary | ICD-10-CM | POA: Diagnosis present

## 2019-04-06 DIAGNOSIS — I252 Old myocardial infarction: Secondary | ICD-10-CM

## 2019-04-06 DIAGNOSIS — R0602 Shortness of breath: Secondary | ICD-10-CM

## 2019-04-06 DIAGNOSIS — F329 Major depressive disorder, single episode, unspecified: Secondary | ICD-10-CM | POA: Diagnosis present

## 2019-04-06 DIAGNOSIS — K219 Gastro-esophageal reflux disease without esophagitis: Secondary | ICD-10-CM | POA: Diagnosis present

## 2019-04-06 DIAGNOSIS — E785 Hyperlipidemia, unspecified: Secondary | ICD-10-CM | POA: Diagnosis present

## 2019-04-06 DIAGNOSIS — E1143 Type 2 diabetes mellitus with diabetic autonomic (poly)neuropathy: Secondary | ICD-10-CM | POA: Diagnosis present

## 2019-04-06 DIAGNOSIS — I5042 Chronic combined systolic (congestive) and diastolic (congestive) heart failure: Secondary | ICD-10-CM | POA: Diagnosis present

## 2019-04-06 DIAGNOSIS — E662 Morbid (severe) obesity with alveolar hypoventilation: Secondary | ICD-10-CM | POA: Diagnosis present

## 2019-04-06 DIAGNOSIS — Z955 Presence of coronary angioplasty implant and graft: Secondary | ICD-10-CM

## 2019-04-06 DIAGNOSIS — Z7982 Long term (current) use of aspirin: Secondary | ICD-10-CM

## 2019-04-06 DIAGNOSIS — Z8249 Family history of ischemic heart disease and other diseases of the circulatory system: Secondary | ICD-10-CM

## 2019-04-06 DIAGNOSIS — E1165 Type 2 diabetes mellitus with hyperglycemia: Secondary | ICD-10-CM | POA: Diagnosis present

## 2019-04-06 DIAGNOSIS — I5032 Chronic diastolic (congestive) heart failure: Secondary | ICD-10-CM | POA: Diagnosis present

## 2019-04-06 DIAGNOSIS — I2510 Atherosclerotic heart disease of native coronary artery without angina pectoris: Secondary | ICD-10-CM | POA: Diagnosis not present

## 2019-04-06 DIAGNOSIS — Z9861 Coronary angioplasty status: Secondary | ICD-10-CM

## 2019-04-06 DIAGNOSIS — E1159 Type 2 diabetes mellitus with other circulatory complications: Secondary | ICD-10-CM | POA: Diagnosis present

## 2019-04-06 DIAGNOSIS — N183 Chronic kidney disease, stage 3 unspecified: Secondary | ICD-10-CM | POA: Diagnosis present

## 2019-04-06 DIAGNOSIS — E1122 Type 2 diabetes mellitus with diabetic chronic kidney disease: Secondary | ICD-10-CM | POA: Diagnosis present

## 2019-04-06 DIAGNOSIS — N179 Acute kidney failure, unspecified: Secondary | ICD-10-CM | POA: Diagnosis present

## 2019-04-06 DIAGNOSIS — Z79899 Other long term (current) drug therapy: Secondary | ICD-10-CM | POA: Diagnosis not present

## 2019-04-06 DIAGNOSIS — Z794 Long term (current) use of insulin: Secondary | ICD-10-CM

## 2019-04-06 DIAGNOSIS — N1832 Chronic kidney disease, stage 3b: Secondary | ICD-10-CM | POA: Diagnosis present

## 2019-04-06 DIAGNOSIS — I214 Non-ST elevation (NSTEMI) myocardial infarction: Secondary | ICD-10-CM | POA: Diagnosis present

## 2019-04-06 DIAGNOSIS — I10 Essential (primary) hypertension: Secondary | ICD-10-CM | POA: Diagnosis present

## 2019-04-06 DIAGNOSIS — Z833 Family history of diabetes mellitus: Secondary | ICD-10-CM

## 2019-04-06 DIAGNOSIS — Z20828 Contact with and (suspected) exposure to other viral communicable diseases: Secondary | ICD-10-CM | POA: Diagnosis present

## 2019-04-06 DIAGNOSIS — I130 Hypertensive heart and chronic kidney disease with heart failure and stage 1 through stage 4 chronic kidney disease, or unspecified chronic kidney disease: Secondary | ICD-10-CM | POA: Diagnosis present

## 2019-04-06 DIAGNOSIS — I5043 Acute on chronic combined systolic (congestive) and diastolic (congestive) heart failure: Secondary | ICD-10-CM | POA: Diagnosis present

## 2019-04-06 DIAGNOSIS — I249 Acute ischemic heart disease, unspecified: Secondary | ICD-10-CM | POA: Insufficient documentation

## 2019-04-06 DIAGNOSIS — I25110 Atherosclerotic heart disease of native coronary artery with unstable angina pectoris: Secondary | ICD-10-CM | POA: Diagnosis present

## 2019-04-06 DIAGNOSIS — Z89431 Acquired absence of right foot: Secondary | ICD-10-CM

## 2019-04-06 DIAGNOSIS — R072 Precordial pain: Secondary | ICD-10-CM

## 2019-04-06 DIAGNOSIS — Z888 Allergy status to other drugs, medicaments and biological substances status: Secondary | ICD-10-CM

## 2019-04-06 DIAGNOSIS — I152 Hypertension secondary to endocrine disorders: Secondary | ICD-10-CM | POA: Diagnosis present

## 2019-04-06 DIAGNOSIS — E119 Type 2 diabetes mellitus without complications: Secondary | ICD-10-CM | POA: Diagnosis present

## 2019-04-06 DIAGNOSIS — E1142 Type 2 diabetes mellitus with diabetic polyneuropathy: Secondary | ICD-10-CM | POA: Diagnosis present

## 2019-04-06 LAB — CBC WITH DIFFERENTIAL/PLATELET
Abs Immature Granulocytes: 0.12 K/uL — ABNORMAL HIGH (ref 0.00–0.07)
Basophils Absolute: 0 K/uL (ref 0.0–0.1)
Basophils Relative: 0 %
Eosinophils Absolute: 0.1 K/uL (ref 0.0–0.5)
Eosinophils Relative: 1 %
HCT: 38.3 % — ABNORMAL LOW (ref 39.0–52.0)
Hemoglobin: 12.2 g/dL — ABNORMAL LOW (ref 13.0–17.0)
Immature Granulocytes: 2 %
Lymphocytes Relative: 29 %
Lymphs Abs: 2.1 K/uL (ref 0.7–4.0)
MCH: 27.7 pg (ref 26.0–34.0)
MCHC: 31.9 g/dL (ref 30.0–36.0)
MCV: 86.8 fL (ref 80.0–100.0)
Monocytes Absolute: 0.8 K/uL (ref 0.1–1.0)
Monocytes Relative: 11 %
Neutro Abs: 4.2 K/uL (ref 1.7–7.7)
Neutrophils Relative %: 57 %
Platelets: 186 K/uL (ref 150–400)
RBC: 4.41 MIL/uL (ref 4.22–5.81)
RDW: 14.6 % (ref 11.5–15.5)
WBC: 7.3 K/uL (ref 4.0–10.5)
nRBC: 0 % (ref 0.0–0.2)

## 2019-04-06 LAB — COMPREHENSIVE METABOLIC PANEL WITH GFR
ALT: 16 U/L (ref 0–44)
AST: 21 U/L (ref 15–41)
Albumin: 3.7 g/dL (ref 3.5–5.0)
Alkaline Phosphatase: 107 U/L (ref 38–126)
Anion gap: 8 (ref 5–15)
BUN: 36 mg/dL — ABNORMAL HIGH (ref 6–20)
CO2: 24 mmol/L (ref 22–32)
Calcium: 8.7 mg/dL — ABNORMAL LOW (ref 8.9–10.3)
Chloride: 102 mmol/L (ref 98–111)
Creatinine, Ser: 2.01 mg/dL — ABNORMAL HIGH (ref 0.61–1.24)
GFR calc Af Amer: 41 mL/min — ABNORMAL LOW
GFR calc non Af Amer: 35 mL/min — ABNORMAL LOW
Glucose, Bld: 191 mg/dL — ABNORMAL HIGH (ref 70–99)
Potassium: 3.9 mmol/L (ref 3.5–5.1)
Sodium: 134 mmol/L — ABNORMAL LOW (ref 135–145)
Total Bilirubin: 0.5 mg/dL (ref 0.3–1.2)
Total Protein: 7.4 g/dL (ref 6.5–8.1)

## 2019-04-06 LAB — PROTIME-INR
INR: 0.9 (ref 0.8–1.2)
Prothrombin Time: 11.9 s (ref 11.4–15.2)

## 2019-04-06 LAB — TROPONIN I: Troponin I: 0.47 ng/mL (ref <0.03)

## 2019-04-06 LAB — BRAIN NATRIURETIC PEPTIDE: B Natriuretic Peptide: 76.7 pg/mL (ref 0.0–100.0)

## 2019-04-06 MED ORDER — METOCLOPRAMIDE HCL 10 MG PO TABS: 10.0000 mg | ORAL_TABLET | Freq: Three times a day (TID) | ORAL | Status: DC | PRN

## 2019-04-06 MED ORDER — ONDANSETRON HCL 4 MG/2ML IJ SOLN: 4.0000 mg | Freq: Four times a day (QID) | INTRAMUSCULAR | Status: DC | PRN

## 2019-04-06 MED ORDER — ROSUVASTATIN CALCIUM 20 MG PO TABS
40.0000 mg | ORAL_TABLET | Freq: Every day | ORAL | Status: DC
  Administered 2019-04-07 – 2019-04-10 (×4): 40 mg via ORAL
  Filled 2019-04-06 (×4): qty 2

## 2019-04-06 MED ORDER — HEPARIN (PORCINE) 25000 UT/250ML-% IV SOLN
1300.0000 [IU]/h | INTRAVENOUS | Status: DC
  Administered 2019-04-06: 1200 [IU]/h via INTRAVENOUS
  Administered 2019-04-07 – 2019-04-08 (×2): 1300 [IU]/h via INTRAVENOUS
  Filled 2019-04-06 (×3): qty 250

## 2019-04-06 MED ORDER — INSULIN NPH (HUMAN) (ISOPHANE) 100 UNIT/ML ~~LOC~~ SUSP
22.0000 [IU] | Freq: Two times a day (BID) | SUBCUTANEOUS | Status: DC
  Administered 2019-04-07: 22 [IU] via SUBCUTANEOUS
  Filled 2019-04-06: qty 10

## 2019-04-06 MED ORDER — TICAGRELOR 90 MG PO TABS
90.0000 mg | ORAL_TABLET | Freq: Two times a day (BID) | ORAL | Status: DC
  Administered 2019-04-07 – 2019-04-09 (×6): 90 mg via ORAL
  Filled 2019-04-06 (×6): qty 1

## 2019-04-06 MED ORDER — INSULIN ASPART 100 UNIT/ML ~~LOC~~ SOLN
0.0000 [IU] | Freq: Every day | SUBCUTANEOUS | Status: DC
  Administered 2019-04-07: 2 [IU] via SUBCUTANEOUS

## 2019-04-06 MED ORDER — ASPIRIN EC 81 MG PO TBEC
81.0000 mg | DELAYED_RELEASE_TABLET | Freq: Every day | ORAL | Status: DC
  Administered 2019-04-07 – 2019-04-09 (×3): 81 mg via ORAL
  Filled 2019-04-06 (×3): qty 1

## 2019-04-06 MED ORDER — NITROGLYCERIN 0.4 MG SL SUBL: 0.4000 mg | SUBLINGUAL_TABLET | SUBLINGUAL | Status: DC | PRN

## 2019-04-06 MED ORDER — ALLOPURINOL 300 MG PO TABS
300.0000 mg | ORAL_TABLET | Freq: Every day | ORAL | Status: DC
  Administered 2019-04-07 – 2019-04-10 (×4): 300 mg via ORAL
  Filled 2019-04-06 (×4): qty 1

## 2019-04-06 MED ORDER — ACETAMINOPHEN 325 MG PO TABS
650.0000 mg | ORAL_TABLET | ORAL | Status: DC | PRN
  Administered 2019-04-07 – 2019-04-08 (×2): 650 mg via ORAL
  Filled 2019-04-06 (×2): qty 2

## 2019-04-06 MED ORDER — AMLODIPINE BESYLATE 10 MG PO TABS
10.0000 mg | ORAL_TABLET | Freq: Every day | ORAL | Status: DC
  Administered 2019-04-07 – 2019-04-10 (×4): 10 mg via ORAL
  Filled 2019-04-06 (×4): qty 1

## 2019-04-06 MED ORDER — INSULIN ASPART 100 UNIT/ML ~~LOC~~ SOLN
0.0000 [IU] | Freq: Three times a day (TID) | SUBCUTANEOUS | Status: DC
  Administered 2019-04-07 (×2): 11 [IU] via SUBCUTANEOUS
  Administered 2019-04-07: 11:00:00 20 [IU] via SUBCUTANEOUS

## 2019-04-06 MED ORDER — FUROSEMIDE 10 MG/ML IJ SOLN
40.0000 mg | Freq: Two times a day (BID) | INTRAMUSCULAR | Status: DC
  Administered 2019-04-07: 08:00:00 40 mg via INTRAVENOUS
  Filled 2019-04-06: qty 4

## 2019-04-06 MED ORDER — ISOSORBIDE MONONITRATE ER 60 MG PO TB24
120.0000 mg | ORAL_TABLET | Freq: Two times a day (BID) | ORAL | Status: DC
  Administered 2019-04-07 – 2019-04-10 (×8): 120 mg via ORAL
  Filled 2019-04-06 (×8): qty 2

## 2019-04-06 MED ORDER — PANTOPRAZOLE SODIUM 40 MG PO TBEC
40.0000 mg | DELAYED_RELEASE_TABLET | Freq: Two times a day (BID) | ORAL | Status: DC
  Administered 2019-04-07 – 2019-04-10 (×7): 40 mg via ORAL
  Filled 2019-04-06 (×7): qty 1

## 2019-04-06 MED ORDER — METOPROLOL SUCCINATE ER 50 MG PO TB24
150.0000 mg | ORAL_TABLET | Freq: Every day | ORAL | Status: DC
  Administered 2019-04-07 – 2019-04-10 (×4): 150 mg via ORAL
  Filled 2019-04-06 (×4): qty 3

## 2019-04-06 MED ORDER — HEPARIN BOLUS VIA INFUSION
4000.0000 [IU] | Freq: Once | INTRAVENOUS | Status: AC
  Administered 2019-04-06: 22:00:00 4000 [IU] via INTRAVENOUS
  Filled 2019-04-06: qty 4000

## 2019-04-06 MED ORDER — HYDRALAZINE HCL 50 MG PO TABS
50.0000 mg | ORAL_TABLET | Freq: Three times a day (TID) | ORAL | Status: DC
  Administered 2019-04-07 – 2019-04-10 (×11): 50 mg via ORAL
  Filled 2019-04-06 (×11): qty 1

## 2019-04-06 NOTE — H&P (Addendum)
Cardiology Consult    Patient ID: EDGARD [name] MRN: [id number], DOB/AGE: Sep 06, 1956   Admit date: 04/06/2019 Date of Consult: 04/06/2019  Primary Physician: [name], MD Primary Cardiologist: No primary care provider on file.    Past Medical History   Past Medical History:  Diagnosis Date  . Anemia   . Chronic combined systolic and diastolic CHF (congestive heart failure) ([place])    a. 07/2015 Echo: EF 45-50%, mod LVH, mid apical/antsept AK, Gr 1 DD.  [name] CKD (chronic kidney disease), stage III ([name])   . Coronary artery disease    a. 2012 s/p MI/PCI: s/p DES to mid/dist RCA and overlapping DES to LAD;  b. 07/2015 Cath: LAD 10% ISR, D1 40(jailed), LCX 13m, OM2 30, OM3 30, RCA 35m ISR, 10d ISR; c. 07/2016 NSTEMI/PCI: LAD 85ost/p/m (3.5x20 Synergy DES overlapping prior stent), D1 40, LCX 41m, OM2 95 (2.75x16 Synergy DES), OM3 30, RCA 35m, 10d.  . Depression   . Diabetic gastroparesis (Joshua [name])   . DKA (diabetic ketoacidoses) (Joshua [name]) 03/14/2018  . GERD (gastroesophageal reflux disease)   . Gout   . Gout   . Heart murmur   . Hyperlipemia   . Hypertensive heart disease   . Ischemic cardiomyopathy    a. 07/2015 Echo: EF 45-50%, mod LVH, mid-apicalanteroseptal AK, Gr1 DD.  . Morbid obesity (Joshua [name])   . Myocardial infarction (Joshua [name]) 2012  . OSA on CPAP   . Osteomyelitis ([name])    a. 07/2016 MTP joint of R great toe s/p transmetatarsal amputation.  . Polyneuropathy in diabetes(357.2)   . Pulmonary sarcoidosis ([name])    "problems w/it years ago; no problems anymore" (07/03/2014)  . Rectal bleeding   . Type II diabetes mellitus (Joshua [name])     Past Surgical History:  Procedure Laterality Date  . AMPUTATION Right 07/24/2016   Procedure: TRANSMETATARSAL FOOT AMPUTATION;  Surgeon: [name], MD;  Location: [place];  Service: Orthopedics;  Laterality: Right;  . BALLOON DILATION N/A 03/21/2018   Procedure: BALLOON DILATION;  Surgeon: [name], MD;  Location: [name] ENDOSCOPY;  Service: Endoscopy;   Laterality: N/A;  . BREAST LUMPECTOMY Right ~ 2000   "benign"  . CARDIAC CATHETERIZATION N/A 07/30/2015   Procedure: Left Heart Cath and Coronary Angiography;  Surgeon: Burnell Blanks, MD;  Location: [place] CV LAB;  Service: Cardiovascular;  Laterality: N/A;  . CARDIAC CATHETERIZATION N/A 07/19/2016   Procedure: Left Heart Cath and Coronary Angiography;  Surgeon: Belva Crome, MD;  Location: [place] CV LAB;  Service: Cardiovascular;  Laterality: N/A;  . CARDIAC CATHETERIZATION N/A 07/29/2016   Procedure: Coronary Stent Intervention;  Surgeon: Belva Crome, MD;  Location: [place] CV LAB;  Service: Cardiovascular;  Laterality: N/A;  . CARDIAC CATHETERIZATION N/A 07/29/2016   Procedure: Intravascular Ultrasound/IVUS;  Surgeon: Belva Crome, MD;  Location: [place] CV LAB;  Service: Cardiovascular;  Laterality: N/A;  . CATARACT EXTRACTION W/ INTRAOCULAR LENS  IMPLANT, BILATERAL Bilateral 2014-2015  . COLONOSCOPY WITH PROPOFOL N/A 08/22/2017   Procedure: COLONOSCOPY WITH PROPOFOL;  Surgeon: [name], MD;  Location: [place];  Service: Endoscopy;  Laterality: N/A;  . CORONARY ANGIOPLASTY WITH STENT PLACEMENT  10/31/2011   30 % MID ATRIOVENTRICULAR GROOVE CX STENOSIS. 90 % MID RCA.PTA TO LAD/STENTING OF TANDEM 60-70%. LAD STENOSIS 99% REDUCED TO 0%.3.0 X 32MM PROMUS DES POSTDILATED TO 3.25MM.  [name] CORONARY ANGIOPLASTY WITH STENT PLACEMENT  07/03/2014   "2"  . ESOPHAGOGASTRODUODENOSCOPY (EGD) WITH PROPOFOL N/A 03/21/2018   Procedure: ESOPHAGOGASTRODUODENOSCOPY (  EGD) WITH PROPOFOL;  Surgeon: [name], MD;  Location: [place];  Service: Endoscopy;  Laterality: N/A;  . I&D EXTREMITY Right 10/30/2013   Procedure: IRRIGATION AND DEBRIDEMENT RIGHT SMALL FINGER POSSIBLE SECONDARY WOUND CLOSURE;  Surgeon: [name], MD;  Location: [facility];  Service: Orthopedics;  Laterality: Right;  [name] is available after 4pm  . I&D EXTREMITY Right 11/01/2013   Procedure:   IRRIGATION AND DEBRIDEMENT RIGHT  PROXIMAL PHALANGEAL LEVEL AMPUTATION;  Surgeon: [name], MD;  Location: [facility];  Service: Orthopedics;  Laterality: Right;  . INCISION AND DRAINAGE Right 10/28/2013   Procedure: INCISION AND DRAINAGE Right small finger;  Surgeon: [name], MD;  Location: [facility];  Service: Orthopedics;  Laterality: Right;  . LEFT HEART CATH Left 11/03/2011   Procedure: LEFT HEART CATH;  Surgeon: [name], MD;  Location: [facility] CATH LAB;  Service: Cardiovascular;  Laterality: Left;  . LEFT HEART CATHETERIZATION WITH CORONARY ANGIOGRAM N/A 07/03/2014   Procedure: LEFT HEART CATHETERIZATION WITH CORONARY ANGIOGRAM;  Surgeon: [name], MD;  Location: [facility] CATH LAB;  Service: Cardiovascular;  Laterality: N/A;  . PERCUTANEOUS CORONARY STENT INTERVENTION (PCI-S) Left 11/03/2011   Procedure: PERCUTANEOUS CORONARY STENT INTERVENTION (PCI-S);  Surgeon: [name], MD;  Location: [facility] CATH LAB;  Service: Cardiovascular;  Laterality: Left;  . TOE AMPUTATION Right ~ 2010   "2nd toe"  . TOE AMPUTATION Right 2012   "3rd toe"     Allergies  Allergies  Allergen Reactions  . Chlorthalidone Other (See Comments)    Causes dehydration, kidneys shut down    History of Present Illness    Joshua [name] is a 63 y.o. male with PMH ofCAD, CKD stage III, DM 2 with history of DKA, HTN, HLD,OSAon CPAP and history of pulmonary sarcoidosis. Present to [facility] with 2 week history of ongoing intermittent CP with bad episode of CP 2 days ago. He has been running out of meds and has been stretching all his meds. Has more SOB with mild activity. Also with worse LEE. Bs running high 200-400. \ Trop elevated beyond levels observed before. BNP low. Recent history presented below:   He had anterior lateral STEMI in November 2012 requiring 3.0 x 32 mm Promus DES placed in the LAD, he subsequently had staged PCI of mid RCA with another drug-eluting stent. He had another cardiac  catheterization in August 2016, LAD stent was patent, there was 40% D1 lesion, 60% lesion in left circumflex artery, 50% in-stent restenosis in the RCA. Echocardiogram obtained at the time showed EF 45 to 50% with moderate LVH, grade 1 DD. He was placed on Protonix for management of GERD. He was readmitted in August 2017 with NSTEMI, repeat cardiac catheterization showed new proximal LAD lesion and 95% OM1 lesion. He underwent another stenting of LAD and left circumflex vessel with synergy stents. He was readmitted in October 2017 with atypical chest pain. Myoview at the time showed EF 39% without ischemia. Patient was last seen by Dr. Claiborne Billings in December 2018, at which time he denies any further anginal symptoms. Since the last office visit, patient was admitted with DKA and severe dehydration in the setting of uncontrolled type II DM in April 2019. On arrival his creatinine was 5.25, CBG of 1241, aniongap was over 35. Hemoglobin A1c was greater than 15.5.   Seen again in December 2019, he was complaining of exertional chest discomfort for 2 months. Cardiac cath deferred due ot renal disease. Creat 2.2. Lexiscan  stress done to risk stratify.  Imdur increased.  Myoview was obtained on 12/26/2018 which showed EF 33%, medium sized moderate intensity severe fixed septal and anteroseptal perfusion defect which could represent scar or artifact, no significant reversible ischemia noted.  Overall this is a high risk study due to decreased LVEF which has been reduced from the previous study in 2017.  Inpatient Medications      Family History    Family History  Problem Relation Age of Onset  . Heart attack Maternal Aunt   . Diabetes Maternal Grandmother   . Hypertension Maternal Grandmother    He indicated that his mother is deceased. He indicated that his father is deceased. He indicated that his maternal grandmother is deceased. He indicated that his maternal grandfather is deceased. He indicated  that his paternal grandmother is deceased. He indicated that his paternal grandfather is deceased. He indicated that the status of his maternal aunt is unknown.   Social History    Social History   Socioeconomic History  . Marital status: Married    Spouse name: Not on file  . Number of children: Not on file  . Years of education: Not on file  . Highest education level: Not on file  Occupational History  . Not on file  Social Needs  . Financial resource strain: Not on file  . Food insecurity:    Worry: Not on file    Inability: Not on file  . Transportation needs:    Medical: Not on file    Non-medical: Not on file  Tobacco Use  . Smoking status: Never Smoker  . Smokeless tobacco: Never Used  Substance and Sexual Activity  . Alcohol use: Yes    Comment: social   . Drug use: No  . Sexual activity: Yes  Lifestyle  . Physical activity:    Days per week: Not on file    Minutes per session: Not on file  . Stress: Not on file  Relationships  . Social connections:    Talks on phone: Not on file    Gets together: Not on file    Attends religious service: Not on file    Active member of club or organization: Not on file    Attends meetings of clubs or organizations: Not on file    Relationship status: Not on file  . Intimate partner violence:    Fear of current or ex partner: Not on file    Emotionally abused: Not on file    Physically abused: Not on file    Forced sexual activity: Not on file  Other Topics Concern  . Not on file  Social History Narrative  . Not on file     Review of Systems    General:  No chills, fever, night sweats or weight changes.  Cardiovascular:  No chest pain, dyspnea on exertion, edema, orthopnea, palpitations, paroxysmal nocturnal dyspnea. Dermatological: No rash, lesions/masses Respiratory: No cough, dyspnea Urologic: No hematuria, dysuria Abdominal:   No nausea, vomiting, diarrhea, bright red blood per rectum, melena, or hematemesis  Neurologic:  No visual changes, wkns, changes in mental status. All other systems reviewed and are otherwise negative except as noted above.  Physical Exam    Blood pressure (!) 147/83, pulse 74, temperature 98.9 F (37.2 C), temperature source Oral, resp. rate 20, SpO2 98 %.  General: Pleasant, NAD Psych: Normal affect. Neuro: Alert and oriented X 3. Moves all extremities spontaneously. HEENT: Normal  Neck: JVD Lungs:  Resp regular and  unlabored, CTA. Heart: RRR no s3, s4, or murmurs. Abdomen: Soft, non-tender, non-distended, BS + x 4.  Extremities: 2+ edema  Labs    Troponin (Point of Care Test) No results for input(s): TROPIPOC in the last 72 hours. Recent Labs    04/06/19 1857  TROPONINI 0.47*   Lab Results  Component Value Date   WBC 7.3 04/06/2019   HGB 12.2 (L) 04/06/2019   HCT 38.3 (L) 04/06/2019   MCV 86.8 04/06/2019   PLT 186 04/06/2019    Recent Labs  Lab 04/06/19 1857  NA 134*  K 3.9  CL 102  CO2 24  BUN 36*  CREATININE 2.01*  CALCIUM 8.7*  PROT 7.4  BILITOT 0.5  ALKPHOS 107  ALT 16  AST 21  GLUCOSE 191*   Lab Results  Component Value Date   CHOL 158 11/17/2016   HDL 74 11/17/2016   LDLCALC 61 11/17/2016   TRIG 116 11/17/2016   No results found for: [facility]   Radiology Studies    Dg Chest 2 View  Result Date: 04/06/2019 CLINICAL DATA:  63 year old male with history of shortness of breath for week. Bilateral leg swelling. EXAM: CHEST - 2 VIEW COMPARISON:  Chest x-ray 03/14/2018. FINDINGS: Scattered linear opacities throughout the mid lungs bilaterally, similar to prior examinations, most compatible with mild scarring. Lung volumes are normal. No consolidative airspace disease. No pleural effusions. No pneumothorax. No pulmonary nodule or mass noted. Pulmonary vasculature and the cardiomediastinal silhouette are within normal limits. IMPRESSION: No radiographic evidence of acute cardiopulmonary disease. Electronically Signed   By: Vinnie Langton M.D.   On: 04/06/2019 18:52    ECG & Cardiac Imaging    NSR, no acute ischemia - personally reviewed.  Assessment & Plan    NSTEMI and Acute on chronic systolic HF: No CP currently fluid up. DM, HTN, CKD known, CAD with last stent in 2016. Compliance issues with meds over last few weeks.   Plan: - Admit to cardiology - Heparin drip - Restart home meds including aspirin and ticagrelor, metop and ISMN - Rest insulin regimen  - Lasix iv $Remove'40mg'[id number]$  BID - Tentative LHC on Monday   Signed, Cristina Gong, MD 04/06/2019, 9:21 PM  For questions or updates, please contact   Please consult [email or web address] for contact info under Cardiology/STEMI.

## 2019-04-06 NOTE — Progress Notes (Signed)
ANTICOAGULATION CONSULT NOTE - Initial Consult  Pharmacy Consult for Heparin Indication: chest pain/ACS  Allergies  Allergen Reactions  . Chlorthalidone Other (See Comments)    Causes dehydration, kidneys shut down    Patient Measurements: Height: 5\' 9"  (175.3 cm) Weight: (!) 312 lb (141.5 kg) IBW/kg (Calculated) : 70.7 Heparin Dosing Weight:   Vital Signs: Temp: 98.9 F (37.2 C) (04/24 1825) Temp Source: Oral (04/24 1825) BP: 150/87 (04/24 2132) Pulse Rate: 73 (04/24 2132)  Labs: Recent Labs    04/06/19 1857  HGB 12.2*  HCT 38.3*  PLT 186  CREATININE 2.01*  TROPONINI 0.47*    Estimated Creatinine Clearance: 54.7 mL/min (A) (by C-G formula based on SCr of 2.01 mg/dL (H)).   Medical History: Past Medical History:  Diagnosis Date  . Anemia   . Chronic combined systolic and diastolic CHF (congestive heart failure) ([place])    a. 07/2015 Echo: EF 45-50%, mod LVH, mid apical/antsept AK, Gr 1 DD.  [name] CKD (chronic kidney disease), stage III ([place])   . Coronary artery disease    a. 2012 s/p MI/PCI: s/p DES to mid/dist RCA and overlapping DES to LAD;  b. 07/2015 Cath: LAD 10% ISR, D1 40(jailed), LCX 69m, OM2 30, OM3 30, RCA 62m ISR, 10d ISR; c. 07/2016 NSTEMI/PCI: LAD 85ost/p/m (3.5x20 Synergy DES overlapping prior stent), D1 40, LCX 76m, OM2 95 (2.75x16 Synergy DES), OM3 30, RCA 16m, 10d.  . Depression   . Diabetic gastroparesis ([place])   . DKA (diabetic ketoacidoses) ([place]) 03/14/2018  . GERD (gastroesophageal reflux disease)   . Gout   . Gout   . Heart murmur   . Hyperlipemia   . Hypertensive heart disease   . Ischemic cardiomyopathy    a. 07/2015 Echo: EF 45-50%, mod LVH, mid-apicalanteroseptal AK, Gr1 DD.  . Morbid obesity ([place])   . Myocardial infarction ([place]) 2012  . OSA on CPAP   . Osteomyelitis ([place])    a. 07/2016 MTP joint of R great toe s/p transmetatarsal amputation.  . Polyneuropathy in diabetes(357.2)   . Pulmonary sarcoidosis ([place])    "problems w/it years ago;  no problems anymore" (07/03/2014)  . Rectal bleeding   . Type II diabetes mellitus ([facility])     Medications:  Infusions:  . heparin      Assessment: Patient with ACS.  No oral anticoagulants noted on med rec. Baseline coags ordered/drawn.  Goal of Therapy:  Heparin level 0.3-0.7 units/ml Monitor platelets by anticoagulation protocol: Yes   Plan:  Heparin bolus 4000 units iv x1 Heparin drip at 1200 units/hr Daily CBC Next heparin level at 0600    Tyler Deis, Shea Stakes [place] 04/06/2019,9:33 PM

## 2019-04-06 NOTE — ED Notes (Signed)
CRITICAL VALUE STICKER  CRITICAL VALUE: Troponin 0.47  DATE & TIME NOTIFIED: 04/06/19 2018  MD NOTIFIED: Melina Copa  TIME OF NOTIFICATION: 2018

## 2019-04-06 NOTE — Progress Notes (Signed)
Admitted pt from [place] ED per stretcher with Heparin IV drip going on assisted by the [facility] team and Dr. Raiford Simmonds. Pt is alert, oriented x 4, denies chest pain, denies nausea and vomiting, not in respiratory distress. Oriented to room and call bell. Placed on the cardiac monitor and verified by another RN.    04/06/19 2342  Vitals  Temp 98.5 F (36.9 C)  Temp Source Oral  BP (!) 149/84  BP Location Left Arm  BP Method Automatic  Patient Position (if appropriate) Sitting  Resp 16  Oxygen Therapy  SpO2 96 %  O2 Device Room Air  Pain Assessment  Pain Score 0  Height and Weight  Height $Remov'5\' 9"'TUlBLm$  (1.753 m)  Weight (!) 140.8 kg  Type of Scale Used Standing  Type of Weight Actual  BSA (Calculated - sq m) 2.62 sq meters  BMI (Calculated) 45.82  Weight in (lb) to have BMI = 25 168.9  MEWS Score  MEWS RR 0  MEWS Pulse 0  MEWS Systolic 0  MEWS LOC 0  MEWS Temp 0  MEWS Score 0  MEWS Score Color Green

## 2019-04-06 NOTE — ED Notes (Signed)
Labs drawn extra sent down  2 gold  2 lav  1 light green  1 dark green  1 blue

## 2019-04-06 NOTE — ED Triage Notes (Signed)
Pt reports for week or so been SOB and having bilat leg swelling. Reports 2 nights ago had chest pains and took Nitro but none since. Hx MI and stents. Also having lower back pains for more than month

## 2019-04-06 NOTE — ED Provider Notes (Signed)
[place] DEPT Provider Note   CSN: [id number] Arrival date & time: 04/06/19  1755    History   Chief Complaint Chief Complaint  Patient presents with  . Shortness of Breath  . Leg Swelling    HPI Joshua [name] is a 63 y.o. male.  He has multiple medical problems including CKD CHF coronary artery disease.  He is complaining of 2 weeks of increased shortness of breath PND and leg swelling.  He is also had intermittent chest discomfort that is becoming more frequent and is taking nitro for it.  He said the worst episode was 2 nights ago and the nitro did not relieve it.  Currently not having any chest pain.  No known fevers.  Minimal cough.  He has had some nasal congestion.  No vomiting or diarrhea no urinary symptoms no rashes.  He has been taking his medicine but he said due to of lack of funds he has been taking some things just once a day instead of twice scheduled.  He had a virtual visit today and they recommended he come in for an evaluation.     The history is provided by the patient.  Shortness of Breath  Severity:  Moderate Onset quality:  Gradual Duration:  2 weeks Timing:  Intermittent Progression:  Worsening Chronicity:  New Context: activity   Relieved by:  Nothing Worsened by:  Activity and exertion Ineffective treatments:  Position changes Associated symptoms: chest pain and PND   Associated symptoms: no abdominal pain, no cough, no diaphoresis, no fever, no headaches, no hemoptysis, no neck pain, no rash, no sore throat, no sputum production, no syncope, no vomiting and no wheezing     Past Medical History:  Diagnosis Date  . Anemia   . Chronic combined systolic and diastolic CHF (congestive heart failure) ([place])    a. 07/2015 Echo: EF 45-50%, mod LVH, mid apical/antsept AK, Gr 1 DD.  [name] CKD (chronic kidney disease), stage III ([place])   . Coronary artery disease    a. 2012 s/p MI/PCI: s/p DES to mid/dist RCA and overlapping DES  to LAD;  b. 07/2015 Cath: LAD 10% ISR, D1 40(jailed), LCX 41m OM2 30, OM3 30, RCA 579mSR, 10d ISR; c. 07/2016 NSTEMI/PCI: LAD 85ost/p/m (3.5x20 Synergy DES overlapping prior stent), D1 40, LCX 7557mM2 95 (2.75x16 Synergy DES), OM3 30, RCA 94m35md.  . Depression   . Diabetic gastroparesis (HCC)[place]. DKA (diabetic ketoacidoses) (HCC)[place]/01/2018  . GERD (gastroesophageal reflux disease)   . Gout   . Gout   . Heart murmur   . Hyperlipemia   . Hypertensive heart disease   . Ischemic cardiomyopathy    a. 07/2015 Echo: EF 45-50%, mod LVH, mid-apicalanteroseptal AK, Gr1 DD.  . Morbid obesity (HCC)[place]. Myocardial infarction (HCC)[place]12  . OSA on CPAP   . Osteomyelitis (HCC)[place] a. 07/2016 MTP joint of R great toe s/p transmetatarsal amputation.  . Polyneuropathy in diabetes(357.2)   . Pulmonary sarcoidosis (HCC)[place] "problems w/it years ago; no problems anymore" (07/03/2014)  . Rectal bleeding   . [facility]  Patient Active Problem List   Diagnosis Date Noted  . Sepsis (HCC)[place]/02/2018  . DKA (diabetic ketoacidoses) (HCC)[place]/02/2018  . Special screening for malignant neoplasms, colon 08/22/2017  . Acute renal failure superimposed on stage 3 chronic kidney disease (HCC)[place]/06/2017  . Essential hypertension   .  Status post transmetatarsal amputation of foot, right ([place]) 11/08/2016  . Diabetic polyneuropathy associated with type 2 diabetes mellitus ([place]) 11/08/2016  . Pure hypercholesterolemia   . AKI (acute kidney injury) ([place])   . Acute blood loss anemia   . DM type 2 with diabetic peripheral neuropathy ([place])   . Physical debility 08/03/2016  . Chronic osteomyelitis of right foot ([place])   . Cellulitis and abscess of leg, except foot   . Penetrating wound of right foot   . CKD (chronic kidney disease) stage 3, GFR 30-59 ml/min (HCC)   . Unstable angina pectoris ([place]) 07/18/2016  . Chronic combined systolic and diastolic congestive heart failure ([place])   . Chest pain  with moderate risk for cardiac etiology 06/24/2014  . CAD S/P multiple PCIs 06/24/2014  . Sleep apnea- on C-pap 06/24/2014  . Morbid obesity due to excess calories ([place]) 11/10/2013  . Hyperglycemia 10/29/2013  . Hyperlipidemia 01/12/2011  . Benign essential HTN 01/12/2011  . GERD 01/12/2011    Past Surgical History:  Procedure Laterality Date  . AMPUTATION Right 07/24/2016   Procedure: TRANSMETATARSAL FOOT AMPUTATION;  Surgeon: [name], MD;  Location: [place];  Service: Orthopedics;  Laterality: Right;  . BALLOON DILATION N/A 03/21/2018   Procedure: BALLOON DILATION;  Surgeon: [name], MD;  Location[facility] ENDOSCOPY;  Service: Endoscopy;  Laterality: N/A;  . BREAST LUMPECTOMY Right ~ 2000   "benign"  . CARDIAC CATHETERIZATION N/A 07/30/2015   Procedure: Left Heart Cath and Coronary Angiography;  Surgeon: Burnell Blanks, MD;  Location: [place] CV LAB;  Service: Cardiovascular;  Laterality: N/A;  . CARDIAC CATHETERIZATION N/A 07/19/2016   Procedure: Left Heart Cath and Coronary Angiography;  Surgeon: Belva Crome, MD;  Location: [place] CV LAB;  Service: Cardiovascular;  Laterality: N/A;  . CARDIAC CATHETERIZATION N/A 07/29/2016   Procedure: Coronary Stent Intervention;  Surgeon: Belva Crome, MD;  Location: [place] CV LAB;  Service: Cardiovascular;  Laterality: N/A;  . CARDIAC CATHETERIZATION N/A 07/29/2016   Procedure: Intravascular Ultrasound/IVUS;  Surgeon: Belva Crome, MD;  Location: [place] CV LAB;  Service: Cardiovascular;  Laterality: N/A;  . CATARACT EXTRACTION W/ INTRAOCULAR LENS  IMPLANT, BILATERAL Bilateral 2014-2015  . COLONOSCOPY WITH PROPOFOL N/A 08/22/2017   Procedure: COLONOSCOPY WITH PROPOFOL;  Surgeon: [name], MD;  Location: [place];  Service: Endoscopy;  Laterality: N/A;  . CORONARY ANGIOPLASTY WITH STENT PLACEMENT  10/31/2011   30 % MID ATRIOVENTRICULAR GROOVE CX STENOSIS. 90 % MID RCA.PTA TO LAD/STENTING OF TANDEM 60-70%.  LAD STENOSIS 99% REDUCED TO 0%.3.0 X 32MM PROMUS DES POSTDILATED TO 3.25MM.  [name] CORONARY ANGIOPLASTY WITH STENT PLACEMENT  07/03/2014   "2"  . ESOPHAGOGASTRODUODENOSCOPY (EGD) WITH PROPOFOL N/A 03/21/2018   Procedure: ESOPHAGOGASTRODUODENOSCOPY (EGD) WITH PROPOFOL;  Surgeon: [name], MD;  Location: [place];  Service: Endoscopy;  Laterality: N/A;  . I&D EXTREMITY Right 10/30/2013   Procedure: IRRIGATION AND DEBRIDEMENT RIGHT SMALL FINGER POSSIBLE SECONDARY WOUND CLOSURE;  Surgeon: [name], MD;  Location: [facility] ORS;  Service: Orthopedics;  Laterality: Right;  [name] is available after 4pm  . I&D EXTREMITY Right 11/01/2013   Procedure:  IRRIGATION AND DEBRIDEMENT RIGHT  PROXIMAL PHALANGEAL LEVEL AMPUTATION;  Surgeon: [name], MD;  Location: [facility] ORS;  Service: Orthopedics;  Laterality: Right;  . INCISION AND DRAINAGE Right 10/28/2013   Procedure: INCISION AND DRAINAGE Right small finger;  Surgeon: [name], MD;  Location: [facility] ORS;  Service: Orthopedics;  Laterality: Right;  .  LEFT HEART CATH Left 11/03/2011   Procedure: LEFT HEART CATH;  Surgeon: [name], MD;  Location: [facility] CATH LAB;  Service: Cardiovascular;  Laterality: Left;  . LEFT HEART CATHETERIZATION WITH CORONARY ANGIOGRAM N/A 07/03/2014   Procedure: LEFT HEART CATHETERIZATION WITH CORONARY ANGIOGRAM;  Surgeon: [name], MD;  Location: [facility] CATH LAB;  Service: Cardiovascular;  Laterality: N/A;  . PERCUTANEOUS CORONARY STENT INTERVENTION (PCI-S) Left 11/03/2011   Procedure: PERCUTANEOUS CORONARY STENT INTERVENTION (PCI-S);  Surgeon: [name], MD;  Location: [facility] CATH LAB;  Service: Cardiovascular;  Laterality: Left;  . TOE AMPUTATION Right ~ 2010   "2nd toe"  . TOE AMPUTATION Right 2012   "3rd toe"        Home Medications    Prior to Admission medications   Medication Sig Start Date End Date Taking? Authorizing Provider  acetaminophen (TYLENOL) 650 MG CR tablet Take 1,300 mg by  mouth daily as needed for pain.    [provider]  allopurinol (ZYLOPRIM) 300 MG tablet Take 300 mg by mouth daily.     [provider]  amLODipine (NORVASC) 10 MG tablet Take 1 tablet (10 mg total) by mouth daily. HOLD until follow -up with your doctor (see instructions) 01/12/19   Almyra Deforest, PA  aspirin EC 81 MG tablet Take 81 mg by mouth daily.     [provider]  blood glucose meter kit and supplies KIT Dispense based on patient and insurance preference. Use up to four times daily as directed. (FOR ICD-9 250.00, 250.01).  RELION glucometer 03/22/18   Rai, Ripudeep K, MD  Camphor-Menthol-Methyl Sal (HM SALONPAS PAIN RELIEF EX) Apply 1 spray topically daily as needed (pain).    [provider]  furosemide (LASIX) 40 MG tablet Take 1 tablet (40 mg total) by mouth daily. 01/12/19   Almyra Deforest, PA  hydrALAZINE (APRESOLINE) 50 MG tablet Take 1 tablet (50 mg total) by mouth 3 (three) times daily. 01/12/19   Almyra Deforest, PA  insulin NPH Human (HUMULIN N,NOVOLIN N) 100 UNIT/ML injection Inject 0.22 mLs (22 Units total) into the skin 2 (two) times daily before a meal. Please finish this insulin vial at home 03/22/18   Rai, Ripudeep K, MD  insulin NPH-regular Human (NOVOLIN 70/30 RELION) (70-30) 100 UNIT/ML injection Inject 34 Units into the skin 2 (two) times daily with a meal. Please fill this prescription only when you have finished your insulin at home Patient taking differently: Inject 22-34 Units into the skin See admin instructions. Inject 34 units twice daily unless blood sugar is under 100 inject only 22 units 03/22/18   Rai, Ripudeep K, MD  insulin regular (NOVOLIN R,HUMULIN R) 100 units/mL injection Sliding scale  CBG 70 - 120: 0 units: CBG 121 - 150: 2 units; CBG 151 - 200: 3 units; CBG 201 - 250: 5 units; CBG 251 - 300: 8 units;CBG 301 - 350: 11 units; CBG 351 - 400: 15 units; CBG > 400 : 15 units and notify your doctor  Please finish the insulin vial at home  Patient taking differently: Inject 2-15 Units into the skin 3 (three) times daily with meals. Sliding scale  CBG 70 - 120: 0 units: CBG 121 - 150: 2 units; CBG 151 - 200: 3 units; CBG 201 - 250: 5 units; CBG 251 - 300: 8 units;CBG 301 - 350: 11 units; CBG 351 - 400: 15 units; CBG > 400 : 15 units and notify your doctor  Please finish the insulin  vial at home 03/22/18   Rai, Vernelle Emerald, MD  isosorbide mononitrate (IMDUR) 120 MG 24 hr tablet Take 1 tablet (120 mg total) by mouth 2 (two) times daily. HOLD until follow -up with your doctor (see instructions) 01/12/19   Almyra Deforest, PA  metoCLOPramide (REGLAN) 10 MG tablet Take 1 tablet (10 mg total) by mouth every 8 (eight) hours as needed for nausea. 03/22/18   Rai, Vernelle Emerald, MD  metoprolol succinate (TOPROL XL) 100 MG 24 hr tablet Take 1 and 1/2 tablets by mouth daily. 01/12/19   Almyra Deforest, PA  nitroGLYCERIN (NITROSTAT) 0.4 MG SL tablet Place 1 tablet (0.4 mg total) under the tongue every 5 (five) minutes as needed for chest pain. 01/12/19   Almyra Deforest, PA  pantoprazole (PROTONIX) 40 MG tablet Take 1 tablet (40 mg total) by mouth 2 (two) times daily before a meal. 01/12/19   Almyra Deforest, PA  rosuvastatin (CRESTOR) 40 MG tablet Take 1 tablet (40 mg total) by mouth daily. 01/12/19   Almyra Deforest, PA  ticagrelor (BRILINTA) 90 MG TABS tablet Take 1 tablet (90 mg total) by mouth 2 (two) times daily. 01/12/19   Almyra Deforest, PA    Family History Family History  Problem Relation Age of Onset  . Heart attack Maternal Aunt   . Diabetes Maternal Grandmother   . Hypertension Maternal Grandmother     Social History Social History   Tobacco Use  . Smoking status: Never Smoker  . Smokeless tobacco: Never Used  Substance Use Topics  . Alcohol use: Yes    Comment: social   . Drug use: No     Allergies   Chlorthalidone   Review of Systems Review of Systems  Constitutional: Negative for diaphoresis and fever.  HENT: Negative for sore throat.   Eyes: Negative  for visual disturbance.  Respiratory: Positive for shortness of breath. Negative for cough, hemoptysis, sputum production and wheezing.   Cardiovascular: Positive for chest pain, leg swelling and PND. Negative for syncope.  Gastrointestinal: Positive for nausea. Negative for abdominal pain and vomiting.  Genitourinary: Negative for dysuria.  Musculoskeletal: Negative for neck pain.  Skin: Negative for rash.  Neurological: Negative for headaches.     Physical Exam Updated Vital Signs BP (!) 165/99 (BP Location: Left Arm)   Pulse 73   Temp 98.9 F (37.2 C) (Oral)   Resp 18   SpO2 99%   Physical Exam Vitals signs and nursing note reviewed.  Constitutional:      Appearance: He is well-developed.  HENT:     Head: Normocephalic and atraumatic.  Eyes:     Conjunctiva/sclera: Conjunctivae normal.  Neck:     Musculoskeletal: Neck supple.  Cardiovascular:     Rate and Rhythm: Normal rate and regular rhythm.     Heart sounds: No murmur.  Pulmonary:     Effort: Pulmonary effort is normal. No respiratory distress.     Breath sounds: Normal breath sounds.  Abdominal:     Palpations: Abdomen is soft.     Tenderness: There is no abdominal tenderness.  Musculoskeletal: Normal range of motion.     Right lower leg: He exhibits no tenderness. Edema present.     Left lower leg: He exhibits no tenderness. Edema present.  Skin:    General: Skin is warm and dry.     Capillary Refill: Capillary refill takes less than 2 seconds.  Neurological:     General: No focal deficit present.     Mental Status: He  is alert and oriented to person, place, and time.      ED Treatments / Results  Labs (all labs ordered are listed, but only abnormal results are displayed) Labs Reviewed  COMPREHENSIVE METABOLIC PANEL  TROPONIN I  BRAIN NATRIURETIC PEPTIDE  CBC WITH DIFFERENTIAL/PLATELET    EKG EKG Interpretation  Date/Time:  Friday April 06 2019 18:11:44 EDT Ventricular Rate:  72 PR Interval:     QRS Duration: 83 QT Interval:  414 QTC Calculation: 454 R Axis:   31 Text Interpretation:  Sinus rhythm Prolonged PR interval Anterior infarct, old Baseline wander in lead(s) II III aVF improved ischemic changes from prior 4/19 Confirmed by Aletta Edouard [phone]) on 04/06/2019 6:15:54 PM   Radiology Dg Chest 2 View  Result Date: 04/06/2019 CLINICAL DATA:  63 year old male with history of shortness of breath for week. Bilateral leg swelling. EXAM: CHEST - 2 VIEW COMPARISON:  Chest x-ray 03/14/2018. FINDINGS: Scattered linear opacities throughout the mid lungs bilaterally, similar to prior examinations, most compatible with mild scarring. Lung volumes are normal. No consolidative airspace disease. No pleural effusions. No pneumothorax. No pulmonary nodule or mass noted. Pulmonary vasculature and the cardiomediastinal silhouette are within normal limits. IMPRESSION: No radiographic evidence of acute cardiopulmonary disease. Electronically Signed   By: Vinnie Langton M.D.   On: 04/06/2019 18:52    Procedures .Critical Care Performed by: Hayden Rasmussen, MD Authorized by: Hayden Rasmussen, MD   Critical care provider statement:    Critical care time (minutes):  45   Critical care time was exclusive of:  Separately billable procedures and treating other patients   Critical care was necessary to treat or prevent imminent or life-threatening deterioration of the following conditions:  Cardiac failure   Critical care was time spent personally by me on the following activities:  Discussions with consultants, evaluation of patient's response to treatment, examination of patient, ordering and performing treatments and interventions, ordering and review of laboratory studies, ordering and review of radiographic studies, pulse oximetry, re-evaluation of patient's condition, obtaining history from patient or surrogate, review of old charts and development of treatment plan with patient or surrogate    I assumed direction of critical care for this patient from another provider in my specialty: no     (including critical care time)  Medications Ordered in ED Medications - No data to display   Initial Impression / Assessment and Plan / ED Course  I have reviewed the triage vital signs and the nursing notes.  Pertinent labs & imaging results that were available during my care of the patient were reviewed by me and considered in my medical decision making (see chart for details).  Clinical Course as of Apr 05 2116  Fri Apr 06, 2019  1900 Differential diagnosis includes pneumonia, CHF, ACS, worsening CKD, metabolic derangement, anemia.   [MB]  2023 Lexi 1/20- Nuclear stress EF: 33%.  The left ventricular ejection fraction is moderately decreased (30-44%).  No T wave inversion was noted during stress.  There was no ST segment deviation noted during stress.  Defect 1: There is a medium defect of moderate severity.  This is a high risk study.   Medium sized, moderate intensity fixed septal and anteroseptal perfusion defect which could represent scar or artifact. No significant reversible ischemia. LVEF 33% with inferoseptal and septal hypokinesis/dyskinesis. This is a high risk study for decreased LVEF which is reduced from a prior study in 2017.   [MB]  2023 Was informed from the lab that  patient's troponin is elevated 0.47.  His baseline troponins are usually under 0.1.  I have placed a call into cardiology for consult/recommendations   [MB]  2116 Discussed with cardiology fellow [place]ud[place] who asked that we admit the patient to [facility] under Dr. Grayland Ormond service on a heparin drip.  I have put in temporary admit orders over to [facility] on a telemetry bed.  Have updated the patient and answered his questions to the best of my ability.   [MB]    Clinical Course User Index [MB] Hayden Rasmussen, MD   Joshua [name] was evaluated in Emergency Department on 04/06/2019 for the symptoms  described in the history of present illness. He was evaluated in the context of the global COVID-19 pandemic, which necessitated consideration that the patient might be at risk for infection with the SARS-CoV-2 virus that causes COVID-19. Institutional protocols and algorithms that pertain to the evaluation of patients at risk for COVID-19 are in a state of rapid change based on information released by regulatory bodies including the CDC and federal and state organizations. These policies and algorithms were followed during the patient's care in the ED.      Final Clinical Impressions(s) / ED Diagnoses   Final diagnoses:  NSTEMI (non-ST elevated myocardial infarction) [facility])    ED Discharge Orders    None       Hayden Rasmussen, MD 04/07/19 1004

## 2019-04-06 NOTE — ED Notes (Signed)
[place]  Paper work ready

## 2019-04-06 NOTE — ED Notes (Signed)
Called [facility] spoke w donna, -   Try to report, nurse not ready will call back

## 2019-04-06 NOTE — ED Notes (Signed)
Pt denies chest pain currently  Pt denies SOB currently

## 2019-04-06 NOTE — ED Notes (Signed)
ED TO INPATIENT HANDOFF REPORT  ED Nurse Name and Phone #: Laurelyn Sickle [phone]  S Name/Age/Gender Joshua [name] 63 y.o. male Room/Bed: WA19/WA19  Code Status   Code Status: Prior  Home/SNF/Other Home Patient oriented to: self, place, time and situation Is this baseline? Yes   Triage Complete: Triage complete  Chief Complaint SOB, Swelling in leg, Lower back pain  Triage Note Pt reports for week or so been SOB and having bilat leg swelling. Reports 2 nights ago had chest pains and took Nitro but none since. Hx MI and stents. Also having lower back pains for more than month   Allergies Allergies  Allergen Reactions  . Chlorthalidone Other (See Comments)    Causes dehydration, kidneys shut down    Level of Care/Admitting Diagnosis ED Disposition    ED Disposition Condition [facility]: [place] [100100]  Level of Care: Telemetry Cardiac [103]  Covid Evaluation: N/A  Diagnosis: NSTEMI (non-ST elevated myocardial infarction) [facility]) [[id number]]  Admitting Physician: Belva Crome [[id number]]  Attending Physician: Belva Crome [[id number]]  Estimated length of stay: past midnight tomorrow  Certification:: I certify this patient will need inpatient services for at least 2 midnights  PT Class (Do Not Modify): Inpatient [101]  PT Acc Code (Do Not Modify): Private [1]       B Medical/Surgery History Past Medical History:  Diagnosis Date  . Anemia   . Chronic combined systolic and diastolic CHF (congestive heart failure) ([place])    a. 07/2015 Echo: EF 45-50%, mod LVH, mid apical/antsept AK, Gr 1 DD.  [name] CKD (chronic kidney disease), stage III ([place])   . Coronary artery disease    a. 2012 s/p MI/PCI: s/p DES to mid/dist RCA and overlapping DES to LAD;  b. 07/2015 Cath: LAD 10% ISR, D1 40(jailed), LCX 80m, OM2 30, OM3 30, RCA 56m ISR, 10d ISR; c. 07/2016 NSTEMI/PCI: LAD 85ost/p/m (3.5x20 Synergy DES overlapping prior stent), D1 40, LCX 26m, OM2 95  (2.75x16 Synergy DES), OM3 30, RCA 52m, 10d.  . Depression   . Diabetic gastroparesis ([place])   . DKA (diabetic ketoacidoses) ([place]) 03/14/2018  . GERD (gastroesophageal reflux disease)   . Gout   . Gout   . Heart murmur   . Hyperlipemia   . Hypertensive heart disease   . Ischemic cardiomyopathy    a. 07/2015 Echo: EF 45-50%, mod LVH, mid-apicalanteroseptal AK, Gr1 DD.  . Morbid obesity ([place])   . Myocardial infarction ([place]) 2012  . OSA on CPAP   . Osteomyelitis ([place])    a. 07/2016 MTP joint of R great toe s/p transmetatarsal amputation.  . Polyneuropathy in diabetes(357.2)   . Pulmonary sarcoidosis ([place])    "problems w/it years ago; no problems anymore" (07/03/2014)  . Rectal bleeding   . Type II diabetes mellitus ([place])    Past Surgical History:  Procedure Laterality Date  . AMPUTATION Right 07/24/2016   Procedure: TRANSMETATARSAL FOOT AMPUTATION;  Surgeon: [name], MD;  Location: [place];  Service: Orthopedics;  Laterality: Right;  . BALLOON DILATION N/A 03/21/2018   Procedure: BALLOON DILATION;  Surgeon: [name], MD;  Location: [facility] ENDOSCOPY;  Service: Endoscopy;  Laterality: N/A;  . BREAST LUMPECTOMY Right ~ 2000   "benign"  . CARDIAC CATHETERIZATION N/A 07/30/2015   Procedure: Left Heart Cath and Coronary Angiography;  Surgeon: Burnell Blanks, MD;  Location: [place] CV LAB;  Service: Cardiovascular;  Laterality: N/A;  . CARDIAC CATHETERIZATION N/A  07/19/2016   Procedure: Left Heart Cath and Coronary Angiography;  Surgeon: Belva Crome, MD;  Location: [facility] CV LAB;  Service: Cardiovascular;  Laterality: N/A;  . CARDIAC CATHETERIZATION N/A 07/29/2016   Procedure: Coronary Stent Intervention;  Surgeon: Belva Crome, MD;  Location: [place] CV LAB;  Service: Cardiovascular;  Laterality: N/A;  . CARDIAC CATHETERIZATION N/A 07/29/2016   Procedure: Intravascular Ultrasound/IVUS;  Surgeon: Belva Crome, MD;  Location: [place] CV LAB;  Service:  Cardiovascular;  Laterality: N/A;  . CATARACT EXTRACTION W/ INTRAOCULAR LENS  IMPLANT, BILATERAL Bilateral 2014-2015  . COLONOSCOPY WITH PROPOFOL N/A 08/22/2017   Procedure: COLONOSCOPY WITH PROPOFOL;  Surgeon: [name], MD;  Location: [place];  Service: Endoscopy;  Laterality: N/A;  . CORONARY ANGIOPLASTY WITH STENT PLACEMENT  10/31/2011   30 % MID ATRIOVENTRICULAR GROOVE CX STENOSIS. 90 % MID RCA.PTA TO LAD/STENTING OF TANDEM 60-70%. LAD STENOSIS 99% REDUCED TO 0%.3.0 X 32MM PROMUS DES POSTDILATED TO 3.25MM.  [name] CORONARY ANGIOPLASTY WITH STENT PLACEMENT  07/03/2014   "2"  . ESOPHAGOGASTRODUODENOSCOPY (EGD) WITH PROPOFOL N/A 03/21/2018   Procedure: ESOPHAGOGASTRODUODENOSCOPY (EGD) WITH PROPOFOL;  Surgeon: [name], MD;  Location: [place];  Service: Endoscopy;  Laterality: N/A;  . I&D EXTREMITY Right 10/30/2013   Procedure: IRRIGATION AND DEBRIDEMENT RIGHT SMALL FINGER POSSIBLE SECONDARY WOUND CLOSURE;  Surgeon: [name], MD;  Location: [facility];  Service: Orthopedics;  Laterality: Right;  [name] is available after 4pm  . I&D EXTREMITY Right 11/01/2013   Procedure:  IRRIGATION AND DEBRIDEMENT RIGHT  PROXIMAL PHALANGEAL LEVEL AMPUTATION;  Surgeon: [name], MD;  Location: [facility];  Service: Orthopedics;  Laterality: Right;  . INCISION AND DRAINAGE Right 10/28/2013   Procedure: INCISION AND DRAINAGE Right small finger;  Surgeon: [name], MD;  Location: [facility];  Service: Orthopedics;  Laterality: Right;  . LEFT HEART CATH Left 11/03/2011   Procedure: LEFT HEART CATH;  Surgeon: [name], MD;  Location: [facility] CATH LAB;  Service: Cardiovascular;  Laterality: Left;  . LEFT HEART CATHETERIZATION WITH CORONARY ANGIOGRAM N/A 07/03/2014   Procedure: LEFT HEART CATHETERIZATION WITH CORONARY ANGIOGRAM;  Surgeon: [name], MD;  Location: [facility] CATH LAB;  Service: Cardiovascular;  Laterality: N/A;  . PERCUTANEOUS CORONARY STENT INTERVENTION (PCI-S) Left  11/03/2011   Procedure: PERCUTANEOUS CORONARY STENT INTERVENTION (PCI-S);  Surgeon: [name], MD;  Location: [facility] CATH LAB;  Service: Cardiovascular;  Laterality: Left;  . TOE AMPUTATION Right ~ 2010   "2nd toe"  . TOE AMPUTATION Right 2012   "3rd toe"     A IV Location/Drains/Wounds Patient Lines/Drains/Airways Status   Active Line/Drains/Airways    Name:   Placement date:   Placement time:   Site:   Days:   Peripheral IV 04/06/19 Right Antecubital   04/06/19    1933    Antecubital   less than 1   Peripheral IV 04/06/19 Left Hand   04/06/19    2129    Hand   less than 1          Intake/Output Last 24 hours No intake or output data in the 24 hours ending 04/06/19 2141  Labs/Imaging Results for orders placed or performed during the hospital encounter of 04/06/19 (from the past 48 hour(s))  Comprehensive metabolic panel     Status: Abnormal   Collection Time: 04/06/19  6:57 PM  Result Value Ref Range   Sodium 134 (L) 135 - 145 mmol/L   Potassium 3.9 3.5 -  5.1 mmol/L   Chloride 102 98 - 111 mmol/L   CO2 24 22 - 32 mmol/L   Glucose, Bld 191 (H) 70 - 99 mg/dL   BUN 36 (H) 6 - 20 mg/dL   Creatinine, Ser 2.01 (H) 0.61 - 1.24 mg/dL   Calcium 8.7 (L) 8.9 - 10.3 mg/dL   Total Protein 7.4 6.5 - 8.1 g/dL   Albumin 3.7 3.5 - 5.0 g/dL   AST 21 15 - 41 U/L   ALT 16 0 - 44 U/L   Alkaline Phosphatase 107 38 - 126 U/L   Total Bilirubin 0.5 0.3 - 1.2 mg/dL   GFR calc non Af Amer 35 (L) >60 mL/min   GFR calc Af Amer 41 (L) >60 mL/min   Anion gap 8 5 - 15    Comment: Performed at [facility], [place] [address], [place], [place] [address]  Troponin I - Once     Status: Abnormal   Collection Time: 04/06/19  6:57 PM  Result Value Ref Range   Troponin I 0.47 (HH) <0.03 ng/mL    Comment: CRITICAL RESULT CALLED TO, READ BACK BY AND VERIFIED WITH: [facility] RN $[email or web address]$  ON 04/06/2019 JACKSON,K Performed at [facility], [place] [address], [place], [place]  [address]   Brain natriuretic peptide     Status: None   Collection Time: 04/06/19  6:57 PM  Result Value Ref Range   B Natriuretic Peptide 76.7 0.0 - 100.0 pg/mL    Comment: Performed at [facility], [place] [address] [place], [place] [address]  CBC with Differential     Status: Abnormal   Collection Time: 04/06/19  6:57 PM  Result Value Ref Range   WBC 7.3 4.0 - 10.5 K/uL   RBC 4.41 4.22 - 5.81 MIL/uL   Hemoglobin 12.2 (L) 13.0 - 17.0 g/dL   HCT 38.3 (L) 39.0 - 52.0 %   MCV 86.8 80.0 - 100.0 fL   MCH 27.7 26.0 - 34.0 pg   MCHC 31.9 30.0 - 36.0 g/dL   RDW 14.6 11.5 - 15.5 %   Platelets 186 150 - 400 K/uL   nRBC 0.0 0.0 - 0.2 %   Neutrophils Relative % 57 %   Neutro Abs 4.2 1.7 - 7.7 K/uL   Lymphocytes Relative 29 %   Lymphs Abs 2.1 0.7 - 4.0 K/uL   Monocytes Relative 11 %   Monocytes Absolute 0.8 0.1 - 1.0 K/uL   Eosinophils Relative 1 %   Eosinophils Absolute 0.1 0.0 - 0.5 K/uL   Basophils Relative 0 %   Basophils Absolute 0.0 0.0 - 0.1 K/uL   Immature Granulocytes 2 %   Abs Immature Granulocytes 0.12 (H) 0.00 - 0.07 K/uL    Comment: Performed at [facility], [place] [address] [place], [place] [address]   Dg Chest 2 View  Result Date: 04/06/2019 CLINICAL DATA:  63 year old male with history of shortness of breath for week. Bilateral leg swelling. EXAM: CHEST - 2 VIEW COMPARISON:  Chest x-ray 03/14/2018. FINDINGS: Scattered linear opacities throughout the mid lungs bilaterally, similar to prior examinations, most compatible with mild scarring. Lung volumes are normal. No consolidative airspace disease. No pleural effusions. No pneumothorax. No pulmonary nodule or mass noted. Pulmonary vasculature and the cardiomediastinal silhouette are within normal limits. IMPRESSION: No radiographic evidence of acute cardiopulmonary disease. Electronically Signed   By: Vinnie Langton M.D.   On: 04/06/2019 18:52    Pending Labs [employer] (From  admission,  onward)    Start     Ordered   04/07/19 0500  CBC  Daily,   R     04/06/19 2134   04/06/19 2115  Protime-INR  Once,   STAT     04/06/19 2114          Vitals/Pain Today's Vitals   04/06/19 2130 04/06/19 2130 04/06/19 2132 04/06/19 2133  BP: (!) 150/87  (!) 150/87   Pulse: 73  73   Resp: (!) 22  15   Temp:      TempSrc:      SpO2: 97%  97%   Weight:  (!) 141.5 kg    Height:  $Remove'5\' 9"'apUgOBv$  (1.753 m)    PainSc:    3     Isolation Precautions No active isolations  Medications Medications  heparin bolus via infusion 4,000 Units (has no administration in time range)  heparin ADULT infusion 100 units/mL (25000 units/219mL sodium chloride 0.45%) (has no administration in time range)    Mobility walks Low fall risk   Focused Assessments Cardiac Assessment Handoff:    Lab Results  Component Value Date   CKTOTAL 620 (H) 03/14/2018   CKMB 8.5 (HH) 07/03/2014   TROPONINI 0.47 (HH) 04/06/2019   No results found for: DDIMER Does the Patient currently have chest pain? No     R Recommendations: See Admitting Provider Note  Report given to:   Additional Notes:  Chest pain 2 days ago possible end stemi , today has sob on movement and chest discomfort on palpation  On left side. Denies cough, NVD

## 2019-04-06 NOTE — Progress Notes (Signed)
Based on what you shared with me, I feel your condition warrants further evaluation and I recommend that you be seen for a face to face office visit.   NOTE: If you entered your credit card information for this eVisit, you will not be charged. You may see a "hold" on your card for the $35 but that hold will drop off and you will not have a charge processed.  If you are having a true medical emergency please call 911.  If you need an urgent face to face visit, [place] has four urgent care centers for your convenience.     The following sites will take your insurance:  . [facility] [place] a Provider at this Location  [address] [place], [place] [address] . 10 am to 8 pm Monday-Friday . 12 pm to 8 pm Saturday-Sunday   . [facility] at [place] a Provider at this Location  [place], [place], [place] [address] . 8 am to 8 pm Monday-Friday . 9 am to 6 pm Saturday . 11 am to 6 pm Sunday   . [facility] at [place] Get Driving Directions  [address] [place], [place] [address] . 8 am to 8 pm Monday-Friday . 8 am to 4 pm Saturday-Sunday   Your e-visit answers were reviewed by a board certified advanced clinical practitioner to complete your personal care plan.  Thank you for using e-Visits.  I have spent 5 minutes in review of e-visit questionnaire, review and updating patient chart, medical decision making and response to patient.    Tenna Delaine, PA-C

## 2019-04-07 ENCOUNTER — Encounter (HOSPITAL_COMMUNITY): Payer: Self-pay | Admitting: Internal Medicine

## 2019-04-07 ENCOUNTER — Inpatient Hospital Stay (HOSPITAL_COMMUNITY): Payer: Medicaid Other

## 2019-04-07 DIAGNOSIS — I214 Non-ST elevation (NSTEMI) myocardial infarction: Principal | ICD-10-CM

## 2019-04-07 LAB — BASIC METABOLIC PANEL
Anion gap: 12 (ref 5–15)
BUN: 32 mg/dL — ABNORMAL HIGH (ref 6–20)
CO2: 23 mmol/L (ref 22–32)
Calcium: 9.3 mg/dL (ref 8.9–10.3)
Chloride: 99 mmol/L (ref 98–111)
Creatinine, Ser: 1.96 mg/dL — ABNORMAL HIGH (ref 0.61–1.24)
GFR calc Af Amer: 42 mL/min — ABNORMAL LOW (ref >60)
GFR calc non Af Amer: 36 mL/min — ABNORMAL LOW (ref >60)
Glucose, Bld: 445 mg/dL — ABNORMAL HIGH (ref 70–99)
Potassium: 3.9 mmol/L (ref 3.5–5.1)
Sodium: 134 mmol/L — ABNORMAL LOW (ref 135–145)

## 2019-04-07 LAB — TSH: TSH: 1.823 u[IU]/mL (ref 0.350–4.500)

## 2019-04-07 LAB — GLUCOSE, CAPILLARY
Glucose-Capillary: 212 mg/dL — ABNORMAL HIGH (ref 70–99)
Glucose-Capillary: 214 mg/dL — ABNORMAL HIGH (ref 70–99)
Glucose-Capillary: 261 mg/dL — ABNORMAL HIGH (ref 70–99)
Glucose-Capillary: 297 mg/dL — ABNORMAL HIGH (ref 70–99)
Glucose-Capillary: 351 mg/dL — ABNORMAL HIGH (ref 70–99)
Glucose-Capillary: 438 mg/dL — ABNORMAL HIGH (ref 70–99)

## 2019-04-07 LAB — CBC
HCT: 38.1 % — ABNORMAL LOW (ref 39.0–52.0)
Hemoglobin: 12.5 g/dL — ABNORMAL LOW (ref 13.0–17.0)
MCH: 28.2 pg (ref 26.0–34.0)
MCHC: 32.8 g/dL (ref 30.0–36.0)
MCV: 85.8 fL (ref 80.0–100.0)
Platelets: 168 10*3/uL (ref 150–400)
RBC: 4.44 MIL/uL (ref 4.22–5.81)
RDW: 14.9 % (ref 11.5–15.5)
WBC: 5.6 10*3/uL (ref 4.0–10.5)
nRBC: 0 % (ref 0.0–0.2)

## 2019-04-07 LAB — HEMOGLOBIN A1C
Hgb A1c MFr Bld: 15 % — ABNORMAL HIGH (ref 4.8–5.6)
Mean Plasma Glucose: 383.8 mg/dL

## 2019-04-07 LAB — HEPARIN LEVEL (UNFRACTIONATED)
Heparin Unfractionated: 0.28 IU/mL — ABNORMAL LOW (ref 0.30–0.70)
Heparin Unfractionated: 0.35 IU/mL (ref 0.30–0.70)

## 2019-04-07 LAB — T4, FREE: Free T4: 1.19 ng/dL (ref 0.82–1.77)

## 2019-04-07 MED ORDER — INSULIN GLARGINE 100 UNIT/ML ~~LOC~~ SOLN
20.0000 [IU] | Freq: Two times a day (BID) | SUBCUTANEOUS | Status: DC
  Administered 2019-04-07 – 2019-04-08 (×2): 20 [IU] via SUBCUTANEOUS
  Filled 2019-04-07 (×4): qty 0.2

## 2019-04-07 MED ORDER — INSULIN ASPART 100 UNIT/ML ~~LOC~~ SOLN
0.0000 [IU] | Freq: Three times a day (TID) | SUBCUTANEOUS | Status: DC
  Administered 2019-04-08: 4 [IU] via SUBCUTANEOUS

## 2019-04-07 MED ORDER — INSULIN NPH (HUMAN) (ISOPHANE) 100 UNIT/ML ~~LOC~~ SUSP
25.0000 [IU] | Freq: Two times a day (BID) | SUBCUTANEOUS | Status: DC
  Filled 2019-04-07: qty 10

## 2019-04-07 MED ORDER — INSULIN ASPART 100 UNIT/ML ~~LOC~~ SOLN
0.0000 [IU] | Freq: Every day | SUBCUTANEOUS | Status: DC
  Administered 2019-04-07: 2 [IU] via SUBCUTANEOUS

## 2019-04-07 MED ORDER — INSULIN ASPART 100 UNIT/ML ~~LOC~~ SOLN
6.0000 [IU] | Freq: Three times a day (TID) | SUBCUTANEOUS | Status: DC
  Administered 2019-04-08: 6 [IU] via SUBCUTANEOUS

## 2019-04-07 NOTE — Progress Notes (Signed)
ANTICOAGULATION CONSULT NOTE - Follow up [facility] for Heparin Indication: chest pain/ACS  Allergies  Allergen Reactions  . Chlorthalidone Other (See Comments)    Causes dehydration, kidneys shut down    Patient Measurements: Height: 5\' 9"  (175.3 cm) Weight: (!) 310 lb 6.4 oz (140.8 kg) IBW/kg (Calculated) : 70.7 Heparin Dosing Weight: 104.1 kg  Vital Signs: Temp: 98.3 F (36.8 C) (04/25 0542) Temp Source: Oral (04/25 0542) BP: 147/84 (04/25 0542) Pulse Rate: 77 (04/25 0542)  Labs: Recent Labs    04/06/19 1857 04/06/19 2115 04/07/19 0614  HGB 12.2*  --   --   HCT 38.3*  --   --   PLT 186  --   --   LABPROT  --  11.9  --   INR  --  0.9  --   HEPARINUNFRC  --   --  0.28*  CREATININE 2.01*  --   --   TROPONINI 0.47*  --   --     Estimated Creatinine Clearance: 54.6 mL/min (A) (by C-G formula based on SCr of 2.01 mg/dL (H)).   Medical History: Past Medical History:  Diagnosis Date  . Anemia   . Chronic combined systolic and diastolic CHF (congestive heart failure) ([place])    a. 07/2015 Echo: EF 45-50%, mod LVH, mid apical/antsept AK, Gr 1 DD.  [name] CKD (chronic kidney disease), stage III ([place])   . Coronary artery disease    a. 2012 s/p MI/PCI: s/p DES to mid/dist RCA and overlapping DES to LAD;  b. 07/2015 Cath: LAD 10% ISR, D1 40(jailed), LCX 51m, OM2 30, OM3 30, RCA 25m ISR, 10d ISR; c. 07/2016 NSTEMI/PCI: LAD 85ost/p/m (3.5x20 Synergy DES overlapping prior stent), D1 40, LCX 3m, OM2 95 (2.75x16 Synergy DES), OM3 30, RCA 31m, 10d.  . Depression   . Diabetic gastroparesis ([place])   . DKA (diabetic ketoacidoses) ([place]) 03/14/2018  . GERD (gastroesophageal reflux disease)   . Gout   . Gout   . Heart murmur   . Hyperlipemia   . Hypertensive heart disease   . Ischemic cardiomyopathy    a. 07/2015 Echo: EF 45-50%, mod LVH, mid-apicalanteroseptal AK, Gr1 DD.  . Morbid obesity ([place])   . Myocardial infarction ([place]) 2012  . OSA on CPAP   . Osteomyelitis  ([place])    a. 07/2016 MTP joint of R great toe s/p transmetatarsal amputation.  . Polyneuropathy in diabetes(357.2)   . Pulmonary sarcoidosis ([place])    "problems w/it years ago; no problems anymore" (07/03/2014)  . Rectal bleeding   . Type II diabetes mellitus ([facility])     Medications:  Infusions:  . heparin 1,200 Units/hr (04/06/19 2221)    Assessment: 63 yo M admitted with shortness of breath and chest pain. Pharmacy consulted to dose heparin for ACS/NSTEMI. No oral anticoagulants noted PTA on med rec.   Heparin level slightly subtherapeutic at 0.28 this morning. Hgb 12.2 and pltc 186. No bleeding noted per RN.  Goal of Therapy:  Heparin level 0.3-0.7 units/ml Monitor platelets by anticoagulation protocol: Yes   Plan:  Increase heparin drip to 1300 units/hr Check 6 hour heparin level and daily while on heparin Daily CBC Monitor for bleeding  Vertis Kelch, PharmD PGY1 Pharmacy Resident Phone [phone] 04/07/2019       7:27 AM

## 2019-04-07 NOTE — Progress Notes (Signed)
CBG in the 400's this afternoon. MD on call made aware. 20 units of insulin given per order (in [facility]). Will continue to monitor pt's CBG.

## 2019-04-07 NOTE — Consult Note (Signed)
Medical Consultation   SILVER PARKEY  QAS:[id number]  DOB: 05/29/1956  DOA: 04/06/2019  PCP: [name], MD   Outpatient Specialists: [facility] - cardiology   Requesting physician: [facility] - cardiology  Reason for consultation: NSTEMI, needs cath.  Blood sugars in 400s despite home therapy.  Needs DM management.   History of Present Illness: Joshua [name] is an 63 y.o. male with h/o DM; sarcoidosis; OSA on CPAP; CAD; morbid obesity (BMI 46); HTN; HLD; stage 3 CKD; and chronic combined CHF presenting yesterday with NSTEMI and CHF exacerbation.  He presented with SOB concerning for "[place]".  All of a sudden, he couldn't sleep and couldn't get enough air.  He had chest tightness.  Bad angina Wednesday night and it wouldn't go away.  He has had worsening angina in the last year or so.  He took NTG with some improvement but not resolution of the pain.  He finally was able to sleep a little but ongoing orthopnea.  He did a virtual visit and he was told to come in.    He is not very good at checking his sugars, "I basically check when I'm feeling some kind of way."  They are quite labile - 100s-400s and occasional lows to the 70s with hypoglycemia.  He takes 34 units 70/30 BID and about 10-12 units of regular - he doesn't check often enough to really use a sliding scale.  He hasn't been on Lantus in years.  He was on NPH until a year ago and then was changed to 70/30 a year ago after hospitalization.  Review of Systems:  ROS As per HPI otherwise 10 point review of systems negative.    Past Medical History: Past Medical History:  Diagnosis Date   Anemia    Chronic combined systolic and diastolic CHF (congestive heart failure) ([place])    a. 07/2015 Echo: EF 45-50%, mod LVH, mid apical/antsept AK, Gr 1 DD.   CKD (chronic kidney disease), stage III ([place])    Coronary artery disease    a. 2012 s/p MI/PCI: s/p DES to mid/dist RCA and overlapping DES to LAD;  b. 07/2015  Cath: LAD 10% ISR, D1 40(jailed), LCX 17m, OM2 30, OM3 30, RCA 45m ISR, 10d ISR; c. 07/2016 NSTEMI/PCI: LAD 85ost/p/m (3.5x20 Synergy DES overlapping prior stent), D1 40, LCX 23m, OM2 95 (2.75x16 Synergy DES), OM3 30, RCA 10m, 10d.   Depression    Diabetic gastroparesis ([place])    DKA (diabetic ketoacidoses) ([place]) 03/14/2018   GERD (gastroesophageal reflux disease)    Gout    Heart murmur    Hyperlipemia    Hypertensive heart disease    Ischemic cardiomyopathy    a. 07/2015 Echo: EF 45-50%, mod LVH, mid-apicalanteroseptal AK, Gr1 DD.   Morbid obesity ([place])    OSA on CPAP    Osteomyelitis ([place])    a. 07/2016 MTP joint of R great toe s/p transmetatarsal amputation.   Polyneuropathy in diabetes(357.2)    Pulmonary sarcoidosis ([place])    "problems w/it years ago; no problems anymore" (07/03/2014)   Rectal bleeding    Type II diabetes mellitus ([place])     Past Surgical History: Past Surgical History:  Procedure Laterality Date   AMPUTATION Right 07/24/2016   Procedure: TRANSMETATARSAL FOOT AMPUTATION;  Surgeon: [name], MD;  Location: [place];  Service: Orthopedics;  Laterality: Right;   BALLOON DILATION N/A 03/21/2018   Procedure: BALLOON  DILATION;  Surgeon: [name], MD;  Location[facility] ENDOSCOPY;  Service: Endoscopy;  Laterality: N/A;   BREAST LUMPECTOMY Right ~ 2000   "benign"   CARDIAC CATHETERIZATION N/A 07/30/2015   Procedure: Left Heart Cath and Coronary Angiography;  Surgeon: Burnell Blanks, MD;  Location: [place] CV LAB;  Service: Cardiovascular;  Laterality: N/A;   CARDIAC CATHETERIZATION N/A 07/19/2016   Procedure: Left Heart Cath and Coronary Angiography;  Surgeon: Belva Crome, MD;  Location: [place] CV LAB;  Service: Cardiovascular;  Laterality: N/A;   CARDIAC CATHETERIZATION N/A 07/29/2016   Procedure: Coronary Stent Intervention;  Surgeon: Belva Crome, MD;  Location: [place] CV LAB;  Service: Cardiovascular;  Laterality: N/A;    CARDIAC CATHETERIZATION N/A 07/29/2016   Procedure: Intravascular Ultrasound/IVUS;  Surgeon: Belva Crome, MD;  Location: [place] CV LAB;  Service: Cardiovascular;  Laterality: N/A;   CATARACT EXTRACTION W/ INTRAOCULAR LENS  IMPLANT, BILATERAL Bilateral 2014-2015   COLONOSCOPY WITH PROPOFOL N/A 08/22/2017   Procedure: COLONOSCOPY WITH PROPOFOL;  Surgeon: [name], MD;  Location: [place];  Service: Endoscopy;  Laterality: N/A;   CORONARY ANGIOPLASTY WITH STENT PLACEMENT  10/31/2011   30 % MID ATRIOVENTRICULAR GROOVE CX STENOSIS. 90 % MID RCA.PTA TO LAD/STENTING OF TANDEM 60-70%. LAD STENOSIS 99% REDUCED TO 0%.3.0 X 32MM PROMUS DES POSTDILATED TO 3.25MM.   CORONARY ANGIOPLASTY WITH STENT PLACEMENT  07/03/2014   "2"   ESOPHAGOGASTRODUODENOSCOPY (EGD) WITH PROPOFOL N/A 03/21/2018   Procedure: ESOPHAGOGASTRODUODENOSCOPY (EGD) WITH PROPOFOL;  Surgeon: [name], MD;  Location: [facility] ENDOSCOPY;  Service: Endoscopy;  Laterality: N/A;   I&D EXTREMITY Right 10/30/2013   Procedure: IRRIGATION AND DEBRIDEMENT RIGHT SMALL FINGER POSSIBLE SECONDARY WOUND CLOSURE;  Surgeon: [name], MD;  Location: [facility];  Service: Orthopedics;  Laterality: Right;  [name] is available after 4pm   I&D EXTREMITY Right 11/01/2013   Procedure:  IRRIGATION AND DEBRIDEMENT RIGHT  PROXIMAL PHALANGEAL LEVEL AMPUTATION;  Surgeon: [name], MD;  Location: [facility];  Service: Orthopedics;  Laterality: Right;   INCISION AND DRAINAGE Right 10/28/2013   Procedure: INCISION AND DRAINAGE Right small finger;  Surgeon: [name], MD;  Location: [facility];  Service: Orthopedics;  Laterality: Right;   LEFT HEART CATH Left 11/03/2011   Procedure: LEFT HEART CATH;  Surgeon: [name], MD;  Location: [facility] CATH LAB;  Service: Cardiovascular;  Laterality: Left;   LEFT HEART CATHETERIZATION WITH CORONARY ANGIOGRAM N/A 07/03/2014   Procedure: LEFT HEART CATHETERIZATION WITH CORONARY ANGIOGRAM;   Surgeon: [name], MD;  Location: [facility] CATH LAB;  Service: Cardiovascular;  Laterality: N/A;   PERCUTANEOUS CORONARY STENT INTERVENTION (PCI-S) Left 11/03/2011   Procedure: PERCUTANEOUS CORONARY STENT INTERVENTION (PCI-S);  Surgeon: [name], MD;  Location: [facility] CATH LAB;  Service: Cardiovascular;  Laterality: Left;   TOE AMPUTATION Right ~ 2010   "2nd toe"   TOE AMPUTATION Right 2012   "3rd toe"     Allergies:   Allergies  Allergen Reactions   Chlorthalidone Other (See Comments)    Causes dehydration, kidneys shut down     Social History:  reports that he has never smoked. He has never used smokeless tobacco. He reports current alcohol use. He reports that he does not use drugs.   Family History: Family History  Problem Relation Age of Onset   Heart attack Maternal Aunt    Diabetes Maternal Grandmother    Hypertension Maternal Grandmother       Physical Exam:  Vitals:   04/06/19 2342 04/07/19 0542 04/07/19 0730 04/07/19 1109  BP: (!) 149/84 (!) 147/84 137/77 (!) 148/79  Pulse:  77    Resp: 16     Temp: 98.5 F (36.9 C) 98.3 F (36.8 C)    TempSrc: Oral Oral    SpO2: 96% 100%    Weight: (!) 140.8 kg (!) 140.2 kg    Height: $Remove'5\' 9"'yrzGhZq$  (1.753 m)       Constitutional: Alert and awake, oriented x3, not in any acute distress. Eyes: EOMI, irises appear normal, anicteric sclera,  ENMT: external ears and nose appear normal, normal hearing, Lips appear normal, oropharynx mucosa, tongue, posterior pharynx appear normal  Neck: neck appears normal, no masses, normal ROM, no thyromegaly, no JVD  CVS: S1-S2 clear, no murmur rubs or gallops, no LE edema, normal pedal pulses  Respiratory:  clear to auscultation bilaterally, no wheezing, rales or rhonchi. Respiratory effort normal. No accessory muscle use.  Abdomen: soft nontender, nondistended Musculoskeletal: : no cyanosis, clubbing or edema noted bilaterally; s/p R TMA Psych: judgement and insight appear normal,  stable mood and affect, mental status Skin: no rashes or lesions or ulcers, no induration or nodules    Data reviewed:  I have personally reviewed the recent labs and imaging studies  Pertinent Labs:   Glucose 445, 351, 297 BUN 32/Creatinine 1.96/GFR 42 - at/better than baseline WBC 5.6 Hgb 12.5 Normal thyroid studies Troponin 0.47  Inpatient Medications:   Scheduled Meds:  allopurinol  300 mg Oral Daily   amLODipine  10 mg Oral Daily   aspirin EC  81 mg Oral Daily   hydrALAZINE  50 mg Oral TID   insulin aspart  0-20 Units Subcutaneous TID WC   insulin aspart  0-5 Units Subcutaneous QHS   insulin NPH Human  25 Units Subcutaneous BID AC & HS   isosorbide mononitrate  120 mg Oral BID   metoprolol succinate  150 mg Oral Daily   pantoprazole  40 mg Oral BID AC   rosuvastatin  40 mg Oral Daily   ticagrelor  90 mg Oral BID   Continuous Infusions:  heparin 1,300 Units/hr (04/07/19 1626)     Radiological Exams on Admission: Dg Chest 2 View  Result Date: 04/06/2019 CLINICAL DATA:  63 year old male with history of shortness of breath for week. Bilateral leg swelling. EXAM: CHEST - 2 VIEW COMPARISON:  Chest x-ray 03/14/2018. FINDINGS: Scattered linear opacities throughout the mid lungs bilaterally, similar to prior examinations, most compatible with mild scarring. Lung volumes are normal. No consolidative airspace disease. No pleural effusions. No pneumothorax. No pulmonary nodule or mass noted. Pulmonary vasculature and the cardiomediastinal silhouette are within normal limits. IMPRESSION: No radiographic evidence of acute cardiopulmonary disease. Electronically Signed   By: Vinnie Langton M.D.   On: 04/06/2019 18:52    Impression/Recommendations Principal Problem:   NSTEMI (non-ST elevated myocardial infarction) ([place]) Active Problems:   Hyperlipidemia   Morbid obesity due to excess calories (HCC)   Sleep apnea- on C-pap   Chronic combined systolic and diastolic  congestive heart failure (HCC)   CKD (chronic kidney disease) stage 3, GFR 30-59 ml/min (HCC)   DM type 2 with diabetic peripheral neuropathy ([place])   Essential hypertension  NSTEMI -Patient admitted with CP and elevated troponin, known h/o CAD -Cardiology following with plan for cath -on Heparin drip  Uncontrolled DM -Patient with apparent lack of motivation regarding glycemic control -He is currently taking 70/30 at home with SSI, but he does not check  his sugars and so is doing a semi-basal/bolus  -He is likely to have improved glycemic control with basal insulin and boluses at mealtimes (without SSI, to simplify his regimen at home) -Will check A1c - it was >15 1 year ago -Will change to Lantus with 20 units BID and add standing mealtime coverage and SSI coverage qAC and qhs until adequate control is obtained -Diabetes coordinator consulted  HTN -Continue Norvasc, Hydralazine, Toprol XL  HLD -Continue Crestor  OSA on CPAP -Continue CPAP  Morbid obesity -His BMI is 46 -He needs to make significant diet/exercise efforts towards weight loss  Stage 3 CKD -Patient already has had an amputation and now has apparently progressive renal failure -He desperately needs to get his glycemic control improved in order to prevent further progression and need for HD -Currently it appears to be stable    Thank you for this consultation.  Our [facility] hospitalist team will follow the patient with you.   Time Spent: 64 minutes  Karmen Bongo M.D. [facility] 04/07/2019, 5:59 PM

## 2019-04-07 NOTE — Progress Notes (Signed)
  Echocardiogram 2D Echocardiogram has been performed.  Joshua [name] 04/07/2019, 3:34 PM

## 2019-04-07 NOTE — Progress Notes (Signed)
ANTICOAGULATION CONSULT NOTE - Follow up Consult  Pharmacy Consult for Heparin Indication: chest pain/ACS  Allergies  Allergen Reactions  . Chlorthalidone Other (See Comments)    Causes dehydration, kidneys shut down    Patient Measurements: Height: 5\' 9"  (175.3 cm) Weight: (!) 309 lb (140.2 kg) IBW/kg (Calculated) : 70.7 Heparin Dosing Weight: 104.1 kg  Vital Signs: Temp: 98.3 F (36.8 C) (04/25 0542) Temp Source: Oral (04/25 0542) BP: 148/79 (04/25 1109) Pulse Rate: 77 (04/25 0542)  Labs: Recent Labs    04/06/19 1857 04/06/19 2115 04/07/19 0614 04/07/19 1132 04/07/19 1357  HGB 12.2*  --   --   --   --   HCT 38.3*  --   --   --   --   PLT 186  --   --   --   --   LABPROT  --  11.9  --   --   --   INR  --  0.9  --   --   --   HEPARINUNFRC  --   --  0.28*  --  0.35  CREATININE 2.01*  --   --  1.96*  --   TROPONINI 0.47*  --   --   --   --     Estimated Creatinine Clearance: 55.8 mL/min (A) (by C-G formula based on SCr of 1.96 mg/dL (H)).   Medical History: Past Medical History:  Diagnosis Date  . Anemia   . Chronic combined systolic and diastolic CHF (congestive heart failure) (HCC)    a. 07/2015 Echo: EF 45-50%, mod LVH, mid apical/antsept AK, Gr 1 DD.  08/2015 CKD (chronic kidney disease), stage III (HCC)   . Coronary artery disease    a. 2012 s/p MI/PCI: s/p DES to mid/dist RCA and overlapping DES to LAD;  b. 07/2015 Cath: LAD 10% ISR, D1 40(jailed), LCX 30m, OM2 30, OM3 30, RCA 33m ISR, 10d ISR; c. 07/2016 NSTEMI/PCI: LAD 85ost/p/m (3.5x20 Synergy DES overlapping prior stent), D1 40, LCX 59m, OM2 95 (2.75x16 Synergy DES), OM3 30, RCA 52m, 10d.  . Depression   . Diabetic gastroparesis (HCC)   . DKA (diabetic ketoacidoses) (HCC) 03/14/2018  . GERD (gastroesophageal reflux disease)   . Gout   . Gout   . Heart murmur   . Hyperlipemia   . Hypertensive heart disease   . Ischemic cardiomyopathy    a. 07/2015 Echo: EF 45-50%, mod LVH, mid-apicalanteroseptal AK, Gr1  DD.  . Morbid obesity (HCC)   . Myocardial infarction (HCC) 2012  . OSA on CPAP   . Osteomyelitis (HCC)    a. 07/2016 MTP joint of R great toe s/p transmetatarsal amputation.  . Polyneuropathy in diabetes(357.2)   . Pulmonary sarcoidosis (HCC)    "problems w/it years ago; no problems anymore" (07/03/2014)  . Rectal bleeding   . Type II diabetes mellitus (HCC)     Medications:  Infusions:  . heparin 1,300 Units/hr (04/07/19 0739)    Assessment: 63 yo M admitted with shortness of breath and chest pain. Pharmacy consulted to dose heparin for ACS/NSTEMI. No oral anticoagulants noted PTA on med rec.   Heparin level now within goal range. No bleeding noted per RN.  No CBC drawn yet today.  Goal of Therapy:  Heparin level 0.3-0.7 units/ml Monitor platelets by anticoagulation protocol: Yes   Plan:  Continue IV heparin at current rate. Daily heparin level and CBC Check CBC now. F/u plans for heparin after cath on Monday.  Saturday, Pharm D, BCPS,  [facility] Clinical Pharmacist Phone [phone]  04/07/2019 3:09 PM

## 2019-04-07 NOTE — Progress Notes (Signed)
Progress Note  Patient Name: Joshua [name] Date of Encounter: 04/07/2019  Primary Cardiologist: No primary care provider on file.   Subjective   Currently feeling well.  He is not having chest pain or shortness of breath at the moment.  He has had chest pain and shortness of breath over the last few days.  He had quite a bit of pain on Wednesday evening.  His chest pain has since been stuttering.  Inpatient Medications    Scheduled Meds: . allopurinol  300 mg Oral Daily  . amLODipine  10 mg Oral Daily  . aspirin EC  81 mg Oral Daily  . furosemide  40 mg Intravenous BID  . hydrALAZINE  50 mg Oral TID  . insulin aspart  0-20 Units Subcutaneous TID WC  . insulin aspart  0-5 Units Subcutaneous QHS  . insulin NPH Human  25 Units Subcutaneous BID AC & HS  . isosorbide mononitrate  120 mg Oral BID  . metoprolol succinate  150 mg Oral Daily  . pantoprazole  40 mg Oral BID AC  . rosuvastatin  40 mg Oral Daily  . ticagrelor  90 mg Oral BID   Continuous Infusions: . heparin 1,300 Units/hr (04/07/19 0739)   PRN Meds: acetaminophen, metoCLOPramide, nitroGLYCERIN, ondansetron (ZOFRAN) IV   Vital Signs    Vitals:   04/06/19 2230 04/06/19 2342 04/07/19 0542 04/07/19 0730  BP: (!) 152/87 (!) 149/84 (!) 147/84 137/77  Pulse: 72  77   Resp: 17 16    Temp:  98.5 F (36.9 C) 98.3 F (36.8 C)   TempSrc:  Oral Oral   SpO2: 94% 96% 100%   Weight:  (!) 140.8 kg (!) 140.2 kg   Height:  $Remove'5\' 9"'[id number]$  (1.753 m)      Intake/Output Summary (Last 24 hours) at 04/07/2019 1011 Last data filed at 04/07/2019 0900 Gross per 24 hour  Intake 610.67 ml  Output 2575 ml  Net -1964.33 ml   Last 3 Weights 04/07/2019 04/06/2019 04/06/2019  Weight (lbs) 309 lb 310 lb 6.4 oz 312 lb  Weight (kg) 140.161 kg 140.797 kg 141.522 kg      Telemetry    Sinus rhythm- Personally Reviewed  ECG    Anus rhythm, lateral T wave inversions, anterior Q waves- Personally Reviewed  Physical Exam   GEN: No acute  distress.   Neck: No JVD Cardiac: RRR, no murmurs, rubs, or gallops.  Respiratory: Clear to auscultation bilaterally. GI: Soft, nontender, non-distended  MS: No edema; right partial foot amputation, right partial pinky finger amputation Neuro:  Nonfocal  Psych: Normal affect   Labs    Chemistry Recent Labs  Lab 04/06/19 1857  NA 134*  K 3.9  CL 102  CO2 24  GLUCOSE 191*  BUN 36*  CREATININE 2.01*  CALCIUM 8.7*  PROT 7.4  ALBUMIN 3.7  AST 21  ALT 16  ALKPHOS 107  BILITOT 0.5  GFRNONAA 35*  GFRAA 41*  ANIONGAP 8     Hematology Recent Labs  Lab 04/06/19 1857  WBC 7.3  RBC 4.41  HGB 12.2*  HCT 38.3*  MCV 86.8  MCH 27.7  MCHC 31.9  RDW 14.6  PLT 186    Cardiac Enzymes Recent Labs  Lab 04/06/19 1857  TROPONINI 0.47*   No results for input(s): TROPIPOC in the last 168 hours.   BNP Recent Labs  Lab 04/06/19 1857  BNP 76.7     DDimer No results for input(s): DDIMER in the last 168 hours.  Radiology    Dg Chest 2 View  Result Date: 04/06/2019 CLINICAL DATA:  63 year old male with history of shortness of breath for week. Bilateral leg swelling. EXAM: CHEST - 2 VIEW COMPARISON:  Chest x-ray 03/14/2018. FINDINGS: Scattered linear opacities throughout the mid lungs bilaterally, similar to prior examinations, most compatible with mild scarring. Lung volumes are normal. No consolidative airspace disease. No pleural effusions. No pneumothorax. No pulmonary nodule or mass noted. Pulmonary vasculature and the cardiomediastinal silhouette are within normal limits. IMPRESSION: No radiographic evidence of acute cardiopulmonary disease. Electronically Signed   By: Vinnie Langton M.D.   On: 04/06/2019 18:52    Cardiac Studies   SPECT 12/26/18  Nuclear stress EF: 33%.  The left ventricular ejection fraction is moderately decreased (30-44%).  No T wave inversion was noted during stress.  There was no ST segment deviation noted during stress.  Defect 1:  There is a medium defect of moderate severity.  This is a high risk study.  Patient Profile     63 y.o. male with a longstanding history of diabetes and coronary artery disease presenting with chest pain and shortness of breath.  Assessment & Plan    1.  Non-STEMI: Troponin elevation 0.46 which is higher than it usually is on admissions.  Agree with heparin drip.  Plan to restart home aspirin intact [place] as of last night.  We Danella Philson plan for left heart catheterization on Monday.  2.  Acute on chronic renal failure: Creatinine is elevated and has been since December.  We Marijah Larranaga continue to monitor.  He Estell Dillinger likely need some fluid prior to catheterization Monday.  3.  Type 2 diabetes: Currently on sliding scale.  Trecia Maring increase his dose of NPH from 22 to 25 units.  Poorly controlled.  4.  Systolic heart failure, chronic: Unclear if he is volume overloaded at this time.  Chest x-ray shows no evidence of volume overload.  He had an EF of 33% on his stress test.  We Hassani Sliney plan for repeat echo.  5.  Hypertension: Controlled.  Currently on Norvasc,  hydralazine, Imdur, Toprol-XL.  6.  Hyperlipidemia: Continue Crestor 40 mg  For questions or updates, please contact [place] Please consult [email or web address] for contact info under        Signed, Yarixa Lightcap Meredith Leeds, MD  04/07/2019, 10:11 AM

## 2019-04-08 DIAGNOSIS — E1142 Type 2 diabetes mellitus with diabetic polyneuropathy: Secondary | ICD-10-CM

## 2019-04-08 LAB — HEPARIN LEVEL (UNFRACTIONATED): Heparin Unfractionated: 0.44 IU/mL (ref 0.30–0.70)

## 2019-04-08 LAB — CBC
HCT: 35 % — ABNORMAL LOW (ref 39.0–52.0)
Hemoglobin: 11.3 g/dL — ABNORMAL LOW (ref 13.0–17.0)
MCH: 27.4 pg (ref 26.0–34.0)
MCHC: 32.3 g/dL (ref 30.0–36.0)
MCV: 85 fL (ref 80.0–100.0)
Platelets: 165 10*3/uL (ref 150–400)
RBC: 4.12 MIL/uL — ABNORMAL LOW (ref 4.22–5.81)
RDW: 14.8 % (ref 11.5–15.5)
WBC: 5.9 10*3/uL (ref 4.0–10.5)
nRBC: 0 % (ref 0.0–0.2)

## 2019-04-08 LAB — GLUCOSE, CAPILLARY
Glucose-Capillary: 178 mg/dL — ABNORMAL HIGH (ref 70–99)
Glucose-Capillary: 177 mg/dL — ABNORMAL HIGH (ref 70–99)
Glucose-Capillary: 166 mg/dL — ABNORMAL HIGH (ref 70–99)
Glucose-Capillary: 95 mg/dL (ref 70–99)

## 2019-04-08 MED ORDER — INSULIN GLARGINE 100 UNIT/ML ~~LOC~~ SOLN
22.0000 [IU] | Freq: Two times a day (BID) | SUBCUTANEOUS | Status: DC
  Administered 2019-04-08: 21:00:00 11 [IU] via SUBCUTANEOUS
  Filled 2019-04-08 (×4): qty 0.22

## 2019-04-08 MED ORDER — INSULIN ASPART 100 UNIT/ML ~~LOC~~ SOLN
0.0000 [IU] | Freq: Three times a day (TID) | SUBCUTANEOUS | Status: DC
  Administered 2019-04-08 (×2): 3 [IU] via SUBCUTANEOUS
  Administered 2019-04-09: 2 [IU] via SUBCUTANEOUS
  Administered 2019-04-09 (×2): 3 [IU] via SUBCUTANEOUS
  Administered 2019-04-10: 5 [IU] via SUBCUTANEOUS
  Administered 2019-04-10: 7 [IU] via SUBCUTANEOUS

## 2019-04-08 MED ORDER — LIVING WELL WITH DIABETES BOOK: Freq: Once | Status: DC

## 2019-04-08 MED ORDER — SODIUM CHLORIDE 0.9% FLUSH: 3.0000 mL | INTRAVENOUS | Status: DC | PRN

## 2019-04-08 MED ORDER — SODIUM CHLORIDE 0.9% FLUSH: 3.0000 mL | Freq: Two times a day (BID) | INTRAVENOUS | Status: DC

## 2019-04-08 MED ORDER — SODIUM CHLORIDE 0.9 % WEIGHT BASED INFUSION
1.0000 mL/kg/h | INTRAVENOUS | Status: DC
  Administered 2019-04-09: 12:00:00 1 mL/kg/h via INTRAVENOUS

## 2019-04-08 MED ORDER — SODIUM CHLORIDE 0.9 % IV SOLN: 250.0000 mL | INTRAVENOUS | Status: DC | PRN

## 2019-04-08 MED ORDER — INSULIN ASPART 100 UNIT/ML ~~LOC~~ SOLN
8.0000 [IU] | Freq: Three times a day (TID) | SUBCUTANEOUS | Status: DC
  Administered 2019-04-08 (×2): 8 [IU] via SUBCUTANEOUS

## 2019-04-08 MED ORDER — SODIUM CHLORIDE 0.9 % WEIGHT BASED INFUSION
3.0000 mL/kg/h | INTRAVENOUS | Status: DC
  Administered 2019-04-09: 06:00:00 3 mL/kg/h via INTRAVENOUS

## 2019-04-08 NOTE — Progress Notes (Signed)
Progress Note  Patient Name: Joshua [name] Date of Encounter: 04/08/2019  Primary Cardiologist: No primary care provider on file.   Subjective   No further episodes of chest pain since admission to the hospital.  Troponin has peaked at 0.46.  He met with family medicine yesterday to discuss his diabetes.  They are making adjustments to his diabetic medications.  I am concerned that he does not have much insight into diabetes management.  Diabetes coordinator has been consulted.  Inpatient Medications    Scheduled Meds:  allopurinol  300 mg Oral Daily   amLODipine  10 mg Oral Daily   aspirin EC  81 mg Oral Daily   hydrALAZINE  50 mg Oral TID   insulin aspart  0-20 Units Subcutaneous TID WC   insulin aspart  0-5 Units Subcutaneous QHS   insulin aspart  6 Units Subcutaneous TID WC   insulin glargine  20 Units Subcutaneous BID   isosorbide mononitrate  120 mg Oral BID   metoprolol succinate  150 mg Oral Daily   pantoprazole  40 mg Oral BID AC   rosuvastatin  40 mg Oral Daily   ticagrelor  90 mg Oral BID   Continuous Infusions:  heparin 1,300 Units/hr (04/07/19 1626)   PRN Meds: acetaminophen, metoCLOPramide, nitroGLYCERIN, ondansetron (ZOFRAN) IV   Vital Signs    Vitals:   04/07/19 0730 04/07/19 1109 04/07/19 2039 04/08/19 0628  BP: 137/77 (!) 148/79 132/79 (!) 156/96  Pulse:   66 77  Resp:   18   Temp:   98.1 F (36.7 C) 98.4 F (36.9 C)  TempSrc:   Oral Oral  SpO2:   99% 99%  Weight:    (!) 136.9 kg  Height:        Intake/Output Summary (Last 24 hours) at 04/08/2019 0745 Last data filed at 04/08/2019 2563 Gross per 24 hour  Intake 960 ml  Output 2950 ml  Net -1990 ml   Last 3 Weights 04/08/2019 04/07/2019 04/06/2019  Weight (lbs) 301 lb 14.4 oz 309 lb 310 lb 6.4 oz  Weight (kg) 136.941 kg 140.161 kg 140.797 kg      Telemetry    Sinus rhythm- Personally Reviewed  ECG    None new- Personally Reviewed  Physical Exam   GEN: Well  nourished, well developed, in no acute distress  HEENT: normal  Neck: no JVD, carotid bruits, or masses Cardiac: RRR; no murmurs, rubs, or gallops,no edema  Respiratory:  clear to auscultation bilaterally, normal work of breathing GI: soft, nontender, nondistended, + BS MS: no deformity or atrophy  Skin: warm and dry Neuro:  Strength and sensation are intact Psych: euthymic mood, full affect   Labs    Chemistry Recent Labs  Lab 04/06/19 1857 04/07/19 1132  NA 134* 134*  K 3.9 3.9  CL 102 99  CO2 24 23  GLUCOSE 191* 445*  BUN 36* 32*  CREATININE 2.01* 1.96*  CALCIUM 8.7* 9.3  PROT 7.4  --   ALBUMIN 3.7  --   AST 21  --   ALT 16  --   ALKPHOS 107  --   BILITOT 0.5  --   GFRNONAA 35* 36*  GFRAA 41* 42*  ANIONGAP 8 12     Hematology Recent Labs  Lab 04/06/19 1857 04/07/19 1541 04/08/19 0302  WBC 7.3 5.6 5.9  RBC 4.41 4.44 4.12*  HGB 12.2* 12.5* 11.3*  HCT 38.3* 38.1* 35.0*  MCV 86.8 85.8 85.0  MCH 27.7 28.2 27.4  MCHC 31.9 32.8 32.3  RDW 14.6 14.9 14.8  PLT 186 168 165    Cardiac Enzymes Recent Labs  Lab 04/06/19 1857  TROPONINI 0.47*   No results for input(s): TROPIPOC in the last 168 hours.   BNP Recent Labs  Lab 04/06/19 1857  BNP 76.7     DDimer No results for input(s): DDIMER in the last 168 hours.   Radiology    Dg Chest 2 View  Result Date: 04/06/2019 CLINICAL DATA:  63 year old male with history of shortness of breath for week. Bilateral leg swelling. EXAM: CHEST - 2 VIEW COMPARISON:  Chest x-ray 03/14/2018. FINDINGS: Scattered linear opacities throughout the mid lungs bilaterally, similar to prior examinations, most compatible with mild scarring. Lung volumes are normal. No consolidative airspace disease. No pleural effusions. No pneumothorax. No pulmonary nodule or mass noted. Pulmonary vasculature and the cardiomediastinal silhouette are within normal limits. IMPRESSION: No radiographic evidence of acute cardiopulmonary disease.  Electronically Signed   By: Vinnie Langton M.D.   On: 04/06/2019 18:52    Cardiac Studies    TTE 04/07/19  Left Ventricle: The left ventricle has normal systolic function, with an ejection fraction of 55-60%. The cavity size was normal. There is no increase in left ventricular wall thickness. Indeterminate diastolic filling due to E-A fusion.  Patient Profile     63 y.o. male with a longstanding history of diabetes and coronary artery disease presenting with chest pain and shortness of breath.  Assessment & Plan    1.  Non-STEMI: Troponin peaked 0.6 which is higher than it is on his admissions usually.  He is currently on heparin and aspirin.  We Reznor Ferrando plan for left heart catheterization tomorrow.  2.  Acute on chronic renal failure: Creatinine is elevated and has been since December.  Likely due to diabetic nephropathy as his diabetes is very poorly controlled.  3.  Type 2 diabetes: Family medicine consulted for assistance with his diabetes care.  They have called the diabetes coordinator.  Plan per family medicine.  I am certainly concerned that he does not have much in the way of insight into his diabetes.  4.  Systolic heart failure, chronic: Echo this admission shows a normal ejection fraction.  5.  Hypertension: Controlled.  Continue Norvasc, hydralazine, Imdur, Toprol-XL  6.  Hyperlipidemia: Continue Crestor 40 mg.  For questions or updates, please contact [place] Please consult [email or web address] for contact info under        Signed, Jerita Wimbush Meredith Leeds, MD  04/08/2019, 7:45 AM

## 2019-04-08 NOTE — Progress Notes (Signed)
Patient states that he will place self on CPAP once he is ready for bed. Patient is stable at this time no distress or complications noted.

## 2019-04-08 NOTE — Progress Notes (Addendum)
Inpatient Diabetes Program Recommendations  AACE/ADA: New Consensus Statement on Inpatient Glycemic Control (2015)  Target Ranges:  Prepandial:   less than 140 mg/dL      Peak postprandial:   less than 180 mg/dL (1-2 hours)      Critically ill patients:  140 - 180 mg/dL   Lab Results  Component Value Date   GLUCAP 178 (H) 04/08/2019   HGBA1C 15.0 (H) 04/07/2019    Review of Glycemic Control Results for Joshua [name], Joshua [name] (MRN [id number]) as of 04/08/2019 08:50  Ref. Range 04/07/2019 11:03 04/07/2019 14:30 04/07/2019 16:05 04/07/2019 21:06 04/08/2019 07:28  Glucose-Capillary Latest Ref Range: 70 - 99 mg/dL 438 (H) 351 (H) 297 (H) 212 (H) 178 (H)   Diabetes history: DM2 Outpatient Diabetes medications: Novolin 70/30 insulin mix 34 units bid + Novolin R correction scale 2-15 units tid (pt states he normally takes 10 units ac meals when he eats) Current orders for Inpatient glycemic control: Lantus 20 units bid + Novolog 6 units tid added today + Novolog resistant correction scale + hs 0-5 units  Inpatient Diabetes Program Recommendations:    -Increase Lantus to 25 units bid -Increase Novolog to 8 units tid meal coverage if eats 50% -Decrease Novolog correction to moderate -Continuous glucose sensor (Libre) on D/C (#[id number])  This DM coordinator spoke patient on prior admission 03-22-18 and reviewed about elevated A1c of > 15.5.  Agree with current plan of treatment. Ordered Living Well with Diabetes book. Patient states he doesn't take the Novolin R regulary before meals due to no appetite. Also just received his disability and Medicaid status. States he has not always had finances to take his insulin and other medications regularly.  Spoke with pt via phone (DM coordinator working remotely) A1C results 15 (average CBG 384 over the past 2-3 months) and explained what an A1C is, basic pathophysiology of DM Type 2, basic home care, basic diabetes diet nutrition principles, importance of checking  CBGs and maintaining good CBG control to prevent long-term and short-term complications. Reviewed signs and symptoms of hyperglycemia and hypoglycemia and how to treat hypoglycemia at home. Also reviewed blood sugar goals at home.  RNs to provide ongoing basic DM education at bedside with this patient. Have ordered educational booklet, case management consult, and DM videos.  Will follow during hospitalization.  Thank you, Joshua [name]. Joshua Birkel, RN, MSN, CDE  Diabetes Coordinator Inpatient Glycemic Control Team Team Pager [phone] (8am-5pm) 04/08/2019 10:04 AM

## 2019-04-08 NOTE — Progress Notes (Signed)
ANTICOAGULATION CONSULT NOTE - Follow up [place] for Heparin Indication: chest pain/ACS  Allergies  Allergen Reactions  . Chlorthalidone Other (See Comments)    Causes dehydration, kidneys shut down    Patient Measurements: Height: 5\' 9"  (175.3 cm) Weight: (!) 301 lb 14.4 oz (136.9 kg) IBW/kg (Calculated) : 70.7 Heparin Dosing Weight: 104.1 kg  Vital Signs: Temp: 98.4 F (36.9 C) (04/26 0628) Temp Source: Oral (04/26 0628) BP: 156/96 (04/26 0628) Pulse Rate: 77 (04/26 0628)  Labs: Recent Labs    04/06/19 1857 04/06/19 2115 04/07/19 0614 04/07/19 1132 04/07/19 1357 04/07/19 1541 04/08/19 0302  HGB 12.2*  --   --   --   --  12.5* 11.3*  HCT 38.3*  --   --   --   --  38.1* 35.0*  PLT 186  --   --   --   --  168 165  LABPROT  --  11.9  --   --   --   --   --   INR  --  0.9  --   --   --   --   --   HEPARINUNFRC  --   --  0.28*  --  0.35  --  0.44  CREATININE 2.01*  --   --  1.96*  --   --   --   TROPONINI 0.47*  --   --   --   --   --   --     Estimated Creatinine Clearance: 55.1 mL/min (A) (by C-G formula based on SCr of 1.96 mg/dL (H)).   Medical History: Past Medical History:  Diagnosis Date  . Anemia   . Chronic combined systolic and diastolic CHF (congestive heart failure) ([place])    a. 07/2015 Echo: EF 45-50%, mod LVH, mid apical/antsept AK, Gr 1 DD.  [name] CKD (chronic kidney disease), stage III ([place])   . Coronary artery disease    a. 2012 s/p MI/PCI: s/p DES to mid/dist RCA and overlapping DES to LAD;  b. 07/2015 Cath: LAD 10% ISR, D1 40(jailed), LCX 87m, OM2 30, OM3 30, RCA 76m ISR, 10d ISR; c. 07/2016 NSTEMI/PCI: LAD 85ost/p/m (3.5x20 Synergy DES overlapping prior stent), D1 40, LCX 13m, OM2 95 (2.75x16 Synergy DES), OM3 30, RCA 94m, 10d.  . Depression   . Diabetic gastroparesis ([place])   . DKA (diabetic ketoacidoses) ([place]) 03/14/2018  . GERD (gastroesophageal reflux disease)   . Gout   . Heart murmur   . Hyperlipemia   . Hypertensive heart  disease   . Ischemic cardiomyopathy    a. 07/2015 Echo: EF 45-50%, mod LVH, mid-apicalanteroseptal AK, Gr1 DD.  . Morbid obesity ([place])   . OSA on CPAP   . Osteomyelitis ([place])    a. 07/2016 MTP joint of R great toe s/p transmetatarsal amputation.  . Polyneuropathy in diabetes(357.2)   . Pulmonary sarcoidosis ([place])    "problems w/it years ago; no problems anymore" (07/03/2014)  . Rectal bleeding   . Type II diabetes mellitus ([facility])     Medications:  Infusions:  . heparin 1,300 Units/hr (04/07/19 1626)    Assessment: 63 yo M admitted with shortness of breath and chest pain. Pharmacy consulted to dose heparin for ACS/NSTEMI. No oral anticoagulants noted PTA on med rec.   Heparin level remains within goal range this AM. No bleeding noted per RN.  CBC fairly stable.  Goal of Therapy:  Heparin level 0.3-0.7 units/ml Monitor platelets by anticoagulation protocol: Yes  Plan:  Continue IV heparin at current rate. Daily heparin level and CBC Check CBC now. F/u plans for heparin after cath on Monday.  Marguerite Olea, [facility] Clinical Pharmacist Phone [phone]  04/08/2019 9:14 AM

## 2019-04-08 NOTE — Progress Notes (Signed)
PROGRESS NOTE    Joshua [name]  VQM:[id number] DOB: 23-May-1956 DOA: 04/06/2019 PCP: [name], MD      Brief Narrative:  [name] is a 63 y.o. M with MO, OSA on CPAP, CKD baseline Cr 2.0, CAD with angina, IDDM, sarcoidosis, and sdCHF who presented with accelerating angina.    Admitted by Cardiology, started on heparin.  Hospitalist service were consulted for medical management of IDDM, chronic renal disease.     Assessment & Plan:  Diabetes Glucoses 400 on admission (corresponds to his typical, given A1c of 15%).  He appears to use insulin in a  sporadic and unsystematic manner.  Overnight after starting Lantus/Novolog regimen, sugars appear better controlled but fasting sugar this morning still >170. -Increase Lantus -Increase mealtime Novolog, continue SSI corrections  -I will monitor and adjust -DM coordinator consulted, appreciate cares, will anticipate transition to insurance covered basal bolus regimen at d/c rather than 70/30    CKD stage III Diabetic nephropathy.  Baseline may be Cr ~2.0 mg/dL since last Dec.  Currently stable.  Follows with [name].  On Lasix, no ACEi. -Avoid NSAIDs -Judicious use of contrast, with IV fluids if needed   NSTE-ACS -Per Cardiology       MDM and disposition: The below labs and imaging reports were reviewed and summarized above.  Medication management as above.  The patient was admitted with NSTEMI, severe hyperglycemia from poor adherence to insulin regimen.           Subjective: No complaints.  No urinary symptoms, polyuria.  No dry mouth.  No headahce, blurry vision, nor sweats, tremor, shakes.   Objective: Vitals:   04/07/19 0730 04/07/19 1109 04/07/19 2039 04/08/19 0628  BP: 137/77 (!) 148/79 132/79 (!) 156/96  Pulse:   66 77  Resp:   18   Temp:   98.1 F (36.7 C) 98.4 F (36.9 C)  TempSrc:   Oral Oral  SpO2:   99% 99%  Weight:    (!) 136.9 kg  Height:        Intake/Output Summary (Last 24  hours) at 04/08/2019 9326 Last data filed at 04/08/2019 7124 Gross per 24 hour  Intake 720 ml  Output 2050 ml  Net -1330 ml   Filed Weights   04/06/19 2342 04/07/19 0542 04/08/19 0628  Weight: (!) 140.8 kg (!) 140.2 kg (!) 136.9 kg    Examination: General appearance:  adult male, alert and in no acute distress.  Sitting on edge of bed HEENT: Anicteric, conjunctiva pink, lids and lashes normal. No nasal deformity, discharge, epistaxis.  Lips moist, dentition normal, OP moist, no oral leasions, hearing normal.   Skin: Warm and dry.  No suspicious rashes or lesions. Cardiac: RRR, nl S1-S2, no murmurs appreciated.  Capillary refill is brisk.  No LE edema.  Radial pulses 2+ and symmetric. Respiratory: Normal respiratory rate and rhythm.  CTAB without rales or wheezes. Abdomen: Abdomen soft.  no TTP. No ascites, distension, hepatosplenomegaly.   MSK: No deformities or effusions.  Right metatarsal amp Neuro: Awake and alert.  EOMI, moves all extremities. Speech fluent.    Psych: Sensorium intact and responding to questions, attention normal. Affect normal.  Judgment and insight appear normal.    Data Reviewed: I have personally reviewed following labs and imaging studies:  CBC: Recent Labs  Lab 04/06/19 1857 04/07/19 1541 04/08/19 0302  WBC 7.3 5.6 5.9  NEUTROABS 4.2  --   --   HGB 12.2* 12.5* 11.3*  HCT 38.3* 38.1*  35.0*  MCV 86.8 85.8 85.0  PLT 186 168 528   Basic Metabolic Panel: Recent Labs  Lab 04/06/19 1857 04/07/19 1132  NA 134* 134*  K 3.9 3.9  CL 102 99  CO2 24 23  GLUCOSE 191* 445*  BUN 36* 32*  CREATININE 2.01* 1.96*  CALCIUM 8.7* 9.3   GFR: Estimated Creatinine Clearance: 55.1 mL/min (A) (by C-G formula based on SCr of 1.96 mg/dL (H)). Liver Function Tests: Recent Labs  Lab 04/06/19 1857  AST 21  ALT 16  ALKPHOS 107  BILITOT 0.5  PROT 7.4  ALBUMIN 3.7   No results for input(s): LIPASE, AMYLASE in the last 168 hours. No results for input(s):  AMMONIA in the last 168 hours. Coagulation Profile: Recent Labs  Lab 04/06/19 2115  INR 0.9   Cardiac Enzymes: Recent Labs  Lab 04/06/19 1857  TROPONINI 0.47*   BNP (last 3 results) No results for input(s): PROBNP in the last 8760 hours. HbA1C: Recent Labs    04/07/19 1541  HGBA1C 15.0*   CBG: Recent Labs  Lab 04/07/19 1103 04/07/19 1430 04/07/19 1605 04/07/19 2106 04/08/19 0728  GLUCAP 438* 351* 297* 212* 178*   Lipid Profile: No results for input(s): CHOL, HDL, LDLCALC, TRIG, CHOLHDL, LDLDIRECT in the last 72 hours. Thyroid Function Tests: Recent Labs    04/07/19 0614  TSH 1.823  FREET4 1.19   Anemia Panel: No results for input(s): VITAMINB12, FOLATE, FERRITIN, TIBC, IRON, RETICCTPCT in the last 72 hours. Urine analysis:    Component Value Date/Time   COLORURINE YELLOW 03/14/2018 2145   APPEARANCEUR HAZY (A) 03/14/2018 2145   LABSPEC 1.018 03/14/2018 2145   PHURINE 5.0 03/14/2018 2145   GLUCOSEU >=500 (A) 03/14/2018 2145   HGBUR SMALL (A) 03/14/2018 2145   BILIRUBINUR NEGATIVE 03/14/2018 2145   KETONESUR 5 (A) 03/14/2018 2145   PROTEINUR 100 (A) 03/14/2018 2145   UROBILINOGEN 0.2 11/01/2011 1410   NITRITE NEGATIVE 03/14/2018 2145   LEUKOCYTESUR NEGATIVE 03/14/2018 2145   Sepsis Labs: $[email or web address]'[id number]$ (procalcitonin:4,lacticacidven:4)  )No results found for this or any previous visit (from the past 240 hour(s)).       Radiology Studies: Dg Chest 2 View  Result Date: 04/06/2019 CLINICAL DATA:  63 year old male with history of shortness of breath for week. Bilateral leg swelling. EXAM: CHEST - 2 VIEW COMPARISON:  Chest x-ray 03/14/2018. FINDINGS: Scattered linear opacities throughout the mid lungs bilaterally, similar to prior examinations, most compatible with mild scarring. Lung volumes are normal. No consolidative airspace disease. No pleural effusions. No pneumothorax. No pulmonary nodule or mass noted. Pulmonary vasculature and the cardiomediastinal  silhouette are within normal limits. IMPRESSION: No radiographic evidence of acute cardiopulmonary disease. Electronically Signed   By: Vinnie Langton M.D.   On: 04/06/2019 18:52        Scheduled Meds: . allopurinol  300 mg Oral Daily  . amLODipine  10 mg Oral Daily  . aspirin EC  81 mg Oral Daily  . hydrALAZINE  50 mg Oral TID  . insulin aspart  0-20 Units Subcutaneous TID WC  . insulin aspart  0-5 Units Subcutaneous QHS  . insulin aspart  6 Units Subcutaneous TID WC  . insulin glargine  20 Units Subcutaneous BID  . isosorbide mononitrate  120 mg Oral BID  . living well with diabetes book   Does not apply Once  . metoprolol succinate  150 mg Oral Daily  . pantoprazole  40 mg Oral BID AC  . rosuvastatin  40 mg Oral Daily  .  ticagrelor  90 mg Oral BID   Continuous Infusions: . heparin 1,300 Units/hr (04/07/19 1626)     LOS: 2 days    Time spent: 15 minutes    Edwin Dada, MD [facility] 04/08/2019, 9:37 AM     Please page through [place]:  [email or web address] Password [email or web address] If 7PM-7AM, please contact night-coverage

## 2019-04-09 ENCOUNTER — Telehealth: Payer: Self-pay | Admitting: Physician Assistant

## 2019-04-09 ENCOUNTER — Encounter (HOSPITAL_COMMUNITY)
Admission: EM | Disposition: A | Discharge status: Home / Self Care | Payer: Self-pay | Source: Home / Self Care | Attending: Interventional Cardiology

## 2019-04-09 DIAGNOSIS — N183 Chronic kidney disease, stage 3 (moderate): Secondary | ICD-10-CM

## 2019-04-09 DIAGNOSIS — N179 Acute kidney failure, unspecified: Secondary | ICD-10-CM

## 2019-04-09 DIAGNOSIS — I2510 Atherosclerotic heart disease of native coronary artery without angina pectoris: Secondary | ICD-10-CM

## 2019-04-09 DIAGNOSIS — E785 Hyperlipidemia, unspecified: Secondary | ICD-10-CM

## 2019-04-09 HISTORY — PX: CORONARY STENT INTERVENTION: CATH118234

## 2019-04-09 HISTORY — PX: LEFT HEART CATH AND CORONARY ANGIOGRAPHY: CATH118249

## 2019-04-09 LAB — BASIC METABOLIC PANEL
Anion gap: 8 (ref 5–15)
BUN: 24 mg/dL — ABNORMAL HIGH (ref 6–20)
CO2: 25 mmol/L (ref 22–32)
Calcium: 9 mg/dL (ref 8.9–10.3)
Chloride: 107 mmol/L (ref 98–111)
Creatinine, Ser: 1.94 mg/dL — ABNORMAL HIGH (ref 0.61–1.24)
GFR calc Af Amer: 42 mL/min — ABNORMAL LOW (ref >60)
GFR calc non Af Amer: 37 mL/min — ABNORMAL LOW (ref >60)
Glucose, Bld: 151 mg/dL — ABNORMAL HIGH (ref 70–99)
Potassium: 3.5 mmol/L (ref 3.5–5.1)
Sodium: 140 mmol/L (ref 135–145)

## 2019-04-09 LAB — CBC
HCT: 36.9 % — ABNORMAL LOW (ref 39.0–52.0)
Hemoglobin: 11.8 g/dL — ABNORMAL LOW (ref 13.0–17.0)
MCH: 27.4 pg (ref 26.0–34.0)
MCHC: 32 g/dL (ref 30.0–36.0)
MCV: 85.8 fL (ref 80.0–100.0)
Platelets: 177 10*3/uL (ref 150–400)
RBC: 4.3 MIL/uL (ref 4.22–5.81)
RDW: 15 % (ref 11.5–15.5)
WBC: 5.2 10*3/uL (ref 4.0–10.5)
nRBC: 0 % (ref 0.0–0.2)

## 2019-04-09 LAB — GLUCOSE, CAPILLARY
Glucose-Capillary: 134 mg/dL — ABNORMAL HIGH (ref 70–99)
Glucose-Capillary: 175 mg/dL — ABNORMAL HIGH (ref 70–99)
Glucose-Capillary: 142 mg/dL — ABNORMAL HIGH (ref 70–99)
Glucose-Capillary: 157 mg/dL — ABNORMAL HIGH (ref 70–99)
Glucose-Capillary: 133 mg/dL — ABNORMAL HIGH (ref 70–99)

## 2019-04-09 LAB — HEPARIN LEVEL (UNFRACTIONATED): Heparin Unfractionated: 0.56 IU/mL (ref 0.30–0.70)

## 2019-04-09 LAB — POCT ACTIVATED CLOTTING TIME: Activated Clotting Time: 362 s

## 2019-04-09 SURGERY — LEFT HEART CATH AND CORONARY ANGIOGRAPHY
Anesthesia: LOCAL

## 2019-04-09 MED ORDER — VERAPAMIL HCL 2.5 MG/ML IV SOLN
INTRAVENOUS | Status: AC
  Filled 2019-04-09: qty 2

## 2019-04-09 MED ORDER — SODIUM CHLORIDE 0.9% FLUSH: 3.0000 mL | INTRAVENOUS | Status: DC | PRN

## 2019-04-09 MED ORDER — INSULIN GLARGINE 100 UNIT/ML ~~LOC~~ SOLN
20.0000 [IU] | Freq: Two times a day (BID) | SUBCUTANEOUS | Status: DC
  Administered 2019-04-09 – 2019-04-10 (×2): 20 [IU] via SUBCUTANEOUS
  Filled 2019-04-09 (×6): qty 0.2

## 2019-04-09 MED ORDER — ASPIRIN 81 MG PO CHEW
81.0000 mg | CHEWABLE_TABLET | Freq: Every day | ORAL | Status: DC
  Administered 2019-04-10: 81 mg via ORAL
  Filled 2019-04-09: qty 1

## 2019-04-09 MED ORDER — HEPARIN SODIUM (PORCINE) 1000 UNIT/ML IJ SOLN
INTRAMUSCULAR | Status: AC
  Filled 2019-04-09: qty 1

## 2019-04-09 MED ORDER — LABETALOL HCL 5 MG/ML IV SOLN: 10.0000 mg | INTRAVENOUS | Status: AC | PRN

## 2019-04-09 MED ORDER — INSULIN ASPART 100 UNIT/ML ~~LOC~~ SOLN: 6.0000 [IU] | Freq: Three times a day (TID) | SUBCUTANEOUS | 11 refills | Status: DC

## 2019-04-09 MED ORDER — ONDANSETRON HCL 4 MG/2ML IJ SOLN: 4.0000 mg | Freq: Four times a day (QID) | INTRAMUSCULAR | Status: DC | PRN

## 2019-04-09 MED ORDER — INSULIN GLARGINE 100 UNIT/ML ~~LOC~~ SOLN: 20.0000 [IU] | Freq: Two times a day (BID) | SUBCUTANEOUS | 11 refills | Status: DC

## 2019-04-09 MED ORDER — LIDOCAINE HCL (PF) 1 % IJ SOLN
INTRAMUSCULAR | Status: DC | PRN
  Administered 2019-04-09: 2 mL

## 2019-04-09 MED ORDER — INSULIN ASPART 100 UNIT/ML ~~LOC~~ SOLN
6.0000 [IU] | Freq: Three times a day (TID) | SUBCUTANEOUS | Status: DC
  Administered 2019-04-09 – 2019-04-10 (×4): 6 [IU] via SUBCUTANEOUS

## 2019-04-09 MED ORDER — FENTANYL CITRATE (PF) 100 MCG/2ML IJ SOLN
INTRAMUSCULAR | Status: DC | PRN
  Administered 2019-04-09: 25 ug via INTRAVENOUS

## 2019-04-09 MED ORDER — MIDAZOLAM HCL 2 MG/2ML IJ SOLN
INTRAMUSCULAR | Status: DC | PRN
  Administered 2019-04-09: 2 mg via INTRAVENOUS

## 2019-04-09 MED ORDER — IOHEXOL 350 MG/ML SOLN
INTRAVENOUS | Status: DC | PRN
  Administered 2019-04-09: 70 mL via INTRA_ARTERIAL

## 2019-04-09 MED ORDER — VERAPAMIL HCL 2.5 MG/ML IV SOLN
INTRAVENOUS | Status: DC | PRN
  Administered 2019-04-09: 10 mL via INTRA_ARTERIAL

## 2019-04-09 MED ORDER — MIDAZOLAM HCL 2 MG/2ML IJ SOLN
INTRAMUSCULAR | Status: AC
  Filled 2019-04-09: qty 2

## 2019-04-09 MED ORDER — TICAGRELOR 90 MG PO TABS
90.0000 mg | ORAL_TABLET | Freq: Two times a day (BID) | ORAL | Status: DC
  Administered 2019-04-10 (×2): 90 mg via ORAL
  Filled 2019-04-09 (×2): qty 1

## 2019-04-09 MED ORDER — FENTANYL CITRATE (PF) 100 MCG/2ML IJ SOLN
INTRAMUSCULAR | Status: AC
  Filled 2019-04-09: qty 2

## 2019-04-09 MED ORDER — HEPARIN (PORCINE) IN NACL 1000-0.9 UT/500ML-% IV SOLN
INTRAVENOUS | Status: AC
  Filled 2019-04-09: qty 1000

## 2019-04-09 MED ORDER — LIDOCAINE HCL (PF) 1 % IJ SOLN
INTRAMUSCULAR | Status: AC
  Filled 2019-04-09: qty 30

## 2019-04-09 MED ORDER — ACETAMINOPHEN 325 MG PO TABS: 650.0000 mg | ORAL_TABLET | ORAL | Status: DC | PRN

## 2019-04-09 MED ORDER — SODIUM CHLORIDE 0.9 % IV SOLN: INTRAVENOUS | Status: AC

## 2019-04-09 MED ORDER — NITROGLYCERIN 1 MG/10 ML FOR IR/CATH LAB
INTRA_ARTERIAL | Status: AC
  Filled 2019-04-09: qty 10

## 2019-04-09 MED ORDER — BLOOD GLUCOSE METER KIT: PACK | 0 refills | Status: DC

## 2019-04-09 MED ORDER — SODIUM CHLORIDE 0.9 % IV SOLN: 250.0000 mL | INTRAVENOUS | Status: DC | PRN

## 2019-04-09 MED ORDER — HEPARIN SODIUM (PORCINE) 1000 UNIT/ML IJ SOLN
INTRAMUSCULAR | Status: DC | PRN
  Administered 2019-04-09: 6000 [IU] via INTRAVENOUS
  Administered 2019-04-09: 8000 [IU] via INTRAVENOUS

## 2019-04-09 MED ORDER — INSULIN GLARGINE 100 UNIT/ML ~~LOC~~ SOLN
10.0000 [IU] | Freq: Once | SUBCUTANEOUS | Status: DC
  Filled 2019-04-09: qty 0.1

## 2019-04-09 MED ORDER — HEPARIN (PORCINE) IN NACL 1000-0.9 UT/500ML-% IV SOLN
INTRAVENOUS | Status: DC | PRN
  Administered 2019-04-09 (×2): 500 mL

## 2019-04-09 MED ORDER — HYDRALAZINE HCL 20 MG/ML IJ SOLN: 10.0000 mg | INTRAMUSCULAR | Status: AC | PRN

## 2019-04-09 MED ORDER — SODIUM CHLORIDE 0.9% FLUSH: 3.0000 mL | Freq: Two times a day (BID) | INTRAVENOUS | Status: DC

## 2019-04-09 SURGICAL SUPPLY — 16 items
BALLN SAPPHIRE ~~LOC~~ 4.0X15 (BALLOONS) ×1 IMPLANT
BALLN WOLVERINE 3.75X10 (BALLOONS) ×2
BALLOON WOLVERINE 3.75X10 (BALLOONS) IMPLANT
CATH 5FR JL3.5 JR4 ANG PIG MP (CATHETERS) ×1 IMPLANT
CATH LAUNCHER 6FR EBU3.5 (CATHETERS) ×1 IMPLANT
DEVICE RAD COMP TR BAND LRG (VASCULAR PRODUCTS) ×1 IMPLANT
GLIDESHEATH SLEND SS 6F .021 (SHEATH) ×1 IMPLANT
GUIDEWIRE INQWIRE 1.5J.035X260 (WIRE) IMPLANT
INQWIRE 1.5J .035X260CM (WIRE) ×2
KIT ENCORE 26 ADVANTAGE (KITS) ×1 IMPLANT
KIT HEART LEFT (KITS) ×2 IMPLANT
KIT HEMO VALVE WATCHDOG (MISCELLANEOUS) ×1 IMPLANT
PACK CARDIAC CATHETERIZATION (CUSTOM PROCEDURE TRAY) ×2 IMPLANT
TRANSDUCER W/STOPCOCK (MISCELLANEOUS) ×2 IMPLANT
TUBING CIL FLEX 10 FLL-RA (TUBING) ×2 IMPLANT
WIRE ASAHI PROWATER 180CM (WIRE) ×1 IMPLANT

## 2019-04-09 NOTE — TOC Initial Note (Signed)
Transition of Care [facility]) - Initial/Assessment Note    Patient Details  Name: Joshua [name] MRN: [id number] Date of Birth: 03/17/1956  Transition of Care [facility]) CM/SW Contact:    Bethena Roys, RN Phone Number: 04/09/2019, 1:54 PM  Clinical Narrative:  Pt presented for NStemi- PTA from home with spouse. Pt has PCP, however he only sees the PA when he visits. Patient has Medicaid and he uses [facility] on [address] pay for medications should range from $1.50-$3.00. Patient states family only has one car, CM will provided patient with Medicaid Transportation list. No further needs from CM at this time.                  Expected Discharge Plan: Home/Self Care Barriers to Discharge: No Barriers Identified   Patient Goals and CMS Choice   CMS Medicare.gov Compare Post Acute Care list provided to:: (N/A) Choice offered to / list presented to : NA  Expected Discharge Plan and Services Expected Discharge Plan: Home/Self Care In-house Referral: NA Discharge Planning Services: CM Consult Post Acute Care Choice: NA Living arrangements for the past 2 months: Single Family Home                 DME Arranged: N/A DME Agency: NA       HH Arranged: NA HH Agency: NA        Prior Living Arrangements/Services Living arrangements for the past 2 months: Single Family Home Lives with:: Spouse Patient language and need for interpreter reviewed:: Yes Do you feel safe going back to the place where you live?: Yes      Need for Family Participation in Patient Care: Yes (Comment) Care giver support system in place?: Yes (comment) Current home services: (N/A) Criminal Activity/Legal Involvement Pertinent to Current Situation/Hospitalization: No - Comment as needed  Activities of Daily Living Home Assistive Devices/Equipment: CPAP, CBG Meter ADL Screening (condition at time of admission) Patient's cognitive ability adequate to safely complete daily activities?: Yes Is the  patient deaf or have difficulty hearing?: No Does the patient have difficulty seeing, even when wearing glasses/contacts?: No Does the patient have difficulty concentrating, remembering, or making decisions?: No Patient able to express need for assistance with ADLs?: Yes Does the patient have difficulty dressing or bathing?: No Independently performs ADLs?: Yes (appropriate for developmental age) Does the patient have difficulty walking or climbing stairs?: No Weakness of Legs: None Weakness of Arms/Hands: None  Permission Sought/Granted Permission sought to share information with : Family Supports Permission granted to share information with : No              Emotional Assessment Appearance:: Appears stated age Attitude/Demeanor/Rapport: Engaged Affect (typically observed): Accepting Orientation: : Oriented to Self, Oriented to Situation, Oriented to Place, Oriented to  Time Alcohol / Substance Use: Not Applicable Psych Involvement: No (comment)  Admission diagnosis:  NSTEMI (non-ST elevated myocardial infarction) [facility]) [I21.4] Patient Active Problem List   Diagnosis Date Noted  . NSTEMI (non-ST elevated myocardial infarction) ([place]) 04/06/2019  . ACS (acute coronary syndrome) ([place]) 04/06/2019  . Sepsis ([place]) 03/15/2018  . DKA (diabetic ketoacidoses) ([place]) 03/15/2018  . Special screening for malignant neoplasms, colon 08/22/2017  . Acute renal failure superimposed on stage 3 chronic kidney disease ([place]) 05/19/2017  . Essential hypertension   . Status post transmetatarsal amputation of foot, right ([place]) 11/08/2016  . Diabetic polyneuropathy associated with type 2 diabetes mellitus ([place]) 11/08/2016  . Pure hypercholesterolemia   . AKI (acute kidney  injury) ([place])   . Acute blood loss anemia   . DM type 2 with diabetic peripheral neuropathy ([place])   . Physical debility 08/03/2016  . Chronic osteomyelitis of right foot ([place])   . Cellulitis and abscess of leg, except foot   .  Penetrating wound of right foot   . CKD (chronic kidney disease) stage 3, GFR 30-59 ml/min (H[facility])   . Unstable angina pectoris ([place]) 07/18/2016  . Chronic combined systolic and diastolic congestive heart failure ([place])   . Chest pain with moderate risk for cardiac etiology 06/24/2014  . CAD S/P multiple PCIs 06/24/2014  . Sleep apnea- on C-pap 06/24/2014  . Morbid obesity due to excess calories ([place]) 11/10/2013  . Hyperglycemia 10/29/2013  . Hyperlipidemia 01/12/2011  . Benign essential HTN 01/12/2011  . GERD 01/12/2011   PCP:  [name], MD Pharmacy:   [facility] [place] [address] Phone: [phone] Fax: [phone]     Social Determinants of Health (SDOH) Interventions    Readmission Risk Interventions No flowsheet data found.

## 2019-04-09 NOTE — Telephone Encounter (Signed)
   TELEPHONE CALL NOTE  This patient has been deemed a candidate for follow-up tele-health visit to limit community exposure during the Covid-19 pandemic. I spoke with the patient via phone to discuss instructions. This has been outlined on the patient's AVS (dotphrase: hcevisitinfo). The patient was advised to review the section on consent for treatment as well. The patient will receive a phone call 2-3 days prior to their E-Visit at which time consent will be verbally confirmed.   A Virtual Office Visit appointment type has been scheduled for hospital follow up with Dr. Claiborne Billings (conver 3 months follow up to Virtual visit), with "VIDEO" or "TELEPHONE" in the appointment notes - patient prefers VIDEO type.  I have either confirmed the patient is active in MyChart or offered to send sign-up link to phone/email via Mychart icon beside patient's photo.  [place], [place] 04/09/2019 5:25 PM

## 2019-04-09 NOTE — Progress Notes (Signed)
Progress Note  Patient Name: Joshua [name] Date of Encounter: 04/09/2019  Primary Cardiologist: [name], MD   Subjective    Better today.  Denies chest pain or shortness of breath at rest.  Inpatient Medications    Scheduled Meds: . allopurinol  300 mg Oral Daily  . amLODipine  10 mg Oral Daily  . aspirin EC  81 mg Oral Daily  . hydrALAZINE  50 mg Oral TID  . insulin aspart  0-15 Units Subcutaneous TID WC  . insulin aspart  0-5 Units Subcutaneous QHS  . insulin aspart  8 Units Subcutaneous TID WC  . insulin glargine  22 Units Subcutaneous BID  . isosorbide mononitrate  120 mg Oral BID  . living well with diabetes book   Does not apply Once  . metoprolol succinate  150 mg Oral Daily  . pantoprazole  40 mg Oral BID AC  . rosuvastatin  40 mg Oral Daily  . sodium chloride flush  3 mL Intravenous Q12H  . ticagrelor  90 mg Oral BID   Continuous Infusions: . sodium chloride    . sodium chloride    . heparin 1,300 Units/hr (04/08/19 1335)   PRN Meds: sodium chloride, acetaminophen, metoCLOPramide, nitroGLYCERIN, ondansetron (ZOFRAN) IV, sodium chloride flush   Vital Signs    Vitals:   04/08/19 0628 04/08/19 1405 04/08/19 2004 04/09/19 0413  BP: (!) 156/96 130/75 (!) 144/80 132/84  Pulse: 77 64 63 71  Resp:   17 15  Temp: 98.4 F (36.9 C) 97.6 F (36.4 C) 98.1 F (36.7 C) 98.6 F (37 C)  TempSrc: Oral Oral Oral Oral  SpO2: 99% 99% 99% 99%  Weight: (!) 136.9 kg   (!) 137.9 kg  Height:        Intake/Output Summary (Last 24 hours) at 04/09/2019 0803 Last data filed at 04/09/2019 0610 Gross per 24 hour  Intake 720 ml  Output 1985 ml  Net -1265 ml   Last 3 Weights 04/09/2019 04/08/2019 04/07/2019  Weight (lbs) 304 lb 1.6 oz 301 lb 14.4 oz 309 lb  Weight (kg) 137.939 kg 136.941 kg 140.161 kg      Telemetry    Normal sinus rhythm - Personally Reviewed  ECG    Normal sinus rhythm with age-indeterminate anteroseptal infarct- Personally Reviewed   Physical Exam  Alert, oriented obese male in no distress GEN: No acute distress.   Neck: No JVD Cardiac: RRR, no murmurs, rubs, or gallops.  Respiratory: Clear to auscultation bilaterally. GI: Soft, nontender, non-distended  MS:  1+ edema right pretibial region; No deformity. Neuro:  Nonfocal  Psych: Normal affect   Labs    Chemistry Recent Labs  Lab 04/06/19 1857 04/07/19 1132 04/09/19 0437  NA 134* 134* 140  K 3.9 3.9 3.5  CL 102 99 107  CO2 $Re'24 23 25  'cLx$ GLUCOSE 191* 445* 151*  BUN 36* 32* 24*  CREATININE 2.01* 1.96* 1.94*  CALCIUM 8.7* 9.3 9.0  PROT 7.4  --   --   ALBUMIN 3.7  --   --   AST 21  --   --   ALT 16  --   --   ALKPHOS 107  --   --   BILITOT 0.5  --   --   GFRNONAA 35* 36* 37*  GFRAA 41* 42* 42*  ANIONGAP $RemoveB'8 12 8     'EpRdsTAF$ Hematology Recent Labs  Lab 04/07/19 1541 04/08/19 0302 04/09/19 0437  WBC 5.6 5.9 5.2  RBC 4.44 4.12* 4.30  HGB 12.5* 11.3* 11.8*  HCT 38.1* 35.0* 36.9*  MCV 85.8 85.0 85.8  MCH 28.2 27.4 27.4  MCHC 32.8 32.3 32.0  RDW 14.9 14.8 15.0  PLT 168 165 177    Cardiac Enzymes Recent Labs  Lab 04/06/19 1857  TROPONINI 0.47*   No results for input(s): TROPIPOC in the last 168 hours.   BNP Recent Labs  Lab 04/06/19 1857  BNP 76.7     DDimer No results for input(s): DDIMER in the last 168 hours.   Radiology    No results found.  Cardiac Studies   Echo 04/07/2019 IMPRESSIONS    1. The left ventricle has normal systolic function, with an ejection fraction of 55-60%. The cavity size was normal. Indeterminate diastolic filling due to E-A fusion.  2. LV regional wall motion abnormalities could not be completely assessed due to suboptimal acoustic windows. Grossly normal wall motion.  3. The inferior vena cava was dilated in size with <50% respiratory variability.  Patient Profile     Joshua [name] a 63 y.o.malewith PMH ofCAD s/p multiple stents, CKD stage III, DM 2 with history of DKA,HTN,HLD,OSAon CPAP and  history of pulmonary sarcoidosis presents with chest pain and shortness of breath.   Assessment & Plan    1. NSTEMI - Troponin peaked at 0.6 ( high than prior admission). Treated with IV heparin. Chest pain free since admit. Echo with preserved LV function this admission.  Plan cath today. Continue ASA, statin and BB.   2.Uncontrolled Type 2 DM - Appreciate Family medicine recommendations. Seems poor insight. A1c 15 this admission.   3. Acute on chronic kidney disease III - Scr trending down since admit 2.01>>1.96>>1.94. Seems baseline around 1.7-1.9 however last year it was > 4 one time. Likely related to uncontrolled DM.  4. HLD - No results found for requested labs within last 8760 hours.  - Continue Crestor $RemoveBeforeDE'40mg'ymgmxfQdyPspGme$  daily - Check lipids in AM  5. HTN - BP improving on current medications  For questions or updates, please contact [name] Please consult [email or web address] for contact info under     SignedLeanor Kail, PA  04/09/2019, 8:03 AM    Patient seen, examined. Available data reviewed. Agree with findings, assessment, and plan as outlined by [name], PA.  The physical exam findings documented above reflect my personal findings.  Plans for cardiac catheterization today as outlined. I have reviewed the risks, indications, and alternatives to cardiac catheterization, possible angioplasty, and stenting with the patient. Risks include but are not limited to bleeding, infection, vascular injury, stroke, myocardial infection, arrhythmia, kidney injury, radiation-related injury in the case of prolonged fluoroscopy use, emergency cardiac surgery, and death. The patient understands the risks of serious complication is 1-2 in 5366 with diagnostic cardiac cath and 1-2% or less with angioplasty/stenting.  The patient describes both progressive symptoms of heart failure and angina which were initially responsive to nitroglycerin, but became increasingly worse.  Troponin noted to be  mildly elevated.  The patient has stage III chronic kidney disease which appears to be stable.  Further plan/disposition pending his cardiac catheterization results.  I discussed with him that focusing on his diabetes care and lifestyle modification will be critical components to his long-term outlook.  Sherren Mocha, M.D. 04/09/2019 10:50 AM

## 2019-04-09 NOTE — H&P (View-Only) (Signed)
Progress Note  Patient Name: Joshua [name] Date of Encounter: 04/09/2019  Primary Cardiologist: [name], MD   Subjective    Better today.  Denies chest pain or shortness of breath at rest.  Inpatient Medications    Scheduled Meds: . allopurinol  300 mg Oral Daily  . amLODipine  10 mg Oral Daily  . aspirin EC  81 mg Oral Daily  . hydrALAZINE  50 mg Oral TID  . insulin aspart  0-15 Units Subcutaneous TID WC  . insulin aspart  0-5 Units Subcutaneous QHS  . insulin aspart  8 Units Subcutaneous TID WC  . insulin glargine  22 Units Subcutaneous BID  . isosorbide mononitrate  120 mg Oral BID  . living well with diabetes book   Does not apply Once  . metoprolol succinate  150 mg Oral Daily  . pantoprazole  40 mg Oral BID AC  . rosuvastatin  40 mg Oral Daily  . sodium chloride flush  3 mL Intravenous Q12H  . ticagrelor  90 mg Oral BID   Continuous Infusions: . sodium chloride    . sodium chloride    . heparin 1,300 Units/hr (04/08/19 1335)   PRN Meds: sodium chloride, acetaminophen, metoCLOPramide, nitroGLYCERIN, ondansetron (ZOFRAN) IV, sodium chloride flush   Vital Signs    Vitals:   04/08/19 0628 04/08/19 1405 04/08/19 2004 04/09/19 0413  BP: (!) 156/96 130/75 (!) 144/80 132/84  Pulse: 77 64 63 71  Resp:   17 15  Temp: 98.4 F (36.9 C) 97.6 F (36.4 C) 98.1 F (36.7 C) 98.6 F (37 C)  TempSrc: Oral Oral Oral Oral  SpO2: 99% 99% 99% 99%  Weight: (!) 136.9 kg   (!) 137.9 kg  Height:        Intake/Output Summary (Last 24 hours) at 04/09/2019 0803 Last data filed at 04/09/2019 0610 Gross per 24 hour  Intake 720 ml  Output 1985 ml  Net -1265 ml   Last 3 Weights 04/09/2019 04/08/2019 04/07/2019  Weight (lbs) 304 lb 1.6 oz 301 lb 14.4 oz 309 lb  Weight (kg) 137.939 kg 136.941 kg 140.161 kg      Telemetry    Normal sinus rhythm - Personally Reviewed  ECG    Normal sinus rhythm with age-indeterminate anteroseptal infarct- Personally Reviewed   Physical Exam  Alert, oriented obese male in no distress GEN: No acute distress.   Neck: No JVD Cardiac: RRR, no murmurs, rubs, or gallops.  Respiratory: Clear to auscultation bilaterally. GI: Soft, nontender, non-distended  MS:  1+ edema right pretibial region; No deformity. Neuro:  Nonfocal  Psych: Normal affect   Labs    Chemistry Recent Labs  Lab 04/06/19 1857 04/07/19 1132 04/09/19 0437  NA 134* 134* 140  K 3.9 3.9 3.5  CL 102 99 107  CO2 $Re'24 23 25  'OxS$ GLUCOSE 191* 445* 151*  BUN 36* 32* 24*  CREATININE 2.01* 1.96* 1.94*  CALCIUM 8.7* 9.3 9.0  PROT 7.4  --   --   ALBUMIN 3.7  --   --   AST 21  --   --   ALT 16  --   --   ALKPHOS 107  --   --   BILITOT 0.5  --   --   GFRNONAA 35* 36* 37*  GFRAA 41* 42* 42*  ANIONGAP $RemoveB'8 12 8     'QGRIxUnv$ Hematology Recent Labs  Lab 04/07/19 1541 04/08/19 0302 04/09/19 0437  WBC 5.6 5.9 5.2  RBC 4.44 4.12* 4.30  HGB 12.5* 11.3* 11.8*  HCT 38.1* 35.0* 36.9*  MCV 85.8 85.0 85.8  MCH 28.2 27.4 27.4  MCHC 32.8 32.3 32.0  RDW 14.9 14.8 15.0  PLT 168 165 177    Cardiac Enzymes Recent Labs  Lab 04/06/19 1857  TROPONINI 0.47*   No results for input(s): TROPIPOC in the last 168 hours.   BNP Recent Labs  Lab 04/06/19 1857  BNP 76.7     DDimer No results for input(s): DDIMER in the last 168 hours.   Radiology    No results found.  Cardiac Studies   Echo 04/07/2019 IMPRESSIONS    1. The left ventricle has normal systolic function, with an ejection fraction of 55-60%. The cavity size was normal. Indeterminate diastolic filling due to E-A fusion.  2. LV regional wall motion abnormalities could not be completely assessed due to suboptimal acoustic windows. Grossly normal wall motion.  3. The inferior vena cava was dilated in size with <50% respiratory variability.  Patient Profile     Joshua [name] a 63 y.o.malewith PMH ofCAD s/p multiple stents, CKD stage III, DM 2 with history of DKA,HTN,HLD,OSAon CPAP and  history of pulmonary sarcoidosis presents with chest pain and shortness of breath.   Assessment & Plan    1. NSTEMI - Troponin peaked at 0.6 ( high than prior admission). Treated with IV heparin. Chest pain free since admit. Echo with preserved LV function this admission.  Plan cath today. Continue ASA, statin and BB.   2.Uncontrolled Type 2 DM - Appreciate Family medicine recommendations. Seems poor insight. A1c 15 this admission.   3. Acute on chronic kidney disease III - Scr trending down since admit 2.01>>1.96>>1.94. Seems baseline around 1.7-1.9 however last year it was > 4 one time. Likely related to uncontrolled DM.  4. HLD - No results found for requested labs within last 8760 hours.  - Continue Crestor $RemoveBeforeDE'40mg'LQxwWQoNsfhPRbr$  daily - Check lipids in AM  5. HTN - BP improving on current medications  For questions or updates, please contact [place] Please consult [email or web address] for contact info under     SignedLeanor Kail, PA  04/09/2019, 8:03 AM    Patient seen, examined. Available data reviewed. Agree with findings, assessment, and plan as outlined by [name], PA.  The physical exam findings documented above reflect my personal findings.  Plans for cardiac catheterization today as outlined. I have reviewed the risks, indications, and alternatives to cardiac catheterization, possible angioplasty, and stenting with the patient. Risks include but are not limited to bleeding, infection, vascular injury, stroke, myocardial infection, arrhythmia, kidney injury, radiation-related injury in the case of prolonged fluoroscopy use, emergency cardiac surgery, and death. The patient understands the risks of serious complication is 1-2 in 3790 with diagnostic cardiac cath and 1-2% or less with angioplasty/stenting.  The patient describes both progressive symptoms of heart failure and angina which were initially responsive to nitroglycerin, but became increasingly worse.  Troponin noted to be  mildly elevated.  The patient has stage III chronic kidney disease which appears to be stable.  Further plan/disposition pending his cardiac catheterization results.  I discussed with him that focusing on his diabetes care and lifestyle modification will be critical components to his long-term outlook.  Sherren Mocha, M.D. 04/09/2019 10:50 AM

## 2019-04-09 NOTE — Progress Notes (Signed)
Attempt to deflate TR Band by Sam RN. 3cc removed site began slow ooze. 3cc air placed back in band. Jessie Foot, RN

## 2019-04-09 NOTE — Discharge Instructions (Signed)
YOUR CARDIOLOGY TEAM HAS ARRANGED FOR AN E-VISIT FOR YOUR APPOINTMENT - PLEASE REVIEW IMPORTANT INFORMATION BELOW SEVERAL DAYS PRIOR TO YOUR APPOINTMENT  Due to the recent COVID-19 pandemic, we are transitioning in-person office visits to tele-medicine visits in an effort to decrease unnecessary exposure to our patients, their families, and staff. These visits are billed to your insurance just like a normal visit is. We also encourage you to sign up for MyChart if you have not already done so. You will need a smartphone if possible. For patients that do not have this, we can still complete the visit using a regular telephone but do prefer a smartphone to enable video when possible. You may have a family member that lives with you that can help. If possible, we also ask that you have a blood pressure cuff and scale at home to measure your blood pressure, heart rate and weight prior to your scheduled appointment. Patients with clinical needs that need an in-person evaluation and testing will still be able to come to the office if absolutely necessary. If you have any questions, feel free to call our office.     YOUR PROVIDER WILL BE USING THE FOLLOWING PLATFORM TO COMPLETE YOUR VISIT:    IF USING MYCHART - How to Download the MyChart App to Your SmartPhone   - If Apple, go to [employer] and type in MyChart in the search bar and download the app. If Android, ask patient to go to [employer] and type in [facility] in the search bar and download the app. The app is free but as with any other app downloads, your phone may require you to verify saved payment information or Apple/Android password.  - You will need to then log into the app with your MyChart username and password, and select [facility] as your healthcare provider to link the account.  - When it is time for your visit, go to the MyChart app, find appointments, and click Begin Video Visit. Be sure to Select Allow for your device to access the  Microphone and Camera for your visit. You will then be connected, and your provider will be with you shortly.  **If you have any issues connecting or need assistance, please contact MyChart service desk [phone])**  **If using a computer, in order to ensure the best quality for your visit, you will need to use either of the following Internet Browsers: Insurance underwriter or Microsoft Edge**   IF USING DOXIMITY or [email or web address] - The staff will give you instructions on receiving your link to join the meeting the day of your visit.      2-3 DAYS BEFORE YOUR APPOINTMENT  You will receive a telephone call from one of our [facility] team members - your caller ID may say "Unknown caller." If this is a video visit, we will walk you through how to get the video launched on your phone. We will remind you check your blood pressure, heart rate and weight prior to your scheduled appointment. If you have an Apple Watch or Kardia, please upload any pertinent ECG strips the day before or morning of your appointment to [facility] [place] [facility]. Our staff will also make sure you have reviewed the consent and agree to move forward with your scheduled tele-health visit.     THE DAY OF YOUR APPOINTMENT  Approximately 15 minutes prior to your scheduled appointment, you will receive a telephone call from one of [facility] team - your caller ID may say "Unknown caller."  Our staff will confirm medications, vital signs for the day and any symptoms you may be experiencing. Please have this information available prior to the time of visit start. It may also be helpful for you to have a pad of paper and pen handy for any instructions given during your visit. They will also walk you through joining the smartphone meeting if this is a video visit.    CONSENT FOR TELE-HEALTH VISIT - PLEASE REVIEW  I hereby voluntarily request, consent and authorize [facility] and its employed or contracted physicians, physician assistants, nurse  practitioners or other licensed health care professionals (the Practitioner), to provide me with telemedicine health care services (the Services") as deemed necessary by the treating Practitioner. I acknowledge and consent to receive the Services by the Practitioner via telemedicine. I understand that the telemedicine visit will involve communicating with the Practitioner through live audiovisual communication technology and the disclosure of certain medical information by electronic transmission. I acknowledge that I have been given the opportunity to request an in-person assessment or other available alternative prior to the telemedicine visit and am voluntarily participating in the telemedicine visit.  I understand that I have the right to withhold or withdraw my consent to the use of telemedicine in the course of my care at any time, without affecting my right to future care or treatment, and that the Practitioner or I may terminate the telemedicine visit at any time. I understand that I have the right to inspect all information obtained and/or recorded in the course of the telemedicine visit and may receive copies of available information for a reasonable fee.  I understand that some of the potential risks of receiving the Services via telemedicine include:   Delay or interruption in medical evaluation due to technological equipment failure or disruption;  Information transmitted may not be sufficient (e.g. poor resolution of images) to allow for appropriate medical decision making by the Practitioner; and/or   In rare instances, security protocols could fail, causing a breach of personal health information.  Furthermore, I acknowledge that it is my responsibility to provide information about my medical history, conditions and care that is complete and accurate to the best of my ability. I acknowledge that Practitioner's advice, recommendations, and/or decision may be based on factors not within  their control, such as incomplete or inaccurate data provided by me or distortions of diagnostic images or specimens that may result from electronic transmissions. I understand that the practice of medicine is not an exact science and that Practitioner makes no warranties or guarantees regarding treatment outcomes. I acknowledge that I will receive a copy of this consent concurrently upon execution via email to the email address I last provided but may also request a printed copy by calling the office of [facility].    I understand that my insurance will be billed for this visit.   I have read or had this consent read to me.  I understand the contents of this consent, which adequately explains the benefits and risks of the Services being provided via telemedicine.   I have been provided ample opportunity to ask questions regarding this consent and the Services and have had my questions answered to my satisfaction.  I give my informed consent for the services to be provided through the use of telemedicine in my medical care  By participating in this telemedicine visit I agree to the above.

## 2019-04-09 NOTE — Interval H&P Note (Signed)
Cath Lab Visit (complete for each Cath Lab visit)  Clinical Evaluation Leading to the Procedure:   ACS: Yes.    Non-ACS:    Anginal Classification: CCS IV  Anti-ischemic medical therapy: Minimal Therapy (1 class of medications)  Non-Invasive Test Results: No non-invasive testing performed  Prior CABG: No previous CABG      History and Physical Interval Note:  04/09/2019 11:25 AM  Joshua [name]  has presented today for surgery, with the diagnosis of NSTEMI.  The various methods of treatment have been discussed with the patient and family. After consideration of risks, benefits and other options for treatment, the patient has consented to  Procedure(s): LEFT HEART CATH AND CORONARY ANGIOGRAPHY (N/A) as a surgical intervention.  The patient's history has been reviewed, patient examined, no change in status, stable for surgery.  I have reviewed the patient's chart and labs.  Questions were answered to the patient's satisfaction.     Larae Grooms

## 2019-04-09 NOTE — Progress Notes (Signed)
Cardiac Rehab Advisory Cardiac Rehab Phase I is not seeing pts face to face at this time due to Covid 19 restrictions. Ambulation is occurring through nursing, PT, and mobility teams. We will help facilitate that process as needed. We are calling pts in their rooms and discussing education. We will then deliver education materials to pts RN for delivery to pt.   [phone] Called pt to complete MI education by phone. Pt voiced understanding.[name] Has attended CRP 2 [place] before. Referred to [facility] again. Discussed NTG use, brilinta, MI restrictions, watching carbs and heart healthy food choices, and walking as tolerated. Pt stated he cannot walk far due to foot surgery and back pain. Encouraged pt to take his meds and he stated since he has Medicaid now, he should be able to afford. Graylon Good RN BSN 04/09/2019 2:37 PM

## 2019-04-09 NOTE — Discharge Summary (Addendum)
Discharge Summary    Patient ID: Joshua [name] MRN: [id number]; DOB: [birth year]  Admit date: 04/06/2019 Discharge date: 04/10/2019  Primary Care Provider: [name], MD  Primary Cardiologist: [name], MD   Discharge Diagnoses    Principal Problem:   NSTEMI (non-ST elevated myocardial infarction) [facility]) Active Problems:   Hyperlipidemia   Morbid obesity due to excess calories ([place])   Sleep apnea- on C-pap   Chronic combined systolic and diastolic congestive heart failure (HCC)   CKD (chronic kidney disease) stage 3, GFR 30-59 ml/min (HCC)   DM type 2 with diabetic peripheral neuropathy (HCC)   Essential hypertension   Allergies Allergies  Allergen Reactions  . Chlorthalidone Other (See Comments)    Causes dehydration, kidneys shut down    Diagnostic Studies/Procedures    Echo 04/07/2019 IMPRESSIONS   1. The left ventricle has normal systolic function, with an ejection fraction of 55-60%. The cavity size was normal. Indeterminate diastolic filling due to E-A fusion. 2. LV regional wall motion abnormalities could not be completely assessed due to suboptimal acoustic windows. Grossly normal wall motion. 3. The inferior vena cava was dilated in size with <50% respiratory variability.  CORONARY STENT INTERVENTION 04/09/2019  LEFT HEART CATH AND CORONARY ANGIOGRAPHY  Conclusion     Previously placed Mid LAD drug eluting stent is widely patent.  Ost 1st Diag to 1st Diag lesion is 40% stenosed.  Dist LAD lesion is 10% stenosed.  Previously placed 2nd Mrg drug eluting stent is widely patent.  3rd Mrg lesion is 30% stenosed.  Mid Cx lesion is 50% stenosed.  RPDA lesion is 30% stenosed.  Prox RCA to Mid RCA lesion is 40% stenosed.  Mid RCA lesion is 25% stenosed.  LV end diastolic pressure is normal.  There is no aortic valve stenosis.  Ost LAD to Prox LAD lesion is 90% stenosed. In stent restenosis.  Scoring balloon angioplasty was performed  using a BALLOON WOLVERINE 3.75X10, followed by 4.0 [place] balloon.  Prox LAD lesion is 90% stenosed.  Post intervention, there is a 0% residual stenosis.   Continue IV fluids post procedure.  He will need aggressive secondary prevention.    I stressed the importance of taking Brilinta 2x/day.  He admits to occasionally missing the second dose due to cost.  He states Plavix was stopped in the past, but he is not sure of the specifics.   Possible discharge later in the day.        History of Present Illness     Joshua [name] a 63 y.o.malewith PMH ofCAD, CKD stage III, DM 2 with history of DKA,HTN,HLD,OSAon CPAP and history of pulmonary sarcoidosis who present 04/06/2019 with 2 week history of ongoing intermittent CP with bad episode of CP 2 days ago. He has been running out of meds and has been stretching all his meds. Has more SOB with mild activity. Also with worse LEE. Bs running high 200-400. Trop elevated beyond levels observed before. BNP low.   He had anterior lateral STEMI in November 2012 requiring 3.0 x 32 mm Promus DES placed in the LAD, he subsequently had staged PCI of mid RCA with another drug-eluting stent. He had another cardiac catheterization in August 2016, LAD stent was patent, there was 40% D1 lesion, 60% lesion in left circumflex artery, 50% in-stent restenosis in the RCA. Echocardiogram obtained at the time showed EF 45 to 50% with moderate LVH, grade 1 DD. He was placed on Protonix for management of GERD. He was readmitted  in August 2017 with NSTEMI, repeat cardiac catheterization showed new proximal LAD lesion and 95% OM1 lesion. He underwent another stenting of LAD and left circumflex vessel with synergy stents. He was readmitted in October 2017 with atypical chest pain. Myoview at the time showed EF 39% without ischemia. Patient was last seen by Dr. Claiborne Billings in December 2018, at which time he denies any further anginal symptoms. Since the last office visit,  patient was admitted with DKA and severe dehydration in the setting of uncontrolled type II DM in April 2019. On arrival his creatinine was 5.25, CBG of 1241, aniongap was over 35. Hemoglobin A1c was greater than 15.5.   Seen again in December 2019, he was complaining of exertional chest discomfort for 2 months. Cardiac cath deferred due ot renal disease. Creat 2.2. Lexiscan stress done to risk stratify. Imdur increased. Myoview was obtained on 12/26/2018 which showed EF 33%, medium sized moderate intensity severe fixed septal and anteroseptal perfusion defect which could represent scar or artifact, no significant reversible ischemia noted. Overall this is a high risk study due to decreased LVEF which has been reduced from the previous study in 2017.  Hospital Course     Consultants: Family Medicine   1. NSTEMI - Troponin peaked at 0.6 ( high than prior admission). Treated with IV heparin. Remained chest pain free since admit. Echo with preserved LV function this admission. Cath showed in stent restenosis of ost to pro LAD S/p Scoring balloon angioplasty was performed using a BALLOON WOLVERINE 3.75X10, followed by 4.0 [place] balloon. Details as above. Recommended aggressive secondary prevention. He admitd to occasionally missing the second dose due to cost.  He states Plavix was stopped in the past, but he is not sure of the specifics.  Recommended BID dose of Briilinta. Continue DAPT, statin and BB.  -- pt had mild oozing from radial cath site and discharge with held until 04/10/19. Stable the morning of dc  2.Uncontrolled Type 2 DM - Seen by Family medicine. Medication adjusted. Seems poor insight. A1c 15 this admission. Recomended focusing on his diabetes care and lifestyle modification will be critical components to his long-term outlook.  3. Acute on chronic cidney disease III - Scr trending down since admit 2.01>>1.96>>1.94. Seems baseline around 1.7-1.9 however last year it was > 4 one  time. Likely related to uncontrolled DM. Consider BMET during follow up.   4. HLD  - No results found for requested labs within last 8760 hours.  Continue Crestor 64m daily - Check lipids as outpatient  5. HTN - BP improving on current medications. Adjust as needed.  The patient been seen by Dr. CBurt Knacktoday and deemed ready for discharge home. All follow-up appointments have been scheduled. Discharge medications are listed below.   Discharge Vitals Blood pressure (!) 163/84, pulse 93, temperature 98.3 F (36.8 C), temperature source Oral, resp. rate 18, height '5\' 9"'  (1.753 m), weight (!) 137.9 kg, SpO2 98 %.  Filed Weights   04/07/19 0542 04/08/19 0628 04/09/19 0413  Weight: (!) 140.2 kg (!) 136.9 kg (!) 137.9 kg    Labs & Radiologic Studies    CBC Recent Labs    04/09/19 0437 04/10/19 0344  WBC 5.2 5.4  HGB 11.8* 11.4*  HCT 36.9* 35.4*  MCV 85.8 86.1  PLT 177 1151  Basic Metabolic Panel Recent Labs    04/09/19 0437 04/10/19 0344  NA 140 139  K 3.5 3.6  CL 107 108  CO2 25 22  GLUCOSE 151* 153*  BUN 24* 19  CREATININE 1.94* 2.01*  CALCIUM 9.0 8.7*   Liver Function Tests No results for input(s): AST, ALT, ALKPHOS, BILITOT, PROT, ALBUMIN in the last 72 hours. No results for input(s): LIPASE, AMYLASE in the last 72 hours. Cardiac Enzymes No results for input(s): CKTOTAL, CKMB, CKMBINDEX, TROPONINI in the last 72 hours. BNP Invalid input(s): POCBNP D-Dimer No results for input(s): DDIMER in the last 72 hours. Hemoglobin A1C Recent Labs    04/07/19 1541  HGBA1C 15.0*   Fasting Lipid Panel No results for input(s): CHOL, HDL, LDLCALC, TRIG, CHOLHDL, LDLDIRECT in the last 72 hours. Thyroid Function Tests No results for input(s): TSH, T4TOTAL, T3FREE, THYROIDAB in the last 72 hours.  Invalid input(s): FREET3 _____________  Dg Chest 2 View  Result Date: 04/06/2019 CLINICAL DATA:  63 year old male with history of shortness of breath for week. Bilateral  leg swelling. EXAM: CHEST - 2 VIEW COMPARISON:  Chest x-ray 03/14/2018. FINDINGS: Scattered linear opacities throughout the mid lungs bilaterally, similar to prior examinations, most compatible with mild scarring. Lung volumes are normal. No consolidative airspace disease. No pleural effusions. No pneumothorax. No pulmonary nodule or mass noted. Pulmonary vasculature and the cardiomediastinal silhouette are within normal limits. IMPRESSION: No radiographic evidence of acute cardiopulmonary disease. Electronically Signed   By: Vinnie Langton M.D.   On: 04/06/2019 18:52   Disposition   Pt is being discharged home today in good condition.  Follow-up Plans & Appointments    Follow-up Information    [name], MD Follow up on 04/12/2019.   Specialty:  Cardiology Why:  '@9am'  Virtual office visit for hospital and 3 months follow up Contact information: [address] [place] [address] [phone]        [name], MD. Schedule an appointment as soon as possible for a visit in 2 week(s).   Specialty:  Family Medicine Why:  hospital follow up and DM managment  Contact information: [place] [address] [phone]          Discharge Instructions    Amb Referral to Cardiac Rehabilitation   Complete by:  As directed    Diagnosis:   PTCA NSTEMI     Diet - low sodium heart healthy   Complete by:  As directed    Discharge instructions   Complete by:  As directed    NO HEAVY LIFTING (>10lbs) X 2 WEEKS. NO SEXUAL ACTIVITY X 2 WEEKS. NO DRIVING X 1 WEEK. NO SOAKING BATHS, HOT TUBS, POOLS, ETC., X 7 DAYS.   Increase activity slowly   Complete by:  As directed       Discharge Medications   Allergies as of 04/10/2019      Reactions   Chlorthalidone Other (See Comments)   Causes dehydration, kidneys shut down      Medication List    STOP taking these medications   insulin NPH Human 100 UNIT/ML injection Commonly  known as:  NOVOLIN N   insulin NPH-regular Human (70-30) 100 UNIT/ML injection Commonly known as:  NovoLIN 70/30 ReliOn   insulin regular 100 units/mL injection Commonly known as:  NOVOLIN R     TAKE these medications   acetaminophen 650 MG CR tablet Commonly known as:  TYLENOL Take 1,300 mg by mouth daily as needed for pain.   allopurinol 300 MG tablet Commonly known as:  ZYLOPRIM Take 300 mg by mouth daily.   amLODipine 10 MG tablet Commonly known as:  NORVASC Take 1  tablet (10 mg total) by mouth daily.   aspirin EC 81 MG tablet Take 81 mg by mouth daily.   blood glucose meter kit and supplies Dispense based on patient and insurance preference. Use up to four times daily as directed. (FOR ICD-10 E10.9, E11.9). What changed:  additional instructions   furosemide 40 MG tablet Commonly known as:  LASIX Take 1 tablet (40 mg total) by mouth daily.   HM SALONPAS PAIN RELIEF EX Apply 1 spray topically daily as needed (pain).   hydrALAZINE 50 MG tablet Commonly known as:  APRESOLINE Take 1 tablet (50 mg total) by mouth 3 (three) times daily.   insulin aspart 100 UNIT/ML injection Commonly known as:  novoLOG Inject 6 Units into the skin 3 (three) times daily with meals.   insulin glargine 100 UNIT/ML injection Commonly known as:  LANTUS Inject 0.2 mLs (20 Units total) into the skin 2 (two) times daily.   isosorbide mononitrate 120 MG 24 hr tablet Commonly known as:  IMDUR Take 1 tablet (120 mg total) by mouth 2 (two) times daily.   metoCLOPramide 10 MG tablet Commonly known as:  REGLAN Take 1 tablet (10 mg total) by mouth every 8 (eight) hours as needed for nausea.   metoprolol succinate 100 MG 24 hr tablet Commonly known as:  Toprol XL Take 1 and 1/2 tablets by mouth daily. What changed:    how much to take  how to take this  when to take this  additional instructions   nitroGLYCERIN 0.4 MG SL tablet Commonly known as:  NITROSTAT Place 1 tablet (0.4 mg  total) under the tongue every 5 (five) minutes as needed for chest pain.   pantoprazole 40 MG tablet Commonly known as:  PROTONIX Take 1 tablet (40 mg total) by mouth 2 (two) times daily before a meal.   rosuvastatin 40 MG tablet Commonly known as:  CRESTOR Take 1 tablet (40 mg total) by mouth daily.   ticagrelor 90 MG Tabs tablet Commonly known as:  Brilinta Take 1 tablet (90 mg total) by mouth 2 (two) times daily.        Acute coronary syndrome (MI, NSTEMI, STEMI, etc) this admission?: Yes.     AHA/ACC Clinical Performance & Quality Measures: 1. Aspirin prescribed? - Yes 2. ADP Receptor Inhibitor (Plavix/Clopidogrel, Brilinta/Ticagrelor or Effient/Prasugrel) prescribed (includes medically managed patients)? - Yes 3. Beta Blocker prescribed? - Yes 4. High Intensity Statin (Lipitor 40-48m or Crestor 20-427m prescribed? - Yes 5. EF assessed during THIS hospitalization? - Yes 6. For EF <40%, was ACEI/ARB prescribed? - Not Applicable (EF >/= 4038%7. For EF <40%, Aldosterone Antagonist (Spironolactone or Eplerenone) prescribed? - Not Applicable (EF >/= 4018%8. Cardiac Rehab Phase II ordered (Included Medically managed Patients)? - Yes     Outstanding Labs/Studies   Consider BMET and Lipid panel as outpatient  Duration of Discharge Encounter   Greater than 30 minutes including physician time.  Signed, LiReino BellisNP 04/10/2019, 10:07 AM  Patient seen, examined. Available data reviewed. Agree with findings, assessment, and plan as outlined by ViRobbie LisPA.  The patient is independently interviewed and examined.  On my exam this morning, he is alert, oriented, in no distress.  Lungs are clear, heart is regular rate and rhythm with no murmur or gallop, abdomen is soft and nontender, the right radial site is clear.  Lower extremities show no edema.  Evidently the patient was kept overnight because of oozing from his right radial cath site.  This is  cleared today.  There is no  ecchymosis or hematoma.  I think he is stable for discharge.  We discussed post PCI restrictions.  We discussed medication adherence.  Appreciate the hospitalist care of this patient with respect to his diabetes.  They have outlined a home regimen for him.  Cardiology follow-up will be arranged.  He is medically stable for discharge.  Sherren Mocha, M.D. 04/10/2019 10:07 AM

## 2019-04-09 NOTE — Progress Notes (Signed)
Followed up with the patient by telephone after his cardiac catheterization/PCI procedure.  I discussed his case with Dr. Irish Lack.  The patient is doing well with no recurrent chest discomfort.  He has been through PCI procedures in the past and has been maintained on dual antiplatelet therapy with aspirin and ticagrelor.  We reviewed postprocedural instructions.  I think he is stable for hospital discharge this evening once his post catheterization monitoring period is completed.  Called his wife and discussed the findings and plan with her over the telephone as well.  Sherren Mocha 04/09/2019 3:26 PM

## 2019-04-09 NOTE — Progress Notes (Signed)
Pt stated he can place self on cpap when ready for bed. RT will continue to monitor as needed.

## 2019-04-09 NOTE — Progress Notes (Signed)
PROGRESS NOTE    Joshua [name]  DTO:[id number] DOB: 10-Oct-1956 DOA: 04/06/2019 PCP: [name], MD      Brief Narrative:  [name] is a 63 y.o. M with MO, OSA on CPAP, CKD baseline Cr 2.0, CAD with angina, IDDM, sarcoidosis, and sdCHF who presented with accelerating angina.    Admitted by Cardiology, started on heparin.  Hospitalist service were consulted for medical management of IDDM, chronic renal disease.     Assessment & Plan:  Diabetes Glucose is excellent yesterday.  -Reduce morning dose Lantus given he is n.p.o., resume previous after cath -Hold mealtime insulin while n.p.o. -I have adjusted Lantus and NovoLog doses slightly, given his aversion to hypoglycemia, I think it is reasonable to allow him to ride slightly higher   -DM coordinator consulted, appreciate cares, will anticipate transition to insurance covered basal bolus regimen at d/c rather than 70/30    CKD stage III Cr stable  NSTE-ACS -Per Cardiology       MDM and disposition: The below labs and imaging reports were reviewed and summarized above.  Medication management as above.  The patient was admitted with NSTEMI, severe hyperglycemia from poor adherence to insulin regimen.     Leukosis are better, evidently he is going for cath today.  Will be discharged on basal bolus insulin regimen.      Subjective: No chest pain, dyspnea.  No urinary symptoms, polyuria, polydipsia, malaise, hypoglycemia, jitters, sweats.  Objective: Vitals:   04/08/19 0628 04/08/19 1405 04/08/19 2004 04/09/19 0413  BP: (!) 156/96 130/75 (!) 144/80 132/84  Pulse: 77 64 63 71  Resp:   17 15  Temp: 98.4 F (36.9 C) 97.6 F (36.4 C) 98.1 F (36.7 C) 98.6 F (37 C)  TempSrc: Oral Oral Oral Oral  SpO2: 99% 99% 99% 99%  Weight: (!) 136.9 kg   (!) 137.9 kg  Height:        Intake/Output Summary (Last 24 hours) at 04/09/2019 0825 Last data filed at 04/09/2019 9833 Gross per 24 hour  Intake 720 ml   Output 1985 ml  Net -1265 ml   Filed Weights   04/07/19 0542 04/08/19 0628 04/09/19 0413  Weight: (!) 140.2 kg (!) 136.9 kg (!) 137.9 kg    Examination: General appearance: Obese adult male, lying in bed, watching television, no acute distress HEENT: Anicteric, conjunctiva pink, lids and lashes normal. No nasal deformity, discharge, epistaxis.  Lips moist, dentition normal, OP moist, no oral leasions, hearing normal.   Skin: Warm and dry.  No suspicious rashes or lesions. Cardiac: RRR, no murmurs, no lower extremity edema. Respiratory: Normal respiratory rate and rhythm, lungs clear without rales or wheezes Abdomen: Abdomen soft without tenderness to palpation MSK: No deformities or effusions.  Right metatarsal amp Neuro: Awake and alert.  EOMI, moves all extremities. Speech fluent.    Psych: Sensorium intact and responding to questions, attention normal. Affect normal.  Judgment and insight appear normal.    Data Reviewed: I have personally reviewed following labs and imaging studies:  CBC: Recent Labs  Lab 04/06/19 1857 04/07/19 1541 04/08/19 0302 04/09/19 0437  WBC 7.3 5.6 5.9 5.2  NEUTROABS 4.2  --   --   --   HGB 12.2* 12.5* 11.3* 11.8*  HCT 38.3* 38.1* 35.0* 36.9*  MCV 86.8 85.8 85.0 85.8  PLT 186 168 165 825   Basic Metabolic Panel: Recent Labs  Lab 04/06/19 1857 04/07/19 1132 04/09/19 0437  NA 134* 134* 140  K 3.9 3.9 3.5  CL 102 99 107  CO2 $Re'24 23 25  'pPD$ GLUCOSE 191* 445* 151*  BUN 36* 32* 24*  CREATININE 2.01* 1.96* 1.94*  CALCIUM 8.7* 9.3 9.0   GFR: Estimated Creatinine Clearance: 55.9 mL/min (A) (by C-G formula based on SCr of 1.94 mg/dL (H)). Liver Function Tests: Recent Labs  Lab 04/06/19 1857  AST 21  ALT 16  ALKPHOS 107  BILITOT 0.5  PROT 7.4  ALBUMIN 3.7   No results for input(s): LIPASE, AMYLASE in the last 168 hours. No results for input(s): AMMONIA in the last 168 hours. Coagulation Profile: Recent Labs  Lab 04/06/19 2115  INR  0.9   Cardiac Enzymes: Recent Labs  Lab 04/06/19 1857  TROPONINI 0.47*   BNP (last 3 results) No results for input(s): PROBNP in the last 8760 hours. HbA1C: Recent Labs    04/07/19 1541  HGBA1C 15.0*   CBG: Recent Labs  Lab 04/08/19 1129 04/08/19 1622 04/08/19 2123 04/09/19 0410 04/09/19 0807  GLUCAP 177* 166* 95 134* 175*   Lipid Profile: No results for input(s): CHOL, HDL, LDLCALC, TRIG, CHOLHDL, LDLDIRECT in the last 72 hours. Thyroid Function Tests: Recent Labs    04/07/19 0614  TSH 1.823  FREET4 1.19   Anemia Panel: No results for input(s): VITAMINB12, FOLATE, FERRITIN, TIBC, IRON, RETICCTPCT in the last 72 hours. Urine analysis:    Component Value Date/Time   COLORURINE YELLOW 03/14/2018 2145   APPEARANCEUR HAZY (A) 03/14/2018 2145   LABSPEC 1.018 03/14/2018 2145   PHURINE 5.0 03/14/2018 2145   GLUCOSEU >=500 (A) 03/14/2018 2145   HGBUR SMALL (A) 03/14/2018 2145   BILIRUBINUR NEGATIVE 03/14/2018 2145   KETONESUR 5 (A) 03/14/2018 2145   PROTEINUR 100 (A) 03/14/2018 2145   UROBILINOGEN 0.2 11/01/2011 1410   NITRITE NEGATIVE 03/14/2018 2145   LEUKOCYTESUR NEGATIVE 03/14/2018 2145   Sepsis Labs: $RemoveBefo'@[email or web address]'[id number]$ (procalcitonin:4,lacticacidven:4)  )No results found for this or any previous visit (from the past 240 hour(s)).       Radiology Studies: No results found.      Scheduled Meds: . allopurinol  300 mg Oral Daily  . amLODipine  10 mg Oral Daily  . aspirin EC  81 mg Oral Daily  . hydrALAZINE  50 mg Oral TID  . insulin aspart  0-15 Units Subcutaneous TID WC  . insulin aspart  0-5 Units Subcutaneous QHS  . insulin aspart  6 Units Subcutaneous TID WC  . insulin glargine  10 Units Subcutaneous Once  . insulin glargine  20 Units Subcutaneous BID  . isosorbide mononitrate  120 mg Oral BID  . living well with diabetes book   Does not apply Once  . metoprolol succinate  150 mg Oral Daily  . pantoprazole  40 mg Oral BID AC  . rosuvastatin  40  mg Oral Daily  . sodium chloride flush  3 mL Intravenous Q12H  . ticagrelor  90 mg Oral BID   Continuous Infusions: . sodium chloride    . sodium chloride    . heparin 1,300 Units/hr (04/08/19 1335)     LOS: 3 days    Time spent: 15 minutes     Edwin Dada, MD [facility] 04/09/2019, 8:25 AM     Please page through [place]:  [email or web address] Password [email or web address] If 7PM-7AM, please contact night-coverage

## 2019-04-09 NOTE — Progress Notes (Signed)
ANTICOAGULATION CONSULT NOTE - Follow Up Consult  Pharmacy Consult for Heparin Indication: chest pain/ACS  Allergies  Allergen Reactions  . Chlorthalidone Other (See Comments)    Causes dehydration, kidneys shut down    Patient Measurements: Height: 5\' 9"  (175.3 cm) Weight: (!) 304 lb 1.6 oz (137.9 kg) IBW/kg (Calculated) : 70.7 Heparin Dosing Weight: 103 kg  Vital Signs: Temp: 98.6 F (37 C) (04/27 0413) Temp Source: Oral (04/27 0413) BP: 132/84 (04/27 0413) Pulse Rate: 71 (04/27 0413)  Labs: Recent Labs    04/06/19 1857 04/06/19 2115  04/07/19 1132 04/07/19 1357 04/07/19 1541 04/08/19 0302 04/09/19 0437  HGB 12.2*  --   --   --   --  12.5* 11.3* 11.8*  HCT 38.3*  --   --   --   --  38.1* 35.0* 36.9*  PLT 186  --   --   --   --  168 165 177  LABPROT  --  11.9  --   --   --   --   --   --   INR  --  0.9  --   --   --   --   --   --   HEPARINUNFRC  --   --    < >  --  0.35  --  0.44 0.56  CREATININE 2.01*  --   --  1.96*  --   --   --  1.94*  TROPONINI 0.47*  --   --   --   --   --   --   --    < > = values in this interval not displayed.    Estimated Creatinine Clearance: 55.9 mL/min (A) (by C-G formula based on SCr of 1.94 mg/dL (H)).   Assessment: Anticoag: ACS. Heparin level 0.56 in goal. Hgb 11.8 stable. Plts 177 stable.  Goal of Therapy:  Heparin level 0.3-0.7 units/ml Monitor platelets by anticoagulation protocol: Yes   Plan:  Heparin 1300 units/hr Daily HL and CBC Cath on Monday   Joncarlos Atkison S. Alford Highland, PharmD, BCPS Clinical Staff Pharmacist Eilene Ghazi Stillinger 04/09/2019,9:24 AM

## 2019-04-10 ENCOUNTER — Other Ambulatory Visit: Payer: Self-pay

## 2019-04-10 ENCOUNTER — Encounter (HOSPITAL_COMMUNITY): Payer: Self-pay | Admitting: Interventional Cardiology

## 2019-04-10 LAB — CBC
HCT: 35.4 % — ABNORMAL LOW (ref 39.0–52.0)
Hemoglobin: 11.4 g/dL — ABNORMAL LOW (ref 13.0–17.0)
MCH: 27.7 pg (ref 26.0–34.0)
MCHC: 32.2 g/dL (ref 30.0–36.0)
MCV: 86.1 fL (ref 80.0–100.0)
Platelets: 158 10*3/uL (ref 150–400)
RBC: 4.11 MIL/uL — ABNORMAL LOW (ref 4.22–5.81)
RDW: 15 % (ref 11.5–15.5)
WBC: 5.4 10*3/uL (ref 4.0–10.5)
nRBC: 0 % (ref 0.0–0.2)

## 2019-04-10 LAB — BASIC METABOLIC PANEL
Anion gap: 9 (ref 5–15)
BUN: 19 mg/dL (ref 6–20)
CO2: 22 mmol/L (ref 22–32)
Calcium: 8.7 mg/dL — ABNORMAL LOW (ref 8.9–10.3)
Chloride: 108 mmol/L (ref 98–111)
Creatinine, Ser: 2.01 mg/dL — ABNORMAL HIGH (ref 0.61–1.24)
GFR calc Af Amer: 41 mL/min — ABNORMAL LOW (ref >60)
GFR calc non Af Amer: 35 mL/min — ABNORMAL LOW (ref >60)
Glucose, Bld: 153 mg/dL — ABNORMAL HIGH (ref 70–99)
Potassium: 3.6 mmol/L (ref 3.5–5.1)
Sodium: 139 mmol/L (ref 135–145)

## 2019-04-10 LAB — GLUCOSE, CAPILLARY
Glucose-Capillary: 202 mg/dL — ABNORMAL HIGH (ref 70–99)
Glucose-Capillary: 255 mg/dL — ABNORMAL HIGH (ref 70–99)

## 2019-04-10 MED ORDER — AMLODIPINE BESYLATE 10 MG PO TABS: 10.0000 mg | ORAL_TABLET | Freq: Every day | ORAL | Status: DC

## 2019-04-10 MED ORDER — ISOSORBIDE MONONITRATE ER 120 MG PO TB24: 120.0000 mg | ORAL_TABLET | Freq: Two times a day (BID) | ORAL | Status: DC

## 2019-04-10 MED FILL — Nitroglycerin IV Soln 100 MCG/ML in D5W: INTRA_ARTERIAL | Qty: 10 | Status: AC

## 2019-04-10 NOTE — Progress Notes (Signed)
Reviewed all d/c instructions with patient and copy of instructions given, patient verbalized understanding.  IV site d/ced x 2 without problems.  Patient waiting for wife to arrive to dc.

## 2019-04-10 NOTE — Progress Notes (Signed)
PROGRESS NOTE    [name]  JOI:[id number] DOB: Apr 22, 1956 DOA: 04/06/2019 PCP: [name], MD      Brief Narrative:  [name] is a 63 y.o. M with MO, OSA on CPAP, CKD baseline Cr 2.0, CAD with angina, IDDM, sarcoidosis, and sdCHF who presented with accelerating angina.    Admitted by Cardiology, started on heparin.  Hospitalist service were consulted for medical management of IDDM, chronic renal disease.     Assessment & Plan:  Diabetes Glucose control excellent in last 24 hours -Stop previous 70/30 at D/c -Discharge on Lantus 20 BID -Discharge on Novolog 6 TID AC -Discharge insulin orders pended -Glucometer and test strips/lancets are also pended -Needs PCP follow up in 1-2 weeks   CKD stage III Cr no change today, at baseline, CKD III  NSTE-ACS -Per Cardiology       MDM and disposition: The below labs and imaging reports were reviewed and summarized above.  Medication management as above.  The patient was admitted with NSTEMI, severe hyperglycemia from poor adherence to insulin regimen.     Leukosis are better, evidently he is going for cath today.  Will be discharged on basal bolus insulin regimen.      Subjective: Feels well.  Objective: Vitals:   04/09/19 1213 04/09/19 1218 04/09/19 1223 04/09/19 2111  BP: (!) 160/93 (!) 162/97 (!) 164/88 (!) 150/91  Pulse: (!) 59 65 68 64  Resp: $Remo'18 15 17 18  'VmJht$ Temp:    98.3 F (36.8 C)  TempSrc:    Oral  SpO2: 98% 96% 97% 98%  Weight:      Height:        Intake/Output Summary (Last 24 hours) at 04/10/2019 0814 Last data filed at 04/10/2019 0800 Gross per 24 hour  Intake 683.33 ml  Output 1400 ml  Net -716.67 ml   Filed Weights   04/07/19 0542 04/08/19 0628 04/09/19 0413  Weight: (!) 140.2 kg (!) 136.9 kg (!) 137.9 kg    Examination: General appearance: Obese adult male, sitting on edge of bed, no acute distress but interactive HEENT: Anicteric, conjunctiva pink, lids and lashes normal.  No nasal deformity, discharge, epistaxis.  Lips moist, dentition normal, OP moist, no oral leasions, hearing normal.   Skin: Warm and dry.  No suspicious rashes or lesions. Cardiac: RRR, no murmurs, no lower extremity edema. Respiratory: Normal respiratory rate and rhythm, lungs clear without rales or wheezes Abdomen: Abdomen soft without tenderness to palpation MSK: No deformities or effusions.  Right metatarsal amp Neuro: Alert, moves all extremities with normal coordination, speech fluent Psych: Sensorium intact and responding to questions, intention normal, affect normal, judgment and insight normal, oriented to person, place, and time.    Data Reviewed: I have personally reviewed following labs and imaging studies:  CBC: Recent Labs  Lab 04/06/19 1857 04/07/19 1541 04/08/19 0302 04/09/19 0437 04/10/19 0344  WBC 7.3 5.6 5.9 5.2 5.4  NEUTROABS 4.2  --   --   --   --   HGB 12.2* 12.5* 11.3* 11.8* 11.4*  HCT 38.3* 38.1* 35.0* 36.9* 35.4*  MCV 86.8 85.8 85.0 85.8 86.1  PLT 186 168 165 177 470   Basic Metabolic Panel: Recent Labs  Lab 04/06/19 1857 04/07/19 1132 04/09/19 0437 04/10/19 0344  NA 134* 134* 140 139  K 3.9 3.9 3.5 3.6  CL 102 99 107 108  CO2 $Re'24 23 25 22  'WzW$ GLUCOSE 191* 445* 151* 153*  BUN 36* 32* 24* 19  CREATININE 2.01* 1.96*  1.94* 2.01*  CALCIUM 8.7* 9.3 9.0 8.7*   GFR: Estimated Creatinine Clearance: 54 mL/min (A) (by C-G formula based on SCr of 2.01 mg/dL (H)). Liver Function Tests: Recent Labs  Lab 04/06/19 1857  AST 21  ALT 16  ALKPHOS 107  BILITOT 0.5  PROT 7.4  ALBUMIN 3.7   No results for input(s): LIPASE, AMYLASE in the last 168 hours. No results for input(s): AMMONIA in the last 168 hours. Coagulation Profile: Recent Labs  Lab 04/06/19 2115  INR 0.9   Cardiac Enzymes: Recent Labs  Lab 04/06/19 1857  TROPONINI 0.47*   BNP (last 3 results) No results for input(s): PROBNP in the last 8760 hours. HbA1C: Recent Labs    04/07/19  1541  HGBA1C 15.0*   CBG: Recent Labs  Lab 04/09/19 0807 04/09/19 1308 04/09/19 1705 04/09/19 2110 04/10/19 0742  GLUCAP 175* 142* 157* 133* 202*   Lipid Profile: No results for input(s): CHOL, HDL, LDLCALC, TRIG, CHOLHDL, LDLDIRECT in the last 72 hours. Thyroid Function Tests: No results for input(s): TSH, T4TOTAL, FREET4, T3FREE, THYROIDAB in the last 72 hours. Anemia Panel: No results for input(s): VITAMINB12, FOLATE, FERRITIN, TIBC, IRON, RETICCTPCT in the last 72 hours. Urine analysis:    Component Value Date/Time   COLORURINE YELLOW 03/14/2018 2145   APPEARANCEUR HAZY (A) 03/14/2018 2145   LABSPEC 1.018 03/14/2018 2145   PHURINE 5.0 03/14/2018 2145   GLUCOSEU >=500 (A) 03/14/2018 2145   HGBUR SMALL (A) 03/14/2018 2145   BILIRUBINUR NEGATIVE 03/14/2018 2145   KETONESUR 5 (A) 03/14/2018 2145   PROTEINUR 100 (A) 03/14/2018 2145   UROBILINOGEN 0.2 11/01/2011 1410   NITRITE NEGATIVE 03/14/2018 2145   LEUKOCYTESUR NEGATIVE 03/14/2018 2145   Sepsis Labs: $RemoveBefo'@[email or web address]'bXJxlLnluuE$ (procalcitonin:4,lacticacidven:4)  )No results found for this or any previous visit (from the past 240 hour(s)).       Radiology Studies: No results found.      Scheduled Meds: . allopurinol  300 mg Oral Daily  . amLODipine  10 mg Oral Daily  . aspirin  81 mg Oral Daily  . hydrALAZINE  50 mg Oral TID  . insulin aspart  0-15 Units Subcutaneous TID WC  . insulin aspart  0-5 Units Subcutaneous QHS  . insulin aspart  6 Units Subcutaneous TID WC  . insulin glargine  10 Units Subcutaneous Once  . insulin glargine  20 Units Subcutaneous BID  . isosorbide mononitrate  120 mg Oral BID  . living well with diabetes book   Does not apply Once  . metoprolol succinate  150 mg Oral Daily  . pantoprazole  40 mg Oral BID AC  . rosuvastatin  40 mg Oral Daily  . sodium chloride flush  3 mL Intravenous Q12H  . ticagrelor  90 mg Oral BID   Continuous Infusions: . sodium chloride       LOS: 4 days     Time spent: 15 minutes    Edwin Dada, MD [facility] 04/10/2019, 8:14 AM     Please page through [place]:  [email or web address] Password [email or web address] If 7PM-7AM, please contact night-coverage

## 2019-04-10 NOTE — Progress Notes (Signed)
Inpatient Diabetes Program Recommendations  AACE/ADA: New Consensus Statement on Inpatient Glycemic Control (2015)  Target Ranges:  Prepandial:   less than 140 mg/dL      Peak postprandial:   less than 180 mg/dL (1-2 hours)      Critically ill patients:  140 - 180 mg/dL   Lab Results  Component Value Date   GLUCAP 255 (H) 04/10/2019   HGBA1C 15.0 (H) 04/07/2019    Review of Glycemic Control Results for YOSSEF, GILKISON (MRN [id number]) as of 04/10/2019 12:46  Ref. Range 04/09/2019 08:07 04/09/2019 13:08 04/09/2019 17:05 04/09/2019 21:10 04/10/2019 07:42 04/10/2019 12:09  Glucose-Capillary Latest Ref Range: 70 - 99 mg/dL 175 (H) 142 (H) 157 (H) 133 (H) 202 (H) 255 (H)   Diabetes history: DM 2  Inpatient Diabetes Program Recommendations:    Note patient to d/c home today. Called and spoke with him by phone prior to d/c.  Discussed that he would be going home on Lantus/Novolog (instead of 70/30).  Patient is pleased and states that he does not have any questions.  Will follow.  Thanks,  Adah Perl, RN, BC-ADM Inpatient Diabetes Coordinator Pager [phone] (8a-5p)

## 2019-04-10 NOTE — Progress Notes (Signed)
Cardiac Rehab Advisory Cardiac Rehab Phase I is not seeing pts face to face at this time due to Covid 19 restrictions. Ambulation is occurring through nursing, PT, and mobility teams. We will help facilitate that process as needed. We are calling pts in their rooms and discussing education. We will then deliver education materials to pts RN for delivery to pt.   [phone] Checked with pt to see if any questions re ed done yesterday. Encouraged brilinta twice a day and to try to get diabetes better under control. Pt stated he had materials from diabetic coordinator. Pt had no questions. Graylon Good RN BSN 04/10/2019 8:06 AM

## 2019-04-11 ENCOUNTER — Telehealth: Payer: Self-pay | Admitting: Cardiovascular Disease

## 2019-04-11 NOTE — Telephone Encounter (Signed)
Smartphone/ my chart/ consent/ pre reg completed °

## 2019-04-12 ENCOUNTER — Telehealth (INDEPENDENT_AMBULATORY_CARE_PROVIDER_SITE_OTHER): Payer: Medicaid Other | Admitting: Cardiovascular Disease

## 2019-04-12 VITALS — Ht 69.0 in | Wt 304.0 lb

## 2019-04-12 DIAGNOSIS — I25110 Atherosclerotic heart disease of native coronary artery with unstable angina pectoris: Secondary | ICD-10-CM | POA: Diagnosis not present

## 2019-04-12 DIAGNOSIS — N183 Chronic kidney disease, stage 3 unspecified: Secondary | ICD-10-CM

## 2019-04-12 DIAGNOSIS — G4733 Obstructive sleep apnea (adult) (pediatric): Secondary | ICD-10-CM

## 2019-04-12 DIAGNOSIS — I10 Essential (primary) hypertension: Secondary | ICD-10-CM

## 2019-04-12 DIAGNOSIS — E785 Hyperlipidemia, unspecified: Secondary | ICD-10-CM

## 2019-04-12 DIAGNOSIS — I2510 Atherosclerotic heart disease of native coronary artery without angina pectoris: Secondary | ICD-10-CM | POA: Diagnosis not present

## 2019-04-12 DIAGNOSIS — E1142 Type 2 diabetes mellitus with diabetic polyneuropathy: Secondary | ICD-10-CM

## 2019-04-12 DIAGNOSIS — Z9989 Dependence on other enabling machines and devices: Secondary | ICD-10-CM

## 2019-04-12 DIAGNOSIS — I110 Hypertensive heart disease with heart failure: Secondary | ICD-10-CM

## 2019-04-12 DIAGNOSIS — Z9861 Coronary angioplasty status: Secondary | ICD-10-CM

## 2019-04-12 NOTE — Progress Notes (Signed)
Virtual Visit via Telephone Note   This visit type was conducted due to national recommendations for restrictions regarding the COVID-19 Pandemic (e.g. social distancing) in an effort to limit this patient's exposure and mitigate transmission in our community.  Due to his co-morbid illnesses, this patient is at least at moderate risk for complications without adequate follow up.  This format is felt to be most appropriate for this patient at this time.  The patient did not have access to video technology/had technical difficulties with video requiring transitioning to audio format only (telephone).  All issues noted in this document were discussed and addressed.  No physical exam could be performed with this format.  Please refer to the patient's chart for his  consent to telehealth for [facility].   Evaluation Performed:  Follow-up visit  Date:  04/12/2019   ID:  Joshua [name], DOB Feb 28, 1956, MRN [id number]  Patient Location: Home Provider Location: Home  PCP:  [name], MD  Cardiologist:  [name], MD  Electrophysiologist:  None   Chief Complaint: Initial follow-up office visit following PCI 04/09/2019 and follow-up of his sleep apnea  History of Present Illness:    Joshua [name] is a 63 y.o. male who suffered an acute coronary syndrome on 10/31/2011 and presented with ECG findings of anterolateral ST segment elevation. Cardiac catheterization revealed a 99% mid LAD stenosis and proximal to this there was a 60-70% stenosis after the first diagonal vessel. He also had 90-95% mid RCA stenosis and 30% circumflex stenosis. He underwent initial acute intervention to his LAD and a 3.0x32 mm Promus DES stent was inserted. 3 days later he underwent successful staged PCI to his 95% RCA stenosis and a 3.0x18 mm Resolute DES stent was inserted and post dilated 3.5 mm.   Additional problems include at least a >30 year history of hypertension, a 25 year history of diabetes mellitus,  obesity, as well as hyperlipidemia.  He was admitted to the hospital on 07/29/2015 with complaints of shortness of breath similar to his initial symptomatology.  He had mild renal insufficiency, was hydrated and underwent cardiac catheterization by Dr. Julianne Handler in 07/30/2015.  His LAD stents were patent.  There was mild 40% first agonal stenosis, 60% circumflex stenosis, and his RCA stents had a 50% intimal narrowing in its mid stent without significant narrowing in the distal stent.  An echo Doppler study on 07/31/2015 showed an EF of 45-50% with moderate left ventricular hypertrophy and grade 1 diastolic dysfunction.   He was seen by Jorja Loa on 08/08/2015 for initial posthospital evaluation.  Since that time, he has had some vague symptoms of GERD.  He is not on any proton pump inhibitor.  He gets shortness of breath with minimal activity including brushing his teeth and taking a shower.  He has not been routinely exercising.  He has obstructive sleep apnea and uses CPAP therapy.  He denies residual snoring.  He works at the [employer] in the night shift.  At times he feels that some of his food may get stuck when swallowing but this is not consistent.    When I saw him in September 2016, I started him on Protonix for significant GERD symptoms which has improved.  We discussed weight loss and exercise. Laboratory showed worsening renal function with a creatinine of 1.84, significant glucose elevation at 669 with probable factitious hyponatremia at 129, due to high glucose levels.  His hemoglobin A1c was 14.2.  I referred him  to Dr. Michiel Sites for endocrinologic evaluation. I also discontinued his spironolactone and added hydralazine 25 mg twice a day for more optimal blood pressure control in light of his renal insufficiency.  We discussed the importance of weight loss.  When I saw him in the office in March 2017, he has been in a cast and boot in his right leg due to prior infection of  his right toe. He did undergo hyperbaric chamber therapy.  He tells me he was initially told of having borderline osteomyelitis. Four weeks ago his cultures were negative.  He subsequently required right metatarsal amputation for osteomyelitis in August 2017.  In August he was admitted to [facility] with unstable angina and ruled in for a non-STEM>  Repeat catheterization showed new eccentric proximal LAD stenosis, not involving the previously placed overlapping mid LAD stents, eccentric 75% mid left circumflex stenosis between OM1 and OM 2, and progression of OM1 stenosis to 95%.  He ultimately underwent stenting to his LAD and circumflex marginal vessel with Synergy stents.  He was readmitted in October 2017 with recurrent chest pain which was felt to be atypical and musculoskeletal.  A 2 day protocol nuclear stress test showed an EF of 39% without ischemia.  He was seen in office follow-up by Almyra Deforest, [place] on 10/22/2016.   When I saw him in December 2017, he denied any recurrent episodes of chest pain.  His sleep was poor and he felt tired.  Over 10 years ago he was evaluated for sleep apnea but had only used CPAP for 6 months.  I referred him for a sleep study which was done on 01/07/2017.  Revealed severe obstructive sleep apnea with an AHI of 41.6/h.  He had severe oxygen desaturation to a nadir of 77%.  He had increased periodic limb movements with an index of 25.15.  On the diagnostic portion of the study there was absence of REM sleep. During the  CPAP titration there was evidence for REM rebound, such that he slept 32.6% in REM sleep.    When I  saw him in 2018, he was on CPAP for 6 weeks and felt significantly improved. I obtained a download of his CPAP unit today from 03/07/2017 through 04/05/2017.  He is 100% compliant with usage stays and usage greater than 4 hours and is averaging 8 hours and 4 minutes of sleep per night.  He is set at a 17 cm pressure his AHI is excellent at 1.5.  He does  have a moderate leak.  He uses a fullface mask.  He denies any chest pain.  He denies palpitations.  He had noticed recently that his blood pressure is elevated.    When I saw him in June 2018, he was able to get a new job.  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  His work is from 7 PM until 7 AM.  Typically he would go home and get to sleep between 8:39 AM.  Depending upon if he had to pick his son up, he would either sleep until 5 PM or if he had to get his son up would have to wake up in the early afternoon.  He denies exertionally precipitated chest pain but has noticed some sharp fleeting atypical sensations.  There was confusion with his medications and he had run out of his metoprolol succinate which he was taking 150 mg daily and nitrates   A new download was obtained in the office today from July  15 through 07/25/2017.  He did have machine malfunction contribute to reduce compliance.  However, at a 17 cm pressure.  AHI was excellent at 0.4. An Epworth Sleepiness Scale score was recalculated  and this endorsed at 7.  Since September 2018 he was using CPAP more consistently and a download from September 25 through 10/05/2017 showed 83%.  Usage stays.  However, usage greater than 4 hours was only 67%.  He was averaging 5 hours and 36 minutes.  At 17 cm set pressure.  AHI was excellent at 0.8.  He did have a moderate mask leak.  He had not use CPAP for the last 3 weeks due to significant sinus congestion.  He works from 7 PM to 7 AM.  He typically goes to bed at 8 AM until 4 PM.  He has noticed mild leg swelling.  He also has had issues with diabetes.  He was seen in the emergency room on 11/21/2017 with right-sided flank pain.  When he presented with GI symptoms and vomiting.  He denies any anginal symptomatology.  He has not been successful with weight loss.    Since the last office visit, patient was admitted with DKA and severe dehydration in the setting of uncontrolled type II  DM in April 2019. On arrival his creatinine was 5.25, CBG of 1241, aniongap was over 35. Hemoglobin A1c was greater than 15.5. On further discussion, he apparently lost his job and was not taking the insulin for more than 2-1/2 weeks.  He was seen by Almyra Deforest, PAC in December 2019, and was complaining of exertional chest discomfort for 2 months.  Therefore we recommended outpatient cardiac cath however his renal function was down and creatinine came back 2.2.  Cardiac catheterization was delayed.  I stopped his diuretic at the repeated the lab work, creatinine still came back around 2.1.  I discussed the case with Dr. Claiborne Billings and we eventually decided to use Lexiscan stress test to risk stratify.  I also increase his Imdur to 120 mg twice daily for antianginal purposes.  I increase his hydralazine to 3 times a day for better control.  Myoview was obtained on 12/26/2018 which showed EF 33%, medium sized moderate intensity severe fixed septal and anteroseptal perfusion defect which could represent scar or artifact, no significant reversible ischemia noted.  Overall this was a high risk study due to decreased LVEF which has been reduced from the previous study in 2017.  Patient presents today for cardiology office visit with Isaac Laud and was have some chest tightness recently, especially with swallowing.  He says he ran out of his pantoprazole that usually control as reflux.  I am okay to give him 23-monthsupply of Protonix, however if he needs more he will need to discuss with his PCP.  He also ran out of  Imdur 2 days ago as well, who refilled this medication.  Otherwise, because he has some discomfort with swallowing, he has not been eating very well therefore has lost significant amount of weight.  He also says he has been urinating more on the same dose of diuretic, I will defer the management of his renal function to his nephrologist.  As far as his blood sugar, he says some days his blood sugar would be down  to 70s, whereas other days, his blood sugar is quite high.  Even on the previous lab work in December, his glucose ranges between 63-561.  This is being managed by her primary care provider as well.  He had done relatively well until recentlybut apparently due to increasing recurrent episodes of nocturnal chest pain he was advised  to present to the hospital and was admitted on April 06, 2019.  He ruled in for non-STEMI. Creatinine was 2.01 on admission.  He underwent cardiac catheterization on April 09, 2019 and was found to have 90% in-stent restenosis of his proximal LAD stent.  His catheterization findings are detailed below in the CV studies section of this note.  He was discharged the following day and at discharge creatinine was 2.01, similar to admission.  Since hospital discharge, he has felt well and denies any of his previous anginal symptomatology.  He denies PND orthopnea.  Apparently, the patient had not been using his CPAP due to the sensation that he felt the pressures were blowing too high.  Remotely he was found to have severe sleep apnea which markedly normalized with his set 17 cm pressure.  However, over the last month a download reveals only 1 day of use for only 3.31 hours.  The patient does not have symptoms concerning for COVID-19 infection (fever, chills, cough, or new shortness of breath).    Past Medical History:  Diagnosis Date   Anemia    Chronic combined systolic and diastolic CHF (congestive heart failure) ([place])    a. 07/2015 Echo: EF 45-50%, mod LVH, mid apical/antsept AK, Gr 1 DD.   CKD (chronic kidney disease), stage III ([place])    Coronary artery disease    a. 2012 s/p MI/PCI: s/p DES to mid/dist RCA and overlapping DES to LAD;  b. 07/2015 Cath: LAD 10% ISR, D1 40(jailed), LCX 31m OM2 30, OM3 30, RCA 551mSR, 10d ISR; c. 07/2016 NSTEMI/PCI: LAD 85ost/p/m (3.5x20 Synergy DES overlapping prior stent), D1 40, LCX 7566mM2 95 (2.75x16 Synergy DES), OM3 30, RCA 39m30m0d.   Depression    Diabetic gastroparesis (HCC)[place] DKA (diabetic ketoacidoses) (HCC)[place]/01/2018   GERD (gastroesophageal reflux disease)    Gout    Heart murmur    Hyperlipemia    Hypertensive heart disease    Ischemic cardiomyopathy    a. 07/2015 Echo: EF 45-50%, mod LVH, mid-apicalanteroseptal AK, Gr1 DD.   Morbid obesity (HCC)[place] OSA on CPAP    Osteomyelitis (HCC)[place] a. 07/2016 MTP joint of R great toe s/p transmetatarsal amputation.   Polyneuropathy in diabetes(357.2)    Pulmonary sarcoidosis (HCC)[place] "problems w/it years ago; no problems anymore" (07/03/2014)   Rectal bleeding    Type II diabetes mellitus (HCC)[place] Past Surgical History:  Procedure Laterality Date   AMPUTATION Right 07/24/2016   Procedure: TRANSMETATARSAL FOOT AMPUTATION;  Surgeon: MarcNewt Minion;  [facility]ics;  Laterality: Right;   BALLOON DILATION N/A 03/21/2018   Procedure: BALLOON DILATION;  Surgeon: BuccRonald Lobo;  [facility];  Service: Endoscopy;  Laterality: N/A;   BREAST LUMPECTOMY Right ~ 2000   "benign"   CARDIAC CATHETERIZATION N/A 07/30/2015   Procedure: Left Heart Cath and Coronary Angiography;  Surgeon: ChriBurnell Blanks;  [facility];  Service: Cardiovascular;  Laterality: N/A;   CARDIAC CATHETERIZATION N/A 07/19/2016   Procedure: Left Heart Cath and Coronary Angiography;  Surgeon: HenrBelva Crome;  [facility];  Service: Cardiovascular;  Laterality: N/A;   CARDIAC CATHETERIZATION N/A 07/29/2016   Procedure: Coronary Stent Intervention;  Surgeon: HenrBelva Crome;  Location:  [place] INVASIVE CV LAB;  Service: Cardiovascular;  Laterality: N/A;   CARDIAC CATHETERIZATION N/A 07/29/2016   Procedure: Intravascular Ultrasound/IVUS;  Surgeon: Belva Crome, MD;  Location: [place] CV LAB;  Service: Cardiovascular;  Laterality: N/A;   CATARACT EXTRACTION W/ INTRAOCULAR LENS  IMPLANT, BILATERAL  Bilateral 2014-2015   COLONOSCOPY WITH PROPOFOL N/A 08/22/2017   Procedure: COLONOSCOPY WITH PROPOFOL;  Surgeon: [name], MD;  Location: [place];  Service: Endoscopy;  Laterality: N/A;   CORONARY ANGIOPLASTY WITH STENT PLACEMENT  10/31/2011   30 % MID ATRIOVENTRICULAR GROOVE CX STENOSIS. 90 % MID RCA.PTA TO LAD/STENTING OF TANDEM 60-70%. LAD STENOSIS 99% REDUCED TO 0%.3.0 X 32MM PROMUS DES POSTDILATED TO 3.25MM.   CORONARY ANGIOPLASTY WITH STENT PLACEMENT  07/03/2014   "2"   CORONARY STENT INTERVENTION N/A 04/09/2019   Procedure: CORONARY STENT INTERVENTION;  Surgeon: Jettie Booze, MD;  Location: [place] CV LAB;  Service: Cardiovascular;  Laterality: N/A;   ESOPHAGOGASTRODUODENOSCOPY (EGD) WITH PROPOFOL N/A 03/21/2018   Procedure: ESOPHAGOGASTRODUODENOSCOPY (EGD) WITH PROPOFOL;  Surgeon: [name], MD;  Location: [place];  Service: Endoscopy;  Laterality: N/A;   I&D EXTREMITY Right 10/30/2013   Procedure: IRRIGATION AND DEBRIDEMENT RIGHT SMALL FINGER POSSIBLE SECONDARY WOUND CLOSURE;  Surgeon: [name], MD;  Location: [facility];  Service: Orthopedics;  Laterality: Right;  [name] is available after 4pm   I&D EXTREMITY Right 11/01/2013   Procedure:  IRRIGATION AND DEBRIDEMENT RIGHT  PROXIMAL PHALANGEAL LEVEL AMPUTATION;  Surgeon: [name], MD;  Location: [facility];  Service: Orthopedics;  Laterality: Right;   INCISION AND DRAINAGE Right 10/28/2013   Procedure: INCISION AND DRAINAGE Right small finger;  Surgeon: [name], MD;  Location: [facility];  Service: Orthopedics;  Laterality: Right;   LEFT HEART CATH Left 11/03/2011   Procedure: LEFT HEART CATH;  Surgeon: [name], MD;  Location: [facility] CATH LAB;  Service: Cardiovascular;  Laterality: Left;   LEFT HEART CATH AND CORONARY ANGIOGRAPHY N/A 04/09/2019   Procedure: LEFT HEART CATH AND CORONARY ANGIOGRAPHY;  Surgeon: Jettie Booze, MD;  Location: [place] CV LAB;  Service:  Cardiovascular;  Laterality: N/A;   LEFT HEART CATHETERIZATION WITH CORONARY ANGIOGRAM N/A 07/03/2014   Procedure: LEFT HEART CATHETERIZATION WITH CORONARY ANGIOGRAM;  Surgeon: [name], MD;  [facility]: [facility] C[facility] LAB;  Service: Cardiovascular;  Laterality: N/A;   PERCUTANEOUS CORONARY STENT INTERVENTION (PCI-S) Left 11/03/2011   Procedure: PERCUTANEOUS CORONARY STENT INTERVENTION (PCI-S);  Surgeon: [name], MD;  Location: [facility] CATH LAB;  Service: Cardiovascular;  Laterality: Left;   TOE AMPUTATION Right ~ 2010   "2nd toe"   TOE AMPUTATION Right 2012   "3rd toe"     Current Meds  Medication Sig   acetaminophen (TYLENOL) 650 MG CR tablet Take 1,300 mg by mouth daily as needed for pain.   allopurinol (ZYLOPRIM) 300 MG tablet Take 300 mg by mouth daily.    amLODipine (NORVASC) 10 MG tablet Take 1 tablet (10 mg total) by mouth daily.   aspirin EC 81 MG tablet Take 81 mg by mouth daily.    blood glucose meter kit and supplies Dispense based on patient and insurance preference. Use up to four times daily as directed. (FOR ICD-10 E10.9, E11.9).   Camphor-Menthol-Methyl Sal (HM SALONPAS PAIN RELIEF EX) Apply 1 spray topically daily as needed (pain).   furosemide (LASIX) 40 MG tablet Take 1 tablet (40 mg total) by mouth daily.   hydrALAZINE (APRESOLINE) 50 MG tablet Take  1 tablet (50 mg total) by mouth 3 (three) times daily.   insulin aspart (NOVOLOG) 100 UNIT/ML injection Inject 6 Units into the skin 3 (three) times daily with meals.   insulin glargine (LANTUS) 100 UNIT/ML injection Inject 0.2 mLs (20 Units total) into the skin 2 (two) times daily.   isosorbide mononitrate (IMDUR) 120 MG 24 hr tablet Take 1 tablet (120 mg total) by mouth 2 (two) times daily.   metoprolol succinate (TOPROL XL) 100 MG 24 hr tablet Take 1 and 1/2 tablets by mouth daily. (Patient taking differently: Take 150 mg by mouth daily. )   nitroGLYCERIN (NITROSTAT) 0.4 MG SL tablet Place 1 tablet (0.4  mg total) under the tongue every 5 (five) minutes as needed for chest pain.   pantoprazole (PROTONIX) 40 MG tablet Take 1 tablet (40 mg total) by mouth 2 (two) times daily before a meal.   rosuvastatin (CRESTOR) 40 MG tablet Take 1 tablet (40 mg total) by mouth daily.   ticagrelor (BRILINTA) 90 MG TABS tablet Take 1 tablet (90 mg total) by mouth 2 (two) times daily.     Allergies:   Chlorthalidone   Social History   Tobacco Use   Smoking status: Never Smoker   Smokeless tobacco: Never Used  Substance Use Topics   Alcohol use: Yes    Comment: social    Drug use: No     Family Hx: The patient's family history includes Diabetes in his maternal grandmother; Heart attack in his maternal aunt; Hypertension in his maternal grandmother.  ROS:   Please see the history of present illness.    No fevers chills or night sweats Morbid obesity Previous recurrent nocturnal anginal symptomatology has resolved following his PCI No significant weight loss Denies abdominal pain Denied eating Diabetic on insulin Chronic kidney disease, stable Obstructive sleep apnea, recently very poor compliance with CPAP therapy  All other systems reviewed and are negative.   Prior CV studies:   The following studies were reviewed today:  CARDIAC CATH 04/09/2019  Previously placed Mid LAD drug eluting stent is widely patent.  Ost 1st Diag to 1st Diag lesion is 40% stenosed.  Dist LAD lesion is 10% stenosed.  Previously placed 2nd Mrg drug eluting stent is widely patent.  3rd Mrg lesion is 30% stenosed.  Mid Cx lesion is 50% stenosed.  RPDA lesion is 30% stenosed.  Prox RCA to Mid RCA lesion is 40% stenosed.  Mid RCA lesion is 25% stenosed.  LV end diastolic pressure is normal.  There is no aortic valve stenosis.  Ost LAD to Prox LAD lesion is 90% stenosed. In stent restenosis.  Scoring balloon angioplasty was performed using a BALLOON [place]OL[place] 3.75X10, followed by 4.0 [place]  balloon.  Prox LAD lesion is 90% stenosed.  Post intervention, there is a 0% residual stenosis.   Continue IV fluids post procedure.  He will need aggressive secondary prevention.    I stressed the importance of taking Brilinta 2x/day.  He admits to occasionally missing the second dose due to cost.  He states Plavix was stopped in the past, but he is not sure of the specifics.           Labs/Other Tests and Data Reviewed:    EKG:    Recent Labs: 04/06/2019: ALT 16; B Natriuretic Peptide 76.7 04/07/2019: TSH 1.823 04/10/2019: BUN 19; Creatinine, Ser 2.01; Hemoglobin 11.4; Platelets 158; Potassium 3.6; Sodium 139   Recent Lipid Panel Lab Results  Component Value Date/Time   CHOL 158  11/17/2016 09:21 AM   TRIG 116 11/17/2016 09:21 AM   HDL 74 11/17/2016 09:21 AM   CHOLHDL 2.1 11/17/2016 09:21 AM   LDLCALC 61 11/17/2016 09:21 AM    Wt Readings from Last 3 Encounters:  04/12/19 (!) 304 lb (137.9 kg)  04/09/19 (!) 304 lb 1.6 oz (137.9 kg)  01/12/19 299 lb 9.6 oz (135.9 kg)     Objective:    Vital Signs:  Ht _0  (1.753 m)    Wt (!) 304 lb (137.9 kg)    BMI 44.89 kg/m    Over 15 minutes were spent trying to have the patient do this conference in a video telemedicine note, but due to technical difficulty with his inability to have an image after responding to the appropriate text, this was transitioned to a phone note  He did not have a blood pressure monitor at home.  Stable in the 70s He has not been successful with weight loss His breathing appeared normal and nonlabored There was no audible wheezing He denied any chest pressure Catheterization site was healed There was no ecchymoses or significant edema He had normal cognition, affect and mood   ASSESSMENT & PLAN:    1. CAD with recent unstable angina: The patient is status post multiple PCI procedures in the past.  Over the past several weeks he had developed increasing nocturnal anginal symptomatology which  culminated in his recent admission.  Cardiac catheterization data was reviewed in detail with the patient.  He was found to have 90% in-stent Ellison's proximal LAD stent which was successfully intervened upon with [place] Balloon and noncompliant balloon dilatation with an excellent result.  His additional 4 stents are without high-grade restenosis but do reveal intimal hyperplasia.  We will continue with medical therapy for his concomitant CAD. 2. Obstructive sleep apnea: He was previously noted to have severe obstructive sleep apnea with severe internal oxygen nadir of 77%.  Over the past month he has only used CPAP 1 instance.  I suspect this has contributed to his nocturnal ischemia as a result of nocturnal oxygen desaturation.  Previously he was demonstrated to have an excellent response to a 17 cm water pressure.  He states he stopped using the CPAP because he felt the pressure was high and coming out around his mask.  I will change his mode to an auto mode and will initiate at 9 up to a maximum of 20.  Hopefully this will allow him to fall asleep prior to reaching maximum pressure.  He will need a download in another month to reassess compliance. 3. Chronic kidney disease, Stage III: Most recent creatinine was 2.01 before the day after his intervention.  We will need to continue to follow closely. 4. Essential hypertension: He did not have a blood pressure cuff at home.  He continues to be on a 5 drug regimen consisting of amlodipine, furosemide, Toprol, isosorbide, and hydralazine. 5. Diabetes mellitus with complication: History of poorly controlled with prior history of DKA.  Most recent sugars during his hospitalization were greater than 150 with 1 reading at 445.  He is followed by his primary for his diabetic management. 6. Hyperlipidemia with target LDL less than 70: Currently on rosuvastatin 40 mg 7. Morbid obesity: Weight loss and increased exercise was recommended.  I discussed the benefits of  Mediterranean diet.  COVID-19 Education: The signs and symptoms of COVID-19 were discussed with the patient and how to seek care for testing (follow up with PCP or arrange E-visit).  The importance of social distancing was discussed today.  Time:   Today, I have spent 35 minutes with the patient with telehealth technology discussing the above problems.     Medication Adjustments/Labs and Tests Ordered: Current medicines are reviewed at length with the patient today.  Concerns regarding medicines are outlined above.   Tests Ordered: No orders of the defined types were placed in this encounter.   Medication Changes: No orders of the defined types were placed in this encounter.   Disposition:  Follow up; a download of his CPAP therapy will be obtained in 1 month; office visit follow-up 2 months  Signed, [name], MD  04/12/2019 9:06 AM    [place]

## 2019-04-12 NOTE — Patient Instructions (Addendum)
Medication Instructions:  The current medical regimen is effective;  continue present plan and medications.  If you need a refill on your cardiac medications before your next appointment, please call your pharmacy.    Follow-Up: At [facility], you and your health needs are our priority.  As part of our continuing mission to provide you with exceptional heart care, we have created designated Provider Care Teams.  These Care Teams include your primary Cardiologist (physician) and Advanced Practice Providers (APPs -  Physician Assistants and Nurse Practitioners) who all work together to provide you with the care you need, when you need it. You will need a follow up appointment in 2 months. You may see [name], MD or one of the following Advanced Practice Providers on your designated Care Team: [place], [place] . Fabian Sharp, PA-C  Any Other Special Instructions Will Be Listed Below (If Applicable). Change pressure on machine from 17 to 9-20

## 2019-04-19 ENCOUNTER — Ambulatory Visit: Payer: Medicaid Other | Admitting: Dietician

## 2019-04-19 ENCOUNTER — Encounter: Payer: Self-pay | Admitting: Dietician

## 2019-04-19 DIAGNOSIS — E1142 Type 2 diabetes mellitus with diabetic polyneuropathy: Secondary | ICD-10-CM

## 2019-04-19 NOTE — Patient Instructions (Addendum)
Take your medication consistently. Discuss exercise with your doctor.   Continue to follow a low fat low sodium diet.  Avoid processed meat Find ways to add more vegetables each day. Mindfulness  Eat slowly  Chew your food well  Stop when you are satisfied Rethink what you drink  Choose more water  Avoid beverages with sugar (Coolade)  Drink fewer sugar free sodas (particularly with Splenda)- limit to 1-2 per day. Brown rice rather than white most often, choose whole wheat bread, foods that are less processed Avoid skipping meals Choose less peanut butter and have regularly scheduled meals Include more meatless meals- choose beans instead of meat.  Meat portion should be about the size of a deck of cards.  Aim for 3-4 Carb Choices per meal (45-60 grams) +/- 1 either way  Aim for 0-1 Carbs per snack if hungry  Include protein in moderation with your meals and snacks Consider reading food labels for Total Carbohydrate of foods Consider checking BG at alternate times per day

## 2019-04-19 NOTE — Progress Notes (Signed)
Diabetes Self-Management Education  Visit Type: First/Initial  Appt. Start Time: 1300 Appt. End Time: 1430 This visit was completed via telephone due to the COVID-19 pandemic.   I spoke with Joshua [name]  and verified that I was speaking with the correct person with two patient identifiers (full name and date of birth).   I discussed the limitations related to this kind of visit and the patient is willing to proceed.  History includes type 2 Diabetes and HTN for over 30 years, neuropathy, CKD, CHF, MI (1 week ago), GERD, HLD, OSA back on C-pap now for the last week.  Depression for the last 3 years (increased financial issues, COVID, and employment problems.  He has also had toes and his pinky finger amputated as a result of diabetes.  He stated that he hates to test and also struggles with portion sizes.  He does drinks sugar drinks at times in large amounts.  He struggles with motivation at times although would like to pursue acting and this is keeping him looking forward about his health and choices.  Medications include Novolog (sliding scale based on his BG).  He has not started this yet as they have been under stricter quarantine as someone at his son's work was covid positive and was unable to go to the store and could not ask anyone to pick this up.  He had some Regular insulin and has been taking 7-10 units prior to meals. He also was recently started on Lantus 20 units bid. His BG remain >200 most of the time.  He does not like to check his BG.  He states that his MD was considering the [facility] but this is not covered by Medicaid.  In the past BG was poorly controled due to no insurance and poor financial status.  Labs incliude A1C 15% 03/2019, GFR 41.  Weight hx:  304 lbs 1 week ago.  50 lbs less one year ago but he was sick with vomiting, esophagitis  Patient lives with his wife and they are living with his son.  They share shopping and cooking.   He is currently on  disability.  Uses a small amount of salt (for the past 20 years).  They do use processed meats.  He eats decreased sweets generally although occasionally he will crave sugar and drink coolade.  Complains for feeling full and poor taste at times.  04/19/2019  Joshua [name], identified by name and date of birth, is a 63 y.o. male with a diagnosis of Diabetes: Type 2.   ASSESSMENT  Height $Remov'5\' 9"'rCjMtc$  (1.753 m), weight (!) 304 lb (137.9 kg). Body mass index is 44.89 kg/m.  Diabetes Self-Management Education - 04/19/19 1327      Visit Information   Visit Type  First/Initial      Initial Visit   Diabetes Type  Type 2    Are you currently following a meal plan?  Yes    What type of meal plan do you follow?  low sugar    Are you taking your medications as prescribed?  Yes    Date Diagnosed  1990      Health Coping   How would you rate your overall health?  Fair      Psychosocial Assessment   Patient Belief/Attitude about Diabetes  Motivated to manage diabetes    Self-care barriers  None    Self-management support  Doctor's office    Other persons present  Patient    Patient Concerns  Nutrition/Meal planning;Glycemic Control;Weight Control    Special Needs  None    Preferred Learning Style  No preference indicated    Learning Readiness  Ready    How often do you need to have someone help you when you read instructions, pamphlets, or other written materials from your doctor or pharmacy?  1 - Never    What is the last grade level you completed in school?  4 years college      Pre-Education Assessment   Patient understands the diabetes disease and treatment process.  Needs Review    Patient understands incorporating nutritional management into lifestyle.  Needs Review    Patient undertands incorporating physical activity into lifestyle.  Needs Review    Patient understands using medications safely.  Needs Review    Patient understands monitoring blood glucose, interpreting and using  results  Needs Review    Patient understands prevention, detection, and treatment of acute complications.  Needs Review    Patient understands prevention, detection, and treatment of chronic complications.  Needs Review    Patient understands how to develop strategies to address psychosocial issues.  Needs Review    Patient understands how to develop strategies to promote health/change behavior.  Needs Review      Complications   Last HgB A1C per patient/outside source  15 %   04/07/2019   How often do you check your blood sugar?  1-2 times/day    Fasting Blood glucose range (mg/dL)  >200;130-179    Number of hypoglycemic episodes per month  0    Number of hyperglycemic episodes per week  14    Can you tell when your blood sugar is high?  No    Have you had a dilated eye exam in the past 12 months?  No   2016   Have you had a dental exam in the past 12 months?  No   none for many years   Are you checking your feet?  Yes    How many days per week are you checking your feet?  7      Dietary Intake   Breakfast  skips usually   sleeps late   Snack (morning)  sandwich or lunch meat or 2- spoons PB    Lunch  2 spoons PB    Snack (afternoon)  similar to am    Dinner  Spaghetti OR casserole OR baked chicken, peas, corn, rice    Snack (evening)  similar to am    Beverage(s)  diet soda (2 L Coke zero or others), water, occasional large amounts of regular coolade, iced unsweetened tea sweetened with splenda, occasional coffeee with sugar and 2% or skim milk, 1-2 cups milk daily, rare juice      Exercise   Exercise Type  ADL's   unable to due to extreme SOB   How many days per week to you exercise?  0    How many minutes per day do you exercise?  0    Total minutes per week of exercise  0      Patient Education   Previous Diabetes Education  Yes (please comment)   1 year ago when hospitalized "a lot of times prior"   Disease state   Other (comment)   review of causes and treatment for  insulin resistance   Nutrition management   Role of diet in the treatment of diabetes and the relationship between the three main macronutrients and blood glucose level;Food label reading, portion sizes and  measuring food.;Meal options for control of blood glucose level and chronic complications.;Meal timing in regards to the patients' current diabetes medication.    Physical activity and exercise   Role of exercise on diabetes management, blood pressure control and cardiac health.;Helped patient identify appropriate exercises in relation to his/her diabetes, diabetes complications and other health issue.    Medications  Taught/reviewed insulin injection, site rotation, insulin storage and needle disposal.;Reviewed patients medication for diabetes, action, purpose, timing of dose and side effects.    Monitoring  Purpose and frequency of SMBG.;Daily foot exams;Identified appropriate SMBG and/or A1C goals.;Yearly dilated eye exam    Acute complications  Taught treatment of hypoglycemia - the 15 rule.    Chronic complications  Relationship between chronic complications and blood glucose control;Reviewed with patient heart disease, higher risk of, and prevention    Psychosocial adjustment  Role of stress on diabetes;Worked with patient to identify barriers to care and solutions      Individualized Goals (developed by patient)   Nutrition  General guidelines for healthy choices and portions discussed    Physical Activity  Exercise 5-7 days per week;30 minutes per day    Medications  take my medication as prescribed    Monitoring   test my blood glucose as discussed    Reducing Risk  examine blood glucose patterns;increase portions of healthy fats      Post-Education Assessment   Patient understands the diabetes disease and treatment process.  Demonstrates understanding / competency    Patient understands incorporating nutritional management into lifestyle.  Demonstrates understanding / competency     Patient undertands incorporating physical activity into lifestyle.  Demonstrates understanding / competency    Patient understands using medications safely.  Demonstrates understanding / competency    Patient understands monitoring blood glucose, interpreting and using results  Demonstrates understanding / competency    Patient understands prevention, detection, and treatment of acute complications.  Demonstrates understanding / competency    Patient understands prevention, detection, and treatment of chronic complications.  Demonstrates understanding / competency    Patient understands how to develop strategies to address psychosocial issues.  Demonstrates understanding / competency    Patient understands how to develop strategies to promote health/change behavior.  Demonstrates understanding / competency      Outcomes   Expected Outcomes  Demonstrated interest in learning. Expect positive outcomes    Future DMSE  PRN    Program Status  Completed       Individualized Plan for Diabetes Self-Management Training:   Learning Objective:  Patient will have a greater understanding of diabetes self-management. Patient education plan is to attend individual and/or group sessions per assessed needs and concerns.   Plan:   Patient Instructions  Take your medication consistently. Discuss exercise with your doctor.   Continue to follow a low fat low sodium diet.  Avoid processed meat Find ways to add more vegetables each day. Mindfulness  Eat slowly  Chew your food well  Stop when you are satisfied Rethink what you drink  Choose more water  Avoid beverages with sugar (Coolade)  Drink fewer sugar free sodas (particularly with Splenda)- limit to 1-2 per day. Brown rice rather than white most often, choose whole wheat bread, foods that are less processed Avoid skipping meals Choose less peanut butter and have regularly scheduled meals Include more meatless meals- choose beans instead of  meat.  Meat portion should be about the size of a deck of cards.  Aim for 3-4 Carb Choices per  meal (45-60 grams) +/- 1 either way  Aim for 0-1 Carbs per snack if hungry  Include protein in moderation with your meals and snacks Consider reading food labels for Total Carbohydrate of foods Consider checking BG at alternate times per day       Expected Outcomes:  Demonstrated interest in learning. Expect positive outcomes  Education material provided: ADA - How to Thrive: A Guide for Your Journey with Diabetes, Meal plan card, My Plate and Snack sheet (via mail)  If problems or questions, patient to contact team via:  Phone  Future DSME appointment: PRN

## 2019-05-30 ENCOUNTER — Other Ambulatory Visit: Payer: Self-pay | Admitting: Physician Assistant

## 2019-05-31 ENCOUNTER — Other Ambulatory Visit: Payer: Self-pay

## 2019-05-31 ENCOUNTER — Ambulatory Visit (INDEPENDENT_AMBULATORY_CARE_PROVIDER_SITE_OTHER): Payer: Medicaid Other

## 2019-05-31 ENCOUNTER — Other Ambulatory Visit: Payer: Self-pay | Admitting: Podiatry

## 2019-05-31 ENCOUNTER — Ambulatory Visit: Payer: Medicaid Other | Admitting: Podiatry

## 2019-05-31 VITALS — BP 152/68 | HR 74 | Temp 98.4°F

## 2019-05-31 DIAGNOSIS — L02619 Cutaneous abscess of unspecified foot: Secondary | ICD-10-CM | POA: Diagnosis not present

## 2019-05-31 DIAGNOSIS — Z89431 Acquired absence of right foot: Secondary | ICD-10-CM

## 2019-05-31 DIAGNOSIS — E10621 Type 1 diabetes mellitus with foot ulcer: Secondary | ICD-10-CM

## 2019-05-31 DIAGNOSIS — L97422 Non-pressure chronic ulcer of left heel and midfoot with fat layer exposed: Secondary | ICD-10-CM | POA: Diagnosis not present

## 2019-05-31 DIAGNOSIS — L03119 Cellulitis of unspecified part of limb: Secondary | ICD-10-CM | POA: Diagnosis not present

## 2019-05-31 DIAGNOSIS — M79672 Pain in left foot: Secondary | ICD-10-CM

## 2019-05-31 DIAGNOSIS — E1142 Type 2 diabetes mellitus with diabetic polyneuropathy: Secondary | ICD-10-CM

## 2019-05-31 MED ORDER — SULFAMETHOXAZOLE-TRIMETHOPRIM 800-160 MG PO TABS: 1.0000 | ORAL_TABLET | Freq: Two times a day (BID) | ORAL | 0 refills | Status: DC

## 2019-05-31 MED ORDER — SILVER SULFADIAZINE 1 % EX CREA: TOPICAL_CREAM | CUTANEOUS | 0 refills | Status: DC

## 2019-05-31 NOTE — Progress Notes (Signed)
Subjective:  Patient ID: Joshua [name], male    DOB: [birth year],  MRN: [id number]  Chief Complaint  Patient presents with   Wound Check    Pt states left foot plantar foot wound which may be a puncture. Pt also states large blister that has gone down in the proximal tissue on lateral foot.     63 y.o. male presents for wound care. Hx as above. Hx of R TMA due to infeciton.   Review of Systems: Negative except as noted in the HPI. Denies N/V/F/Ch.  Past Medical History:  Diagnosis Date   Anemia    Chronic combined systolic and diastolic CHF (congestive heart failure) ([place])    a. 07/2015 Echo: EF 45-50%, mod LVH, mid apical/antsept AK, Gr 1 DD.   CKD (chronic kidney disease), stage III ([place])    Coronary artery disease    a. 2012 s/p MI/PCI: s/p DES to mid/dist RCA and overlapping DES to LAD;  b. 07/2015 Cath: LAD 10% ISR, D1 40(jailed), LCX 37m OM2 30, OM3 30, RCA 567mSR, 10d ISR; c. 07/2016 NSTEMI/PCI: LAD 85ost/p/m (3.5x20 Synergy DES overlapping prior stent), D1 40, LCX 7513mM2 95 (2.75x16 Synergy DES), OM3 30, RCA 82m61md.   Depression    Diabetic gastroparesis (HCC)[place] DKA (diabetic ketoacidoses) (HCC)[place]/01/2018   GERD (gastroesophageal reflux disease)    Gout    Heart murmur    Hyperlipemia    Hypertensive heart disease    Ischemic cardiomyopathy    a. 07/2015 Echo: EF 45-50%, mod LVH, mid-apicalanteroseptal AK, Gr1 DD.   Morbid obesity (HCC)[place] OSA on CPAP    Osteomyelitis (HCC)[place] a. 07/2016 MTP joint of R great toe s/p transmetatarsal amputation.   Polyneuropathy in diabetes(357.2)    Pulmonary sarcoidosis (HCC)[place] "problems w/it years ago; no problems anymore" (07/03/2014)   Rectal bleeding    Sleep apnea    Type II diabetes mellitus (HCC)[place]  Current Outpatient Medications:    Accu-Chek FastClix Lancets MISC, USE UP TO FOUR TIMES DAILY AS DIRECTED, Disp: , Rfl:    ACCU-CHEK GUIDE test strip, USE UP TO FOUR TIMES DAILY AS DIRECTED,  Disp: , Rfl:    acetaminophen (TYLENOL) 650 MG CR tablet, Take 1,300 mg by mouth daily as needed for pain., Disp: , Rfl:    allopurinol (ZYLOPRIM) 300 MG tablet, Take 300 mg by mouth daily. , Disp: , Rfl:    amLODipine (NORVASC) 10 MG tablet, Take 1 tablet (10 mg total) by mouth daily., Disp: , Rfl:    aspirin EC 81 MG tablet, Take 81 mg by mouth daily. , Disp: , Rfl:    blood glucose meter kit and supplies, Dispense based on patient and insurance preference. Use up to four times daily as directed. (FOR ICD-10 E10.9, E11.9)., Disp: 1 each, Rfl: 0   Camphor-Menthol-Methyl Sal (HM SALONPAS PAIN RELIEF EX), Apply 1 spray topically daily as needed (pain)., Disp: , Rfl:    furosemide (LASIX) 40 MG tablet, Take 1 tablet (40 mg total) by mouth daily., Disp: 90 tablet, Rfl: 3   hydrALAZINE (APRESOLINE) 50 MG tablet, Take 1 tablet (50 mg total) by mouth 3 (three) times daily., Disp: 270 tablet, Rfl: 3   insulin aspart (NOVOLOG) 100 UNIT/ML injection, Inject 6 Units into the skin 3 (three) times daily with meals., Disp: 10 mL, Rfl: 11   insulin glargine (LANTUS) 100 UNIT/ML injection, Inject 0.2 mLs (20 Units total) into  the skin 2 (two) times daily., Disp: 10 mL, Rfl: 11   isosorbide mononitrate (IMDUR) 120 MG 24 hr tablet, Take 1 tablet (120 mg total) by mouth 2 (two) times daily., Disp: , Rfl:    metoprolol succinate (TOPROL XL) 100 MG 24 hr tablet, Take 1 and 1/2 tablets by mouth daily. (Patient taking differently: Take 150 mg by mouth daily. ), Disp: 45 tablet, Rfl: 6   nitroGLYCERIN (NITROSTAT) 0.4 MG SL tablet, Place 1 tablet (0.4 mg total) under the tongue every 5 (five) minutes as needed for chest pain., Disp: 25 tablet, Rfl: 1   pantoprazole (PROTONIX) 40 MG tablet, TAKE 1 TABLET BY MOUTH TWICE DAILY BEFORE MEAL(S), Disp: 180 tablet, Rfl: 0   rosuvastatin (CRESTOR) 40 MG tablet, Take 1 tablet (40 mg total) by mouth daily., Disp: 30 tablet, Rfl: 6   ticagrelor (BRILINTA) 90 MG TABS  tablet, Take 1 tablet (90 mg total) by mouth 2 (two) times daily., Disp: 60 tablet, Rfl: 6   silver sulfADIAZINE (SILVADENE) 1 % cream, Apply pea-sized amount to wound daily., Disp: 50 g, Rfl: 0   sulfamethoxazole-trimethoprim (BACTRIM DS) 800-160 MG tablet, Take 1 tablet by mouth 2 (two) times daily., Disp: 14 tablet, Rfl: 0  Social History   Tobacco Use  Smoking Status Never Smoker  Smokeless Tobacco Never Used    Allergies  Allergen Reactions   Chlorthalidone Other (See Comments)    Causes dehydration, kidneys shut down   Objective:  There were no vitals filed for this visit. There is no height or weight on file to calculate BMI. Constitutional Well developed. Well nourished.  Vascular Dorsalis pedis pulses palpable bilaterally. Posterior tibial pulses palpable bilaterally. Capillary refill normal to all digits.  No cyanosis or clubbing noted. Pedal hair growth normal.  Neurologic Normal speech. Oriented to person, place, and time. Protective sensation absent  Dermatologic Puncture wound plantar left 5th metatarsal with purulent fluctuant abscess noted.  Orthopedic: No pain to palpation either foot. R TMA Noted   Radiographs: Taken and reviewed ST deficit no signs of osseous erosions Assessment:   1. Cellulitis and abscess of foot   2. History of transmetatarsal amputation of right foot ([place])   3. Diabetic polyneuropathy associated with type 2 diabetes mellitus ([place])    Plan:  Patient was evaluated and treated and all questions answered.  Abscess Left Foot -XR taken as above. -I and D as below -Culture taken -Rx bactrim and silvadene -F/u in 2 weeks  Procedure: I and D abscess Anesthesia: none Instrumentation: 15 blade Technique: Following  Dressing: Dry, sterile, compression dressing. Disposition: Patient tolerated procedure well. Patient to return in 1 week for follow-up.    Return in about 2 weeks (around 06/14/2019) for Abscess left foot f/u.

## 2019-06-12 NOTE — Telephone Encounter (Signed)
Open n error °

## 2019-06-13 LAB — WOUND CULTURE
MICRO NUMBER:: 584035
SPECIMEN QUALITY:: ADEQUATE

## 2019-06-13 LAB — HOUSE ACCOUNT TRACKING

## 2019-06-21 ENCOUNTER — Encounter: Payer: Self-pay | Admitting: Podiatry

## 2019-06-21 ENCOUNTER — Ambulatory Visit: Payer: Medicaid Other | Admitting: Podiatry

## 2019-06-21 ENCOUNTER — Other Ambulatory Visit: Payer: Self-pay

## 2019-06-21 VITALS — Temp 98.1°F

## 2019-06-21 DIAGNOSIS — L02619 Cutaneous abscess of unspecified foot: Secondary | ICD-10-CM

## 2019-06-21 DIAGNOSIS — L03119 Cellulitis of unspecified part of limb: Secondary | ICD-10-CM | POA: Diagnosis not present

## 2019-06-21 NOTE — Progress Notes (Signed)
Subjective:  Patient ID: Joshua [name], male    DOB: 1956-09-06,  MRN: [id number]  Chief Complaint  Patient presents with  . Foot Ulcer    the ball of the left foot is healed up and there is not any draining and no odor jsut some hard skin     63 y.o. male presents for wound care. Hx as above. Doing well denies issues took abx to completion.   Review of Systems: Negative except as noted in the HPI. Denies N/V/F/Ch.  Past Medical History:  Diagnosis Date  . Anemia   . Chronic combined systolic and diastolic CHF (congestive heart failure) ([place])    a. 07/2015 Echo: EF 45-50%, mod LVH, mid apical/antsept AK, Gr 1 DD.  [name] CKD (chronic kidney disease), stage III ([place])   . Coronary artery disease    a. 2012 s/p MI/PCI: s/p DES to mid/dist RCA and overlapping DES to LAD;  b. 07/2015 Cath: LAD 10% ISR, D1 40(jailed), LCX 15m OM2 30, OM3 30, RCA 534mSR, 10d ISR; c. 07/2016 NSTEMI/PCI: LAD 85ost/p/m (3.5x20 Synergy DES overlapping prior stent), D1 40, LCX 7528mM2 95 (2.75x16 Synergy DES), OM3 30, RCA 14m30md.  . Depression   . Diabetic gastroparesis (HCC)[place]. DKA (diabetic ketoacidoses) (HCC)[place]/01/2018  . GERD (gastroesophageal reflux disease)   . Gout   . Heart murmur   . Hyperlipemia   . Hypertensive heart disease   . Ischemic cardiomyopathy    a. 07/2015 Echo: EF 45-50%, mod LVH, mid-apicalanteroseptal AK, Gr1 DD.  . Morbid obesity (HCC)[place]. OSA on CPAP   . Osteomyelitis (HCC)[place] a. 07/2016 MTP joint of R great toe s/p transmetatarsal amputation.  . Polyneuropathy in diabetes(357.2)   . Pulmonary sarcoidosis (HCC)[place] "problems w/it years ago; no problems anymore" (07/03/2014)  . Rectal bleeding   . Sleep apnea   . Type II diabetes mellitus (HCC)[place]  Current Outpatient Medications:  .  Accu-Chek FastClix Lancets MISC, USE UP TO FOUR TIMES DAILY AS DIRECTED, Disp: , Rfl:  .  ACCU-CHEK GUIDE test strip, USE UP TO FOUR TIMES DAILY AS DIRECTED, Disp: , Rfl:  .  acetaminophen  (TYLENOL) 650 MG CR tablet, Take 1,300 mg by mouth daily as needed for pain., Disp: , Rfl:  .  allopurinol (ZYLOPRIM) 300 MG tablet, Take 300 mg by mouth daily. , Disp: , Rfl:  .  amLODipine (NORVASC) 10 MG tablet, Take 1 tablet (10 mg total) by mouth daily., Disp: , Rfl:  .  aspirin EC 81 MG tablet, Take 81 mg by mouth daily. , Disp: , Rfl:  .  blood glucose meter kit and supplies, Dispense based on patient and insurance preference. Use up to four times daily as directed. (FOR ICD-10 E10.9, E11.9)., Disp: 1 each, Rfl: 0 .  Camphor-Menthol-Methyl Sal (HM SALONPAS PAIN RELIEF EX), Apply 1 spray topically daily as needed (pain)., Disp: , Rfl:  .  furosemide (LASIX) 40 MG tablet, Take 1 tablet (40 mg total) by mouth daily., Disp: 90 tablet, Rfl: 3 .  hydrALAZINE (APRESOLINE) 50 MG tablet, Take 1 tablet (50 mg total) by mouth 3 (three) times daily., Disp: 270 tablet, Rfl: 3 .  insulin aspart (NOVOLOG) 100 UNIT/ML injection, Inject 6 Units into the skin 3 (three) times daily with meals., Disp: 10 mL, Rfl: 11 .  insulin glargine (LANTUS) 100 UNIT/ML injection, Inject 0.2 mLs (20 Units total) into the skin 2 (two) times  daily., Disp: 10 mL, Rfl: 11 .  isosorbide mononitrate (IMDUR) 120 MG 24 hr tablet, Take 1 tablet (120 mg total) by mouth 2 (two) times daily., Disp: , Rfl:  .  metoprolol succinate (TOPROL XL) 100 MG 24 hr tablet, Take 1 and 1/2 tablets by mouth daily. (Patient taking differently: Take 150 mg by mouth daily. ), Disp: 45 tablet, Rfl: 6 .  nitroGLYCERIN (NITROSTAT) 0.4 MG SL tablet, Place 1 tablet (0.4 mg total) under the tongue every 5 (five) minutes as needed for chest pain., Disp: 25 tablet, Rfl: 1 .  pantoprazole (PROTONIX) 40 MG tablet, TAKE 1 TABLET BY MOUTH TWICE DAILY BEFORE MEAL(S), Disp: 180 tablet, Rfl: 0 .  rosuvastatin (CRESTOR) 40 MG tablet, Take 1 tablet (40 mg total) by mouth daily., Disp: 30 tablet, Rfl: 6 .  silver sulfADIAZINE (SILVADENE) 1 % cream, Apply pea-sized amount to  wound daily., Disp: 50 g, Rfl: 0 .  sulfamethoxazole-trimethoprim (BACTRIM DS) 800-160 MG tablet, Take 1 tablet by mouth 2 (two) times daily., Disp: 14 tablet, Rfl: 0 .  ticagrelor (BRILINTA) 90 MG TABS tablet, Take 1 tablet (90 mg total) by mouth 2 (two) times daily., Disp: 60 tablet, Rfl: 6  Social History   Tobacco Use  Smoking Status Never Smoker  Smokeless Tobacco Never Used    Allergies  Allergen Reactions  . Chlorthalidone Other (See Comments)    Causes dehydration, kidneys shut down   Objective:   Vitals:   06/21/19 1412  Temp: 98.1 F (36.7 C)   There is no height or weight on file to calculate BMI. Constitutional Well developed. Well nourished.  Vascular Dorsalis pedis pulses palpable bilaterally. Posterior tibial pulses palpable bilaterally. Capillary refill normal to all digits.  No cyanosis or clubbing noted. Pedal hair growth normal.  Neurologic Normal speech. Oriented to person, place, and time. Protective sensation absent  Dermatologic Wound healed left foot no warmth erythema signs of acute infection. Hyperkeratosis without open wound noted upon debridement.  Orthopedic: No pain to palpation either foot. R TMA Noted   Radiographs: None today Assessment:   1. Cellulitis and abscess of foot    Plan:  Patient was evaluated and treated and all questions answered.  Abscess Left Foot -Appears healed. -No signs of continued infection. -Culture reviewed with patient.   No follow-ups on file.

## 2019-08-16 ENCOUNTER — Other Ambulatory Visit: Payer: Self-pay

## 2019-08-16 ENCOUNTER — Encounter (HOSPITAL_COMMUNITY): Payer: Self-pay | Admitting: *Deleted

## 2019-08-16 ENCOUNTER — Inpatient Hospital Stay (HOSPITAL_COMMUNITY)
Admission: EM | Admit: 2019-08-16 | Discharge: 2019-08-23 | DRG: 246 | Disposition: A | Discharge status: Home / Self Care | Payer: Medicaid Other | Attending: Family Medicine | Admitting: Family Medicine

## 2019-08-16 ENCOUNTER — Telehealth: Payer: Self-pay | Admitting: Cardiovascular Disease

## 2019-08-16 ENCOUNTER — Emergency Department (HOSPITAL_COMMUNITY): Payer: Medicaid Other

## 2019-08-16 DIAGNOSIS — N184 Chronic kidney disease, stage 4 (severe): Secondary | ICD-10-CM

## 2019-08-16 DIAGNOSIS — N183 Chronic kidney disease, stage 3 unspecified: Secondary | ICD-10-CM | POA: Diagnosis present

## 2019-08-16 DIAGNOSIS — I2510 Atherosclerotic heart disease of native coronary artery without angina pectoris: Secondary | ICD-10-CM

## 2019-08-16 DIAGNOSIS — I25110 Atherosclerotic heart disease of native coronary artery with unstable angina pectoris: Secondary | ICD-10-CM | POA: Diagnosis present

## 2019-08-16 DIAGNOSIS — I272 Pulmonary hypertension, unspecified: Secondary | ICD-10-CM | POA: Diagnosis present

## 2019-08-16 DIAGNOSIS — E1142 Type 2 diabetes mellitus with diabetic polyneuropathy: Secondary | ICD-10-CM | POA: Diagnosis present

## 2019-08-16 DIAGNOSIS — E1159 Type 2 diabetes mellitus with other circulatory complications: Secondary | ICD-10-CM | POA: Diagnosis present

## 2019-08-16 DIAGNOSIS — I200 Unstable angina: Secondary | ICD-10-CM

## 2019-08-16 DIAGNOSIS — Z794 Long term (current) use of insulin: Secondary | ICD-10-CM

## 2019-08-16 DIAGNOSIS — Z833 Family history of diabetes mellitus: Secondary | ICD-10-CM

## 2019-08-16 DIAGNOSIS — T82855A Stenosis of coronary artery stent, initial encounter: Principal | ICD-10-CM | POA: Diagnosis present

## 2019-08-16 DIAGNOSIS — I5033 Acute on chronic diastolic (congestive) heart failure: Secondary | ICD-10-CM

## 2019-08-16 DIAGNOSIS — Z992 Dependence on renal dialysis: Secondary | ICD-10-CM

## 2019-08-16 DIAGNOSIS — I130 Hypertensive heart and chronic kidney disease with heart failure and stage 1 through stage 4 chronic kidney disease, or unspecified chronic kidney disease: Secondary | ICD-10-CM | POA: Diagnosis present

## 2019-08-16 DIAGNOSIS — Z955 Presence of coronary angioplasty implant and graft: Secondary | ICD-10-CM

## 2019-08-16 DIAGNOSIS — Z8639 Personal history of other endocrine, nutritional and metabolic disease: Secondary | ICD-10-CM

## 2019-08-16 DIAGNOSIS — Z713 Dietary counseling and surveillance: Secondary | ICD-10-CM

## 2019-08-16 DIAGNOSIS — E1122 Type 2 diabetes mellitus with diabetic chronic kidney disease: Secondary | ICD-10-CM | POA: Diagnosis present

## 2019-08-16 DIAGNOSIS — Z7982 Long term (current) use of aspirin: Secondary | ICD-10-CM

## 2019-08-16 DIAGNOSIS — Y831 Surgical operation with implant of artificial internal device as the cause of abnormal reaction of the patient, or of later complication, without mention of misadventure at the time of the procedure: Secondary | ICD-10-CM | POA: Diagnosis present

## 2019-08-16 DIAGNOSIS — Z8249 Family history of ischemic heart disease and other diseases of the circulatory system: Secondary | ICD-10-CM

## 2019-08-16 DIAGNOSIS — K219 Gastro-esophageal reflux disease without esophagitis: Secondary | ICD-10-CM | POA: Diagnosis present

## 2019-08-16 DIAGNOSIS — Z888 Allergy status to other drugs, medicaments and biological substances status: Secondary | ICD-10-CM

## 2019-08-16 DIAGNOSIS — R739 Hyperglycemia, unspecified: Secondary | ICD-10-CM

## 2019-08-16 DIAGNOSIS — I5043 Acute on chronic combined systolic (congestive) and diastolic (congestive) heart failure: Secondary | ICD-10-CM | POA: Diagnosis present

## 2019-08-16 DIAGNOSIS — M109 Gout, unspecified: Secondary | ICD-10-CM | POA: Diagnosis present

## 2019-08-16 DIAGNOSIS — Z7902 Long term (current) use of antithrombotics/antiplatelets: Secondary | ICD-10-CM

## 2019-08-16 DIAGNOSIS — E1165 Type 2 diabetes mellitus with hyperglycemia: Secondary | ICD-10-CM | POA: Diagnosis present

## 2019-08-16 DIAGNOSIS — I10 Essential (primary) hypertension: Secondary | ICD-10-CM | POA: Diagnosis not present

## 2019-08-16 DIAGNOSIS — I252 Old myocardial infarction: Secondary | ICD-10-CM

## 2019-08-16 DIAGNOSIS — Z961 Presence of intraocular lens: Secondary | ICD-10-CM | POA: Diagnosis present

## 2019-08-16 DIAGNOSIS — D860 Sarcoidosis of lung: Secondary | ICD-10-CM | POA: Diagnosis present

## 2019-08-16 DIAGNOSIS — Z89431 Acquired absence of right foot: Secondary | ICD-10-CM

## 2019-08-16 DIAGNOSIS — Z20828 Contact with and (suspected) exposure to other viral communicable diseases: Secondary | ICD-10-CM | POA: Diagnosis present

## 2019-08-16 DIAGNOSIS — Z79899 Other long term (current) drug therapy: Secondary | ICD-10-CM

## 2019-08-16 DIAGNOSIS — Z6841 Body Mass Index (BMI) 40.0 and over, adult: Secondary | ICD-10-CM

## 2019-08-16 DIAGNOSIS — E118 Type 2 diabetes mellitus with unspecified complications: Secondary | ICD-10-CM

## 2019-08-16 DIAGNOSIS — N1832 Chronic kidney disease, stage 3b: Secondary | ICD-10-CM | POA: Diagnosis present

## 2019-08-16 DIAGNOSIS — N179 Acute kidney failure, unspecified: Secondary | ICD-10-CM | POA: Diagnosis present

## 2019-08-16 DIAGNOSIS — I255 Ischemic cardiomyopathy: Secondary | ICD-10-CM | POA: Diagnosis present

## 2019-08-16 DIAGNOSIS — R079 Chest pain, unspecified: Secondary | ICD-10-CM | POA: Diagnosis present

## 2019-08-16 DIAGNOSIS — E66813 Obesity, class 3: Secondary | ICD-10-CM | POA: Diagnosis present

## 2019-08-16 DIAGNOSIS — G4730 Sleep apnea, unspecified: Secondary | ICD-10-CM | POA: Diagnosis present

## 2019-08-16 DIAGNOSIS — R072 Precordial pain: Secondary | ICD-10-CM

## 2019-08-16 DIAGNOSIS — E876 Hypokalemia: Secondary | ICD-10-CM | POA: Diagnosis not present

## 2019-08-16 DIAGNOSIS — Z9861 Coronary angioplasty status: Secondary | ICD-10-CM

## 2019-08-16 DIAGNOSIS — Z9842 Cataract extraction status, left eye: Secondary | ICD-10-CM

## 2019-08-16 DIAGNOSIS — Z9841 Cataract extraction status, right eye: Secondary | ICD-10-CM

## 2019-08-16 DIAGNOSIS — E785 Hyperlipidemia, unspecified: Secondary | ICD-10-CM | POA: Diagnosis present

## 2019-08-16 DIAGNOSIS — I152 Hypertension secondary to endocrine disorders: Secondary | ICD-10-CM | POA: Diagnosis present

## 2019-08-16 DIAGNOSIS — G4733 Obstructive sleep apnea (adult) (pediatric): Secondary | ICD-10-CM | POA: Diagnosis present

## 2019-08-16 LAB — URINALYSIS, ROUTINE W REFLEX MICROSCOPIC
Bacteria, UA: NONE SEEN
Bilirubin Urine: NEGATIVE
Glucose, UA: NEGATIVE mg/dL
Ketones, ur: NEGATIVE mg/dL
Leukocytes,Ua: NEGATIVE
Nitrite: NEGATIVE
Protein, ur: 100 mg/dL — AB
Specific Gravity, Urine: 1.005 (ref 1.005–1.030)
pH: 5 (ref 5.0–8.0)

## 2019-08-16 LAB — BASIC METABOLIC PANEL
Anion gap: 15 (ref 5–15)
BUN: 43 mg/dL — ABNORMAL HIGH (ref 6–20)
CO2: 20 mmol/L — ABNORMAL LOW (ref 22–32)
Calcium: 8.9 mg/dL (ref 8.9–10.3)
Chloride: 103 mmol/L (ref 98–111)
Creatinine, Ser: 3.03 mg/dL — ABNORMAL HIGH (ref 0.61–1.24)
GFR calc Af Amer: 25 mL/min — ABNORMAL LOW (ref >60)
GFR calc non Af Amer: 21 mL/min — ABNORMAL LOW (ref >60)
Glucose, Bld: 264 mg/dL — ABNORMAL HIGH (ref 70–99)
Potassium: 3.5 mmol/L (ref 3.5–5.1)
Sodium: 138 mmol/L (ref 135–145)

## 2019-08-16 LAB — CBC
HCT: 40.3 % (ref 39.0–52.0)
Hemoglobin: 12.5 g/dL — ABNORMAL LOW (ref 13.0–17.0)
MCH: 27.2 pg (ref 26.0–34.0)
MCHC: 31 g/dL (ref 30.0–36.0)
MCV: 87.8 fL (ref 80.0–100.0)
Platelets: 184 10*3/uL (ref 150–400)
RBC: 4.59 MIL/uL (ref 4.22–5.81)
RDW: 16.1 % — ABNORMAL HIGH (ref 11.5–15.5)
WBC: 6.8 10*3/uL (ref 4.0–10.5)
nRBC: 0 % (ref 0.0–0.2)

## 2019-08-16 LAB — RAPID URINE DRUG SCREEN, HOSP PERFORMED
Amphetamines: NOT DETECTED
Barbiturates: NOT DETECTED
Benzodiazepines: NOT DETECTED
Cocaine: NOT DETECTED
Opiates: NOT DETECTED
Tetrahydrocannabinol: NOT DETECTED

## 2019-08-16 LAB — TROPONIN I (HIGH SENSITIVITY)
Troponin I (High Sensitivity): 12 ng/L (ref <18)
Troponin I (High Sensitivity): 14 ng/L (ref <18)

## 2019-08-16 LAB — GLUCOSE, CAPILLARY: Glucose-Capillary: 162 mg/dL — ABNORMAL HIGH (ref 70–99)

## 2019-08-16 LAB — HEMOGLOBIN A1C
Hgb A1c MFr Bld: 10.2 % — ABNORMAL HIGH (ref 4.8–5.6)
Mean Plasma Glucose: 246.04 mg/dL

## 2019-08-16 LAB — BRAIN NATRIURETIC PEPTIDE: B Natriuretic Peptide: 81.1 pg/mL (ref 0.0–100.0)

## 2019-08-16 LAB — SARS CORONAVIRUS 2 BY RT PCR (HOSPITAL ORDER, PERFORMED IN ~~LOC~~ HOSPITAL LAB): SARS Coronavirus 2: NEGATIVE

## 2019-08-16 LAB — TSH: TSH: 1.675 u[IU]/mL (ref 0.350–4.500)

## 2019-08-16 MED ORDER — ACETAMINOPHEN 325 MG PO TABS: 650.0000 mg | ORAL_TABLET | Freq: Four times a day (QID) | ORAL | Status: DC | PRN

## 2019-08-16 MED ORDER — HEPARIN BOLUS VIA INFUSION
4000.0000 [IU] | Freq: Once | INTRAVENOUS | Status: AC
  Administered 2019-08-16: 4000 [IU] via INTRAVENOUS
  Filled 2019-08-16: qty 4000

## 2019-08-16 MED ORDER — AMLODIPINE BESYLATE 10 MG PO TABS
10.0000 mg | ORAL_TABLET | Freq: Every day | ORAL | Status: DC
  Administered 2019-08-17 – 2019-08-23 (×7): 10 mg via ORAL
  Filled 2019-08-16 (×5): qty 1
  Filled 2019-08-16: qty 2
  Filled 2019-08-16 (×2): qty 1

## 2019-08-16 MED ORDER — ACETAMINOPHEN 650 MG RE SUPP: 650.0000 mg | Freq: Four times a day (QID) | RECTAL | Status: DC | PRN

## 2019-08-16 MED ORDER — METOPROLOL SUCCINATE ER 50 MG PO TB24
150.0000 mg | ORAL_TABLET | Freq: Every day | ORAL | Status: DC
  Administered 2019-08-17 – 2019-08-23 (×7): 150 mg via ORAL
  Filled 2019-08-16 (×7): qty 1

## 2019-08-16 MED ORDER — INSULIN GLARGINE 100 UNIT/ML ~~LOC~~ SOLN
20.0000 [IU] | Freq: Two times a day (BID) | SUBCUTANEOUS | Status: DC
  Administered 2019-08-17: 20 [IU] via SUBCUTANEOUS
  Filled 2019-08-16 (×3): qty 0.2

## 2019-08-16 MED ORDER — NITROGLYCERIN 0.4 MG SL SUBL: 0.4000 mg | SUBLINGUAL_TABLET | SUBLINGUAL | Status: DC | PRN

## 2019-08-16 MED ORDER — HEPARIN (PORCINE) 25000 UT/250ML-% IV SOLN
1200.0000 [IU]/h | INTRAVENOUS | Status: DC
  Administered 2019-08-16: 1400 [IU]/h via INTRAVENOUS
  Filled 2019-08-16 (×2): qty 250

## 2019-08-16 MED ORDER — ROSUVASTATIN CALCIUM 20 MG PO TABS
40.0000 mg | ORAL_TABLET | Freq: Every day | ORAL | Status: DC
  Administered 2019-08-17 – 2019-08-23 (×7): 40 mg via ORAL
  Filled 2019-08-16 (×8): qty 2

## 2019-08-16 MED ORDER — PANTOPRAZOLE SODIUM 40 MG PO TBEC: 40.0000 mg | DELAYED_RELEASE_TABLET | Freq: Every day | ORAL | Status: DC

## 2019-08-16 MED ORDER — INSULIN ASPART 100 UNIT/ML ~~LOC~~ SOLN: 6.0000 [IU] | Freq: Three times a day (TID) | SUBCUTANEOUS | Status: DC

## 2019-08-16 MED ORDER — ASPIRIN 81 MG PO CHEW
243.0000 mg | CHEWABLE_TABLET | Freq: Once | ORAL | Status: AC
  Administered 2019-08-16: 243 mg via ORAL
  Filled 2019-08-16: qty 3

## 2019-08-16 MED ORDER — FUROSEMIDE 10 MG/ML IJ SOLN
80.0000 mg | Freq: Two times a day (BID) | INTRAMUSCULAR | Status: DC
  Administered 2019-08-16 – 2019-08-19 (×7): 80 mg via INTRAVENOUS
  Filled 2019-08-16 (×7): qty 8

## 2019-08-16 MED ORDER — ONDANSETRON HCL 4 MG PO TABS: 4.0000 mg | ORAL_TABLET | Freq: Four times a day (QID) | ORAL | Status: DC | PRN

## 2019-08-16 MED ORDER — AMLODIPINE BESYLATE 5 MG PO TABS: 10.0000 mg | ORAL_TABLET | Freq: Every day | ORAL | Status: DC

## 2019-08-16 MED ORDER — ASPIRIN EC 81 MG PO TBEC
81.0000 mg | DELAYED_RELEASE_TABLET | Freq: Every day | ORAL | Status: DC
  Administered 2019-08-17 – 2019-08-23 (×6): 81 mg via ORAL
  Filled 2019-08-16 (×7): qty 1

## 2019-08-16 MED ORDER — SODIUM CHLORIDE 0.9% FLUSH
3.0000 mL | Freq: Two times a day (BID) | INTRAVENOUS | Status: DC
  Administered 2019-08-17 – 2019-08-22 (×4): 3 mL via INTRAVENOUS

## 2019-08-16 MED ORDER — ALLOPURINOL 300 MG PO TABS
300.0000 mg | ORAL_TABLET | Freq: Every day | ORAL | Status: DC
  Administered 2019-08-17 – 2019-08-23 (×7): 300 mg via ORAL
  Filled 2019-08-16 (×7): qty 1

## 2019-08-16 MED ORDER — INSULIN ASPART 100 UNIT/ML ~~LOC~~ SOLN
6.0000 [IU] | Freq: Three times a day (TID) | SUBCUTANEOUS | Status: DC
  Administered 2019-08-16 – 2019-08-23 (×14): 6 [IU] via SUBCUTANEOUS

## 2019-08-16 MED ORDER — ASPIRIN EC 81 MG PO TBEC: 81.0000 mg | DELAYED_RELEASE_TABLET | Freq: Every day | ORAL | Status: DC

## 2019-08-16 MED ORDER — TICAGRELOR 90 MG PO TABS
90.0000 mg | ORAL_TABLET | Freq: Two times a day (BID) | ORAL | Status: DC
  Administered 2019-08-16 – 2019-08-23 (×14): 90 mg via ORAL
  Filled 2019-08-16 (×14): qty 1

## 2019-08-16 MED ORDER — SENNOSIDES-DOCUSATE SODIUM 8.6-50 MG PO TABS: 1.0000 | ORAL_TABLET | Freq: Every evening | ORAL | Status: DC | PRN

## 2019-08-16 MED ORDER — PANTOPRAZOLE SODIUM 40 MG PO TBEC
40.0000 mg | DELAYED_RELEASE_TABLET | Freq: Two times a day (BID) | ORAL | Status: DC
  Administered 2019-08-16 – 2019-08-23 (×14): 40 mg via ORAL
  Filled 2019-08-16 (×14): qty 1

## 2019-08-16 MED ORDER — SODIUM CHLORIDE 0.9% FLUSH
3.0000 mL | Freq: Once | INTRAVENOUS | Status: AC
  Administered 2019-08-16: 3 mL via INTRAVENOUS

## 2019-08-16 MED ORDER — ISOSORBIDE MONONITRATE ER 60 MG PO TB24
120.0000 mg | ORAL_TABLET | Freq: Two times a day (BID) | ORAL | Status: DC
  Administered 2019-08-16 – 2019-08-23 (×14): 120 mg via ORAL
  Filled 2019-08-16 (×12): qty 2
  Filled 2019-08-16: qty 4
  Filled 2019-08-16 (×2): qty 2

## 2019-08-16 MED ORDER — INSULIN ASPART 100 UNIT/ML ~~LOC~~ SOLN
0.0000 [IU] | Freq: Three times a day (TID) | SUBCUTANEOUS | Status: DC
  Administered 2019-08-17: 3 [IU] via SUBCUTANEOUS
  Administered 2019-08-17: 5 [IU] via SUBCUTANEOUS
  Administered 2019-08-17: 11 [IU] via SUBCUTANEOUS
  Administered 2019-08-18: 5 [IU] via SUBCUTANEOUS
  Administered 2019-08-18: 8 [IU] via SUBCUTANEOUS
  Administered 2019-08-19: 5 [IU] via SUBCUTANEOUS
  Administered 2019-08-19 – 2019-08-20 (×2): 3 [IU] via SUBCUTANEOUS
  Administered 2019-08-20: 5 [IU] via SUBCUTANEOUS
  Administered 2019-08-20 – 2019-08-21 (×2): 3 [IU] via SUBCUTANEOUS
  Administered 2019-08-23: 5 [IU] via SUBCUTANEOUS
  Administered 2019-08-23: 3 [IU] via SUBCUTANEOUS

## 2019-08-16 MED ORDER — ONDANSETRON HCL 4 MG/2ML IJ SOLN: 4.0000 mg | Freq: Four times a day (QID) | INTRAMUSCULAR | Status: DC | PRN

## 2019-08-16 MED ORDER — ALBUTEROL SULFATE (2.5 MG/3ML) 0.083% IN NEBU: 2.5000 mg | INHALATION_SOLUTION | RESPIRATORY_TRACT | Status: DC | PRN

## 2019-08-16 NOTE — Progress Notes (Signed)
Auto CPAP (Max 20/Min 8) set up for pt per order. Pt to notify for RT if any assistance is needed with machine. RT will continue to monitor.

## 2019-08-16 NOTE — ED Notes (Signed)
Attempted to give report to 6E09.

## 2019-08-16 NOTE — ED Triage Notes (Signed)
Pt reports having mid chest pain for several days, has become more severe x 2 days. Pt has swelling to legs and sob today. Has cardiac hx.

## 2019-08-16 NOTE — ED Provider Notes (Signed)
[facility] EMERGENCY DEPARTMENT Provider Note   CSN: [id number] Arrival date & time: 08/16/19  1140     History   Chief Complaint Chief Complaint  Patient presents with   Chest Pain    HPI Joshua [name] is a 63 y.o. male with past medical history significant for CAD with stents on Brill[place], CKD stage III, type 2 diabetes, hypertension, hyperlipidemia, pulmonary sarcoidosis presents emergency department today with chief complaint of chest pain.  This been going on for the last 10 days however has worsened over the last 2 days.  He describes the pain as being located on the left side of his chest and he describes it as sharp.  It does not radiate.  Today chest pain was worse with exertion and he became short of breath.  He took nitroglycerin last night which relieved his pain.  When the chest pain came back today he took nitro again and states minimal symptom improvement. He also admits to lower extremity edema. He saw his nephrologist [name] was was concerned about the edema and increased lasix and he is scheduled to follow up next week.    Patient's cardiologist is Dr. Claiborne Billings.  Past Medical History:  Diagnosis Date   Anemia    Chronic combined systolic and diastolic CHF (congestive heart failure) ([place])    a. 07/2015 Echo: EF 45-50%, mod LVH, mid apical/antsept AK, Gr 1 DD.   CKD (chronic kidney disease), stage III ([place])    Coronary artery disease    a. 2012 s/p MI/PCI: s/p DES to mid/dist RCA and overlapping DES to LAD;  b. 07/2015 Cath: LAD 10% ISR, D1 40(jailed), LCX 22m OM2 30, OM3 30, RCA 537mSR, 10d ISR; c. 07/2016 NSTEMI/PCI: LAD 85ost/p/m (3.5x20 Synergy DES overlapping prior stent), D1 40, LCX 7536mM2 95 (2.75x16 Synergy DES), OM3 30, RCA 68m109md.   Depression    Diabetic gastroparesis (HCC)[place] DKA (diabetic ketoacidoses) (HCC)[place]/01/2018   GERD (gastroesophageal reflux disease)    Gout    Heart murmur    Hyperlipemia    Hypertensive  heart disease    Ischemic cardiomyopathy    a. 07/2015 Echo: EF 45-50%, mod LVH, mid-apicalanteroseptal AK, Gr1 DD.   Morbid obesity (HCC)[place] OSA on CPAP    Osteomyelitis (HCC)[place] a. 07/2016 MTP joint of R great toe s/p transmetatarsal amputation.   Polyneuropathy in diabetes(357.2)    Pulmonary sarcoidosis (HCC)[place] "problems w/it years ago; no problems anymore" (07/03/2014)   Rectal bleeding    Sleep apnea    [facility]  Patient Active Problem List   Diagnosis Date Noted   NSTEMI (non-ST elevated myocardial infarction) (HCC)[place]/24/2020   ACS (acute coronary syndrome) (HCC)[place]/24/2020   Sepsis (HCC)[place]/02/2018   DKA (diabetic ketoacidoses) (HCC)[place]/02/2018   Special screening for malignant neoplasms, colon 08/22/2017   Acute renal failure superimposed on stage 3 chronic kidney disease (HCC)[place]/06/2017   Essential hypertension    Status post transmetatarsal amputation of foot, right (HCC)[place]/27/2017   Diabetic polyneuropathy associated with type 2 diabetes mellitus (HCC)[place]/27/2017   Pure hypercholesterolemia    AKI (acute kidney injury) (HCC)[place] Acute blood loss anemia    DM type 2 with diabetic peripheral neuropathy (HCC)[place] Physical debility 08/03/2016   Chronic osteomyelitis of right foot (HCC)    Cellulitis and abscess of leg, except foot  Penetrating wound of right foot    CKD (chronic kidney disease) stage 3, GFR 30-59 ml/min (HCC)    Unstable angina pectoris ([place]) 07/18/2016   Chronic combined systolic and diastolic congestive heart failure (HCC)    Chest pain with moderate risk for cardiac etiology 06/24/2014   CAD S/P multiple PCIs 06/24/2014   Sleep apnea- on C-pap 06/24/2014   Morbid obesity due to excess calories ([place]) 11/10/2013   Hyperglycemia 10/29/2013   Hyperlipidemia 01/12/2011   Benign essential HTN 01/12/2011   GERD 01/12/2011    Past Surgical History:  Procedure Laterality Date    AMPUTATION Right 07/24/2016   Procedure: TRANSMETATARSAL FOOT AMPUTATION;  Surgeon: [name], MD;  Location: [facility];  Service: Orthopedics;  Laterality: Right;   BALLOON DILATION N/A 03/21/2018   Procedure: BALLOON DILATION;  Surgeon: [name], MD;  Location: [facility] ENDOSCOPY;  Service: Endoscopy;  Laterality: N/A;   BREAST LUMPECTOMY Right ~ 2000   "benign"   CARDIAC CATHETERIZATION N/A 07/30/2015   Procedure: Left Heart Cath and Coronary Angiography;  Surgeon: Burnell Blanks, MD;  Location: [facility] CV LAB;  Service: Cardiovascular;  Laterality: N/A;   CARDIAC CATHETERIZATION N/A 07/19/2016   Procedure: Left Heart Cath and Coronary Angiography;  Surgeon: Belva Crome, MD;  Location: [facility] CV LAB;  Service: Cardiovascular;  Laterality: N/A;   CARDIAC CATHETERIZATION N/A 07/29/2016   Procedure: Coronary Stent Intervention;  Surgeon: Belva Crome, MD;  Location: [facility] CV LAB;  Service: Cardiovascular;  Laterality: N/A;   CARDIAC CATHETERIZATION N/A 07/29/2016   Procedure: Intravascular Ultrasound/IVUS;  Surgeon: Belva Crome, MD;  Location: [facility] CV LAB;  Service: Cardiovascular;  Laterality: N/A;   CATARACT EXTRACTION W/ INTRAOCULAR LENS  IMPLANT, BILATERAL Bilateral 2014-2015   COLONOSCOPY WITH PROPOFOL N/A 08/22/2017   Procedure: COLONOSCOPY WITH PROPOFOL;  Surgeon: [name], MD;  Location: [facility];  Service: Endoscopy;  Laterality: N/A;   CORONARY ANGIOPLASTY WITH STENT PLACEMENT  10/31/2011   30 % MID ATRIOVENTRICULAR GROOVE CX STENOSIS. 90 % MID RCA.PTA TO LAD/STENTING OF TANDEM 60-70%. LAD STENOSIS 99% REDUCED TO 0%.3.0 X 32MM PROMUS DES POSTDILATED TO 3.25MM.   CORONARY ANGIOPLASTY WITH STENT PLACEMENT  07/03/2014   "2"   CORONARY STENT INTERVENTION N/A 04/09/2019   Procedure: CORONARY STENT INTERVENTION;  Surgeon: Jettie Booze, MD;  Location: [facility] CV LAB;  Service: Cardiovascular;  Laterality: N/A;    ESOPHAGOGASTRODUODENOSCOPY (EGD) WITH PROPOFOL N/A 03/21/2018   Procedure: ESOPHAGOGASTRODUODENOSCOPY (EGD) WITH PROPOFOL;  Surgeon: [name], MD;  Location: [facility];  Service: Endoscopy;  Laterality: N/A;   I&D EXTREMITY Right 10/30/2013   Procedure: IRRIGATION AND DEBRIDEMENT RIGHT SMALL FINGER POSSIBLE SECONDARY WOUND CLOSURE;  Surgeon: [name], MD;  Location: [facility] ORS;  Service: Orthopedics;  Laterality: Right;  [name] is available after 4pm   I&D EXTREMITY Right 11/01/2013   Procedure:  IRRIGATION AND DEBRIDEMENT RIGHT  PROXIMAL PHALANGEAL LEVEL AMPUTATION;  Surgeon: [name], MD;  Location: [facility];  Service: Orthopedics;  Laterality: Right;   INCISION AND DRAINAGE Right 10/28/2013   Procedure: INCISION AND DRAINAGE Right small finger;  Surgeon: [name], MD;  Location: [facility];  Service: Orthopedics;  Laterality: Right;   LEFT HEART CATH Left 11/03/2011   Procedure: LEFT HEART CATH;  Surgeon: [name], MD;  Location: [facility] CATH LAB;  Service: Cardiovascular;  Laterality: Left;   LEFT HEART CATH AND CORONARY ANGIOGRAPHY N/A 04/09/2019   Procedure: LEFT HEART CATH AND CORONARY  ANGIOGRAPHY;  Surgeon: Jettie Booze, MD;  Location: [facility] CV LAB;  Service: Cardiovascular;  Laterality: N/A;   LEFT HEART CATHETERIZATION WITH CORONARY ANGIOGRAM N/A 07/03/2014   Procedure: LEFT HEART CATHETERIZATION WITH CORONARY ANGIOGRAM;  Surgeon: [name], MD;  Location: [facility] CATH LAB;  Service: Cardiovascular;  Laterality: N/A;   PERCUTANEOUS CORONARY STENT INTERVENTION (PCI-S) Left 11/03/2011   Procedure: PERCUTANEOUS CORONARY STENT INTERVENTION (PCI-S);  Surgeon: [name], MD;  Location: [facility] CATH LAB;  Service: Cardiovascular;  Laterality: Left;   TOE AMPUTATION Right ~ 2010   "2nd toe"   TOE AMPUTATION Right 2012   "3rd toe"        Home Medications    Prior to Admission medications   Medication Sig Start Date End Date Taking?  Authorizing Provider  acetaminophen (TYLENOL) 650 MG CR tablet Take 1,300 mg by mouth daily as needed for pain.   Yes [provider]  allopurinol (ZYLOPRIM) 300 MG tablet Take 300 mg by mouth daily.    Yes [provider]  amLODipine (NORVASC) 10 MG tablet Take 1 tablet (10 mg total) by mouth daily. 04/10/19  Yes Reino Bellis B, NP  aspirin EC 81 MG tablet Take 81 mg by mouth daily.    Yes [provider]  Camphor-Menthol-Methyl Sal (HM SALONPAS PAIN RELIEF EX) Apply 1 spray topically daily as needed (pain).   Yes [provider]  furosemide (LASIX) 40 MG tablet Take 1 tablet (40 mg total) by mouth daily. Patient taking differently: Take 40 mg by mouth 2 (two) times daily.  01/12/19  Yes Almyra Deforest, PA  hydrALAZINE (APRESOLINE) 50 MG tablet Take 1 tablet (50 mg total) by mouth 3 (three) times daily. 01/12/19  Yes Almyra Deforest, PA  insulin aspart (NOVOLOG) 100 UNIT/ML injection Inject 6 Units into the skin 3 (three) times daily with meals. Patient taking differently: Inject 10-12 Units into the skin 2 (two) times daily. Sliding scale 04/09/19  Yes Bhagat, Bhavinkumar, PA  insulin glargine (LANTUS) 100 UNIT/ML injection Inject 0.2 mLs (20 Units total) into the skin 2 (two) times daily. 04/09/19  Yes Bhagat, Bhavinkumar, PA  isosorbide mononitrate (IMDUR) 120 MG 24 hr tablet Take 1 tablet (120 mg total) by mouth 2 (two) times daily. 04/10/19  Yes Reino Bellis B, NP  metoprolol succinate (TOPROL XL) 100 MG 24 hr tablet Take 1 and 1/2 tablets by mouth daily. Patient taking differently: Take 150 mg by mouth daily.  01/12/19  Yes Almyra Deforest, PA  nitroGLYCERIN (NITROSTAT) 0.4 MG SL tablet Place 1 tablet (0.4 mg total) under the tongue every 5 (five) minutes as needed for chest pain. 01/12/19  Yes Almyra Deforest, PA  pantoprazole (PROTONIX) 40 MG tablet TAKE 1 TABLET BY MOUTH TWICE DAILY BEFORE MEAL(S) Patient taking differently: Take 40 mg by mouth daily.  05/30/19  Yes [name], MD  rosuvastatin (CRESTOR) 40 MG tablet Take 1 tablet (40 mg total) by mouth daily. 01/12/19  Yes Almyra Deforest, PA  ticagrelor (BRILINTA) 90 MG TABS tablet Take 1 tablet (90 mg total) by mouth 2 (two) times daily. 01/12/19  Yes Meng, Isaac Laud, PA  Accu-Chek FastClix Lancets MISC USE UP TO FOUR TIMES DAILY AS DIRECTED 04/10/19   [provider]  ACCU-CHEK GUIDE test strip USE UP TO FOUR TIMES DAILY AS DIRECTED 04/10/19   [provider]  blood glucose meter kit and supplies Dispense based on patient and insurance preference. Use up to four times daily as directed. (  FOR ICD-10 E10.9, E11.9). 04/09/19   Bhagat, Crista Luria, PA  silver sulfADIAZINE (SILVADENE) 1 % cream Apply pea-sized amount to wound daily. Patient not taking: Reported on 08/16/2019 05/31/19   Evelina Bucy, DPM  sulfamethoxazole-trimethoprim (BACTRIM DS) 800-160 MG tablet Take 1 tablet by mouth 2 (two) times daily. Patient not taking: Reported on 08/16/2019 05/31/19   Evelina Bucy, DPM    Family History Family History  Problem Relation Age of Onset   Heart attack Maternal Aunt    Diabetes Maternal Grandmother    Hypertension Maternal Grandmother     Social History Social History   Tobacco Use   Smoking status: Never Smoker   Smokeless tobacco: Never Used  Substance Use Topics   Alcohol use: Yes    Comment: social    Drug use: No     Allergies   Chlorthalidone   Review of Systems Review of Systems  Constitutional: Negative for chills and fever.  HENT: Negative for congestion, rhinorrhea, sinus pressure and sore throat.   Eyes: Negative for pain and redness.  Respiratory: Positive for shortness of breath. Negative for cough and wheezing.   Cardiovascular: Positive for chest pain and leg swelling. Negative for palpitations.  Gastrointestinal: Negative for abdominal pain, constipation, diarrhea, nausea and vomiting.  Genitourinary: Negative for dysuria.  Musculoskeletal: Negative for  arthralgias, back pain, myalgias and neck pain.  Skin: Negative for rash and wound.  Neurological: Negative for dizziness, syncope, weakness, numbness and headaches.  Psychiatric/Behavioral: Negative for confusion.     Physical Exam Updated Vital Signs BP (!) 157/78 (BP Location: Right Arm)    Pulse 88    Temp 98.4 F (36.9 C) (Oral)    Resp 18    SpO2 98%   Physical Exam Vitals signs and nursing note reviewed.  Constitutional:      General: He is not in acute distress.    Appearance: He is not ill-appearing.  HENT:     Head: Normocephalic and atraumatic.     Right Ear: Tympanic membrane and external ear normal.     Left Ear: Tympanic membrane and external ear normal.     Nose: Nose normal.     Mouth/Throat:     Mouth: Mucous membranes are moist.     Pharynx: Oropharynx is clear.  Eyes:     General: No scleral icterus.       Right eye: No discharge.        Left eye: No discharge.     Extraocular Movements: Extraocular movements intact.     Conjunctiva/sclera: Conjunctivae normal.     Pupils: Pupils are equal, round, and reactive to light.  Neck:     Musculoskeletal: Normal range of motion.     Vascular: No JVD.  Cardiovascular:     Rate and Rhythm: Normal rate and regular rhythm.     Pulses: Normal pulses.          Radial pulses are 2+ on the right side and 2+ on the left side.     Heart sounds: Normal heart sounds. No murmur.  Pulmonary:     Comments: Lungs clear to auscultation in all fields. Symmetric chest rise. No wheezing, rales, or rhonchi. Abdominal:     Comments: Abdomen is soft, non-distended, and non-tender in all quadrants. No rigidity, no guarding. No peritoneal signs.  Musculoskeletal: Normal range of motion.     Right lower leg: 1+ Pitting Edema present.     Left lower leg: 1+ Pitting Edema present.  Skin:  General: Skin is warm and dry.     Capillary Refill: Capillary refill takes less than 2 seconds.  Neurological:     Mental Status: He is  oriented to person, place, and time.     GCS: GCS eye subscore is 4. GCS verbal subscore is 5. GCS motor subscore is 6.     Comments: Fluent speech, no facial droop.  Psychiatric:        Behavior: Behavior normal.      ED Treatments / Results  Labs (all labs ordered are listed, but only abnormal results are displayed) Labs Reviewed  BASIC METABOLIC PANEL - Abnormal; Notable for the following components:      Result Value   CO2 20 (*)    Glucose, Bld 264 (*)    BUN 43 (*)    Creatinine, Ser 3.03 (*)    GFR calc non Af Amer 21 (*)    GFR calc Af Amer 25 (*)    All other components within normal limits  CBC - Abnormal; Notable for the following components:   Hemoglobin 12.5 (*)    RDW 16.1 (*)    All other components within normal limits  SARS CORONAVIRUS 2 (HOSPITAL ORDER, [facility])  TROPONIN I (HIGH SENSITIVITY)  TROPONIN I (HIGH SENSITIVITY)    EKG EKG Interpretation  Date/Time:  Thursday August 16 2019 12:56:01 EDT Ventricular Rate:  79 PR Interval:  216 QRS Duration: 104 QT Interval:  387 QTC Calculation: 444 R Axis:   65 Text Interpretation:  Sinus rhythm Prolonged PR interval Low voltage, precordial leads No significant change since last tracing Confirmed by Wandra Arthurs [phone]) on 08/16/2019 1:05:32 PM   Radiology Dg Chest 2 View  Result Date: 08/16/2019 CLINICAL DATA:  Left-sided chest pain with shortness of breath for 1 week. EXAM: CHEST - 2 VIEW COMPARISON:  April 06, 2019 FINDINGS: Cardiomediastinal silhouette is normal. Mediastinal contours appear intact. There is no evidence of focal airspace consolidation, pleural effusion or pneumothorax. Osseous structures are without acute abnormality. Stigmata of diffuse idiopathic skeletal hyperostosis of the thoracic spine. Soft tissues are grossly normal. IMPRESSION: No active cardiopulmonary disease. Electronically Signed   By: Fidela Salisbury M.D.   On: 08/16/2019 12:31     Procedures Procedures (including critical care time)  Medications Ordered in ED Medications  sodium chloride flush (NS) 0.9 % injection 3 mL (has no administration in time range)  aspirin chewable tablet 243 mg (243 mg Oral Given 08/16/19 1406)     Initial Impression / Assessment and Plan / ED Course  I have reviewed the triage vital signs and the nursing notes.  Pertinent labs & imaging results that were available during my care of the patient were reviewed by me and considered in my medical decision making (see chart for details).  63 year old male with significant cardiac history presents with exertional chest pain.  On arrival he is resting comfortably, no acute distress.  Lungs are clear to auscultation in all fields. He does have 1+ bilateral lower extremity edema. EKG is without ischemic changes. CBC shows hemoglobin of 12.5, appears similar to baseline. BMP with BUN/creatinine of 43/3.03, pt has history of CKD stage 3 but this appears up from his baseline, possibly related to recent increase in lasix. Chest xray viewed by me is negative for infiltrate. First troponin is negative. Pt took 1 baby aspirin prior to arrival, will give 3 baby aspirin. Case discussed with cardiology who will be down to evaluate the  patient. Anticipate admission. This case was discussed with Dr. Darl Householder who has seen the patient and agrees with plan to admit.  This note was prepared using Dragon voice recognition software and may include unintentional dictation errors due to the inherent limitations of voice recognition software.  Final Clinical Impressions(s) / ED Diagnoses   Final diagnoses:  Chest pain, unspecified type    ED Discharge Orders    None       Cherre Robins, PA-C 08/16/19 1851    Drenda Freeze, MD 08/18/19 1540

## 2019-08-16 NOTE — Progress Notes (Signed)
ANTICOAGULATION CONSULT NOTE - Initial Consult  Pharmacy Consult for Heparin Indication: chest pain/ACS  Allergies  Allergen Reactions  . Chlorthalidone Other (See Comments)    Causes dehydration, kidneys shut down    Patient Measurements: Height: 5\' 9"  (175.3 cm) Weight: (!) 330 lb (149.7 kg) IBW/kg (Calculated) : 70.7 Heparin Dosing Weight: 106.8 kg   Vital Signs: Temp: 98.4 F (36.9 C) (09/03 1149) Temp Source: Oral (09/03 1149) BP: 108/50 (09/03 1715) Pulse Rate: 77 (09/03 1715)  Labs: Recent Labs    08/16/19 1158 08/16/19 1411  HGB 12.5*  --   HCT 40.3  --   PLT 184  --   CREATININE 3.03*  --   TROPONINIHS 12 14    Estimated Creatinine Clearance: 37.5 mL/min (A) (by C-G formula based on SCr of 3.03 mg/dL (H)).   Medical History: Past Medical History:  Diagnosis Date  . Anemia   . Chronic combined systolic and diastolic CHF (congestive heart failure) ([place])    a. 07/2015 Echo: EF 45-50%, mod LVH, mid apical/antsept AK, Gr 1 DD.  [name] CKD (chronic kidney disease), stage III ([place])   . Coronary artery disease    a. 2012 s/p MI/PCI: s/p DES to mid/dist RCA and overlapping DES to LAD;  b. 07/2015 Cath: LAD 10% ISR, D1 40(jailed), LCX 65m, OM2 30, OM3 30, RCA 96m ISR, 10d ISR; c. 07/2016 NSTEMI/PCI: LAD 85ost/p/m (3.5x20 Synergy DES overlapping prior stent), D1 40, LCX 62m, OM2 95 (2.75x16 Synergy DES), OM3 30, RCA 97m, 10d.  . Depression   . Diabetic gastroparesis ([place])   . DKA (diabetic ketoacidoses) ([place]) 03/14/2018  . GERD (gastroesophageal reflux disease)   . Gout   . Heart murmur   . Hyperlipemia   . Hypertensive heart disease   . Ischemic cardiomyopathy    a. 07/2015 Echo: EF 45-50%, mod LVH, mid-apicalanteroseptal AK, Gr1 DD.  . Morbid obesity ([place])   . OSA on CPAP   . Osteomyelitis ([place])    a. 07/2016 MTP joint of R great toe s/p transmetatarsal amputation.  . Polyneuropathy in diabetes(357.2)   . Pulmonary sarcoidosis ([place])    "problems w/it years ago;  no problems anymore" (07/03/2014)  . Rectal bleeding   . Sleep apnea   . Type II diabetes mellitus (HCC)     Medications:  Scheduled:  . allopurinol  300 mg Oral Daily  . amLODipine  10 mg Oral Daily  . aspirin EC  81 mg Oral Daily  . furosemide  80 mg Intravenous Q12H  . heparin  4,000 Units Intravenous Once  . [START ON 08/17/2019] insulin aspart  6 Units Subcutaneous TID WC  . insulin glargine  20 Units Subcutaneous BID  . isosorbide mononitrate  120 mg Oral BID  . metoprolol succinate  150 mg Oral Daily  . pantoprazole  40 mg Oral Daily  . rosuvastatin  40 mg Oral Daily  . ticagrelor  90 mg Oral BID   Infusions:  . heparin      Assessment: 63 y.o. male presenting with intermittent chest pain for the last 7-10 days and increased lower leg edema. PMH includes long-standing CAD with multiple PCIs, HTN, DM2, obesity, HDL, and CKD. No anticoagulation PTA. No reports of bleeding noted. Pharmacy consulted to dose heparin.  Hgb 12.5, Plts 184. Weight per patient 330 lbs.  Goal of Therapy:  Heparin level 0.3-0.7 units/ml Monitor platelets by anticoagulation protocol: Yes   Plan:  Heparin bolus 4000 units Heparin gtt 1400 units/hr  Check 6hr initial  HL Daily INR and CBC Monitor s/sx of bleeding   Lorel Monaco, PharmD PGY1 Ambulatory Care Resident [employer] # [phone]

## 2019-08-16 NOTE — H&P (Signed)
History and Physical  Joshua [name] HTD:[id number] DOB: Apr 27, 1956 DOA: 08/16/2019  Patient coming from: Home & is able to ambulate  Chief Complaint: Chest pain  HPI: Joshua [name] is a 63 y.o. male with medical history significant for extensive cardiac history, CAD with multiple PCI's, diabetes mellitus type 2, hypertension, hyperlipidemia, CKD, sleep apnea, morbid obesity, presents to the ED complaining of chest pain.  Patient reports intermittent left-sided chest pain for the past 10 days worsening over the past 2 days, describes it as pressure, nonradiating, 7/10 at its worse, minimally relieved with nitroglycerin with associated chronic shortness of breath, lower extremity edema and weight gain of about 25 pounds in the last couple of months.  Pt denies any nausea/vomiting, diaphoresis during any episodes of chest pain.  Of note, patient is well-known to cardiology, has had STEMI in 2012, with multiple stents.  Last catheterization was done in April for NSTEMI, which showed stent restenosis status post balloon angioplasty.  Patient was recently seen by his nephrologist who recommended increasing his dose of Lasix to 80 mg daily, despite doing this, patient still with persistent lower extremity edema.  Of note, questionable compliance to medication and diet.  Patient reports drinking about 3-4 beers every night.  Patient denies any tobacco/illicit drug use.   ED Course: Patient vital signs somewhat stable, blood glucose noted to be elevated around 264, creatinine 3.03 elevated from baseline, troponin unremarkable, chest x-ray unremarkable, EKG with normal sinus rhythm, no acute ST changes.  Cardiology was consulted, started patient on IV Lasix for diuresis, and heparin drip.  Hospitalist asked to admit patient due to uncontrolled blood glucose.  Review of Systems: Review of systems are otherwise negative   Past Medical History:  Diagnosis Date  . Anemia   . Chronic combined systolic and  diastolic CHF (congestive heart failure) ([place])    a. 07/2015 Echo: EF 45-50%, mod LVH, mid apical/antsept AK, Gr 1 DD.  [name] CKD (chronic kidney disease), stage III ([place])   . Coronary artery disease    a. 2012 s/p MI/PCI: s/p DES to mid/dist RCA and overlapping DES to LAD;  b. 07/2015 Cath: LAD 10% ISR, D1 40(jailed), LCX 45m OM2 30, OM3 30, RCA 5109mSR, 10d ISR; c. 07/2016 NSTEMI/PCI: LAD 85ost/p/m (3.5x20 Synergy DES overlapping prior stent), D1 40, LCX 7549mM2 95 (2.75x16 Synergy DES), OM3 30, RCA 63m48md.  . Depression   . Diabetic gastroparesis (HCC)[place]. DKA (diabetic ketoacidoses) (HCC)[place]/01/2018  . GERD (gastroesophageal reflux disease)   . Gout   . Heart murmur   . Hyperlipemia   . Hypertensive heart disease   . Ischemic cardiomyopathy    a. 07/2015 Echo: EF 45-50%, mod LVH, mid-apicalanteroseptal AK, Gr1 DD.  . Morbid obesity (HCC)[place]. OSA on CPAP   . Osteomyelitis (HCC)[place] a. 07/2016 MTP joint of R great toe s/p transmetatarsal amputation.  . Polyneuropathy in diabetes(357.2)   . Pulmonary sarcoidosis (HCC)[place] "problems w/it years ago; no problems anymore" (07/03/2014)  . Rectal bleeding   . Sleep apnea   . Type II diabetes mellitus (HCC)[place] Past Surgical History:  Procedure Laterality Date  . AMPUTATION Right 07/24/2016   Procedure: TRANSMETATARSAL FOOT AMPUTATION;  Surgeon: MarcNewt Minion;  [facility];  Laterality: Right;  . BALLOON DILATION N/A 03/21/2018   Procedure: BALLOON DILATION;  Surgeon: BuccRonald Lobo;  [facility];  Service: Endoscopy;  Laterality: N/A;  . BREAST LUMPECTOMY Right ~ 2000   "benign"  . CARDIAC CATHETERIZATION N/A 07/30/2015   Procedure: Left Heart Cath and Coronary Angiography;  Surgeon: Burnell Blanks, MD;  Location: [place] CV LAB;  Service: Cardiovascular;  Laterality: N/A;  . CARDIAC CATHETERIZATION N/A 07/19/2016   Procedure: Left Heart Cath and Coronary Angiography;  Surgeon: Belva Crome, MD;  Location: [place] CV LAB;  Service: Cardiovascular;  Laterality: N/A;  . CARDIAC CATHETERIZATION N/A 07/29/2016   Procedure: Coronary Stent Intervention;  Surgeon: Belva Crome, MD;  Location: [place] CV LAB;  Service: Cardiovascular;  Laterality: N/A;  . CARDIAC CATHETERIZATION N/A 07/29/2016   Procedure: Intravascular Ultrasound/IVUS;  Surgeon: Belva Crome, MD;  Location: [place] [facility] LAB;  Service: Cardiovascular;  Laterality: N/A;  . CATARACT EXTRACTION W/ INTRAOCULAR LENS  IMPLANT, BILATERAL Bilateral 2014-2015  . COLONOSCOPY WITH PROPOFOL N/A 08/22/2017   Procedure: COLONOSCOPY WITH PROPOFOL;  Surgeon: [name], MD;  Location: [place];  Service: Endoscopy;  Laterality: N/A;  . CORONARY ANGIOPLASTY WITH STENT PLACEMENT  10/31/2011   30 % MID ATRIOVENTRICULAR GROOVE CX STENOSIS. 90 % MID RCA.PTA TO LAD/STENTING OF TANDEM 60-70%. LAD STENOSIS 99% REDUCED TO 0%.3.0 X 32MM PROMUS DES POSTDILATED TO 3.25MM.  [name] CORONARY ANGIOPLASTY WITH STENT PLACEMENT  07/03/2014   "2"  . CORONARY STENT INTERVENTION N/A 04/09/2019   Procedure: CORONARY STENT INTERVENTION;  Surgeon: Jettie Booze, MD;  Location: [place] CV LAB;  Service: Cardiovascular;  Laterality: N/A;  . ESOPHAGOGASTRODUODENOSCOPY (EGD) WITH PROPOFOL N/A 03/21/2018   Procedure: ESOPHAGOGASTRODUODENOSCOPY (EGD) WITH PROPOFOL;  Surgeon: [name], MD;  Location: [place];  Service: Endoscopy;  Laterality: N/A;  . I&D EXTREMITY Right 10/30/2013   Procedure: IRRIGATION AND DEBRIDEMENT RIGHT SMALL FINGER POSSIBLE SECONDARY WOUND CLOSURE;  Surgeon: [name], MD;  Location: [facility];  Service: Orthopedics;  Laterality: Right;  [name] is available after 4pm  . I&D EXTREMITY Right 11/01/2013   Procedure:  IRRIGATION AND DEBRIDEMENT RIGHT  PROXIMAL PHALANGEAL LEVEL AMPUTATION;  Surgeon: [name], MD;  Location: [facility];  Service: Orthopedics;  Laterality: Right;  . INCISION AND  DRAINAGE Right 10/28/2013   Procedure: INCISION AND DRAINAGE Right small finger;  Surgeon: [name], MD;  Location: [facility];  Service: Orthopedics;  Laterality: Right;  . LEFT HEART CATH Left 11/03/2011   Procedure: LEFT HEART CATH;  Surgeon: [name], MD;  [facility] C[facility] LAB;  Service: Cardiovascular;  Laterality: Left;  . LEFT HEART CATH AND CORONARY ANGIOGRAPHY N/A 04/09/2019   Procedure: LEFT HEART CATH AND CORONARY ANGIOGRAPHY;  Surgeon: Jettie Booze, MD;  Location: [place] CV LAB;  Service: Cardiovascular;  Laterality: N/A;  . LEFT HEART CATHETERIZATION WITH CORONARY ANGIOGRAM N/A 07/03/2014   Procedure: LEFT HEART CATHETERIZATION WITH CORONARY ANGIOGRAM;  Surgeon: [name], MD;  [facility];  Service: Cardiovascular;  Laterality: N/A;  . PERCUTANEOUS CORONARY STENT INTERVENTION (PCI-S) Left 11/03/2011   Procedure: PERCUTANEOUS CORONARY STENT INTERVENTION (PCI-S);  Surgeon: [name], MD;  Location: [facility] CATH LAB;  Service: Cardiovascular;  Laterality: Left;  . TOE AMPUTATION Right ~ 2010   "2nd toe"  . TOE AMPUTATION Right 2012   "3rd toe"    Social History:  reports that he has never smoked. He has never used smokeless tobacco. He reports current alcohol use. He reports that he does not use drugs.   Allergies  Allergen Reactions  . Chlorthalidone Other (  See Comments)    Causes dehydration, kidneys shut down    Family History  Problem Relation Age of Onset  . Heart attack Maternal Aunt   . Diabetes Maternal Grandmother   . Hypertension Maternal Grandmother       Prior to Admission medications   Medication Sig Start Date End Date Taking? Authorizing Provider  acetaminophen (TYLENOL) 650 MG CR tablet Take 1,300 mg by mouth daily as needed for pain.   Yes [provider]  allopurinol (ZYLOPRIM) 300 MG tablet Take 300 mg by mouth daily.    Yes [provider]  amLODipine (NORVASC) 10 MG tablet Take 1 tablet  (10 mg total) by mouth daily. 04/10/19  Yes Reino Bellis B, NP  aspirin EC 81 MG tablet Take 81 mg by mouth daily.    Yes [provider]  Camphor-Menthol-Methyl Sal (HM SALONPAS PAIN RELIEF EX) Apply 1 spray topically daily as needed (pain).   Yes [provider]  furosemide (LASIX) 40 MG tablet Take 1 tablet (40 mg total) by mouth daily. Patient taking differently: Take 40 mg by mouth 2 (two) times daily.  01/12/19  Yes Almyra Deforest, PA  hydrALAZINE (APRESOLINE) 50 MG tablet Take 1 tablet (50 mg total) by mouth 3 (three) times daily. 01/12/19  Yes Almyra Deforest, PA  insulin aspart (NOVOLOG) 100 UNIT/ML injection Inject 6 Units into the skin 3 (three) times daily with meals. Patient taking differently: Inject 10-12 Units into the skin 2 (two) times daily. Sliding scale 04/09/19  Yes Bhagat, Bhavinkumar, PA  insulin glargine (LANTUS) 100 UNIT/ML injection Inject 0.2 mLs (20 Units total) into the skin 2 (two) times daily. 04/09/19  Yes Bhagat, Bhavinkumar, PA  isosorbide mononitrate (IMDUR) 120 MG 24 hr tablet Take 1 tablet (120 mg total) by mouth 2 (two) times daily. 04/10/19  Yes Reino Bellis B, NP  metoprolol succinate (TOPROL XL) 100 MG 24 hr tablet Take 1 and 1/2 tablets by mouth daily. Patient taking differently: Take 150 mg by mouth daily.  01/12/19  Yes Almyra Deforest, PA  nitroGLYCERIN (NITROSTAT) 0.4 MG SL tablet Place 1 tablet (0.4 mg total) under the tongue every 5 (five) minutes as needed for chest pain. 01/12/19  Yes Almyra Deforest, PA  pantoprazole (PROTONIX) 40 MG tablet TAKE 1 TABLET BY MOUTH TWICE DAILY BEFORE MEAL(S) Patient taking differently: Take 40 mg by mouth daily.  05/30/19  Yes [name], MD  rosuvastatin (CRESTOR) 40 MG tablet Take 1 tablet (40 mg total) by mouth daily. 01/12/19  Yes Almyra Deforest, PA  ticagrelor (BRILINTA) 90 MG TABS tablet Take 1 tablet (90 mg total) by mouth 2 (two) times daily. 01/12/19  Yes Meng, Isaac Laud, PA  Accu-Chek FastClix Lancets MISC USE UP TO FOUR  TIMES DAILY AS DIRECTED 04/10/19   [provider]  ACCU-CHEK GUIDE test strip USE UP TO FOUR TIMES DAILY AS DIRECTED 04/10/19   [provider]  blood glucose meter kit and supplies Dispense based on patient and insurance preference. Use up to four times daily as directed. (FOR ICD-10 E10.9, E11.9). 04/09/19   Bhagat, Crista Luria, PA  silver sulfADIAZINE (SILVADENE) 1 % cream Apply pea-sized amount to wound daily. Patient not taking: Reported on 08/16/2019 05/31/19   Evelina Bucy, DPM  sulfamethoxazole-trimethoprim (BACTRIM DS) 800-160 MG tablet Take 1 tablet by mouth 2 (two) times daily. Patient not taking: Reported on 08/16/2019 05/31/19   Evelina Bucy, DPM    Physical Exam: BP 109/61   Pulse 74  Temp 98.4 F (36.9 C) (Oral)   Resp 17   Ht 5' 9" (1.753 m)   Wt (!) 149.7 kg   SpO2 96%   BMI 48.73 kg/m   General: NAD Eyes: Normal  ENT: Normal  Neck: Supple, +JVD Cardiovascular: S1, S2 present Respiratory: Bibasilar crackles noted Abdomen: Soft, obese, NT, ND, BS present  Skin: Normal Musculoskeletal: 2+ bilateral pedal edema noted Psychiatric: Normal mood Neurologic: No focal neurologic deficit noted           Labs on Admission:  Basic Metabolic Panel: Recent Labs  Lab 08/16/19 1158  NA 138  K 3.5  CL 103  CO2 20*  GLUCOSE 264*  BUN 43*  CREATININE 3.03*  CALCIUM 8.9   Liver Function Tests: No results for input(s): AST, ALT, ALKPHOS, BILITOT, PROT, ALBUMIN in the last 168 hours. No results for input(s): LIPASE, AMYLASE in the last 168 hours. No results for input(s): AMMONIA in the last 168 hours. CBC: Recent Labs  Lab 08/16/19 1158  WBC 6.8  HGB 12.5*  HCT 40.3  MCV 87.8  PLT 184   Cardiac Enzymes: No results for input(s): CKTOTAL, CKMB, CKMBINDEX, TROPONINI in the last 168 hours.  BNP (last 3 results) Recent Labs    04/06/19 1857  BNP 76.7    ProBNP (last 3 results) No results for input(s): PROBNP in the last 8760 hours.   CBG: No results for input(s): GLUCAP in the last 168 hours.  Radiological Exams on Admission: Dg Chest 2 View  Result Date: 08/16/2019 CLINICAL DATA:  Left-sided chest pain with shortness of breath for 1 week. EXAM: CHEST - 2 VIEW COMPARISON:  April 06, 2019 FINDINGS: Cardiomediastinal silhouette is normal. Mediastinal contours appear intact. There is no evidence of focal airspace consolidation, pleural effusion or pneumothorax. Osseous structures are without acute abnormality. Stigmata of diffuse idiopathic skeletal hyperostosis of the thoracic spine. Soft tissues are grossly normal. IMPRESSION: No active cardiopulmonary disease. Electronically Signed   By: Fidela Salisbury M.D.   On: 08/16/2019 12:31    EKG: Independently reviewed. No acute ST changes  Assessment/Plan Present on Admission: . Chest pain . Hyperlipidemia . Sleep apnea- on C-pap . Benign essential HTN . GERD . Morbid obesity due to excess calories ([place]) . Unstable angina pectoris ([place]) . CKD (chronic kidney disease) stage 3, GFR 30-59 ml/min (HCC) . Essential hypertension  Principal Problem:   Chest pain Active Problems:   Hyperlipidemia   Benign essential HTN   GERD   Morbid obesity due to excess calories (HCC)   CAD S/P multiple PCIs   Sleep apnea- on C-pap   Unstable angina pectoris (HCC)   CKD (chronic kidney disease) stage 3, GFR 30-59 ml/min (HCC)   Essential hypertension   Likely unstable angina with extensive CAD hx s/p multiple stents Hx as above Trop 12-->14 EKG with no acute ST changes CXR unremarkable Last cath done in 03/2019 showed Ost LAD to Prox LAD lesion 90% stenosed in-stent restenosis s/p balloon angioplasty Cardiology on board, recommend starting IV heparin, and plan for cath once renal fxn improves Continue NTG prn, metoprolol, amlodipine, imdur, ASA, brillinta, crestor Telemetry, monitor closely  Acute on chronic diastolic HF Appears overloaded BNP pending Last ECHO on 03/2019-  EF of 55-60% Cardiology on board Start IV diuresis, with close monitoring of renal function Strict I&O, daily weight  AKI on CKD stage 3 Baseline Cr around 2, currently elevated above baseline ?renal vascular congestion Continue diuresis with close monitoring of renal fxn Consider consulting  nephrology if worsens Daily BMP  Diabetes mellitus type 2 Poorly controlled Last A1c was 15, repeat pending Continue home regimen with glargine, novolog tid, SSI, accuchecks, hypoglycemic protocol  HTN Stable Continue home regimen with metoprolol, amlodipine, imdur, held hydralazine as BP somewhat soft, consider restarting  HLD Lipid panel in am Continue crestor  GERD Continue PPI BID  Gout Stable Continue Allopurinol  OSA Continue cpap  Morbid obesity Lifestyle modification advised     DVT prophylaxis: Heparin drip  Code Status: Full  Family Communication: None at bedside  Disposition Plan: Likely home once work up complete   Consults called: Cardiology   Admission status: Observation    Alma Friendly MD [facility]  If 7PM-7AM, please contact night-coverage [email or web address]  08/16/2019, 6:14 PM

## 2019-08-16 NOTE — ED Provider Notes (Signed)
Cardiology has consulted  - they request admit to hospitalist service for r/o, management diabetes, etc.  Hospitalists consulted.      Lajean Saver, MD 08/16/19 [phone]

## 2019-08-16 NOTE — Consult Note (Signed)
Cardiology Consultation:   Patient ID: Joshua [name] MRN: [id number]; DOB: 1956/08/14  Admit date: 08/16/2019 Date of Consult: 08/16/2019  Primary Care Provider: [name], MD Primary Cardiologist: [name], MD  Primary Electrophysiologist:  None    Patient Profile:   Joshua [name] is a 63 y.o. male with a hx of long-standing CAD with multiple PCIs, intermittent medication noncompliance, HTN, DM2, obesity, HDL, CKD, Sleep apnea, morbid obesity who is being seen today for the evaluation of chest pain at the request of Dr. Golden Pop.  History of Present Illness:  Patient follows with Dr. Claiborne Billings for the above cardiac problems.  Mr. Drab had an anterior lateral STEMI November 2012 which was treated with DES to the midLAD with staged PCI of mid RCA with another drug-eluting stent. He had unstable angina in July 2015 with DES to distal RCA and proximal LAD. He underwent a cath in 2016 suggestive of fluid overload requiring diuresis. He had another cardiac catheterization in August 2017 resulting in DES to LAD and OM2. Patient was admitted 4/24 - 4/28 earlier this year for NSTEMI with normal EF. Cath showed in stent restenosis of ost to pro LAD S/p Scoring balloon angioplasty was performed using a BALLOON WOLVERINE 3.75X10, followed by 4.0 [place] balloon He admitted to smotimes missing his second dose of Brilinta. There are also periodic comments in office notes indicating intermittent noncompliance with other medicines such as PPI and Imdur so suspect compliance issues. Statin, DAPT, and BB were continued. Patient last had a tele visit with Dr. Claiborne Billings 04/12/19 and was stable with no further anginal symptoms. Patient had not been using his CPAP at that time. Of note patient has a history of admission for DKA and AKI with creatinine over 5.   Patient presented to the ER 08/16/19 for intermittent chest pain for the last 7-10 days that has been more frequent over the last 2 days. Pain is described as a  heavy pressure in the mid sternal area and non-radiating. Patient had been feeling this pain on and off for the last couple months but just recently began to notice it more frequent. He denies associated shortness of breath. At it's peak pain was 7/10. Patient took NTG about 3 times over the last week which completely relieved the pain. Last night patient was unable to sleep because the pain was worse while laying down and NTG moderately improved the pain. This AM patient felt the same pain and was minimally relieved with NTG. He called his PCP care office who recommended he go to the ER. Patient denies N/V or diaphoresis. Patient also noted an increase in lower leg edema. He saw his nephrologist on Friday who increased his dose of lasix to 80 mg daily. He noted his weight at that time was 329 lbs.   In the ER BP 157/77, pulse 88, temp 98.4, Resp 18. Pitting edema noted one exam. Potassium 3.5, Glocuse 264, Creatinine 3.03, GFR 25. WBC 6.8, Hgb 12.5. HS troponin 12 > 14. CXR showed no active disease. EKG showed NSR, 88 bpm, with no ischemic changes. Cardiology was consulted.   Since being in the ER patient has not had chest pain. He admits he has been compliant with his medications and has tried to be compliant with his diet. He says his weight has increased by about 25 lbs in the last couple months. He does admit to new increased alcohol use 3-4 beers nightly. Denies tobacco/drug use.   Heart Pathway Score:  Past Medical History:  Diagnosis Date  . Anemia   . Chronic combined systolic and diastolic CHF (congestive heart failure) ([place])    a. 07/2015 Echo: EF 45-50%, mod LVH, mid apical/antsept AK, Gr 1 DD.  [name] CKD (chronic kidney disease), stage III ([place])   . Coronary artery disease    a. 2012 s/p MI/PCI: s/p DES to mid/dist RCA and overlapping DES to LAD;  b. 07/2015 Cath: LAD 10% ISR, D1 40(jailed), LCX 22m OM2 30, OM3 30, RCA 524mSR, 10d ISR; c. 07/2016 NSTEMI/PCI: LAD 85ost/p/m (3.5x20 Synergy DES  overlapping prior stent), D1 40, LCX 7588mM2 95 (2.75x16 Synergy DES), OM3 30, RCA 76m71md.  . Depression   . Diabetic gastroparesis (HCC)[place]. DKA (diabetic ketoacidoses) (HCC)[place]/01/2018  . GERD (gastroesophageal reflux disease)   . Gout   . Heart murmur   . Hyperlipemia   . Hypertensive heart disease   . Ischemic cardiomyopathy    a. 07/2015 Echo: EF 45-50%, mod LVH, mid-apicalanteroseptal AK, Gr1 DD.  . Morbid obesity (HCC)[place]. OSA on CPAP   . Osteomyelitis (HCC)[place] a. 07/2016 MTP joint of R great toe s/p transmetatarsal amputation.  . Polyneuropathy in diabetes(357.2)   . Pulmonary sarcoidosis (HCC)[place] "problems w/it years ago; no problems anymore" (07/03/2014)  . Rectal bleeding   . Sleep apnea   . Type II diabetes mellitus (HCC)[place]  Past Surgical History:  Procedure Laterality Date  . AMPUTATION Right 07/24/2016   Procedure: TRANSMETATARSAL FOOT AMPUTATION;  Surgeon: MarcNewt Minion;  [facility];  Laterality: Right;  . BALLOON DILATION N/A 03/21/2018   Procedure: BALLOON DILATION;  Surgeon: BuccRonald Lobo;  [facility];  Service: Endoscopy;  Laterality: N/A;  . BREAST LUMPECTOMY Right ~ 2000   "benign"  . CARDIAC CATHETERIZATION N/A 07/30/2015   Procedure: Left Heart Cath and Coronary Angiography;  Surgeon: ChriBurnell Blanks;  [facility];  Service: Cardiovascular;  Laterality: N/A;  . CARDIAC CATHETERIZATION N/A 07/19/2016   Procedure: Left Heart Cath and Coronary Angiography;  Surgeon: HenrBelva Crome;  [facility];  Service: Cardiovascular;  Laterality: N/A;  . CARDIAC CATHETERIZATION N/A 07/29/2016   Procedure: Coronary Stent Intervention;  Surgeon: HenrBelva Crome;  [facility];  Service: Cardiovascular;  Laterality: N/A;  . CARDIAC CATHETERIZATION N/A 07/29/2016   Procedure: Intravascular Ultrasound/IVUS;  Surgeon: HenrBelva Crome;  [facility];   Service: Cardiovascular;  Laterality: N/A;  . CATARACT EXTRACTION W/ INTRAOCULAR LENS  IMPLANT, BILATERAL Bilateral 2014-2015  . COLONOSCOPY WITH PROPOFOL N/A 08/22/2017   Procedure: COLONOSCOPY WITH PROPOFOL;  Surgeon: SchoWilford Corner;  [facility]oscopy;  Laterality: N/A;  . CORONARY ANGIOPLASTY WITH STENT PLACEMENT  10/31/2011   30 % MID ATRIOVENTRICULAR GROOVE CX STENOSIS. 90 % MID RCA.PTA TO LAD/STENTING OF TANDEM 60-70%. LAD STENOSIS 99% REDUCED TO 0%.3.0 X 32MM PROMUS DES POSTDILATED TO 3.25MM.  . [place] KitchenONARY ANGIOPLASTY WITH STENT PLACEMENT  07/03/2014   "2"  . CORONARY STENT INTERVENTION N/A 04/09/2019   Procedure: CORONARY STENT INTERVENTION;  Surgeon: VaraJettie Booze;  [facility];  Service: Cardiovascular;  Laterality: N/A;  . ESOPHAGOGASTRODUODENOSCOPY (EGD) WITH PROPOFOL N/A 03/21/2018   Procedure: ESOPHAGOGASTRODUODENOSCOPY (EGD) WITH PROPOFOL;  Surgeon: BuccRonald Lobo;  [facility] Endoscopy;  Laterality: N/A;  .  I&D EXTREMITY Right 10/30/2013   Procedure: IRRIGATION AND DEBRIDEMENT RIGHT SMALL FINGER POSSIBLE SECONDARY WOUND CLOSURE;  Surgeon: [name], MD;  Location: [facility] ORS;  Service: Orthopedics;  Laterality: Right;  [name] is available after 4pm  . I&D EXTREMITY Right 11/01/2013   Procedure:  IRRIGATION AND DEBRIDEMENT RIGHT  PROXIMAL PHALANGEAL LEVEL AMPUTATION;  Surgeon: [name], MD;  Location: [facility];  Service: Orthopedics;  Laterality: Right;  . INCISION AND DRAINAGE Right 10/28/2013   Procedure: INCISION AND DRAINAGE Right small finger;  Surgeon: [name], MD;  Location: [facility];  Service: Orthopedics;  Laterality: Right;  . LEFT HEART CATH Left 11/03/2011   Procedure: LEFT HEART CATH;  Surgeon: [name], MD;  [facility];  Service: Cardiovascular;  Laterality: Left;  . LEFT HEART CATH AND CORONARY ANGIOGRAPHY N/A 04/09/2019   Procedure: LEFT HEART  CATH AND CORONARY ANGIOGRAPHY;  Surgeon: Jettie Booze, MD;  Location: [place] CV LAB;  Service: Cardiovascular;  Laterality: N/A;  . LEFT HEART CATHETERIZATION WITH CORONARY ANGIOGRAM N/A 07/03/2014   Procedure: LEFT HEART CATHETERIZATION WITH CORONARY ANGIOGRAM;  Surgeon: [name], MD;  Location: [facility] CATH LAB;  Service: Cardiovascular;  Laterality: N/A;  . PERCUTANEOUS CORONARY STENT INTERVENTION (PCI-S) Left 11/03/2011   Procedure: PERCUTANEOUS CORONARY STENT INTERVENTION (PCI-S);  Surgeon: [name], MD;  Location: [facility] CATH LAB;  Service: Cardiovascular;  Laterality: Left;  . TOE AMPUTATION Right ~ 2010   "2nd toe"  . TOE AMPUTATION Right 2012   "3rd toe"     Home Medications:  Prior to Admission medications   Medication Sig Start Date End Date Taking? Authorizing Provider  acetaminophen (TYLENOL) 650 MG CR tablet Take 1,300 mg by mouth daily as needed for pain.   Yes [provider]  allopurinol (ZYLOPRIM) 300 MG tablet Take 300 mg by mouth daily.    Yes [provider]  amLODipine (NORVASC) 10 MG tablet Take 1 tablet (10 mg total) by mouth daily. 04/10/19  Yes Reino Bellis B, NP  aspirin EC 81 MG tablet Take 81 mg by mouth daily.    Yes [provider]  Camphor-Menthol-Methyl Sal (HM SALONPAS PAIN RELIEF EX) Apply 1 spray topically daily as needed (pain).   Yes [provider]  furosemide (LASIX) 40 MG tablet Take 1 tablet (40 mg total) by mouth daily. Patient taking differently: Take 40 mg by mouth 2 (two) times daily.  01/12/19  Yes Almyra Deforest, PA  hydrALAZINE (APRESOLINE) 50 MG tablet Take 1 tablet (50 mg total) by mouth 3 (three) times daily. 01/12/19  Yes Almyra Deforest, PA  insulin aspart (NOVOLOG) 100 UNIT/ML injection Inject 6 Units into the skin 3 (three) times daily with meals. Patient taking differently: Inject 10-12 Units into the skin 2 (two) times daily. Sliding scale 04/09/19  Yes Bhagat, Bhavinkumar, PA  insulin  glargine (LANTUS) 100 UNIT/ML injection Inject 0.2 mLs (20 Units total) into the skin 2 (two) times daily. 04/09/19  Yes Bhagat, Bhavinkumar, PA  isosorbide mononitrate (IMDUR) 120 MG 24 hr tablet Take 1 tablet (120 mg total) by mouth 2 (two) times daily. 04/10/19  Yes Reino Bellis B, NP  metoprolol succinate (TOPROL XL) 100 MG 24 hr tablet Take 1 and 1/2 tablets by mouth daily. Patient taking differently: Take 150 mg by mouth daily.  01/12/19  Yes Almyra Deforest, PA  nitroGLYCERIN (NITROSTAT) 0.4 MG SL tablet Place 1 tablet (0.4 mg total) under the tongue every 5 (five) minutes  as needed for chest pain. 01/12/19  Yes Almyra Deforest, PA  pantoprazole (PROTONIX) 40 MG tablet TAKE 1 TABLET BY MOUTH TWICE DAILY BEFORE MEAL(S) Patient taking differently: Take 40 mg by mouth daily.  05/30/19  Yes [name], MD  rosuvastatin (CRESTOR) 40 MG tablet Take 1 tablet (40 mg total) by mouth daily. 01/12/19  Yes Almyra Deforest, PA  ticagrelor (BRILINTA) 90 MG TABS tablet Take 1 tablet (90 mg total) by mouth 2 (two) times daily. 01/12/19  Yes Meng, Isaac Laud, PA  Accu-Chek FastClix Lancets MISC USE UP TO FOUR TIMES DAILY AS DIRECTED 04/10/19   [provider]  ACCU-CHEK GUIDE test strip USE UP TO FOUR TIMES DAILY AS DIRECTED 04/10/19   [provider]  blood glucose meter kit and supplies Dispense based on patient and insurance preference. Use up to four times daily as directed. (FOR ICD-10 E10.9, E11.9). 04/09/19   Bhagat, Crista Luria, PA  silver sulfADIAZINE (SILVADENE) 1 % cream Apply pea-sized amount to wound daily. Patient not taking: Reported on 08/16/2019 05/31/19   Evelina Bucy, DPM  sulfamethoxazole-trimethoprim (BACTRIM DS) 800-160 MG tablet Take 1 tablet by mouth 2 (two) times daily. Patient not taking: Reported on 08/16/2019 05/31/19   Evelina Bucy, DPM    Inpatient Medications: Scheduled Meds: . sodium chloride flush  3 mL Intravenous Once   Continuous Infusions:  PRN Meds:   Allergies:     Allergies  Allergen Reactions  . Chlorthalidone Other (See Comments)    Causes dehydration, kidneys shut down    Social History:   Social History   Socioeconomic History  . Marital status: Married    Spouse name: Not on file  . Number of children: Not on file  . Years of education: Not on file  . Highest education level: Not on file  Occupational History  . Not on file  Social Needs  . Financial resource strain: Not on file  . Food insecurity    Worry: Not on file    Inability: Not on file  . Transportation needs    Medical: Not on file    Non-medical: Not on file  Tobacco Use  . Smoking status: Never Smoker  . Smokeless tobacco: Never Used  Substance and Sexual Activity  . Alcohol use: Yes    Comment: social   . Drug use: No  . Sexual activity: Yes  Lifestyle  . Physical activity    Days per week: Not on file    Minutes per session: Not on file  . Stress: Not on file  Relationships  . Social Herbalist on phone: Not on file    Gets together: Not on file    Attends religious service: Not on file    Active member of club or organization: Not on file    Attends meetings of clubs or organizations: Not on file    Relationship status: Not on file  . Intimate partner violence    Fear of current or ex partner: Not on file    Emotionally abused: Not on file    Physically abused: Not on file    Forced sexual activity: Not on file  Other Topics Concern  . Not on file  Social History Narrative  . Not on file    Family History:   Family History  Problem Relation Age of Onset  . Heart attack Maternal Aunt   . Diabetes Maternal Grandmother   . Hypertension Maternal Grandmother  ROS:  Please see the history of present illness.  All other ROS reviewed and negative.     Physical Exam/Data:   Vitals:   08/16/19 1400 08/16/19 1415 08/16/19 1430 08/16/19 1445  BP: (!) 147/80 (!) 132/106 139/81 137/71  Pulse: 78     Resp: 20 18 (!) 22 17  Temp:       TempSrc:      SpO2: 96%      No intake or output data in the 24 hours ending 08/16/19 1453 Last 3 Weights 04/19/2019 04/12/2019 04/09/2019  Weight (lbs) 304 lb 304 lb 304 lb 1.6 oz  Weight (kg) 137.893 kg 137.893 kg 137.939 kg     There is no height or weight on file to calculate BMI.  General:  Well nourished, well developed obese AAM, in no acute distress HEENT: normal Lymph: no adenopathy Neck: unable to assess JVD due to body habitus Endocrine:  No thryomegaly Vascular: No carotid bruits; FA pulses 2+ bilaterally without bruits  Cardiac:  normal S1, S2; RRR; no murmur  Lungs:  clear to auscultation bilaterally, no wheezing, rhonchi or rales  Abd: soft, nontender, no hepatomegaly  Ext: 1+ B/L edema Musculoskeletal:  No deformities, BUE and BLE strength normal and equal Skin: warm and dry  Neuro:  CNs 2-12 intact, no focal abnormalities noted Psych:  Normal affect   EKG:  The EKG was personally reviewed and demonstrates:  NSR, 88 bpm, first degress AV block, poor R wave progression Telemetry:  Telemetry was personally reviewed and demonstrates:  NSR, rates in the 70-80s, no other arhythmia noted  Relevant CV Studies:  Echo 04/07/2019 IMPRESSIONS  1. The left ventricle has normal systolic function, with an ejection fraction of 55-60%. The cavity size was normal. Indeterminate diastolic filling due to E-A fusion. 2. LV regional wall motion abnormalities could not be completely assessed due to suboptimal acoustic windows. Grossly normal wall motion. 3. The inferior vena cava was dilated in size with <50% respiratory variability.  CORONARY STENT INTERVENTION 04/09/2019  LEFT HEART CATH AND CORONARY ANGIOGRAPHY  Conclusion     Previously placed Mid LAD drug eluting stent is widely patent.  Ost 1st Diag to 1st Diag lesion is 40% stenosed.  Dist LAD lesion is 10% stenosed.  Previously placed 2nd Mrg drug eluting stent is widely patent.  3rd Mrg lesion is 30% stenosed.   Mid Cx lesion is 50% stenosed.  RPDA lesion is 30% stenosed.  Prox RCA to Mid RCA lesion is 40% stenosed.  Mid RCA lesion is 25% stenosed.  LV end diastolic pressure is normal.  There is no aortic valve stenosis.  Ost LAD to Prox LAD lesion is 90% stenosed. In stent restenosis.  Scoring balloon angioplasty was performed using a BALLOON WOLVERINE 3.75X10, followed by 4.0 [place] balloon.  Prox LAD lesion is 90% stenosed.  Post intervention, there is a 0% residual stenosis.     Laboratory Data:  High Sensitivity Troponin:   Recent Labs  Lab 08/16/19 1158  TROPONINIHS 12     Chemistry Recent Labs  Lab 08/16/19 1158  NA 138  K 3.5  CL 103  CO2 20*  GLUCOSE 264*  BUN 43*  CREATININE 3.03*  CALCIUM 8.9  GFRNONAA 21*  GFRAA 25*  ANIONGAP 15    No results for input(s): PROT, ALBUMIN, AST, ALT, ALKPHOS, BILITOT in the last 168 hours. Hematology Recent Labs  Lab 08/16/19 1158  WBC 6.8  RBC 4.59  HGB 12.5*  HCT 40.3  MCV 87.8  MCH 27.2  MCHC 31.0  RDW 16.1*  PLT 184   BNPNo results for input(s): BNP, PROBNP in the last 168 hours.  DDimer No results for input(s): DDIMER in the last 168 hours.   Radiology/Studies:  Dg Chest 2 View  Result Date: 08/16/2019 CLINICAL DATA:  Left-sided chest pain with shortness of breath for 1 week. EXAM: CHEST - 2 VIEW COMPARISON:  April 06, 2019 FINDINGS: Cardiomediastinal silhouette is normal. Mediastinal contours appear intact. There is no evidence of focal airspace consolidation, pleural effusion or pneumothorax. Osseous structures are without acute abnormality. Stigmata of diffuse idiopathic skeletal hyperostosis of the thoracic spine. Soft tissues are grossly normal. IMPRESSION: No active cardiopulmonary disease. Electronically Signed   By: Fidela Salisbury M.D.   On: 08/16/2019 12:31    Assessment and Plan:   Chest pain/CAD s/p multiple PCI on Brilinta Patient presented to ER for chest pain for the last 10 days, worse  on exertion and relieved by NTG. EKG did not show any new ischemic changes. HS troponin 12 > 14 - Patient is currently chest pain free - Admit for observation on cardiac telemetry - Trend troponins - 03/2019 Echo with normal EF  - Continue BB, statin, ASA, and Imdur 120 mg BID  - Creatinine elevated on admission (3.0). Would avoid cardiac cath at this time. MD to see  Chronic Diastolic Heart Failure Last limited echo 03/2019 was 55-60% with indeterminate diastolic filling due to E-A fusion, grossly normal wall motion, dilated IVC. Echo in 2016 noted G1DD.  - Worsening leg edema for the last couple days and pitting edema noted on exam. Denies SOB - Home med include lasix 40 mg daily and Toprol 100 mg 1.5 tabs daily - Patient says he has been relatively compliant with his diet and his medications. - Discharge weight in April was 304 lbs. Patient reported a weight of 329 lbs on Friday at his nephrologist's office. Nephrologist increased Lasix to 80 mg daily at that time. Creatinine now 3.03 (baseline around 2). Unsure if this is due to lasix change or extra fluid.  - Will check BNP - Would possible benefit from IV diuresis - Strict I/Os - Daily weights - Recommend IV Lasix 80 mg daily and monitor creatinine  Noncompliance - H/o of patient reporting not taking medications or running out of meds - Patient says he has been compliant since his hospitalization in April  AKI/CKD III - Creatinine on admission 3.03. Creatinine at last discharge in April was 2.01 - Unsure if the recent increase is due to increase dose in lasix or fluid overload - Baseline Creatinine noted around 1.7 - 1.9 - Avoid nephrotoxic agents - Might need to get nephrology on board if creatinine worsens  Uncontrolled DM2 - Glucose in the 200s on admission. A1C in April was 15 - H/o of noncompliance - Will repeat A1C  HLD - Crestor 40 mg - No recent FLP on record - recheck in the AM  HTN Home meds include amlodipine  10 mg daily, hydralazine 50 mg daily, Toprol 100 mg 1.5 tablets daily - Bps elevated on arrival, slowly improving   For questions or updates, please contact [place] Please consult [email or web address] for contact info under     Signed, Mattye Verdone Ninfa Meeker, PA-C  08/16/2019 2:53 PM

## 2019-08-16 NOTE — Telephone Encounter (Signed)
Spoke with pt, he reports chest pain which he was told was his angina equivilant. He reports pain in his chest every time he lays down and if sits up he feels better. This is exactly the same situation as when he had his stent in April and is progressively getting worse. He took 1 NTG last night and this morning and can not tell if helps or not because if he lays down the pain comes back. He will have someone take him to [place] for evaluation.

## 2019-08-16 NOTE — Telephone Encounter (Signed)
New Message     Pt c/o of Chest Pain: STAT if CP now or developed within 24 hours  1. Are you having CP right now? Yes   2. Are you experiencing any other symptoms (ex. SOB, nausea, vomiting, sweating)? SOB always  3. How long have you been experiencing CP? About a month just was as frequent as it's been in the last couple days   4. Is your CP continuous or coming and going? Happens when patient lays down  5. Have you taken Nitroglycerin? Yes  ?

## 2019-08-16 NOTE — ED Notes (Signed)
ED TO INPATIENT HANDOFF REPORT  ED Nurse Name and Phone #:  Mylan RN [phone]  S Name/Age/Gender Joshua [name] 63 y.o. male Room/Bed: 020C/020C  Code Status   Code Status: Prior  Home/SNF/Other Home Patient oriented to: self, place, time and situation Is this baseline? Yes   Triage Complete: Triage complete  Chief Complaint sent by dr/cp  Triage Note Pt reports having mid chest pain for several days, has become more severe x 2 days. Pt has swelling to legs and sob today. Has cardiac hx.    Allergies Allergies  Allergen Reactions  . Chlorthalidone Other (See Comments)    Causes dehydration, kidneys shut down    Level of Care/Admitting Diagnosis ED Disposition    ED Disposition Condition [facility] Area: [place] [[id number]]  Level of Care: Telemetry Cardiac [103]  I expect the patient will be discharged within 24 hours: No (not a candidate for 5C-Observation unit)  Covid Evaluation: Confirmed COVID Negative  Diagnosis: Chest pain [401027]  Admitting Physician: Alma Friendly [[id number]]  Attending Physician: Alma Friendly [[id number]]  PT Class (Do Not Modify): Observation [104]  PT Acc Code (Do Not Modify): Observation [10022]       B Medical/Surgery History Past Medical History:  Diagnosis Date  . Anemia   . Chronic combined systolic and diastolic CHF (congestive heart failure) ([place])    a. 07/2015 Echo: EF 45-50%, mod LVH, mid apical/antsept AK, Gr 1 DD.  [name] CKD (chronic kidney disease), stage III ([place])   . Coronary artery disease    a. 2012 s/p MI/PCI: s/p DES to mid/dist RCA and overlapping DES to LAD;  b. 07/2015 Cath: LAD 10% ISR, D1 40(jailed), LCX 70m, OM2 30, OM3 30, RCA 106m ISR, 10d ISR; c. 07/2016 NSTEMI/PCI: LAD 85ost/p/m (3.5x20 Synergy DES overlapping prior stent), D1 40, LCX 80m, OM2 95 (2.75x16 Synergy DES), OM3 30, RCA 37m, 10d.  . Depression   . Diabetic gastroparesis ([place])   . DKA (diabetic ketoacidoses)  ([place]) 03/14/2018  . GERD (gastroesophageal reflux disease)   . Gout   . Heart murmur   . Hyperlipemia   . Hypertensive heart disease   . Ischemic cardiomyopathy    a. 07/2015 Echo: EF 45-50%, mod LVH, mid-apicalanteroseptal AK, Gr1 DD.  . Morbid obesity ([place])   . OSA on CPAP   . Osteomyelitis ([place])    a. 07/2016 MTP joint of R great toe s/p transmetatarsal amputation.  . Polyneuropathy in diabetes(357.2)   . Pulmonary sarcoidosis ([place])    "problems w/it years ago; no problems anymore" (07/03/2014)  . Rectal bleeding   . Sleep apnea   . Type II diabetes mellitus ([place])    Past Surgical History:  Procedure Laterality Date  . AMPUTATION Right 07/24/2016   Procedure: TRANSMETATARSAL FOOT AMPUTATION;  Surgeon: [name], MD;  Location: [place];  Service: Orthopedics;  Laterality: Right;  . BALLOON DILATION N/A 03/21/2018   Procedure: BALLOON DILATION;  Surgeon: [name], MD;  Location: [facility] ENDOSCOPY;  Service: Endoscopy;  Laterality: N/A;  . BREAST LUMPECTOMY Right ~ 2000   "benign"  . CARDIAC CATHETERIZATION N/A 07/30/2015   Procedure: Left Heart Cath and Coronary Angiography;  Surgeon: Burnell Blanks, MD;  Location: [place] CV LAB;  Service: Cardiovascular;  Laterality: N/A;  . CARDIAC CATHETERIZATION N/A 07/19/2016   Procedure: Left Heart Cath and Coronary Angiography;  Surgeon: Belva Crome, MD;  Location: [place] CV LAB;  Service: Cardiovascular;  Laterality: N/A;  . CARDIAC CATHETERIZATION N/A 07/29/2016   Procedure: Coronary Stent Intervention;  Surgeon: Belva Crome, MD;  Location: [place] CV LAB;  Service: Cardiovascular;  Laterality: N/A;  . CARDIAC CATHETERIZATION N/A 07/29/2016   Procedure: Intravascular Ultrasound/IVUS;  Surgeon: Belva Crome, MD;  Location: [place] CV LAB;  Service: Cardiovascular;  Laterality: N/A;  . CATARACT EXTRACTION W/ INTRAOCULAR LENS  IMPLANT, BILATERAL Bilateral 2014-2015  . COLONOSCOPY WITH PROPOFOL N/A 08/22/2017    Procedure: COLONOSCOPY WITH PROPOFOL;  Surgeon: [name], MD;  Location: [place];  Service: Endoscopy;  Laterality: N/A;  . CORONARY ANGIOPLASTY WITH STENT PLACEMENT  10/31/2011   30 % MID ATRIOVENTRICULAR GROOVE CX STENOSIS. 90 % MID RCA.PTA TO LAD/STENTING OF TANDEM 60-70%. LAD STENOSIS 99% REDUCED TO 0%.3.0 X 32MM PROMUS DES POSTDILATED TO 3.25MM.  [name] CORONARY ANGIOPLASTY WITH STENT PLACEMENT  07/03/2014   "2"  . CORONARY STENT INTERVENTION N/A 04/09/2019   Procedure: CORONARY STENT INTERVENTION;  Surgeon: Jettie Booze, MD;  Location: [place] CV LAB;  Service: Cardiovascular;  Laterality: N/A;  . ESOPHAGOGASTRODUODENOSCOPY (EGD) WITH PROPOFOL N/A 03/21/2018   Procedure: ESOPHAGOGASTRODUODENOSCOPY (EGD) WITH PROPOFOL;  Surgeon: [name], MD;  Location: [place];  Service: Endoscopy;  Laterality: N/A;  . I&D EXTREMITY Right 10/30/2013   Procedure: IRRIGATION AND DEBRIDEMENT RIGHT SMALL FINGER POSSIBLE SECONDARY WOUND CLOSURE;  Surgeon: [name], MD;  Location: [facility];  Service: Orthopedics;  Laterality: Right;  [name] is available after 4pm  . I&D EXTREMITY Right 11/01/2013   Procedure:  IRRIGATION AND DEBRIDEMENT RIGHT  PROXIMAL PHALANGEAL LEVEL AMPUTATION;  Surgeon: [name], MD;  Location: [facility];  Service: Orthopedics;  Laterality: Right;  . INCISION AND DRAINAGE Right 10/28/2013   Procedure: INCISION AND DRAINAGE Right small finger;  Surgeon: [name], MD;  Location: [facility];  Service: Orthopedics;  Laterality: Right;  . LEFT HEART CATH Left 11/03/2011   Procedure: LEFT HEART CATH;  Surgeon: [name], MD;  Location: [facility] CATH LAB;  Service: Cardiovascular;  Laterality: Left;  . LEFT HEART CATH AND CORONARY ANGIOGRAPHY N/A 04/09/2019   Procedure: LEFT HEART CATH AND CORONARY ANGIOGRAPHY;  Surgeon: Jettie Booze, MD;  Location: [place] CV LAB;  Service: Cardiovascular;  Laterality: N/A;  . LEFT HEART CATHETERIZATION  WITH CORONARY ANGIOGRAM N/A 07/03/2014   Procedure: LEFT HEART CATHETERIZATION WITH CORONARY ANGIOGRAM;  Surgeon: [name], MD;  Location: [facility] CATH LAB;  Service: Cardiovascular;  Laterality: N/A;  . PERCUTANEOUS CORONARY STENT INTERVENTION (PCI-S) Left 11/03/2011   Procedure: PERCUTANEOUS CORONARY STENT INTERVENTION (PCI-S);  Surgeon: [name], MD;  Location: [facility] CATH LAB;  Service: Cardiovascular;  Laterality: Left;  . TOE AMPUTATION Right ~ 2010   "2nd toe"  . TOE AMPUTATION Right 2012   "3rd toe"     A IV Location/Drains/Wounds Patient Lines/Drains/Airways Status   Active Line/Drains/Airways    Name:   Placement date:   Placement time:   Site:   Days:   Peripheral IV 08/16/19 Right Forearm   08/16/19    1506    Forearm   less than 1          Intake/Output Last 24 hours No intake or output data in the 24 hours ending 08/16/19 1941  Labs/Imaging Results for orders placed or performed during the hospital encounter of 08/16/19 (from the past 48 hour(s))  Basic metabolic panel     Status: Abnormal   Collection Time: 08/16/19 11:58 AM  Result Value Ref Range   Sodium 138 135 - 145 mmol/L   Potassium 3.5 3.5 - 5.1 mmol/L   Chloride 103 98 - 111 mmol/L   CO2 20 (L) 22 - 32 mmol/L   Glucose, Bld 264 (H) 70 - 99 mg/dL   BUN 43 (H) 6 - 20 mg/dL   Creatinine, Ser 3.03 (H) 0.61 - 1.24 mg/dL   Calcium 8.9 8.9 - 10.3 mg/dL   GFR calc non Af Amer 21 (L) >60 mL/min   GFR calc Af Amer 25 (L) >60 mL/min   Anion gap 15 5 - 15    Comment: Performed at [facility] [address] [place], [place] [address]  CBC     Status: Abnormal   Collection Time: 08/16/19 11:58 AM  Result Value Ref Range   WBC 6.8 4.0 - 10.5 K/uL   RBC 4.59 4.22 - 5.81 MIL/uL   Hemoglobin 12.5 (L) 13.0 - 17.0 g/dL   HCT 40.3 39.0 - 52.0 %   MCV 87.8 80.0 - 100.0 fL   MCH 27.2 26.0 - 34.0 pg   MCHC 31.0 30.0 - 36.0 g/dL   RDW 16.1 (H) 11.5 - 15.5 %   Platelets 184 150 - 400 K/uL   nRBC 0.0  0.0 - 0.2 %    Comment: Performed at [facility], [place] [address] [place], [place] [address]  Troponin I (High Sensitivity)     Status: None   Collection Time: 08/16/19 11:58 AM  Result Value Ref Range   Troponin I (High Sensitivity) 12 <18 ng/L    Comment: (NOTE) Elevated high sensitivity troponin I (hsTnI) values and significant  changes across serial measurements may suggest ACS but many other  chronic and acute conditions are known to elevate hsTnI results.  Refer to the "Links" section for chest pain algorithms and additional  guidance. Performed at [facility], [place] [address] [place], [place] [address]   Troponin I (High Sensitivity)     Status: None   Collection Time: 08/16/19  2:11 PM  Result Value Ref Range   Troponin I (High Sensitivity) 14 <18 ng/L    Comment: (NOTE) Elevated high sensitivity troponin I (hsTnI) values and significant  changes across serial measurements may suggest ACS but many other  chronic and acute conditions are known to elevate hsTnI results.  Refer to the "Links" section for chest pain algorithms and additional  guidance. Performed at [facility], [place] [address], [place], [place] [address]   SARS Coronavirus 2 [facility] order, Performed in [facility] hospital lab) Nasopharyngeal Nasopharyngeal Swab     Status: None   Collection Time: 08/16/19  2:50 PM   Specimen: Nasopharyngeal Swab  Result Value Ref Range   SARS Coronavirus 2 NEGATIVE NEGATIVE    Comment: (NOTE) If result is NEGATIVE SARS-CoV-2 target nucleic acids are NOT DETECTED. The SARS-CoV-2 RNA is generally detectable in upper and lower  respiratory specimens during the acute phase of infection. The lowest  concentration of SARS-CoV-2 viral copies this assay can detect is 250  copies / mL. A negative result does not preclude SARS-CoV-2 infection  and should not be used as the sole basis for treatment or other  patient management decisions.  A negative result may  occur with  improper specimen collection / handling, submission of specimen other  than nasopharyngeal swab, presence of viral mutation(s) within the  areas targeted by this assay, and inadequate number of viral copies  (<250  copies / mL). A negative result must be combined with clinical  observations, patient history, and epidemiological information. If result is POSITIVE SARS-CoV-2 target nucleic acids are DETECTED. The SARS-CoV-2 RNA is generally detectable in upper and lower  respiratory specimens dur ing the acute phase of infection.  Positive  results are indicative of active infection with SARS-CoV-2.  Clinical  correlation with patient history and other diagnostic information is  necessary to determine patient infection status.  Positive results do  not rule out bacterial infection or co-infection with other viruses. If result is PRESUMPTIVE POSTIVE SARS-CoV-2 nucleic acids MAY BE PRESENT.   A presumptive positive result was obtained on the submitted specimen  and confirmed on repeat testing.  While 2019 novel coronavirus  (SARS-CoV-2) nucleic acids may be present in the submitted sample  additional confirmatory testing may be necessary for epidemiological  and / or clinical management purposes  to differentiate between  SARS-CoV-2 and other Sarbecovirus currently known to infect humans.  If clinically indicated additional testing with an alternate test  methodology [phone]) is advised. The SARS-CoV-2 RNA is generally  detectable in upper and lower respiratory sp ecimens during the acute  phase of infection. The expected result is Negative. Fact Sheet for Patients:  StrictlyIdeas.no Fact Sheet for Healthcare Providers: BankingDealers.co.za This test is not yet approved or cleared by the [place] FDA and has been authorized for detection and/or diagnosis of SARS-CoV-2 by FDA under an Emergency Use Authorization (EUA).  This  EUA will remain in effect (meaning this test can be used) for the duration of the COVID-19 declaration under Section 564(b)(1) of the Act, 21 U.S.C. section 360bbb-3(b)(1), unless the authorization is terminated or revoked sooner. Performed at [facility], [place] [address] [place],  [address]    Dg Chest 2 View  Result Date: 08/16/2019 CLINICAL DATA:  Left-sided chest pain with shortness of breath for 1 week. EXAM: CHEST - 2 VIEW COMPARISON:  April 06, 2019 FINDINGS: Cardiomediastinal silhouette is normal. Mediastinal contours appear intact. There is no evidence of focal airspace consolidation, pleural effusion or pneumothorax. Osseous structures are without acute abnormality. Stigmata of diffuse idiopathic skeletal hyperostosis of the thoracic spine. Soft tissues are grossly normal. IMPRESSION: No active cardiopulmonary disease. Electronically Signed   By: Fidela Salisbury M.D.   On: 08/16/2019 12:31    Pending Labs Unresulted Labs (From admission, onward)    Start     Ordered   08/18/19 0500  Heparin level (unfractionated)  Daily,   R     08/16/19 1755   08/17/19 0500  CBC with Differential/Platelet  Daily,   R     08/16/19 1800   08/17/19 0500  Lipid panel  Tomorrow morning,   R     08/16/19 1800   08/17/19 0500  Comprehensive metabolic panel  Tomorrow morning,   R     08/16/19 1801   08/17/19 0100  Heparin level (unfractionated)  Once-Timed,   STAT     08/16/19 1755   08/16/19 1809  Urine rapid drug screen (hosp performed)  ONCE - STAT,   STAT     08/16/19 1808   08/16/19 1807  Urinalysis, Routine w reflex microscopic  ONCE - STAT,   STAT     08/16/19 1806   08/16/19 1807  TSH  Once,   STAT     08/16/19 1806   08/16/19 1651  Brain natriuretic peptide  Once,   STAT     08/16/19 1651   08/16/19  1651  Hemoglobin A1c  Once,   STAT     08/16/19 1651   Signed and Held  HIV antibody (Routine Testing)  Once,   R     Signed and Held          Vitals/Pain Today's  Vitals   08/16/19 1745 08/16/19 1830 08/16/19 1845 08/16/19 1900  BP: 109/61 (!) 110/57 (!) 90/55 130/62  Pulse: 74 69 69 92  Resp: $Remo'17 14 18   'CjKnt$ Temp:      TempSrc:      SpO2: 96% 93% 96% 96%  Weight:      Height:      PainSc:        Isolation Precautions No active isolations  Medications Medications  furosemide (LASIX) injection 80 mg (80 mg Intravenous Given 08/16/19 1828)  allopurinol (ZYLOPRIM) tablet 300 mg (has no administration in time range)  insulin glargine (LANTUS) injection 20 Units (has no administration in time range)  isosorbide mononitrate (IMDUR) 24 hr tablet 120 mg (has no administration in time range)  metoprolol succinate (TOPROL-XL) 24 hr tablet 150 mg (has no administration in time range)  nitroGLYCERIN (NITROSTAT) SL tablet 0.4 mg (has no administration in time range)  rosuvastatin (CRESTOR) tablet 40 mg (has no administration in time range)  ticagrelor (BRILINTA) tablet 90 mg (has no administration in time range)  heparin ADULT infusion 100 units/mL (25000 units/214mL sodium chloride 0.45%) (1,400 Units/hr Intravenous Bolus from Bag 08/16/19 1842)  amLODipine (NORVASC) tablet 10 mg (has no administration in time range)  aspirin EC tablet 81 mg (has no administration in time range)  insulin aspart (novoLOG) injection 6 Units (6 Units Subcutaneous Given 08/16/19 1838)  pantoprazole (PROTONIX) EC tablet 40 mg (40 mg Oral Given 08/16/19 1829)  insulin aspart (novoLOG) injection 0-15 Units (has no administration in time range)  sodium chloride flush (NS) 0.9 % injection 3 mL (3 mLs Intravenous Given 08/16/19 1509)  aspirin chewable tablet 243 mg (243 mg Oral Given 08/16/19 1406)  heparin bolus via infusion 4,000 Units (4,000 Units Intravenous Bolus from Bag 08/16/19 1843)    Mobility walks Low fall risk   Focused Assessments Cardiac Assessment Handoff:    Lab Results  Component Value Date   CKTOTAL 620 (H) 03/14/2018   CKMB 8.5 (HH) 07/03/2014   TROPONINI 0.47 ([place])  04/06/2019   No results found for: DDIMER Does the Patient currently have chest pain? No     R Recommendations: See Admitting Provider Note  Report given to: 6E09  Additional Notes:  Pt ambulatory, continent, A/Ox4, verbal-able to make needs known.

## 2019-08-17 DIAGNOSIS — I10 Essential (primary) hypertension: Secondary | ICD-10-CM | POA: Diagnosis not present

## 2019-08-17 DIAGNOSIS — Z9841 Cataract extraction status, right eye: Secondary | ICD-10-CM | POA: Diagnosis not present

## 2019-08-17 DIAGNOSIS — R072 Precordial pain: Secondary | ICD-10-CM | POA: Diagnosis present

## 2019-08-17 DIAGNOSIS — Z961 Presence of intraocular lens: Secondary | ICD-10-CM | POA: Diagnosis present

## 2019-08-17 DIAGNOSIS — Z992 Dependence on renal dialysis: Secondary | ICD-10-CM | POA: Diagnosis not present

## 2019-08-17 DIAGNOSIS — N184 Chronic kidney disease, stage 4 (severe): Secondary | ICD-10-CM

## 2019-08-17 DIAGNOSIS — I2510 Atherosclerotic heart disease of native coronary artery without angina pectoris: Secondary | ICD-10-CM | POA: Diagnosis not present

## 2019-08-17 DIAGNOSIS — I130 Hypertensive heart and chronic kidney disease with heart failure and stage 1 through stage 4 chronic kidney disease, or unspecified chronic kidney disease: Secondary | ICD-10-CM | POA: Diagnosis present

## 2019-08-17 DIAGNOSIS — Z8249 Family history of ischemic heart disease and other diseases of the circulatory system: Secondary | ICD-10-CM | POA: Diagnosis not present

## 2019-08-17 DIAGNOSIS — I5033 Acute on chronic diastolic (congestive) heart failure: Secondary | ICD-10-CM | POA: Diagnosis not present

## 2019-08-17 DIAGNOSIS — I200 Unstable angina: Secondary | ICD-10-CM | POA: Insufficient documentation

## 2019-08-17 DIAGNOSIS — Z9861 Coronary angioplasty status: Secondary | ICD-10-CM | POA: Diagnosis not present

## 2019-08-17 DIAGNOSIS — Z888 Allergy status to other drugs, medicaments and biological substances status: Secondary | ICD-10-CM | POA: Diagnosis not present

## 2019-08-17 DIAGNOSIS — Z9842 Cataract extraction status, left eye: Secondary | ICD-10-CM | POA: Diagnosis not present

## 2019-08-17 DIAGNOSIS — E1142 Type 2 diabetes mellitus with diabetic polyneuropathy: Secondary | ICD-10-CM | POA: Diagnosis present

## 2019-08-17 DIAGNOSIS — Z833 Family history of diabetes mellitus: Secondary | ICD-10-CM | POA: Diagnosis not present

## 2019-08-17 DIAGNOSIS — N179 Acute kidney failure, unspecified: Secondary | ICD-10-CM | POA: Diagnosis present

## 2019-08-17 DIAGNOSIS — K219 Gastro-esophageal reflux disease without esophagitis: Secondary | ICD-10-CM | POA: Diagnosis present

## 2019-08-17 DIAGNOSIS — Y831 Surgical operation with implant of artificial internal device as the cause of abnormal reaction of the patient, or of later complication, without mention of misadventure at the time of the procedure: Secondary | ICD-10-CM | POA: Diagnosis present

## 2019-08-17 DIAGNOSIS — Z20828 Contact with and (suspected) exposure to other viral communicable diseases: Secondary | ICD-10-CM | POA: Diagnosis present

## 2019-08-17 DIAGNOSIS — T82855A Stenosis of coronary artery stent, initial encounter: Secondary | ICD-10-CM | POA: Diagnosis present

## 2019-08-17 DIAGNOSIS — I25110 Atherosclerotic heart disease of native coronary artery with unstable angina pectoris: Secondary | ICD-10-CM | POA: Diagnosis present

## 2019-08-17 DIAGNOSIS — I252 Old myocardial infarction: Secondary | ICD-10-CM | POA: Diagnosis not present

## 2019-08-17 DIAGNOSIS — I5043 Acute on chronic combined systolic (congestive) and diastolic (congestive) heart failure: Secondary | ICD-10-CM | POA: Diagnosis present

## 2019-08-17 DIAGNOSIS — Z6841 Body Mass Index (BMI) 40.0 and over, adult: Secondary | ICD-10-CM | POA: Diagnosis not present

## 2019-08-17 DIAGNOSIS — E785 Hyperlipidemia, unspecified: Secondary | ICD-10-CM | POA: Diagnosis present

## 2019-08-17 DIAGNOSIS — Z89431 Acquired absence of right foot: Secondary | ICD-10-CM | POA: Diagnosis not present

## 2019-08-17 DIAGNOSIS — G4733 Obstructive sleep apnea (adult) (pediatric): Secondary | ICD-10-CM | POA: Diagnosis present

## 2019-08-17 DIAGNOSIS — E1122 Type 2 diabetes mellitus with diabetic chronic kidney disease: Secondary | ICD-10-CM | POA: Diagnosis present

## 2019-08-17 DIAGNOSIS — I255 Ischemic cardiomyopathy: Secondary | ICD-10-CM | POA: Diagnosis present

## 2019-08-17 DIAGNOSIS — N183 Chronic kidney disease, stage 3 (moderate): Secondary | ICD-10-CM | POA: Diagnosis present

## 2019-08-17 LAB — LIPID PANEL
Cholesterol: 168 mg/dL (ref 0–200)
HDL: 85 mg/dL (ref >40)
LDL Cholesterol: 71 mg/dL (ref 0–99)
Total CHOL/HDL Ratio: 2 RATIO
Triglycerides: 60 mg/dL (ref <150)
VLDL: 12 mg/dL (ref 0–40)

## 2019-08-17 LAB — HEPARIN LEVEL (UNFRACTIONATED)
Heparin Unfractionated: 0.6 IU/mL (ref 0.30–0.70)
Heparin Unfractionated: 0.81 IU/mL — ABNORMAL HIGH (ref 0.30–0.70)
Heparin Unfractionated: 0.7 IU/mL (ref 0.30–0.70)

## 2019-08-17 LAB — COMPREHENSIVE METABOLIC PANEL
ALT: UNDETERMINED U/L (ref 0–44)
AST: 21 U/L (ref 15–41)
Albumin: 3.3 g/dL — ABNORMAL LOW (ref 3.5–5.0)
Alkaline Phosphatase: 112 U/L (ref 38–126)
Anion gap: 13 (ref 5–15)
BUN: 46 mg/dL — ABNORMAL HIGH (ref 6–20)
CO2: 19 mmol/L — ABNORMAL LOW (ref 22–32)
Calcium: 8.7 mg/dL — ABNORMAL LOW (ref 8.9–10.3)
Chloride: 106 mmol/L (ref 98–111)
Creatinine, Ser: 2.8 mg/dL — ABNORMAL HIGH (ref 0.61–1.24)
GFR calc Af Amer: 27 mL/min — ABNORMAL LOW (ref >60)
GFR calc non Af Amer: 23 mL/min — ABNORMAL LOW (ref >60)
Glucose, Bld: 215 mg/dL — ABNORMAL HIGH (ref 70–99)
Potassium: 3.6 mmol/L (ref 3.5–5.1)
Sodium: 138 mmol/L (ref 135–145)
Total Bilirubin: UNDETERMINED mg/dL (ref 0.3–1.2)
Total Protein: 7.3 g/dL (ref 6.5–8.1)

## 2019-08-17 LAB — CBC WITH DIFFERENTIAL/PLATELET
Abs Immature Granulocytes: 0.1 10*3/uL — ABNORMAL HIGH (ref 0.00–0.07)
Basophils Absolute: 0 10*3/uL (ref 0.0–0.1)
Basophils Relative: 0 %
Eosinophils Absolute: 0.1 10*3/uL (ref 0.0–0.5)
Eosinophils Relative: 1 %
HCT: 39.2 % (ref 39.0–52.0)
Hemoglobin: 12.6 g/dL — ABNORMAL LOW (ref 13.0–17.0)
Immature Granulocytes: 2 %
Lymphocytes Relative: 26 %
Lymphs Abs: 1.7 10*3/uL (ref 0.7–4.0)
MCH: 27.3 pg (ref 26.0–34.0)
MCHC: 32.1 g/dL (ref 30.0–36.0)
MCV: 84.8 fL (ref 80.0–100.0)
Monocytes Absolute: 0.7 10*3/uL (ref 0.1–1.0)
Monocytes Relative: 11 %
Neutro Abs: 4.1 10*3/uL (ref 1.7–7.7)
Neutrophils Relative %: 60 %
Platelets: 164 10*3/uL (ref 150–400)
RBC: 4.62 MIL/uL (ref 4.22–5.81)
RDW: 15.9 % — ABNORMAL HIGH (ref 11.5–15.5)
WBC: 6.7 10*3/uL (ref 4.0–10.5)
nRBC: 0 % (ref 0.0–0.2)

## 2019-08-17 LAB — BILIRUBIN, TOTAL: Total Bilirubin: 0.5 mg/dL (ref 0.3–1.2)

## 2019-08-17 LAB — HIV ANTIBODY (ROUTINE TESTING W REFLEX): HIV Screen 4th Generation wRfx: NONREACTIVE

## 2019-08-17 LAB — ALT: ALT: 17 U/L (ref 0–44)

## 2019-08-17 LAB — GLUCOSE, CAPILLARY
Glucose-Capillary: 214 mg/dL — ABNORMAL HIGH (ref 70–99)
Glucose-Capillary: 315 mg/dL — ABNORMAL HIGH (ref 70–99)
Glucose-Capillary: 176 mg/dL — ABNORMAL HIGH (ref 70–99)
Glucose-Capillary: 207 mg/dL — ABNORMAL HIGH (ref 70–99)

## 2019-08-17 MED ORDER — PNEUMOCOCCAL VAC POLYVALENT 25 MCG/0.5ML IJ INJ
0.5000 mL | INJECTION | INTRAMUSCULAR | Status: DC
  Filled 2019-08-17: qty 0.5

## 2019-08-17 MED ORDER — INSULIN GLARGINE 100 UNIT/ML ~~LOC~~ SOLN
25.0000 [IU] | Freq: Two times a day (BID) | SUBCUTANEOUS | Status: DC
  Administered 2019-08-17 – 2019-08-18 (×2): 25 [IU] via SUBCUTANEOUS
  Filled 2019-08-17 (×3): qty 0.25

## 2019-08-17 MED ORDER — HEPARIN (PORCINE) 25000 UT/250ML-% IV SOLN
1100.0000 [IU]/h | INTRAVENOUS | Status: DC
  Administered 2019-08-18 – 2019-08-22 (×5): 1100 [IU]/h via INTRAVENOUS
  Filled 2019-08-17 (×5): qty 250

## 2019-08-17 NOTE — Progress Notes (Addendum)
Progress Note  Patient Name: Joshua [name] Date of Encounter: 08/17/2019  Primary Cardiologist: [name], MD   Subjective   Feeling better. No recurrent chest pain. No orthopnea or dyspnea  Inpatient Medications    Scheduled Meds: . allopurinol  300 mg Oral Daily  . amLODipine  10 mg Oral Daily  . aspirin EC  81 mg Oral Daily  . furosemide  80 mg Intravenous Q12H  . insulin aspart  0-15 Units Subcutaneous TID WC  . insulin aspart  6 Units Subcutaneous TID WC  . insulin glargine  20 Units Subcutaneous BID  . isosorbide mononitrate  120 mg Oral BID  . metoprolol succinate  150 mg Oral Daily  . pantoprazole  40 mg Oral BID AC  . [START ON 08/18/2019] pneumococcal 23 valent vaccine  0.5 mL Intramuscular Tomorrow-1000  . rosuvastatin  40 mg Oral Daily  . sodium chloride flush  3 mL Intravenous Q12H  . ticagrelor  90 mg Oral BID   Continuous Infusions: . heparin 1,400 Units/hr (08/16/19 1842)   PRN Meds: acetaminophen **OR** acetaminophen, albuterol, nitroGLYCERIN, ondansetron **OR** ondansetron (ZOFRAN) IV, senna-docusate   Vital Signs    Vitals:   08/16/19 1900 08/16/19 2052 08/17/19 0523 08/17/19 0524  BP: 130/62 127/75 (!) 158/88   Pulse: 92 66 77   Resp:      Temp:  98.2 F (36.8 C) 98.1 F (36.7 C)   TempSrc:  Oral Oral   SpO2: 96% 98% 97%   Weight:  (!) 145.9 kg  (!) 142.6 kg  Height:  $Remove'5\' 9"'MfugCuR$  (1.753 m)  $R'5\' 9"'zb$  (1.753 m)    Intake/Output Summary (Last 24 hours) at 08/17/2019 0816 Last data filed at 08/17/2019 0000 Gross per 24 hour  Intake 222 ml  Output 1625 ml  Net -1403 ml   Last 3 Weights 08/17/2019 08/16/2019 08/16/2019  Weight (lbs) 314 lb 6.4 oz 321 lb 9.6 oz 330 lb  Weight (kg) 142.611 kg 145.877 kg 149.687 kg      Telemetry    NSR - Personally Reviewed  ECG    NSR normal - Personally Reviewed  Physical Exam   GEN: No acute distress.   Neck: No JVD Cardiac: RRR, no murmurs, rubs, or gallops.  Respiratory: Clear to auscultation  bilaterally. GI: Soft, nontender, non-distended  MS: 1+ edema; No deformity. Neuro:  Nonfocal  Psych: Normal affect   Labs    High Sensitivity Troponin:   Recent Labs  Lab 08/16/19 1158 08/16/19 1411  TROPONINIHS 12 14      Chemistry Recent Labs  Lab 08/16/19 1158 08/17/19 0147 08/17/19 0402  NA 138 138  --   K 3.5 3.6  --   CL 103 106  --   CO2 20* 19*  --   GLUCOSE 264* 215*  --   BUN 43* 46*  --   CREATININE 3.03* 2.80*  --   CALCIUM 8.9 8.7*  --   PROT  --  7.3  --   ALBUMIN  --  3.3*  --   AST  --  21  --   ALT  --  QUANTITY NOT SUFFICIENT, UNABLE TO PERFORM TEST 17  ALKPHOS  --  112  --   BILITOT  --  QUANTITY NOT SUFFICIENT, UNABLE TO PERFORM TEST 0.5  GFRNONAA 21* 23*  --   GFRAA 25* 27*  --   ANIONGAP 15 13  --      Hematology Recent Labs  Lab 08/16/19 1158 08/17/19 0147  WBC 6.8 6.7  RBC 4.59 4.62  HGB 12.5* 12.6*  HCT 40.3 39.2  MCV 87.8 84.8  MCH 27.2 27.3  MCHC 31.0 32.1  RDW 16.1* 15.9*  PLT 184 164    BNP Recent Labs  Lab 08/16/19 2133  BNP 81.1     DDimer No results for input(s): DDIMER in the last 168 hours.   Radiology    Dg Chest 2 View  Result Date: 08/16/2019 CLINICAL DATA:  Left-sided chest pain with shortness of breath for 1 week. EXAM: CHEST - 2 VIEW COMPARISON:  April 06, 2019 FINDINGS: Cardiomediastinal silhouette is normal. Mediastinal contours appear intact. There is no evidence of focal airspace consolidation, pleural effusion or pneumothorax. Osseous structures are without acute abnormality. Stigmata of diffuse idiopathic skeletal hyperostosis of the thoracic spine. Soft tissues are grossly normal. IMPRESSION: No active cardiopulmonary disease. Electronically Signed   By: Fidela Salisbury M.D.   On: 08/16/2019 12:31    Cardiac Studies   Echo 04/07/2019 IMPRESSIONS  1. The left ventricle has normal systolic function, with an ejection fraction of 55-60%. The cavity size was normal. Indeterminate diastolic  filling due to E-A fusion. 2. LV regional wall motion abnormalities could not be completely assessed due to suboptimal acoustic windows. Grossly normal wall motion. 3. The inferior vena cava was dilated in size with <50% respiratory variability.  CORONARY STENT INTERVENTION4/27/2020  LEFT HEART CATH AND CORONARY ANGIOGRAPHY  Conclusion     Previously placed Mid LAD drug eluting stent is widely patent.  Ost 1st Diag to 1st Diag lesion is 40% stenosed.  Dist LAD lesion is 10% stenosed.  Previously placed 2nd Mrg drug eluting stent is widely patent.  3rd Mrg lesion is 30% stenosed.  Mid Cx lesion is 50% stenosed.  RPDA lesion is 30% stenosed.  Prox RCA to Mid RCA lesion is 40% stenosed.  Mid RCA lesion is 25% stenosed.  LV end diastolic pressure is normal.  There is no aortic valve stenosis.  Ost LAD to Prox LAD lesion is 90% stenosed. In stent restenosis.  Scoring balloon angioplasty was performed using a BALLOON [place] 3.75X10, followed by 4.0 [place] balloon.  Prox LAD lesion is 90% stenosed.  Post intervention, there is a 0% residual stenosis.     Patient Profile     63 y.o. male with a hx of long-standing CAD with multiple PCIs, intermittent medication noncompliance, HTN, DM2, obesity, HDL, CKD, Sleep apnea, morbid obesity who is being seen  for the evaluation of chest pain  Assessment & Plan    1. Unstable angina. Symptoms similar to prior cardiac symptoms in April. At high risk for restenosis especially of the proximal LAD. I feel he will need repeat cardiac cath but this will have to wait for improvement in renal function closer to baseline. Continue home metoprolol, amlodipine, metoprolol. IV heparin. Continue DAPT. If he has frequent angina may need to add IV Ntg. Plan cardiac cath on Tuesdau if renal function close to baseline.  2. Acute on chronic diastolic CHF. Appears to still be volume overloaded. Good diuresis overnight I/O negative 1.6 liters.  Weight is coming down but still 10 lbs up since April.  BNP 81. ( may be falsely low with obesity). Continue IV diuresis. Monitor I/O, daily weight. Sodium restriction. Monitor renal function closely. Anticipate holding IV lasix on Sunday/Monday to prepare for cardiac cath.  3. Acute on chronic renal failure. Baseline creatinine around 2. Up to 3 on admission. 2.8 today. Will monitor with IV diuresis. Uncontrolled  DM may be playing a role. If renal function worsens will need to get Nephrology involved.  4. DM poorly controlled. A1c > 15 in April. Now 10.  Primary team to manage.  5. HLD. On high dose statin. Excellent control  6. HTN. Continue home meds. Monitor with diuresis.       For questions or updates, please contact [place] Please consult [email or web address] for contact info under        Signed, Jonty Morrical [place], MD  08/17/2019, 8:16 AM

## 2019-08-17 NOTE — Progress Notes (Signed)
[place] for Heparin Indication: chest pain/ACS  Allergies  Allergen Reactions  . Chlorthalidone Other (See Comments)    Causes dehydration, kidneys shut down    Patient Measurements: Height: 5\' 9"  (175.3 cm) Weight: (!) 314 lb 6.4 oz (142.6 kg) IBW/kg (Calculated) : 70.7 Heparin Dosing Weight: 106.8 kg   Vital Signs: Temp: 98.1 F (36.7 C) (09/04 0523) Temp Source: Oral (09/04 0523) BP: 158/88 (09/04 0523) Pulse Rate: 77 (09/04 0523)  Labs: Recent Labs    08/16/19 1158 08/16/19 1411 08/17/19 0147 08/17/19 1028  HGB 12.5*  --  12.6*  --   HCT 40.3  --  39.2  --   PLT 184  --  164  --   HEPARINUNFRC  --   --  0.60 0.81*  CREATININE 3.03*  --  2.80*  --   TROPONINIHS 12 14  --   --     Estimated Creatinine Clearance: 39.5 mL/min (A) (by C-G formula based on SCr of 2.8 mg/dL (H)).    Assessment: 63 y.o. male presenting with intermittent chest pain for the last 7-10 days and increased lower leg edema. PMH includes long-standing CAD with multiple PCIs, HTN, DM2, obesity, HDL, and CKD. No anticoagulation PTA. No reports of bleeding noted. Pharmacy consulted to dose heparin.  Hgb 12.5, Plts 184. Weight per patient 330 lbs. Confirmatory heparin level 0.81 units/ml  Goal of Therapy:  Heparin level 0.3-0.7 units/ml Monitor platelets by anticoagulation protocol: Yes   Plan:  Decrease heparin gtt to 1200 units/hr  Heparin level in 6 hours  Jamika Sadek A. Levada Dy, PharmD, BCPS, FNKF Clinical Pharmacist [place] Please utilize Amion for appropriate phone number to reach the unit pharmacist ([place])

## 2019-08-17 NOTE — Progress Notes (Signed)
ANTICOAGULATION CONSULT NOTE:  Follow Up  Pharmacy Consult for Heparin Indication: chest pain/ACS  Allergies  Allergen Reactions  . Chlorthalidone Other (See Comments)    Causes dehydration, kidneys shut down    Patient Measurements: Height: 5\' 9"  (175.3 cm) Weight: (!) 314 lb 6.4 oz (142.6 kg) IBW/kg (Calculated) : 70.7 Heparin Dosing Weight: 106.8 kg   Vital Signs: Temp: 98.1 F (36.7 C) (09/04 1528) Temp Source: Oral (09/04 1528) BP: 122/70 (09/04 1528) Pulse Rate: 65 (09/04 1528)  Labs: Recent Labs    08/16/19 1158 08/16/19 1411 08/17/19 0147 08/17/19 1028 08/17/19 1839  HGB 12.5*  --  12.6*  --   --   HCT 40.3  --  39.2  --   --   PLT 184  --  164  --   --   HEPARINUNFRC  --   --  0.60 0.81* 0.70  CREATININE 3.03*  --  2.80*  --   --   TROPONINIHS 12 14  --   --   --     Estimated Creatinine Clearance: 39.5 mL/min (A) (by C-G formula based on SCr of 2.8 mg/dL (H)).   Assessment: 63 yr old male presenting with intermittent chest pain for the last 7-10 days and increased lower leg edema. PMH includes long-standing CAD with multiple PCIs, HTN, DM2, obesity, HDL, and CKD. No anticoagulation PTA. No reports of bleeding noted. Pharmacy consulted to dose heparin.  H/H, platelets stable.  Heparin level earlier today (9/4) was 0.81 units/mL; heparin infusion decreased to 1200 units/hr. Heparin level this afternoon, ~6 hrs after heparin rate decrease to 1200 units/hr, was 0.70 units/mL. Per RN, no issues with IV or signs/sx of bleeding observed.  Goal of Therapy:  Heparin level 0.3-0.7 units/ml Monitor platelets by anticoagulation protocol: Yes   Plan:  As heparin level on 1200 units/hr is at the upper end of the goal range, will decrease heparin infusion to 1100 units/hr Heparin level in 6 hours and daily Monitor CBC daily Monitor for signs/sx of bleeding  Gillermina Hu, PharmD, BCPS, [facility] Clinical Pharmacist

## 2019-08-17 NOTE — Progress Notes (Signed)
[place] for Heparin Indication: chest pain/ACS  Allergies  Allergen Reactions  . Chlorthalidone Other (See Comments)    Causes dehydration, kidneys shut down    Patient Measurements: Height: 5\' 9"  (175.3 cm) Weight: (!) 330 lb (149.7 kg) IBW/kg (Calculated) : 70.7 Heparin Dosing Weight: 106.8 kg   Vital Signs: Temp: 98.2 F (36.8 C) (09/03 2052) Temp Source: Oral (09/03 2052) BP: 127/75 (09/03 2052) Pulse Rate: 66 (09/03 2052)  Labs: Recent Labs    08/16/19 1158 08/16/19 1411 08/17/19 0147  HGB 12.5*  --  12.6*  HCT 40.3  --  39.2  PLT 184  --  164  HEPARINUNFRC  --   --  0.60  CREATININE 3.03*  --  2.80*  TROPONINIHS 12 14  --     Estimated Creatinine Clearance: 40.6 mL/min (A) (by C-G formula based on SCr of 2.8 mg/dL (H)).    Assessment: 63 y.o. male presenting with intermittent chest pain for the last 7-10 days and increased lower leg edema. PMH includes long-standing CAD with multiple PCIs, HTN, DM2, obesity, HDL, and CKD. No anticoagulation PTA. No reports of bleeding noted. Pharmacy consulted to dose heparin.  Hgb 12.5, Plts 184. Weight per patient 330 lbs. Initial heparin level 0.60 units/ml  Goal of Therapy:  Heparin level 0.3-0.7 units/ml Monitor platelets by anticoagulation protocol: Yes   Plan:  Continue heparin gtt 1400 units/hr  Check heparin level later today to confirm  Thanks for allowing pharmacy to be a part of this patient's care.  Excell Seltzer, PharmD Clinical Pharmacist

## 2019-08-17 NOTE — Progress Notes (Addendum)
Inpatient Diabetes Program Recommendations  AACE/ADA: New Consensus Statement on Inpatient Glycemic Control   Target Ranges:  Prepandial:   less than 140 mg/dL      Peak postprandial:   less than 180 mg/dL (1-2 hours)      Critically ill patients:  140 - 180 mg/dL  Results for Joshua [name], Joshua [name] (MRN [id number]) as of 08/17/2019 11:32  Ref. Range 08/16/2019 20:50 08/17/2019 07:53 08/17/2019 10:58  Glucose-Capillary Latest Ref Range: 70 - 99 mg/dL 162 (H) 214 (H) 315 (H)   Results for Joshua [name], Joshua [name] (MRN [id number]) as of 08/17/2019 11:32  Ref. Range 03/15/2018 00:45 04/07/2019 15:41 08/16/2019 21:33  Hemoglobin A1C Latest Ref Range: 4.8 - 5.6 % >15.5 (H) 15.0 (H) 10.2 (H)   Review of Glycemic Control  Diabetes history: DM2 Outpatient Diabetes medications: Lantus 20 units BID, Novolog 10-12 units BID Current orders for Inpatient glycemic control: Lantus 20 units BID, Novolog 0-15 units TID with meals, Novolog 6 units TID with meals for meal coverage  Inpatient Diabetes Program Recommendations:   Insulin-Correction: Please consider ordering Novolog 0-5 units QHS.  Insulin-Basal: If glucose is consistently greater than 180 mg/dl once second dose of Lantus is given, please consider increasing Lantus to 24 units BID.  A1C: A1C 10.2% on 08/16/19 indicating an average glucose of 246 mg/dl over the past 2-3 months. Prior A1C was 15% on 04/07/19 and inpatient diabetes coordinator spoke with patient on 04/08/19 during prior hospital admission. Current A1C improve from A1C in April but still above goal of 7% or less.  Thanks, Barnie Alderman, RN, MSN, CDE Diabetes Coordinator Inpatient Diabetes Program [phone] (Team Pager from 8am to 5pm)

## 2019-08-17 NOTE — Progress Notes (Addendum)
PROGRESS NOTE    Joshua [name]  BJY:[id number] DOB: May 26, 1956 DOA: 08/16/2019 PCP: [name], MD  Brief Narrative: Joshua [name] is a 63 y.o. male with medical history significant for extensive cardiac history, CAD with multiple PCI's, diabetes mellitus type 2, hypertension, hyperlipidemia, CKD, sleep apnea, morbid obesity, presents to the ED complaining of chest pain.  Patient reports intermittent left-sided chest pain for the past 10 days worsening over the past 2 days, describes it as pressure, nonradiating, 7/10 at its worse, minimally relieved with nitroglycerin with associated chronic shortness of breath, lower extremity edema and weight gain of about 25 pounds in the last couple of months.    Last catheterization was done in April for NSTEMI, which showed stent restenosis in the proximal LAD treated with cutting balloon PCI. Admitted with unstable angina and acute on chronic diastolic CHF  Assessment & Plan:   Unstable angina with extensive CAD hx s/p multiple stents -extensive cardiac history -Last cath-03/2019 showed Ost LAD to Prox LAD lesion 90% stenosed in-stent restenosis s/p balloon angioplasty -Cardiology following, continue IV heparin, aspirin, brillinta, metoprolol, crestor -plan for cath once renal fxn improves  Acute on chronic diastolic HF -Last ECHO on 03/2019- EF of 55-60% -continue Iv lasix $Remove'80mg'Ecmcqse$  q12 -monitor creatinine closely  AKI on CKD stage 3 Baseline Cr around 2, currently elevated above baseline -possibly cardiorenal -now 2.8 from 3 yesterday, monitor with diuresis  Uncontrolled Diabetes mellitus type 2 with hyperglycemia Last A1c was 15, repeat 10.5 -CBGs uncontrolled, continue lantus, increase dose, SSI  HTN Stable Continue home regimen with metoprolol, amlodipine, imdur  HLD Continue crestor  GERD Continue PPI BID  Gout Stable Continue Allopurinol  OSA Continue cpap  Morbid obesity Lifestyle modification advised   DVT  prophylaxis: Heparin drip  Code Status: Full Family Communication: None at bedside Disposition Plan: Likely home once work up complete   Consults : Cardiology     Procedures:   Antimicrobials:    Subjective: -breathing improving, not back to baseline -denies chest pain this morning  Objective: Vitals:   08/16/19 1900 08/16/19 2052 08/17/19 0523 08/17/19 0524  BP: 130/62 127/75 (!) 158/88   Pulse: 92 66 77   Resp:      Temp:  98.2 F (36.8 C) 98.1 F (36.7 C)   TempSrc:  Oral Oral   SpO2: 96% 98% 97%   Weight:  (!) 145.9 kg  (!) 142.6 kg  Height:  $Remove'5\' 9"'pzjpiKe$  (1.753 m)  $R'5\' 9"'ub$  (1.753 m)    Intake/Output Summary (Last 24 hours) at 08/17/2019 1339 Last data filed at 08/17/2019 1151 Gross per 24 hour  Intake 222 ml  Output 3900 ml  Net -3678 ml   Filed Weights   08/16/19 1715 08/16/19 2052 08/17/19 0524  Weight: (!) 149.7 kg (!) 145.9 kg (!) 142.6 kg    Examination:  General exam: obese male, AAOx3 Respiratory system: basilar rales Cardiovascular system: S1 & S2 heard, RRR.  Gastrointestinal system: Abdomen is nondistended, soft and nontender.Normal bowel sounds heard. Central nervous system: Alert and oriented. No focal neurological deficits. Extremities: 1plus edema Skin: No rashes, lesions or ulcers Psychiatry: Judgement and insight appear normal. Mood & affect appropriate.     Data Reviewed:   CBC: Recent Labs  Lab 08/16/19 1158 08/17/19 0147  WBC 6.8 6.7  NEUTROABS  --  4.1  HGB 12.5* 12.6*  HCT 40.3 39.2  MCV 87.8 84.8  PLT 184 086   Basic Metabolic Panel: Recent Labs  Lab 08/16/19 1158 08/17/19  0147  NA 138 138  K 3.5 3.6  CL 103 106  CO2 20* 19*  GLUCOSE 264* 215*  BUN 43* 46*  CREATININE 3.03* 2.80*  CALCIUM 8.9 8.7*   GFR: Estimated Creatinine Clearance: 39.5 mL/min (A) (by C-G formula based on SCr of 2.8 mg/dL (H)). Liver Function Tests: Recent Labs  Lab 08/17/19 0147 08/17/19 0402  AST 21  --   ALT QUANTITY NOT  SUFFICIENT, UNABLE TO PERFORM TEST 17  ALKPHOS 112  --   BILITOT QUANTITY NOT SUFFICIENT, UNABLE TO PERFORM TEST 0.5  PROT 7.3  --   ALBUMIN 3.3*  --    No results for input(s): LIPASE, AMYLASE in the last 168 hours. No results for input(s): AMMONIA in the last 168 hours. Coagulation Profile: No results for input(s): INR, PROTIME in the last 168 hours. Cardiac Enzymes: No results for input(s): CKTOTAL, CKMB, CKMBINDEX, TROPONINI in the last 168 hours. BNP (last 3 results) No results for input(s): PROBNP in the last 8760 hours. HbA1C: Recent Labs    08/16/19 2133  HGBA1C 10.2*   CBG: Recent Labs  Lab 08/16/19 2050 08/17/19 0753 08/17/19 1058  GLUCAP 162* 214* 315*   Lipid Profile: Recent Labs    08/17/19 0402  CHOL 168  HDL 85  LDLCALC 71  TRIG 60  CHOLHDL 2.0   Thyroid Function Tests: Recent Labs    08/16/19 2133  TSH 1.675   Anemia Panel: No results for input(s): VITAMINB12, FOLATE, FERRITIN, TIBC, IRON, RETICCTPCT in the last 72 hours. Urine analysis:    Component Value Date/Time   COLORURINE STRAW (A) 08/16/2019 2120   APPEARANCEUR CLEAR 08/16/2019 2120   LABSPEC 1.005 08/16/2019 2120   PHURINE 5.0 08/16/2019 2120   GLUCOSEU NEGATIVE 08/16/2019 2120   HGBUR SMALL (A) 08/16/2019 2120   BILIRUBINUR NEGATIVE 08/16/2019 2120   KETONESUR NEGATIVE 08/16/2019 2120   PROTEINUR 100 (A) 08/16/2019 2120   UROBILINOGEN 0.2 11/01/2011 1410   NITRITE NEGATIVE 08/16/2019 2120   LEUKOCYTESUR NEGATIVE 08/16/2019 2120   Sepsis Labs: $RemoveBefo'@LABRCNTIP'YKrkgcIDwHi$ (procalcitonin:4,lacticidven:4)  ) Recent Results (from the past 240 hour(s))  SARS Coronavirus 2 [facility] order, Performed in [facility] hospital lab) Nasopharyngeal Nasopharyngeal Swab     Status: None   Collection Time: 08/16/19  2:50 PM   Specimen: Nasopharyngeal Swab  Result Value Ref Range Status   SARS Coronavirus 2 NEGATIVE NEGATIVE Final    Comment: (NOTE) If result is NEGATIVE SARS-CoV-2 target nucleic acids  are NOT DETECTED. The SARS-CoV-2 RNA is generally detectable in upper and lower  respiratory specimens during the acute phase of infection. The lowest  concentration of SARS-CoV-2 viral copies this assay can detect is 250  copies / mL. A negative result does not preclude SARS-CoV-2 infection  and should not be used as the sole basis for treatment or other  patient management decisions.  A negative result may occur with  improper specimen collection / handling, submission of specimen other  than nasopharyngeal swab, presence of viral mutation(s) within the  areas targeted by this assay, and inadequate number of viral copies  (<250 copies / mL). A negative result must be combined with clinical  observations, patient history, and epidemiological information. If result is POSITIVE SARS-CoV-2 target nucleic acids are DETECTED. The SARS-CoV-2 RNA is generally detectable in upper and lower  respiratory specimens dur ing the acute phase of infection.  Positive  results are indicative of active infection with SARS-CoV-2.  Clinical  correlation with patient history and other diagnostic information is  necessary to determine patient infection status.  Positive results do  not rule out bacterial infection or co-infection with other viruses. If result is PRESUMPTIVE POSTIVE SARS-CoV-2 nucleic acids MAY BE PRESENT.   A presumptive positive result was obtained on the submitted specimen  and confirmed on repeat testing.  While 2019 novel coronavirus  (SARS-CoV-2) nucleic acids may be present in the submitted sample  additional confirmatory testing may be necessary for epidemiological  and / or clinical management purposes  to differentiate between  SARS-CoV-2 and other Sarbecovirus currently known to infect humans.  If clinically indicated additional testing with an alternate test  methodology [phone]) is advised. The SARS-CoV-2 RNA is generally  detectable in upper and lower respiratory sp ecimens  during the acute  phase of infection. The expected result is Negative. Fact Sheet for Patients:  StrictlyIdeas.no Fact Sheet for Healthcare Providers: BankingDealers.co.za This test is not yet approved or cleared by the [place] FDA and has been authorized for detection and/or diagnosis of SARS-CoV-2 by FDA under an Emergency Use Authorization (EUA).  This EUA will remain in effect (meaning this test can be used) for the duration of the COVID-19 declaration under Section 564(b)(1) of the Act, 21 U.S.C. section 360bbb-3(b)(1), unless the authorization is terminated or revoked sooner. Performed at [facility], [place] [address] [place], [place] [address]          Radiology Studies: Dg Chest 2 View  Result Date: 08/16/2019 CLINICAL DATA:  Left-sided chest pain with shortness of breath for 1 week. EXAM: CHEST - 2 VIEW COMPARISON:  April 06, 2019 FINDINGS: Cardiomediastinal silhouette is normal. Mediastinal contours appear intact. There is no evidence of focal airspace consolidation, pleural effusion or pneumothorax. Osseous structures are without acute abnormality. Stigmata of diffuse idiopathic skeletal hyperostosis of the thoracic spine. Soft tissues are grossly normal. IMPRESSION: No active cardiopulmonary disease. Electronically Signed   By: Fidela Salisbury M.D.   On: 08/16/2019 12:31        Scheduled Meds: . allopurinol  300 mg Oral Daily  . amLODipine  10 mg Oral Daily  . aspirin EC  81 mg Oral Daily  . furosemide  80 mg Intravenous Q12H  . insulin aspart  0-15 Units Subcutaneous TID WC  . insulin aspart  6 Units Subcutaneous TID WC  . insulin glargine  20 Units Subcutaneous BID  . isosorbide mononitrate  120 mg Oral BID  . metoprolol succinate  150 mg Oral Daily  . pantoprazole  40 mg Oral BID AC  . [START ON 08/18/2019] pneumococcal 23 valent vaccine  0.5 mL Intramuscular Tomorrow-1000  . rosuvastatin  40 mg Oral  Daily  . sodium chloride flush  3 mL Intravenous Q12H  . ticagrelor  90 mg Oral BID   Continuous Infusions: . heparin 1,200 Units/hr (08/17/19 1246)     LOS: 0 days    Time spent: 69min    Domenic Polite, MD [facility]  08/17/2019, 1:39 PM

## 2019-08-18 LAB — CBC WITH DIFFERENTIAL/PLATELET
Abs Immature Granulocytes: 0.05 10*3/uL (ref 0.00–0.07)
Basophils Absolute: 0 10*3/uL (ref 0.0–0.1)
Basophils Relative: 0 %
Eosinophils Absolute: 0.1 10*3/uL (ref 0.0–0.5)
Eosinophils Relative: 1 %
HCT: 38.2 % — ABNORMAL LOW (ref 39.0–52.0)
Hemoglobin: 12.5 g/dL — ABNORMAL LOW (ref 13.0–17.0)
Immature Granulocytes: 1 %
Lymphocytes Relative: 24 %
Lymphs Abs: 1.6 10*3/uL (ref 0.7–4.0)
MCH: 27.8 pg (ref 26.0–34.0)
MCHC: 32.7 g/dL (ref 30.0–36.0)
MCV: 84.9 fL (ref 80.0–100.0)
Monocytes Absolute: 0.7 10*3/uL (ref 0.1–1.0)
Monocytes Relative: 10 %
Neutro Abs: 4.3 10*3/uL (ref 1.7–7.7)
Neutrophils Relative %: 64 %
Platelets: 170 10*3/uL (ref 150–400)
RBC: 4.5 MIL/uL (ref 4.22–5.81)
RDW: 16 % — ABNORMAL HIGH (ref 11.5–15.5)
WBC: 6.7 10*3/uL (ref 4.0–10.5)
nRBC: 0 % (ref 0.0–0.2)

## 2019-08-18 LAB — GLUCOSE, CAPILLARY
Glucose-Capillary: 211 mg/dL — ABNORMAL HIGH (ref 70–99)
Glucose-Capillary: 253 mg/dL — ABNORMAL HIGH (ref 70–99)
Glucose-Capillary: 93 mg/dL (ref 70–99)
Glucose-Capillary: 138 mg/dL — ABNORMAL HIGH (ref 70–99)

## 2019-08-18 LAB — BASIC METABOLIC PANEL
Anion gap: 10 (ref 5–15)
BUN: 44 mg/dL — ABNORMAL HIGH (ref 6–20)
CO2: 23 mmol/L (ref 22–32)
Calcium: 8.9 mg/dL (ref 8.9–10.3)
Chloride: 104 mmol/L (ref 98–111)
Creatinine, Ser: 2.68 mg/dL — ABNORMAL HIGH (ref 0.61–1.24)
GFR calc Af Amer: 29 mL/min — ABNORMAL LOW (ref >60)
GFR calc non Af Amer: 25 mL/min — ABNORMAL LOW (ref >60)
Glucose, Bld: 211 mg/dL — ABNORMAL HIGH (ref 70–99)
Potassium: 3.2 mmol/L — ABNORMAL LOW (ref 3.5–5.1)
Sodium: 137 mmol/L (ref 135–145)

## 2019-08-18 LAB — HEPARIN LEVEL (UNFRACTIONATED): Heparin Unfractionated: 0.48 IU/mL (ref 0.30–0.70)

## 2019-08-18 MED ORDER — POTASSIUM CHLORIDE CRYS ER 20 MEQ PO TBCR
40.0000 meq | EXTENDED_RELEASE_TABLET | Freq: Once | ORAL | Status: AC
  Administered 2019-08-18: 40 meq via ORAL
  Filled 2019-08-18: qty 2

## 2019-08-18 MED ORDER — INSULIN GLARGINE 100 UNIT/ML ~~LOC~~ SOLN
30.0000 [IU] | Freq: Two times a day (BID) | SUBCUTANEOUS | Status: DC
  Administered 2019-08-18 – 2019-08-19 (×3): 30 [IU] via SUBCUTANEOUS
  Filled 2019-08-18 (×5): qty 0.3

## 2019-08-18 NOTE — Progress Notes (Signed)
PROGRESS NOTE    Joshua [name]  SNK:[id number] DOB: Mar 17, 1956 DOA: 08/16/2019 PCP: [name], MD  Brief Narrative: Joshua [name] is a 63 y.o. male with medical history significant for extensive cardiac history, CAD with multiple PCI's, diabetes mellitus type 2, hypertension, hyperlipidemia, CKD, sleep apnea, morbid obesity, presents to the ED complaining of chest pain.  Patient reports intermittent left-sided chest pain for the past 10 days worsening over the past 2 days, describes it as pressure, nonradiating, 7/10 at its worse, minimally relieved with nitroglycerin with associated chronic shortness of breath, lower extremity edema and weight gain of about 25 pounds in the last couple of months.    Last catheterization was done in April for NSTEMI, which showed stent restenosis in the proximal LAD treated with cutting balloon PCI. Admitted with unstable angina and acute on chronic diastolic CHF  Assessment & Plan:   Unstable angina with extensive CAD hx s/p multiple stents -extensive cardiac history -Last cath-03/2019 showed Ost LAD to Prox LAD lesion 90% stenosed in-stent restenosis s/p balloon angioplasty -Cardiology following, continue IV heparin, aspirin, brillinta, metoprolol, crestor -Plan for cath on Tuesday if renal function improves.  Acute on chronic diastolic HF -Last ECHO on 03/2019- EF of 55-60% -Continue Lasix IV 80 mg every 12.  Not hypoxic.  AKI on CKD stage 3 Baseline Cr around 2, but came in with 3.03 which has now improved and is down to only 2.68.  Continue current management.    Hypokalemia: 3.2.  Replace orally and recheck in the morning.  Uncontrolled Diabetes mellitus type 2 with hyperglycemia Takes 20 units of Lantus along with sliding scale insulin at home.  He is on 25 units twice daily here and interestingly his sugars are still elevated.  Will increase to 30 units twice daily and continue SSI.  HTN Stable Continue home regimen with metoprolol,  amlodipine, imdur  HLD Continue crestor  GERD Continue PPI BID  Gout Stable Continue Allopurinol  OSA Continue cpap  Morbid obesity Counseling provided.  DVT prophylaxis: Heparin drip  Code Status: Full Family Communication: None at bedside Disposition Plan: Likely home once work up complete   Consults : Cardiology     Procedures:   Antimicrobials:    Subjective: Patient seen and examined.  Feels much better now with no chest pain or shortness of breath however he did have some chest pressure briefly between 2:30 and 3 AM this morning.  Objective: Vitals:   08/17/19 2019 08/17/19 2055 08/18/19 0547 08/18/19 0853  BP:  134/74 133/78 133/73  Pulse: 66 61 66   Resp: $Remo'18 18 18   'CQOBi$ Temp:  97.9 F (36.6 C) 98.1 F (36.7 C)   TempSrc:  Oral Oral   SpO2: 99% 100% 98%   Weight:   (!) 140.6 kg   Height:        Intake/Output Summary (Last 24 hours) at 08/18/2019 1229 Last data filed at 08/18/2019 1100 Gross per 24 hour  Intake 1193.18 ml  Output 2950 ml  Net -1756.82 ml   Filed Weights   08/16/19 2052 08/17/19 0524 08/18/19 0547  Weight: (!) 145.9 kg (!) 142.6 kg (!) 140.6 kg    Examination:  General exam: Appears calm and comfortable, morbidly obese Respiratory system: Clear to auscultation. Respiratory effort normal. Cardiovascular system: S1 & S2 heard, RRR. No JVD, murmurs, rubs, gallops or clicks. No pedal edema. Gastrointestinal system: Abdomen is nondistended, soft and nontender. No organomegaly or masses felt. Normal bowel sounds heard. Central nervous system: Alert  and oriented. No focal neurological deficits. Extremities: Symmetric 5 x 5 power. Skin: No rashes, lesions or ulcers.  Psychiatry: Judgement and insight appear normal. Mood & affect appropriate.   Data Reviewed:   CBC: Recent Labs  Lab 08/16/19 1158 08/17/19 0147 08/18/19 0221  WBC 6.8 6.7 6.7  NEUTROABS  --  4.1 4.3  HGB 12.5* 12.6* 12.5*  HCT 40.3 39.2 38.2*  MCV 87.8  84.8 84.9  PLT 184 164 885   Basic Metabolic Panel: Recent Labs  Lab 08/16/19 1158 08/17/19 0147 08/18/19 0221  NA 138 138 137  K 3.5 3.6 3.2*  CL 103 106 104  CO2 20* 19* 23  GLUCOSE 264* 215* 211*  BUN 43* 46* 44*  CREATININE 3.03* 2.80* 2.68*  CALCIUM 8.9 8.7* 8.9   GFR: Estimated Creatinine Clearance: 40.9 mL/min (A) (by C-G formula based on SCr of 2.68 mg/dL (H)). Liver Function Tests: Recent Labs  Lab 08/17/19 0147 08/17/19 0402  AST 21  --   ALT QUANTITY NOT SUFFICIENT, UNABLE TO PERFORM TEST 17  ALKPHOS 112  --   BILITOT QUANTITY NOT SUFFICIENT, UNABLE TO PERFORM TEST 0.5  PROT 7.3  --   ALBUMIN 3.3*  --    No results for input(s): LIPASE, AMYLASE in the last 168 hours. No results for input(s): AMMONIA in the last 168 hours. Coagulation Profile: No results for input(s): INR, PROTIME in the last 168 hours. Cardiac Enzymes: No results for input(s): CKTOTAL, CKMB, CKMBINDEX, TROPONINI in the last 168 hours. BNP (last 3 results) No results for input(s): PROBNP in the last 8760 hours. HbA1C: Recent Labs    08/16/19 2133  HGBA1C 10.2*   CBG: Recent Labs  Lab 08/17/19 1058 08/17/19 1655 08/17/19 2051 08/18/19 0730 08/18/19 1127  GLUCAP 315* 176* 207* 211* 253*   Lipid Profile: Recent Labs    08/17/19 0402  CHOL 168  HDL 85  LDLCALC 71  TRIG 60  CHOLHDL 2.0   Thyroid Function Tests: Recent Labs    08/16/19 2133  TSH 1.675   Anemia Panel: No results for input(s): VITAMINB12, FOLATE, FERRITIN, TIBC, IRON, RETICCTPCT in the last 72 hours. Urine analysis:    Component Value Date/Time   COLORURINE STRAW (A) 08/16/2019 2120   APPEARANCEUR CLEAR 08/16/2019 2120   LABSPEC 1.005 08/16/2019 2120   PHURINE 5.0 08/16/2019 2120   GLUCOSEU NEGATIVE 08/16/2019 2120   HGBUR SMALL (A) 08/16/2019 2120   BILIRUBINUR NEGATIVE 08/16/2019 2120   KETONESUR NEGATIVE 08/16/2019 2120   PROTEINUR 100 (A) 08/16/2019 2120   UROBILINOGEN 0.2 11/01/2011 1410    NITRITE NEGATIVE 08/16/2019 2120   LEUKOCYTESUR NEGATIVE 08/16/2019 2120   Sepsis Labs: $RemoveBefo'[email or web address]'[id number]$ (procalcitonin:4,lacticidven:4)  ) Recent Results (from the past 240 hour(s))  SARS Coronavirus 2 [facility] order, Performed in [facility] hospital lab) Nasopharyngeal Nasopharyngeal Swab     Status: None   Collection Time: 08/16/19  2:50 PM   Specimen: Nasopharyngeal Swab  Result Value Ref Range Status   SARS Coronavirus 2 NEGATIVE NEGATIVE Final    Comment: (NOTE) If result is NEGATIVE SARS-CoV-2 target nucleic acids are NOT DETECTED. The SARS-CoV-2 RNA is generally detectable in upper and lower  respiratory specimens during the acute phase of infection. The lowest  concentration of SARS-CoV-2 viral copies this assay can detect is 250  copies / mL. A negative result does not preclude SARS-CoV-2 infection  and should not be used as the sole basis for treatment or other  patient management decisions.  A negative result may occur with  improper specimen collection / handling, submission of specimen other  than nasopharyngeal swab, presence of viral mutation(s) within the  areas targeted by this assay, and inadequate number of viral copies  (<250 copies / mL). A negative result must be combined with clinical  observations, patient history, and epidemiological information. If result is POSITIVE SARS-CoV-2 target nucleic acids are DETECTED. The SARS-CoV-2 RNA is generally detectable in upper and lower  respiratory specimens dur ing the acute phase of infection.  Positive  results are indicative of active infection with SARS-CoV-2.  Clinical  correlation with patient history and other diagnostic information is  necessary to determine patient infection status.  Positive results do  not rule out bacterial infection or co-infection with other viruses. If result is PRESUMPTIVE POSTIVE SARS-CoV-2 nucleic acids MAY BE PRESENT.   A presumptive positive result was obtained on the submitted  specimen  and confirmed on repeat testing.  While 2019 novel coronavirus  (SARS-CoV-2) nucleic acids may be present in the submitted sample  additional confirmatory testing may be necessary for epidemiological  and / or clinical management purposes  to differentiate between  SARS-CoV-2 and other Sarbecovirus currently known to infect humans.  If clinically indicated additional testing with an alternate test  methodology [phone]) is advised. The SARS-CoV-2 RNA is generally  detectable in upper and lower respiratory sp ecimens during the acute  phase of infection. The expected result is Negative. Fact Sheet for Patients:  StrictlyIdeas.no Fact Sheet for Healthcare Providers: BankingDealers.co.za This test is not yet approved or cleared by the [place] FDA and has been authorized for detection and/or diagnosis of SARS-CoV-2 by FDA under an Emergency Use Authorization (EUA).  This EUA will remain in effect (meaning this test can be used) for the duration of the COVID-19 declaration under Section 564(b)(1) of the Act, 21 U.S.C. section 360bbb-3(b)(1), unless the authorization is terminated or revoked sooner. Performed at [facility], [place] [address], [place], [place] [address]      Radiology Studies: No results found.   Scheduled Meds: . allopurinol  300 mg Oral Daily  . amLODipine  10 mg Oral Daily  . aspirin EC  81 mg Oral Daily  . furosemide  80 mg Intravenous Q12H  . insulin aspart  0-15 Units Subcutaneous TID WC  . insulin aspart  6 Units Subcutaneous TID WC  . insulin glargine  25 Units Subcutaneous BID  . isosorbide mononitrate  120 mg Oral BID  . metoprolol succinate  150 mg Oral Daily  . pantoprazole  40 mg Oral BID AC  . pneumococcal 23 valent vaccine  0.5 mL Intramuscular Tomorrow-1000  . rosuvastatin  40 mg Oral Daily  . sodium chloride flush  3 mL Intravenous Q12H  . ticagrelor  90 mg Oral BID    Continuous Infusions: . heparin 1,100 Units/hr (08/18/19 0610)     LOS: 1 day   Time spent: 30 minutes  Darliss Cheney, MD [facility]  08/18/2019, 12:29 PM

## 2019-08-18 NOTE — Progress Notes (Signed)
ANTICOAGULATION CONSULT NOTE:  Follow Up  Pharmacy Consult for Heparin Indication: chest pain/ACS  Allergies  Allergen Reactions  . Chlorthalidone Other (See Comments)    Causes dehydration, kidneys shut down    Patient Measurements: Height: 5\' 9"  (175.3 cm) Weight: (!) 314 lb 6.4 oz (142.6 kg) IBW/kg (Calculated) : 70.7 Heparin Dosing Weight: 106.8 kg   Vital Signs: Temp: 97.9 F (36.6 C) (09/04 2055) Temp Source: Oral (09/04 2055) BP: 134/74 (09/04 2055) Pulse Rate: 61 (09/04 2055)  Labs: Recent Labs    08/16/19 1158 08/16/19 1411  08/17/19 0147 08/17/19 1028 08/17/19 1839 08/18/19 0221  HGB 12.5*  --   --  12.6*  --   --  12.5*  HCT 40.3  --   --  39.2  --   --  38.2*  PLT 184  --   --  164  --   --  170  HEPARINUNFRC  --   --    < > 0.60 0.81* 0.70 0.48  CREATININE 3.03*  --   --  2.80*  --   --  2.68*  TROPONINIHS 12 14  --   --   --   --   --    < > = values in this interval not displayed.    Estimated Creatinine Clearance: 41.3 mL/min (A) (by C-G formula based on SCr of 2.68 mg/dL (H)).   Assessment: 63 yr old male presenting with intermittent chest pain for the last 7-10 days and increased lower leg edema. PMH includes long-standing CAD with multiple PCIs, HTN, DM2, obesity, HDL, and CKD. No anticoagulation PTA. No reports of bleeding noted. Pharmacy consulted to dose heparin.  H/H, platelets stable. Heparin level 0.48 this morning Goal of Therapy:  Heparin level 0.3-0.7 units/ml Monitor platelets by anticoagulation protocol: Yes   Plan:  Continue heparin at 1100 units/ml Heparin level and CBC daily Monitor for signs/sx of bleeding  Thanks for allowing pharmacy to be a part of this patient's care.  Excell Seltzer, PharmD Clinical Pharmacist

## 2019-08-18 NOTE — Progress Notes (Signed)
Pt right forearm IV heparin infiltrated.  Site bruised and swollen.  IV discontinued and pressure held to achieve hemostasis.  Radial pulse 2+.  Site soft and ecchymotic. Cold compress applied and arm elevated.  Will cont to monitor closely.

## 2019-08-18 NOTE — Progress Notes (Addendum)
Progress Note  Patient Name: Joshua [name] Date of Encounter: 08/18/2019  Primary Cardiologist: [name], MD   Subjective   Feeling better. No recurrent chest pain. No orthopnea or dyspnea  Inpatient Medications    Scheduled Meds: . allopurinol  300 mg Oral Daily  . amLODipine  10 mg Oral Daily  . aspirin EC  81 mg Oral Daily  . furosemide  80 mg Intravenous Q12H  . insulin aspart  0-15 Units Subcutaneous TID WC  . insulin aspart  6 Units Subcutaneous TID WC  . insulin glargine  25 Units Subcutaneous BID  . isosorbide mononitrate  120 mg Oral BID  . metoprolol succinate  150 mg Oral Daily  . pantoprazole  40 mg Oral BID AC  . pneumococcal 23 valent vaccine  0.5 mL Intramuscular Tomorrow-1000  . potassium chloride  40 mEq Oral Once  . rosuvastatin  40 mg Oral Daily  . sodium chloride flush  3 mL Intravenous Q12H  . ticagrelor  90 mg Oral BID   Continuous Infusions: . heparin 1,100 Units/hr (08/18/19 0610)   PRN Meds: acetaminophen **OR** acetaminophen, albuterol, nitroGLYCERIN, ondansetron **OR** ondansetron (ZOFRAN) IV, senna-docusate   Vital Signs    Vitals:   08/17/19 1528 08/17/19 2019 08/17/19 2055 08/18/19 0547  BP: 122/70  134/74 133/78  Pulse: 65 66 61 66  Resp:  $Remo'18 18 18  'pUIQh$ Temp: 98.1 F (36.7 C)  97.9 F (36.6 C) 98.1 F (36.7 C)  TempSrc: Oral  Oral Oral  SpO2: 99% 99% 100% 98%  Weight:    (!) 140.6 kg  Height:        Intake/Output Summary (Last 24 hours) at 08/18/2019 0815 Last data filed at 08/18/2019 0610 Gross per 24 hour  Intake 1433.18 ml  Output 4325 ml  Net -2891.82 ml   Last 3 Weights 08/18/2019 08/17/2019 08/16/2019  Weight (lbs) 310 lb 314 lb 6.4 oz 321 lb 9.6 oz  Weight (kg) 140.615 kg 142.611 kg 145.877 kg      Telemetry    NSR - Personally Reviewed  ECG    NSR normal - Personally Reviewed  Physical Exam   Affect appropriate Overweight black male  HEENT: normal Neck supple with no adenopathy JVP normal no bruits no  thyromegaly Lungs clear with no wheezing and good diaphragmatic motion Heart:  S1/S2 no murmur, no rub, gallop or click PMI normal Abdomen: benighn, BS positve, no tenderness, no AAA no bruit.  No HSM or HJR Right radial cath sight A  No edema Neuro non-focal Skin warm and dry No muscular weakness   Labs    High Sensitivity Troponin:   Recent Labs  Lab 08/16/19 1158 08/16/19 1411  TROPONINIHS 12 14      Chemistry Recent Labs  Lab 08/16/19 1158 08/17/19 0147 08/17/19 0402 08/18/19 0221  NA 138 138  --  137  K 3.5 3.6  --  3.2*  CL 103 106  --  104  CO2 20* 19*  --  23  GLUCOSE 264* 215*  --  211*  BUN 43* 46*  --  44*  CREATININE 3.03* 2.80*  --  2.68*  CALCIUM 8.9 8.7*  --  8.9  PROT  --  7.3  --   --   ALBUMIN  --  3.3*  --   --   AST  --  21  --   --   ALT  --  QUANTITY NOT SUFFICIENT, UNABLE TO PERFORM TEST 17  --  ALKPHOS  --  112  --   --   BILITOT  --  QUANTITY NOT SUFFICIENT, UNABLE TO PERFORM TEST 0.5  --   GFRNONAA 21* 23*  --  25*  GFRAA 25* 27*  --  29*  ANIONGAP 15 13  --  10     Hematology Recent Labs  Lab 08/16/19 1158 08/17/19 0147 08/18/19 0221  WBC 6.8 6.7 6.7  RBC 4.59 4.62 4.50  HGB 12.5* 12.6* 12.5*  HCT 40.3 39.2 38.2*  MCV 87.8 84.8 84.9  MCH 27.2 27.3 27.8  MCHC 31.0 32.1 32.7  RDW 16.1* 15.9* 16.0*  PLT 184 164 170    BNP Recent Labs  Lab 08/16/19 2133  BNP 81.1     DDimer No results for input(s): DDIMER in the last 168 hours.   Radiology    Dg Chest 2 View  Result Date: 08/16/2019 CLINICAL DATA:  Left-sided chest pain with shortness of breath for 1 week. EXAM: CHEST - 2 VIEW COMPARISON:  April 06, 2019 FINDINGS: Cardiomediastinal silhouette is normal. Mediastinal contours appear intact. There is no evidence of focal airspace consolidation, pleural effusion or pneumothorax. Osseous structures are without acute abnormality. Stigmata of diffuse idiopathic skeletal hyperostosis of the thoracic spine. Soft tissues are  grossly normal. IMPRESSION: No active cardiopulmonary disease. Electronically Signed   By: Ted Mcalpine M.D.   On: 08/16/2019 12:31    Cardiac Studies   Echo 04/07/2019 IMPRESSIONS  1. The left ventricle has normal systolic function, with an ejection fraction of 55-60%. The cavity size was normal. Indeterminate diastolic filling due to E-A fusion. 2. LV regional wall motion abnormalities could not be completely assessed due to suboptimal acoustic windows. Grossly normal wall motion. 3. The inferior vena cava was dilated in size with <50% respiratory variability.  CORONARY STENT INTERVENTION4/27/2020  LEFT HEART CATH AND CORONARY ANGIOGRAPHY  Conclusion     Previously placed Mid LAD drug eluting stent is widely patent.  Ost 1st Diag to 1st Diag lesion is 40% stenosed.  Dist LAD lesion is 10% stenosed.  Previously placed 2nd Mrg drug eluting stent is widely patent.  3rd Mrg lesion is 30% stenosed.  Mid Cx lesion is 50% stenosed.  RPDA lesion is 30% stenosed.  Prox RCA to Mid RCA lesion is 40% stenosed.  Mid RCA lesion is 25% stenosed.  LV end diastolic pressure is normal.  There is no aortic valve stenosis.  Ost LAD to Prox LAD lesion is 90% stenosed. In stent restenosis.  Scoring balloon angioplasty was performed using a BALLOON WOLVERINE 3.75X10, followed by 4.0 [place] balloon.  Prox LAD lesion is 90% stenosed.  Post intervention, there is a 0% residual stenosis.     Patient Profile     63 y.o. male with a hx of long-standing CAD with multiple PCIs, intermittent medication noncompliance, HTN, DM2, obesity, HDL, CKD, Sleep apnea, morbid obesity who is being seen  for the evaluation of chest pain  Assessment & Plan    1. Unstable angina. Post PCI/DES proximal and distal RCA/Lad and OM2  Last cath 04/09/19 with proximal in stent restenosis and repeat intervention Plan for cath on Tuesday if cr stabilizes Improved from 3.0 to 2.68 this am   2. Acute on  chronic diastolic CHF. Much improved continue current diuretics   3. Acute on chronic renal failure. Cr stable post cath 2.68  4. DM poorly controlled. A1c > 15 in April. Now 10.  Primary team to manage. Indicates he couldn't afford his meds  in April   5. HLD. On high dose statin. Excellent control   6. HTN. Continue home meds. Monitor with diuresis. Will likely back off on diuretics tomorrow in anticipation of cath if possible     For questions or updates, please contact [place] Please consult [email or web address] for contact info under        Signed, Jenkins Rouge, MD  08/18/2019, 8:15 AM

## 2019-08-19 LAB — BASIC METABOLIC PANEL
Anion gap: 14 (ref 5–15)
BUN: 37 mg/dL — ABNORMAL HIGH (ref 6–20)
CO2: 25 mmol/L (ref 22–32)
Calcium: 9.7 mg/dL (ref 8.9–10.3)
Chloride: 101 mmol/L (ref 98–111)
Creatinine, Ser: 2.65 mg/dL — ABNORMAL HIGH (ref 0.61–1.24)
GFR calc Af Amer: 29 mL/min — ABNORMAL LOW (ref >60)
GFR calc non Af Amer: 25 mL/min — ABNORMAL LOW (ref >60)
Glucose, Bld: 122 mg/dL — ABNORMAL HIGH (ref 70–99)
Potassium: 3.2 mmol/L — ABNORMAL LOW (ref 3.5–5.1)
Sodium: 140 mmol/L (ref 135–145)

## 2019-08-19 LAB — CBC WITH DIFFERENTIAL/PLATELET
Abs Immature Granulocytes: 0.06 10*3/uL (ref 0.00–0.07)
Basophils Absolute: 0 10*3/uL (ref 0.0–0.1)
Basophils Relative: 0 %
Eosinophils Absolute: 0.1 10*3/uL (ref 0.0–0.5)
Eosinophils Relative: 1 %
HCT: 44.1 % (ref 39.0–52.0)
Hemoglobin: 14.1 g/dL (ref 13.0–17.0)
Immature Granulocytes: 1 %
Lymphocytes Relative: 25 %
Lymphs Abs: 2.1 10*3/uL (ref 0.7–4.0)
MCH: 27.6 pg (ref 26.0–34.0)
MCHC: 32 g/dL (ref 30.0–36.0)
MCV: 86.3 fL (ref 80.0–100.0)
Monocytes Absolute: 0.9 10*3/uL (ref 0.1–1.0)
Monocytes Relative: 10 %
Neutro Abs: 5.2 10*3/uL (ref 1.7–7.7)
Neutrophils Relative %: 63 %
Platelets: 198 10*3/uL (ref 150–400)
RBC: 5.11 MIL/uL (ref 4.22–5.81)
RDW: 16.1 % — ABNORMAL HIGH (ref 11.5–15.5)
WBC: 8.3 10*3/uL (ref 4.0–10.5)
nRBC: 0 % (ref 0.0–0.2)

## 2019-08-19 LAB — GLUCOSE, CAPILLARY
Glucose-Capillary: 112 mg/dL — ABNORMAL HIGH (ref 70–99)
Glucose-Capillary: 169 mg/dL — ABNORMAL HIGH (ref 70–99)
Glucose-Capillary: 207 mg/dL — ABNORMAL HIGH (ref 70–99)
Glucose-Capillary: 278 mg/dL — ABNORMAL HIGH (ref 70–99)

## 2019-08-19 LAB — HEPARIN LEVEL (UNFRACTIONATED): Heparin Unfractionated: 0.46 IU/mL (ref 0.30–0.70)

## 2019-08-19 MED ORDER — POTASSIUM CHLORIDE CRYS ER 20 MEQ PO TBCR
40.0000 meq | EXTENDED_RELEASE_TABLET | Freq: Three times a day (TID) | ORAL | Status: AC
  Administered 2019-08-19 (×2): 40 meq via ORAL
  Filled 2019-08-19 (×2): qty 2

## 2019-08-19 NOTE — Progress Notes (Signed)
Progress Note  Patient Name: Joshua [name] Date of Encounter: 08/19/2019  Primary Cardiologist: [name], MD   Subjective   No chest pain BS only 112 this am Right forearm infiltration with ecchymosis   Inpatient Medications    Scheduled Meds: . allopurinol  300 mg Oral Daily  . amLODipine  10 mg Oral Daily  . aspirin EC  81 mg Oral Daily  . furosemide  80 mg Intravenous Q12H  . insulin aspart  0-15 Units Subcutaneous TID WC  . insulin aspart  6 Units Subcutaneous TID WC  . insulin glargine  30 Units Subcutaneous BID  . isosorbide mononitrate  120 mg Oral BID  . metoprolol succinate  150 mg Oral Daily  . pantoprazole  40 mg Oral BID AC  . pneumococcal 23 valent vaccine  0.5 mL Intramuscular Tomorrow-1000  . potassium chloride  40 mEq Oral TID  . rosuvastatin  40 mg Oral Daily  . sodium chloride flush  3 mL Intravenous Q12H  . ticagrelor  90 mg Oral BID   Continuous Infusions: . heparin 1,100 Units/hr (08/19/19 0520)   PRN Meds: acetaminophen **OR** acetaminophen, albuterol, nitroGLYCERIN, ondansetron **OR** ondansetron (ZOFRAN) IV, senna-docusate   Vital Signs    Vitals:   08/18/19 2019 08/19/19 0517 08/19/19 0530 08/19/19 0551  BP: 134/79  116/74 (!) 117/96  Pulse: (!) 59   76  Resp: 15   19  Temp: (!) 97.5 F (36.4 C)   98 F (36.7 C)  TempSrc: Oral   Oral  SpO2: 97%   98%  Weight:  (!) 138.8 kg    Height:        Intake/Output Summary (Last 24 hours) at 08/19/2019 0827 Last data filed at 08/19/2019 0600 Gross per 24 hour  Intake 1075.71 ml  Output 1900 ml  Net -824.29 ml   Last 3 Weights 08/19/2019 08/18/2019 08/17/2019  Weight (lbs) 305 lb 14.4 oz 310 lb 314 lb 6.4 oz  Weight (kg) 138.755 kg 140.615 kg 142.611 kg      Telemetry    NSR - Personally Reviewed  ECG    NSR normal - Personally Reviewed  Physical Exam   Affect appropriate Overweight black male  HEENT: normal Neck supple with no adenopathy JVP normal no bruits no thyromegaly  Lungs clear with no wheezing and good diaphragmatic motion Heart:  S1/S2 no murmur, no rub, gallop or click PMI normal Abdomen: benighn, BS positve, no tenderness, no AAA no bruit.  No HSM or HJR Right radial cath sight A  No edema Neuro non-focal Skin warm and dry No muscular weakness   Labs    High Sensitivity Troponin:   Recent Labs  Lab 08/16/19 1158 08/16/19 1411  TROPONINIHS 12 14      Chemistry Recent Labs  Lab 08/17/19 0147 08/17/19 0402 08/18/19 0221 08/19/19 0607  NA 138  --  137 140  K 3.6  --  3.2* 3.2*  CL 106  --  104 101  CO2 19*  --  23 25  GLUCOSE 215*  --  211* 122*  BUN 46*  --  44* 37*  CREATININE 2.80*  --  2.68* 2.65*  CALCIUM 8.7*  --  8.9 9.7  PROT 7.3  --   --   --   ALBUMIN 3.3*  --   --   --   AST 21  --   --   --   ALT QUANTITY NOT SUFFICIENT, UNABLE TO PERFORM TEST 17  --   --  ALKPHOS 112  --   --   --   BILITOT QUANTITY NOT SUFFICIENT, UNABLE TO PERFORM TEST 0.5  --   --   GFRNONAA 23*  --  25* 25*  GFRAA 27*  --  29* 29*  ANIONGAP 13  --  10 14     Hematology Recent Labs  Lab 08/17/19 0147 08/18/19 0221 08/19/19 0607  WBC 6.7 6.7 8.3  RBC 4.62 4.50 5.11  HGB 12.6* 12.5* 14.1  HCT 39.2 38.2* 44.1  MCV 84.8 84.9 86.3  MCH 27.3 27.8 27.6  MCHC 32.1 32.7 32.0  RDW 15.9* 16.0* 16.1*  PLT 164 170 198    BNP Recent Labs  Lab 08/16/19 2133  BNP 81.1     DDimer No results for input(s): DDIMER in the last 168 hours.   Radiology    No results found.  Cardiac Studies   Echo 04/07/2019 IMPRESSIONS  1. The left ventricle has normal systolic function, with an ejection fraction of 55-60%. The cavity size was normal. Indeterminate diastolic filling due to E-A fusion. 2. LV regional wall motion abnormalities could not be completely assessed due to suboptimal acoustic windows. Grossly normal wall motion. 3. The inferior vena cava was dilated in size with <50% respiratory variability.  CORONARY STENT  INTERVENTION4/27/2020  LEFT HEART CATH AND CORONARY ANGIOGRAPHY  Conclusion     Previously placed Mid LAD drug eluting stent is widely patent.  Ost 1st Diag to 1st Diag lesion is 40% stenosed.  Dist LAD lesion is 10% stenosed.  Previously placed 2nd Mrg drug eluting stent is widely patent.  3rd Mrg lesion is 30% stenosed.  Mid Cx lesion is 50% stenosed.  RPDA lesion is 30% stenosed.  Prox RCA to Mid RCA lesion is 40% stenosed.  Mid RCA lesion is 25% stenosed.  LV end diastolic pressure is normal.  There is no aortic valve stenosis.  Ost LAD to Prox LAD lesion is 90% stenosed. In stent restenosis.  Scoring balloon angioplasty was performed using a BALLOON WOLVERINE 3.75X10, followed by 4.0 [place] balloon.  Prox LAD lesion is 90% stenosed.  Post intervention, there is a 0% residual stenosis.     Patient Profile     63 y.o. male with a hx of long-standing CAD with multiple PCIs, intermittent medication noncompliance, HTN, DM2, obesity, HDL, CKD, Sleep apnea, morbid obesity who is being seen  for the evaluation of chest pain  Assessment & Plan    1. Unstable angina. Post PCI/DES proximal and distal RCA/Lad and OM2  Last cath 04/09/19 with proximal in stent restenosis and repeat intervention Plan for cath on Tuesday if cr stabilizes Improved from 3.0 to 2.6 this am will hold diuretics in am and hydrate gently for cath Tuesday Ok to cath cores only for Cr around 2.6 intervention may need to be staged Consider cath from left radial given bruising from infiltration in right forearm   2. Acute on chronic diastolic CHF. Much improved continue current diuretics   3. Acute on chronic renal failure. Cr stable post cath 2.68  4. DM poorly controlled. A1c > 15 in April. Now 10.  Primary team to manage. Indicates he couldn't afford his meds in April   5. HLD. On high dose statin. Excellent control   6. HTN. Continue home meds. Monitor with diuresis. Will likely back off on  diuretics tomorrow in anticipation of cath if possible     For questions or updates, please contact [place] Please consult [email or web address] for contact info  under        Signed, Jenkins Rouge, MD  08/19/2019, 8:27 AM

## 2019-08-19 NOTE — Progress Notes (Signed)
ANTICOAGULATION CONSULT NOTE:  Follow Up  Pharmacy Consult for Heparin Indication: chest pain/ACS  Allergies  Allergen Reactions  . Chlorthalidone Other (See Comments)    Causes dehydration, kidneys shut down    Patient Measurements: Height: 5\' 9"  (175.3 cm) Weight: (!) 305 lb 14.4 oz (138.8 kg) IBW/kg (Calculated) : 70.7 Heparin Dosing Weight: 104.6 kg   Vital Signs: Temp: 98 F (36.7 C) (09/06 0551) Temp Source: Oral (09/06 0551) BP: 117/96 (09/06 0551) Pulse Rate: 76 (09/06 0551)  Labs: Recent Labs    08/16/19 1411  08/17/19 0147  08/17/19 1839 08/18/19 0221 08/19/19 0607  HGB  --    < > 12.6*  --   --  12.5* 14.1  HCT  --   --  39.2  --   --  38.2* 44.1  PLT  --   --  164  --   --  170 198  HEPARINUNFRC  --   --  0.60   < > 0.70 0.48 0.46  CREATININE  --   --  2.80*  --   --  2.68* 2.65*  TROPONINIHS 14  --   --   --   --   --   --    < > = values in this interval not displayed.    Estimated Creatinine Clearance: 41 mL/min (A) (by C-G formula based on SCr of 2.65 mg/dL (H)).   Assessment: 63 yr old male presenting with intermittent chest pain for the last 7-10 days and increased lower leg edema. PMH includes long-standing CAD with multiple PCIs, HTN, DM2, obesity, HDL, and CKD. No anticoagulation PTA. Cath potentially planned for Tuesday per Dr. Johnsie Cancel.  Per chart, patient does have ecchymosis on R arm, but H&H is increased since yesterday at 14.1/441 and plts are wnl. Heparin level is therapeutic at 0.46.  Goal of Therapy:  Heparin level 0.3-0.7 units/ml Monitor platelets by anticoagulation protocol: Yes   Plan:  Continue heparin at 1100 units/ml Heparin level and CBC daily Monitor for signs/sx of bleeding  Thanks for allowing pharmacy to be a part of this patient's care.   Eddie Candle, PharmD PGY-1 Pharmacy Resident   Please check amion for clinical pharmacist contact number

## 2019-08-19 NOTE — Progress Notes (Signed)
PROGRESS NOTE    Joshua [name]  QZR:[id number] DOB: [birth year] DOA: 08/16/2019 PCP: [name], MD  Brief Narrative: Joshua [name], Joshua [name], Joshua [name], Joshua [name], Joshua [name], Joshua [name], Joshua [name], Joshua [name], Joshua to the ED complaining of chest pain.  Patient reports intermittent left-sided chest pain [name] the past 10 days worsening over the past [name] days, describes it as pressure, nonradiating, 7/10 at its worse, minimally relieved with nitroglycerin with associated chronic shortness of breath, lower extremity edema and weight gain of about 25 pounds in the last couple of months.    Last catheterization was done in April [name] NSTEMI, which showed stent restenosis in the proximal LAD treated with cutting balloon PCI. Admitted with unstable angina and acute on chronic diastolic CHF  Assessment & Plan:   Unstable angina with extensive Joshua hx s/p multiple stents -extensive cardiac [name] -Last cath-03/2019 showed Ost LAD to Prox LAD lesion 90% stenosed in-stent restenosis s/p balloon angioplasty -Cardiology following, continue IV heparin, aspirin, brillinta, metoprolol, crestor -Plan [name] cath on Tuesday if renal function improves.  Per discussion with cardiology, he will get Lasix today but that will be held tomorrow with some IV fluids in an effort to improve his renal function to be ready [name] cath on Tuesday.  Acute on chronic diastolic HF -Last ECHO on 03/2019- EF of 55-60% -Continue Lasix IV 80 mg every 12.  Not hypoxic.  AKI on Joshua [name] stage 3 Baseline Cr around [name], but came in with 3.03 which has now improved and is down to only [name].65.  Continue current management.    Hypokalemia: 3.1-second.  Will replace orally and recheck in the morning.  Uncontrolled Joshua [name] with hyperglycemia Blood sugar better controlled.  Continue 30 units of Lantus and SSI.   HTN Stable Continue home regimen with metoprolol, amlodipine, imdur  HLD Continue crestor  GERD Continue PPI BID  Gout Stable Continue Allopurinol  OSA Continue cpap  Joshua [name] Counseling provided.  DVT prophylaxis: Heparin drip  Code Status: Full Family Communication: None at bedside Disposition Plan: Likely home after cath on Tuesday.  Consults : Cardiology     Procedures:   Antimicrobials:    Subjective: Patient seen and examined.  Feels better.  No more chest pain or shortness of breath.  Objective: Vitals:   08/18/19 2019 08/19/19 0517 08/19/19 0530 08/19/19 0551  BP: 134/79  116/74 (!) 117/96  Pulse: (!) 59   76  Resp: 15   19  Temp: (!) 97.5 F (36.4 C)   98 F (36.7 C)  TempSrc: Oral   Oral  SpO2: 97%   98%  Weight:  (!) 138.8 kg    Height:        Intake/Output Summary (Last 24 hours) at 08/19/2019 1111 Last data filed at 08/19/2019 0600 Gross per 24 hour  Intake 715.71 ml  Output 1900 ml  Net -1184.29 ml   Filed Weights   08/17/19 0524 08/18/19 0547 08/19/19 0517  Weight: (!) 142.6 kg (!) 140.6 kg (!) 138.8 kg    Examination:  General exam: Appears calm and comfortable, morbidly obese Respiratory system: Diminished breath sounds due to Joshua [name]. Respiratory effort normal. Cardiovascular system: S1 & S2 heard, RRR. No JVD, murmurs, rubs, gallops or clicks. No pedal edema. Gastrointestinal system: Abdomen is nondistended, soft and nontender. No organomegaly or masses felt. Normal bowel sounds heard. Central nervous system: Alert and  oriented. No focal neurological deficits. Extremities: Symmetric 5 x 5 power. Skin: No rashes, lesions or ulcers.  Psychiatry: Judgement and insight appear normal. Mood & affect appropriate.   Data Reviewed:   CBC: Recent Labs  Lab 08/16/19 1158 08/17/19 0147 08/18/19 0221 08/19/19 0607  WBC 6.8 6.7 6.7 8.3  NEUTROABS  --  4.1 4.3 5.[name]  HGB 12.5* 12.6* 12.5* 14.1  HCT 40.3 39.[name]  38.[name]* 44.1  MCV 87.8 84.8 84.9 86.3  PLT 184 164 170 785   Basic Metabolic Panel: Recent Labs  Lab 08/16/19 1158 08/17/19 0147 08/18/19 0221 08/19/19 0607  NA 138 138 137 140  K 3.5 3.6 3.[name]* 3.[name]*  CL 103 106 104 101  CO2 20* 19* 23 25  GLUCOSE 264* 215* 211* 122*  BUN 43* 46* 44* 37*  CREATININE 3.03* [name].80* [name].68* [name].65*  CALCIUM 8.9 8.7* 8.9 9.7   GFR: Estimated Creatinine Clearance: 41 mL/min (A) (by C-G formula based on SCr of [name].65 mg/dL (H)). Liver Function Tests: Recent Labs  Lab 08/17/19 0147 08/17/19 0402  AST 21  --   ALT QUANTITY NOT SUFFICIENT, UNABLE TO PERFORM TEST 17  ALKPHOS 112  --   BILITOT QUANTITY NOT SUFFICIENT, UNABLE TO PERFORM TEST 0.5  PROT 7.3  --   ALBUMIN 3.3*  --    No results [name] input(s): LIPASE, AMYLASE in the last 168 hours. No results [name] input(s): AMMONIA in the last 168 hours. Coagulation Profile: No results [name] input(s): INR, PROTIME in the last 168 hours. Cardiac Enzymes: No results [name] input(s): CKTOTAL, CKMB, CKMBINDEX, TROPONINI in the last 168 hours. BNP (last 3 results) No results [name] input(s): PROBNP in the last 8760 hours. HbA1C: Recent Labs    08/16/19 2133  HGBA1C 10.[name]*   CBG: Recent Labs  Lab 08/18/19 0730 08/18/19 1127 08/18/19 1701 08/18/19 2148 08/19/19 0756  GLUCAP 211* 253* 93 138* 112*   Lipid Profile: Recent Labs    08/17/19 0402  CHOL 168  HDL 85  LDLCALC 71  TRIG 60  CHOLHDL [name].0   Thyroid Function Tests: Recent Labs    08/16/19 2133  TSH 1.675   Anemia Panel: No results [name] input(s): VITAMINB12, FOLATE, FERRITIN, TIBC, IRON, RETICCTPCT in the last 72 hours. Urine analysis:    Component Value Date/Time   COLORURINE STRAW (A) 08/16/2019 2120   APPEARANCEUR CLEAR 08/16/2019 2120   LABSPEC 1.005 08/16/2019 2120   PHURINE 5.0 08/16/2019 2120   GLUCOSEU NEGATIVE 08/16/2019 2120   HGBUR SMALL (A) 08/16/2019 2120   BILIRUBINUR NEGATIVE 08/16/2019 2120   KETONESUR NEGATIVE 08/16/2019 2120    PROTEINUR 100 (A) 08/16/2019 2120   UROBILINOGEN 0.[name] 11/01/2011 1410   NITRITE NEGATIVE 08/16/2019 2120   LEUKOCYTESUR NEGATIVE 08/16/2019 2120   Sepsis Labs: $[email or web address]'[email or web address]$ (procalcitonin:4,lacticidven:4)  ) Recent Results (from the past 240 hour(s))  SARS Coronavirus [name] [facility] order, Performed in [facility] hospital lab) Nasopharyngeal Nasopharyngeal Swab     Status: None   Collection Time: 08/16/19  [name]:50 PM   Specimen: Nasopharyngeal Swab  Result Value Ref Range Status   SARS Coronavirus [name] NEGATIVE NEGATIVE Final    Comment: (NOTE) If result is NEGATIVE SARS-CoV-[name] target nucleic acids are NOT DETECTED. The SARS-CoV-[name] RNA is generally detectable in upper and lower  respiratory specimens during the acute phase of infection. The lowest  concentration of SARS-CoV-[name] viral copies this assay can detect is 250  copies / mL. A negative result does not preclude SARS-CoV-[name] infection  and should not be used as the  sole basis [name] treatment or other  patient management decisions.  A negative result may occur with  improper specimen collection / handling, submission of specimen other  than nasopharyngeal swab, presence of viral mutation(s) within the  areas targeted by this assay, and inadequate number of viral copies  (<250 copies / mL). A negative result must be combined with clinical  observations, patient [name], and epidemiological information. If result is POSITIVE SARS-CoV-[name] target nucleic acids are DETECTED. The SARS-CoV-[name] RNA is generally detectable in upper and lower  respiratory specimens dur ing the acute phase of infection.  Positive  results are indicative of active infection with SARS-CoV-[name].  Clinical  correlation with patient [name] and other diagnostic information is  necessary to determine patient infection status.  Positive results do  not rule out bacterial infection or co-infection with other viruses. If result is PRESUMPTIVE POSTIVE SARS-CoV-[name] nucleic acids MAY BE  PRESENT.   A presumptive positive result was obtained on the submitted specimen  and confirmed on repeat testing.  While 2019 novel coronavirus  (SARS-CoV-[name]) nucleic acids may be present in the submitted sample  additional confirmatory testing may be necessary [name] epidemiological  and / or clinical management purposes  to differentiate between  SARS-CoV-[name] and other Sarbecovirus currently known to infect humans.  If clinically indicated additional testing with an alternate test  methodology [phone]) is advised. The SARS-CoV-[name] RNA is generally  detectable in upper and lower respiratory sp ecimens during the acute  phase of infection. The expected result is Negative. Fact Sheet [name] Patients:  StrictlyIdeas.no Fact Sheet [name] Healthcare Providers: BankingDealers.co.za This test is not yet approved or cleared by the [place] FDA and has been authorized [name] detection and/or diagnosis of SARS-CoV-[name] by FDA under an Emergency Use Authorization (EUA).  This EUA will remain in effect (meaning this test can be used) [name] the duration of the COVID-19 declaration under Section 564(b)(1) of the Act, 21 U.S.C. section 360bbb-3(b)(1), unless the authorization is terminated or revoked sooner. Performed at [facility], [place] [address], [place], [place] [address]      Radiology Studies: No results found.   Scheduled Meds: . allopurinol  300 mg Oral Daily  . amLODipine  10 mg Oral Daily  . aspirin EC  81 mg Oral Daily  . furosemide  80 mg Intravenous Q12H  . insulin aspart  0-15 Units Subcutaneous TID WC  . insulin aspart  6 Units Subcutaneous TID WC  . insulin glargine  30 Units Subcutaneous BID  . isosorbide mononitrate  120 mg Oral BID  . metoprolol succinate  150 mg Oral Daily  . pantoprazole  40 mg Oral BID AC  . pneumococcal 23 valent vaccine  0.5 mL Intramuscular Tomorrow-1000  . potassium chloride  40 mEq Oral TID  . rosuvastatin   40 mg Oral Daily  . sodium chloride flush  3 mL Intravenous Q12H  . ticagrelor  90 mg Oral BID   Continuous Infusions: . heparin 1,100 Units/hr (08/19/19 0520)     LOS: [name] days   Time spent: 26 minutes  Darliss Cheney, MD [facility]  08/19/2019, 11:11 AM

## 2019-08-19 NOTE — Progress Notes (Signed)
Patient places himself on CPAP and will call if any assistance needed.

## 2019-08-20 LAB — BASIC METABOLIC PANEL
Anion gap: 12 (ref 5–15)
BUN: 43 mg/dL — ABNORMAL HIGH (ref 6–20)
CO2: 23 mmol/L (ref 22–32)
Calcium: 9.2 mg/dL (ref 8.9–10.3)
Chloride: 101 mmol/L (ref 98–111)
Creatinine, Ser: 3.3 mg/dL — ABNORMAL HIGH (ref 0.61–1.24)
GFR calc Af Amer: 22 mL/min — ABNORMAL LOW (ref >60)
GFR calc non Af Amer: 19 mL/min — ABNORMAL LOW (ref >60)
Glucose, Bld: 231 mg/dL — ABNORMAL HIGH (ref 70–99)
Potassium: 3.9 mmol/L (ref 3.5–5.1)
Sodium: 136 mmol/L (ref 135–145)

## 2019-08-20 LAB — CBC WITH DIFFERENTIAL/PLATELET
Abs Immature Granulocytes: 0.06 10*3/uL (ref 0.00–0.07)
Basophils Absolute: 0 10*3/uL (ref 0.0–0.1)
Basophils Relative: 1 %
Eosinophils Absolute: 0.1 10*3/uL (ref 0.0–0.5)
Eosinophils Relative: 1 %
HCT: 44 % (ref 39.0–52.0)
Hemoglobin: 14.2 g/dL (ref 13.0–17.0)
Immature Granulocytes: 1 %
Lymphocytes Relative: 24 %
Lymphs Abs: 2 10*3/uL (ref 0.7–4.0)
MCH: 27.6 pg (ref 26.0–34.0)
MCHC: 32.3 g/dL (ref 30.0–36.0)
MCV: 85.4 fL (ref 80.0–100.0)
Monocytes Absolute: 0.9 10*3/uL (ref 0.1–1.0)
Monocytes Relative: 10 %
Neutro Abs: 5.3 10*3/uL (ref 1.7–7.7)
Neutrophils Relative %: 63 %
Platelets: 184 10*3/uL (ref 150–400)
RBC: 5.15 MIL/uL (ref 4.22–5.81)
RDW: 15.9 % — ABNORMAL HIGH (ref 11.5–15.5)
WBC: 8.3 10*3/uL (ref 4.0–10.5)
nRBC: 0 % (ref 0.0–0.2)

## 2019-08-20 LAB — GLUCOSE, CAPILLARY
Glucose-Capillary: 183 mg/dL — ABNORMAL HIGH (ref 70–99)
Glucose-Capillary: 217 mg/dL — ABNORMAL HIGH (ref 70–99)
Glucose-Capillary: 184 mg/dL — ABNORMAL HIGH (ref 70–99)
Glucose-Capillary: 156 mg/dL — ABNORMAL HIGH (ref 70–99)

## 2019-08-20 LAB — HEPARIN LEVEL (UNFRACTIONATED): Heparin Unfractionated: 0.42 IU/mL (ref 0.30–0.70)

## 2019-08-20 MED ORDER — ASPIRIN 81 MG PO CHEW
81.0000 mg | CHEWABLE_TABLET | ORAL | Status: AC
  Administered 2019-08-21: 81 mg via ORAL
  Filled 2019-08-20: qty 1

## 2019-08-20 MED ORDER — SODIUM CHLORIDE 0.9 % WEIGHT BASED INFUSION
1.0000 mL/kg/h | INTRAVENOUS | Status: DC
  Administered 2019-08-20 – 2019-08-21 (×2): 1 mL/kg/h via INTRAVENOUS

## 2019-08-20 MED ORDER — INSULIN GLARGINE 100 UNIT/ML ~~LOC~~ SOLN
35.0000 [IU] | Freq: Two times a day (BID) | SUBCUTANEOUS | Status: DC
  Administered 2019-08-20 – 2019-08-21 (×4): 35 [IU] via SUBCUTANEOUS
  Filled 2019-08-20 (×6): qty 0.35

## 2019-08-20 MED ORDER — SODIUM CHLORIDE 0.9% FLUSH: 3.0000 mL | Freq: Two times a day (BID) | INTRAVENOUS | Status: DC

## 2019-08-20 NOTE — Progress Notes (Signed)
patient will call if assistance is needed of CPAP.

## 2019-08-20 NOTE — Progress Notes (Signed)
PROGRESS NOTE    Joshua [name]  ZOX:[id number] DOB: 11/04/1956 DOA: 08/16/2019 PCP: [name], MD  Brief Narrative: Joshua [name] is a 63 y.o. male with medical history significant for extensive cardiac history, CAD with multiple PCI's, diabetes mellitus type 2, hypertension, hyperlipidemia, CKD, sleep apnea, morbid obesity, presents to the ED complaining of chest pain.  Patient reports intermittent left-sided chest pain for the past 10 days worsening over the past 2 days, describes it as pressure, nonradiating, 7/10 at its worse, minimally relieved with nitroglycerin with associated chronic shortness of breath, lower extremity edema and weight gain of about 25 pounds in the last couple of months.    Last catheterization was done in April for NSTEMI, which showed stent restenosis in the proximal LAD treated with cutting balloon PCI. Admitted with unstable angina and acute on chronic diastolic CHF  Assessment & Plan:   Unstable angina with extensive CAD hx s/p multiple stents -extensive cardiac history -Last cath-03/2019 showed Ost LAD to Prox LAD lesion 90% stenosed in-stent restenosis s/p balloon angioplasty -Cardiology following, continue IV heparin, aspirin, brillinta, metoprolol, crestor -Plan for cath tomorrow if renal function improves.  Cardiology plans on holding Lasix today and providing him some IV fluids to improve his renal function tomorrow.  Acute on chronic diastolic HF -Last ECHO on 03/2019- EF of 55-60% Holding Lasix today per cardiology.  AKI on CKD stage 3 Baseline Cr around 2, but came in with 3.03 which has now improved and went down to only 2.65 only to rise again to 3.3 today.  He is going to receive IV fluids per cardiology.  Hypokalemia: Resolved.  Uncontrolled Diabetes mellitus type 2 with hyperglycemia Blood sugar slightly elevated.  Increase Lantus to 35 twice daily and continue SSI.  HTN Stable Continue home regimen with metoprolol, amlodipine, imdur   HLD Continue crestor  GERD Continue PPI BID  Gout Stable Continue Allopurinol  OSA Continue cpap  Morbid obesity Counseling provided.  DVT prophylaxis: Heparin drip  Code Status: Full Family Communication: None at bedside Disposition Plan: Likely home after cath on Tuesday.  Consults : Cardiology     Procedures:   Antimicrobials:    Subjective: Seen and examined.  Remains asymptomatic with no chest pain or shortness of breath.  Objective: Vitals:   08/19/19 0551 08/19/19 1710 08/19/19 2029 08/20/19 0551  BP: (!) 117/96 130/79 139/79 (!) 143/95  Pulse: 76 72 68 75  Resp: $Remo'19  20 16  'OOvqO$ Temp: 98 F (36.7 C) 98.1 F (36.7 C) 98.2 F (36.8 C) 98.2 F (36.8 C)  TempSrc: Oral Oral Oral Oral  SpO2: 98% 97% 99% 98%  Weight:    (!) 138.8 kg  Height:        Intake/Output Summary (Last 24 hours) at 08/20/2019 1130 Last data filed at 08/20/2019 0600 Gross per 24 hour  Intake 1241 ml  Output 2175 ml  Net -934 ml   Filed Weights   08/18/19 0547 08/19/19 0517 08/20/19 0551  Weight: (!) 140.6 kg (!) 138.8 kg (!) 138.8 kg    Examination:  General exam: Appears calm and comfortable, morbidly obese Respiratory system: Clear to auscultation. Respiratory effort normal. Cardiovascular system: S1 & S2 heard, RRR. No JVD, murmurs, rubs, gallops or clicks. No pedal edema. Gastrointestinal system: Abdomen is nondistended, soft and nontender. No organomegaly or masses felt. Normal bowel sounds heard. Central nervous system: Alert and oriented. No focal neurological deficits. Extremities: Symmetric 5 x 5 power. Skin: No rashes, lesions or ulcers.  Psychiatry:  Judgement and insight appear normal. Mood & affect appropriate.    Data Reviewed:   CBC: Recent Labs  Lab 08/16/19 1158 08/17/19 0147 08/18/19 0221 08/19/19 0607 08/20/19 0253  WBC 6.8 6.7 6.7 8.3 8.3  NEUTROABS  --  4.1 4.3 5.2 5.3  HGB 12.5* 12.6* 12.5* 14.1 14.2  HCT 40.3 39.2 38.2* 44.1 44.0   MCV 87.8 84.8 84.9 86.3 85.4  PLT 184 164 170 198 182   Basic Metabolic Panel: Recent Labs  Lab 08/16/19 1158 08/17/19 0147 08/18/19 0221 08/19/19 0607 08/20/19 0253  NA 138 138 137 140 136  K 3.5 3.6 3.2* 3.2* 3.9  CL 103 106 104 101 101  CO2 20* 19* $Remov'23 25 23  'fhXnFX$ GLUCOSE 264* 215* 211* 122* 231*  BUN 43* 46* 44* 37* 43*  CREATININE 3.03* 2.80* 2.68* 2.65* 3.30*  CALCIUM 8.9 8.7* 8.9 9.7 9.2   GFR: Estimated Creatinine Clearance: 33 mL/min (A) (by C-G formula based on SCr of 3.3 mg/dL (H)). Liver Function Tests: Recent Labs  Lab 08/17/19 0147 08/17/19 0402  AST 21  --   ALT QUANTITY NOT SUFFICIENT, UNABLE TO PERFORM TEST 17  ALKPHOS 112  --   BILITOT QUANTITY NOT SUFFICIENT, UNABLE TO PERFORM TEST 0.5  PROT 7.3  --   ALBUMIN 3.3*  --    No results for input(s): LIPASE, AMYLASE in the last 168 hours. No results for input(s): AMMONIA in the last 168 hours. Coagulation Profile: No results for input(s): INR, PROTIME in the last 168 hours. Cardiac Enzymes: No results for input(s): CKTOTAL, CKMB, CKMBINDEX, TROPONINI in the last 168 hours. BNP (last 3 results) No results for input(s): PROBNP in the last 8760 hours. HbA1C: No results for input(s): HGBA1C in the last 72 hours. CBG: Recent Labs  Lab 08/19/19 0756 08/19/19 1154 08/19/19 1623 08/19/19 2107 08/20/19 0753  GLUCAP 112* 169* 207* 278* 183*   Lipid Profile: No results for input(s): CHOL, HDL, LDLCALC, TRIG, CHOLHDL, LDLDIRECT in the last 72 hours. Thyroid Function Tests: No results for input(s): TSH, T4TOTAL, FREET4, T3FREE, THYROIDAB in the last 72 hours. Anemia Panel: No results for input(s): VITAMINB12, FOLATE, FERRITIN, TIBC, IRON, RETICCTPCT in the last 72 hours. Urine analysis:    Component Value Date/Time   COLORURINE STRAW (A) 08/16/2019 2120   APPEARANCEUR CLEAR 08/16/2019 2120   LABSPEC 1.005 08/16/2019 2120   PHURINE 5.0 08/16/2019 2120   GLUCOSEU NEGATIVE 08/16/2019 2120   HGBUR SMALL (A)  08/16/2019 2120   BILIRUBINUR NEGATIVE 08/16/2019 2120   KETONESUR NEGATIVE 08/16/2019 2120   PROTEINUR 100 (A) 08/16/2019 2120   UROBILINOGEN 0.2 11/01/2011 1410   NITRITE NEGATIVE 08/16/2019 2120   LEUKOCYTESUR NEGATIVE 08/16/2019 2120   Sepsis Labs: $[email or web address]'tMvKhSycOCN$ (procalcitonin:4,lacticidven:4)  ) Recent Results (from the past 240 hour(s))  SARS Coronavirus 2 [facility] order, Performed in [facility] hospital lab) Nasopharyngeal Nasopharyngeal Swab     Status: None   Collection Time: 08/16/19  2:50 PM   Specimen: Nasopharyngeal Swab  Result Value Ref Range Status   SARS Coronavirus 2 NEGATIVE NEGATIVE Final    Comment: (NOTE) If result is NEGATIVE SARS-CoV-2 target nucleic acids are NOT DETECTED. The SARS-CoV-2 RNA is generally detectable in upper and lower  respiratory specimens during the acute phase of infection. The lowest  concentration of SARS-CoV-2 viral copies this assay can detect is 250  copies / mL. A negative result does not preclude SARS-CoV-2 infection  and should not be used as the sole basis for treatment or other  patient management  decisions.  A negative result may occur with  improper specimen collection / handling, submission of specimen other  than nasopharyngeal swab, presence of viral mutation(s) within the  areas targeted by this assay, and inadequate number of viral copies  (<250 copies / mL). A negative result must be combined with clinical  observations, patient history, and epidemiological information. If result is POSITIVE SARS-CoV-2 target nucleic acids are DETECTED. The SARS-CoV-2 RNA is generally detectable in upper and lower  respiratory specimens dur ing the acute phase of infection.  Positive  results are indicative of active infection with SARS-CoV-2.  Clinical  correlation with patient history and other diagnostic information is  necessary to determine patient infection status.  Positive results do  not rule out bacterial infection or  co-infection with other viruses. If result is PRESUMPTIVE POSTIVE SARS-CoV-2 nucleic acids MAY BE PRESENT.   A presumptive positive result was obtained on the submitted specimen  and confirmed on repeat testing.  While 2019 novel coronavirus  (SARS-CoV-2) nucleic acids may be present in the submitted sample  additional confirmatory testing may be necessary for epidemiological  and / or clinical management purposes  to differentiate between  SARS-CoV-2 and other Sarbecovirus currently known to infect humans.  If clinically indicated additional testing with an alternate test  methodology [phone]) is advised. The SARS-CoV-2 RNA is generally  detectable in upper and lower respiratory sp ecimens during the acute  phase of infection. The expected result is Negative. Fact Sheet for Patients:  StrictlyIdeas.no Fact Sheet for Healthcare Providers: BankingDealers.co.za This test is not yet approved or cleared by the [place] FDA and has been authorized for detection and/or diagnosis of SARS-CoV-2 by FDA under an Emergency Use Authorization (EUA).  This EUA will remain in effect (meaning this test can be used) for the duration of the COVID-19 declaration under Section 564(b)(1) of the Act, 21 U.S.C. section 360bbb-3(b)(1), unless the authorization is terminated or revoked sooner. Performed at [facility], [place] [address], [place], [place] [address]      Radiology Studies: No results found.   Scheduled Meds: . allopurinol  300 mg Oral Daily  . amLODipine  10 mg Oral Daily  . [START ON 08/21/2019] aspirin  81 mg Oral Pre-Cath  . aspirin EC  81 mg Oral Daily  . insulin aspart  0-15 Units Subcutaneous TID WC  . insulin aspart  6 Units Subcutaneous TID WC  . insulin glargine  35 Units Subcutaneous BID  . isosorbide mononitrate  120 mg Oral BID  . metoprolol succinate  150 mg Oral Daily  . pantoprazole  40 mg Oral BID AC  .  pneumococcal 23 valent vaccine  0.5 mL Intramuscular Tomorrow-1000  . rosuvastatin  40 mg Oral Daily  . sodium chloride flush  3 mL Intravenous Q12H  . sodium chloride flush  3 mL Intravenous Q12H  . ticagrelor  90 mg Oral BID   Continuous Infusions: . sodium chloride    . heparin 1,100 Units/hr (08/20/19 0357)     LOS: 3 days   Time spent: 24 minutes  Darliss Cheney, MD [facility]  08/20/2019, 11:30 AM

## 2019-08-20 NOTE — Progress Notes (Signed)
Progress Note  Patient Name: Joshua [name] Date of Encounter: 08/20/2019  Primary Cardiologist: [name], MD   Subjective   Feeling better. Last chest pain the night before last.  No orthopnea or dyspnea  Inpatient Medications    Scheduled Meds: . allopurinol  300 mg Oral Daily  . amLODipine  10 mg Oral Daily  . aspirin EC  81 mg Oral Daily  . furosemide  80 mg Intravenous Q12H  . insulin aspart  0-15 Units Subcutaneous TID WC  . insulin aspart  6 Units Subcutaneous TID WC  . insulin glargine  30 Units Subcutaneous BID  . isosorbide mononitrate  120 mg Oral BID  . metoprolol succinate  150 mg Oral Daily  . pantoprazole  40 mg Oral BID AC  . pneumococcal 23 valent vaccine  0.5 mL Intramuscular Tomorrow-1000  . rosuvastatin  40 mg Oral Daily  . sodium chloride flush  3 mL Intravenous Q12H  . ticagrelor  90 mg Oral BID   Continuous Infusions: . heparin 1,100 Units/hr (08/20/19 0357)   PRN Meds: acetaminophen **OR** acetaminophen, albuterol, nitroGLYCERIN, ondansetron **OR** ondansetron (ZOFRAN) IV, senna-docusate   Vital Signs    Vitals:   08/19/19 0551 08/19/19 1710 08/19/19 2029 08/20/19 0551  BP: (!) 117/96 130/79 139/79 (!) 143/95  Pulse: 76 72 68 75  Resp: $Remo'19  20 16  'PYYws$ Temp: 98 F (36.7 C) 98.1 F (36.7 C) 98.2 F (36.8 C) 98.2 F (36.8 C)  TempSrc: Oral Oral Oral Oral  SpO2: 98% 97% 99% 98%  Weight:    (!) 138.8 kg  Height:        Intake/Output Summary (Last 24 hours) at 08/20/2019 0731 Last data filed at 08/20/2019 0600 Gross per 24 hour  Intake 1481 ml  Output 2175 ml  Net -694 ml   Last 3 Weights 08/20/2019 08/19/2019 08/18/2019  Weight (lbs) 306 lb 305 lb 14.4 oz 310 lb  Weight (kg) 138.801 kg 138.755 kg 140.615 kg      Telemetry    NSR - Personally Reviewed  ECG    NSR normal - Personally Reviewed  Physical Exam   GEN: No acute distress.   Neck: No JVD Cardiac: RRR, no murmurs, rubs, or gallops.  Respiratory: Clear to auscultation  bilaterally. GI: Soft, nontender, non-distended  MS: no edema; No deformity. Neuro:  Nonfocal  Psych: Normal affect   Labs    High Sensitivity Troponin:   Recent Labs  Lab 08/16/19 1158 08/16/19 1411  TROPONINIHS 12 14      Chemistry Recent Labs  Lab 08/17/19 0147 08/17/19 0402 08/18/19 0221 08/19/19 0607 08/20/19 0253  NA 138  --  137 140 136  K 3.6  --  3.2* 3.2* 3.9  CL 106  --  104 101 101  CO2 19*  --  $R'23 25 23  'Jg$ GLUCOSE 215*  --  211* 122* 231*  BUN 46*  --  44* 37* 43*  CREATININE 2.80*  --  2.68* 2.65* 3.30*  CALCIUM 8.7*  --  8.9 9.7 9.2  PROT 7.3  --   --   --   --   ALBUMIN 3.3*  --   --   --   --   AST 21  --   --   --   --   ALT QUANTITY NOT SUFFICIENT, UNABLE TO PERFORM TEST 17  --   --   --   ALKPHOS 112  --   --   --   --  BILITOT QUANTITY NOT SUFFICIENT, UNABLE TO PERFORM TEST 0.5  --   --   --   GFRNONAA 23*  --  25* 25* 19*  GFRAA 27*  --  29* 29* 22*  ANIONGAP 13  --  $R'10 14 12     'NP$ Hematology Recent Labs  Lab 08/18/19 0221 08/19/19 0607 08/20/19 0253  WBC 6.7 8.3 8.3  RBC 4.50 5.11 5.15  HGB 12.5* 14.1 14.2  HCT 38.2* 44.1 44.0  MCV 84.9 86.3 85.4  MCH 27.8 27.6 27.6  MCHC 32.7 32.0 32.3  RDW 16.0* 16.1* 15.9*  PLT 170 198 184    BNP Recent Labs  Lab 08/16/19 2133  BNP 81.1     DDimer No results for input(s): DDIMER in the last 168 hours.   Radiology    No results found.  Cardiac Studies   Echo 04/07/2019 IMPRESSIONS  1. The left ventricle has normal systolic function, with an ejection fraction of 55-60%. The cavity size was normal. Indeterminate diastolic filling due to E-A fusion. 2. LV regional wall motion abnormalities could not be completely assessed due to suboptimal acoustic windows. Grossly normal wall motion. 3. The inferior vena cava was dilated in size with <50% respiratory variability.  CORONARY STENT INTERVENTION4/27/2020  LEFT HEART CATH AND CORONARY ANGIOGRAPHY  Conclusion     Previously  placed Mid LAD drug eluting stent is widely patent.  Ost 1st Diag to 1st Diag lesion is 40% stenosed.  Dist LAD lesion is 10% stenosed.  Previously placed 2nd Mrg drug eluting stent is widely patent.  3rd Mrg lesion is 30% stenosed.  Mid Cx lesion is 50% stenosed.  RPDA lesion is 30% stenosed.  Prox RCA to Mid RCA lesion is 40% stenosed.  Mid RCA lesion is 25% stenosed.  LV end diastolic pressure is normal.  There is no aortic valve stenosis.  Ost LAD to Prox LAD lesion is 90% stenosed. In stent restenosis.  Scoring balloon angioplasty was performed using a BALLOON WOLVERINE 3.75X10, followed by 4.0 [place] balloon.  Prox LAD lesion is 90% stenosed.  Post intervention, there is a 0% residual stenosis.     Patient Profile     63 y.o. male with a hx of long-standing CAD with multiple PCIs, intermittent medication noncompliance, HTN, DM2, obesity, HDL, CKD, Sleep apnea, morbid obesity who is being seen  for the evaluation of chest pain  Assessment & Plan    1. Unstable angina. Symptoms similar to prior cardiac symptoms in April. At high risk for restenosis especially of the proximal LAD.  Continue home metoprolol, amlodipine, metoprolol. IV heparin. Continue DAPT.  Plan cardiac cath tomorrow if renal function improved. Will hold lasix today and hydrate overnight.  2. Acute on chronic diastolic CHF. Weight is back to baseline.  Good diuresis overall negative 7.2 liters and weight back to baseline. Will hold IV laisx hydrate overnight for cath.   3. Acute on chronic renal failure. Baseline creatinine around 2. Up to 3 on admission. Now 3.3 with diuresis.  Uncontrolled DM may be playing a role. If renal function worsens will need to get Nephrology involved.  4. DM poorly controlled. A1c > 15 in April. Now 10.  Primary team to manage.  5. HLD. On high dose statin. Excellent control  6. HTN. Continue home meds. Controlled.       For questions or updates, please contact [place] Please consult [email or web address] for contact info under        Signed, Kato Wieczorek [place], MD  08/20/2019, 7:31 AM

## 2019-08-20 NOTE — Progress Notes (Signed)
ANTICOAGULATION CONSULT NOTE:  Follow Up  Pharmacy Consult for Heparin Indication: chest pain/ACS  Allergies  Allergen Reactions  . Chlorthalidone Other (See Comments)    Causes dehydration, kidneys shut down    Patient Measurements: Height: 5\' 9"  (175.3 cm) Weight: (!) 306 lb (138.8 kg) IBW/kg (Calculated) : 70.7 Heparin Dosing Weight: 104.6 kg   Vital Signs: Temp: 98.2 F (36.8 C) (09/07 0551) Temp Source: Oral (09/07 0551) BP: 143/95 (09/07 0551) Pulse Rate: 75 (09/07 0551)  Labs: Recent Labs    08/18/19 0221 08/19/19 0607 08/20/19 0253  HGB 12.5* 14.1 14.2  HCT 38.2* 44.1 44.0  PLT 170 198 184  HEPARINUNFRC 0.48 0.46 0.42  CREATININE 2.68* 2.65* 3.30*    Estimated Creatinine Clearance: 33 mL/min (A) (by C-G formula based on SCr of 3.3 mg/dL (H)).   Assessment: 63 yr old male presenting with intermittent chest pain for the last 7-10 days and increased lower leg edema. PMH includes long-standing CAD with multiple PCIs, HTN, DM2, obesity, HDL, and CKD. No anticoagulation PTA. Cath planned for Tuesday per cardiology.   Per chart, patient does have ecchymosis on R arm, but H&H is stable at 14.2/44.0 and plts are wnl. Heparin level is therapeutic at 0.42. It appears IV access was lost and patient now has a peripheral line and heparin was restarting around 1215.   Goal of Therapy:  Heparin level 0.3-0.7 units/ml Monitor platelets by anticoagulation protocol: Yes   Plan:  Continue heparin at 1100 units/ml Heparin level and CBC daily Monitor for signs/sx of bleeding   Thanks for allowing pharmacy to be a part of this patient's care.   Eddie Candle, PharmD PGY-1 Pharmacy Resident   Please check amion for clinical pharmacist contact number

## 2019-08-21 ENCOUNTER — Telehealth: Payer: Self-pay | Admitting: Orthopedic Surgery

## 2019-08-21 LAB — CBC WITH DIFFERENTIAL/PLATELET
Abs Immature Granulocytes: 0.07 10*3/uL (ref 0.00–0.07)
Basophils Absolute: 0 10*3/uL (ref 0.0–0.1)
Basophils Relative: 1 %
Eosinophils Absolute: 0.1 10*3/uL (ref 0.0–0.5)
Eosinophils Relative: 1 %
HCT: 41.3 % (ref 39.0–52.0)
Hemoglobin: 13.1 g/dL (ref 13.0–17.0)
Immature Granulocytes: 1 %
Lymphocytes Relative: 23 %
Lymphs Abs: 1.9 10*3/uL (ref 0.7–4.0)
MCH: 27.3 pg (ref 26.0–34.0)
MCHC: 31.7 g/dL (ref 30.0–36.0)
MCV: 86 fL (ref 80.0–100.0)
Monocytes Absolute: 1 10*3/uL (ref 0.1–1.0)
Monocytes Relative: 12 %
Neutro Abs: 5.3 10*3/uL (ref 1.7–7.7)
Neutrophils Relative %: 62 %
Platelets: 163 10*3/uL (ref 150–400)
RBC: 4.8 MIL/uL (ref 4.22–5.81)
RDW: 15.8 % — ABNORMAL HIGH (ref 11.5–15.5)
WBC: 8.4 10*3/uL (ref 4.0–10.5)
nRBC: 0 % (ref 0.0–0.2)

## 2019-08-21 LAB — GLUCOSE, CAPILLARY
Glucose-Capillary: 85 mg/dL (ref 70–99)
Glucose-Capillary: 199 mg/dL — ABNORMAL HIGH (ref 70–99)
Glucose-Capillary: 85 mg/dL (ref 70–99)
Glucose-Capillary: 84 mg/dL (ref 70–99)

## 2019-08-21 LAB — BASIC METABOLIC PANEL
Anion gap: 11 (ref 5–15)
BUN: 44 mg/dL — ABNORMAL HIGH (ref 6–20)
CO2: 22 mmol/L (ref 22–32)
Calcium: 9.2 mg/dL (ref 8.9–10.3)
Chloride: 105 mmol/L (ref 98–111)
Creatinine, Ser: 3.1 mg/dL — ABNORMAL HIGH (ref 0.61–1.24)
GFR calc Af Amer: 24 mL/min — ABNORMAL LOW (ref >60)
GFR calc non Af Amer: 21 mL/min — ABNORMAL LOW (ref >60)
Glucose, Bld: 83 mg/dL (ref 70–99)
Potassium: 4.2 mmol/L (ref 3.5–5.1)
Sodium: 138 mmol/L (ref 135–145)

## 2019-08-21 LAB — HEPARIN LEVEL (UNFRACTIONATED): Heparin Unfractionated: 0.45 IU/mL (ref 0.30–0.70)

## 2019-08-21 MED ORDER — SODIUM CHLORIDE 0.9% FLUSH: 3.0000 mL | Freq: Two times a day (BID) | INTRAVENOUS | Status: DC

## 2019-08-21 MED ORDER — SODIUM CHLORIDE 0.9 % IV SOLN
INTRAVENOUS | Status: DC
  Administered 2019-08-22: 09:00:00 via INTRAVENOUS

## 2019-08-21 MED ORDER — SODIUM CHLORIDE 0.9% FLUSH: 3.0000 mL | INTRAVENOUS | Status: DC | PRN

## 2019-08-21 MED ORDER — ASPIRIN 81 MG PO CHEW
81.0000 mg | CHEWABLE_TABLET | ORAL | Status: AC
  Administered 2019-08-22: 81 mg via ORAL
  Filled 2019-08-21: qty 1

## 2019-08-21 MED ORDER — SODIUM CHLORIDE 0.9 % IV SOLN: 250.0000 mL | INTRAVENOUS | Status: DC | PRN

## 2019-08-21 NOTE — H&P (View-Only) (Signed)
Progress Note  Patient Name: Joshua [name] Date of Encounter: 08/21/2019  Primary Cardiologist: [name], MD   Subjective   Currently CP free. No dyspnea. Frustrated that he has to wait another day for cath.   Inpatient Medications    Scheduled Meds: . allopurinol  300 mg Oral Daily  . amLODipine  10 mg Oral Daily  . aspirin EC  81 mg Oral Daily  . insulin aspart  0-15 Units Subcutaneous TID WC  . insulin aspart  6 Units Subcutaneous TID WC  . insulin glargine  35 Units Subcutaneous BID  . isosorbide mononitrate  120 mg Oral BID  . metoprolol succinate  150 mg Oral Daily  . pantoprazole  40 mg Oral BID AC  . pneumococcal 23 valent vaccine  0.5 mL Intramuscular Tomorrow-1000  . rosuvastatin  40 mg Oral Daily  . sodium chloride flush  3 mL Intravenous Q12H  . sodium chloride flush  3 mL Intravenous Q12H  . ticagrelor  90 mg Oral BID   Continuous Infusions: . sodium chloride 1 mL/kg/hr (08/21/19 0217)  . heparin 1,100 Units/hr (08/21/19 0600)   PRN Meds: acetaminophen **OR** acetaminophen, albuterol, nitroGLYCERIN, ondansetron **OR** ondansetron (ZOFRAN) IV, senna-docusate   Vital Signs    Vitals:   08/20/19 2122 08/21/19 0420 08/21/19 0423 08/21/19 0710  BP: 127/74 (!) 149/90  136/81  Pulse: 63 66    Resp: 16 18    Temp: 98 F (36.7 C) 98.4 F (36.9 C)    TempSrc: Oral Oral    SpO2: 97% 97%    Weight:   (!) 140.5 kg   Height:        Intake/Output Summary (Last 24 hours) at 08/21/2019 0806 Last data filed at 08/21/2019 0700 Gross per 24 hour  Intake 1594.37 ml  Output 1925 ml  Net -330.63 ml   Last 3 Weights 08/21/2019 08/20/2019 08/19/2019  Weight (lbs) 309 lb 11.9 oz 306 lb 305 lb 14.4 oz  Weight (kg) 140.5 kg 138.801 kg 138.755 kg      Telemetry    NSR 70s  - Personally Reviewed  ECG    No new EKGs to review today - Personally Reviewed  Physical Exam   GEN: morbidly obese, middle aged AAM, in no acute distress.   Neck: No JVD Cardiac: RRR, no  murmurs, rubs, or gallops.  Respiratory: Clear to auscultation bilaterally. GI: obese abdomen, soft, nontender, non-distended  MS: No edema; No deformity. Neuro:  Nonfocal  Psych: Normal affect   Labs    High Sensitivity Troponin:   Recent Labs  Lab 08/16/19 1158 08/16/19 1411  TROPONINIHS 12 14      Chemistry Recent Labs  Lab 08/17/19 0147 08/17/19 0402  08/19/19 0607 08/20/19 0253 08/21/19 0441  NA 138  --    < > 140 136 138  K 3.6  --    < > 3.2* 3.9 4.2  CL 106  --    < > 101 101 105  CO2 19*  --    < > $R'25 23 22  'RP$ GLUCOSE 215*  --    < > 122* 231* 83  BUN 46*  --    < > 37* 43* 44*  CREATININE 2.80*  --    < > 2.65* 3.30* 3.10*  CALCIUM 8.7*  --    < > 9.7 9.2 9.2  PROT 7.3  --   --   --   --   --   ALBUMIN 3.3*  --   --   --   --   --  AST 21  --   --   --   --   --   ALT QUANTITY NOT SUFFICIENT, UNABLE TO PERFORM TEST 17  --   --   --   --   ALKPHOS 112  --   --   --   --   --   BILITOT QUANTITY NOT SUFFICIENT, UNABLE TO PERFORM TEST 0.5  --   --   --   --   GFRNONAA 23*  --    < > 25* 19* 21*  GFRAA 27*  --    < > 29* 22* 24*  ANIONGAP 13  --    < > $R'14 12 11   'Ho$ < > = values in this interval not displayed.     Hematology Recent Labs  Lab 08/19/19 0607 08/20/19 0253 08/21/19 0441  WBC 8.3 8.3 8.4  RBC 5.11 5.15 4.80  HGB 14.1 14.2 13.1  HCT 44.1 44.0 41.3  MCV 86.3 85.4 86.0  MCH 27.6 27.6 27.3  MCHC 32.0 32.3 31.7  RDW 16.1* 15.9* 15.8*  PLT 198 184 163    BNP Recent Labs  Lab 08/16/19 2133  BNP 81.1     DDimer No results for input(s): DDIMER in the last 168 hours.   Radiology    No results found.  Cardiac Studies   Echo 04/07/2019 IMPRESSIONS  1. The left ventricle has normal systolic function, with an ejection fraction of 55-60%. The cavity size was normal. Indeterminate diastolic filling due to E-A fusion. 2. LV regional wall motion abnormalities could not be completely assessed due to suboptimal acoustic windows. Grossly  normal wall motion. 3. The inferior vena cava was dilated in size with <50% respiratory variability.  CORONARY STENT INTERVENTION4/27/2020  LEFT HEART CATH AND CORONARY ANGIOGRAPHY  Conclusion     Previously placed Mid LAD drug eluting stent is widely patent.  Ost 1st Diag to 1st Diag lesion is 40% stenosed.  Dist LAD lesion is 10% stenosed.  Previously placed 2nd Mrg drug eluting stent is widely patent.  3rd Mrg lesion is 30% stenosed.  Mid Cx lesion is 50% stenosed.  RPDA lesion is 30% stenosed.  Prox RCA to Mid RCA lesion is 40% stenosed.  Mid RCA lesion is 25% stenosed.  LV end diastolic pressure is normal.  There is no aortic valve stenosis.  Ost LAD to Prox LAD lesion is 90% stenosed. In stent restenosis.  Scoring balloon angioplasty was performed using a BALLOON WOLVERINE 3.75X10, followed by 4.0 [place] balloon.  Prox LAD lesion is 90% stenosed.  Post intervention, there is a 0% residual stenosis.     Patient Profile     63 y.o. male with a hx of long-standing CAD with multiple PCIs, intermittent medication noncompliance,HTN, DM2, obesity, HDL, CKD, Sleep apnea, morbid obesitywho is being seen  for the evaluation ofchest pain. Also w/ acute on chronic renal failure.   Assessment & Plan    1. CAD w/ Unstable Angina: long standing CAD w/ history of multiple coronary PCIs. S/p PCI with stenting of the RCA, OM, and LAD. The proximal to mid LAD was stented in 2012. In 2015 he had stenting of the proximal LAD overlapping the prior stent. In April of this year he had unstable angina and recurrent stenosis in the proximal LAD treated with cutting balloon PCI. Admitted 9/3 for symptoms of accelerating CP. Initially worse with exertion w/ progression to resting symptoms. Ruled out for acute MI. Hs Troponin 12>>14. Admit EKG w/o acute ST  changes.   Currently CP free on IV heparin   Continue DAPT w/ ASA + Brilinta, statin,  blocker, LA nitrate and CCB  If  recurrent pain, start IV nitro  Plan is for repeat LHC +/- PCI 9/9. Will need nephrology input prior given abnormal renal function. Other option would be NST to r/o high areas of ischemia prior to committing to cath  If LHC, no LV gram to limit contrast   Keep BP controlled  2. HTN: controlled on current regimen. BP goal < 130/80  Amlodipine 10  Imdur 120 BID  Metoprolol succinate 150 daily  3. T2DM: hgb A1c goal <7.0, BP goal < 130/80, LDL goal < 70 mg/dL  Poorly controlled w/ recent Hgb A1c at 10.2.   On insulin  Given concomitant  heart disease, consider addition of a SGLT2i if renal function improves (GFR currently < 30).  Continue statin therapy   Would benefit from weight loss   Management per primary   4. HLD: Current LDL 71 mg/dL  Given extent of CAD and multiple PCIs, consider more aggressive LDL reduction and lower target to < 50 mg/dL  Continue Crestor 40 mg nightly  Consider addition of a PCSK9i   5. Acute on Chronic Renal Failure: baseline SCr ~2. 3.03 on admit. 3.10 today. GFR 24 mL/min  Has required IV Lasix for volume overload  K stable at 4.2 Plan is for possible LHC tomorrow. No LV gram Will need nephrology to see to assess risk prior to cath Continue to avoid nephrotoxic agents  6. Chronic Diastolic CHF w/ Edema and Weight Gain: presented w/ significant increase in edema and weight gain. Weight was 304 lb in April and increased to 329 lb. Also with worsening renal function w/ Scr at 3.0. UA + for proteinuria. Likely diabetic nephropathy. Not on an ACE/ARB prior to admit. BNP only 81 on admit but may have been falsely low due to morbid obesity. CXR also did not show any edema. Weight has improved w/ IV Lasix, back to baseline but SCr remains unchanged. Difficult to assess volume given morbid obesity. ? If volume overload is primarily cardiac vs renal. He would benefit from [place].    For questions or updates, please contact [place] Please consult  [email or web address] for contact info under       Signed, Lyda Jester, PA-C  08/21/2019, 8:06 AM    History and all data above reviewed.  Patient examined.  I agree with the findings as above.  No pain.  No SOB. The patient exam reveals COR:RRR  ,  Lungs: Clear  ,  Abd: Positive bowel sounds, no rebound no guarding, Ext No edema  .  All available labs, radiology testing, previous records reviewed. Agree with documented assessment and plan. UNSTABLE ANGINA:  Cath in AM but would like nephrology input first since his creat is not coming down.  CHRONIC DIASTOLIC HF:  Check right heart cath.  CKD:  Nephrology will be consulted.   Jeneen Rinks Khiley Lieser  10:44 AM  08/21/2019

## 2019-08-21 NOTE — Telephone Encounter (Signed)
Can you please call pt and make an appt for next week with erin? unfortunately insurance will require a recent office note with in the year in order to honor the rx.

## 2019-08-21 NOTE — Progress Notes (Addendum)
Progress Note  Patient Name: Joshua [name] Date of Encounter: 08/21/2019  Primary Cardiologist: [name], MD   Subjective   Currently CP free. No dyspnea. Frustrated that he has to wait another day for cath.   Inpatient Medications    Scheduled Meds: . allopurinol  300 mg Oral Daily  . amLODipine  10 mg Oral Daily  . aspirin EC  81 mg Oral Daily  . insulin aspart  0-15 Units Subcutaneous TID WC  . insulin aspart  6 Units Subcutaneous TID WC  . insulin glargine  35 Units Subcutaneous BID  . isosorbide mononitrate  120 mg Oral BID  . metoprolol succinate  150 mg Oral Daily  . pantoprazole  40 mg Oral BID AC  . pneumococcal 23 valent vaccine  0.5 mL Intramuscular Tomorrow-1000  . rosuvastatin  40 mg Oral Daily  . sodium chloride flush  3 mL Intravenous Q12H  . sodium chloride flush  3 mL Intravenous Q12H  . ticagrelor  90 mg Oral BID   Continuous Infusions: . sodium chloride 1 mL/kg/hr (08/21/19 0217)  . heparin 1,100 Units/hr (08/21/19 0600)   PRN Meds: acetaminophen **OR** acetaminophen, albuterol, nitroGLYCERIN, ondansetron **OR** ondansetron (ZOFRAN) IV, senna-docusate   Vital Signs    Vitals:   08/20/19 2122 08/21/19 0420 08/21/19 0423 08/21/19 0710  BP: 127/74 (!) 149/90  136/81  Pulse: 63 66    Resp: 16 18    Temp: 98 F (36.7 C) 98.4 F (36.9 C)    TempSrc: Oral Oral    SpO2: 97% 97%    Weight:   (!) 140.5 kg   Height:        Intake/Output Summary (Last 24 hours) at 08/21/2019 0806 Last data filed at 08/21/2019 0700 Gross per 24 hour  Intake 1594.37 ml  Output 1925 ml  Net -330.63 ml   Last 3 Weights 08/21/2019 08/20/2019 08/19/2019  Weight (lbs) 309 lb 11.9 oz 306 lb 305 lb 14.4 oz  Weight (kg) 140.5 kg 138.801 kg 138.755 kg      Telemetry    NSR 70s  - Personally Reviewed  ECG    No new EKGs to review today - Personally Reviewed  Physical Exam   GEN: morbidly obese, middle aged AAM, in no acute distress.   Neck: No JVD Cardiac: RRR, no  murmurs, rubs, or gallops.  Respiratory: Clear to auscultation bilaterally. GI: obese abdomen, soft, nontender, non-distended  MS: No edema; No deformity. Neuro:  Nonfocal  Psych: Normal affect   Labs    High Sensitivity Troponin:   Recent Labs  Lab 08/16/19 1158 08/16/19 1411  TROPONINIHS 12 14      Chemistry Recent Labs  Lab 08/17/19 0147 08/17/19 0402  08/19/19 0607 08/20/19 0253 08/21/19 0441  NA 138  --    < > 140 136 138  K 3.6  --    < > 3.2* 3.9 4.2  CL 106  --    < > 101 101 105  CO2 19*  --    < > $R'25 23 22  'TZ$ GLUCOSE 215*  --    < > 122* 231* 83  BUN 46*  --    < > 37* 43* 44*  CREATININE 2.80*  --    < > 2.65* 3.30* 3.10*  CALCIUM 8.7*  --    < > 9.7 9.2 9.2  PROT 7.3  --   --   --   --   --   ALBUMIN 3.3*  --   --   --   --   --  AST 21  --   --   --   --   --   ALT QUANTITY NOT SUFFICIENT, UNABLE TO PERFORM TEST 17  --   --   --   --   ALKPHOS 112  --   --   --   --   --   BILITOT QUANTITY NOT SUFFICIENT, UNABLE TO PERFORM TEST 0.5  --   --   --   --   GFRNONAA 23*  --    < > 25* 19* 21*  GFRAA 27*  --    < > 29* 22* 24*  ANIONGAP 13  --    < > $R'14 12 11   'HU$ < > = values in this interval not displayed.     Hematology Recent Labs  Lab 08/19/19 0607 08/20/19 0253 08/21/19 0441  WBC 8.3 8.3 8.4  RBC 5.11 5.15 4.80  HGB 14.1 14.2 13.1  HCT 44.1 44.0 41.3  MCV 86.3 85.4 86.0  MCH 27.6 27.6 27.3  MCHC 32.0 32.3 31.7  RDW 16.1* 15.9* 15.8*  PLT 198 184 163    BNP Recent Labs  Lab 08/16/19 2133  BNP 81.1     DDimer No results for input(s): DDIMER in the last 168 hours.   Radiology    No results found.  Cardiac Studies   Echo 04/07/2019 IMPRESSIONS  1. The left ventricle has normal systolic function, with an ejection fraction of 55-60%. The cavity size was normal. Indeterminate diastolic filling due to E-A fusion. 2. LV regional wall motion abnormalities could not be completely assessed due to suboptimal acoustic windows. Grossly  normal wall motion. 3. The inferior vena cava was dilated in size with <50% respiratory variability.  CORONARY STENT INTERVENTION4/27/2020  LEFT HEART CATH AND CORONARY ANGIOGRAPHY  Conclusion     Previously placed Mid LAD drug eluting stent is widely patent.  Ost 1st Diag to 1st Diag lesion is 40% stenosed.  Dist LAD lesion is 10% stenosed.  Previously placed 2nd Mrg drug eluting stent is widely patent.  3rd Mrg lesion is 30% stenosed.  Mid Cx lesion is 50% stenosed.  RPDA lesion is 30% stenosed.  Prox RCA to Mid RCA lesion is 40% stenosed.  Mid RCA lesion is 25% stenosed.  LV end diastolic pressure is normal.  There is no aortic valve stenosis.  Ost LAD to Prox LAD lesion is 90% stenosed. In stent restenosis.  Scoring balloon angioplasty was performed using a BALLOON WOLVERINE 3.75X10, followed by 4.0 [place] balloon.  Prox LAD lesion is 90% stenosed.  Post intervention, there is a 0% residual stenosis.     Patient Profile     63 y.o. male with a hx of long-standing CAD with multiple PCIs, intermittent medication noncompliance,HTN, DM2, obesity, HDL, CKD, Sleep apnea, morbid obesitywho is being seen  for the evaluation ofchest pain. Also w/ acute on chronic renal failure.   Assessment & Plan    1. CAD w/ Unstable Angina: long standing CAD w/ history of multiple coronary PCIs. S/p PCI with stenting of the RCA, OM, and LAD. The proximal to mid LAD was stented in 2012. In 2015 he had stenting of the proximal LAD overlapping the prior stent. In April of this year he had unstable angina and recurrent stenosis in the proximal LAD treated with cutting balloon PCI. Admitted 9/3 for symptoms of accelerating CP. Initially worse with exertion w/ progression to resting symptoms. Ruled out for acute MI. Hs Troponin 12>>14. Admit EKG w/o acute ST  changes.   Currently CP free on IV heparin   Continue DAPT w/ ASA + Brilinta, statin,  blocker, LA nitrate and CCB  If  recurrent pain, start IV nitro  Plan is for repeat LHC +/- PCI 9/9. Will need nephrology input prior given abnormal renal function. Other option would be NST to r/o high areas of ischemia prior to committing to cath  If LHC, no LV gram to limit contrast   Keep BP controlled  2. HTN: controlled on current regimen. BP goal < 130/80  Amlodipine 10  Imdur 120 BID  Metoprolol succinate 150 daily  3. T2DM: hgb A1c goal <7.0, BP goal < 130/80, LDL goal < 70 mg/dL  Poorly controlled w/ recent Hgb A1c at 10.2.   On insulin  Given concomitant  heart disease, consider addition of a SGLT2i if renal function improves (GFR currently < 30).  Continue statin therapy   Would benefit from weight loss   Management per primary   4. HLD: Current LDL 71 mg/dL  Given extent of CAD and multiple PCIs, consider more aggressive LDL reduction and lower target to < 50 mg/dL  Continue Crestor 40 mg nightly  Consider addition of a PCSK9i   5. Acute on Chronic Renal Failure: baseline SCr ~2. 3.03 on admit. 3.10 today. GFR 24 mL/min  Has required IV Lasix for volume overload  K stable at 4.2 Plan is for possible LHC tomorrow. No LV gram Will need nephrology to see to assess risk prior to cath Continue to avoid nephrotoxic agents  6. Chronic Diastolic CHF w/ Edema and Weight Gain: presented w/ significant increase in edema and weight gain. Weight was 304 lb in April and increased to 329 lb. Also with worsening renal function w/ Scr at 3.0. UA + for proteinuria. Likely diabetic nephropathy. Not on an ACE/ARB prior to admit. BNP only 81 on admit but may have been falsely low due to morbid obesity. CXR also did not show any edema. Weight has improved w/ IV Lasix, back to baseline but SCr remains unchanged. Difficult to assess volume given morbid obesity. ? If volume overload is primarily cardiac vs renal. He would benefit from [place].    For questions or updates, please contact [place] Please consult  [email or web address] for contact info under       Signed, Lyda Jester, PA-C  08/21/2019, 8:06 AM    History and all data above reviewed.  Patient examined.  I agree with the findings as above.  No pain.  No SOB. The patient exam reveals COR:RRR  ,  Lungs: Clear  ,  Abd: Positive bowel sounds, no rebound no guarding, Ext No edema  .  All available labs, radiology testing, previous records reviewed. Agree with documented assessment and plan. UNSTABLE ANGINA:  Cath in AM but would like nephrology input first since his creat is not coming down.  CHRONIC DIASTOLIC HF:  Check right heart cath.  CKD:  Nephrology will be consulted.   Jeneen Rinks Rosalie Buenaventura  10:44 AM  08/21/2019

## 2019-08-21 NOTE — Progress Notes (Signed)
[name]  WPY:[id number] DOB: [birth year] DOA: 08/16/2019 PCP: [name], MD  Brief Narrative: Joshua [name] is a 63 y.o. male with medical history significant for extensive cardiac history, CAD with multiple PCI's, diabetes mellitus type 2, hypertension, hyperlipidemia, CKD, sleep apnea, morbid obesity, presents to the ED complaining of chest pain.  Patient reports intermittent left-sided chest pain for the past 10 days worsening over the past 2 days, describes it as pressure, nonradiating, 7/10 at its worse, minimally relieved with nitroglycerin with associated chronic shortness of breath, lower extremity edema and weight gain of about 25 pounds in the last couple of months.    Last catheterization was done in April for NSTEMI, which showed stent restenosis in the proximal LAD treated with cutting balloon PCI. Admitted with unstable angina and acute on chronic diastolic CHF  Assessment & Plan:   Unstable angina with extensive CAD hx s/p multiple stents -extensive cardiac history -Last cath-03/2019 showed Ost LAD to Prox LAD lesion 90% stenosed in-stent restenosis s/p balloon angioplasty -Cardiology following, continue IV heparin, aspirin, brillinta, metoprolol, crestor -Apparently according to patient and the nurses, plan for cath has been canceled today and it is rescheduled for tomorrow.  Acute on chronic diastolic HF -Last ECHO on 03/2019- EF of 55-60% Holding Lasix again today.  AKI on CKD stage 3 Baseline Cr around 2, but came in with 3.03 which has now improved and went down to only 2.65 only to rise again to 3.3 which has improved today to 3.1.  Avoiding nephrotoxic agents.  Hypokalemia: Resolved.  Uncontrolled Diabetes mellitus type 2 with hyperglycemia Blood sugar fairly controlled.  Continue Lantus 35 units twice daily and SSI.  HTN Stable Continue home regimen with metoprolol, amlodipine, imdur  HLD Continue crestor  GERD Continue PPI BID   Gout Stable Continue Allopurinol  OSA Continue cpap  Morbid obesity Counseling provided.  DVT prophylaxis: Heparin drip  Code Status: Full Family Communication: None at bedside Disposition Plan: Likely home after cath on Tuesday.  Consults : Cardiology     Procedures:   Antimicrobials:    Subjective: Seen and examined.  No complaint of chest pain or shortness of breath however he is very frustrated to the fact that he has to wait another day for the cath in addition to the weekend that he was here for.  Try to explain to him that he was here over the weekend due to limitations of the staffing due to having a long weekend but he continued to express his frustration.  Objective: Vitals:   08/20/19 2122 08/21/19 0420 08/21/19 0423 08/21/19 0710  BP: 127/74 (!) 149/90  136/81  Pulse: 63 66    Resp: 16 18    Temp: 98 F (36.7 C) 98.4 F (36.9 C)    TempSrc: Oral Oral    SpO2: 97% 97%    Weight:   (!) 140.5 kg   Height:        Intake/Output Summary (Last 24 hours) at 08/21/2019 1330 Last data filed at 08/21/2019 0800 Gross per 24 hour  Intake 1834.37 ml  Output 2125 ml  Net -290.63 ml   Filed Weights   08/19/19 0517 08/20/19 0551 08/21/19 0423  Weight: (!) 138.8 kg (!) 138.8 kg (!) 140.5 kg    Examination:  General exam: Appears calm and comfortable, morbidly obese. Respiratory system: Clear to auscultation. Respiratory effort normal. Cardiovascular system: S1 & S2 heard, RRR. No JVD, murmurs, rubs, gallops or clicks. No pedal  edema. Gastrointestinal system: Abdomen is nondistended, soft and nontender. No organomegaly or masses felt. Normal bowel sounds heard. Central nervous system: Alert and oriented. No focal neurological deficits. Extremities: Symmetric 5 x 5 power. Skin: No rashes, lesions or ulcers.  Psychiatry: Judgement and insight appear normal. Mood & affect appropriate.   Data Reviewed:   CBC: Recent Labs  Lab 08/17/19 0147 08/18/19  0221 08/19/19 0607 08/20/19 0253 08/21/19 0441  WBC 6.7 6.7 8.3 8.3 8.4  NEUTROABS 4.1 4.3 5.2 5.3 5.3  HGB 12.6* 12.5* 14.1 14.2 13.1  HCT 39.2 38.2* 44.1 44.0 41.3  MCV 84.8 84.9 86.3 85.4 86.0  PLT 164 170 198 184 811   Basic Metabolic Panel: Recent Labs  Lab 08/17/19 0147 08/18/19 0221 08/19/19 0607 08/20/19 0253 08/21/19 0441  NA 138 137 140 136 138  K 3.6 3.2* 3.2* 3.9 4.2  CL 106 104 101 101 105  CO2 19* $Remov'23 25 23 22  'kcsySr$ GLUCOSE 215* 211* 122* 231* 83  BUN 46* 44* 37* 43* 44*  CREATININE 2.80* 2.68* 2.65* 3.30* 3.10*  CALCIUM 8.7* 8.9 9.7 9.2 9.2   GFR: Estimated Creatinine Clearance: 35.3 mL/min (A) (by C-G formula based on SCr of 3.1 mg/dL (H)). Liver Function Tests: Recent Labs  Lab 08/17/19 0147 08/17/19 0402  AST 21  --   ALT QUANTITY NOT SUFFICIENT, UNABLE TO PERFORM TEST 17  ALKPHOS 112  --   BILITOT QUANTITY NOT SUFFICIENT, UNABLE TO PERFORM TEST 0.5  PROT 7.3  --   ALBUMIN 3.3*  --    No results for input(s): LIPASE, AMYLASE in the last 168 hours. No results for input(s): AMMONIA in the last 168 hours. Coagulation Profile: No results for input(s): INR, PROTIME in the last 168 hours. Cardiac Enzymes: No results for input(s): CKTOTAL, CKMB, CKMBINDEX, TROPONINI in the last 168 hours. BNP (last 3 results) No results for input(s): PROBNP in the last 8760 hours. HbA1C: No results for input(s): HGBA1C in the last 72 hours. CBG: Recent Labs  Lab 08/20/19 1153 08/20/19 1655 08/20/19 2125 08/21/19 0753 08/21/19 1121  GLUCAP 217* 184* 156* 85 199*   Lipid Profile: No results for input(s): CHOL, HDL, LDLCALC, TRIG, CHOLHDL, LDLDIRECT in the last 72 hours. Thyroid Function Tests: No results for input(s): TSH, T4TOTAL, FREET4, T3FREE, THYROIDAB in the last 72 hours. Anemia Panel: No results for input(s): VITAMINB12, FOLATE, FERRITIN, TIBC, IRON, RETICCTPCT in the last 72 hours. Urine analysis:    Component Value Date/Time   COLORURINE STRAW (A)  08/16/2019 2120   APPEARANCEUR CLEAR 08/16/2019 2120   LABSPEC 1.005 08/16/2019 2120   PHURINE 5.0 08/16/2019 2120   GLUCOSEU NEGATIVE 08/16/2019 2120   HGBUR SMALL (A) 08/16/2019 2120   BILIRUBINUR NEGATIVE 08/16/2019 2120   KETONESUR NEGATIVE 08/16/2019 2120   PROTEINUR 100 (A) 08/16/2019 2120   UROBILINOGEN 0.2 11/01/2011 1410   NITRITE NEGATIVE 08/16/2019 2120   LEUKOCYTESUR NEGATIVE 08/16/2019 2120   Sepsis Labs: $[email or web address]'[email or web address]$ (procalcitonin:4,lacticidven:4)  ) Recent Results (from the past 240 hour(s))  SARS Coronavirus 2 [facility] order, Performed in [facility] hospital lab) Nasopharyngeal Nasopharyngeal Swab     Status: None   Collection Time: 08/16/19  2:50 PM   Specimen: Nasopharyngeal Swab  Result Value Ref Range Status   SARS Coronavirus 2 NEGATIVE NEGATIVE Final    Comment: (NOTE) If result is NEGATIVE SARS-CoV-2 target nucleic acids are NOT DETECTED. The SARS-CoV-2 RNA is generally detectable in upper and lower  respiratory specimens during the acute phase of infection. The lowest  concentration  of SARS-CoV-2 viral copies this assay can detect is 250  copies / mL. A negative result does not preclude SARS-CoV-2 infection  and should not be used as the sole basis for treatment or other  patient management decisions.  A negative result may occur with  improper specimen collection / handling, submission of specimen other  than nasopharyngeal swab, presence of viral mutation(s) within the  areas targeted by this assay, and inadequate number of viral copies  (<250 copies / mL). A negative result must be combined with clinical  observations, patient history, and epidemiological information. If result is POSITIVE SARS-CoV-2 target nucleic acids are DETECTED. The SARS-CoV-2 RNA is generally detectable in upper and lower  respiratory specimens dur ing the acute phase of infection.  Positive  results are indicative of active infection with SARS-CoV-2.  Clinical  correlation  with patient history and other diagnostic information is  necessary to determine patient infection status.  Positive results do  not rule out bacterial infection or co-infection with other viruses. If result is PRESUMPTIVE POSTIVE SARS-CoV-2 nucleic acids MAY BE PRESENT.   A presumptive positive result was obtained on the submitted specimen  and confirmed on repeat testing.  While 2019 novel coronavirus  (SARS-CoV-2) nucleic acids may be present in the submitted sample  additional confirmatory testing may be necessary for epidemiological  and / or clinical management purposes  to differentiate between  SARS-CoV-2 and other Sarbecovirus currently known to infect humans.  If clinically indicated additional testing with an alternate test  methodology [phone]) is advised. The SARS-CoV-2 RNA is generally  detectable in upper and lower respiratory sp ecimens during the acute  phase of infection. The expected result is Negative. Fact Sheet for Patients:  StrictlyIdeas.no Fact Sheet for Healthcare Providers: BankingDealers.co.za This test is not yet approved or cleared by the [place] FDA and has been authorized for detection and/or diagnosis of SARS-CoV-2 by FDA under an Emergency Use Authorization (EUA).  This EUA will remain in effect (meaning this test can be used) for the duration of the COVID-19 declaration under Section 564(b)(1) of the Act, 21 U.S.C. section 360bbb-3(b)(1), unless the authorization is terminated or revoked sooner. Performed at [facility], [place] [address] [place], [place] [address]      Radiology Studies: No results found.   Scheduled Meds: . allopurinol  300 mg Oral Daily  . amLODipine  10 mg Oral Daily  . aspirin EC  81 mg Oral Daily  . insulin aspart  0-15 Units Subcutaneous TID WC  . insulin aspart  6 Units Subcutaneous TID WC  . insulin glargine  35 Units Subcutaneous BID  . isosorbide  mononitrate  120 mg Oral BID  . metoprolol succinate  150 mg Oral Daily  . pantoprazole  40 mg Oral BID AC  . pneumococcal 23 valent vaccine  0.5 mL Intramuscular Tomorrow-1000  . rosuvastatin  40 mg Oral Daily  . sodium chloride flush  3 mL Intravenous Q12H  . sodium chloride flush  3 mL Intravenous Q12H  . ticagrelor  90 mg Oral BID   Continuous Infusions: . sodium chloride Stopped (08/21/19 0847)  . heparin 1,100 Units/hr (08/21/19 0600)     LOS: 4 days   Time spent: 29 minutes  Darliss Cheney, MD [facility]  08/21/2019, 1:30 PM

## 2019-08-21 NOTE — Telephone Encounter (Signed)
Patient left a message requesting another RX for Prosthetic.  He received one back in 2017, but could not afford it at that time.  CB#[phone].  Thank you.

## 2019-08-21 NOTE — Consult Note (Signed)
[place] Date: 08/16/2019 08/21/2019 [place] Agent Requesting Physician:  Percival Spanish MD  Reason for Consult:  [place], [place], need for [facility] HPI:  63 M admitted 9/3 with progressive anginal chest pain and PND/orthopnea.  He has extensive cardiac history with several PCI procedures and known diastolic heart failure.  Admitted for unstable angina and placed on medical management.  Plan has been for left heart catheterization, tentatively for tomorrow.  Patient has established CKD 3 and creatinine has risen and we are asked to evaluate.  Patient's history also includes type 2 diabetes, hypertension, hyperlipidemia, OSA, morbid obesity.  He has a history of a TMA of the right foot for a nonhealing wound.  He follows in our office with [name].  Last seen by APP on 8/21 when his creatinine was 2.2, hemoglobin 12.7, and confirmed sub-nephrotic proteinuria.  Currently the patient feels improved, he had significant edema which has largely resolved.  Weight has declined by 10 kg.  No problems passing his water.  No use of nonsteroidals.  No contrast exposure.  Historical renal imaging without structural renal issues.  UA at time of admit without hematuria or pyuria, 3+ proteinuria.  Patient is currently chest pain-free.  He is aware of the risks of worsened kidney disease and potential dialysis from contrast exposure.  Diuretics were held on 9/6.  U OP yesterday nearly 2 L.  Trend in serum creatinine is as follows:   Creat (mg/dL)  Date Value  11/17/2016 2.06 (H)  11/01/2016 1.50 (H)  09/08/2016 1.89 (H)  02/16/2016 1.29  10/13/2015 1.84 (H)  11/29/2013 1.26  10/09/2013 1.36 (H)   Creatinine, Ser (mg/dL)  Date Value  08/21/2019 3.10 (H)  08/20/2019 3.30 (H)  08/19/2019 2.65 (H)  08/18/2019 2.68 (H)  08/17/2019 2.80 (H)  08/16/2019 3.03 (H)  04/10/2019 2.01 (H)  04/09/2019 1.94 (H)  04/07/2019 1.96 (H)  04/06/2019 2.01 (H)  ] I/Os: I/O last 3 completed shifts: In: 2195.4  [P.O.:480; I.V.:1715.4] Out: 3075 [Urine:3075]   ROS NSAIDS: no exposure IV Contrast no exposure TMP/SMX no exposure Hypotension no exposure Balance of 12 systems is negative w/ exceptions as above  PMH  Past Medical History:  Diagnosis Date  . Anemia   . Chronic combined systolic and diastolic CHF (congestive heart failure) ([place])    a. 07/2015 Echo: EF 45-50%, mod LVH, mid apical/antsept AK, Gr 1 DD.  [name] CKD (chronic kidney disease), stage III ([place])   . Coronary artery disease    a. 2012 s/p MI/PCI: s/p DES to mid/dist RCA and overlapping DES to LAD;  b. 07/2015 Cath: LAD 10% ISR, D1 40(jailed), LCX 63m OM2 30, OM3 30, RCA 563mSR, 10d ISR; c. 07/2016 NSTEMI/PCI: LAD 85ost/p/m (3.5x20 Synergy DES overlapping prior stent), D1 40, LCX 75107mM2 95 (2.75x16 Synergy DES), OM3 30, RCA 12m71md.  . Depression   . Diabetic gastroparesis (HCC)[place]. DKA (diabetic ketoacidoses) (HCC)[employer]/01/2018  . GERD (gastroesophageal reflux disease)   . Gout   . Heart murmur   . Hyperlipemia   . Hypertensive heart disease   . Ischemic cardiomyopathy    a. 07/2015 Echo: EF 45-50%, mod LVH, mid-apicalanteroseptal AK, Gr1 DD.  . Morbid obesity (HCC)[place]. OSA on CPAP   . Osteomyelitis (HCC)[place] a. 07/2016 MTP joint of R great toe s/p transmetatarsal amputation.  . Polyneuropathy in diabetes(357.2)   . Pulmonary sarcoidosis (HCC)[place] "problems w/it years ago; no problems anymore" (07/03/2014)  .  Rectal bleeding   . Sleep apnea   . Type II diabetes mellitus (HCC)    PSH  Past Surgical History:  Procedure Laterality Date  . AMPUTATION Right 07/24/2016   Procedure: TRANSMETATARSAL FOOT AMPUTATION;  Surgeon: [name], MD;  Location: [place];  Service: Orthopedics;  Laterality: Right;  . BALLOON DILATION N/A 03/21/2018   Procedure: BALLOON DILATION;  Surgeon: [name], MD;  Location: [facility] ENDOSCOPY;  Service: Endoscopy;  Laterality: N/A;  . BREAST LUMPECTOMY Right ~ 2000   "benign"  . CARDIAC  CATHETERIZATION N/A 07/30/2015   Procedure: Left Heart Cath and Coronary Angiography;  Surgeon: Burnell Blanks, MD;  Location: [place] CV LAB;  Service: Cardiovascular;  Laterality: N/A;  . CARDIAC CATHETERIZATION N/A 07/19/2016   Procedure: Left Heart Cath and Coronary Angiography;  Surgeon: Belva Crome, MD;  Location: [place] CV LAB;  Service: Cardiovascular;  Laterality: N/A;  . CARDIAC CATHETERIZATION N/A 07/29/2016   Procedure: Coronary Stent Intervention;  Surgeon: Belva Crome, MD;  Location: [place] CV LAB;  Service: Cardiovascular;  Laterality: N/A;  . CARDIAC CATHETERIZATION N/A 07/29/2016   Procedure: Intravascular Ultrasound/IVUS;  Surgeon: Belva Crome, MD;  Location: [place] CV LAB;  Service: Cardiovascular;  Laterality: N/A;  . CATARACT EXTRACTION W/ INTRAOCULAR LENS  IMPLANT, BILATERAL Bilateral 2014-2015  . COLONOSCOPY WITH PROPOFOL N/A 08/22/2017   Procedure: COLONOSCOPY WITH PROPOFOL;  Surgeon: [name], MD;  Location: [place];  Service: Endoscopy;  Laterality: N/A;  . CORONARY ANGIOPLASTY WITH STENT PLACEMENT  10/31/2011   30 % MID ATRIOVENTRICULAR GROOVE CX STENOSIS. 90 % MID RCA.PTA TO LAD/STENTING OF TANDEM 60-70%. LAD STENOSIS 99% REDUCED TO 0%.3.0 X 32MM PROMUS DES POSTDILATED TO 3.25MM.  [name] CORONARY ANGIOPLASTY WITH STENT PLACEMENT  07/03/2014   "2"  . CORONARY STENT INTERVENTION N/A 04/09/2019   Procedure: CORONARY STENT INTERVENTION;  Surgeon: Jettie Booze, MD;  Location: [place] CV LAB;  Service: Cardiovascular;  Laterality: N/A;  . ESOPHAGOGASTRODUODENOSCOPY (EGD) WITH PROPOFOL N/A 03/21/2018   Procedure: ESOPHAGOGASTRODUODENOSCOPY (EGD) WITH PROPOFOL;  Surgeon: [name], MD;  Location: [place];  Service: Endoscopy;  Laterality: N/A;  . I&D EXTREMITY Right 10/30/2013   Procedure: IRRIGATION AND DEBRIDEMENT RIGHT SMALL FINGER POSSIBLE SECONDARY WOUND CLOSURE;  Surgeon: [name], MD;  Location: [facility];   Service: Orthopedics;  Laterality: Right;  [name] is available after 4pm  . I&D EXTREMITY Right 11/01/2013   Procedure:  IRRIGATION AND DEBRIDEMENT RIGHT  PROXIMAL PHALANGEAL LEVEL AMPUTATION;  Surgeon: [name], MD;  Location: [facility];  Service: Orthopedics;  Laterality: Right;  . INCISION AND DRAINAGE Right 10/28/2013   Procedure: INCISION AND DRAINAGE Right small finger;  Surgeon: [name], MD;  Location: [facility];  Service: Orthopedics;  Laterality: Right;  . LEFT HEART CATH Left 11/03/2011   Procedure: LEFT HEART CATH;  Surgeon: [name], MD;  Location: [facility] CATH LAB;  Service: Cardiovascular;  Laterality: Left;  . LEFT HEART CATH AND CORONARY ANGIOGRAPHY N/A 04/09/2019   Procedure: LEFT HEART CATH AND CORONARY ANGIOGRAPHY;  Surgeon: Jettie Booze, MD;  Location: [place] CV LAB;  Service: Cardiovascular;  Laterality: N/A;  . LEFT HEART CATHETERIZATION WITH CORONARY ANGIOGRAM N/A 07/03/2014   Procedure: LEFT HEART CATHETERIZATION WITH CORONARY ANGIOGRAM;  Surgeon: [name], MD;  Location: [facility] CATH LAB;  Service: Cardiovascular;  Laterality: N/A;  . PERCUTANEOUS CORONARY STENT INTERVENTION (PCI-S) Left 11/03/2011   Procedure: PERCUTANEOUS CORONARY STENT INTERVENTION (PCI-S);  Surgeon: [name], MD;  Location: [facility] CATH LAB;  Service: Cardiovascular;  Laterality: Left;  . TOE AMPUTATION Right ~ 2010   "2nd toe"  . TOE AMPUTATION Right 2012   "3rd toe"   FH  Family History  Problem Relation Age of Onset  . Heart attack Maternal Aunt   . Diabetes Maternal Grandmother   . Hypertension Maternal Grandmother    SH  reports that he has never smoked. He has never used smokeless tobacco. He reports current alcohol use. He reports that he does not use drugs. Allergies  Allergies  Allergen Reactions  . Chlorthalidone Other (See Comments)    Causes dehydration, kidneys shut down   Home medications Prior to Admission medications   Medication Sig  Start Date End Date Taking? Authorizing Provider  acetaminophen (TYLENOL) 650 MG CR tablet Take 1,300 mg by mouth daily as needed for pain.   Yes [provider]  allopurinol (ZYLOPRIM) 300 MG tablet Take 300 mg by mouth daily.    Yes [provider]  amLODipine (NORVASC) 10 MG tablet Take 1 tablet (10 mg total) by mouth daily. 04/10/19  Yes Reino Bellis B, NP  aspirin EC 81 MG tablet Take 81 mg by mouth daily.    Yes [provider]  Camphor-Menthol-Methyl Sal (HM SALONPAS PAIN RELIEF EX) Apply 1 spray topically daily as needed (pain).   Yes [provider]  furosemide (LASIX) 40 MG tablet Take 1 tablet (40 mg total) by mouth daily. Patient taking differently: Take 40 mg by mouth 2 (two) times daily.  01/12/19  Yes Almyra Deforest, PA  hydrALAZINE (APRESOLINE) 50 MG tablet Take 1 tablet (50 mg total) by mouth 3 (three) times daily. 01/12/19  Yes Almyra Deforest, PA  insulin aspart (NOVOLOG) 100 UNIT/ML injection Inject 6 Units into the skin 3 (three) times daily with meals. Patient taking differently: Inject 10-12 Units into the skin 2 (two) times daily. Sliding scale 04/09/19  Yes Bhagat, Bhavinkumar, PA  insulin glargine (LANTUS) 100 UNIT/ML injection Inject 0.2 mLs (20 Units total) into the skin 2 (two) times daily. 04/09/19  Yes Bhagat, Bhavinkumar, PA  isosorbide mononitrate (IMDUR) 120 MG 24 hr tablet Take 1 tablet (120 mg total) by mouth 2 (two) times daily. 04/10/19  Yes Reino Bellis B, NP  metoprolol succinate (TOPROL XL) 100 MG 24 hr tablet Take 1 and 1/2 tablets by mouth daily. Patient taking differently: Take 150 mg by mouth daily.  01/12/19  Yes Almyra Deforest, PA  nitroGLYCERIN (NITROSTAT) 0.4 MG SL tablet Place 1 tablet (0.4 mg total) under the tongue every 5 (five) minutes as needed for chest pain. 01/12/19  Yes Almyra Deforest, PA  pantoprazole (PROTONIX) 40 MG tablet TAKE 1 TABLET BY MOUTH TWICE DAILY BEFORE MEAL(S) Patient taking differently: Take 40 mg by mouth  daily.  05/30/19  Yes [name], MD  rosuvastatin (CRESTOR) 40 MG tablet Take 1 tablet (40 mg total) by mouth daily. 01/12/19  Yes Almyra Deforest, PA  ticagrelor (BRILINTA) 90 MG TABS tablet Take 1 tablet (90 mg total) by mouth 2 (two) times daily. 01/12/19  Yes Meng, Isaac Laud, PA  Accu-Chek FastClix Lancets MISC USE UP TO FOUR TIMES DAILY AS DIRECTED 04/10/19   [provider]  ACCU-CHEK GUIDE test strip USE UP TO FOUR TIMES DAILY AS DIRECTED 04/10/19   [provider]  blood glucose meter kit and supplies Dispense based on patient and insurance preference. Use up to four times daily as directed. (FOR ICD-10  E10.9, E11.9). 04/09/19   Bhagat, Crista Luria, PA  silver sulfADIAZINE (SILVADENE) 1 % cream Apply pea-sized amount to wound daily. Patient not taking: Reported on 08/16/2019 05/31/19   Evelina Bucy, DPM  sulfamethoxazole-trimethoprim (BACTRIM DS) 800-160 MG tablet Take 1 tablet by mouth 2 (two) times daily. Patient not taking: Reported on 08/16/2019 05/31/19   Evelina Bucy, DPM    Current Medications Scheduled Meds: . allopurinol  300 mg Oral Daily  . amLODipine  10 mg Oral Daily  . aspirin EC  81 mg Oral Daily  . insulin aspart  0-15 Units Subcutaneous TID WC  . insulin aspart  6 Units Subcutaneous TID WC  . insulin glargine  35 Units Subcutaneous BID  . isosorbide mononitrate  120 mg Oral BID  . metoprolol succinate  150 mg Oral Daily  . pantoprazole  40 mg Oral BID AC  . pneumococcal 23 valent vaccine  0.5 mL Intramuscular Tomorrow-1000  . rosuvastatin  40 mg Oral Daily  . sodium chloride flush  3 mL Intravenous Q12H  . sodium chloride flush  3 mL Intravenous Q12H  . sodium chloride flush  3 mL Intravenous Q12H  . ticagrelor  90 mg Oral BID   Continuous Infusions: . sodium chloride Stopped (08/21/19 0847)  . heparin 1,100 Units/hr (08/21/19 0600)   PRN Meds:.acetaminophen **OR** acetaminophen, albuterol, nitroGLYCERIN, ondansetron **OR** ondansetron (ZOFRAN) IV,  senna-docusate  CBC Recent Labs  Lab 08/19/19 0607 08/20/19 0253 08/21/19 0441  WBC 8.3 8.3 8.4  NEUTROABS 5.2 5.3 5.3  HGB 14.1 14.2 13.1  HCT 44.1 44.0 41.3  MCV 86.3 85.4 86.0  PLT 198 184 668   Basic Metabolic Panel Recent Labs  Lab 08/16/19 1158 08/17/19 0147 08/18/19 0221 08/19/19 0607 08/20/19 0253 08/21/19 0441  NA 138 138 137 140 136 138  K 3.5 3.6 3.2* 3.2* 3.9 4.2  CL 103 106 104 101 101 105  CO2 20* 19* _0 GLUCOSE 264* 215* 211* 122* 231* 83  BUN 43* 46* 44* 37* 43* 44*  CREATININE 3.03* 2.80* 2.68* 2.65* 3.30* 3.10*  CALCIUM 8.9 8.7* 8.9 9.7 9.2 9.2    Physical Exam  Blood pressure 136/81, pulse 66, temperature 98.4 F (36.9 C), temperature source Oral, resp. rate 18, height _1  (1.753 m), weight (!) 140.5 kg, SpO2 97 %. GEN: Obese, NAD, sitting on edge of bed, pleasant ENT: NCAT EYES: EOMI CV: Regular, normal rate, normal S1 and S2 PULM: Clear bilaterally ABD: Soft, nontender  SKIN: No rashes or lesions EXT: Trace edema Nonfocal, CN II through XII grossly intact   Assessment 63 year old male with unstable angina, extensive CAD history with PCI procedures and diastolic heart failure exacerbation.  He has a of CKD 3.  Likely etiology of the acute renal injury would be related to his cardiac disease.  Doubt obstruction or active glomerular process.  1. Unstable angina: Plan is for left heart catheterization.  Patient is aware of short and long-term renal risk including renal injury requiring dialysis.  Diuretics have been on hold.  Given his tenuous volume status I would not give fluids overnight. 2. AO CKD 3: Largely as above, continue to trend creatinine and strict I's and O's.  Will follow along in the post catheterization setting. 3. Diastolic heart failure exacerbation: Appears improved, has diuresed well, 20 pounds less than admission weight. 4. DM2 5. Hypertension, BP stable  Plan 1. As above 2. Daily weights, Daily Renal Panel,  Strict I/Os, Avoid nephrotoxins (NSAIDs, judicious IV  Contrast)    Pearson Grippe MD 08/21/2019, 2:45 PM

## 2019-08-21 NOTE — Progress Notes (Signed)
ANTICOAGULATION CONSULT NOTE:  Follow Up  Pharmacy Consult for Heparin Indication: chest pain/ACS  Allergies  Allergen Reactions  . Chlorthalidone Other (See Comments)    Causes dehydration, kidneys shut down    Patient Measurements: Height: 5\' 9"  (175.3 cm) Weight: (!) 309 lb 11.9 oz (140.5 kg) IBW/kg (Calculated) : 70.7 Heparin Dosing Weight: 104.6 kg   Vital Signs: Temp: 98.4 F (36.9 C) (09/08 0420) Temp Source: Oral (09/08 0420) BP: 136/81 (09/08 0710) Pulse Rate: 66 (09/08 0420)  Labs: Recent Labs    08/19/19 0607 08/20/19 0253 08/21/19 0441  HGB 14.1 14.2 13.1  HCT 44.1 44.0 41.3  PLT 198 184 163  HEPARINUNFRC 0.46 0.42 0.45  CREATININE 2.65* 3.30* 3.10*    Estimated Creatinine Clearance: 35.3 mL/min (A) (by C-G formula based on SCr of 3.1 mg/dL (H)).   Assessment: 63 yr old male presenting with intermittent chest pain for the last 7-10 days and increased lower leg edema. PMH includes long-standing CAD with multiple PCIs, HTN, DM2, obesity, HDL, and CKD. No anticoagulation PTA. Cath planned for Tuesday per cardiology.   Heparin level at goal this morning, hgb down slightly to 13.1. No overt bleeding noted. Platelet count trending down but ok at 163. Scr is still elevated at 3.1, postponing cath until at least tomorrow to follow renal function.   Goal of Therapy:  Heparin level 0.3-0.7 units/ml Monitor platelets by anticoagulation protocol: Yes   Plan:  Continue heparin at 1100 units/ml Heparin level and CBC daily Monitor for signs/sx of bleeding   Thanks for allowing pharmacy to be a part of this patient's care.  Erin Hearing PharmD., BCPS Clinical Pharmacist 08/21/2019 10:52 AM  Please check amion for clinical pharmacist contact number

## 2019-08-22 ENCOUNTER — Encounter (HOSPITAL_COMMUNITY): Payer: Self-pay | Admitting: Cardiology

## 2019-08-22 ENCOUNTER — Encounter (HOSPITAL_COMMUNITY)
Admission: EM | Disposition: A | Discharge status: Home / Self Care | Payer: Self-pay | Source: Home / Self Care | Attending: Family Medicine

## 2019-08-22 DIAGNOSIS — I25110 Atherosclerotic heart disease of native coronary artery with unstable angina pectoris: Secondary | ICD-10-CM

## 2019-08-22 HISTORY — PX: RIGHT/LEFT HEART CATH AND CORONARY ANGIOGRAPHY: CATH118266

## 2019-08-22 HISTORY — PX: CORONARY STENT INTERVENTION: CATH118234

## 2019-08-22 LAB — POCT I-STAT EG7
Acid-base deficit: 2 mmol/L (ref 0.0–2.0)
Bicarbonate: 23.5 mmol/L (ref 20.0–28.0)
Calcium, Ion: 1.26 mmol/L (ref 1.15–1.40)
HCT: 37 % — ABNORMAL LOW (ref 39.0–52.0)
Hemoglobin: 12.6 g/dL — ABNORMAL LOW (ref 13.0–17.0)
O2 Saturation: 70 %
Potassium: 3.3 mmol/L — ABNORMAL LOW (ref 3.5–5.1)
Sodium: 143 mmol/L (ref 135–145)
TCO2: 25 mmol/L (ref 22–32)
pCO2, Ven: 42.6 mmHg — ABNORMAL LOW (ref 44.0–60.0)
pH, Ven: 7.35 (ref 7.250–7.430)
pO2, Ven: 39 mmHg (ref 32.0–45.0)

## 2019-08-22 LAB — POCT I-STAT 7, (LYTES, BLD GAS, ICA,H+H)
Acid-base deficit: 3 mmol/L — ABNORMAL HIGH (ref 0.0–2.0)
Bicarbonate: 22.1 mmol/L (ref 20.0–28.0)
Calcium, Ion: 1.23 mmol/L (ref 1.15–1.40)
HCT: 37 % — ABNORMAL LOW (ref 39.0–52.0)
Hemoglobin: 12.6 g/dL — ABNORMAL LOW (ref 13.0–17.0)
O2 Saturation: 98 %
Potassium: 3.3 mmol/L — ABNORMAL LOW (ref 3.5–5.1)
Sodium: 143 mmol/L (ref 135–145)
TCO2: 23 mmol/L (ref 22–32)
pCO2 arterial: 37.8 mmHg (ref 32.0–48.0)
pH, Arterial: 7.374 (ref 7.350–7.450)
pO2, Arterial: 103 mmHg (ref 83.0–108.0)

## 2019-08-22 LAB — CBC WITH DIFFERENTIAL/PLATELET
Abs Immature Granulocytes: 0.06 10*3/uL (ref 0.00–0.07)
Basophils Absolute: 0 10*3/uL (ref 0.0–0.1)
Basophils Relative: 0 %
Eosinophils Absolute: 0.1 10*3/uL (ref 0.0–0.5)
Eosinophils Relative: 1 %
HCT: 39.6 % (ref 39.0–52.0)
Hemoglobin: 12.6 g/dL — ABNORMAL LOW (ref 13.0–17.0)
Immature Granulocytes: 1 %
Lymphocytes Relative: 28 %
Lymphs Abs: 2.5 10*3/uL (ref 0.7–4.0)
MCH: 27.5 pg (ref 26.0–34.0)
MCHC: 31.8 g/dL (ref 30.0–36.0)
MCV: 86.3 fL (ref 80.0–100.0)
Monocytes Absolute: 1.1 10*3/uL — ABNORMAL HIGH (ref 0.1–1.0)
Monocytes Relative: 12 %
Neutro Abs: 5.2 10*3/uL (ref 1.7–7.7)
Neutrophils Relative %: 58 %
Platelets: 175 10*3/uL (ref 150–400)
RBC: 4.59 MIL/uL (ref 4.22–5.81)
RDW: 16.1 % — ABNORMAL HIGH (ref 11.5–15.5)
WBC: 9.1 10*3/uL (ref 4.0–10.5)
nRBC: 0 % (ref 0.0–0.2)

## 2019-08-22 LAB — BASIC METABOLIC PANEL
Anion gap: 12 (ref 5–15)
BUN: 38 mg/dL — ABNORMAL HIGH (ref 6–20)
CO2: 21 mmol/L — ABNORMAL LOW (ref 22–32)
Calcium: 9.1 mg/dL (ref 8.9–10.3)
Chloride: 107 mmol/L (ref 98–111)
Creatinine, Ser: 3.01 mg/dL — ABNORMAL HIGH (ref 0.61–1.24)
GFR calc Af Amer: 25 mL/min — ABNORMAL LOW (ref >60)
GFR calc non Af Amer: 21 mL/min — ABNORMAL LOW (ref >60)
Glucose, Bld: 54 mg/dL — ABNORMAL LOW (ref 70–99)
Potassium: 3.4 mmol/L — ABNORMAL LOW (ref 3.5–5.1)
Sodium: 140 mmol/L (ref 135–145)

## 2019-08-22 LAB — GLUCOSE, CAPILLARY
Glucose-Capillary: 57 mg/dL — ABNORMAL LOW (ref 70–99)
Glucose-Capillary: 99 mg/dL (ref 70–99)
Glucose-Capillary: 76 mg/dL (ref 70–99)
Glucose-Capillary: 125 mg/dL — ABNORMAL HIGH (ref 70–99)
Glucose-Capillary: 106 mg/dL — ABNORMAL HIGH (ref 70–99)
Glucose-Capillary: 83 mg/dL (ref 70–99)
Glucose-Capillary: 53 mg/dL — ABNORMAL LOW (ref 70–99)
Glucose-Capillary: 73 mg/dL (ref 70–99)
Glucose-Capillary: 152 mg/dL — ABNORMAL HIGH (ref 70–99)

## 2019-08-22 LAB — HEPARIN LEVEL (UNFRACTIONATED): Heparin Unfractionated: 0.37 IU/mL (ref 0.30–0.70)

## 2019-08-22 LAB — POCT ACTIVATED CLOTTING TIME: Activated Clotting Time: 345 seconds

## 2019-08-22 SURGERY — RIGHT/LEFT HEART CATH AND CORONARY ANGIOGRAPHY
Anesthesia: LOCAL

## 2019-08-22 MED ORDER — SODIUM CHLORIDE 0.9 % IV SOLN: INTRAVENOUS | Status: AC

## 2019-08-22 MED ORDER — LIDOCAINE HCL (PF) 1 % IJ SOLN
INTRAMUSCULAR | Status: AC
  Filled 2019-08-22: qty 30

## 2019-08-22 MED ORDER — FENTANYL CITRATE (PF) 100 MCG/2ML IJ SOLN
INTRAMUSCULAR | Status: AC
  Filled 2019-08-22: qty 2

## 2019-08-22 MED ORDER — VERAPAMIL HCL 2.5 MG/ML IV SOLN
INTRAVENOUS | Status: AC
  Filled 2019-08-22: qty 2

## 2019-08-22 MED ORDER — SODIUM CHLORIDE 0.9% FLUSH: 3.0000 mL | INTRAVENOUS | Status: DC | PRN

## 2019-08-22 MED ORDER — DEXTROSE 50 % IV SOLN
INTRAVENOUS | Status: AC
  Administered 2019-08-22: 25 mL
  Filled 2019-08-22: qty 50

## 2019-08-22 MED ORDER — HEPARIN SODIUM (PORCINE) 1000 UNIT/ML IJ SOLN
INTRAMUSCULAR | Status: DC | PRN
  Administered 2019-08-22: 6000 [IU] via INTRAVENOUS
  Administered 2019-08-22: 7000 [IU] via INTRAVENOUS

## 2019-08-22 MED ORDER — SODIUM CHLORIDE 0.9% FLUSH: 3.0000 mL | Freq: Two times a day (BID) | INTRAVENOUS | Status: DC

## 2019-08-22 MED ORDER — LIDOCAINE HCL (PF) 1 % IJ SOLN
INTRAMUSCULAR | Status: DC | PRN
  Administered 2019-08-22: 5 mL

## 2019-08-22 MED ORDER — INSULIN GLARGINE 100 UNIT/ML ~~LOC~~ SOLN
20.0000 [IU] | Freq: Two times a day (BID) | SUBCUTANEOUS | Status: DC
  Administered 2019-08-22 – 2019-08-23 (×2): 20 [IU] via SUBCUTANEOUS
  Filled 2019-08-22 (×6): qty 0.2

## 2019-08-22 MED ORDER — HEPARIN SODIUM (PORCINE) 5000 UNIT/ML IJ SOLN
5000.0000 [IU] | Freq: Three times a day (TID) | INTRAMUSCULAR | Status: DC
  Administered 2019-08-23: 5000 [IU] via SUBCUTANEOUS
  Filled 2019-08-22: qty 1

## 2019-08-22 MED ORDER — FENTANYL CITRATE (PF) 100 MCG/2ML IJ SOLN
INTRAMUSCULAR | Status: DC | PRN
  Administered 2019-08-22: 25 ug via INTRAVENOUS

## 2019-08-22 MED ORDER — HEPARIN SODIUM (PORCINE) 1000 UNIT/ML IJ SOLN
INTRAMUSCULAR | Status: AC
  Filled 2019-08-22: qty 1

## 2019-08-22 MED ORDER — HEPARIN (PORCINE) IN NACL 1000-0.9 UT/500ML-% IV SOLN
INTRAVENOUS | Status: AC
  Filled 2019-08-22: qty 1000

## 2019-08-22 MED ORDER — SODIUM CHLORIDE 0.9 % IV SOLN: 250.0000 mL | INTRAVENOUS | Status: DC | PRN

## 2019-08-22 MED ORDER — VERAPAMIL HCL 2.5 MG/ML IV SOLN
INTRAVENOUS | Status: DC | PRN
  Administered 2019-08-22: 10 mL via INTRA_ARTERIAL

## 2019-08-22 MED ORDER — MIDAZOLAM HCL 2 MG/2ML IJ SOLN
INTRAMUSCULAR | Status: AC
  Filled 2019-08-22: qty 2

## 2019-08-22 MED ORDER — HEPARIN (PORCINE) IN NACL 1000-0.9 UT/500ML-% IV SOLN
INTRAVENOUS | Status: DC | PRN
  Administered 2019-08-22 (×2): 500 mL

## 2019-08-22 MED ORDER — HYDRALAZINE HCL 20 MG/ML IJ SOLN: 10.0000 mg | INTRAMUSCULAR | Status: AC | PRN

## 2019-08-22 MED ORDER — POTASSIUM CHLORIDE CRYS ER 20 MEQ PO TBCR
20.0000 meq | EXTENDED_RELEASE_TABLET | Freq: Once | ORAL | Status: AC
  Administered 2019-08-22: 20 meq via ORAL
  Filled 2019-08-22: qty 1

## 2019-08-22 MED ORDER — IOHEXOL 350 MG/ML SOLN
INTRAVENOUS | Status: DC | PRN
  Administered 2019-08-22: 55 mL via INTRA_ARTERIAL

## 2019-08-22 MED ORDER — MIDAZOLAM HCL 2 MG/2ML IJ SOLN
INTRAMUSCULAR | Status: DC | PRN
  Administered 2019-08-22: 2 mg via INTRAVENOUS

## 2019-08-22 MED ORDER — POTASSIUM CHLORIDE 10 MEQ/100ML IV SOLN: 10.0000 meq | INTRAVENOUS | Status: DC

## 2019-08-22 SURGICAL SUPPLY — 19 items
BALLN SAPPHIRE 3.0X15 (BALLOONS) ×2
BALLN SAPPHIRE ~~LOC~~ 4.0X15 (BALLOONS) ×1 IMPLANT
BALLOON SAPPHIRE 3.0X15 (BALLOONS) IMPLANT
CATH 5FR JL3.5 JR4 ANG PIG MP (CATHETERS) ×1 IMPLANT
CATH BALLN WEDGE 5F 110CM (CATHETERS) ×1 IMPLANT
CATH VISTA GUIDE 6FR XBLAD3.5 (CATHETERS) ×1 IMPLANT
DEVICE RAD COMP TR BAND LRG (VASCULAR PRODUCTS) ×1 IMPLANT
GLIDESHEATH SLEND SS 6F .021 (SHEATH) ×1 IMPLANT
GUIDEWIRE INQWIRE 1.5J.035X260 (WIRE) IMPLANT
INQWIRE 1.5J .035X260CM (WIRE) ×2
KIT ENCORE 26 ADVANTAGE (KITS) ×1 IMPLANT
KIT ESSENTIALS PG (KITS) ×1 IMPLANT
KIT HEART LEFT (KITS) ×2 IMPLANT
PACK CARDIAC CATHETERIZATION (CUSTOM PROCEDURE TRAY) ×2 IMPLANT
SHEATH GLIDE SLENDER 4/5FR (SHEATH) ×1 IMPLANT
STENT SYNERGY DES 3.5X20 (Permanent Stent) ×1 IMPLANT
TRANSDUCER W/STOPCOCK (MISCELLANEOUS) ×2 IMPLANT
TUBING CIL FLEX 10 FLL-RA (TUBING) ×2 IMPLANT
WIRE ASAHI PROWATER 180CM (WIRE) ×1 IMPLANT

## 2019-08-22 NOTE — Progress Notes (Signed)
Progress Note  Patient Name: Joshua [name] Date of Encounter: 08/22/2019  Primary Cardiologist:   [name], MD   Subjective   No pain.  No SOB.   Inpatient Medications    Scheduled Meds: . allopurinol  300 mg Oral Daily  . amLODipine  10 mg Oral Daily  . aspirin EC  81 mg Oral Daily  . [START ON 08/23/2019] heparin  5,000 Units Subcutaneous Q8H  . insulin aspart  0-15 Units Subcutaneous TID WC  . insulin aspart  6 Units Subcutaneous TID WC  . insulin glargine  20 Units Subcutaneous BID  . isosorbide mononitrate  120 mg Oral BID  . metoprolol succinate  150 mg Oral Daily  . pantoprazole  40 mg Oral BID AC  . pneumococcal 23 valent vaccine  0.5 mL Intramuscular Tomorrow-1000  . rosuvastatin  40 mg Oral Daily  . sodium chloride flush  3 mL Intravenous Q12H  . sodium chloride flush  3 mL Intravenous Q12H  . sodium chloride flush  3 mL Intravenous Q12H  . sodium chloride flush  3 mL Intravenous Q12H  . ticagrelor  90 mg Oral BID   Continuous Infusions: . sodium chloride 75 mL/hr at 08/22/19 1006  . sodium chloride     PRN Meds: sodium chloride, acetaminophen **OR** acetaminophen, albuterol, hydrALAZINE, nitroGLYCERIN, ondansetron **OR** ondansetron (ZOFRAN) IV, senna-docusate, sodium chloride flush   Vital Signs    Vitals:   08/22/19 0927 08/22/19 0932 08/22/19 0937 08/22/19 1004  BP: (!) 178/103 (!) 169/98 (!) 172/101 (!) 144/87  Pulse: 72 70 73   Resp: $Remo'12 12 17 16  'ZITdO$ Temp:      TempSrc:      SpO2: 100% 100% 100% 100%  Weight:      Height:        Intake/Output Summary (Last 24 hours) at 08/22/2019 1037 Last data filed at 08/22/2019 0630 Gross per 24 hour  Intake 1084.93 ml  Output 2675 ml  Net -1590.07 ml   Filed Weights   08/20/19 0551 08/21/19 0423 08/22/19 0541  Weight: (!) 138.8 kg (!) 140.5 kg (!) 142.2 kg    Telemetry    NSR - Personally Reviewed  ECG    NA - Personally Reviewed  Physical Exam   GEN: No acute distress.   Neck: No  JVD  Cardiac: RRR, no murmurs, rubs, or gallops.  Respiratory: Clear  to auscultation bilaterally. GI: Soft, nontender, non-distended  MS: No  edema; No deformity. Neuro:  Nonfocal  Psych: Normal affect   Labs    Chemistry Recent Labs  Lab 08/17/19 0147 08/17/19 0402  08/20/19 0253 08/21/19 0441 08/22/19 0434  NA 138  --    < > 136 138 140  K 3.6  --    < > 3.9 4.2 3.4*  CL 106  --    < > 101 105 107  CO2 19*  --    < > 23 22 21*  GLUCOSE 215*  --    < > 231* 83 54*  BUN 46*  --    < > 43* 44* 38*  CREATININE 2.80*  --    < > 3.30* 3.10* 3.01*  CALCIUM 8.7*  --    < > 9.2 9.2 9.1  PROT 7.3  --   --   --   --   --   ALBUMIN 3.3*  --   --   --   --   --   AST 21  --   --   --   --   --  ALT QUANTITY NOT SUFFICIENT, UNABLE TO PERFORM TEST 17  --   --   --   --   ALKPHOS 112  --   --   --   --   --   BILITOT QUANTITY NOT SUFFICIENT, UNABLE TO PERFORM TEST 0.5  --   --   --   --   GFRNONAA 23*  --    < > 19* 21* 21*  GFRAA 27*  --    < > 22* 24* 25*  ANIONGAP 13  --    < > $R'12 11 12   'SS$ < > = values in this interval not displayed.     Hematology Recent Labs  Lab 08/20/19 0253 08/21/19 0441 08/22/19 0434  WBC 8.3 8.4 9.1  RBC 5.15 4.80 4.59  HGB 14.2 13.1 12.6*  HCT 44.0 41.3 39.6  MCV 85.4 86.0 86.3  MCH 27.6 27.3 27.5  MCHC 32.3 31.7 31.8  RDW 15.9* 15.8* 16.1*  PLT 184 163 175    Cardiac EnzymesNo results for input(s): TROPONINI in the last 168 hours. No results for input(s): TROPIPOC in the last 168 hours.   BNP Recent Labs  Lab 08/16/19 2133  BNP 81.1     DDimer No results for input(s): DDIMER in the last 168 hours.   Radiology    No results found.  Cardiac Studies   CARDIAC CATH   Prox LAD lesion is 95% stenosed.  A drug-eluting stent was successfully placed using a STENT SYNERGY DES 3.5X20.  Post intervention, there is a 0% residual stenosis.  Ost 1st Diag to 1st Diag lesion is 40% stenosed.  Dist LAD lesion is 10% stenosed.  Non-stenotic  2nd Mrg lesion was previously treated.  3rd Mrg lesion is 30% stenosed.  Mid Cx lesion is 50% stenosed.  RPDA lesion is 30% stenosed.  Prox RCA to Mid RCA lesion is 45% stenosed.  Mid RCA lesion is 25% stenosed.  LV end diastolic pressure is normal.  Hemodynamic findings consistent with mild pulmonary hypertension.   1. Single vessel obstructive CAD with restenosis in the proximal LAD stent.  2. Successful PCI of the proximal LAD with DES x 1 3. Mild pulmonary HTN 4. Normal LV filling pressures 5. Procedure with 50 cc of contrast used   Most Recent Value  Fick Cardiac Output 6.91 L/min  Fick Cardiac Output Index 2.77 (L/min)/BSA  RA A Wave 5 mmHg  RA V Wave 5 mmHg  RA Mean 2 mmHg  RV Systolic Pressure 37 mmHg  RV Diastolic Pressure 0 mmHg  RV EDP 7 mmHg  PA Systolic Pressure 44 mmHg  PA Diastolic Pressure 15 mmHg  PA Mean 30 mmHg  PW A Wave 12 mmHg  PW V Wave 7 mmHg  PW Mean 4 mmHg  AO Systolic Pressure 193 mmHg  AO Diastolic Pressure 82 mmHg  AO Mean 790 mmHg  LV Systolic Pressure 240 mmHg  LV Diastolic Pressure 5 mmHg  LV EDP 13 mmHg  AOp Systolic Pressure 973 mmHg  AOp Diastolic Pressure 81 mmHg  AOp Mean Pressure 532 mmHg  LVp Systolic Pressure 992 mmHg  LVp Diastolic Pressure 3 mmHg  LVp EDP Pressure 12 mmHg  QP/QS 1  TPVR Index 10.81 HRUI  TSVR Index 40.02 HRUI  PVR SVR Ratio 0.24  TPVR/TSVR Ratio 0.27     Patient Profile     63 y.o. male with a hx of long-standing CAD with multiple PCIs, intermittent medication noncompliance,HTN, DM2, obesity, HDL, CKD, Sleep apnea, morbid  obesitywho is being seen for the evaluation ofchest pain. Also w/ acute on chronic renal failure.   Assessment & Plan    CAD:  PCI as above.  I reviewed the films with him today.   CKD:  Now post contrast exposure we will follow creat.    Minimal contrast used.    HTN:  Continue current therapy.  BPs are elevated.  I will consider increased hydralazine before discharge.  Ultimately he needs weight loss with diet and exercise and we talked at length about this.    DYSLIPIDEMIA:  LDL close to goal. Continue current statin.  ACUTE ON CHRONIC DIASTOLIC DYSFUNCTION:    Right heart pressures consistent with mild pulmonary HTN.  However, RA pressure only 5.  Holding diuresis for renal recovery.  PCWP low.  He should be able to handle positive volumes over the next 24 hours.      For questions or updates, please contact [place] Please consult [email or web address] for contact info under Cardiology/STEMI.   Signed, Minus Breeding, MD  08/22/2019, 10:37 AM

## 2019-08-22 NOTE — Progress Notes (Signed)
Admit: 08/16/2019 LOS: 44  63 year old male with unstable angina, extensive CAD history with PCI procedures and diastolic heart failure exacerbation.  He has a of CKD 3.  Likely etiology of the acute renal injury would be related to his cardiac disease.  Doubt obstruction or active glomerular process.  Subjective:  . LHC this morning with 95% lesion in LAD, in-stent restenosis, treated with PCI . Normal LV filling pressure . Received 50 mL's of IV contrast . Patient without complaint . Creatinine stable at 3.0 . 2.9 L urine output yesterday  09/08 0701 - 09/09 0700 In: 1324.9 [P.O.:1080; I.V.:244.9] Out: 2875 [Urine:2875]  Filed Weights   08/20/19 0551 08/21/19 0423 08/22/19 0541  Weight: (!) 138.8 kg (!) 140.5 kg (!) 142.2 kg    Scheduled Meds: . allopurinol  300 mg Oral Daily  . amLODipine  10 mg Oral Daily  . aspirin EC  81 mg Oral Daily  . [START ON 08/23/2019] heparin  5,000 Units Subcutaneous Q8H  . insulin aspart  0-15 Units Subcutaneous TID WC  . insulin aspart  6 Units Subcutaneous TID WC  . insulin glargine  20 Units Subcutaneous BID  . isosorbide mononitrate  120 mg Oral BID  . metoprolol succinate  150 mg Oral Daily  . pantoprazole  40 mg Oral BID AC  . pneumococcal 23 valent vaccine  0.5 mL Intramuscular Tomorrow-1000  . rosuvastatin  40 mg Oral Daily  . sodium chloride flush  3 mL Intravenous Q12H  . sodium chloride flush  3 mL Intravenous Q12H  . sodium chloride flush  3 mL Intravenous Q12H  . sodium chloride flush  3 mL Intravenous Q12H  . ticagrelor  90 mg Oral BID   Continuous Infusions: . sodium chloride 75 mL/hr at 08/22/19 1006  . sodium chloride     PRN Meds:.sodium chloride, acetaminophen **OR** acetaminophen, albuterol, hydrALAZINE, nitroGLYCERIN, ondansetron **OR** ondansetron (ZOFRAN) IV, senna-docusate, sodium chloride flush  Current Labs: reviewed    Physical Exam:  Blood pressure 123/73, pulse 62, temperature 98.2 F (36.8 C), temperature  source Oral, resp. rate 18, height $RemoveBe'5\' 9"'ZzpevphJT$  (1.753 m), weight (!) 142.2 kg, SpO2 99 %. GEN: Obese, NAD, sitting on edge of bed, pleasant ENT: NCAT EYES: EOMI CV: Regular, normal rate, normal S1 and S2 PULM: Clear bilaterally ABD: Soft, nontender  SKIN: No rashes or lesions EXT: Trace edema Nonfocal, CN II through XII grossly intact  A 1. Unstable angina: Status post LHC 9/9 with PCI to proximal LAD 2. AoCKD 3: Largely as above, continue to trend creatinine and strict I's and O's. 3. Diastolic heart failure exacerbation: Appears improved, has diuresed well, 20 pounds less than admission weight. 4. DM2 5. Hypertension, BP stable  P . Follow for another 24 hours to see any effect of the contrast exposure . Medication Issues; o Preferred narcotic agents for pain control are hydromorphone, fentanyl, and methadone. Morphine should not be used.  o Baclofen should be avoided o Avoid oral sodium phosphate and magnesium citrate based laxatives / bowel preps    Pearson Grippe MD 08/22/2019, 1:11 PM  Recent Labs  Lab 08/20/19 0253 08/21/19 0441 08/22/19 0434  NA 136 138 140  K 3.9 4.2 3.4*  CL 101 105 107  CO2 23 22 21*  GLUCOSE 231* 83 54*  BUN 43* 44* 38*  CREATININE 3.30* 3.10* 3.01*  CALCIUM 9.2 9.2 9.1   Recent Labs  Lab 08/20/19 0253 08/21/19 0441 08/22/19 0434  WBC 8.3 8.4 9.1  NEUTROABS 5.3 5.3 5.2  HGB 14.2 13.1 12.6*  HCT 44.0 41.3 39.6  MCV 85.4 86.0 86.3  PLT 184 163 175

## 2019-08-22 NOTE — Research (Signed)
PHDE Informed Consent   Subject Name: Joshua [name]  Subject met inclusion and exclusion criteria.  The informed consent form, study requirements and expectations were reviewed with the subject and questions and concerns were addressed prior to the signing of the consent form.  The subject verbalized understanding of the trail requirements.  The subject agreed to participate in the PHDE trial and signed the informed consent.  The informed consent was obtained prior to performance of any protocol-specific procedures for the subject.  A copy of the signed informed consent was given to the subject and a copy was placed in the subject's medical record.  [name] 08/22/2019, 3:30 PM

## 2019-08-22 NOTE — Interval H&P Note (Signed)
History and Physical Interval Note:  08/22/2019 8:39 AM  Joshua [name]  has presented today for surgery, with the diagnosis of unstable angina.  The various methods of treatment have been discussed with the patient and family. After consideration of risks, benefits and other options for treatment, the patient has consented to  Procedure(s): RIGHT/LEFT HEART CATH AND CORONARY ANGIOGRAPHY (N/A) as a surgical intervention.  The patient's history has been reviewed, patient examined, no change in status, stable for surgery.  I have reviewed the patient's chart and labs.  Questions were answered to the patient's satisfaction.    Cath Lab Visit (complete for each Cath Lab visit)  Clinical Evaluation Leading to the Procedure:   ACS: Yes.    Non-ACS:    Anginal Classification: CCS III  Anti-ischemic medical therapy: Maximal Therapy (2 or more classes of medications)  Non-Invasive Test Results: No non-invasive testing performed  Prior CABG: No previous CABG       [name] 08/22/2019 8:39 AM

## 2019-08-22 NOTE — Progress Notes (Signed)
PROGRESS NOTE    Joshua [name]  STM:[id number] DOB: [birth year] DOA: 08/16/2019 PCP: [name], MD  Brief Narrative: Joshua [name] is a 63 y.o. male with medical history significant for extensive cardiac history, CAD with multiple PCI's, diabetes mellitus type 2, hypertension, hyperlipidemia, CKD, sleep apnea, morbid obesity, presents to the ED complaining of chest pain.  Patient reports intermittent left-sided chest pain for the past 10 days worsening over the past 2 days, describes it as pressure, nonradiating, 7/10 at its worse, minimally relieved with nitroglycerin with associated chronic shortness of breath, lower extremity edema and weight gain of about 25 pounds in the last couple of months.    Last catheterization was done in April for NSTEMI, which showed stent restenosis in the proximal LAD treated with cutting balloon PCI. Admitted with unstable angina and acute on chronic diastolic CHF  Assessment & Plan:   Unstable angina with extensive CAD hx s/p multiple stents -extensive cardiac history -Last cath-03/2019 showed Ost LAD to Prox LAD lesion 90% stenosed in-stent restenosis s/p balloon angioplasty -Cardiology following, continue aspirin, brillinta, metoprolol, crestor Status post cardiac cath and PCI with LAD stent on 08/22/2019.  Doing well. I did extensive counseling on lifestyle modification, leaning towards plant-based diet and vegetables, exercising and weight loss.  He verbalized understanding.  Spent 15 minutes on that.  Acute on chronic diastolic HF -Last ECHO on 03/2019- EF of 55-60% Euvolemic.  No more Lasix.  AKI on CKD stage 3 Baseline Cr around 2, but came in with 3.03 which has now improved and went down to only 2.65 only to rise again to 3.3 which has improved today to 3 today.  Nephrology on board.  Nephrology and cardiology prefers to keep him overnight and monitor his renal function for 24 hours.  Avoiding nephrotoxic agents.  Hypokalemia: Replace.   Uncontrolled Diabetes mellitus type 2 with hyperglycemia Had hypoglycemia.  Was n.p.o.  Will reduce Lantus to 20 units twice daily and continue SSI.  HTN Stable Continue home regimen with metoprolol, amlodipine, imdur  HLD Continue crestor  GERD Continue PPI BID  Gout Stable Continue Allopurinol  OSA Continue cpap  Morbid obesity Counseling provided.  DVT prophylaxis: Heparin drip  Code Status: Full Family Communication: None at bedside Disposition Plan: Likely home after cath on Tuesday.  Consults : Cardiology  Nephrology    Procedures:   Antimicrobials:    Subjective: Patient seen and examined post cath.  No complaints.  No chest pain.  No shortness of breath.  Objective: Vitals:   08/22/19 1415 08/22/19 1416 08/22/19 1445 08/22/19 1528  BP: 99/62 106/63 110/78 133/82  Pulse: 67 65    Resp:      Temp:      TempSrc:      SpO2: 100% 100%  99%  Weight:      Height:        Intake/Output Summary (Last 24 hours) at 08/22/2019 1541 Last data filed at 08/22/2019 1415 Gross per 24 hour  Intake 1516.18 ml  Output 2725 ml  Net -1208.82 ml   Filed Weights   08/20/19 0551 08/21/19 0423 08/22/19 0541  Weight: (!) 138.8 kg (!) 140.5 kg (!) 142.2 kg    Examination:  General exam: Appears calm and comfortable, morbidly obese. Respiratory system: Clear to auscultation. Respiratory effort normal. Cardiovascular system: S1 & S2 heard, RRR. No JVD, murmurs, rubs, gallops or clicks. No pedal edema. Gastrointestinal system: Abdomen is nondistended, soft and nontender. No organomegaly or masses felt. Normal bowel sounds  heard. Central nervous system: Alert and oriented. No focal neurological deficits. Extremities: Symmetric 5 x 5 power. Skin: No rashes, lesions or ulcers.  Psychiatry: Judgement and insight appear normal. Mood & affect appropriate.   Data Reviewed:   CBC: Recent Labs  Lab 08/18/19 0221 08/19/19 0607 08/20/19 0253 08/21/19 0441  08/22/19 0434  WBC 6.7 8.3 8.3 8.4 9.1  NEUTROABS 4.3 5.2 5.3 5.3 5.2  HGB 12.5* 14.1 14.2 13.1 12.6*  HCT 38.2* 44.1 44.0 41.3 39.6  MCV 84.9 86.3 85.4 86.0 86.3  PLT 170 198 184 163 161   Basic Metabolic Panel: Recent Labs  Lab 08/18/19 0221 08/19/19 0607 08/20/19 0253 08/21/19 0441 08/22/19 0434  NA 137 140 136 138 140  K 3.2* 3.2* 3.9 4.2 3.4*  CL 104 101 101 105 107  CO2 $Re'23 25 23 22 'YmE$ 21*  GLUCOSE 211* 122* 231* 83 54*  BUN 44* 37* 43* 44* 38*  CREATININE 2.68* 2.65* 3.30* 3.10* 3.01*  CALCIUM 8.9 9.7 9.2 9.2 9.1   GFR: Estimated Creatinine Clearance: 36.7 mL/min (A) (by C-G formula based on SCr of 3.01 mg/dL (H)). Liver Function Tests: Recent Labs  Lab 08/17/19 0147 08/17/19 0402  AST 21  --   ALT QUANTITY NOT SUFFICIENT, UNABLE TO PERFORM TEST 17  ALKPHOS 112  --   BILITOT QUANTITY NOT SUFFICIENT, UNABLE TO PERFORM TEST 0.5  PROT 7.3  --   ALBUMIN 3.3*  --    No results for input(s): LIPASE, AMYLASE in the last 168 hours. No results for input(s): AMMONIA in the last 168 hours. Coagulation Profile: No results for input(s): INR, PROTIME in the last 168 hours. Cardiac Enzymes: No results for input(s): CKTOTAL, CKMB, CKMBINDEX, TROPONINI in the last 168 hours. BNP (last 3 results) No results for input(s): PROBNP in the last 8760 hours. HbA1C: No results for input(s): HGBA1C in the last 72 hours. CBG: Recent Labs  Lab 08/22/19 0900 08/22/19 1022 08/22/19 1144 08/22/19 1303 08/22/19 1509  GLUCAP 99 76 125* 106* 83   Lipid Profile: No results for input(s): CHOL, HDL, LDLCALC, TRIG, CHOLHDL, LDLDIRECT in the last 72 hours. Thyroid Function Tests: No results for input(s): TSH, T4TOTAL, FREET4, T3FREE, THYROIDAB in the last 72 hours. Anemia Panel: No results for input(s): VITAMINB12, FOLATE, FERRITIN, TIBC, IRON, RETICCTPCT in the last 72 hours. Urine analysis:    Component Value Date/Time   COLORURINE STRAW (A) 08/16/2019 2120   APPEARANCEUR CLEAR  08/16/2019 2120   LABSPEC 1.005 08/16/2019 2120   PHURINE 5.0 08/16/2019 2120   GLUCOSEU NEGATIVE 08/16/2019 2120   HGBUR SMALL (A) 08/16/2019 2120   BILIRUBINUR NEGATIVE 08/16/2019 2120   KETONESUR NEGATIVE 08/16/2019 2120   PROTEINUR 100 (A) 08/16/2019 2120   UROBILINOGEN 0.2 11/01/2011 1410   NITRITE NEGATIVE 08/16/2019 2120   LEUKOCYTESUR NEGATIVE 08/16/2019 2120   Sepsis Labs: $RemoveBefo'@[id number]'TCmKMzJqDmn$ (procalcitonin:4,lacticidven:4)  ) Recent Results (from the past 240 hour(s))  SARS Coronavirus 2 [facility] order, Performed in [facility] hospital lab) Nasopharyngeal Nasopharyngeal Swab     Status: None   Collection Time: 08/16/19  2:50 PM   Specimen: Nasopharyngeal Swab  Result Value Ref Range Status   SARS Coronavirus 2 NEGATIVE NEGATIVE Final    Comment: (NOTE) If result is NEGATIVE SARS-CoV-2 target nucleic acids are NOT DETECTED. The SARS-CoV-2 RNA is generally detectable in upper and lower  respiratory specimens during the acute phase of infection. The lowest  concentration of SARS-CoV-2 viral copies this assay can detect is 250  copies / mL. A negative result  does not preclude SARS-CoV-2 infection  and should not be used as the sole basis for treatment or other  patient management decisions.  A negative result may occur with  improper specimen collection / handling, submission of specimen other  than nasopharyngeal swab, presence of viral mutation(s) within the  areas targeted by this assay, and inadequate number of viral copies  (<250 copies / mL). A negative result must be combined with clinical  observations, patient history, and epidemiological information. If result is POSITIVE SARS-CoV-2 target nucleic acids are DETECTED. The SARS-CoV-2 RNA is generally detectable in upper and lower  respiratory specimens dur ing the acute phase of infection.  Positive  results are indicative of active infection with SARS-CoV-2.  Clinical  correlation with patient history and other  diagnostic information is  necessary to determine patient infection status.  Positive results do  not rule out bacterial infection or co-infection with other viruses. If result is PRESUMPTIVE POSTIVE SARS-CoV-2 nucleic acids MAY BE PRESENT.   A presumptive positive result was obtained on the submitted specimen  and confirmed on repeat testing.  While 2019 novel coronavirus  (SARS-CoV-2) nucleic acids may be present in the submitted sample  additional confirmatory testing may be necessary for epidemiological  and / or clinical management purposes  to differentiate between  SARS-CoV-2 and other Sarbecovirus currently known to infect humans.  If clinically indicated additional testing with an alternate test  methodology [phone]) is advised. The SARS-CoV-2 RNA is generally  detectable in upper and lower respiratory sp ecimens during the acute  phase of infection. The expected result is Negative. Fact Sheet for Patients:  StrictlyIdeas.no Fact Sheet for Healthcare Providers: BankingDealers.co.za This test is not yet approved or cleared by the [place] FDA and has been authorized for detection and/or diagnosis of SARS-CoV-2 by FDA under an Emergency Use Authorization (EUA).  This EUA will remain in effect (meaning this test can be used) for the duration of the COVID-19 declaration under Section 564(b)(1) of the Act, 21 U.S.C. section 360bbb-3(b)(1), unless the authorization is terminated or revoked sooner. Performed at [facility], [place] [address] [place], [place] [address]      Radiology Studies: No results found.   Scheduled Meds: . allopurinol  300 mg Oral Daily  . amLODipine  10 mg Oral Daily  . aspirin EC  81 mg Oral Daily  . [START ON 08/23/2019] heparin  5,000 Units Subcutaneous Q8H  . insulin aspart  0-15 Units Subcutaneous TID WC  . insulin aspart  6 Units Subcutaneous TID WC  . insulin glargine  20 Units  Subcutaneous BID  . isosorbide mononitrate  120 mg Oral BID  . metoprolol succinate  150 mg Oral Daily  . pantoprazole  40 mg Oral BID AC  . pneumococcal 23 valent vaccine  0.5 mL Intramuscular Tomorrow-1000  . rosuvastatin  40 mg Oral Daily  . sodium chloride flush  3 mL Intravenous Q12H  . sodium chloride flush  3 mL Intravenous Q12H  . sodium chloride flush  3 mL Intravenous Q12H  . sodium chloride flush  3 mL Intravenous Q12H  . ticagrelor  90 mg Oral BID   Continuous Infusions: . sodium chloride       LOS: 5 days   Time spent: 30 minutes  Darliss Cheney, MD [facility]  08/22/2019, 3:41 PM

## 2019-08-22 NOTE — Progress Notes (Signed)
Patient placed CPAP on self.

## 2019-08-22 NOTE — Progress Notes (Signed)
TR band removed per protocol. 2x2 and clear tegaderm applied. Site clean, dry and intact. No immediate complications.   Patient with low blood sugars throughout shift, MD made aware. Order received to hold nighttime dose of lantus.

## 2019-08-22 NOTE — Progress Notes (Addendum)
Hypoglycemic Event  CBG: 57  Treatment: D50 25 mL (12.5 gm)  Symptoms: None  Follow-up CBG: Time:0900 CBG Result: 99  Possible Reasons for Event: Inadequate meal intake  Comments/MD notified: Pt was NPO for a procedure - pt transferred to cath lab at 0822 - cath lab RN made aware to follow up with blood sugar     Cathi Roan

## 2019-08-22 NOTE — Progress Notes (Signed)
Hypoglycemic Event  CBG: 53  Treatment: 8 oz juice/soda  Symptoms: None  Follow-up CBG: Time:1735 CBG Result:73  Possible Reasons for Event: Inadequate meal intake  Comments/MD notified:Yes - holding pm dose of lantus (already decreased dose this am)    Cathi Roan

## 2019-08-22 NOTE — Progress Notes (Signed)
Inpatient Diabetes Program Recommendations  AACE/ADA: New Consensus Statement on Inpatient Glycemic Control (2015)  Target Ranges:  Prepandial:   less than 140 mg/dL      Peak postprandial:   less than 180 mg/dL (1-2 hours)      Critically ill patients:  140 - 180 mg/dL   Lab Results  Component Value Date   GLUCAP 106 (H) 08/22/2019   HGBA1C 10.2 (H) 08/16/2019    Review of Glycemic Control Results for KOHAN, AZIZI (MRN [id number]) as of 08/22/2019 13:37  Ref. Range 08/22/2019 10:22 08/22/2019 11:44 08/22/2019 13:03  Glucose-Capillary Latest Ref Range: 70 - 99 mg/dL 76 125 (H) 106 (H)   Diabetes history: Type 2 DM Outpatient Diabetes medications: Novolog 6 units TID, Lantus 20 units BID Current orders for Inpatient glycemic control: Novolog 6 units TID, Lantus 20 units BID, Novolog 0-15 units TID  Inpatient Diabetes Program Recommendations:    Noted hypoglycemia this AM of 54 mg/dL. Lantus decreased back to home dose. In agreement. Will continue to watch.   Thanks, Bronson Curb, MSN, RNC-OB Diabetes Coordinator [phone] (8a-5p)

## 2019-08-23 LAB — CBC WITH DIFFERENTIAL/PLATELET
Abs Immature Granulocytes: 0.05 10*3/uL (ref 0.00–0.07)
Basophils Absolute: 0 10*3/uL (ref 0.0–0.1)
Basophils Relative: 0 %
Eosinophils Absolute: 0.1 10*3/uL (ref 0.0–0.5)
Eosinophils Relative: 1 %
HCT: 39.2 % (ref 39.0–52.0)
Hemoglobin: 12.5 g/dL — ABNORMAL LOW (ref 13.0–17.0)
Immature Granulocytes: 1 %
Lymphocytes Relative: 20 %
Lymphs Abs: 1.4 10*3/uL (ref 0.7–4.0)
MCH: 27.5 pg (ref 26.0–34.0)
MCHC: 31.9 g/dL (ref 30.0–36.0)
MCV: 86.3 fL (ref 80.0–100.0)
Monocytes Absolute: 1 10*3/uL (ref 0.1–1.0)
Monocytes Relative: 13 %
Neutro Abs: 4.6 10*3/uL (ref 1.7–7.7)
Neutrophils Relative %: 65 %
Platelets: 167 10*3/uL (ref 150–400)
RBC: 4.54 MIL/uL (ref 4.22–5.81)
RDW: 16.2 % — ABNORMAL HIGH (ref 11.5–15.5)
WBC: 7.2 10*3/uL (ref 4.0–10.5)
nRBC: 0 % (ref 0.0–0.2)

## 2019-08-23 LAB — BASIC METABOLIC PANEL
Anion gap: 10 (ref 5–15)
BUN: 35 mg/dL — ABNORMAL HIGH (ref 6–20)
CO2: 20 mmol/L — ABNORMAL LOW (ref 22–32)
Calcium: 9 mg/dL (ref 8.9–10.3)
Chloride: 106 mmol/L (ref 98–111)
Creatinine, Ser: 2.78 mg/dL — ABNORMAL HIGH (ref 0.61–1.24)
GFR calc Af Amer: 27 mL/min — ABNORMAL LOW (ref >60)
GFR calc non Af Amer: 24 mL/min — ABNORMAL LOW (ref >60)
Glucose, Bld: 207 mg/dL — ABNORMAL HIGH (ref 70–99)
Potassium: 3.9 mmol/L (ref 3.5–5.1)
Sodium: 136 mmol/L (ref 135–145)

## 2019-08-23 LAB — GLUCOSE, CAPILLARY
Glucose-Capillary: 153 mg/dL — ABNORMAL HIGH (ref 70–99)
Glucose-Capillary: 214 mg/dL — ABNORMAL HIGH (ref 70–99)

## 2019-08-23 MED ORDER — METOPROLOL SUCCINATE ER 50 MG PO TB24: 150.0000 mg | ORAL_TABLET | Freq: Every day | ORAL | 0 refills | Status: DC

## 2019-08-23 NOTE — Progress Notes (Signed)
Patient has CPAP set up at bedside.  Chamber filled with distilled for humidification.  Patient states he is able to place himself on/off as needed.

## 2019-08-23 NOTE — Progress Notes (Signed)
CARDIAC REHAB PHASE I   PRE:  Rate/Rhythm: 74 SR  BP:  Sitting: 160/98      SaO2: 97 RA  MODE:  Ambulation: 470 ft   POST:  Rate/Rhythm: 105 ST  BP:  Sitting: 183/79    SaO2: 98 RA  Pt ambulated 429ft in hallway independently with steady gait. Pt denies CP, but c/o some SOB. Pt coached through purse lipped breathing. Pt educated on importance of ASA, Brilinta, and NTG. Pt given heart heart and diabetic diets. Reviewed importance of low sodium and daily weights. Also reviewed restrictions, site care, and exercise guidelines. Will refer to CRP II [place], pt agreeable to Virtual Cardiac Rehab.  Pt is interested in participating in Virtual Cardiac and Pulmonary Rehab. Pt advised that Virtual Cardiac and Pulmonary Rehab is provided at no cost to the patient.  Checklist:  1. Pt has smart device  ie smartphone and/or ipad for downloading an app  Yes 2. Reliable internet/wifi service    Yes 3. Understands how to use their smartphone and navigate within an app.  Yes  Pt verbalized understanding and is in agreement.  [phone] Rufina Falco, RN BSN 08/23/2019 9:10 AM

## 2019-08-23 NOTE — Discharge Summary (Signed)
Physician Discharge Summary  Joshua [name] SFK:[id number] DOB: 03/07/1956 DOA: 08/16/2019  PCP: [name], MD  Admit date: 08/16/2019 Discharge date: 08/23/2019  Admitted From: Home Disposition: Home  Recommendations for Outpatient Follow-up:  1. Follow up with PCP in 1-2 weeks 2. Follow with cardiology in 1 to 2 weeks 3. Follow with nephrology in 1 week 4. Please obtain BMP/CBC in one week 5. Please follow up on the following pending results:  Home Health: None Equipment/Devices: None  Discharge Condition: Stable CODE STATUS: Full code Diet recommendation: Cardiac/low-sodium.  Recommended plant-based diet.  Subjective: Patient seen and examined.  Feels much better.  Satisfied.  No complaint of chest pain or shortness of breath.  Brief/Interim Summary: Joshua [name] a 64 y.o.malewith medical history significant forextensive cardiac history,CAD with multiple PCI's, diabetes mellitus type 2, hypertension, hyperlipidemia, CKD, sleep apnea, morbid obesity, presents to the ED complaining of chest pain. Patient reports intermittent left-sided chest pain for the past 10 days worsening over the past 2 days, describes it as pressure, nonradiating,7/10 at its worse, minimally relieved with nitroglycerin with associated chronic shortness of breath,lower extremity edema and weight gain of about 25 pounds in the last couple of months.Last catheterization was done in April for NSTEMI, which showed stent restenosis in the proximal LAD treated with cutting balloon PCI. Admitted with unstable angina and acute on chronic diastolic CHF.  Started on IV Lasix.  Cardiology consulted.  He was also started on heparin drip.  Due to worsening creatinine, cardiac cath was on hold and then due to long weekend, it was hold again for 3 more days.  Eventually, he had another cardiac catheterization done on 08/22/2019 and was found to have 95% stenosis at proximal LAD and another stent was placed.  He was  kept overnight for observation and for monitoring of his renal function which in fact improved today.  He is symptom-free right now.  Cardiology has cleared him to go home with continuation of DAPT.  He has been cleared by nephrology.  He will follow with PCP, cardiology and nephrology in 1 to 2 weeks.  I have strongly advised him to stay away from junk food, lose weight, exercise and perhaps considering plant-based diet.  He verbalized understanding.  Discharge Diagnoses:  Principal Problem:   Chest pain Active Problems:   Hyperlipidemia   Benign essential HTN   GERD   Morbid obesity due to excess calories (HCC)   CAD S/P multiple PCIs   Sleep apnea- on C-pap   Unstable angina pectoris (HCC)   CKD (chronic kidney disease) stage 3, GFR 30-59 ml/min [facility])   Essential hypertension    Discharge Instructions  Discharge Instructions    Amb Referral to Cardiac Rehabilitation   Complete by: As directed    Diagnosis: Coronary Stents   After initial evaluation and assessments completed: Virtual Based Care may be provided alone or in conjunction with Phase 2 Cardiac Rehab based on patient barriers.: Yes   Discharge patient   Complete by: As directed    Discharge disposition: 01-Home or Self Care   Discharge patient date: 08/23/2019     Allergies as of 08/23/2019      Reactions   Chlorthalidone Other (See Comments)   Causes dehydration, kidneys shut down      Medication List    STOP taking these medications   sulfamethoxazole-trimethoprim 800-160 MG tablet Commonly known as: BACTRIM DS     TAKE these medications   Accu-Chek FastClix Lancets Misc USE UP TO FOUR  TIMES DAILY AS DIRECTED   Accu-Chek Guide test strip Generic drug: glucose blood USE UP TO FOUR TIMES DAILY AS DIRECTED   acetaminophen 650 MG CR tablet Commonly known as: TYLENOL Take 1,300 mg by mouth daily as needed for pain.   allopurinol 300 MG tablet Commonly known as: ZYLOPRIM Take 300 mg by mouth daily.    amLODipine 10 MG tablet Commonly known as: NORVASC Take 1 tablet (10 mg total) by mouth daily.   aspirin EC 81 MG tablet Take 81 mg by mouth daily.   blood glucose meter kit and supplies Dispense based on patient and insurance preference. Use up to four times daily as directed. (FOR ICD-10 E10.9, E11.9).   furosemide 40 MG tablet Commonly known as: LASIX Take 1 tablet (40 mg total) by mouth daily. What changed: when to take this   HM SALONPAS PAIN RELIEF EX Apply 1 spray topically daily as needed (pain).   hydrALAZINE 50 MG tablet Commonly known as: APRESOLINE Take 1 tablet (50 mg total) by mouth 3 (three) times daily.   insulin aspart 100 UNIT/ML injection Commonly known as: novoLOG Inject 6 Units into the skin 3 (three) times daily with meals. What changed:   how much to take  when to take this  additional instructions   insulin glargine 100 UNIT/ML injection Commonly known as: LANTUS Inject 0.2 mLs (20 Units total) into the skin 2 (two) times daily.   isosorbide mononitrate 120 MG 24 hr tablet Commonly known as: IMDUR Take 1 tablet (120 mg total) by mouth 2 (two) times daily.   metoprolol succinate 50 MG 24 hr tablet Commonly known as: Toprol XL Take 3 tablets (150 mg total) by mouth daily. Take with or immediately following a meal. Start taking on: August 24, 2019 What changed:   medication strength  how much to take  how to take this  when to take this  additional instructions   nitroGLYCERIN 0.4 MG SL tablet Commonly known as: NITROSTAT Place 1 tablet (0.4 mg total) under the tongue every 5 (five) minutes as needed for chest pain.   pantoprazole 40 MG tablet Commonly known as: PROTONIX TAKE 1 TABLET BY MOUTH TWICE DAILY BEFORE MEAL(S) What changed: See the new instructions.   rosuvastatin 40 MG tablet Commonly known as: CRESTOR Take 1 tablet (40 mg total) by mouth daily.   silver sulfADIAZINE 1 % cream Commonly known as:  Silvadene Apply pea-sized amount to wound daily.   ticagrelor 90 MG Tabs tablet Commonly known as: Brilinta Take 1 tablet (90 mg total) by mouth 2 (two) times daily.      Follow-up Information    Madelon Lips, MD Follow up.   Specialty: Nephrology Why: Our office will contact you with appointment information Contact information: [place] [address] [phone]        [name], MD Follow up in 1 week(s).   Specialty: Family Medicine Contact information: [place] [address] [phone]        [name], MD .   Specialty: Cardiology Contact information: [address] [place] [address] [phone]          Allergies  Allergen Reactions  . Chlorthalidone Other (See Comments)    Causes dehydration, kidneys shut down    Consultations: Cardiology and nephrology   Procedures/Studies: Dg Chest 2 View  Result Date: 08/16/2019 CLINICAL DATA:  Left-sided chest pain with shortness of breath for 1 week. EXAM: CHEST -  2 VIEW COMPARISON:  April 06, 2019 FINDINGS: Cardiomediastinal silhouette is normal. Mediastinal contours appear intact. There is no evidence of focal airspace consolidation, pleural effusion or pneumothorax. Osseous structures are without acute abnormality. Stigmata of diffuse idiopathic skeletal hyperostosis of the thoracic spine. Soft tissues are grossly normal. IMPRESSION: No active cardiopulmonary disease. Electronically Signed   By: Fidela Salisbury M.D.   On: 08/16/2019 12:31      Discharge Exam: Vitals:   08/23/19 1033 08/23/19 1034  BP: 129/75   Pulse:  80  Resp:    Temp:    SpO2:     Vitals:   08/22/19 2124 08/23/19 0629 08/23/19 1033 08/23/19 1034  BP: (!) 144/76 (!) 160/90 129/75   Pulse: 65 75  80  Resp: 18 18    Temp: 98.2 F (36.8 C) 98.1 F (36.7 C)    TempSrc: Oral Oral    SpO2: 99%     Weight:  (!) 142.7 kg    Height:        General: Pt is  alert, awake, not in acute distress, morbidly obese. Cardiovascular: RRR, S1/S2 +, no rubs, no gallops Respiratory: CTA bilaterally, no wheezing, no rhonchi Abdominal: Soft, NT, ND, bowel sounds + Extremities: no edema, no cyanosis    The results of significant diagnostics from this hospitalization (including imaging, microbiology, ancillary and laboratory) are listed below for reference.     Microbiology: Recent Results (from the past 240 hour(s))  SARS Coronavirus 2 [facility] order, Performed in [facility] hospital lab) Nasopharyngeal Nasopharyngeal Swab     Status: None   Collection Time: 08/16/19  2:50 PM   Specimen: Nasopharyngeal Swab  Result Value Ref Range Status   SARS Coronavirus 2 NEGATIVE NEGATIVE Final    Comment: (NOTE) If result is NEGATIVE SARS-CoV-2 target nucleic acids are NOT DETECTED. The SARS-CoV-2 RNA is generally detectable in upper and lower  respiratory specimens during the acute phase of infection. The lowest  concentration of SARS-CoV-2 viral copies this assay can detect is 250  copies / mL. A negative result does not preclude SARS-CoV-2 infection  and should not be used as the sole basis for treatment or other  patient management decisions.  A negative result may occur with  improper specimen collection / handling, submission of specimen other  than nasopharyngeal swab, presence of viral mutation(s) within the  areas targeted by this assay, and inadequate number of viral copies  (<250 copies / mL). A negative result must be combined with clinical  observations, patient history, and epidemiological information. If result is POSITIVE SARS-CoV-2 target nucleic acids are DETECTED. The SARS-CoV-2 RNA is generally detectable in upper and lower  respiratory specimens dur ing the acute phase of infection.  Positive  results are indicative of active infection with SARS-CoV-2.  Clinical  correlation with patient history and other diagnostic information is   necessary to determine patient infection status.  Positive results do  not rule out bacterial infection or co-infection with other viruses. If result is PRESUMPTIVE POSTIVE SARS-CoV-2 nucleic acids MAY BE PRESENT.   A presumptive positive result was obtained on the submitted specimen  and confirmed on repeat testing.  While 2019 novel coronavirus  (SARS-CoV-2) nucleic acids may be present in the submitted sample  additional confirmatory testing may be necessary for epidemiological  and / or clinical management purposes  to differentiate between  SARS-CoV-2 and other Sarbecovirus currently known to infect humans.  If clinically indicated additional testing with an alternate test  methodology [phone]) is  advised. The SARS-CoV-2 RNA is generally  detectable in upper and lower respiratory sp ecimens during the acute  phase of infection. The expected result is Negative. Fact Sheet for Patients:  StrictlyIdeas.no Fact Sheet for Healthcare Providers: BankingDealers.co.za This test is not yet approved or cleared by the [place] FDA and has been authorized for detection and/or diagnosis of SARS-CoV-2 by FDA under an Emergency Use Authorization (EUA).  This EUA will remain in effect (meaning this test can be used) for the duration of the COVID-19 declaration under Section 564(b)(1) of the Act, 21 U.S.C. section 360bbb-3(b)(1), unless the authorization is terminated or revoked sooner. Performed at [facility], [place] [address], [place], [place] [address]      Labs: BNP (last 3 results) Recent Labs    04/06/19 1857 08/16/19 2133  BNP 76.7 84.6   Basic Metabolic Panel: Recent Labs  Lab 08/19/19 0607 08/20/19 0253 08/21/19 0441 08/22/19 0434 08/22/19 0902 08/22/19 0905 08/23/19 0340  NA 140 136 138 140 143 143 136  K 3.2* 3.9 4.2 3.4* 3.3* 3.3* 3.9  CL 101 101 105 107  --   --  106  CO2 _0 21*  --   --  20*   GLUCOSE 122* 231* 83 54*  --   --  207*  BUN 37* 43* 44* 38*  --   --  35*  CREATININE 2.65* 3.30* 3.10* 3.01*  --   --  2.78*  CALCIUM 9.7 9.2 9.2 9.1  --   --  9.0   Liver Function Tests: Recent Labs  Lab 08/17/19 0147 08/17/19 0402  AST 21  --   ALT QUANTITY NOT SUFFICIENT, UNABLE TO PERFORM TEST 17  ALKPHOS 112  --   BILITOT QUANTITY NOT SUFFICIENT, UNABLE TO PERFORM TEST 0.5  PROT 7.3  --   ALBUMIN 3.3*  --    No results for input(s): LIPASE, AMYLASE in the last 168 hours. No results for input(s): AMMONIA in the last 168 hours. CBC: Recent Labs  Lab 08/19/19 0607 08/20/19 0253 08/21/19 0441 08/22/19 0434 08/22/19 0902 08/22/19 0905 08/23/19 0340  WBC 8.3 8.3 8.4 9.1  --   --  7.2  NEUTROABS 5.2 5.3 5.3 5.2  --   --  4.6  HGB 14.1 14.2 13.1 12.6* 12.6* 12.6* 12.5*  HCT 44.1 44.0 41.3 39.6 37.0* 37.0* 39.2  MCV 86.3 85.4 86.0 86.3  --   --  86.3  PLT 198 184 163 175  --   --  167   Cardiac Enzymes: No results for input(s): CKTOTAL, CKMB, CKMBINDEX, TROPONINI in the last 168 hours. BNP: Invalid input(s): POCBNP CBG: Recent Labs  Lab 08/22/19 1509 08/22/19 1721 08/22/19 1757 08/22/19 2135 08/23/19 0802  GLUCAP 83 53* 73 152* 153*   D-Dimer No results for input(s): DDIMER in the last 72 hours. Hgb A1c No results for input(s): HGBA1C in the last 72 hours. Lipid Profile No results for input(s): CHOL, HDL, LDLCALC, TRIG, CHOLHDL, LDLDIRECT in the last 72 hours. Thyroid function studies No results for input(s): TSH, T4TOTAL, T3FREE, THYROIDAB in the last 72 hours.  Invalid input(s): FREET3 Anemia work up No results for input(s): VITAMINB12, FOLATE, FERRITIN, TIBC, IRON, RETICCTPCT in the last 72 hours. Urinalysis    Component Value Date/Time   COLORURINE STRAW (A) 08/16/2019 2120   APPEARANCEUR CLEAR 08/16/2019 2120   LABSPEC 1.005 08/16/2019 2120   PHURINE 5.0 08/16/2019 2120   GLUCOSEU NEGATIVE 08/16/2019 2120   HGBUR SMALL (A) 08/16/2019  2120    [place] 08/16/2019 2120   [place] 08/16/2019 2120   PROTEINUR 100 (A) 08/16/2019 2120   UROBILINOGEN 0.2 11/01/2011 1410   NITRITE NEGATIVE 08/16/2019 2120   LEUKOCYTESUR NEGATIVE 08/16/2019 2120   Sepsis Labs Invalid input(s): PROCALCITONIN,  WBC,  LACTICIDVEN Microbiology Recent Results (from the past 240 hour(s))  SARS Coronavirus 2 [facility] order, Performed in [facility] hospital lab) Nasopharyngeal Nasopharyngeal Swab     Status: None   Collection Time: 08/16/19  2:50 PM   Specimen: Nasopharyngeal Swab  Result Value Ref Range Status   SARS Coronavirus 2 NEGATIVE NEGATIVE Final    Comment: (NOTE) If result is NEGATIVE SARS-CoV-2 target nucleic acids are NOT DETECTED. The SARS-CoV-2 RNA is generally detectable in upper and lower  respiratory specimens during the acute phase of infection. The lowest  concentration of SARS-CoV-2 viral copies this assay can detect is 250  copies / mL. A negative result does not preclude SARS-CoV-2 infection  and should not be used as the sole basis for treatment or other  patient management decisions.  A negative result may occur with  improper specimen collection / handling, submission of specimen other  than nasopharyngeal swab, presence of viral mutation(s) within the  areas targeted by this assay, and inadequate number of viral copies  (<250 copies / mL). A negative result must be combined with clinical  observations, patient history, and epidemiological information. If result is POSITIVE SARS-CoV-2 target nucleic acids are DETECTED. The SARS-CoV-2 RNA is generally detectable in upper and lower  respiratory specimens dur ing the acute phase of infection.  Positive  results are indicative of active infection with SARS-CoV-2.  Clinical  correlation with patient history and other diagnostic information is  necessary to determine patient infection status.  Positive results do  not rule out bacterial infection or  co-infection with other viruses. If result is PRESUMPTIVE POSTIVE SARS-CoV-2 nucleic acids MAY BE PRESENT.   A presumptive positive result was obtained on the submitted specimen  and confirmed on repeat testing.  While 2019 novel coronavirus  (SARS-CoV-2) nucleic acids may be present in the submitted sample  additional confirmatory testing may be necessary for epidemiological  and / or clinical management purposes  to differentiate between  SARS-CoV-2 and other Sarbecovirus currently known to infect humans.  If clinically indicated additional testing with an alternate test  methodology [phone]) is advised. The SARS-CoV-2 RNA is generally  detectable in upper and lower respiratory sp ecimens during the acute  phase of infection. The expected result is Negative. Fact Sheet for Patients:  StrictlyIdeas.no Fact Sheet for Healthcare Providers: BankingDealers.co.za This test is not yet approved or cleared by the [place] FDA and has been authorized for detection and/or diagnosis of SARS-CoV-2 by FDA under an Emergency Use Authorization (EUA).  This EUA will remain in effect (meaning this test can be used) for the duration of the COVID-19 declaration under Section 564(b)(1) of the Act, 21 U.S.C. section 360bbb-3(b)(1), unless the authorization is terminated or revoked sooner. Performed at [facility], [place] [address], [place], [place] [address]      Time coordinating discharge: Over 30 minutes  SIGNED:   Darliss Cheney, MD  [facility] 08/23/2019, 11:24 AM Pager [phone]  If 7PM-7AM, please contact night-coverage [email or web address] Password [email or web address]

## 2019-08-23 NOTE — Discharge Instructions (Signed)
Coronary Artery Disease, Male Coronary artery disease (CAD) is a condition in which the arteries that lead to the heart (coronary arteries) become narrow or blocked. The narrowing or blockage can lead to decreased blood flow to the heart. Prolonged reduced blood flow can cause a heart attack (myocardial infarction or MI). This condition may also be called coronary heart disease. Because CAD is the leading cause of death in men, it is important to understand what causes this condition and how it is treated. What are the causes? CAD is most often caused by atherosclerosis. This is the buildup of fat and cholesterol (plaque) on the inside of the arteries. Over time, the plaque may narrow or block the artery, reducing blood flow to the heart. Plaque can also become weak and break off within a coronary artery and cause a sudden blockage. Other less common causes of CAD include:  A blood clot or a piece of a blood clot or other substance that blocks the flow of blood in a coronary artery (embolism).  A tearing of the artery (spontaneous coronary artery dissection).  An enlargement of an artery (aneurysm).  Inflammation (vasculitis) in the artery wall. What increases the risk? The following factors may make you more likely to develop this condition:  Age. Men over age 69 are at a greater risk of CAD.  Family history of CAD.  Gender. Men often develop CAD earlier in life than women.  High blood pressure (hypertension).  Diabetes.  High cholesterol levels.  Tobacco use.  Excessive alcohol use.  Lack of exercise.  A diet high in saturated and trans fats, such as fried food and processed meat. Other possible risk factors include:  High stress levels.  Depression.  Obesity.  Sleep apnea. What are the signs or symptoms? Many people do not have any symptoms during the early stages of CAD. As the condition progresses, symptoms may include:  Chest pain (angina). The pain can: ? Feel  like crushing or squeezing, or like a tightness, pressure, fullness, or heaviness in the chest. ? Last more than a few minutes or can stop and recur. The pain tends to get worse with exercise or stress and to fade with rest.  Pain in the arms, neck, jaw, ear, or back.  Unexplained heartburn or indigestion.  Shortness of breath.  Nausea or vomiting.  Sudden light-headedness.  Sudden cold sweats.  Fluttering or fast heartbeat (palpitations). How is this diagnosed? This condition is diagnosed based on:  Your family and medical history.  A physical exam.  Tests, including: ? A test to check the electrical signals in your heart (electrocardiogram). ? Exercise stress test. This looks for signs of blockage when the heart is stressed with exercise, such as running on a treadmill. ? Pharmacologic stress test. This test looks for signs of blockage when the heart is being stressed with a medicine. ? Blood tests. ? Coronary angiogram. This is a procedure to look at the coronary arteries to see if there is any blockage. During this test, a dye is injected into your arteries so they appear on an X-ray. ? Coronary artery CT scan. This CT scan helps detect calcium deposits in your coronary arteries. Calcium deposits are an indicator of CAD. ? A test that uses sound waves to take a picture of your heart (echocardiogram). ? Chest X-ray. How is this treated? This condition may be treated by:  Healthy lifestyle changes to reduce risk factors.  Medicines such as: ? Antiplatelet medicines and blood-thinning medicines, such  as aspirin. These help to prevent blood clots. ? Nitroglycerin. ? Blood pressure medicines. ? Cholesterol-lowering medicine.  Coronary angioplasty and stenting. During this procedure, a thin, flexible tube is inserted through a blood vessel and into a blocked artery. A balloon or similar device on the end of the tube is inflated to open up the artery. In some cases, a small,  mesh tube (stent) is inserted into the artery to keep it open.  Coronary artery bypass surgery. During this surgery, veins or arteries from other parts of the body are used to create a bypass around the blockage and allow blood to reach your heart. Follow these instructions at home: Medicines  Take over-the-counter and prescription medicines only as told by your health care provider.  Do not take the following medicines unless your health care provider approves: ? NSAIDs, such as ibuprofen, naproxen, or celecoxib. ? Vitamin supplements that contain vitamin A, vitamin E, or both. Lifestyle  Follow an exercise program approved by your health care provider. Aim for 150 minutes of moderate exercise or 75 minutes of vigorous exercise each week.  Maintain a healthy weight or lose weight as approved by your health care provider.  Learn to manage stress or try to limit your stress. Ask your health care provider for suggestions if you need help.  Get screened for depression and seek treatment, if needed.  Do not use any products that contain nicotine or tobacco, such as cigarettes, e-cigarettes, and chewing tobacco. If you need help quitting, ask your health care provider.  Do not use illegal drugs. Eating and drinking   Follow a heart-healthy diet. A dietitian can help educate you about healthy food options and changes. In general, eat plenty of fruits and vegetables, lean meats, and whole grains.  Avoid foods high in: ? Sugar. ? Salt (sodium). ? Saturated fat, such as processed or fatty meat. ? Trans fat, such as fried foods.  Use healthy cooking methods such as roasting, grilling, broiling, baking, poaching, steaming, or stir-frying.  Do not drink alcohol if your health care provider tells you not to drink.  If you drink alcohol: ? Limit how much you have to 0-2 drinks per day. ? Be aware of how much alcohol is in your drink. In the [place] one drink equals one 12 oz bottle of beer  (355 mL), one 5 oz glass of wine (148 mL), or one 1 oz glass of hard liquor (44 mL). General instructions  Manage any other health conditions, such as hypertension and diabetes. These conditions affect your heart.  Your health care provider may ask you to monitor your blood pressure. Ideally, your blood pressure should be below 130/80.  Keep all follow-up visits as told by your health care provider. This is important. Get help right away if:  You have pain in your chest, neck, ear, arm, jaw, stomach, or back that: ? Lasts more than a few minutes. ? Is recurring. ? Is not relieved by taking medicine under your tongue (sublingual nitroglycerin).  You have profuse sweating without cause.  You have unexplained: ? Heartburn or indigestion. ? Shortness of breath or difficulty breathing. ? Fluttering or fast heartbeat (palpitations). ? Nausea or vomiting. ? Fatigue. ? Feelings of nervousness or anxiety. ? Weakness. ? Diarrhea.  You have sudden light-headedness or dizziness.  You faint.  You feel like hurting yourself or think about taking your own life. These symptoms may represent a serious problem that is an emergency. Do not wait to see if the  symptoms will go away. Get medical help right away. Call your local emergency services (911 in the [place] Do not drive yourself to the hospital. Summary  Coronary artery disease (CAD) is a condition in which the arteries that lead to the heart (coronary arteries) become narrow or blocked. The narrowing or blockage can lead to a heart attack.  Many people do not have any symptoms during the early stages of CAD.  CAD can be treated with lifestyle changes, medicines, surgery, or a combination of these treatments. This information is not intended to replace advice given to you by your health care provider. Make sure you discuss any questions you have with your health care provider. Document Released: 06/26/2014 Document Revised: 08/18/2018  Document Reviewed: 08/08/2018 Elsevier Patient Education  2020 [employer]  This sheet gives you information about how to care for yourself after your procedure. Your health care provider may also give you more specific instructions. If you have problems or questions, contact your health care provider. What can I expect after the procedure? After the procedure, it is common to have: Bruising and tenderness at the catheter insertion area. Follow these instructions at home: Medicines Take over-the-counter and prescription medicines only as told by your health care provider. Insertion site care Follow instructions from your health care provider about how to take care of your insertion site. Make sure you: Wash your hands with soap and water before you change your bandage (dressing). If soap and water are not available, use hand sanitizer. Change your dressing as told by your health care provider. Leave stitches (sutures), skin glue, or adhesive strips in place. These skin closures may need to stay in place for 2 weeks or longer. If adhesive strip edges start to loosen and curl up, you may trim the loose edges. Do not remove adhesive strips completely unless your health care provider tells you to do that. Check your insertion site every day for signs of infection. Check for: Redness, swelling, or pain. Fluid or blood. Pus or a bad smell. Warmth. Do not take baths, swim, or use a hot tub until your health care provider approves. You may shower 24-48 hours after the procedure, or as directed by your health care provider. Remove the dressing and gently wash the site with plain soap and water. Pat the area dry with a clean towel. Do not rub the site. That could cause bleeding. Do not apply powder or lotion to the site. Activity  For 24 hours after the procedure, or as directed by your health care provider: Do not flex or bend the affected arm. Do not push or pull  heavy objects with the affected arm. Do not drive yourself home from the hospital or clinic. You may drive 24 hours after the procedure unless your health care provider tells you not to. Do not operate machinery or power tools. Do not lift anything that is heavier than 10 lb (4.5 kg), or the limit that you are told, until your health care provider says that it is safe. Ask your health care provider when it is okay to: Return to work or school. Resume usual physical activities or sports. Resume sexual activity. General instructions If the catheter site starts to bleed, raise your arm and put firm pressure on the site. If the bleeding does not stop, get help right away. This is a medical emergency. If you went home on the same day as your procedure, a responsible adult should be  with you for the first 24 hours after you arrive home. Keep all follow-up visits as told by your health care provider. This is important. Contact a health care provider if: You have a fever. You have redness, swelling, or yellow drainage around your insertion site. Get help right away if: You have unusual pain at the radial site. The catheter insertion area swells very fast. The insertion area is bleeding, and the bleeding does not stop when you hold steady pressure on the area. Your arm or hand becomes pale, cool, tingly, or numb. These symptoms may represent a serious problem that is an emergency. Do not wait to see if the symptoms will go away. Get medical help right away. Call your local emergency services (911 in the [place] Do not drive yourself to the hospital. Summary After the procedure, it is common to have bruising and tenderness at the site. Follow instructions from your health care provider about how to take care of your radial site wound. Check the wound every day for signs of infection. Do not lift anything that is heavier than 10 lb (4.5 kg), or the limit that you are told, until your health care provider  says that it is safe. This information is not intended to replace advice given to you by your health care provider. Make sure you discuss any questions you have with your health care provider. Document Released: 01/01/2011 Document Revised: 01/04/2018 Document Reviewed: 01/04/2018 Elsevier Patient Education  2020 [employer].    Diabetes Mellitus and Nutrition, Adult When you have diabetes (diabetes mellitus), it is very important to have healthy eating habits because your blood sugar (glucose) levels are greatly affected by what you eat and drink. Eating healthy foods in the appropriate amounts, at about the same times every day, can help you:  Control your blood glucose.  Lower your risk of heart disease.  Improve your blood pressure.  Reach or maintain a healthy weight. Every person with diabetes is different, and each person has different needs for a meal plan. Your health care provider may recommend that you work with a diet and nutrition specialist (dietitian) to make a meal plan that is best for you. Your meal plan may vary depending on factors such as:  The calories you need.  The medicines you take.  Your weight.  Your blood glucose, blood pressure, and cholesterol levels.  Your activity level.  Other health conditions you have, such as heart or kidney disease. How do carbohydrates affect me? Carbohydrates, also called carbs, affect your blood glucose level more than any other type of food. Eating carbs naturally raises the amount of glucose in your blood. Carb counting is a method for keeping track of how many carbs you eat. Counting carbs is important to keep your blood glucose at a healthy level, especially if you use insulin or take certain oral diabetes medicines. It is important to know how many carbs you can safely have in each meal. This is different for every person. Your dietitian can help you calculate how many carbs you should have at each meal and for each  snack. Foods that contain carbs include:  Bread, cereal, rice, pasta, and crackers.  Potatoes and corn.  Peas, beans, and lentils.  Milk and yogurt.  Fruit and juice.  Desserts, such as cakes, cookies, ice cream, and candy. How does alcohol affect me? Alcohol can cause a sudden decrease in blood glucose (hypoglycemia), especially if you use insulin or take certain oral diabetes medicines. Hypoglycemia  can be a life-threatening condition. Symptoms of hypoglycemia (sleepiness, dizziness, and confusion) are similar to symptoms of having too much alcohol. If your health care provider says that alcohol is safe for you, follow these guidelines:  Limit alcohol intake to no more than 1 drink per day for nonpregnant women and 2 drinks per day for men. One drink equals 12 oz of beer, 5 oz of wine, or 1 oz of hard liquor.  Do not drink on an empty stomach.  Keep yourself hydrated with water, diet soda, or unsweetened iced tea.  Keep in mind that regular soda, juice, and other mixers may contain a lot of sugar and must be counted as carbs. What are tips for following this plan?  Reading food labels  Start by checking the serving size on the "Nutrition Facts" label of packaged foods and drinks. The amount of calories, carbs, fats, and other nutrients listed on the label is based on one serving of the item. Many items contain more than one serving per package.  Check the total grams (g) of carbs in one serving. You can calculate the number of servings of carbs in one serving by dividing the total carbs by 15. For example, if a food has 30 g of total carbs, it would be equal to 2 servings of carbs.  Check the number of grams (g) of saturated and trans fats in one serving. Choose foods that have low or no amount of these fats.  Check the number of milligrams (mg) of salt (sodium) in one serving. Most people should limit total sodium intake to less than 2,300 mg per day.  Always check the  nutrition information of foods labeled as "low-fat" or "nonfat". These foods may be higher in added sugar or refined carbs and should be avoided.  Talk to your dietitian to identify your daily goals for nutrients listed on the label. Shopping  Avoid buying canned, premade, or processed foods. These foods tend to be high in fat, sodium, and added sugar.  Shop around the outside edge of the grocery store. This includes fresh fruits and vegetables, bulk grains, fresh meats, and fresh dairy. Cooking  Use low-heat cooking methods, such as baking, instead of high-heat cooking methods like deep frying.  Cook using healthy oils, such as olive, canola, or sunflower oil.  Avoid cooking with butter, cream, or high-fat meats. Meal planning  Eat meals and snacks regularly, preferably at the same times every day. Avoid going long periods of time without eating.  Eat foods high in fiber, such as fresh fruits, vegetables, beans, and whole grains. Talk to your dietitian about how many servings of carbs you can eat at each meal.  Eat 4-6 ounces (oz) of lean protein each day, such as lean meat, chicken, fish, eggs, or tofu. One oz of lean protein is equal to: ? 1 oz of meat, chicken, or fish. ? 1 egg. ?  cup of tofu.  Eat some foods each day that contain healthy fats, such as avocado, nuts, seeds, and fish. Lifestyle  Check your blood glucose regularly.  Exercise regularly as told by your health care provider. This may include: ? 150 minutes of moderate-intensity or vigorous-intensity exercise each week. This could be brisk walking, biking, or water aerobics. ? Stretching and doing strength exercises, such as yoga or weightlifting, at least 2 times a week.  Take medicines as told by your health care provider.  Do not use any products that contain nicotine or tobacco, such as cigarettes  and e-cigarettes. If you need help quitting, ask your health care provider.  Work with a Social worker or diabetes  educator to identify strategies to manage stress and any emotional and social challenges. Questions to ask a health care provider  Do I need to meet with a diabetes educator?  Do I need to meet with a dietitian?  What number can I call if I have questions?  When are the best times to check my blood glucose? Where to find more information:  American Diabetes Association: diabetes.org  Academy of Nutrition and Dietetics: www.eatright.[employer] of Diabetes and Digestive and Kidney Diseases (NIH): [employer] Summary  A healthy meal plan will help you control your blood glucose and maintain a healthy lifestyle.  Working with a diet and nutrition specialist (dietitian) can help you make a meal plan that is best for you.  Keep in mind that carbohydrates (carbs) and alcohol have immediate effects on your blood glucose levels. It is important to count carbs and to use alcohol carefully. This information is not intended to replace advice given to you by your health care provider. Make sure you discuss any questions you have with your health care provider. Document Released: 08/26/2005 Document Revised: 11/11/2017 Document Reviewed: 01/03/2017 Elsevier Patient Education  2020 [employer].

## 2019-08-28 ENCOUNTER — Encounter: Payer: Self-pay | Admitting: Family

## 2019-08-28 ENCOUNTER — Ambulatory Visit (INDEPENDENT_AMBULATORY_CARE_PROVIDER_SITE_OTHER): Payer: Medicaid Other | Admitting: Family

## 2019-08-28 ENCOUNTER — Telehealth (HOSPITAL_COMMUNITY): Payer: Self-pay

## 2019-08-28 DIAGNOSIS — E1142 Type 2 diabetes mellitus with diabetic polyneuropathy: Secondary | ICD-10-CM | POA: Diagnosis not present

## 2019-08-28 DIAGNOSIS — Z89431 Acquired absence of right foot: Secondary | ICD-10-CM | POA: Diagnosis not present

## 2019-08-28 NOTE — Telephone Encounter (Signed)
Called patient to see if he is interested in the Cardiac Rehab Program. Patient expressed interest. Explained scheduling process and went over insurance, patient verbalized understanding. Will contact patient for scheduling once f/u has been completed.  °

## 2019-08-28 NOTE — Telephone Encounter (Signed)
Pt insurance is active through [place]. XOV#[id number]  Will fax over Medicaid Reimbursement form to Dr. Claiborne Billings  Will contact pt to see if he is interested in the Cardiac Rehab program. If interested, pt will need to complete f/u appt on. Once completed, pt will be contacted for scheduling upon review by the RN Navigator.

## 2019-08-28 NOTE — Progress Notes (Signed)
Office Visit Note   Patient: Joshua [name]           Date of Birth: 10-20-1956           MRN: [id number] Visit Date: 08/28/2019              Requested by: [name], MD [place],  [place] [id number] PCP: [name], MD  No chief complaint on file.     HPI: The patient is a 63 year old gentleman seen today for evaluation of right foot. Is status post transmetatarsal amputation in 2017.  He has not been using any assistive devices or orthotics.  He comes in today requesting a prosthetic of the right foot.  He has had difficulty ambulating feels unsteady on his feet.  He states he is unable to do stairs due to instability.  Assessment & Plan: Visit Diagnoses: No diagnosis found.  Plan: Discussed different orthotic options.  Have provided an order for a custom orthotic with a carbon fiber plate including a spacer did send this over to [facility] today.  He may benefit from a double upright brace as well.  Follow-Up Instructions: Return if symptoms worsen or fail to improve.   Ortho Exam  Patient is alert, oriented, no adenopathy, well-dressed, normal affect, normal respiratory effort. On examination of the right foot there is no erythema no edema no callus no impending breakdown.  His transmetatarsal amputation is well-healed.  Imaging: No results found. No images are attached to the encounter.  Labs: Lab Results  Component Value Date   HGBA1C 10.2 (H) 08/16/2019   HGBA1C 15.0 (H) 04/07/2019   HGBA1C >15.5 (H) 03/15/2018   ESRSEDRATE 27 (H) 11/01/2016   ESRSEDRATE 44 (H) 09/08/2016   ESRSEDRATE 131 (H) 07/24/2016   CRP 4.8 11/01/2016   CRP 2.0 09/08/2016   CRP 28.9 (H) 07/23/2016   REPTSTATUS 03/20/2018 FINAL 03/14/2018   GRAMSTAIN  10/30/2013    FEW WBC PRESENT,BOTH PMN AND MONONUCLEAR NO ORGANISMS SEEN Gram Stain Report Called to,Read Back By and Verified With: PLATT,J. AT 1914 ON 11.18.14 BY LOVE,T.   GRAMSTAIN  10/30/2013    FEW WBC  PRESENT, PREDOMINANTLY PMN NO SQUAMOUS EPITHELIAL CELLS SEEN NO ORGANISMS SEEN Performed at [facility]  10/30/2013    FEW WBC PRESENT, PREDOMINANTLY PMN NO SQUAMOUS EPITHELIAL CELLS SEEN NO ORGANISMS SEEN Performed at [facility]  03/14/2018    NO GROWTH 5 DAYS Performed at [facility], [place] [address] [place], [place] [address]    LABORGA KLEBSIELLA PNEUMONIAE 10/30/2013     Lab Results  Component Value Date   ALBUMIN 3.3 (L) 08/17/2019   ALBUMIN 3.7 04/06/2019   ALBUMIN 2.0 (L) 03/22/2018   PREALBUMIN 8.5 (L) 07/24/2016    Lab Results  Component Value Date   MG 2.3 03/20/2018   Lab Results  Component Value Date   VD25OH 15 (L) 10/30/2013    Lab Results  Component Value Date   PREALBUMIN 8.5 (L) 07/24/2016   CBC EXTENDED Latest Ref Rng & Units 08/23/2019 08/22/2019 08/22/2019  WBC 4.0 - 10.5 K/uL 7.2 - -  RBC 4.22 - 5.81 MIL/uL 4.54 - -  HGB 13.0 - 17.0 g/dL 12.5(L) 12.6(L) 12.6(L)  HCT 39.0 - 52.0 % 39.2 37.0(L) 37.0(L)  PLT 150 - 400 K/uL 167 - -  NEUTROABS 1.7 - 7.7 K/uL 4.6 - -  LYMPHSABS 0.7 - 4.0 K/uL 1.4 - -  There is no height or weight on file to calculate BMI.  Orders:  No orders of the defined types were placed in this encounter.  No orders of the defined types were placed in this encounter.    Procedures: No procedures performed  Clinical Data: No additional findings.  ROS:  All other systems negative, except as noted in the HPI. Review of Systems  Constitutional: Negative for chills and fever.  Skin: Negative for wound.    Objective: Vital Signs: There were no vitals taken for this visit.  Specialty Comments:  No specialty comments available.  PMFS History: Patient Active Problem List   Diagnosis Date Noted  . Unstable angina (HCC) 08/17/2019  . Chest pain 08/16/2019  . NSTEMI (non-ST elevated myocardial infarction) (HCC) 04/06/2019  . ACS (acute coronary syndrome) (HCC)  04/06/2019  . Sepsis (HCC) 03/15/2018  . DKA (diabetic ketoacidoses) (HCC) 03/15/2018  . Special screening for malignant neoplasms, colon 08/22/2017  . Acute renal failure superimposed on stage 3 chronic kidney disease (HCC) 05/19/2017  . Essential hypertension   . Status post transmetatarsal amputation of foot, right (HCC) 11/08/2016  . Diabetic polyneuropathy associated with type 2 diabetes mellitus (HCC) 11/08/2016  . Pure hypercholesterolemia   . AKI (acute kidney injury) (HCC)   . Acute blood loss anemia   . DM type 2 with diabetic peripheral neuropathy (HCC)   . Physical debility 08/03/2016  . Chronic osteomyelitis of right foot (HCC)   . Cellulitis and abscess of leg, except foot   . Penetrating wound of right foot   . CKD (chronic kidney disease) stage 3, GFR 30-59 ml/min (HCC)   . Unstable angina pectoris (HCC) 07/18/2016  . Chronic combined systolic and diastolic congestive heart failure (HCC)   . Chest pain with moderate risk for cardiac etiology 06/24/2014  . CAD S/P multiple PCIs 06/24/2014  . Sleep apnea- on C-pap 06/24/2014  . Morbid obesity due to excess calories (HCC) 11/10/2013  . Hyperglycemia 10/29/2013  . Hyperlipidemia 01/12/2011  . Benign essential HTN 01/12/2011  . GERD 01/12/2011   Past Medical History:  Diagnosis Date  . Anemia   . Chronic combined systolic and diastolic CHF (congestive heart failure) (HCC)    a. 07/2015 Echo: EF 45-50%, mod LVH, mid apical/antsept AK, Gr 1 DD.  [name] CKD (chronic kidney disease), stage III (HCC)   . Coronary artery disease    a. 2012 s/p MI/PCI: s/p DES to mid/dist RCA and overlapping DES to LAD;  b. 07/2015 Cath: LAD 10% ISR, D1 40(jailed), LCX 66m, OM2 30, OM3 30, RCA 41m ISR, 10d ISR; c. 07/2016 NSTEMI/PCI: LAD 85ost/p/m (3.5x20 Synergy DES overlapping prior stent), D1 40, LCX 57m, OM2 95 (2.75x16 Synergy DES), OM3 30, RCA 72m, 10d.  . Depression   . Diabetic gastroparesis (HCC)   . DKA (diabetic ketoacidoses) (HCC)  03/14/2018  . GERD (gastroesophageal reflux disease)   . Gout   . Heart murmur   . Hyperlipemia   . Hypertensive heart disease   . Ischemic cardiomyopathy    a. 07/2015 Echo: EF 45-50%, mod LVH, mid-apicalanteroseptal AK, Gr1 DD.  . Morbid obesity (HCC)   . OSA on CPAP   . Osteomyelitis (HCC)    a. 07/2016 MTP joint of R great toe s/p transmetatarsal amputation.  . Polyneuropathy in diabetes(357.2)   . Pulmonary sarcoidosis (HCC)    "problems w/it years ago; no problems anymore" (07/03/2014)  . Rectal bleeding   . Sleep apnea   . Type II diabetes mellitus (HCC)  Family History  Problem Relation Age of Onset  . Heart attack Maternal Aunt   . Diabetes Maternal Grandmother   . Hypertension Maternal Grandmother     Past Surgical History:  Procedure Laterality Date  . AMPUTATION Right 07/24/2016   Procedure: TRANSMETATARSAL FOOT AMPUTATION;  Surgeon: [name], MD;  Location: [facility];  Service: Orthopedics;  Laterality: Right;  . BALLOON DILATION N/A 03/21/2018   Procedure: BALLOON DILATION;  Surgeon: [name], MD;  Location: [facility] ENDOSCOPY;  Service: Endoscopy;  Laterality: N/A;  . BREAST LUMPECTOMY Right ~ 2000   "benign"  . CARDIAC CATHETERIZATION N/A 07/30/2015   Procedure: Left Heart Cath and Coronary Angiography;  Surgeon: Burnell Blanks, MD;  Location: [place] [facility] LAB;  Service: Cardiovascular;  Laterality: N/A;  . CARDIAC CATHETERIZATION N/A 07/19/2016   Procedure: Left Heart Cath and Coronary Angiography;  Surgeon: Belva Crome, MD;  Location: [place] LAB;  Service: Cardiovascular;  Laterality: N/A;  . CARDIAC CATHETERIZATION N/A 07/29/2016   Procedure: Coronary Stent Intervention;  Surgeon: Belva Crome, MD;  Location: [facility];  Service: Cardiovascular;  Laterality: N/A;  . CARDIAC CATHETERIZATION N/A 07/29/2016   Procedure: Intravascular Ultrasound/IVUS;  Surgeon: Belva Crome, MD;  Location: [place] [facility];  Service: Cardiovascular;   Laterality: N/A;  . CATARACT EXTRACTION W/ INTRAOCULAR LENS  IMPLANT, BILATERAL Bilateral 2014-2015  . COLONOSCOPY WITH PROPOFOL N/A 08/22/2017   Procedure: COLONOSCOPY WITH PROPOFOL;  Surgeon: [name], MD;  Location: [facility];  Service: Endoscopy;  Laterality: N/A;  . CORONARY ANGIOPLASTY WITH STENT PLACEMENT  10/31/2011   30 % MID ATRIOVENTRICULAR GROOVE CX STENOSIS. 90 % MID RCA.PTA TO LAD/STENTING OF TANDEM 60-70%. LAD STENOSIS 99% REDUCED TO 0%.3.0 X 32MM PROMUS DES POSTDILATED TO 3.25MM.  [name] CORONARY ANGIOPLASTY WITH STENT PLACEMENT  07/03/2014   "2"  . CORONARY STENT INTERVENTION N/A 04/09/2019   Procedure: CORONARY STENT INTERVENTION;  Surgeon: Jettie Booze, MD;  Location: [facility];  Service: Cardiovascular;  Laterality: N/A;  . CORONARY STENT INTERVENTION N/A 08/22/2019   Procedure: CORONARY STENT INTERVENTION;  Surgeon: Martinique, Peter M, MD;  Location: [place] [facility];  Service: Cardiovascular;  Laterality: N/A;  . ESOPHAGOGASTRODUODENOSCOPY (EGD) WITH PROPOFOL N/A 03/21/2018   Procedure: ESOPHAGOGASTRODUODENOSCOPY (EGD) WITH PROPOFOL;  Surgeon: [name], MD;  Location: [facility];  Service: Endoscopy;  Laterality: N/A;  . I&D EXTREMITY Right 10/30/2013   Procedure: IRRIGATION AND DEBRIDEMENT RIGHT SMALL FINGER POSSIBLE SECONDARY WOUND CLOSURE;  Surgeon: [name], MD;  Location: [facility];  Service: Orthopedics;  Laterality: Right;  [name] is available after 4pm  . I&D EXTREMITY Right 11/01/2013   Procedure:  IRRIGATION AND DEBRIDEMENT RIGHT  PROXIMAL PHALANGEAL LEVEL AMPUTATION;  Surgeon: [name], MD;  Location: [facility];  Service: Orthopedics;  Laterality: Right;  . INCISION AND DRAINAGE Right 10/28/2013   Procedure: INCISION AND DRAINAGE Right small finger;  Surgeon: [name], MD;  Location: [facility];  Service: Orthopedics;  Laterality: Right;  . LEFT HEART CATH Left 11/03/2011   Procedure: LEFT HEART CATH;  Surgeon:  [name], MD;  Location: [facility] CATH LAB;  Service: Cardiovascular;  Laterality: Left;  . LEFT HEART CATH AND CORONARY ANGIOGRAPHY N/A 04/09/2019   Procedure: LEFT HEART CATH AND CORONARY ANGIOGRAPHY;  Surgeon: Jettie Booze, MD;  Location: [facility];  Service: Cardiovascular;  Laterality: N/A;  . LEFT HEART CATHETERIZATION WITH CORONARY ANGIOGRAM N/A 07/03/2014   Procedure: LEFT  HEART CATHETERIZATION WITH CORONARY ANGIOGRAM;  Surgeon: [name], MD;  Location: [facility] C[facility] LAB;  Service: Cardiovascular;  Laterality: N/A;  . PERCUTANEOUS CORONARY STENT INTERVENTION (PCI-S) Left 11/03/2011   Procedure: PERCUTANEOUS CORONARY STENT INTERVENTION (PCI-S);  Surgeon: [name], MD;  Location: [facility] CATH LAB;  Service: Cardiovascular;  Laterality: Left;  . RIGHT/LEFT HEART CATH AND CORONARY ANGIOGRAPHY N/A 08/22/2019   Procedure: RIGHT/LEFT HEART CATH AND CORONARY ANGIOGRAPHY;  Surgeon: Martinique, Peter M, MD;  Location: [place] [facility];  Service: Cardiovascular;  Laterality: N/A;  . TOE AMPUTATION Right ~ 2010   "2nd toe"  . TOE AMPUTATION Right 2012   "3rd toe"   Social History   Occupational History  . Not on file  Tobacco Use  . Smoking status: Never Smoker  . Smokeless tobacco: Never Used  Substance and Sexual Activity  . Alcohol use: Yes    Comment: social   . Drug use: No  . Sexual activity: Yes

## 2019-09-10 ENCOUNTER — Telehealth: Payer: Self-pay | Admitting: Family

## 2019-09-10 NOTE — Telephone Encounter (Signed)
Patient called. He forgot the RX to give to [facility] for R foot. He would like the RX faxed to [place]. [phone] is the fax number.

## 2019-09-10 NOTE — Telephone Encounter (Signed)
Patient Rx is sent to [facility] clinic [phone] on 09/10/2019

## 2019-09-11 ENCOUNTER — Ambulatory Visit: Payer: Medicaid Other | Admitting: Physician Assistant

## 2019-09-11 ENCOUNTER — Encounter: Payer: Self-pay | Admitting: Physician Assistant

## 2019-09-11 ENCOUNTER — Other Ambulatory Visit: Payer: Self-pay

## 2019-09-11 VITALS — BP 125/79 | HR 74 | Temp 95.5°F | Ht 69.0 in | Wt 310.2 lb

## 2019-09-11 DIAGNOSIS — Z794 Long term (current) use of insulin: Secondary | ICD-10-CM

## 2019-09-11 DIAGNOSIS — I10 Essential (primary) hypertension: Secondary | ICD-10-CM | POA: Diagnosis not present

## 2019-09-11 DIAGNOSIS — N184 Chronic kidney disease, stage 4 (severe): Secondary | ICD-10-CM | POA: Diagnosis not present

## 2019-09-11 DIAGNOSIS — I2510 Atherosclerotic heart disease of native coronary artery without angina pectoris: Secondary | ICD-10-CM

## 2019-09-11 DIAGNOSIS — E119 Type 2 diabetes mellitus without complications: Secondary | ICD-10-CM

## 2019-09-11 DIAGNOSIS — E785 Hyperlipidemia, unspecified: Secondary | ICD-10-CM | POA: Diagnosis not present

## 2019-09-11 NOTE — Patient Instructions (Addendum)
Medication Instructions:   Continue to take Lasix 80 mg daily  If you need a refill on your cardiac medications before your next appointment, please call your pharmacy.   Lab work: NONE ordered at this time of appointment. Labs will be deferred to your Nephrologist   If you have labs (blood work) drawn today and your tests are completely normal, you will receive your results only by: [name] MyChart Message (if you have MyChart) OR . A paper copy in the mail If you have any lab test that is abnormal or we need to change your treatment, we will call you to review the results.  Testing/Procedures: NONE ordered at this time of appointment   Follow-Up: At [facility], you and your health needs are our priority.  As part of our continuing mission to provide you with exceptional heart care, we have created designated Provider Care Teams.  These Care Teams include your primary Cardiologist (physician) and Advanced Practice Providers (APPs -  Physician Assistants and Nurse Practitioners) who all work together to provide you with the care you need, when you need it. You will need a follow up appointment in 3-4 months with [name], MD.  Any Other Special Instructions Will Be Listed Below (If Applicable).

## 2019-09-11 NOTE — Progress Notes (Signed)
Cardiology Office Note    Date:  09/13/2019   ID:  Joshua [name], DOB December 20, 1955, MRN [id number]  PCP:  [name], MD  Cardiologist:  Dr. Claiborne Billings   Chief Complaint  Patient presents with  . Follow-up    seen for Dr. Claiborne Billings.    History of Present Illness:  Joshua [name] is a 63 y.o. male with PMH ofCAD, CKD stage III, DM 2 with history of DKA, HTN, HLD,OSAon CPAP and history of pulmonary sarcoidosis. He had anterior lateral STEMI in November 2012 requiring 3.0 x 32 mm Promus DES placed in the LAD, he subsequently had staged PCI of mid RCA with another drug-eluting stent. He had another cardiac catheterization in August 2016, LAD stent was patent, there was 40% D1 lesion, 60% lesion in left circumflex artery, 50% in-stent restenosis in the RCA. Echocardiogram obtained at the time showed EF 45 to 50% with moderate LVH, grade 1 DD. He was placed on Protonix for management of GERD. He was readmitted in August 2017 with NSTEMI, repeat cardiac catheterization showed new proximal LAD lesion and 95% OM1 lesion. He underwent another stenting of LAD and left circumflex vessel with synergy stents. He was readmitted in October 2017 with atypical chest pain. Myoview at the time showed EF 39% without ischemia.   I previously saw the patient in December 2019 at which time he complained of exertional chest discomfort.  I recommend a cardiac catheterization however his renal function was down.  Cardiac catheterization was delayed.  I stopped his diuretic however renal function continues to be poor.  I discussed the case with Dr. Claiborne Billings and we eventually decided to use a Lexiscan Myoview to risk stratify.  Myoview obtained on 12/26/2018 showed EF 33%, medium sized moderate intensity severe fixed septal and anterior septal perfusion defect which could represent scar versus artifact, no significant reversible ischemia was noted.  We decided to medically manage his discomfort.  He was admitted for  NSTEMI in April 2020.  He eventually underwent repeat cardiac catheterization on 04/09/2019 which noted a 90% ostial to proximal LAD in-stent restenosis treated with balloon angioplasty.  Echocardiogram obtained on 04/07/2019 showed EF 55 to 60%.  Patient returned with recurrent chest pain in early September 2020.  Repeat cardiac catheterization demonstrated 95% proximal LAD lesion treated with a 3.5 x 20 mm Synergy DES, 50% mid left circumflex residual, 40% ostial D1 lesion.  Since discharge, he has not had any further chest discomfort.  He is being followed by nephrology service, [name].  I will defer to nephrology service to continue follow-up with his renal function.  We discussed the importance of compliance with aspirin and Brilinta therapy.  Recent lab work shows cholesterol is very well controlled.  He does not have any lower extremity edema, orthopnea or PND.  He says that since he was discharged, he has been taking 80 mg daily of Lasix instead of the 40 mg he had in the hospital.  According to the patient, it was his nephrologist office for increased his diuretic to 80 mg prior to the hospitalization and that he has continued that dose since.  I will defer the dosing of diuretic to his nephrologist.  Past Medical History:  Diagnosis Date  . Anemia   . Chronic combined systolic and diastolic CHF (congestive heart failure) ([place])    a. 07/2015 Echo: EF 45-50%, mod LVH, mid apical/antsept AK, Gr 1 DD.  [name] CKD (chronic kidney disease), stage III   . Coronary artery  disease    a. 2012 s/p MI/PCI: s/p DES to mid/dist RCA and overlapping DES to LAD;  b. 07/2015 Cath: LAD 10% ISR, D1 40(jailed), LCX 75m OM2 30, OM3 30, RCA 567mSR, 10d ISR; c. 07/2016 NSTEMI/PCI: LAD 85ost/p/m (3.5x20 Synergy DES overlapping prior stent), D1 40, LCX 7534mM2 95 (2.75x16 Synergy DES), OM3 30, RCA 79m73md.  . Depression   . Diabetic gastroparesis (HCC)[place]. DKA (diabetic ketoacidoses) (HCC)[place]/01/2018  . GERD  (gastroesophageal reflux disease)   . Gout   . Heart murmur   . Hyperlipemia   . Hypertensive heart disease   . Ischemic cardiomyopathy    a. 07/2015 Echo: EF 45-50%, mod LVH, mid-apicalanteroseptal AK, Gr1 DD.  . Morbid obesity (HCC)[place]. OSA on CPAP   . Osteomyelitis (HCC)[place] a. 07/2016 MTP joint of R great toe s/p transmetatarsal amputation.  . Polyneuropathy in diabetes(357.2)   . Pulmonary sarcoidosis (HCC)[place] "problems w/it years ago; no problems anymore" (07/03/2014)  . Rectal bleeding   . Sleep apnea   . Type II diabetes mellitus (HCC)[place]  Past Surgical History:  Procedure Laterality Date  . AMPUTATION Right 07/24/2016   Procedure: TRANSMETATARSAL FOOT AMPUTATION;  Surgeon: MarcNewt Minion;  [facility] [place];  Laterality: Right;  . BALLOON DILATION N/A 03/21/2018   Procedure: BALLOON DILATION;  Surgeon: BuccRonald Lobo;  [facility];  Service: Endoscopy;  Laterality: N/A;  . BREAST LUMPECTOMY Right ~ 2000   "benign"  . CARDIAC CATHETERIZATION N/A 07/30/2015   Procedure: Left Heart Cath and Coronary Angiography;  Surgeon: ChriBurnell Blanks;  [facility];  Service: Cardiovascular;  Laterality: N/A;  . CARDIAC CATHETERIZATION N/A 07/19/2016   Procedure: Left Heart Cath and Coronary Angiography;  Surgeon: HenrBelva Crome;  [facility];  Service: Cardiovascular;  Laterality: N/A;  . CARDIAC CATHETERIZATION N/A 07/29/2016   Procedure: Coronary Stent Intervention;  Surgeon: HenrBelva Crome;  [facility];  Service: Cardiovascular;  Laterality: N/A;  . CARDIAC CATHETERIZATION N/A 07/29/2016   Procedure: Intravascular Ultrasound/IVUS;  Surgeon: HenrBelva Crome;  [facility];  Service: Cardiovascular;  Laterality: N/A;  . CATARACT EXTRACTION W/ INTRAOCULAR LENS  IMPLANT, BILATERAL Bilateral 2014-2015  . COLONOSCOPY WITH PROPOFOL N/A 08/22/2017   Procedure: COLONOSCOPY WITH  PROPOFOL;  Surgeon: SchoWilford Corner;  [facility];  Laterality: N/A;  . CORONARY ANGIOPLASTY WITH STENT PLACEMENT  10/31/2011   30 % MID ATRIOVENTRICULAR GROOVE CX STENOSIS. 90 % MID RCA.PTA TO LAD/STENTING OF TANDEM 60-70%. LAD STENOSIS 99% REDUCED TO 0%.3.0 X 32MM PROMUS DES POSTDILATED TO 3.25MM.  . COMarland KitchenONARY ANGIOPLASTY WITH STENT PLACEMENT  07/03/2014   "2"  . CORONARY STENT INTERVENTION N/A 04/09/2019   Procedure: CORONARY STENT INTERVENTION;  Surgeon: VaraJettie Booze;  [facility] [place];  Service: Cardiovascular;  Laterality: N/A;  . CORONARY STENT INTERVENTION N/A 08/22/2019   Procedure: CORONARY STENT INTERVENTION;  Surgeon: JordMartiniqueter M, MD;  [facility];  Service: Cardiovascular;  Laterality: N/A;  . ESOPHAGOGASTRODUODENOSCOPY (EGD) WITH PROPOFOL N/A 03/21/2018   Procedure: ESOPHAGOGASTRODUODENOSCOPY (EGD) WITH PROPOFOL;  Surgeon: BuccRonald Lobo;  [facility];  Laterality: N/A;  . I&D EXTREMITY Right 10/30/2013   Procedure: IRRIGATION AND DEBRIDEMENT RIGHT SMALL FINGER POSSIBLE SECONDARY WOUND CLOSURE;  Surgeon: MattRodman Key  Roque Cash, MD;  Location: [facility];  Service: Orthopedics;  Laterality: Right;  [name] is available after 4pm  . I&D EXTREMITY Right 11/01/2013   Procedure:  IRRIGATION AND DEBRIDEMENT RIGHT  PROXIMAL PHALANGEAL LEVEL AMPUTATION;  Surgeon: [name], MD;  Location: [facility];  Service: Orthopedics;  Laterality: Right;  . INCISION AND DRAINAGE Right 10/28/2013   Procedure: INCISION AND DRAINAGE Right small finger;  Surgeon: [name], MD;  Location: [facility];  Service: Orthopedics;  Laterality: Right;  . LEFT HEART CATH Left 11/03/2011   Procedure: LEFT HEART CATH;  Surgeon: [name], MD;  Location: [facility] CATH LAB;  Service: Cardiovascular;  Laterality: Left;  . LEFT HEART CATH AND CORONARY ANGIOGRAPHY N/A 04/09/2019   Procedure: LEFT HEART CATH AND  CORONARY ANGIOGRAPHY;  Surgeon: Jettie Booze, MD;  Location: [place] CV LAB;  Service: Cardiovascular;  Laterality: N/A;  . LEFT HEART CATHETERIZATION WITH CORONARY ANGIOGRAM N/A 07/03/2014   Procedure: LEFT HEART CATHETERIZATION WITH CORONARY ANGIOGRAM;  Surgeon: [name], MD;  Location: [facility] CATH LAB;  Service: Cardiovascular;  Laterality: N/A;  . PERCUTANEOUS CORONARY STENT INTERVENTION (PCI-S) Left 11/03/2011   Procedure: PERCUTANEOUS CORONARY STENT INTERVENTION (PCI-S);  Surgeon: [name], MD;  Location: [facility] CATH LAB;  Service: Cardiovascular;  Laterality: Left;  . RIGHT/LEFT HEART CATH AND CORONARY ANGIOGRAPHY N/A 08/22/2019   Procedure: RIGHT/LEFT HEART CATH AND CORONARY ANGIOGRAPHY;  Surgeon: Martinique, Peter M, MD;  Location: [place] CV LAB;  Service: Cardiovascular;  Laterality: N/A;  . TOE AMPUTATION Right ~ 2010   "2nd toe"  . TOE AMPUTATION Right 2012   "3rd toe"    Current Medications: Outpatient Medications Prior to Visit  Medication Sig Dispense Refill  . Accu-Chek FastClix Lancets MISC USE UP TO FOUR TIMES DAILY AS DIRECTED    . ACCU-CHEK GUIDE test strip USE UP TO FOUR TIMES DAILY AS DIRECTED    . acetaminophen (TYLENOL) 650 MG CR tablet Take 1,300 mg by mouth daily as needed for pain.    [name] allopurinol (ZYLOPRIM) 300 MG tablet Take 300 mg by mouth daily.     [name] amLODipine (NORVASC) 10 MG tablet Take 1 tablet (10 mg total) by mouth daily.    [name] aspirin EC 81 MG tablet Take 81 mg by mouth daily.     . blood glucose meter kit and supplies Dispense based on patient and insurance preference. Use up to four times daily as directed. (FOR ICD-10 E10.9, E11.9). 1 each 0  . Camphor-Menthol-Methyl Sal (HM SALONPAS PAIN RELIEF EX) Apply 1 spray topically daily as needed (pain).    . furosemide (LASIX) 80 MG tablet Take 80 mg by mouth.    . hydrALAZINE (APRESOLINE) 50 MG tablet Take 1 tablet (50 mg total) by mouth 3 (three) times daily. 270 tablet 3  . insulin  aspart (NOVOLOG) 100 UNIT/ML injection Inject 6 Units into the skin 3 (three) times daily with meals. (Patient taking differently: Inject 10-12 Units into the skin 2 (two) times daily. Sliding scale) 10 mL 11  . insulin glargine (LANTUS) 100 UNIT/ML injection Inject 0.2 mLs (20 Units total) into the skin 2 (two) times daily. 10 mL 11  . isosorbide mononitrate (IMDUR) 120 MG 24 hr tablet Take 1 tablet (120 mg total) by mouth 2 (two) times daily.    . metoprolol succinate (TOPROL-XL) 50 MG 24 hr tablet Take 3 tablets (150 mg total) by mouth daily. Take with or immediately following a meal. 90 tablet  0  . nitroGLYCERIN (NITROSTAT) 0.4 MG SL tablet Place 1 tablet (0.4 mg total) under the tongue every 5 (five) minutes as needed for chest pain. 25 tablet 1  . pantoprazole (PROTONIX) 40 MG tablet TAKE 1 TABLET BY MOUTH TWICE DAILY BEFORE MEAL(S) (Patient taking differently: Take 40 mg by mouth daily. ) 180 tablet 0  . rosuvastatin (CRESTOR) 40 MG tablet Take 1 tablet (40 mg total) by mouth daily. 30 tablet 6  . ticagrelor (BRILINTA) 90 MG TABS tablet Take 1 tablet (90 mg total) by mouth 2 (two) times daily. 60 tablet 6  . furosemide (LASIX) 40 MG tablet Take 1 tablet (40 mg total) by mouth daily. 90 tablet 3  . silver sulfADIAZINE (SILVADENE) 1 % cream Apply pea-sized amount to wound daily. (Patient not taking: Reported on 08/16/2019) 50 g 0   No facility-administered medications prior to visit.      Allergies:   Chlorthalidone   Social History   Socioeconomic History  . Marital status: Married    Spouse name: Not on file  . Number of children: Not on file  . Years of education: Not on file  . Highest education level: Not on file  Occupational History  . Not on file  Social Needs  . Financial resource strain: Not on file  . Food insecurity    Worry: Not on file    Inability: Not on file  . Transportation needs    Medical: Not on file    Non-medical: Not on file  Tobacco Use  . Smoking  status: Never Smoker  . Smokeless tobacco: Never Used  Substance and Sexual Activity  . Alcohol use: Yes    Comment: social   . Drug use: No  . Sexual activity: Yes  Lifestyle  . Physical activity    Days per week: Not on file    Minutes per session: Not on file  . Stress: Not on file  Relationships  . Social Herbalist on phone: Not on file    Gets together: Not on file    Attends religious service: Not on file    Active member of club or organization: Not on file    Attends meetings of clubs or organizations: Not on file    Relationship status: Not on file  Other Topics Concern  . Not on file  Social History Narrative  . Not on file     Family History:  The patient's family history includes Diabetes in his maternal grandmother; Heart attack in his maternal aunt; Hypertension in his maternal grandmother.   ROS:   Please see the history of present illness.    ROS All other systems reviewed and are negative.   PHYSICAL EXAM:   VS:  BP 125/79 (BP Location: Right Arm, Patient Position: Sitting, Cuff Size: Large)   Pulse 74   Temp (!) 95.5 F (35.3 C)   Ht '5\' 9"'  (1.753 m)   Wt (!) 310 lb 3.2 oz (140.7 kg)   BMI 45.81 kg/m    GEN: Well nourished, well developed, in no acute distress  HEENT: normal  Neck: no JVD, carotid bruits, or masses Cardiac: RRR; no murmurs, rubs, or gallops,no edema  Respiratory:  clear to auscultation bilaterally, normal work of breathing GI: soft, nontender, nondistended, + BS MS: no deformity or atrophy  Skin: warm and dry, no rash Neuro:  Alert and Oriented x 3, Strength and sensation are intact Psych: euthymic mood, full affect  Wt Readings from  Last 3 Encounters:  09/11/19 (!) 310 lb 3.2 oz (140.7 kg)  08/23/19 (!) 314 lb 11.2 oz (142.7 kg)  04/19/19 (!) 304 lb (137.9 kg)      Studies/Labs Reviewed:   EKG:  EKG is ordered today.  The ekg ordered today demonstrates normal sinus rhythm, poor R wave progression anterior  leads.  Recent Labs: 08/16/2019: B Natriuretic Peptide 81.1; TSH 1.675 08/17/2019: ALT 17 08/23/2019: BUN 35; Creatinine, Ser 2.78; Hemoglobin 12.5; Platelets 167; Potassium 3.9; Sodium 136   Lipid Panel    Component Value Date/Time   CHOL 168 08/17/2019 0402   TRIG 60 08/17/2019 0402   HDL 85 08/17/2019 0402   CHOLHDL 2.0 08/17/2019 0402   VLDL 12 08/17/2019 0402   LDLCALC 71 08/17/2019 0402    Additional studies/ records that were reviewed today include:   Echo 04/07/2019 1. The left ventricle has normal systolic function, with an ejection fraction of 55-60%. The cavity size was normal. Indeterminate diastolic filling due to E-A fusion.  2. LV regional wall motion abnormalities could not be completely assessed due to suboptimal acoustic windows. Grossly normal wall motion.  3. The inferior vena cava was dilated in size with <50% respiratory variability.   Cath 08/22/2019  Prox LAD lesion is 95% stenosed.  A drug-eluting stent was successfully placed using a STENT SYNERGY DES 3.5X20.  Post intervention, there is a 0% residual stenosis.  Ost 1st Diag to 1st Diag lesion is 40% stenosed.  Dist LAD lesion is 10% stenosed.  Non-stenotic 2nd Mrg lesion was previously treated.  3rd Mrg lesion is 30% stenosed.  Mid Cx lesion is 50% stenosed.  RPDA lesion is 30% stenosed.  Prox RCA to Mid RCA lesion is 45% stenosed.  Mid RCA lesion is 25% stenosed.  LV end diastolic pressure is normal.  Hemodynamic findings consistent with mild pulmonary hypertension.   1. Single vessel obstructive CAD with restenosis in the proximal LAD stent.  2. Successful PCI of the proximal LAD with DES x 1 3. Mild pulmonary HTN 4. Normal LV filling pressures 5. Procedure with 50 cc of contrast used  Plan: DAPT indefinitely. Anticipate DC home in am.    ASSESSMENT:    1. Coronary artery disease involving native coronary artery of native heart without angina pectoris   2. Essential hypertension    3. Hyperlipidemia LDL goal <70   4. CKD (chronic kidney disease), stage IV ([place])   5. Controlled type 2 diabetes mellitus without complication, with long-term current use of insulin (HCC)      PLAN:  In order of problems listed above:  1. CAD: Recently underwent DES to proximal LAD.  Continue on aspirin and Brilinta.  He denies any further chest discomfort  2. Hypertension: Blood pressure stable  3. Hyperlipidemia: Continue amlodipine, hydralazine, Imdur and Toprol-XL.  4. DM2: On insulin, managed by primary care provider  5. CKD stage IV: Seen by [name] at nephrology office.  Note, he has resumed on the 80 mg Lasix that he was placed on by nephrology service prior to the recent hospitalization.  I asked him to follow-up closely with his nephrologist to monitor renal function.    Medication Adjustments/Labs and Tests Ordered: Current medicines are reviewed at length with the patient today.  Concerns regarding medicines are outlined above.  Medication changes, Labs and Tests ordered today are listed in the Patient Instructions below. Patient Instructions  Medication Instructions:   Continue to take Lasix 80 mg daily  If you need a  refill on your cardiac medications before your next appointment, please call your pharmacy.   Lab work: NONE ordered at this time of appointment. Labs will be deferred to your Nephrologist   If you have labs (blood work) drawn today and your tests are completely normal, you will receive your results only by: [name] MyChart Message (if you have MyChart) OR . A paper copy in the mail If you have any lab test that is abnormal or we need to change your treatment, we will call you to review the results.  Testing/Procedures: NONE ordered at this time of appointment   Follow-Up: At [facility], you and your health needs are our priority.  As part of our continuing mission to provide you with exceptional heart care, we have created designated Provider  Care Teams.  These Care Teams include your primary Cardiologist (physician) and Advanced Practice Providers (APPs -  Physician Assistants and Nurse Practitioners) who all work together to provide you with the care you need, when you need it. You will need a follow up appointment in 3-4 months with [name], MD.  Any Other Special Instructions Will Be Listed Below (If Applicable).       Hilbert Corrigan, [place]  09/13/2019 9:51 AM    [address], [place], [place]  [address] Phone: [phone]; Fax: [phone]

## 2019-09-13 ENCOUNTER — Encounter: Payer: Self-pay | Admitting: Physician Assistant

## 2019-09-19 ENCOUNTER — Telehealth (HOSPITAL_COMMUNITY): Payer: Self-pay

## 2019-09-19 ENCOUNTER — Other Ambulatory Visit: Payer: Self-pay | Admitting: Cardiovascular Disease

## 2019-09-22 NOTE — Telephone Encounter (Signed)
Cardiac Rehab Medication Review by a Pharmacist  Does the patient  feel that his/her medications are working for him/her?  yes  Has the patient been experiencing any side effects to the medications prescribed?  no  Does the patient measure his/her own blood pressure or blood glucose at home?  Not his blood pressure. Yes, blood glucose. It's all over the place. Lowest 60, highest 300.   Does the patient have any problems obtaining medications due to transportation or finances?   no  Understanding of regimen: good Understanding of indications: good Potential of compliance: good    Pharmacist comments: The patient complains his blood glucose can be "all over the place" sometimes. He has no complaints or concerns about his medications at this time.     Agnes Lawrence, PharmD PGY1 Pharmacy Resident

## 2019-09-25 ENCOUNTER — Telehealth (HOSPITAL_COMMUNITY): Payer: Self-pay | Admitting: *Deleted

## 2019-09-25 ENCOUNTER — Ambulatory Visit (HOSPITAL_COMMUNITY): Payer: Medicaid Other

## 2019-09-25 NOTE — Telephone Encounter (Signed)
-----   Message from Suzan Slick, NP sent at 09/25/2019  9:24 AM EDT ----- Regarding: RE: cardiac rehab Would be best to wait, but the nustep would be ok  ----- Message ----- From: Magda Kiel, RN Sent: 09/21/2019   2:55 PM EDT To: Suzan Slick, NP Subject: cardiac rehab                                  Good afternoon, Mr Daisey is tentatively scheduled for orientation for cardiac rehab. He saw you in the office on 08/28/19 and was fitted for a prosthesis for his  right foot.  I talked to Mr Tadros would it be better if he waited to participate in cardiac rehab after his prosthesis arrives?  Mr Pitta says that he is unsteady on his feet and cant walk for long periods of time.  Thank you for your input!  Sincerely,  Barnet Pall RN Cardiac rehab  We usually have our patients complete a 6 minute walk test on orientation day. We can do a nustep test.  [phone]

## 2019-09-25 NOTE — Telephone Encounter (Signed)
Spoke with patient will hold off on orientation until patient obtains his prosthesis. States understanding. Will cancel orientation for today.Barnet Pall, RN,BSN 09/25/2019 9:52 AM

## 2019-10-01 ENCOUNTER — Ambulatory Visit (HOSPITAL_COMMUNITY): Payer: Medicaid Other

## 2019-10-03 ENCOUNTER — Ambulatory Visit (HOSPITAL_COMMUNITY): Payer: Medicaid Other

## 2019-10-05 ENCOUNTER — Ambulatory Visit (HOSPITAL_COMMUNITY): Payer: Medicaid Other

## 2019-10-08 ENCOUNTER — Ambulatory Visit (HOSPITAL_COMMUNITY): Payer: Medicaid Other

## 2019-10-10 ENCOUNTER — Ambulatory Visit (HOSPITAL_COMMUNITY): Payer: Medicaid Other

## 2019-10-12 ENCOUNTER — Ambulatory Visit (HOSPITAL_COMMUNITY): Payer: Medicaid Other

## 2019-10-15 ENCOUNTER — Ambulatory Visit (HOSPITAL_COMMUNITY): Payer: Medicaid Other

## 2019-10-17 ENCOUNTER — Other Ambulatory Visit: Payer: Self-pay | Admitting: Physician Assistant

## 2019-10-17 ENCOUNTER — Ambulatory Visit (HOSPITAL_COMMUNITY): Payer: Medicaid Other

## 2019-10-19 ENCOUNTER — Ambulatory Visit (INDEPENDENT_AMBULATORY_CARE_PROVIDER_SITE_OTHER): Payer: Medicaid Other

## 2019-10-19 ENCOUNTER — Ambulatory Visit (HOSPITAL_COMMUNITY): Payer: Medicaid Other

## 2019-10-19 ENCOUNTER — Ambulatory Visit (INDEPENDENT_AMBULATORY_CARE_PROVIDER_SITE_OTHER): Payer: Medicaid Other | Admitting: Orthopaedic Surgery

## 2019-10-19 ENCOUNTER — Ambulatory Visit: Payer: Self-pay

## 2019-10-19 ENCOUNTER — Encounter: Payer: Self-pay | Admitting: Orthopaedic Surgery

## 2019-10-19 ENCOUNTER — Other Ambulatory Visit: Payer: Self-pay

## 2019-10-19 DIAGNOSIS — M25511 Pain in right shoulder: Secondary | ICD-10-CM

## 2019-10-19 DIAGNOSIS — G8929 Other chronic pain: Secondary | ICD-10-CM | POA: Diagnosis not present

## 2019-10-19 MED ORDER — TRAMADOL HCL 50 MG PO TABS: 50.0000 mg | ORAL_TABLET | Freq: Four times a day (QID) | ORAL | 0 refills | Status: DC | PRN

## 2019-10-19 NOTE — Progress Notes (Signed)
Subjective: Patient is here for ultrasound-guided intra-articular right glenohumeral injection.  Has GH DJD.  Objective:  Pain and decreased ROM with overhead reach.  Procedure: Ultrasound-guided right glenohumeral injection: After sterile prep with Betadine, injected 8 cc 1% lidocaine without epinephrine and 40 mg methylprednisolone using a 22-gauge spinal needle, passing the needle through approach into the glenohumeral joint.  Injectate seen filling joint capsule.  Good immediate relief.

## 2019-10-19 NOTE — Progress Notes (Signed)
Office Visit Note   Patient: Joshua [name]           Date of Birth: 1956-05-30           MRN: [id number] Visit Date: 10/19/2019              Requested by: [name], MD [place],  [place] [address] PCP: [name], MD   Assessment & Plan: Visit Diagnoses:  1. Chronic right shoulder pain     Plan: Impression is end-stage of joint disease right shoulder.  Will refer the patient to Dr. Junius Roads for an ultrasound-guided glenohumeral cortisone injection.  He will follow up with [place] as needed.  Call with concerns or questions.  Follow-Up Instructions: Return if symptoms worsen or fail to improve.   Orders:  Orders Placed This Encounter  Procedures  . XR Shoulder Right   No orders of the defined types were placed in this encounter.     Procedures: No procedures performed   Clinical Data: No additional findings.   Subjective: Chief Complaint  Patient presents with  . Right Shoulder - Pain    HPI patient is a pleasant 63 year old gentleman who presents to our clinic today with chronic right shoulder pain.  This initially began back in high school when he was injured while playing football.  He is unsure what type of injury he had but notes that he never underwent surgical intervention.  He had continued pain for years which recently worsened over the past 6 to 8 months.  No new injury or change in activity.  The pain he has is to the entire shoulder.  Worse with forward flexion, abduction and internal rotation.  He denies any weakness.  He has been taking Tylenol as well as Salonpas and Flexeril without significant relief of symptoms.  No numbness, tingling or burning.  No previous cortisone injection.  Review of Systems as detailed in HPI.  All others reviewed and are negative.   Objective: Vital Signs: There were no vitals taken for this visit.  Physical Exam well-developed and well-nourished gentleman in no acute distress.  Alert oriented  x3.  Ortho Exam right shoulder exam shows full active range of motion.  He does have significant pain past about 160 degrees of forward flexion.  He can internally rotate to his back pocket.  Positive empty can and cross body adduction.  Negative drop arm.  He has full strength throughout.  He is neurovascular intact distally.  Specialty Comments:  No specialty comments available.  Imaging: Xr Shoulder Right  Result Date: 10/19/2019 X-rays demonstrate significant glenohumeral degenerative changes    PMFS History: Patient Active Problem List   Diagnosis Date Noted  . Unstable angina ([place]) 08/17/2019  . Chest pain 08/16/2019  . NSTEMI (non-ST elevated myocardial infarction) ([place]) 04/06/2019  . ACS (acute coronary syndrome) ([place]) 04/06/2019  . Sepsis ([place]) 03/15/2018  . DKA (diabetic ketoacidoses) ([place]) 03/15/2018  . Special screening for malignant neoplasms, colon 08/22/2017  . Acute renal failure superimposed on stage 3 chronic kidney disease ([place]) 05/19/2017  . Essential hypertension   . Status post transmetatarsal amputation of foot, right ([place]) 11/08/2016  . Diabetic polyneuropathy associated with type 2 diabetes mellitus ([place]) 11/08/2016  . Pure hypercholesterolemia   . AKI (acute kidney injury) ([place])   . Acute blood loss anemia   . DM type 2 with diabetic peripheral neuropathy ([place])   . Physical debility 08/03/2016  . Chronic osteomyelitis of right foot ([place])   .  Cellulitis and abscess of leg, except foot   . Penetrating wound of right foot   . CKD (chronic kidney disease) stage 3, GFR 30-59 ml/min (HCC)   . Unstable angina pectoris ([place]) 07/18/2016  . Chronic combined systolic and diastolic congestive heart failure ([place])   . Chest pain with moderate risk for cardiac etiology 06/24/2014  . CAD S/P multiple PCIs 06/24/2014  . Sleep apnea- on C-pap 06/24/2014  . Morbid obesity due to excess calories ([place]) 11/10/2013  . Hyperglycemia 10/29/2013  . Hyperlipidemia  01/12/2011  . Benign essential HTN 01/12/2011  . GERD 01/12/2011   Past Medical History:  Diagnosis Date  . Anemia   . Chronic combined systolic and diastolic CHF (congestive heart failure) ([place])    a. 07/2015 Echo: EF 45-50%, mod LVH, mid apical/antsept AK, Gr 1 DD.  [name] CKD (chronic kidney disease), stage III   . Coronary artery disease    a. 2012 s/p MI/PCI: s/p DES to mid/dist RCA and overlapping DES to LAD;  b. 07/2015 Cath: LAD 10% ISR, D1 40(jailed), LCX 35m, OM2 30, OM3 30, RCA 24m ISR, 10d ISR; c. 07/2016 NSTEMI/PCI: LAD 85ost/p/m (3.5x20 Synergy DES overlapping prior stent), D1 40, LCX 56m, OM2 95 (2.75x16 Synergy DES), OM3 30, RCA 45m, 10d.  . Depression   . Diabetic gastroparesis ([place])   . DKA (diabetic ketoacidoses) ([place]) 03/14/2018  . GERD (gastroesophageal reflux disease)   . Gout   . Heart murmur   . Hyperlipemia   . Hypertensive heart disease   . Ischemic cardiomyopathy    a. 07/2015 Echo: EF 45-50%, mod LVH, mid-apicalanteroseptal AK, Gr1 DD.  . Morbid obesity ([place])   . OSA on CPAP   . Osteomyelitis ([place])    a. 07/2016 MTP joint of R great toe s/p transmetatarsal amputation.  . Polyneuropathy in diabetes(357.2)   . Pulmonary sarcoidosis ([place])    "problems w/it years ago; no problems anymore" (07/03/2014)  . Rectal bleeding   . Sleep apnea   . Type II diabetes mellitus (HCC)     Family History  Problem Relation Age of Onset  . Heart attack Maternal Aunt   . Diabetes Maternal Grandmother   . Hypertension Maternal Grandmother     Past Surgical History:  Procedure Laterality Date  . AMPUTATION Right 07/24/2016   Procedure: TRANSMETATARSAL FOOT AMPUTATION;  Surgeon: [name], MD;  Location: [place];  Service: Orthopedics;  Laterality: Right;  . BALLOON DILATION N/A 03/21/2018   Procedure: BALLOON DILATION;  Surgeon: [name], MD;  Location: [facility] ENDOSCOPY;  Service: Endoscopy;  Laterality: N/A;  . BREAST LUMPECTOMY Right ~ 2000   "benign"  . CARDIAC  CATHETERIZATION N/A 07/30/2015   Procedure: Left Heart Cath and Coronary Angiography;  Surgeon: Burnell Blanks, MD;  Location: [place] CV LAB;  Service: Cardiovascular;  Laterality: N/A;  . CARDIAC CATHETERIZATION N/A 07/19/2016   Procedure: Left Heart Cath and Coronary Angiography;  Surgeon: Belva Crome, MD;  Location: [place] CV LAB;  Service: Cardiovascular;  Laterality: N/A;  . CARDIAC CATHETERIZATION N/A 07/29/2016   Procedure: Coronary Stent Intervention;  Surgeon: Belva Crome, MD;  Location: [place] CV LAB;  Service: Cardiovascular;  Laterality: N/A;  . CARDIAC CATHETERIZATION N/A 07/29/2016   Procedure: Intravascular Ultrasound/IVUS;  Surgeon: Belva Crome, MD;  Location: [place] CV LAB;  Service: Cardiovascular;  Laterality: N/A;  . CATARACT EXTRACTION W/ INTRAOCULAR LENS  IMPLANT, BILATERAL Bilateral 2014-2015  . COLONOSCOPY WITH PROPOFOL N/A 08/22/2017   Procedure:  COLONOSCOPY WITH PROPOFOL;  Surgeon: [name], MD;  Location: [place];  Service: Endoscopy;  Laterality: N/A;  . CORONARY ANGIOPLASTY WITH STENT PLACEMENT  10/31/2011   30 % MID ATRIOVENTRICULAR GROOVE CX STENOSIS. 90 % MID RCA.PTA TO LAD/STENTING OF TANDEM 60-70%. LAD STENOSIS 99% REDUCED TO 0%.3.0 X 32MM PROMUS DES POSTDILATED TO 3.25MM.  [name] CORONARY ANGIOPLASTY WITH STENT PLACEMENT  07/03/2014   "2"  . CORONARY STENT INTERVENTION N/A 04/09/2019   Procedure: CORONARY STENT INTERVENTION;  Surgeon: Jettie Booze, MD;  Location: [place] CV LAB;  Service: Cardiovascular;  Laterality: N/A;  . CORONARY STENT INTERVENTION N/A 08/22/2019   Procedure: CORONARY STENT INTERVENTION;  Surgeon: Martinique, Peter M, MD;  Location: [place] CV LAB;  Service: Cardiovascular;  Laterality: N/A;  . ESOPHAGOGASTRODUODENOSCOPY (EGD) WITH PROPOFOL N/A 03/21/2018   Procedure: ESOPHAGOGASTRODUODENOSCOPY (EGD) WITH PROPOFOL;  Surgeon: [name], MD;  Location: [place];  Service: Endoscopy;   Laterality: N/A;  . I&D EXTREMITY Right 10/30/2013   Procedure: IRRIGATION AND DEBRIDEMENT RIGHT SMALL FINGER POSSIBLE SECONDARY WOUND CLOSURE;  Surgeon: [name], MD;  Location: [facility];  Service: Orthopedics;  Laterality: Right;  [name] is available after 4pm  . I&D EXTREMITY Right 11/01/2013   Procedure:  IRRIGATION AND DEBRIDEMENT RIGHT  PROXIMAL PHALANGEAL LEVEL AMPUTATION;  Surgeon: [name], MD;  Location: [facility];  Service: Orthopedics;  Laterality: Right;  . INCISION AND DRAINAGE Right 10/28/2013   Procedure: INCISION AND DRAINAGE Right small finger;  Surgeon: [name], MD;  [facility];  Service: Orthopedics;  Laterality: Right;  . LEFT HEART CATH Left 11/03/2011   Procedure: LEFT HEART CATH;  Surgeon: [name], MD;  Location: [facility] CATH LAB;  Service: Cardiovascular;  Laterality: Left;  . LEFT HEART CATH AND CORONARY ANGIOGRAPHY N/A 04/09/2019   Procedure: LEFT HEART CATH AND CORONARY ANGIOGRAPHY;  Surgeon: Jettie Booze, MD;  Location: [place] CV LAB;  Service: Cardiovascular;  Laterality: N/A;  . LEFT HEART CATHETERIZATION WITH CORONARY ANGIOGRAM N/A 07/03/2014   Procedure: LEFT HEART CATHETERIZATION WITH CORONARY ANGIOGRAM;  Surgeon: [name], MD;  Location: [facility] CATH LAB;  Service: Cardiovascular;  Laterality: N/A;  . PERCUTANEOUS CORONARY STENT INTERVENTION (PCI-S) Left 11/03/2011   Procedure: PERCUTANEOUS CORONARY STENT INTERVENTION (PCI-S);  Surgeon: [name], MD;  Location: [facility] CATH LAB;  Service: Cardiovascular;  Laterality: Left;  . RIGHT/LEFT HEART CATH AND CORONARY ANGIOGRAPHY N/A 08/22/2019   Procedure: RIGHT/LEFT HEART CATH AND CORONARY ANGIOGRAPHY;  Surgeon: Martinique, Peter M, MD;  Location: [place] CV LAB;  Service: Cardiovascular;  Laterality: N/A;  . TOE AMPUTATION Right ~ 2010   "2nd toe"  . TOE AMPUTATION Right 2012   "3rd toe"   Social History   Occupational History  . Not on file  Tobacco Use  .  Smoking status: Never Smoker  . Smokeless tobacco: Never Used  Substance and Sexual Activity  . Alcohol use: Yes    Comment: social   . Drug use: No  . Sexual activity: Yes

## 2019-10-21 ENCOUNTER — Other Ambulatory Visit: Payer: Self-pay | Admitting: Physician Assistant

## 2019-10-22 ENCOUNTER — Ambulatory Visit (HOSPITAL_COMMUNITY): Payer: Medicaid Other

## 2019-10-24 ENCOUNTER — Ambulatory Visit (HOSPITAL_COMMUNITY): Payer: Medicaid Other

## 2019-10-26 ENCOUNTER — Ambulatory Visit (HOSPITAL_COMMUNITY): Payer: Medicaid Other

## 2019-10-29 ENCOUNTER — Ambulatory Visit (HOSPITAL_COMMUNITY): Payer: Medicaid Other

## 2019-10-31 ENCOUNTER — Ambulatory Visit (HOSPITAL_COMMUNITY): Payer: Medicaid Other

## 2019-11-02 ENCOUNTER — Ambulatory Visit (HOSPITAL_COMMUNITY): Payer: Medicaid Other

## 2019-11-05 ENCOUNTER — Ambulatory Visit (HOSPITAL_COMMUNITY): Payer: Medicaid Other

## 2019-11-07 ENCOUNTER — Ambulatory Visit (HOSPITAL_COMMUNITY): Payer: Medicaid Other

## 2019-11-09 ENCOUNTER — Ambulatory Visit (HOSPITAL_COMMUNITY): Payer: Medicaid Other

## 2019-11-12 ENCOUNTER — Ambulatory Visit (HOSPITAL_COMMUNITY): Payer: Medicaid Other

## 2019-11-14 ENCOUNTER — Ambulatory Visit (HOSPITAL_COMMUNITY): Payer: Medicaid Other

## 2019-11-15 ENCOUNTER — Encounter: Payer: Self-pay | Admitting: Orthopaedic Surgery

## 2019-11-15 ENCOUNTER — Ambulatory Visit (INDEPENDENT_AMBULATORY_CARE_PROVIDER_SITE_OTHER): Payer: Medicaid Other | Admitting: Orthopaedic Surgery

## 2019-11-15 ENCOUNTER — Other Ambulatory Visit: Payer: Self-pay

## 2019-11-15 DIAGNOSIS — S90822A Blister (nonthermal), left foot, initial encounter: Secondary | ICD-10-CM | POA: Diagnosis not present

## 2019-11-15 NOTE — Progress Notes (Signed)
Office Visit Note   Patient: Joshua [name]           Date of Birth: 14-Feb-1956           MRN: [id number] Visit Date: 11/15/2019              Requested by: [name], MD [place],  [place] [address] PCP: [name], MD   Assessment & Plan: Visit Diagnoses:  1. Blister of left foot, initial encounter     Plan: Impression is right shoulder glenohumeral arthritis.  #2 left foot blood blister fifth metatarsal head.  In regards to the right shoulder, I have told him it is too early to reinject this with cortisone.  He will take over-the-counter pain medications as needed.  In regards to the left foot blister, we provided him with a prescription for Darco shoe from [employer].  He will keep this well-padded.  He may discontinue the darco shoe once the blister has healed.  He will follow-up with [place] as needed.  Call with any concerns or questions in the meantime.  Follow-Up Instructions: Return if symptoms worsen or fail to improve.   Orders:  No orders of the defined types were placed in this encounter.  No orders of the defined types were placed in this encounter.     Procedures: No procedures performed   Clinical Data: No additional findings.   Subjective: Chief Complaint  Patient presents with  . Left Foot - Wound Check  . Right Shoulder - Follow-up    HPI patient is a pleasant 63 year old gentleman who presents to our clinic today for follow-up of his right shoulder.  History of glenohumeral osteoarthritis.  He was seen by Dr. Junius Roads on 10/19/2019 where this was injected with cortisone under ultrasound.  He had significant relief for about 2 weeks.  His pain is started to return.  Other issue he brings up today is a blister to his left foot.  He noticed that 2 months ago he bumped his foot on something which caused a blister.  He did okay until about 2 to 3 weeks ago when he noticed increased pain.  His pain improved until a few days ago when it  returned.  No fevers or chills.  All of his pain is to the fifth metatarsal head.  Worse with weightbearing.  He does note history of diabetes with neuropathy to both feet.  He is also status post right foot transmetatarsal amputation by Dr. Sharol Given in 2017.  He has not been wearing a prosthesis until he was fitted with one last week.  He thinks favoring the left foot may have contributed to his issue.  Review of Systems as detailed in HPI.  All others reviewed and are negative.   Objective: Vital Signs: There were no vitals taken for this visit.  Physical Exam well-developed well-nourished gentleman in no acute distress.  Alert and oriented x3.  Ortho Exam stable exam of the right shoulder.  Examination of the left foot reveals a 5 x 5 mm blood blister to the fifth metatarsal head.  This is moderately tender.  No signs of infection or drainage.  Decreased sensation throughout.  Specialty Comments:  No specialty comments available.  Imaging: No new imaging   PMFS History: Patient Active Problem List   Diagnosis Date Noted  . Unstable angina ([place]) 08/17/2019  . Chest pain 08/16/2019  . NSTEMI (non-ST elevated myocardial infarction) ([place]) 04/06/2019  . ACS (acute coronary syndrome) (  [place]) 04/06/2019  . Sepsis ([place]) 03/15/2018  . DKA (diabetic ketoacidoses) ([place]) 03/15/2018  . Special screening for malignant neoplasms, colon 08/22/2017  . Acute renal failure superimposed on stage 3 chronic kidney disease ([place]) 05/19/2017  . Essential hypertension   . Status post transmetatarsal amputation of foot, right ([place]) 11/08/2016  . Diabetic polyneuropathy associated with type 2 diabetes mellitus ([place]) 11/08/2016  . Pure hypercholesterolemia   . AKI (acute kidney injury) ([place])   . Acute blood loss anemia   . DM type 2 with diabetic peripheral neuropathy ([place])   . Physical debility 08/03/2016  . Chronic osteomyelitis of right foot ([place])   . Cellulitis and abscess of leg, except foot   .  Penetrating wound of right foot   . CKD (chronic kidney disease) stage 3, GFR 30-59 ml/min (HCC)   . Unstable angina pectoris ([place]) 07/18/2016  . Chronic combined systolic and diastolic congestive heart failure ([place])   . Chest pain with moderate risk for cardiac etiology 06/24/2014  . CAD S/P multiple PCIs 06/24/2014  . Sleep apnea- on C-pap 06/24/2014  . Morbid obesity due to excess calories ([place]) 11/10/2013  . Hyperglycemia 10/29/2013  . Hyperlipidemia 01/12/2011  . Benign essential HTN 01/12/2011  . GERD 01/12/2011   Past Medical History:  Diagnosis Date  . Anemia   . Chronic combined systolic and diastolic CHF (congestive heart failure) ([place])    a. 07/2015 Echo: EF 45-50%, mod LVH, mid apical/antsept AK, Gr 1 DD.  [name] CKD (chronic kidney disease), stage III   . Coronary artery disease    a. 2012 s/p MI/PCI: s/p DES to mid/dist RCA and overlapping DES to LAD;  b. 07/2015 Cath: LAD 10% ISR, D1 40(jailed), LCX 55m, OM2 30, OM3 30, RCA 35m ISR, 10d ISR; c. 07/2016 NSTEMI/PCI: LAD 85ost/p/m (3.5x20 Synergy DES overlapping prior stent), D1 40, LCX 58m, OM2 95 (2.75x16 Synergy DES), OM3 30, RCA 21m, 10d.  . Depression   . Diabetic gastroparesis ([place])   . DKA (diabetic ketoacidoses) ([place]) 03/14/2018  . GERD (gastroesophageal reflux disease)   . Gout   . Heart murmur   . Hyperlipemia   . Hypertensive heart disease   . Ischemic cardiomyopathy    a. 07/2015 Echo: EF 45-50%, mod LVH, mid-apicalanteroseptal AK, Gr1 DD.  . Morbid obesity ([place])   . OSA on CPAP   . Osteomyelitis ([place])    a. 07/2016 MTP joint of R great toe s/p transmetatarsal amputation.  . Polyneuropathy in diabetes(357.2)   . Pulmonary sarcoidosis ([place])    "problems w/it years ago; no problems anymore" (07/03/2014)  . Rectal bleeding   . Sleep apnea   . Type II diabetes mellitus (HCC)     Family History  Problem Relation Age of Onset  . Heart attack Maternal Aunt   . Diabetes Maternal Grandmother   . Hypertension Maternal  Grandmother     Past Surgical History:  Procedure Laterality Date  . AMPUTATION Right 07/24/2016   Procedure: TRANSMETATARSAL FOOT AMPUTATION;  Surgeon: [name], MD;  Location: [place];  Service: Orthopedics;  Laterality: Right;  . BALLOON DILATION N/A 03/21/2018   Procedure: BALLOON DILATION;  Surgeon: [name], MD;  Location: [facility] ENDOSCOPY;  Service: Endoscopy;  Laterality: N/A;  . BREAST LUMPECTOMY Right ~ 2000   "benign"  . CARDIAC CATHETERIZATION N/A 07/30/2015   Procedure: Left Heart Cath and Coronary Angiography;  Surgeon: Burnell Blanks, MD;  Location: [place] CV LAB;  Service: Cardiovascular;  Laterality: N/A;  .  CARDIAC CATHETERIZATION N/A 07/19/2016   Procedure: Left Heart Cath and Coronary Angiography;  Surgeon: Belva Crome, MD;  Location: [place] CV LAB;  Service: Cardiovascular;  Laterality: N/A;  . CARDIAC CATHETERIZATION N/A 07/29/2016   Procedure: Coronary Stent Intervention;  Surgeon: Belva Crome, MD;  Location: [place] CV LAB;  Service: Cardiovascular;  Laterality: N/A;  . CARDIAC CATHETERIZATION N/A 07/29/2016   Procedure: Intravascular Ultrasound/IVUS;  Surgeon: Belva Crome, MD;  Location: [place] CV LAB;  Service: Cardiovascular;  Laterality: N/A;  . CATARACT EXTRACTION W/ INTRAOCULAR LENS  IMPLANT, BILATERAL Bilateral 2014-2015  . COLONOSCOPY WITH PROPOFOL N/A 08/22/2017   Procedure: COLONOSCOPY WITH PROPOFOL;  Surgeon: [name], MD;  Location: [place];  Service: Endoscopy;  Laterality: N/A;  . CORONARY ANGIOPLASTY WITH STENT PLACEMENT  10/31/2011   30 % MID ATRIOVENTRICULAR GROOVE CX STENOSIS. 90 % MID RCA.PTA TO LAD/STENTING OF TANDEM 60-70%. LAD STENOSIS 99% REDUCED TO 0%.3.0 X 32MM PROMUS DES POSTDILATED TO 3.25MM.  [name] CORONARY ANGIOPLASTY WITH STENT PLACEMENT  07/03/2014   "2"  . CORONARY STENT INTERVENTION N/A 04/09/2019   Procedure: CORONARY STENT INTERVENTION;  Surgeon: Jettie Booze, MD;  Location: [place]  CV LAB;  Service: Cardiovascular;  Laterality: N/A;  . CORONARY STENT INTERVENTION N/A 08/22/2019   Procedure: CORONARY STENT INTERVENTION;  Surgeon: Martinique, Peter M, MD;  Location: [place] CV LAB;  Service: Cardiovascular;  Laterality: N/A;  . ESOPHAGOGASTRODUODENOSCOPY (EGD) WITH PROPOFOL N/A 03/21/2018   Procedure: ESOPHAGOGASTRODUODENOSCOPY (EGD) WITH PROPOFOL;  Surgeon: [name], MD;  Location: [place];  Service: Endoscopy;  Laterality: N/A;  . I&D EXTREMITY Right 10/30/2013   Procedure: IRRIGATION AND DEBRIDEMENT RIGHT SMALL FINGER POSSIBLE SECONDARY WOUND CLOSURE;  Surgeon: [name], MD;  [facility];  Service: Orthopedics;  Laterality: Right;  [name] is available after 4pm  . I&D EXTREMITY Right 11/01/2013   Procedure:  IRRIGATION AND DEBRIDEMENT RIGHT  PROXIMAL PHALANGEAL LEVEL AMPUTATION;  Surgeon: [name], MD;  [facility];  Service: Orthopedics;  Laterality: Right;  . INCISION AND DRAINAGE Right 10/28/2013   Procedure: INCISION AND DRAINAGE Right small finger;  Surgeon: [name], MD;  [facility];  Service: Orthopedics;  Laterality: Right;  . LEFT HEART CATH Left 11/03/2011   Procedure: LEFT HEART CATH;  Surgeon: [name], MD;  Location: [facility] CATH LAB;  Service: Cardiovascular;  Laterality: Left;  . LEFT HEART CATH AND CORONARY ANGIOGRAPHY N/A 04/09/2019   Procedure: LEFT HEART CATH AND CORONARY ANGIOGRAPHY;  Surgeon: Jettie Booze, MD;  Location: [place] CV LAB;  Service: Cardiovascular;  Laterality: N/A;  . LEFT HEART CATHETERIZATION WITH CORONARY ANGIOGRAM N/A 07/03/2014   Procedure: LEFT HEART CATHETERIZATION WITH CORONARY ANGIOGRAM;  Surgeon: [name], MD;  Location: [facility] CATH LAB;  Service: Cardiovascular;  Laterality: N/A;  . PERCUTANEOUS CORONARY STENT INTERVENTION (PCI-S) Left 11/03/2011   Procedure: PERCUTANEOUS CORONARY STENT INTERVENTION (PCI-S);  Surgeon: [name], MD;  Location: [facility] CATH  LAB;  Service: Cardiovascular;  Laterality: Left;  . RIGHT/LEFT HEART CATH AND CORONARY ANGIOGRAPHY N/A 08/22/2019   Procedure: RIGHT/LEFT HEART CATH AND CORONARY ANGIOGRAPHY;  Surgeon: Martinique, Peter M, MD;  Location: [place] CV LAB;  Service: Cardiovascular;  Laterality: N/A;  . TOE AMPUTATION Right ~ 2010   "2nd toe"  . TOE AMPUTATION Right 2012   "3rd toe"   Social History   Occupational History  . Not on file  Tobacco Use  . Smoking status:  Never Smoker  . Smokeless tobacco: Never Used  Substance and Sexual Activity  . Alcohol use: Yes    Comment: social   . Drug use: No  . Sexual activity: Yes

## 2019-11-16 ENCOUNTER — Ambulatory Visit (HOSPITAL_COMMUNITY): Payer: Medicaid Other

## 2019-11-19 ENCOUNTER — Ambulatory Visit (HOSPITAL_COMMUNITY): Payer: Medicaid Other

## 2019-11-21 ENCOUNTER — Ambulatory Visit: Payer: Medicaid Other | Admitting: Cardiovascular Disease

## 2019-11-21 ENCOUNTER — Encounter: Payer: Self-pay | Admitting: Cardiovascular Disease

## 2019-11-21 ENCOUNTER — Ambulatory Visit (HOSPITAL_COMMUNITY): Payer: Medicaid Other

## 2019-11-21 ENCOUNTER — Other Ambulatory Visit: Payer: Self-pay

## 2019-11-21 DIAGNOSIS — E785 Hyperlipidemia, unspecified: Secondary | ICD-10-CM | POA: Diagnosis not present

## 2019-11-21 DIAGNOSIS — G4733 Obstructive sleep apnea (adult) (pediatric): Secondary | ICD-10-CM | POA: Diagnosis not present

## 2019-11-21 DIAGNOSIS — N184 Chronic kidney disease, stage 4 (severe): Secondary | ICD-10-CM

## 2019-11-21 DIAGNOSIS — I2510 Atherosclerotic heart disease of native coronary artery without angina pectoris: Secondary | ICD-10-CM | POA: Diagnosis not present

## 2019-11-21 DIAGNOSIS — Z9989 Dependence on other enabling machines and devices: Secondary | ICD-10-CM

## 2019-11-21 DIAGNOSIS — E118 Type 2 diabetes mellitus with unspecified complications: Secondary | ICD-10-CM

## 2019-11-21 DIAGNOSIS — I10 Essential (primary) hypertension: Secondary | ICD-10-CM

## 2019-11-21 DIAGNOSIS — Z9861 Coronary angioplasty status: Secondary | ICD-10-CM

## 2019-11-21 MED ORDER — EZETIMIBE 10 MG PO TABS: 10.0000 mg | ORAL_TABLET | Freq: Every day | ORAL | 3 refills | Status: DC

## 2019-11-21 NOTE — Patient Instructions (Signed)
Medication Instructions:  Your physician has recommended you make the following change in your medication:   START EZETIMIBE (ZETIA) 10 MG BY MOUTH DAILY  *If you need a refill on your cardiac medications before your next appointment, please call your pharmacy*  Lab Work: Your physician recommends that you return for lab work in [place]    If you have labs (blood work) drawn today and your tests are completely normal, you will receive your results only by: [name] MyChart Message (if you have MyChart) OR . A paper copy in the mail If you have any lab test that is abnormal or we need to change your treatment, we will call you to review the results.  Testing/Procedures: NONE  Follow-Up: At [facility], you and your health needs are our priority.  As part of our continuing mission to provide you with exceptional heart care, we have created designated Provider Care Teams.  These Care Teams include your primary Cardiologist (physician) and Advanced Practice Providers (APPs -  Physician Assistants and Nurse Practitioners) who all work together to provide you with the care you need, when you need it.  Your next appointment:   3 month(s)  The format for your next appointment:   In Person  Provider:   [name], MD  Other Instructions YOUR CPAP [place]

## 2019-11-21 NOTE — Progress Notes (Signed)
Cardiology Office Note    Date:  11/21/2019   ID:  Joshua [name], DOB 1956-04-08, MRN [id number]  PCP:  [name], MD  Cardiologist:  [name], MD   No chief complaint on file.   History of Present Illness:  Joshua [name] is a 63 y.o. male who suffered an acute coronary syndrome on 10/31/2011 and presented with ECG findings of anterolateral ST segment elevation. Cardiac catheterization revealed a 99% mid LAD stenosis and proximal to this there was a 60-70% stenosis after the first diagonal vessel. He also had 90-95% mid RCA stenosis and 30% circumflex stenosis. He underwent initial acute intervention to his LAD and a 3.0x32 mm Promus DES stent was inserted. 3 days later he underwent successful staged PCI to his 95% RCA stenosis and a 3.0x18 mm Resolute DES stent was inserted and post dilated 3.5 mm.   Additional problems include at least a >30 year history of hypertension, a 25 year history of diabetes mellitus, obesity, as well as hyperlipidemia.  He was admitted to the hospital on 07/29/2015 with complaints of shortness of breath similar to his initial symptomatology. He had mild renal insufficiency, was hydrated and underwent cardiac catheterization by Dr. Julianne Handler in 07/30/2015. His LAD stents were patent. There was mild 40% first agonal stenosis, 60% circumflex stenosis, and his RCA stents had a 50% intimal narrowing in its mid stent without significant narrowing in the distal stent. An echo Doppler study on 07/31/2015 showed an EF of 45-50% with moderate left ventricular hypertrophy and grade 1 diastolic dysfunction. He was seen by Joshua [name] on 08/08/2015 for initial posthospital evaluation. Since that time, he has had some vague symptoms of GERD. He is not on any proton pump inhibitor. He gets shortness of breath with minimal activity including brushing his teeth and taking a shower. He has not been routinely exercising. He has obstructive sleep apnea and uses  CPAP therapy. He denies residual snoring. He works at the [employer] in the night shift. At times he feels that some of his food may get stuck when swallowing but this is not consistent.   When I saw him in September 2016, I started him on Protonix for significant GERD symptoms which has improved. We discussed weight loss and exercise. Laboratory showed worsening renal function with a creatinine of 1.84, significant glucose elevation at 669 with probable factitious hyponatremia at 129, due to high glucose levels. His hemoglobin A1c was 14.2. I referred him to Dr. Michiel Sites for endocrinologic evaluation. I also discontinued his spironolactone and added hydralazine 25 mg twice a day for more optimal blood pressure control in light of his renal insufficiency. We discussed the importance of weight loss.  When I saw him in the office in March 2017, he has been in a cast and boot in his right leg due to prior infection of his right toe. He did undergo hyperbaric chamber therapy. He tells me he was initially told of having borderline osteomyelitis. Four weeks ago his cultures were negative. He subsequently required right metatarsal amputation for osteomyelitis in August 2017. In August he was admitted to [facility] with unstable angina and ruled in for a non-STEM>Repeat catheterization showed new eccentric proximal LAD stenosis, not involving the previously placed overlapping mid LAD stents, eccentric 75% mid left circumflex stenosis between OM1 and OM 2, and progression of OM1 stenosis to 95%. He ultimately underwent stenting to his LAD and circumflex marginal vessel with Synergy stents. He was readmitted in October  2017 with recurrent chest pain which was felt to be atypical and musculoskeletal. A 2 day protocol nuclear stress test showed an EF of 39% without ischemia. He was seen in office follow-up by Almyra Deforest, [place] on 10/22/2016.   When I saw him in December 2017, he denied any  recurrent episodes of chest pain. His sleep was poor and he felt tired. Over 10 years ago he was evaluated for sleep apnea but had only used CPAP for 6 months. I referred him for a sleep study which was done on 01/07/2017. Revealed severe obstructive sleep apnea with an AHI of 41.6/h. He had severe oxygen desaturation to a nadir of 77%. He had increased periodic limb movements with an index of 25.15. On the diagnostic portion of the study there was absence of REM sleep. During the CPAP titration there was evidence for REM rebound, such that he slept 32.6% in REM sleep.   When I  saw him in 2018, he was on CPAP for 6 weeks and felt significantly improved. I obtained a download of his CPAP unit today from 03/07/2017 through 04/05/2017. He is 100% compliant with usage stays and usage greater than 4 hours and is averaging 8 hours and 4 minutes of sleep per night. He is set at a 17 cm pressure his AHI is excellent at 1.5. He does have a moderate leak. He uses a fullface mask. He denies any chest pain. He denies palpitations. He had noticed recently that his blood pressure is elevated.   When I saw him in June 2018, he was able to get a new job. 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 His work is from 7 PM until 7 AM. Typically he would go home and get to sleep between 8:39 AM. Depending upon if he had to pick his son up, he would either sleep until 5 PM or if he had to get his son up would have to wake up in the early afternoon. He denies exertionally precipitated chest pain but has noticed some sharp fleeting atypical sensations. There was confusion with his medications and he had run out of his metoprolol succinate which he was taking 150 mg daily and nitrates A new download was obtained in the office today from July 15 through 07/25/2017. He did have machine malfunction contribute to reduce compliance. However, at a 17 cm pressure. AHI was excellent at 0.4. An  Epworth Sleepiness Scale score was recalculated and this endorsed at 7.  Since September 2018 he was using CPAP more consistently and a download from September 25 through 10/05/2017 showed 83%. Usage stays. However, usage greater than 4 hours was only 67%. He was averaging 5 hours and 36 minutes. At 17 cm set pressure. AHI was excellent at 0.8. He did have a moderate mask leak. He had not use CPAP for the last 3 weeks due to significant sinus congestion. He works from 7 PM to 7 AM. He typically goes to bed at 8 AM until 4 PM. He has noticed mild leg swelling. He also has had issues with diabetes. He was seen in the emergency room on 11/21/2017 with right-sided flank pain. When he presented with GI symptoms and vomiting. He denies any anginal symptomatology. He has not been successful with weight loss.   Since the last office visit, patient was admitted with DKA and severe dehydration in the setting of uncontrolled type II DM in April 2019. On arrival his creatinine was 5.25, CBG of 1241, aniongap was  over 35. Hemoglobin A1c was greater than 15.5. On further discussion, he apparently lost his job and was not taking the insulin for more than 2-1/2 weeks.  He was seen by Almyra Deforest, PAC in December 2019, and was complaining of exertional chest discomfort for 2 months. Therefore we recommended outpatient cardiac cath however his renal function was down and creatinine came back 2.2. Cardiac catheterization was delayed. I stopped his diuretic at the repeated the lab work, creatinine still came back around 2.1. I discussed the case with Dr. Claiborne Billings and we eventually decided to use Lexiscan stress test to risk stratify. I also increase his Imdur to 120 mg twice daily for antianginal purposes. I increase his hydralazine to 3 times a day for better control. Myoview was obtained on 12/26/2018 which showed EF 33%, medium sized moderate intensity severe fixed septal and anteroseptal perfusion  defect which could represent scar or artifact, no significant reversible ischemia noted. Overall this was a high risk study due to decreased LVEF which has been reduced from the previous study in 2017.  Patient presents today for cardiology office visit with Isaac Laud and was have some chest tightness recently, especially with swallowing. He says he ran out of his pantoprazole that usually control as reflux. I am okay to give him 7-monthsupply of Protonix, however if he needs more he will need to discuss with his PCP. He also ran out of  Imdur 2 days ago as well, who refilled this medication. Otherwise, because he has some discomfort with swallowing, he has not been eating very well therefore has lost significant amount of weight. He also says he has been urinating more on the same dose of diuretic, I will defer the management of his renal function to his nephrologist. As far as his blood sugar, he says some days his blood sugar would be down to 70s, whereas other days, his blood sugar is quite high. Even on the previous lab work in December, his glucose ranges between 63-561. This is being managed by her primary care provider as well.   He had done relatively well until recentlybut apparently due to increasing recurrent episodes of nocturnal chest pain he was advised  to present to the hospital and was admitted on April 06, 2019.  He ruled in for non-STEMI. Creatinine was 2.01 on admission.  He underwent cardiac catheterization on April 09, 2019 and was found to have 90% in-stent restenosis of his proximal LAD stent.  His catheterization findings are detailed below in the CV studies section of this note.  He was discharged the following day and at discharge creatinine was 2.01, similar to admission.  I saw him for telemedicine evaluation on April 12, 2019 and since hospital discharge, he has felt well and denies any of his previous anginal symptomatology.  He denied PND orthopnea. He had not been  using his CPAP due to the sensation that he felt the pressures were blowing too high.  Remotely he was found to have severe sleep apnea which markedly normalized with his set 17 cm pressure.  However, over the last month a download reveals only 1 day of use for only 3.31 hours.  Since I last saw him, adjustments were made to lower his CPAP pressure and he therefore began using therapy.  He developed recurrent chest pain symptomatology in early September 2020 and repeat catheterization demonstrated 95% proximal LAD lesion treated with a 3.5 x 20 mm Synergy DES stent.  There was 50% mid left circumflex residual narrowing  and 40% ostial diagonal 1 stenosis.  He has continued to be followed by [name] of nephrology.  His diuretic was increased to Lasix 80 mg daily.  Presently, he has not had any recurrent anginal symptomatology since his September 2020 intervention.  He does have some difficulty walking since his right transmetatarsal amputation in 2017 and has a prosthesis in the foot making walking harder.  I obtained a download of his CPAP unit today which shows he is meeting compliance with usage at 90%.  However, usage is only 5 hours and 33 minutes per night.  At his reduced set pressure of only 9 cm, AHI is now increased at 15.1.  He presents for reevaluation.   Past Medical History:  Diagnosis Date   Anemia    Chronic combined systolic and diastolic CHF (congestive heart failure) ([place])    a. 07/2015 Echo: EF 45-50%, mod LVH, mid apical/antsept AK, Gr 1 DD.   CKD (chronic kidney disease), stage III    Coronary artery disease    a. 2012 s/p MI/PCI: s/p DES to mid/dist RCA and overlapping DES to LAD;  b. 07/2015 Cath: LAD 10% ISR, D1 40(jailed), LCX 77m OM2 30, OM3 30, RCA 538mSR, 10d ISR; c. 07/2016 NSTEMI/PCI: LAD 85ost/p/m (3.5x20 Synergy DES overlapping prior stent), D1 40, LCX 7557mM2 95 (2.75x16 Synergy DES), OM3 30, RCA 31m38md.   Depression    Diabetic gastroparesis (HCC)[place] DKA  (diabetic ketoacidoses) (HCC)[place]/01/2018   GERD (gastroesophageal reflux disease)    Gout    Heart murmur    Hyperlipemia    Hypertensive heart disease    Ischemic cardiomyopathy    a. 07/2015 Echo: EF 45-50%, mod LVH, mid-apicalanteroseptal AK, Gr1 DD.   Morbid obesity (HCC)[place] OSA on CPAP    Osteomyelitis (HCC)[place] a. 07/2016 MTP joint of R great toe s/p transmetatarsal amputation.   Polyneuropathy in diabetes(357.2)    Pulmonary sarcoidosis (HCC)[place] "problems w/it years ago; no problems anymore" (07/03/2014)   Rectal bleeding    Sleep apnea    Type II diabetes mellitus (HCC)[place]  Past Surgical History:  Procedure Laterality Date   AMPUTATION Right 07/24/2016   Procedure: TRANSMETATARSAL FOOT AMPUTATION;  Surgeon: MarcNewt Minion;  [facility];  Laterality: Right;   BALLOON DILATION N/A 03/21/2018   Procedure: BALLOON DILATION;  Surgeon: BuccRonald Lobo;  [facility];  Service: Endoscopy;  Laterality: N/A;   BREAST LUMPECTOMY Right ~ 2000   "benign"   CARDIAC CATHETERIZATION N/A 07/30/2015   Procedure: Left Heart Cath and Coronary Angiography;  Surgeon: ChriBurnell Blanks;  [facility];  Service: Cardiovascular;  Laterality: N/A;   CARDIAC CATHETERIZATION N/A 07/19/2016   Procedure: Left Heart Cath and Coronary Angiography;  Surgeon: HenrBelva Crome;  [facility];  Service: Cardiovascular;  Laterality: N/A;   CARDIAC CATHETERIZATION N/A 07/29/2016   Procedure: Coronary Stent Intervention;  Surgeon: HenrBelva Crome;  [facility];  Service: Cardiovascular;  Laterality: N/A;   CARDIAC CATHETERIZATION N/A 07/29/2016   Procedure: Intravascular Ultrasound/IVUS;  Surgeon: HenrBelva Crome;  [facility] [place];  Service: Cardiovascular;  Laterality: N/A;   CATARACT EXTRACTION W/ INTRAOCULAR LENS  IMPLANT, BILATERAL Bilateral 2014-2015   COLONOSCOPY WITH  PROPOFOL N/A 08/22/2017   Procedure: COLONOSCOPY WITH PROPOFOL;  Surgeon: SchoWilford Corner;  [facility];  Service: Endoscopy;  Laterality: N/A;   CORONARY ANGIOPLASTY WITH STENT PLACEMENT  10/31/2011   30 % MID ATRIOVENTRICULAR GROOVE CX STENOSIS. 90 % MID RCA.PTA TO LAD/STENTING OF TANDEM 60-70%. LAD STENOSIS 99% REDUCED TO 0%.3.0 X 32MM PROMUS DES POSTDILATED TO 3.25MM.   CORONARY ANGIOPLASTY WITH STENT PLACEMENT  07/03/2014   "2"   CORONARY STENT INTERVENTION N/A 04/09/2019   Procedure: CORONARY STENT INTERVENTION;  Surgeon: Jettie Booze, MD;  Location: [place] CV LAB;  Service: Cardiovascular;  Laterality: N/A;   CORONARY STENT INTERVENTION N/A 08/22/2019   Procedure: CORONARY STENT INTERVENTION;  Surgeon: Martinique, Peter M, MD;  Location: [place] CV LAB;  Service: Cardiovascular;  Laterality: N/A;   ESOPHAGOGASTRODUODENOSCOPY (EGD) WITH PROPOFOL N/A 03/21/2018   Procedure: ESOPHAGOGASTRODUODENOSCOPY (EGD) WITH PROPOFOL;  Surgeon: [name], MD;  Location: [place];  Service: Endoscopy;  Laterality: N/A;   I&D EXTREMITY Right 10/30/2013   Procedure: IRRIGATION AND DEBRIDEMENT RIGHT SMALL FINGER POSSIBLE SECONDARY WOUND CLOSURE;  Surgeon: [name], MD;  Location: [facility];  Service: Orthopedics;  Laterality: Right;  [name] is available after 4pm   I&D EXTREMITY Right 11/01/2013   Procedure:  IRRIGATION AND DEBRIDEMENT RIGHT  PROXIMAL PHALANGEAL LEVEL AMPUTATION;  Surgeon: [name], MD;  Location: [facility];  Service: Orthopedics;  Laterality: Right;   INCISION AND DRAINAGE Right 10/28/2013   Procedure: INCISION AND DRAINAGE Right small finger;  Surgeon: [name], MD;  Location: [facility];  Service: Orthopedics;  Laterality: Right;   LEFT HEART CATH Left 11/03/2011   Procedure: LEFT HEART CATH;  Surgeon: [name], MD;  Location: [facility] CATH LAB;  Service: Cardiovascular;  Laterality: Left;   LEFT HEART CATH AND CORONARY  ANGIOGRAPHY N/A 04/09/2019   Procedure: LEFT HEART CATH AND CORONARY ANGIOGRAPHY;  Surgeon: Jettie Booze, MD;  Location: [place] CV LAB;  Service: Cardiovascular;  Laterality: N/A;   LEFT HEART CATHETERIZATION WITH CORONARY ANGIOGRAM N/A 07/03/2014   Procedure: LEFT HEART CATHETERIZATION WITH CORONARY ANGIOGRAM;  Surgeon: [name], MD;  [facility];  Service: Cardiovascular;  Laterality: N/A;   PERCUTANEOUS CORONARY STENT INTERVENTION (PCI-S) Left 11/03/2011   Procedure: PERCUTANEOUS CORONARY STENT INTERVENTION (PCI-S);  Surgeon: [name], MD;  Location: [facility] CATH LAB;  Service: Cardiovascular;  Laterality: Left;   RIGHT/LEFT HEART CATH AND CORONARY ANGIOGRAPHY N/A 08/22/2019   Procedure: RIGHT/LEFT HEART CATH AND CORONARY ANGIOGRAPHY;  Surgeon: Martinique, Peter M, MD;  Location: [place] CV LAB;  Service: Cardiovascular;  Laterality: N/A;   TOE AMPUTATION Right ~ 2010   "2nd toe"   TOE AMPUTATION Right 2012   "3rd toe"    Current Medications: Outpatient Medications Prior to Visit  Medication Sig Dispense Refill   Accu-Chek FastClix Lancets MISC USE UP TO FOUR TIMES DAILY AS DIRECTED     ACCU-CHEK GUIDE test strip USE UP TO FOUR TIMES DAILY AS DIRECTED     acetaminophen (TYLENOL) 650 MG CR tablet Take 1,300 mg by mouth daily as needed for pain.     allopurinol (ZYLOPRIM) 300 MG tablet Take 300 mg by mouth daily.      amLODipine (NORVASC) 10 MG tablet Take 1 tablet (10 mg total) by mouth daily.     aspirin EC 81 MG tablet Take 81 mg by mouth daily.      blood glucose meter kit and supplies Dispense based on patient and insurance preference. Use up to four times daily as directed. (FOR ICD-10 E10.9, E11.9).  1 each 0   BRILINTA 90 MG TABS tablet Take 1 tablet by mouth twice daily 180 tablet 1   Camphor-Menthol-Methyl Sal (HM SALONPAS PAIN RELIEF EX) Apply 1 spray topically daily as needed (pain).     cyclobenzaprine (FLEXERIL) 10 MG tablet Take 10  mg by mouth as directed.     ferrous sulfate 324 MG TBEC Take 324 mg by mouth every morning.     furosemide (LASIX) 80 MG tablet Take 80 mg by mouth.     hydrALAZINE (APRESOLINE) 100 MG tablet Take 100 mg by mouth 3 (three) times daily.     insulin aspart (NOVOLOG) 100 UNIT/ML injection Inject 6 Units into the skin 3 (three) times daily with meals. (Patient taking differently: Inject 10-12 Units into the skin 2 (two) times daily. Sliding scale) 10 mL 11   insulin glargine (LANTUS) 100 UNIT/ML injection Inject 0.2 mLs (20 Units total) into the skin 2 (two) times daily. 10 mL 11   isosorbide mononitrate (IMDUR) 120 MG 24 hr tablet Take 1 tablet (120 mg total) by mouth 2 (two) times daily.     metoprolol succinate (TOPROL-XL) 50 MG 24 hr tablet Take 3 tablets (150 mg total) by mouth daily. Take with or immediately following a meal. 90 tablet 0   nitroGLYCERIN (NITROSTAT) 0.4 MG SL tablet Place 1 tablet (0.4 mg total) under the tongue every 5 (five) minutes as needed for chest pain. 25 tablet 1   pantoprazole (PROTONIX) 40 MG tablet TAKE 1 TABLET BY MOUTH TWICE DAILY BEFORE MEAL(S) 180 tablet 0   rosuvastatin (CRESTOR) 40 MG tablet Take 1 tablet by mouth once daily 30 tablet 0   Sulfamethoxazole-Trimethoprim (BACTRIM PO) Take by mouth.     traMADol (ULTRAM) 50 MG tablet Take 1-2 tablets (50-100 mg total) by mouth every 6 (six) hours as needed. 20 tablet 0   hydrALAZINE (APRESOLINE) 50 MG tablet Take 1 tablet (50 mg total) by mouth 3 (three) times daily. (Patient taking differently: Take 100 mg by mouth 3 (three) times daily. ) 270 tablet 3   No facility-administered medications prior to visit.      Allergies:   Chlorthalidone   Social History   Socioeconomic History   Marital status: Married    Spouse name: Not on file   Number of children: Not on file   Years of education: Not on file   Highest education level: Not on file  Occupational History   Not on file  Social Needs    Financial resource strain: Not on file   Food insecurity    Worry: Not on file    Inability: Not on file   Transportation needs    Medical: Not on file    Non-medical: Not on file  Tobacco Use   Smoking status: Never Smoker   Smokeless tobacco: Never Used  Substance and Sexual Activity   Alcohol use: Yes    Comment: social    Drug use: No   Sexual activity: Yes  Lifestyle   Physical activity    Days per week: Not on file    Minutes per session: Not on file   Stress: Not on file  Relationships   Social connections    Talks on phone: Not on file    Gets together: Not on file    Attends religious service: Not on file    Active member of club or organization: Not on file    Attends meetings of clubs or organizations: Not on file  Relationship status: Not on file  Other Topics Concern   Not on file  Social History Narrative   Not on file    Previously had worked at [employer]  Family History:  The patient's family history includes Diabetes in his maternal grandmother; Heart attack in his maternal aunt; Hypertension in his maternal grandmother.   ROS General: Negative; No fevers, chills, or night sweats;  HEENT: Negative; No changes in vision or hearing, sinus congestion, difficulty swallowing Pulmonary: Negative; No cough, wheezing, shortness of breath, hemoptysis Cardiovascular: Negative; No chest pain, presyncope, syncope, palpitations GI: Negative; No nausea, vomiting, diarrhea, or abdominal pain GU: Negative; No dysuria, hematuria, or difficulty voiding Musculoskeletal: Negative; no myalgias, joint pain, or weakness Hematologic/Oncology: Negative; no easy bruising, bleeding Endocrine: Negative; no heat/cold intolerance; no diabetes Neuro: Negative; no changes in balance, headaches Skin: Negative; No rashes or skin lesions Psychiatric: Negative; No behavioral problems, depression Sleep: Negative; No snoring, daytime sleepiness,  hypersomnolence, bruxism, restless legs, hypnogognic hallucinations, no cataplexy Other comprehensive 14 point system review is negative.   PHYSICAL EXAM:   VS:  BP (!) 158/82    Pulse 85    Ht _0  (1.753 m)    Wt (!) 317 lb 3.2 oz (143.9 kg)    BMI 46.84 kg/m     Repeat blood pressure by me was 132/78  Wt Readings from Last 3 Encounters:  11/21/19 (!) 317 lb 3.2 oz (143.9 kg)  09/11/19 (!) 310 lb 3.2 oz (140.7 kg)  08/23/19 (!) 314 lb 11.2 oz (142.7 kg)    General: Alert, oriented, no distress.  Skin: normal turgor, no rashes, warm and dry HEENT: Normocephalic, atraumatic. Pupils equal round and reactive to light; sclera anicteric; extraocular muscles intact;  Nose without nasal septal hypertrophy Mouth/Parynx benign; Mallinpatti scale 3/4 Neck: Thick neck; no JVD, no carotid bruits; normal carotid upstroke Lungs: clear to ausculatation and percussion; no wheezing or rales Chest wall: without tenderness to palpitation Heart: PMI not displaced, RRR, s1 s2 normal, 1/6 systolic murmur, no diastolic murmur, no rubs, gallops, thrills, or heaves Abdomen: Central adiposity;soft, nontender; no hepatosplenomehaly, BS+; abdominal aorta nontender and not dilated by palpation. Back: no CVA tenderness Pulses 2+ Musculoskeletal: full range of motion, normal strength, no joint deformities Extremities: no clubbing cyanosis or edema, Homan's sign negative  Neurologic: grossly nonfocal; Cranial nerves grossly wnl Psychologic: Normal mood and affect   Studies/Labs Reviewed:   EKG:  EKG is ordered today.  ECG (independently read by me): Normal sinus rhythm at 85 bpm.  Borderline first-degree AV block with  PR interval 202 ms.  Incomplete left bundle branch block.  No ectopy.  Recent Labs: BMP Latest Ref Rng & Units 08/23/2019 08/22/2019 08/22/2019  Glucose 70 - 99 mg/dL 207(H) - -  BUN 6 - 20 mg/dL 35(H) - -  Creatinine 0.61 - 1.24 mg/dL 2.78(H) - -  BUN/Creat Ratio 10 - 24 - - -  Sodium 135 -  145 mmol/L 136 143 143  Potassium 3.5 - 5.1 mmol/L 3.9 3.3(L) 3.3(L)  Chloride 98 - 111 mmol/L 106 - -  CO2 22 - 32 mmol/L 20(L) - -  Calcium 8.9 - 10.3 mg/dL 9.0 - -     Hepatic Function Latest Ref Rng & Units 08/17/2019 08/17/2019 04/06/2019  Total Protein 6.5 - 8.1 g/dL - 7.3 7.4  Albumin 3.5 - 5.0 g/dL - 3.3(L) 3.7  AST 15 - 41 U/L - 21 21  ALT 0 - 44 U/L 17 QUANTITY NOT SUFFICIENT,  UNABLE TO PERFORM TEST 16  Alk Phosphatase 38 - 126 U/L - 112 107  Total Bilirubin 0.3 - 1.2 mg/dL 0.5 QUANTITY NOT SUFFICIENT, UNABLE TO PERFORM TEST 0.5  Bilirubin, Direct 0.1 - 0.5 mg/dL - - -    CBC Latest Ref Rng & Units 08/23/2019 08/22/2019 08/22/2019  WBC 4.0 - 10.5 K/uL 7.2 - -  Hemoglobin 13.0 - 17.0 g/dL 12.5(L) 12.6(L) 12.6(L)  Hematocrit 39.0 - 52.0 % 39.2 37.0(L) 37.0(L)  Platelets 150 - 400 K/uL 167 - -   Lab Results  Component Value Date   MCV 86.3 08/23/2019   MCV 86.3 08/22/2019   MCV 86.0 08/21/2019   Lab Results  Component Value Date   TSH 1.675 08/16/2019   Lab Results  Component Value Date   HGBA1C 10.2 (H) 08/16/2019     BNP    Component Value Date/Time   BNP 81.1 08/16/2019 2133    ProBNP No results found for: PROBNP   Lipid Panel     Component Value Date/Time   CHOL 168 08/17/2019 0402   TRIG 60 08/17/2019 0402   HDL 85 08/17/2019 0402   CHOLHDL 2.0 08/17/2019 0402   VLDL 12 08/17/2019 0402   LDLCALC 71 08/17/2019 0402     RADIOLOGY: No results found.   Additional studies/ records that were reviewed today include:   CARDIAC CATH 04/09/2019  Previously placed Mid LAD drug eluting stent is widely patent.  Ost 1st Diag to 1st Diag lesion is 40% stenosed.  Dist LAD lesion is 10% stenosed.  Previously placed 2nd Mrg drug eluting stent is widely patent.  3rd Mrg lesion is 30% stenosed.  Mid Cx lesion is 50% stenosed.  RPDA lesion is 30% stenosed.  Prox RCA to Mid RCA lesion is 40% stenosed.  Mid RCA lesion is 25% stenosed.  LV end  diastolic pressure is normal.  There is no aortic valve stenosis.  Ost LAD to Prox LAD lesion is 90% stenosed. In stent restenosis.  Scoring balloon angioplasty was performed using a BALLOON WOLVERINE 3.75X10, followed by 4.0 [place] balloon.  Prox LAD lesion is 90% stenosed.  Post intervention, there is a 0% residual stenosis.  Continue IV fluids post procedure. He will need aggressive secondary prevention.   I stressed the importance of taking Brilinta 2x/day. He admits to occasionally missing the second dose due to cost. He states Plavix was stopped in the past, but he is not sure of the specifics.             ASSESSMENT:    1. Coronary artery disease involving native coronary artery of native heart without angina pectoris   2. CAD S/P multiple PCIs   3. Hyperlipidemia LDL goal <70   4. OSA on CPAP   5. CKD (chronic kidney disease), stage IV ([place])   6. Essential hypertension   7. Morbid obesity ([place])   8. Diabetes mellitus with complication [facility])     PLAN:  1.  CAD: Status post multiple PCI's with initial symptomatology in the setting of acute coronary syndrome November 2012.  In September 2020 he underwent his most recent intervention.  Subsequently he has been chest pain-free and continues to be on aspirin/Brilinta DAPT and should continue DAPT indefinitely.  2.  Essential hypertension: He is on significant multimedical regimen consisting of amlodipine 10 mg, furosemide 80 mg, hydralazine 100 mg 3 times a day, isosorbide 120 mg twice a day and metoprolol succinate 150 mg daily.  3.  Obstructive sleep apnea: I obtained  a new download today he was meeting compliance with usage days at 90%.  However usage is suboptimal at only 5 hours and 33 minutes.  I discussed the importance of using CPAP for minimum of 7 to 8 hours per night.  At his reduced set pressure of only 9 cm AHI is increased at 15.1.  I am changing him to a auto mode with a minimum pressure of 10 up to  a maximum of 20.  I am also reducing his ramp time to 10 minutes and increasing his ramp start pressure at 6 cm.  4.  Hyperlipidemia: Target LDL less than 70.  He is now on rosuvastatin 40 mg.  Most recent LDL cholesterol was 69.  I have elected to add Zetia 10 mg and attempt to reduce further his LDL cholesterol to 50 or below and attempt to induce plaque regression and hopefully prevent further restenosis.  5.Stage 4 chronic kidney disease: Most recent creatinine on October 18, 2019 2.82.  He is followed by nephrology who has been adjusting his diuretic regimen.  6.  Diabetes mellitus with complication: History of poorly controlled diabetes with prior history of DKA.  Currently on insulin.  7.  Right transmetatarsal amputation: Making it difficult for him to walk.  8.  Morbid obesity: BMI 46.84.  Weight loss and increased activity and exercise was strongly recommended.  We discussed Mediterranean diet versus plant-based diet.  I will see him in 3 months for follow-up evaluation   Medication Adjustments/Labs and Tests Ordered: Current medicines are reviewed at length with the patient today.  Concerns regarding medicines are outlined above.  Medication changes, Labs and Tests ordered today are listed in the Patient Instructions below. Patient Instructions  Medication Instructions:  Your physician has recommended you make the following change in your medication:   START EZETIMIBE (ZETIA) 10 MG BY MOUTH DAILY  *If you need a refill on your cardiac medications before your next appointment, please call your pharmacy*  Lab Work: Your physician recommends that you return for lab work in [place]    If you have labs (blood work) drawn today and your tests are completely normal, you will receive your results only by:  [place] (if you have MyChart) OR  A paper copy in the mail If you have any lab test that is abnormal or we need to  change your treatment, we will call you to review the results.  Testing/Procedures: NONE  Follow-Up: At [facility], you and your health needs are our priority.  As part of our continuing mission to provide you with exceptional heart care, we have created designated Provider Care Teams.  These Care Teams include your primary Cardiologist (physician) and Advanced Practice Providers (APPs -  Physician Assistants and Nurse Practitioners) who all work together to provide you with the care you need, when you need it.  Your next appointment:   3 month(s)  The format for your next appointment:   In Person  Provider:   [name], MD  Other Instructions YOUR CPAP [place]     Signed, [name], MD  11/21/2019 3:36 PM    [facility] [address] [place], [place], [place]  [address] Phone: [phone]

## 2019-11-21 NOTE — Progress Notes (Signed)
[phone] 

## 2019-11-23 ENCOUNTER — Ambulatory Visit (HOSPITAL_COMMUNITY): Payer: Medicaid Other

## 2020-01-21 ENCOUNTER — Ambulatory Visit: Payer: Medicaid Other | Admitting: Internal Medicine

## 2020-01-24 ENCOUNTER — Other Ambulatory Visit: Payer: Self-pay

## 2020-01-28 ENCOUNTER — Ambulatory Visit: Payer: Medicaid Other | Admitting: Internal Medicine

## 2020-01-28 ENCOUNTER — Other Ambulatory Visit: Payer: Self-pay

## 2020-01-28 ENCOUNTER — Encounter: Payer: Self-pay | Admitting: Internal Medicine

## 2020-01-28 VITALS — BP 136/60 | HR 82 | Ht 69.0 in | Wt 319.0 lb

## 2020-01-28 DIAGNOSIS — E1159 Type 2 diabetes mellitus with other circulatory complications: Secondary | ICD-10-CM

## 2020-01-28 DIAGNOSIS — E1165 Type 2 diabetes mellitus with hyperglycemia: Secondary | ICD-10-CM

## 2020-01-28 LAB — POCT GLYCOSYLATED HEMOGLOBIN (HGB A1C): Hemoglobin A1C: 13.8 % — AB (ref 4.0–5.6)

## 2020-01-28 NOTE — Patient Instructions (Addendum)
Please increase: - Lantus to 25 units a day at betime - NovoLog 10-15 units before meals (15 min before you eat)  Please start: - Ozempic 0.25 mg weekly in a.m. (for example on Sunday morning) x 4 weeks, then increase to 0.5 mg weekly in a.m. if no nausea or hypoglycemia.  Please schedule an appt with Antonieta Iba with nutrition.  Check sugars 4x day.  Please come back for a follow-up appointment in 1.5 months.  PATIENT INSTRUCTIONS FOR TYPE 2 DIABETES:  DIET AND EXERCISE Diet and exercise is an important part of diabetic treatment.  We recommended aerobic exercise in the form of brisk walking (working between 40-60% of maximal aerobic capacity, similar to brisk walking) for 150 minutes per week (such as 30 minutes five days per week) along with 3 times per week performing 'resistance' training (using various gauge rubber tubes with handles) 5-10 exercises involving the major muscle groups (upper body, lower body and core) performing 10-15 repetitions (or near fatigue) each exercise. Start at half the above goal but build slowly to reach the above goals. If limited by weight, joint pain, or disability, we recommend daily walking in a swimming pool with water up to waist to reduce pressure from joints while allow for adequate exercise.    BLOOD GLUCOSES Monitoring your blood glucoses is important for continued management of your diabetes. Please check your blood glucoses 2-4 times a day: fasting, before meals and at bedtime (you can rotate these measurements - e.g. one day check before the 3 meals, the next day check before 2 of the meals and before bedtime, etc.).   HYPOGLYCEMIA (low blood sugar) Hypoglycemia is usually a reaction to not eating, exercising, or taking too much insulin/ other diabetes drugs.  Symptoms include tremors, sweating, hunger, confusion, headache, etc. Treat IMMEDIATELY with 15 grams of Carbs: . 4 glucose tablets .  cup regular juice/soda . 2 tablespoons  raisins . 4 teaspoons sugar . 1 tablespoon honey Recheck blood glucose in 15 mins and repeat above if still symptomatic/blood glucose <100.  RECOMMENDATIONS TO REDUCE YOUR RISK OF DIABETIC COMPLICATIONS: * Take your prescribed MEDICATION(S) * Follow a DIABETIC diet: Complex carbs, fiber rich foods, (monounsaturated and polyunsaturated) fats * AVOID saturated/trans fats, high fat foods, >2,300 mg salt per day. * EXERCISE at least 5 times a week for 30 minutes or preferably daily.  * DO NOT SMOKE OR DRINK more than 1 drink a day. * Check your FEET every day. Do not wear tightfitting shoes. Contact us if you develop an ulcer * See your EYE doctor once a year or more if needed * Get a FLU shot once a year * Get a PNEUMONIA vaccine once before and once after age 12 years  GOALS:  * Your Hemoglobin A1c of <7%  * fasting sugars need to be <130 * after meals sugars need to be <180 (2h after you start eating) * Your Systolic BP should be 157 or lower  * Your Diastolic BP should be 80 or lower  * Your HDL (Good Cholesterol) should be 40 or higher  * Your LDL (Bad Cholesterol) should be 100 or lower. * Your Triglycerides should be 150 or lower  * Your Urine microalbumin (kidney function) should be <30 * Your Body Mass Index should be 25 or lower    Please consider the following ways to cut down carbs and fat and increase fiber and micronutrients in your diet: - substitute whole grain for white bread or pasta -  substitute brown rice for white rice - substitute 90-calorie flat bread pieces for slices of bread when possible - substitute sweet potatoes or yams for white potatoes - substitute humus for margarine - substitute tofu for cheese when possible - substitute almond or rice milk for regular milk (would not drink soy milk daily due to concern for soy estrogen influence on breast cancer risk) - substitute dark chocolate for other sweets when possible - substitute water - can add lemon or  orange slices for taste - for diet sodas (artificial sweeteners will trick your body that you can eat sweets without getting calories and will lead you to overeating and weight gain in the long run) - do not skip breakfast or other meals (this will slow down the metabolism and will result in more weight gain over time)  - can try smoothies made from fruit and almond/rice milk in am instead of regular breakfast - can also try old-fashioned (not instant) oatmeal made with almond/rice milk in am - order the dressing on the side when eating salad at a restaurant (pour less than half of the dressing on the salad) - eat as little meat as possible - can try juicing, but should not forget that juicing will get rid of the fiber, so would alternate with eating raw veg./fruits or drinking smoothies - use as little oil as possible, even when using olive oil - can dress a salad with a mix of balsamic vinegar and lemon juice, for e.g. - use agave nectar, stevia sugar, or regular sugar rather than artificial sweateners - steam or broil/roast veggies  - snack on veggies/fruit/nuts (unsalted, preferably) when possible, rather than processed foods - reduce or eliminate aspartame in diet (it is in diet sodas, chewing gum, etc) Read the labels!  Try to read Dr. Janene Harvey book: "Program for Reversing Diabetes" for other ideas for healthy eating.

## 2020-01-28 NOTE — Progress Notes (Signed)
Patient ID: Joshua [name], male   DOB: 29-Mar-1956, 64 y.o.   MRN: [id number]   This visit occurred during the SARS-CoV-2 public health emergency.  Safety protocols were in place, including screening questions prior to the visit, additional usage of staff PPE, and extensive cleaning of exam room while observing appropriate contact time as indicated for disinfecting solutions.   HPI: Joshua [name] is a 64 y.o.-year-old male, referred by his PCP, Dr. Alyson Ingles, for management of DM2, dx in 1990s, insulin-dependent, uncontrolled, with complications (CAD - s/p AMI 2012, s/p PCIs, ischemic cardiomyopathy, CHF, CKD stage 4, PN - s/p R transmetatarsal amputation, gastroparesis, history of DKA). He saw Dr Dwyane Dee initially, then Dr. Chalmers Cater for 20 years, but she is not accepting Medicaid patients anymore so he had to switch providers.  Reviewed latest HbA1c levels: Lab Results  Component Value Date   HGBA1C 10.2 (H) 08/16/2019   HGBA1C 15.0 (H) 04/07/2019   HGBA1C >15.5 (H) 03/15/2018   HGBA1C 11.8 (H) 05/19/2017   HGBA1C 8.5 (H) 11/17/2016   HGBA1C 9.5 (H) 07/18/2016   HGBA1C 10.8 (H) 02/16/2016   HGBA1C 14.2 (H) 10/13/2015   HGBA1C 10.0 (H) 07/03/2014   HGBA1C 16.2 (H) 10/09/2013   Pt is on a regimen of: - Lantus 20 units at bedtime - NovoLog 10 units 3x a day, before meals Prev. On 70/30 until Spring 2020.  Pt checks his sugars seldom  a day as his fingertips are hardened -he was checking more consistently up until 2 mo ago, but not since then: - am: 50, ave 130 - 2h after b'fast: n/c - before lunch: n/c - 2h after lunch: n/c - before dinner: n/c - 2h after dinner: n/c - bedtime: n/c - nighttime: n/c Lowest sugar was 50; he has hypoglycemia awareness at 100.  Highest sugar was 400.  Glucometer: ReliOn  Pt's meals are: - Breakfast: skips - Lunch: may skip, PB or chicken - Dinner: chicken w/o sides or with + veggies + starch;  - Snacks: occasional sweets: Hershey bar, sweet tea, cool  aid Diet sodas, water.  -+ CKD stage IV, last BUN/creatinine:  Lab Results  Component Value Date   BUN 35 (H) 08/23/2019   BUN 38 (H) 08/22/2019   CREATININE 2.78 (H) 08/23/2019   CREATININE 3.01 (H) 08/22/2019   Lab Results  Component Value Date   GFRAA 27 (L) 08/23/2019   GFRAA 25 (L) 08/22/2019   GFRAA 24 (L) 08/21/2019   GFRAA 22 (L) 08/20/2019   GFRAA 29 (L) 08/19/2019   Not on an ACE inhibitor/ARB  -+ HL last set of lipids: Lab Results  Component Value Date   CHOL 168 08/17/2019   HDL 85 08/17/2019   LDLCALC 71 08/17/2019   TRIG 60 08/17/2019   CHOLHDL 2.0 08/17/2019  On Crestor 40, Zetia 10.  - last eye exam was in 11/2019: No DR. Had cataract sx in 2014, 2015.  - + numbness and tingling in his feet.  On ASA 81.  Pt has FH of DM in GF.  He also has a history of HTN, OSA, GERD, esophageal ulcers.  No h/o pancreatitis or FH of MTC.  ROS: Constitutional: no weight gain, no weight loss, + fatigue, no subjective hyperthermia, no subjective hypothermia, + increased urination and nocturia, + poor sleep Eyes: no blurry vision, no xerophthalmia ENT: no sore throat, no nodules palpated in neck, no dysphagia, no odynophagia, no hoarseness, no tinnitus, no hypoacusis Cardiovascular: no CP, + SOB, no palpitations, + leg  swelling Respiratory: no cough, + SOB, no wheezing Gastrointestinal: no N, no V, no D, + C, + acid reflux Musculoskeletal: no muscle, + joint aches Skin: no rash, no hair loss Neurological: no tremors, no numbness or tingling/no dizziness/+ HAs Psychiatric: + Depression, no anxiety + Problems with erections  Past Medical History:  Diagnosis Date  . Anemia   . Chronic combined systolic and diastolic CHF (congestive heart failure) ([place])    a. 07/2015 Echo: EF 45-50%, mod LVH, mid apical/antsept AK, Gr 1 DD.  [name] CKD (chronic kidney disease), stage III   . Coronary artery disease    a. 2012 s/p MI/PCI: s/p DES to mid/dist RCA and overlapping DES to  LAD;  b. 07/2015 Cath: LAD 10% ISR, D1 40(jailed), LCX 23m OM2 30, OM3 30, RCA 556mSR, 10d ISR; c. 07/2016 NSTEMI/PCI: LAD 85ost/p/m (3.5x20 Synergy DES overlapping prior stent), D1 40, LCX 7538mM2 95 (2.75x16 Synergy DES), OM3 30, RCA 17m53md.  . Depression   . Diabetic gastroparesis (HCC)[place]. DKA (diabetic ketoacidoses) (HCC)[place]/01/2018  . GERD (gastroesophageal reflux disease)   . Gout   . Heart murmur   . Hyperlipemia   . Hypertensive heart disease   . Ischemic cardiomyopathy    a. 07/2015 Echo: EF 45-50%, mod LVH, mid-apicalanteroseptal AK, Gr1 DD.  . Morbid obesity (HCC)[place]. OSA on CPAP   . Osteomyelitis (HCC)[place] a. 07/2016 MTP joint of R great toe s/p transmetatarsal amputation.  . Polyneuropathy in diabetes(357.2)   . Pulmonary sarcoidosis (HCC)[place] "problems w/it years ago; no problems anymore" (07/03/2014)  . Rectal bleeding   . Sleep apnea   . Type II diabetes mellitus (HCC)[place] Past Surgical History:  Procedure Laterality Date  . AMPUTATION Right 07/24/2016   Procedure: TRANSMETATARSAL FOOT AMPUTATION;  Surgeon: MarcNewt Minion;  [facility];  Laterality: Right;  . BALLOON DILATION N/A 03/21/2018   Procedure: BALLOON DILATION;  Surgeon: BuccRonald Lobo;  [facility];  Service: Endoscopy;  Laterality: N/A;  . BREAST LUMPECTOMY Right ~ 2000   "benign"  . CARDIAC CATHETERIZATION N/A 07/30/2015   Procedure: Left Heart Cath and Coronary Angiography;  Surgeon: ChriBurnell Blanks;  [facility];  Service: Cardiovascular;  Laterality: N/A;  . CARDIAC CATHETERIZATION N/A 07/19/2016   Procedure: Left Heart Cath and Coronary Angiography;  Surgeon: HenrBelva Crome;  [facility];  Service: Cardiovascular;  Laterality: N/A;  . CARDIAC CATHETERIZATION N/A 07/29/2016   Procedure: Coronary Stent Intervention;  Surgeon: HenrBelva Crome;  [facility];  Service: Cardiovascular;  Laterality: N/A;   . CARDIAC CATHETERIZATION N/A 07/29/2016   Procedure: Intravascular Ultrasound/IVUS;  Surgeon: HenrBelva Crome;  [facility];  Service: Cardiovascular;  Laterality: N/A;  . CATARACT EXTRACTION W/ INTRAOCULAR LENS  IMPLANT, BILATERAL Bilateral 2014-2015  . COLONOSCOPY WITH PROPOFOL N/A 08/22/2017   Procedure: COLONOSCOPY WITH PROPOFOL;  Surgeon: SchoWilford Corner;  [facility];  Laterality: N/A;  . CORONARY ANGIOPLASTY WITH STENT PLACEMENT  10/31/2011   30 % MID ATRIOVENTRICULAR GROOVE CX STENOSIS. 90 % MID RCA.PTA TO LAD/STENTING OF TANDEM 60-70%. LAD STENOSIS 99% REDUCED TO 0%.3.0 X 32MM PROMUS DES POSTDILATED TO 3.25MM.  . COMarland KitchenONARY ANGIOPLASTY WITH STENT PLACEMENT  07/03/2014   "2"  . CORONARY STENT INTERVENTION N/A 04/09/2019   Procedure: CORONARY STENT INTERVENTION;  Surgeon: Jettie Booze, MD;  Location: [place] CV LAB;  Service: Cardiovascular;  Laterality: N/A;  . CORONARY STENT INTERVENTION N/A 08/22/2019   Procedure: CORONARY STENT INTERVENTION;  Surgeon: Martinique, Peter M, MD;  Location: [place] CV LAB;  Service: Cardiovascular;  Laterality: N/A;  . ESOPHAGOGASTRODUODENOSCOPY (EGD) WITH PROPOFOL N/A 03/21/2018   Procedure: ESOPHAGOGASTRODUODENOSCOPY (EGD) WITH PROPOFOL;  Surgeon: [name], MD;  Location: [facility];  Service: Endoscopy;  Laterality: N/A;  . I & D EXTREMITY Right 10/30/2013   Procedure: IRRIGATION AND DEBRIDEMENT RIGHT SMALL FINGER POSSIBLE SECONDARY WOUND CLOSURE;  Surgeon: [name], MD;  Location: [facility];  Service: Orthopedics;  Laterality: Right;  [name] is available after 4pm  . I & D EXTREMITY Right 11/01/2013   Procedure:  IRRIGATION AND DEBRIDEMENT RIGHT  PROXIMAL PHALANGEAL LEVEL AMPUTATION;  Surgeon: [name], MD;  Location: [facility];  Service: Orthopedics;  Laterality: Right;  . INCISION AND DRAINAGE Right 10/28/2013   Procedure: INCISION AND DRAINAGE Right small finger;   Surgeon: [name], MD;  [facility];  Service: Orthopedics;  Laterality: Right;  . LEFT HEART CATH Left 11/03/2011   Procedure: LEFT HEART CATH;  Surgeon: [name], MD;  Location: [facility] CATH LAB;  Service: Cardiovascular;  Laterality: Left;  . LEFT HEART CATH AND CORONARY ANGIOGRAPHY N/A 04/09/2019   Procedure: LEFT HEART CATH AND CORONARY ANGIOGRAPHY;  Surgeon: Jettie Booze, MD;  Location: [place] CV LAB;  Service: Cardiovascular;  Laterality: N/A;  . LEFT HEART CATHETERIZATION WITH CORONARY ANGIOGRAM N/A 07/03/2014   Procedure: LEFT HEART CATHETERIZATION WITH CORONARY ANGIOGRAM;  Surgeon: [name], MD;  Location: [facility] CATH LAB;  Service: Cardiovascular;  Laterality: N/A;  . PERCUTANEOUS CORONARY STENT INTERVENTION (PCI-S) Left 11/03/2011   Procedure: PERCUTANEOUS CORONARY STENT INTERVENTION (PCI-S);  Surgeon: [name], MD;  Location: [facility];  Service: Cardiovascular;  Laterality: Left;  . RIGHT/LEFT HEART CATH AND CORONARY ANGIOGRAPHY N/A 08/22/2019   Procedure: RIGHT/LEFT HEART CATH AND CORONARY ANGIOGRAPHY;  Surgeon: Martinique, Peter M, MD;  Location: [place] CV LAB;  Service: Cardiovascular;  Laterality: N/A;  . TOE AMPUTATION Right ~ 2010   "2nd toe"  . TOE AMPUTATION Right 2012   "3rd toe"   Social History   Socioeconomic History  . Marital status: Married    Spouse name: Not on file  . Number of children: Not on file  . Years of education: Not on file  . Highest education level: Not on file  Occupational History  . Not on file  Tobacco Use  . Smoking status: Never Smoker  . Smokeless tobacco: Never Used  Substance and Sexual Activity  . Alcohol use: Yes    Comment: social   . Drug use: No  . Sexual activity: Yes  Other Topics Concern  . Not on file  Social History Narrative  . Not on file   Social Determinants of Health   Financial Resource Strain:   . Difficulty of Paying Living Expenses: Not on file  Food Insecurity:    . Worried About Charity fundraiser in the Last Year: Not on file  . Ran Out of Food in the Last Year: Not on file  Transportation Needs:   . Lack of Transportation (Medical): Not on file  . Lack of Transportation (Non-Medical): Not on file  Physical Activity:   . Days of Exercise per Week: Not on file  . Minutes of Exercise per Session: Not on file  Stress:   . Feeling of Stress : Not on file  Social Connections:   . Frequency of Communication with Friends and Family: Not on file  . Frequency of Social Gatherings with Friends and Family: Not on file  . Attends Religious Services: Not on file  . Active Member of Clubs or Organizations: Not on file  . Attends Archivist Meetings: Not on file  . Marital Status: Not on file  Intimate Partner Violence:   . Fear of Current or Ex-Partner: Not on file  . Emotionally Abused: Not on file  . Physically Abused: Not on file  . Sexually Abused: Not on file   Current Outpatient Medications on File Prior to Visit  Medication Sig Dispense Refill  . Accu-Chek FastClix Lancets MISC USE UP TO FOUR TIMES DAILY AS DIRECTED    . ACCU-CHEK GUIDE test strip USE UP TO FOUR TIMES DAILY AS DIRECTED    . acetaminophen (TYLENOL) 650 MG CR tablet Take 1,300 mg by mouth daily as needed for pain.    [name] allopurinol (ZYLOPRIM) 300 MG tablet Take 300 mg by mouth daily.     [name] amLODipine (NORVASC) 10 MG tablet Take 1 tablet (10 mg total) by mouth daily.    [name] aspirin EC 81 MG tablet Take 81 mg by mouth daily.     . blood glucose meter kit and supplies Dispense based on patient and insurance preference. Use up to four times daily as directed. (FOR ICD-10 E10.9, E11.9). 1 each 0  . BRILINTA 90 MG TABS tablet Take 1 tablet by mouth twice daily 180 tablet 1  . Camphor-Menthol-Methyl Sal (HM SALONPAS PAIN RELIEF EX) Apply 1 spray topically daily as needed (pain).    . cyclobenzaprine (FLEXERIL) 10 MG tablet Take 10 mg by mouth as directed.    . ezetimibe (ZETIA)  10 MG tablet Take 1 tablet (10 mg total) by mouth daily. 90 tablet 3  . ferrous sulfate 324 MG TBEC Take 324 mg by mouth every morning.    . furosemide (LASIX) 80 MG tablet Take 80 mg by mouth.    . hydrALAZINE (APRESOLINE) 100 MG tablet Take 100 mg by mouth 3 (three) times daily.    . insulin aspart (NOVOLOG) 100 UNIT/ML injection Inject 6 Units into the skin 3 (three) times daily with meals. (Patient taking differently: Inject 10-12 Units into the skin 2 (two) times daily. Sliding scale) 10 mL 11  . insulin glargine (LANTUS) 100 UNIT/ML injection Inject 0.2 mLs (20 Units total) into the skin 2 (two) times daily. 10 mL 11  . isosorbide mononitrate (IMDUR) 120 MG 24 hr tablet Take 1 tablet (120 mg total) by mouth 2 (two) times daily.    . nitroGLYCERIN (NITROSTAT) 0.4 MG SL tablet Place 1 tablet (0.4 mg total) under the tongue every 5 (five) minutes as needed for chest pain. 25 tablet 1  . pantoprazole (PROTONIX) 40 MG tablet TAKE 1 TABLET BY MOUTH TWICE DAILY BEFORE MEAL(S) 180 tablet 0  . rosuvastatin (CRESTOR) 40 MG tablet Take 1 tablet by mouth once daily 30 tablet 0  . metoprolol succinate (TOPROL-XL) 50 MG 24 hr tablet Take 3 tablets (150 mg total) by mouth daily. Take with or immediately following a meal. 90 tablet 0  . Sulfamethoxazole-Trimethoprim (BACTRIM PO) Take by mouth.    . traMADol (ULTRAM) 50 MG tablet Take 1-2 tablets (50-100 mg total) by mouth every 6 (six) hours as needed. (Patient not taking: Reported on 01/28/2020) 20 tablet 0  No current facility-administered medications on file prior to visit.   Allergies  Allergen Reactions  . Chlorthalidone Other (See Comments)    Causes dehydration, kidneys shut down   Family History  Problem Relation Age of Onset  . Heart attack Maternal Aunt   . Diabetes Maternal Grandmother   . Hypertension Maternal Grandmother     PE: BP 136/60   Pulse 82   Ht '5\' 9"'  (1.753 m)   Wt (!) 319 lb (144.7 kg)   SpO2 97%   BMI 47.11 kg/m  Wt  Readings from Last 3 Encounters:  01/28/20 (!) 319 lb (144.7 kg)  11/21/19 (!) 317 lb 3.2 oz (143.9 kg)  09/11/19 (!) 310 lb 3.2 oz (140.7 kg)   Constitutional: Obese, in NAD Eyes: PERRLA, EOMI, no exophthalmos ENT: moist mucous membranes, no thyromegaly, no cervical lymphadenopathy Cardiovascular: RRR, No MRG, + bilateral LE swelling Respiratory: CTA B Gastrointestinal: abdomen soft, NT, ND, BS+ Musculoskeletal: Right transmetatarsal amputation, strength intact in all 4 Skin: moist, warm, no rashes Neurological: no tremor with outstretched hands, DTR normal in all 4  ASSESSMENT: 1. DM2, insulin-dependent, uncontrolled, with complications - CAD - s/p AMI 2012, s/p multiple PCI's; ischemic cardiomyopathy; CHF. Dr. Claiborne Billings - CKD stage 4 - PN - s/p R transmetatarsal amputation - Gastroparesis - History of DKA  PLAN:  1. Patient with long-standing, uncontrolled diabetes, on basal-bolus insulin regimen, which became insufficient.  Latest HbA1c was 10.2% in 08/2019, which was an improvement from 03/2019 when his HbA1c was 15%. However, today: higher, 13.8%. - At this visit, he tells me he is not checking sugars as his fingers hips are higher and he has problems getting blood out.  He did try to get a CGM in the past but was told that this was not covered by his insurance.  We discussed that, for this to be covered, he needs to check his sugars 4 times a day and to take 3 insulin injections.  He is meeting the second criteria and I advised him that before next visit, to start checking sugars consistently so hopefully we can get him a CGM. -He is taking relatively low doses of Lantus and NovoLog compared to his HbA1c and implied blood sugars.  We will increase both today but it is difficult to adjust the doses without more data.  We do not have many options treatment escalation other than increasing insulin doses, except a weekly GLP-1 receptor agonist, which would be excellent for him to improve his  cardiovascular risk factor.  He does have a history of gastroparesis, however, these medicines may be tolerated well in some patients with this condition so I think it is still worth trying.  I am not sure whether Ozempic would be covered by his insurance but we did discuss about the options of using Bydureon or even daily Victoza if we need to, and he agrees with this. -We decreased about improving his diet.  He is eating "a lot" of chicken, which I explained has a very high cholesterol content and discussed about transitioning to a more plant-based diet, which was already suggested by cardiology.  I gave him specific examples of what he can eat and also given written suggestions.  He also accepts a referral to nutrition at this visit.  Strongly advised him to eliminate sweet drinks, which he occasionally drinks. - I suggested to:  Patient Instructions  Please increase: - Lantus to 25 units a day at betime - NovoLog 10-15 units before meals (15  min before you eat)  Please start: - Ozempic 0.25 mg weekly in a.m. (for example on Sunday morning) x 4 weeks, then increase to 0.5 mg weekly in a.m. if no nausea or hypoglycemia.  Please schedule an appt with Antonieta Iba with nutrition.  Check sugars 4x day.  Please come back for a follow-up appointment in 1.5 months.  - Strongly advised him to start checking sugars at different times of the day - check 3x a day, rotating checks - discussed about CBG targets for treatment: 80-130 mg/dL before meals and <180 mg/dL after meals; target HbA1c <7%. - given sugar log and advised how to fill it and to bring it at next appt  - given foot care handout and explained the principles  - given instructions for hypoglycemia management "15-15 rule"  - advised for yearly eye exams  - Return to clinic in 1.5 mo with sugar log   Philemon Kingdom, MD PhD [facility] Endocrinology

## 2020-02-13 ENCOUNTER — Telehealth: Payer: Self-pay | Admitting: Internal Medicine

## 2020-02-13 NOTE — Telephone Encounter (Signed)
MEDICATION: ozempic  PHARMACY:  [place], [place] Phone:  [phone]  Fax:  [phone]      IS THIS A 90 DAY SUPPLY : patient unsure  IS PATIENT OUT OF MEDICATION:   IF NOT; HOW MUCH IS LEFT:   LAST APPOINTMENT DATE: @2 /15/2021  NEXT APPOINTMENT DATE:@3 /31/2021  DO WE HAVE YOUR PERMISSION TO LEAVE A DETAILED MESSAGE: yes  OTHER COMMENTS: patient states that he was supposed to start ozempic - they discussed this at his last visit, and he is scheduled in a couple weeks and needs to have been taking it for their follow up appointment.   **Let patient know to contact pharmacy at the end of the day to make sure medication is ready. **  ** Please notify patient to allow 48-72 hours to process**  **Encourage patient to contact the pharmacy for refills or they can request refills through [facility]**

## 2020-02-15 MED ORDER — OZEMPIC (0.25 OR 0.5 MG/DOSE) 2 MG/1.5ML ~~LOC~~ SOPN: 0.2500 mg | PEN_INJECTOR | SUBCUTANEOUS | 4 refills | Status: DC

## 2020-02-15 NOTE — Addendum Note (Signed)
Addended by: Cardell Peach I on: 02/15/2020 11:36 AM   Modules accepted: Orders

## 2020-02-15 NOTE — Telephone Encounter (Signed)
RX sent

## 2020-02-27 ENCOUNTER — Other Ambulatory Visit: Payer: Self-pay

## 2020-02-27 ENCOUNTER — Ambulatory Visit: Payer: Medicaid Other | Admitting: Cardiovascular Disease

## 2020-02-27 ENCOUNTER — Encounter: Payer: Self-pay | Admitting: Cardiovascular Disease

## 2020-02-27 VITALS — BP 122/68 | HR 70 | Temp 96.8°F | Ht 69.0 in | Wt 313.0 lb

## 2020-02-27 DIAGNOSIS — G4733 Obstructive sleep apnea (adult) (pediatric): Secondary | ICD-10-CM | POA: Diagnosis not present

## 2020-02-27 DIAGNOSIS — E1142 Type 2 diabetes mellitus with diabetic polyneuropathy: Secondary | ICD-10-CM

## 2020-02-27 DIAGNOSIS — E785 Hyperlipidemia, unspecified: Secondary | ICD-10-CM

## 2020-02-27 DIAGNOSIS — I2510 Atherosclerotic heart disease of native coronary artery without angina pectoris: Secondary | ICD-10-CM

## 2020-02-27 DIAGNOSIS — Z9989 Dependence on other enabling machines and devices: Secondary | ICD-10-CM

## 2020-02-27 DIAGNOSIS — I10 Essential (primary) hypertension: Secondary | ICD-10-CM

## 2020-02-27 DIAGNOSIS — I25119 Atherosclerotic heart disease of native coronary artery with unspecified angina pectoris: Secondary | ICD-10-CM

## 2020-02-27 DIAGNOSIS — Z9861 Coronary angioplasty status: Secondary | ICD-10-CM | POA: Diagnosis not present

## 2020-02-27 DIAGNOSIS — N184 Chronic kidney disease, stage 4 (severe): Secondary | ICD-10-CM

## 2020-02-27 NOTE — Progress Notes (Signed)
Cardiology Office Note    Date:  02/29/2020   ID:  Joshua [name], DOB [birth year], MRN [id number]  PCP:  Joshua Dus, MD  Cardiologist:  Joshua Majestic, MD   Chief Complaint  Patient presents with  . Follow-up    3 months.  . Shortness of Breath  . Edema   3 month F/U  History of Present Illness:  Joshua [name] is a 64 y.o. male who suffered an acute coronary syndrome on 10/31/2011 and presented with ECG findings of anterolateral ST segment elevation. Cardiac catheterization revealed a 99% mid LAD stenosis and proximal to this there was a 60-70% stenosis after the first diagonal vessel. He also had 90-95% mid RCA stenosis and 30% circumflex stenosis. He underwent initial acute intervention to his LAD and a 3.0x32 mm Promus DES stent was inserted. 3 days later he underwent successful staged PCI to his 95% RCA stenosis and a 3.0x18 mm Resolute DES stent was inserted and post dilated 3.5 mm.   Additional problems include at least a >30 year history of hypertension, a 25 year history of diabetes mellitus, obesity, as well as hyperlipidemia.  He was admitted to the hospital on 07/29/2015 with complaints of shortness of breath similar to his initial symptomatology. He had mild renal insufficiency, was hydrated and underwent cardiac catheterization by Dr. Julianne [name] in 07/30/2015. His LAD stents were patent. There was mild 40% first agonal stenosis, 60% circumflex stenosis, and his RCA stents had a 50% intimal narrowing in its mid stent without significant narrowing in the distal stent. An echo Doppler study on 07/31/2015 showed an EF of 45-50% with moderate left ventricular hypertrophy and grade 1 diastolic dysfunction. He was seen by Joshua [name] on 08/08/2015 for initial posthospital evaluation. Since that time, he has had some vague symptoms of GERD. He is not on any proton pump inhibitor. He gets shortness of breath with minimal activity including brushing his teeth and taking a  shower. He has not been routinely exercising. He has obstructive sleep apnea and uses CPAP therapy. He denies residual snoring. He works at the [employer] in the night shift. At times he feels that some of his food may get stuck when swallowing but this is not consistent.   When I saw him in September 2016, I started him on Protonix for significant GERD symptoms which has improved. We discussed weight loss and exercise. Laboratory showed worsening renal function with a creatinine of 1.84, significant glucose elevation at 669 with probable factitious hyponatremia at 129, due to high glucose levels. His hemoglobin A1c was 14.2. I referred him to Dr. Michiel [name] for endocrinologic evaluation. I also discontinued his spironolactone and added hydralazine 25 mg twice a day for more optimal blood pressure control in light of his renal insufficiency. We discussed the importance of weight loss.  When I saw him in the office in March 2017, he has been in a cast and boot in his right leg due to prior infection of his right toe. He did undergo hyperbaric chamber therapy. He tells me he was initially told of having borderline osteomyelitis. Four weeks ago his cultures were negative. He subsequently required right metatarsal amputation for osteomyelitis in August 2017. In August he was admitted to [facility] with unstable angina and ruled in for a non-STEM>Repeat catheterization showed new eccentric proximal LAD stenosis, not involving the previously placed overlapping mid LAD stents, eccentric 75% mid left circumflex stenosis between OM1 and OM 2, and progression of OM1 stenosis  to 95%. He ultimately underwent stenting to his LAD and circumflex marginal vessel with Synergy stents. He was readmitted in October 2017 with recurrent chest pain which was felt to be atypical and musculoskeletal. A 2 day protocol nuclear stress test showed an EF of 39% without ischemia. He was seen in office  follow-up by Joshua Deforest, [name] on 10/22/2016.   When I saw him in December 2017, he denied any recurrent episodes of chest pain. His sleep was poor and he felt tired. Over 10 years ago he was evaluated for sleep apnea but had only used CPAP for 6 months. I referred him for a sleep study which was done on 01/07/2017. Revealed severe obstructive sleep apnea with an AHI of 41.6/h. He had severe oxygen desaturation to a nadir of 77%. He had increased periodic limb movements with an index of 25.15. On the diagnostic portion of the study there was absence of REM sleep. During the CPAP titration there was evidence for REM rebound, such that he slept 32.6% in REM sleep.   When I  saw him in 2018, he was on CPAP for 6 weeks and felt significantly improved. I obtained a download of his CPAP unit today from 03/07/2017 through 04/05/2017. He is 100% compliant with usage stays and usage greater than 4 hours and is averaging 8 hours and 4 minutes of sleep per night. He is set at a 17 cm pressure his AHI is excellent at 1.5. He does have a moderate leak. He uses a fullface mask. He denies any chest pain. He denies palpitations. He had noticed recently that his blood pressure is elevated.   When I saw him in June 2018, he was able to get a new job. 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 His work is from 7 PM until 7 AM. Typically he would go home and get to sleep between 8:39 AM. Depending upon if he had to pick his son up, he would either sleep until 5 PM or if he had to get his son up would have to wake up in the early afternoon. He denies exertionally precipitated chest pain but has noticed some sharp fleeting atypical sensations. There was confusion with his medications and he had run out of his metoprolol succinate which he was taking 150 mg daily and nitrates A new download was obtained in the office today from July 15 through 07/25/2017. He did have machine malfunction  contribute to reduce compliance. However, at a 17 cm pressure. AHI was excellent at 0.4. An Epworth Sleepiness Scale score was recalculated and this endorsed at 7.  Since September 2018 he was using CPAP more consistently and a download from September 25 through 10/05/2017 showed 83%. Usage stays. However, usage greater than 4 hours was only 67%. He was averaging 5 hours and 36 minutes. At 17 cm set pressure. AHI was excellent at 0.8. He did have a moderate mask leak. He had not use CPAP for the last 3 weeks due to significant sinus congestion. He works from 7 PM to 7 AM. He typically goes to bed at 8 AM until 4 PM. He has noticed mild leg swelling. He also has had issues with diabetes. He was seen in the emergency room on 11/21/2017 with right-sided flank pain. When he presented with GI symptoms and vomiting. He denies any anginal symptomatology. He has not been successful with weight loss.   Since the last office visit, patient was admitted with DKA and severe dehydration in  the setting of uncontrolled type II DM in April 2019. On arrival his creatinine was 5.25, CBG of 1241, aniongap was over 35. Hemoglobin A1c was greater than 15.5. On further discussion, he apparently lost his job and was not taking the insulin for more than 2-1/2 weeks.  He was seen by Joshua Deforest, PAC in December 2019, and was complaining of exertional chest discomfort for 2 months. Therefore we recommended outpatient cardiac cath however his renal function was down and creatinine came back 2.2. Cardiac catheterization was delayed. I stopped his diuretic at the repeated the lab work, creatinine still came back around 2.1. I discussed the case with Dr. Claiborne Billings and we eventually decided to use Lexiscan stress test to risk stratify. I also increase his Imdur to 120 mg twice daily for antianginal purposes. I increase his hydralazine to 3 times a day for better control. Myoview was obtained on 12/26/2018 which  showed EF 33%, medium sized moderate intensity severe fixed septal and anteroseptal perfusion defect which could represent scar or artifact, no significant reversible ischemia noted. Overall this was a high risk study due to decreased LVEF which has been reduced from the previous study in 2017.  Patient presents today for cardiology office visit with Isaac Laud and was have some chest tightness recently, especially with swallowing. He says he ran out of his pantoprazole that usually control as reflux. I am okay to give him 30-monthsupply of Protonix, however if he needs more he will need to discuss with his PCP. He also ran out of  Imdur 2 days ago as well, who refilled this medication. Otherwise, because he has some discomfort with swallowing, he has not been eating very well therefore has lost significant amount of weight. He also says he has been urinating more on the same dose of diuretic, I will defer the management of his renal function to his nephrologist. As far as his blood sugar, he says some days his blood sugar would be down to 70s, whereas other days, his blood sugar is quite high. Even on the previous lab work in December, his glucose ranges between 63-561. This is being managed by her primary care provider as well.   He had done relatively well until recentlybut apparently due to increasing recurrent episodes of nocturnal chest pain he was advised  to present to the hospital and was admitted on April 06, 2019.  He ruled in for non-STEMI. Creatinine was 2.01 on admission.  He underwent cardiac catheterization on April 09, 2019 and was found to have 90% in-stent restenosis of his proximal LAD stent.  His catheterization findings are detailed below in the CV studies section of this note.  He was discharged the following day and at discharge creatinine was 2.01, similar to admission.  I saw him for telemedicine evaluation on April 12, 2019 and since hospital discharge, he has felt well and  denies any of his previous anginal symptomatology.  He denied PND orthopnea. He had not been using his CPAP due to the sensation that he felt the pressures were blowing too high.  Remotely he was found to have severe sleep apnea which markedly normalized with his set 17 cm pressure.  However, over the last month a download reveals only 1 day of use for only 3.31 hours.  Since I last saw him, adjustments were made to lower his CPAP pressure and he therefore began using therapy.  He developed recurrent chest pain symptomatology in early September 2020 and repeat catheterization demonstrated 95% proximal  LAD lesion treated with a 3.5 x 20 mm Synergy DES stent.  There was 50% mid left circumflex residual narrowing and 40% ostial diagonal 1 stenosis.  He has continued to be followed by [name] of nephrology.  His diuretic was increased to Lasix 80 mg daily.  I last saw him in December 2020 at which time he was without  recurrent anginal symptomatology since his September 2020 intervention.  He does have some difficulty walking since his right transmetatarsal amputation in 2017 and has a prosthesis in the foot making walking harder.  I obtained a download of his CPAP unit today which shows he is meeting compliance with usage at 90%.  However, usage is only 5 hours and 33 minutes per night.  At his reduced set pressure of only 9 cm, AHI was now increased at 15.1.  During that evaluation I changed him to a CPAP auto mode with a minimum pressure of 10 to a maximum of 20.  I reduced his ramp time to 10 minutes and increased his ramp start pressure to 6 cm.  Gust the importance of weight loss with his morbid obesity.  Over the last 3 months, he has continued to be without anginal symptomatology.  He is now seeing Dr. Beverly Sessions for his diabetes mellitus has a follow-up appointment later this month.  He is followed by Dr. Madelon Lips for his renal disease and had previously been told to double his furosemide  80 mg for 2 days with recurrent leg swelling.  Will be seeing her for follow-up laboratory.  He denies recent bleeding.  He does have some gait instability with his metatarsal amputation.  He continues to be on  rosuvastatin 40 mg and Zetia 10 mg for hyperlipidemia.  He continues to be on DAPT aspirin/Brilinta for CAD.    Download was obtained in the office today.  The download from February 15 through March 16 only showed 1 day of use and he admits that he has not used treatment over the past month.  Download from December 28 through February 07, 2020 showed usage at 5 hours and 25 minutes but he was not meeting compliance with only 39 of 60 days of use and usage greater than 4 hours at only 43%.  AHI was 11.7.  He was having significant mask leak.  He presents for reevaluation.   Past Medical History:  Diagnosis Date  . Anemia   . Chronic combined systolic and diastolic CHF (congestive heart failure) ([place])    a. 07/2015 Echo: EF 45-50%, mod LVH, mid apical/antsept AK, Gr 1 DD.  [name] CKD (chronic kidney disease), stage III   . Coronary artery disease    a. 2012 s/p MI/PCI: s/p DES to mid/dist RCA and overlapping DES to LAD;  b. 07/2015 Cath: LAD 10% ISR, D1 40(jailed), LCX 36m OM2 30, OM3 30, RCA 539mSR, 10d ISR; c. 07/2016 NSTEMI/PCI: LAD 85ost/p/m (3.5x20 Synergy DES overlapping prior stent), D1 40, LCX 7557mM2 95 (2.75x16 Synergy DES), OM3 30, RCA 91m25md.  . Depression   . Diabetic gastroparesis (HCC)[place]. DKA (diabetic ketoacidoses) (HCC)[employer]/01/2018  . GERD (gastroesophageal reflux disease)   . Gout   . Heart murmur   . Hyperlipemia   . Hypertensive heart disease   . Ischemic cardiomyopathy    a. 07/2015 Echo: EF 45-50%, mod LVH, mid-apicalanteroseptal AK, Gr1 DD.  . Morbid obesity (HCC)[place]. OSA on CPAP   . Osteomyelitis (HCC)[place]  a. 07/2016 MTP joint of R great toe s/p transmetatarsal amputation.  . Polyneuropathy in diabetes(357.2)   . Pulmonary sarcoidosis ([place])    "problems w/it  years ago; no problems anymore" (07/03/2014)  . Rectal bleeding   . Sleep apnea   . Type II diabetes mellitus ([place])     Past Surgical History:  Procedure Laterality Date  . AMPUTATION Right 07/24/2016   Procedure: TRANSMETATARSAL FOOT AMPUTATION;  Surgeon: [name], MD;  Location: [place];  Service: Orthopedics;  Laterality: Right;  . BALLOON DILATION N/A 03/21/2018   Procedure: BALLOON DILATION;  Surgeon: [name], MD;  Location: [facility] ENDOSCOPY;  Service: Endoscopy;  Laterality: N/A;  . BREAST LUMPECTOMY Right ~ 2000   "benign"  . CARDIAC CATHETERIZATION N/A 07/30/2015   Procedure: Left Heart Cath and Coronary Angiography;  Surgeon: Burnell Blanks, MD;  Location: [place] CV LAB;  Service: Cardiovascular;  Laterality: N/A;  . CARDIAC CATHETERIZATION N/A 07/19/2016   Procedure: Left Heart Cath and Coronary Angiography;  Surgeon: Belva Crome, MD;  Location: [place] CV LAB;  Service: Cardiovascular;  Laterality: N/A;  . CARDIAC CATHETERIZATION N/A 07/29/2016   Procedure: Coronary Stent Intervention;  Surgeon: Belva Crome, MD;  Location: [place] CV LAB;  Service: Cardiovascular;  Laterality: N/A;  . CARDIAC CATHETERIZATION N/A 07/29/2016   Procedure: Intravascular Ultrasound/IVUS;  Surgeon: Belva Crome, MD;  Location: [place] CV LAB;  Service: Cardiovascular;  Laterality: N/A;  . CATARACT EXTRACTION W/ INTRAOCULAR LENS  IMPLANT, BILATERAL Bilateral 2014-2015  . COLONOSCOPY WITH PROPOFOL N/A 08/22/2017   Procedure: COLONOSCOPY WITH PROPOFOL;  Surgeon: [name], MD;  Location: [place];  Service: Endoscopy;  Laterality: N/A;  . CORONARY ANGIOPLASTY WITH STENT PLACEMENT  10/31/2011   30 % MID ATRIOVENTRICULAR GROOVE CX STENOSIS. 90 % MID RCA.PTA TO LAD/STENTING OF TANDEM 60-70%. LAD STENOSIS 99% REDUCED TO 0%.3.0 X 32MM PROMUS DES POSTDILATED TO 3.25MM.  [name] CORONARY ANGIOPLASTY WITH STENT PLACEMENT  07/03/2014   "2"  . CORONARY STENT INTERVENTION N/A  04/09/2019   Procedure: CORONARY STENT INTERVENTION;  Surgeon: Jettie Booze, MD;  Location: [place] CV LAB;  Service: Cardiovascular;  Laterality: N/A;  . CORONARY STENT INTERVENTION N/A 08/22/2019   Procedure: CORONARY STENT INTERVENTION;  Surgeon: Martinique, Peter M, MD;  Location: [place] CV LAB;  Service: Cardiovascular;  Laterality: N/A;  . ESOPHAGOGASTRODUODENOSCOPY (EGD) WITH PROPOFOL N/A 03/21/2018   Procedure: ESOPHAGOGASTRODUODENOSCOPY (EGD) WITH PROPOFOL;  Surgeon: [name], MD;  Location: [facility] [place];  Service: Endoscopy;  Laterality: N/A;  . I & D EXTREMITY Right 10/30/2013   Procedure: IRRIGATION AND DEBRIDEMENT RIGHT SMALL FINGER POSSIBLE SECONDARY WOUND CLOSURE;  Surgeon: [name], MD;  Location: [facility];  Service: Orthopedics;  Laterality: Right;  [name] is available after 4pm  . I & D EXTREMITY Right 11/01/2013   Procedure:  IRRIGATION AND DEBRIDEMENT RIGHT  PROXIMAL PHALANGEAL LEVEL AMPUTATION;  Surgeon: [name], MD;  Location: [facility];  Service: Orthopedics;  Laterality: Right;  . INCISION AND DRAINAGE Right 10/28/2013   Procedure: INCISION AND DRAINAGE Right small finger;  Surgeon: [name], MD;  Location: [facility];  Service: Orthopedics;  Laterality: Right;  . LEFT HEART CATH Left 11/03/2011   Procedure: LEFT HEART CATH;  Surgeon: [name], MD;  Location: [facility] CATH LAB;  Service: Cardiovascular;  Laterality: Left;  . LEFT HEART CATH AND CORONARY ANGIOGRAPHY N/A 04/09/2019   Procedure: LEFT HEART CATH AND CORONARY ANGIOGRAPHY;  Surgeon: Larae Grooms  S, MD;  Location: [place] CV LAB;  Service: Cardiovascular;  Laterality: N/A;  . LEFT HEART CATHETERIZATION WITH CORONARY ANGIOGRAM N/A 07/03/2014   Procedure: LEFT HEART CATHETERIZATION WITH CORONARY ANGIOGRAM;  Surgeon: [name], MD;  Location: [facility] CATH LAB;  Service: Cardiovascular;  Laterality: N/A;  . PERCUTANEOUS CORONARY STENT INTERVENTION (PCI-S) Left  11/03/2011   Procedure: PERCUTANEOUS CORONARY STENT INTERVENTION (PCI-S);  Surgeon: [name], MD;  Location: [facility] CATH LAB;  Service: Cardiovascular;  Laterality: Left;  . RIGHT/LEFT HEART CATH AND CORONARY ANGIOGRAPHY N/A 08/22/2019   Procedure: RIGHT/LEFT HEART CATH AND CORONARY ANGIOGRAPHY;  Surgeon: Martinique, Peter M, MD;  Location: [place] CV LAB;  Service: Cardiovascular;  Laterality: N/A;  . TOE AMPUTATION Right ~ 2010   "2nd toe"  . TOE AMPUTATION Right 2012   "3rd toe"    Current Medications: Outpatient Medications Prior to Visit  Medication Sig Dispense Refill  . Accu-Chek FastClix Lancets MISC USE UP TO FOUR TIMES DAILY AS DIRECTED    . ACCU-CHEK GUIDE test strip USE UP TO FOUR TIMES DAILY AS DIRECTED    . acetaminophen (TYLENOL) 650 MG CR tablet Take 1,300 mg by mouth daily as needed for pain.    [name] allopurinol (ZYLOPRIM) 300 MG tablet Take 300 mg by mouth daily.     [name] amLODipine (NORVASC) 10 MG tablet Take 1 tablet (10 mg total) by mouth daily.    [name] aspirin EC 81 MG tablet Take 81 mg by mouth daily.     . blood glucose meter kit and supplies Dispense based on patient and insurance preference. Use up to four times daily as directed. (FOR ICD-10 E10.9, E11.9). 1 each 0  . BRILINTA 90 MG TABS tablet Take 1 tablet by mouth twice daily 180 tablet 1  . ezetimibe (ZETIA) 10 MG tablet Take 1 tablet (10 mg total) by mouth daily. 90 tablet 3  . furosemide (LASIX) 80 MG tablet Take 80 mg by mouth.    . hydrALAZINE (APRESOLINE) 100 MG tablet Take 100 mg by mouth 3 (three) times daily.    . insulin aspart (NOVOLOG) 100 UNIT/ML injection Inject 6 Units into the skin 3 (three) times daily with meals. (Patient taking differently: Inject 10-12 Units into the skin 2 (two) times daily. Sliding scale) 10 mL 11  . insulin glargine (LANTUS) 100 UNIT/ML injection Inject 0.2 mLs (20 Units total) into the skin 2 (two) times daily. 10 mL 11  . isosorbide mononitrate (IMDUR) 120 MG 24 hr tablet Take 1  tablet (120 mg total) by mouth 2 (two) times daily.    . nitroGLYCERIN (NITROSTAT) 0.4 MG SL tablet Place 1 tablet (0.4 mg total) under the tongue every 5 (five) minutes as needed for chest pain. 25 tablet 1  . pantoprazole (PROTONIX) 40 MG tablet TAKE 1 TABLET BY MOUTH TWICE DAILY BEFORE MEAL(S) 180 tablet 0  . rosuvastatin (CRESTOR) 40 MG tablet Take 1 tablet by mouth once daily 30 tablet 0  . Semaglutide,0.25 or 0.5MG/DOS, (OZEMPIC, 0.25 OR 0.5 MG/DOSE,) 2 MG/1.5ML SOPN Inject 0.25 mg into the skin once a week. 0.25 mg weekly in a.m. (for example on Sunday morning) x 4 weeks, then increase to 0.5 mg weekly in a.m. 1 pen 4  . Camphor-Menthol-Methyl Sal (HM SALONPAS PAIN RELIEF EX) Apply 1 spray topically daily as needed (pain).    . cyclobenzaprine (FLEXERIL) 10 MG tablet Take 10 mg by mouth as directed.    . ferrous sulfate 324 MG TBEC Take  324 mg by mouth every morning.    . Sulfamethoxazole-Trimethoprim (BACTRIM PO) Take by mouth.    . traMADol (ULTRAM) 50 MG tablet Take 1-2 tablets (50-100 mg total) by mouth every 6 (six) hours as needed. 20 tablet 0  . metoprolol succinate (TOPROL-XL) 50 MG 24 hr tablet Take 3 tablets (150 mg total) by mouth daily. Take with or immediately following a meal. 90 tablet 0   No facility-administered medications prior to visit.     Allergies:   Chlorthalidone   Social History   Socioeconomic History  . Marital status: Married    Spouse name: Not on file  . Number of children: Not on file  . Years of education: Not on file  . Highest education level: Not on file  Occupational History  . Not on file  Tobacco Use  . Smoking status: Never Smoker  . Smokeless tobacco: Never Used  Substance and Sexual Activity  . Alcohol use: Yes    Comment: social   . Drug use: No  . Sexual activity: Yes  Other Topics Concern  . Not on file  Social History Narrative  . Not on file   Social Determinants of Health   Financial Resource Strain:   . Difficulty of  Paying Living Expenses:   Food Insecurity:   . Worried About Charity fundraiser in the Last Year:   . Arboriculturist in the Last Year:   Transportation Needs:   . Film/video editor (Medical):   [name] Lack of Transportation (Non-Medical):   Physical Activity:   . Days of Exercise per Week:   . Minutes of Exercise per Session:   Stress:   . Feeling of Stress :   Social Connections:   . Frequency of Communication with Friends and Family:   . Frequency of Social Gatherings with Friends and Family:   . Attends Religious Services:   . Active Member of Clubs or Organizations:   . Attends Archivist Meetings:   [name] Marital Status:     Previously had worked at [employer]  Family History:  The patient's family history includes Diabetes in his maternal grandmother; Heart attack in his maternal aunt; Hypertension in his maternal grandmother.   ROS General: Negative; No fevers, chills, or night sweats;  HEENT: Negative; No changes in vision or hearing, sinus congestion, difficulty swallowing Pulmonary: Negative; No cough, wheezing, shortness of breath, hemoptysis Cardiovascular: Negative; No chest pain, presyncope, syncope, palpitations GI: Negative; No nausea, vomiting, diarrhea, or abdominal pain GU: Negative; No dysuria, hematuria, or difficulty voiding Musculoskeletal: Negative; no myalgias, joint pain, or weakness Hematologic/Oncology: Negative; no easy bruising, bleeding Endocrine: Negative; no heat/cold intolerance; no diabetes Neuro: Negative; no changes in balance, headaches Skin: Negative; No rashes or skin lesions Psychiatric: Negative; No behavioral problems, depression Sleep: Positive for OSA on CPAP therapy;  no bruxism, restless legs, hypnogognic hallucinations, no cataplexy Other comprehensive 14 point system review is negative.   PHYSICAL EXAM:   VS:  BP 122/68 (BP Location: Left Arm, Patient Position: Sitting, Cuff Size: Normal)   Pulse 70    Temp (!) 96.8 F (36 C)   Ht '5\' 9"'  (1.753 m)   Wt (!) 313 lb (142 kg)   BMI 46.22 kg/m     Repeat blood pressure by me today was 122/70  Wt Readings from Last 3 Encounters:  02/27/20 (!) 313 lb (142 kg)  01/28/20 (!) 319 lb (144.7 kg)  11/21/19 (!) 317 lb 3.2  oz (143.9 kg)    General: Alert, oriented, no distress.  Skin: normal turgor, no rashes, warm and dry HEENT: Normocephalic, atraumatic. Pupils equal round and reactive to light; sclera anicteric; extraocular muscles intact;  Nose without nasal septal hypertrophy Mouth/Parynx benign; Mallinpatti scale 4 Neck: No JVD, no carotid bruits; normal carotid upstroke Lungs: clear to ausculatation and percussion; no wheezing or rales Chest wall: without tenderness to palpitation Heart: PMI not displaced, RRR, s1 s2 normal, 1/6 systolic murmur, no diastolic murmur, no rubs, gallops, thrills, or heaves Abdomen: soft, nontender; no hepatosplenomehaly, BS+; abdominal aorta nontender and not dilated by palpation. Back: no CVA tenderness Pulses 2+ Musculoskeletal: full range of motion, normal strength, no joint deformities Extremities: no clubbing cyanosis or edema, Homan's sign negative  Neurologic: grossly nonfocal; Cranial nerves grossly wnl Psychologic: Normal mood and affect   Studies/Labs Reviewed:   EKG:  EKG is ordered today.  ECG (independently read by me): Normal sinus rhythm at 70 bpm, first-degree AV block with a PR interval 216 ms.  Possible left atrial enlargement.  No ST segment changes.  December 2020 ECG (independently read by me): Normal sinus rhythm at 85 bpm.  Borderline first-degree AV block with  PR interval 202 ms.  Incomplete left bundle branch block.  No ectopy.  Recent Labs: BMP Latest Ref Rng & Units 08/23/2019 08/22/2019 08/22/2019  Glucose 70 - 99 mg/dL 207(H) - -  BUN 6 - 20 mg/dL 35(H) - -  Creatinine 0.61 - 1.24 mg/dL 2.78(H) - -  BUN/Creat Ratio 10 - 24 - - -  Sodium 135 - 145 mmol/L 136 143 143   Potassium 3.5 - 5.1 mmol/L 3.9 3.3(L) 3.3(L)  Chloride 98 - 111 mmol/L 106 - -  CO2 22 - 32 mmol/L 20(L) - -  Calcium 8.9 - 10.3 mg/dL 9.0 - -     Hepatic Function Latest Ref Rng & Units 08/17/2019 08/17/2019 04/06/2019  Total Protein 6.5 - 8.1 g/dL - 7.3 7.4  Albumin 3.5 - 5.0 g/dL - 3.3(L) 3.7  AST 15 - 41 U/L - 21 21  ALT 0 - 44 U/L 17 QUANTITY NOT SUFFICIENT, UNABLE TO PERFORM TEST 16  Alk Phosphatase 38 - 126 U/L - 112 107  Total Bilirubin 0.3 - 1.2 mg/dL 0.5 QUANTITY NOT SUFFICIENT, UNABLE TO PERFORM TEST 0.5  Bilirubin, Direct 0.1 - 0.5 mg/dL - - -    CBC Latest Ref Rng & Units 08/23/2019 08/22/2019 08/22/2019  WBC 4.0 - 10.5 K/uL 7.2 - -  Hemoglobin 13.0 - 17.0 g/dL 12.5(L) 12.6(L) 12.6(L)  Hematocrit 39.0 - 52.0 % 39.2 37.0(L) 37.0(L)  Platelets 150 - 400 K/uL 167 - -   Lab Results  Component Value Date   MCV 86.3 08/23/2019   MCV 86.3 08/22/2019   MCV 86.0 08/21/2019   Lab Results  Component Value Date   TSH 1.675 08/16/2019   Lab Results  Component Value Date   HGBA1C 13.8 (A) 01/28/2020     BNP    Component Value Date/Time   BNP 81.1 08/16/2019 2133    ProBNP No results found for: PROBNP   Lipid Panel     Component Value Date/Time   CHOL 168 08/17/2019 0402   TRIG 60 08/17/2019 0402   HDL 85 08/17/2019 0402   CHOLHDL 2.0 08/17/2019 0402   VLDL 12 08/17/2019 0402   LDLCALC 71 08/17/2019 0402     RADIOLOGY: No results found.   Additional studies/ records that were reviewed today include:   CARDIAC CATH  04/09/2019  Previously placed Mid LAD drug eluting stent is widely patent.  Ost 1st Diag to 1st Diag lesion is 40% stenosed.  Dist LAD lesion is 10% stenosed.  Previously placed 2nd Mrg drug eluting stent is widely patent.  3rd Mrg lesion is 30% stenosed.  Mid Cx lesion is 50% stenosed.  RPDA lesion is 30% stenosed.  Prox RCA to Mid RCA lesion is 40% stenosed.  Mid RCA lesion is 25% stenosed.  LV end diastolic pressure is  normal.  There is no aortic valve stenosis.  Ost LAD to Prox LAD lesion is 90% stenosed. In stent restenosis.  Scoring balloon angioplasty was performed using a BALLOON WOLVERINE 3.75X10, followed by 4.0 [place] balloon.  Prox LAD lesion is 90% stenosed.  Post intervention, there is a 0% residual stenosis.  Continue IV fluids post procedure. He will need aggressive secondary prevention.   I stressed the importance of taking Brilinta 2x/day. He admits to occasionally missing the second dose due to cost. He states Plavix was stopped in the past, but he is not sure of the specifics.             ASSESSMENT:    1. Coronary artery disease involving native coronary artery of native heart with angina pectoris [facility]): angina free on his anti-ischemic medical regimen   2. CAD S/P percutaneous coronary angioplasty   3. OSA on CPAP   4. CKD (chronic kidney disease), stage IV ([place])   5. Essential hypertension   6. Hyperlipidemia LDL goal <70   7. DM type 2 with diabetic peripheral neuropathy ([place])   8. Morbid obesity ([place])     PLAN:  1.  CAD: Status post multiple PCI's with initial symptomatology in the setting of acute coronary syndrome November 2012.  In September 2020 he underwent his most recent intervention.  At present he has had no recurrent ischemic symptoms.  He denies any significant exertional shortness of breath but admits that he is not active.  I recommended lifelong DAPT therapy which she has tolerated.  2.  Essential hypertension: Blood pressure today is controlled on his multiple drug regimen of amlodipine 10 mg, furosemide 80 mg, metoprolol succinate 150 mg daily in addition to his isosorbide which he currently takes 120 mg twice a day.    3.  Obstructive sleep apnea: Past month, [name] essentially stopped using his CPAP due to complaints with his mask not fitting properly.  Download from 2 months previously continued to show elevated AHI at 11.7.  Ever he  had significant mask leak.  1 day he used his equipment over the past 30 days his AHI was 3.1 with 8 hours and 31 minutes of use.  I have recommended changing his mask to a ResMed air fit F 30i which I think will work better than his current one.  We will obtain a new download in the next 1 to 2 months.  4.  Hyperlipidemia: He continues to be on Zetia 10 mg in addition to rosuvastatin 40 mg with target LDL less than 70.  I have recommended further aggressiveness LDL target in the 50s or below.   5. .Stage 4 chronic kidney disease: Patient is followed by Dr. Madelon Lips.  Creatinine on December 20, 2019 was 2.6 with a BUN of 36.  On days he has significant swelling he was told to double his furosemide from 80 mg to 160 mg for 2 days per Dr. Allyson Sabal.   6.  Diabetes mellitus with complication: History of  poorly controlled diabetes with prior history of DKA.  Currently on insulin.  He is now seeing Dr. Beverly Sessions for endocrinology and had seen her once with plans for follow-up on March 12, 2020  7.  Right transmetatarsal amputation: This has resulted in some imbalance to his gait with walking.  He had received a prosthesis for shoe.  8.  Morbid obesity: BMI 46.22.  Again discussed weight loss and increased activity if at all possible.  I again discussed optimal heart healthy diet being a Mediterranean diet as well a plant-based diet.    I will see him in 6 months for cardiology reevaluation.   Medication Adjustments/Labs and Tests Ordered: Current medicines are reviewed at length with the patient today.  Concerns regarding medicines are outlined above.  Medication changes, Labs and Tests ordered today are listed in the Patient Instructions below. Patient Instructions  Medication Instructions:  CONTINUE WITH CURRENT MEDICATIONS. NO CHANGES.  *If you need a refill on your cardiac medications before your next appointment, please call your pharmacy*  Follow-Up: At [facility], you and your  health needs are our priority.  As part of our continuing mission to provide you with exceptional heart care, we have created designated Provider Care Teams.  These Care Teams include your primary Cardiologist (physician) and Advanced Practice Providers (APPs -  Physician Assistants and Nurse Practitioners) who all work together to provide you with the care you need, when you need it.  We recommend signing up for the patient portal called "MyChart".  Sign up information is provided on this After Visit Summary.  MyChart is used to connect with patients for Virtual Visits (Telemedicine).  Patients are able to view lab/test results, encounter notes, upcoming appointments, etc.  Non-urgent messages can be sent to your provider as well.   To learn more about what you can do with MyChart, go to [email or web address].    Your next appointment:   6 month(s)  The format for your next appointment:   In Person  Provider:   [name], MD   Other Instructions Switching mask to [place]-- our sleep coordinator will contact you.     Signed, Joshua Majestic, MD  02/29/2020 8:00 AM    [facility] [address] [place], [place], [place]  [address] Phone: [phone]

## 2020-02-27 NOTE — Patient Instructions (Signed)
Medication Instructions:  CONTINUE WITH CURRENT MEDICATIONS. NO CHANGES.  *If you need a refill on your cardiac medications before your next appointment, please call your pharmacy*  Follow-Up: At [facility], you and your health needs are our priority.  As part of our continuing mission to provide you with exceptional heart care, we have created designated Provider Care Teams.  These Care Teams include your primary Cardiologist (physician) and Advanced Practice Providers (APPs -  Physician Assistants and Nurse Practitioners) who all work together to provide you with the care you need, when you need it.  We recommend signing up for the patient portal called "MyChart".  Sign up information is provided on this After Visit Summary.  MyChart is used to connect with patients for Virtual Visits (Telemedicine).  Patients are able to view lab/test results, encounter notes, upcoming appointments, etc.  Non-urgent messages can be sent to your provider as well.   To learn more about what you can do with MyChart, go to [email or web address].    Your next appointment:   6 month(s)  The format for your next appointment:   In Person  Provider:   [name], MD   Other Instructions Switching mask to [place]-- our sleep coordinator will contact you.

## 2020-02-28 ENCOUNTER — Telehealth: Payer: Self-pay | Admitting: *Deleted

## 2020-02-28 ENCOUNTER — Other Ambulatory Visit: Payer: Self-pay | Admitting: Cardiovascular Disease

## 2020-02-28 DIAGNOSIS — G4733 Obstructive sleep apnea (adult) (pediatric): Secondary | ICD-10-CM

## 2020-02-28 NOTE — Telephone Encounter (Signed)
Order for ResMed F30i mask  Placed via Epic. Charmian Muff with [facility] notified via [employer] of order.

## 2020-02-28 NOTE — Telephone Encounter (Signed)
-----   Message from Darlyn Chamber June, RN sent at 02/27/2020 11:02 AM EDT ----- Pt needs a new resmed airfit F30i mask! Thanks !

## 2020-02-29 ENCOUNTER — Encounter: Payer: Self-pay | Admitting: Cardiovascular Disease

## 2020-03-06 ENCOUNTER — Encounter: Payer: Medicaid Other | Attending: Internal Medicine | Admitting: Dietician

## 2020-03-06 ENCOUNTER — Encounter: Payer: Self-pay | Admitting: Dietician

## 2020-03-06 ENCOUNTER — Other Ambulatory Visit: Payer: Self-pay

## 2020-03-06 DIAGNOSIS — E1142 Type 2 diabetes mellitus with diabetic polyneuropathy: Secondary | ICD-10-CM | POA: Insufficient documentation

## 2020-03-06 NOTE — Patient Instructions (Addendum)
Consider having your vitamin D checked again. Take a vitamin B-12 supplement (one that dissolves in your mouth) daily if you are not eating any animal products.  Aim to eat more consistently.  Small meals are fine. Consider options for meals that do not contain meat.  Vegetable plate including beans  Bean soup or vegetable soup Try to include greens every day at least once.   Rotate your insulin injection sites Check your blood sugar for 2 weeks 4 times per day to meet criteria for a CGM.

## 2020-03-06 NOTE — Progress Notes (Signed)
Medical Nutrition Therapy:  Appt start time: 0388 end time:  82800.   Assessment:  Primary concerns today:  Patient is here today alone.  He states that he is working towards a plant based eating plan since February.  He states that he does not know what to eat anymore and does not have a taste for food much anyway.  He states that his food budget is limited as well.   He was last seen by myself 04/2019.  Patient of [name] who was referred for poorly controled type 2 diabetes with circulatory disorder.  He was last seen by myself 04/19/2019 via the phone due to the [place] pandemic.  Last A1C 13.8% increased from 10.2% 08/2019 and decreased from 15% 04/07/2019.  Noted GFR estimated at 27 08/2019  Vitamin D was 15 10/2013 He has been having increased muscle cramps throughout his body.  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 He states he is not currently wearing a mask and is not getting into a good restful sleep.  He reports increased depression related to his wife's declining health, financial issues, and his own health.  His son is living with them and paying the mortgage.  His wife has mobility issues.  He does most of the shopping and shares cooking with his son but eats take out very frequently.  He states that shopping causes him increased physical pain. He finds it difficult to meet his own nutritional needs as well as the preferences of his family simply and within the needs of his family. He is currently on disability and previous jobs were very physically  active. Preferred Learning Style:   No preference indicated   Learning Readiness:   Ready   DIETARY INTAKE: Meal time is inconsistent 24-hr recall:  B ( AM): skips often  Snk ( AM):   L (3-4PM): leftovers or spoon of peanut butter OR today:  2 bites wopper, 6 fries, diet soda Snk ( PM):  D ( PM): chicken and rice OR rare McDonald's chickn sandwich OR BBQ sampler and ate 1 sausage, 1 bone ribs, 1 oz pulled pork, 1 pinch of cornbread Snk ( PM):  Beverages: water, diet soda, rare regular soda with ETOH and will binge on alcohol at times and reports none recently.  Would drink approximately 8 servings per day in the past.  Usual physical activity: limited   Progress Towards Goal(s):  In progress.   Nutritional Diagnosis:  NB-1.1 Food and nutrition-related knowledge deficit As related to balance of carbohydrates protein, and fat.  As evidenced by diet hx and patient report.    Intervention:  Nutrition education related to balanced diet.  Discussed sodium in foods eaten out.  Discussed concerns of kidney issues.  Gave general recommendations for plant based eating and discussed basic meal planning. He was not rotating insulin injection sites so discussed this.  He is not checking his blood sugar as he states that he has a hard time getting blood.  Discussed that he needs to check his blood sugar 4 times daily for  2 weeks to meet criteria for CGM.  Provided a book to record his blood sugars and recommended that he bring this and his meter with him to his next appointment with [name].  Plan: Consider having your vitamin D checked again. Take a vitamin B-12 supplement (one that dissolves in your mouth) daily if you are not eating any animal products.  Aim to eat more consistently.  Small meals are fine. Consider options for meals that do not contain meat.  Vegetable plate including beans  Bean soup or vegetable soup Try to include greens every day at least once.    Rotate your  insulin injection sites Check your blood sugar for 2 weeks 4 times per day to meet criteria for a CGM.   Teaching Method Utilized:  Visual Auditory Hands on   Barriers to learning/adherence to lifestyle change: finances, health, motivation  Demonstrated degree of understanding via:  Teach Back   Monitoring/Evaluation:  Dietary intake, exercise, and body weight in 1 month(s).

## 2020-03-07 ENCOUNTER — Ambulatory Visit: Payer: Medicaid Other | Attending: Internal Medicine

## 2020-03-07 DIAGNOSIS — Z23 Encounter for immunization: Secondary | ICD-10-CM

## 2020-03-07 NOTE — Progress Notes (Signed)
   Covid-19 Vaccination Clinic  Name:  Joshua [name]    MRN: [id number] DOB: August 21, 1956  03/07/2020  Joshua [name] was observed post Covid-19 immunization for 15 minutes without incident. He was provided with Vaccine Information Sheet and instruction to access the V-Safe system.   Joshua [name] was instructed to call 911 with any severe reactions post vaccine: [name] Difficulty breathing  . Swelling of face and throat  . A fast heartbeat  . A bad rash all over body  . Dizziness and weakness   Immunizations Administered    Name Date Dose VIS Date Route   Pfizer COVID-19 Vaccine 03/07/2020  2:25 PM 0.3 mL 11/23/2019 Intramuscular   Manufacturer: [place]   Lot: [id number]   [place]: [id number]

## 2020-03-12 ENCOUNTER — Other Ambulatory Visit: Payer: Self-pay

## 2020-03-12 ENCOUNTER — Encounter: Payer: Self-pay | Admitting: Internal Medicine

## 2020-03-12 ENCOUNTER — Ambulatory Visit: Payer: Medicaid Other | Admitting: Internal Medicine

## 2020-03-12 VITALS — BP 136/70 | HR 92 | Ht 69.0 in | Wt 295.0 lb

## 2020-03-12 DIAGNOSIS — E1165 Type 2 diabetes mellitus with hyperglycemia: Secondary | ICD-10-CM | POA: Diagnosis not present

## 2020-03-12 DIAGNOSIS — E669 Obesity, unspecified: Secondary | ICD-10-CM

## 2020-03-12 DIAGNOSIS — E785 Hyperlipidemia, unspecified: Secondary | ICD-10-CM | POA: Diagnosis not present

## 2020-03-12 DIAGNOSIS — E1159 Type 2 diabetes mellitus with other circulatory complications: Secondary | ICD-10-CM

## 2020-03-12 MED ORDER — INSULIN ASPART 100 UNIT/ML ~~LOC~~ SOLN: 10.0000 [IU] | Freq: Two times a day (BID) | SUBCUTANEOUS | 3 refills | Status: DC

## 2020-03-12 MED ORDER — INSULIN GLARGINE 100 UNIT/ML ~~LOC~~ SOLN: 30.0000 [IU] | Freq: Two times a day (BID) | SUBCUTANEOUS | 3 refills | Status: DC

## 2020-03-12 MED ORDER — VICTOZA 18 MG/3ML ~~LOC~~ SOPN: 1.8000 mg | PEN_INJECTOR | Freq: Every day | SUBCUTANEOUS | 3 refills | Status: DC

## 2020-03-12 NOTE — Progress Notes (Signed)
Patient ID: Joshua [name], male   DOB: Apr 30, 1956, 64 y.o.   MRN: [id number]   This visit occurred during the SARS-CoV-2 public health emergency.  Safety protocols were in place, including screening questions prior to the visit, additional usage of staff PPE, and extensive cleaning of exam room while observing appropriate contact time as indicated for disinfecting solutions.   HPI: Joshua [name] is a 64 y.o.-year-old male, initially referred by his PCP, Dr. Alyson Ingles, returning for follow-up for DM2, dx in 1990s, insulin-dependent, uncontrolled, with complications (CAD - s/p AMI 2012, s/p PCIs, ischemic cardiomyopathy, CHF, CKD stage 4, PN - s/p R transmetatarsal amputation, gastroparesis, history of DKA). He saw Dr Dwyane Dee initially, then Dr. Chalmers Cater for 20 years, but she is not accepting Medicaid patients anymore so he had to switch providers.  Last visit with me 1.5 months ago.  At last visit I recommended that he started Ozempic but he could not obtain this as this was not covered by his insurance.    Reviewed latest HbA1c levels: Lab Results  Component Value Date   HGBA1C 13.8 (A) 01/28/2020   HGBA1C 10.2 (H) 08/16/2019   HGBA1C 15.0 (H) 04/07/2019   HGBA1C >15.5 (H) 03/15/2018   HGBA1C 11.8 (H) 05/19/2017   HGBA1C 8.5 (H) 11/17/2016   HGBA1C 9.5 (H) 07/18/2016   HGBA1C 10.8 (H) 02/16/2016   HGBA1C 14.2 (H) 10/13/2015   HGBA1C 10.0 (H) 07/03/2014   At last visit, he was on: - Lantus 20 units 2x a day - NovoLog 10 units 3x a day, before meals Prev. On 70/30 until Spring 2020.  We changed to: - Lantus 25 units 2x a day - NovoLog 10-12 units before meals (15 min before you eat) He could not start Ozempic 0.5 mg as suggested 01/2020.  At last visit, she was checking sugars seldom  as the skin of his fingertips = hard; since last visit, he checks 0-2 times a day (6 checks in the last 2 weeks): - am: 50, ave 130 >> 163, 290, 329 in last 2 weeks - 2h after b'fast: n/c - before lunch:  n/c >> 266 - 2h after lunch: n/c - before dinner: n/c - 2h after dinner: n/c - bedtime: n/c >> 285 - nighttime: n/c >> HI Lowest sugar was 50 >> 163; he has hypoglycemia awareness at 100.  Highest sugar was 400 >> HI.  Glucometer: ReliOn  Pt's meals are: - Breakfast: skips - Lunch: may skip, PB or chicken - Dinner: chicken w/o sides or with + veggies + starch;  - Snacks: occasional sweets: Hershey bar, sweet tea, cool aid He drinks diet sodas and water.  -+ CKD stage IV, last BUN/creatinine:  Lab Results  Component Value Date   BUN 35 (H) 08/23/2019   BUN 38 (H) 08/22/2019   CREATININE 2.78 (H) 08/23/2019   CREATININE 3.01 (H) 08/22/2019   Lab Results  Component Value Date   GFRAA 27 (L) 08/23/2019   GFRAA 25 (L) 08/22/2019   GFRAA 24 (L) 08/21/2019   GFRAA 22 (L) 08/20/2019   GFRAA 29 (L) 08/19/2019   He is not on ACE inhibitor/ARB.  -+ HL; last set of lipids: Lab Results  Component Value Date   CHOL 168 08/17/2019   HDL 85 08/17/2019   LDLCALC 71 08/17/2019   TRIG 60 08/17/2019   CHOLHDL 2.0 08/17/2019  On Crestor 40, Zetia 10.  - last eye exam was in 11/2019: No DR. Had cataract sx in 2014, 2015.  -He has  numbness and tingling in his feet.  On ASA 81.  Pt has FH of DM in GF.  He also has a history of HTN, OSA, GERD, esophageal ulcers.  He denies h/o pancreatitis or FH of MTC.  ROS: Constitutional: no weight gain/+ weight loss gradually), + fatigue, no subjective hyperthermia, no subjective hypothermia Eyes: no blurry vision, no xerophthalmia ENT: no sore throat, no nodules palpated in neck, no dysphagia, no odynophagia, no hoarseness Cardiovascular: no CP/+ SOB/no palpitations/ leg swelling Respiratory: no cough/+ SOB/no wheezing Gastrointestinal: no N/no V/no D/no C/+ acid reflux Musculoskeletal: no muscle aches/+ joint aches Skin: no rashes, no hair loss Neurological: no tremors/no numbness/no tingling/no dizziness  I reviewed pt's  medications, allergies, PMH, social hx, family hx, and changes were documented in the history of present illness. Otherwise, unchanged from my initial visit note.  Past Medical History:  Diagnosis Date  . Anemia   . Chronic combined systolic and diastolic CHF (congestive heart failure) ([place])    a. 07/2015 Echo: EF 45-50%, mod LVH, mid apical/antsept AK, Gr 1 DD.  [name] CKD (chronic kidney disease), stage III   . Coronary artery disease    a. 2012 s/p MI/PCI: s/p DES to mid/dist RCA and overlapping DES to LAD;  b. 07/2015 Cath: LAD 10% ISR, D1 40(jailed), LCX 52m OM2 30, OM3 30, RCA 580mSR, 10d ISR; c. 07/2016 NSTEMI/PCI: LAD 85ost/p/m (3.5x20 Synergy DES overlapping prior stent), D1 40, LCX 7538mM2 95 (2.75x16 Synergy DES), OM3 30, RCA 30m54md.  . Depression   . Diabetic gastroparesis (HCC)[place]. DKA (diabetic ketoacidoses) (HCC)[place]/01/2018  . GERD (gastroesophageal reflux disease)   . Gout   . Heart murmur   . Hyperlipemia   . Hypertensive heart disease   . Ischemic cardiomyopathy    a. 07/2015 Echo: EF 45-50%, mod LVH, mid-apicalanteroseptal AK, Gr1 DD.  . Morbid obesity (HCC)[place]. OSA on CPAP   . Osteomyelitis (HCC)[place] a. 07/2016 MTP joint of R great toe s/p transmetatarsal amputation.  . Polyneuropathy in diabetes(357.2)   . Pulmonary sarcoidosis (HCC)[place] "problems w/it years ago; no problems anymore" (07/03/2014)  . Rectal bleeding   . Sleep apnea   . Type II diabetes mellitus (HCC)[place] Past Surgical History:  Procedure Laterality Date  . AMPUTATION Right 07/24/2016   Procedure: TRANSMETATARSAL FOOT AMPUTATION;  Surgeon: MarcNewt Minion;  [facility];  Laterality: Right;  . BALLOON DILATION N/A 03/21/2018   Procedure: BALLOON DILATION;  Surgeon: BuccRonald Lobo;  [facility];  Service: Endoscopy;  Laterality: N/A;  . BREAST LUMPECTOMY Right ~ 2000   "benign"  . CARDIAC CATHETERIZATION N/A 07/30/2015   Procedure: Left Heart Cath and Coronary  Angiography;  Surgeon: ChriBurnell Blanks;  [facility];  Service: Cardiovascular;  Laterality: N/A;  . CARDIAC CATHETERIZATION N/A 07/19/2016   Procedure: Left Heart Cath and Coronary Angiography;  Surgeon: HenrBelva Crome;  [facility];  Service: Cardiovascular;  Laterality: N/A;  . CARDIAC CATHETERIZATION N/A 07/29/2016   Procedure: Coronary Stent Intervention;  Surgeon: HenrBelva Crome;  [facility];  Service: Cardiovascular;  Laterality: N/A;  . CARDIAC CATHETERIZATION N/A 07/29/2016   Procedure: Intravascular Ultrasound/IVUS;  Surgeon: HenrBelva Crome;  [facility];  Service: Cardiovascular;  Laterality: N/A;  . CATARACT EXTRACTION W/ INTRAOCULAR LENS  IMPLANT, BILATERAL Bilateral 2014-2015  .  COLONOSCOPY WITH PROPOFOL N/A 08/22/2017   Procedure: COLONOSCOPY WITH PROPOFOL;  Surgeon: [name], MD;  Location: [place];  Service: Endoscopy;  Laterality: N/A;  . CORONARY ANGIOPLASTY WITH STENT PLACEMENT  10/31/2011   30 % MID ATRIOVENTRICULAR GROOVE CX STENOSIS. 90 % MID RCA.PTA TO LAD/STENTING OF TANDEM 60-70%. LAD STENOSIS 99% REDUCED TO 0%.3.0 X 32MM PROMUS DES POSTDILATED TO 3.25MM.  [name] CORONARY ANGIOPLASTY WITH STENT PLACEMENT  07/03/2014   "2"  . CORONARY STENT INTERVENTION N/A 04/09/2019   Procedure: CORONARY STENT INTERVENTION;  Surgeon: Jettie Booze, MD;  Location: [place] CV LAB;  Service: Cardiovascular;  Laterality: N/A;  . CORONARY STENT INTERVENTION N/A 08/22/2019   Procedure: CORONARY STENT INTERVENTION;  Surgeon: Martinique, Peter M, MD;  Location: [place] CV LAB;  Service: Cardiovascular;  Laterality: N/A;  . ESOPHAGOGASTRODUODENOSCOPY (EGD) WITH PROPOFOL N/A 03/21/2018   Procedure: ESOPHAGOGASTRODUODENOSCOPY (EGD) WITH PROPOFOL;  Surgeon: [name], MD;  Location: [place];  Service: Endoscopy;  Laterality: N/A;  . I & D EXTREMITY Right 10/30/2013   Procedure: IRRIGATION  AND DEBRIDEMENT RIGHT SMALL FINGER POSSIBLE SECONDARY WOUND CLOSURE;  Surgeon: [name], MD;  Location: [facility];  Service: Orthopedics;  Laterality: Right;  [name] is available after 4pm  . I & D EXTREMITY Right 11/01/2013   Procedure:  IRRIGATION AND DEBRIDEMENT RIGHT  PROXIMAL PHALANGEAL LEVEL AMPUTATION;  Surgeon: [name], MD;  Location: [facility];  Service: Orthopedics;  Laterality: Right;  . INCISION AND DRAINAGE Right 10/28/2013   Procedure: INCISION AND DRAINAGE Right small finger;  Surgeon: [name], MD;  [facility];  Service: Orthopedics;  Laterality: Right;  . LEFT HEART CATH Left 11/03/2011   Procedure: LEFT HEART CATH;  Surgeon: [name], MD;  [facility]: [facility] CATH LAB;  Service: Cardiovascular;  Laterality: Left;  . LEFT HEART CATH AND CORONARY ANGIOGRAPHY N/A 04/09/2019   Procedure: LEFT HEART CATH AND CORONARY ANGIOGRAPHY;  Surgeon: Jettie Booze, MD;  Location: [place] CV LAB;  Service: Cardiovascular;  Laterality: N/A;  . LEFT HEART CATHETERIZATION WITH CORONARY ANGIOGRAM N/A 07/03/2014   Procedure: LEFT HEART CATHETERIZATION WITH CORONARY ANGIOGRAM;  Surgeon: [name], MD;  Location: [facility] CATH LAB;  Service: Cardiovascular;  Laterality: N/A;  . PERCUTANEOUS CORONARY STENT INTERVENTION (PCI-S) Left 11/03/2011   Procedure: PERCUTANEOUS CORONARY STENT INTERVENTION (PCI-S);  Surgeon: [name], MD;  [name] C[facility];  Service: Cardiovascular;  Laterality: Left;  . RIGHT/LEFT HEART CATH AND CORONARY ANGIOGRAPHY N/A 08/22/2019   Procedure: RIGHT/LEFT HEART CATH AND CORONARY ANGIOGRAPHY;  Surgeon: Martinique, Peter M, MD;  Location: [place] CV LAB;  Service: Cardiovascular;  Laterality: N/A;  . TOE AMPUTATION Right ~ 2010   "2nd toe"  . TOE AMPUTATION Right 2012   "3rd toe"   Social History   Socioeconomic History  . Marital status: Married    Spouse name: Not on file  . Number of children: Not on file  . Years of  education: Not on file  . Highest education level: Not on file  Occupational History  . Not on file  Tobacco Use  . Smoking status: Never Smoker  . Smokeless tobacco: Never Used  Substance and Sexual Activity  . Alcohol use: Yes    Comment: social   . Drug use: No  . Sexual activity: Yes  Other Topics Concern  . Not on file  Social History Narrative  . Not on file   Social Determinants of Health  Financial Resource Strain:   . Difficulty of Paying Living Expenses:   Food Insecurity:   . Worried About Charity fundraiser in the Last Year:   . Arboriculturist in the Last Year:   Transportation Needs:   . Film/video editor (Medical):   [name] Lack of Transportation (Non-Medical):   Physical Activity:   . Days of Exercise per Week:   . Minutes of Exercise per Session:   Stress:   . Feeling of Stress :   Social Connections:   . Frequency of Communication with Friends and Family:   . Frequency of Social Gatherings with Friends and Family:   . Attends Religious Services:   . Active Member of Clubs or Organizations:   . Attends Archivist Meetings:   [name] Marital Status:   Intimate Partner Violence:   . Fear of Current or Ex-Partner:   . Emotionally Abused:   [name] Physically Abused:   . Sexually Abused:    Current Outpatient Medications on File Prior to Visit  Medication Sig Dispense Refill  . Accu-Chek FastClix Lancets MISC USE UP TO FOUR TIMES DAILY AS DIRECTED    . ACCU-CHEK GUIDE test strip USE UP TO FOUR TIMES DAILY AS DIRECTED    . acetaminophen (TYLENOL) 650 MG CR tablet Take 1,300 mg by mouth daily as needed for pain.    [name] allopurinol (ZYLOPRIM) 300 MG tablet Take 300 mg by mouth daily.     [name] amLODipine (NORVASC) 10 MG tablet Take 1 tablet (10 mg total) by mouth daily.    [name] aspirin EC 81 MG tablet Take 81 mg by mouth daily.     . blood glucose meter kit and supplies Dispense based on patient and insurance preference. Use up to four times daily as directed.  (FOR ICD-10 E10.9, E11.9). 1 each 0  . BRILINTA 90 MG TABS tablet Take 1 tablet by mouth twice daily 180 tablet 1  . ezetimibe (ZETIA) 10 MG tablet Take 1 tablet (10 mg total) by mouth daily. 90 tablet 3  . furosemide (LASIX) 80 MG tablet Take 80 mg by mouth.    . hydrALAZINE (APRESOLINE) 100 MG tablet Take 100 mg by mouth 3 (three) times daily.    . insulin aspart (NOVOLOG) 100 UNIT/ML injection Inject 6 Units into the skin 3 (three) times daily with meals. (Patient taking differently: Inject 10-12 Units into the skin 2 (two) times daily. Sliding scale) 10 mL 11  . insulin glargine (LANTUS) 100 UNIT/ML injection Inject 0.2 mLs (20 Units total) into the skin 2 (two) times daily. 10 mL 11  . isosorbide mononitrate (IMDUR) 120 MG 24 hr tablet Take 1 tablet (120 mg total) by mouth 2 (two) times daily.    . metoprolol succinate (TOPROL-XL) 50 MG 24 hr tablet Take 3 tablets (150 mg total) by mouth daily. Take with or immediately following a meal. 90 tablet 0  . nitroGLYCERIN (NITROSTAT) 0.4 MG SL tablet Place 1 tablet (0.4 mg total) under the tongue every 5 (five) minutes as needed for chest pain. 25 tablet 1  . pantoprazole (PROTONIX) 40 MG tablet TAKE 1 TABLET BY MOUTH TWICE DAILY BEFORE MEAL(S) 180 tablet 0  . potassium chloride (KLOR-CON) 20 MEQ packet Take by mouth 2 (two) times daily. Unknown amount (2 pills daily)    . rosuvastatin (CRESTOR) 40 MG tablet Take 1 tablet by mouth once daily 30 tablet 0  . Semaglutide,0.25 or 0.5MG/DOS, (OZEMPIC, 0.25 OR 0.5 MG/DOSE,) 2 MG/1.5ML SOPN  Inject 0.25 mg into the skin once a week. 0.25 mg weekly in a.m. (for example on Sunday morning) x 4 weeks, then increase to 0.5 mg weekly in a.m. 1 pen 4   No current facility-administered medications on file prior to visit.   Allergies  Allergen Reactions  . Chlorthalidone Other (See Comments)    Causes dehydration, kidneys shut down   Family History  Problem Relation Age of Onset  . Heart attack Maternal Aunt    . Diabetes Maternal Grandmother   . Hypertension Maternal Grandmother     PE: BP 136/70   Pulse 92   Ht '5\' 9"'  (1.753 m)   Wt 295 lb (133.8 kg)   SpO2 98%   BMI 43.56 kg/m  Wt Readings from Last 3 Encounters:  03/12/20 295 lb (133.8 kg)  03/06/20 300 lb (136.1 kg)  02/27/20 (!) 313 lb (142 kg)   Constitutional: overweight, in NAD Eyes: PERRLA, EOMI, no exophthalmos ENT: moist mucous membranes, no thyromegaly, no cervical lymphadenopathy Cardiovascular: RRR, No MRG, very Respiratory: CTA B Gastrointestinal: abdomen soft, NT, ND, BS+ Musculoskeletal: + Deformities: Right transmetatarsal amputation, strength intact in all 4 Skin: moist, warm, no rashes Neurological: no tremor with outstretched hands, DTR normal in all 4  ASSESSMENT: 1. DM2, insulin-dependent, uncontrolled, with complications - CAD - s/p AMI 2012, s/p multiple PCI's; ischemic cardiomyopathy; CHF. Dr. Claiborne Billings - CKD stage 4 - PN - s/p R transmetatarsal amputation - Gastroparesis - History of DKA  PLAN:  1. Patient with longstanding, uncontrolled, type 2 diabetes, on basal-bolus insulin regimen. At last visit, HbA1c was higher, is 13.8%. -At last visit he was not checking sugars as his fingertip skin was hard and we discussed that for a CGM to be covered, he does need to start checking 4 times a day.  He is already meeting the criteria of taking at least 3 insulin injections a day.  He did not start checking more consistently since last visit. -At last visit we also increased his Lantus dose and continue the same NovoLog doses.  I also advised him to add Ozempic but this was not covered.  We also discussed at that time about improving diet towards a more plant-based one.  He saw the dietitian since last visit and got more nutritional advice about how to switch his diet.  He still drinks diet sodas.  He also tells me that he is eating meals erratically and forgets his NovoLog before meals.  This is definitely an  impediment in his diabetes control.  Indeed, at this visit, sugars are high and fluctuating, with only 1 blood sugar in the 100s, otherwise sugars are between 200s and HI. -We discussed about the absolute need to improve his diet especially by having more consistent meals even if he is not eating a lot with each meal.  He also needs to make an effort to inject NovoLog before every meal.  We will increase the Lantus dose and also the NovoLog but this was not covered by Medicaid.  We will try Victoza.  I advised him to increase the dose slowly.  Discussed about possible side effects but also benefits of Victoza. -I again advised him to check sugars 4 times a day before next visit so we can try to get him a CGM. - I suggested to:  Patient Instructions  Please increase: - Lantus 30 units a day 2x a day - NovoLog 10-15 units before meals (15 min before you eat)  Please try to start: -  Victoza 0.6 mg daily before breakfast. In 5 days, increase to 1.2 mg daily, then in 5 more days, increase to 1.8 mg daily.  Please come back for a follow-up appointment in 1.5-2 months.  - advised to check sugars at different times of the day - 4x a day, rotating check times - advised for yearly eye exams >> he is UTD - return to clinic in 1.5-2 months   2. HL - Reviewed latest lipid panel from 08/2019:  fractions at goal Lab Results  Component Value Date   CHOL 168 08/17/2019   HDL 85 08/17/2019   LDLCALC 71 08/17/2019   TRIG 60 08/17/2019   CHOLHDL 2.0 08/17/2019  - Continues Crestor 40 and Zetia without side effects. - he also saw nutrition since last visit  3.  Obesity class III -he could not start Ozempic which would have also helped with weight loss - not covered -he lost 24 pounds in the last 1.5 months, but this could be fluid related, as he increased Lasix dose 3 weeks ago  Philemon Kingdom, MD PhD [facility] Endocrinology

## 2020-03-12 NOTE — Patient Instructions (Addendum)
Please increase: - Lantus 30 units a day 2x a day - NovoLog 10-15 units before meals (15 min before you eat)  Please try to start: - Victoza 0.6 mg daily before breakfast. In 5 days, increase to 1.2 mg daily, then in 5 more days, increase to 1.8 mg daily.  Please come back for a follow-up appointment in 1.5-2 months.

## 2020-03-21 ENCOUNTER — Telehealth: Payer: Self-pay | Admitting: Internal Medicine

## 2020-03-21 NOTE — Telephone Encounter (Signed)
I have several I am trying to get through and he is on the list.

## 2020-03-21 NOTE — Telephone Encounter (Signed)
Patient called checking in on PA for victoza.

## 2020-03-24 NOTE — Telephone Encounter (Signed)
PA has been submitted, awaiting on response.

## 2020-03-31 ENCOUNTER — Other Ambulatory Visit: Payer: Self-pay | Admitting: Physician Assistant

## 2020-03-31 ENCOUNTER — Telehealth: Payer: Self-pay

## 2020-03-31 MED ORDER — VICTOZA 18 MG/3ML ~~LOC~~ SOPN: 1.8000 mg | PEN_INJECTOR | Freq: Every day | SUBCUTANEOUS | 3 refills | Status: DC

## 2020-03-31 NOTE — Telephone Encounter (Signed)
Prior authorization for Victoza has been approved by patient's insurance.  Coverage is effective 03/25/2020 to 03/20/2021  PA Approval/Reference Number [id number]  Approval letter has been sent to scanning.

## 2020-04-01 ENCOUNTER — Ambulatory Visit: Payer: Medicaid Other | Attending: Internal Medicine

## 2020-04-01 DIAGNOSIS — Z23 Encounter for immunization: Secondary | ICD-10-CM

## 2020-04-01 NOTE — Progress Notes (Signed)
   Covid-19 Vaccination Clinic  Name:  Joshua [name]    MRN: [id number] DOB: Dec 05, 1956  04/01/2020  [name] was observed post Covid-19 immunization for 15 minutes without incident. He was provided with Vaccine Information Sheet and instruction to access the V-Safe system.   [name] was instructed to call 911 with any severe reactions post vaccine: [name] Difficulty breathing  . Swelling of face and throat  . A fast heartbeat  . A bad rash all over body  . Dizziness and weakness   Immunizations Administered    Name Date Dose VIS Date Route   Pfizer COVID-19 Vaccine 04/01/2020  2:52 PM 0.3 mL 02/06/2019 Intramuscular   Manufacturer: [place]   Lot: [id number]   [place]: [id number]

## 2020-04-09 ENCOUNTER — Other Ambulatory Visit: Payer: Self-pay

## 2020-04-09 MED ORDER — INSULIN GLARGINE 100 UNIT/ML ~~LOC~~ SOLN: 30.0000 [IU] | Freq: Two times a day (BID) | SUBCUTANEOUS | 3 refills | Status: DC

## 2020-04-10 ENCOUNTER — Encounter: Payer: Medicaid Other | Attending: Internal Medicine | Admitting: Dietician

## 2020-04-10 ENCOUNTER — Other Ambulatory Visit: Payer: Self-pay

## 2020-04-10 ENCOUNTER — Encounter: Payer: Self-pay | Admitting: Dietician

## 2020-04-10 DIAGNOSIS — E1142 Type 2 diabetes mellitus with diabetic polyneuropathy: Secondary | ICD-10-CM

## 2020-04-10 NOTE — Progress Notes (Signed)
Medical Nutrition Therapy:  Appt start time: 2919 end time:  1660.   Assessment:  Primary concerns today: Patient is here today alone for poorly controlled type 2 diabetes.  Patient of [name]. He was last seen by myself 03/06/20. He states that he struggles with behavior change.  States that he is all in for a time which results in imbalance in other things.  Initial push towards the goal then decreases.  Last appointment, he stated that he was working towards a plant based eating plan but currently states that he has eaten less vegetables in the past month than prior partly due to poor appetite.    History includes:  Type 2 Diabetes, CHF, CKD4, gastroparesis.  Medications: Lantus 30 units bid (Not taking as he ran out 1 1/2 weeks ago.  The prescription is now ready to pick up.) Novolog- not taking as he has not been eating much Blood sugar 155-255 when checked in the last 24 hours.  He did not bring his meter. Victoza- mild nausea at first, very full feeling and decreased appetite currently. He hesitates to take increased lasix as recommended by MD's due to increased muscle cramps  States that he is struggling a lot to walk.  States increased shin pain and pain on the bottoms of his feet with walk. He has had increased fluid gain and his leg is obviously swollen.  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 He states he is not currently wearing a mask and is not getting into a good restful sleep.  He reports increased depression related to his wife's declining health, financial issues, and his own health.  His son is living with them and paying the mortgage.  His wife has mobility issues.  He  does most of the shopping and shares cooking with his son but eats take out very frequently.  He states that shopping causes him increased physical pain. He finds it difficult to meet his own nutritional needs as well as the preferences of his family simply and within the needs of his family. He is currently on disability and previous jobs were very physically active. Middle son type 2 diagnosed with diabetes 3 weeks ago.  States that his oldest son uses alcohol and they encourage each other in this habit.  Preferred Learning Style:   No preference indicated   Learning Readiness:   Change in progress   DIETARY INTAKE:  Avoiding processed meats usually but did have 4 hotdogs since this past weekend. No one has done a lot of cooking.  He is eating fewer vegetables since seeing me 1 month ago as he has not had an appetite for them. Does eat some processed foods Mikki [employer] mac and cheese and mashed potatoes) Very little sweets.  Eats little bread.  24-hr recall:  B ( AM): takes his son to work between 11-12:30 and wakes late Snk ( AM):   L ( PM): leftovers Snk ( PM):  D ( PM): hamburger (sometimes without bread), salad  Snk ( PM): 1 rib with BBQ sauce Beverages: Water 32 oz per day, Beer (binging again recently 6-8 but not daily, rare diet soda  Usual physical activity: limited due to shortness of  breath and leg swelling  Progress Towards Goal(s):  In progress.   Nutritional Diagnosis:  NB-1.1 Food and nutrition-related knowledge deficit As related to balance of carbohydrates, protein, and fat.  As evidenced by diet hx and patient report.    Intervention:  Nutrition education continued regarding decreased sodium, fluid intake, improvement of balance of meals and simple meals that are plant based.  Recommended that he discuss his increased fluid build up with his MD.  He binge drinks at times and addressed this as well.  Plan: Please speak to Dr. Claiborne Billings or your Kidney MD about  your fluid retention.  Need consistency with meals.  Continue to work to decrease your salt/sodium intake.  Avoid all processed meats and other processed food. Read the label for sodium (no more than 600 mg total sodium per meal).  Best to choose foods natural as they are grown. Aim for 3 bottles of water daily. But less is fine if you are having other beverages. Recommend avoiding alcohol.  Include a fruit with most meals. Include a vegetable with each meal.  Begin taking your medication as prescribed Continue to check your blood sugar as prescribed.  Plant based resource: [employer] Diet   Teaching Method Utilized:  Auditory   Barriers to learning/adherence to lifestyle change: finances, health, motivation  Demonstrated degree of understanding via:  Teach Back   Monitoring/Evaluation:  Dietary intake, exercise, and body weight in 6 week(s).

## 2020-04-10 NOTE — Patient Instructions (Addendum)
Please speak to Dr. Claiborne Billings or your Kidney MD about your fluid retention.  Need consistency with meals.  Continue to work to decrease your salt/sodium intake.  Avoid all processed meats and other processed food. Read the label for sodium (no more than 600 mg total sodium per meal).  Best to choose foods natural as they are grown. Aim for 3 bottles of water daily. But less is fine if you are having other beverages. Recommend avoiding alcohol.  Include a fruit with most meals. Include a vegetable with each meal.  Begin taking your medication as prescribed Continue to check your blood sugar as prescribed.   Plant based resource: [employer] Diet  Museum/gallery conservator

## 2020-04-22 ENCOUNTER — Other Ambulatory Visit: Payer: Self-pay | Admitting: Physician Assistant

## 2020-04-28 ENCOUNTER — Other Ambulatory Visit: Payer: Self-pay

## 2020-04-28 ENCOUNTER — Other Ambulatory Visit: Payer: Self-pay | Admitting: Family Medicine

## 2020-04-28 ENCOUNTER — Ambulatory Visit
Admission: RE | Admit: 2020-04-28 | Discharge: 2020-04-28 | Disposition: A | Discharge status: Home / Self Care | Payer: Medicaid Other | Source: Ambulatory Visit | Attending: Family Medicine | Admitting: Family Medicine

## 2020-04-28 DIAGNOSIS — S8001XA Contusion of right knee, initial encounter: Secondary | ICD-10-CM

## 2020-05-02 ENCOUNTER — Telehealth: Payer: Self-pay | Admitting: Internal Medicine

## 2020-05-02 ENCOUNTER — Ambulatory Visit: Payer: Self-pay

## 2020-05-02 ENCOUNTER — Ambulatory Visit (INDEPENDENT_AMBULATORY_CARE_PROVIDER_SITE_OTHER): Payer: Medicaid Other | Admitting: Orthopaedic Surgery

## 2020-05-02 ENCOUNTER — Encounter: Payer: Self-pay | Admitting: Orthopaedic Surgery

## 2020-05-02 ENCOUNTER — Other Ambulatory Visit: Payer: Self-pay

## 2020-05-02 VITALS — Ht 69.0 in | Wt 295.0 lb

## 2020-05-02 DIAGNOSIS — G8929 Other chronic pain: Secondary | ICD-10-CM

## 2020-05-02 DIAGNOSIS — M25511 Pain in right shoulder: Secondary | ICD-10-CM | POA: Diagnosis not present

## 2020-05-02 MED ORDER — TRAMADOL HCL 50 MG PO TABS: 50.0000 mg | ORAL_TABLET | Freq: Four times a day (QID) | ORAL | 0 refills | Status: DC | PRN

## 2020-05-02 NOTE — Telephone Encounter (Signed)
This is something that the patient will need to contact his insurance and follow up on. Our part has been completed, we sent in the PA request and it was approved.

## 2020-05-02 NOTE — Progress Notes (Signed)
Subjective: Patient is here for ultrasound-guided intra-articular right glenohumeral injection.  His last injection helped for couple months but it wore off around January and he has been dealing with the pain since then.  Objective: Pain at the extremes of range of motion.  Procedure: Ultrasound-guided right glenohumeral injection: After sterile prep with Betadine, injected 8 cc 1% lidocaine without epinephrine and 40 mg methylprednisolone using a 22-gauge spinal needle, passing the needle from posterior approach into the glenohumeral joint.  Injectate seen filling the joint capsule.  He had good immediate relief.

## 2020-05-02 NOTE — Telephone Encounter (Signed)
Drai from [facility] ph# [phone] called re: PA for Victoza is not going through for Patient's Victoza. Drai was given PA/Reference#, however, Oswaldo Milian said that the numbers given do not always go through with the insurance and requests that the PA be followed up on.

## 2020-05-02 NOTE — Progress Notes (Signed)
Office Visit Note   Patient: Joshua [name]           Date of Birth: 24-Aug-1956           MRN: [id number] Visit Date: 05/02/2020              Requested by: [name], MD [address] [place],  [place] [id number] PCP: [name], MD   Assessment & Plan: Visit Diagnoses:  1. Chronic right shoulder pain     Plan: We repeated the cortisone injection today with Dr. Junius Roads.  Refilled his tramadol..  Follow-up as needed.  Follow-Up Instructions: No follow-ups on file.   Orders:  Orders Placed This Encounter  Procedures  . US Guided Needle Placement - No Linked Charges   Meds ordered this encounter  Medications  . traMADol (ULTRAM) 50 MG tablet    Sig: Take 1 tablet (50 mg total) by mouth every 6 (six) hours as needed.    Dispense:  30 tablet    Refill:  0      Procedures: No procedures performed   Clinical Data: No additional findings.   Subjective: Chief Complaint  Patient presents with  . Right Shoulder - Pain    Joshua [name] returns today for follow-up of his right shoulder OA.  Previous cortisone injection was November last year which gave him good relief.  Denies any changes.   Review of Systems   Objective: Vital Signs: Ht '5\' 9"'$  (1.753 m)   Wt 295 lb (133.8 kg)   BMI 43.56 kg/m   Physical Exam  Ortho Exam Exam is unchanged. Specialty Comments:  No specialty comments available.  Imaging: US Guided Needle Placement - No Linked Charges  Result Date: 05/02/2020 Ultrasound-guided right glenohumeral injection: After sterile prep with Betadine, injected 8 cc 1% lidocaine without epinephrine and 40 mg methylprednisolone using a 22-gauge spinal needle, passing the needle from posterior approach into the glenohumeral joint.  Injectate seen filling the joint capsule.  He had good immediate relief.    PMFS History: Patient Active Problem List   Diagnosis Date Noted  . Unstable angina ([place]) 08/17/2019  . Chest pain 08/16/2019  . NSTEMI  (non-ST elevated myocardial infarction) ([place]) 04/06/2019  . ACS (acute coronary syndrome) ([place]) 04/06/2019  . Sepsis ([place]) 03/15/2018  . DKA (diabetic ketoacidoses) ([place]) 03/15/2018  . Special screening for malignant neoplasms, colon 08/22/2017  . Acute renal failure superimposed on stage 3 chronic kidney disease ([employer]) 05/19/2017  . Essential hypertension   . Status post transmetatarsal amputation of foot, right ([place]) 11/08/2016  . Diabetic polyneuropathy associated with type 2 diabetes mellitus ([place]) 11/08/2016  . Pure hypercholesterolemia   . AKI (acute kidney injury) ([place])   . Acute blood loss anemia   . DM type 2 with diabetic peripheral neuropathy ([place])   . Physical debility 08/03/2016  . Chronic osteomyelitis of right foot ([place])   . Cellulitis and abscess of leg, except foot   . Penetrating wound of right foot   . CKD (chronic kidney disease) stage 3, GFR 30-59 ml/min (HCC)   . Unstable angina pectoris ([place]) 07/18/2016  . Chronic combined systolic and diastolic congestive heart failure ([place])   . Chest pain with moderate risk for cardiac etiology 06/24/2014  . CAD S/P multiple PCIs 06/24/2014  . Sleep apnea- on C-pap 06/24/2014  . Morbid obesity due to excess calories ([place]) 11/10/2013  . Hyperglycemia 10/29/2013  . Hyperlipidemia 01/12/2011  . Benign essential HTN 01/12/2011  . GERD 01/12/2011  Past Medical History:  Diagnosis Date  . Anemia   . Chronic combined systolic and diastolic CHF (congestive heart failure) ([place])    a. 07/2015 Echo: EF 45-50%, mod LVH, mid apical/antsept AK, Gr 1 DD.  [name] CKD (chronic kidney disease), stage III   . Coronary artery disease    a. 2012 s/p MI/PCI: s/p DES to mid/dist RCA and overlapping DES to LAD;  b. 07/2015 Cath: LAD 10% ISR, D1 40(jailed), LCX 54m, OM2 30, OM3 30, RCA 59m ISR, 10d ISR; c. 07/2016 NSTEMI/PCI: LAD 85ost/p/m (3.5x20 Synergy DES overlapping prior stent), D1 40, LCX 24m, OM2 95 (2.75x16 Synergy DES), OM3 30, RCA 27m, 10d.  .  Depression   . Diabetic gastroparesis ([place])   . DKA (diabetic ketoacidoses) ([place]) 03/14/2018  . GERD (gastroesophageal reflux disease)   . Gout   . Heart murmur   . Hyperlipemia   . Hypertensive heart disease   . Ischemic cardiomyopathy    a. 07/2015 Echo: EF 45-50%, mod LVH, mid-apicalanteroseptal AK, Gr1 DD.  . Morbid obesity ([place])   . OSA on CPAP   . Osteomyelitis ([place])    a. 07/2016 MTP joint of R great toe s/p transmetatarsal amputation.  . Polyneuropathy in diabetes(357.2)   . Pulmonary sarcoidosis ([place])    "problems w/it years ago; no problems anymore" (07/03/2014)  . Rectal bleeding   . Sleep apnea   . Type II diabetes mellitus (HCC)     Family History  Problem Relation Age of Onset  . Heart attack Maternal Aunt   . Diabetes Maternal Grandmother   . Hypertension Maternal Grandmother     Past Surgical History:  Procedure Laterality Date  . AMPUTATION Right 07/24/2016   Procedure: TRANSMETATARSAL FOOT AMPUTATION;  Surgeon: [name], MD;  Location: [facility];  Service: Orthopedics;  Laterality: Right;  . BALLOON DILATION N/A 03/21/2018   Procedure: BALLOON DILATION;  Surgeon: [name], MD;  Location: [facility];  Service: Endoscopy;  Laterality: N/A;  . BREAST LUMPECTOMY Right ~ 2000   "benign"  . CARDIAC CATHETERIZATION N/A 07/30/2015   Procedure: Left Heart Cath and Coronary Angiography;  Surgeon: Burnell Blanks, MD;  Location: [facility];  Service: Cardiovascular;  Laterality: N/A;  . CARDIAC CATHETERIZATION N/A 07/19/2016   Procedure: Left Heart Cath and Coronary Angiography;  Surgeon: Belva Crome, MD;  Location: [facility];  Service: Cardiovascular;  Laterality: N/A;  . CARDIAC CATHETERIZATION N/A 07/29/2016   Procedure: Coronary Stent Intervention;  Surgeon: Belva Crome, MD;  Location: [facility];  Service: Cardiovascular;  Laterality: N/A;  . CARDIAC CATHETERIZATION N/A 07/29/2016   Procedure: Intravascular Ultrasound/IVUS;   Surgeon: Belva Crome, MD;  Location: [facility];  Service: Cardiovascular;  Laterality: N/A;  . CATARACT EXTRACTION W/ INTRAOCULAR LENS  IMPLANT, BILATERAL Bilateral 2014-2015  . COLONOSCOPY WITH PROPOFOL N/A 08/22/2017   Procedure: COLONOSCOPY WITH PROPOFOL;  Surgeon: [name], MD;  Location: [facility];  Service: Endoscopy;  Laterality: N/A;  . CORONARY ANGIOPLASTY WITH STENT PLACEMENT  10/31/2011   30 % MID ATRIOVENTRICULAR GROOVE CX STENOSIS. 90 % MID RCA.PTA TO LAD/STENTING OF TANDEM 60-70%. LAD STENOSIS 99% REDUCED TO 0%.3.0 X 32MM PROMUS DES POSTDILATED TO 3.25MM.  [name] CORONARY ANGIOPLASTY WITH STENT PLACEMENT  07/03/2014   "2"  . CORONARY STENT INTERVENTION N/A 04/09/2019   Procedure: CORONARY STENT INTERVENTION;  Surgeon: Jettie Booze, MD;  Location: [facility];  Service: Cardiovascular;  Laterality: N/A;  .  CORONARY STENT INTERVENTION N/A 08/22/2019   Procedure: CORONARY STENT INTERVENTION;  Surgeon: Martinique, Peter M, MD;  Location: [facility];  Service: Cardiovascular;  Laterality: N/A;  . ESOPHAGOGASTRODUODENOSCOPY (EGD) WITH PROPOFOL N/A 03/21/2018   Procedure: ESOPHAGOGASTRODUODENOSCOPY (EGD) WITH PROPOFOL;  Surgeon: [name], MD;  Location: [facility];  Service: Endoscopy;  Laterality: N/A;  . I & D EXTREMITY Right 10/30/2013   Procedure: IRRIGATION AND DEBRIDEMENT RIGHT SMALL FINGER POSSIBLE SECONDARY WOUND CLOSURE;  Surgeon: [name], MD;  Location: [facility];  Service: Orthopedics;  Laterality: Right;  [name] is available after 4pm  . I & D EXTREMITY Right 11/01/2013   Procedure:  IRRIGATION AND DEBRIDEMENT RIGHT  PROXIMAL PHALANGEAL LEVEL AMPUTATION;  Surgeon: [name], MD;  Location: [facility];  Service: Orthopedics;  Laterality: Right;  . INCISION AND DRAINAGE Right 10/28/2013   Procedure: INCISION AND DRAINAGE Right small finger;  Surgeon: [name], MD;  Location: [facility];  Service: Orthopedics;  Laterality:  Right;  . LEFT HEART CATH Left 11/03/2011   Procedure: LEFT HEART CATH;  Surgeon: [name], MD;  Location: [facility];  Service: Cardiovascular;  Laterality: Left;  . LEFT HEART CATH AND CORONARY ANGIOGRAPHY N/A 04/09/2019   Procedure: LEFT HEART CATH AND CORONARY ANGIOGRAPHY;  Surgeon: Jettie Booze, MD;  Location: [facility];  Service: Cardiovascular;  Laterality: N/A;  . LEFT HEART CATHETERIZATION WITH CORONARY ANGIOGRAM N/A 07/03/2014   Procedure: LEFT HEART CATHETERIZATION WITH CORONARY ANGIOGRAM;  Surgeon: [name], MD;  Location: [facility];  Service: Cardiovascular;  Laterality: N/A;  . PERCUTANEOUS CORONARY STENT INTERVENTION (PCI-S) Left 11/03/2011   Procedure: PERCUTANEOUS CORONARY STENT INTERVENTION (PCI-S);  Surgeon: [name], MD;  Location: [facility]ATH LAB;  Service: Cardiovascular;  Laterality: Left;  . RIGHT/LEFT HEART CATH AND CORONARY ANGIOGRAPHY N/A 08/22/2019   Procedure: RIGHT/LEFT HEART CATH AND CORONARY ANGIOGRAPHY;  Surgeon: Martinique, Peter M, MD;  Location: [facility];  Service: Cardiovascular;  Laterality: N/A;  . TOE AMPUTATION Right ~ 2010   "2nd toe"  . TOE AMPUTATION Right 2012   "3rd toe"   Social History   Occupational History  . Not on file  Tobacco Use  . Smoking status: Never Smoker  . Smokeless tobacco: Never Used  Substance and Sexual Activity  . Alcohol use: Yes    Comment: social   . Drug use: No  . Sexual activity: Yes

## 2020-05-06 ENCOUNTER — Other Ambulatory Visit: Payer: Self-pay

## 2020-05-06 ENCOUNTER — Encounter: Payer: Self-pay | Admitting: Dietician

## 2020-05-06 ENCOUNTER — Encounter: Payer: Medicaid Other | Attending: Internal Medicine | Admitting: Dietician

## 2020-05-06 DIAGNOSIS — E1169 Type 2 diabetes mellitus with other specified complication: Secondary | ICD-10-CM

## 2020-05-06 DIAGNOSIS — N184 Chronic kidney disease, stage 4 (severe): Secondary | ICD-10-CM

## 2020-05-06 DIAGNOSIS — E1142 Type 2 diabetes mellitus with diabetic polyneuropathy: Secondary | ICD-10-CM | POA: Diagnosis not present

## 2020-05-06 NOTE — Progress Notes (Signed)
Medical Nutrition Therapy:  Appt start time: 1856 end time:  3149.   Assessment:  Primary concerns today: Patient is here today alone.  He is a patient of [name].  He was last seen by myself 04/10/2020..  More consistent with insulin.  He has been taking Victoza but needs to call insurance about pre authorization approval before he can take this again. He now has a new mask for OSA.  Increased stress due to water damage to house.  They are currently living together in one room and can only go to the house for short periods.  He states that there are other problems with the house as well.  They cannot cook in the room.  He has been eating instant rice with pinto's, occasional fried chicken, fruit.  In the past, he states that he had been working more towards a plant based diet. Still states that he is not eating a lot but denies that money issues are a concern. Continues to drink beer because he likes the flavor but stated that this is not to help cope with stress.   History includes:  Type 2 Diabetes, CHF, CKD4, gastroparesis. Medications include:  Lantus 30 units q day and Novolog based on a sliding scale.  He verbalized that he was taking 30 units when he eats.  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 He states he is not currently wearing a mask and is not getting into a good restful sleep.  He reports increased depression related to his wife's declining health, financial issues, and his own health. His son is living with them and paying the mortgage. His wife has mobility issues. He does most of the shopping and shares cooking with his son but eats take out very  frequently. He states that shopping causes him increased physical pain. He finds it difficult to meet his own nutritional needs as well as the preferences of his family simply and within the needs of his family. He is currently on disability and previous jobs were very physically active. Middle son type 2 diagnosed with diabetes 3 weeks ago.  States that his oldest son uses alcohol and they encourage each other in this habit  Preferred Learning Style:   No preference indicated   Learning Readiness:   Ready   DIETARY INTAKE:  Progress Towards Goal(s):  In progress.   Nutritional Diagnosis:  NB-1.1 Food and nutrition-related knowledge deficit As related to balance of carbohydrates, protein, and fat.  As evidenced by diet hx and patient report.    Intervention:  Nutrition education continued re diabetes and CKD. Encouraged limiting beer to <2 daily and adequate water intake.  Discussed balanced nutrition and simple ideas for foods to prepare in a microwave.  Encouraged consistency of meals and continued mindfulness regarding sodium.  Listened to his current social situations that are making self care challenging and encouraged continued consistency with medications. Discussed for him to call me or his MD if affording medication becomes an issue as their are other options available.  Teaching Method Utilized:  Auditory  Barriers to learning/adherence to lifestyle change: social, financial, health, motivation  Demonstrated degree of understanding via:  Teach Back  Monitoring/Evaluation:  Dietary intake, exercise, and body weight in 1 month(s).

## 2020-05-06 NOTE — Patient Instructions (Addendum)
Take your medication regularly. Try to eat consistently Balance your fluid intake.  Consider less beer and alternate with water sugar free or unsweetened soda or tea.

## 2020-05-07 MED ORDER — VICTOZA 18 MG/3ML ~~LOC~~ SOPN: 1.8000 mg | PEN_INJECTOR | Freq: Every day | SUBCUTANEOUS | 2 refills | Status: DC

## 2020-05-07 NOTE — Addendum Note (Signed)
Addended by: Cardell Peach I on: 05/07/2020 08:57 AM   Modules accepted: Orders

## 2020-05-07 NOTE — Telephone Encounter (Signed)
[facility] called wanting to speak to the nurse that does the PA's for Dr Cruzita Lederer regarding the [place].  Ailene Ravel with [facility]

## 2020-05-07 NOTE — Telephone Encounter (Signed)
Contacted [facility] [employer] and despite the approval given they now want quantity changed.  RX changed and sent to pharmacy.

## 2020-05-13 ENCOUNTER — Other Ambulatory Visit: Payer: Self-pay

## 2020-05-15 ENCOUNTER — Ambulatory Visit: Payer: Medicaid Other | Admitting: Internal Medicine

## 2020-05-15 ENCOUNTER — Encounter: Payer: Self-pay | Admitting: Internal Medicine

## 2020-05-15 ENCOUNTER — Other Ambulatory Visit: Payer: Self-pay

## 2020-05-15 VITALS — BP 142/80 | HR 90 | Ht 69.0 in | Wt 309.0 lb

## 2020-05-15 DIAGNOSIS — E1159 Type 2 diabetes mellitus with other circulatory complications: Secondary | ICD-10-CM

## 2020-05-15 DIAGNOSIS — E1165 Type 2 diabetes mellitus with hyperglycemia: Secondary | ICD-10-CM

## 2020-05-15 DIAGNOSIS — E785 Hyperlipidemia, unspecified: Secondary | ICD-10-CM | POA: Diagnosis not present

## 2020-05-15 DIAGNOSIS — E669 Obesity, unspecified: Secondary | ICD-10-CM | POA: Diagnosis not present

## 2020-05-15 LAB — POCT GLYCOSYLATED HEMOGLOBIN (HGB A1C): Hemoglobin A1C: 8.9 % — AB (ref 4.0–5.6)

## 2020-05-15 MED ORDER — ACCU-CHEK GUIDE VI STRP: ORAL_STRIP | 3 refills | Status: DC

## 2020-05-15 NOTE — Patient Instructions (Addendum)
Please continue: - Lantus 30 units a day 2x a day - NovoLog 10-15 units before meals (15 min before you eat) - Victoza 1.8 mg daily before breakfast.  Please come back for a follow-up appointment in 3 months.

## 2020-05-15 NOTE — Addendum Note (Signed)
Addended by: Cardell Peach I on: 05/15/2020 04:35 PM   Modules accepted: Orders

## 2020-05-15 NOTE — Progress Notes (Signed)
[name] ID: Joshua [name], male   DOB: 1956-03-04, 64 y.o.   MRN: [id number]   This visit occurred during the SARS-CoV-2 public health emergency.  Safety protocols were in place, including screening questions prior to the visit, additional usage of staff PPE, and extensive cleaning of exam room while observing appropriate contact time as indicated for disinfecting solutions.   HPI: Joshua [name] is a 64 y.o.-year-old male, initially referred by his PCP, Dr. Alyson Ingles, returning for follow-up for DM2, dx in 1990s, insulin-dependent, uncontrolled, with complications (CAD - s/p AMI 2012, s/p PCIs, ischemic cardiomyopathy, CHF, CKD stage 4, PN - s/p R transmetatarsal amputation, gastroparesis, history of DKA). He saw Dr Dwyane Dee initially, then Dr. Chalmers Cater for 20 years, but she is not accepting Medicaid patients anymore so he had to switch providers.  Last visit with me 2.5 months ago.  Reviewed HbA1c levels: Lab Results  Component Value Date   HGBA1C 13.8 (A) 01/28/2020   HGBA1C 10.2 (H) 08/16/2019   HGBA1C 15.0 (H) 04/07/2019   HGBA1C >15.5 (H) 03/15/2018   HGBA1C 11.8 (H) 05/19/2017   HGBA1C 8.5 (H) 11/17/2016   HGBA1C 9.5 (H) 07/18/2016   HGBA1C 10.8 (H) 02/16/2016   HGBA1C 14.2 (H) 10/13/2015   HGBA1C 10.0 (H) 07/03/2014   He was on: - Lantus 20 units 2x a day - NovoLog 10 units 3x a day, before meals Prev. On 70/30 until Spring 2020.  We changed to: - Lantus 30 units a day 2x a day - NovoLog 10-15 units before meals (15 min before you eat) - occasionally takes it after eating - Victoza 1.8 mg daily before breakfast - started 01/2020 He could not start Ozempic 0.5 mg as suggested 01/2020.  He checks sugars 0-2 times a day: - am: 50, ave 130 >> 163, 290, 329 >> 84 - 2h after b'fast: n/c - before lunch: n/c >> 266 >> 104-187, 219 - 2h after lunch: n/c >> 274 - before dinner: n/c >> 134, 217 - 2h after dinner: n/c - bedtime: n/c >> 285 >> 213, 336 - nighttime: n/c >> HI >>  73-215 Lowest sugar was 50 >> 163 >> 73; he has hypoglycemia awareness at 100.  Highest sugar was 400 >> HI >> 336.  Glucometer: ReliOn  Pt's meals are: - Breakfast: skips - Lunch: may skip, PB or chicken - Dinner: chicken w/o sides or with + veggies + starch;  - Snacks: occasional sweets: Hershey bar, sweet tea, cool aid He drinks diet sodas and water.  -+ CKD stage IV, last BUN/creatinine:  Lab Results  Component Value Date   BUN 35 (H) 08/23/2019   BUN 38 (H) 08/22/2019   CREATININE 2.78 (H) 08/23/2019   CREATININE 3.01 (H) 08/22/2019   Lab Results  Component Value Date   GFRAA 27 (L) 08/23/2019   GFRAA 25 (L) 08/22/2019   GFRAA 24 (L) 08/21/2019   GFRAA 22 (L) 08/20/2019   GFRAA 29 (L) 08/19/2019  Is not on ACE inhibitor/ARB.  -+ HL; last set of lipids: Lab Results  Component Value Date   CHOL 168 08/17/2019   HDL 85 08/17/2019   LDLCALC 71 08/17/2019   TRIG 60 08/17/2019   CHOLHDL 2.0 08/17/2019  On Crestor 40, Zetia 10.  - last eye exam was in 11/2019: No DR. Had cataract sx in 2014, 2015.  -+ Numbness and tingling in his feet.  On ASA 81.  Pt has FH of DM in [place].  He also has a history of HTN,  OSA, GERD, esophageal ulcers.  He denies h/o pancreatitis or FH of MTC.  ROS: Constitutional: + weight gain/no weight loss, + fatigue, no subjective hyperthermia, no subjective hypothermia Eyes: no blurry vision, no xerophthalmia ENT: no sore throat, no nodules palpated in neck, no dysphagia, no odynophagia, no hoarseness Cardiovascular: no CP/+ SOB/no palpitations/no leg swelling Respiratory: no cough/+ SOB/no wheezing Gastrointestinal: no N/no V/no D/no C/+ acid reflux Musculoskeletal: no muscle aches/+ joint aches Skin: no rashes, no hair loss Neurological: no tremors/no numbness/no tingling/no dizziness, + dysequilibrium - fell few times since last OV  I reviewed pt's medications, allergies, PMH, social hx, family hx, and changes were documented in the  history of present illness. Otherwise, unchanged from my initial visit note.  Past Medical History:  Diagnosis Date  . Anemia   . Chronic combined systolic and diastolic CHF (congestive heart failure) ([place])    a. 07/2015 Echo: EF 45-50%, mod LVH, mid apical/antsept AK, Gr 1 DD.  [name] CKD (chronic kidney disease), stage III   . Coronary artery disease    a. 2012 s/p MI/PCI: s/p DES to mid/dist RCA and overlapping DES to LAD;  b. 07/2015 Cath: LAD 10% ISR, D1 40(jailed), LCX 60m OM2 30, OM3 30, RCA 533mSR, 10d ISR; c. 07/2016 NSTEMI/PCI: LAD 85ost/p/m (3.5x20 Synergy DES overlapping prior stent), D1 40, LCX 7555mM2 95 (2.75x16 Synergy DES), OM3 30, RCA 58m75md.  . Depression   . Diabetic gastroparesis (HCC)[place]. DKA (diabetic ketoacidoses) (HCC)[place]/01/2018  . GERD (gastroesophageal reflux disease)   . Gout   . Heart murmur   . Hyperlipemia   . Hypertensive heart disease   . Ischemic cardiomyopathy    a. 07/2015 Echo: EF 45-50%, mod LVH, mid-apicalanteroseptal AK, Gr1 DD.  . Morbid obesity (HCC)[place]. OSA on CPAP   . Osteomyelitis (HCC)[place] a. 07/2016 MTP joint of R great toe s/p transmetatarsal amputation.  . Polyneuropathy in diabetes(357.2)   . Pulmonary sarcoidosis (HCC)[place] "problems w/it years ago; no problems anymore" (07/03/2014)  . Rectal bleeding   . Sleep apnea   . Type II diabetes mellitus (HCC)[place] Past Surgical History:  Procedure Laterality Date  . AMPUTATION Right 07/24/2016   Procedure: TRANSMETATARSAL FOOT AMPUTATION;  Surgeon: MarcNewt Minion;  [facility];  Laterality: Right;  . BALLOON DILATION N/A 03/21/2018   Procedure: BALLOON DILATION;  Surgeon: BuccRonald Lobo;  [facility];  Service: Endoscopy;  Laterality: N/A;  . BREAST LUMPECTOMY Right ~ 2000   "benign"  . CARDIAC CATHETERIZATION N/A 07/30/2015   Procedure: Left Heart Cath and Coronary Angiography;  Surgeon: ChriBurnell Blanks;  [facility];   Service: Cardiovascular;  Laterality: N/A;  . CARDIAC CATHETERIZATION N/A 07/19/2016   Procedure: Left Heart Cath and Coronary Angiography;  Surgeon: HenrBelva Crome;  [facility];  Service: Cardiovascular;  Laterality: N/A;  . CARDIAC CATHETERIZATION N/A 07/29/2016   Procedure: Coronary Stent Intervention;  Surgeon: HenrBelva Crome;  [facility];  Service: Cardiovascular;  Laterality: N/A;  . CARDIAC CATHETERIZATION N/A 07/29/2016   Procedure: Intravascular Ultrasound/IVUS;  Surgeon: HenrBelva Crome;  [facility];  Service: Cardiovascular;  Laterality: N/A;  . CATARACT EXTRACTION W/ INTRAOCULAR LENS  IMPLANT, BILATERAL Bilateral 2014-2015  . COLONOSCOPY WITH PROPOFOL N/A 08/22/2017   Procedure: COLONOSCOPY WITH PROPOFOL;  Surgeon: SchoWilford Corner;  [facility];  Service: Endoscopy;  Laterality: N/A;  . CORONARY ANGIOPLASTY WITH STENT PLACEMENT  10/31/2011   30 % MID ATRIOVENTRICULAR GROOVE CX STENOSIS. 90 % MID RCA.PTA TO LAD/STENTING OF TANDEM 60-70%. LAD STENOSIS 99% REDUCED TO 0%.3.0 X 32MM PROMUS DES POSTDILATED TO 3.25MM.  [name] CORONARY ANGIOPLASTY WITH STENT PLACEMENT  07/03/2014   "2"  . CORONARY STENT INTERVENTION N/A 04/09/2019   Procedure: CORONARY STENT INTERVENTION;  Surgeon: Jettie Booze, MD;  Location: [place] CV LAB;  Service: Cardiovascular;  Laterality: N/A;  . CORONARY STENT INTERVENTION N/A 08/22/2019   Procedure: CORONARY STENT INTERVENTION;  Surgeon: Martinique, Peter M, MD;  Location: [place] CV LAB;  Service: Cardiovascular;  Laterality: N/A;  . ESOPHAGOGASTRODUODENOSCOPY (EGD) WITH PROPOFOL N/A 03/21/2018   Procedure: ESOPHAGOGASTRODUODENOSCOPY (EGD) WITH PROPOFOL;  Surgeon: [name], MD;  Location: [place];  Service: Endoscopy;  Laterality: N/A;  . I & D EXTREMITY Right 10/30/2013   Procedure: IRRIGATION AND DEBRIDEMENT RIGHT SMALL FINGER POSSIBLE SECONDARY WOUND CLOSURE;  Surgeon:  [name], MD;  Location: [facility];  Service: Orthopedics;  Laterality: Right;  [name] is available after 4pm  . I & D EXTREMITY Right 11/01/2013   Procedure:  IRRIGATION AND DEBRIDEMENT RIGHT  PROXIMAL PHALANGEAL LEVEL AMPUTATION;  Surgeon: [name], MD;  Location: [facility];  Service: Orthopedics;  Laterality: Right;  . INCISION AND DRAINAGE Right 10/28/2013   Procedure: INCISION AND DRAINAGE Right small finger;  Surgeon: [name], MD;  Location[facility];  Service: Orthopedics;  Laterality: Right;  . LEFT HEART CATH Left 11/03/2011   Procedure: LEFT HEART CATH;  Surgeon: [name], MD;  Location[facility] CATH LAB;  Service: Cardiovascular;  Laterality: Left;  . LEFT HEART CATH AND CORONARY ANGIOGRAPHY N/A 04/09/2019   Procedure: LEFT HEART CATH AND CORONARY ANGIOGRAPHY;  Surgeon: Jettie Booze, MD;  Location: [place] CV LAB;  Service: Cardiovascular;  Laterality: N/A;  . LEFT HEART CATHETERIZATION WITH CORONARY ANGIOGRAM N/A 07/03/2014   Procedure: LEFT HEART CATHETERIZATION WITH CORONARY ANGIOGRAM;  Surgeon: [name], MD;  Location: [facility] CATH LAB;  Service: Cardiovascular;  Laterality: N/A;  . PERCUTANEOUS CORONARY STENT INTERVENTION (PCI-S) Left 11/03/2011   Procedure: PERCUTANEOUS CORONARY STENT INTERVENTION (PCI-S);  Surgeon: [name], MD;  [facility] LAB;  Service: Cardiovascular;  Laterality: Left;  . RIGHT/LEFT HEART CATH AND CORONARY ANGIOGRAPHY N/A 08/22/2019   Procedure: RIGHT/LEFT HEART CATH AND CORONARY ANGIOGRAPHY;  Surgeon: Martinique, Peter M, MD;  Location: [place] CV LAB;  Service: Cardiovascular;  Laterality: N/A;  . TOE AMPUTATION Right ~ 2010   "2nd toe"  . TOE AMPUTATION Right 2012   "3rd toe"   Social History   Socioeconomic History  . Marital status: Married    Spouse name: Not on file  . Number of children: Not on file  . Years of education: Not on file  . Highest education level: Not on file  Occupational  History  . Not on file  Tobacco Use  . Smoking status: Never Smoker  . Smokeless tobacco: Never Used  Substance and Sexual Activity  . Alcohol use: Yes    Comment: social   . Drug use: No  . Sexual activity: Yes  Other Topics Concern  . Not on file  Social History Narrative  . Not on file   Social Determinants of Health   Financial Resource Strain:   . Difficulty of Paying Living Expenses:   Food Insecurity:   .  Worried About Charity fundraiser in the Last Year:   . Arboriculturist in the Last Year:   Transportation Needs:   . Film/video editor (Medical):   [name] Lack of Transportation (Non-Medical):   Physical Activity:   . Days of Exercise per Week:   . Minutes of Exercise per Session:   Stress:   . Feeling of Stress :   Social Connections:   . Frequency of Communication with Friends and Family:   . Frequency of Social Gatherings with Friends and Family:   . Attends Religious Services:   . Active Member of Clubs or Organizations:   . Attends Archivist Meetings:   [name] Marital Status:   Intimate Partner Violence:   . Fear of Current or Ex-Partner:   . Emotionally Abused:   [name] Physically Abused:   . Sexually Abused:    Current Outpatient Medications on File Prior to Visit  Medication Sig Dispense Refill  . Accu-Chek FastClix Lancets MISC USE UP TO FOUR TIMES DAILY AS DIRECTED    . ACCU-CHEK GUIDE test strip USE UP TO FOUR TIMES DAILY AS DIRECTED    . acetaminophen (TYLENOL) 650 MG CR tablet Take 1,300 mg by mouth daily as needed for pain.    [name] allopurinol (ZYLOPRIM) 300 MG tablet Take 300 mg by mouth daily.     [name] amLODipine (NORVASC) 10 MG tablet Take 1 tablet (10 mg total) by mouth daily. 90 tablet 1  . aspirin EC 81 MG tablet Take 81 mg by mouth daily.     . blood glucose meter kit and supplies Dispense based on patient and insurance preference. Use up to four times daily as directed. (FOR ICD-10 E10.9, E11.9). 1 each 0  . BRILINTA 90 MG TABS tablet Take  1 tablet by mouth twice daily 180 tablet 1  . ezetimibe (ZETIA) 10 MG tablet Take 1 tablet (10 mg total) by mouth daily. 90 tablet 3  . furosemide (LASIX) 80 MG tablet Take 80 mg by mouth.    . hydrALAZINE (APRESOLINE) 100 MG tablet Take 100 mg by mouth 3 (three) times daily.    . insulin aspart (NOVOLOG) 100 UNIT/ML injection Inject 10-15 Units into the skin 2 (two) times daily. Sliding scale 30 mL 3  . insulin glargine (LANTUS) 100 UNIT/ML injection Inject 0.3 mLs (30 Units total) into the skin 2 (two) times daily. 40 mL 3  . isosorbide mononitrate (IMDUR) 120 MG 24 hr tablet Take 1 tablet (120 mg total) by mouth 2 (two) times daily.    [name] liraglutide (VICTOZA) 18 MG/3ML SOPN Inject 0.3 mLs (1.8 mg total) into the skin daily. 9 mL 2  . metoprolol succinate (TOPROL-XL) 50 MG 24 hr tablet Take 3 tablets (150 mg total) by mouth daily. Take with or immediately following a meal. 90 tablet 0  . nitroGLYCERIN (NITROSTAT) 0.4 MG SL tablet Place 1 tablet (0.4 mg total) under the tongue every 5 (five) minutes as needed for chest pain. 25 tablet 1  . pantoprazole (PROTONIX) 40 MG tablet TAKE 1 TABLET BY MOUTH TWICE DAILY BEFORE MEAL(S) 180 tablet 0  . potassium chloride (KLOR-CON) 20 MEQ packet Take by mouth 2 (two) times daily. Unknown amount (2 pills daily)    . rosuvastatin (CRESTOR) 40 MG tablet Take 1 tablet by mouth once daily 30 tablet 0  . traMADol (ULTRAM) 50 MG tablet Take 1 tablet (50 mg total) by mouth every 6 (six) hours as needed. 30 tablet 0  No current facility-administered medications on file prior to visit.   Allergies  Allergen Reactions  . Chlorthalidone Other (See Comments)    Causes dehydration, kidneys shut down   Family History  Problem Relation Age of Onset  . Heart attack Maternal Aunt   . Diabetes Maternal Grandmother   . Hypertension Maternal Grandmother     PE: BP (!) 142/80   Pulse 90   Ht '5\' 9"'  (1.753 m)   Wt (!) 309 lb (140.2 kg)   SpO2 95%   BMI 45.63 kg/m   Wt Readings from Last 3 Encounters:  05/15/20 (!) 309 lb (140.2 kg)  05/02/20 295 lb (133.8 kg)  03/12/20 295 lb (133.8 kg)   Constitutional: overweight, in NAD Eyes: PERRLA, EOMI, no exophthalmos ENT: moist mucous membranes, no thyromegaly, no cervical lymphadenopathy Cardiovascular: RRR, No MRG, + B LE swelling R>L Respiratory: CTA B Gastrointestinal: abdomen soft, NT, ND, BS+ Musculoskeletal: + deformities: Right transmetatarsal amputation, strength intact in all 4 Skin: moist, warm, no rashes Neurological: no tremor with outstretched hands, DTR normal in all 4  ASSESSMENT: 1. DM2, insulin-dependent, uncontrolled, with complications - CAD - s/p AMI 2012, s/p multiple PCI's; ischemic cardiomyopathy; CHF. Dr. Claiborne Billings - CKD stage 4 - PN - s/p R transmetatarsal amputation - Gastroparesis - History of DKA  PLAN:  1. Patient with longstanding, uncontrolled, type 2 diabetes, on basal-bolus insulin regimen and daily GLP-1 receptor agonist added at last visit.  At that time, HbA1c was very high, is 13.8%.   - Unfortunately, he was not checking sugars 4 times a day to be able to obtain him a CGM (he was only checking 0 to twice a day). -We tried to start him on Ozempic but this was not covered by his insurance so at last visit his sugars were still very high and fluctuating, usually above 200.  We increase his Lantus and NovoLog and discussed about trying to start Victoza.  We started at a low dose and increased to 1.8 mg daily, which he continues today.  I also advised him to stop drinking diet sodas and to have a more structured meal time.  He was also forgetting many insulin doses and discussed about the importance of injecting these before every meal. - at this visit, sugars are still very fluctuating : 73-336, but overall improved -Upon questioning, he is taking the NovoLog usually after meals and this is most likely the reason why the sugars are still fluctuating.  I advised him about the  importance of taking this 15 minutes before he eats.  He will try to do so. -Also, I advised him to make note in his meter whether the sugars are checked before or after meals -He has a decreased appetite on Victoza but did not lose weight since last visit.  This is most likely also compounded by fluid retention.  Otherwise, he tolerates Victoza well. -We will continue the current regimen for now - I suggested to:  Patient Instructions  Please continue: - Lantus 30 units a day 2x a day - NovoLog 10-15 units before meals (15 min before you eat) - Victoza 1.8 mg daily before breakfast.  Please come back for a follow-up appointment in 3 months.  - we checked his HbA1c: 8.9% (much lower) - advised to check sugars at different times of the day - 4x a day, rotating check times - advised for yearly eye exams >> he is UTD - return to clinic in 3 months   2. HL -  Reviewed latest lipid panel from 08/2019: All fractions at goal Lab Results  Component Value Date   CHOL 168 08/17/2019   HDL 85 08/17/2019   LDLCALC 71 08/17/2019   TRIG 60 08/17/2019   CHOLHDL 2.0 08/17/2019  -Continues Crestor and Zetia without side effects -he saw nutrition  3.  Obesity class III -He could not start Ozempic, which I recommended and would ask also help with weight loss -this was not covered.  At last visit I suggested to start Victoza, which appeared to be covered. -Before last visit he lost 25 pounds in the previous 1.5 months but this could have been related related since he increase the Lasix - gained 14 lbs since last OV - changed Lasix dose  Philemon Kingdom, MD PhD [facility] Endocrinology

## 2020-05-19 ENCOUNTER — Ambulatory Visit: Payer: Medicaid Other | Attending: Family Medicine | Admitting: Physical Therapy

## 2020-05-19 ENCOUNTER — Other Ambulatory Visit: Payer: Self-pay

## 2020-05-19 ENCOUNTER — Encounter: Payer: Self-pay | Admitting: Physical Therapy

## 2020-05-19 DIAGNOSIS — R262 Difficulty in walking, not elsewhere classified: Secondary | ICD-10-CM | POA: Diagnosis present

## 2020-05-19 DIAGNOSIS — R600 Localized edema: Secondary | ICD-10-CM | POA: Diagnosis present

## 2020-05-19 DIAGNOSIS — M6281 Muscle weakness (generalized): Secondary | ICD-10-CM | POA: Diagnosis present

## 2020-05-19 DIAGNOSIS — R296 Repeated falls: Secondary | ICD-10-CM | POA: Diagnosis not present

## 2020-05-19 NOTE — Therapy (Signed)
[place], [place], [address] Phone: [phone]   Fax:  [phone]  Physical Therapy Evaluation  Patient Details  Name: Joshua [name] MRN: [id number] Date of Birth: [birth year] Referring Provider (PT): Donald Prose, MD   Encounter Date: 05/19/2020  PT End of Session - 05/19/20 1349    Visit Number  1    Authorization Type  MCD- auth submitted 6/7    PT Start Time  1330    PT Stop Time  1418    PT Time Calculation (min)  48 min    Activity Tolerance  Patient tolerated treatment well    Behavior During Therapy  [facility] for tasks assessed/performed       Past Medical History:  Diagnosis Date   Anemia    Chronic combined systolic and diastolic CHF (congestive heart failure) ([place])    a. 07/2015 Echo: EF 45-50%, mod LVH, mid apical/antsept AK, Gr 1 DD.   CKD (chronic kidney disease), stage III    Coronary artery disease    a. 2012 s/p MI/PCI: s/p DES to mid/dist RCA and overlapping DES to LAD;  b. 07/2015 Cath: LAD 10% ISR, D1 40(jailed), LCX 86m, OM2 30, OM3 30, RCA 42m ISR, 10d ISR; c. 07/2016 NSTEMI/PCI: LAD 85ost/p/m (3.5x20 Synergy DES overlapping prior stent), D1 40, LCX 75m, OM2 95 (2.75x16 Synergy DES), OM3 30, RCA 24m, 10d.   Depression    Diabetic gastroparesis ([place])    DKA (diabetic ketoacidoses) ([place]) 03/14/2018   GERD (gastroesophageal reflux disease)    Gout    Heart murmur    Hyperlipemia    Hypertensive heart disease    Ischemic cardiomyopathy    a. 07/2015 Echo: EF 45-50%, mod LVH, mid-apicalanteroseptal AK, Gr1 DD.   Morbid obesity ([place])    OSA on CPAP    Osteomyelitis ([place])    a. 07/2016 MTP joint of R great toe s/p transmetatarsal amputation.   Polyneuropathy in diabetes(357.2)    Pulmonary sarcoidosis ([place])    "problems w/it years ago; no problems anymore" (07/03/2014)   Rectal bleeding    Sleep apnea    Type II diabetes mellitus ([place])     Past Surgical History:   Procedure Laterality Date   AMPUTATION Right 07/24/2016   Procedure: TRANSMETATARSAL FOOT AMPUTATION;  Surgeon: [name], MD;  Location: [facility];  Service: Orthopedics;  Laterality: Right;   BALLOON DILATION N/A 03/21/2018   Procedure: BALLOON DILATION;  Surgeon: [name], MD;  Location: [name];  Service: Endoscopy;  Laterality: N/A;   BREAST LUMPECTOMY Right ~ 2000   "benign"   CARDIAC CATHETERIZATION N/A 07/30/2015   Procedure: Left Heart Cath and Coronary Angiography;  Surgeon: Burnell Blanks, MD;  Location: [facility];  Service: Cardiovascular;  Laterality: N/A;   CARDIAC CATHETERIZATION N/A 07/19/2016   Procedure: Left Heart Cath and Coronary Angiography;  Surgeon: Belva Crome, MD;  Location: [facility];  Service: Cardiovascular;  Laterality: N/A;   CARDIAC CATHETERIZATION N/A 07/29/2016   Procedure: Coronary Stent Intervention;  Surgeon: Belva Crome, MD;  Location: [facility];  Service: Cardiovascular;  Laterality: N/A;   CARDIAC CATHETERIZATION N/A 07/29/2016   Procedure: Intravascular Ultrasound/IVUS;  Surgeon: Belva Crome, MD;  Location: [facility];  Service: Cardiovascular;  Laterality: N/A;   CATARACT EXTRACTION W/ INTRAOCULAR LENS  IMPLANT, BILATERAL Bilateral 2014-2015   COLONOSCOPY WITH PROPOFOL N/A 08/22/2017   Procedure: COLONOSCOPY WITH PROPOFOL;  Surgeon: [name],  MD;  Location: [facility];  Service: Endoscopy;  Laterality: N/A;   CORONARY ANGIOPLASTY WITH STENT PLACEMENT  10/31/2011   30 % MID ATRIOVENTRICULAR GROOVE CX STENOSIS. 90 % MID RCA.PTA TO LAD/STENTING OF TANDEM 60-70%. LAD STENOSIS 99% REDUCED TO 0%.3.0 X 32MM PROMUS DES POSTDILATED TO 3.25MM.   CORONARY ANGIOPLASTY WITH STENT PLACEMENT  07/03/2014   "2"   CORONARY STENT INTERVENTION N/A 04/09/2019   Procedure: CORONARY STENT INTERVENTION;  Surgeon: Jettie Booze, MD;  Location: [place] [facility];  Service: Cardiovascular;   Laterality: N/A;   CORONARY STENT INTERVENTION N/A 08/22/2019   Procedure: CORONARY STENT INTERVENTION;  Surgeon: Martinique, Peter M, MD;  Location: [facility];  Service: Cardiovascular;  Laterality: N/A;   ESOPHAGOGASTRODUODENOSCOPY (EGD) WITH PROPOFOL N/A 03/21/2018   Procedure: ESOPHAGOGASTRODUODENOSCOPY (EGD) WITH PROPOFOL;  Surgeon: [name], MD;  Location: [facility];  Service: Endoscopy;  Laterality: N/A;   I & D EXTREMITY Right 10/30/2013   Procedure: IRRIGATION AND DEBRIDEMENT RIGHT SMALL FINGER POSSIBLE SECONDARY WOUND CLOSURE;  Surgeon: [name], MD;  Location: [facility];  Service: Orthopedics;  Laterality: Right;  [name] is available after 4pm   I & D EXTREMITY Right 11/01/2013   Procedure:  IRRIGATION AND DEBRIDEMENT RIGHT  PROXIMAL PHALANGEAL LEVEL AMPUTATION;  Surgeon: [name], MD;  Location: [facility];  Service: Orthopedics;  Laterality: Right;   INCISION AND DRAINAGE Right 10/28/2013   Procedure: INCISION AND DRAINAGE Right small finger;  Surgeon: [name], MD;  Location: [facility];  Service: Orthopedics;  Laterality: Right;   LEFT HEART CATH Left 11/03/2011   Procedure: LEFT HEART CATH;  Surgeon: [name], MD;  Location: [facility];  Service: Cardiovascular;  Laterality: Left;   LEFT HEART CATH AND CORONARY ANGIOGRAPHY N/A 04/09/2019   Procedure: LEFT HEART CATH AND CORONARY ANGIOGRAPHY;  Surgeon: Jettie Booze, MD;  Location: [facility];  Service: Cardiovascular;  Laterality: N/A;   LEFT HEART CATHETERIZATION WITH CORONARY ANGIOGRAM N/A 07/03/2014   Procedure: LEFT HEART CATHETERIZATION WITH CORONARY ANGIOGRAM;  Surgeon: [name], MD;  Location: [facility];  Service: Cardiovascular;  Laterality: N/A;   PERCUTANEOUS CORONARY STENT INTERVENTION (PCI-S) Left 11/03/2011   Procedure: PERCUTANEOUS CORONARY STENT INTERVENTION (PCI-S);  Surgeon: [name], MD;  [facility];  Service: Cardiovascular;   Laterality: Left;   RIGHT/LEFT HEART CATH AND CORONARY ANGIOGRAPHY N/A 08/22/2019   Procedure: RIGHT/LEFT HEART CATH AND CORONARY ANGIOGRAPHY;  Surgeon: Martinique, Peter M, MD;  Location: [facility] [facility];  Service: Cardiovascular;  Laterality: N/A;   TOE AMPUTATION Right ~ 2010   "2nd toe"   TOE AMPUTATION Right 2012   "3rd toe"    There were no vitals filed for this visit.   Subjective Assessment - 05/19/20 1335    Subjective  Aug 2017 had amputation of Rt toes and began having balance issues. The bottoms of my feet feel like a lump of coal when I walk, no N/T or burning but they do hurt sometimes. Lt lateral leg looses feeling but not quite numb, gets a lot of edema below knees. 7-8 falls just in the last month. About 3 weeks ago, I fell and my knee hit the wall- Rt knee- now feels like it buckles. Lt lower back has been bothering me- I feel tired and harder to breathe. If I lean over on a grocery cart I am good. Callous on Lt lateral heel hurts. Lt leg wants to  drag a little- and I have a hard time pushing off of it since the toe amputations. Prior to injury- worked a very physical job at [employer]. 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 Stopped wearing AFO because it was so difficult to walk.    How long can you walk comfortably?  almost don't want to walk- being tired, difficult to make steps    Patient Stated Goals  stop falling, walk short distances without being short of breath    Currently in Pain?  No/denies         [facility] PT Assessment - 05/19/20 0001      Assessment   Medical Diagnosis  repeated falls    Referring Provider (PT)  Deatra James, MD    Onset Date/Surgical Date  07/24/16      Precautions   Precautions  Fall      Restrictions   Weight Bearing Restrictions  No      Balance Screen   Has the patient fallen in the past 6 months  Yes    How many times?  --   too many to count   Has the patient had a decrease in activity level because of a fear of falling?   Yes    Is the  patient reluctant to leave their home because of a fear of falling?   Yes      Home Environment   Living Environment  Private residence    Living Arrangements  Spouse/significant other;Children    Additional Comments  rarely use steps, two step ups into home      Prior Function   Level of Independence  Independent    Vocation  On disability      Cognition   Overall Cognitive Status  Within Functional Limits for tasks assessed      Observation/Other Assessments   Focus on Therapeutic Outcomes (FOTO)   n/a MCD      Sensation   Additional Comments  change in sensation in plantar aspect of feet      ROM / Strength   AROM / PROM / Strength  Strength      Strength   Overall Strength Comments  bil DF 3/5, Lt knee flx 4/5      Ambulation/Gait   Gait Comments  not using AD right now      Standardized Balance Assessment   Standardized Balance Assessment  Berg Balance Test      Berg Balance Test   Sit to Stand  Able to stand without using hands and stabilize independently    Standing Unsupported  Able to stand 2 minutes with supervision    Sitting with Back Unsupported but Feet Supported on Floor or Stool  Able to sit safely and securely 2 minutes    Stand to Sit  Sits safely with minimal use of hands    Transfers  Able to transfer safely, definite need of hands    Standing Unsupported with Eyes Closed  Able to stand 3 seconds    Standing Unsupported with Feet Together  Able to place feet together independently but unable to hold for 30 seconds    From Standing, Reach Forward with Outstretched Arm  Reaches forward but needs supervision    From Standing Position, Pick up Object from Floor  Able to pick up shoe, needs supervision    From Standing Position, Turn to Look Behind Over each Shoulder  Needs assist to keep from losing balance and falling    Turn 360 Degrees  Needs assistance while turning    Standing Unsupported, Alternately Place Feet on Step/Stool  Needs assistance to keep  from falling or unable to try    Standing Unsupported, One Foot in [employer] balance while stepping or standing    Standing on One Leg  Unable to try or needs assist to prevent fall    Total Score  26                  Objective measurements completed on examination: See above findings.                PT Short Term Goals - 05/19/20 1526      PT SHORT TERM GOAL #1   Title  Pt will be independent with HEP as it has been established in the short term    Baseline  will progress as appropriate    Time  3    Period  Weeks    Status  New    Target Date  06/09/20      PT SHORT TERM GOAL #2   Title  Pt will obtain and demonstrate safe use of assistive device for improved safety in walking    Baseline  no AD at eval, discussed trekking poles    Time  3    Period  Weeks    Status  New    Target Date  06/09/20      PT SHORT TERM GOAL #3   Title  Pt will demonstrate use of knee flexion & foot placement in static stance for improved control of postural sway    Baseline  stands with feet parallel and knees locked- resulting in movement though lumbar spine for balance control.    Time  3    Period  Weeks    Status  New    Target Date  06/09/20        PT Long Term Goals - 05/19/20 1533      PT LONG TERM GOAL #1   Title  to be set at re-eval as appropriate per POC             Plan - 05/19/20 1508    Clinical Impression Statement  Pt presents to PT with complaints of LOB, fatigue/SOB, LE weakness and Lt low back strain. He feels that this all began in 2017 when all 5 digits on Rt foot were amputated. He does not currently use an AD but has fallen so many times in the last 6 months he could not count. Poor anterior/posterior balance control noted but was significantly improved with use of trekking poles.He will consider purchasing some of these. Bil DF very limited in strength and flexibility and did catch bilateral feet on the ground when walking. Noted  atrophy in Lt LE as well as increased edema in Rt ankle. Pt will benefit from skilled PT in order to address deficits and meet LTGs for safety and improved function.    Personal Factors and Comorbidities  Comorbidity 3+    Comorbidities  Rt transmetatarsal amputation, T2DM, obesity, CHF    Examination-Activity Limitations  Locomotion Level;Transfers;Bend;Sit;Carry;Squat;Stairs;Dressing;Stand;Hygiene/Grooming;Lift;Toileting    Examination-Participation Restrictions  Community Activity;Laundry;Other    Stability/Clinical Decision Making  Unstable/Unpredictable    Clinical Decision Making  High    Rehab Potential  Good    PT Frequency  --   3 visits in first auth   PT Duration  3 weeks    PT Treatment/Interventions  ADLs/Self Care Home Management;Cryotherapy;Electrical Stimulation;Gait training;Moist  Heat;Stair training;Functional mobility training;Therapeutic activities;Therapeutic exercise;Balance training;Neuromuscular re-education;Manual techniques;Patient/family education;Orthotic Fit/Training;Passive range of motion;Dry needling;Taping;Vasopneumatic Device    PT Next Visit Plan  gross LE strength- begin with proximal, did he get AD? STM to Lt low back    PT Home Exercise Plan  seated leg strength, AD for safety    Consulted and Agree with Plan of Care  Patient       Patient will benefit from skilled therapeutic intervention in order to improve the following deficits and impairments:  Abnormal gait, Decreased range of motion, Difficulty walking, Increased muscle spasms, Decreased endurance, Cardiopulmonary status limiting activity, Decreased activity tolerance, Pain, Improper body mechanics, Impaired flexibility, Decreased balance, Decreased strength, Impaired sensation  Visit Diagnosis: Repeated falls - Plan: PT plan of care cert/re-cert  Difficulty in walking, not elsewhere classified - Plan: PT plan of care cert/re-cert  Localized edema - Plan: PT plan of care cert/re-cert  Muscle  weakness (generalized) - Plan: PT plan of care cert/re-cert     Problem List Patient Active Problem List   Diagnosis Date Noted   Unstable angina ([place]) 08/17/2019   Chest pain 08/16/2019   NSTEMI (non-ST elevated myocardial infarction) ([place]) 04/06/2019   ACS (acute coronary syndrome) ([place]) 04/06/2019   Sepsis ([place]) 03/15/2018   DKA (diabetic ketoacidoses) ([place]) 03/15/2018   Special screening for malignant neoplasms, colon 08/22/2017   Acute renal failure superimposed on stage 3 chronic kidney disease ([place]) 05/19/2017   Essential hypertension    Status post transmetatarsal amputation of foot, right ([place]) 11/08/2016   Diabetic polyneuropathy associated with type 2 diabetes mellitus ([place]) 11/08/2016   Pure hypercholesterolemia    AKI (acute kidney injury) ([place])    Acute blood loss anemia    DM type 2 with diabetic peripheral neuropathy ([place])    Physical debility 08/03/2016   Chronic osteomyelitis of right foot (HCC)    Cellulitis and abscess of leg, except foot    Penetrating wound of right foot    CKD (chronic kidney disease) stage 3, GFR 30-59 ml/min (H[facility])    Unstable angina pectoris ([place]) 07/18/2016   Chronic combined systolic and diastolic congestive heart failure ([facility])    Chest pain with moderate risk for cardiac etiology 06/24/2014   CAD S/P multiple PCIs 06/24/2014   Sleep apnea- on C-pap 06/24/2014   Morbid obesity due to excess calories ([place]) 11/10/2013   Hyperglycemia 10/29/2013   Hyperlipidemia 01/12/2011   Benign essential HTN 01/12/2011   GERD 01/12/2011    Olina Melfi C. Ahliyah Nienow PT, DPT 05/19/20 3:37 PM   [facility] [address] [place], [place], [address] Phone: [phone]   Fax:  [phone]  Name: Joshua [name] MRN: [id number] Date of Birth: 1956/03/05

## 2020-05-27 ENCOUNTER — Ambulatory Visit: Payer: Medicaid Other | Admitting: Rehabilitative and Restorative Service Providers"

## 2020-05-27 ENCOUNTER — Other Ambulatory Visit: Payer: Self-pay

## 2020-05-27 DIAGNOSIS — R296 Repeated falls: Secondary | ICD-10-CM

## 2020-05-27 DIAGNOSIS — M6281 Muscle weakness (generalized): Secondary | ICD-10-CM

## 2020-05-27 DIAGNOSIS — R262 Difficulty in walking, not elsewhere classified: Secondary | ICD-10-CM

## 2020-05-27 DIAGNOSIS — R600 Localized edema: Secondary | ICD-10-CM

## 2020-05-27 NOTE — Therapy (Signed)
[place], [place], [address] Phone: [phone]   Fax:  [phone]  Physical Therapy Treatment  Patient Details  Name: Joshua [name] MRN: [id number] Date of Birth: 02/12/1956 Referring Provider (PT): Donald Prose, MD   Encounter Date: 05/27/2020   PT End of Session - 05/27/20 1620    Activity Tolerance Patient tolerated treatment well;No increased pain    Behavior During Therapy WFL for tasks assessed/performed           Past Medical History:  Diagnosis Date  . Anemia   . Chronic combined systolic and diastolic CHF (congestive heart failure) ([place])    a. 07/2015 Echo: EF 45-50%, mod LVH, mid apical/antsept AK, Gr 1 DD.  [name] CKD (chronic kidney disease), stage III   . Coronary artery disease    a. 2012 s/p MI/PCI: s/p DES to mid/dist RCA and overlapping DES to LAD;  b. 07/2015 Cath: LAD 10% ISR, D1 40(jailed), LCX 18m, OM2 30, OM3 30, RCA 50m ISR, 10d ISR; c. 07/2016 NSTEMI/PCI: LAD 85ost/p/m (3.5x20 Synergy DES overlapping prior stent), D1 40, LCX 28m, OM2 95 (2.75x16 Synergy DES), OM3 30, RCA 36m, 10d.  . Depression   . Diabetic gastroparesis ([place])   . DKA (diabetic ketoacidoses) ([place]) 03/14/2018  . GERD (gastroesophageal reflux disease)   . Gout   . Heart murmur   . Hyperlipemia   . Hypertensive heart disease   . Ischemic cardiomyopathy    a. 07/2015 Echo: EF 45-50%, mod LVH, mid-apicalanteroseptal AK, Gr1 DD.  . Morbid obesity ([place])   . OSA on CPAP   . Osteomyelitis ([place])    a. 07/2016 MTP joint of R great toe s/p transmetatarsal amputation.  . Polyneuropathy in diabetes(357.2)   . Pulmonary sarcoidosis ([place])    "problems w/it years ago; no problems anymore" (07/03/2014)  . Rectal bleeding   . Sleep apnea   . Type II diabetes mellitus ([place])     Past Surgical History:  Procedure Laterality Date  . AMPUTATION Right 07/24/2016   Procedure: TRANSMETATARSAL FOOT AMPUTATION;  Surgeon: [name], MD;  Location: [facility];  Service: Orthopedics;  Laterality: Right;  . BALLOON DILATION N/A 03/21/2018   Procedure: BALLOON DILATION;  Surgeon: [name], MD;  Location: [facility] ENDOSCOPY;  Service: Endoscopy;  Laterality: N/A;  . BREAST LUMPECTOMY Right ~ 2000   "benign"  . CARDIAC CATHETERIZATION N/A 07/30/2015   Procedure: Left Heart Cath and Coronary Angiography;  Surgeon: Burnell Blanks, MD;  Location: [facility];  Service: Cardiovascular;  Laterality: N/A;  . CARDIAC CATHETERIZATION N/A 07/19/2016   Procedure: Left Heart Cath and Coronary Angiography;  Surgeon: Belva Crome, MD;  Location: [place] [facility];  Service: Cardiovascular;  Laterality: N/A;  . CARDIAC CATHETERIZATION N/A 07/29/2016   Procedure: Coronary Stent Intervention;  Surgeon: Belva Crome, MD;  Location: [place] [facility];  Service: Cardiovascular;  Laterality: N/A;  . CARDIAC CATHETERIZATION N/A 07/29/2016   Procedure: Intravascular Ultrasound/IVUS;  Surgeon: Belva Crome, MD;  Location: [place] [facility];  Service: Cardiovascular;  Laterality: N/A;  . CATARACT EXTRACTION W/ INTRAOCULAR LENS  IMPLANT, BILATERAL Bilateral 2014-2015  . COLONOSCOPY WITH PROPOFOL N/A 08/22/2017   Procedure: COLONOSCOPY WITH PROPOFOL;  Surgeon: [name], MD;  Location: [facility];  Service: Endoscopy;  Laterality: N/A;  . CORONARY ANGIOPLASTY WITH STENT PLACEMENT  10/31/2011   30 % MID ATRIOVENTRICULAR GROOVE CX STENOSIS. 90 % MID RCA.PTA TO LAD/STENTING OF TANDEM 60-70%.  LAD STENOSIS 99% REDUCED TO 0%.3.0 X 32MM PROMUS DES POSTDILATED TO 3.25MM.  [name] CORONARY ANGIOPLASTY WITH STENT PLACEMENT  07/03/2014   "2"  . CORONARY STENT INTERVENTION N/A 04/09/2019   Procedure: CORONARY STENT INTERVENTION;  Surgeon: Jettie Booze, MD;  Location: [place] [facility];  Service: Cardiovascular;  Laterality: N/A;  . CORONARY STENT INTERVENTION N/A 08/22/2019   Procedure: CORONARY STENT INTERVENTION;  Surgeon: Martinique, Peter M,  MD;  Location: [place] [facility];  Service: Cardiovascular;  Laterality: N/A;  . ESOPHAGOGASTRODUODENOSCOPY (EGD) WITH PROPOFOL N/A 03/21/2018   Procedure: ESOPHAGOGASTRODUODENOSCOPY (EGD) WITH PROPOFOL;  Surgeon: [name], MD;  Location: [facility];  Service: Endoscopy;  Laterality: N/A;  . I & D EXTREMITY Right 10/30/2013   Procedure: IRRIGATION AND DEBRIDEMENT RIGHT SMALL FINGER POSSIBLE SECONDARY WOUND CLOSURE;  Surgeon: [name], MD;  Location: [facility];  Service: Orthopedics;  Laterality: Right;  [name] is available after 4pm  . I & D EXTREMITY Right 11/01/2013   Procedure:  IRRIGATION AND DEBRIDEMENT RIGHT  PROXIMAL PHALANGEAL LEVEL AMPUTATION;  Surgeon: [name], MD;  Location: [facility];  Service: Orthopedics;  Laterality: Right;  . INCISION AND DRAINAGE Right 10/28/2013   Procedure: INCISION AND DRAINAGE Right small finger;  Surgeon: [name], MD;  Location: [facility];  Service: Orthopedics;  Laterality: Right;  . LEFT HEART CATH Left 11/03/2011   Procedure: LEFT HEART CATH;  Surgeon: [name], MD;  Location: [facility] CATH LAB;  Service: Cardiovascular;  Laterality: Left;  . LEFT HEART CATH AND CORONARY ANGIOGRAPHY N/A 04/09/2019   Procedure: LEFT HEART CATH AND CORONARY ANGIOGRAPHY;  Surgeon: Jettie Booze, MD;  Location: [place] [facility];  Service: Cardiovascular;  Laterality: N/A;  . LEFT HEART CATHETERIZATION WITH CORONARY ANGIOGRAM N/A 07/03/2014   Procedure: LEFT HEART CATHETERIZATION WITH CORONARY ANGIOGRAM;  Surgeon: [name], MD;  Location: [facility] CATH LAB;  Service: Cardiovascular;  Laterality: N/A;  . PERCUTANEOUS CORONARY STENT INTERVENTION (PCI-S) Left 11/03/2011   Procedure: PERCUTANEOUS CORONARY STENT INTERVENTION (PCI-S);  Surgeon: [name], MD;  Location: [facility] CATH LAB;  Service: Cardiovascular;  Laterality: Left;  . RIGHT/LEFT HEART CATH AND CORONARY ANGIOGRAPHY N/A 08/22/2019   Procedure: RIGHT/LEFT HEART CATH AND CORONARY  ANGIOGRAPHY;  Surgeon: Martinique, Peter M, MD;  Location: [place] [facility];  Service: Cardiovascular;  Laterality: N/A;  . TOE AMPUTATION Right ~ 2010   "2nd toe"  . TOE AMPUTATION Right 2012   "3rd toe"    There were no vitals filed for this visit.   Subjective Assessment - 05/27/20 1502    Subjective Pt arrived to therapy with his new walking sticks. my low back isn't bad today at all but my legs feel very heavy.    How long can you sit comfortably? limited by low back pain;unsure    How long can you stand comfortably? limited by low back pain;unsure    How long can you walk comfortably? better with the walking sticks    Patient Stated Goals stop falling, walk short distances without being short of breath    Currently in Pain? No/denies   feet feel heavy   Multiple Pain Sites No                             [facility] Adult PT Treatment/Exercise - 05/27/20 0001      Exercises   Exercises Lumbar;Knee/Hip      Lumbar Exercises:  Supine   Other Supine Lumbar Exercises tilt with maximal PT verbal and tactile cues for technique x 20; tilt with clam shell x 20; tilt with shoulder depression. all required max PT verbal, tactile and visual cues to perform correctly.      Knee/Hip Exercises: Stretches   Other Knee/Hip Stretches with L sacral palpation, pt reports increased L "sensation." Quad set x 10; heel slide x 10      Knee/Hip Exercises: Supine   Other Supine Knee/Hip Exercises pelvic tilt with SLR x 10 each LE; tilt with bridge x 10 with pt reporting when he did not fully hold tilt that he had back discomfort and that he has not noticed the foot heaviness with any; tilt with march x 10 bil      Manual Therapy   Manual therapy comments PT assessed L sacral border with pt reporting leg heaviness to increase with deep palpation around border                  PT Education - 05/27/20 1619    Education Details HEP issued: tilt x 10, tilt with march x 10, tilt with  hip abdct/addct x 10; tilt with heel slide x 10; to be performed every other day unless pt has a drastic pain reduction during. PT advised pt to perform pelvic tilt at all times, even with standing talking to her.    Person(s) Educated Patient    Methods Explanation;Demonstration;Tactile cues;Verbal cues;Handout    Comprehension Verbalized understanding;Returned demonstration            PT Short Term Goals - 05/27/20 1544      PT SHORT TERM GOAL #1   Title Pt will be independent with HEP as it has been established in the short term    Baseline will progress as appropriate    Time 3    Period Weeks    Status On-going    Target Date 06/09/20      PT SHORT TERM GOAL #2   Title Pt will obtain and demonstrate safe use of assistive device for improved safety in walking    Status Achieved      PT SHORT TERM GOAL #3   Title Pt will demonstrate use of knee flexion & foot placement in static stance for improved control of postural sway    Time 3    Period Weeks    Status On-going    Target Date 06/09/20             PT Long Term Goals - 05/19/20 1533      PT LONG TERM GOAL #1   Title to be set at re-eval as appropriate per POC                 Plan - 05/27/20 1545    Clinical Impression Statement Pt presents to PT with minimal c/o L LBP but instead with reports of heaviness of bil feet. Therapy concentrated on LE/core strengthening this visit and assessment of L sacral area as related to if pain radiates to feet which it minimally does. Pt would benefit from further core/lumar/LE strengthening to assist with improved safe functional mobility. Pt obtained walking sticks from [employer] and reports using them sometimes. PT discussed with him using them more frequently for safety.Pt reports decreased foot heaviness after treatment. HEP issued.    Rehab Potential Good    PT Treatment/Interventions ADLs/Self Care Home Management;Cryotherapy;Electrical Stimulation;Gait training;Moist  Heat;Stair training;Functional mobility training;Therapeutic activities;Therapeutic exercise;Balance training;Neuromuscular re-education;Manual techniques;Patient/family  education;Orthotic Fit/Training;Passive range of motion;Dry needling;Taping;Vasopneumatic Device    PT Next Visit Plan continue strengthening lumbar/core/LE as pain tolerates; review HEP    PT Home Exercise Plan review pelvic tilt; pelvic tilt with march, tilt with hip abdct/addct, tilt with heel slide all supine    Consulted and Agree with Plan of Care Patient           Patient will benefit from skilled therapeutic intervention in order to improve the following deficits and impairments:  Abnormal gait, Decreased range of motion, Difficulty walking, Increased muscle spasms, Decreased endurance, Cardiopulmonary status limiting activity, Decreased activity tolerance, Pain, Improper body mechanics, Impaired flexibility, Decreased balance, Decreased strength, Impaired sensation  Visit Diagnosis: Repeated falls  Difficulty in walking, not elsewhere classified  Localized edema  Muscle weakness (generalized)     Problem List Patient Active Problem List   Diagnosis Date Noted  . Unstable angina ([place]) 08/17/2019  . Chest pain 08/16/2019  . NSTEMI (non-ST elevated myocardial infarction) ([place]) 04/06/2019  . ACS (acute coronary syndrome) ([place]) 04/06/2019  . Sepsis ([place]) 03/15/2018  . DKA (diabetic ketoacidoses) ([place]) 03/15/2018  . Special screening for malignant neoplasms, colon 08/22/2017  . Acute renal failure superimposed on stage 3 chronic kidney disease ([place]) 05/19/2017  . Essential hypertension   . Status post transmetatarsal amputation of foot, right ([place]) 11/08/2016  . Diabetic polyneuropathy associated with type 2 diabetes mellitus ([place]) 11/08/2016  . Pure hypercholesterolemia   . AKI (acute kidney injury) ([place])   . Acute blood loss anemia   . DM type 2 with diabetic peripheral neuropathy ([place])   . Physical  debility 08/03/2016  . Chronic osteomyelitis of right foot ([place])   . Cellulitis and abscess of leg, except foot   . Penetrating wound of right foot   . CKD (chronic kidney disease) stage 3, GFR 30-59 ml/min ([facility])   . Unstable angina pectoris ([place]) 07/18/2016  . Chronic combined systolic and diastolic congestive heart failure ([place])   . Chest pain with moderate risk for cardiac etiology 06/24/2014  . CAD S/P multiple PCIs 06/24/2014  . Sleep apnea- on C-pap 06/24/2014  . Morbid obesity due to excess calories ([place]) 11/10/2013  . Hyperglycemia 10/29/2013  . Hyperlipidemia 01/12/2011  . Benign essential HTN 01/12/2011  . GERD 01/12/2011    Myra Rude, PT 05/27/2020, 4:23 PM  [facility] [address] [place], [place], [address] Phone: [phone]   Fax:  [phone]  Name: JAIMON BUGAJ MRN: [id number] Date of Birth: 28-Aug-1956

## 2020-05-28 ENCOUNTER — Other Ambulatory Visit: Payer: Self-pay

## 2020-05-28 MED ORDER — TICAGRELOR 90 MG PO TABS: 90.0000 mg | ORAL_TABLET | Freq: Two times a day (BID) | ORAL | 3 refills | Status: DC

## 2020-05-29 ENCOUNTER — Other Ambulatory Visit: Payer: Self-pay | Admitting: Physician Assistant

## 2020-05-29 ENCOUNTER — Other Ambulatory Visit: Payer: Self-pay

## 2020-05-29 ENCOUNTER — Ambulatory Visit: Payer: Medicaid Other | Admitting: Rehabilitative and Restorative Service Providers"

## 2020-05-29 ENCOUNTER — Encounter: Payer: Self-pay | Admitting: Rehabilitative and Restorative Service Providers"

## 2020-05-29 DIAGNOSIS — R296 Repeated falls: Secondary | ICD-10-CM | POA: Diagnosis not present

## 2020-05-29 DIAGNOSIS — R072 Precordial pain: Secondary | ICD-10-CM

## 2020-05-29 DIAGNOSIS — R600 Localized edema: Secondary | ICD-10-CM

## 2020-05-29 DIAGNOSIS — M6281 Muscle weakness (generalized): Secondary | ICD-10-CM

## 2020-05-29 DIAGNOSIS — R262 Difficulty in walking, not elsewhere classified: Secondary | ICD-10-CM

## 2020-05-29 NOTE — Therapy (Signed)
[place], [place], [address] Phone: [phone]   Fax:  [phone]  Physical Therapy Treatment  Patient Details  Name: Joshua [name] MRN: [id number] Date of Birth: [birth year] Referring Provider (PT): Donald Prose, MD   Encounter Date: 05/29/2020   PT End of Session - 05/29/20 0856    Visit Number 3    Authorization Type MCD- Josem Kaufmann submitted 6/7    PT Start Time 0848    PT Stop Time 0930    PT Time Calculation (min) 42 min           Past Medical History:  Diagnosis Date  . Anemia   . Chronic combined systolic and diastolic CHF (congestive heart failure) ([place])    a. 07/2015 Echo: EF 45-50%, mod LVH, mid apical/antsept AK, Gr 1 DD.  [name] CKD (chronic kidney disease), stage III   . Coronary artery disease    a. 2012 s/p MI/PCI: s/p DES to mid/dist RCA and overlapping DES to LAD;  b. 07/2015 Cath: LAD 10% ISR, D1 40(jailed), LCX 26m, OM2 30, OM3 30, RCA 66m ISR, 10d ISR; c. 07/2016 NSTEMI/PCI: LAD 85ost/p/m (3.5x20 Synergy DES overlapping prior stent), D1 40, LCX 42m, OM2 95 (2.75x16 Synergy DES), OM3 30, RCA 77m, 10d.  . Depression   . Diabetic gastroparesis ([place])   . DKA (diabetic ketoacidoses) ([place]) 03/14/2018  . GERD (gastroesophageal reflux disease)   . Gout   . Heart murmur   . Hyperlipemia   . Hypertensive heart disease   . Ischemic cardiomyopathy    a. 07/2015 Echo: EF 45-50%, mod LVH, mid-apicalanteroseptal AK, Gr1 DD.  . Morbid obesity ([place])   . OSA on CPAP   . Osteomyelitis ([place])    a. 07/2016 MTP joint of R great toe s/p transmetatarsal amputation.  . Polyneuropathy in diabetes(357.2)   . Pulmonary sarcoidosis ([place])    "problems w/it years ago; no problems anymore" (07/03/2014)  . Rectal bleeding   . Sleep apnea   . Type II diabetes mellitus ([place])     Past Surgical History:  Procedure Laterality Date  . AMPUTATION Right 07/24/2016   Procedure: TRANSMETATARSAL FOOT AMPUTATION;  Surgeon:  [name], MD;  Location: [facility];  Service: Orthopedics;  Laterality: Right;  . BALLOON DILATION N/A 03/21/2018   Procedure: BALLOON DILATION;  Surgeon: [name], MD;  Location: [facility] ENDOSCOPY;  Service: Endoscopy;  Laterality: N/A;  . BREAST LUMPECTOMY Right ~ 2000   "benign"  . CARDIAC CATHETERIZATION N/A 07/30/2015   Procedure: Left Heart Cath and Coronary Angiography;  Surgeon: Burnell Blanks, MD;  Location: [place] CV LAB;  Service: Cardiovascular;  Laterality: N/A;  . CARDIAC CATHETERIZATION N/A 07/19/2016   Procedure: Left Heart Cath and Coronary Angiography;  Surgeon: Belva Crome, MD;  Location: [facility];  Service: Cardiovascular;  Laterality: N/A;  . CARDIAC CATHETERIZATION N/A 07/29/2016   Procedure: Coronary Stent Intervention;  Surgeon: Belva Crome, MD;  Location: [facility] LAB;  Service: Cardiovascular;  Laterality: N/A;  . CARDIAC CATHETERIZATION N/A 07/29/2016   Procedure: Intravascular Ultrasound/IVUS;  Surgeon: Belva Crome, MD;  Location: [place] [facility];  Service: Cardiovascular;  Laterality: N/A;  . CATARACT EXTRACTION W/ INTRAOCULAR LENS  IMPLANT, BILATERAL Bilateral 2014-2015  . COLONOSCOPY WITH PROPOFOL N/A 08/22/2017   Procedure: COLONOSCOPY WITH PROPOFOL;  Surgeon: [name], MD;  Location: [facility];  Service: Endoscopy;  Laterality: N/A;  . CORONARY ANGIOPLASTY WITH STENT PLACEMENT  10/31/2011  30 % MID ATRIOVENTRICULAR GROOVE CX STENOSIS. 90 % MID RCA.PTA TO LAD/STENTING OF TANDEM 60-70%. LAD STENOSIS 99% REDUCED TO 0%.3.0 X 32MM PROMUS DES POSTDILATED TO 3.25MM.  [name] CORONARY ANGIOPLASTY WITH STENT PLACEMENT  07/03/2014   "2"  . CORONARY STENT INTERVENTION N/A 04/09/2019   Procedure: CORONARY STENT INTERVENTION;  Surgeon: Jettie Booze, MD;  Location: [facility] LAB;  Service: Cardiovascular;  Laterality: N/A;  . CORONARY STENT INTERVENTION N/A 08/22/2019   Procedure: CORONARY STENT INTERVENTION;  Surgeon: Martinique,  Peter M, MD;  Location: [facility] CV LAB;  Service: Cardiovascular;  Laterality: N/A;  . ESOPHAGOGASTRODUODENOSCOPY (EGD) WITH PROPOFOL N/A 03/21/2018   Procedure: ESOPHAGOGASTRODUODENOSCOPY (EGD) WITH PROPOFOL;  Surgeon: [name], MD;  Location: [facility];  Service: Endoscopy;  Laterality: N/A;  . I & D EXTREMITY Right 10/30/2013   Procedure: IRRIGATION AND DEBRIDEMENT RIGHT SMALL FINGER POSSIBLE SECONDARY WOUND CLOSURE;  Surgeon: [name], MD;  Location: [facility];  Service: Orthopedics;  Laterality: Right;  [name] is available after 4pm  . I & D EXTREMITY Right 11/01/2013   Procedure:  IRRIGATION AND DEBRIDEMENT RIGHT  PROXIMAL PHALANGEAL LEVEL AMPUTATION;  Surgeon: [name], MD;  Location: [facility];  Service: Orthopedics;  Laterality: Right;  . INCISION AND DRAINAGE Right 10/28/2013   Procedure: INCISION AND DRAINAGE Right small finger;  Surgeon: [name], MD;  Location: [facility];  Service: Orthopedics;  Laterality: Right;  . LEFT HEART CATH Left 11/03/2011   Procedure: LEFT HEART CATH;  Surgeon: [name], MD;  Location: [facility] C[facility] LAB;  Service: Cardiovascular;  Laterality: Left;  . LEFT HEART CATH AND CORONARY ANGIOGRAPHY N/A 04/09/2019   Procedure: LEFT HEART CATH AND CORONARY ANGIOGRAPHY;  Surgeon: Jettie Booze, MD;  Location: [place] CV LAB;  Service: Cardiovascular;  Laterality: N/A;  . LEFT HEART CATHETERIZATION WITH CORONARY ANGIOGRAM N/A 07/03/2014   Procedure: LEFT HEART CATHETERIZATION WITH CORONARY ANGIOGRAM;  Surgeon: [name], MD;  Location: [facility] CATH LAB;  Service: Cardiovascular;  Laterality: N/A;  . PERCUTANEOUS CORONARY STENT INTERVENTION (PCI-S) Left 11/03/2011   Procedure: PERCUTANEOUS CORONARY STENT INTERVENTION (PCI-S);  Surgeon: [name], MD;  Location: [facility] CATH LAB;  Service: Cardiovascular;  Laterality: Left;  . RIGHT/LEFT HEART CATH AND CORONARY ANGIOGRAPHY N/A 08/22/2019   Procedure: RIGHT/LEFT HEART CATH AND  CORONARY ANGIOGRAPHY;  Surgeon: Martinique, Peter M, MD;  Location: [place] CV LAB;  Service: Cardiovascular;  Laterality: N/A;  . TOE AMPUTATION Right ~ 2010   "2nd toe"  . TOE AMPUTATION Right 2012   "3rd toe"    There were no vitals filed for this visit.   Subjective Assessment - 05/29/20 0853    Subjective minimal pain. didn't do exercises last night    How long can you sit comfortably? limited by low back pain;unsure    How long can you stand comfortably? limited by low back pain;unsure    How long can you walk comfortably? better with the walking sticks    Patient Stated Goals stop falling, walk short distances without being short of breath    Currently in Pain? Yes    Pain Score 1     Pain Location Foot    Pain Onset More than a month ago    Pain Frequency Intermittent    Multiple Pain Sites No  [place] Adult PT Treatment/Exercise - 05/29/20 0001      Lumbar Exercises: Seated   Other Seated Lumbar Exercises seated tilt sititng EOB with feet flat on floor x 10; tilt in same position with forward reaches x 15      Lumbar Exercises: Supine   Other Supine Lumbar Exercises Reviewed HEP: tilt x 15, tilt with heel slide x 15 bil, tilt with hip abdct/addt combo x 20, tilt with march x 20; tilt with ball squeeze x 20; tilt with arm reach toward knees without head lifting x 20; tilt with trunk rot x 10; tilt with shoulder flex/ext x 15 holding small ball; tilt with 2 lb chest press with weight around yardstick x 20                    PT Short Term Goals - 05/27/20 1544      PT SHORT TERM GOAL #1   Title Pt will be independent with HEP as it has been established in the short term    Baseline will progress as appropriate    Time 3    Period Weeks    Status On-going    Target Date 06/09/20      PT SHORT TERM GOAL #2   Title Pt will obtain and demonstrate safe use of assistive device for improved safety in walking    Status  Achieved      PT SHORT TERM GOAL #3   Title Pt will demonstrate use of knee flexion & foot placement in static stance for improved control of postural sway    Time 3    Period Weeks    Status On-going    Target Date 06/09/20             PT Long Term Goals - 05/19/20 1533      PT LONG TERM GOAL #1   Title to be set at re-eval as appropriate per POC                 Plan - 05/29/20 0856    Clinical Impression Statement Pt presents to PT with reduction in L LBP and bil feet heaviness after last treatment. Therapy continued to concentrate on LE/core strengthening with improvement in pain radiation. Pt would continue to benefit from further core/lumbar/LE strengthening and LBP management to assist with improved safe fxnal mobility with reduction in L LBP and leg heaviness. Pt needs maximal verbal cueing for resetting pelvic tilt due to not having awareness when he loses contraction. PT can tell he is maintaining tilt better in sitting.    Rehab Potential Good    PT Frequency 2x / week    PT Duration 3 weeks    PT Treatment/Interventions ADLs/Self Care Home Management;Cryotherapy;Electrical Stimulation;Gait training;Moist Heat;Stair training;Functional mobility training;Therapeutic activities;Therapeutic exercise;Balance training;Neuromuscular re-education;Manual techniques;Patient/family education;Orthotic Fit/Training;Passive range of motion;Dry needling;Taping;Vasopneumatic Device    PT Next Visit Plan continue strengthening lumbar/core/LE as pain tolerates    Consulted and Agree with Plan of Care Patient           Patient will benefit from skilled therapeutic intervention in order to improve the following deficits and impairments:  Abnormal gait, Decreased range of motion, Difficulty walking, Increased muscle spasms, Decreased endurance, Cardiopulmonary status limiting activity, Decreased activity tolerance, Pain, Improper body mechanics, Impaired flexibility, Decreased balance,  Decreased strength, Impaired sensation  Visit Diagnosis: Repeated falls  Difficulty in walking, not elsewhere classified  Localized edema  Muscle weakness (generalized)     Problem List  Patient Active Problem List   Diagnosis Date Noted  . Unstable angina ([place]) 08/17/2019  . Chest pain 08/16/2019  . NSTEMI (non-ST elevated myocardial infarction) ([place]) 04/06/2019  . ACS (acute coronary syndrome) ([place]) 04/06/2019  . Sepsis ([place]) 03/15/2018  . DKA (diabetic ketoacidoses) ([place]) 03/15/2018  . Special screening for malignant neoplasms, colon 08/22/2017  . Acute renal failure superimposed on stage 3 chronic kidney disease ([place]) 05/19/2017  . Essential hypertension   . Status post transmetatarsal amputation of foot, right ([place]) 11/08/2016  . Diabetic polyneuropathy associated with type 2 diabetes mellitus ([place]) 11/08/2016  . Pure hypercholesterolemia   . AKI (acute kidney injury) ([place])   . Acute blood loss anemia   . DM type 2 with diabetic peripheral neuropathy ([place])   . Physical debility 08/03/2016  . Chronic osteomyelitis of right foot ([place])   . Cellulitis and abscess of leg, except foot   . Penetrating wound of right foot   . CKD (chronic kidney disease) stage 3, GFR 30-59 ml/min (H[facility])   . Unstable angina pectoris ([place]) 07/18/2016  . Chronic combined systolic and diastolic congestive heart failure ([place])   . Chest pain with moderate risk for cardiac etiology 06/24/2014  . CAD S/P multiple PCIs 06/24/2014  . Sleep apnea- on C-pap 06/24/2014  . Morbid obesity due to excess calories ([place]) 11/10/2013  . Hyperglycemia 10/29/2013  . Hyperlipidemia 01/12/2011  . Benign essential HTN 01/12/2011  . GERD 01/12/2011    Myra Rude, PT 05/29/2020, 9:34 AM  [facility] [address] [place], [place], [address] Phone: [phone]   Fax:  [phone]  Name: Joshua [name] MRN: [id number] Date of Birth: 12-15-1955

## 2020-06-03 ENCOUNTER — Encounter: Payer: Medicaid Other | Attending: Internal Medicine | Admitting: Dietician

## 2020-06-03 ENCOUNTER — Other Ambulatory Visit: Payer: Self-pay

## 2020-06-03 DIAGNOSIS — E1142 Type 2 diabetes mellitus with diabetic polyneuropathy: Secondary | ICD-10-CM | POA: Diagnosis not present

## 2020-06-03 DIAGNOSIS — N1831 Chronic kidney disease, stage 3a: Secondary | ICD-10-CM

## 2020-06-03 NOTE — Patient Instructions (Addendum)
Make an eye appointment. Drink water rather than soda and recommend limit beer to 2 or less daily. Avoid added salt and continue to avoid processed meat Add more vegetables when able and balance your meals. Aim to take your Novolog before your meal.
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
## 2020-06-04 ENCOUNTER — Other Ambulatory Visit: Payer: Self-pay

## 2020-06-04 ENCOUNTER — Ambulatory Visit: Payer: Medicaid Other | Admitting: Rehabilitative and Restorative Service Providers"

## 2020-06-04 ENCOUNTER — Encounter: Payer: Self-pay | Admitting: Rehabilitative and Restorative Service Providers"

## 2020-06-04 DIAGNOSIS — R296 Repeated falls: Secondary | ICD-10-CM | POA: Diagnosis not present

## 2020-06-04 DIAGNOSIS — R262 Difficulty in walking, not elsewhere classified: Secondary | ICD-10-CM

## 2020-06-04 DIAGNOSIS — M6281 Muscle weakness (generalized): Secondary | ICD-10-CM

## 2020-06-04 DIAGNOSIS — R600 Localized edema: Secondary | ICD-10-CM

## 2020-06-04 NOTE — Therapy (Addendum)
[place], [place], [address] Phone: [phone]   Fax:  [phone]  Physical Therapy Treatment/Discharge  Patient Details  Name: YOSIEL THIEME MRN: [id number] Date of Birth: 05/22/1956 Referring Provider (PT): Donald Prose, MD   Encounter Date: 06/04/2020   PT End of Session - 06/04/20 1551    Visit Number 4    Authorization Type MCD- auth submitted 6/7    PT Start Time 0334    PT Stop Time 0415    PT Time Calculation (min) 41 min    Activity Tolerance Patient tolerated treatment well;No increased pain    Behavior During Therapy WFL for tasks assessed/performed           Past Medical History:  Diagnosis Date  . Anemia   . Chronic combined systolic and diastolic CHF (congestive heart failure) ([place])    a. 07/2015 Echo: EF 45-50%, mod LVH, mid apical/antsept AK, Gr 1 DD.  [name] CKD (chronic kidney disease), stage III   . Coronary artery disease    a. 2012 s/p MI/PCI: s/p DES to mid/dist RCA and overlapping DES to LAD;  b. 07/2015 Cath: LAD 10% ISR, D1 40(jailed), LCX 67m OM2 30, OM3 30, RCA 58mSR, 10d ISR; c. 07/2016 NSTEMI/PCI: LAD 85ost/p/m (3.5x20 Synergy DES overlapping prior stent), D1 40, LCX 7518mM2 95 (2.75x16 Synergy DES), OM3 30, RCA 51m56md.  . Depression   . Diabetic gastroparesis (HCC)[place]. DKA (diabetic ketoacidoses) (HCC)[place]/01/2018  . GERD (gastroesophageal reflux disease)   . Gout   . Heart murmur   . Hyperlipemia   . Hypertensive heart disease   . Ischemic cardiomyopathy    a. 07/2015 Echo: EF 45-50%, mod LVH, mid-apicalanteroseptal AK, Gr1 DD.  . Morbid obesity (HCC)[place]. OSA on CPAP   . Osteomyelitis (HCC)[place] a. 07/2016 MTP joint of R great toe s/p transmetatarsal amputation.  . Polyneuropathy in diabetes(357.2)   . Pulmonary sarcoidosis (HCC)[place] "problems w/it years ago; no problems anymore" (07/03/2014)  . Rectal bleeding   . Sleep apnea   . Type II diabetes mellitus (HCC)[place]   Past Surgical History:  Procedure Laterality Date  . AMPUTATION Right 07/24/2016   Procedure: TRANSMETATARSAL FOOT AMPUTATION;  Surgeon: MarcNewt Minion;  [facility];  Laterality: Right;  . BALLOON DILATION N/A 03/21/2018   Procedure: BALLOON DILATION;  Surgeon: BuccRonald Lobo;  [facility];  Service: Endoscopy;  Laterality: N/A;  . BREAST LUMPECTOMY Right ~ 2000   "benign"  . CARDIAC CATHETERIZATION N/A 07/30/2015   Procedure: Left Heart Cath and Coronary Angiography;  Surgeon: ChriBurnell Blanks;  [facility];  Service: Cardiovascular;  Laterality: N/A;  . CARDIAC CATHETERIZATION N/A 07/19/2016   Procedure: Left Heart Cath and Coronary Angiography;  Surgeon: HenrBelva Crome;  [facility];  Service: Cardiovascular;  Laterality: N/A;  . CARDIAC CATHETERIZATION N/A 07/29/2016   Procedure: Coronary Stent Intervention;  Surgeon: HenrBelva Crome;  [facility];  Service: Cardiovascular;  Laterality: N/A;  . CARDIAC CATHETERIZATION N/A 07/29/2016   Procedure: Intravascular Ultrasound/IVUS;  Surgeon: HenrBelva Crome;  [facility];  Service: Cardiovascular;  Laterality: N/A;  . CATARACT EXTRACTION W/ INTRAOCULAR LENS  IMPLANT, BILATERAL Bilateral 2014-2015  . COLONOSCOPY WITH PROPOFOL N/A 08/22/2017   Procedure: COLONOSCOPY WITH PROPOFOL;  Surgeon: SchoWilford Corner  MD;  Location: [facility] ENDOSCOPY;  Service: Endoscopy;  Laterality: N/A;  . CORONARY ANGIOPLASTY WITH STENT PLACEMENT  10/31/2011   30 % MID ATRIOVENTRICULAR GROOVE CX STENOSIS. 90 % MID RCA.PTA TO LAD/STENTING OF TANDEM 60-70%. LAD STENOSIS 99% REDUCED TO 0%.3.0 X 32MM PROMUS DES POSTDILATED TO 3.25MM.  [name] CORONARY ANGIOPLASTY WITH STENT PLACEMENT  07/03/2014   "2"  . CORONARY STENT INTERVENTION N/A 04/09/2019   Procedure: CORONARY STENT INTERVENTION;  Surgeon: Jettie Booze, MD;  Location: [facility] CV LAB;   Service: Cardiovascular;  Laterality: N/A;  . CORONARY STENT INTERVENTION N/A 08/22/2019   Procedure: CORONARY STENT INTERVENTION;  Surgeon: Martinique, Peter M, MD;  Location: [facility] CV LAB;  Service: Cardiovascular;  Laterality: N/A;  . ESOPHAGOGASTRODUODENOSCOPY (EGD) WITH PROPOFOL N/A 03/21/2018   Procedure: ESOPHAGOGASTRODUODENOSCOPY (EGD) WITH PROPOFOL;  Surgeon: [name], MD;  Location: [facility];  Service: Endoscopy;  Laterality: N/A;  . I & D EXTREMITY Right 10/30/2013   Procedure: IRRIGATION AND DEBRIDEMENT RIGHT SMALL FINGER POSSIBLE SECONDARY WOUND CLOSURE;  Surgeon: [name], MD;  Location: [facility];  Service: Orthopedics;  Laterality: Right;  [name] is available after 4pm  . I & D EXTREMITY Right 11/01/2013   Procedure:  IRRIGATION AND DEBRIDEMENT RIGHT  PROXIMAL PHALANGEAL LEVEL AMPUTATION;  Surgeon: [name], MD;  Location: [facility];  Service: Orthopedics;  Laterality: Right;  . INCISION AND DRAINAGE Right 10/28/2013   Procedure: INCISION AND DRAINAGE Right small finger;  Surgeon: [name], MD;  [facility];  Service: Orthopedics;  Laterality: Right;  . LEFT HEART CATH Left 11/03/2011   Procedure: LEFT HEART CATH;  Surgeon: [name], MD;  Location: [facility] CATH LAB;  Service: Cardiovascular;  Laterality: Left;  . LEFT HEART CATH AND CORONARY ANGIOGRAPHY N/A 04/09/2019   Procedure: LEFT HEART CATH AND CORONARY ANGIOGRAPHY;  Surgeon: Jettie Booze, MD;  Location: [facility] CV LAB;  Service: Cardiovascular;  Laterality: N/A;  . LEFT HEART CATHETERIZATION WITH CORONARY ANGIOGRAM N/A 07/03/2014   Procedure: LEFT HEART CATHETERIZATION WITH CORONARY ANGIOGRAM;  Surgeon: [name], MD;  Location: [facility] CATH LAB;  Service: Cardiovascular;  Laterality: N/A;  . PERCUTANEOUS CORONARY STENT INTERVENTION (PCI-S) Left 11/03/2011   Procedure: PERCUTANEOUS CORONARY STENT INTERVENTION (PCI-S);  Surgeon: [name], MD;  Location: [facility] CATH LAB;   Service: Cardiovascular;  Laterality: Left;  . RIGHT/LEFT HEART CATH AND CORONARY ANGIOGRAPHY N/A 08/22/2019   Procedure: RIGHT/LEFT HEART CATH AND CORONARY ANGIOGRAPHY;  Surgeon: Martinique, Peter M, MD;  Location: [facility] CV LAB;  Service: Cardiovascular;  Laterality: N/A;  . TOE AMPUTATION Right ~ 2010   "2nd toe"  . TOE AMPUTATION Right 2012   "3rd toe"    There were no vitals filed for this visit.   Subjective Assessment - 06/04/20 1540    Subjective bottom of my feet a little better; not quite as heavy. Doing good. R knee is swollen; PT advised pt to get in touch with PCP since he has a history of gout and that he can also ice 10 min 2-3x/day to see if it helps. He reports no mechanism of injury to it.    How long can you sit comfortably? limited by low back pain;unsure    How long can you stand comfortably? limited by low back pain;unsure    How long can you walk comfortably? better with the walking sticks    Patient Stated Goals stop falling, walk short distances without being short of breath  Currently in Pain? No/denies    Multiple Pain Sites No                             [facility] Adult PT Treatment/Exercise - 06/04/20 0001      Lumbar Exercises: Standing   Other Standing Lumbar Exercises wall plank on elbow 5x30 sec; standing tilt with unilat glute set with hip ext x 20      Lumbar Exercises: Seated   Other Seated Lumbar Exercises seated tilt x 15; tilt with forward trunk hinge with glute set x 15; tilt with all around the world x 10 each direction       Lumbar Exercises: Supine   Other Supine Lumbar Exercises pelvic tilt with maximal PT verbal and tactile cues for technique performed x multiple bouts; tilt with march x 20; tilt with SLR x 17; LTR x 3 x 5 sec hold; single knee to chest 2x15 sec each LE                    PT Short Term Goals - 06/04/20 1552      PT SHORT TERM GOAL #1   Title Pt will be independent with HEP as it has been  established in the short term    Time 3    Period Weeks    Status On-going    Target Date 06/09/20      PT SHORT TERM GOAL #2   Title Pt will obtain and demonstrate safe use of assistive device for improved safety in walking    Time 3    Period Weeks    Status Achieved      PT SHORT TERM GOAL #3   Title Pt will demonstrate use of knee flexion & foot placement in static stance for improved control of postural sway    Time 3    Period Weeks    Status Achieved             PT Long Term Goals - 05/19/20 1533      PT LONG TERM GOAL #1   Title to be set at re-eval as appropriate per POC                 Plan - 06/04/20 1553    Clinical Impression Statement Pt presents to PT with weak lumbar/core weakness and improvement in L LBP and bil foot heaviness. [place] to work on Designer, multimedia to assist with back pain management and bil foot heaviness. Pt is doing well with reciprocity of walking sticks.    Rehab Potential Good    PT Frequency 2x / week    PT Duration 3 weeks    PT Treatment/Interventions ADLs/Self Care Home Management;Cryotherapy;Electrical Stimulation;Gait training;Moist Heat;Stair training;Functional mobility training;Therapeutic activities;Therapeutic exercise;Balance training;Neuromuscular re-education;Manual techniques;Patient/family education;Orthotic Fit/Training;Passive range of motion;Dry needling;Taping;Vasopneumatic Device    PT Next Visit Plan continue strengthening lumbar/core/LE as pain tolerates    PT Home Exercise Plan pelvic tilt; pelvic tilt with march, tilt with hip abdct/addct, tilt with heel slide all supine    Consulted and Agree with Plan of Care Patient           Patient will benefit from skilled therapeutic intervention in order to improve the following deficits and impairments:  Abnormal gait, Decreased range of motion, Difficulty walking, Increased muscle spasms, Decreased endurance, Cardiopulmonary status limiting activity,  Decreased activity tolerance, Pain, Improper body mechanics, Impaired flexibility, Decreased balance, Decreased strength, Impaired sensation  Visit Diagnosis: Repeated falls  Difficulty in walking, not elsewhere classified  Muscle weakness (generalized)  Localized edema     Problem List Patient Active Problem List   Diagnosis Date Noted  . Unstable angina ([place]) 08/17/2019  . Chest pain 08/16/2019  . NSTEMI (non-ST elevated myocardial infarction) ([place]) 04/06/2019  . ACS (acute coronary syndrome) ([place]) 04/06/2019  . Sepsis ([place]) 03/15/2018  . DKA (diabetic ketoacidoses) ([place]) 03/15/2018  . Special screening for malignant neoplasms, colon 08/22/2017  . Acute renal failure superimposed on stage 3 chronic kidney disease ([place]) 05/19/2017  . Essential hypertension   . Status post transmetatarsal amputation of foot, right ([place]) 11/08/2016  . Diabetic polyneuropathy associated with type 2 diabetes mellitus ([place]) 11/08/2016  . Pure hypercholesterolemia   . AKI (acute kidney injury) ([place])   . Acute blood loss anemia   . DM type 2 with diabetic peripheral neuropathy ([place])   . Physical debility 08/03/2016  . Chronic osteomyelitis of right foot ([place])   . Cellulitis and abscess of leg, except foot   . Penetrating wound of right foot   . CKD (chronic kidney disease) stage 3, GFR 30-59 ml/min (HCC)   . Unstable angina pectoris ([place]) 07/18/2016  . Chronic combined systolic and diastolic congestive heart failure ([place])   . Chest pain with moderate risk for cardiac etiology 06/24/2014  . CAD S/P multiple PCIs 06/24/2014  . Sleep apnea- on C-pap 06/24/2014  . Morbid obesity due to excess calories ([place]) 11/10/2013  . Hyperglycemia 10/29/2013  . Hyperlipidemia 01/12/2011  . Benign essential HTN 01/12/2011  . GERD 01/12/2011    Myra Rude, PT 06/04/2020, 4:17 PM  [facility] [address] [place], [place], [address] Phone: [phone]    Fax:  [phone]  Name: BRAEDAN MEUTH MRN: [id number] Date of Birth: March 01, 1956  PHYSICAL THERAPY DISCHARGE SUMMARY  Visits from Start of Care: 4  Current functional level related to goals / functional outcomes: See above   Remaining deficits: See above   Education / Equipment: Anatomy of condition, POC HEP, exercise form/rationale  Plan: Patient agrees to discharge.  Patient goals were partially met. Patient is being discharged due to not returning since the last visit.  ?????     Jessica C. Hightower PT, DPT 06/26/20 10:52 AM

## 2020-06-06 ENCOUNTER — Telehealth: Payer: Self-pay | Admitting: Internal Medicine

## 2020-06-06 ENCOUNTER — Other Ambulatory Visit: Payer: Self-pay

## 2020-06-06 MED ORDER — NOVOLOG FLEXPEN 100 UNIT/ML ~~LOC~~ SOPN: 10.0000 [IU] | PEN_INJECTOR | Freq: Three times a day (TID) | SUBCUTANEOUS | 11 refills | Status: DC

## 2020-06-06 MED ORDER — INSULIN ASPART 100 UNIT/ML ~~LOC~~ SOLN: 10.0000 [IU] | Freq: Two times a day (BID) | SUBCUTANEOUS | 3 refills | Status: DC

## 2020-06-06 NOTE — Telephone Encounter (Signed)
Sent to me by mistake 

## 2020-06-06 NOTE — Telephone Encounter (Signed)
Kayla with [facility] on [address]. called to requests the following RX be sent asap (patient is out of medication) on behalf of patient:  Medication Refill Request  Did you call your pharmacy and request this refill first? Patient went to [facility] request . If patient has not contacted pharmacy first, instruct them to do so for future refills.  . Remind them that contacting the pharmacy for their refill is the quickest method to get the refill.  . Refill policy also stated that it will take anywhere between 24-72 hours to receive the refill.    Name of medication?  insulin aspart (NOVOLOG) 100 UNIT/ML injection   Is this a 90 day supply? Yes  Name and location of pharmacy? [place], [place] Phone:  [phone]  Fax:  [phone]      . Is the request for diabetes test strips? No . If yes, what brand? N/A

## 2020-06-06 NOTE — Telephone Encounter (Signed)
RX sent

## 2020-06-09 ENCOUNTER — Other Ambulatory Visit: Payer: Self-pay

## 2020-06-09 ENCOUNTER — Ambulatory Visit: Payer: Medicaid Other | Admitting: Neurology

## 2020-06-09 ENCOUNTER — Telehealth: Payer: Self-pay | Admitting: Neurology

## 2020-06-09 ENCOUNTER — Encounter: Payer: Self-pay | Admitting: Neurology

## 2020-06-09 VITALS — BP 150/87 | HR 87 | Ht 69.0 in | Wt 300.3 lb

## 2020-06-09 DIAGNOSIS — E1165 Type 2 diabetes mellitus with hyperglycemia: Secondary | ICD-10-CM

## 2020-06-09 DIAGNOSIS — G8929 Other chronic pain: Secondary | ICD-10-CM

## 2020-06-09 DIAGNOSIS — M25561 Pain in right knee: Secondary | ICD-10-CM | POA: Diagnosis not present

## 2020-06-09 DIAGNOSIS — R2689 Other abnormalities of gait and mobility: Secondary | ICD-10-CM

## 2020-06-09 DIAGNOSIS — M545 Low back pain: Secondary | ICD-10-CM

## 2020-06-09 DIAGNOSIS — IMO0002 Reserved for concepts with insufficient information to code with codable children: Secondary | ICD-10-CM

## 2020-06-09 DIAGNOSIS — E1122 Type 2 diabetes mellitus with diabetic chronic kidney disease: Secondary | ICD-10-CM

## 2020-06-09 DIAGNOSIS — G629 Polyneuropathy, unspecified: Secondary | ICD-10-CM | POA: Diagnosis not present

## 2020-06-09 DIAGNOSIS — R296 Repeated falls: Secondary | ICD-10-CM

## 2020-06-09 NOTE — Progress Notes (Signed)
Subjective:    Patient ID: Joshua [name] is a 64 y.o. male.  HPI     Star Age, MD, PhD [facility] [address] [place], [place] [address]  Dear [name],   I saw your patient, Joshua [name], upon your kind request, in my Neurologic clinic today for initial consultation of gait disorder and concern for neuropathy. The patient is unaccompanied today. As you know, Joshua [name] is a 64 year old right-handed gentleman with an underlying medical history of hypertension, type 2 diabetes, gout, right foot osteomyelitis with s/p amputation R forefoot in 07/2016, coronary artery disease, chronic kidney disease, and associated anemia and hyper parathyroidism, CHF, OSA on CPAP, and morbid obesity with a BMI of over 45, who reports problems with his gait and balance and recurrent falls for the past several months.  He denies any burning sensation in his feet but has been told that he likely has neuropathy.  I reviewed your office note from 06/01/2020. His BUN was 24, creatinine 2.92 at the time. He is followed by endocrinology. He has had uncontrolled diabetes, A1c was over 15 in April 2020, more recently it was 8.9 on 05/15/20. He is followed by endocrinology.  He had physical therapy for about 5 sessions and was given walking poles.  He does not like to use them.  He does not have a walker.  He has chronic low back pain on the left side, without radiation to the leg.  He feels overall weaker in his feet.  He had transmetatarsal amputation of the right foot in 2017.  He had adapted to walking after surgery fairly well but in the past few months he has had balance issues and gait insecurity, recurrent falls.  He fell and hurt his right knee.  He has had right shoulder pain and has received injections with cortisone twice into the right shoulder.  He has not seen orthopedics for his right knee.  He reports swelling and pain in the right knee.  He tries to hydrate well  with water, drinks diet tea, several servings per day and drinks alcohol several servings per week in the form of beer, rare liquor.  He is a non-smoker.  He denies any stinging pain but has dull achy pain in the feet at night, not every night.  He does not notice any actual numbness.  He has a strong family history of diabetes and had an uncle with neuropathy as he recalls.  His Past Medical History Is Significant For: Past Medical History:  Diagnosis Date  . Anemia   . Chronic combined systolic and diastolic CHF (congestive heart failure) ([place])    a. 07/2015 Echo: EF 45-50%, mod LVH, mid apical/antsept AK, Gr 1 DD.  [name] CKD (chronic kidney disease), stage III   . Coronary artery disease    a. 2012 s/p MI/PCI: s/p DES to mid/dist RCA and overlapping DES to LAD;  b. 07/2015 Cath: LAD 10% ISR, D1 40(jailed), LCX 40m OM2 30, OM3 30, RCA 564mSR, 10d ISR; c. 07/2016 NSTEMI/PCI: LAD 85ost/p/m (3.5x20 Synergy DES overlapping prior stent), D1 40, LCX 7523mM2 95 (2.75x16 Synergy DES), OM3 30, RCA 72m41md.  . Depression   . Diabetic gastroparesis (HCC)[place]. DKA (diabetic ketoacidoses) (HCC)[place]/01/2018  . GERD (gastroesophageal reflux disease)   . Gout   . Heart murmur   . Hyperlipemia   . Hypertensive heart disease   . Ischemic cardiomyopathy    a. 07/2015  Echo: EF 45-50%, mod LVH, mid-apicalanteroseptal AK, Gr1 DD.  . Morbid obesity ([place])   . OSA on CPAP   . Osteomyelitis ([place])    a. 07/2016 MTP joint of R great toe s/p transmetatarsal amputation.  . Polyneuropathy in diabetes(357.2)   . Pulmonary sarcoidosis ([place])    "problems w/it years ago; no problems anymore" (07/03/2014)  . Rectal bleeding   . Sleep apnea   . Type II diabetes mellitus ([place])     His Past Surgical History Is Significant For: Past Surgical History:  Procedure Laterality Date  . AMPUTATION Right 07/24/2016   Procedure: TRANSMETATARSAL FOOT AMPUTATION;  Surgeon: [name], MD;  Location: [facility];  Service: Orthopedics;   Laterality: Right;  . BALLOON DILATION N/A 03/21/2018   Procedure: BALLOON DILATION;  Surgeon: [name], MD;  Location: [facility] ENDOSCOPY;  Service: Endoscopy;  Laterality: N/A;  . BREAST LUMPECTOMY Right ~ 2000   "benign"  . CARDIAC CATHETERIZATION N/A 07/30/2015   Procedure: Left Heart Cath and Coronary Angiography;  Surgeon: Burnell Blanks, MD;  Location: [place] CV LAB;  Service: Cardiovascular;  Laterality: N/A;  . CARDIAC CATHETERIZATION N/A 07/19/2016   Procedure: Left Heart Cath and Coronary Angiography;  Surgeon: Belva Crome, MD;  Location: [place] CV LAB;  Service: Cardiovascular;  Laterality: N/A;  . CARDIAC CATHETERIZATION N/A 07/29/2016   Procedure: Coronary Stent Intervention;  Surgeon: Belva Crome, MD;  Location: [place] CV LAB;  Service: Cardiovascular;  Laterality: N/A;  . CARDIAC CATHETERIZATION N/A 07/29/2016   Procedure: Intravascular Ultrasound/IVUS;  Surgeon: Belva Crome, MD;  Location: [place] CV LAB;  Service: Cardiovascular;  Laterality: N/A;  . CATARACT EXTRACTION W/ INTRAOCULAR LENS  IMPLANT, BILATERAL Bilateral 2014-2015  . COLONOSCOPY WITH PROPOFOL N/A 08/22/2017   Procedure: COLONOSCOPY WITH PROPOFOL;  Surgeon: [name], MD;  Location: [facility];  Service: Endoscopy;  Laterality: N/A;  . CORONARY ANGIOPLASTY WITH STENT PLACEMENT  10/31/2011   30 % MID ATRIOVENTRICULAR GROOVE CX STENOSIS. 90 % MID RCA.PTA TO LAD/STENTING OF TANDEM 60-70%. LAD STENOSIS 99% REDUCED TO 0%.3.0 X 32MM PROMUS DES POSTDILATED TO 3.25MM.  [name] CORONARY ANGIOPLASTY WITH STENT PLACEMENT  07/03/2014   "2"  . CORONARY STENT INTERVENTION N/A 04/09/2019   Procedure: CORONARY STENT INTERVENTION;  Surgeon: Jettie Booze, MD;  Location: [place] CV LAB;  Service: Cardiovascular;  Laterality: N/A;  . CORONARY STENT INTERVENTION N/A 08/22/2019   Procedure: CORONARY STENT INTERVENTION;  Surgeon: Martinique, Peter M, MD;  Location: [place] CV [facility];  Service:  Cardiovascular;  Laterality: N/A;  . ESOPHAGOGASTRODUODENOSCOPY (EGD) WITH PROPOFOL N/A 03/21/2018   Procedure: ESOPHAGOGASTRODUODENOSCOPY (EGD) WITH PROPOFOL;  Surgeon: [name], MD;  Location: [place];  Service: Endoscopy;  Laterality: N/A;  . I & D EXTREMITY Right 10/30/2013   Procedure: IRRIGATION AND DEBRIDEMENT RIGHT SMALL FINGER POSSIBLE SECONDARY WOUND CLOSURE;  Surgeon: [name], MD;  Location: [facility];  Service: Orthopedics;  Laterality: Right;  [name] is available after 4pm  . I & D EXTREMITY Right 11/01/2013   Procedure:  IRRIGATION AND DEBRIDEMENT RIGHT  PROXIMAL PHALANGEAL LEVEL AMPUTATION;  Surgeon: [name], MD;  Location: [facility];  Service: Orthopedics;  Laterality: Right;  . INCISION AND DRAINAGE Right 10/28/2013   Procedure: INCISION AND DRAINAGE Right small finger;  Surgeon: [name], MD;  Location: [facility];  Service: Orthopedics;  Laterality: Right;  . LEFT HEART CATH Left 11/03/2011   Procedure: LEFT HEART CATH;  Surgeon: [name],  MD;  Location: [facility] CATH LAB;  Service: Cardiovascular;  Laterality: Left;  . LEFT HEART CATH AND CORONARY ANGIOGRAPHY N/A 04/09/2019   Procedure: LEFT HEART CATH AND CORONARY ANGIOGRAPHY;  Surgeon: Jettie Booze, MD;  Location: [place] [facility] LAB;  Service: Cardiovascular;  Laterality: N/A;  . LEFT HEART CATHETERIZATION WITH CORONARY ANGIOGRAM N/A 07/03/2014   Procedure: LEFT HEART CATHETERIZATION WITH CORONARY ANGIOGRAM;  Surgeon: [name], MD;  Location: [facility] C[facility] LAB;  Service: Cardiovascular;  Laterality: N/A;  . PERCUTANEOUS CORONARY STENT INTERVENTION (PCI-S) Left 11/03/2011   Procedure: PERCUTANEOUS CORONARY STENT INTERVENTION (PCI-S);  Surgeon: [name], MD;  Location: [facility] CATH LAB;  Service: Cardiovascular;  Laterality: Left;  . RIGHT/LEFT HEART CATH AND CORONARY ANGIOGRAPHY N/A 08/22/2019   Procedure: RIGHT/LEFT HEART CATH AND CORONARY ANGIOGRAPHY;  Surgeon: Martinique, Peter M, MD;   Location: [place] CV LAB;  Service: Cardiovascular;  Laterality: N/A;  . TOE AMPUTATION Right ~ 2010   "2nd toe"  . TOE AMPUTATION Right 2012   "3rd toe"    His Family History Is Significant For: Family History  Problem Relation Age of Onset  . Heart attack Maternal Aunt   . Diabetes Maternal Grandmother   . Hypertension Maternal Grandmother     His Social History Is Significant For: Social History   Socioeconomic History  . Marital status: Married    Spouse name: Not on file  . Number of children: Not on file  . Years of education: Not on file  . Highest education level: Not on file  Occupational History  . Not on file  Tobacco Use  . Smoking status: Never Smoker  . Smokeless tobacco: Never Used  Vaping Use  . Vaping Use: Never used  Substance and Sexual Activity  . Alcohol use: Yes    Comment: social   . Drug use: No  . Sexual activity: Yes  Other Topics Concern  . Not on file  Social History Narrative  . Not on file   Social Determinants of Health   Financial Resource Strain:   . Difficulty of Paying Living Expenses:   Food Insecurity:   . Worried About Charity fundraiser in the Last Year:   . Arboriculturist in the Last Year:   Transportation Needs:   . Film/video editor (Medical):   [name] Lack of Transportation (Non-Medical):   Physical Activity:   . Days of Exercise per Week:   . Minutes of Exercise per Session:   Stress:   . Feeling of Stress :   Social Connections:   . Frequency of Communication with Friends and Family:   . Frequency of Social Gatherings with Friends and Family:   . Attends Religious Services:   . Active Member of Clubs or Organizations:   . Attends Archivist Meetings:   [name] Marital Status:     His Allergies Are:  Allergies  Allergen Reactions  . Chlorthalidone Other (See Comments)    Causes dehydration, kidneys shut down  :   His Current Medications Are:  Outpatient Encounter Medications as of 06/09/2020   Medication Sig  . Accu-Chek FastClix Lancets MISC USE UP TO FOUR TIMES DAILY AS DIRECTED  . ACCU-CHEK GUIDE test strip USE UP TO FOUR TIMES DAILY AS DIRECTED  . acetaminophen (TYLENOL) 650 MG CR tablet Take 1,300 mg by mouth daily as needed for pain.  [name] allopurinol (ZYLOPRIM) 300 MG tablet Take 300 mg by mouth daily.   [name] amLODipine (NORVASC)  10 MG tablet Take 1 tablet (10 mg total) by mouth daily.  [name] aspirin EC 81 MG tablet Take 81 mg by mouth daily.   . blood glucose meter kit and supplies Dispense based on patient and insurance preference. Use up to four times daily as directed. (FOR ICD-10 E10.9, E11.9).  [name] ezetimibe (ZETIA) 10 MG tablet Take 1 tablet (10 mg total) by mouth daily.  . furosemide (LASIX) 80 MG tablet Take 80 mg by mouth. Takes 160 mg 3 days per week and 80 mg 4 days per week  . hydrALAZINE (APRESOLINE) 100 MG tablet Take 100 mg by mouth 3 (three) times daily.  . insulin aspart (NOVOLOG FLEXPEN) 100 UNIT/ML FlexPen Inject 10-15 Units into the skin 3 (three) times daily with meals.  . insulin glargine (LANTUS) 100 UNIT/ML injection Inject 0.3 mLs (30 Units total) into the skin 2 (two) times daily.  . isosorbide mononitrate (IMDUR) 120 MG 24 hr tablet TAKE 1 TABLET BY MOUTH TWICE DAILY HOLD UNTIL FOLLOW UP WITH DOCTOR  . liraglutide (VICTOZA) 18 MG/3ML SOPN Inject 0.3 mLs (1.8 mg total) into the skin daily.  . nitroGLYCERIN (NITROSTAT) 0.4 MG SL tablet Place 1 tablet (0.4 mg total) under the tongue every 5 (five) minutes as needed for chest pain.  . pantoprazole (PROTONIX) 40 MG tablet TAKE 1 TABLET BY MOUTH TWICE DAILY BEFORE MEAL(S)  . potassium chloride (KLOR-CON) 20 MEQ packet Take by mouth 2 (two) times daily. Unknown amount (2 pills daily)  . rosuvastatin (CRESTOR) 40 MG tablet Take 1 tablet by mouth once daily  . ticagrelor (BRILINTA) 90 MG TABS tablet Take 1 tablet (90 mg total) by mouth 2 (two) times daily.  . traMADol (ULTRAM) 50 MG tablet Take 1 tablet (50 mg total) by  mouth every 6 (six) hours as needed.  . metoprolol succinate (TOPROL-XL) 50 MG 24 hr tablet Take 3 tablets (150 mg total) by mouth daily. Take with or immediately following a meal.   No facility-administered encounter medications on file as of 06/09/2020.  :   Review of Systems:  Out of a complete 14 point review of systems, all are reviewed and negative with the exception of these symptoms as listed below:  Review of Systems  Neurological:       Pt reports he is here to discuss decrease in balance. Pt reports 4 years ago he noticed his balance being off and has worsened since. He report 3 falls over the last month. 1 of the falls resulting in a contusion to his right knee. Swelling is still present.   Pt also notes loss of coordination in bilateral hands. Ex--- Pt has trouble at time tying his shoes.     Objective:  Neurological Exam  Physical Exam Physical Examination:   Vitals:   06/09/20 1027  BP: (!) 150/87  Pulse: 87    General Examination: The patient is a very pleasant 64 y.o. male in no acute distress. He appears well-developed and well-nourished and well groomed.   HEENT: Normocephalic, atraumatic, pupils are equal, round and reactive to light, extraocular tracking is preserved, hearing is grossly intact, face is symmetric with normal facial animation and normal sensation to light touch, pinprick, temperature.  He has no dysarthria, airway examination reveals moderately crowded airway to significant crowding secondary to larger uvula and tonsils of about 2+, mild pharyngeal erythema noted, mild mouth dryness noted.  Tongue protrudes centrally and palate elevates symmetrically.  No dysarthria or hypophonia.   Chest: Clear to auscultation without  wheezing, rhonchi or crackles noted.  Heart: S1+S2+0, regular and normal without murmurs, rubs or gallops noted.   Abdomen: Soft, non-tender and non-distended with normal bowel sounds appreciated on auscultation.  Extremities:  There is no pitting edema in the distal lower extremities bilaterally. Pedal pulses are intact.  Skin: Warm and dry with multiple hyperpigmented spots and scratch marks throughout his visible skin and on the abdomen and chest.   Musculoskeletal: exam reveals right knee pain.  He reports left-sided low back pain without radiation.  Right forefoot amputation status.  Neurologically:  Mental status: The patient is awake, alert and oriented in all 4 spheres. His immediate and remote memory, attention, language skills and fund of knowledge are appropriate. There is no evidence of aphasia, agnosia, apraxia or anomia. Speech is clear with normal prosody and enunciation. Thought process is linear. Mood is normal and affect is normal.  Cranial nerves II - XII are as described above under HEENT exam. In addition: shoulder shrug is normal with equal shoulder height noted. Motor exam: Normal bulk, strength and tone is noted, with the exception of foot dorsiflexion weakness bilaterally. There is no drift, tremor or rebound. Romberg is not tested due to safety concerns.  Reflexes are trace in the upper extremities and absent in the lower extremities.  Babinski: Toes are downgoing on the L. Fine motor skills and coordination: intact grossly in the upper and lower extremities.   Cerebellar testing: No dysmetria or intention tremor.  Sensory exam: intact to light touch, and pinprick sensation in the upper extremities with decrease in temperature sense in the distal upper extremities to the forearm areas.  Decreased sensation to all modalities in the lower extremities up to the upper calf areas bilaterally.    Gait, station and balance: He stands with difficulty.  He stands slightly wide-based, posture is age-appropriate.  He walks with a limp on the right and walks slightly wide-based.  He did not bring any walking sticks.   Tandem walk is not tested due to safety concerns.  Assessment and Plan:  In summary, IZYAN EZZELL is a very pleasant 64 y.o.-year old male with an underlying medical history of hypertension, type 2 diabetes, gout, right foot osteomyelitis with s/p amputation R forefoot in 07/2016, coronary artery disease, chronic kidney disease, and associated anemia and hyper parathyroidism, CHF, OSA on CPAP, and morbid obesity with a BMI of over 45, who presents for evaluation of his gait disorder and recurrent falls, concern for neuropathy as well.  His examination is indeed concerning for neuropathy, most likely secondary to suboptimally controlled diabetes.  He has a longer standing history of uncontrolled diabetes.  His gait shows nonspecific difficulty and insecurity.  Most likely, he has several contributing factors to his gait and balance problems and recurrent falls, including prior falls with injury, arthritis, degenerative lower back disease, neuropathy, obesity.  He is advised to use his walking sticks as recommended by physical therapy.  He is advised to monitor his right knee pain and consult with orthopedics if needed.  We will proceed with a lumbar spine MRI without contrast.  He is advised that for his neuropathy, stricter diabetes control will be key.  I would like to proceed with additional blood work to rule out any other causes for neuropathy.  He does not have much in the way of pain, has occasional aching at night but no burning sensation.  We talked about symptomatic treatments for discomfort and pain in neuropathy.  We can consider  gabapentin but he currently does not have any significant pain and given his chronic kidney disease I would like to be cautious with any medications.  He is agreeable to this approach.  I suggested we proceed with an EMG nerve conduction velocity test through our office.  We will keep him posted as to his test results by phone call and follow-up in this office if needed.  I answered all his questions today and he was in agreement.    Thank you very much for allowing  me to participate in the care of this nice patient. If I can be of any further assistance to you please do not hesitate to call me at [phone].  Sincerely,   Star Age, MD, PhD

## 2020-06-09 NOTE — Patient Instructions (Signed)
I believe you have a multifactorial gait disorder, meaning, that it is [place] is due to a combination of factors. These factors include: degenerative arthritis of your back, prior falls, likely diabetic neuropathy, arthritis and pain of the right knee, prior amputation of the right forefoot.  Please remember to stand up slowly and get your bearings first turn slowly, no bending down to pick anything, no heavy lifting, be extra careful at night and first thing in the morning. Also, be careful in the Bathroom and the kitchen.  Please use your walking sticks as recommended by physical.  Monitor your right knee pain, you may benefit from seeing an orthopedic surgeon for the right knee pain.  Please avoid drinking alcohol altogether and stay well-hydrated with water within recommended limits.  As far as your medications are concerned, I would like to suggest no new medications.   As far as diagnostic testing: I suggest we proceed with a lower back MRI to look for degenerative changes, in addition, we will check blood work today to look for any treatable causes for neuropathy.  Your likely cause is diabetes.  We will also proceed with electrical nerve and muscle testing called EMG and nerve conduction velocity testing through our office.  We will keep you posted as to your test results by phone call and follow-up if needed.

## 2020-06-09 NOTE — Telephone Encounter (Signed)
medicaid order sent to GI. They will obtain the auth and reach out to the patient to schedule.  °

## 2020-06-10 NOTE — Progress Notes (Signed)
Please call patient and advise him that his blood tests were for the most part normal, B12 vitamin was elevated, likely secondary to taking supplemental vitamin B12, his CK level which is a muscle enzyme was also elevated, can be seen in the context of recent fall or falls.  He has had higher levels in the past as far back as 2015, it is still in the same range as in the past.  One test is pending which is the vitamin B6 level, we will update if abnormal.  As discussed, we will proceed with EMG and nerve conduction velocity testing through our office.

## 2020-06-11 ENCOUNTER — Telehealth: Payer: Self-pay

## 2020-06-11 NOTE — Telephone Encounter (Signed)
-----   Message from Star Age, MD sent at 06/10/2020  4:51 PM EDT ----- Please call patient and advise him that his blood tests were for the most part normal, B12 vitamin was elevated, likely secondary to taking supplemental vitamin B12, his CK level which is a muscle enzyme was also elevated, can be seen in the context of recent fall or falls.  He has had higher levels in the past as far back as 2015, it is still in the same range as in the past.  One test is pending which is the vitamin B6 level, we will update if abnormal.  As discussed, we will proceed with EMG and nerve conduction velocity testing through our office.

## 2020-06-11 NOTE — Telephone Encounter (Signed)
Pt notified of results and verbalized understanding. Pt will proceed with ncs/emg as scheduled for July. Pt had no questions/concerns at the time of call.

## 2020-06-14 LAB — CK: Total CK: 582 U/L (ref 41–331)

## 2020-06-14 LAB — HEAVY METALS PROFILE II, BLOOD
Arsenic: 6 ug/L (ref 2–23)
Cadmium: <0.5 ug/L (ref 0.0–1.2)
Lead, Blood: <1 ug/dL (ref 0–4)
Mercury: <1 ug/L (ref 0.0–14.9)

## 2020-06-14 LAB — VITAMIN B6: Vitamin B6: 5.4 ug/L (ref 5.3–46.7)

## 2020-06-14 LAB — RPR: RPR Ser Ql: NONREACTIVE

## 2020-06-14 LAB — TSH: TSH: 3.36 u[IU]/mL (ref 0.450–4.500)

## 2020-06-14 LAB — B12 AND FOLATE PANEL
Folate: 8.8 ng/mL (ref >3.0)
Vitamin B-12: 1612 pg/mL — ABNORMAL HIGH (ref 232–1245)

## 2020-06-14 LAB — ANA W/REFLEX: Anti Nuclear Antibody (ANA): NEGATIVE

## 2020-06-18 ENCOUNTER — Telehealth: Payer: Self-pay

## 2020-06-18 NOTE — Telephone Encounter (Signed)
[employer] community medicaid pending. Faxed notes.

## 2020-06-18 NOTE — Telephone Encounter (Signed)
Joshua [name] is a 64 y.o. male called in to let the Dr know insurance will not pay for his MRI. He would like to speak to someone for more options.

## 2020-06-18 NOTE — Telephone Encounter (Signed)
I returned the pt's call... He sts he received a certified letter stating what he believes to be a denial for his upcoming MRI on 06/26/2020.  I spoke with with Raquel Sarna E on this and she sts a denial is not on file but an Josem Kaufmann is pending through [place]. She was unable to see the details since our office does not bill through medicaid. I advised the pt of these details and provided [facility]'s #. Pt was advised to call and determine status on the authorization and to call us back with what information he receives. Pt verbalized understanding.

## 2020-06-23 NOTE — Telephone Encounter (Signed)
[employer] community medicaid denied MRI. See phone note for 06/18/20.

## 2020-06-23 NOTE — Telephone Encounter (Signed)
Please advise patient that I am aware of the denial for the lumbar spine MRI.  As discussed, he may benefit from seeing a spine specialist and sometimes the specialist can get the MRI approved.  Nevertheless, for now, from my end of things, way we will proceed with the electrical nerve and muscle testing which will also give [place] some idea about degenerative spine disease and pinched nerve type findings and we can reevaluate the need for a lumbar spine MRI after the EMG.  We will keep him posted as to his test results after the report is available.

## 2020-06-23 NOTE — Telephone Encounter (Signed)
Pt notified of MD's response and verbalized understanding.  We will proceed with 07/16/2020 NCS/EMG study.

## 2020-06-23 NOTE — Telephone Encounter (Signed)
[facility] Community Medicaid plan did not approve the MRI. The denial reason is the patient has not completed 6 week of treatment and have a follow up after the treatment in the last 3 months..  If you would like to do a peer to peer. The phone number is [phone]. The case number is [id number].

## 2020-06-24 ENCOUNTER — Encounter: Payer: Self-pay | Admitting: Orthopaedic Surgery

## 2020-06-24 ENCOUNTER — Ambulatory Visit (INDEPENDENT_AMBULATORY_CARE_PROVIDER_SITE_OTHER): Payer: Medicaid Other | Admitting: Orthopaedic Surgery

## 2020-06-24 VITALS — Ht 69.0 in | Wt 293.0 lb

## 2020-06-24 DIAGNOSIS — G8929 Other chronic pain: Secondary | ICD-10-CM

## 2020-06-24 DIAGNOSIS — M25561 Pain in right knee: Secondary | ICD-10-CM | POA: Diagnosis not present

## 2020-06-24 MED ORDER — BUPIVACAINE HCL 0.5 % IJ SOLN
2.0000 mL | INTRAMUSCULAR | Status: AC | PRN
  Administered 2020-06-24: 2 mL via INTRA_ARTICULAR

## 2020-06-24 MED ORDER — LIDOCAINE HCL 1 % IJ SOLN
2.0000 mL | INTRAMUSCULAR | Status: AC | PRN
  Administered 2020-06-24: 2 mL

## 2020-06-24 MED ORDER — METHYLPREDNISOLONE ACETATE 40 MG/ML IJ SUSP
40.0000 mg | INTRAMUSCULAR | Status: AC | PRN
  Administered 2020-06-24: 40 mg via INTRA_ARTICULAR

## 2020-06-24 NOTE — Progress Notes (Signed)
Office Visit Note   Patient: Joshua [name]           Date of Birth: 05-19-1956           MRN: [id number] Visit Date: 06/24/2020              Requested by: [name], MD [address],  [place] [address] PCP: [name], MD   Assessment & Plan: Visit Diagnoses:  1. Chronic pain of right knee     Plan: Impression is chronic right knee pain.  Aspiration injection performed with 10 cc of joint fluid removed.  Compression wrap applied.  Patient will pay attention to his response to this over the next 4 to 6 weeks.  He will follow-up as needed.  Follow-Up Instructions: Return if symptoms worsen or fail to improve.   Orders:  No orders of the defined types were placed in this encounter.  No orders of the defined types were placed in this encounter.     Procedures: Large Joint Inj: R knee on 06/24/2020 7:03 PM Indications: pain Details: 22 G needle  Arthrogram: No  Medications: 40 mg methylPREDNISolone acetate 40 MG/ML; 2 mL lidocaine 1 %; 2 mL bupivacaine 0.5 % Consent was given by the patient. Patient was prepped and draped in the usual sterile fashion.       Clinical Data: No additional findings.   Subjective: Chief Complaint  Patient presents with  . Right Knee - Pain    Joshua [name] comes in today for right knee pain of approximately 2 months.  He had an injury in which she fell into a wall.  He feels like the knee gives out.  He has done physical therapy and the therapist is concerned about balance issues.  He has not had any previous knee surgeries.  He feels pain along the sides of his knee.  Denies any mechanical symptoms.  Denies any numbness and tingling.   Review of Systems  Constitutional: Negative.   All other systems reviewed and are negative.    Objective: Vital Signs: Ht $RemoveB'5\' 9"'wyjDUktU$  (1.753 m)   Wt 293 lb (132.9 kg)   BMI 43.27 kg/m   Physical Exam Vitals and nursing note reviewed.  Constitutional:      Appearance: He is  well-developed.  Pulmonary:     Effort: Pulmonary effort is normal.  Abdominal:     Palpations: Abdomen is soft.  Skin:    General: Skin is warm.  Neurological:     Mental Status: He is alert and oriented to person, place, and time.  Psychiatric:        Behavior: Behavior normal.        Thought Content: Thought content normal.        Judgment: Judgment normal.     Ortho Exam Right knee shows a small joint effusion.  Collaterals and cruciates are stable.  No significant joint line tenderness.  Range of motion is normal. Specialty Comments:  No specialty comments available.  Imaging: No results found.   PMFS History: Patient Active Problem List   Diagnosis Date Noted  . Unstable angina ([place]) 08/17/2019  . Chest pain 08/16/2019  . NSTEMI (non-ST elevated myocardial infarction) ([place]) 04/06/2019  . ACS (acute coronary syndrome) ([place]) 04/06/2019  . Sepsis ([place]) 03/15/2018  . DKA (diabetic ketoacidoses) ([place]) 03/15/2018  . Special screening for malignant neoplasms, colon 08/22/2017  . Acute renal failure superimposed on stage 3 chronic kidney disease ([place]) 05/19/2017  . Essential hypertension   .  Status post transmetatarsal amputation of foot, right ([place]) 11/08/2016  . Diabetic polyneuropathy associated with type 2 diabetes mellitus ([place]) 11/08/2016  . Pure hypercholesterolemia   . AKI (acute kidney injury) ([place])   . Acute blood loss anemia   . DM type 2 with diabetic peripheral neuropathy ([place])   . Physical debility 08/03/2016  . Chronic osteomyelitis of right foot ([place])   . Cellulitis and abscess of leg, except foot   . Penetrating wound of right foot   . CKD (chronic kidney disease) stage 3, GFR 30-59 ml/min (H[facility])   . Unstable angina pectoris ([place]) 07/18/2016  . Chronic combined systolic and diastolic congestive heart failure ([place])   . Chest pain with moderate risk for cardiac etiology 06/24/2014  . CAD S/P multiple PCIs 06/24/2014  . Sleep apnea- on C-pap 06/24/2014   . Morbid obesity due to excess calories ([place]) 11/10/2013  . Hyperglycemia 10/29/2013  . Hyperlipidemia 01/12/2011  . Benign essential HTN 01/12/2011  . GERD 01/12/2011   Past Medical History:  Diagnosis Date  . Anemia   . Chronic combined systolic and diastolic CHF (congestive heart failure) ([place])    a. 07/2015 Echo: EF 45-50%, mod LVH, mid apical/antsept AK, Gr 1 DD.  [name] CKD (chronic kidney disease), stage III   . Coronary artery disease    a. 2012 s/p MI/PCI: s/p DES to mid/dist RCA and overlapping DES to LAD;  b. 07/2015 Cath: LAD 10% ISR, D1 40(jailed), LCX 68m, OM2 30, OM3 30, RCA 35m ISR, 10d ISR; c. 07/2016 NSTEMI/PCI: LAD 85ost/p/m (3.5x20 Synergy DES overlapping prior stent), D1 40, LCX 28m, OM2 95 (2.75x16 Synergy DES), OM3 30, RCA 15m, 10d.  . Depression   . Diabetic gastroparesis ([place])   . DKA (diabetic ketoacidoses) ([place]) 03/14/2018  . GERD (gastroesophageal reflux disease)   . Gout   . Heart murmur   . Hyperlipemia   . Hypertensive heart disease   . Ischemic cardiomyopathy    a. 07/2015 Echo: EF 45-50%, mod LVH, mid-apicalanteroseptal AK, Gr1 DD.  . Morbid obesity ([place])   . OSA on CPAP   . Osteomyelitis ([place])    a. 07/2016 MTP joint of R great toe s/p transmetatarsal amputation.  . Polyneuropathy in diabetes(357.2)   . Pulmonary sarcoidosis ([place])    "problems w/it years ago; no problems anymore" (07/03/2014)  . Rectal bleeding   . Sleep apnea   . Type II diabetes mellitus (HCC)     Family History  Problem Relation Age of Onset  . Heart attack Maternal Aunt   . Diabetes Maternal Grandmother   . Hypertension Maternal Grandmother     Past Surgical History:  Procedure Laterality Date  . AMPUTATION Right 07/24/2016   Procedure: TRANSMETATARSAL FOOT AMPUTATION;  Surgeon: [name], MD;  Location: [facility];  Service: Orthopedics;  Laterality: Right;  . BALLOON DILATION N/A 03/21/2018   Procedure: BALLOON DILATION;  Surgeon: [name], MD;  Location: [facility] ENDOSCOPY;   Service: Endoscopy;  Laterality: N/A;  . BREAST LUMPECTOMY Right ~ 2000   "benign"  . CARDIAC CATHETERIZATION N/A 07/30/2015   Procedure: Left Heart Cath and Coronary Angiography;  Surgeon: Burnell Blanks, MD;  Location: [facility] CV LAB;  Service: Cardiovascular;  Laterality: N/A;  . CARDIAC CATHETERIZATION N/A 07/19/2016   Procedure: Left Heart Cath and Coronary Angiography;  Surgeon: Belva Crome, MD;  Location: [facility] CV LAB;  Service: Cardiovascular;  Laterality: N/A;  . CARDIAC CATHETERIZATION N/A 07/29/2016   Procedure: Coronary Stent Intervention;  Surgeon: Belva Crome, MD;  Location: [facility] LAB;  Service: Cardiovascular;  Laterality: N/A;  . CARDIAC CATHETERIZATION N/A 07/29/2016   Procedure: Intravascular Ultrasound/IVUS;  Surgeon: Belva Crome, MD;  Location: [place] [facility];  Service: Cardiovascular;  Laterality: N/A;  . CATARACT EXTRACTION W/ INTRAOCULAR LENS  IMPLANT, BILATERAL Bilateral 2014-2015  . COLONOSCOPY WITH PROPOFOL N/A 08/22/2017   Procedure: COLONOSCOPY WITH PROPOFOL;  Surgeon: [name], MD;  Location: [facility];  Service: Endoscopy;  Laterality: N/A;  . CORONARY ANGIOPLASTY WITH STENT PLACEMENT  10/31/2011   30 % MID ATRIOVENTRICULAR GROOVE CX STENOSIS. 90 % MID RCA.PTA TO LAD/STENTING OF TANDEM 60-70%. LAD STENOSIS 99% REDUCED TO 0%.3.0 X 32MM PROMUS DES POSTDILATED TO 3.25MM.  [name] CORONARY ANGIOPLASTY WITH STENT PLACEMENT  07/03/2014   "2"  . CORONARY STENT INTERVENTION N/A 04/09/2019   Procedure: CORONARY STENT INTERVENTION;  Surgeon: Jettie Booze, MD;  Location: [facility] CV LAB;  Service: Cardiovascular;  Laterality: N/A;  . CORONARY STENT INTERVENTION N/A 08/22/2019   Procedure: CORONARY STENT INTERVENTION;  Surgeon: Martinique, Peter M, MD;  Location: [facility];  Service: Cardiovascular;  Laterality: N/A;  . ESOPHAGOGASTRODUODENOSCOPY (EGD) WITH PROPOFOL N/A 03/21/2018   Procedure: ESOPHAGOGASTRODUODENOSCOPY (EGD)  WITH PROPOFOL;  Surgeon: [name], MD;  Location: [facility];  Service: Endoscopy;  Laterality: N/A;  . I & D EXTREMITY Right 10/30/2013   Procedure: IRRIGATION AND DEBRIDEMENT RIGHT SMALL FINGER POSSIBLE SECONDARY WOUND CLOSURE;  Surgeon: [name], MD;  Location: [facility];  Service: Orthopedics;  Laterality: Right;  [name] is available after 4pm  . I & D EXTREMITY Right 11/01/2013   Procedure:  IRRIGATION AND DEBRIDEMENT RIGHT  PROXIMAL PHALANGEAL LEVEL AMPUTATION;  Surgeon: [name], MD;  Location: [facility];  Service: Orthopedics;  Laterality: Right;  . INCISION AND DRAINAGE Right 10/28/2013   Procedure: INCISION AND DRAINAGE Right small finger;  Surgeon: [name], MD;  Location: [facility];  Service: Orthopedics;  Laterality: Right;  . LEFT HEART CATH Left 11/03/2011   Procedure: LEFT HEART CATH;  Surgeon: [name], MD;  Location[facility] CATH LAB;  Service: Cardiovascular;  Laterality: Left;  . LEFT HEART CATH AND CORONARY ANGIOGRAPHY N/A 04/09/2019   Procedure: LEFT HEART CATH AND CORONARY ANGIOGRAPHY;  Surgeon: Jettie Booze, MD;  Location: [facility] CV LAB;  Service: Cardiovascular;  Laterality: N/A;  . LEFT HEART CATHETERIZATION WITH CORONARY ANGIOGRAM N/A 07/03/2014   Procedure: LEFT HEART CATHETERIZATION WITH CORONARY ANGIOGRAM;  Surgeon: [name], MD;  [facility] LAB;  Service: Cardiovascular;  Laterality: N/A;  . PERCUTANEOUS CORONARY STENT INTERVENTION (PCI-S) Left 11/03/2011   Procedure: PERCUTANEOUS CORONARY STENT INTERVENTION (PCI-S);  Surgeon: [name], MD;  Location: [facility] CATH LAB;  Service: Cardiovascular;  Laterality: Left;  . RIGHT/LEFT HEART CATH AND CORONARY ANGIOGRAPHY N/A 08/22/2019   Procedure: RIGHT/LEFT HEART CATH AND CORONARY ANGIOGRAPHY;  Surgeon: Martinique, Peter M, MD;  Location: [place] CV LAB;  Service: Cardiovascular;  Laterality: N/A;  . TOE AMPUTATION Right ~ 2010   "2nd toe"  . TOE AMPUTATION Right  2012   "3rd toe"   Social History   Occupational History  . Not on file  Tobacco Use  . Smoking status: Never Smoker  . Smokeless tobacco: Never Used  Vaping Use  . Vaping Use: Never used  Substance and Sexual Activity  . Alcohol use: Yes    Comment: social   . Drug use: No  .  Sexual activity: Yes

## 2020-06-26 ENCOUNTER — Other Ambulatory Visit: Payer: Medicaid Other

## 2020-07-03 ENCOUNTER — Encounter: Payer: Medicaid Other | Admitting: Diagnostic Neuroimaging

## 2020-07-16 ENCOUNTER — Ambulatory Visit (INDEPENDENT_AMBULATORY_CARE_PROVIDER_SITE_OTHER): Payer: Medicaid Other | Admitting: Neurology

## 2020-07-16 ENCOUNTER — Encounter: Payer: Self-pay | Admitting: Neurology

## 2020-07-16 ENCOUNTER — Ambulatory Visit: Payer: Medicaid Other | Admitting: Neurology

## 2020-07-16 DIAGNOSIS — G8929 Other chronic pain: Secondary | ICD-10-CM

## 2020-07-16 DIAGNOSIS — R2689 Other abnormalities of gait and mobility: Secondary | ICD-10-CM

## 2020-07-16 DIAGNOSIS — E1142 Type 2 diabetes mellitus with diabetic polyneuropathy: Secondary | ICD-10-CM

## 2020-07-16 DIAGNOSIS — G629 Polyneuropathy, unspecified: Secondary | ICD-10-CM | POA: Diagnosis not present

## 2020-07-16 DIAGNOSIS — IMO0002 Reserved for concepts with insufficient information to code with codable children: Secondary | ICD-10-CM

## 2020-07-16 DIAGNOSIS — E1122 Type 2 diabetes mellitus with diabetic chronic kidney disease: Secondary | ICD-10-CM

## 2020-07-16 DIAGNOSIS — M545 Low back pain, unspecified: Secondary | ICD-10-CM

## 2020-07-16 DIAGNOSIS — M25561 Pain in right knee: Secondary | ICD-10-CM

## 2020-07-16 DIAGNOSIS — R296 Repeated falls: Secondary | ICD-10-CM

## 2020-07-16 NOTE — Procedures (Signed)
     HISTORY:  Joshua [name] is a 64 year old gentleman with a 30-year history of diabetes.  The patient has gradually noted over the last 4 years a worsening problem with gait instability and a tendency to fall.  The patient reports some numbness in the feet.  He has been evaluated for a possible neuropathy.  He also reports some low back pain without radiation down the legs.  NERVE CONDUCTION STUDIES:  Nerve conduction studies were performed on the left upper extremity.  The distal motor latency for the left median nerve was significantly prolonged with a low motor amplitude.  No response was seen for the left ulnar nerve.  The nerve conduction velocity for the left median nerve was normal.  The sensory latency of the left radial nerve was borderline normal, and was unobtainable for the left median and ulnar nerves.  Nerve conduction studies were performed on the left lower extremity.  The left peroneal and posterior tibial nerves were unobtainable.  The left sural and peroneal sensory latencies were unobtainable.  EMG STUDIES:  EMG study was performed on the left lower extremity:  The tibialis anterior muscle reveals 2 to 5K motor units with decreased recruitment. 3+ fibrillations and positive waves were seen. The peroneus tertius muscle reveals 0.5 to 1K motor units with significantly reduced recruitment, polyphasic units were seen. 1+ fibrillations and positive waves were seen. The medial gastrocnemius muscle reveals 1 to 3K motor units with decreased recruitment. 2+ fibrillations and positive waves were seen. The vastus lateralis muscle reveals 2 to 4K motor units with full recruitment. No fibrillations or positive waves were seen. The iliopsoas muscle reveals 2 to 4K motor units with full recruitment. No fibrillations or positive waves were seen. The biceps femoris muscle (long head) reveals 2 to 3K motor units with full recruitment. No fibrillations or positive waves were seen. The  lumbosacral paraspinal muscles were tested at 3 levels, and revealed no abnormalities of insertional activity at all 3 levels tested. There was poor relaxation.   IMPRESSION:  Nerve conduction studies done on the left upper and left lower extremity shows evidence of a severe primarily axonal peripheral neuropathy, possibly secondary to diabetes.  EMG of the left lower extremity shows distal acute and chronic denervation consistent with a diagnosis of peripheral neuropathy without clear evidence of an overlying lumbosacral radiculopathy.  Jill Alexanders MD 07/16/2020 4:24 PM  [facility] [address] [place], [place] [address]  Phone [phone] Fax [phone]

## 2020-07-16 NOTE — Progress Notes (Signed)
Please refer to EMG and nerve conduction procedure note.  

## 2020-07-16 NOTE — Progress Notes (Signed)
[facility]    Nerve / Sites Muscle Latency Ref. Amplitude Ref. Rel Amp Segments Distance Velocity Ref. Area    ms ms mV mV %  cm m/s m/s mVms  L Median - APB     Wrist APB 11.2 ?4.4 2.0 ?4.0 100 Wrist - APB 7   5.2     Upper arm APB 15.9  3.4  166 Upper arm - Wrist 25 53 ?49 16.3  L Ulnar - ADM     Wrist ADM NR ?3.3 NR ?6.0 NR Wrist - ADM 7   NR     B.Elbow ADM NR  NR  NR B.Elbow - Wrist 23 NR ?49 NR  L Peroneal - EDB     Ankle EDB NR ?6.5 NR ?2.0 NR Ankle - EDB 9   NR     Fib head EDB NR  NR  NR Fib head - Ankle 30 NR ?44 NR  L Tibial - AH     Ankle AH NR ?5.8 NR ?4.0 NR Ankle - AH 9   NR     Pop fossa AH NR  NR  NR Pop fossa - Ankle 38 NR ?41 NR             [facility]    Nerve / Sites Rec. Site Peak Lat Ref.  Amp Ref. Segments Distance    ms ms V V  cm  L Radial - Anatomical snuff box (Forearm)     Forearm Wrist 2.9 ?2.9 15 ?15 Forearm - Wrist 10  L Sural - Ankle (Calf)     Calf Ankle NR ?4.4 NR ?6 Calf - Ankle 14  L Superficial peroneal - Ankle     Lat leg Ankle NR ?4.4 NR ?6 Lat leg - Ankle 14  L Median - Orthodromic (Dig II, Mid palm)     Dig II Wrist NR ?3.4 NR ?10 Dig II - Wrist 14  L Ulnar - Orthodromic, (Dig V, Mid palm)     Dig V Wrist NR ?3.1 NR ?5 Dig V - Wrist 11

## 2020-07-21 ENCOUNTER — Encounter: Payer: Medicare Other | Attending: Internal Medicine | Admitting: Dietician

## 2020-07-21 ENCOUNTER — Encounter: Payer: Self-pay | Admitting: Dietician

## 2020-07-21 ENCOUNTER — Other Ambulatory Visit: Payer: Self-pay

## 2020-07-21 DIAGNOSIS — E1142 Type 2 diabetes mellitus with diabetic polyneuropathy: Secondary | ICD-10-CM | POA: Insufficient documentation

## 2020-07-21 NOTE — Patient Instructions (Addendum)
Be sure that you are drinking adequate hydration (water is a great choice, mio and diet (sprite) soda can be fine). Consistency as much as possible. 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 Find ways to add more vegetables Aim to take your Novolog before meals.

## 2020-07-21 NOTE — Progress Notes (Signed)
Medical Nutrition Therapy:  Appt start time: 1700 end time:  1730.   Assessment:  Primary concerns today: .  Patient is here today alone for diabetes and CKD follow up.  He was last seen by myself on 06/03/2020.  Patient states that he is still in his home and it needs more work including and heat and air unit. He is not sleeping well.  He has a C-pap but his current one cuts his nostril and he has an appointment in September to reevaluate.  He is checking his blood sugar "more than he was but not as vigilantly as prior to the past appointment"- usually once per day with fasting readings 60-400 and was 232 this am.  (average 200's per patient).  He knew he had the low blood glucose and treated this.  He states it was due to not eating regularly.  He states that he has now developed neuropathy (severe lower left leg and foot).  Spots on legs and arms to be biopsied tomorrow.  States that his diet is worse since his last visit.  More take out.  At times skips meals and won't eat until [employer]  Eating only small amounts.  He states that his conscience bothers him knowing that he should not eat these foods. He has been eating more watermelon and apples.  He is not walking much except at work. 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 He does not think that he can be more consistent with his blood sugar monitoring, insulin, or meals.  History includes Type 2 diabetes, CHF, CKD4, gastroparesis, OSA with new mask (which he is unable to use). Medications include Lantus 30 units bid and Novolog 10-15 units which is to be given before meals but patient continues to take after meals at times as eating is random and does not include carbohydrates at times. A1C 8.9% 05/15/20 decreased from 13.8% 01/28/2020.  Weight hx: 297 lbs 07/21/2020 300 lbs 06/03/2020 309 lbs 05/15/2020 (increased due to fluid) 306 lbs 05/06/2020 312 lbs 04/10/2020 (not eating much, presumed fluid gain based on  examination) 300 lbs3/25/2021 319 lbs 01/28/20 304 lbs 04/2019 323 lbs about 02/14/20 highest resent weight 195 lbs lowest adult weight freshman in college during increased football practice  Patient lives with his wife and his sone.  His son is paying the mortgage.  His wife has mobility issus.  He walks with a cane.  Shopping causes him increased physical pain.  He finds it difficult to meet his own nutritional needs as well as the preferences of his family and overall is not cooking. 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 He is on disability. Middle son has type 2 diabetes and recently diagnosed. States that his oldest son uses alcohol and they encourage each other in this habit.  Preferred Learning Style:   No preference indicated   Learning Readiness:   Contemplating  DIETARY INTAKE:  24-hr recall:  4 pm yesterday:   leftover pork (high fat, 1 cup high sodium potatoes, 1/3 cup rice and sausage Last night:  1 slice pizza and 7-8 tortilla chips with queso Thus far today:  6-7 grapes, 1/2 cup cantelope, 2 cubes watermelon Beverages: water, diet soda (sprite), low fat milk, diet gatorade, mio  Usual physical activity: walking  Progress Towards Goal(s):  In progress.   Nutritional Diagnosis:  NB-1.1 Food and  nutrition-related knowledge deficit As related to balance of carbohydrate, protein, and fat.  As evidenced by diet hx and patient report.    Intervention:  Nutrition counseling/education related to eating habits to increase nutrition quality.  Recommended increasing his vegetable intake, continuing to drink water rather than diet soda.  Discussed recommendation of a regular meal schedule.   Recommended taking his Novolog prior to his meal and eat sitting down in the kitchen or dining room if possible. [name] verbalized that he does not feel that he can be more consistent  with his blood sugar management than he is.  Encouraged self care and as much consistency as he is able. He is his quarterback.    Plan: Be sure that you are drinking adequate hydration (water is a great choice, mio and diet (sprite) soda can be fine). Consistency as much as possible. 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 Find ways to add more vegetables Aim to take your Novolog before meals.   Teaching Method Utilized:  Auditory  Handouts given during visit include:  Food Resource page (for free produce/grocery pick up locations)  Barriers to learning/adherence to lifestyle change: finances, social, health, motivation  Demonstrated degree of understanding via:  Teach Back   Monitoring/Evaluation:  Dietary intake, exercise, and body weight with follow up in 2 months.[name]

## 2020-08-15 ENCOUNTER — Ambulatory Visit (INDEPENDENT_AMBULATORY_CARE_PROVIDER_SITE_OTHER): Payer: Medicare Other | Admitting: Internal Medicine

## 2020-08-15 ENCOUNTER — Other Ambulatory Visit: Payer: Self-pay

## 2020-08-15 ENCOUNTER — Encounter: Payer: Self-pay | Admitting: Internal Medicine

## 2020-08-15 VITALS — BP 110/70 | HR 92 | Ht 69.0 in | Wt 291.0 lb

## 2020-08-15 DIAGNOSIS — E785 Hyperlipidemia, unspecified: Secondary | ICD-10-CM

## 2020-08-15 DIAGNOSIS — E1165 Type 2 diabetes mellitus with hyperglycemia: Secondary | ICD-10-CM | POA: Diagnosis not present

## 2020-08-15 DIAGNOSIS — I2510 Atherosclerotic heart disease of native coronary artery without angina pectoris: Secondary | ICD-10-CM

## 2020-08-15 DIAGNOSIS — Z9861 Coronary angioplasty status: Secondary | ICD-10-CM | POA: Diagnosis not present

## 2020-08-15 DIAGNOSIS — E1159 Type 2 diabetes mellitus with other circulatory complications: Secondary | ICD-10-CM | POA: Diagnosis not present

## 2020-08-15 DIAGNOSIS — E669 Obesity, unspecified: Secondary | ICD-10-CM | POA: Diagnosis not present

## 2020-08-15 LAB — POCT GLYCOSYLATED HEMOGLOBIN (HGB A1C): Hemoglobin A1C: 7.5 % — AB (ref 4.0–5.6)

## 2020-08-15 NOTE — Progress Notes (Signed)
[name] ID: [name], male   DOB: November 23, 1956, 64 y.o.   MRN: [id number]   This visit occurred during the SARS-CoV-2 public health emergency.  Safety protocols were in place, including screening questions prior to the visit, additional usage of staff PPE, and extensive cleaning of exam room while observing appropriate contact time as indicated for disinfecting solutions.   HPI: Joshua [name] is a 64 y.o.-year-old male, initially referred by his PCP, Dr. Alyson [name], returning for follow-up for DM2, dx in 1990s, insulin-dependent, uncontrolled, with complications (CAD - s/p AMI 2012, s/p PCIs, ischemic cardiomyopathy, CHF, CKD stage 4, PN - s/p R transmetatarsal amputation, gastroparesis, history of DKA). He saw Dr Joshua [name] initially, then Dr. Chalmers [name] for 20 years, but she is not accepting Medicaid patients anymore so he had to switch providers.  Last visit with me 3 months ago..  Reviewed HbA1c levels: Lab Results  Component Value Date   HGBA1C 8.9 (A) 05/15/2020   HGBA1C 13.8 (A) 01/28/2020   HGBA1C 10.2 (H) 08/16/2019   HGBA1C 15.0 (H) 04/07/2019   HGBA1C >15.5 (H) 03/15/2018   HGBA1C 11.8 (H) 05/19/2017   HGBA1C 8.5 (H) 11/17/2016   HGBA1C 9.5 (H) 07/18/2016   HGBA1C 10.8 (H) 02/16/2016   HGBA1C 14.2 (H) 10/13/2015   He was on: - Lantus 20 units 2x a day - NovoLog 10 units 3x a day, before meals Prev. On 70/30 until Spring 2020.  Now on: - Lantus 30 units a day 2x a day - NovoLog 10-15 units before meals (15 min before you eat) - occasionally takes it after eating - Victoza 1.8 mg daily before breakfast - started 01/2020 Ozempic was not covered by his insurance  He checks sugars 0-2 times a day - am: 50, ave 130 >> 163, 290, 329 >> 84 >> n/c - 2h after b'fast: n/c - before lunch: n/c >> 266 >> 104-187, 219 >> 60, 90, 120, 150-190 (fasting, wakes up late) - 2h after lunch: n/c >> 274 >> n/c - before dinner: n/c >> 134, 217 >> 151 - 2h after dinner: n/c - bedtime: n/c >>  285 >> 213, 336 >> 353 - nighttime: n/c >> HI >> 73-215 >> ? Lowest sugar was 50 >> 163 >> 73 >> 60; he has hypoglycemia awareness at 100.  Highest sugar was 400 >> HI >> 336 >> 353 (no NovoLog).  Glucometer: ReliOn  Pt's meals are: - Breakfast: skips - Lunch: may skip, PB or chicken - Dinner: chicken w/o sides or with + veggies + starch;  - Snacks: occasional sweets: Hershey bar, sweet tea, cool aid He drinks diet sodas and water.  -+ CKD stage IV - Dr Joshua [name], last BUN/creatinine:  10/18/2019: 24/2.92 Lab Results  Component Value Date   BUN 35 (H) 08/23/2019   BUN 38 (H) 08/22/2019   CREATININE 2.78 (H) 08/23/2019   CREATININE 3.01 (H) 08/22/2019   10/18/2019: ACR 765.3  Lab Results  Component Value Date   GFRAA 27 (L) 08/23/2019   GFRAA 25 (L) 08/22/2019   GFRAA 24 (L) 08/21/2019   GFRAA 22 (L) 08/20/2019   GFRAA 29 (L) 08/19/2019  He is not on ACE inhibitor/ARB  -+ HL; last set of lipids: 10/18/2019: 143/105/53/69 Lab Results  Component Value Date   CHOL 168 08/17/2019   HDL 85 08/17/2019   LDLCALC 71 08/17/2019   TRIG 60 08/17/2019   CHOLHDL 2.0 08/17/2019  On Crestor 40, Zetia 10.  - last eye exam was in 11/2019: No DR. Had  cataract sx in 2014, 2015.  -+ Numbness and tingling in his feet. He has severe L neuropathy.  Also, significant back pain.  On ASA 81.  Latest TSH: 06/09/2020: TSH 3.36  Pt has FH of DM in [place].  He also has a history of HTN, OSA, GERD, esophageal ulcers.  He denies h/o pancreatitis or FH of MTC.  He is in a play in [place].  ROS: Constitutional: no weight gain/+ weight loss, no fatigue, no subjective hyperthermia, no subjective hypothermia Eyes: no blurry vision, no xerophthalmia ENT: no sore throat, no nodules palpated in neck, no dysphagia, no odynophagia, no hoarseness Cardiovascular: no CP/+ SOB/no palpitations/no leg swelling Respiratory: no cough/+ SOB/no wheezing Gastrointestinal: no N/no V/no D/no C/+ acid  reflux Musculoskeletal: no muscle aches/+ joint aches Skin: no rashes, no hair loss Neurological: no tremors/+ numbness/+ tingling/no dizziness, + disequilibrium  I reviewed pt's medications, allergies, PMH, social hx, family hx, and changes were documented in the history of present illness. Otherwise, unchanged from my initial visit note.  Past Medical History:  Diagnosis Date  . Anemia   . Chronic combined systolic and diastolic CHF (congestive heart failure) ([place])    a. 07/2015 Echo: EF 45-50%, mod LVH, mid apical/antsept AK, Gr 1 DD.  [name] CKD (chronic kidney disease), stage III   . Coronary artery disease    a. 2012 s/p MI/PCI: s/p DES to mid/dist RCA and overlapping DES to LAD;  b. 07/2015 Cath: LAD 10% ISR, D1 40(jailed), LCX 71m OM2 30, OM3 30, RCA 545mSR, 10d ISR; c. 07/2016 NSTEMI/PCI: LAD 85ost/p/m (3.5x20 Synergy DES overlapping prior stent), D1 40, LCX 7575mM2 95 (2.75x16 Synergy DES), OM3 30, RCA 31m49md.  . Depression   . Diabetic gastroparesis (HCC)[place]. DKA (diabetic ketoacidoses) (HCC)[place]/01/2018  . GERD (gastroesophageal reflux disease)   . Gout   . Heart murmur   . Hyperlipemia   . Hypertensive heart disease   . Ischemic cardiomyopathy    a. 07/2015 Echo: EF 45-50%, mod LVH, mid-apicalanteroseptal AK, Gr1 DD.  . Morbid obesity (HCC)[place]. OSA on CPAP   . Osteomyelitis (HCC)[place] a. 07/2016 MTP joint of R great toe s/p transmetatarsal amputation.  . Polyneuropathy in diabetes(357.2)   . Pulmonary sarcoidosis (HCC)[place] "problems w/it years ago; no problems anymore" (07/03/2014)  . Rectal bleeding   . Sleep apnea   . Type II diabetes mellitus (HCC)[place] Past Surgical History:  Procedure Laterality Date  . AMPUTATION Right 07/24/2016   Procedure: TRANSMETATARSAL FOOT AMPUTATION;  Surgeon: Joshua [name];  [facility];  Laterality: Right;  . BALLOON DILATION N/A 03/21/2018   Procedure: BALLOON DILATION;  Surgeon: BuccRonald [name];  [facility];  Service: Endoscopy;  Laterality: N/A;  . BREAST LUMPECTOMY Right ~ 2000   "benign"  . CARDIAC CATHETERIZATION N/A 07/30/2015   Procedure: Left Heart Cath and Coronary Angiography;  Surgeon: ChriBurnell Blanks;  [facility] [place];  Service: Cardiovascular;  Laterality: N/A;  . CARDIAC CATHETERIZATION N/A 07/19/2016   Procedure: Left Heart Cath and Coronary Angiography;  Surgeon: HenrBelva Crome;  [facility];  Service: Cardiovascular;  Laterality: N/A;  . CARDIAC CATHETERIZATION N/A 07/29/2016   Procedure: Coronary Stent Intervention;  Surgeon: HenrBelva Crome;  [facility];  Service: Cardiovascular;  Laterality: N/A;  . CARDIAC CATHETERIZATION N/A 07/29/2016  Procedure: Intravascular Ultrasound/IVUS;  Surgeon: Belva Crome, MD;  Location: [place] CV LAB;  Service: Cardiovascular;  Laterality: N/A;  . CATARACT EXTRACTION W/ INTRAOCULAR LENS  IMPLANT, BILATERAL Bilateral 2014-2015  . COLONOSCOPY WITH PROPOFOL N/A 08/22/2017   Procedure: COLONOSCOPY WITH PROPOFOL;  Surgeon: [name], MD;  Location: [place];  Service: Endoscopy;  Laterality: N/A;  . CORONARY ANGIOPLASTY WITH STENT PLACEMENT  10/31/2011   30 % MID ATRIOVENTRICULAR GROOVE CX STENOSIS. 90 % MID RCA.PTA TO LAD/STENTING OF TANDEM 60-70%. LAD STENOSIS 99% REDUCED TO 0%.3.0 X 32MM PROMUS DES POSTDILATED TO 3.25MM.  [name] CORONARY ANGIOPLASTY WITH STENT PLACEMENT  07/03/2014   "2"  . CORONARY STENT INTERVENTION N/A 04/09/2019   Procedure: CORONARY STENT INTERVENTION;  Surgeon: Jettie Booze, MD;  Location: [place] CV LAB;  Service: Cardiovascular;  Laterality: N/A;  . CORONARY STENT INTERVENTION N/A 08/22/2019   Procedure: CORONARY STENT INTERVENTION;  Surgeon: Martinique, Peter M, MD;  Location: [place] CV LAB;  Service: Cardiovascular;  Laterality: N/A;  . ESOPHAGOGASTRODUODENOSCOPY (EGD) WITH PROPOFOL N/A 03/21/2018   Procedure:  ESOPHAGOGASTRODUODENOSCOPY (EGD) WITH PROPOFOL;  Surgeon: Joshua Lobo, MD;  Location: [place];  Service: Endoscopy;  Laterality: N/A;  . I & D EXTREMITY Right 10/30/2013   Procedure: IRRIGATION AND DEBRIDEMENT RIGHT SMALL FINGER POSSIBLE SECONDARY WOUND CLOSURE;  Surgeon: [name], MD;  [facility];  Service: Orthopedics;  Laterality: Right;  [name] is available after 4pm  . I & D EXTREMITY Right 11/01/2013   Procedure:  IRRIGATION AND DEBRIDEMENT RIGHT  PROXIMAL PHALANGEAL LEVEL AMPUTATION;  Surgeon: [name], MD;  [facility];  Service: Orthopedics;  Laterality: Right;  . INCISION AND DRAINAGE Right 10/28/2013   Procedure: INCISION AND DRAINAGE Right small finger;  Surgeon: [name], MD;  [facility];  Service: Orthopedics;  Laterality: Right;  . LEFT HEART CATH Left 11/03/2011   Procedure: LEFT HEART CATH;  Surgeon: [name], MD;  Location: [facility] CATH LAB;  Service: Cardiovascular;  Laterality: Left;  . LEFT HEART CATH AND CORONARY ANGIOGRAPHY N/A 04/09/2019   Procedure: LEFT HEART CATH AND CORONARY ANGIOGRAPHY;  Surgeon: Jettie Booze, MD;  Location: [place] CV LAB;  Service: Cardiovascular;  Laterality: N/A;  . LEFT HEART CATHETERIZATION WITH CORONARY ANGIOGRAM N/A 07/03/2014   Procedure: LEFT HEART CATHETERIZATION WITH CORONARY ANGIOGRAM;  Surgeon: [name], MD;  Location: [facility] C[facility] LAB;  Service: Cardiovascular;  Laterality: N/A;  . PERCUTANEOUS CORONARY STENT INTERVENTION (PCI-S) Left 11/03/2011   Procedure: PERCUTANEOUS CORONARY STENT INTERVENTION (PCI-S);  Surgeon: [name], MD;  [facility];  Service: Cardiovascular;  Laterality: Left;  . RIGHT/LEFT HEART CATH AND CORONARY ANGIOGRAPHY N/A 08/22/2019   Procedure: RIGHT/LEFT HEART CATH AND CORONARY ANGIOGRAPHY;  Surgeon: Martinique, Peter M, MD;  Location: [place] CV LAB;  Service: Cardiovascular;  Laterality: N/A;  . TOE AMPUTATION Right ~ 2010   "2nd  toe"  . TOE AMPUTATION Right 2012   "3rd toe"   Social History   Socioeconomic History  . Marital status: Married    Spouse name: Not on file  . Number of children: Not on file  . Years of education: Not on file  . Highest education level: Not on file  Occupational History  . Not on file  Tobacco Use  . Smoking status: Never Smoker  . Smokeless tobacco: Never Used  Vaping Use  . Vaping Use: Never used  Substance and Sexual Activity  . Alcohol  use: Yes    Comment: social   . Drug use: No  . Sexual activity: Yes  Other Topics Concern  . Not on file  Social History Narrative  . Not on file   Social Determinants of Health   Financial Resource Strain:   . Difficulty of Paying Living Expenses: Not on file  Food Insecurity:   . Worried About Charity fundraiser in the Last Year: Not on file  . Ran Out of Food in the Last Year: Not on file  Transportation Needs:   . Lack of Transportation (Medical): Not on file  . Lack of Transportation (Non-Medical): Not on file  Physical Activity:   . Days of Exercise per Week: Not on file  . Minutes of Exercise per Session: Not on file  Stress:   . Feeling of Stress : Not on file  Social Connections:   . Frequency of Communication with Friends and Family: Not on file  . Frequency of Social Gatherings with Friends and Family: Not on file  . Attends Religious Services: Not on file  . Active Member of Clubs or Organizations: Not on file  . Attends Archivist Meetings: Not on file  . Marital Status: Not on file  Intimate Partner Violence:   . Fear of Current or Ex-Partner: Not on file  . Emotionally Abused: Not on file  . Physically Abused: Not on file  . Sexually Abused: Not on file   Current Outpatient Medications on File Prior to Visit  Medication Sig Dispense Refill  . Accu-Chek FastClix Lancets MISC USE UP TO FOUR TIMES DAILY AS DIRECTED    . ACCU-CHEK GUIDE test strip USE UP TO FOUR TIMES DAILY AS DIRECTED 300 each 3   . acetaminophen (TYLENOL) 650 MG CR tablet Take 1,300 mg by mouth daily as needed for pain.    [name] allopurinol (ZYLOPRIM) 300 MG tablet Take 300 mg by mouth daily.     [name] amLODipine (NORVASC) 10 MG tablet Take 1 tablet (10 mg total) by mouth daily. 90 tablet 1  . aspirin EC 81 MG tablet Take 81 mg by mouth daily.     . blood glucose meter kit and supplies Dispense based on patient and insurance preference. Use up to four times daily as directed. (FOR ICD-10 E10.9, E11.9). 1 each 0  . ezetimibe (ZETIA) 10 MG tablet Take 1 tablet (10 mg total) by mouth daily. 90 tablet 3  . furosemide (LASIX) 80 MG tablet Take 80 mg by mouth. Takes 160 mg 3 days per week and 80 mg 4 days per week    . hydrALAZINE (APRESOLINE) 100 MG tablet Take 100 mg by mouth 3 (three) times daily.    . insulin aspart (NOVOLOG FLEXPEN) 100 UNIT/ML FlexPen Inject 10-15 Units into the skin 3 (three) times daily with meals. 15 mL 11  . insulin glargine (LANTUS) 100 UNIT/ML injection Inject 0.3 mLs (30 Units total) into the skin 2 (two) times daily. 40 mL 3  . isosorbide mononitrate (IMDUR) 120 MG 24 hr tablet TAKE 1 TABLET BY MOUTH TWICE DAILY HOLD UNTIL FOLLOW UP WITH DOCTOR 180 tablet 2  . liraglutide (VICTOZA) 18 MG/3ML SOPN Inject 0.3 mLs (1.8 mg total) into the skin daily. 9 mL 2  . metoprolol succinate (TOPROL-XL) 50 MG 24 hr tablet Take 3 tablets (150 mg total) by mouth daily. Take with or immediately following a meal. 90 tablet 0  . nitroGLYCERIN (NITROSTAT) 0.4 MG SL tablet Place 1 tablet (0.4 mg  total) under the tongue every 5 (five) minutes as needed for chest pain. 25 tablet 1  . pantoprazole (PROTONIX) 40 MG tablet TAKE 1 TABLET BY MOUTH TWICE DAILY BEFORE MEAL(S) 180 tablet 0  . potassium chloride (KLOR-CON) 20 MEQ packet Take by mouth 2 (two) times daily. Unknown amount (2 pills daily)    . rosuvastatin (CRESTOR) 40 MG tablet Take 1 tablet by mouth once daily 30 tablet 0  . ticagrelor (BRILINTA) 90 MG TABS tablet Take 1  tablet (90 mg total) by mouth 2 (two) times daily. 180 tablet 3   No current facility-administered medications on file prior to visit.   Allergies  Allergen Reactions  . Chlorthalidone Other (See Comments)    Causes dehydration, kidneys shut down   Family History  Problem Relation Age of Onset  . Heart attack Maternal Aunt   . Diabetes Maternal Grandmother   . Hypertension Maternal Grandmother     PE: BP 110/70   Pulse 92   Ht '5\' 9"'  (1.753 m)   Wt 291 lb (132 kg)   SpO2 97%   BMI 42.97 kg/m  Wt Readings from Last 3 Encounters:  08/15/20 291 lb (132 kg)  06/24/20 293 lb (132.9 kg)  06/09/20 (!) 300 lb 5 oz (136.2 kg)   Constitutional: overweight, in NAD Eyes: PERRLA, EOMI, no exophthalmos ENT: moist mucous membranes, no thyromegaly, no cervical lymphadenopathy Cardiovascular: RRR, No MRG, + LE edema Respiratory: CTA B Gastrointestinal: abdomen soft, NT, ND, BS+ Musculoskeletal: + deformities: Right transmetatarsal amputation, strength intact in all 4 Skin: moist, warm, no rashes Neurological: no tremor with outstretched hands, DTR normal in all 4  ASSESSMENT: 1. DM2, insulin-dependent, uncontrolled, with complications - CAD - s/p AMI 2012, s/p multiple PCI's; ischemic cardiomyopathy; CHF. Dr. Claiborne Billings - CKD stage 4 - PN - s/p R transmetatarsal amputation - Gastroparesis - History of DKA  PLAN:  1. Patient with longstanding, uncontrolled, type 2 diabetes, on basal-bolus insulin regimen and daily GLP-1 receptor agonist with improved control at last visit after slight improvement in diet.  His HbA1c at last visit was 8.9%, decreased from 13.8%. -He started to see nutrition since last visit and is working with a nutritionist in improving his meals.  He still eats takeout and skips meals but he is working on improving this. -We tried in the past to switch from Victoza to [place], which would have helped his sugars more, but was not it was not affordable. -Also, he could not  obtain a CGM due to the fact that he was not checking his sugars frequently enough -At last visit I strongly advised him to take the Humalog 15 minutes before every meal but did not change the doses of his insulins. -At this visit, sugars have improved but he is not checking them consistently and I strongly advised him to do so.  Also, he is skipping meals and, as a consequence, he is also skipping NovoLog with meals.  We discussed about bolusing a smaller amount of NovoLog if he has more consistent snack. -Otherwise, for now, will not change his regimen but at next visit, we will retry to start him on Ozempic.  This is a much better option for his heart disease and you will help even more with his weight. -At this visit, we also discussed about possibly retrying the CGM since Medicare and Medicaid eliminated the requirement for checking blood sugars 4 times a day - I suggested to:  Patient Instructions  Please continue: - Lantus 30  units a day 2x a day - NovoLog 10-15 units 15 minutes before meals - Victoza 1.8 mg daily before breakfast.  Please come back for a follow-up appointment in 3 months.  - we checked his HbA1c: 7.5% (much better) - advised to check sugars at different times of the day - 4x a day, rotating check times - advised for yearly eye exams >> he is UTD - return to clinic in 3 months  2. HL -Reviewed latest lipid panel from almost a year ago: All fractions at goal: 10/18/2019: 143/105/53/69 -Continues Crestor and Zetia without side effects  3.  Obesity class III -He could not start Ozempic due to price so he still only started on Victoza this should also help with weight loss, but not as much as Ozempic -He has large fluctuations in his weight which may be also due to fluid status - he lost 9 lbs since last OV -He saw nutrition since last OV  Reviewed notes.  He mentioned that his diet was worse, with more takeout, and was occasionally skipping meals until  dinner.  Philemon Kingdom, MD PhD [facility] Endocrinology

## 2020-08-15 NOTE — Patient Instructions (Addendum)
Please continue: - Lantus 30 units a day 2x a day - NovoLog 10-15 units 15 minutes before meals - Victoza 1.8 mg daily before breakfast.  Check sugars before meals and at bedtime.  Please come back for a follow-up appointment in 3 months.

## 2020-08-22 ENCOUNTER — Other Ambulatory Visit: Payer: Self-pay | Admitting: Internal Medicine

## 2020-08-26 ENCOUNTER — Other Ambulatory Visit: Payer: Self-pay

## 2020-08-26 ENCOUNTER — Encounter: Payer: Self-pay | Admitting: Medical

## 2020-08-26 ENCOUNTER — Ambulatory Visit (INDEPENDENT_AMBULATORY_CARE_PROVIDER_SITE_OTHER): Payer: Medicare Other | Admitting: Medical

## 2020-08-26 DIAGNOSIS — E1142 Type 2 diabetes mellitus with diabetic polyneuropathy: Secondary | ICD-10-CM

## 2020-08-26 DIAGNOSIS — I5042 Chronic combined systolic (congestive) and diastolic (congestive) heart failure: Secondary | ICD-10-CM

## 2020-08-26 DIAGNOSIS — Z9989 Dependence on other enabling machines and devices: Secondary | ICD-10-CM

## 2020-08-26 DIAGNOSIS — I10 Essential (primary) hypertension: Secondary | ICD-10-CM

## 2020-08-26 DIAGNOSIS — I25119 Atherosclerotic heart disease of native coronary artery with unspecified angina pectoris: Secondary | ICD-10-CM | POA: Diagnosis not present

## 2020-08-26 DIAGNOSIS — E785 Hyperlipidemia, unspecified: Secondary | ICD-10-CM

## 2020-08-26 DIAGNOSIS — N184 Chronic kidney disease, stage 4 (severe): Secondary | ICD-10-CM

## 2020-08-26 DIAGNOSIS — G4733 Obstructive sleep apnea (adult) (pediatric): Secondary | ICD-10-CM

## 2020-08-26 MED ORDER — ISOSORBIDE MONONITRATE ER 120 MG PO TB24: 120.0000 mg | ORAL_TABLET | Freq: Every day | ORAL | 3 refills | Status: DC

## 2020-08-26 NOTE — Progress Notes (Signed)
Cardiology Office Note   Date:  08/27/2020   ID:  Joshua [name], DOB October 22, 1956, MRN [id number]  PCP:  [name], MD  Cardiologist:  [name], MD EP: None  Chief Complaint  Patient presents with  . Follow-up    CAD      History of Present Illness: Joshua [name] is a 64 y.o. male with PMH of CAD s/p PCI/DES to mLAD and RCA in 2012 with subsequent PCI/DES to pLAD and LCx in 2017 and PCI to pLAD for ISR 03/2019 and PCI/DES to pLAD 08/2019, chronic combined CHF, HTN, HLD, DM type 2, osteomyelitis s/p right metatarsal amputation in 2017, GERD, CKD stage 4, OSA, and obesity, who presents for routine follow-up.  He was last evaluated by cardiology at an outpatient visit with Dr. Claiborne Billings 02/2020, at which time he was doing fine from a cardiac standpoint. No exertional complaints. No medication changes occurred and he was recommended to follow-up in 6 months.  His last ischemic evaluation was a [facility] 08/2019 which showed 95% pLAD stenosis managed with PCI/DES, as well as 40%, 1st diagonal stenosis, 10% dLAD stenosis, 30% 3rd marginal stenosis, 50% mLCx stenosis, 45% p-mRCA stenosis, 25% mRCA stenosis, and 30% RPDA stenosis which were medically managed, as well as mild pulmHTN. His last echocardiogram 03/2019 showed EF 55-60%, indeterminate LV diastolic function, suboptimal acoustic windows limited evaluation of wall motion abnormalities, and no significant valvular abnormalities.   He presents today for routine follow-up. He has been doing fairly well from a cardiac standpoint since his last visit. He continues to have DOE which is perhaps somewhat improved from his last visit. No chest pain complaints. He was out of his imdur for a couple months without issues, recently restarted 120m daily. He has been losing weight; down 30+lbs with dietary changes. Encouraged to keep up the good work. More recently he has been following with neurology for the evaluation of his neuropathy which has been  limiting his activity recently due to his gait instability. Volume status has been stable - lasix is managed by his nephrologist. He has some sensations of swimmy-headedness upon waking in the morning but no significant dizziness, lightheadedness, or syncope. He has also been struggling with his CPAP mask - reports it has been leaking and therefore he has not been compliant with CPAP use over the past couple months. 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      Past Medical History:  Diagnosis Date  . Anemia   . Chronic combined systolic and diastolic CHF (congestive heart failure) ([place]    a. 07/2015 Echo: EF 45-50%, mod LVH, mid apical/antsept AK, Gr 1 DD.  .[name] KitchenCKD (chronic kidney disease), stage III   . Coronary artery disease    a. 2012 s/p MI/PCI: s/p DES to mid/dist RCA and overlapping DES to LAD;  b. 07/2015 Cath: LAD 10% ISR, D1 40(jailed), LCX 637mOM2 30, OM3 30, RCA 5021mR, 10d ISR; c. 07/2016 NSTEMI/PCI: LAD 85ost/p/m (3.5x20 Synergy DES overlapping prior stent), D1 40, LCX 53m1m2 95 (2.75x16 Synergy DES), OM3 30, RCA 20m,53m.  . Depression   . Diabetic gastroparesis (HCC) [place] DKA (diabetic ketoacidoses) (HCC) Vivian02/2019  . GERD (gastroesophageal reflux disease)   . Gout   . Heart murmur   . Hyperlipemia   . Hypertensive heart disease   . Ischemic cardiomyopathy  a. 07/2015 Echo: EF 45-50%, mod LVH, mid-apicalanteroseptal AK, Gr1 DD.  . Morbid obesity ([place])   . OSA on CPAP   . Osteomyelitis ([place])    a. 07/2016 MTP joint of R great toe s/p transmetatarsal amputation.  . Polyneuropathy in diabetes(357.2)   . Pulmonary sarcoidosis ([place])    "problems w/it years ago; no problems anymore" (07/03/2014)  . Rectal bleeding   . Sleep apnea   . Type II diabetes mellitus ([place])     Past Surgical History:  Procedure Laterality Date  . AMPUTATION Right 07/24/2016   Procedure: TRANSMETATARSAL  FOOT AMPUTATION;  Surgeon: [name], MD;  Location: [place];  Service: Orthopedics;  Laterality: Right;  . BALLOON DILATION N/A 03/21/2018   Procedure: BALLOON DILATION;  Surgeon: [name], MD;  Location: [facility] ENDOSCOPY;  Service: Endoscopy;  Laterality: N/A;  . BREAST LUMPECTOMY Right ~ 2000   "benign"  . CARDIAC CATHETERIZATION N/A 07/30/2015   Procedure: Left Heart Cath and Coronary Angiography;  Surgeon: Burnell Blanks, MD;  Location: [place] CV LAB;  Service: Cardiovascular;  Laterality: N/A;  . CARDIAC CATHETERIZATION N/A 07/19/2016   Procedure: Left Heart Cath and Coronary Angiography;  Surgeon: Belva Crome, MD;  Location: [place] CV LAB;  Service: Cardiovascular;  Laterality: N/A;  . CARDIAC CATHETERIZATION N/A 07/29/2016   Procedure: Coronary Stent Intervention;  Surgeon: Belva Crome, MD;  Location: [place] CV LAB;  Service: Cardiovascular;  Laterality: N/A;  . CARDIAC CATHETERIZATION N/A 07/29/2016   Procedure: Intravascular Ultrasound/IVUS;  Surgeon: Belva Crome, MD;  Location: [place] CV LAB;  Service: Cardiovascular;  Laterality: N/A;  . CATARACT EXTRACTION W/ INTRAOCULAR LENS  IMPLANT, BILATERAL Bilateral 2014-2015  . COLONOSCOPY WITH PROPOFOL N/A 08/22/2017   Procedure: COLONOSCOPY WITH PROPOFOL;  Surgeon: [name], MD;  Location: [place];  Service: Endoscopy;  Laterality: N/A;  . CORONARY ANGIOPLASTY WITH STENT PLACEMENT  10/31/2011   30 % MID ATRIOVENTRICULAR GROOVE CX STENOSIS. 90 % MID RCA.PTA TO LAD/STENTING OF TANDEM 60-70%. LAD STENOSIS 99% REDUCED TO 0%.3.0 X 32MM PROMUS DES POSTDILATED TO 3.25MM.  [name] CORONARY ANGIOPLASTY WITH STENT PLACEMENT  07/03/2014   "2"  . CORONARY STENT INTERVENTION N/A 04/09/2019   Procedure: CORONARY STENT INTERVENTION;  Surgeon: Jettie Booze, MD;  Location: [place] CV LAB;  Service: Cardiovascular;  Laterality: N/A;  . CORONARY STENT INTERVENTION N/A 08/22/2019   Procedure: CORONARY STENT  INTERVENTION;  Surgeon: Martinique, Peter M, MD;  Location: [place] CV LAB;  Service: Cardiovascular;  Laterality: N/A;  . ESOPHAGOGASTRODUODENOSCOPY (EGD) WITH PROPOFOL N/A 03/21/2018   Procedure: ESOPHAGOGASTRODUODENOSCOPY (EGD) WITH PROPOFOL;  Surgeon: [name], MD;  Location: [place];  Service: Endoscopy;  Laterality: N/A;  . I & D EXTREMITY Right 10/30/2013   Procedure: IRRIGATION AND DEBRIDEMENT RIGHT SMALL FINGER POSSIBLE SECONDARY WOUND CLOSURE;  Surgeon: [name], MD;  Location: [facility];  Service: Orthopedics;  Laterality: Right;  [name] is available after 4pm  . I & D EXTREMITY Right 11/01/2013   Procedure:  IRRIGATION AND DEBRIDEMENT RIGHT  PROXIMAL PHALANGEAL LEVEL AMPUTATION;  Surgeon: [name], MD;  Location: [facility];  Service: Orthopedics;  Laterality: Right;  . INCISION AND DRAINAGE Right 10/28/2013   Procedure: INCISION AND DRAINAGE Right small finger;  Surgeon: [name], MD;  Location: [facility];  Service: Orthopedics;  Laterality: Right;  . LEFT HEART CATH Left 11/03/2011   Procedure: LEFT HEART CATH;  Surgeon: [name], MD;  Location: [facility] CATH  LAB;  Service: Cardiovascular;  Laterality: Left;  . LEFT HEART CATH AND CORONARY ANGIOGRAPHY N/A 04/09/2019   Procedure: LEFT HEART CATH AND CORONARY ANGIOGRAPHY;  Surgeon: Jettie Booze, MD;  Location: [place] CV LAB;  Service: Cardiovascular;  Laterality: N/A;  . LEFT HEART CATHETERIZATION WITH CORONARY ANGIOGRAM N/A 07/03/2014   Procedure: LEFT HEART CATHETERIZATION WITH CORONARY ANGIOGRAM;  Surgeon: [name], MD;  Location: [facility] CATH LAB;  Service: Cardiovascular;  Laterality: N/A;  . PERCUTANEOUS CORONARY STENT INTERVENTION (PCI-S) Left 11/03/2011   Procedure: PERCUTANEOUS CORONARY STENT INTERVENTION (PCI-S);  Surgeon: [name], MD;  Location: [facility] CATH LAB;  Service: Cardiovascular;  Laterality: Left;  . RIGHT/LEFT HEART CATH AND CORONARY ANGIOGRAPHY N/A 08/22/2019    Procedure: RIGHT/LEFT HEART CATH AND CORONARY ANGIOGRAPHY;  Surgeon: Martinique, Peter M, MD;  Location: [place] CV LAB;  Service: Cardiovascular;  Laterality: N/A;  . TOE AMPUTATION Right ~ 2010   "2nd toe"  . TOE AMPUTATION Right 2012   "3rd toe"     Current Outpatient Medications  Medication Sig Dispense Refill  . Accu-Chek FastClix Lancets MISC USE UP TO FOUR TIMES DAILY AS DIRECTED    . ACCU-CHEK GUIDE test strip USE UP TO FOUR TIMES DAILY AS DIRECTED 300 each 3  . acetaminophen (TYLENOL) 650 MG CR tablet Take 1,300 mg by mouth daily as needed for pain.    [name] allopurinol (ZYLOPRIM) 300 MG tablet Take 300 mg by mouth daily.     [name] amLODipine (NORVASC) 10 MG tablet Take 1 tablet (10 mg total) by mouth daily. 90 tablet 1  . aspirin EC 81 MG tablet Take 81 mg by mouth daily.     . blood glucose meter kit and supplies Dispense based on patient and insurance preference. Use up to four times daily as directed. (FOR ICD-10 E10.9, E11.9). 1 each 0  . ezetimibe (ZETIA) 10 MG tablet Take 1 tablet (10 mg total) by mouth daily. 90 tablet 3  . furosemide (LASIX) 80 MG tablet Take 80 mg by mouth. Takes 160 mg 3 days per week and 80 mg 4 days per week    . hydrALAZINE (APRESOLINE) 100 MG tablet Take 100 mg by mouth 3 (three) times daily.    . insulin aspart (NOVOLOG FLEXPEN) 100 UNIT/ML FlexPen Inject 10-15 Units into the skin 3 (three) times daily with meals. 15 mL 11  . insulin glargine (LANTUS) 100 UNIT/ML injection Inject 0.3 mLs (30 Units total) into the skin 2 (two) times daily. 40 mL 3  . isosorbide mononitrate (IMDUR) 120 MG 24 hr tablet Take 1 tablet (120 mg total) by mouth daily. 90 tablet 3  . metoprolol succinate (TOPROL-XL) 50 MG 24 hr tablet Take 3 tablets (150 mg total) by mouth daily. Take with or immediately following a meal. 90 tablet 0  . nitroGLYCERIN (NITROSTAT) 0.4 MG SL tablet Place 1 tablet (0.4 mg total) under the tongue every 5 (five) minutes as needed for chest pain. 25 tablet 1   . pantoprazole (PROTONIX) 40 MG tablet TAKE 1 TABLET BY MOUTH TWICE DAILY BEFORE MEAL(S) 180 tablet 0  . potassium chloride (KLOR-CON) 20 MEQ packet Take by mouth 2 (two) times daily. Unknown amount (2 pills daily)    . rosuvastatin (CRESTOR) 40 MG tablet Take 1 tablet by mouth once daily 30 tablet 0  . ticagrelor (BRILINTA) 90 MG TABS tablet Take 1 tablet (90 mg total) by mouth 2 (two) times daily. 180 tablet 3  . VICTOZA 18 MG/3ML  SOPN INJECT 0.3 MLS (1.8MG TOTAL) INTO THE SKIN DAILY 9 mL 0   No current facility-administered medications for this visit.    Allergies:   Chlorthalidone    Social History:  The patient  reports that he has never smoked. He has never used smokeless tobacco. He reports current alcohol use. He reports that he does not use drugs.   Family History:  The patient's family history includes Diabetes in his maternal grandmother; Heart attack in his maternal aunt; Hypertension in his maternal grandmother.    ROS:  Please see the history of present illness.   Otherwise, review of systems are positive for none.   All other systems are reviewed and negative.    PHYSICAL EXAM: VS:  BP 136/78   Pulse 86   Ht _0  (1.753 m)   Wt 293 lb (132.9 kg)   SpO2 96%   BMI 43.27 kg/m  , BMI Body mass index is 43.27 kg/m. GEN: Well nourished, well developed, in no acute distress HEENT: sclera anicteric Neck: no JVD, carotid bruits, or masses Cardiac: RRR; no murmurs, rubs, or gallops, no edema  Respiratory:  clear to auscultation bilaterally, normal work of breathing GI: soft, nontender, nondistended, + BS MS: no deformity or atrophy Skin: warm and dry, no rash Neuro:  Strength and sensation are intact Psych: euthymic mood, full affect   EKG:  EKG is not ordered today.   Recent Labs: 06/09/2020: TSH 3.360    Lipid Panel    Component Value Date/Time   CHOL 168 08/17/2019 0402   TRIG 60 08/17/2019 0402   HDL 85 08/17/2019 0402   CHOLHDL 2.0 08/17/2019 0402    VLDL 12 08/17/2019 0402   LDLCALC 71 08/17/2019 0402      Wt Readings from Last 3 Encounters:  08/26/20 293 lb (132.9 kg)  08/15/20 291 lb (132 kg)  06/24/20 293 lb (132.9 kg)      Other studies Reviewed: Additional studies/ records that were reviewed today include:   Echocardiogram 03/2019: 1. The left ventricle has normal systolic function, with an ejection  fraction of 55-60%. The cavity size was normal. Indeterminate diastolic  filling due to E-A fusion.  2. LV regional wall motion abnormalities could not be completely assessed  due to suboptimal acoustic windows. Grossly normal wall motion.  3. The inferior vena cava was dilated in size with <50% respiratory  Variability  Left heart catheterization 03/2019:  Previously placed Mid LAD drug eluting stent is widely patent.  Ost 1st Diag to 1st Diag lesion is 40% stenosed.  Dist LAD lesion is 10% stenosed.  Previously placed 2nd Mrg drug eluting stent is widely patent.  3rd Mrg lesion is 30% stenosed.  Mid Cx lesion is 50% stenosed.  RPDA lesion is 30% stenosed.  Prox RCA to Mid RCA lesion is 40% stenosed.  Mid RCA lesion is 25% stenosed.  LV end diastolic pressure is normal.  There is no aortic valve stenosis.  Ost LAD to Prox LAD lesion is 90% stenosed. In stent restenosis.  Scoring balloon angioplasty was performed using a BALLOON [place] 3.75X10, followed by 4.0 [place] balloon.  Prox LAD lesion is 90% stenosed.  Post intervention, there is a 0% residual stenosis.   Continue IV fluids post procedure.  He will need aggressive secondary prevention.    I stressed the importance of taking Brilinta 2x/day.  He admits to occasionally missing the second dose due to cost.  He states Plavix was stopped in the past, but he  is not sure of the specifics.   Possible discharge later in the day.     Right/Left heart catheterization 08/2019:  Prox LAD lesion is 95% stenosed.  A drug-eluting stent was successfully  placed using a STENT SYNERGY DES 3.5X20.  Post intervention, there is a 0% residual stenosis.  Ost 1st Diag to 1st Diag lesion is 40% stenosed.  Dist LAD lesion is 10% stenosed.  Non-stenotic 2nd Mrg lesion was previously treated.  3rd Mrg lesion is 30% stenosed.  Mid Cx lesion is 50% stenosed.  RPDA lesion is 30% stenosed.  Prox RCA to Mid RCA lesion is 45% stenosed.  Mid RCA lesion is 25% stenosed.  LV end diastolic pressure is normal.  Hemodynamic findings consistent with mild pulmonary hypertension.   1. Single vessel obstructive CAD with restenosis in the proximal LAD stent.  2. Successful PCI of the proximal LAD with DES x 1 3. Mild pulmonary HTN 4. Normal LV filling pressures 5. Procedure with 50 cc of contrast used  Plan: DAPT indefinitely. Anticipate DC home in am. Diagnostic Dominance: Right  Intervention        ASSESSMENT AND PLAN:   1. CAD s/p multiple PCI/DES: most recently underwent PCI/DES to pLAD for ISR 08/2019 after failed balloon angioplasty 03/2019. Also with PCI to mLAD, RCA, and LCx. No anginal complaints.  - Continue imdur - recommend 136m daily (previously on 1267mBID, though has been out for 2 months and recently restarted on 12036maily without recurrent angina).  - Continue aspirin and brilinta - Continue amlodipine and metoprolol - Continue statin and zetia  2. Chronic combined CHF: EF normalized on last echo 03/2019. No volume overload complaints and he appears euvolemic on exam.  - Continue metoprolol, hydralazine, imdur, and lasix  3. HTN: BP 136/78 - Continue amlodipine, hydralazine, metoprolol, imdur, and lasix  4. DM type 2: A1C 7.5 08/15/20, significantly improved from A1C of 15 03/2019. He has been dieting and recently lost 30+lbs - Continue insulin and victoza per PCP  5. HLD: LDL 71 08/2019 - Will have him come back for CMET/FLP at his convenience - if LDL remains above goal, will refer to lipid clinic for PSK9-inhibitor  given the extent of his CAD. - Continue crestor and zetia  6. CKD stage 4: follows with nephrology - Continue to limit nephrotoxic agents and close follow-up with nephrology  7. OSA: having trouble with his CPAP mask recently - Will send a message to WanBarry Brunner follow-up on his CPAP issues   Current medicines are reviewed at length with the patient today.  The patient does not have concerns regarding medicines.  The following changes have been made:  As above  Labs/ tests ordered today include:  No orders of the defined types were placed in this encounter.    Disposition:   FU with Dr. KelClaiborne Billings 6 months  Signed, KriAbigail ButtsA-C  08/27/2020 11:06 PM

## 2020-08-26 NOTE — Patient Instructions (Signed)
Medication Instructions:   TAKE IMDUR 120 MG DAILY *If you need a refill on your cardiac medications before your next appointment, please call your pharmacy*  Lab Work: NONE ordered at this time of appointment   If you have labs (blood work) drawn today and your tests are completely normal, you will receive your results only by: [name] MyChart Message (if you have MyChart) OR . A paper copy in the mail If you have any lab test that is abnormal or we need to change your treatment, we will call you to review the results.  Testing/Procedures: NONE ordered at this time of appointment   Follow-Up: At [facility], you and your health needs are our priority.  As part of our continuing mission to provide you with exceptional heart care, we have created designated Provider Care Teams.  These Care Teams include your primary Cardiologist (physician) and Advanced Practice Providers (APPs -  Physician Assistants and Nurse Practitioners) who all work together to provide you with the care you need, when you need it.  Your next appointment:   6 month(s)  The format for your next appointment:   In Person  Provider:   [name], MD  Other Instructions

## 2020-08-27 ENCOUNTER — Encounter: Payer: Self-pay | Admitting: Medical

## 2020-09-08 ENCOUNTER — Telehealth: Payer: Self-pay | Admitting: Neurology

## 2020-09-08 NOTE — Telephone Encounter (Signed)
Pt called me back and we reviewed Dr. Tori Milks recommendation. He verbalized understanding he is in agreement to discussing referral to the spine specialist with PCP and f/u with [place] in 2 months ( I have scheduled).  Pt reports Dr. Jannifer Franklin reviewed the results of the NCS/EMG at the time of the study with him.

## 2020-09-08 NOTE — Telephone Encounter (Signed)
This patient had EMG and nerve conduction velocity testing in early August.  In my absence, as I was on vacation at the time, he should have received test results by phone call but I do not see any phone message.  Please call patient and advise him that his EMG and nerve conduction velocity test in early August showed evidence of neuropathy in the severe range, very likely secondary to his underlying diabetes.  I do apologize if he did not get a result phone call from [place].  There was no significant evidence of any pinched nerve from the back.  If he has continued issues with back pain, he may benefit from seeing a spine specialist, I would encourage him to talk to his primary care physician about it referral if the need arises.  During our appointment I did not suggest any new medications.  We can follow him clinically.  Please offer follow-up appointment to see one of our nurse practitioners in about 2 to 3 months.

## 2020-09-08 NOTE — Telephone Encounter (Signed)
I called pt. No answer, left a message asking pt to call me back.   

## 2020-09-15 ENCOUNTER — Other Ambulatory Visit: Payer: Self-pay

## 2020-09-15 ENCOUNTER — Encounter: Payer: Medicare Other | Attending: Internal Medicine | Admitting: Dietician

## 2020-09-15 DIAGNOSIS — E1142 Type 2 diabetes mellitus with diabetic polyneuropathy: Secondary | ICD-10-CM | POA: Diagnosis present

## 2020-09-15 NOTE — Patient Instructions (Addendum)
Plan: Be sure that you are drinking adequate hydration (water is a great choice, mio and diet (sprite) soda can be fine). Consistency as much as possible. 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 Find ways to add more vegetables Aim to take your Novolog 15 minutes before meals.

## 2020-09-15 NOTE — Progress Notes (Signed)
Medical Nutrition Therapy:  Appt start time: 1400 end time:  1430.   Assessment:  Primary concerns today: .  Patient is here today alone.  He was last seen by myself 07/21/2020.   Patient states that he had neurological testing with severe neuropathy.  States that MD thinks he may have a pinched nerve coming from his back. Cyst on Testicle has increased in size that also will be evaluated. States that eating continues to be erratic and doesn't think that he is eating enough. Positive fluid retention.  These things are causing him stress. 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 Increased walking due to jobs. Dizzy at times.  Balance off.  Increased fatigue.  States that he feels that his brain is not talking to his hands and feet correctly.  Blood glucose has been 150-180 during these episodes. Reports almost constant headache He has been trying to check his blood sugar more frequently but states that this is still inconsistent.   Dexcom no longer requires blood sugar monitoring to meet requirements for their product.  Provided a Dexcom brochure. Not sleeping well.  Just got a new C-pap mask which doesn't always fit correctly. Intake remains inconsistent and poor.  His wife lost her job and they are trying to cook more.  He is eating more vegetables. Drinking less alcohol as it is not tasting "right".  History includes Type 2 diabetes, CHF, CKD4, gastroparesis, OSA with new mask (which he is unable to use). Medications include Lantus 30 units bid and Novolog about 12 units before meals A1C 8.9% 05/15/20 decreased from 13.8% 01/28/2020.  Weight hx: 293 lbs 08/26/2020 297 lbs 07/21/2020 300 lbs 06/03/2020 309 lbs 05/15/2020 (increased due to fluid) 306 lbs 05/06/2020 312 lbs 04/10/2020 (not eating much, presumed fluid gain based on examination) 300 lbs3/25/2021 319 lbs 01/28/20 304 lbs 04/2019 323 lbs about 02/14/20 highest resent weight 195 lbs lowest adult weight freshman in college during  increased football practice  Patient lives with his wife and his son.  His son is paying the mortgage.  His wife has mobility issus.  He walks with a cane.  Shopping causes him increased physical pain.  He finds it difficult to meet his own nutritional needs as well as the preferences of his family and overall is not cooking. 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 He is on disability. Middle son has type 2 diabetes and recently diagnosed. States that his oldest son uses alcohol and they encourage each other in this habit.  Preferred Learning Style:   No preference indicated   Learning Readiness:   Contemplating  DIETARY INTAKE:  24-hr recall:  4 pm yesterday:   leftover pork (high fat, 1 cup high sodium potatoes, 1/3 cup rice and sausage Last night:  1 slice pizza and 7-8 tortilla chips with queso Thus far today:  6-7 grapes, 1/2 cup cantelope, 2 cubes watermelon Beverages: water, diet soda (sprite), low fat milk, diet gatorade, mio  Usual physical activity: walking  Progress Towards Goal(s):  In progress.   Nutritional Diagnosis:  NB-1.1 Food and nutrition-related knowledge deficit As related to balance of carbohydrate, protein, and fat.  As evidenced by diet hx and patient report.    Intervention:  Nutrition counseling/education related to eating habits to increase nutrition quality continued.  Recommended increasing his vegetable intake, continuing to drink water rather than diet soda.  Discussed  recommendation of a regular meal schedule.   Recommended taking his Novolog prior to his meal and eat sitting down in the kitchen or dining room if possible. Mr. Tabora verbalized that he does not feel that he can be more consistent with his blood sugar management than he is.  Encouraged self care and as much consistency as he is able. He is his quarterback.   We discussed Dexcom as he  is inconsistent with his blood glucose monitoring.  Plan: Be sure that you are drinking adequate hydration (water is a great choice, mio and diet (sprite) soda can be fine). Consistency as much as possible. 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 Find ways to add more vegetables Aim to take your Novolog before meals.   Teaching Method Utilized:  Auditory  Handouts given during visit include:  Food Resource page (for free produce/grocery pick up locations)  Barriers to learning/adherence to lifestyle change: finances, social, health, motivation  Demonstrated degree of understanding via:  Teach Back   Monitoring/Evaluation:  Dietary intake, exercise, and body weight with follow up in 3 months.[name]

## 2020-09-24 ENCOUNTER — Encounter (HOSPITAL_COMMUNITY): Payer: Self-pay | Admitting: Emergency Medicine

## 2020-09-24 ENCOUNTER — Emergency Department (HOSPITAL_COMMUNITY): Payer: Medicare Other

## 2020-09-24 ENCOUNTER — Other Ambulatory Visit: Payer: Self-pay

## 2020-09-24 ENCOUNTER — Inpatient Hospital Stay (HOSPITAL_COMMUNITY)
Admission: EM | Admit: 2020-09-24 | Discharge: 2020-09-28 | DRG: 683 | Disposition: A | Discharge status: Home / Self Care | Payer: Medicare Other | Attending: Internal Medicine | Admitting: Internal Medicine

## 2020-09-24 DIAGNOSIS — I130 Hypertensive heart and chronic kidney disease with heart failure and stage 1 through stage 4 chronic kidney disease, or unspecified chronic kidney disease: Secondary | ICD-10-CM | POA: Diagnosis present

## 2020-09-24 DIAGNOSIS — Z8249 Family history of ischemic heart disease and other diseases of the circulatory system: Secondary | ICD-10-CM

## 2020-09-24 DIAGNOSIS — E785 Hyperlipidemia, unspecified: Secondary | ICD-10-CM | POA: Diagnosis present

## 2020-09-24 DIAGNOSIS — Z955 Presence of coronary angioplasty implant and graft: Secondary | ICD-10-CM | POA: Diagnosis not present

## 2020-09-24 DIAGNOSIS — E1165 Type 2 diabetes mellitus with hyperglycemia: Secondary | ICD-10-CM

## 2020-09-24 DIAGNOSIS — N1832 Chronic kidney disease, stage 3b: Secondary | ICD-10-CM | POA: Diagnosis present

## 2020-09-24 DIAGNOSIS — I252 Old myocardial infarction: Secondary | ICD-10-CM

## 2020-09-24 DIAGNOSIS — M109 Gout, unspecified: Secondary | ICD-10-CM | POA: Diagnosis present

## 2020-09-24 DIAGNOSIS — I2510 Atherosclerotic heart disease of native coronary artery without angina pectoris: Secondary | ICD-10-CM | POA: Diagnosis present

## 2020-09-24 DIAGNOSIS — K3184 Gastroparesis: Secondary | ICD-10-CM | POA: Diagnosis present

## 2020-09-24 DIAGNOSIS — R7989 Other specified abnormal findings of blood chemistry: Secondary | ICD-10-CM

## 2020-09-24 DIAGNOSIS — E1142 Type 2 diabetes mellitus with diabetic polyneuropathy: Secondary | ICD-10-CM | POA: Diagnosis present

## 2020-09-24 DIAGNOSIS — K219 Gastro-esophageal reflux disease without esophagitis: Secondary | ICD-10-CM | POA: Diagnosis present

## 2020-09-24 DIAGNOSIS — E119 Type 2 diabetes mellitus without complications: Secondary | ICD-10-CM

## 2020-09-24 DIAGNOSIS — N179 Acute kidney failure, unspecified: Secondary | ICD-10-CM

## 2020-09-24 DIAGNOSIS — E11649 Type 2 diabetes mellitus with hypoglycemia without coma: Secondary | ICD-10-CM | POA: Diagnosis not present

## 2020-09-24 DIAGNOSIS — I152 Hypertension secondary to endocrine disorders: Secondary | ICD-10-CM | POA: Diagnosis present

## 2020-09-24 DIAGNOSIS — G4733 Obstructive sleep apnea (adult) (pediatric): Secondary | ICD-10-CM | POA: Diagnosis present

## 2020-09-24 DIAGNOSIS — Z6841 Body Mass Index (BMI) 40.0 and over, adult: Secondary | ICD-10-CM

## 2020-09-24 DIAGNOSIS — E1143 Type 2 diabetes mellitus with diabetic autonomic (poly)neuropathy: Secondary | ICD-10-CM | POA: Diagnosis present

## 2020-09-24 DIAGNOSIS — E876 Hypokalemia: Secondary | ICD-10-CM

## 2020-09-24 DIAGNOSIS — D860 Sarcoidosis of lung: Secondary | ICD-10-CM | POA: Diagnosis present

## 2020-09-24 DIAGNOSIS — Z794 Long term (current) use of insulin: Secondary | ICD-10-CM

## 2020-09-24 DIAGNOSIS — E1122 Type 2 diabetes mellitus with diabetic chronic kidney disease: Secondary | ICD-10-CM | POA: Diagnosis present

## 2020-09-24 DIAGNOSIS — E7800 Pure hypercholesterolemia, unspecified: Secondary | ICD-10-CM | POA: Diagnosis present

## 2020-09-24 DIAGNOSIS — I10 Essential (primary) hypertension: Secondary | ICD-10-CM | POA: Diagnosis present

## 2020-09-24 DIAGNOSIS — E875 Hyperkalemia: Secondary | ICD-10-CM | POA: Diagnosis present

## 2020-09-24 DIAGNOSIS — E1159 Type 2 diabetes mellitus with other circulatory complications: Secondary | ICD-10-CM | POA: Diagnosis present

## 2020-09-24 DIAGNOSIS — I255 Ischemic cardiomyopathy: Secondary | ICD-10-CM | POA: Diagnosis present

## 2020-09-24 DIAGNOSIS — N183 Chronic kidney disease, stage 3 unspecified: Secondary | ICD-10-CM | POA: Diagnosis not present

## 2020-09-24 DIAGNOSIS — R5381 Other malaise: Secondary | ICD-10-CM

## 2020-09-24 DIAGNOSIS — Z833 Family history of diabetes mellitus: Secondary | ICD-10-CM

## 2020-09-24 DIAGNOSIS — I5042 Chronic combined systolic (congestive) and diastolic (congestive) heart failure: Secondary | ICD-10-CM | POA: Diagnosis present

## 2020-09-24 DIAGNOSIS — E118 Type 2 diabetes mellitus with unspecified complications: Secondary | ICD-10-CM

## 2020-09-24 DIAGNOSIS — Z7902 Long term (current) use of antithrombotics/antiplatelets: Secondary | ICD-10-CM

## 2020-09-24 DIAGNOSIS — T502X5A Adverse effect of carbonic-anhydrase inhibitors, benzothiadiazides and other diuretics, initial encounter: Secondary | ICD-10-CM | POA: Diagnosis present

## 2020-09-24 DIAGNOSIS — Z888 Allergy status to other drugs, medicaments and biological substances status: Secondary | ICD-10-CM

## 2020-09-24 DIAGNOSIS — Z7982 Long term (current) use of aspirin: Secondary | ICD-10-CM

## 2020-09-24 DIAGNOSIS — Z20822 Contact with and (suspected) exposure to covid-19: Secondary | ICD-10-CM | POA: Diagnosis present

## 2020-09-24 DIAGNOSIS — N184 Chronic kidney disease, stage 4 (severe): Secondary | ICD-10-CM | POA: Diagnosis present

## 2020-09-24 DIAGNOSIS — E66813 Obesity, class 3: Secondary | ICD-10-CM | POA: Diagnosis present

## 2020-09-24 LAB — COMPREHENSIVE METABOLIC PANEL
ALT: 227 U/L — ABNORMAL HIGH (ref 0–44)
AST: 202 U/L — ABNORMAL HIGH (ref 15–41)
Albumin: 3.3 g/dL — ABNORMAL LOW (ref 3.5–5.0)
Alkaline Phosphatase: 172 U/L — ABNORMAL HIGH (ref 38–126)
Anion gap: 18 — ABNORMAL HIGH (ref 5–15)
BUN: 71 mg/dL — ABNORMAL HIGH (ref 8–23)
CO2: 21 mmol/L — ABNORMAL LOW (ref 22–32)
Calcium: 8.7 mg/dL — ABNORMAL LOW (ref 8.9–10.3)
Chloride: 94 mmol/L — ABNORMAL LOW (ref 98–111)
Creatinine, Ser: 5.55 mg/dL — ABNORMAL HIGH (ref 0.61–1.24)
GFR, Estimated: 10 mL/min — ABNORMAL LOW (ref >60)
Glucose, Bld: 334 mg/dL — ABNORMAL HIGH (ref 70–99)
Potassium: 2.9 mmol/L — ABNORMAL LOW (ref 3.5–5.1)
Sodium: 133 mmol/L — ABNORMAL LOW (ref 135–145)
Total Bilirubin: 0.9 mg/dL (ref 0.3–1.2)
Total Protein: 6.9 g/dL (ref 6.5–8.1)

## 2020-09-24 LAB — URINALYSIS, ROUTINE W REFLEX MICROSCOPIC
Bilirubin Urine: NEGATIVE
Glucose, UA: 150 mg/dL — AB
Ketones, ur: NEGATIVE mg/dL
Leukocytes,Ua: NEGATIVE
Nitrite: NEGATIVE
Protein, ur: 100 mg/dL — AB
Specific Gravity, Urine: 1.011 (ref 1.005–1.030)
pH: 5 (ref 5.0–8.0)

## 2020-09-24 LAB — CBC WITH DIFFERENTIAL/PLATELET
Abs Immature Granulocytes: 0.04 10*3/uL (ref 0.00–0.07)
Basophils Absolute: 0 10*3/uL (ref 0.0–0.1)
Basophils Relative: 0 %
Eosinophils Absolute: 0 10*3/uL (ref 0.0–0.5)
Eosinophils Relative: 0 %
HCT: 38.6 % — ABNORMAL LOW (ref 39.0–52.0)
Hemoglobin: 12.5 g/dL — ABNORMAL LOW (ref 13.0–17.0)
Immature Granulocytes: 0 %
Lymphocytes Relative: 22 %
Lymphs Abs: 2.1 10*3/uL (ref 0.7–4.0)
MCH: 28.7 pg (ref 26.0–34.0)
MCHC: 32.4 g/dL (ref 30.0–36.0)
MCV: 88.5 fL (ref 80.0–100.0)
Monocytes Absolute: 0.9 10*3/uL (ref 0.1–1.0)
Monocytes Relative: 10 %
Neutro Abs: 6.2 10*3/uL (ref 1.7–7.7)
Neutrophils Relative %: 68 %
Platelets: 193 10*3/uL (ref 150–400)
RBC: 4.36 MIL/uL (ref 4.22–5.81)
RDW: 14.3 % (ref 11.5–15.5)
WBC: 9.3 10*3/uL (ref 4.0–10.5)
nRBC: 0 % (ref 0.0–0.2)

## 2020-09-24 MED ORDER — POTASSIUM CHLORIDE 10 MEQ/100ML IV SOLN
10.0000 meq | INTRAVENOUS | Status: AC
  Administered 2020-09-25 (×2): 10 meq via INTRAVENOUS
  Filled 2020-09-24: qty 100

## 2020-09-24 MED ORDER — LACTATED RINGERS IV BOLUS: 1000.0000 mL | Freq: Once | INTRAVENOUS | Status: DC

## 2020-09-24 MED ORDER — LACTATED RINGERS IV SOLN: INTRAVENOUS | Status: DC

## 2020-09-24 NOTE — ED Triage Notes (Signed)
Pt reports he had labs done at renal doctor, states that he was told his kidney function had worsened and was sent for further evaluation.

## 2020-09-24 NOTE — ED Provider Notes (Signed)
[facility] EMERGENCY DEPARTMENT Provider Note   CSN: [id number] Arrival date & time: 09/24/20  1401     History Chief Complaint  Patient presents with  . Abnormal Lab    Joshua [name] is a 64 y.o. male with history of CAD A/P PCI/DES, chronic combined CHF with EF 55 to 60%, hypertension, hyperlipidemia, diabetes, CKD stage IV, OSA, obesity presents to the ED for evaluation of abnormal labs.  Patient went to his appointment with nephrologist [name] who obtained blood work.  He was called this afternoon and told that his kidney function and potassium were abnormal.  He was told to come to the ED to get IV fluids.  Patient is taking 160 mg of Lasix 3 times a week and 80 mg of Lasix 4 times a week.  No recent dose changes in diuretics.  Admits that he does not drink enough water because otherwise he would be constantly urinating.  Reports for the last couple weeks has had decreased energy, tiredness, decreased appetite and decreased oral intake.  States he just does not want to eat.  He will take 2-3 bites of food at a time.  Has lost about 40 pounds in the last 4 to 5 months.  Denies recent illnesses to cause him to be dehydrated.  No recent vomiting or diarrhea.  Denies cough, chest pain, shortness of breath or leg swelling.  No interventions.  No other associate symptoms.  HPI     Past Medical History:  Diagnosis Date  . Anemia   . Chronic combined systolic and diastolic CHF (congestive heart failure) ([place])    a. 07/2015 Echo: EF 45-50%, mod LVH, mid apical/antsept AK, Gr 1 DD.  [name] CKD (chronic kidney disease), stage III ([place])   . Coronary artery disease    a. 2012 s/p MI/PCI: s/p DES to mid/dist RCA and overlapping DES to LAD;  b. 07/2015 Cath: LAD 10% ISR, D1 40(jailed), LCX 80m OM2 30, OM3 30, RCA 581mSR, 10d ISR; c. 07/2016 NSTEMI/PCI: LAD 85ost/p/m (3.5x20 Synergy DES overlapping prior stent), D1 40, LCX 7532mM2 95 (2.75x16 Synergy DES), OM3 30, RCA 29m38md.    . Depression   . Diabetic gastroparesis (HCC)[place]. DKA (diabetic ketoacidoses) 03/14/2018  . GERD (gastroesophageal reflux disease)   . Gout   . Heart murmur   . Hyperlipemia   . Hypertension   . Hypertensive heart disease   . Ischemic cardiomyopathy    a. 07/2015 Echo: EF 45-50%, mod LVH, mid-apicalanteroseptal AK, Gr1 DD.  . Morbid obesity (HCC)[place]. OSA on CPAP   . Osteomyelitis (HCC)[place] a. 07/2016 MTP joint of R great toe s/p transmetatarsal amputation.  . Polyneuropathy in diabetes(357.2)   . Pulmonary sarcoidosis (HCC)[place] "problems w/it years ago; no problems anymore" (07/03/2014)  . Rectal bleeding   . Sleep apnea   . [facility]  Patient Active Problem List   Diagnosis Date Noted  . Type 2 diabetes mellitus with stage 3 chronic kidney disease (HCC)[place]/13/2021  . Unstable angina (HCC)[place]/03/2019  . Chest pain 08/16/2019  . NSTEMI (non-ST elevated myocardial infarction) (HCC)[place]/24/2020  . ACS (acute coronary syndrome) (HCC)[place]/24/2020  . Sepsis (HCC)[place]/02/2018  . DKA (diabetic ketoacidoses) 03/15/2018  . Special screening for malignant neoplasms, colon 08/22/2017  . Acute renal failure superimposed on stage 3 chronic kidney disease (HCC)[place]/06/2017  . Essential hypertension   .  Status post transmetatarsal amputation of foot, right ([place]) 11/08/2016  . Diabetic polyneuropathy associated with type 2 diabetes mellitus ([place]) 11/08/2016  . Pure hypercholesterolemia   . AKI (acute kidney injury) ([place])   . Acute blood loss anemia   . DM type 2 with diabetic peripheral neuropathy ([place])   . Physical debility 08/03/2016  . Chronic osteomyelitis of right foot ([place])   . Cellulitis and abscess of leg, except foot   . Penetrating wound of right foot   . CKD (chronic kidney disease) stage 3, GFR 30-59 ml/min (HCC)   . Unstable angina pectoris ([place]) 07/18/2016  . Chronic combined systolic and diastolic congestive heart failure ([place])   . Chest pain with  moderate risk for cardiac etiology 06/24/2014  . CAD S/P multiple PCIs 06/24/2014  . Sleep apnea- on C-pap 06/24/2014  . Morbid obesity due to excess calories ([place]) 11/10/2013  . Hyperglycemia 10/29/2013  . Hyperlipidemia 01/12/2011  . Benign essential HTN 01/12/2011  . GERD 01/12/2011    Past Surgical History:  Procedure Laterality Date  . AMPUTATION Right 07/24/2016   Procedure: TRANSMETATARSAL FOOT AMPUTATION;  Surgeon: [name], MD;  Location: [facility];  Service: Orthopedics;  Laterality: Right;  . BALLOON DILATION N/A 03/21/2018   Procedure: BALLOON DILATION;  Surgeon: [name], MD;  Location: [facility] ENDOSCOPY;  Service: Endoscopy;  Laterality: N/A;  . BREAST LUMPECTOMY Right ~ 2000   "benign"  . CARDIAC CATHETERIZATION N/A 07/30/2015   Procedure: Left Heart Cath and Coronary Angiography;  Surgeon: Burnell Blanks, MD;  Location: [facility] CV LAB;  Service: Cardiovascular;  Laterality: N/A;  . CARDIAC CATHETERIZATION N/A 07/19/2016   Procedure: Left Heart Cath and Coronary Angiography;  Surgeon: Belva Crome, MD;  Location: [place] CV LAB;  Service: Cardiovascular;  Laterality: N/A;  . CARDIAC CATHETERIZATION N/A 07/29/2016   Procedure: Coronary Stent Intervention;  Surgeon: Belva Crome, MD;  Location: [place] CV LAB;  Service: Cardiovascular;  Laterality: N/A;  . CARDIAC CATHETERIZATION N/A 07/29/2016   Procedure: Intravascular Ultrasound/IVUS;  Surgeon: Belva Crome, MD;  Location: [facility] CV LAB;  Service: Cardiovascular;  Laterality: N/A;  . CATARACT EXTRACTION W/ INTRAOCULAR LENS  IMPLANT, BILATERAL Bilateral 2014-2015  . COLONOSCOPY WITH PROPOFOL N/A 08/22/2017   Procedure: COLONOSCOPY WITH PROPOFOL;  Surgeon: [name], MD;  Location: [place];  Service: Endoscopy;  Laterality: N/A;  . CORONARY ANGIOPLASTY WITH STENT PLACEMENT  10/31/2011   30 % MID ATRIOVENTRICULAR GROOVE CX STENOSIS. 90 % MID RCA.PTA TO LAD/STENTING OF TANDEM 60-70%. LAD  STENOSIS 99% REDUCED TO 0%.3.0 X 32MM PROMUS DES POSTDILATED TO 3.25MM.  [name] CORONARY ANGIOPLASTY WITH STENT PLACEMENT  07/03/2014   "2"  . CORONARY STENT INTERVENTION N/A 04/09/2019   Procedure: CORONARY STENT INTERVENTION;  Surgeon: Jettie Booze, MD;  Location: [place] CV LAB;  Service: Cardiovascular;  Laterality: N/A;  . CORONARY STENT INTERVENTION N/A 08/22/2019   Procedure: CORONARY STENT INTERVENTION;  Surgeon: Martinique, Peter M, MD;  Location: [facility] CV LAB;  Service: Cardiovascular;  Laterality: N/A;  . ESOPHAGOGASTRODUODENOSCOPY (EGD) WITH PROPOFOL N/A 03/21/2018   Procedure: ESOPHAGOGASTRODUODENOSCOPY (EGD) WITH PROPOFOL;  Surgeon: [name], MD;  Location: [facility];  Service: Endoscopy;  Laterality: N/A;  . I & D EXTREMITY Right 10/30/2013   Procedure: IRRIGATION AND DEBRIDEMENT RIGHT SMALL FINGER POSSIBLE SECONDARY WOUND CLOSURE;  Surgeon: [name], MD;  Location: [facility];  Service: Orthopedics;  Laterality: Right;  [name] is available after 4pm  . I &  D EXTREMITY Right 11/01/2013   Procedure:  IRRIGATION AND DEBRIDEMENT RIGHT  PROXIMAL PHALANGEAL LEVEL AMPUTATION;  Surgeon: [name], MD;  Location: [facility];  Service: Orthopedics;  Laterality: Right;  . INCISION AND DRAINAGE Right 10/28/2013   Procedure: INCISION AND DRAINAGE Right small finger;  Surgeon: [name], MD;  Location: [facility];  Service: Orthopedics;  Laterality: Right;  . LEFT HEART CATH Left 11/03/2011   Procedure: LEFT HEART CATH;  Surgeon: [name], MD;  Location: [facility] CATH LAB;  Service: Cardiovascular;  Laterality: Left;  . LEFT HEART CATH AND CORONARY ANGIOGRAPHY N/A 04/09/2019   Procedure: LEFT HEART CATH AND CORONARY ANGIOGRAPHY;  Surgeon: Jettie Booze, MD;  Location: [facility] CV LAB;  Service: Cardiovascular;  Laterality: N/A;  . LEFT HEART CATHETERIZATION WITH CORONARY ANGIOGRAM N/A 07/03/2014   Procedure: LEFT HEART CATHETERIZATION WITH CORONARY  ANGIOGRAM;  Surgeon: [name], MD;  Location: [facility] C[facility] LAB;  Service: Cardiovascular;  Laterality: N/A;  . PERCUTANEOUS CORONARY STENT INTERVENTION (PCI-S) Left 11/03/2011   Procedure: PERCUTANEOUS CORONARY STENT INTERVENTION (PCI-S);  Surgeon: [name], MD;  Location: [facility] CATH LAB;  Service: Cardiovascular;  Laterality: Left;  . RIGHT/LEFT HEART CATH AND CORONARY ANGIOGRAPHY N/A 08/22/2019   Procedure: RIGHT/LEFT HEART CATH AND CORONARY ANGIOGRAPHY;  Surgeon: Martinique, Peter M, MD;  Location: [place] CV LAB;  Service: Cardiovascular;  Laterality: N/A;  . TOE AMPUTATION Right ~ 2010   "2nd toe"  . TOE AMPUTATION Right 2012   "3rd toe"       Family History  Problem Relation Age of Onset  . Heart attack Maternal Aunt   . Diabetes Maternal Grandmother   . Hypertension Maternal Grandmother     Social History   Tobacco Use  . Smoking status: Never Smoker  . Smokeless tobacco: Never Used  Vaping Use  . Vaping Use: Never used  Substance Use Topics  . Alcohol use: Yes    Comment: social   . Drug use: No    Home Medications Prior to Admission medications   Medication Sig Start Date End Date Taking? Authorizing Provider  Accu-Chek FastClix Lancets MISC USE UP TO FOUR TIMES DAILY AS DIRECTED 04/10/19   [provider]  ACCU-CHEK GUIDE test strip USE UP TO FOUR TIMES DAILY AS DIRECTED 05/15/20   Philemon Kingdom, MD  acetaminophen (TYLENOL) 650 MG CR tablet Take 1,300 mg by mouth daily as needed for pain.    [provider]  allopurinol (ZYLOPRIM) 300 MG tablet Take 300 mg by mouth daily.     [provider]  amLODipine (NORVASC) 10 MG tablet Take 1 tablet (10 mg total) by mouth daily. 04/23/20   [name], MD  aspirin EC 81 MG tablet Take 81 mg by mouth daily.     [provider]  blood glucose meter kit and supplies Dispense based on patient and insurance preference. Use up to four times daily as directed. (FOR ICD-10 E10.9, E11.9).  04/09/19   [name], PA  ezetimibe (ZETIA) 10 MG tablet Take 1 tablet (10 mg total) by mouth daily. 11/21/19 11/15/20  [name], MD  furosemide (LASIX) 80 MG tablet Take 80 mg by mouth. Takes 160 mg 3 days per week and 80 mg 4 days per week    [provider]  hydrALAZINE (APRESOLINE) 100 MG tablet Take 100 mg by mouth 3 (three) times daily.    [provider]  insulin aspart (NOVOLOG FLEXPEN) 100 UNIT/ML  FlexPen Inject 10-15 Units into the skin 3 (three) times daily with meals. 06/06/20   Philemon Kingdom, MD  insulin glargine (LANTUS) 100 UNIT/ML injection Inject 0.3 mLs (30 Units total) into the skin 2 (two) times daily. 04/09/20   Philemon Kingdom, MD  isosorbide mononitrate (IMDUR) 120 MG 24 hr tablet Take 1 tablet (120 mg total) by mouth daily. 08/26/20   Kroeger, Lorelee Cover., PA-C  metoprolol succinate (TOPROL-XL) 50 MG 24 hr tablet Take 3 tablets (150 mg total) by mouth daily. Take with or immediately following a meal. 08/24/19 08/26/20  Darliss Cheney, MD  nitroGLYCERIN (NITROSTAT) 0.4 MG SL tablet Place 1 tablet (0.4 mg total) under the tongue every 5 (five) minutes as needed for chest pain. 01/12/19   Almyra Deforest, PA  pantoprazole (PROTONIX) 40 MG tablet TAKE 1 TABLET BY MOUTH TWICE DAILY BEFORE MEAL(S) 09/19/19   [name], MD  potassium chloride (KLOR-CON) 20 MEQ packet Take by mouth 2 (two) times daily. Unknown amount (2 pills daily)    [provider]  rosuvastatin (CRESTOR) 40 MG tablet Take 1 tablet by mouth once daily 10/17/19   Almyra Deforest, PA  ticagrelor (BRILINTA) 90 MG TABS tablet Take 1 tablet (90 mg total) by mouth 2 (two) times daily. 05/28/20   Almyra Deforest, PA  VICTOZA 18 MG/3ML SOPN INJECT 0.3 MLS (1.8MG TOTAL) INTO THE SKIN DAILY 08/25/20   Philemon Kingdom, MD    Allergies    Chlorthalidone  Review of Systems   Review of Systems  Constitutional: Positive for appetite change.  All other systems reviewed and are negative.   Physical  Exam Updated Vital Signs BP 123/70 (BP Location: Left Arm)   Pulse 81   Temp 98.1 F (36.7 C)   Resp 18   SpO2 100%   Physical Exam Vitals and nursing note reviewed.  Constitutional:      Appearance: He is well-developed.     Comments: Non toxic.  HENT:     Head: Normocephalic and atraumatic.     Nose: Nose normal.     Mouth/Throat:     Comments: Dry lips but moist mucous membranes Eyes:     Conjunctiva/sclera: Conjunctivae normal.  Cardiovascular:     Rate and Rhythm: Normal rate and regular rhythm.     Heart sounds: Normal heart sounds.     Comments: No lower extremity edema. Pulmonary:     Effort: Pulmonary effort is normal.     Breath sounds: Normal breath sounds.  Abdominal:     General: Bowel sounds are normal.     Palpations: Abdomen is soft.     Tenderness: There is no abdominal tenderness.     Comments: Obese, nontender abdomen.  No epigastric/RUQ abdominal tenderness.  Negative Murphy's.  Musculoskeletal:        General: Normal range of motion.     Cervical back: Normal range of motion.  Skin:    General: Skin is warm and dry.     Capillary Refill: Capillary refill takes less than 2 seconds.  Neurological:     Mental Status: He is alert.  Psychiatric:        Behavior: Behavior normal.     ED Results / Procedures / Treatments   Labs (all labs ordered are listed, but only abnormal results are displayed) Labs Reviewed  COMPREHENSIVE METABOLIC PANEL - Abnormal; Notable for the following components:      Result Value   Sodium 133 (*)    Potassium 2.9 (*)  Chloride 94 (*)    CO2 21 (*)    Glucose, Bld 334 (*)    BUN 71 (*)    Creatinine, Ser 5.55 (*)    Calcium 8.7 (*)    Albumin 3.3 (*)    AST 202 (*)    ALT 227 (*)    Alkaline Phosphatase 172 (*)    GFR, Estimated 10 (*)    Anion gap 18 (*)    All other components within normal limits  CBC WITH DIFFERENTIAL/PLATELET - Abnormal; Notable for the following components:   Hemoglobin 12.5 (*)     HCT 38.6 (*)    All other components within normal limits  URINALYSIS, ROUTINE W REFLEX MICROSCOPIC - Abnormal; Notable for the following components:   APPearance CLOUDY (*)    Glucose, UA 150 (*)    Hgb urine dipstick LARGE (*)    Protein, ur 100 (*)    Bacteria, UA RARE (*)    All other components within normal limits  MAGNESIUM  LIPASE, BLOOD    EKG None  Radiology US Abdomen Complete  Result Date: 09/24/2020 CLINICAL DATA:  Acute renal injury and elevated LFTs EXAM: ABDOMEN ULTRASOUND COMPLETE COMPARISON:  None. FINDINGS: Gallbladder: No gallstones or wall thickening visualized. No sonographic Murphy sign noted by sonographer. Common bile duct: Diameter: 5 mm. Liver: No focal lesion identified. Within normal limits in parenchymal echogenicity. Portal vein is patent on color Doppler imaging with normal direction of blood flow towards the liver. IVC: No abnormality visualized. Pancreas: Visualized portion unremarkable. Spleen: Size and appearance within normal limits. Right Kidney: Length: 9.9 cm. Echogenicity within normal limits. No mass or hydronephrosis visualized. Left Kidney: Length: 9.0 cm. Echogenicity within normal limits. No mass or hydronephrosis visualized. Abdominal aorta: No aneurysm visualized. Other findings: None. IMPRESSION: No acute abnormality noted. Electronically Signed   By: Inez Catalina M.D.   On: 09/24/2020 22:28    Procedures .Critical Care Performed by: Kinnie Feil, PA-C Authorized by: Kinnie Feil, PA-C   Critical care provider statement:    Critical care time (minutes):  45   Critical care was necessary to treat or prevent imminent or life-threatening deterioration of the following conditions:  Renal failure   Critical care was time spent personally by me on the following activities:  Discussions with consultants, evaluation of patient's response to treatment, examination of patient, ordering and performing treatments and interventions,  ordering and review of laboratory studies, ordering and review of radiographic studies, pulse oximetry, re-evaluation of patient's condition, obtaining history from patient or surrogate, review of old charts and development of treatment plan with patient or surrogate   I assumed direction of critical care for this patient from another provider in my specialty: no     (including critical care time)  Medications Ordered in ED Medications  potassium chloride 10 mEq in 100 mL IVPB (has no administration in time range)  lactated ringers infusion (has no administration in time range)    ED Course  I have reviewed the triage vital signs and the nursing notes.  Pertinent labs & imaging results that were available during my care of the patient were reviewed by me and considered in my medical decision making (see chart for details).  Clinical Course as of Sep 24 2357  Wed Sep 24, 2020  2046 Hemoglobin(!): 12.5 [CG]  2046 Potassium(!): 2.9 [CG]  2047 Glucose(!): 334 [CG]  2047 Anion gap(!): 18 [CG]  2047 Creatinine(!): 5.55 [CG]  2047 BUN(!): 71 [CG]  2047  Albumin(!): 3.3 [CG]  2047 AST(!): 202 [CG]  2047 ALT(!): 227 [CG]  2047 Alkaline Phosphatase(!): 172 [CG]  2047 Bilirubin Urine: NEGATIVE [CG]  2047 Protein(!): 100 [CG]  2047 Bacteria, UA(!): RARE [CG]  2047 WBC, UA: 6-10 [CG]  2047 Appearance(!): CLOUDY [CG]    Clinical Course User Index [CG] Arlean Hopping   MDM Rules/Calculators/A&P                         64 year old male with history of CKD stage IV, CHF EF 45%, diabetes presents to the ED for evaluation of abnormal lab work.  Per patient report his kidney function was elevated and [name] recommended ED evaluation for IV fluids.  I spoke to Dr. Posey Pronto with nephrology who reviewed patient's chart and recommended LR IV fluids for hydration, potassium and renal ultrasound to evaluate for postrenal obstruction.  ER work-up initiated in triage including CBC, CMP,  urinalysis.  After my evaluation and chart review I added magnesium, lipase, complete abdominal ultrasound.  ER work-up personally visualized and interpreted by me.  CBC is unremarkable.  CMP reveals acute on chronic CKD with creatinine 5.5, patient's baseline is around 2-3.  Hypokalemia 2.9.  Anion gap 18 with BUN 71.  Patient's LFTs are elevated.  Abd US unremarkable without post renal obstruction, normal gallbladder, CBD.  I have ordered LR infusion at 100 mL/h, IV K.  I have ordered continuous cardiac and pulse oximeter monitoring.  DDx includes overdiuresis, dehydration leading to acute on chronic CKD, hypokalemia.  Patient will need gentle hydration, K replacement given history of CHF EF 45%.  Hospitalist to admit patient for further treatment.  Discussed with EDP.    Final Clinical Impression(s) / ED Diagnoses Final diagnoses:  Acute renal failure superimposed on stage 4 chronic kidney disease, unspecified acute renal failure type ([place])  Elevated LFTs    Rx / DC Orders ED Discharge Orders    None       Kinnie Feil, PA-C 09/24/20 2358    Malvin Johns, MD 09/25/20 1017

## 2020-09-25 ENCOUNTER — Other Ambulatory Visit: Payer: Self-pay

## 2020-09-25 DIAGNOSIS — E1142 Type 2 diabetes mellitus with diabetic polyneuropathy: Secondary | ICD-10-CM

## 2020-09-25 DIAGNOSIS — I10 Essential (primary) hypertension: Secondary | ICD-10-CM

## 2020-09-25 DIAGNOSIS — E876 Hypokalemia: Secondary | ICD-10-CM | POA: Diagnosis not present

## 2020-09-25 DIAGNOSIS — N179 Acute kidney failure, unspecified: Secondary | ICD-10-CM | POA: Diagnosis not present

## 2020-09-25 LAB — CBC
HCT: 37.7 % — ABNORMAL LOW (ref 39.0–52.0)
Hemoglobin: 11.8 g/dL — ABNORMAL LOW (ref 13.0–17.0)
MCH: 27.7 pg (ref 26.0–34.0)
MCHC: 31.3 g/dL (ref 30.0–36.0)
MCV: 88.5 fL (ref 80.0–100.0)
Platelets: 167 10*3/uL (ref 150–400)
RBC: 4.26 MIL/uL (ref 4.22–5.81)
RDW: 14.3 % (ref 11.5–15.5)
WBC: 8.2 10*3/uL (ref 4.0–10.5)
nRBC: 0 % (ref 0.0–0.2)

## 2020-09-25 LAB — GLUCOSE, CAPILLARY
Glucose-Capillary: 399 mg/dL — ABNORMAL HIGH (ref 70–99)
Glucose-Capillary: 381 mg/dL — ABNORMAL HIGH (ref 70–99)
Glucose-Capillary: 369 mg/dL — ABNORMAL HIGH (ref 70–99)

## 2020-09-25 LAB — RESPIRATORY PANEL BY RT PCR (FLU A&B, COVID)
Influenza A by PCR: NEGATIVE
Influenza B by PCR: NEGATIVE
SARS Coronavirus 2 by RT PCR: NEGATIVE

## 2020-09-25 LAB — BASIC METABOLIC PANEL
Anion gap: 16 — ABNORMAL HIGH (ref 5–15)
BUN: 75 mg/dL — ABNORMAL HIGH (ref 8–23)
CO2: 22 mmol/L (ref 22–32)
Calcium: 8.5 mg/dL — ABNORMAL LOW (ref 8.9–10.3)
Chloride: 96 mmol/L — ABNORMAL LOW (ref 98–111)
Creatinine, Ser: 5.57 mg/dL — ABNORMAL HIGH (ref 0.61–1.24)
GFR, Estimated: 10 mL/min — ABNORMAL LOW (ref >60)
Glucose, Bld: 328 mg/dL — ABNORMAL HIGH (ref 70–99)
Potassium: 3 mmol/L — ABNORMAL LOW (ref 3.5–5.1)
Sodium: 134 mmol/L — ABNORMAL LOW (ref 135–145)

## 2020-09-25 LAB — MAGNESIUM: Magnesium: 2.4 mg/dL (ref 1.7–2.4)

## 2020-09-25 LAB — HIV ANTIBODY (ROUTINE TESTING W REFLEX): HIV Screen 4th Generation wRfx: NONREACTIVE

## 2020-09-25 LAB — CBG MONITORING, ED
Glucose-Capillary: 304 mg/dL — ABNORMAL HIGH (ref 70–99)
Glucose-Capillary: 364 mg/dL — ABNORMAL HIGH (ref 70–99)

## 2020-09-25 LAB — CREATININE, SERUM
Creatinine, Ser: 5.73 mg/dL — ABNORMAL HIGH (ref 0.61–1.24)
GFR, Estimated: 10 mL/min — ABNORMAL LOW (ref >60)

## 2020-09-25 LAB — LIPASE, BLOOD: Lipase: 61 U/L — ABNORMAL HIGH (ref 11–51)

## 2020-09-25 MED ORDER — SODIUM CHLORIDE 0.9 % IV SOLN: INTRAVENOUS | Status: DC

## 2020-09-25 MED ORDER — SENNOSIDES-DOCUSATE SODIUM 8.6-50 MG PO TABS: 1.0000 | ORAL_TABLET | Freq: Every evening | ORAL | Status: DC | PRN

## 2020-09-25 MED ORDER — LIRAGLUTIDE 18 MG/3ML ~~LOC~~ SOPN: 1.8000 mg | PEN_INJECTOR | Freq: Every day | SUBCUTANEOUS | Status: DC

## 2020-09-25 MED ORDER — LACTATED RINGERS IV SOLN: INTRAVENOUS | Status: DC

## 2020-09-25 MED ORDER — PROMETHAZINE HCL 25 MG/ML IJ SOLN: 12.5000 mg | Freq: Four times a day (QID) | INTRAMUSCULAR | Status: DC | PRN

## 2020-09-25 MED ORDER — ZOLPIDEM TARTRATE 5 MG PO TABS
5.0000 mg | ORAL_TABLET | Freq: Every evening | ORAL | Status: DC | PRN
  Administered 2020-09-25: 5 mg via ORAL
  Filled 2020-09-25: qty 1

## 2020-09-25 MED ORDER — AMLODIPINE BESYLATE 10 MG PO TABS
10.0000 mg | ORAL_TABLET | Freq: Every day | ORAL | Status: DC
  Administered 2020-09-25: 10 mg via ORAL
  Filled 2020-09-25: qty 2

## 2020-09-25 MED ORDER — POTASSIUM CHLORIDE 10 MEQ/100ML IV SOLN
10.0000 meq | INTRAVENOUS | Status: AC
  Administered 2020-09-25 (×4): 10 meq via INTRAVENOUS
  Filled 2020-09-25 (×4): qty 100

## 2020-09-25 MED ORDER — INSULIN GLARGINE 100 UNIT/ML ~~LOC~~ SOLN
30.0000 [IU] | Freq: Two times a day (BID) | SUBCUTANEOUS | Status: DC
  Administered 2020-09-25: 30 [IU] via SUBCUTANEOUS
  Filled 2020-09-25 (×3): qty 0.3

## 2020-09-25 MED ORDER — INSULIN ASPART 100 UNIT/ML ~~LOC~~ SOLN
0.0000 [IU] | Freq: Three times a day (TID) | SUBCUTANEOUS | Status: DC
  Administered 2020-09-25 (×3): 9 [IU] via SUBCUTANEOUS
  Administered 2020-09-26: 1 [IU] via SUBCUTANEOUS
  Administered 2020-09-26: 3 [IU] via SUBCUTANEOUS
  Administered 2020-09-26 – 2020-09-27 (×2): 2 [IU] via SUBCUTANEOUS
  Administered 2020-09-27: 9 [IU] via SUBCUTANEOUS
  Administered 2020-09-27: 2 [IU] via SUBCUTANEOUS
  Administered 2020-09-28 (×2): 3 [IU] via SUBCUTANEOUS

## 2020-09-25 MED ORDER — EZETIMIBE 10 MG PO TABS
10.0000 mg | ORAL_TABLET | Freq: Every day | ORAL | Status: DC
  Administered 2020-09-25 – 2020-09-28 (×4): 10 mg via ORAL
  Filled 2020-09-25 (×4): qty 1

## 2020-09-25 MED ORDER — HEPARIN SODIUM (PORCINE) 5000 UNIT/ML IJ SOLN
5000.0000 [IU] | Freq: Three times a day (TID) | INTRAMUSCULAR | Status: DC
  Administered 2020-09-25 – 2020-09-28 (×10): 5000 [IU] via SUBCUTANEOUS
  Filled 2020-09-25 (×10): qty 1

## 2020-09-25 MED ORDER — INSULIN ASPART 100 UNIT/ML ~~LOC~~ SOLN
0.0000 [IU] | Freq: Every day | SUBCUTANEOUS | Status: DC
  Administered 2020-09-25: 4 [IU] via SUBCUTANEOUS
  Administered 2020-09-25: 5 [IU] via SUBCUTANEOUS
  Administered 2020-09-26: 2 [IU] via SUBCUTANEOUS
  Administered 2020-09-27: 5 [IU] via SUBCUTANEOUS

## 2020-09-25 MED ORDER — HYDRALAZINE HCL 50 MG PO TABS
100.0000 mg | ORAL_TABLET | Freq: Three times a day (TID) | ORAL | Status: DC
  Administered 2020-09-25: 100 mg via ORAL
  Filled 2020-09-25: qty 2

## 2020-09-25 MED ORDER — TICAGRELOR 90 MG PO TABS
90.0000 mg | ORAL_TABLET | Freq: Two times a day (BID) | ORAL | Status: DC
  Administered 2020-09-25 – 2020-09-28 (×8): 90 mg via ORAL
  Filled 2020-09-25 (×8): qty 1

## 2020-09-25 MED ORDER — ISOSORBIDE MONONITRATE ER 60 MG PO TB24
120.0000 mg | ORAL_TABLET | Freq: Every day | ORAL | Status: DC
  Administered 2020-09-25: 120 mg via ORAL
  Filled 2020-09-25: qty 4

## 2020-09-25 MED ORDER — INSULIN GLARGINE 100 UNIT/ML ~~LOC~~ SOLN
15.0000 [IU] | Freq: Two times a day (BID) | SUBCUTANEOUS | Status: DC
  Administered 2020-09-25 – 2020-09-28 (×6): 15 [IU] via SUBCUTANEOUS
  Filled 2020-09-25 (×7): qty 0.15

## 2020-09-25 MED ORDER — ASPIRIN EC 81 MG PO TBEC
81.0000 mg | DELAYED_RELEASE_TABLET | Freq: Every day | ORAL | Status: DC
  Administered 2020-09-25 – 2020-09-28 (×4): 81 mg via ORAL
  Filled 2020-09-25 (×4): qty 1

## 2020-09-25 MED ORDER — ACETAMINOPHEN 325 MG PO TABS: 650.0000 mg | ORAL_TABLET | Freq: Four times a day (QID) | ORAL | Status: DC | PRN

## 2020-09-25 MED ORDER — ACETAMINOPHEN 650 MG RE SUPP: 650.0000 mg | Freq: Four times a day (QID) | RECTAL | Status: DC | PRN

## 2020-09-25 NOTE — Progress Notes (Signed)
NEW ADMISSION NOTE New Admission Note:   Arrival Method:  Wheelchair Mental Orientation: AAOx4 Telemetry: n/a Assessment: Completed Skin: Intact IV: 20g LFA Pain: 0/10 Tubes: n/a Safety Measures: Safety Fall Prevention Plan has been given, discussed and signed Admission: Completed [facility] Orientation: Patient has been orientated to the room, unit and staff.  Family: n/a  Orders have been reviewed and implemented. Will continue to monitor the patient. Call light has been placed within reach and bed alarm has been activated.   Vira Agar, RN

## 2020-09-25 NOTE — Plan of Care (Signed)
  Problem: Education: Goal: Knowledge of General Education information will improve Description Including pain rating scale, medication(s)/side effects and non-pharmacologic comfort measures Outcome: Progressing   

## 2020-09-25 NOTE — Progress Notes (Signed)
PROGRESS NOTE    MAHAD NEWSTROM  BVQ:[id number] DOB: 02/02/1956 DOA: 09/24/2020 PCP: [name], MD    Brief Narrative:  [name] was admitted with a working diagnosis of acute kidney injury on chronic kidney disease stage III to IV.  64 year old male with past medical history for coronary artery disease status post angioplasty, chronic diastolic heart failure, hypertension, dyslipidemia, type 2 diabetes mellitus, chronic kidney disease stage III to IV, obstructive sleep apnea and obesity class III.  Patient reported decreased energy, easy fatigability, poor appetite and low oral intake for about 2 weeks.  Patient takes high doses of furosemide at home.  He was seen at his nephrologist office [name] and blood work was performed.  He was calling to come to the hospital due to worsening kidney function and hypokalemia.  On his initial physical examination blood pressure 95/54, 123/70, heart rate 81, respiratory rate 18, oxygen saturation 96%.  His lungs were clear to auscultation bilaterally, heart S1-S2, present with me, abdomen protuberant, nontender, no lower extremity edema. Sodium 133, potassium 2.9, chloride 94, bicarb 21, glucose 334, BUN 71, creatinine 5.5, anion gap 18, AST 202, ALT 227, white count 9.3, hemoglobin 12.5, hematocrit 38.6, platelets 193.  SARS COVID-19 negative.  Urinalysis specific gravity 1.011, 100 protein, 0-5 red cells, 6-10 white cells.  EKG 74 bpm, normal axis, first-degree AV block, sinus rhythm with poor R wave progression, no significant ST segment or T wave changes.  Assessment & Plan:   Principal Problem:   Acute renal failure superimposed on stage 3 chronic kidney disease (HCC) Active Problems:   Morbid obesity due to excess calories (HCC)   Diabetic polyneuropathy associated with type 2 diabetes mellitus ([place])   Essential hypertension   Type 2 diabetes mellitus with stage 3 chronic kidney disease (HCC)   Hypokalemia   1. Aki on CKD stage 3b to  4/ hypokalemia. Serum cr today at 5.57, with K at 3,0 and bicarbonate at 22, Cl 96.  His cr at baseline is 2 to 3. US of the abdomen with no hydronephrosis, today on physical examination with no signs of volume overload.    Continue hydration with isotonic saline at 50 ml per H and follow up on renal function and electrolytes in am, strict in and out, avoid hypotension and nephrotoxic medications.  This am had 40 meq Kcl.  Out of bed to chair tid with meals, PT and OT evaluation.   2. T2DM uncontrolled/ dyslipidemia. Fasting glucose is 328, will continue insulin sliding scale for glucose cover and monitoring. Considering reduction in GFR, will decrease basal insulin in 50% to prevent hypoglycemia  Continue with ezetimibe.,   3. HTN. Continue blood pressure monitoring, continue to hold on diuretics, hydralazine, isosorbide and amlodipine.    4. Obesity class 3. Will need outpatient follow up.   5. Elevated liver enzymes. No abdominal pain, no clinical signs of liver failure, possible hypoperfusion.  Follow on liver enzymes in am.   6. CAD. Continue with ticagrelor and aspirin, no active chest pain.   Patient continue to be at high risk for worsening renal function and electrolytes abnormalities.   Status is: Inpatient  Remains inpatient appropriate because:IV treatments appropriate due to intensity of illness or inability to take PO   Dispo: The patient is from: Home              Anticipated d/c is to: Home              Anticipated d/c date  is: 3 days              Patient currently is not medically stable to d/c.   DVT prophylaxis: Sq heparin    Code Status:    full Family Communication:  No family at the bedside      Subjective: Patient is feeling better this am, improved energy and has been making urine, no nausea or vomiting, no dyspnea, chest pain or edema.   Objective: Vitals:   09/25/20 0621 09/25/20 0700 09/25/20 1129 09/25/20 1202  BP: 130/72 118/84 134/77 118/84    Pulse: 72 73 71 76  Resp: $Remo'15 16 17 19  'cBDcE$ Temp:   98.2 F (36.8 C) 98.1 F (36.7 C)  TempSrc:   Oral Oral  SpO2: 99% 98% 97% 97%  Weight:    125.3 kg  Height:    '5\' 9"'$  (1.753 m)    Intake/Output Summary (Last 24 hours) at 09/25/2020 1428 Last data filed at 09/25/2020 0652 Gross per 24 hour  Intake 1400 ml  Output --  Net 1400 ml   Filed Weights   09/25/20 1202  Weight: 125.3 kg    Examination:   General: Not in pain or dyspnea.  Neurology: Awake and alert, non focal  E ENT: no pallor, no icterus, oral mucosa moist Cardiovascular: No JVD. S1-S2 present, rhythmic, no gallops, rubs, or murmurs. Trace lower extremity edema. Pulmonary: positive breath sounds bilaterally, adequate air movement, no wheezing, rhonchi or rales. Gastrointestinal. Abdomen protuberant  Skin. No rashes Musculoskeletal: no joint deformities     Data Reviewed: I have personally reviewed following labs and imaging studies  CBC: Recent Labs  Lab 09/24/20 1423 09/25/20 0157  WBC 9.3 8.2  NEUTROABS 6.2  --   HGB 12.5* 11.8*  HCT 38.6* 37.7*  MCV 88.5 88.5  PLT 193 938   Basic Metabolic Panel: Recent Labs  Lab 09/24/20 1423 09/24/20 2305 09/25/20 0157  NA 133*  --  134*  K 2.9*  --  3.0*  CL 94*  --  96*  CO2 21*  --  22  GLUCOSE 334*  --  328*  BUN 71*  --  75*  CREATININE 5.55* 5.73* 5.57*  CALCIUM 8.7*  --  8.5*  MG  --  2.4  --    GFR: Estimated Creatinine Clearance: 18 mL/min (A) (by C-G formula based on SCr of 5.57 mg/dL (H)). Liver Function Tests: Recent Labs  Lab 09/24/20 1423  AST 202*  ALT 227*  ALKPHOS 172*  BILITOT 0.9  PROT 6.9  ALBUMIN 3.3*   Recent Labs  Lab 09/24/20 2305  LIPASE 61*   No results for input(s): AMMONIA in the last 168 hours. Coagulation Profile: No results for input(s): INR, PROTIME in the last 168 hours. Cardiac Enzymes: No results for input(s): CKTOTAL, CKMB, CKMBINDEX, TROPONINI in the last 168 hours. BNP (last 3 results) No  results for input(s): PROBNP in the last 8760 hours. HbA1C: No results for input(s): HGBA1C in the last 72 hours. CBG: Recent Labs  Lab 09/25/20 0132 09/25/20 0737 09/25/20 1233  GLUCAP 304* 364* 399*   Lipid Profile: No results for input(s): CHOL, HDL, LDLCALC, TRIG, CHOLHDL, LDLDIRECT in the last 72 hours. Thyroid Function Tests: No results for input(s): TSH, T4TOTAL, FREET4, T3FREE, THYROIDAB in the last 72 hours. Anemia Panel: No results for input(s): VITAMINB12, FOLATE, FERRITIN, TIBC, IRON, RETICCTPCT in the last 72 hours.    Radiology Studies: I have reviewed all of the imaging during this hospital visit personally  Scheduled Meds: . amLODipine  10 mg Oral Daily  . aspirin EC  81 mg Oral Daily  . ezetimibe  10 mg Oral Daily  . heparin  5,000 Units Subcutaneous Q8H  . hydrALAZINE  100 mg Oral TID  . insulin aspart  0-5 Units Subcutaneous QHS  . insulin aspart  0-9 Units Subcutaneous TID WC  . insulin glargine  30 Units Subcutaneous BID  . isosorbide mononitrate  120 mg Oral Daily  . liraglutide  1.8 mg Subcutaneous Daily  . ticagrelor  90 mg Oral BID   Continuous Infusions: . lactated ringers Stopped (09/25/20 0209)  . lactated ringers 75 mL/hr at 09/25/20 0741     LOS: 1 day        Dally Oshel Gerome Apley, MD

## 2020-09-25 NOTE — ED Notes (Signed)
Pt transferred to hospital bed for comfort.

## 2020-09-25 NOTE — H&P (Signed)
History and Physical    [name] WGN:[id number] DOB: 05/01/1956 DOA: 09/24/2020  PCP: [name], MD   Patient coming from: Home  Chief Complaint:   Generalized weakness and abnormal labs  HPI: Joshua [name] is a 64 y.o. male with medical history significant for CAD A/P PCI/DES, chronic combined CHF with EF 55 to 60%, hypertension, hyperlipidemia, diabetes, CKD stage IV, OSA, obesity presents to the ED for evaluation of abnormal labs that were found on recent blood work.  Patient went to his appointment with nephrologist [name] who obtained blood work.  He was called this afternoon and told that his kidney function and potassium were abnormal. Reports that he was told his renal function has deteriorated since his blood work from his physical with his PCP a month ago. He was told to come to the ED to get IV fluids.  Patient is taking 160 mg of Lasix 3 times a week and 80 mg of Lasix 4 times a week.  No recent dose changes in diuretics.  Admits that he does not drink enough water because otherwise he would be constantly urinating.  Reports for the last couple weeks has had decreased energy, tiredness, decreased appetite and decreased oral intake.  States he just does not want to eat.  He will take 2-3 bites of food at a time.  Has lost about 40 pounds in the last 4 to 5 months. Appetite changed after he was started on Victoza for his DMT2 and this medication does cause weight loss and decreased appetite.  Denies recent illnesses to cause him to be dehydrated.  No recent vomiting or diarrhea.  Denies cough, chest pain, shortness of breath or leg swelling.  ED Course: [name] is hemodynamically stable in the emergency room.  He is found of a potassium of 2.9.  Magnesium level has been drawn and is pending.  He was given a little potassium IV supplementation in the emergency room.  ER provider discussed with on-call nephrology recommended getting a renal ultrasound to make sure there is  no obstruction of his kidneys to admit overnight for IV fluid hydration and recheck of labs.  Renal ultrasound is negative for obstruction.  Review of Systems:  General: Reports generalized weakness. Denies fever, chills, weight loss, night sweats.  Denies dizziness.  Denies change in appetite HENT: Denies head trauma, headache, denies change in hearing, tinnitus.  Denies nasal congestion or bleeding.  Denies sore throat, sores in mouth.  Denies difficulty swallowing Eyes: Denies blurry vision, pain in eye, drainage.  Denies discoloration of eyes. Neck: Denies pain.  Denies swelling.  Denies pain with movement. Cardiovascular: Denies chest pain, palpitations.  Denies edema.  Denies orthopnea Respiratory: Denies shortness of breath, cough.  Denies wheezing.  Denies sputum production Gastrointestinal: Denies abdominal pain, swelling.  Reports nausea but no vomiting, diarrhea.  Denies melena.  Denies hematemesis. Musculoskeletal: Denies limitation of movement.  Denies deformity or swelling.  Denies pain.  Denies arthralgias or myalgias. Genitourinary: Denies pelvic pain.  Denies urinary frequency or hesitancy.  Denies dysuria.  Skin: Denies rash.  Denies petechiae, purpura, ecchymosis. Neurological: Denies headache.  Denies syncope.  Denies seizure activity.  Denies weakness or paresthesia.  Denies slurred speech, drooping face.  Denies visual change. Psychiatric: Denies depression, anxiety.  Denies suicidal thoughts or ideation.  Denies hallucinations.  Past Medical History:  Diagnosis Date  . Anemia   . Chronic combined systolic and diastolic CHF (congestive heart failure) ([place])    a. 07/2015  Echo: EF 45-50%, mod LVH, mid apical/antsept AK, Gr 1 DD.  [name] CKD (chronic kidney disease), stage III ([place])   . Coronary artery disease    a. 2012 s/p MI/PCI: s/p DES to mid/dist RCA and overlapping DES to LAD;  b. 07/2015 Cath: LAD 10% ISR, D1 40(jailed), LCX 87m OM2 30, OM3 30, RCA 513mSR, 10d ISR; c.  07/2016 NSTEMI/PCI: LAD 85ost/p/m (3.5x20 Synergy DES overlapping prior stent), D1 40, LCX 7540mM2 95 (2.75x16 Synergy DES), OM3 30, RCA 65m31md.  . Depression   . Diabetic gastroparesis (HCC)[place]. DKA (diabetic ketoacidoses) 03/14/2018  . GERD (gastroesophageal reflux disease)   . Gout   . Heart murmur   . Hyperlipemia   . Hypertension   . Hypertensive heart disease   . Ischemic cardiomyopathy    a. 07/2015 Echo: EF 45-50%, mod LVH, mid-apicalanteroseptal AK, Gr1 DD.  . Morbid obesity (HCC)[place]. OSA on CPAP   . Osteomyelitis (HCC)[place] a. 07/2016 MTP joint of R great toe s/p transmetatarsal amputation.  . Polyneuropathy in diabetes(357.2)   . Pulmonary sarcoidosis (HCC)[place] "problems w/it years ago; no problems anymore" (07/03/2014)  . Rectal bleeding   . Sleep apnea   . Type II diabetes mellitus (HCC)[place]  Past Surgical History:  Procedure Laterality Date  . AMPUTATION Right 07/24/2016   Procedure: TRANSMETATARSAL FOOT AMPUTATION;  Surgeon: MarcNewt Minion;  [facility];  Laterality: Right;  . BALLOON DILATION N/A 03/21/2018   Procedure: BALLOON DILATION;  Surgeon: BuccRonald Lobo;  [facility];  Service: Endoscopy;  Laterality: N/A;  . BREAST LUMPECTOMY Right ~ 2000   "benign"  . CARDIAC CATHETERIZATION N/A 07/30/2015   Procedure: Left Heart Cath and Coronary Angiography;  Surgeon: ChriBurnell Blanks;  [facility];  Service: Cardiovascular;  Laterality: N/A;  . CARDIAC CATHETERIZATION N/A 07/19/2016   Procedure: Left Heart Cath and Coronary Angiography;  Surgeon: HenrBelva Crome;  [facility];  Service: Cardiovascular;  Laterality: N/A;  . CARDIAC CATHETERIZATION N/A 07/29/2016   Procedure: Coronary Stent Intervention;  Surgeon: HenrBelva Crome;  [facility];  Service: Cardiovascular;  Laterality: N/A;  . CARDIAC CATHETERIZATION N/A 07/29/2016   Procedure: Intravascular  Ultrasound/IVUS;  Surgeon: HenrBelva Crome;  [facility];  Service: Cardiovascular;  Laterality: N/A;  . CATARACT EXTRACTION W/ INTRAOCULAR LENS  IMPLANT, BILATERAL Bilateral 2014-2015  . COLONOSCOPY WITH PROPOFOL N/A 08/22/2017   Procedure: COLONOSCOPY WITH PROPOFOL;  Surgeon: SchoWilford Corner;  [facility];  Laterality: N/A;  . CORONARY ANGIOPLASTY WITH STENT PLACEMENT  10/31/2011   30 % MID ATRIOVENTRICULAR GROOVE CX STENOSIS. 90 % MID RCA.PTA TO LAD/STENTING OF TANDEM 60-70%. LAD STENOSIS 99% REDUCED TO 0%.3.0 X 32MM PROMUS DES POSTDILATED TO 3.25MM.  . COMar[place] KitchenONARY ANGIOPLASTY WITH STENT PLACEMENT  07/03/2014   "2"  . CORONARY STENT INTERVENTION N/A 04/09/2019   Procedure: CORONARY STENT INTERVENTION;  Surgeon: VaraJettie Booze;  [facility];  Service: Cardiovascular;  Laterality: N/A;  . CORONARY STENT INTERVENTION N/A 08/22/2019   Procedure: CORONARY STENT INTERVENTION;  Surgeon: JordMartiniqueter M, MD;  [facility];  Service: Cardiovascular;  Laterality: N/A;  . ESOPHAGOGASTRODUODENOSCOPY (EGD) WITH PROPOFOL N/A 03/21/2018   Procedure: ESOPHAGOGASTRODUODENOSCOPY (EGD) WITH PROPOFOL;  Surgeon: BuccRonald Lobo;  [facility]  Service: Endoscopy;  Laterality: N/A;  . I & D EXTREMITY Right 10/30/2013   Procedure: IRRIGATION AND DEBRIDEMENT RIGHT SMALL FINGER POSSIBLE SECONDARY WOUND CLOSURE;  Surgeon: [name], MD;  Location: [facility];  Service: Orthopedics;  Laterality: Right;  [name] is available after 4pm  . I & D EXTREMITY Right 11/01/2013   Procedure:  IRRIGATION AND DEBRIDEMENT RIGHT  PROXIMAL PHALANGEAL LEVEL AMPUTATION;  Surgeon: [name], MD;  Location: [facility];  Service: Orthopedics;  Laterality: Right;  . INCISION AND DRAINAGE Right 10/28/2013   Procedure: INCISION AND DRAINAGE Right small finger;  Surgeon: [name], MD;  Location[facility];  Service:  Orthopedics;  Laterality: Right;  . LEFT HEART CATH Left 11/03/2011   Procedure: LEFT HEART CATH;  Surgeon: [name], MD;  [facility];  Service: Cardiovascular;  Laterality: Left;  . LEFT HEART CATH AND CORONARY ANGIOGRAPHY N/A 04/09/2019   Procedure: LEFT HEART CATH AND CORONARY ANGIOGRAPHY;  Surgeon: Jettie Booze, MD;  Location: [place] CV LAB;  Service: Cardiovascular;  Laterality: N/A;  . LEFT HEART CATHETERIZATION WITH CORONARY ANGIOGRAM N/A 07/03/2014   Procedure: LEFT HEART CATHETERIZATION WITH CORONARY ANGIOGRAM;  Surgeon: [name], MD;  Location: [facility] LAB;  Service: Cardiovascular;  Laterality: N/A;  . PERCUTANEOUS CORONARY STENT INTERVENTION (PCI-S) Left 11/03/2011   Procedure: PERCUTANEOUS CORONARY STENT INTERVENTION (PCI-S);  Surgeon: [name], MD;  Location: [facility] CATH LAB;  Service: Cardiovascular;  Laterality: Left;  . RIGHT/LEFT HEART CATH AND CORONARY ANGIOGRAPHY N/A 08/22/2019   Procedure: RIGHT/LEFT HEART CATH AND CORONARY ANGIOGRAPHY;  Surgeon: Martinique, Peter M, MD;  Location: [place] CV LAB;  Service: Cardiovascular;  Laterality: N/A;  . TOE AMPUTATION Right ~ 2010   "2nd toe"  . TOE AMPUTATION Right 2012   "3rd toe"    Social History  reports that he has never smoked. He has never used smokeless tobacco. He reports current alcohol use. He reports that he does not use drugs.  Allergies  Allergen Reactions  . Chlorthalidone Other (See Comments)    Causes dehydration, kidneys shut down    Family History  Problem Relation Age of Onset  . Heart attack Maternal Aunt   . Diabetes Maternal Grandmother   . Hypertension Maternal Grandmother      Prior to Admission medications   Medication Sig Start Date End Date Taking? Authorizing Provider  Accu-Chek FastClix Lancets MISC USE UP TO FOUR TIMES DAILY AS DIRECTED 04/10/19   [provider]  ACCU-CHEK GUIDE test strip USE UP TO FOUR TIMES DAILY AS DIRECTED 05/15/20    Philemon Kingdom, MD  acetaminophen (TYLENOL) 650 MG CR tablet Take 1,300 mg by mouth daily as needed for pain.    [provider]  allopurinol (ZYLOPRIM) 300 MG tablet Take 300 mg by mouth daily.     [provider]  amLODipine (NORVASC) 10 MG tablet Take 1 tablet (10 mg total) by mouth daily. 04/23/20   [name], MD  aspirin EC 81 MG tablet Take 81 mg by mouth daily.     [provider]  blood glucose meter kit and supplies Dispense based on patient and insurance preference. Use up to four times daily as directed. (FOR ICD-10 E10.9, E11.9). 04/09/19   [name], PA  ezetimibe (ZETIA) 10 MG tablet Take 1 tablet (10 mg total) by mouth daily. 11/21/19 11/15/20  [name], MD  furosemide (LASIX) 80 MG tablet Take 80 mg by mouth.  Takes 160 mg 3 days per week and 80 mg 4 days per week    [provider]  hydrALAZINE (APRESOLINE) 100 MG tablet Take 100 mg by mouth 3 (three) times daily.    [provider]  insulin aspart (NOVOLOG FLEXPEN) 100 UNIT/ML FlexPen Inject 10-15 Units into the skin 3 (three) times daily with meals. 06/06/20   Philemon Kingdom, MD  insulin glargine (LANTUS) 100 UNIT/ML injection Inject 0.3 mLs (30 Units total) into the skin 2 (two) times daily. 04/09/20   Philemon Kingdom, MD  isosorbide mononitrate (IMDUR) 120 MG 24 hr tablet Take 1 tablet (120 mg total) by mouth daily. 08/26/20   Kroeger, Lorelee Cover., PA-C  metoprolol succinate (TOPROL-XL) 50 MG 24 hr tablet Take 3 tablets (150 mg total) by mouth daily. Take with or immediately following a meal. 08/24/19 08/26/20  Darliss Cheney, MD  nitroGLYCERIN (NITROSTAT) 0.4 MG SL tablet Place 1 tablet (0.4 mg total) under the tongue every 5 (five) minutes as needed for chest pain. 01/12/19   Almyra Deforest, PA  pantoprazole (PROTONIX) 40 MG tablet TAKE 1 TABLET BY MOUTH TWICE DAILY BEFORE MEAL(S) 09/19/19   [name], MD  potassium chloride (KLOR-CON) 20 MEQ packet Take by mouth 2  (two) times daily. Unknown amount (2 pills daily)    [provider]  rosuvastatin (CRESTOR) 40 MG tablet Take 1 tablet by mouth once daily 10/17/19   Almyra Deforest, PA  ticagrelor (BRILINTA) 90 MG TABS tablet Take 1 tablet (90 mg total) by mouth 2 (two) times daily. 05/28/20   Almyra Deforest, PA  VICTOZA 18 MG/3ML SOPN INJECT 0.3 MLS (1.8MG TOTAL) INTO THE SKIN DAILY 08/25/20   Philemon Kingdom, MD    Physical Exam: Vitals:   09/24/20 1422 09/24/20 1821  BP: (!) 95/54 123/70  Pulse: 88 81  Resp: 15 18  Temp: 98.1 F (36.7 C)   SpO2: 96% 100%    Constitutional: NAD, calm, comfortable Vitals:   09/24/20 1422 09/24/20 1821  BP: (!) 95/54 123/70  Pulse: 88 81  Resp: 15 18  Temp: 98.1 F (36.7 C)   SpO2: 96% 100%   General: WDWN, Alert and oriented x3.  Eyes: EOMI, PERRL, lids and conjunctivae normal.  Sclera nonicteric HENT:  [place]/AT, external ears normal.  Nares patent without epistasis.  Mucous membranes are moist. Posterior pharynx clear of any exudate or lesions. Normal dentition.  Neck: Soft, normal range of motion, supple, no masses, no thyromegaly.  Trachea midline Respiratory: clear to auscultation bilaterally, no wheezing, no crackles. Normal respiratory effort. No accessory muscle use.  Cardiovascular: Regular rate and rhythm, no murmurs / rubs / gallops. No extremity edema. 2+ pedal pulses. Abdomen: Soft, no tenderness, nondistended, no rebound or guarding. Morbidly obese. No masses palpated. Bowel sounds normoactive. No CVA tenderness to palpation. Negative Murphy's sign or McBurney point tenderness. Musculoskeletal: FROM. no clubbing / cyanosis. No joint deformity upper and lower extremities. Normal muscle tone.  Skin: Warm, dry, intact no rashes, lesions, ulcers. No induration Neurologic: CN 2-12 grossly intact.  Normal speech.  Sensation intact, patella DTR +1 bilaterally. Strength 5/5 in all extremities.   Psychiatric: Normal judgment and insight.  Normal mood.     Labs on Admission: I have personally reviewed following labs and imaging studies  CBC: Recent Labs  Lab 09/24/20 1423  WBC 9.3  NEUTROABS 6.2  HGB 12.5*  HCT 38.6*  MCV 88.5  PLT 263    Basic Metabolic Panel: Recent Labs  Lab 09/24/20 1423  NA 133*  K 2.9*  CL 94*  CO2 21*  GLUCOSE 334*  BUN 71*  CREATININE 5.55*  CALCIUM 8.7*    GFR: CrCl cannot be calculated (Unknown ideal weight.).  Liver Function Tests: Recent Labs  Lab 09/24/20 1423  AST 202*  ALT 227*  ALKPHOS 172*  BILITOT 0.9  PROT 6.9  ALBUMIN 3.3*    Urine analysis:    Component Value Date/Time   COLORURINE YELLOW 09/24/2020 1423   APPEARANCEUR CLOUDY (A) 09/24/2020 1423   LABSPEC 1.011 09/24/2020 1423   PHURINE 5.0 09/24/2020 1423   GLUCOSEU 150 (A) 09/24/2020 1423   HGBUR LARGE (A) 09/24/2020 1423   BILIRUBINUR NEGATIVE 09/24/2020 1423   [place] 09/24/2020 1423   PROTEINUR 100 (A) 09/24/2020 1423   UROBILINOGEN 0.2 11/01/2011 1410   NITRITE NEGATIVE 09/24/2020 1423   LEUKOCYTESUR NEGATIVE 09/24/2020 1423    Radiological Exams on Admission: US Abdomen Complete  Result Date: 09/24/2020 CLINICAL DATA:  Acute renal injury and elevated LFTs EXAM: ABDOMEN ULTRASOUND COMPLETE COMPARISON:  None. FINDINGS: Gallbladder: No gallstones or wall thickening visualized. No sonographic Murphy sign noted by sonographer. Common bile duct: Diameter: 5 mm. Liver: No focal lesion identified. Within normal limits in parenchymal echogenicity. Portal vein is patent on color Doppler imaging with normal direction of blood flow towards the liver. IVC: No abnormality visualized. Pancreas: Visualized portion unremarkable. Spleen: Size and appearance within normal limits. Right Kidney: Length: 9.9 cm. Echogenicity within normal limits. No mass or hydronephrosis visualized. Left Kidney: Length: 9.0 cm. Echogenicity within normal limits. No mass or hydronephrosis visualized. Abdominal aorta: No aneurysm  visualized. Other findings: None. IMPRESSION: No acute abnormality noted. Electronically Signed   By: Inez Catalina M.D.   On: 09/24/2020 22:28    EKG: Independently reviewed.  EKG shows normal sinus rhythm with nonspecific ST changes but no acute ST elevation or depression.  QTc 485  Assessment/Plan Principal Problem:   Acute renal failure superimposed on stage 3 chronic kidney disease [facility]) [name] will be admitted to [place] floor.  IV fluid hydration with LR at 75 mils per hour overnight. Recheck electrolytes and renal function in morning. Renal ultrasound was obtained to the emergency room and showed no obstruction as cause of acute renal failure If renal function does not improve with IV fluid hydration overnight will consult nephrology for evaluation morning  Active Problems:    Hyperkalemia Potassium is repleted. Will recheck electrolytes in am.  Magnesium level has been ordered and if low will replete.     Essential hypertension Continue home medications of amlodipine, hydralazine, Imdur. Will need to verify if patient has been taking metoprolol and if he was will be resumed in the morning    Type 2 diabetes mellitus with stage 3 chronic kidney disease ([place]) Patient hemoglobin A1c that was just over 745 days ago so not repeated at this time.  Patient be continued on his home Lantus and Victoza dose.  Blood sugars were monitored with meals and bedtime.  Sliding Multivite as needed for glycemic control.    Morbid obesity due to excess calories ([place]) Follow-up with PCP for dietary and lifestyle interventions for weight loss    Diabetic polyneuropathy associated with type 2 diabetes mellitus ([place]) Chronic and stable    DVT prophylaxis: Heparin for DVT prophylaxis. Code Status:   Full code Family Communication:  Diagnosis plan discussed with patient.  Patient verbalized understanding agrees with plan.  Questions were answered.  Further recommendation follow as clinical  indicated Disposition  Plan:   Patient is from:  Home  Anticipated DC to:  Home  Anticipated DC date:  Anticipate greater than 2 midnights in the hospital to treat acute medical condition  Anticipated DC barriers: No barriers to discharge identified at this time  Consults called:  If kidney function does not improve with morning labs nephrology will be consulted Admission status:  Inpatient  Severity of Illness: The appropriate patient status for this patient is INPATIENT. Inpatient status is judged to be reasonable and necessary in order to provide the required intensity of service to ensure the patient's safety. The patient's presenting symptoms, physical exam findings, and initial radiographic and laboratory data in the context of their chronic comorbidities is felt to place them at high risk for further clinical deterioration. Furthermore, it is not anticipated that the patient will be medically stable for discharge from the hospital within 2 midnights of admission. The following factors support the patient status of inpatient.   * I certify that at the point of admission it is my clinical judgment that the patient will require inpatient hospital care spanning beyond 2 midnights from the point of admission due to high intensity of service, high risk for further deterioration and high frequency of surveillance required.[name]  MD [facility]  How to contact the [facility] Attending or Consulting provider [place] or covering provider during after hours [place], for this patient?   1. Check the care team in [facility] and look for a) attending/consulting [facility] provider listed and b) the [facility] team listed 2. Log into [email or web address] and use [employer]'s universal password to access. If you do not have the password, please contact the hospital operator. 3. Locate the [facility] provider you are looking for under [facility] and page to a number that you can be directly reached. 4. If you  still have difficulty reaching the provider, please page the [facility] (Director on Call) for the Hospitalists listed on amion for assistance.  09/25/2020, 12:00 AM

## 2020-09-25 NOTE — Progress Notes (Signed)
Placed patient on CPAP for the night via auto-mode.

## 2020-09-25 NOTE — ED Notes (Signed)
cbg 364

## 2020-09-26 DIAGNOSIS — E1142 Type 2 diabetes mellitus with diabetic polyneuropathy: Secondary | ICD-10-CM | POA: Diagnosis not present

## 2020-09-26 DIAGNOSIS — N179 Acute kidney failure, unspecified: Secondary | ICD-10-CM | POA: Diagnosis not present

## 2020-09-26 DIAGNOSIS — I10 Essential (primary) hypertension: Secondary | ICD-10-CM | POA: Diagnosis not present

## 2020-09-26 DIAGNOSIS — E876 Hypokalemia: Secondary | ICD-10-CM | POA: Diagnosis not present

## 2020-09-26 LAB — GLUCOSE, CAPILLARY
Glucose-Capillary: 134 mg/dL — ABNORMAL HIGH (ref 70–99)
Glucose-Capillary: 194 mg/dL — ABNORMAL HIGH (ref 70–99)
Glucose-Capillary: 244 mg/dL — ABNORMAL HIGH (ref 70–99)
Glucose-Capillary: 238 mg/dL — ABNORMAL HIGH (ref 70–99)

## 2020-09-26 LAB — COMPREHENSIVE METABOLIC PANEL
ALT: 207 U/L — ABNORMAL HIGH (ref 0–44)
AST: 205 U/L — ABNORMAL HIGH (ref 15–41)
Albumin: 3.1 g/dL — ABNORMAL LOW (ref 3.5–5.0)
Alkaline Phosphatase: 141 U/L — ABNORMAL HIGH (ref 38–126)
Anion gap: 14 (ref 5–15)
BUN: 68 mg/dL — ABNORMAL HIGH (ref 8–23)
CO2: 23 mmol/L (ref 22–32)
Calcium: 8.7 mg/dL — ABNORMAL LOW (ref 8.9–10.3)
Chloride: 99 mmol/L (ref 98–111)
Creatinine, Ser: 4.98 mg/dL — ABNORMAL HIGH (ref 0.61–1.24)
GFR, Estimated: 12 mL/min — ABNORMAL LOW (ref >60)
Glucose, Bld: 189 mg/dL — ABNORMAL HIGH (ref 70–99)
Potassium: 2.7 mmol/L — CL (ref 3.5–5.1)
Sodium: 136 mmol/L (ref 135–145)
Total Bilirubin: 0.6 mg/dL (ref 0.3–1.2)
Total Protein: 6.5 g/dL (ref 6.5–8.1)

## 2020-09-26 LAB — MAGNESIUM: Magnesium: 2.3 mg/dL (ref 1.7–2.4)

## 2020-09-26 MED ORDER — POTASSIUM CHLORIDE CRYS ER 20 MEQ PO TBCR
40.0000 meq | EXTENDED_RELEASE_TABLET | ORAL | Status: AC
  Administered 2020-09-26 (×2): 40 meq via ORAL
  Filled 2020-09-26 (×2): qty 2

## 2020-09-26 MED ORDER — POTASSIUM CHLORIDE CRYS ER 20 MEQ PO TBCR
40.0000 meq | EXTENDED_RELEASE_TABLET | Freq: Once | ORAL | Status: AC
  Administered 2020-09-26: 40 meq via ORAL
  Filled 2020-09-26: qty 2

## 2020-09-26 NOTE — Progress Notes (Signed)
Inpatient Diabetes Program Recommendations  AACE/ADA: New Consensus Statement on Inpatient Glycemic Control (2015)  Target Ranges:  Prepandial:   less than 140 mg/dL      Peak postprandial:   less than 180 mg/dL (1-2 hours)      Critically ill patients:  140 - 180 mg/dL   Lab Results  Component Value Date   GLUCAP 134 (H) 09/26/2020   HGBA1C 7.5 (A) 08/15/2020    Review of Glycemic Control Results for Joshua [name], Joshua [name] (MRN [id number]) as of 09/26/2020 11:11  Ref. Range 09/25/2020 07:37 09/25/2020 12:33 09/25/2020 16:33 09/25/2020 20:53 09/26/2020 06:45  Glucose-Capillary Latest Ref Range: 70 - 99 mg/dL 364 (H) 399 (H) 381 (H) 369 (H) 134 (H)   Diabetes history: DM 2 Outpatient Diabetes medications: Lantus 30 units bid, Novolog 10-15 units tid, Victoza 1.8 Current orders for Inpatient glycemic control:  Lantus 15 units bid Novolog 0-9 units tid + hs  Inpatient Diabetes Program Recommendations:    Pt received a total of 45 units of Lantus yesterday  - Consider Lantus 30 units qam and 15 units qpm as given yesterday. Glucose 134 this am.  Thanks,  Tama Headings RN, MSN, BC-ADM Inpatient Diabetes Coordinator Team Pager [phone] (8a-5p)

## 2020-09-26 NOTE — Progress Notes (Addendum)
PROGRESS NOTE    Joshua [name]  BJY:[id number] DOB: 09/14/1956 DOA: 09/24/2020 PCP: [name], MD    Brief Narrative:  [name] was admitted with a working diagnosis of acute kidney injury on chronic kidney disease stage III to IV, complicated with hypokalemia.  64 year old male with past medical history for coronary artery disease status post angioplasty, chronic diastolic heart failure, hypertension, dyslipidemia, type 2 diabetes mellitus, chronic kidney disease stage IIIb to IV, obstructive sleep apnea and obesity class III.  Patient reported decreased energy, easy fatigability, poor appetite and low oral intake for about 2 weeks.  Patient takes high doses of furosemide at home.  He was seen at his nephrologist office [name] and blood work was performed.  He was calling to come to the hospital due to worsening kidney function and hypokalemia.  On his initial physical examination blood pressure 95/54, 123/70, heart rate 81, respiratory rate 18, oxygen saturation 96%.  His lungs were clear to auscultation bilaterally, heart S1-S2, present with me, abdomen protuberant, nontender, no lower extremity edema. Sodium 133, potassium 2.9, chloride 94, bicarb 21, glucose 334, BUN 71, creatinine 5.5, anion gap 18, AST 202, ALT 227, white count 9.3, hemoglobin 12.5, hematocrit 38.6, platelets 193.  SARS COVID-19 negative.  Urinalysis specific gravity 1.011, 100 protein, 0-5 red cells, 6-10 white cells.  EKG 74 bpm, normal axis, first-degree AV block, sinus rhythm with poor R wave progression, no significant ST segment or T wave changes.  Diuretic therapy has been on hold, patient has been placed on isotonic saline IV and Kcl supplements.  Renal function has been stable, but continue to have hypokalemia.    Assessment & Plan:   Principal Problem:   Acute renal failure superimposed on stage 3 chronic kidney disease (HCC) Active Problems:   Morbid obesity due to excess calories (HCC)    Diabetic polyneuropathy associated with type 2 diabetes mellitus ([place])   Essential hypertension   Type 2 diabetes mellitus with stage 3 chronic kidney disease (HCC)   Hypokalemia   1. Aki on CKD stage 3b to 4/ hypokalemia.  Baseline creatinine is 2 to 3. US of the abdomen with no hydronephrosis.  Patient responding well to supportive medical therapy but continue to have hypokalemia, renal function today with cr at 4.98, K is 2.7, Na 136 and CL 99, bicarbonate 23. Documented urine output is 1,750 ml over last 24 H.   Tolerating well IV fluids with isotonic saline at 50 ml per H. Continue K correction, this am had 40 meq, will add 80 more in 2 dived doses and will follow up on renal function and electrolytes in am.   If renal function and K improves, possible dc home in am. May have to hold on furosemide until follow up as outpatient or signs of volume overload.   2. T2DM uncontrolled/ dyslipidemia. Fasting glucose is 189, continue with lower dose of basal insulin, 50% less than his home dose. Continue with insulin sliding scale for glucose cover and monitoring.   On ezetimibe for dyslipidemia.   3. HTN. Blood pressure 161/88, continue close monitoring, if continue to rise will plan to resume low dose of amlodipine.   4. Obesity class 3. BMI calculated is 41.6.    5. Elevated liver enzymes. Stable elevation of liver enzymes. AST 205 and ALT 207, liver US with no acute changes. Continue close follow up on on liver function as outpatient.   6. CAD. No chest pain, continue with ticagrelor and aspirin,  Status is: Inpatient  Remains inpatient appropriate because:IV treatments appropriate due to intensity of illness or inability to take PO   Dispo: The patient is from: Home              Anticipated d/c is to: Home              Anticipated d/c date is: 1 day              Patient currently is not medically stable to d/c. possible dc home in am, if renal function continue to  improve, along with normokalemia.     DVT prophylaxis: Heparin   Code Status:   full  Family Communication:  No family at the bedside    Subjective: Patient is out of bed to chair, no nausea or vomiting, no chest pain or dyspnea, tolerating po well.   Objective: Vitals:   09/25/20 2240 09/25/20 2352 09/26/20 0455 09/26/20 0946  BP:  119/77 122/74 (!) 161/88  Pulse: 85 71 75 82  Resp: $Remo'18 18 17 18  'UYqsP$ Temp:  98.1 F (36.7 C) 98.5 F (36.9 C) 98.2 F (36.8 C)  TempSrc:  Oral Oral Oral  SpO2: 95% 98% 97% 97%  Weight:      Height:        Intake/Output Summary (Last 24 hours) at 09/26/2020 1408 Last data filed at 09/26/2020 1016 Gross per 24 hour  Intake 1154.09 ml  Output 2250 ml  Net -1095.91 ml   Filed Weights   09/25/20 1202 09/25/20 2055  Weight: 125.3 kg 126.5 kg    Examination:   General: Not in pain or dyspnea,  Neurology: Awake and alert, non focal  E ENT: no pallor, no icterus, oral mucosa moist Cardiovascular: No JVD. S1-S2 present, rhythmic, no gallops, rubs, or murmurs. No lower extremity edema. Pulmonary: positive breath sounds bilaterally, with no wheezing, rhonchi or rales. Gastrointestinal. Abdomen soft and non tender Skin. No rashes Musculoskeletal: no joint deformities     Data Reviewed: I have personally reviewed following labs and imaging studies  CBC: Recent Labs  Lab 09/24/20 1423 09/25/20 0157  WBC 9.3 8.2  NEUTROABS 6.2  --   HGB 12.5* 11.8*  HCT 38.6* 37.7*  MCV 88.5 88.5  PLT 193 468   Basic Metabolic Panel: Recent Labs  Lab 09/24/20 1423 09/24/20 2305 09/25/20 0157 09/26/20 0255  NA 133*  --  134* 136  K 2.9*  --  3.0* 2.7*  CL 94*  --  96* 99  CO2 21*  --  22 23  GLUCOSE 334*  --  328* 189*  BUN 71*  --  75* 68*  CREATININE 5.55* 5.73* 5.57* 4.98*  CALCIUM 8.7*  --  8.5* 8.7*  MG  --  2.4  --  2.3   GFR: Estimated Creatinine Clearance: 20.2 mL/min (A) (by C-G formula based on SCr of 4.98 mg/dL (H)). Liver  Function Tests: Recent Labs  Lab 09/24/20 1423 09/26/20 0255  AST 202* 205*  ALT 227* 207*  ALKPHOS 172* 141*  BILITOT 0.9 0.6  PROT 6.9 6.5  ALBUMIN 3.3* 3.1*   Recent Labs  Lab 09/24/20 2305  LIPASE 61*   No results for input(s): AMMONIA in the last 168 hours. Coagulation Profile: No results for input(s): INR, PROTIME in the last 168 hours. Cardiac Enzymes: No results for input(s): CKTOTAL, CKMB, CKMBINDEX, TROPONINI in the last 168 hours. BNP (last 3 results) No results for input(s): PROBNP in the last 8760 hours. HbA1C: No results for  input(s): HGBA1C in the last 72 hours. CBG: Recent Labs  Lab 09/25/20 1233 09/25/20 1633 09/25/20 2053 09/26/20 0645 09/26/20 1132  GLUCAP 399* 381* 369* 134* 194*   Lipid Profile: No results for input(s): CHOL, HDL, LDLCALC, TRIG, CHOLHDL, LDLDIRECT in the last 72 hours. Thyroid Function Tests: No results for input(s): TSH, T4TOTAL, FREET4, T3FREE, THYROIDAB in the last 72 hours. Anemia Panel: No results for input(s): VITAMINB12, FOLATE, FERRITIN, TIBC, IRON, RETICCTPCT in the last 72 hours.    Radiology Studies: I have reviewed all of the imaging during this hospital visit personally     Scheduled Meds: . aspirin EC  81 mg Oral Daily  . ezetimibe  10 mg Oral Daily  . heparin  5,000 Units Subcutaneous Q8H  . insulin aspart  0-5 Units Subcutaneous QHS  . insulin aspart  0-9 Units Subcutaneous TID WC  . insulin glargine  15 Units Subcutaneous BID  . potassium chloride  40 mEq Oral Q4H  . ticagrelor  90 mg Oral BID   Continuous Infusions: . sodium chloride 50 mL/hr at 09/26/20 1051     LOS: 2 days        Cosmo Tetreault Gerome Apley, MD

## 2020-09-26 NOTE — Evaluation (Signed)
Occupational Therapy Evaluation Patient Details Name: Joshua [name] MRN: [id number] DOB: 31-Dec-1955 Today's Date: 09/26/2020    History of Present Illness The pt is a 64 yo male presenting with decreased energy, fatigue, and poor oral intake for 2 weeks PTA. Upon admission, pt found with AKI on CKD III, hypokalemia. PMH includes: CAD s/p angioplasty, diastolic CHF, HTN, dyslipidemia, DM II, OSA, and obesity.    Clinical Impression   PTA patient reports independent with ADLs, mobility and driving, but endorses frequent falls and almost falls (almost daily).  Admitted for above and limited by problem list below, including impaired balance and decreased activity tolerance. Patient currently completing ADLs with min guard to supervision, transfers and mobility with min guard to supervision. Mild unsteadiness with no UE support during mobility and dynamic standing tasks; significantly improved with UE support therefore recommend use of RW. Based on performance today, he will benefit from further OT services acutely to optimize safety/independence with ADLs, but anticipate no further needs after dc home. Will follow.     Follow Up Recommendations  No OT follow up;Supervision - Intermittent    Equipment Recommendations  3 in 1 bedside commode;Other (comment) (for shower chair, RW  )    Recommendations for Other Services       Precautions / Restrictions Precautions Precautions: Fall Restrictions Weight Bearing Restrictions: No      Mobility Bed Mobility Overal bed mobility: Modified Independent                Transfers Overall transfer level: Needs assistance   Transfers: Sit to/from Stand Sit to Stand: Min guard;Supervision         General transfer comment: min guard to supervision for safety, mild unsteadiness    Balance Overall balance assessment: Needs assistance Sitting-balance support: No upper extremity supported;Feet supported Sitting balance-Leahy Scale:  Good     Standing balance support: No upper extremity supported;During functional activity Standing balance-Leahy Scale: Fair Standing balance comment: mild unsteadiness                            ADL either performed or assessed with clinical judgement   ADL Overall ADL's : Needs assistance/impaired     Grooming: Min guard;Standing   Upper Body Bathing: Set up;Sitting   Lower Body Bathing: Min guard;Sit to/from stand   Upper Body Dressing : Supervision/safety;Sitting   Lower Body Dressing: Min guard;Sit to/from stand   Toilet Transfer: Min guard;Ambulation           Functional mobility during ADLs: Min guard;Cueing for safety General ADL Comments: pt limited by mild unsteadiness with mobility, decreased activity tolerance     Vision Baseline Vision/History: No visual deficits;Cataracts (cataracts removed ) Patient Visual Report: No change from baseline Vision Assessment?: No apparent visual deficits     Perception     Praxis      Pertinent Vitals/Pain Pain Assessment: No/denies pain     Hand Dominance Right   Extremity/Trunk Assessment Upper Extremity Assessment Upper Extremity Assessment: Overall WFL for tasks assessed (hx of neuropathy and R shoulder injury)   Lower Extremity Assessment Lower Extremity Assessment: Defer to PT evaluation       Communication Communication Communication: No difficulties   Cognition Arousal/Alertness: Awake/alert Behavior During Therapy: WFL for tasks assessed/performed Overall Cognitive Status: Within Functional Limits for tasks assessed  General Comments       Exercises     Shoulder Instructions      Home Living Family/patient expects to be discharged to:: Private residence Living Arrangements: Spouse/significant other;Children (adult children) Available Help at Discharge: Family;Available 24 hours/day Type of Home: House Home Access: Level  entry     Home Layout: One level     Bathroom Shower/Tub: Walk-in shower;Tub/shower unit   Bathroom Toilet: Standard     Home Equipment: Other (comment);Walker - 2 wheels (ski poles)   Additional Comments: has been sitting down in tub, due to balance issues       Prior Functioning/Environment Level of Independence: Independent        Comments: reports 3-4 falls in the last 6 months, and "a lot of almost misses" " a couple time a day"' independent ADLs; drives           OT Problem List: Decreased strength;Decreased activity tolerance;Impaired balance (sitting and/or standing);Decreased knowledge of use of DME or AE;Decreased knowledge of precautions      OT Treatment/Interventions: Self-care/ADL training;Therapeutic exercise;DME and/or AE instruction;Patient/family education;Balance training;Therapeutic activities    OT Goals(Current goals can be found in the care plan section) Acute Rehab OT Goals Patient Stated Goal: home  OT Goal Formulation: With patient Time For Goal Achievement: 10/10/20 Potential to Achieve Goals: Good  OT Frequency: Min 2X/week   Barriers to D/C:            Co-evaluation              AM-PAC OT "6 Clicks" Daily Activity     Outcome Measure Help from another person eating meals?: None Help from another person taking care of personal grooming?: A Little Help from another person toileting, which includes using toliet, bedpan, or urinal?: A Little Help from another person bathing (including washing, rinsing, drying)?: A Little Help from another person to put on and taking off regular upper body clothing?: A Little Help from another person to put on and taking off regular lower body clothing?: A Little 6 Click Score: 19   End of Session Equipment Utilized During Treatment: Gait belt Nurse Communication: Mobility status  Activity Tolerance: Patient tolerated treatment well Patient left: with call bell/phone within reach;Other (comment)  (with PT )  OT Visit Diagnosis: Other abnormalities of gait and mobility (R26.89);Muscle weakness (generalized) (M62.81)                Time: 1594-5859 OT Time Calculation (min): 14 min Charges:  OT General Charges $OT Visit: 1 Visit OT Evaluation $OT Eval Moderate Complexity: 1 Mod  Jolaine Artist, OT Acute Rehabilitation Services Pager [phone] Office [phone]   Delight Stare 09/26/2020, 10:18 AM

## 2020-09-26 NOTE — Progress Notes (Signed)
CRITICAL VALUE ALERT  Critical Value:  Potassium - 2.7  Date & Time Notied: 09/26/20 @ 0513am   Provider Notified: Dr. Marlowe Sax notified  Orders Received/Actions taken: Orders received in [place].

## 2020-09-26 NOTE — Plan of Care (Signed)
  Problem: Education: Goal: Knowledge of General Education information will improve Description Including pain rating scale, medication(s)/side effects and non-pharmacologic comfort measures Outcome: Progressing   

## 2020-09-26 NOTE — Evaluation (Signed)
Physical Therapy Evaluation Patient Details Name: Joshua [name] MRN: [id number] DOB: 03-Oct-1956 Today's Date: 09/26/2020   History of Present Illness  The pt is a 64 yo male presenting with decreased energy, fatigue, and poor oral intake for 2 weeks PTA. Upon admission, pt found with AKI on CKD III, hypokalemia. PMH includes: CAD s/p angioplasty, diastolic CHF, HTN, dyslipidemia, DM II, OSA, and obesity.     Clinical Impression  Pt in bed upon arrival of PT, agreeable to evaluation at this time. Prior to admission the pt was independent with mobility and ADLs, intermittently using ski poles for balance, but otherwise mobilizing well and participating in local theater productions. He lives at home with his family in a home with no steps to enter, but would like to be able to navigate 3 steps to get onto stage without issue. The pt now presents with limitations in functional mobility, activity tolerance, and dynamic stability due to above dx, and will continue to benefit from skilled PT to address these deficits. The pt was able to complete 2 bouts of hallway ambulation, but reports increased comfort and stability at this time with use of a RW (not using AD at baseline). 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      Follow Up Recommendations Outpatient PT;Supervision - Intermittent (OPPT for balance and back pain)    Equipment Recommendations  Rolling walker with 5" wheels    Recommendations for Other Services       Precautions / Restrictions Precautions Precautions: Fall Precaution Comments: pt reports ~1 stumble per day and 4-5 falls in 6 months Restrictions Weight Bearing Restrictions: No      Mobility  Bed Mobility Overal bed mobility: Modified Independent                 Transfers Overall transfer level: Needs assistance Equipment used: None;Rolling walker (2 wheeled) Transfers: Sit to/from Stand Sit to Stand: Min guard;Supervision         General transfer comment: min guard to supervision for safety, mild unsteadiness  Ambulation/Gait Ambulation/Gait assistance: Min guard;Supervision Gait Distance (Feet): 75 Feet (+ 200 ft) Assistive device: None;Rolling walker (2 wheeled) Gait Pattern/deviations: Step-through pattern;Decreased stride length Gait velocity: 0.36 m/s Gait velocity interpretation: <1.31 ft/sec, indicative of household ambulator General Gait Details: slight increase in lateral movement, pt reports minor increased discomfort/sensation (states it is not pain) without use of AD, improved with BUE support on RW  Stairs Stairs:  (pt would like to practice stairs for acess to stage)             Balance Overall balance assessment: Needs assistance Sitting-balance support: No upper extremity supported;Feet supported Sitting balance-Leahy Scale: Good Sitting balance - Comments: able to reach dynamically for BLE, no LOB   Standing balance support: No upper extremity supported;During functional activity Standing balance-Leahy Scale: Fair Standing balance comment: mild unsteadiness without AD for gait, improved with RW                             Pertinent Vitals/Pain Pain Assessment: No/denies pain    Home Living Family/patient expects to be discharged to:: Private residence Living Arrangements: Spouse/significant other;Children (adult children) Available Help at Discharge: Family;Available 24 hours/day Type of Home: House Home Access: Level entry  Home Layout: One level Home Equipment: Walker - 2 wheels (ski poles for balance) Additional Comments: has been sitting down in tub, due to balance issues     Prior Function Level of Independence: Independent         Comments: reports 3-4 falls in the last 6  months, and "a lot of almost misses" " a couple time a day"' independent ADLs; drives        Hand Dominance   Dominant Hand: Right    Extremity/Trunk Assessment   Upper Extremity Assessment Upper Extremity Assessment: Defer to OT evaluation    Lower Extremity Assessment Lower Extremity Assessment: Overall WFL for tasks assessed (R transmet amp, hx of neuopathy in BLE)    Cervical / Trunk Assessment Cervical / Trunk Assessment: Other exceptions Cervical / Trunk Exceptions: large body habitus  Communication   Communication: No difficulties  Cognition Arousal/Alertness: Awake/alert Behavior During Therapy: WFL for tasks assessed/performed Overall Cognitive Status: Within Functional Limits for tasks assessed                                 General Comments: pleasant and agreeable      General Comments General comments (skin integrity, edema, etc.): VSS, pt reports no pain or onset of dizziness sx    Exercises     Assessment/Plan    PT Assessment Patient needs continued PT services  PT Problem List Decreased activity tolerance;Decreased balance;Decreased mobility       PT Treatment Interventions Gait training;Stair training;Functional mobility training;Therapeutic exercise;Balance training;Patient/family education    PT Goals (Current goals can be found in the Care Plan section)  Acute Rehab PT Goals Patient Stated Goal: home, to be able to stand/walk for duration of the play he is currently in PT Goal Formulation: With patient Time For Goal Achievement: 10/10/20 Potential to Achieve Goals: Good    Frequency Min 3X/week    AM-PAC PT "6 Clicks" Mobility  Outcome Measure Help needed turning from your back to your side while in a flat bed without using bedrails?: None Help needed moving from lying on your back to sitting on the side of a flat bed without using bedrails?: None Help needed moving to and from a bed to a chair (including a wheelchair)?: A  Little Help needed standing up from a chair using your arms (e.g., wheelchair or bedside chair)?: A Little Help needed to walk in hospital room?: A Little Help needed climbing 3-5 steps with a railing? : A Little 6 Click Score: 20    End of Session Equipment Utilized During Treatment: Gait belt Activity Tolerance: Patient tolerated treatment well Patient left: in chair;with call bell/phone within reach Nurse Communication: Mobility status PT Visit Diagnosis: Difficulty in walking, not elsewhere classified (R26.2)    Time: 8250-0370 PT Time Calculation (min) (ACUTE ONLY): 23 min   Charges:   PT Evaluation $PT Eval Low Complexity: 1 Low PT Treatments $Gait Training: 8-22 mins        Karma Ganja, PT, DPT   Acute Rehabilitation Department Pager #: [phone]  Otho Bellows 09/26/2020, 10:51 AM

## 2020-09-27 DIAGNOSIS — N179 Acute kidney failure, unspecified: Secondary | ICD-10-CM | POA: Diagnosis not present

## 2020-09-27 DIAGNOSIS — N183 Chronic kidney disease, stage 3 unspecified: Secondary | ICD-10-CM | POA: Diagnosis not present

## 2020-09-27 LAB — GLUCOSE, CAPILLARY
Glucose-Capillary: 197 mg/dL — ABNORMAL HIGH (ref 70–99)
Glucose-Capillary: 269 mg/dL — ABNORMAL HIGH (ref 70–99)
Glucose-Capillary: 361 mg/dL — ABNORMAL HIGH (ref 70–99)
Glucose-Capillary: 372 mg/dL — ABNORMAL HIGH (ref 70–99)

## 2020-09-27 LAB — BASIC METABOLIC PANEL
Anion gap: 10 (ref 5–15)
BUN: 55 mg/dL — ABNORMAL HIGH (ref 8–23)
CO2: 23 mmol/L (ref 22–32)
Calcium: 8.9 mg/dL (ref 8.9–10.3)
Chloride: 106 mmol/L (ref 98–111)
Creatinine, Ser: 3.85 mg/dL — ABNORMAL HIGH (ref 0.61–1.24)
GFR, Estimated: 16 mL/min — ABNORMAL LOW (ref >60)
Glucose, Bld: 205 mg/dL — ABNORMAL HIGH (ref 70–99)
Potassium: 3.6 mmol/L (ref 3.5–5.1)
Sodium: 139 mmol/L (ref 135–145)

## 2020-09-27 MED ORDER — SODIUM CHLORIDE 0.9 % IV SOLN: INTRAVENOUS | Status: DC

## 2020-09-27 NOTE — Progress Notes (Addendum)
Occupational Therapy Treatment Patient Details Name: Joshua [name] MRN: [id number] DOB: [birth year] Today's Date: 09/27/2020    History of present illness The pt is a 64 yo male presenting with decreased energy, fatigue, and poor oral intake for 2 weeks PTA. Upon admission, pt found with AKI on CKD III, hypokalemia. PMH includes: CAD s/p angioplasty, diastolic CHF, HTN, dyslipidemia, DM II, OSA, and obesity.    OT comments  Patient supine in bed and agreeable to OT session. Reviewed fall prevention techniques and home safety.  Educated on LB ADL safety for bathing seated, dressing seated and use of 3:1 for shower chair in shower.  Simulated LB ADls, shower transfers with supervision. Thankful for recommendation of 3:1 for dc home. Agreeable to use RW for uneven surfaces, voiced understanding of fall prevention in the home. No further OT needs identified at this time, OT will sign off.    Follow Up Recommendations  No OT follow up;Supervision - Intermittent    Equipment Recommendations  3 in 1 bedside commode;Other (comment) (for shower chair, RW )    Recommendations for Other Services      Precautions / Restrictions Precautions Precautions: Fall Precaution Comments: pt reports ~1 stumble per day and 4-5 falls in 6 months Restrictions Weight Bearing Restrictions: No       Mobility Bed Mobility Overal bed mobility: Modified Independent                Transfers Overall transfer level: Needs assistance Equipment used: None Transfers: Sit to/from Stand Sit to Stand: Supervision         General transfer comment: supervision for safety, line mgmt     Balance Overall balance assessment: Needs assistance Sitting-balance support: No upper extremity supported;Feet supported Sitting balance-Leahy Scale: Good     Standing balance support: No upper extremity supported;During functional activity Standing balance-Leahy Scale: Fair Standing balance comment: mild  unsteadiness                            ADL either performed or assessed with clinical judgement   ADL Overall ADL's : Needs assistance/impaired             Lower Body Bathing: Supervison/ safety;Sitting/lateral leans;Sit to/from stand Lower Body Bathing Details (indicate cue type and reason): reviewed techniques for safety seated, if standing with UE support minimally     Lower Body Dressing: Supervision/safety;Sit to/from stand   Toilet Transfer: Supervision/safety;Ambulation Toilet Transfer Details (indicate cue type and reason): denies use of RW in room     Tub/ Shower Transfer: Walk-in shower;Supervision/safety;3 in Risk manager Details (indicate cue type and reason): reviewed technique for safety and use of 3:1 for walk in shower  Functional mobility during ADLs: Supervision/safety General ADL Comments: pt agreeable to recommendations for increased safety/fall prevention     Vision       Perception     Praxis      Cognition Arousal/Alertness: Awake/alert Behavior During Therapy: WFL for tasks assessed/performed Overall Cognitive Status: Within Functional Limits for tasks assessed                                          Exercises     Shoulder Instructions       General Comments discussed fall prevention and home safety, patient reports increased concern with uneven surfaces (ie walking from car to  home)--discussed role of recommended OP PT services after dc     Pertinent Vitals/ Pain       Pain Assessment: No/denies pain  Home Living                                          Prior Functioning/Environment              Frequency  Min 2X/week        Progress Toward Goals  OT Goals(current goals can now be found in the care plan section)  Progress towards OT goals: Goals met/education completed, patient discharged from OT  Acute Rehab OT Goals Patient Stated Goal: home, to be  able to stand/walk for duration of the play he is currently in OT Goal Formulation: With patient  Plan All goals met and education completed, patient discharged from OT services    Co-evaluation                 AM-PAC OT "6 Clicks" Daily Activity     Outcome Measure   Help from another person eating meals?: None Help from another person taking care of personal grooming?: A Little Help from another person toileting, which includes using toliet, bedpan, or urinal?: A Little Help from another person bathing (including washing, rinsing, drying)?: A Little Help from another person to put on and taking off regular upper body clothing?: A Little Help from another person to put on and taking off regular lower body clothing?: A Little 6 Click Score: 19    End of Session    OT Visit Diagnosis: Other abnormalities of gait and mobility (R26.89);Muscle weakness (generalized) (M62.81)   Activity Tolerance Patient tolerated treatment well   Patient Left with call bell/phone within reach;Other (comment) (seated EOB)   Nurse Communication Mobility status        Time: 1050-1104 OT Time Calculation (min): 14 min  Charges: OT General Charges $OT Visit: 1 Visit OT Treatments $Self Care/Home Management : 8-22 mins  Jolaine Artist, OT Acute Rehabilitation Services Pager [phone] Office [phone]    Delight Stare 09/27/2020, 11:27 AM

## 2020-09-27 NOTE — Progress Notes (Signed)
Pt wants to put cpap on himself when he is ready to go to sleep.

## 2020-09-27 NOTE — Progress Notes (Addendum)
PROGRESS NOTE    Joshua [name]  KNN:[id number] DOB: [birth year] DOA: 09/24/2020 PCP: Elias Else, MD    Brief Narrative: 64/M with CAD/PCI, chronic diastolic CHF, chronic kidney disease stage IV, obstructive sleep apnea, hypertension, dyslipidemia and obesity was sent to the emergency room with abnormal labs noting worsening kidney function. -Patient reports increased fatigue decreased appetite and overall malaise for the last 2 weeks, followed by nephrology Dr. [name], on Lasix 160 mg 3 times a week alternating with 80 mg daily -In the ED labs noted creatinine of 5.5, potassium of 2.9 -Patient reported ongoing weight loss of about 40 pounds over the last 3 to 4 months  Assessment & Plan:    AKI on CKD stage 4 -Baseline creatinine is 2.5-3  -Admitted with creatinine of 5.5 likely secondary to overdiuresis, on Lasix160 mg 3 times a week alternating with 80 mg daily at baseline which was held on admission, hydrated with normal saline, creatinine slowly improving -Renal ultrasound without hydronephrosis -I suspect recent 40 pound weight loss over the last 3 to 4 months decreased his diuretic need -Appears euvolemic at this time, stop fluids after 4 more hours today -BMP in a.m. -Restart Lasix at 80 mg daily at discharge and follow-up with nephrology in 1 week with labs  T2DM uncontrolled/ dyslipidemia. -Continue Lantus, sliding scale -On ezetimibe for dyslipidemia.    HTN.  -Blood pressure low normal range continue to hold antihypertensives  Obesity class 3. BMI calculated is 41.6.   -Importance of ongoing weight loss emphasized  Elevated liver enzymes. Stable elevation of liver enzymes. AST 205 and ALT 207, liver US with no acute changes. -Could be from fatty liver disease -PCP to monitor this  CAD -Stable, continue with ticagrelor and aspirin,  Hypokalemia  -Due to diuretic therapy, improved  DVT prophylaxis: Heparin subcutaneous CODE STATUS: Full code Family  communication: No family at bedside, discussed with patient in detail  Status is: Inpatient  Remains inpatient appropriate because: Severity of illness  Dispo: The patient is from: Home              Anticipated d/c is to: Home              Anticipated d/c date is: 1 day              Patient currently is not medically stable to d/c.  Pending improvement in kidney function  Subjective: Feels okay, denies any complaints, denies any dyspnea  Objective: Vitals:   09/26/20 1820 09/26/20 2203 09/27/20 0635 09/27/20 1000  BP: 129/80 104/66 103/70 122/66  Pulse: 87 84 83 90  Resp: 18 18 16  (!) 22  Temp: 98.3 F (36.8 C) 98.1 F (36.7 C) 98.1 F (36.7 C) 99.3 F (37.4 C)  TempSrc:  Oral Oral Oral  SpO2: 100% 98% 98% 99%  Weight:      Height:        Intake/Output Summary (Last 24 hours) at 09/27/2020 1112 Last data filed at 09/27/2020 1000 Gross per 24 hour  Intake 3067.03 ml  Output 3700 ml  Net -632.97 ml   Filed Weights   09/25/20 1202 09/25/20 2055  Weight: 125.3 kg 126.5 kg    Examination:   General: Pleasant obese male sitting up in bed, AAOx3, no distress CVS: S1-S2, regular rate rhythm Lungs: Clear bilaterally Abdomen: Soft, nontender, bowel sounds present Extremities: No edema  Skin. No rashes on exposed Musculoskeletal: no joint deformities     Data Reviewed: I have personally reviewed following labs  and imaging studies  CBC: Recent Labs  Lab 09/24/20 1423 09/25/20 0157  WBC 9.3 8.2  NEUTROABS 6.2  --   HGB 12.5* 11.8*  HCT 38.6* 37.7*  MCV 88.5 88.5  PLT 193 294   Basic Metabolic Panel: Recent Labs  Lab 09/24/20 1423 09/24/20 2305 09/25/20 0157 09/26/20 0255 09/27/20 0553  NA 133*  --  134* 136 139  K 2.9*  --  3.0* 2.7* 3.6  CL 94*  --  96* 99 106  CO2 21*  --  $R'22 23 23  'Pb$ GLUCOSE 334*  --  328* 189* 205*  BUN 71*  --  75* 68* 55*  CREATININE 5.55* 5.73* 5.57* 4.98* 3.85*  CALCIUM 8.7*  --  8.5* 8.7* 8.9  MG  --  2.4  --  2.3  --      GFR: Estimated Creatinine Clearance: 26.2 mL/min (A) (by C-G formula based on SCr of 3.85 mg/dL (H)). Liver Function Tests: Recent Labs  Lab 09/24/20 1423 09/26/20 0255  AST 202* 205*  ALT 227* 207*  ALKPHOS 172* 141*  BILITOT 0.9 0.6  PROT 6.9 6.5  ALBUMIN 3.3* 3.1*   Recent Labs  Lab 09/24/20 2305  LIPASE 61*   No results for input(s): AMMONIA in the last 168 hours. Coagulation Profile: No results for input(s): INR, PROTIME in the last 168 hours. Cardiac Enzymes: No results for input(s): CKTOTAL, CKMB, CKMBINDEX, TROPONINI in the last 168 hours. BNP (last 3 results) No results for input(s): PROBNP in the last 8760 hours. HbA1C: No results for input(s): HGBA1C in the last 72 hours. CBG: Recent Labs  Lab 09/26/20 0645 09/26/20 1132 09/26/20 1639 09/26/20 2158 09/27/20 0633  GLUCAP 134* 194* 244* 238* 197*   Lipid Profile: No results for input(s): CHOL, HDL, LDLCALC, TRIG, CHOLHDL, LDLDIRECT in the last 72 hours. Thyroid Function Tests: No results for input(s): TSH, T4TOTAL, FREET4, T3FREE, THYROIDAB in the last 72 hours. Anemia Panel: No results for input(s): VITAMINB12, FOLATE, FERRITIN, TIBC, IRON, RETICCTPCT in the last 72 hours.  Scheduled Meds: . aspirin EC  81 mg Oral Daily  . ezetimibe  10 mg Oral Daily  . heparin  5,000 Units Subcutaneous Q8H  . insulin aspart  0-5 Units Subcutaneous QHS  . insulin aspart  0-9 Units Subcutaneous TID WC  . insulin glargine  15 Units Subcutaneous BID  . ticagrelor  90 mg Oral BID   Continuous Infusions: . sodium chloride 50 mL/hr at 09/27/20 1000     LOS: 3 days        Domenic Polite, MD

## 2020-09-28 DIAGNOSIS — N183 Chronic kidney disease, stage 3 unspecified: Secondary | ICD-10-CM | POA: Diagnosis not present

## 2020-09-28 DIAGNOSIS — E1142 Type 2 diabetes mellitus with diabetic polyneuropathy: Secondary | ICD-10-CM | POA: Diagnosis not present

## 2020-09-28 DIAGNOSIS — N179 Acute kidney failure, unspecified: Secondary | ICD-10-CM | POA: Diagnosis not present

## 2020-09-28 LAB — CBC
HCT: 36.9 % — ABNORMAL LOW (ref 39.0–52.0)
Hemoglobin: 11.8 g/dL — ABNORMAL LOW (ref 13.0–17.0)
MCH: 28.6 pg (ref 26.0–34.0)
MCHC: 32 g/dL (ref 30.0–36.0)
MCV: 89.3 fL (ref 80.0–100.0)
Platelets: 159 10*3/uL (ref 150–400)
RBC: 4.13 MIL/uL — ABNORMAL LOW (ref 4.22–5.81)
RDW: 14.7 % (ref 11.5–15.5)
WBC: 6.3 10*3/uL (ref 4.0–10.5)
nRBC: 0 % (ref 0.0–0.2)

## 2020-09-28 LAB — BASIC METABOLIC PANEL
Anion gap: 10 (ref 5–15)
BUN: 51 mg/dL — ABNORMAL HIGH (ref 8–23)
CO2: 22 mmol/L (ref 22–32)
Calcium: 9.1 mg/dL (ref 8.9–10.3)
Chloride: 104 mmol/L (ref 98–111)
Creatinine, Ser: 3.24 mg/dL — ABNORMAL HIGH (ref 0.61–1.24)
GFR, Estimated: 19 mL/min — ABNORMAL LOW (ref >60)
Glucose, Bld: 253 mg/dL — ABNORMAL HIGH (ref 70–99)
Potassium: 3.8 mmol/L (ref 3.5–5.1)
Sodium: 136 mmol/L (ref 135–145)

## 2020-09-28 LAB — GLUCOSE, CAPILLARY
Glucose-Capillary: 210 mg/dL — ABNORMAL HIGH (ref 70–99)
Glucose-Capillary: 234 mg/dL — ABNORMAL HIGH (ref 70–99)

## 2020-09-28 MED ORDER — METOPROLOL SUCCINATE ER 50 MG PO TB24
25.0000 mg | ORAL_TABLET | Freq: Every day | ORAL | 0 refills | Status: DC
Start: 2020-09-28 — End: 2021-02-23

## 2020-09-28 MED ORDER — NOVOLOG FLEXPEN 100 UNIT/ML ~~LOC~~ SOPN
5.0000 [IU] | PEN_INJECTOR | Freq: Three times a day (TID) | SUBCUTANEOUS | Status: DC
Start: 2020-09-28 — End: 2020-11-10

## 2020-09-28 MED ORDER — FUROSEMIDE 80 MG PO TABS: 80.0000 mg | ORAL_TABLET | Freq: Every day | ORAL | Status: DC

## 2020-09-28 MED ORDER — INSULIN GLARGINE 100 UNIT/ML ~~LOC~~ SOLN: 20.0000 [IU] | Freq: Two times a day (BID) | SUBCUTANEOUS | Status: DC

## 2020-09-28 MED ORDER — POTASSIUM CHLORIDE 20 MEQ PO PACK: 20.0000 meq | PACK | Freq: Every day | ORAL | Status: DC

## 2020-09-28 NOTE — TOC Transition Note (Signed)
Transition of Care [facility]) - CM/SW Discharge Note   Patient Details  Name: Joshua [name] MRN: [id number] Date of Birth: 07-08-1956  [facility]) CM/SW Contact:  Carles Collet, RN Phone Number: 09/28/2020, 9:43 AM   Clinical Narrative:    Damaris Schooner w patient on the phone. He would like 3/1, ordered through [employer] it will be delivered to the room prior to DC. Discussed OT PT, referral made to [facility] Added to AVS No other CM needs identified.     Final next level of care: Home/Self Care Barriers to Discharge: No Barriers Identified   Patient Goals and CMS Choice Patient states their goals for this hospitalization and ongoing recovery are:: to go home      Discharge Placement                       Discharge Plan and Services                DME Arranged: 3-N-1 DME Agency: [employer] Date DME Agency Contacted: 09/28/20 Time DME Agency Contacted: [phone] Representative spoke with at DME Agency: [facility]     Readmission Risk Interventions No flowsheet data found.

## 2020-09-28 NOTE — Discharge Summary (Signed)
Physician Discharge Summary  Joshua [name] ASN:[id number] DOB: [birth year] DOA: 09/24/2020  PCP: [name], MD  Admit date: 09/24/2020 Discharge date: 09/28/2020  Time spent: 35 minutes  Recommendations for Outpatient Follow-up:  1. Nephrology [name] in 1 week with BMP, message sent to office, titrate diuretic dose 2. PCP in 1 week, elevated AST/ALT, could be from statins or fatty liver disease, monitor    Discharge Diagnoses:  Principal Problem:   Acute renal failure superimposed on stage 4 chronic kidney disease ([place]) Active Problems:   Morbid obesity due to excess calories ([facility])   Diabetic polyneuropathy associated with type 2 diabetes mellitus ([place])   Essential hypertension   Type 2 diabetes mellitus with stage 3 chronic kidney disease ([place])   Hypokalemia   Discharge Condition: Stable  Diet recommendation: Low-sodium, diabetic, heart healthy  Filed Weights   09/25/20 1202 09/25/20 2055  Weight: 125.3 kg 126.5 kg    History of present illness:  64/M with CAD/PCI, chronic diastolic CHF, chronic kidney disease stage IV, obstructive sleep apnea, hypertension, dyslipidemia and obesity was sent to the emergency room with abnormal labs noting worsening kidney function. -Patient reports increased fatigue decreased appetite and overall malaise for the last 2 weeks, followed by nephrology [name], on Lasix 160 mg 3 times a week alternating with 80 mg daily -In the ED labs noted creatinine of 5.5, potassium of 2.9 -Patient reported ongoing weight loss of about 40 pounds over the last 3 to 4 months  Hospital Course:   AKI on CKD stage 4 -Baseline creatinine is 2.5-3  -Admitted with creatinine of 5.5 likely secondary to overdiuresis, on Lasix160 mg 3 times a week alternating with 80 mg daily at baseline which was held on admission, hydrated with normal saline, creatinine slowly improving -Renal ultrasound without hydronephrosis -I suspect recent 40 pound weight loss  over the last 3 to 4 months decreased his diuretic need -Clinically improved after hydration, IV fluids discontinued yesterday, creatinine down to 3.2 from 5.5 on admission -Discussed with on-call nephrology today, will discharge home on 80 mg daily of Lasix to start from tomorrow and have labs done this coming week through Dr. Bishop [place] office, importance of follow-up labs emphasized  T2DM uncontrolled/ dyslipidemia. -Noted to be more hypoglycemic this admission, insulin dose decreased in the setting of worsening kidney function -On ezetimibe for dyslipidemia.    HTN.  -Systolic blood pressure in the 100-120 range throughout this admission -Hydralazine and Imdur discontinued -Toprol resumed at low-dose at discharge given known CAD  Obesity class 3. BMI calculated is 41.6.   -Importance of ongoing weight loss emphasized  Elevated liver enzymes. Stable elevation of liver enzymes. AST 205 and ALT 207, liver US with no acute changes. -Could be from fatty liver disease and/or statins -PCP to monitor this  CAD -Stable, continue with ticagrelor, Toprol and aspirin, -Toprol dose decreased  Hypokalemia  -Due to diuretic therapy, improved, replaced   Discharge Exam: Vitals:   09/27/20 2148 09/28/20 0933  BP: (!) 127/56 (!) 155/114  Pulse: 79 79  Resp: 18 18  Temp: 98 F (36.7 C) 98.3 F (36.8 C)  SpO2: 99% 96%    General: AAOx3, obese pleasant male Cardiovascular: S1-S2, regular rate rhythm Respiratory: Clear Discharge Instructions   Discharge Instructions    Ambulatory referral to Physical Therapy   Complete by: As directed    increased fatigue decreased appetite and overall malaise for the last 2 weeks BMI 41 The pt presents with limitations in functional mobility, activity  tolerance, and dynamic stability due to above dx, and will continue to benefit from skilled PT to address these deficits.   Diet - low sodium heart healthy   Complete by: As directed    Diet  Carb Modified   Complete by: As directed    Increase activity slowly   Complete by: As directed      Allergies as of 09/28/2020      Reactions   Chlorthalidone Other (See Comments)   Causes dehydration, kidneys shut down      Medication List    STOP taking these medications   hydrALAZINE 100 MG tablet Commonly known as: APRESOLINE   isosorbide mononitrate 120 MG 24 hr tablet Commonly known as: IMDUR     TAKE these medications   Accu-Chek FastClix Lancets Misc 1 each by Other route in the morning, at noon, in the evening, and at bedtime.   Accu-Chek Guide test strip Generic drug: glucose blood USE UP TO FOUR TIMES DAILY AS DIRECTED What changed:   how much to take  how to take this  when to take this   acetaminophen 650 MG CR tablet Commonly known as: TYLENOL Take 1,300 mg by mouth daily as needed for pain.   allopurinol 300 MG tablet Commonly known as: ZYLOPRIM Take 300 mg by mouth daily.   amLODipine 10 MG tablet Commonly known as: NORVASC Take 1 tablet (10 mg total) by mouth daily.   aspirin EC 81 MG tablet Take 81 mg by mouth daily.   blood glucose meter kit and supplies Dispense based on patient and insurance preference. Use up to four times daily as directed. (FOR ICD-10 E10.9, E11.9). What changed:   how much to take  how to take this  when to take this   ezetimibe 10 MG tablet Commonly known as: ZETIA Take 1 tablet (10 mg total) by mouth daily.   furosemide 80 MG tablet Commonly known as: LASIX Take 1 tablet (80 mg total) by mouth daily. Start taking on: September 30, 2020 What changed:   how much to take  when to take this  additional instructions  These instructions start on September 30, 2020. If you are unsure what to do until then, ask your doctor or other care provider.   insulin glargine 100 UNIT/ML injection Commonly known as: LANTUS Inject 0.2 mLs (20 Units total) into the skin 2 (two) times daily. What changed: how much  to take   metoprolol succinate 50 MG 24 hr tablet Commonly known as: TOPROL-XL Take 0.5 tablets (25 mg total) by mouth daily. Take with or immediately following a meal. What changed: how much to take   nitroGLYCERIN 0.4 MG SL tablet Commonly known as: NITROSTAT Place 1 tablet (0.4 mg total) under the tongue every 5 (five) minutes as needed for chest pain.   NovoLOG FlexPen 100 UNIT/ML FlexPen Generic drug: insulin aspart Inject 5-8 Units into the skin 3 (three) times daily with meals. What changed: how much to take   pantoprazole 40 MG tablet Commonly known as: PROTONIX TAKE 1 TABLET BY MOUTH TWICE DAILY BEFORE MEAL(S) What changed: See the new instructions.   potassium chloride 20 MEQ packet Commonly known as: KLOR-CON Take 20 mEq by mouth daily. What changed: when to take this   rosuvastatin 40 MG tablet Commonly known as: CRESTOR Take 1 tablet by mouth once daily   ticagrelor 90 MG Tabs tablet Commonly known as: Brilinta Take 1 tablet (90 mg total) by mouth 2 (two) times daily.  Victoza 18 MG/3ML Sopn Generic drug: liraglutide INJECT 0.3 MLS (1.8MG TOTAL) INTO THE SKIN DAILY What changed: See the new instructions.   Vitamin D (Ergocalciferol) 1.25 MG (50000 UNIT) Caps capsule Commonly known as: DRISDOL Take 50,000 Units by mouth once a week.            Durable Medical Equipment  (From admission, onward)         Start     Ordered   09/28/20 0923  For home use only DME 3 n 1  Once        09/28/20 0923         Allergies  Allergen Reactions  . Chlorthalidone Other (See Comments)    Causes dehydration, kidneys shut down    Follow-up Information    Madelon Lips, MD. Go in 1 week(s).   Specialty: Nephrology Why: for labs and then follow up Contact information: [place] [address] [phone]        [facility] Follow up.   Specialty: Rehabilitation Why: you should recieve a call to schedule an  appointentment this week. Contact information: [address] [phone] mc [place], [place] [facility] Follow up.   Why: 3/1 to be delivered to room prior to DC Contact information: [address] [place] [address] [phone]                The results of significant diagnostics from this hospitalization (including imaging, microbiology, ancillary and laboratory) are listed below for reference.    Significant Diagnostic Studies: US Abdomen Complete  Result Date: 09/24/2020 CLINICAL DATA:  Acute renal injury and elevated LFTs EXAM: ABDOMEN ULTRASOUND COMPLETE COMPARISON:  None. FINDINGS: Gallbladder: No gallstones or wall thickening visualized. No sonographic Murphy sign noted by sonographer. Common bile duct: Diameter: 5 mm. Liver: No focal lesion identified. Within normal limits in parenchymal echogenicity. Portal vein is patent on color Doppler imaging with normal direction of blood flow towards the liver. IVC: No abnormality visualized. Pancreas: Visualized portion unremarkable. Spleen: Size and appearance within normal limits. Right Kidney: Length: 9.9 cm. Echogenicity within normal limits. No mass or hydronephrosis visualized. Left Kidney: Length: 9.0 cm. Echogenicity within normal limits. No mass or hydronephrosis visualized. Abdominal aorta: No aneurysm visualized. Other findings: None. IMPRESSION: No acute abnormality noted. Electronically Signed   By: Inez Catalina M.D.   On: 09/24/2020 22:28    Microbiology: Recent Results (from the past 240 hour(s))  Respiratory Panel by RT PCR (Flu A&B, Covid) - Nasopharyngeal Swab     Status: None   Collection Time: 09/25/20  1:57 AM   Specimen: Nasopharyngeal Swab  Result Value Ref Range Status   SARS Coronavirus 2 by RT PCR NEGATIVE NEGATIVE Final    Comment: (NOTE) SARS-CoV-2 target nucleic acids are NOT DETECTED.  The SARS-CoV-2 RNA is generally  detectable in upper respiratoy specimens during the acute phase of infection. The lowest concentration of SARS-CoV-2 viral copies this assay can detect is 131 copies/mL. A negative result does not preclude SARS-Cov-2 infection and should not be used as the sole basis for treatment or other patient management decisions. A negative result may occur with  improper specimen collection/handling, submission of specimen other than nasopharyngeal swab, presence of viral mutation(s) within the areas targeted by this assay, and inadequate number of viral copies (<131 copies/mL). A negative result must be combined with clinical observations, patient history, and epidemiological information. The expected  result is Negative.  Fact Sheet for Patients:  PinkCheek.be  Fact Sheet for Healthcare Providers:  GravelBags.it  This test is no t yet approved or cleared by the [place] FDA and  has been authorized for detection and/or diagnosis of SARS-CoV-2 by FDA under an Emergency Use Authorization (EUA). This EUA will remain  in effect (meaning this test can be used) for the duration of the COVID-19 declaration under Section 564(b)(1) of the Act, 21 U.S.C. section 360bbb-3(b)(1), unless the authorization is terminated or revoked sooner.     Influenza A by PCR NEGATIVE NEGATIVE Final   Influenza B by PCR NEGATIVE NEGATIVE Final    Comment: (NOTE) The Xpert Xpress SARS-CoV-2/FLU/RSV assay is intended as an aid in  the diagnosis of influenza from Nasopharyngeal swab specimens and  should not be used as a sole basis for treatment. Nasal washings and  aspirates are unacceptable for Xpert Xpress SARS-CoV-2/FLU/RSV  testing.  Fact Sheet for Patients: PinkCheek.be  Fact Sheet for Healthcare Providers: GravelBags.it  This test is not yet approved or cleared by the [place] FDA and   has been authorized for detection and/or diagnosis of SARS-CoV-2 by  FDA under an Emergency Use Authorization (EUA). This EUA will remain  in effect (meaning this test can be used) for the duration of the  Covid-19 declaration under Section 564(b)(1) of the Act, 21  U.S.C. section 360bbb-3(b)(1), unless the authorization is  terminated or revoked. Performed at [facility], [place] [address] [place], [place] [address]      Labs: Basic Metabolic Panel: Recent Labs  Lab 09/24/20 1423 09/24/20 1423 09/24/20 2305 09/25/20 0157 09/26/20 0255 09/27/20 0553 09/28/20 0320  NA 133*  --   --  134* 136 139 136  K 2.9*  --   --  3.0* 2.7* 3.6 3.8  CL 94*  --   --  96* 99 106 104  CO2 21*  --   --  _0 GLUCOSE 334*  --   --  328* 189* 205* 253*  BUN 71*  --   --  75* 68* 55* 51*  CREATININE 5.55*   < > 5.73* 5.57* 4.98* 3.85* 3.24*  CALCIUM 8.7*  --   --  8.5* 8.7* 8.9 9.1  MG  --   --  2.4  --  2.3  --   --    < > = values in this interval not displayed.   Liver Function Tests: Recent Labs  Lab 09/24/20 1423 09/26/20 0255  AST 202* 205*  ALT 227* 207*  ALKPHOS 172* 141*  BILITOT 0.9 0.6  PROT 6.9 6.5  ALBUMIN 3.3* 3.1*   Recent Labs  Lab 09/24/20 2305  LIPASE 61*   No results for input(s): AMMONIA in the last 168 hours. CBC: Recent Labs  Lab 09/24/20 1423 09/25/20 0157 09/28/20 0320  WBC 9.3 8.2 6.3  NEUTROABS 6.2  --   --   HGB 12.5* 11.8* 11.8*  HCT 38.6* 37.7* 36.9*  MCV 88.5 88.5 89.3  PLT 193 167 159   Cardiac Enzymes: No results for input(s): CKTOTAL, CKMB, CKMBINDEX, TROPONINI in the last 168 hours. BNP: BNP (last 3 results) No results for input(s): BNP in the last 8760 hours.  ProBNP (last 3 results) No results for input(s): PROBNP in the last 8760 hours.  CBG: Recent Labs  Lab 09/27/20 1122 09/27/20 1637 09/27/20 2155 09/28/20 0756 09/28/20 1137  GLUCAP 269* 361* 372* 210* 234*  Signed:  Domenic Polite MD.   [facility] 09/28/2020, 12:34 PM

## 2020-09-28 NOTE — Progress Notes (Signed)
DISCHARGE NOTE HOME AHIJAH DEVERY to be discharge to home per MD order. Discussed prescriptions and follow up appointments with the patient. Prescriptions given to patient; medication list explained in detail. Patient verbalized understanding.  Skin clean, dry and intact without evidence of skin break down, no evidence of skin tears noted. IV catheter discontinued intact. Site without signs and symptoms of complications. Dressing and pressure applied. Pt denies pain at the site currently. No complaints noted.  Patient free of lines, drains, and wounds.   An After Visit Summary (AVS) was printed and given to the patient. Patient escorted via wheelchair, and discharged home via private auto.  [place], Zenon Mayo, RN

## 2020-10-13 ENCOUNTER — Ambulatory Visit: Payer: Medicare Other | Attending: Internal Medicine | Admitting: Physical Therapy

## 2020-10-13 ENCOUNTER — Encounter: Payer: Self-pay | Admitting: Physical Therapy

## 2020-10-13 ENCOUNTER — Other Ambulatory Visit: Payer: Self-pay

## 2020-10-13 DIAGNOSIS — R600 Localized edema: Secondary | ICD-10-CM | POA: Diagnosis present

## 2020-10-13 DIAGNOSIS — R262 Difficulty in walking, not elsewhere classified: Secondary | ICD-10-CM | POA: Diagnosis present

## 2020-10-13 DIAGNOSIS — M6281 Muscle weakness (generalized): Secondary | ICD-10-CM | POA: Diagnosis present

## 2020-10-13 DIAGNOSIS — R296 Repeated falls: Secondary | ICD-10-CM | POA: Diagnosis present

## 2020-10-14 ENCOUNTER — Encounter: Payer: Self-pay | Admitting: Physical Therapy

## 2020-10-14 NOTE — Therapy (Signed)
[place], [place], [address] Phone: [phone]   Fax:  [phone]  Physical Therapy Evaluation  Patient Details  Name: Joshua [name] MRN: [id number] Date of Birth: 27-Jul-1956 Referring Provider (PT): Preethat Jospeh   Encounter Date: 10/13/2020   PT End of Session - 10/13/20 1517    Visit Number 1    Number of Visits 12    Date for PT Re-Evaluation 11/24/20    Authorization Type Medicare/ medicaid    PT Start Time 1504    PT Stop Time 1550    PT Time Calculation (min) 46 min    Activity Tolerance Patient tolerated treatment well    Behavior During Therapy [facility] for tasks assessed/performed           Past Medical History:  Diagnosis Date   Anemia    Chronic combined systolic and diastolic CHF (congestive heart failure) ([place])    a. 07/2015 Echo: EF 45-50%, mod LVH, mid apical/antsept AK, Gr 1 DD.   CKD (chronic kidney disease), stage III ([place])    Coronary artery disease    a. 2012 s/p MI/PCI: s/p DES to mid/dist RCA and overlapping DES to LAD;  b. 07/2015 Cath: LAD 10% ISR, D1 40(jailed), LCX 30m, OM2 30, OM3 30, RCA 39m ISR, 10d ISR; c. 07/2016 NSTEMI/PCI: LAD 85ost/p/m (3.5x20 Synergy DES overlapping prior stent), D1 40, LCX 62m, OM2 95 (2.75x16 Synergy DES), OM3 30, RCA 66m, 10d.   Depression    Diabetic gastroparesis ([place])    DKA (diabetic ketoacidoses) 03/14/2018   GERD (gastroesophageal reflux disease)    Gout    Heart murmur    Hyperlipemia    Hypertension    Hypertensive heart disease    Ischemic cardiomyopathy    a. 07/2015 Echo: EF 45-50%, mod LVH, mid-apicalanteroseptal AK, Gr1 DD.   Morbid obesity ([place])    OSA on CPAP    Osteomyelitis ([place])    a. 07/2016 MTP joint of R great toe s/p transmetatarsal amputation.   Polyneuropathy in diabetes(357.2)    Pulmonary sarcoidosis ([place])    "problems w/it years ago; no problems anymore" (07/03/2014)   Rectal bleeding     Sleep apnea    Type II diabetes mellitus ([place])     Past Surgical History:  Procedure Laterality Date   AMPUTATION Right 07/24/2016   Procedure: TRANSMETATARSAL FOOT AMPUTATION;  Surgeon: [name], MD;  Location: [place];  Service: Orthopedics;  Laterality: Right;   BALLOON DILATION N/A 03/21/2018   Procedure: BALLOON DILATION;  Surgeon: [name], MD;  Location: [facility] ENDOSCOPY;  Service: Endoscopy;  Laterality: N/A;   BREAST LUMPECTOMY Right ~ 2000   "benign"   CARDIAC CATHETERIZATION N/A 07/30/2015   Procedure: Left Heart Cath and Coronary Angiography;  Surgeon: Burnell Blanks, MD;  Location: [place] CV [facility];  Service: Cardiovascular;  Laterality: N/A;   CARDIAC CATHETERIZATION N/A 07/19/2016   Procedure: Left Heart Cath and Coronary Angiography;  Surgeon: Belva Crome, MD;  Location: [place] [facility];  Service: Cardiovascular;  Laterality: N/A;   CARDIAC CATHETERIZATION N/A 07/29/2016   Procedure: Coronary Stent Intervention;  Surgeon: Belva Crome, MD;  Location: [place] [facility];  Service: Cardiovascular;  Laterality: N/A;   CARDIAC CATHETERIZATION N/A 07/29/2016   Procedure: Intravascular Ultrasound/IVUS;  Surgeon: Belva Crome, MD;  Location: [place] [facility];  Service: Cardiovascular;  Laterality: N/A;   CATARACT EXTRACTION W/ INTRAOCULAR LENS  IMPLANT, BILATERAL Bilateral 2014-2015   COLONOSCOPY  WITH PROPOFOL N/A 08/22/2017   Procedure: COLONOSCOPY WITH PROPOFOL;  Surgeon: [name], MD;  Location: [place];  Service: Endoscopy;  Laterality: N/A;   CORONARY ANGIOPLASTY WITH STENT PLACEMENT  10/31/2011   30 % MID ATRIOVENTRICULAR GROOVE CX STENOSIS. 90 % MID RCA.PTA TO LAD/STENTING OF TANDEM 60-70%. LAD STENOSIS 99% REDUCED TO 0%.3.0 X 32MM PROMUS DES POSTDILATED TO 3.25MM.   CORONARY ANGIOPLASTY WITH STENT PLACEMENT  07/03/2014   "2"   CORONARY STENT INTERVENTION N/A 04/09/2019   Procedure: CORONARY STENT INTERVENTION;  Surgeon: Jettie Booze, MD;  Location: [place] [facility];  Service: Cardiovascular;  Laterality: N/A;   CORONARY STENT INTERVENTION N/A 08/22/2019   Procedure: CORONARY STENT INTERVENTION;  Surgeon: Martinique, Peter M, MD;  Location: [place] [facility];  Service: Cardiovascular;  Laterality: N/A;   ESOPHAGOGASTRODUODENOSCOPY (EGD) WITH PROPOFOL N/A 03/21/2018   Procedure: ESOPHAGOGASTRODUODENOSCOPY (EGD) WITH PROPOFOL;  Surgeon: [name], MD;  Location: [place];  Service: Endoscopy;  Laterality: N/A;   I & D EXTREMITY Right 10/30/2013   Procedure: IRRIGATION AND DEBRIDEMENT RIGHT SMALL FINGER POSSIBLE SECONDARY WOUND CLOSURE;  Surgeon: [name], MD;  Location: [facility];  Service: Orthopedics;  Laterality: Right;  [name] is available after 4pm   I & D EXTREMITY Right 11/01/2013   Procedure:  IRRIGATION AND DEBRIDEMENT RIGHT  PROXIMAL PHALANGEAL LEVEL AMPUTATION;  Surgeon: [name], MD;  Location: [facility];  Service: Orthopedics;  Laterality: Right;   INCISION AND DRAINAGE Right 10/28/2013   Procedure: INCISION AND DRAINAGE Right small finger;  Surgeon: [name], MD;  Location: [facility];  Service: Orthopedics;  Laterality: Right;   LEFT HEART CATH Left 11/03/2011   Procedure: LEFT HEART CATH;  Surgeon: [name], MD;  Location: [facility] CATH LAB;  Service: Cardiovascular;  Laterality: Left;   LEFT HEART CATH AND CORONARY ANGIOGRAPHY N/A 04/09/2019   Procedure: LEFT HEART CATH AND CORONARY ANGIOGRAPHY;  Surgeon: Jettie Booze, MD;  Location: [place] [facility];  Service: Cardiovascular;  Laterality: N/A;   LEFT HEART CATHETERIZATION WITH CORONARY ANGIOGRAM N/A 07/03/2014   Procedure: LEFT HEART CATHETERIZATION WITH CORONARY ANGIOGRAM;  Surgeon: [name], MD;  Location: [facility] CATH LAB;  Service: Cardiovascular;  Laterality: N/A;   PERCUTANEOUS CORONARY STENT INTERVENTION (PCI-S) Left 11/03/2011   Procedure: PERCUTANEOUS CORONARY STENT INTERVENTION (PCI-S);   Surgeon: [name], MD;  Location: [facility] CATH LAB;  Service: Cardiovascular;  Laterality: Left;   RIGHT/LEFT HEART CATH AND CORONARY ANGIOGRAPHY N/A 08/22/2019   Procedure: RIGHT/LEFT HEART CATH AND CORONARY ANGIOGRAPHY;  Surgeon: Martinique, Peter M, MD;  Location: [place] [facility];  Service: Cardiovascular;  Laterality: N/A;   TOE AMPUTATION Right ~ 2010   "2nd toe"   TOE AMPUTATION Right 2012   "3rd toe"    There were no vitals filed for this visit.    Subjective Assessment - 10/13/20 1514    Subjective Patient has a history of falls stemming form neuropthy and a right foot amptuation. He recently was admitted to the hospital for CKD. Since that point he feels like he is falling more often and is having some deconditioning. He has had 2 falls in the past 4 weeks. He is not currently in any pain but does have OA in his right knee.    Pertinent History anemia, CAD, CKD, DKA,    How long can you sit comfortably? no limit    How long can you stand comfortably? fatigues after time  How long can you walk comfortably? can walk with device    Currently in Pain? No/denies   a little soreness in the am after his booster shot             [facility] PT Assessment - 10/14/20 0001      Assessment   Medical Diagnosis frequent falls/ deconditioning     Referring Provider (PT) Preethat Jospeh    Onset Date/Surgical Date --   has had probelmes for some time but worse sinse admission    Hand Dominance Right    Next MD Visit Nothing scheduled    Prior Therapy None      Precautions   Precautions Fall      Restrictions   Weight Bearing Restrictions No      Balance Screen   Has the patient fallen in the past 6 months Yes    How many times? 2    Has the patient had a decrease in activity level because of a fear of falling?  Yes    Is the patient reluctant to leave their home because of a fear of falling?  Yes      Home Environment   Additional Comments No steps into the house       Prior  Function   Level of Independence Independent    Vocation On disability    Leisure acting,       Cognition   Overall Cognitive Status Within Functional Limits for tasks assessed    Attention Focused    Focused Attention Appears intact    Memory Appears intact    Awareness Appears intact    Problem Solving Appears intact      Observation/Other Assessments   Focus on Therapeutic Outcomes (FOTO)  44% limitation       Sensation   Light Touch Appears Intact    Additional Comments significant numbness in bilateral LE L > R       Coordination   Gross Motor Movements are Fluid and Coordinated Yes    Fine Motor Movements are Fluid and Coordinated Yes      AROM   Overall AROM Comments full active UE ROM       Strength   Right/Left Hip Left;Right    Right Hip Flexion 4/5    Right Hip ABduction 4/5    Right Hip ADduction 4/5    Left Hip ABduction 4/5    Left Hip ADduction 4/5    Right/Left Knee Right;Left    Right Knee Flexion 4+/5    Right Knee Extension 4+/5    Left Knee Flexion 4+/5    Left Knee Extension 4+/5      Palpation   Palpation comment No tenderness to palpation       Transfers   Comments uses hands to push up. 5x sit to stand       Ambulation/Gait   Gait velocity 5 m 9,9, 7 sec    Gait Comments decreased right single leg stance; decreased stride length, narroew base of support;       High Level Balance   High Level Balance Comments tandem stance min a bilateral, narrow base eyes open CGA euyes closed mod a                       Objective measurements completed on examination: See above findings.       [facility] Adult PT Treatment/Exercise - 10/14/20 0001      Exercises   Exercises  Knee/Hip      Knee/Hip Exercises: Seated   Long Arc Quad Limitations LAQ 2x10     Clamshell with TheraBand Red   2x10    Hamstring Limitations x10 red                     PT Short Term Goals - 10/14/20 1510      PT SHORT TERM GOAL #1   Title  Patient will increase bilateral LE strength to 5/5    Time 3    Period Weeks    Status New    Target Date 11/04/20      PT SHORT TERM GOAL #2   Title Patient will require close gaurd with tandem stance    Time 3    Period Weeks    Status New    Target Date 06/09/20      PT SHORT TERM GOAL #3   Title Patient will complete BERG balance scale and make an appropriate goal    Time 3    Period Weeks    Status New    Target Date 11/04/20             PT Long Term Goals - 10/14/20 1513      PT LONG TERM GOAL #1   Title Patient will decrease 5x sit to stand transfer time to 10 seconds to demonstrat improved ability to transfer    Time 6    Period Weeks    Status New    Target Date 11/25/20      PT LONG TERM GOAL #2   Title Patient will increase average 41m walking speed to less then 5 seconds to improve communty ambulation    Time 6    Period Weeks    Status New    Target Date 11/25/20      PT LONG TERM GOAL #3   Title Patient will report no falls for a 3 weeks period    Time 6    Period Weeks    Status New    Target Date 11/25/20      PT LONG TERM GOAL #4   Title Patient will safely ambualte 3000' with single point cane    Time 6    Period Weeks    Status New    Target Date 11/25/20                  Plan - 10/13/20 1634    Clinical Impression Statement Patient is a 64 year old male with deconidtioning and decreased balance. he has had these problems for some time but his symptoms were exacerbated by a recent hospitilization for and AKI. He had a decreased sit to stand test time compatred to age related norms. He has a slow gait speed.[name] He uses a cane for ambualtion. He has his right forefoot amputated with a partial orthotic in the shoe. He trips on the right side at times. He has a significant deficit in eyes closed balance.    Personal Factors and Comorbidities Comorbidity 1;Comorbidity 2;Comorbidity 3+;Fitness    Comorbidities peripheral neuropathy, Stage  III CKD, depression, caridaic issues, right great tow ambutation and 2nd toe    Examination-Activity Limitations Bend;Carry;Dressing;Stairs;Squat;Locomotion Level    Examination-Participation Restrictions Laundry;Community Activity    Stability/Clinical Decision Making Evolving/Moderate complexity    Clinical Decision Making Moderate    Rehab Potential Good    PT Frequency 2x / week    PT Duration 6 weeks    PT  Treatment/Interventions ADLs/Self Care Home Management;Electrical Stimulation;Cryotherapy;Iontophoresis 4mg /ml Dexamethasone;Ultrasound;DME Instruction;Gait training;Stair training;Functional mobility training;Therapeutic activities;Therapeutic exercise;Neuromuscular re-education;Patient/family education;Manual techniques;Passive range of motion;Dry needling;Taping    PT Next Visit Plan BERG balance tes, begin balance training, review general UE strengthening for HEP; add nu-step for endurance training    PT Home Exercise Plan LAQ, hamstring curl, clamshell    Consulted and Agree with Plan of Care Patient           Patient will benefit from skilled therapeutic intervention in order to improve the following deficits and impairments:  Cardiopulmonary status limiting activity, Pain, Decreased strength, Decreased mobility, Decreased balance, Decreased knowledge of precautions, Impaired flexibility  Visit Diagnosis: Repeated falls  Difficulty in walking, not elsewhere classified  Muscle weakness (generalized)     Problem List Patient Active Problem List   Diagnosis Date Noted   Hypokalemia 09/25/2020   Type 2 diabetes mellitus with stage 3 chronic kidney disease ([place]) 09/24/2020   Unstable angina ([place]) 08/17/2019   Chest pain 08/16/2019   NSTEMI (non-ST elevated myocardial infarction) ([place]) 04/06/2019   ACS (acute coronary syndrome) ([place]) 04/06/2019   Sepsis ([place]) 03/15/2018   DKA (diabetic ketoacidoses) 03/15/2018   Special screening for malignant neoplasms, colon  08/22/2017   Acute renal failure superimposed on stage 3 chronic kidney disease ([place]) 05/19/2017   Essential hypertension    Status post transmetatarsal amputation of foot, right ([place]) 11/08/2016   Diabetic polyneuropathy associated with type 2 diabetes mellitus ([place]) 11/08/2016   Pure hypercholesterolemia    AKI (acute kidney injury) ([place])    Acute blood loss anemia    DM type 2 with diabetic peripheral neuropathy ([place])    Physical debility 08/03/2016   Chronic osteomyelitis of right foot ([place])    Cellulitis and abscess of leg, except foot    Penetrating wound of right foot    CKD (chronic kidney disease) stage 3, GFR 30-59 ml/min ([facility])    Unstable angina pectoris ([place]) 07/18/2016   Chronic combined systolic and diastolic congestive heart failure ([place])    Chest pain with moderate risk for cardiac etiology 06/24/2014   CAD S/P multiple PCIs 06/24/2014   Sleep apnea- on C-pap 06/24/2014   Morbid obesity due to excess calories ([place]) 11/10/2013   Hyperglycemia 10/29/2013   Hyperlipidemia 01/12/2011   Benign essential HTN 01/12/2011   GERD 01/12/2011    Carney Living PT DPT  10/14/2020, 3:40 PM  [facility] [address] [place], [place], [address] Phone: [phone]   Fax:  [phone]  Name: MAISEN KLINGLER MRN: [id number] Date of Birth: Apr 12, 1956

## 2020-10-20 ENCOUNTER — Ambulatory Visit: Payer: Medicare Other | Admitting: Physical Therapy

## 2020-10-20 ENCOUNTER — Encounter: Payer: Self-pay | Admitting: Physical Therapy

## 2020-10-20 ENCOUNTER — Other Ambulatory Visit: Payer: Self-pay

## 2020-10-20 DIAGNOSIS — R296 Repeated falls: Secondary | ICD-10-CM | POA: Diagnosis not present

## 2020-10-20 DIAGNOSIS — M6281 Muscle weakness (generalized): Secondary | ICD-10-CM

## 2020-10-20 DIAGNOSIS — R600 Localized edema: Secondary | ICD-10-CM

## 2020-10-20 DIAGNOSIS — R262 Difficulty in walking, not elsewhere classified: Secondary | ICD-10-CM

## 2020-10-20 NOTE — Therapy (Signed)
[place], [place], [address] Phone: [phone]   Fax:  [phone]  Physical Therapy Treatment  Patient Details  Name: WLLIAM GROSSO MRN: [id number] Date of Birth: 1956-06-13 Referring Provider (PT): Preethat Jospeh   Encounter Date: 10/20/2020   PT End of Session - 10/20/20 1417    Visit Number 2    Number of Visits 12    Date for PT Re-Evaluation 11/24/20    Authorization Type Medicare/ medicaid    PT Start Time 1417    PT Stop Time 1457    PT Time Calculation (min) 40 min    Activity Tolerance Patient tolerated treatment well    Behavior During Therapy [facility] for tasks assessed/performed           Past Medical History:  Diagnosis Date  . Anemia   . Chronic combined systolic and diastolic CHF (congestive heart failure) ([place])    a. 07/2015 Echo: EF 45-50%, mod LVH, mid apical/antsept AK, Gr 1 DD.  [name] CKD (chronic kidney disease), stage III ([place])   . Coronary artery disease    a. 2012 s/p MI/PCI: s/p DES to mid/dist RCA and overlapping DES to LAD;  b. 07/2015 Cath: LAD 10% ISR, D1 40(jailed), LCX 54m, OM2 30, OM3 30, RCA 55m ISR, 10d ISR; c. 07/2016 NSTEMI/PCI: LAD 85ost/p/m (3.5x20 Synergy DES overlapping prior stent), D1 40, LCX 80m, OM2 95 (2.75x16 Synergy DES), OM3 30, RCA 33m, 10d.  . Depression   . Diabetic gastroparesis ([place])   . DKA (diabetic ketoacidoses) 03/14/2018  . GERD (gastroesophageal reflux disease)   . Gout   . Heart murmur   . Hyperlipemia   . Hypertension   . Hypertensive heart disease   . Ischemic cardiomyopathy    a. 07/2015 Echo: EF 45-50%, mod LVH, mid-apicalanteroseptal AK, Gr1 DD.  . Morbid obesity ([place])   . OSA on CPAP   . Osteomyelitis ([place])    a. 07/2016 MTP joint of R great toe s/p transmetatarsal amputation.  . Polyneuropathy in diabetes(357.2)   . Pulmonary sarcoidosis ([place])    "problems w/it years ago; no problems anymore" (07/03/2014)  . Rectal bleeding   .  Sleep apnea   . Type II diabetes mellitus ([place])     Past Surgical History:  Procedure Laterality Date  . AMPUTATION Right 07/24/2016   Procedure: TRANSMETATARSAL FOOT AMPUTATION;  Surgeon: [name], MD;  Location: [place];  Service: Orthopedics;  Laterality: Right;  . BALLOON DILATION N/A 03/21/2018   Procedure: BALLOON DILATION;  Surgeon: [name], MD;  Location: [facility] ENDOSCOPY;  Service: Endoscopy;  Laterality: N/A;  . BREAST LUMPECTOMY Right ~ 2000   "benign"  . CARDIAC CATHETERIZATION N/A 07/30/2015   Procedure: Left Heart Cath and Coronary Angiography;  Surgeon: Burnell Blanks, MD;  Location: [place] CV LAB;  Service: Cardiovascular;  Laterality: N/A;  . CARDIAC CATHETERIZATION N/A 07/19/2016   Procedure: Left Heart Cath and Coronary Angiography;  Surgeon: Belva Crome, MD;  Location: [place] CV LAB;  Service: Cardiovascular;  Laterality: N/A;  . CARDIAC CATHETERIZATION N/A 07/29/2016   Procedure: Coronary Stent Intervention;  Surgeon: Belva Crome, MD;  Location: [place] CV LAB;  Service: Cardiovascular;  Laterality: N/A;  . CARDIAC CATHETERIZATION N/A 07/29/2016   Procedure: Intravascular Ultrasound/IVUS;  Surgeon: Belva Crome, MD;  Location: [place] [facility];  Service: Cardiovascular;  Laterality: N/A;  . CATARACT EXTRACTION W/ INTRAOCULAR LENS  IMPLANT, BILATERAL Bilateral 2014-2015  . COLONOSCOPY  WITH PROPOFOL N/A 08/22/2017   Procedure: COLONOSCOPY WITH PROPOFOL;  Surgeon: [name], MD;  Location: [place];  Service: Endoscopy;  Laterality: N/A;  . CORONARY ANGIOPLASTY WITH STENT PLACEMENT  10/31/2011   30 % MID ATRIOVENTRICULAR GROOVE CX STENOSIS. 90 % MID RCA.PTA TO LAD/STENTING OF TANDEM 60-70%. LAD STENOSIS 99% REDUCED TO 0%.3.0 X 32MM PROMUS DES POSTDILATED TO 3.25MM.  [name] CORONARY ANGIOPLASTY WITH STENT PLACEMENT  07/03/2014   "2"  . CORONARY STENT INTERVENTION N/A 04/09/2019   Procedure: CORONARY STENT INTERVENTION;  Surgeon: Jettie Booze, MD;  Location: [place] CV LAB;  Service: Cardiovascular;  Laterality: N/A;  . CORONARY STENT INTERVENTION N/A 08/22/2019   Procedure: CORONARY STENT INTERVENTION;  Surgeon: Martinique, Peter M, MD;  Location: [place] CV LAB;  Service: Cardiovascular;  Laterality: N/A;  . ESOPHAGOGASTRODUODENOSCOPY (EGD) WITH PROPOFOL N/A 03/21/2018   Procedure: ESOPHAGOGASTRODUODENOSCOPY (EGD) WITH PROPOFOL;  Surgeon: [name], MD;  Location: [place];  Service: Endoscopy;  Laterality: N/A;  . I & D EXTREMITY Right 10/30/2013   Procedure: IRRIGATION AND DEBRIDEMENT RIGHT SMALL FINGER POSSIBLE SECONDARY WOUND CLOSURE;  Surgeon: [name], MD;  Location: [facility];  Service: Orthopedics;  Laterality: Right;  [name] is available after 4pm  . I & D EXTREMITY Right 11/01/2013   Procedure:  IRRIGATION AND DEBRIDEMENT RIGHT  PROXIMAL PHALANGEAL LEVEL AMPUTATION;  Surgeon: [name], MD;  Location: [facility];  Service: Orthopedics;  Laterality: Right;  . INCISION AND DRAINAGE Right 10/28/2013   Procedure: INCISION AND DRAINAGE Right small finger;  Surgeon: [name], MD;  Location: [facility];  Service: Orthopedics;  Laterality: Right;  . LEFT HEART CATH Left 11/03/2011   Procedure: LEFT HEART CATH;  Surgeon: [name], MD;  Location[facility] CATH LAB;  Service: Cardiovascular;  Laterality: Left;  . LEFT HEART CATH AND CORONARY ANGIOGRAPHY N/A 04/09/2019   Procedure: LEFT HEART CATH AND CORONARY ANGIOGRAPHY;  Surgeon: Jettie Booze, MD;  Location: [place] CV LAB;  Service: Cardiovascular;  Laterality: N/A;  . LEFT HEART CATHETERIZATION WITH CORONARY ANGIOGRAM N/A 07/03/2014   Procedure: LEFT HEART CATHETERIZATION WITH CORONARY ANGIOGRAM;  Surgeon: [name], MD;  Location: [facility] CATH LAB;  Service: Cardiovascular;  Laterality: N/A;  . PERCUTANEOUS CORONARY STENT INTERVENTION (PCI-S) Left 11/03/2011   Procedure: PERCUTANEOUS CORONARY STENT INTERVENTION (PCI-S);   Surgeon: [name], MD;  Location: [facility] CATH LAB;  Service: Cardiovascular;  Laterality: Left;  . RIGHT/LEFT HEART CATH AND CORONARY ANGIOGRAPHY N/A 08/22/2019   Procedure: RIGHT/LEFT HEART CATH AND CORONARY ANGIOGRAPHY;  Surgeon: Martinique, Peter M, MD;  Location: [place] CV LAB;  Service: Cardiovascular;  Laterality: N/A;  . TOE AMPUTATION Right ~ 2010   "2nd toe"  . TOE AMPUTATION Right 2012   "3rd toe"    There were no vitals filed for this visit.   Subjective Assessment - 10/20/20 1417    Subjective "I feel like I am doing pretty good."    Currently in Pain? No/denies              [facility] PT Assessment - 10/20/20 0001      Assessment   Medical Diagnosis frequent falls/ deconditioning     Referring Provider (PT) Preethat Jospeh      Standardized Balance Assessment   Standardized Balance Assessment Berg Balance Test      Berg Balance Test   Sit to Stand Able to stand using hands after several tries    Standing Unsupported Able  to stand 2 minutes with supervision    Sitting with Back Unsupported but Feet Supported on Floor or Stool Able to sit safely and securely 2 minutes    Stand to Sit Uses backs of legs against chair to control descent    Transfers Able to transfer safely, minor use of hands    Standing Unsupported with Eyes Closed Unable to keep eyes closed 3 seconds but stays steady    Standing Unsupported with Feet Together Able to place feet together independently but unable to hold for 30 seconds    From Standing, Reach Forward with Outstretched Arm Can reach forward >12 cm safely (5")    From Standing Position, Pick up Object from Floor Able to pick up shoe, needs supervision    From Standing Position, Turn to Look Behind Over each Shoulder Needs supervision when turning    Turn 360 Degrees Needs close supervision or verbal cueing    Standing Unsupported, Alternately Place Feet on Step/Stool Able to complete 4 steps without aid or supervision    Standing  Unsupported, One Foot in [place] balance while stepping or standing    Standing on One Leg Unable to try or needs assist to prevent fall    Total Score 28                         [facility] Adult PT Treatment/Exercise - 10/20/20 0001      Knee/Hip Exercises: Aerobic   Nustep L5 x 7 min UE/LE      Knee/Hip Exercises: Standing   Hip Extension 2 sets;15 reps    Other Standing Knee Exercises marching in place 2 x 20 alternating L/R    verbal cues to go slow.      Knee/Hip Exercises: Seated   Clamshell with TheraBand Green   2 x 15   Sit to Sand 5 reps;2 sets;without UE support                  PT Education - 10/20/20 1500    Education Details reviewed Berg balance and score, and benefits of using a SPC for promote stability and safety.    Person(s) Educated Patient    Methods Explanation;Verbal cues;Handout    Comprehension Verbalized understanding;Verbal cues required            PT Short Term Goals - 10/20/20 1524      PT SHORT TERM GOAL #1   Title Patient will increase bilateral LE strength to 5/5    Period Weeks    Status Achieved      PT SHORT TERM GOAL #2   Title Patient will require close gaurd with tandem stance    Period Weeks    Status On-going      PT SHORT TERM GOAL #3   Title Patient will complete BERG balance scale and make an appropriate goal    Period Weeks    Status Achieved             PT Long Term Goals - 10/20/20 1524      PT LONG TERM GOAL #1   Title Patient will decrease 5x sit to stand transfer time to 10 seconds to demonstrat improved ability to transfer    Status On-going      PT LONG TERM GOAL #2   Title Patient will increase average 75m walking speed to less then 5 seconds to improve communty ambulation    Period Weeks    Status On-going  PT LONG TERM GOAL #3   Title Patient will report no falls for a 3 weeks period    Period Weeks    Status On-going      PT LONG TERM GOAL #4   Title Patient will  safely ambualte 3000' with single point cane    Period Weeks    Status On-going      PT LONG TERM GOAL #5   Title improve berg balance score to >/ 45/56 to improve balance and stability to maximize safety.    Period Weeks    Status New    Target Date 11/25/20                 Plan - 10/20/20 1505    Clinical Impression Statement pt reports consistency with his HEP and reports he feels he is making good progress. He did score a 28/56 indicating high fall risk and would benefit from use of an assisted device. He did well with hip/ knee strenghtening but does fatigue quickly.    PT Treatment/Interventions ADLs/Self Care Home Management;Electrical Stimulation;Cryotherapy;Iontophoresis 4mg /ml Dexamethasone;Ultrasound;DME Instruction;Gait training;Stair training;Functional mobility training;Therapeutic activities;Therapeutic exercise;Neuromuscular re-education;Patient/family education;Manual techniques;Passive range of motion;Dry needling;Taping    PT Next Visit Plan , begin balance training, review general UE strengthening for HEP; add nu-step for endurance training    PT Home Exercise Plan LAQ, hamstring curl, clamshell    Consulted and Agree with Plan of Care Patient           Patient will benefit from skilled therapeutic intervention in order to improve the following deficits and impairments:  Cardiopulmonary status limiting activity, Pain, Decreased strength, Decreased mobility, Decreased balance, Decreased knowledge of precautions, Impaired flexibility  Visit Diagnosis: Repeated falls  Difficulty in walking, not elsewhere classified  Muscle weakness (generalized)  Localized edema     Problem List Patient Active Problem List   Diagnosis Date Noted  . Hypokalemia 09/25/2020  . Type 2 diabetes mellitus with stage 3 chronic kidney disease ([place]) 09/24/2020  . Unstable angina ([place]) 08/17/2019  . Chest pain 08/16/2019  . NSTEMI (non-ST elevated myocardial infarction) ([place])  04/06/2019  . ACS (acute coronary syndrome) ([place]) 04/06/2019  . Sepsis ([place]) 03/15/2018  . DKA (diabetic ketoacidoses) 03/15/2018  . Special screening for malignant neoplasms, colon 08/22/2017  . Acute renal failure superimposed on stage 3 chronic kidney disease ([place]) 05/19/2017  . Essential hypertension   . Status post transmetatarsal amputation of foot, right ([facility]) 11/08/2016  . Diabetic polyneuropathy associated with type 2 diabetes mellitus ([place]) 11/08/2016  . Pure hypercholesterolemia   . AKI (acute kidney injury) ([place])   . Acute blood loss anemia   . DM type 2 with diabetic peripheral neuropathy ([place])   . Physical debility 08/03/2016  . Chronic osteomyelitis of right foot ([place])   . Cellulitis and abscess of leg, except foot   . Penetrating wound of right foot   . CKD (chronic kidney disease) stage 3, GFR 30-59 ml/min ([facility])   . Unstable angina pectoris ([place]) 07/18/2016  . Chronic combined systolic and diastolic congestive heart failure ([place])   . Chest pain with moderate risk for cardiac etiology 06/24/2014  . CAD S/P multiple PCIs 06/24/2014  . Sleep apnea- on C-pap 06/24/2014  . Morbid obesity due to excess calories ([place]) 11/10/2013  . Hyperglycemia 10/29/2013  . Hyperlipidemia 01/12/2011  . Benign essential HTN 01/12/2011  . GERD 01/12/2011   Starr [place] PT, DPT, LAT, ATC  10/20/20  3:27 PM      [place]  [facility] [address] [place], [place], [address] Phone: [phone]   Fax:  [phone]  Name: SOREN LAZARZ MRN: [id number] Date of Birth: Apr 12, 1956

## 2020-10-27 ENCOUNTER — Encounter: Payer: Self-pay | Admitting: Physical Therapy

## 2020-10-27 ENCOUNTER — Other Ambulatory Visit: Payer: Self-pay

## 2020-10-27 ENCOUNTER — Ambulatory Visit: Payer: Medicare Other | Admitting: Physical Therapy

## 2020-10-27 DIAGNOSIS — R296 Repeated falls: Secondary | ICD-10-CM

## 2020-10-27 DIAGNOSIS — R262 Difficulty in walking, not elsewhere classified: Secondary | ICD-10-CM

## 2020-10-27 DIAGNOSIS — R600 Localized edema: Secondary | ICD-10-CM

## 2020-10-27 DIAGNOSIS — M6281 Muscle weakness (generalized): Secondary | ICD-10-CM

## 2020-10-27 NOTE — Patient Instructions (Signed)
Access Code: [email or web address]: [email or web address]: 11/15/2021Prepared by: Anderson Malta PaaExercises  Single Leg Stance with Support - 2 x daily - 7 x weekly - 2 sets - 10 reps - 10-15 hold  Tandem Walking with Counter Support - 2 x daily - 7 x weekly - 2 sets - 10 reps  Sit to Stand with Counter Support - 2 x daily - 7 x weekly - 2 sets - 10 reps

## 2020-10-27 NOTE — Therapy (Signed)
[place], [place], [address] Phone: [phone]   Fax:  [phone]  Physical Therapy Treatment  Patient Details  Name: Joshua [name] MRN: [id number] Date of Birth: Jan 26, 1956 Referring Provider (PT): Preethat Jospeh   Encounter Date: 10/27/2020   PT End of Session - 10/27/20 1413    Visit Number 3    Number of Visits 12    Date for PT Re-Evaluation 11/24/20    Authorization Type Medicare/ medicaid    PT Start Time 1407    PT Stop Time 1446    PT Time Calculation (min) 39 min    Activity Tolerance Patient tolerated treatment well    Behavior During Therapy [facility] for tasks assessed/performed           Past Medical History:  Diagnosis Date  . Anemia   . Chronic combined systolic and diastolic CHF (congestive heart failure) ([place])    a. 07/2015 Echo: EF 45-50%, mod LVH, mid apical/antsept AK, Gr 1 DD.  [name] CKD (chronic kidney disease), stage III ([place])   . Coronary artery disease    a. 2012 s/p MI/PCI: s/p DES to mid/dist RCA and overlapping DES to LAD;  b. 07/2015 Cath: LAD 10% ISR, D1 40(jailed), LCX 28m, OM2 30, OM3 30, RCA 32m ISR, 10d ISR; c. 07/2016 NSTEMI/PCI: LAD 85ost/p/m (3.5x20 Synergy DES overlapping prior stent), D1 40, LCX 72m, OM2 95 (2.75x16 Synergy DES), OM3 30, RCA 61m, 10d.  . Depression   . Diabetic gastroparesis ([place])   . DKA (diabetic ketoacidoses) 03/14/2018  . GERD (gastroesophageal reflux disease)   . Gout   . Heart murmur   . Hyperlipemia   . Hypertension   . Hypertensive heart disease   . Ischemic cardiomyopathy    a. 07/2015 Echo: EF 45-50%, mod LVH, mid-apicalanteroseptal AK, Gr1 DD.  . Morbid obesity ([place])   . OSA on CPAP   . Osteomyelitis ([place])    a. 07/2016 MTP joint of R great toe s/p transmetatarsal amputation.  . Polyneuropathy in diabetes(357.2)   . Pulmonary sarcoidosis ([place])    "problems w/it years ago; no problems anymore" (07/03/2014)  . Rectal bleeding   .  Sleep apnea   . Type II diabetes mellitus ([place])     Past Surgical History:  Procedure Laterality Date  . AMPUTATION Right 07/24/2016   Procedure: TRANSMETATARSAL FOOT AMPUTATION;  Surgeon: [name], MD;  Location: [place];  Service: Orthopedics;  Laterality: Right;  . BALLOON DILATION N/A 03/21/2018   Procedure: BALLOON DILATION;  Surgeon: [name], MD;  Location: [facility] ENDOSCOPY;  Service: Endoscopy;  Laterality: N/A;  . BREAST LUMPECTOMY Right ~ 2000   "benign"  . CARDIAC CATHETERIZATION N/A 07/30/2015   Procedure: Left Heart Cath and Coronary Angiography;  Surgeon: Burnell Blanks, MD;  Location: [place] CV LAB;  Service: Cardiovascular;  Laterality: N/A;  . CARDIAC CATHETERIZATION N/A 07/19/2016   Procedure: Left Heart Cath and Coronary Angiography;  Surgeon: Belva Crome, MD;  Location: [place] CV LAB;  Service: Cardiovascular;  Laterality: N/A;  . CARDIAC CATHETERIZATION N/A 07/29/2016   Procedure: Coronary Stent Intervention;  Surgeon: Belva Crome, MD;  Location: [place] CV LAB;  Service: Cardiovascular;  Laterality: N/A;  . CARDIAC CATHETERIZATION N/A 07/29/2016   Procedure: Intravascular Ultrasound/IVUS;  Surgeon: Belva Crome, MD;  Location: [place] CV LAB;  Service: Cardiovascular;  Laterality: N/A;  . CATARACT EXTRACTION W/ INTRAOCULAR LENS  IMPLANT, BILATERAL Bilateral 2014-2015  . COLONOSCOPY  WITH PROPOFOL N/A 08/22/2017   Procedure: COLONOSCOPY WITH PROPOFOL;  Surgeon: [name], MD;  Location: [place];  Service: Endoscopy;  Laterality: N/A;  . CORONARY ANGIOPLASTY WITH STENT PLACEMENT  10/31/2011   30 % MID ATRIOVENTRICULAR GROOVE CX STENOSIS. 90 % MID RCA.PTA TO LAD/STENTING OF TANDEM 60-70%. LAD STENOSIS 99% REDUCED TO 0%.3.0 X 32MM PROMUS DES POSTDILATED TO 3.25MM.  [name] CORONARY ANGIOPLASTY WITH STENT PLACEMENT  07/03/2014   "2"  . CORONARY STENT INTERVENTION N/A 04/09/2019   Procedure: CORONARY STENT INTERVENTION;  Surgeon: Jettie Booze, MD;  Location: [place] CV LAB;  Service: Cardiovascular;  Laterality: N/A;  . CORONARY STENT INTERVENTION N/A 08/22/2019   Procedure: CORONARY STENT INTERVENTION;  Surgeon: Martinique, Peter M, MD;  Location: [place] CV LAB;  Service: Cardiovascular;  Laterality: N/A;  . ESOPHAGOGASTRODUODENOSCOPY (EGD) WITH PROPOFOL N/A 03/21/2018   Procedure: ESOPHAGOGASTRODUODENOSCOPY (EGD) WITH PROPOFOL;  Surgeon: [name], MD;  Location: [place];  Service: Endoscopy;  Laterality: N/A;  . I & D EXTREMITY Right 10/30/2013   Procedure: IRRIGATION AND DEBRIDEMENT RIGHT SMALL FINGER POSSIBLE SECONDARY WOUND CLOSURE;  Surgeon: [name], MD;  Location: [facility];  Service: Orthopedics;  Laterality: Right;  [name] is available after 4pm  . I & D EXTREMITY Right 11/01/2013   Procedure:  IRRIGATION AND DEBRIDEMENT RIGHT  PROXIMAL PHALANGEAL LEVEL AMPUTATION;  Surgeon: [name], MD;  Location: [facility];  Service: Orthopedics;  Laterality: Right;  . INCISION AND DRAINAGE Right 10/28/2013   Procedure: INCISION AND DRAINAGE Right small finger;  Surgeon: [name], MD;  Location: [facility];  Service: Orthopedics;  Laterality: Right;  . LEFT HEART CATH Left 11/03/2011   Procedure: LEFT HEART CATH;  Surgeon: [name], MD;  Location: [facility] CATH LAB;  Service: Cardiovascular;  Laterality: Left;  . LEFT HEART CATH AND CORONARY ANGIOGRAPHY N/A 04/09/2019   Procedure: LEFT HEART CATH AND CORONARY ANGIOGRAPHY;  Surgeon: Jettie Booze, MD;  Location: [place] CV LAB;  Service: Cardiovascular;  Laterality: N/A;  . LEFT HEART CATHETERIZATION WITH CORONARY ANGIOGRAM N/A 07/03/2014   Procedure: LEFT HEART CATHETERIZATION WITH CORONARY ANGIOGRAM;  Surgeon: [name], MD;  Location: [facility] CATH LAB;  Service: Cardiovascular;  Laterality: N/A;  . PERCUTANEOUS CORONARY STENT INTERVENTION (PCI-S) Left 11/03/2011   Procedure: PERCUTANEOUS CORONARY STENT INTERVENTION (PCI-S);   Surgeon: [name], MD;  Location: [facility] CATH LAB;  Service: Cardiovascular;  Laterality: Left;  . RIGHT/LEFT HEART CATH AND CORONARY ANGIOGRAPHY N/A 08/22/2019   Procedure: RIGHT/LEFT HEART CATH AND CORONARY ANGIOGRAPHY;  Surgeon: Martinique, Peter M, MD;  Location: [place] CV LAB;  Service: Cardiovascular;  Laterality: N/A;  . TOE AMPUTATION Right ~ 2010   "2nd toe"  . TOE AMPUTATION Right 2012   "3rd toe"    There were no vitals filed for this visit.   Subjective Assessment - 10/27/20 1410    Subjective Have some soreness in my low back. I feel good after I do those exercises.    Currently in Pain? Yes    Pain Score 1     Pain Location Back    Pain Orientation Right    Pain Descriptors / Indicators Sore    Pain Type Chronic pain    Pain Onset More than a month ago    Pain Frequency Intermittent    Aggravating Factors  not sure    Pain Relieving Factors OTC meds    Multiple Pain Sites No  [facility] Adult PT Treatment/Exercise - 10/27/20 0001      High Level Balance   High Level Balance Activities Side stepping;Backward walking;Tandem walking      Self-Care   Self-Care --   see education      Knee/Hip Exercises: Aerobic   Nustep L5 x 7 min UE/LE      Knee/Hip Exercises: Standing   Heel Raises Limitations small ROM x 10     Hip Flexion Limitations x 10 high march for balance     Hip Abduction Stengthening;Both;1 set;10 reps    Functional Squat 1 set;15 reps    SLS 3 trials up to 15 sec each side with definitel need for UE assist (parallel bars)       Knee/Hip Exercises: Seated   Sit to Sand 1 set;10 reps                  PT Education - 10/27/20 1532    Education Details balance exercises, possible causes of falling, knee hyperextension    Person(s) Educated Patient    Methods Explanation;Handout    Comprehension Verbalized understanding;Returned demonstration            PT Short Term Goals - 10/20/20 1524      PT SHORT TERM GOAL #1    Title Patient will increase bilateral LE strength to 5/5    Period Weeks    Status Achieved      PT SHORT TERM GOAL #2   Title Patient will require close gaurd with tandem stance    Period Weeks    Status On-going      PT SHORT TERM GOAL #3   Title Patient will complete BERG balance scale and make an appropriate goal    Period Weeks    Status Achieved             PT Long Term Goals - 10/20/20 1524      PT LONG TERM GOAL #1   Title Patient will decrease 5x sit to stand transfer time to 10 seconds to demonstrat improved ability to transfer    Status On-going      PT LONG TERM GOAL #2   Title Patient will increase average 17m walking speed to less then 5 seconds to improve communty ambulation    Period Weeks    Status On-going      PT LONG TERM GOAL #3   Title Patient will report no falls for a 3 weeks period    Period Weeks    Status On-going      PT LONG TERM GOAL #4   Title Patient will safely ambualte 3000' with single point cane    Period Weeks    Status On-going      PT LONG TERM GOAL #5   Title improve berg balance score to >/ 45/56 to improve balance and stability to maximize safety.    Period Weeks    Status New    Target Date 11/25/20                 Plan - 10/27/20 1445    Clinical Impression Statement Began to work on more dynamic balance and gait training in the parallel bars.  He had a mild degree of low back pain during session, LLE burning.  He may have a MRI on his back. 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  Discussed anxiety and fear of falling, imbalance too as it relates to his audition, performance.    PT  Next Visit Plan check HEP, general hip and core, progress balance challenges, watch Rt knee for instabiity    PT Home Exercise Plan LAQ, hamstring curl, clamshell, STS, SLS and tandem    Consulted and Agree with Plan of Care Patient           Patient will benefit from skilled therapeutic intervention in order to improve the  following deficits and impairments:  Cardiopulmonary status limiting activity, Pain, Decreased strength, Decreased mobility, Decreased balance, Decreased knowledge of precautions, Impaired flexibility  Visit Diagnosis: Repeated falls  Muscle weakness (generalized)  Difficulty in walking, not elsewhere classified  Localized edema     Problem List Patient Active Problem List   Diagnosis Date Noted  . Hypokalemia 09/25/2020  . Type 2 diabetes mellitus with stage 3 chronic kidney disease ([place]) 09/24/2020  . Unstable angina ([place]) 08/17/2019  . Chest pain 08/16/2019  . NSTEMI (non-ST elevated myocardial infarction) ([place]) 04/06/2019  . ACS (acute coronary syndrome) ([place]) 04/06/2019  . Sepsis ([place]) 03/15/2018  . DKA (diabetic ketoacidoses) 03/15/2018  . Special screening for malignant neoplasms, colon 08/22/2017  . Acute renal failure superimposed on stage 3 chronic kidney disease ([place]) 05/19/2017  . Essential hypertension   . Status post transmetatarsal amputation of foot, right ([place]) 11/08/2016  . Diabetic polyneuropathy associated with type 2 diabetes mellitus ([place]) 11/08/2016  . Pure hypercholesterolemia   . AKI (acute kidney injury) ([place])   . Acute blood loss anemia   . DM type 2 with diabetic peripheral neuropathy ([place])   . Physical debility 08/03/2016  . Chronic osteomyelitis of right foot ([place])   . Cellulitis and abscess of leg, except foot   . Penetrating wound of right foot   . CKD (chronic kidney disease) stage 3, GFR 30-59 ml/min ([facility])   . Unstable angina pectoris ([place]) 07/18/2016  . Chronic combined systolic and diastolic congestive heart failure ([place])   . Chest pain with moderate risk for cardiac etiology 06/24/2014  . CAD S/P multiple PCIs 06/24/2014  . Sleep apnea- on C-pap 06/24/2014  . Morbid obesity due to excess calories ([place]) 11/10/2013  . Hyperglycemia 10/29/2013  . Hyperlipidemia 01/12/2011  . Benign essential HTN 01/12/2011  . GERD 01/12/2011     Naomii Kreger 10/27/2020, 3:38 PM  [facility] [address] [place], [place], [address] Phone: [phone]   Fax:  [phone]  Name: YOUSSOUF SHIPLEY MRN: [id number] Date of Birth: Jan 24, 1956  Raeford Razor, PT 10/27/20 3:38 PM Phone: [phone] Fax: [phone]

## 2020-11-03 ENCOUNTER — Ambulatory Visit: Payer: Medicare Other | Admitting: Physical Therapy

## 2020-11-03 ENCOUNTER — Encounter: Payer: Self-pay | Admitting: Physical Therapy

## 2020-11-03 ENCOUNTER — Other Ambulatory Visit: Payer: Self-pay

## 2020-11-03 DIAGNOSIS — M6281 Muscle weakness (generalized): Secondary | ICD-10-CM

## 2020-11-03 DIAGNOSIS — R296 Repeated falls: Secondary | ICD-10-CM

## 2020-11-03 DIAGNOSIS — R600 Localized edema: Secondary | ICD-10-CM

## 2020-11-03 DIAGNOSIS — R262 Difficulty in walking, not elsewhere classified: Secondary | ICD-10-CM

## 2020-11-04 ENCOUNTER — Encounter: Payer: Self-pay | Admitting: Physical Therapy

## 2020-11-04 NOTE — Therapy (Signed)
[place], [place], [address] Phone: [phone]   Fax:  [phone]  Physical Therapy Treatment  Patient Details  Name: Joshua [name] MRN: [id number] Date of Birth: 1956/04/14 Referring Provider (PT): Preethat Jospeh   Encounter Date: 11/03/2020   PT End of Session - 11/03/20 1508    Visit Number 4    Number of Visits 12    Date for PT Re-Evaluation 11/24/20    Authorization Type Medicare/ medicaid    PT Start Time 0302    PT Stop Time 0345    PT Time Calculation (min) 43 min    Activity Tolerance Patient tolerated treatment well    Behavior During Therapy [facility] for tasks assessed/performed           Past Medical History:  Diagnosis Date  . Anemia   . Chronic combined systolic and diastolic CHF (congestive heart failure) ([place])    a. 07/2015 Echo: EF 45-50%, mod LVH, mid apical/antsept AK, Gr 1 DD.  [name] CKD (chronic kidney disease), stage III ([place])   . Coronary artery disease    a. 2012 s/p MI/PCI: s/p DES to mid/dist RCA and overlapping DES to LAD;  b. 07/2015 Cath: LAD 10% ISR, D1 40(jailed), LCX 21m, OM2 30, OM3 30, RCA 52m ISR, 10d ISR; c. 07/2016 NSTEMI/PCI: LAD 85ost/p/m (3.5x20 Synergy DES overlapping prior stent), D1 40, LCX 46m, OM2 95 (2.75x16 Synergy DES), OM3 30, RCA 37m, 10d.  . Depression   . Diabetic gastroparesis ([place])   . DKA (diabetic ketoacidoses) 03/14/2018  . GERD (gastroesophageal reflux disease)   . Gout   . Heart murmur   . Hyperlipemia   . Hypertension   . Hypertensive heart disease   . Ischemic cardiomyopathy    a. 07/2015 Echo: EF 45-50%, mod LVH, mid-apicalanteroseptal AK, Gr1 DD.  . Morbid obesity ([place])   . OSA on CPAP   . Osteomyelitis ([place])    a. 07/2016 MTP joint of R great toe s/p transmetatarsal amputation.  . Polyneuropathy in diabetes(357.2)   . Pulmonary sarcoidosis ([place])    "problems w/it years ago; no problems anymore" (07/03/2014)  . Rectal bleeding   .  Sleep apnea   . Type II diabetes mellitus ([place])     Past Surgical History:  Procedure Laterality Date  . AMPUTATION Right 07/24/2016   Procedure: TRANSMETATARSAL FOOT AMPUTATION;  Surgeon: [name], MD;  Location: [place];  Service: Orthopedics;  Laterality: Right;  . BALLOON DILATION N/A 03/21/2018   Procedure: BALLOON DILATION;  Surgeon: [name], MD;  Location[facility] ENDOSCOPY;  Service: Endoscopy;  Laterality: N/A;  . BREAST LUMPECTOMY Right ~ 2000   "benign"  . CARDIAC CATHETERIZATION N/A 07/30/2015   Procedure: Left Heart Cath and Coronary Angiography;  Surgeon: Burnell Blanks, MD;  Location: [place] CV LAB;  Service: Cardiovascular;  Laterality: N/A;  . CARDIAC CATHETERIZATION N/A 07/19/2016   Procedure: Left Heart Cath and Coronary Angiography;  Surgeon: Belva Crome, MD;  Location: [place] CV LAB;  Service: Cardiovascular;  Laterality: N/A;  . CARDIAC CATHETERIZATION N/A 07/29/2016   Procedure: Coronary Stent Intervention;  Surgeon: Belva Crome, MD;  Location: [place] CV LAB;  Service: Cardiovascular;  Laterality: N/A;  . CARDIAC CATHETERIZATION N/A 07/29/2016   Procedure: Intravascular Ultrasound/IVUS;  Surgeon: Belva Crome, MD;  Location: [place] CV LAB;  Service: Cardiovascular;  Laterality: N/A;  . CATARACT EXTRACTION W/ INTRAOCULAR LENS  IMPLANT, BILATERAL Bilateral 2014-2015  . COLONOSCOPY  WITH PROPOFOL N/A 08/22/2017   Procedure: COLONOSCOPY WITH PROPOFOL;  Surgeon: [name], MD;  Location: [place];  Service: Endoscopy;  Laterality: N/A;  . CORONARY ANGIOPLASTY WITH STENT PLACEMENT  10/31/2011   30 % MID ATRIOVENTRICULAR GROOVE CX STENOSIS. 90 % MID RCA.PTA TO LAD/STENTING OF TANDEM 60-70%. LAD STENOSIS 99% REDUCED TO 0%.3.0 X 32MM PROMUS DES POSTDILATED TO 3.25MM.  [name] CORONARY ANGIOPLASTY WITH STENT PLACEMENT  07/03/2014   "2"  . CORONARY STENT INTERVENTION N/A 04/09/2019   Procedure: CORONARY STENT INTERVENTION;  Surgeon: Jettie Booze, MD;  Location: [place] CV LAB;  Service: Cardiovascular;  Laterality: N/A;  . CORONARY STENT INTERVENTION N/A 08/22/2019   Procedure: CORONARY STENT INTERVENTION;  Surgeon: Martinique, Peter M, MD;  Location: [place] [facility];  Service: Cardiovascular;  Laterality: N/A;  . ESOPHAGOGASTRODUODENOSCOPY (EGD) WITH PROPOFOL N/A 03/21/2018   Procedure: ESOPHAGOGASTRODUODENOSCOPY (EGD) WITH PROPOFOL;  Surgeon: [name], MD;  Location: [place];  Service: Endoscopy;  Laterality: N/A;  . I & D EXTREMITY Right 10/30/2013   Procedure: IRRIGATION AND DEBRIDEMENT RIGHT SMALL FINGER POSSIBLE SECONDARY WOUND CLOSURE;  Surgeon: [name], MD;  Location: [facility];  Service: Orthopedics;  Laterality: Right;  [name] is available after 4pm  . I & D EXTREMITY Right 11/01/2013   Procedure:  IRRIGATION AND DEBRIDEMENT RIGHT  PROXIMAL PHALANGEAL LEVEL AMPUTATION;  Surgeon: [name], MD;  Location: [facility];  Service: Orthopedics;  Laterality: Right;  . INCISION AND DRAINAGE Right 10/28/2013   Procedure: INCISION AND DRAINAGE Right small finger;  Surgeon: [name], MD;  Location: [facility];  Service: Orthopedics;  Laterality: Right;  . LEFT HEART CATH Left 11/03/2011   Procedure: LEFT HEART CATH;  Surgeon: [name], MD;  Location: [facility] CATH LAB;  Service: Cardiovascular;  Laterality: Left;  . LEFT HEART CATH AND CORONARY ANGIOGRAPHY N/A 04/09/2019   Procedure: LEFT HEART CATH AND CORONARY ANGIOGRAPHY;  Surgeon: Jettie Booze, MD;  Location: [place] CV LAB;  Service: Cardiovascular;  Laterality: N/A;  . LEFT HEART CATHETERIZATION WITH CORONARY ANGIOGRAM N/A 07/03/2014   Procedure: LEFT HEART CATHETERIZATION WITH CORONARY ANGIOGRAM;  Surgeon: [name], MD;  Location: [facility] CATH LAB;  Service: Cardiovascular;  Laterality: N/A;  . PERCUTANEOUS CORONARY STENT INTERVENTION (PCI-S) Left 11/03/2011   Procedure: PERCUTANEOUS CORONARY STENT INTERVENTION (PCI-S);   Surgeon: [name], MD;  Location: [facility] CATH LAB;  Service: Cardiovascular;  Laterality: Left;  . RIGHT/LEFT HEART CATH AND CORONARY ANGIOGRAPHY N/A 08/22/2019   Procedure: RIGHT/LEFT HEART CATH AND CORONARY ANGIOGRAPHY;  Surgeon: Martinique, Peter M, MD;  Location: [place] CV LAB;  Service: Cardiovascular;  Laterality: N/A;  . TOE AMPUTATION Right ~ 2010   "2nd toe"  . TOE AMPUTATION Right 2012   "3rd toe"    There were no vitals filed for this visit.   Subjective Assessment - 11/03/20 1506    Subjective Patient reports some of the balancing exercises have been tough.    Pertinent History anemia, CAD, CKD, DKA,    How long can you sit comfortably? no limit    How long can you stand comfortably? fatigues after time    How long can you walk comfortably? can walk with device    Currently in Pain? Yes    Pain Score 2     Pain Location Back    Pain Orientation Right    Pain Descriptors / Indicators Aching    Pain Type Chronic pain  Pain Onset More than a month ago    Pain Frequency Intermittent    Aggravating Factors  not sure    Pain Relieving Factors OTC meds    Multiple Pain Sites No                             [facility] Adult PT Treatment/Exercise - 11/04/20 0001      Neuro Re-ed    Neuro Re-ed Details  narrow base eyes oppened and closed 3x 30 sec each; tandem stance 3x20 sec each leg. Unable to come to full tandem; step onto and off air-ex x10 each leg;narrow abase on air-ex x10       Knee/Hip Exercises: Aerobic   Nustep L5 x 7 min UE/LE      Knee/Hip Exercises: Standing   Heel Raises Limitations small ROM x 10     Hip Flexion Limitations 2x 10 high march for balance       Knee/Hip Exercises: Seated   Sit to Sand 1 set;10 reps      Knee/Hip Exercises: Supine   Other Supine Knee/Hip Exercises Supine March x15     Other Supine Knee/Hip Exercises Supine ball squeeze with breathing x15; supine bridge x15                  PT Education -  11/03/20 1507    Education Details HEPand symptom management    Person(s) Educated Patient    Methods Explanation;Demonstration;Tactile cues;Verbal cues    Comprehension Verbalized understanding;Returned demonstration;Verbal cues required;Tactile cues required            PT Short Term Goals - 10/20/20 1524      PT SHORT TERM GOAL #1   Title Patient will increase bilateral LE strength to 5/5    Period Weeks    Status Achieved      PT SHORT TERM GOAL #2   Title Patient will require close gaurd with tandem stance    Period Weeks    Status On-going      PT SHORT TERM GOAL #3   Title Patient will complete BERG balance scale and make an appropriate goal    Period Weeks    Status Achieved             PT Long Term Goals - 10/20/20 1524      PT LONG TERM GOAL #1   Title Patient will decrease 5x sit to stand transfer time to 10 seconds to demonstrat improved ability to transfer    Status On-going      PT LONG TERM GOAL #2   Title Patient will increase average 54m walking speed to less then 5 seconds to improve communty ambulation    Period Weeks    Status On-going      PT LONG TERM GOAL #3   Title Patient will report no falls for a 3 weeks period    Period Weeks    Status On-going      PT LONG TERM GOAL #4   Title Patient will safely ambualte 3000' with single point cane    Period Weeks    Status On-going      PT LONG TERM GOAL #5   Title improve berg balance score to >/ 45/56 to improve balance and stability to maximize safety.    Period Weeks    Status New    Target Date 11/25/20  Plan - 11/03/20 1528    Clinical Impression Statement Patient requires increased assist with balance training with his eyes closed. He tolerated all other ther-ex well. He wwas given suopine ther-ex for home. He tolerated exercises well. he had mild fatigue after the appointment. Therapy will continue to progress as tolerated.    Personal Factors and Comorbidities  Comorbidity 1;Comorbidity 2;Comorbidity 3+;Fitness    Comorbidities peripheral neuropathy, Stage III CKD, depression, caridaic issues, right great tow ambutation and 2nd toe    Examination-Activity Limitations Bend;Carry;Dressing;Stairs;Squat;Locomotion Level    Examination-Participation Restrictions Laundry;Community Activity    Stability/Clinical Decision Making Evolving/Moderate complexity    PT Treatment/Interventions ADLs/Self Care Home Management;Electrical Stimulation;Cryotherapy;Iontophoresis 4mg /ml Dexamethasone;Ultrasound;DME Instruction;Gait training;Stair training;Functional mobility training;Therapeutic activities;Therapeutic exercise;Neuromuscular re-education;Patient/family education;Manual techniques;Passive range of motion;Dry needling;Taping    PT Next Visit Plan reviewed HEP and symptom mangement    PT Home Exercise Plan LAQ, hamstring curl, clamshell, STS, SLS and tandem    Consulted and Agree with Plan of Care Patient           Patient will benefit from skilled therapeutic intervention in order to improve the following deficits and impairments:  Cardiopulmonary status limiting activity, Pain, Decreased strength, Decreased mobility, Decreased balance, Decreased knowledge of precautions, Impaired flexibility  Visit Diagnosis: Repeated falls  Muscle weakness (generalized)  Difficulty in walking, not elsewhere classified  Localized edema     Problem List Patient Active Problem List   Diagnosis Date Noted  . Hypokalemia 09/25/2020  . Type 2 diabetes mellitus with stage 3 chronic kidney disease ([place]) 09/24/2020  . Unstable angina ([place]) 08/17/2019  . Chest pain 08/16/2019  . NSTEMI (non-ST elevated myocardial infarction) ([place]) 04/06/2019  . ACS (acute coronary syndrome) ([place]) 04/06/2019  . Sepsis ([place]) 03/15/2018  . DKA (diabetic ketoacidoses) 03/15/2018  . Special screening for malignant neoplasms, colon 08/22/2017  . Acute renal failure superimposed on stage 3  chronic kidney disease ([place]) 05/19/2017  . Essential hypertension   . Status post transmetatarsal amputation of foot, right ([place]) 11/08/2016  . Diabetic polyneuropathy associated with type 2 diabetes mellitus ([place]) 11/08/2016  . Pure hypercholesterolemia   . AKI (acute kidney injury) ([place])   . Acute blood loss anemia   . DM type 2 with diabetic peripheral neuropathy ([place])   . Physical debility 08/03/2016  . Chronic osteomyelitis of right foot ([place])   . Cellulitis and abscess of leg, except foot   . Penetrating wound of right foot   . CKD (chronic kidney disease) stage 3, GFR 30-59 ml/min ([facility])   . Unstable angina pectoris ([place]) 07/18/2016  . Chronic combined systolic and diastolic congestive heart failure ([place])   . Chest pain with moderate risk for cardiac etiology 06/24/2014  . CAD S/P multiple PCIs 06/24/2014  . Sleep apnea- on C-pap 06/24/2014  . Morbid obesity due to excess calories ([place]) 11/10/2013  . Hyperglycemia 10/29/2013  . Hyperlipidemia 01/12/2011  . Benign essential HTN 01/12/2011  . GERD 01/12/2011    Carney Living 11/04/2020, 9:32 AM  [facility] [address] [place], [place], [address] Phone: [phone]   Fax:  [phone]  Name: Joshua [name] MRN: [id number] Date of Birth: 08-Sep-1956

## 2020-11-10 ENCOUNTER — Other Ambulatory Visit: Payer: Self-pay

## 2020-11-10 ENCOUNTER — Telehealth (HOSPITAL_COMMUNITY): Payer: Self-pay | Admitting: Emergency Medicine

## 2020-11-10 ENCOUNTER — Other Ambulatory Visit: Payer: Self-pay | Admitting: Internal Medicine

## 2020-11-10 ENCOUNTER — Ambulatory Visit: Payer: Medicare Other | Admitting: Physical Therapy

## 2020-11-10 ENCOUNTER — Telehealth: Payer: Self-pay | Admitting: Unknown Physician Specialty

## 2020-11-10 DIAGNOSIS — E1159 Type 2 diabetes mellitus with other circulatory complications: Secondary | ICD-10-CM

## 2020-11-10 MED ORDER — NOVOLOG FLEXPEN 100 UNIT/ML ~~LOC~~ SOPN: 5.0000 [IU] | PEN_INJECTOR | Freq: Three times a day (TID) | SUBCUTANEOUS | 2 refills | Status: DC

## 2020-11-10 NOTE — Telephone Encounter (Signed)
Inbound fax from patient insurance [employer] regarding prescription for Insulin Aspa Inj Flexpen. Re ordered Novolog to [facility].

## 2020-11-10 NOTE — Telephone Encounter (Signed)
Called pt and explained possible monoclonal antibody treatment. Sx onset 11/20. Tested positive 11/27 at [facility] on [facility]. Sx include nasal congestion. Qualifying risk factors include HTN, DM2, CAD, unstable angina pectoris, NSTEMI, ACS, and BMI 43.3. Pt vaccinated with [place] and received booster around 1 month ago. Pt spouse is hospitalized with COVID pneumonia. Pt interested in tx. Informed pt an APP will call back to possibly schedule an appointment.

## 2020-11-10 NOTE — Telephone Encounter (Signed)
Called to discuss with Joshua [name] about Covid symptoms and the use of  monoclonal antibody infusion for those with mild to moderate Covid symptoms and at a high risk of hospitalization.     Pt does not qualify for infusion therapy as his symptoms first presented > 10 days prior to timing of infusion. Symptoms tier reviewed as well as criteria for ending isolation. Preventative practices reviewed. Patient verbalized understanding

## 2020-11-12 ENCOUNTER — Ambulatory Visit: Payer: Self-pay | Admitting: Neurology

## 2020-11-14 ENCOUNTER — Ambulatory Visit: Payer: Medicare Other | Admitting: Internal Medicine

## 2020-11-17 ENCOUNTER — Ambulatory Visit: Payer: Medicaid Other | Admitting: Neurology

## 2020-11-27 ENCOUNTER — Ambulatory Visit: Payer: Medicare Other | Admitting: Physical Therapy

## 2020-12-16 ENCOUNTER — Other Ambulatory Visit: Payer: Self-pay

## 2020-12-16 ENCOUNTER — Ambulatory Visit (INDEPENDENT_AMBULATORY_CARE_PROVIDER_SITE_OTHER): Payer: Medicare Other | Admitting: Neurology

## 2020-12-16 ENCOUNTER — Encounter: Payer: Self-pay | Admitting: Neurology

## 2020-12-16 VITALS — BP 179/90 | HR 84 | Ht 69.0 in | Wt 311.0 lb

## 2020-12-16 DIAGNOSIS — G8929 Other chronic pain: Secondary | ICD-10-CM

## 2020-12-16 DIAGNOSIS — IMO0002 Reserved for concepts with insufficient information to code with codable children: Secondary | ICD-10-CM

## 2020-12-16 DIAGNOSIS — E1122 Type 2 diabetes mellitus with diabetic chronic kidney disease: Secondary | ICD-10-CM

## 2020-12-16 DIAGNOSIS — E1165 Type 2 diabetes mellitus with hyperglycemia: Secondary | ICD-10-CM

## 2020-12-16 DIAGNOSIS — M545 Low back pain, unspecified: Secondary | ICD-10-CM

## 2020-12-16 DIAGNOSIS — E1142 Type 2 diabetes mellitus with diabetic polyneuropathy: Secondary | ICD-10-CM | POA: Diagnosis not present

## 2020-12-16 MED ORDER — GABAPENTIN 100 MG PO CAPS: 100.0000 mg | ORAL_CAPSULE | Freq: Every day | ORAL | 5 refills | Status: DC

## 2020-12-16 NOTE — Progress Notes (Signed)
Subjective:    Patient ID: Joshua [name] is a 65 y.o. male.  HPI     Interim history:   [name] is a 65 year old right-handed gentleman with an underlying medical history of hypertension, type 2 diabetes, gout, right foot osteomyelitis with s/p amputation R forefoot in 07/2016, coronary artery disease, chronic kidney disease, and associated anemia and hyper parathyroidism, CHF, OSA on CPAP, and morbid obesity with a BMI of over 95, who Presents for follow-up consultation of his gait disorder and recurrent falls, as well as neuropathy.  The patient is unaccompanied today.  I first met him on 06/09/2020 at the request of [name], at which time he reported a several month history of gait and balance problems as well as falls.  Examination was supportive of underlying neuropathy.  He also reported low back pain.  He was advised to proceed with a lumbar spine MRI, labs and EMG testing of the lower extremities.  His insurance denied a lumbar spine MRI.  He was advised to follow-up with his primary care physician regarding low back pain and discuss a referral to a spine specialist.  His labs from 06/09/2020 showed an elevated B12, RPR was negative, TSH normal, heavy metals negative, B6 normal, ANA negative, CK level elevated at 582, had been elevated in the range of 492-686 for the past 9 years. He had an EMG nerve conduction velocity test in the left lower and upper extremities on 07/16/2020 and I reviewed the results:  IMPRESSION:   Nerve conduction studies done on the left upper and left lower extremity shows evidence of a severe primarily axonal peripheral neuropathy, possibly secondary to diabetes.  EMG of the left lower extremity shows distal acute and chronic denervation consistent with a diagnosis of peripheral neuropathy without clear evidence of an overlying lumbosacral radiculopathy.  He was advised of the test results.  Today, 12/16/20: He reports that he has pain mostly in the left leg,  mostly at night, his foot hurts.  Sometimes it is better.  He was taken off of several medications when he was hospitalized recently for kidney failure.  He was advised to reduce his furosemide.  He is trying to hydrate better.  He has not seen a spine specialist but has mentioned that to his primary care physician.  He is also scheduled to see his cardiologist.  He has been taken off his cholesterol medication. He was hospitalized in October 2021 for acute on chronic kidney injury.  His A1c on 08/15/2020 was 7.5, improved.  He reports that he fell this past weekend.  He was trying to get out of the driver seat of his car but his left foot got caught and he had a scrape on his left knee.  He still has some soreness.  The patient's allergies, current medications, family history, past medical history, past social history, past surgical history and problem list were reviewed and updated as appropriate.  Previously:   06/09/20: (He) reports problems with his gait and balance and recurrent falls for the past several months.  He denies any burning sensation in his feet but has been told that he likely has neuropathy.  I reviewed your office note from 06/01/2020. His BUN was 24, creatinine 2.92 at the time. He is followed by endocrinology. He has had uncontrolled diabetes, A1c was over 15 in April 2020, more recently it was 8.9 on 05/15/20. He is followed by endocrinology.  He had physical therapy for about 5 sessions and was given walking  poles.  He does not like to use them.  He does not have a walker.  He has chronic low back pain on the left side, without radiation to the leg.  He feels overall weaker in his feet.  He had transmetatarsal amputation of the right foot in 2017.  He had adapted to walking after surgery fairly well but in the past few months he has had balance issues and gait insecurity, recurrent falls.  He fell and hurt his right knee.  He has had right shoulder pain and has received injections with  cortisone twice into the right shoulder.  He has not seen orthopedics for his right knee.  He reports swelling and pain in the right knee.  He tries to hydrate well with water, drinks diet tea, several servings per day and drinks alcohol several servings per week in the form of beer, rare liquor.  He is a non-smoker.  He denies any stinging pain but has dull achy pain in the feet at night, not every night.  He does not notice any actual numbness.  He has a strong family history of diabetes and had an uncle with neuropathy as he recalls.    His Past Medical History Is Significant For: Past Medical History:  Diagnosis Date  . Anemia   . Chronic combined systolic and diastolic CHF (congestive heart failure) ([place])    a. 07/2015 Echo: EF 45-50%, mod LVH, mid apical/antsept AK, Gr 1 DD.  [name] CKD (chronic kidney disease), stage III ([place])   . Coronary artery disease    a. 2012 s/p MI/PCI: s/p DES to mid/dist RCA and overlapping DES to LAD;  b. 07/2015 Cath: LAD 10% ISR, D1 40(jailed), LCX 11m OM2 30, OM3 30, RCA 577mSR, 10d ISR; c. 07/2016 NSTEMI/PCI: LAD 85ost/p/m (3.5x20 Synergy DES overlapping prior stent), D1 40, LCX 7563mM2 95 (2.75x16 Synergy DES), OM3 30, RCA 61m74md.  . Depression   . Diabetic gastroparesis (HCC)[place]. DKA (diabetic ketoacidoses) 03/14/2018  . GERD (gastroesophageal reflux disease)   . Gout   . Heart murmur   . Hyperlipemia   . Hypertension   . Hypertensive heart disease   . Ischemic cardiomyopathy    a. 07/2015 Echo: EF 45-50%, mod LVH, mid-apicalanteroseptal AK, Gr1 DD.  . Morbid obesity (HCC)[place]. OSA on CPAP   . Osteomyelitis (HCC)[place] a. 07/2016 MTP joint of R great toe s/p transmetatarsal amputation.  . Polyneuropathy in diabetes(357.2)   . Pulmonary sarcoidosis (HCC)[place] "problems w/it years ago; no problems anymore" (07/03/2014)  . Rectal bleeding   . Sleep apnea   . Type II diabetes mellitus (HCC)[place]  His Past Surgical History Is Significant For: Past Surgical  History:  Procedure Laterality Date  . AMPUTATION Right 07/24/2016   Procedure: TRANSMETATARSAL FOOT AMPUTATION;  Surgeon: MarcNewt Minion;  [facility];  Laterality: Right;  . BALLOON DILATION N/A 03/21/2018   Procedure: BALLOON DILATION;  Surgeon: BuccRonald Lobo;  [facility];  Service: Endoscopy;  Laterality: N/A;  . BREAST LUMPECTOMY Right ~ 2000   "benign"  . CARDIAC CATHETERIZATION N/A 07/30/2015   Procedure: Left Heart Cath and Coronary Angiography;  Surgeon: ChriBurnell Blanks;  [facility];  Service: Cardiovascular;  Laterality: N/A;  . CARDIAC CATHETERIZATION N/A 07/19/2016   Procedure: Left Heart Cath and Coronary Angiography;  Surgeon: HenrBelva Crome;  [facility];  Service: Cardiovascular;  Laterality: N/A;  . CARDIAC CATHETERIZATION N/A 07/29/2016   Procedure: Coronary Stent Intervention;  Surgeon: Belva Crome, MD;  Location: [place] CV LAB;  Service: Cardiovascular;  Laterality: N/A;  . CARDIAC CATHETERIZATION N/A 07/29/2016   Procedure: Intravascular Ultrasound/IVUS;  Surgeon: Belva Crome, MD;  Location: [place] CV LAB;  Service: Cardiovascular;  Laterality: N/A;  . CATARACT EXTRACTION W/ INTRAOCULAR LENS  IMPLANT, BILATERAL Bilateral 2014-2015  . COLONOSCOPY WITH PROPOFOL N/A 08/22/2017   Procedure: COLONOSCOPY WITH PROPOFOL;  Surgeon: [name], MD;  Location: [place];  Service: Endoscopy;  Laterality: N/A;  . CORONARY ANGIOPLASTY WITH STENT PLACEMENT  10/31/2011   30 % MID ATRIOVENTRICULAR GROOVE CX STENOSIS. 90 % MID RCA.PTA TO LAD/STENTING OF TANDEM 60-70%. LAD STENOSIS 99% REDUCED TO 0%.3.0 X 32MM PROMUS DES POSTDILATED TO 3.25MM.  [name] CORONARY ANGIOPLASTY WITH STENT PLACEMENT  07/03/2014   "2"  . CORONARY STENT INTERVENTION N/A 04/09/2019   Procedure: CORONARY STENT INTERVENTION;  Surgeon: Jettie Booze, MD;  Location: [place] CV LAB;  Service:  Cardiovascular;  Laterality: N/A;  . CORONARY STENT INTERVENTION N/A 08/22/2019   Procedure: CORONARY STENT INTERVENTION;  Surgeon: Martinique, Peter M, MD;  Location: [place] CV LAB;  Service: Cardiovascular;  Laterality: N/A;  . ESOPHAGOGASTRODUODENOSCOPY (EGD) WITH PROPOFOL N/A 03/21/2018   Procedure: ESOPHAGOGASTRODUODENOSCOPY (EGD) WITH PROPOFOL;  Surgeon: [name], MD;  Location: [place];  Service: Endoscopy;  Laterality: N/A;  . I & D EXTREMITY Right 10/30/2013   Procedure: IRRIGATION AND DEBRIDEMENT RIGHT SMALL FINGER POSSIBLE SECONDARY WOUND CLOSURE;  Surgeon: [name], MD;  Location: [facility];  Service: Orthopedics;  Laterality: Right;  [name] is available after 4pm  . I & D EXTREMITY Right 11/01/2013   Procedure:  IRRIGATION AND DEBRIDEMENT RIGHT  PROXIMAL PHALANGEAL LEVEL AMPUTATION;  Surgeon: [name], MD;  Location: [facility];  Service: Orthopedics;  Laterality: Right;  . INCISION AND DRAINAGE Right 10/28/2013   Procedure: INCISION AND DRAINAGE Right small finger;  Surgeon: [name], MD;  [facility];  Service: Orthopedics;  Laterality: Right;  . LEFT HEART CATH Left 11/03/2011   Procedure: LEFT HEART CATH;  Surgeon: [name], MD;  Location[facility] CATH LAB;  Service: Cardiovascular;  Laterality: Left;  . LEFT HEART CATH AND CORONARY ANGIOGRAPHY N/A 04/09/2019   Procedure: LEFT HEART CATH AND CORONARY ANGIOGRAPHY;  Surgeon: Jettie Booze, MD;  Location: [place] CV LAB;  Service: Cardiovascular;  Laterality: N/A;  . LEFT HEART CATHETERIZATION WITH CORONARY ANGIOGRAM N/A 07/03/2014   Procedure: LEFT HEART CATHETERIZATION WITH CORONARY ANGIOGRAM;  Surgeon: [name], MD;  Location: [facility] CATH LAB;  Service: Cardiovascular;  Laterality: N/A;  . PERCUTANEOUS CORONARY STENT INTERVENTION (PCI-S) Left 11/03/2011   Procedure: PERCUTANEOUS CORONARY STENT INTERVENTION (PCI-S);  Surgeon: [name], MD;  Location: [facility] CATH LAB;  Service:  Cardiovascular;  Laterality: Left;  . RIGHT/LEFT HEART CATH AND CORONARY ANGIOGRAPHY N/A 08/22/2019   Procedure: RIGHT/LEFT HEART CATH AND CORONARY ANGIOGRAPHY;  Surgeon: Martinique, Peter M, MD;  Location: [place] CV LAB;  Service: Cardiovascular;  Laterality: N/A;  . TOE AMPUTATION Right ~ 2010   "2nd toe"  . TOE AMPUTATION Right 2012   "3rd toe"    His Family History Is Significant For: Family History  Problem Relation Age of Onset  . Heart attack Maternal Aunt   . Diabetes Maternal Grandmother   . Hypertension Maternal Grandmother  His Social History Is Significant For: Social History   Socioeconomic History  . Marital status: Married    Spouse name: Not on file  . Number of children: Not on file  . Years of education: Not on file  . Highest education level: Not on file  Occupational History  . Not on file  Tobacco Use  . Smoking status: Never Smoker  . Smokeless tobacco: Never Used  Vaping Use  . Vaping Use: Never used  Substance and Sexual Activity  . Alcohol use: Yes    Comment: social   . Drug use: No  . Sexual activity: Yes  Other Topics Concern  . Not on file  Social History Narrative  . Not on file   Social Determinants of Health   Financial Resource Strain: Not on file  Food Insecurity: Not on file  Transportation Needs: Not on file  Physical Activity: Not on file  Stress: Not on file  Social Connections: Not on file    His Allergies Are:  Allergies  Allergen Reactions  . Chlorthalidone Other (See Comments)    Causes dehydration, kidneys shut down  :   His Current Medications Are:  Outpatient Encounter Medications as of 12/16/2020  Medication Sig  . Accu-Chek FastClix Lancets MISC 1 each by Other route in the morning, at noon, in the evening, and at bedtime.   [name] ACCU-CHEK GUIDE test strip USE UP TO FOUR TIMES DAILY AS DIRECTED (Patient taking differently: 1 each by Other route See admin instructions. USE UP TO FOUR TIMES DAILY AS DIRECTED)   . acetaminophen (TYLENOL) 650 MG CR tablet Take 1,300 mg by mouth daily as needed for pain.  [name] allopurinol (ZYLOPRIM) 300 MG tablet Take 300 mg by mouth daily.   [name] amLODipine (NORVASC) 10 MG tablet Take 1 tablet (10 mg total) by mouth daily.  [name] aspirin EC 81 MG tablet Take 81 mg by mouth daily.   . blood glucose meter kit and supplies Dispense based on patient and insurance preference. Use up to four times daily as directed. (FOR ICD-10 E10.9, E11.9). (Patient taking differently: 1 each by Other route See admin instructions. Dispense based on patient and insurance preference. Use up to four times daily as directed. (FOR ICD-10 E10.9, E11.9).)  . furosemide (LASIX) 40 MG tablet Take 40 mg by mouth 2 (two) times daily.  . insulin glargine (LANTUS) 100 UNIT/ML injection Inject 0.2 mLs (20 Units total) into the skin 2 (two) times daily.  . metoprolol succinate (TOPROL-XL) 50 MG 24 hr tablet Take 0.5 tablets (25 mg total) by mouth daily. Take with or immediately following a meal. (Patient taking differently: Take 50 mg by mouth daily. Take with or immediately following a meal.)  . nitroGLYCERIN (NITROSTAT) 0.4 MG SL tablet Place 1 tablet (0.4 mg total) under the tongue every 5 (five) minutes as needed for chest pain.  [name] NOVOLOG FLEXPEN 100 UNIT/ML FlexPen Inject 5-8 Units into the skin 3 (three) times daily with meals.  . pantoprazole (PROTONIX) 40 MG tablet TAKE 1 TABLET BY MOUTH TWICE DAILY BEFORE MEAL(S) (Patient taking differently: Take 40 mg by mouth 2 (two) times daily before a meal.)  . potassium chloride (KLOR-CON) 20 MEQ packet Take 20 mEq by mouth daily.  . ticagrelor (BRILINTA) 90 MG TABS tablet Take 1 tablet (90 mg total) by mouth 2 (two) times daily.  [name] VICTOZA 18 MG/3ML SOPN INJECT 0.3 MLS (1.8MG TOTAL) INTO THE SKIN DAILY (Patient taking differently: Inject 1.8 mg into the skin  daily.)  . Vitamin D, Ergocalciferol, (DRISDOL) 1.25 MG (50000 UNIT) CAPS capsule Take 50,000 Units by mouth once  a week.  . [DISCONTINUED] ezetimibe (ZETIA) 10 MG tablet Take 1 tablet (10 mg total) by mouth daily.  . [DISCONTINUED] furosemide (LASIX) 80 MG tablet Take 1 tablet (80 mg total) by mouth daily.  . [DISCONTINUED] rosuvastatin (CRESTOR) 40 MG tablet Take 1 tablet by mouth once daily (Patient taking differently: Take 40 mg by mouth daily. )   No facility-administered encounter medications on file as of 12/16/2020.  :  Review of Systems:  Out of a complete 14 point review of systems, all are reviewed and negative with the exception of these symptoms as listed below: Review of Systems  Neurological:       Pt presents today to discuss his neuropathy and treatment options.    Objective:  Neurological Exam  Physical Exam Physical Examination:   Vitals:   12/16/20 0836  BP: (!) 179/90  Pulse: 84    General Examination: The patient is a very pleasant 65 y.o. male in no acute distress. He appears well-developed and well-nourished and well groomed.   HEENT: Normocephalic, atraumatic, pupils are equal, round and reactive to light, extraocular tracking is preserved, hearing is grossly intact, face is symmetric with normal facial animation and normal sensation to light touch, pinprick, temperature.  He has no dysarthria, airway examination reveals moderately crowded airway to significant crowding.  Tongue protrudes centrally and palate elevates symmetrically.  No dysarthria or hypophonia.  Mild to moderate mouth dryness noted.  Chest: Clear to auscultation without wheezing, rhonchi or crackles noted.  Heart: S1+S2+0, regular and normal without murmurs, rubs or gallops noted.   Abdomen: Soft, non-tender and non-distended with normal bowel sounds appreciated on auscultation.  Extremities: There is no pitting edema in the distal lower extremities bilaterally.   Skin: Warm and dry with multiple hyperpigmented spots and scratch marks throughout his visible skin and on the abdomen and chest.    Musculoskeletal: exam reveals right knee pain.  He reports left-sided low back pain without radiation. Hx of right forefoot amputation.  He reports some left knee pain today.  Neurologically:  Mental status: The patient is awake, alert and oriented in all 4 spheres. His immediate and remote memory, attention, language skills and fund of knowledge are appropriate. There is no evidence of aphasia, agnosia, apraxia or anomia. Speech is clear with normal prosody and enunciation. Thought process is linear. Mood is normal and affect is normal.  Cranial nerves II - XII are as described above under HEENT exam.  Motor exam: Normal bulk, strength and tone is noted, with the exception of foot dorsiflexion weakness bilaterally. There is no drift, tremor or rebound. Romberg is not tested due to safety concerns.  Reflexes are trace in the upper extremities and absent in the lower extremities. Fine motor skills and coordination: intact grossly in the upper and lower extremities.   Cerebellar testing: No dysmetria or intention tremor.  Sensory exam: intact to light touch.    Gait, station and balance: He stands with difficulty.  He stands slightly wide-based, posture is age-appropriate.  He walks with a limp and walks slightly wide-based.  He did not bring any cane. Tandem walk is not tested due to safety concerns.  Assessment and Plan:  In summary, JAMARRION BUDAI is a very pleasant 65 year old male with an underlying medical history of hypertension, type 2 diabetes, gout, right foot osteomyelitis with s/p amputation R forefoot in 07/2016,  coronary artery disease, chronic kidney disease, and associated anemia and hyper parathyroidism, CHF, OSA on CPAP, and morbid obesity with a BMI of over 65, who presents for follow-up consultation of his gait and balance problems, concern for neuropathy.  His history and examination are in keeping with neuropathy, EMG testing on 07/16/2020 was supportive with a significant  neuropathy.  He reports ongoing issues with low back pain.  He was advised to seek a referral through his primary care physician to see a spine specialist.  His insurance denied a lumbar spine MRI.  He reports leg pain and numbness.  He is advised to start low-dose gabapentin for symptomatic treatment.  He was recently hospitalized for acute on chronic kidney failure.  He is cautioned regarding increased doses of gabapentin but is advised that low-dose should be fine with his kidney function but he is encouraged to discuss this with his nephrologist at the next appointment as well.  We will start with 100 mg at bedtime, he is advised to follow-up to see one of our nurse practitioners in 3 months and we can consider cautiously increasing the dose to 200 mg at the time if need be.  He is advised to continue to work on weight loss and good blood sugar control, latest A1c in September 2021 was improved.  He is encouraged to call with any interim questions or concerns.  He was given written instructions and a new prescription for gabapentin today.  I answered all his questions today and he was in agreement.

## 2020-12-16 NOTE — Patient Instructions (Addendum)
For your peripheral neuropathy, I think it will be important on an ongoing basis that you have good diabetes control, good kidney function, and strive for weight loss.  Please try to hydrate well with water as per instructions from your kidney specialist.  We will try low-dose gabapentin, it should be fine to take in low dose with chronic kidney disease, we will start with 100 mg at bedtime. The most common side effects reported are sedation or sleepiness. Rare side effects include balance problems, confusion.   Please follow-up in 3 months to see one of our nurse practitioners.  If need be, we can try to increase the gabapentin very cautiously at the time.  As far as your low back pain, please talk to your primary care physician about a referral to a spine specialist as discussed before.

## 2020-12-18 ENCOUNTER — Encounter: Payer: Self-pay | Admitting: Physical Therapy

## 2020-12-18 ENCOUNTER — Ambulatory Visit: Payer: Medicare Other | Attending: Internal Medicine | Admitting: Physical Therapy

## 2020-12-18 ENCOUNTER — Other Ambulatory Visit: Payer: Self-pay

## 2020-12-18 DIAGNOSIS — M6281 Muscle weakness (generalized): Secondary | ICD-10-CM | POA: Diagnosis present

## 2020-12-18 DIAGNOSIS — E1165 Type 2 diabetes mellitus with hyperglycemia: Secondary | ICD-10-CM | POA: Insufficient documentation

## 2020-12-18 DIAGNOSIS — R296 Repeated falls: Secondary | ICD-10-CM

## 2020-12-18 DIAGNOSIS — R262 Difficulty in walking, not elsewhere classified: Secondary | ICD-10-CM | POA: Diagnosis present

## 2020-12-18 DIAGNOSIS — E118 Type 2 diabetes mellitus with unspecified complications: Secondary | ICD-10-CM | POA: Insufficient documentation

## 2020-12-18 DIAGNOSIS — R600 Localized edema: Secondary | ICD-10-CM | POA: Insufficient documentation

## 2020-12-18 NOTE — Therapy (Addendum)
[facility] [address] [place], [place], [address] Phone: [phone]   Fax:  [phone]  Physical Therapy Treatment/Re-Evaluation/Discharge  Patient Details  Name: Joshua [name] MRN: [id number] Date of Birth: 1956/03/30 Referring Provider (PT): Preethat Jospeh   Encounter Date: 12/18/2020    Past Medical History:  Diagnosis Date  . Anemia   . Chronic combined systolic and diastolic CHF (congestive heart failure) (HCC)    a. 07/2015 Echo: EF 45-50%, mod LVH, mid apical/antsept AK, Gr 1 DD.  [name] CKD (chronic kidney disease), stage III (HCC)   . Coronary artery disease    a. 2012 s/p MI/PCI: s/p DES to mid/dist RCA and overlapping DES to LAD;  b. 07/2015 Cath: LAD 10% ISR, D1 40(jailed), LCX 63m, OM2 30, OM3 30, RCA 68m ISR, 10d ISR; c. 07/2016 NSTEMI/PCI: LAD 85ost/p/m (3.5x20 Synergy DES overlapping prior stent), D1 40, LCX 9m, OM2 95 (2.75x16 Synergy DES), OM3 30, RCA 32m, 10d.  . Depression   . Diabetic gastroparesis (HCC)   . DKA (diabetic ketoacidoses) 03/14/2018  . GERD (gastroesophageal reflux disease)   . Gout   . Heart murmur   . Hyperlipemia   . Hypertension   . Hypertensive heart disease   . Ischemic cardiomyopathy    a. 07/2015 Echo: EF 45-50%, mod LVH, mid-apicalanteroseptal AK, Gr1 DD.  . Morbid obesity (HCC)   . OSA on CPAP   . Osteomyelitis (HCC)    a. 07/2016 MTP joint of R great toe s/p transmetatarsal amputation.  . Polyneuropathy in diabetes(357.2)   . Pulmonary sarcoidosis (HCC)    "problems w/it years ago; no problems anymore" (07/03/2014)  . Rectal bleeding   . Sleep apnea   . Type II diabetes mellitus (HCC)     Past Surgical History:  Procedure Laterality Date  . AMPUTATION Right 07/24/2016   Procedure: TRANSMETATARSAL FOOT AMPUTATION;  Surgeon: Nadara Mustard, MD;  Location: [facility] OR;  Service: Orthopedics;  Laterality: Right;  . BALLOON DILATION N/A 03/21/2018   Procedure: BALLOON DILATION;   Surgeon: Bernette Redbird, MD;  Location: [facility] ENDOSCOPY;  Service: Endoscopy;  Laterality: N/A;  . BREAST LUMPECTOMY Right ~ 2000   "benign"  . CARDIAC CATHETERIZATION N/A 07/30/2015   Procedure: Left Heart Cath and Coronary Angiography;  Surgeon: Kathleene Hazel, MD;  Location: [facility] INVASIVE CV LAB;  Service: Cardiovascular;  Laterality: N/A;  . CARDIAC CATHETERIZATION N/A 07/19/2016   Procedure: Left Heart Cath and Coronary Angiography;  Surgeon: Lyn Records, MD;  Location: [facility] INVASIVE CV LAB;  Service: Cardiovascular;  Laterality: N/A;  . CARDIAC CATHETERIZATION N/A 07/29/2016   Procedure: Coronary Stent Intervention;  Surgeon: Lyn Records, MD;  Location: [facility] INVASIVE CV LAB;  Service: Cardiovascular;  Laterality: N/A;  . CARDIAC CATHETERIZATION N/A 07/29/2016   Procedure: Intravascular Ultrasound/IVUS;  Surgeon: Lyn Records, MD;  Location: [facility] INVASIVE CV LAB;  Service: Cardiovascular;  Laterality: N/A;  . CATARACT EXTRACTION W/ INTRAOCULAR LENS  IMPLANT, BILATERAL Bilateral 2014-2015  . COLONOSCOPY WITH PROPOFOL N/A 08/22/2017   Procedure: COLONOSCOPY WITH PROPOFOL;  Surgeon: Charlott Rakes, MD;  Location: [facility] ENDOSCOPY;  Service: Endoscopy;  Laterality: N/A;  . CORONARY ANGIOPLASTY WITH STENT PLACEMENT  10/31/2011   30 % MID ATRIOVENTRICULAR GROOVE CX STENOSIS. 90 % MID RCA.PTA TO LAD/STENTING OF TANDEM 60-70%. LAD STENOSIS 99% REDUCED TO 0%.3.0 X PROMUS DES POSTDILATED TO 3.25MM.  [name] CORONARY ANGIOPLASTY WITH STENT PLACEMENT  07/03/2014   "2"  . CORONARY STENT INTERVENTION N/A 04/09/2019  Procedure: CORONARY STENT INTERVENTION;  Surgeon: Jettie Booze, MD;  Location: [place] CV LAB;  Service: Cardiovascular;  Laterality: N/A;  . CORONARY STENT INTERVENTION N/A 08/22/2019   Procedure: CORONARY STENT INTERVENTION;  Surgeon: Martinique, Peter M, MD;  Location: [place] CV LAB;  Service: Cardiovascular;  Laterality: N/A;  . ESOPHAGOGASTRODUODENOSCOPY (EGD) WITH PROPOFOL N/A  03/21/2018   Procedure: ESOPHAGOGASTRODUODENOSCOPY (EGD) WITH PROPOFOL;  Surgeon: [name], MD;  Location: [place];  Service: Endoscopy;  Laterality: N/A;  . I & D EXTREMITY Right 10/30/2013   Procedure: IRRIGATION AND DEBRIDEMENT RIGHT SMALL FINGER POSSIBLE SECONDARY WOUND CLOSURE;  Surgeon: [name], MD;  Location: [facility] ORS;  Service: Orthopedics;  Laterality: Right;  [name] is available after 4pm  . I & D EXTREMITY Right 11/01/2013   Procedure:  IRRIGATION AND DEBRIDEMENT RIGHT  PROXIMAL PHALANGEAL LEVEL AMPUTATION;  Surgeon: [name], MD;  Location: [facility] ORS;  Service: Orthopedics;  Laterality: Right;  . INCISION AND DRAINAGE Right 10/28/2013   Procedure: INCISION AND DRAINAGE Right small finger;  Surgeon: [name], MD;  Location: [facility] ORS;  Service: Orthopedics;  Laterality: Right;  . LEFT HEART CATH Left 11/03/2011   Procedure: LEFT HEART CATH;  Surgeon: [name], MD;  Location: [facility] CATH LAB;  Service: Cardiovascular;  Laterality: Left;  . LEFT HEART CATH AND CORONARY ANGIOGRAPHY N/A 04/09/2019   Procedure: LEFT HEART CATH AND CORONARY ANGIOGRAPHY;  Surgeon: Jettie Booze, MD;  Location: [place] CV LAB;  Service: Cardiovascular;  Laterality: N/A;  . LEFT HEART CATHETERIZATION WITH CORONARY ANGIOGRAM N/A 07/03/2014   Procedure: LEFT HEART CATHETERIZATION WITH CORONARY ANGIOGRAM;  Surgeon: [name], MD;  Location: [facility] CATH LAB;  Service: Cardiovascular;  Laterality: N/A;  . PERCUTANEOUS CORONARY STENT INTERVENTION (PCI-S) Left 11/03/2011   Procedure: PERCUTANEOUS CORONARY STENT INTERVENTION (PCI-S);  Surgeon: [name], MD;  Location: [facility] CATH LAB;  Service: Cardiovascular;  Laterality: Left;  . RIGHT/LEFT HEART CATH AND CORONARY ANGIOGRAPHY N/A 08/22/2019   Procedure: RIGHT/LEFT HEART CATH AND CORONARY ANGIOGRAPHY;  Surgeon: Martinique, Peter M, MD;  Location: [facility] CV LAB;  Service: Cardiovascular;  Laterality: N/A;  . TOE  AMPUTATION Right ~ 2010   "2nd toe"  . TOE AMPUTATION Right 2012   "3rd toe"    There were no vitals filed for this visit.                 PT Short Term Goals - 12/18/20 0933      PT SHORT TERM GOAL #1   Title Patient will increase bilateral LE strength to 5/5    Status Achieved      PT SHORT TERM GOAL #2   Title Patient will require close gaurd with tandem stance    Status Achieved      PT SHORT TERM GOAL #3   Title Patient will complete BERG balance scale and make an appropriate goal    Status Achieved             PT Long Term Goals - 12/18/20 0934      PT LONG TERM GOAL #1   Title Patient will decrease 5x sit to stand transfer time to < 20 seconds to demonstrat improved ability to transfer    Baseline 30 sec    Time 6    Period Weeks    Status Revised    Target Date 02/12/21      PT LONG TERM GOAL #2   Title Patient  will improve TUG to 13 sec for reduced fall risk.    Baseline 18 sec    Time 6    Period Weeks    Status On-going    Target Date 02/12/21      PT LONG TERM GOAL #3   Title Patient will report no falls for a 3 weeks period    Baseline 2 weeks ago    Time 6    Period Weeks    Status On-going      PT LONG TERM GOAL #4   Title Patient will safely ambulate 1500 feet with single point cane    Baseline needs cues to use cane, uses for short distances in the community    Time 6    Period Weeks    Status Revised    Target Date 02/12/21      PT LONG TERM GOAL #5   Title improve berg balance score to >/ 40/56 to improve balance and stability to maximize safety.    Baseline 28/56    Time 6    Period Weeks    Status On-going    Target Date 02/12/21                  Patient will benefit from skilled therapeutic intervention in order to improve the following deficits and impairments:  Cardiopulmonary status limiting activity,Pain,Decreased strength,Decreased mobility,Decreased balance,Decreased knowledge of  precautions,Impaired flexibility  Visit Diagnosis: Repeated falls - Plan: PT plan of care cert/re-cert  Muscle weakness (generalized) - Plan: PT plan of care cert/re-cert  Difficulty in walking, not elsewhere classified - Plan: PT plan of care cert/re-cert  Localized edema - Plan: PT plan of care cert/re-cert     Problem List Patient Active Problem List   Diagnosis Date Noted  . Hypokalemia 09/25/2020  . Type 2 diabetes mellitus with stage 3 chronic kidney disease ([place]) 09/24/2020  . Unstable angina ([place]) 08/17/2019  . Chest pain 08/16/2019  . NSTEMI (non-ST elevated myocardial infarction) ([place]) 04/06/2019  . ACS (acute coronary syndrome) ([place]) 04/06/2019  . Sepsis ([place]) 03/15/2018  . DKA (diabetic ketoacidoses) 03/15/2018  . Special screening for malignant neoplasms, colon 08/22/2017  . Acute renal failure superimposed on stage 3 chronic kidney disease ([place]) 05/19/2017  . Essential hypertension   . Status post transmetatarsal amputation of foot, right ([place]) 11/08/2016  . Diabetic polyneuropathy associated with type 2 diabetes mellitus ([place]) 11/08/2016  . Pure hypercholesterolemia   . AKI (acute kidney injury) ([place])   . Acute blood loss anemia   . DM type 2 with diabetic peripheral neuropathy ([place])   . Physical debility 08/03/2016  . Chronic osteomyelitis of right foot ([place])   . Cellulitis and abscess of leg, except foot   . Penetrating wound of right foot   . CKD (chronic kidney disease) stage 3, GFR 30-59 ml/min (HCC)   . Unstable angina pectoris ([place]) 07/18/2016  . Chronic combined systolic and diastolic congestive heart failure ([place])   . Chest pain with moderate risk for cardiac etiology 06/24/2014  . CAD S/P multiple PCIs 06/24/2014  . Sleep apnea- on C-pap 06/24/2014  . Morbid obesity due to excess calories ([place]) 11/10/2013  . Hyperglycemia 10/29/2013  . Hyperlipidemia 01/12/2011  . Benign essential HTN 01/12/2011  . GERD 01/12/2011    PAA,JENNIFER 12/22/2020,  11:11 AM  [name] [address] [place], [place], [address] Phone: [phone]   Fax:  [phone]  Name: MILAS SCHAPPELL MRN: [id number] Date of Birth: 1956-06-21  Raeford Razor, PT 02/17/21 3:40 PM Phone: [phone] Fax: [phone]

## 2020-12-23 ENCOUNTER — Ambulatory Visit: Payer: Medicare Other | Admitting: Dietician

## 2020-12-23 ENCOUNTER — Encounter: Payer: Self-pay | Admitting: Dietician

## 2020-12-23 DIAGNOSIS — E1165 Type 2 diabetes mellitus with hyperglycemia: Secondary | ICD-10-CM

## 2020-12-23 DIAGNOSIS — R296 Repeated falls: Secondary | ICD-10-CM | POA: Diagnosis not present

## 2020-12-23 DIAGNOSIS — IMO0002 Reserved for concepts with insufficient information to code with codable children: Secondary | ICD-10-CM

## 2020-12-23 NOTE — Patient Instructions (Addendum)
Continue to follow a low sodium diet. Meat portion the size of a deck of cards. Choose diet sprite or diet gingerale rather than diet coke most often.  Dark soda contains increased phosphorous which is not good with kidney issues. Limit beer to no more than 2 beers when you drink.  Alcohol can cause changes in how the kidney's function and can also contribute to dehydration. Meat portion the size of a deck of cards.    Please call if you have any questions!

## 2020-12-23 NOTE — Progress Notes (Addendum)
Medical Nutrition Therapy:  Appt start time: 2542 end time:  1645.  This visit was completed via telephone due to the COVID-19 pandemic.   I spoke with Joshua [name] and verified that I was speaking with the correct person with two patient identifiers (full name and date of birth).   I discussed the limitations related to this kind of visit and the patient is willing to proceed.  Assessment:  Primary concerns today: .  Patient is here today alone.  He was last seen by myself 09/15/2020  Patient states that he had neurological testing with severe neuropathy.  States that MD thinks he may have a pinched nerve coming from his back.  He has been going to PT which he feels helps.  Insurance will not cover a MRI currently but he would like the neuropathy evaluated more fully.  He notes concerns with balance.  He has resumed PT this month post covid in November 2021.  ARF 09/2020.  Medications were adjusted.  GFR increased to 19 on 09/28/2020 from 10.  +Proteinuria  Appetite improved and back to normal, continues to eat about 1 meal daily and 1 snack but portions are improved.  Medicare Part D.  He states that the Novolog pen will no longer be cored and he is to discus with MD. Checking his blood sugar 1 time daily 150-200 early afternoon prior to eating. He has had a couple of lows less than 60 due to taking his Novolog without eating or without each much or taking the insulin after his meal. He has had about 3 times of 350-400 and this was due to ice cream.  Drinking less alcohol as it is not tasting "right".  Reported an average of  2 drinks daily and does not drink some days per patient 12/23/2020.  History includes Type 2 diabetes, CHF, CKD4, gastroparesis, OSA with new mask (which he is unable to use). Medications include Lantus 20 units bid and Novolog about 5-8 units before meals A1C 7.5% 08/15/2020, 8.9% 05/15/20 decreased from 13.8% 01/28/2020.  Weight hx: 311 lbs 12/22/2020 per patient at  another MD 293 lbs 08/26/2020 297 lbs 07/21/2020 300 lbs 06/03/2020 309 lbs 05/15/2020 (increased due to fluid) 306 lbs 05/06/2020 312 lbs 04/10/2020 (not eating much, presumed fluid gain based on examination) 300 lbs3/25/2021 319 lbs 01/28/20 304 lbs 04/2019 323 lbs about 02/14/20 highest resent weight 195 lbs lowest adult weight freshman in college during increased football practice  Patient lives with his wife and his son.  His son is paying the mortgage and helps out a lot with finances.  His wife has mobility issus and recently lost her job.  He walks with a cane.  Shopping causes him increased physical pain.  He finds it difficult to meet his own nutritional needs as well as the preferences of his family and overall is not cooking. 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 He is on disability. Middle son has type 2 diabetes and recently diagnosed. States that his oldest son uses alcohol and they encourage each other in this habit.  Preferred Learning Style:   No preference indicated   Learning Readiness:   Contemplating  DIETARY INTAKE:  24-hr recall:  Chili (turkey/beef/bean), chicken salad, cheese 2-3 saltines with peanut butter and salted shelled peanuts He has not eaten anything yet today Beverages: water, , hot tea, diet soda (  coke), low fat milk, diet gatorade, mio, beer  Usual physical activity: walking  Progress Towards Goal(s):  In progress.   Nutritional Diagnosis:  NB-1.1 Food and nutrition-related knowledge deficit As related to balance of carbohydrate, protein, and fat.  As evidenced by diet hx and patient report.    Intervention:  Nutrition counseling/education related to eating habits to increase nutrition quality continued.  Recommended increasing his vegetable intake, continuing to drink water rather than diet soda.  Discussed recommendation of a regular meal  schedule.   Recommended taking his Novolog prior to his meal and eat sitting down in the kitchen or dining room if possible. Joshua [name] verbalized that he does not feel that he can be more consistent with his blood sugar management than he is.  Encouraged self care and as much consistency as he is able. He is his quarterback.   We discussed Dexcom as he is inconsistent with his blood glucose monitoring.  Plan: Be sure that you are drinking adequate hydration (water is a great choice, mio and diet (sprite) soda can be fine). Consistency as much as possible. 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 Find ways to add more vegetables Aim to take your Novolog before meals.  Teaching Method Utilized:  Auditory  Handouts mailed after this visit include:  Types of insulin and function  NKD National Kidney Diet placemat for a kidney friendly meal without dialysis  Barriers to learning/adherence to lifestyle change: finances, social, health, motivation  Demonstrated degree of understanding via:  Teach Back   Monitoring/Evaluation:  Dietary intake, exercise, and body weight with follow up in 3 months.[name]

## 2020-12-29 ENCOUNTER — Ambulatory Visit: Payer: Medicare Other | Admitting: Internal Medicine

## 2021-01-13 ENCOUNTER — Telehealth: Payer: Self-pay | Admitting: Internal Medicine

## 2021-01-13 DIAGNOSIS — E1165 Type 2 diabetes mellitus with hyperglycemia: Secondary | ICD-10-CM

## 2021-01-13 DIAGNOSIS — E1159 Type 2 diabetes mellitus with other circulatory complications: Secondary | ICD-10-CM

## 2021-01-13 NOTE — Telephone Encounter (Signed)
Patient called to advise that his insurance is no longer covering Lantus, but that he was told by pharmacy that [place] would be a covered medication.  RX can be sent to [facility]  on [facility]  Call back # [phone]

## 2021-01-15 MED ORDER — NOVOLOG FLEXPEN 100 UNIT/ML ~~LOC~~ SOPN: 5.0000 [IU] | PEN_INJECTOR | Freq: Three times a day (TID) | SUBCUTANEOUS | 2 refills | Status: DC

## 2021-01-15 MED ORDER — BASAGLAR KWIKPEN 100 UNIT/ML ~~LOC~~ SOPN: PEN_INJECTOR | SUBCUTANEOUS | 1 refills | Status: DC

## 2021-01-15 MED ORDER — INSULIN LISPRO 100 UNIT/ML ~~LOC~~ SOLN: SUBCUTANEOUS | 2 refills | Status: DC

## 2021-01-15 NOTE — Telephone Encounter (Signed)
We can definitely work with Basaglar-same dose.

## 2021-01-15 NOTE — Telephone Encounter (Signed)
They will likely cover Humalog-same doses. However, if he cannot get these today, he may need to swing by the office tomorrow morning so I can give him samples.

## 2021-01-15 NOTE — Telephone Encounter (Signed)
Pt called again regarding getting his medication. Pt states his blood sugars are at 437 and only has his victoza. I informed pt we are working on getting him an alternative for Lantus sent.  Pt also advised insurance won't cover his Novalog pen anymore so he will also need an alternative sent to the pharmacy because he is out of that too.   PHARMACY: Insurance claims handler on [facility]

## 2021-01-15 NOTE — Telephone Encounter (Addendum)
Called and requested pt call back if samples are needed until his Rx is ready at the pharmacy.   Rx for Humalog sent to preferred pharmacy.

## 2021-01-15 NOTE — Telephone Encounter (Signed)
Ok to send Rx for alternative?

## 2021-02-02 ENCOUNTER — Other Ambulatory Visit: Payer: Self-pay | Admitting: Cardiovascular Disease

## 2021-02-10 ENCOUNTER — Encounter: Payer: Self-pay | Admitting: Internal Medicine

## 2021-02-10 ENCOUNTER — Other Ambulatory Visit: Payer: Self-pay

## 2021-02-10 ENCOUNTER — Ambulatory Visit (INDEPENDENT_AMBULATORY_CARE_PROVIDER_SITE_OTHER): Payer: Medicare Other | Admitting: Internal Medicine

## 2021-02-10 VITALS — BP 138/88 | HR 87 | Ht 69.0 in | Wt 318.8 lb

## 2021-02-10 DIAGNOSIS — E1159 Type 2 diabetes mellitus with other circulatory complications: Secondary | ICD-10-CM

## 2021-02-10 DIAGNOSIS — E1165 Type 2 diabetes mellitus with hyperglycemia: Secondary | ICD-10-CM | POA: Diagnosis not present

## 2021-02-10 DIAGNOSIS — E785 Hyperlipidemia, unspecified: Secondary | ICD-10-CM

## 2021-02-10 DIAGNOSIS — E669 Obesity, unspecified: Secondary | ICD-10-CM | POA: Diagnosis not present

## 2021-02-10 LAB — POCT GLYCOSYLATED HEMOGLOBIN (HGB A1C): Hemoglobin A1C: 8.3 % — AB (ref 4.0–5.6)

## 2021-02-10 NOTE — Progress Notes (Signed)
[name] ID: Joshua [name], male   DOB: Dec 04, 1956, 65 y.o.   MRN: [id number]   This visit occurred during the SARS-CoV-2 public health emergency.  Safety protocols were in place, including screening questions prior to the visit, additional usage of staff PPE, and extensive cleaning of exam room while observing appropriate contact time as indicated for disinfecting solutions.   HPI: Joshua [name] is a 65 y.o.-year-old male, initially referred by his PCP, Dr. Alyson Ingles, returning for follow-up for DM2, dx in 1990s, insulin-dependent, uncontrolled, with complications (CAD - s/p AMI 2012, s/p PCIs, ischemic cardiomyopathy, CHF, CKD stage 4, PN - s/p R transmetatarsal amputation, gastroparesis, history of DKA). He saw Dr Dwyane Dee initially, then Dr. Chalmers Cater for 20 years, but she is not accepting Medicaid patients anymore so he had to switch providers.  Last visit with me 6 months ago.  At this visit, patient tells me that he relaxed his diet as his kidney function improved and he felt better.  Unfortunately, he gained 40 pounds since last visit.  Sugars are also very high.  Reviewed HbA1c levels: Lab Results  Component Value Date   HGBA1C 7.5 (A) 08/15/2020   HGBA1C 8.9 (A) 05/15/2020   HGBA1C 13.8 (A) 01/28/2020   HGBA1C 10.2 (H) 08/16/2019   HGBA1C 15.0 (H) 04/07/2019   HGBA1C >15.5 (H) 03/15/2018   HGBA1C 11.8 (H) 05/19/2017   HGBA1C 8.5 (H) 11/17/2016   HGBA1C 9.5 (H) 07/18/2016   HGBA1C 10.8 (H) 02/16/2016   He was on: - Lantus 20 units 2x a day - NovoLog 10 units 3x a day, before meals Prev. On 70/30 until Spring 2020.  Now on: - Lantus >> Basaglar 30 >> but actually taking 20-25 units a day 2x a day - NovoLog >> Humalog 10-15 units before meals (15 min before you eat) - Victoza 1.8 mg daily before breakfast - started 01/2020 Ozempic was not covered by his insurance  He checks sugars 0-2 times a day - ave 244 for 30 days. - am: 50, ave 130 >> 163, 290, 329 >> 84 >> n/c  - 2h  after b'fast: n/c - before lunch: 104-187, 219 >> 60, 90, 120, 150-190 (his am) >> 140-180 - 2h after lunch: n/c >> 274 >> n/c - before dinner: n/c >> 134, 217 >> 151 >> n/c - 2h after dinner: n/c >> 145 - bedtime: n/c >> 285 >> 213, 336 >> 353 >> 215- 500s - nighttime: n/c >> HI >> 73-215 >> 121 Lowest sugar was 50 >> 163 >> 73 >> 60 >> 60s (took insulin after he eats); he has hypoglycemia awareness at 100.  Highest sugar was 400 >> HI >> 336 >> 353 (no NovoLog) >> 538  Glucometer: ReliOn  Pt's meals are: - Breakfast: skips - Lunch: may skip, PB or chicken - Dinner: chicken w/o sides or with + veggies + starch;  - Snacks: occasional sweets: Hershey bar, sweet tea, cool aid He drinks diet sodas and water.  -+ CKD stage IV- Dr [name], last BUN/creatinine:  Lab Results  Component Value Date   BUN 51 (H) 09/28/2020   BUN 55 (H) 09/27/2020   CREATININE 3.24 (H) 09/28/2020   CREATININE 3.85 (H) 09/27/2020  10/18/2019: 24/2.92 10/18/2019: ACR 765.3  Lab Results  Component Value Date   GFRAA 27 (L) 08/23/2019   GFRAA 25 (L) 08/22/2019   GFRAA 24 (L) 08/21/2019   GFRAA 22 (L) 08/20/2019   GFRAA 29 (L) 08/19/2019  Not on ACE inhibitor/ARB  -+ HL;  last set of lipids: 09/05/2020: 112/137/38/49 10/18/2019: 143/105/53/69 Lab Results  Component Value Date   CHOL 168 08/17/2019   HDL 85 08/17/2019   LDLCALC 71 08/17/2019   TRIG 60 08/17/2019   CHOLHDL 2.0 08/17/2019  On Crestor 40, Zetia 10.  - last eye exam was in 11/2019: No DR. Had cataract sx in 2014, 2015.  -He has numbness in DVB his feet.  He also has significant back pain.  He sees neurology for peripheral neuropathy, leg pain, instability.  He was started on gabapentin since last visit.   On ASA 81.  Latest TSH: 06/09/2020: TSH 3.36  Pt has FH of DM in [place].  He also has a history of HTN, OSA, GERD, esophageal ulcers.  No h/o pancreatitis or FH of MTC.  He is in a play in [place].  ROS: Constitutional: +  weight gain/no weight loss, no fatigue, no subjective hyperthermia, no subjective hypothermia Eyes: no blurry vision, no xerophthalmia ENT: no sore throat, no nodules palpated in neck, no dysphagia, no odynophagia, no hoarseness Cardiovascular: no CP/+ SOB/no palpitations/+leg swelling Respiratory: no cough/+ SOB/no wheezing Gastrointestinal: no N/no V/no D/no C/+ acid reflux Musculoskeletal: no muscle aches/+ joint aches Skin: no rashes, no hair loss Neurological: no tremors/+ numbness/+ tingling/no dizziness  I reviewed pt's medications, allergies, PMH, social hx, family hx, and changes were documented in the history of present illness. Otherwise, unchanged from my initial visit note.  Past Medical History:  Diagnosis Date  . Anemia   . Chronic combined systolic and diastolic CHF (congestive heart failure) ([place])    a. 07/2015 Echo: EF 45-50%, mod LVH, mid apical/antsept AK, Gr 1 DD.  [name] CKD (chronic kidney disease), stage III ([place])   . Coronary artery disease    a. 2012 s/p MI/PCI: s/p DES to mid/dist RCA and overlapping DES to LAD;  b. 07/2015 Cath: LAD 10% ISR, D1 40(jailed), LCX 51m OM2 30, OM3 30, RCA 52mSR, 10d ISR; c. 07/2016 NSTEMI/PCI: LAD 85ost/p/m (3.5x20 Synergy DES overlapping prior stent), D1 40, LCX 756mM2 95 (2.75x16 Synergy DES), OM3 30, RCA 78m93md.  . Depression   . Diabetic gastroparesis (HCC)[place]. DKA (diabetic ketoacidoses) 03/14/2018  . GERD (gastroesophageal reflux disease)   . Gout   . Heart murmur   . Hyperlipemia   . Hypertension   . Hypertensive heart disease   . Ischemic cardiomyopathy    a. 07/2015 Echo: EF 45-50%, mod LVH, mid-apicalanteroseptal AK, Gr1 DD.  . Morbid obesity (HCC)[place]. OSA on CPAP   . Osteomyelitis (HCC)[place] a. 07/2016 MTP joint of R great toe s/p transmetatarsal amputation.  . Polyneuropathy in diabetes(357.2)   . Pulmonary sarcoidosis (HCC)[place] "problems w/it years ago; no problems anymore" (07/03/2014)  . Rectal bleeding   . Sleep  apnea   . Type II diabetes mellitus (HCC)[place] Past Surgical History:  Procedure Laterality Date  . AMPUTATION Right 07/24/2016   Procedure: TRANSMETATARSAL FOOT AMPUTATION;  Surgeon: MarcNewt Minion;  [facility] [place];  Laterality: Right;  . BALLOON DILATION N/A 03/21/2018   Procedure: BALLOON DILATION;  Surgeon: BuccRonald Lobo;  [facility];  Service: Endoscopy;  Laterality: N/A;  . BREAST LUMPECTOMY Right ~ 2000   "benign"  . CARDIAC CATHETERIZATION N/A 07/30/2015   Procedure: Left Heart Cath and Coronary Angiography;  Surgeon: ChriBurnell Blanks;  [facility];  Service: Cardiovascular;  Laterality: N/A;  . CARDIAC CATHETERIZATION N/A 07/19/2016   Procedure: Left Heart Cath and Coronary Angiography;  Surgeon: Belva Crome, MD;  Location: [place] CV LAB;  Service: Cardiovascular;  Laterality: N/A;  . CARDIAC CATHETERIZATION N/A 07/29/2016   Procedure: Coronary Stent Intervention;  Surgeon: Belva Crome, MD;  Location: [place] CV LAB;  Service: Cardiovascular;  Laterality: N/A;  . CARDIAC CATHETERIZATION N/A 07/29/2016   Procedure: Intravascular Ultrasound/IVUS;  Surgeon: Belva Crome, MD;  Location: [place] CV LAB;  Service: Cardiovascular;  Laterality: N/A;  . CATARACT EXTRACTION W/ INTRAOCULAR LENS  IMPLANT, BILATERAL Bilateral 2014-2015  . COLONOSCOPY WITH PROPOFOL N/A 08/22/2017   Procedure: COLONOSCOPY WITH PROPOFOL;  Surgeon: [name], MD;  Location: [place];  Service: Endoscopy;  Laterality: N/A;  . CORONARY ANGIOPLASTY WITH STENT PLACEMENT  10/31/2011   30 % MID ATRIOVENTRICULAR GROOVE CX STENOSIS. 90 % MID RCA.PTA TO LAD/STENTING OF TANDEM 60-70%. LAD STENOSIS 99% REDUCED TO 0%.3.0 X 32MM PROMUS DES POSTDILATED TO 3.25MM.  [name] CORONARY ANGIOPLASTY WITH STENT PLACEMENT  07/03/2014   "2"  . CORONARY STENT INTERVENTION N/A 04/09/2019   Procedure: CORONARY STENT INTERVENTION;  Surgeon: Jettie Booze, MD;  Location: [place] CV LAB;  Service: Cardiovascular;  Laterality: N/A;  . CORONARY STENT INTERVENTION N/A 08/22/2019   Procedure: CORONARY STENT INTERVENTION;  Surgeon: Martinique, Peter M, MD;  Location: [place] CV LAB;  Service: Cardiovascular;  Laterality: N/A;  . ESOPHAGOGASTRODUODENOSCOPY (EGD) WITH PROPOFOL N/A 03/21/2018   Procedure: ESOPHAGOGASTRODUODENOSCOPY (EGD) WITH PROPOFOL;  Surgeon: [name], MD;  Location: [place];  Service: Endoscopy;  Laterality: N/A;  . I & D EXTREMITY Right 10/30/2013   Procedure: IRRIGATION AND DEBRIDEMENT RIGHT SMALL FINGER POSSIBLE SECONDARY WOUND CLOSURE;  Surgeon: [name], MD;  Location: [facility];  Service: Orthopedics;  Laterality: Right;  [name] is available after 4pm  . I & D EXTREMITY Right 11/01/2013   Procedure:  IRRIGATION AND DEBRIDEMENT RIGHT  PROXIMAL PHALANGEAL LEVEL AMPUTATION;  Surgeon: [name], MD;  Location: [facility];  Service: Orthopedics;  Laterality: Right;  . INCISION AND DRAINAGE Right 10/28/2013   Procedure: INCISION AND DRAINAGE Right small finger;  Surgeon: [name], MD;  [facility];  Service: Orthopedics;  Laterality: Right;  . LEFT HEART CATH Left 11/03/2011   Procedure: LEFT HEART CATH;  Surgeon: [name], MD;  [facility];  Service: Cardiovascular;  Laterality: Left;  . LEFT HEART CATH AND CORONARY ANGIOGRAPHY N/A 04/09/2019   Procedure: LEFT HEART CATH AND CORONARY ANGIOGRAPHY;  Surgeon: Jettie Booze, MD;  Location: [place] CV LAB;  Service: Cardiovascular;  Laterality: N/A;  . LEFT HEART CATHETERIZATION WITH CORONARY ANGIOGRAM N/A 07/03/2014   Procedure: LEFT HEART CATHETERIZATION WITH CORONARY ANGIOGRAM;  Surgeon: [name], MD;  Location: [facility] CATH [facility];  Service: Cardiovascular;  Laterality: N/A;  . PERCUTANEOUS CORONARY STENT INTERVENTION (PCI-S) Left 11/03/2011   Procedure: PERCUTANEOUS CORONARY STENT INTERVENTION (PCI-S);  Surgeon: [name], MD;  Location[facility] CATH LAB;  Service: Cardiovascular;  Laterality: Left;  . RIGHT/LEFT HEART CATH AND CORONARY ANGIOGRAPHY N/A 08/22/2019   Procedure: RIGHT/LEFT HEART CATH AND CORONARY ANGIOGRAPHY;  Surgeon: Martinique, Peter M, MD;  Location: [place] CV LAB;  Service: Cardiovascular;  Laterality: N/A;  . TOE AMPUTATION Right ~ 2010   "2nd toe"  . TOE AMPUTATION Right 2012   "3rd toe"   Social History   Socioeconomic History  . Marital status:  Married    Spouse name: Not on file  . Number of children: Not on file  . Years of education: Not on file  . Highest education level: Not on file  Occupational History  . Not on file  Tobacco Use  . Smoking status: Never Smoker  . Smokeless tobacco: Never Used  Vaping Use  . Vaping Use: Never used  Substance and Sexual Activity  . Alcohol use: Yes    Comment: social   . Drug use: No  . Sexual activity: Yes  Other Topics Concern  . Not on file  Social History Narrative  . Not on file   Social Determinants of Health   Financial Resource Strain: Not on file  Food Insecurity: Not on file  Transportation Needs: Not on file  Physical Activity: Not on file  Stress: Not on file  Social Connections: Not on file  Intimate Partner Violence: Not on file   Current Outpatient Medications on File Prior to Visit  Medication Sig Dispense Refill  . Accu-Chek FastClix Lancets MISC 1 each by Other route in the morning, at noon, in the evening, and at bedtime.     [name] ACCU-CHEK GUIDE test strip USE UP TO FOUR TIMES DAILY AS DIRECTED (Patient taking differently: 1 each by Other route See admin instructions. USE UP TO FOUR TIMES DAILY AS DIRECTED) 300 each 3  . acetaminophen (TYLENOL) 650 MG CR tablet Take 1,300 mg by mouth daily as needed for pain.    [name] allopurinol (ZYLOPRIM) 300 MG tablet Take 300 mg by mouth daily.     [name] amLODipine (NORVASC) 10 MG tablet Take 1 tablet by mouth once daily 90 tablet 3  . aspirin EC 81 MG tablet Take 81 mg by  mouth daily.     . blood glucose meter kit and supplies Dispense based on patient and insurance preference. Use up to four times daily as directed. (FOR ICD-10 E10.9, E11.9). (Patient taking differently: 1 each by Other route See admin instructions. Dispense based on patient and insurance preference. Use up to four times daily as directed. (FOR ICD-10 E10.9, E11.9).) 1 each 0  . furosemide (LASIX) 40 MG tablet Take 40 mg by mouth 2 (two) times daily.    [name] gabapentin (NEURONTIN) 100 MG capsule Take 1 capsule (100 mg total) by mouth at bedtime. 30 capsule 5  . Insulin Glargine (BASAGLAR KWIKPEN) 100 UNIT/ML Inject 20 units into the skin 2 (two) times daily 12 mL 1  . insulin lispro (HUMALOG) 100 UNIT/ML injection Inject 5-8 Units into the skin 3 (three) times daily with meals 9 mL 2  . metoprolol succinate (TOPROL-XL) 50 MG 24 hr tablet Take 0.5 tablets (25 mg total) by mouth daily. Take with or immediately following a meal. (Patient taking differently: Take 50 mg by mouth daily. Take with or immediately following a meal.)  0  . nitroGLYCERIN (NITROSTAT) 0.4 MG SL tablet Place 1 tablet (0.4 mg total) under the tongue every 5 (five) minutes as needed for chest pain. 25 tablet 1  . pantoprazole (PROTONIX) 40 MG tablet TAKE 1 TABLET BY MOUTH TWICE DAILY BEFORE MEAL(S) (Patient taking differently: Take 40 mg by mouth 2 (two) times daily before a meal.) 180 tablet 0  . potassium chloride (KLOR-CON) 20 MEQ packet Take 20 mEq by mouth daily.    . ticagrelor (BRILINTA) 90 MG TABS tablet Take 1 tablet (90 mg total) by mouth 2 (two) times daily. 180 tablet 3  . VICTOZA 18 MG/3ML SOPN INJECT  0.3 MLS (1.8MG TOTAL) INTO THE SKIN DAILY (Patient taking differently: Inject 1.8 mg into the skin daily.) 9 mL 0  . Vitamin D, Ergocalciferol, (DRISDOL) 1.25 MG (50000 UNIT) CAPS capsule Take 50,000 Units by mouth once a week. (Patient not taking: Reported on 12/23/2020)     No current facility-administered medications on file  prior to visit.   Allergies  Allergen Reactions  . Chlorthalidone Other (See Comments)    Causes dehydration, kidneys shut down   Family History  Problem Relation Age of Onset  . Heart attack Maternal Aunt   . Diabetes Maternal Grandmother   . Hypertension Maternal Grandmother     PE: BP 138/88   Pulse 87   Ht _0  (1.753 m)   Wt (!) 318 lb 12.8 oz (144.6 kg)   SpO2 97%   BMI 47.08 kg/m  Wt Readings from Last 3 Encounters:  02/10/21 (!) 318 lb 12.8 oz (144.6 kg)  12/23/20 (!) 311 lb (141.1 kg)  12/16/20 (!) 311 lb (141.1 kg)   Constitutional: overweight, in NAD Eyes: PERRLA, EOMI, no exophthalmos ENT: moist mucous membranes, no thyromegaly, no cervical lymphadenopathy Cardiovascular: RRR, No MRG, + LE edema Respiratory: CTA B Gastrointestinal: abdomen soft, NT, ND, BS+ Musculoskeletal: + Deformities: Right transmetatarsal amputation, strength intact in all 4 Skin: moist, warm, no rashes Neurological: no tremor with outstretched hands, DTR normal in all 4  ASSESSMENT: 1. DM2, insulin-dependent, uncontrolled, with complications - CAD - s/p AMI 2012, s/p multiple PCI's; ischemic cardiomyopathy; CHF. Dr. Claiborne Billings - CKD stage 4 - PN - s/p R transmetatarsal amputation - Gastroparesis - History of DKA  PLAN:  1. Patient with longstanding, uncontrolled, type 2 diabetes, on basal-bolus insulin regimen and daily GLP-1 receptor agonist with improved control over the last several months, from an HbA1c of 13.8% to 8.9%, and, at last visit, 7.5% after he started to improve his diet.  He was still eating takeout and was skipping meals at last visit but was working on improving this.  We did try to change from Victoza to [place] but this was not covered for him in the past.  I tried to resend this to his pharmacy at last visit.  Also, he could not obtain a CGM in the past, and I sent a new prescription to his pharmacy at last visit. -At today's visit, he tells me that he is not on  Ozempic and did not get the CGM.  He is now eating better as his kidney function has improved but he is feeling better, not nauseated.  Unfortunately, he gained a significant amount of weight and likely is insulin resistant crease.  Sugars are quite high, with few exceptions in the 120s and 140s.  Upon questioning, he is a little lower dose of Basaglar than recommended so we will increase the dose back to 30 units twice a day.  He is taking slightly more Humalog than before and I advised him to continue the higher doses.  For now, we will continue Victoza. -We discussed about the importance of having a more structured meal regimen.  He is resistant to this change, however. - I suggested to:  Patient Instructions  Please increase: - Basaglar 30 units a day 2x a day  Please continue: - Humalog 10-20 15 minutes before meals - Victoza 1.8 mg daily before breakfast.  Try to have a more structured meal schedule.  Please come back for a follow-up appointment in 3-4 months.  - we checked his HbA1c: 8.3% (higher) -  advised to check sugars at different times of the day - 4x a day, rotating check times - advised for yearly eye exams >> he is not UTD-advised to schedule - return to clinic in 3-4 months  2. HL -Reviewed latest lipid panel from 09/05/2020: 112/137/38/49 -Continues Crestor 40 and Zetia 10 without side effects  3.  Obesity class III -She lost 9 pounds before last visit -He usually has large fluctuations in his weight possibly also due to fluid status -He was not able to start Ozempic due to price.  Continues Victoza, which should also help with weight loss -He saw nutrition before last visit.  He started to improve his diet afterwards -Since last visit, gabapentin was added for peripheral neuropathy.  This is weight inducing -He gained approximately 40 pounds since last visit! -Discussed about improving his meal schedule, but he is not very open to this idea  Philemon Kingdom, MD  PhD [facility] Endocrinology

## 2021-02-10 NOTE — Patient Instructions (Addendum)
Please increase: - Basaglar 30 units a day 2x a day  Please continue: - Humalog 10-20 15 minutes before meals - Victoza 1.8 mg daily before breakfast.  Try to have a more structured meal schedule.  Please come back for a follow-up appointment in 3-4 months.

## 2021-02-23 ENCOUNTER — Other Ambulatory Visit: Payer: Self-pay

## 2021-02-23 ENCOUNTER — Encounter: Payer: Self-pay | Admitting: Cardiovascular Disease

## 2021-02-23 ENCOUNTER — Ambulatory Visit (INDEPENDENT_AMBULATORY_CARE_PROVIDER_SITE_OTHER): Payer: Medicare Other | Admitting: Cardiovascular Disease

## 2021-02-23 VITALS — BP 160/84 | HR 91 | Ht 69.0 in | Wt 312.0 lb

## 2021-02-23 DIAGNOSIS — I25119 Atherosclerotic heart disease of native coronary artery with unspecified angina pectoris: Secondary | ICD-10-CM

## 2021-02-23 DIAGNOSIS — G4733 Obstructive sleep apnea (adult) (pediatric): Secondary | ICD-10-CM | POA: Diagnosis not present

## 2021-02-23 DIAGNOSIS — I5042 Chronic combined systolic (congestive) and diastolic (congestive) heart failure: Secondary | ICD-10-CM | POA: Diagnosis not present

## 2021-02-23 DIAGNOSIS — I2510 Atherosclerotic heart disease of native coronary artery without angina pectoris: Secondary | ICD-10-CM

## 2021-02-23 DIAGNOSIS — N184 Chronic kidney disease, stage 4 (severe): Secondary | ICD-10-CM

## 2021-02-23 DIAGNOSIS — Z9989 Dependence on other enabling machines and devices: Secondary | ICD-10-CM

## 2021-02-23 DIAGNOSIS — Z9861 Coronary angioplasty status: Secondary | ICD-10-CM | POA: Diagnosis not present

## 2021-02-23 DIAGNOSIS — E118 Type 2 diabetes mellitus with unspecified complications: Secondary | ICD-10-CM

## 2021-02-23 MED ORDER — METOPROLOL SUCCINATE ER 25 MG PO TB24: 75.0000 mg | ORAL_TABLET | Freq: Every day | ORAL | 3 refills | Status: DC

## 2021-02-23 NOTE — Patient Instructions (Addendum)
Medication Instructions:   stop taking Metoprolol 50 mg tablet  Increase to 75 mg  ( 3 tablets of 25 mg tablet) daily   *If you need a refill on your cardiac medications before your next appointment, please call your pharmacy*   Lab Work: Not needed    Testing/Procedures:  Not needed  Follow-Up: At [facility], you and your health needs are our priority.  As part of our continuing mission to provide you with exceptional heart care, we have created designated Provider Care Teams.  These Care Teams include your primary Cardiologist (physician) and Advanced Practice Providers (APPs -  Physician Assistants and Nurse Practitioners) who all work together to provide you with the care you need, when you need it.     Your next appointment:   6 month(s)  The format for your next appointment:   In Person  Provider:   [name], MD

## 2021-02-23 NOTE — Progress Notes (Unsigned)
Cardiology Office Note    Date:  02/25/2021   ID:  Joshua [name], DOB 1956-04-28, MRN [id number]  PCP:  [name], MD  Cardiologist:  [name], MD   12 month F/U  History of Present Illness:  Joshua [name] is a 65 y.o. male who suffered an acute coronary syndrome on 10/31/2011 and presented with ECG findings of anterolateral ST segment elevation. Cardiac catheterization revealed a 99% mid LAD stenosis and proximal to this there was a 60-70% stenosis after the first diagonal vessel. He also had 90-95% mid RCA stenosis and 30% circumflex stenosis. He underwent initial acute intervention to his LAD and a 3.0x32 mm Promus DES stent was inserted. 3 days later he underwent successful staged PCI to his 95% RCA stenosis and a 3.0x18 mm Resolute DES stent was inserted and post dilated 3.5 mm.   Additional problems include a >35 year history of hypertension, a 25 year history of diabetes mellitus, obesity, as well as hyperlipidemia.  He was admitted to the hospital on 07/29/2015 with complaints of shortness of breath similar to his initial symptomatology. He had mild renal insufficiency, was hydrated and underwent cardiac catheterization by Dr. Julianne Handler in 07/30/2015. His LAD stents were patent. There was mild 40% first agonal stenosis, 60% circumflex stenosis, and his RCA stents had a 50% intimal narrowing in its mid stent without significant narrowing in the distal stent. An echo Doppler study on 07/31/2015 showed an EF of 45-50% with moderate left ventricular hypertrophy and grade 1 diastolic dysfunction. He was seen by Jorja Loa on 08/08/2015 for initial posthospital evaluation. Since that time, he has had some vague symptoms of GERD. He is not on any proton pump inhibitor. He gets shortness of breath with minimal activity including brushing his teeth and taking a shower. He has not been routinely exercising. He has obstructive sleep apnea and uses CPAP therapy. He denies  residual snoring. He works at the [employer] in the night shift. At times he feels that some of his food may get stuck when swallowing but this is not consistent.   When I saw him in September 2016, I started him on Protonix for significant GERD symptoms which has improved. We discussed weight loss and exercise. Laboratory showed worsening renal function with a creatinine of 1.84, significant glucose elevation at 669 with probable factitious hyponatremia at 129, due to high glucose levels. His hemoglobin A1c was 14.2. I referred him to Dr. Michiel Sites for endocrinologic evaluation. I also discontinued his spironolactone and added hydralazine 25 mg twice a day for more optimal blood pressure control in light of his renal insufficiency. We discussed the importance of weight loss.  When I saw him in the office in March 2017, he has been in a cast and boot in his right leg due to prior infection of his right toe. He did undergo hyperbaric chamber therapy. He tells me he was initially told of having borderline osteomyelitis. Four weeks ago his cultures were negative. He subsequently required right metatarsal amputation for osteomyelitis in August 2017. In August he was admitted to [facility] with unstable angina and ruled in for a non-STEM>Repeat catheterization showed new eccentric proximal LAD stenosis, not involving the previously placed overlapping mid LAD stents, eccentric 75% mid left circumflex stenosis between OM1 and OM 2, and progression of OM1 stenosis to 95%. He ultimately underwent stenting to his LAD and circumflex marginal vessel with Synergy stents. He was readmitted in October 2017 with recurrent chest pain  which was felt to be atypical and musculoskeletal. A 2 day protocol nuclear stress test showed an EF of 39% without ischemia. He was seen in office follow-up by Almyra Deforest, [place] on 10/22/2016.   When I saw him in December 2017, he denied any recurrent episodes of  chest pain. His sleep was poor and he felt tired. Over 10 years ago he was evaluated for sleep apnea but had only used CPAP for 6 months. I referred him for a sleep study which was done on 01/07/2017. Revealed severe obstructive sleep apnea with an AHI of 41.6/h. He had severe oxygen desaturation to a nadir of 77%. He had increased periodic limb movements with an index of 25.15. On the diagnostic portion of the study there was absence of REM sleep. During the CPAP titration there was evidence for REM rebound, such that he slept 32.6% in REM sleep.   When I  saw him in 2018, he was on CPAP for 6 weeks and felt significantly improved. I obtained a download of his CPAP unit today from 03/07/2017 through 04/05/2017. He is 100% compliant with usage stays and usage greater than 4 hours and is averaging 8 hours and 4 minutes of sleep per night. He is set at a 17 cm pressure his AHI is excellent at 1.5. He does have a moderate leak. He uses a fullface mask. He denies any chest pain. He denies palpitations. He had noticed recently that his blood pressure is elevated.   When I saw him in June 2018, he was able to get a new job. 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 His work is from 7 PM until 7 AM. Typically he would go home and get to sleep between 8:39 AM. Depending upon if he had to pick his son up, he would either sleep until 5 PM or if he had to get his son up would have to wake up in the early afternoon. He denies exertionally precipitated chest pain but has noticed some sharp fleeting atypical sensations. There was confusion with his medications and he had run out of his metoprolol succinate which he was taking 150 mg daily and nitrates A new download was obtained in the office today from July 15 through 07/25/2017. He did have machine malfunction contribute to reduce compliance. However, at a 17 cm pressure. AHI was excellent at 0.4. An Epworth Sleepiness Scale  score was recalculated and this endorsed at 7.  Since September 2018 he was using CPAP more consistently and a download from September 25 through 10/05/2017 showed 83%. Usage stays. However, usage greater than 4 hours was only 67%. He was averaging 5 hours and 36 minutes. At 17 cm set pressure. AHI was excellent at 0.8. He did have a moderate mask leak. He had not use CPAP for the last 3 weeks due to significant sinus congestion. He works from 7 PM to 7 AM. He typically goes to bed at 8 AM until 4 PM. He has noticed mild leg swelling. He also has had issues with diabetes. He was seen in the emergency room on 11/21/2017 with right-sided flank pain. When he presented with GI symptoms and vomiting. He denies any anginal symptomatology. He has not been successful with weight loss.   Since the last office visit, patient was admitted with DKA and severe dehydration in the setting of uncontrolled type II DM in April 2019. On arrival his creatinine was 5.25, CBG of 1241, aniongap was over 35. Hemoglobin A1c was  greater than 15.5. On further discussion, he apparently lost his job and was not taking the insulin for more than 2-1/2 weeks.  He was seen by Almyra Deforest, PAC in December 2019, and was complaining of exertional chest discomfort for 2 months. Therefore we recommended outpatient cardiac cath however his renal function was down and creatinine came back 2.2. Cardiac catheterization was delayed. I stopped his diuretic at the repeated the lab work, creatinine still came back around 2.1. I discussed the case with Dr. Claiborne Billings and we eventually decided to use Lexiscan stress test to risk stratify. I also increase his Imdur to 120 mg twice daily for antianginal purposes. I increase his hydralazine to 3 times a day for better control. Myoview was obtained on 12/26/2018 which showed EF 33%, medium sized moderate intensity severe fixed septal and anteroseptal perfusion defect which could represent  scar or artifact, no significant reversible ischemia noted. Overall this was a high risk study due to decreased LVEF which has been reduced from the previous study in 2017.  Patient presents today for cardiology office visit with Isaac Laud and was have some chest tightness recently, especially with swallowing. He says he ran out of his pantoprazole that usually control as reflux. I am okay to give him 83-month supply of Protonix, however if he needs more he will need to discuss with his PCP. He also ran out of  Imdur 2 days ago as well, who refilled this medication. Otherwise, because he has some discomfort with swallowing, he has not been eating very well therefore has lost significant amount of weight. He also says he has been urinating more on the same dose of diuretic, I will defer the management of his renal function to his nephrologist. As far as his blood sugar, he says some days his blood sugar would be down to 70s, whereas other days, his blood sugar is quite high. Even on the previous lab work in December, his glucose ranges between 63-561. This is being managed by her primary care provider as well.   He had done relatively well until recentlybut apparently due to increasing recurrent episodes of nocturnal chest pain he was advised  to present to the hospital and was admitted on April 06, 2019.  He ruled in for non-STEMI. Creatinine was 2.01 on admission.  He underwent cardiac catheterization on April 09, 2019 and was found to have 90% in-stent restenosis of his proximal LAD stent.  His catheterization findings are detailed below in the CV studies section of this note.  He was discharged the following day and at discharge creatinine was 2.01, similar to admission.  I saw him for telemedicine evaluation on April 12, 2019 and since hospital discharge, he has felt well and denies any of his previous anginal symptomatology.  He denied PND orthopnea. He had not been using his CPAP due to the  sensation that he felt the pressures were blowing too high.  Remotely he was found to have severe sleep apnea which markedly normalized with his set 17 cm pressure.  However, over the last month a download reveals only 1 day of use for only 3.31 hours.  Since I last saw him, adjustments were made to lower his CPAP pressure and he therefore began using therapy.  He developed recurrent chest pain symptomatology in early September 2020 and repeat catheterization demonstrated 95% proximal LAD lesion treated with a 3.5 x 20 mm Synergy DES stent.  There was 50% mid left circumflex residual narrowing and 40% ostial diagonal 1  stenosis.  He has continued to be followed by [name] of nephrology.  His diuretic was increased to Lasix 80 mg daily.  I last saw him in December 2020 at which time he was without  recurrent anginal symptomatology since his September 2020 intervention.  He does have some difficulty walking since his right transmetatarsal amputation in 2017 and has a prosthesis in the foot making walking harder.  I obtained a download of his CPAP unit today which shows he is meeting compliance with usage at 90%.  However, usage is only 5 hours and 33 minutes per night.  At his reduced set pressure of only 9 cm, AHI was now increased at 15.1.  During that evaluation I changed him to a CPAP auto mode with a minimum pressure of 10 to a maximum of 20.  I reduced his ramp time to 10 minutes and increased his ramp start pressure to 6 cm.  Gust the importance of weight loss with his morbid obesity.  I last saw him in March 2021 and over the 3 months previously he continued to be without anginal symptomatology.   He is now seeing Dr. Beverly Sessions for his diabetes mellitus has a follow-up appointment later this month.  He is followed by Dr. Madelon Lips for his renal disease and had previously been told to double his furosemide 80 mg for 2 days with recurrent leg swelling.  Will be seeing her for follow-up  laboratory.  He denies recent bleeding.  He does have some gait instability with his metatarsal amputation.  He continues to be on  rosuvastatin 40 mg and Zetia 10 mg for hyperlipidemia.  He continues to be on DAPT aspirin/Brilinta for CAD.  With reference to his sleep apnea, a download  from February 15 through March 16 only showed 1 day of use and he admits that he has not used treatment over the past month.  Download from December 28 through February 07, 2020 showed usage at 5 hours and 25 minutes but he was not meeting compliance with only 39 of 60 days of use and usage greater than 4 hours at only 43%.  AHI was 11.7.  He was having significant mask leak.   Since I last saw him, he has been followed closely by Dr. Madelon Lips with reference to his renal disease.  He admits to occasional left ankle and feet swelling.  He had lost significant weight but subsequently this has been regained.  He was seen by Roby Lofts, PA-C in September 2021.  Presently, he denies any chest pain.[name]  He was recently seen by Dr. Rexene Alberts for neuropathy.  He is followed by [name] for diabetes.  He is without anginal symptoms.  He has been using CPAP.  A download from February 9 through February 19, 2021 shows that he is meeting compliance with 93% of days used.  Average use was 6 hours and 8 minutes.  He does have significant mask leak and AHI is 6.7 with a 95th percentile average pressure at 12.2 and maximum average pressure 13.2.  He had received a new mask and his leak did somewhat improve.  Past Medical History:  Diagnosis Date  . Anemia   . Chronic combined systolic and diastolic CHF (congestive heart failure) ([place])    a. 07/2015 Echo: EF 45-50%, mod LVH, mid apical/antsept AK, Gr 1 DD.  [name] CKD (chronic kidney disease), stage III ([place])   . Coronary artery disease    a. 2012 s/p MI/PCI: s/p  DES to mid/dist RCA and overlapping DES to LAD;  b. 07/2015 Cath: LAD 10% ISR, D1 40(jailed), LCX 2m OM2 30, OM3 30, RCA 541mISR, 10d ISR; c. 07/2016 NSTEMI/PCI: LAD 85ost/p/m (3.5x20 Synergy DES overlapping prior stent), D1 40, LCX 7527mM2 95 (2.75x16 Synergy DES), OM3 30, RCA 56m23md.  . Depression   . Diabetic gastroparesis (HCC)[place] [address]. DKA (diabetic ketoacidoses) 03/14/2018  . GERD (gastroesophageal reflux disease)   . Gout   . Heart murmur   . Hyperlipemia   . Hypertension   . Hypertensive heart disease   . Ischemic cardiomyopathy    a. 07/2015 Echo: EF 45-50%, mod LVH, mid-apicalanteroseptal AK, Gr1 DD.  . Morbid obesity (HCC)[place]. OSA on CPAP   . Osteomyelitis (HCC)[place] a. 07/2016 MTP joint of R great toe s/p transmetatarsal amputation.  . Polyneuropathy in diabetes(357.2)   . Pulmonary sarcoidosis (HCC)[place] "problems w/it years ago; no problems anymore" (07/03/2014)  . Rectal bleeding   . Sleep apnea   . Type II diabetes mellitus (HCC)[place]  Past Surgical History:  Procedure Laterality Date  . AMPUTATION Right 07/24/2016   Procedure: TRANSMETATARSAL FOOT AMPUTATION;  Surgeon: MarcNewt Minion;  [facility];  Laterality: Right;  . BALLOON DILATION N/A 03/21/2018   Procedure: BALLOON DILATION;  Surgeon: BuccRonald Lobo;  [facility];  Service: Endoscopy;  Laterality: N/A;  . BREAST LUMPECTOMY Right ~ 2000   "benign"  . CARDIAC CATHETERIZATION N/A 07/30/2015   Procedure: Left Heart Cath and Coronary Angiography;  Surgeon: ChriBurnell Blanks;  [facility];  Service: Cardiovascular;  Laterality: N/A;  . CARDIAC CATHETERIZATION N/A 07/19/2016   Procedure: Left Heart Cath and Coronary Angiography;  Surgeon: HenrBelva Crome;  [facility];  Service: Cardiovascular;  Laterality: N/A;  . CARDIAC CATHETERIZATION N/A 07/29/2016   Procedure: Coronary Stent Intervention;  Surgeon: HenrBelva Crome;  [facility];  Service: Cardiovascular;  Laterality: N/A;  . CARDIAC CATHETERIZATION N/A 07/29/2016   Procedure:  Intravascular Ultrasound/IVUS;  Surgeon: HenrBelva Crome;  [facility];  Service: Cardiovascular;  Laterality: N/A;  . CATARACT EXTRACTION W/ INTRAOCULAR LENS  IMPLANT, BILATERAL Bilateral 2014-2015  . COLONOSCOPY WITH PROPOFOL N/A 08/22/2017   Procedure: COLONOSCOPY WITH PROPOFOL;  Surgeon: SchoWilford Corner;  [facility]: Endoscopy;  Laterality: N/A;  . CORONARY ANGIOPLASTY WITH STENT PLACEMENT  10/31/2011   30 % MID ATRIOVENTRICULAR GROOVE CX STENOSIS. 90 % MID RCA.PTA TO LAD/STENTING OF TANDEM 60-70%. LAD STENOSIS 99% REDUCED TO 0%.3.0 X 32MM PROMUS DES POSTDILATED TO 3.25MM.  . COMarland KitchenONARY ANGIOPLASTY WITH STENT PLACEMENT  07/03/2014   "2"  . CORONARY STENT INTERVENTION N/A 04/09/2019   Procedure: CORONARY STENT INTERVENTION;  Surgeon: VaraJettie Booze;  [facility];  Service: Cardiovascular;  Laterality: N/A;  . CORONARY STENT INTERVENTION N/A 08/22/2019   Procedure: CORONARY STENT INTERVENTION;  Surgeon: JordMartiniqueter M, MD;  [facility];  Service: Cardiovascular;  Laterality: N/A;  . ESOPHAGOGASTRODUODENOSCOPY (EGD) WITH PROPOFOL N/A 03/21/2018   Procedure: ESOPHAGOGASTRODUODENOSCOPY (EGD) WITH PROPOFOL;  Surgeon: BuccRonald Lobo;  [facility];  Laterality: N/A;  . I & D EXTREMITY Right 10/30/2013   Procedure: IRRIGATION AND DEBRIDEMENT RIGHT SMALL FINGER POSSIBLE SECONDARY WOUND CLOSURE;  Surgeon: MattSchuyler Amor;  Location: [facility];  Service: Orthopedics;  Laterality: Right;  [name] is available after 4pm  . I & D EXTREMITY Right 11/01/2013   Procedure:  IRRIGATION AND DEBRIDEMENT RIGHT  PROXIMAL PHALANGEAL LEVEL AMPUTATION;  Surgeon: [name], MD;  Location: [facility];  Service: Orthopedics;  Laterality: Right;  . INCISION AND DRAINAGE Right 10/28/2013   Procedure: INCISION AND DRAINAGE Right small finger;  Surgeon: [name], MD;  Location: [facility];   Service: Orthopedics;  Laterality: Right;  . LEFT HEART CATH Left 11/03/2011   Procedure: LEFT HEART CATH;  Surgeon: [name], MD;  Location: [facility] CATH LAB;  Service: Cardiovascular;  Laterality: Left;  . LEFT HEART CATH AND CORONARY ANGIOGRAPHY N/A 04/09/2019   Procedure: LEFT HEART CATH AND CORONARY ANGIOGRAPHY;  Surgeon: Jettie Booze, MD;  Location: [place] CV LAB;  Service: Cardiovascular;  Laterality: N/A;  . LEFT HEART CATHETERIZATION WITH CORONARY ANGIOGRAM N/A 07/03/2014   Procedure: LEFT HEART CATHETERIZATION WITH CORONARY ANGIOGRAM;  Surgeon: [name], MD;  Location: [facility] LAB;  Service: Cardiovascular;  Laterality: N/A;  . PERCUTANEOUS CORONARY STENT INTERVENTION (PCI-S) Left 11/03/2011   Procedure: PERCUTANEOUS CORONARY STENT INTERVENTION (PCI-S);  Surgeon: [name], MD;  Location: [facility] CATH LAB;  Service: Cardiovascular;  Laterality: Left;  . RIGHT/LEFT HEART CATH AND CORONARY ANGIOGRAPHY N/A 08/22/2019   Procedure: RIGHT/LEFT HEART CATH AND CORONARY ANGIOGRAPHY;  Surgeon: Martinique, Peter M, MD;  Location: [place] CV LAB;  Service: Cardiovascular;  Laterality: N/A;  . TOE AMPUTATION Right ~ 2010   "2nd toe"  . TOE AMPUTATION Right 2012   "3rd toe"    Current Medications: Outpatient Medications Prior to Visit  Medication Sig Dispense Refill  . Accu-Chek FastClix Lancets MISC 1 each by Other route in the morning, at noon, in the evening, and at bedtime.     [name] ACCU-CHEK GUIDE test strip USE UP TO FOUR TIMES DAILY AS DIRECTED (Patient taking differently: 1 each by Other route See admin instructions. USE UP TO FOUR TIMES DAILY AS DIRECTED) 300 each 3  . acetaminophen (TYLENOL) 650 MG CR tablet Take 1,300 mg by mouth daily as needed for pain.    [name] allopurinol (ZYLOPRIM) 300 MG tablet Take 300 mg by mouth daily.     [name] amLODipine (NORVASC) 10 MG tablet Take 1 tablet by mouth once daily 90 tablet 3  . aspirin EC 81 MG tablet Take 81 mg by mouth daily.     .  blood glucose meter kit and supplies Dispense based on patient and insurance preference. Use up to four times daily as directed. (FOR ICD-10 E10.9, E11.9). (Patient taking differently: 1 each by Other route See admin instructions. Dispense based on patient and insurance preference. Use up to four times daily as directed. (FOR ICD-10 E10.9, E11.9).) 1 each 0  . famotidine (PEPCID) 20 MG tablet Take 20 mg by mouth at bedtime.    . furosemide (LASIX) 40 MG tablet Take 40 mg by mouth 2 (two) times daily.    [name] gabapentin (NEURONTIN) 100 MG capsule Take 1 capsule (100 mg total) by mouth at bedtime. 30 capsule 5  . Insulin Glargine (BASAGLAR KWIKPEN) 100 UNIT/ML Inject 30 Units into the skin 2 (two) times daily.    . nitroGLYCERIN (NITROSTAT) 0.4 MG SL tablet Place 1 tablet (0.4 mg total) under the tongue every 5 (five) minutes as needed for chest pain. 25 tablet 1  . pantoprazole (PROTONIX) 40 MG tablet TAKE 1 TABLET  BY MOUTH TWICE DAILY BEFORE MEAL(S) 180 tablet 0  . ticagrelor (BRILINTA) 90 MG TABS tablet Take 1 tablet (90 mg total) by mouth 2 (two) times daily. 180 tablet 3  . VICTOZA 18 MG/3ML SOPN INJECT 0.3 MLS (1.$RemoveBe'8MG'xMmPQreof$  TOTAL) INTO THE SKIN DAILY 9 mL 0  . Insulin Glargine (BASAGLAR KWIKPEN) 100 UNIT/ML Inject 20 units into the skin 2 (two) times daily 12 mL 1  . tretinoin (RETIN-A) 0.1 % cream Apply topically daily.    . insulin lispro (HUMALOG) 100 UNIT/ML injection Inject 5-8 Units into the skin 3 (three) times daily with meals 9 mL 2  . metoprolol succinate (TOPROL-XL) 50 MG 24 hr tablet Take 0.5 tablets (25 mg total) by mouth daily. Take with or immediately following a meal. (Patient taking differently: Take 50 mg by mouth daily. Take with or immediately following a meal.)  0   No facility-administered medications prior to visit.     Allergies:   Chlorthalidone   Social History   Socioeconomic History  . Marital status: Married    Spouse name: Not on file  . Number of children: Not on file   . Years of education: Not on file  . Highest education level: Not on file  Occupational History  . Not on file  Tobacco Use  . Smoking status: Never Smoker  . Smokeless tobacco: Never Used  Vaping Use  . Vaping Use: Never used  Substance and Sexual Activity  . Alcohol use: Yes    Comment: social   . Drug use: No  . Sexual activity: Yes  Other Topics Concern  . Not on file  Social History Narrative  . Not on file   Social Determinants of Health   Financial Resource Strain: Not on file  Food Insecurity: Not on file  Transportation Needs: Not on file  Physical Activity: Not on file  Stress: Not on file  Social Connections: Not on file    Previously had worked at [employer]  Family History:  The patient's family history includes Diabetes in his maternal grandmother; Heart attack in his maternal aunt; Hypertension in his maternal grandmother.   ROS General: Negative; No fevers, chills, or night sweats;  HEENT: Negative; No changes in vision or hearing, sinus congestion, difficulty swallowing Pulmonary: Negative; No cough, wheezing, shortness of breath, hemoptysis Cardiovascular: Negative; No chest pain, presyncope, syncope, palpitations GI: Negative; No nausea, vomiting, diarrhea, or abdominal pain GU: Negative; No dysuria, hematuria, or difficulty voiding Musculoskeletal: Negative; no myalgias, joint pain, or weakness Hematologic/Oncology: Negative; no easy bruising, bleeding Endocrine: Negative; no heat/cold intolerance; no diabetes Neuro: Negative; no changes in balance, headaches Skin: Negative; No rashes or skin lesions Psychiatric: Negative; No behavioral problems, depression Sleep: Positive for OSA on CPAP therapy;  no bruxism, restless legs, hypnogognic hallucinations, no cataplexy Other comprehensive 14 point system review is negative.   PHYSICAL EXAM:   VS:  BP (!) 160/84 (BP Location: Left Arm, Patient Position: Sitting)   Pulse 91   Ht 5'  9" (1.753 m)   Wt (!) 312 lb (141.5 kg)   SpO2 98%   BMI 46.07 kg/m     Repeat blood pressure by me was 144/86 with his pulse in the 90s  Wt Readings from Last 3 Encounters:  02/23/21 (!) 312 lb (141.5 kg)  02/10/21 (!) 318 lb 12.8 oz (144.6 kg)  12/23/20 (!) 311 lb (141.1 kg)    General: Alert, oriented, no distress.  Morbidly obese with BMI at 46.1 Skin:  normal turgor, no rashes, warm and dry HEENT: Normocephalic, atraumatic. Pupils equal round and reactive to light; sclera anicteric; extraocular muscles intact;  Nose without nasal septal hypertrophy Mouth/Parynx benign; Mallinpatti scale 4 Neck: No JVD, no carotid bruits; normal carotid upstroke Lungs: clear to ausculatation and percussion; no wheezing or rales Chest wall: without tenderness to palpitation Heart: PMI not displaced, RRR, s1 s2 normal, 1/6 systolic murmur, no diastolic murmur, no rubs, gallops, thrills, or heaves Abdomen: soft, nontender; no hepatosplenomehaly, BS+; abdominal aorta nontender and not dilated by palpation. Back: no CVA tenderness Pulses 2+ Musculoskeletal: full range of motion, normal strength, no joint deformities Extremities: no clubbing cyanosis or edema, Homan's sign negative  Neurologic: grossly nonfocal; Cranial nerves grossly wnl Psychologic: Normal mood and affect   Studies/Labs Reviewed:   EKG:  EKG is ordered today.  ECG (independently read by me): NSR at 90; PR interval 202 msec; QS V1-2  February 27, 2020 ECG (independently read by me): Normal sinus rhythm at 70 bpm, first-degree AV block with a PR interval 216 ms.  Possible left atrial enlargement.  No ST segment changes.  December 2020 ECG (independently read by me): Normal sinus rhythm at 85 bpm.  Borderline first-degree AV block with  PR interval 202 ms.  Incomplete left bundle branch block.  No ectopy.  Recent Labs: BMP Latest Ref Rng & Units 09/28/2020 09/27/2020 09/26/2020  Glucose 70 - 99 mg/dL 253(H) 205(H) 189(H)  BUN 8 -  23 mg/dL 51(H) 55(H) 68(H)  Creatinine 0.61 - 1.24 mg/dL 3.24(H) 3.85(H) 4.98(H)  BUN/Creat Ratio 10 - 24 - - -  Sodium 135 - 145 mmol/L 136 139 136  Potassium 3.5 - 5.1 mmol/L 3.8 3.6 2.7(LL)  Chloride 98 - 111 mmol/L 104 106 99  CO2 22 - 32 mmol/L _0 Calcium 8.9 - 10.3 mg/dL 9.1 8.9 8.7(L)     Hepatic Function Latest Ref Rng & Units 09/26/2020 09/24/2020 08/17/2019  Total Protein 6.5 - 8.1 g/dL 6.5 6.9 -  Albumin 3.5 - 5.0 g/dL 3.1(L) 3.3(L) -  AST 15 - 41 U/L 205(H) 202(H) -  ALT 0 - 44 U/L 207(H) 227(H) 17  Alk Phosphatase 38 - 126 U/L 141(H) 172(H) -  Total Bilirubin 0.3 - 1.2 mg/dL 0.6 0.9 0.5  Bilirubin, Direct 0.1 - 0.5 mg/dL - - -    CBC Latest Ref Rng & Units 09/28/2020 09/25/2020 09/24/2020  WBC 4.0 - 10.5 K/uL 6.3 8.2 9.3  Hemoglobin 13.0 - 17.0 g/dL 11.8(L) 11.8(L) 12.5(L)  Hematocrit 39.0 - 52.0 % 36.9(L) 37.7(L) 38.6(L)  Platelets 150 - 400 K/uL 159 167 193   Lab Results  Component Value Date   MCV 89.3 09/28/2020   MCV 88.5 09/25/2020   MCV 88.5 09/24/2020   Lab Results  Component Value Date   TSH 3.360 06/09/2020   Lab Results  Component Value Date   HGBA1C 8.3 (A) 02/10/2021     BNP    Component Value Date/Time   BNP 81.1 08/16/2019 2133    ProBNP No results found for: PROBNP   Lipid Panel     Component Value Date/Time   CHOL 168 08/17/2019 0402   TRIG 60 08/17/2019 0402   HDL 85 08/17/2019 0402   CHOLHDL 2.0 08/17/2019 0402   VLDL 12 08/17/2019 0402   LDLCALC 71 08/17/2019 0402     RADIOLOGY: No results found.   Additional studies/ records that were reviewed today include:   CARDIAC CATH 04/09/2019  Previously placed Mid LAD drug  eluting stent is widely patent.  Ost 1st Diag to 1st Diag lesion is 40% stenosed.  Dist LAD lesion is 10% stenosed.  Previously placed 2nd Mrg drug eluting stent is widely patent.  3rd Mrg lesion is 30% stenosed.  Mid Cx lesion is 50% stenosed.  RPDA lesion is 30% stenosed.  Prox  RCA to Mid RCA lesion is 40% stenosed.  Mid RCA lesion is 25% stenosed.  LV end diastolic pressure is normal.  There is no aortic valve stenosis.  Ost LAD to Prox LAD lesion is 90% stenosed. In stent restenosis.  Scoring balloon angioplasty was performed using a BALLOON WOLVERINE 3.75X10, followed by 4.0 Robbins balloon.  Prox LAD lesion is 90% stenosed.  Post intervention, there is a 0% residual stenosis.  Continue IV fluids post procedure. He will need aggressive secondary prevention.   I stressed the importance of taking Brilinta 2x/day. He admits to occasionally missing the second dose due to cost. He states Plavix was stopped in the past, but he is not sure of the specifics.             ASSESSMENT:    1. Coronary artery disease involving native coronary artery of native heart with angina pectoris ([place])   2. Chronic combined systolic and diastolic congestive heart failure ([place])   3. OSA on CPAP   4. CAD S/P percutaneous coronary angioplasty   5. Stage 4 chronic kidney disease ([place])   6. Diabetes mellitus with complication [facility])     PLAN:  1.  CAD: Status post multiple PCI's with initial symptomatology in the setting of acute coronary syndrome November 2012.  In September 2020 he underwent his most recent intervention.  Presently he is without recurrent anginal symptomatology on amlodipine 10 mg, in addition to metoprolol 50 mg.  With his resting pulse in the 90s and blood pressure elevation I have recommended titration of metoprolol succinate to 75 mg daily.  I have recommended lifelong DAPT and he continues to be on aspirin and ticagrelor.  2.  Essential hypertension: Blood pressure today was elevated despite taking amlodipine 10 mg, furosemide 40 mg twice a day in addition to metoprolol succinate 50 mg daily.  As above, with his pulse of 91 and blood pressure elevation metoprolol succinate will be increased to 75 mg daily.  3.  Obstructive sleep apnea: Past  month, [name] essentially stopped using his CPAP due to complaints with his mask not fitting properly.  Since I last saw him, his compliance has significantly improved.  A download was obtained today from February 9 through February 19, 2021.  Usage days was 93% usage greater than 4 hours 87%.  He was averaging 6 hours and 8 minutes of use per night.  I have recommended optimal CPAP use being at least 7 to 8 hours.  His AHI was slightly elevated at 6.7 most likely due to mask leak.  He apparently received a new mask and leak has slightly improved.  I recommended that he put the mask on  and start the CPAP immediately prior to to getting into bed so that as soon as he is in bed CPAP will be on this will improve his sleep initiation.  4.  Hyperlipidemia: When last seen he was on Zetia 10 mg in addition to rosuvastatin 40 mg with target LDL less than 70.  Laboratory in September 2021 showed an LDL of 49 total cholesterol 112 triglycerides 137 and HDL 38.  For some reason these medicines were not on  his med list today.  We will need to verify if he is continuing on treatment  5. .Stage 4 chronic kidney disease: Patient is followed by Dr. Madelon Lips.  Creatinine on December 20, 2019 was 2.6 with a BUN of 36.  Creatinine in October 2021 had increased to 5.57 with subsequent improvement  6.  Diabetes mellitus with complication: History of poorly controlled diabetes with prior history of DKA.  Currently on insulin.  He is now seeing Dr. Beverly Sessions for endocrinology.  I reviewed her most recent office assessment from February 10, 2021.  He is now on Victoza in addition to Humalog and Basaglar insulin.  Ozempic was not covered by his insurance.  7.  Right transmetatarsal amputation: This has resulted in some imbalance to his gait with walking.  He had received a prosthesis for shoe.  8.  Morbid obesity: BMI 46.07.  He had lost over 45 pounds following hospitalization but ultimately gained the weight back.  We  discussed the importance of weight loss.  I again discussed optimal heart healthy diet being a Mediterranean diet as well as a plant-based diet.    I will see him in 6 months for follow-up Cardiologic evaluation.   Medication Adjustments/Labs and Tests Ordered: Current medicines are reviewed at length with the patient today.  Concerns regarding medicines are outlined above.  Medication changes, Labs and Tests ordered today are listed in the Patient Instructions below. Patient Instructions  Medication Instructions:   stop taking Metoprolol 50 mg tablet  Increase to 75 mg  ( 3 tablets of 25 mg tablet) daily   *If you need a refill on your cardiac medications before your next appointment, please call your pharmacy*   Lab Work: Not needed    Testing/Procedures:  Not needed  Follow-Up: At [facility], you and your health needs are our priority.  As part of our continuing mission to provide you with exceptional heart care, we have created designated Provider Care Teams.  These Care Teams include your primary Cardiologist (physician) and Advanced Practice Providers (APPs -  Physician Assistants and Nurse Practitioners) who all work together to provide you with the care you need, when you need it.     Your next appointment:   6 month(s)  The format for your next appointment:   In Person  Provider:   [name], MD        Signed, [name], MD  02/25/2021 7:11 PM    [facility] [address] [place], [place], [place]  [address] Phone: [phone]

## 2021-02-25 ENCOUNTER — Encounter: Payer: Self-pay | Admitting: Cardiovascular Disease

## 2021-02-26 ENCOUNTER — Other Ambulatory Visit: Payer: Self-pay | Admitting: Internal Medicine

## 2021-02-26 DIAGNOSIS — E1159 Type 2 diabetes mellitus with other circulatory complications: Secondary | ICD-10-CM

## 2021-02-26 DIAGNOSIS — E1165 Type 2 diabetes mellitus with hyperglycemia: Secondary | ICD-10-CM

## 2021-02-26 DIAGNOSIS — E785 Hyperlipidemia, unspecified: Secondary | ICD-10-CM

## 2021-02-26 DIAGNOSIS — E669 Obesity, unspecified: Secondary | ICD-10-CM

## 2021-03-02 ENCOUNTER — Other Ambulatory Visit: Payer: Self-pay

## 2021-03-02 ENCOUNTER — Encounter: Payer: Self-pay | Admitting: Dietician

## 2021-03-02 ENCOUNTER — Encounter: Payer: Medicare Other | Attending: Internal Medicine | Admitting: Dietician

## 2021-03-02 DIAGNOSIS — E1142 Type 2 diabetes mellitus with diabetic polyneuropathy: Secondary | ICD-10-CM | POA: Diagnosis not present

## 2021-03-02 NOTE — Progress Notes (Signed)
Medical Nutrition Therapy:  Appt start time: 0539 end time:  1645.  Assessment:  Primary concerns today: .  Patient is here today alone.  He was last seen by myself 12/23/2020  Patient states that stress has remained high and he has increased drinking >/= 4 beers many days. Checking his blood glucose 3-4 times per week around 2:00 pm "better than I was". Low blood sugar early this am.  Took insulin before dinner (low carb meal) and drank beer. Meals very inconsistent, often only once daily and therefore meal time insulin only once daily.  He is craving more fruit (watermelon, pineapple, etc) Sleep is poor quality, "not deep", only about 5 hours per night.  C-pap is working better. Complains of fluid retention more in his legs, ankles and feet. Complains of memory concerns.  Remembers theater lines well but difficulty remembering how to get to a location.  He is getting little exercise due to increased SOB, foot and back pain.  History includes Type 2 diabetes, CHF, CKD4, gastroparesis, OSA with new mask (which he is unable to use). Medications include Basaglar 30 units bid and Humalog about 10-20 units before meals, Victoza A1C 8.3% 02/10/2021,  7.5% 08/15/2020, 8.9% 05/15/20 decreased from 13.8% 01/28/2020. 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  His son is paying the mortgage and helps out a lot with finances.  His wife has mobility issus and recently lost her job.  Shopping causes  him increased physical pain.  He finds it difficult to meet his own nutritional needs as well as the preferences of his family and overall is not cooking.  States that his son's now frequently get take out. 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 Middle son has type 2 diabetes and recently diagnosed.  Preferred Learning Style:   No preference indicated   Learning Readiness:   Contemplating  DIETARY INTAKE:  24-hr recall:  Chili (turkey/beef/bean), chicken salad, cheese 2-3 saltines with peanut butter and salted shelled peanuts He has not eaten anything yet today Beverages: water, , hot tea, diet soda (coke), low fat milk, diet gatorade, mio, beer  Usual physical activity: walking  Progress Towards Goal(s):  In progress.   Nutritional Diagnosis:  NB-1.1 Food and nutrition-related knowledge deficit As related to balance of carbohydrate, protein, and fat.  As evidenced by diet hx and patient report.    Intervention:  Nutrition counseling/education continued.  Discussed recommendation of a regular meal schedule.   Recommended taking his Novolog prior to his meal .  Discussed the that when he does not eat a meal, he does not take the meal time insulin and  misses the opportunity to correct any high glucose readings. Mr. Standish verbalized that he does not feel that he can be more consistent with his blood sugar management than he is.  Encouraged self care and as much consistency as he is able. He is his quarterback.   Recommended reducing sodium intake, improving nutrition balance to get adequate nutrients.  Plan: Stress control Limit beer to 2 per night or choose not to drink Avoid added salt  Be sure that you are drinking adequate hydration (water is a great choice, mio and diet (sprite) soda can be fine). Consistency as much as possible. 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 Find ways to add more vegetables Aim to take your Novolog before  meals.  Teaching Method Utilized:  Auditory  Handouts mailed after this visit include: (previous visits)  Types of insulin and function  NKD National Kidney Diet placemat for a kidney friendly meal without dialysis  Barriers to learning/adherence to lifestyle change: finances, social, health, motivation  Demonstrated degree of understanding via:  Teach Back   Monitoring/Evaluation:  Dietary intake, exercise, and body weight with follow up in 2 months.[name]

## 2021-03-02 NOTE — Patient Instructions (Signed)
Stress control Limit beer to 2 per night or choose not to drink Avoid added salt  Be sure that you are drinking adequate hydration (water is a great choice, mio and diet (sprite) soda can be fine). Consistency as much as possible. 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 Find ways to add more vegetables Aim to take your Novolog before meals.

## 2021-03-04 LAB — HM DIABETES EYE EXAM

## 2021-03-17 ENCOUNTER — Ambulatory Visit: Payer: Medicare Other | Admitting: Adult Health

## 2021-03-17 NOTE — Progress Notes (Addendum)
PATIENT: Joshua [name] DOB: [birth year]  REASON FOR VISIT: follow up HISTORY FROM: patient  HISTORY OF PRESENT ILLNESS: Today 03/18/21 Joshua [name] is a 65 year old male with history of multiple chronic medical conditions, NCV/EMG In August 2021 of the left upper and left lower extremity showed evidence of a severe primarily axonal peripheral neuropathy, possibly secondary to diabetes.  EMG was consistent with a diagnosis of peripheral neuropathy, without clear evidence of overlying lumbosacral radiculopathy.  Has chronic low back pain.  Has chronic kidney disease, was placed on gabapentin back in January 2021 cautiously due to his kidney disease.  Last creatinine on file in October 2021 was 3.24.  A1c last month was 8.3.  Initial consultation with Dr. Rexene Alberts was for gait disorder and recurrent falls. On gabapentin 100 mg at bedtime, feels memory isn't quite as sharp, not able to recall specific details. Symptoms of neuropathy are burning in fingers, and feet (but feet swell from heart issues). When walking his feet feel flat, triggers his low back. On bad days uses cane, when legs are swollen. Medicaid has denied MRI Lumbar Spine.  The low back feels strained when moves around. The back issues make walking issues that much more difficult. No radiation down the legs. Leans on cart at the grocery store. Gabapentin has helped with balance issues. No recent falls. His weight is up and down from fluid. Here today alone.   HISTORY  12/16/2020 Dr. Rexene Alberts: Joshua [name] is a 65 year old right-handed gentleman with an underlying medical history of hypertension, type 2 diabetes, gout, right foot osteomyelitiswith s/p amputation R forefoot in 07/2016,coronary artery disease, chronic kidney disease, and associated anemia and hyper parathyroidism, CHF, OSA on CPAP,and morbid obesity with a BMI of over 22, who Presents for follow-up consultation of his gait disorder and recurrent falls, as well as neuropathy.  The  patient is unaccompanied today.  I first met him on 06/09/2020 at the request of [name], at which time he reported a several month history of gait and balance problems as well as falls.  Examination was supportive of underlying neuropathy.  He also reported low back pain.  He was advised to proceed with a lumbar spine MRI, labs and EMG testing of the lower extremities.  His insurance denied a lumbar spine MRI.  He was advised to follow-up with his primary care physician regarding low back pain and discuss a referral to a spine specialist.  His labs from 06/09/2020 showed an elevated B12, RPR was negative, TSH normal, heavy metals negative, B6 normal, ANA negative, CK level elevated at 582, had been elevated in the range of 492-686 for the past 9 years. He had an EMG nerve conduction velocity test in the left lower and upper extremities on 07/16/2020 and I reviewed the results:  IMPRESSION:  Nerve conduction studies done on the left upper and left lower extremity shows evidence of a severe primarily axonal peripheral neuropathy, possibly secondary to diabetes.  EMG of the left lower extremity shows distal acute and chronic denervation consistent with a diagnosis of peripheral neuropathy without clear evidence of an overlying lumbosacral radiculopathy.  He was advised of the test results.  Today, 12/16/20: He reports that he has pain mostly in the left leg, mostly at night, his foot hurts.  Sometimes it is better.  He was taken off of several medications when he was hospitalized recently for kidney failure.  He was advised to reduce his furosemide.  He is trying to hydrate better.  He  has not seen a spine specialist but has mentioned that to his primary care physician.  He is also scheduled to see his cardiologist.  He has been taken off his cholesterol medication. He was hospitalized in October 2021 for acute on chronic kidney injury.  His A1c on 08/15/2020 was 7.5, improved.  He reports that he fell this past  weekend.  He was trying to get out of the driver seat of his car but his left foot got caught and he had a scrape on his left knee.  He still has some soreness.  The patient's allergies, current medications, family history, past medical history, past social history, past surgical history and problem list were reviewed and updated as appropriate.  Previously:   06/09/20: (He) reportsproblems with his gait and balance and recurrent falls for the past several months. He denies any burning sensation in his feet but has been told that he likely has neuropathy.  I reviewed your office note from 06/01/2020. His BUN was 24, creatinine 2.92 at the time. He is followed by endocrinology. He has had uncontrolled diabetes, A1c was over 15 in April 2020, more recently it was8.9 on 05/15/20. He isfollowed by endocrinology. He had physical therapy for about 5 sessions and was given walking poles. He does not like to use them. He does not have a walker. He has chronic low back pain on the left side, without radiation to the leg. He feels overall weaker in his feet. He had transmetatarsal amputation of the right foot in 2017. He had adapted to walking after surgery fairly well but in the past few months he has had balance issues and gait insecurity, recurrent falls. He fell and hurt his right knee. He has had right shoulder pain and has received injections with cortisone twice into the right shoulder. He has not seen orthopedics for his right knee. He reports swelling and pain in the right knee. He tries to hydrate well with water, drinks diet tea, several servings per day and drinks alcohol several servings per week in the form of beer, rare liquor. He is a non-smoker. He denies any stinging pain but has dull achy pain in the feet at night, not every night. He does not notice any actual numbness. He has a strong family history of diabetes and had an uncle with neuropathy as he recalls.   REVIEW OF  SYSTEMS: Out of a complete 14 system review of symptoms, the patient complains only of the following symptoms, and all other reviewed systems are negative.  See HPI  ALLERGIES: Allergies  Allergen Reactions  . Chlorthalidone Other (See Comments)    Causes dehydration, kidneys shut down    HOME MEDICATIONS: Outpatient Medications Prior to Visit  Medication Sig Dispense Refill  . Accu-Chek FastClix Lancets MISC 1 each by Other route in the morning, at noon, in the evening, and at bedtime.     [name] ACCU-CHEK GUIDE test strip USE UP TO FOUR TIMES DAILY AS DIRECTED (Patient taking differently: 1 each by Other route See admin instructions. USE UP TO FOUR TIMES DAILY AS DIRECTED) 300 each 3  . acetaminophen (TYLENOL) 650 MG CR tablet Take 1,300 mg by mouth daily as needed for pain.    [name] allopurinol (ZYLOPRIM) 300 MG tablet Take 300 mg by mouth daily.     [name] amLODipine (NORVASC) 10 MG tablet Take 1 tablet by mouth once daily 90 tablet 3  . aspirin EC 81 MG tablet Take 81 mg by mouth daily.     [name]  blood glucose meter kit and supplies Dispense based on patient and insurance preference. Use up to four times daily as directed. (FOR ICD-10 E10.9, E11.9). (Patient taking differently: 1 each by Other route See admin instructions. Dispense based on patient and insurance preference. Use up to four times daily as directed. (FOR ICD-10 E10.9, E11.9).) 1 each 0  . famotidine (PEPCID) 20 MG tablet Take 20 mg by mouth at bedtime.    . furosemide (LASIX) 40 MG tablet Take 40 mg by mouth 2 (two) times daily.    . insulin aspart (NOVOLOG) 100 UNIT/ML injection 5-8 units    . Insulin Glargine (BASAGLAR KWIKPEN) 100 UNIT/ML Inject 30 Units into the skin 2 (two) times daily.    . metoprolol succinate (TOPROL XL) 25 MG 24 hr tablet Take 3 tablets (75 mg total) by mouth daily. 270 tablet 3  . nitroGLYCERIN (NITROSTAT) 0.4 MG SL tablet Place 1 tablet (0.4 mg total) under the tongue every 5 (five) minutes as needed for chest  pain. 25 tablet 1  . pantoprazole (PROTONIX) 40 MG tablet TAKE 1 TABLET BY MOUTH TWICE DAILY BEFORE MEAL(S) 180 tablet 0  . ticagrelor (BRILINTA) 90 MG TABS tablet Take 1 tablet (90 mg total) by mouth 2 (two) times daily. 180 tablet 3  . tretinoin (RETIN-A) 0.1 % cream Apply topically daily.    [name] VICTOZA 18 MG/3ML SOPN INJECT 0.3 MLS (1.8MG TOTAL) INTO THE SKIN DAILY 9 mL 0  . gabapentin (NEURONTIN) 100 MG capsule Take 1 capsule (100 mg total) by mouth at bedtime. 30 capsule 5   No facility-administered medications prior to visit.    PAST MEDICAL HISTORY: Past Medical History:  Diagnosis Date  . Anemia   . Chronic combined systolic and diastolic CHF (congestive heart failure) ([place])    a. 07/2015 Echo: EF 45-50%, mod LVH, mid apical/antsept AK, Gr 1 DD.  [name] CKD (chronic kidney disease), stage III ([place])   . Coronary artery disease    a. 2012 s/p MI/PCI: s/p DES to mid/dist RCA and overlapping DES to LAD;  b. 07/2015 Cath: LAD 10% ISR, D1 40(jailed), LCX 65m OM2 30, OM3 30, RCA 536mSR, 10d ISR; c. 07/2016 NSTEMI/PCI: LAD 85ost/p/m (3.5x20 Synergy DES overlapping prior stent), D1 40, LCX 7532mM2 95 (2.75x16 Synergy DES), OM3 30, RCA 32m33md.  . Depression   . Diabetic gastroparesis (HCC)[place]. DKA (diabetic ketoacidoses) 03/14/2018  . GERD (gastroesophageal reflux disease)   . Gout   . Heart murmur   . Hyperlipemia   . Hypertension   . Hypertensive heart disease   . Ischemic cardiomyopathy    a. 07/2015 Echo: EF 45-50%, mod LVH, mid-apicalanteroseptal AK, Gr1 DD.  . Morbid obesity (HCC)[place]. OSA on CPAP   . Osteomyelitis (HCC)[place] a. 07/2016 MTP joint of R great toe s/p transmetatarsal amputation.  . Polyneuropathy in diabetes(357.2)   . Pulmonary sarcoidosis (HCC)[place] "problems w/it years ago; no problems anymore" (07/03/2014)  . Rectal bleeding   . Sleep apnea   . Type II diabetes mellitus (HCC)[place]  PAST SURGICAL HISTORY: Past Surgical History:  Procedure Laterality Date  .  AMPUTATION Right 07/24/2016   Procedure: TRANSMETATARSAL FOOT AMPUTATION;  Surgeon: MarcNewt Minion;  [facility];  Laterality: Right;  . BALLOON DILATION N/A 03/21/2018   Procedure: BALLOON DILATION;  Surgeon: BuccRonald Lobo;  [facility];  Service: Endoscopy;  Laterality: N/A;  .  BREAST LUMPECTOMY Right ~ 2000   "benign"  . CARDIAC CATHETERIZATION N/A 07/30/2015   Procedure: Left Heart Cath and Coronary Angiography;  Surgeon: Burnell Blanks, MD;  Location: [place] CV LAB;  Service: Cardiovascular;  Laterality: N/A;  . CARDIAC CATHETERIZATION N/A 07/19/2016   Procedure: Left Heart Cath and Coronary Angiography;  Surgeon: Belva Crome, MD;  Location: [place] CV LAB;  Service: Cardiovascular;  Laterality: N/A;  . CARDIAC CATHETERIZATION N/A 07/29/2016   Procedure: Coronary Stent Intervention;  Surgeon: Belva Crome, MD;  Location: [place] CV LAB;  Service: Cardiovascular;  Laterality: N/A;  . CARDIAC CATHETERIZATION N/A 07/29/2016   Procedure: Intravascular Ultrasound/IVUS;  Surgeon: Belva Crome, MD;  Location: [place] [facility] LAB;  Service: Cardiovascular;  Laterality: N/A;  . CATARACT EXTRACTION W/ INTRAOCULAR LENS  IMPLANT, BILATERAL Bilateral 2014-2015  . COLONOSCOPY WITH PROPOFOL N/A 08/22/2017   Procedure: COLONOSCOPY WITH PROPOFOL;  Surgeon: [name], MD;  Location: [place];  Service: Endoscopy;  Laterality: N/A;  . CORONARY ANGIOPLASTY WITH STENT PLACEMENT  10/31/2011   30 % MID ATRIOVENTRICULAR GROOVE CX STENOSIS. 90 % MID RCA.PTA TO LAD/STENTING OF TANDEM 60-70%. LAD STENOSIS 99% REDUCED TO 0%.3.0 X 32MM PROMUS DES POSTDILATED TO 3.25MM.  [name] CORONARY ANGIOPLASTY WITH STENT PLACEMENT  07/03/2014   "2"  . CORONARY STENT INTERVENTION N/A 04/09/2019   Procedure: CORONARY STENT INTERVENTION;  Surgeon: Jettie Booze, MD;  Location: [place] CV LAB;  Service: Cardiovascular;  Laterality: N/A;  . CORONARY STENT  INTERVENTION N/A 08/22/2019   Procedure: CORONARY STENT INTERVENTION;  Surgeon: Martinique, Peter M, MD;  Location: [place] CV LAB;  Service: Cardiovascular;  Laterality: N/A;  . ESOPHAGOGASTRODUODENOSCOPY (EGD) WITH PROPOFOL N/A 03/21/2018   Procedure: ESOPHAGOGASTRODUODENOSCOPY (EGD) WITH PROPOFOL;  Surgeon: [name], MD;  Location: [place];  Service: Endoscopy;  Laterality: N/A;  . I & D EXTREMITY Right 10/30/2013   Procedure: IRRIGATION AND DEBRIDEMENT RIGHT SMALL FINGER POSSIBLE SECONDARY WOUND CLOSURE;  Surgeon: [name], MD;  Location: [facility];  Service: Orthopedics;  Laterality: Right;  [name] is available after 4pm  . I & D EXTREMITY Right 11/01/2013   Procedure:  IRRIGATION AND DEBRIDEMENT RIGHT  PROXIMAL PHALANGEAL LEVEL AMPUTATION;  Surgeon: [name], MD;  [facility];  Service: Orthopedics;  Laterality: Right;  . INCISION AND DRAINAGE Right 10/28/2013   Procedure: INCISION AND DRAINAGE Right small finger;  Surgeon: [name], MD;  [facility];  Service: Orthopedics;  Laterality: Right;  . LEFT HEART CATH Left 11/03/2011   Procedure: LEFT HEART CATH;  Surgeon: [name], MD;  Location[facility] CATH LAB;  Service: Cardiovascular;  Laterality: Left;  . LEFT HEART CATH AND CORONARY ANGIOGRAPHY N/A 04/09/2019   Procedure: LEFT HEART CATH AND CORONARY ANGIOGRAPHY;  Surgeon: Jettie Booze, MD;  Location: [place] CV LAB;  Service: Cardiovascular;  Laterality: N/A;  . LEFT HEART CATHETERIZATION WITH CORONARY ANGIOGRAM N/A 07/03/2014   Procedure: LEFT HEART CATHETERIZATION WITH CORONARY ANGIOGRAM;  Surgeon: [name], MD;  [facility] C[facility] LAB;  Service: Cardiovascular;  Laterality: N/A;  . PERCUTANEOUS CORONARY STENT INTERVENTION (PCI-S) Left 11/03/2011   Procedure: PERCUTANEOUS CORONARY STENT INTERVENTION (PCI-S);  Surgeon: [name], MD;  Location: [facility] CATH LAB;  Service: Cardiovascular;  Laterality: Left;  . RIGHT/LEFT  HEART CATH AND CORONARY ANGIOGRAPHY N/A 08/22/2019   Procedure: RIGHT/LEFT HEART CATH AND CORONARY ANGIOGRAPHY;  Surgeon: Martinique, Peter M, MD;  Location: [place] CV LAB;  Service: Cardiovascular;  Laterality: N/A;  . TOE AMPUTATION Right ~ 2010   "2nd toe"  . TOE AMPUTATION Right 2012   "3rd toe"    FAMILY HISTORY: Family History  Problem Relation Age of Onset  . Heart attack Maternal Aunt   . Diabetes Maternal Grandmother   . Hypertension Maternal Grandmother     SOCIAL HISTORY: Social History   Socioeconomic History  . Marital status: Married    Spouse name: Not on file  . Number of children: Not on file  . Years of education: Not on file  . Highest education level: Not on file  Occupational History  . Not on file  Tobacco Use  . Smoking status: Never Smoker  . Smokeless tobacco: Never Used  Vaping Use  . Vaping Use: Never used  Substance and Sexual Activity  . Alcohol use: Yes    Comment: social   . Drug use: No  . Sexual activity: Yes  Other Topics Concern  . Not on file  Social History Narrative  . Not on file   Social Determinants of Health   Financial Resource Strain: Not on file  Food Insecurity: Not on file  Transportation Needs: Not on file  Physical Activity: Not on file  Stress: Not on file  Social Connections: Not on file  Intimate Partner Violence: Not on file   PHYSICAL EXAM  Vitals:   03/18/21 0813  BP: (!) 157/80  Pulse: 88  Weight: (!) 311 lb (141.1 kg)  Height: '5\' 9"'  (1.753 m)   Body mass index is 45.93 kg/m.  Generalized: Well developed, in no acute distress   Neurological examination  Mentation: Alert oriented to time, place, history taking. Follows all commands speech and language fluent Cranial nerve II-XII: Pupils were equal round reactive to light. Extraocular movements were full, visual field were full on confrontational test. Facial sensation and strength were normal. Head turning and shoulder shrug were normal and  symmetric. Motor: The motor testing reveals 5 over 5 strength of all 4 extremities. Good symmetric motor tone is noted throughout. Right forefoot amputation Sensory: Sensory testing is intact to soft touch on all 4 extremities. No evidence of extinction is noted.  Coordination: Cerebellar testing reveals good finger-nose-finger and heel-to-shin bilaterally, but feels the left foot is heavier to lift Gait and station: Gait is slightly wide-based, slight limp Reflexes: Deep tendon reflexes were decreased  DIAGNOSTIC DATA (LABS, IMAGING, TESTING) - I reviewed patient records, labs, notes, testing and imaging myself where available.  Lab Results  Component Value Date   WBC 6.3 09/28/2020   HGB 11.8 (L) 09/28/2020   HCT 36.9 (L) 09/28/2020   MCV 89.3 09/28/2020   PLT 159 09/28/2020      Component Value Date/Time   NA 136 09/28/2020 0320   NA 134 12/11/2018 1420   K 3.8 09/28/2020 0320   CL 104 09/28/2020 0320   CO2 22 09/28/2020 0320   GLUCOSE 253 (H) 09/28/2020 0320   BUN 51 (H) 09/28/2020 0320   BUN 44 (H) 12/11/2018 1420   CREATININE 3.24 (H) 09/28/2020 0320   CREATININE 2.06 (H) 11/17/2016 0921   CALCIUM 9.1 09/28/2020 0320   PROT 6.5 09/26/2020 0255   ALBUMIN 3.1 (L) 09/26/2020 0255   AST 205 (H) 09/26/2020 0255   ALT 207 (H) 09/26/2020 0255   ALKPHOS 141 (H) 09/26/2020 0255   BILITOT 0.6 09/26/2020 0255   GFRNONAA 19 (L) 09/28/2020 0320   GFRNONAA 51 (L) 11/01/2016 1012   GFRAA 27 (  L) 08/23/2019 0340   GFRAA 58 (L) 11/01/2016 1012   Lab Results  Component Value Date   CHOL 168 08/17/2019   HDL 85 08/17/2019   LDLCALC 71 08/17/2019   TRIG 60 08/17/2019   CHOLHDL 2.0 08/17/2019   Lab Results  Component Value Date   HGBA1C 8.3 (A) 02/10/2021   Lab Results  Component Value Date   VITAMINB12 1,612 (H) 06/09/2020   Lab Results  Component Value Date   TSH 3.360 06/09/2020      ASSESSMENT AND PLAN 65 y.o. year old male  has a past medical history of Anemia,  Chronic combined systolic and diastolic CHF (congestive heart failure) ([place]), CKD (chronic kidney disease), stage III ([place]), Coronary artery disease, Depression, Diabetic gastroparesis ([place]), DKA (diabetic ketoacidoses) (03/14/2018), GERD (gastroesophageal reflux disease), Gout, Heart murmur, Hyperlipemia, Hypertension, Hypertensive heart disease, Ischemic cardiomyopathy, Morbid obesity ([place]), OSA on CPAP, Osteomyelitis ([place]), Polyneuropathy in diabetes(357.2), Pulmonary sarcoidosis ([place]), Rectal bleeding, Sleep apnea, and Type II diabetes mellitus ([place]). here with:  1.  Peripheral neuropathy 2.  Type 2 diabetes 3.  Obesity 4.  Chronic kidney disease 5.  Chronic low back pain  -Continue gabapentin 100 mg at bedtime, has been overall beneficial, will need to be cautious with the dosing, due to CKD -MRI of the lumbar spine has been denied, I offered to reorder, proceed with peer-to-peer if necessary, he prefers to proceed with referral to spine specialist, I will place a referral (was not done by PCP), history of chronic low back pain, no radiation down the legs, worsens with prolonged standing -NCV/EMG August 2021 of the left upper and lower extremity showed evidence of a severe primarily axonal peripheral neuropathy, possibly secondary to diabetes, no clear evidence of overlying lumbosacral radiculopathy -Encouraged good control of diabetes, consider weight loss -He will follow-up in 6 months or sooner if needed, if he remains overall stable, he may follow-up at our office on an as-needed basis  I spent 30 minutes of face-to-face and non-face-to-face time with patient.  This included previsit chart review, lab review, study review, order entry, electronic health record documentation, patient education.  Butler Denmark, AGNP-C, DNP 03/18/2021, 8:42 AM [facility] [address], [place], [place] [address] [phone]  I reviewed the above note and documentation by the Nurse  Practitioner and agree with the history, exam, assessment and plan as outlined above. I was available for consultation. Star Age, MD, PhD [facility])

## 2021-03-18 ENCOUNTER — Ambulatory Visit (INDEPENDENT_AMBULATORY_CARE_PROVIDER_SITE_OTHER): Payer: Medicare Other | Admitting: Neurology

## 2021-03-18 ENCOUNTER — Encounter: Payer: Self-pay | Admitting: Neurology

## 2021-03-18 VITALS — BP 157/80 | HR 88 | Ht 69.0 in | Wt 311.0 lb

## 2021-03-18 DIAGNOSIS — E1142 Type 2 diabetes mellitus with diabetic polyneuropathy: Secondary | ICD-10-CM

## 2021-03-18 DIAGNOSIS — M545 Low back pain, unspecified: Secondary | ICD-10-CM | POA: Diagnosis not present

## 2021-03-18 DIAGNOSIS — G8929 Other chronic pain: Secondary | ICD-10-CM | POA: Insufficient documentation

## 2021-03-18 MED ORDER — GABAPENTIN 100 MG PO CAPS: 100.0000 mg | ORAL_CAPSULE | Freq: Every day | ORAL | 5 refills | Status: DC

## 2021-03-18 NOTE — Patient Instructions (Signed)
Continue gabapentin at current dosing Referral to spine specialist Get good control of diabetes  See you back in 6 months

## 2021-03-30 ENCOUNTER — Other Ambulatory Visit: Payer: Self-pay | Admitting: Physician Assistant

## 2021-03-30 DIAGNOSIS — M545 Low back pain, unspecified: Secondary | ICD-10-CM

## 2021-04-03 ENCOUNTER — Ambulatory Visit
Admission: RE | Admit: 2021-04-03 | Discharge: 2021-04-03 | Disposition: A | Discharge status: Home / Self Care | Payer: Medicare Other | Source: Ambulatory Visit | Attending: Physician Assistant | Admitting: Physician Assistant

## 2021-04-03 DIAGNOSIS — M545 Low back pain, unspecified: Secondary | ICD-10-CM

## 2021-04-15 ENCOUNTER — Other Ambulatory Visit: Payer: Self-pay | Admitting: Internal Medicine

## 2021-05-01 ENCOUNTER — Telehealth: Payer: Self-pay | Admitting: Internal Medicine

## 2021-05-01 ENCOUNTER — Other Ambulatory Visit: Payer: Self-pay | Admitting: Internal Medicine

## 2021-05-01 NOTE — Telephone Encounter (Signed)
Pt is needing a medication refill for VICTOZA 18 MG/3ML [place], [address]

## 2021-05-04 ENCOUNTER — Ambulatory Visit: Payer: Medicare Other | Attending: Physician Assistant | Admitting: Rehabilitative and Restorative Service Providers"

## 2021-05-04 ENCOUNTER — Other Ambulatory Visit: Payer: Self-pay

## 2021-05-04 ENCOUNTER — Encounter: Payer: Self-pay | Admitting: Rehabilitative and Restorative Service Providers"

## 2021-05-04 DIAGNOSIS — R262 Difficulty in walking, not elsewhere classified: Secondary | ICD-10-CM

## 2021-05-04 DIAGNOSIS — R296 Repeated falls: Secondary | ICD-10-CM | POA: Diagnosis not present

## 2021-05-04 DIAGNOSIS — R600 Localized edema: Secondary | ICD-10-CM

## 2021-05-04 DIAGNOSIS — M545 Low back pain, unspecified: Secondary | ICD-10-CM | POA: Diagnosis present

## 2021-05-04 DIAGNOSIS — G8929 Other chronic pain: Secondary | ICD-10-CM

## 2021-05-04 DIAGNOSIS — M6281 Muscle weakness (generalized): Secondary | ICD-10-CM | POA: Diagnosis present

## 2021-05-04 NOTE — Therapy (Signed)
[place]. [place], [place], [address] Phone: [phone]   Fax:  [phone]  Physical Therapy Evaluation  Patient Details  Name: Joshua [name] MRN: [id number] Date of Birth: [birth year] Referring Provider (PT): Benjiman Core, [place]   Encounter Date: 05/04/2021   PT End of Session - 05/04/21 1052    Visit Number 1    Date for PT Re-Evaluation 07/24/21    PT Start Time 9211    PT Stop Time 1055    PT Time Calculation (min) 40 min    Activity Tolerance Patient tolerated treatment well;Patient limited by pain    Behavior During Therapy [facility] for tasks assessed/performed           Past Medical History:  Diagnosis Date  . Anemia   . Chronic combined systolic and diastolic CHF (congestive heart failure) ([place])    a. 07/2015 Echo: EF 45-50%, mod LVH, mid apical/antsept AK, Gr 1 DD.  [name] CKD (chronic kidney disease), stage III ([place])   . Coronary artery disease    a. 2012 s/p MI/PCI: s/p DES to mid/dist RCA and overlapping DES to LAD;  b. 07/2015 Cath: LAD 10% ISR, D1 40(jailed), LCX 62m, OM2 30, OM3 30, RCA 63m ISR, 10d ISR; c. 07/2016 NSTEMI/PCI: LAD 85ost/p/m (3.5x20 Synergy DES overlapping prior stent), D1 40, LCX 8m, OM2 95 (2.75x16 Synergy DES), OM3 30, RCA 31m, 10d.  . Depression   . Diabetic gastroparesis ([place])   . DKA (diabetic ketoacidoses) 03/14/2018  . GERD (gastroesophageal reflux disease)   . Gout   . Heart murmur   . Hyperlipemia   . Hypertension   . Hypertensive heart disease   . Ischemic cardiomyopathy    a. 07/2015 Echo: EF 45-50%, mod LVH, mid-apicalanteroseptal AK, Gr1 DD.  . Morbid obesity ([place])   . OSA on CPAP   . Osteomyelitis ([place])    a. 07/2016 MTP joint of R great toe s/p transmetatarsal amputation.  . Polyneuropathy in diabetes(357.2)   . Pulmonary sarcoidosis ([place])    "problems w/it years ago; no problems anymore" (07/03/2014)  . Rectal bleeding   . Sleep apnea   . Type II diabetes mellitus  ([place])     Past Surgical History:  Procedure Laterality Date  . AMPUTATION Right 07/24/2016   Procedure: TRANSMETATARSAL FOOT AMPUTATION;  Surgeon: [name], MD;  Location: [place];  Service: Orthopedics;  Laterality: Right;  . BALLOON DILATION N/A 03/21/2018   Procedure: BALLOON DILATION;  Surgeon: [name], MD;  Location: [facility] ENDOSCOPY;  Service: Endoscopy;  Laterality: N/A;  . BREAST LUMPECTOMY Right ~ 2000   "benign"  . CARDIAC CATHETERIZATION N/A 07/30/2015   Procedure: Left Heart Cath and Coronary Angiography;  Surgeon: Burnell Blanks, MD;  Location: [place] CV LAB;  Service: Cardiovascular;  Laterality: N/A;  . CARDIAC CATHETERIZATION N/A 07/19/2016   Procedure: Left Heart Cath and Coronary Angiography;  Surgeon: Belva Crome, MD;  Location: [place] CV LAB;  Service: Cardiovascular;  Laterality: N/A;  . CARDIAC CATHETERIZATION N/A 07/29/2016   Procedure: Coronary Stent Intervention;  Surgeon: Belva Crome, MD;  Location: [place] [facility] LAB;  Service: Cardiovascular;  Laterality: N/A;  . CARDIAC CATHETERIZATION N/A 07/29/2016   Procedure: Intravascular Ultrasound/IVUS;  Surgeon: Belva Crome, MD;  Location: [place] CV LAB;  Service: Cardiovascular;  Laterality: N/A;  . CATARACT EXTRACTION W/ INTRAOCULAR LENS  IMPLANT, BILATERAL Bilateral 2014-2015  . COLONOSCOPY WITH PROPOFOL N/A 08/22/2017   Procedure: COLONOSCOPY  WITH PROPOFOL;  Surgeon: [name], MD;  Location: [place];  Service: Endoscopy;  Laterality: N/A;  . CORONARY ANGIOPLASTY WITH STENT PLACEMENT  10/31/2011   30 % MID ATRIOVENTRICULAR GROOVE CX STENOSIS. 90 % MID RCA.PTA TO LAD/STENTING OF TANDEM 60-70%. LAD STENOSIS 99% REDUCED TO 0%.3.0 X 32MM PROMUS DES POSTDILATED TO 3.25MM.  [name] CORONARY ANGIOPLASTY WITH STENT PLACEMENT  07/03/2014   "2"  . CORONARY STENT INTERVENTION N/A 04/09/2019   Procedure: CORONARY STENT INTERVENTION;  Surgeon: Jettie Booze, MD;  Location: [place] CV  LAB;  Service: Cardiovascular;  Laterality: N/A;  . CORONARY STENT INTERVENTION N/A 08/22/2019   Procedure: CORONARY STENT INTERVENTION;  Surgeon: Martinique, Peter M, MD;  Location: [place] CV LAB;  Service: Cardiovascular;  Laterality: N/A;  . ESOPHAGOGASTRODUODENOSCOPY (EGD) WITH PROPOFOL N/A 03/21/2018   Procedure: ESOPHAGOGASTRODUODENOSCOPY (EGD) WITH PROPOFOL;  Surgeon: [name], MD;  Location: [place];  Service: Endoscopy;  Laterality: N/A;  . I & D EXTREMITY Right 10/30/2013   Procedure: IRRIGATION AND DEBRIDEMENT RIGHT SMALL FINGER POSSIBLE SECONDARY WOUND CLOSURE;  Surgeon: [name], MD;  Location: [facility];  Service: Orthopedics;  Laterality: Right;  [name] is available after 4pm  . I & D EXTREMITY Right 11/01/2013   Procedure:  IRRIGATION AND DEBRIDEMENT RIGHT  PROXIMAL PHALANGEAL LEVEL AMPUTATION;  Surgeon: [name], MD;  Location: [facility];  Service: Orthopedics;  Laterality: Right;  . INCISION AND DRAINAGE Right 10/28/2013   Procedure: INCISION AND DRAINAGE Right small finger;  Surgeon: [name], MD;  Location: [facility];  Service: Orthopedics;  Laterality: Right;  . LEFT HEART CATH Left 11/03/2011   Procedure: LEFT HEART CATH;  Surgeon: [name], MD;  Location: [facility] CATH LAB;  Service: Cardiovascular;  Laterality: Left;  . LEFT HEART CATH AND CORONARY ANGIOGRAPHY N/A 04/09/2019   Procedure: LEFT HEART CATH AND CORONARY ANGIOGRAPHY;  Surgeon: Jettie Booze, MD;  Location: [place] CV LAB;  Service: Cardiovascular;  Laterality: N/A;  . LEFT HEART CATHETERIZATION WITH CORONARY ANGIOGRAM N/A 07/03/2014   Procedure: LEFT HEART CATHETERIZATION WITH CORONARY ANGIOGRAM;  Surgeon: [name], MD;  Location: [facility] CATH LAB;  Service: Cardiovascular;  Laterality: N/A;  . PERCUTANEOUS CORONARY STENT INTERVENTION (PCI-S) Left 11/03/2011   Procedure: PERCUTANEOUS CORONARY STENT INTERVENTION (PCI-S);  Surgeon: [name], MD;  Location: [facility] CATH  LAB;  Service: Cardiovascular;  Laterality: Left;  . RIGHT/LEFT HEART CATH AND CORONARY ANGIOGRAPHY N/A 08/22/2019   Procedure: RIGHT/LEFT HEART CATH AND CORONARY ANGIOGRAPHY;  Surgeon: Martinique, Peter M, MD;  Location: [place] CV LAB;  Service: Cardiovascular;  Laterality: N/A;  . TOE AMPUTATION Right ~ 2010   "2nd toe"  . TOE AMPUTATION Right 2012   "3rd toe"    There were no vitals filed for this visit.    Subjective Assessment - 05/04/21 1018    Subjective Reports approx 3 year hx of having back pain.  He has increased pain with standing and walking.  He reports it usually feels strained and sore.  He is having difficulty with getting in and out of bed.  He states that the MD told him that he has narrowing of his lumbar discs in certain places.    Pertinent History DM, partial R foot amputation    Limitations Standing;Walking    How long can you stand comfortably? 3-4 min    Diagnostic tests MRI and xray of back    Patient Stated Goals I would love to be able  to walk and stand and sit up straight for a while without my back bothering me.    Currently in Pain? Yes    Pain Score 6     Pain Location Back    Pain Orientation Lower    Pain Descriptors / Indicators Tightness;Sore    Pain Type Chronic pain    Pain Radiating Towards sometimes radiates down L hip/hamstring region    Pain Onset More than a month ago    Multiple Pain Sites No              [facility] PT Assessment - 05/04/21 0001      Assessment   Medical Diagnosis Chronic L sided back pain without sciatica    Referring Provider (PT) Benjiman Core, PA    Hand Dominance Right    Next MD Visit Dr Lorin Mercy 05/05/21    Prior Therapy has had PT for neuropathy in the past      Precautions   Precautions None      Restrictions   Weight Bearing Restrictions No      Balance Screen   Has the patient fallen in the past 6 months Yes    How many times? 4    Has the patient had a decrease in activity level because of a fear of  falling?  Yes    Is the patient reluctant to leave their home because of a fear of falling?  No      Home Social worker Private residence    Living Arrangements Spouse/significant other;Children    Type of [place] One level    [place] - 2 wheels;Cane - single point;Bedside commode      Prior Function   Level of Independence Independent    Vocation On disability    Leisure Acting      Cognition   Overall Cognitive Status Within Functional Limits for tasks assessed      Observation/Other Assessments   Focus on Therapeutic Outcomes (FOTO)  40%, risk adjusted 37%      ROM / Strength   AROM / PROM / Strength AROM;Strength      AROM   AROM Assessment Site Lumbar    Lumbar Flexion increased pain with decreased motion by 50%    Lumbar Extension decreased by 50%      Strength   Overall Strength Deficits    Overall Strength Comments Bilat hip strength of 4/5, hamstring strength of 4/5      Standardized Balance Assessment   Standardized Balance Assessment Berg Balance Test      Berg Balance Test   Sit to Stand Able to stand  independently using hands    Standing Unsupported Able to stand 2 minutes with supervision    Sitting with Back Unsupported but Feet Supported on Floor or Stool Able to sit safely and securely 2 minutes    Stand to Sit Uses backs of legs against chair to control descent    Transfers Able to transfer safely, definite need of hands    Standing Unsupported with Eyes Closed Needs help to keep from falling    Standing Unsupported with Feet Together Needs help to attain position and unable to hold for 15 seconds    From Standing, Reach Forward with Outstretched Arm Can reach forward >5 cm safely (2")    From Standing Position, Pick up Object from Floor Unable to pick up shoe, but reaches 2-5 cm (1-2") from shoe and  balances independently    From Standing Position, Turn to Look Behind Over each Shoulder Needs assist  to keep from losing balance and falling    Turn 360 Degrees Needs close supervision or verbal cueing    Standing Unsupported, Alternately Place Feet on Step/Stool Able to complete >2 steps/needs minimal assist    Standing Unsupported, One Foot in [place] to take small step independently and hold 30 seconds    Standing on One Leg Unable to try or needs assist to prevent fall    Total Score 23                      Objective measurements completed on examination: See above findings.               PT Education - 05/04/21 1051    Education Details Issued HEP    Person(s) Educated Patient    Methods Explanation;Demonstration;Handout    Comprehension Verbalized understanding;Returned demonstration            PT Short Term Goals - 05/04/21 1346      PT SHORT TERM GOAL #1   Title Patient will be independent with initial HEP.    Time 4    Period Weeks    Status New             PT Long Term Goals - 05/04/21 1347      PT LONG TERM GOAL #1   Title Patient will be independent with advanced HEP.    Time 10    Period Weeks    Status New      PT LONG TERM GOAL #2   Title Patient will improve TUG to 13 sec for reduced fall risk.    Time 12    Period Weeks    Status New      PT LONG TERM GOAL #3   Title Patient will increase BERG to at least 48/56 to place him at a low risk of falling    Baseline 23/56    Time 12    Period Weeks    Status New      PT LONG TERM GOAL #4   Title Patient will increase functional LE and lumbar strength to at least 5/5 to allow him to more easily perform transfers and ambulation.    Time 12    Period Weeks    Status New                  Plan - 05/04/21 1057    Clinical Impression Statement Patient presents with a referral from Benjiman Core, PA secondary to low back pain without sciatica with recent falls.  He presents with weakness, decreased balance, difficulty walking, back stiffness, and abnormal posture.   Patient enjoys acting, but has been having difficulty secondary to his balance problems.  Patient would benefit from skilled PT to address his functional impairments and allow him to return to his acting and decrease his risk of falling.    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 complexity    Clinical Decision Making Moderate    Rehab Potential Good    PT Frequency 2x / week    PT Duration 12 weeks    PT Treatment/Interventions ADLs/Self Care Home Management;Cryotherapy;Electrical Stimulation;Iontophoresis 4mg /ml Dexamethasone;Moist Heat;Traction;Ultrasound;Gait training;Stair training;Therapeutic activities;Therapeutic exercise;Balance training;Neuromuscular re-education;Dry needling;Passive range of motion;Spinal Manipulations;Joint Manipulations  PT Next Visit Plan strengthening, balance    PT Home Exercise Plan Access Code: [email or web address]    Consulted and Agree with Plan of Care Patient           Patient will benefit from skilled therapeutic intervention in order to improve the following deficits and impairments:  Difficulty walking,Decreased endurance,Increased muscle spasms,Obesity,Decreased activity tolerance,Pain,Decreased balance,Impaired flexibility,Decreased strength,Postural dysfunction  Visit Diagnosis: Repeated falls - Plan: PT plan of care cert/re-cert  Muscle weakness (generalized) - Plan: PT plan of care cert/re-cert  Difficulty in walking, not elsewhere classified - Plan: PT plan of care cert/re-cert  Localized edema - Plan: PT plan of care cert/re-cert  Chronic bilateral low back pain without sciatica - Plan: PT plan of care cert/re-cert     Problem List Patient Active Problem List   Diagnosis Date Noted  . Chronic low back pain   . Hypokalemia  09/25/2020  . Type 2 diabetes mellitus with stage 3 chronic kidney disease ([place]) 09/24/2020  . Unstable angina ([place]) 08/17/2019  . Chest pain 08/16/2019  . NSTEMI (non-ST elevated myocardial infarction) ([place]) 04/06/2019  . ACS (acute coronary syndrome) ([place]) 04/06/2019  . Sepsis ([place]) 03/15/2018  . DKA (diabetic ketoacidoses) 03/15/2018  . Special screening for malignant neoplasms, colon 08/22/2017  . Acute renal failure superimposed on stage 3 chronic kidney disease ([place]) 05/19/2017  . Essential hypertension   . Status post transmetatarsal amputation of foot, right ([place]) 11/08/2016  . Diabetic polyneuropathy associated with type 2 diabetes mellitus ([place]) 11/08/2016  . Pure hypercholesterolemia   . AKI (acute kidney injury) ([place])   . Acute blood loss anemia   . DM type 2 with diabetic peripheral neuropathy ([place])   . Physical debility 08/03/2016  . Chronic osteomyelitis of right foot ([place])   . Cellulitis and abscess of leg, except foot   . Penetrating wound of right foot   . CKD (chronic kidney disease) stage 3, GFR 30-59 ml/min ([facility])   . Unstable angina pectoris ([place]) 07/18/2016  . Chronic combined systolic and diastolic congestive heart failure ([place])   . Chest pain with moderate risk for cardiac etiology 06/24/2014  . CAD S/P multiple PCIs 06/24/2014  . Sleep apnea- on C-pap 06/24/2014  . Morbid obesity due to excess calories ([place]) 11/10/2013  . Hyperglycemia 10/29/2013  . Hyperlipidemia 01/12/2011  . Benign essential HTN 01/12/2011  . GERD 01/12/2011    Juel Burrow, PT, DPT 05/04/2021, 1:51 PM  [place]. [place], [place], [address] Phone: [phone]   Fax:  [phone]  Name: Joshua [name] MRN: [id number] Date of Birth: 05/14/1956

## 2021-05-04 NOTE — Patient Instructions (Signed)
Access Code: [email or web address] URL: [email or web address] Date: 05/04/2021 Prepared by: Juel Burrow  Exercises Standing March with Counter Support - 2 x daily - 7 x weekly - 1-2 sets - 10 reps Standing Hip Abduction with Counter Support - 2 x daily - 7 x weekly - 1-2 sets - 10 reps Standing Hip Extension with Counter Support - 2 x daily - 7 x weekly - 1-2 sets - 10 reps Sit to Stand - 2 x daily - 7 x weekly - 1-2 sets - 5 reps

## 2021-05-05 ENCOUNTER — Ambulatory Visit (INDEPENDENT_AMBULATORY_CARE_PROVIDER_SITE_OTHER): Payer: Medicare Other | Admitting: Orthopaedic Surgery

## 2021-05-05 ENCOUNTER — Encounter: Payer: Self-pay | Admitting: Orthopaedic Surgery

## 2021-05-05 VITALS — BP 159/81 | HR 83 | Ht 69.0 in | Wt 304.0 lb

## 2021-05-05 DIAGNOSIS — G8929 Other chronic pain: Secondary | ICD-10-CM

## 2021-05-05 DIAGNOSIS — M545 Low back pain, unspecified: Secondary | ICD-10-CM

## 2021-05-05 DIAGNOSIS — Z9861 Coronary angioplasty status: Secondary | ICD-10-CM

## 2021-05-05 DIAGNOSIS — I2510 Atherosclerotic heart disease of native coronary artery without angina pectoris: Secondary | ICD-10-CM

## 2021-05-05 NOTE — Progress Notes (Signed)
Office Visit Note   Patient: Joshua [name]           Date of Birth: 1956-10-23           MRN: [id number] Visit Date: 05/05/2021              Requested by: [name], MD [place],   [id number] PCP: [name], MD   Assessment & Plan: Visit Diagnoses: No diagnosis found.  Plan: We discussed portance of exercise program riding exercise bike, looking into water aerobics exercises available at the local gym.  With gradual weight loss he would improve canal adipose content.  This would also help his blood pressure, diabetes etc.  We discussed available weight loss clinic options as well.  He states he can work on this and I will recheck him in 6 months.  Follow-Up Instructions: Return in about 6 months (around 11/05/2021).   Orders:  No orders of the defined types were placed in this encounter.  No orders of the defined types were placed in this encounter.     Procedures: No procedures performed   Clinical Data: No additional findings.   Subjective: Chief Complaint  Patient presents with  . Lower Back - Pain    HPI 65 year old male with chronic low back pain worsened last year with neurogenic claudication symptoms problems walking more than a block or standing more than 10 minutes.  Patient's had diabetes in the past uncontrolled with A1c up to 15 for several years.  Recently has been 7.58.5 A1c.  He states he feels tired type feelings when he standing occasionally has some mild dyspnea.  He has neuropathy of his feet.  MRI scan has been obtained which showed multilevel degenerative changes lumbar spine with canal stenosis primarily due to prominent epidural fat.  He does have some facet arthropathy and some foraminal narrowing.  No single level of prominence.  Patient does have heart issues as well as insulin-dependent diabetes.  He states when he started on Trulicity he is done better.  Review of Systems positive for diabetes  polyneuropathy insulin-dependent, significant cardiac history combined systolic and diastolic heart failure problems.  All other systems noncontributory to HPI.   Objective: Vital Signs: BP (!) 159/81   Pulse 83   Ht $R'5\' 9"'gG$  (1.753 m)   Wt (!) 304 lb (137.9 kg)   BMI 44.89 kg/m   Physical Exam Constitutional:      Appearance: He is well-developed.  HENT:     Head: Normocephalic and atraumatic.  Eyes:     Pupils: Pupils are equal, round, and reactive to light.  Neck:     Thyroid: No thyromegaly.     Trachea: No tracheal deviation.  Cardiovascular:     Rate and Rhythm: Normal rate.  Pulmonary:     Effort: Pulmonary effort is normal.     Breath sounds: No wheezing.  Abdominal:     General: Bowel sounds are normal.     Palpations: Abdomen is soft.  Skin:    General: Skin is warm and dry.     Capillary Refill: Capillary refill takes less than 2 seconds.  Neurological:     Mental Status: He is alert and oriented to person, place, and time.  Psychiatric:        Behavior: Behavior normal.        Thought Content: Thought content normal.        Judgment: Judgment normal.     Ortho Exam anterior  tib EHL is intact he is able to heel and toe walk.  Some decreased balance issues good cervical range of motion reflexes are intact.  No sciatic notch tenderness. Specialty Comments:  No specialty comments available.  Imaging:  Narrative & Impression  CLINICAL DATA:  Chronic low back pain  EXAM: MRI LUMBAR SPINE WITHOUT CONTRAST  TECHNIQUE: Multiplanar, multisequence MR imaging of the lumbar spine was performed. No intravenous contrast was administered.  COMPARISON:  None.  FINDINGS: Segmentation:  Standard.  Alignment:  No significant listhesis.  Vertebrae: Nonspecific marrow heterogeneity. Degenerative endplate irregularity. Multilevel Schmorl's nodes. There is associated marrow edema the inferior L3 endplate.  Conus medullaris and cauda equina: Conus extends to  the L1 level. Conus and cauda equina appear normal.  Paraspinal and other soft tissues: Unremarkable.  Disc levels: Congenital narrowing of the spinal canal.  L1-L2: Disc bulge. Prominent epidural fat partially effacing the thecal sac. No foraminal stenosis.  L2-L3: Disc bulge. Prominent epidural fat partially effacing the thecal sac. Minor foraminal stenosis.  L3-L4: Disc bulge. Prominent epidural fat partially effacing the thecal sac. Moderate facet arthropathy with ligamentum flavum infolding. Moderate foraminal stenosis.  L4-L5: Disc bulge with punctate central annular fissure. Right greater than left moderate facet arthropathy with ligamentum flavum infolding. Moderate right and mild to moderate left foraminal stenosis.  L5-S1: Prominent epidural fat partially effacing the thecal sac. Mild right and marked left facet arthropathy. No right foraminal stenosis. Marked left foraminal stenosis.  IMPRESSION: Multilevel degenerative changes as detailed above. Multilevel canal stenosis is primarily due to prominent epidural fat. Facet arthropathy and foraminal narrowing are greatest at lower lumbar levels.   Electronically Signed   By: Macy Mis M.D.   On: 04/03/2021 10:38       PMFS History: Patient Active Problem List   Diagnosis Date Noted  . Chronic low back pain   . Hypokalemia 09/25/2020  . Type 2 diabetes mellitus with stage 3 chronic kidney disease ([place]) 09/24/2020  . Unstable angina ([place]) 08/17/2019  . Chest pain 08/16/2019  . NSTEMI (non-ST elevated myocardial infarction) ([place]) 04/06/2019  . ACS (acute coronary syndrome) ([place]) 04/06/2019  . Sepsis ([place]) 03/15/2018  . DKA (diabetic ketoacidoses) 03/15/2018  . Special screening for malignant neoplasms, colon 08/22/2017  . Acute renal failure superimposed on stage 3 chronic kidney disease ([place]) 05/19/2017  . Essential hypertension   . Status post transmetatarsal amputation of foot, right  ([place]) 11/08/2016  . Diabetic polyneuropathy associated with type 2 diabetes mellitus ([place]) 11/08/2016  . Pure hypercholesterolemia   . AKI (acute kidney injury) ([facility])   . Acute blood loss anemia   . DM type 2 with diabetic peripheral neuropathy ([place])   . Physical debility 08/03/2016  . Chronic osteomyelitis of right foot ([place])   . Cellulitis and abscess of leg, except foot   . Penetrating wound of right foot   . CKD (chronic kidney disease) stage 3, GFR 30-59 ml/min (HCC)   . Unstable angina pectoris ([place]) 07/18/2016  . Chronic combined systolic and diastolic congestive heart failure ([place])   . Chest pain with moderate risk for cardiac etiology 06/24/2014  . CAD S/P multiple PCIs 06/24/2014  . Sleep apnea- on C-pap 06/24/2014  . Morbid obesity due to excess calories ([place]) 11/10/2013  . Hyperglycemia 10/29/2013  . Hyperlipidemia 01/12/2011  . Benign essential HTN 01/12/2011  . GERD 01/12/2011   Past Medical History:  Diagnosis Date  . Anemia   . Chronic combined systolic and diastolic CHF (congestive heart failure) (  [place])    a. 07/2015 Echo: EF 45-50%, mod LVH, mid apical/antsept AK, Gr 1 DD.  [name] CKD (chronic kidney disease), stage III ([place])   . Coronary artery disease    a. 2012 s/p MI/PCI: s/p DES to mid/dist RCA and overlapping DES to LAD;  b. 07/2015 Cath: LAD 10% ISR, D1 40(jailed), LCX 110m, OM2 30, OM3 30, RCA 62m ISR, 10d ISR; c. 07/2016 NSTEMI/PCI: LAD 85ost/p/m (3.5x20 Synergy DES overlapping prior stent), D1 40, LCX 65m, OM2 95 (2.75x16 Synergy DES), OM3 30, RCA 24m, 10d.  . Depression   . Diabetic gastroparesis ([place])   . DKA (diabetic ketoacidoses) 03/14/2018  . GERD (gastroesophageal reflux disease)   . Gout   . Heart murmur   . Hyperlipemia   . Hypertension   . Hypertensive heart disease   . Ischemic cardiomyopathy    a. 07/2015 Echo: EF 45-50%, mod LVH, mid-apicalanteroseptal AK, Gr1 DD.  . Morbid obesity ([place])   . OSA on CPAP   . Osteomyelitis ([place])    a. 07/2016 MTP  joint of R great toe s/p transmetatarsal amputation.  . Polyneuropathy in diabetes(357.2)   . Pulmonary sarcoidosis ([place])    "problems w/it years ago; no problems anymore" (07/03/2014)  . Rectal bleeding   . Sleep apnea   . Type II diabetes mellitus (HCC)     Family History  Problem Relation Age of Onset  . Heart attack Maternal Aunt   . Diabetes Maternal Grandmother   . Hypertension Maternal Grandmother     Past Surgical History:  Procedure Laterality Date  . AMPUTATION Right 07/24/2016   Procedure: TRANSMETATARSAL FOOT AMPUTATION;  Surgeon: [name], MD;  Location: [facility];  Service: Orthopedics;  Laterality: Right;  . BALLOON DILATION N/A 03/21/2018   Procedure: BALLOON DILATION;  Surgeon: [name], MD;  Location: [facility] ENDOSCOPY;  Service: Endoscopy;  Laterality: N/A;  . BREAST LUMPECTOMY Right ~ 2000   "benign"  . CARDIAC CATHETERIZATION N/A 07/30/2015   Procedure: Left Heart Cath and Coronary Angiography;  Surgeon: Burnell Blanks, MD;  Location: [place] [facility] LAB;  Service: Cardiovascular;  Laterality: N/A;  . CARDIAC CATHETERIZATION N/A 07/19/2016   Procedure: Left Heart Cath and Coronary Angiography;  Surgeon: Belva Crome, MD;  Location: [facility];  Service: Cardiovascular;  Laterality: N/A;  . CARDIAC CATHETERIZATION N/A 07/29/2016   Procedure: Coronary Stent Intervention;  Surgeon: Belva Crome, MD;  Location: [place] CV LAB;  Service: Cardiovascular;  Laterality: N/A;  . CARDIAC CATHETERIZATION N/A 07/29/2016   Procedure: Intravascular Ultrasound/IVUS;  Surgeon: Belva Crome, MD;  Location: [place] CV LAB;  Service: Cardiovascular;  Laterality: N/A;  . CATARACT EXTRACTION W/ INTRAOCULAR LENS  IMPLANT, BILATERAL Bilateral 2014-2015  . COLONOSCOPY WITH PROPOFOL N/A 08/22/2017   Procedure: COLONOSCOPY WITH PROPOFOL;  Surgeon: [name], MD;  Location: [facility];  Service: Endoscopy;  Laterality: N/A;  . CORONARY ANGIOPLASTY WITH STENT  PLACEMENT  10/31/2011   30 % MID ATRIOVENTRICULAR GROOVE CX STENOSIS. 90 % MID RCA.PTA TO LAD/STENTING OF TANDEM 60-70%. LAD STENOSIS 99% REDUCED TO 0%.3.0 X 32MM PROMUS DES POSTDILATED TO 3.25MM.  [name] CORONARY ANGIOPLASTY WITH STENT PLACEMENT  07/03/2014   "2"  . CORONARY STENT INTERVENTION N/A 04/09/2019   Procedure: CORONARY STENT INTERVENTION;  Surgeon: Jettie Booze, MD;  Location: [facility];  Service: Cardiovascular;  Laterality: N/A;  . CORONARY STENT INTERVENTION N/A 08/22/2019   Procedure: CORONARY STENT INTERVENTION;  Surgeon: Martinique, Peter M, MD;  Location: [place] [facility] [facility];  Service: Cardiovascular;  Laterality: N/A;  . ESOPHAGOGASTRODUODENOSCOPY (EGD) WITH PROPOFOL N/A 03/21/2018   Procedure: ESOPHAGOGASTRODUODENOSCOPY (EGD) WITH PROPOFOL;  Surgeon: [name], MD;  Location: [facility];  Service: Endoscopy;  Laterality: N/A;  . I & D EXTREMITY Right 10/30/2013   Procedure: IRRIGATION AND DEBRIDEMENT RIGHT SMALL FINGER POSSIBLE SECONDARY WOUND CLOSURE;  Surgeon: [name], MD;  Location: [facility];  Service: Orthopedics;  Laterality: Right;  [name] is available after 4pm  . I & D EXTREMITY Right 11/01/2013   Procedure:  IRRIGATION AND DEBRIDEMENT RIGHT  PROXIMAL PHALANGEAL LEVEL AMPUTATION;  Surgeon: [name], MD;  Location: [facility];  Service: Orthopedics;  Laterality: Right;  . INCISION AND DRAINAGE Right 10/28/2013   Procedure: INCISION AND DRAINAGE Right small finger;  Surgeon: [name], MD;  Location: [facility];  Service: Orthopedics;  Laterality: Right;  . LEFT HEART CATH Left 11/03/2011   Procedure: LEFT HEART CATH;  Surgeon: [name], MD;  Location: [facility] CATH LAB;  Service: Cardiovascular;  Laterality: Left;  . LEFT HEART CATH AND CORONARY ANGIOGRAPHY N/A 04/09/2019   Procedure: LEFT HEART CATH AND CORONARY ANGIOGRAPHY;  Surgeon: Jettie Booze, MD;  Location: [place] CV LAB;  Service: Cardiovascular;  Laterality: N/A;  .  LEFT HEART CATHETERIZATION WITH CORONARY ANGIOGRAM N/A 07/03/2014   Procedure: LEFT HEART CATHETERIZATION WITH CORONARY ANGIOGRAM;  Surgeon: [name], MD;  Location: [facility] CATH LAB;  Service: Cardiovascular;  Laterality: N/A;  . PERCUTANEOUS CORONARY STENT INTERVENTION (PCI-S) Left 11/03/2011   Procedure: PERCUTANEOUS CORONARY STENT INTERVENTION (PCI-S);  Surgeon: [name], MD;  Location: [facility] CATH LAB;  Service: Cardiovascular;  Laterality: Left;  . RIGHT/LEFT HEART CATH AND CORONARY ANGIOGRAPHY N/A 08/22/2019   Procedure: RIGHT/LEFT HEART CATH AND CORONARY ANGIOGRAPHY;  Surgeon: Martinique, Peter M, MD;  Location: [place] CV LAB;  Service: Cardiovascular;  Laterality: N/A;  . TOE AMPUTATION Right ~ 2010   "2nd toe"  . TOE AMPUTATION Right 2012   "3rd toe"   Social History   Occupational History  . Not on file  Tobacco Use  . Smoking status: Never Smoker  . Smokeless tobacco: Never Used  Vaping Use  . Vaping Use: Never used  Substance and Sexual Activity  . Alcohol use: Yes    Comment: social   . Drug use: No  . Sexual activity: Yes

## 2021-05-05 NOTE — Telephone Encounter (Signed)
Rx have already been refilled on 05/03/2021

## 2021-05-12 ENCOUNTER — Other Ambulatory Visit: Payer: Self-pay

## 2021-05-12 ENCOUNTER — Ambulatory Visit: Payer: Medicare Other | Admitting: Physical Therapy

## 2021-05-12 ENCOUNTER — Encounter: Payer: Self-pay | Admitting: Physical Therapy

## 2021-05-12 DIAGNOSIS — R296 Repeated falls: Secondary | ICD-10-CM | POA: Diagnosis not present

## 2021-05-12 DIAGNOSIS — R262 Difficulty in walking, not elsewhere classified: Secondary | ICD-10-CM

## 2021-05-12 DIAGNOSIS — R600 Localized edema: Secondary | ICD-10-CM

## 2021-05-12 DIAGNOSIS — M6281 Muscle weakness (generalized): Secondary | ICD-10-CM

## 2021-05-12 NOTE — Therapy (Signed)
[facility]. [place], [place], [address] Phone: [phone]   Fax:  [phone]  Physical Therapy Treatment  Patient Details  Name: Joshua [name] MRN: [id number] Date of Birth: 04-18-1956 Referring Provider (PT): Benjiman Core, [place]   Encounter Date: 05/12/2021   PT End of Session - 05/12/21 1051    Visit Number 2    Date for PT Re-Evaluation 07/24/21    PT Start Time 5427    PT Stop Time 1055    PT Time Calculation (min) 41 min    Activity Tolerance Patient tolerated treatment well    Behavior During Therapy [facility] for tasks assessed/performed           Past Medical History:  Diagnosis Date  . Anemia   . Chronic combined systolic and diastolic CHF (congestive heart failure) ([place])    a. 07/2015 Echo: EF 45-50%, mod LVH, mid apical/antsept AK, Gr 1 DD.  [name] CKD (chronic kidney disease), stage III ([place])   . Coronary artery disease    a. 2012 s/p MI/PCI: s/p DES to mid/dist RCA and overlapping DES to LAD;  b. 07/2015 Cath: LAD 10% ISR, D1 40(jailed), LCX 9m, OM2 30, OM3 30, RCA 75m ISR, 10d ISR; c. 07/2016 NSTEMI/PCI: LAD 85ost/p/m (3.5x20 Synergy DES overlapping prior stent), D1 40, LCX 7m, OM2 95 (2.75x16 Synergy DES), OM3 30, RCA 7m, 10d.  . Depression   . Diabetic gastroparesis ([place])   . DKA (diabetic ketoacidoses) 03/14/2018  . GERD (gastroesophageal reflux disease)   . Gout   . Heart murmur   . Hyperlipemia   . Hypertension   . Hypertensive heart disease   . Ischemic cardiomyopathy    a. 07/2015 Echo: EF 45-50%, mod LVH, mid-apicalanteroseptal AK, Gr1 DD.  . Morbid obesity ([place])   . OSA on CPAP   . Osteomyelitis ([place])    a. 07/2016 MTP joint of R great toe s/p transmetatarsal amputation.  . Polyneuropathy in diabetes(357.2)   . Pulmonary sarcoidosis ([place])    "problems w/it years ago; no problems anymore" (07/03/2014)  . Rectal bleeding   . Sleep apnea   . Type II diabetes mellitus ([place])     Past  Surgical History:  Procedure Laterality Date  . AMPUTATION Right 07/24/2016   Procedure: TRANSMETATARSAL FOOT AMPUTATION;  Surgeon: [name], MD;  Location: [place];  Service: Orthopedics;  Laterality: Right;  . BALLOON DILATION N/A 03/21/2018   Procedure: BALLOON DILATION;  Surgeon:  Lobo, MD;  Location: [facility] ENDOSCOPY;  Service: Endoscopy;  Laterality: N/A;  . BREAST LUMPECTOMY Right ~ 2000   "benign"  . CARDIAC CATHETERIZATION N/A 07/30/2015   Procedure: Left Heart Cath and Coronary Angiography;  Surgeon: Burnell Blanks, MD;  Location: [place] CV LAB;  Service: Cardiovascular;  Laterality: N/A;  . CARDIAC CATHETERIZATION N/A 07/19/2016   Procedure: Left Heart Cath and Coronary Angiography;  Surgeon: Belva Crome, MD;  Location: [place] CV LAB;  Service: Cardiovascular;  Laterality: N/A;  . CARDIAC CATHETERIZATION N/A 07/29/2016   Procedure: Coronary Stent Intervention;  Surgeon: Belva Crome, MD;  Location: [place] CV LAB;  Service: Cardiovascular;  Laterality: N/A;  . CARDIAC CATHETERIZATION N/A 07/29/2016   Procedure: Intravascular Ultrasound/IVUS;  Surgeon: Belva Crome, MD;  Location: [place] CV LAB;  Service: Cardiovascular;  Laterality: N/A;  . CATARACT EXTRACTION W/ INTRAOCULAR LENS  IMPLANT, BILATERAL Bilateral 2014-2015  . COLONOSCOPY WITH PROPOFOL N/A 08/22/2017   Procedure: COLONOSCOPY WITH PROPOFOL;  Surgeon: [name], MD;  Location: [place];  Service: Endoscopy;  Laterality: N/A;  . CORONARY ANGIOPLASTY WITH STENT PLACEMENT  10/31/2011   30 % MID ATRIOVENTRICULAR GROOVE CX STENOSIS. 90 % MID RCA.PTA TO LAD/STENTING OF TANDEM 60-70%. LAD STENOSIS 99% REDUCED TO 0%.3.0 X 32MM PROMUS DES POSTDILATED TO 3.25MM.  [name] CORONARY ANGIOPLASTY WITH STENT PLACEMENT  07/03/2014   "2"  . CORONARY STENT INTERVENTION N/A 04/09/2019   Procedure: CORONARY STENT INTERVENTION;  Surgeon: Jettie Booze, MD;  Location: [place] CV LAB;  Service:  Cardiovascular;  Laterality: N/A;  . CORONARY STENT INTERVENTION N/A 08/22/2019   Procedure: CORONARY STENT INTERVENTION;  Surgeon: Martinique, Peter M, MD;  Location: [place] CV LAB;  Service: Cardiovascular;  Laterality: N/A;  . ESOPHAGOGASTRODUODENOSCOPY (EGD) WITH PROPOFOL N/A 03/21/2018   Procedure: ESOPHAGOGASTRODUODENOSCOPY (EGD) WITH PROPOFOL;  Surgeon:  Lobo, MD;  Location: [place];  Service: Endoscopy;  Laterality: N/A;  . I & D EXTREMITY Right 10/30/2013   Procedure: IRRIGATION AND DEBRIDEMENT RIGHT SMALL FINGER POSSIBLE SECONDARY WOUND CLOSURE;  Surgeon: [name], MD;  [facility];  Service: Orthopedics;  Laterality: Right;  [name] is available after 4pm  . I & D EXTREMITY Right 11/01/2013   Procedure:  IRRIGATION AND DEBRIDEMENT RIGHT  PROXIMAL PHALANGEAL LEVEL AMPUTATION;  Surgeon: [name], MD;  [facility];  Service: Orthopedics;  Laterality: Right;  . INCISION AND DRAINAGE Right 10/28/2013   Procedure: INCISION AND DRAINAGE Right small finger;  Surgeon: [name], MD;  [facility];  Service: Orthopedics;  Laterality: Right;  . LEFT HEART CATH Left 11/03/2011   Procedure: LEFT HEART CATH;  Surgeon: [name], MD;  Location: [facility] CATH LAB;  Service: Cardiovascular;  Laterality: Left;  . LEFT HEART CATH AND CORONARY ANGIOGRAPHY N/A 04/09/2019   Procedure: LEFT HEART CATH AND CORONARY ANGIOGRAPHY;  Surgeon: Jettie Booze, MD;  Location: [place] CV LAB;  Service: Cardiovascular;  Laterality: N/A;  . LEFT HEART CATHETERIZATION WITH CORONARY ANGIOGRAM N/A 07/03/2014   Procedure: LEFT HEART CATHETERIZATION WITH CORONARY ANGIOGRAM;  Surgeon: [name], MD;  Location: [facility] CATH LAB;  Service: Cardiovascular;  Laterality: N/A;  . PERCUTANEOUS CORONARY STENT INTERVENTION (PCI-S) Left 11/03/2011   Procedure: PERCUTANEOUS CORONARY STENT INTERVENTION (PCI-S);  Surgeon: [name], MD;  Location: [facility] CATH LAB;  Service:  Cardiovascular;  Laterality: Left;  . RIGHT/LEFT HEART CATH AND CORONARY ANGIOGRAPHY N/A 08/22/2019   Procedure: RIGHT/LEFT HEART CATH AND CORONARY ANGIOGRAPHY;  Surgeon: Martinique, Peter M, MD;  Location: [place] CV LAB;  Service: Cardiovascular;  Laterality: N/A;  . TOE AMPUTATION Right ~ 2010   "2nd toe"  . TOE AMPUTATION Right 2012   "3rd toe"    There were no vitals filed for this visit.   Subjective Assessment - 05/12/21 1015    Subjective Back is sore today, increased with activity    Currently in Pain? Yes    Pain Score 5     Pain Location Back                             [facility] Adult PT Treatment/Exercise - 05/12/21 0001      Exercises   Exercises Lumbar      Lumbar Exercises: Aerobic   Nustep L4 x 6 min      Lumbar Exercises: Machines for Strengthening   Cybex Knee Flexion 35lb 2x10      Lumbar  Exercises: Standing   Row Theraband;20 reps;Both    Theraband Level (Row) Level 3 (Green)    Shoulder Extension Theraband;20 reps;Both;Strengthening    Theraband Level (Shoulder Extension) Level 3 (Green)    Other Standing Lumbar Exercises Hip Ext x10      Lumbar Exercises: Seated   Sit to Stand 5 reps   x2, Mat table slightly elevated   Other Seated Lumbar Exercises Seated march core engagement 2x10    Other Seated Lumbar Exercises Ab set 2x10                    PT Short Term Goals - 05/04/21 1346      PT SHORT TERM GOAL #1   Title Patient will be independent with initial HEP.    Time 4    Period Weeks    Status New             PT Long Term Goals - 05/04/21 1347      PT LONG TERM GOAL #1   Title Patient will be independent with advanced HEP.    Time 10    Period Weeks    Status New      PT LONG TERM GOAL #2   Title Patient will improve TUG to 13 sec for reduced fall risk.    Time 12    Period Weeks    Status New      PT LONG TERM GOAL #3   Title Patient will increase BERG to at least 48/56 to place him at a low risk of  falling    Baseline 23/56    Time 12    Period Weeks    Status New      PT LONG TERM GOAL #4   Title Patient will increase functional LE and lumbar strength to at least 5/5 to allow him to more easily perform transfers and ambulation.    Time 12    Period Weeks    Status New                 Plan - 05/12/21 1052    Clinical Impression Statement Pt tolerated an initial progression to TW well. Cues needed to come to full standing with sit to stands. Tactile cues to prevent forward trunk flexion with shoulder extensions. LE fatigue reported with hip extensions. Pt stated he could feel a tug in his lower back on R side with seated marches.    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 complexity    Rehab Potential Good    PT Frequency 2x / week    PT Duration 12 weeks    PT Treatment/Interventions ADLs/Self Care Home Management;Cryotherapy;Electrical Stimulation;Iontophoresis 4mg /ml Dexamethasone;Moist Heat;Traction;Ultrasound;Gait training;Stair training;Therapeutic activities;Therapeutic exercise;Balance training;Neuromuscular re-education;Dry needling;Passive range of motion;Spinal Manipulations;Joint Manipulations           Patient will benefit from skilled therapeutic intervention in order to improve the following deficits and impairments:  Difficulty walking,Decreased endurance,Increased muscle spasms,Obesity,Decreased activity tolerance,Pain,Decreased balance,Impaired flexibility,Decreased strength,Postural dysfunction  Visit Diagnosis: Muscle weakness (generalized)  Difficulty in walking, not elsewhere classified  Localized edema  Repeated falls     Problem List Patient Active Problem List   Diagnosis Date Noted  . Chronic low back pain   .  Hypokalemia 09/25/2020  . Type 2 diabetes mellitus with stage 3 chronic kidney disease ([place]) 09/24/2020  . Unstable angina ([place]) 08/17/2019  .  Chest pain 08/16/2019  . NSTEMI (non-ST elevated myocardial infarction) ([place]) 04/06/2019  . ACS (acute coronary syndrome) ([place]) 04/06/2019  . Sepsis ([place]) 03/15/2018  . DKA (diabetic ketoacidoses) 03/15/2018  . Special screening for malignant neoplasms, colon 08/22/2017  . Acute renal failure superimposed on stage 3 chronic kidney disease ([place]) 05/19/2017  . Essential hypertension   . Status post transmetatarsal amputation of foot, right ([place]) 11/08/2016  . Diabetic polyneuropathy associated with type 2 diabetes mellitus ([place]) 11/08/2016  . Pure hypercholesterolemia   . AKI (acute kidney injury) ([name])   . Acute blood loss anemia   . DM type 2 with diabetic peripheral neuropathy ([place])   . Physical debility 08/03/2016  . Chronic osteomyelitis of right foot ([place])   . Cellulitis and abscess of leg, except foot   . Penetrating wound of right foot   . CKD (chronic kidney disease) stage 3, GFR 30-59 ml/min (HCC)   . Unstable angina pectoris ([place]) 07/18/2016  . Chronic combined systolic and diastolic congestive heart failure ([place])   . Chest pain with moderate risk for cardiac etiology 06/24/2014  . CAD S/P multiple PCIs 06/24/2014  . Sleep apnea- on C-pap 06/24/2014  . Morbid obesity due to excess calories ([place]) 11/10/2013  . Hyperglycemia 10/29/2013  . Hyperlipidemia 01/12/2011  . Benign essential HTN 01/12/2011  . GERD 01/12/2011    Scot Jun 05/12/2021, 10:55 AM  [place]. [place], [place], [address] Phone: [phone]   Fax:  [phone]  Name: Joshua [name] MRN: [id number] Date of Birth: 14-Nov-1956

## 2021-05-14 ENCOUNTER — Other Ambulatory Visit: Payer: Self-pay

## 2021-05-14 ENCOUNTER — Ambulatory Visit: Payer: Medicare Other | Attending: Physician Assistant | Admitting: Physical Therapy

## 2021-05-14 DIAGNOSIS — M6281 Muscle weakness (generalized): Secondary | ICD-10-CM | POA: Diagnosis present

## 2021-05-14 DIAGNOSIS — M545 Low back pain, unspecified: Secondary | ICD-10-CM | POA: Diagnosis present

## 2021-05-14 DIAGNOSIS — R296 Repeated falls: Secondary | ICD-10-CM | POA: Diagnosis present

## 2021-05-14 DIAGNOSIS — R262 Difficulty in walking, not elsewhere classified: Secondary | ICD-10-CM | POA: Diagnosis not present

## 2021-05-14 DIAGNOSIS — R600 Localized edema: Secondary | ICD-10-CM | POA: Diagnosis present

## 2021-05-14 DIAGNOSIS — G8929 Other chronic pain: Secondary | ICD-10-CM | POA: Diagnosis present

## 2021-05-14 NOTE — Therapy (Signed)
[facility] [place]. [place], [place], [address] Phone: [phone]   Fax:  [phone]  Physical Therapy Treatment  Patient Details  Name: Joshua [name] MRN: [id number] Date of Birth: 15-Apr-1956 Referring Provider (PT): Benjiman Core, [place]   Encounter Date: 05/14/2021   PT End of Session - 05/14/21 0836    Visit Number 3    Date for PT Re-Evaluation 07/24/21    PT Start Time 9924    PT Stop Time 0838    PT Time Calculation (min) 41 min           Past Medical History:  Diagnosis Date  . Anemia   . Chronic combined systolic and diastolic CHF (congestive heart failure) ([place])    a. 07/2015 Echo: EF 45-50%, mod LVH, mid apical/antsept AK, Gr 1 DD.  [name] CKD (chronic kidney disease), stage III ([place])   . Coronary artery disease    a. 2012 s/p MI/PCI: s/p DES to mid/dist RCA and overlapping DES to LAD;  b. 07/2015 Cath: LAD 10% ISR, D1 40(jailed), LCX 29m OM2 30, OM3 30, RCA 541mSR, 10d ISR; c. 07/2016 NSTEMI/PCI: LAD 85ost/p/m (3.5x20 Synergy DES overlapping prior stent), D1 40, LCX 7561mM2 95 (2.75x16 Synergy DES), OM3 30, RCA 27m87md.  . Depression   . Diabetic gastroparesis (HCC)[place]. DKA (diabetic ketoacidoses) 03/14/2018  . GERD (gastroesophageal reflux disease)   . Gout   . Heart murmur   . Hyperlipemia   . Hypertension   . Hypertensive heart disease   . Ischemic cardiomyopathy    a. 07/2015 Echo: EF 45-50%, mod LVH, mid-apicalanteroseptal AK, Gr1 DD.  . Morbid obesity (HCC)[place]. OSA on CPAP   . Osteomyelitis (HCC)[place] a. 07/2016 MTP joint of R great toe s/p transmetatarsal amputation.  . Polyneuropathy in diabetes(357.2)   . Pulmonary sarcoidosis (HCC)[place] "problems w/it years ago; no problems anymore" (07/03/2014)  . Rectal bleeding   . Sleep apnea   . Type II diabetes mellitus (HCC)[place]  Past Surgical History:  Procedure Laterality Date  . AMPUTATION Right 07/24/2016   Procedure: TRANSMETATARSAL FOOT AMPUTATION;   Surgeon: MarcNewt Minion;  [facility];  Laterality: Right;  . BALLOON DILATION N/A 03/21/2018   Procedure: BALLOON DILATION;  Surgeon: BuccRonald Lobo;  [facility];  Service: Endoscopy;  Laterality: N/A;  . BREAST LUMPECTOMY Right ~ 2000   "benign"  . CARDIAC CATHETERIZATION N/A 07/30/2015   Procedure: Left Heart Cath and Coronary Angiography;  Surgeon: ChriBurnell Blanks;  [facility];  Service: Cardiovascular;  Laterality: N/A;  . CARDIAC CATHETERIZATION N/A 07/19/2016   Procedure: Left Heart Cath and Coronary Angiography;  Surgeon: HenrBelva Crome;  [facility];  Service: Cardiovascular;  Laterality: N/A;  . CARDIAC CATHETERIZATION N/A 07/29/2016   Procedure: Coronary Stent Intervention;  Surgeon: HenrBelva Crome;  [facility];  Service: Cardiovascular;  Laterality: N/A;  . CARDIAC CATHETERIZATION N/A 07/29/2016   Procedure: Intravascular Ultrasound/IVUS;  Surgeon: HenrBelva Crome;  [facility];  Service: Cardiovascular;  Laterality: N/A;  . CATARACT EXTRACTION W/ INTRAOCULAR LENS  IMPLANT, BILATERAL Bilateral 2014-2015  . COLONOSCOPY WITH PROPOFOL N/A 08/22/2017   Procedure: COLONOSCOPY WITH PROPOFOL;  Surgeon: SchoWilford Corner;  [facility];  Laterality: N/A;  . CORONARY ANGIOPLASTY WITH  STENT PLACEMENT  10/31/2011   30 % MID ATRIOVENTRICULAR GROOVE CX STENOSIS. 90 % MID RCA.PTA TO LAD/STENTING OF TANDEM 60-70%. LAD STENOSIS 99% REDUCED TO 0%.3.0 X 32MM PROMUS DES POSTDILATED TO 3.25MM.  [name] CORONARY ANGIOPLASTY WITH STENT PLACEMENT  07/03/2014   "2"  . CORONARY STENT INTERVENTION N/A 04/09/2019   Procedure: CORONARY STENT INTERVENTION;  Surgeon: Jettie Booze, MD;  Location: [place] CV LAB;  Service: Cardiovascular;  Laterality: N/A;  . CORONARY STENT INTERVENTION N/A 08/22/2019   Procedure: CORONARY STENT INTERVENTION;   Surgeon: Martinique, Peter M, MD;  Location: [place] CV LAB;  Service: Cardiovascular;  Laterality: N/A;  . ESOPHAGOGASTRODUODENOSCOPY (EGD) WITH PROPOFOL N/A 03/21/2018   Procedure: ESOPHAGOGASTRODUODENOSCOPY (EGD) WITH PROPOFOL;  Surgeon: [name], MD;  Location: [place];  Service: Endoscopy;  Laterality: N/A;  . I & D EXTREMITY Right 10/30/2013   Procedure: IRRIGATION AND DEBRIDEMENT RIGHT SMALL FINGER POSSIBLE SECONDARY WOUND CLOSURE;  Surgeon: [name], MD;  [facility];  Service: Orthopedics;  Laterality: Right;  [name] is available after 4pm  . I & D EXTREMITY Right 11/01/2013   Procedure:  IRRIGATION AND DEBRIDEMENT RIGHT  PROXIMAL PHALANGEAL LEVEL AMPUTATION;  Surgeon: [name], MD;  [facility];  Service: Orthopedics;  Laterality: Right;  . INCISION AND DRAINAGE Right 10/28/2013   Procedure: INCISION AND DRAINAGE Right small finger;  Surgeon: [name], MD;  [facility];  Service: Orthopedics;  Laterality: Right;  . LEFT HEART CATH Left 11/03/2011   Procedure: LEFT HEART CATH;  Surgeon: [name], MD;  [facility] LAB;  Service: Cardiovascular;  Laterality: Left;  . LEFT HEART CATH AND CORONARY ANGIOGRAPHY N/A 04/09/2019   Procedure: LEFT HEART CATH AND CORONARY ANGIOGRAPHY;  Surgeon: Jettie Booze, MD;  Location: [place] CV LAB;  Service: Cardiovascular;  Laterality: N/A;  . LEFT HEART CATHETERIZATION WITH CORONARY ANGIOGRAM N/A 07/03/2014   Procedure: LEFT HEART CATHETERIZATION WITH CORONARY ANGIOGRAM;  Surgeon: [name], MD;  Location: [facility] CATH LAB;  Service: Cardiovascular;  Laterality: N/A;  . PERCUTANEOUS CORONARY STENT INTERVENTION (PCI-S) Left 11/03/2011   Procedure: PERCUTANEOUS CORONARY STENT INTERVENTION (PCI-S);  Surgeon: [name], MD;  Location: [facility] CATH LAB;  Service: Cardiovascular;  Laterality: Left;  . RIGHT/LEFT HEART CATH AND CORONARY ANGIOGRAPHY N/A 08/22/2019   Procedure: RIGHT/LEFT  HEART CATH AND CORONARY ANGIOGRAPHY;  Surgeon: Martinique, Peter M, MD;  Location: [place] CV LAB;  Service: Cardiovascular;  Laterality: N/A;  . TOE AMPUTATION Right ~ 2010   "2nd toe"  . TOE AMPUTATION Right 2012   "3rd toe"    There were no vitals filed for this visit.   Subjective Assessment - 05/14/21 0759    Subjective sore and stiff LB to feet    Currently in Pain? Yes    Pain Score 5     Pain Location Back                             [facility] Adult PT Treatment/Exercise - 05/14/21 0001      Lumbar Exercises: Stretches   Active Hamstring Stretch Right;Left;3 reps;10 seconds      Lumbar Exercises: Aerobic   UBE (Upper Arm Bike) L 2 2 min fwd/2 min back   STANDING   Nustep L 5 30mn      Lumbar Exercises: Standing   Row Strengthening;Power tower;20 reps   10# pulleys   Shoulder Extension  Strengthening;Power Tower;20 reps   10# pulleys   Other Standing Lumbar Exercises 5# modified dead lift    Other Standing Lumbar Exercises hip ext 10x BIL   trunk ext 15 x black tband     Lumbar Exercises: Seated   Sit to Stand 10 reps   wt ball chest press- weakness with full ext of back and some use of LE on mat     Manual Therapy   Manual Therapy Passive ROM    Passive ROM LE and trunk                    PT Short Term Goals - 05/14/21 0837      PT SHORT TERM GOAL #1   Title Patient will be independent with initial HEP.    Status Achieved             PT Long Term Goals - 05/04/21 1347      PT LONG TERM GOAL #1   Title Patient will be independent with advanced HEP.    Time 10    Period Weeks    Status New      PT LONG TERM GOAL #2   Title Patient will improve TUG to 13 sec for reduced fall risk.    Time 12    Period Weeks    Status New      PT LONG TERM GOAL #3   Title Patient will increase BERG to at least 48/56 to place him at a low risk of falling    Baseline 23/56    Time 12    Period Weeks    Status New      PT LONG TERM GOAL  #4   Title Patient will increase functional LE and lumbar strength to at least 5/5 to allow him to more easily perform transfers and ambulation.    Time 12    Period Weeks    Status New                 Plan - 05/14/21 3299    Clinical Impression Statement muscles weakness noted in LE and core with ex and cues to engage and SBA for stability. some pain in LB as he moved flex to ext or revers noted with STS and trunk ext. Pt very tight and felt relief after PROM - stressed need to stretch at home. STG met. educ on log rollig to protect back    PT Treatment/Interventions ADLs/Self Care Home Management;Cryotherapy;Electrical Stimulation;Iontophoresis 17m/ml Dexamethasone;Moist Heat;Traction;Ultrasound;Gait training;Stair training;Therapeutic activities;Therapeutic exercise;Balance training;Neuromuscular re-education;Dry needling;Passive range of motion;Spinal Manipulations;Joint Manipulations    PT Next Visit Plan core strength and flexibilty           Patient will benefit from skilled therapeutic intervention in order to improve the following deficits and impairments:  Difficulty walking,Decreased endurance,Increased muscle spasms,Obesity,Decreased activity tolerance,Pain,Decreased balance,Impaired flexibility,Decreased strength,Postural dysfunction  Visit Diagnosis: Difficulty in walking, not elsewhere classified  Muscle weakness (generalized)     Problem List Patient Active Problem List   Diagnosis Date Noted  . Chronic low back pain   . Hypokalemia 09/25/2020  . Type 2 diabetes mellitus with stage 3 chronic kidney disease ([place] 09/24/2020  . Unstable angina ([place] 08/17/2019  . Chest pain 08/16/2019  . NSTEMI (non-ST elevated myocardial infarction) ([place] 04/06/2019  . ACS (acute coronary syndrome) ([place] 04/06/2019  . Sepsis ([place] 03/15/2018  . DKA (diabetic ketoacidoses) 03/15/2018  . Special screening for malignant neoplasms, colon 08/22/2017  . Acute renal failure  superimposed on stage 3 chronic kidney disease ([place]) 05/19/2017  . Essential hypertension   . Status post transmetatarsal amputation of foot, right ([place]) 11/08/2016  . Diabetic polyneuropathy associated with type 2 diabetes mellitus ([place]) 11/08/2016  . Pure hypercholesterolemia   . AKI (acute kidney injury) ([place])   . Acute blood loss anemia   . DM type 2 with diabetic peripheral neuropathy ([place])   . Physical debility 08/03/2016  . Chronic osteomyelitis of right foot ([place])   . Cellulitis and abscess of leg, except foot   . Penetrating wound of right foot   . CKD (chronic kidney disease) stage 3, GFR 30-59 ml/min (HCC)   . Unstable angina pectoris ([place]) 07/18/2016  . Chronic combined systolic and diastolic congestive heart failure ([place])   . Chest pain with moderate risk for cardiac etiology 06/24/2014  . CAD S/P multiple PCIs 06/24/2014  . Sleep apnea- on C-pap 06/24/2014  . Morbid obesity due to excess calories ([place]) 11/10/2013  . Hyperglycemia 10/29/2013  . Hyperlipidemia 01/12/2011  . Benign essential HTN 01/12/2011  . GERD 01/12/2011    Lesha Jager,ANGIE PTA 05/14/2021, 8:42 AM  [place]. [place], [place], [address] Phone: [phone]   Fax:  [phone]  Name: Joshua [name] MRN: [id number] Date of Birth: [birth year]

## 2021-05-19 ENCOUNTER — Ambulatory Visit: Payer: Medicare Other | Admitting: Physical Therapy

## 2021-05-19 ENCOUNTER — Encounter: Payer: Self-pay | Admitting: Physical Therapy

## 2021-05-19 ENCOUNTER — Other Ambulatory Visit: Payer: Self-pay

## 2021-05-19 DIAGNOSIS — R262 Difficulty in walking, not elsewhere classified: Secondary | ICD-10-CM | POA: Diagnosis not present

## 2021-05-19 DIAGNOSIS — R600 Localized edema: Secondary | ICD-10-CM

## 2021-05-19 DIAGNOSIS — R296 Repeated falls: Secondary | ICD-10-CM

## 2021-05-19 DIAGNOSIS — M6281 Muscle weakness (generalized): Secondary | ICD-10-CM

## 2021-05-19 DIAGNOSIS — G8929 Other chronic pain: Secondary | ICD-10-CM

## 2021-05-19 NOTE — Therapy (Signed)
[place] [place], [place], [address] Phone: [phone]   Fax:  [phone]  Physical Therapy Treatment  Patient Details  Name: Joshua [name] MRN: [id number] Date of Birth: 13-Jan-1956 Referring Provider (PT): Benjiman Core, [place]   Encounter Date: 05/19/2021   PT End of Session - 05/19/21 1051    Visit Number 4    Date for PT Re-Evaluation 07/24/21    PT Start Time 3976    PT Stop Time 1058    PT Time Calculation (min) 43 min    Activity Tolerance Patient tolerated treatment well    Behavior During Therapy [facility] for tasks assessed/performed           Past Medical History:  Diagnosis Date  . Anemia   . Chronic combined systolic and diastolic CHF (congestive heart failure) ([place])    a. 07/2015 Echo: EF 45-50%, mod LVH, mid apical/antsept AK, Gr 1 DD.  [name] CKD (chronic kidney disease), stage III ([place])   . Coronary artery disease    a. 2012 s/p MI/PCI: s/p DES to mid/dist RCA and overlapping DES to LAD;  b. 07/2015 Cath: LAD 10% ISR, D1 40(jailed), LCX 61m, OM2 30, OM3 30, RCA 79m ISR, 10d ISR; c. 07/2016 NSTEMI/PCI: LAD 85ost/p/m (3.5x20 Synergy DES overlapping prior stent), D1 40, LCX 8m, OM2 95 (2.75x16 Synergy DES), OM3 30, RCA 71m, 10d.  . Depression   . Diabetic gastroparesis ([place])   . DKA (diabetic ketoacidoses) 03/14/2018  . GERD (gastroesophageal reflux disease)   . Gout   . Heart murmur   . Hyperlipemia   . Hypertension   . Hypertensive heart disease   . Ischemic cardiomyopathy    a. 07/2015 Echo: EF 45-50%, mod LVH, mid-apicalanteroseptal AK, Gr1 DD.  . Morbid obesity ([place])   . OSA on CPAP   . Osteomyelitis ([place])    a. 07/2016 MTP joint of R great toe s/p transmetatarsal amputation.  . Polyneuropathy in diabetes(357.2)   . Pulmonary sarcoidosis ([place])    "problems w/it years ago; no problems anymore" (07/03/2014)  . Rectal bleeding   . Sleep apnea   . Type II diabetes mellitus ([place])     Past  Surgical History:  Procedure Laterality Date  . AMPUTATION Right 07/24/2016   Procedure: TRANSMETATARSAL FOOT AMPUTATION;  Surgeon: [name], MD;  Location: [place];  Service: Orthopedics;  Laterality: Right;  . BALLOON DILATION N/A 03/21/2018   Procedure: BALLOON DILATION;  Surgeon:  Lobo, MD;  Location: [facility] ENDOSCOPY;  Service: Endoscopy;  Laterality: N/A;  . BREAST LUMPECTOMY Right ~ 2000   "benign"  . CARDIAC CATHETERIZATION N/A 07/30/2015   Procedure: Left Heart Cath and Coronary Angiography;  Surgeon: Burnell Blanks, MD;  Location: [place] CV LAB;  Service: Cardiovascular;  Laterality: N/A;  . CARDIAC CATHETERIZATION N/A 07/19/2016   Procedure: Left Heart Cath and Coronary Angiography;  Surgeon: Belva Crome, MD;  Location: [place] CV LAB;  Service: Cardiovascular;  Laterality: N/A;  . CARDIAC CATHETERIZATION N/A 07/29/2016   Procedure: Coronary Stent Intervention;  Surgeon: Belva Crome, MD;  Location: [place] CV LAB;  Service: Cardiovascular;  Laterality: N/A;  . CARDIAC CATHETERIZATION N/A 07/29/2016   Procedure: Intravascular Ultrasound/IVUS;  Surgeon: Belva Crome, MD;  Location: [place] CV LAB;  Service: Cardiovascular;  Laterality: N/A;  . CATARACT EXTRACTION W/ INTRAOCULAR LENS  IMPLANT, BILATERAL Bilateral 2014-2015  . COLONOSCOPY WITH PROPOFOL N/A 08/22/2017   Procedure: COLONOSCOPY WITH PROPOFOL;  Surgeon: [name], MD;  Location: [place];  Service: Endoscopy;  Laterality: N/A;  . CORONARY ANGIOPLASTY WITH STENT PLACEMENT  10/31/2011   30 % MID ATRIOVENTRICULAR GROOVE CX STENOSIS. 90 % MID RCA.PTA TO LAD/STENTING OF TANDEM 60-70%. LAD STENOSIS 99% REDUCED TO 0%.3.0 X 32MM PROMUS DES POSTDILATED TO 3.25MM.  [name] CORONARY ANGIOPLASTY WITH STENT PLACEMENT  07/03/2014   "2"  . CORONARY STENT INTERVENTION N/A 04/09/2019   Procedure: CORONARY STENT INTERVENTION;  Surgeon: Jettie Booze, MD;  Location: [place] CV LAB;  Service:  Cardiovascular;  Laterality: N/A;  . CORONARY STENT INTERVENTION N/A 08/22/2019   Procedure: CORONARY STENT INTERVENTION;  Surgeon: Martinique, Peter M, MD;  Location: [place] CV LAB;  Service: Cardiovascular;  Laterality: N/A;  . ESOPHAGOGASTRODUODENOSCOPY (EGD) WITH PROPOFOL N/A 03/21/2018   Procedure: ESOPHAGOGASTRODUODENOSCOPY (EGD) WITH PROPOFOL;  Surgeon:  Lobo, MD;  Location: [place];  Service: Endoscopy;  Laterality: N/A;  . I & D EXTREMITY Right 10/30/2013   Procedure: IRRIGATION AND DEBRIDEMENT RIGHT SMALL FINGER POSSIBLE SECONDARY WOUND CLOSURE;  Surgeon: [name], MD;  [facility];  Service: Orthopedics;  Laterality: Right;  [name] is available after 4pm  . I & D EXTREMITY Right 11/01/2013   Procedure:  IRRIGATION AND DEBRIDEMENT RIGHT  PROXIMAL PHALANGEAL LEVEL AMPUTATION;  Surgeon: [name], MD;  [facility];  Service: Orthopedics;  Laterality: Right;  . INCISION AND DRAINAGE Right 10/28/2013   Procedure: INCISION AND DRAINAGE Right small finger;  Surgeon: [name], MD;  [facility];  Service: Orthopedics;  Laterality: Right;  . LEFT HEART CATH Left 11/03/2011   Procedure: LEFT HEART CATH;  Surgeon: [name], MD;  Location: [facility] CATH LAB;  Service: Cardiovascular;  Laterality: Left;  . LEFT HEART CATH AND CORONARY ANGIOGRAPHY N/A 04/09/2019   Procedure: LEFT HEART CATH AND CORONARY ANGIOGRAPHY;  Surgeon: Jettie Booze, MD;  Location: [place] CV LAB;  Service: Cardiovascular;  Laterality: N/A;  . LEFT HEART CATHETERIZATION WITH CORONARY ANGIOGRAM N/A 07/03/2014   Procedure: LEFT HEART CATHETERIZATION WITH CORONARY ANGIOGRAM;  Surgeon: [name], MD;  Location: [facility] CATH LAB;  Service: Cardiovascular;  Laterality: N/A;  . PERCUTANEOUS CORONARY STENT INTERVENTION (PCI-S) Left 11/03/2011   Procedure: PERCUTANEOUS CORONARY STENT INTERVENTION (PCI-S);  Surgeon: [name], MD;  Location: [facility] CATH LAB;  Service:  Cardiovascular;  Laterality: Left;  . RIGHT/LEFT HEART CATH AND CORONARY ANGIOGRAPHY N/A 08/22/2019   Procedure: RIGHT/LEFT HEART CATH AND CORONARY ANGIOGRAPHY;  Surgeon: Martinique, Peter M, MD;  Location: [place] CV LAB;  Service: Cardiovascular;  Laterality: N/A;  . TOE AMPUTATION Right ~ 2010   "2nd toe"  . TOE AMPUTATION Right 2012   "3rd toe"    There were no vitals filed for this visit.   Subjective Assessment - 05/19/21 1015    Subjective doing ok, always a little ache, I just ignore it    Currently in Pain? Yes    Pain Score 4     Pain Location Back    Pain Orientation Lower                             [facility] Adult PT Treatment/Exercise - 05/19/21 0001      Ambulation/Gait   Gait Comments Gait in the back around the front island. increase fatigue and low back tightness      Lumbar Exercises: Aerobic   UBE (Upper Arm Bike) L  2 2 min fwd/2 min back    Recumbent Bike L2.5 x 2 min standing    Nustep L 5 60min      Lumbar Exercises: Standing   Shoulder Extension Strengthening;Power Tower;Both;20 reps    Shoulder Extension Limitations 10    Other Standing Lumbar Exercises Rows & Lats 25lb 2x10    Other Standing Lumbar Exercises overhead Ext x10      Lumbar Exercises: Seated   Sit to Stand 10 reps   OHP w/ yello wball x2                   PT Short Term Goals - 05/14/21 4825      PT SHORT TERM GOAL #1   Title Patient will be independent with initial HEP.    Status Achieved             PT Long Term Goals - 05/04/21 1347      PT LONG TERM GOAL #1   Title Patient will be independent with advanced HEP.    Time 10    Period Weeks    Status New      PT LONG TERM GOAL #2   Title Patient will improve TUG to 13 sec for reduced fall risk.    Time 12    Period Weeks    Status New      PT LONG TERM GOAL #3   Title Patient will increase BERG to at least 48/56 to place him at a low risk of falling    Baseline 23/56    Time 12    Period  Weeks    Status New      PT LONG TERM GOAL #4   Title Patient will increase functional LE and lumbar strength to at least 5/5 to allow him to more easily perform transfers and ambulation.    Time 12    Period Weeks    Status New                 Plan - 05/19/21 1052    Clinical Impression Statement Progressed to outdoor gait with increase fatigue and low back tightness. No issues with machine level postural strengthening. Some discomfort with overhead extensions. He did well with sit to stand overhead press. Reports some improved mobility.    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 complexity    Rehab Potential Good    PT Frequency 2x / week    PT Duration 12 weeks    PT Treatment/Interventions ADLs/Self Care Home Management;Cryotherapy;Electrical Stimulation;Iontophoresis 4mg /ml Dexamethasone;Moist Heat;Traction;Ultrasound;Gait training;Stair training;Therapeutic activities;Therapeutic exercise;Balance training;Neuromuscular re-education;Dry needling;Passive range of motion;Spinal Manipulations;Joint Manipulations    PT Next Visit Plan core strength and flexibility           Patient will benefit from skilled therapeutic intervention in order to improve the following deficits and impairments:  Difficulty walking,Decreased endurance,Increased muscle spasms,Obesity,Decreased activity tolerance,Pain,Decreased balance,Impaired flexibility,Decreased strength,Postural dysfunction  Visit Diagnosis: Difficulty in walking, not elsewhere classified  Muscle weakness (generalized)  Localized edema  Repeated falls  Chronic bilateral low back pain without sciatica     Problem List Patient Active Problem List   Diagnosis Date Noted  . Chronic low back pain   .  Hypokalemia 09/25/2020  . Type 2 diabetes mellitus with stage 3 chronic kidney disease ([place]) 09/24/2020  . Unstable angina ([place]) 08/17/2019  . Chest pain 08/16/2019  .  NSTEMI (non-ST elevated myocardial infarction) ([place]) 04/06/2019  . ACS (acute coronary syndrome) ([place]) 04/06/2019  . Sepsis ([place]) 03/15/2018  . DKA (diabetic ketoacidoses) 03/15/2018  . Special screening for malignant neoplasms, colon 08/22/2017  . Acute renal failure superimposed on stage 3 chronic kidney disease ([place]) 05/19/2017  . Essential hypertension   . Status post transmetatarsal amputation of foot, right ([place]) 11/08/2016  . Diabetic polyneuropathy associated with type 2 diabetes mellitus ([place]) 11/08/2016  . Pure hypercholesterolemia   . AKI (acute kidney injury) ([place])   . Acute blood loss anemia   . DM type 2 with diabetic peripheral neuropathy ([place])   . Physical debility 08/03/2016  . Chronic osteomyelitis of right foot ([place])   . Cellulitis and abscess of leg, except foot   . Penetrating wound of right foot   . CKD (chronic kidney disease) stage 3, GFR 30-59 ml/min (HCC)   . Unstable angina pectoris ([place]) 07/18/2016  . Chronic combined systolic and diastolic congestive heart failure ([place])   . Chest pain with moderate risk for cardiac etiology 06/24/2014  . CAD S/P multiple PCIs 06/24/2014  . Sleep apnea- on C-pap 06/24/2014  . Morbid obesity due to excess calories ([place]) 11/10/2013  . Hyperglycemia 10/29/2013  . Hyperlipidemia 01/12/2011  . Benign essential HTN 01/12/2011  . GERD 01/12/2011    Scot Jun, PTA 05/19/2021, 10:56 AM  [place]. [place], [place], [address] Phone: [phone]   Fax:  [phone]  Name: Joshua [name] MRN: [id number] Date of Birth: Aug 22, 1956

## 2021-05-22 ENCOUNTER — Encounter: Payer: Self-pay | Admitting: Rehabilitative and Restorative Service Providers"

## 2021-05-22 ENCOUNTER — Ambulatory Visit: Payer: Medicare Other | Admitting: Rehabilitative and Restorative Service Providers"

## 2021-05-22 ENCOUNTER — Other Ambulatory Visit: Payer: Self-pay

## 2021-05-22 DIAGNOSIS — M545 Low back pain, unspecified: Secondary | ICD-10-CM

## 2021-05-22 DIAGNOSIS — R262 Difficulty in walking, not elsewhere classified: Secondary | ICD-10-CM | POA: Diagnosis not present

## 2021-05-22 DIAGNOSIS — G8929 Other chronic pain: Secondary | ICD-10-CM

## 2021-05-22 DIAGNOSIS — R296 Repeated falls: Secondary | ICD-10-CM

## 2021-05-22 DIAGNOSIS — M6281 Muscle weakness (generalized): Secondary | ICD-10-CM

## 2021-05-22 DIAGNOSIS — R600 Localized edema: Secondary | ICD-10-CM

## 2021-05-22 NOTE — Therapy (Signed)
[name]. [place], [place], [address] Phone: [phone]   Fax:  [phone]  Physical Therapy Treatment  Patient Details  Name: TOLBERT MATHESON MRN: [id number] Date of Birth: 1956-01-29 Referring Provider (PT): Benjiman Core, [place]   Encounter Date: 05/22/2021   PT End of Session - 05/22/21 1019     Visit Number 6    Date for PT Re-Evaluation 07/24/21    PT Start Time 1008    PT Stop Time 1056    PT Time Calculation (min) 48 min    Activity Tolerance Patient tolerated treatment well    Behavior During Therapy [facility] for tasks assessed/performed             Past Medical History:  Diagnosis Date   Anemia    Chronic combined systolic and diastolic CHF (congestive heart failure) ([place])    a. 07/2015 Echo: EF 45-50%, mod LVH, mid apical/antsept AK, Gr 1 DD.   CKD (chronic kidney disease), stage III ([place])    Coronary artery disease    a. 2012 s/p MI/PCI: s/p DES to mid/dist RCA and overlapping DES to LAD;  b. 07/2015 Cath: LAD 10% ISR, D1 40(jailed), LCX 71m, OM2 30, OM3 30, RCA 4m ISR, 10d ISR; c. 07/2016 NSTEMI/PCI: LAD 85ost/p/m (3.5x20 Synergy DES overlapping prior stent), D1 40, LCX 28m, OM2 95 (2.75x16 Synergy DES), OM3 30, RCA 61m, 10d.   Depression    Diabetic gastroparesis ([place])    DKA (diabetic ketoacidoses) 03/14/2018   GERD (gastroesophageal reflux disease)    Gout    Heart murmur    Hyperlipemia    Hypertension    Hypertensive heart disease    Ischemic cardiomyopathy    a. 07/2015 Echo: EF 45-50%, mod LVH, mid-apicalanteroseptal AK, Gr1 DD.   Morbid obesity ([place])    OSA on CPAP    Osteomyelitis ([place])    a. 07/2016 MTP joint of R great toe s/p transmetatarsal amputation.   Polyneuropathy in diabetes(357.2)    Pulmonary sarcoidosis ([place])    "problems w/it years ago; no problems anymore" (07/03/2014)   Rectal bleeding    Sleep apnea    Type II diabetes mellitus ([place])     Past Surgical History:   Procedure Laterality Date   AMPUTATION Right 07/24/2016   Procedure: TRANSMETATARSAL FOOT AMPUTATION;  Surgeon: [name], MD;  Location: [place];  Service: Orthopedics;  Laterality: Right;   BALLOON DILATION N/A 03/21/2018   Procedure: BALLOON DILATION;  Surgeon: [name], MD;  Location: [facility] ENDOSCOPY;  Service: Endoscopy;  Laterality: N/A;   BREAST LUMPECTOMY Right ~ 2000   "benign"   CARDIAC CATHETERIZATION N/A 07/30/2015   Procedure: Left Heart Cath and Coronary Angiography;  Surgeon: Burnell Blanks, MD;  Location: [place] CV LAB;  Service: Cardiovascular;  Laterality: N/A;   CARDIAC CATHETERIZATION N/A 07/19/2016   Procedure: Left Heart Cath and Coronary Angiography;  Surgeon: Belva Crome, MD;  Location: [place] CV LAB;  Service: Cardiovascular;  Laterality: N/A;   CARDIAC CATHETERIZATION N/A 07/29/2016   Procedure: Coronary Stent Intervention;  Surgeon: Belva Crome, MD;  Location: [place] CV LAB;  Service: Cardiovascular;  Laterality: N/A;   CARDIAC CATHETERIZATION N/A 07/29/2016   Procedure: Intravascular Ultrasound/IVUS;  Surgeon: Belva Crome, MD;  Location: [place] CV LAB;  Service: Cardiovascular;  Laterality: N/A;   CATARACT EXTRACTION W/ INTRAOCULAR LENS  IMPLANT, BILATERAL Bilateral 2014-2015   COLONOSCOPY WITH PROPOFOL N/A 08/22/2017   Procedure: COLONOSCOPY  WITH PROPOFOL;  Surgeon: Charlott Rakes, MD;  Location[facility]OSCOPY;  Service: Endoscopy;  Laterality: N/A;   CORONARY ANGIOPLASTY WITH STENT PLACEMENT  10/31/2011   30 % MID ATRIOVENTRICULAR GROOVE CX STENOSIS. 90 % MID RCA.PTA TO LAD/STENTING OF TANDEM 60-70%. LAD STENOSIS 99% REDUCED TO 0%.3.0 X PROMUS DES POSTDILATED TO 3.25MM.   CORONARY ANGIOPLASTY WITH STENT PLACEMENT  07/03/2014   "2"   CORONARY STENT INTERVENTION N/A 04/09/2019   Procedure: CORONARY STENT INTERVENTION;  Surgeon: Corky Crafts, MD;  Location: [facility] INVASIVE CV LAB;  Service: Cardiovascular;  Laterality: N/A;    CORONARY STENT INTERVENTION N/A 08/22/2019   Procedure: CORONARY STENT INTERVENTION;  Surgeon: Swaziland, Peter M, MD;  Location: [facility] INVASIVE CV LAB;  Service: Cardiovascular;  Laterality: N/A;   ESOPHAGOGASTRODUODENOSCOPY (EGD) WITH PROPOFOL N/A 03/21/2018   Procedure: ESOPHAGOGASTRODUODENOSCOPY (EGD) WITH PROPOFOL;  Surgeon: Bernette Redbird, MD;  Location: [facility] ENDOSCOPY;  Service: Endoscopy;  Laterality: N/A;   I & D EXTREMITY Right 10/30/2013   Procedure: IRRIGATION AND DEBRIDEMENT RIGHT SMALL FINGER POSSIBLE SECONDARY WOUND CLOSURE;  Surgeon: Marlowe Shores, MD;  [facility];  Service: Orthopedics;  Laterality: Right;  Mina Marble is available after 4pm   I & D EXTREMITY Right 11/01/2013   Procedure:  IRRIGATION AND DEBRIDEMENT RIGHT  PROXIMAL PHALANGEAL LEVEL AMPUTATION;  Surgeon: Marlowe Shores, MD;  [facility];  Service: Orthopedics;  Laterality: Right;   INCISION AND DRAINAGE Right 10/28/2013   Procedure: INCISION AND DRAINAGE Right small finger;  Surgeon: Marlowe Shores, MD;  [facility];  Service: Orthopedics;  Laterality: Right;   LEFT HEART CATH Left 11/03/2011   Procedure: LEFT HEART CATH;  Surgeon: Lennette Bihari, MD;  Location: [facility] CATH LAB;  Service: Cardiovascular;  Laterality: Left;   LEFT HEART CATH AND CORONARY ANGIOGRAPHY N/A 04/09/2019   Procedure: LEFT HEART CATH AND CORONARY ANGIOGRAPHY;  Surgeon: Corky Crafts, MD;  Location: [facility] INVASIVE CV LAB;  Service: Cardiovascular;  Laterality: N/A;   LEFT HEART CATHETERIZATION WITH CORONARY ANGIOGRAM N/A 07/03/2014   Procedure: LEFT HEART CATHETERIZATION WITH CORONARY ANGIOGRAM;  Surgeon: Lesleigh Noe, MD;  Location: [facility] CATH LAB;  Service: Cardiovascular;  Laterality: N/A;   PERCUTANEOUS CORONARY STENT INTERVENTION (PCI-S) Left 11/03/2011   Procedure: PERCUTANEOUS CORONARY STENT INTERVENTION (PCI-S);  Surgeon: Lennette Bihari, MD;  Location: [facility] CATH LAB;  Service: Cardiovascular;  Laterality: Left;    RIGHT/LEFT HEART CATH AND CORONARY ANGIOGRAPHY N/A 08/22/2019   Procedure: RIGHT/LEFT HEART CATH AND CORONARY ANGIOGRAPHY;  Surgeon: [place]il[place], Peter M, MD;  Location: [facility] INVASIVE CV LAB;  Service: Cardiovascular;  Laterality: N/A;   TOE AMPUTATION Right ~ 2010   "2nd toe"   TOE AMPUTATION Right 2012   "3rd toe"    There were no vitals filed for this visit.   Subjective Assessment - 05/22/21 1016     Subjective I am a little stiff today    Patient Stated Goals I would love to be able to walk and stand and sit up straight for a while without my back bothering me.    Currently in Pain? Yes    Pain Score 4     Pain Location Back    Pain Orientation Lower    Pain Descriptors / Indicators Tightness;Sore                               [facility] Adult PT Treatment/Exercise - 05/22/21 0001  Lumbar Exercises: Stretches   Other Lumbar Stretch Exercise 3 way ball roll out on blue bpall x5 each, 10 sec hold      Lumbar Exercises: Aerobic   UBE (Upper Arm Bike) L2.5 in standing 2 min fwd/87min back    Nustep L5 x76min      Lumbar Exercises: Machines for Strengthening   Other Lumbar Machine Exercise 4 way resisted walking 20# x3 each      Lumbar Exercises: Standing   Shoulder Extension Strengthening;Power Tower;Both;20 reps    Shoulder Extension Limitations 10    Other Standing Lumbar Exercises Rows & Lats 25lb 2x10    Other Standing Lumbar Exercises overhead Ext x10      Lumbar Exercises: Seated   Sit to Stand 10 reps   OHP w/ yello wball x2 sets                     PT Short Term Goals - 05/14/21 5638       PT SHORT TERM GOAL #1   Title Patient will be independent with initial HEP.    Status Achieved               PT Long Term Goals - 05/22/21 1058       PT LONG TERM GOAL #1   Title Patient will be independent with advanced HEP.    Status On-going      PT LONG TERM GOAL #2   Title Patient will improve TUG to 13 sec for reduced fall risk.     Status On-going      PT LONG TERM GOAL #3   Title Patient will increase BERG to at least 48/56 to place him at a low risk of falling    Status On-going      PT LONG TERM GOAL #4   Title Patient will increase functional LE and lumbar strength to at least 5/5 to allow him to more easily perform transfers and ambulation.    Status On-going                   Plan - 05/22/21 1055     Clinical Impression Statement Patient continues to progress towards goal related activities and improved core stability.  He is able to metter maintain core/body mechanics during session with fewer cues.  Pt reports back fatigue with resisted walking with 20#.  Standing UBE pt experiences some back strain/fatigue and require cuing for decreased back rotation with going backwards with UBE.  He continues to require skilled PT.    PT Treatment/Interventions ADLs/Self Care Home Management;Cryotherapy;Electrical Stimulation;Iontophoresis 4mg /ml Dexamethasone;Moist Heat;Traction;Ultrasound;Gait training;Stair training;Therapeutic activities;Therapeutic exercise;Balance training;Neuromuscular re-education;Dry needling;Passive range of motion;Spinal Manipulations;Joint Manipulations    PT Next Visit Plan core strength and flexibilty    Consulted and Agree with Plan of Care Patient             Patient will benefit from skilled therapeutic intervention in order to improve the following deficits and impairments:  Difficulty walking, Decreased endurance, Increased muscle spasms, Obesity, Decreased activity tolerance, Pain, Decreased balance, Impaired flexibility, Decreased strength, Postural dysfunction  Visit Diagnosis: Difficulty in walking, not elsewhere classified  Muscle weakness (generalized)  Repeated falls  Chronic bilateral low back pain without sciatica  Localized edema     Problem List Patient Active Problem List   Diagnosis Date Noted   Chronic low back pain    Hypokalemia 09/25/2020    Type 2 diabetes mellitus with stage 3 chronic kidney disease ([place]) 09/24/2020  Unstable angina ([place]) 08/17/2019   Chest pain 08/16/2019   NSTEMI (non-ST elevated myocardial infarction) ([place]) 04/06/2019   ACS (acute coronary syndrome) ([place]) 04/06/2019   Sepsis ([place]) 03/15/2018   DKA (diabetic ketoacidoses) 03/15/2018   Special screening for malignant neoplasms, colon 08/22/2017   Acute renal failure superimposed on stage 3 chronic kidney disease ([place]) 05/19/2017   Essential hypertension    Status post transmetatarsal amputation of foot, right ([place]) 11/08/2016   Diabetic polyneuropathy associated with type 2 diabetes mellitus ([place]) 11/08/2016   Pure hypercholesterolemia    AKI (acute kidney injury) ([place])    Acute blood loss anemia    DM type 2 with diabetic peripheral neuropathy ([place])    Physical debility 08/03/2016   Chronic osteomyelitis of right foot ([place])    Cellulitis and abscess of leg, except foot    Penetrating wound of right foot    CKD (chronic kidney disease) stage 3, GFR 30-59 ml/min (HCC)    Unstable angina pectoris ([place]) 07/18/2016   Chronic combined systolic and diastolic congestive heart failure ([place])    Chest pain with moderate risk for cardiac etiology 06/24/2014   CAD S/P multiple PCIs 06/24/2014   Sleep apnea- on C-pap 06/24/2014   Morbid obesity due to excess calories ([place]) 11/10/2013   Hyperglycemia 10/29/2013   Hyperlipidemia 01/12/2011   Benign essential HTN 01/12/2011   GERD 01/12/2011    Juel Burrow, PT, DPT 05/22/2021, 10:59 AM  [place]. [place], [place], [address] Phone: [phone]   Fax:  [phone]  Name: ADEMIDE SCHABERG MRN: [id number] Date of Birth: 08/31/1956

## 2021-05-25 ENCOUNTER — Encounter: Payer: Self-pay | Admitting: Dietician

## 2021-05-25 ENCOUNTER — Encounter: Payer: Medicare Other | Attending: Internal Medicine | Admitting: Dietician

## 2021-05-25 ENCOUNTER — Other Ambulatory Visit: Payer: Self-pay

## 2021-05-25 DIAGNOSIS — E1142 Type 2 diabetes mellitus with diabetic polyneuropathy: Secondary | ICD-10-CM

## 2021-05-25 NOTE — Patient Instructions (Addendum)
Exercise as your foundation  Be consistent Be mindful of your sodium intake Aim to increase your vegetable intake.  Try to have 1 vegetable each time you eat. Be mindful about your fluid intake.  Drink enough water.  2 Liter limit per day (8 cups). Consider checking your blood glucose consistently. Continue to take medications as prescribed.   Eat more Non-Starchy Vegetables These include greens, broccoli, cauliflower, cabbage, carrots, beets, eggplant, peppers, squash and others. Minimize added sugars and refined grains Rethink what you drink.  Choose beverages without added sugar.  Look for 0 carbs on the label.  Limit beer. See the list of whole grains below.  Find alternatives to usual sweet treats. Choose whole foods over processed. Make simple meals at home more often than eating out.  Tips to increase fiber in your diet: (All plants have fiber.  Eat a variety. There are more than are on this list.) Slowly increase the amount of fiber you eat to 35-40 grams per day.  (More is fine if you tolerate it.) Fiber from whole grains, nuts and seeds Quinoa, 1/2 cup = 5 grams Bulgur, 1/2 cup = 4.1 grams Popcorn, 3 cups = 3.6 grams Whole Wheat Spaghetti, 1/2 cup = 3.2 grams Barley, 1/2 cup = 3 grams Oatmeal, 1/2 cup = 2 grams Whole Wheat English Muffin = 3 grams Corn, 1/2 cup = 2.1 grams Brown Rice, 1/2 cup = 1.8 grams Flax seeds, 1 Tablespoon = 2.8 grams Chia seeds, 1 Tablespoon = 11 grams Almonds, 1 ounce = 3.5 grams fiber Fiber from legumes Kidney beans, 1/2 cup 7.9 grams Lentils, 1/2 cup = 7.8 grams Pinto beans, 1/2 cup = 7.7 grams Black beans, 1/2 cup = 7.6 grams Lima beans, 1/2 cup 6.4 grams Chick peas, 1/2 cup = 5.3 grams Black eyed peas, 1/2 cup = 4 grams Fiber from fruits and vegetables Pear, 6 grams Apple. 3.3 grams Raspberries or Blackberries, 3/4 cup = 6 grams Strawberries or Blueberries, 1 cup = 3.4 grams Baked sweet potato 3.8 grams fiber Baked potato with skin  4.4 grams  Peas, 1/2 cup = 4.4 grams  Spinach, 1/2 cup cooked = 3.5 grams  Avocado, 1/2 = 5 grams

## 2021-05-25 NOTE — Progress Notes (Addendum)
Medical Nutrition Therapy  Appointment Start time:  1100  Appointment End time:  1096 Patient is here today alone.  He was last seen by this RD 03/02/2021.  Primary concerns today: follow up  Referral diagnosis: Type 2 Diabetes Preferred learning style: no preference indicated Learning readiness: contemplating    NUTRITION ASSESSMENT  He has been going to physical therapy and states that walking and balance are better.   He continues to struggle with consistency of meals.  States that he is eating more regularly and larger portions. States that he has a "bingy" personality but denies this related to alcohol.  He states that this is related to work and food at times. He is not eating out except for a few bites of what his sons bring in. Uses a small amount of sea salt.  Some high sodium processed foods. Few vegetables (cutting vegetables is difficult and hates all prep work).. Craving fruit (particularly watermelon) and tea. He states that his kidney function is now back to normal and should limit to 2 Liters daily per his nephrologist.  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 eGFR 19 09/28/2020 Notable Signs/Symptoms: fluid  retention (swelling leg, foot)  Lifestyle & Dietary Hx Patient lives with his wife and three sons.  His son is paying the mortgage and helps out a lot with finances.  His wife has mobility issus and recently lost her job.  Shopping causes him increased physical pain.  He finds it difficult to meet his own nutritional needs as well as the preferences of his family and overall is not cooking.  States that his son's now frequently get take out. 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 Middle son has type 2 diabetes and recently diagnosed.  Estimated daily fluid intake: "not drinking enough water"  "beer intake increased"   Supplements:  Sleep: poor quality "not deep:, only about 5 hours at night.  C-pap is working better. Stress / self-care: financial, disability of wife, difficulty with stress Current average weekly physical activity: current PT   24-Hr Dietary Recall First Meal: usually skips Snack: none Second Meal: usually skips Snack: none Third Meal: 2 slices thin crust frozen pizza, 4 shrimp (preprepared), french bread with premade garlic butter, salad with double ranch Snack: occasinal Beverages: water, beer, tea (unsweetened or sweet)   NUTRITION DIAGNOSIS  NB-1.1 Food and nutrition-related knowledge deficit As related to balance of carbohydrate, protein, and fat.  As evidenced by diet hx and patient report.   NUTRITION INTERVENTION  Nutrition education (E-1) on the following topics:  Review of sodium/hidden sodium and recommendations to read labels and limit to 600 mg per meal Fluid guidelines and need for adequate water as well as fluid choice Importance of consistency of exercise Fiber, sources, goals  Handouts Provided Include  Low sodium nutrition therapy from [facility] Teaching method utilized: Visual & Auditory  Demonstrated degree of understanding via: Teach Back  Barriers to learning/adherence to lifestyle change:  finances, difficulty cooking  Goals Established by Pt PT Adequate fluid Sodium control Consistent blood glucose monitoring   MONITORING & EVALUATION Dietary intake, weekly physical activity, and label reading in 2-3 months.  Next Steps  Patient is to call as needed.

## 2021-05-26 ENCOUNTER — Ambulatory Visit: Payer: Medicare Other | Admitting: Rehabilitative and Restorative Service Providers"

## 2021-05-26 ENCOUNTER — Encounter: Payer: Self-pay | Admitting: Rehabilitative and Restorative Service Providers"

## 2021-05-26 DIAGNOSIS — G8929 Other chronic pain: Secondary | ICD-10-CM

## 2021-05-26 DIAGNOSIS — R296 Repeated falls: Secondary | ICD-10-CM

## 2021-05-26 DIAGNOSIS — R262 Difficulty in walking, not elsewhere classified: Secondary | ICD-10-CM | POA: Diagnosis not present

## 2021-05-26 DIAGNOSIS — M6281 Muscle weakness (generalized): Secondary | ICD-10-CM

## 2021-05-26 NOTE — Therapy (Signed)
[place]. [place], [place], [address] Phone: [phone]   Fax:  [phone]  Physical Therapy Treatment  Patient Details  Name: Joshua [name] MRN: [id number] Date of Birth: [birth year] Referring Provider (PT): Benjiman Core, [place]   Encounter Date: 05/26/2021   PT End of Session - 05/26/21 1034     Visit Number 7    Date for PT Re-Evaluation 07/24/21    PT Start Time 2202    PT Stop Time 1100    PT Time Calculation (min) 45 min    Activity Tolerance Patient tolerated treatment well    Behavior During Therapy [facility] for tasks assessed/performed             Past Medical History:  Diagnosis Date   Anemia    Chronic combined systolic and diastolic CHF (congestive heart failure) ([place])    a. 07/2015 Echo: EF 45-50%, mod LVH, mid apical/antsept AK, Gr 1 DD.   CKD (chronic kidney disease), stage III ([place])    Coronary artery disease    a. 2012 s/p MI/PCI: s/p DES to mid/dist RCA and overlapping DES to LAD;  b. 07/2015 Cath: LAD 10% ISR, D1 40(jailed), LCX 47m, OM2 30, OM3 30, RCA 63m ISR, 10d ISR; c. 07/2016 NSTEMI/PCI: LAD 85ost/p/m (3.5x20 Synergy DES overlapping prior stent), D1 40, LCX 73m, OM2 95 (2.75x16 Synergy DES), OM3 30, RCA 57m, 10d.   Depression    Diabetic gastroparesis ([place])    DKA (diabetic ketoacidoses) 03/14/2018   GERD (gastroesophageal reflux disease)    Gout    Heart murmur    Hyperlipemia    Hypertension    Hypertensive heart disease    Ischemic cardiomyopathy    a. 07/2015 Echo: EF 45-50%, mod LVH, mid-apicalanteroseptal AK, Gr1 DD.   Morbid obesity ([place])    OSA on CPAP    Osteomyelitis ([place])    a. 07/2016 MTP joint of R great toe s/p transmetatarsal amputation.   Polyneuropathy in diabetes(357.2)    Pulmonary sarcoidosis ([place])    "problems w/it years ago; no problems anymore" (07/03/2014)   Rectal bleeding    Sleep apnea    Type II diabetes mellitus ([place])     Past Surgical History:   Procedure Laterality Date   AMPUTATION Right 07/24/2016   Procedure: TRANSMETATARSAL FOOT AMPUTATION;  Surgeon: [name], MD;  Location: [facility] [place];  Service: Orthopedics;  Laterality: Right;   BALLOON DILATION N/A 03/21/2018   Procedure: BALLOON DILATION;  Surgeon: [name], MD;  Location[facility] ENDOSCOPY;  Service: Endoscopy;  Laterality: N/A;   BREAST LUMPECTOMY Right ~ 2000   "benign"   CARDIAC CATHETERIZATION N/A 07/30/2015   Procedure: Left Heart Cath and Coronary Angiography;  Surgeon: Burnell Blanks, MD;  Location: [place] CV LAB;  Service: Cardiovascular;  Laterality: N/A;   CARDIAC CATHETERIZATION N/A 07/19/2016   Procedure: Left Heart Cath and Coronary Angiography;  Surgeon: Belva Crome, MD;  Location: [place] [facility] LAB;  Service: Cardiovascular;  Laterality: N/A;   CARDIAC CATHETERIZATION N/A 07/29/2016   Procedure: Coronary Stent Intervention;  Surgeon: Belva Crome, MD;  Location: [place] [facility];  Service: Cardiovascular;  Laterality: N/A;   CARDIAC CATHETERIZATION N/A 07/29/2016   Procedure: Intravascular Ultrasound/IVUS;  Surgeon: Belva Crome, MD;  Location: [place] [facility];  Service: Cardiovascular;  Laterality: N/A;   CATARACT EXTRACTION W/ INTRAOCULAR LENS  IMPLANT, BILATERAL Bilateral 2014-2015   COLONOSCOPY WITH PROPOFOL N/A 08/22/2017   Procedure: COLONOSCOPY  WITH PROPOFOL;  Surgeon: [name], MD;  Location: [place];  Service: Endoscopy;  Laterality: N/A;   CORONARY ANGIOPLASTY WITH STENT PLACEMENT  10/31/2011   30 % MID ATRIOVENTRICULAR GROOVE CX STENOSIS. 90 % MID RCA.PTA TO LAD/STENTING OF TANDEM 60-70%. LAD STENOSIS 99% REDUCED TO 0%.3.0 X 32MM PROMUS DES POSTDILATED TO 3.25MM.   CORONARY ANGIOPLASTY WITH STENT PLACEMENT  07/03/2014   "2"   CORONARY STENT INTERVENTION N/A 04/09/2019   Procedure: CORONARY STENT INTERVENTION;  Surgeon: Jettie Booze, MD;  Location: [place] [facility];  Service: Cardiovascular;  Laterality: N/A;    CORONARY STENT INTERVENTION N/A 08/22/2019   Procedure: CORONARY STENT INTERVENTION;  Surgeon: Martinique, Peter M, MD;  Location: [place] [facility];  Service: Cardiovascular;  Laterality: N/A;   ESOPHAGOGASTRODUODENOSCOPY (EGD) WITH PROPOFOL N/A 03/21/2018   Procedure: ESOPHAGOGASTRODUODENOSCOPY (EGD) WITH PROPOFOL;  Surgeon: [name], MD;  Location: [place];  Service: Endoscopy;  Laterality: N/A;   I & D EXTREMITY Right 10/30/2013   Procedure: IRRIGATION AND DEBRIDEMENT RIGHT SMALL FINGER POSSIBLE SECONDARY WOUND CLOSURE;  Surgeon: [name], MD;  Location: [facility];  Service: Orthopedics;  Laterality: Right;  [name] is available after 4pm   I & D EXTREMITY Right 11/01/2013   Procedure:  IRRIGATION AND DEBRIDEMENT RIGHT  PROXIMAL PHALANGEAL LEVEL AMPUTATION;  Surgeon: [name], MD;  Location: [facility];  Service: Orthopedics;  Laterality: Right;   INCISION AND DRAINAGE Right 10/28/2013   Procedure: INCISION AND DRAINAGE Right small finger;  Surgeon: [name], MD;  Location: [facility];  Service: Orthopedics;  Laterality: Right;   LEFT HEART CATH Left 11/03/2011   Procedure: LEFT HEART CATH;  Surgeon: [name], MD;  Location: [facility] CATH LAB;  Service: Cardiovascular;  Laterality: Left;   LEFT HEART CATH AND CORONARY ANGIOGRAPHY N/A 04/09/2019   Procedure: LEFT HEART CATH AND CORONARY ANGIOGRAPHY;  Surgeon: Jettie Booze, MD;  Location: [place] [facility];  Service: Cardiovascular;  Laterality: N/A;   LEFT HEART CATHETERIZATION WITH CORONARY ANGIOGRAM N/A 07/03/2014   Procedure: LEFT HEART CATHETERIZATION WITH CORONARY ANGIOGRAM;  Surgeon: [name], MD;  Location[facility] CATH LAB;  Service: Cardiovascular;  Laterality: N/A;   PERCUTANEOUS CORONARY STENT INTERVENTION (PCI-S) Left 11/03/2011   Procedure: PERCUTANEOUS CORONARY STENT INTERVENTION (PCI-S);  Surgeon: [name], MD;  Location: [facility] CATH LAB;  Service: Cardiovascular;  Laterality: Left;    RIGHT/LEFT HEART CATH AND CORONARY ANGIOGRAPHY N/A 08/22/2019   Procedure: RIGHT/LEFT HEART CATH AND CORONARY ANGIOGRAPHY;  Surgeon: Martinique, Peter M, MD;  Location: [place] [facility];  Service: Cardiovascular;  Laterality: N/A;   TOE AMPUTATION Right ~ 2010   "2nd toe"   TOE AMPUTATION Right 2012   "3rd toe"    There were no vitals filed for this visit.   Subjective Assessment - 05/26/21 1033     Subjective I am doing okay    Patient Stated Goals I would love to be able to walk and stand and sit up straight for a while without my back bothering me.    Currently in Pain? Yes    Pain Score 3     Pain Location Back    Pain Orientation Lower    Pain Descriptors / Indicators Sore                               [facility] Adult PT Treatment/Exercise - 05/26/21 0001  Transfers   Five time sit to stand comments  21.5 sec      Standardized Balance Assessment   Standardized Balance Assessment Timed Up and Go Test      Timed Up and Go Test   TUG Normal TUG    Normal TUG (seconds) 13.8      Lumbar Exercises: Aerobic   UBE (Upper Arm Bike) L2.5 in standing 2 min fwd/51mn back    Nustep L5 x647m      Lumbar Exercises: Machines for Strengthening   Other Lumbar Machine Exercise 4 way resisted walking 30# x3 each      Lumbar Exercises: Standing   Shoulder Extension Strengthening;Power Tower;Both;20 reps    Shoulder Extension Limitations 15#    Other Standing Lumbar Exercises Rows & Lats 25lb 2x10    Other Standing Lumbar Exercises Overhead trunk extension, Trunk Rotation with yellow wt ball x10 each way      Lumbar Exercises: Seated   Sit to Stand 20 reps    Sit to Stand Limitations x10 with CP with yellow wt ball, x10 with OHP with yellow wt ball                      PT Short Term Goals - 05/14/21 [id number]     PT SHORT TERM GOAL #1   Title Patient will be independent with initial HEP.    Status Achieved               PT Long Term Goals -  05/26/21 1151       PT LONG TERM GOAL #1   Title Patient will be independent with advanced HEP.    Status On-going      PT LONG TERM GOAL #2   Title Patient will improve TUG to 13 sec for reduced fall risk.    Status Partially Met      PT LONG TERM GOAL #3   Title Patient will increase BERG to at least 48/56 to place him at a low risk of falling    Status On-going      PT LONG TERM GOAL #4   Title Patient will increase functional LE and lumbar strength to at least 5/5 to allow him to more easily perform transfers and ambulation.    Status On-going                   Plan - 05/26/21 1100     Clinical Impression Statement Pt continues to progress towards goal related activities.  He is progressing with improved core stability and improved strength.  He had most difficulty with overhead trunk extension with yellow weighted ball.  He requires cuing for posture and to maintain core stability during session.  He continues to require skilled PT to progress towards goal related activities.    PT Treatment/Interventions ADLs/Self Care Home Management;Cryotherapy;Electrical Stimulation;Iontophoresis 46m56ml Dexamethasone;Moist Heat;Traction;Ultrasound;Gait training;Stair training;Therapeutic activities;Therapeutic exercise;Balance training;Neuromuscular re-education;Dry needling;Passive range of motion;Spinal Manipulations;Joint Manipulations    PT Next Visit Plan core strength and flexibilty    Consulted and Agree with Plan of Care Patient             Patient will benefit from skilled therapeutic intervention in order to improve the following deficits and impairments:  Difficulty walking, Decreased endurance, Increased muscle spasms, Obesity, Decreased activity tolerance, Pain, Decreased balance, Impaired flexibility, Decreased strength, Postural dysfunction  Visit Diagnosis: Difficulty in walking, not elsewhere classified  Muscle weakness (generalized)  Repeated  falls  Chronic  bilateral low back pain without sciatica     Problem List Patient Active Problem List   Diagnosis Date Noted   Chronic low back pain    Hypokalemia 09/25/2020   Type 2 diabetes mellitus with stage 3 chronic kidney disease ([place]) 09/24/2020   Unstable angina ([place]) 08/17/2019   Chest pain 08/16/2019   NSTEMI (non-ST elevated myocardial infarction) ([place]) 04/06/2019   ACS (acute coronary syndrome) ([place]) 04/06/2019   Sepsis ([place]) 03/15/2018   DKA (diabetic ketoacidoses) 03/15/2018   Special screening for malignant neoplasms, colon 08/22/2017   Acute renal failure superimposed on stage 3 chronic kidney disease ([place]) 05/19/2017   Essential hypertension    Status post transmetatarsal amputation of foot, right ([place]) 11/08/2016   Diabetic polyneuropathy associated with type 2 diabetes mellitus ([place]) 11/08/2016   Pure hypercholesterolemia    AKI (acute kidney injury) ([place])    Acute blood loss anemia    DM type 2 with diabetic peripheral neuropathy ([place])    Physical debility 08/03/2016   Chronic osteomyelitis of right foot ([place])    Cellulitis and abscess of leg, except foot    Penetrating wound of right foot    CKD (chronic kidney disease) stage 3, GFR 30-59 ml/min ([facility])    Unstable angina pectoris ([place]) 07/18/2016   Chronic combined systolic and diastolic congestive heart failure ([place])    Chest pain with moderate risk for cardiac etiology 06/24/2014   CAD S/P multiple PCIs 06/24/2014   Sleep apnea- on C-pap 06/24/2014   Morbid obesity due to excess calories ([place]) 11/10/2013   Hyperglycemia 10/29/2013   Hyperlipidemia 01/12/2011   Benign essential HTN 01/12/2011   GERD 01/12/2011    Juel Burrow, PT, DPT 05/26/2021, 11:54 AM  [place]. [place], [place], [address] Phone: [phone]   Fax:  [phone]  Name: Joshua [name] MRN: [id number] Date of Birth: 09-Apr-1956

## 2021-05-28 ENCOUNTER — Encounter: Payer: Self-pay | Admitting: Physical Therapy

## 2021-05-28 ENCOUNTER — Ambulatory Visit: Payer: Medicare Other | Admitting: Physical Therapy

## 2021-05-28 ENCOUNTER — Other Ambulatory Visit: Payer: Self-pay

## 2021-05-28 DIAGNOSIS — M545 Low back pain, unspecified: Secondary | ICD-10-CM

## 2021-05-28 DIAGNOSIS — M6281 Muscle weakness (generalized): Secondary | ICD-10-CM

## 2021-05-28 DIAGNOSIS — R600 Localized edema: Secondary | ICD-10-CM

## 2021-05-28 DIAGNOSIS — R262 Difficulty in walking, not elsewhere classified: Secondary | ICD-10-CM | POA: Diagnosis not present

## 2021-05-28 DIAGNOSIS — R296 Repeated falls: Secondary | ICD-10-CM

## 2021-05-28 DIAGNOSIS — G8929 Other chronic pain: Secondary | ICD-10-CM

## 2021-05-28 NOTE — Therapy (Signed)
[address] [place], [place], [address] Phone: [phone]   Fax:  [phone]  Physical Therapy Treatment  Patient Details  Name: Joshua [name] MRN: [id number] Date of Birth: 1960-04-22 Referring Provider (PT): Benjiman Core, [place]   Encounter Date: 05/28/2021   PT End of Session - 05/28/21 1053     Visit Number 8    Date for PT Re-Evaluation 07/24/21    PT Start Time 1443    PT Stop Time 1055    PT Time Calculation (min) 40 min    Activity Tolerance Patient tolerated treatment well    Behavior During Therapy [facility] for tasks assessed/performed             Past Medical History:  Diagnosis Date   Anemia    Chronic combined systolic and diastolic CHF (congestive heart failure) ([place])    a. 07/2015 Echo: EF 45-50%, mod LVH, mid apical/antsept AK, Gr 1 DD.   CKD (chronic kidney disease), stage III ([place])    Coronary artery disease    a. 2012 s/p MI/PCI: s/p DES to mid/dist RCA and overlapping DES to LAD;  b. 07/2015 Cath: LAD 10% ISR, D1 40(jailed), LCX 44m OM2 30, OM3 30, RCA 556mSR, 10d ISR; c. 07/2016 NSTEMI/PCI: LAD 85ost/p/m (3.5x20 Synergy DES overlapping prior stent), D1 40, LCX 7555mM2 95 (2.75x16 Synergy DES), OM3 30, RCA 41m51md.   Depression    Diabetic gastroparesis (HCC)[place] DKA (diabetic ketoacidoses) 03/14/2018   GERD (gastroesophageal reflux disease)    Gout    Heart murmur    Hyperlipemia    Hypertension    Hypertensive heart disease    Ischemic cardiomyopathy    a. 07/2015 Echo: EF 45-50%, mod LVH, mid-apicalanteroseptal AK, Gr1 DD.   Morbid obesity (HCC)[place] OSA on CPAP    Osteomyelitis (HCC)[place] a. 07/2016 MTP joint of R great toe s/p transmetatarsal amputation.   Polyneuropathy in diabetes(357.2)    Pulmonary sarcoidosis (HCC)[place] "problems w/it years ago; no problems anymore" (07/03/2014)   Rectal bleeding    Sleep apnea    Type II diabetes mellitus (HCC)[place]  Past Surgical History:   Procedure Laterality Date   AMPUTATION Right 07/24/2016   Procedure: TRANSMETATARSAL FOOT AMPUTATION;  Surgeon: MarcNewt Minion;  [facility];  Laterality: Right;   BALLOON DILATION N/A 03/21/2018   Procedure: BALLOON DILATION;  Surgeon: BuccRonald Lobo;  [facility];  Service: Endoscopy;  Laterality: N/A;   BREAST LUMPECTOMY Right ~ 2000   "benign"   CARDIAC CATHETERIZATION N/A 07/30/2015   Procedure: Left Heart Cath and Coronary Angiography;  Surgeon: ChriBurnell Blanks;  [facility] [place];  Service: Cardiovascular;  Laterality: N/A;   CARDIAC CATHETERIZATION N/A 07/19/2016   Procedure: Left Heart Cath and Coronary Angiography;  Surgeon: HenrBelva Crome;  [facility];  Service: Cardiovascular;  Laterality: N/A;   CARDIAC CATHETERIZATION N/A 07/29/2016   Procedure: Coronary Stent Intervention;  Surgeon: HenrBelva Crome;  Location: [facility];  Service: Cardiovascular;  Laterality: N/A;   CARDIAC CATHETERIZATION N/A 07/29/2016   Procedure: Intravascular Ultrasound/IVUS;  Surgeon: HenrBelva Crome;  [facility];  Service: Cardiovascular;  Laterality: N/A;   CATARACT EXTRACTION W/ INTRAOCULAR LENS  IMPLANT, BILATERAL Bilateral 2014-2015   COLONOSCOPY WITH PROPOFOL N/A 08/22/2017   Procedure: COLONOSCOPY  WITH PROPOFOL;  Surgeon: [name], MD;  Location: [place];  Service: Endoscopy;  Laterality: N/A;   CORONARY ANGIOPLASTY WITH STENT PLACEMENT  10/31/2011   30 % MID ATRIOVENTRICULAR GROOVE CX STENOSIS. 90 % MID RCA.PTA TO LAD/STENTING OF TANDEM 60-70%. LAD STENOSIS 99% REDUCED TO 0%.3.0 X 32MM PROMUS DES POSTDILATED TO 3.25MM.   CORONARY ANGIOPLASTY WITH STENT PLACEMENT  07/03/2014   "2"   CORONARY STENT INTERVENTION N/A 04/09/2019   Procedure: CORONARY STENT INTERVENTION;  Surgeon: Jettie Booze, MD;  Location: [place] CV LAB;  Service: Cardiovascular;  Laterality: N/A;    CORONARY STENT INTERVENTION N/A 08/22/2019   Procedure: CORONARY STENT INTERVENTION;  Surgeon: Martinique, Peter M, MD;  Location: [place] CV LAB;  Service: Cardiovascular;  Laterality: N/A;   ESOPHAGOGASTRODUODENOSCOPY (EGD) WITH PROPOFOL N/A 03/21/2018   Procedure: ESOPHAGOGASTRODUODENOSCOPY (EGD) WITH PROPOFOL;  Surgeon:  Lobo, MD;  Location: [place];  Service: Endoscopy;  Laterality: N/A;   I & D EXTREMITY Right 10/30/2013   Procedure: IRRIGATION AND DEBRIDEMENT RIGHT SMALL FINGER POSSIBLE SECONDARY WOUND CLOSURE;  Surgeon: [name], MD;  Location: [facility];  Service: Orthopedics;  Laterality: Right;  [name] is available after 4pm   I & D EXTREMITY Right 11/01/2013   Procedure:  IRRIGATION AND DEBRIDEMENT RIGHT  PROXIMAL PHALANGEAL LEVEL AMPUTATION;  Surgeon: [name], MD;  [facility];  Service: Orthopedics;  Laterality: Right;   INCISION AND DRAINAGE Right 10/28/2013   Procedure: INCISION AND DRAINAGE Right small finger;  Surgeon: [name], MD;  [facility];  Service: Orthopedics;  Laterality: Right;   LEFT HEART CATH Left 11/03/2011   Procedure: LEFT HEART CATH;  Surgeon: [name], MD;  Location: [facility] CATH LAB;  Service: Cardiovascular;  Laterality: Left;   LEFT HEART CATH AND CORONARY ANGIOGRAPHY N/A 04/09/2019   Procedure: LEFT HEART CATH AND CORONARY ANGIOGRAPHY;  Surgeon: Jettie Booze, MD;  Location: [place] CV LAB;  Service: Cardiovascular;  Laterality: N/A;   LEFT HEART CATHETERIZATION WITH CORONARY ANGIOGRAM N/A 07/03/2014   Procedure: LEFT HEART CATHETERIZATION WITH CORONARY ANGIOGRAM;  Surgeon: [name], MD;  Location: [facility] CATH LAB;  Service: Cardiovascular;  Laterality: N/A;   PERCUTANEOUS CORONARY STENT INTERVENTION (PCI-S) Left 11/03/2011   Procedure: PERCUTANEOUS CORONARY STENT INTERVENTION (PCI-S);  Surgeon: [name], MD;  Location: [facility] CATH LAB;  Service: Cardiovascular;  Laterality: Left;    RIGHT/LEFT HEART CATH AND CORONARY ANGIOGRAPHY N/A 08/22/2019   Procedure: RIGHT/LEFT HEART CATH AND CORONARY ANGIOGRAPHY;  Surgeon: Martinique, Peter M, MD;  Location: [place] CV LAB;  Service: Cardiovascular;  Laterality: N/A;   TOE AMPUTATION Right ~ 2010   "2nd toe"   TOE AMPUTATION Right 2012   "3rd toe"    There were no vitals filed for this visit.   Subjective Assessment - 05/28/21 1021     Subjective "Ok for a 65 year old" usual tightness    Currently in Pain? Yes    Pain Score 4     Pain Location Back                               [facility] Adult PT Treatment/Exercise - 05/28/21 0001       Ambulation/Gait   Gait Comments Gait down L side up back parking lot down slope and back up ~385f one standing rest break due to back tightness and fatigue. Some noticeable L foot drop with  uphill ambulation      Lumbar Exercises: Aerobic   UBE (Upper Arm Bike) L2 3 min each way      Lumbar Exercises: Machines for Strengthening   Cybex Knee Extension 10lb 2x10    Cybex Knee Flexion 35lb 2x10      Lumbar Exercises: Standing   Other Standing Lumbar Exercises Rows & Lats 35lb 2x10    Other Standing Lumbar Exercises Overhead trunk extension, Trunk Rotation with yellow wt ball x10 each way                      PT Short Term Goals - 05/14/21 2671       PT SHORT TERM GOAL #1   Title Patient will be independent with initial HEP.    Status Achieved               PT Long Term Goals - 05/26/21 1151       PT LONG TERM GOAL #1   Title Patient will be independent with advanced HEP.    Status On-going      PT LONG TERM GOAL #2   Title Patient will improve TUG to 13 sec for reduced fall risk.    Status Partially Met      PT LONG TERM GOAL #3   Title Patient will increase BERG to at least 48/56 to place him at a low risk of falling    Status On-going      PT LONG TERM GOAL #4   Title Patient will increase functional LE and lumbar strength to  at least 5/5 to allow him to more easily perform transfers and ambulation.    Status On-going                   Plan - 05/28/21 1054     Clinical Impression Statement All interventions completed well. increase fatigue with outdoor ambulation. Some notable L foot drop and fatigue with uphill ambulation requiring one standing rest. Some discomfort noted with overhead extensions and trunk rotations. Added additional machine level intervention for LE strengthening. Increase resistance tolerated with seated rows and lats.    Personal Factors and Comorbidities Age;Fitness    Examination-Activity Limitations Locomotion Level;Bend;Squat;Stairs;Stand    Statistician;Shop;Yard Work    Rehab Potential Good    PT Frequency 2x / week    PT Duration 12 weeks    PT Treatment/Interventions ADLs/Self Care Home Management;Cryotherapy;Electrical Stimulation;Iontophoresis 72m/ml Dexamethasone;Moist Heat;Traction;Ultrasound;Gait training;Stair training;Therapeutic activities;Therapeutic exercise;Balance training;Neuromuscular re-education;Dry needling;Passive range of motion;Spinal Manipulations;Joint Manipulations    PT Next Visit Plan core strength and flexibilty             Patient will benefit from skilled therapeutic intervention in order to improve the following deficits and impairments:  Difficulty walking, Decreased endurance, Increased muscle spasms, Obesity, Decreased activity tolerance, Pain, Decreased balance, Impaired flexibility, Decreased strength, Postural dysfunction  Visit Diagnosis: Difficulty in walking, not elsewhere classified  Repeated falls  Muscle weakness (generalized)  Chronic bilateral low back pain without sciatica  Localized edema     Problem List Patient Active Problem List   Diagnosis Date Noted   Chronic low back pain    Hypokalemia 09/25/2020   Type 2 diabetes mellitus with stage 3 chronic kidney disease  ([place] 09/24/2020   Unstable angina ([place] 08/17/2019   Chest pain 08/16/2019   NSTEMI (non-ST elevated myocardial infarction) ([place] 04/06/2019   ACS (acute coronary syndrome) ([place] 04/06/2019   Sepsis ([place] 03/15/2018  DKA (diabetic ketoacidoses) 03/15/2018   Special screening for malignant neoplasms, colon 08/22/2017   Acute renal failure superimposed on stage 3 chronic kidney disease ([place]) 05/19/2017   Essential hypertension    Status post transmetatarsal amputation of foot, right ([place]) 11/08/2016   Diabetic polyneuropathy associated with type 2 diabetes mellitus ([place]) 11/08/2016   Pure hypercholesterolemia    AKI (acute kidney injury) ([place])    Acute blood loss anemia    DM type 2 with diabetic peripheral neuropathy ([place])    Physical debility 08/03/2016   Chronic osteomyelitis of right foot ([place])    Cellulitis and abscess of leg, except foot    Penetrating wound of right foot    CKD (chronic kidney disease) stage 3, GFR 30-59 ml/min (HCC)    Unstable angina pectoris ([place]) 07/18/2016   Chronic combined systolic and diastolic congestive heart failure ([place])    Chest pain with moderate risk for cardiac etiology 06/24/2014   CAD S/P multiple PCIs 06/24/2014   Sleep apnea- on C-pap 06/24/2014   Morbid obesity due to excess calories ([place]) 11/10/2013   Hyperglycemia 10/29/2013   Hyperlipidemia 01/12/2011   Benign essential HTN 01/12/2011   GERD 01/12/2011    Scot Jun 05/28/2021, 10:58 AM  [place]. [place], [place], [address] Phone: [phone]   Fax:  [phone]  Name: MUBASHIR MALLEK MRN: [id number] Date of Birth: 1956/05/10

## 2021-06-02 ENCOUNTER — Other Ambulatory Visit: Payer: Self-pay

## 2021-06-02 ENCOUNTER — Ambulatory Visit: Payer: Medicare Other | Admitting: Physical Therapy

## 2021-06-02 ENCOUNTER — Encounter: Payer: Self-pay | Admitting: Physical Therapy

## 2021-06-02 DIAGNOSIS — M6281 Muscle weakness (generalized): Secondary | ICD-10-CM

## 2021-06-02 DIAGNOSIS — R296 Repeated falls: Secondary | ICD-10-CM

## 2021-06-02 DIAGNOSIS — R600 Localized edema: Secondary | ICD-10-CM

## 2021-06-02 DIAGNOSIS — R262 Difficulty in walking, not elsewhere classified: Secondary | ICD-10-CM

## 2021-06-02 DIAGNOSIS — G8929 Other chronic pain: Secondary | ICD-10-CM

## 2021-06-02 NOTE — Therapy (Signed)
[place]. [place], [place], [address] Phone: [phone]   Fax:  [phone]  Physical Therapy Treatment  Patient Details  Name: Joshua [name] MRN: [id number] Date of Birth: 1956-11-27 Referring Provider (PT): Benjiman Core, [place]   Encounter Date: 06/02/2021   PT End of Session - 06/02/21 1053     Visit Number 9    Date for PT Re-Evaluation 07/24/21    PT Start Time 6701    PT Stop Time 1055    PT Time Calculation (min) 40 min    Activity Tolerance Patient tolerated treatment well    Behavior During Therapy [facility] for tasks assessed/performed             Past Medical History:  Diagnosis Date   Anemia    Chronic combined systolic and diastolic CHF (congestive heart failure) ([place])    a. 07/2015 Echo: EF 45-50%, mod LVH, mid apical/antsept AK, Gr 1 DD.   CKD (chronic kidney disease), stage III ([place])    Coronary artery disease    a. 2012 s/p MI/PCI: s/p DES to mid/dist RCA and overlapping DES to LAD;  b. 07/2015 Cath: LAD 10% ISR, D1 40(jailed), LCX 85m OM2 30, OM3 30, RCA 556mSR, 10d ISR; c. 07/2016 NSTEMI/PCI: LAD 85ost/p/m (3.5x20 Synergy DES overlapping prior stent), D1 40, LCX 7563mM2 95 (2.75x16 Synergy DES), OM3 30, RCA 77m58md.   Depression    Diabetic gastroparesis (HCC)[place] DKA (diabetic ketoacidoses) 03/14/2018   GERD (gastroesophageal reflux disease)    Gout    Heart murmur    Hyperlipemia    Hypertension    Hypertensive heart disease    Ischemic cardiomyopathy    a. 07/2015 Echo: EF 45-50%, mod LVH, mid-apicalanteroseptal AK, Gr1 DD.   Morbid obesity (HCC)[place] OSA on CPAP    Osteomyelitis (HCC)[place] a. 07/2016 MTP joint of R great toe s/p transmetatarsal amputation.   Polyneuropathy in diabetes(357.2)    Pulmonary sarcoidosis (HCC)[place] "problems w/it years ago; no problems anymore" (07/03/2014)   Rectal bleeding    Sleep apnea    Type II diabetes mellitus (HCC)[place]  Past Surgical History:   Procedure Laterality Date   AMPUTATION Right 07/24/2016   Procedure: TRANSMETATARSAL FOOT AMPUTATION;  Surgeon: MarcNewt Minion;  [facility]opedics;  Laterality: Right;   BALLOON DILATION N/A 03/21/2018   Procedure: BALLOON DILATION;  Surgeon: BuccRonald Lobo;  [facility];  Service: Endoscopy;  Laterality: N/A;   BREAST LUMPECTOMY Right ~ 2000   "benign"   CARDIAC CATHETERIZATION N/A 07/30/2015   Procedure: Left Heart Cath and Coronary Angiography;  Surgeon: ChriBurnell Blanks;  [facility];  Service: Cardiovascular;  Laterality: N/A;   CARDIAC CATHETERIZATION N/A 07/19/2016   Procedure: Left Heart Cath and Coronary Angiography;  Surgeon: HenrBelva Crome;  [facility];  Service: Cardiovascular;  Laterality: N/A;   CARDIAC CATHETERIZATION N/A 07/29/2016   Procedure: Coronary Stent Intervention;  Surgeon: HenrBelva Crome;  [facility];  Service: Cardiovascular;  Laterality: N/A;   CARDIAC CATHETERIZATION N/A 07/29/2016   Procedure: Intravascular Ultrasound/IVUS;  Surgeon: HenrBelva Crome;  [facility];  Service: Cardiovascular;  Laterality: N/A;   CATARACT EXTRACTION W/ INTRAOCULAR LENS  IMPLANT, BILATERAL Bilateral 2014-2015   COLONOSCOPY WITH PROPOFOL N/A 08/22/2017   Procedure: COLONOSCOPY  WITH PROPOFOL;  Surgeon: [name], MD;  Location: [place];  Service: Endoscopy;  Laterality: N/A;   CORONARY ANGIOPLASTY WITH STENT PLACEMENT  10/31/2011   30 % MID ATRIOVENTRICULAR GROOVE CX STENOSIS. 90 % MID RCA.PTA TO LAD/STENTING OF TANDEM 60-70%. LAD STENOSIS 99% REDUCED TO 0%.3.0 X 32MM PROMUS DES POSTDILATED TO 3.25MM.   CORONARY ANGIOPLASTY WITH STENT PLACEMENT  07/03/2014   "2"   CORONARY STENT INTERVENTION N/A 04/09/2019   Procedure: CORONARY STENT INTERVENTION;  Surgeon: Jettie Booze, MD;  Location: [place] CV LAB;  Service: Cardiovascular;  Laterality: N/A;    CORONARY STENT INTERVENTION N/A 08/22/2019   Procedure: CORONARY STENT INTERVENTION;  Surgeon: Martinique, Peter M, MD;  Location: [place] CV LAB;  Service: Cardiovascular;  Laterality: N/A;   ESOPHAGOGASTRODUODENOSCOPY (EGD) WITH PROPOFOL N/A 03/21/2018   Procedure: ESOPHAGOGASTRODUODENOSCOPY (EGD) WITH PROPOFOL;  Surgeon:  Lobo, MD;  Location: [place];  Service: Endoscopy;  Laterality: N/A;   I & D EXTREMITY Right 10/30/2013   Procedure: IRRIGATION AND DEBRIDEMENT RIGHT SMALL FINGER POSSIBLE SECONDARY WOUND CLOSURE;  Surgeon: [name], MD;  [facility];  Service: Orthopedics;  Laterality: Right;  [name] is available after 4pm   I & D EXTREMITY Right 11/01/2013   Procedure:  IRRIGATION AND DEBRIDEMENT RIGHT  PROXIMAL PHALANGEAL LEVEL AMPUTATION;  Surgeon: [name], MD;  [facility];  Service: Orthopedics;  Laterality: Right;   INCISION AND DRAINAGE Right 10/28/2013   Procedure: INCISION AND DRAINAGE Right small finger;  Surgeon: [name], MD;  [facility];  Service: Orthopedics;  Laterality: Right;   LEFT HEART CATH Left 11/03/2011   Procedure: LEFT HEART CATH;  Surgeon: [name], MD;  Location: [facility] CATH LAB;  Service: Cardiovascular;  Laterality: Left;   LEFT HEART CATH AND CORONARY ANGIOGRAPHY N/A 04/09/2019   Procedure: LEFT HEART CATH AND CORONARY ANGIOGRAPHY;  Surgeon: Jettie Booze, MD;  Location: [place] CV LAB;  Service: Cardiovascular;  Laterality: N/A;   LEFT HEART CATHETERIZATION WITH CORONARY ANGIOGRAM N/A 07/03/2014   Procedure: LEFT HEART CATHETERIZATION WITH CORONARY ANGIOGRAM;  Surgeon: [name], MD;  Location: [facility] CATH LAB;  Service: Cardiovascular;  Laterality: N/A;   PERCUTANEOUS CORONARY STENT INTERVENTION (PCI-S) Left 11/03/2011   Procedure: PERCUTANEOUS CORONARY STENT INTERVENTION (PCI-S);  Surgeon: [name], MD;  Location: [facility] CATH LAB;  Service: Cardiovascular;  Laterality: Left;    RIGHT/LEFT HEART CATH AND CORONARY ANGIOGRAPHY N/A 08/22/2019   Procedure: RIGHT/LEFT HEART CATH AND CORONARY ANGIOGRAPHY;  Surgeon: Martinique, Peter M, MD;  Location: [place] CV LAB;  Service: Cardiovascular;  Laterality: N/A;   TOE AMPUTATION Right ~ 2010   "2nd toe"   TOE AMPUTATION Right 2012   "3rd toe"    There were no vitals filed for this visit.   Subjective Assessment - 06/02/21 1016     Subjective Some back tightness, balance has improved    Currently in Pain? Yes    Pain Score 4     Pain Location Back    Pain Descriptors / Indicators Tightness                               [facility] Adult PT Treatment/Exercise - 06/02/21 0001       Lumbar Exercises: Stretches   Passive Hamstring Stretch Right;Left;4 reps;10 seconds    Single Knee to Chest Stretch Right;Left;4 reps;10 seconds    Lower Trunk Rotation 4  reps;20 seconds    Piriformis Stretch Left;Right;3 reps;10 seconds      Lumbar Exercises: Aerobic   UBE (Upper Arm Bike) L2 2 min each way    Nustep L5 x35mn      Lumbar Exercises: Machines for Strengthening   Leg Press 40lb 3x12      Lumbar Exercises: Standing   Other Standing Lumbar Exercises Rows & Lats 45lb 2x10    Other Standing Lumbar Exercises Overhead trunk extension, Trunk Rotation with yellow wt ball x10 each way      Lumbar Exercises: Supine   Bridge Compliant;10 reps;2 seconds                      PT Short Term Goals - 05/14/21 0837       PT SHORT TERM GOAL #1   Title Patient will be independent with initial HEP.    Status Achieved               PT Long Term Goals - 05/26/21 1151       PT LONG TERM GOAL #1   Title Patient will be independent with advanced HEP.    Status On-going      PT LONG TERM GOAL #2   Title Patient will improve TUG to 13 sec for reduced fall risk.    Status Partially Met      PT LONG TERM GOAL #3   Title Patient will increase BERG to at least 48/56 to place him at a low risk of  falling    Status On-going      PT LONG TERM GOAL #4   Title Patient will increase functional LE and lumbar strength to at least 5/5 to allow him to more easily perform transfers and ambulation.    Status On-going                   Plan - 06/02/21 1054     Clinical Impression Statement Pt reports continued back tightness. Added LE and lumbar stretching to treatment. Pt was very tight in both hips and hamstrings. Some discomfort reported, with lower trunk rotations. Some grimacing with supine bridges but reports that he was ok.    Personal Factors and Comorbidities Age;Fitness    Examination-Activity Limitations Locomotion Level;Bend;Squat;Stairs;Stand    Stability/Clinical Decision Making Evolving/Moderate complexity    Rehab Potential Good    PT Frequency 2x / week    PT Duration 12 weeks    PT Treatment/Interventions ADLs/Self Care Home Management;Cryotherapy;Electrical Stimulation;Iontophoresis 472mml Dexamethasone;Moist Heat;Traction;Ultrasound;Gait training;Stair training;Therapeutic activities;Therapeutic exercise;Balance training;Neuromuscular re-education;Dry needling;Passive range of motion;Spinal Manipulations;Joint Manipulations    PT Next Visit Plan core strength and flexibilty             Patient will benefit from skilled therapeutic intervention in order to improve the following deficits and impairments:  Difficulty walking, Decreased endurance, Increased muscle spasms, Obesity, Decreased activity tolerance, Pain, Decreased balance, Impaired flexibility, Decreased strength, Postural dysfunction  Visit Diagnosis: Difficulty in walking, not elsewhere classified  Repeated falls  Muscle weakness (generalized)  Localized edema  Chronic bilateral low back pain without sciatica     Problem List Patient Active Problem List   Diagnosis Date Noted   Chronic low back pain    Hypokalemia 09/25/2020   Type 2 diabetes mellitus with stage 3 chronic kidney  disease (HCMurch[place]10/13/2021   Unstable angina (HCSt. Hedwig09/03/2019   Chest pain 08/16/2019   NSTEMI (non-ST elevated myocardial infarction) (HCAl[place]04/24/2020   ACS (acute coronary  syndrome) ([place]) 04/06/2019   Sepsis ([place]) 03/15/2018   DKA (diabetic ketoacidoses) 03/15/2018   Special screening for malignant neoplasms, colon 08/22/2017   Acute renal failure superimposed on stage 3 chronic kidney disease ([place]) 05/19/2017   Essential hypertension    Status post transmetatarsal amputation of foot, right ([place]) 11/08/2016   Diabetic polyneuropathy associated with type 2 diabetes mellitus ([place]) 11/08/2016   Pure hypercholesterolemia    AKI (acute kidney injury) ([place])    Acute blood loss anemia    DM type 2 with diabetic peripheral neuropathy ([place])    Physical debility 08/03/2016   Chronic osteomyelitis of right foot ([place])    Cellulitis and abscess of leg, except foot    Penetrating wound of right foot    CKD (chronic kidney disease) stage 3, GFR 30-59 ml/min (HCC)    Unstable angina pectoris ([place]) 07/18/2016   Chronic combined systolic and diastolic congestive heart failure ([place])    Chest pain with moderate risk for cardiac etiology 06/24/2014   CAD S/P multiple PCIs 06/24/2014   Sleep apnea- on C-pap 06/24/2014   Morbid obesity due to excess calories ([place]) 11/10/2013   Hyperglycemia 10/29/2013   Hyperlipidemia 01/12/2011   Benign essential HTN 01/12/2011   GERD 01/12/2011    Scot Jun 06/02/2021, 10:59 AM  [place]. [place], [place], [address] Phone: [phone]   Fax:  [phone]  Name: Joshua [name] MRN: [id number] Date of Birth: 04/15/1956

## 2021-06-04 ENCOUNTER — Ambulatory Visit: Payer: Medicare Other | Admitting: Physical Therapy

## 2021-06-04 ENCOUNTER — Encounter: Payer: Self-pay | Admitting: Physical Therapy

## 2021-06-04 ENCOUNTER — Other Ambulatory Visit: Payer: Self-pay

## 2021-06-04 DIAGNOSIS — R262 Difficulty in walking, not elsewhere classified: Secondary | ICD-10-CM | POA: Diagnosis not present

## 2021-06-04 DIAGNOSIS — R296 Repeated falls: Secondary | ICD-10-CM

## 2021-06-04 DIAGNOSIS — M6281 Muscle weakness (generalized): Secondary | ICD-10-CM

## 2021-06-04 DIAGNOSIS — R600 Localized edema: Secondary | ICD-10-CM

## 2021-06-04 NOTE — Therapy (Addendum)
[place] [place], [place], [address] Phone: [phone]   Fax:  [phone]  Physical Therapy Treatment and Reassessment  Patient Details  Name: Joshua [name] MRN: [id number] Date of Birth: July 15, 1956 Referring Provider (PT): Benjiman Core, [place]  Progress Note Reporting Period 05/04/2021 to 06/04/2021  See note below for Objective Data and Assessment of Progress/Goals.      Encounter Date: 06/04/2021   PT End of Session - 06/04/21 1052     Visit Number 10    Date for PT Re-Evaluation 07/24/21    PT Start Time 0623    PT Stop Time 1100    PT Time Calculation (min) 45 min    Activity Tolerance Patient tolerated treatment well    Behavior During Therapy [facility] for tasks assessed/performed             Past Medical History:  Diagnosis Date   Anemia    Chronic combined systolic and diastolic CHF (congestive heart failure) ([place])    a. 07/2015 Echo: EF 45-50%, mod LVH, mid apical/antsept AK, Gr 1 DD.   CKD (chronic kidney disease), stage III ([place])    Coronary artery disease    a. 2012 s/p MI/PCI: s/p DES to mid/dist RCA and overlapping DES to LAD;  b. 07/2015 Cath: LAD 10% ISR, D1 40(jailed), LCX 65m, OM2 30, OM3 30, RCA 72m ISR, 10d ISR; c. 07/2016 NSTEMI/PCI: LAD 85ost/p/m (3.5x20 Synergy DES overlapping prior stent), D1 40, LCX 70m, OM2 95 (2.75x16 Synergy DES), OM3 30, RCA 58m, 10d.   Depression    Diabetic gastroparesis ([place])    DKA (diabetic ketoacidoses) 03/14/2018   GERD (gastroesophageal reflux disease)    Gout    Heart murmur    Hyperlipemia    Hypertension    Hypertensive heart disease    Ischemic cardiomyopathy    a. 07/2015 Echo: EF 45-50%, mod LVH, mid-apicalanteroseptal AK, Gr1 DD.   Morbid obesity ([place])    OSA on CPAP    Osteomyelitis ([place])    a. 07/2016 MTP joint of R great toe s/p transmetatarsal amputation.   Polyneuropathy in diabetes(357.2)    Pulmonary sarcoidosis ([place])    "problems  w/it years ago; no problems anymore" (07/03/2014)   Rectal bleeding    Sleep apnea    Type II diabetes mellitus ([place])     Past Surgical History:  Procedure Laterality Date   AMPUTATION Right 07/24/2016   Procedure: TRANSMETATARSAL FOOT AMPUTATION;  Surgeon: [name], MD;  Location: [place];  Service: Orthopedics;  Laterality: Right;   BALLOON DILATION N/A 03/21/2018   Procedure: BALLOON DILATION;  Surgeon:  Lobo, MD;  Location: [facility] ENDOSCOPY;  Service: Endoscopy;  Laterality: N/A;   BREAST LUMPECTOMY Right ~ 2000   "benign"   CARDIAC CATHETERIZATION N/A 07/30/2015   Procedure: Left Heart Cath and Coronary Angiography;  Surgeon: Burnell Blanks, MD;  Location: [place] CV LAB;  Service: Cardiovascular;  Laterality: N/A;   CARDIAC CATHETERIZATION N/A 07/19/2016   Procedure: Left Heart Cath and Coronary Angiography;  Surgeon: Belva Crome, MD;  Location: [place] CV LAB;  Service: Cardiovascular;  Laterality: N/A;   CARDIAC CATHETERIZATION N/A 07/29/2016   Procedure: Coronary Stent Intervention;  Surgeon: Belva Crome, MD;  Location: [place] CV LAB;  Service: Cardiovascular;  Laterality: N/A;   CARDIAC CATHETERIZATION N/A 07/29/2016   Procedure: Intravascular Ultrasound/IVUS;  Surgeon: Belva Crome, MD;  Location: [place] CV LAB;  Service: Cardiovascular;  Laterality:  N/A;   CATARACT EXTRACTION W/ INTRAOCULAR LENS  IMPLANT, BILATERAL Bilateral 2014-2015   COLONOSCOPY WITH PROPOFOL N/A 08/22/2017   Procedure: COLONOSCOPY WITH PROPOFOL;  Surgeon: [name], MD;  Location: [place];  Service: Endoscopy;  Laterality: N/A;   CORONARY ANGIOPLASTY WITH STENT PLACEMENT  10/31/2011   30 % MID ATRIOVENTRICULAR GROOVE CX STENOSIS. 90 % MID RCA.PTA TO LAD/STENTING OF TANDEM 60-70%. LAD STENOSIS 99% REDUCED TO 0%.3.0 X 32MM PROMUS DES POSTDILATED TO 3.25MM.   CORONARY ANGIOPLASTY WITH STENT PLACEMENT  07/03/2014   "2"   CORONARY STENT INTERVENTION N/A 04/09/2019    Procedure: CORONARY STENT INTERVENTION;  Surgeon: Jettie Booze, MD;  Location: [place] CV LAB;  Service: Cardiovascular;  Laterality: N/A;   CORONARY STENT INTERVENTION N/A 08/22/2019   Procedure: CORONARY STENT INTERVENTION;  Surgeon: Martinique, Peter M, MD;  Location: [place] CV LAB;  Service: Cardiovascular;  Laterality: N/A;   ESOPHAGOGASTRODUODENOSCOPY (EGD) WITH PROPOFOL N/A 03/21/2018   Procedure: ESOPHAGOGASTRODUODENOSCOPY (EGD) WITH PROPOFOL;  Surgeon:  Lobo, MD;  Location: [place];  Service: Endoscopy;  Laterality: N/A;   I & D EXTREMITY Right 10/30/2013   Procedure: IRRIGATION AND DEBRIDEMENT RIGHT SMALL FINGER POSSIBLE SECONDARY WOUND CLOSURE;  Surgeon: [name], MD;  [facility];  Service: Orthopedics;  Laterality: Right;  [name] is available after 4pm   I & D EXTREMITY Right 11/01/2013   Procedure:  IRRIGATION AND DEBRIDEMENT RIGHT  PROXIMAL PHALANGEAL LEVEL AMPUTATION;  Surgeon: [name], MD;  [facility];  Service: Orthopedics;  Laterality: Right;   INCISION AND DRAINAGE Right 10/28/2013   Procedure: INCISION AND DRAINAGE Right small finger;  Surgeon: [name], MD;  [facility];  Service: Orthopedics;  Laterality: Right;   LEFT HEART CATH Left 11/03/2011   Procedure: LEFT HEART CATH;  Surgeon: [name], MD;  Location: [facility] CATH LAB;  Service: Cardiovascular;  Laterality: Left;   LEFT HEART CATH AND CORONARY ANGIOGRAPHY N/A 04/09/2019   Procedure: LEFT HEART CATH AND CORONARY ANGIOGRAPHY;  Surgeon: Jettie Booze, MD;  Location: [place] CV LAB;  Service: Cardiovascular;  Laterality: N/A;   LEFT HEART CATHETERIZATION WITH CORONARY ANGIOGRAM N/A 07/03/2014   Procedure: LEFT HEART CATHETERIZATION WITH CORONARY ANGIOGRAM;  Surgeon: [name], MD;  Location: [facility] CATH LAB;  Service: Cardiovascular;  Laterality: N/A;   PERCUTANEOUS CORONARY STENT INTERVENTION (PCI-S) Left 11/03/2011   Procedure:  PERCUTANEOUS CORONARY STENT INTERVENTION (PCI-S);  Surgeon: [name], MD;  Location: [facility] CATH LAB;  Service: Cardiovascular;  Laterality: Left;   RIGHT/LEFT HEART CATH AND CORONARY ANGIOGRAPHY N/A 08/22/2019   Procedure: RIGHT/LEFT HEART CATH AND CORONARY ANGIOGRAPHY;  Surgeon: Martinique, Peter M, MD;  Location: [place] CV LAB;  Service: Cardiovascular;  Laterality: N/A;   TOE AMPUTATION Right ~ 2010   "2nd toe"   TOE AMPUTATION Right 2012   "3rd toe"    There were no vitals filed for this visit.   Subjective Assessment - 06/04/21 1018     Subjective Back is a little tight. felt good after stretching. Reports stretching at home    Currently in Pain? Yes    Pain Score 3     Pain Location Back                [facility] PT Assessment - 06/04/21 0001       Berg Balance Test   Sit to Stand Able to stand without using hands and stabilize independently    Standing Unsupported  Able to stand safely 2 minutes    Sitting with Back Unsupported but Feet Supported on Floor or Stool Able to sit safely and securely 2 minutes    Stand to Sit Controls descent by using hands    Transfers Able to transfer safely, definite need of hands    Standing Unsupported with Eyes Closed Able to stand 10 seconds with supervision    Standing Unsupported with Feet Together Able to place feet together independently and stand for 1 minute with supervision    From Standing, Reach Forward with Outstretched Arm Can reach forward >12 cm safely (5")    From Standing Position, Pick up Object from [place] to pick up shoe, needs supervision    From Standing Position, Turn to Look Behind Over each Shoulder Looks behind from both sides and weight shifts well    Turn 360 Degrees Needs close supervision or verbal cueing    Standing Unsupported, Alternately Place Feet on Step/Stool Able to complete 4 steps without aid or supervision    Standing Unsupported, One Foot in Front Needs help to step but can hold 15 seconds     Standing on One Leg Unable to try or needs assist to prevent fall    Total Score 38                           [facility] Adult PT Treatment/Exercise - 06/04/21 0001       Timed Up and Go Test   TUG Normal TUG    Normal TUG (seconds) 11.08      Lumbar Exercises: Aerobic   Recumbent Bike L2 x 2 min    Nustep L5 x45min      Lumbar Exercises: Standing   Other Standing Lumbar Exercises Rows & Lats 45lb 2x15                      PT Short Term Goals - 05/14/21 1660       PT SHORT TERM GOAL #1   Title Patient will be independent with initial HEP.    Status Achieved               PT Long Term Goals - 06/04/21 1045       PT LONG TERM GOAL #3   Title Patient will increase BERG to at least 48/56 to place him at a low risk of falling    Status On-going   38/56                  Plan - 06/04/21 1053     Clinical Impression Statement Pt has progressed towards LTG's. He was able to increase his BERG balance score but still remains a fall risk. He had the most difficulty with tandem and single leg stance. No reports of increase back pain. Will benefit from continues skilled PT to address balance issues.    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 complexity    Rehab Potential Good    PT Frequency 2x / week    PT Duration 12 weeks    PT Treatment/Interventions ADLs/Self Care Home Management;Cryotherapy;Electrical Stimulation;Iontophoresis 4mg /ml Dexamethasone;Moist Heat;Traction;Ultrasound;Gait training;Stair training;Therapeutic activities;Therapeutic exercise;Balance training;Neuromuscular re-education;Dry needling;Passive range of motion;Spinal Manipulations;Joint Manipulations    PT Next Visit Plan core strength and flexibilty  Patient will benefit from skilled therapeutic intervention in order to improve the following deficits and impairments:  Difficulty walking, Decreased endurance, Increased muscle spasms, Obesity, Decreased activity tolerance, Pain, Decreased balance, Impaired flexibility, Decreased strength, Postural dysfunction  Visit Diagnosis: Difficulty in walking, not elsewhere classified  Repeated falls  Localized edema  Muscle weakness (generalized)     Problem List Patient Active Problem List   Diagnosis Date Noted   Chronic low back pain    Hypokalemia 09/25/2020   Type 2 diabetes mellitus with stage 3 chronic kidney disease ([place]) 09/24/2020   Unstable angina ([place]) 08/17/2019   Chest pain 08/16/2019   NSTEMI (non-ST elevated myocardial infarction) ([place]) 04/06/2019   ACS (acute coronary syndrome) ([place]) 04/06/2019   Sepsis ([place]) 03/15/2018   DKA (diabetic ketoacidoses) 03/15/2018   Special screening for malignant neoplasms, colon 08/22/2017   Acute renal failure superimposed on stage 3 chronic kidney disease ([place]) 05/19/2017   Essential hypertension    Status post transmetatarsal amputation of foot, right ([place]) 11/08/2016   Diabetic polyneuropathy associated with type 2 diabetes mellitus ([place]) 11/08/2016   Pure hypercholesterolemia    AKI (acute kidney injury) ([place])    Acute blood loss anemia    DM type 2 with diabetic peripheral neuropathy ([place])    Physical debility 08/03/2016   Chronic osteomyelitis of right foot ([place])    Cellulitis and abscess of leg, except foot    Penetrating wound of right foot    CKD (chronic kidney disease) stage 3, GFR 30-59 ml/min (HCC)    Unstable angina pectoris ([place]) 07/18/2016   Chronic combined systolic and diastolic congestive heart failure ([place])    Chest pain with moderate risk for cardiac etiology 06/24/2014   CAD S/P multiple PCIs 06/24/2014   Sleep apnea- on C-pap 06/24/2014   Morbid obesity due to excess calories ([place]) 11/10/2013    Hyperglycemia 10/29/2013   Hyperlipidemia 01/12/2011   Benign essential HTN 01/12/2011   GERD 01/12/2011    Scot Jun, PTA 06/04/2021, 10:55 AM  Juel Burrow, PT, DPT 06/04/2021, 11:10 AM  [address]. [place], [place], [address] Phone: [phone]   Fax:  [phone]  Name: Joshua [name] MRN: [id number] Date of Birth: [birth year]

## 2021-06-09 ENCOUNTER — Other Ambulatory Visit: Payer: Self-pay

## 2021-06-09 ENCOUNTER — Ambulatory Visit: Payer: Medicare Other | Admitting: Physical Therapy

## 2021-06-09 ENCOUNTER — Encounter: Payer: Self-pay | Admitting: Physical Therapy

## 2021-06-09 DIAGNOSIS — R600 Localized edema: Secondary | ICD-10-CM

## 2021-06-09 DIAGNOSIS — R296 Repeated falls: Secondary | ICD-10-CM

## 2021-06-09 DIAGNOSIS — R262 Difficulty in walking, not elsewhere classified: Secondary | ICD-10-CM

## 2021-06-09 DIAGNOSIS — M6281 Muscle weakness (generalized): Secondary | ICD-10-CM

## 2021-06-09 NOTE — Therapy (Signed)
[address]. [place], [place], [address] Phone: [phone]   Fax:  [phone]  Physical Therapy Treatment  Patient Details  Name: Joshua [name] MRN: [id number] Date of Birth: [birth year] Referring Provider (PT): Benjiman Core, [place]   Encounter Date: 06/09/2021   PT End of Session - 06/09/21 1055     Visit Number 11    Date for PT Re-Evaluation 07/24/21    PT Start Time 2919    PT Stop Time 1056    PT Time Calculation (min) 41 min    Activity Tolerance Patient tolerated treatment well    Behavior During Therapy [facility] for tasks assessed/performed             Past Medical History:  Diagnosis Date   Anemia    Chronic combined systolic and diastolic CHF (congestive heart failure) ([place])    a. 07/2015 Echo: EF 45-50%, mod LVH, mid apical/antsept AK, Gr 1 DD.   CKD (chronic kidney disease), stage III ([place])    Coronary artery disease    a. 2012 s/p MI/PCI: s/p DES to mid/dist RCA and overlapping DES to LAD;  b. 07/2015 Cath: LAD 10% ISR, D1 40(jailed), LCX 20m, OM2 30, OM3 30, RCA 48m ISR, 10d ISR; c. 07/2016 NSTEMI/PCI: LAD 85ost/p/m (3.5x20 Synergy DES overlapping prior stent), D1 40, LCX 92m, OM2 95 (2.75x16 Synergy DES), OM3 30, RCA 44m, 10d.   Depression    Diabetic gastroparesis ([place])    DKA (diabetic ketoacidoses) 03/14/2018   GERD (gastroesophageal reflux disease)    Gout    Heart murmur    Hyperlipemia    Hypertension    Hypertensive heart disease    Ischemic cardiomyopathy    a. 07/2015 Echo: EF 45-50%, mod LVH, mid-apicalanteroseptal AK, Gr1 DD.   Morbid obesity ([place])    OSA on CPAP    Osteomyelitis ([place])    a. 07/2016 MTP joint of R great toe s/p transmetatarsal amputation.   Polyneuropathy in diabetes(357.2)    Pulmonary sarcoidosis ([place])    "problems w/it years ago; no problems anymore" (07/03/2014)   Rectal bleeding    Sleep apnea    Type II diabetes mellitus ([place])     Past Surgical History:   Procedure Laterality Date   AMPUTATION Right 07/24/2016   Procedure: TRANSMETATARSAL FOOT AMPUTATION;  Surgeon: [name], MD;  Location: [place];  Service: Orthopedics;  Laterality: Right;   BALLOON DILATION N/A 03/21/2018   Procedure: BALLOON DILATION;  Surgeon:  Lobo, MD;  Location[facility] ENDOSCOPY;  Service: Endoscopy;  Laterality: N/A;   BREAST LUMPECTOMY Right ~ 2000   "benign"   CARDIAC CATHETERIZATION N/A 07/30/2015   Procedure: Left Heart Cath and Coronary Angiography;  Surgeon: Burnell Blanks, MD;  Location: [place] CV LAB;  Service: Cardiovascular;  Laterality: N/A;   CARDIAC CATHETERIZATION N/A 07/19/2016   Procedure: Left Heart Cath and Coronary Angiography;  Surgeon: Belva Crome, MD;  Location: [place] [facility] LAB;  Service: Cardiovascular;  Laterality: N/A;   CARDIAC CATHETERIZATION N/A 07/29/2016   Procedure: Coronary Stent Intervention;  Surgeon: Belva Crome, MD;  Location: [place] CV LAB;  Service: Cardiovascular;  Laterality: N/A;   CARDIAC CATHETERIZATION N/A 07/29/2016   Procedure: Intravascular Ultrasound/IVUS;  Surgeon: Belva Crome, MD;  Location: [place] [facility] LAB;  Service: Cardiovascular;  Laterality: N/A;   CATARACT EXTRACTION W/ INTRAOCULAR LENS  IMPLANT, BILATERAL Bilateral 2014-2015   COLONOSCOPY WITH PROPOFOL N/A 08/22/2017   Procedure: COLONOSCOPY  WITH PROPOFOL;  Surgeon: [name], MD;  Location: [place];  Service: Endoscopy;  Laterality: N/A;   CORONARY ANGIOPLASTY WITH STENT PLACEMENT  10/31/2011   30 % MID ATRIOVENTRICULAR GROOVE CX STENOSIS. 90 % MID RCA.PTA TO LAD/STENTING OF TANDEM 60-70%. LAD STENOSIS 99% REDUCED TO 0%.3.0 X 32MM PROMUS DES POSTDILATED TO 3.25MM.   CORONARY ANGIOPLASTY WITH STENT PLACEMENT  07/03/2014   "2"   CORONARY STENT INTERVENTION N/A 04/09/2019   Procedure: CORONARY STENT INTERVENTION;  Surgeon: Jettie Booze, MD;  Location: [place] [facility] LAB;  Service: Cardiovascular;  Laterality: N/A;    CORONARY STENT INTERVENTION N/A 08/22/2019   Procedure: CORONARY STENT INTERVENTION;  Surgeon: Martinique, Peter M, MD;  Location: [place] CV LAB;  Service: Cardiovascular;  Laterality: N/A;   ESOPHAGOGASTRODUODENOSCOPY (EGD) WITH PROPOFOL N/A 03/21/2018   Procedure: ESOPHAGOGASTRODUODENOSCOPY (EGD) WITH PROPOFOL;  Surgeon:  Lobo, MD;  Location: [place];  Service: Endoscopy;  Laterality: N/A;   I & D EXTREMITY Right 10/30/2013   Procedure: IRRIGATION AND DEBRIDEMENT RIGHT SMALL FINGER POSSIBLE SECONDARY WOUND CLOSURE;  Surgeon: [name], MD;  [facility];  Service: Orthopedics;  Laterality: Right;  [name] is available after 4pm   I & D EXTREMITY Right 11/01/2013   Procedure:  IRRIGATION AND DEBRIDEMENT RIGHT  PROXIMAL PHALANGEAL LEVEL AMPUTATION;  Surgeon: [name], MD;  [facility];  Service: Orthopedics;  Laterality: Right;   INCISION AND DRAINAGE Right 10/28/2013   Procedure: INCISION AND DRAINAGE Right small finger;  Surgeon: [name], MD;  [facility];  Service: Orthopedics;  Laterality: Right;   LEFT HEART CATH Left 11/03/2011   Procedure: LEFT HEART CATH;  Surgeon: [name], MD;  Location: [facility] CATH LAB;  Service: Cardiovascular;  Laterality: Left;   LEFT HEART CATH AND CORONARY ANGIOGRAPHY N/A 04/09/2019   Procedure: LEFT HEART CATH AND CORONARY ANGIOGRAPHY;  Surgeon: Jettie Booze, MD;  Location: [place] [facility] LAB;  Service: Cardiovascular;  Laterality: N/A;   LEFT HEART CATHETERIZATION WITH CORONARY ANGIOGRAM N/A 07/03/2014   Procedure: LEFT HEART CATHETERIZATION WITH CORONARY ANGIOGRAM;  Surgeon: [name], MD;  Location: [facility] CATH LAB;  Service: Cardiovascular;  Laterality: N/A;   PERCUTANEOUS CORONARY STENT INTERVENTION (PCI-S) Left 11/03/2011   Procedure: PERCUTANEOUS CORONARY STENT INTERVENTION (PCI-S);  Surgeon: [name], MD;  Location: [facility] CATH LAB;  Service: Cardiovascular;  Laterality: Left;    RIGHT/LEFT HEART CATH AND CORONARY ANGIOGRAPHY N/A 08/22/2019   Procedure: RIGHT/LEFT HEART CATH AND CORONARY ANGIOGRAPHY;  Surgeon: Martinique, Peter M, MD;  Location: [place] CV LAB;  Service: Cardiovascular;  Laterality: N/A;   TOE AMPUTATION Right ~ 2010   "2nd toe"   TOE AMPUTATION Right 2012   "3rd toe"    There were no vitals filed for this visit.   Subjective Assessment - 06/09/21 1017     Subjective "Pretty good I guess"    Currently in Pain? Yes    Pain Score 5     Pain Location Back    Pain Orientation Lower;Right                               [facility] Adult PT Treatment/Exercise - 06/09/21 0001       Lumbar Exercises: Aerobic   UBE (Upper Arm Bike) L2 3 min each way      Lumbar Exercises: Machines for Strengthening   Leg Press 40lb [phone]  Other Lumbar Machine Exercise Rows & Lats 45lb 2x10      Lumbar Exercises: Standing   Row Strengthening;Power tower;Both;20 reps    Row Limitations 15    Shoulder Extension Strengthening;Power Tower;Both;20 reps    Shoulder Extension Limitations 15      Lumbar Exercises: Seated   Sit to Stand 5 reps   OHP w/ yello wball                     PT Short Term Goals - 05/14/21 6389       PT SHORT TERM GOAL #1   Title Patient will be independent with initial HEP.    Status Achieved               PT Long Term Goals - 06/04/21 1045       PT LONG TERM GOAL #3   Title Patient will increase BERG to at least 48/56 to place him at a low risk of falling    Status On-going   38/56                  Plan - 06/09/21 1055     Clinical Impression Statement Lumbar tightness remains bu balance has improved. He reports the low back issues has been going on for years. Good strength with today's interventions. Some instability with alt box taps. Pt did have some struggle with sit to stands. Cues for core engagement needed with shoulder extensions.    Personal Factors and Comorbidities  Age;Fitness    Examination-Activity Limitations Locomotion Level;Bend;Squat;Stairs;Stand    Statistician;Shop;Yard Work    Stability/Clinical Decision Making Evolving/Moderate complexity    Rehab Potential Good    PT Frequency 2x / week    PT Duration 12 weeks    PT Next Visit Plan core strength and flexibility             Patient will benefit from skilled therapeutic intervention in order to improve the following deficits and impairments:  Difficulty walking, Decreased endurance, Increased muscle spasms, Obesity, Decreased activity tolerance, Pain, Decreased balance, Impaired flexibility, Decreased strength, Postural dysfunction  Visit Diagnosis: Localized edema  Difficulty in walking, not elsewhere classified  Muscle weakness (generalized)  Repeated falls     Problem List Patient Active Problem List   Diagnosis Date Noted   Chronic low back pain    Hypokalemia 09/25/2020   Type 2 diabetes mellitus with stage 3 chronic kidney disease ([place]) 09/24/2020   Unstable angina ([place]) 08/17/2019   Chest pain 08/16/2019   NSTEMI (non-ST elevated myocardial infarction) ([place]) 04/06/2019   ACS (acute coronary syndrome) ([place]) 04/06/2019   Sepsis ([place]) 03/15/2018   DKA (diabetic ketoacidoses) 03/15/2018   Special screening for malignant neoplasms, colon 08/22/2017   Acute renal failure superimposed on stage 3 chronic kidney disease ([place]) 05/19/2017   Essential hypertension    Status post transmetatarsal amputation of foot, right ([place]) 11/08/2016   Diabetic polyneuropathy associated with type 2 diabetes mellitus ([place]) 11/08/2016   Pure hypercholesterolemia    AKI (acute kidney injury) ([place])    Acute blood loss anemia    DM type 2 with diabetic peripheral neuropathy ([place])    Physical debility 08/03/2016   Chronic osteomyelitis of right foot (HCC)    Cellulitis and abscess of leg, except foot    Penetrating wound of right foot    CKD  (chronic kidney disease) stage 3, GFR 30-59 ml/min (HCC)    Unstable angina pectoris ([place]) 07/18/2016  Chronic combined systolic and diastolic congestive heart failure (HCC)    Chest pain with moderate risk for cardiac etiology 06/24/2014   CAD S/P multiple PCIs 06/24/2014   Sleep apnea- on C-pap 06/24/2014   Morbid obesity due to excess calories ([place]) 11/10/2013   Hyperglycemia 10/29/2013   Hyperlipidemia 01/12/2011   Benign essential HTN 01/12/2011   GERD 01/12/2011    Scot Jun, PTA 06/09/2021, 10:59 AM  [place]. [place], [place], [address] Phone: [phone]   Fax:  [phone]  Name: AQIB LOUGH MRN: [id number] Date of Birth: Jun 11, 1956

## 2021-06-11 ENCOUNTER — Other Ambulatory Visit: Payer: Self-pay

## 2021-06-11 ENCOUNTER — Encounter: Payer: Self-pay | Admitting: Rehabilitative and Restorative Service Providers"

## 2021-06-11 ENCOUNTER — Ambulatory Visit: Payer: Medicare Other | Admitting: Rehabilitative and Restorative Service Providers"

## 2021-06-11 DIAGNOSIS — G8929 Other chronic pain: Secondary | ICD-10-CM

## 2021-06-11 DIAGNOSIS — R262 Difficulty in walking, not elsewhere classified: Secondary | ICD-10-CM | POA: Diagnosis not present

## 2021-06-11 DIAGNOSIS — M6281 Muscle weakness (generalized): Secondary | ICD-10-CM

## 2021-06-11 DIAGNOSIS — R296 Repeated falls: Secondary | ICD-10-CM

## 2021-06-11 NOTE — Therapy (Signed)
[place]. [place], [place], [address] Phone: [phone]   Fax:  [phone]  Physical Therapy Treatment  Patient Details  Name: Joshua [name] MRN: [id number] Date of Birth: [birth year] Referring Provider (PT): Benjiman Core, [place]   Encounter Date: 06/11/2021   PT End of Session - 06/11/21 1013     Visit Number 12    Date for PT Re-Evaluation 07/24/21    PT Start Time 1008    PT Stop Time 1050    PT Time Calculation (min) 42 min    Activity Tolerance Patient tolerated treatment well    Behavior During Therapy [facility] for tasks assessed/performed             Past Medical History:  Diagnosis Date   Anemia    Chronic combined systolic and diastolic CHF (congestive heart failure) ([place])    a. 07/2015 Echo: EF 45-50%, mod LVH, mid apical/antsept AK, Gr 1 DD.   CKD (chronic kidney disease), stage III ([place])    Coronary artery disease    a. 2012 s/p MI/PCI: s/p DES to mid/dist RCA and overlapping DES to LAD;  b. 07/2015 Cath: LAD 10% ISR, D1 40(jailed), LCX 31m, OM2 30, OM3 30, RCA 43m ISR, 10d ISR; c. 07/2016 NSTEMI/PCI: LAD 85ost/p/m (3.5x20 Synergy DES overlapping prior stent), D1 40, LCX 61m, OM2 95 (2.75x16 Synergy DES), OM3 30, RCA 46m, 10d.   Depression    Diabetic gastroparesis ([place])    DKA (diabetic ketoacidoses) 03/14/2018   GERD (gastroesophageal reflux disease)    Gout    Heart murmur    Hyperlipemia    Hypertension    Hypertensive heart disease    Ischemic cardiomyopathy    a. 07/2015 Echo: EF 45-50%, mod LVH, mid-apicalanteroseptal AK, Gr1 DD.   Morbid obesity ([place])    OSA on CPAP    Osteomyelitis ([employer])    a. 07/2016 MTP joint of R great toe s/p transmetatarsal amputation.   Polyneuropathy in diabetes(357.2)    Pulmonary sarcoidosis ([place])    "problems w/it years ago; no problems anymore" (07/03/2014)   Rectal bleeding    Sleep apnea    Type II diabetes mellitus ([place])     Past Surgical History:   Procedure Laterality Date   AMPUTATION Right 07/24/2016   Procedure: TRANSMETATARSAL FOOT AMPUTATION;  Surgeon: [name], MD;  Location: [place];  Service: Orthopedics;  Laterality: Right;   BALLOON DILATION N/A 03/21/2018   Procedure: BALLOON DILATION;  Surgeon: [name], MD;  Location: [facility] ENDOSCOPY;  Service: Endoscopy;  Laterality: N/A;   BREAST LUMPECTOMY Right ~ 2000   "benign"   CARDIAC CATHETERIZATION N/A 07/30/2015   Procedure: Left Heart Cath and Coronary Angiography;  Surgeon: Burnell Blanks, MD;  Location: [place] [facility];  Service: Cardiovascular;  Laterality: N/A;   CARDIAC CATHETERIZATION N/A 07/19/2016   Procedure: Left Heart Cath and Coronary Angiography;  Surgeon: Belva Crome, MD;  Location: [place] [facility];  Service: Cardiovascular;  Laterality: N/A;   CARDIAC CATHETERIZATION N/A 07/29/2016   Procedure: Coronary Stent Intervention;  Surgeon: Belva Crome, MD;  Location: [place] [facility];  Service: Cardiovascular;  Laterality: N/A;   CARDIAC CATHETERIZATION N/A 07/29/2016   Procedure: Intravascular Ultrasound/IVUS;  Surgeon: Belva Crome, MD;  Location: [place] [facility];  Service: Cardiovascular;  Laterality: N/A;   CATARACT EXTRACTION W/ INTRAOCULAR LENS  IMPLANT, BILATERAL Bilateral 2014-2015   COLONOSCOPY WITH PROPOFOL N/A 08/22/2017   Procedure: COLONOSCOPY  WITH PROPOFOL;  Surgeon: [name], MD;  Location: [facility];  Service: Endoscopy;  Laterality: N/A;   CORONARY ANGIOPLASTY WITH STENT PLACEMENT  10/31/2011   30 % MID ATRIOVENTRICULAR GROOVE CX STENOSIS. 90 % MID RCA.PTA TO LAD/STENTING OF TANDEM 60-70%. LAD STENOSIS 99% REDUCED TO 0%.3.0 X 32MM PROMUS DES POSTDILATED TO 3.25MM.   CORONARY ANGIOPLASTY WITH STENT PLACEMENT  07/03/2014   "2"   CORONARY STENT INTERVENTION N/A 04/09/2019   Procedure: CORONARY STENT INTERVENTION;  Surgeon: Jettie Booze, MD;  Location: [facility];  Service: Cardiovascular;  Laterality: N/A;    CORONARY STENT INTERVENTION N/A 08/22/2019   Procedure: CORONARY STENT INTERVENTION;  Surgeon: Martinique, Peter M, MD;  Location: [facility];  Service: Cardiovascular;  Laterality: N/A;   ESOPHAGOGASTRODUODENOSCOPY (EGD) WITH PROPOFOL N/A 03/21/2018   Procedure: ESOPHAGOGASTRODUODENOSCOPY (EGD) WITH PROPOFOL;  Surgeon: [name], MD;  Location: [facility];  Service: Endoscopy;  Laterality: N/A;   I & D EXTREMITY Right 10/30/2013   Procedure: IRRIGATION AND DEBRIDEMENT RIGHT SMALL FINGER POSSIBLE SECONDARY WOUND CLOSURE;  Surgeon: [name], MD;  Location: [facility];  Service: Orthopedics;  Laterality: Right;  [name] is available after 4pm   I & D EXTREMITY Right 11/01/2013   Procedure:  IRRIGATION AND DEBRIDEMENT RIGHT  PROXIMAL PHALANGEAL LEVEL AMPUTATION;  Surgeon: [name], MD;  Location: [facility];  Service: Orthopedics;  Laterality: Right;   INCISION AND DRAINAGE Right 10/28/2013   Procedure: INCISION AND DRAINAGE Right small finger;  Surgeon: [name], MD;  Location: [facility];  Service: Orthopedics;  Laterality: Right;   LEFT HEART CATH Left 11/03/2011   Procedure: LEFT HEART CATH;  Surgeon: [name], MD;  Location[facility] CATH LAB;  Service: Cardiovascular;  Laterality: Left;   LEFT HEART CATH AND CORONARY ANGIOGRAPHY N/A 04/09/2019   Procedure: LEFT HEART CATH AND CORONARY ANGIOGRAPHY;  Surgeon: Jettie Booze, MD;  Location: [place] [facility];  Service: Cardiovascular;  Laterality: N/A;   LEFT HEART CATHETERIZATION WITH CORONARY ANGIOGRAM N/A 07/03/2014   Procedure: LEFT HEART CATHETERIZATION WITH CORONARY ANGIOGRAM;  Surgeon: [name], MD;  Location[facility] CATH LAB;  Service: Cardiovascular;  Laterality: N/A;   PERCUTANEOUS CORONARY STENT INTERVENTION (PCI-S) Left 11/03/2011   Procedure: PERCUTANEOUS CORONARY STENT INTERVENTION (PCI-S);  Surgeon: [name], MD;  Location: [facility] CATH LAB;  Service: Cardiovascular;  Laterality: Left;    RIGHT/LEFT HEART CATH AND CORONARY ANGIOGRAPHY N/A 08/22/2019   Procedure: RIGHT/LEFT HEART CATH AND CORONARY ANGIOGRAPHY;  Surgeon: Martinique, Peter M, MD;  Location: [facility];  Service: Cardiovascular;  Laterality: N/A;   TOE AMPUTATION Right ~ 2010   "2nd toe"   TOE AMPUTATION Right 2012   "3rd toe"    There were no vitals filed for this visit.   Subjective Assessment - 06/11/21 1012     Subjective I am doing okay, my shoulder is bothering me though    Patient Stated Goals I would love to be able to walk and stand and sit up straight for a while without my back bothering me.    Currently in Pain? Yes    Pain Score 3     Pain Location Back    Pain Orientation Lower;Right    Pain Descriptors / Indicators Tightness;Aching    Multiple Pain Sites Yes    Pain Score 6    Pain Location Shoulder    Pain Orientation Right    Pain Descriptors / Indicators Aching  [facility] PT Assessment - 06/11/21 0001       Observation/Other Assessments   Focus on Therapeutic Outcomes (FOTO)  45%                           [facility] Adult PT Treatment/Exercise - 06/11/21 0001       Lumbar Exercises: Aerobic   UBE (Upper Arm Bike) L2 3 min each way    Nustep L5 x51min      Lumbar Exercises: Machines for Strengthening   Leg Press 40lb 3x12    Other Lumbar Machine Exercise Rows & Lats 35lb 2x10.  Back ext with black tband 2x10      Lumbar Exercises: Standing   Shoulder Extension Strengthening;Power Tower;Both;20 reps    Shoulder Extension Limitations 15#      Lumbar Exercises: Quadruped   Straight Leg Raise 10 reps;1 second                      PT Short Term Goals - 05/14/21 7711       PT SHORT TERM GOAL #1   Title Patient will be independent with initial HEP.    Status Achieved               PT Long Term Goals - 06/11/21 1054       PT LONG TERM GOAL #1   Title Patient will be independent with advanced HEP.    Status On-going       PT LONG TERM GOAL #3   Title Patient will increase BERG to at least 48/56 to place him at a low risk of falling    Status On-going      PT LONG TERM GOAL #4   Title Patient will increase functional LE and lumbar strength to at least 5/5 to allow him to more easily perform transfers and ambulation.    Status On-going                   Plan - 06/11/21 1052     Clinical Impression Statement Patient is reporting improved lumbar pain, but does have some R shoulder pain.  He states that he has had this shoulder pain in the past and has received steroid injections.  Patient able to perform quadruped LE extension with cuing for maintaining core stability.  He has improved his FOTO 5% since initial evaluation.  He continues to require skilled PT to progress towards goal related activities.    PT Treatment/Interventions ADLs/Self Care Home Management;Cryotherapy;Electrical Stimulation;Iontophoresis 4mg /ml Dexamethasone;Moist Heat;Traction;Ultrasound;Gait training;Stair training;Therapeutic activities;Therapeutic exercise;Balance training;Neuromuscular re-education;Dry needling;Passive range of motion;Spinal Manipulations;Joint Manipulations    PT Next Visit Plan core strength and flexibilty             Patient will benefit from skilled therapeutic intervention in order to improve the following deficits and impairments:  Difficulty walking, Decreased endurance, Increased muscle spasms, Obesity, Decreased activity tolerance, Pain, Decreased balance, Impaired flexibility, Decreased strength, Postural dysfunction  Visit Diagnosis: Muscle weakness (generalized)  Difficulty in walking, not elsewhere classified  Repeated falls  Chronic bilateral low back pain without sciatica     Problem List Patient Active Problem List   Diagnosis Date Noted   Chronic low back pain    Hypokalemia 09/25/2020   Type 2 diabetes mellitus with stage 3 chronic kidney disease ([place]) 09/24/2020    Unstable angina ([place]) 08/17/2019   Chest pain 08/16/2019   NSTEMI (non-ST elevated myocardial infarction) ([place]) 04/06/2019  ACS (acute coronary syndrome) ([place]) 04/06/2019   Sepsis ([place]) 03/15/2018   DKA (diabetic ketoacidoses) 03/15/2018   Special screening for malignant neoplasms, colon 08/22/2017   Acute renal failure superimposed on stage 3 chronic kidney disease ([place]) 05/19/2017   Essential hypertension    Status post transmetatarsal amputation of foot, right ([place]) 11/08/2016   Diabetic polyneuropathy associated with type 2 diabetes mellitus ([place]) 11/08/2016   Pure hypercholesterolemia    AKI (acute kidney injury) ([place])    Acute blood loss anemia    DM type 2 with diabetic peripheral neuropathy ([place])    Physical debility 08/03/2016   Chronic osteomyelitis of right foot ([place])    Cellulitis and abscess of leg, except foot    Penetrating wound of right foot    CKD (chronic kidney disease) stage 3, GFR 30-59 ml/min ([facility])    Unstable angina pectoris ([place]) 07/18/2016   Chronic combined systolic and diastolic congestive heart failure ([place])    Chest pain with moderate risk for cardiac etiology 06/24/2014   CAD S/P multiple PCIs 06/24/2014   Sleep apnea- on C-pap 06/24/2014   Morbid obesity due to excess calories ([place]) 11/10/2013   Hyperglycemia 10/29/2013   Hyperlipidemia 01/12/2011   Benign essential HTN 01/12/2011   GERD 01/12/2011    Juel Burrow, PT, DPT 06/11/2021, 10:56 AM  [place]. [place], [place], [address] Phone: [phone]   Fax:  [phone]  Name: Joshua [name] MRN: [id number] Date of Birth: November 19, 1956

## 2021-06-17 ENCOUNTER — Other Ambulatory Visit: Payer: Self-pay

## 2021-06-17 ENCOUNTER — Encounter: Payer: Self-pay | Admitting: Internal Medicine

## 2021-06-17 ENCOUNTER — Ambulatory Visit (INDEPENDENT_AMBULATORY_CARE_PROVIDER_SITE_OTHER): Payer: Medicare Other | Admitting: Internal Medicine

## 2021-06-17 VITALS — BP 154/90 | HR 70 | Ht 69.0 in | Wt 302.0 lb

## 2021-06-17 DIAGNOSIS — E785 Hyperlipidemia, unspecified: Secondary | ICD-10-CM

## 2021-06-17 DIAGNOSIS — E669 Obesity, unspecified: Secondary | ICD-10-CM | POA: Diagnosis not present

## 2021-06-17 DIAGNOSIS — E1165 Type 2 diabetes mellitus with hyperglycemia: Secondary | ICD-10-CM

## 2021-06-17 DIAGNOSIS — E1159 Type 2 diabetes mellitus with other circulatory complications: Secondary | ICD-10-CM | POA: Diagnosis not present

## 2021-06-17 LAB — POCT GLYCOSYLATED HEMOGLOBIN (HGB A1C): Hemoglobin A1C: 9.1 % — AB (ref 4.0–5.6)

## 2021-06-17 MED ORDER — OZEMPIC (1 MG/DOSE) 4 MG/3ML ~~LOC~~ SOPN: 1.0000 mg | PEN_INJECTOR | SUBCUTANEOUS | 3 refills | Status: DC

## 2021-06-17 MED ORDER — BASAGLAR KWIKPEN 100 UNIT/ML ~~LOC~~ SOPN
PEN_INJECTOR | SUBCUTANEOUS | 3 refills | Status: DC
Start: 2021-06-17 — End: 2021-07-27

## 2021-06-17 NOTE — Addendum Note (Signed)
Addended by: Tyrone Apple on: 06/17/2021 09:32 AM   Modules accepted: Orders

## 2021-06-17 NOTE — Progress Notes (Signed)
[name] ID: Joshua [name], male   DOB: 1956/09/14, 65 y.o.   MRN: [id number]   This visit occurred during the SARS-CoV-2 public health emergency.  Safety protocols were in place, including screening questions prior to the visit, additional usage of staff PPE, and extensive cleaning of exam room while observing appropriate contact time as indicated for disinfecting solutions.   HPI: Joshua [name] is a 65 y.o.-year-old male, initially referred by his PCP, Dr. Alyson Ingles, returning for follow-up for DM2, dx in 1990s, insulin-dependent, uncontrolled, with complications (CAD - s/p AMI 2012, s/p PCIs, ischemic cardiomyopathy, CHF, CKD stage 4, PN - s/p R transmetatarsal amputation, gastroparesis, DR, history of DKA). He saw Dr Dwyane Dee initially, then Dr. Chalmers Cater for 20 years, but she was not accepting Medicaid patients anymore so he had to switch providers.  Last visit with me 4 months ago. Since last visit, he changed his insurance to [employer].  Interim history: No increased urination, blurry vision, nausea.  Had some chest pain after running out of Protonix >> now refilled >> CP resolved.  He still has back pain.  He saw orthopedics and had an MRI.  No need for surgery for now. Going to PT >> better pain and balance. He was able to lose weight since last visit, with help from the nutritionist.  Reviewed HbA1c levels: Lab Results  Component Value Date   HGBA1C 8.3 (A) 02/10/2021   HGBA1C 7.5 (A) 08/15/2020   HGBA1C 8.9 (A) 05/15/2020   HGBA1C 13.8 (A) 01/28/2020   HGBA1C 10.2 (H) 08/16/2019   HGBA1C 15.0 (H) 04/07/2019   HGBA1C >15.5 (H) 03/15/2018   HGBA1C 11.8 (H) 05/19/2017   HGBA1C 8.5 (H) 11/17/2016   HGBA1C 9.5 (H) 07/18/2016   He was on: - Lantus 20 units 2x a day - NovoLog 10 units 3x a day, before meals Prev. On 70/30 until Spring 2020.  Currently on: - Lantus >> Basaglar 30 >> 20-25 >> 30 units a day 2x a day - NovoLog >> Humalog 10-15 >> 10-20 >> 10-12  units before meals - misses doses, but tries to take it before meals - Victoza 1.8 mg daily before breakfast - started 01/2020 Ozempic was not covered by his insurance  He checks sugars 1-2 times a day -sugars improved in the last week, <190s. - am: 50, ave 130 >> 163, 290, 329 >> 84 >> n/c >> 120, 171  - 2h after b'fast: n/c - before lunch: 60, 90, 120, 150-190 >> 140-180 >> 99, 134, 191-216 - 2h after lunch: n/c >> 274 >> n/c >> 304 - before dinner: n/c >> 134, 217 >> 151 >> n/c - 2h after dinner: n/c >> 145 - bedtime: n/c >> 285 >> 213, 336 >> 353 >> 215- 500s >> 189, 268, 309 - nighttime: n/c >> HI >> 73-215 >> 121 Lowest sugar was 50 >> ...>> 60s (took insulin after he ate) >> 99; he has hypoglycemia awareness at 100.  Highest sugar was 353 (no NovoLog) >> 538 >> 389.   Glucometer: ReliOn  Pt's meals are: - Breakfast: skips - Lunch: may skip, PB or chicken - Dinner: chicken w/o sides or with + veggies + starch;  - Snacks: occasional sweets: Joshua [name] bar, sweet tea, cool aid He drinks diet sodas and water.  -+ CKD stage IV- Dr [name], last BUN/creatinine:  Lab Results  Component Value Date   BUN 51 (H) 09/28/2020   BUN 55 (H) 09/27/2020   CREATININE 3.24 (H) 09/28/2020  CREATININE 3.85 (H) 09/27/2020  10/18/2019: 24/2.92 10/18/2019: ACR 765.3  Lab Results  Component Value Date   GFRAA 27 (L) 08/23/2019   GFRAA 25 (L) 08/22/2019   GFRAA 24 (L) 08/21/2019   GFRAA 22 (L) 08/20/2019   GFRAA 29 (L) 08/19/2019  Not on ACE inhibitor/ARB  -+ HL; last set of lipids: 09/05/2020: 112/137/38/49 10/18/2019: 143/105/53/69 Lab Results  Component Value Date   CHOL 168 08/17/2019   HDL 85 08/17/2019   LDLCALC 71 08/17/2019   TRIG 60 08/17/2019   CHOLHDL 2.0 08/17/2019  On Crestor 40, Zetia 10.  - last eye exam was in 02/2021: + DR. Had cataract sx in 2014, 2015.  -He has numbness in his feet.  He also has significant back pain.  He sees neurology for peripheral neuropathy,  leg pain, instability.  Now on gabapentin.  On ASA 81.  Latest TSH:  Lab Results  Component Value Date   TSH 3.360 06/09/2020   Pt has FH of DM in [place].  He also has a history of HTN, OSA, GERD, esophageal ulcers.  No h/o pancreatitis or FH of MTC.  He is in a play in [place].  ROS: Constitutional: + weight loss, no fatigue, no subjective hyperthermia, no subjective hypothermia Eyes: no blurry vision, no xerophthalmia ENT: no sore throat, no nodules palpated in neck, no dysphagia, no odynophagia, no hoarseness Cardiovascular: no CP/+ SOB/no palpitations/+ improved leg swelling Respiratory: no cough/+ SOB/no wheezing Gastrointestinal: no N/no V/no D/no C/+ acid reflux Musculoskeletal: + muscle aches/+ joint aches Skin: no rashes, no hair loss Neurological: no tremors/+ numbness/+ tingling/no dizziness  I reviewed pt's medications, allergies, PMH, social hx, family hx, and changes were documented in the history of present illness. Otherwise, unchanged from my initial visit note.  Past Medical History:  Diagnosis Date   Anemia    Chronic combined systolic and diastolic CHF (congestive heart failure) ([place])    a. 07/2015 Echo: EF 45-50%, mod LVH, mid apical/antsept AK, Gr 1 DD.   CKD (chronic kidney disease), stage III ([place])    Coronary artery disease    a. 2012 s/p MI/PCI: s/p DES to mid/dist RCA and overlapping DES to LAD;  b. 07/2015 Cath: LAD 10% ISR, D1 40(jailed), LCX 39m OM2 30, OM3 30, RCA 561mSR, 10d ISR; c. 07/2016 NSTEMI/PCI: LAD 85ost/p/m (3.5x20 Synergy DES overlapping prior stent), D1 40, LCX 7556mM2 95 (2.75x16 Synergy DES), OM3 30, RCA 36m53md.   Depression    Diabetic gastroparesis (HCC)[place] DKA (diabetic ketoacidoses) 03/14/2018   GERD (gastroesophageal reflux disease)    Gout    Heart murmur    Hyperlipemia    Hypertension    Hypertensive heart disease    Ischemic cardiomyopathy    a. 07/2015 Echo: EF 45-50%, mod LVH, mid-apicalanteroseptal AK, Gr1 DD.    Morbid obesity (HCC)[place] OSA on CPAP    Osteomyelitis (HCC)[place] a. 07/2016 MTP joint of R great toe s/p transmetatarsal amputation.   Polyneuropathy in diabetes(357.2)    Pulmonary sarcoidosis (HCC)[place] "problems w/it years ago; no problems anymore" (07/03/2014)   Rectal bleeding    Sleep apnea    Type II diabetes mellitus (HCC)[place] Past Surgical History:  Procedure Laterality Date   AMPUTATION Right 07/24/2016   Procedure: TRANSMETATARSAL FOOT AMPUTATION;  Surgeon: MarcNewt Minion;  [facility];  Laterality: Right;   BALLOON DILATION N/A 03/21/2018   Procedure:  BALLOON DILATION;  Surgeon: [name], MD;  Location: [facility] ENDOSCOPY;  Service: Endoscopy;  Laterality: N/A;   BREAST LUMPECTOMY Right ~ 2000   "benign"   CARDIAC CATHETERIZATION N/A 07/30/2015   Procedure: Left Heart Cath and Coronary Angiography;  Surgeon: Burnell Blanks, MD;  Location: [place] CV LAB;  Service: Cardiovascular;  Laterality: N/A;   CARDIAC CATHETERIZATION N/A 07/19/2016   Procedure: Left Heart Cath and Coronary Angiography;  Surgeon: Belva Crome, MD;  Location: [place] CV LAB;  Service: Cardiovascular;  Laterality: N/A;   CARDIAC CATHETERIZATION N/A 07/29/2016   Procedure: Coronary Stent Intervention;  Surgeon: Belva Crome, MD;  Location: [place] CV LAB;  Service: Cardiovascular;  Laterality: N/A;   CARDIAC CATHETERIZATION N/A 07/29/2016   Procedure: Intravascular Ultrasound/IVUS;  Surgeon: Belva Crome, MD;  Location: [place] CV LAB;  Service: Cardiovascular;  Laterality: N/A;   CATARACT EXTRACTION W/ INTRAOCULAR LENS  IMPLANT, BILATERAL Bilateral 2014-2015   COLONOSCOPY WITH PROPOFOL N/A 08/22/2017   Procedure: COLONOSCOPY WITH PROPOFOL;  Surgeon: [name], MD;  Location: [place];  Service: Endoscopy;  Laterality: N/A;   CORONARY ANGIOPLASTY WITH STENT PLACEMENT  10/31/2011   30 % MID ATRIOVENTRICULAR GROOVE CX STENOSIS. 90 % MID RCA.PTA TO  LAD/STENTING OF TANDEM 60-70%. LAD STENOSIS 99% REDUCED TO 0%.3.0 X 32MM PROMUS DES POSTDILATED TO 3.25MM.   CORONARY ANGIOPLASTY WITH STENT PLACEMENT  07/03/2014   "2"   CORONARY STENT INTERVENTION N/A 04/09/2019   Procedure: CORONARY STENT INTERVENTION;  Surgeon: Jettie Booze, MD;  Location: [place] CV LAB;  Service: Cardiovascular;  Laterality: N/A;   CORONARY STENT INTERVENTION N/A 08/22/2019   Procedure: CORONARY STENT INTERVENTION;  Surgeon: Martinique, Peter M, MD;  Location: [place] CV LAB;  Service: Cardiovascular;  Laterality: N/A;   ESOPHAGOGASTRODUODENOSCOPY (EGD) WITH PROPOFOL N/A 03/21/2018   Procedure: ESOPHAGOGASTRODUODENOSCOPY (EGD) WITH PROPOFOL;  Surgeon: [name], MD;  Location: [place];  Service: Endoscopy;  Laterality: N/A;   I & D EXTREMITY Right 10/30/2013   Procedure: IRRIGATION AND DEBRIDEMENT RIGHT SMALL FINGER POSSIBLE SECONDARY WOUND CLOSURE;  Surgeon: [name], MD;  Location: [facility];  Service: Orthopedics;  Laterality: Right;  [name] is available after 4pm   I & D EXTREMITY Right 11/01/2013   Procedure:  IRRIGATION AND DEBRIDEMENT RIGHT  PROXIMAL PHALANGEAL LEVEL AMPUTATION;  Surgeon: [name], MD;  Location: [facility];  Service: Orthopedics;  Laterality: Right;   INCISION AND DRAINAGE Right 10/28/2013   Procedure: INCISION AND DRAINAGE Right small finger;  Surgeon: [name], MD;  Location: [facility];  Service: Orthopedics;  Laterality: Right;   LEFT HEART CATH Left 11/03/2011   Procedure: LEFT HEART CATH;  Surgeon: [name], MD;  Location: [facility] CATH LAB;  Service: Cardiovascular;  Laterality: Left;   LEFT HEART CATH AND CORONARY ANGIOGRAPHY N/A 04/09/2019   Procedure: LEFT HEART CATH AND CORONARY ANGIOGRAPHY;  Surgeon: Jettie Booze, MD;  Location: [place] CV LAB;  Service: Cardiovascular;  Laterality: N/A;   LEFT HEART CATHETERIZATION WITH CORONARY ANGIOGRAM N/A 07/03/2014   Procedure: LEFT HEART  CATHETERIZATION WITH CORONARY ANGIOGRAM;  Surgeon: [name], MD;  Location: [facility] CATH LAB;  Service: Cardiovascular;  Laterality: N/A;   PERCUTANEOUS CORONARY STENT INTERVENTION (PCI-S) Left 11/03/2011   Procedure: PERCUTANEOUS CORONARY STENT INTERVENTION (PCI-S);  Surgeon: [name], MD;  Location: [facility] CATH LAB;  Service: Cardiovascular;  Laterality: Left;   RIGHT/LEFT HEART CATH AND CORONARY ANGIOGRAPHY N/A 08/22/2019  Procedure: RIGHT/LEFT HEART CATH AND CORONARY ANGIOGRAPHY;  Surgeon: Martinique, Peter M, MD;  Location: [place] CV LAB;  Service: Cardiovascular;  Laterality: N/A;   TOE AMPUTATION Right ~ 2010   "2nd toe"   TOE AMPUTATION Right 2012   "3rd toe"   Social History   Socioeconomic History   Marital status: Married    Spouse name: Not on file   Number of children: Not on file   Years of education: Not on file   Highest education level: Not on file  Occupational History   Not on file  Tobacco Use   Smoking status: Never   Smokeless tobacco: Never  Vaping Use   Vaping Use: Never used  Substance and Sexual Activity   Alcohol use: Yes    Comment: social    Drug use: No   Sexual activity: Yes  Other Topics Concern   Not on file  Social History Narrative   Not on file   Social Determinants of Health   Financial Resource Strain: Not on file  Food Insecurity: Not on file  Transportation Needs: Not on file  Physical Activity: Not on file  Stress: Not on file  Social Connections: Not on file  Intimate Partner Violence: Not on file   Current Outpatient Medications on File Prior to Visit  Medication Sig Dispense Refill   Accu-Chek FastClix Lancets MISC 1 each by Other route in the morning, at noon, in the evening, and at bedtime.      ACCU-CHEK GUIDE test strip USE UP TO FOUR TIMES DAILY AS DIRECTED (Patient taking differently: 1 each by Other route See admin instructions. USE UP TO FOUR TIMES DAILY AS DIRECTED) 300 each 3   acetaminophen (TYLENOL) 650 MG  CR tablet Take 1,300 mg by mouth daily as needed for pain.     allopurinol (ZYLOPRIM) 300 MG tablet Take 300 mg by mouth daily.      amLODipine (NORVASC) 10 MG tablet Take 1 tablet by mouth once daily 90 tablet 3   aspirin EC 81 MG tablet Take 81 mg by mouth daily.      blood glucose meter kit and supplies Dispense based on patient and insurance preference. Use up to four times daily as directed. (FOR ICD-10 E10.9, E11.9). (Patient taking differently: 1 each by Other route See admin instructions. Dispense based on patient and insurance preference. Use up to four times daily as directed. (FOR ICD-10 E10.9, E11.9).) 1 each 0   famotidine (PEPCID) 20 MG tablet Take 20 mg by mouth at bedtime.     furosemide (LASIX) 40 MG tablet Take 40 mg by mouth 2 (two) times daily.     gabapentin (NEURONTIN) 100 MG capsule Take 1 capsule (100 mg total) by mouth at bedtime. 30 capsule 5   insulin aspart (NOVOLOG) 100 UNIT/ML injection 5-8 units     Insulin Glargine (BASAGLAR KWIKPEN) 100 UNIT/ML INJECT 20 UNITS SUBCUTANEOUSLY TWICE DAILY 15 mL 3   metoprolol succinate (TOPROL XL) 25 MG 24 hr tablet Take 3 tablets (75 mg total) by mouth daily. 270 tablet 3   nitroGLYCERIN (NITROSTAT) 0.4 MG SL tablet Place 1 tablet (0.4 mg total) under the tongue every 5 (five) minutes as needed for chest pain. 25 tablet 1   pantoprazole (PROTONIX) 40 MG tablet TAKE 1 TABLET BY MOUTH TWICE DAILY BEFORE MEAL(S) 180 tablet 0   ticagrelor (BRILINTA) 90 MG TABS tablet Take 1 tablet (90 mg total) by mouth 2 (two) times daily. 180 tablet 3  tretinoin (RETIN-A) 0.1 % cream Apply topically daily.     VICTOZA 18 MG/3ML SOPN INJECT 0.3 MLS (1.8 MG TOTAL) SUBCUTANEOUSLY ONCE DAILY 9 mL 1   No current facility-administered medications on file prior to visit.   Allergies  Allergen Reactions   Chlorthalidone Other (See Comments)    Causes dehydration, kidneys shut down   Family History  Problem Relation Age of Onset   Heart attack Maternal  Aunt    Diabetes Maternal Grandmother    Hypertension Maternal Grandmother     PE: BP (!) 154/90   Pulse 70   Ht '5\' 9"'  (1.753 m)   Wt (!) 302 lb (137 kg)   SpO2 96%   BMI 44.60 kg/m  Wt Readings from Last 3 Encounters:  06/17/21 (!) 302 lb (137 kg)  05/05/21 (!) 304 lb (137.9 kg)  03/18/21 (!) 311 lb (141.1 kg)   Constitutional: overweight, in NAD Eyes: PERRLA, EOMI, no exophthalmos ENT: moist mucous membranes, no thyromegaly, no cervical lymphadenopathy Cardiovascular: RRR, No MRG, + LE edema Respiratory: CTA B Gastrointestinal: abdomen soft, NT, ND, BS+ Musculoskeletal: + Deformities: Right transmetatarsal amputation, strength intact in all 4 Skin: moist, warm, no rashes Neurological: no tremor with outstretched hands, DTR normal in all 4  ASSESSMENT: 1. DM2, insulin-dependent, uncontrolled, with complications - CAD - s/p AMI 2012, s/p multiple PCI's; ischemic cardiomyopathy; CHF. Dr. Claiborne Billings - CKD stage 4 - DR - PN - s/p R transmetatarsal amputation - Gastroparesis - History of DKA  PLAN:  1. Patient with longstanding, uncontrolled, type 2 diabetes, on basal-bolus insulin regimen and daily GLP-1 receptor agonist with improved control in the last year, from 13.8% to 7.5% after he started to improve his diet.  However, at last visit, HbA1c was higher, at 8.3%.  At last visit, we increased his Basaglar dose.  Sugars were quite high, with only few exceptions in the 120s to 140s.  Main problems were: erratic meals and significant weight gain.  He did see nutrition afterwards. -We tried to switch from Victoza to [place] but he could not start this due to price.  At this visit, he tells me that he changed his insurance to [employer].  When verified, it appears that Victoza is the same tier with Ozempic.  It would be wonderful if she could start on 1 mg once weekly Ozempic, instead.  I advised him to try to do so and send the prescription to his pharmacy.  I also gave  him a Plan B, to increase his Humalog doses and take them before every single meal, if he could not get Ozempic.  I do not feel we have to change his Basaglar dose for now.  It is encouraging that his sugars started to improve in the last week and I strongly advised him to continue to make changes in his diet and activity. - I suggested to:  Patient Instructions  Please continue: - Basaglar 30 units a day 2x a day - Humalog 10-12 15 minutes before meals - try to take this before every meals - Victoza 1.8 mg daily before breakfast.  Try to change from Victoza to: - Ozempic 1 mg weekly  If you cannot start Ozempic, increase: - Humalog to 12-18 units before meals  Please come back for a follow-up appointment in 3-4 months.  - we checked his HbA1c: 9.1% (higher) - advised to check sugars at different times of the day - 4x a day, rotating check times - advised for yearly eye  exams >> he is UTD - return to clinic in 3-4 months  2. HL -Reviewed latest lipid panel from 08/2020: Fractions at goal -She continues on Crestor 40 mg daily and Zetia 10 mg daily without side effects.  3.  Obesity class III -He usually has large fluctuations in his weight, possibly also due to fluid status -Before last visit, he gained approximately 40 pounds! -At that time, we discussed about improving meal schedule and I referred him to nutrition.  He was able to see the nutritionist.  I reviewed Mickel Baas Jobe's note.  Barriers to improved nutrition: Increased beer intake, eating takeout frequently, sleeping only 5 hours a night. -Unfortunately, he was not able to start Ozempic due to price.  He continues on Victoza, but at this visit, after his change in insurance, I sent another prescription for Ozempic to his pharmacy and I hope that this is covered for him.  This will greatly help to improve his cravings and weight.  We also discussed about positive influences on the cardiovascular and kidney risks. -He is on  gabapentin for peripheral neuropathy and this is weight inducing. -At last visit, weight was 318 lbs >> today: 302 lbs (lost 16 lbs)  Philemon Kingdom, MD PhD [facility]

## 2021-06-17 NOTE — Patient Instructions (Addendum)
Please continue: - Basaglar 30 units a day 2x a day - Humalog 10-12 15 minutes before meals - try to take this before every meals - Victoza 1.8 mg daily before breakfast.  Try to change from Victoza to: - Ozempic 1 mg weekly  If you cannot start Ozempic, increase: - Humalog to 12-18 units before meals  Please come back for a follow-up appointment in 3-4 months.

## 2021-06-19 ENCOUNTER — Ambulatory Visit: Payer: Medicare Other | Attending: Physician Assistant | Admitting: Rehabilitative and Restorative Service Providers"

## 2021-06-19 ENCOUNTER — Other Ambulatory Visit: Payer: Self-pay

## 2021-06-19 ENCOUNTER — Encounter: Payer: Self-pay | Admitting: Rehabilitative and Restorative Service Providers"

## 2021-06-19 DIAGNOSIS — R262 Difficulty in walking, not elsewhere classified: Secondary | ICD-10-CM | POA: Insufficient documentation

## 2021-06-19 DIAGNOSIS — M545 Low back pain, unspecified: Secondary | ICD-10-CM | POA: Diagnosis not present

## 2021-06-19 DIAGNOSIS — M6281 Muscle weakness (generalized): Secondary | ICD-10-CM | POA: Diagnosis present

## 2021-06-19 DIAGNOSIS — G8929 Other chronic pain: Secondary | ICD-10-CM | POA: Diagnosis present

## 2021-06-19 DIAGNOSIS — R600 Localized edema: Secondary | ICD-10-CM | POA: Insufficient documentation

## 2021-06-19 DIAGNOSIS — R296 Repeated falls: Secondary | ICD-10-CM | POA: Diagnosis present

## 2021-06-19 NOTE — Therapy (Signed)
[place]. [place], [place], [address] Phone: [phone]   Fax:  [phone]  Physical Therapy Treatment  Patient Details  Name: Joshua [name] MRN: [id number] Date of Birth: 12-Jun-1956 Referring Provider (PT): Benjiman Core, [place]   Encounter Date: 06/19/2021   PT End of Session - 06/19/21 0845     Visit Number 13    Date for PT Re-Evaluation 07/24/21    PT Start Time 0800    PT Stop Time 0843    PT Time Calculation (min) 43 min    Activity Tolerance Patient tolerated treatment well    Behavior During Therapy [facility] for tasks assessed/performed             Past Medical History:  Diagnosis Date   Anemia    Chronic combined systolic and diastolic CHF (congestive heart failure) ([place])    a. 07/2015 Echo: EF 45-50%, mod LVH, mid apical/antsept AK, Gr 1 DD.   CKD (chronic kidney disease), stage III ([place])    Coronary artery disease    a. 2012 s/p MI/PCI: s/p DES to mid/dist RCA and overlapping DES to LAD;  b. 07/2015 Cath: LAD 10% ISR, D1 40(jailed), LCX 53m, OM2 30, OM3 30, RCA 37m ISR, 10d ISR; c. 07/2016 NSTEMI/PCI: LAD 85ost/p/m (3.5x20 Synergy DES overlapping prior stent), D1 40, LCX 39m, OM2 95 (2.75x16 Synergy DES), OM3 30, RCA 13m, 10d.   Depression    Diabetic gastroparesis ([place])    DKA (diabetic ketoacidoses) 03/14/2018   GERD (gastroesophageal reflux disease)    Gout    Heart murmur    Hyperlipemia    Hypertension    Hypertensive heart disease    Ischemic cardiomyopathy    a. 07/2015 Echo: EF 45-50%, mod LVH, mid-apicalanteroseptal AK, Gr1 DD.   Morbid obesity ([place])    OSA on CPAP    Osteomyelitis ([place])    a. 07/2016 MTP joint of R great toe s/p transmetatarsal amputation.   Polyneuropathy in diabetes(357.2)    Pulmonary sarcoidosis ([place])    "problems w/it years ago; no problems anymore" (07/03/2014)   Rectal bleeding    Sleep apnea    Type II diabetes mellitus ([place])     Past Surgical History:   Procedure Laterality Date   AMPUTATION Right 07/24/2016   Procedure: TRANSMETATARSAL FOOT AMPUTATION;  Surgeon: [name], MD;  Location: [place];  Service: Orthopedics;  Laterality: Right;   BALLOON DILATION N/A 03/21/2018   Procedure: BALLOON DILATION;  Surgeon: [name], MD;  Location: [facility] ENDOSCOPY;  Service: Endoscopy;  Laterality: N/A;   BREAST LUMPECTOMY Right ~ 2000   "benign"   CARDIAC CATHETERIZATION N/A 07/30/2015   Procedure: Left Heart Cath and Coronary Angiography;  Surgeon: Burnell Blanks, MD;  Location: [place] CV LAB;  Service: Cardiovascular;  Laterality: N/A;   CARDIAC CATHETERIZATION N/A 07/19/2016   Procedure: Left Heart Cath and Coronary Angiography;  Surgeon: Belva Crome, MD;  Location: [place] CV LAB;  Service: Cardiovascular;  Laterality: N/A;   CARDIAC CATHETERIZATION N/A 07/29/2016   Procedure: Coronary Stent Intervention;  Surgeon: Belva Crome, MD;  Location: [place] CV LAB;  Service: Cardiovascular;  Laterality: N/A;   CARDIAC CATHETERIZATION N/A 07/29/2016   Procedure: Intravascular Ultrasound/IVUS;  Surgeon: Belva Crome, MD;  Location: [place] CV LAB;  Service: Cardiovascular;  Laterality: N/A;   CATARACT EXTRACTION W/ INTRAOCULAR LENS  IMPLANT, BILATERAL Bilateral 2014-2015   COLONOSCOPY WITH PROPOFOL N/A 08/22/2017   Procedure: COLONOSCOPY  WITH PROPOFOL;  Surgeon: [name], MD;  Location: [place];  Service: Endoscopy;  Laterality: N/A;   CORONARY ANGIOPLASTY WITH STENT PLACEMENT  10/31/2011   30 % MID ATRIOVENTRICULAR GROOVE CX STENOSIS. 90 % MID RCA.PTA TO LAD/STENTING OF TANDEM 60-70%. LAD STENOSIS 99% REDUCED TO 0%.3.0 X 32MM PROMUS DES POSTDILATED TO 3.25MM.   CORONARY ANGIOPLASTY WITH STENT PLACEMENT  07/03/2014   "2"   CORONARY STENT INTERVENTION N/A 04/09/2019   Procedure: CORONARY STENT INTERVENTION;  Surgeon: Jettie Booze, MD;  Location: [place] CV LAB;  Service: Cardiovascular;  Laterality: N/A;    CORONARY STENT INTERVENTION N/A 08/22/2019   Procedure: CORONARY STENT INTERVENTION;  Surgeon: Martinique, Peter M, MD;  Location: [place] CV LAB;  Service: Cardiovascular;  Laterality: N/A;   ESOPHAGOGASTRODUODENOSCOPY (EGD) WITH PROPOFOL N/A 03/21/2018   Procedure: ESOPHAGOGASTRODUODENOSCOPY (EGD) WITH PROPOFOL;  Surgeon: [name], MD;  Location: [place];  Service: Endoscopy;  Laterality: N/A;   I & D EXTREMITY Right 10/30/2013   Procedure: IRRIGATION AND DEBRIDEMENT RIGHT SMALL FINGER POSSIBLE SECONDARY WOUND CLOSURE;  Surgeon: [name], MD;  [facility];  Service: Orthopedics;  Laterality: Right;  [name] is available after 4pm   I & D EXTREMITY Right 11/01/2013   Procedure:  IRRIGATION AND DEBRIDEMENT RIGHT  PROXIMAL PHALANGEAL LEVEL AMPUTATION;  Surgeon: [name], MD;  Location[facility];  Service: Orthopedics;  Laterality: Right;   INCISION AND DRAINAGE Right 10/28/2013   Procedure: INCISION AND DRAINAGE Right small finger;  Surgeon: [name], MD;  [facility];  Service: Orthopedics;  Laterality: Right;   LEFT HEART CATH Left 11/03/2011   Procedure: LEFT HEART CATH;  Surgeon: [name], MD;  Location: [facility] CATH LAB;  Service: Cardiovascular;  Laterality: Left;   LEFT HEART CATH AND CORONARY ANGIOGRAPHY N/A 04/09/2019   Procedure: LEFT HEART CATH AND CORONARY ANGIOGRAPHY;  Surgeon: Jettie Booze, MD;  Location: [place] CV LAB;  Service: Cardiovascular;  Laterality: N/A;   LEFT HEART CATHETERIZATION WITH CORONARY ANGIOGRAM N/A 07/03/2014   Procedure: LEFT HEART CATHETERIZATION WITH CORONARY ANGIOGRAM;  Surgeon: [name], MD;  Location: [facility] CATH LAB;  Service: Cardiovascular;  Laterality: N/A;   PERCUTANEOUS CORONARY STENT INTERVENTION (PCI-S) Left 11/03/2011   Procedure: PERCUTANEOUS CORONARY STENT INTERVENTION (PCI-S);  Surgeon: [name], MD;  Location: [facility] CATH LAB;  Service: Cardiovascular;  Laterality: Left;    RIGHT/LEFT HEART CATH AND CORONARY ANGIOGRAPHY N/A 08/22/2019   Procedure: RIGHT/LEFT HEART CATH AND CORONARY ANGIOGRAPHY;  Surgeon: Martinique, Peter M, MD;  Location: [place] CV LAB;  Service: Cardiovascular;  Laterality: N/A;   TOE AMPUTATION Right ~ 2010   "2nd toe"   TOE AMPUTATION Right 2012   "3rd toe"    There were no vitals filed for this visit.   Subjective Assessment - 06/19/21 0801     Subjective My back is better, but sometimes on the off days it aches like crazy    Patient Stated Goals I would love to be able to walk and stand and sit up straight for a while without my back bothering me.    Currently in Pain? Yes    Pain Score 3     Pain Location Back                               [facility] Adult PT Treatment/Exercise - 06/19/21 0001       Ambulation/Gait  Ambulation/Gait Yes    Ambulation/Gait Assistance 7: Independent    Ambulation Distance (Feet) 150 Feet    Gait velocity decreased      Lumbar Exercises: Aerobic   UBE (Upper Arm Bike) L2 3 min each way    Nustep L5 x40min      Lumbar Exercises: Machines for Strengthening   Leg Press 40lb 3x12      Lumbar Exercises: Standing   Shoulder Extension Strengthening;Power Tower;Both;20 reps    Shoulder Extension Limitations 15#      Lumbar Exercises: Seated   Sit to Stand 10 reps   on airex with holding yellow wt ball     Manual Therapy   Manual Therapy Soft tissue mobilization    Soft tissue mobilization lumbar area              Trigger Point Dry Needling - 06/19/21 0001     Consent Given? Yes    Education Handout Provided Yes    Muscles Treated Back/Hip Erector spinae    Erector spinae Response Twitch response elicited;Palpable increased muscle length                  PT Education - 06/19/21 0844     Education Details Pt educated about DN    Person(s) Educated Patient    Methods Explanation;Handout    Comprehension Verbalized understanding              PT  Short Term Goals - 05/14/21 0837       PT SHORT TERM GOAL #1   Title Patient will be independent with initial HEP.    Status Achieved               PT Long Term Goals - 06/19/21 0849       PT LONG TERM GOAL #1   Title Patient will be independent with advanced HEP.    Status On-going      PT LONG TERM GOAL #3   Title Patient will increase BERG to at least 48/56 to place him at a low risk of falling    Status On-going      PT LONG TERM GOAL #4   Title Patient will increase functional LE and lumbar strength to at least 5/5 to allow him to more easily perform transfers and ambulation.    Status On-going                   Plan - 06/19/21 0846     Clinical Impression Statement Pt reporting that he continues to experience tightness in his back.  Educated pt about DN and he wished to try DN.  Following, ambulated outside x150 ft, including curbs, and pt states that he felt better and that his back was not as tight.  He continues to require min cuing for maintaining core during some exercises.  He continues to require skilled PT to progress towards goal related activiites.    PT Treatment/Interventions ADLs/Self Care Home Management;Cryotherapy;Electrical Stimulation;Iontophoresis 4mg /ml Dexamethasone;Moist Heat;Traction;Ultrasound;Gait training;Stair training;Therapeutic activities;Therapeutic exercise;Balance training;Neuromuscular re-education;Dry needling;Passive range of motion;Spinal Manipulations;Joint Manipulations    PT Next Visit Plan assess DN benefits, core strength and flexibilty    Consulted and Agree with Plan of Care Patient             Patient will benefit from skilled therapeutic intervention in order to improve the following deficits and impairments:  Difficulty walking, Decreased endurance, Increased muscle spasms, Obesity, Decreased activity tolerance, Pain, Decreased balance, Impaired flexibility, Decreased strength,  Postural dysfunction  Visit  Diagnosis: Chronic bilateral low back pain without sciatica  Muscle weakness (generalized)  Difficulty in walking, not elsewhere classified  Repeated falls     Problem List Patient Active Problem List   Diagnosis Date Noted   Chronic low back pain    Hypokalemia 09/25/2020   Type 2 diabetes mellitus with stage 3 chronic kidney disease ([place]) 09/24/2020   Unstable angina ([place]) 08/17/2019   Chest pain 08/16/2019   NSTEMI (non-ST elevated myocardial infarction) ([place]) 04/06/2019   ACS (acute coronary syndrome) ([place]) 04/06/2019   Sepsis ([place]) 03/15/2018   DKA (diabetic ketoacidoses) 03/15/2018   Special screening for malignant neoplasms, colon 08/22/2017   Acute renal failure superimposed on stage 3 chronic kidney disease ([place]) 05/19/2017   Essential hypertension    Status post transmetatarsal amputation of foot, right ([place]) 11/08/2016   Diabetic polyneuropathy associated with type 2 diabetes mellitus ([place]) 11/08/2016   Pure hypercholesterolemia    AKI (acute kidney injury) ([place])    Acute blood loss anemia    DM type 2 with diabetic peripheral neuropathy ([place])    Physical debility 08/03/2016   Chronic osteomyelitis of right foot ([place])    Cellulitis and abscess of leg, except foot    Penetrating wound of right foot    CKD (chronic kidney disease) stage 3, GFR 30-59 ml/min (HCC)    Unstable angina pectoris ([place]) 07/18/2016   Chronic combined systolic and diastolic congestive heart failure ([place])    Chest pain with moderate risk for cardiac etiology 06/24/2014   CAD S/P multiple PCIs 06/24/2014   Sleep apnea- on C-pap 06/24/2014   Morbid obesity due to excess calories ([place]) 11/10/2013   Hyperglycemia 10/29/2013   Hyperlipidemia 01/12/2011   Benign essential HTN 01/12/2011   GERD 01/12/2011    Juel Burrow, PT, DPT 06/19/2021, 8:52 AM  [place]. [place], [place], [address] Phone: [phone]   Fax:   [phone]  Name: DARREK LEASURE MRN: [id number] Date of Birth: 06-Sep-1956

## 2021-06-19 NOTE — Patient Instructions (Signed)
Trigger Point Dry Needling  ? What is Trigger Point Dry Needling (DN)?  o DN is a physical therapy technique used to treat muscle pain and dysfunction.  Specifically, DN helps deactivate muscle trigger points (muscle knots).  o A thin filiform needle is used to penetrate the skin and stimulate the underlying  trigger point. The goal is for a local twitch response (LTR) to occur and for the  trigger point to relax. No medication of any kind is injected during the procedure.  ? What Does Trigger Point Dry Needling Feel Like?  o The procedure feels different for each individual patient. Some patients report  that they do not actually feel the needle enter the skin and overall the process is  not painful. Very mild bleeding may occur. However, many patients feel a deep  cramping in the muscle in which the needle was inserted. This is the local twitch  response.  ? How Will I feel after the treatment?  o Soreness is normal, and the onset of soreness may not occur for a few hours.  Typically this soreness does not last longer than two days.  o Bruising is uncommon, however; ice can be used to decrease any possible  bruising.  o In rare cases feeling tired or nauseous after the treatment is normal. In addition,  your symptoms may get worse before they get better, this period will typically not  last longer than 24 hours.  ? What Can I do After My Treatment?  o Increase your hydration by drinking more water for the next 24 hours.  o You may place ice or heat on the areas treated that have become sore, however,  do not use heat on inflamed or bruised areas. Heat often brings more relief post  needling.  o You can continue your regular activities, but vigorous activity is not  recommended initially after the treatment for 24 hours.  DN is best combined with other physical therapy such as strengthening, stretching, and other  therapies. 

## 2021-06-23 ENCOUNTER — Ambulatory Visit: Payer: Medicare Other | Admitting: Physical Therapy

## 2021-06-23 ENCOUNTER — Encounter: Payer: Self-pay | Admitting: Physical Therapy

## 2021-06-23 ENCOUNTER — Other Ambulatory Visit: Payer: Self-pay

## 2021-06-23 DIAGNOSIS — R262 Difficulty in walking, not elsewhere classified: Secondary | ICD-10-CM

## 2021-06-23 DIAGNOSIS — G8929 Other chronic pain: Secondary | ICD-10-CM

## 2021-06-23 DIAGNOSIS — M545 Low back pain, unspecified: Secondary | ICD-10-CM | POA: Diagnosis not present

## 2021-06-23 DIAGNOSIS — M6281 Muscle weakness (generalized): Secondary | ICD-10-CM

## 2021-06-23 NOTE — Therapy (Signed)
[place]. [place], [place], [address] Phone: [phone]   Fax:  [phone]  Physical Therapy Treatment  Patient Details  Name: Joshua [name] MRN: [id number] Date of Birth: [birth year] Referring Provider (PT): Benjiman Core, [place]   Encounter Date: 06/23/2021   PT End of Session - 06/23/21 1230     Visit Number 14    Date for PT Re-Evaluation 07/24/21    PT Start Time 1145    PT Stop Time 1027    PT Time Calculation (min) 1362 min    Activity Tolerance Patient tolerated treatment well    Behavior During Therapy [facility] for tasks assessed/performed             Past Medical History:  Diagnosis Date   Anemia    Chronic combined systolic and diastolic CHF (congestive heart failure) ([place])    a. 07/2015 Echo: EF 45-50%, mod LVH, mid apical/antsept AK, Gr 1 DD.   CKD (chronic kidney disease), stage III ([place])    Coronary artery disease    a. 2012 s/p MI/PCI: s/p DES to mid/dist RCA and overlapping DES to LAD;  b. 07/2015 Cath: LAD 10% ISR, D1 40(jailed), LCX 65m, OM2 30, OM3 30, RCA 83m ISR, 10d ISR; c. 07/2016 NSTEMI/PCI: LAD 85ost/p/m (3.5x20 Synergy DES overlapping prior stent), D1 40, LCX 7m, OM2 95 (2.75x16 Synergy DES), OM3 30, RCA 31m, 10d.   Depression    Diabetic gastroparesis ([place])    DKA (diabetic ketoacidoses) 03/14/2018   GERD (gastroesophageal reflux disease)    Gout    Heart murmur    Hyperlipemia    Hypertension    Hypertensive heart disease    Ischemic cardiomyopathy    a. 07/2015 Echo: EF 45-50%, mod LVH, mid-apicalanteroseptal AK, Gr1 DD.   Morbid obesity ([place])    OSA on CPAP    Osteomyelitis ([place])    a. 07/2016 MTP joint of R great toe s/p transmetatarsal amputation.   Polyneuropathy in diabetes(357.2)    Pulmonary sarcoidosis ([place])    "problems w/it years ago; no problems anymore" (07/03/2014)   Rectal bleeding    Sleep apnea    Type II diabetes mellitus ([place])     Past Surgical History:   Procedure Laterality Date   AMPUTATION Right 07/24/2016   Procedure: TRANSMETATARSAL FOOT AMPUTATION;  Surgeon: [name], MD;  Location: [place];  Service: Orthopedics;  Laterality: Right;   BALLOON DILATION N/A 03/21/2018   Procedure: BALLOON DILATION;  Surgeon:  Lobo, MD;  Location: [facility] ENDOSCOPY;  Service: Endoscopy;  Laterality: N/A;   BREAST LUMPECTOMY Right ~ 2000   "benign"   CARDIAC CATHETERIZATION N/A 07/30/2015   Procedure: Left Heart Cath and Coronary Angiography;  Surgeon: Burnell Blanks, MD;  Location: [place] CV LAB;  Service: Cardiovascular;  Laterality: N/A;   CARDIAC CATHETERIZATION N/A 07/19/2016   Procedure: Left Heart Cath and Coronary Angiography;  Surgeon: Belva Crome, MD;  Location: [place] [facility];  Service: Cardiovascular;  Laterality: N/A;   CARDIAC CATHETERIZATION N/A 07/29/2016   Procedure: Coronary Stent Intervention;  Surgeon: Belva Crome, MD;  Location: [place] CV LAB;  Service: Cardiovascular;  Laterality: N/A;   CARDIAC CATHETERIZATION N/A 07/29/2016   Procedure: Intravascular Ultrasound/IVUS;  Surgeon: Belva Crome, MD;  Location: [place] CV LAB;  Service: Cardiovascular;  Laterality: N/A;   CATARACT EXTRACTION W/ INTRAOCULAR LENS  IMPLANT, BILATERAL Bilateral 2014-2015   COLONOSCOPY WITH PROPOFOL N/A 08/22/2017   Procedure: COLONOSCOPY  WITH PROPOFOL;  Surgeon: [name], MD;  Location: [place];  Service: Endoscopy;  Laterality: N/A;   CORONARY ANGIOPLASTY WITH STENT PLACEMENT  10/31/2011   30 % MID ATRIOVENTRICULAR GROOVE CX STENOSIS. 90 % MID RCA.PTA TO LAD/STENTING OF TANDEM 60-70%. LAD STENOSIS 99% REDUCED TO 0%.3.0 X 32MM PROMUS DES POSTDILATED TO 3.25MM.   CORONARY ANGIOPLASTY WITH STENT PLACEMENT  07/03/2014   "2"   CORONARY STENT INTERVENTION N/A 04/09/2019   Procedure: CORONARY STENT INTERVENTION;  Surgeon: Jettie Booze, MD;  Location: [facility];  Service: Cardiovascular;  Laterality: N/A;    CORONARY STENT INTERVENTION N/A 08/22/2019   Procedure: CORONARY STENT INTERVENTION;  Surgeon: Martinique, Peter M, MD;  Location: [place] CV LAB;  Service: Cardiovascular;  Laterality: N/A;   ESOPHAGOGASTRODUODENOSCOPY (EGD) WITH PROPOFOL N/A 03/21/2018   Procedure: ESOPHAGOGASTRODUODENOSCOPY (EGD) WITH PROPOFOL;  Surgeon:  Lobo, MD;  Location: [place];  Service: Endoscopy;  Laterality: N/A;   I & D EXTREMITY Right 10/30/2013   Procedure: IRRIGATION AND DEBRIDEMENT RIGHT SMALL FINGER POSSIBLE SECONDARY WOUND CLOSURE;  Surgeon: [name], MD;  [facility];  Service: Orthopedics;  Laterality: Right;  [name] is available after 4pm   I & D EXTREMITY Right 11/01/2013   Procedure:  IRRIGATION AND DEBRIDEMENT RIGHT  PROXIMAL PHALANGEAL LEVEL AMPUTATION;  Surgeon: [name], MD;  [facility];  Service: Orthopedics;  Laterality: Right;   INCISION AND DRAINAGE Right 10/28/2013   Procedure: INCISION AND DRAINAGE Right small finger;  Surgeon: [name], MD;  [facility];  Service: Orthopedics;  Laterality: Right;   LEFT HEART CATH Left 11/03/2011   Procedure: LEFT HEART CATH;  Surgeon: [name], MD;  Location: [facility] CATH LAB;  Service: Cardiovascular;  Laterality: Left;   LEFT HEART CATH AND CORONARY ANGIOGRAPHY N/A 04/09/2019   Procedure: LEFT HEART CATH AND CORONARY ANGIOGRAPHY;  Surgeon: Jettie Booze, MD;  Location: [place] [facility] LAB;  Service: Cardiovascular;  Laterality: N/A;   LEFT HEART CATHETERIZATION WITH CORONARY ANGIOGRAM N/A 07/03/2014   Procedure: LEFT HEART CATHETERIZATION WITH CORONARY ANGIOGRAM;  Surgeon: [name], MD;  Location: [facility] CATH LAB;  Service: Cardiovascular;  Laterality: N/A;   PERCUTANEOUS CORONARY STENT INTERVENTION (PCI-S) Left 11/03/2011   Procedure: PERCUTANEOUS CORONARY STENT INTERVENTION (PCI-S);  Surgeon: [name], MD;  Location: [facility] CATH LAB;  Service: Cardiovascular;  Laterality: Left;    RIGHT/LEFT HEART CATH AND CORONARY ANGIOGRAPHY N/A 08/22/2019   Procedure: RIGHT/LEFT HEART CATH AND CORONARY ANGIOGRAPHY;  Surgeon: Martinique, Peter M, MD;  Location: [place] CV LAB;  Service: Cardiovascular;  Laterality: N/A;   TOE AMPUTATION Right ~ 2010   "2nd toe"   TOE AMPUTATION Right 2012   "3rd toe"    There were no vitals filed for this visit.   Subjective Assessment - 06/23/21 1146     Subjective A little tight today    Currently in Pain? Yes    Pain Score 6     Pain Location Back    Pain Orientation Right;Lower                               [facility] Adult PT Treatment/Exercise - 06/23/21 0001       Lumbar Exercises: Aerobic   Nustep L6 x47min      Lumbar Exercises: Machines for Strengthening   Leg Press 50lb 3x12    Other Lumbar Machine Exercise Rows &  Lats 4t5lb 2x10.      Lumbar Exercises: Standing   Shoulder Extension Strengthening;Power Tower;Both;20 reps    Shoulder Extension Limitations 15    Other Standing Lumbar Exercises 5lb hip Ext 2x10    Other Standing Lumbar Exercises AR press 20lb x10 each                      PT Short Term Goals - 05/14/21 0837       PT SHORT TERM GOAL #1   Title Patient will be independent with initial HEP.    Status Achieved               PT Long Term Goals - 06/19/21 0849       PT LONG TERM GOAL #1   Title Patient will be independent with advanced HEP.    Status On-going      PT LONG TERM GOAL #3   Title Patient will increase BERG to at least 48/56 to place him at a low risk of falling    Status On-going      PT LONG TERM GOAL #4   Title Patient will increase functional LE and lumbar strength to at least 5/5 to allow him to more easily perform transfers and ambulation.    Status On-going                   Plan - 06/23/21 1230     Clinical Impression Statement Low back tightness remains. Added some resistance with leg press, seater rows & lats without issue. Some  weakness noted with anti rotational presses. Hip weakness and SLS instability noted with hip extensions. Postural cues required with shoulder extensions.    Personal Factors and Comorbidities Age;Fitness    Examination-Activity Limitations Locomotion Level;Bend;Squat;Stairs;Stand    Statistician;Shop;Yard Work    Stability/Clinical Decision Making Evolving/Moderate complexity    Rehab Potential Good    PT Frequency 2x / week    PT Duration 12 weeks    PT Next Visit Plan assess DN benefits, core strength and flexibility             Patient will benefit from skilled therapeutic intervention in order to improve the following deficits and impairments:  Difficulty walking, Decreased endurance, Increased muscle spasms, Obesity, Decreased activity tolerance, Pain, Decreased balance, Impaired flexibility, Decreased strength, Postural dysfunction  Visit Diagnosis: Difficulty in walking, not elsewhere classified  Chronic bilateral low back pain without sciatica  Muscle weakness (generalized)     Problem List Patient Active Problem List   Diagnosis Date Noted   Chronic low back pain    Hypokalemia 09/25/2020   Type 2 diabetes mellitus with stage 3 chronic kidney disease ([place]) 09/24/2020   Unstable angina ([place]) 08/17/2019   Chest pain 08/16/2019   NSTEMI (non-ST elevated myocardial infarction) ([place]) 04/06/2019   ACS (acute coronary syndrome) ([place]) 04/06/2019   Sepsis ([place]) 03/15/2018   DKA (diabetic ketoacidoses) 03/15/2018   Special screening for malignant neoplasms, colon 08/22/2017   Acute renal failure superimposed on stage 3 chronic kidney disease ([place]) 05/19/2017   Essential hypertension    Status post transmetatarsal amputation of foot, right ([place]) 11/08/2016   Diabetic polyneuropathy associated with type 2 diabetes mellitus ([place]) 11/08/2016   Pure hypercholesterolemia    AKI (acute kidney injury) ([place])    Acute blood loss anemia     DM type 2 with diabetic peripheral neuropathy ([place])    Physical debility 08/03/2016   Chronic osteomyelitis of  right foot ([place])    Cellulitis and abscess of leg, except foot    Penetrating wound of right foot    CKD (chronic kidney disease) stage 3, GFR 30-59 ml/min (HCC)    Unstable angina pectoris ([place]) 07/18/2016   Chronic combined systolic and diastolic congestive heart failure (HCC)    Chest pain with moderate risk for cardiac etiology 06/24/2014   CAD S/P multiple PCIs 06/24/2014   Sleep apnea- on C-pap 06/24/2014   Morbid obesity due to excess calories ([place]) 11/10/2013   Hyperglycemia 10/29/2013   Hyperlipidemia 01/12/2011   Benign essential HTN 01/12/2011   GERD 01/12/2011    Scot Jun 06/23/2021, 12:34 PM  [place]. [place], [place], [address] Phone: [phone]   Fax:  [phone]  Name: CRAVEN CREAN MRN: [id number] Date of Birth: [birth year]

## 2021-06-26 ENCOUNTER — Other Ambulatory Visit: Payer: Self-pay

## 2021-06-26 ENCOUNTER — Ambulatory Visit: Payer: Medicare Other | Admitting: Physical Therapy

## 2021-06-26 ENCOUNTER — Encounter: Payer: Self-pay | Admitting: Physical Therapy

## 2021-06-26 DIAGNOSIS — R262 Difficulty in walking, not elsewhere classified: Secondary | ICD-10-CM

## 2021-06-26 DIAGNOSIS — M545 Low back pain, unspecified: Secondary | ICD-10-CM

## 2021-06-26 DIAGNOSIS — R296 Repeated falls: Secondary | ICD-10-CM

## 2021-06-26 DIAGNOSIS — M6281 Muscle weakness (generalized): Secondary | ICD-10-CM

## 2021-06-26 DIAGNOSIS — G8929 Other chronic pain: Secondary | ICD-10-CM

## 2021-06-26 NOTE — Therapy (Signed)
[address]. [place], [place], [address] Phone: [phone]   Fax:  [phone]  Physical Therapy Treatment  Patient Details  Name: Joshua [name] MRN: [id number] Date of Birth: 1956-10-08 Referring Provider (PT): Benjiman Core, [place]   Encounter Date: 06/26/2021   PT End of Session - 06/26/21 1147     Visit Number 15    Date for PT Re-Evaluation 07/24/21    PT Start Time 1100    PT Stop Time 1144    PT Time Calculation (min) 44 min    Activity Tolerance Patient tolerated treatment well    Behavior During Therapy [name] for tasks assessed/performed             Past Medical History:  Diagnosis Date   Anemia    Chronic combined systolic and diastolic CHF (congestive heart failure) ([place])    a. 07/2015 Echo: EF 45-50%, mod LVH, mid apical/antsept AK, Gr 1 DD.   CKD (chronic kidney disease), stage III ([place])    Coronary artery disease    a. 2012 s/p MI/PCI: s/p DES to mid/dist RCA and overlapping DES to LAD;  b. 07/2015 Cath: LAD 10% ISR, D1 40(jailed), LCX 54m, OM2 30, OM3 30, RCA 61m ISR, 10d ISR; c. 07/2016 NSTEMI/PCI: LAD 85ost/p/m (3.5x20 Synergy DES overlapping prior stent), D1 40, LCX 26m, OM2 95 (2.75x16 Synergy DES), OM3 30, RCA 84m, 10d.   Depression    Diabetic gastroparesis ([place])    DKA (diabetic ketoacidoses) 03/14/2018   GERD (gastroesophageal reflux disease)    Gout    Heart murmur    Hyperlipemia    Hypertension    Hypertensive heart disease    Ischemic cardiomyopathy    a. 07/2015 Echo: EF 45-50%, mod LVH, mid-apicalanteroseptal AK, Gr1 DD.   Morbid obesity ([place])    OSA on CPAP    Osteomyelitis ([place])    a. 07/2016 MTP joint of R great toe s/p transmetatarsal amputation.   Polyneuropathy in diabetes(357.2)    Pulmonary sarcoidosis ([place])    "problems w/it years ago; no problems anymore" (07/03/2014)   Rectal bleeding    Sleep apnea    Type II diabetes mellitus ([place])     Past Surgical History:   Procedure Laterality Date   AMPUTATION Right 07/24/2016   Procedure: TRANSMETATARSAL FOOT AMPUTATION;  Surgeon: [name], MD;  Location: [place];  Service: Orthopedics;  Laterality: Right;   BALLOON DILATION N/A 03/21/2018   Procedure: BALLOON DILATION;  Surgeon:  Lobo, MD;  Location: [facility] ENDOSCOPY;  Service: Endoscopy;  Laterality: N/A;   BREAST LUMPECTOMY Right ~ 2000   "benign"   CARDIAC CATHETERIZATION N/A 07/30/2015   Procedure: Left Heart Cath and Coronary Angiography;  Surgeon: Burnell Blanks, MD;  Location: [place] CV LAB;  Service: Cardiovascular;  Laterality: N/A;   CARDIAC CATHETERIZATION N/A 07/19/2016   Procedure: Left Heart Cath and Coronary Angiography;  Surgeon: Belva Crome, MD;  Location: [place] CV LAB;  Service: Cardiovascular;  Laterality: N/A;   CARDIAC CATHETERIZATION N/A 07/29/2016   Procedure: Coronary Stent Intervention;  Surgeon: Belva Crome, MD;  Location: [place] CV LAB;  Service: Cardiovascular;  Laterality: N/A;   CARDIAC CATHETERIZATION N/A 07/29/2016   Procedure: Intravascular Ultrasound/IVUS;  Surgeon: Belva Crome, MD;  Location: [place] CV LAB;  Service: Cardiovascular;  Laterality: N/A;   CATARACT EXTRACTION W/ INTRAOCULAR LENS  IMPLANT, BILATERAL Bilateral 2014-2015   COLONOSCOPY WITH PROPOFOL N/A 08/22/2017   Procedure: COLONOSCOPY  WITH PROPOFOL;  Surgeon: [name], MD;  Location: [place];  Service: Endoscopy;  Laterality: N/A;   CORONARY ANGIOPLASTY WITH STENT PLACEMENT  10/31/2011   30 % MID ATRIOVENTRICULAR GROOVE CX STENOSIS. 90 % MID RCA.PTA TO LAD/STENTING OF TANDEM 60-70%. LAD STENOSIS 99% REDUCED TO 0%.3.0 X 32MM PROMUS DES POSTDILATED TO 3.25MM.   CORONARY ANGIOPLASTY WITH STENT PLACEMENT  07/03/2014   "2"   CORONARY STENT INTERVENTION N/A 04/09/2019   Procedure: CORONARY STENT INTERVENTION;  Surgeon: Jettie Booze, MD;  Location: [place] CV LAB;  Service: Cardiovascular;  Laterality: N/A;    CORONARY STENT INTERVENTION N/A 08/22/2019   Procedure: CORONARY STENT INTERVENTION;  Surgeon: Martinique, Peter M, MD;  Location: [place] CV LAB;  Service: Cardiovascular;  Laterality: N/A;   ESOPHAGOGASTRODUODENOSCOPY (EGD) WITH PROPOFOL N/A 03/21/2018   Procedure: ESOPHAGOGASTRODUODENOSCOPY (EGD) WITH PROPOFOL;  Surgeon:  Lobo, MD;  Location: [place];  Service: Endoscopy;  Laterality: N/A;   I & D EXTREMITY Right 10/30/2013   Procedure: IRRIGATION AND DEBRIDEMENT RIGHT SMALL FINGER POSSIBLE SECONDARY WOUND CLOSURE;  Surgeon: [name], MD;  [facility];  Service: Orthopedics;  Laterality: Right;  [name] is available after 4pm   I & D EXTREMITY Right 11/01/2013   Procedure:  IRRIGATION AND DEBRIDEMENT RIGHT  PROXIMAL PHALANGEAL LEVEL AMPUTATION;  Surgeon: [name], MD;  Location: [facility];  Service: Orthopedics;  Laterality: Right;   INCISION AND DRAINAGE Right 10/28/2013   Procedure: INCISION AND DRAINAGE Right small finger;  Surgeon: [name], MD;  [facility];  Service: Orthopedics;  Laterality: Right;   LEFT HEART CATH Left 11/03/2011   Procedure: LEFT HEART CATH;  Surgeon: [name], MD;  Location[facility] CATH LAB;  Service: Cardiovascular;  Laterality: Left;   LEFT HEART CATH AND CORONARY ANGIOGRAPHY N/A 04/09/2019   Procedure: LEFT HEART CATH AND CORONARY ANGIOGRAPHY;  Surgeon: Jettie Booze, MD;  Location: [place] CV LAB;  Service: Cardiovascular;  Laterality: N/A;   LEFT HEART CATHETERIZATION WITH CORONARY ANGIOGRAM N/A 07/03/2014   Procedure: LEFT HEART CATHETERIZATION WITH CORONARY ANGIOGRAM;  Surgeon: [name], MD;  Location: [facility] C[facility] LAB;  Service: Cardiovascular;  Laterality: N/A;   PERCUTANEOUS CORONARY STENT INTERVENTION (PCI-S) Left 11/03/2011   Procedure: PERCUTANEOUS CORONARY STENT INTERVENTION (PCI-S);  Surgeon: [name], MD;  Location: [facility] CATH LAB;  Service: Cardiovascular;  Laterality: Left;    RIGHT/LEFT HEART CATH AND CORONARY ANGIOGRAPHY N/A 08/22/2019   Procedure: RIGHT/LEFT HEART CATH AND CORONARY ANGIOGRAPHY;  Surgeon: Martinique, Peter M, MD;  Location: [place] CV LAB;  Service: Cardiovascular;  Laterality: N/A;   TOE AMPUTATION Right ~ 2010   "2nd toe"   TOE AMPUTATION Right 2012   "3rd toe"    There were no vitals filed for this visit.   Subjective Assessment - 06/26/21 1106     Subjective Pt reports a fall two days ago in the grocery store. Pt also reports another fall today entering PT clinic. Pt has all 5 toes amputated on R foot with a sock stuffed down the end of his shoe. Pt reports that he does not pick his feet up high enough at times causing his foot to drag. Pt reports no injuries.    Currently in Pain? No/denies                               [facility] Adult PT Treatment/Exercise - 06/26/21 0001  Lumbar Exercises: Stretches   Passive Hamstring Stretch Right;Left;4 reps;10 seconds    Piriformis Stretch Left;Right;2 reps;10 seconds      Lumbar Exercises: Aerobic   UBE (Upper Arm Bike) L2 3 min each way      Lumbar Exercises: Machines for Strengthening   Cybex Knee Extension 10lb x15    Cybex Knee Flexion 45lb x10, SL 25lb x 10 each    Other Lumbar Machine Exercise Rows & Lats 4t5lb 2x10.      Lumbar Exercises: Standing   Heel Raises 5 reps    Other Standing Lumbar Exercises 5lb standing marches HHA x 1 2x10                      PT Short Term Goals - 05/14/21 0102       PT SHORT TERM GOAL #1   Title Patient will be independent with initial HEP.    Status Achieved               PT Long Term Goals - 06/19/21 0849       PT LONG TERM GOAL #1   Title Patient will be independent with advanced HEP.    Status On-going      PT LONG TERM GOAL #3   Title Patient will increase BERG to at least 48/56 to place him at a low risk of falling    Status On-going      PT LONG TERM GOAL #4   Title Patient will  increase functional LE and lumbar strength to at least 5/5 to allow him to more easily perform transfers and ambulation.    Status On-going                   Plan - 06/26/21 1147     Clinical Impression Statement Pt reports a fall entering clinic and able to pick himself up. Pt ambulates with increase hip flexion due to lack of ankle dorsiflexion. Pt unable to get to zero L ankle DF actively. LOB x 1 today requiring mod to max assist to recover. Pt stated a times the toe of his foot will drag the floor causing him to fall. Added some SL strengthening interventions bilateral weakness noted with both quads and hamstrings. Demonstrated a 4 inch step up to pt but he was unsure if he was able to perform intervention.    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 complexity    Rehab Potential Good    PT Frequency 2x / week    PT Duration 12 weeks    PT Treatment/Interventions ADLs/Self Care Home Management;Cryotherapy;Electrical Stimulation;Iontophoresis 4mg /ml Dexamethasone;Moist Heat;Traction;Ultrasound;Gait training;Stair training;Therapeutic activities;Therapeutic exercise;Balance training;Neuromuscular re-education;Dry needling;Passive range of motion;Spinal Manipulations;Joint Manipulations    PT Next Visit Plan core strength and flexibilty             Patient will benefit from skilled therapeutic intervention in order to improve the following deficits and impairments:  Difficulty walking, Decreased endurance, Increased muscle spasms, Obesity, Decreased activity tolerance, Pain, Decreased balance, Impaired flexibility, Decreased strength, Postural dysfunction  Visit Diagnosis: Chronic bilateral low back pain without sciatica  Muscle weakness (generalized)  Repeated  falls  Difficulty in walking, not elsewhere classified     Problem List Patient Active Problem List   Diagnosis Date Noted   Chronic low back pain    Hypokalemia 09/25/2020   Type 2  diabetes mellitus with stage 3 chronic kidney disease ([place]) 09/24/2020   Unstable angina ([place]) 08/17/2019   Chest pain 08/16/2019   NSTEMI (non-ST elevated myocardial infarction) ([place]) 04/06/2019   ACS (acute coronary syndrome) ([place]) 04/06/2019   Sepsis ([place]) 03/15/2018   DKA (diabetic ketoacidoses) 03/15/2018   Special screening for malignant neoplasms, colon 08/22/2017   Acute renal failure superimposed on stage 3 chronic kidney disease ([place]) 05/19/2017   Essential hypertension    Status post transmetatarsal amputation of foot, right ([place]) 11/08/2016   Diabetic polyneuropathy associated with type 2 diabetes mellitus ([place]) 11/08/2016   Pure hypercholesterolemia    AKI (acute kidney injury) ([place])    Acute blood loss anemia    DM type 2 with diabetic peripheral neuropathy ([place])    Physical debility 08/03/2016   Chronic osteomyelitis of right foot ([place])    Cellulitis and abscess of leg, except foot    Penetrating wound of right foot    CKD (chronic kidney disease) stage 3, GFR 30-59 ml/min (HCC)    Unstable angina pectoris ([place]) 07/18/2016   Chronic combined systolic and diastolic congestive heart failure ([place])    Chest pain with moderate risk for cardiac etiology 06/24/2014   CAD S/P multiple PCIs 06/24/2014   Sleep apnea- on C-pap 06/24/2014   Morbid obesity due to excess calories ([place]) 11/10/2013   Hyperglycemia 10/29/2013   Hyperlipidemia 01/12/2011   Benign essential HTN 01/12/2011   GERD 01/12/2011    Scot Jun 06/26/2021, 11:53 AM  [address]. [place], [place], [address] Phone: [phone]   Fax:  [phone]  Name: Joshua [name] MRN: [id number] Date of Birth: August 14, 1956

## 2021-06-30 ENCOUNTER — Ambulatory Visit: Payer: Medicare Other | Admitting: Physical Therapy

## 2021-06-30 ENCOUNTER — Encounter: Payer: Self-pay | Admitting: Physical Therapy

## 2021-06-30 ENCOUNTER — Other Ambulatory Visit: Payer: Self-pay

## 2021-06-30 DIAGNOSIS — R296 Repeated falls: Secondary | ICD-10-CM

## 2021-06-30 DIAGNOSIS — R262 Difficulty in walking, not elsewhere classified: Secondary | ICD-10-CM

## 2021-06-30 DIAGNOSIS — M6281 Muscle weakness (generalized): Secondary | ICD-10-CM

## 2021-06-30 DIAGNOSIS — R600 Localized edema: Secondary | ICD-10-CM

## 2021-06-30 DIAGNOSIS — M545 Low back pain, unspecified: Secondary | ICD-10-CM

## 2021-06-30 DIAGNOSIS — G8929 Other chronic pain: Secondary | ICD-10-CM

## 2021-06-30 NOTE — Therapy (Signed)
[address]. [place], [place], [address] Phone: [phone]   Fax:  [phone]  Physical Therapy Treatment  Patient Details  Name: Joshua [name] MRN: [id number] Date of Birth: [birth year] Referring Provider (PT): Benjiman Core, [place]   Encounter Date: 06/30/2021   PT End of Session - 06/30/21 1054     Visit Number 16    Date for PT Re-Evaluation 07/24/21    PT Start Time 1015    PT Stop Time 1059    PT Time Calculation (min) 44 min    Activity Tolerance Patient tolerated treatment well    Behavior During Therapy [facility] for tasks assessed/performed             Past Medical History:  Diagnosis Date   Anemia    Chronic combined systolic and diastolic CHF (congestive heart failure) ([place])    a. 07/2015 Echo: EF 45-50%, mod LVH, mid apical/antsept AK, Gr 1 DD.   CKD (chronic kidney disease), stage III ([place])    Coronary artery disease    a. 2012 s/p MI/PCI: s/p DES to mid/dist RCA and overlapping DES to LAD;  b. 07/2015 Cath: LAD 10% ISR, D1 40(jailed), LCX 52m, OM2 30, OM3 30, RCA 58m ISR, 10d ISR; c. 07/2016 NSTEMI/PCI: LAD 85ost/p/m (3.5x20 Synergy DES overlapping prior stent), D1 40, LCX 61m, OM2 95 (2.75x16 Synergy DES), OM3 30, RCA 4m, 10d.   Depression    Diabetic gastroparesis ([place])    DKA (diabetic ketoacidoses) 03/14/2018   GERD (gastroesophageal reflux disease)    Gout    Heart murmur    Hyperlipemia    Hypertension    Hypertensive heart disease    Ischemic cardiomyopathy    a. 07/2015 Echo: EF 45-50%, mod LVH, mid-apicalanteroseptal AK, Gr1 DD.   Morbid obesity ([place])    OSA on CPAP    Osteomyelitis ([place])    a. 07/2016 MTP joint of R great toe s/p transmetatarsal amputation.   Polyneuropathy in diabetes(357.2)    Pulmonary sarcoidosis ([place])    "problems w/it years ago; no problems anymore" (07/03/2014)   Rectal bleeding    Sleep apnea    Type II diabetes mellitus ([place])     Past Surgical History:   Procedure Laterality Date   AMPUTATION Right 07/24/2016   Procedure: TRANSMETATARSAL FOOT AMPUTATION;  Surgeon: [name], MD;  Location: [place];  Service: Orthopedics;  Laterality: Right;   BALLOON DILATION N/A 03/21/2018   Procedure: BALLOON DILATION;  Surgeon:  Lobo, MD;  Location: [facility] ENDOSCOPY;  Service: Endoscopy;  Laterality: N/A;   BREAST LUMPECTOMY Right ~ 2000   "benign"   CARDIAC CATHETERIZATION N/A 07/30/2015   Procedure: Left Heart Cath and Coronary Angiography;  Surgeon: Burnell Blanks, MD;  Location: [place] [facility];  Service: Cardiovascular;  Laterality: N/A;   CARDIAC CATHETERIZATION N/A 07/19/2016   Procedure: Left Heart Cath and Coronary Angiography;  Surgeon: Belva Crome, MD;  Location: [place] [facility];  Service: Cardiovascular;  Laterality: N/A;   CARDIAC CATHETERIZATION N/A 07/29/2016   Procedure: Coronary Stent Intervention;  Surgeon: Belva Crome, MD;  Location: [place] [facility] LAB;  Service: Cardiovascular;  Laterality: N/A;   CARDIAC CATHETERIZATION N/A 07/29/2016   Procedure: Intravascular Ultrasound/IVUS;  Surgeon: Belva Crome, MD;  Location: [place] [facility];  Service: Cardiovascular;  Laterality: N/A;   CATARACT EXTRACTION W/ INTRAOCULAR LENS  IMPLANT, BILATERAL Bilateral 2014-2015   COLONOSCOPY WITH PROPOFOL N/A 08/22/2017   Procedure: COLONOSCOPY  WITH PROPOFOL;  Surgeon: [name], MD;  Location: [place];  Service: Endoscopy;  Laterality: N/A;   CORONARY ANGIOPLASTY WITH STENT PLACEMENT  10/31/2011   30 % MID ATRIOVENTRICULAR GROOVE CX STENOSIS. 90 % MID RCA.PTA TO LAD/STENTING OF TANDEM 60-70%. LAD STENOSIS 99% REDUCED TO 0%.3.0 X 32MM PROMUS DES POSTDILATED TO 3.25MM.   CORONARY ANGIOPLASTY WITH STENT PLACEMENT  07/03/2014   "2"   CORONARY STENT INTERVENTION N/A 04/09/2019   Procedure: CORONARY STENT INTERVENTION;  Surgeon: Jettie Booze, MD;  Location: [place] [facility];  Service: Cardiovascular;  Laterality: N/A;    CORONARY STENT INTERVENTION N/A 08/22/2019   Procedure: CORONARY STENT INTERVENTION;  Surgeon: Martinique, Peter M, MD;  Location: [place] [facility] LAB;  Service: Cardiovascular;  Laterality: N/A;   ESOPHAGOGASTRODUODENOSCOPY (EGD) WITH PROPOFOL N/A 03/21/2018   Procedure: ESOPHAGOGASTRODUODENOSCOPY (EGD) WITH PROPOFOL;  Surgeon:  Lobo, MD;  Location: [place];  Service: Endoscopy;  Laterality: N/A;   I & D EXTREMITY Right 10/30/2013   Procedure: IRRIGATION AND DEBRIDEMENT RIGHT SMALL FINGER POSSIBLE SECONDARY WOUND CLOSURE;  Surgeon: [name], MD;  [facility];  Service: Orthopedics;  Laterality: Right;  [name] is available after 4pm   I & D EXTREMITY Right 11/01/2013   Procedure:  IRRIGATION AND DEBRIDEMENT RIGHT  PROXIMAL PHALANGEAL LEVEL AMPUTATION;  Surgeon: [name], MD;  [facility];  Service: Orthopedics;  Laterality: Right;   INCISION AND DRAINAGE Right 10/28/2013   Procedure: INCISION AND DRAINAGE Right small finger;  Surgeon: [name], MD;  [facility];  Service: Orthopedics;  Laterality: Right;   LEFT HEART CATH Left 11/03/2011   Procedure: LEFT HEART CATH;  Surgeon: [name], MD;  Location[facility] C[facility] LAB;  Service: Cardiovascular;  Laterality: Left;   LEFT HEART CATH AND CORONARY ANGIOGRAPHY N/A 04/09/2019   Procedure: LEFT HEART CATH AND CORONARY ANGIOGRAPHY;  Surgeon: Jettie Booze, MD;  Location: [place] [facility] LAB;  Service: Cardiovascular;  Laterality: N/A;   LEFT HEART CATHETERIZATION WITH CORONARY ANGIOGRAM N/A 07/03/2014   Procedure: LEFT HEART CATHETERIZATION WITH CORONARY ANGIOGRAM;  Surgeon: [name], MD;  Location: [facility] CATH LAB;  Service: Cardiovascular;  Laterality: N/A;   PERCUTANEOUS CORONARY STENT INTERVENTION (PCI-S) Left 11/03/2011   Procedure: PERCUTANEOUS CORONARY STENT INTERVENTION (PCI-S);  Surgeon: [name], MD;  Location: [facility] CATH LAB;  Service: Cardiovascular;  Laterality: Left;    RIGHT/LEFT HEART CATH AND CORONARY ANGIOGRAPHY N/A 08/22/2019   Procedure: RIGHT/LEFT HEART CATH AND CORONARY ANGIOGRAPHY;  Surgeon: Martinique, Peter M, MD;  Location: [place] CV LAB;  Service: Cardiovascular;  Laterality: N/A;   TOE AMPUTATION Right ~ 2010   "2nd toe"   TOE AMPUTATION Right 2012   "3rd toe"    There were no vitals filed for this visit.   Subjective Assessment - 06/30/21 1018     Subjective "Pretty good a little tired" had a fall Friday but able to catch himself on the wall             No pain                  [facility] Adult PT Treatment/Exercise - 06/30/21 0001       Lumbar Exercises: Aerobic   Nustep L6 x70min      Lumbar Exercises: Machines for Strengthening   Cybex Knee Extension SL 10lb x10 each    Cybex Knee Flexion SL 20lb x10 each    Leg Press 60lb 2x10, 30lb  SL 2x10 each    Other Lumbar Machine Exercise Rows & Lats 55lb 2x12      Lumbar Exercises: Standing   Shoulder Extension Strengthening;Power Tower;Both;20 reps    Other Standing Lumbar Exercises 4in step ups 2x5 each    Other Standing Lumbar Exercises Alt 6 in toe taps 2x10                      PT Short Term Goals - 05/14/21 8811       PT SHORT TERM GOAL #1   Title Patient will be independent with initial HEP.    Status Achieved               PT Long Term Goals - 06/19/21 0849       PT LONG TERM GOAL #1   Title Patient will be independent with advanced HEP.    Status On-going      PT LONG TERM GOAL #3   Title Patient will increase BERG to at least 48/56 to place him at a low risk of falling    Status On-going      PT LONG TERM GOAL #4   Title Patient will increase functional LE and lumbar strength to at least 5/5 to allow him to more easily perform transfers and ambulation.    Status On-going                   Plan - 06/30/21 1054     Clinical Impression Statement Progressed to more functional interventions as well as some SL  strengthening. He did have some difficulty pushing up with step ups. Some LE fatigue with SL strengthening. Decrease ankle DF required pt to increase hip flexion with gait. increase resistance tolerated with seated rows and lats.    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 complexity    Rehab Potential Good    PT Frequency 2x / week    PT Duration 12 weeks    PT Treatment/Interventions ADLs/Self Care Home Management;Cryotherapy;Electrical Stimulation;Iontophoresis 4mg /ml Dexamethasone;Moist Heat;Traction;Ultrasound;Gait training;Stair training;Therapeutic activities;Therapeutic exercise;Balance training;Neuromuscular re-education;Dry needling;Passive range of motion;Spinal Manipulations;Joint Manipulations    PT Next Visit Plan core strength and flexibilty             Patient will benefit from skilled therapeutic intervention in order to improve the following deficits and impairments:  Difficulty walking, Decreased endurance, Increased muscle spasms, Obesity, Decreased activity tolerance, Pain, Decreased balance, Impaired flexibility, Decreased strength, Postural dysfunction  Visit Diagnosis: Muscle weakness (generalized)  Chronic bilateral low back pain without sciatica  Repeated falls  Difficulty in walking, not elsewhere classified  Localized edema     Problem List Patient Active Problem List   Diagnosis Date Noted   Chronic low back pain    Hypokalemia 09/25/2020   Type 2 diabetes mellitus with stage 3 chronic kidney disease ([place]) 09/24/2020   Unstable angina ([place]) 08/17/2019   Chest pain 08/16/2019   NSTEMI (non-ST elevated myocardial infarction) ([place]) 04/06/2019   ACS (acute coronary syndrome) ([place]) 04/06/2019   Sepsis ([place]) 03/15/2018   DKA (diabetic  ketoacidoses) 03/15/2018   Special screening for malignant neoplasms, colon 08/22/2017   Acute renal failure superimposed on stage 3 chronic kidney disease ([place]) 05/19/2017   Essential hypertension    Status post transmetatarsal amputation of foot, right ([place]) 11/08/2016   Diabetic polyneuropathy associated with type 2 diabetes mellitus (  [place]) 11/08/2016   Pure hypercholesterolemia    AKI (acute kidney injury) ([place])    Acute blood loss anemia    DM type 2 with diabetic peripheral neuropathy ([place])    Physical debility 08/03/2016   Chronic osteomyelitis of right foot ([place])    Cellulitis and abscess of leg, except foot    Penetrating wound of right foot    CKD (chronic kidney disease) stage 3, GFR 30-59 ml/min (HCC)    Unstable angina pectoris ([place]) 07/18/2016   Chronic combined systolic and diastolic congestive heart failure ([place])    Chest pain with moderate risk for cardiac etiology 06/24/2014   CAD S/P multiple PCIs 06/24/2014   Sleep apnea- on C-pap 06/24/2014   Morbid obesity due to excess calories ([place]) 11/10/2013   Hyperglycemia 10/29/2013   Hyperlipidemia 01/12/2011   Benign essential HTN 01/12/2011   GERD 01/12/2011    Scot Jun, PTA 06/30/2021, 10:58 AM  [place] [place], [place], [address] Phone: [phone]   Fax:  [phone]  Name: TYLLER BOWLBY MRN: [id number] Date of Birth: Jan 28, 1956

## 2021-07-02 ENCOUNTER — Other Ambulatory Visit: Payer: Self-pay

## 2021-07-02 ENCOUNTER — Ambulatory Visit: Payer: Medicare Other | Admitting: Rehabilitative and Restorative Service Providers"

## 2021-07-02 ENCOUNTER — Encounter: Payer: Self-pay | Admitting: Rehabilitative and Restorative Service Providers"

## 2021-07-02 DIAGNOSIS — M545 Low back pain, unspecified: Secondary | ICD-10-CM

## 2021-07-02 DIAGNOSIS — R262 Difficulty in walking, not elsewhere classified: Secondary | ICD-10-CM

## 2021-07-02 DIAGNOSIS — R296 Repeated falls: Secondary | ICD-10-CM

## 2021-07-02 DIAGNOSIS — G8929 Other chronic pain: Secondary | ICD-10-CM

## 2021-07-02 DIAGNOSIS — M6281 Muscle weakness (generalized): Secondary | ICD-10-CM

## 2021-07-02 DIAGNOSIS — R600 Localized edema: Secondary | ICD-10-CM

## 2021-07-02 NOTE — Therapy (Signed)
[place] [place], [place], [address] Phone: [phone]   Fax:  [phone]  Physical Therapy Treatment  Patient Details  Name: Joshua [name] MRN: [id number] Date of Birth: [birth year] Referring Provider (PT): Benjiman Core, [place]   Encounter Date: 07/02/2021   PT End of Session - 07/02/21 0933     Visit Number 17    Date for PT Re-Evaluation 07/24/21    PT Start Time 0929    PT Stop Time 1010    PT Time Calculation (min) 41 min    Activity Tolerance Patient tolerated treatment well    Behavior During Therapy [facility] for tasks assessed/performed             Past Medical History:  Diagnosis Date   Anemia    Chronic combined systolic and diastolic CHF (congestive heart failure) ([place])    a. 07/2015 Echo: EF 45-50%, mod LVH, mid apical/antsept AK, Gr 1 DD.   CKD (chronic kidney disease), stage III ([place])    Coronary artery disease    a. 2012 s/p MI/PCI: s/p DES to mid/dist RCA and overlapping DES to LAD;  b. 07/2015 Cath: LAD 10% ISR, D1 40(jailed), LCX 80m, OM2 30, OM3 30, RCA 52m ISR, 10d ISR; c. 07/2016 NSTEMI/PCI: LAD 85ost/p/m (3.5x20 Synergy DES overlapping prior stent), D1 40, LCX 82m, OM2 95 (2.75x16 Synergy DES), OM3 30, RCA 47m, 10d.   Depression    Diabetic gastroparesis ([place])    DKA (diabetic ketoacidoses) 03/14/2018   GERD (gastroesophageal reflux disease)    Gout    Heart murmur    Hyperlipemia    Hypertension    Hypertensive heart disease    Ischemic cardiomyopathy    a. 07/2015 Echo: EF 45-50%, mod LVH, mid-apicalanteroseptal AK, Gr1 DD.   Morbid obesity ([place])    OSA on CPAP    Osteomyelitis ([place])    a. 07/2016 MTP joint of R great toe s/p transmetatarsal amputation.   Polyneuropathy in diabetes(357.2)    Pulmonary sarcoidosis ([place])    "problems w/it years ago; no problems anymore" (07/03/2014)   Rectal bleeding    Sleep apnea    Type II diabetes mellitus ([place])     Past Surgical History:   Procedure Laterality Date   AMPUTATION Right 07/24/2016   Procedure: TRANSMETATARSAL FOOT AMPUTATION;  Surgeon: [name], MD;  Location: [place];  Service: Orthopedics;  Laterality: Right;   BALLOON DILATION N/A 03/21/2018   Procedure: BALLOON DILATION;  Surgeon: [name], MD;  Location: [facility] ENDOSCOPY;  Service: Endoscopy;  Laterality: N/A;   BREAST LUMPECTOMY Right ~ 2000   "benign"   CARDIAC CATHETERIZATION N/A 07/30/2015   Procedure: Left Heart Cath and Coronary Angiography;  Surgeon: Burnell Blanks, MD;  Location: [place] CV LAB;  Service: Cardiovascular;  Laterality: N/A;   CARDIAC CATHETERIZATION N/A 07/19/2016   Procedure: Left Heart Cath and Coronary Angiography;  Surgeon: Belva Crome, MD;  Location: [place] CV LAB;  Service: Cardiovascular;  Laterality: N/A;   CARDIAC CATHETERIZATION N/A 07/29/2016   Procedure: Coronary Stent Intervention;  Surgeon: Belva Crome, MD;  Location: [place] CV LAB;  Service: Cardiovascular;  Laterality: N/A;   CARDIAC CATHETERIZATION N/A 07/29/2016   Procedure: Intravascular Ultrasound/IVUS;  Surgeon: Belva Crome, MD;  Location: [place] CV LAB;  Service: Cardiovascular;  Laterality: N/A;   CATARACT EXTRACTION W/ INTRAOCULAR LENS  IMPLANT, BILATERAL Bilateral 2014-2015   COLONOSCOPY WITH PROPOFOL N/A 08/22/2017   Procedure: COLONOSCOPY  WITH PROPOFOL;  Surgeon: [name], MD;  Location: [place];  Service: Endoscopy;  Laterality: N/A;   CORONARY ANGIOPLASTY WITH STENT PLACEMENT  10/31/2011   30 % MID ATRIOVENTRICULAR GROOVE CX STENOSIS. 90 % MID RCA.PTA TO LAD/STENTING OF TANDEM 60-70%. LAD STENOSIS 99% REDUCED TO 0%.3.0 X 32MM PROMUS DES POSTDILATED TO 3.25MM.   CORONARY ANGIOPLASTY WITH STENT PLACEMENT  07/03/2014   "2"   CORONARY STENT INTERVENTION N/A 04/09/2019   Procedure: CORONARY STENT INTERVENTION;  Surgeon: Jettie Booze, MD;  Location: [place] CV LAB;  Service: Cardiovascular;  Laterality: N/A;    CORONARY STENT INTERVENTION N/A 08/22/2019   Procedure: CORONARY STENT INTERVENTION;  Surgeon: Martinique, Peter M, MD;  Location: [place] CV LAB;  Service: Cardiovascular;  Laterality: N/A;   ESOPHAGOGASTRODUODENOSCOPY (EGD) WITH PROPOFOL N/A 03/21/2018   Procedure: ESOPHAGOGASTRODUODENOSCOPY (EGD) WITH PROPOFOL;  Surgeon: [name], MD;  Location: [place];  Service: Endoscopy;  Laterality: N/A;   I & D EXTREMITY Right 10/30/2013   Procedure: IRRIGATION AND DEBRIDEMENT RIGHT SMALL FINGER POSSIBLE SECONDARY WOUND CLOSURE;  Surgeon: [name], MD;  Location: [facility];  Service: Orthopedics;  Laterality: Right;  [name] is available after 4pm   I & D EXTREMITY Right 11/01/2013   Procedure:  IRRIGATION AND DEBRIDEMENT RIGHT  PROXIMAL PHALANGEAL LEVEL AMPUTATION;  Surgeon: [name], MD;  Location: [facility];  Service: Orthopedics;  Laterality: Right;   INCISION AND DRAINAGE Right 10/28/2013   Procedure: INCISION AND DRAINAGE Right small finger;  Surgeon: [name], MD;  Location: [facility];  Service: Orthopedics;  Laterality: Right;   LEFT HEART CATH Left 11/03/2011   Procedure: LEFT HEART CATH;  Surgeon: [name], MD;  Location: [facility] CATH LAB;  Service: Cardiovascular;  Laterality: Left;   LEFT HEART CATH AND CORONARY ANGIOGRAPHY N/A 04/09/2019   Procedure: LEFT HEART CATH AND CORONARY ANGIOGRAPHY;  Surgeon: Jettie Booze, MD;  Location: [place] CV LAB;  Service: Cardiovascular;  Laterality: N/A;   LEFT HEART CATHETERIZATION WITH CORONARY ANGIOGRAM N/A 07/03/2014   Procedure: LEFT HEART CATHETERIZATION WITH CORONARY ANGIOGRAM;  Surgeon: [name], MD;  Location: [facility] C[facility] LAB;  Service: Cardiovascular;  Laterality: N/A;   PERCUTANEOUS CORONARY STENT INTERVENTION (PCI-S) Left 11/03/2011   Procedure: PERCUTANEOUS CORONARY STENT INTERVENTION (PCI-S);  Surgeon: [name], MD;  Location: [facility] CATH LAB;  Service: Cardiovascular;  Laterality: Left;    RIGHT/LEFT HEART CATH AND CORONARY ANGIOGRAPHY N/A 08/22/2019   Procedure: RIGHT/LEFT HEART CATH AND CORONARY ANGIOGRAPHY;  Surgeon: Martinique, Peter M, MD;  Location: [place] CV LAB;  Service: Cardiovascular;  Laterality: N/A;   TOE AMPUTATION Right ~ 2010   "2nd toe"   TOE AMPUTATION Right 2012   "3rd toe"    There were no vitals filed for this visit.   Subjective Assessment - 07/02/21 0932     Subjective I am actually feeling pretty good this morning    Patient Stated Goals I would love to be able to walk and stand and sit up straight for a while without my back bothering me.    Currently in Pain? Yes    Pain Score 1     Pain Location Back    Pain Orientation Lower    Pain Descriptors / Indicators Sore                               [facility] Adult PT Treatment/Exercise - 07/02/21  0001       Lumbar Exercises: Aerobic   UBE (Upper Arm Bike) L2 3 min each way      Lumbar Exercises: Machines for Strengthening   Cybex Knee Extension SL 10lb x10 each    Cybex Knee Flexion SL 20lb x10 each    Leg Press 60lb 2x10, 30lb SL 2x10 each    Other Lumbar Machine Exercise Rows & Lats 55lb 2x12      Lumbar Exercises: Standing   Other Standing Lumbar Exercises 4in step ups 2x5 each    Other Standing Lumbar Exercises Alt 6 in toe taps 2x10                      PT Short Term Goals - 05/14/21 7564       PT SHORT TERM GOAL #1   Title Patient will be independent with initial HEP.    Status Achieved               PT Long Term Goals - 07/02/21 1018       PT LONG TERM GOAL #1   Title Patient will be independent with advanced HEP.    Status On-going      PT LONG TERM GOAL #2   Title Patient will improve TUG to 13 sec for reduced fall risk.    Status Achieved      PT LONG TERM GOAL #3   Title Patient will increase BERG to at least 48/56 to place him at a low risk of falling    Status On-going      PT LONG TERM GOAL #4   Title Patient will  increase functional LE and lumbar strength to at least 5/5 to allow him to more easily perform transfers and ambulation.    Status On-going                   Plan - 07/02/21 1014     Clinical Impression Statement Mr Garlow continues to progress with goal related activities.  He does have difficulty with step ups without UE support on 4 inch step and requires close SBA secondary to instability.  He requires occasional seated recovery periods with cuing for breathing.  He continues to require skilled PT to progress towards goal related activities.    PT Treatment/Interventions ADLs/Self Care Home Management;Cryotherapy;Electrical Stimulation;Iontophoresis 4mg /ml Dexamethasone;Moist Heat;Traction;Ultrasound;Gait training;Stair training;Therapeutic activities;Therapeutic exercise;Balance training;Neuromuscular re-education;Dry needling;Passive range of motion;Spinal Manipulations;Joint Manipulations    PT Next Visit Plan core strength and flexibilty             Patient will benefit from skilled therapeutic intervention in order to improve the following deficits and impairments:  Difficulty walking, Decreased endurance, Increased muscle spasms, Obesity, Decreased activity tolerance, Pain, Decreased balance, Impaired flexibility, Decreased strength, Postural dysfunction  Visit Diagnosis: Muscle weakness (generalized)  Chronic bilateral low back pain without sciatica  Repeated falls  Difficulty in walking, not elsewhere classified  Localized edema     Problem List Patient Active Problem List   Diagnosis Date Noted   Chronic low back pain    Hypokalemia 09/25/2020   Type 2 diabetes mellitus with stage 3 chronic kidney disease ([place]) 09/24/2020   Unstable angina ([place]) 08/17/2019   Chest pain 08/16/2019   NSTEMI (non-ST elevated myocardial infarction) ([place]) 04/06/2019   ACS (acute coronary syndrome) ([place]) 04/06/2019   Sepsis ([place]) 03/15/2018   DKA (diabetic ketoacidoses)  03/15/2018   Special screening for malignant neoplasms, colon 08/22/2017   Acute renal  failure superimposed on stage 3 chronic kidney disease ([place]) 05/19/2017   Essential hypertension    Status post transmetatarsal amputation of foot, right ([place]) 11/08/2016   Diabetic polyneuropathy associated with type 2 diabetes mellitus ([place]) 11/08/2016   Pure hypercholesterolemia    AKI (acute kidney injury) ([place])    Acute blood loss anemia    DM type 2 with diabetic peripheral neuropathy ([place])    Physical debility 08/03/2016   Chronic osteomyelitis of right foot ([place])    Cellulitis and abscess of leg, except foot    Penetrating wound of right foot    CKD (chronic kidney disease) stage 3, GFR 30-59 ml/min ([facility])    Unstable angina pectoris ([place]) 07/18/2016   Chronic combined systolic and diastolic congestive heart failure ([place])    Chest pain with moderate risk for cardiac etiology 06/24/2014   CAD S/P multiple PCIs 06/24/2014   Sleep apnea- on C-pap 06/24/2014   Morbid obesity due to excess calories ([place]) 11/10/2013   Hyperglycemia 10/29/2013   Hyperlipidemia 01/12/2011   Benign essential HTN 01/12/2011   GERD 01/12/2011    Juel Burrow, PT, DPT 07/02/2021, 10:21 AM  [address] [place], [place], [address] Phone: [phone]   Fax:  [phone]  Name: Joshua [name] MRN: [id number] Date of Birth: October 23, 1956

## 2021-07-13 ENCOUNTER — Telehealth: Payer: Self-pay | Admitting: Internal Medicine

## 2021-07-13 NOTE — Telephone Encounter (Signed)
Insurance is needing approval for pt when it comes to pt taking this medication . Insurance is stating that pt will need the okay that he can be on this medication.Pt voiced that Next refill insurance will not refill it if this is not done. Medicare is stating this about patient. Pt would like a call back what to do.  insulin aspart (NOVOLOG) 100 UNIT/ML injection  Insulin Glargine (BASAGLAR KWIKPEN) 100 UNIT/ML

## 2021-07-14 DIAGNOSIS — T25211A Burn of second degree of right ankle, initial encounter: Secondary | ICD-10-CM | POA: Diagnosis not present

## 2021-07-14 NOTE — Telephone Encounter (Signed)
Pt contacted via mychart

## 2021-07-17 DIAGNOSIS — T25211D Burn of second degree of right ankle, subsequent encounter: Secondary | ICD-10-CM | POA: Diagnosis not present

## 2021-07-21 DIAGNOSIS — T25211D Burn of second degree of right ankle, subsequent encounter: Secondary | ICD-10-CM | POA: Diagnosis not present

## 2021-07-27 ENCOUNTER — Encounter: Payer: Self-pay | Admitting: Dietician

## 2021-07-27 ENCOUNTER — Encounter: Payer: Self-pay | Admitting: Internal Medicine

## 2021-07-27 ENCOUNTER — Encounter: Payer: Medicare Other | Attending: Internal Medicine | Admitting: Dietician

## 2021-07-27 ENCOUNTER — Other Ambulatory Visit: Payer: Self-pay | Admitting: Internal Medicine

## 2021-07-27 ENCOUNTER — Other Ambulatory Visit: Payer: Self-pay

## 2021-07-27 DIAGNOSIS — E1142 Type 2 diabetes mellitus with diabetic polyneuropathy: Secondary | ICD-10-CM | POA: Diagnosis not present

## 2021-07-27 DIAGNOSIS — N184 Chronic kidney disease, stage 4 (severe): Secondary | ICD-10-CM | POA: Diagnosis not present

## 2021-07-27 MED ORDER — LANTUS SOLOSTAR 100 UNIT/ML ~~LOC~~ SOPN: 30.0000 [IU] | PEN_INJECTOR | Freq: Two times a day (BID) | SUBCUTANEOUS | 3 refills | Status: DC

## 2021-07-27 MED ORDER — HUMALOG KWIKPEN 200 UNIT/ML ~~LOC~~ SOPN: 10.0000 [IU] | PEN_INJECTOR | Freq: Three times a day (TID) | SUBCUTANEOUS | 3 refills | Status: DC

## 2021-07-27 NOTE — Patient Instructions (Addendum)
Stay active.  Aim for 30 minutes most days. Be sure you are drinking adequate fluid  Choose beverages that are carbohydrate free Low sodium Consistent blood glucose monitoring Aim for more regular meals  Continue to incorporate more vegetables in your diet.  Call your cardiologist for an appointment.  Watch your weight gain - ? Fluid.  Watch your salt intake.  Grip in to the positive habits

## 2021-07-27 NOTE — Progress Notes (Signed)
Medical Nutrition Therapy  Appointment Start time:  8288  [id number] End time:  7998 Patient is here today alone.  He was last seen by this RD 03/02/2021.  Primary concerns today: follow up  Referral diagnosis: Type 2 Diabetes Preferred learning style: no preference indicated Learning readiness: contemplating    NUTRITION ASSESSMENT  States that his appetite has now returned to normal. He continues to have problems with consistency of meals and eats only 1-2 meals per day and currently denies excessive eating at that meal. Problems falling asleep and with sleep quality. Food card provided by his insurance company has helped with food security. His wife is cooking more although higher sodium at times (patient reports that it does not taste excessive). He is concerned about recent weight gain of 10 lbs in the past week.  He verbalized need to follow up with his cardiologist. Craving fruit (particularly watermelon) and tea. He states that his kidney function is now back to normal and should limit to 2 Liters daily per his nephrologist. He is now on Ozempic. He is not checking his blood sugar routinely.  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 eGFR 19 09/28/2020 Notable Signs/Symptoms: fluid retention (swelling leg, foot)  Lifestyle & Dietary Hx Patient lives with his wife and three sons.  His son is paying the mortgage and helps out a lot with finances.  His wife has mobility issus and recently lost her job. She is not expected to be able to return to work.  Shopping causes him increased physical pain.  He finds it difficult to meet his own nutritional needs as well as the preferences of his family and overall is not cooking.  States that his son's now frequently get take out. 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 Middle son has type 2 diabetes and recently diagnosed.  Estimated daily fluid intake: "not drinking enough water"  "beer intake increased"   Supplements:  Sleep: poor quality "not deep:, only about 5 hours at night.  C-pap is working better. Stress / self-care: financial, disability of wife, difficulty with stress Current average weekly physical activity: PT through July for balance issues and back.  He has continued the stretching at home but not a lot of exercise   24-Hr Dietary Recall First Meal: usually skips Snack: none Second Meal: usually skips Snack: watermelon Third Meal: Pizza OR casserole (pasta, hamburger, cheese, pepperoni), salad Snack: donuts Beverages: water, beer, tea (unsweetened or sweet)   NUTRITION DIAGNOSIS  NB-1.1 Food and nutrition-related knowledge deficit As related to balance of carbohydrate, protein, and fat.  As evidenced by diet hx and patient report.   NUTRITION INTERVENTION  Nutrition education (E-1) on the following topics:  Review of sodium/hidden sodium and recommendations to read labels and limit to 600 mg per meal Fluid guidelines and need for adequate water as well as fluid choice Importance of consistency of exercise Fiber, sources,  goals  Handouts Provided Include  Low sodium nutrition therapy from [place] [facility] Teaching method utilized: Visual & Auditory  Demonstrated degree of understanding via: Teach Back  Barriers to learning/adherence to lifestyle change: finances, difficulty cooking  Goals Established by Pt Stay active.  Aim for 30 minutes most days. Be sure you are drinking adequate fluid  Choose beverages that are carbohydrate free Low sodium Consistent blood glucose monitoring Aim for more regular meals  Continue to incorporate more vegetables in your diet.  Call your cardiologist for an appointment.  Watch your weight gain - ? Fluid.  Watch your salt intake.  Grip in to the positive habits  MONITORING & EVALUATION Dietary intake, weekly physical activity, and label reading in 2-3 months.  Next Steps  Patient is to call as needed.

## 2021-07-29 DIAGNOSIS — T25211D Burn of second degree of right ankle, subsequent encounter: Secondary | ICD-10-CM | POA: Diagnosis not present

## 2021-08-07 ENCOUNTER — Telehealth: Payer: Self-pay | Admitting: Cardiovascular Disease

## 2021-08-07 NOTE — Telephone Encounter (Signed)
Pt sent this Via Mychart message to sching pool:   I'm taking furosemide $RemoveBeforeDEI'80mg'[id number]$  tab and a half twice a day    Firestine, Treyvone [name]  Patient Appointment Schedule Request Pool 6 minutes ago (4:15 PM)   I just had a nurse visit from a [place] health care nurse who noted that it appeared to her that I was swollen all over. The swelling can really be seen in  my feet and ankles. I have gained 13lb since the 8/15 but I  haven't been eating anymore. I have experienced some angina and I'm having some pretty good shortness of breath walking. The shortness of breath isn't always as bad but I've noticed it more. I haven't traveled much except to [place] [employer] from [place] on the last week    You  Hoare, Joshua [name] 43 minutes ago (3:37 PM)   SS Mr Joshua [name], Can you tell me a little bit more about your swelling really quickly?     If swelling, where is the swelling located?    How much weight have you gained and in what time span?    Have you gained 3 pounds in a day or 5 pounds in a week?    Do you have a log of your daily weights (if so, list)?    Are you currently taking a fluid pill?    Are you currently SOB?    Have you traveled recently?       Joshua [name] [name]  Patient Appointment Schedule Request Pool 45 minutes ago (3:35 PM)   Appointment Request From: Thana Ates   With Provider: [name], MD [facility]]   Preferred Date Range: 08/10/2021 - 08/13/2021   Preferred Times: Any Time   Reason for visit: Office Visit   Comments: Excess fluid. Gained 13lbs since last week. Angina

## 2021-08-07 NOTE — Telephone Encounter (Signed)
Returned call to patient who states that he was seen by a home health care nurse this morning and states he noticed he was swollen all over. Patient weighed himself at that time and realized he had gained 13 pounds since 07/27/21. Patient reports that he is slightly short of breath but states it is not new for him, but states it just occurs more frequently now with exertion. Patient states that he also had an episode of what he called Angina the other day. Patient reports a Left sided chest pain in which he took a Nitro and the pain was relieved. Patient states that he is not currently having any symptoms besides swelling at present. Patient reports that he is taking $RemoveB'120mg'UqcygwsH$  Lasix twice daily, and is following with Nephrology due to his elevated creatinine. Patient states he is taking his brilinta and other cardiac meds as prescribed. Made patient an appointment at next available which is with Coletta Memos, NP on Tuesday 8/30 at 3:15pm at [place]. Gave patient strict ED precautions should new or worsening symptoms develop.   Spoke with DOD ([name]) who agreed with plan, and to have patient call his Nephrologist as well.   Advised patient to call his Nephrologist today to see if they have any recommendations regarding his issues. Patient is aware of all instructions and verbalized understanding.   Will forward to MD and to Coletta Memos, NP to make aware.

## 2021-08-08 ENCOUNTER — Other Ambulatory Visit: Payer: Self-pay | Admitting: Physician Assistant

## 2021-08-10 NOTE — Progress Notes (Addendum)
Cardiology Office Note:    Date:  08/11/2021   ID:  Joshua [name], DOB 09/04/1956, MRN [id number]  PCP:  [name], MD   [facility] Providers Cardiologist:  [name], MD      Referring MD: [name], MD   Presents to the clinic today for follow-up evaluation of his coronary artery disease and chronic combined systolic and diastolic CHF.  History of Present Illness:    Joshua [name] is a 65 y.o. male with a hx of coronary artery disease status post PCI with DES to his LAD and RCA in 2012.  He underwent subsequent PCI with DES to LAD and circumflex in 2017 and PCI to LAD for ISR 4/20, PCI with DES to, PCI with DES to P LAD 9/20, combined chronic CHF, HTN, HLD, type 2 diabetes, status post right metatarsal amputation in 2017, osteomyelitis, GERD, CKD stage IV, obstructive sleep apnea, and obesity.  He was seen and evaluated by Dr. Claiborne Billings 3/21.  At that time he was doing well from a cardiac standpoint.  He denied shortness of breath and chest discomfort.  His echocardiogram 4/20 showed an LVEF of 55-60% with intermediate diastolic dysfunction, suboptimal acoustic windows limited evaluation for wall motion abnormalities, and no significant valvular abnormalities.  He was seen by Roby Lofts, PA-C on 08/26/2020.  During that time he continued to do well from a cardiac standpoint.  He continued to have DOE however, it had improved since his previous cardiology visit.  He denied chest pain.  He reported that he had been out of Imdur for couple months with no issues and had restarted 120 mg daily.  He had lost around 30 pounds with dietary changes.  Who was congratulated on his weight loss.  He did note some dizziness/lightheadedness with waking up in the morning.  He also noted that he had been struggling somewhat with his CPAP mask.  It had been leaking and he was therefore not as compliant with his CPAP over the prior 2 months.  He was seen by Dr. Claiborne Billings 02/23/2021.  During  that time he continued to deny chest discomfort.  He was meeting his compliance standards for his CPAP use and averaging 6 hours and 8 minutes of use per night.  He had received a new CPAP mask which had helped with fitting his face and leaks.  He presents the clinic today for follow-up evaluation states over the past several days he has noticed left chest discomfort.  He has also noticed increased work of breathing with increased physical activity.  He reports that he has taken nitroglycerin 3 times over the past 3 weeks.  He reports that his nitroglycerin is old and that it took about 30 minutes for the medication to work.  His chest discomfort on all occasions came on at rest.  He does not notice chest discomfort with increased exertion.  He did not take his medications today.  His blood pressure is 152/76.  He reports that when he takes his amlodipine Imdur, and metoprolol his blood pressure runs in the 623J systolic.  Nephrology has been dosing his furosemide.  We reviewed the importance of low-sodium diet and fluid restriction.  Due to his CHF I will order an echocardiogram, give him a fluid restriction of no more than 64 ounces daily, have recommended that he chew sugar-free gum or sugar-free candy, will increase his Imdur to 180 mg daily, have him weigh himself daily, and follow-up after his echocardiogram.  His chest  discomfort is reproducible with deep palpation on exam.  Today he denies chest pain, shortness of breath, lower extremity edema, fatigue, palpitations, melena, hematuria, hemoptysis, diaphoresis, weakness, presyncope, syncope, orthopnea, and PND.   Past Medical History:  Diagnosis Date   Acute coronary syndrome (HCC)    Anemia    B12 deficiency    Chest pain    CHF (congestive heart failure) (HCC)    Chronic combined systolic and diastolic CHF (congestive heart failure) ([place])    a. 07/2015 Echo: EF 45-50%, mod LVH, mid apical/antsept AK, Gr 1 DD.   Chronic low back pain    CKD  (chronic kidney disease), stage III (HCC)    Constipation    COPD (chronic obstructive pulmonary disease) (HCC)    Coronary artery disease    a. 2012 s/p MI/PCI: s/p DES to mid/dist RCA and overlapping DES to LAD;  b. 07/2015 Cath: LAD 10% ISR, D1 40(jailed), LCX 19m OM2 30, OM3 30, RCA 564mSR, 10d ISR; c. 07/2016 NSTEMI/PCI: LAD 85ost/p/m (3.5x20 Synergy DES overlapping prior stent), D1 40, LCX 7591mM2 95 (2.75x16 Synergy DES), OM3 30, RCA 18m42md.   Depression    Diabetic gastroparesis (HCC)[place] Diabetic polyneuropathy (HCC)[place] DKA (diabetic ketoacidoses) 03/14/2018   GERD (gastroesophageal reflux disease)    Gout    Heart failure (HCC)    Heart murmur    History of MI (myocardial infarction)    Hyperlipemia    Hypertension    Hypertensive heart disease    Hypokalemia    Ischemic cardiomyopathy    a. 07/2015 Echo: EF 45-50%, mod LVH, mid-apicalanteroseptal AK, Gr1 DD.   Joint pain    Kidney problem    Morbid obesity (HCC)[place] OSA on CPAP    Osteoarthritis    Osteomyelitis (HCC)[place] a. 07/2016 MTP joint of R great toe s/p transmetatarsal amputation.   Other fatigue    Polyneuropathy in diabetes(357.2)    Pulmonary sarcoidosis (HCC)[place] "problems w/it years ago; no problems anymore" (07/03/2014)   Rectal bleeding    Shortness of breath    Shortness of breath on exertion    Sleep apnea    Stomach ulcer    Swallowing difficulty    Type II diabetes mellitus (HCC)[place]  Past Surgical History:  Procedure Laterality Date   AMPUTATION Right 07/24/2016   Procedure: TRANSMETATARSAL FOOT AMPUTATION;  Surgeon: MarcNewt Minion;  [facility];  Laterality: Right;   BALLOON DILATION N/A 03/21/2018   Procedure: BALLOON DILATION;  Surgeon: BuccRonald Lobo;  [facility];  Service: Endoscopy;  Laterality: N/A;   BREAST LUMPECTOMY Right ~ 2000   "benign"   CARDIAC CATHETERIZATION N/A 07/30/2015   Procedure: Left Heart Cath and Coronary Angiography;   Surgeon: ChriBurnell Blanks;  [facility];  Service: Cardiovascular;  Laterality: N/A;   CARDIAC CATHETERIZATION N/A 07/19/2016   Procedure: Left Heart Cath and Coronary Angiography;  Surgeon: HenrBelva Crome;  [facility];  Service: Cardiovascular;  Laterality: N/A;   CARDIAC CATHETERIZATION N/A 07/29/2016   Procedure: Coronary Stent Intervention;  Surgeon: HenrBelva Crome;  [facility];  Service: Cardiovascular;  Laterality: N/A;   CARDIAC CATHETERIZATION N/A 07/29/2016   Procedure: Intravascular Ultrasound/IVUS;  Surgeon: HenrBelva Crome;  [facility];  Service: Cardiovascular;  Laterality: N/A;   CATARACT EXTRACTION W/  INTRAOCULAR LENS  IMPLANT, BILATERAL Bilateral 2014-2015   COLONOSCOPY WITH PROPOFOL N/A 08/22/2017   Procedure: COLONOSCOPY WITH PROPOFOL;  Surgeon: [name], MD;  Location: [place];  Service: Endoscopy;  Laterality: N/A;   CORONARY ANGIOPLASTY WITH STENT PLACEMENT  10/31/2011   30 % MID ATRIOVENTRICULAR GROOVE CX STENOSIS. 90 % MID RCA.PTA TO LAD/STENTING OF TANDEM 60-70%. LAD STENOSIS 99% REDUCED TO 0%.3.0 X 32MM PROMUS DES POSTDILATED TO 3.25MM.   CORONARY ANGIOPLASTY WITH STENT PLACEMENT  07/03/2014   "2"   CORONARY STENT INTERVENTION N/A 04/09/2019   Procedure: CORONARY STENT INTERVENTION;  Surgeon: Jettie Booze, MD;  Location: [place] CV LAB;  Service: Cardiovascular;  Laterality: N/A;   CORONARY STENT INTERVENTION N/A 08/22/2019   Procedure: CORONARY STENT INTERVENTION;  Surgeon: Martinique, Peter M, MD;  Location: [place] CV LAB;  Service: Cardiovascular;  Laterality: N/A;   ESOPHAGOGASTRODUODENOSCOPY (EGD) WITH PROPOFOL N/A 03/21/2018   Procedure: ESOPHAGOGASTRODUODENOSCOPY (EGD) WITH PROPOFOL;  Surgeon: [name], MD;  Location: [place];  Service: Endoscopy;  Laterality: N/A;   I & D EXTREMITY Right 10/30/2013   Procedure: IRRIGATION AND DEBRIDEMENT RIGHT  SMALL FINGER POSSIBLE SECONDARY WOUND CLOSURE;  Surgeon: [name], MD;  Location: [facility];  Service: Orthopedics;  Laterality: Right;  [name] is available after 4pm   I & D EXTREMITY Right 11/01/2013   Procedure:  IRRIGATION AND DEBRIDEMENT RIGHT  PROXIMAL PHALANGEAL LEVEL AMPUTATION;  Surgeon: [name], MD;  Location: [facility];  Service: Orthopedics;  Laterality: Right;   INCISION AND DRAINAGE Right 10/28/2013   Procedure: INCISION AND DRAINAGE Right small finger;  Surgeon: [name], MD;  Location: [facility];  Service: Orthopedics;  Laterality: Right;   LEFT HEART CATH Left 11/03/2011   Procedure: LEFT HEART CATH;  Surgeon: [name], MD;  Location: [facility] C[facility] LAB;  Service: Cardiovascular;  Laterality: Left;   LEFT HEART CATH AND CORONARY ANGIOGRAPHY N/A 04/09/2019   Procedure: LEFT HEART CATH AND CORONARY ANGIOGRAPHY;  Surgeon: Jettie Booze, MD;  Location: [place] CV LAB;  Service: Cardiovascular;  Laterality: N/A;   LEFT HEART CATHETERIZATION WITH CORONARY ANGIOGRAM N/A 07/03/2014   Procedure: LEFT HEART CATHETERIZATION WITH CORONARY ANGIOGRAM;  Surgeon: [name], MD;  Location[facility] LAB;  Service: Cardiovascular;  Laterality: N/A;   PERCUTANEOUS CORONARY STENT INTERVENTION (PCI-S) Left 11/03/2011   Procedure: PERCUTANEOUS CORONARY STENT INTERVENTION (PCI-S);  Surgeon: [name], MD;  Location: [facility] CATH LAB;  Service: Cardiovascular;  Laterality: Left;   RIGHT/LEFT HEART CATH AND CORONARY ANGIOGRAPHY N/A 08/22/2019   Procedure: RIGHT/LEFT HEART CATH AND CORONARY ANGIOGRAPHY;  Surgeon: Martinique, Peter M, MD;  Location: [place] CV LAB;  Service: Cardiovascular;  Laterality: N/A;   TOE AMPUTATION Right ~ 2010   "2nd toe"   TOE AMPUTATION Right 2012   "3rd toe"    Current Medications: Current Meds  Medication Sig   Accu-Chek FastClix Lancets MISC 1 each by Other route in the morning, at noon, in the evening, and at bedtime.    ACCU-CHEK  GUIDE test strip USE UP TO FOUR TIMES DAILY AS DIRECTED (Patient taking differently: 1 each by Other route See admin instructions. USE UP TO FOUR TIMES DAILY AS DIRECTED)   acetaminophen (TYLENOL) 650 MG CR tablet Take 1,300 mg by mouth daily as needed for pain.   allopurinol (ZYLOPRIM) 300 MG tablet Take 300 mg by mouth daily.    amLODipine (NORVASC) 10 MG tablet Take 1 tablet by mouth once  daily   aspirin EC 81 MG tablet Take 81 mg by mouth daily.    blood glucose meter kit and supplies Dispense based on patient and insurance preference. Use up to four times daily as directed. (FOR ICD-10 E10.9, E11.9). (Patient taking differently: 1 each by Other route See admin instructions. Dispense based on patient and insurance preference. Use up to four times daily as directed. (FOR ICD-10 E10.9, E11.9).)   BRILINTA 90 MG TABS tablet Take 1 tablet by mouth twice daily   famotidine (PEPCID) 20 MG tablet Take 20 mg by mouth at bedtime.   furosemide (LASIX) 40 MG tablet Take 40 mg by mouth 2 (two) times daily.   gabapentin (NEURONTIN) 100 MG capsule Take 1 capsule (100 mg total) by mouth at bedtime.   insulin glargine (LANTUS SOLOSTAR) 100 UNIT/ML Solostar Pen Inject 30 Units into the skin 2 (two) times daily.   insulin lispro (HUMALOG KWIKPEN) 200 UNIT/ML KwikPen Inject 10-18 Units into the skin 3 (three) times daily before meals.   isosorbide mononitrate (IMDUR) 60 MG 24 hr tablet Take 3 tablets (180 mg total) by mouth daily.   Lidocaine HCl-Benzyl Alcohol (SALONPAS LIDOCAINE PLUS) 4-10 % CREA Apply 1 patch topically as needed.   metoprolol succinate (TOPROL-XL) 25 MG 24 hr tablet Take 25 mg by mouth daily. Take 3 tablets (86m total) by mouth in the morning and 2 tablets (587mtotal) in the evening.   nitroGLYCERIN (NITROSTAT) 0.4 MG SL tablet Place 1 tablet (0.4 mg total) under the tongue every 5 (five) minutes as needed for chest pain.   pantoprazole (PROTONIX) 40 MG tablet TAKE 1 TABLET BY MOUTH TWICE  DAILY BEFORE MEAL(S)   Semaglutide, 1 MG/DOSE, (OZEMPIC, 1 MG/DOSE,) 4 MG/3ML SOPN Inject 1 mg into the skin once a week.   tretinoin (RETIN-A) 0.1 % cream Apply topically daily.     Allergies:   Chlorthalidone   Social History   Socioeconomic History   Marital status: Married    Spouse name: ElDorian Pod Number of children: 3   Years of education: Not on file   Highest education level: Not on file  Occupational History    Comment: disabled  Tobacco Use   Smoking status: Never   Smokeless tobacco: Never  Vaping Use   Vaping Use: Never used  Substance and Sexual Activity   Alcohol use: Yes    Comment: social    Drug use: No   Sexual activity: Yes    Partners: Female  Other Topics Concern   Not on file  Social History Narrative   Not on file   Social Determinants of Health   Financial Resource Strain: Not on file  Food Insecurity: Not on file  Transportation Needs: Not on file  Physical Activity: Not on file  Stress: Not on file  Social Connections: Not on file     Family History: The patient's family history includes Diabetes in his maternal grandmother; Heart attack in his maternal aunt; Hypertension in his maternal grandmother; Sudden death in his mother and another family member.  ROS:   Please see the history of present illness.     All other systems reviewed and are negative.   Risk Assessment/Calculations:           Physical Exam:    VS:  BP (!) 152/76   Pulse 82   Ht '5\' 9"'  (1.753 m)   Wt (!) 328 lb (148.8 kg)   SpO2 97%   BMI 48.44 kg/m  Wt Readings from Last 3 Encounters:  08/11/21 (!) 328 lb (148.8 kg)  08/11/21 (!) 323 lb (146.5 kg)  06/17/21 (!) 302 lb (137 kg)     GEN:  Well nourished, well developed in no acute distress HEENT: Normal NECK: No JVD; No carotid bruits LYMPHATICS: No lymphadenopathy CARDIAC: RRR, no murmurs, rubs, gallops, +1 bilateral lower extremity pitting edema RESPIRATORY:  Clear to auscultation without rales,  wheezing or rhonchi  ABDOMEN: Soft, non-tender, non-distended MUSCULOSKELETAL:  No deformity  SKIN: Warm and dry NEUROLOGIC:  Alert and oriented x 3 PSYCHIATRIC:  Normal affect    EKGs/Labs/Other Studies Reviewed:    The following studies were reviewed today:  Echocardiogram 04/07/2019  IMPRESSIONS     1. The left ventricle has normal systolic function, with an ejection  fraction of 55-60%. The cavity size was normal. Indeterminate diastolic  filling due to E-A fusion.   2. LV regional wall motion abnormalities could not be completely assessed  due to suboptimal acoustic windows. Grossly normal wall motion.   3. The inferior vena cava was dilated in size with <50% respiratory  variability.   FINDINGS   Left Ventricle: The left ventricle has normal systolic function, with an  ejection fraction of 55-60%. The cavity size was normal. There is no  increase in left ventricular wall thickness. Indeterminate diastolic  filling due to E-A fusion.      Right Ventricle: The right ventricle has normal systolic function. The  cavity was normal. There is right vetricular wall thickness was not  assessed.   Left Atrium: Left atrial size was not well visualized.   Right Atrium: Right atrial size was not well visualized. Right atrial  pressure is estimated at 15 mmHg.   Interatrial Septum: The interatrial septum was not well visualized.   Pericardium: There is no evidence of pericardial effusion.   Mitral Valve: The mitral valve is normal in structure. Mitral valve  regurgitation is not visualized by color flow Doppler.   Tricuspid Valve: Tricuspid valve regurgitation was not visualized by color  flow Doppler.   Aortic Valve: The aortic valve is normal in structure. Aortic valve  regurgitation was not visualized by color flow Doppler.   Pulmonic Valve: The pulmonic valve was grossly normal. Pulmonic valve  regurgitation is trivial by color flow Doppler.   Venous: The inferior  vena cava is dilated in size with less than 50%  respiratory variability.    EKG:  EKG is  ordered today.  The ekg ordered today demonstrates normal sinus rhythm no ST or T wave deviation 82 bpm  Recent Labs: 09/26/2020: ALT 207; Magnesium 2.3 09/28/2020: BUN 51; Creatinine, Ser 3.24; Hemoglobin 11.8; Platelets 159; Potassium 3.8; Sodium 136  Recent Lipid Panel    Component Value Date/Time   CHOL 168 08/17/2019 0402   TRIG 60 08/17/2019 0402   HDL 85 08/17/2019 0402   CHOLHDL 2.0 08/17/2019 0402   VLDL 12 08/17/2019 0402   LDLCALC 71 08/17/2019 0402    ASSESSMENT & PLAN    Coronary artery disease-reports 3 separate episodes of chest discomfort that came on at rest and were relieved by nitroglycerin.  He has nitroglycerin once old and has now been replaced.  Chest discomfort reproducible with deep palpation.  Discussed that it is probably related to musculoskeletal causes.  Cardiac catheterization 4/20 showed widely patent LAD stent, 50% mid circumflex lesion, 40% proximal-mid RCA stenosis, ostial LAD-proximal LAD 90% stenosis.  He received scoring balloon angioplasty.  Proximal LAD 90% stenosed. Continue amlodipine,  metoprolol aspirin, Brilinta Increase Imdur 180 daily Heart healthy low-sodium diet-salty 6 given Increase physical activity as tolerated  Hyperlipidemia-LDL 81 on 06/02/21. Continue aspirin Heart healthy low-sodium high-fiber diet Increase physical activity as tolerated  Chronic combined systolic and diastolic CHF-+1 bilateral pitting lower extremity edema.  Reports chronic dyspnea with increased physical activity.  Echocardiogram 4/20 showed normal LVEF. Continue metoprolol, hydralazine, furosemide, Imdur. Heart healthy low-sodium diet-salty 6 given Increase physical activity as tolerated Lower extremity support stockings Daily weights  Essential hypertension-BP today 149/77.  Reports blood pressure in the 130s over 70s when he is compliant with his medications.   He is working with the weight loss clinic.  Continue metoprolol, Imdur, amlodipine Heart healthy low-sodium diet-salty 6 given Increase physical activity as tolerated Maintain blood pressure log  OSA-reports compliance with CPAP.  Continues to have minimal leakage with new facemask. Continue CPAP use  Disposition: Follow-up with Dr. Claiborne Billings or me in 1 months.       Medication Adjustments/Labs and Tests Ordered: Current medicines are reviewed at length with the patient today.  Concerns regarding medicines are outlined above.  Orders Placed This Encounter  Procedures   EKG 12-Lead   ECHOCARDIOGRAM COMPLETE   Meds ordered this encounter  Medications   isosorbide mononitrate (IMDUR) 60 MG 24 hr tablet    Sig: Take 3 tablets (180 mg total) by mouth daily.    Dispense:  90 tablet    Refill:  3    Patient Instructions  Medication Instructions:   INCREASE Isosorbide to 180 mg daily---take three 60 mg tablets daily  *If you need a refill on your cardiac medications before your next appointment, please call your pharmacy*   Testing/Procedures: Your physician has requested that you have an echocardiogram. Echocardiography is a painless test that uses sound waves to create images of your heart. It provides your doctor with information about the size and shape of your heart and how well your heart's chambers and valves are working. This procedure takes approximately one hour. There are no restrictions for this procedure.   Follow-Up: At [facility], you and your health needs are our priority.  As part of our continuing mission to provide you with exceptional heart care, we have created designated Provider Care Teams.  These Care Teams include your primary Cardiologist (physician) and Advanced Practice Providers (APPs -  Physician Assistants and Nurse Practitioners) who all work together to provide you with the care you need, when you need it.  We recommend signing up for the patient  portal called "MyChart".  Sign up information is provided on this After Visit Summary.  MyChart is used to connect with patients for Virtual Visits (Telemedicine).  Patients are able to view lab/test results, encounter notes, upcoming appointments, etc.  Non-urgent messages can be sent to your provider as well.   To learn more about what you can do with MyChart, go to [email or web address].    Your next appointment:   1-2 month(s)  The format for your next appointment:   In Person  Provider:   Coletta Memos, FNP   Other Instructions Coletta Memos, FNP has recommended fluid restriction of no more than 64 oz of fluid per day. He is also recommending that you wear compression stockings to help with swelling in your legs and weighing yourself daily. Please call our office at [phone] if you notice weight gain of 2 lbs overnight or 5 lbs in one week. Please review the information below. Thank you!    PLEASE  PURCHASE AND WEAR COMPRESSION STOCKINGS DAILY AND TAKE OFF AT BEDTIME. Compression stockings are elastic socks that squeeze the legs. They help to increase blood flow to the legs and to decrease swelling in the legs from fluid retention, and reduce the chance of developing blood clots in the lower legs. Please put on in the AM when dressing and off at night when dressing for bed.  LET THEM KNOW THAT YOU NEED KNEE HIGH'S WITH COMPRESSION OF 15-20 mmhg.  [employer]  [place] ([place] [phone]); [place], [place] [address]; [phone]  EMAIL   [email or web address].  PLEASE MAKE SURE TO ELEVATE YOUR FEET & LEGS WHILE SITTING, THIS WILL HELP WITH THE SWELLING ALSO.      Daily Weight Record: It is important to weigh yourself daily. Keep your daily weight chart near your scale. Weigh yourself each morning at the same time. Weigh yourself without shoes, and try to wear the same amount of clothing each day. Compare today's weight to yesterday's weight.  Bring this log  with you to your follow-up appointments.          Signed, Deberah Pelton, NP  08/11/2021 4:03 PM      Notice: This dictation was prepared with Dragon dictation along with smaller phrase technology. Any transcriptional errors that result from this process are unintentional and may not be corrected upon review.  I spent 15 minutes examining this patient, reviewing medications, and using patient centered shared decision making involving her cardiac care.  Prior to her visit I spent greater than 20 minutes reviewing her past medical history,  medications, and prior cardiac tests.

## 2021-08-11 ENCOUNTER — Ambulatory Visit (INDEPENDENT_AMBULATORY_CARE_PROVIDER_SITE_OTHER): Payer: Medicare Other | Admitting: General Practice

## 2021-08-11 ENCOUNTER — Other Ambulatory Visit: Payer: Self-pay

## 2021-08-11 ENCOUNTER — Encounter (INDEPENDENT_AMBULATORY_CARE_PROVIDER_SITE_OTHER): Payer: Self-pay | Admitting: Family Medicine

## 2021-08-11 ENCOUNTER — Ambulatory Visit (INDEPENDENT_AMBULATORY_CARE_PROVIDER_SITE_OTHER): Payer: Medicare Other | Admitting: Family Medicine

## 2021-08-11 ENCOUNTER — Encounter (HOSPITAL_BASED_OUTPATIENT_CLINIC_OR_DEPARTMENT_OTHER): Payer: Self-pay | Admitting: General Practice

## 2021-08-11 VITALS — BP 149/77 | HR 71 | Temp 98.5°F | Ht 68.0 in | Wt 323.0 lb

## 2021-08-11 VITALS — BP 152/76 | HR 82 | Ht 69.0 in | Wt 328.0 lb

## 2021-08-11 DIAGNOSIS — F3289 Other specified depressive episodes: Secondary | ICD-10-CM | POA: Diagnosis not present

## 2021-08-11 DIAGNOSIS — I5042 Chronic combined systolic (congestive) and diastolic (congestive) heart failure: Secondary | ICD-10-CM

## 2021-08-11 DIAGNOSIS — Z794 Long term (current) use of insulin: Secondary | ICD-10-CM

## 2021-08-11 DIAGNOSIS — E1169 Type 2 diabetes mellitus with other specified complication: Secondary | ICD-10-CM

## 2021-08-11 DIAGNOSIS — E1159 Type 2 diabetes mellitus with other circulatory complications: Secondary | ICD-10-CM

## 2021-08-11 DIAGNOSIS — R5383 Other fatigue: Secondary | ICD-10-CM

## 2021-08-11 DIAGNOSIS — R0602 Shortness of breath: Secondary | ICD-10-CM

## 2021-08-11 DIAGNOSIS — I25119 Atherosclerotic heart disease of native coronary artery with unspecified angina pectoris: Secondary | ICD-10-CM | POA: Diagnosis not present

## 2021-08-11 DIAGNOSIS — E1122 Type 2 diabetes mellitus with diabetic chronic kidney disease: Secondary | ICD-10-CM | POA: Diagnosis not present

## 2021-08-11 DIAGNOSIS — E782 Mixed hyperlipidemia: Secondary | ICD-10-CM | POA: Diagnosis not present

## 2021-08-11 DIAGNOSIS — Z9989 Dependence on other enabling machines and devices: Secondary | ICD-10-CM

## 2021-08-11 DIAGNOSIS — R6889 Other general symptoms and signs: Secondary | ICD-10-CM | POA: Diagnosis not present

## 2021-08-11 DIAGNOSIS — Z1331 Encounter for screening for depression: Secondary | ICD-10-CM

## 2021-08-11 DIAGNOSIS — E785 Hyperlipidemia, unspecified: Secondary | ICD-10-CM | POA: Diagnosis not present

## 2021-08-11 DIAGNOSIS — I152 Hypertension secondary to endocrine disorders: Secondary | ICD-10-CM | POA: Diagnosis not present

## 2021-08-11 DIAGNOSIS — Z6841 Body Mass Index (BMI) 40.0 and over, adult: Secondary | ICD-10-CM | POA: Diagnosis not present

## 2021-08-11 DIAGNOSIS — G4733 Obstructive sleep apnea (adult) (pediatric): Secondary | ICD-10-CM

## 2021-08-11 DIAGNOSIS — N183 Chronic kidney disease, stage 3 unspecified: Secondary | ICD-10-CM | POA: Diagnosis not present

## 2021-08-11 DIAGNOSIS — I10 Essential (primary) hypertension: Secondary | ICD-10-CM

## 2021-08-11 DIAGNOSIS — E559 Vitamin D deficiency, unspecified: Secondary | ICD-10-CM | POA: Diagnosis not present

## 2021-08-11 DIAGNOSIS — Z9189 Other specified personal risk factors, not elsewhere classified: Secondary | ICD-10-CM

## 2021-08-11 MED ORDER — ISOSORBIDE MONONITRATE ER 60 MG PO TB24: 180.0000 mg | ORAL_TABLET | Freq: Every day | ORAL | 3 refills | Status: DC

## 2021-08-11 NOTE — Patient Instructions (Signed)
Medication Instructions:   INCREASE Isosorbide to 180 mg daily---take three 60 mg tablets daily  *If you need a refill on your cardiac medications before your next appointment, please call your pharmacy*   Testing/Procedures: Your physician has requested that you have an echocardiogram. Echocardiography is a painless test that uses sound waves to create images of your heart. It provides your doctor with information about the size and shape of your heart and how well your heart's chambers and valves are working. This procedure takes approximately one hour. There are no restrictions for this procedure.   Follow-Up: At [facility], you and your health needs are our priority.  As part of our continuing mission to provide you with exceptional heart care, we have created designated Provider Care Teams.  These Care Teams include your primary Cardiologist (physician) and Advanced Practice Providers (APPs -  Physician Assistants and Nurse Practitioners) who all work together to provide you with the care you need, when you need it.  We recommend signing up for the patient portal called "MyChart".  Sign up information is provided on this After Visit Summary.  MyChart is used to connect with patients for Virtual Visits (Telemedicine).  Patients are able to view lab/test results, encounter notes, upcoming appointments, etc.  Non-urgent messages can be sent to your provider as well.   To learn more about what you can do with MyChart, go to [email or web address].    Your next appointment:   1-2 month(s)  The format for your next appointment:   In Person  Provider:   Coletta Memos, FNP   Other Instructions Coletta Memos, FNP has recommended fluid restriction of no more than 64 oz of fluid per day. He is also recommending that you wear compression stockings to help with swelling in your legs and weighing yourself daily. Please call our office at [phone] if you notice weight gain of 2 lbs  overnight or 5 lbs in one week. Please review the information below. Thank you!    PLEASE PURCHASE AND WEAR COMPRESSION STOCKINGS DAILY AND TAKE OFF AT BEDTIME. Compression stockings are elastic socks that squeeze the legs. They help to increase blood flow to the legs and to decrease swelling in the legs from fluid retention, and reduce the chance of developing blood clots in the lower legs. Please put on in the AM when dressing and off at night when dressing for bed.  LET THEM KNOW THAT YOU NEED KNEE HIGH'S WITH COMPRESSION OF 15-20 mmhg.  [employer]  [place] ([place] [phone]); [place], [place] [phone]; [phone]  EMAIL   [email or web address].  PLEASE MAKE SURE TO ELEVATE YOUR FEET & LEGS WHILE SITTING, THIS WILL HELP WITH THE SWELLING ALSO.      Daily Weight Record: It is important to weigh yourself daily. Keep your daily weight chart near your scale. Weigh yourself each morning at the same time. Weigh yourself without shoes, and try to wear the same amount of clothing each day. Compare today's weight to yesterday's weight.  Bring this log with you to your follow-up appointments.

## 2021-08-12 LAB — CBC WITH DIFFERENTIAL/PLATELET
Basophils Absolute: 0 10*3/uL (ref 0.0–0.2)
Basos: 1 %
EOS (ABSOLUTE): 0.2 10*3/uL (ref 0.0–0.4)
Eos: 2 %
Hematocrit: 39.1 % (ref 37.5–51.0)
Hemoglobin: 12.3 g/dL — ABNORMAL LOW (ref 13.0–17.7)
Immature Grans (Abs): 0.1 10*3/uL (ref 0.0–0.1)
Immature Granulocytes: 1 %
Lymphocytes Absolute: 2 10*3/uL (ref 0.7–3.1)
Lymphs: 23 %
MCH: 26.3 pg — ABNORMAL LOW (ref 26.6–33.0)
MCHC: 31.5 g/dL (ref 31.5–35.7)
MCV: 84 fL (ref 79–97)
Monocytes Absolute: 0.9 10*3/uL (ref 0.1–0.9)
Monocytes: 11 %
Neutrophils Absolute: 5.4 10*3/uL (ref 1.4–7.0)
Neutrophils: 62 %
Platelets: 254 10*3/uL (ref 150–450)
RBC: 4.68 x10E6/uL (ref 4.14–5.80)
RDW: 15.9 % — ABNORMAL HIGH (ref 11.6–15.4)
WBC: 8.6 10*3/uL (ref 3.4–10.8)

## 2021-08-12 LAB — LIPID PANEL WITH LDL/HDL RATIO
Cholesterol, Total: 283 mg/dL — ABNORMAL HIGH (ref 100–199)
HDL: 89 mg/dL (ref >39)
LDL Chol Calc (NIH): 169 mg/dL — ABNORMAL HIGH (ref 0–99)
LDL/HDL Ratio: 1.9 ratio (ref 0.0–3.6)
Triglycerides: 145 mg/dL (ref 0–149)
VLDL Cholesterol Cal: 25 mg/dL (ref 5–40)

## 2021-08-12 LAB — COMPREHENSIVE METABOLIC PANEL
ALT: 12 IU/L (ref 0–44)
AST: 16 IU/L (ref 0–40)
Albumin/Globulin Ratio: 1.2 (ref 1.2–2.2)
Albumin: 3.9 g/dL (ref 3.8–4.8)
Alkaline Phosphatase: 183 IU/L — ABNORMAL HIGH (ref 44–121)
BUN/Creatinine Ratio: 16 (ref 10–24)
BUN: 39 mg/dL — ABNORMAL HIGH (ref 8–27)
Bilirubin Total: <0.2 mg/dL (ref 0.0–1.2)
CO2: 23 mmol/L (ref 20–29)
Calcium: 8.9 mg/dL (ref 8.6–10.2)
Chloride: 102 mmol/L (ref 96–106)
Creatinine, Ser: 2.44 mg/dL — ABNORMAL HIGH (ref 0.76–1.27)
Globulin, Total: 3.3 g/dL (ref 1.5–4.5)
Glucose: 196 mg/dL — ABNORMAL HIGH (ref 65–99)
Potassium: 4.1 mmol/L (ref 3.5–5.2)
Sodium: 141 mmol/L (ref 134–144)
Total Protein: 7.2 g/dL (ref 6.0–8.5)
eGFR: 29 mL/min/{1.73_m2} — ABNORMAL LOW (ref >59)

## 2021-08-12 LAB — FOLATE: Folate: 4.5 ng/mL (ref >3.0)

## 2021-08-12 LAB — HEMOGLOBIN A1C
Est. average glucose Bld gHb Est-mCnc: 217 mg/dL
Hgb A1c MFr Bld: 9.2 % — ABNORMAL HIGH (ref 4.8–5.6)

## 2021-08-12 LAB — VITAMIN B12: Vitamin B-12: 491 pg/mL (ref 232–1245)

## 2021-08-12 LAB — VITAMIN D 25 HYDROXY (VIT D DEFICIENCY, FRACTURES): Vit D, 25-Hydroxy: 10.3 ng/mL — ABNORMAL LOW (ref 30.0–100.0)

## 2021-08-12 LAB — T4, FREE: Free T4: 1.18 ng/dL (ref 0.82–1.77)

## 2021-08-12 LAB — T3: T3, Total: 80 ng/dL (ref 71–180)

## 2021-08-12 LAB — TSH: TSH: 2.89 u[IU]/mL (ref 0.450–4.500)

## 2021-08-12 NOTE — Progress Notes (Signed)
Dear Joshua Core, PA-C,   Thank you for referring Joshua [name] to our clinic. The following note includes my evaluation and treatment recommendations.  Chief Complaint:   Joshua [name] (MR# [id number]) is a 65 y.o. male who presents for evaluation and treatment of Joshua and related comorbidities. Current BMI is Body mass index is 49.11 kg/m. Joshua [name] has been struggling with his weight for many years and has been unsuccessful in either losing weight, maintaining weight loss, or reaching his healthy weight goal.  Joshua [name] is currently in the action stage of change and ready to dedicate time achieving and maintaining a healthier weight. Joshua [name] is interested in becoming our patient and working on intensive lifestyle modifications including (but not limited to) diet and exercise for weight loss.  Joshua [name] has been on disability since 2018. He lives with his wife and 3 sons, ages 33-32. He eats out 2 days a week. "Pork things", "cold", "wet", potatoes, gravy, rice. He skips breakfast and lunch. Pt has lost weight in the past by not eating.  Joshua [name]'s habits were reviewed today and are as follows: he struggles with family and or coworkers weight loss sabotage, his desired weight loss is 123 lbs, he has been heavy most of his life, he started gaining weight around age 55, his heaviest weight ever was 334 pounds, he has significant food cravings issues, he snacks frequently in the evenings, he skips meals frequently, he is frequently drinking liquids with calories, he frequently makes poor food choices, he frequently eats larger portions than normal, he has binge eating behaviors, and he struggles with emotional eating.  Depression Screen Joshua [name]'s Food and Mood (modified PHQ-9) score was 20.  Depression screen PHQ 2/9 08/11/2021  Decreased Interest 3  Down, Depressed, Hopeless 2  PHQ - 2 Score 5  Altered sleeping 3  Tired, decreased energy 3  Change in appetite 1  Feeling bad or  failure about yourself  3  Trouble concentrating 2  Moving slowly or fidgety/restless 2  Suicidal thoughts 1  PHQ-9 Score 20  Difficult doing work/chores Somewhat difficult  Some recent data might be hidden   Subjective:   1. Other fatigue Arash admits to daytime somnolence and admits to waking up still tired. Patent has a history of symptoms of daytime fatigue and morning fatigue. Joshua [name] generally gets 6 hours of sleep per night, and states that he has poor sleep quality. Snoring is present. Apneic episodes are present. Epworth Sleepiness Score is 6.  2. Shortness of breath on exertion Joshua [name] notes increasing shortness of breath with exercising and seems to be worsening over time with weight gain. He notes getting out of breath sooner with activity than he used to. This has gotten worse recently. Joshua [name] denies shortness of breath at rest or orthopnea.  3. Type 2 diabetes mellitus with stage 3 chronic kidney disease, with long-term current use of insulin, unspecified whether stage 3a or 3b CKD (HCC) Joshua [name] was first diagnosed 35 years ago. He is treated by Joshua [name]. Diabetes Mellitus: Not at goal. Medication: Lantus, Humalog, Ozempic.   Lab Results  Component Value Date   HGBA1C 9.2 (H) 08/11/2021   HGBA1C 9.1 (A) 06/17/2021   HGBA1C 8.3 (A) 02/10/2021   Lab Results  Component Value Date   LDLCALC 169 (H) 08/11/2021   CREATININE 2.44 (H) 08/11/2021   4. Chronic kidney disease due to type 2 diabetes mellitus [name]) He is managed by Dr. Hollie [name] of Nephrology and last labs were done  6 months ago- creatinine 2.5 and GFR 30+; Per pt, he is stage III CKD. He has an appointment with nephrology on 09/03/2021.  5. Hypertension associated with diabetes ([place]) Not at goal. High normal for pt with diabetes. Medications: Norvasc, Metoprolol, Lasix.   BP Readings from Last 3 Encounters:  08/11/21 (!) 152/76  08/11/21 (!) 149/77  06/17/21 (!) 154/90   Lab Results  Component Value Date    CREATININE 2.44 (H) 08/11/2021   6. Other depression with emotional eating PHQ= 21. Pt denies suicidal or homicidal ideations. Over the years, pt could not tolerate several anti-depressants. He hasn't had a counselor in the past or currently. Pt states he's worked with PCP about this condition.  7. OSA (obstructive sleep apnea)- not using CPAP ESS= 6. [name] has not used CPAP regularly in the past 3-4 months. He is treated by cardiology.   8. Vitamin D deficiency Last year, pt was told he is deficient. Took medication for it and was told he doesn't need to continue.  9. Mixed diabetic hyperlipidemia associated with type 2 diabetes mellitus ([place]) Treated by cardiology. Pt is not on statin therapy.  10. At risk for malnutrition Joshua [name] is at risk for malnutrition due to skipping breakfast and lunch every day.  Assessment/Plan:   Orders Placed This Encounter  Procedures   Vitamin B12   CBC with Differential/Platelet   Comprehensive metabolic panel   Folate   Hemoglobin A1c   Lipid Panel With LDL/HDL Ratio   T3   T4, free   TSH   VITAMIN D 25 Hydroxy (Vit-D Deficiency, Fractures)    Medications Discontinued During This Encounter  Medication Reason   metoprolol succinate (TOPROL XL) 25 MG 24 hr tablet Error   insulin aspart (NOVOLOG) 100 UNIT/ML injection Error     No orders of the defined types were placed in this encounter.    1. Other fatigue Joshua [name] does feel that his weight is causing his energy to be lower than it should be. Fatigue may be related to Joshua, depression or many other causes. Labs will be ordered, and in the meanwhile, Joshua [name] will focus on self care including making healthy food choices, increasing physical activity and focusing on stress reduction. Check labs today.  - Vitamin B12 - Folate - T3 - T4, free - TSH  2. Shortness of breath on exertion Joshua [name] does feel that he gets out of breath more easily that he used to when he exercises. Joshua [name]'s  shortness of breath appears to be Joshua related and exercise induced. He has agreed to work on weight loss and gradually increase exercise to treat his exercise induced shortness of breath. Will continue to monitor closely.  3. Type 2 diabetes mellitus with stage 3 chronic kidney disease, with long-term current use of insulin, unspecified whether stage 3a or 3b CKD ([place]) Issues reviewed: blood sugar goals, complications of diabetes mellitus, hypoglycemia prevention and treatment, exercise, and nutrition. The importance of regular follow up with PCP and all other specialists as scheduled was stressed to patient today. Check labs today.  - CBC with Differential/Platelet - Comprehensive metabolic panel - Hemoglobin A1c  4. Chronic kidney disease due to type 2 diabetes mellitus ([place]) Plan: Check labs. May need protein intake adjustment.  5. Hypertension associated with diabetes ([place]) Decrease salt and focus on prudent nutritional plan and weight loss.  Check labs today.  - CBC with Differential/Platelet - Comprehensive metabolic panel  6. Other depression with emotional eating Pt denies this is an issue  currently and declines counseling. Pt advised to follow up with PCP for this. We will closely monitor.  7. OSA (obstructive sleep apnea)- not using CPAP Pt advised to use CPAP nightly. Education provided.  8. Vitamin D deficiency Check labs today.  - VITAMIN D 25 Hydroxy (Vit-D Deficiency, Fractures)  9. Mixed diabetic hyperlipidemia associated with type 2 diabetes mellitus ([place]) Check labs today. Focus on prudent nutritional plan and decreased sat/trans fats.  - CBC with Differential/Platelet - Comprehensive metabolic panel - Lipid Panel With LDL/HDL Ratio  10. At risk for malnutrition Zelma was given extensive malnutrition prevention education and counseling today of more than 24 minutes.  Counseled him that malnutrition refers to inappropriate nutrients or not the right balance  of nutrients for optimal health.  Discussed with TORY SEPTER that it is absolutely possible to be malnourished but yet obese.  Risk factors, including but not limited to, inappropriate dietary choices, difficulty with obtaining food due to physical or financial limitations, and various physical and mental health conditions were reviewed with Thana Ates.   11. Joshua with current BMI of 49.2  Johnel is currently in the action stage of change and his goal is to continue with weight loss efforts. I recommend Alyjah begin the structured treatment plan as follows:  He has agreed to the Category 4 Plan.  Notified pt, with his CKD, depending on labs, we may need to modify protein intake in the future.  Exercise goals:  As is    Behavioral modification strategies: no skipping meals, meal planning and cooking strategies, keeping healthy foods in the home, and planning for success.  He was informed of the importance of frequent follow-up visits to maximize his success with intensive lifestyle modifications for his multiple health conditions. He was informed we would discuss his lab results at his next visit unless there is a critical issue that needs to be addressed sooner. Anzel agreed to keep his next visit at the agreed upon time to discuss these results.  Objective:   Blood pressure (!) 149/77, pulse 71, temperature 98.5 F (36.9 C), height $RemoveBe'5\' 8"'CKSuFInDB$  (1.727 m), weight (!) 323 lb (146.5 kg), SpO2 98 %. Body mass index is 49.11 kg/m.  EKG: Normal sinus rhythm, rate 90 (on 02/23/2021 at [facility]).  Indirect Calorimeter completed today shows a VO2 of 407 and a REE of 2808.  His calculated basal metabolic rate is 9292 thus his basal metabolic rate is better than expected.  General: Cooperative, alert, well developed, in no acute distress. HEENT: Conjunctivae and lids unremarkable. Cardiovascular: Regular rhythm.  Lungs: Normal work of breathing. Neurologic: No focal deficits.   Lab  Results  Component Value Date   CREATININE 2.44 (H) 08/11/2021   BUN 39 (H) 08/11/2021   NA 141 08/11/2021   K 4.1 08/11/2021   CL 102 08/11/2021   CO2 23 08/11/2021   Lab Results  Component Value Date   ALT 12 08/11/2021   AST 16 08/11/2021   ALKPHOS 183 (H) 08/11/2021   BILITOT <0.2 08/11/2021   Lab Results  Component Value Date   HGBA1C 9.2 (H) 08/11/2021   HGBA1C 9.1 (A) 06/17/2021   HGBA1C 8.3 (A) 02/10/2021   HGBA1C 7.5 (A) 08/15/2020   HGBA1C 8.9 (A) 05/15/2020   No results found for: INSULIN Lab Results  Component Value Date   TSH 2.890 08/11/2021   Lab Results  Component Value Date   CHOL 283 (H) 08/11/2021   HDL 89 08/11/2021   LDLCALC 169 (  H) 08/11/2021   TRIG 145 08/11/2021   CHOLHDL 2.0 08/17/2019   Lab Results  Component Value Date   WBC 8.6 08/11/2021   HGB 12.3 (L) 08/11/2021   HCT 39.1 08/11/2021   MCV 84 08/11/2021   PLT 254 08/11/2021    Attestation Statements:   Reviewed by clinician on day of visit: allergies, medications, problem list, medical history, surgical history, family history, social history, and previous encounter notes.  Coral Ceo, CMA, am acting as transcriptionist for [employer], DO.  I have reviewed the above documentation for accuracy and completeness, and I agree with the above. Marjory Sneddon, D.O.  The [place] was signed into law in 2016 which includes the topic of electronic health records.  This provides immediate access to information in MyChart.  This includes consultation notes, operative notes, office notes, lab results and pathology reports.  If you have any questions about what you read please let us know at your next visit so we can discuss your concerns and take corrective action if need be.  We are right here with you.

## 2021-08-19 ENCOUNTER — Other Ambulatory Visit: Payer: Self-pay

## 2021-08-19 ENCOUNTER — Ambulatory Visit (INDEPENDENT_AMBULATORY_CARE_PROVIDER_SITE_OTHER): Payer: Medicare Other

## 2021-08-19 DIAGNOSIS — I5042 Chronic combined systolic (congestive) and diastolic (congestive) heart failure: Secondary | ICD-10-CM

## 2021-08-19 LAB — ECHOCARDIOGRAM COMPLETE
Area-P 1/2: 3.19 cm2
Calc EF: 50.6 %
S' Lateral: 2.62 cm
Single Plane A2C EF: 51.2 %
Single Plane A4C EF: 49.5 %

## 2021-08-25 ENCOUNTER — Encounter (INDEPENDENT_AMBULATORY_CARE_PROVIDER_SITE_OTHER): Payer: Self-pay | Admitting: Family Medicine

## 2021-08-25 ENCOUNTER — Ambulatory Visit (INDEPENDENT_AMBULATORY_CARE_PROVIDER_SITE_OTHER): Payer: Medicare Other | Admitting: Family Medicine

## 2021-08-25 ENCOUNTER — Other Ambulatory Visit: Payer: Self-pay

## 2021-08-25 VITALS — BP 123/83 | HR 69 | Temp 98.0°F | Ht 69.0 in | Wt 320.0 lb

## 2021-08-25 DIAGNOSIS — I152 Hypertension secondary to endocrine disorders: Secondary | ICD-10-CM

## 2021-08-25 DIAGNOSIS — E785 Hyperlipidemia, unspecified: Secondary | ICD-10-CM | POA: Diagnosis not present

## 2021-08-25 DIAGNOSIS — Z794 Long term (current) use of insulin: Secondary | ICD-10-CM | POA: Diagnosis not present

## 2021-08-25 DIAGNOSIS — E1159 Type 2 diabetes mellitus with other circulatory complications: Secondary | ICD-10-CM | POA: Diagnosis not present

## 2021-08-25 DIAGNOSIS — E1169 Type 2 diabetes mellitus with other specified complication: Secondary | ICD-10-CM

## 2021-08-25 DIAGNOSIS — E559 Vitamin D deficiency, unspecified: Secondary | ICD-10-CM | POA: Diagnosis not present

## 2021-08-25 DIAGNOSIS — Z6841 Body Mass Index (BMI) 40.0 and over, adult: Secondary | ICD-10-CM | POA: Diagnosis not present

## 2021-08-25 DIAGNOSIS — D638 Anemia in other chronic diseases classified elsewhere: Secondary | ICD-10-CM | POA: Diagnosis not present

## 2021-08-25 DIAGNOSIS — E1122 Type 2 diabetes mellitus with diabetic chronic kidney disease: Secondary | ICD-10-CM | POA: Diagnosis not present

## 2021-08-25 MED ORDER — VITAMIN D (ERGOCALCIFEROL) 1.25 MG (50000 UNIT) PO CAPS: 50000.0000 [IU] | ORAL_CAPSULE | ORAL | 0 refills | Status: DC

## 2021-08-26 NOTE — Progress Notes (Signed)
Chief Complaint:   OBESITY Joshua [name] is here to discuss his progress with his obesity treatment plan along with follow-up of his obesity related diagnoses. Joshua [name] is on the Category 4 Plan and states he is following his eating plan approximately 40% of the time. Joshua [name] states he is not currently exercising.  Today's visit was #: 2 Starting weight: 323 lbs Starting date: 08/11/2021 Today's weight: 320 lbs Today's date: 08/25/2021 Total lbs lost to date: 3 Total lbs lost since last in-office visit: 3  Interim History: Joshua [name] is here today for his first follow-up office visit since starting the program with [place].  All blood work/ lab tests that were recently ordered by myself or an outside provider were reviewed with patient today per their request.   Extended time was spent counseling him on all new disease processes that were discovered or preexisting ones that are affected by BMI.  he understands that many of these abnormalities will need to monitored regularly along with the current treatment plan of prudent dietary changes, in which we are making each and every office visit, to improve these health parameters. Joshua [name] is not in the habit of eating this way or measuring yet. He doesn't have a lot of the food yet and is unable to follow plan. He will skip meats and then grab peanut butter or cheese.   Subjective:   1. Type 2 diabetes mellitus with other specified complication, with long-term current use of insulin (Joshua [name]) Discussed labs with patient today. A1c not at goal. Pt never checks blood sugars at home. He is managed by [name] with 2 insulins and Ozempic.  2. Hypertension associated with type 2 diabetes mellitus (Joshua [name]) Discussed labs with patient today. At goal. He is not checking his BP at home. Medication: Lasix, Norvasc, Imdur, and Toprol XL  3. Hyperlipidemia associated with type 2 diabetes mellitus (Joshua [name]) Discussed labs with patient today. Joshua [name] has been off  Crestor 40 mg and Zetia 10 mg for a couple of years now due to elevated liver enzymes. Managed by cardiology.  4. Chronic kidney disease due to type 2 diabetes mellitus (Joshua [name]) Discussed labs with patient today. 2.5 serum creatinine recently at Nephrologist, [name]. It is now 2.44.  5. Vitamin D deficiency Worsening. Discussed labs with patient today. He is currently taking no vitamin D supplement. He denies nausea, vomiting or muscle weakness.  6. Anemia of chronic disease Discussed labs with patient today. Stable Hgb. Joshua [name] is not a vegetarian.  He does not have a history of weight loss surgery.   Assessment/Plan:  No orders of the defined types were placed in this encounter.   There are no discontinued medications.   Meds ordered this encounter  Medications   Vitamin D, Ergocalciferol, (DRISDOL) 1.25 MG (50000 UNIT) CAPS capsule    Sig: Take 1 capsule (50,000 Units total) by mouth every 7 (seven) days.    Dispense:  4 capsule    Refill:  0    30 d supply;  ** OV for RF **   Do not send RF request     1. Type 2 diabetes mellitus with other specified complication, with long-term current use of insulin (Joshua [name]) Good blood sugar control is important to decrease the likelihood of diabetic complications such as nephropathy, neuropathy, limb loss, blindness, coronary artery disease, and death. Intensive lifestyle modification including diet, exercise and weight loss are the first line of treatment for diabetes.  Continue treatment per endocrinology.  2. Hypertension  associated with type 2 diabetes mellitus (Joshua [name]) Joshua [name] is working on healthy weight loss and exercise to improve blood pressure control. We will watch for signs of hypotension as he continues his lifestyle modifications.  3. Hyperlipidemia associated with type 2 diabetes mellitus (Joshua [name]) Decrease saturate and trans fats by following meal plan. Pt will contact cardiology in the near future to inquire about cholesterol meds  and/or lipid clinic.  4. Chronic kidney disease due to type 2 diabetes mellitus (Joshua [name]) Lab results and trends reviewed. We discussed several lifestyle modifications today and he will continue to work on diet, exercise and weight loss efforts. Avoid nephrotoxic medications. Orders and follow up as documented in patient record.   Counseling Chronic kidney disease (CKD) happens when the kidneys are damaged over a long period of time. Most of the time, this condition does not go away, but it can usually be controlled. Steps must be taken to slow down the kidney damage or to stop it from getting worse. Intensive lifestyle modifications are the first line treatment for this issue.  Avoid buying foods that are: processed, frozen, or prepackaged to avoid excess salt.  5. Vitamin D deficiency Low Vitamin D level contributes to fatigue and are associated with obesity, breast, and colon cancer. He agrees to start prescription Vitamin D 50,000 IU every week and will follow-up for routine testing of Vitamin D, at least 2-3 times per year to avoid over-replacement.  Start- Vitamin D, Ergocalciferol, (DRISDOL) 1.25 MG (50000 UNIT) CAPS capsule; Take 1 capsule (50,000 Units total) by mouth every 7 (seven) days.  Dispense: 4 capsule; Refill: 0  6. Anemia of chronic disease Orders and follow up as documented in patient record.  Counseling Iron is essential for our bodies to make red blood cells.  Reasons that someone may be deficient include: an iron-deficient diet (more likely in those following vegan or vegetarian diets), women with heavy menses, patients with GI disorders or poor absorption, patients that have had bariatric surgery, frequent blood donors, patients with cancer, and patients with heart disease.   An iron supplement has been recommended. This is found over-the-counter.   Iron-rich foods include dark leafy greens, red and white meats, eggs, seafood, and beans.   Certain foods and drinks prevent  your body from absorbing iron properly. Avoid eating these foods in the same meal as iron-rich foods or with iron supplements. These foods include: coffee, black tea, and red wine; milk, dairy products, and foods that are high in calcium; beans and soybeans; whole grains.  Constipation can be a side effect of iron supplementation. Increased water and fiber intake are helpful. Water goal: > 2 liters/day. Fiber goal: > 25 grams/day.  7. Obesity with current BMI of 47.3  Joshua [name] is currently in the action stage of change. As such, his goal is to continue with weight loss efforts. He has agreed to change to keeping a food journal and adhering to recommended goals of 1800-2000 calories and no more than 60 grams protein per day.   For future, pt will focus on eating dinner time meal.  Exercise goals:  As is  Behavioral modification strategies: decreasing simple carbohydrates, planning for success, and keeping a strict food journal.  Joshua [name] has agreed to follow-up with our clinic in 2 weeks. He was informed of the importance of frequent follow-up visits to maximize his success with intensive lifestyle modifications for his multiple health conditions.   Objective:   Blood pressure 123/83, pulse 69, temperature 98 F (36.7 C), height  $'5\' 9"'p$  (1.753 m), weight (!) 320 lb (145.2 kg), SpO2 98 %. Body mass index is 47.26 kg/m.  General: Cooperative, alert, well developed, in no acute distress. HEENT: Conjunctivae and lids unremarkable. Cardiovascular: Regular rhythm.  Lungs: Normal work of breathing. Neurologic: No focal deficits.   Lab Results  Component Value Date   CREATININE 2.44 (H) 08/11/2021   BUN 39 (H) 08/11/2021   NA 141 08/11/2021   K 4.1 08/11/2021   CL 102 08/11/2021   CO2 23 08/11/2021   Lab Results  Component Value Date   ALT 12 08/11/2021   AST 16 08/11/2021   ALKPHOS 183 (H) 08/11/2021   BILITOT <0.2 08/11/2021   Lab Results  Component Value Date   HGBA1C 9.2 (H)  08/11/2021   HGBA1C 9.1 (A) 06/17/2021   HGBA1C 8.3 (A) 02/10/2021   HGBA1C 7.5 (A) 08/15/2020   HGBA1C 8.9 (A) 05/15/2020   No results found for: INSULIN Lab Results  Component Value Date   TSH 2.890 08/11/2021   Lab Results  Component Value Date   CHOL 283 (H) 08/11/2021   HDL 89 08/11/2021   LDLCALC 169 (H) 08/11/2021   TRIG 145 08/11/2021   CHOLHDL 2.0 08/17/2019   Lab Results  Component Value Date   VD25OH 10.3 (L) 08/11/2021   VD25OH 15 (L) 10/30/2013   Lab Results  Component Value Date   WBC 8.6 08/11/2021   HGB 12.3 (L) 08/11/2021   HCT 39.1 08/11/2021   MCV 84 08/11/2021   PLT 254 08/11/2021   No results found for: IRON, TIBC, FERRITIN  Obesity Behavioral Intervention:   Approximately 15 minutes were spent on the discussion below.  ASK: We discussed the diagnosis of obesity with Jerson today and Man[place] agreed to give [place] permission to discuss obesity behavioral modification therapy today.  ASSESS: Erik has the diagnosis of obesity and his BMI today is 47.3. Glennis is in the action stage of change.   ADVISE: Jakeim was educated on the multiple health risks of obesity as well as the benefit of weight loss to improve his health. He was advised of the need for long term treatment and the importance of lifestyle modifications to improve his current health and to decrease his risk of future health problems.  AGREE: Multiple dietary modification options and treatment options were discussed and Daleon agreed to follow the recommendations documented in the above note.  ARRANGE: Everrett was educated on the importance of frequent visits to treat obesity as outlined per CMS and USPSTF guidelines and agreed to schedule his next follow up appointment today.  Attestation Statements:   Reviewed by clinician on day of visit: allergies, medications, problem list, medical history, surgical history, family history, social history, and previous encounter notes.  Coral Ceo, CMA, am acting as transcriptionist for [employer], DO.  I have reviewed the above documentation for accuracy and completeness, and I agree with the above. Marjory Sneddon, D.O.  The [place] was signed into law in 2016 which includes the topic of electronic health records.  This provides immediate access to information in MyChart.  This includes consultation notes, operative notes, office notes, lab results and pathology reports.  If you have any questions about what you read please let us know at your next visit so we can discuss your concerns and take corrective action if need be.  We are right here with you.

## 2021-08-31 DIAGNOSIS — I25119 Atherosclerotic heart disease of native coronary artery with unspecified angina pectoris: Secondary | ICD-10-CM | POA: Diagnosis not present

## 2021-08-31 DIAGNOSIS — G4733 Obstructive sleep apnea (adult) (pediatric): Secondary | ICD-10-CM | POA: Diagnosis not present

## 2021-08-31 DIAGNOSIS — E7800 Pure hypercholesterolemia, unspecified: Secondary | ICD-10-CM | POA: Diagnosis not present

## 2021-08-31 DIAGNOSIS — Z23 Encounter for immunization: Secondary | ICD-10-CM | POA: Diagnosis not present

## 2021-08-31 DIAGNOSIS — N184 Chronic kidney disease, stage 4 (severe): Secondary | ICD-10-CM | POA: Diagnosis not present

## 2021-08-31 DIAGNOSIS — G629 Polyneuropathy, unspecified: Secondary | ICD-10-CM | POA: Diagnosis not present

## 2021-08-31 DIAGNOSIS — E113493 Type 2 diabetes mellitus with severe nonproliferative diabetic retinopathy without macular edema, bilateral: Secondary | ICD-10-CM | POA: Diagnosis not present

## 2021-08-31 DIAGNOSIS — I10 Essential (primary) hypertension: Secondary | ICD-10-CM | POA: Diagnosis not present

## 2021-08-31 DIAGNOSIS — Z0000 Encounter for general adult medical examination without abnormal findings: Secondary | ICD-10-CM | POA: Diagnosis not present

## 2021-09-03 DIAGNOSIS — N184 Chronic kidney disease, stage 4 (severe): Secondary | ICD-10-CM | POA: Diagnosis not present

## 2021-09-03 DIAGNOSIS — G4733 Obstructive sleep apnea (adult) (pediatric): Secondary | ICD-10-CM | POA: Diagnosis not present

## 2021-09-03 DIAGNOSIS — I5032 Chronic diastolic (congestive) heart failure: Secondary | ICD-10-CM | POA: Diagnosis not present

## 2021-09-03 DIAGNOSIS — I129 Hypertensive chronic kidney disease with stage 1 through stage 4 chronic kidney disease, or unspecified chronic kidney disease: Secondary | ICD-10-CM | POA: Diagnosis not present

## 2021-09-08 ENCOUNTER — Ambulatory Visit (INDEPENDENT_AMBULATORY_CARE_PROVIDER_SITE_OTHER): Payer: Medicare Other | Admitting: Family Medicine

## 2021-09-08 ENCOUNTER — Encounter (INDEPENDENT_AMBULATORY_CARE_PROVIDER_SITE_OTHER): Payer: Self-pay | Admitting: Family Medicine

## 2021-09-08 ENCOUNTER — Other Ambulatory Visit: Payer: Self-pay

## 2021-09-08 VITALS — BP 131/78 | HR 80 | Temp 98.1°F | Ht 69.0 in | Wt 313.0 lb

## 2021-09-08 DIAGNOSIS — E559 Vitamin D deficiency, unspecified: Secondary | ICD-10-CM

## 2021-09-08 DIAGNOSIS — Z6841 Body Mass Index (BMI) 40.0 and over, adult: Secondary | ICD-10-CM | POA: Diagnosis not present

## 2021-09-08 DIAGNOSIS — E1122 Type 2 diabetes mellitus with diabetic chronic kidney disease: Secondary | ICD-10-CM | POA: Diagnosis not present

## 2021-09-08 MED ORDER — VITAMIN D (ERGOCALCIFEROL) 1.25 MG (50000 UNIT) PO CAPS: 50000.0000 [IU] | ORAL_CAPSULE | ORAL | 0 refills | Status: DC

## 2021-09-08 NOTE — Progress Notes (Signed)
Chief Complaint:   OBESITY Joshua [name] is here to discuss his progress with his obesity treatment plan along with follow-up of his obesity related diagnoses. Joshua [name] is on keeping a food journal and adhering to recommended goals of 1800-2000 calories and 60 grams protein and states he is following his eating plan approximately 30% of the time. Joshua [name] states he is not currently exercising.  Today's visit was #: 3 Starting weight: 323 lbs Starting date: 08/11/2021 Today's weight: 313 lbs Today's date: 09/08/2021 Total lbs lost to date: 10 Total lbs lost since last in-office visit: 7  Interim History: Joshua [name] did not journal anything over the past several weeks. He says he hasn't been able to download any apps to journal his foods. We reviewed the apps again today and I showed him how to use it and how to input foods.  Subjective:   1. Chronic kidney disease due to type 2 diabetes mellitus (HCC) Steadman was recently seen by nephrologist who told pt to double his Lasix. No other changes were made.  2. Vitamin D deficiency MATHHEW [name] is tolerating medication(s) well without side effects.  Medication compliance is good and patient appears to be taking it as prescribed.  Denies additional concerns regarding this condition.   Assessment/Plan:  No orders of the defined types were placed in this encounter.   Medications Discontinued During This Encounter  Medication Reason   Vitamin D, Ergocalciferol, (DRISDOL) 1.25 MG (50000 UNIT) CAPS capsule Reorder     Meds ordered this encounter  Medications   Vitamin D, Ergocalciferol, (DRISDOL) 1.25 MG (50000 UNIT) CAPS capsule    Sig: Take 1 capsule (50,000 Units total) by mouth every 7 (seven) days.    Dispense:  4 capsule    Refill:  0    30 d supply;  ** OV for RF **   Do not send RF request     1. Chronic kidney disease due to type 2 diabetes mellitus ([place]) Pt advised to follow recommendation of renal doctor. Follow up with [name]  in the next week or 2. Pt will review his nutrition with her. Lab results and trends reviewed. We discussed several lifestyle modifications today and he will continue to work on diet, exercise and weight loss efforts. Avoid nephrotoxic medications. Orders and follow up as documented in patient record.   Counseling Chronic kidney disease (CKD) happens when the kidneys are damaged over a long period of time. Most of the time, this condition does not go away, but it can usually be controlled. Steps must be taken to slow down the kidney damage or to stop it from getting worse. Intensive lifestyle modifications are the first line treatment for this issue.  Avoid buying foods that are: processed, frozen, or prepackaged to avoid excess salt.  2. Vitamin D deficiency Low Vitamin D level contributes to fatigue and are associated with obesity, breast, and colon cancer. He agrees to continue to take prescription Vitamin D 50,000 IU every week and will follow-up for routine testing of Vitamin D, at least 2-3 times per year to avoid over-replacement.  Refill- Vitamin D, Ergocalciferol, (DRISDOL) 1.25 MG (50000 UNIT) CAPS capsule; Take 1 capsule (50,000 Units total) by mouth every 7 (seven) days.  Dispense: 4 capsule; Refill: 0  3. Obesity with current BMI of 46.3  Joshua [name] is currently in the action stage of change. As such, his goal is to continue with weight loss efforts. He has agreed to keeping a food journal and adhering  to recommended goals of 1800-2000 calories and 60 grams protein.   Exercise goals:  As is  Behavioral modification strategies: keeping healthy foods in the home and keeping a strict food journal.  Joshua [name] has agreed to follow-up with our clinic in 2 weeks. He was informed of the importance of frequent follow-up visits to maximize his success with intensive lifestyle modifications for his multiple health conditions.    Objective:   Blood pressure 131/78, pulse 80, temperature 98.1 F  (36.7 C), height $RemoveBe'5\' 9"'XkNjRXFfx$  (1.753 m), weight (!) 313 lb (142 kg), SpO2 97 %. Body mass index is 46.22 kg/m.  General: Cooperative, alert, well developed, in no acute distress. HEENT: Conjunctivae and lids unremarkable. Cardiovascular: Regular rhythm.  Lungs: Normal work of breathing. Neurologic: No focal deficits.   Lab Results  Component Value Date   CREATININE 2.44 (H) 08/11/2021   BUN 39 (H) 08/11/2021   NA 141 08/11/2021   K 4.1 08/11/2021   CL 102 08/11/2021   CO2 23 08/11/2021   Lab Results  Component Value Date   ALT 12 08/11/2021   AST 16 08/11/2021   ALKPHOS 183 (H) 08/11/2021   BILITOT <0.2 08/11/2021   Lab Results  Component Value Date   HGBA1C 9.2 (H) 08/11/2021   HGBA1C 9.1 (A) 06/17/2021   HGBA1C 8.3 (A) 02/10/2021   HGBA1C 7.5 (A) 08/15/2020   HGBA1C 8.9 (A) 05/15/2020   No results found for: INSULIN Lab Results  Component Value Date   TSH 2.890 08/11/2021   Lab Results  Component Value Date   CHOL 283 (H) 08/11/2021   HDL 89 08/11/2021   LDLCALC 169 (H) 08/11/2021   TRIG 145 08/11/2021   CHOLHDL 2.0 08/17/2019   Lab Results  Component Value Date   VD25OH 10.3 (L) 08/11/2021   VD25OH 15 (L) 10/30/2013   Lab Results  Component Value Date   WBC 8.6 08/11/2021   HGB 12.3 (L) 08/11/2021   HCT 39.1 08/11/2021   MCV 84 08/11/2021   PLT 254 08/11/2021   No results found for: IRON, TIBC, FERRITIN  Obesity Behavioral Intervention:   Approximately 15 minutes were spent on the discussion below.  ASK: We discussed the diagnosis of obesity with Joshua [name] today and Joshua [name] agreed to give [place] permission to discuss obesity behavioral modification therapy today.  ASSESS: Joshua [name] has the diagnosis of obesity and his BMI today is 46.3. Joshua [name] is in the action stage of change.   ADVISE: Joshua [name] was educated on the multiple health risks of obesity as well as the benefit of weight loss to improve his health. He was advised of the need for long term treatment  and the importance of lifestyle modifications to improve his current health and to decrease his risk of future health problems.  AGREE: Multiple dietary modification options and treatment options were discussed and Ej agreed to follow the recommendations documented in the above note.  ARRANGE: Miliano was educated on the importance of frequent visits to treat obesity as outlined per CMS and USPSTF guidelines and agreed to schedule his next follow up appointment today.  Attestation Statements:   Reviewed by clinician on day of visit: allergies, medications, problem list, medical history, surgical history, family history, social history, and previous encounter notes.  Coral Ceo, CMA, am acting as transcriptionist for [employer], DO.  I have reviewed the above documentation for accuracy and completeness, and I agree with the above. Marjory Sneddon, D.O.  The 21st Century Cures Act was signed  into law in 2016 which includes the topic of electronic health records.  This provides immediate access to information in MyChart.  This includes consultation notes, operative notes, office notes, lab results and pathology reports.  If you have any questions about what you read please let us know at your next visit so we can discuss your concerns and take corrective action if need be.  We are right here with you.

## 2021-09-14 NOTE — Progress Notes (Signed)
Cardiology Office Note:    Date:  09/14/2021   ID:  Joshua [name], DOB [birth year], MRN [id number]  PCP:  Elias Else, MD   [facility] Providers Cardiologist:  Nicki Guadalajara, MD     Referring MD: Elias Else, MD   Follow-up for CAD and chronic combined systolic and diastolic CHF  History of Present Illness:    Joshua [name] is a 65 y.o. male with a hx of coronary artery disease status post PCI with DES to his LAD and RCA in 2012.  He underwent subsequent PCI with DES to LAD and circumflex in 2017 and PCI to LAD for ISR 4/20, PCI with DES to, PCI with DES to P LAD 9/20, combined chronic CHF, HTN, HLD, type 2 diabetes, status post right metatarsal amputation in 2017, osteomyelitis, GERD, CKD stage IV, obstructive sleep apnea, and obesity.   He was seen and evaluated by Dr. Tresa Endo 3/21.  At that time he was doing well from a cardiac standpoint.  He denied shortness of breath and chest discomfort.  His echocardiogram 4/20 showed an LVEF of 55-60% with intermediate diastolic dysfunction, suboptimal acoustic windows limited evaluation for wall motion abnormalities, and no significant valvular abnormalities.   He was seen by Judy Pimple, PA-C on 08/26/2020.  During that time he continued to do well from a cardiac standpoint.  He continued to have DOE however, it had improved since his previous cardiology visit.  He denied chest pain.  He reported that he had been out of Imdur for couple months with no issues and had restarted 120 mg daily.  He had lost around 30 pounds with dietary changes.  Who was congratulated on his weight loss.  He did note some dizziness/lightheadedness with waking up in the morning.  He also noted that he had been struggling somewhat with his CPAP mask.  It had been leaking and he was therefore not as compliant with his CPAP over the prior 2 months.   He was seen by Dr. Tresa Endo 02/23/2021.  During that time he continued to deny chest discomfort.  He was meeting his  compliance standards for his CPAP use and averaging 6 hours and 8 minutes of use per night.  He had received a new CPAP mask which had helped with fitting his face and leaks.   He presented to the clinic 08/11/2021 for follow-up evaluation stated over the past several days he had noticed left chest discomfort.  He had also noticed increased work of breathing with increased physical activity.  He reported that he had taken nitroglycerin 3 times over the past 3 weeks.  He reported that his nitroglycerin was old and that it took about 30 minutes for the medication to work.  His chest discomfort on all occasions came on at rest.  He did not notice chest discomfort with increased exertion.  He did not take his medications on the day of the visit.  His blood pressure was 152/76.  He reported that when he took his amlodipine Imdur, and metoprolol his blood pressure ran in the 130s systolic.  Nephrology had been dosing his furosemide.  We reviewed the importance of low-sodium diet and fluid restriction.  Due to his CHF I will ordered an echocardiogram, give him a fluid restriction of no more than 64 ounces daily,  recommended that he chew sugar-free gum or sugar-free candy, I increased his Imdur to 180 mg daily, asked him to weigh himself daily, and follow-up after his echocardiogram.  His chest  discomfort was reproducible with deep palpation on exam.  His echocardiogram 08/19/2021 showed normal LVEF and G2 DD with no significant changes from previous exam/study.  He presents to the clinic today for follow-up evaluation and states he continues to drink excessive amounts of fluid.  He reports cravings for things such as tea and very cold drinks.  Since he was 65 years old he has had what he describes as increased amount of thirst.  [name] prescribed increased furosemide x1 week after he was seen in cardiology.  He did lose a significant amount of weight.  He reports that he does urinate increased amount with increased  diuretic medication.  He has not been weighing daily.  He reports some dietary indiscretion.  We discussed the importance of low-sodium diet and weight log.  I have instructed him to contact the office with a weight increase of 3 pounds overnight or 5 pounds in 1 week.  We will increase his furosemide to 160 mg twice daily x1 week and have him repeat his BMP in 1 week.  I gave him the salty 6 diet sheet, a weight log, and a [place] support stocking sheet.  We will have him follow-up in 1 month.   Today he denies chest pain, increased shortness of breath,  fatigue, palpitations, melena, hematuria, hemoptysis, diaphoresis, weakness, presyncope, syncope, orthopnea, and PND.  Past Medical History:  Diagnosis Date   Acute coronary syndrome (HCC)    Anemia    B12 deficiency    Chest pain    CHF (congestive heart failure) (HCC)    Chronic combined systolic and diastolic CHF (congestive heart failure) ([place])    a. 07/2015 Echo: EF 45-50%, mod LVH, mid apical/antsept AK, Gr 1 DD.   Chronic low back pain    CKD (chronic kidney disease), stage III (HCC)    Constipation    COPD (chronic obstructive pulmonary disease) (HCC)    Coronary artery disease    a. 2012 s/p MI/PCI: s/p DES to mid/dist RCA and overlapping DES to LAD;  b. 07/2015 Cath: LAD 10% ISR, D1 40(jailed), LCX 76m, OM2 30, OM3 30, RCA 6m ISR, 10d ISR; c. 07/2016 NSTEMI/PCI: LAD 85ost/p/m (3.5x20 Synergy DES overlapping prior stent), D1 40, LCX 59m, OM2 95 (2.75x16 Synergy DES), OM3 30, RCA 22m, 10d.   Depression    Diabetic gastroparesis ([place])    Diabetic polyneuropathy ([place])    DKA (diabetic ketoacidoses) 03/14/2018   GERD (gastroesophageal reflux disease)    Gout    Heart failure (HCC)    Heart murmur    History of MI (myocardial infarction)    Hyperlipemia    Hypertension    Hypertensive heart disease    Hypokalemia    Ischemic cardiomyopathy    a. 07/2015 Echo: EF 45-50%, mod LVH, mid-apicalanteroseptal AK, Gr1 DD.   Joint pain     Kidney problem    Morbid obesity ([place])    OSA on CPAP    Osteoarthritis    Osteomyelitis ([place])    a. 07/2016 MTP joint of R great toe s/p transmetatarsal amputation.   Other fatigue    Polyneuropathy in diabetes(357.2)    Pulmonary sarcoidosis ([place])    "problems w/it years ago; no problems anymore" (07/03/2014)   Rectal bleeding    Shortness of breath    Shortness of breath on exertion    Sleep apnea    Stomach ulcer    Swallowing difficulty    Type II diabetes mellitus ([place])     Past  Surgical History:  Procedure Laterality Date   AMPUTATION Right 07/24/2016   Procedure: TRANSMETATARSAL FOOT AMPUTATION;  Surgeon: [name], MD;  Location: [place];  Service: Orthopedics;  Laterality: Right;   BALLOON DILATION N/A 03/21/2018   Procedure: BALLOON DILATION;  Surgeon: [name], MD;  Location: [facility] ENDOSCOPY;  Service: Endoscopy;  Laterality: N/A;   BREAST LUMPECTOMY Right ~ 2000   "benign"   CARDIAC CATHETERIZATION N/A 07/30/2015   Procedure: Left Heart Cath and Coronary Angiography;  Surgeon: Burnell Blanks, MD;  Location: [place] [facility];  Service: Cardiovascular;  Laterality: N/A;   CARDIAC CATHETERIZATION N/A 07/19/2016   Procedure: Left Heart Cath and Coronary Angiography;  Surgeon: Belva Crome, MD;  Location: [place] [facility];  Service: Cardiovascular;  Laterality: N/A;   CARDIAC CATHETERIZATION N/A 07/29/2016   Procedure: Coronary Stent Intervention;  Surgeon: Belva Crome, MD;  Location: [place] [facility];  Service: Cardiovascular;  Laterality: N/A;   CARDIAC CATHETERIZATION N/A 07/29/2016   Procedure: Intravascular Ultrasound/IVUS;  Surgeon: Belva Crome, MD;  Location: [place] [facility];  Service: Cardiovascular;  Laterality: N/A;   CATARACT EXTRACTION W/ INTRAOCULAR LENS  IMPLANT, BILATERAL Bilateral 2014-2015   COLONOSCOPY WITH PROPOFOL N/A 08/22/2017   Procedure: COLONOSCOPY WITH PROPOFOL;  Surgeon: [name], MD;  Location: [place];  Service:  Endoscopy;  Laterality: N/A;   CORONARY ANGIOPLASTY WITH STENT PLACEMENT  10/31/2011   30 % MID ATRIOVENTRICULAR GROOVE CX STENOSIS. 90 % MID RCA.PTA TO LAD/STENTING OF TANDEM 60-70%. LAD STENOSIS 99% REDUCED TO 0%.3.0 X 32MM PROMUS DES POSTDILATED TO 3.25MM.   CORONARY ANGIOPLASTY WITH STENT PLACEMENT  07/03/2014   "2"   CORONARY STENT INTERVENTION N/A 04/09/2019   Procedure: CORONARY STENT INTERVENTION;  Surgeon: Jettie Booze, MD;  Location: [place] [facility];  Service: Cardiovascular;  Laterality: N/A;   CORONARY STENT INTERVENTION N/A 08/22/2019   Procedure: CORONARY STENT INTERVENTION;  Surgeon: Martinique, Peter M, MD;  Location: [place] [facility];  Service: Cardiovascular;  Laterality: N/A;   ESOPHAGOGASTRODUODENOSCOPY (EGD) WITH PROPOFOL N/A 03/21/2018   Procedure: ESOPHAGOGASTRODUODENOSCOPY (EGD) WITH PROPOFOL;  Surgeon: [name], MD;  Location: [place];  Service: Endoscopy;  Laterality: N/A;   I & D EXTREMITY Right 10/30/2013   Procedure: IRRIGATION AND DEBRIDEMENT RIGHT SMALL FINGER POSSIBLE SECONDARY WOUND CLOSURE;  Surgeon: [name], MD;  Location: [facility];  Service: Orthopedics;  Laterality: Right;  [name] is available after 4pm   I & D EXTREMITY Right 11/01/2013   Procedure:  IRRIGATION AND DEBRIDEMENT RIGHT  PROXIMAL PHALANGEAL LEVEL AMPUTATION;  Surgeon: [name], MD;  Location: [facility];  Service: Orthopedics;  Laterality: Right;   INCISION AND DRAINAGE Right 10/28/2013   Procedure: INCISION AND DRAINAGE Right small finger;  Surgeon: [name], MD;  Location: [facility];  Service: Orthopedics;  Laterality: Right;   LEFT HEART CATH Left 11/03/2011   Procedure: LEFT HEART CATH;  Surgeon: [name], MD;  Location: [facility] CATH LAB;  Service: Cardiovascular;  Laterality: Left;   LEFT HEART CATH AND CORONARY ANGIOGRAPHY N/A 04/09/2019   Procedure: LEFT HEART CATH AND CORONARY ANGIOGRAPHY;  Surgeon: Jettie Booze, MD;  Location: [place] [facility];  Service: Cardiovascular;  Laterality: N/A;   LEFT HEART CATHETERIZATION WITH CORONARY ANGIOGRAM N/A 07/03/2014   Procedure: LEFT HEART CATHETERIZATION WITH CORONARY ANGIOGRAM;  Surgeon: [name], MD;  Location: [facility] CATH LAB;  Service: Cardiovascular;  Laterality: N/A;   PERCUTANEOUS CORONARY  STENT INTERVENTION (PCI-S) Left 11/03/2011   Procedure: PERCUTANEOUS CORONARY STENT INTERVENTION (PCI-S);  Surgeon: [name], MD;  Location: [facility] CATH LAB;  Service: Cardiovascular;  Laterality: Left;   RIGHT/LEFT HEART CATH AND CORONARY ANGIOGRAPHY N/A 08/22/2019   Procedure: RIGHT/LEFT HEART CATH AND CORONARY ANGIOGRAPHY;  Surgeon: Martinique, Peter M, MD;  Location: [place] [facility];  Service: Cardiovascular;  Laterality: N/A;   TOE AMPUTATION Right ~ 2010   "2nd toe"   TOE AMPUTATION Right 2012   "3rd toe"    Current Medications: No outpatient medications have been marked as taking for the 09/16/21 encounter (Appointment) with Deberah Pelton, NP.     Allergies:   Chlorthalidone   Social History   Socioeconomic History   Marital status: Married    Spouse name: Dorian Pod   Number of children: 3   Years of education: Not on file   Highest education level: Not on file  Occupational History    Comment: disabled  Tobacco Use   Smoking status: Never   Smokeless tobacco: Never  Vaping Use   Vaping Use: Never used  Substance and Sexual Activity   Alcohol use: Yes    Comment: social    Drug use: No   Sexual activity: Yes    Partners: Female  Other Topics Concern   Not on file  Social History Narrative   Not on file   Social Determinants of Health   Financial Resource Strain: Not on file  Food Insecurity: Not on file  Transportation Needs: Not on file  Physical Activity: Not on file  Stress: Not on file  Social Connections: Not on file     Family History: The patient's family history includes Diabetes in his maternal grandmother; Heart attack in his maternal aunt;  Hypertension in his maternal grandmother; Sudden death in his mother and another family member.  ROS:   Please see the history of present illness.     All other systems reviewed and are negative.   Risk Assessment/Calculations:           Physical Exam:    VS:  There were no vitals taken for this visit.    Wt Readings from Last 3 Encounters:  09/08/21 (!) 313 lb (142 kg)  08/25/21 (!) 320 lb (145.2 kg)  08/11/21 (!) 328 lb (148.8 kg)     GEN:  Well nourished, well developed in no acute distress HEENT: Normal NECK: No JVD; No carotid bruits LYMPHATICS: No lymphadenopathy CARDIAC: RRR, no murmurs, rubs, gallops RESPIRATORY:  Clear to auscultation without rales, wheezing or rhonchi  ABDOMEN: Soft, non-tender, non-distended MUSCULOSKELETAL:  No edema; No deformity  SKIN: Warm and dry NEUROLOGIC:  Alert and oriented x 3 PSYCHIATRIC:  Normal affect    EKGs/Labs/Other Studies Reviewed:    The following studies were reviewed today:  Echocardiogram 04/07/2019   IMPRESSIONS     1. The left ventricle has normal systolic function, with an ejection  fraction of 55-60%. The cavity size was normal. Indeterminate diastolic  filling due to E-A fusion.   2. LV regional wall motion abnormalities could not be completely assessed  due to suboptimal acoustic windows. Grossly normal wall motion.   3. The inferior vena cava was dilated in size with <50% respiratory  variability.   FINDINGS   Left Ventricle: The left ventricle has normal systolic function, with an  ejection fraction of 55-60%. The cavity size was normal. There is no  increase in left ventricular wall thickness. Indeterminate diastolic  filling  due to E-A fusion.      Right Ventricle: The right ventricle has normal systolic function. The  cavity was normal. There is right vetricular wall thickness was not  assessed.   Left Atrium: Left atrial size was not well visualized.   Right Atrium: Right atrial size was not  well visualized. Right atrial  pressure is estimated at 15 mmHg.   Interatrial Septum: The interatrial septum was not well visualized.   Pericardium: There is no evidence of pericardial effusion.   Mitral Valve: The mitral valve is normal in structure. Mitral valve  regurgitation is not visualized by color flow Doppler.   Tricuspid Valve: Tricuspid valve regurgitation was not visualized by color  flow Doppler.   Aortic Valve: The aortic valve is normal in structure. Aortic valve  regurgitation was not visualized by color flow Doppler.   Pulmonic Valve: The pulmonic valve was grossly normal. Pulmonic valve  regurgitation is trivial by color flow Doppler.   Venous: The inferior vena cava is dilated in size with less than 50%  respiratory variability.   Echocardiogram 08/19/21 IMPRESSIONS     1. Left ventricular ejection fraction, by estimation, is 55 to 60%. The  left ventricle has low normal function. Left ventricular endocardial  border not optimally defined to evaluate regional wall motion. There is  mild concentric left ventricular  hypertrophy. Left ventricular diastolic parameters are consistent with  Grade II diastolic dysfunction (pseudonormalization).   2. Right ventricular systolic function is normal. The right ventricular  size is normal. Tricuspid regurgitation signal is inadequate for assessing  PA pressure.   3. Left atrial size was mild to moderately dilated.   4. The mitral valve is normal in structure. No evidence of mitral valve  regurgitation. No evidence of mitral stenosis.   5. The aortic valve was not well visualized. Aortic valve regurgitation  is not visualized. No aortic stenosis is present.   6. The inferior vena cava is normal in size with greater than 50%  respiratory variability, suggesting right atrial pressure of 3 mmHg.   Comparison(s): No significant change from prior study. Prior images  reviewed side by side.  EKG:  EKG is not ordered  today.    EKG 09/11/2021 Normal sinus rhythm no ST or T wave deviation 82 bpm  Recent Labs: 09/26/2020: Magnesium 2.3 08/11/2021: ALT 12; BUN 39; Creatinine, Ser 2.44; Hemoglobin 12.3; Platelets 254; Potassium 4.1; Sodium 141; TSH 2.890  Recent Lipid Panel    Component Value Date/Time   CHOL 283 (H) 08/11/2021 0929   TRIG 145 08/11/2021 0929   HDL 89 08/11/2021 0929   CHOLHDL 2.0 08/17/2019 0402   VLDL 12 08/17/2019 0402   LDLCALC 169 (H) 08/11/2021 0929    ASSESSMENT & PLAN    Chronic combined systolic and diastolic YQI-+3-4 bilateral pitting lower extremity edema to the knee.  Continues with chronic dyspnea, noted with increased physical activity.  Follow-up echocardiogram  showed normal LVEF and G2 DD. Continue metoprolol, hydralazine,  Increase furosemide to 160 mg twice daily x1 week then resume normal dosing Heart healthy low-sodium diet-salty 6 given Increase physical activity as tolerated Lower extremity support stockings-[place] support stocking sheet given Daily weights-contact office with a weight increase of 3 pounds overnight or 5 pounds in 1 week Maintain weight log Maintain fluid restriction less than 64 ounces daily  Coronary artery disease-denies chest pain today.   Cardiac catheterization 4/20 showed widely patent LAD stent, 50% mid circumflex lesion, 40% proximal-mid RCA stenosis, ostial LAD-proximal LAD  90% stenosis.  He received scoring balloon angioplasty.  Proximal LAD 90% stenosed. Continue amlodipine, metoprolol aspirin, Brilinta Continue Imdur 180 daily Heart healthy low-sodium diet-salty 6 given Maintain physical activity as tolerated   Hyperlipidemia-08/11/2021: Cholesterol, Total 283; HDL 89; LDL Chol Calc (NIH) 169; Triglycerides 145 Continue aspirin,  Continue rosuvastatin-recently restarted Heart healthy low-sodium high-fiber diet Increase physical activity as tolerated   Essential hypertension-BP today 144/93.  Reports blood pressure in the  130s over 70s when he is compliant with his medications.  He is working with the weight loss clinic.  Continue metoprolol, Imdur,  Hold amlodipine for 1 week then resume normal dosing. Heart healthy low-sodium diet-salty 6 given Increase physical activity as tolerated Maintain blood pressure log   OSA-continues to report compliance with CPAP.  Reports having minimal leakage with new facemask. Continue CPAP use   Disposition: Follow-up with Dr. Claiborne Billings or me in 1 month       Medication Adjustments/Labs and Tests Ordered: Current medicines are reviewed at length with the patient today.  Concerns regarding medicines are outlined above.  No orders of the defined types were placed in this encounter.  No orders of the defined types were placed in this encounter.   There are no Patient Instructions on file for this visit.   Signed, Deberah Pelton, NP  09/14/2021 6:41 AM      Notice: This dictation was prepared with Dragon dictation along with smaller phrase technology. Any transcriptional errors that result from this process are unintentional and may not be corrected upon review.  I spent 14 minutes examining this patient, reviewing medications, and using patient centered shared decision making involving her cardiac care.  Prior to her visit I spent greater than 20 minutes reviewing her past medical history,  medications, and prior cardiac tests.

## 2021-09-16 ENCOUNTER — Other Ambulatory Visit: Payer: Self-pay

## 2021-09-16 ENCOUNTER — Ambulatory Visit (INDEPENDENT_AMBULATORY_CARE_PROVIDER_SITE_OTHER): Payer: Medicare Other | Admitting: General Practice

## 2021-09-16 ENCOUNTER — Encounter (HOSPITAL_BASED_OUTPATIENT_CLINIC_OR_DEPARTMENT_OTHER): Payer: Self-pay | Admitting: General Practice

## 2021-09-16 VITALS — BP 144/65 | HR 92 | Ht 69.0 in | Wt 321.0 lb

## 2021-09-16 DIAGNOSIS — G4733 Obstructive sleep apnea (adult) (pediatric): Secondary | ICD-10-CM | POA: Diagnosis not present

## 2021-09-16 DIAGNOSIS — I10 Essential (primary) hypertension: Secondary | ICD-10-CM

## 2021-09-16 DIAGNOSIS — I25119 Atherosclerotic heart disease of native coronary artery with unspecified angina pectoris: Secondary | ICD-10-CM

## 2021-09-16 DIAGNOSIS — Z9989 Dependence on other enabling machines and devices: Secondary | ICD-10-CM

## 2021-09-16 DIAGNOSIS — E785 Hyperlipidemia, unspecified: Secondary | ICD-10-CM | POA: Diagnosis not present

## 2021-09-16 DIAGNOSIS — I5042 Chronic combined systolic (congestive) and diastolic (congestive) heart failure: Secondary | ICD-10-CM

## 2021-09-16 MED ORDER — FUROSEMIDE 80 MG PO TABS: ORAL_TABLET | ORAL | 1 refills | Status: DC

## 2021-09-16 NOTE — Patient Instructions (Signed)
Medication Instructions:   INCREASE Lasix (Furosemide) to 160 mg (two 80 mg tablets) twice daily for 1 week. Then, go back to 80 mg twice daily.  HOLD Amlodipine for 1 week. Restart after you finish increased dose of Lasix.  *If you need a refill on your cardiac medications before your next appointment, please call your pharmacy*   Lab Work: Your physician recommends that you return for lab work in 1 week: BMET  If you have labs (blood work) drawn today and your tests are completely normal, you will receive your results only by: [place] (if you have MyChart) OR A paper copy in the mail If you have any lab test that is abnormal or we need to change your treatment, we will call you to review the results.  Follow-Up: At [facility], you and your health needs are our priority.  As part of our continuing mission to provide you with exceptional heart care, we have created designated Provider Care Teams.  These Care Teams include your primary Cardiologist (physician) and Advanced Practice Providers (APPs -  Physician Assistants and Nurse Practitioners) who all work together to provide you with the care you need, when you need it.  We recommend signing up for the patient portal called "MyChart".  Sign up information is provided on this After Visit Summary.  MyChart is used to connect with patients for Virtual Visits (Telemedicine).  Patients are able to view lab/test results, encounter notes, upcoming appointments, etc.  Non-urgent messages can be sent to your provider as well.   To learn more about what you can do with MyChart, go to [email or web address].    Your next appointment:   1 month(s)  The format for your next appointment:   In Person  Provider:   Coletta Memos, NP   Other Instructions Denyse Amass, NP has recommended weighing yourself every day and keeping a log. He has also recommended getting some compression hose.

## 2021-09-21 ENCOUNTER — Encounter: Payer: Self-pay | Admitting: Dietician

## 2021-09-21 ENCOUNTER — Encounter: Payer: Medicare Other | Attending: Internal Medicine | Admitting: Dietician

## 2021-09-21 ENCOUNTER — Other Ambulatory Visit: Payer: Self-pay

## 2021-09-21 DIAGNOSIS — E1142 Type 2 diabetes mellitus with diabetic polyneuropathy: Secondary | ICD-10-CM | POA: Insufficient documentation

## 2021-09-21 DIAGNOSIS — N1832 Chronic kidney disease, stage 3b: Secondary | ICD-10-CM | POA: Insufficient documentation

## 2021-09-21 NOTE — Patient Instructions (Signed)
Have your MD look at your feet.  Begin Breakfast  Egg, toast, fruit  Yogurt, fruit, toast  Be mindful of the sodium content of your food.  Aim to keep your sodium intake <2000 mg per day.  Read labels  Processed meat and most cheese is high in sodium  Rinse canned vegetables  Consider doing the exercises given to you by PT most days.

## 2021-09-21 NOTE — Progress Notes (Signed)
Medical Nutrition Therapy  Appointment Start time:  [phone]  Appointment End time:  0845 Patient is here today alone.  He was last seen by this RD 0815/2022.  Primary concerns today: follow up  Referral diagnosis: Type 2 Diabetes Preferred learning style: no preference indicated Learning readiness: contemplating    NUTRITION ASSESSMENT  Weight is up and patient states that his MD's adjusted his amlodipine and lasix as it is thought to be partially fluid. He is wearing better shoes today and walking better but is unable to get exercise due to back and feet concerns. Stress is increased.  Wife has health issues making it difficult for her to do things in the house.  She has not health insurance and is to be applying for disability. They get $220 per month from a program from [facility] which can be used for healthy food or for pharmacy needs.  They also are given food boxes. His son is still helping financially but he is going to get married soon and it is expected that he will not be able to help at that time. Food choices are high in sodium.  Craves tea and has been making this with sugar. His medications were changed due to insurance.  He does not take the Humalog consistently as he is not eating consistently. Vitamin D deficiency is noted and patient is now on supplementation. States that he is going to weight loss doctor. States that his average blood glucose is 150 (taken random times throughout the day). A1C increasing Blood pressure checked and found to be 120/80 during this visit.   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 eGFR 19 09/28/2020 BUN 39, Creatinine 2.44, Potassium 4.1, eGFR 29 Notable Signs/Symptoms: fluid retention (swelling leg, foot), increased pain and tightness in feet and legs  Lifestyle & Dietary Hx Patient lives with his wife and three sons.  His son is paying the mortgage and helps out a lot with finances.  His wife has mobility issus and has paperwork to complete disability.  Shopping causes him increased physical pain.  He finds it difficult to meet his own nutritional needs as well as the preferences of his family and overall is not cooking.  States that his son's now frequently get take out.  His son helps with the bills but is getting married soon. Rely on food assistance as needed. 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 Middle son has type 2 diabetes and recently diagnosed.  Estimated daily fluid intake: States MD stated to limit to 2 Liters daily.  "Beer intake decreased, but drinks increased amounts when he does drink."   Supplements: vitamin  D Sleep: poor quality "not deep:, only about 5 hours at night.  C-pap is working better. Stress / self-care: financial, disability of wife, difficulty with stress Current average weekly physical activity: PT through July for balance issues and back.  He has continued the stretching at home but not a lot of exercise   24-Hr Dietary Recall First Meal: usually skips Snack: none Second Meal: 1/2 slice vege  pizza Snack: handful of candy corn, handful of potato chips Third Meal: 2 oz cheddar cheese, 1 oz ham, 2 oz [place], 1 oz corned beef, clementine, muscardine grapes OR chicken and rice Snack: 3 slices of cheddar cheese, apple Beverages: water, beer, tea sweet)   NUTRITION DIAGNOSIS  NB-1.1 Food and nutrition-related knowledge deficit As related to balance of carbohydrate, protein, and fat.  As evidenced by diet hx and patient report.   NUTRITION INTERVENTION  Nutrition education (E-1) on the following topics:  Review of sodium/hidden sodium and recommendations to read labels and limit to 600 mg per meal Discussed eating less processed meat/cheese Fluid guidelines and need for adequate water as well as fluid choice (change to unsweetened tea with splenda or stevia) Importance of consistency of exercise - recommendation to do exercises from PT most days Benefits of consistent meals Humalog use for correction of high blood sugar and importance of consistent meals so this Humalog can be given more consistently Importance of controlling blood glucose and blood pressure for kidney health  Handouts Provided Include (previous visit) Low sodium nutrition therapy from [facility] Teaching method utilized: Visual & Auditory  Demonstrated degree of understanding via: Teach Back  Barriers to learning/adherence to lifestyle change: finances, difficulty cooking  Goals Established by Pt Have your MD look at your feet.  Begin Breakfast  Egg, toast, fruit  Yogurt, fruit, toast  Be mindful of the sodium content of your food.  Aim to keep your sodium intake <2000 mg per day.  Read labels  Processed meat and most cheese is high in sodium  Rinse canned vegetables  Consider doing the exercises given to you by PT most days.  MONITORING & EVALUATION Dietary intake, weekly physical activity, and label reading in 2-3 months.  Next Steps  Patient is to call as  needed.

## 2021-09-22 ENCOUNTER — Ambulatory Visit (INDEPENDENT_AMBULATORY_CARE_PROVIDER_SITE_OTHER): Payer: Medicare Other | Admitting: Family Medicine

## 2021-09-22 ENCOUNTER — Encounter (INDEPENDENT_AMBULATORY_CARE_PROVIDER_SITE_OTHER): Payer: Self-pay | Admitting: Family Medicine

## 2021-09-22 VITALS — BP 155/76 | HR 92 | Temp 97.8°F | Ht 69.0 in | Wt 316.0 lb

## 2021-09-22 DIAGNOSIS — E1165 Type 2 diabetes mellitus with hyperglycemia: Secondary | ICD-10-CM | POA: Diagnosis not present

## 2021-09-22 DIAGNOSIS — Z6841 Body Mass Index (BMI) 40.0 and over, adult: Secondary | ICD-10-CM

## 2021-09-22 DIAGNOSIS — Z794 Long term (current) use of insulin: Secondary | ICD-10-CM | POA: Diagnosis not present

## 2021-09-22 NOTE — Progress Notes (Signed)
Chief Complaint:   OBESITY Joshua [name] is here to discuss his progress with his obesity treatment plan along with follow-up of his obesity related diagnoses. Joshua [name] is on keeping a food journal and adhering to recommended goals of 1800-2000 calories and 60 grams protein and states he is following his eating plan approximately 40% of the time. Joshua [name] states he is not currently exercising.  Today's visit was #: 4 Starting weight: 323 lbs Starting date: 08/11/2021 Today's weight: 316 lbs Today's date: 09/22/2021 Total lbs lost to date: 7 Total lbs lost since last in-office visit: (+3)  Interim History: Joshua [name] is struggling to journal because he says he does not eat conventionally. Yesterday he had spaghetti noodles with garlic and butter. He also had a Bertolli pasta meal. He is unwilling to measure food portions in order to journal. Pt is also unwilling to follow a category plan or low carb plan.  Some days he only has one meal. He says servings of rice and pasta are usually > 1 cup. He has hot tea sweetened with sugar. Subjective:   1. Type 2 diabetes mellitus with hyperglycemia, with long-term current use of insulin (HCC) Joshua [name]'s A1c is high at 9.9. Diabetes mellitus is managed by [name]. He does not check his CBG's at home. Pt is on Humalog TID and Lantus BID. He skips meals and but he also skips Humalog when he skips the meal.  Lab Results  Component Value Date   HGBA1C 9.2 (H) 08/11/2021   HGBA1C 9.1 (A) 06/17/2021   HGBA1C 8.3 (A) 02/10/2021   Lab Results  Component Value Date   LDLCALC 169 (H) 08/11/2021   CREATININE 2.44 (H) 08/11/2021   No results found for: INSULIN   Assessment/Plan:   1. Type 2 diabetes mellitus with hyperglycemia, with long-term current use of insulin ([place]) Follow up with [name]. Continue all meds.  2. Obesity with current BMI of 46.64  Joshua [name] is currently in the action stage of change. As such, his goal is to continue with weight  loss efforts. He has agreed to practicing portion control and making smarter food choices, such as increasing vegetables and decreasing simple carbohydrates.   Joshua [name] will try to reduce carb portions. He will have 2 meals with protein. Pt will cut out sugar sweetened beverages.  Exercise goals: No exercise has been prescribed at this time.  Behavioral modification strategies: decreasing simple carbohydrates and no skipping meals.  Joshua [name] has agreed to follow-up with our clinic in 3-4 weeks with Dr. Raliegh Scarlet.  Objective:   Blood pressure (!) 155/76, pulse 92, temperature 97.8 F (36.6 C), height $RemoveBe'5\' 9"'UrisRwmyy$  (1.753 m), weight (!) 316 lb (143.3 kg), SpO2 94 %. Body mass index is 46.67 kg/m.  General: Cooperative, alert, well developed, in no acute distress. HEENT: Conjunctivae and lids unremarkable. Cardiovascular: Regular rhythm.  Lungs: Normal work of breathing. Neurologic: No focal deficits.   Lab Results  Component Value Date   CREATININE 2.44 (H) 08/11/2021   BUN 39 (H) 08/11/2021   NA 141 08/11/2021   K 4.1 08/11/2021   CL 102 08/11/2021   CO2 23 08/11/2021   Lab Results  Component Value Date   ALT 12 08/11/2021   AST 16 08/11/2021   ALKPHOS 183 (H) 08/11/2021   BILITOT <0.2 08/11/2021   Lab Results  Component Value Date   HGBA1C 9.2 (H) 08/11/2021   HGBA1C 9.1 (A) 06/17/2021   HGBA1C 8.3 (A) 02/10/2021   HGBA1C 7.5 (A) 08/15/2020   HGBA1C  8.9 (A) 05/15/2020   No results found for: INSULIN Lab Results  Component Value Date   TSH 2.890 08/11/2021   Lab Results  Component Value Date   CHOL 283 (H) 08/11/2021   HDL 89 08/11/2021   LDLCALC 169 (H) 08/11/2021   TRIG 145 08/11/2021   CHOLHDL 2.0 08/17/2019   Lab Results  Component Value Date   VD25OH 10.3 (L) 08/11/2021   VD25OH 15 (L) 10/30/2013   Lab Results  Component Value Date   WBC 8.6 08/11/2021   HGB 12.3 (L) 08/11/2021   HCT 39.1 08/11/2021   MCV 84 08/11/2021   PLT 254 08/11/2021     Attestation Statements:   Reviewed by clinician on day of visit: allergies, medications, problem list, medical history, surgical history, family history, social history, and previous encounter notes.  Coral Ceo, CMA, am acting as Location manager for [employer], [place].  I have reviewed the above documentation for accuracy and completeness, and I agree with the above. -  Georgianne Fick, FNP

## 2021-09-23 ENCOUNTER — Ambulatory Visit: Payer: Medicare Other | Admitting: Adult Health

## 2021-09-23 ENCOUNTER — Encounter (INDEPENDENT_AMBULATORY_CARE_PROVIDER_SITE_OTHER): Payer: Self-pay | Admitting: Family Medicine

## 2021-10-05 DIAGNOSIS — D631 Anemia in chronic kidney disease: Secondary | ICD-10-CM | POA: Diagnosis not present

## 2021-10-05 DIAGNOSIS — G4733 Obstructive sleep apnea (adult) (pediatric): Secondary | ICD-10-CM | POA: Diagnosis not present

## 2021-10-05 DIAGNOSIS — I5032 Chronic diastolic (congestive) heart failure: Secondary | ICD-10-CM | POA: Diagnosis not present

## 2021-10-05 DIAGNOSIS — N184 Chronic kidney disease, stage 4 (severe): Secondary | ICD-10-CM | POA: Diagnosis not present

## 2021-10-05 DIAGNOSIS — N183 Chronic kidney disease, stage 3 unspecified: Secondary | ICD-10-CM | POA: Diagnosis not present

## 2021-10-05 DIAGNOSIS — I129 Hypertensive chronic kidney disease with stage 1 through stage 4 chronic kidney disease, or unspecified chronic kidney disease: Secondary | ICD-10-CM | POA: Diagnosis not present

## 2021-10-05 DIAGNOSIS — N189 Chronic kidney disease, unspecified: Secondary | ICD-10-CM | POA: Diagnosis not present

## 2021-10-05 DIAGNOSIS — N2581 Secondary hyperparathyroidism of renal origin: Secondary | ICD-10-CM | POA: Diagnosis not present

## 2021-10-06 ENCOUNTER — Telehealth: Payer: Self-pay | Admitting: Dietician

## 2021-10-06 ENCOUNTER — Other Ambulatory Visit: Payer: Self-pay

## 2021-10-06 MED ORDER — GABAPENTIN 100 MG PO CAPS: 100.0000 mg | ORAL_CAPSULE | Freq: Every day | ORAL | 0 refills | Status: DC

## 2021-10-09 ENCOUNTER — Encounter (INDEPENDENT_AMBULATORY_CARE_PROVIDER_SITE_OTHER): Payer: Self-pay | Admitting: Family Medicine

## 2021-10-13 ENCOUNTER — Ambulatory Visit (INDEPENDENT_AMBULATORY_CARE_PROVIDER_SITE_OTHER): Payer: Medicare Other | Admitting: Family Medicine

## 2021-10-13 DIAGNOSIS — D649 Anemia, unspecified: Secondary | ICD-10-CM | POA: Diagnosis not present

## 2021-10-13 DIAGNOSIS — N184 Chronic kidney disease, stage 4 (severe): Secondary | ICD-10-CM | POA: Diagnosis not present

## 2021-10-23 NOTE — Progress Notes (Signed)
Cardiology Office Note:    Date:  10/26/2021   ID:  Joshua [name], DOB 1956-08-30, MRN [id number]  PCP:  [name], MD   [facility] Providers Cardiologist:  [name], MD     Referring MD: [name], MD   Follow-up for CAD and chronic combined systolic and diastolic CHF  History of Present Illness:    Joshua [name] is a 65 y.o. male with a hx of coronary artery disease status post PCI with DES to his LAD and RCA in 2012.  He underwent subsequent PCI with DES to LAD and circumflex in 2017 and PCI to LAD for ISR 4/20, PCI with DES to, PCI with DES to P LAD 9/20, combined chronic CHF, HTN, HLD, type 2 diabetes, status post right metatarsal amputation in 2017, osteomyelitis, GERD, CKD stage IV, obstructive sleep apnea, and obesity.   He was seen and evaluated by Dr. Claiborne Billings 3/21.  At that time he was doing well from a cardiac standpoint.  He denied shortness of breath and chest discomfort.  His echocardiogram 4/20 showed an LVEF of 55-60% with intermediate diastolic dysfunction, suboptimal acoustic windows limited evaluation for wall motion abnormalities, and no significant valvular abnormalities.   He was seen by Roby Lofts, PA-C on 08/26/2020.  During that time he continued to do well from a cardiac standpoint.  He continued to have DOE however, it had improved since his previous cardiology visit.  He denied chest pain.  He reported that he had been out of Imdur for couple months with no issues and had restarted 120 mg daily.  He had lost around 30 pounds with dietary changes.  Who was congratulated on his weight loss.  He did note some dizziness/lightheadedness with waking up in the morning.  He also noted that he had been struggling somewhat with his CPAP mask.  It had been leaking and he was therefore not as compliant with his CPAP over the prior 2 months.   He was seen by Dr. Claiborne Billings 02/23/2021.  During that time he continued to deny chest discomfort.  He was meeting  his compliance standards for his CPAP use and averaging 6 hours and 8 minutes of use per night.  He had received a new CPAP mask which had helped with fitting his face and leaks.   He presented to the clinic 08/11/2021 for follow-up evaluation stated over the past several days he had noticed left chest discomfort.  He had also noticed increased work of breathing with increased physical activity.  He reported that he had taken nitroglycerin 3 times over the past 3 weeks.  He reported that his nitroglycerin was old and that it took about 30 minutes for the medication to work.  His chest discomfort on all occasions came on at rest.  He did not notice chest discomfort with increased exertion.  He did not take his medications on the day of the visit.  His blood pressure was 152/76.  He reported that when he took his amlodipine Imdur, and metoprolol his blood pressure ran in the 614E systolic.  Nephrology had been dosing his furosemide.  We reviewed the importance of low-sodium diet and fluid restriction.  Due to his CHF I will ordered an echocardiogram, give him a fluid restriction of no more than 64 ounces daily,  recommended that he chew sugar-free gum or sugar-free candy, I increased his Imdur to 180 mg daily, asked him to weigh himself daily, and follow-up after his echocardiogram.  His chest  discomfort was reproducible with deep palpation on exam.  His echocardiogram 08/19/2021 showed normal LVEF and G2 DD with no significant changes from previous exam/study.  He presented to the clinic 09/16/2021 for follow-up evaluation and stated he continued to drink excessive amounts of fluid.  He reported cravings for things such as tea and very cold drinks.  Since he was 65 years old he has had what he describes as increased amount of thirst.  [name] prescribed increased furosemide x1 week after he was seen in cardiology.  He did lose a significant amount of weight.  He reported that he did urinate increased amount with  increased diuretic medication.  He had not been weighing daily.  He reported some dietary indiscretion.  We discussed the importance of low-sodium diet and weight log.  I  instructed him to contact the office with a weight increase of 3 pounds overnight or 5 pounds in 1 week.  We  increased his furosemide to 160 mg twice daily x1 week and planned repeat  BMP in 1 week.  I gave him the salty 6 diet sheet, a weight log, and the  [place] support stocking sheet.  We planned follow-up in 1 month.  He presents to the clinic today for follow-up evaluation and states he noticed some cramping after his last round of increased diuresis.  He reports that he did use some mustard which helped with his cramping.  His weight decreased down to 316 pounds and today he weighs 323 pounds.  He does have some mild generalized lower extremity swelling today.  It appears his weight increase is related to actual weight gain.  He does note some lack of sensation in his bilateral lower extremities.  He reports that he has neuropathy and takes gabapentin for this.  His blood pressure initially was 158/80 and on recheck it was 128/78.  He has not been taking his amlodipine for over a month.  We will remove this medication from his list.  I have asked him to maintain a blood pressure log, continue his fluid restriction, increase his physical activity as tolerated, continue heart healthy low-sodium diet, continue to work with [place] weight loss, order a BMP today, and plan follow-up for 3 to 4 months with Dr. Claiborne Billings.   Today he denies chest pain, increased shortness of breath,  fatigue, palpitations, melena, hematuria, hemoptysis, diaphoresis, weakness, presyncope, syncope, orthopnea, and PND.  Past Medical History:  Diagnosis Date   Acute coronary syndrome (HCC)    Anemia    B12 deficiency    Chest pain    CHF (congestive heart failure) (HCC)    Chronic combined systolic and diastolic CHF (congestive heart failure) ([place])     a. 07/2015 Echo: EF 45-50%, mod LVH, mid apical/antsept AK, Gr 1 DD.   Chronic low back pain    CKD (chronic kidney disease), stage III (HCC)    Constipation    COPD (chronic obstructive pulmonary disease) (HCC)    Coronary artery disease    a. 2012 s/p MI/PCI: s/p DES to mid/dist RCA and overlapping DES to LAD;  b. 07/2015 Cath: LAD 10% ISR, D1 40(jailed), LCX 32m OM2 30, OM3 30, RCA 580mSR, 10d ISR; c. 07/2016 NSTEMI/PCI: LAD 85ost/p/m (3.5x20 Synergy DES overlapping prior stent), D1 40, LCX 7586mM2 95 (2.75x16 Synergy DES), OM3 30, RCA 12m70md.   Depression    Diabetic gastroparesis (HCC)[place] Diabetic polyneuropathy (HCC)[place] DKA (diabetic ketoacidoses) 03/14/2018   GERD (  gastroesophageal reflux disease)    Gout    Heart failure (HCC)    Heart murmur    History of MI (myocardial infarction)    Hyperlipemia    Hypertension    Hypertensive heart disease    Hypokalemia    Ischemic cardiomyopathy    a. 07/2015 Echo: EF 45-50%, mod LVH, mid-apicalanteroseptal AK, Gr1 DD.   Joint pain    Kidney problem    Morbid obesity ([place])    OSA on CPAP    Osteoarthritis    Osteomyelitis ([place])    a. 07/2016 MTP joint of R great toe s/p transmetatarsal amputation.   Other fatigue    Polyneuropathy in diabetes(357.2)    Pulmonary sarcoidosis ([place]    "problems w/it years ago; no problems anymore" (07/03/2014)   Rectal bleeding    Shortness of breath    Shortness of breath on exertion    Sleep apnea    Stomach ulcer    Swallowing difficulty    Type II diabetes mellitus ([place])     Past Surgical History:  Procedure Laterality Date   AMPUTATION Right 07/24/2016   Procedure: TRANSMETATARSAL FOOT AMPUTATION;  Surgeon: [name], MD;  Location: [place];  Service: Orthopedics;  Laterality: Right;   BALLOON DILATION N/A 03/21/2018   Procedure: BALLOON DILATION;  Surgeon: [name], MD;  Location: [facility];  Service: Endoscopy;  Laterality: N/A;   BREAST LUMPECTOMY Right ~ 2000   "benign"    CARDIAC CATHETERIZATION N/A 07/30/2015   Procedure: Left Heart Cath and Coronary Angiography;  Surgeon: Burnell Blanks, MD;  Location: [place] CV LAB;  Service: Cardiovascular;  Laterality: N/A;   CARDIAC CATHETERIZATION N/A 07/19/2016   Procedure: Left Heart Cath and Coronary Angiography;  Surgeon: Belva Crome, MD;  Location: [place] [facility];  Service: Cardiovascular;  Laterality: N/A;   CARDIAC CATHETERIZATION N/A 07/29/2016   Procedure: Coronary Stent Intervention;  Surgeon: Belva Crome, MD;  Location: [place] CV LAB;  Service: Cardiovascular;  Laterality: N/A;   CARDIAC CATHETERIZATION N/A 07/29/2016   Procedure: Intravascular Ultrasound/IVUS;  Surgeon: Belva Crome, MD;  Location: [place] [facility];  Service: Cardiovascular;  Laterality: N/A;   CATARACT EXTRACTION W/ INTRAOCULAR LENS  IMPLANT, BILATERAL Bilateral 2014-2015   COLONOSCOPY WITH PROPOFOL N/A 08/22/2017   Procedure: COLONOSCOPY WITH PROPOFOL;  Surgeon: [name], MD;  Location: [place];  Service: Endoscopy;  Laterality: N/A;   CORONARY ANGIOPLASTY WITH STENT PLACEMENT  10/31/2011   30 % MID ATRIOVENTRICULAR GROOVE CX STENOSIS. 90 % MID RCA.PTA TO LAD/STENTING OF TANDEM 60-70%. LAD STENOSIS 99% REDUCED TO 0%.3.0 X 32MM PROMUS DES POSTDILATED TO 3.25MM.   CORONARY ANGIOPLASTY WITH STENT PLACEMENT  07/03/2014   "2"   CORONARY STENT INTERVENTION N/A 04/09/2019   Procedure: CORONARY STENT INTERVENTION;  Surgeon: Jettie Booze, MD;  Location: [place] [facility];  Service: Cardiovascular;  Laterality: N/A;   CORONARY STENT INTERVENTION N/A 08/22/2019   Procedure: CORONARY STENT INTERVENTION;  Surgeon: Martinique, Peter M, MD;  Location: [place] [facility];  Service: Cardiovascular;  Laterality: N/A;   ESOPHAGOGASTRODUODENOSCOPY (EGD) WITH PROPOFOL N/A 03/21/2018   Procedure: ESOPHAGOGASTRODUODENOSCOPY (EGD) WITH PROPOFOL;  Surgeon: [name], MD;  Location: [place];  Service: Endoscopy;   Laterality: N/A;   I & D EXTREMITY Right 10/30/2013   Procedure: IRRIGATION AND DEBRIDEMENT RIGHT SMALL FINGER POSSIBLE SECONDARY WOUND CLOSURE;  Surgeon: [name], MD;  Location: [facility];  Service: Orthopedics;  Laterality: Right;  [name] is  available after 4pm   I & D EXTREMITY Right 11/01/2013   Procedure:  IRRIGATION AND DEBRIDEMENT RIGHT  PROXIMAL PHALANGEAL LEVEL AMPUTATION;  Surgeon: [name], MD;  Location: [facility];  Service: Orthopedics;  Laterality: Right;   INCISION AND DRAINAGE Right 10/28/2013   Procedure: INCISION AND DRAINAGE Right small finger;  Surgeon: [name], MD;  Location: [facility];  Service: Orthopedics;  Laterality: Right;   LEFT HEART CATH Left 11/03/2011   Procedure: LEFT HEART CATH;  Surgeon: [name], MD;  Location: [facility] CATH LAB;  Service: Cardiovascular;  Laterality: Left;   LEFT HEART CATH AND CORONARY ANGIOGRAPHY N/A 04/09/2019   Procedure: LEFT HEART CATH AND CORONARY ANGIOGRAPHY;  Surgeon: Jettie Booze, MD;  Location: [place] [facility];  Service: Cardiovascular;  Laterality: N/A;   LEFT HEART CATHETERIZATION WITH CORONARY ANGIOGRAM N/A 07/03/2014   Procedure: LEFT HEART CATHETERIZATION WITH CORONARY ANGIOGRAM;  Surgeon: [name], MD;  Location: [name] CATH LAB;  Service: Cardiovascular;  Laterality: N/A;   PERCUTANEOUS CORONARY STENT INTERVENTION (PCI-S) Left 11/03/2011   Procedure: PERCUTANEOUS CORONARY STENT INTERVENTION (PCI-S);  Surgeon: [name], MD;  Location: [facility] CATH LAB;  Service: Cardiovascular;  Laterality: Left;   RIGHT/LEFT HEART CATH AND CORONARY ANGIOGRAPHY N/A 08/22/2019   Procedure: RIGHT/LEFT HEART CATH AND CORONARY ANGIOGRAPHY;  Surgeon: Martinique, Peter M, MD;  Location: [place] [facility];  Service: Cardiovascular;  Laterality: N/A;   TOE AMPUTATION Right ~ 2010   "2nd toe"   TOE AMPUTATION Right 2012   "3rd toe"    Current Medications: Current Meds  Medication Sig   Accu-Chek FastClix Lancets  MISC 1 each by Other route in the morning, at noon, in the evening, and at bedtime.    ACCU-CHEK GUIDE test strip USE UP TO FOUR TIMES DAILY AS DIRECTED (Patient taking differently: 1 each by Other route See admin instructions. USE UP TO FOUR TIMES DAILY AS DIRECTED)   acetaminophen (TYLENOL) 650 MG CR tablet Take 1,300 mg by mouth daily as needed for pain.   allopurinol (ZYLOPRIM) 300 MG tablet Take 300 mg by mouth daily.    amLODipine (NORVASC) 10 MG tablet Take 1 tablet by mouth once daily   aspirin EC 81 MG tablet Take 81 mg by mouth daily.    blood glucose meter kit and supplies Dispense based on patient and insurance preference. Use up to four times daily as directed. (FOR ICD-10 E10.9, E11.9). (Patient taking differently: 1 each by Other route See admin instructions. Dispense based on patient and insurance preference. Use up to four times daily as directed. (FOR ICD-10 E10.9, E11.9).)   BRILINTA 90 MG TABS tablet Take 1 tablet by mouth twice daily   famotidine (PEPCID) 20 MG tablet Take 20 mg by mouth at bedtime.   furosemide (LASIX) 80 MG tablet Take 160 mg twice daily for 1 week; then go back to 80 mg twice daily.   gabapentin (NEURONTIN) 100 MG capsule Take 1 capsule (100 mg total) by mouth at bedtime.   insulin glargine (LANTUS SOLOSTAR) 100 UNIT/ML Solostar Pen Inject 30 Units into the skin 2 (two) times daily.   insulin lispro (HUMALOG KWIKPEN) 200 UNIT/ML KwikPen Inject 10-18 Units into the skin 3 (three) times daily before meals.   isosorbide mononitrate (IMDUR) 60 MG 24 hr tablet Take 3 tablets (180 mg total) by mouth daily.   Lidocaine HCl-Benzyl Alcohol (SALONPAS LIDOCAINE PLUS) 4-10 % CREA Apply 1 patch topically as needed.   metoprolol  succinate (TOPROL-XL) 25 MG 24 hr tablet Take 25 mg by mouth daily. Take 3 tablets (72m total) by mouth in the morning and 2 tablets (520mtotal) in the evening.   nitroGLYCERIN (NITROSTAT) 0.4 MG SL tablet Place 1 tablet (0.4 mg total) under the  tongue every 5 (five) minutes as needed for chest pain.   pantoprazole (PROTONIX) 40 MG tablet TAKE 1 TABLET BY MOUTH TWICE DAILY BEFORE MEAL(S)   rosuvastatin (CRESTOR) 20 MG tablet Take 20 mg by mouth daily.   Semaglutide, 1 MG/DOSE, (OZEMPIC, 1 MG/DOSE,) 4 MG/3ML SOPN Inject 1 mg into the skin once a week.   tretinoin (RETIN-A) 0.1 % cream Apply topically daily.   Vitamin D, Ergocalciferol, (DRISDOL) 1.25 MG (50000 UNIT) CAPS capsule Take 1 capsule (50,000 Units total) by mouth every 7 (seven) days.     Allergies:   Chlorthalidone   Social History   Socioeconomic History   Marital status: Married    Spouse name: ElDorian Pod Number of children: 3   Years of education: Not on file   Highest education level: Not on file  Occupational History    Comment: disabled  Tobacco Use   Smoking status: Never   Smokeless tobacco: Never  Vaping Use   Vaping Use: Never used  Substance and Sexual Activity   Alcohol use: Yes    Comment: social    Drug use: No   Sexual activity: Yes    Partners: Female  Other Topics Concern   Not on file  Social History Narrative   Not on file   Social Determinants of Health   Financial Resource Strain: Not on file  Food Insecurity: Not on file  Transportation Needs: Not on file  Physical Activity: Not on file  Stress: Not on file  Social Connections: Not on file     Family History: The patient's family history includes Diabetes in his maternal grandmother; Heart attack in his maternal aunt; Hypertension in his maternal grandmother; Sudden death in his mother and another family member.  ROS:   Please see the history of present illness.     All other systems reviewed and are negative.   Risk Assessment/Calculations:           Physical Exam:    VS:  BP 128/78   Pulse 81   Ht '5\' 9"'  (1.753 m)   Wt (!) 323 lb (146.5 kg)   BMI 47.70 kg/m     Wt Readings from Last 3 Encounters:  10/26/21 (!) 323 lb (146.5 kg)  09/22/21 (!) 316 lb (143.3 kg)   09/21/21 (!) 324 lb (147 kg)     GEN:  Well nourished, well developed in no acute distress HEENT: Normal NECK: No JVD; No carotid bruits LYMPHATICS: No lymphadenopathy CARDIAC: RRR, no murmurs, rubs, gallops RESPIRATORY:  Clear to auscultation without rales, wheezing or rhonchi  ABDOMEN: Soft, non-tender, non-distended MUSCULOSKELETAL:  No edema; No deformity  SKIN: Warm and dry NEUROLOGIC:  Alert and oriented x 3 PSYCHIATRIC:  Normal affect    EKGs/Labs/Other Studies Reviewed:    The following studies were reviewed today:  Echocardiogram 04/07/2019   IMPRESSIONS     1. The left ventricle has normal systolic function, with an ejection  fraction of 55-60%. The cavity size was normal. Indeterminate diastolic  filling due to E-A fusion.   2. LV regional wall motion abnormalities could not be completely assessed  due to suboptimal acoustic windows. Grossly normal wall motion.   3. The inferior vena cava  was dilated in size with <50% respiratory  variability.   FINDINGS   Left Ventricle: The left ventricle has normal systolic function, with an  ejection fraction of 55-60%. The cavity size was normal. There is no  increase in left ventricular wall thickness. Indeterminate diastolic  filling due to E-A fusion.      Right Ventricle: The right ventricle has normal systolic function. The  cavity was normal. There is right vetricular wall thickness was not  assessed.   Left Atrium: Left atrial size was not well visualized.   Right Atrium: Right atrial size was not well visualized. Right atrial  pressure is estimated at 15 mmHg.   Interatrial Septum: The interatrial septum was not well visualized.   Pericardium: There is no evidence of pericardial effusion.   Mitral Valve: The mitral valve is normal in structure. Mitral valve  regurgitation is not visualized by color flow Doppler.   Tricuspid Valve: Tricuspid valve regurgitation was not visualized by color  flow Doppler.    Aortic Valve: The aortic valve is normal in structure. Aortic valve  regurgitation was not visualized by color flow Doppler.   Pulmonic Valve: The pulmonic valve was grossly normal. Pulmonic valve  regurgitation is trivial by color flow Doppler.   Venous: The inferior vena cava is dilated in size with less than 50%  respiratory variability.   Echocardiogram 08/19/21 IMPRESSIONS     1. Left ventricular ejection fraction, by estimation, is 55 to 60%. The  left ventricle has low normal function. Left ventricular endocardial  border not optimally defined to evaluate regional wall motion. There is  mild concentric left ventricular  hypertrophy. Left ventricular diastolic parameters are consistent with  Grade II diastolic dysfunction (pseudonormalization).   2. Right ventricular systolic function is normal. The right ventricular  size is normal. Tricuspid regurgitation signal is inadequate for assessing  PA pressure.   3. Left atrial size was mild to moderately dilated.   4. The mitral valve is normal in structure. No evidence of mitral valve  regurgitation. No evidence of mitral stenosis.   5. The aortic valve was not well visualized. Aortic valve regurgitation  is not visualized. No aortic stenosis is present.   6. The inferior vena cava is normal in size with greater than 50%  respiratory variability, suggesting right atrial pressure of 3 mmHg.   Comparison(s): No significant change from prior study. Prior images  reviewed side by side.  EKG:  EKG is not ordered today.    EKG 09/11/2021 Normal sinus rhythm no ST or T wave deviation 82 bpm  Recent Labs: 08/11/2021: ALT 12; BUN 39; Creatinine, Ser 2.44; Hemoglobin 12.3; Platelets 254; Potassium 4.1; Sodium 141; TSH 2.890  Recent Lipid Panel    Component Value Date/Time   CHOL 283 (H) 08/11/2021 0929   TRIG 145 08/11/2021 0929   HDL 89 08/11/2021 0929   CHOLHDL 2.0 08/17/2019 0402   VLDL 12 08/17/2019 0402   LDLCALC 169 (H)  08/11/2021 0929    ASSESSMENT & PLAN    Chronic combined systolic and diastolic CHF-generalized bilateral nonpitting lower extremity edema.  Continues with chronic dyspnea, noted with increased physical activity.  Follow-up echocardiogram 08/19/2021 showed normal LVEF and G2 DD. Continue metoprolol, hydralazine, furosemide 80 mg daily Heart healthy low-sodium diet-salty 6 given Increase physical activity as tolerated Lower extremity support stockings Daily weights-contact office with a weight increase of 3 pounds overnight or 5 pounds in 1 week Maintain weight log Maintain fluid restriction less than 64 ounces  daily Repeat BMP  Coronary artery disease-no chest pain today.   Cardiac catheterization 4/20 showed widely patent LAD stent, 50% mid circumflex lesion, 40% proximal-mid RCA stenosis, ostial LAD-proximal LAD 90% stenosis.  Underwent scoring balloon angioplasty.  Proximal LAD 90% stenosed. Continue  metoprolol, aspirin, Brilinta Continue Imdur  Heart healthy low-sodium diet-salty 6 given Maintain physical activity as tolerated   Hyperlipidemia-08/11/2021: Cholesterol, Total 283; HDL 89; LDL Chol Calc (NIH) 169; Triglycerides 145 Continue aspirin, rosuvastatin Heart healthy low-sodium high-fiber diet Increase physical activity as tolerated Repeat fasting lipids and LFTs  Essential hypertension-BP today 128/78  Blood pressure at home 130s over 70s .  He continues to work with the weight loss clinic.  Continue metoprolol, Imdur,  Heart healthy low-sodium diet-salty 6 given Increase physical activity as tolerated Continue blood pressure log   OSA-compliant with CPAP.  Waking up fairly rested. Continue CPAP use   Disposition: Follow-up with Dr. Claiborne Billings or me in 3-4 months       Medication Adjustments/Labs and Tests Ordered: Current medicines are reviewed at length with the patient today.  Concerns regarding medicines are outlined above.  No orders of the defined types were  placed in this encounter.  No orders of the defined types were placed in this encounter.   There are no Patient Instructions on file for this visit.   Signed, Deberah Pelton, NP  10/26/2021 10:24 AM       Notice: This dictation was prepared with Dragon dictation along with smaller phrase technology. Any transcriptional errors that result from this process are unintentional and may not be corrected upon review.  I spent 14 minutes examining this patient, reviewing medications, and using patient centered shared decision making involving her cardiac care.  Prior to her visit I spent greater than 20 minutes reviewing her past medical history,  medications, and prior cardiac tests.

## 2021-10-26 ENCOUNTER — Ambulatory Visit (INDEPENDENT_AMBULATORY_CARE_PROVIDER_SITE_OTHER): Payer: Medicare Other | Admitting: General Practice

## 2021-10-26 ENCOUNTER — Encounter (HOSPITAL_BASED_OUTPATIENT_CLINIC_OR_DEPARTMENT_OTHER): Payer: Self-pay | Admitting: General Practice

## 2021-10-26 ENCOUNTER — Other Ambulatory Visit: Payer: Self-pay

## 2021-10-26 VITALS — BP 128/78 | HR 81 | Ht 69.0 in | Wt 323.0 lb

## 2021-10-26 DIAGNOSIS — I10 Essential (primary) hypertension: Secondary | ICD-10-CM

## 2021-10-26 DIAGNOSIS — I25119 Atherosclerotic heart disease of native coronary artery with unspecified angina pectoris: Secondary | ICD-10-CM

## 2021-10-26 DIAGNOSIS — G4733 Obstructive sleep apnea (adult) (pediatric): Secondary | ICD-10-CM

## 2021-10-26 DIAGNOSIS — I5042 Chronic combined systolic (congestive) and diastolic (congestive) heart failure: Secondary | ICD-10-CM

## 2021-10-26 DIAGNOSIS — Z9989 Dependence on other enabling machines and devices: Secondary | ICD-10-CM | POA: Diagnosis not present

## 2021-10-26 DIAGNOSIS — E785 Hyperlipidemia, unspecified: Secondary | ICD-10-CM

## 2021-10-26 NOTE — Patient Instructions (Addendum)
Medication Instructions:  Your physician has recommended you make the following change in your medication:  Stop: Amlodipine (Norvasc) 10mg  tablet   *If you need a refill on your cardiac medications before your next appointment, please call your pharmacy*   Lab Work: Your physician recommends that you return for lab work in:  BMET- Please present to the 3rd floor today for lab work   If you have labs (blood work) drawn today and your tests are completely normal, you will receive your results only by: [employer] (if you have MyChart) OR A paper copy in the mail If you have any lab test that is abnormal or we need to change your treatment, we will call you to review the results.   Testing/Procedures: None today    Follow-Up: At [facility], you and your health needs are our priority.  As part of our continuing mission to provide you with exceptional heart care, we have created designated Provider Care Teams.  These Care Teams include your primary Cardiologist (physician) and Advanced Practice Providers (APPs -  Physician Assistants and Nurse Practitioners) who all work together to provide you with the care you need, when you need it.  We recommend signing up for the patient portal called "MyChart".  Sign up information is provided on this After Visit Summary.  MyChart is used to connect with patients for Virtual Visits (Telemedicine).  Patients are able to view lab/test results, encounter notes, upcoming appointments, etc.  Non-urgent messages can be sent to your provider as well.   To learn more about what you can do with MyChart, go to [email or web address].    Your next appointment:   4 month(s)  The format for your next appointment:   In Person  Provider:   [name], MD     Other Instructions:  Please increase your physical activity as tolerated. We would like your goal to be at 150 minutes of moderate activity per week.   Please take your blood pressure 1  hour after taking your medication and record on log. Please bring this log with you to appointments.

## 2021-10-27 LAB — BASIC METABOLIC PANEL
BUN/Creatinine Ratio: 13 (ref 10–24)
BUN: 33 mg/dL — ABNORMAL HIGH (ref 8–27)
CO2: 22 mmol/L (ref 20–29)
Calcium: 9.2 mg/dL (ref 8.6–10.2)
Chloride: 98 mmol/L (ref 96–106)
Creatinine, Ser: 2.55 mg/dL — ABNORMAL HIGH (ref 0.76–1.27)
Glucose: 217 mg/dL — ABNORMAL HIGH (ref 70–99)
Potassium: 3.5 mmol/L (ref 3.5–5.2)
Sodium: 141 mmol/L (ref 134–144)
eGFR: 28 mL/min/{1.73_m2} — ABNORMAL LOW (ref >59)

## 2021-10-28 ENCOUNTER — Encounter: Payer: Self-pay | Admitting: Internal Medicine

## 2021-10-28 ENCOUNTER — Encounter (HOSPITAL_BASED_OUTPATIENT_CLINIC_OR_DEPARTMENT_OTHER): Payer: Self-pay

## 2021-10-28 ENCOUNTER — Other Ambulatory Visit: Payer: Self-pay

## 2021-10-28 ENCOUNTER — Ambulatory Visit (INDEPENDENT_AMBULATORY_CARE_PROVIDER_SITE_OTHER): Payer: Medicare Other | Admitting: Internal Medicine

## 2021-10-28 VITALS — BP 150/88 | HR 83 | Ht 69.0 in | Wt 324.0 lb

## 2021-10-28 DIAGNOSIS — E1165 Type 2 diabetes mellitus with hyperglycemia: Secondary | ICD-10-CM

## 2021-10-28 DIAGNOSIS — E1159 Type 2 diabetes mellitus with other circulatory complications: Secondary | ICD-10-CM

## 2021-10-28 DIAGNOSIS — E785 Hyperlipidemia, unspecified: Secondary | ICD-10-CM

## 2021-10-28 LAB — POCT GLYCOSYLATED HEMOGLOBIN (HGB A1C): Hemoglobin A1C: 11.2 % — AB (ref 4.0–5.6)

## 2021-10-28 MED ORDER — FREESTYLE LIBRE 2 SENSOR MISC: 1.0000 | 3 refills | Status: DC

## 2021-10-28 MED ORDER — FREESTYLE LIBRE 2 READER DEVI: 1.0000 | Freq: Every day | 0 refills | Status: DC

## 2021-10-28 NOTE — Progress Notes (Signed)
[name] ID: Joshua [name], male   DOB: Apr 26, 1956, 65 y.o.   MRN: [id number]   This visit occurred during the SARS-CoV-2 public health emergency.  Safety protocols were in place, including screening questions prior to the visit, additional usage of staff PPE, and extensive cleaning of exam room while observing appropriate contact time as indicated for disinfecting solutions.   HPI: Joshua [name] is a 65 y.o.-year-old male, initially referred by his PCP, Dr. Alyson Ingles, returning for follow-up for DM2, dx in 1990s, insulin-dependent, uncontrolled, with complications (CAD - s/p AMI 2012, s/p PCIs, ischemic cardiomyopathy, CHF, CKD stage 4, PN - s/p R transmetatarsal amputation, gastroparesis, DR, history of DKA). He saw Dr Dwyane Dee initially, then Dr. Chalmers Cater for 20 years, but she was not accepting Medicaid patients anymore so he had to switch providers.  Last visit with me 4 months ago. Before last visit, he changed his insurance to [employer].  Interim history: No increased urination, blurry vision, nausea, chest pain. He has GERD and had chest pain from this in the past. He continues to have back pain.  He saw orthopedics.  No need for surgery.  He started to go to physical therapy >> now better pain score and balance. Since last visit he established care with a weight management clinic.  However, he missed an appointment with them. He has a 64 oz fluid restriction as he has leg swelling.  Reviewed HbA1c levels: Lab Results  Component Value Date   HGBA1C 9.2 (H) 08/11/2021   HGBA1C 9.1 (A) 06/17/2021   HGBA1C 8.3 (A) 02/10/2021   HGBA1C 7.5 (A) 08/15/2020   HGBA1C 8.9 (A) 05/15/2020   HGBA1C 13.8 (A) 01/28/2020   HGBA1C 10.2 (H) 08/16/2019   HGBA1C 15.0 (H) 04/07/2019   HGBA1C >15.5 (H) 03/15/2018   HGBA1C 11.8 (H) 05/19/2017   He was on: - Lantus 20 units 2x a day - NovoLog 10 units 3x a day, before meals Prev. On 70/30 until Spring 2020.  Currently on: -  Basaglar 30 >> 20-25 >> 30 units a day 2x a day - NovoLog >> Humalog 10-15 >> 10-20 >> 10-12 >> 18 units before meals - misses doses, but tries to take it before meals (1-2x a day) -  Ozempic 1 mg weekly  He is not checking sugars! Prev.: - am: 163, 290, 329 >> 84 >> n/c >> 120, 171 - 2h after b'fast: n/c - before lunch: 140-180 >> 99, 134, 191-216 - 2h after lunch: n/c >> 274 >> n/c >> 304 - before dinner: n/c >> 134, 217 >> 151 >> n/c - 2h after dinner: n/c >> 145 - bedtime: 215- 500s >> 189, 268, 309 - nighttime: n/c >> HI >> 73-215 >> 121 Lowest sugar was 50 >> ...>> 60s (took insulin after he ate) >> 99 >> ?; he has hypoglycemia awareness at 100.  Highest sugar was 538 >> 389 >> ?.   Glucometer: ReliOn  Pt's meals are: - Breakfast: skips - Lunch: may skip, PB or chicken - Dinner: chicken w/o sides or with + veggies + starch;  - Snacks: occasional sweets: Hershey bar, sweet tea, cool aid  -+ CKD stage IV- Dr [name], last BUN/creatinine:  Lab Results  Component Value Date   BUN 33 (H) 10/26/2021   BUN 39 (H) 08/11/2021   CREATININE 2.55 (H) 10/26/2021   CREATININE 2.44 (H) 08/11/2021  10/18/2019: 24/2.92 10/18/2019: ACR 765.3  Lab Results  Component Value Date   GFRAA 27 (L) 08/23/2019  GFRAA 25 (L) 08/22/2019   GFRAA 24 (L) 08/21/2019   GFRAA 22 (L) 08/20/2019   GFRAA 29 (L) 08/19/2019  Not on ACE inhibitor/ARB  -+ HL; last set of lipids: Lab Results  Component Value Date   CHOL 283 (H) 08/11/2021   HDL 89 08/11/2021   LDLCALC 169 (H) 08/11/2021   TRIG 145 08/11/2021   CHOLHDL 2.0 08/17/2019  09/05/2020: 112/137/38/49 10/18/2019: 143/105/53/69 On Crestor 40 - restarted 07/2021, stopped Zetia 10 >1 year ago.  - last eye exam was in 02/2021: + DR. Had cataract sx in 2014, 2015.  -+ numbness in his feet.  He also has significant back pain.  He sees neurology for peripheral neuropathy, leg pain, instability.  Now on gabapentin.  On ASA 81.  Latest TSH:  Lab  Results  Component Value Date   TSH 2.890 08/11/2021   Pt has FH of DM in [place].  He also has a history of HTN, OSA, GERD, esophageal ulcers.  No h/o pancreatitis or FH of MTC.  He is in a play in [place].  ROS: See HPI  I reviewed pt's medications, allergies, PMH, social hx, family hx, and changes were documented in the history of present illness. Otherwise, unchanged from my initial visit note.  Past Medical History:  Diagnosis Date   Acute coronary syndrome (HCC)    Anemia    B12 deficiency    Chest pain    CHF (congestive heart failure) (HCC)    Chronic combined systolic and diastolic CHF (congestive heart failure) ([place])    a. 07/2015 Echo: EF 45-50%, mod LVH, mid apical/antsept AK, Gr 1 DD.   Chronic low back pain    CKD (chronic kidney disease), stage III (HCC)    Constipation    COPD (chronic obstructive pulmonary disease) (HCC)    Coronary artery disease    a. 2012 s/p MI/PCI: s/p DES to mid/dist RCA and overlapping DES to LAD;  b. 07/2015 Cath: LAD 10% ISR, D1 40(jailed), LCX 52m OM2 30, OM3 30, RCA 515mSR, 10d ISR; c. 07/2016 NSTEMI/PCI: LAD 85ost/p/m (3.5x20 Synergy DES overlapping prior stent), D1 40, LCX 7524mM2 95 (2.75x16 Synergy DES), OM3 30, RCA 31m18md.   Depression    Diabetic gastroparesis (HCC)[place] Diabetic polyneuropathy (HCC)[place] DKA (diabetic ketoacidoses) 03/14/2018   GERD (gastroesophageal reflux disease)    Gout    Heart failure (HCC)    Heart murmur    History of MI (myocardial infarction)    Hyperlipemia    Hypertension    Hypertensive heart disease    Hypokalemia    Ischemic cardiomyopathy    a. 07/2015 Echo: EF 45-50%, mod LVH, mid-apicalanteroseptal AK, Gr1 DD.   Joint pain    Kidney problem    Morbid obesity (HCC)[place] OSA on CPAP    Osteoarthritis    Osteomyelitis (HCC)[place] a. 07/2016 MTP joint of R great toe s/p transmetatarsal amputation.   Other fatigue    Polyneuropathy in diabetes(357.2)    Pulmonary sarcoidosis (HCC)[place]  "problems w/it years ago; no problems anymore" (07/03/2014)   Rectal bleeding    Shortness of breath    Shortness of breath on exertion    Sleep apnea    Stomach ulcer    Swallowing difficulty    [facility] Past Surgical History:  Procedure Laterality Date   AMPUTATION Right 07/24/2016   Procedure: TRANSMETATARSAL FOOT AMPUTATION;  Surgeon: [name], MD;  Location: [place];  Service: Orthopedics;  Laterality: Right;   BALLOON DILATION N/A 03/21/2018   Procedure: BALLOON DILATION;  Surgeon: [name], MD;  [facility] ENDOSCOPY;  Service: Endoscopy;  Laterality: N/A;   BREAST LUMPECTOMY Right ~ 2000   "benign"   CARDIAC CATHETERIZATION N/A 07/30/2015   Procedure: Left Heart Cath and Coronary Angiography;  Surgeon: Burnell Blanks, MD;  Location: [place] CV LAB;  Service: Cardiovascular;  Laterality: N/A;   CARDIAC CATHETERIZATION N/A 07/19/2016   Procedure: Left Heart Cath and Coronary Angiography;  Surgeon: Belva Crome, MD;  Location: [place] CV LAB;  Service: Cardiovascular;  Laterality: N/A;   CARDIAC CATHETERIZATION N/A 07/29/2016   Procedure: Coronary Stent Intervention;  Surgeon: Belva Crome, MD;  Location: [place] CV LAB;  Service: Cardiovascular;  Laterality: N/A;   CARDIAC CATHETERIZATION N/A 07/29/2016   Procedure: Intravascular Ultrasound/IVUS;  Surgeon: Belva Crome, MD;  Location: [place] CV LAB;  Service: Cardiovascular;  Laterality: N/A;   CATARACT EXTRACTION W/ INTRAOCULAR LENS  IMPLANT, BILATERAL Bilateral 2014-2015   COLONOSCOPY WITH PROPOFOL N/A 08/22/2017   Procedure: COLONOSCOPY WITH PROPOFOL;  Surgeon: [name], MD;  Location: [place];  Service: Endoscopy;  Laterality: N/A;   CORONARY ANGIOPLASTY WITH STENT PLACEMENT  10/31/2011   30 % MID ATRIOVENTRICULAR GROOVE CX STENOSIS. 90 % MID RCA.PTA TO LAD/STENTING OF TANDEM 60-70%. LAD STENOSIS 99% REDUCED TO 0%.3.0 X 32MM PROMUS DES POSTDILATED TO 3.25MM.    CORONARY ANGIOPLASTY WITH STENT PLACEMENT  07/03/2014   "2"   CORONARY STENT INTERVENTION N/A 04/09/2019   Procedure: CORONARY STENT INTERVENTION;  Surgeon: Jettie Booze, MD;  Location: [place] CV LAB;  Service: Cardiovascular;  Laterality: N/A;   CORONARY STENT INTERVENTION N/A 08/22/2019   Procedure: CORONARY STENT INTERVENTION;  Surgeon: Martinique, Peter M, MD;  Location: [place] CV LAB;  Service: Cardiovascular;  Laterality: N/A;   ESOPHAGOGASTRODUODENOSCOPY (EGD) WITH PROPOFOL N/A 03/21/2018   Procedure: ESOPHAGOGASTRODUODENOSCOPY (EGD) WITH PROPOFOL;  Surgeon: [name], MD;  Location: [place];  Service: Endoscopy;  Laterality: N/A;   I & D EXTREMITY Right 10/30/2013   Procedure: IRRIGATION AND DEBRIDEMENT RIGHT SMALL FINGER POSSIBLE SECONDARY WOUND CLOSURE;  Surgeon: [name], MD;  Location: [facility];  Service: Orthopedics;  Laterality: Right;  [name] is available after 4pm   I & D EXTREMITY Right 11/01/2013   Procedure:  IRRIGATION AND DEBRIDEMENT RIGHT  PROXIMAL PHALANGEAL LEVEL AMPUTATION;  Surgeon: [name], MD;  Location: [facility];  Service: Orthopedics;  Laterality: Right;   INCISION AND DRAINAGE Right 10/28/2013   Procedure: INCISION AND DRAINAGE Right small finger;  Surgeon: [name], MD;  Location: [facility];  Service: Orthopedics;  Laterality: Right;   LEFT HEART CATH Left 11/03/2011   Procedure: LEFT HEART CATH;  Surgeon: [name], MD;  Location[facility] CATH LAB;  Service: Cardiovascular;  Laterality: Left;   LEFT HEART CATH AND CORONARY ANGIOGRAPHY N/A 04/09/2019   Procedure: LEFT HEART CATH AND CORONARY ANGIOGRAPHY;  Surgeon: Jettie Booze, MD;  Location: [place] CV LAB;  Service: Cardiovascular;  Laterality: N/A;   LEFT HEART CATHETERIZATION WITH CORONARY ANGIOGRAM N/A 07/03/2014   Procedure: LEFT HEART CATHETERIZATION WITH CORONARY ANGIOGRAM;  Surgeon: [name], MD;  Location[facility] CATH LAB;  Service:  Cardiovascular;  Laterality: N/A;   PERCUTANEOUS CORONARY STENT INTERVENTION (PCI-S) Left 11/03/2011   Procedure: PERCUTANEOUS CORONARY STENT INTERVENTION (PCI-S);  Surgeon: [name],  MD;  Location: [place] CATH LAB;  Service: Cardiovascular;  Laterality: Left;   RIGHT/LEFT HEART CATH AND CORONARY ANGIOGRAPHY N/A 08/22/2019   Procedure: RIGHT/LEFT HEART CATH AND CORONARY ANGIOGRAPHY;  Surgeon: Martinique, Peter M, MD;  Location: [place] CV LAB;  Service: Cardiovascular;  Laterality: N/A;   TOE AMPUTATION Right ~ 2010   "2nd toe"   TOE AMPUTATION Right 2012   "3rd toe"   Social History   Socioeconomic History   Marital status: Married    Spouse name: Dorian Pod   Number of children: 3   Years of education: Not on file   Highest education level: Not on file  Occupational History    Comment: disabled  Tobacco Use   Smoking status: Never   Smokeless tobacco: Never  Vaping Use   Vaping Use: Never used  Substance and Sexual Activity   Alcohol use: Yes    Comment: social    Drug use: No   Sexual activity: Yes    Partners: Female  Other Topics Concern   Not on file  Social History Narrative   Not on file   Social Determinants of Health   Financial Resource Strain: Not on file  Food Insecurity: Not on file  Transportation Needs: Not on file  Physical Activity: Not on file  Stress: Not on file  Social Connections: Not on file  Intimate Partner Violence: Not on file   Current Outpatient Medications on File Prior to Visit  Medication Sig Dispense Refill   Accu-Chek FastClix Lancets MISC 1 each by Other route in the morning, at noon, in the evening, and at bedtime.      ACCU-CHEK GUIDE test strip USE UP TO FOUR TIMES DAILY AS DIRECTED (Patient taking differently: 1 each by Other route See admin instructions. USE UP TO FOUR TIMES DAILY AS DIRECTED) 300 each 3   acetaminophen (TYLENOL) 650 MG CR tablet Take 1,300 mg by mouth daily as needed for pain.     allopurinol (ZYLOPRIM) 300 MG  tablet Take 300 mg by mouth daily.      aspirin EC 81 MG tablet Take 81 mg by mouth daily.      blood glucose meter kit and supplies Dispense based on patient and insurance preference. Use up to four times daily as directed. (FOR ICD-10 E10.9, E11.9). (Patient taking differently: 1 each by Other route See admin instructions. Dispense based on patient and insurance preference. Use up to four times daily as directed. (FOR ICD-10 E10.9, E11.9).) 1 each 0   BRILINTA 90 MG TABS tablet Take 1 tablet by mouth twice daily 180 tablet 0   famotidine (PEPCID) 20 MG tablet Take 20 mg by mouth at bedtime.     furosemide (LASIX) 80 MG tablet Take 160 mg twice daily for 1 week; then go back to 80 mg twice daily. 180 tablet 1   gabapentin (NEURONTIN) 100 MG capsule Take 1 capsule (100 mg total) by mouth at bedtime. 30 capsule 0   insulin glargine (LANTUS SOLOSTAR) 100 UNIT/ML Solostar Pen Inject 30 Units into the skin 2 (two) times daily. 45 mL 3   insulin lispro (HUMALOG KWIKPEN) 200 UNIT/ML KwikPen Inject 10-18 Units into the skin 3 (three) times daily before meals. 45 mL 3   isosorbide mononitrate (IMDUR) 60 MG 24 hr tablet Take 3 tablets (180 mg total) by mouth daily. 90 tablet 3   Lidocaine HCl-Benzyl Alcohol (SALONPAS LIDOCAINE PLUS) 4-10 % CREA Apply 1 patch topically as needed.     metoprolol succinate (  TOPROL-XL) 25 MG 24 hr tablet Take 25 mg by mouth daily. Take 3 tablets (27m total) by mouth in the morning and 2 tablets (551mtotal) in the evening.     nitroGLYCERIN (NITROSTAT) 0.4 MG SL tablet Place 1 tablet (0.4 mg total) under the tongue every 5 (five) minutes as needed for chest pain. 25 tablet 1   pantoprazole (PROTONIX) 40 MG tablet TAKE 1 TABLET BY MOUTH TWICE DAILY BEFORE MEAL(S) 180 tablet 0   rosuvastatin (CRESTOR) 20 MG tablet Take 20 mg by mouth daily.     Semaglutide, 1 MG/DOSE, (OZEMPIC, 1 MG/DOSE,) 4 MG/3ML SOPN Inject 1 mg into the skin once a week. 9 mL 3   tretinoin (RETIN-A) 0.1 %  cream Apply topically daily.     Vitamin D, Ergocalciferol, (DRISDOL) 1.25 MG (50000 UNIT) CAPS capsule Take 1 capsule (50,000 Units total) by mouth every 7 (seven) days. 4 capsule 0   No current facility-administered medications on file prior to visit.   Allergies  Allergen Reactions   Chlorthalidone Other (See Comments)    Causes dehydration, kidneys shut down   Family History  Problem Relation Age of Onset   Sudden death Mother    Diabetes Maternal Grandmother    Hypertension Maternal Grandmother    Heart attack Maternal Aunt    Sudden death Other     PE: BP (!) 150/88 (BP Location: Right Arm, Patient Position: Sitting, Cuff Size: Normal)   Pulse 83   Ht '5\' 9"'  (1.753 m)   Wt (!) 324 lb (147 kg)   SpO2 96%   BMI 47.85 kg/m  Wt Readings from Last 3 Encounters:  10/28/21 (!) 324 lb (147 kg)  10/26/21 (!) 323 lb (146.5 kg)  09/22/21 (!) 316 lb (143.3 kg)   Constitutional: overweight, in NAD Eyes: PERRLA, EOMI, no exophthalmos ENT: moist mucous membranes, no thyromegaly, no cervical lymphadenopathy Cardiovascular: RRR, No MRG, + LE edema Respiratory: CTA B Gastrointestinal: abdomen soft, NT, ND, BS+ Musculoskeletal: + Deformities: Right transmetatarsal amputation, strength intact in all 4 Skin: moist, warm, no rashes Neurological: no tremor with outstretched hands, DTR normal in all 4  ASSESSMENT: 1. DM2, insulin-dependent, uncontrolled, with complications - CAD - s/p AMI 2012, s/p multiple PCI's; ischemic cardiomyopathy; CHF. Dr. KeClaiborne Billings CKD stage 4 - DR - PN - s/p R transmetatarsal amputation - Gastroparesis - History of DKA  PLAN:  1. Patient with longstanding, trolled, type 2 diabetes, on basal-bolus insulin regimen and GLP-1 receptor agonist with improved control in the last 1.5 years after he improved his diet.  HbA1c improved from 13.8% to 7.5%, but at last visit, this was higher, at 9.1%.  Since last visit, he had another HbA1c obtained in 07/2021 slightly  higher, at 9.2%. -After last visit, was finally able to switch him from Victoza to OzBedford He tolerates this well -At this visit, he is not checking sugars!  HbA1c obtained today is much higher, at 11.2%. -He mentions that he is only eating once a day and only bolusing with his meal from approximately 18 units of Humalog.  We discussed that this is not appropriate, he needs to spread the calories throughout the day.  I strongly encouraged him to continue to see the weight management clinic.  He missed 1 appointment and I advised him that he may be fired if he continues to miss appointments.  States he does not bring any blood sugars today, I cannot make further changes in his regimen but I did advise  him to vary the dose of Humalog more before meals. -I did suggest a freestyle libre CGM and sent a prescription to his pharmacy.   - I suggested to:  Patient Instructions  Please use the following regimen: - Basaglar 30 units a day 2x a day - Humalog 15-20 units 15 minutes before meals - Ozempic 1 mg weekly  Check sugars 4x a day.  Try to get the CGM.  Please come back for a follow-up appointment in 1.5 months.  - advised to check sugars at different times of the day - 4x a day, rotating check times - advised for yearly eye exams >> he is UTD - return to clinic in 1.5 months  2. HL - Reviewed latest lipid panel from 07/2020: LDL high, the rest of the fractions at goal: Lab Results  Component Value Date   CHOL 283 (H) 08/11/2021   HDL 89 08/11/2021   LDLCALC 169 (H) 08/11/2021   TRIG 145 08/11/2021   CHOLHDL 2.0 08/17/2019  - Continues on Crestor 40 mg daily (restarted 07/2021)  without side effects  3.  Obesity class III -He has large fluctuations in his weight usually possibly also due to fluid status -In the past, we discussed about improving meal schedule and referred him to nutrition.  Barriers to improve nutrition work: Increased beer intake, eating takeout frequently, sleeping  only 5 hours a night. -In the past I suggested Ozempic but he could not obtain it.  We had to continue on Victoza but at last visit he was telling me that he was able to change insurance this and I sent in another Ozempic prescription to his pharmacy. -At last visit, he lost 16 pounds from the previous visit; he gained 22 pounds since last visit! -Since last visit, he started to see the weight management clinic  Philemon Kingdom, MD PhD [facility] Endocrinology

## 2021-10-28 NOTE — Patient Instructions (Addendum)
Please use the following regimen: - Basaglar 30 units a day 2x a day - Humalog 15-20 units 15 minutes before meals - Ozempic 1 mg weekly  Check sugars 4x a day.  Try to get the CGM.  Please come back for a follow-up appointment in 1.5 months.

## 2021-10-28 NOTE — Progress Notes (Signed)
Pt. Results shared via my chart message.

## 2021-10-29 ENCOUNTER — Encounter: Payer: Self-pay | Admitting: Internal Medicine

## 2021-11-03 ENCOUNTER — Ambulatory Visit (INDEPENDENT_AMBULATORY_CARE_PROVIDER_SITE_OTHER): Payer: Medicare Other | Admitting: Orthopaedic Surgery

## 2021-11-03 ENCOUNTER — Other Ambulatory Visit: Payer: Self-pay

## 2021-11-03 ENCOUNTER — Encounter: Payer: Self-pay | Admitting: Orthopaedic Surgery

## 2021-11-03 DIAGNOSIS — M48061 Spinal stenosis, lumbar region without neurogenic claudication: Secondary | ICD-10-CM

## 2021-11-03 NOTE — Progress Notes (Signed)
Office Visit Note   Patient: Joshua [name]           Date of Birth: [birth year]           MRN: [id number] Visit Date: 11/03/2021              Requested by: [name], MD [place],  [place] [address] PCP: [name], MD   Assessment & Plan: Visit Diagnoses:  1. Spinal stenosis of lumbar region, unspecified whether neurogenic claudication present     Plan: Previous MRI showed multilevel degenerative changes with increased epidural fat contributing to multilevel stenosis.  No single level of severe compression.  We discussed weight loss and benefits for diabetic control, hypertension, cholesterol, heart failure problems, and also improvement in back pain symptoms.  Recheck 6 months.  Follow-Up Instructions: Return in about 6 months (around 05/03/2022).   Orders:  No orders of the defined types were placed in this encounter.  No orders of the defined types were placed in this encounter.     Procedures: No procedures performed   Clinical Data: No additional findings.   Subjective: Chief Complaint  Patient presents with   Lower Back - Pain, Follow-up    HPI 65 year old male with chronic back pain.  He states pain is present with activity.  He states is really not getting better.  Previous weight was 304 he supposed to work on getting his weight down below BMI of 40 and since last visit he has gained 19 pounds which was in May 2022.  Patient states his weight fluctuates.  He has problems with fluid retention renal problems, insulin-dependent diabetes, polyneuropathy, heart problems with previous heart attack, history of angina, stage III kidney disease and diastolic and slight systolic heart failure.  Patient states he is currently on fluid restriction.  He is using Tylenol for pain also Salonpas.  Review of Systems no associated bowel or bladder problems.   Objective: Vital Signs: BP (!) 179/74   Pulse 79   Ht $R'5\' 9"'Ya$  (1.753 m)   Wt (!)  323 lb 12.8 oz (146.9 kg)   BMI 47.82 kg/m   Physical Exam Constitutional:      Appearance: He is well-developed.  HENT:     Head: Normocephalic and atraumatic.     Right Ear: External ear normal.     Left Ear: External ear normal.  Eyes:     Pupils: Pupils are equal, round, and reactive to light.  Neck:     Thyroid: No thyromegaly.     Trachea: No tracheal deviation.  Cardiovascular:     Rate and Rhythm: Normal rate.  Pulmonary:     Effort: Pulmonary effort is normal.     Breath sounds: No wheezing.  Abdominal:     General: Bowel sounds are normal.     Palpations: Abdomen is soft.  Musculoskeletal:     Cervical back: Neck supple.  Skin:    General: Skin is warm and dry.     Capillary Refill: Capillary refill takes less than 2 seconds.  Neurological:     Mental Status: He is alert and oriented to person, place, and time.  Psychiatric:        Behavior: Behavior normal.        Thought Content: Thought content normal.        Judgment: Judgment normal.    Ortho Exam patient has normal gait sequence he gets from sitting to standing.  Reflexes are intact negative logroll  of the hips.  No rash over exposed skin.  Specialty Comments:  No specialty comments available.  Imaging: No results found.   PMFS History: Patient Active Problem List   Diagnosis Date Noted   Spinal stenosis of lumbar region 11/03/2021   Mixed diabetic hyperlipidemia associated with type 2 diabetes mellitus ([place]) 08/11/2021   Chronic low back pain    Hypokalemia 09/25/2020   Type 2 diabetes mellitus with hyperglycemia, with long-term current use of insulin ([place]) 09/24/2020   Unstable angina ([place]) 08/17/2019   Chest pain 08/16/2019   NSTEMI (non-ST elevated myocardial infarction) ([place]) 04/06/2019   ACS (acute coronary syndrome) ([place]) 04/06/2019   Sepsis ([place]) 03/15/2018   DKA (diabetic ketoacidoses) 03/15/2018   Special screening for malignant neoplasms, colon 08/22/2017   Acute renal failure  superimposed on stage 3 chronic kidney disease ([place]) 05/19/2017   Hypertension associated with diabetes ([place])    Status post transmetatarsal amputation of foot, right ([place]) 11/08/2016   Diabetic polyneuropathy associated with type 2 diabetes mellitus ([place]) 11/08/2016   Pure hypercholesterolemia    AKI (acute kidney injury) ([place])    Acute blood loss anemia    DM type 2 with diabetic peripheral neuropathy ([place])    Physical debility 08/03/2016   Chronic osteomyelitis of right foot (HCC)    Cellulitis and abscess of leg, except foot    Penetrating wound of right foot    CKD (chronic kidney disease) stage 3, GFR 30-59 ml/min (HCC)    Unstable angina pectoris ([place]) 07/18/2016   Chronic combined systolic and diastolic congestive heart failure ([place])    Chest pain with moderate risk for cardiac etiology 06/24/2014   CAD S/P multiple PCIs 06/24/2014   Sleep apnea- on C-pap 06/24/2014   Class 3 severe obesity with serious comorbidity and body mass index (BMI) of 45.0 to 49.9 in adult [facility]) 11/10/2013   Hyperglycemia 10/29/2013   Hyperlipidemia 01/12/2011   Benign essential HTN 01/12/2011   GERD 01/12/2011   Past Medical History:  Diagnosis Date   Acute coronary syndrome (HCC)    Anemia    B12 deficiency    Chest pain    CHF (congestive heart failure) (HCC)    Chronic combined systolic and diastolic CHF (congestive heart failure) ([place])    a. 07/2015 Echo: EF 45-50%, mod LVH, mid apical/antsept AK, Gr 1 DD.   Chronic low back pain    CKD (chronic kidney disease), stage III (HCC)    Constipation    COPD (chronic obstructive pulmonary disease) (HCC)    Coronary artery disease    a. 2012 s/p MI/PCI: s/p DES to mid/dist RCA and overlapping DES to LAD;  b. 07/2015 Cath: LAD 10% ISR, D1 40(jailed), LCX 61m, OM2 30, OM3 30, RCA 90m ISR, 10d ISR; c. 07/2016 NSTEMI/PCI: LAD 85ost/p/m (3.5x20 Synergy DES overlapping prior stent), D1 40, LCX 32m, OM2 95 (2.75x16 Synergy DES), OM3 30, RCA 39m, 10d.    Depression    Diabetic gastroparesis ([place])    Diabetic polyneuropathy ([place])    DKA (diabetic ketoacidoses) 03/14/2018   GERD (gastroesophageal reflux disease)    Gout    Heart failure (HCC)    Heart murmur    History of MI (myocardial infarction)    Hyperlipemia    Hypertension    Hypertensive heart disease    Hypokalemia    Ischemic cardiomyopathy    a. 07/2015 Echo: EF 45-50%, mod LVH, mid-apicalanteroseptal AK, Gr1 DD.   Joint pain    Kidney problem  Morbid obesity ([place])    OSA on CPAP    Osteoarthritis    Osteomyelitis ([place])    a. 07/2016 MTP joint of R great toe s/p transmetatarsal amputation.   Other fatigue    Polyneuropathy in diabetes(357.2)    Pulmonary sarcoidosis ([place])    "problems w/it years ago; no problems anymore" (07/03/2014)   Rectal bleeding    Shortness of breath    Shortness of breath on exertion    Sleep apnea    Stomach ulcer    Swallowing difficulty    Type II diabetes mellitus ([place])     Family History  Problem Relation Age of Onset   Sudden death Mother    Diabetes Maternal Grandmother    Hypertension Maternal Grandmother    Heart attack Maternal Aunt    Sudden death Other     Past Surgical History:  Procedure Laterality Date   AMPUTATION Right 07/24/2016   Procedure: TRANSMETATARSAL FOOT AMPUTATION;  Surgeon: [name], MD;  Location: [facility];  Service: Orthopedics;  Laterality: Right;   BALLOON DILATION N/A 03/21/2018   Procedure: BALLOON DILATION;  Surgeon: [name], MD;  Location: [facility] ENDOSCOPY;  Service: Endoscopy;  Laterality: N/A;   BREAST LUMPECTOMY Right ~ 2000   "benign"   CARDIAC CATHETERIZATION N/A 07/30/2015   Procedure: Left Heart Cath and Coronary Angiography;  Surgeon: Burnell Blanks, MD;  Location: [place] [facility];  Service: Cardiovascular;  Laterality: N/A;   CARDIAC CATHETERIZATION N/A 07/19/2016   Procedure: Left Heart Cath and Coronary Angiography;  Surgeon: Belva Crome, MD;  Location: [place] [facility];   Service: Cardiovascular;  Laterality: N/A;   CARDIAC CATHETERIZATION N/A 07/29/2016   Procedure: Coronary Stent Intervention;  Surgeon: Belva Crome, MD;  Location: [place] [facility];  Service: Cardiovascular;  Laterality: N/A;   CARDIAC CATHETERIZATION N/A 07/29/2016   Procedure: Intravascular Ultrasound/IVUS;  Surgeon: Belva Crome, MD;  Location: [place] [facility];  Service: Cardiovascular;  Laterality: N/A;   CATARACT EXTRACTION W/ INTRAOCULAR LENS  IMPLANT, BILATERAL Bilateral 2014-2015   COLONOSCOPY WITH PROPOFOL N/A 08/22/2017   Procedure: COLONOSCOPY WITH PROPOFOL;  Surgeon: [name], MD;  Location: [place];  Service: Endoscopy;  Laterality: N/A;   CORONARY ANGIOPLASTY WITH STENT PLACEMENT  10/31/2011   30 % MID ATRIOVENTRICULAR GROOVE CX STENOSIS. 90 % MID RCA.PTA TO LAD/STENTING OF TANDEM 60-70%. LAD STENOSIS 99% REDUCED TO 0%.3.0 X 32MM PROMUS DES POSTDILATED TO 3.25MM.   CORONARY ANGIOPLASTY WITH STENT PLACEMENT  07/03/2014   "2"   CORONARY STENT INTERVENTION N/A 04/09/2019   Procedure: CORONARY STENT INTERVENTION;  Surgeon: Jettie Booze, MD;  Location: [place] [facility];  Service: Cardiovascular;  Laterality: N/A;   CORONARY STENT INTERVENTION N/A 08/22/2019   Procedure: CORONARY STENT INTERVENTION;  Surgeon: Martinique, Peter M, MD;  Location: [place] [facility];  Service: Cardiovascular;  Laterality: N/A;   ESOPHAGOGASTRODUODENOSCOPY (EGD) WITH PROPOFOL N/A 03/21/2018   Procedure: ESOPHAGOGASTRODUODENOSCOPY (EGD) WITH PROPOFOL;  Surgeon: [name], MD;  Location: [facility];  Service: Endoscopy;  Laterality: N/A;   I & D EXTREMITY Right 10/30/2013   Procedure: IRRIGATION AND DEBRIDEMENT RIGHT SMALL FINGER POSSIBLE SECONDARY WOUND CLOSURE;  Surgeon: [name], MD;  Location: [facility];  Service: Orthopedics;  Laterality: Right;  [name] is available after 4pm   I & D EXTREMITY Right 11/01/2013   Procedure:  IRRIGATION AND DEBRIDEMENT RIGHT  PROXIMAL  PHALANGEAL LEVEL AMPUTATION;  Surgeon: [name], MD;  Location: [facility];  Service: Orthopedics;  Laterality: Right;   INCISION AND DRAINAGE Right 10/28/2013   Procedure: INCISION AND DRAINAGE Right small finger;  Surgeon: [name], MD;  Location: [facility];  Service: Orthopedics;  Laterality: Right;   LEFT HEART CATH Left 11/03/2011   Procedure: LEFT HEART CATH;  Surgeon: [name], MD;  Location: [facility] CATH LAB;  Service: Cardiovascular;  Laterality: Left;   LEFT HEART CATH AND CORONARY ANGIOGRAPHY N/A 04/09/2019   Procedure: LEFT HEART CATH AND CORONARY ANGIOGRAPHY;  Surgeon: Jettie Booze, MD;  Location: [place] [facility];  Service: Cardiovascular;  Laterality: N/A;   LEFT HEART CATHETERIZATION WITH CORONARY ANGIOGRAM N/A 07/03/2014   Procedure: LEFT HEART CATHETERIZATION WITH CORONARY ANGIOGRAM;  Surgeon: [name], MD;  Location[facility] CATH LAB;  Service: Cardiovascular;  Laterality: N/A;   PERCUTANEOUS CORONARY STENT INTERVENTION (PCI-S) Left 11/03/2011   Procedure: PERCUTANEOUS CORONARY STENT INTERVENTION (PCI-S);  Surgeon: [name], MD;  Location: [facility] CATH LAB;  Service: Cardiovascular;  Laterality: Left;   RIGHT/LEFT HEART CATH AND CORONARY ANGIOGRAPHY N/A 08/22/2019   Procedure: RIGHT/LEFT HEART CATH AND CORONARY ANGIOGRAPHY;  Surgeon: Martinique, Peter M, MD;  Location: [place] [facility] LAB;  Service: Cardiovascular;  Laterality: N/A;   TOE AMPUTATION Right ~ 2010   "2nd toe"   TOE AMPUTATION Right 2012   "3rd toe"   Social History   Occupational History    Comment: disabled  Tobacco Use   Smoking status: Never   Smokeless tobacco: Never  Vaping Use   Vaping Use: Never used  Substance and Sexual Activity   Alcohol use: Yes    Comment: social    Drug use: No   Sexual activity: Yes    Partners: Female

## 2021-11-04 ENCOUNTER — Ambulatory Visit: Payer: Medicare Other | Admitting: Orthopaedic Surgery

## 2021-11-16 DIAGNOSIS — E785 Hyperlipidemia, unspecified: Secondary | ICD-10-CM | POA: Diagnosis not present

## 2021-11-18 DIAGNOSIS — B353 Tinea pedis: Secondary | ICD-10-CM | POA: Diagnosis not present

## 2021-11-18 DIAGNOSIS — E1043 Type 1 diabetes mellitus with diabetic autonomic (poly)neuropathy: Secondary | ICD-10-CM | POA: Diagnosis not present

## 2021-11-27 ENCOUNTER — Other Ambulatory Visit: Payer: Self-pay | Admitting: Neurology

## 2021-11-30 DIAGNOSIS — D631 Anemia in chronic kidney disease: Secondary | ICD-10-CM | POA: Diagnosis not present

## 2021-11-30 DIAGNOSIS — N183 Chronic kidney disease, stage 3 unspecified: Secondary | ICD-10-CM | POA: Diagnosis not present

## 2021-11-30 DIAGNOSIS — N189 Chronic kidney disease, unspecified: Secondary | ICD-10-CM | POA: Diagnosis not present

## 2021-11-30 DIAGNOSIS — E7800 Pure hypercholesterolemia, unspecified: Secondary | ICD-10-CM | POA: Diagnosis not present

## 2021-11-30 DIAGNOSIS — I5032 Chronic diastolic (congestive) heart failure: Secondary | ICD-10-CM | POA: Diagnosis not present

## 2021-11-30 DIAGNOSIS — N184 Chronic kidney disease, stage 4 (severe): Secondary | ICD-10-CM | POA: Diagnosis not present

## 2021-11-30 DIAGNOSIS — N2581 Secondary hyperparathyroidism of renal origin: Secondary | ICD-10-CM | POA: Diagnosis not present

## 2021-11-30 DIAGNOSIS — I129 Hypertensive chronic kidney disease with stage 1 through stage 4 chronic kidney disease, or unspecified chronic kidney disease: Secondary | ICD-10-CM | POA: Diagnosis not present

## 2021-12-07 ENCOUNTER — Other Ambulatory Visit: Payer: Self-pay | Admitting: Cardiovascular Disease

## 2021-12-10 ENCOUNTER — Encounter: Payer: Self-pay | Admitting: Internal Medicine

## 2021-12-10 ENCOUNTER — Other Ambulatory Visit: Payer: Self-pay

## 2021-12-10 ENCOUNTER — Ambulatory Visit (INDEPENDENT_AMBULATORY_CARE_PROVIDER_SITE_OTHER): Payer: Medicare Other | Admitting: Internal Medicine

## 2021-12-10 VITALS — BP 128/80 | HR 68 | Ht 69.0 in | Wt 295.6 lb

## 2021-12-10 DIAGNOSIS — E1159 Type 2 diabetes mellitus with other circulatory complications: Secondary | ICD-10-CM | POA: Diagnosis not present

## 2021-12-10 DIAGNOSIS — E1165 Type 2 diabetes mellitus with hyperglycemia: Secondary | ICD-10-CM | POA: Diagnosis not present

## 2021-12-10 DIAGNOSIS — E785 Hyperlipidemia, unspecified: Secondary | ICD-10-CM

## 2021-12-10 NOTE — Patient Instructions (Addendum)
Please use the following regimen: - Basaglar 30 units a day 2x a day - Humalog 15-20 units 15 minutes before meals - Ozempic 1 mg weekly  Check sugars 4x a day.  Try to get the CGM. Try [employer] first.   The most common suppliers for the continuous glucose monitor are:  [place] [employer]: [place]: [phone] Ext [phone] [employer]: [place]: [place]: [phone] [place]: [phone]  Please come back for a follow-up appointment in 1.5 months.

## 2021-12-10 NOTE — Progress Notes (Signed)
[name] ID: Joshua [name], male   DOB: 1955/12/26, 65 y.o.   MRN: [id number]   This visit occurred during the SARS-CoV-2 public health emergency.  Safety protocols were in place, including screening questions prior to the visit, additional usage of staff PPE, and extensive cleaning of exam room while observing appropriate contact time as indicated for disinfecting solutions.   HPI: Joshua [name] is a 65 y.o.-year-old male, initially referred by his PCP, Dr. Alyson Ingles, returning for follow-up for DM2, dx in 1990s, insulin-dependent, uncontrolled, with complications (CAD - s/p AMI 2012, s/p PCIs, ischemic cardiomyopathy, CHF, CKD stage 4, PN - s/p R transmetatarsal amputation, gastroparesis, DR, history of DKA). He saw Dr Dwyane Dee initially, then Dr. Chalmers Cater for 20 years, but she was not accepting Medicaid patients anymore so he had to switch providers.  Last visit with me 1.5 months ago. He now has [employer].  Interim history: No increased urination, blurry vision, nausea, chest pain. He has decreased appetite and taste change.  As of now, he has cough and phlegm. He was in contact with his son who has Covid.  He did not test himself.  He has back pain.  He saw orthopedics.  He does not need surgery.  At last visit, he was in physical therapy.  He has a 64 oz fluid restriction as he has leg swelling.  Reviewed HbA1c levels: He continues to have back pain.  He saw orthopedics in the past Lab Results  Component Value Date   HGBA1C 11.2 (A) 10/28/2021   HGBA1C 9.2 (H) 08/11/2021   HGBA1C 9.1 (A) 06/17/2021   HGBA1C 8.3 (A) 02/10/2021   HGBA1C 7.5 (A) 08/15/2020   HGBA1C 8.9 (A) 05/15/2020   HGBA1C 13.8 (A) 01/28/2020   HGBA1C 10.2 (H) 08/16/2019   HGBA1C 15.0 (H) 04/07/2019   HGBA1C >15.5 (H) 03/15/2018   He was on: - Lantus 20 units 2x a day - NovoLog 10 units 3x a day, before meals Prev. On 70/30 until Spring 2020.  Currently on: - Basaglar 30 >> 20-25 >> 30 units a  day 2x a day - NovoLog >> Humalog 10-15 >> 10-20 >> 10-12 >> 18 >> 15-20 units before meals - misses many doses -  Ozempic 1 mg weekly  He is still not checking sugars.  He only has 1 blood sugar check in the last 2 weeks, at bedtime, HI. previously: - am: 163, 290, 329 >> 84 >> n/c >> 120, 171 - 2h after b'fast: n/c - before lunch: 140-180 >> 99, 134, 191-216 - 2h after lunch: n/c >> 274 >> n/c >> 304 - before dinner: n/c >> 134, 217 >> 151 >> n/c - 2h after dinner: n/c >> 145 - bedtime: 215- 500s >> 189, 268, 309  - nighttime: n/c >> HI >> 73-215 >> 121 Lowest sugar was 50 >> ...>> 60s (took insulin after he ate) >> 99 >> ?; he has hypoglycemia awareness at 100.  Highest sugar was 538 >> 389 >> ?.   Glucometer: ReliOn  Pt's meals are: - Breakfast: skips - Lunch: may skip, PB or chicken - Dinner: chicken w/o sides or with + veggies + starch;  - Snacks: occasional sweets: Hershey bar, sweet tea, cool aid  -+ CKD stage IV- Dr [name], last BUN/creatinine:  Lab Results  Component Value Date   BUN 33 (H) 10/26/2021   BUN 39 (H) 08/11/2021   CREATININE 2.55 (H) 10/26/2021   CREATININE 2.44 (H) 08/11/2021  10/18/2019: 24/2.92 10/18/2019: ACR 765.3  Lab Results  Component Value Date   GFRAA 27 (L) 08/23/2019   GFRAA 25 (L) 08/22/2019   GFRAA 24 (L) 08/21/2019   GFRAA 22 (L) 08/20/2019   GFRAA 29 (L) 08/19/2019  Not on ACE inhibitor/ARB  -+ HL; last set of lipids: Lab Results  Component Value Date   CHOL 283 (H) 08/11/2021   HDL 89 08/11/2021   LDLCALC 169 (H) 08/11/2021   TRIG 145 08/11/2021   CHOLHDL 2.0 08/17/2019  09/05/2020: 112/137/38/49 10/18/2019: 143/105/53/69 On Crestor 40 - restarted 07/2021.  Previously on Zetia, also.  - last eye exam was in 02/2021: + DR. Had cataract sx in 2014, 2015.  -+ numbness in his feet.  He also has significant back pain.  He sees neurology for peripheral neuropathy, leg pain, instability.  Now on gabapentin.  On ASA 81.  Latest  TSH:  Lab Results  Component Value Date   TSH 2.890 08/11/2021   Pt has FH of DM in [place].  He also has a history of HTN, OSA, GERD, esophageal ulcers.  No h/o pancreatitis or FH of MTC.  He was in a play in [place].  ROS: See HPI  I reviewed pt's medications, allergies, PMH, social hx, family hx, and changes were documented in the history of present illness. Otherwise, unchanged from my initial visit note.  Past Medical History:  Diagnosis Date   Acute coronary syndrome (HCC)    Anemia    B12 deficiency    Chest pain    CHF (congestive heart failure) (HCC)    Chronic combined systolic and diastolic CHF (congestive heart failure) ([place])    a. 07/2015 Echo: EF 45-50%, mod LVH, mid apical/antsept AK, Gr 1 DD.   Chronic low back pain    CKD (chronic kidney disease), stage III (HCC)    Constipation    COPD (chronic obstructive pulmonary disease) (HCC)    Coronary artery disease    a. 2012 s/p MI/PCI: s/p DES to mid/dist RCA and overlapping DES to LAD;  b. 07/2015 Cath: LAD 10% ISR, D1 40(jailed), LCX 36m OM2 30, OM3 30, RCA 553mSR, 10d ISR; c. 07/2016 NSTEMI/PCI: LAD 85ost/p/m (3.5x20 Synergy DES overlapping prior stent), D1 40, LCX 7534mM2 95 (2.75x16 Synergy DES), OM3 30, RCA 47m2md.   Depression    Diabetic gastroparesis (HCC)[place] Diabetic polyneuropathy (HCC)[place] DKA (diabetic ketoacidoses) 03/14/2018   GERD (gastroesophageal reflux disease)    Gout    Heart failure (HCC)    Heart murmur    History of MI (myocardial infarction)    Hyperlipemia    Hypertension    Hypertensive heart disease    Hypokalemia    Ischemic cardiomyopathy    a. 07/2015 Echo: EF 45-50%, mod LVH, mid-apicalanteroseptal AK, Gr1 DD.   Joint pain    Kidney problem    Morbid obesity (HCC)[place] OSA on CPAP    Osteoarthritis    Osteomyelitis (HCC)[place] a. 07/2016 MTP joint of R great toe s/p transmetatarsal amputation.   Other fatigue    Polyneuropathy in diabetes(357.2)    Pulmonary sarcoidosis  (HCC)[place] "problems w/it years ago; no problems anymore" (07/03/2014)   Rectal bleeding    Shortness of breath    Shortness of breath on exertion    Sleep apnea    Stomach ulcer    Swallowing difficulty    Type II diabetes mellitus (HCC)[place] Past Surgical History:  Procedure  Laterality Date   AMPUTATION Right 07/24/2016   Procedure: TRANSMETATARSAL FOOT AMPUTATION;  Surgeon: [name], MD;  Location: [place];  Service: Orthopedics;  Laterality: Right;   BALLOON DILATION N/A 03/21/2018   Procedure: BALLOON DILATION;  Surgeon: [name], MD;  Location: [facility] ENDOSCOPY;  Service: Endoscopy;  Laterality: N/A;   BREAST LUMPECTOMY Right ~ 2000   "benign"   CARDIAC CATHETERIZATION N/A 07/30/2015   Procedure: Left Heart Cath and Coronary Angiography;  Surgeon: Burnell Blanks, MD;  Location: [place] CV LAB;  Service: Cardiovascular;  Laterality: N/A;   CARDIAC CATHETERIZATION N/A 07/19/2016   Procedure: Left Heart Cath and Coronary Angiography;  Surgeon: Belva Crome, MD;  Location: [place] CV LAB;  Service: Cardiovascular;  Laterality: N/A;   CARDIAC CATHETERIZATION N/A 07/29/2016   Procedure: Coronary Stent Intervention;  Surgeon: Belva Crome, MD;  Location: [place] CV LAB;  Service: Cardiovascular;  Laterality: N/A;   CARDIAC CATHETERIZATION N/A 07/29/2016   Procedure: Intravascular Ultrasound/IVUS;  Surgeon: Belva Crome, MD;  Location: [place] [facility];  Service: Cardiovascular;  Laterality: N/A;   CATARACT EXTRACTION W/ INTRAOCULAR LENS  IMPLANT, BILATERAL Bilateral 2014-2015   COLONOSCOPY WITH PROPOFOL N/A 08/22/2017   Procedure: COLONOSCOPY WITH PROPOFOL;  Surgeon: [name], MD;  Location: [place];  Service: Endoscopy;  Laterality: N/A;   CORONARY ANGIOPLASTY WITH STENT PLACEMENT  10/31/2011   30 % MID ATRIOVENTRICULAR GROOVE CX STENOSIS. 90 % MID RCA.PTA TO LAD/STENTING OF TANDEM 60-70%. LAD STENOSIS 99% REDUCED TO 0%.3.0 X 32MM PROMUS DES POSTDILATED TO  3.25MM.   CORONARY ANGIOPLASTY WITH STENT PLACEMENT  07/03/2014   "2"   CORONARY STENT INTERVENTION N/A 04/09/2019   Procedure: CORONARY STENT INTERVENTION;  Surgeon: Jettie Booze, MD;  Location: [place] CV LAB;  Service: Cardiovascular;  Laterality: N/A;   CORONARY STENT INTERVENTION N/A 08/22/2019   Procedure: CORONARY STENT INTERVENTION;  Surgeon: Martinique, Peter M, MD;  Location: [place] CV LAB;  Service: Cardiovascular;  Laterality: N/A;   ESOPHAGOGASTRODUODENOSCOPY (EGD) WITH PROPOFOL N/A 03/21/2018   Procedure: ESOPHAGOGASTRODUODENOSCOPY (EGD) WITH PROPOFOL;  Surgeon: [name], MD;  Location: [place];  Service: Endoscopy;  Laterality: N/A;   I & D EXTREMITY Right 10/30/2013   Procedure: IRRIGATION AND DEBRIDEMENT RIGHT SMALL FINGER POSSIBLE SECONDARY WOUND CLOSURE;  Surgeon: [name], MD;  Location: [facility];  Service: Orthopedics;  Laterality: Right;  [name] is available after 4pm   I & D EXTREMITY Right 11/01/2013   Procedure:  IRRIGATION AND DEBRIDEMENT RIGHT  PROXIMAL PHALANGEAL LEVEL AMPUTATION;  Surgeon: [name], MD;  Location: [facility];  Service: Orthopedics;  Laterality: Right;   INCISION AND DRAINAGE Right 10/28/2013   Procedure: INCISION AND DRAINAGE Right small finger;  Surgeon: [name], MD;  Location: [facility];  Service: Orthopedics;  Laterality: Right;   LEFT HEART CATH Left 11/03/2011   Procedure: LEFT HEART CATH;  Surgeon: [name], MD;  [facility] C[facility] LAB;  Service: Cardiovascular;  Laterality: Left;   LEFT HEART CATH AND CORONARY ANGIOGRAPHY N/A 04/09/2019   Procedure: LEFT HEART CATH AND CORONARY ANGIOGRAPHY;  Surgeon: Jettie Booze, MD;  Location: [place] CV LAB;  Service: Cardiovascular;  Laterality: N/A;   LEFT HEART CATHETERIZATION WITH CORONARY ANGIOGRAM N/A 07/03/2014   Procedure: LEFT HEART CATHETERIZATION WITH CORONARY ANGIOGRAM;  Surgeon: [name], MD;  Location: [facility] CATH LAB;  Service:  Cardiovascular;  Laterality: N/A;   PERCUTANEOUS CORONARY STENT INTERVENTION (PCI-S) Left  11/03/2011   Procedure: PERCUTANEOUS CORONARY STENT INTERVENTION (PCI-S);  Surgeon: [name], MD;  [facility] CATH LAB;  Service: Cardiovascular;  Laterality: Left;   RIGHT/LEFT HEART CATH AND CORONARY ANGIOGRAPHY N/A 08/22/2019   Procedure: RIGHT/LEFT HEART CATH AND CORONARY ANGIOGRAPHY;  Surgeon: Martinique, Peter M, MD;  Location: [place] CV LAB;  Service: Cardiovascular;  Laterality: N/A;   TOE AMPUTATION Right ~ 2010   "2nd toe"   TOE AMPUTATION Right 2012   "3rd toe"   Social History   Socioeconomic History   Marital status: Married    Spouse name: Dorian Pod   Number of children: 3   Years of education: Not on file   Highest education level: Not on file  Occupational History    Comment: disabled  Tobacco Use   Smoking status: Never   Smokeless tobacco: Never  Vaping Use   Vaping Use: Never used  Substance and Sexual Activity   Alcohol use: Yes    Comment: social    Drug use: No   Sexual activity: Yes    Partners: Female  Other Topics Concern   Not on file  Social History Narrative   Not on file   Social Determinants of Health   Financial Resource Strain: Not on file  Food Insecurity: Not on file  Transportation Needs: Not on file  Physical Activity: Not on file  Stress: Not on file  Social Connections: Not on file  Intimate Partner Violence: Not on file   Current Outpatient Medications on File Prior to Visit  Medication Sig Dispense Refill   Accu-Chek FastClix Lancets MISC 1 each by Other route in the morning, at noon, in the evening, and at bedtime.      ACCU-CHEK GUIDE test strip USE UP TO FOUR TIMES DAILY AS DIRECTED (Patient taking differently: 1 each by Other route See admin instructions. USE UP TO FOUR TIMES DAILY AS DIRECTED) 300 each 3   acetaminophen (TYLENOL) 650 MG CR tablet Take 1,300 mg by mouth daily as needed for pain.     allopurinol (ZYLOPRIM) 300 MG  tablet Take 300 mg by mouth daily.      aspirin EC 81 MG tablet Take 81 mg by mouth daily.      blood glucose meter kit and supplies Dispense based on patient and insurance preference. Use up to four times daily as directed. (FOR ICD-10 E10.9, E11.9). (Patient taking differently: 1 each by Other route See admin instructions. Dispense based on patient and insurance preference. Use up to four times daily as directed. (FOR ICD-10 E10.9, E11.9).) 1 each 0   BRILINTA 90 MG TABS tablet Take 1 tablet by mouth twice daily 180 tablet 0   Continuous Blood Gluc Receiver (FREESTYLE LIBRE 2 READER) DEVI 1 each by Does not apply route daily. 1 each 0   Continuous Blood Gluc Sensor (FREESTYLE LIBRE 2 SENSOR) MISC 1 each by Does not apply route every 14 (fourteen) days. 6 each 3   famotidine (PEPCID) 20 MG tablet Take 20 mg by mouth at bedtime.     furosemide (LASIX) 80 MG tablet Take 160 mg twice daily for 1 week; then go back to 80 mg twice daily. 180 tablet 1   gabapentin (NEURONTIN) 100 MG capsule Take 1 capsule (100 mg total) by mouth at bedtime. 30 capsule 0   insulin glargine (LANTUS SOLOSTAR) 100 UNIT/ML Solostar Pen Inject 30 Units into the skin 2 (two) times daily. 45 mL 3   insulin lispro (HUMALOG KWIKPEN) 200 UNIT/ML  KwikPen Inject 10-18 Units into the skin 3 (three) times daily before meals. 45 mL 3   isosorbide mononitrate (IMDUR) 60 MG 24 hr tablet Take 3 tablets (180 mg total) by mouth daily. 90 tablet 3   Lidocaine HCl-Benzyl Alcohol (SALONPAS LIDOCAINE PLUS) 4-10 % CREA Apply 1 patch topically as needed.     metoprolol succinate (TOPROL-XL) 25 MG 24 hr tablet Take 25 mg by mouth daily. Take 3 tablets (90m total) by mouth in the morning and 2 tablets (555mtotal) in the evening.     nitroGLYCERIN (NITROSTAT) 0.4 MG SL tablet Place 1 tablet (0.4 mg total) under the tongue every 5 (five) minutes as needed for chest pain. 25 tablet 1   pantoprazole (PROTONIX) 40 MG tablet TAKE 1 TABLET BY MOUTH TWICE  DAILY BEFORE MEAL(S) 180 tablet 0   rosuvastatin (CRESTOR) 20 MG tablet Take 20 mg by mouth daily.     Semaglutide, 1 MG/DOSE, (OZEMPIC, 1 MG/DOSE,) 4 MG/3ML SOPN Inject 1 mg into the skin once a week. 9 mL 3   tretinoin (RETIN-A) 0.1 % cream Apply topically daily.     Vitamin D, Ergocalciferol, (DRISDOL) 1.25 MG (50000 UNIT) CAPS capsule Take 1 capsule (50,000 Units total) by mouth every 7 (seven) days. 4 capsule 0   No current facility-administered medications on file prior to visit.   Allergies  Allergen Reactions   Chlorthalidone Other (See Comments)    Causes dehydration, kidneys shut down   Family History  Problem Relation Age of Onset   Sudden death Mother    Diabetes Maternal Grandmother    Hypertension Maternal Grandmother    Heart attack Maternal Aunt    Sudden death Other     PE: BP 128/80 (BP Location: Right Arm, Patient Position: Sitting, Cuff Size: Normal)    Pulse 68    Ht '5\' 9"'  (1.753 m)    Wt 295 lb 9.6 oz (134.1 kg)    SpO2 99%    BMI 43.65 kg/m  Wt Readings from Last 3 Encounters:  12/10/21 295 lb 9.6 oz (134.1 kg)  11/03/21 (!) 323 lb 12.8 oz (146.9 kg)  10/28/21 (!) 324 lb (147 kg)   Constitutional: overweight, in NAD Eyes: PERRLA, EOMI, no exophthalmos ENT: moist mucous membranes, no thyromegaly, no cervical lymphadenopathy Cardiovascular: RRR, No MRG, + LE edema Respiratory: CTA B Musculoskeletal: + Deformities: Right transmetatarsal amputation, strength intact in all 4 Skin: moist, warm, no rashes Neurological: no tremor with outstretched hands, DTR normal in all 4  ASSESSMENT: 1. DM2, insulin-dependent, uncontrolled, with complications - CAD - s/p AMI 2012, s/p multiple PCI's; ischemic cardiomyopathy; CHF. Dr. KeClaiborne Billings CKD stage 4 - DR - PN - s/p R transmetatarsal amputation - Gastroparesis - History of DKA  PLAN:  1. Patient with longstanding, uncontrolled, type 2 diabetes, on basal-bolus insulin regimen and GLP-1 receptor agonist, with much  worsening control at last visit-HbA1c 11.2%, increased from 9.1%.  At that time, he was not checking blood sugars -therefore, we will not change his regimen. He was able to switch from Victoza to OzDamonefore last visit, though.  We discussed about the importance of checking blood sugars and also bolusing for meals.  He mentions that he was only eating once a day and he was bolusing approximately 18 units of Humalog for this meal.  We discussed about spreading the calories throughout the day and bolus for them.  I strongly advised him to continue to see the weight management clinic but he did not  return to see them.  I also suggested a freestyle libre CGM and sent a prescription to his pharmacy. -At today's visit, he returns after being in contact with his son who had [place].  The staff and I wore an N95 masks and patient was also masked.  He was not coughing during the appointment.  I advised him to check himself at home and contact PCP if he is positive.  He does mention that he has been feeling poorly for at least a month.  He has not been checking blood sugars but he checked once in the last 2 weeks and the CBG was undetectably high.  He is not taking Humalog consistently as he mentions that he does not eat.  He also did not obtain the CGM that I recommended at last visit.  At this visit, we discussed that if he is not checking blood sugars or take the insulin as advised, I would not be able to see him since I cannot help him.  I gave him contact information for different suppliers for the CGM and advised him to call and establish an account with them.  He is getting his CPAP supplies from [employer] so I advised him to contact them first.  We change the regimen at this time.  However, he needs to improve compliance with the current regimen, since this was working well for him in the past. - I suggested to:  Patient Instructions  Please use the following regimen: - Basaglar 30 units a day 2x a day -  Humalog 15-20 units 15 minutes before meals - Ozempic 1 mg weekly  Check sugars 4x a day.  Try to get the CGM. Try [employer] first.   The most common suppliers for the continuous glucose monitor are:  [employer]: [place]: [phone] [employer]: [place]: [place]: [phone] [place]: [phone]  Please come back for a follow-up appointment in 1.5 months.  - advised to check sugars at different times of the day - 4x a day, rotating check times - advised for yearly eye exams >> he is UTD - return to clinic in 3 months  2. HL -Reviewed latest lipid panel from 07/2021: LDL very high-she was off Crestor at that time: Lab Results  Component Value Date   CHOL 283 (H) 08/11/2021   HDL 89 08/11/2021   LDLCALC 169 (H) 08/11/2021   TRIG 145 08/11/2021   CHOLHDL 2.0 08/17/2019  -He continues on Crestor 40 mg daily, restarted in 07/2021, without side effects  3.  Obesity class III -He has large fluctuations in his weight, possibly contributed to by fluid status -I referred him to nutrition in the past that he was working with Antonieta Iba on improving his diet.  Barriers to improve nutrition: Increased beer intake, eating takeout frequently, sleeping only 5 hours a night -She is now on Ozempic, which should also help with weight loss -At last visit, he gained 22 pounds since the previous visit -Before last visit he started to see the weight management clinic.  He is not seeing them anymore... -Since last visit he lost 28 pounds.  It is unclear how much weight is related to uncontrolled diabetes, though.  Philemon Kingdom, MD PhD [facility] Endocrinology

## 2021-12-17 ENCOUNTER — Encounter: Payer: Self-pay | Admitting: Internal Medicine

## 2021-12-28 ENCOUNTER — Encounter: Payer: Medicare Other | Attending: Internal Medicine | Admitting: Dietician

## 2021-12-28 ENCOUNTER — Other Ambulatory Visit: Payer: Self-pay

## 2021-12-28 ENCOUNTER — Encounter: Payer: Self-pay | Admitting: Dietician

## 2021-12-28 DIAGNOSIS — N184 Chronic kidney disease, stage 4 (severe): Secondary | ICD-10-CM | POA: Diagnosis not present

## 2021-12-28 DIAGNOSIS — E1142 Type 2 diabetes mellitus with diabetic polyneuropathy: Secondary | ICD-10-CM | POA: Diagnosis not present

## 2021-12-28 NOTE — Progress Notes (Signed)
Medical Nutrition Therapy  Appointment Start time:  [phone]  Appointment End time:  0910 Patient is here today alone. He was last seen by this RD on 09/21/2021.  Primary concerns today: follow up  Referral diagnosis: Type 2 Diabetes Preferred learning style: no preference indicated Learning readiness: contemplating   -Weight has decreased from 324 lbs to 304 lbs. -Fluid retention better since October. -States his blood pressure is "great" -States that he probably had COVID about 2 weeks ago -He has had no appetite through the holidays but states that it has improved.  He is not craving food as he did in the past. -He states that his blood glucose has been very high until the last week. -A1C 11.2% 10/28/2021 -He has begun taking the Humalog in the morning even if he does not eat breakfast as he realized that he needs this for a correction dose.  -Encouraged him to eat breakfast. -He is not taking Ozempic correctly and was just turning it to one of the first "tick" marks.  Showed patient a demo pen.  He is to start taking at the starting dose and work up to goal. -No exercise. November-recent struggled with walking due to getting very out of breath and back pain  -Difficulty checking blood sugar as he is having problems getting enough blood.  Mike Craze has been ordered.  [place] and worked through more of the "paperwork" to get this for patient.  He is to call for training when it arrives.  -Financially, things are still difficult but his son is helping a lot financially and is getting a food card from [place] are now encouraging him to eat better and decrease beer.  NUTRITION ASSESSMENT  See above  Anthropometrics  304 lbs 12/28/2021 324 lbs 09/21/2021 322 lbs 07/26/2021 312 lbs 07/21/2021 302 lbs 06/17/2021 314 lbs 05/25/2021 311 lbs 03/02/2021  "weight fluctuates 10 lbs - fluid retention") 311 lbs 12/22/2020 per patient at another MD 293 lbs 08/26/2020 297 lbs  07/21/2020 300 lbs 06/03/2020 309 lbs 05/15/2020 (increased due to fluid) 306 lbs 05/06/2020 312 lbs 04/10/2020 (not eating much, presumed fluid gain based on examination) 300 lbs 03/06/2020 319 lbs 01/28/20 304 lbs 04/2019 323 lbs about 02/14/20 highest resent weight 195 lbs lowest adult weight 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 eGFR 28, BUN 33, Creatinine 2.55, Potassium 3.5 on 10/28/2021 Notable Signs/Symptoms: none today.  Walking improved  Lifestyle & Dietary Hx Patient lives with his wife and three sons.  His son is paying the mortgage and helps out a lot with finances.  His wife has mobility issus and has paperwork to complete disability.  Shopping causes him increased physical pain.  He finds it difficult to meet his own nutritional needs as well as the preferences of his family and overall is not cooking.  States that his son's now frequently get take out.  His son helps with the bills but is getting married soon. Rely on food assistance as needed. 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 Middle son has type 2 diabetes and recently diagnosed.   Estimated daily fluid intake: States MD stated to  limit to 2 Liters daily.    Supplements: vitamin D Sleep: poor quality "not deep:, only about 5 hours at night.  C-pap is working better. Stress / self-care: financial, disability of wife, difficulty with stress Current average weekly physical activity: none- see above   24-Hr Dietary Recall First Meal: usually skips Snack: none Second Meal: ham biscuit, Bo Rounds, 1% milk OR 2 slices pizza Snack: pear Third Meal: [place]  beans OR cheddar cheese and ritz crackers Snack: 3 slices of cheddar cheese, apple Beverages: water, unsweetened tea with splenda, sprite zero    NUTRITION DIAGNOSIS  NB-1.1 Food and nutrition-related knowledge deficit As related to balance of carbohydrate, protein, and fat.  As evidenced by diet hx and patient report.     NUTRITION INTERVENTION  Nutrition education (E-1) on the following topics:  Ozempic instruction Humalog use for correction of high blood sugar and importance of consistent meals so this Humalog can be given more consistently Importance of controlling blood glucose and blood pressure for kidney health Low sodium Adequate fluid Increasing vegetables Decreasing fast food   Handouts Provided Include (previous visit) Low sodium nutrition therapy from [place] [facility] Teaching method utilized: Visual & Auditory  Demonstrated degree of understanding via: Teach Back  Barriers to learning/adherence to lifestyle change: finances, difficulty cooking   Goals Call me when you get the [place] for training. Call [facility] [phone] if you have not received the [place] in a week or 2.  Continue to take your medication as prescribed. You have not been taking the Ozempic correctly.  Start as below.  0.25 mg once per week for 4 weeks then  0.5 mg once per week for 4 weeks then  0.75 mg once per week for 4 weeks then  1 mg once per week (goal) Consistent meals Decrease fast food Low sodium (avoid processed meat, added salt) Increase vegetables More water   MONITORING & EVALUATION Dietary intake, weekly physical activity, and label reading in 2-3 months.   Next Steps  Patient is to call as needed.

## 2021-12-28 NOTE — Patient Instructions (Addendum)
Call me when you get the [facility] for training. Call [facility] [phone] if you have not received the [place] in a week or 2.  Continue to take your medication as prescribed. You have not been taking the Ozempic correctly.  Start as below.  0.25 mg once per week for 4 weeks then  0.5 mg once per week for 4 weeks then  0.75 mg once per week for 4 weeks then  1 mg once per week (goal) Consistent meals Decrease fast food Low sodium (avoid processed meat, added salt) Increase vegetables More water

## 2021-12-31 ENCOUNTER — Telehealth: Payer: Self-pay

## 2021-12-31 NOTE — Telephone Encounter (Signed)
Detailed written order for CGM Joshua [name] has been signed by provider and faxed. Office notes attached.  Fax #: [phone]

## 2022-01-05 DIAGNOSIS — H40053 Ocular hypertension, bilateral: Secondary | ICD-10-CM | POA: Diagnosis not present

## 2022-01-06 DIAGNOSIS — E1165 Type 2 diabetes mellitus with hyperglycemia: Secondary | ICD-10-CM | POA: Diagnosis not present

## 2022-01-06 NOTE — Telephone Encounter (Signed)
Order has now been refaxed to [phone]

## 2022-01-06 NOTE — Telephone Encounter (Signed)
Please refax to the following number: [phone]

## 2022-01-11 ENCOUNTER — Telehealth: Payer: Self-pay

## 2022-01-11 ENCOUNTER — Other Ambulatory Visit: Payer: Self-pay | Admitting: Gastroenterology

## 2022-01-11 DIAGNOSIS — R1319 Other dysphagia: Secondary | ICD-10-CM | POA: Diagnosis not present

## 2022-01-11 DIAGNOSIS — Z7901 Long term (current) use of anticoagulants: Secondary | ICD-10-CM | POA: Diagnosis not present

## 2022-01-11 DIAGNOSIS — K5900 Constipation, unspecified: Secondary | ICD-10-CM | POA: Diagnosis not present

## 2022-01-11 DIAGNOSIS — Z86010 Personal history of colonic polyps: Secondary | ICD-10-CM | POA: Diagnosis not present

## 2022-01-11 DIAGNOSIS — K219 Gastro-esophageal reflux disease without esophagitis: Secondary | ICD-10-CM | POA: Diagnosis not present

## 2022-01-11 NOTE — Telephone Encounter (Signed)
° °  Pre-operative Risk Assessment    Patient Name: Joshua [name]  DOB: [birth year] MRN: [id number]      Request for Surgical Clearance    Procedure:   ESOPHAGOGASTRODUODENOSCOPY (EGD)/ COLONOSCOPY  Date of Surgery:  Clearance 03/16/22                                 Surgeon:  DR Michail Sermon Surgeon's Group or Practice Name:  [facility] Phone number:  [phone] Fax number:  [phone]   Type of Clearance Requested:   - Pharmacy:  Hold Ticagrelor (Brilinta)     Type of Anesthesia:   PROPOFOL   Additional requests/questions:

## 2022-01-11 NOTE — Telephone Encounter (Signed)
Primary Cardiologist:Thomas Claiborne Billings, MD  Chart reviewed as part of pre-operative protocol coverage. Because of Joshua [name]'s past medical history and time since last visit, he/she will require a follow-up visit in order to better assess preoperative cardiovascular risk.  Pre-op covering staff: - Please schedule appointment and call patient to inform them. - Please contact requesting surgeon's office via preferred method (i.e, phone, fax) to inform them of need for appointment prior to surgery.  If applicable, this message will also be routed to pharmacy pool and/or primary cardiologist for input on holding anticoagulant/antiplatelet agent as requested below so that this information is available at time of patient's appointment.   Deberah Pelton, NP  01/11/2022, 4:55 PM

## 2022-01-12 NOTE — Telephone Encounter (Signed)
Patient returning call.

## 2022-01-12 NOTE — Telephone Encounter (Signed)
Pt has been scheduled to see Coletta Memos, NP, 02/26/22, clearance will be addressed at that time.  Will route back to the requesting surgeon's office to make them aware.

## 2022-01-12 NOTE — Telephone Encounter (Signed)
1st attempt to reach pt regarding surgical clearance and the need for an appointment. Left a message for pt to call back.

## 2022-01-21 ENCOUNTER — Ambulatory Visit (INDEPENDENT_AMBULATORY_CARE_PROVIDER_SITE_OTHER): Payer: Medicare Other | Admitting: Internal Medicine

## 2022-01-21 ENCOUNTER — Other Ambulatory Visit: Payer: Self-pay

## 2022-01-21 ENCOUNTER — Encounter: Payer: Self-pay | Admitting: Internal Medicine

## 2022-01-21 VITALS — BP 128/82 | HR 86 | Ht 69.0 in | Wt 319.4 lb

## 2022-01-21 DIAGNOSIS — E1165 Type 2 diabetes mellitus with hyperglycemia: Secondary | ICD-10-CM | POA: Diagnosis not present

## 2022-01-21 DIAGNOSIS — M109 Gout, unspecified: Secondary | ICD-10-CM | POA: Diagnosis not present

## 2022-01-21 DIAGNOSIS — D631 Anemia in chronic kidney disease: Secondary | ICD-10-CM | POA: Diagnosis not present

## 2022-01-21 DIAGNOSIS — N184 Chronic kidney disease, stage 4 (severe): Secondary | ICD-10-CM | POA: Diagnosis not present

## 2022-01-21 DIAGNOSIS — I129 Hypertensive chronic kidney disease with stage 1 through stage 4 chronic kidney disease, or unspecified chronic kidney disease: Secondary | ICD-10-CM | POA: Diagnosis not present

## 2022-01-21 DIAGNOSIS — N2581 Secondary hyperparathyroidism of renal origin: Secondary | ICD-10-CM | POA: Diagnosis not present

## 2022-01-21 DIAGNOSIS — E785 Hyperlipidemia, unspecified: Secondary | ICD-10-CM

## 2022-01-21 DIAGNOSIS — I5032 Chronic diastolic (congestive) heart failure: Secondary | ICD-10-CM | POA: Diagnosis not present

## 2022-01-21 DIAGNOSIS — E1159 Type 2 diabetes mellitus with other circulatory complications: Secondary | ICD-10-CM | POA: Diagnosis not present

## 2022-01-21 DIAGNOSIS — N183 Chronic kidney disease, stage 3 unspecified: Secondary | ICD-10-CM | POA: Diagnosis not present

## 2022-01-21 DIAGNOSIS — E7800 Pure hypercholesterolemia, unspecified: Secondary | ICD-10-CM | POA: Diagnosis not present

## 2022-01-21 LAB — POCT GLYCOSYLATED HEMOGLOBIN (HGB A1C): Hemoglobin A1C: 12.9 % — AB (ref 4.0–5.6)

## 2022-01-21 NOTE — Patient Instructions (Addendum)
Please continue: - Basaglar 30 units a day 2x a day - Humalog 15-20 units 15 minutes before meals - Ozempic 1 mg weekly  The most common suppliers for the continuous glucose monitor are:  [place] [facility]: [place]: [phone] Ext [phone] [facility]: [place]: [place]: [phone] [place]: [phone]  Please come back for a follow-up appointment in 3 months.

## 2022-01-21 NOTE — Progress Notes (Signed)
[name] ID: Joshua [name], male   DOB: 1956-06-23, 66 y.o.   MRN: [id number]   This visit occurred during the SARS-CoV-2 public health emergency.  Safety protocols were in place, including screening questions prior to the visit, additional usage of staff PPE, and extensive cleaning of exam room while observing appropriate contact time as indicated for disinfecting solutions.   HPI: Joshua [name] is a 66 y.o.-year-old male, initially referred by his PCP, Dr. Alyson Ingles, returning for follow-up for DM2, dx in 1990s, insulin-dependent, uncontrolled, with complications (CAD - s/p AMI 2012, s/p PCIs, ischemic cardiomyopathy, CHF, CKD stage 4, PN - s/p R transmetatarsal amputation, gastroparesis, DR, history of DKA). He saw Dr Dwyane Dee initially, then Dr. Chalmers Cater for 20 years, but she was not accepting Medicaid patients anymore so he had to switch providers.  Last visit with me 1.5 months ago. He now has [employer].  Interim history: No increased urination, blurry vision, nausea, chest pain. He has a 64 oz fluid restriction as he has leg swelling. He has been out of Lasix and he feels that this is the reason why he gained weight since last visit.  She states she has nephrologist later today.  She has been changing her Lasix doses. He continues to have back pain for which she saw orthopedics.  He does not need surgery.  Reviewed HbA1c levels: He continues to have back pain.  He saw orthopedics in the past Lab Results  Component Value Date   HGBA1C 11.2 (A) 10/28/2021   HGBA1C 9.2 (H) 08/11/2021   HGBA1C 9.1 (A) 06/17/2021   HGBA1C 8.3 (A) 02/10/2021   HGBA1C 7.5 (A) 08/15/2020   HGBA1C 8.9 (A) 05/15/2020   HGBA1C 13.8 (A) 01/28/2020   HGBA1C 10.2 (H) 08/16/2019   HGBA1C 15.0 (H) 04/07/2019   HGBA1C >15.5 (H) 03/15/2018   He was on: - Lantus 20 units 2x a day - NovoLog 10 units 3x a day, before meals Prev. On 70/30 until Spring 2020.  Currently on: - Basaglar 30 >> 20-25 >>  30 units a day 2x a day -  Humalog 10-15 >> 10-20 >> 10-12 >> 18 >> 15-20 units before meals  -  Ozempic 1 mg weekly (started to take it correctly only after 12/29/2021 after he saw the diabetes educator)  He is now checking blood sugars 2-3x a day: - am: 163, 290, 329 >> 84 >> n/c >> 120, 171 >> 67, 100-157, 218 - 2h after b'fast: n/c >> 241 - before lunch: 140-180 >> 99, 134, 191-216 >> 115 - 2h after lunch: n/c >> 274 >> n/c >> 304 >> 190 - before dinner: n/c >> 134, 217 >> 151 >> n/c >> 201 - 2h after dinner: n/c >> 145 >> 275 - bedtime: 215- 500s >> 189, 268, 309 >> 92, 98, 235 - nighttime: n/c >> HI >> 73-215 >> 121 >> n/c Lowest sugar was 50 >> ...>> 60s (took insulin after he ate) >> 99 >> 60; he has hypoglycemia awareness at 100.  Highest sugar was 538 >> 389 >> 320.   Glucometer: ReliOn  Pt's meals are: - Breakfast: skips - Lunch: may skip, PB or chicken - Dinner: chicken w/o sides or with + veggies + starch;  - Snacks: occasional sweets: Hershey bar, sweet tea, cool aid  -+ CKD stage IV- Dr [name], last BUN/creatinine:  Lab Results  Component Value Date   BUN 33 (H) 10/26/2021   BUN 39 (H) 08/11/2021   CREATININE 2.55 (H) 10/26/2021  CREATININE 2.44 (H) 08/11/2021  10/18/2019: 24/2.92 10/18/2019: ACR 765.3  Lab Results  Component Value Date   GFRAA 27 (L) 08/23/2019   GFRAA 25 (L) 08/22/2019   GFRAA 24 (L) 08/21/2019   GFRAA 22 (L) 08/20/2019   GFRAA 29 (L) 08/19/2019  Not on ACE inhibitor/ARB  -+ HL; last set of lipids: Lab Results  Component Value Date   CHOL 283 (H) 08/11/2021   HDL 89 08/11/2021   LDLCALC 169 (H) 08/11/2021   TRIG 145 08/11/2021   CHOLHDL 2.0 08/17/2019  09/05/2020: 112/137/38/49 10/18/2019: 143/105/53/69 On Crestor 40 - restarted 07/2021.  Previously on Zetia, also.  - last eye exam was in 02/2021: + DR. Had cataract sx in 2014, 2015.  -+ numbness in his feet.  He also has significant back pain.  He sees neurology for peripheral  neuropathy, leg pain, instability.  Now on gabapentin.  On ASA 81.  Latest TSH:  Lab Results  Component Value Date   TSH 2.890 08/11/2021   Pt has FH of DM in [place].  He also has a history of HTN, OSA, GERD, esophageal ulcers.  No h/o pancreatitis or FH of MTC.  He was in a play in [place].  ROS: See HPI  I reviewed pt's medications, allergies, PMH, social hx, family hx, and changes were documented in the history of present illness. Otherwise, unchanged from my initial visit note.  Past Medical History:  Diagnosis Date   Acute coronary syndrome (HCC)    Anemia    B12 deficiency    Chest pain    CHF (congestive heart failure) (HCC)    Chronic combined systolic and diastolic CHF (congestive heart failure) ([place])    a. 07/2015 Echo: EF 45-50%, mod LVH, mid apical/antsept AK, Gr 1 DD.   Chronic low back pain    CKD (chronic kidney disease), stage III (HCC)    Constipation    COPD (chronic obstructive pulmonary disease) (HCC)    Coronary artery disease    a. 2012 s/p MI/PCI: s/p DES to mid/dist RCA and overlapping DES to LAD;  b. 07/2015 Cath: LAD 10% ISR, D1 40(jailed), LCX 69m OM2 30, OM3 30, RCA 562mSR, 10d ISR; c. 07/2016 NSTEMI/PCI: LAD 85ost/p/m (3.5x20 Synergy DES overlapping prior stent), D1 40, LCX 7565mM2 95 (2.75x16 Synergy DES), OM3 30, RCA 12m62md.   Depression    Diabetic gastroparesis (HCC)[place] Diabetic polyneuropathy (HCC)[place] DKA (diabetic ketoacidoses) 03/14/2018   GERD (gastroesophageal reflux disease)    Gout    Heart failure (HCC)    Heart murmur    History of MI (myocardial infarction)    Hyperlipemia    Hypertension    Hypertensive heart disease    Hypokalemia    Ischemic cardiomyopathy    a. 07/2015 Echo: EF 45-50%, mod LVH, mid-apicalanteroseptal AK, Gr1 DD.   Joint pain    Kidney problem    Morbid obesity (HCC)[place] OSA on CPAP    Osteoarthritis    Osteomyelitis (HCC)[place] a. 07/2016 MTP joint of R great toe s/p transmetatarsal amputation.    Other fatigue    Polyneuropathy in diabetes(357.2)    Pulmonary sarcoidosis (HCC)[place] "problems w/it years ago; no problems anymore" (07/03/2014)   Rectal bleeding    Shortness of breath    Shortness of breath on exertion    Sleep apnea    Stomach ulcer    Swallowing difficulty    Type II  diabetes mellitus [facility])    Past Surgical History:  Procedure Laterality Date   AMPUTATION Right 07/24/2016   Procedure: TRANSMETATARSAL FOOT AMPUTATION;  Surgeon: [name], MD;  Location: [place];  Service: Orthopedics;  Laterality: Right;   BALLOON DILATION N/A 03/21/2018   Procedure: BALLOON DILATION;  Surgeon: [name], MD;  Location: [facility];  Service: Endoscopy;  Laterality: N/A;   BREAST LUMPECTOMY Right ~ 2000   "benign"   CARDIAC CATHETERIZATION N/A 07/30/2015   Procedure: Left Heart Cath and Coronary Angiography;  Surgeon: Burnell Blanks, MD;  Location: [place] CV LAB;  Service: Cardiovascular;  Laterality: N/A;   CARDIAC CATHETERIZATION N/A 07/19/2016   Procedure: Left Heart Cath and Coronary Angiography;  Surgeon: Belva Crome, MD;  Location: [place] CV LAB;  Service: Cardiovascular;  Laterality: N/A;   CARDIAC CATHETERIZATION N/A 07/29/2016   Procedure: Coronary Stent Intervention;  Surgeon: Belva Crome, MD;  Location: [place] CV LAB;  Service: Cardiovascular;  Laterality: N/A;   CARDIAC CATHETERIZATION N/A 07/29/2016   Procedure: Intravascular Ultrasound/IVUS;  Surgeon: Belva Crome, MD;  Location: [place] CV LAB;  Service: Cardiovascular;  Laterality: N/A;   CATARACT EXTRACTION W/ INTRAOCULAR LENS  IMPLANT, BILATERAL Bilateral 2014-2015   COLONOSCOPY WITH PROPOFOL N/A 08/22/2017   Procedure: COLONOSCOPY WITH PROPOFOL;  Surgeon: [name], MD;  Location: [place];  Service: Endoscopy;  Laterality: N/A;   CORONARY ANGIOPLASTY WITH STENT PLACEMENT  10/31/2011   30 % MID ATRIOVENTRICULAR GROOVE CX STENOSIS. 90 % MID RCA.PTA TO LAD/STENTING OF  TANDEM 60-70%. LAD STENOSIS 99% REDUCED TO 0%.3.0 X 32MM PROMUS DES POSTDILATED TO 3.25MM.   CORONARY ANGIOPLASTY WITH STENT PLACEMENT  07/03/2014   "2"   CORONARY STENT INTERVENTION N/A 04/09/2019   Procedure: CORONARY STENT INTERVENTION;  Surgeon: Jettie Booze, MD;  Location: [place] CV LAB;  Service: Cardiovascular;  Laterality: N/A;   CORONARY STENT INTERVENTION N/A 08/22/2019   Procedure: CORONARY STENT INTERVENTION;  Surgeon: Martinique, Peter M, MD;  Location: [place] CV LAB;  Service: Cardiovascular;  Laterality: N/A;   ESOPHAGOGASTRODUODENOSCOPY (EGD) WITH PROPOFOL N/A 03/21/2018   Procedure: ESOPHAGOGASTRODUODENOSCOPY (EGD) WITH PROPOFOL;  Surgeon: [name], MD;  Location: [place];  Service: Endoscopy;  Laterality: N/A;   I & D EXTREMITY Right 10/30/2013   Procedure: IRRIGATION AND DEBRIDEMENT RIGHT SMALL FINGER POSSIBLE SECONDARY WOUND CLOSURE;  Surgeon: [name], MD;  Location: [facility];  Service: Orthopedics;  Laterality: Right;  [name] is available after 4pm   I & D EXTREMITY Right 11/01/2013   Procedure:  IRRIGATION AND DEBRIDEMENT RIGHT  PROXIMAL PHALANGEAL LEVEL AMPUTATION;  Surgeon: [name], MD;  Location: [facility];  Service: Orthopedics;  Laterality: Right;   INCISION AND DRAINAGE Right 10/28/2013   Procedure: INCISION AND DRAINAGE Right small finger;  Surgeon: [name], MD;  Location: [facility];  Service: Orthopedics;  Laterality: Right;   LEFT HEART CATH Left 11/03/2011   Procedure: LEFT HEART CATH;  Surgeon: [name], MD;  Location: [facility] LAB;  Service: Cardiovascular;  Laterality: Left;   LEFT HEART CATH AND CORONARY ANGIOGRAPHY N/A 04/09/2019   Procedure: LEFT HEART CATH AND CORONARY ANGIOGRAPHY;  Surgeon: Jettie Booze, MD;  Location: [place] CV LAB;  Service: Cardiovascular;  Laterality: N/A;   LEFT HEART CATHETERIZATION WITH CORONARY ANGIOGRAM N/A 07/03/2014   Procedure: LEFT HEART CATHETERIZATION WITH  CORONARY ANGIOGRAM;  Surgeon: [name], MD;  Location: [facility] CATH LAB;  Service: Cardiovascular;  Laterality: N/A;   PERCUTANEOUS CORONARY STENT INTERVENTION (PCI-S) Left 11/03/2011   Procedure: PERCUTANEOUS CORONARY STENT INTERVENTION (PCI-S);  Surgeon: [name], MD;  Location: [place] CATH LAB;  Service: Cardiovascular;  Laterality: Left;   RIGHT/LEFT HEART CATH AND CORONARY ANGIOGRAPHY N/A 08/22/2019   Procedure: RIGHT/LEFT HEART CATH AND CORONARY ANGIOGRAPHY;  Surgeon: Martinique, Peter M, MD;  Location: [place] CV LAB;  Service: Cardiovascular;  Laterality: N/A;   TOE AMPUTATION Right ~ 2010   "2nd toe"   TOE AMPUTATION Right 2012   "3rd toe"   Social History   Socioeconomic History   Marital status: Married    Spouse name: Dorian Pod   Number of children: 3   Years of education: Not on file   Highest education level: Not on file  Occupational History    Comment: disabled  Tobacco Use   Smoking status: Never   Smokeless tobacco: Never  Vaping Use   Vaping Use: Never used  Substance and Sexual Activity   Alcohol use: Yes    Comment: social    Drug use: No   Sexual activity: Yes    Partners: Female  Other Topics Concern   Not on file  Social History Narrative   Not on file   Social Determinants of Health   Financial Resource Strain: Not on file  Food Insecurity: Not on file  Transportation Needs: Not on file  Physical Activity: Not on file  Stress: Not on file  Social Connections: Not on file  Intimate Partner Violence: Not on file   Current Outpatient Medications on File Prior to Visit  Medication Sig Dispense Refill   Accu-Chek FastClix Lancets MISC 1 each by Other route in the morning, at noon, in the evening, and at bedtime.      ACCU-CHEK GUIDE test strip USE UP TO FOUR TIMES DAILY AS DIRECTED (Patient taking differently: 1 each by Other route See admin instructions. USE UP TO FOUR TIMES DAILY AS DIRECTED) 300 each 3   acetaminophen (TYLENOL) 650 MG CR tablet  Take 1,300 mg by mouth daily as needed for pain.     allopurinol (ZYLOPRIM) 300 MG tablet Take 300 mg by mouth daily.      aspirin EC 81 MG tablet Take 81 mg by mouth daily.      blood glucose meter kit and supplies Dispense based on patient and insurance preference. Use up to four times daily as directed. (FOR ICD-10 E10.9, E11.9). (Patient taking differently: 1 each by Other route See admin instructions. Dispense based on patient and insurance preference. Use up to four times daily as directed. (FOR ICD-10 E10.9, E11.9).) 1 each 0   BRILINTA 90 MG TABS tablet Take 1 tablet by mouth twice daily 180 tablet 0   Continuous Blood Gluc Receiver (FREESTYLE LIBRE 2 READER) DEVI 1 each by Does not apply route daily. (Patient not taking: Reported on 12/10/2021) 1 each 0   Continuous Blood Gluc Sensor (FREESTYLE LIBRE 2 SENSOR) MISC 1 each by Does not apply route every 14 (fourteen) days. (Patient not taking: Reported on 12/10/2021) 6 each 3   famotidine (PEPCID) 20 MG tablet Take 20 mg by mouth at bedtime.     furosemide (LASIX) 80 MG tablet Take 160 mg twice daily for 1 week; then go back to 80 mg twice daily. 180 tablet 1   gabapentin (NEURONTIN) 100 MG capsule Take 1 capsule (100 mg total) by mouth at bedtime. 30 capsule 0   insulin glargine (LANTUS SOLOSTAR) 100 UNIT/ML Solostar  Pen Inject 30 Units into the skin 2 (two) times daily. 45 mL 3   insulin lispro (HUMALOG KWIKPEN) 200 UNIT/ML KwikPen Inject 10-18 Units into the skin 3 (three) times daily before meals. 45 mL 3   isosorbide mononitrate (IMDUR) 60 MG 24 hr tablet Take 3 tablets (180 mg total) by mouth daily. 90 tablet 3   Lidocaine HCl-Benzyl Alcohol (SALONPAS LIDOCAINE PLUS) 4-10 % CREA Apply 1 patch topically as needed.     metoprolol succinate (TOPROL-XL) 25 MG 24 hr tablet Take 25 mg by mouth daily. Take 3 tablets ($RemoveBe'75mg'nrxKpPJWY$  total) by mouth in the morning and 2 tablets ($RemoveBe'50mg'TuXSGjhbI$  total) in the evening.     nitroGLYCERIN (NITROSTAT) 0.4 MG SL tablet  Place 1 tablet (0.4 mg total) under the tongue every 5 (five) minutes as needed for chest pain. 25 tablet 1   pantoprazole (PROTONIX) 40 MG tablet TAKE 1 TABLET BY MOUTH TWICE DAILY BEFORE MEAL(S) 180 tablet 0   rosuvastatin (CRESTOR) 20 MG tablet Take 20 mg by mouth daily.     Semaglutide, 1 MG/DOSE, (OZEMPIC, 1 MG/DOSE,) 4 MG/3ML SOPN Inject 1 mg into the skin once a week. 9 mL 3   tretinoin (RETIN-A) 0.1 % cream Apply topically daily.     Vitamin D, Ergocalciferol, (DRISDOL) 1.25 MG (50000 UNIT) CAPS capsule Take 1 capsule (50,000 Units total) by mouth every 7 (seven) days. 4 capsule 0   No current facility-administered medications on file prior to visit.   Allergies  Allergen Reactions   Chlorthalidone Other (See Comments)    Causes dehydration, kidneys shut down   Family History  Problem Relation Age of Onset   Sudden death Mother    Diabetes Maternal Grandmother    Hypertension Maternal Grandmother    Heart attack Maternal Aunt    Sudden death Other     PE: BP 128/82 (BP Location: Right Arm, Patient Position: Sitting, Cuff Size: Normal)    Pulse 86    Ht $R'5\' 9"'fj$  (1.753 m)    Wt (!) 319 lb 6.4 oz (144.9 kg)    SpO2 98%    BMI 47.17 kg/m  Wt Readings from Last 3 Encounters:  01/21/22 (!) 319 lb 6.4 oz (144.9 kg)  12/10/21 295 lb 9.6 oz (134.1 kg)  11/03/21 (!) 323 lb 12.8 oz (146.9 kg)   Constitutional: overweight, in NAD Eyes: PERRLA, EOMI, no exophthalmos ENT: moist mucous membranes, no thyromegaly, no cervical lymphadenopathy Cardiovascular: RRR, No MRG, + LE edema Respiratory: CTA B Musculoskeletal: + Deformities: Right transmetatarsal amputation, strength intact in all 4 Skin: moist, warm, no rashes Neurological: no tremor with outstretched hands, DTR normal in all 4 Diabetic Foot Exam - Simple   Simple Foot Form Diabetic Foot exam was performed with the following findings: Yes 01/21/2022 10:07 AM  Visual Inspection See comments: Yes Sensation Testing See comments:  Yes Pulse Check See comments: Yes Comments Warm skin, no breakage. No pulses felt. Transmetatarsal amputation R foot. Onychodystrophy. Decrease sensation to monofilament bilaterally.      ASSESSMENT: 1. DM2, insulin-dependent, uncontrolled, with complications - CAD - s/p AMI 2012, s/p multiple PCI's; ischemic cardiomyopathy; CHF. Dr. Claiborne Billings - CKD stage 4 - DR - PN - s/p R transmetatarsal amputation - Gastroparesis - History of DKA  PLAN:  1. Patient with history of uncontrolled type 2 diabetes, on basal/bolus insulin regimen and weekly GLP-1 receptor agonist, with worsening control at our visit from 10/2021,, when HbA1c was 11.2%, increased from 9.1%.  At that time, he was not  checking sugars.  He was able to change from Victoza to Jaconita, which he continues today.  At last visit, he was still not checking blood sugars and did not obtain the CGM as advised.  I advised him that if he was not checking blood sugars and we have no data to analyze patterns of his diabetes control, unfortunately, I would not be able to help him.  We did not change his regimen at that time.  I did advise him again to try to obtain the CGM from [employer], from whom he was obtaining CPAP supplies.  He was not able to obtain a freestyle libre CGM through [employer] >> would want me to call it in to the pharmacy. -At this visit, per review of his meter download, his blood sugars improved.  He still has occasional hyperglycemic spikes in the 200s especially after meals.  He mentions that he is not taking his Humalog more consistently, previously was missing many doses.  Also, 3 weeks ago he was shown how to take Ozempic correctly and he mentions that he was not taking the full dose before.  Sugars improved after he started to take it correctly. -At today's visit we gave him a sample of the freestyle libre 2 CGM and discussed how it works.  I also advised him to work with another supplier to obtain it especially as his  insurance approved it. - I suggested to:  Patient Instructions  Please continue: - Basaglar 30 units a day 2x a day - Humalog 15-20 units 15 minutes before meals - Ozempic 1 mg weekly  The most common suppliers for the continuous glucose monitor are:  [place] [employer]: [place]: [phone] Ext [phone] [employer]: [place]: [place]: [phone] [name]: [phone]  Please come back for a follow-up appointment in 3 months.  - we checked his HbA1c: 12.9% (EVEN HIGHER) - advised to check sugars at different times of the day - 4x a day, rotating check times - advised for yearly eye exams >> he is UTD - foot exam performed today - return to clinic in 3 months  2. HL -Reviewed latest lipid panel from 07/2021: LDL very high, as he was off Crestor at that time: Lab Results  Component Value Date   CHOL 283 (H) 08/11/2021   HDL 89 08/11/2021   LDLCALC 169 (H) 08/11/2021   TRIG 145 08/11/2021   CHOLHDL 2.0 08/17/2019  -He continues Crestor 40 mg daily, restarted in 07/2021.  No side effects.  3.  Obesity class III -He has large fluctuations in his weight, possibly contributed to by fluid status -I referred him to nutrition in the past that he was working with Antonieta Iba on improving his diet.  Barriers to improve nutrition: Increased beer intake, eating takeout frequently, sleeping only 5 hours a night -He is now on Ozempic which should also help with weight loss -He was previously going to the weight management clinic.  He is not seeing them anymore... -Before last visit he lost 28 pounds, but unclear how much was related to uncontrolled diabetes. - gained 24 lbs since last OV!-Most likely fluid retention  Philemon Kingdom, MD PhD [facility] Endocrinology

## 2022-02-03 DIAGNOSIS — H40053 Ocular hypertension, bilateral: Secondary | ICD-10-CM | POA: Diagnosis not present

## 2022-02-06 DIAGNOSIS — E1165 Type 2 diabetes mellitus with hyperglycemia: Secondary | ICD-10-CM | POA: Diagnosis not present

## 2022-02-14 ENCOUNTER — Other Ambulatory Visit (HOSPITAL_BASED_OUTPATIENT_CLINIC_OR_DEPARTMENT_OTHER): Payer: Self-pay | Admitting: General Practice

## 2022-02-17 NOTE — Progress Notes (Signed)
Cardiology Office Note:    Date:  02/22/2022   ID:  Joshua [name], DOB 30-Dec-1955, MRN [id number]  PCP:  [name], MD   [facility] Providers Cardiologist:  [name], MD     Referring MD: [name], MD   Follow-up for CAD and chronic combined systolic and diastolic CHF.  Also in clinic today for preoperative cardiac evaluation  History of Present Illness:    Joshua [name] is a 66 y.o. male with a hx of coronary artery disease status post PCI with DES to his LAD and RCA in 2012.  He underwent subsequent PCI with DES to LAD and circumflex in 2017 and PCI to LAD for ISR 4/20, PCI with DES to, PCI with DES to P LAD 9/20, combined chronic CHF, HTN, HLD, type 2 diabetes, status post right metatarsal amputation in 2017, osteomyelitis, GERD, CKD stage IV, obstructive sleep apnea, and obesity.   He was seen and evaluated by Dr. Claiborne Billings 3/21.  At that time he was doing well from a cardiac standpoint.  He denied shortness of breath and chest discomfort.  His echocardiogram 4/20 showed an LVEF of 55-60% with intermediate diastolic dysfunction, suboptimal acoustic windows limited evaluation for wall motion abnormalities, and no significant valvular abnormalities.   He was seen by Roby Lofts, PA-C on 08/26/2020.  During that time he continued to do well from a cardiac standpoint.  He continued to have DOE however, it had improved since his previous cardiology visit.  He denied chest pain.  He reported that he had been out of Imdur for couple months with no issues and had restarted 120 mg daily.  He had lost around 30 pounds with dietary changes.  Who was congratulated on his weight loss.  He did note some dizziness/lightheadedness with waking up in the morning.  He also noted that he had been struggling somewhat with his CPAP mask.  It had been leaking and he was therefore not as compliant with his CPAP over the prior 2 months.   He was seen by Dr. Claiborne Billings 02/23/2021.  During that time  he continued to deny chest discomfort.  He was meeting his compliance standards for his CPAP use and averaging 6 hours and 8 minutes of use per night.  He had received a new CPAP mask which had helped with fitting his face and leaks.   He presented to the clinic 08/11/2021 for follow-up evaluation stated over the past several days he had noticed left chest discomfort.  He had also noticed increased work of breathing with increased physical activity.  He reported that he had taken nitroglycerin 3 times over the past 3 weeks.  He reported that his nitroglycerin was old and that it took about 30 minutes for the medication to work.  His chest discomfort on all occasions came on at rest.  He did not notice chest discomfort with increased exertion.  He did not take his medications on the day of the visit.  His blood pressure was 152/76.  He reported that when he took his amlodipine Imdur, and metoprolol his blood pressure ran in the 127N systolic.  Nephrology had been dosing his furosemide.  We reviewed the importance of low-sodium diet and fluid restriction.  Due to his CHF I will ordered an echocardiogram, give him a fluid restriction of no more than 64 ounces daily,  recommended that he chew sugar-free gum or sugar-free candy, I increased his Imdur to 180 mg daily, asked him to weigh himself  daily, and follow-up after his echocardiogram.  His chest discomfort was reproducible with deep palpation on exam.  His echocardiogram 08/19/2021 showed normal LVEF and G2 DD with no significant changes from previous exam/study.  He presented to the clinic 09/16/2021 for follow-up evaluation and stated he continued to drink excessive amounts of fluid.  He reported cravings for things such as tea and very cold drinks.  Since he was 66 years old he has had what he describes as increased amount of thirst.  [name] prescribed increased furosemide x1 week after he was seen in cardiology.  He did lose a significant amount of weight.  He  reported that he did urinate increased amount with increased diuretic medication.  He had not been weighing daily.  He reported some dietary indiscretion.  We discussed the importance of low-sodium diet and weight log.  I  instructed him to contact the office with a weight increase of 3 pounds overnight or 5 pounds in 1 week.  We  increased his furosemide to 160 mg twice daily x1 week and planned repeat  BMP in 1 week.  I gave him the salty 6 diet sheet, a weight log, and the  [place] support stocking sheet.  We planned follow-up in 1 month.  He presented to the clinic 10/26/21 for follow-up evaluation and stated he noticed some cramping after his last round of increased diuresis.  He reported that he did use some mustard which helped with his cramping.  His weight decreased down to 316 pounds and at visit he weighed 323 pounds.  He did have some mild generalized lower extremity swelling .  It appears his weight increase was related to actual weight gain.  He did note some lack of sensation in his bilateral lower extremities.  He reported that he had neuropathy and took gabapentin.  His blood pressure initially was 158/80 and on recheck it was 128/78.  He had not been taking his amlodipine for over a month.  We removed it from his medication list.  I asked him to maintain a blood pressure log, continue his fluid restriction, increase his physical activity as tolerated, continue heart healthy low-sodium diet, continue to work with [place] weight loss, ordered a BMP , and plan follow-up for 3 to 4 months with Dr. Claiborne Billings.  He presents to the clinic today for preoperative cardiac evaluation and follow-up evaluation.  He states he feels well today.  His biggest complaint is with regards to his stomach.  He has noticed increased constipation over the last several weeks.  He has been to GI who have asked him to increase his fiber and do MiraLAX daily.  He reports that he is somewhat sedentary but is working on  becoming more physically active.  2 months ago he noticed an episode of chest discomfort while he was at rest in the evening.  He did take nitroglycerin.  The chest discomfort went away after about 1 hour.  He denies chest pain with increased physical activity.  His chest discomfort seems somewhat vague and atypical.  His EKG today shows sinus rhythm with first-degree AV block possible left atrial enlargement 91 bpm.  He has not had any other episodes of chest discomfort.  We reviewed his upcoming colonoscopy.  I will continue his current medication regimen, have him increase his physical activity as tolerated and plan follow-up in 6 months.   Today he denies chest pain, increased shortness of breath,  fatigue, palpitations, melena, hematuria, hemoptysis, diaphoresis, weakness, presyncope,  syncope, orthopnea, and PND.  Past Medical History:  Diagnosis Date   Acute coronary syndrome (HCC)    Anemia    B12 deficiency    Chest pain    CHF (congestive heart failure) (HCC)    Chronic combined systolic and diastolic CHF (congestive heart failure) ([place])    a. 07/2015 Echo: EF 45-50%, mod LVH, mid apical/antsept AK, Gr 1 DD.   Chronic low back pain    CKD (chronic kidney disease), stage III (HCC)    Constipation    COPD (chronic obstructive pulmonary disease) (HCC)    Coronary artery disease    a. 2012 s/p MI/PCI: s/p DES to mid/dist RCA and overlapping DES to LAD;  b. 07/2015 Cath: LAD 10% ISR, D1 40(jailed), LCX 10m OM2 30, OM3 30, RCA 558mSR, 10d ISR; c. 07/2016 NSTEMI/PCI: LAD 85ost/p/m (3.5x20 Synergy DES overlapping prior stent), D1 40, LCX 752mM2 95 (2.75x16 Synergy DES), OM3 30, RCA 28m55md.   Depression    Diabetic gastroparesis (HCC)[place] Diabetic polyneuropathy (HCC)[place] DKA (diabetic ketoacidoses) 03/14/2018   GERD (gastroesophageal reflux disease)    Gout    Heart failure (HCC)    Heart murmur    History of MI (myocardial infarction)    Hyperlipemia    Hypertension    Hypertensive  heart disease    Hypokalemia    Ischemic cardiomyopathy    a. 07/2015 Echo: EF 45-50%, mod LVH, mid-apicalanteroseptal AK, Gr1 DD.   Joint pain    Kidney problem    Morbid obesity (HCC)[place] OSA on CPAP    Osteoarthritis    Osteomyelitis (HCC)[place] a. 07/2016 MTP joint of R great toe s/p transmetatarsal amputation.   Other fatigue    Polyneuropathy in diabetes(357.2)    Pulmonary sarcoidosis (HCC)[place] "problems w/it years ago; no problems anymore" (07/03/2014)   Rectal bleeding    Shortness of breath    Shortness of breath on exertion    Sleep apnea    Stomach ulcer    Swallowing difficulty    Type II diabetes mellitus (HCC)[place]  Past Surgical History:  Procedure Laterality Date   AMPUTATION Right 07/24/2016   Procedure: TRANSMETATARSAL FOOT AMPUTATION;  Surgeon: MarcNewt Minion;  [facility];  Laterality: Right;   BALLOON DILATION N/A 03/21/2018   Procedure: BALLOON DILATION;  Surgeon: BuccRonald Lobo;  [facility];  Service: Endoscopy;  Laterality: N/A;   BREAST LUMPECTOMY Right ~ 2000   "benign"   CARDIAC CATHETERIZATION N/A 07/30/2015   Procedure: Left Heart Cath and Coronary Angiography;  Surgeon: ChriBurnell Blanks;  [facility];  Service: Cardiovascular;  Laterality: N/A;   CARDIAC CATHETERIZATION N/A 07/19/2016   Procedure: Left Heart Cath and Coronary Angiography;  Surgeon: HenrBelva Crome;  [facility];  Service: Cardiovascular;  Laterality: N/A;   CARDIAC CATHETERIZATION N/A 07/29/2016   Procedure: Coronary Stent Intervention;  Surgeon: HenrBelva Crome;  [facility];  Service: Cardiovascular;  Laterality: N/A;   CARDIAC CATHETERIZATION N/A 07/29/2016   Procedure: Intravascular Ultrasound/IVUS;  Surgeon: HenrBelva Crome;  [facility];  Service: Cardiovascular;  Laterality: N/A;   CATARACT EXTRACTION W/ INTRAOCULAR LENS  IMPLANT, BILATERAL Bilateral 2014-2015    COLONOSCOPY WITH PROPOFOL N/A 08/22/2017   Procedure: COLONOSCOPY WITH PROPOFOL;  Surgeon: SchoWilford Corner;  [facility]  Service: Endoscopy;  Laterality: N/A;   CORONARY ANGIOPLASTY WITH STENT PLACEMENT  10/31/2011   30 % MID ATRIOVENTRICULAR GROOVE CX STENOSIS. 90 % MID RCA.PTA TO LAD/STENTING OF TANDEM 60-70%. LAD STENOSIS 99% REDUCED TO 0%.3.0 X 32MM PROMUS DES POSTDILATED TO 3.25MM.   CORONARY ANGIOPLASTY WITH STENT PLACEMENT  07/03/2014   "2"   CORONARY STENT INTERVENTION N/A 04/09/2019   Procedure: CORONARY STENT INTERVENTION;  Surgeon: Jettie Booze, MD;  Location: [place] CV LAB;  Service: Cardiovascular;  Laterality: N/A;   CORONARY STENT INTERVENTION N/A 08/22/2019   Procedure: CORONARY STENT INTERVENTION;  Surgeon: Martinique, Peter M, MD;  Location: [place] CV LAB;  Service: Cardiovascular;  Laterality: N/A;   ESOPHAGOGASTRODUODENOSCOPY (EGD) WITH PROPOFOL N/A 03/21/2018   Procedure: ESOPHAGOGASTRODUODENOSCOPY (EGD) WITH PROPOFOL;  Surgeon: [name], MD;  Location: [place];  Service: Endoscopy;  Laterality: N/A;   I & D EXTREMITY Right 10/30/2013   Procedure: IRRIGATION AND DEBRIDEMENT RIGHT SMALL FINGER POSSIBLE SECONDARY WOUND CLOSURE;  Surgeon: [name], MD;  Location: [facility];  Service: Orthopedics;  Laterality: Right;  [name] is available after 4pm   I & D EXTREMITY Right 11/01/2013   Procedure:  IRRIGATION AND DEBRIDEMENT RIGHT  PROXIMAL PHALANGEAL LEVEL AMPUTATION;  Surgeon: [name], MD;  Location: [facility];  Service: Orthopedics;  Laterality: Right;   INCISION AND DRAINAGE Right 10/28/2013   Procedure: INCISION AND DRAINAGE Right small finger;  Surgeon: [name], MD;  Location: [facility];  Service: Orthopedics;  Laterality: Right;   LEFT HEART CATH Left 11/03/2011   Procedure: LEFT HEART CATH;  Surgeon: [name], MD;  [facility] LAB;  Service: Cardiovascular;  Laterality: Left;   LEFT HEART CATH AND  CORONARY ANGIOGRAPHY N/A 04/09/2019   Procedure: LEFT HEART CATH AND CORONARY ANGIOGRAPHY;  Surgeon: Jettie Booze, MD;  Location: [place] CV LAB;  Service: Cardiovascular;  Laterality: N/A;   LEFT HEART CATHETERIZATION WITH CORONARY ANGIOGRAM N/A 07/03/2014   Procedure: LEFT HEART CATHETERIZATION WITH CORONARY ANGIOGRAM;  Surgeon: [name], MD;  Location: [facility] C[facility] LAB;  Service: Cardiovascular;  Laterality: N/A;   PERCUTANEOUS CORONARY STENT INTERVENTION (PCI-S) Left 11/03/2011   Procedure: PERCUTANEOUS CORONARY STENT INTERVENTION (PCI-S);  Surgeon: [name], MD;  Location: [facility] CATH LAB;  Service: Cardiovascular;  Laterality: Left;   RIGHT/LEFT HEART CATH AND CORONARY ANGIOGRAPHY N/A 08/22/2019   Procedure: RIGHT/LEFT HEART CATH AND CORONARY ANGIOGRAPHY;  Surgeon: Martinique, Peter M, MD;  Location: [place] CV LAB;  Service: Cardiovascular;  Laterality: N/A;   TOE AMPUTATION Right ~ 2010   "2nd toe"   TOE AMPUTATION Right 2012   "3rd toe"    Current Medications: Current Meds  Medication Sig   Accu-Chek FastClix Lancets MISC 1 each by Other route in the morning, at noon, in the evening, and at bedtime.    ACCU-CHEK GUIDE test strip USE UP TO FOUR TIMES DAILY AS DIRECTED (Patient taking differently: 1 each by Other route See admin instructions. USE UP TO FOUR TIMES DAILY AS DIRECTED)   acetaminophen (TYLENOL) 650 MG CR tablet Take 1,300 mg by mouth daily as needed for pain.   allopurinol (ZYLOPRIM) 300 MG tablet Take 300 mg by mouth daily.    aspirin EC 81 MG tablet Take 81 mg by mouth daily.    blood glucose meter kit and supplies Dispense based on patient and insurance preference. Use up to four times daily as directed. (FOR ICD-10 E10.9, E11.9). (Patient taking differently:  1 each by Other route See admin instructions. Dispense based on patient and insurance preference. Use up to four times daily as directed. (FOR ICD-10 E10.9, E11.9).)   BRILINTA 90 MG TABS tablet Take 1  tablet by mouth twice daily   dapagliflozin propanediol (FARXIGA) 10 MG TABS tablet Take 10 mg by mouth daily.   famotidine (PEPCID) 20 MG tablet Take 20 mg by mouth at bedtime.   furosemide (LASIX) 80 MG tablet Take 160 mg twice daily for 1 week; then go back to 80 mg twice daily.   insulin glargine (LANTUS SOLOSTAR) 100 UNIT/ML Solostar Pen Inject 30 Units into the skin 2 (two) times daily.   insulin lispro (HUMALOG KWIKPEN) 200 UNIT/ML KwikPen Inject 10-18 Units into the skin 3 (three) times daily before meals.   isosorbide mononitrate (IMDUR) 60 MG 24 hr tablet Take 3 tablets by mouth once daily   Lidocaine HCl-Benzyl Alcohol (SALONPAS LIDOCAINE PLUS) 4-10 % CREA Apply 1 patch topically as needed.   metoprolol succinate (TOPROL-XL) 25 MG 24 hr tablet Take 25 mg by mouth daily. Take 3 tablets (10m total) by mouth in the morning and 2 tablets (54mtotal) in the evening.   nitroGLYCERIN (NITROSTAT) 0.4 MG SL tablet Place 1 tablet (0.4 mg total) under the tongue every 5 (five) minutes as needed for chest pain.   pantoprazole (PROTONIX) 40 MG tablet TAKE 1 TABLET BY MOUTH TWICE DAILY BEFORE MEAL(S)   rosuvastatin (CRESTOR) 20 MG tablet Take 20 mg by mouth daily.   Semaglutide, 1 MG/DOSE, (OZEMPIC, 1 MG/DOSE,) 4 MG/3ML SOPN Inject 1 mg into the skin once a week.   tretinoin (RETIN-A) 0.1 % cream Apply topically daily.     Allergies:   Chlorthalidone   Social History   Socioeconomic History   Marital status: Married    Spouse name: ElDorian Pod Number of children: 3   Years of education: Not on file   Highest education level: Not on file  Occupational History    Comment: disabled  Tobacco Use   Smoking status: Never   Smokeless tobacco: Never  Vaping Use   Vaping Use: Never used  Substance and Sexual Activity   Alcohol use: Yes    Comment: social    Drug use: No   Sexual activity: Yes    Partners: Female  Other Topics Concern   Not on file  Social History Narrative   Not on file    Social Determinants of Health   Financial Resource Strain: Not on file  Food Insecurity: Not on file  Transportation Needs: Not on file  Physical Activity: Not on file  Stress: Not on file  Social Connections: Not on file     Family History: The patient's family history includes Diabetes in his maternal grandmother; Heart attack in his maternal aunt; Hypertension in his maternal grandmother; Sudden death in his mother and another family member.  ROS:   Please see the history of present illness.     All other systems reviewed and are negative.   Risk Assessment/Calculations:           Physical Exam:    VS:  BP 132/84 (BP Location: Left Arm, Patient Position: Sitting, Cuff Size: Large)    Pulse 84     Wt Readings from Last 3 Encounters:  01/21/22 (!) 319 lb 6.4 oz (144.9 kg)  12/10/21 295 lb 9.6 oz (134.1 kg)  11/03/21 (!) 323 lb 12.8 oz (146.9 kg)     GEN:  Well nourished,  well developed in no acute distress HEENT: Normal NECK: No JVD; No carotid bruits LYMPHATICS: No lymphadenopathy CARDIAC: RRR, no murmurs, rubs, gallops RESPIRATORY:  Clear to auscultation without rales, wheezing or rhonchi  ABDOMEN: Soft, non-tender, non-distended MUSCULOSKELETAL:  No edema; No deformity  SKIN: Warm and dry NEUROLOGIC:  Alert and oriented x 3 PSYCHIATRIC:  Normal affect    EKGs/Labs/Other Studies Reviewed:    The following studies were reviewed today:  Echocardiogram 04/07/2019   IMPRESSIONS     1. The left ventricle has normal systolic function, with an ejection  fraction of 55-60%. The cavity size was normal. Indeterminate diastolic  filling due to E-A fusion.   2. LV regional wall motion abnormalities could not be completely assessed  due to suboptimal acoustic windows. Grossly normal wall motion.   3. The inferior vena cava was dilated in size with <50% respiratory  variability.   FINDINGS   Left Ventricle: The left ventricle has normal systolic function, with  an  ejection fraction of 55-60%. The cavity size was normal. There is no  increase in left ventricular wall thickness. Indeterminate diastolic  filling due to E-A fusion.      Right Ventricle: The right ventricle has normal systolic function. The  cavity was normal. There is right vetricular wall thickness was not  assessed.   Left Atrium: Left atrial size was not well visualized.   Right Atrium: Right atrial size was not well visualized. Right atrial  pressure is estimated at 15 mmHg.   Interatrial Septum: The interatrial septum was not well visualized.   Pericardium: There is no evidence of pericardial effusion.   Mitral Valve: The mitral valve is normal in structure. Mitral valve  regurgitation is not visualized by color flow Doppler.   Tricuspid Valve: Tricuspid valve regurgitation was not visualized by color  flow Doppler.   Aortic Valve: The aortic valve is normal in structure. Aortic valve  regurgitation was not visualized by color flow Doppler.   Pulmonic Valve: The pulmonic valve was grossly normal. Pulmonic valve  regurgitation is trivial by color flow Doppler.   Venous: The inferior vena cava is dilated in size with less than 50%  respiratory variability.   Echocardiogram 08/19/21 IMPRESSIONS     1. Left ventricular ejection fraction, by estimation, is 55 to 60%. The  left ventricle has low normal function. Left ventricular endocardial  border not optimally defined to evaluate regional wall motion. There is  mild concentric left ventricular  hypertrophy. Left ventricular diastolic parameters are consistent with  Grade II diastolic dysfunction (pseudonormalization).   2. Right ventricular systolic function is normal. The right ventricular  size is normal. Tricuspid regurgitation signal is inadequate for assessing  PA pressure.   3. Left atrial size was mild to moderately dilated.   4. The mitral valve is normal in structure. No evidence of mitral valve   regurgitation. No evidence of mitral stenosis.   5. The aortic valve was not well visualized. Aortic valve regurgitation  is not visualized. No aortic stenosis is present.   6. The inferior vena cava is normal in size with greater than 50%  respiratory variability, suggesting right atrial pressure of 3 mmHg.   Comparison(s): No significant change from prior study. Prior images  reviewed side by side.  EKG: Sinus rhythm with first-degree AV block possible left atrial enlargement 91 bpm no ST or T wave deviation  EKG 09/11/2021 Normal sinus rhythm no ST or T wave deviation 82 bpm  Recent Labs: 08/11/2021: ALT  12; Hemoglobin 12.3; Platelets 254; TSH 2.890 10/26/2021: BUN 33; Creatinine, Ser 2.55; Potassium 3.5; Sodium 141  Recent Lipid Panel    Component Value Date/Time   CHOL 283 (H) 08/11/2021 0929   TRIG 145 08/11/2021 0929   HDL 89 08/11/2021 0929   CHOLHDL 2.0 08/17/2019 0402   VLDL 12 08/17/2019 0402   LDLCALC 169 (H) 08/11/2021 0929    ASSESSMENT & PLAN    Chronic combined systolic and diastolic CHF-euvolemic today.  Stable chronic dyspnea.  Echocardiogram 08/19/2021 showed normal LVEF and G2 DD. Continue metoprolol, hydralazine, furosemide 80 mg daily Heart healthy low-sodium diet-salty 6 reviewed Increase physical activity as tolerated Lower extremity support stockings Daily weights-contact office with a weight increase of 3 pounds overnight or 5 pounds in 1 week Maintain weight log Maintain fluid restriction less than 64 ounces daily   Coronary artery disease-no recent episodes of chest discomfort/pain.  Had episode of chest discomfort 2 months ago.  He took nitroglycerin and his pain resolved after 1 hour.  Discomfort appears to be not related to cardiac issues.  Cardiac catheterization 4/20 showed widely patent LAD stent, 50% mid circumflex lesion, 40% proximal-mid RCA stenosis, ostial LAD-proximal LAD 90% stenosis.  Underwent scoring balloon angioplasty.  Proximal LAD  90% stenosed. Continue  metoprolol, aspirin, Brilinta, Imdur Heart healthy low-sodium diet-salty 6 given Maintain physical activity as tolerated   Essential hypertension-BP today 132/84 blood pressure at home 130s over 70s .  He continues to work with the weight loss clinic.  Continue metoprolol, Imdur,  Heart healthy low-sodium diet-salty 6 given Increase physical activity as tolerated Continue blood pressure log  Hyperlipidemia-08/11/2021: Cholesterol, Total 283; HDL 89; LDL Chol Calc (NIH) 169; Triglycerides 145 Continue aspirin, rosuvastatin Heart healthy low-sodium high-fiber diet Increase physical activity as tolerated Repeat fasting lipids and LFTs   OSA-compliant with CPAP.  Waking up fairly rested. Continue CPAP use   Preoperative cardiac evaluation-[facility], Dr.Schooler procedure scheduled for 03/16/2022, colonoscopy    Primary Cardiologist: [name], MD  Chart reviewed as part of pre-operative protocol coverage. Given past medical history and time since last visit, based on ACC/AHA guidelines, MIKAL BLASDELL would be at acceptable risk for the planned procedure without further cardiovascular testing.   His Brilinta may be held for 5 days prior to his procedure.  Please resume as soon as possible after procedure.  His aspirin will need to be continued throughout his procedure.  Patient was advised that if he develops new symptoms prior to surgery to contact our office to arrange a follow-up appointment.  He verbalized understanding.      Disposition: Follow-up with Dr. Claiborne Billings  in 4-6 months       Medication Adjustments/Labs and Tests Ordered: Current medicines are reviewed at length with the patient today.  Concerns regarding medicines are outlined above.  No orders of the defined types were placed in this encounter.   No orders of the defined types were placed in this encounter.    There are no Patient Instructions on file for this visit.    Signed, Deberah Pelton, NP  02/22/2022 8:42 AM       Notice: This dictation was prepared with Dragon dictation along with smaller phrase technology. Any transcriptional errors that result from this process are unintentional and may not be corrected upon review.  I spent 14 minutes examining this patient, reviewing medications, and using patient centered shared decision making involving her cardiac care.  Prior to her visit I spent greater than 20  minutes reviewing her past medical history,  medications, and prior cardiac tests.

## 2022-02-22 ENCOUNTER — Ambulatory Visit (INDEPENDENT_AMBULATORY_CARE_PROVIDER_SITE_OTHER): Payer: Medicare Other | Admitting: General Practice

## 2022-02-22 ENCOUNTER — Encounter: Payer: Self-pay | Admitting: General Practice

## 2022-02-22 ENCOUNTER — Other Ambulatory Visit: Payer: Self-pay

## 2022-02-22 VITALS — BP 132/84 | HR 84 | Ht 69.0 in | Wt 301.6 lb

## 2022-02-22 DIAGNOSIS — G4733 Obstructive sleep apnea (adult) (pediatric): Secondary | ICD-10-CM

## 2022-02-22 DIAGNOSIS — I25119 Atherosclerotic heart disease of native coronary artery with unspecified angina pectoris: Secondary | ICD-10-CM | POA: Diagnosis not present

## 2022-02-22 DIAGNOSIS — Z01810 Encounter for preprocedural cardiovascular examination: Secondary | ICD-10-CM | POA: Diagnosis not present

## 2022-02-22 DIAGNOSIS — Z9989 Dependence on other enabling machines and devices: Secondary | ICD-10-CM

## 2022-02-22 DIAGNOSIS — I5042 Chronic combined systolic (congestive) and diastolic (congestive) heart failure: Secondary | ICD-10-CM

## 2022-02-22 DIAGNOSIS — I10 Essential (primary) hypertension: Secondary | ICD-10-CM

## 2022-02-22 DIAGNOSIS — E785 Hyperlipidemia, unspecified: Secondary | ICD-10-CM

## 2022-02-22 NOTE — Patient Instructions (Signed)
Medication Instructions:  ?The current medical regimen is effective;  continue present plan and medications as directed. Please refer to the Current Medication list given to you today.  ? ?*If you need a refill on your cardiac medications before your next appointment, please call your pharmacy* ? ?Lab Work:   Testing/Procedures:  ?NONE    NONE ? ?Special Instructions ?PLEASE INCREASE PHYSICAL ACTIVITY AS TOLERATED  ? ?Follow-Up: ?Your next appointment:  6 month(s) In Person with [name], MD   ? ?Please call our office 2 months in advance to schedule this appointment  :1 ? ?At [facility], you and your health needs are our priority.  As part of our continuing mission to provide you with exceptional heart care, we have created designated Provider Care Teams.  These Care Teams include your primary Cardiologist (physician) and Advanced Practice Providers (APPs -  Physician Assistants and Nurse Practitioners) who all work together to provide you with the care you need, when you need it. ? ? ? ?        ?

## 2022-03-01 ENCOUNTER — Other Ambulatory Visit: Payer: Self-pay | Admitting: Gastroenterology

## 2022-03-04 ENCOUNTER — Other Ambulatory Visit: Payer: Self-pay | Admitting: Internal Medicine

## 2022-03-04 DIAGNOSIS — E1159 Type 2 diabetes mellitus with other circulatory complications: Secondary | ICD-10-CM

## 2022-03-04 DIAGNOSIS — E785 Hyperlipidemia, unspecified: Secondary | ICD-10-CM

## 2022-03-06 DIAGNOSIS — E1165 Type 2 diabetes mellitus with hyperglycemia: Secondary | ICD-10-CM | POA: Diagnosis not present

## 2022-03-08 ENCOUNTER — Other Ambulatory Visit: Payer: Self-pay | Admitting: Cardiovascular Disease

## 2022-03-16 ENCOUNTER — Encounter (HOSPITAL_COMMUNITY): Payer: Self-pay | Admitting: Gastroenterology

## 2022-03-16 ENCOUNTER — Ambulatory Visit (HOSPITAL_COMMUNITY)
Admission: RE | Admit: 2022-03-16 | Discharge: 2022-03-16 | Disposition: A | Discharge status: Home / Self Care | Payer: Medicare Other | Attending: Gastroenterology | Admitting: Gastroenterology

## 2022-03-16 ENCOUNTER — Encounter (HOSPITAL_COMMUNITY)
Admission: RE | Disposition: A | Discharge status: Home / Self Care | Payer: Self-pay | Source: Home / Self Care | Attending: Gastroenterology

## 2022-03-16 ENCOUNTER — Ambulatory Visit (HOSPITAL_COMMUNITY): Payer: Medicare Other | Admitting: Certified Registered Nurse Anesthetist

## 2022-03-16 ENCOUNTER — Ambulatory Visit (HOSPITAL_BASED_OUTPATIENT_CLINIC_OR_DEPARTMENT_OTHER): Payer: Medicare Other | Admitting: Certified Registered Nurse Anesthetist

## 2022-03-16 ENCOUNTER — Other Ambulatory Visit: Payer: Self-pay

## 2022-03-16 DIAGNOSIS — Z86010 Personal history of colonic polyps: Secondary | ICD-10-CM

## 2022-03-16 DIAGNOSIS — I509 Heart failure, unspecified: Secondary | ICD-10-CM | POA: Diagnosis not present

## 2022-03-16 DIAGNOSIS — K640 First degree hemorrhoids: Secondary | ICD-10-CM | POA: Diagnosis not present

## 2022-03-16 DIAGNOSIS — I2510 Atherosclerotic heart disease of native coronary artery without angina pectoris: Secondary | ICD-10-CM | POA: Insufficient documentation

## 2022-03-16 DIAGNOSIS — K3189 Other diseases of stomach and duodenum: Secondary | ICD-10-CM

## 2022-03-16 DIAGNOSIS — I252 Old myocardial infarction: Secondary | ICD-10-CM | POA: Diagnosis not present

## 2022-03-16 DIAGNOSIS — J449 Chronic obstructive pulmonary disease, unspecified: Secondary | ICD-10-CM | POA: Diagnosis not present

## 2022-03-16 DIAGNOSIS — K269 Duodenal ulcer, unspecified as acute or chronic, without hemorrhage or perforation: Secondary | ICD-10-CM | POA: Diagnosis not present

## 2022-03-16 DIAGNOSIS — K219 Gastro-esophageal reflux disease without esophagitis: Secondary | ICD-10-CM | POA: Diagnosis not present

## 2022-03-16 DIAGNOSIS — R1310 Dysphagia, unspecified: Secondary | ICD-10-CM | POA: Diagnosis not present

## 2022-03-16 DIAGNOSIS — K2100 Gastro-esophageal reflux disease with esophagitis, without bleeding: Secondary | ICD-10-CM | POA: Insufficient documentation

## 2022-03-16 DIAGNOSIS — Z6841 Body Mass Index (BMI) 40.0 and over, adult: Secondary | ICD-10-CM | POA: Diagnosis not present

## 2022-03-16 DIAGNOSIS — K2289 Other specified disease of esophagus: Secondary | ICD-10-CM | POA: Diagnosis not present

## 2022-03-16 DIAGNOSIS — I110 Hypertensive heart disease with heart failure: Secondary | ICD-10-CM | POA: Diagnosis not present

## 2022-03-16 DIAGNOSIS — K2980 Duodenitis without bleeding: Secondary | ICD-10-CM | POA: Insufficient documentation

## 2022-03-16 DIAGNOSIS — Z955 Presence of coronary angioplasty implant and graft: Secondary | ICD-10-CM | POA: Insufficient documentation

## 2022-03-16 DIAGNOSIS — G4730 Sleep apnea, unspecified: Secondary | ICD-10-CM | POA: Diagnosis not present

## 2022-03-16 DIAGNOSIS — Z1211 Encounter for screening for malignant neoplasm of colon: Secondary | ICD-10-CM | POA: Insufficient documentation

## 2022-03-16 HISTORY — PX: COLONOSCOPY WITH PROPOFOL: SHX5780

## 2022-03-16 HISTORY — PX: BIOPSY: SHX5522

## 2022-03-16 HISTORY — PX: ESOPHAGOGASTRODUODENOSCOPY (EGD) WITH PROPOFOL: SHX5813

## 2022-03-16 LAB — GLUCOSE, CAPILLARY: Glucose-Capillary: 152 mg/dL — ABNORMAL HIGH (ref 70–99)

## 2022-03-16 SURGERY — COLONOSCOPY WITH PROPOFOL
Anesthesia: Monitor Anesthesia Care

## 2022-03-16 MED ORDER — PROPOFOL 10 MG/ML IV BOLUS
INTRAVENOUS | Status: DC | PRN
  Administered 2022-03-16 (×2): 50 mg via INTRAVENOUS
  Administered 2022-03-16 (×2): 20 mg via INTRAVENOUS
  Administered 2022-03-16: 30 mg via INTRAVENOUS

## 2022-03-16 MED ORDER — LACTATED RINGERS IV SOLN
INTRAVENOUS | Status: AC | PRN
  Administered 2022-03-16: 1000 mL via INTRAVENOUS

## 2022-03-16 MED ORDER — LIDOCAINE 2% (20 MG/ML) 5 ML SYRINGE
INTRAMUSCULAR | Status: DC | PRN
  Administered 2022-03-16: 60 mg via INTRAVENOUS

## 2022-03-16 MED ORDER — SODIUM CHLORIDE 0.9 % IV SOLN: INTRAVENOUS | Status: DC

## 2022-03-16 MED ORDER — PROPOFOL 500 MG/50ML IV EMUL
INTRAVENOUS | Status: DC | PRN
  Administered 2022-03-16: 150 ug/kg/min via INTRAVENOUS

## 2022-03-16 MED ORDER — SODIUM CHLORIDE 0.9 % IV SOLN
INTRAVENOUS | Status: DC
Start: 2022-03-16 — End: 2022-03-16

## 2022-03-16 SURGICAL SUPPLY — 25 items

## 2022-03-16 NOTE — Discharge Instructions (Addendum)
Restart Brilinta on Thursday April 6.   ? ?YOU HAD AN ENDOSCOPIC PROCEDURE TODAY: Refer to the procedure report and other information in the discharge instructions given to you for any specific questions about what was found during the examination. If this information does not answer your questions, please call [place] office at [phone] to clarify.  ? ?YOU SHOULD EXPECT: Some feelings of bloating in the abdomen. Passage of more gas than usual. Walking can help get rid of the air that was put into your GI tract during the procedure and reduce the bloating. If you had a lower endoscopy (such as a colonoscopy or flexible sigmoidoscopy) you may notice spotting of blood in your stool or on the toilet paper. Some abdominal soreness may be present for a day or two, also. ? ?DIET: Your first meal following the procedure should be a light meal and then it is ok to progress to your normal diet. A half-sandwich or bowl of soup is an example of a good first meal. Heavy or fried foods are harder to digest and may make you feel nauseous or bloated. Drink plenty of fluids but you should avoid alcoholic beverages for 24 hours. If you had a esophageal dilation, please see attached instructions for diet.   ? ?ACTIVITY: Your care partner should take you home directly after the procedure. You should plan to take it easy, moving slowly for the rest of the day. You can resume normal activity the day after the procedure however YOU SHOULD NOT DRIVE, use power tools, machinery or perform tasks that involve climbing or major physical exertion for 24 hours (because of the sedation medicines used during the test).  ? ?SYMPTOMS TO REPORT IMMEDIATELY: ?A gastroenterologist can be reached at any hour. Please call [phone]  for any of the following symptoms:  ? ?Following lower endoscopy (colonoscopy, flexible sigmoidoscopy) ?Excessive amounts of blood in the stool  ?Significant tenderness, worsening of abdominal pains  ?Swelling of the  abdomen that is new, acute  ?Fever of 100? or higher  ?Following upper endoscopy (EGD, EUS, ERCP, esophageal dilation) ?Vomiting of blood or coffee ground material  ?New, significant abdominal pain  ?New, significant chest pain or pain under the shoulder blades  ?Painful or persistently difficult swallowing  ?New shortness of breath  ?Black, tarry-looking or red, bloody stools ? ?FOLLOW UP:  ?If any biopsies were taken you will be contacted by phone or by letter within the next 1-3 weeks. Call [phone]  if you have not heard about the biopsies in 3 weeks.  ?Please also call with any specific questions about appointments or follow up tests.   ?

## 2022-03-16 NOTE — Op Note (Signed)
[facility] ?Patient Name: Joshua [name] ?Procedure Date: 03/16/2022 ?MRN: [id number] ?Attending MD: Lear Ng , MD ?Date of Birth: [birth year] ?CSN: [id number] ?Age: 66 ?Admit Type: Outpatient ?Procedure:                Colonoscopy ?Indications:              High risk colon cancer surveillance: Personal  ?                          history of colonic polyps, Last colonoscopy:  ?                          September 2018 ?Providers:                Lear Ng, MD, Jaci Carrel, RN,  ?                          Despina Pole, Technician ?Referring MD:             [name] ?Medicines:                Propofol per Anesthesia, Monitored Anesthesia Care ?Complications:            No immediate complications. ?Estimated Blood Loss:     Estimated blood loss: none. ?Procedure:                Pre-Anesthesia Assessment: ?                          - Prior to the procedure, a History and Physical  ?                          was performed, and patient medications and  ?                          allergies were reviewed. The patient's tolerance of  ?                          previous anesthesia was also reviewed. The risks  ?                          and benefits of the procedure and the sedation  ?                          options and risks were discussed with the patient.  ?                          All questions were answered, and informed consent  ?                          was obtained. Prior Anticoagulants: The patient has  ?                          taken Brilinta, last dose was 5 days prior to  ?  procedure. ASA Grade Assessment: III - A patient  ?                          with severe systemic disease. After reviewing the  ?                          risks and benefits, the patient was deemed in  ?                          satisfactory condition to undergo the procedure. ?                          After obtaining informed consent, the colonoscope  ?                           was passed under direct vision. Throughout the  ?                          procedure, the patient's blood pressure, pulse, and  ?                          oxygen saturations were monitored continuously. The  ?                          [id number]-HQ190L ([id number] Olympus colonoscope was  ?                          introduced through the anus and advanced to the the  ?                          cecum, identified by appendiceal orifice and  ?                          ileocecal valve. The colonoscopy was extremely  ?                          difficult due to poor bowel prep, poor endoscopic  ?                          visualization and a tortuous colon. ?Scope In: 12:18:17 PM ?Scope Out: 12:26:41 PM ?Scope Withdrawal Time: 0 hours 4 minutes 2 seconds  ?Total Procedure Duration: 0 hours 8 minutes 24 seconds  ?Findings: ?     The perianal and digital rectal examinations were normal. ?     Copious quantities of semi-liquid semi-solid solid stool was found in  ?     the entire colon, precluding visualization. ?     Internal hemorrhoids were found during retroflexion. The hemorrhoids  ?     were small and Grade I (internal hemorrhoids that do not prolapse). ?Impression:               - Stool in the entire examined colon. ?                          - Internal hemorrhoids. ?                          -  No specimens collected. ?Moderate Sedation: ?     N/A - MAC procedure ?Recommendation:           - Repeat colonoscopy in 6 months for surveillance  ?                          (2 day prep). ?                          - Continue present medications. ?                          - Resume Brilinta at prior dose in 2 days. ?Procedure Code(s):        --- Professional --- ?                          [phone], Colonoscopy, flexible; diagnostic, including  ?                          collection of specimen(s) by brushing or washing,  ?                          when performed (separate procedure) ?Diagnosis Code(s):        --- Professional  --- ?                          Z86.010, Personal history of colonic polyps ?                          K64.0, First degree hemorrhoids ?CPT copyright 2019 [employer]. All rights reserved. ?The codes documented in this report are preliminary and upon coder review may  ?be revised to meet current compliance requirements. ?Lear Ng, MD ?03/16/2022 12:46:36 PM ?This report has been signed electronically. ?Number of Addenda: 0 ?

## 2022-03-16 NOTE — Anesthesia Postprocedure Evaluation (Signed)
Anesthesia Post Note ? ?Patient: ALEJANDRO GAMEL ? ?Procedure(s) Performed: COLONOSCOPY WITH PROPOFOL ?ESOPHAGOGASTRODUODENOSCOPY (EGD) WITH PROPOFOL ?BIOPSY ? ?  ? ?Patient location during evaluation: PACU ?Anesthesia Type: MAC ?Level of consciousness: awake and alert ?Pain management: pain level controlled ?Vital Signs Assessment: post-procedure vital signs reviewed and stable ?Respiratory status: spontaneous breathing, nonlabored ventilation, respiratory function stable and patient connected to nasal cannula oxygen ?Cardiovascular status: stable and blood pressure returned to baseline ?Postop Assessment: no apparent nausea or vomiting ?Anesthetic complications: no ? ? ?No notable events documented. ? ?Last Vitals:  ?Vitals:  ? 03/16/22 1250 03/16/22 1300  ?BP: 136/89 (!) 153/87  ?Pulse: 82 88  ?Resp: (!) 34 17  ?Temp:    ?SpO2: 99% 97%  ?  ?Last Pain:  ?Vitals:  ? 03/16/22 1300  ?TempSrc:   ?PainSc: 0-No pain  ? ? ?  ?  ?  ?  ?  ?  ? ?Joby Richart S ? ? ? ? ?

## 2022-03-16 NOTE — H&P (Signed)
Date of Initial H&P: 03/02/22 ? ?History reviewed, patient examined, no change in status, stable for surgery.  ? ?

## 2022-03-16 NOTE — Transfer of Care (Signed)
Immediate Anesthesia Transfer of Care Note ? ?Patient: Joshua [name] ? ?Procedure(s) Performed: COLONOSCOPY WITH PROPOFOL ?ESOPHAGOGASTRODUODENOSCOPY (EGD) WITH PROPOFOL ?BIOPSY ? ?Patient Location: PACU and Endoscopy Unit ? ?Anesthesia Type:MAC ? ?Level of Consciousness: drowsy ? ?Airway & Oxygen Therapy: Patient Spontanous Breathing and Patient connected to face mask oxygen ? ?Post-op Assessment: Report given to RN and Post -op Vital signs reviewed and stable ? ?Post vital signs: Reviewed and stable ? ?Last Vitals:  ?Vitals Value Taken Time  ?BP    ?Temp    ?Pulse    ?Resp 10 03/16/22 1235  ?SpO2    ?Vitals shown include unvalidated device data. ? ?Last Pain:  ?Vitals:  ? 03/16/22 1019  ?TempSrc: Temporal  ?PainSc: 0-No pain  ?   ? ?  ? ?Complications: No notable events documented. ?

## 2022-03-16 NOTE — Interval H&P Note (Signed)
History and Physical Interval Note: ? ?03/16/2022 ?11:51 AM ? ?Joshua [name]  has presented today for surgery, with the diagnosis of History of colon polyps/Dysphagia.  The various methods of treatment have been discussed with the patient and family. After consideration of risks, benefits and other options for treatment, the patient has consented to  Procedure(s): ?COLONOSCOPY WITH PROPOFOL (N/A) ?ESOPHAGOGASTRODUODENOSCOPY (EGD) WITH PROPOFOL (N/A) ?SAVORY DILATION (N/A) as a surgical intervention.  The patient's history has been reviewed, patient examined, no change in status, stable for surgery.  I have reviewed the patient's chart and labs.  Questions were answered to the patient's satisfaction.   ? ? ?Lear Ng ? ? ?

## 2022-03-16 NOTE — Anesthesia Procedure Notes (Signed)
Procedure Name: [place] ?Date/Time: 03/16/2022 11:55 AM ?Performed by: Deliah Boston, CRNA ?Pre-anesthesia Checklist: Patient identified, Emergency Drugs available, Suction available and Patient being monitored ?Patient Re-evaluated:Patient Re-evaluated prior to induction ?Oxygen Delivery Method: Simple face mask ?Preoxygenation: Pre-oxygenation with 100% oxygen ?Induction Type: IV induction ?Placement Confirmation: positive ETCO2 and breath sounds checked- equal and bilateral ? ? ? ? ?

## 2022-03-16 NOTE — Anesthesia Preprocedure Evaluation (Signed)
Anesthesia Evaluation  ?Patient identified by MRN, date of birth, ID band ?Patient awake ? ? ? ?Reviewed: ?Allergy & Precautions, NPO status , Patient's Chart, lab work & pertinent test results ? ?Airway ?Mallampati: III ? ?TM Distance: <3 FB ?Neck ROM: Full ? ? ? Dental ?no notable dental hx. ? ?  ?Pulmonary ?sleep apnea and Continuous Positive Airway Pressure Ventilation , COPD,  ?  ?Pulmonary exam normal ?breath sounds clear to auscultation ? ? ? ? ? ? Cardiovascular ?hypertension, + CAD, + Past MI, + Cardiac Stents and +CHF  ?Normal cardiovascular exam ?Rhythm:Regular Rate:Normal ? ?Left ventricular ejection fraction, by estimation, is 55 to 60%. The  ?left ventricle has low normal function. Left ventricular endocardial  ?border not optimally defined to evaluate regional wall motion. There is  ?mild concentric left ventricular  ?hypertrophy. Left ventricular diastolic parameters are consistent with  ?Grade II diastolic dysfunction (pseudonormalization).  ??2. Right ventricular systolic function is normal. The right ventricular  ?size is normal. Tricuspid regurgitation signal is inadequate for assessing  ?PA pressure.  ??3. Left atrial size was mild to moderately dilated.  ??4. The mitral valve is normal in structure. No evidence of mitral valve  ?regurgitation. No evidence of mitral stenosis.  ??5. The aortic valve was not well visualized. Aortic valve regurgitation  ?is not visualized. No aortic stenosis is present.  ??6. The inferior vena cava is normal in size with greater than 50%  ?respiratory variability, suggesting right atrial pressure ?  ?Neuro/Psych ?negative neurological ROS ? negative psych ROS  ? GI/Hepatic ?Neg liver ROS, GERD  ,  ?Endo/Other  ?diabetesMorbid obesity ? Renal/GU ?negative Renal ROS  ?negative genitourinary ?  ?Musculoskeletal ?negative musculoskeletal ROS ?(+)  ? Abdominal ?  ?Peds ?negative pediatric ROS ?(+)  Hematology ?negative hematology  ROS ?(+)   ?Anesthesia Other Findings ? ? Reproductive/Obstetrics ?negative OB ROS ? ?  ? ? ? ? ? ? ? ? ? ? ? ? ? ?  ?  ? ? ? ? ? ? ? ? ?Anesthesia Physical ?Anesthesia Plan ? ?ASA: 3 ? ?Anesthesia Plan: MAC  ? ?Post-op Pain Management: Minimal or no pain anticipated  ? ?Induction: Intravenous ? ?PONV Risk Score and Plan: 1 and Propofol infusion and Treatment may vary due to age or medical condition ? ?Airway Management Planned: Simple Face Mask ? ?Additional Equipment:  ? ?Intra-op Plan:  ? ?Post-operative Plan:  ? ?Informed Consent: I have reviewed the patients History and Physical, chart, labs and discussed the procedure including the risks, benefits and alternatives for the proposed anesthesia with the patient or authorized representative who has indicated his/her understanding and acceptance.  ? ? ? ?Dental advisory given ? ?Plan Discussed with: CRNA and Surgeon ? ?Anesthesia Plan Comments:   ? ? ? ? ? ? ?Anesthesia Quick Evaluation ? ?

## 2022-03-16 NOTE — Op Note (Addendum)
[facility] ?Patient Name: Joshua [name] ?Procedure Date: 03/16/2022 ?MRN: [id number] ?Attending MD: Lear Ng , MD ?Date of Birth: [birth year] ?CSN: [id number] ?Age: 66 ?Admit Type: Outpatient ?Procedure:                Upper GI endoscopy ?Indications:              Dysphagia, Esophageal reflux ?Providers:                Lear Ng, MD, Jaci Carrel, RN,  ?                          Despina Pole, Technician, Heide Scales, CRNA ?Referring MD:             [name] ?Medicines:                Propofol per Anesthesia, Monitored Anesthesia Care ?Complications:            No immediate complications. ?Estimated Blood Loss:     Estimated blood loss was minimal. ?Procedure:                Pre-Anesthesia Assessment: ?                          - Prior to the procedure, a History and Physical  ?                          was performed, and patient medications and  ?                          allergies were reviewed. The patient's tolerance of  ?                          previous anesthesia was also reviewed. The risks  ?                          and benefits of the procedure and the sedation  ?                          options and risks were discussed with the patient.  ?                          All questions were answered, and informed consent  ?                          was obtained. Prior Anticoagulants: The patient has  ?                          taken Brilinta, last dose was 5 days prior to  ?                          procedure. ASA Grade Assessment: III - A patient  ?                          with severe systemic disease. After reviewing the  ?  risks and benefits, the patient was deemed in  ?                          satisfactory condition to undergo the procedure. ?                          After obtaining informed consent, the endoscope was  ?                          passed under direct vision. Throughout the  ?                          procedure, the  patient's blood pressure, pulse, and  ?                          oxygen saturations were monitored continuously. The  ?                          [id number]-[id number] ([id number] Olympus endoscope was introduced  ?                          through the mouth, and advanced to the second part  ?                          of duodenum. The upper GI endoscopy was  ?                          accomplished without difficulty. The patient  ?                          tolerated the procedure well. ?Scope In: ?Scope Out: ?Findings: ?     Mucosal changes including longitudinal furrows were found in the distal  ?     esophagus. Biopsies were taken with a cold forceps for histology.  ?     Estimated blood loss was minimal. ?     The Z-line was regular and was found 46 cm from the incisors. ?     A large amount of food (residue) was found in the gastric body. ?     The exam of the stomach was otherwise normal. ?     The cardia and gastric fundus were normal on retroflexion. ?     Scattered medium mucosal nodules with a patchy distribution were found  ?     in the second portion of the duodenum. Biopsies were taken with a cold  ?     forceps for histology. Estimated blood loss was minimal. ?     The exam of the duodenum was otherwise normal. ?Impression:               - Esophageal mucosal changes suspicious for  ?                          eosinophilic esophagitis. Biopsied. ?                          - Z-line regular, 46 cm from the incisors. ?                          -  A large amount of food (residue) in the stomach. ?                          - Mucosal nodule found in the duodenum. Biopsied. ?Moderate Sedation: ?     N/A - MAC procedure ?Recommendation:           - Patient has a contact number available for  ?                          emergencies. The signs and symptoms of potential  ?                          delayed complications were discussed with the  ?                          patient. Return to normal activities tomorrow.  ?                           Written discharge instructions were provided to the  ?                          patient. ?                          - Follow an antireflux regimen. ?                          - Await pathology results. ?                          - Resume Brilinta at prior dose (see other  ?                          procedure note). ?Procedure Code(s):        --- Professional --- ?                          [phone], Esophagogastroduodenoscopy, flexible,  ?                          transoral; with biopsy, single or multiple ?Diagnosis Code(s):        --- Professional --- ?                          R13.10, Dysphagia, unspecified ?                          K21.9, Gastro-esophageal reflux disease without  ?                          esophagitis ?                          K22.8, Other specified diseases of esophagus ?                          K31.89, Other diseases of stomach and duodenum ?CPT  copyright 2019 American Medical Association. All rights reserved. ?The codes documented in this report are preliminary and upon coder review may  ?be revised to meet current compliance requirements. ?Lear Ng, MD ?03/16/2022 12:39:13 PM ?This report has been signed electronically. ?Number of Addenda: 0 ?

## 2022-03-17 ENCOUNTER — Encounter (HOSPITAL_COMMUNITY): Payer: Self-pay | Admitting: Gastroenterology

## 2022-03-17 LAB — SURGICAL PATHOLOGY

## 2022-03-22 ENCOUNTER — Encounter: Payer: Medicare Other | Attending: Internal Medicine | Admitting: Dietician

## 2022-03-22 ENCOUNTER — Encounter: Payer: Self-pay | Admitting: Dietician

## 2022-03-22 DIAGNOSIS — E1159 Type 2 diabetes mellitus with other circulatory complications: Secondary | ICD-10-CM | POA: Diagnosis not present

## 2022-03-22 DIAGNOSIS — Z713 Dietary counseling and surveillance: Secondary | ICD-10-CM | POA: Diagnosis not present

## 2022-03-22 DIAGNOSIS — E785 Hyperlipidemia, unspecified: Secondary | ICD-10-CM | POA: Insufficient documentation

## 2022-03-22 DIAGNOSIS — Z6841 Body Mass Index (BMI) 40.0 and over, adult: Secondary | ICD-10-CM | POA: Insufficient documentation

## 2022-03-22 DIAGNOSIS — E1142 Type 2 diabetes mellitus with diabetic polyneuropathy: Secondary | ICD-10-CM

## 2022-03-22 NOTE — Progress Notes (Signed)
? ?Medical Nutrition Therapy  ?Appointment Start time:  [phone]  Appointment End time:  0845 ?Patient is here today alone.  He was last seen by this RD on 12/28/2021. ? ?Primary concerns today: follow up  ?Referral diagnosis: Type 2 Diabetes ?Preferred learning style: no preference indicated ?Learning readiness: contemplating  ? ?NUTRITION ASSESSMENT  ?He has been wearing the [place] for the past month.   ?A1C remains very high. ?Fasting sensor readings 150's and highest of 263 after a meal/snack.  He is getting some lows as a result of taking the insulin after a meal and/or not eating enough. ?Feels full and gets full easily/frequently. ?Better control of his blood glucose now that he is aware of this. ?Takes the Humalog most often after his meals. ?Tried endoscopy and colonoscopy but stomach was full of food and intestines full of fecal matter so these test will need to be redone per patient. ?Chronic constipation.  Some biopsies completed. ?BM frequently once per week. ?In January he started a cap of Murelax daily and 1 benefiber.  More recently stopped the Murelax. ?Problems getting his medications filled at times (metoprolol, lasix)) ?Eye doctor is fearful that he has the beginnings of glaucoma and is now taking drops for this. ?Sleep remains poor ?Not exercising frequently ?He is now taking the Ozempic correctly. ? ?Anthropometrics  ?309 lbs 03/22/2022 ?304 lbs 12/28/2021 ?324 lbs 09/21/2021 ?322 lbs 07/26/2021 ?312 lbs 07/21/2021 ?302 lbs 06/17/2021 ?314 lbs 05/25/2021 ?311 lbs 03/02/2021  "weight fluctuates 10 lbs - fluid retention") ?311 lbs 12/22/2020 per patient at another MD ?293 lbs 08/26/2020 ?297 lbs 07/21/2020 ?300 lbs 06/03/2020 ?309 lbs 05/15/2020 (increased due to fluid) ?306 lbs 05/06/2020 ?312 lbs 04/10/2020 (not eating much, presumed fluid gain based on examination) ?300 lbs 03/06/2020 ?319 lbs 01/28/20 ?304 lbs 04/2019 ?323 lbs about 02/14/20 highest resent weight ?195 lbs lowest adult weight freshman in college during  increased football practice  ?  ?Clinical ?Medical Hx: Type 2 diabetes, CHF, CKD4, gastroparesis, OSA with new mask  ?Medications: Lantus 30 units bid, Humalog sliding scale twice per day ( 12-18 units), Ozempic, vitamin D, Lasix, Farxiga ?CGM - FreeStyle [place] 2 ?Labs: A1C increased to 11.2% on 10/28/21 from 9.2% 08/11/2021, 9.1% 06/17/2021, 8.3% 02/10/2021,  7.5% 08/15/2020, 8.9% 05/15/20 decreased from 13.8% 01/28/2020. eGFR 28, BUN 33, Creatinine 2.55, Potassium 3.5 on 10/28/2021 ?Notable Signs/Symptoms: none today.  Walking improved ? ?Lifestyle & Dietary Hx ?Patient lives with his wife and three sons.  His son is paying the mortgage and helps out a lot with finances.  His wife has mobility issus and has paperwork to complete disability.  Shopping causes him increased physical pain.  He finds it difficult to meet his own nutritional needs as well as the preferences of his family and overall is not cooking.  Son brings in take out frequently. ?Rely on food assistance as needed. 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 ?Middle son has type 2 diabetes and recently diagnosed. ?-Financially, things are still difficult but his son is helping a lot financially and is getting a food card from [employer]. ?-Son's are now more supportive re:  healthier food choices  ?  ?Estimated daily fluid intake: States MD stated to limit to 2 Liters daily.    ?Supplements: vitamin D ?Sleep: poor quality "not deep:, only about 5 hours at night.  C-pap is working better. ?Stress / self-care: financial, disability of wife, difficulty with stress ?Current average weekly physical activity: none- see above ?  ?  24-Hr Dietary Recall ?First Meal: usually skips ?Snack: none ?Second Meal: ham biscuit, Bo Rounds, 1% milk OR 2 slices pizza ?Snack: pear ?Third Meal: Lima beans OR cheddar cheese and ritz crackers ?Snack: 3 slices of cheddar cheese, apple ?Beverages: water, unsweetened tea with splenda, sprite zero  ?  ?NUTRITION DIAGNOSIS   ?NB-1.1 Food and nutrition-related knowledge deficit As related to balance of carbohydrate, protein, and fat.  As evidenced by diet hx and patient report. ?  ?  ?NUTRITION INTERVENTION  ?Nutrition education (E-1) on the following topics:  ?Ozempic instruction ?Humalog use for correction of high blood sugar and importance of consistent meals so this Humalog can be given more consistently ?Importance of controlling blood glucose and blood pressure for kidney health ?Low sodium ?Adequate fluid ?Increasing vegetables ?Decreasing fast food ?  ?Handouts Provided Include (previous visit) ?Low sodium nutrition therapy from AND ?  ?Learning Style & Readiness for Change ?Teaching method utilized: Visual & Auditory  ?Demonstrated degree of understanding via: Teach Back  ?Barriers to learning/adherence to lifestyle change: finances, difficulty cooking ?  ?Goals ?Call me when you get the [facility] for training. ?Call [facility] [phone] if you have not received the [facility] in a week or 2. ? ?Continue to take your medication as prescribed. ?You have not been taking the Ozempic correctly.  Start as below. ? 0.25 mg once per week for 4 weeks then ? 0.5 mg once per week for 4 weeks then ? 0.75 mg once per week for 4 weeks then ? 1 mg once per week (goal) ?Consistent meals ?Decrease fast food ?Low sodium (avoid processed meat, added salt) ?Increase vegetables ?More water ?  ?MONITORING & EVALUATION ?Dietary intake, weekly physical activity, and label reading in 2-3 months. ?  ?Next Steps  ?Patient is to call as needed. ? ?

## 2022-03-22 NOTE — Patient Instructions (Addendum)
Consider 2 Tablespoons of ground flax seeds daily (add to oatmeal or yogurt) - store in the freezer.  OR 2 T chia seeds (soaked in liquid such as almond milk) ? ?Continue to take your medication as prescribed. ?You do not need to keep your insulin pens that you are using in the refrigerator.  Aim to take the Humalog before you eat. ? ?Low sodium diet. ?Consistent meals ?Lean protein, non-starchy vegetables, beans, sweet potatoes, fresh fruit ? ?Eat more Non-Starchy Vegetables ?These include greens, broccoli, cauliflower, cabbage, carrots, beets, eggplant, peppers, squash and others. ?Minimize added sugars and refined grains ?Rethink what you drink.  Choose beverages without added sugar.  Look for 0 carbs on the label. ?See the list of whole grains below.  Find alternatives to usual sweet treats. ?Choose whole foods over processed. ?Make simple meals at home more often than eating out. ? ?Tips to increase fiber in your diet: ?(All plants have fiber.  Eat a variety. There are more than are on this list.) ?Slowly increase the amount of fiber you eat to 25-35 grams per day.  (More is fine if you tolerate it.) ?Fiber from whole grains, nuts and seeds ?Quinoa, 1/2 cup = 5 grams ?Bulgur, 1/2 cup = 4.1 grams ?Popcorn, 3 cups = 3.6 grams ?Whole Wheat Spaghetti, 1/2 cup = 3.2 grams ?Barley, 1/2 cup = 3 grams ?Oatmeal, 1/2 cup = 2 grams ?Whole Wheat English Muffin = 3 grams ?Corn, 1/2 cup = 2.1 grams ?Brown Rice, 1/2 cup = 1.8 grams ?Flax seeds, 1 Tablespoon = 2.8 grams ?Chia seeds, 1 Tablespoon = 11 grams ?Almonds, 1 ounce = 3.5 grams fiber ?Fiber from legumes ?Kidney beans, 1/2 cup 7.9 grams ?Lentils, 1/2 cup = 7.8 grams ?Pinto beans, 1/2 cup = 7.7 grams ?Black beans, 1/2 cup = 7.6 grams ?Lima beans, 1/2 cup 6.4 grams ?Chick peas, 1/2 cup = 5.3 grams ?Black eyed peas, 1/2 cup = 4 grams ?Fiber from fruits and vegetables ?Pear, 6 grams ?Apple. 3.3 grams ?Raspberries or Blackberries, 3/4 cup = 6 grams ?Strawberries or  Blueberries, 1 cup = 3.4 grams ?Baked sweet potato 3.8 grams fiber ?Baked potato with skin 4.4 grams  ?Peas, 1/2 cup = 4.4 grams  ?Spinach, 1/2 cup cooked = 3.5 grams  ?Avocado, 1/2 = 5 grams ? ? ? ? ?  ?

## 2022-04-04 ENCOUNTER — Other Ambulatory Visit: Payer: Self-pay | Admitting: Cardiovascular Disease

## 2022-04-06 ENCOUNTER — Other Ambulatory Visit: Payer: Self-pay | Admitting: Cardiovascular Disease

## 2022-04-06 MED ORDER — ISOSORBIDE MONONITRATE ER 60 MG PO TB24: 180.0000 mg | ORAL_TABLET | Freq: Every day | ORAL | 11 refills | Status: DC

## 2022-04-08 DIAGNOSIS — E7800 Pure hypercholesterolemia, unspecified: Secondary | ICD-10-CM | POA: Diagnosis not present

## 2022-04-08 DIAGNOSIS — N184 Chronic kidney disease, stage 4 (severe): Secondary | ICD-10-CM | POA: Diagnosis not present

## 2022-04-08 DIAGNOSIS — I129 Hypertensive chronic kidney disease with stage 1 through stage 4 chronic kidney disease, or unspecified chronic kidney disease: Secondary | ICD-10-CM | POA: Diagnosis not present

## 2022-04-08 DIAGNOSIS — G4733 Obstructive sleep apnea (adult) (pediatric): Secondary | ICD-10-CM | POA: Diagnosis not present

## 2022-04-08 DIAGNOSIS — I25119 Atherosclerotic heart disease of native coronary artery with unspecified angina pectoris: Secondary | ICD-10-CM | POA: Diagnosis not present

## 2022-04-08 DIAGNOSIS — M109 Gout, unspecified: Secondary | ICD-10-CM | POA: Diagnosis not present

## 2022-04-08 DIAGNOSIS — K2100 Gastro-esophageal reflux disease with esophagitis, without bleeding: Secondary | ICD-10-CM | POA: Diagnosis not present

## 2022-04-08 DIAGNOSIS — I5022 Chronic systolic (congestive) heart failure: Secondary | ICD-10-CM | POA: Diagnosis not present

## 2022-04-08 DIAGNOSIS — M7989 Other specified soft tissue disorders: Secondary | ICD-10-CM | POA: Diagnosis not present

## 2022-04-08 DIAGNOSIS — G629 Polyneuropathy, unspecified: Secondary | ICD-10-CM | POA: Diagnosis not present

## 2022-04-22 ENCOUNTER — Ambulatory Visit (INDEPENDENT_AMBULATORY_CARE_PROVIDER_SITE_OTHER): Payer: Medicare Other | Admitting: Internal Medicine

## 2022-04-22 ENCOUNTER — Encounter: Payer: Self-pay | Admitting: Internal Medicine

## 2022-04-22 VITALS — BP 150/92 | HR 88 | Ht 69.0 in | Wt 305.8 lb

## 2022-04-22 DIAGNOSIS — E1165 Type 2 diabetes mellitus with hyperglycemia: Secondary | ICD-10-CM

## 2022-04-22 DIAGNOSIS — E1159 Type 2 diabetes mellitus with other circulatory complications: Secondary | ICD-10-CM | POA: Diagnosis not present

## 2022-04-22 DIAGNOSIS — E785 Hyperlipidemia, unspecified: Secondary | ICD-10-CM

## 2022-04-22 LAB — POCT GLYCOSYLATED HEMOGLOBIN (HGB A1C): Hemoglobin A1C: 8.1 % — AB (ref 4.0–5.6)

## 2022-04-22 NOTE — Progress Notes (Signed)
[name] ID: Joshua [name], male   DOB: 01-Dec-1956, 66 y.o.   MRN: [id number]  ? ?This visit occurred during the SARS-CoV-2 public health emergency.  Safety protocols were in place, including screening questions prior to the visit, additional usage of staff PPE, and extensive cleaning of exam room while observing appropriate contact time as indicated for disinfecting solutions.  ? ?HPI: ?Joshua [name] is a 66 y.o.-year-old male, initially referred by his PCP, Dr. Alyson Ingles, returning for follow-up for DM2, dx in 1990s, insulin-dependent, uncontrolled, with complications (CAD - s/p AMI 2012, s/p PCIs, ischemic cardiomyopathy, CHF, CKD stage 4, PN - s/p R transmetatarsal amputation, gastroparesis, DR, history of DKA). He saw Dr Dwyane Dee initially, then Dr. Chalmers Cater for 20 years, but she was not accepting Medicaid patients anymore so he had to switch providers.  Last visit with me 3 months ago. ?He now has [employer]. ? ?Interim history: ?No increased urination, blurry vision, nausea (aside an episode of gastroenteritis), chest pain. ?He has a 66 y.o fluid restriction as he has leg swelling. ?He continues to have back pain. ? ?Reviewed HbA1c levels: He continues to have back pain.  He saw orthopedics in the past ?Lab Results  ?Component Value Date  ? HGBA1C 12.9 (A) 01/21/2022  ? HGBA1C 11.2 (A) 10/28/2021  ? HGBA1C 9.2 (H) 08/11/2021  ? HGBA1C 9.1 (A) 06/17/2021  ? HGBA1C 8.3 (A) 02/10/2021  ? HGBA1C 7.5 (A) 08/15/2020  ? HGBA1C 8.9 (A) 05/15/2020  ? HGBA1C 13.8 (A) 01/28/2020  ? HGBA1C 10.2 (H) 08/16/2019  ? HGBA1C 15.0 (H) 04/07/2019  ? ?He was on: ?- Lantus 20 units 2x a day ?- NovoLog 10 units 3x a day, before meals ?Prev. On 70/30 until Spring 2020. ? ?Currently on: ?- Basaglar 30 >> 20-25 >> 30 units a day 2x a day >> Lantus  ?- Humalog 10-15 >> 10-20 >> 10-12 >> 18 >> 15-20 units before meals  ?- Ozempic 1 mg weekly (started to take it correctly only after 12/29/2021 after he saw the diabetes  educator) ?Previously on Lantus, NovoLog, Victoza. ? ?He is now checking blood sugars >4x a day with his his new CGM: ? ? ? ? ?Prev.: ?- am: 163, 290, 329 >> 84 >> n/c >> 120, 171 >> 67, 100-157, 218 ?- 2h after b'fast: n/c >> 241 ?- before lunch: 140-180 >> 99, 134, 191-216 >> 115 ?- 2h after lunch: n/c >> 274 >> n/c >> 304 >> 190 ?- before dinner: n/c >> 134, 217 >> 151 >> n/c >> 201 ?- 2h after dinner: n/c >> 145 >> 275 ?- bedtime: 215- 500s >> 189, 268, 309 >> 92, 98, 235 ?- nighttime: n/c >> HI >> 73-215 >> 121 >> n/c ?Lowest sugar was 50 >> ...>> 60s (took insulin after he ate) >> 99 >> 60 >> 60 (correction of a high); he has hypoglycemia awareness at 100.  ?Highest sugar was 538 >> 389 >> 320 >> 251.  ? ?Glucometer: ReliOn ? ?Pt's meals are: ?- Breakfast: skips ?- Lunch: may skip, PB or chicken ?- Dinner: chicken w/o sides or with + veggies + starch;  ?- Snacks: occasional sweets: Hershey bar, sweet tea, cool aid ? ?-+ CKD stage IV- Dr [name], last BUN/creatinine:  ?Lab Results  ?Component Value Date  ? BUN 33 (H) 10/26/2021  ? BUN 39 (H) 08/11/2021  ? CREATININE 2.55 (H) 10/26/2021  ? CREATININE 2.44 (H) 08/11/2021  ?10/18/2019: 24/2.92 ?10/18/2019: ACR 765.3 ? ?Lab Results  ?Component Value Date  ?  GFRAA 27 (L) 08/23/2019  ? GFRAA 25 (L) 08/22/2019  ? GFRAA 24 (L) 08/21/2019  ? GFRAA 22 (L) 08/20/2019  ? GFRAA 29 (L) 08/19/2019  ?Not on ACE inhibitor/ARB ? ?-+ HL; last set of lipids: ?Lab Results  ?Component Value Date  ? CHOL 283 (H) 08/11/2021  ? HDL 89 08/11/2021  ? LDLCALC 169 (H) 08/11/2021  ? TRIG 145 08/11/2021  ? CHOLHDL 2.0 08/17/2019  ?09/05/2020: 112/137/38/49 ?10/18/2019: 143/105/53/69 ?On Crestor 40 - restarted 07/2021.  Previously on Zetia, also. ? ?- last eye exam was in 12/2021: reportedly + DR, + possible glaucoma. Had cataract sx in 2014, 2015. ? ?-+ numbness in his feet.  He also has significant back pain.  He sees neurology for peripheral neuropathy, leg pain, instability.  Now on  gabapentin.  Last foot exam 01/2022. ? ?On ASA 81. ? ?Latest TSH:  ?Lab Results  ?Component Value Date  ? TSH 2.890 08/11/2021  ? ?Pt has FH of DM in [place]. ? ?He also has a history of HTN, OSA, GERD, esophageal ulcers. ?No h/o pancreatitis or FH of MTC. ? ?He was in a play in [place]. ? ?ROS: ?See HPI ? ?I reviewed pt's medications, allergies, PMH, social hx, family hx, and changes were documented in the history of present illness. Otherwise, unchanged from my initial visit note. ? ?Past Medical History:  ?Diagnosis Date  ? Acute coronary syndrome (HCC)   ? Anemia   ? B12 deficiency   ? Chest pain   ? CHF (congestive heart failure) ([place])   ? Chronic combined systolic and diastolic CHF (congestive heart failure) ([place])   ? a. 07/2015 Echo: EF 45-50%, mod LVH, mid apical/antsept AK, Gr 1 DD.  ? Chronic low back pain   ? CKD (chronic kidney disease), stage III ([place])   ? Constipation   ? COPD (chronic obstructive pulmonary disease) ([place])   ? Coronary artery disease   ? a. 2012 s/p MI/PCI: s/p DES to mid/dist RCA and overlapping DES to LAD;  b. 07/2015 Cath: LAD 10% ISR, D1 40(jailed), LCX 68m, OM2 30, OM3 30, RCA 31m ISR, 10d ISR; c. 07/2016 NSTEMI/PCI: LAD 85ost/p/m (3.5x20 Synergy DES overlapping prior stent), D1 40, LCX 23m, OM2 95 (2.75x16 Synergy DES), OM3 30, RCA 20m, 10d.  ? Depression   ? Diabetic gastroparesis ([place])   ? Diabetic polyneuropathy ([place])   ? DKA (diabetic ketoacidoses) 03/14/2018  ? GERD (gastroesophageal reflux disease)   ? Gout   ? Heart failure ([name])   ? Heart murmur   ? History of MI (myocardial infarction)   ? Hyperlipemia   ? Hypertension   ? Hypertensive heart disease   ? Hypokalemia   ? Ischemic cardiomyopathy   ? a. 07/2015 Echo: EF 45-50%, mod LVH, mid-apicalanteroseptal AK, Gr1 DD.  ? Joint pain   ? Kidney problem   ? Morbid obesity ([place])   ? OSA on CPAP   ? Osteoarthritis   ? Osteomyelitis ([place])   ? a. 07/2016 MTP joint of R great toe s/p transmetatarsal amputation.  ? Other fatigue   ?  Polyneuropathy in diabetes(357.2)   ? Pulmonary sarcoidosis ([place])   ? "problems w/it years ago; no problems anymore" (07/03/2014)  ? Rectal bleeding   ? Shortness of breath   ? Shortness of breath on exertion   ? Sleep apnea   ? Stomach ulcer   ? Swallowing difficulty   ? Type II diabetes mellitus ([place])   ? ?Past Surgical History:  ?Procedure  Laterality Date  ? AMPUTATION Right 07/24/2016  ? Procedure: TRANSMETATARSAL FOOT AMPUTATION;  Surgeon: [name], MD;  Location: [facility];  Service: Orthopedics;  Laterality: Right;  ? BALLOON DILATION N/A 03/21/2018  ? Procedure: BALLOON DILATION;  Surgeon: [name], MD;  Location[facility] ENDOSCOPY;  Service: Endoscopy;  Laterality: N/A;  ? BIOPSY  03/16/2022  ? Procedure: BIOPSY;  Surgeon: [name], MD;  Location: [facility] ENDOSCOPY;  Service: Endoscopy;;  ? BREAST LUMPECTOMY Right ~ 2000  ? "benign"  ? CARDIAC CATHETERIZATION N/A 07/30/2015  ? Procedure: Left Heart Cath and Coronary Angiography;  Surgeon: Burnell Blanks, MD;  Location: [facility] CV LAB;  Service: Cardiovascular;  Laterality: N/A;  ? CARDIAC CATHETERIZATION N/A 07/19/2016  ? Procedure: Left Heart Cath and Coronary Angiography;  Surgeon: Belva Crome, MD;  Location: [place] [facility] CV LAB;  Service: Cardiovascular;  Laterality: N/A;  ? CARDIAC CATHETERIZATION N/A 07/29/2016  ? Procedure: Coronary Stent Intervention;  Surgeon: Belva Crome, MD;  Location: [facility] CV LAB;  Service: Cardiovascular;  Laterality: N/A;  ? CARDIAC CATHETERIZATION N/A 07/29/2016  ? Procedure: Intravascular Ultrasound/IVUS;  Surgeon: Belva Crome, MD;  Location: [place] [facility] LAB;  Service: Cardiovascular;  Laterality: N/A;  ? CATARACT EXTRACTION W/ INTRAOCULAR LENS  IMPLANT, BILATERAL Bilateral 2014-2015  ? COLONOSCOPY WITH PROPOFOL N/A 08/22/2017  ? Procedure: COLONOSCOPY WITH PROPOFOL;  Surgeon: [name], MD;  Location: [facility];  Service: Endoscopy;  Laterality: N/A;  ? COLONOSCOPY WITH PROPOFOL N/A 03/16/2022   ? Procedure: COLONOSCOPY WITH PROPOFOL;  Surgeon: [name], MD;  Location: [facility] ENDOSCOPY;  Service: Endoscopy;  Laterality: N/A;  ? CORONARY ANGIOPLASTY WITH STENT PLACEMENT  10/31/2011  ? 30 % MID ATRIOVEN

## 2022-04-22 NOTE — Patient Instructions (Signed)
Please continue: ?- Basaglar 30 units a day 2x a day ?- Humalog 15-20 units 15 minutes before meals ?- Ozempic 1 mg weekly ? ?Please come back for a follow-up appointment in 4 months. ?

## 2022-05-04 ENCOUNTER — Ambulatory Visit (INDEPENDENT_AMBULATORY_CARE_PROVIDER_SITE_OTHER): Payer: Medicare Other | Admitting: Orthopaedic Surgery

## 2022-05-04 ENCOUNTER — Encounter: Payer: Self-pay | Admitting: Orthopaedic Surgery

## 2022-05-04 ENCOUNTER — Ambulatory Visit: Payer: Self-pay

## 2022-05-04 VITALS — BP 135/82 | Ht 69.0 in | Wt 309.0 lb

## 2022-05-04 DIAGNOSIS — M545 Low back pain, unspecified: Secondary | ICD-10-CM | POA: Diagnosis not present

## 2022-05-04 DIAGNOSIS — G8929 Other chronic pain: Secondary | ICD-10-CM

## 2022-05-04 NOTE — Progress Notes (Signed)
Office Visit Note   Patient: Joshua [name]           Date of Birth: 1956-08-30           MRN: [id number] Visit Date: 05/04/2022              Requested by: [name], MD [place],  [place] [address] PCP: [name], MD   Assessment & Plan: Visit Diagnoses:  1. Chronic bilateral low back pain, unspecified whether sciatica present     Plan: We discussed working on weight loss and improve diabetic control.  With his past uncontrolled diabetes and periods of hyperglycemia with peripheral neuropathy we discussed ambulation problems associated with this as well as fall prevention.  Patient had an MRI 1 year ago which showed multilevel degenerative changes with prominent epidural fat and some facet arthropathy with foraminal narrowing but no areas of severe compression.  Patient states she will try hard to work on better diabetic control and weight loss.  Follow-Up Instructions: No follow-ups on file.   Orders:  Orders Placed This Encounter  Procedures   XR Lumbar Spine 2-3 Views   No orders of the defined types were placed in this encounter.     Procedures: No procedures performed   Clinical Data: No additional findings.   Subjective: Chief Complaint  Patient presents with   Lower Back - Pain    HPI 66 year old male 7-month follow-up states he has been doing fairly well at about 3 weeks ago.  Pain started back it was more severe last night he could not get out of the car could barely raise his leg and had difficulty walking.  Today feels like it is better.  His activity level has picked up the last several months.  He has had neuropathy and times when his compliance with diabetes is extremely poor with A1c 12.9.  Recently this has been improved with 12 days ago A1c was 8.1.  Denies problems with his feet.  He still having some difficulty getting from sitting standing.  Morbid obesity BMI With insulin-dependent diabetes.  Possible  polyneuropathy.  Patient's been ambulating with a cane.  ROS: No bowel or bladder problems no fever chills no plantar foot lesions positive for his dependent diabetes with history of poorly controlled diabetes better recently.  Objective: Vital Signs: BP 135/82   Ht $R'5\' 9"'oW$  (1.753 m)   Wt (!) 309 lb (140.2 kg)   BMI 45.63 kg/m   Physical Exam Constitutional:      Appearance: He is well-developed.  HENT:     Head: Normocephalic and atraumatic.     Right Ear: External ear normal.     Left Ear: External ear normal.  Eyes:     Pupils: Pupils are equal, round, and reactive to light.  Neck:     Thyroid: No thyromegaly.     Trachea: No tracheal deviation.  Cardiovascular:     Rate and Rhythm: Normal rate.  Pulmonary:     Effort: Pulmonary effort is normal.     Breath sounds: No wheezing.  Abdominal:     General: Bowel sounds are normal.     Palpations: Abdomen is soft.  Musculoskeletal:     Cervical back: Neck supple.  Skin:    General: Skin is warm and dry.     Capillary Refill: Capillary refill takes less than 2 seconds.  Neurological:     Mental Status: He is alert and oriented to person, place, and time.  Psychiatric:        Behavior: Behavior normal.        Thought Content: Thought content normal.        Judgment: Judgment normal.    Ortho Exam negative logroll hips she is slow getting from sitting standing can ambulate with his cane.  Anterior tib gastrocsoleus is active no plantar foot lesions.  Right transmet amputation.  Specialty Comments:  No specialty comments available.  Imaging: No results found.   PMFS History: Patient Active Problem List   Diagnosis Date Noted   Dysphagia 03/16/2022   History of colonic polyps 03/16/2022   Spinal stenosis of lumbar region 11/03/2021   Mixed diabetic hyperlipidemia associated with type 2 diabetes mellitus ([place]) 08/11/2021   Chronic low back pain    Hypokalemia 09/25/2020   Type 2 diabetes mellitus with  hyperglycemia, with long-term current use of insulin ([place]) 09/24/2020   Unstable angina ([place]) 08/17/2019   Chest pain 08/16/2019   NSTEMI (non-ST elevated myocardial infarction) ([place]) 04/06/2019   ACS (acute coronary syndrome) ([place]) 04/06/2019   Sepsis ([place]) 03/15/2018   DKA (diabetic ketoacidoses) 03/15/2018   Special screening for malignant neoplasms, colon 08/22/2017   Acute renal failure superimposed on stage 3 chronic kidney disease ([place]) 05/19/2017   Hypertension associated with diabetes ([place])    Status post transmetatarsal amputation of foot, right ([place]) 11/08/2016   Diabetic polyneuropathy associated with type 2 diabetes mellitus ([place]) 11/08/2016   Pure hypercholesterolemia    AKI (acute kidney injury) ([place])    Acute blood loss anemia    DM type 2 with diabetic peripheral neuropathy ([place])    Physical debility 08/03/2016   Chronic osteomyelitis of right foot (HCC)    Cellulitis and abscess of leg, except foot    Penetrating wound of right foot    CKD (chronic kidney disease) stage 3, GFR 30-59 ml/min (HCC)    Unstable angina pectoris ([place]) 07/18/2016   Chronic combined systolic and diastolic congestive heart failure ([place])    Chest pain with moderate risk for cardiac etiology 06/24/2014   CAD S/P multiple PCIs 06/24/2014   Sleep apnea- on C-pap 06/24/2014   Class 3 severe obesity with serious comorbidity and body mass index (BMI) of 45.0 to 49.9 in adult [facility]) 11/10/2013   Hyperglycemia 10/29/2013   Hyperlipidemia 01/12/2011   Benign essential HTN 01/12/2011   GERD 01/12/2011   Past Medical History:  Diagnosis Date   Acute coronary syndrome (HCC)    Anemia    B12 deficiency    Chest pain    CHF (congestive heart failure) (HCC)    Chronic combined systolic and diastolic CHF (congestive heart failure) ([place])    a. 07/2015 Echo: EF 45-50%, mod LVH, mid apical/antsept AK, Gr 1 DD.   Chronic low back pain    CKD (chronic kidney disease), stage III (HCC)    Constipation     COPD (chronic obstructive pulmonary disease) (HCC)    Coronary artery disease    a. 2012 s/p MI/PCI: s/p DES to mid/dist RCA and overlapping DES to LAD;  b. 07/2015 Cath: LAD 10% ISR, D1 40(jailed), LCX 70m, OM2 30, OM3 30, RCA 58m ISR, 10d ISR; c. 07/2016 NSTEMI/PCI: LAD 85ost/p/m (3.5x20 Synergy DES overlapping prior stent), D1 40, LCX 60m, OM2 95 (2.75x16 Synergy DES), OM3 30, RCA 32m, 10d.   Depression    Diabetic gastroparesis ([place])    Diabetic polyneuropathy ([place])    DKA (diabetic ketoacidoses) 03/14/2018   GERD (gastroesophageal reflux disease)  Gout    Heart failure (HCC)    Heart murmur    History of MI (myocardial infarction)    Hyperlipemia    Hypertension    Hypertensive heart disease    Hypokalemia    Ischemic cardiomyopathy    a. 07/2015 Echo: EF 45-50%, mod LVH, mid-apicalanteroseptal AK, Gr1 DD.   Joint pain    Kidney problem    Morbid obesity ([place])    OSA on CPAP    Osteoarthritis    Osteomyelitis ([place])    a. 07/2016 MTP joint of R great toe s/p transmetatarsal amputation.   Other fatigue    Polyneuropathy in diabetes(357.2)    Pulmonary sarcoidosis ([place])    "problems w/it years ago; no problems anymore" (07/03/2014)   Rectal bleeding    Shortness of breath    Shortness of breath on exertion    Sleep apnea    Stomach ulcer    Swallowing difficulty    Type II diabetes mellitus ([place])     Family History  Problem Relation Age of Onset   Sudden death Mother    Diabetes Maternal Grandmother    Hypertension Maternal Grandmother    Heart attack Maternal Aunt    Sudden death Other     Past Surgical History:  Procedure Laterality Date   AMPUTATION Right 07/24/2016   Procedure: TRANSMETATARSAL FOOT AMPUTATION;  Surgeon: [name], MD;  Location: [facility];  Service: Orthopedics;  Laterality: Right;   BALLOON DILATION N/A 03/21/2018   Procedure: BALLOON DILATION;  Surgeon: [name], MD;  Location: [facility] ENDOSCOPY;  Service: Endoscopy;  Laterality: N/A;   BIOPSY   03/16/2022   Procedure: BIOPSY;  Surgeon: [name], MD;  Location: [facility] ENDOSCOPY;  Service: Endoscopy;;   BREAST LUMPECTOMY Right ~ 2000   "benign"   CARDIAC CATHETERIZATION N/A 07/30/2015   Procedure: Left Heart Cath and Coronary Angiography;  Surgeon: Burnell Blanks, MD;  Location: [facility] CV LAB;  Service: Cardiovascular;  Laterality: N/A;   CARDIAC CATHETERIZATION N/A 07/19/2016   Procedure: Left Heart Cath and Coronary Angiography;  Surgeon: Belva Crome, MD;  Location: [place] CV LAB;  Service: Cardiovascular;  Laterality: N/A;   CARDIAC CATHETERIZATION N/A 07/29/2016   Procedure: Coronary Stent Intervention;  Surgeon: Belva Crome, MD;  Location: [place] [facility] LAB;  Service: Cardiovascular;  Laterality: N/A;   CARDIAC CATHETERIZATION N/A 07/29/2016   Procedure: Intravascular Ultrasound/IVUS;  Surgeon: Belva Crome, MD;  Location: [place] CV LAB;  Service: Cardiovascular;  Laterality: N/A;   CATARACT EXTRACTION W/ INTRAOCULAR LENS  IMPLANT, BILATERAL Bilateral 2014-2015   COLONOSCOPY WITH PROPOFOL N/A 08/22/2017   Procedure: COLONOSCOPY WITH PROPOFOL;  Surgeon: [name], MD;  Location: [facility];  Service: Endoscopy;  Laterality: N/A;   COLONOSCOPY WITH PROPOFOL N/A 03/16/2022   Procedure: COLONOSCOPY WITH PROPOFOL;  Surgeon: [name], MD;  Location[facility] ENDOSCOPY;  Service: Endoscopy;  Laterality: N/A;   CORONARY ANGIOPLASTY WITH STENT PLACEMENT  10/31/2011   30 % MID ATRIOVENTRICULAR GROOVE CX STENOSIS. 90 % MID RCA.PTA TO LAD/STENTING OF TANDEM 60-70%. LAD STENOSIS 99% REDUCED TO 0%.3.0 X 32MM PROMUS DES POSTDILATED TO 3.25MM.   CORONARY ANGIOPLASTY WITH STENT PLACEMENT  07/03/2014   "2"   CORONARY STENT INTERVENTION N/A 04/09/2019   Procedure: CORONARY STENT INTERVENTION;  Surgeon: Jettie Booze, MD;  Location: [place] CV LAB;  Service: Cardiovascular;  Laterality: N/A;   CORONARY STENT INTERVENTION N/A 08/22/2019   Procedure:  CORONARY STENT INTERVENTION;  Surgeon: Martin[place],  Ander Slade, MD;  Location: [facility];  Service: Cardiovascular;  Laterality: N/A;   ESOPHAGOGASTRODUODENOSCOPY (EGD) WITH PROPOFOL N/A 03/21/2018   Procedure: ESOPHAGOGASTRODUODENOSCOPY (EGD) WITH PROPOFOL;  Surgeon: [name], MD;  Location: [facility];  Service: Endoscopy;  Laterality: N/A;   ESOPHAGOGASTRODUODENOSCOPY (EGD) WITH PROPOFOL N/A 03/16/2022   Procedure: ESOPHAGOGASTRODUODENOSCOPY (EGD) WITH PROPOFOL;  Surgeon: [name], MD;  Location[facility] ENDOSCOPY;  Service: Endoscopy;  Laterality: N/A;   I & D EXTREMITY Right 10/30/2013   Procedure: IRRIGATION AND DEBRIDEMENT RIGHT SMALL FINGER POSSIBLE SECONDARY WOUND CLOSURE;  Surgeon: [name], MD;  Location: [facility] ORS;  Service: Orthopedics;  Laterality: Right;  [name] is available after 4pm   I & D EXTREMITY Right 11/01/2013   Procedure:  IRRIGATION AND DEBRIDEMENT RIGHT  PROXIMAL PHALANGEAL LEVEL AMPUTATION;  Surgeon: [name], MD;  Location: [facility];  Service: Orthopedics;  Laterality: Right;   INCISION AND DRAINAGE Right 10/28/2013   Procedure: INCISION AND DRAINAGE Right small finger;  Surgeon: [name], MD;  Location: [facility];  Service: Orthopedics;  Laterality: Right;   LEFT HEART CATH Left 11/03/2011   Procedure: LEFT HEART CATH;  Surgeon: [name], MD;  [facility] CATH LAB;  Service: Cardiovascular;  Laterality: Left;   LEFT HEART CATH AND CORONARY ANGIOGRAPHY N/A 04/09/2019   Procedure: LEFT HEART CATH AND CORONARY ANGIOGRAPHY;  Surgeon: Jettie Booze, MD;  Location: [place] CV LAB;  Service: Cardiovascular;  Laterality: N/A;   LEFT HEART CATHETERIZATION WITH CORONARY ANGIOGRAM N/A 07/03/2014   Procedure: LEFT HEART CATHETERIZATION WITH CORONARY ANGIOGRAM;  Surgeon: [name], MD;  Location[facility] CATH LAB;  Service: Cardiovascular;  Laterality: N/A;   PERCUTANEOUS CORONARY STENT INTERVENTION (PCI-S) Left 11/03/2011    Procedure: PERCUTANEOUS CORONARY STENT INTERVENTION (PCI-S);  Surgeon: [name], MD;  Location: [facility] CATH LAB;  Service: Cardiovascular;  Laterality: Left;   RIGHT/LEFT HEART CATH AND CORONARY ANGIOGRAPHY N/A 08/22/2019   Procedure: RIGHT/LEFT HEART CATH AND CORONARY ANGIOGRAPHY;  Surgeon: Martinique, Peter M, MD;  Location: [place] [facility];  Service: Cardiovascular;  Laterality: N/A;   TOE AMPUTATION Right ~ 2010   "2nd toe"   TOE AMPUTATION Right 2012   "3rd toe"   Social History   Occupational History    Comment: disabled  Tobacco Use   Smoking status: Never   Smokeless tobacco: Never  Vaping Use   Vaping Use: Never used  Substance and Sexual Activity   Alcohol use: Yes    Comment: social    Drug use: No   Sexual activity: Yes    Partners: Female

## 2022-05-06 DIAGNOSIS — H35033 Hypertensive retinopathy, bilateral: Secondary | ICD-10-CM | POA: Diagnosis not present

## 2022-05-06 LAB — HM DIABETES EYE EXAM

## 2022-05-07 ENCOUNTER — Encounter: Payer: Self-pay | Admitting: Internal Medicine

## 2022-05-08 DIAGNOSIS — G4733 Obstructive sleep apnea (adult) (pediatric): Secondary | ICD-10-CM | POA: Diagnosis not present

## 2022-05-20 ENCOUNTER — Other Ambulatory Visit: Payer: Self-pay | Admitting: Cardiovascular Disease

## 2022-06-08 ENCOUNTER — Ambulatory Visit (INDEPENDENT_AMBULATORY_CARE_PROVIDER_SITE_OTHER): Payer: Medicare Other | Admitting: Podiatry

## 2022-06-08 DIAGNOSIS — T148XXA Other injury of unspecified body region, initial encounter: Secondary | ICD-10-CM

## 2022-06-08 NOTE — Progress Notes (Signed)
  Subjective:  Patient ID: Joshua [name], male    DOB: 08-25-1956,  MRN: [id number]  Chief Complaint  Patient presents with   Diabetic Ulcer       L great toe large blood blister  - Diabetic  - wound not healing    66 y.o. male presents with the above complaint. History confirmed with patient.  He noticed the blister after wearing a certain pair shoes which rubbed on the toe.  Has not had any drainage no redness or swelling.  His A1c is much better is down about 8.2% from 11-12  Objective:  Physical Exam: warm, good capillary refill, no trophic changes or ulcerative lesions, normal DP and PT pulses, and distal tip of left hallux has a well-circumscribed blood blister with hyperkeratosis, no active drainage no cellulitis no purulence or no underlying ulceration.  Assessment:   1. Blood blister      Plan:  Patient was evaluated and treated and all questions answered.  Blood blister appears to be healing well it is consolidated and there are no signs of infection.  No signs of blistering or ulceration deep to this.  I expect this will heal uneventfully advised him to place triple antibiotic ointment on the blood blister area daily until it resolves may take a couple of weeks.  Advised on signs symptoms of worsening or infection and he will let me know if this develops and return to see me as needed.  Return if symptoms worsen or fail to improve.

## 2022-06-21 ENCOUNTER — Encounter: Payer: Self-pay | Admitting: Dietician

## 2022-06-21 ENCOUNTER — Encounter: Payer: Medicare Other | Attending: Family Medicine | Admitting: Dietician

## 2022-06-21 DIAGNOSIS — E1165 Type 2 diabetes mellitus with hyperglycemia: Secondary | ICD-10-CM | POA: Insufficient documentation

## 2022-06-21 DIAGNOSIS — Z713 Dietary counseling and surveillance: Secondary | ICD-10-CM | POA: Diagnosis not present

## 2022-06-21 DIAGNOSIS — E1159 Type 2 diabetes mellitus with other circulatory complications: Secondary | ICD-10-CM | POA: Diagnosis not present

## 2022-06-21 DIAGNOSIS — E785 Hyperlipidemia, unspecified: Secondary | ICD-10-CM | POA: Diagnosis not present

## 2022-06-21 NOTE — Patient Instructions (Addendum)
Consider 2000 units of vitamin D3 daily Take the Humalog prior to each meal Limit/avoid salt. Be sure you stay hydrated. Limit beer to 2 or avoid. Consider 2 Tablespoons ground flax seeds daily (store this in the freezer).  Stir into oatmeal or a low sugar oatmeal.  This can help with constipation Stay active! Add vegetables You tube - Physical therapists Leroy Sea and Mikki Santee or Dr. Denice Paradise or others for safe stretching  Senior Line [phone] (See if you qualify for any services.  They offer free legal services, food assistance, and other things.)

## 2022-06-21 NOTE — Progress Notes (Signed)
Medical Nutrition Therapy  Appointment Start time:  [phone]  Appointment End time:  0845 Patient is here today alone.  He was last seen by this RD on 03/22/2022.  Primary concerns today: follow up  Referral diagnosis: Type 2 Diabetes Preferred learning style: no preference indicated Learning readiness: contemplating   NUTRITION ASSESSMENT  He continues to wear the [place].  His phone is no longer compatible and he is using a reader.  Fasting sensor reading this am was 138.  Usually not over 200 at bedtime.   He states that he is not good about taking his insulin before he eats. He states that he has had several sensor readings less than 70.  Usually this happens when he eats and takes the insulin afterward.  He treats with frozen grapes or other sweets. Rare vegetables. BM now has improved  Oldest son has moved out and youngest son is moving to [place] this week.  His wife cannot work due to physical issues.  Continued financial concerns.  Anthropometrics  302 lbs 06/21/2022 309 lbs 03/22/2022 304 lbs 12/28/2021 324 lbs 09/21/2021 322 lbs 07/26/2021 312 lbs 07/21/2021 302 lbs 06/17/2021 314 lbs 05/25/2021 311 lbs 03/02/2021  "weight fluctuates 10 lbs - fluid retention") 311 lbs 12/22/2020 per patient at another MD 293 lbs 08/26/2020 297 lbs 07/21/2020 300 lbs 06/03/2020 309 lbs 05/15/2020 (increased due to fluid) 306 lbs 05/06/2020 312 lbs 04/10/2020 (not eating much, presumed fluid gain based on examination) 300 lbs 03/06/2020 319 lbs 01/28/20 304 lbs 04/2019 323 lbs about 02/14/20 highest resent weight 195 lbs lowest adult weight 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  Walking improved, some balance issues from neuropathy  Lifestyle & Dietary Hx Patient lives with his wife and three sons.  His son is paying the mortgage and helps out a lot with finances.  His wife has mobility issus and has paperwork to complete disability.  Shopping causes him increased physical pain.  He finds it difficult to meet his own nutritional needs as well as the preferences of his family and overall is not cooking.  Son brings in take out frequently. Rely on food assistance as needed. 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 Middle son has type 2 diabetes and recently diagnosed. -Financially, things are still difficult but his son is helping a lot financially and is getting a food card from [employer]. -Son's are now more supportive re:  healthier food choices    Estimated daily fluid intake: States MD stated to limit to 2 Liters daily.    Supplements: vitamin D Sleep: poor quality "not deep:, only about 5 hours at night.  C-pap is working better. Stress / self-care: financial, disability of wife, difficulty with stress Current average weekly physical activity: none- see above   24-Hr Dietary Recall First Meal: usually skips Snack: none Second Meal: 2 oz boston butt/ribs BBQ Snack: smoothie with strawberries, blueberries, pineapple and pear juice Third Meal: [place] Sausage, cheddar cheese, mayo on hamburger bun Snack: 2 oreos Beverages: water, occasional gatorade, beer, unsweetened tea with splenda, sprite zero  NUTRITION DIAGNOSIS  NB-1.1 Food and nutrition-related knowledge deficit As related to balance of carbohydrate, protein, and fat.  As evidenced by diet hx and patient report.     NUTRITION INTERVENTION  Nutrition education (E-1) on the following topics:     Handouts Provided Include (previous visit) Low sodium nutrition therapy from  [facility] Teaching method utilized: Visual & Auditory  Demonstrated degree of understanding via: Teach Back  Barriers to learning/adherence to lifestyle change: finances, difficulty cooking   Goals Consider 2000 units of vitamin D3 daily Take the Humalog prior to each meal Limit/avoid salt. Be sure you stay hydrated. Limit beer to 2 or avoid. Consider 2 Tablespoons ground flax seeds daily (store this in the freezer).  Stir into oatmeal or a low sugar oatmeal.  This can help with constipation Stay active! Add vegetables You tube - Physical therapists Leroy Sea and Mikki Santee or Dr. Denice Paradise or others for safe stretching  Senior Line [phone] (See if you qualify for any services.  They offer free legal services, food assistance, and other things.)     MONITORING & EVALUATION Dietary intake, weekly physical activity, and label reading in 2-3 months.   Next Steps  Patient is to call as needed.

## 2022-07-04 ENCOUNTER — Other Ambulatory Visit (HOSPITAL_BASED_OUTPATIENT_CLINIC_OR_DEPARTMENT_OTHER): Payer: Self-pay | Admitting: General Practice

## 2022-07-04 ENCOUNTER — Other Ambulatory Visit: Payer: Self-pay | Admitting: Cardiovascular Disease

## 2022-07-19 ENCOUNTER — Ambulatory Visit (INDEPENDENT_AMBULATORY_CARE_PROVIDER_SITE_OTHER): Payer: Medicare Other | Admitting: Neurology

## 2022-07-19 ENCOUNTER — Encounter: Payer: Self-pay | Admitting: Neurology

## 2022-07-19 VITALS — BP 164/100 | HR 96 | Ht 69.0 in | Wt 293.0 lb

## 2022-07-19 DIAGNOSIS — E1142 Type 2 diabetes mellitus with diabetic polyneuropathy: Secondary | ICD-10-CM | POA: Diagnosis not present

## 2022-07-19 MED ORDER — GABAPENTIN 100 MG PO CAPS: 100.0000 mg | ORAL_CAPSULE | Freq: Every day | ORAL | 1 refills | Status: DC

## 2022-07-19 NOTE — Progress Notes (Signed)
Subjective:    Patient ID: Joshua [name] is a 66 y.o. male.  HPI    Interim history:   Joshua [name] is a 66 year old right-handed gentleman with an underlying medical history of hypertension, type 2 diabetes, gout, right foot osteomyelitis with s/p amputation R forefoot in 07/2016, coronary artery disease, chronic kidney disease, and associated anemia and hyper parathyroidism, CHF, OSA on CPAP, and morbid obesity with a BMI of over 109, who presents for follow-up consultation of his neuropathy.  The patient is unaccompanied today.  I last saw him on 12/16/2020.  He had had a hospitalization in October 2021 for kidney failure.   He saw Joshua Denmark, NP, in the interim on 03/18/2021, at which time he reported burning sensation in his fingers and feet.  He also had lower extremity swelling.  He was on gabapentin 100 mg at bedtime, he felt that the gabapentin was helpful.  He was advised to continue with low-dose gabapentin and be cautious with it because of his kidney failure.  Today, 07/19/2022: He reports that he ran out of gabapentin, his primary care refilled it for 90 days.  He feels burning sensation in his fingertips and feet but sensation comes and goes.  Of note, his blood pressure is elevated which is unusual, he reports that he just took his blood pressure medication shortly before the appointment.  He does not have any symptoms from elevated blood pressure values.  He reports that his blood sugar control is better, latest A1c by his report 8.1, down from 14.  Of note, he fell yesterday when it was slick from the rain.  He bumped his left rib cage but has no difficulty breathing.  He has seen orthopedics last year and earlier this year, he had an MRI of the lumbar spine, he was told that he had stenosis.  I reviewed the office visit note with Dr. Lorin Mercy from 05/04/2022.  The importance of better diabetes control and weight loss was discussed.  He had a lumbar spine MRI without contrast on 04/03/2021 and  I reviewed the results:   IMPRESSION: Multilevel degenerative changes as detailed above. Multilevel canal stenosis is primarily due to prominent epidural fat. Facet arthropathy and foraminal narrowing are greatest at lower lumbar levels.   The patient's allergies, current medications, family history, past medical history, past social history, past surgical history and problem list were reviewed and updated as appropriate.   Previously:   I first met him on 06/09/2020 at the request of [name], at which time he reported a several month history of gait and balance problems as well as falls.  Examination was supportive of underlying neuropathy.  He also reported low back pain.  He was advised to proceed with a lumbar spine MRI, labs and EMG testing of the lower extremities.  His insurance denied a lumbar spine MRI.  He was advised to follow-up with his primary care physician regarding low back pain and discuss a referral to a spine specialist.  His labs from 06/09/2020 showed an elevated B12, RPR was negative, TSH normal, heavy metals negative, B6 normal, ANA negative, CK level elevated at 582, had been elevated in the range of 492-686 for the past 9 years. He had an EMG nerve conduction velocity test in the left lower and upper extremities on 07/16/2020 and I reviewed the results:  IMPRESSION:   Nerve conduction studies done on the left upper and left lower extremity shows evidence of a severe primarily axonal peripheral neuropathy, possibly  secondary to diabetes.  EMG of the left lower extremity shows distal acute and chronic denervation consistent with a diagnosis of peripheral neuropathy without clear evidence of an overlying lumbosacral radiculopathy.   He was advised of the test results.     06/09/20: (He) reports problems with his gait and balance and recurrent falls for the past several months.  He denies any burning sensation in his feet but has been told that he likely has neuropathy.  I  reviewed your office note from 06/01/2020. His BUN was 24, creatinine 2.92 at the time. He is followed by endocrinology. He has had uncontrolled diabetes, A1c was over 15 in April 2020, more recently it was 8.9 on 05/15/20. He is followed by endocrinology.  He had physical therapy for about 5 sessions and was given walking poles.  He does not like to use them.  He does not have a walker.  He has chronic low back pain on the left side, without radiation to the leg.  He feels overall weaker in his feet.  He had transmetatarsal amputation of the right foot in 2017.  He had adapted to walking after surgery fairly well but in the past few months he has had balance issues and gait insecurity, recurrent falls.  He fell and hurt his right knee.  He has had right shoulder pain and has received injections with cortisone twice into the right shoulder.  He has not seen orthopedics for his right knee.  He reports swelling and pain in the right knee.  He tries to hydrate well with water, drinks diet tea, several servings per day and drinks alcohol several servings per week in the form of beer, rare liquor.  He is a non-smoker.  He denies any stinging pain but has dull achy pain in the feet at night, not every night.  He does not notice any actual numbness.  He has a strong family history of diabetes and had an uncle with neuropathy as he recalls.   His Past Medical History Is Significant For: Past Medical History:  Diagnosis Date   Acute coronary syndrome (HCC)    Anemia    B12 deficiency    Chest pain    CHF (congestive heart failure) (HCC)    Chronic combined systolic and diastolic CHF (congestive heart failure) ([place])    a. 07/2015 Echo: EF 45-50%, mod LVH, mid apical/antsept AK, Gr 1 DD.   Chronic low back pain    CKD (chronic kidney disease), stage III (HCC)    Constipation    COPD (chronic obstructive pulmonary disease) (HCC)    Coronary artery disease    a. 2012 s/p MI/PCI: s/p DES to mid/dist RCA and  overlapping DES to LAD;  b. 07/2015 Cath: LAD 10% ISR, D1 40(jailed), LCX 1m OM2 30, OM3 30, RCA 551mSR, 10d ISR; c. 07/2016 NSTEMI/PCI: LAD 85ost/p/m (3.5x20 Synergy DES overlapping prior stent), D1 40, LCX 7583mM2 95 (2.75x16 Synergy DES), OM3 30, RCA 47m20md.   Depression    Diabetic gastroparesis (HCC)[place] Diabetic polyneuropathy (HCC)[place] DKA (diabetic ketoacidoses) 03/14/2018   GERD (gastroesophageal reflux disease)    Gout    Heart failure (HCC)    Heart murmur    History of MI (myocardial infarction)    Hyperlipemia    Hypertension    Hypertensive heart disease    Hypokalemia    Ischemic cardiomyopathy    a. 07/2015 Echo: EF 45-50%, mod LVH, mid-apicalanteroseptal AK, Gr1 DD.  Joint pain    Kidney problem    Morbid obesity ([place])    OSA on CPAP    Osteoarthritis    Osteomyelitis ([place])    a. 07/2016 MTP joint of R great toe s/p transmetatarsal amputation.   Other fatigue    Polyneuropathy in diabetes(357.2)    Pulmonary sarcoidosis ([place])    "problems w/it years ago; no problems anymore" (07/03/2014)   Rectal bleeding    Shortness of breath    Shortness of breath on exertion    Sleep apnea    Stomach ulcer    Swallowing difficulty    Type II diabetes mellitus ([place])     His Past Surgical History Is Significant For: Past Surgical History:  Procedure Laterality Date   AMPUTATION Right 07/24/2016   Procedure: TRANSMETATARSAL FOOT AMPUTATION;  Surgeon: [name], MD;  Location: [place];  Service: Orthopedics;  Laterality: Right;   BALLOON DILATION N/A 03/21/2018   Procedure: BALLOON DILATION;  Surgeon: [name], MD;  Location: [facility] ENDOSCOPY;  Service: Endoscopy;  Laterality: N/A;   BIOPSY  03/16/2022   Procedure: BIOPSY;  Surgeon: [name], MD;  Location: [facility] ENDOSCOPY;  Service: Endoscopy;;   BREAST LUMPECTOMY Right ~ 2000   "benign"   CARDIAC CATHETERIZATION N/A 07/30/2015   Procedure: Left Heart Cath and Coronary Angiography;  Surgeon: Burnell Blanks, MD;  Location: [place] CV LAB;  Service: Cardiovascular;  Laterality: N/A;   CARDIAC CATHETERIZATION N/A 07/19/2016   Procedure: Left Heart Cath and Coronary Angiography;  Surgeon: Belva Crome, MD;  Location: [place] CV LAB;  Service: Cardiovascular;  Laterality: N/A;   CARDIAC CATHETERIZATION N/A 07/29/2016   Procedure: Coronary Stent Intervention;  Surgeon: Belva Crome, MD;  Location: [place] CV LAB;  Service: Cardiovascular;  Laterality: N/A;   CARDIAC CATHETERIZATION N/A 07/29/2016   Procedure: Intravascular Ultrasound/IVUS;  Surgeon: Belva Crome, MD;  Location: [place] CV LAB;  Service: Cardiovascular;  Laterality: N/A;   CATARACT EXTRACTION W/ INTRAOCULAR LENS  IMPLANT, BILATERAL Bilateral 2014-2015   COLONOSCOPY WITH PROPOFOL N/A 08/22/2017   Procedure: COLONOSCOPY WITH PROPOFOL;  Surgeon: [name], MD;  Location: [place];  Service: Endoscopy;  Laterality: N/A;   COLONOSCOPY WITH PROPOFOL N/A 03/16/2022   Procedure: COLONOSCOPY WITH PROPOFOL;  Surgeon: [name], MD;  Location: [facility] ENDOSCOPY;  Service: Endoscopy;  Laterality: N/A;   CORONARY ANGIOPLASTY WITH STENT PLACEMENT  10/31/2011   30 % MID ATRIOVENTRICULAR GROOVE CX STENOSIS. 90 % MID RCA.PTA TO LAD/STENTING OF TANDEM 60-70%. LAD STENOSIS 99% REDUCED TO 0%.3.0 X 32MM PROMUS DES POSTDILATED TO 3.25MM.   CORONARY ANGIOPLASTY WITH STENT PLACEMENT  07/03/2014   "2"   CORONARY STENT INTERVENTION N/A 04/09/2019   Procedure: CORONARY STENT INTERVENTION;  Surgeon: Jettie Booze, MD;  Location: [place] [facility] LAB;  Service: Cardiovascular;  Laterality: N/A;   CORONARY STENT INTERVENTION N/A 08/22/2019   Procedure: CORONARY STENT INTERVENTION;  Surgeon: Martinique, Peter M, MD;  Location: [place] CV LAB;  Service: Cardiovascular;  Laterality: N/A;   ESOPHAGOGASTRODUODENOSCOPY (EGD) WITH PROPOFOL N/A 03/21/2018   Procedure: ESOPHAGOGASTRODUODENOSCOPY (EGD) WITH PROPOFOL;  Surgeon: [name], MD;  Location: [place];  Service: Endoscopy;  Laterality: N/A;   ESOPHAGOGASTRODUODENOSCOPY (EGD) WITH PROPOFOL N/A 03/16/2022   Procedure: ESOPHAGOGASTRODUODENOSCOPY (EGD) WITH PROPOFOL;  Surgeon: [name], MD;  Location: [facility] ENDOSCOPY;  Service: Endoscopy;  Laterality: N/A;   I & D EXTREMITY Right 10/30/2013   Procedure: IRRIGATION AND DEBRIDEMENT RIGHT SMALL FINGER POSSIBLE SECONDARY  WOUND CLOSURE;  Surgeon: [name], MD;  Location: [facility];  Service: Orthopedics;  Laterality: Right;  [name] is available after 4pm   I & D EXTREMITY Right 11/01/2013   Procedure:  IRRIGATION AND DEBRIDEMENT RIGHT  PROXIMAL PHALANGEAL LEVEL AMPUTATION;  Surgeon: [name], MD;  Location: [facility];  Service: Orthopedics;  Laterality: Right;   INCISION AND DRAINAGE Right 10/28/2013   Procedure: INCISION AND DRAINAGE Right small finger;  Surgeon: [name], MD;  Location: [facility];  Service: Orthopedics;  Laterality: Right;   LEFT HEART CATH Left 11/03/2011   Procedure: LEFT HEART CATH;  Surgeon: [name], MD;  Location: [facility] CATH LAB;  Service: Cardiovascular;  Laterality: Left;   LEFT HEART CATH AND CORONARY ANGIOGRAPHY N/A 04/09/2019   Procedure: LEFT HEART CATH AND CORONARY ANGIOGRAPHY;  Surgeon: Jettie Booze, MD;  Location: [place] CV LAB;  Service: Cardiovascular;  Laterality: N/A;   LEFT HEART CATHETERIZATION WITH CORONARY ANGIOGRAM N/A 07/03/2014   Procedure: LEFT HEART CATHETERIZATION WITH CORONARY ANGIOGRAM;  Surgeon: [name], MD;  Location: [facility] CATH LAB;  Service: Cardiovascular;  Laterality: N/A;   PERCUTANEOUS CORONARY STENT INTERVENTION (PCI-S) Left 11/03/2011   Procedure: PERCUTANEOUS CORONARY STENT INTERVENTION (PCI-S);  Surgeon: [name], MD;  Location: [facility] CATH LAB;  Service: Cardiovascular;  Laterality: Left;   RIGHT/LEFT HEART CATH AND CORONARY ANGIOGRAPHY N/A 08/22/2019   Procedure: RIGHT/LEFT HEART CATH AND CORONARY  ANGIOGRAPHY;  Surgeon: Martinique, Peter M, MD;  Location: [place] CV LAB;  Service: Cardiovascular;  Laterality: N/A;   TOE AMPUTATION Right ~ 2010   "2nd toe"   TOE AMPUTATION Right 2012   "3rd toe"    His Family History Is Significant For: Family History  Problem Relation Age of Onset   Sudden death Mother    Diabetes Maternal Grandmother    Hypertension Maternal Grandmother    Heart attack Maternal Aunt    Sudden death Other     His Social History Is Significant For: Social History   Socioeconomic History   Marital status: Married    Spouse name: Dorian Pod   Number of children: 3   Years of education: Not on file   Highest education level: Not on file  Occupational History    Comment: disabled  Tobacco Use   Smoking status: Never   Smokeless tobacco: Never  Vaping Use   Vaping Use: Never used  Substance and Sexual Activity   Alcohol use: Yes    Alcohol/week: 24.0 standard drinks of alcohol    Types: 24 Cans of beer per week    Comment: social    Drug use: No   Sexual activity: Yes    Partners: Female  Other Topics Concern   Not on file  Social History Narrative   Not on file   Social Determinants of Health   Financial Resource Strain: Not on file  Food Insecurity: Not on file  Transportation Needs: Not on file  Physical Activity: Not on file  Stress: Not on file  Social Connections: Not on file    His Allergies Are:  Allergies  Allergen Reactions   Chlorthalidone Other (See Comments)    Causes dehydration, kidneys shut down  :   His Current Medications Are:  Outpatient Encounter Medications as of 07/19/2022  Medication Sig   Accu-Chek FastClix Lancets MISC 1 each by Other route in the morning, at noon, in the evening, and at bedtime.    ACCU-CHEK GUIDE test strip USE  UP TO FOUR TIMES DAILY AS DIRECTED (Patient taking differently: 1 each by Other route See admin instructions. USE UP TO FOUR TIMES DAILY AS DIRECTED)   acetaminophen (TYLENOL) 650 MG CR  tablet Take 1,300 mg by mouth daily as needed for pain.   allopurinol (ZYLOPRIM) 300 MG tablet Take 300 mg by mouth daily.    aspirin EC 81 MG tablet Take 81 mg by mouth daily.    blood glucose meter kit and supplies Dispense based on patient and insurance preference. Use up to four times daily as directed. (FOR ICD-10 E10.9, E11.9). (Patient taking differently: 1 each by Other route See admin instructions. Dispense based on patient and insurance preference. Use up to four times daily as directed. (FOR ICD-10 E10.9, E11.9).)   cyclobenzaprine (FLEXERIL) 10 MG tablet Take 10 mg by mouth at bedtime as needed for muscle spasms.   dapagliflozin propanediol (FARXIGA) 10 MG TABS tablet Take 10 mg by mouth daily.   famotidine (PEPCID) 20 MG tablet Take 20 mg by mouth at bedtime.   furosemide (LASIX) 80 MG tablet TAKE 2 TABLETS (160 MG) BY MOUTH TWICE DAILY FOR 1 WEEK, THEN GO BACK TO TAKE 1 TABLET BY MOUTH TWICE DAILY   insulin glargine (LANTUS SOLOSTAR) 100 UNIT/ML Solostar Pen Inject 30 Units into the skin 2 (two) times daily.   insulin lispro (HUMALOG KWIKPEN) 200 UNIT/ML KwikPen Inject 10-18 Units into the skin 3 (three) times daily before meals.   isosorbide mononitrate (IMDUR) 60 MG 24 hr tablet Take 3 tablets (180 mg total) by mouth daily.   latanoprost (XALATAN) 0.005 % ophthalmic solution Place 1 drop into both eyes at bedtime.   Lidocaine HCl-Benzyl Alcohol (SALONPAS LIDOCAINE PLUS) 4-10 % CREA Apply 1 patch topically as needed (pain).   metoprolol succinate (TOPROL-XL) 25 MG 24 hr tablet Take 3 tablets by mouth once daily   nitroGLYCERIN (NITROSTAT) 0.4 MG SL tablet Place 1 tablet (0.4 mg total) under the tongue every 5 (five) minutes as needed for chest pain.   pantoprazole (PROTONIX) 40 MG tablet TAKE 1 TABLET BY MOUTH TWICE DAILY BEFORE MEAL(S)   rosuvastatin (CRESTOR) 20 MG tablet Take 20 mg by mouth daily.   Semaglutide, 1 MG/DOSE, (OZEMPIC, 1 MG/DOSE,) 4 MG/3ML SOPN Inject 1 mg into the  skin once a week. (Patient taking differently: Inject 1 mg into the skin every Monday.)   ticagrelor (BRILINTA) 90 MG TABS tablet Take 1 tablet by mouth twice daily   tretinoin (RETIN-A) 0.1 % cream Apply 1 application. topically daily as needed (skin irritation).   Wheat Dextrin (BENEFIBER) CHEW Chew 1 tablet by mouth daily.   No facility-administered encounter medications on file as of 07/19/2022.  :  Review of Systems:  Out of a complete 14 point review of systems, all are reviewed and negative with the exception of these symptoms as listed below:  Review of Systems  Neurological:        Pt here for joint pain and balance Pt states he has increased burning in right hand ring finger. Pt states some burning in left hand ring finger . Pt fell yesterday. Pt states he feels he has drop foot Pt states  Pt states no bruise under left arm pt states just pain under left arm       Objective:  Neurological Exam  Physical Exam Physical Examination:   Vitals:   07/19/22 1034  BP: (!) 164/100  Pulse: 96    General Examination: The patient is a very pleasant 65  y.o. male in no acute distress. He appears well-developed and well-nourished and well groomed.   HEENT: Normocephalic, atraumatic, pupils are equal, round and reactive to light, extraocular tracking is preserved, hearing is grossly intact, face is symmetric with normal facial animation.  He has no dysarthria, airway examination reveals moderately crowded airway to significant crowding.  Tongue protrudes centrally and palate elevates symmetrically.  No dysarthria or hypophonia.  Moderate mouth dryness noted.  No carotid bruits.   Chest: Clear to auscultation without wheezing, rhonchi or crackles noted.   Heart: S1+S2+0, regular and normal without murmurs, rubs or gallops noted.    Abdomen: Soft, non-tender and non-distended.   Extremities: There is trace to 1+ edema in both lower extremities with hyperpigmented scars.  Area of skin  lesion left shin with bandage.    Skin: Warm and dry with multiple hyperpigmented spots and scratch marks throughout his visible skin and on the abdomen and chest.    Musculoskeletal: exam reveals low back pain.  He is status post right forefoot amputation.     Neurologically:  Mental status: The patient is awake, alert and oriented in all 4 spheres. His immediate and remote memory, attention, language skills and fund of knowledge are appropriate. There is no evidence of aphasia, agnosia, apraxia or anomia. Speech is clear with normal prosody and enunciation. Thought process is linear. Mood is normal and affect is normal.  Cranial nerves II - XII are as described above under HEENT exam.  Motor exam: Normal bulk, strength and tone is noted, with the exception of foot dorsiflexion weakness bilaterally. There is no drift, tremor or rebound. Romberg is not tested due to safety concerns.  Reflexes are trace in the upper extremities and absent in the lower extremities. Fine motor skills and coordination: intact grossly in the upper and lower extremities.   Cerebellar testing: No dysmetria or intention tremor.  Sensory exam: intact to light touch.  Decreased temperature sense in the distal upper and lower extremities.   Gait, station and balance: He stands with difficulty.  He stands slightly wide-based, posture is age-appropriate.  He walks with a limp and walks slightly wide-based.  He did not bring any cane. Tandem walk is not tested due to safety concerns.   Assessment and Plan:  In summary, MEDARDO HASSING is a 66 year old male with an underlying medical history of hypertension, type 2 diabetes, gout, right foot osteomyelitis with s/p amputation R forefoot in 07/2016, coronary artery disease, chronic kidney disease, and associated anemia and hyper parathyroidism, CHF, OSA on CPAP, and morbid obesity with a BMI of over 67, who presents for follow-up consultation of his peripheral neuropathy.  He has  intermittent discomfort including burning sensation in the fingers and feet. EMG  and NCV testing on 07/16/2020 was supportive with a significant neuropathy.  He has seen orthopedics for his low back pain, he had an MRI of the lumbar spine in 2022.  He is working on weight loss and better diabetes control.  He had symptomatic relief with low-dose gabapentin at bedtime.  I will renew his prescription for gabapentin 100 mg nightly.  He is advised to follow-up with the nurse petitioner in 6 months, sooner if needed.  We talked about the importance of fall prevention.  He had a recent fall and reports soreness around the left rib cage and he is advised to seek follow-up with his primary care or go to urgent care if he does not improve, he currently does not have any  difficulty breathing and no severe pain.  He is advised to use a cane for gait safety.  I answered all his questions today and he was in agreement.  I spent 30 minutes in total face-to-face time and in reviewing records during pre-charting, more than 50% of which was spent in counseling and coordination of care, reviewing test results, reviewing medications and treatment regimen and/or in discussing or reviewing the diagnosis of neuropathy, the prognosis and treatment options. Pertinent laboratory and imaging test results that were available during this visit with the patient were reviewed by me and considered in my medical decision making (see chart for details).

## 2022-07-21 ENCOUNTER — Encounter (INDEPENDENT_AMBULATORY_CARE_PROVIDER_SITE_OTHER): Payer: Self-pay

## 2022-07-22 ENCOUNTER — Other Ambulatory Visit: Payer: Self-pay | Admitting: Gastroenterology

## 2022-07-22 DIAGNOSIS — R6881 Early satiety: Secondary | ICD-10-CM | POA: Diagnosis not present

## 2022-07-22 DIAGNOSIS — K5900 Constipation, unspecified: Secondary | ICD-10-CM | POA: Diagnosis not present

## 2022-07-22 DIAGNOSIS — Z86010 Personal history of colonic polyps: Secondary | ICD-10-CM | POA: Diagnosis not present

## 2022-08-09 ENCOUNTER — Other Ambulatory Visit: Payer: Self-pay | Admitting: Cardiovascular Disease

## 2022-08-26 ENCOUNTER — Encounter: Payer: Self-pay | Admitting: Internal Medicine

## 2022-08-26 ENCOUNTER — Encounter: Payer: Medicare Other | Attending: Family Medicine | Admitting: Dietician

## 2022-08-26 ENCOUNTER — Encounter: Payer: Self-pay | Admitting: Dietician

## 2022-08-26 ENCOUNTER — Ambulatory Visit (INDEPENDENT_AMBULATORY_CARE_PROVIDER_SITE_OTHER): Payer: Medicare Other | Admitting: Internal Medicine

## 2022-08-26 VITALS — BP 134/88 | HR 97 | Ht 69.0 in | Wt 294.0 lb

## 2022-08-26 DIAGNOSIS — E1142 Type 2 diabetes mellitus with diabetic polyneuropathy: Secondary | ICD-10-CM | POA: Diagnosis not present

## 2022-08-26 DIAGNOSIS — E1159 Type 2 diabetes mellitus with other circulatory complications: Secondary | ICD-10-CM | POA: Diagnosis not present

## 2022-08-26 DIAGNOSIS — E1165 Type 2 diabetes mellitus with hyperglycemia: Secondary | ICD-10-CM

## 2022-08-26 DIAGNOSIS — E785 Hyperlipidemia, unspecified: Secondary | ICD-10-CM

## 2022-08-26 LAB — POCT GLYCOSYLATED HEMOGLOBIN (HGB A1C): Hemoglobin A1C: 7.6 % — AB (ref 4.0–5.6)

## 2022-08-26 MED ORDER — LANTUS SOLOSTAR 100 UNIT/ML ~~LOC~~ SOPN: 24.0000 [IU] | PEN_INJECTOR | Freq: Two times a day (BID) | SUBCUTANEOUS | 3 refills | Status: DC

## 2022-08-26 MED ORDER — HUMALOG KWIKPEN 200 UNIT/ML ~~LOC~~ SOPN: 10.0000 [IU] | PEN_INJECTOR | Freq: Three times a day (TID) | SUBCUTANEOUS | 3 refills | Status: DC

## 2022-08-26 MED ORDER — OZEMPIC (1 MG/DOSE) 4 MG/3ML ~~LOC~~ SOPN: 1.0000 mg | PEN_INJECTOR | SUBCUTANEOUS | 3 refills | Status: DC
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
## 2022-08-26 NOTE — Patient Instructions (Addendum)
Continue to take your medications Continue to look at your [place] readings to see the effects of food and activity on your blood sugar.  Simple meals  Avoid added salt  Choose foods that are lower sodium    Baked rather than fried most often  Add a bag of frozen vegetables to a lean protein  [facility] Mobile Crisis Management Crisis Line:  [phone]  Suicide Prevention Hotline [facility]) 5645259775

## 2022-08-26 NOTE — Patient Instructions (Addendum)
Please change: - Lantus 24 units a day 2x a day - Humalog 10-15 units 15 minutes before meals  Continue: - Ozempic 1 mg weekly  Please come back for a follow-up appointment in 4 months.

## 2022-08-26 NOTE — Progress Notes (Signed)
Patient ID: Joshua [name], male   DOB: Apr 22, 1956, 66 y.o.   MRN: [id number]   HPI: Joshua [name] is a 66 y.o.-year-old male, initially referred by his PCP, Dr. Alyson Ingles, returning for follow-up for DM2, dx in 1990s, insulin-dependent, uncontrolled, with complications (CAD - s/p AMI 2012, s/p PCIs, ischemic cardiomyopathy, CHF, CKD stage 4, PN - s/p R transmetatarsal amputation, gastroparesis, DR, history of DKA). He saw Dr Dwyane Dee initially, then Dr. Chalmers Cater for 20 years, but she was not accepting Medicaid patients anymore so he had to switch providers.  Last visit with me 4 months ago. He now has [employer].  Interim history: No increased urination, blurry vision, nausea,  chest pain. He has back pain.  This is chronic for him. He has a 64 oz fluid restriction 2/2 leg swelling.  Reviewed HbA1c levels:  Lab Results  Component Value Date   HGBA1C 8.1 (A) 04/22/2022   HGBA1C 12.9 (A) 01/21/2022   HGBA1C 11.2 (A) 10/28/2021   HGBA1C 9.2 (H) 08/11/2021   HGBA1C 9.1 (A) 06/17/2021   HGBA1C 8.3 (A) 02/10/2021   HGBA1C 7.5 (A) 08/15/2020   HGBA1C 8.9 (A) 05/15/2020   HGBA1C 13.8 (A) 01/28/2020   HGBA1C 10.2 (H) 08/16/2019   He was on: - Lantus 20 units 2x a day - NovoLog 10 units 3x a day, before meals Prev. On 70/30 until Spring 2020.  Currently on: - Farxiga 10 mg before b'fast - Basaglar 30 >> 20-25 >> 30 units a day 2x a day >> Lantus 30 units 2x a day - Humalog 10-15 >> 10-20 >> 10-12 >> 18 >> 15-20 units before meals  - Ozempic 1 mg weekly (started to take it correctly only after 12/29/2021 after he saw the diabetes educator) Previously on Lantus, NovoLog, Victoza.  He is now checking blood sugars >4x a day with his his freestyle libre CGM:     Previously:     Lowest sugar was 99 >> 60 >> 60 (correction of a high) >> 57 (took insulin late after eating, alcohol); he has hypoglycemia awareness at 100.  Highest sugar was 538 >> ... 251 >> 300 x2.    Glucometer: ReliOn  Pt's meals are: - Breakfast: skips - Lunch: may skip, PB or chicken - Dinner: chicken w/o sides or with + veggies + starch;  - Snacks: occasional sweets: Hershey bar, sweet tea, cool aid  -+ CKD stage IV- Dr [name], last BUN/creatinine:  Lab Results  Component Value Date   BUN 33 (H) 10/26/2021   BUN 39 (H) 08/11/2021   CREATININE 2.55 (H) 10/26/2021   CREATININE 2.44 (H) 08/11/2021  10/18/2019: 24/2.92 10/18/2019: ACR 765.3  Lab Results  Component Value Date   GFRAA 27 (L) 08/23/2019   GFRAA 25 (L) 08/22/2019   GFRAA 24 (L) 08/21/2019   GFRAA 22 (L) 08/20/2019   GFRAA 29 (L) 08/19/2019  Not on ACE inhibitor/ARB  -+ HL; last set of lipids: Lab Results  Component Value Date   CHOL 283 (H) 08/11/2021   HDL 89 08/11/2021   LDLCALC 169 (H) 08/11/2021   TRIG 145 08/11/2021   CHOLHDL 2.0 08/17/2019  09/05/2020: 112/137/38/49 10/18/2019: 143/105/53/69 On Crestor 40 - restarted 07/2021.  Previously on Zetia, also.  - last eye exam was in 12/2021: reportedly + DR, + possible glaucoma. Had cataract sx in 2014, 2015.  -+ numbness in his feet.  He also has significant back pain.  He sees neurology for peripheral neuropathy, leg pain, instability.  Now on  gabapentin.  Last foot exam 01/2022.  On ASA 81.  Latest TSH:  Lab Results  Component Value Date   TSH 2.890 08/11/2021   Pt has FH of DM in [place].  He also has a history of HTN, OSA, GERD, esophageal ulcers. No h/o pancreatitis or FH of MTC.  He was in a play in [place].  ROS: See HPI  I reviewed pt's medications, allergies, PMH, social hx, family hx, and changes were documented in the history of present illness. Otherwise, unchanged from my initial visit note.  Past Medical History:  Diagnosis Date   Acute coronary syndrome (HCC)    Anemia    B12 deficiency    Chest pain    CHF (congestive heart failure) (HCC)    Chronic combined systolic and diastolic CHF (congestive heart failure)  ([place])    a. 07/2015 Echo: EF 45-50%, mod LVH, mid apical/antsept AK, Gr 1 DD.   Chronic low back pain    CKD (chronic kidney disease), stage III (HCC)    Constipation    COPD (chronic obstructive pulmonary disease) (HCC)    Coronary artery disease    a. 2012 s/p MI/PCI: s/p DES to mid/dist RCA and overlapping DES to LAD;  b. 07/2015 Cath: LAD 10% ISR, D1 40(jailed), LCX 78m OM2 30, OM3 30, RCA 545mSR, 10d ISR; c. 07/2016 NSTEMI/PCI: LAD 85ost/p/m (3.5x20 Synergy DES overlapping prior stent), D1 40, LCX 7571mM2 95 (2.75x16 Synergy DES), OM3 30, RCA 51m54md.   Depression    Diabetic gastroparesis (HCC)[place] Diabetic polyneuropathy (HCC)[place] DKA (diabetic ketoacidoses) 03/14/2018   GERD (gastroesophageal reflux disease)    Gout    Heart failure (HCC)    Heart murmur    History of MI (myocardial infarction)    Hyperlipemia    Hypertension    Hypertensive heart disease    Hypokalemia    Ischemic cardiomyopathy    a. 07/2015 Echo: EF 45-50%, mod LVH, mid-apicalanteroseptal AK, Gr1 DD.   Joint pain    Kidney problem    Morbid obesity (HCC)[place] OSA on CPAP    Osteoarthritis    Osteomyelitis (HCC)[place] a. 07/2016 MTP joint of R great toe s/p transmetatarsal amputation.   Other fatigue    Polyneuropathy in diabetes(357.2)    Pulmonary sarcoidosis (HCC)[place] "problems w/it years ago; no problems anymore" (07/03/2014)   Rectal bleeding    Shortness of breath    Shortness of breath on exertion    Sleep apnea    Stomach ulcer    Swallowing difficulty    Type II diabetes mellitus (HCC)[place] Past Surgical History:  Procedure Laterality Date   AMPUTATION Right 07/24/2016   Procedure: TRANSMETATARSAL FOOT AMPUTATION;  Surgeon: MarcNewt Minion;  [facility];  Laterality: Right;   BALLOON DILATION N/A 03/21/2018   Procedure: BALLOON DILATION;  Surgeon: BuccRonald Lobo;  [facility];  Service: Endoscopy;  Laterality: N/A;   BIOPSY  03/16/2022   Procedure:  BIOPSY;  Surgeon: SchoWilford Corner;  [facility];  Service: Endoscopy;;   BREAST LUMPECTOMY Right ~ 2000   "benign"   CARDIAC CATHETERIZATION N/A 07/30/2015   Procedure: Left Heart Cath and Coronary Angiography;  Surgeon: ChriBurnell Blanks;  [facility];  Service: Cardiovascular;  Laterality: N/A;   CARDIAC CATHETERIZATION N/A 07/19/2016   Procedure: Left Heart Cath and Coronary Angiography;  Surgeon: Belva Crome, MD;  Location: [place] CV LAB;  Service: Cardiovascular;  Laterality: N/A;   CARDIAC CATHETERIZATION N/A 07/29/2016   Procedure: Coronary Stent Intervention;  Surgeon: Belva Crome, MD;  Location: [place] [facility] LAB;  Service: Cardiovascular;  Laterality: N/A;   CARDIAC CATHETERIZATION N/A 07/29/2016   Procedure: Intravascular Ultrasound/IVUS;  Surgeon: Belva Crome, MD;  Location: [place] CV LAB;  Service: Cardiovascular;  Laterality: N/A;   CATARACT EXTRACTION W/ INTRAOCULAR LENS  IMPLANT, BILATERAL Bilateral 2014-2015   COLONOSCOPY WITH PROPOFOL N/A 08/22/2017   Procedure: COLONOSCOPY WITH PROPOFOL;  Surgeon: [name], MD;  Location: [place];  Service: Endoscopy;  Laterality: N/A;   COLONOSCOPY WITH PROPOFOL N/A 03/16/2022   Procedure: COLONOSCOPY WITH PROPOFOL;  Surgeon: [name], MD;  [facility];  Service: Endoscopy;  Laterality: N/A;   CORONARY ANGIOPLASTY WITH STENT PLACEMENT  10/31/2011   30 % MID ATRIOVENTRICULAR GROOVE CX STENOSIS. 90 % MID RCA.PTA TO LAD/STENTING OF TANDEM 60-70%. LAD STENOSIS 99% REDUCED TO 0%.3.0 X 32MM PROMUS DES POSTDILATED TO 3.25MM.   CORONARY ANGIOPLASTY WITH STENT PLACEMENT  07/03/2014   "2"   CORONARY STENT INTERVENTION N/A 04/09/2019   Procedure: CORONARY STENT INTERVENTION;  Surgeon: Jettie Booze, MD;  Location: [place] CV LAB;  Service: Cardiovascular;  Laterality: N/A;   CORONARY STENT INTERVENTION N/A 08/22/2019   Procedure: CORONARY STENT INTERVENTION;   Surgeon: Martinique, Peter M, MD;  Location: [place] CV LAB;  Service: Cardiovascular;  Laterality: N/A;   ESOPHAGOGASTRODUODENOSCOPY (EGD) WITH PROPOFOL N/A 03/21/2018   Procedure: ESOPHAGOGASTRODUODENOSCOPY (EGD) WITH PROPOFOL;  Surgeon: [name], MD;  Location: [place];  Service: Endoscopy;  Laterality: N/A;   ESOPHAGOGASTRODUODENOSCOPY (EGD) WITH PROPOFOL N/A 03/16/2022   Procedure: ESOPHAGOGASTRODUODENOSCOPY (EGD) WITH PROPOFOL;  Surgeon: [name], MD;  [facility];  Service: Endoscopy;  Laterality: N/A;   I & D EXTREMITY Right 10/30/2013   Procedure: IRRIGATION AND DEBRIDEMENT RIGHT SMALL FINGER POSSIBLE SECONDARY WOUND CLOSURE;  Surgeon: [name], MD;  [facility];  Service: Orthopedics;  Laterality: Right;  [name] is available after 4pm   I & D EXTREMITY Right 11/01/2013   Procedure:  IRRIGATION AND DEBRIDEMENT RIGHT  PROXIMAL PHALANGEAL LEVEL AMPUTATION;  Surgeon: [name], MD;  [facility];  Service: Orthopedics;  Laterality: Right;   INCISION AND DRAINAGE Right 10/28/2013   Procedure: INCISION AND DRAINAGE Right small finger;  Surgeon: [name], MD;  [facility];  Service: Orthopedics;  Laterality: Right;   LEFT HEART CATH Left 11/03/2011   Procedure: LEFT HEART CATH;  Surgeon: [name], MD;  Location: [facility] CATH LAB;  Service: Cardiovascular;  Laterality: Left;   LEFT HEART CATH AND CORONARY ANGIOGRAPHY N/A 04/09/2019   Procedure: LEFT HEART CATH AND CORONARY ANGIOGRAPHY;  Surgeon: Jettie Booze, MD;  Location: [place] CV LAB;  Service: Cardiovascular;  Laterality: N/A;   LEFT HEART CATHETERIZATION WITH CORONARY ANGIOGRAM N/A 07/03/2014   Procedure: LEFT HEART CATHETERIZATION WITH CORONARY ANGIOGRAM;  Surgeon: [name], MD;  [facility];  Service: Cardiovascular;  Laterality: N/A;   PERCUTANEOUS CORONARY STENT INTERVENTION (PCI-S) Left 11/03/2011   Procedure: PERCUTANEOUS CORONARY  STENT INTERVENTION (PCI-S);  Surgeon: [name], MD;  Location: [facility] CATH LAB;  Service: Cardiovascular;  Laterality: Left;   RIGHT/LEFT HEART CATH AND CORONARY ANGIOGRAPHY N/A 08/22/2019   Procedure: RIGHT/LEFT HEART CATH AND CORONARY ANGIOGRAPHY;  Surgeon: Martinique, Peter M, MD;  Location: [place] CV  LAB;  Service: Cardiovascular;  Laterality: N/A;   TOE AMPUTATION Right ~ 2010   "2nd toe"   TOE AMPUTATION Right 2012   "3rd toe"   Social History   Socioeconomic History   Marital status: Married    Spouse name: Dorian Pod   Number of children: 3   Years of education: Not on file   Highest education level: Not on file  Occupational History    Comment: disabled  Tobacco Use   Smoking status: Never   Smokeless tobacco: Never  Vaping Use   Vaping Use: Never used  Substance and Sexual Activity   Alcohol use: Yes    Alcohol/week: 24.0 standard drinks of alcohol    Types: 24 Cans of beer per week    Comment: social    Drug use: No   Sexual activity: Yes    Partners: Female  Other Topics Concern   Not on file  Social History Narrative   Not on file   Social Determinants of Health   Financial Resource Strain: Not on file  Food Insecurity: Not on file  Transportation Needs: Not on file  Physical Activity: Not on file  Stress: Not on file  Social Connections: Not on file  Intimate Partner Violence: Not on file   Current Outpatient Medications on File Prior to Visit  Medication Sig Dispense Refill   Accu-Chek FastClix Lancets MISC 1 each by Other route in the morning, at noon, in the evening, and at bedtime.      ACCU-CHEK GUIDE test strip USE UP TO FOUR TIMES DAILY AS DIRECTED (Patient taking differently: 1 each by Other route See admin instructions. USE UP TO FOUR TIMES DAILY AS DIRECTED) 300 each 3   acetaminophen (TYLENOL) 650 MG CR tablet Take 1,300 mg by mouth daily as needed for pain.     allopurinol (ZYLOPRIM) 300 MG tablet Take 300 mg by mouth daily.      aspirin EC 81  MG tablet Take 81 mg by mouth daily.      blood glucose meter kit and supplies Dispense based on patient and insurance preference. Use up to four times daily as directed. (FOR ICD-10 E10.9, E11.9). (Patient taking differently: 1 each by Other route See admin instructions. Dispense based on patient and insurance preference. Use up to four times daily as directed. (FOR ICD-10 E10.9, E11.9).) 1 each 0   cyclobenzaprine (FLEXERIL) 10 MG tablet Take 10 mg by mouth at bedtime as needed for muscle spasms.     dapagliflozin propanediol (FARXIGA) 10 MG TABS tablet Take 10 mg by mouth daily.     famotidine (PEPCID) 20 MG tablet Take 20 mg by mouth at bedtime.     furosemide (LASIX) 80 MG tablet TAKE 2 TABLETS (160 MG) BY MOUTH TWICE DAILY FOR 1 WEEK, THEN GO BACK TO TAKE 1 TABLET BY MOUTH TWICE DAILY 180 tablet 0   gabapentin (NEURONTIN) 100 MG capsule Take 1 capsule (100 mg total) by mouth at bedtime. 90 capsule 1   insulin glargine (LANTUS SOLOSTAR) 100 UNIT/ML Solostar Pen Inject 30 Units into the skin 2 (two) times daily. 45 mL 3   insulin lispro (HUMALOG KWIKPEN) 200 UNIT/ML KwikPen Inject 10-18 Units into the skin 3 (three) times daily before meals. 45 mL 3   isosorbide mononitrate (IMDUR) 60 MG 24 hr tablet Take 3 tablets (180 mg total) by mouth daily. 90 tablet 11   latanoprost (XALATAN) 0.005 % ophthalmic solution Place 1 drop into both eyes at bedtime.  Lidocaine HCl-Benzyl Alcohol (SALONPAS LIDOCAINE PLUS) 4-10 % CREA Apply 1 patch topically as needed (pain).     metoprolol succinate (TOPROL-XL) 25 MG 24 hr tablet Take 3 tablets by mouth once daily 90 tablet 3   nitroGLYCERIN (NITROSTAT) 0.4 MG SL tablet Place 1 tablet (0.4 mg total) under the tongue every 5 (five) minutes as needed for chest pain. 25 tablet 1   pantoprazole (PROTONIX) 40 MG tablet TAKE 1 TABLET BY MOUTH TWICE DAILY BEFORE MEAL(S) 180 tablet 0   rosuvastatin (CRESTOR) 20 MG tablet Take 20 mg by mouth daily.     Semaglutide, 1  MG/DOSE, (OZEMPIC, 1 MG/DOSE,) 4 MG/3ML SOPN Inject 1 mg into the skin once a week. (Patient taking differently: Inject 1 mg into the skin every Monday.) 9 mL 3   ticagrelor (BRILINTA) 90 MG TABS tablet Take 1 tablet by mouth twice daily 180 tablet 3   tretinoin (RETIN-A) 0.1 % cream Apply 1 application. topically daily as needed (skin irritation).     Wheat Dextrin (BENEFIBER) CHEW Chew 1 tablet by mouth daily.     No current facility-administered medications on file prior to visit.   Allergies  Allergen Reactions   Chlorthalidone Other (See Comments)    Causes dehydration, kidneys shut down   Family History  Problem Relation Age of Onset   Sudden death Mother    Diabetes Maternal Grandmother    Hypertension Maternal Grandmother    Heart attack Maternal Aunt    Sudden death Other    PE: BP 134/88 (BP Location: Right Arm, Patient Position: Sitting, Cuff Size: Normal)   Pulse 97   Ht '5\' 9"'  (1.753 m)   Wt 294 lb (133.4 kg)   SpO2 95%   BMI 43.42 kg/m  Wt Readings from Last 3 Encounters:  08/26/22 294 lb (133.4 kg)  07/19/22 293 lb (132.9 kg)  05/04/22 (!) 309 lb (140.2 kg)   Constitutional: overweight, in NAD Eyes: EOMI, no exophthalmos ENT: no thyromegaly, no cervical lymphadenopathy Cardiovascular: RRR, No MRG, + LE edema Respiratory: CTA B Musculoskeletal: + Deformities: Right transmetatarsal amputation Skin: no rashes Neurological: no tremor with outstretched hands  ASSESSMENT: 1. DM2, insulin-dependent, uncontrolled, with complications - CAD - s/p AMI 2012, s/p multiple PCI's; ischemic cardiomyopathy; CHF. Dr. Claiborne Billings - CKD stage 4 - DR - PN - s/p R transmetatarsal amputation - Gastroparesis - History of DKA  PLAN:  1. Patient with history of uncontrolled type 2 diabetes on SGLT2 inh., basal/bolus insulin regimen and weekly GLP-1 receptor agonist with improved control at last visit.  At that time, HbA1c was 8.1%, much improved from the previous value of 12.9%!  At  that time, he was finally using a freestyle libre CGM.  Reviewing the traces he had a lot of data loss so it was difficult to evaluate the ambulatory glucose profile.  However, reviewing individual daily traces, sugars were much better but still increasing after dinner as he was forgetting to take his Humalog before this meal.  He was usually taking it after the meal and he occasionally was dropping his blood sugars too low overnight.  We discussed about the absolute importance of taking the Humalog 15 minutes before meals.  Otherwise, we did not change his regimen. CGM interpretation: -At today's visit, we reviewed his CGM downloads: It appears that 87% of values are in target range (goal >70%), while 1% are higher than 180 (goal <25%), and 12% are lower than 70 (goal <4%).  The calculated average blood sugar is  114.  The projected HbA1c for the next 3 months (GMI) is lower than 6%, however, he only wore the sensor 25% of the time in the last 2 weeks. -Reviewing the CGM trends, sugars appear to be significantly improved, with the vast majority of the blood sugars being at goal except for low blood sugars overnight and especially between 4 and 6 AM.  He describes occasional hyperglycemic spikes after meals, but I do not see evidence of this in the last 2 weeks.  For now, we discussed about decreasing the dose of his Lantus to avoid low blood sugars and I also advised him to take less Humalog and to definitely try to take it before meals.  He feels that the blood sugars being low in the morning are related to him taking Humalog after dinner.  Discussed about trying his best to avoid this but if he has to take it after dinner to take a much lower amount. - I suggested to:  Patient Instructions  Please change: - Lantus 24 units a day 2x a day - Humalog 10-15 units 15 minutes before meals  Continue: - Ozempic 1 mg weekly  Please come back for a follow-up appointment in 4 months.  - we checked his HbA1c:  7.6% (improved further) - advised to check sugars at different times of the day - 4x a day, rotating check times - advised for yearly eye exams >> he is UTD - return to clinic in 4 months  2. HL -Reviewed his latest lipid panel from 07/2021: LDL very high: Lab Results  Component Value Date   CHOL 283 (H) 08/11/2021   HDL 89 08/11/2021   LDLCALC 169 (H) 08/11/2021   TRIG 145 08/11/2021   CHOLHDL 2.0 08/17/2019  -He is on Crestor 40 mg daily, restarted in 07/2021.  No side effects. -He has an annual physical exam coming up with PCP  3.  Obesity class III -He has large fluctuations in his weight, possibly contributed to by fluid status -I referred him to nutrition in the past that he was working with Antonieta Iba on improving his diet.  Barriers to improve nutrition: Increased beer intake, eating takeout frequently, sleeping only 5 hours a night. -He continues on Ozempic which should also help with weight loss -He was previously going to the weight management clinic but not anymore -He lost 14 pounds before last visit and lost another 15 pounds since then  Philemon Kingdom, MD PhD [facility] Endocrinology

## 2022-08-30 DIAGNOSIS — G8929 Other chronic pain: Secondary | ICD-10-CM | POA: Diagnosis not present

## 2022-08-30 DIAGNOSIS — N184 Chronic kidney disease, stage 4 (severe): Secondary | ICD-10-CM | POA: Diagnosis not present

## 2022-08-30 DIAGNOSIS — I10 Essential (primary) hypertension: Secondary | ICD-10-CM | POA: Diagnosis not present

## 2022-08-30 DIAGNOSIS — E785 Hyperlipidemia, unspecified: Secondary | ICD-10-CM | POA: Diagnosis not present

## 2022-08-30 DIAGNOSIS — I25119 Atherosclerotic heart disease of native coronary artery with unspecified angina pectoris: Secondary | ICD-10-CM | POA: Diagnosis not present

## 2022-08-30 DIAGNOSIS — E113493 Type 2 diabetes mellitus with severe nonproliferative diabetic retinopathy without macular edema, bilateral: Secondary | ICD-10-CM | POA: Diagnosis not present

## 2022-08-30 DIAGNOSIS — K219 Gastro-esophageal reflux disease without esophagitis: Secondary | ICD-10-CM | POA: Diagnosis not present

## 2022-09-01 ENCOUNTER — Other Ambulatory Visit (HOSPITAL_BASED_OUTPATIENT_CLINIC_OR_DEPARTMENT_OTHER): Payer: Self-pay | Admitting: General Practice

## 2022-09-02 DIAGNOSIS — H40053 Ocular hypertension, bilateral: Secondary | ICD-10-CM | POA: Diagnosis not present

## 2022-09-17 ENCOUNTER — Other Ambulatory Visit: Payer: Self-pay | Admitting: Internal Medicine

## 2022-09-28 ENCOUNTER — Telehealth: Payer: Self-pay | Admitting: *Deleted

## 2022-09-28 NOTE — Telephone Encounter (Signed)
   Name: Joshua [name]  DOB: 1956/03/10  MRN: [id number]  Primary Cardiologist: [name], MD   Preoperative team, please contact this patient and set up a phone call appointment for further preoperative risk assessment. Please obtain consent and complete medication review. Thank you for your help.  I confirm that guidance regarding antiplatelet and oral anticoagulation therapy has been completed and, if necessary, noted below.  Per office protocol, if patient is without any new symptoms or concerns at the time of their virtual visit, he may hold Brilinta for 5 days prior to procedure. Please resume Brilinta as soon as possible postprocedure, at the discretion of the surgeon.     Lenna Sciara, NP 09/28/2022, 4:52 PM [place]

## 2022-09-28 NOTE — Telephone Encounter (Signed)
   Pre-operative Risk Assessment    Patient Name: Joshua [name]  DOB: 1956/09/29 MRN: [id number]      Request for Surgical Clearance    Procedure:   COLONOSCOPY ; H/O COLON POLYPS ANS CONSTIPATION   Date of Surgery:  Clearance 11/23/22                                 Surgeon:  DR. Michail Sermon Surgeon's Group or Practice Name:  [facility] Phone number:  [phone] Fax number:  [phone]   Type of Clearance Requested:   - Medical  - Pharmacy:  Hold Ticagrelor (Brilinta)     Type of Anesthesia:   PROPOFOL   Additional requests/questions:    [name]   09/28/2022, 11:00 AM

## 2022-09-29 ENCOUNTER — Other Ambulatory Visit: Payer: Self-pay | Admitting: Internal Medicine

## 2022-09-29 ENCOUNTER — Other Ambulatory Visit: Payer: Self-pay | Admitting: Cardiovascular Disease

## 2022-09-29 ENCOUNTER — Telehealth: Payer: Self-pay | Admitting: *Deleted

## 2022-09-29 MED ORDER — NITROGLYCERIN 0.4 MG SL SUBL: 0.4000 mg | SUBLINGUAL_TABLET | SUBLINGUAL | 3 refills | Status: DC | PRN

## 2022-09-29 NOTE — Telephone Encounter (Signed)
Patient has  CAD S/P multiple PCIs refilled NTg sublingual tablets   Notified patient medication has been ready to  pick up . Patient states he is familiar how to use medication . Aware if no use ,he needs anew bottle each. Verbalized understanding.

## 2022-09-29 NOTE — Telephone Encounter (Signed)
Pt agreeable to plan of care for tele pre op appt 11/08/22 @ 9 am. Med rec and consent are done.

## 2022-09-29 NOTE — Telephone Encounter (Signed)
-----   Message from Michae Kava, [place] sent at 09/29/2022 10:00 AM EDT ----- Regarding: NTG? Good morning Rodell Perna you are having a good day!! I was speaking with this pt about pre op stuff. When I reviewed his medications with him and got to the NTG he said he has not had NTG for like 2 yrs as it ran out. I informed the pt that I will send Dr. Evette Georges nurse a message to d/w MD if he still wants the pt to carry NTG. I assured the pt if NTG was going to be called in that Dr. Evette Georges nurse will call him. He said Rx can be sent to [facility] on [address]. I appreciate your help in this matter. I just did not want to fill since he has not carried it for 2 yrs and wanted to be sure MD was on still wanting pt to carry NTG.   Thank you  Arbie Cookey

## 2022-09-29 NOTE — Telephone Encounter (Signed)
Pt agreeable to plan of care for tele pre op appt 11/08/22 @ 9 am. Med rec and consent are done.     Patient Consent for Virtual Visit        Joshua [name] has provided verbal consent on 09/29/2022 for a virtual visit (video or telephone).   CONSENT FOR VIRTUAL VISIT FOR:  Joshua [name]  By participating in this virtual visit I agree to the following:  I hereby voluntarily request, consent and authorize [facility] and its employed or contracted physicians, physician assistants, nurse practitioners or other licensed health care professionals (the Practitioner), to provide me with telemedicine health care services (the "Services") as deemed necessary by the treating Practitioner. I acknowledge and consent to receive the Services by the Practitioner via telemedicine. I understand that the telemedicine visit will involve communicating with the Practitioner through live audiovisual communication technology and the disclosure of certain medical information by electronic transmission. I acknowledge that I have been given the opportunity to request an in-person assessment or other available alternative prior to the telemedicine visit and am voluntarily participating in the telemedicine visit.  I understand that I have the right to withhold or withdraw my consent to the use of telemedicine in the course of my care at any time, without affecting my right to future care or treatment, and that the Practitioner or I may terminate the telemedicine visit at any time. I understand that I have the right to inspect all information obtained and/or recorded in the course of the telemedicine visit and may receive copies of available information for a reasonable fee.  I understand that some of the potential risks of receiving the Services via telemedicine include:  Delay or interruption in medical evaluation due to technological equipment failure or disruption; Information transmitted may not be  sufficient (e.g. poor resolution of images) to allow for appropriate medical decision making by the Practitioner; and/or  In rare instances, security protocols could fail, causing a breach of personal health information.  Furthermore, I acknowledge that it is my responsibility to provide information about my medical history, conditions and care that is complete and accurate to the best of my ability. I acknowledge that Practitioner's advice, recommendations, and/or decision may be based on factors not within their control, such as incomplete or inaccurate data provided by me or distortions of diagnostic images or specimens that may result from electronic transmissions. I understand that the practice of medicine is not an exact science and that Practitioner makes no warranties or guarantees regarding treatment outcomes. I acknowledge that a copy of this consent can be made available to me via my patient portal ([place]), or I can request a printed copy by calling the office of [facility].    I understand that my insurance will be billed for this visit.   I have read or had this consent read to me. I understand the contents of this consent, which adequately explains the benefits and risks of the Services being provided via telemedicine.  I have been provided ample opportunity to ask questions regarding this consent and the Services and have had my questions answered to my satisfaction. I give my informed consent for the services to be provided through the use of telemedicine in my medical care

## 2022-09-30 ENCOUNTER — Other Ambulatory Visit: Payer: Self-pay | Admitting: Cardiovascular Disease

## 2022-09-30 ENCOUNTER — Other Ambulatory Visit: Payer: Self-pay | Admitting: Neurology

## 2022-10-06 ENCOUNTER — Other Ambulatory Visit: Payer: Self-pay | Admitting: Internal Medicine

## 2022-10-06 ENCOUNTER — Telehealth: Payer: Self-pay

## 2022-10-06 DIAGNOSIS — E1159 Type 2 diabetes mellitus with other circulatory complications: Secondary | ICD-10-CM

## 2022-10-06 NOTE — Telephone Encounter (Signed)
Called requesting a updated referral for education.

## 2022-10-11 DIAGNOSIS — L03116 Cellulitis of left lower limb: Secondary | ICD-10-CM | POA: Diagnosis not present

## 2022-10-15 ENCOUNTER — Ambulatory Visit (INDEPENDENT_AMBULATORY_CARE_PROVIDER_SITE_OTHER): Payer: Medicare Other | Admitting: Podiatry

## 2022-10-15 ENCOUNTER — Telehealth: Payer: Self-pay | Admitting: Podiatry

## 2022-10-15 ENCOUNTER — Ambulatory Visit (INDEPENDENT_AMBULATORY_CARE_PROVIDER_SITE_OTHER): Payer: Medicare Other

## 2022-10-15 VITALS — BP 168/86 | HR 93 | Temp 98.2°F | Resp 18

## 2022-10-15 DIAGNOSIS — M79671 Pain in right foot: Secondary | ICD-10-CM

## 2022-10-15 DIAGNOSIS — I739 Peripheral vascular disease, unspecified: Secondary | ICD-10-CM

## 2022-10-15 DIAGNOSIS — L97529 Non-pressure chronic ulcer of other part of left foot with unspecified severity: Secondary | ICD-10-CM

## 2022-10-15 DIAGNOSIS — L97522 Non-pressure chronic ulcer of other part of left foot with fat layer exposed: Secondary | ICD-10-CM

## 2022-10-15 NOTE — Patient Instructions (Addendum)
Keep foot ulcer clean. Apply a small amount of silvadene to the ulcer daily and wrap the foot. Continue doxycyline  I have ordered a circulation test to make sure you have adequate blood flow. If you do not hear for them about scheduling within the next 1 week, or you have any questions please give [place] a call at [phone].

## 2022-10-15 NOTE — Telephone Encounter (Signed)
Patient called and stated that he was seen this morning. He wanted to know even though he is suppose to change the dressing on his foot everyday, he wanted to know if he can get his foot wet.  Please advise

## 2022-10-15 NOTE — Progress Notes (Signed)
Subjective:   Patient ID: Joshua [name], male   DOB: 66 y.o.   MRN: [id number]   HPI Chief Complaint  Patient presents with   Diabetic Ulcer    Left foot diabetic ulcer started 2 weeks ago,rate of pain 6 out of 10, A1c-7.6 BG- 134, some drainage,  TX doxycyline     66 year old male presents for above concerns.  About 2 weeks ago he noticed what appeared to be a blister on the side of his left foot to monitor.  He states he is currently sleeping in his car and also his shoes that are tight causing irritation.  He has applied for shelters as well as recently been staying with his son.[name]  He has no significant pain.  No drainage or pus that he reports.  He has a transmetatarsal amputation on the right foot wants to try to avoid this happening on his left side.  He recently to our walk-in clinic earlier this week and was started on doxycycline and he is still taking this.  Review of Systems  All other systems reviewed and are negative.  Past Medical History:  Diagnosis Date   Acute coronary syndrome (HCC)    Anemia    B12 deficiency    Chest pain    CHF (congestive heart failure) (HCC)    Chronic combined systolic and diastolic CHF (congestive heart failure) ([place])    a. 07/2015 Echo: EF 45-50%, mod LVH, mid apical/antsept AK, Gr 1 DD.   Chronic low back pain    CKD (chronic kidney disease), stage III (HCC)    Constipation    COPD (chronic obstructive pulmonary disease) (HCC)    Coronary artery disease    a. 2012 s/p MI/PCI: s/p DES to mid/dist RCA and overlapping DES to LAD;  b. 07/2015 Cath: LAD 10% ISR, D1 40(jailed), LCX 84m OM2 30, OM3 30, RCA 591mSR, 10d ISR; c. 07/2016 NSTEMI/PCI: LAD 85ost/p/m (3.5x20 Synergy DES overlapping prior stent), D1 40, LCX 7561mM2 95 (2.75x16 Synergy DES), OM3 30, RCA 5m98md.   Depression    Diabetic gastroparesis (HCC)[place] Diabetic polyneuropathy (HCC)[place] DKA (diabetic ketoacidoses) 03/14/2018   GERD (gastroesophageal reflux disease)    Gout     Heart failure (HCC)    Heart murmur    History of MI (myocardial infarction)    Hyperlipemia    Hypertension    Hypertensive heart disease    Hypokalemia    Ischemic cardiomyopathy    a. 07/2015 Echo: EF 45-50%, mod LVH, mid-apicalanteroseptal AK, Gr1 DD.   Joint pain    Kidney problem    Morbid obesity (HCC)[place] OSA on CPAP    Osteoarthritis    Osteomyelitis (HCC)[place] a. 07/2016 MTP joint of R great toe s/p transmetatarsal amputation.   Other fatigue    Polyneuropathy in diabetes(357.2)    Pulmonary sarcoidosis (HCC)[place] "problems w/it years ago; no problems anymore" (07/03/2014)   Rectal bleeding    Shortness of breath    Shortness of breath on exertion    Sleep apnea    Stomach ulcer    Swallowing difficulty    Type II diabetes mellitus (HCC)[place]  Past Surgical History:  Procedure Laterality Date   AMPUTATION Right 07/24/2016   Procedure: TRANSMETATARSAL FOOT AMPUTATION;  Surgeon: MarcNewt Minion;  [facility];  Laterality: Right;   BALLOON DILATION N/A 03/21/2018   Procedure: BALLOON  DILATION;  Surgeon: Buccini, Robert, MD;  [facility];  Service: Endoscopy;  Laterality: N/A;   BIOPSY  03/16/2022   Procedure: BIOPSY;  Surgeon: Schooler, Vincent, MD;  [facility];  Service: Endoscopy;;   BREAST LUMPECTOMY Right ~ 2000   "benign"   CARDIAC CATHETERIZATION N/A 07/30/2015   Procedure: Left Heart Cath and Coronary Angiography;  Surgeon: Christopher D McAlhany, MD;  [facility];  Service: Cardiovascular;  Laterality: N/A;   CARDIAC CATHETERIZATION N/A 07/19/2016   Procedure: Left Heart Cath and Coronary Angiography;  Surgeon: Henry W Smith, MD;  [facility];  Service: Cardiovascular;  Laterality: N/A;   CARDIAC CATHETERIZATION N/A 07/29/2016   Procedure: Coronary Stent Intervention;  Surgeon: Henry W Smith, MD;  [facility];  Service: Cardiovascular;  Laterality: N/A;   CARDIAC  CATHETERIZATION N/A 07/29/2016   Procedure: Intravascular Ultrasound/IVUS;  Surgeon: Henry W Smith, MD;  [facility];  Service: Cardiovascular;  Laterality: N/A;   CATARACT EXTRACTION W/ INTRAOCULAR LENS  IMPLANT, BILATERAL Bilateral 2014-2015   COLONOSCOPY WITH PROPOFOL N/A 08/22/2017   Procedure: COLONOSCOPY WITH PROPOFOL;  Surgeon: Schooler, Vincent, MD;  [facility];  Service: Endoscopy;  Laterality: N/A;   COLONOSCOPY WITH PROPOFOL N/A 03/16/2022   Procedure: COLONOSCOPY WITH PROPOFOL;  Surgeon: Schooler, Vincent, MD;  [facility];  Service: Endoscopy;  Laterality: N/A;   CORONARY ANGIOPLASTY WITH STENT PLACEMENT  10/31/2011   30 % MID ATRIOVENTRICULAR GROOVE CX STENOSIS. 90 % MID RCA.PTA TO LAD/STENTING OF TANDEM 60-70%. LAD STENOSIS 99% REDUCED TO 0%.3.0 X 32MM PROMUS DES POSTDILATED TO 3.25MM.   CORONARY ANGIOPLASTY WITH STENT PLACEMENT  07/03/2014   "2"   CORONARY STENT INTERVENTION N/A 04/09/2019   Procedure: CORONARY STENT INTERVENTION;  Surgeon: Varanasi, Jayadeep S, MD;  [facility];  Service: Cardiovascular;  Laterality: N/A;   CORONARY STENT INTERVENTION N/A 08/22/2019   Procedure: CORONARY STENT INTERVENTION;  Surgeon: Jordan, Peter M, MD;  [facility];  Service: Cardiovascular;  Laterality: N/A;   ESOPHAGOGASTRODUODENOSCOPY (EGD) WITH PROPOFOL N/A 03/21/2018   Procedure: ESOPHAGOGASTRODUODENOSCOPY (EGD) WITH PROPOFOL;  Surgeon: Buccini, Robert, MD;  [facility];  Service: Endoscopy;  Laterality: N/A;   ESOPHAGOGASTRODUODENOSCOPY (EGD) WITH PROPOFOL N/A 03/16/2022   Procedure: ESOPHAGOGASTRODUODENOSCOPY (EGD) WITH PROPOFOL;  Surgeon: Schooler, Vincent, MD;  [facility];  Service: Endoscopy;  Laterality: N/A;   I & D EXTREMITY Right 10/30/2013   Procedure: IRRIGATION AND DEBRIDEMENT RIGHT SMALL FINGER POSSIBLE SECONDARY WOUND CLOSURE;  Surgeon:  A Weingold, MD;  [facility];  Service:  Orthopedics;  Laterality: Right;  Weingold is available after 4pm   I & D EXTREMITY Right 11/01/2013   Procedure:  IRRIGATION AND DEBRIDEMENT RIGHT  PROXIMAL PHALANGEAL LEVEL AMPUTATION;  Surgeon:  A Weingold, MD;  [facility];  Service: Orthopedics;  Laterality: Right;   INCISION AND DRAINAGE Right 10/28/2013   Procedure: INCISION AND DRAINAGE Right small finger;  Surgeon:  A Weingold, MD;  [facility];  Service: Orthopedics;  Laterality: Right;   LEFT HEART CATH Left 11/03/2011   Procedure: LEFT HEART CATH;  Surgeon: Thomas A Kelly, MD;  [facility];  Service: Cardiovascular;  Laterality: Left;   LEFT HEART CATH AND CORONARY ANGIOGRAPHY N/A 04/09/2019   Procedure: LEFT HEART CATH AND CORONARY ANGIOGRAPHY;  Surgeon: Varanasi, Jayadeep S, MD;  [facility];  Service: Cardiovascular;  Laterality: N/A;   LEFT HEART CATHETERIZATION   WITH CORONARY ANGIOGRAM N/A 07/03/2014   Procedure: LEFT HEART CATHETERIZATION WITH CORONARY ANGIOGRAM;  Surgeon: [name], MD;  Location[facility] CATH LAB;  Service: Cardiovascular;  Laterality: N/A;   PERCUTANEOUS CORONARY STENT INTERVENTION (PCI-S) Left 11/03/2011   Procedure: PERCUTANEOUS CORONARY STENT INTERVENTION (PCI-S);  Surgeon: [name], MD;  Location: [facility] CATH LAB;  Service: Cardiovascular;  Laterality: Left;   RIGHT/LEFT HEART CATH AND CORONARY ANGIOGRAPHY N/A 08/22/2019   Procedure: RIGHT/LEFT HEART CATH AND CORONARY ANGIOGRAPHY;  Surgeon: Martinique, Peter M, MD;  Location: [place] CV LAB;  Service: Cardiovascular;  Laterality: N/A;   TOE AMPUTATION Right ~ 2010   "2nd toe"   TOE AMPUTATION Right 2012   "3rd toe"     Current Outpatient Medications:    Accu-Chek FastClix Lancets MISC, 1 each by Other route in the morning, at noon, in the evening, and at bedtime. , Disp: , Rfl:    ACCU-CHEK GUIDE test strip, USE UP TO FOUR TIMES DAILY AS DIRECTED (Patient taking differently: 1 each by Other route See  admin instructions. USE UP TO FOUR TIMES DAILY AS DIRECTED), Disp: 300 each, Rfl: 3   acetaminophen (TYLENOL) 650 MG CR tablet, Take 1,300 mg by mouth daily as needed for pain., Disp: , Rfl:    allopurinol (ZYLOPRIM) 300 MG tablet, Take 300 mg by mouth daily. , Disp: , Rfl:    aspirin EC 81 MG tablet, Take 81 mg by mouth daily. , Disp: , Rfl:    atorvastatin (LIPITOR) 80 MG tablet, Take 80 mg by mouth daily., Disp: , Rfl:    blood glucose meter kit and supplies, Dispense based on patient and insurance preference. Use up to four times daily as directed. (FOR ICD-10 E10.9, E11.9). (Patient taking differently: 1 each by Other route See admin instructions. Dispense based on patient and insurance preference. Use up to four times daily as directed. (FOR ICD-10 E10.9, E11.9).), Disp: 1 each, Rfl: 0   cyclobenzaprine (FLEXERIL) 10 MG tablet, Take 10 mg by mouth at bedtime as needed for muscle spasms., Disp: , Rfl:    dapagliflozin propanediol (FARXIGA) 10 MG TABS tablet, Take 10 mg by mouth daily., Disp: , Rfl:    famotidine (PEPCID) 20 MG tablet, Take 20 mg by mouth at bedtime., Disp: , Rfl:    furosemide (LASIX) 80 MG tablet, Take 1 tablet by mouth twice daily, Disp: 60 tablet, Rfl: 11   gabapentin (NEURONTIN) 100 MG capsule, Take 1 capsule by mouth at bedtime, Disp: 30 capsule, Rfl: 5   HUMALOG KWIKPEN 200 UNIT/ML KwikPen, INJECT 10 TO 18 UNITS SUBCUTANEOUSLY THREE TIMES DAILY BEFORE MEAL(S), Disp: 24 mL, Rfl: 0   insulin glargine (LANTUS SOLOSTAR) 100 UNIT/ML Solostar Pen, Inject 24-26 Units into the skin 2 (two) times daily., Disp: 45 mL, Rfl: 3   isosorbide mononitrate (IMDUR) 60 MG 24 hr tablet, Take 3 tablets (180 mg total) by mouth daily., Disp: 90 tablet, Rfl: 11   latanoprost (XALATAN) 0.005 % ophthalmic solution, Place 1 drop into both eyes at bedtime., Disp: , Rfl:    Lidocaine HCl-Benzyl Alcohol (SALONPAS LIDOCAINE PLUS) 4-10 % CREA, Apply 1 patch topically as needed (pain)., Disp: , Rfl:     metoprolol succinate (TOPROL-XL) 25 MG 24 hr tablet, Take 3 tablets by mouth every day, Disp: 90 tablet, Rfl: 11   nitroGLYCERIN (NITROSTAT) 0.4 MG SL tablet, Place 1 tablet (0.4 mg total) under the tongue every 5 (five) minutes as needed for chest pain., Disp: 25 tablet, Rfl:  3   OZEMPIC, 1 MG/DOSE, 4 MG/3ML SOPN, INJECT 1 MG SUBCUTANEOUSLY ONCE A WEEK, Disp: 9 mL, Rfl: 0   pantoprazole (PROTONIX) 40 MG tablet, TAKE 1 TABLET BY MOUTH TWICE DAILY BEFORE MEAL(S), Disp: 180 tablet, Rfl: 0   rosuvastatin (CRESTOR) 20 MG tablet, Take 20 mg by mouth daily. (Patient not taking: Reported on 09/29/2022), Disp: , Rfl:    ticagrelor (BRILINTA) 90 MG TABS tablet, Take 1 tablet by mouth twice daily, Disp: 180 tablet, Rfl: 3   tretinoin (RETIN-A) 0.1 % cream, Apply 1 application. topically daily as needed (skin irritation)., Disp: , Rfl:    Wheat Dextrin (BENEFIBER) CHEW, Chew 1 tablet by mouth daily., Disp: , Rfl:   Allergies  Allergen Reactions   Chlorthalidone Other (See Comments)    Causes dehydration, kidneys shut down          Objective:  Physical Exam  General: AAO x3, NAD  Dermatological: Large granular wound present on the lateral aspect of the left foot with overlying hyperkeratotic, old blister skin formation.  After debridement the wound measured 4.1 x 4.1 x 0.1 cm with a granular base without any surrounding erythema, ascending cellulitis.  No fluctuance or crepitation there is no drainage or pus.  No malodor.  On the dorsal aspect of the right midfoot, small scab and also what appears to be a preulcerative area in the right fifth metatarsal base without any drainage or pus or any signs of infection.  No other open lesions are noted at this time.         Vascular: Dorsalis Pedis artery and Posterior Tibial artery pedal pulses are 1/4 bilateral with immedate capillary fill time. There is no pain with calf compression, swelling, warmth, erythema.  Chronic appearing ankle edema  present.  Neruologic: Sensation decreased  Musculoskeletal: No pain on exam  Gait: Unassisted, Nonantalgic.   Assessment:   66-year-old male with large ulceration left foot and preulcerative lesions right foot.     Plan:  -Treatment options discussed including all alternatives, risks, and complications -Etiology of symptoms were discussed -X-rays were obtained and reviewed with the patient.  3 views left foot were obtained.  Old fracture noted the fifth metatarsal base but no definitive evidence of acute fracture or osteomyelitis.  No soft tissue emphysema. -Utilizing #312 with scalpel was able to debride the loose tissue overlying the wound without any complications or bleeding. -Silvadene was applied and I also prescribed this.  Discussed daily dressing changes -Continue doxycycline -Blood work ordered including CBC, sed rate, CRP, BMP -ABI ordered -Surgical shoe was dispensed for offloading bilaterally. -Monitor for any clinical signs or symptoms of infection and directed to call the office immediately should any occur or go to the ER.  Return in about 10 days (around 10/25/2022) for ulcer.   R  DPM      

## 2022-10-16 ENCOUNTER — Other Ambulatory Visit: Payer: Self-pay | Admitting: Internal Medicine

## 2022-10-19 ENCOUNTER — Other Ambulatory Visit: Payer: Self-pay | Admitting: Podiatry

## 2022-10-19 ENCOUNTER — Telehealth: Payer: Self-pay | Admitting: *Deleted

## 2022-10-19 MED ORDER — SILVER SULFADIAZINE 1 % EX CREA: 1.0000 | TOPICAL_CREAM | Freq: Every day | CUTANEOUS | 0 refills | Status: DC

## 2022-10-19 MED ORDER — DOXYCYCLINE HYCLATE 100 MG PO TABS: 100.0000 mg | ORAL_TABLET | Freq: Two times a day (BID) | ORAL | 0 refills | Status: DC

## 2022-10-19 NOTE — Telephone Encounter (Signed)
Patient has been notified thru voice message

## 2022-10-19 NOTE — Telephone Encounter (Signed)
Patient is calling for the status of a prescription that was supposed to be sent to pharmacy on file,not received.Is living in his car and sample given was lost in there. Please advise.

## 2022-10-21 DIAGNOSIS — M109 Gout, unspecified: Secondary | ICD-10-CM | POA: Diagnosis not present

## 2022-10-21 DIAGNOSIS — N183 Chronic kidney disease, stage 3 unspecified: Secondary | ICD-10-CM | POA: Diagnosis not present

## 2022-10-21 DIAGNOSIS — N184 Chronic kidney disease, stage 4 (severe): Secondary | ICD-10-CM | POA: Diagnosis not present

## 2022-10-21 DIAGNOSIS — N2581 Secondary hyperparathyroidism of renal origin: Secondary | ICD-10-CM | POA: Diagnosis not present

## 2022-10-21 DIAGNOSIS — I129 Hypertensive chronic kidney disease with stage 1 through stage 4 chronic kidney disease, or unspecified chronic kidney disease: Secondary | ICD-10-CM | POA: Diagnosis not present

## 2022-10-21 DIAGNOSIS — I5032 Chronic diastolic (congestive) heart failure: Secondary | ICD-10-CM | POA: Diagnosis not present

## 2022-10-21 DIAGNOSIS — D631 Anemia in chronic kidney disease: Secondary | ICD-10-CM | POA: Diagnosis not present

## 2022-10-25 ENCOUNTER — Ambulatory Visit (INDEPENDENT_AMBULATORY_CARE_PROVIDER_SITE_OTHER): Payer: Medicare Other | Admitting: Podiatry

## 2022-10-25 DIAGNOSIS — I739 Peripheral vascular disease, unspecified: Secondary | ICD-10-CM | POA: Diagnosis not present

## 2022-10-25 DIAGNOSIS — L97522 Non-pressure chronic ulcer of other part of left foot with fat layer exposed: Secondary | ICD-10-CM

## 2022-10-25 NOTE — Progress Notes (Unsigned)
Subjective: Chief Complaint  Patient presents with   Foot Ulcer   66 year old male presents the office with above concerns.  Is some bloody drainage but no pus.  No increase in swelling or redness.  Has been changing dressing daily with Silvadene.  Denies any fevers or chills.  Last A1c was 7.6 on August 26, 2022  Objective: AAO x3, NAD DP/PT pulses palpable bilaterally, CRT less than 3 seconds Large granular ulceration noted on the lateral aspect of the fifth metatarsal base on the left foot from where he had the blister.  Today the wound measures 3.8 x 3.8 x 0.1 cm.  Minimal hyperkeratotic periwound.  Pre and post wound measurements were the same.  There is no probing, undermining or tunneling.  No fluctuation or crepitation. No pain with calf compression, swelling, warmth, erythema  Assessment: Ulceration left foot  Plan: -All treatment options discussed with the patient including all alternatives, risks, complications.  -Today I sharply debrided the wound to debride the hyperkeratotic periwound.  Also debrided the wound to remove any biofilm.  There is minimal blood loss and this is achieved through manual compression.  I debrided the wound with #312 with scalpel.  Silvadene was applied followed by dressing.  Continue with daily dressing changes.  Can wash with soap and water daily, dry thoroughly apply this.  Continue offloading. -A cells antibiotics and recommend to complete the course. -Blood work ordered last appointment- not completed yet -ABI- scheduled for 11/01/2022 -Patient encouraged to call the office with any questions, concerns, change in symptoms.   Trula Slade DPM

## 2022-10-29 ENCOUNTER — Telehealth: Payer: Self-pay | Admitting: Dietician

## 2022-10-29 ENCOUNTER — Ambulatory Visit: Payer: Medicare Other | Admitting: Dietician

## 2022-10-29 NOTE — Telephone Encounter (Signed)
Called patient as he missed his appointment with me today.  Noted that he now has a foot would.  Concerned due to recent social issues.  Patient was not available and his voicemail was full.  Antonieta Iba, RD, LDN, CDCES

## 2022-10-31 ENCOUNTER — Other Ambulatory Visit: Payer: Self-pay | Admitting: Internal Medicine

## 2022-11-01 ENCOUNTER — Ambulatory Visit (HOSPITAL_COMMUNITY)
Admission: RE | Admit: 2022-11-01 | Discharge: 2022-11-01 | Disposition: A | Discharge status: Home / Self Care | Payer: Medicare Other | Source: Ambulatory Visit | Attending: Internal Medicine | Admitting: Internal Medicine

## 2022-11-01 DIAGNOSIS — L97522 Non-pressure chronic ulcer of other part of left foot with fat layer exposed: Secondary | ICD-10-CM | POA: Diagnosis not present

## 2022-11-05 ENCOUNTER — Inpatient Hospital Stay (HOSPITAL_COMMUNITY)
Admission: EM | Admit: 2022-11-05 | Discharge: 2022-11-16 | DRG: 628 | Disposition: A | Discharge status: Home Health | Payer: Medicare Other | Attending: Internal Medicine | Admitting: Internal Medicine

## 2022-11-05 ENCOUNTER — Emergency Department (HOSPITAL_COMMUNITY): Payer: Medicare Other

## 2022-11-05 ENCOUNTER — Telehealth: Payer: Medicare Other | Admitting: Physician Assistant

## 2022-11-05 ENCOUNTER — Other Ambulatory Visit: Payer: Self-pay

## 2022-11-05 DIAGNOSIS — D631 Anemia in chronic kidney disease: Secondary | ICD-10-CM | POA: Diagnosis not present

## 2022-11-05 DIAGNOSIS — M869 Osteomyelitis, unspecified: Secondary | ICD-10-CM | POA: Diagnosis present

## 2022-11-05 DIAGNOSIS — G4733 Obstructive sleep apnea (adult) (pediatric): Secondary | ICD-10-CM | POA: Diagnosis present

## 2022-11-05 DIAGNOSIS — Z8711 Personal history of peptic ulcer disease: Secondary | ICD-10-CM

## 2022-11-05 DIAGNOSIS — Z794 Long term (current) use of insulin: Secondary | ICD-10-CM

## 2022-11-05 DIAGNOSIS — I152 Hypertension secondary to endocrine disorders: Secondary | ICD-10-CM | POA: Diagnosis present

## 2022-11-05 DIAGNOSIS — M79605 Pain in left leg: Secondary | ICD-10-CM

## 2022-11-05 DIAGNOSIS — M545 Low back pain, unspecified: Secondary | ICD-10-CM | POA: Diagnosis present

## 2022-11-05 DIAGNOSIS — E43 Unspecified severe protein-calorie malnutrition: Secondary | ICD-10-CM | POA: Diagnosis present

## 2022-11-05 DIAGNOSIS — Z6841 Body Mass Index (BMI) 40.0 and over, adult: Secondary | ICD-10-CM

## 2022-11-05 DIAGNOSIS — E119 Type 2 diabetes mellitus without complications: Secondary | ICD-10-CM | POA: Diagnosis not present

## 2022-11-05 DIAGNOSIS — E1169 Type 2 diabetes mellitus with other specified complication: Secondary | ICD-10-CM | POA: Diagnosis not present

## 2022-11-05 DIAGNOSIS — E11621 Type 2 diabetes mellitus with foot ulcer: Secondary | ICD-10-CM | POA: Diagnosis not present

## 2022-11-05 DIAGNOSIS — E1122 Type 2 diabetes mellitus with diabetic chronic kidney disease: Secondary | ICD-10-CM | POA: Diagnosis not present

## 2022-11-05 DIAGNOSIS — X58XXXA Exposure to other specified factors, initial encounter: Secondary | ICD-10-CM | POA: Diagnosis present

## 2022-11-05 DIAGNOSIS — L97429 Non-pressure chronic ulcer of left heel and midfoot with unspecified severity: Secondary | ICD-10-CM | POA: Diagnosis present

## 2022-11-05 DIAGNOSIS — Z8249 Family history of ischemic heart disease and other diseases of the circulatory system: Secondary | ICD-10-CM

## 2022-11-05 DIAGNOSIS — J449 Chronic obstructive pulmonary disease, unspecified: Secondary | ICD-10-CM | POA: Diagnosis present

## 2022-11-05 DIAGNOSIS — I5032 Chronic diastolic (congestive) heart failure: Secondary | ICD-10-CM | POA: Diagnosis present

## 2022-11-05 DIAGNOSIS — L089 Local infection of the skin and subcutaneous tissue, unspecified: Secondary | ICD-10-CM

## 2022-11-05 DIAGNOSIS — Z6839 Body mass index (BMI) 39.0-39.9, adult: Secondary | ICD-10-CM

## 2022-11-05 DIAGNOSIS — B964 Proteus (mirabilis) (morganii) as the cause of diseases classified elsewhere: Secondary | ICD-10-CM | POA: Diagnosis present

## 2022-11-05 DIAGNOSIS — T501X5A Adverse effect of loop [high-ceiling] diuretics, initial encounter: Secondary | ICD-10-CM | POA: Diagnosis present

## 2022-11-05 DIAGNOSIS — E1143 Type 2 diabetes mellitus with diabetic autonomic (poly)neuropathy: Secondary | ICD-10-CM | POA: Diagnosis present

## 2022-11-05 DIAGNOSIS — I5030 Unspecified diastolic (congestive) heart failure: Secondary | ICD-10-CM | POA: Diagnosis present

## 2022-11-05 DIAGNOSIS — K219 Gastro-esophageal reflux disease without esophagitis: Secondary | ICD-10-CM | POA: Diagnosis present

## 2022-11-05 DIAGNOSIS — E876 Hypokalemia: Secondary | ICD-10-CM | POA: Diagnosis not present

## 2022-11-05 DIAGNOSIS — D72829 Elevated white blood cell count, unspecified: Secondary | ICD-10-CM | POA: Diagnosis present

## 2022-11-05 DIAGNOSIS — Z5901 Sheltered homelessness: Secondary | ICD-10-CM

## 2022-11-05 DIAGNOSIS — E1159 Type 2 diabetes mellitus with other circulatory complications: Secondary | ICD-10-CM | POA: Diagnosis not present

## 2022-11-05 DIAGNOSIS — E785 Hyperlipidemia, unspecified: Secondary | ICD-10-CM | POA: Diagnosis not present

## 2022-11-05 DIAGNOSIS — I10 Essential (primary) hypertension: Secondary | ICD-10-CM | POA: Diagnosis present

## 2022-11-05 DIAGNOSIS — E1165 Type 2 diabetes mellitus with hyperglycemia: Secondary | ICD-10-CM | POA: Diagnosis not present

## 2022-11-05 DIAGNOSIS — Z89431 Acquired absence of right foot: Secondary | ICD-10-CM

## 2022-11-05 DIAGNOSIS — E11628 Type 2 diabetes mellitus with other skin complications: Secondary | ICD-10-CM | POA: Diagnosis present

## 2022-11-05 DIAGNOSIS — G4730 Sleep apnea, unspecified: Secondary | ICD-10-CM | POA: Diagnosis present

## 2022-11-05 DIAGNOSIS — I2510 Atherosclerotic heart disease of native coronary artery without angina pectoris: Secondary | ICD-10-CM | POA: Diagnosis present

## 2022-11-05 DIAGNOSIS — G8929 Other chronic pain: Secondary | ICD-10-CM | POA: Diagnosis present

## 2022-11-05 DIAGNOSIS — Z955 Presence of coronary angioplasty implant and graft: Secondary | ICD-10-CM

## 2022-11-05 DIAGNOSIS — I255 Ischemic cardiomyopathy: Secondary | ICD-10-CM | POA: Diagnosis present

## 2022-11-05 DIAGNOSIS — Z79899 Other long term (current) drug therapy: Secondary | ICD-10-CM

## 2022-11-05 DIAGNOSIS — E1142 Type 2 diabetes mellitus with diabetic polyneuropathy: Secondary | ICD-10-CM | POA: Diagnosis not present

## 2022-11-05 DIAGNOSIS — N184 Chronic kidney disease, stage 4 (severe): Secondary | ICD-10-CM | POA: Diagnosis present

## 2022-11-05 DIAGNOSIS — Z833 Family history of diabetes mellitus: Secondary | ICD-10-CM

## 2022-11-05 DIAGNOSIS — Z888 Allergy status to other drugs, medicaments and biological substances status: Secondary | ICD-10-CM

## 2022-11-05 DIAGNOSIS — M7989 Other specified soft tissue disorders: Secondary | ICD-10-CM | POA: Diagnosis not present

## 2022-11-05 DIAGNOSIS — L97522 Non-pressure chronic ulcer of other part of left foot with fat layer exposed: Secondary | ICD-10-CM

## 2022-11-05 DIAGNOSIS — Z7982 Long term (current) use of aspirin: Secondary | ICD-10-CM

## 2022-11-05 DIAGNOSIS — Z7984 Long term (current) use of oral hypoglycemic drugs: Secondary | ICD-10-CM

## 2022-11-05 DIAGNOSIS — I252 Old myocardial infarction: Secondary | ICD-10-CM

## 2022-11-05 DIAGNOSIS — E118 Type 2 diabetes mellitus with unspecified complications: Secondary | ICD-10-CM

## 2022-11-05 DIAGNOSIS — Z7901 Long term (current) use of anticoagulants: Secondary | ICD-10-CM

## 2022-11-05 DIAGNOSIS — T8131XA Disruption of external operation (surgical) wound, not elsewhere classified, initial encounter: Secondary | ICD-10-CM

## 2022-11-05 LAB — BASIC METABOLIC PANEL
Anion gap: 14 (ref 5–15)
BUN: 30 mg/dL — ABNORMAL HIGH (ref 8–23)
CO2: 22 mmol/L (ref 22–32)
Calcium: 8.9 mg/dL (ref 8.9–10.3)
Chloride: 99 mmol/L (ref 98–111)
Creatinine, Ser: 2.44 mg/dL — ABNORMAL HIGH (ref 0.61–1.24)
GFR, Estimated: 29 mL/min — ABNORMAL LOW (ref >60)
Glucose, Bld: 214 mg/dL — ABNORMAL HIGH (ref 70–99)
Potassium: 3.1 mmol/L — ABNORMAL LOW (ref 3.5–5.1)
Sodium: 135 mmol/L (ref 135–145)

## 2022-11-05 LAB — CBC WITH DIFFERENTIAL/PLATELET
Abs Immature Granulocytes: 0.12 10*3/uL — ABNORMAL HIGH (ref 0.00–0.07)
Basophils Absolute: 0 10*3/uL (ref 0.0–0.1)
Basophils Relative: 0 %
Eosinophils Absolute: 0 10*3/uL (ref 0.0–0.5)
Eosinophils Relative: 0 %
HCT: 39.8 % (ref 39.0–52.0)
Hemoglobin: 12.6 g/dL — ABNORMAL LOW (ref 13.0–17.0)
Immature Granulocytes: 1 %
Lymphocytes Relative: 11 %
Lymphs Abs: 1.4 10*3/uL (ref 0.7–4.0)
MCH: 27.6 pg (ref 26.0–34.0)
MCHC: 31.7 g/dL (ref 30.0–36.0)
MCV: 87.3 fL (ref 80.0–100.0)
Monocytes Absolute: 1.4 10*3/uL — ABNORMAL HIGH (ref 0.1–1.0)
Monocytes Relative: 11 %
Neutro Abs: 9.6 10*3/uL — ABNORMAL HIGH (ref 1.7–7.7)
Neutrophils Relative %: 77 %
Platelets: 272 10*3/uL (ref 150–400)
RBC: 4.56 MIL/uL (ref 4.22–5.81)
RDW: 18.1 % — ABNORMAL HIGH (ref 11.5–15.5)
WBC: 12.5 10*3/uL — ABNORMAL HIGH (ref 4.0–10.5)
nRBC: 0 % (ref 0.0–0.2)

## 2022-11-05 LAB — CBG MONITORING, ED: Glucose-Capillary: 96 mg/dL (ref 70–99)

## 2022-11-05 MED ORDER — PIPERACILLIN-TAZOBACTAM 3.375 G IVPB: 3.3750 g | Freq: Three times a day (TID) | INTRAVENOUS | Status: DC

## 2022-11-05 MED ORDER — OXYCODONE-ACETAMINOPHEN 5-325 MG PO TABS
1.0000 | ORAL_TABLET | Freq: Once | ORAL | Status: AC
  Administered 2022-11-05: 1 via ORAL
  Filled 2022-11-05: qty 1

## 2022-11-05 MED ORDER — VANCOMYCIN HCL 10 G IV SOLR
2500.0000 mg | Freq: Once | INTRAVENOUS | Status: DC
  Filled 2022-11-05: qty 25

## 2022-11-05 MED ORDER — PIPERACILLIN-TAZOBACTAM 3.375 G IVPB 30 MIN
3.3750 g | Freq: Once | INTRAVENOUS | Status: AC
  Administered 2022-11-05: 3.375 g via INTRAVENOUS
  Filled 2022-11-05: qty 50

## 2022-11-05 MED ORDER — VANCOMYCIN HCL IN DEXTROSE 1-5 GM/200ML-% IV SOLN: 1000.0000 mg | INTRAVENOUS | Status: DC

## 2022-11-05 MED ORDER — POTASSIUM CHLORIDE CRYS ER 20 MEQ PO TBCR
40.0000 meq | EXTENDED_RELEASE_TABLET | Freq: Once | ORAL | Status: AC
  Administered 2022-11-06: 40 meq via ORAL
  Filled 2022-11-05: qty 2

## 2022-11-05 NOTE — ED Triage Notes (Signed)
Pt with chronic wound to left foot.  Had a virtual visit today and stated he was told it may be a good idea to have it evaluated.  Has been seeing a foot specialist.  No fever/chills but is in a lot of pain.

## 2022-11-05 NOTE — Progress Notes (Signed)
Virtual Visit Consent   Joshua [name], you are scheduled for a virtual visit with a [place] provider today. Just as with appointments in the office, your consent must be obtained to participate. Your consent will be active for this visit and any virtual visit you may have with one of our providers in the next 365 days. If you have a MyChart account, a copy of this consent can be sent to you electronically.  As this is a virtual visit, video technology does not allow for your provider to perform a traditional examination. This may limit your provider's ability to fully assess your condition. If your provider identifies any concerns that need to be evaluated in person or the need to arrange testing (such as labs, EKG, etc.), we will make arrangements to do so. Although advances in technology are sophisticated, we cannot ensure that it will always work on either your end or our end. If the connection with a video visit is poor, the visit may have to be switched to a telephone visit. With either a video or telephone visit, we are not always able to ensure that we have a secure connection.  By engaging in this virtual visit, you consent to the provision of healthcare and authorize for your insurance to be billed (if applicable) for the services provided during this visit. Depending on your insurance coverage, you may receive a charge related to this service.  I need to obtain your verbal consent now. Are you willing to proceed with your visit today? Joshua [name] has provided verbal consent on 11/05/2022 for a virtual visit (video or telephone). Mar Daring, PA-C  Date: 11/05/2022 2:29 PM  Virtual Visit via Video Note   I, Mar Daring, connected with  Joshua [name]  ([id number], Jan 14, 1956) on 11/05/22 at  2:00 PM EST by a video-enabled telemedicine application and verified that I am speaking with the correct person using two identifiers.  Location: Patient: Virtual Visit  Location Patient: Home Provider: Virtual Visit Location Provider: Home Office   I discussed the limitations of evaluation and management by telemedicine and the availability of in person appointments. The patient expressed understanding and agreed to proceed.    History of Present Illness: Joshua [name] is a 66 y.o. who identifies as a male who was assigned male at birth, and is being seen today for worsening ulcer of left foot. He has been followed by Podiatry, Dr. Jacqualyn Posey. Had debrided on 10/25/22. Had ABI on 11/01/22 that was normal. Is currently on Doxycycline 153m BID. Now having increased redness, swelling (reports does have some swelling intermittently at baseline), and pain radiating into the left lower leg to the knee. Does have h/o transmetatarsal amputation in right foot.    Problems:  Patient Active Problem List   Diagnosis Date Noted   Dysphagia 03/16/2022   History of colonic polyps 03/16/2022   Spinal stenosis of lumbar region 11/03/2021   Mixed diabetic hyperlipidemia associated with type 2 diabetes mellitus ([facility] 08/11/2021   Chronic low back pain    Hypokalemia 09/25/2020   Type 2 diabetes mellitus with hyperglycemia, with long-term current use of insulin ([facility] 09/24/2020   Unstable angina ([facility] 08/17/2019   Chest pain 08/16/2019   NSTEMI (non-ST elevated myocardial infarction) ([facility] 04/06/2019   ACS (acute coronary syndrome) ([facility] 04/06/2019   Sepsis ([facility] 03/15/2018   DKA (diabetic ketoacidoses) 03/15/2018   Special screening for malignant neoplasms, colon 08/22/2017   Acute renal failure superimposed on stage 3  chronic kidney disease ([place]) 05/19/2017   Hypertension associated with diabetes ([place])    Status post transmetatarsal amputation of foot, right ([place]) 11/08/2016   Diabetic polyneuropathy associated with type 2 diabetes mellitus ([place]) 11/08/2016   Pure hypercholesterolemia    AKI (acute kidney injury) ([place])    Acute blood loss anemia    DM type 2 with  diabetic peripheral neuropathy [facility])    Physical debility 08/03/2016   Chronic osteomyelitis of right foot (HCC)    Cellulitis and abscess of leg, except foot    Penetrating wound of right foot    CKD (chronic kidney disease) stage 3, GFR 30-59 ml/min (HCC)    Unstable angina pectoris ([place]) 07/18/2016   Chronic combined systolic and diastolic congestive heart failure ([place])    Chest pain with moderate risk for cardiac etiology 06/24/2014   CAD S/P multiple PCIs 06/24/2014   Sleep apnea- on C-pap 06/24/2014   Class 3 severe obesity with serious comorbidity and body mass index (BMI) of 45.0 to 49.9 in adult [facility]) 11/10/2013   Hyperglycemia 10/29/2013   Hyperlipidemia 01/12/2011   Benign essential HTN 01/12/2011   GERD 01/12/2011    Allergies:  Allergies  Allergen Reactions   Chlorthalidone Other (See Comments)    Causes dehydration, kidneys shut down   Medications:  Current Outpatient Medications:    Accu-Chek FastClix Lancets MISC, 1 each by Other route in the morning, at noon, in the evening, and at bedtime. , Disp: , Rfl:    ACCU-CHEK GUIDE test strip, USE UP TO FOUR TIMES DAILY AS DIRECTED (Patient taking differently: 1 each by Other route See admin instructions. USE UP TO FOUR TIMES DAILY AS DIRECTED), Disp: 300 each, Rfl: 3   acetaminophen (TYLENOL) 650 MG CR tablet, Take 1,300 mg by mouth daily as needed for pain., Disp: , Rfl:    allopurinol (ZYLOPRIM) 300 MG tablet, Take 300 mg by mouth daily. , Disp: , Rfl:    aspirin EC 81 MG tablet, Take 81 mg by mouth daily. , Disp: , Rfl:    atorvastatin (LIPITOR) 80 MG tablet, Take 80 mg by mouth daily., Disp: , Rfl:    blood glucose meter kit and supplies, Dispense based on patient and insurance preference. Use up to four times daily as directed. (FOR ICD-10 E10.9, E11.9). (Patient taking differently: 1 each by Other route See admin instructions. Dispense based on patient and insurance preference. Use up to four times daily as directed.  (FOR ICD-10 E10.9, E11.9).), Disp: 1 each, Rfl: 0   Continuous Blood Gluc Sensor (FREESTYLE LIBRE 2 SENSOR) MISC, APPLY TOPICALLY EVERY 14 DAYS, Disp: 2 each, Rfl: 3   cyclobenzaprine (FLEXERIL) 10 MG tablet, Take 10 mg by mouth at bedtime as needed for muscle spasms., Disp: , Rfl:    dapagliflozin propanediol (FARXIGA) 10 MG TABS tablet, Take 10 mg by mouth daily., Disp: , Rfl:    doxycycline (VIBRA-TABS) 100 MG tablet, Take 1 tablet (100 mg total) by mouth 2 (two) times daily., Disp: 14 tablet, Rfl: 0   famotidine (PEPCID) 20 MG tablet, Take 20 mg by mouth at bedtime., Disp: , Rfl:    furosemide (LASIX) 80 MG tablet, Take 1 tablet by mouth twice daily, Disp: 60 tablet, Rfl: 11   gabapentin (NEURONTIN) 100 MG capsule, Take 1 capsule by mouth at bedtime, Disp: 30 capsule, Rfl: 5   HUMALOG KWIKPEN 200 UNIT/ML KwikPen, INJECT 10 TO 18 UNITS SUBCUTANEOUSLY THREE TIMES DAILY BEFORE MEAL(S), Disp: 24 mL, Rfl: 0   isosorbide  mononitrate (IMDUR) 60 MG 24 hr tablet, Take 3 tablets (180 mg total) by mouth daily., Disp: 90 tablet, Rfl: 11   LANTUS SOLOSTAR 100 UNIT/ML Solostar Pen, INJECT 30 UNITS SUBCUTANEOUSLY TWICE DAILY, Disp: 45 mL, Rfl: 0   latanoprost (XALATAN) 0.005 % ophthalmic solution, Place 1 drop into both eyes at bedtime., Disp: , Rfl:    Lidocaine HCl-Benzyl Alcohol (SALONPAS LIDOCAINE PLUS) 4-10 % CREA, Apply 1 patch topically as needed (pain)., Disp: , Rfl:    metoprolol succinate (TOPROL-XL) 25 MG 24 hr tablet, Take 3 tablets by mouth every day, Disp: 90 tablet, Rfl: 11   nitroGLYCERIN (NITROSTAT) 0.4 MG SL tablet, Place 1 tablet (0.4 mg total) under the tongue every 5 (five) minutes as needed for chest pain., Disp: 25 tablet, Rfl: 3   OZEMPIC, 1 MG/DOSE, 4 MG/3ML SOPN, INJECT 1 MG SUBCUTANEOUSLY ONCE A WEEK, Disp: 9 mL, Rfl: 0   pantoprazole (PROTONIX) 40 MG tablet, TAKE 1 TABLET BY MOUTH TWICE DAILY BEFORE MEAL(S), Disp: 180 tablet, Rfl: 0   rosuvastatin (CRESTOR) 20 MG tablet, Take 20 mg  by mouth daily. (Patient not taking: Reported on 09/29/2022), Disp: , Rfl:    silver sulfADIAZINE (SILVADENE) 1 % cream, Apply 1 Application topically daily., Disp: 50 g, Rfl: 0   ticagrelor (BRILINTA) 90 MG TABS tablet, Take 1 tablet by mouth twice daily, Disp: 180 tablet, Rfl: 3   tretinoin (RETIN-A) 0.1 % cream, Apply 1 application. topically daily as needed (skin irritation)., Disp: , Rfl:    Wheat Dextrin (BENEFIBER) CHEW, Chew 1 tablet by mouth daily., Disp: , Rfl:   Observations/Objective: Patient is well-developed, well-nourished in no acute distress.  Resting comfortably at home.  Head is normocephalic, atraumatic.  No labored breathing.  Speech is clear and coherent with logical content.  Patient is alert and oriented at baseline.    Assessment and Plan: 1. Ulcer of left foot with fat layer exposed ([place])  2. Acute pain of left lower extremity  - Suspect worsening infection despite oral antibiotic therapy since ABIs were WNL - Advised to seek in person evaluation at local ER for further evaluation, imaging for osteomyelitis, wound cultures, and possible consideration of IV antibiotics since failing oral; He voiced understanding and agrees  Follow Up Instructions: I discussed the assessment and treatment plan with the patient. The patient was provided an opportunity to ask questions and all were answered. The patient agreed with the plan and demonstrated an understanding of the instructions.  A copy of instructions were sent to the patient via MyChart unless otherwise noted below.    The patient was advised to call back or seek an in-person evaluation if the symptoms worsen or if the condition fails to improve as anticipated.  Time:  I spent 10 minutes with the patient via telehealth technology discussing the above problems/concerns.    Mar Daring, PA-C

## 2022-11-05 NOTE — ED Provider Notes (Signed)
[facility] EMERGENCY DEPARTMENT Provider Note   CSN: [id number] Arrival date & time: 11/05/22  1620     History  Chief Complaint  Patient presents with   Foot Pain    Joshua [name] is a 66 y.o. male with HFpEF, HTN, hyperlipidemia, CAD status post multiple PCI's, obesity, CKD stage III, T2DM, chronic low back pain, chronic osteomyelitis of the right foot s/p transmetatarsal amputation, chronic combined systolic/diastolic heart failure presents with right foot pain.  Patient p/w one week history of worsening left foot pain a/w an ulcer on the bottom of the foot. Patient is followed by podiatry and had a callus cut off a couple weeks ago. He states he has noticed over the last week that there has been increased swelling and increased pain of the area around the ulcer that has slowly begun to extend up his foot and lower leg. He denies any fevers/chills, nausea/vomiting. States he has seen purulent drainage. He had a video appointment with his PCP today who advised him to come to ED. Has a history of osteomyelitis of the right foot requiring a transmetatarsal amputation. Patient has neuropathy of BL feet but has not had any new numbness/tingling.    Foot Pain       Home Medications Prior to Admission medications   Medication Sig Start Date End Date Taking? Authorizing Provider  Accu-Chek FastClix Lancets MISC 1 each by Other route in the morning, at noon, in the evening, and at bedtime.  04/10/19   [provider]  ACCU-CHEK GUIDE test strip USE UP TO FOUR TIMES DAILY AS DIRECTED Patient taking differently: 1 each by Other route See admin instructions. USE UP TO FOUR TIMES DAILY AS DIRECTED 05/15/20   Philemon Kingdom, MD  acetaminophen (TYLENOL) 650 MG CR tablet Take 1,300 mg by mouth daily as needed for pain.    [provider]  allopurinol (ZYLOPRIM) 300 MG tablet Take 300 mg by mouth daily.     [provider]  aspirin EC 81 MG tablet  Take 81 mg by mouth daily.     [provider]  atorvastatin (LIPITOR) 80 MG tablet Take 80 mg by mouth daily. 08/29/22   [provider]  blood glucose meter kit and supplies Dispense based on patient and insurance preference. Use up to four times daily as directed. (FOR ICD-10 E10.9, E11.9). Patient taking differently: 1 each by Other route See admin instructions. Dispense based on patient and insurance preference. Use up to four times daily as directed. (FOR ICD-10 E10.9, E11.9). 04/09/19   Bhagat, Crista Luria, PA  Continuous Blood Gluc Sensor (FREESTYLE LIBRE 2 SENSOR) MISC APPLY TOPICALLY EVERY 14 DAYS 11/01/22   Philemon Kingdom, MD  cyclobenzaprine (FLEXERIL) 10 MG tablet Take 10 mg by mouth at bedtime as needed for muscle spasms.    [provider]  dapagliflozin propanediol (FARXIGA) 10 MG TABS tablet Take 10 mg by mouth daily.    [provider]  doxycycline (VIBRA-TABS) 100 MG tablet Take 1 tablet (100 mg total) by mouth 2 (two) times daily. 10/19/22   Trula Slade, DPM  famotidine (PEPCID) 20 MG tablet Take 20 mg by mouth at bedtime. 11/27/20   [provider]  furosemide (LASIX) 80 MG tablet Take 1 tablet by mouth twice daily 09/30/22   [name], MD  gabapentin (NEURONTIN) 100 MG capsule Take 1 capsule by mouth at bedtime 10/05/22   Star Age, MD  HUMALOG KWIKPEN 200 UNIT/ML KwikPen INJECT 10 TO  18 UNITS SUBCUTANEOUSLY THREE TIMES DAILY BEFORE MEAL(S) 09/29/22   Philemon Kingdom, MD  isosorbide mononitrate (IMDUR) 60 MG 24 hr tablet Take 3 tablets (180 mg total) by mouth daily. 04/06/22   [name], MD  LANTUS SOLOSTAR 100 UNIT/ML Solostar Pen INJECT 30 UNITS SUBCUTANEOUSLY TWICE DAILY 10/18/22   Philemon Kingdom, MD  latanoprost (XALATAN) 0.005 % ophthalmic solution Place 1 drop into both eyes at bedtime.    [provider]  Lidocaine HCl-Benzyl Alcohol (SALONPAS LIDOCAINE PLUS) 4-10 % CREA Apply 1 patch topically  as needed (pain).    [provider]  metoprolol succinate (TOPROL-XL) 25 MG 24 hr tablet Take 3 tablets by mouth every day 09/30/22   [name], MD  nitroGLYCERIN (NITROSTAT) 0.4 MG SL tablet Place 1 tablet (0.4 mg total) under the tongue every 5 (five) minutes as needed for chest pain. 09/29/22   [name], MD  OZEMPIC, 1 MG/DOSE, 4 MG/3ML SOPN INJECT 1 MG SUBCUTANEOUSLY ONCE A WEEK 09/17/22   Philemon Kingdom, MD  pantoprazole (PROTONIX) 40 MG tablet TAKE 1 TABLET BY MOUTH TWICE DAILY BEFORE MEAL(S) 09/19/19   [name], MD  rosuvastatin (CRESTOR) 20 MG tablet Take 20 mg by mouth daily. Patient not taking: Reported on 09/29/2022    [provider]  silver sulfADIAZINE (SILVADENE) 1 % cream Apply 1 Application topically daily. 10/19/22   Trula Slade, DPM  ticagrelor (BRILINTA) 90 MG TABS tablet Take 1 tablet by mouth twice daily 09/30/22   [name], MD  tretinoin (RETIN-A) 0.1 % cream Apply 1 application. topically daily as needed (skin irritation). 12/10/20   [provider]  Wheat Dextrin (BENEFIBER) CHEW Chew 1 tablet by mouth daily.    [provider]      Allergies    Chlorthalidone    Review of Systems   Review of Systems Review of systems negative for f/c.  A 10 point review of systems was performed and is negative unless otherwise reported in HPI.  Physical Exam Updated Vital Signs BP 138/84 (BP Location: Right Arm)   Pulse 90   Temp 98.7 F (37.1 C)   Resp 20   SpO2 97%  Physical Exam General: Normal appearing male, lying in bed.  HEENT: Sclera anicteric, MMM, trachea midline. Cardiology: RRR, no murmurs/rubs/gallops. Resp: Normal respiratory rate and effort. CTAB, no wheezes, rhonchi, crackles.  Abd: Soft, non-tender, non-distended. No rebound tenderness or guarding.  GU: Deferred. MSK: 5x5 cm circular ulcer of the lateral plantar surface of the left foot with surrounding granulation tissue, erythema,  warmth. Purulent drainage noted on bandages. Mild swelling of the foot and TTP of the entire foot extending up to the lower leg. No crepitus or bullae. Soft compartments. Could not palpate DP/PT pulses d/t edema on either side however cap refill is intact and BL feet are warm to touch, L>R.  Skin: warm, dry Neuro: A&Ox4, CNs II-XII grossly intact. MAEs. Sensation grossly intact.  Psych: Normal mood and affect.   ED Results / Procedures / Treatments   Labs (all labs ordered are listed, but only abnormal results are displayed) Labs Reviewed  CBC WITH DIFFERENTIAL/PLATELET - Abnormal; Notable for the following components:      Result Value   WBC 12.5 (*)    Hemoglobin 12.6 (*)    RDW 18.1 (*)    Neutro Abs 9.6 (*)    Monocytes Absolute 1.4 (*)    Abs Immature Granulocytes 0.12 (*)    All other  components within normal limits  BASIC METABOLIC PANEL - Abnormal; Notable for the following components:   Potassium 3.1 (*)    Glucose, Bld 214 (*)    BUN 30 (*)    Creatinine, Ser 2.44 (*)    GFR, Estimated 29 (*)    All other components within normal limits    EKG None  Radiology DG Foot Complete Left  Result Date: 11/05/2022 CLINICAL DATA:  Pain and diabetic foot ulcer EXAM: LEFT FOOT - COMPLETE 3+ VIEW COMPARISON:  None Available. FINDINGS: Chronic avulsion fracture of the base of the fifth metatarsal. There is cortical irregularity with loss of cortical definition on both sides of the fracture fragments about the fracture line. No acute fracture or dislocation. Soft tissue swelling about the fifth metatarsal base with laceration/ulcer about the lateral aspect of the foot at the fifth metatarsal base. Soft tissue swelling about the dorsum of the foot. Degenerative arthritis about the midfoot. IMPRESSION: Findings concerning for osteomyelitis about the chronic avulsion fracture of the fifth metatarsal base. Consider MRI for further evaluation. Adjacent soft tissue ulcer about the fifth  metatarsal with soft tissue swelling about the lateral and dorsal foot. Electronically Signed   By: Placido Sou M.D.   On: 11/05/2022 17:23    Procedures Procedures    Medications Ordered in ED Medications  oxyCODONE-acetaminophen (PERCOCET/ROXICET) 5-325 MG per tablet 1 tablet (1 tablet Oral Given 11/06/22 0602)  oxyCODONE-acetaminophen (PERCOCET/ROXICET) 5-325 MG per tablet 1 tablet (1 tablet Oral Given 11/05/22 1647)  piperacillin-tazobactam (ZOSYN) IVPB 3.375 g (0 g Intravenous Stopped 11/05/22 2312)    ED Course/ Medical Decision Making/ A&P                          Medical Decision Making Amount and/or Complexity of Data Reviewed Labs:  Decision-making details documented in ED Course. Radiology: ordered. Decision-making details documented in ED Course.  Risk Prescription drug management. Decision regarding hospitalization.    Patient is HDS, afebrile at this time. He is non-toxic appearing.   Greatest concern for cellulitis, deep abscess, or osteomyelitis of patient's left foot. Will obtain XRs initially as well as labs. Lower c/f nec fasc given time course of development of infection as well as patient's non-toxic appearance. Compartments are soft, no c/f compartment syndrome. In s/o ulcer with purulent drainage, believe infectious etiology is more likely than DVT.   Labs demonstrate K 3.1, Glucose 214, BUN 30, Cr 2.44 with baseline 2-3. WBC 12.5 with left shift, Hgb 12.6.   XR left foot reads: Findings concerning for osteomyelitis about the chronic avulsion fracture of the fifth metatarsal base. Consider MRI for further evaluation. Adjacent soft tissue ulcer about the fifth metatarsal with soft tissue swelling about the lateral and dorsal foot.  Patient is given IV Vanc/zosyn for c/f osteomyelitis and MRI w/o contrast is ordered. Pt is HDS at this time, oxy for pain control. Consulted to medicine for admission with podiatry following.   Dispo: Admit      I have  personally reviewed and interpreted all labs and imaging. I agree with the radiologist's interpretation. Patient was maintained on a cardiac monitor.  I have personally interpreted the telemetry as NSR.  Clinical Course as of 11/05/22 1957  Fri Nov 05, 2022  1956 DG Foot Complete Left Findings concerning for osteomyelitis about the chronic avulsion fracture of the fifth metatarsal base. Consider MRI for further evaluation. Adjacent soft tissue ulcer about the fifth metatarsal with soft tissue swelling  about the lateral and dorsal foot.   [HN]  1956 WBC(!): 12.5 [HN]    Clinical Course User Index [HN] Audley Hose, MD          Final Clinical Impression(s) / ED Diagnoses Final diagnoses:  Osteomyelitis of left foot, unspecified type [facility])    Rx / DC Orders ED Discharge Orders     None        This note was created using dictation software, which may contain spelling or grammatical errors.    Audley Hose, MD 11/06/22 1430

## 2022-11-05 NOTE — H&P (Signed)
History and Physical    Joshua [name] IDP:[id number] DOB: February 20, 1956 DOA: 11/05/2022  PCP: [name], MD  Patient coming from: Home  I have personally briefly reviewed patient's old medical records in [place]  Chief Complaint: Left foot wound  HPI: Joshua [name] is a 66 y.o. male with medical history significant for HFpEF (EF 55-60%), CAD s/p multiple PCI, CKD stage IV, COPD, T2DM, HTN, HLD, OSA on CPAP who presented to the ED for evaluation of worsening left foot ulcer.  Patient has been dealing with left lateral foot ulcer for the last 3 weeks.  He has been following with podiatry, Dr. Jacqualyn Posey, and has been on doxycycline as an outpatient.  He has been noticing yellow discharge from the foot ulcer as well as increasing pain and some swelling around his foot.  He had a video visit with his primary care team and was advised to come to the ED for further evaluation.  He denies any subjective fevers, chills, diaphoresis, chest pain, dyspnea.  He reports good urine output.  ED Course  Labs/Imaging on admission: I have personally reviewed following labs and imaging studies.  Initial vitals showed BP 138/79, pulse 91, RR 20, temp 98.7 F, SpO2 99% on room air.  Labs show WBC 12.5, hemoglobin 12.6, platelets 272,000, sodium 135, potassium 3.1, bicarb 22, BUN 30, creatinine 2.44, serum glucose 214.  Left foot x-ray shows changes concerning for osteomyelitis about the chronic avulsion fracture of the fifth metatarsal base.  Adjacent soft tissue ulcer about the fifth metatarsal with soft tissue swelling about the lateral and dorsal foot noted.  Patient was given IV vancomycin and Zosyn.  The hospitalist service was consulted to admit for further evaluation and management.  Review of Systems: All systems reviewed and are negative except as documented in history of present illness above.   Past Medical History:  Diagnosis Date   Acute coronary syndrome (HCC)    Anemia     B12 deficiency    Chest pain    CHF (congestive heart failure) (HCC)    Chronic combined systolic and diastolic CHF (congestive heart failure) ([place])    a. 07/2015 Echo: EF 45-50%, mod LVH, mid apical/antsept AK, Gr 1 DD.   Chronic low back pain    CKD (chronic kidney disease), stage III (HCC)    Constipation    COPD (chronic obstructive pulmonary disease) (HCC)    Coronary artery disease    a. 2012 s/p MI/PCI: s/p DES to mid/dist RCA and overlapping DES to LAD;  b. 07/2015 Cath: LAD 10% ISR, D1 40(jailed), LCX 27m, OM2 30, OM3 30, RCA 64m ISR, 10d ISR; c. 07/2016 NSTEMI/PCI: LAD 85ost/p/m (3.5x20 Synergy DES overlapping prior stent), D1 40, LCX 21m, OM2 95 (2.75x16 Synergy DES), OM3 30, RCA 71m, 10d.   Depression    Diabetic gastroparesis ([place])    Diabetic polyneuropathy ([place])    DKA (diabetic ketoacidoses) 03/14/2018   GERD (gastroesophageal reflux disease)    Gout    Heart failure (HCC)    Heart murmur    History of MI (myocardial infarction)    Hyperlipemia    Hypertension    Hypertensive heart disease    Hypokalemia    Ischemic cardiomyopathy    a. 07/2015 Echo: EF 45-50%, mod LVH, mid-apicalanteroseptal AK, Gr1 DD.   Joint pain    Kidney problem    Morbid obesity ([place])    OSA on CPAP    Osteoarthritis    Osteomyelitis ([place])  a. 07/2016 MTP joint of R great toe s/p transmetatarsal amputation.   Other fatigue    Polyneuropathy in diabetes(357.2)    Pulmonary sarcoidosis ([place])    "problems w/it years ago; no problems anymore" (07/03/2014)   Rectal bleeding    Shortness of breath    Shortness of breath on exertion    Sleep apnea    Stomach ulcer    Swallowing difficulty    Type II diabetes mellitus ([place])     Past Surgical History:  Procedure Laterality Date   AMPUTATION Right 07/24/2016   Procedure: TRANSMETATARSAL FOOT AMPUTATION;  Surgeon: [name], MD;  Location: [facility];  Service: Orthopedics;  Laterality: Right;   BALLOON DILATION N/A 03/21/2018   Procedure:  BALLOON DILATION;  Surgeon: [name], MD;  Location: [facility] ENDOSCOPY;  Service: Endoscopy;  Laterality: N/A;   BIOPSY  03/16/2022   Procedure: BIOPSY;  Surgeon: [name], MD;  Location: [facility] ENDOSCOPY;  Service: Endoscopy;;   BREAST LUMPECTOMY Right ~ 2000   "benign"   CARDIAC CATHETERIZATION N/A 07/30/2015   Procedure: Left Heart Cath and Coronary Angiography;  Surgeon: Burnell Blanks, MD;  Location: [place] [facility] CV LAB;  Service: Cardiovascular;  Laterality: N/A;   CARDIAC CATHETERIZATION N/A 07/19/2016   Procedure: Left Heart Cath and Coronary Angiography;  Surgeon: Belva Crome, MD;  Location: [facility] CV LAB;  Service: Cardiovascular;  Laterality: N/A;   CARDIAC CATHETERIZATION N/A 07/29/2016   Procedure: Coronary Stent Intervention;  Surgeon: Belva Crome, MD;  Location: [place] CV LAB;  Service: Cardiovascular;  Laterality: N/A;   CARDIAC CATHETERIZATION N/A 07/29/2016   Procedure: Intravascular Ultrasound/IVUS;  Surgeon: Belva Crome, MD;  Location: [place] CV LAB;  Service: Cardiovascular;  Laterality: N/A;   CATARACT EXTRACTION W/ INTRAOCULAR LENS  IMPLANT, BILATERAL Bilateral 2014-2015   COLONOSCOPY WITH PROPOFOL N/A 08/22/2017   Procedure: COLONOSCOPY WITH PROPOFOL;  Surgeon: [name], MD;  Location: [place];  Service: Endoscopy;  Laterality: N/A;   COLONOSCOPY WITH PROPOFOL N/A 03/16/2022   Procedure: COLONOSCOPY WITH PROPOFOL;  Surgeon: [name], MD;  [facility] ENDOSCOPY;  Service: Endoscopy;  Laterality: N/A;   CORONARY ANGIOPLASTY WITH STENT PLACEMENT  10/31/2011   30 % MID ATRIOVENTRICULAR GROOVE CX STENOSIS. 90 % MID RCA.PTA TO LAD/STENTING OF TANDEM 60-70%. LAD STENOSIS 99% REDUCED TO 0%.3.0 X 32MM PROMUS DES POSTDILATED TO 3.25MM.   CORONARY ANGIOPLASTY WITH STENT PLACEMENT  07/03/2014   "2"   CORONARY STENT INTERVENTION N/A 04/09/2019   Procedure: CORONARY STENT INTERVENTION;  Surgeon: Jettie Booze, MD;  Location: [place] CV LAB;  Service: Cardiovascular;  Laterality: N/A;   CORONARY STENT INTERVENTION N/A 08/22/2019   Procedure: CORONARY STENT INTERVENTION;  Surgeon: Martinique, Peter M, MD;  Location: [place] CV LAB;  Service: Cardiovascular;  Laterality: N/A;   ESOPHAGOGASTRODUODENOSCOPY (EGD) WITH PROPOFOL N/A 03/21/2018   Procedure: ESOPHAGOGASTRODUODENOSCOPY (EGD) WITH PROPOFOL;  Surgeon: [name], MD;  Location: [facility];  Service: Endoscopy;  Laterality: N/A;   ESOPHAGOGASTRODUODENOSCOPY (EGD) WITH PROPOFOL N/A 03/16/2022   Procedure: ESOPHAGOGASTRODUODENOSCOPY (EGD) WITH PROPOFOL;  Surgeon: [name], MD;  [facility] ENDOSCOPY;  Service: Endoscopy;  Laterality: N/A;   I & D EXTREMITY Right 10/30/2013   Procedure: IRRIGATION AND DEBRIDEMENT RIGHT SMALL FINGER POSSIBLE SECONDARY WOUND CLOSURE;  Surgeon: [name], MD;  [facility] ORS;  Service: Orthopedics;  Laterality: Right;  [name] is available after 4pm   I & D EXTREMITY Right 11/01/2013   Procedure:  IRRIGATION AND  DEBRIDEMENT RIGHT  PROXIMAL PHALANGEAL LEVEL AMPUTATION;  Surgeon: [name], MD;  [facility]: [facility] ORS;  Service: Orthopedics;  Laterality: Right;   INCISION AND DRAINAGE Right 10/28/2013   Procedure: INCISION AND DRAINAGE Right small finger;  Surgeon: [name], MD;  [facility] ORS;  Service: Orthopedics;  Laterality: Right;   LEFT HEART CATH Left 11/03/2011   Procedure: LEFT HEART CATH;  Surgeon: [name], MD;  Location: [facility] CATH LAB;  Service: Cardiovascular;  Laterality: Left;   LEFT HEART CATH AND CORONARY ANGIOGRAPHY N/A 04/09/2019   Procedure: LEFT HEART CATH AND CORONARY ANGIOGRAPHY;  Surgeon: Jettie Booze, MD;  Location: [place] CV LAB;  Service: Cardiovascular;  Laterality: N/A;   LEFT HEART CATHETERIZATION WITH CORONARY ANGIOGRAM N/A 07/03/2014   Procedure: LEFT HEART CATHETERIZATION WITH CORONARY ANGIOGRAM;  Surgeon: [name], MD;  Location: [facility] CATH LAB;   Service: Cardiovascular;  Laterality: N/A;   PERCUTANEOUS CORONARY STENT INTERVENTION (PCI-S) Left 11/03/2011   Procedure: PERCUTANEOUS CORONARY STENT INTERVENTION (PCI-S);  Surgeon: [name], MD;  Location: [facility] CATH LAB;  Service: Cardiovascular;  Laterality: Left;   RIGHT/LEFT HEART CATH AND CORONARY ANGIOGRAPHY N/A 08/22/2019   Procedure: RIGHT/LEFT HEART CATH AND CORONARY ANGIOGRAPHY;  Surgeon: Martinique, Peter M, MD;  Location: [place] CV LAB;  Service: Cardiovascular;  Laterality: N/A;   TOE AMPUTATION Right ~ 2010   "2nd toe"   TOE AMPUTATION Right 2012   "3rd toe"    Social History:  reports that he has never smoked. He has never used smokeless tobacco. He reports current alcohol use of about 24.0 standard drinks of alcohol per week. He reports that he does not use drugs.  Allergies  Allergen Reactions   Chlorthalidone Other (See Comments)    Causes dehydration, kidneys shut down    Family History  Problem Relation Age of Onset   Sudden death Mother    Diabetes Maternal Grandmother    Hypertension Maternal Grandmother    Heart attack Maternal Aunt    Sudden death Other      Prior to Admission medications   Medication Sig Start Date End Date Taking? Authorizing Provider  acetaminophen (TYLENOL) 650 MG CR tablet Take 1,300 mg by mouth daily as needed for pain.   Yes [provider]  allopurinol (ZYLOPRIM) 300 MG tablet Take 300 mg by mouth daily.    Yes [provider]  aspirin EC 81 MG tablet Take 81 mg by mouth daily.    Yes [provider]  atorvastatin (LIPITOR) 80 MG tablet Take 80 mg by mouth daily. 08/29/22  Yes [provider]  Continuous Blood Gluc Sensor (FREESTYLE LIBRE 2 SENSOR) MISC APPLY TOPICALLY EVERY 14 DAYS 11/01/22  Yes Philemon Kingdom, MD  cyclobenzaprine (FLEXERIL) 10 MG tablet Take 10 mg by mouth at bedtime as needed for muscle spasms.   Yes [provider]  dapagliflozin propanediol (FARXIGA) 10 MG  TABS tablet Take 10 mg by mouth daily.   Yes [provider]  doxycycline (VIBRA-TABS) 100 MG tablet Take 1 tablet (100 mg total) by mouth 2 (two) times daily. 10/19/22  Yes Trula Slade, DPM  famotidine (PEPCID) 20 MG tablet Take 20 mg by mouth at bedtime. 11/27/20  Yes [provider]  furosemide (LASIX) 80 MG tablet Take 1 tablet by mouth twice daily Patient taking differently: Take 80 mg by mouth 2 (two) times daily. 09/30/22  Yes [name], MD  gabapentin (NEURONTIN) 100 MG capsule Take  1 capsule by mouth at bedtime 10/05/22  Yes Athar, Eunice Blase, MD  HUMALOG KWIKPEN 200 UNIT/ML KwikPen INJECT 10 TO 18 UNITS SUBCUTANEOUSLY THREE TIMES DAILY BEFORE MEAL(S) Patient taking differently: Inject 10-18 Units into the skin 3 (three) times daily before meals. 09/29/22  Yes Philemon Kingdom, MD  isosorbide mononitrate (IMDUR) 60 MG 24 hr tablet Take 3 tablets (180 mg total) by mouth daily. 04/06/22  Yes [name], MD  LANTUS SOLOSTAR 100 UNIT/ML Solostar Pen INJECT 30 UNITS SUBCUTANEOUSLY TWICE DAILY Patient taking differently: Inject 30 Units into the skin 2 (two) times daily. 10/18/22  Yes Philemon Kingdom, MD  latanoprost (XALATAN) 0.005 % ophthalmic solution Place 1 drop into both eyes at bedtime.   Yes [provider]  Lidocaine HCl-Benzyl Alcohol (SALONPAS LIDOCAINE PLUS) 4-10 % CREA Apply 1 patch topically as needed (pain).   Yes [provider]  metoprolol succinate (TOPROL-XL) 25 MG 24 hr tablet Take 3 tablets by mouth every day 09/30/22  Yes [name], MD  nitroGLYCERIN (NITROSTAT) 0.4 MG SL tablet Place 1 tablet (0.4 mg total) under the tongue every 5 (five) minutes as needed for chest pain. 09/29/22  Yes [name], MD  pantoprazole (PROTONIX) 40 MG tablet TAKE 1 TABLET BY MOUTH TWICE DAILY BEFORE MEAL(S) Patient taking differently: Take 40 mg by mouth 2 (two) times daily. 09/19/19  Yes [name], MD  silver sulfADIAZINE  (SILVADENE) 1 % cream Apply 1 Application topically daily. 10/19/22  Yes Trula Slade, DPM  ticagrelor (BRILINTA) 90 MG TABS tablet Take 1 tablet by mouth twice daily Patient taking differently: Take 90 mg by mouth 2 (two) times daily. 09/30/22  Yes [name], MD  OZEMPIC, 1 MG/DOSE, 4 MG/3ML SOPN INJECT 1 MG SUBCUTANEOUSLY ONCE A WEEK Patient taking differently: Inject 1 mg into the skin once a week. Monday 09/17/22   Philemon Kingdom, MD  Wheat Dextrin (BENEFIBER) CHEW Chew 1 tablet by mouth daily as needed (for constipation).    [provider]    Physical Exam: Vitals:   11/05/22 2026 11/05/22 2030 11/05/22 2100 11/05/22 2130  BP: 139/88 (!) 162/99 (!) 153/87 136/82  Pulse: 72 72 89 82  Resp: $Remo'18  17 18  'URUrj$ Temp:      SpO2: 99% 100% 97% 93%  Weight:    128.8 kg   Constitutional: Resting in bed, NAD, calm, comfortable Eyes: EOMI, lids and conjunctivae normal ENMT: Mucous membranes are moist. Posterior pharynx clear of any exudate or lesions.Normal dentition.  Neck: normal, supple, no masses. Respiratory: clear to auscultation bilaterally, no wheezing, no crackles. Normal respiratory effort. No accessory muscle use.  Cardiovascular: Regular rate and rhythm, no murmurs / rubs / gallops.  Trace left lower extremity edema. 2+ pedal pulses. Abdomen: no tenderness, no masses palpated. Musculoskeletal: S/p distal right fifth finger amputation.  No clubbing / cyanosis. No joint deformity upper and lower extremities. Good ROM, no contractures. Normal muscle tone.  Skin: Circular ulceration left lateral foot without active discharge. Neurologic: Sensation intact. Strength 5/5 in all 4.  Psychiatric: Normal judgment and insight. Alert and oriented x 3. Normal mood.    EKG: Not performed. Assessment/Plan Principal Problem:   Osteomyelitis of fifth toe of left foot (HCC) Active Problems:   CAD S/P multiple PCIs   Sleep apnea- on C-pap   Hypertension associated with  diabetes (HCC)   Insulin dependent type 2 diabetes mellitus (HCC)   Hypokalemia   (HFpEF) heart failure with preserved ejection fraction ([place])  CKD (chronic kidney disease), stage IV ([place])   Joshua [name] is a 66 y.o. male with medical history significant for HFpEF (EF 55-60%), CAD s/p multiple PCI, CKD stage IV, COPD, T2DM, HTN, HLD, OSA on CPAP who is admitted with suspected osteomyelitis of the left fifth metatarsal.  Assessment and Plan: * Osteomyelitis of fifth toe of left foot ([place]) X-ray with possible osteomyelitis involving chronic avulsion fracture of the fifth metatarsal base associated with soft tissue ulcer.  Patient is currently hemodynamically stable.  He has been on doxycycline as an outpatient. -Follow MRI left foot -Hold further antibiotics for now -Podiatry consult pending MRI  CKD (chronic kidney disease), stage IV ([place]) Renal function stable.  Continue to monitor.  (HFpEF) heart failure with preserved ejection fraction (HCC) Stable and compensated. -Continue home Lasix, Toprol-XL, Farxiga  Hypokalemia Oral supplement given.  Insulin dependent type 2 diabetes mellitus (HCC) Continue Semglee 20 units twice daily, SSI, Farxiga.  Hypertension associated with diabetes ([place]) Stable.  Continue home Toprol-XL, Imdur.  Sleep apnea- on C-pap Continue CPAP nightly.  CAD S/P multiple PCIs Stable, denies any chest pain. -Continue aspirin, Brilinta, statin, Imdur  DVT prophylaxis: heparin injection 5,000 Units Start: 11/06/22 0115 Code Status: Full code, confirmed with patient on admission Family Communication: Discussed with patient, he has discussed with family Disposition Plan: From home and likely discharge to home pending clinical progress Consults called: None Severity of Illness: The appropriate patient status for this patient is OBSERVATION. Observation status is judged to be reasonable and necessary in order to provide the required intensity of service  to ensure the patient's safety. The patient's presenting symptoms, physical exam findings, and initial radiographic and laboratory data in the context of their medical condition is felt to place them at decreased risk for further clinical deterioration. Furthermore, it is anticipated that the patient will be medically stable for discharge from the hospital within 2 midnights of admission.   Zada Finders MD [facility]  If 7PM-7AM, please contact night-coverage [email or web address]  11/06/2022, 1:13 AM

## 2022-11-05 NOTE — Progress Notes (Signed)
Pharmacy Antibiotic Note  Joshua [name] is a 66 y.o. male admitted on 11/05/2022 with  c/f osteomyelitis . Xray of the L foot shows: "osteomyelitis about the chronic avulsion fracture of the fifth metatarsal base. Pharmacy has been consulted for vancomycin and zosyn dosing.  WBC 12.5/afeb.  Plan: Vancomycin $RemoveBeforeDE'2500mg'[id number]$  IV load followed by vancomycin $RemoveBefor'1000mg'rNKvxjaSnVZL$  IV q12h  -vanc levels prn per protocol - goal AUC 400-550; predicted AUC 409  Zosyn 3.375g IV q8h F/u renal func, culture data, imaging studies, LOT     Temp (24hrs), Avg:98.9 F (37.2 C), Min:98.7 F (37.1 C), Max:99 F (37.2 C)  Recent Labs  Lab 11/05/22 1643  WBC 12.5*  CREATININE 2.44*    CrCl cannot be calculated (Unknown ideal weight.).    Allergies  Allergen Reactions   Chlorthalidone Other (See Comments)    Causes dehydration, kidneys shut down    Antimicrobials this admission: Vanc 11/24> Zosyn 11/24>   Dose adjustments this admission:   Microbiology results:   Thank you for allowing pharmacy to be a part of this patient's care.  Wilson Singer, PharmD Clinical Pharmacist 11/05/2022 9:49 PM

## 2022-11-05 NOTE — ED Notes (Signed)
Vancomycin  not available until 0045 according to the pharmacist  who

## 2022-11-05 NOTE — ED Notes (Signed)
Unable to get an iv started after one attempt  the pt reports that everyone has difficulty  iv team requested

## 2022-11-05 NOTE — ED Provider Triage Note (Signed)
Emergency Medicine Provider Triage Evaluation Note  Joshua [name] , a 66 y.o. male  was evaluated in triage.  Pt complains of diabetic foot ulcer.  Symptoms started about 3 weeks ago.  He has seen podiatry.  He has had increased pain and drainage from the ulceration with a foul odor.  Patient currently living in his car.  Reports several medications for diabetes including insulin.  History of right transmetatarsal amputation 5 years ago.   Review of Systems  Positive: Foot ulcer, foot pain Negative: Fever  Physical Exam  BP (!) 154/70 (BP Location: Left Arm)   Pulse (!) 102   Temp 99 F (37.2 C)   Resp 18   SpO2 97%  Gen:   Awake, no distress   Resp:  Normal effort  MSK:   Moves extremities without difficulty  Other:    Medical Decision Making  Medically screening exam initiated at 4:43 PM.  Appropriate orders placed.  Joshua [name] was informed that the remainder of the evaluation will be completed by another provider, this initial triage assessment does not replace that evaluation, and the importance of remaining in the ED until their evaluation is complete.     Carlisle Cater, PA-C 11/05/22 1645

## 2022-11-05 NOTE — Patient Instructions (Signed)
Thana Ates, thank you for joining Mar Daring, PA-C for today's virtual visit.  While this provider is not your primary care provider (PCP), if your PCP is located in our provider database this encounter information will be shared with them immediately following your visit.   B and E account gives you access to today's visit and all your visits, tests, and labs performed at [facility] " click here if you don't have a [facility] account or go to [email or web address]  Consent: (Patient) Joshua [name] provided verbal consent for this virtual visit at the beginning of the encounter.  Current Medications:  Current Outpatient Medications:    Accu-Chek FastClix Lancets MISC, 1 each by Other route in the morning, at noon, in the evening, and at bedtime. , Disp: , Rfl:    ACCU-CHEK GUIDE test strip, USE UP TO FOUR TIMES DAILY AS DIRECTED (Patient taking differently: 1 each by Other route See admin instructions. USE UP TO FOUR TIMES DAILY AS DIRECTED), Disp: 300 each, Rfl: 3   acetaminophen (TYLENOL) 650 MG CR tablet, Take 1,300 mg by mouth daily as needed for pain., Disp: , Rfl:    allopurinol (ZYLOPRIM) 300 MG tablet, Take 300 mg by mouth daily. , Disp: , Rfl:    aspirin EC 81 MG tablet, Take 81 mg by mouth daily. , Disp: , Rfl:    atorvastatin (LIPITOR) 80 MG tablet, Take 80 mg by mouth daily., Disp: , Rfl:    blood glucose meter kit and supplies, Dispense based on patient and insurance preference. Use up to four times daily as directed. (FOR ICD-10 E10.9, E11.9). (Patient taking differently: 1 each by Other route See admin instructions. Dispense based on patient and insurance preference. Use up to four times daily as directed. (FOR ICD-10 E10.9, E11.9).), Disp: 1 each, Rfl: 0   Continuous Blood Gluc Sensor (FREESTYLE LIBRE 2 SENSOR) MISC, APPLY TOPICALLY EVERY 14 DAYS, Disp: 2 each, Rfl: 3   cyclobenzaprine (FLEXERIL) 10 MG tablet, Take 10 mg by  mouth at bedtime as needed for muscle spasms., Disp: , Rfl:    dapagliflozin propanediol (FARXIGA) 10 MG TABS tablet, Take 10 mg by mouth daily., Disp: , Rfl:    doxycycline (VIBRA-TABS) 100 MG tablet, Take 1 tablet (100 mg total) by mouth 2 (two) times daily., Disp: 14 tablet, Rfl: 0   famotidine (PEPCID) 20 MG tablet, Take 20 mg by mouth at bedtime., Disp: , Rfl:    furosemide (LASIX) 80 MG tablet, Take 1 tablet by mouth twice daily, Disp: 60 tablet, Rfl: 11   gabapentin (NEURONTIN) 100 MG capsule, Take 1 capsule by mouth at bedtime, Disp: 30 capsule, Rfl: 5   HUMALOG KWIKPEN 200 UNIT/ML KwikPen, INJECT 10 TO 18 UNITS SUBCUTANEOUSLY THREE TIMES DAILY BEFORE MEAL(S), Disp: 24 mL, Rfl: 0   isosorbide mononitrate (IMDUR) 60 MG 24 hr tablet, Take 3 tablets (180 mg total) by mouth daily., Disp: 90 tablet, Rfl: 11   LANTUS SOLOSTAR 100 UNIT/ML Solostar Pen, INJECT 30 UNITS SUBCUTANEOUSLY TWICE DAILY, Disp: 45 mL, Rfl: 0   latanoprost (XALATAN) 0.005 % ophthalmic solution, Place 1 drop into both eyes at bedtime., Disp: , Rfl:    Lidocaine HCl-Benzyl Alcohol (SALONPAS LIDOCAINE PLUS) 4-10 % CREA, Apply 1 patch topically as needed (pain)., Disp: , Rfl:    metoprolol succinate (TOPROL-XL) 25 MG 24 hr tablet, Take 3 tablets by mouth every day, Disp: 90 tablet, Rfl: 11   nitroGLYCERIN (NITROSTAT) 0.4 MG SL tablet, Place 1  tablet (0.4 mg total) under the tongue every 5 (five) minutes as needed for chest pain., Disp: 25 tablet, Rfl: 3   OZEMPIC, 1 MG/DOSE, 4 MG/3ML SOPN, INJECT 1 MG SUBCUTANEOUSLY ONCE A WEEK, Disp: 9 mL, Rfl: 0   pantoprazole (PROTONIX) 40 MG tablet, TAKE 1 TABLET BY MOUTH TWICE DAILY BEFORE MEAL(S), Disp: 180 tablet, Rfl: 0   rosuvastatin (CRESTOR) 20 MG tablet, Take 20 mg by mouth daily. (Patient not taking: Reported on 09/29/2022), Disp: , Rfl:    silver sulfADIAZINE (SILVADENE) 1 % cream, Apply 1 Application topically daily., Disp: 50 g, Rfl: 0   ticagrelor (BRILINTA) 90 MG TABS tablet,  Take 1 tablet by mouth twice daily, Disp: 180 tablet, Rfl: 3   tretinoin (RETIN-A) 0.1 % cream, Apply 1 application. topically daily as needed (skin irritation)., Disp: , Rfl:    Wheat Dextrin (BENEFIBER) CHEW, Chew 1 tablet by mouth daily., Disp: , Rfl:    Medications ordered in this encounter:  No orders of the defined types were placed in this encounter.    *If you need refills on other medications prior to your next appointment, please contact your pharmacy*  Follow-Up: Call back or seek an in-person evaluation if the symptoms worsen or if the condition fails to improve as anticipated.  [facility] [phone]  Other Instructions Seek in person evaluation at local ER   If you have been instructed to have an in-person evaluation today at a local Urgent Care facility, please use the link below. It will take you to a list of all of our available [facility], including address, phone number and hours of operation. Please do not delay care.  [facility]  If you or a family member do not have a primary care provider, use the link below to schedule a visit and establish care. When you choose a [place] [facility] primary care physician or advanced practice provider, you gain a long-term partner in health. Find a Primary Care Provider  Learn more about [facility]'s in-office and virtual care options: [facility]

## 2022-11-06 ENCOUNTER — Encounter (HOSPITAL_COMMUNITY): Payer: Self-pay | Admitting: Internal Medicine

## 2022-11-06 ENCOUNTER — Inpatient Hospital Stay (HOSPITAL_COMMUNITY): Payer: Medicare Other

## 2022-11-06 ENCOUNTER — Encounter (HOSPITAL_COMMUNITY): Payer: Medicare Other

## 2022-11-06 ENCOUNTER — Observation Stay (HOSPITAL_COMMUNITY): Payer: Medicare Other

## 2022-11-06 DIAGNOSIS — Z7984 Long term (current) use of oral hypoglycemic drugs: Secondary | ICD-10-CM | POA: Diagnosis not present

## 2022-11-06 DIAGNOSIS — L0390 Cellulitis, unspecified: Secondary | ICD-10-CM | POA: Diagnosis not present

## 2022-11-06 DIAGNOSIS — Z6841 Body Mass Index (BMI) 40.0 and over, adult: Secondary | ICD-10-CM | POA: Diagnosis not present

## 2022-11-06 DIAGNOSIS — I252 Old myocardial infarction: Secondary | ICD-10-CM | POA: Diagnosis not present

## 2022-11-06 DIAGNOSIS — E876 Hypokalemia: Secondary | ICD-10-CM | POA: Diagnosis present

## 2022-11-06 DIAGNOSIS — L089 Local infection of the skin and subcutaneous tissue, unspecified: Secondary | ICD-10-CM

## 2022-11-06 DIAGNOSIS — E1159 Type 2 diabetes mellitus with other circulatory complications: Secondary | ICD-10-CM | POA: Diagnosis not present

## 2022-11-06 DIAGNOSIS — Z5901 Sheltered homelessness: Secondary | ICD-10-CM | POA: Diagnosis not present

## 2022-11-06 DIAGNOSIS — J449 Chronic obstructive pulmonary disease, unspecified: Secondary | ICD-10-CM | POA: Diagnosis present

## 2022-11-06 DIAGNOSIS — E119 Type 2 diabetes mellitus without complications: Secondary | ICD-10-CM | POA: Diagnosis not present

## 2022-11-06 DIAGNOSIS — L97529 Non-pressure chronic ulcer of other part of left foot with unspecified severity: Secondary | ICD-10-CM | POA: Diagnosis not present

## 2022-11-06 DIAGNOSIS — E1169 Type 2 diabetes mellitus with other specified complication: Secondary | ICD-10-CM | POA: Diagnosis present

## 2022-11-06 DIAGNOSIS — I96 Gangrene, not elsewhere classified: Secondary | ICD-10-CM | POA: Diagnosis not present

## 2022-11-06 DIAGNOSIS — I509 Heart failure, unspecified: Secondary | ICD-10-CM | POA: Diagnosis not present

## 2022-11-06 DIAGNOSIS — M869 Osteomyelitis, unspecified: Secondary | ICD-10-CM | POA: Diagnosis present

## 2022-11-06 DIAGNOSIS — D631 Anemia in chronic kidney disease: Secondary | ICD-10-CM | POA: Diagnosis present

## 2022-11-06 DIAGNOSIS — I5032 Chronic diastolic (congestive) heart failure: Secondary | ICD-10-CM | POA: Diagnosis present

## 2022-11-06 DIAGNOSIS — E43 Unspecified severe protein-calorie malnutrition: Secondary | ICD-10-CM | POA: Diagnosis present

## 2022-11-06 DIAGNOSIS — T8131XA Disruption of external operation (surgical) wound, not elsewhere classified, initial encounter: Secondary | ICD-10-CM

## 2022-11-06 DIAGNOSIS — E11628 Type 2 diabetes mellitus with other skin complications: Secondary | ICD-10-CM | POA: Diagnosis present

## 2022-11-06 DIAGNOSIS — Z794 Long term (current) use of insulin: Secondary | ICD-10-CM | POA: Diagnosis not present

## 2022-11-06 DIAGNOSIS — I152 Hypertension secondary to endocrine disorders: Secondary | ICD-10-CM | POA: Diagnosis present

## 2022-11-06 DIAGNOSIS — B964 Proteus (mirabilis) (morganii) as the cause of diseases classified elsewhere: Secondary | ICD-10-CM | POA: Diagnosis present

## 2022-11-06 DIAGNOSIS — X58XXXA Exposure to other specified factors, initial encounter: Secondary | ICD-10-CM | POA: Diagnosis present

## 2022-11-06 DIAGNOSIS — N184 Chronic kidney disease, stage 4 (severe): Secondary | ICD-10-CM | POA: Diagnosis present

## 2022-11-06 DIAGNOSIS — E1122 Type 2 diabetes mellitus with diabetic chronic kidney disease: Secondary | ICD-10-CM | POA: Diagnosis present

## 2022-11-06 DIAGNOSIS — L97429 Non-pressure chronic ulcer of left heel and midfoot with unspecified severity: Secondary | ICD-10-CM | POA: Diagnosis present

## 2022-11-06 DIAGNOSIS — M868X7 Other osteomyelitis, ankle and foot: Secondary | ICD-10-CM | POA: Diagnosis not present

## 2022-11-06 DIAGNOSIS — E785 Hyperlipidemia, unspecified: Secondary | ICD-10-CM | POA: Diagnosis present

## 2022-11-06 DIAGNOSIS — M19072 Primary osteoarthritis, left ankle and foot: Secondary | ICD-10-CM | POA: Diagnosis not present

## 2022-11-06 DIAGNOSIS — E1143 Type 2 diabetes mellitus with diabetic autonomic (poly)neuropathy: Secondary | ICD-10-CM | POA: Diagnosis present

## 2022-11-06 DIAGNOSIS — E11621 Type 2 diabetes mellitus with foot ulcer: Secondary | ICD-10-CM | POA: Diagnosis present

## 2022-11-06 DIAGNOSIS — T8189XA Other complications of procedures, not elsewhere classified, initial encounter: Secondary | ICD-10-CM | POA: Diagnosis not present

## 2022-11-06 DIAGNOSIS — D72829 Elevated white blood cell count, unspecified: Secondary | ICD-10-CM | POA: Diagnosis present

## 2022-11-06 DIAGNOSIS — E1142 Type 2 diabetes mellitus with diabetic polyneuropathy: Secondary | ICD-10-CM | POA: Diagnosis present

## 2022-11-06 DIAGNOSIS — I110 Hypertensive heart disease with heart failure: Secondary | ICD-10-CM | POA: Diagnosis not present

## 2022-11-06 DIAGNOSIS — E1165 Type 2 diabetes mellitus with hyperglycemia: Secondary | ICD-10-CM | POA: Diagnosis present

## 2022-11-06 DIAGNOSIS — I2510 Atherosclerotic heart disease of native coronary artery without angina pectoris: Secondary | ICD-10-CM | POA: Diagnosis not present

## 2022-11-06 DIAGNOSIS — Z89431 Acquired absence of right foot: Secondary | ICD-10-CM | POA: Diagnosis not present

## 2022-11-06 DIAGNOSIS — S91302A Unspecified open wound, left foot, initial encounter: Secondary | ICD-10-CM | POA: Diagnosis not present

## 2022-11-06 DIAGNOSIS — M86172 Other acute osteomyelitis, left ankle and foot: Secondary | ICD-10-CM | POA: Diagnosis not present

## 2022-11-06 LAB — BASIC METABOLIC PANEL
Anion gap: 12 (ref 5–15)
BUN: 29 mg/dL — ABNORMAL HIGH (ref 8–23)
CO2: 21 mmol/L — ABNORMAL LOW (ref 22–32)
Calcium: 8.6 mg/dL — ABNORMAL LOW (ref 8.9–10.3)
Chloride: 103 mmol/L (ref 98–111)
Creatinine, Ser: 2.21 mg/dL — ABNORMAL HIGH (ref 0.61–1.24)
GFR, Estimated: 32 mL/min — ABNORMAL LOW (ref >60)
Glucose, Bld: 101 mg/dL — ABNORMAL HIGH (ref 70–99)
Potassium: 2.9 mmol/L — ABNORMAL LOW (ref 3.5–5.1)
Sodium: 136 mmol/L (ref 135–145)

## 2022-11-06 LAB — CBC
HCT: 38.6 % — ABNORMAL LOW (ref 39.0–52.0)
Hemoglobin: 12.1 g/dL — ABNORMAL LOW (ref 13.0–17.0)
MCH: 27.6 pg (ref 26.0–34.0)
MCHC: 31.3 g/dL (ref 30.0–36.0)
MCV: 87.9 fL (ref 80.0–100.0)
Platelets: 245 10*3/uL (ref 150–400)
RBC: 4.39 MIL/uL (ref 4.22–5.81)
RDW: 18 % — ABNORMAL HIGH (ref 11.5–15.5)
WBC: 9 10*3/uL (ref 4.0–10.5)
nRBC: 0 % (ref 0.0–0.2)

## 2022-11-06 LAB — CBG MONITORING, ED
Glucose-Capillary: 86 mg/dL (ref 70–99)
Glucose-Capillary: 126 mg/dL — ABNORMAL HIGH (ref 70–99)

## 2022-11-06 LAB — MAGNESIUM: Magnesium: 2.1 mg/dL (ref 1.7–2.4)

## 2022-11-06 LAB — SEDIMENTATION RATE: Sed Rate: 105 mm/hr — ABNORMAL HIGH (ref 0–16)

## 2022-11-06 LAB — HIV ANTIBODY (ROUTINE TESTING W REFLEX): HIV Screen 4th Generation wRfx: NONREACTIVE

## 2022-11-06 LAB — GLUCOSE, CAPILLARY
Glucose-Capillary: 148 mg/dL — ABNORMAL HIGH (ref 70–99)
Glucose-Capillary: 161 mg/dL — ABNORMAL HIGH (ref 70–99)

## 2022-11-06 LAB — C-REACTIVE PROTEIN: CRP: 13.1 mg/dL — ABNORMAL HIGH (ref <1.0)

## 2022-11-06 LAB — SURGICAL PCR SCREEN
MRSA, PCR: NEGATIVE
Staphylococcus aureus: NEGATIVE

## 2022-11-06 MED ORDER — ACETAMINOPHEN 325 MG PO TABS
650.0000 mg | ORAL_TABLET | Freq: Four times a day (QID) | ORAL | Status: DC | PRN
  Administered 2022-11-09: 650 mg via ORAL
  Filled 2022-11-06 (×2): qty 2

## 2022-11-06 MED ORDER — OXYCODONE-ACETAMINOPHEN 5-325 MG PO TABS
1.0000 | ORAL_TABLET | Freq: Four times a day (QID) | ORAL | Status: DC | PRN
  Administered 2022-11-06 – 2022-11-15 (×13): 1 via ORAL
  Filled 2022-11-06 (×13): qty 1

## 2022-11-06 MED ORDER — METOPROLOL SUCCINATE ER 50 MG PO TB24
75.0000 mg | ORAL_TABLET | Freq: Every day | ORAL | Status: DC
  Administered 2022-11-06 – 2022-11-16 (×11): 75 mg via ORAL
  Filled 2022-11-06 (×9): qty 1
  Filled 2022-11-06: qty 3
  Filled 2022-11-06: qty 1

## 2022-11-06 MED ORDER — ASPIRIN 81 MG PO TBEC
81.0000 mg | DELAYED_RELEASE_TABLET | Freq: Every day | ORAL | Status: DC
  Administered 2022-11-06 – 2022-11-16 (×11): 81 mg via ORAL
  Filled 2022-11-06 (×11): qty 1

## 2022-11-06 MED ORDER — HEPARIN SODIUM (PORCINE) 5000 UNIT/ML IJ SOLN
5000.0000 [IU] | Freq: Three times a day (TID) | INTRAMUSCULAR | Status: DC
  Administered 2022-11-06 – 2022-11-16 (×31): 5000 [IU] via SUBCUTANEOUS
  Filled 2022-11-06 (×31): qty 1

## 2022-11-06 MED ORDER — POTASSIUM CHLORIDE CRYS ER 20 MEQ PO TBCR
40.0000 meq | EXTENDED_RELEASE_TABLET | ORAL | Status: AC
  Administered 2022-11-06 (×2): 40 meq via ORAL
  Filled 2022-11-06 (×2): qty 2

## 2022-11-06 MED ORDER — INSULIN ASPART 100 UNIT/ML IJ SOLN
0.0000 [IU] | Freq: Every day | INTRAMUSCULAR | Status: DC
  Administered 2022-11-07: 2 [IU] via SUBCUTANEOUS
  Administered 2022-11-08 – 2022-11-10 (×3): 3 [IU] via SUBCUTANEOUS
  Administered 2022-11-12: 5 [IU] via SUBCUTANEOUS

## 2022-11-06 MED ORDER — ACETAMINOPHEN 650 MG RE SUPP: 650.0000 mg | Freq: Four times a day (QID) | RECTAL | Status: DC | PRN

## 2022-11-06 MED ORDER — ONDANSETRON HCL 4 MG/2ML IJ SOLN: 4.0000 mg | Freq: Four times a day (QID) | INTRAMUSCULAR | Status: DC | PRN

## 2022-11-06 MED ORDER — ADULT MULTIVITAMIN W/MINERALS CH
1.0000 | ORAL_TABLET | Freq: Every day | ORAL | Status: DC
  Administered 2022-11-06 – 2022-11-16 (×11): 1 via ORAL
  Filled 2022-11-06 (×11): qty 1

## 2022-11-06 MED ORDER — GABAPENTIN 100 MG PO CAPS
100.0000 mg | ORAL_CAPSULE | Freq: Every day | ORAL | Status: DC
  Administered 2022-11-06 – 2022-11-15 (×11): 100 mg via ORAL
  Filled 2022-11-06 (×11): qty 1

## 2022-11-06 MED ORDER — PANTOPRAZOLE SODIUM 40 MG PO TBEC
40.0000 mg | DELAYED_RELEASE_TABLET | Freq: Two times a day (BID) | ORAL | Status: DC
  Administered 2022-11-06 – 2022-11-16 (×22): 40 mg via ORAL
  Filled 2022-11-06 (×22): qty 1

## 2022-11-06 MED ORDER — INSULIN ASPART 100 UNIT/ML IJ SOLN
0.0000 [IU] | Freq: Three times a day (TID) | INTRAMUSCULAR | Status: DC
  Administered 2022-11-06 (×2): 3 [IU] via SUBCUTANEOUS
  Administered 2022-11-07: 4 [IU] via SUBCUTANEOUS
  Administered 2022-11-07: 3 [IU] via SUBCUTANEOUS
  Administered 2022-11-08: 7 [IU] via SUBCUTANEOUS
  Administered 2022-11-08: 4 [IU] via SUBCUTANEOUS
  Administered 2022-11-08: 7 [IU] via SUBCUTANEOUS
  Administered 2022-11-09: 4 [IU] via SUBCUTANEOUS
  Administered 2022-11-09: 11 [IU] via SUBCUTANEOUS
  Administered 2022-11-09 – 2022-11-10 (×2): 7 [IU] via SUBCUTANEOUS
  Administered 2022-11-10 (×2): 4 [IU] via SUBCUTANEOUS
  Administered 2022-11-11: 7 [IU] via SUBCUTANEOUS
  Administered 2022-11-11 – 2022-11-12 (×3): 4 [IU] via SUBCUTANEOUS
  Administered 2022-11-12: 7 [IU] via SUBCUTANEOUS
  Administered 2022-11-12 – 2022-11-14 (×5): 4 [IU] via SUBCUTANEOUS
  Administered 2022-11-14: 7 [IU] via SUBCUTANEOUS
  Administered 2022-11-14: 3 [IU] via SUBCUTANEOUS
  Administered 2022-11-15: 7 [IU] via SUBCUTANEOUS
  Administered 2022-11-15 – 2022-11-16 (×3): 4 [IU] via SUBCUTANEOUS

## 2022-11-06 MED ORDER — FUROSEMIDE 40 MG PO TABS
80.0000 mg | ORAL_TABLET | Freq: Two times a day (BID) | ORAL | Status: DC
  Administered 2022-11-06 – 2022-11-07 (×4): 80 mg via ORAL
  Filled 2022-11-06 (×3): qty 2
  Filled 2022-11-06: qty 4

## 2022-11-06 MED ORDER — SENNOSIDES-DOCUSATE SODIUM 8.6-50 MG PO TABS
1.0000 | ORAL_TABLET | Freq: Every evening | ORAL | Status: DC | PRN
  Administered 2022-11-07 – 2022-11-11 (×2): 1 via ORAL
  Filled 2022-11-06 (×3): qty 1

## 2022-11-06 MED ORDER — INSULIN GLARGINE-YFGN 100 UNIT/ML ~~LOC~~ SOLN
30.0000 [IU] | Freq: Two times a day (BID) | SUBCUTANEOUS | Status: DC
  Filled 2022-11-06 (×3): qty 0.3

## 2022-11-06 MED ORDER — TICAGRELOR 90 MG PO TABS
90.0000 mg | ORAL_TABLET | Freq: Two times a day (BID) | ORAL | Status: DC
  Administered 2022-11-06 – 2022-11-16 (×21): 90 mg via ORAL
  Filled 2022-11-06 (×21): qty 1

## 2022-11-06 MED ORDER — GADOBUTROL 1 MMOL/ML IV SOLN
10.0000 mL | Freq: Once | INTRAVENOUS | Status: AC | PRN
  Administered 2022-11-06: 10 mL via INTRAVENOUS

## 2022-11-06 MED ORDER — MUPIROCIN 2 % EX OINT
1.0000 | TOPICAL_OINTMENT | Freq: Two times a day (BID) | CUTANEOUS | Status: AC
  Administered 2022-11-06 – 2022-11-11 (×10): 1 via NASAL
  Filled 2022-11-06 (×2): qty 22

## 2022-11-06 MED ORDER — ONDANSETRON HCL 4 MG PO TABS: 4.0000 mg | ORAL_TABLET | Freq: Four times a day (QID) | ORAL | Status: DC | PRN

## 2022-11-06 MED ORDER — DAPAGLIFLOZIN PROPANEDIOL 10 MG PO TABS
10.0000 mg | ORAL_TABLET | Freq: Every day | ORAL | Status: DC
  Administered 2022-11-06 – 2022-11-16 (×11): 10 mg via ORAL
  Filled 2022-11-06 (×11): qty 1

## 2022-11-06 MED ORDER — ATORVASTATIN CALCIUM 80 MG PO TABS
80.0000 mg | ORAL_TABLET | Freq: Every day | ORAL | Status: DC
  Administered 2022-11-06 – 2022-11-16 (×11): 80 mg via ORAL
  Filled 2022-11-06 (×8): qty 1
  Filled 2022-11-06: qty 2
  Filled 2022-11-06 (×2): qty 1

## 2022-11-06 MED ORDER — ISOSORBIDE MONONITRATE ER 60 MG PO TB24
180.0000 mg | ORAL_TABLET | Freq: Every day | ORAL | Status: DC
  Administered 2022-11-06 – 2022-11-16 (×11): 180 mg via ORAL
  Filled 2022-11-06 (×3): qty 3
  Filled 2022-11-06: qty 6
  Filled 2022-11-06 (×2): qty 3
  Filled 2022-11-06: qty 6
  Filled 2022-11-06 (×5): qty 3

## 2022-11-06 MED ORDER — VITAMIN C 500 MG PO TABS
500.0000 mg | ORAL_TABLET | Freq: Two times a day (BID) | ORAL | Status: DC
  Administered 2022-11-06 – 2022-11-16 (×21): 500 mg via ORAL
  Filled 2022-11-06 (×21): qty 1

## 2022-11-06 MED ORDER — JUVEN PO PACK
1.0000 | PACK | Freq: Two times a day (BID) | ORAL | Status: DC
  Administered 2022-11-06 – 2022-11-16 (×20): 1 via ORAL
  Filled 2022-11-06 (×21): qty 1

## 2022-11-06 NOTE — ED Notes (Signed)
Pt sitting on side of bed eating breakfast, denies any further needs during this time. Call bell within reach.

## 2022-11-06 NOTE — Consult Note (Signed)
[facility] for Infectious Disease    Date of Admission:  11/05/2022     Reason for Consult: diabetic foot infection     Referring Provider: Wynelle Cleveland    Abx: 11/24 vanc/piptazo        Assessment: 66 yo male basically homeless/poor living situation, dm2, ckd4, cad, admitted 11/24 for chronic left foot ulcer with osteomyelitis  Mri imaging 5th mt osteomyelitis, however this would need to be confirmed by a pathologic evaluation as false positive read rate is high  He appears clinically well  Will see what podiatry plans. And I agree with Dr Wynelle Cleveland if nothing to be done surgically we could either follow crp/esr a little longer or trial of doxy/augmentin for 4 weeks empirically presuming this is active infection    Plan: Abi ordered, to evaluate vascualar supply status Esr/crp Await podiatry evaluation See above regarding potential plan for abx He has ID clinic appointment f/u with me on 12/7 @ 345pm Discussed with primary team   I spent 75 minute reviewing data/chart, and coordinating care and >50% direct face to face time providing counseling/discussing diagnostics/treatment plan with patient   ------------------------------------------------ Principal Problem:   Osteomyelitis of fifth toe of left foot ([place]) Active Problems:   CAD S/P multiple PCIs   Sleep apnea- on C-pap   Hypertension associated with diabetes ([place])   Insulin dependent type 2 diabetes mellitus ([place])   Hypokalemia   (HFpEF) heart failure with preserved ejection fraction (HCC)   CKD (chronic kidney disease), stage IV ([place])    HPI: Joshua [name] is a 66 y.o. male homeless, hx right tma several years prior, here with left foot lateral ulcer  He developed this about 4 weeks ago. At first it appears like a bump/callous with some mild pain and swelling  He went to dr Earleen Newport and had a callous shave and was placed on doxycycline. He is still taking doxy for almost 2 weeks  He has no  fever/chill His cbg at home has been actually low His appetite is intact  He has a telehealth the day of admission and he reports some redness in the foot. There has been mild fibrinous discharge as well. Again no fc  He was told to go to ed  Afebrile here no leukocytosis and mri with suggestion of 5th mt om; no abscess  He was given vanc/piptazo Bcx ngtd Podiatry evaluating to see if any surgical intervention needed    Family History  Problem Relation Age of Onset   Sudden death Mother    Diabetes Maternal Grandmother    Hypertension Maternal Grandmother    Heart attack Maternal Aunt    Sudden death Other     Social History   Tobacco Use   Smoking status: Never   Smokeless tobacco: Never  Vaping Use   Vaping Use: Never used  Substance Use Topics   Alcohol use: Yes    Alcohol/week: 24.0 standard drinks of alcohol    Types: 24 Cans of beer per week    Comment: social    Drug use: No    Allergies  Allergen Reactions   Chlorthalidone Other (See Comments)    Causes dehydration, kidneys shut down    Review of Systems: ROS All Other ROS was negative, except mentioned above   Past Medical History:  Diagnosis Date   Acute coronary syndrome (HCC)    Anemia    B12 deficiency    Chest pain    CHF (  congestive heart failure) (HCC)    Chronic combined systolic and diastolic CHF (congestive heart failure) ([place])    a. 07/2015 Echo: EF 45-50%, mod LVH, mid apical/antsept AK, Gr 1 DD.   Chronic low back pain    CKD (chronic kidney disease), stage III (HCC)    Constipation    COPD (chronic obstructive pulmonary disease) (HCC)    Coronary artery disease    a. 2012 s/p MI/PCI: s/p DES to mid/dist RCA and overlapping DES to LAD;  b. 07/2015 Cath: LAD 10% ISR, D1 40(jailed), LCX 49m OM2 30, OM3 30, RCA 553mSR, 10d ISR; c. 07/2016 NSTEMI/PCI: LAD 85ost/p/m (3.5x20 Synergy DES overlapping prior stent), D1 40, LCX 7573mM2 95 (2.75x16 Synergy DES), OM3 30, RCA 6m80md.    Depression    Diabetic gastroparesis (HCC)[place] Diabetic polyneuropathy (HCC)[place] DKA (diabetic ketoacidoses) 03/14/2018   GERD (gastroesophageal reflux disease)    Gout    Heart failure (HCC)    Heart murmur    History of MI (myocardial infarction)    Hyperlipemia    Hypertension    Hypertensive heart disease    Hypokalemia    Ischemic cardiomyopathy    a. 07/2015 Echo: EF 45-50%, mod LVH, mid-apicalanteroseptal AK, Gr1 DD.   Joint pain    Kidney problem    Morbid obesity (HCC)[place] OSA on CPAP    Osteoarthritis    Osteomyelitis (HCC)[place] a. 07/2016 MTP joint of R great toe s/p transmetatarsal amputation.   Other fatigue    Polyneuropathy in diabetes(357.2)    Pulmonary sarcoidosis (HCC)    "problems w/it years ago; no problems anymore" (07/03/2014)   Rectal bleeding    Shortness of breath    Shortness of breath on exertion    Sleep apnea    Stomach ulcer    Swallowing difficulty    Type II diabetes mellitus (HCC)        Scheduled Meds:  ascorbic acid  500 mg Oral BID   aspirin EC  81 mg Oral Daily   atorvastatin  80 mg Oral Daily   dapagliflozin propanediol  10 mg Oral Daily   furosemide  80 mg Oral BID   gabapentin  100 mg Oral QHS   heparin  5,000 Units Subcutaneous Q8H   insulin aspart  0-20 Units Subcutaneous TID WC   insulin aspart  0-5 Units Subcutaneous QHS   isosorbide mononitrate  180 mg Oral Daily   metoprolol succinate  75 mg Oral Daily   multivitamin with minerals  1 tablet Oral Daily   nutrition supplement (JUVEN)  1 packet Oral BID BM   pantoprazole  40 mg Oral BID   potassium chloride  40 mEq Oral Q4H   ticagrelor  90 mg Oral BID   Continuous Infusions: PRN Meds:.acetaminophen **OR** acetaminophen, ondansetron **OR** ondansetron (ZOFRAN) IV, oxyCODONE-acetaminophen, senna-docusate   OBJECTIVE: Blood pressure 136/78, pulse 87, temperature 98 F (36.7 C), temperature source Oral, resp. rate 16, height _0  (1.753 m), weight 129 kg, SpO2 100  %.  Physical Exam    Lab Results Lab Results  Component Value Date   WBC 9.0 11/06/2022   HGB 12.1 (L) 11/06/2022   HCT 38.6 (L) 11/06/2022   MCV 87.9 11/06/2022   PLT 245 11/06/2022    Lab Results  Component Value Date   CREATININE 2.21 (H) 11/06/2022   BUN 29 (H) 11/06/2022   NA 136 11/06/2022   K 2.9 (L) 11/06/2022  CL 103 11/06/2022   CO2 21 (L) 11/06/2022    Lab Results  Component Value Date   ALT 12 08/11/2021   AST 16 08/11/2021   ALKPHOS 183 (H) 08/11/2021   BILITOT <0.2 08/11/2021      Microbiology: No results found for this or any previous visit (from the past 240 hour(s)).   Serology:    Imaging: If present, new imagings (plain films, ct scans, and mri) have been personally visualized and interpreted; radiology reports have been reviewed. Decision making incorporated into the Impression / Recommendations.  11/25 mri left foot 1. Open wound along the lateral aspect of the foot near the base of the fifth metatarsal with underlying osteomyelitis involving the fifth metatarsal base. 2. Diffuse cellulitis and myofasciitis but no discrete drainablesoft tissue abscess. 3. Moderate age advanced midfoot degenerative changes with scattered erosive findings. Neuropathic disease versus inflammatory arthropathy.  Jabier Mutton, [facility] [place] [phone] pager    11/06/2022, 10:29 AM

## 2022-11-06 NOTE — Assessment & Plan Note (Signed)
Stable and compensated. -Continue home Lasix, Toprol-XL, [place]

## 2022-11-06 NOTE — Assessment & Plan Note (Signed)
X-ray with possible osteomyelitis involving chronic avulsion fracture of the fifth metatarsal base associated with soft tissue ulcer.  Patient is currently hemodynamically stable.  He has been on doxycycline as an outpatient. -Follow MRI left foot -Hold further antibiotics for now -Podiatry consult pending MRI

## 2022-11-06 NOTE — Assessment & Plan Note (Signed)
Stable, denies any chest pain. -Continue aspirin, Brilinta, statin, Imdur

## 2022-11-06 NOTE — ED Notes (Signed)
Pt transported to vascular lab. 

## 2022-11-06 NOTE — Progress Notes (Signed)
Pt states that he hasn't worn CPAP QHS in awhile but is willing to try it tonight.  Pt requesting RT to come back at midnight.

## 2022-11-06 NOTE — Hospital Course (Signed)
Joshua [name] is a 66 y.o. male with medical history significant for HFpEF (EF 55-60%), CAD s/p multiple PCI, CKD stage IV, COPD, T2DM, HTN, HLD, OSA on CPAP who is admitted with suspected osteomyelitis of the left fifth metatarsal.

## 2022-11-06 NOTE — Consult Note (Signed)
PODIATRY CONSULTATION  NAME Joshua [name] MRN [id number] DOB 1956/07/22 DOA 11/05/2022   Reason for consult:  Chief Complaint  Patient presents with   Foot Pain    Attending/Consulting physician: Debbe Odea, MD  History of present illness: 66 y.o. male heart failure with preserved EF, CAD status post PCI and stenting, CKD stage IV, diabetes mellitus type 2, COPD, hypertension, obesity, prior right transmetatarsal amputation who presents to the hospital for an evaluation of a left foot wound.  The patient states that the wound began as a callus about 2 months ago.  He was evaluated by podiatry and the callus was shaved off.  He subsequently had an infection in that area and was placed on oral antibiotics which he was taking prior to admission.  The wound has since not closed up and this past week the pain increased in his foot.  Wound has been getting worse per pat. Denies much drainage but has noted increased swelling in the foot as well as pain. Denies N/V/F/C.   Past Medical History:  Diagnosis Date   Acute coronary syndrome (HCC)    Anemia    B12 deficiency    Chest pain    CHF (congestive heart failure) (HCC)    Chronic combined systolic and diastolic CHF (congestive heart failure) ([place])    a. 07/2015 Echo: EF 45-50%, mod LVH, mid apical/antsept AK, Gr 1 DD.   Chronic low back pain    CKD (chronic kidney disease), stage III (HCC)    Constipation    COPD (chronic obstructive pulmonary disease) (HCC)    Coronary artery disease    a. 2012 s/p MI/PCI: s/p DES to mid/dist RCA and overlapping DES to LAD;  b. 07/2015 Cath: LAD 10% ISR, D1 40(jailed), LCX 37m OM2 30, OM3 30, RCA 543mSR, 10d ISR; c. 07/2016 NSTEMI/PCI: LAD 85ost/p/m (3.5x20 Synergy DES overlapping prior stent), D1 40, LCX 7574mM2 95 (2.75x16 Synergy DES), OM3 30, RCA 22m40md.   Depression    Diabetic gastroparesis (HCC)[place] Diabetic polyneuropathy (HCC)[place] DKA (diabetic ketoacidoses) 03/14/2018   GERD  (gastroesophageal reflux disease)    Gout    Heart failure (HCC)    Heart murmur    History of MI (myocardial infarction)    Hyperlipemia    Hypertension    Hypertensive heart disease    Hypokalemia    Ischemic cardiomyopathy    a. 07/2015 Echo: EF 45-50%, mod LVH, mid-apicalanteroseptal AK, Gr1 DD.   Joint pain    Kidney problem    Morbid obesity (HCC)[place] OSA on CPAP    Osteoarthritis    Osteomyelitis (HCC)[place] a. 07/2016 MTP joint of R great toe s/p transmetatarsal amputation.   Other fatigue    Polyneuropathy in diabetes(357.2)    Pulmonary sarcoidosis (HCC)[place] "problems w/it years ago; no problems anymore" (07/03/2014)   Rectal bleeding    Shortness of breath    Shortness of breath on exertion    Sleep apnea    Stomach ulcer    Swallowing difficulty    Type II diabetes mellitus (HCC)[place]     Latest Ref Rng & Units 11/06/2022    5:14 AM 11/05/2022    4:43 PM 08/11/2021    9:29 AM  CBC  WBC 4.0 - 10.5 K/uL 9.0  12.5  8.6   Hemoglobin 13.0 - 17.0 g/dL 12.1  12.6  12.3   Hematocrit 39.0 - 52.0 %  38.6  39.8  39.1   Platelets 150 - 400 K/uL 245  272  254        Latest Ref Rng & Units 11/06/2022    5:14 AM 11/05/2022    4:43 PM 10/26/2021   10:44 AM  BMP  Glucose 70 - 99 mg/dL 101  214  217   BUN 8 - 23 mg/dL 29  30  33   Creatinine 0.61 - 1.24 mg/dL 2.21  2.44  2.55   BUN/Creat Ratio 10 - 24   13   Sodium 135 - 145 mmol/L 136  135  141   Potassium 3.5 - 5.1 mmol/L 2.9  3.1  3.5   Chloride 98 - 111 mmol/L 103  99  98   CO2 22 - 32 mmol/L _0 Calcium 8.9 - 10.3 mg/dL 8.6  8.9  9.2       Physical Exam: Lower Extremity Exam Vasc: R - PT palpable, DP palpable.   L - PT 1/4 palpable, DP 1/4 palpable. Both diminished related to edema of the L foot. Cap refill <3 sec to digits  Derm: R - Healed TMA no open wound  L - Large 3-4 cm ulceration with fibrotic necrotic tissue in the base, no active draiange, edema and erythema of the midfoot.     MSK:  R -  S/p TMA 6 yrs ago well healed  L - Slight varus deformity of the foot at rest  Neuro: R - Gross sensation diminisehd. Gross motor function intact   L - Gross sensation diminished. Gross motor function intact    ASSESSMENT/PLAN OF CARE 66 y.o. male with PMHx significant for  eart failure with preserved EF, CAD status post PCI and stenting, CKD stage IV, diabetes mellitus type 2, COPD, hypertension, obesity, prior right transmetatarsal amputation  with chronic Left midfoot ulceration with necrosis of the 5th met base concern for osteomyelitis on MRI.   - NPO at [facility] for OR 11/26 AM for left foot ulcer debridement and bone biopsy 5th met base. Likely plan for wound graft and vac also.  - Continue IV abx broad spectrum pending further culture data. ID consult helpful as pt will require long term IV abx for attempt at conservative treatment of likely 5th met base OM.  - Pt is aware of possible risk of higher level amputation in the leg if the wound fails to heal after long term IV abx with wound care/vac.  - Anticoagulation: Hold pending OR - WB status: WBAT to LLE - Will continue to follow   Thank you for the consult.  Please contact me directly with any questions or concerns.           Everitt Amber, DPM [facility]    [address], [place] [address]                Office [phone]  Fax [phone]

## 2022-11-06 NOTE — Assessment & Plan Note (Signed)
Continue Semglee 20 units twice daily, SSI, Farxiga.

## 2022-11-06 NOTE — Assessment & Plan Note (Signed)
Oral supplement given. 

## 2022-11-06 NOTE — ED Notes (Signed)
Pt transported to MRI 

## 2022-11-06 NOTE — Assessment & Plan Note (Signed)
Renal function stable.  Continue to monitor.

## 2022-11-06 NOTE — ED Notes (Signed)
ED TO INPATIENT HANDOFF REPORT  ED Nurse Name and Phone #: Leavy Cella Octavia Velador [phone]  S Name/Age/Gender Joshua [name] 66 y.o. male Room/Bed: 014C/014C  Code Status   Code Status: Full Code  Home/SNF/Other Home Patient oriented to: self, place, time, situation Is this baseline? Yes   Triage Complete: Triage complete  Chief Complaint Osteomyelitis of fifth toe of left foot (HCC) [M86.9] Osteomyelitis (HCC) [M86.9]  Triage Note Pt with chronic wound to left foot.  Had a virtual visit today and stated he was told it may be a good idea to have it evaluated.  Has been seeing a foot specialist.  No fever/chills but is in a lot of pain.     Allergies Allergies  Allergen Reactions   Chlorthalidone Other (See Comments)    Causes dehydration, kidneys shut down    Level of Care/Admitting Diagnosis ED Disposition     ED Disposition  Admit   Condition  --   Comment  Hospital Area: [facility] [[id number]]  Level of Care: Med-Surg [[id number]]  May admit patient to [facility] or [facility] if equivalent level of care is available:: No  Covid Evaluation: Asymptomatic - no recent exposure (last 10 days) testing not required  Diagnosis: Osteomyelitis [facility]) [[id number]]  Admitting Physician: Calvert Cantor [[id number]]  Attending Physician: Calvert Cantor [[id number]]  Certification:: I certify there are rare and unusual circumstances requiring inpatient admission  Estimated Length of Stay: 3          B Medical/Surgery History Past Medical History:  Diagnosis Date   Acute coronary syndrome (HCC)    Anemia    B12 deficiency    Chest pain    CHF (congestive heart failure) (HCC)    Chronic combined systolic and diastolic CHF (congestive heart failure) (HCC)    a. 07/2015 Echo: EF 45-50%, mod LVH, mid apical/antsept AK, Gr 1 DD.   Chronic low back pain    CKD (chronic kidney disease), stage III (HCC)    Constipation    COPD (chronic obstructive pulmonary disease) (HCC)     Coronary artery disease    a. 2012 s/p MI/PCI: s/p DES to mid/dist RCA and overlapping DES to LAD;  b. 07/2015 Cath: LAD 10% ISR, D1 40(jailed), LCX 26m, OM2 30, OM3 30, RCA 81m ISR, 10d ISR; c. 07/2016 NSTEMI/PCI: LAD 85ost/p/m (3.5x20 Synergy DES overlapping prior stent), D1 40, LCX 4m, OM2 95 (2.75x16 Synergy DES), OM3 30, RCA 61m, 10d.   Depression    Diabetic gastroparesis (HCC)    Diabetic polyneuropathy (HCC)    DKA (diabetic ketoacidoses) 03/14/2018   GERD (gastroesophageal reflux disease)    Gout    Heart failure (HCC)    Heart murmur    History of MI (myocardial infarction)    Hyperlipemia    Hypertension    Hypertensive heart disease    Hypokalemia    Ischemic cardiomyopathy    a. 07/2015 Echo: EF 45-50%, mod LVH, mid-apicalanteroseptal AK, Gr1 DD.   Joint pain    Kidney problem    Morbid obesity (HCC)    OSA on CPAP    Osteoarthritis    Osteomyelitis (HCC)    a. 07/2016 MTP joint of R great toe s/p transmetatarsal amputation.   Other fatigue    Polyneuropathy in diabetes(357.2)    Pulmonary sarcoidosis (HCC)    "problems w/it years ago; no problems anymore" (07/03/2014)   Rectal bleeding    Shortness of breath    Shortness of breath on exertion  Sleep apnea    Stomach ulcer    Swallowing difficulty    Type II diabetes mellitus ([place])    Past Surgical History:  Procedure Laterality Date   AMPUTATION Right 07/24/2016   Procedure: TRANSMETATARSAL FOOT AMPUTATION;  Surgeon: [name], MD;  Location: [place];  Service: Orthopedics;  Laterality: Right;   BALLOON DILATION N/A 03/21/2018   Procedure: BALLOON DILATION;  Surgeon: [name], MD;  Location: [facility] ENDOSCOPY;  Service: Endoscopy;  Laterality: N/A;   BIOPSY  03/16/2022   Procedure: BIOPSY;  Surgeon: [name], MD;  Location: [facility] ENDOSCOPY;  Service: Endoscopy;;   BREAST LUMPECTOMY Right ~ 2000   "benign"   CARDIAC CATHETERIZATION N/A 07/30/2015   Procedure: Left Heart Cath and Coronary Angiography;   Surgeon: Burnell Blanks, MD;  Location: [place] CV LAB;  Service: Cardiovascular;  Laterality: N/A;   CARDIAC CATHETERIZATION N/A 07/19/2016   Procedure: Left Heart Cath and Coronary Angiography;  Surgeon: Belva Crome, MD;  Location: [place] CV LAB;  Service: Cardiovascular;  Laterality: N/A;   CARDIAC CATHETERIZATION N/A 07/29/2016   Procedure: Coronary Stent Intervention;  Surgeon: Belva Crome, MD;  Location: [place] CV LAB;  Service: Cardiovascular;  Laterality: N/A;   CARDIAC CATHETERIZATION N/A 07/29/2016   Procedure: Intravascular Ultrasound/IVUS;  Surgeon: Belva Crome, MD;  Location: [place] CV LAB;  Service: Cardiovascular;  Laterality: N/A;   CATARACT EXTRACTION W/ INTRAOCULAR LENS  IMPLANT, BILATERAL Bilateral 2014-2015   COLONOSCOPY WITH PROPOFOL N/A 08/22/2017   Procedure: COLONOSCOPY WITH PROPOFOL;  Surgeon: [name], MD;  Location: [place];  Service: Endoscopy;  Laterality: N/A;   COLONOSCOPY WITH PROPOFOL N/A 03/16/2022   Procedure: COLONOSCOPY WITH PROPOFOL;  Surgeon: [name], MD;  [facility] ENDOSCOPY;  Service: Endoscopy;  Laterality: N/A;   CORONARY ANGIOPLASTY WITH STENT PLACEMENT  10/31/2011   30 % MID ATRIOVENTRICULAR GROOVE CX STENOSIS. 90 % MID RCA.PTA TO LAD/STENTING OF TANDEM 60-70%. LAD STENOSIS 99% REDUCED TO 0%.3.0 X 32MM PROMUS DES POSTDILATED TO 3.25MM.   CORONARY ANGIOPLASTY WITH STENT PLACEMENT  07/03/2014   "2"   CORONARY STENT INTERVENTION N/A 04/09/2019   Procedure: CORONARY STENT INTERVENTION;  Surgeon: Jettie Booze, MD;  Location: [place] CV LAB;  Service: Cardiovascular;  Laterality: N/A;   CORONARY STENT INTERVENTION N/A 08/22/2019   Procedure: CORONARY STENT INTERVENTION;  Surgeon: Martinique, Peter M, MD;  Location: [place] CV LAB;  Service: Cardiovascular;  Laterality: N/A;   ESOPHAGOGASTRODUODENOSCOPY (EGD) WITH PROPOFOL N/A 03/21/2018   Procedure: ESOPHAGOGASTRODUODENOSCOPY (EGD) WITH PROPOFOL;   Surgeon: [name], MD;  Location: [place];  Service: Endoscopy;  Laterality: N/A;   ESOPHAGOGASTRODUODENOSCOPY (EGD) WITH PROPOFOL N/A 03/16/2022   Procedure: ESOPHAGOGASTRODUODENOSCOPY (EGD) WITH PROPOFOL;  Surgeon: [name], MD;  Location[facility] ENDOSCOPY;  Service: Endoscopy;  Laterality: N/A;   I & D EXTREMITY Right 10/30/2013   Procedure: IRRIGATION AND DEBRIDEMENT RIGHT SMALL FINGER POSSIBLE SECONDARY WOUND CLOSURE;  Surgeon: [name], MD;  Location: [facility] ORS;  Service: Orthopedics;  Laterality: Right;  [name] is available after 4pm   I & D EXTREMITY Right 11/01/2013   Procedure:  IRRIGATION AND DEBRIDEMENT RIGHT  PROXIMAL PHALANGEAL LEVEL AMPUTATION;  Surgeon: [name], MD;  Location: [facility];  Service: Orthopedics;  Laterality: Right;   INCISION AND DRAINAGE Right 10/28/2013   Procedure: INCISION AND DRAINAGE Right small finger;  Surgeon: [name], MD;  Location: [facility];  Service: Orthopedics;  Laterality: Right;   LEFT HEART CATH  Left 11/03/2011   Procedure: LEFT HEART CATH;  Surgeon: Lennette Bihari, MD;  Location: [facility] CATH LAB;  Service: Cardiovascular;  Laterality: Left;   LEFT HEART CATH AND CORONARY ANGIOGRAPHY N/A 04/09/2019   Procedure: LEFT HEART CATH AND CORONARY ANGIOGRAPHY;  Surgeon: Corky Crafts, MD;  Location: [facility] INVASIVE CV LAB;  Service: Cardiovascular;  Laterality: N/A;   LEFT HEART CATHETERIZATION WITH CORONARY ANGIOGRAM N/A 07/03/2014   Procedure: LEFT HEART CATHETERIZATION WITH CORONARY ANGIOGRAM;  Surgeon: Lesleigh Noe, MD;  Location: [facility] CATH LAB;  Service: Cardiovascular;  Laterality: N/A;   PERCUTANEOUS CORONARY STENT INTERVENTION (PCI-S) Left 11/03/2011   Procedure: PERCUTANEOUS CORONARY STENT INTERVENTION (PCI-S);  Surgeon: Lennette Bihari, MD;  Location: [facility] CATH LAB;  Service: Cardiovascular;  Laterality: Left;   RIGHT/LEFT HEART CATH AND CORONARY ANGIOGRAPHY N/A 08/22/2019   Procedure: RIGHT/LEFT HEART CATH AND  CORONARY ANGIOGRAPHY;  Surgeon: [place], Peter M, MD;  Location: [facility] INVASIVE CV LAB;  Service: Cardiovascular;  Laterality: N/A;   TOE AMPUTATION Right ~ 2010   "2nd toe"   TOE AMPUTATION Right 2012   "3rd toe"     A IV Location/Drains/Wounds Patient Lines/Drains/Airways Status     Active Line/Drains/Airways     Name Placement date Placement time Site Days   Peripheral IV 11/05/22 22 G 1.75" Anterior;Right Forearm 11/05/22  2217  Forearm  1   Wound / Incision (Open or Dehisced) 11/06/22 Non-pressure wound Foot Anterior;Left 11/06/22  0825  Foot  less than 1            Intake/Output Last 24 hours  Intake/Output Summary (Last 24 hours) at 11/06/2022 1405 Last data filed at 11/06/2022 0746 Gross per 24 hour  Intake --  Output 600 ml  Net -600 ml    Labs/Imaging Results for orders placed or performed during the hospital encounter of 11/05/22 (from the past 48 hour(s))  CBC with Differential     Status: Abnormal   Collection Time: 11/05/22  4:43 PM  Result Value Ref Range   WBC 12.5 (H) 4.0 - 10.5 K/uL   RBC 4.56 4.22 - 5.81 MIL/uL   Hemoglobin 12.6 (L) 13.0 - 17.0 g/dL   HCT 83.8 78.2 - 80.7 %   MCV 87.3 80.0 - 100.0 fL   MCH 27.6 26.0 - 34.0 pg   MCHC 31.7 30.0 - 36.0 g/dL   RDW 66.6 (H) 62.3 - 12.9 %   Platelets 272 150 - 400 K/uL   nRBC 0.0 0.0 - 0.2 %   Neutrophils Relative % 77 %   Neutro Abs 9.6 (H) 1.7 - 7.7 K/uL   Lymphocytes Relative 11 %   Lymphs Abs 1.4 0.7 - 4.0 K/uL   Monocytes Relative 11 %   Monocytes Absolute 1.4 (H) 0.1 - 1.0 K/uL   Eosinophils Relative 0 %   Eosinophils Absolute 0.0 0.0 - 0.5 K/uL   Basophils Relative 0 %   Basophils Absolute 0.0 0.0 - 0.1 K/uL   Immature Granulocytes 1 %   Abs Immature Granulocytes 0.12 (H) 0.00 - 0.07 K/uL    Comment: Performed at [facility], [address], [place], [place] [address]  Basic metabolic panel     Status: Abnormal   Collection Time: 11/05/22  4:43 PM  Result Value Ref Range   Sodium  135 135 - 145 mmol/L   Potassium 3.1 (L) 3.5 - 5.1 mmol/L   Chloride 99 98 - 111 mmol/L   CO2 22 22 - 32 mmol/L  Glucose, Bld 214 (H) 70 - 99 mg/dL    Comment: Glucose reference range applies only to samples taken after fasting for at least 8 hours.   BUN 30 (H) 8 - 23 mg/dL   Creatinine, Ser 0.07 (H) 0.61 - 1.24 mg/dL   Calcium 8.9 8.9 - 99.8 mg/dL   GFR, Estimated 29 (L) >60 mL/min    Comment: (NOTE) Calculated using the CKD-EPI Creatinine Equation (2021)    Anion gap 14 5 - 15    Comment: Performed at [facility], [address] [place], [place] [address]  CBG monitoring, ED     Status: None   Collection Time: 11/05/22 10:30 PM  Result Value Ref Range   Glucose-Capillary 96 70 - 99 mg/dL    Comment: Glucose reference range applies only to samples taken after fasting for at least 8 hours.  HIV Antibody (routine testing w rflx)     Status: None   Collection Time: 11/06/22  5:14 AM  Result Value Ref Range   HIV Screen 4th Generation wRfx Non Reactive Non Reactive    Comment: Performed at [facility], [address] [place], [place] [address]  CBC     Status: Abnormal   Collection Time: 11/06/22  5:14 AM  Result Value Ref Range   WBC 9.0 4.0 - 10.5 K/uL   RBC 4.39 4.22 - 5.81 MIL/uL   Hemoglobin 12.1 (L) 13.0 - 17.0 g/dL   HCT 36.9 (L) 01.9 - 53.7 %   MCV 87.9 80.0 - 100.0 fL   MCH 27.6 26.0 - 34.0 pg   MCHC 31.3 30.0 - 36.0 g/dL   RDW 69.1 (H) 14.6 - 54.5 %   Platelets 245 150 - 400 K/uL   nRBC 0.0 0.0 - 0.2 %    Comment: Performed at [facility], [address] [place], [place] [address]  Basic metabolic panel     Status: Abnormal   Collection Time: 11/06/22  5:14 AM  Result Value Ref Range   Sodium 136 135 - 145 mmol/L   Potassium 2.9 (L) 3.5 - 5.1 mmol/L   Chloride 103 98 - 111 mmol/L   CO2 21 (L) 22 - 32 mmol/L   Glucose, Bld 101 (H) 70 - 99 mg/dL    Comment: Glucose reference range applies only to samples taken after fasting for at least 8  hours.   BUN 29 (H) 8 - 23 mg/dL   Creatinine, Ser 0.58 (H) 0.61 - 1.24 mg/dL   Calcium 8.6 (L) 8.9 - 10.3 mg/dL   GFR, Estimated 32 (L) >60 mL/min    Comment: (NOTE) Calculated using the CKD-EPI Creatinine Equation (2021)    Anion gap 12 5 - 15    Comment: Performed at [facility], [address], [place], [place] [address]  CBG monitoring, ED     Status: None   Collection Time: 11/06/22  7:41 AM  Result Value Ref Range   Glucose-Capillary 86 70 - 99 mg/dL    Comment: Glucose reference range applies only to samples taken after fasting for at least 8 hours.  Sedimentation rate     Status: Abnormal   Collection Time: 11/06/22 11:17 AM  Result Value Ref Range   Sed Rate 105 (H) 0 - 16 mm/hr    Comment: Performed at [facility], [address] [place], [place] [address]  C-reactive protein     Status: Abnormal   Collection Time: 11/06/22 11:17 AM  Result Value Ref Range   CRP 13.1 (H) <1.0 mg/dL    Comment: Performed at [place] [address] [place], [place] [address]  CBG monitoring, ED     Status: Abnormal   Collection Time: 11/06/22 12:13 PM  Result Value Ref Range   Glucose-Capillary 126 (H) 70 - 99 mg/dL    Comment: Glucose reference range applies only to samples taken after fasting for at least 8 hours.   MR FOOT LEFT W WO CONTRAST  Result Date: 11/06/2022 CLINICAL DATA:  Foot pain and swelling. EXAM: MRI OF THE LEFT FOREFOOT WITHOUT AND WITH CONTRAST TECHNIQUE: Multiplanar, multisequence MR imaging of the left foot was performed both before and after administration of intravenous contrast. CONTRAST:  40mL GADAVIST GADOBUTROL 1 MMOL/ML IV SOLN COMPARISON:  Radiographs, same date. FINDINGS: Open wound noted along the lateral aspect of the foot near the base of the fifth metatarsal. A remote avulsion fracture is noted. Diffuse abnormal T1 and T2 signal intensity in the fifth metatarsal base and subsequent abnormal contrast enhancement consistent with  osteomyelitis. No findings for septic arthritis. Diffuse cellulitis and myofasciitis but no discrete drainable soft tissue abscess. Moderate age advanced midfoot degenerative changes with scattered erosive findings. Neuropathic disease versus inflammatory arthropathy. The tibiotalar and subtalar joints are maintained. The major tendons ligaments are intact. IMPRESSION: 1. Open wound along the lateral aspect of the foot near the base of the fifth metatarsal with underlying osteomyelitis involving the fifth metatarsal base. 2. Diffuse cellulitis and myofasciitis but no discrete drainable soft tissue abscess. 3. Moderate age advanced midfoot degenerative changes with scattered erosive findings. Neuropathic disease versus inflammatory arthropathy. Electronically Signed   By: Marijo Sanes M.D.   On: 11/06/2022 08:58   DG Foot Complete Left  Result Date: 11/05/2022 CLINICAL DATA:  Pain and diabetic foot ulcer EXAM: LEFT FOOT - COMPLETE 3+ VIEW COMPARISON:  None Available. FINDINGS: Chronic avulsion fracture of the base of the fifth metatarsal. There is cortical irregularity with loss of cortical definition on both sides of the fracture fragments about the fracture line. No acute fracture or dislocation. Soft tissue swelling about the fifth metatarsal base with laceration/ulcer about the lateral aspect of the foot at the fifth metatarsal base. Soft tissue swelling about the dorsum of the foot. Degenerative arthritis about the midfoot. IMPRESSION: Findings concerning for osteomyelitis about the chronic avulsion fracture of the fifth metatarsal base. Consider MRI for further evaluation. Adjacent soft tissue ulcer about the fifth metatarsal with soft tissue swelling about the lateral and dorsal foot. Electronically Signed   By: Placido Sou M.D.   On: 11/05/2022 17:23    Pending Labs Unresulted Labs (From admission, onward)     Start     Ordered   11/07/22 4497  Basic metabolic panel  Tomorrow morning,   R         11/06/22 1036   11/06/22 0737  Magnesium  Add-on,   AD        11/06/22 0736            Vitals/Pain Today's Vitals   11/06/22 0956 11/06/22 1100 11/06/22 1200 11/06/22 1338  BP: 136/78 123/77 138/82 135/89  Pulse: 87 82 82 87  Resp: 16   16  Temp: 98 F (36.7 C)   98.1 F (36.7 C)  TempSrc: Oral   Oral  SpO2: 100% 97% 98% 98%  Weight:      Height:      PainSc: 3    3  Isolation Precautions No active isolations  Medications Medications  oxyCODONE-acetaminophen (PERCOCET/ROXICET) 5-325 MG per tablet 1 tablet (1 tablet Oral Given 11/06/22 0602)  heparin injection 5,000 Units (5,000 Units Subcutaneous Given 11/06/22 1340)  acetaminophen (TYLENOL) tablet 650 mg (has no administration in time range)    Or  acetaminophen (TYLENOL) suppository 650 mg (has no administration in time range)  ondansetron (ZOFRAN) tablet 4 mg (has no administration in time range)    Or  ondansetron (ZOFRAN) injection 4 mg (has no administration in time range)  senna-docusate (Senokot-S) tablet 1 tablet (has no administration in time range)  aspirin EC tablet 81 mg (81 mg Oral Given 11/06/22 1000)  atorvastatin (LIPITOR) tablet 80 mg (80 mg Oral Given 11/06/22 1000)  dapagliflozin propanediol (FARXIGA) tablet 10 mg (10 mg Oral Given 11/06/22 1000)  furosemide (LASIX) tablet 80 mg (80 mg Oral Given 11/06/22 0810)  gabapentin (NEURONTIN) capsule 100 mg (100 mg Oral Given 11/06/22 0236)  isosorbide mononitrate (IMDUR) 24 hr tablet 180 mg (180 mg Oral Given 11/06/22 1000)  metoprolol succinate (TOPROL-XL) 24 hr tablet 75 mg (75 mg Oral Given 11/06/22 0959)  pantoprazole (PROTONIX) EC tablet 40 mg (40 mg Oral Given 11/06/22 1000)  ticagrelor (BRILINTA) tablet 90 mg (90 mg Oral Given 11/06/22 1000)  insulin aspart (novoLOG) injection 0-20 Units (3 Units Subcutaneous Given 11/06/22 1217)  insulin aspart (novoLOG) injection 0-5 Units ( Subcutaneous Not Given 11/06/22 0222)  nutrition supplement  (JUVEN) (JUVEN) powder packet 1 packet (1 packet Oral Given 11/06/22 1340)  ascorbic acid (VITAMIN C) tablet 500 mg (500 mg Oral Given 11/06/22 1003)  multivitamin with minerals tablet 1 tablet (1 tablet Oral Given 11/06/22 0959)  oxyCODONE-acetaminophen (PERCOCET/ROXICET) 5-325 MG per tablet 1 tablet (1 tablet Oral Given 11/05/22 1647)  piperacillin-tazobactam (ZOSYN) IVPB 3.375 g (0 g Intravenous Stopped 11/05/22 2312)  potassium chloride SA (KLOR-CON M) CR tablet 40 mEq (40 mEq Oral Given 11/06/22 0031)  gadobutrol (GADAVIST) 1 MMOL/ML injection 10 mL (10 mLs Intravenous Contrast Given 11/06/22 0410)  potassium chloride SA (KLOR-CON M) CR tablet 40 mEq (40 mEq Oral Given 11/06/22 1105)    Mobility walks Low fall risk   Focused Assessments Cardiac Assessment Handoff:    Lab Results  Component Value Date   TDDUKGU 542 ([place]) 06/09/2020   CKMB 8.5 ([place]) 07/03/2014   TROPONINI 0.47 ([place]) 04/06/2019   No results found for: "DDIMER" Does the Patient currently have chest pain? No    R Recommendations: See Admitting Provider Note  Report given to:   Additional Notes:

## 2022-11-06 NOTE — Progress Notes (Signed)
[facility] Progress Note  Patient: Joshua [name]     UXL:[id number]  DOA: 11/05/2022   PCP: [name], MD       Brief hospital course: This is a 66 year old male with heart failure with preserved EF, CAD status post PCI and stenting, CKD stage IV, diabetes mellitus type 2, COPD, hypertension, obesity, prior right transmetatarsal amputation who presents to the hospital for an evaluation of a left foot wound. The patient states that the wound began as a callus about 2 months ago.  He was evaluated by podiatry and the callus was shaved off.  He subsequently had an infection in that area and was placed on oral antibiotics.  The wound has since not closed up and this past week the pain increased in his foot.  He therefore presented to the ED for an evaluation.  Subjective:  States that Percocet is controlling his pain for the moment.  He has no other complaints.  Assessment and Plan: Principal Problem: Wound on left lateral aspect of the left foot-osteomyelitis of fifth toe of left foot ([place]) -I have touch base with podiatry who will consider a bone biopsy-for this reason we will hold off on giving the patient further antibiotics so that we can increase yield from cultures - We will also request an ID consult as the patient will likely need IV antibiotics - His social situation may make it difficult for this wound to heal-he is partly homeless and has been living in a room that he shares with a roommate.  He has no access to proper food and has been going to his son's home to shower and keep the wound clean. - He is also malnourished due to lack of access to healthy food and states that he has lost 30 pounds -we will add dietary supplements to aid in wound healing  Active Problems:   CAD S/P multiple PCIs   (HFpEF) heart failure with preserved ejection fraction ([place]) -Last echo in 9/22 revealed grade 2 diastolic dysfunction, moderate dilatation of the left atrium and an EF of 55  to 60% - Continue Brilinta, furosemide, aspirin, Lipitor    CKD (chronic kidney disease), stage IV (HCC) -GFR ranges between 29-32  Hypokalemia - Secondary to furosemide - Continue to replace    Insulin dependent type 2 diabetes mellitus ([place]) -The patient states that due to his poor oral intake over the past couple of months, he has not had to take as much insulin as he was previously taking - A1c is 7.6 - Continue NovoLog sliding scale and Farxiga  Body mass index is 42 kg/m. -Morbid obesity     Code Status: Full Code Consultants: Podiatry, ID Level of Care: Level of care: Med-Surg Total time on patient care: 40 minutes DVT prophylaxis: SCD  Objective:   Vitals:   11/06/22 0600 11/06/22 0637 11/06/22 0700 11/06/22 0956  BP: 135/81 135/81 136/85 136/78  Pulse: 81 80 79 87  Resp:  18  16  Temp:  98.5 F (36.9 C)  98 F (36.7 C)  TempSrc:  Oral  Oral  SpO2: 99% 97% 96% 100%  Weight:      Height:       Filed Weights   11/05/22 2130 11/06/22 0327  Weight: 128.8 kg 129 kg   Exam: General exam: Appears comfortable  HEENT: oral mucosa moist Respiratory system: Clear to auscultation.  Cardiovascular system: S1 & S2 heard  Gastrointestinal system: Abdomen soft, non-tender, nondistended. Normal bowel sounds   Extremities: No  cyanosis, clubbing - no drainage from ulcer- see picture below Psychiatry:  Mood & affect appropriate.      CBC: Recent Labs  Lab 11/05/22 1643 11/06/22 0514  WBC 12.5* 9.0  NEUTROABS 9.6*  --   HGB 12.6* 12.1*  HCT 39.8 38.6*  MCV 87.3 87.9  PLT 272 161   Basic Metabolic Panel: Recent Labs  Lab 11/05/22 1643 11/06/22 0514  NA 135 136  K 3.1* 2.9*  CL 99 103  CO2 22 21*  GLUCOSE 214* 101*  BUN 30* 29*  CREATININE 2.44* 2.21*  CALCIUM 8.9 8.6*   GFR: Estimated Creatinine Clearance: 44.9 mL/min (A) (by C-G formula based on SCr of 2.21 mg/dL (H)).  Scheduled Meds:  ascorbic acid  500 mg Oral BID   aspirin EC  81 mg Oral  Daily   atorvastatin  80 mg Oral Daily   dapagliflozin propanediol  10 mg Oral Daily   furosemide  80 mg Oral BID   gabapentin  100 mg Oral QHS   heparin  5,000 Units Subcutaneous Q8H   insulin aspart  0-20 Units Subcutaneous TID WC   insulin aspart  0-5 Units Subcutaneous QHS   isosorbide mononitrate  180 mg Oral Daily   metoprolol succinate  75 mg Oral Daily   multivitamin with minerals  1 tablet Oral Daily   nutrition supplement (JUVEN)  1 packet Oral BID BM   pantoprazole  40 mg Oral BID   potassium chloride  40 mEq Oral Q4H   ticagrelor  90 mg Oral BID   Continuous Infusions: Imaging and lab data was personally reviewed MR FOOT LEFT W WO CONTRAST  Result Date: 11/06/2022 CLINICAL DATA:  Foot pain and swelling. EXAM: MRI OF THE LEFT FOREFOOT WITHOUT AND WITH CONTRAST TECHNIQUE: Multiplanar, multisequence MR imaging of the left foot was performed both before and after administration of intravenous contrast. CONTRAST:  81mL GADAVIST GADOBUTROL 1 MMOL/ML IV SOLN COMPARISON:  Radiographs, same date. FINDINGS: Open wound noted along the lateral aspect of the foot near the base of the fifth metatarsal. A remote avulsion fracture is noted. Diffuse abnormal T1 and T2 signal intensity in the fifth metatarsal base and subsequent abnormal contrast enhancement consistent with osteomyelitis. No findings for septic arthritis. Diffuse cellulitis and myofasciitis but no discrete drainable soft tissue abscess. Moderate age advanced midfoot degenerative changes with scattered erosive findings. Neuropathic disease versus inflammatory arthropathy. The tibiotalar and subtalar joints are maintained. The major tendons ligaments are intact. IMPRESSION: 1. Open wound along the lateral aspect of the foot near the base of the fifth metatarsal with underlying osteomyelitis involving the fifth metatarsal base. 2. Diffuse cellulitis and myofasciitis but no discrete drainable soft tissue abscess. 3. Moderate age advanced  midfoot degenerative changes with scattered erosive findings. Neuropathic disease versus inflammatory arthropathy. Electronically Signed   By: Marijo Sanes M.D.   On: 11/06/2022 08:58   DG Foot Complete Left  Result Date: 11/05/2022 CLINICAL DATA:  Pain and diabetic foot ulcer EXAM: LEFT FOOT - COMPLETE 3+ VIEW COMPARISON:  None Available. FINDINGS: Chronic avulsion fracture of the base of the fifth metatarsal. There is cortical irregularity with loss of cortical definition on both sides of the fracture fragments about the fracture line. No acute fracture or dislocation. Soft tissue swelling about the fifth metatarsal base with laceration/ulcer about the lateral aspect of the foot at the fifth metatarsal base. Soft tissue swelling about the dorsum of the foot. Degenerative arthritis about the midfoot. IMPRESSION: Findings concerning for osteomyelitis about  the chronic avulsion fracture of the fifth metatarsal base. Consider MRI for further evaluation. Adjacent soft tissue ulcer about the fifth metatarsal with soft tissue swelling about the lateral and dorsal foot. Electronically Signed   By: Placido Sou M.D.   On: 11/05/2022 17:23    LOS: 0 days   Author: Eunice Blase Carletta Feasel  11/06/2022 10:26 AM  To contact [facility]>   Check the care team in [facility] and look for the attending/consulting [place] provider listed  Log into [email or web address] and use [place]'s universal password   Go to> "[facility]"  and find provider  If you still have difficulty reaching the provider, please page the [facility] (Director on Call) for the Hospitalists listed on amion

## 2022-11-06 NOTE — Assessment & Plan Note (Signed)
Stable.  Continue home Toprol-XL, Imdur.

## 2022-11-06 NOTE — Assessment & Plan Note (Signed)
Continue CPAP nightly. °

## 2022-11-06 NOTE — Consult Note (Signed)
[place] Nurse Consult Note: WOC Nursing is simultaneously consulted with [facility] for a wound on the left lateral foot. Podiatry has seen and will be taking the patient to the OR tomorrow for irrigation and debridement of the left lateral foot wound.  There is no role for WOC Nursing at this time.  [place] nursing team will not follow, but will remain available to this patient, the nursing and medical teams.  Please re-consult if needed.  Thank you for inviting [place] to participate in this patient's Plan of Care.  Maudie Flakes, MSN, RN, CNS, [place], Serita Grammes, [employer], [employer] phone:  [phone]

## 2022-11-07 ENCOUNTER — Other Ambulatory Visit: Payer: Self-pay

## 2022-11-07 ENCOUNTER — Encounter (HOSPITAL_COMMUNITY): Payer: Self-pay | Admitting: Internal Medicine

## 2022-11-07 ENCOUNTER — Encounter (HOSPITAL_COMMUNITY)
Admission: EM | Disposition: A | Discharge status: Home Health | Payer: Self-pay | Source: Home / Self Care | Attending: Internal Medicine

## 2022-11-07 ENCOUNTER — Inpatient Hospital Stay (HOSPITAL_COMMUNITY): Payer: Medicare Other | Admitting: Certified Registered Nurse Anesthetist

## 2022-11-07 ENCOUNTER — Inpatient Hospital Stay (HOSPITAL_COMMUNITY): Payer: Medicare Other

## 2022-11-07 DIAGNOSIS — E1169 Type 2 diabetes mellitus with other specified complication: Secondary | ICD-10-CM

## 2022-11-07 DIAGNOSIS — Z7984 Long term (current) use of oral hypoglycemic drugs: Secondary | ICD-10-CM | POA: Diagnosis not present

## 2022-11-07 DIAGNOSIS — Z794 Long term (current) use of insulin: Secondary | ICD-10-CM | POA: Diagnosis not present

## 2022-11-07 DIAGNOSIS — I252 Old myocardial infarction: Secondary | ICD-10-CM

## 2022-11-07 DIAGNOSIS — I110 Hypertensive heart disease with heart failure: Secondary | ICD-10-CM

## 2022-11-07 DIAGNOSIS — M869 Osteomyelitis, unspecified: Secondary | ICD-10-CM

## 2022-11-07 DIAGNOSIS — M86172 Other acute osteomyelitis, left ankle and foot: Secondary | ICD-10-CM | POA: Diagnosis not present

## 2022-11-07 DIAGNOSIS — I509 Heart failure, unspecified: Secondary | ICD-10-CM

## 2022-11-07 DIAGNOSIS — I2510 Atherosclerotic heart disease of native coronary artery without angina pectoris: Secondary | ICD-10-CM

## 2022-11-07 HISTORY — PX: INCISION AND DRAINAGE OF WOUND: SHX1803

## 2022-11-07 LAB — BASIC METABOLIC PANEL
Anion gap: 12 (ref 5–15)
BUN: 32 mg/dL — ABNORMAL HIGH (ref 8–23)
CO2: 23 mmol/L (ref 22–32)
Calcium: 9.4 mg/dL (ref 8.9–10.3)
Chloride: 104 mmol/L (ref 98–111)
Creatinine, Ser: 2.14 mg/dL — ABNORMAL HIGH (ref 0.61–1.24)
GFR, Estimated: 34 mL/min — ABNORMAL LOW (ref >60)
Glucose, Bld: 143 mg/dL — ABNORMAL HIGH (ref 70–99)
Potassium: 3.8 mmol/L (ref 3.5–5.1)
Sodium: 139 mmol/L (ref 135–145)

## 2022-11-07 LAB — GLUCOSE, CAPILLARY
Glucose-Capillary: 130 mg/dL — ABNORMAL HIGH (ref 70–99)
Glucose-Capillary: 150 mg/dL — ABNORMAL HIGH (ref 70–99)
Glucose-Capillary: 115 mg/dL — ABNORMAL HIGH (ref 70–99)
Glucose-Capillary: 188 mg/dL — ABNORMAL HIGH (ref 70–99)
Glucose-Capillary: 238 mg/dL — ABNORMAL HIGH (ref 70–99)

## 2022-11-07 SURGERY — IRRIGATION AND DEBRIDEMENT WOUND
Anesthesia: Monitor Anesthesia Care | Laterality: Left

## 2022-11-07 MED ORDER — BUPIVACAINE HCL (PF) 0.5 % IJ SOLN
INTRAMUSCULAR | Status: AC
  Filled 2022-11-07: qty 30

## 2022-11-07 MED ORDER — ONDANSETRON HCL 4 MG/2ML IJ SOLN
INTRAMUSCULAR | Status: DC | PRN
  Administered 2022-11-07: 4 mg via INTRAVENOUS

## 2022-11-07 MED ORDER — CHLORHEXIDINE GLUCONATE 0.12 % MT SOLN: 15.0000 mL | Freq: Once | OROMUCOSAL | Status: AC

## 2022-11-07 MED ORDER — OXYCODONE HCL 5 MG/5ML PO SOLN: 5.0000 mg | Freq: Once | ORAL | Status: DC | PRN

## 2022-11-07 MED ORDER — MIDAZOLAM HCL 2 MG/2ML IJ SOLN
INTRAMUSCULAR | Status: AC
  Filled 2022-11-07: qty 2

## 2022-11-07 MED ORDER — FENTANYL CITRATE (PF) 100 MCG/2ML IJ SOLN: 25.0000 ug | INTRAMUSCULAR | Status: DC | PRN

## 2022-11-07 MED ORDER — 0.9 % SODIUM CHLORIDE (POUR BTL) OPTIME
TOPICAL | Status: DC | PRN
  Administered 2022-11-07: 1000 mL

## 2022-11-07 MED ORDER — OXYCODONE HCL 5 MG PO TABS: 5.0000 mg | ORAL_TABLET | Freq: Once | ORAL | Status: DC | PRN

## 2022-11-07 MED ORDER — FENTANYL CITRATE (PF) 250 MCG/5ML IJ SOLN
INTRAMUSCULAR | Status: AC
  Filled 2022-11-07: qty 5

## 2022-11-07 MED ORDER — FENTANYL CITRATE (PF) 250 MCG/5ML IJ SOLN
INTRAMUSCULAR | Status: DC | PRN
  Administered 2022-11-07: 25 ug via INTRAVENOUS

## 2022-11-07 MED ORDER — BUPIVACAINE HCL (PF) 0.5 % IJ SOLN
INTRAMUSCULAR | Status: DC | PRN
  Administered 2022-11-07: 10 mL

## 2022-11-07 MED ORDER — PHENYLEPHRINE 80 MCG/ML (10ML) SYRINGE FOR IV PUSH (FOR BLOOD PRESSURE SUPPORT)
PREFILLED_SYRINGE | INTRAVENOUS | Status: AC
  Filled 2022-11-07: qty 10

## 2022-11-07 MED ORDER — MIDAZOLAM HCL 2 MG/2ML IJ SOLN
INTRAMUSCULAR | Status: DC | PRN
  Administered 2022-11-07: 2 mg via INTRAVENOUS

## 2022-11-07 MED ORDER — SODIUM CHLORIDE 0.9 % IR SOLN
Status: DC | PRN
  Administered 2022-11-07: 3000 mL

## 2022-11-07 MED ORDER — PROPOFOL 10 MG/ML IV BOLUS
INTRAVENOUS | Status: DC | PRN
  Administered 2022-11-07: 20 mg via INTRAVENOUS

## 2022-11-07 MED ORDER — CHLORHEXIDINE GLUCONATE 0.12 % MT SOLN
OROMUCOSAL | Status: AC
  Administered 2022-11-07: 15 mL via OROMUCOSAL
  Filled 2022-11-07: qty 15

## 2022-11-07 MED ORDER — ONDANSETRON HCL 4 MG/2ML IJ SOLN
INTRAMUSCULAR | Status: AC
  Filled 2022-11-07: qty 2

## 2022-11-07 MED ORDER — INSULIN ASPART 100 UNIT/ML IJ SOLN: 0.0000 [IU] | INTRAMUSCULAR | Status: DC | PRN

## 2022-11-07 MED ORDER — SODIUM CHLORIDE 0.9 % IV SOLN: INTRAVENOUS | Status: DC

## 2022-11-07 MED ORDER — PHENYLEPHRINE 80 MCG/ML (10ML) SYRINGE FOR IV PUSH (FOR BLOOD PRESSURE SUPPORT)
PREFILLED_SYRINGE | INTRAVENOUS | Status: DC | PRN
  Administered 2022-11-07 (×3): 160 ug via INTRAVENOUS

## 2022-11-07 MED ORDER — POTASSIUM CHLORIDE CRYS ER 20 MEQ PO TBCR
40.0000 meq | EXTENDED_RELEASE_TABLET | Freq: Every day | ORAL | Status: AC
  Administered 2022-11-07 – 2022-11-08 (×2): 40 meq via ORAL
  Filled 2022-11-07 (×2): qty 2

## 2022-11-07 MED ORDER — PROPOFOL 10 MG/ML IV BOLUS
INTRAVENOUS | Status: AC
  Filled 2022-11-07: qty 20

## 2022-11-07 MED ORDER — ACETAMINOPHEN 10 MG/ML IV SOLN: 1000.0000 mg | Freq: Once | INTRAVENOUS | Status: DC | PRN

## 2022-11-07 MED ORDER — LIDOCAINE 2% (20 MG/ML) 5 ML SYRINGE
INTRAMUSCULAR | Status: DC | PRN
  Administered 2022-11-07: 50 mg via INTRAVENOUS

## 2022-11-07 MED ORDER — PROPOFOL 500 MG/50ML IV EMUL
INTRAVENOUS | Status: DC | PRN
  Administered 2022-11-07: 100 ug/kg/min via INTRAVENOUS

## 2022-11-07 MED ORDER — PROMETHAZINE HCL 25 MG/ML IJ SOLN: 6.2500 mg | INTRAMUSCULAR | Status: DC | PRN

## 2022-11-07 MED ORDER — ORAL CARE MOUTH RINSE: 15.0000 mL | Freq: Once | OROMUCOSAL | Status: AC

## 2022-11-07 SURGICAL SUPPLY — 43 items
APL PRP STRL LF DISP 70% ISPRP (MISCELLANEOUS) ×1
BAG COUNTER SPONGE SURGICOUNT (BAG) ×1 IMPLANT
BAG SPNG CNTER NS LX DISP (BAG) ×1
BNDG CMPR 9X4 STRL LF SNTH (GAUZE/BANDAGES/DRESSINGS)
BNDG ELASTIC 4X5.8 VLCR STR LF (GAUZE/BANDAGES/DRESSINGS) IMPLANT
BNDG ESMARK 4X9 LF (GAUZE/BANDAGES/DRESSINGS) IMPLANT
BNDG GAUZE DERMACEA FLUFF 4 (GAUZE/BANDAGES/DRESSINGS) IMPLANT
BNDG GZE DERMACEA 4 6PLY (GAUZE/BANDAGES/DRESSINGS) ×1
CHLORAPREP W/TINT 26 (MISCELLANEOUS) ×1 IMPLANT
COVER SURGICAL LIGHT HANDLE (MISCELLANEOUS) ×1 IMPLANT
CUFF TOURN SGL QUICK 18X4 (TOURNIQUET CUFF) IMPLANT
CUFF TOURN SGL QUICK 34 (TOURNIQUET CUFF)
CUFF TRNQT CYL 34X4.125X (TOURNIQUET CUFF) IMPLANT
DRAPE U-SHAPE 47X51 STRL (DRAPES) ×1 IMPLANT
DRSG VAC ATS SM SENSATRAC (GAUZE/BANDAGES/DRESSINGS) IMPLANT
ELECT CAUTERY BLADE 6.4 (BLADE) ×1 IMPLANT
ELECT REM PT RETURN 9FT ADLT (ELECTROSURGICAL) ×1
ELECTRODE REM PT RTRN 9FT ADLT (ELECTROSURGICAL) ×1 IMPLANT
GAUZE SPONGE 4X4 12PLY STRL (GAUZE/BANDAGES/DRESSINGS) IMPLANT
GLOVE BIO SURGEON STRL SZ7 (GLOVE) ×1 IMPLANT
GLOVE BIOGEL PI IND STRL 7.5 (GLOVE) ×1 IMPLANT
GOWN STRL REUS W/ TWL LRG LVL3 (GOWN DISPOSABLE) ×2 IMPLANT
GOWN STRL REUS W/TWL LRG LVL3 (GOWN DISPOSABLE) ×2
KIT BASIN OR (CUSTOM PROCEDURE TRAY) ×1 IMPLANT
KIT TURNOVER KIT B (KITS) ×1 IMPLANT
MANIFOLD NEPTUNE II (INSTRUMENTS) ×1 IMPLANT
MATRIX WOUND MESHED 2X2 (Tissue) IMPLANT
NDL HYPO 25GX1X1/2 BEV (NEEDLE) IMPLANT
NEEDLE HYPO 25GX1X1/2 BEV (NEEDLE) IMPLANT
NS IRRIG 1000ML POUR BTL (IV SOLUTION) ×1 IMPLANT
PACK ORTHO EXTREMITY (CUSTOM PROCEDURE TRAY) ×1 IMPLANT
PAD ARMBOARD 7.5X6 YLW CONV (MISCELLANEOUS) ×2 IMPLANT
PROBE DEBRIDE SONICVAC MISONIX (TIP) IMPLANT
SET CYSTO W/LG BORE CLAMP LF (SET/KITS/TRAYS/PACK) ×1 IMPLANT
SOL PREP POV-IOD 4OZ 10% (MISCELLANEOUS) ×2 IMPLANT
SUT ETHILON 3 0 PS 1 (SUTURE) ×1 IMPLANT
SUT MNCRL AB 3-0 PS2 18 (SUTURE) ×1 IMPLANT
SYR CONTROL 10ML LL (SYRINGE) IMPLANT
TOWEL GREEN STERILE (TOWEL DISPOSABLE) ×1 IMPLANT
TOWEL GREEN STERILE FF (TOWEL DISPOSABLE) ×1 IMPLANT
TUBE CONNECTING 12X1/4 (SUCTIONS) ×1 IMPLANT
WOUND MATRIX MESHED 2X2 (Tissue) ×1 IMPLANT
YANKAUER SUCT BULB TIP NO VENT (SUCTIONS) ×1 IMPLANT

## 2022-11-07 NOTE — Progress Notes (Signed)
PODIATRY PROGRESS NOTE Patient Name: Joshua [name]  DOB [birth year] DOA 11/05/2022  Hospital Day: 3  Assessment:  66 y.o. male with PMHx significant for  eart failure with preserved EF, CAD status post PCI and stenting, CKD stage IV, diabetes mellitus type 2, COPD, hypertension, obesity, prior right transmetatarsal amputation  with chronic Left midfoot ulceration with necrosis of the 5th met base concern for osteomyelitis on MRI.    AF, VSS  WBC:  12.5 ESR/CRP: 105/13.1  Wound/Bone Cultures: p  Imaging: MRI L foot w/wo Contrast 1. Open wound along the lateral aspect of the foot near the base of the fifth metatarsal with underlying osteomyelitis involving the fifth metatarsal base. 2. Diffuse cellulitis and myofasciitis but no discrete drainable soft tissue abscess. 3. Moderate age advanced midfoot degenerative changes with scattered erosive findings. Neuropathic disease versus inflammatory arthropathy.  Plan:  - NPO  for OR 11/26 AM for left foot ulcer debridement and bone biopsy 5th met base. Likely plan for wound graft and vac also.  - Continue IV abx broad spectrum pending further culture data. ID consult helpful as pt will require long term IV abx for attempt at conservative treatment of likely 5th met base OM.  - Pt is aware of possible risk of higher level amputation in the leg if the wound fails to heal after long term IV abx with wound care/vac.  - Anticoagulation: Hold pending OR - WB status: WBAT to LLE Will continue to follow        Everitt Amber, DPM [facility]    Subjective:  Pt seen in PACU pre op, aware of plans for OR today and possible need for further surgery / risk of BKA in future if the wound fails to heal. All questions answered.   Objective:   Vitals:   11/07/22 0759 11/07/22 1107  BP: 131/80 126/70  Pulse: 87 85  Resp: 18 16  Temp: (!) 97.4 F (36.3 C) 98.8 F (37.1 C)  SpO2: 99% 97%       Latest Ref Rng & Units  11/06/2022    5:14 AM 11/05/2022    4:43 PM 08/11/2021    9:29 AM  CBC  WBC 4.0 - 10.5 K/uL 9.0  12.5  8.6   Hemoglobin 13.0 - 17.0 g/dL 12.1  12.6  12.3   Hematocrit 39.0 - 52.0 % 38.6  39.8  39.1   Platelets 150 - 400 K/uL 245  272  254        Latest Ref Rng & Units 11/07/2022    4:30 AM 11/06/2022    5:14 AM 11/05/2022    4:43 PM  BMP  Glucose 70 - 99 mg/dL 143  101  214   BUN 8 - 23 mg/dL 32  29  30   Creatinine 0.61 - 1.24 mg/dL 2.14  2.21  2.44   Sodium 135 - 145 mmol/L 139  136  135   Potassium 3.5 - 5.1 mmol/L 3.8  2.9  3.1   Chloride 98 - 111 mmol/L 104  103  99   CO2 22 - 32 mmol/L _0 Calcium 8.9 - 10.3 mg/dL 9.4  8.6  8.9     General: AAOx3, NAD  Lower Extremity Exam Lower Extremity Exam Vasc:     R - PT palpable, DP palpable.                L - PT 1/4 palpable, DP 1/4  palpable. Both diminished related to edema of the L foot. Cap refill <3 sec to digits   Derm:    R - Healed TMA no open wound               L - Large 3-4 cm ulceration with fibrotic necrotic tissue in the base, no active draiange, edema and erythema of the midfoot.       MSK:     R - S/p TMA 6 yrs ago well healed               L - Slight varus deformity of the foot at rest   Neuro:   R - Gross sensation diminisehd. Gross motor function intact                 L - Gross sensation diminished. Gross motor function intact   Radiology:  Results reviewed. See assessment for pertinent imaging results

## 2022-11-07 NOTE — Brief Op Note (Signed)
11/07/2022  12:39 PM  PATIENT:  Joshua [name]  66 y.o. male  PRE-OPERATIVE DIAGNOSIS:  Osteomyelitis left foot  POST-OPERATIVE DIAGNOSIS:  Osteomyelitis left foot  PROCEDURE:  Procedure(s) with comments: IRRIGATION AND DEBRIDEMENT WOUND (Left) - Left foot ucler debridement and bone biopsy  SURGEON:  Surgeon(s) and Role:    * Denay Pleitez F, DPM - Primary  PHYSICIAN ASSISTANT: none  ASSISTANTS: none   ANESTHESIA:   local and IV sedation  EBL:  20 mL   BLOOD ADMINISTERED:none  DRAINS: none   LOCAL MEDICATIONS USED:  MARCAINE     SPECIMEN:  Ulcer L foot for culture, Left foot deep culture, Bone 5th metatarsal base for culture, bone 5th met base for pathology  DISPOSITION OF SPECIMEN:   Micro and Pathology  COUNTS:  YES  TOURNIQUET:  * No tourniquets in log *  DICTATION: .Note written in EPIC  PLAN OF CARE:  No further surgical plans, will need to await culture data for IV abx plan for long term, ID consult . Wound vac to be changed first Wednesday 11/29.   PATIENT DISPOSITION:  PACU - hemodynamically stable.   Delay start of Pharmacological VTE agent (>24hrs) due to surgical blood loss or risk of bleeding: no

## 2022-11-07 NOTE — Anesthesia Preprocedure Evaluation (Addendum)
Anesthesia Evaluation  Patient identified by MRN, date of birth, ID band Patient awake    Reviewed: Allergy & Precautions, NPO status , Patient's Chart, lab work & pertinent test results  Airway Mallampati: III  TM Distance: >3 FB Neck ROM: Full    Dental  (+) Chipped,    Pulmonary sleep apnea and Continuous Positive Airway Pressure Ventilation , COPD   Pulmonary exam normal        Cardiovascular hypertension, Pt. on home beta blockers + CAD, + Past MI, + Cardiac Stents and +CHF  Normal cardiovascular exam     Neuro/Psych  PSYCHIATRIC DISORDERS  Depression     Neuromuscular disease    GI/Hepatic PUD,GERD  Medicated and Controlled,,(+)     substance abuse    Endo/Other  diabetes, Insulin Dependent, Oral Hypoglycemic Agents    Renal/GU Renal disease     Musculoskeletal  (+) Arthritis ,  Gout   Abdominal   Peds  Hematology  (+) Blood dyscrasia, anemia   Anesthesia Other Findings Osteomyelitis left foot  Reproductive/Obstetrics                             Anesthesia Physical Anesthesia Plan  ASA: 3  Anesthesia Plan: MAC   Post-op Pain Management:    Induction: Intravenous  PONV Risk Score and Plan: 2 and Ondansetron, Propofol infusion, Midazolam and Treatment may vary due to age or medical condition  Airway Management Planned: Simple Face Mask  Additional Equipment:   Intra-op Plan:   Post-operative Plan:   Informed Consent: I have reviewed the patients History and Physical, chart, labs and discussed the procedure including the risks, benefits and alternatives for the proposed anesthesia with the patient or authorized representative who has indicated his/her understanding and acceptance.     Dental advisory given  Plan Discussed with: CRNA  Anesthesia Plan Comments:        Anesthesia Quick Evaluation

## 2022-11-07 NOTE — Transfer of Care (Signed)
Immediate Anesthesia Transfer of Care Note  Patient: Joshua [name]  Procedure(s) Performed: IRRIGATION AND DEBRIDEMENT WOUND (Left)  Patient Location: PACU  Anesthesia Type:MAC  Level of Consciousness: awake, alert , and oriented  Airway & Oxygen Therapy: Patient Spontanous Breathing  Post-op Assessment: Report given to RN and Post -op Vital signs reviewed and stable  Post vital signs: Reviewed and stable  Last Vitals:  Vitals Value Taken Time  BP 128/79 11/07/22 1413  Temp 36.7 C 11/07/22 1413  Pulse 89 11/07/22 1413  Resp 17 11/07/22 1413  SpO2 100 % 11/07/22 1413    Last Pain:  Vitals:   11/07/22 1413  TempSrc: Oral  PainSc:       Patients Stated Pain Goal: 0 (73/42/87 6811)  Complications: No notable events documented.

## 2022-11-07 NOTE — Op Note (Signed)
Full Operative Report  Date of Operation: 12:42 PM, 11/07/2022   Patient: Joshua [name] - 66 y.o. male  Surgeon: Yevonne Pax, DPM   Assistant: None  Diagnosis: Osteomyelitis left foot, 5th metatarsal base  Procedure:  1. Wound debridement to level of bone, 4x4 cm, left foot 2. Bone biopsy 5th metatarsal base, left foot 3. Application of integra graft, 4x4.5x1 cm, left foot 4. Application of negative pressure wound therapy device, 4x4.5x1cm, left foot    Anesthesia: Monitor Anesthesia Care  [name], [name], MD  Anesthesiologist: Murvin Natal, MD CRNA: Inda Coke, CRNA   Estimated Blood Loss: Minimal   Hemostasis: 1) Anatomical dissection, mechanical compression, electrocautery 2) No tourniquet was used during the procedure   Implants: Implant Name Type Inv. Item Serial No. Manufacturer Lot No. LRB No. Used Action  WOUND MATRIX MESHED 2X2 - [id number] Tissue WOUND MATRIX MESHED 2X2  [employer] [id number] Left 1 Implanted    Materials: skin staples  Injectables: 1) Pre-operatively: 10 cc of 0.5% marcaine plain 2) Post-operatively: None  Specimens: Ulcer L foot for culture, Left foot deep culture, Bone 5th metatarsal base for culture, bone 5th met base for pathology   Antibiotics: Abx given as scheduled from floor, holding to increase culture data.   Drains: None  Complications: Patient tolerated the procedure well without complication.   Findings: as below  Indications for Procedure: Joshua [name] presents to [facility], [place] with a chief complaint of chronic ulceration to the lateral aspect of the Left midfoot overlying the 5th met base.  MRI of the left foot concerning for osteomyelitis of the fifth metatarsal base.  The patient has failed conservative treatments of various modalities. At this time the patient has elected to proceed with surgical correction. All alternatives, risks, and complications of the procedures  were thoroughly explained to the patient. Patient exhibits appropriate understanding of all discussion points and informed consent was signed and obtained in the chart with no guarantees to surgical outcome given or implied.  Description of Procedure: Patient was brought to the operating room and  remained on hospital bed in supine position. Patient was secured to the table with safety belt, a contralateral SCD was placed, and all bony prominences were well padded. A surgical timeout was performed and all members of the operating room, the procedure, and the surgical site were identified.  Local with MAC anesthesia occurred. Local anesthetic as previously described was then injected about the operative field in a local infiltrative block.   The left lower extremity was then prepped and draped in the usual sterile manner. The following procedure then began.  Attention was directed to the large necrotic ulceration present at the lateral aspect of the fifth metatarsal base on the left foot.  The wound was completely inspected. The wound base had necrotic and fibrotic tissue present throughout.  The wound measured 4 x 4 x 0.2 cm predebridement. The wound was then debrided excisionally with a 15 blade scalpel as well as rongeur with removal of fibrotic and necrotic tissue to the level of the fifth metatarsal base. The wound margins were excised with 15 blade scalpel.  The ulceration did probe to the fifth metatarsal base.    Next, a rongeur was used to perform open bone biopsy of the fifth metatarsal base.  The rongeur was used to collect bone from the base of the fifth metatarsal which was sent in 2 separate specimen containers 1 for culture and 1 for pathology.  The wound was then irrigated with 3L of saline with power pulse lavage. The wound measured 4 x 4.5 x 1 cm. Hemostasis was achieved with electrocautery.  Next, to promote healing over the exposed bone decision was made to utilize and Integra graft.   The Integra graft was then placed within the wound bed, covering the entire wound. This was then affixed with surgical staples.  Next, a wound VAC black sponge was cut to the size of the wound postdebridement and covered with wound VAC drape.  The wound VAC was turned on and set to suction with no leaks noted and was set to 125 mmHg continuous medium intensity suction.  The surgical site was then dressed with 4 x 4 Kerlix Ace. The patient tolerated both the procedure and anesthesia well with vital signs stable throughout. The patient was transferred from the OR to recovery under the discretion of anesthesia.  Condition: Patient was taken to PACU in good condition and all vital signs stable and neurovascular status intact to the operative limb.  Discharge: Patient will be readmitted to the floors for ongoing wound care wound VAC changes.  We will plan to follow cultures taken intraoperatively from the fifth metatarsal base.  Patient will need long-term IV antibiotics for osteomyelitis of the fifth met base.  The patient will be weightbearing in a postop shoe to the operative limb until further instructed. The dressing is to remain clean, dry, and intact.   Ames Coupe, DPM

## 2022-11-07 NOTE — Progress Notes (Signed)
Pt wound vac was operating on battery power when started shift.  Low battery warning alerted and plugged wound vac into outlet,  Wound vac not charging with cord.  Another wound vac was ordered from central supply per charge nurse (Kem-mar).

## 2022-11-07 NOTE — Progress Notes (Signed)
[facility] Progress Note  Patient: Joshua [name]     HLZ:[id number]  DOA: 11/05/2022   PCP: Elias Else, MD       Brief hospital course: This is a 66 year old male with heart failure with preserved EF, CAD status post PCI and stenting, CKD stage IV, diabetes mellitus type 2, COPD, hypertension, obesity, prior right transmetatarsal amputation who presents to the hospital for an evaluation of a left foot wound. The patient states that the wound began as a callus about 2 months ago.  He was evaluated by podiatry and the callus was shaved off.  He subsequently had an infection in that area and was placed on oral antibiotics.  The wound has since not closed up and this past week the pain increased in his foot.  He therefore presented to the ED for an evaluation.  Subjective:  He has no complaints today.   Assessment and Plan: Principal Problem: Wound on left lateral aspect of the left foot-osteomyelitis of fifth toe of left foot  ? - he has undergone an bone biopsy today and had a wound vac - His social situation may make it difficult for this wound to heal-he is partly homeless and has been living in a room that he shares with a roommate.  He has no access to nutritious food and has been going to his son's home to shower and keep the wound clean.  Active Problems:   CAD S/P multiple PCIs   (HFpEF) heart failure with preserved ejection fraction (HCC) -Last echo in 9/22 revealed grade 2 diastolic dysfunction, moderate dilatation of the left atrium and an EF of 55 to 60% - Continue Brilinta, furosemide, aspirin, Lipitor    CKD (chronic kidney disease), stage IV (HCC) -GFR ranges between 29-32  Hypokalemia - Secondary to furosemide - Continue to replace    Insulin dependent type 2 diabetes mellitus (HCC) -The patient states that due to his poor oral intake over the past couple of months, he has not had to take as much insulin as he was previously taking - A1c is 7.6 -  Continue NovoLog sliding scale and Farxiga  Morbid obesity Body mass index is 38.95 kg/m.       Code Status: Full Code Consultants: Podiatry, ID Level of Care: Level of care: Med-Surg Total time on patient care: 30 minutes DVT prophylaxis: heparin  Objective:   Vitals:   11/07/22 1315 11/07/22 1330 11/07/22 1345 11/07/22 1413  BP: (!) 146/77 134/73 128/71 128/79  Pulse: 86 85 84 89  Resp: 12 16 13 17   Temp:    98 F (36.7 C)  TempSrc:    Oral  SpO2: 96% 97% 96% 100%  Weight:      Height:       Filed Weights   11/06/22 0327 11/07/22 0454 11/07/22 1107  Weight: 129 kg 119.7 kg 119.7 kg   Exam: General exam: Appears comfortable  HEENT: oral mucosa moist Respiratory system: Clear to auscultation.  Cardiovascular system: S1 & S2 heard  Gastrointestinal system: Abdomen soft, non-tender, nondistended. Normal bowel sounds   Extremities: No cyanosis, clubbing or edema-   wound vac present on left foot Psychiatry:  Mood & affect appropriate.       CBC: Recent Labs  Lab 11/05/22 1643 11/06/22 0514  WBC 12.5* 9.0  NEUTROABS 9.6*  --   HGB 12.6* 12.1*  HCT 39.8 38.6*  MCV 87.3 87.9  PLT 272 245    Basic Metabolic Panel: Recent Labs  Lab 11/05/22 1643  11/06/22 0514 11/06/22 0800 11/07/22 0430  NA 135 136  --  139  K 3.1* 2.9*  --  3.8  CL 99 103  --  104  CO2 22 21*  --  23  GLUCOSE 214* 101*  --  143*  BUN 30* 29*  --  32*  CREATININE 2.44* 2.21*  --  2.14*  CALCIUM 8.9 8.6*  --  9.4  MG  --   --  2.1  --     GFR: Estimated Creatinine Clearance: 44.5 mL/min (A) (by C-G formula based on SCr of 2.14 mg/dL (H)).  Scheduled Meds:  ascorbic acid  500 mg Oral BID   aspirin EC  81 mg Oral Daily   atorvastatin  80 mg Oral Daily   dapagliflozin propanediol  10 mg Oral Daily   furosemide  80 mg Oral BID   gabapentin  100 mg Oral QHS   heparin  5,000 Units Subcutaneous Q8H   insulin aspart  0-20 Units Subcutaneous TID WC   insulin aspart  0-5 Units  Subcutaneous QHS   isosorbide mononitrate  180 mg Oral Daily   metoprolol succinate  75 mg Oral Daily   multivitamin with minerals  1 tablet Oral Daily   mupirocin ointment  1 Application Nasal BID   nutrition supplement (JUVEN)  1 packet Oral BID BM   pantoprazole  40 mg Oral BID   ticagrelor  90 mg Oral BID   Continuous Infusions: Imaging and lab data was personally reviewed MR FOOT LEFT W WO CONTRAST  Result Date: 11/06/2022 CLINICAL DATA:  Foot pain and swelling. EXAM: MRI OF THE LEFT FOREFOOT WITHOUT AND WITH CONTRAST TECHNIQUE: Multiplanar, multisequence MR imaging of the left foot was performed both before and after administration of intravenous contrast. CONTRAST:  59mL GADAVIST GADOBUTROL 1 MMOL/ML IV SOLN COMPARISON:  Radiographs, same date. FINDINGS: Open wound noted along the lateral aspect of the foot near the base of the fifth metatarsal. A remote avulsion fracture is noted. Diffuse abnormal T1 and T2 signal intensity in the fifth metatarsal base and subsequent abnormal contrast enhancement consistent with osteomyelitis. No findings for septic arthritis. Diffuse cellulitis and myofasciitis but no discrete drainable soft tissue abscess. Moderate age advanced midfoot degenerative changes with scattered erosive findings. Neuropathic disease versus inflammatory arthropathy. The tibiotalar and subtalar joints are maintained. The major tendons ligaments are intact. IMPRESSION: 1. Open wound along the lateral aspect of the foot near the base of the fifth metatarsal with underlying osteomyelitis involving the fifth metatarsal base. 2. Diffuse cellulitis and myofasciitis but no discrete drainable soft tissue abscess. 3. Moderate age advanced midfoot degenerative changes with scattered erosive findings. Neuropathic disease versus inflammatory arthropathy. Electronically Signed   By: Marijo Sanes M.D.   On: 11/06/2022 08:58   DG Foot Complete Left  Result Date: 11/05/2022 CLINICAL DATA:  Pain  and diabetic foot ulcer EXAM: LEFT FOOT - COMPLETE 3+ VIEW COMPARISON:  None Available. FINDINGS: Chronic avulsion fracture of the base of the fifth metatarsal. There is cortical irregularity with loss of cortical definition on both sides of the fracture fragments about the fracture line. No acute fracture or dislocation. Soft tissue swelling about the fifth metatarsal base with laceration/ulcer about the lateral aspect of the foot at the fifth metatarsal base. Soft tissue swelling about the dorsum of the foot. Degenerative arthritis about the midfoot. IMPRESSION: Findings concerning for osteomyelitis about the chronic avulsion fracture of the fifth metatarsal base. Consider MRI for further evaluation. Adjacent soft tissue ulcer  about the fifth metatarsal with soft tissue swelling about the lateral and dorsal foot. Electronically Signed   By: Placido Sou M.D.   On: 11/05/2022 17:23    LOS: 1 day   Author: Debbe Odea  11/07/2022 3:12 PM  To contact [facility]>   Check the care team in [facility] and look for the attending/consulting [place] provider listed  Log into [email or web address] and use [place] universal password   Go to> "[facility]"  and find provider  If you still have difficulty reaching the provider, please page the [facility] (Director on Call) for the Hospitalists listed on amion

## 2022-11-07 NOTE — Anesthesia Postprocedure Evaluation (Signed)
Anesthesia Post Note  Patient: Joshua [name]  Procedure(s) Performed: IRRIGATION AND DEBRIDEMENT WOUND (Left)     Patient location during evaluation: PACU Anesthesia Type: MAC Level of consciousness: awake Pain management: pain level controlled Vital Signs Assessment: post-procedure vital signs reviewed and stable Respiratory status: spontaneous breathing, nonlabored ventilation and respiratory function stable Cardiovascular status: stable and blood pressure returned to baseline Postop Assessment: no apparent nausea or vomiting Anesthetic complications: no   No notable events documented.  Last Vitals:  Vitals:   11/07/22 1413 11/07/22 1625  BP: 128/79 135/78  Pulse: 89 88  Resp: 17 18  Temp: 36.7 C 36.9 C  SpO2: 100% 99%    Last Pain:  Vitals:   11/07/22 1625  TempSrc: Oral  PainSc:                  Javid Kemler P Colonel Krauser

## 2022-11-07 NOTE — Progress Notes (Signed)
Orthopedic Tech Progress Note Patient Details:  Joshua [name] August 31, 1956 [id number]   Ortho Devices Type of Ortho Device: Postop shoe/boot Ortho Device/Splint Location: LLE Ortho Device/Splint Interventions: Ordered, Application, Adjustment   Post Interventions Patient Tolerated: Well Instructions Provided: Care of device, Adjustment of device  [name] Jeri Modena 11/07/2022, 5:45 PM

## 2022-11-07 NOTE — Plan of Care (Signed)

## 2022-11-07 NOTE — Anesthesia Procedure Notes (Signed)
Procedure Name: MAC Date/Time: 11/07/2022 11:55 AM  Performed by: Inda Coke, CRNAPre-anesthesia Checklist: Patient identified, Emergency Drugs available, Suction available, Timeout performed and Patient being monitored Patient Re-evaluated:Patient Re-evaluated prior to induction Oxygen Delivery Method: Simple face mask Induction Type: IV induction Dental Injury: Teeth and Oropharynx as per pre-operative assessment

## 2022-11-08 ENCOUNTER — Encounter: Payer: Self-pay | Admitting: Physician Assistant

## 2022-11-08 ENCOUNTER — Other Ambulatory Visit: Payer: Self-pay | Admitting: *Deleted

## 2022-11-08 ENCOUNTER — Encounter (HOSPITAL_COMMUNITY): Payer: Self-pay | Admitting: Podiatry

## 2022-11-08 ENCOUNTER — Ambulatory Visit: Payer: Medicare Other

## 2022-11-08 DIAGNOSIS — M869 Osteomyelitis, unspecified: Secondary | ICD-10-CM | POA: Diagnosis not present

## 2022-11-08 DIAGNOSIS — E43 Unspecified severe protein-calorie malnutrition: Secondary | ICD-10-CM | POA: Insufficient documentation

## 2022-11-08 LAB — BASIC METABOLIC PANEL
Anion gap: 10 (ref 5–15)
BUN: 36 mg/dL — ABNORMAL HIGH (ref 8–23)
CO2: 24 mmol/L (ref 22–32)
Calcium: 8.8 mg/dL — ABNORMAL LOW (ref 8.9–10.3)
Chloride: 102 mmol/L (ref 98–111)
Creatinine, Ser: 2.68 mg/dL — ABNORMAL HIGH (ref 0.61–1.24)
GFR, Estimated: 26 mL/min — ABNORMAL LOW (ref >60)
Glucose, Bld: 166 mg/dL — ABNORMAL HIGH (ref 70–99)
Potassium: 3.7 mmol/L (ref 3.5–5.1)
Sodium: 136 mmol/L (ref 135–145)

## 2022-11-08 LAB — GLUCOSE, CAPILLARY
Glucose-Capillary: 165 mg/dL — ABNORMAL HIGH (ref 70–99)
Glucose-Capillary: 207 mg/dL — ABNORMAL HIGH (ref 70–99)
Glucose-Capillary: 209 mg/dL — ABNORMAL HIGH (ref 70–99)
Glucose-Capillary: 215 mg/dL — ABNORMAL HIGH (ref 70–99)
Glucose-Capillary: 267 mg/dL — ABNORMAL HIGH (ref 70–99)

## 2022-11-08 LAB — PREALBUMIN: Prealbumin: 6 mg/dL — ABNORMAL LOW (ref 18–38)

## 2022-11-08 MED ORDER — GLUCERNA SHAKE PO LIQD
237.0000 mL | Freq: Three times a day (TID) | ORAL | Status: DC
  Administered 2022-11-08 – 2022-11-14 (×16): 237 mL via ORAL

## 2022-11-08 MED ORDER — LATANOPROST 0.005 % OP SOLN
1.0000 [drp] | Freq: Every day | OPHTHALMIC | Status: DC
  Administered 2022-11-10 – 2022-11-15 (×6): 1 [drp] via OPHTHALMIC
  Filled 2022-11-08 (×2): qty 2.5

## 2022-11-08 NOTE — Progress Notes (Signed)
Patient using hospital CPAP independently. Told patient to call if he had questions.

## 2022-11-08 NOTE — Progress Notes (Signed)
Initial Nutrition Assessment  DOCUMENTATION CODES:   Severe malnutrition in context of social or environmental circumstances  INTERVENTION:  - Continue Juven BID.   -Add Glucerna Shake po TID, each supplement provides 220 kcal and 10 grams of protein  NUTRITION DIAGNOSIS:   Severe Malnutrition related to social / environmental circumstances as evidenced by energy intake < or equal to 50% for > or equal to 1 month, percent weight loss.  GOAL:   Patient will meet greater than or equal to 90% of their needs  MONITOR:   PO intake, Supplement acceptance  REASON FOR ASSESSMENT:   Malnutrition Screening Tool    ASSESSMENT:   66 y.o. male admits related to left foot wound. PMH includes: HFpEF, CAD, CKD stage IV, COPD, T2DM, HTN, HLD, OSA on CPAP.  Meds include: Vit C, sliding scale insulin, MVI, Juven BID. Labs reviewed: BUN/Crt elevated.   The pt reports that he is currently living in a house with no access to a refrigerator or any appliances to cook food. He states that he has been driving to his son's house to wash his wound. He states that he typically only eats one meal per day, if that, most days. He reports that he does have a car and access to get to a grocery store.   MD also present at time of assessment. RD and MD discussed with pt different affordable ways to get good sources of protein in throughout the day. Pt verbalized understanding. RD will add protein shakes for pt to help him get added protein.    Per record, pt has experienced a 14.7% wt loss in the last 6 months. Pt qualifies for Severe Malnutrition in setting of Social and Environmental circumstances.   NUTRITION - FOCUSED PHYSICAL EXAM:  WDL.   Diet Order:   Diet Order             Diet renal/carb modified with fluid restriction Diet-HS Snack? Nothing; Fluid restriction: 1200 mL Fluid; Room service appropriate? Yes; Fluid consistency: Thin  Diet effective now                   EDUCATION NEEDS:    Education needs have been addressed  Skin:  Skin Assessment: Skin Integrity Issues: Skin Integrity Issues:: Other (Comment) Other: Left foot wound  Last BM:  11/05/22  Height:   Ht Readings from Last 1 Encounters:  11/07/22 5' 9.02" (1.753 m)    Weight:   Wt Readings from Last 1 Encounters:  11/08/22 119.5 kg    Ideal Body Weight:  72.7 kg  BMI:  Body mass index is 38.89 kg/m.  Estimated Nutritional Needs:   Kcal:  3570-1779 kcals  Protein:  110-135 gm  Fluid:  >/= 1.8 L  Thalia Bloodgood, RD, LDN, CNSC

## 2022-11-08 NOTE — TOC Initial Note (Signed)
Transition of Care [facility]) - Initial/Assessment Note    Patient Details  Name: Joshua [name] MRN: [id number] Date of Birth: Apr 15, 1956  [facility] CM/SW Contact:    Curlene Labrum, RN Phone Number: 11/08/2022, 2:21 PM  Clinical Narrative:                 CM met with the patient at the bedside to discuss transitions of care needs.  The patient currently lives in a room at a community housing project that provides a room for shelter.  The facility has bathrooms that are not located nearby to the bedroom.  No running water is available near to his sleeping quarters at the apartment.  The patient has a present Left foot wound vac and will need a wound vac for continued wound care needs.  The patient states that he plans to move in with his son in the next few weeks after his son moves to a new apartment.  The patient is estranged from his ex-wife at this time and receives disability check for 2020.00 per month for monthly expenses.  The patient currently drives and has a car for transportation.  I spoke with the attending physician and podiatry and it is recommended that patient go to a SNF facility for wound care.  PT/OT will be ordered.  The patient was agreeable for SNF work up and admission.  Bed offers will be made to the patient once available tomorrow and insurance authorization will be started once bed selection is made.  The SNF facility will provide the wound vac.  CM will continue to follow the patient for SNF placement and wound care needs (including wound vac)  Expected Discharge Plan: Skilled Nursing Facility Barriers to Discharge: Continued Medical Work up, Homeless with medical needs   Patient Goals and CMS Choice Patient states their goals for this hospitalization and ongoing recovery are:: To return to his shared room with a roommate CMS Medicare.gov Compare Post Acute Care list provided to:: Patient Choice offered to / list presented to :  Patient  Expected Discharge Plan and Services Expected Discharge Plan: [place]   Discharge Planning Services: CM Consult   Living arrangements for the past 2 months: Homeless (currently staying with a roommate in community housing room with no running water)                                      Prior Living Arrangements/Services Living arrangements for the past 2 months: Homeless (currently staying with a roommate in [place] room with no running water) Lives with:: Self, Presenter, broadcasting (Lives in community near [place] [place], [place]) Patient language and need for interpreter reviewed:: Yes Do you feel safe going back to the place where you live?: Yes      Need for Family Participation in Patient Care: Yes (Comment) Care giver support system in place?: Yes (comment) Current home services: DME Criminal Activity/Legal Involvement Pertinent to Current Situation/Hospitalization: No - Comment as needed  Activities of Daily Living Home Assistive Devices/Equipment: Cane (specify quad or straight) ADL Screening (condition at time of admission) Patient's cognitive ability adequate to safely complete daily activities?: Yes Is the patient deaf or have difficulty hearing?: No Does the patient have difficulty seeing, even when wearing glasses/contacts?: No Does the patient have difficulty concentrating, remembering, or making decisions?: No Patient able to express need for assistance with ADLs?:  Yes Does the patient have difficulty dressing or bathing?: No Independently performs ADLs?: Yes (appropriate for developmental age) Does the patient have difficulty walking or climbing stairs?: No Weakness of Legs: None Weakness of Arms/Hands: None  Permission Sought/Granted Permission sought to share information with : Case Manager, Family Supports       Permission granted to share info w AGENCY: SNF facility  Permission granted to share info w  Relationship: family contacts     Emotional Assessment Appearance:: Appears stated age Attitude/Demeanor/Rapport: Gracious Affect (typically observed): Accepting Orientation: : Oriented to Self, Oriented to Place, Oriented to  Time, Oriented to Situation Alcohol / Substance Use: Not Applicable Psych Involvement: No (comment)  Admission diagnosis:  Osteomyelitis ([place]) [M86.9] Osteomyelitis of left foot, unspecified type ([place]) [M86.9] Osteomyelitis of fifth toe of left foot ([place]) [M86.9] Patient Active Problem List   Diagnosis Date Noted   Protein-calorie malnutrition, severe 11/08/2022   Osteomyelitis ([place]) 11/06/2022   Diabetic foot infection ([place]) 11/06/2022   Osteomyelitis of fifth toe of left foot ([place]) 11/05/2022   (HFpEF) heart failure with preserved ejection fraction ([place]) 11/05/2022   CKD (chronic kidney disease), stage IV ([place]) 11/05/2022   Dysphagia 03/16/2022   History of colonic polyps 03/16/2022   Spinal stenosis of lumbar region 11/03/2021   Mixed diabetic hyperlipidemia associated with type 2 diabetes mellitus ([place]) 08/11/2021   Chronic low back pain    Hypokalemia 09/25/2020   Insulin dependent type 2 diabetes mellitus ([place]) 09/24/2020   Unstable angina ([place]) 08/17/2019   Chest pain 08/16/2019   NSTEMI (non-ST elevated myocardial infarction) ([place]) 04/06/2019   ACS (acute coronary syndrome) ([place]) 04/06/2019   Sepsis ([place]) 03/15/2018   DKA (diabetic ketoacidoses) 03/15/2018   Special screening for malignant neoplasms, colon 08/22/2017   Acute renal failure superimposed on stage 3 chronic kidney disease ([place]) 05/19/2017   Hypertension associated with diabetes ([place])    Status post transmetatarsal amputation of foot, right ([place]) 11/08/2016   Diabetic polyneuropathy associated with type 2 diabetes mellitus ([place]) 11/08/2016   Pure hypercholesterolemia    AKI (acute kidney injury) ([place])    Acute blood loss anemia    DM type 2 with diabetic peripheral neuropathy ([place])     Physical debility 08/03/2016   Chronic osteomyelitis of right foot ([place])    Cellulitis and abscess of leg, except foot    Penetrating wound of right foot    CKD (chronic kidney disease) stage 3, GFR 30-59 ml/min (HCC)    Unstable angina pectoris ([place]) 07/18/2016   Chronic combined systolic and diastolic congestive heart failure (HCC)    Chest pain with moderate risk for cardiac etiology 06/24/2014   CAD S/P multiple PCIs 06/24/2014   Sleep apnea- on C-pap 06/24/2014   Class 3 severe obesity with serious comorbidity and body mass index (BMI) of 45.0 to 49.9 in adult [facility]) 11/10/2013   Hyperglycemia 10/29/2013   Hyperlipidemia 01/12/2011   Benign essential HTN 01/12/2011   GERD 01/12/2011   PCP:  [name], MD Pharmacy:   [facility] [address] [place] [address] Phone: [phone] Fax: [phone]  [place], [place] - [address] [place] [address] Phone: [phone] Fax: [phone]     Social Determinants of Health (SDOH) Interventions    Readmission Risk Interventions    11/08/2022    2:21 PM  Readmission Risk Prevention Plan  Transportation Screening Complete  PCP or Specialist Appt within 5-7  Days Complete  Home Care Screening Complete  Medication Review (RN CM) Complete

## 2022-11-08 NOTE — Evaluation (Signed)
Physical Therapy Evaluation and Discharge Patient Details Name: Joshua [name] MRN: [id number] DOB: [birth year] Today's Date: 11/08/2022  History of Present Illness  Pt is a 66 y.o. M who presents for evaluation of left foot wound now s/p debridement 11/07/2022. Significant PMH: HF, CAD s/p PCI, CKD stage IV, DM2, COPD, HTN, obesity, prior right TMA.  Clinical Impression  PTA, pt lives in a community housing shelter, is modI with a cane, and independent with ADL's. Pt presents with increased LLE edema, pain, and mild gait abnormalities. Pt ambulating 200 ft with a cane modI with post op shoe donned. Education provided regarding elevation and activity/nutritional recommendations. Pt will benefit from SNF for follow up wound care/management. No further acute PT needs. Thank you for this consult.     Recommendations for follow up therapy are one component of a multi-disciplinary discharge planning process, led by the attending physician.  Recommendations may be updated based on patient status, additional functional criteria and insurance authorization.  Follow Up Recommendations No PT follow up (will benefit from SNF for wound care) Can patient physically be transported by private vehicle: Yes    Assistance Recommended at Discharge Set up Supervision/Assistance  Patient can return home with the following  Other (comment) (assist for wound care)    Equipment Recommendations None recommended by PT  Recommendations for Other Services       Functional Status Assessment Patient has had a recent decline in their functional status and demonstrates the ability to make significant improvements in function in a reasonable and predictable amount of time.     Precautions / Restrictions Precautions Precautions: Fall Required Braces or Orthoses: Other Brace Other Brace: R post op shoe Restrictions Weight Bearing Restrictions: Yes LLE Weight Bearing: Weight bearing as tolerated      Mobility   Bed Mobility               General bed mobility comments: OOB in chair    Transfers Overall transfer level: Modified independent Equipment used: Straight cane                    Ambulation/Gait Ambulation/Gait assistance: Modified independent (Device/Increase time) Gait Distance (Feet): 200 Feet Assistive device: Straight cane Gait Pattern/deviations: Step-through pattern, Decreased stance time - right, Decreased weight shift to right Gait velocity: decreased     General Gait Details: ModI for level surface negotiation, no overt LOB, slower pace  Stairs            Wheelchair Mobility    Modified Rankin (Stroke Patients Only)       Balance                                             Pertinent Vitals/Pain Pain Assessment Pain Assessment: Faces Faces Pain Scale: Hurts little more Pain Location: L foot Pain Descriptors / Indicators: Operative site guarding Pain Intervention(s): Monitored during session    Home Living Family/patient expects to be discharged to:: Shelter/Homeless                   Additional Comments: Pt lives in community housing project with lighting and heat; community bathroom 30 yards away, no running water or outlets in his shelter. Pt does have a car. Has been showering at son's house.    Prior Function Prior Level of Function : Independent/Modified Independent  Hand Dominance        Extremity/Trunk Assessment   Upper Extremity Assessment Upper Extremity Assessment: Overall WFL for tasks assessed    Lower Extremity Assessment Lower Extremity Assessment: LLE deficits/detail;RLE deficits/detail RLE Deficits / Details: prior TMA, hip/knee WFL LLE Deficits / Details: L dorsal foot wound, hip/knee WFL. increased edema    Cervical / Trunk Assessment Cervical / Trunk Assessment: Normal  Communication   Communication: No difficulties  Cognition Arousal/Alertness:  Awake/alert Behavior During Therapy: WFL for tasks assessed/performed Overall Cognitive Status: Within Functional Limits for tasks assessed                                          General Comments      Exercises     Assessment/Plan    PT Assessment Patient does not need any further PT services  PT Problem List         PT Treatment Interventions      PT Goals (Current goals can be found in the Care Plan section)  Acute Rehab PT Goals Patient Stated Goal: for wound to heal PT Goal Formulation: All assessment and education complete, DC therapy    Frequency       Co-evaluation               AM-PAC PT "6 Clicks" Mobility  Outcome Measure Help needed turning from your back to your side while in a flat bed without using bedrails?: None Help needed moving from lying on your back to sitting on the side of a flat bed without using bedrails?: None Help needed moving to and from a bed to a chair (including a wheelchair)?: None Help needed standing up from a chair using your arms (e.g., wheelchair or bedside chair)?: None Help needed to walk in hospital room?: None Help needed climbing 3-5 steps with a railing? : A Little 6 Click Score: 23    End of Session   Activity Tolerance: Patient tolerated treatment well Patient left: in chair;with call bell/phone within reach   PT Visit Diagnosis: Other abnormalities of gait and mobility (R26.89);Pain Pain - Right/Left: Left Pain - part of body: Ankle and joints of foot    Time: 7062-3762 PT Time Calculation (min) (ACUTE ONLY): 24 min   Charges:   PT Evaluation $PT Eval Low Complexity: 1 Low PT Treatments $Therapeutic Activity: 8-22 mins        Joshua [name], PT, DPT Acute Rehabilitation Services Office [phone]   Joshua [name] 11/08/2022, 5:14 PM

## 2022-11-08 NOTE — Consult Note (Signed)
[facility] Nurse Consult Note: Reason for Consult: Left lateral midfoot.  Area began as a callous and has not healed. VAC had leaked overnight and machine is not functioning at this time.   Wound type: neuropathic wound to left foot.   Pressure Injury POA: NA Measurement: 6 cm x 6 cm pared open wound. Skin substitute to wound bed.  Wound bed: Skin substitute Drainage (amount, consistency, odor) minimal bloody effluent  musty odor gelatinous effluent to wound bed.  Periwound:dry skin  staples to periwound holding skin substitute in place Dressing procedure/placement/frequency: Cleanse wound bed with NS and pat dry.  Apply skin prep and drape to intact skin dorsal foot to create bridge.  Fill woundbed with black foam and bridge to dorsal foot.  COver with drape and seal immediately achieved at 125 mmHg. Change Mon.Wed.Fri Will not follow at this time.  Please re-consult if needed.  Estrellita Ludwig MSN, RN, FNP-BC CWON Wound, Ostomy, Continence Nurse [facility] [phone] Pager [phone]

## 2022-11-08 NOTE — Telephone Encounter (Signed)
   Name: Joshua [name]  DOB: [birth year]  MRN: [id number]  Primary Cardiologist: [name], MD  Chart reviewed as part of pre-operative protocol coverage. Because of Joshua [name]'s past medical history and time since last visit, he will require a follow-up in-office visit in order to better assess preoperative cardiovascular risk. Patient was originally on for virtual visit but has since been admitted for multiple medical issues. Additionally, at last OV 02/2022 it was recommended he f/u in 4-6 months which had not yet been completed. Therefore, will require visit prior to clearing - please arrange after he is discharged - well known to Coletta Memos NP and Dr. Claiborne Billings so would try to schedule with either of them for continuity. Please cancel patient's virtual visit today.  Pre-op covering staff: - Please schedule appointment and call patient to inform them. If patient already had an upcoming appointment within acceptable timeframe, please add "pre-op clearance" to the appointment notes so provider is aware. - I will route this message to GI so they are aware.  This message will also be routed to Dr Claiborne Billings for input on holding Brilinta for colonoscopy as requested below so that this information is available to the clearing provider at time of patient's appointment. Last cath report in 2020 recommended indefinite DAPT given multiple layers of stents, therefore needs MD input (reported hx of  PCI with DES to his LAD and RCA in 2012, DES to LAD and circumflex in 2017, PCI to LAD for ISR 4/20, PCI with DES to, PCI with DES to P LAD 08/2019). Dr. Claiborne Billings - Please route response to P CV DIV PREOP (the pre-op pool). Thank you.  [name], PA-C  11/08/2022, 8:39 AM

## 2022-11-08 NOTE — Progress Notes (Signed)
[facility] Progress Note  Patient: Joshua [name]     BMJ:[id number]  DOA: 11/05/2022   PCP: Elias Else, MD       Brief hospital course: This is a 66 year old male with heart failure with preserved EF, CAD status post PCI and stenting, CKD stage IV, diabetes mellitus type 2, COPD, hypertension, obesity, prior right transmetatarsal amputation who presents to the hospital for an evaluation of a left foot wound. The patient states that the wound began as a callus about 2 months ago.  He was evaluated by podiatry and the callus was shaved off.  He subsequently had an infection in that area and was placed on oral antibiotics.  The wound has since not closed up and this past week the pain increased in his foot.  He therefore presented to the ED for an evaluation.  Subjective:  Complains of pain in his foot today.  Assessment and Plan: Principal Problem: Wound on left lateral aspect of the left foot -osteomyelitis of fifth toe of left foot  ? - he has undergone an bone biopsy and had a wound vac - wound culture "few" growing gr neg rods - His social situation may make it difficult for this wound to heal-he is partly homeless and has been living in a room that he shares with a roommate.  He has no access to nutritious food and has been going to his son's home to shower and keep the wound clean.  Active Problems:   CAD S/P multiple PCIs   (HFpEF) heart failure with preserved ejection fraction (HCC) -Last echo in 9/22 revealed grade 2 diastolic dysfunction, moderate dilatation of the left atrium and an EF of 55 to 60% - Continue Brilinta, furosemide, aspirin, Lipitor    CKD (chronic kidney disease), stage IV (HCC) -GFR ranges between 29-32  Hypokalemia - Secondary to furosemide - Continue to replace as needed    Insulin dependent type 2 diabetes mellitus (HCC) Diabetic neuropathy -The patient states that due to his poor oral intake over the past couple of months, he has not  had to take as much insulin as he was previously taking - has decreased sensation in feet related to diabetic neuropathy - A1c is 7.6 - Continue NovoLog sliding scale and Farxiga  Morbid obesity Body mass index is 38.89 kg/m.       Code Status: Full Code Consultants: Podiatry, ID Level of Care: Level of care: Med-Surg Total time on patient care: 30 minutes DVT prophylaxis: heparin  Objective:   Vitals:   11/07/22 1625 11/07/22 1927 11/08/22 0500 11/08/22 0729  BP: 135/78 124/76  136/78  Pulse: 88 95  88  Resp: 18 18  15   Temp: 98.5 F (36.9 C) 98.1 F (36.7 C)  (!) 97.5 F (36.4 C)  TempSrc: Oral Oral  Oral  SpO2: 99% 98%  99%  Weight:   119.5 kg   Height:       Filed Weights   11/07/22 0454 11/07/22 1107 11/08/22 0500  Weight: 119.7 kg 119.7 kg 119.5 kg   Exam: General exam: Appears comfortable  HEENT: oral mucosa moist Respiratory system: Clear to auscultation.  Cardiovascular system: S1 & S2 heard  Gastrointestinal system: Abdomen soft, non-tender, nondistended. Normal bowel sounds   Extremities: No cyanosis, clubbing - wound on lateral aspect of left foot noted- - he has swelling of the foot Psychiatry:  Mood & affect appropriate.      CBC: Recent Labs  Lab 11/05/22 1643 11/06/22 0514  WBC 12.5*  9.0  NEUTROABS 9.6*  --   HGB 12.6* 12.1*  HCT 39.8 38.6*  MCV 87.3 87.9  PLT 272 374    Basic Metabolic Panel: Recent Labs  Lab 11/05/22 1643 11/06/22 0514 11/06/22 0800 11/07/22 0430 11/08/22 0428  NA 135 136  --  139 136  K 3.1* 2.9*  --  3.8 3.7  CL 99 103  --  104 102  CO2 22 21*  --  23 24  GLUCOSE 214* 101*  --  143* 166*  BUN 30* 29*  --  32* 36*  CREATININE 2.44* 2.21*  --  2.14* 2.68*  CALCIUM 8.9 8.6*  --  9.4 8.8*  MG  --   --  2.1  --   --     GFR: Estimated Creatinine Clearance: 35.5 mL/min (A) (by C-G formula based on SCr of 2.68 mg/dL (H)).  Scheduled Meds:  ascorbic acid  500 mg Oral BID   aspirin EC  81 mg Oral Daily    atorvastatin  80 mg Oral Daily   dapagliflozin propanediol  10 mg Oral Daily   gabapentin  100 mg Oral QHS   heparin  5,000 Units Subcutaneous Q8H   insulin aspart  0-20 Units Subcutaneous TID WC   insulin aspart  0-5 Units Subcutaneous QHS   isosorbide mononitrate  180 mg Oral Daily   [START ON 11/09/2022] latanoprost  1 drop Both Eyes QHS   metoprolol succinate  75 mg Oral Daily   multivitamin with minerals  1 tablet Oral Daily   mupirocin ointment  1 Application Nasal BID   nutrition supplement (JUVEN)  1 packet Oral BID BM   pantoprazole  40 mg Oral BID   ticagrelor  90 mg Oral BID   Continuous Infusions: Imaging and lab data was personally reviewed DG Foot 2 Views Left  Result Date: 11/07/2022 CLINICAL DATA:  Open wound and osteomyelitis, postop. EXAM: LEFT FOOT - 2 VIEW COMPARISON:  11/05/2022 FINDINGS: Lucency at the base of the fifth metatarsal consistent with known osteomyelitis. There is a surgical drain in the soft tissues adjacent to the base of the fifth metatarsal. Osseous structures appear otherwise intact. IMPRESSION: Lucency consistent with known osteomyelitis involving the base of fifth metatarsal with adjacent soft tissue drain in place. Electronically Signed   By: Sammie Bench M.D.   On: 11/07/2022 13:13    LOS: 2 days   Author: Debbe Odea  11/08/2022 10:32 AM  To contact [facility]>   Check the care team in [facility] and look for the attending/consulting [place] provider listed  Log into [email or web address] and use 's universal password   Go to> "[facility]"  and find provider  If you still have difficulty reaching the provider, please page the [facility] (Director on Call) for the Hospitalists listed on amion

## 2022-11-08 NOTE — Consult Note (Signed)
WOC Nurse Consult Note: Patient receiving care in [facility] 2W13. Reason for Consult: "wound vac was not placed properly during surgery yesterday. Needs to be fixed. Please secure chat or call for more info [phone]" I spoke with primary nurse Kathie Dike, and explained that she needs to reach out to the surgical team involving podiatry.  The podiatrist service needs to examine the [facility] dressing and determine next steps. The NPWT order indicates not to remove the [facility] dressing; and, they did not consult the [place] team.  [place] nurse will not follow at this time.  Please re-consult the [place] team if needed.  Val Riles, RN, MSN, CWOCN, CNS-BC, pager [phone]

## 2022-11-08 NOTE — Plan of Care (Signed)

## 2022-11-08 NOTE — Progress Notes (Signed)
[facility] Progress Note  Patient: Joshua [name]     HIJ:[id number]  DOA: 11/05/2022   PCP: Elias Else, MD       Brief hospital course: This is a 67 year old male with heart failure with preserved EF, CAD status post PCI and stenting, CKD stage IV, diabetes mellitus type 2, COPD, hypertension, obesity, prior right transmetatarsal amputation who presents to the hospital for an evaluation of a left foot wound. The patient states that the wound began as a callus about 2 months ago.  He was evaluated by podiatry and the callus was shaved off.  He subsequently had an infection in that area and was placed on oral antibiotics.  The wound has since not closed up and this past week the pain increased in his foot.  He therefore presented to the ED for an evaluation.  Subjective:  Complains of pain in his foot today.  Assessment and Plan: Principal Problem: Wound on left lateral aspect of the left foot -osteomyelitis of fifth toe of left foot  ? - he has undergone an bone biopsy and had a wound vac - wound culture "few" growing gr neg rods - His social situation may make it difficult for this wound to heal-he is homeless and has been living in a room that he shares with a roommate.  He has no access to nutritious food and has been going to his son's home on a daily basis to shower and keep the wound clean  - he has been buying a fast food and trying to make 1 meal last him 3 days and has subsequently lost weight - we have had an extensive discussion today regarding finding healthy nutrition which will ultimately allow better wound healing - cont Juven and dietary supplements  Active Problems:   CAD S/P multiple PCIs   (HFpEF) heart failure with preserved ejection fraction (HCC) -Last echo in 9/22 revealed grade 2 diastolic dysfunction, moderate dilatation of the left atrium and an EF of 55 to 60% - Continue Brilinta, furosemide, aspirin, Lipitor    CKD (chronic kidney disease),  stage IV (HCC) -GFR ranges between 29-32  Hypokalemia - Secondary to furosemide - Continue to replace as needed    Insulin dependent type 2 diabetes mellitus (HCC) Diabetic neuropathy -The patient states that due to his poor oral intake over the past couple of months, he has not had to take as much insulin as he was previously taking - has decreased sensation in feet related to diabetic neuropathy - A1c is 7.6 - Continue NovoLog sliding scale and Farxiga  Morbid obesity Body mass index is 38.89 kg/m.       Code Status: Full Code Consultants: Podiatry, ID Level of Care: Level of care: Med-Surg Total time on patient care: 30 minutes DVT prophylaxis: heparin  Objective:   Vitals:   11/07/22 1625 11/07/22 1927 11/08/22 0500 11/08/22 0729  BP: 135/78 124/76  136/78  Pulse: 88 95  88  Resp: 18 18  15   Temp: 98.5 F (36.9 C) 98.1 F (36.7 C)  (!) 97.5 F (36.4 C)  TempSrc: Oral Oral  Oral  SpO2: 99% 98%  99%  Weight:   119.5 kg   Height:       Filed Weights   11/07/22 0454 11/07/22 1107 11/08/22 0500  Weight: 119.7 kg 119.7 kg 119.5 kg   Exam: General exam: Appears comfortable  HEENT: oral mucosa moist Respiratory system: Clear to auscultation.  Cardiovascular system: S1 & S2 heard  Gastrointestinal system:  Abdomen soft, non-tender, nondistended. Normal bowel sounds   Extremities: No cyanosis, clubbing - wound on lateral aspect of left foot noted- - he has swelling of the foot Psychiatry:  Mood & affect appropriate.      CBC: Recent Labs  Lab 11/05/22 1643 11/06/22 0514  WBC 12.5* 9.0  NEUTROABS 9.6*  --   HGB 12.6* 12.1*  HCT 39.8 38.6*  MCV 87.3 87.9  PLT 272 295    Basic Metabolic Panel: Recent Labs  Lab 11/05/22 1643 11/06/22 0514 11/06/22 0800 11/07/22 0430 11/08/22 0428  NA 135 136  --  139 136  K 3.1* 2.9*  --  3.8 3.7  CL 99 103  --  104 102  CO2 22 21*  --  23 24  GLUCOSE 214* 101*  --  143* 166*  BUN 30* 29*  --  32* 36*   CREATININE 2.44* 2.21*  --  2.14* 2.68*  CALCIUM 8.9 8.6*  --  9.4 8.8*  MG  --   --  2.1  --   --     GFR: Estimated Creatinine Clearance: 35.5 mL/min (A) (by C-G formula based on SCr of 2.68 mg/dL (H)).  Scheduled Meds:  ascorbic acid  500 mg Oral BID   aspirin EC  81 mg Oral Daily   atorvastatin  80 mg Oral Daily   dapagliflozin propanediol  10 mg Oral Daily   gabapentin  100 mg Oral QHS   heparin  5,000 Units Subcutaneous Q8H   insulin aspart  0-20 Units Subcutaneous TID WC   insulin aspart  0-5 Units Subcutaneous QHS   isosorbide mononitrate  180 mg Oral Daily   [START ON 11/09/2022] latanoprost  1 drop Both Eyes QHS   metoprolol succinate  75 mg Oral Daily   multivitamin with minerals  1 tablet Oral Daily   mupirocin ointment  1 Application Nasal BID   nutrition supplement (JUVEN)  1 packet Oral BID BM   pantoprazole  40 mg Oral BID   ticagrelor  90 mg Oral BID   Continuous Infusions: Imaging and lab data was personally reviewed DG Foot 2 Views Left  Result Date: 11/07/2022 CLINICAL DATA:  Open wound and osteomyelitis, postop. EXAM: LEFT FOOT - 2 VIEW COMPARISON:  11/05/2022 FINDINGS: Lucency at the base of the fifth metatarsal consistent with known osteomyelitis. There is a surgical drain in the soft tissues adjacent to the base of the fifth metatarsal. Osseous structures appear otherwise intact. IMPRESSION: Lucency consistent with known osteomyelitis involving the base of fifth metatarsal with adjacent soft tissue drain in place. Electronically Signed   By: Sammie Bench M.D.   On: 11/07/2022 13:13    LOS: 2 days   Author: Debbe Odea  11/08/2022 10:37 AM  To contact [facility]>   Check the care team in [facility] and look for the attending/consulting [place] provider listed  Log into [email or web address] and use [place]'s universal password   Go to> "[facility]"  and find provider  If you still have difficulty reaching the provider, please page the [facility]  (Director on Call) for the Hospitalists listed on amion

## 2022-11-08 NOTE — Progress Notes (Signed)
Patient originally on for preop virtual call today with [facility] but is currently admitted. Will need to cancel this and schedule formal post DC OV instead. See 09/28/22 clearance phone note for complete details; routed to callback team in that note.

## 2022-11-08 NOTE — Progress Notes (Signed)
    Subjective: 1 Day Post-Op Procedure(s) (LRB): IRRIGATION AND DEBRIDEMENT WOUND (Left) DOS: 11/07/2022  Patient resting comfortably. Wound VAC working appropriately. Pain controlled. No complaints  Objective: Vital signs in last 24 hours: Temp:  [97.5 F (36.4 C)-98.5 F (36.9 C)] 98.5 F (36.9 C) (11/27 1321) Pulse Rate:  [88-95] 95 (11/27 1321) Resp:  [15-18] 15 (11/27 1321) BP: (124-136)/(76-83) 132/83 (11/27 1321) SpO2:  [98 %-100 %] 100 % (11/27 1321) Weight:  [119.5 kg] 119.5 kg (11/27 0500)  Intake/Output from previous day: 11/26 0701 - 11/27 0700 In: 980 [P.O.:480; I.V.:500] Out: 1625 [Urine:1600; Drains:5; Blood:20] Intake/Output this shift: No intake/output data recorded.  Recent Labs    11/06/22 0514  HGB 12.1*   Recent Labs    11/06/22 0514  WBC 9.0  RBC 4.39  HCT 38.6*  PLT 245   Recent Labs    11/07/22 0430 11/08/22 0428  NA 139 136  K 3.8 3.7  CL 104 102  CO2 23 24  BUN 32* 36*  CREATININE 2.14* 2.68*  GLUCOSE 143* 166*  CALCIUM 9.4 8.8*   No results for input(s): "LABPT", "INR" in the last 72 hours.    Assessment/Plan: 1 Day Post-Op Procedure(s) (LRB): IRRIGATION AND DEBRIDEMENT WOUND (Left) DOS: 11/08/2022 -Wound VAC in place -Planning for DC to skilled nursing facility. Will need wound vac dressing changes there.  -Bone cultures pending.  ABX as per ID.  Always greatly appreciated -Podiatry will sign off. F/u outpatient one week post discharge   Edrick Kins 11/08/2022, 7:01 PM  Edrick Kins, DPM [facility]  Dr. Edrick Kins, DPM    [address], [place] [address]                Office [phone]  Fax [phone]

## 2022-11-08 NOTE — Plan of Care (Signed)
Id brief note  S/p culture/5th mt biopsy 11/26  -f/u culture -if susceptible, will send out on 4-6 weeks of appropriate oral abx

## 2022-11-08 NOTE — [place] FL2 (Signed)
[place] MEDICAID FL2 LEVEL OF CARE SCREENING TOOL     IDENTIFICATION  Patient Name: Joshua [name] Birthdate: 1956/08/08 Sex: male Admission Date (Current Location): 11/05/2022  [place] and [place] Number:  Kathleen Argue [id number] [place] and Address:  [facility] [place] [place] [address] [place], [place] [address]      Provider Number: [id number]  Attending Physician Name and Address:  Debbe Odea, MD  Relative Name and Phone Number:  Lealon Vanputten, son - [phone]    Current Level of Care: Hospital Recommended Level of Care: [place] Prior Approval Number:    Date Approved/Denied:   PASRR Number: [id number]  Discharge Plan: SNF    Current Diagnoses: Patient Active Problem List   Diagnosis Date Noted   Protein-calorie malnutrition, severe 11/08/2022   Osteomyelitis ([place]) 11/06/2022   Diabetic foot infection ([place]) 11/06/2022   Osteomyelitis of fifth toe of left foot ([place]) 11/05/2022   (HFpEF) heart failure with preserved ejection fraction ([place]) 11/05/2022   CKD (chronic kidney disease), stage IV ([place]) 11/05/2022   Dysphagia 03/16/2022   History of colonic polyps 03/16/2022   Spinal stenosis of lumbar region 11/03/2021   Mixed diabetic hyperlipidemia associated with type 2 diabetes mellitus ([place]) 08/11/2021   Chronic low back pain    Hypokalemia 09/25/2020   Insulin dependent type 2 diabetes mellitus ([place]) 09/24/2020   Unstable angina ([place]) 08/17/2019   Chest pain 08/16/2019   NSTEMI (non-ST elevated myocardial infarction) ([place]) 04/06/2019   ACS (acute coronary syndrome) ([place]) 04/06/2019   Sepsis ([place]) 03/15/2018   DKA (diabetic ketoacidoses) 03/15/2018   Special screening for malignant neoplasms, colon 08/22/2017   Acute renal failure superimposed on stage 3 chronic kidney disease ([place]) 05/19/2017   Hypertension associated with diabetes ([place])    Status post transmetatarsal amputation of foot, right ([place]) 11/08/2016    Diabetic polyneuropathy associated with type 2 diabetes mellitus ([place]) 11/08/2016   Pure hypercholesterolemia    AKI (acute kidney injury) ([place])    Acute blood loss anemia    DM type 2 with diabetic peripheral neuropathy ([place])    Physical debility 08/03/2016   Chronic osteomyelitis of right foot ([place])    Cellulitis and abscess of leg, except foot    Penetrating wound of right foot    CKD (chronic kidney disease) stage 3, GFR 30-59 ml/min (HCC)    Unstable angina pectoris ([place]) 07/18/2016   Chronic combined systolic and diastolic congestive heart failure (HCC)    Chest pain with moderate risk for cardiac etiology 06/24/2014   CAD S/P multiple PCIs 06/24/2014   Sleep apnea- on C-pap 06/24/2014   Class 3 severe obesity with serious comorbidity and body mass index (BMI) of 45.0 to 49.9 in adult ([place]) 11/10/2013   Hyperglycemia 10/29/2013   Hyperlipidemia 01/12/2011   Benign essential HTN 01/12/2011   GERD 01/12/2011    Orientation RESPIRATION BLADDER Height & Weight     Self, Time, Situation, Place  Normal Continent Weight: 119.5 kg Height:  5' 9.02" (175.3 cm)  BEHAVIORAL SYMPTOMS/MOOD NEUROLOGICAL BOWEL NUTRITION STATUS      Continent Diet  AMBULATORY STATUS COMMUNICATION OF NEEDS Skin   Independent Verbally Wound Vac, Surgical wounds (Left foot - wound vac post-surgery)                       Personal Care Assistance Level of Assistance  Bathing, Feeding, Dressing Bathing Assistance: Independent Feeding assistance: Independent Dressing Assistance: Independent     Functional Limitations Info  Sight, Hearing, Speech Sight Info: Adequate Hearing Info: Adequate Speech Info: Adequate    SPECIAL CARE FACTORS FREQUENCY                       Contractures Contractures Info: Not present    Additional Factors Info  Code Status, Allergies Code Status Info: Full code Allergies Info: Chlorthalidone           Current Medications (11/08/2022):  This is the  current hospital active medication list Current Facility-Administered Medications  Medication Dose Route Frequency Provider Last Rate Last Admin   acetaminophen (TYLENOL) tablet 650 mg  650 mg Oral Q6H PRN Lenore Cordia, MD       Or   acetaminophen (TYLENOL) suppository 650 mg  650 mg Rectal Q6H PRN Lenore Cordia, MD       ascorbic acid (VITAMIN C) tablet 500 mg  500 mg Oral BID Debbe Odea, MD   500 mg at 11/08/22 0846   aspirin EC tablet 81 mg  81 mg Oral Daily Lenore Cordia, MD   81 mg at 11/08/22 0846   atorvastatin (LIPITOR) tablet 80 mg  80 mg Oral Daily Zada Finders R, MD   80 mg at 11/08/22 0846   dapagliflozin propanediol (FARXIGA) tablet 10 mg  10 mg Oral Daily Zada Finders R, MD   10 mg at 11/08/22 0846   feeding supplement (GLUCERNA SHAKE) (GLUCERNA SHAKE) liquid 237 mL  237 mL Oral TID BM Rizwan, Eunice Blase, MD   237 mL at 11/08/22 1209   gabapentin (NEURONTIN) capsule 100 mg  100 mg Oral QHS Zada Finders R, MD   100 mg at 11/07/22 2135   heparin injection 5,000 Units  5,000 Units Subcutaneous Q8H Zada Finders R, MD   5,000 Units at 11/08/22 0544   insulin aspart (novoLOG) injection 0-20 Units  0-20 Units Subcutaneous TID WC Zada Finders R, MD   7 Units at 11/08/22 1208   insulin aspart (novoLOG) injection 0-5 Units  0-5 Units Subcutaneous QHS Zada Finders R, MD   2 Units at 11/07/22 2133   isosorbide mononitrate (IMDUR) 24 hr tablet 180 mg  180 mg Oral Daily Zada Finders R, MD   180 mg at 11/08/22 0846   [START ON 11/09/2022] latanoprost (XALATAN) 0.005 % ophthalmic solution 1 drop  1 drop Both Eyes QHS Rizwan, Saima, MD       metoprolol succinate (TOPROL-XL) 24 hr tablet 75 mg  75 mg Oral Daily Zada Finders R, MD   75 mg at 11/08/22 0845   multivitamin with minerals tablet 1 tablet  1 tablet Oral Daily Debbe Odea, MD   1 tablet at 11/08/22 0846   mupirocin ointment (BACTROBAN) 2 % 1 Application  1 Application Nasal BID Debbe Odea, MD   1 Application at 27/06/23 0846    nutrition supplement (JUVEN) (JUVEN) powder packet 1 packet  1 packet Oral BID BM Debbe Odea, MD   1 packet at 11/08/22 0846   ondansetron (ZOFRAN) tablet 4 mg  4 mg Oral Q6H PRN Lenore Cordia, MD       Or   ondansetron (ZOFRAN) injection 4 mg  4 mg Intravenous Q6H PRN Lenore Cordia, MD       oxyCODONE-acetaminophen (PERCOCET/ROXICET) 5-325 MG per tablet 1 tablet  1 tablet Oral Q6H PRN Lenore Cordia, MD   1 tablet at 11/08/22 1212   pantoprazole (PROTONIX) EC tablet 40 mg  40 mg Oral BID Lenore Cordia,  MD   40 mg at 11/08/22 0846   senna-docusate (Senokot-S) tablet 1 tablet  1 tablet Oral QHS PRN Lenore Cordia, MD   1 tablet at 11/07/22 2149   ticagrelor (BRILINTA) tablet 90 mg  90 mg Oral BID Lenore Cordia, MD   90 mg at 11/08/22 0845     Discharge Medications: Please see discharge summary for a list of discharge medications.  Relevant Imaging Results:  Relevant Lab Results:   Additional Information SSN: [id number], Wound Vac needs  Curlene Labrum, RN

## 2022-11-08 NOTE — Telephone Encounter (Signed)
I will fax notes to requesting office to se notes from Pre op provider Melina Copa, PAC. Pt will need to be scheduled for in office appt once he has been d/c from the hospital.

## 2022-11-09 ENCOUNTER — Encounter (HOSPITAL_COMMUNITY): Payer: Medicare Other

## 2022-11-09 DIAGNOSIS — M869 Osteomyelitis, unspecified: Secondary | ICD-10-CM | POA: Diagnosis not present

## 2022-11-09 LAB — GLUCOSE, CAPILLARY
Glucose-Capillary: 178 mg/dL — ABNORMAL HIGH (ref 70–99)
Glucose-Capillary: 231 mg/dL — ABNORMAL HIGH (ref 70–99)
Glucose-Capillary: 253 mg/dL — ABNORMAL HIGH (ref 70–99)

## 2022-11-09 LAB — SURGICAL PATHOLOGY

## 2022-11-09 MED ORDER — DOXYCYCLINE HYCLATE 100 MG PO TABS
100.0000 mg | ORAL_TABLET | Freq: Two times a day (BID) | ORAL | Status: DC
  Administered 2022-11-09 – 2022-11-16 (×15): 100 mg via ORAL
  Filled 2022-11-09 (×15): qty 1

## 2022-11-09 MED ORDER — CEFAZOLIN SODIUM-DEXTROSE 1-4 GM/50ML-% IV SOLN: 1.0000 g | Freq: Three times a day (TID) | INTRAVENOUS | Status: DC

## 2022-11-09 MED ORDER — AMOXICILLIN-POT CLAVULANATE 875-125 MG PO TABS
1.0000 | ORAL_TABLET | Freq: Two times a day (BID) | ORAL | Status: DC
  Administered 2022-11-09 – 2022-11-16 (×15): 1 via ORAL
  Filled 2022-11-09 (×15): qty 1

## 2022-11-09 MED ORDER — LINAGLIPTIN 5 MG PO TABS
5.0000 mg | ORAL_TABLET | Freq: Every day | ORAL | Status: DC
  Administered 2022-11-09 – 2022-11-16 (×8): 5 mg via ORAL
  Filled 2022-11-09 (×8): qty 1

## 2022-11-09 NOTE — Progress Notes (Signed)
Patient placed on CPAP at this time.  

## 2022-11-09 NOTE — Care Management Important Message (Signed)
Important Message  Patient Details  Name: EMRIK ERHARD MRN: [id number] Date of Birth: [birth year]   Medicare Important Message Given:  Yes     Luqman Perrelli [address] 11/09/2022, 3:11 PM

## 2022-11-09 NOTE — Progress Notes (Signed)
Diagnosis: 66 yo male dm2 here for chronic left foot ulcer and imaging/clinical concern of 5th mt om. He has been on doxycycline prior to admission  Culture Result: 11/26 bone/tissue cx proteus mirabilis  Will continue doxy and amox/clav for 4 weeks  Ok to discharge when ready per primary team   Clinic Follow Up Appt: 12/7 @ 345 with dr Gale Journey  @  [facility] [place], [place], [place] [address] Phone: [phone]   --------------- Subjective Doing well Wound vac No fever chill No n/v/diarrhea/rash    Objective: Vitals:   11/09/22 0455 11/09/22 0751  BP: (!) 142/79 (!) 155/85  Pulse: 84 87  Resp: 16 18  Temp: 97.8 F (36.6 C) 98.5 F (36.9 C)  SpO2: 97% 98%   General/constitutional: no distress, pleasant HEENT: Normocephalic, PER, Conj Clear, EOMI, Oropharynx clear Neck supple CV: rrr no mrg Lungs: clear to auscultation, normal respiratory effort Abd: Soft, Nontender Ext: no edema Skin: No Rash Neuro: nonfocal MSK: right tma; left foot with wound vac   Labs: Lab Results  Component Value Date   WBC 9.0 11/06/2022   HGB 12.1 (L) 11/06/2022   HCT 38.6 (L) 11/06/2022   MCV 87.9 11/06/2022   PLT 245 45/80/9983   Last metabolic panel Lab Results  Component Value Date   GLUCOSE 166 (H) 11/08/2022   NA 136 11/08/2022   K 3.7 11/08/2022   CL 102 11/08/2022   CO2 24 11/08/2022   BUN 36 (H) 11/08/2022   CREATININE 2.68 (H) 11/08/2022   GFRNONAA 26 (L) 11/08/2022   CALCIUM 8.8 (L) 11/08/2022   PHOS 2.2 (L) 03/20/2018   PROT 7.2 08/11/2021   ALBUMIN 3.9 08/11/2021   LABGLOB 3.3 08/11/2021   AGRATIO 1.2 08/11/2021   BILITOT <0.2 08/11/2021   ALKPHOS 183 (H) 08/11/2021   AST 16 08/11/2021   ALT 12 08/11/2021   ANIONGAP 10 11/08/2022

## 2022-11-09 NOTE — TOC Progression Note (Addendum)
Transition of Care [facility]) - Progression Note    Patient Details  Name: DOC MANDALA MRN: [id number] Date of Birth: 1956/09/11  Transition [facility]) CM/SW [place], RN Phone Number: 11/09/2022, 10:06 AM  Clinical Narrative:    CM met with the patient at the bedside to offer Medicare choice regarding SNF placement and the patient chose [facility] SN[facility].  I called Juliann Pulse, CM at [facility] and confirmed bed offer.  Insurance authorization will be started and the facility was updated regarding patient's need for wound vac,  I spoke with the patient and the patient will likely discharge to his son's apartment/ new apartment once discharged from the facility after short term rehabilitation.  CM will continue to follow the patient for SNF placement.  11/09/2022 1059- CM called and spoke with Juliann Pulse, CM at [facility] and she is ordering a wound vac machine and CPAP machine- pt to bring mask from hospital room to facility - insur Josem Kaufmann is still pending at this time.   Expected Discharge Plan: [place] Barriers to Discharge: Continued Medical Work up, Homeless with medical needs  Expected Discharge Plan and Services Expected Discharge Plan: [place]   Discharge Planning Services: CM Consult   Living arrangements for the past 2 months: Homeless (currently staying with a roommate in community housing room with no running water)                                       Social Determinants of Health (SDOH) Interventions    Readmission Risk Interventions    11/08/2022    2:21 PM  Readmission Risk Prevention Plan  Transportation Screening Complete  PCP or Specialist Appt within 5-7 Days Complete  Home Care Screening Complete  Medication Review (RN CM) Complete

## 2022-11-09 NOTE — Evaluation (Signed)
Occupational Therapy Evaluation and Discharge Patient Details Name: Joshua [name] MRN: [id number] DOB: June 19, 1956 Today's Date: 11/09/2022   History of Present Illness Pt is a 66 y.o. M who presents for evaluation of left foot wound now s/p debridement 11/07/2022. Significant PMH: HF, CAD s/p PCI, CKD stage IV, DM2, COPD, HTN, obesity, prior right TMA.   Clinical Impression   Pt admitted with the above diagnoses and presents with below problem list. Pt will benefit from continued acute OT to address the below listed deficits and maximize independence with basic ADLs prior to d/c to venue below. PTA pt was using a cane for transfers/mobility, mod I to independent with ADLs. Pt presents at/near baseline with ADLs, able to walk in the halls with his cane with no assist other than to hold wound vac. Pt has no further OT needs at this time but do agree with recommendation for SNF at d/c for managing wound vac.       Recommendations for follow up therapy are one component of a multi-disciplinary discharge planning process, led by the attending physician.  Recommendations may be updated based on patient status, additional functional criteria and insurance authorization.   Follow Up Recommendations  No OT follow up ; SNF for wound care    Assistance Recommended at Discharge PRN  Patient can return home with the following Assist for transportation;Other (comment) (assist with wound care)    Functional Status Assessment  Patient has not had a recent decline in their functional status  Equipment Recommendations  None recommended by OT    Recommendations for Other Services       Precautions / Restrictions Precautions Precautions: Fall Required Braces or Orthoses: Other Brace Other Brace: R post op shoe Restrictions Weight Bearing Restrictions: Yes LLE Weight Bearing: Weight bearing as tolerated      Mobility Bed Mobility               General bed mobility comments: OOB in  chair    Transfers Overall transfer level: Modified independent Equipment used: Straight cane                      Balance Overall balance assessment: Modified Independent                                         ADL either performed or assessed with clinical judgement   ADL Overall ADL's : Modified independent                                       General ADL Comments: Pt walked in the hall using his cane from home, mod I level.     Vision         Perception     Praxis      Pertinent Vitals/Pain Pain Assessment Pain Assessment: Faces Faces Pain Scale: Hurts little more Pain Location: L foot Pain Descriptors / Indicators: Discomfort, Constant Pain Intervention(s): Monitored during session, Limited activity within patient's tolerance, Repositioned     Hand Dominance Right   Extremity/Trunk Assessment Upper Extremity Assessment Upper Extremity Assessment: Overall WFL for tasks assessed   Lower Extremity Assessment Lower Extremity Assessment: Defer to PT evaluation   Cervical / Trunk Assessment Cervical / Trunk Assessment: Normal   Communication Communication Communication: No difficulties  Cognition Arousal/Alertness: Awake/alert Behavior During Therapy: WFL for tasks assessed/performed Overall Cognitive Status: Within Functional Limits for tasks assessed                                       General Comments       Exercises     Shoulder Instructions      Home Living Family/patient expects to be discharged to:: Shelter/Homeless                                 Additional Comments: Pt lives in community housing project with lighting and heat; community bathroom 30 yards away, no running water or outlets in his shelter. Pt does have a car. Has been showering at son's house.      Prior Functioning/Environment Prior Level of Function : Independent/Modified Independent                ADLs Comments: endorses struggling with getting in/out of bed d/t elevated bed height.        OT Problem List:        OT Treatment/Interventions:      OT Goals(Current goals can be found in the care plan section)    OT Frequency:      Co-evaluation              AM-PAC OT "6 Clicks" Daily Activity     Outcome Measure Help from another person eating meals?: None Help from another person taking care of personal grooming?: None Help from another person toileting, which includes using toliet, bedpan, or urinal?: None Help from another person bathing (including washing, rinsing, drying)?: A Little Help from another person to put on and taking off regular upper body clothing?: None Help from another person to put on and taking off regular lower body clothing?: None 6 Click Score: 23   End of Session Equipment Utilized During Treatment: Other (comment) (cane)  Activity Tolerance: Patient tolerated treatment well Patient left: in chair;with call bell/phone within reach  OT Visit Diagnosis: Unsteadiness on feet (R26.81)                Time: 3810-1751 OT Time Calculation (min): 13 min Charges:  OT General Charges $OT Visit: 1 Visit OT Evaluation $OT Eval Low Complexity: [place], OT Acute Rehabilitation Services Office: [phone]   Hortencia Pilar 11/09/2022, 10:54 AM

## 2022-11-09 NOTE — Progress Notes (Signed)
Pt states he will self administer CPAP when he is ready for rest. 

## 2022-11-09 NOTE — Inpatient Diabetes Management (Signed)
Inpatient Diabetes Program Recommendations  AACE/ADA: New Consensus Statement on Inpatient Glycemic Control (2015)  Target Ranges:  Prepandial:   less than 140 mg/dL      Peak postprandial:   less than 180 mg/dL (1-2 hours)      Critically ill patients:  140 - 180 mg/dL   Lab Results  Component Value Date   GLUCAP 178 (H) 11/09/2022   HGBA1C 7.6 (A) 08/26/2022    Review of Glycemic Control  Latest Reference Range & Units 11/08/22 07:33 11/08/22 11:50 11/08/22 15:25 11/08/22 18:41 11/08/22 20:58 11/09/22 07:50  Glucose-Capillary 70 - 99 mg/dL 165 (H) 207 (H) 209 (H) 215 (H) 267 (H) 178 (H)  (H): Data is abnormally high  Diabetes history: DM2 Outpatient Diabetes medications:  Lantus 30 units BID Humalog 10-18 units TID Ozempic 1 mg every Monday Current orders for Inpatient glycemic control:  Novolog 0-20 units TID and 0-5 units QHS, Farxiga 10 units QD  Inpatient Diabetes Program Recommendations:    Postprandial glucose elevated.  Please consider,  Novolog 3 units TID with meals if consumes at least 50%.  Will likely need some basal insulin added to regimen as well.    Will continue to follow while inpatient.  Thank you, Reche Dixon, MSN, [place] Diabetes Coordinator Inpatient Diabetes Program [phone] (team pager from 8a-5p)

## 2022-11-09 NOTE — Progress Notes (Signed)
Mobility Specialist: Progress Note   11/09/22 1151  Mobility  Activity Ambulated with assistance in hallway  Level of Assistance Contact guard assist, steadying assist  Assistive Device Cane  Distance Ambulated (ft) 350 ft  LLE Weight Bearing WBAT  Activity Response Tolerated well  Mobility Referral Yes  $Mobility charge 1 Mobility   Pt received in the chair and agreeable to mobility. C/o soreness in L foot, otherwise asymptomatic. Lateral sway but no over LOB. Pt back to the chair per request after session with call bell and phone in reach.   [place] Ronie Fleeger Mobility Specialist Please contact via SecureChat or Rehab office at [phone]

## 2022-11-09 NOTE — Progress Notes (Addendum)
[facility] Progress Note  Patient: Joshua [name]     ZHG:[id number]  DOA: 11/05/2022   PCP: [name], MD       Brief hospital course: This is a 66 year old male with heart failure with preserved EF, CAD status post PCI and stenting, CKD stage IV, diabetes mellitus type 2, COPD, hypertension, obesity, prior right transmetatarsal amputation who presents to the hospital for an evaluation of a left foot wound. The patient states that the wound began as a callus about 2 months ago.  He was evaluated by podiatry and the callus was shaved off.  He subsequently had an infection in that area and was placed on oral antibiotics.  The wound has since not closed up and this past week the pain increased in his foot.  He therefore presented to the ED for an evaluation.  Subjective:  He has no new complaints.   Assessment and Plan: Principal Problem: Wound on left lateral aspect of the left foot Osteomyelitis of fifth toe of left foot   Right TMA - he has undergone an bone biopsy and had a wound vac - bone biopsy/wound culture is growing Proteus Mirabilis- start Augmentin- will need 4-6 wks (per last ID note) and f/u with podiatry in 1-2 wks - ABI on 11/20 were normal - His social situation may make it difficult for this wound to heal-he is homeless and has been living in a room that he shares with a roommate.  He has no access to nutritious food and has been going to his son's home on a daily basis to shower and keep the wound clean  - we have had an extensive discussion regarding finding healthy nutrition which will ultimately allow better wound healing - cont Juven and dietary supplements - TOC working on SNF for wound care- if he is unable to go to SNF, he may need to stay with son  Active Problems:   CAD S/P multiple PCIs   (HFpEF) heart failure with preserved ejection fraction ([place]) -Last echo in 9/22 revealed grade 2 diastolic dysfunction, moderate dilatation of the left  atrium and an EF of 55 to 60% - Continue Brilinta, furosemide, aspirin, Lipitor    CKD (chronic kidney disease), stage IV (HCC) -GFR ranges between 29-32  Hypokalemia - Secondary to furosemide - Continue to replace as needed    Insulin dependent type 2 diabetes mellitus ([place]) Diabetic neuropathy -The patient states that due to his poor oral intake over the past couple of months, he has lost weight and has reduced his insulin doses - weight loss also likely secondary to [place] which he takes once a week - has decreased sensation in feet related to diabetic neuropathy - A1c is 7.6 - Continue NovoLog sliding scale and [place]ade - he has not requiring resumption of Lantus  - CBGs 100-200 range - start Tradjenta 5 mg daily    Morbid obesity Body mass index is 40.9 kg/m.       Code Status: Full Code Consultants: Podiatry, ID Level of Care: Level of care: Med-Surg Total time on patient care: 30 minutes DVT prophylaxis: heparin  Objective:   Vitals:   11/09/22 0019 11/09/22 0455 11/09/22 0500 11/09/22 0751  BP:  (!) 142/79  (!) 155/85  Pulse:  84  87  Resp: $Remo'16 16  18  'KHnWo$ Temp:  97.8 F (36.6 C)  98.5 F (36.9 C)  TempSrc:  Oral  Oral  SpO2: 99% 97%  98%  Weight:   125.7 kg  Height:       Filed Weights   11/07/22 1107 11/08/22 0500 11/09/22 0500  Weight: 119.7 kg 119.5 kg 125.7 kg   Exam: General exam: Appears comfortable  HEENT: oral mucosa moist Respiratory system: Clear to auscultation.  Cardiovascular system: S1 & S2 heard  Gastrointestinal system: Abdomen soft, non-tender, nondistended. Normal bowel sounds   Extremities: No cyanosis, clubbing - wound vac present on left lateral foot- right TMA Psychiatry:  Mood & affect appropriate.       CBC: Recent Labs  Lab 11/05/22 1643 11/06/22 0514  WBC 12.5* 9.0  NEUTROABS 9.6*  --   HGB 12.6* 12.1*  HCT 39.8 38.6*  MCV 87.3 87.9  PLT 272 323    Basic Metabolic Panel: Recent Labs  Lab 11/05/22 1643  11/06/22 0514 11/06/22 0800 11/07/22 0430 11/08/22 0428  NA 135 136  --  139 136  K 3.1* 2.9*  --  3.8 3.7  CL 99 103  --  104 102  CO2 22 21*  --  23 24  GLUCOSE 214* 101*  --  143* 166*  BUN 30* 29*  --  32* 36*  CREATININE 2.44* 2.21*  --  2.14* 2.68*  CALCIUM 8.9 8.6*  --  9.4 8.8*  MG  --   --  2.1  --   --     GFR: Estimated Creatinine Clearance: 36.5 mL/min (A) (by C-G formula based on SCr of 2.68 mg/dL (H)).  Scheduled Meds:  ascorbic acid  500 mg Oral BID   aspirin EC  81 mg Oral Daily   atorvastatin  80 mg Oral Daily   dapagliflozin propanediol  10 mg Oral Daily   feeding supplement (GLUCERNA SHAKE)  237 mL Oral TID BM   gabapentin  100 mg Oral QHS   heparin  5,000 Units Subcutaneous Q8H   insulin aspart  0-20 Units Subcutaneous TID WC   insulin aspart  0-5 Units Subcutaneous QHS   isosorbide mononitrate  180 mg Oral Daily   latanoprost  1 drop Both Eyes QHS   metoprolol succinate  75 mg Oral Daily   multivitamin with minerals  1 tablet Oral Daily   mupirocin ointment  1 Application Nasal BID   nutrition supplement (JUVEN)  1 packet Oral BID BM   pantoprazole  40 mg Oral BID   ticagrelor  90 mg Oral BID   Continuous Infusions: Imaging and lab data was personally reviewed DG Foot Complete Left  Result Date: 11/08/2022 Please see detailed radiograph report in office note.  DG Foot 2 Views Left  Result Date: 11/07/2022 CLINICAL DATA:  Open wound and osteomyelitis, postop. EXAM: LEFT FOOT - 2 VIEW COMPARISON:  11/05/2022 FINDINGS: Lucency at the base of the fifth metatarsal consistent with known osteomyelitis. There is a surgical drain in the soft tissues adjacent to the base of the fifth metatarsal. Osseous structures appear otherwise intact. IMPRESSION: Lucency consistent with known osteomyelitis involving the base of fifth metatarsal with adjacent soft tissue drain in place. Electronically Signed   By: Sammie Bench M.D.   On: 11/07/2022 13:13    LOS: 3  days   Author: Debbe Odea  11/09/2022 10:46 AM  To contact [facility]>   Check the care team in [facility] and look for the attending/consulting [place] provider listed  Log into [email or web address] and use [place]'s universal password   Go to> "[facility]"  and find provider  If you still have difficulty reaching the provider, please page the [facility] (Director  on Call) for the Hospitalists listed on amion

## 2022-11-10 ENCOUNTER — Encounter: Payer: Self-pay | Admitting: *Deleted

## 2022-11-10 DIAGNOSIS — I5032 Chronic diastolic (congestive) heart failure: Secondary | ICD-10-CM | POA: Diagnosis not present

## 2022-11-10 DIAGNOSIS — E119 Type 2 diabetes mellitus without complications: Secondary | ICD-10-CM | POA: Diagnosis not present

## 2022-11-10 DIAGNOSIS — N184 Chronic kidney disease, stage 4 (severe): Secondary | ICD-10-CM | POA: Diagnosis not present

## 2022-11-10 DIAGNOSIS — M869 Osteomyelitis, unspecified: Secondary | ICD-10-CM | POA: Diagnosis not present

## 2022-11-10 LAB — BASIC METABOLIC PANEL
Anion gap: 9 (ref 5–15)
BUN: 50 mg/dL — ABNORMAL HIGH (ref 8–23)
CO2: 23 mmol/L (ref 22–32)
Calcium: 8.7 mg/dL — ABNORMAL LOW (ref 8.9–10.3)
Chloride: 103 mmol/L (ref 98–111)
Creatinine, Ser: 2.01 mg/dL — ABNORMAL HIGH (ref 0.61–1.24)
GFR, Estimated: 36 mL/min — ABNORMAL LOW (ref >60)
Glucose, Bld: 177 mg/dL — ABNORMAL HIGH (ref 70–99)
Potassium: 3.8 mmol/L (ref 3.5–5.1)
Sodium: 135 mmol/L (ref 135–145)

## 2022-11-10 LAB — GLUCOSE, CAPILLARY
Glucose-Capillary: 272 mg/dL — ABNORMAL HIGH (ref 70–99)
Glucose-Capillary: 162 mg/dL — ABNORMAL HIGH (ref 70–99)
Glucose-Capillary: 217 mg/dL — ABNORMAL HIGH (ref 70–99)
Glucose-Capillary: 171 mg/dL — ABNORMAL HIGH (ref 70–99)
Glucose-Capillary: 271 mg/dL — ABNORMAL HIGH (ref 70–99)

## 2022-11-10 MED ORDER — INSULIN ASPART 100 UNIT/ML IJ SOLN
3.0000 [IU] | Freq: Three times a day (TID) | INTRAMUSCULAR | Status: DC
  Administered 2022-11-10 – 2022-11-16 (×18): 3 [IU] via SUBCUTANEOUS

## 2022-11-10 NOTE — Progress Notes (Signed)
Mobility Specialist Progress Note:   11/10/22 1038  Mobility  Activity Ambulated independently in hallway  Level of Assistance Modified independent, requires aide device or extra time  Assistive Device Cane  Distance Ambulated (ft) 350 ft  LLE Weight Bearing WBAT  Activity Response Tolerated well  Mobility Referral Yes  $Mobility charge 1 Mobility   Pt received in chair and agreeable. No complaints. Pt left in chair with all needs met and call bell in reach.   Andrey Campanile Mobility Specialist Please contact via SecureChat or  Rehab office at [phone]

## 2022-11-10 NOTE — Progress Notes (Signed)
PROGRESS NOTE    Joshua [name]  WJX:[id number] DOB: 01-10-1956 DOA: 11/05/2022 PCP: [name], MD   Brief Narrative:  66 year old male with heart failure with preserved EF, CAD status post PCI and stenting, CKD stage IV, diabetes mellitus type 2, COPD, hypertension, obesity, prior right transmetatarsal amputation presented with worsening left foot wound despite outpatient oral antibiotics.  He was found to have left foot osteomyelitis.  He was started on broad-spectrum antibiotics.  Podiatry and ID were consulted.  He underwent bone biopsy and wound VAC placement.  Wound culture grew Proteus mirabilis: Antibiotics have been switched to Augmentin by ID.  PT recommended SNF placement.  Assessment & Plan:   Left foot fifth toe osteomyelitis Chronic wound on left lateral aspect of the left foot History of right TMA -He was started on broad-spectrum antibiotics.  Podiatry and ID were consulted.  He underwent bone biopsy and wound VAC placement.  Wound culture grew Proteus mirabilis: Antibiotics have been switched to Augmentin by ID.  Currently also on doxycycline.  Will need doxycycline and Augmentin for 4 weeks as per ID recommendations with outpatient follow-up with ID on 11/18/2022.  ID has cleared the patient for discharge. -Podiatry signed off on 11/08/2022.  Outpatient follow-up with podiatry 1 week after discharge -ABI on 11/01/2022 were normal - PT recommended SNF placement.  Social worker following  CAD status post multiple PCI's Chronic diastolic heart failure Hyperlipidemia -Last echo in 08/2021  revealed grade 2 diastolic dysfunction, moderate dilatation of the left atrium and an EF of 55 to 60% -Continue Brilinta, aspirin, Lipitor, Imdur and Toprol-XL.  Strict input and output.  Daily weights.  Fluid restriction.  Outpatient follow-up with cardiology  CKD stage IV -Creatinine at baseline.  Monitor.  Diabetes mellitus type 2 with hyperglycemia Diabetic neuropathy -Continue  CBGs with SSI.  Continue Farxiga and gabapentin -A1c 7.6.  Continue linagliptin  Morbid obesity -Outpatient follow-up  Leukocytosis -Resolved  Anemia of chronic disease -From chronic kidney disease.  Hemoglobin stable.  Monitor intermittently   DVT prophylaxis: Heparin subcutaneous Code Status: Full Family Communication: None at bedside Disposition Plan: Status is: Inpatient Remains inpatient appropriate because: Of severity of illness.  Need for SNF placement.  Consultants: Podiatry/ID  Procedures: As above  Antimicrobials:  Anti-infectives (From admission, onward)    Start     Dose/Rate Route Frequency Ordered Stop   11/09/22 1400  ceFAZolin (ANCEF) IVPB 1 g/50 mL premix  Status:  Discontinued        1 g 100 mL/hr over 30 Minutes Intravenous Every 8 hours 11/09/22 1050 11/09/22 1056   11/09/22 1145  doxycycline (VIBRA-TABS) tablet 100 mg        100 mg Oral Every 12 hours 11/09/22 1056     11/09/22 1145  amoxicillin-clavulanate (AUGMENTIN) 875-125 MG per tablet 1 tablet        1 tablet Oral Every 12 hours 11/09/22 1056     11/06/22 2300  vancomycin (VANCOCIN) IVPB 1000 mg/200 mL premix  Status:  Discontinued        1,000 mg 200 mL/hr over 60 Minutes Intravenous Every 24 hours 11/05/22 2157 11/05/22 2352   11/06/22 0600  piperacillin-tazobactam (ZOSYN) IVPB 3.375 g  Status:  Discontinued        3.375 g 12.5 mL/hr over 240 Minutes Intravenous Every 8 hours 11/05/22 2153 11/05/22 2352   11/05/22 2200  piperacillin-tazobactam (ZOSYN) IVPB 3.375 g        3.375 g 100 mL/hr over 30 Minutes Intravenous  Once  11/05/22 2146 11/05/22 2312   11/05/22 2200  vancomycin (VANCOCIN) 2,500 mg in sodium chloride 0.9 % 500 mL IVPB  Status:  Discontinued        2,500 mg 262.5 mL/hr over 120 Minutes Intravenous  Once 11/05/22 2148 11/05/22 2352        Subjective: Patient seen and examined at bedside.  Denies worsening nausea, vomiting or shortness of breath.  Objective: Vitals:    11/09/22 0751 11/09/22 1556 11/10/22 0420 11/10/22 1026  BP: (!) 155/85 132/78 135/72 (!) 141/78  Pulse: 87 92 84 94  Resp: $Remo'18 18 17 18  'AlpvD$ Temp: 98.5 F (36.9 C) 98.6 F (37 C) 98 F (36.7 C) 98.7 F (37.1 C)  TempSrc: Oral Oral Oral Oral  SpO2: 98% 100% 100% 100%  Weight:   125.6 kg   Height:        Intake/Output Summary (Last 24 hours) at 11/10/2022 1030 Last data filed at 11/10/2022 0600 Gross per 24 hour  Intake 780 ml  Output 1000 ml  Net -220 ml   Filed Weights   11/08/22 0500 11/09/22 0500 11/10/22 0420  Weight: 119.5 kg 125.7 kg 125.6 kg    Examination:  General exam: Appears calm and comfortable. On room air. Respiratory system: Bilateral decreased breath sounds at bases with scattered crackles Cardiovascular system: S1 & S2 heard, Rate controlled Gastrointestinal system: Abdomen is morbidly obese, nondistended, soft and nontender. Normal bowel sounds heard. Extremities: No cyanosis, clubbing; bilateral lower extremity edema present.  Wound VAC present on left foot.  Right TMA present.   Data Reviewed: I have personally reviewed following labs and imaging studies  CBC: Recent Labs  Lab 11/05/22 1643 11/06/22 0514  WBC 12.5* 9.0  NEUTROABS 9.6*  --   HGB 12.6* 12.1*  HCT 39.8 38.6*  MCV 87.3 87.9  PLT 272 413   Basic Metabolic Panel: Recent Labs  Lab 11/05/22 1643 11/06/22 0514 11/06/22 0800 11/07/22 0430 11/08/22 0428 11/10/22 0607  NA 135 136  --  139 136 135  K 3.1* 2.9*  --  3.8 3.7 3.8  CL 99 103  --  104 102 103  CO2 22 21*  --  $R'23 24 23  'zL$ GLUCOSE 214* 101*  --  143* 166* 177*  BUN 30* 29*  --  32* 36* 50*  CREATININE 2.44* 2.21*  --  2.14* 2.68* 2.01*  CALCIUM 8.9 8.6*  --  9.4 8.8* 8.7*  MG  --   --  2.1  --   --   --    GFR: Estimated Creatinine Clearance: 48.7 mL/min (A) (by C-G formula based on SCr of 2.01 mg/dL (H)). Liver Function Tests: No results for input(s): "AST", "ALT", "ALKPHOS", "BILITOT", "PROT", "ALBUMIN" in the last  168 hours. No results for input(s): "LIPASE", "AMYLASE" in the last 168 hours. No results for input(s): "AMMONIA" in the last 168 hours. Coagulation Profile: No results for input(s): "INR", "PROTIME" in the last 168 hours. Cardiac Enzymes: No results for input(s): "CKTOTAL", "CKMB", "CKMBINDEX", "TROPONINI" in the last 168 hours. BNP (last 3 results) No results for input(s): "PROBNP" in the last 8760 hours. HbA1C: No results for input(s): "HGBA1C" in the last 72 hours. CBG: Recent Labs  Lab 11/09/22 0750 11/09/22 1207 11/09/22 1555 11/09/22 2214 11/10/22 0711  GLUCAP 178* 231* 253* 272* 162*   Lipid Profile: No results for input(s): "CHOL", "HDL", "LDLCALC", "TRIG", "CHOLHDL", "LDLDIRECT" in the last 72 hours. Thyroid Function Tests: No results for input(s): "TSH", "T4TOTAL", "FREET4", "T3FREE", "THYROIDAB" in  the last 72 hours. Anemia Panel: No results for input(s): "VITAMINB12", "FOLATE", "FERRITIN", "TIBC", "IRON", "RETICCTPCT" in the last 72 hours. Sepsis Labs: No results for input(s): "PROCALCITON", "LATICACIDVEN" in the last 168 hours.  Recent Results (from the past 240 hour(s))  Surgical PCR screen     Status: None   Collection Time: 11/06/22  5:42 PM   Specimen: Nasal Mucosa; Nasal Swab  Result Value Ref Range Status   MRSA, PCR NEGATIVE NEGATIVE Final   Staphylococcus aureus NEGATIVE NEGATIVE Final    Comment: (NOTE) The Xpert SA Assay (FDA approved for NASAL specimens in patients 63 years of age and older), is one component of a comprehensive surveillance program. It is not intended to diagnose infection nor to guide or monitor treatment. Performed at [facility], [place] [address] [place], [place] [address]   Aerobic/Anaerobic Culture w Gram Stain (surgical/deep wound)     Status: None (Preliminary result)   Collection Time: 11/07/22 12:23 PM   Specimen: Wound  Result Value Ref Range Status   Specimen Description BONE  Final   Special Requests LEFT  FOOT  Final   Gram Stain   Final    NO ORGANISMS SEEN NO WBC SEEN Performed at [facility], [place] [address], [place], [place] [address]    Culture   Final    RARE PROTEUS MIRABILIS NO ANAEROBES ISOLATED; CULTURE IN PROGRESS FOR 5 DAYS    Report Status PENDING  Incomplete   Organism ID, Bacteria PROTEUS MIRABILIS  Final      Susceptibility   Proteus mirabilis - MIC*    AMPICILLIN >=32 RESISTANT Resistant     CEFAZOLIN 8 SENSITIVE Sensitive     CEFEPIME <=0.12 SENSITIVE Sensitive     CEFTAZIDIME <=1 SENSITIVE Sensitive     CEFTRIAXONE <=0.25 SENSITIVE Sensitive     CIPROFLOXACIN <=0.25 SENSITIVE Sensitive     GENTAMICIN <=1 SENSITIVE Sensitive     IMIPENEM 2 SENSITIVE Sensitive     TRIMETH/SULFA >=320 RESISTANT Resistant     AMPICILLIN/SULBACTAM 8 SENSITIVE Sensitive     PIP/TAZO <=4 SENSITIVE Sensitive     * RARE PROTEUS MIRABILIS  Aerobic/Anaerobic Culture w Gram Stain (surgical/deep wound)     Status: None (Preliminary result)   Collection Time: 11/07/22 12:24 PM   Specimen: PATH Bone resection; Tissue  Result Value Ref Range Status   Specimen Description WOUND  Final   Special Requests LEFT FOOT DEEP  Final   Gram Stain   Final    NO ORGANISMS SEEN NO WBC SEEN Performed at [facility], [address], [place], [place] [address]    Culture   Final    FEW PROTEUS MIRABILIS SUSCEPTIBILITIES PERFORMED ON PREVIOUS CULTURE WITHIN THE LAST 5 DAYS. NO ANAEROBES ISOLATED; CULTURE IN PROGRESS FOR 5 DAYS    Report Status PENDING  Incomplete         Radiology Studies: DG Foot Complete Left  Result Date: 11/08/2022 Please see detailed radiograph report in office note.       Scheduled Meds:  amoxicillin-clavulanate  1 tablet Oral Q12H   ascorbic acid  500 mg Oral BID   aspirin EC  81 mg Oral Daily   atorvastatin  80 mg Oral Daily   dapagliflozin propanediol  10 mg Oral Daily   doxycycline  100 mg Oral Q12H   feeding supplement (GLUCERNA SHAKE)  237  mL Oral TID BM   gabapentin  100 mg Oral QHS   heparin  5,000 Units  Subcutaneous Q8H   insulin aspart  0-20 Units Subcutaneous TID WC   insulin aspart  0-5 Units Subcutaneous QHS   isosorbide mononitrate  180 mg Oral Daily   latanoprost  1 drop Both Eyes QHS   linagliptin  5 mg Oral Daily   metoprolol succinate  75 mg Oral Daily   multivitamin with minerals  1 tablet Oral Daily   mupirocin ointment  1 Application Nasal BID   nutrition supplement (JUVEN)  1 packet Oral BID BM   pantoprazole  40 mg Oral BID   ticagrelor  90 mg Oral BID   Continuous Infusions:        Aline August, MD [facility] 11/10/2022, 10:30 AM

## 2022-11-10 NOTE — Consult Note (Signed)
[place] Nurse wound follow up Wound type: Left lateral midfoot wound  S/P surgical debridement Measurement: 6 cm x 6 cm with skin substitute to woundbed.  Wound bed:not visualized  skin substitute Drainage (amount, consistency, odor) minimal blood tinged effluent in canister Periwound: Dry skin, some epithelial peeling Dressing procedure/placement/frequency: black foam to wound bed.  Drape to intact skin on dorsal foot and black foam bridged to foot to offload pressure from [facility] pad.  Seal achieved at 125 mmHg.  Change Mon.WEd.Fri.  Anticipate discharge soon.  Will follow.  Estrellita Ludwig MSN, RN, FNP-BC CWON Wound, Ostomy, Continence Nurse [facility] [phone] Pager [phone]

## 2022-11-10 NOTE — TOC Progression Note (Addendum)
Transition of Care [facility]) - Progression Note    Patient Details  Name: Joshua [name] MRN: [id number] Date of Birth: 16-May-1956  Transition of Care [facility]) CM/SW [place], RN Phone Number: 11/10/2022, 1:37 PM  Clinical Narrative:    CM spoke with Nunzio Cobbs RN regarding patient's likely admission to [facility] SNF once insurance is approved - currently pending at this time.  Ma Rings is aware that patient will likely admit to SNF for a few weeks per insurance company determination but will need continued housing after facility.  She will follow up with the patient in the SNF if approved.  I called and spoke with RNCM with [facility] is requesting additional clinicals regarding antibiotics.  Therapy notes and latest ID notes uploaded in [facility] for review - insurance is pending at this time.  CM will continue to follow for SNF placement - [facility] pending at this time.  I spoke with Leontine Locket, CM with [facility] wound vac and she will send an e-script to Dr. Loel Lofty to sign.  I called [facility] and they will providing [facility] RN once patient discharges to son's home.  Patient can discharge home to son's home once [facility] wound vac is approved and is delivered to hospital room - not likely til MOnday.   Expected Discharge Plan: [place] Barriers to Discharge: Continued Medical Work up, Homeless with medical needs  Expected Discharge Plan and Services Expected Discharge Plan: [place]   Discharge Planning Services: CM Consult   Living arrangements for the past 2 months: Homeless (currently staying with a roommate in community housing room with no running water)                                       Social Determinants of Health (SDOH) Interventions    Readmission Risk Interventions    11/08/2022    2:21 PM  Readmission Risk Prevention Plan  Transportation Screening Complete  PCP or Specialist Appt  within 5-7 Days Complete  Home Care Screening Complete  Medication Review (RN CM) Complete

## 2022-11-10 NOTE — Inpatient Diabetes Management (Signed)
Inpatient Diabetes Program Recommendations  AACE/ADA: New Consensus Statement on Inpatient Glycemic Control (2015)  Target Ranges:  Prepandial:   less than 140 mg/dL      Peak postprandial:   less than 180 mg/dL (1-2 hours)      Critically ill patients:  140 - 180 mg/dL   Lab Results  Component Value Date   GLUCAP 162 (H) 11/10/2022   HGBA1C 7.6 (A) 08/26/2022    Review of Glycemic Control  Diabetes history: DM2 Outpatient Diabetes medications:  Lantus 30 units BID Humalog 10-18 units TID Ozempic 1 mg every Monday Current orders for Inpatient glycemic control:  Novolog 0-20 units TID and 0-5 units QHS, Farxiga 10 units QD, Tradjenta 5 mg QD   Inpatient Diabetes Program Recommendations:     Postprandial glucose elevated.  Please consider,   Novolog 3 units TID with meals if consumes at least 50%.   Will continue to follow while inpatient.   Thank you, Reche Dixon, MSN, [place] Diabetes Coordinator Inpatient Diabetes Program [phone] (team pager from 8a-5p)

## 2022-11-10 NOTE — Congregational Nurse Program (Signed)
  Dept: [phone]   Congregational Nurse Program Note  Date of Encounter: 11/10/2022  Past Medical History: Past Medical History:  Diagnosis Date   Acute coronary syndrome ([place])    Anemia    B12 deficiency    Chest pain    CHF (congestive heart failure) (HCC)    Chronic combined systolic and diastolic CHF (congestive heart failure) ([place])    a. 07/2015 Echo: EF 45-50%, mod LVH, mid apical/antsept AK, Gr 1 DD.   Chronic low back pain    CKD (chronic kidney disease), stage III (HCC)    Constipation    COPD (chronic obstructive pulmonary disease) (HCC)    Coronary artery disease    a. 2012 s/p MI/PCI: s/p DES to mid/dist RCA and overlapping DES to LAD;  b. 07/2015 Cath: LAD 10% ISR, D1 40(jailed), LCX 4m, OM2 30, OM3 30, RCA 22m ISR, 10d ISR; c. 07/2016 NSTEMI/PCI: LAD 85ost/p/m (3.5x20 Synergy DES overlapping prior stent), D1 40, LCX 65m, OM2 95 (2.75x16 Synergy DES), OM3 30, RCA 56m, 10d.   Depression    Diabetic gastroparesis ([place])    Diabetic polyneuropathy ([place])    DKA (diabetic ketoacidoses) 03/14/2018   GERD (gastroesophageal reflux disease)    Gout    Heart failure (HCC)    Heart murmur    History of MI (myocardial infarction)    Hyperlipemia    Hypertension    Hypertensive heart disease    Hypokalemia    Ischemic cardiomyopathy    a. 07/2015 Echo: EF 45-50%, mod LVH, mid-apicalanteroseptal AK, Gr1 DD.   Joint pain    Kidney problem    Morbid obesity ([place])    OSA on CPAP    Osteoarthritis    Osteomyelitis ([place])    a. 07/2016 MTP joint of R great toe s/p transmetatarsal amputation.   Other fatigue    Polyneuropathy in diabetes(357.2)    Pulmonary sarcoidosis ([place])    "problems w/it years ago; no problems anymore" (07/03/2014)   Rectal bleeding    Shortness of breath    Shortness of breath on exertion    Sleep apnea    Stomach ulcer    Swallowing difficulty    Type II diabetes mellitus ([place])     Encounter Details:  I'm the community nurse working with Mr.  Joshua [name]. I spoke with him yesterday and he mentioned being transferred to a facility to continue antibiotic infusions. I contacted the inpatient treatment team to ask about the anticipated length of stay at the facility as I will be working with him to find him a place to stay, he's currently living in a pallet home with no running water prior to this he was living in his car. We would like to find him a place to stay before being discharge from the facility. [employer]

## 2022-11-11 DIAGNOSIS — M869 Osteomyelitis, unspecified: Secondary | ICD-10-CM | POA: Diagnosis not present

## 2022-11-11 LAB — GLUCOSE, CAPILLARY
Glucose-Capillary: 167 mg/dL — ABNORMAL HIGH (ref 70–99)
Glucose-Capillary: 164 mg/dL — ABNORMAL HIGH (ref 70–99)
Glucose-Capillary: 213 mg/dL — ABNORMAL HIGH (ref 70–99)
Glucose-Capillary: 189 mg/dL — ABNORMAL HIGH (ref 70–99)

## 2022-11-11 NOTE — Progress Notes (Signed)
Mobility Specialist Progress Note   11/11/22 1615  Mobility  Activity Ambulated with assistance in hallway  Level of Assistance Modified independent, requires aide device or extra time  Assistive Device [facility] Ambulated (ft) 360 ft  Range of Motion/Exercises Active;All extremities  LLE Weight Bearing WBAT  Activity Response Tolerated well   Patient received in recliner chair and agreeable to participate. Ambulated mod I with steady gait. Returned to room without complaint or incident. Was left in recliner with all needs met, call bell in reach.   Joshua [name], BS EXP Mobility Specialist Please contact via SecureChat or Rehab office at [phone]

## 2022-11-11 NOTE — Progress Notes (Signed)
Pt self administered CPAP.

## 2022-11-11 NOTE — Progress Notes (Signed)
PROGRESS NOTE    QUE MENEELY  JKK:[id number] DOB: Jun 13, 1956 DOA: 11/05/2022 PCP: [name], MD   Brief Narrative:  66 year old male with heart failure with preserved EF, CAD status post PCI and stenting, CKD stage IV, diabetes mellitus type 2, COPD, hypertension, obesity, prior right transmetatarsal amputation presented with worsening left foot wound despite outpatient oral antibiotics.  He was found to have left foot osteomyelitis.  He was started on broad-spectrum antibiotics.  Podiatry and ID were consulted.  He underwent bone biopsy and wound VAC placement.  Wound culture grew Proteus mirabilis: Antibiotics have been switched to Augmentin by ID.  PT recommended SNF placement.  He is currently medically stable for discharge.  Assessment & Plan:   Left foot fifth toe osteomyelitis Chronic wound on left lateral aspect of the left foot History of right TMA -He was started on broad-spectrum antibiotics.  Podiatry and ID were consulted.  He underwent bone biopsy and wound VAC placement.  Wound culture grew Proteus mirabilis: Antibiotics have been switched to Augmentin by ID.  Currently also on doxycycline.  Will need doxycycline and Augmentin for 4 weeks as per ID recommendations with outpatient follow-up with ID on 11/18/2022.  ID has cleared the patient for discharge. -Podiatry signed off on 11/08/2022.  Outpatient follow-up with podiatry 1 week after discharge -ABI on 11/01/2022 were normal - PT recommended SNF placement.  Social worker following  CAD status post multiple PCI's Chronic diastolic heart failure Hyperlipidemia -Last echo in 08/2021  revealed grade 2 diastolic dysfunction, moderate dilatation of the left atrium and an EF of 55 to 60% -Continue Brilinta, aspirin, Lipitor, Imdur and Toprol-XL.  Strict input and output.  Daily weights.  Fluid restriction.  Outpatient follow-up with cardiology  CKD stage IV -Creatinine at baseline.  Monitor intermittently.  Diabetes  mellitus type 2 with hyperglycemia Diabetic neuropathy -Continue CBGs with SSI.  Continue Farxiga and gabapentin -A1c 7.6.  Continue linagliptin  Morbid obesity -Outpatient follow-up  Leukocytosis -Resolved  Anemia of chronic disease -From chronic kidney disease.  Hemoglobin stable.  Monitor intermittently   DVT prophylaxis: Heparin subcutaneous Code Status: Full Family Communication: None at bedside Disposition Plan: Status is: Inpatient Remains inpatient appropriate because: Of severity of illness.  Need for SNF placement.  Currently medically stable for discharge.  Consultants: Podiatry/ID  Procedures: As above  Antimicrobials:  Anti-infectives (From admission, onward)    Start     Dose/Rate Route Frequency Ordered Stop   11/09/22 1400  ceFAZolin (ANCEF) IVPB 1 g/50 mL premix  Status:  Discontinued        1 g 100 mL/hr over 30 Minutes Intravenous Every 8 hours 11/09/22 1050 11/09/22 1056   11/09/22 1145  doxycycline (VIBRA-TABS) tablet 100 mg        100 mg Oral Every 12 hours 11/09/22 1056     11/09/22 1145  amoxicillin-clavulanate (AUGMENTIN) 875-125 MG per tablet 1 tablet        1 tablet Oral Every 12 hours 11/09/22 1056     11/06/22 2300  vancomycin (VANCOCIN) IVPB 1000 mg/200 mL premix  Status:  Discontinued        1,000 mg 200 mL/hr over 60 Minutes Intravenous Every 24 hours 11/05/22 2157 11/05/22 2352   11/06/22 0600  piperacillin-tazobactam (ZOSYN) IVPB 3.375 g  Status:  Discontinued        3.375 g 12.5 mL/hr over 240 Minutes Intravenous Every 8 hours 11/05/22 2153 11/05/22 2352   11/05/22 2200  piperacillin-tazobactam (ZOSYN) IVPB 3.375 g  3.375 g 100 mL/hr over 30 Minutes Intravenous  Once 11/05/22 2146 11/05/22 2312   11/05/22 2200  vancomycin (VANCOCIN) 2,500 mg in sodium chloride 0.9 % 500 mL IVPB  Status:  Discontinued        2,500 mg 262.5 mL/hr over 120 Minutes Intravenous  Once 11/05/22 2148 11/05/22 2352        Subjective: Patient seen  and examined at bedside.  No fever, chest pain, worsening shortness of breath or vomiting reported. Objective: Vitals:   11/10/22 1026 11/10/22 2018 11/11/22 0418 11/11/22 0446  BP: (!) 141/78 (!) 158/83 (!) 153/83   Pulse: 94 93 85   Resp: $Remo'18 16 16   'iJxVR$ Temp: 98.7 F (37.1 C) 99 F (37.2 C) 98.7 F (37.1 C)   TempSrc: Oral Oral    SpO2: 100% 100% 99%   Weight:    125.3 kg  Height:        Intake/Output Summary (Last 24 hours) at 11/11/2022 0733 Last data filed at 11/11/2022 0445 Gross per 24 hour  Intake 480 ml  Output 1435 ml  Net -955 ml    Filed Weights   11/09/22 0500 11/10/22 0420 11/11/22 0446  Weight: 125.7 kg 125.6 kg 125.3 kg    Examination:  General exam: Currently on room air.  No acute distress. Respiratory system: Decreased breath sounds at bases bilaterally with some crackles  cardiovascular system: Mostly rate controlled; S1 and S2 are heard gastrointestinal system: Abdomen is morbidly obese, distended slightly; soft and nontender.  Bowel sound are heard normally  extremities: Trace lower extremity edema present.  Wound VAC present on left foot.  Right TMA present.   Data Reviewed: I have personally reviewed following labs and imaging studies  CBC: Recent Labs  Lab 11/05/22 1643 11/06/22 0514  WBC 12.5* 9.0  NEUTROABS 9.6*  --   HGB 12.6* 12.1*  HCT 39.8 38.6*  MCV 87.3 87.9  PLT 272 662    Basic Metabolic Panel: Recent Labs  Lab 11/05/22 1643 11/06/22 0514 11/06/22 0800 11/07/22 0430 11/08/22 0428 11/10/22 0607  NA 135 136  --  139 136 135  K 3.1* 2.9*  --  3.8 3.7 3.8  CL 99 103  --  104 102 103  CO2 22 21*  --  $R'23 24 23  'uc$ GLUCOSE 214* 101*  --  143* 166* 177*  BUN 30* 29*  --  32* 36* 50*  CREATININE 2.44* 2.21*  --  2.14* 2.68* 2.01*  CALCIUM 8.9 8.6*  --  9.4 8.8* 8.7*  MG  --   --  2.1  --   --   --     GFR: Estimated Creatinine Clearance: 48.6 mL/min (A) (by C-G formula based on SCr of 2.01 mg/dL (H)). Liver Function  Tests: No results for input(s): "AST", "ALT", "ALKPHOS", "BILITOT", "PROT", "ALBUMIN" in the last 168 hours. No results for input(s): "LIPASE", "AMYLASE" in the last 168 hours. No results for input(s): "AMMONIA" in the last 168 hours. Coagulation Profile: No results for input(s): "INR", "PROTIME" in the last 168 hours. Cardiac Enzymes: No results for input(s): "CKTOTAL", "CKMB", "CKMBINDEX", "TROPONINI" in the last 168 hours. BNP (last 3 results) No results for input(s): "PROBNP" in the last 8760 hours. HbA1C: No results for input(s): "HGBA1C" in the last 72 hours. CBG: Recent Labs  Lab 11/09/22 2214 11/10/22 0711 11/10/22 1157 11/10/22 1610 11/10/22 2208  GLUCAP 272* 162* 217* 171* 271*    Lipid Profile: No results for input(s): "CHOL", "HDL", "LDLCALC", "TRIG", "CHOLHDL", "  LDLDIRECT" in the last 72 hours. Thyroid Function Tests: No results for input(s): "TSH", "T4TOTAL", "FREET4", "T3FREE", "THYROIDAB" in the last 72 hours. Anemia Panel: No results for input(s): "VITAMINB12", "FOLATE", "FERRITIN", "TIBC", "IRON", "RETICCTPCT" in the last 72 hours. Sepsis Labs: No results for input(s): "PROCALCITON", "LATICACIDVEN" in the last 168 hours.  Recent Results (from the past 240 hour(s))  Surgical PCR screen     Status: None   Collection Time: 11/06/22  5:42 PM   Specimen: Nasal Mucosa; Nasal Swab  Result Value Ref Range Status   MRSA, PCR NEGATIVE NEGATIVE Final   Staphylococcus aureus NEGATIVE NEGATIVE Final    Comment: (NOTE) The Xpert SA Assay (FDA approved for NASAL specimens in patients 78 years of age and older), is one component of a comprehensive surveillance program. It is not intended to diagnose infection nor to guide or monitor treatment. Performed at [facility], [address], [place], [place] [address]   Aerobic/Anaerobic Culture w Gram Stain (surgical/deep wound)     Status: None (Preliminary result)   Collection Time: 11/07/22 12:23 PM   Specimen:  Wound  Result Value Ref Range Status   Specimen Description BONE  Final   Special Requests LEFT FOOT  Final   Gram Stain   Final    NO ORGANISMS SEEN NO WBC SEEN Performed at [facility], [address], [place], [place] [address]    Culture   Final    RARE PROTEUS MIRABILIS NO ANAEROBES ISOLATED; CULTURE IN PROGRESS FOR 5 DAYS    Report Status PENDING  Incomplete   Organism ID, Bacteria PROTEUS MIRABILIS  Final      Susceptibility   Proteus mirabilis - MIC*    AMPICILLIN >=32 RESISTANT Resistant     CEFAZOLIN 8 SENSITIVE Sensitive     CEFEPIME <=0.12 SENSITIVE Sensitive     CEFTAZIDIME <=1 SENSITIVE Sensitive     CEFTRIAXONE <=0.25 SENSITIVE Sensitive     CIPROFLOXACIN <=0.25 SENSITIVE Sensitive     GENTAMICIN <=1 SENSITIVE Sensitive     IMIPENEM 2 SENSITIVE Sensitive     TRIMETH/SULFA >=320 RESISTANT Resistant     AMPICILLIN/SULBACTAM 8 SENSITIVE Sensitive     PIP/TAZO <=4 SENSITIVE Sensitive     * RARE PROTEUS MIRABILIS  Aerobic/Anaerobic Culture w Gram Stain (surgical/deep wound)     Status: None (Preliminary result)   Collection Time: 11/07/22 12:24 PM   Specimen: PATH Bone resection; Tissue  Result Value Ref Range Status   Specimen Description WOUND  Final   Special Requests LEFT FOOT DEEP  Final   Gram Stain   Final    NO ORGANISMS SEEN NO WBC SEEN Performed at [facility], [address] [place], [place] [address]    Culture   Final    FEW PROTEUS MIRABILIS SUSCEPTIBILITIES PERFORMED ON PREVIOUS CULTURE WITHIN THE LAST 5 DAYS. NO ANAEROBES ISOLATED; CULTURE IN PROGRESS FOR 5 DAYS    Report Status PENDING  Incomplete         Radiology Studies: No results found.      Scheduled Meds:  amoxicillin-clavulanate  1 tablet Oral Q12H   ascorbic acid  500 mg Oral BID   aspirin EC  81 mg Oral Daily   atorvastatin  80 mg Oral Daily   dapagliflozin propanediol  10 mg Oral Daily   doxycycline  100 mg Oral Q12H   feeding supplement (GLUCERNA  SHAKE)  237 mL Oral TID BM   gabapentin  100 mg Oral QHS  heparin  5,000 Units Subcutaneous Q8H   insulin aspart  0-20 Units Subcutaneous TID WC   insulin aspart  0-5 Units Subcutaneous QHS   insulin aspart  3 Units Subcutaneous TID WC   isosorbide mononitrate  180 mg Oral Daily   latanoprost  1 drop Both Eyes QHS   linagliptin  5 mg Oral Daily   metoprolol succinate  75 mg Oral Daily   multivitamin with minerals  1 tablet Oral Daily   mupirocin ointment  1 Application Nasal BID   nutrition supplement (JUVEN)  1 packet Oral BID BM   pantoprazole  40 mg Oral BID   ticagrelor  90 mg Oral BID   Continuous Infusions:        Aline August, MD [facility] 11/11/2022, 7:33 AM

## 2022-11-12 DIAGNOSIS — M869 Osteomyelitis, unspecified: Secondary | ICD-10-CM | POA: Diagnosis not present

## 2022-11-12 LAB — AEROBIC/ANAEROBIC CULTURE W GRAM STAIN (SURGICAL/DEEP WOUND)
Gram Stain: NONE SEEN
Gram Stain: NONE SEEN

## 2022-11-12 LAB — GLUCOSE, CAPILLARY
Glucose-Capillary: 181 mg/dL — ABNORMAL HIGH (ref 70–99)
Glucose-Capillary: 185 mg/dL — ABNORMAL HIGH (ref 70–99)
Glucose-Capillary: 213 mg/dL — ABNORMAL HIGH (ref 70–99)
Glucose-Capillary: 247 mg/dL — ABNORMAL HIGH (ref 70–99)

## 2022-11-12 MED ORDER — BISACODYL 10 MG RE SUPP: 10.0000 mg | Freq: Every day | RECTAL | Status: DC | PRN

## 2022-11-12 MED ORDER — POLYETHYLENE GLYCOL 3350 17 G PO PACK
17.0000 g | PACK | Freq: Every day | ORAL | Status: DC | PRN
  Administered 2022-11-13 – 2022-11-15 (×2): 17 g via ORAL
  Filled 2022-11-12: qty 1

## 2022-11-12 MED ORDER — SENNOSIDES-DOCUSATE SODIUM 8.6-50 MG PO TABS
1.0000 | ORAL_TABLET | Freq: Two times a day (BID) | ORAL | Status: DC
  Administered 2022-11-12 – 2022-11-16 (×7): 1 via ORAL
  Filled 2022-11-12 (×8): qty 1

## 2022-11-12 NOTE — TOC Progression Note (Addendum)
Transition of Care [facility]) - Progression Note    Patient Details  Name: Joshua [name] MRN: [id number] Date of Birth: [birth year]  Transition of Care [facility]) CM/SW Rachel, RN Phone Number: 11/12/2022, 8:35 AM  Clinical Narrative:    CM received a call from [facility] with [facility] requesting Peer to Peer with attending physician, Dr. Starla Link today.  I called Dr. Starla Link and requested call back regarding request - MD is asked to call [phone] option 5 for patient ID # [id number].  Podiatrist has asked that patient be placed a SNF for wound care post-surgery including wound vac and po antibiotics.  [facility] is currently following the patient in the community since patient is living in pallet housing in [place] offered to homeless members of the community.  Hospital leadership is aware and following.  Plan is for SNF placement - otherwise arrangements will be made for temporary housing accessible for home health services if SNF placement is declined by [employer] provider.  CM will continue to follow for SNF placement with wound care/ wound vac needs.  11/12/2022 1000 - Clinicals were provided to [facility] for medical determination for SNF placement.  Attending MD is aware that clinicals will be reviewed alone without a peer-to-peer available.  I spoke with the patient and updated him that insurance provider is reviewing at this time.  I asked that patient to speak with his son to see if patient is able to stay with him at his apartment instead of returning to the Pallet housing.  The patient does not have funds for hotel accommodations.  [facility] has offered RN services if available housing is found for the patient if insurance company declines SNF admission.  CM will call [facility] to follow the patient as well for needed [place] if SNF is declined.  Patient will remain inpatient at this time since safe discharge plan is pending at this time.   MD aware.  11/12/2022 1018 - Additional WOC notes and progress notes updated in [facility] for SNF determination today.  I called Leontine Locket, CM with [facility] and asked her to be on standby for wound vac in case I need to find stable housing for wound care needs through [place].  11/12/2022 1305  CM spoke with [facility] and patient was denied SNF placement under [facility] Medicare.  I spoke with hospital leadership, Elana Alm and [facility] will be offered for room and board for accepting SNF - under patient's current Medicaid.  I called and spoke with Gayla Medicus, CM and sent clinicals out in the hub to [facility] in [place] [facility] [place].  I also called and left a message with admissions at [facility].  11/12/2022 1353 - CM spoke with patient and he is unable to give up his disability check if he admits under his Medicaid policy.  The patient states that he would prefer to discharge to his son's home on Monday or Tuesday once a wound vac frpm [facility] is obtained.  I called Leontine Locket, CM with [facility] and requested start of insurance for [facility] wound vac.  CM will continue to follow the patient for discharge needs - next week - son's address [address] [place], [place] [address].  Expected Discharge Plan: Skilled Nursing Facility Barriers to Discharge: Continued Medical Work up, Homeless with medical needs  Expected Discharge Plan and Services Expected Discharge Plan: [place]   Discharge Planning Services: CM Consult  Living arrangements for the past 2 months: Homeless (currently staying with a roommate in community housing room with no running water)                                       Social Determinants of Health (SDOH) Interventions    Readmission Risk Interventions    11/08/2022    2:21 PM  Readmission Risk Prevention Plan  Transportation Screening Complete  PCP or Specialist Appt within 5-7 Days Complete  Home Care Screening Complete   Medication Review (RN CM) Complete

## 2022-11-12 NOTE — Consult Note (Addendum)
[place] Nurse wound follow up Wound type: surgical/left foot wound debridement 11/07/22 With Integra graft placed  Measurement: 4cm x 6cm  Wound bed: skin substitute in place; stapled in place Drainage (amount, consistency, odor) scant in canister, serosanguinous; musty odor  Periwound: superficial skin peeling; related to neuropathy Dressing procedure/placement/frequency: Removed old NPWT dressing Periwound skin protected with skin barrier wipe  Skin protected to the with VAC drape to the dorsal foot for foam bridge  wound covered with  _1__ piece of black foam.  One piece of black foam used as bridge  Sealed NPWT dressing at 168mm HG Patient received PO pain medication per bedside nurse prior to dressing change Patient tolerated procedure well Note: TRAC pad was pulled off of the patient's foot when I arrived, unit alarming.  Apparently happened when patient transferred to the chair this am.   Requested bedside nursing to wrap foot with 4" ACE wrap to prevent tubing/TRAC pad from being pulled during transfers or ambulation.  Explained process for DC if patient goes to rehab either disconnecting and clamping tubing for transfer  [place] nurse will continue to provide NPWT dressing changed due to the complexity of the dressing change.     [place], [place], [place]

## 2022-11-12 NOTE — Progress Notes (Signed)
PROGRESS NOTE    Joshua [name]  ZMC:[id number] DOB: 01-25-1956 DOA: 11/05/2022 PCP: [name], MD   Brief Narrative:  66 year old male with heart failure with preserved EF, CAD status post PCI and stenting, CKD stage IV, diabetes mellitus type 2, COPD, hypertension, obesity, prior right transmetatarsal amputation presented with worsening left foot wound despite outpatient oral antibiotics.  He was found to have left foot osteomyelitis.  He was started on broad-spectrum antibiotics.  Podiatry and ID were consulted.  He underwent bone biopsy and wound VAC placement.  Wound culture grew Proteus mirabilis: Antibiotics have been switched to Augmentin by ID.  PT recommended SNF placement.  He is currently medically stable for discharge.  Assessment & Plan:   Left foot fifth toe osteomyelitis Chronic wound on left lateral aspect of the left foot History of right TMA -He was started on broad-spectrum antibiotics.  Podiatry and ID were consulted.  He underwent bone biopsy and wound VAC placement.  Wound culture grew Proteus mirabilis: Antibiotics have been switched to Augmentin by ID.  Currently also on doxycycline.  Will need doxycycline and Augmentin for 4 weeks as per ID recommendations with outpatient follow-up with ID on 11/18/2022.  ID has cleared the patient for discharge. -Podiatry signed off on 11/08/2022.  Outpatient follow-up with podiatry 1 week after discharge -ABI on 11/01/2022 were normal - PT recommended SNF placement.  Social worker following  CAD status post multiple PCI's Chronic diastolic heart failure Hyperlipidemia -Last echo in 08/2021  revealed grade 2 diastolic dysfunction, moderate dilatation of the left atrium and an EF of 55 to 60% -Continue Brilinta, aspirin, Lipitor, Imdur and Toprol-XL.  Strict input and output.  Daily weights.  Fluid restriction.  Outpatient follow-up with cardiology  CKD stage IV -Creatinine at baseline.  Monitor intermittently.  Diabetes  mellitus type 2 with hyperglycemia Diabetic neuropathy -Continue CBGs with SSI.  Continue Farxiga and gabapentin -A1c 7.6.  Continue linagliptin  Morbid obesity -Outpatient follow-up  Leukocytosis -Resolved  Anemia of chronic disease -From chronic kidney disease.  Hemoglobin stable.  Monitor intermittently   DVT prophylaxis: Heparin subcutaneous Code Status: Full Family Communication: None at bedside Disposition Plan: Status is: Inpatient Remains inpatient appropriate because: Of severity of illness.  Need for SNF placement.  Currently medically stable for discharge.  Consultants: Podiatry/ID  Procedures: As above  Antimicrobials:  Anti-infectives (From admission, onward)    Start     Dose/Rate Route Frequency Ordered Stop   11/09/22 1400  ceFAZolin (ANCEF) IVPB 1 g/50 mL premix  Status:  Discontinued        1 g 100 mL/hr over 30 Minutes Intravenous Every 8 hours 11/09/22 1050 11/09/22 1056   11/09/22 1145  doxycycline (VIBRA-TABS) tablet 100 mg        100 mg Oral Every 12 hours 11/09/22 1056     11/09/22 1145  amoxicillin-clavulanate (AUGMENTIN) 875-125 MG per tablet 1 tablet        1 tablet Oral Every 12 hours 11/09/22 1056     11/06/22 2300  vancomycin (VANCOCIN) IVPB 1000 mg/200 mL premix  Status:  Discontinued        1,000 mg 200 mL/hr over 60 Minutes Intravenous Every 24 hours 11/05/22 2157 11/05/22 2352   11/06/22 0600  piperacillin-tazobactam (ZOSYN) IVPB 3.375 g  Status:  Discontinued        3.375 g 12.5 mL/hr over 240 Minutes Intravenous Every 8 hours 11/05/22 2153 11/05/22 2352   11/05/22 2200  piperacillin-tazobactam (ZOSYN) IVPB 3.375 g  3.375 g 100 mL/hr over 30 Minutes Intravenous  Once 11/05/22 2146 11/05/22 2312   11/05/22 2200  vancomycin (VANCOCIN) 2,500 mg in sodium chloride 0.9 % 500 mL IVPB  Status:  Discontinued        2,500 mg 262.5 mL/hr over 120 Minutes Intravenous  Once 11/05/22 2148 11/05/22 2352        Subjective: Patient seen  and examined at bedside.  Denies worsening shortness of breath, fever, vomiting or chest pain.   Objective: Vitals:   11/12/22 0008 11/12/22 0542 11/12/22 0547 11/12/22 0730  BP:  (!) 141/80  117/82  Pulse:  92  92  Resp: $Remo'18 16  18  'KYcGY$ Temp:  98.3 F (36.8 C)  98.6 F (37 C)  TempSrc:  Oral  Oral  SpO2:  97%  99%  Weight:   126.2 kg   Height:        Intake/Output Summary (Last 24 hours) at 11/12/2022 0753 Last data filed at 11/11/2022 2133 Gross per 24 hour  Intake 948 ml  Output 700 ml  Net 248 ml    Filed Weights   11/10/22 0420 11/11/22 0446 11/12/22 0547  Weight: 125.6 kg 125.3 kg 126.2 kg    Examination:  General exam: No distress.  Still on room air.   Respiratory system: Bilateral decreased breath sounds at bases with scattered crackles  cardiovascular system: S1-S2 heard; rate mostly controlled  gastrointestinal system: Abdomen is morbidly obese, mildly distended; soft and nontender.  Normal bowel sounds heard  extremities: Trace lower extremity edema present.  Wound VAC present on left foot.  Right TMA present.   Data Reviewed: I have personally reviewed following labs and imaging studies  CBC: Recent Labs  Lab 11/05/22 1643 11/06/22 0514  WBC 12.5* 9.0  NEUTROABS 9.6*  --   HGB 12.6* 12.1*  HCT 39.8 38.6*  MCV 87.3 87.9  PLT 272 500    Basic Metabolic Panel: Recent Labs  Lab 11/05/22 1643 11/06/22 0514 11/06/22 0800 11/07/22 0430 11/08/22 0428 11/10/22 0607  NA 135 136  --  139 136 135  K 3.1* 2.9*  --  3.8 3.7 3.8  CL 99 103  --  104 102 103  CO2 22 21*  --  $R'23 24 23  'lb$ GLUCOSE 214* 101*  --  143* 166* 177*  BUN 30* 29*  --  32* 36* 50*  CREATININE 2.44* 2.21*  --  2.14* 2.68* 2.01*  CALCIUM 8.9 8.6*  --  9.4 8.8* 8.7*  MG  --   --  2.1  --   --   --     GFR: Estimated Creatinine Clearance: 48.8 mL/min (A) (by C-G formula based on SCr of 2.01 mg/dL (H)). Liver Function Tests: No results for input(s): "AST", "ALT", "ALKPHOS", "BILITOT",  "PROT", "ALBUMIN" in the last 168 hours. No results for input(s): "LIPASE", "AMYLASE" in the last 168 hours. No results for input(s): "AMMONIA" in the last 168 hours. Coagulation Profile: No results for input(s): "INR", "PROTIME" in the last 168 hours. Cardiac Enzymes: No results for input(s): "CKTOTAL", "CKMB", "CKMBINDEX", "TROPONINI" in the last 168 hours. BNP (last 3 results) No results for input(s): "PROBNP" in the last 8760 hours. HbA1C: No results for input(s): "HGBA1C" in the last 72 hours. CBG: Recent Labs  Lab 11/10/22 2208 11/11/22 0745 11/11/22 1152 11/11/22 1644 11/11/22 2146  GLUCAP 271* 167* 164* 213* 189*    Lipid Profile: No results for input(s): "CHOL", "HDL", "LDLCALC", "TRIG", "CHOLHDL", "LDLDIRECT" in the last 72 hours.  Thyroid Function Tests: No results for input(s): "TSH", "T4TOTAL", "FREET4", "T3FREE", "THYROIDAB" in the last 72 hours. Anemia Panel: No results for input(s): "VITAMINB12", "FOLATE", "FERRITIN", "TIBC", "IRON", "RETICCTPCT" in the last 72 hours. Sepsis Labs: No results for input(s): "PROCALCITON", "LATICACIDVEN" in the last 168 hours.  Recent Results (from the past 240 hour(s))  Surgical PCR screen     Status: None   Collection Time: 11/06/22  5:42 PM   Specimen: Nasal Mucosa; Nasal Swab  Result Value Ref Range Status   MRSA, PCR NEGATIVE NEGATIVE Final   Staphylococcus aureus NEGATIVE NEGATIVE Final    Comment: (NOTE) The Xpert SA Assay (FDA approved for NASAL specimens in patients 69 years of age and older), is one component of a comprehensive surveillance program. It is not intended to diagnose infection nor to guide or monitor treatment. Performed at [facility], [place] [address] [place], [place] [address]   Aerobic/Anaerobic Culture w Gram Stain (surgical/deep wound)     Status: None (Preliminary result)   Collection Time: 11/07/22 12:23 PM   Specimen: Wound  Result Value Ref Range Status   Specimen Description BONE   Final   Special Requests LEFT FOOT  Final   Gram Stain   Final    NO ORGANISMS SEEN NO WBC SEEN Performed at [facility], [place] [address] [place], [place] [address]    Culture   Final    RARE PROTEUS MIRABILIS NO ANAEROBES ISOLATED; CULTURE IN PROGRESS FOR 5 DAYS    Report Status PENDING  Incomplete   Organism ID, Bacteria PROTEUS MIRABILIS  Final      Susceptibility   Proteus mirabilis - MIC*    AMPICILLIN >=32 RESISTANT Resistant     CEFAZOLIN 8 SENSITIVE Sensitive     CEFEPIME <=0.12 SENSITIVE Sensitive     CEFTAZIDIME <=1 SENSITIVE Sensitive     CEFTRIAXONE <=0.25 SENSITIVE Sensitive     CIPROFLOXACIN <=0.25 SENSITIVE Sensitive     GENTAMICIN <=1 SENSITIVE Sensitive     IMIPENEM 2 SENSITIVE Sensitive     TRIMETH/SULFA >=320 RESISTANT Resistant     AMPICILLIN/SULBACTAM 8 SENSITIVE Sensitive     PIP/TAZO <=4 SENSITIVE Sensitive     * RARE PROTEUS MIRABILIS  Aerobic/Anaerobic Culture w Gram Stain (surgical/deep wound)     Status: None (Preliminary result)   Collection Time: 11/07/22 12:24 PM   Specimen: PATH Bone resection; Tissue  Result Value Ref Range Status   Specimen Description WOUND  Final   Special Requests LEFT FOOT DEEP  Final   Gram Stain   Final    NO ORGANISMS SEEN NO WBC SEEN Performed at [facility], [address], [place], [place] [address]    Culture   Final    FEW PROTEUS MIRABILIS SUSCEPTIBILITIES PERFORMED ON PREVIOUS CULTURE WITHIN THE LAST 5 DAYS. NO ANAEROBES ISOLATED; CULTURE IN PROGRESS FOR 5 DAYS    Report Status PENDING  Incomplete         Radiology Studies: No results found.      Scheduled Meds:  amoxicillin-clavulanate  1 tablet Oral Q12H   ascorbic acid  500 mg Oral BID   aspirin EC  81 mg Oral Daily   atorvastatin  80 mg Oral Daily   dapagliflozin propanediol  10 mg Oral Daily   doxycycline  100 mg Oral Q12H   feeding supplement (GLUCERNA SHAKE)  237 mL Oral TID BM   gabapentin  100 mg Oral QHS   heparin   5,000 Units Subcutaneous  Q8H   insulin aspart  0-20 Units Subcutaneous TID WC   insulin aspart  0-5 Units Subcutaneous QHS   insulin aspart  3 Units Subcutaneous TID WC   isosorbide mononitrate  180 mg Oral Daily   latanoprost  1 drop Both Eyes QHS   linagliptin  5 mg Oral Daily   metoprolol succinate  75 mg Oral Daily   multivitamin with minerals  1 tablet Oral Daily   nutrition supplement (JUVEN)  1 packet Oral BID BM   pantoprazole  40 mg Oral BID   ticagrelor  90 mg Oral BID   Continuous Infusions:        Aline August, MD [facility] 11/12/2022, 7:53 AM

## 2022-11-12 NOTE — Progress Notes (Signed)
Mobility Specialist Progress Note:   11/12/22 1343  Mobility  Activity Ambulated with assistance in hallway  Level of Assistance Modified independent, requires aide device or extra time  Assistive Device Cane  Distance Ambulated (ft) 400 ft  Activity Response Tolerated well  $Mobility charge 1 Mobility   Pt received in chair willing to participate in mobility. No complaints of pain. Left in chair with call bell in reach and all needs met.  Gareth Eagle [place] Mobility Specialist Please contact via [employer] or  Rehab Office at [phone]

## 2022-11-13 ENCOUNTER — Encounter: Payer: Self-pay | Admitting: *Deleted

## 2022-11-13 DIAGNOSIS — M869 Osteomyelitis, unspecified: Secondary | ICD-10-CM | POA: Diagnosis not present

## 2022-11-13 LAB — GLUCOSE, CAPILLARY
Glucose-Capillary: 166 mg/dL — ABNORMAL HIGH (ref 70–99)
Glucose-Capillary: 196 mg/dL — ABNORMAL HIGH (ref 70–99)
Glucose-Capillary: 182 mg/dL — ABNORMAL HIGH (ref 70–99)
Glucose-Capillary: 153 mg/dL — ABNORMAL HIGH (ref 70–99)

## 2022-11-13 MED ORDER — ALLOPURINOL 100 MG PO TABS
100.0000 mg | ORAL_TABLET | Freq: Every day | ORAL | Status: DC
  Administered 2022-11-13 – 2022-11-16 (×4): 100 mg via ORAL
  Filled 2022-11-13 (×4): qty 1

## 2022-11-13 MED ORDER — NITROGLYCERIN 0.4 MG SL SUBL
0.4000 mg | SUBLINGUAL_TABLET | SUBLINGUAL | Status: DC | PRN
  Administered 2022-11-13: 0.4 mg via SUBLINGUAL
  Filled 2022-11-13: qty 1

## 2022-11-13 MED ORDER — ALLOPURINOL 300 MG PO TABS: 300.0000 mg | ORAL_TABLET | Freq: Every day | ORAL | Status: DC

## 2022-11-13 NOTE — Progress Notes (Signed)
Pt said will put on cpap when ready

## 2022-11-13 NOTE — Patient Instructions (Signed)
  [name] has a plan to be discharged from the hospital Monday or Tuesday, insurance denied SNF placement. There is a tentative plan to stay with his son however due to situational dynamics this is may not be the best or medically safe short-term plan. I left a message for Sharyn Lull and secure chat the the case manager, social worker and wound care nurses working today, pending a response. [employer]

## 2022-11-13 NOTE — Progress Notes (Signed)
Starla Link, MD notified of patient with new complaints of chest pain. Pt states he occasionally gets angina at home and subsides with nitroglycerin tablets. MD ordered nitroglycerin PRN. RN administered nitroglycerin x1 with relief within 5 minutes. Pt with no new concerns at this time. Will continue with POC.

## 2022-11-13 NOTE — Congregational Nurse Program (Signed)
  Dept: [phone]   Congregational Nurse Program Note  Date of Encounter: 11/13/2022  Past Medical History: Past Medical History:  Diagnosis Date   Acute coronary syndrome (HCC)    Anemia    B12 deficiency    Chest pain    CHF (congestive heart failure) (HCC)    Chronic combined systolic and diastolic CHF (congestive heart failure) ([place])    a. 07/2015 Echo: EF 45-50%, mod LVH, mid apical/antsept AK, Gr 1 DD.   Chronic low back pain    CKD (chronic kidney disease), stage III (HCC)    Constipation    COPD (chronic obstructive pulmonary disease) (HCC)    Coronary artery disease    a. 2012 s/p MI/PCI: s/p DES to mid/dist RCA and overlapping DES to LAD;  b. 07/2015 Cath: LAD 10% ISR, D1 40(jailed), LCX 33m, OM2 30, OM3 30, RCA 19m ISR, 10d ISR; c. 07/2016 NSTEMI/PCI: LAD 85ost/p/m (3.5x20 Synergy DES overlapping prior stent), D1 40, LCX 50m, OM2 95 (2.75x16 Synergy DES), OM3 30, RCA 13m, 10d.   Depression    Diabetic gastroparesis ([place])    Diabetic polyneuropathy ([place])    DKA (diabetic ketoacidoses) 03/14/2018   GERD (gastroesophageal reflux disease)    Gout    Heart failure (HCC)    Heart murmur    History of MI (myocardial infarction)    Hyperlipemia    Hypertension    Hypertensive heart disease    Hypokalemia    Ischemic cardiomyopathy    a. 07/2015 Echo: EF 45-50%, mod LVH, mid-apicalanteroseptal AK, Gr1 DD.   Joint pain    Kidney problem    Morbid obesity ([place])    OSA on CPAP    Osteoarthritis    Osteomyelitis ([place])    a. 07/2016 MTP joint of R great toe s/p transmetatarsal amputation.   Other fatigue    Polyneuropathy in diabetes(357.2)    Pulmonary sarcoidosis ([place])    "problems w/it years ago; no problems anymore" (07/03/2014)   Rectal bleeding    Shortness of breath    Shortness of breath on exertion    Sleep apnea    Stomach ulcer    Swallowing difficulty    Type II diabetes mellitus ([place])     Encounter Details:  [name] has a plan to be discharged  from the hospital Monday or Tuesday, insurance denied SNF placement. There is a tentative plan to stay with his son however due to situational dynamics this is may not be the best or medically safe short-term plan. I left a message for Joshua [name] and secure chat the the case manager, social worker and wound care nurses working today, pending a response. Joshua [name]

## 2022-11-13 NOTE — Progress Notes (Signed)
PROGRESS NOTE    Joshua [name]  VHQ:[id number] DOB: [birth year] DOA: 11/05/2022 PCP: [name], MD   Brief Narrative:  66 year old male with heart failure with preserved EF, CAD status post PCI and stenting, CKD stage IV, diabetes mellitus type 2, COPD, hypertension, obesity, prior right transmetatarsal amputation presented with worsening left foot wound despite outpatient oral antibiotics.  He was found to have left foot osteomyelitis.  He was started on broad-spectrum antibiotics.  Podiatry and ID were consulted.  He underwent bone biopsy and wound VAC placement.  Wound culture grew Proteus mirabilis: Antibiotics have been switched to Augmentin by ID.  PT recommended SNF placement.  He is currently medically stable for discharge.  Assessment & Plan:   Left foot fifth toe osteomyelitis Chronic wound on left lateral aspect of the left foot History of right TMA -He was started on broad-spectrum antibiotics.  Podiatry and ID were consulted.  He underwent bone biopsy and wound VAC placement.  Wound culture grew Proteus mirabilis: Antibiotics have been switched to Augmentin by ID.  Currently also on doxycycline.  Will need doxycycline and Augmentin for 4 weeks as per ID recommendations with outpatient follow-up with ID on 11/18/2022.  ID has cleared the patient for discharge. -Podiatry signed off on 11/08/2022.  Outpatient follow-up with podiatry 1 week after discharge -ABI on 11/01/2022 were normal - PT recommended SNF placement.  It looks like insurance has denied SNF placement.  Social worker following on safe discharge plan.  CAD status post multiple PCI's Chronic diastolic heart failure Hyperlipidemia -Last echo in 08/2021  revealed grade 2 diastolic dysfunction, moderate dilatation of the left atrium and an EF of 55 to 60% -Continue Brilinta, aspirin, Lipitor, Imdur and Toprol-XL.  Strict input and output.  Daily weights.  Fluid restriction.  Outpatient follow-up with cardiology  CKD  stage IV -Creatinine at baseline.  Monitor intermittently.  Diabetes mellitus type 2 with hyperglycemia Diabetic neuropathy -Continue CBGs with SSI.  Continue Farxiga and gabapentin -A1c 7.6.  Continue linagliptin  Morbid obesity -Outpatient follow-up  Leukocytosis -Resolved  Anemia of chronic disease -From chronic kidney disease.  Hemoglobin stable.  Monitor intermittently   DVT prophylaxis: Heparin subcutaneous Code Status: Full Family Communication: None at bedside Disposition Plan: Status is: Inpatient Remains inpatient appropriate because: Of severity of illness.  Social work working on [employer], broadcasting/film/video.  Currently medically stable for discharge.  Consultants: Podiatry/ID  Procedures: As above  Antimicrobials:  Anti-infectives (From admission, onward)    Start     Dose/Rate Route Frequency Ordered Stop   11/09/22 1400  ceFAZolin (ANCEF) IVPB 1 g/50 mL premix  Status:  Discontinued        1 g 100 mL/hr over 30 Minutes Intravenous Every 8 hours 11/09/22 1050 11/09/22 1056   11/09/22 1145  doxycycline (VIBRA-TABS) tablet 100 mg        100 mg Oral Every 12 hours 11/09/22 1056     11/09/22 1145  amoxicillin-clavulanate (AUGMENTIN) 875-125 MG per tablet 1 tablet        1 tablet Oral Every 12 hours 11/09/22 1056     11/06/22 2300  vancomycin (VANCOCIN) IVPB 1000 mg/200 mL premix  Status:  Discontinued        1,000 mg 200 mL/hr over 60 Minutes Intravenous Every 24 hours 11/05/22 2157 11/05/22 2352   11/06/22 0600  piperacillin-tazobactam (ZOSYN) IVPB 3.375 g  Status:  Discontinued        3.375 g 12.5 mL/hr over 240 Minutes Intravenous Every 8 hours  11/05/22 2153 11/05/22 2352   11/05/22 2200  piperacillin-tazobactam (ZOSYN) IVPB 3.375 g        3.375 g 100 mL/hr over 30 Minutes Intravenous  Once 11/05/22 2146 11/05/22 2312   11/05/22 2200  vancomycin (VANCOCIN) 2,500 mg in sodium chloride 0.9 % 500 mL IVPB  Status:  Discontinued        2,500 mg 262.5 mL/hr over 120  Minutes Intravenous  Once 11/05/22 2148 11/05/22 2352        Subjective: Patient seen and examined at bedside.  No fever, worsening chest pain or vomiting reported.   Objective: Vitals:   11/12/22 1629 11/12/22 2110 11/13/22 0526 11/13/22 0652  BP: 132/80 (!) 156/96  (!) 143/64  Pulse: 87 89  88  Resp: $Remo'15 18  17  'vFwaU$ Temp: 97.9 F (36.6 C) 99.1 F (37.3 C)  98.8 F (37.1 C)  TempSrc: Oral Oral    SpO2: 96% 97%  100%  Weight:   122.2 kg   Height:        Intake/Output Summary (Last 24 hours) at 11/13/2022 0810 Last data filed at 11/13/2022 0450 Gross per 24 hour  Intake --  Output 1550 ml  Net -1550 ml    Filed Weights   11/11/22 0446 11/12/22 0547 11/13/22 0526  Weight: 125.3 kg 126.2 kg 122.2 kg    Examination:  General exam: On room air.  No acute distress. Respiratory system: Decreased breath sounds at bases bilaterally with some crackles  cardiovascular system: Rate controlled currently; S1 and S2 are heard gastrointestinal system: Abdomen is morbidly obese, still distended slightly; soft and nontender.  Bowel sounds are heard normally extremities: Trace lower extremity edema present.  Wound VAC present on left foot.  Right TMA present.   Data Reviewed: I have personally reviewed following labs and imaging studies  CBC: No results for input(s): "WBC", "NEUTROABS", "HGB", "HCT", "MCV", "PLT" in the last 168 hours.  Basic Metabolic Panel: Recent Labs  Lab 11/07/22 0430 11/08/22 0428 11/10/22 0607  NA 139 136 135  K 3.8 3.7 3.8  CL 104 102 103  CO2 $Re'23 24 23  'CtY$ GLUCOSE 143* 166* 177*  BUN 32* 36* 50*  CREATININE 2.14* 2.68* 2.01*  CALCIUM 9.4 8.8* 8.7*    GFR: Estimated Creatinine Clearance: 47.9 mL/min (A) (by C-G formula based on SCr of 2.01 mg/dL (H)). Liver Function Tests: No results for input(s): "AST", "ALT", "ALKPHOS", "BILITOT", "PROT", "ALBUMIN" in the last 168 hours. No results for input(s): "LIPASE", "AMYLASE" in the last 168 hours. No results  for input(s): "AMMONIA" in the last 168 hours. Coagulation Profile: No results for input(s): "INR", "PROTIME" in the last 168 hours. Cardiac Enzymes: No results for input(s): "CKTOTAL", "CKMB", "CKMBINDEX", "TROPONINI" in the last 168 hours. BNP (last 3 results) No results for input(s): "PROBNP" in the last 8760 hours. HbA1C: No results for input(s): "HGBA1C" in the last 72 hours. CBG: Recent Labs  Lab 11/12/22 0839 11/12/22 1226 11/12/22 1643 11/12/22 2048 11/13/22 0804  GLUCAP 181* 185* 213* 247* 166*    Lipid Profile: No results for input(s): "CHOL", "HDL", "LDLCALC", "TRIG", "CHOLHDL", "LDLDIRECT" in the last 72 hours. Thyroid Function Tests: No results for input(s): "TSH", "T4TOTAL", "FREET4", "T3FREE", "THYROIDAB" in the last 72 hours. Anemia Panel: No results for input(s): "VITAMINB12", "FOLATE", "FERRITIN", "TIBC", "IRON", "RETICCTPCT" in the last 72 hours. Sepsis Labs: No results for input(s): "PROCALCITON", "LATICACIDVEN" in the last 168 hours.  Recent Results (from the past 240 hour(s))  Surgical PCR screen  Status: None   Collection Time: 11/06/22  5:42 PM   Specimen: Nasal Mucosa; Nasal Swab  Result Value Ref Range Status   MRSA, PCR NEGATIVE NEGATIVE Final   Staphylococcus aureus NEGATIVE NEGATIVE Final    Comment: (NOTE) The Xpert SA Assay (FDA approved for NASAL specimens in patients 86 years of age and older), is one component of a comprehensive surveillance program. It is not intended to diagnose infection nor to guide or monitor treatment. Performed at [facility], [place] [address] [place], [place] [address]   Aerobic/Anaerobic Culture w Gram Stain (surgical/deep wound)     Status: None   Collection Time: 11/07/22 12:23 PM   Specimen: Wound  Result Value Ref Range Status   Specimen Description BONE  Final   Special Requests LEFT FOOT  Final   Gram Stain NO ORGANISMS SEEN NO WBC SEEN   Final   Culture   Final    RARE PROTEUS  MIRABILIS NO ANAEROBES ISOLATED Performed at [facility], [place] [address], [place], [place] [address]    Report Status 11/12/2022 FINAL  Final   Organism ID, Bacteria PROTEUS MIRABILIS  Final      Susceptibility   Proteus mirabilis - MIC*    AMPICILLIN >=32 RESISTANT Resistant     CEFAZOLIN 8 SENSITIVE Sensitive     CEFEPIME <=0.12 SENSITIVE Sensitive     CEFTAZIDIME <=1 SENSITIVE Sensitive     CEFTRIAXONE <=0.25 SENSITIVE Sensitive     CIPROFLOXACIN <=0.25 SENSITIVE Sensitive     GENTAMICIN <=1 SENSITIVE Sensitive     IMIPENEM 2 SENSITIVE Sensitive     TRIMETH/SULFA >=320 RESISTANT Resistant     AMPICILLIN/SULBACTAM 8 SENSITIVE Sensitive     PIP/TAZO <=4 SENSITIVE Sensitive     * RARE PROTEUS MIRABILIS  Aerobic/Anaerobic Culture w Gram Stain (surgical/deep wound)     Status: None   Collection Time: 11/07/22 12:24 PM   Specimen: PATH Bone resection; Tissue  Result Value Ref Range Status   Specimen Description WOUND  Final   Special Requests LEFT FOOT DEEP  Final   Gram Stain   Final    NO ORGANISMS SEEN NO WBC SEEN Performed at [facility], [address], [place],  [address]    Culture   Final    FEW PROTEUS MIRABILIS SUSCEPTIBILITIES PERFORMED ON PREVIOUS CULTURE WITHIN THE LAST 5 DAYS. MIXED ANAEROBIC FLORA PRESENT.  CALL LAB IF FURTHER IID REQUIRED.    Report Status 11/12/2022 FINAL  Final         Radiology Studies: No results found.      Scheduled Meds:  amoxicillin-clavulanate  1 tablet Oral Q12H   ascorbic acid  500 mg Oral BID   aspirin EC  81 mg Oral Daily   atorvastatin  80 mg Oral Daily   dapagliflozin propanediol  10 mg Oral Daily   doxycycline  100 mg Oral Q12H   feeding supplement (GLUCERNA SHAKE)  237 mL Oral TID BM   gabapentin  100 mg Oral QHS   heparin  5,000 Units Subcutaneous Q8H   insulin aspart  0-20 Units Subcutaneous TID WC   insulin aspart  0-5 Units Subcutaneous QHS   insulin aspart  3 Units Subcutaneous TID  WC   isosorbide mononitrate  180 mg Oral Daily   latanoprost  1 drop Both Eyes QHS   linagliptin  5 mg Oral Daily   metoprolol succinate  75 mg Oral Daily   multivitamin with minerals  1 tablet  Oral Daily   nutrition supplement (JUVEN)  1 packet Oral BID BM   pantoprazole  40 mg Oral BID   senna-docusate  1 tablet Oral BID   ticagrelor  90 mg Oral BID   Continuous Infusions:        Aline August, MD [facility] 11/13/2022, 8:10 AM

## 2022-11-14 DIAGNOSIS — I5032 Chronic diastolic (congestive) heart failure: Secondary | ICD-10-CM | POA: Diagnosis not present

## 2022-11-14 DIAGNOSIS — M869 Osteomyelitis, unspecified: Secondary | ICD-10-CM | POA: Diagnosis not present

## 2022-11-14 LAB — GLUCOSE, CAPILLARY
Glucose-Capillary: 183 mg/dL — ABNORMAL HIGH (ref 70–99)
Glucose-Capillary: 205 mg/dL — ABNORMAL HIGH (ref 70–99)
Glucose-Capillary: 178 mg/dL — ABNORMAL HIGH (ref 70–99)
Glucose-Capillary: 155 mg/dL — ABNORMAL HIGH (ref 70–99)

## 2022-11-14 NOTE — Progress Notes (Signed)
PROGRESS NOTE    Joshua [name]  GBT:[id number] DOB: [birth year] DOA: 11/05/2022 PCP: [name], MD   Brief Narrative:  66 year old male with heart failure with preserved EF, CAD status post PCI and stenting, CKD stage IV, diabetes mellitus type 2, COPD, hypertension, obesity, prior right transmetatarsal amputation presented with worsening left foot wound despite outpatient oral antibiotics.  He was found to have left foot osteomyelitis.  He was started on broad-spectrum antibiotics.  Podiatry and ID were consulted.  He underwent bone biopsy and wound VAC placement.  Wound culture grew Proteus mirabilis: Antibiotics have been switched to Augmentin by ID.  PT recommended SNF placement.  He is currently medically stable for discharge.  Insurance denied SNF placement.  TOC working on safe discharge plan.  Assessment & Plan:   Left foot fifth toe osteomyelitis Chronic wound on left lateral aspect of the left foot History of right TMA -He was started on broad-spectrum antibiotics.  Podiatry and ID were consulted.  He underwent bone biopsy and wound VAC placement.  Wound culture grew Proteus mirabilis: Antibiotics have been switched to Augmentin by ID.  Currently also on doxycycline.  Will need doxycycline and Augmentin for 4 weeks as per ID recommendations with outpatient follow-up with ID on 11/18/2022.  ID has cleared the patient for discharge. -Podiatry signed off on 11/08/2022.  Outpatient follow-up with podiatry 1 week after discharge -ABI on 11/01/2022 were normal - PT recommended SNF placement.  It looks like insurance has denied SNF placement.  Social worker following on safe discharge plan.  CAD status post multiple PCI's Chronic diastolic heart failure Hyperlipidemia -Last echo in 08/2021  revealed grade 2 diastolic dysfunction, moderate dilatation of the left atrium and an EF of 55 to 60% -Continue Brilinta, aspirin, Lipitor, Imdur and Toprol-XL.  Strict input and output.  Daily  weights.  Fluid restriction.  Outpatient follow-up with cardiology  CKD stage IV -Creatinine at baseline.  Monitor intermittently.  Diabetes mellitus type 2 with hyperglycemia Diabetic neuropathy -Continue CBGs with SSI.  Continue Farxiga and gabapentin -A1c 7.6.  Continue linagliptin  Morbid obesity -Outpatient follow-up  Leukocytosis -Resolved  Anemia of chronic disease -From chronic kidney disease.  Hemoglobin stable.  Monitor intermittently   DVT prophylaxis: Heparin subcutaneous Code Status: Full Family Communication: None at bedside Disposition Plan: Status is: Inpatient Remains inpatient appropriate because: Of severity of illness.  Social work working on [employer], broadcasting/film/video.  Currently medically stable for discharge.  Consultants: Podiatry/ID  Procedures: As above  Antimicrobials:  Anti-infectives (From admission, onward)    Start     Dose/Rate Route Frequency Ordered Stop   11/09/22 1400  ceFAZolin (ANCEF) IVPB 1 g/50 mL premix  Status:  Discontinued        1 g 100 mL/hr over 30 Minutes Intravenous Every 8 hours 11/09/22 1050 11/09/22 1056   11/09/22 1145  doxycycline (VIBRA-TABS) tablet 100 mg        100 mg Oral Every 12 hours 11/09/22 1056     11/09/22 1145  amoxicillin-clavulanate (AUGMENTIN) 875-125 MG per tablet 1 tablet        1 tablet Oral Every 12 hours 11/09/22 1056     11/06/22 2300  vancomycin (VANCOCIN) IVPB 1000 mg/200 mL premix  Status:  Discontinued        1,000 mg 200 mL/hr over 60 Minutes Intravenous Every 24 hours 11/05/22 2157 11/05/22 2352   11/06/22 0600  piperacillin-tazobactam (ZOSYN) IVPB 3.375 g  Status:  Discontinued  3.375 g 12.5 mL/hr over 240 Minutes Intravenous Every 8 hours 11/05/22 2153 11/05/22 2352   11/05/22 2200  piperacillin-tazobactam (ZOSYN) IVPB 3.375 g        3.375 g 100 mL/hr over 30 Minutes Intravenous  Once 11/05/22 2146 11/05/22 2312   11/05/22 2200  vancomycin (VANCOCIN) 2,500 mg in sodium chloride 0.9 %  500 mL IVPB  Status:  Discontinued        2,500 mg 262.5 mL/hr over 120 Minutes Intravenous  Once 11/05/22 2148 11/05/22 2352        Subjective: Patient seen and examined at bedside.  Denies worsening shortness of breath, fever, vomiting or chest pain.   Objective: Vitals:   11/13/22 0828 11/13/22 1707 11/13/22 2010 11/14/22 0350  BP: 132/75 (!) 148/76 125/84 (!) 149/90  Pulse: 88 84 88 80  Resp: $Remo'15 16  19  'UuxDY$ Temp: 98.3 F (36.8 C) 97.8 F (36.6 C) 98.6 F (37 C) 98.3 F (36.8 C)  TempSrc: Oral Oral Oral Tympanic  SpO2: 98% 97% 100% 93%  Weight:      Height:        Intake/Output Summary (Last 24 hours) at 11/14/2022 0800 Last data filed at 11/14/2022 0400 Gross per 24 hour  Intake 340 ml  Output 1725 ml  Net -1385 ml    Filed Weights   11/11/22 0446 11/12/22 0547 11/13/22 0526  Weight: 125.3 kg 126.2 kg 122.2 kg    Examination:  General exam: Currently in no distress.  On room air currently.   Respiratory system: Bilateral decreased breath sounds at bases with some scattered crackles  cardiovascular system: S1-S2 heard; currently rate controlled gastrointestinal system: Abdomen is morbidly obese, has mild distention; soft and nontender.  Normal bowel sounds are heard  extremities: Mild lower extremity edema present.  Wound VAC present on left foot.  Right TMA present.   Data Reviewed: I have personally reviewed following labs and imaging studies  CBC: No results for input(s): "WBC", "NEUTROABS", "HGB", "HCT", "MCV", "PLT" in the last 168 hours.  Basic Metabolic Panel: Recent Labs  Lab 11/08/22 0428 11/10/22 0607  NA 136 135  K 3.7 3.8  CL 102 103  CO2 24 23  GLUCOSE 166* 177*  BUN 36* 50*  CREATININE 2.68* 2.01*  CALCIUM 8.8* 8.7*    GFR: Estimated Creatinine Clearance: 47.9 mL/min (A) (by C-G formula based on SCr of 2.01 mg/dL (H)). Liver Function Tests: No results for input(s): "AST", "ALT", "ALKPHOS", "BILITOT", "PROT", "ALBUMIN" in the last 168  hours. No results for input(s): "LIPASE", "AMYLASE" in the last 168 hours. No results for input(s): "AMMONIA" in the last 168 hours. Coagulation Profile: No results for input(s): "INR", "PROTIME" in the last 168 hours. Cardiac Enzymes: No results for input(s): "CKTOTAL", "CKMB", "CKMBINDEX", "TROPONINI" in the last 168 hours. BNP (last 3 results) No results for input(s): "PROBNP" in the last 8760 hours. HbA1C: No results for input(s): "HGBA1C" in the last 72 hours. CBG: Recent Labs  Lab 11/12/22 2048 11/13/22 0804 11/13/22 1206 11/13/22 1646 11/13/22 2008  GLUCAP 247* 166* 196* 182* 153*    Lipid Profile: No results for input(s): "CHOL", "HDL", "LDLCALC", "TRIG", "CHOLHDL", "LDLDIRECT" in the last 72 hours. Thyroid Function Tests: No results for input(s): "TSH", "T4TOTAL", "FREET4", "T3FREE", "THYROIDAB" in the last 72 hours. Anemia Panel: No results for input(s): "VITAMINB12", "FOLATE", "FERRITIN", "TIBC", "IRON", "RETICCTPCT" in the last 72 hours. Sepsis Labs: No results for input(s): "PROCALCITON", "LATICACIDVEN" in the last 168 hours.  Recent Results (from the past  240 hour(s))  Surgical PCR screen     Status: None   Collection Time: 11/06/22  5:42 PM   Specimen: Nasal Mucosa; Nasal Swab  Result Value Ref Range Status   MRSA, PCR NEGATIVE NEGATIVE Final   Staphylococcus aureus NEGATIVE NEGATIVE Final    Comment: (NOTE) The Xpert SA Assay (FDA approved for NASAL specimens in patients 75 years of age and older), is one component of a comprehensive surveillance program. It is not intended to diagnose infection nor to guide or monitor treatment. Performed at [facility], [place] [address] [place], [place] [address]   Aerobic/Anaerobic Culture w Gram Stain (surgical/deep wound)     Status: None   Collection Time: 11/07/22 12:23 PM   Specimen: Wound  Result Value Ref Range Status   Specimen Description BONE  Final   Special Requests LEFT FOOT  Final   Gram Stain  NO ORGANISMS SEEN NO WBC SEEN   Final   Culture   Final    RARE PROTEUS MIRABILIS NO ANAEROBES ISOLATED Performed at [facility], [place] [address] [place], [place] [address]    Report Status 11/12/2022 FINAL  Final   Organism ID, Bacteria PROTEUS MIRABILIS  Final      Susceptibility   Proteus mirabilis - MIC*    AMPICILLIN >=32 RESISTANT Resistant     CEFAZOLIN 8 SENSITIVE Sensitive     CEFEPIME <=0.12 SENSITIVE Sensitive     CEFTAZIDIME <=1 SENSITIVE Sensitive     CEFTRIAXONE <=0.25 SENSITIVE Sensitive     CIPROFLOXACIN <=0.25 SENSITIVE Sensitive     GENTAMICIN <=1 SENSITIVE Sensitive     IMIPENEM 2 SENSITIVE Sensitive     TRIMETH/SULFA >=320 RESISTANT Resistant     AMPICILLIN/SULBACTAM 8 SENSITIVE Sensitive     PIP/TAZO <=4 SENSITIVE Sensitive     * RARE PROTEUS MIRABILIS  Aerobic/Anaerobic Culture w Gram Stain (surgical/deep wound)     Status: None   Collection Time: 11/07/22 12:24 PM   Specimen: PATH Bone resection; Tissue  Result Value Ref Range Status   Specimen Description WOUND  Final   Special Requests LEFT FOOT DEEP  Final   Gram Stain   Final    NO ORGANISMS SEEN NO WBC SEEN Performed at [facility], [address], [place], [place] [address]    Culture   Final    FEW PROTEUS MIRABILIS SUSCEPTIBILITIES PERFORMED ON PREVIOUS CULTURE WITHIN THE LAST 5 DAYS. MIXED ANAEROBIC FLORA PRESENT.  CALL LAB IF FURTHER IID REQUIRED.    Report Status 11/12/2022 FINAL  Final         Radiology Studies: No results found.      Scheduled Meds:  allopurinol  100 mg Oral Daily   amoxicillin-clavulanate  1 tablet Oral Q12H   ascorbic acid  500 mg Oral BID   aspirin EC  81 mg Oral Daily   atorvastatin  80 mg Oral Daily   dapagliflozin propanediol  10 mg Oral Daily   doxycycline  100 mg Oral Q12H   feeding supplement (GLUCERNA SHAKE)  237 mL Oral TID BM   gabapentin  100 mg Oral QHS   heparin  5,000 Units Subcutaneous Q8H   insulin aspart  0-20 Units  Subcutaneous TID WC   insulin aspart  0-5 Units Subcutaneous QHS   insulin aspart  3 Units Subcutaneous TID WC   isosorbide mononitrate  180 mg Oral Daily   latanoprost  1 drop Both Eyes QHS   linagliptin  5 mg Oral  Daily   metoprolol succinate  75 mg Oral Daily   multivitamin with minerals  1 tablet Oral Daily   nutrition supplement (JUVEN)  1 packet Oral BID BM   pantoprazole  40 mg Oral BID   senna-docusate  1 tablet Oral BID   ticagrelor  90 mg Oral BID   Continuous Infusions:        Aline August, MD [facility] 11/14/2022, 8:00 AM

## 2022-11-14 NOTE — Plan of Care (Signed)
Problem: Education: Goal: Ability to describe self-care measures that may prevent or decrease complications (Diabetes Survival Skills Education) will improve 11/14/2022 1808 by Andrey Campanile, RN Outcome: Progressing 11/14/2022 1534 by Andrey Campanile, RN Outcome: Progressing Goal: Individualized Educational Video(s) 11/14/2022 1808 by Andrey Campanile, RN Outcome: Progressing 11/14/2022 1534 by Andrey Campanile, RN Outcome: Progressing   Problem: Coping: Goal: Ability to adjust to condition or change in health will improve 11/14/2022 1808 by Andrey Campanile, RN Outcome: Progressing 11/14/2022 1534 by Andrey Campanile, RN Outcome: Progressing   Problem: Fluid Volume: Goal: Ability to maintain a balanced intake and output will improve 11/14/2022 1808 by Andrey Campanile, RN Outcome: Progressing 11/14/2022 1534 by Andrey Campanile, RN Outcome: Progressing   Problem: Health Behavior/Discharge Planning: Goal: Ability to identify and utilize available resources and services will improve 11/14/2022 1808 by Andrey Campanile, RN Outcome: Progressing 11/14/2022 1534 by Andrey Campanile, RN Outcome: Progressing Goal: Ability to manage health-related needs will improve 11/14/2022 1808 by Andrey Campanile, RN Outcome: Progressing 11/14/2022 1534 by Andrey Campanile, RN Outcome: Progressing   Problem: Metabolic: Goal: Ability to maintain appropriate glucose levels will improve 11/14/2022 1808 by Andrey Campanile, RN Outcome: Progressing 11/14/2022 1534 by Andrey Campanile, RN Outcome: Progressing   Problem: Nutritional: Goal: Maintenance of adequate nutrition will improve 11/14/2022 1808 by Andrey Campanile, RN Outcome: Progressing 11/14/2022 1534 by Andrey Campanile, RN Outcome: Progressing Goal: Progress toward achieving an optimal weight will improve 11/14/2022 1808 by Andrey Campanile, RN Outcome: Progressing 11/14/2022 1534 by Andrey Campanile, RN Outcome:  Progressing   Problem: Skin Integrity: Goal: Risk for impaired skin integrity will decrease 11/14/2022 1808 by Andrey Campanile, RN Outcome: Progressing 11/14/2022 1534 by Andrey Campanile, RN Outcome: Progressing   Problem: Tissue Perfusion: Goal: Adequacy of tissue perfusion will improve 11/14/2022 1808 by Andrey Campanile, RN Outcome: Progressing 11/14/2022 1534 by Andrey Campanile, RN Outcome: Progressing   Problem: Education: Goal: Knowledge of General Education information will improve Description: Including pain rating scale, medication(s)/side effects and non-pharmacologic comfort measures 11/14/2022 1808 by Andrey Campanile, RN Outcome: Progressing 11/14/2022 1534 by Andrey Campanile, RN Outcome: Progressing   Problem: Health Behavior/Discharge Planning: Goal: Ability to manage health-related needs will improve 11/14/2022 1808 by Andrey Campanile, RN Outcome: Progressing 11/14/2022 1534 by Andrey Campanile, RN Outcome: Progressing   Problem: Clinical Measurements: Goal: Ability to maintain clinical measurements within normal limits will improve 11/14/2022 1808 by Andrey Campanile, RN Outcome: Progressing 11/14/2022 1534 by Andrey Campanile, RN Outcome: Progressing Goal: Will remain free from infection 11/14/2022 1808 by Andrey Campanile, RN Outcome: Progressing 11/14/2022 1534 by Andrey Campanile, RN Outcome: Progressing Goal: Diagnostic test results will improve 11/14/2022 1808 by Andrey Campanile, RN Outcome: Progressing 11/14/2022 1534 by Andrey Campanile, RN Outcome: Progressing Goal: Respiratory complications will improve 11/14/2022 1808 by Andrey Campanile, RN Outcome: Progressing 11/14/2022 1534 by Andrey Campanile, RN Outcome: Progressing Goal: Cardiovascular complication will be avoided 11/14/2022 1808 by Andrey Campanile, RN Outcome: Progressing 11/14/2022 1534 by Andrey Campanile, RN Outcome: Progressing   Problem: Activity: Goal: Risk  for activity intolerance will decrease 11/14/2022 1808 by Andrey Campanile, RN Outcome: Progressing 11/14/2022 1534 by Andrey Campanile, RN Outcome: Progressing   Problem: Nutrition: Goal: Adequate nutrition will be maintained 11/14/2022 1808 by Andrey Campanile, RN Outcome: Progressing 11/14/2022 1534 by Andrey Campanile, RN Outcome: Progressing   Problem: Coping: Goal: Level of anxiety will decrease 11/14/2022 1808 by Andrey Campanile, RN Outcome: Progressing 11/14/2022 1534 by Andrey Campanile, RN Outcome: Progressing   Problem: Elimination: Goal: Will not experience complications related to bowel motility 11/14/2022 1808  by Andrey Campanile, RN Outcome: Progressing 11/14/2022 1534 by Andrey Campanile, RN Outcome: Progressing Goal: Will not experience complications related to urinary retention 11/14/2022 1808 by Andrey Campanile, RN Outcome: Progressing 11/14/2022 1534 by Andrey Campanile, RN Outcome: Progressing   Problem: Pain Managment: Goal: General experience of comfort will improve 11/14/2022 1808 by Andrey Campanile, RN Outcome: Progressing 11/14/2022 1534 by Andrey Campanile, RN Outcome: Progressing   Problem: Safety: Goal: Ability to remain free from injury will improve 11/14/2022 1808 by Andrey Campanile, RN Outcome: Progressing 11/14/2022 1534 by Andrey Campanile, RN Outcome: Progressing   Problem: Skin Integrity: Goal: Risk for impaired skin integrity will decrease 11/14/2022 1808 by Andrey Campanile, RN Outcome: Progressing 11/14/2022 1534 by Andrey Campanile, RN Outcome: Progressing

## 2022-11-14 NOTE — Progress Notes (Signed)
Mobility Specialist Progress Note:   11/14/22 0920  Mobility  Activity Ambulated with assistance in hallway;Ambulated independently in hallway  Level of Assistance Modified independent, requires aide device or extra time  Assistive Device [employer] Ambulated (ft) 400 ft  Activity Response Tolerated well  Mobility Referral Yes  $Mobility charge 1 Mobility   Pt agreeable to mobility session this am. Required no physical assistance throughout. Pt asx with ambulation, left sitting EOB with all needs met.   Nelta Numbers Mobility Specialist Please contact via SecureChat or  Rehab office at [phone]

## 2022-11-14 NOTE — Plan of Care (Signed)

## 2022-11-15 ENCOUNTER — Ambulatory Visit: Payer: Medicare Other | Admitting: Podiatry

## 2022-11-15 ENCOUNTER — Telehealth: Payer: Self-pay | Admitting: Pediatric Intensive Care

## 2022-11-15 DIAGNOSIS — M869 Osteomyelitis, unspecified: Secondary | ICD-10-CM | POA: Diagnosis not present

## 2022-11-15 LAB — GLUCOSE, CAPILLARY
Glucose-Capillary: 165 mg/dL — ABNORMAL HIGH (ref 70–99)
Glucose-Capillary: 209 mg/dL — ABNORMAL HIGH (ref 70–99)
Glucose-Capillary: 138 mg/dL — ABNORMAL HIGH (ref 70–99)
Glucose-Capillary: 156 mg/dL — ABNORMAL HIGH (ref 70–99)

## 2022-11-15 MED ORDER — ENSURE MAX PROTEIN PO LIQD
11.0000 [oz_av] | Freq: Two times a day (BID) | ORAL | Status: DC
  Administered 2022-11-15: 11 [oz_av] via ORAL
  Filled 2022-11-15 (×4): qty 330

## 2022-11-15 NOTE — Progress Notes (Signed)
Mobility Specialist Progress Note:   11/15/22 1204  Mobility  Activity Ambulated independently in hallway  Level of Assistance Modified independent, requires aide device or extra time  Assistive Device Cane  Distance Ambulated (ft) 400 ft  LLE Weight Bearing WBAT  Activity Response Tolerated well  Mobility Referral Yes  $Mobility charge 1 Mobility   Pt received in chair and agreeable. No complaints. Pt left in chair with all needs met and call bell in reach.   Andrey Campanile Mobility Specialist Please contact via SecureChat or  Rehab office at [phone]

## 2022-11-15 NOTE — Plan of Care (Signed)

## 2022-11-15 NOTE — Progress Notes (Signed)
Patient using hospital CPAP independently. Made sure unit was plugged into red outlet and on correct settings. Told patient to call if he needed help with anything.

## 2022-11-15 NOTE — Progress Notes (Signed)
Nutrition Follow-up  DOCUMENTATION CODES:  Severe malnutrition in context of social or environmental circumstances  INTERVENTION:  Liberalize diet to carb modified with 2L fluid restriction Adjust Glucerna to Ensure Max po BID, each supplement provides 150 kcal and 30 grams of protein.  Continue MVI with minerals Continue 1 packet Juven BID, each packet provides 95 calories, 2.5 grams of protein (collagen), and 9.8 grams of carbohydrate (3 grams sugar); also contains 7 grams of L-arginine and L-glutamine, 300 mg vitamin C, 15 mg vitamin E, 1.2 mcg vitamin B-12, 9.5 mg zinc, 200 mg calcium, and 1.5 g  Calcium Beta-hydroxy-Beta-methylbutyrate to support wound healing  NUTRITION DIAGNOSIS:  Severe Malnutrition related to social / environmental circumstances as evidenced by energy intake < or equal to 50% for > or equal to 1 month, percent weight loss. - remains applicable  GOAL:  Patient will meet greater than or equal to 90% of their needs - progressing  MONITOR:  PO intake, Supplement acceptance  REASON FOR ASSESSMENT:  Malnutrition Screening Tool    ASSESSMENT:  66 y.o. male admits related to left foot wound. PMH includes: HFpEF, CAD, CKD stage IV, COPD, T2DM, HTN, HLD, OSA on CPAP.  11/26 - Op, wound debridement, wound vac placed  Pt resting in bedside chair at the time of assessment. Pt reports that he is getting his meals consistently, but that he is not always eating them. Reports that his food is often flavorless due to his diet restrictions. States that he has been following a low sodium diet for a long time but that he does like to sprinkle "a pinch" on his vegetables. Pt also reports that he has been trying to purposefully restrict his intake while admitted to encourage weight loss.   Discussed with pt the importance of him taking in adequate protein and kcal while admitted for wound healing. Noted a boost breeze on tray. Pt reports that he was given boost breeze because the  floor was out of glucerna. Adjusted to Ensure Max to come from pharmacy and provide additional protein. Pt reports the milk-based supplements.   No other concerns at this time. Of note, performed physical exam and pt has fluid present to the BLE. Did note severe muscle wasting to the hands.   Average Meal Intake: 11/28-12/03: 100% intake x 8 recorded meals  Nutritionally Relevant Medications: Scheduled Meds:  amoxicillin-clavulanate  1 tablet Oral Q12H   ascorbic acid  500 mg Oral BID   atorvastatin  80 mg Oral Daily   doxycycline  100 mg Oral Q12H   GLUCERNA SHAKE  237 mL Oral TID BM   insulin aspart  0-20 Units Subcutaneous TID WC   insulin aspart  0-5 Units Subcutaneous QHS   insulin aspart  3 Units Subcutaneous TID WC   multivitamin with minerals  1 tablet Oral Daily   nutrition supplement (JUVEN)  1 packet Oral BID BM   pantoprazole  40 mg Oral BID   senna-docusate  1 tablet Oral BID   Labs Reviewed: CBG ranges from 153-205 mg/dL over the last 24 hours HgbA1c 7.6% (9/14)  NUTRITION - FOCUSED PHYSICAL EXAM: Flowsheet Row Most Recent Value  Orbital Region No depletion  Upper Arm Region No depletion  Thoracic and Lumbar Region No depletion  Buccal Region No depletion  Temple Region No depletion  Clavicle Bone Region No depletion  Clavicle and Acromion Bone Region No depletion  Scapular Bone Region No depletion  Dorsal Hand Severe depletion  Patellar Region No depletion  Anterior Thigh  Region No depletion  Posterior Calf Region No depletion  Edema (RD Assessment) Moderate  [BLE]  Hair Reviewed  Eyes Reviewed  Mouth Reviewed  Skin Reviewed  Nails Reviewed   Diet Order:   Diet Order             Diet Carb Modified Fluid consistency: Thin; Room service appropriate? Yes with Assist; Fluid restriction: 2000 mL Fluid  Diet effective now                   EDUCATION NEEDS:  Education needs have been addressed  Skin: Other: Left foot wound, wound vac in  place  Last BM:  12/2  Height:  Ht Readings from Last 1 Encounters:  11/07/22 5' 9.02" (1.753 m)    Weight:  Wt Readings from Last 1 Encounters:  11/13/22 122.2 kg    Ideal Body Weight:  72.7 kg  BMI:  Body mass index is 39.75 kg/m.  Estimated Nutritional Needs:  Kcal:  2100-2300 kcal/d Protein:  110-135 gm Fluid:  >/=2.2L/d    Ranell Patrick, RD, LDN Clinical Dietitian RD pager # available in [facility]  After hours/weekend pager # available in [facility]

## 2022-11-15 NOTE — Telephone Encounter (Signed)
Received email from [place] and [place] regarding this client and need for housing on discharge. Client can be eligible for patient assistance for temporary hotel stay. TOC to coordinate hotel stay on discharge. Lisette Abu BSN RN CCNP [phone]

## 2022-11-15 NOTE — TOC Progression Note (Addendum)
Transition of Care [facility]) - Progression Note    Patient Details  Name: Joshua [name] MRN: [id number] Date of Birth: 1956/09/10  Transition of Care [facility]) CM/SW [place], RN Phone Number: 11/15/2022, 9:40 AM  Clinical Narrative:    CM spoke with Dr. Loel Lofty, Podiatrist this morning and he was unable to receive [facility] wound vac prescription by email.  I sent a message to Leontine Locket, [employer] wound vac to resend prescription to Dr. Loel Lofty or myself to have physician sign in order to complete insurance authorization for home wound vac needs.  I called and spoke with Barbette Or, MSW [facility] Supervisor and she is following up with [employer] to check on available for funds for [employer] - otherwise patient plans to discharge home to son's apartment.  [facility] continues to follow the patient for wound care involving wound vac 3 x per week.  11/15/2022 1045 - Dr. Loel Lofty completed e-script for pending insur auth for wound vac  11/15/2022 1108 - I spoke with Margreta Journey, Congregational RN and she will call the patient to have him speak with [employer] for possible availability to hotel accommodations - pending at this time.  The patient's son was present in the room during this conversation and he confirmed that the patient has availability to stay on his couch at the apartment in [place] area if hotel was not an option.    11/15/2022 CM called and spoke with Barbette Or and hospital leadership to assistance with hotel accommodations.  I called and spoke with Lisette Abu, congregational nursing and patient assistance form signed by the patient and forwarded to the [employer] address as requested by [place], [facility].  11/15/2022 1447  Home wound vac was delivered to the patient's hospital room and bedside nursing to switch the patient over to home wound vac tomorrow when he discharges to hotel.  The patient states that his son will  be available to assist with transportation to the hotel.  The patient's son is able to drive and provide transportation.  CM will continue to follow the patient for discharge needs to transfer to son's home versus extended stay Hotel.   Expected Discharge Plan: [place] Barriers to Discharge: Continued Medical Work up, Homeless with medical needs  Expected Discharge Plan and Services Expected Discharge Plan: [place]   Discharge Planning Services: CM Consult   Living arrangements for the past 2 months: Homeless (currently staying with a roommate in community housing room with no running water)                                       Social Determinants of Health (SDOH) Interventions    Readmission Risk Interventions    11/08/2022    2:21 PM  Readmission Risk Prevention Plan  Transportation Screening Complete  PCP or Specialist Appt within 5-7 Days Complete  Home Care Screening Complete  Medication Review (RN CM) Complete

## 2022-11-15 NOTE — Consult Note (Signed)
[place] Nurse wound follow up Wound type: surgical with Integra graft per podiatry; left lateral foot  Measurement: 2cm x 2cm with graft stapled in place  Wound bed: covered with graft material  Drainage (amount, consistency, odor) STRONG odor, related to graft use and autonomic foot changes.  Periwound:dorsal foot is slightly macerated despite use of drape to protect.  Decided today to leave this skin open and bridge to the malleolar region. Explained rationale for this to the patient Dressing procedure/placement/frequency: Removed old NPWT dressing Cleansed wound and foot with saline, dried. Periwound skin protected with skin barrier wipe or window framed with drape Skin protected to malleolar region with VAC drape for foam bridge, dorsal foot is slightly macerated. Decided to give this skin a break for a short bit Covered wound with `1__ piece of black foam; using 1pc of black foam as a bridge to allow patient not to walk on TRAC pad, using bolster of ABD pad under tubing to protect leg and wrapped with ACE wrap to protect tubing from being pulled loose.  Patient tolerated procedure well  [place] nurse will continue to provide NPWT dressing changed due to the complexity of the dressing change.    [place], [place], [place]

## 2022-11-15 NOTE — Progress Notes (Signed)
PROGRESS NOTE    Joshua [name]  NLG:[id number] DOB: [birth year] DOA: 11/05/2022 PCP: [name], MD   Brief Narrative:  66 year old male with heart failure with preserved EF, CAD status post PCI and stenting, CKD stage IV, diabetes mellitus type 2, COPD, hypertension, obesity, prior right transmetatarsal amputation presented with worsening left foot wound despite outpatient oral antibiotics.  He was found to have left foot osteomyelitis.  He was started on broad-spectrum antibiotics.  Podiatry and ID were consulted.  He underwent bone biopsy and wound VAC placement.  Wound culture grew Proteus mirabilis: Antibiotics have been switched to Augmentin by ID.  PT recommended SNF placement.  He is currently medically stable for discharge.  Insurance denied SNF placement.  TOC working on safe discharge plan.  Assessment & Plan:   Left foot fifth toe osteomyelitis Chronic wound on left lateral aspect of the left foot History of right TMA -He was started on broad-spectrum antibiotics.  Podiatry and ID were consulted.  He underwent bone biopsy and wound VAC placement.  Wound culture grew Proteus mirabilis: Antibiotics have been switched to Augmentin by ID.  Currently also on doxycycline.  Will need doxycycline and Augmentin for 4 weeks as per ID recommendations with outpatient follow-up with ID on 11/18/2022.  ID has cleared the patient for discharge. -Podiatry signed off on 11/08/2022.  Outpatient follow-up with podiatry 1 week after discharge -ABI on 11/01/2022 were normal - PT recommended SNF placement.  It looks like insurance has denied SNF placement.  Social worker working on safe discharge plan.  CAD status post multiple PCI's Chronic diastolic heart failure Hyperlipidemia -Last echo in 08/2021  revealed grade 2 diastolic dysfunction, moderate dilatation of the left atrium and an EF of 55 to 60% -Continue Brilinta, aspirin, Lipitor, Imdur and Toprol-XL.  Strict input and output.  Daily  weights.  Fluid restriction.  Outpatient follow-up with cardiology  CKD stage IV -Creatinine at baseline.  Monitor intermittently.  Diabetes mellitus type 2 with hyperglycemia Diabetic neuropathy -Continue CBGs with SSI.  Continue Farxiga and gabapentin -A1c 7.6.  Continue linagliptin  Morbid obesity -Outpatient follow-up  Leukocytosis -Resolved  Anemia of chronic disease -From chronic kidney disease.  Hemoglobin stable.  Monitor intermittently   DVT prophylaxis: Heparin subcutaneous Code Status: Full Family Communication: None at bedside Disposition Plan: Status is: Inpatient Remains inpatient appropriate because: Of severity of illness.  Social worker working on safe discharge plan.  Currently medically stable for discharge.  Consultants: Podiatry/ID  Procedures: As above  Antimicrobials:  Anti-infectives (From admission, onward)    Start     Dose/Rate Route Frequency Ordered Stop   11/09/22 1400  ceFAZolin (ANCEF) IVPB 1 g/50 mL premix  Status:  Discontinued        1 g 100 mL/hr over 30 Minutes Intravenous Every 8 hours 11/09/22 1050 11/09/22 1056   11/09/22 1145  doxycycline (VIBRA-TABS) tablet 100 mg        100 mg Oral Every 12 hours 11/09/22 1056 12/07/22 0959   11/09/22 1145  amoxicillin-clavulanate (AUGMENTIN) 875-125 MG per tablet 1 tablet        1 tablet Oral Every 12 hours 11/09/22 1056 12/07/22 0959   11/06/22 2300  vancomycin (VANCOCIN) IVPB 1000 mg/200 mL premix  Status:  Discontinued        1,000 mg 200 mL/hr over 60 Minutes Intravenous Every 24 hours 11/05/22 2157 11/05/22 2352   11/06/22 0600  piperacillin-tazobactam (ZOSYN) IVPB 3.375 g  Status:  Discontinued  3.375 g 12.5 mL/hr over 240 Minutes Intravenous Every 8 hours 11/05/22 2153 11/05/22 2352   11/05/22 2200  piperacillin-tazobactam (ZOSYN) IVPB 3.375 g        3.375 g 100 mL/hr over 30 Minutes Intravenous  Once 11/05/22 2146 11/05/22 2312   11/05/22 2200  vancomycin (VANCOCIN) 2,500 mg  in sodium chloride 0.9 % 500 mL IVPB  Status:  Discontinued        2,500 mg 262.5 mL/hr over 120 Minutes Intravenous  Once 11/05/22 2148 11/05/22 2352        Subjective: Patient seen and examined at bedside.  No fever, vomiting, chest pain or worsening shortness of breath reported.   Objective: Vitals:   11/14/22 0853 11/14/22 1657 11/14/22 2021 11/15/22 0500  BP: 125/74 116/84 128/77 130/73  Pulse: 90 82 86 81  Resp: $Remo'16 16  19  'glpCW$ Temp: 98.3 F (36.8 C) 98.4 F (36.9 C) 98.6 F (37 C) 98.4 F (36.9 C)  TempSrc: Oral Oral Oral Oral  SpO2: 97% 99% 100% 99%  Weight:      Height:        Intake/Output Summary (Last 24 hours) at 11/15/2022 0720 Last data filed at 11/14/2022 2148 Gross per 24 hour  Intake 360 ml  Output 1350 ml  Net -990 ml    Filed Weights   11/11/22 0446 11/12/22 0547 11/13/22 0526  Weight: 125.3 kg 126.2 kg 122.2 kg    Examination:  General exam: No distress.  On room air currently.   Respiratory system: Decreased breath sounds at bases bilaterally with scattered crackles  cardiovascular system: Rate controlled mostly; S1 and S2 are heard gastrointestinal system: Abdomen is morbidly obese, still distended; soft and nontender.  Bowel sounds are heard normally extremities: Bilateral lower extremity edema present.  Wound VAC present on left foot.  Right TMA present.   Data Reviewed: I have personally reviewed following labs and imaging studies  CBC: No results for input(s): "WBC", "NEUTROABS", "HGB", "HCT", "MCV", "PLT" in the last 168 hours.  Basic Metabolic Panel: Recent Labs  Lab 11/10/22 0607  NA 135  K 3.8  CL 103  CO2 23  GLUCOSE 177*  BUN 50*  CREATININE 2.01*  CALCIUM 8.7*    GFR: Estimated Creatinine Clearance: 47.9 mL/min (A) (by C-G formula based on SCr of 2.01 mg/dL (H)). Liver Function Tests: No results for input(s): "AST", "ALT", "ALKPHOS", "BILITOT", "PROT", "ALBUMIN" in the last 168 hours. No results for input(s): "LIPASE",  "AMYLASE" in the last 168 hours. No results for input(s): "AMMONIA" in the last 168 hours. Coagulation Profile: No results for input(s): "INR", "PROTIME" in the last 168 hours. Cardiac Enzymes: No results for input(s): "CKTOTAL", "CKMB", "CKMBINDEX", "TROPONINI" in the last 168 hours. BNP (last 3 results) No results for input(s): "PROBNP" in the last 8760 hours. HbA1C: No results for input(s): "HGBA1C" in the last 72 hours. CBG: Recent Labs  Lab 11/13/22 2008 11/14/22 0823 11/14/22 1231 11/14/22 1629 11/14/22 2019  GLUCAP 153* 183* 205* 178* 155*    Lipid Profile: No results for input(s): "CHOL", "HDL", "LDLCALC", "TRIG", "CHOLHDL", "LDLDIRECT" in the last 72 hours. Thyroid Function Tests: No results for input(s): "TSH", "T4TOTAL", "FREET4", "T3FREE", "THYROIDAB" in the last 72 hours. Anemia Panel: No results for input(s): "VITAMINB12", "FOLATE", "FERRITIN", "TIBC", "IRON", "RETICCTPCT" in the last 72 hours. Sepsis Labs: No results for input(s): "PROCALCITON", "LATICACIDVEN" in the last 168 hours.  Recent Results (from the past 240 hour(s))  Surgical PCR screen     Status: None  Collection Time: 11/06/22  5:42 PM   Specimen: Nasal Mucosa; Nasal Swab  Result Value Ref Range Status   MRSA, PCR NEGATIVE NEGATIVE Final   Staphylococcus aureus NEGATIVE NEGATIVE Final    Comment: (NOTE) The Xpert SA Assay (FDA approved for NASAL specimens in patients 26 years of age and older), is one component of a comprehensive surveillance program. It is not intended to diagnose infection nor to guide or monitor treatment. Performed at [facility], [place] [address], [place], [place] [address]   Aerobic/Anaerobic Culture w Gram Stain (surgical/deep wound)     Status: None   Collection Time: 11/07/22 12:23 PM   Specimen: Wound  Result Value Ref Range Status   Specimen Description BONE  Final   Special Requests LEFT FOOT  Final   Gram Stain NO ORGANISMS SEEN NO WBC SEEN   Final    Culture   Final    RARE PROTEUS MIRABILIS NO ANAEROBES ISOLATED Performed at [facility], [place] [address], [place], [place] [address]    Report Status 11/12/2022 FINAL  Final   Organism ID, Bacteria PROTEUS MIRABILIS  Final      Susceptibility   Proteus mirabilis - MIC*    AMPICILLIN >=32 RESISTANT Resistant     CEFAZOLIN 8 SENSITIVE Sensitive     CEFEPIME <=0.12 SENSITIVE Sensitive     CEFTAZIDIME <=1 SENSITIVE Sensitive     CEFTRIAXONE <=0.25 SENSITIVE Sensitive     CIPROFLOXACIN <=0.25 SENSITIVE Sensitive     GENTAMICIN <=1 SENSITIVE Sensitive     IMIPENEM 2 SENSITIVE Sensitive     TRIMETH/SULFA >=320 RESISTANT Resistant     AMPICILLIN/SULBACTAM 8 SENSITIVE Sensitive     PIP/TAZO <=4 SENSITIVE Sensitive     * RARE PROTEUS MIRABILIS  Aerobic/Anaerobic Culture w Gram Stain (surgical/deep wound)     Status: None   Collection Time: 11/07/22 12:24 PM   Specimen: PATH Bone resection; Tissue  Result Value Ref Range Status   Specimen Description WOUND  Final   Special Requests LEFT FOOT DEEP  Final   Gram Stain   Final    NO ORGANISMS SEEN NO WBC SEEN Performed at [facility], [address], [place], [place] [address]    Culture   Final    FEW PROTEUS MIRABILIS SUSCEPTIBILITIES PERFORMED ON PREVIOUS CULTURE WITHIN THE LAST 5 DAYS. MIXED ANAEROBIC FLORA PRESENT.  CALL LAB IF FURTHER IID REQUIRED.    Report Status 11/12/2022 FINAL  Final         Radiology Studies: No results found.      Scheduled Meds:  allopurinol  100 mg Oral Daily   amoxicillin-clavulanate  1 tablet Oral Q12H   ascorbic acid  500 mg Oral BID   aspirin EC  81 mg Oral Daily   atorvastatin  80 mg Oral Daily   dapagliflozin propanediol  10 mg Oral Daily   doxycycline  100 mg Oral Q12H   feeding supplement (GLUCERNA SHAKE)  237 mL Oral TID BM   gabapentin  100 mg Oral QHS   heparin  5,000 Units Subcutaneous Q8H   insulin aspart  0-20 Units Subcutaneous TID WC   insulin aspart  0-5  Units Subcutaneous QHS   insulin aspart  3 Units Subcutaneous TID WC   isosorbide mononitrate  180 mg Oral Daily   latanoprost  1 drop Both Eyes QHS   linagliptin  5 mg Oral Daily   metoprolol succinate  75 mg Oral Daily   multivitamin with  minerals  1 tablet Oral Daily   nutrition supplement (JUVEN)  1 packet Oral BID BM   pantoprazole  40 mg Oral BID   senna-docusate  1 tablet Oral BID   ticagrelor  90 mg Oral BID   Continuous Infusions:        Aline August, MD [facility] 11/15/2022, 7:20 AM

## 2022-11-16 ENCOUNTER — Other Ambulatory Visit (HOSPITAL_COMMUNITY): Payer: Self-pay

## 2022-11-16 DIAGNOSIS — I5032 Chronic diastolic (congestive) heart failure: Secondary | ICD-10-CM | POA: Diagnosis not present

## 2022-11-16 DIAGNOSIS — I152 Hypertension secondary to endocrine disorders: Secondary | ICD-10-CM

## 2022-11-16 DIAGNOSIS — E11628 Type 2 diabetes mellitus with other skin complications: Secondary | ICD-10-CM | POA: Diagnosis not present

## 2022-11-16 DIAGNOSIS — E1159 Type 2 diabetes mellitus with other circulatory complications: Secondary | ICD-10-CM

## 2022-11-16 DIAGNOSIS — M869 Osteomyelitis, unspecified: Secondary | ICD-10-CM | POA: Diagnosis not present

## 2022-11-16 LAB — GLUCOSE, CAPILLARY
Glucose-Capillary: 163 mg/dL — ABNORMAL HIGH (ref 70–99)
Glucose-Capillary: 180 mg/dL — ABNORMAL HIGH (ref 70–99)

## 2022-11-16 MED ORDER — OXYCODONE-ACETAMINOPHEN 5-325 MG PO TABS
1.0000 | ORAL_TABLET | Freq: Four times a day (QID) | ORAL | 0 refills | Status: DC | PRN
  Filled 2022-11-16: qty 20, 5d supply, fill #0

## 2022-11-16 MED ORDER — ASCORBIC ACID 500 MG PO TABS
500.0000 mg | ORAL_TABLET | Freq: Two times a day (BID) | ORAL | 0 refills | Status: DC
  Filled 2022-11-16: qty 60, 30d supply, fill #0

## 2022-11-16 MED ORDER — SENNOSIDES-DOCUSATE SODIUM 8.6-50 MG PO TABS
1.0000 | ORAL_TABLET | Freq: Two times a day (BID) | ORAL | 0 refills | Status: DC
  Filled 2022-11-16: qty 60, 30d supply, fill #0

## 2022-11-16 MED ORDER — POLYETHYLENE GLYCOL 3350 17 GM/SCOOP PO POWD
17.0000 g | Freq: Every day | ORAL | 0 refills | Status: DC | PRN
  Filled 2022-11-16: qty 238, 14d supply, fill #0

## 2022-11-16 MED ORDER — LINAGLIPTIN 5 MG PO TABS
5.0000 mg | ORAL_TABLET | Freq: Every day | ORAL | 0 refills | Status: DC
  Filled 2022-11-16: qty 30, 30d supply, fill #0

## 2022-11-16 MED ORDER — DOXYCYCLINE HYCLATE 100 MG PO TABS
100.0000 mg | ORAL_TABLET | Freq: Two times a day (BID) | ORAL | 0 refills | Status: DC
  Filled 2022-11-16: qty 60, 30d supply, fill #0

## 2022-11-16 MED ORDER — AMOXICILLIN-POT CLAVULANATE 875-125 MG PO TABS
1.0000 | ORAL_TABLET | Freq: Two times a day (BID) | ORAL | 0 refills | Status: DC
  Filled 2022-11-16: qty 60, 30d supply, fill #0

## 2022-11-16 MED ORDER — FUROSEMIDE 80 MG PO TABS: 40.0000 mg | ORAL_TABLET | Freq: Every day | ORAL | Status: DC

## 2022-11-16 MED ORDER — ALLOPURINOL 100 MG PO TABS
100.0000 mg | ORAL_TABLET | Freq: Every day | ORAL | 0 refills | Status: DC
  Filled 2022-11-16: qty 30, 30d supply, fill #0

## 2022-11-16 MED ORDER — LANTUS SOLOSTAR 100 UNIT/ML ~~LOC~~ SOPN: 5.0000 [IU] | PEN_INJECTOR | Freq: Two times a day (BID) | SUBCUTANEOUS | 0 refills | Status: DC

## 2022-11-16 NOTE — Plan of Care (Signed)
  Problem: Education: Goal: Individualized Educational Video(s) Outcome: Completed/Met   Problem: Coping: Goal: Ability to adjust to condition or change in health will improve Outcome: Completed/Met   Problem: Health Behavior/Discharge Planning: Goal: Ability to identify and utilize available resources and services will improve Outcome: Completed/Met   Problem: Metabolic: Goal: Ability to maintain appropriate glucose levels will improve Outcome: Completed/Met   Problem: Skin Integrity: Goal: Risk for impaired skin integrity will decrease Outcome: Progressing   Problem: Tissue Perfusion: Goal: Adequacy of tissue perfusion will improve Outcome: Progressing

## 2022-11-16 NOTE — TOC Transition Note (Signed)
Transition of Care [facility]) - CM/SW Discharge Note   Patient Details  Name: Joshua [name] MRN: [id number] Date of Birth: 31-Jan-1956  [facility] CM/SW Contact:  Curlene Labrum, RN Phone Number: 11/16/2022, 10:19 AM   Clinical Narrative:    Cm met with the patient at the bedside and the patient will be discharged to [facility] - coordinated by hospital leadership for the next 30 days.  The patient is expected to discharge after 3 pm today since the hotel room will not be available until after 3 pm.  Bedside nursing and patient are aware.  The patient plans to discharge by car today with his son.  I called Tommi Rumps, RNCM with [facility] and updated him regarding patient's discharge today and plan to have home health RN start visits tomorrow at [facility] for wound vac dressing changes.  Patient will have congregational nursing follow up with the patient at the hotel.  Patient's [facility] wound vac is present in the hospital room and bedside nursing is aware and will transition the patient over to the home wound vac.  Discharge medications will be delivered from [place] today prior to discharge.  CM will continue to follow the patient and bedside nursing to discharge him after 3 pm today.   Final next level of care: Skilled Nursing Facility Barriers to Discharge: Continued Medical Work up, Homeless with medical needs   Patient Goals and CMS Choice Patient states their goals for this hospitalization and ongoing recovery are:: To return to his shared room with a roommate CMS Medicare.gov Compare Post Acute Care list provided to:: Patient Choice offered to / list presented to : Patient  Discharge Placement                       Discharge Plan and Services   Discharge Planning Services: CM Consult                                 Social Determinants of Health (SDOH) Interventions     Readmission Risk Interventions    11/08/2022     2:21 PM  Readmission Risk Prevention Plan  Transportation Screening Complete  PCP or Specialist Appt within 5-7 Days Complete  Home Care Screening Complete  Medication Review (RN CM) Complete

## 2022-11-16 NOTE — Discharge Summary (Signed)
Physician Discharge Summary  Joshua [name] OIN:[id number] DOB: 1956/02/22 DOA: 11/05/2022  PCP: [name], MD  Admit date: 11/05/2022 Discharge date: 11/16/2022  Admitted From: Home Disposition: Home  Recommendations for Outpatient Follow-up:  Follow up with PCP in 1 week with repeat CBC/BMP Outpatient follow-up with podiatry and ID.  Wound care/wound VAC care as per podiatry recommendations  follow up in ED if symptoms worsen or new appear   Home Health: Home health PT Equipment/Devices: None  Discharge Condition: Stable CODE STATUS: Full Diet recommendation: Heart healthy/carb modified diet/fluid restriction of up to 1500 cc a day  Brief/Interim Summary: 66 year old male with heart failure with preserved EF, CAD status post PCI and stenting, CKD stage IV, diabetes mellitus type 2, COPD, hypertension, obesity, prior right transmetatarsal amputation presented with worsening left foot wound despite outpatient oral antibiotics. He was found to have left foot osteomyelitis. He was started on broad-spectrum antibiotics. Podiatry and ID were consulted. He underwent bone biopsy and wound VAC placement. Wound culture grew Proteus mirabilis: Antibiotics have been switched to Augmentin by ID. PT recommended SNF placement. He is currently medically stable for discharge. Insurance denied SNF placement. TOC working on safe discharge plan.  TOC has arranged for patient to be discharged to a hotel room.  He will be discharged today with outpatient follow-up with PCP/ID and podiatry.  Discharge Diagnoses:   Left foot fifth toe osteomyelitis Chronic wound on left lateral aspect of the left foot History of right TMA -He was started on broad-spectrum antibiotics.  Podiatry and ID were consulted.  He underwent bone biopsy and wound VAC placement.  Wound culture grew Proteus mirabilis: Antibiotics have been switched to Augmentin by ID.  Currently also on doxycycline.  Will need doxycycline and  Augmentin for 4 weeks as per ID recommendations with outpatient follow-up with ID on 11/18/2022.  ID has cleared the patient for discharge. -Podiatry signed off on 11/08/2022.  Outpatient follow-up with podiatry 1 week after discharge -ABI on 11/01/2022 were normal - PT recommended SNF placement.  It looks like insurance has denied SNF placement.  Social worker working on safe discharge plan. -TOC has arranged for patient to be discharged to a hotel room.  He will be discharged today with outpatient follow-up with PCP/ID and podiatry.   CAD status post multiple PCI's Chronic diastolic heart failure Hyperlipidemia -Last echo in 08/2021  revealed grade 2 diastolic dysfunction, moderate dilatation of the left atrium and an EF of 55 to 60% -Continue Brilinta, aspirin, Lipitor, Imdur and Toprol-XL.  Continue diet and fluid restriction.  Outpatient follow-up with cardiology.  Resume Lasix on discharge but 40 mg once daily.   CKD stage IV -Creatinine at baseline.  Monitor intermittently as an outpatient.   Diabetes mellitus type 2 with hyperglycemia Diabetic neuropathy -Continue carb modified diet.  Continue low-dose of long-acting insulin on discharge.  Continue Farxiga and gabapentin -A1c 7.6.  Continue linagliptin   Morbid obesity -Outpatient follow-up   Leukocytosis -Resolved   Anemia of chronic disease -From chronic kidney disease.  Hemoglobin stable.  Monitor intermittently as an outpatient  Discharge Instructions  Discharge Instructions     Ambulatory referral to Infectious Disease   Complete by: As directed    Diet - low sodium heart healthy   Complete by: As directed    Diet Carb Modified   Complete by: As directed    Discharge wound care:   Complete by: As directed    As per podiatry recommendations   Increase activity slowly  Complete by: As directed       Allergies as of 11/16/2022       Reactions   Chlorthalidone Other (See Comments)   Causes dehydration,  kidneys shut down        Medication List     STOP taking these medications    acetaminophen 650 MG CR tablet Commonly known as: TYLENOL   cyclobenzaprine 10 MG tablet Commonly known as: FLEXERIL   HumaLOG KwikPen 200 UNIT/ML KwikPen Generic drug: insulin lispro   silver sulfADIAZINE 1 % cream Commonly known as: Silvadene       TAKE these medications    allopurinol 100 MG tablet Commonly known as: ZYLOPRIM Take 1 tablet (100 mg total) by mouth daily. Start taking on: November 17, 2022 What changed:  medication strength how much to take   amoxicillin-clavulanate 875-125 MG tablet Commonly known as: AUGMENTIN Take 1 tablet by mouth every 12 (twelve) hours.   ascorbic acid 500 MG tablet Commonly known as: VITAMIN C Take 1 tablet (500 mg total) by mouth 2 (two) times daily.   aspirin EC 81 MG tablet Take 81 mg by mouth daily.   atorvastatin 80 MG tablet Commonly known as: LIPITOR Take 80 mg by mouth daily.   Benefiber Chew Chew 1 tablet by mouth daily as needed (for constipation).   Brilinta 90 MG Tabs tablet Generic drug: ticagrelor Take 1 tablet by mouth twice daily What changed: how much to take   dapagliflozin propanediol 10 MG Tabs tablet Commonly known as: FARXIGA Take 10 mg by mouth daily.   doxycycline 100 MG tablet Commonly known as: VIBRA-TABS Take 1 tablet (100 mg total) by mouth every 12 (twelve) hours. What changed: when to take this   famotidine 20 MG tablet Commonly known as: PEPCID Take 20 mg by mouth at bedtime.   FreeStyle Libre 2 Sensor Misc APPLY TOPICALLY EVERY 14 DAYS   furosemide 80 MG tablet Commonly known as: LASIX Take 0.5 tablets (40 mg total) by mouth daily. Take 1 tablet by mouth twice daily What changed:  how much to take how to take this when to take this   gabapentin 100 MG capsule Commonly known as: NEURONTIN Take 1 capsule by mouth at bedtime   isosorbide mononitrate 60 MG 24 hr tablet Commonly known  as: IMDUR Take 3 tablets (180 mg total) by mouth daily.   Lantus SoloStar 100 UNIT/ML Solostar Pen Generic drug: insulin glargine Inject 5 Units into the skin 2 (two) times daily. What changed: See the new instructions.   latanoprost 0.005 % ophthalmic solution Commonly known as: XALATAN Place 1 drop into both eyes at bedtime.   linagliptin 5 MG Tabs tablet Commonly known as: TRADJENTA Take 1 tablet (5 mg total) by mouth daily. Start taking on: November 17, 2022   metoprolol succinate 25 MG 24 hr tablet Commonly known as: TOPROL-XL Take 3 tablets by mouth every day   nitroGLYCERIN 0.4 MG SL tablet Commonly known as: NITROSTAT Place 1 tablet (0.4 mg total) under the tongue every 5 (five) minutes as needed for chest pain.   oxyCODONE-acetaminophen 5-325 MG tablet Commonly known as: PERCOCET/ROXICET Take 1 tablet by mouth every 6 (six) hours as needed for moderate pain.   Ozempic (1 MG/DOSE) 4 MG/3ML Sopn Generic drug: Semaglutide (1 MG/DOSE) INJECT 1 MG SUBCUTANEOUSLY ONCE A WEEK What changed: See the new instructions.   pantoprazole 40 MG tablet Commonly known as: PROTONIX TAKE 1 TABLET BY MOUTH TWICE DAILY BEFORE MEAL(S) What changed: See the  new instructions.   polyethylene glycol 17 g packet Commonly known as: MIRALAX / GLYCOLAX Take 17 g by mouth daily as needed for moderate constipation.   Salonpas Lidocaine Plus 4-10 % Crea Generic drug: Lidocaine HCl-Benzyl Alcohol Apply 1 patch topically as needed (pain).   senna-docusate 8.6-50 MG tablet Commonly known as: Senokot-S Take 1 tablet by mouth 2 (two) times daily.               Discharge Care Instructions  (From admission, onward)           Start     Ordered   11/16/22 0000  Discharge wound care:       Comments: As per podiatry recommendations   11/16/22 1100            Follow-up Information     [employer] Follow up.   Why: [employer] wound vac company will be providing wound vac for  home.        [facility], [facility] Follow up.   Specialty: Home Health Services Why: Alvis Lemmings will call you and will be providing [facility] RN for wound Vac dressing changes 3 x per week once you are discharged home from the hospital. Contact information: [place] [address] [phone]         [name], MD. Schedule an appointment as soon as possible for a visit in 1 week(s).   Specialty: Family Medicine Contact information: [place] [address] [phone]         Yevonne Pax, DPM. Schedule an appointment as soon as possible for a visit in 1 week(s).   Specialty: Podiatry Contact information: [address] [place] [address] [phone]                Allergies  Allergen Reactions   Chlorthalidone Other (See Comments)    Causes dehydration, kidneys shut down    Consultations: Podiatry/ID   Procedures/Studies: DG Foot Complete Left  Result Date: 11/08/2022 Please see detailed radiograph report in office note.  DG Foot 2 Views Left  Result Date: 11/07/2022 CLINICAL DATA:  Open wound and osteomyelitis, postop. EXAM: LEFT FOOT - 2 VIEW COMPARISON:  11/05/2022 FINDINGS: Lucency at the base of the fifth metatarsal consistent with known osteomyelitis. There is a surgical drain in the soft tissues adjacent to the base of the fifth metatarsal. Osseous structures appear otherwise intact. IMPRESSION: Lucency consistent with known osteomyelitis involving the base of fifth metatarsal with adjacent soft tissue drain in place. Electronically Signed   By: Sammie Bench M.D.   On: 11/07/2022 13:13   MR FOOT LEFT W WO CONTRAST  Result Date: 11/06/2022 CLINICAL DATA:  Foot pain and swelling. EXAM: MRI OF THE LEFT FOREFOOT WITHOUT AND WITH CONTRAST TECHNIQUE: Multiplanar, multisequence MR imaging of the left foot was performed both before and after administration of  intravenous contrast. CONTRAST:  21mL GADAVIST GADOBUTROL 1 MMOL/ML IV SOLN COMPARISON:  Radiographs, same date. FINDINGS: Open wound noted along the lateral aspect of the foot near the base of the fifth metatarsal. A remote avulsion fracture is noted. Diffuse abnormal T1 and T2 signal intensity in the fifth metatarsal base and subsequent abnormal contrast enhancement consistent with osteomyelitis. No findings for septic arthritis. Diffuse cellulitis and myofasciitis but no discrete drainable soft tissue abscess. Moderate age advanced midfoot degenerative changes with scattered erosive findings. Neuropathic disease versus inflammatory arthropathy. The tibiotalar and subtalar joints are maintained. The  major tendons ligaments are intact. IMPRESSION: 1. Open wound along the lateral aspect of the foot near the base of the fifth metatarsal with underlying osteomyelitis involving the fifth metatarsal base. 2. Diffuse cellulitis and myofasciitis but no discrete drainable soft tissue abscess. 3. Moderate age advanced midfoot degenerative changes with scattered erosive findings. Neuropathic disease versus inflammatory arthropathy. Electronically Signed   By: Marijo Sanes M.D.   On: 11/06/2022 08:58   DG Foot Complete Left  Result Date: 11/05/2022 CLINICAL DATA:  Pain and diabetic foot ulcer EXAM: LEFT FOOT - COMPLETE 3+ VIEW COMPARISON:  None Available. FINDINGS: Chronic avulsion fracture of the base of the fifth metatarsal. There is cortical irregularity with loss of cortical definition on both sides of the fracture fragments about the fracture line. No acute fracture or dislocation. Soft tissue swelling about the fifth metatarsal base with laceration/ulcer about the lateral aspect of the foot at the fifth metatarsal base. Soft tissue swelling about the dorsum of the foot. Degenerative arthritis about the midfoot. IMPRESSION: Findings concerning for osteomyelitis about the chronic avulsion fracture of the fifth  metatarsal base. Consider MRI for further evaluation. Adjacent soft tissue ulcer about the fifth metatarsal with soft tissue swelling about the lateral and dorsal foot. Electronically Signed   By: Placido Sou M.D.   On: 11/05/2022 17:23   VAS [place] PAD ABI  Result Date: 11/02/2022  LOWER EXTREMITY DOPPLER STUDY Patient Name:  IRL BODIE  Date of Exam:   11/01/2022 Medical Rec #: [id number]         Accession #:    [id number] Date of Birth: [birth year]         Patient Gender: M Patient Age:   56 years Exam Location:  [facility] Procedure:      VAS [place] ABI WITH/WO TBI Referring Phys: Celesta Gentile --------------------------------------------------------------------------------  Indications: Patient has an ulceration of the left lateral aspect of the fifth              metatarsal base at the site of a previous bilster from shoe              irritation. History of right transmetatarsal amputation. Patient              reports neuropathy symptoms in his lower legs and feet, though not              overly painful when walking, he does have discomfort related to the              wound. High Risk Factors: Hypertension, hyperlipidemia, Diabetes, no history of                    smoking, coronary artery disease.  Comparison Study: Pre-CABG Doppler 07/21/16 showed right ABI of 0.90 and left ABI                   of 1.05. Performing Technologist: Mariane Masters RVT  Examination Guidelines: A complete evaluation includes at minimum, Doppler waveform signals and systolic blood pressure reading at the level of bilateral brachial, anterior tibial, and posterior tibial arteries, when vessel segments are accessible. Bilateral testing is considered an integral part of a complete examination. Photoelectric Plethysmograph (PPG) waveforms and toe systolic pressure readings are included as required and additional duplex testing as needed. Limited examinations for reoccurring indications may be performed as noted.  ABI Findings:  +---------+------------------+-----+-----------+--------------------+ Right    Rt Pressure (mmHg)IndexWaveform   Comment              +---------+------------------+-----+-----------+--------------------+  Brachial 143                                                    +---------+------------------+-----+-----------+--------------------+ PTA      189               1.28 triphasic                       +---------+------------------+-----+-----------+--------------------+ PERO     133               0.90 multiphasic                     +---------+------------------+-----+-----------+--------------------+ DP       176               1.19 biphasic                        +---------+------------------+-----+-----------+--------------------+ Great Toe                                  transmetatarsal amp. +---------+------------------+-----+-----------+--------------------+ +---------+------------------+-----+-----------+-------+ Left     Lt Pressure (mmHg)IndexWaveform   Comment +---------+------------------+-----+-----------+-------+ Brachial 148                                       +---------+------------------+-----+-----------+-------+ PTA      170               1.15 triphasic          +---------+------------------+-----+-----------+-------+ PERO     167               1.17 triphasic          +---------+------------------+-----+-----------+-------+ DP       165               1.11 multiphasic        +---------+------------------+-----+-----------+-------+ Great Toe103               0.70 Normal             +---------+------------------+-----+-----------+-------+ +-------+-----------+-----------+------------+------------+ ABI/TBIToday's ABIToday's TBIPrevious ABIPrevious TBI +-------+-----------+-----------+------------+------------+ Right  1.28       amp.       0.90                      +-------+-----------+-----------+------------+------------+ Left   1.15       0.70       1.05                     +-------+-----------+-----------+------------+------------+  Summary: Right: Resting right ankle-brachial index is within normal range. Right transmetatarsal amputation. Left: Resting left ankle-brachial index is within normal range. The left toe-brachial index is normal. Guidlines:  Patients with an ABI >1.2 or <0.9 have a high risk of peripheral atherosclerosis. Patients with asymptomatic peripheral atherosclerosis can be managed without further referral, at the discretion of the ordering provider. Guidelines for medical therapy include the following: - Patients with asymptomatic peripheral atherosclerosis can be managed without further referral, at the discretion of the ordering provider. The following therapies are recommendations from the Society of Vascular Surgery for patients with peripheral  arterial disease: _ Multidisciplinary comprehensive smoking cessation interventions. _ Blood glucose control with goal A1c < 7%. _ Blood pressure control, < 140/90 mmHg. _ Statin therapy, goal LDL-C at least <100 mg/dL. _ Supervised exercise program (30-60 min of walking, three times a week) for patients with intermittent claudication. _ Aspirin $RemoveB'81mg'MrwjRgTx$  by mouth daily  *See table(s) above for measurements and observations.  Electronically signed by Carlyle Dolly MD on 11/02/2022 at 8:30:00 AM.    Final       Subjective: Patient seen and examined at bedside.  Denies worsening shortness breath, nausea, vomiting or chest pain.  Feels okay to be discharged today Discharge Exam: Vitals:   11/16/22 0837 11/16/22 0839  BP: (!) 146/81 (!) 146/81  Pulse: 89 89  Resp: 18 15  Temp: 97.7 F (36.5 C) 97.7 F (36.5 C)  SpO2: 99% 99%    General: Pt is alert, awake, not in acute distress, currently on room air cardiovascular: rate controlled, S1/S2 + Respiratory: bilateral decreased breath sounds at  bases with some scattered crackles Abdominal: Soft, morbidly obese, NT, ND, bowel sounds + Extremities: Bilateral lower extremity edema present; Wound VAC present on left foot. Right TMA present.     The results of significant diagnostics from this hospitalization (including imaging, microbiology, ancillary and laboratory) are listed below for reference.     Microbiology: Recent Results (from the past 240 hour(s))  Surgical PCR screen     Status: None   Collection Time: 11/06/22  5:42 PM   Specimen: Nasal Mucosa; Nasal Swab  Result Value Ref Range Status   MRSA, PCR NEGATIVE NEGATIVE Final   Staphylococcus aureus NEGATIVE NEGATIVE Final    Comment: (NOTE) The Xpert SA Assay (FDA approved for NASAL specimens in patients 20 years of age and older), is one component of a comprehensive surveillance program. It is not intended to diagnose infection nor to guide or monitor treatment. Performed at [facility], [place] [address] [place], [place] [address]   Aerobic/Anaerobic Culture w Gram Stain (surgical/deep wound)     Status: None   Collection Time: 11/07/22 12:23 PM   Specimen: Wound  Result Value Ref Range Status   Specimen Description BONE  Final   Special Requests LEFT FOOT  Final   Gram Stain NO ORGANISMS SEEN NO WBC SEEN   Final   Culture   Final    RARE PROTEUS MIRABILIS NO ANAEROBES ISOLATED Performed at [facility], [place] [address] [place],  [address]    Report Status 11/12/2022 FINAL  Final   Organism ID, Bacteria PROTEUS MIRABILIS  Final      Susceptibility   Proteus mirabilis - MIC*    AMPICILLIN >=32 RESISTANT Resistant     CEFAZOLIN 8 SENSITIVE Sensitive     CEFEPIME <=0.12 SENSITIVE Sensitive     CEFTAZIDIME <=1 SENSITIVE Sensitive     CEFTRIAXONE <=0.25 SENSITIVE Sensitive     CIPROFLOXACIN <=0.25 SENSITIVE Sensitive     GENTAMICIN <=1 SENSITIVE Sensitive     IMIPENEM 2 SENSITIVE Sensitive     TRIMETH/SULFA >=320 RESISTANT Resistant      AMPICILLIN/SULBACTAM 8 SENSITIVE Sensitive     PIP/TAZO <=4 SENSITIVE Sensitive     * RARE PROTEUS MIRABILIS  Aerobic/Anaerobic Culture w Gram Stain (surgical/deep wound)     Status: None   Collection Time: 11/07/22 12:24 PM   Specimen: PATH Bone resection; Tissue  Result Value Ref Range Status   Specimen Description WOUND  Final   Special Requests LEFT  FOOT DEEP  Final   Gram Stain   Final    NO ORGANISMS SEEN NO WBC SEEN Performed at [facility], [place] [address], [place], [place] [address]    Culture   Final    FEW PROTEUS MIRABILIS SUSCEPTIBILITIES PERFORMED ON PREVIOUS CULTURE WITHIN THE LAST 5 DAYS. MIXED ANAEROBIC FLORA PRESENT.  CALL LAB IF FURTHER IID REQUIRED.    Report Status 11/12/2022 FINAL  Final     Labs: BNP (last 3 results) No results for input(s): "BNP" in the last 8760 hours. Basic Metabolic Panel: Recent Labs  Lab 11/10/22 0607  NA 135  K 3.8  CL 103  CO2 23  GLUCOSE 177*  BUN 50*  CREATININE 2.01*  CALCIUM 8.7*   Liver Function Tests: No results for input(s): "AST", "ALT", "ALKPHOS", "BILITOT", "PROT", "ALBUMIN" in the last 168 hours. No results for input(s): "LIPASE", "AMYLASE" in the last 168 hours. No results for input(s): "AMMONIA" in the last 168 hours. CBC: No results for input(s): "WBC", "NEUTROABS", "HGB", "HCT", "MCV", "PLT" in the last 168 hours. Cardiac Enzymes: No results for input(s): "CKTOTAL", "CKMB", "CKMBINDEX", "TROPONINI" in the last 168 hours. BNP: Invalid input(s): "POCBNP" CBG: Recent Labs  Lab 11/15/22 0757 11/15/22 1204 11/15/22 1844 11/15/22 2158 11/16/22 0841  GLUCAP 165* 209* 138* 156* 163*   D-Dimer No results for input(s): "DDIMER" in the last 72 hours. Hgb A1c No results for input(s): "HGBA1C" in the last 72 hours. Lipid Profile No results for input(s): "CHOL", "HDL", "LDLCALC", "TRIG", "CHOLHDL", "LDLDIRECT" in the last 72 hours. Thyroid function studies No results for input(s): "TSH",  "T4TOTAL", "T3FREE", "THYROIDAB" in the last 72 hours.  Invalid input(s): "FREET3" Anemia work up No results for input(s): "VITAMINB12", "FOLATE", "FERRITIN", "TIBC", "IRON", "RETICCTPCT" in the last 72 hours. Urinalysis    Component Value Date/Time   COLORURINE YELLOW 09/24/2020 1423   APPEARANCEUR CLOUDY (A) 09/24/2020 1423   LABSPEC 1.011 09/24/2020 1423   PHURINE 5.0 09/24/2020 1423   GLUCOSEU 150 (A) 09/24/2020 1423   HGBUR LARGE (A) 09/24/2020 1423   BILIRUBINUR NEGATIVE 09/24/2020 1423   KETONESUR NEGATIVE 09/24/2020 1423   PROTEINUR 100 (A) 09/24/2020 1423   UROBILINOGEN 0.2 11/01/2011 1410   NITRITE NEGATIVE 09/24/2020 1423   LEUKOCYTESUR NEGATIVE 09/24/2020 1423   Sepsis Labs No results for input(s): "WBC" in the last 168 hours.  Invalid input(s): "PROCALCITONIN", "LACTICIDVEN" Microbiology Recent Results (from the past 240 hour(s))  Surgical PCR screen     Status: None   Collection Time: 11/06/22  5:42 PM   Specimen: Nasal Mucosa; Nasal Swab  Result Value Ref Range Status   MRSA, PCR NEGATIVE NEGATIVE Final   Staphylococcus aureus NEGATIVE NEGATIVE Final    Comment: (NOTE) The Xpert SA Assay (FDA approved for NASAL specimens in patients 58 years of age and older), is one component of a comprehensive surveillance program. It is not intended to diagnose infection nor to guide or monitor treatment. Performed at [facility], [place] [address] [place], [place] [address]   Aerobic/Anaerobic Culture w Gram Stain (surgical/deep wound)     Status: None   Collection Time: 11/07/22 12:23 PM   Specimen: Wound  Result Value Ref Range Status   Specimen Description BONE  Final   Special Requests LEFT FOOT  Final   Gram Stain NO ORGANISMS SEEN NO WBC SEEN   Final   Culture   Final    RARE PROTEUS MIRABILIS NO ANAEROBES ISOLATED Performed at [facility], [address] [place], [place] [address]    Report Status 11/12/2022 FINAL  Final   Organism ID,  Bacteria PROTEUS MIRABILIS  Final      Susceptibility   Proteus mirabilis - MIC*    AMPICILLIN >=32 RESISTANT Resistant     CEFAZOLIN 8 SENSITIVE Sensitive     CEFEPIME <=0.12 SENSITIVE Sensitive     CEFTAZIDIME <=1 SENSITIVE Sensitive     CEFTRIAXONE <=0.25 SENSITIVE Sensitive     CIPROFLOXACIN <=0.25 SENSITIVE Sensitive     GENTAMICIN <=1 SENSITIVE Sensitive     IMIPENEM 2 SENSITIVE Sensitive     TRIMETH/SULFA >=320 RESISTANT Resistant     AMPICILLIN/SULBACTAM 8 SENSITIVE Sensitive     PIP/TAZO <=4 SENSITIVE Sensitive     * RARE PROTEUS MIRABILIS  Aerobic/Anaerobic Culture w Gram Stain (surgical/deep wound)     Status: None   Collection Time: 11/07/22 12:24 PM   Specimen: PATH Bone resection; Tissue  Result Value Ref Range Status   Specimen Description WOUND  Final   Special Requests LEFT FOOT DEEP  Final   Gram Stain   Final    NO ORGANISMS SEEN NO WBC SEEN Performed at [facility], [address] [place], [place] [address]    Culture   Final    FEW PROTEUS MIRABILIS SUSCEPTIBILITIES PERFORMED ON PREVIOUS CULTURE WITHIN THE LAST 5 DAYS. MIXED ANAEROBIC FLORA PRESENT.  CALL LAB IF FURTHER IID REQUIRED.    Report Status 11/12/2022 FINAL  Final     Time coordinating discharge: 35 minutes  SIGNED:   Aline August, MD  [facility] 11/16/2022, 11:04 AM

## 2022-11-18 ENCOUNTER — Encounter: Payer: Self-pay | Admitting: Internal Medicine

## 2022-11-18 ENCOUNTER — Other Ambulatory Visit: Payer: Self-pay

## 2022-11-18 ENCOUNTER — Ambulatory Visit (INDEPENDENT_AMBULATORY_CARE_PROVIDER_SITE_OTHER): Payer: Medicare Other | Admitting: Internal Medicine

## 2022-11-18 VITALS — BP 158/83 | HR 105 | Temp 97.9°F | Ht 69.0 in | Wt 272.0 lb

## 2022-11-18 DIAGNOSIS — M869 Osteomyelitis, unspecified: Secondary | ICD-10-CM | POA: Diagnosis not present

## 2022-11-18 DIAGNOSIS — E11628 Type 2 diabetes mellitus with other skin complications: Secondary | ICD-10-CM | POA: Diagnosis not present

## 2022-11-18 DIAGNOSIS — L089 Local infection of the skin and subcutaneous tissue, unspecified: Secondary | ICD-10-CM

## 2022-11-18 NOTE — Patient Instructions (Signed)
Continue doxycycline and amoxicillin-clavulonate antibiotics until 12/17/22. Please call us 1-2 weeks before you run out so we can renew it   See [place] again in 4 weeks  Labs today  Follow up with your podiatrist as well

## 2022-11-18 NOTE — Progress Notes (Signed)
[facility] for Infectious Disease  Patient Active Problem List   Diagnosis Date Noted   Protein-calorie malnutrition, severe 11/08/2022   Osteomyelitis ([place]) 11/06/2022   Diabetic foot infection ([place]) 11/06/2022   Osteomyelitis of fifth toe of left foot ([place]) 11/05/2022   (HFpEF) heart failure with preserved ejection fraction ([place]) 11/05/2022   CKD (chronic kidney disease), stage IV ([place]) 11/05/2022   Dysphagia 03/16/2022   History of colonic polyps 03/16/2022   Spinal stenosis of lumbar region 11/03/2021   Mixed diabetic hyperlipidemia associated with type 2 diabetes mellitus ([place]) 08/11/2021   Chronic low back pain    Hypokalemia 09/25/2020   Insulin dependent type 2 diabetes mellitus ([place]) 09/24/2020   Unstable angina ([place]) 08/17/2019   Chest pain 08/16/2019   NSTEMI (non-ST elevated myocardial infarction) ([place]) 04/06/2019   ACS (acute coronary syndrome) ([place]) 04/06/2019   Sepsis ([place]) 03/15/2018   DKA (diabetic ketoacidoses) 03/15/2018   Special screening for malignant neoplasms, colon 08/22/2017   Acute renal failure superimposed on stage 3 chronic kidney disease ([place]) 05/19/2017   Hypertension associated with diabetes ([place])    Status post transmetatarsal amputation of foot, right ([place]) 11/08/2016   Diabetic polyneuropathy associated with type 2 diabetes mellitus ([place]) 11/08/2016   Pure hypercholesterolemia    AKI (acute kidney injury) ([place])    Acute blood loss anemia    DM type 2 with diabetic peripheral neuropathy ([place])    Physical debility 08/03/2016   Chronic osteomyelitis of right foot ([place])    Cellulitis and abscess of leg, except foot    Penetrating wound of right foot    CKD (chronic kidney disease) stage 3, GFR 30-59 ml/min (HCC)    Unstable angina pectoris ([place]) 07/18/2016   Chronic combined systolic and diastolic congestive heart failure (HCC)    Chest pain with moderate risk for cardiac etiology 06/24/2014   CAD S/P multiple PCIs 06/24/2014    Sleep apnea- on C-pap 06/24/2014   Class 3 severe obesity with serious comorbidity and body mass index (BMI) of 45.0 to 49.9 in adult [facility]) 11/10/2013   Hyperglycemia 10/29/2013   Hyperlipidemia 01/12/2011   Benign essential HTN 01/12/2011   GERD 01/12/2011      Subjective:    Patient ID: Joshua [name], male    DOB: [birth year], 66 y.o.   MRN: [id number]  No chief complaint on file.   HPI:  Joshua [name] is a 66 y.o. male with gout, htn, hlp, dm2 here for follow up of diabetic foot ulcer/5th mt om  He was on doxycycline prior to recent admission underwent I&D and biopsy 11/25 that showed proteus He was discharged on doxy/augmentin  ------------ 11/18/22 id clinic f/u [facility] concerns about how the wound looks and asked him to go to hospital... but he wants to check at our clinic first No f/c Compliant with doxy/augmentin; no n/v/diarrhea/rash He actually feels better than when he was in the hospital He is walking on his foot He has a wound vac since discharge but that came off accidentally last night. This morning is when the hh nurse first saw it and was concerned   Allergies  Allergen Reactions   Chlorthalidone Other (See Comments)    Causes dehydration, kidneys shut down      Outpatient Medications Prior to Visit  Medication Sig Dispense Refill   allopurinol (ZYLOPRIM) 100 MG tablet Take 1 tablet (100 mg total) by mouth daily. 30 tablet 0   amoxicillin-clavulanate (AUGMENTIN) 875-125 MG  tablet Take 1 tablet by mouth every 12 (twelve) hours. 60 tablet 0   ascorbic acid (VITAMIN C) 500 MG tablet Take 1 tablet (500 mg total) by mouth 2 (two) times daily. 60 tablet 0   aspirin EC 81 MG tablet Take 81 mg by mouth daily.      atorvastatin (LIPITOR) 80 MG tablet Take 80 mg by mouth daily.     Continuous Blood Gluc Sensor (FREESTYLE LIBRE 2 SENSOR) MISC APPLY TOPICALLY EVERY 14 DAYS 2 each 3   dapagliflozin propanediol (FARXIGA) 10 MG TABS tablet Take 10 mg by mouth  daily.     doxycycline (VIBRA-TABS) 100 MG tablet Take 1 tablet (100 mg total) by mouth every 12 (twelve) hours. 60 tablet 0   famotidine (PEPCID) 20 MG tablet Take 20 mg by mouth at bedtime.     furosemide (LASIX) 80 MG tablet Take 0.5 tablets (40 mg total) by mouth daily. Take 1 tablet by mouth twice daily     gabapentin (NEURONTIN) 100 MG capsule Take 1 capsule by mouth at bedtime 30 capsule 5   insulin glargine (LANTUS SOLOSTAR) 100 UNIT/ML Solostar Pen Inject 5 Units into the skin 2 (two) times daily. 45 mL 0   isosorbide mononitrate (IMDUR) 60 MG 24 hr tablet Take 3 tablets (180 mg total) by mouth daily. 90 tablet 11   latanoprost (XALATAN) 0.005 % ophthalmic solution Place 1 drop into both eyes at bedtime.     Lidocaine HCl-Benzyl Alcohol (SALONPAS LIDOCAINE PLUS) 4-10 % CREA Apply 1 patch topically as needed (pain).     linagliptin (TRADJENTA) 5 MG TABS tablet Take 1 tablet (5 mg total) by mouth daily. 30 tablet 0   metoprolol succinate (TOPROL-XL) 25 MG 24 hr tablet Take 3 tablets by mouth every day 90 tablet 11   nitroGLYCERIN (NITROSTAT) 0.4 MG SL tablet Place 1 tablet (0.4 mg total) under the tongue every 5 (five) minutes as needed for chest pain. 25 tablet 3   oxyCODONE-acetaminophen (PERCOCET/ROXICET) 5-325 MG tablet Take 1 tablet by mouth every 6 (six) hours as needed for moderate pain. 20 tablet 0   pantoprazole (PROTONIX) 40 MG tablet TAKE 1 TABLET BY MOUTH TWICE DAILY BEFORE MEAL(S) (Patient taking differently: Take 40 mg by mouth 2 (two) times daily.) 180 tablet 0   polyethylene glycol powder (GLYCOLAX/MIRALAX) 17 GM/SCOOP powder Take 1 capful  (17 g) by mouth daily as needed for moderate constipation. 238 g 0   senna-docusate (SENOKOT-S) 8.6-50 MG tablet Take 1 tablet by mouth 2 (two) times daily. 60 tablet 0   ticagrelor (BRILINTA) 90 MG TABS tablet Take 1 tablet by mouth twice daily (Patient taking differently: Take 90 mg by mouth 2 (two) times daily.) 180 tablet 3   Wheat  Dextrin (BENEFIBER) CHEW Chew 1 tablet by mouth daily as needed (for constipation).     No facility-administered medications prior to visit.     Social History   Socioeconomic History   Marital status: Married    Spouse name: Dorian Pod   Number of children: 3   Years of education: Not on file   Highest education level: Not on file  Occupational History    Comment: disabled  Tobacco Use   Smoking status: Never   Smokeless tobacco: Never  Vaping Use   Vaping Use: Never used  Substance and Sexual Activity   Alcohol use: Yes    Alcohol/week: 6.0 standard drinks of alcohol    Types: 6 Cans of beer per week    Comment:  social    Drug use: No   Sexual activity: Yes    Partners: Female  Other Topics Concern   Not on file  Social History Narrative   Not on file   Social Determinants of Health   Financial Resource Strain: Not on file  Food Insecurity: Not on file  Transportation Needs: No Transportation Needs (11/06/2022)   PRAPARE - Hydrologist (Medical): No    Lack of Transportation (Non-Medical): No  Physical Activity: Not on file  Stress: Not on file  Social Connections: Not on file  Intimate Partner Violence: Not on file      Review of Systems    All other ros negative   Objective:    There were no vitals taken for this visit. Nursing note and vital signs reviewed.  Physical Exam     General/constitutional: no distress, pleasant HEENT: Normocephalic, PER, Conj Clear, EOMI, Oropharynx clear Neck supple CV: rrr no mrg Lungs: clear to auscultation, normal respiratory effort Abd: Soft, Nontender Ext: no edema Skin/msk -- it does smell odd but I query if it is the graft material smell -- no purulence or tenderness/fluctuance or discharge -- some staples are falling off    Labs: Lab Results  Component Value Date   WBC 9.0 11/06/2022   HGB 12.1 (L) 11/06/2022   HCT 38.6 (L) 11/06/2022   MCV 87.9 11/06/2022   PLT 245  29/92/4268   Last metabolic panel Lab Results  Component Value Date   GLUCOSE 177 (H) 11/10/2022   NA 135 11/10/2022   K 3.8 11/10/2022   CL 103 11/10/2022   CO2 23 11/10/2022   BUN 50 (H) 11/10/2022   CREATININE 2.01 (H) 11/10/2022   GFRNONAA 36 (L) 11/10/2022   CALCIUM 8.7 (L) 11/10/2022   PHOS 2.2 (L) 03/20/2018   PROT 7.2 08/11/2021   ALBUMIN 3.9 08/11/2021   LABGLOB 3.3 08/11/2021   AGRATIO 1.2 08/11/2021   BILITOT <0.2 08/11/2021   ALKPHOS 183 (H) 08/11/2021   AST 16 08/11/2021   ALT 12 08/11/2021   ANIONGAP 9 11/10/2022   Crp: 11/25     13.1 (<1)    Micro:  Serology:  Imaging:  Assessment & Plan:   Problem List Items Addressed This Visit       Endocrine   Diabetic foot infection ([place]) - Primary     Musculoskeletal and Integument   Osteomyelitis ([place])      No orders of the defined types were placed in this encounter.    Abx: 11/27-c doxy/augmentin  11/24-27 vanc/piptazo     Outpatient doxycycline                                                     Assessment: 66 yo male basically homeless/poor living situation, dm2, ckd4, cad, admitted 11/24 for chronic left foot ulcer with osteomyelitis   Mri imaging 5th mt osteomyelitis, however this would need to be confirmed by a pathologic evaluation as false positive read rate is high   He underwent bone biopsy/culture of the 5th mt on 11/26 growing non-esbl proteus.   As he was taking doxycycline up until the admission, we continued doxycycline and added amox-clav with plan to do 4-6 weeks of treatment starting 11/24  Today 11/18/22 will repeat cbc//cmp/crp It is too early to say but no  clinically change grossly; it actually appears better. We will plan to see him again in around 4 weeks  There is no sign of sepsis or worsening infection. I feel he can continue antibiotics, but he can go ahead and asks his podiatrist on what to do with the wound vac    Continue doxy/amox-clav. Tentative end date  12/17/2022  Follow-up: Return in about 4 weeks (around 12/16/2022).      Jabier Mutton, Deputy for Infectious Disease [facility] 11/18/2022, 3:42 PM

## 2022-11-19 ENCOUNTER — Telehealth: Payer: Self-pay | Admitting: *Deleted

## 2022-11-19 DIAGNOSIS — Z89431 Acquired absence of right foot: Secondary | ICD-10-CM | POA: Diagnosis not present

## 2022-11-19 DIAGNOSIS — M869 Osteomyelitis, unspecified: Secondary | ICD-10-CM | POA: Diagnosis not present

## 2022-11-19 DIAGNOSIS — E1122 Type 2 diabetes mellitus with diabetic chronic kidney disease: Secondary | ICD-10-CM | POA: Diagnosis not present

## 2022-11-19 DIAGNOSIS — B964 Proteus (mirabilis) (morganii) as the cause of diseases classified elsewhere: Secondary | ICD-10-CM | POA: Diagnosis not present

## 2022-11-19 DIAGNOSIS — E1169 Type 2 diabetes mellitus with other specified complication: Secondary | ICD-10-CM | POA: Diagnosis not present

## 2022-11-19 DIAGNOSIS — Z792 Long term (current) use of antibiotics: Secondary | ICD-10-CM | POA: Diagnosis not present

## 2022-11-19 DIAGNOSIS — E43 Unspecified severe protein-calorie malnutrition: Secondary | ICD-10-CM | POA: Diagnosis not present

## 2022-11-19 DIAGNOSIS — E1165 Type 2 diabetes mellitus with hyperglycemia: Secondary | ICD-10-CM | POA: Diagnosis not present

## 2022-11-19 DIAGNOSIS — Z794 Long term (current) use of insulin: Secondary | ICD-10-CM | POA: Diagnosis not present

## 2022-11-19 DIAGNOSIS — I2510 Atherosclerotic heart disease of native coronary artery without angina pectoris: Secondary | ICD-10-CM | POA: Diagnosis not present

## 2022-11-19 DIAGNOSIS — E1159 Type 2 diabetes mellitus with other circulatory complications: Secondary | ICD-10-CM | POA: Diagnosis not present

## 2022-11-19 DIAGNOSIS — L97528 Non-pressure chronic ulcer of other part of left foot with other specified severity: Secondary | ICD-10-CM | POA: Diagnosis not present

## 2022-11-19 DIAGNOSIS — E11621 Type 2 diabetes mellitus with foot ulcer: Secondary | ICD-10-CM | POA: Diagnosis not present

## 2022-11-19 DIAGNOSIS — M19072 Primary osteoarthritis, left ankle and foot: Secondary | ICD-10-CM | POA: Diagnosis not present

## 2022-11-19 DIAGNOSIS — N184 Chronic kidney disease, stage 4 (severe): Secondary | ICD-10-CM | POA: Diagnosis not present

## 2022-11-19 DIAGNOSIS — E785 Hyperlipidemia, unspecified: Secondary | ICD-10-CM | POA: Diagnosis not present

## 2022-11-19 DIAGNOSIS — I152 Hypertension secondary to endocrine disorders: Secondary | ICD-10-CM | POA: Diagnosis not present

## 2022-11-19 DIAGNOSIS — Z7982 Long term (current) use of aspirin: Secondary | ICD-10-CM | POA: Diagnosis not present

## 2022-11-19 DIAGNOSIS — E1140 Type 2 diabetes mellitus with diabetic neuropathy, unspecified: Secondary | ICD-10-CM | POA: Diagnosis not present

## 2022-11-19 DIAGNOSIS — L03116 Cellulitis of left lower limb: Secondary | ICD-10-CM | POA: Diagnosis not present

## 2022-11-19 DIAGNOSIS — Z7984 Long term (current) use of oral hypoglycemic drugs: Secondary | ICD-10-CM | POA: Diagnosis not present

## 2022-11-19 DIAGNOSIS — J449 Chronic obstructive pulmonary disease, unspecified: Secondary | ICD-10-CM | POA: Diagnosis not present

## 2022-11-19 DIAGNOSIS — I5032 Chronic diastolic (congestive) heart failure: Secondary | ICD-10-CM | POA: Diagnosis not present

## 2022-11-19 LAB — CBC WITH DIFFERENTIAL/PLATELET
Absolute Monocytes: 1000 cells/uL — ABNORMAL HIGH (ref 200–950)
Basophils Absolute: 25 cells/uL (ref 0–200)
Basophils Relative: 0.2 %
Eosinophils Absolute: 25 cells/uL (ref 15–500)
Eosinophils Relative: 0.2 %
HCT: 37.3 % — ABNORMAL LOW (ref 38.5–50.0)
Hemoglobin: 12.2 g/dL — ABNORMAL LOW (ref 13.2–17.1)
Lymphs Abs: 1188 cells/uL (ref 850–3900)
MCH: 27.8 pg (ref 27.0–33.0)
MCHC: 32.7 g/dL (ref 32.0–36.0)
MCV: 85 fL (ref 80.0–100.0)
MPV: 10.7 fL (ref 7.5–12.5)
Monocytes Relative: 8 %
Neutro Abs: 10263 cells/uL — ABNORMAL HIGH (ref 1500–7800)
Neutrophils Relative %: 82.1 %
Platelets: 274 10*3/uL (ref 140–400)
RBC: 4.39 10*6/uL (ref 4.20–5.80)
RDW: 16.6 % — ABNORMAL HIGH (ref 11.0–15.0)
Total Lymphocyte: 9.5 %
WBC: 12.5 10*3/uL — ABNORMAL HIGH (ref 3.8–10.8)

## 2022-11-19 LAB — COMPLETE METABOLIC PANEL WITH GFR
AG Ratio: 0.7 (calc) — ABNORMAL LOW (ref 1.0–2.5)
ALT: 17 U/L (ref 9–46)
AST: 20 U/L (ref 10–35)
Albumin: 3.3 g/dL — ABNORMAL LOW (ref 3.6–5.1)
Alkaline phosphatase (APISO): 154 U/L — ABNORMAL HIGH (ref 35–144)
BUN/Creatinine Ratio: 23 (calc) — ABNORMAL HIGH (ref 6–22)
BUN: 48 mg/dL — ABNORMAL HIGH (ref 7–25)
CO2: 19 mmol/L — ABNORMAL LOW (ref 20–32)
Calcium: 9.2 mg/dL (ref 8.6–10.3)
Chloride: 103 mmol/L (ref 98–110)
Creat: 2.11 mg/dL — ABNORMAL HIGH (ref 0.70–1.35)
Globulin: 4.8 g/dL (calc) — ABNORMAL HIGH (ref 1.9–3.7)
Glucose, Bld: 183 mg/dL — ABNORMAL HIGH (ref 65–99)
Potassium: 4.4 mmol/L (ref 3.5–5.3)
Sodium: 137 mmol/L (ref 135–146)
Total Bilirubin: 0.5 mg/dL (ref 0.2–1.2)
Total Protein: 8.1 g/dL (ref 6.1–8.1)
eGFR: 34 mL/min/{1.73_m2} — ABNORMAL LOW (ref >=60)

## 2022-11-19 LAB — C-REACTIVE PROTEIN: CRP: 240.7 mg/L — ABNORMAL HIGH (ref <8.0)

## 2022-11-19 NOTE — Telephone Encounter (Signed)
Called Joshua [name] w/ [employer] giving approval for the wound vac 3 times a week for 8 weeks, verbalized understanding and had forgot to ask for PT, a Education officer, museum. The patient is still living in a hotel.please advise.

## 2022-11-19 NOTE — Telephone Encounter (Signed)
April w/ [facility] is calling for a Verbal order to change the wound vac 3 times a week for 8 weeks, please advise.

## 2022-11-22 DIAGNOSIS — B964 Proteus (mirabilis) (morganii) as the cause of diseases classified elsewhere: Secondary | ICD-10-CM | POA: Diagnosis not present

## 2022-11-22 DIAGNOSIS — L03116 Cellulitis of left lower limb: Secondary | ICD-10-CM | POA: Diagnosis not present

## 2022-11-22 DIAGNOSIS — E1169 Type 2 diabetes mellitus with other specified complication: Secondary | ICD-10-CM | POA: Diagnosis not present

## 2022-11-22 DIAGNOSIS — Z792 Long term (current) use of antibiotics: Secondary | ICD-10-CM | POA: Diagnosis not present

## 2022-11-22 DIAGNOSIS — E1140 Type 2 diabetes mellitus with diabetic neuropathy, unspecified: Secondary | ICD-10-CM | POA: Diagnosis not present

## 2022-11-22 DIAGNOSIS — Z89431 Acquired absence of right foot: Secondary | ICD-10-CM | POA: Diagnosis not present

## 2022-11-22 DIAGNOSIS — I2510 Atherosclerotic heart disease of native coronary artery without angina pectoris: Secondary | ICD-10-CM | POA: Diagnosis not present

## 2022-11-22 DIAGNOSIS — Z7982 Long term (current) use of aspirin: Secondary | ICD-10-CM | POA: Diagnosis not present

## 2022-11-22 DIAGNOSIS — E785 Hyperlipidemia, unspecified: Secondary | ICD-10-CM | POA: Diagnosis not present

## 2022-11-22 DIAGNOSIS — N184 Chronic kidney disease, stage 4 (severe): Secondary | ICD-10-CM | POA: Diagnosis not present

## 2022-11-22 DIAGNOSIS — L97528 Non-pressure chronic ulcer of other part of left foot with other specified severity: Secondary | ICD-10-CM | POA: Diagnosis not present

## 2022-11-22 DIAGNOSIS — E43 Unspecified severe protein-calorie malnutrition: Secondary | ICD-10-CM | POA: Diagnosis not present

## 2022-11-22 DIAGNOSIS — E1165 Type 2 diabetes mellitus with hyperglycemia: Secondary | ICD-10-CM | POA: Diagnosis not present

## 2022-11-22 DIAGNOSIS — I152 Hypertension secondary to endocrine disorders: Secondary | ICD-10-CM | POA: Diagnosis not present

## 2022-11-22 DIAGNOSIS — J449 Chronic obstructive pulmonary disease, unspecified: Secondary | ICD-10-CM | POA: Diagnosis not present

## 2022-11-22 DIAGNOSIS — M19072 Primary osteoarthritis, left ankle and foot: Secondary | ICD-10-CM | POA: Diagnosis not present

## 2022-11-22 DIAGNOSIS — Z7984 Long term (current) use of oral hypoglycemic drugs: Secondary | ICD-10-CM | POA: Diagnosis not present

## 2022-11-22 DIAGNOSIS — E1159 Type 2 diabetes mellitus with other circulatory complications: Secondary | ICD-10-CM | POA: Diagnosis not present

## 2022-11-22 DIAGNOSIS — Z794 Long term (current) use of insulin: Secondary | ICD-10-CM | POA: Diagnosis not present

## 2022-11-22 DIAGNOSIS — I5032 Chronic diastolic (congestive) heart failure: Secondary | ICD-10-CM | POA: Diagnosis not present

## 2022-11-22 DIAGNOSIS — L97522 Non-pressure chronic ulcer of other part of left foot with fat layer exposed: Secondary | ICD-10-CM | POA: Diagnosis not present

## 2022-11-22 DIAGNOSIS — E11621 Type 2 diabetes mellitus with foot ulcer: Secondary | ICD-10-CM | POA: Diagnosis not present

## 2022-11-22 DIAGNOSIS — M869 Osteomyelitis, unspecified: Secondary | ICD-10-CM | POA: Diagnosis not present

## 2022-11-22 DIAGNOSIS — E1122 Type 2 diabetes mellitus with diabetic chronic kidney disease: Secondary | ICD-10-CM | POA: Diagnosis not present

## 2022-11-22 NOTE — Telephone Encounter (Signed)
Called and spoke with April ([employer]),is good with only a verbal for approval to see a Education officer, museum and physical therapy. She said that she will put it in for patient.

## 2022-11-23 ENCOUNTER — Encounter (HOSPITAL_COMMUNITY): Admission: RE | Payer: Self-pay | Source: Home / Self Care

## 2022-11-23 ENCOUNTER — Telehealth: Payer: Self-pay

## 2022-11-23 ENCOUNTER — Ambulatory Visit (HOSPITAL_COMMUNITY): Admission: RE | Admit: 2022-11-23 | Payer: Medicare Other | Source: Home / Self Care | Admitting: Gastroenterology

## 2022-11-23 SURGERY — COLONOSCOPY WITH PROPOFOL
Anesthesia: Monitor Anesthesia Care

## 2022-11-23 NOTE — Telephone Encounter (Signed)
Pt contacted office to advise he is currently taking 5 units of Lantus BID, Farxiga 10 mg 1X daily, and Tradjenta 5 mg daily. Pt is currently homeless and living out of a hotel and can not find his receiver charger to check his blood sugar. He could not give blood sugar readings on new regimen.

## 2022-11-23 NOTE — Telephone Encounter (Signed)
Call from [place] to advise pt was recently d/c from the hospital due to a foot infection and his diabetic mediations had changed significantly. From what he could get from the pt he has not been able to check his blood sugars. He did not have Jolly supplies. His d/c instructions included stopping Ozempic and Humalog and now starting on Tradjenta. Was not sure how much Lantus pt is currently taking. Attempted to contact pt for more information and left a detailed message for pt to call back.

## 2022-11-23 NOTE — Telephone Encounter (Signed)
Oh, I am sorry to hear that.  For now, we cannot make medication changes without blood sugars.  Can he switch to checking his sugars with his phone?

## 2022-11-24 DIAGNOSIS — Z792 Long term (current) use of antibiotics: Secondary | ICD-10-CM | POA: Diagnosis not present

## 2022-11-24 DIAGNOSIS — E1122 Type 2 diabetes mellitus with diabetic chronic kidney disease: Secondary | ICD-10-CM | POA: Diagnosis not present

## 2022-11-24 DIAGNOSIS — Z7982 Long term (current) use of aspirin: Secondary | ICD-10-CM | POA: Diagnosis not present

## 2022-11-24 DIAGNOSIS — E113493 Type 2 diabetes mellitus with severe nonproliferative diabetic retinopathy without macular edema, bilateral: Secondary | ICD-10-CM | POA: Diagnosis not present

## 2022-11-24 DIAGNOSIS — E1165 Type 2 diabetes mellitus with hyperglycemia: Secondary | ICD-10-CM | POA: Diagnosis not present

## 2022-11-24 DIAGNOSIS — K219 Gastro-esophageal reflux disease without esophagitis: Secondary | ICD-10-CM | POA: Diagnosis not present

## 2022-11-24 DIAGNOSIS — J449 Chronic obstructive pulmonary disease, unspecified: Secondary | ICD-10-CM | POA: Diagnosis not present

## 2022-11-24 DIAGNOSIS — I10 Essential (primary) hypertension: Secondary | ICD-10-CM | POA: Diagnosis not present

## 2022-11-24 DIAGNOSIS — M19072 Primary osteoarthritis, left ankle and foot: Secondary | ICD-10-CM | POA: Diagnosis not present

## 2022-11-24 DIAGNOSIS — N184 Chronic kidney disease, stage 4 (severe): Secondary | ICD-10-CM | POA: Diagnosis not present

## 2022-11-24 DIAGNOSIS — M869 Osteomyelitis, unspecified: Secondary | ICD-10-CM | POA: Diagnosis not present

## 2022-11-24 DIAGNOSIS — E1140 Type 2 diabetes mellitus with diabetic neuropathy, unspecified: Secondary | ICD-10-CM | POA: Diagnosis not present

## 2022-11-24 DIAGNOSIS — E11621 Type 2 diabetes mellitus with foot ulcer: Secondary | ICD-10-CM | POA: Diagnosis not present

## 2022-11-24 DIAGNOSIS — E1169 Type 2 diabetes mellitus with other specified complication: Secondary | ICD-10-CM | POA: Diagnosis not present

## 2022-11-24 DIAGNOSIS — E1159 Type 2 diabetes mellitus with other circulatory complications: Secondary | ICD-10-CM | POA: Diagnosis not present

## 2022-11-24 DIAGNOSIS — I5022 Chronic systolic (congestive) heart failure: Secondary | ICD-10-CM | POA: Diagnosis not present

## 2022-11-24 DIAGNOSIS — I5032 Chronic diastolic (congestive) heart failure: Secondary | ICD-10-CM | POA: Diagnosis not present

## 2022-11-24 DIAGNOSIS — L97528 Non-pressure chronic ulcer of other part of left foot with other specified severity: Secondary | ICD-10-CM | POA: Diagnosis not present

## 2022-11-24 DIAGNOSIS — L03116 Cellulitis of left lower limb: Secondary | ICD-10-CM | POA: Diagnosis not present

## 2022-11-24 DIAGNOSIS — I152 Hypertension secondary to endocrine disorders: Secondary | ICD-10-CM | POA: Diagnosis not present

## 2022-11-24 DIAGNOSIS — E43 Unspecified severe protein-calorie malnutrition: Secondary | ICD-10-CM | POA: Diagnosis not present

## 2022-11-24 DIAGNOSIS — B964 Proteus (mirabilis) (morganii) as the cause of diseases classified elsewhere: Secondary | ICD-10-CM | POA: Diagnosis not present

## 2022-11-24 DIAGNOSIS — Z794 Long term (current) use of insulin: Secondary | ICD-10-CM | POA: Diagnosis not present

## 2022-11-24 DIAGNOSIS — Z7984 Long term (current) use of oral hypoglycemic drugs: Secondary | ICD-10-CM | POA: Diagnosis not present

## 2022-11-24 DIAGNOSIS — I2510 Atherosclerotic heart disease of native coronary artery without angina pectoris: Secondary | ICD-10-CM | POA: Diagnosis not present

## 2022-11-24 DIAGNOSIS — E785 Hyperlipidemia, unspecified: Secondary | ICD-10-CM | POA: Diagnosis not present

## 2022-11-24 DIAGNOSIS — Z89431 Acquired absence of right foot: Secondary | ICD-10-CM | POA: Diagnosis not present

## 2022-11-25 ENCOUNTER — Ambulatory Visit (INDEPENDENT_AMBULATORY_CARE_PROVIDER_SITE_OTHER): Payer: Medicare Other | Admitting: Podiatry

## 2022-11-25 ENCOUNTER — Inpatient Hospital Stay (HOSPITAL_COMMUNITY)
Admission: EM | Admit: 2022-11-25 | Discharge: 2022-12-09 | DRG: 464 | Disposition: A | Discharge status: Skilled Nursing Facility | Payer: Medicare Other | Attending: Family Medicine | Admitting: Family Medicine

## 2022-11-25 DIAGNOSIS — M869 Osteomyelitis, unspecified: Secondary | ICD-10-CM | POA: Diagnosis not present

## 2022-11-25 DIAGNOSIS — B965 Pseudomonas (aeruginosa) (mallei) (pseudomallei) as the cause of diseases classified elsewhere: Secondary | ICD-10-CM | POA: Diagnosis present

## 2022-11-25 DIAGNOSIS — I10 Essential (primary) hypertension: Secondary | ICD-10-CM | POA: Diagnosis present

## 2022-11-25 DIAGNOSIS — E1169 Type 2 diabetes mellitus with other specified complication: Secondary | ICD-10-CM | POA: Diagnosis not present

## 2022-11-25 DIAGNOSIS — I130 Hypertensive heart and chronic kidney disease with heart failure and stage 1 through stage 4 chronic kidney disease, or unspecified chronic kidney disease: Secondary | ICD-10-CM | POA: Diagnosis present

## 2022-11-25 DIAGNOSIS — E1142 Type 2 diabetes mellitus with diabetic polyneuropathy: Secondary | ICD-10-CM | POA: Diagnosis present

## 2022-11-25 DIAGNOSIS — Z833 Family history of diabetes mellitus: Secondary | ICD-10-CM

## 2022-11-25 DIAGNOSIS — Z23 Encounter for immunization: Secondary | ICD-10-CM

## 2022-11-25 DIAGNOSIS — L97529 Non-pressure chronic ulcer of other part of left foot with unspecified severity: Secondary | ICD-10-CM | POA: Diagnosis present

## 2022-11-25 DIAGNOSIS — D631 Anemia in chronic kidney disease: Secondary | ICD-10-CM | POA: Diagnosis not present

## 2022-11-25 DIAGNOSIS — Z6841 Body Mass Index (BMI) 40.0 and over, adult: Secondary | ICD-10-CM

## 2022-11-25 DIAGNOSIS — E1165 Type 2 diabetes mellitus with hyperglycemia: Secondary | ICD-10-CM | POA: Diagnosis present

## 2022-11-25 DIAGNOSIS — G4733 Obstructive sleep apnea (adult) (pediatric): Secondary | ICD-10-CM | POA: Diagnosis present

## 2022-11-25 DIAGNOSIS — E11621 Type 2 diabetes mellitus with foot ulcer: Secondary | ICD-10-CM | POA: Diagnosis not present

## 2022-11-25 DIAGNOSIS — E1151 Type 2 diabetes mellitus with diabetic peripheral angiopathy without gangrene: Secondary | ICD-10-CM | POA: Diagnosis not present

## 2022-11-25 DIAGNOSIS — R079 Chest pain, unspecified: Secondary | ICD-10-CM | POA: Diagnosis present

## 2022-11-25 DIAGNOSIS — I25119 Atherosclerotic heart disease of native coronary artery with unspecified angina pectoris: Secondary | ICD-10-CM | POA: Diagnosis not present

## 2022-11-25 DIAGNOSIS — I5032 Chronic diastolic (congestive) heart failure: Secondary | ICD-10-CM | POA: Diagnosis present

## 2022-11-25 DIAGNOSIS — K3184 Gastroparesis: Secondary | ICD-10-CM | POA: Diagnosis present

## 2022-11-25 DIAGNOSIS — N401 Enlarged prostate with lower urinary tract symptoms: Secondary | ICD-10-CM | POA: Diagnosis present

## 2022-11-25 DIAGNOSIS — E11628 Type 2 diabetes mellitus with other skin complications: Secondary | ICD-10-CM | POA: Diagnosis not present

## 2022-11-25 DIAGNOSIS — E119 Type 2 diabetes mellitus without complications: Secondary | ICD-10-CM

## 2022-11-25 DIAGNOSIS — I255 Ischemic cardiomyopathy: Secondary | ICD-10-CM | POA: Diagnosis present

## 2022-11-25 DIAGNOSIS — E785 Hyperlipidemia, unspecified: Secondary | ICD-10-CM | POA: Diagnosis not present

## 2022-11-25 DIAGNOSIS — I252 Old myocardial infarction: Secondary | ICD-10-CM

## 2022-11-25 DIAGNOSIS — M86172 Other acute osteomyelitis, left ankle and foot: Principal | ICD-10-CM | POA: Diagnosis present

## 2022-11-25 DIAGNOSIS — R55 Syncope and collapse: Secondary | ICD-10-CM | POA: Diagnosis not present

## 2022-11-25 DIAGNOSIS — Z79899 Other long term (current) drug therapy: Secondary | ICD-10-CM

## 2022-11-25 DIAGNOSIS — S91302A Unspecified open wound, left foot, initial encounter: Secondary | ICD-10-CM | POA: Diagnosis not present

## 2022-11-25 DIAGNOSIS — Z955 Presence of coronary angioplasty implant and graft: Secondary | ICD-10-CM

## 2022-11-25 DIAGNOSIS — J449 Chronic obstructive pulmonary disease, unspecified: Secondary | ICD-10-CM | POA: Diagnosis not present

## 2022-11-25 DIAGNOSIS — Z01810 Encounter for preprocedural cardiovascular examination: Secondary | ICD-10-CM

## 2022-11-25 DIAGNOSIS — R3916 Straining to void: Secondary | ICD-10-CM | POA: Diagnosis present

## 2022-11-25 DIAGNOSIS — S81802A Unspecified open wound, left lower leg, initial encounter: Secondary | ICD-10-CM | POA: Diagnosis not present

## 2022-11-25 DIAGNOSIS — E1143 Type 2 diabetes mellitus with diabetic autonomic (poly)neuropathy: Secondary | ICD-10-CM | POA: Diagnosis present

## 2022-11-25 DIAGNOSIS — Z8249 Family history of ischemic heart disease and other diseases of the circulatory system: Secondary | ICD-10-CM

## 2022-11-25 DIAGNOSIS — L97424 Non-pressure chronic ulcer of left heel and midfoot with necrosis of bone: Secondary | ICD-10-CM

## 2022-11-25 DIAGNOSIS — I70202 Unspecified atherosclerosis of native arteries of extremities, left leg: Secondary | ICD-10-CM | POA: Diagnosis not present

## 2022-11-25 DIAGNOSIS — M1990 Unspecified osteoarthritis, unspecified site: Secondary | ICD-10-CM | POA: Diagnosis present

## 2022-11-25 DIAGNOSIS — M545 Low back pain, unspecified: Secondary | ICD-10-CM | POA: Diagnosis present

## 2022-11-25 DIAGNOSIS — Z794 Long term (current) use of insulin: Secondary | ICD-10-CM | POA: Diagnosis not present

## 2022-11-25 DIAGNOSIS — E11649 Type 2 diabetes mellitus with hypoglycemia without coma: Secondary | ICD-10-CM | POA: Diagnosis not present

## 2022-11-25 DIAGNOSIS — R310 Gross hematuria: Secondary | ICD-10-CM | POA: Diagnosis not present

## 2022-11-25 DIAGNOSIS — L97509 Non-pressure chronic ulcer of other part of unspecified foot with unspecified severity: Secondary | ICD-10-CM | POA: Diagnosis not present

## 2022-11-25 DIAGNOSIS — I440 Atrioventricular block, first degree: Secondary | ICD-10-CM | POA: Diagnosis present

## 2022-11-25 DIAGNOSIS — Z8711 Personal history of peptic ulcer disease: Secondary | ICD-10-CM

## 2022-11-25 DIAGNOSIS — M109 Gout, unspecified: Secondary | ICD-10-CM | POA: Diagnosis present

## 2022-11-25 DIAGNOSIS — I70222 Atherosclerosis of native arteries of extremities with rest pain, left leg: Secondary | ICD-10-CM | POA: Diagnosis not present

## 2022-11-25 DIAGNOSIS — I70245 Atherosclerosis of native arteries of left leg with ulceration of other part of foot: Secondary | ICD-10-CM | POA: Diagnosis not present

## 2022-11-25 DIAGNOSIS — Z7902 Long term (current) use of antithrombotics/antiplatelets: Secondary | ICD-10-CM

## 2022-11-25 DIAGNOSIS — N1832 Chronic kidney disease, stage 3b: Secondary | ICD-10-CM | POA: Diagnosis not present

## 2022-11-25 DIAGNOSIS — Z888 Allergy status to other drugs, medicaments and biological substances status: Secondary | ICD-10-CM

## 2022-11-25 DIAGNOSIS — K219 Gastro-esophageal reflux disease without esophagitis: Secondary | ICD-10-CM | POA: Diagnosis present

## 2022-11-25 DIAGNOSIS — I5042 Chronic combined systolic (congestive) and diastolic (congestive) heart failure: Secondary | ICD-10-CM | POA: Diagnosis not present

## 2022-11-25 DIAGNOSIS — I2510 Atherosclerotic heart disease of native coronary artery without angina pectoris: Secondary | ICD-10-CM | POA: Diagnosis present

## 2022-11-25 DIAGNOSIS — E08621 Diabetes mellitus due to underlying condition with foot ulcer: Secondary | ICD-10-CM

## 2022-11-25 DIAGNOSIS — G8929 Other chronic pain: Secondary | ICD-10-CM | POA: Diagnosis present

## 2022-11-25 DIAGNOSIS — E1122 Type 2 diabetes mellitus with diabetic chronic kidney disease: Secondary | ICD-10-CM | POA: Diagnosis present

## 2022-11-25 DIAGNOSIS — E782 Mixed hyperlipidemia: Secondary | ICD-10-CM | POA: Diagnosis not present

## 2022-11-25 DIAGNOSIS — M8618 Other acute osteomyelitis, other site: Secondary | ICD-10-CM | POA: Diagnosis not present

## 2022-11-25 DIAGNOSIS — Z7984 Long term (current) use of oral hypoglycemic drugs: Secondary | ICD-10-CM

## 2022-11-25 DIAGNOSIS — R4182 Altered mental status, unspecified: Secondary | ICD-10-CM | POA: Diagnosis not present

## 2022-11-25 DIAGNOSIS — L089 Local infection of the skin and subcutaneous tissue, unspecified: Secondary | ICD-10-CM | POA: Diagnosis present

## 2022-11-25 DIAGNOSIS — Z7982 Long term (current) use of aspirin: Secondary | ICD-10-CM

## 2022-11-25 LAB — CBC WITH DIFFERENTIAL/PLATELET
Abs Immature Granulocytes: 0.16 10*3/uL — ABNORMAL HIGH (ref 0.00–0.07)
Basophils Absolute: 0 10*3/uL (ref 0.0–0.1)
Basophils Relative: 0 %
Eosinophils Absolute: 0 10*3/uL (ref 0.0–0.5)
Eosinophils Relative: 0 %
HCT: 39.1 % (ref 39.0–52.0)
Hemoglobin: 12.1 g/dL — ABNORMAL LOW (ref 13.0–17.0)
Immature Granulocytes: 1 %
Lymphocytes Relative: 14 %
Lymphs Abs: 1.6 10*3/uL (ref 0.7–4.0)
MCH: 27.7 pg (ref 26.0–34.0)
MCHC: 30.9 g/dL (ref 30.0–36.0)
MCV: 89.5 fL (ref 80.0–100.0)
Monocytes Absolute: 1.2 10*3/uL — ABNORMAL HIGH (ref 0.1–1.0)
Monocytes Relative: 11 %
Neutro Abs: 8.5 10*3/uL — ABNORMAL HIGH (ref 1.7–7.7)
Neutrophils Relative %: 74 %
Platelets: 312 10*3/uL (ref 150–400)
RBC: 4.37 MIL/uL (ref 4.22–5.81)
RDW: 18.6 % — ABNORMAL HIGH (ref 11.5–15.5)
WBC: 11.6 10*3/uL — ABNORMAL HIGH (ref 4.0–10.5)
nRBC: 0 % (ref 0.0–0.2)

## 2022-11-25 LAB — BASIC METABOLIC PANEL
Anion gap: 10 (ref 5–15)
BUN: 45 mg/dL — ABNORMAL HIGH (ref 8–23)
CO2: 22 mmol/L (ref 22–32)
Calcium: 9 mg/dL (ref 8.9–10.3)
Chloride: 106 mmol/L (ref 98–111)
Creatinine, Ser: 2.3 mg/dL — ABNORMAL HIGH (ref 0.61–1.24)
GFR, Estimated: 31 mL/min — ABNORMAL LOW (ref >60)
Glucose, Bld: 154 mg/dL — ABNORMAL HIGH (ref 70–99)
Potassium: 4.1 mmol/L (ref 3.5–5.1)
Sodium: 138 mmol/L (ref 135–145)

## 2022-11-25 NOTE — ED Triage Notes (Signed)
Patient states he was referred to ED for evaluation of left leg wound with possible plan for partial amputation of his left leg. PAtient is alert, oriented, and in no apparent distress at this time.

## 2022-11-25 NOTE — Progress Notes (Signed)
  Subjective:  Patient ID: Joshua [name], male    DOB: [birth year],  MRN: [id number]  Chief Complaint  Patient presents with   Wound Check    IRRIGATION AND DEBRIDEMENT WOUND;LEFT    DOS: 11/07/22 Procedure: Left lateral midfoot ulceration to level of bone, 4x4 cm, bone biopsy 5th met base, application integra graft and NPWT  66 y.o. male returns for post-op check.  Patient reports he has been doing all right since being discharged from the hospital he has been staying in a hotel room.  Has been having approximately 2-3 times a week wound VAC changes.  Says there have been wound VAC issues including a coming loose and detached.  He is still taking antibiotics at this time.  Patient has stated he has been having some chills but denies fever or nausea  Review of Systems: Negative except as noted in the HPI. Denies N/V/F   Objective:  There were no vitals filed for this visit. There is no height or weight on file to calculate BMI. Constitutional Well developed. Well nourished.  Vascular Diminished pedal pulses. Calf is soft and supple, no posterior calf or knee pain, negative Homans' sign  Neurologic Normal speech. Oriented to person, place, and time. Epicritic sensation to light touch grossly present bilaterally.  Dermatologic Large lateral midfoot ulceration at the 5th met base area. There is significant necrotic and fibrotic tissue present. Foul odor is present. No active purulent drainage. No appreciable improvement in the wound with graft/vac.   Orthopedic: Tenderness to palpation noted about the surgical site.   Multiple view plain film radiographs: Deferred at this visit Assessment:   1. Ulcer of left midfoot with necrosis of bone ([place])    Plan:  Patient was evaluated and treated and all questions answered.  S/p left foot wound debridement graft and VAC application -No appreciable healing occurring with wound vac/ graft.  -Unfortunately I do not believe that we will be  able to achieve substantial wound healing related to multiple factors primarily underlying osteomyelitis of the fifth metatarsal base despite antibiotic therapy. -Patient is also additionally already developing a varus foot deformity related to loss of the peroneus brevis tendon which will further prevent achieving reasonable wound healing. -Therefore I am recommending the patient proceed to the emergency department at [facility] for admission and evaluation for possible proximal amputation of the left lower extremity -Recommend admission and consultation to vascular surgery for evaluation for possible left leg amputation. -Spoke with emergency department PA Evlyn Courier at the time of this note writing to convey the above. -Foot redressed with Betadine gauze dressing.  Recommend Betadine and gauze dressing changes daily.         Everitt Amber, DPM [facility]

## 2022-11-25 NOTE — ED Provider Triage Note (Addendum)
Emergency Medicine Provider Triage Evaluation Note  Joshua [name] , a 66 y.o. male  was evaluated in triage.  Pt presents from podiatry office for potential amputation.  States his wound is not healing as expected.  Denies fever, chills, drainage from the wound.  Is compliant with his home antibiotics.  Is taken doxycycline as well as Augmentin.  Review of Systems  Positive: As above Negative: As above  Physical Exam  There were no vitals taken for this visit. Gen:   Awake, no distress   Resp:  Normal effort  MSK:   Moves extremities without difficulty  Other:  1+ DP pulse present on the left.  Attached image below of the wound.    Medical Decision Making  Medically screening exam initiated at 2:50 PM.  Appropriate orders placed.  Joshua [name] was informed that the remainder of the evaluation will be completed by another provider, this initial triage assessment does not replace that evaluation, and the importance of remaining in the ED until their evaluation is complete.  1800: The podiatrist Dr. Loel Lofty sent me a secure chat to discuss this patient.  I gave him a call.  He stated he saw the patient in the office today and given the poor wound healing is planning to do a below the knee amputation.  He does not recommend repeat MRI given that we will not change the plan for management which is below the knee amputation given the poor wound healing.  He is concerned for an infection but states it is not severe enough that it would warrant antibiotics at this time.  He recommends admission to medical service when patient gets a room, and consult to vascular surgery in the morning.  States that vascular surgery does not necessarily need to see the patient tonight.    Evlyn Courier, PA-C 11/25/22 [place], [place], PA-C 11/25/22 [phone]

## 2022-11-26 ENCOUNTER — Other Ambulatory Visit: Payer: Self-pay

## 2022-11-26 ENCOUNTER — Encounter (HOSPITAL_COMMUNITY): Payer: Self-pay | Admitting: Internal Medicine

## 2022-11-26 ENCOUNTER — Inpatient Hospital Stay (HOSPITAL_COMMUNITY): Payer: Medicare Other

## 2022-11-26 DIAGNOSIS — E11628 Type 2 diabetes mellitus with other skin complications: Secondary | ICD-10-CM | POA: Diagnosis not present

## 2022-11-26 DIAGNOSIS — S91302A Unspecified open wound, left foot, initial encounter: Secondary | ICD-10-CM | POA: Diagnosis present

## 2022-11-26 DIAGNOSIS — Z01810 Encounter for preprocedural cardiovascular examination: Secondary | ICD-10-CM | POA: Diagnosis not present

## 2022-11-26 DIAGNOSIS — M79672 Pain in left foot: Secondary | ICD-10-CM | POA: Insufficient documentation

## 2022-11-26 DIAGNOSIS — J341 Cyst and mucocele of nose and nasal sinus: Secondary | ICD-10-CM | POA: Diagnosis not present

## 2022-11-26 DIAGNOSIS — E1121 Type 2 diabetes mellitus with diabetic nephropathy: Secondary | ICD-10-CM | POA: Diagnosis not present

## 2022-11-26 DIAGNOSIS — R2681 Unsteadiness on feet: Secondary | ICD-10-CM | POA: Diagnosis not present

## 2022-11-26 DIAGNOSIS — E1142 Type 2 diabetes mellitus with diabetic polyneuropathy: Secondary | ICD-10-CM

## 2022-11-26 DIAGNOSIS — I10 Essential (primary) hypertension: Secondary | ICD-10-CM | POA: Diagnosis not present

## 2022-11-26 DIAGNOSIS — M86172 Other acute osteomyelitis, left ankle and foot: Secondary | ICD-10-CM | POA: Diagnosis not present

## 2022-11-26 DIAGNOSIS — Z7984 Long term (current) use of oral hypoglycemic drugs: Secondary | ICD-10-CM | POA: Diagnosis not present

## 2022-11-26 DIAGNOSIS — R531 Weakness: Secondary | ICD-10-CM | POA: Diagnosis not present

## 2022-11-26 DIAGNOSIS — N183 Chronic kidney disease, stage 3 unspecified: Secondary | ICD-10-CM | POA: Diagnosis not present

## 2022-11-26 DIAGNOSIS — E1159 Type 2 diabetes mellitus with other circulatory complications: Secondary | ICD-10-CM | POA: Diagnosis not present

## 2022-11-26 DIAGNOSIS — L97529 Non-pressure chronic ulcer of other part of left foot with unspecified severity: Secondary | ICD-10-CM | POA: Diagnosis not present

## 2022-11-26 DIAGNOSIS — I25119 Atherosclerotic heart disease of native coronary artery with unspecified angina pectoris: Secondary | ICD-10-CM | POA: Diagnosis not present

## 2022-11-26 DIAGNOSIS — I5030 Unspecified diastolic (congestive) heart failure: Secondary | ICD-10-CM | POA: Diagnosis not present

## 2022-11-26 DIAGNOSIS — M7989 Other specified soft tissue disorders: Secondary | ICD-10-CM | POA: Diagnosis not present

## 2022-11-26 DIAGNOSIS — I70202 Unspecified atherosclerosis of native arteries of extremities, left leg: Secondary | ICD-10-CM | POA: Diagnosis not present

## 2022-11-26 DIAGNOSIS — Z23 Encounter for immunization: Secondary | ICD-10-CM | POA: Diagnosis not present

## 2022-11-26 DIAGNOSIS — L98499 Non-pressure chronic ulcer of skin of other sites with unspecified severity: Secondary | ICD-10-CM | POA: Diagnosis not present

## 2022-11-26 DIAGNOSIS — S81802A Unspecified open wound, left lower leg, initial encounter: Secondary | ICD-10-CM

## 2022-11-26 DIAGNOSIS — R55 Syncope and collapse: Secondary | ICD-10-CM | POA: Diagnosis not present

## 2022-11-26 DIAGNOSIS — Z743 Need for continuous supervision: Secondary | ICD-10-CM | POA: Diagnosis not present

## 2022-11-26 DIAGNOSIS — E11649 Type 2 diabetes mellitus with hypoglycemia without coma: Secondary | ICD-10-CM | POA: Diagnosis not present

## 2022-11-26 DIAGNOSIS — I6529 Occlusion and stenosis of unspecified carotid artery: Secondary | ICD-10-CM | POA: Diagnosis not present

## 2022-11-26 DIAGNOSIS — N1832 Chronic kidney disease, stage 3b: Secondary | ICD-10-CM | POA: Diagnosis not present

## 2022-11-26 DIAGNOSIS — Z7401 Bed confinement status: Secondary | ICD-10-CM | POA: Diagnosis not present

## 2022-11-26 DIAGNOSIS — E1169 Type 2 diabetes mellitus with other specified complication: Secondary | ICD-10-CM | POA: Diagnosis not present

## 2022-11-26 DIAGNOSIS — E782 Mixed hyperlipidemia: Secondary | ICD-10-CM | POA: Diagnosis not present

## 2022-11-26 DIAGNOSIS — R1310 Dysphagia, unspecified: Secondary | ICD-10-CM | POA: Diagnosis not present

## 2022-11-26 DIAGNOSIS — B965 Pseudomonas (aeruginosa) (mallei) (pseudomallei) as the cause of diseases classified elsewhere: Secondary | ICD-10-CM | POA: Diagnosis present

## 2022-11-26 DIAGNOSIS — M86672 Other chronic osteomyelitis, left ankle and foot: Secondary | ICD-10-CM

## 2022-11-26 DIAGNOSIS — I70222 Atherosclerosis of native arteries of extremities with rest pain, left leg: Secondary | ICD-10-CM | POA: Diagnosis not present

## 2022-11-26 DIAGNOSIS — J449 Chronic obstructive pulmonary disease, unspecified: Secondary | ICD-10-CM | POA: Diagnosis not present

## 2022-11-26 DIAGNOSIS — Z89421 Acquired absence of other right toe(s): Secondary | ICD-10-CM | POA: Diagnosis not present

## 2022-11-26 DIAGNOSIS — M79673 Pain in unspecified foot: Secondary | ICD-10-CM | POA: Diagnosis not present

## 2022-11-26 DIAGNOSIS — D631 Anemia in chronic kidney disease: Secondary | ICD-10-CM | POA: Diagnosis not present

## 2022-11-26 DIAGNOSIS — R4182 Altered mental status, unspecified: Secondary | ICD-10-CM | POA: Diagnosis not present

## 2022-11-26 DIAGNOSIS — I5032 Chronic diastolic (congestive) heart failure: Secondary | ICD-10-CM

## 2022-11-26 DIAGNOSIS — I739 Peripheral vascular disease, unspecified: Secondary | ICD-10-CM | POA: Diagnosis not present

## 2022-11-26 DIAGNOSIS — Z961 Presence of intraocular lens: Secondary | ICD-10-CM | POA: Diagnosis not present

## 2022-11-26 DIAGNOSIS — M6281 Muscle weakness (generalized): Secondary | ICD-10-CM | POA: Diagnosis not present

## 2022-11-26 DIAGNOSIS — L97509 Non-pressure chronic ulcer of other part of unspecified foot with unspecified severity: Secondary | ICD-10-CM | POA: Diagnosis not present

## 2022-11-26 DIAGNOSIS — I70245 Atherosclerosis of native arteries of left leg with ulceration of other part of foot: Secondary | ICD-10-CM | POA: Diagnosis not present

## 2022-11-26 DIAGNOSIS — E1122 Type 2 diabetes mellitus with diabetic chronic kidney disease: Secondary | ICD-10-CM | POA: Diagnosis not present

## 2022-11-26 DIAGNOSIS — M86372 Chronic multifocal osteomyelitis, left ankle and foot: Secondary | ICD-10-CM | POA: Diagnosis not present

## 2022-11-26 DIAGNOSIS — E11621 Type 2 diabetes mellitus with foot ulcer: Secondary | ICD-10-CM | POA: Diagnosis not present

## 2022-11-26 DIAGNOSIS — M109 Gout, unspecified: Secondary | ICD-10-CM | POA: Diagnosis present

## 2022-11-26 DIAGNOSIS — I5042 Chronic combined systolic (congestive) and diastolic (congestive) heart failure: Secondary | ICD-10-CM | POA: Diagnosis not present

## 2022-11-26 DIAGNOSIS — S91331A Puncture wound without foreign body, right foot, initial encounter: Secondary | ICD-10-CM | POA: Diagnosis not present

## 2022-11-26 DIAGNOSIS — Z794 Long term (current) use of insulin: Secondary | ICD-10-CM

## 2022-11-26 DIAGNOSIS — E785 Hyperlipidemia, unspecified: Secondary | ICD-10-CM | POA: Diagnosis not present

## 2022-11-26 DIAGNOSIS — R509 Fever, unspecified: Secondary | ICD-10-CM | POA: Diagnosis not present

## 2022-11-26 DIAGNOSIS — I130 Hypertensive heart and chronic kidney disease with heart failure and stage 1 through stage 4 chronic kidney disease, or unspecified chronic kidney disease: Secondary | ICD-10-CM | POA: Diagnosis not present

## 2022-11-26 DIAGNOSIS — Z6841 Body Mass Index (BMI) 40.0 and over, adult: Secondary | ICD-10-CM | POA: Diagnosis not present

## 2022-11-26 DIAGNOSIS — L089 Local infection of the skin and subcutaneous tissue, unspecified: Secondary | ICD-10-CM | POA: Diagnosis not present

## 2022-11-26 DIAGNOSIS — M869 Osteomyelitis, unspecified: Secondary | ICD-10-CM | POA: Diagnosis not present

## 2022-11-26 DIAGNOSIS — L97424 Non-pressure chronic ulcer of left heel and midfoot with necrosis of bone: Secondary | ICD-10-CM

## 2022-11-26 DIAGNOSIS — I2510 Atherosclerotic heart disease of native coronary artery without angina pectoris: Secondary | ICD-10-CM | POA: Diagnosis not present

## 2022-11-26 DIAGNOSIS — G4730 Sleep apnea, unspecified: Secondary | ICD-10-CM | POA: Diagnosis not present

## 2022-11-26 DIAGNOSIS — I252 Old myocardial infarction: Secondary | ICD-10-CM | POA: Diagnosis not present

## 2022-11-26 DIAGNOSIS — Z79899 Other long term (current) drug therapy: Secondary | ICD-10-CM | POA: Diagnosis not present

## 2022-11-26 DIAGNOSIS — M8618 Other acute osteomyelitis, other site: Secondary | ICD-10-CM | POA: Diagnosis not present

## 2022-11-26 DIAGNOSIS — E1143 Type 2 diabetes mellitus with diabetic autonomic (poly)neuropathy: Secondary | ICD-10-CM | POA: Diagnosis present

## 2022-11-26 DIAGNOSIS — E1151 Type 2 diabetes mellitus with diabetic peripheral angiopathy without gangrene: Secondary | ICD-10-CM | POA: Diagnosis present

## 2022-11-26 DIAGNOSIS — R079 Chest pain, unspecified: Secondary | ICD-10-CM | POA: Diagnosis not present

## 2022-11-26 DIAGNOSIS — E1165 Type 2 diabetes mellitus with hyperglycemia: Secondary | ICD-10-CM | POA: Diagnosis present

## 2022-11-26 LAB — CBG MONITORING, ED: Glucose-Capillary: 145 mg/dL — ABNORMAL HIGH (ref 70–99)

## 2022-11-26 LAB — COMPREHENSIVE METABOLIC PANEL
ALT: 16 U/L (ref 0–44)
AST: 21 U/L (ref 15–41)
Albumin: 2.4 g/dL — ABNORMAL LOW (ref 3.5–5.0)
Alkaline Phosphatase: 130 U/L — ABNORMAL HIGH (ref 38–126)
Anion gap: 12 (ref 5–15)
BUN: 50 mg/dL — ABNORMAL HIGH (ref 8–23)
CO2: 21 mmol/L — ABNORMAL LOW (ref 22–32)
Calcium: 9 mg/dL (ref 8.9–10.3)
Chloride: 104 mmol/L (ref 98–111)
Creatinine, Ser: 2.48 mg/dL — ABNORMAL HIGH (ref 0.61–1.24)
GFR, Estimated: 28 mL/min — ABNORMAL LOW (ref >60)
Glucose, Bld: 166 mg/dL — ABNORMAL HIGH (ref 70–99)
Potassium: 3.7 mmol/L (ref 3.5–5.1)
Sodium: 137 mmol/L (ref 135–145)
Total Bilirubin: 0.5 mg/dL (ref 0.3–1.2)
Total Protein: 7.6 g/dL (ref 6.5–8.1)

## 2022-11-26 LAB — CBC WITH DIFFERENTIAL/PLATELET
Abs Immature Granulocytes: 0.14 10*3/uL — ABNORMAL HIGH (ref 0.00–0.07)
Basophils Absolute: 0 10*3/uL (ref 0.0–0.1)
Basophils Relative: 0 %
Eosinophils Absolute: 0 10*3/uL (ref 0.0–0.5)
Eosinophils Relative: 0 %
HCT: 36.5 % — ABNORMAL LOW (ref 39.0–52.0)
Hemoglobin: 11.7 g/dL — ABNORMAL LOW (ref 13.0–17.0)
Immature Granulocytes: 1 %
Lymphocytes Relative: 16 %
Lymphs Abs: 1.6 10*3/uL (ref 0.7–4.0)
MCH: 27.9 pg (ref 26.0–34.0)
MCHC: 32.1 g/dL (ref 30.0–36.0)
MCV: 86.9 fL (ref 80.0–100.0)
Monocytes Absolute: 1.1 10*3/uL — ABNORMAL HIGH (ref 0.1–1.0)
Monocytes Relative: 11 %
Neutro Abs: 7.1 10*3/uL (ref 1.7–7.7)
Neutrophils Relative %: 72 %
Platelets: 281 10*3/uL (ref 150–400)
RBC: 4.2 MIL/uL — ABNORMAL LOW (ref 4.22–5.81)
RDW: 18.6 % — ABNORMAL HIGH (ref 11.5–15.5)
WBC: 9.9 10*3/uL (ref 4.0–10.5)
nRBC: 0 % (ref 0.0–0.2)

## 2022-11-26 LAB — MAGNESIUM: Magnesium: 2.4 mg/dL (ref 1.7–2.4)

## 2022-11-26 LAB — PROTIME-INR
INR: 1.2 (ref 0.8–1.2)
Prothrombin Time: 15.1 seconds (ref 11.4–15.2)

## 2022-11-26 LAB — GLUCOSE, CAPILLARY
Glucose-Capillary: 175 mg/dL — ABNORMAL HIGH (ref 70–99)
Glucose-Capillary: 334 mg/dL — ABNORMAL HIGH (ref 70–99)

## 2022-11-26 MED ORDER — INSULIN ASPART 100 UNIT/ML IJ SOLN
0.0000 [IU] | Freq: Three times a day (TID) | INTRAMUSCULAR | Status: DC
  Administered 2022-11-27: 8 [IU] via SUBCUTANEOUS
  Administered 2022-11-27: 3 [IU] via SUBCUTANEOUS
  Administered 2022-11-28: 5 [IU] via SUBCUTANEOUS
  Administered 2022-11-28: 11 [IU] via SUBCUTANEOUS
  Administered 2022-11-28 – 2022-11-29 (×2): 5 [IU] via SUBCUTANEOUS
  Administered 2022-11-29: 3 [IU] via SUBCUTANEOUS
  Administered 2022-11-30 (×2): 2 [IU] via SUBCUTANEOUS
  Administered 2022-12-01 (×3): 3 [IU] via SUBCUTANEOUS

## 2022-11-26 MED ORDER — MELATONIN 3 MG PO TABS
3.0000 mg | ORAL_TABLET | Freq: Every evening | ORAL | Status: DC | PRN
  Administered 2022-11-30: 3 mg via ORAL
  Filled 2022-11-26 (×2): qty 1

## 2022-11-26 MED ORDER — METRONIDAZOLE 500 MG/100ML IV SOLN
500.0000 mg | Freq: Two times a day (BID) | INTRAVENOUS | Status: DC
  Administered 2022-11-26 – 2022-12-02 (×12): 500 mg via INTRAVENOUS
  Filled 2022-11-26 (×13): qty 100

## 2022-11-26 MED ORDER — ACETAMINOPHEN 325 MG PO TABS: 650.0000 mg | ORAL_TABLET | Freq: Four times a day (QID) | ORAL | Status: DC | PRN

## 2022-11-26 MED ORDER — NALOXONE HCL 0.4 MG/ML IJ SOLN: 0.4000 mg | INTRAMUSCULAR | Status: DC | PRN

## 2022-11-26 MED ORDER — ATORVASTATIN CALCIUM 80 MG PO TABS
80.0000 mg | ORAL_TABLET | Freq: Every day | ORAL | Status: DC
  Administered 2022-11-26 – 2022-12-09 (×14): 80 mg via ORAL
  Filled 2022-11-26 (×14): qty 1

## 2022-11-26 MED ORDER — OXYCODONE-ACETAMINOPHEN 5-325 MG PO TABS
2.0000 | ORAL_TABLET | Freq: Once | ORAL | Status: AC
  Administered 2022-11-26: 2 via ORAL
  Filled 2022-11-26: qty 2

## 2022-11-26 MED ORDER — LINEZOLID 600 MG/300ML IV SOLN
600.0000 mg | Freq: Two times a day (BID) | INTRAVENOUS | Status: DC
  Administered 2022-11-26 – 2022-12-06 (×21): 600 mg via INTRAVENOUS
  Filled 2022-11-26 (×25): qty 300

## 2022-11-26 MED ORDER — ACETAMINOPHEN 650 MG RE SUPP: 650.0000 mg | Freq: Four times a day (QID) | RECTAL | Status: DC | PRN

## 2022-11-26 MED ORDER — INSULIN ASPART 100 UNIT/ML IJ SOLN
0.0000 [IU] | Freq: Four times a day (QID) | INTRAMUSCULAR | Status: DC
  Administered 2022-11-26: 1 [IU] via SUBCUTANEOUS

## 2022-11-26 MED ORDER — INFLUENZA VAC SPLIT QUAD 0.5 ML IM SUSY
0.5000 mL | PREFILLED_SYRINGE | INTRAMUSCULAR | Status: AC
  Administered 2022-12-09: 0.5 mL via INTRAMUSCULAR
  Filled 2022-11-26: qty 0.5

## 2022-11-26 MED ORDER — SODIUM CHLORIDE 0.9 % IV SOLN
2.0000 g | INTRAVENOUS | Status: AC
  Administered 2022-11-26 – 2022-12-01 (×6): 2 g via INTRAVENOUS
  Filled 2022-11-26 (×7): qty 20

## 2022-11-26 MED ORDER — ONDANSETRON HCL 4 MG/2ML IJ SOLN
4.0000 mg | Freq: Four times a day (QID) | INTRAMUSCULAR | Status: DC | PRN
  Administered 2022-12-06: 4 mg via INTRAVENOUS

## 2022-11-26 MED ORDER — OXYCODONE-ACETAMINOPHEN 5-325 MG PO TABS
1.0000 | ORAL_TABLET | Freq: Four times a day (QID) | ORAL | Status: DC | PRN
  Administered 2022-11-27 – 2022-12-06 (×7): 2 via ORAL
  Filled 2022-11-26 (×8): qty 2

## 2022-11-26 MED ORDER — LACTATED RINGERS IV SOLN: INTRAVENOUS | Status: DC

## 2022-11-26 MED ORDER — INSULIN ASPART 100 UNIT/ML IJ SOLN
0.0000 [IU] | Freq: Every day | INTRAMUSCULAR | Status: DC
  Administered 2022-11-26: 4 [IU] via SUBCUTANEOUS
  Administered 2022-11-28 – 2022-11-29 (×2): 2 [IU] via SUBCUTANEOUS

## 2022-11-26 MED ORDER — METOPROLOL SUCCINATE ER 50 MG PO TB24
75.0000 mg | ORAL_TABLET | Freq: Every day | ORAL | Status: DC
  Administered 2022-11-26 – 2022-12-09 (×14): 75 mg via ORAL
  Filled 2022-11-26 (×14): qty 1

## 2022-11-26 MED ORDER — FENTANYL CITRATE PF 50 MCG/ML IJ SOSY: 50.0000 ug | PREFILLED_SYRINGE | INTRAMUSCULAR | Status: DC | PRN

## 2022-11-26 NOTE — Progress Notes (Signed)
Did not know this was a different patient that ED nurse called me about. already. The other patient that was on the board for 29 had tele orders which is why I said we needed to verify tele orders. I did not realize doctor had taken previous patient off the board already and ED nurse was calling about a this current patient. This patient does not have tele orders so we will purple man. Sorry for the mix up.

## 2022-11-26 NOTE — ED Notes (Signed)
Pt back from vascular study.

## 2022-11-26 NOTE — ED Provider Notes (Signed)
[facility] EMERGENCY DEPARTMENT Provider Note   CSN: [id number] Arrival date & time: 11/25/22  1339     History  Chief Complaint  Patient presents with   Wound Check    Joshua [name] is a 66 y.o. male.  The history is provided by the patient and medical records.  Wound Check   66 y.o. M with hx of HLP, CHF, CKD, obesity, DM2, HTN, obesity, osteomyelitis left foot, presneting to the ED from podiatry office for evaluation of non-healing ulcer to left foot.  Has been under the care of podiatry, Dr. Loel Lofty, for the past several weeks.  He has been staying a hotel for now, does not have permanent housing.  He has been taking doxycycline and augmentin but still having issues with wound not healing.  He denies fever/chills.  Dr. Leilani Merl recommended admission to medical service with vascular consultation in the morning for possible left BKA.  Home Medications Prior to Admission medications   Medication Sig Start Date End Date Taking? Authorizing Provider  allopurinol (ZYLOPRIM) 100 MG tablet Take 1 tablet (100 mg total) by mouth daily. 11/17/22   Aline August, MD  amoxicillin-clavulanate (AUGMENTIN) 875-125 MG tablet Take 1 tablet by mouth every 12 (twelve) hours. 11/16/22 12/16/22  Aline August, MD  ascorbic acid (VITAMIN C) 500 MG tablet Take 1 tablet (500 mg total) by mouth 2 (two) times daily. 11/16/22   Aline August, MD  aspirin EC 81 MG tablet Take 81 mg by mouth daily.     [provider]  atorvastatin (LIPITOR) 80 MG tablet Take 80 mg by mouth daily. 08/29/22   [provider]  Continuous Blood Gluc Receiver (FREESTYLE LIBRE 2 READER) [employer] See admin instructions. 11/23/22   [provider]  Continuous Blood Gluc Sensor (FREESTYLE LIBRE 2 SENSOR) MISC APPLY TOPICALLY EVERY 14 DAYS 11/01/22   Philemon Kingdom, MD  dapagliflozin propanediol (FARXIGA) 10 MG TABS tablet Take 10 mg by mouth daily.    [provider]   doxycycline (VIBRA-TABS) 100 MG tablet Take 1 tablet (100 mg total) by mouth every 12 (twelve) hours. 11/16/22 12/16/22  Aline August, MD  famotidine (PEPCID) 20 MG tablet Take 20 mg by mouth at bedtime. 11/27/20   [provider]  furosemide (LASIX) 80 MG tablet Take 0.5 tablets (40 mg total) by mouth daily. Take 1 tablet by mouth twice daily 11/16/22   Aline August, MD  gabapentin (NEURONTIN) 100 MG capsule Take 1 capsule by mouth at bedtime 10/05/22   Star Age, MD  insulin glargine (LANTUS SOLOSTAR) 100 UNIT/ML Solostar Pen Inject 5 Units into the skin 2 (two) times daily. 11/16/22   Aline August, MD  isosorbide mononitrate (IMDUR) 60 MG 24 hr tablet Take 3 tablets (180 mg total) by mouth daily. 04/06/22   [name], MD  latanoprost (XALATAN) 0.005 % ophthalmic solution Place 1 drop into both eyes at bedtime.    [provider]  Lidocaine HCl-Benzyl Alcohol (SALONPAS LIDOCAINE PLUS) 4-10 % CREA Apply 1 patch topically as needed (pain).    [provider]  linagliptin (TRADJENTA) 5 MG TABS tablet Take 1 tablet (5 mg total) by mouth daily. 11/17/22   Aline August, MD  metoprolol succinate (TOPROL-XL) 25 MG 24 hr tablet Take 3 tablets by mouth every day 09/30/22   [name], MD  nitroGLYCERIN (NITROSTAT) 0.4 MG SL tablet Place 1 tablet (0.4 mg total) under the tongue every 5 (five) minutes as needed for chest pain. 09/29/22  [name], MD  oxyCODONE-acetaminophen (PERCOCET/ROXICET) 5-325 MG tablet Take 1 tablet by mouth every 6 (six) hours as needed for moderate pain. 11/16/22   Aline August, MD  pantoprazole (PROTONIX) 40 MG tablet TAKE 1 TABLET BY MOUTH TWICE DAILY BEFORE MEAL(S) Patient taking differently: Take 40 mg by mouth 2 (two) times daily. 09/19/19   [name], MD  polyethylene glycol powder (GLYCOLAX/MIRALAX) 17 GM/SCOOP powder Take 1 capful  (17 g) by mouth daily as needed for moderate constipation. 11/16/22   Aline August, MD   senna-docusate (SENOKOT-S) 8.6-50 MG tablet Take 1 tablet by mouth 2 (two) times daily. 11/16/22   Aline August, MD  ticagrelor (BRILINTA) 90 MG TABS tablet Take 1 tablet by mouth twice daily Patient taking differently: Take 90 mg by mouth 2 (two) times daily. 09/30/22   [name], MD  Wheat Dextrin (BENEFIBER) CHEW Chew 1 tablet by mouth daily as needed (for constipation).    [provider]      Allergies    Chlorthalidone    Review of Systems   Review of Systems  Skin:  Positive for wound.  All other systems reviewed and are negative.   Physical Exam Updated Vital Signs BP 119/61 (BP Location: Left Arm)   Pulse 86   Temp 98.4 F (36.9 C)   Resp 15   SpO2 100%   Physical Exam Vitals and nursing note reviewed.  Constitutional:      Appearance: He is well-developed.  HENT:     Head: Normocephalic and atraumatic.  Eyes:     Conjunctiva/sclera: Conjunctivae normal.     Pupils: Pupils are equal, round, and reactive to light.  Cardiovascular:     Rate and Rhythm: Normal rate and regular rhythm.     Heart sounds: Normal heart sounds.  Pulmonary:     Effort: Pulmonary effort is normal.     Breath sounds: Normal breath sounds.  Abdominal:     General: Bowel sounds are normal.     Palpations: Abdomen is soft.  Musculoskeletal:        General: Normal range of motion.     Cervical back: Normal range of motion.     Comments: Ulcerated wound to left lateral foot, see photos below  Skin:    General: Skin is warm and dry.  Neurological:     Mental Status: He is alert and oriented to person, place, and time.     ED Results / Procedures / Treatments   Labs (all labs ordered are listed, but only abnormal results are displayed) Labs Reviewed  CBC WITH DIFFERENTIAL/PLATELET - Abnormal; Notable for the following components:      Result Value   WBC 11.6 (*)    Hemoglobin 12.1 (*)    RDW 18.6 (*)    Neutro Abs 8.5 (*)    Monocytes Absolute 1.2 (*)    Abs  Immature Granulocytes 0.16 (*)    All other components within normal limits  BASIC METABOLIC PANEL - Abnormal; Notable for the following components:   Glucose, Bld 154 (*)    BUN 45 (*)    Creatinine, Ser 2.30 (*)    GFR, Estimated 31 (*)    All other components within normal limits    EKG None  Radiology No results found.  Procedures Procedures    Medications Ordered in ED Medications - No data to display  ED Course/ Medical Decision Making/ A&P  Medical Decision Making Amount and/or Complexity of Data Reviewed ECG/medicine tests: ordered and independent interpretation performed.  Risk Prescription drug management. Decision regarding hospitalization.   66 year old male here with nonhealing wound to left lateral foot.  Has been under care of podiatry over the past several weeks.  He has not responded to dressing changes and antibiotics.  He is recommended for possible left BKA new.  He has not had any fevers.  Labs were obtained today-- WBC 11.6, SrCr 2.30 which appears around baseline.  Per Dr. Leilani Merl-- plan to medical admission, does not recommend abx for now, vascular consultation in the AM for possible amputation.  Discussed with Dr. Velia Meyer-- will admit for ongoing care.  Final Clinical Impression(s) / ED Diagnoses Final diagnoses:  Non-healing wound of left lower extremity    Rx / DC Orders ED Discharge Orders     None         Larene Pickett, PA-C 11/26/22 0316    Mesner, Corene Cornea, MD 11/26/22 [phone]

## 2022-11-26 NOTE — Progress Notes (Signed)
Patient has arrived to the floor  

## 2022-11-26 NOTE — ED Notes (Signed)
Chronic wound present on RLE. Patient waiting on evaluation by Vascular team for plan of care. Pt NPO at this time.

## 2022-11-26 NOTE — Progress Notes (Addendum)
PROGRESS NOTE    Joshua [name]  KAI:[id number] DOB: 1956-04-22 DOA: 11/25/2022 PCP: Elias Else, MD   Brief Narrative: 66 year old with past medical history significant for poorly healing wound of the left lateral foot, diabetes type 2 complicated by diabetic poorly neuropathy, chronic diastolic heart failure, CKD stage IIIb, creatinine baseline 2--2.4, hypertension, hyperlipidemia who presents with nonhealing wound of the left foot after refer for admission from podiatry clinic.  Dr. Sarita Bottom for podiatry is recommending left BKA.  Also patient will need vascular consultation.   Assessment & Plan:   Principal Problem:   Non-healing wound of left lower extremity Active Problems:   Hyperlipidemia   Chronic diastolic CHF (congestive heart failure) (HCC)   Stage 3b chronic kidney disease (CKD) (HCC)   DM2 (diabetes mellitus, type 2) (HCC)   Essential hypertension   1-Nonhealing wound of the lateral aspect of the left foot:, complicated by osteomyelitis: -Status postdebridement by podiatry on 11/06/2022. -He has been on oral antibiotics and wound VAC as an outpatient but wound continued  not improve. -ABI done in 11/21st/2023: -Podiatry is recommending left BKA. -Vascular recommend initiation of antibiotics.   2-Diabetes type 2: -Diabetic neuropathy -Recent A1c 7.6. -Continue sliding scale insulin -Holding basal insulin due to n.p.o. status. -hold Gabapentin  3-Chronic diastolic heart failure: Last echo 08/2021 ejection fraction 55 to 60% grade 2 diastolic dysfunction. Agree with holding Lasix due to n.p.o. status.  Monitor volume status.  CKD stage IIIb: Creatinine range 2.0--2.4  Hypertension:  PRN hydralazine.  Resume Metoprolol/  Hold imdur , resume tomorrow.   Hyperlipidemia: resume statins.   Vascular diseases:  ABI : 11/02/2022 ABI/TBIToday's ABIToday's TBIPrevious ABIPrevious TBI  +-------+-----------+-----------+------------+------------+  Right  1.28       amp.       0.90                      +-------+-----------+-----------+------------+------------+  Left  1.15       0.70       1.05     Vascular consulted.        Estimated body mass index is 40.17 kg/m as calculated from the following:   Height as of 11/18/22: 5\' 9"  (1.753 m).   Weight as of 11/18/22: 123.4 kg.   DVT prophylaxis: SCD Code Status: Full code Family Communication: care discussed with patient Disposition Plan:  Status is: Inpatient Remains inpatient appropriate because:     Consultants:  Vascular   Procedures:    Antimicrobials:    Subjective: He denies chest pain, dyspnea. He is aware about recommendation from podiatry about amputation.   Objective: Vitals:   11/25/22 1515 11/25/22 1838 11/26/22 0056 11/26/22 0501  BP: 137/64 (!) 166/79 119/61 124/62  Pulse: 92 82 86 86  Resp: 16 12 15 16   Temp: 98.7 F (37.1 C) 98.4 F (36.9 C) 98.4 F (36.9 C) 98.2 F (36.8 C)  TempSrc:  Oral  Oral  SpO2: 99% 100% 100% 99%   No intake or output data in the 24 hours ending 11/26/22 0746 There were no vitals filed for this visit.  Examination:  General exam: Appears calm and comfortable  Respiratory system: Clear to auscultation. Respiratory effort normal. Cardiovascular system: S1 & S2 heard, RRR. No JVD, murmurs, rubs, gallops or clicks. No pedal edema. Gastrointestinal system: Abdomen is nondistended, soft and nontender. No organomegaly or masses felt. Normal bowel sounds heard. Central nervous system: alert. Extremities: no edema, deformed left foot, dressing  Data Reviewed: I have personally reviewed  following labs and imaging studies  CBC: Recent Labs  Lab 11/25/22 1450 11/26/22 0411  WBC 11.6* 9.9  NEUTROABS 8.5* 7.1  HGB 12.1* 11.7*  HCT 39.1 36.5*  MCV 89.5 86.9  PLT 312 092   Basic Metabolic Panel: Recent Labs  Lab 11/25/22 1450 11/26/22 0411  NA 138 137  K 4.1 3.7  CL 106 104  CO2 22 21*  GLUCOSE 154*  166*  BUN 45* 50*  CREATININE 2.30* 2.48*  CALCIUM 9.0 9.0  MG  --  2.4   GFR: Estimated Creatinine Clearance: 39.1 mL/min (A) (by C-G formula based on SCr of 2.48 mg/dL (H)). Liver Function Tests: Recent Labs  Lab 11/26/22 0411  AST 21  ALT 16  ALKPHOS 130*  BILITOT 0.5  PROT 7.6  ALBUMIN 2.4*   No results for input(s): "LIPASE", "AMYLASE" in the last 168 hours. No results for input(s): "AMMONIA" in the last 168 hours. Coagulation Profile: Recent Labs  Lab 11/26/22 0411  INR 1.2   Cardiac Enzymes: No results for input(s): "CKTOTAL", "CKMB", "CKMBINDEX", "TROPONINI" in the last 168 hours. BNP (last 3 results) No results for input(s): "PROBNP" in the last 8760 hours. HbA1C: No results for input(s): "HGBA1C" in the last 72 hours. CBG: No results for input(s): "GLUCAP" in the last 168 hours. Lipid Profile: No results for input(s): "CHOL", "HDL", "LDLCALC", "TRIG", "CHOLHDL", "LDLDIRECT" in the last 72 hours. Thyroid Function Tests: No results for input(s): "TSH", "T4TOTAL", "FREET4", "T3FREE", "THYROIDAB" in the last 72 hours. Anemia Panel: No results for input(s): "VITAMINB12", "FOLATE", "FERRITIN", "TIBC", "IRON", "RETICCTPCT" in the last 72 hours. Sepsis Labs: No results for input(s): "PROCALCITON", "LATICACIDVEN" in the last 168 hours.  No results found for this or any previous visit (from the past 240 hour(s)).       Radiology Studies: No results found.      Scheduled Meds:  insulin aspart  0-9 Units Subcutaneous Q6H   Continuous Infusions:   LOS: 0 days    Time spent: 35 minutes    Curtiss Mahmood A Johna Kearl, MD [facility]   If 7PM-7AM, please contact night-coverage [email or web address]  11/26/2022, 7:46 AM

## 2022-11-26 NOTE — ED Notes (Signed)
Checked patient cbg it was 72 notified RN of blood sugar patient is resting with call bell in reach

## 2022-11-26 NOTE — ED Notes (Signed)
Patient ambulating to restroom with assist of a cane.

## 2022-11-26 NOTE — Consult Note (Signed)
PODIATRY CONSULTATION  NAME Joshua [name] MRN [id number] DOB May 12, 1956 DOA 11/25/2022   Reason for consult:  Left lateral foot ulceration  Attending/Consulting physician: Dr. Tyrell Antonio  History of present illness: 66 y.o. male With left lateral midfoot ucleration. Has been present for weeks to months. S/p Prior bone biopsy L 5th metatarsal base, debridement of ulceration, integra graft and wound vac on 11/06/22. Has been on PO abx. No improvement in ulceration and now appears re infected with further tissue necrosis. Was seen in office yesterday and directed to ED for vascular surgical evaluation and consideration for proximal limb amputation.   Past Medical History:  Diagnosis Date   Acute coronary syndrome (HCC)    Anemia    B12 deficiency    Chest pain    CHF (congestive heart failure) (HCC)    Chronic combined systolic and diastolic CHF (congestive heart failure) ([place])    a. 07/2015 Echo: EF 45-50%, mod LVH, mid apical/antsept AK, Gr 1 DD.   Chronic low back pain    CKD (chronic kidney disease), stage III (HCC)    Constipation    COPD (chronic obstructive pulmonary disease) (HCC)    Coronary artery disease    a. 2012 s/p MI/PCI: s/p DES to mid/dist RCA and overlapping DES to LAD;  b. 07/2015 Cath: LAD 10% ISR, D1 40(jailed), LCX 16m OM2 30, OM3 30, RCA 576mSR, 10d ISR; c. 07/2016 NSTEMI/PCI: LAD 85ost/p/m (3.5x20 Synergy DES overlapping prior stent), D1 40, LCX 7521mM2 95 (2.75x16 Synergy DES), OM3 30, RCA 32m2md.   Depression    Diabetic gastroparesis (HCC)[place] Diabetic polyneuropathy (HCC)[place] DKA (diabetic ketoacidoses) 03/14/2018   GERD (gastroesophageal reflux disease)    Gout    Heart failure (HCC)    Heart murmur    History of MI (myocardial infarction)    Hyperlipemia    Hypertension    Hypertensive heart disease    Hypokalemia    Ischemic cardiomyopathy    a. 07/2015 Echo: EF 45-50%, mod LVH, mid-apicalanteroseptal AK, Gr1 DD.   Joint pain    Kidney  problem    Morbid obesity (HCC)[place] OSA on CPAP    Osteoarthritis    Osteomyelitis (HCC)[place] a. 07/2016 MTP joint of R great toe s/p transmetatarsal amputation.   Other fatigue    Polyneuropathy in diabetes(357.2)    Pulmonary sarcoidosis (HCC)[place] "problems w/it years ago; no problems anymore" (07/03/2014)   Rectal bleeding    Shortness of breath    Shortness of breath on exertion    Sleep apnea    Stomach ulcer    Swallowing difficulty    Type II diabetes mellitus (HCC)[place]     Latest Ref Rng & Units 11/26/2022    4:11 AM 11/25/2022    2:50 PM 11/18/2022    4:05 PM  CBC  WBC 4.0 - 10.5 K/uL 9.9  11.6  12.5   Hemoglobin 13.0 - 17.0 g/dL 11.7  12.1  12.2   Hematocrit 39.0 - 52.0 % 36.5  39.1  37.3   Platelets 150 - 400 K/uL 281  312  274        Latest Ref Rng & Units 11/26/2022    4:11 AM 11/25/2022    2:50 PM 11/18/2022    4:05 PM  BMP  Glucose 70 - 99 mg/dL 166  154  183   BUN 8 - 23 mg/dL 50  45  48   Creatinine 0.61 - 1.24 mg/dL 2.48  2.30  2.11   BUN/Creat Ratio 6 - 22 (calc)   23   Sodium 135 - 145 mmol/L 137  138  137   Potassium 3.5 - 5.1 mmol/L 3.7  4.1  4.4   Chloride 98 - 111 mmol/L 104  106  103   CO2 22 - 32 mmol/L _0 Calcium 8.9 - 10.3 mg/dL 9.0  9.0  9.2       Physical Exam: Lower Extremity Exam Vasc: R - PT 1/4 palpable, DP 1/4 palpable. Cap refill < 3 sec to digits  L - PT 1/4 palpable, DP 1/4 palpable. Cap refill <3 sec to digits  Derm: R - Normal temp/texture/turgor with no open lesion or clinical signs of infection   L - Large lateral midfoot ulceration at the 5th met base area. There is significant necrotic and fibrotic tissue present. Foul odor is present. No active purulent drainage.     MSK:  R - s/p TMA  L - L lateral ulceration, early varus deformity  Neuro: R - Gross sensation absent. Gross motor function intact   L - Gross sensation absent. Gross motor function intact    ASSESSMENT/PLAN OF CARE 66 y.o. male with PMHx  significant for  significant for poorly healing wound to the left lateral foot, type 2 diabetes mellitus complicated by diabetic peripheral polyneuropathy  with Left lateral midfoot ulceration with evidence of underlying osteomyelitis of the 5th met base.   - Discussed at length with the patient again today. Recommend proximal amputation as I believe further efforts at limb salvage given size location and inadequate response to prior debridement graft and vac. Discussed do not feel further attempt at salvage will be viable and pt would ultimately require BKA.  - Appreciate vascular assistance, will discuss case further with Dr. Stanford Breed.  - Wound care: Betadine wet to dry dressing to Left foot - WB status: WBAT to LLE in post op shoe - Will continue to follow   Thank you for the consult.  Please contact me directly with any questions or concerns.           Everitt Amber, DPM [facility]    [address], [place] [address]                Office [phone]  Fax [phone]

## 2022-11-26 NOTE — ED Notes (Signed)
Patient went for vascular study at this time.

## 2022-11-26 NOTE — ED Notes (Signed)
Requested purple man from [place]. States that MD has been contacted to verify tele orders as this unit does not accept tele patients.

## 2022-11-26 NOTE — H&P (Signed)
History and Physical      Joshua [name] KVN:[id number] DOB: Jun 23, 1956 DOA: 11/25/2022  PCP: Elias Else, MD  Patient coming from: home   I have personally briefly reviewed patient's old medical records in [facility]  Chief Complaint: Nonhealing wound of the left foot  HPI: Joshua [name] is a 66 y.o. male with medical history significant for poorly healing wound to the left lateral foot, type 2 diabetes mellitus complicated by diabetic peripheral polyneuropathy, chronic diastolic heart failure, CKD 3B with baseline creatinine range 2-2.4, essential hypertension, hyperlipidemia, who is admitted to [facility] on 11/25/2022 with nonhealing wound of the left foot after presenting from podiatry clinic to [facility] ED complaining of such.  Patient with a known wound over the lateral aspect of the left foot, for which she has been working closely with Dr. Annamary Rummage In podiatry clinic.  The course of this wound has been complicated by development of osteomyelitis, status post debridement procedure by Dr. Annamary Rummage  On 11/06/2022 followed by placement of wound VAC.  In addition to these measures, he has also been on oral antibiotics over the last few weeks in the form of doxycycline and Augmentin, reporting outstanding compliance with these antibiotics over that timeframe.  However, he has noted no significant improvement in the wound associate with the left lateral aspect of his foot, with progressive maceration of this lesion noted.  He had routine follow-up with Dr.**Better acute clinic on 11/25/2022, who in the setting of no significant improvement in healing of this wound, recommended that the patient present to [facility] emergency department for admission for consideration for left left BKA.   I d/w EDP , who d/w Dr. Annamary Rummage of podiatry, who specifically recommends admission to the hospitalist service, and conveys that he will see the patient in the morning and formally consult.   He recommends that we refrain from any IV antibiotics and also refrain from continuation of outpatient oral antibiotics.  He recommends consultation of vascular surgery in the morning, noting no urgent indication for overnight consultation at this time.  Additionally, Dr.Standiford  does not recommend pursuit of additional imaging of the left foot at this time.  The patient denies any associated subjective fever, chills, rigors, or generalized myalgias.  No recent trauma to the left foot.  He has a history of coronary artery disease, with review of most recent cardiology note from 11/08/2022 conveying a recommendation for chronic dual antiplatelet therapy since 2020.  Consistent with this, the patient is on daily baby aspirin as well as ticagrelor.  Of note, the patient underwent vascular ultrasound with ABI on 11/02/2022.    ED Course:  Vital signs in the ED were notable for the following: Afebrile; heart rates in the 80s speckles of blood pressure in the 120s to 130s; respiratory rate 15-18; oxygen saturation 91% on room air.  Labs were notable for the following: BMP notable for the following: Sodium 130, creatinine 2.30, glucose 154.  CBC notable for white blood cell count 11,600 compared to most recent prior white cell count of 12,500 on 11/18/2022, hemoglobin 12.1, platelet count 312.  No updated EKG or additional imaging performed in the ED this evening.  While in the ED, the following were administered: Percocet 5/325 mg p.o. x 2 tabs.  Subsequently, the patient was admitted for further evaluation and management of nonhealing wound of the left lower extremity.    Review of Systems: As per HPI otherwise 10 point review of systems negative.  Past Medical History:  Diagnosis Date   Acute coronary syndrome (HCC)    Anemia    B12 deficiency    Chest pain    CHF (congestive heart failure) (HCC)    Chronic combined systolic and diastolic CHF (congestive heart failure) ([place])    a.  07/2015 Echo: EF 45-50%, mod LVH, mid apical/antsept AK, Gr 1 DD.   Chronic low back pain    CKD (chronic kidney disease), stage III (HCC)    Constipation    COPD (chronic obstructive pulmonary disease) (HCC)    Coronary artery disease    a. 2012 s/p MI/PCI: s/p DES to mid/dist RCA and overlapping DES to LAD;  b. 07/2015 Cath: LAD 10% ISR, D1 40(jailed), LCX 30m, OM2 30, OM3 30, RCA 17m ISR, 10d ISR; c. 07/2016 NSTEMI/PCI: LAD 85ost/p/m (3.5x20 Synergy DES overlapping prior stent), D1 40, LCX 57m, OM2 95 (2.75x16 Synergy DES), OM3 30, RCA 79m, 10d.   Depression    Diabetic gastroparesis ([place])    Diabetic polyneuropathy ([place])    DKA (diabetic ketoacidoses) 03/14/2018   GERD (gastroesophageal reflux disease)    Gout    Heart failure (HCC)    Heart murmur    History of MI (myocardial infarction)    Hyperlipemia    Hypertension    Hypertensive heart disease    Hypokalemia    Ischemic cardiomyopathy    a. 07/2015 Echo: EF 45-50%, mod LVH, mid-apicalanteroseptal AK, Gr1 DD.   Joint pain    Kidney problem    Morbid obesity ([place])    OSA on CPAP    Osteoarthritis    Osteomyelitis ([place])    a. 07/2016 MTP joint of R great toe s/p transmetatarsal amputation.   Other fatigue    Polyneuropathy in diabetes(357.2)    Pulmonary sarcoidosis ([place])    "problems w/it years ago; no problems anymore" (07/03/2014)   Rectal bleeding    Shortness of breath    Shortness of breath on exertion    Sleep apnea    Stomach ulcer    Swallowing difficulty    Type II diabetes mellitus ([place])     Past Surgical History:  Procedure Laterality Date   AMPUTATION Right 07/24/2016   Procedure: TRANSMETATARSAL FOOT AMPUTATION;  Surgeon: [name], MD;  Location: [place];  Service: Orthopedics;  Laterality: Right;   BALLOON DILATION N/A 03/21/2018   Procedure: BALLOON DILATION;  Surgeon: [name], MD;  Location[facility] ENDOSCOPY;  Service: Endoscopy;  Laterality: N/A;   BIOPSY  03/16/2022   Procedure: BIOPSY;  Surgeon:  [name], MD;  Location: [facility] ENDOSCOPY;  Service: Endoscopy;;   BREAST LUMPECTOMY Right ~ 2000   "benign"   CARDIAC CATHETERIZATION N/A 07/30/2015   Procedure: Left Heart Cath and Coronary Angiography;  Surgeon: Burnell Blanks, MD;  Location: [place] CV LAB;  Service: Cardiovascular;  Laterality: N/A;   CARDIAC CATHETERIZATION N/A 07/19/2016   Procedure: Left Heart Cath and Coronary Angiography;  Surgeon: Belva Crome, MD;  Location: [place] CV LAB;  Service: Cardiovascular;  Laterality: N/A;   CARDIAC CATHETERIZATION N/A 07/29/2016   Procedure: Coronary Stent Intervention;  Surgeon: Belva Crome, MD;  Location: [place] CV LAB;  Service: Cardiovascular;  Laterality: N/A;   CARDIAC CATHETERIZATION N/A 07/29/2016   Procedure: Intravascular Ultrasound/IVUS;  Surgeon: Belva Crome, MD;  Location: [place] CV LAB;  Service: Cardiovascular;  Laterality: N/A;   CATARACT EXTRACTION W/ INTRAOCULAR LENS  IMPLANT, BILATERAL Bilateral 2014-2015   COLONOSCOPY WITH PROPOFOL N/A  08/22/2017   Procedure: COLONOSCOPY WITH PROPOFOL;  Surgeon: [name], MD;  Location: [place];  Service: Endoscopy;  Laterality: N/A;   COLONOSCOPY WITH PROPOFOL N/A 03/16/2022   Procedure: COLONOSCOPY WITH PROPOFOL;  Surgeon: [name], MD;  Location: [facility] ENDOSCOPY;  Service: Endoscopy;  Laterality: N/A;   CORONARY ANGIOPLASTY WITH STENT PLACEMENT  10/31/2011   30 % MID ATRIOVENTRICULAR GROOVE CX STENOSIS. 90 % MID RCA.PTA TO LAD/STENTING OF TANDEM 60-70%. LAD STENOSIS 99% REDUCED TO 0%.3.0 X 32MM PROMUS DES POSTDILATED TO 3.25MM.   CORONARY ANGIOPLASTY WITH STENT PLACEMENT  07/03/2014   "2"   CORONARY STENT INTERVENTION N/A 04/09/2019   Procedure: CORONARY STENT INTERVENTION;  Surgeon: Jettie Booze, MD;  Location: [place] CV LAB;  Service: Cardiovascular;  Laterality: N/A;   CORONARY STENT INTERVENTION N/A 08/22/2019   Procedure: CORONARY STENT INTERVENTION;  Surgeon: Martinique,  Peter M, MD;  Location: [place] CV LAB;  Service: Cardiovascular;  Laterality: N/A;   ESOPHAGOGASTRODUODENOSCOPY (EGD) WITH PROPOFOL N/A 03/21/2018   Procedure: ESOPHAGOGASTRODUODENOSCOPY (EGD) WITH PROPOFOL;  Surgeon: [name], MD;  Location: [place];  Service: Endoscopy;  Laterality: N/A;   ESOPHAGOGASTRODUODENOSCOPY (EGD) WITH PROPOFOL N/A 03/16/2022   Procedure: ESOPHAGOGASTRODUODENOSCOPY (EGD) WITH PROPOFOL;  Surgeon: [name], MD;  Location: [facility]OS[facility];  Service: Endoscopy;  Laterality: N/A;   I & D EXTREMITY Right 10/30/2013   Procedure: IRRIGATION AND DEBRIDEMENT RIGHT SMALL FINGER POSSIBLE SECONDARY WOUND CLOSURE;  Surgeon: [name], MD;  Location: [facility] ORS;  Service: Orthopedics;  Laterality: Right;  [name] is available after 4pm   I & D EXTREMITY Right 11/01/2013   Procedure:  IRRIGATION AND DEBRIDEMENT RIGHT  PROXIMAL PHALANGEAL LEVEL AMPUTATION;  Surgeon: [name], MD;  Location: [facility];  Service: Orthopedics;  Laterality: Right;   INCISION AND DRAINAGE Right 10/28/2013   Procedure: INCISION AND DRAINAGE Right small finger;  Surgeon: [name], MD;  Location: [facility];  Service: Orthopedics;  Laterality: Right;   INCISION AND DRAINAGE OF WOUND Left 11/07/2022   Procedure: IRRIGATION AND DEBRIDEMENT WOUND;  Surgeon: Yevonne Pax, DPM;  Location: [place];  Service: Podiatry;  Laterality: Left;  Left foot ucler debridement and bone biopsy   LEFT HEART CATH Left 11/03/2011   Procedure: LEFT HEART CATH;  Surgeon: [name], MD;  Location: [facility] CATH LAB;  Service: Cardiovascular;  Laterality: Left;   LEFT HEART CATH AND CORONARY ANGIOGRAPHY N/A 04/09/2019   Procedure: LEFT HEART CATH AND CORONARY ANGIOGRAPHY;  Surgeon: Jettie Booze, MD;  Location: [place] CV LAB;  Service: Cardiovascular;  Laterality: N/A;   LEFT HEART CATHETERIZATION WITH CORONARY ANGIOGRAM N/A 07/03/2014   Procedure: LEFT HEART CATHETERIZATION WITH  CORONARY ANGIOGRAM;  Surgeon: [name], MD;  Location: [facility] CATH LAB;  Service: Cardiovascular;  Laterality: N/A;   PERCUTANEOUS CORONARY STENT INTERVENTION (PCI-S) Left 11/03/2011   Procedure: PERCUTANEOUS CORONARY STENT INTERVENTION (PCI-S);  Surgeon: [name], MD;  Location: [facility] CATH LAB;  Service: Cardiovascular;  Laterality: Left;   RIGHT/LEFT HEART CATH AND CORONARY ANGIOGRAPHY N/A 08/22/2019   Procedure: RIGHT/LEFT HEART CATH AND CORONARY ANGIOGRAPHY;  Surgeon: Martinique, Peter M, MD;  Location: [place] CV LAB;  Service: Cardiovascular;  Laterality: N/A;   TOE AMPUTATION Right ~ 2010   "2nd toe"   TOE AMPUTATION Right 2012   "3rd toe"    Social History:  reports that he has never smoked. He has never used smokeless tobacco. He reports current alcohol use of about 6.0  standard drinks of alcohol per week. He reports that he does not use drugs.   Allergies  Allergen Reactions   Chlorthalidone Other (See Comments)    Causes dehydration, kidneys shut down    Family History  Problem Relation Age of Onset   Sudden death Mother    Diabetes Maternal Grandmother    Hypertension Maternal Grandmother    Heart attack Maternal Aunt    Sudden death Other     Family history reviewed and not pertinent    Prior to Admission medications   Medication Sig Start Date End Date Taking? Authorizing Provider  allopurinol (ZYLOPRIM) 100 MG tablet Take 1 tablet (100 mg total) by mouth daily. 11/17/22   Aline August, MD  amoxicillin-clavulanate (AUGMENTIN) 875-125 MG tablet Take 1 tablet by mouth every 12 (twelve) hours. 11/16/22 12/16/22  Aline August, MD  ascorbic acid (VITAMIN C) 500 MG tablet Take 1 tablet (500 mg total) by mouth 2 (two) times daily. 11/16/22   Aline August, MD  aspirin EC 81 MG tablet Take 81 mg by mouth daily.     [provider]  atorvastatin (LIPITOR) 80 MG tablet Take 80 mg by mouth daily. 08/29/22   [provider]  Continuous Blood Gluc  Receiver (FREESTYLE LIBRE 2 READER) [place] See admin instructions. 11/23/22   [provider]  Continuous Blood Gluc Sensor (FREESTYLE LIBRE 2 SENSOR) MISC APPLY TOPICALLY EVERY 14 DAYS 11/01/22   Philemon Kingdom, MD  dapagliflozin propanediol (FARXIGA) 10 MG TABS tablet Take 10 mg by mouth daily.    [provider]  doxycycline (VIBRA-TABS) 100 MG tablet Take 1 tablet (100 mg total) by mouth every 12 (twelve) hours. 11/16/22 12/16/22  Aline August, MD  famotidine (PEPCID) 20 MG tablet Take 20 mg by mouth at bedtime. 11/27/20   [provider]  furosemide (LASIX) 80 MG tablet Take 0.5 tablets (40 mg total) by mouth daily. Take 1 tablet by mouth twice daily 11/16/22   Aline August, MD  gabapentin (NEURONTIN) 100 MG capsule Take 1 capsule by mouth at bedtime 10/05/22   Star Age, MD  insulin glargine (LANTUS SOLOSTAR) 100 UNIT/ML Solostar Pen Inject 5 Units into the skin 2 (two) times daily. 11/16/22   Aline August, MD  isosorbide mononitrate (IMDUR) 60 MG 24 hr tablet Take 3 tablets (180 mg total) by mouth daily. 04/06/22   [name], MD  latanoprost (XALATAN) 0.005 % ophthalmic solution Place 1 drop into both eyes at bedtime.    [provider]  Lidocaine HCl-Benzyl Alcohol (SALONPAS LIDOCAINE PLUS) 4-10 % CREA Apply 1 patch topically as needed (pain).    [provider]  linagliptin (TRADJENTA) 5 MG TABS tablet Take 1 tablet (5 mg total) by mouth daily. 11/17/22   Aline August, MD  metoprolol succinate (TOPROL-XL) 25 MG 24 hr tablet Take 3 tablets by mouth every day 09/30/22   [name], MD  nitroGLYCERIN (NITROSTAT) 0.4 MG SL tablet Place 1 tablet (0.4 mg total) under the tongue every 5 (five) minutes as needed for chest pain. 09/29/22   [name], MD  oxyCODONE-acetaminophen (PERCOCET/ROXICET) 5-325 MG tablet Take 1 tablet by mouth every 6 (six) hours as needed for moderate pain. 11/16/22   Aline August, MD  pantoprazole (PROTONIX)  40 MG tablet TAKE 1 TABLET BY MOUTH TWICE DAILY BEFORE MEAL(S) Patient taking differently: Take 40 mg by mouth 2 (two) times daily. 09/19/19   [name], MD  polyethylene glycol powder (GLYCOLAX/MIRALAX) 17 GM/SCOOP powder Take  1 capful  (17 g) by mouth daily as needed for moderate constipation. 11/16/22   Aline August, MD  senna-docusate (SENOKOT-S) 8.6-50 MG tablet Take 1 tablet by mouth 2 (two) times daily. 11/16/22   Aline August, MD  ticagrelor (BRILINTA) 90 MG TABS tablet Take 1 tablet by mouth twice daily Patient taking differently: Take 90 mg by mouth 2 (two) times daily. 09/30/22   [name], MD  Wheat Dextrin (BENEFIBER) CHEW Chew 1 tablet by mouth daily as needed (for constipation).    [provider]     Objective    Physical Exam: Vitals:   11/25/22 1515 11/25/22 1838 11/26/22 0056 11/26/22 0501  BP: 137/64 (!) 166/79 119/61 124/62  Pulse: 92 82 86 86  Resp: $Remo'16 12 15 16  'XiNDw$ Temp: 98.7 F (37.1 C) 98.4 F (36.9 C) 98.4 F (36.9 C) 98.2 F (36.8 C)  TempSrc:  Oral  Oral  SpO2: 99% 100% 100% 99%    General: appears to be stated age; alert, oriented Skin: warm, dry, macerated wound associate with the lateral aspect of the left foot, in the absence of associated crepitus Head:  AT/[place] Mouth:  Oral mucosa membranes appear moist, normal dentition Neck: supple; trachea midline Heart:  RRR; did not appreciate any M/R/G Lungs: CTAB, did not appreciate any wheezes, rales, or rhonchi Abdomen: + BS; soft, ND, NT Vascular: 2+ pedal pulses b/l; 2+ radial pulses b/l Extremities: no peripheral edema, no muscle wasting; macerated wound associated with the lateral aspect of the left foot, as further detailed above Neuro: strength intact in upper and lower extremities b/l     Labs on Admission: I have personally reviewed following labs and imaging studies  CBC: Recent Labs  Lab 11/25/22 1450 11/26/22 0411  WBC 11.6* 9.9  NEUTROABS 8.5* 7.1  HGB 12.1*  11.7*  HCT 39.1 36.5*  MCV 89.5 86.9  PLT 312 001   Basic Metabolic Panel: Recent Labs  Lab 11/25/22 1450 11/26/22 0411  NA 138 137  K 4.1 3.7  CL 106 104  CO2 22 21*  GLUCOSE 154* 166*  BUN 45* 50*  CREATININE 2.30* 2.48*  CALCIUM 9.0 9.0  MG  --  2.4   GFR: Estimated Creatinine Clearance: 39.1 mL/min (A) (by C-G formula based on SCr of 2.48 mg/dL (H)). Liver Function Tests: Recent Labs  Lab 11/26/22 0411  AST 21  ALT 16  ALKPHOS 130*  BILITOT 0.5  PROT 7.6  ALBUMIN 2.4*   No results for input(s): "LIPASE", "AMYLASE" in the last 168 hours. No results for input(s): "AMMONIA" in the last 168 hours. Coagulation Profile: Recent Labs  Lab 11/26/22 0411  INR 1.2   Cardiac Enzymes: No results for input(s): "CKTOTAL", "CKMB", "CKMBINDEX", "TROPONINI" in the last 168 hours. BNP (last 3 results) No results for input(s): "PROBNP" in the last 8760 hours. HbA1C: No results for input(s): "HGBA1C" in the last 72 hours. CBG: No results for input(s): "GLUCAP" in the last 168 hours. Lipid Profile: No results for input(s): "CHOL", "HDL", "LDLCALC", "TRIG", "CHOLHDL", "LDLDIRECT" in the last 72 hours. Thyroid Function Tests: No results for input(s): "TSH", "T4TOTAL", "FREET4", "T3FREE", "THYROIDAB" in the last 72 hours. Anemia Panel: No results for input(s): "VITAMINB12", "FOLATE", "FERRITIN", "TIBC", "IRON", "RETICCTPCT" in the last 72 hours. Urine analysis:    Component Value Date/Time   COLORURINE YELLOW 09/24/2020 1423   APPEARANCEUR CLOUDY (A) 09/24/2020 1423   LABSPEC 1.011 09/24/2020 1423   PHURINE 5.0 09/24/2020 1423   GLUCOSEU 150 (A) 09/24/2020  [place] (A) 09/24/2020 [place] 09/24/2020 1423   [place] 09/24/2020 1423   PROTEINUR 100 (A) 09/24/2020 1423   UROBILINOGEN 0.2 11/01/2011 1410   NITRITE NEGATIVE 09/24/2020 [place] 09/24/2020 1423    Radiological Exams on Admission: No results  found.    Assessment/Plan   Principal Problem:   Non-healing wound of left lower extremity Active Problems:   Hyperlipidemia   Chronic diastolic CHF (congestive heart failure) (HCC)   Stage 3b chronic kidney disease (CKD) (HCC)   DM2 (diabetes mellitus, type 2) ([place])   Essential hypertension      #) Nonhealing wound of the lateral aspect of the left foot: Nonhealing foot ulcer associate the lateral aspect of the left foot, which has been complicated by osteomyelitis, status post debridement by podiatry on 11/06/2022, and in spite of good outpatient compliance with doxycycline/Augmentin/interval wound VAC, prompting podiatry to recommend consultation to vascular surgery in the morning for consideration for left BKA, noting that the patient has already undergone vascular ultrasound with ABI on 11/02/2022, as further detailed above.  Podiatry does not recommend continuation of any antibiotics at this time, neither IV nor p.o.  Dr. Loel Lofty  To formally consult and see the patient in the morning, as further detailed above.  Patient is nonseptic and appears hemodynamically stable.  No overt evidence of acutely decompensated heart failure nor evidence of acute MI.  Consequently, there appears to be no absolute contraindication to proceeding with proposed left BKA at this time.  He is on chronic dual antiplatelet therapy over the course of the last 3 years as consequence of CAD, as noted by outpatient cardiology note from 11/08/2022.    Plan: Podiatry to formally consult, as above.  Plan for consultation of vascular surgery in the morning, per podiatry recommendation.  Refraining from antibiotics, per podiatry recommendation.  I have made the patient n.p.o. starting at 5 AM to leave open potential surgical options for today.  Prn IV fentanyl, as needed IV Zofran.  Check preoperative EKG.  Check INR.            #) Type 2 Diabetes Mellitus: documented history of such. Home insulin  regimen: Lantus 5 units subcu twice daily.  Does not appear to be on any short acting insulin at home.. Home oral hypoglycemic agents: Tradjenta, dapagliflozin. presenting blood sugar: 154. Most recent A1c noted to be 7.6% when checked on 08/26/2022.  Complicated by diabetic peripheral polyneuropathy, all likely contributing to the patient's poorly healing left foot wound.   Plan: In the setting of n.p.o. status, will pursue every 6 hours Accu-Cheks with  low dose SSI.  Hold him basal insulin for now.  hold home oral hypoglycemic agents during this hospitalization.  Hold home gabapentin for now in the setting of current n.p.o. status.               #) Chronic diastolic heart failure: documented history of such, with most recent echocardiogram performed on 08/19/2021, which was notable for LVEF 55 to 14%, grade 2 diastolic dysfunction, normal right ventricular systolic function and no evidence of significant valvular pathology. No clinical evidence to suggest acutely decompensated heart failure at this time. home diuretic regimen reportedly consists of the following: Lasix 40 mg p.o. twice daily.  Given clinical appearance of relative euvolemia as well as current n.p.o. status, will refrain from scheduled diuresis overnight, and plan to reevaluate volume status in the morning.   Plan: monitor strict  I's & O's and daily weights. Repeat CMP in AM. Check serum mag level.  Will next dose of Lasix in the setting of current n.p.o. status, as above.              #) CKD Stage 3B: Documented history of such, with baseline creatinine 2.0-2.4, with presenting creatinine consistent with this baseline.    Plan: Monitor strict I's and O's and daily weights.  Attempt to avoid nephrotoxic agents.  CMP/magnesium level in the AM.                #) Essential Hypertension: documented h/o such, with outpatient antihypertensive regimen including Imdur, metoprolol succinate in addition to  aforementioned Lasix.  SBP's in the ED today: Normotensive mmHg.   Plan: Close monitoring of subsequent BP via routine VS. in the setting of current n.p.o. status, will hold home hypertensive medications for now.                 #) Hyperlipidemia: documented h/o such. On high intensity atorvastatin as outpatient.   Plan: In the setting of current n.p.o. status, will hold home statin for now.        DVT prophylaxis: SCD's   Code Status: Full code Family Communication: none Disposition Plan: Per Rounding Team Consults called: EDP d/ w on-call podiatry, Dr. Loel Lofty , who will formally consult and see the patient in the morning, as further detailed above;  Admission status: Inpatient     I SPENT GREATER THAN 75  MINUTES IN CLINICAL CARE TIME/MEDICAL DECISION-MAKING IN COMPLETING THIS ADMISSION.      [place] DO [facility]  From [place]   11/26/2022, 6:35 AM

## 2022-11-26 NOTE — Consult Note (Addendum)
Hospital Consult    Reason for Consult: Nonhealing left foot wound Requesting Physician: ED MRN #:  [id number]  History of Present Illness: This is a 66 y.o. male with past medical history significant for insulin-dependent diabetes mellitus, CHF, CKD stage III, hypertension, hyperlipidemia, and CAD with PCI.  He is being seen in consultation for evaluation of nonhealing left foot wound despite several surgeries by podiatry.  As of November 20 of this year his left ABI is 1.15 with a toe pressure of 103 mmHg.  He denies claudication or rest pain however he does have diabetic neuropathy.  He denies tobacco use.  Surgical history significant for right TMA in 2017 by Dr. Sharol Given.  Past Medical History:  Diagnosis Date   Acute coronary syndrome (HCC)    Anemia    B12 deficiency    Chest pain    CHF (congestive heart failure) (HCC)    Chronic combined systolic and diastolic CHF (congestive heart failure) ([place])    a. 07/2015 Echo: EF 45-50%, mod LVH, mid apical/antsept AK, Gr 1 DD.   Chronic low back pain    CKD (chronic kidney disease), stage III (HCC)    Constipation    COPD (chronic obstructive pulmonary disease) (HCC)    Coronary artery disease    a. 2012 s/p MI/PCI: s/p DES to mid/dist RCA and overlapping DES to LAD;  b. 07/2015 Cath: LAD 10% ISR, D1 40(jailed), LCX 40m, OM2 30, OM3 30, RCA 40m ISR, 10d ISR; c. 07/2016 NSTEMI/PCI: LAD 85ost/p/m (3.5x20 Synergy DES overlapping prior stent), D1 40, LCX 73m, OM2 95 (2.75x16 Synergy DES), OM3 30, RCA 84m, 10d.   Depression    Diabetic gastroparesis ([place])    Diabetic polyneuropathy ([place])    DKA (diabetic ketoacidoses) 03/14/2018   GERD (gastroesophageal reflux disease)    Gout    Heart failure (HCC)    Heart murmur    History of MI (myocardial infarction)    Hyperlipemia    Hypertension    Hypertensive heart disease    Hypokalemia    Ischemic cardiomyopathy    a. 07/2015 Echo: EF 45-50%, mod LVH, mid-apicalanteroseptal AK, Gr1 DD.   Joint  pain    Kidney problem    Morbid obesity ([place])    OSA on CPAP    Osteoarthritis    Osteomyelitis ([place])    a. 07/2016 MTP joint of R great toe s/p transmetatarsal amputation.   Other fatigue    Polyneuropathy in diabetes(357.2)    Pulmonary sarcoidosis ([place])    "problems w/it years ago; no problems anymore" (07/03/2014)   Rectal bleeding    Shortness of breath    Shortness of breath on exertion    Sleep apnea    Stomach ulcer    Swallowing difficulty    Type II diabetes mellitus ([place])     Past Surgical History:  Procedure Laterality Date   AMPUTATION Right 07/24/2016   Procedure: TRANSMETATARSAL FOOT AMPUTATION;  Surgeon: [name], MD;  Location: [place];  Service: Orthopedics;  Laterality: Right;   BALLOON DILATION N/A 03/21/2018   Procedure: BALLOON DILATION;  Surgeon: [name], MD;  Location: [facility] ENDOSCOPY;  Service: Endoscopy;  Laterality: N/A;   BIOPSY  03/16/2022   Procedure: BIOPSY;  Surgeon: [name], MD;  Location: [facility] ENDOSCOPY;  Service: Endoscopy;;   BREAST LUMPECTOMY Right ~ 2000   "benign"   CARDIAC CATHETERIZATION N/A 07/30/2015   Procedure: Left Heart Cath and Coronary Angiography;  Surgeon: Burnell Blanks, MD;  Location: [place]  CV LAB;  Service: Cardiovascular;  Laterality: N/A;   CARDIAC CATHETERIZATION N/A 07/19/2016   Procedure: Left Heart Cath and Coronary Angiography;  Surgeon: Belva Crome, MD;  Location: [place] CV LAB;  Service: Cardiovascular;  Laterality: N/A;   CARDIAC CATHETERIZATION N/A 07/29/2016   Procedure: Coronary Stent Intervention;  Surgeon: Belva Crome, MD;  Location: [place] CV LAB;  Service: Cardiovascular;  Laterality: N/A;   CARDIAC CATHETERIZATION N/A 07/29/2016   Procedure: Intravascular Ultrasound/IVUS;  Surgeon: Belva Crome, MD;  Location: [place] CV LAB;  Service: Cardiovascular;  Laterality: N/A;   CATARACT EXTRACTION W/ INTRAOCULAR LENS  IMPLANT, BILATERAL Bilateral 2014-2015   COLONOSCOPY WITH  PROPOFOL N/A 08/22/2017   Procedure: COLONOSCOPY WITH PROPOFOL;  Surgeon: [name], MD;  Location: [place];  Service: Endoscopy;  Laterality: N/A;   COLONOSCOPY WITH PROPOFOL N/A 03/16/2022   Procedure: COLONOSCOPY WITH PROPOFOL;  Surgeon: [name], MD;  Location: [facility] ENDOSCOPY;  Service: Endoscopy;  Laterality: N/A;   CORONARY ANGIOPLASTY WITH STENT PLACEMENT  10/31/2011   30 % MID ATRIOVENTRICULAR GROOVE CX STENOSIS. 90 % MID RCA.PTA TO LAD/STENTING OF TANDEM 60-70%. LAD STENOSIS 99% REDUCED TO 0%.3.0 X 32MM PROMUS DES POSTDILATED TO 3.25MM.   CORONARY ANGIOPLASTY WITH STENT PLACEMENT  07/03/2014   "2"   CORONARY STENT INTERVENTION N/A 04/09/2019   Procedure: CORONARY STENT INTERVENTION;  Surgeon: Jettie Booze, MD;  Location: [place] CV LAB;  Service: Cardiovascular;  Laterality: N/A;   CORONARY STENT INTERVENTION N/A 08/22/2019   Procedure: CORONARY STENT INTERVENTION;  Surgeon: Martinique, Peter M, MD;  Location: [place] CV LAB;  Service: Cardiovascular;  Laterality: N/A;   ESOPHAGOGASTRODUODENOSCOPY (EGD) WITH PROPOFOL N/A 03/21/2018   Procedure: ESOPHAGOGASTRODUODENOSCOPY (EGD) WITH PROPOFOL;  Surgeon: [name], MD;  Location: [place];  Service: Endoscopy;  Laterality: N/A;   ESOPHAGOGASTRODUODENOSCOPY (EGD) WITH PROPOFOL N/A 03/16/2022   Procedure: ESOPHAGOGASTRODUODENOSCOPY (EGD) WITH PROPOFOL;  Surgeon: [name], MD;  Location: [facility] ENDOSCOPY;  Service: Endoscopy;  Laterality: N/A;   I & D EXTREMITY Right 10/30/2013   Procedure: IRRIGATION AND DEBRIDEMENT RIGHT SMALL FINGER POSSIBLE SECONDARY WOUND CLOSURE;  Surgeon: [name], MD;  Location: [facility] ORS;  Service: Orthopedics;  Laterality: Right;  [name] is available after 4pm   I & D EXTREMITY Right 11/01/2013   Procedure:  IRRIGATION AND DEBRIDEMENT RIGHT  PROXIMAL PHALANGEAL LEVEL AMPUTATION;  Surgeon: [name], MD;  Location: [facility] ORS;  Service: Orthopedics;  Laterality: Right;    INCISION AND DRAINAGE Right 10/28/2013   Procedure: INCISION AND DRAINAGE Right small finger;  Surgeon: [name], MD;  Location: [facility] ORS;  Service: Orthopedics;  Laterality: Right;   INCISION AND DRAINAGE OF WOUND Left 11/07/2022   Procedure: IRRIGATION AND DEBRIDEMENT WOUND;  Surgeon: Yevonne Pax, DPM;  Location: [place];  Service: Podiatry;  Laterality: Left;  Left foot ucler debridement and bone biopsy   LEFT HEART CATH Left 11/03/2011   Procedure: LEFT HEART CATH;  Surgeon: [name], MD;  Location: [facility] CATH LAB;  Service: Cardiovascular;  Laterality: Left;   LEFT HEART CATH AND CORONARY ANGIOGRAPHY N/A 04/09/2019   Procedure: LEFT HEART CATH AND CORONARY ANGIOGRAPHY;  Surgeon: Jettie Booze, MD;  Location: [place] CV LAB;  Service: Cardiovascular;  Laterality: N/A;   LEFT HEART CATHETERIZATION WITH CORONARY ANGIOGRAM N/A 07/03/2014   Procedure: LEFT HEART CATHETERIZATION WITH CORONARY ANGIOGRAM;  Surgeon: [name], MD;  Location: [facility] CATH LAB;  Service: Cardiovascular;  Laterality: N/A;  PERCUTANEOUS CORONARY STENT INTERVENTION (PCI-S) Left 11/03/2011   Procedure: PERCUTANEOUS CORONARY STENT INTERVENTION (PCI-S);  Surgeon: [name], MD;  Location: [facility] CATH LAB;  Service: Cardiovascular;  Laterality: Left;   RIGHT/LEFT HEART CATH AND CORONARY ANGIOGRAPHY N/A 08/22/2019   Procedure: RIGHT/LEFT HEART CATH AND CORONARY ANGIOGRAPHY;  Surgeon: Martinique, Peter M, MD;  Location: [place] CV LAB;  Service: Cardiovascular;  Laterality: N/A;   TOE AMPUTATION Right ~ 2010   "2nd toe"   TOE AMPUTATION Right 2012   "3rd toe"    Allergies  Allergen Reactions   Chlorthalidone Other (See Comments)    Causes dehydration, kidneys shut down    Prior to Admission medications   Medication Sig Start Date End Date Taking? Authorizing Provider  allopurinol (ZYLOPRIM) 100 MG tablet Take 1 tablet (100 mg total) by mouth daily. 11/17/22   Aline August, MD   amoxicillin-clavulanate (AUGMENTIN) 875-125 MG tablet Take 1 tablet by mouth every 12 (twelve) hours. 11/16/22 12/16/22  Aline August, MD  ascorbic acid (VITAMIN C) 500 MG tablet Take 1 tablet (500 mg total) by mouth 2 (two) times daily. 11/16/22   Aline August, MD  aspirin EC 81 MG tablet Take 81 mg by mouth daily.     [provider]  atorvastatin (LIPITOR) 80 MG tablet Take 80 mg by mouth daily. 08/29/22   [provider]  Continuous Blood Gluc Receiver (FREESTYLE LIBRE 2 READER) [place] See admin instructions. 11/23/22   [provider]  Continuous Blood Gluc Sensor (FREESTYLE LIBRE 2 SENSOR) MISC APPLY TOPICALLY EVERY 14 DAYS 11/01/22   Philemon Kingdom, MD  dapagliflozin propanediol (FARXIGA) 10 MG TABS tablet Take 10 mg by mouth daily.    [provider]  doxycycline (VIBRA-TABS) 100 MG tablet Take 1 tablet (100 mg total) by mouth every 12 (twelve) hours. 11/16/22 12/16/22  Aline August, MD  famotidine (PEPCID) 20 MG tablet Take 20 mg by mouth at bedtime. 11/27/20   [provider]  furosemide (LASIX) 80 MG tablet Take 0.5 tablets (40 mg total) by mouth daily. Take 1 tablet by mouth twice daily 11/16/22   Aline August, MD  gabapentin (NEURONTIN) 100 MG capsule Take 1 capsule by mouth at bedtime 10/05/22   Star Age, MD  insulin glargine (LANTUS SOLOSTAR) 100 UNIT/ML Solostar Pen Inject 5 Units into the skin 2 (two) times daily. 11/16/22   Aline August, MD  isosorbide mononitrate (IMDUR) 60 MG 24 hr tablet Take 3 tablets (180 mg total) by mouth daily. 04/06/22   [name], MD  latanoprost (XALATAN) 0.005 % ophthalmic solution Place 1 drop into both eyes at bedtime.    [provider]  Lidocaine HCl-Benzyl Alcohol (SALONPAS LIDOCAINE PLUS) 4-10 % CREA Apply 1 patch topically as needed (pain).    [provider]  linagliptin (TRADJENTA) 5 MG TABS tablet Take 1 tablet (5 mg total) by mouth daily. 11/17/22   Aline August, MD   metoprolol succinate (TOPROL-XL) 25 MG 24 hr tablet Take 3 tablets by mouth every day 09/30/22   [name], MD  nitroGLYCERIN (NITROSTAT) 0.4 MG SL tablet Place 1 tablet (0.4 mg total) under the tongue every 5 (five) minutes as needed for chest pain. 09/29/22   [name], MD  oxyCODONE-acetaminophen (PERCOCET/ROXICET) 5-325 MG tablet Take 1 tablet by mouth every 6 (six) hours as needed for moderate pain. 11/16/22   Aline August, MD  pantoprazole (PROTONIX) 40 MG tablet TAKE 1 TABLET BY MOUTH TWICE DAILY BEFORE MEAL(S) Patient taking  differently: Take 40 mg by mouth 2 (two) times daily. 09/19/19   [name], MD  polyethylene glycol powder (GLYCOLAX/MIRALAX) 17 GM/SCOOP powder Take 1 capful  (17 g) by mouth daily as needed for moderate constipation. 11/16/22   Aline August, MD  senna-docusate (SENOKOT-S) 8.6-50 MG tablet Take 1 tablet by mouth 2 (two) times daily. 11/16/22   Aline August, MD  ticagrelor (BRILINTA) 90 MG TABS tablet Take 1 tablet by mouth twice daily Patient taking differently: Take 90 mg by mouth 2 (two) times daily. 09/30/22   [name], MD  Wheat Dextrin (BENEFIBER) CHEW Chew 1 tablet by mouth daily as needed (for constipation).    [provider]    Social History   Socioeconomic History   Marital status: Married    Spouse name: Dorian Pod   Number of children: 3   Years of education: Not on file   Highest education level: Not on file  Occupational History    Comment: disabled  Tobacco Use   Smoking status: Never   Smokeless tobacco: Never  Vaping Use   Vaping Use: Never used  Substance and Sexual Activity   Alcohol use: Yes    Alcohol/week: 6.0 standard drinks of alcohol    Types: 6 Cans of beer per week    Comment: social binge drinking per patinet   Drug use: No   Sexual activity: Yes    Partners: Female  Other Topics Concern   Not on file  Social History Narrative   Not on file   Social Determinants of Health   Financial  Resource Strain: Not on file  Food Insecurity: Not on file  Transportation Needs: No Transportation Needs (11/06/2022)   PRAPARE - Hydrologist (Medical): No    Lack of Transportation (Non-Medical): No  Physical Activity: Not on file  Stress: Not on file  Social Connections: Not on file  Intimate Partner Violence: Not on file     Family History  Problem Relation Age of Onset   Sudden death Mother    Diabetes Maternal Grandmother    Hypertension Maternal Grandmother    Heart attack Maternal Aunt    Sudden death Other     ROS: Otherwise negative unless mentioned in HPI  Physical Examination  Vitals:   11/26/22 0501 11/26/22 0840  BP: 124/62 (!) 154/78  Pulse: 86 85  Resp: 16 16  Temp: 98.2 F (36.8 C) 98 F (36.7 C)  SpO2: 99% 100%   There is no height or weight on file to calculate BMI.  General:  WDWN in NAD Gait: Not observed HENT: WNL, normocephalic Pulmonary: normal non-labored breathing, without Rales, rhonchi,  wheezing Cardiac: regular Abdomen:  soft, NT/ND, no masses Skin: without rashes Vascular Exam/Pulses: Symmetrical femoral pulses; unable to feel pedal pulses of the left foot however left foot and ankle are edematous Extremities: Left lateral foot wound pictured below Musculoskeletal: no muscle wasting or atrophy  Neurologic: A&O X 3;  No focal weakness or paresthesias are detected; speech is fluent/normal Psychiatric:  The pt has Normal affect. Lymph:  Unremarkable    CBC    Component Value Date/Time   WBC 9.9 11/26/2022 0411   RBC 4.20 (L) 11/26/2022 0411   HGB 11.7 (L) 11/26/2022 0411   HGB 12.3 (L) 08/11/2021 0929   HCT 36.5 (L) 11/26/2022 0411   HCT 39.1 08/11/2021 0929   PLT 281 11/26/2022 0411   PLT 254 08/11/2021 0929   MCV 86.9 11/26/2022 0411  MCV 84 08/11/2021 0929   MCH 27.9 11/26/2022 0411   MCHC 32.1 11/26/2022 0411   RDW 18.6 (H) 11/26/2022 0411   RDW 15.9 (H) 08/11/2021 0929   LYMPHSABS 1.6  11/26/2022 0411   LYMPHSABS 2.0 08/11/2021 0929   MONOABS 1.1 (H) 11/26/2022 0411   EOSABS 0.0 11/26/2022 0411   EOSABS 0.2 08/11/2021 0929   BASOSABS 0.0 11/26/2022 0411   BASOSABS 0.0 08/11/2021 0929    BMET    Component Value Date/Time   NA 137 11/26/2022 0411   NA 141 10/26/2021 1044   K 3.7 11/26/2022 0411   CL 104 11/26/2022 0411   CO2 21 (L) 11/26/2022 0411   GLUCOSE 166 (H) 11/26/2022 0411   BUN 50 (H) 11/26/2022 0411   BUN 33 (H) 10/26/2021 1044   CREATININE 2.48 (H) 11/26/2022 0411   CREATININE 2.11 (H) 11/18/2022 1605   CALCIUM 9.0 11/26/2022 0411   GFRNONAA 28 (L) 11/26/2022 0411   GFRNONAA 51 (L) 11/01/2016 1012   GFRAA 27 (L) 08/23/2019 0340   GFRAA 58 (L) 11/01/2016 1012    COAGS: Lab Results  Component Value Date   INR 1.2 11/26/2022   INR 0.9 04/06/2019   INR 1.00 07/19/2016     Non-Invasive Vascular Imaging:    11/01/2022  ABI/TBIToday's ABIToday's TBIPrevious ABIPrevious TBI  +-------+-----------+-----------+------------+------------+  Right 1.28       amp.       0.90                      +-------+-----------+-----------+------------+------------+  Left  1.15       0.70       1.05                      +-------+-----------+-----------+------------+------------+     ASSESSMENT/PLAN: This is a 66 y.o. male with extensive nonhealing wound of left lateral foot;   -Left foot wound probes down to the bone suggesting osteomyelitis.  Despite ABI suggesting multiphasic flow with a normal toe pressure I am unable to palpate pedal pulses.  This may be secondary to edema of the foot and ankle.  Given that he is CKD stage III with most recent creatinine of 2.5 and GFR of 28 he would be high risk renal failure and need for dialysis with contrasted angiography.  Agree with admission for antibiotics and continued wound care.  If renal function improves we could consider angiography however I believe this is close to his baseline.  We will start  with noninvasive studies; I have ordered a left leg arterial duplex.  I discussed with the patient that given the extent of his wound he most likely will require a below the knee amputation.  He is aware for possible need for left leg amputation however would like to make sure that everything was done for limb salvage.  On-call vascular surgeon Dr. Stanford Breed will evaluate the patient later today and provide further treatment plans.   Dagoberto Ligas PA-C [facility] [phone]  VASCULAR STAFF ADDENDUM: I have independently interviewed and examined the patient. I agree with the above.  Severe ulceration about the left ankle.  Non-invasive studies suggest good flow to the foot. Counseled him that angiography would expose him to low risk of progression of renal dysfunction and dialysis. He would like to pursue angiography. He is at high risk for BKA. Discussed in detail with the patient and Dr. Loel Lofty.  [name]. Stanford Breed, MD FACS [facility] Phone Number: [phone] 11/26/2022 5:30 PM

## 2022-11-27 DIAGNOSIS — I70222 Atherosclerosis of native arteries of extremities with rest pain, left leg: Secondary | ICD-10-CM | POA: Diagnosis not present

## 2022-11-27 DIAGNOSIS — I25119 Atherosclerotic heart disease of native coronary artery with unspecified angina pectoris: Secondary | ICD-10-CM | POA: Diagnosis not present

## 2022-11-27 DIAGNOSIS — Z01810 Encounter for preprocedural cardiovascular examination: Secondary | ICD-10-CM | POA: Diagnosis not present

## 2022-11-27 DIAGNOSIS — S81802A Unspecified open wound, left lower leg, initial encounter: Secondary | ICD-10-CM | POA: Diagnosis not present

## 2022-11-27 LAB — BASIC METABOLIC PANEL
Anion gap: 9 (ref 5–15)
BUN: 40 mg/dL — ABNORMAL HIGH (ref 8–23)
CO2: 21 mmol/L — ABNORMAL LOW (ref 22–32)
Calcium: 8.8 mg/dL — ABNORMAL LOW (ref 8.9–10.3)
Chloride: 105 mmol/L (ref 98–111)
Creatinine, Ser: 2.06 mg/dL — ABNORMAL HIGH (ref 0.61–1.24)
GFR, Estimated: 35 mL/min — ABNORMAL LOW (ref >60)
Glucose, Bld: 219 mg/dL — ABNORMAL HIGH (ref 70–99)
Potassium: 3.8 mmol/L (ref 3.5–5.1)
Sodium: 135 mmol/L (ref 135–145)

## 2022-11-27 LAB — CBC
HCT: 37.7 % — ABNORMAL LOW (ref 39.0–52.0)
Hemoglobin: 12 g/dL — ABNORMAL LOW (ref 13.0–17.0)
MCH: 27.9 pg (ref 26.0–34.0)
MCHC: 31.8 g/dL (ref 30.0–36.0)
MCV: 87.7 fL (ref 80.0–100.0)
Platelets: 288 10*3/uL (ref 150–400)
RBC: 4.3 MIL/uL (ref 4.22–5.81)
RDW: 18.6 % — ABNORMAL HIGH (ref 11.5–15.5)
WBC: 8.9 10*3/uL (ref 4.0–10.5)
nRBC: 0 % (ref 0.0–0.2)

## 2022-11-27 LAB — GLUCOSE, CAPILLARY
Glucose-Capillary: 200 mg/dL — ABNORMAL HIGH (ref 70–99)
Glucose-Capillary: 335 mg/dL — ABNORMAL HIGH (ref 70–99)
Glucose-Capillary: 260 mg/dL — ABNORMAL HIGH (ref 70–99)
Glucose-Capillary: 174 mg/dL — ABNORMAL HIGH (ref 70–99)

## 2022-11-27 LAB — TROPONIN I (HIGH SENSITIVITY)
Troponin I (High Sensitivity): 35 ng/L — ABNORMAL HIGH (ref <18)
Troponin I (High Sensitivity): 40 ng/L — ABNORMAL HIGH (ref <18)

## 2022-11-27 MED ORDER — INSULIN GLARGINE-YFGN 100 UNIT/ML ~~LOC~~ SOLN
10.0000 [IU] | Freq: Every day | SUBCUTANEOUS | Status: DC
  Administered 2022-11-27 – 2022-12-07 (×11): 10 [IU] via SUBCUTANEOUS
  Filled 2022-11-27 (×12): qty 0.1

## 2022-11-27 MED ORDER — NITROGLYCERIN 0.4 MG SL SUBL
SUBLINGUAL_TABLET | SUBLINGUAL | Status: AC
  Administered 2022-11-27: 0.4 mg via SUBLINGUAL
  Filled 2022-11-27: qty 1

## 2022-11-27 MED ORDER — NITROGLYCERIN 0.4 MG SL SUBL: 0.4000 mg | SUBLINGUAL_TABLET | SUBLINGUAL | Status: DC | PRN

## 2022-11-27 MED ORDER — VITAMIN C 500 MG PO TABS
500.0000 mg | ORAL_TABLET | Freq: Two times a day (BID) | ORAL | Status: DC
  Administered 2022-11-27 – 2022-12-02 (×11): 500 mg via ORAL
  Filled 2022-11-27 (×11): qty 1

## 2022-11-27 MED ORDER — INSULIN GLARGINE-YFGN 100 UNIT/ML ~~LOC~~ SOLN
25.0000 [IU] | Freq: Every day | SUBCUTANEOUS | Status: DC
  Administered 2022-11-27 – 2022-11-28 (×2): 25 [IU] via SUBCUTANEOUS
  Filled 2022-11-27 (×2): qty 0.25

## 2022-11-27 MED ORDER — ZINC SULFATE 220 (50 ZN) MG PO CAPS
220.0000 mg | ORAL_CAPSULE | Freq: Every day | ORAL | Status: DC
  Administered 2022-11-27 – 2022-12-09 (×13): 220 mg via ORAL
  Filled 2022-11-27 (×13): qty 1

## 2022-11-27 MED ORDER — ASPIRIN 81 MG PO TBEC
81.0000 mg | DELAYED_RELEASE_TABLET | Freq: Every day | ORAL | Status: DC
  Administered 2022-11-27 – 2022-12-09 (×13): 81 mg via ORAL
  Filled 2022-11-27 (×13): qty 1

## 2022-11-27 MED ORDER — NITROGLYCERIN 0.4 MG/SPRAY TL SOLN: 1.0000 | Status: DC | PRN

## 2022-11-27 MED ORDER — INSULIN ASPART 100 UNIT/ML IJ SOLN
4.0000 [IU] | Freq: Three times a day (TID) | INTRAMUSCULAR | Status: DC
  Administered 2022-11-27 – 2022-11-28 (×2): 4 [IU] via SUBCUTANEOUS

## 2022-11-27 MED ORDER — TICAGRELOR 90 MG PO TABS: 90.0000 mg | ORAL_TABLET | Freq: Two times a day (BID) | ORAL | Status: DC

## 2022-11-27 MED ORDER — JUVEN PO PACK
1.0000 | PACK | Freq: Two times a day (BID) | ORAL | Status: DC
  Administered 2022-11-27 – 2022-12-09 (×23): 1 via ORAL
  Filled 2022-11-27 (×23): qty 1

## 2022-11-27 MED ORDER — GABAPENTIN 100 MG PO CAPS
100.0000 mg | ORAL_CAPSULE | Freq: Every day | ORAL | Status: DC
  Administered 2022-11-27 – 2022-12-08 (×12): 100 mg via ORAL
  Filled 2022-11-27 (×12): qty 1

## 2022-11-27 MED ORDER — ISOSORBIDE MONONITRATE ER 60 MG PO TB24
180.0000 mg | ORAL_TABLET | Freq: Every day | ORAL | Status: DC
  Administered 2022-11-27 – 2022-12-09 (×13): 180 mg via ORAL
  Filled 2022-11-27 (×13): qty 3

## 2022-11-27 MED ORDER — ADULT MULTIVITAMIN W/MINERALS CH
1.0000 | ORAL_TABLET | Freq: Every day | ORAL | Status: DC
  Administered 2022-11-27 – 2022-12-09 (×13): 1 via ORAL
  Filled 2022-11-27 (×13): qty 1

## 2022-11-27 MED ORDER — ENSURE MAX PROTEIN PO LIQD
11.0000 [oz_av] | Freq: Every day | ORAL | Status: DC
  Administered 2022-11-27 – 2022-11-30 (×4): 11 [oz_av] via ORAL
  Filled 2022-11-27 (×8): qty 330

## 2022-11-27 NOTE — Progress Notes (Addendum)
PROGRESS NOTE    Joshua [name]  XWD:[id number] DOB: 03-17-1956 DOA: 11/25/2022 PCP: Elias Else, MD   Brief Narrative: 66 year old with past medical history significant for poorly healing wound of the left lateral foot, diabetes type 2 complicated by diabetic poorly neuropathy, chronic diastolic heart failure, CKD stage IIIb, creatinine baseline 2--2.4, hypertension, hyperlipidemia who presents with nonhealing wound of the left foot after refer for admission from podiatry clinic.  Dr. Sarita Bottom for podiatry is recommending left BKA.  Also patient will need vascular consultation.   Assessment & Plan:   Principal Problem:   Non-healing wound of left lower extremity Active Problems:   Hyperlipidemia   Chronic diastolic CHF (congestive heart failure) (HCC)   Stage 3b chronic kidney disease (CKD) (HCC)   DM2 (diabetes mellitus, type 2) (HCC)   Essential hypertension   1-Nonhealing wound of the lateral aspect of the left foot:, complicated by osteomyelitis: -Status postdebridement by podiatry on 11/06/2022. -He has been on oral antibiotics and wound VAC as an outpatient but wound continued  not improve. -ABI done in 11/21st/2023: -Podiatry is recommending left BKA. -Vascular recommend initiation of antibiotics. Started Linezolid, ceftriaxone, flagyl.  -plan for arteriogram this admission.   2-Diabetes type 2: Hyperglycemia.  -Diabetic neuropathy -Recent A1c 7.6. -Continue sliding scale insulin -Resume long acting. Semglee 25 units am and 10 units HS> . Will add meals coverage.  -Resume gabapentin.   3-Chronic diastolic heart failure: Last echo 08/2021 ejection fraction 55 to 60% grade 2 diastolic dysfunction. Hold lasix.  Monitor volume status.  CKD stage IIIb: Creatinine range 2.0--2.4 Stable.   CAD; History stent placement LAD; Develops chest pain today.  Resolved with nitroglycerin.  EKG V 2, V3 more pronounce T wave.  Plan to cycle troponin.  Resume Imdur. Discussed  with vascular ok to resume aspirin and brilinta.  Cardiology consulted. Discussed with cardio ok to hold brilinta, in anticipation of BKA>  On metoprolol and statins.   Hypertension:  PRN hydralazine.  Resume Metoprolol/  Resume  imdur , resume tomorrow.   Hyperlipidemia: Resume statins.   Vascular diseases:  ABI : 11/02/2022 ABI/TBIToday's ABIToday's TBIPrevious ABIPrevious TBI  +-------+-----------+-----------+------------+------------+  Right 1.28       amp.       0.90                      +-------+-----------+-----------+------------+------------+  Left  1.15       0.70       1.05     Vascular consulted.  Plan for arteriogram this admission.       Estimated body mass index is 39.87 kg/m as calculated from the following:   Height as of this encounter: 5\' 9"  (1.753 m).   Weight as of this encounter: 122.5 kg.   DVT prophylaxis: SCD Code Status: Full code Family Communication: care discussed with patient Disposition Plan:  Status is: Inpatient Remains inpatient appropriate because:     Consultants:  Vascular   Procedures:    Antimicrobials:    Subjective: He is doing ok. He later complain of chest pain, similar than before usually relieves with nitro  Report chest pain as strain, different pain as before rare sensation, pain was getting worse.   Objective: Vitals:   11/26/22 1655 11/26/22 1942 11/26/22 2347 11/27/22 0219  BP:  (!) 155/51 134/62 132/81  Pulse:  88 76 78  Resp:  18 18 18   Temp:  98.2 F (36.8 C) 97.8 F (36.6 C) 97.9 F (36.6 C)  TempSrc:  Oral Oral Oral  SpO2:  100% 100% 99%  Weight: 122.5 kg     Height: $Remove'5\' 9"'KlgeEbP$  (1.753 m)      No intake or output data in the 24 hours ending 11/27/22 0706 Filed Weights   11/26/22 1655  Weight: 122.5 kg    Examination:  General exam: NAD Respiratory system: CTA Cardiovascular system: S 1, S 2 RRR Gastrointestinal system: BS present, soft nt Central nervous system:  alert Extremities: no edema, dressing left foot, open wound.   Data Reviewed: I have personally reviewed following labs and imaging studies  CBC: Recent Labs  Lab 11/25/22 1450 11/26/22 0411  WBC 11.6* 9.9  NEUTROABS 8.5* 7.1  HGB 12.1* 11.7*  HCT 39.1 36.5*  MCV 89.5 86.9  PLT 312 664    Basic Metabolic Panel: Recent Labs  Lab 11/25/22 1450 11/26/22 0411  NA 138 137  K 4.1 3.7  CL 106 104  CO2 22 21*  GLUCOSE 154* 166*  BUN 45* 50*  CREATININE 2.30* 2.48*  CALCIUM 9.0 9.0  MG  --  2.4    GFR: Estimated Creatinine Clearance: 38.9 mL/min (A) (by C-G formula based on SCr of 2.48 mg/dL (H)). Liver Function Tests: Recent Labs  Lab 11/26/22 0411  AST 21  ALT 16  ALKPHOS 130*  BILITOT 0.5  PROT 7.6  ALBUMIN 2.4*    No results for input(s): "LIPASE", "AMYLASE" in the last 168 hours. No results for input(s): "AMMONIA" in the last 168 hours. Coagulation Profile: Recent Labs  Lab 11/26/22 0411  INR 1.2    Cardiac Enzymes: No results for input(s): "CKTOTAL", "CKMB", "CKMBINDEX", "TROPONINI" in the last 168 hours. BNP (last 3 results) No results for input(s): "PROBNP" in the last 8760 hours. HbA1C: No results for input(s): "HGBA1C" in the last 72 hours. CBG: Recent Labs  Lab 11/26/22 1151 11/26/22 1750 11/26/22 2057  GLUCAP 145* 175* 334*   Lipid Profile: No results for input(s): "CHOL", "HDL", "LDLCALC", "TRIG", "CHOLHDL", "LDLDIRECT" in the last 72 hours. Thyroid Function Tests: No results for input(s): "TSH", "T4TOTAL", "FREET4", "T3FREE", "THYROIDAB" in the last 72 hours. Anemia Panel: No results for input(s): "VITAMINB12", "FOLATE", "FERRITIN", "TIBC", "IRON", "RETICCTPCT" in the last 72 hours. Sepsis Labs: No results for input(s): "PROCALCITON", "LATICACIDVEN" in the last 168 hours.  No results found for this or any previous visit (from the past 240 hour(s)).       Radiology Studies: VAS [place] LOWER EXTREMITY ARTERIAL DUPLEX  Result  Date: 11/26/2022 LOWER EXTREMITY ARTERIAL DUPLEX STUDY Patient Name:  Joshua [name]  Date of Exam:   11/26/2022 Medical Rec #: [id number]         Accession #:    [id number] Date of Birth: 1956-11-06         Patient Gender: M Patient Age:   29 years Exam Location:  [facility] Procedure:      VAS [place] LOWER EXTREMITY ARTERIAL DUPLEX Referring Phys: Dagoberto Ligas --------------------------------------------------------------------------------  Indications: Peripheral artery disease, and Non healing left foot wound and              right transmetatarsal amputation. High Risk Factors: Hypertension, hyperlipidemia, Diabetes, coronary artery                    disease.  Current ABI: 11/01/22 - Rt 1.28, lt 1.1 Comparison Study: No priors. Performing Technologist: Oda Cogan RDMS, RVT  Examination Guidelines: A complete evaluation includes B-mode imaging, spectral Doppler, color Doppler, and power Doppler as  needed of all accessible portions of each vessel. Bilateral testing is considered an integral part of a complete examination. Limited examinations for reoccurring indications may be performed as noted.   +-----------+--------+-----+---------------+----------+----------------+ LEFT       PSV cm/sRatioStenosis       Waveform  Comments         +-----------+--------+-----+---------------+----------+----------------+ CFA Prox   118                         triphasic                  +-----------+--------+-----+---------------+----------+----------------+ DFA        62                          biphasic                   +-----------+--------+-----+---------------+----------+----------------+ SFA Prox   147          30-49% stenosisbiphasic                   +-----------+--------+-----+---------------+----------+----------------+ SFA Mid    194          30-49% stenosisbiphasic                   +-----------+--------+-----+---------------+----------+----------------+ SFA Distal  73                                                     +-----------+--------+-----+---------------+----------+----------------+ POP Prox   91                                                     +-----------+--------+-----+---------------+----------+----------------+ POP Distal 93                                                     +-----------+--------+-----+---------------+----------+----------------+ ATA Prox   28                          biphasic                   +-----------+--------+-----+---------------+----------+----------------+ ATA Distal 0                                     appears occluded +-----------+--------+-----+---------------+----------+----------------+ PTA Prox   82                          monophasic                 +-----------+--------+-----+---------------+----------+----------------+ PTA Mid    183          30-49% stenosismonophasic                 +-----------+--------+-----+---------------+----------+----------------+ PTA Distal 145  monophasic                 +-----------+--------+-----+---------------+----------+----------------+ PERO Prox  72                          monophasic                 +-----------+--------+-----+---------------+----------+----------------+ PERO Mid   62                                                     +-----------+--------+-----+---------------+----------+----------------+ PERO Distal93                                                     +-----------+--------+-----+---------------+----------+----------------+  Summary: Left: 30-49% stenosis noted in the superficial femoral artery. 30-49% stenosis noted in the posterior tibial artery. Total occlusion of the distal anterior tibial artery.  See table(s) above for measurements and observations. Electronically signed by Jamelle Haring on 11/26/2022 at 5:59:53 PM.    Final         Scheduled Meds:   atorvastatin  80 mg Oral Daily   [START ON 11/29/2022] influenza vac split quadrivalent PF  0.5 mL Intramuscular Tomorrow-1000   insulin aspart  0-15 Units Subcutaneous TID WC   insulin aspart  0-5 Units Subcutaneous QHS   insulin glargine-yfgn  25 Units Subcutaneous Daily   metoprolol succinate  75 mg Oral Daily   Continuous Infusions:  cefTRIAXone (ROCEPHIN)  IV 2 g (11/26/22 1654)   lactated ringers 50 mL/hr at 11/26/22 0838   linezolid (ZYVOX) IV 600 mg (11/26/22 2118)   metronidazole 500 mg (11/27/22 0618)     LOS: 1 day    Time spent: 35 minutes    Arah Aro A Belanna Manring, MD [facility]   If 7PM-7AM, please contact night-coverage [email or web address]  11/27/2022, 7:06 AM
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
## 2022-11-27 NOTE — Progress Notes (Signed)
Initial Nutrition Assessment  DOCUMENTATION CODES:   Obesity unspecified  INTERVENTION:   -1 packet Juven BID, each packet provides 95 calories, 2.5 grams of protein (collagen), and 9.8 grams of carbohydrate (3 grams sugar); also contains 7 grams of L-arginine and L-glutamine, 300 mg vitamin C, 15 mg vitamin E, 1.2 mcg vitamin B-12, 9.5 mg zinc, 200 mg calcium, and 1.5 g  Calcium Beta-hydroxy-Beta-methylbutyrate to support wound healing  -Ensure Max po daily, each supplement provides 150 kcal and 30 grams of protein -MVI with minerals daily -500 mg vitamin C BID -220 mg zinc sulfate daily x 14 days -Magic cup TID with meals, each supplement provides 290 kcal and 9 grams of protein  -Given pt's chronic non-healing wound, will check labs to assess for potential micronutrient deficiencies: vitamin C, zinc, and vitamin A  NUTRITION DIAGNOSIS:   Increased nutrient needs related to wound healing as evidenced by estimated needs.  GOAL:   Patient will meet greater than or equal to 90% of their needs  MONITOR:   PO intake, Supplement acceptance  REASON FOR ASSESSMENT:   Consult Assessment of nutrition requirement/status, Wound healing  ASSESSMENT:   with medical history significant for poorly healing wound to the left lateral foot, type 2 diabetes mellitus complicated by diabetic peripheral polyneuropathy, chronic diastolic heart failure, CKD 3B with baseline creatinine range 2-2.4, essential hypertension, hyperlipidemia, who is admitted to [facility] on 11/25/2022 with nonhealing wound of the left foot  Pt admitted with non-healing wound to lt lateral foot.   Reviewed I/O's: -675 ml x 24 hours  UOP: 675 ml x 24 hours   Pt unavailable at time of visit. Attempted to speak with pt via call to hospital room phone, however, unable to reach. RD unable to obtain further nutrition-related history or complete nutrition-focused physical exam at this time.    Per vascular surgery  notes, plan for angiography; studies suggestive of good blood flow. Per podiatry, pt is at high risk for BKA.   Pt currently on a carb modified diet. No meal completion data documented to assess at this time.   Reviewed wt hx; pt has experienced a 8.2% wt loss over the past 3 months, which is significant for time frame. Per previous RD notes, pt was identified with severe malnutrition; suspect this is ongoing, however, unable to identify at this time. Pt would greatly benefit from addition of oral nutrition supplements.   Of note, during previous admission, pt reported multiple social stressors, including living in a home without access to a refrigerator or cooking appliances as well as lack of transportation.   Medications reviewed and include lactated ringers infusion @ 75 ml/hr.   Lab Results  Component Value Date   HGBA1C 7.6 (A) 08/26/2022   PTA DM medications are 10 mg farxiga daily and 5 units lantus solostar BID.   Labs reviewed: CBGS: 200 (inpatient orders for glycemic control are 0-15 units insulin aspart TID with meals, 0-5 units insulin aspart daily at bedtime, and 25 units insulin glargine-yfgn daily).    Diet Order:   Diet Order             Diet Carb Modified Fluid consistency: Thin; Room service appropriate? Yes  Diet effective now                   EDUCATION NEEDS:   No education needs have been identified at this time  Skin:  Skin Assessment: Skin Integrity Issues: Skin Integrity Issues:: Other (Comment) Other: non healing  lt foot wound  Last BM:  11/26/22  Height:   Ht Readings from Last 1 Encounters:  11/26/22 $RemoveB'5\' 9"'OjqtpNnA$  (1.753 m)    Weight:   Wt Readings from Last 1 Encounters:  11/26/22 122.5 kg    Ideal Body Weight:  72.7 kg  BMI:  Body mass index is 39.87 kg/m.  Estimated Nutritional Needs:   Kcal:  2150-2350  Protein:  105-120 grams  Fluid:  > 2 L    Loistine Chance, RD, LDN, [place] Registered Dietitian II Certified Diabetes Care and  Education Specialist Please refer to [facility] for RD and/or RD on-call/weekend/after hours pager

## 2022-11-28 DIAGNOSIS — R079 Chest pain, unspecified: Secondary | ICD-10-CM | POA: Diagnosis not present

## 2022-11-28 DIAGNOSIS — S81802A Unspecified open wound, left lower leg, initial encounter: Secondary | ICD-10-CM | POA: Diagnosis not present

## 2022-11-28 DIAGNOSIS — Z01810 Encounter for preprocedural cardiovascular examination: Secondary | ICD-10-CM | POA: Diagnosis not present

## 2022-11-28 LAB — CBC
HCT: 33.6 % — ABNORMAL LOW (ref 39.0–52.0)
Hemoglobin: 10.8 g/dL — ABNORMAL LOW (ref 13.0–17.0)
MCH: 27.6 pg (ref 26.0–34.0)
MCHC: 32.1 g/dL (ref 30.0–36.0)
MCV: 85.9 fL (ref 80.0–100.0)
Platelets: 228 10*3/uL (ref 150–400)
RBC: 3.91 MIL/uL — ABNORMAL LOW (ref 4.22–5.81)
RDW: 18.1 % — ABNORMAL HIGH (ref 11.5–15.5)
WBC: 6.6 10*3/uL (ref 4.0–10.5)
nRBC: 0 % (ref 0.0–0.2)

## 2022-11-28 LAB — BASIC METABOLIC PANEL
Anion gap: 11 (ref 5–15)
BUN: 47 mg/dL — ABNORMAL HIGH (ref 8–23)
CO2: 23 mmol/L (ref 22–32)
Calcium: 8.6 mg/dL — ABNORMAL LOW (ref 8.9–10.3)
Chloride: 100 mmol/L (ref 98–111)
Creatinine, Ser: 1.97 mg/dL — ABNORMAL HIGH (ref 0.61–1.24)
GFR, Estimated: 37 mL/min — ABNORMAL LOW (ref >60)
Glucose, Bld: 204 mg/dL — ABNORMAL HIGH (ref 70–99)
Potassium: 3.8 mmol/L (ref 3.5–5.1)
Sodium: 134 mmol/L — ABNORMAL LOW (ref 135–145)

## 2022-11-28 LAB — GLUCOSE, CAPILLARY
Glucose-Capillary: 211 mg/dL — ABNORMAL HIGH (ref 70–99)
Glucose-Capillary: 327 mg/dL — ABNORMAL HIGH (ref 70–99)
Glucose-Capillary: 239 mg/dL — ABNORMAL HIGH (ref 70–99)
Glucose-Capillary: 186 mg/dL — ABNORMAL HIGH (ref 70–99)
Glucose-Capillary: 221 mg/dL — ABNORMAL HIGH (ref 70–99)

## 2022-11-28 MED ORDER — INSULIN ASPART 100 UNIT/ML IJ SOLN: 5.0000 [IU] | Freq: Three times a day (TID) | INTRAMUSCULAR | Status: DC

## 2022-11-28 MED ORDER — ENOXAPARIN SODIUM 60 MG/0.6ML IJ SOSY
60.0000 mg | PREFILLED_SYRINGE | INTRAMUSCULAR | Status: DC
  Administered 2022-11-28 – 2022-12-09 (×10): 60 mg via SUBCUTANEOUS
  Filled 2022-11-28 (×11): qty 0.6

## 2022-11-28 MED ORDER — INSULIN ASPART 100 UNIT/ML IJ SOLN
5.0000 [IU] | Freq: Three times a day (TID) | INTRAMUSCULAR | Status: DC
  Administered 2022-11-28 – 2022-12-02 (×10): 5 [IU] via SUBCUTANEOUS

## 2022-11-28 MED ORDER — HEPARIN SODIUM (PORCINE) 5000 UNIT/ML IJ SOLN: 5000.0000 [IU] | Freq: Three times a day (TID) | INTRAMUSCULAR | Status: DC

## 2022-11-28 MED ORDER — ENOXAPARIN SODIUM 40 MG/0.4ML IJ SOSY: 40.0000 mg | PREFILLED_SYRINGE | INTRAMUSCULAR | Status: DC

## 2022-11-28 MED ORDER — INSULIN GLARGINE-YFGN 100 UNIT/ML ~~LOC~~ SOLN
30.0000 [IU] | Freq: Every day | SUBCUTANEOUS | Status: DC
  Administered 2022-11-29 – 2022-12-07 (×8): 30 [IU] via SUBCUTANEOUS
  Filled 2022-11-28 (×10): qty 0.3

## 2022-11-28 NOTE — Progress Notes (Signed)
Progress Note  Patient Name: Joshua [name] Date of Encounter: 11/28/2022  Primary Cardiologist: [name], MD  Subjective   No acute events overnight.  No further episodes of chest pain after starting Imdur 180 mg.  Inpatient Medications    Scheduled Meds:  ascorbic acid  500 mg Oral BID   aspirin EC  81 mg Oral Daily   atorvastatin  80 mg Oral Daily   enoxaparin (LOVENOX) injection  60 mg Subcutaneous Q24H   gabapentin  100 mg Oral QHS   [START ON 11/29/2022] influenza vac split quadrivalent PF  0.5 mL Intramuscular Tomorrow-1000   insulin aspart  0-15 Units Subcutaneous TID WC   insulin aspart  0-5 Units Subcutaneous QHS   insulin aspart  5 Units Subcutaneous TID WC   insulin glargine-yfgn  10 Units Subcutaneous QHS   [START ON 11/29/2022] insulin glargine-yfgn  30 Units Subcutaneous Daily   isosorbide mononitrate  180 mg Oral Daily   metoprolol succinate  75 mg Oral Daily   multivitamin with minerals  1 tablet Oral Daily   nutrition supplement (JUVEN)  1 packet Oral BID BM   Ensure Max Protein  11 oz Oral QHS   zinc sulfate  220 mg Oral Daily   Continuous Infusions:  cefTRIAXone (ROCEPHIN)  IV Stopped (11/27/22 1834)   lactated ringers Stopped (11/28/22 0416)   linezolid (ZYVOX) IV 600 mg (11/28/22 0922)   metronidazole 100 mL/hr at 11/28/22 0500   PRN Meds: acetaminophen **OR** acetaminophen, fentaNYL (SUBLIMAZE) injection, melatonin, naLOXone (NARCAN)  injection, nitroGLYCERIN, ondansetron (ZOFRAN) IV, oxyCODONE-acetaminophen   Vital Signs    Vitals:   11/27/22 1559 11/27/22 2020 11/28/22 0526 11/28/22 0746  BP: (!) 142/91 106/64 121/66 (!) 109/51  Pulse: 78 73 80 74  Resp: $Remo'16 18 18 18  'jbRRZ$ Temp: 97.6 F (36.4 C) 98 F (36.7 C) 97.8 F (36.6 C) 98 F (36.7 C)  TempSrc: Oral Oral Oral Oral  SpO2: 100% 100% 100% 99%  Weight:      Height:        Intake/Output Summary (Last 24 hours) at 11/28/2022 1150 Last data filed at 11/28/2022 0933 Gross per  24 hour  Intake 3662.75 ml  Output 2150 ml  Net 1512.75 ml   Filed Weights   11/26/22 1655  Weight: 122.5 kg    Telemetry    Patient is not on telemetry  ECG  NSR with first-degree AV block  Physical Exam   GEN: No acute distress.   Neck: No JVD. Cardiac: RRR, no murmur, rub, or gallop.  Respiratory: Nonlabored. Clear to auscultation bilaterally. GI: Soft, nontender, bowel sounds present. MS: No edema; R metatarsal amputation Neuro:  Nonfocal. Psych: Alert and oriented x 3. Normal affect.  Labs    Chemistry Recent Labs  Lab 11/26/22 0411 11/27/22 0937 11/28/22 0201  NA 137 135 134*  K 3.7 3.8 3.8  CL 104 105 100  CO2 21* 21* 23  GLUCOSE 166* 219* 204*  BUN 50* 40* 47*  CREATININE 2.48* 2.06* 1.97*  CALCIUM 9.0 8.8* 8.6*  PROT 7.6  --   --   ALBUMIN 2.4*  --   --   AST 21  --   --   ALT 16  --   --   ALKPHOS 130*  --   --   BILITOT 0.5  --   --   GFRNONAA 28* 35* 37*  ANIONGAP $RemoveB'12 9 11     'IuqgGzoj$ Hematology Recent Labs  Lab 11/26/22 0411 11/27/22 1583 11/28/22  0201  WBC 9.9 8.9 6.6  RBC 4.20* 4.30 3.91*  HGB 11.7* 12.0* 10.8*  HCT 36.5* 37.7* 33.6*  MCV 86.9 87.7 85.9  MCH 27.9 27.9 27.6  MCHC 32.1 31.8 32.1  RDW 18.6* 18.6* 18.1*  PLT 281 288 228    Cardiac Enzymes Recent Labs  Lab 11/27/22 1331 11/27/22 1537  TROPONINIHS 35* 40*    BNPNo results for input(s): "BNP", "PROBNP" in the last 168 hours.   DDimerNo results for input(s): "DDIMER" in the last 168 hours.   Radiology    No results found.   Assessment & Plan    Patient is a 66 year old M known to have CAD status post multiple PCI (last PCI in 2020), HTN, DM 2, HLD, PAD s/p R metatarsal amputation in 2017, CKD 4, OSA presented to the hospital directly from podiatry clinic for nonhealing wound of left foot in preparation for L BKA.  Cardiology was consulted for chest pain episode and preop cardiac risk ratification.  # Cardiac chest pain -Patient had 3 chest pain episodes in the  last 6 to 8 months.  The last episode of chest pain occurred on 11/27/2022 at rest lasting for 10 minutes and severe intensity.  No further episodes of chest pain after starting antianginal medications.  Continue Imdur and metoprolol at current doses.  I do not anticipate increasing his medication doses at this time.  Indication for further cardiac testing at this time. -Continue aspirin 81 mg once daily.  Brilinta can be resumed if there is no plan for L BKA.  # Preop cardiac risk stratification for LE BKA -Patient is undergoing peripheral angiogram with CO2 contrast on 11/29/2022.  If he is planned for L BKA in the future, he will be at increased risk for perioperative cardiac complications.  No further cardiac testing is indicated at this time prior to proceeding with the planned procedure.  [facility] will sign off.   Medication Recommendations: Aspirin 81 mg once daily, atorvastatin 80 mg nightly, Imdur 180 mg once daily, metoprolol succinate 75 mg once daily.  Resume Brilinta if no plans for L BKA. Other recommendations (labs, testing, etc): None Follow up as an outpatient: Cardiology outpatient follow-up   I have spent a total of 33 minutes with patient reviewing chart , telemetry, EKGs, labs and examining patient as well as establishing an assessment and plan that was discussed with the patient.  > 50% of time was spent in direct patient care.     Signed, Chalmers Guest, MD  11/28/2022, 11:50 AM

## 2022-11-28 NOTE — Progress Notes (Signed)
VASC[facility] VEIN SPECIALISTS OF [place] PROGRESS NOTE  ASSESSMENT / PLAN: Joshua [name] is a 66 y.o. male with extensive left lower extremity ulceration. Non-invasive studies are reassuring, but patient is at high risk for limb loss based on size and location of ulcer. Kidney function improving. Scr 1.97 today. Plan angiography tomorrow with CO2 contrast. NPO after midnight. Orders for consent written.  SUBJECTIVE: No complaints.    OBJECTIVE: BP (!) 109/51 (BP Location: Right Arm)   Pulse 74   Temp 98 F (36.7 C) (Oral)   Resp 18   Ht $R'5\' 9"'Zz$  (1.753 m)   Wt 122.5 kg   SpO2 99%   BMI 39.87 kg/m   Intake/Output Summary (Last 24 hours) at 11/28/2022 1013 Last data filed at 11/28/2022 0933 Gross per 24 hour  Intake 3662.75 ml  Output 2150 ml  Net 1512.75 ml    No distress Clean bandage on L foot     Latest Ref Rng & Units 11/28/2022    2:01 AM 11/27/2022    9:37 AM 11/26/2022    4:11 AM  CBC  WBC 4.0 - 10.5 K/uL 6.6  8.9  9.9   Hemoglobin 13.0 - 17.0 g/dL 10.8  12.0  11.7   Hematocrit 39.0 - 52.0 % 33.6  37.7  36.5   Platelets 150 - 400 K/uL 228  288  281         Latest Ref Rng & Units 11/28/2022    2:01 AM 11/27/2022    9:37 AM 11/26/2022    4:11 AM  CMP  Glucose 70 - 99 mg/dL 204  219  166   BUN 8 - 23 mg/dL 47  40  50   Creatinine 0.61 - 1.24 mg/dL 1.97  2.06  2.48   Sodium 135 - 145 mmol/L 134  135  137   Potassium 3.5 - 5.1 mmol/L 3.8  3.8  3.7   Chloride 98 - 111 mmol/L 100  105  104   CO2 22 - 32 mmol/L $RemoveB'23  21  21   'ZmnWYLAI$ Calcium 8.9 - 10.3 mg/dL 8.6  8.8  9.0   Total Protein 6.5 - 8.1 g/dL   7.6   Total Bilirubin 0.3 - 1.2 mg/dL   0.5   Alkaline Phos 38 - 126 U/L   130   AST 15 - 41 U/L   21   ALT 0 - 44 U/L   16     Estimated Creatinine Clearance: 49 mL/min (A) (by C-G formula based on SCr of 1.97 mg/dL (H)).  Joshua [name]. Joshua Breed, MD [facility] of [facility] Phone Number: [phone] 11/28/2022 10:13 AM

## 2022-11-28 NOTE — H&P (View-Only) (Signed)
VASCULAR [facility] VEIN SPECIALISTS OF [place] PROGRESS NOTE  ASSESSMENT / PLAN: Joshua [name] is a 66 y.o. male with extensive left lower extremity ulceration. Non-invasive studies are reassuring, but patient is at high risk for limb loss based on size and location of ulcer. Kidney function improving. Scr 1.97 today. Plan angiography tomorrow with CO2 contrast. NPO after midnight. Orders for consent written.  SUBJECTIVE: No complaints.    OBJECTIVE: BP (!) 109/51 (BP Location: Right Arm)   Pulse 74   Temp 98 F (36.7 C) (Oral)   Resp 18   Ht $R'5\' 9"'SK$  (1.753 m)   Wt 122.5 kg   SpO2 99%   BMI 39.87 kg/m   Intake/Output Summary (Last 24 hours) at 11/28/2022 1013 Last data filed at 11/28/2022 0933 Gross per 24 hour  Intake 3662.75 ml  Output 2150 ml  Net 1512.75 ml    No distress Clean bandage on L foot     Latest Ref Rng & Units 11/28/2022    2:01 AM 11/27/2022    9:37 AM 11/26/2022    4:11 AM  CBC  WBC 4.0 - 10.5 K/uL 6.6  8.9  9.9   Hemoglobin 13.0 - 17.0 g/dL 10.8  12.0  11.7   Hematocrit 39.0 - 52.0 % 33.6  37.7  36.5   Platelets 150 - 400 K/uL 228  288  281         Latest Ref Rng & Units 11/28/2022    2:01 AM 11/27/2022    9:37 AM 11/26/2022    4:11 AM  CMP  Glucose 70 - 99 mg/dL 204  219  166   BUN 8 - 23 mg/dL 47  40  50   Creatinine 0.61 - 1.24 mg/dL 1.97  2.06  2.48   Sodium 135 - 145 mmol/L 134  135  137   Potassium 3.5 - 5.1 mmol/L 3.8  3.8  3.7   Chloride 98 - 111 mmol/L 100  105  104   CO2 22 - 32 mmol/L $RemoveB'23  21  21   'UJUNYQpp$ Calcium 8.9 - 10.3 mg/dL 8.6  8.8  9.0   Total Protein 6.5 - 8.1 g/dL   7.6   Total Bilirubin 0.3 - 1.2 mg/dL   0.5   Alkaline Phos 38 - 126 U/L   130   AST 15 - 41 U/L   21   ALT 0 - 44 U/L   16     Estimated Creatinine Clearance: 49 mL/min (A) (by C-G formula based on SCr of 1.97 mg/dL (H)).  [name]. Stanford Breed, MD [facility] Specialists of [facility] Phone Number: [phone] 11/28/2022 10:13 AM

## 2022-11-28 NOTE — Progress Notes (Signed)
PROGRESS NOTE    Joshua [name]  RDE:[id number] DOB: 12-Feb-1956 DOA: 11/25/2022 PCP: [name], MD   Brief Narrative: 66 year old with past medical history significant for poorly healing wound of the left lateral foot, diabetes type 2 complicated by diabetic poorly neuropathy, chronic diastolic heart failure, CKD stage IIIb, creatinine baseline 2--2.4, hypertension, hyperlipidemia who presents with nonhealing wound of the left foot after refer for admission from podiatry clinic.  Dr. Crist Fat for podiatry is recommending left BKA.  Vascular consulted, plan for angiogram with CO2 contrast 12/18   Assessment & Plan:   Principal Problem:   Non-healing wound of left lower extremity Active Problems:   Hyperlipidemia   Chronic diastolic CHF (congestive heart failure) (HCC)   Stage 3b chronic kidney disease (CKD) (HCC)   DM2 (diabetes mellitus, type 2) ([place])   Essential hypertension   1-Nonhealing wound of the lateral aspect of the left foot:, complicated by osteomyelitis: -Status postdebridement by podiatry on 11/06/2022. -He has been on oral antibiotics and wound VAC as an outpatient but wound continued  not improve. -ABI done in 11/02/2022: -Podiatry is recommending left BKA. -Vascular recommend initiation of antibiotics. Started Linezolid, ceftriaxone, flagyl.  -Plan for arteriogram tomorrow.   2-Diabetes type 2: Hyperglycemia.  -Diabetic neuropathy -Recent A1c 7.6. -Continue sliding scale insulin -increase Semglee 30 units am and 10 units HS> . Increase meals coverage to 5 units.  -Resume gabapentin.   3-Chronic diastolic heart failure: Last echo 08/2021 ejection fraction 55 to 63% grade 2 diastolic dysfunction. Hold lasix.  Monitor volume status.  CKD stage IIIb: Creatinine range 2.0--2.4 Cr down to 1.9, better than baseline   CAD; History stent placement LAD; Develops chest pain  12/16 Resolved with nitroglycerin.  Troponin mildly elevated at 40 Discussed with  vascular ok to resume aspirin and brilinta.  Cardiology consulted. Discussed with cardio ok to hold brilinta, in anticipation of BKA>  On metoprolol and statins.  Continue with Imdur.   Hypertension:  PRN hydralazine.  Continue with Metoprolol, Imdur.   Hyperlipidemia: Continue with statins.  Vascular diseases:  ABI : 11/02/2022 ABI/TBIToday's ABIToday's TBIPrevious ABIPrevious TBI  +-------+-----------+-----------+------------+------------+  Right 1.28       amp.       0.90                      +-------+-----------+-----------+------------+------------+  Left  1.15       0.70       1.05     Vascular consulted.  Plan for arteriogram tomorrow       Estimated body mass index is 39.87 kg/m as calculated from the following:   Height as of this encounter: $RemoveBeforeD'5\' 9"'kBrktSzLIPfgMP$  (1.753 m).   Weight as of this encounter: 122.5 kg.   DVT prophylaxis: SCD Code Status: Full code Family Communication: care discussed with patient Disposition Plan:  Status is: Inpatient Remains inpatient appropriate because:     Consultants:  Vascular   Procedures:    Antimicrobials:    Subjective: He is chest pain free this am. Feels better.   Objective: Vitals:   11/27/22 1559 11/27/22 2020 11/28/22 0526 11/28/22 0746  BP: (!) 142/91 106/64 121/66 (!) 109/51  Pulse: 78 73 80 74  Resp: $Remo'16 18 18 18  'VFFDg$ Temp: 97.6 F (36.4 C) 98 F (36.7 C) 97.8 F (36.6 C) 98 F (36.7 C)  TempSrc: Oral Oral Oral Oral  SpO2: 100% 100% 100% 99%  Weight:      Height:  Intake/Output Summary (Last 24 hours) at 11/28/2022 1148 Last data filed at 11/28/2022 4696 Gross per 24 hour  Intake 3662.75 ml  Output 2150 ml  Net 1512.75 ml   Filed Weights   11/26/22 1655  Weight: 122.5 kg    Examination:  General exam: NAD Respiratory system: CTA Cardiovascular system: S 1, S 2 RRR Gastrointestinal system: BS present, soft, nt Central nervous system:Alert, follows command Extremities: no  edema, dressing left foot, open wound.   Data Reviewed: I have personally reviewed following labs and imaging studies  CBC: Recent Labs  Lab 11/25/22 1450 11/26/22 0411 11/27/22 0937 11/28/22 0201  WBC 11.6* 9.9 8.9 6.6  NEUTROABS 8.5* 7.1  --   --   HGB 12.1* 11.7* 12.0* 10.8*  HCT 39.1 36.5* 37.7* 33.6*  MCV 89.5 86.9 87.7 85.9  PLT 312 281 288 295    Basic Metabolic Panel: Recent Labs  Lab 11/25/22 1450 11/26/22 0411 11/27/22 0937 11/28/22 0201  NA 138 137 135 134*  K 4.1 3.7 3.8 3.8  CL 106 104 105 100  CO2 22 21* 21* 23  GLUCOSE 154* 166* 219* 204*  BUN 45* 50* 40* 47*  CREATININE 2.30* 2.48* 2.06* 1.97*  CALCIUM 9.0 9.0 8.8* 8.6*  MG  --  2.4  --   --     GFR: Estimated Creatinine Clearance: 49 mL/min (A) (by C-G formula based on SCr of 1.97 mg/dL (H)). Liver Function Tests: Recent Labs  Lab 11/26/22 0411  AST 21  ALT 16  ALKPHOS 130*  BILITOT 0.5  PROT 7.6  ALBUMIN 2.4*    No results for input(s): "LIPASE", "AMYLASE" in the last 168 hours. No results for input(s): "AMMONIA" in the last 168 hours. Coagulation Profile: Recent Labs  Lab 11/26/22 0411  INR 1.2    Cardiac Enzymes: No results for input(s): "CKTOTAL", "CKMB", "CKMBINDEX", "TROPONINI" in the last 168 hours. BNP (last 3 results) No results for input(s): "PROBNP" in the last 8760 hours. HbA1C: No results for input(s): "HGBA1C" in the last 72 hours. CBG: Recent Labs  Lab 11/27/22 1145 11/27/22 1556 11/27/22 2055 11/28/22 0743 11/28/22 1138  GLUCAP 335* 260* 174* 211* 327*    Lipid Profile: No results for input(s): "CHOL", "HDL", "LDLCALC", "TRIG", "CHOLHDL", "LDLDIRECT" in the last 72 hours. Thyroid Function Tests: No results for input(s): "TSH", "T4TOTAL", "FREET4", "T3FREE", "THYROIDAB" in the last 72 hours. Anemia Panel: No results for input(s): "VITAMINB12", "FOLATE", "FERRITIN", "TIBC", "IRON", "RETICCTPCT" in the last 72 hours. Sepsis Labs: No results for input(s):  "PROCALCITON", "LATICACIDVEN" in the last 168 hours.  No results found for this or any previous visit (from the past 240 hour(s)).       Radiology Studies: No results found.      Scheduled Meds:  ascorbic acid  500 mg Oral BID   aspirin EC  81 mg Oral Daily   atorvastatin  80 mg Oral Daily   enoxaparin (LOVENOX) injection  60 mg Subcutaneous Q24H   gabapentin  100 mg Oral QHS   [START ON 11/29/2022] influenza vac split quadrivalent PF  0.5 mL Intramuscular Tomorrow-1000   insulin aspart  0-15 Units Subcutaneous TID WC   insulin aspart  0-5 Units Subcutaneous QHS   insulin aspart  4 Units Subcutaneous TID WC   insulin glargine-yfgn  10 Units Subcutaneous QHS   insulin glargine-yfgn  25 Units Subcutaneous Daily   isosorbide mononitrate  180 mg Oral Daily   metoprolol succinate  75 mg Oral Daily   multivitamin with  minerals  1 tablet Oral Daily   nutrition supplement (JUVEN)  1 packet Oral BID BM   Ensure Max Protein  11 oz Oral QHS   zinc sulfate  220 mg Oral Daily   Continuous Infusions:  cefTRIAXone (ROCEPHIN)  IV Stopped (11/27/22 1834)   lactated ringers Stopped (11/28/22 0416)   linezolid (ZYVOX) IV 600 mg (11/28/22 0922)   metronidazole 100 mL/hr at 11/28/22 0500     LOS: 2 days    Time spent: 35 minutes    Marvena Tally A Ginelle Bays, MD [facility]   If 7PM-7AM, please contact night-coverage [email or web address]  11/28/2022, 11:48 AM

## 2022-11-29 ENCOUNTER — Inpatient Hospital Stay (HOSPITAL_COMMUNITY): Payer: Medicare Other

## 2022-11-29 ENCOUNTER — Inpatient Hospital Stay (HOSPITAL_COMMUNITY)
Admission: EM | Disposition: A | Discharge status: Skilled Nursing Facility | Payer: Self-pay | Source: Home / Self Care | Attending: Family Medicine

## 2022-11-29 ENCOUNTER — Encounter (HOSPITAL_COMMUNITY): Payer: Self-pay | Admitting: Vascular Surgery

## 2022-11-29 DIAGNOSIS — S81802A Unspecified open wound, left lower leg, initial encounter: Secondary | ICD-10-CM | POA: Diagnosis not present

## 2022-11-29 DIAGNOSIS — M86672 Other chronic osteomyelitis, left ankle and foot: Secondary | ICD-10-CM | POA: Diagnosis not present

## 2022-11-29 DIAGNOSIS — I70245 Atherosclerosis of native arteries of left leg with ulceration of other part of foot: Secondary | ICD-10-CM

## 2022-11-29 HISTORY — PX: ABDOMINAL AORTOGRAM W/LOWER EXTREMITY: CATH118223

## 2022-11-29 LAB — BASIC METABOLIC PANEL
Anion gap: 7 (ref 5–15)
BUN: 48 mg/dL — ABNORMAL HIGH (ref 8–23)
CO2: 22 mmol/L (ref 22–32)
Calcium: 8.6 mg/dL — ABNORMAL LOW (ref 8.9–10.3)
Chloride: 106 mmol/L (ref 98–111)
Creatinine, Ser: 1.8 mg/dL — ABNORMAL HIGH (ref 0.61–1.24)
GFR, Estimated: 42 mL/min — ABNORMAL LOW (ref >60)
Glucose, Bld: 230 mg/dL — ABNORMAL HIGH (ref 70–99)
Potassium: 4 mmol/L (ref 3.5–5.1)
Sodium: 135 mmol/L (ref 135–145)

## 2022-11-29 LAB — CBC
HCT: 34.2 % — ABNORMAL LOW (ref 39.0–52.0)
Hemoglobin: 10.8 g/dL — ABNORMAL LOW (ref 13.0–17.0)
MCH: 28 pg (ref 26.0–34.0)
MCHC: 31.6 g/dL (ref 30.0–36.0)
MCV: 88.6 fL (ref 80.0–100.0)
Platelets: 232 10*3/uL (ref 150–400)
RBC: 3.86 MIL/uL — ABNORMAL LOW (ref 4.22–5.81)
RDW: 18 % — ABNORMAL HIGH (ref 11.5–15.5)
WBC: 6.1 10*3/uL (ref 4.0–10.5)
nRBC: 0 % (ref 0.0–0.2)

## 2022-11-29 LAB — GLUCOSE, CAPILLARY
Glucose-Capillary: 187 mg/dL — ABNORMAL HIGH (ref 70–99)
Glucose-Capillary: 205 mg/dL — ABNORMAL HIGH (ref 70–99)

## 2022-11-29 SURGERY — ABDOMINAL AORTOGRAM W/LOWER EXTREMITY
Anesthesia: LOCAL

## 2022-11-29 MED ORDER — MIDAZOLAM HCL 2 MG/2ML IJ SOLN
INTRAMUSCULAR | Status: DC | PRN
  Administered 2022-11-29: 2 mg via INTRAVENOUS

## 2022-11-29 MED ORDER — LIDOCAINE HCL (PF) 1 % IJ SOLN
INTRAMUSCULAR | Status: AC
  Filled 2022-11-29: qty 30

## 2022-11-29 MED ORDER — ONDANSETRON HCL 4 MG/2ML IJ SOLN
4.0000 mg | Freq: Four times a day (QID) | INTRAMUSCULAR | Status: DC | PRN
  Filled 2022-11-29: qty 2

## 2022-11-29 MED ORDER — SODIUM CHLORIDE 0.9 % IV SOLN: INTRAVENOUS | Status: AC

## 2022-11-29 MED ORDER — HYDRALAZINE HCL 20 MG/ML IJ SOLN: 5.0000 mg | INTRAMUSCULAR | Status: DC | PRN

## 2022-11-29 MED ORDER — MIDAZOLAM HCL 2 MG/2ML IJ SOLN
INTRAMUSCULAR | Status: AC
  Filled 2022-11-29: qty 2

## 2022-11-29 MED ORDER — SODIUM CHLORIDE 0.9% FLUSH
3.0000 mL | Freq: Two times a day (BID) | INTRAVENOUS | Status: DC
  Administered 2022-11-29 – 2022-12-03 (×8): 3 mL via INTRAVENOUS

## 2022-11-29 MED ORDER — FENTANYL CITRATE (PF) 100 MCG/2ML IJ SOLN
INTRAMUSCULAR | Status: DC | PRN
  Administered 2022-11-29: 50 ug via INTRAVENOUS

## 2022-11-29 MED ORDER — LIDOCAINE HCL (PF) 1 % IJ SOLN
INTRAMUSCULAR | Status: DC | PRN
  Administered 2022-11-29: 5 mL via SUBCUTANEOUS

## 2022-11-29 MED ORDER — ACETAMINOPHEN 325 MG PO TABS: 650.0000 mg | ORAL_TABLET | ORAL | Status: DC | PRN

## 2022-11-29 MED ORDER — HEPARIN (PORCINE) IN NACL 1000-0.9 UT/500ML-% IV SOLN
INTRAVENOUS | Status: AC
  Filled 2022-11-29: qty 500

## 2022-11-29 MED ORDER — SODIUM CHLORIDE 0.9 % IV SOLN
250.0000 mL | INTRAVENOUS | Status: DC | PRN
  Administered 2022-11-29 – 2022-11-30 (×2): 250 mL via INTRAVENOUS

## 2022-11-29 MED ORDER — FENTANYL CITRATE (PF) 100 MCG/2ML IJ SOLN
INTRAMUSCULAR | Status: AC
  Filled 2022-11-29: qty 2

## 2022-11-29 MED ORDER — SODIUM CHLORIDE 0.9% FLUSH: 3.0000 mL | INTRAVENOUS | Status: DC | PRN

## 2022-11-29 MED ORDER — LABETALOL HCL 5 MG/ML IV SOLN: 10.0000 mg | INTRAVENOUS | Status: DC | PRN

## 2022-11-29 MED ORDER — CHLORHEXIDINE GLUCONATE CLOTH 2 % EX PADS
6.0000 | MEDICATED_PAD | Freq: Once | CUTANEOUS | Status: AC
  Administered 2022-11-30: 6 via TOPICAL

## 2022-11-29 MED ORDER — CHLORHEXIDINE GLUCONATE CLOTH 2 % EX PADS
6.0000 | MEDICATED_PAD | Freq: Once | CUTANEOUS | Status: AC
  Administered 2022-11-29: 6 via TOPICAL

## 2022-11-29 MED ORDER — IODIXANOL 320 MG/ML IV SOLN
INTRAVENOUS | Status: DC | PRN
  Administered 2022-11-29: 20 mL via INTRA_ARTERIAL

## 2022-11-29 MED ORDER — SODIUM CHLORIDE 0.9 % IV SOLN: INTRAVENOUS | Status: DC

## 2022-11-29 MED ORDER — OXYCODONE HCL 5 MG PO TABS
5.0000 mg | ORAL_TABLET | ORAL | Status: DC | PRN
  Administered 2022-11-29 – 2022-12-02 (×2): 10 mg via ORAL
  Administered 2022-12-09: 5 mg via ORAL
  Filled 2022-11-29: qty 1
  Filled 2022-11-29 (×2): qty 2

## 2022-11-29 SURGICAL SUPPLY — 13 items
CATH OMNI FLUSH 5F 65CM (CATHETERS) IMPLANT
CATH TEMPO AQUA 5F 100CM (CATHETERS) IMPLANT
CLOSURE MYNX CONTROL 5F (Vascular Products) IMPLANT
GUIDEWIRE ANGLED .035X150CM (WIRE) IMPLANT
KIT ANGIASSIST CO2 SYSTEM (KITS) IMPLANT
KIT MICROPUNCTURE NIT STIFF (SHEATH) IMPLANT
KIT PV (KITS) ×1 IMPLANT
SHEATH PINNACLE 5F 10CM (SHEATH) IMPLANT
SHEATH PROBE COVER 6X72 (BAG) IMPLANT
SYR MEDRAD MARK V 150ML (SYRINGE) IMPLANT
TRANSDUCER W/STOPCOCK (MISCELLANEOUS) ×1 IMPLANT
TRAY PV CATH (CUSTOM PROCEDURE TRAY) ×1 IMPLANT
WIRE BENTSON .035X145CM (WIRE) IMPLANT

## 2022-11-29 NOTE — Inpatient Diabetes Management (Signed)
Inpatient Diabetes Program Recommendations  AACE/ADA: New Consensus Statement on Inpatient Glycemic Control (2015)  Target Ranges:  Prepandial:   less than 140 mg/dL      Peak postprandial:   less than 180 mg/dL (1-2 hours)      Critically ill patients:  140 - 180 mg/dL   Lab Results  Component Value Date   GLUCAP 187 (H) 11/29/2022   HGBA1C 7.6 (A) 08/26/2022    Review of Glycemic Control  Diabetes history: DM 2 Outpatient Diabetes medications: Lantus 30 units bid, Humalog SSI tid, Ozempic 1 mg Weekly, farxiga 10 mg Daily, Freestyle Libre for glucose checks Current orders for Inpatient glycemic control:  Lantus 30 units bid Tradjenta 5 mg Daily Novolog 0-15 units tid + hs Novolog 5 units tid meal coverage  Ensure max qhs A1c 7.6% on 9/14 Endocrinologist: Dr. Benjiman Core  Spoke with pt at bedside regarding Glucose control at home. Encouraged pt to message [name] when he sees his glucose trends increase. Pt states his levels have increased since trying to heal his wound 1.5 months ago. Encouraged follow up. Pt states he has had diabetes for 30 years and sometimes tried to take extra boluses of Humalog to control his glucose levels.  Thanks, Tama Headings RN, MSN, BC-ADM Inpatient Diabetes Coordinator Team Pager [phone] (8a-5p)

## 2022-11-29 NOTE — Op Note (Signed)
    Patient name: Joshua [name] MRN: [id number] DOB: 1956/08/21 Sex: male  11/29/2022 Pre-operative Diagnosis: Chronic left lower extremity limb threatening ischemia with large ulceration Post-operative diagnosis:  Same Surgeon:  Eda Paschal. Donzetta Matters, MD Procedure Performed: 1.  Ultrasound-guided cannulation right common femoral artery 2.  CO2 aortogram 3.  Selection of left common femoral artery and selection of left popliteal artery at the left lower extremity with left lower extremity angiogram  4.  Mynx device closure right common femoral artery 5.  Moderate sedation with fentanyl and Versed for 26 minutes  Indications: 66 year old male with diabetes and large ulceration on his foot for the past 6 weeks now with normal ABIs but monophasic tibial vessels bilaterally indicated for angiography with possible intervention.  Findings: The aorta and iliac segments are free of flow-limiting stenosis.  Left common femoral and SFA are patent with brisk flow using CO2.  Contrast below the knee demonstrates an occluded anterior tibial artery just after the takeoff that does not reconstitute with very large posterior tibial artery which is the dominant runoff to the foot and peroneal artery to the level of the ankle.  Patient will need diligent wound care and remains high risk for below-knee amputation.   Procedure:  The patient was identified in the holding area and taken to room 8.  The patient was then placed supine on the table and prepped and draped in the usual sterile fashion.  A time out was called.  Ultrasound was used to evaluate the right common femoral artery which was noted be patent and compressible.  The area was anesthetized 1% lidocaine and cannulated with micropuncture needle followed by wire and sheath.  And images saved department record.  Concomitantly we administered fentanyl and Versed and his vital signs were monitored through by bedside nursing throughout the case.  We placed a  Bentson wire followed by 5 [place] sheath and then placed an Omni catheter to the level of L1 and CO2 aortogram was performed.  We crossed the bifurcation the level of the common femoral artery perform CO2 left lower extremity angiography and then to the level of the knee we placed a straight catheter over the Glidewire and performed contrasted angiography below the knee.  With the above findings we elected no intervention.  Catheter was removed with a wire.  Mynx device was deployed without complication.  Contrast: 20cc   Umaima Scholten C. Donzetta Matters, MD [facility] Office: [phone] Pager: [phone]

## 2022-11-29 NOTE — Progress Notes (Signed)
PODIATRY PROGRESS NOTE  NAME Joshua [name] MRN [id number] DOB 05-14-1956 DOA 11/25/2022   Reason for consult:  Chief Complaint  Patient presents with   Wound Check     History of present illness: 66 y.o. male admitted for left foot lateral foot ulcer with osteomyelitis.  He previously has undergone debridement, graft/VAC application which did not heal.  He underwent angio today.  Originally discussed the below-knee potation but he wants to discuss salvage options.  Vitals:   11/29/22 1730 11/29/22 2036  BP: 111/60 135/79  Pulse: 80 82  Resp: 17 17  Temp:  98.3 F (36.8 C)  SpO2: 99% 97%       Latest Ref Rng & Units 11/29/2022    3:16 AM 11/28/2022    2:01 AM 11/27/2022    9:37 AM  CBC  WBC 4.0 - 10.5 K/uL 6.1  6.6  8.9   Hemoglobin 13.0 - 17.0 g/dL 10.8  10.8  12.0   Hematocrit 39.0 - 52.0 % 34.2  33.6  37.7   Platelets 150 - 400 K/uL 232  228  288        Latest Ref Rng & Units 11/29/2022    3:16 AM 11/28/2022    2:01 AM 11/27/2022    9:37 AM  BMP  Glucose 70 - 99 mg/dL 230  204  219   BUN 8 - 23 mg/dL 48  47  40   Creatinine 0.61 - 1.24 mg/dL 1.80  1.97  2.06   Sodium 135 - 145 mmol/L 135  134  135   Potassium 3.5 - 5.1 mmol/L 4.0  3.8  3.8   Chloride 98 - 111 mmol/L 106  100  105   CO2 22 - 32 mmol/L $RemoveB'22  23  21   'CzJpBqFH$ Calcium 8.9 - 10.3 mg/dL 8.6  8.6  8.8       Physical Exam: General: AAOx3, NAD  Dermatology: Full-thickness ulceration noted to the lateral aspect of the fifth metatarsal base of the left foot.  There is edema to the foot.  There is no fluctuance or crepitation.  There is no drainage noted today.  Vascular: Foot appears to be warm  Neurological: Sensation decreased  Musculoskeletal Exam: No pain on exam    ASSESSMENT/PLAN OF CARE  Chronic ulceration left foot fifth metatarsal base with osteomyelitis  I have again reviewed the MRI with him.  We discussed limb salvage versus amputation.  He does want to try for limb salvage and I  think is reasonable at this time.  I discussed with him wound excision/debridement of the wound, excision fifth metatarsal base, graft, VAC application and possible peroneal tendon transfer.  We discussed risks of surgery including spread of infection, he is still at high risk of proximal amputation, bleeding, scarring as well as general risk of surgery occluding stroke, heart attack, death.  He is aware of the risks and he wants to proceed.  We will plan on surgery tomorrow afternoon.  N.p.o. after 8 AM of surgery would likely not be until 4 PM.  Consent order placed.  Her abdomen will be performing the surgery and the patient is aware to discuss this with him as well.  I did order new x-rays as the last one as 11/26. We may need to consider reordering a MRI.     Please contact me directly with any questions or concerns.     Celesta Gentile, DPM [facility]  Dr. Bonna Gains. Jacqualyn Posey, DPM  [address], [place] [address]                Office [phone]  Fax [phone]

## 2022-11-29 NOTE — Progress Notes (Signed)
Pt received from cath lab AxOx4, VS wnL and as per flow. Pt oriented to 6E processes and re-educated to his bedrest protocol. Pt familiar with the [facility] system. All questions and concerns addressed. Call bell placed within reach, will continue to monitor and maintain safety.

## 2022-11-29 NOTE — Interval H&P Note (Signed)
History and Physical Interval Note:  11/29/2022 9:41 AM  Joshua [name]  has presented today for surgery, with the diagnosis of LE ulcer.  The various methods of treatment have been discussed with the patient and family. After consideration of risks, benefits and other options for treatment, the patient has consented to  Procedure(s): ABDOMINAL AORTOGRAM W/LOWER EXTREMITY (N/A) as a surgical intervention.  The patient's history has been reviewed, patient examined, no change in status, stable for surgery.  I have reviewed the patient's chart and labs.  Questions were answered to the patient's satisfaction.     Servando Snare

## 2022-11-29 NOTE — Consult Note (Signed)
WOC consulted for foot wound, he is being followed by vascular and podiatry.  I have sent a [place] to podiatry to follow up on current wound care orders now that patient is post vascular intervention.   [place], [place], [place]

## 2022-11-29 NOTE — Progress Notes (Signed)
PROGRESS NOTE    Joshua [name]  SEG:[id number] DOB: [birth year] DOA: 11/25/2022 PCP: [name], MD   Brief Narrative: 66 year old with past medical history significant for poorly healing wound of the left lateral foot, diabetes type 2 complicated by diabetic poorly neuropathy, chronic diastolic heart failure, CKD stage IIIb, creatinine baseline 2--2.4, hypertension, hyperlipidemia who presents with nonhealing wound of the left foot after refer for admission from podiatry clinic.  Dr. Crist Fat for podiatry is recommending left BKA.  Vascular consulted, plan for angiogram with CO2 contrast 12/18.   Assessment & Plan:   Principal Problem:   Non-healing wound of left lower extremity Active Problems:   Hyperlipidemia   Chronic diastolic CHF (congestive heart failure) (HCC)   Stage 3b chronic kidney disease (CKD) (HCC)   DM2 (diabetes mellitus, type 2) ([place])   Essential hypertension   Preop cardiovascular exam   Cardiac chest pain   1-Nonhealing wound of the lateral aspect of the left foot:, complicated by osteomyelitis: -Status postdebridement by podiatry on 11/06/2022. -He has been on oral antibiotics and wound VAC as an outpatient but wound continued  not improve. -ABI done in 11/02/2022: -Podiatry is recommending left BKA. -Vascular recommend initiation of antibiotics. Started Linezolid, ceftriaxone, flagyl.  -Underwent arteriogram 12-18: The aorta and iliac segments are free of flow-limiting stenosis. Left common femoral and SFA are patent with brisk flow using CO2. Contrast below the knee demonstrates an occluded anterior tibial artery just after the takeoff that does not reconstitute with very large posterior tibial artery which is the dominant runoff to the foot and peroneal artery to the level of the ankle.  Follow recommendation from vascular and podiatry   2-Diabetes type 2: Hyperglycemia.  -Diabetic neuropathy -Recent A1c 7.6. -Continue sliding scale insulin -On   Semglee 30 units am and 10 units HS> . Increase meals coverage to 5 units.  -Resume gabapentin.   3-Chronic diastolic heart failure: Last echo 08/2021 ejection fraction 55 to 73% grade 2 diastolic dysfunction. Hold lasix.  Monitor volume status.  CKD stage IIIb: Creatinine range 2.0--2.4 Cr down to 1.9, better than baseline   CAD; History stent placement LAD; Develops chest pain  12/16 Resolved with nitroglycerin.  Troponin mildly elevated at 40 Discussed with vascular ok to resume aspirin and brilinta.  Cardiology consulted. Discussed with cardio ok to hold brilinta, in anticipation of BKA>  On metoprolol and statins.  Continue with Imdur.   Hypertension:  PRN hydralazine.  Continue with Metoprolol, Imdur.   Hyperlipidemia: Continue with statins.  Vascular diseases:  ABI : 11/02/2022 ABI/TBIToday's ABIToday's TBIPrevious ABIPrevious TBI  +-------+-----------+-----------+------------+------------+  Right 1.28       amp.       0.90                      +-------+-----------+-----------+------------+------------+  Left  1.15       0.70       1.05     Vascular consulted.  Underwent arteriogram 12/18      Estimated body mass index is 41.18 kg/m as calculated from the following:   Height as of this encounter: $RemoveBeforeD'5\' 9"'WBLWbbwRBPHCmv$  (1.753 m).   Weight as of this encounter: 126.5 kg.   DVT prophylaxis: SCD Code Status: Full code Family Communication: care discussed with patient Disposition Plan:  Status is: Inpatient Remains inpatient appropriate because:     Consultants:  Vascular   Procedures:    Antimicrobials:    Subjective: Saw patient prior to arteriogram. No new complaints.  Objective: Vitals:   11/29/22 1155 11/29/22 1200 11/29/22 1205 11/29/22 1247  BP: (!) 145/74 (!) 159/77 (!) 136/57 131/79  Pulse: 81 81 82 81  Resp: $Remo'13 14 16 16  'eYuZr$ Temp:    98.3 F (36.8 C)  TempSrc:    Oral  SpO2: 98% 98% 97% 98%  Weight:      Height:         Intake/Output Summary (Last 24 hours) at 11/29/2022 1355 Last data filed at 11/28/2022 2312 Gross per 24 hour  Intake 529.67 ml  Output 700 ml  Net -170.33 ml    Filed Weights   11/26/22 1655 11/29/22 0500  Weight: 122.5 kg 126.5 kg    Examination:  General exam: NAD Respiratory system: CTA Cardiovascular system: S 1, S 2 RRR Gastrointestinal system: BS present, soft nt Central nervous system: Alert Extremities: no edema, dressing left foot, open wound.   Data Reviewed: I have personally reviewed following labs and imaging studies  CBC: Recent Labs  Lab 11/25/22 1450 11/26/22 0411 11/27/22 0937 11/28/22 0201 11/29/22 0316  WBC 11.6* 9.9 8.9 6.6 6.1  NEUTROABS 8.5* 7.1  --   --   --   HGB 12.1* 11.7* 12.0* 10.8* 10.8*  HCT 39.1 36.5* 37.7* 33.6* 34.2*  MCV 89.5 86.9 87.7 85.9 88.6  PLT 312 281 288 228 259    Basic Metabolic Panel: Recent Labs  Lab 11/25/22 1450 11/26/22 0411 11/27/22 0937 11/28/22 0201 11/29/22 0316  NA 138 137 135 134* 135  K 4.1 3.7 3.8 3.8 4.0  CL 106 104 105 100 106  CO2 22 21* 21* 23 22  GLUCOSE 154* 166* 219* 204* 230*  BUN 45* 50* 40* 47* 48*  CREATININE 2.30* 2.48* 2.06* 1.97* 1.80*  CALCIUM 9.0 9.0 8.8* 8.6* 8.6*  MG  --  2.4  --   --   --     GFR: Estimated Creatinine Clearance: 54.5 mL/min (A) (by C-G formula based on SCr of 1.8 mg/dL (H)). Liver Function Tests: Recent Labs  Lab 11/26/22 0411  AST 21  ALT 16  ALKPHOS 130*  BILITOT 0.5  PROT 7.6  ALBUMIN 2.4*    No results for input(s): "LIPASE", "AMYLASE" in the last 168 hours. No results for input(s): "AMMONIA" in the last 168 hours. Coagulation Profile: Recent Labs  Lab 11/26/22 0411  INR 1.2    Cardiac Enzymes: No results for input(s): "CKTOTAL", "CKMB", "CKMBINDEX", "TROPONINI" in the last 168 hours. BNP (last 3 results) No results for input(s): "PROBNP" in the last 8760 hours. HbA1C: No results for input(s): "HGBA1C" in the last 72  hours. CBG: Recent Labs  Lab 11/28/22 1138 11/28/22 1558 11/28/22 2209 11/28/22 2229 11/29/22 0733  GLUCAP 327* 239* 186* 221* 187*    Lipid Profile: No results for input(s): "CHOL", "HDL", "LDLCALC", "TRIG", "CHOLHDL", "LDLDIRECT" in the last 72 hours. Thyroid Function Tests: No results for input(s): "TSH", "T4TOTAL", "FREET4", "T3FREE", "THYROIDAB" in the last 72 hours. Anemia Panel: No results for input(s): "VITAMINB12", "FOLATE", "FERRITIN", "TIBC", "IRON", "RETICCTPCT" in the last 72 hours. Sepsis Labs: No results for input(s): "PROCALCITON", "LATICACIDVEN" in the last 168 hours.  No results found for this or any previous visit (from the past 240 hour(s)).       Radiology Studies: PERIPHERAL VASCULAR CATHETERIZATION  Result Date: 11/29/2022 Images from the original result were not included. Patient name: Joshua [name] MRN: [id number] DOB: 11-Feb-1956 Sex: male 11/29/2022 Pre-operative Diagnosis: Chronic left lower extremity limb threatening ischemia with large ulceration Post-operative diagnosis:  Same Surgeon:  Eda Paschal. Donzetta Matters, MD Procedure Performed: 1.  Ultrasound-guided cannulation right common femoral artery 2.  CO2 aortogram 3.  Selection of left common femoral artery and selection of left popliteal artery at the left lower extremity with left lower extremity angiogram 4.  Mynx device closure right common femoral artery 5.  Moderate sedation with fentanyl and Versed for 26 minutes Indications: 66 year old male with diabetes and large ulceration on his foot for the past 6 weeks now with normal ABIs but monophasic tibial vessels bilaterally indicated for angiography with possible intervention. Findings: The aorta and iliac segments are free of flow-limiting stenosis.  Left common femoral and SFA are patent with brisk flow using CO2.  Contrast below the knee demonstrates an occluded anterior tibial artery just after the takeoff that does not reconstitute with very large  posterior tibial artery which is the dominant runoff to the foot and peroneal artery to the level of the ankle. Patient will need diligent wound care and remains high risk for below-knee amputation.  Procedure:  The patient was identified in the holding area and taken to room 8.  The patient was then placed supine on the table and prepped and draped in the usual sterile fashion.  A time out was called.  Ultrasound was used to evaluate the right common femoral artery which was noted be patent and compressible.  The area was anesthetized 1% lidocaine and cannulated with micropuncture needle followed by wire and sheath.  And images saved department record.  Concomitantly we administered fentanyl and Versed and his vital signs were monitored through by bedside nursing throughout the case.  We placed a Bentson wire followed by 5 [place] sheath and then placed an Omni catheter to the level of L1 and CO2 aortogram was performed.  We crossed the bifurcation the level of the common femoral artery perform CO2 left lower extremity angiography and then to the level of the knee we placed a straight catheter over the Glidewire and performed contrasted angiography below the knee.  With the above findings we elected no intervention.  Catheter was removed with a wire.  Mynx device was deployed without complication. Contrast: 20cc Brandon C. Donzetta Matters, MD [facility] Office: [phone] Pager: [phone]       Scheduled Meds:  ascorbic acid  500 mg Oral BID   aspirin EC  81 mg Oral Daily   atorvastatin  80 mg Oral Daily   enoxaparin (LOVENOX) injection  60 mg Subcutaneous Q24H   gabapentin  100 mg Oral QHS   influenza vac split quadrivalent PF  0.5 mL Intramuscular Tomorrow-1000   insulin aspart  0-15 Units Subcutaneous TID WC   insulin aspart  0-5 Units Subcutaneous QHS   insulin aspart  5 Units Subcutaneous TID WC   insulin glargine-yfgn  10 Units Subcutaneous QHS   insulin  glargine-yfgn  30 Units Subcutaneous Daily   isosorbide mononitrate  180 mg Oral Daily   metoprolol succinate  75 mg Oral Daily   multivitamin with minerals  1 tablet Oral Daily   nutrition supplement (JUVEN)  1 packet Oral BID BM   Ensure Max Protein  11 oz Oral QHS   sodium chloride flush  3 mL Intravenous Q12H   zinc sulfate  220 mg Oral Daily   Continuous Infusions:  sodium chloride 75 mL/hr at 11/29/22 0951   sodium chloride     sodium chloride     cefTRIAXone (ROCEPHIN)  IV 2 g (11/28/22 1636)  linezolid (ZYVOX) IV 600 mg (11/29/22 0953)   metronidazole 500 mg (11/29/22 0425)     LOS: 3 days    Time spent: 35 minutes    Rommie Dunn A Jillene Wehrenberg, MD [facility]   If 7PM-7AM, please contact night-coverage [email or web address]  11/29/2022, 1:55 PM

## 2022-11-29 NOTE — Progress Notes (Signed)
B/L foot wounds assessed by surgeon. Plan for (L) foot surgery tomorrow. NPO @ midnight. Pt aware and agreeable. No orders yet.

## 2022-11-29 NOTE — Care Management Important Message (Signed)
Important Message  Patient Details  Name: Joshua [name] MRN: [id number] Date of Birth: 09-05-1956   Medicare Important Message Given:  Yes  Patient left prior to IM delivery  will mail to the home address.       Colbe Viviano 11/29/2022, 3:22 PM

## 2022-11-29 NOTE — Care Management Important Message (Signed)
Important Message  Patient Details  Name: Joshua [name] MRN: [id number] Date of Birth: 09-23-1956   Medicare Important Message Given:  Yes     [name] 11/29/2022, 12:46 PM

## 2022-11-30 ENCOUNTER — Inpatient Hospital Stay (HOSPITAL_COMMUNITY): Payer: Medicare Other | Admitting: Certified Registered"

## 2022-11-30 ENCOUNTER — Encounter (HOSPITAL_COMMUNITY): Payer: Self-pay | Admitting: Internal Medicine

## 2022-11-30 ENCOUNTER — Encounter: Payer: Self-pay | Admitting: *Deleted

## 2022-11-30 ENCOUNTER — Encounter (HOSPITAL_COMMUNITY)
Admission: EM | Disposition: A | Discharge status: Skilled Nursing Facility | Payer: Self-pay | Source: Home / Self Care | Attending: Family Medicine

## 2022-11-30 ENCOUNTER — Inpatient Hospital Stay (HOSPITAL_COMMUNITY): Payer: Medicare Other

## 2022-11-30 DIAGNOSIS — Z7984 Long term (current) use of oral hypoglycemic drugs: Secondary | ICD-10-CM

## 2022-11-30 DIAGNOSIS — M86172 Other acute osteomyelitis, left ankle and foot: Secondary | ICD-10-CM | POA: Diagnosis not present

## 2022-11-30 DIAGNOSIS — Z794 Long term (current) use of insulin: Secondary | ICD-10-CM

## 2022-11-30 DIAGNOSIS — S81802A Unspecified open wound, left lower leg, initial encounter: Secondary | ICD-10-CM | POA: Diagnosis not present

## 2022-11-30 DIAGNOSIS — M869 Osteomyelitis, unspecified: Secondary | ICD-10-CM

## 2022-11-30 DIAGNOSIS — E1169 Type 2 diabetes mellitus with other specified complication: Secondary | ICD-10-CM

## 2022-11-30 DIAGNOSIS — I2510 Atherosclerotic heart disease of native coronary artery without angina pectoris: Secondary | ICD-10-CM

## 2022-11-30 DIAGNOSIS — I10 Essential (primary) hypertension: Secondary | ICD-10-CM

## 2022-11-30 HISTORY — PX: GRAFT APPLICATION: SHX6696

## 2022-11-30 HISTORY — PX: APPLICATION OF WOUND VAC: SHX5189

## 2022-11-30 HISTORY — PX: WOUND DEBRIDEMENT: SHX247

## 2022-11-30 LAB — BASIC METABOLIC PANEL
Anion gap: 6 (ref 5–15)
BUN: 45 mg/dL — ABNORMAL HIGH (ref 8–23)
CO2: 21 mmol/L — ABNORMAL LOW (ref 22–32)
Calcium: 8.6 mg/dL — ABNORMAL LOW (ref 8.9–10.3)
Chloride: 108 mmol/L (ref 98–111)
Creatinine, Ser: 1.66 mg/dL — ABNORMAL HIGH (ref 0.61–1.24)
GFR, Estimated: 46 mL/min — ABNORMAL LOW (ref >60)
Glucose, Bld: 160 mg/dL — ABNORMAL HIGH (ref 70–99)
Potassium: 4.1 mmol/L (ref 3.5–5.1)
Sodium: 135 mmol/L (ref 135–145)

## 2022-11-30 LAB — CBC
HCT: 33 % — ABNORMAL LOW (ref 39.0–52.0)
Hemoglobin: 10.3 g/dL — ABNORMAL LOW (ref 13.0–17.0)
MCH: 27.8 pg (ref 26.0–34.0)
MCHC: 31.2 g/dL (ref 30.0–36.0)
MCV: 88.9 fL (ref 80.0–100.0)
Platelets: 245 10*3/uL (ref 150–400)
RBC: 3.71 MIL/uL — ABNORMAL LOW (ref 4.22–5.81)
RDW: 18.1 % — ABNORMAL HIGH (ref 11.5–15.5)
WBC: 7.2 10*3/uL (ref 4.0–10.5)
nRBC: 0 % (ref 0.0–0.2)

## 2022-11-30 LAB — GLUCOSE, CAPILLARY
Glucose-Capillary: 216 mg/dL — ABNORMAL HIGH (ref 70–99)
Glucose-Capillary: 136 mg/dL — ABNORMAL HIGH (ref 70–99)
Glucose-Capillary: 141 mg/dL — ABNORMAL HIGH (ref 70–99)
Glucose-Capillary: 115 mg/dL — ABNORMAL HIGH (ref 70–99)
Glucose-Capillary: 91 mg/dL (ref 70–99)
Glucose-Capillary: 113 mg/dL — ABNORMAL HIGH (ref 70–99)
Glucose-Capillary: 169 mg/dL — ABNORMAL HIGH (ref 70–99)

## 2022-11-30 LAB — SURGICAL PCR SCREEN
MRSA, PCR: NEGATIVE
Staphylococcus aureus: NEGATIVE

## 2022-11-30 LAB — LIPID PANEL
Cholesterol: 106 mg/dL (ref 0–200)
HDL: 43 mg/dL (ref >40)
LDL Cholesterol: 33 mg/dL (ref 0–99)
Total CHOL/HDL Ratio: 2.5 RATIO
Triglycerides: 151 mg/dL — ABNORMAL HIGH (ref <150)
VLDL: 30 mg/dL (ref 0–40)

## 2022-11-30 SURGERY — DEBRIDEMENT, WOUND
Anesthesia: Monitor Anesthesia Care | Laterality: Left

## 2022-11-30 MED ORDER — MIDAZOLAM HCL 2 MG/2ML IJ SOLN
INTRAMUSCULAR | Status: AC
  Filled 2022-11-30: qty 2

## 2022-11-30 MED ORDER — SODIUM CHLORIDE 0.9 % IR SOLN
Status: DC | PRN
  Administered 2022-11-30: 3000 mL

## 2022-11-30 MED ORDER — ONDANSETRON HCL 4 MG/2ML IJ SOLN: 4.0000 mg | Freq: Once | INTRAMUSCULAR | Status: DC | PRN

## 2022-11-30 MED ORDER — PROPOFOL 10 MG/ML IV BOLUS
INTRAVENOUS | Status: DC | PRN
  Administered 2022-11-30: 40 mg via INTRAVENOUS

## 2022-11-30 MED ORDER — OXYCODONE HCL 5 MG PO TABS: 5.0000 mg | ORAL_TABLET | Freq: Once | ORAL | Status: DC | PRN

## 2022-11-30 MED ORDER — ONDANSETRON HCL 4 MG/2ML IJ SOLN
INTRAMUSCULAR | Status: DC | PRN
  Administered 2022-11-30: 4 mg via INTRAVENOUS

## 2022-11-30 MED ORDER — 0.9 % SODIUM CHLORIDE (POUR BTL) OPTIME
TOPICAL | Status: DC | PRN
  Administered 2022-11-30: 1000 mL

## 2022-11-30 MED ORDER — PROPOFOL 500 MG/50ML IV EMUL
INTRAVENOUS | Status: DC | PRN
  Administered 2022-11-30: 100 ug/kg/min via INTRAVENOUS

## 2022-11-30 MED ORDER — FENTANYL CITRATE (PF) 100 MCG/2ML IJ SOLN: 25.0000 ug | INTRAMUSCULAR | Status: DC | PRN

## 2022-11-30 MED ORDER — BUPIVACAINE HCL (PF) 0.5 % IJ SOLN
INTRAMUSCULAR | Status: DC | PRN
  Administered 2022-11-30: 20 mL

## 2022-11-30 MED ORDER — OXYCODONE HCL 5 MG/5ML PO SOLN: 5.0000 mg | Freq: Once | ORAL | Status: DC | PRN

## 2022-11-30 MED ORDER — PHENYLEPHRINE 80 MCG/ML (10ML) SYRINGE FOR IV PUSH (FOR BLOOD PRESSURE SUPPORT)
PREFILLED_SYRINGE | INTRAVENOUS | Status: DC | PRN
  Administered 2022-11-30 (×2): 160 ug via INTRAVENOUS

## 2022-11-30 MED ORDER — MIDAZOLAM HCL 2 MG/2ML IJ SOLN
INTRAMUSCULAR | Status: DC | PRN
  Administered 2022-11-30: 2 mg via INTRAVENOUS

## 2022-11-30 MED ORDER — CHLORHEXIDINE GLUCONATE 0.12 % MT SOLN
OROMUCOSAL | Status: AC
  Filled 2022-11-30: qty 15

## 2022-11-30 MED ORDER — VANCOMYCIN HCL 1000 MG IV SOLR
INTRAVENOUS | Status: AC
  Filled 2022-11-30: qty 20

## 2022-11-30 MED ORDER — BUPIVACAINE HCL (PF) 0.5 % IJ SOLN
INTRAMUSCULAR | Status: AC
  Filled 2022-11-30: qty 30

## 2022-11-30 MED FILL — Heparin Sod (Porcine)-NaCl IV Soln 1000 Unit/500ML-0.9%: INTRAVENOUS | Qty: 1000 | Status: AC

## 2022-11-30 SURGICAL SUPPLY — 50 items
BAG COUNTER SPONGE SURGICOUNT (BAG) ×1 IMPLANT
BAG SPNG CNTER NS LX DISP (BAG) ×1
BNDG ELASTIC 4X5.8 VLCR STR LF (GAUZE/BANDAGES/DRESSINGS) IMPLANT
BNDG ELASTIC 6X5.8 VLCR STR LF (GAUZE/BANDAGES/DRESSINGS) IMPLANT
CANISTER SUCT 3000ML PPV (MISCELLANEOUS) ×1 IMPLANT
CANISTER WOUND CARE 500ML ATS (WOUND CARE) ×1 IMPLANT
CANISTER WOUNDNEG PRESSURE 500 (CANNISTER) IMPLANT
COVER SURGICAL LIGHT HANDLE (MISCELLANEOUS) ×1 IMPLANT
DRAPE DERMATAC (DRAPES) IMPLANT
DRAPE HALF SHEET 40X57 (DRAPES) IMPLANT
DRAPE INCISE IOBAN 66X45 STRL (DRAPES) IMPLANT
DRAPE ORTHO SPLIT 77X108 STRL (DRAPES)
DRAPE SURG ORHT 6 SPLT 77X108 (DRAPES) IMPLANT
DRAPE U-SHAPE 47X51 STRL (DRAPES) ×1 IMPLANT
DRSG EMULSION OIL 3X3 NADH (GAUZE/BANDAGES/DRESSINGS) IMPLANT
DRSG VAC GRANUFOAM SM (GAUZE/BANDAGES/DRESSINGS) IMPLANT
ELECT CAUTERY BLADE 6.4 (BLADE) ×1 IMPLANT
ELECT REM PT RETURN 9FT ADLT (ELECTROSURGICAL) ×1
ELECTRODE REM PT RTRN 9FT ADLT (ELECTROSURGICAL) ×1 IMPLANT
GLOVE BIO SURGEON STRL SZ7 (GLOVE) ×1 IMPLANT
GLOVE BIO SURGEON STRL SZ7.5 (GLOVE) ×1 IMPLANT
GLOVE BIO SURGEON STRL SZ8 (GLOVE) ×1 IMPLANT
GLOVE BIOGEL PI IND STRL 7.5 (GLOVE) ×1 IMPLANT
GLOVE BIOGEL PI IND STRL 8 (GLOVE) ×2 IMPLANT
GLOVE SURG ORTHO LTX SZ7.5 (GLOVE) ×2 IMPLANT
GOWN STRL REUS W/ TWL LRG LVL3 (GOWN DISPOSABLE) ×2 IMPLANT
GOWN STRL REUS W/ TWL XL LVL3 (GOWN DISPOSABLE) ×1 IMPLANT
GOWN STRL REUS W/TWL LRG LVL3 (GOWN DISPOSABLE) ×2
GOWN STRL REUS W/TWL XL LVL3 (GOWN DISPOSABLE) ×1
GRAFT MYRIAD 3 LAYER 5X5 (Graft) IMPLANT
KIT BASIN OR (CUSTOM PROCEDURE TRAY) ×1 IMPLANT
KIT TURNOVER KIT B (KITS) ×1 IMPLANT
MANIFOLD NEPTUNE II (INSTRUMENTS) ×1 IMPLANT
NDL HYPO 25GX1X1/2 BEV (NEEDLE) IMPLANT
NEEDLE HYPO 25GX1X1/2 BEV (NEEDLE) IMPLANT
NS IRRIG 1000ML POUR BTL (IV SOLUTION) ×1 IMPLANT
PACK ORTHO EXTREMITY (CUSTOM PROCEDURE TRAY) ×1 IMPLANT
PAD ARMBOARD 7.5X6 YLW CONV (MISCELLANEOUS) ×2 IMPLANT
PADDING CAST SYNTHETIC 4X4 STR (CAST SUPPLIES) IMPLANT
POWDER MYRIAD MORCELLS 500MG (Miscellaneous) IMPLANT
SET CYSTO W/LG BORE CLAMP LF (SET/KITS/TRAYS/PACK) ×1 IMPLANT
SOL PREP POV-IOD 4OZ 10% (MISCELLANEOUS) ×2 IMPLANT
SPLINT FIBERGLASS 4X30 (CAST SUPPLIES) IMPLANT
SUT PROLENE 1 CTX 30  8455H (SUTURE) ×1
SUT PROLENE 1 CTX 30 8455H (SUTURE) IMPLANT
SYR CONTROL 10ML LL (SYRINGE) IMPLANT
TOWEL GREEN STERILE (TOWEL DISPOSABLE) ×1 IMPLANT
TOWEL GREEN STERILE FF (TOWEL DISPOSABLE) ×1 IMPLANT
TUBE CONNECTING 12X1/4 (SUCTIONS) ×1 IMPLANT
YANKAUER SUCT BULB TIP NO VENT (SUCTIONS) ×1 IMPLANT

## 2022-11-30 NOTE — Brief Op Note (Signed)
11/30/2022  5:09 PM  PATIENT:  Joshua [name]  66 y.o. male  PRE-OPERATIVE DIAGNOSIS:  Left foot osteomyelitis, infection  POST-OPERATIVE DIAGNOSIS:  Left foot osteomyelitis, infection  PROCEDURE:  Procedure(s): DEBRIDEMENT WOUND (Left) GRAFT APPLICATION (Left) APPLICATION OF WOUND VAC (Left)  SURGEON:  Surgeon(s) and Role:    * Doren Kaspar, Stephan Minister, DPM - Primary    ASSISTANTS: none   ANESTHESIA:   local and MAC  EBL:  100cc   BLOOD ADMINISTERED:none  DRAINS: none   LOCAL MEDICATIONS USED:  MARCAINE    and Amount: 20 ml  SPECIMEN:  5th metatarsal, cuboid  DISPOSITION OF SPECIMEN:  path and micro for each specimen  COUNTS:  YES  TOURNIQUET:  * No tourniquets in log *  DICTATION: .Note written in EPIC  PLAN OF CARE: Admit to inpatient   PATIENT DISPOSITION:  PACU - hemodynamically stable.   Delay start of Pharmacological VTE agent (>24hrs) due to surgical blood loss or risk of bleeding: yes  - Gross necrosis of soft tissue. Peroneus brevis insertion non viable for transfer - 5th metatarsal base and metaphysis excised. Bone soft and necrotic. Biopsy and culture taken here as well as cuboid which was similar in appearance - xray ordered - NWB to LLE in splint - first VAC change 12/04/22 - Likely will need PICC and 6 weeks IV abx if limb salvage to be successful. Recommend ID consult - Will need staged tendon transfer/tenodesis at a later date

## 2022-11-30 NOTE — Plan of Care (Signed)
  Problem: Education: Goal: Ability to describe self-care measures that may prevent or decrease complications (Diabetes Survival Skills Education) will improve Outcome: Progressing   Problem: Coping: Goal: Ability to adjust to condition or change in health will improve Outcome: Progressing   Problem: Education: Goal: Knowledge of General Education information will improve Description: Including pain rating scale, medication(s)/side effects and non-pharmacologic comfort measures Outcome: Progressing   Problem: Coping: Goal: Level of anxiety will decrease Outcome: Progressing

## 2022-11-30 NOTE — Patient Instructions (Signed)
  [name] is currently hospitalized due to having complications with his left foot. A procedure will be done today in efforts to salvage his foot. He is very hopeful that he'll receive good news following the procedure. I reached out to the health care team as he had questions regarding not receiving dressing changes to his foot and the time of the procedure. I updated him with the information I received. I will follow up after his procedure. Catalina Pizza

## 2022-11-30 NOTE — Plan of Care (Signed)

## 2022-11-30 NOTE — H&P (Signed)
History and Physical Interval Note:  11/30/2022 3:39 PM  Joshua [name]  has presented today for surgery, with the diagnosis of osteomyelitis, ulcer left foot.  The various methods of treatment have been discussed with the patient and family. After consideration of risks, benefits and other options for treatment, the patient has consented to   Procedure(s): DEBRIDEMENT WOUND (Left) GRAFT APPLICATION (Left) APPLICATION OF WOUND VAC (Left) as a surgical intervention.  The patient's history has been reviewed, patient examined, no change in status, stable for surgery.  I have reviewed the patient's chart and labs.  Questions were answered to the patient's satisfaction.     Criselda Peaches

## 2022-11-30 NOTE — Progress Notes (Signed)
PROGRESS NOTE    Joshua [name]  PQD:[id number] DOB: 08/22/1956 DOA: 11/25/2022 PCP: [name], MD   Brief Narrative: 66 year old with past medical history significant for poorly healing wound of the left lateral foot, diabetes type 2 complicated by diabetic poorly neuropathy, chronic diastolic heart failure, CKD stage IIIb, creatinine baseline 2--2.4, hypertension, hyperlipidemia who presents with nonhealing wound of the left foot after refer for admission from podiatry clinic.  Dr. Crist Fat for podiatry is recommending left BKA.  Vascular consulted, plan for angiogram with CO2 contrast 12/18.   Assessment & Plan:   Principal Problem:   Non-healing wound of left lower extremity Active Problems:   Hyperlipidemia   Chronic diastolic CHF (congestive heart failure) (HCC)   Stage 3b chronic kidney disease (CKD) (HCC)   DM2 (diabetes mellitus, type 2) ([place])   Essential hypertension   Preop cardiovascular exam   Cardiac chest pain   1-Nonhealing wound of the lateral aspect of the left foot:, complicated by osteomyelitis: -Status postdebridement by podiatry on 11/06/2022. -He has been on oral antibiotics and wound VAC as an outpatient but wound continued  not improve. -ABI done in 11/02/2022 -Podiatry is recommending left BKA. -Vascular recommend initiation of antibiotics. Started Linezolid, Ceftriaxone, Flagyl.  -Underwent arteriogram 12-18: The aorta and iliac segments are free of flow-limiting stenosis. Left common femoral and SFA are patent with brisk flow using CO2. Contrast below the knee demonstrates an occluded anterior tibial artery just after the takeoff that does not reconstitute with very large posterior tibial artery which is the dominant runoff to the foot and peroneal artery to the level of the ankle.  -Plan for surgery for limb salvage.   2-Diabetes type 2: Hyperglycemia.  -Diabetic neuropathy -Recent A1c 7.6. -Continue sliding scale insulin -On  Semglee 30 units am  and 10 units HS> . Increase meals coverage to 5 units.  -Resume gabapentin.   3-Chronic diastolic heart failure: Last echo 08/2021 ejection fraction 55 to 94% grade 2 diastolic dysfunction. Hold lasix.  Monitor volume status.  CKD stage IIIb: Creatinine range 2.0--2.4 Cr down to 1.9, better than baseline   CAD; History stent placement LAD; Develops chest pain  12/16 Resolved with nitroglycerin.  Troponin mildly elevated at 40 Discussed with vascular ok to resume aspirin and brilinta.  Cardiology consulted. Discussed with cardio ok to hold brilinta, in anticipation of BKA>  On metoprolol and statins.  Continue with Imdur.  Resume Brilinta, when ok by Podiatry.   Hypertension:  PRN hydralazine.  Continue with Metoprolol, Imdur.   Hyperlipidemia: Continue with statins.  Vascular diseases:  ABI : 11/02/2022 ABI/TBIToday's ABIToday's TBIPrevious ABIPrevious TBI  +-------+-----------+-----------+------------+------------+  Right 1.28       amp.       0.90                      +-------+-----------+-----------+------------+------------+  Left  1.15       0.70       1.05     Vascular consulted.  Underwent arteriogram 12/18      Estimated body mass index is 41.61 kg/m as calculated from the following:   Height as of this encounter: $RemoveBeforeD'5\' 9"'NpkSWhFBVGdHVG$  (1.753 m).   Weight as of this encounter: 127.8 kg.   DVT prophylaxis: SCD Code Status: Full code Family Communication: care discussed with patient Disposition Plan:  Status is: Inpatient Remains inpatient appropriate because:     Consultants:  Vascular   Procedures:    Antimicrobials:    Subjective: No new  complaints. Denies chest pain.   Objective: Vitals:   11/30/22 0446 11/30/22 0841 11/30/22 1210 11/30/22 1530  BP: 138/79 (!) 153/81 124/77 (!) 156/94  Pulse: 77 79 73 75  Resp: $Remo'16 15 15 20  'rGnMO$ Temp: 98.4 F (36.9 C) 98.8 F (37.1 C) 98.5 F (36.9 C) 97.9 F (36.6 C)  TempSrc: Oral Oral Oral Oral   SpO2: 99% 96% 99% 98%  Weight: 127.8 kg     Height: $Remove'5\' 9"'yiprYfL$  (1.753 m)       Intake/Output Summary (Last 24 hours) at 11/30/2022 1635 Last data filed at 11/30/2022 0736 Gross per 24 hour  Intake 250.94 ml  Output 1725 ml  Net -1474.06 ml    Filed Weights   11/26/22 1655 11/29/22 0500 11/30/22 0446  Weight: 122.5 kg 126.5 kg 127.8 kg    Examination:  General exam: NAD Respiratory system: CTA Cardiovascular system: S 1,. S 2 RRR Gastrointestinal system: BS present, soft,nt Central nervous system: Alert, follows command Extremities: no edema, dressing left foot, open wound.   Data Reviewed: I have personally reviewed following labs and imaging studies  CBC: Recent Labs  Lab 11/25/22 1450 11/26/22 0411 11/27/22 0937 11/28/22 0201 11/29/22 0316 11/30/22 0209  WBC 11.6* 9.9 8.9 6.6 6.1 7.2  NEUTROABS 8.5* 7.1  --   --   --   --   HGB 12.1* 11.7* 12.0* 10.8* 10.8* 10.3*  HCT 39.1 36.5* 37.7* 33.6* 34.2* 33.0*  MCV 89.5 86.9 87.7 85.9 88.6 88.9  PLT 312 281 288 228 232 425    Basic Metabolic Panel: Recent Labs  Lab 11/26/22 0411 11/27/22 0937 11/28/22 0201 11/29/22 0316 11/30/22 0209  NA 137 135 134* 135 135  K 3.7 3.8 3.8 4.0 4.1  CL 104 105 100 106 108  CO2 21* 21* 23 22 21*  GLUCOSE 166* 219* 204* 230* 160*  BUN 50* 40* 47* 48* 45*  CREATININE 2.48* 2.06* 1.97* 1.80* 1.66*  CALCIUM 9.0 8.8* 8.6* 8.6* 8.6*  MG 2.4  --   --   --   --     GFR: Estimated Creatinine Clearance: 59.5 mL/min (A) (by C-G formula based on SCr of 1.66 mg/dL (H)). Liver Function Tests: Recent Labs  Lab 11/26/22 0411  AST 21  ALT 16  ALKPHOS 130*  BILITOT 0.5  PROT 7.6  ALBUMIN 2.4*    No results for input(s): "LIPASE", "AMYLASE" in the last 168 hours. No results for input(s): "AMMONIA" in the last 168 hours. Coagulation Profile: Recent Labs  Lab 11/26/22 0411  INR 1.2    Cardiac Enzymes: No results for input(s): "CKTOTAL", "CKMB", "CKMBINDEX", "TROPONINI" in the  last 168 hours. BNP (last 3 results) No results for input(s): "PROBNP" in the last 8760 hours. HbA1C: No results for input(s): "HGBA1C" in the last 72 hours. CBG: Recent Labs  Lab 11/29/22 1726 11/29/22 2346 11/30/22 0806 11/30/22 1207 11/30/22 1522  GLUCAP 205* 216* 136* 141* 115*    Lipid Profile: Recent Labs    11/30/22 0209  CHOL 106  HDL 43  LDLCALC 33  TRIG 151*  CHOLHDL 2.5   Thyroid Function Tests: No results for input(s): "TSH", "T4TOTAL", "FREET4", "T3FREE", "THYROIDAB" in the last 72 hours. Anemia Panel: No results for input(s): "VITAMINB12", "FOLATE", "FERRITIN", "TIBC", "IRON", "RETICCTPCT" in the last 72 hours. Sepsis Labs: No results for input(s): "PROCALCITON", "LATICACIDVEN" in the last 168 hours.  Recent Results (from the past 240 hour(s))  Surgical pcr screen     Status: None   Collection Time:  11/30/22  7:40 AM   Specimen: Nasal Mucosa; Nasal Swab  Result Value Ref Range Status   MRSA, PCR NEGATIVE NEGATIVE Final   Staphylococcus aureus NEGATIVE NEGATIVE Final    Comment: (NOTE) The Xpert SA Assay (FDA approved for NASAL specimens in patients 30 years of age and older), is one component of a comprehensive surveillance program. It is not intended to diagnose infection nor to guide or monitor treatment. Performed at [facility], [place] [address], [place], [place] [address]          Radiology Studies: DG Foot Complete Left  Result Date: 11/29/2022 CLINICAL DATA:  Osteomyelitis EXAM: LEFT FOOT - COMPLETE 3+ VIEW COMPARISON:  Left foot x-ray 11/07/2022. FINDINGS: Skin staples and overlying drain have been removed in the interval. Again seen is lucency and fragmentation of the base of the fifth metatarsal compatible with patient's known osteomyelitis. Alignment appears stable in near anatomic. There is no evidence for dislocation. There is soft tissue swelling of the mid and anterior foot diffusely. IMPRESSION: Lucency and fragmentation of  the base of the fifth metatarsal compatible with patient's known osteomyelitis. Alignment appears stable. Electronically Signed   By: Ronney Asters M.D.   On: 11/29/2022 21:42   PERIPHERAL VASCULAR CATHETERIZATION  Result Date: 11/29/2022 Images from the original result were not included. Patient name: Joshua [name] MRN: [id number] DOB: February 01, 1956 Sex: male 11/29/2022 Pre-operative Diagnosis: Chronic left lower extremity limb threatening ischemia with large ulceration Post-operative diagnosis:  Same Surgeon:  Eda Paschal. Donzetta Matters, MD Procedure Performed: 1.  Ultrasound-guided cannulation right common femoral artery 2.  CO2 aortogram 3.  Selection of left common femoral artery and selection of left popliteal artery at the left lower extremity with left lower extremity angiogram 4.  Mynx device closure right common femoral artery 5.  Moderate sedation with fentanyl and Versed for 26 minutes Indications: 66 year old male with diabetes and large ulceration on his foot for the past 6 weeks now with normal ABIs but monophasic tibial vessels bilaterally indicated for angiography with possible intervention. Findings: The aorta and iliac segments are free of flow-limiting stenosis.  Left common femoral and SFA are patent with brisk flow using CO2.  Contrast below the knee demonstrates an occluded anterior tibial artery just after the takeoff that does not reconstitute with very large posterior tibial artery which is the dominant runoff to the foot and peroneal artery to the level of the ankle. Patient will need diligent wound care and remains high risk for below-knee amputation.  Procedure:  The patient was identified in the holding area and taken to room 8.  The patient was then placed supine on the table and prepped and draped in the usual sterile fashion.  A time out was called.  Ultrasound was used to evaluate the right common femoral artery which was noted be patent and compressible.  The area was anesthetized 1%  lidocaine and cannulated with micropuncture needle followed by wire and sheath.  And images saved department record.  Concomitantly we administered fentanyl and Versed and his vital signs were monitored through by bedside nursing throughout the case.  We placed a Bentson wire followed by 5 [place] sheath and then placed an Omni catheter to the level of L1 and CO2 aortogram was performed.  We crossed the bifurcation the level of the common femoral artery perform CO2 left lower extremity angiography and then to the level of the knee we placed a straight catheter over the Glidewire and performed contrasted angiography below the  knee.  With the above findings we elected no intervention.  Catheter was removed with a wire.  Mynx device was deployed without complication. Contrast: 20cc Brandon C. Donzetta Matters, MD [facility] Office: [phone] Pager: [phone]       Scheduled Meds:  [MAR Hold] ascorbic acid  500 mg Oral BID   [MAR Hold] aspirin EC  81 mg Oral Daily   [MAR Hold] atorvastatin  80 mg Oral Daily   chlorhexidine       [MAR Hold] enoxaparin (LOVENOX) injection  60 mg Subcutaneous Q24H   [MAR Hold] gabapentin  100 mg Oral QHS   influenza vac split quadrivalent PF  0.5 mL Intramuscular Tomorrow-1000   [MAR Hold] insulin aspart  0-15 Units Subcutaneous TID WC   [MAR Hold] insulin aspart  0-5 Units Subcutaneous QHS   [MAR Hold] insulin aspart  5 Units Subcutaneous TID WC   [MAR Hold] insulin glargine-yfgn  10 Units Subcutaneous QHS   [MAR Hold] insulin glargine-yfgn  30 Units Subcutaneous Daily   [MAR Hold] isosorbide mononitrate  180 mg Oral Daily   [MAR Hold] metoprolol succinate  75 mg Oral Daily   [MAR Hold] multivitamin with minerals  1 tablet Oral Daily   [MAR Hold] nutrition supplement (JUVEN)  1 packet Oral BID BM   [MAR Hold] Ensure Max Protein  11 oz Oral QHS   [MAR Hold] sodium chloride flush  3 mL Intravenous Q12H   [MAR Hold] zinc sulfate  220 mg Oral  Daily   Continuous Infusions:  [MAR Hold] sodium chloride 10 mL/hr at 11/30/22 1601   [MAR Hold] cefTRIAXone (ROCEPHIN)  IV 2 g (11/29/22 1714)   [MAR Hold] linezolid (ZYVOX) IV 600 mg (11/30/22 0857)   [MAR Hold] metronidazole 500 mg (11/30/22 1538)     LOS: 4 days    Time spent: 35 minutes    Donaldson Richter A Electa Sterry, MD [facility]   If 7PM-7AM, please contact night-coverage [email or web address]  11/30/2022, 4:35 PM

## 2022-11-30 NOTE — Anesthesia Preprocedure Evaluation (Addendum)
Anesthesia Evaluation  Patient identified by MRN, date of birth, ID band Patient awake    Reviewed: Allergy & Precautions, NPO status , Patient's Chart, lab work & pertinent test results, reviewed documented beta blocker date and time   Airway Mallampati: I  TM Distance: >3 FB Neck ROM: Full    Dental  (+) Chipped,    Pulmonary sleep apnea , COPD  Sarcoidosis    Pulmonary exam normal        Cardiovascular hypertension, Pt. on home beta blockers and Pt. on medications + CAD, + Past MI and + Cardiac Stents  Normal cardiovascular exam   '22 TTE - EF 55 to 60%. There is mild concentric left ventricular hypertrophy. Grade II diastolic dysfunction (pseudonormalization). Left atrial size was mild to moderately dilated. No significant valvulopathy.     Neuro/Psych  PSYCHIATRIC DISORDERS  Depression     Neuromuscular disease    GI/Hepatic PUD,GERD  Medicated and Controlled,,(+)     substance abuse    Endo/Other  diabetes, Type 2, Insulin Dependent, Oral Hypoglycemic Agents  Morbid obesity  Renal/GU Renal InsufficiencyRenal disease     Musculoskeletal  (+) Arthritis ,   Gout   Abdominal   Peds  Hematology  (+) Blood dyscrasia, anemia   Anesthesia Other Findings Osteomyelitis left foot  Reproductive/Obstetrics                             Anesthesia Physical Anesthesia Plan  ASA: 3  Anesthesia Plan: MAC   Post-op Pain Management:    Induction: Intravenous  PONV Risk Score and Plan: 1 and Ondansetron, Propofol infusion, Midazolam and Treatment may vary due to age or medical condition  Airway Management Planned: Simple Face Mask and Natural Airway  Additional Equipment: None  Intra-op Plan:   Post-operative Plan:   Informed Consent: I have reviewed the patients History and Physical, chart, labs and discussed the procedure including the risks, benefits and alternatives for the  proposed anesthesia with the patient or authorized representative who has indicated his/her understanding and acceptance.       Plan Discussed with: CRNA and Anesthesiologist  Anesthesia Plan Comments: ( )       Anesthesia Quick Evaluation

## 2022-11-30 NOTE — TOC Initial Note (Signed)
Transition of Care [facility]) - Initial/Assessment Note    Patient Details  Name: Joshua [name] MRN: [id number] Date of Birth: [birth year]  Transition of Care [facility]) CM/SW Contact:    Bethena Roys, RN Phone Number: 11/30/2022, 3:29 PM  Clinical Narrative: Patient is a transfer from 2W. Patient presented for non-healing wound of the left foot-plan for debridement today with application of wound vac. PTA patient was at an Extended Stay [place] [address]; the patient is active with [facility] for RN, PT,OT. RN was able to visit the patient prior to admission. Case Manager will continue to follow for transition of care needs as the patient progresses.                  Expected Discharge Plan: [place] Barriers to Discharge: Continued Medical Work up  Expected Discharge Plan and Services Expected Discharge Plan: [place] In-house Referral: Clinical Social Work Discharge Planning Services: CM Consult   Living arrangements for the past 2 months: Hotel/Motel (extended stay-)   HH Arranged: RN, Disease Management, PT, OT HH Agency: [place] Date [facility] Agency Contacted: 11/30/22 Time HH Agency Contacted: 1528 Representative spoke with at [place]  Prior Living Arrangements/Services Living arrangements for the past 2 months: Hotel/Motel (extended stay-) Lives with:: Self Patient language and need for interpreter reviewed:: Yes        Need for Family Participation in Patient Care: Yes (Comment) Care giver support system in place?: Yes (comment)   Criminal Activity/Legal Involvement Pertinent to Current Situation/Hospitalization: No - Comment as needed  Activities of Daily Living Home Assistive Devices/Equipment: Cane (specify quad or straight), CPAP ADL Screening (condition at time of admission) Patient's cognitive ability adequate to safely complete daily activities?: Yes Is the patient deaf or have  difficulty hearing?: No Does the patient have difficulty seeing, even when wearing glasses/contacts?: No Does the patient have difficulty concentrating, remembering, or making decisions?: No Patient able to express need for assistance with ADLs?: Yes Does the patient have difficulty dressing or bathing?: No Independently performs ADLs?: Yes (appropriate for developmental age) Does the patient have difficulty walking or climbing stairs?: Yes Weakness of Legs: Both Weakness of Arms/Hands: None  Permission Sought/Granted Permission sought to share information with : Case Manager, Customer service manager, Family Supports   Emotional Assessment Appearance:: Appears stated age     Orientation: : Oriented to Self, Oriented to Place, Oriented to  Time, Oriented to Situation Alcohol / Substance Use: Not Applicable Psych Involvement: No (comment)  Admission diagnosis:  Left foot pain [M79.672] Non-healing wound of left lower extremity [S81.802A] Patient Active Problem List   Diagnosis Date Noted   Left foot pain 11/26/2022   Protein-calorie malnutrition, severe 11/08/2022   Osteomyelitis ([place]) 11/06/2022   Diabetic foot infection ([place]) 11/06/2022   Osteomyelitis of fifth toe of left foot ([place]) 11/05/2022   (HFpEF) heart failure with preserved ejection fraction ([place]) 11/05/2022   CKD (chronic kidney disease), stage IV ([place]) 11/05/2022   Dysphagia 03/16/2022   History of colonic polyps 03/16/2022   Spinal stenosis of lumbar region 11/03/2021   Mixed diabetic hyperlipidemia associated with type 2 diabetes mellitus ([place]) 08/11/2021   Chronic low back pain    Hypokalemia 09/25/2020   Insulin dependent type 2 diabetes mellitus ([place]) 09/24/2020   Unstable angina ([place]) 08/17/2019   Cardiac chest pain 08/16/2019   NSTEMI (non-ST elevated myocardial infarction) ([place]) 04/06/2019   ACS (acute coronary  syndrome) ([place]) 04/06/2019   Sepsis ([place]) 03/15/2018   DKA (diabetic ketoacidoses)  03/15/2018   Preop cardiovascular exam 08/22/2017   Acute renal failure superimposed on stage 3 chronic kidney disease ([place]) 05/19/2017   Essential hypertension    Status post transmetatarsal amputation of foot, right ([place]) 11/08/2016   Diabetic polyneuropathy associated with type 2 diabetes mellitus ([place]) 11/08/2016   Pure hypercholesterolemia    AKI (acute kidney injury) ([place])    Acute blood loss anemia    DM2 (diabetes mellitus, type 2) ([place])    Physical debility 08/03/2016   Chronic osteomyelitis of right foot ([place])    Cellulitis and abscess of leg, except foot    Non-healing wound of left lower extremity    Stage 3b chronic kidney disease (CKD) ([place])    Unstable angina pectoris ([place]) 07/18/2016   Chronic diastolic CHF (congestive heart failure) (HCC)    Chest pain with moderate risk for cardiac etiology 06/24/2014   CAD S/P multiple PCIs 06/24/2014   Sleep apnea- on C-pap 06/24/2014   Class 3 severe obesity with serious comorbidity and body mass index (BMI) of 45.0 to 49.9 in adult [facility]) 11/10/2013   Hyperglycemia 10/29/2013   Hyperlipidemia 01/12/2011   Benign essential HTN 01/12/2011   GERD 01/12/2011   PCP:  [name], MD Pharmacy:   [facility], [place] [address] Phone: [phone] Fax: [phone]  [place], [address] [place] [address] Phone: [phone] Fax: [place] [address] [place] [address] Phone: [phone] Fax: [phone]  Readmission Risk Interventions    11/08/2022    2:21 PM  Readmission Risk Prevention Plan  Transportation Screening Complete  PCP or Specialist Appt within 5-7 Days Complete  Home Care Screening Complete  Medication Review (RN CM) Complete

## 2022-11-30 NOTE — Anesthesia Procedure Notes (Signed)
Procedure Name: MAC Date/Time: 11/30/2022 4:17 PM  Performed by: Darletta Moll, CRNAPre-anesthesia Checklist: Patient identified, Emergency Drugs available, Suction available and Patient being monitored Patient Re-evaluated:Patient Re-evaluated prior to induction Oxygen Delivery Method: Nasal cannula

## 2022-11-30 NOTE — Congregational Nurse Program (Signed)
Dept: [phone]   Congregational Nurse Program Note  Date of Encounter: 11/30/2022  Past Medical History: Past Medical History:  Diagnosis Date   Acute coronary syndrome (HCC)    Anemia    B12 deficiency    Chest pain    CHF (congestive heart failure) (HCC)    Chronic combined systolic and diastolic CHF (congestive heart failure) ([place])    a. 07/2015 Echo: EF 45-50%, mod LVH, mid apical/antsept AK, Gr 1 DD.   Chronic low back pain    CKD (chronic kidney disease), stage III (HCC)    Constipation    COPD (chronic obstructive pulmonary disease) (HCC)    Coronary artery disease    a. 2012 s/p MI/PCI: s/p DES to mid/dist RCA and overlapping DES to LAD;  b. 07/2015 Cath: LAD 10% ISR, D1 40(jailed), LCX 54m, OM2 30, OM3 30, RCA 36m ISR, 10d ISR; c. 07/2016 NSTEMI/PCI: LAD 85ost/p/m (3.5x20 Synergy DES overlapping prior stent), D1 40, LCX 58m, OM2 95 (2.75x16 Synergy DES), OM3 30, RCA 65m, 10d.   Depression    Diabetic gastroparesis ([place])    Diabetic polyneuropathy ([place])    DKA (diabetic ketoacidoses) 03/14/2018   GERD (gastroesophageal reflux disease)    Gout    Heart failure (HCC)    Heart murmur    History of MI (myocardial infarction)    Hyperlipemia    Hypertension    Hypertensive heart disease    Hypokalemia    Ischemic cardiomyopathy    a. 07/2015 Echo: EF 45-50%, mod LVH, mid-apicalanteroseptal AK, Gr1 DD.   Joint pain    Kidney problem    Morbid obesity ([place])    OSA on CPAP    Osteoarthritis    Osteomyelitis ([place])    a. 07/2016 MTP joint of R great toe s/p transmetatarsal amputation.   Other fatigue    Polyneuropathy in diabetes(357.2)    Pulmonary sarcoidosis ([place])    "problems w/it years ago; no problems anymore" (07/03/2014)   Rectal bleeding    Shortness of breath    Shortness of breath on exertion    Sleep apnea    Stomach ulcer    Swallowing difficulty    Type II diabetes mellitus ([place])     Encounter Details:  CNP Questionnaire - 11/30/22 1049        Questionnaire   Ask client: Do you give verbal consent for me to treat you today? Yes    Student Assistance N/A    Location Patient Served  [employer]    Visit Setting with Client [place]    Patient Status Unhoused    Insurance Medicaid;Private or [place]    Insurance/Financial Assistance Referral N/A    Medication N/A    Screening Referrals Made N/A    Medical Referrals Made N/A    Food N/A    Transportation N/A    Housing/Utilities N/A    Interpersonal Safety N/A    Interventions Advocate/Support    Abnormal to Normal Screening Since Last CN Visit N/A    Screenings CN Performed N/A    ED Visit Averted N/A    Life-Saving Intervention Made N/A            [name] is currently hospitalized due to having complications with his left foot. A procedure will be done today in efforts to salvage his foot. He is very hopeful that he'll receive good news following the procedure. I reached out to the health care team as he had questions regarding not receiving dressing  changes to his foot and the time of the procedure. I updated him with the information I received. I will follow up after his procedure. [employer]ina [employer]

## 2022-11-30 NOTE — Transfer of Care (Signed)
Immediate Anesthesia Transfer of Care Note  Patient: Joshua [name]  Procedure(s) Performed: DEBRIDEMENT WOUND (Left) GRAFT APPLICATION (Left) APPLICATION OF WOUND VAC (Left)  Patient Location: PACU  Anesthesia Type:MAC  Level of Consciousness: alert  and patient cooperative  Airway & Oxygen Therapy: Patient Spontanous Breathing and Patient connected to nasal cannula oxygen  Post-op Assessment: Report given to RN, Post -op Vital signs reviewed and stable, and Patient moving all extremities X 4  Post vital signs: Reviewed and stable  Last Vitals:  Vitals Value Taken Time  BP 122/83 11/30/22 1717  Temp    Pulse 73 11/30/22 1719  Resp 19 11/30/22 1719  SpO2 99 % 11/30/22 1719  Vitals shown include unvalidated device data.  Last Pain:  Vitals:   11/30/22 1530  TempSrc: Oral  PainSc: 0-No pain      Patients Stated Pain Goal: 0 (52/71/29 2909)  Complications: No notable events documented.

## 2022-11-30 NOTE — Progress Notes (Addendum)
  Progress Note    11/30/2022 7:19 AM 1 Day Post-Op  Subjective:  sleeping, wakes easily; no complaints  afebrile  Vitals:   11/29/22 2348 11/30/22 0446  BP: 138/69 138/79  Pulse: 82 77  Resp: 18 16  Temp: 98.8 F (37.1 C) 98.4 F (36.9 C)  SpO2: 98% 99%    Physical Exam: General:  resting comfortably; no distress Cardiac:  regular Lungs:  non labored Incisions:  right groin is soft without hematoma Extremities:  palpable right DP pulse; left foot with ace wrap present.  Abdomen:  soft  CBC    Component Value Date/Time   WBC 7.2 11/30/2022 0209   RBC 3.71 (L) 11/30/2022 0209   HGB 10.3 (L) 11/30/2022 0209   HGB 12.3 (L) 08/11/2021 0929   HCT 33.0 (L) 11/30/2022 0209   HCT 39.1 08/11/2021 0929   PLT 245 11/30/2022 0209   PLT 254 08/11/2021 0929   MCV 88.9 11/30/2022 0209   MCV 84 08/11/2021 0929   MCH 27.8 11/30/2022 0209   MCHC 31.2 11/30/2022 0209   RDW 18.1 (H) 11/30/2022 0209   RDW 15.9 (H) 08/11/2021 0929   LYMPHSABS 1.6 11/26/2022 0411   LYMPHSABS 2.0 08/11/2021 0929   MONOABS 1.1 (H) 11/26/2022 0411   EOSABS 0.0 11/26/2022 0411   EOSABS 0.2 08/11/2021 0929   BASOSABS 0.0 11/26/2022 0411   BASOSABS 0.0 08/11/2021 0929    BMET    Component Value Date/Time   NA 135 11/30/2022 0209   NA 141 10/26/2021 1044   K 4.1 11/30/2022 0209   CL 108 11/30/2022 0209   CO2 21 (L) 11/30/2022 0209   GLUCOSE 160 (H) 11/30/2022 0209   BUN 45 (H) 11/30/2022 0209   BUN 33 (H) 10/26/2021 1044   CREATININE 1.66 (H) 11/30/2022 0209   CREATININE 2.11 (H) 11/18/2022 1605   CALCIUM 8.6 (L) 11/30/2022 0209   GFRNONAA 46 (L) 11/30/2022 0209   GFRNONAA 51 (L) 11/01/2016 1012   GFRAA 27 (L) 08/23/2019 0340   GFRAA 58 (L) 11/01/2016 1012    INR    Component Value Date/Time   INR 1.2 11/26/2022 0411     Intake/Output Summary (Last 24 hours) at 11/30/2022 0719 Last data filed at 11/30/2022 0450 Gross per 24 hour  Intake 250.94 ml  Output 1175 ml  Net -924.06  ml      Assessment/Plan:  66 y.o. male is s/p:  Angiogram of LLE via right CFA  1 Day Post-Op   -right groin is soft without hematoma and palpable right DP pulse.  Pt with occluded ATA just after the takeoff that does not reconstitute and has a large PTA, which is dominant runoff to the foot and peroneal artery to the level of the ankle.  No intervention and pt will need diligent wound care and remains high risk for left BKA.  Plan is for OR today with podiatry. -creatinine improved from yesterday to 1.66 from 1.8   Leontine Locket, PA-C [facility] [phone] 11/30/2022 7:19 AM   VASCULAR STAFF ADDENDUM: I have independently interviewed and examined the patient. I agree with the above.   [name]. Stanford Breed, MD [facility] Phone Number: [phone] 11/30/2022 9:45 PM

## 2022-11-30 NOTE — Op Note (Signed)
Patient Name: Joshua [name] DOB: [birth year]  MRN: [id number]   Date of Service: 11/25/2022 - 11/30/2022  Surgeon: Dr. Lanae Crumbly, DPM Assistants: None Pre-operative Diagnosis:  Osteomyelitis left foot Diabetic foot infection Nonhealing ulcer Post-operative Diagnosis:  Osteomyelitis left foot Diabetic foot infection Nonhealing ulcer Procedures:  1) partial resection metatarsal fifth  2) open bone biopsy of cuboid  3) preparation of wound bed for skin substitute  4) application of skin substitute  5) application negative pressure wound therapy Pathology/Specimens: ID Type Source Tests Collected by Time Destination  1 : 5th metatarsal Tissue PATH Other SURGICAL PATHOLOGY Criselda Peaches, DPM 11/30/2022 1646   2 : Left cuboid Tissue PATH Other SURGICAL PATHOLOGY Criselda Peaches, DPM 11/30/2022 1648   A : 5th metatarsal Tissue PATH Other FUNGUS CULTURE WITH STAIN, AEROBIC/ANAEROBIC CULTURE W Lonell Grandchild STAIN (SURGICAL/DEEP WOUND) Criselda Peaches, DPM 11/30/2022 1643   B : Left cuboid bone culture Tissue PATH Other FUNGUS CULTURE WITH STAIN, AEROBIC/ANAEROBIC CULTURE W Lonell Grandchild STAIN (SURGICAL/DEEP WOUND) Criselda Peaches, DPM 11/30/2022 1645    Anesthesia: MAC with local Hemostasis: * No tourniquets in log * Estimated Blood Loss: 100cc Materials:  Implant Name Type Inv. Item Serial No. Manufacturer Lot No. LRB No. Used Action  POWDER MYRIAD MORCELLS 500MG - [id number] Miscellaneous POWDER MYRIAD MORCELLS 500MG  [employer] [id number] Left 1 Implanted  GRAFT MYRIAD 3 LAYER 5X5 - [id number] Graft GRAFT MYRIAD 3 LAYER 5X5  [employer] [id number] Left 1 Implanted  POWDER MYRIAD MORCELLS 500MG - [id number] Miscellaneous POWDER MYRIAD MORCELLS 500MG  [employer] [id number] Left 1 Implanted   Medications: 20 cc 0.5% Marcaine plain Complications: No complication noted  Indications for Procedure:  This is a 66 y.o. male with a history of  uncontrolled type 2 diabetes, polyneuropathy and previous ulceration on the lateral fifth metatarsal.  Preoperative radiographs showed worsening fifth metatarsal osteomyelitis.  Debridement and resection of the infected portions of bone was recommended.  I discussed prior to the procedure the possible loss of the peroneus brevis I would attempt to transfer this if possible and the risk of cavovarus contracture deformity if this is not possible.   Procedure in Detail: Patient was identified in pre-operative holding area. Formal consent was signed and the left lower extremity was marked. Patient was brought back to the operating room. Anesthesia was induced. The extremity was prepped and draped in the usual sterile fashion. Timeout was taken to confirm patient name, laterality, and procedure prior to incision.   Attention was then directed to the left foot which exhibited a necrotic ulceration with fibrous exudate, green drainage and surrounding hyperkeratosis.  I began by full-thickness excisional debridement using a sharp scalpel of the wound of all nonviable tissues to remove the eschar in preparation for a skin substitute application.  The wound measurements postdebridement measured 4.5 x 4.5 x 2.5 cm.  The degree of necrosis was significant and included the insertion of the peroneus brevis on the fifth metatarsal base.  All the soft tissue that was nonviable was excised completely.  I then carried my dissection deep and raise the periosteum from the fifth metatarsal which was into fracture fragments.  These fracture fragments were dissected from their soft tissue attachments and sent for bone culture and pathology.  This revealed a cuboid that had soft appearing articular surface.  I then irrigated the wound thoroughly with 3 L of normal sterile saline.  Clean gloves and instrumentation was then used and a rongeur was used  to harvest an open bone biopsy of the cuboid which was also sent for bone culture and  bone path.  Hemostasis was achieved.  With the wound bed prepared with removal of all nonviable tissues and resection of the infected portions and biopsy completed I began by applying the skin substitute.  1 g of Myriad Morcels were placed into the wound bed to cover the bone.  A 5 cm x 5 cm Myriad matrix tissue substitute was then inset into the wound to cover this and secured with Prolene and skin staples.  Due to the depth the wound secondary closure will be necessary with a negative pressure wound therapy device.  This was applied and set to 125 mmHg continuous pressure.  The foot was then dressed with a well-padded below the knee posterior splint. Patient tolerated the procedure well.   Disposition: Following a period of post-operative monitoring, patient will be transferred to the floor.  He remains at high risk of limb loss and failure of the procedure.  The peroneus brevis tendon was nonviable and required excision.  If we are able to heal his wounds he will require further tendon transfer and/or long-term bracing to prevent cavovarus contracture.

## 2022-12-01 ENCOUNTER — Other Ambulatory Visit: Payer: Self-pay

## 2022-12-01 ENCOUNTER — Encounter (HOSPITAL_COMMUNITY): Payer: Self-pay | Admitting: Podiatry

## 2022-12-01 DIAGNOSIS — S81802A Unspecified open wound, left lower leg, initial encounter: Secondary | ICD-10-CM | POA: Diagnosis not present

## 2022-12-01 LAB — BASIC METABOLIC PANEL
Anion gap: 6 (ref 5–15)
BUN: 31 mg/dL — ABNORMAL HIGH (ref 8–23)
CO2: 23 mmol/L (ref 22–32)
Calcium: 8.5 mg/dL — ABNORMAL LOW (ref 8.9–10.3)
Chloride: 108 mmol/L (ref 98–111)
Creatinine, Ser: 1.82 mg/dL — ABNORMAL HIGH (ref 0.61–1.24)
GFR, Estimated: 41 mL/min — ABNORMAL LOW (ref >60)
Glucose, Bld: 220 mg/dL — ABNORMAL HIGH (ref 70–99)
Potassium: 4.1 mmol/L (ref 3.5–5.1)
Sodium: 137 mmol/L (ref 135–145)

## 2022-12-01 LAB — CBC
HCT: 31.6 % — ABNORMAL LOW (ref 39.0–52.0)
Hemoglobin: 10.1 g/dL — ABNORMAL LOW (ref 13.0–17.0)
MCH: 28.2 pg (ref 26.0–34.0)
MCHC: 32 g/dL (ref 30.0–36.0)
MCV: 88.3 fL (ref 80.0–100.0)
Platelets: 217 10*3/uL (ref 150–400)
RBC: 3.58 MIL/uL — ABNORMAL LOW (ref 4.22–5.81)
RDW: 18.5 % — ABNORMAL HIGH (ref 11.5–15.5)
WBC: 7.4 10*3/uL (ref 4.0–10.5)
nRBC: 0 % (ref 0.0–0.2)

## 2022-12-01 LAB — GLUCOSE, CAPILLARY
Glucose-Capillary: 164 mg/dL — ABNORMAL HIGH (ref 70–99)
Glucose-Capillary: 166 mg/dL — ABNORMAL HIGH (ref 70–99)
Glucose-Capillary: 203 mg/dL — ABNORMAL HIGH (ref 70–99)
Glucose-Capillary: 176 mg/dL — ABNORMAL HIGH (ref 70–99)
Glucose-Capillary: 173 mg/dL — ABNORMAL HIGH (ref 70–99)

## 2022-12-01 LAB — VITAMIN C: Vitamin C: 0.7 mg/dL (ref 0.4–2.0)

## 2022-12-01 LAB — VITAMIN A: Vitamin A (Retinoic Acid): 28.4 ug/dL (ref 22.0–69.5)

## 2022-12-01 MED ORDER — INSULIN ASPART 100 UNIT/ML IJ SOLN: 0.0000 [IU] | Freq: Every day | INTRAMUSCULAR | Status: DC

## 2022-12-01 MED ORDER — INSULIN ASPART 100 UNIT/ML IJ SOLN
0.0000 [IU] | Freq: Three times a day (TID) | INTRAMUSCULAR | Status: DC
  Administered 2022-12-02 (×2): 4 [IU] via SUBCUTANEOUS
  Administered 2022-12-03 – 2022-12-07 (×6): 3 [IU] via SUBCUTANEOUS

## 2022-12-01 MED ORDER — ALLOPURINOL 100 MG PO TABS
100.0000 mg | ORAL_TABLET | Freq: Every day | ORAL | Status: DC
  Administered 2022-12-01 – 2022-12-09 (×9): 100 mg via ORAL
  Filled 2022-12-01 (×9): qty 1

## 2022-12-01 NOTE — Progress Notes (Signed)
  Subjective:  Patient ID: Joshua [name], male    DOB: 03-09-1956,  MRN: [id number]  A 66 y.o. male presents with left foot osteomyeltis s/p left foot partial resection of the fifth metatarsal with open biopsy of the cuboid with application of skin substitute and negative pressure wound therapy postop day 1.  Patient states he is doing well no pain no nausea fever chills vomiting.  Has been nonweightbearing to the left lower extremity Objective:   Vitals:   12/01/22 0437 12/01/22 0700  BP: (!) 166/80 (!) 143/77  Pulse:  74  Resp: 15 16  Temp: 98.4 F (36.9 C) 97.8 F (36.6 C)  SpO2: 97% 98%   General AA&O x3. Normal mood and affect.  Vascular Dorsalis pedis and posterior tibial pulses 2/4 bilat. Brisk capillary refill to all digits. Pedal hair present.  Neurologic Epicritic sensation grossly intact.  Dermatologic Bandages clean dry intact.  No signs of strikethrough no calf pain noted motor or sensory functions are intact  Orthopedic: MMT 5/5 in dorsiflexion, plantarflexion, inversion, and eversion. Normal joint ROM without pain or crepitus.    Assessment & Plan:  Patient was evaluated and treated and all questions answered.  Left foot osteomyelitis status post left foot partial resection fifth metatarsal base with biopsy of the cuboid and application of negative pressure wound therapy with skin substitute -All questions and concerns were discussed with the patient in extensive detail -Patient would benefit from infectious disease consult.  Multiple cultures were taken in the OR awaiting results.  Patient may need PICC line -Nonweightbearing to the left lower extremity -He will need dressing changes likely Friday versus Saturday. -He will need to be set up for wound VAC as well. -Podiatry to continue to follow   Felipa Furnace, DPM  Accessible via secure chat for questions or concerns.

## 2022-12-01 NOTE — Anesthesia Postprocedure Evaluation (Signed)
Anesthesia Post Note  Patient: Joshua [name]  Procedure(s) Performed: DEBRIDEMENT WOUND (Left) GRAFT APPLICATION (Left) APPLICATION OF WOUND VAC (Left)     Patient location during evaluation: PACU Anesthesia Type: MAC Level of consciousness: awake and alert Pain management: pain level controlled Vital Signs Assessment: post-procedure vital signs reviewed and stable Respiratory status: spontaneous breathing, nonlabored ventilation and respiratory function stable Cardiovascular status: stable and blood pressure returned to baseline Anesthetic complications: no   No notable events documented.  Last Vitals:  Vitals:   12/01/22 0437 12/01/22 0700  BP: (!) 166/80 (!) 143/77  Pulse:  74  Resp: 15 16  Temp: 36.9 C 36.6 C  SpO2: 97% 98%    Last Pain:  Vitals:   12/01/22 0800  TempSrc:   PainSc: 0-No pain                 [name]

## 2022-12-01 NOTE — TOC Progression Note (Signed)
Transition of Care [facility]) - Progression Note    Patient Details  Name: Joshua [name] MRN: [id number] Date of Birth: 1956-09-01  [facility] CM/SW Contact  Joanne Chars, LCSW Phone Number: 12/01/2022, 2:01 PM  Clinical Narrative:   CSW spoke with pt regarding PT recommendations for SNF.  Pt agreeable, choice document provided. Permission given to send out referral in hub.  Pt is vaccinated for covid with one booster.  Referral sent out in hub for SNF.    Planned Disposition: Home Barriers to Discharge: Continued Medical Work up  Expected Discharge Plan and Services In-house Referral: Clinical Social Work Discharge Planning Services: CM Consult   Living arrangements for the past 2 months: Hotel/Motel (extended stay-)                           HH Arranged: RN, Disease Management, PT, OT HH Agency: [place] Date [place]: 11/30/22 Time [place]: 1528 Representative spoke with at [place]: [place] ([place]) Interventions [place]: Food Insecurity Present (11/30/2022)  Housing: High Risk (11/26/2022)  Transportation Needs: Unmet Transportation Needs (11/26/2022)  Utilities: Not At Risk (11/26/2022)  Depression (PHQ2-9): High Risk (08/26/2022)  Tobacco Use: Low Risk  (12/01/2022)    Readmission Risk Interventions    11/08/2022    2:21 PM  Readmission Risk Prevention Plan  Transportation Screening Complete  PCP or Specialist Appt within 5-7 Days Complete  Home Care Screening Complete  Medication Review (RN CM) Complete

## 2022-12-01 NOTE — NC FL2 (Signed)
[place] MEDICAID FL2 LEVEL OF CARE FORM     IDENTIFICATION  Patient Name: MARKO SKALSKI Birthdate: 1956-08-08 Sex: male Admission Date (Current Location): 11/25/2022  [place] and [place] Number:  Kathleen Argue [phone] [place] and Address:  [facility] [place] [address] [place], [place] [address]      Provider Number: [id number]  Attending Physician Name and Address:  [name], MD  Relative Name and Phone Number:  Ramari, Bray [phone]    Current Level of Care: Hospital Recommended Level of Care: [place] Prior Approval Number:    Date Approved/Denied:   PASRR Number: [id number]  Discharge Plan: SNF    Current Diagnoses: Patient Active Problem List   Diagnosis Date Noted   Acute osteomyelitis of metatarsal bone of left foot ([place]) 11/30/2022   Left foot pain 11/26/2022   Protein-calorie malnutrition, severe 11/08/2022   Osteomyelitis ([place]) 11/06/2022   Diabetic foot infection ([place]) 11/06/2022   Osteomyelitis of fifth toe of left foot ([place]) 11/05/2022   (HFpEF) heart failure with preserved ejection fraction ([place]) 11/05/2022   CKD (chronic kidney disease), stage IV ([place]) 11/05/2022   Dysphagia 03/16/2022   History of colonic polyps 03/16/2022   Spinal stenosis of lumbar region 11/03/2021   Mixed diabetic hyperlipidemia associated with type 2 diabetes mellitus ([place]) 08/11/2021   Chronic low back pain    Hypokalemia 09/25/2020   Insulin dependent type 2 diabetes mellitus ([place]) 09/24/2020   Unstable angina ([place]) 08/17/2019   Cardiac chest pain 08/16/2019   NSTEMI (non-ST elevated myocardial infarction) ([place]) 04/06/2019   ACS (acute coronary syndrome) ([place]) 04/06/2019   Sepsis ([place]) 03/15/2018   DKA (diabetic ketoacidoses) 03/15/2018   Preop cardiovascular exam 08/22/2017   Acute renal failure superimposed on stage 3 chronic kidney disease ([place]) 05/19/2017   Essential hypertension    Status post  transmetatarsal amputation of foot, right ([place]) 11/08/2016   Diabetic polyneuropathy associated with type 2 diabetes mellitus ([place]) 11/08/2016   Pure hypercholesterolemia    AKI (acute kidney injury) ([place])    Acute blood loss anemia    DM2 (diabetes mellitus, type 2) ([place])    Physical debility 08/03/2016   Chronic osteomyelitis of right foot ([place])    Cellulitis and abscess of leg, except foot    Non-healing wound of left lower extremity    Stage 3b chronic kidney disease (CKD) ([place])    Unstable angina pectoris ([place]) 07/18/2016   Chronic diastolic CHF (congestive heart failure) (HCC)    Chest pain with moderate risk for cardiac etiology 06/24/2014   CAD S/P multiple PCIs 06/24/2014   Sleep apnea- on C-pap 06/24/2014   Class 3 severe obesity with serious comorbidity and body mass index (BMI) of 45.0 to 49.9 in adult [facility]) 11/10/2013   Hyperglycemia 10/29/2013   Hyperlipidemia 01/12/2011   Benign essential HTN 01/12/2011   GERD 01/12/2011   Acute osteomyelitis of hindfoot, left ([place]) 01/12/2011    Orientation RESPIRATION BLADDER Height & Weight     Self, Time, Situation, Place  Normal Continent Weight: 286 lb 9.6 oz (130 kg) Height:  $Remove'5\' 9"'[id number]$  (175.3 cm)  BEHAVIORAL SYMPTOMS/MOOD NEUROLOGICAL BOWEL NUTRITION STATUS      Continent Diet (see discharge summary)  AMBULATORY STATUS COMMUNICATION OF NEEDS Skin   Total Care Verbally Surgical wounds, Wound Vac                       Personal Care Assistance Level of Assistance  Bathing, Feeding, Dressing Bathing  Assistance: Limited assistance Feeding assistance: Independent Dressing Assistance: Limited assistance     Functional Limitations Info  Sight, Hearing, Speech Sight Info: Adequate Hearing Info: Adequate Speech Info: Adequate    SPECIAL CARE FACTORS FREQUENCY  PT (By licensed PT), OT (By licensed OT)     PT Frequency: 5x week OT Frequency: 5x week            Contractures Contractures Info: Not present     Additional Factors Info  Code Status, Allergies, Insulin Sliding Scale Code Status Info: full Allergies Info: Chlorthalidone   Insulin Sliding Scale Info: Novolog: see discharge summary       Current Medications (12/01/2022):  This is the current hospital active medication list Current Facility-Administered Medications  Medication Dose Route Frequency Provider Last Rate Last Admin   0.9 %  sodium chloride infusion  250 mL Intravenous PRN Criselda Peaches, DPM   Paused at 11/30/22 2354   acetaminophen (TYLENOL) tablet 650 mg  650 mg Oral Q6H PRN Criselda Peaches, DPM       Or   acetaminophen (TYLENOL) suppository 650 mg  650 mg Rectal Q6H PRN Criselda Peaches, DPM       acetaminophen (TYLENOL) tablet 650 mg  650 mg Oral Q4H PRN Criselda Peaches, DPM       allopurinol (ZYLOPRIM) tablet 100 mg  100 mg Oral Daily [name], MD   100 mg at 12/01/22 1226   ascorbic acid (VITAMIN C) tablet 500 mg  500 mg Oral BID Criselda Peaches, DPM   500 mg at 12/01/22 8366   aspirin EC tablet 81 mg  81 mg Oral Daily Criselda Peaches, DPM   81 mg at 12/01/22 0906   atorvastatin (LIPITOR) tablet 80 mg  80 mg Oral Daily Criselda Peaches, DPM   80 mg at 12/01/22 0906   cefTRIAXone (ROCEPHIN) 2 g in sodium chloride 0.9 % 100 mL IVPB  2 g Intravenous Q24H Criselda Peaches, DPM 0 mL/hr at 11/29/22 1744 2 g at 11/30/22 1624   enoxaparin (LOVENOX) injection 60 mg  60 mg Subcutaneous Q24H Criselda Peaches, DPM   60 mg at 12/01/22 1226   fentaNYL (SUBLIMAZE) injection 50 mcg  50 mcg Intravenous Q2H PRN Criselda Peaches, DPM       gabapentin (NEURONTIN) capsule 100 mg  100 mg Oral QHS Criselda Peaches, DPM   100 mg at 11/30/22 2154   hydrALAZINE (APRESOLINE) injection 5 mg  5 mg Intravenous Q20 Min PRN McDonald, Stephan Minister, DPM       influenza vac split quadrivalent PF (FLUARIX) injection 0.5 mL  0.5 mL Intramuscular Tomorrow-1000 Regalado, Belkys A, MD       insulin aspart (novoLOG) injection 0-15 Units  0-15 Units  Subcutaneous TID WC Criselda Peaches, DPM   3 Units at 12/01/22 1226   insulin aspart (novoLOG) injection 0-5 Units  0-5 Units Subcutaneous QHS Criselda Peaches, DPM   2 Units at 11/29/22 2355   insulin aspart (novoLOG) injection 5 Units  5 Units Subcutaneous TID WC Criselda Peaches, DPM   5 Units at 12/01/22 1226   insulin glargine-yfgn (SEMGLEE) injection 10 Units  10 Units Subcutaneous QHS Criselda Peaches, DPM   10 Units at 11/30/22 2154   insulin glargine-yfgn [facility]) injection 30 Units  30 Units Subcutaneous Daily Criselda Peaches, DPM   30 Units at 12/01/22 0905   isosorbide mononitrate (IMDUR) 24 hr tablet 180 mg  180  mg Oral Daily Criselda Peaches, DPM   180 mg at 12/01/22 4037   labetalol (NORMODYNE) injection 10 mg  10 mg Intravenous Q10 min PRN Criselda Peaches, DPM       linezolid (ZYVOX) IVPB 600 mg  600 mg Intravenous Q12H Criselda Peaches, DPM 300 mL/hr at 12/01/22 0912 600 mg at 12/01/22 0912   melatonin tablet 3 mg  3 mg Oral QHS PRN Criselda Peaches, DPM   3 mg at 11/30/22 2154   metoprolol succinate (TOPROL-XL) 24 hr tablet 75 mg  75 mg Oral Daily Criselda Peaches, DPM   75 mg at 12/01/22 0906   metroNIDAZOLE (FLAGYL) IVPB 500 mg  500 mg Intravenous Q12H Criselda Peaches, DPM 100 mL/hr at 12/01/22 0433 500 mg at 12/01/22 0433   multivitamin with minerals tablet 1 tablet  1 tablet Oral Daily Criselda Peaches, DPM   1 tablet at 12/01/22 5436   naloxone [facility]) injection 0.4 mg  0.4 mg Intravenous PRN Criselda Peaches, DPM       nitroGLYCERIN (NITROSTAT) SL tablet 0.4 mg  0.4 mg Sublingual Q5 min PRN Criselda Peaches, DPM   0.4 mg at 11/27/22 1205   nutrition supplement (JUVEN) (JUVEN) powder packet 1 packet  1 packet Oral BID BM Criselda Peaches, DPM   1 packet at 12/01/22 0906   ondansetron (ZOFRAN) injection 4 mg  4 mg Intravenous Q6H PRN Criselda Peaches, DPM       ondansetron (ZOFRAN) injection 4 mg  4 mg Intravenous Q6H PRN Criselda Peaches, DPM       oxyCODONE (Oxy  IR/ROXICODONE) immediate release tablet 5-10 mg  5-10 mg Oral Q4H PRN Criselda Peaches, DPM   10 mg at 11/29/22 2154   oxyCODONE-acetaminophen (PERCOCET/ROXICET) 5-325 MG per tablet 1-2 tablet  1-2 tablet Oral Q6H PRN Criselda Peaches, DPM   2 tablet at 11/30/22 1845   protein supplement (ENSURE MAX) liquid  11 oz Oral QHS Criselda Peaches, DPM   11 oz at 11/30/22 2150   sodium chloride flush (NS) 0.9 % injection 3 mL  3 mL Intravenous Q12H Criselda Peaches, DPM   3 mL at 12/01/22 0906   sodium chloride flush (NS) 0.9 % injection 3 mL  3 mL Intravenous PRN Criselda Peaches, DPM       zinc sulfate capsule 220 mg  220 mg Oral Daily Criselda Peaches, DPM   220 mg at 12/01/22 0677     Discharge Medications: Please see discharge summary for a list of discharge medications.  Relevant Imaging Results:  Relevant Lab Results:   Additional Information SSN: [id number], Wound Vac needs, pt is vaccinated for covid with one booster.  Joanne Chars, LCSW

## 2022-12-01 NOTE — Plan of Care (Signed)

## 2022-12-01 NOTE — Progress Notes (Signed)
[facility] PROGRESS NOTE  Joshua [name] (DOB: 10-24-1956) OHF:[id number] PCP: [name], MD  Brief Narrative: Joshua [name] is a 66 y.o. male with a history of poorly healing wound of the left lateral foot, diabetes type 2 complicated by diabetic poorly neuropathy, chronic diastolic heart failure, CKD stage IIIb, creatinine baseline 2--2.4, hypertension, hyperlipidemia who presents with nonhealing wound of the left foot after refer for admission from podiatry clinic.  Dr. Crist Fat for podiatry is recommending left BKA.  Vascular consulted, plan for angiogram with CO2 contrast 12/18.  Subjective: Pain controlled, concerned about trying to maintain this wound care and antibiotics if discharged to the street. No chest pain or other new complaints.   Objective: BP (!) 143/77 (BP Location: Right Arm)   Pulse 74   Temp 97.8 F (36.6 C) (Oral)   Resp 16   Ht 5\' 9"  (1.753 m)   Wt 130 kg   SpO2 98%   BMI 42.32 kg/m   Gen: No distress Pulm: Clear, nonlabored  CV: Distant, RRR, no MRG GI: Soft, NT, ND, +BS  Neuro: Alert and oriented. Decreased distal LE sensation. No new focal deficits. Ext: Warm, dry, NVI in left foot/toes.  Skin: Wound vac in place. No other/new rashes, lesions or ulcers on visualized skin   Assessment & Plan: Principal Problem:   Non-healing wound of left lower extremity Active Problems:   Hyperlipidemia   Acute osteomyelitis of hindfoot, left (HCC)   Chronic diastolic CHF (congestive heart failure) (HCC)   Stage 3b chronic kidney disease (CKD) (HCC)   DM2 (diabetes mellitus, type 2) (HCC)   Essential hypertension   Preop cardiovascular exam   Cardiac chest pain   Acute osteomyelitis of metatarsal bone of left foot (HCC)  Nonhealing diabetic foot ulcer with osteomyelitis: s/p debridement 11/25 and oral antibiotics, worsened. BKA was recommended, as attempt at limb salvage, now s/p partial resection of left 5th MT with skin substitute and wound vac  placement on 12/19. - Continue broad IV antibiotics pending culture data, including cuboid Bx.  - Negative gram stain from 5th MT and cuboid, No growth < 24 hours. Note Proteus (amp and bactrim resistant) growth from left foot bone 11/26. - Will involve ID as cultures result.  - Podiatry following, continue wound vac, will go to SNF with patient it appears.  - NWB LLE  PAD s/p right TMA 2017: Arteriogram 12/18 with occluded left anterior tibial artery without reconstitution, dominant large posterior tibial artery noted.  - Antiplatelet, statin  IDT2DM with diabetic neuropathy: HbA1c 7.6%.  - Continue glargine 30u qAM, 10u qPM (can likely combine these doses), 5u TIDWC novolog + SSI. Will augment SSI to resistant scale. - Continue gabapentin.   Poor SDOH profile, housing insecurity: Without addressing current homelessness, do not suspect any efforts at limb salvage will be fruitful.   - CSW consulted. Will pursue SNF placement at discharge.  Stage IIIb CKD: SCr stable, With his weight, CrCl actually appears to be more within IIIa range. - Avoid nephrotoxins. Had CO2 arteriogram.   CAD s/p multiple PCI, HTN, HLD: Demand myocardial ischemia noted this admission.  - Continue ASA, restart brilinta when ok per podiatry.  - Continue atorvastatin 80mg . LDL is 33, HDL is 43.  - Continue metoprolol, imdur.   OSA:  - CPAP qHS encouraged  AOCD: Monitor CBC intermittently.  Gout: Quiescent.  - Restart home allopurinol  Morbid obesity: Body mass index is 42.32 kg/m.   [name], MD [facility] [email or web address] 12/01/2022,  5:49 PM

## 2022-12-01 NOTE — Evaluation (Signed)
Physical Therapy Evaluation Patient Details Name: Joshua [name] MRN: [id number] DOB: 01/16/1956 Today's Date: 12/01/2022  History of Present Illness  66 y.o. male  who is admitted to [facility] on 11/25/2022 with nonhealing wound of the left foot after presenting from podiatry clinic to [facility] ED complaining of such. Found to have poor circulation and osteomyelitis S/p 12/18 ultrasound guided cannulation of R common femoral artery and 12/19 partial resection of L 5th metatarsal  and skin graft PMH: poorly healing wound to the left lateral foot, type 2 diabetes mellitus complicated by diabetic peripheral polyneuropathy, chronic diastolic heart failure, CKD 3B with baseline creatinine range 2-2.4, essential hypertension, hyperlipidemia,  Clinical Impression  PTA pt living in boarding room with bathroom 30 feet down hall. All belongings in his car which he was living out of prior. Pt reports he was using his [name] for ambulation and was independent in his ADLs. Pt is currently limited in safe mobility by L LE NWB restrictions, decreased L foot skin integrity, and generalized weakness. Pt is min A for bed mobility and maxA for sit to stand and modA for hop pivot to chair. PT recommending SNF level rehab at discharge for strengthening and L LE protection in order to provide best chance for salvage. PT will continue to follow acutely.     Recommendations for follow up therapy are one component of a multi-disciplinary discharge planning process, led by the attending physician.  Recommendations may be updated based on patient status, additional functional criteria and insurance authorization.  Follow Up Recommendations Skilled nursing-short term rehab (<3 hours/day) Can patient physically be transported by private vehicle: Yes    Assistance Recommended at Discharge Intermittent Supervision/Assistance  Patient can return home with the following  Other (comment) (assist for wound care)    Equipment  Recommendations Wheelchair (measurements PT);Wheelchair cushion (measurements PT)  Recommendations for Other Services  OT consult    Functional Status Assessment Patient has had a recent decline in their functional status and demonstrates the ability to make significant improvements in function in a reasonable and predictable amount of time.     Precautions / Restrictions Precautions Precautions: Fall Precaution Comments: L foot wound vac in place Required Braces or Orthoses: Other Brace Splint/Cast - Date Prophylactic Dressing Applied (if applicable): 51/70/01 Other Brace: R post op shoe Restrictions Weight Bearing Restrictions: Yes LLE Weight Bearing: Non weight bearing      Mobility  Bed Mobility Overal bed mobility: Modified Independent, Needs Assistance             General bed mobility comments: HoB elevated and use of bed rail to pull to EoB, min A for management of lines    Transfers Overall transfer level: Needs assistance Equipment used: Rolling walker (2 wheels) Transfers: Sit to/from Stand, Bed to chair/wheelchair/BSC Sit to Stand: From elevated surface, Max assist   Step pivot transfers: Mod assist       General transfer comment: pt with increased difficulty maintaining NWB in L LE with power up into standing, once in standing able to hop from bed to chair with L LE NWB, requires maxA for power up and steadying and modA for steadying with hopping       Balance Overall balance assessment: Needs assistance Sitting-balance support: Feet supported, No upper extremity supported Sitting balance-Leahy Scale: Normal Sitting balance - Comments: able to don sock on R foot   Standing balance support: Bilateral upper extremity supported, Reliant on assistive device for balance Standing balance-Leahy Scale: Poor Standing  balance comment: requires increased assist to maintain L LE NWB and balance                             Pertinent Vitals/Pain Pain  Assessment Pain Assessment: No/denies pain    Home Living Family/patient expects to be discharged to:: Shelter/Homeless                   Additional Comments: Pt lives in community housing project with lighting and heat; community bathroom 30 yards away, no running water or outlets in his shelter. Pt does have a car. Has been showering at son's house.    Prior Function Prior Level of Function : Independent/Modified Independent             Mobility Comments: using SPC for ambulation in last month       Hand Dominance   Dominant Hand: Right    Extremity/Trunk Assessment   Upper Extremity Assessment Upper Extremity Assessment: Overall WFL for tasks assessed    Lower Extremity Assessment RLE Deficits / Details: prior TMA, hip/knee WFL LLE Deficits / Details: L hip and knee ROM WFL, L foot wound vac in place    Cervical / Trunk Assessment Cervical / Trunk Assessment: Normal  Communication   Communication: No difficulties  Cognition Arousal/Alertness: Awake/alert Behavior During Therapy: WFL for tasks assessed/performed Overall Cognitive Status: Within Functional Limits for tasks assessed                                          General Comments General comments (skin integrity, edema, etc.): VSS on RA , wound vac in place moderate amount of drainage in canister        Assessment/Plan    PT Assessment Patient needs continued PT services  PT Problem List Decreased activity tolerance;Decreased balance;Decreased mobility;Decreased coordination       PT Treatment Interventions DME instruction;Gait training;Functional mobility training;Therapeutic activities;Therapeutic exercise;Balance training    PT Goals (Current goals can be found in the Care Plan section)  Acute Rehab PT Goals Patient Stated Goal: keep L LE PT Goal Formulation: With patient Time For Goal Achievement: 12/15/22 Potential to Achieve Goals: Good    Frequency Min  3X/week        AM-PAC PT "6 Clicks" Mobility  Outcome Measure Help needed turning from your back to your side while in a flat bed without using bedrails?: None Help needed moving from lying on your back to sitting on the side of a flat bed without using bedrails?: A Little Help needed moving to and from a bed to a chair (including a wheelchair)?: A Lot Help needed standing up from a chair using your arms (e.g., wheelchair or bedside chair)?: A Lot Help needed to walk in hospital room?: Total Help needed climbing 3-5 steps with a railing? : Total 6 Click Score: 13    End of Session Equipment Utilized During Treatment: Gait belt Activity Tolerance: Patient tolerated treatment well Patient left: in chair;with call bell/phone within reach;with chair alarm set Nurse Communication: Mobility status PT Visit Diagnosis: Unsteadiness on feet (R26.81);Other abnormalities of gait and mobility (R26.89);Difficulty in walking, not elsewhere classified (R26.2) Pain - Right/Left: Left Pain - part of body: Ankle and joints of foot    Time: 0820-0850 PT Time Calculation (min) (ACUTE ONLY): 30 min   Charges:   PT Evaluation $  PT Eval Moderate Complexity: 1 Mod PT Treatments $Therapeutic Activity: 8-22 mins        Annalicia Renfrew B. Migdalia Dk PT, DPT Acute Rehabilitation Services Please use secure chat or  Call Office [phone]   [place] 12/01/2022, 10:24 AM

## 2022-12-02 DIAGNOSIS — L97509 Non-pressure chronic ulcer of other part of unspecified foot with unspecified severity: Secondary | ICD-10-CM | POA: Diagnosis not present

## 2022-12-02 DIAGNOSIS — E1169 Type 2 diabetes mellitus with other specified complication: Secondary | ICD-10-CM

## 2022-12-02 DIAGNOSIS — E08621 Diabetes mellitus due to underlying condition with foot ulcer: Secondary | ICD-10-CM

## 2022-12-02 DIAGNOSIS — S81802A Unspecified open wound, left lower leg, initial encounter: Secondary | ICD-10-CM | POA: Diagnosis not present

## 2022-12-02 DIAGNOSIS — M8618 Other acute osteomyelitis, other site: Secondary | ICD-10-CM

## 2022-12-02 DIAGNOSIS — E11621 Type 2 diabetes mellitus with foot ulcer: Secondary | ICD-10-CM | POA: Diagnosis not present

## 2022-12-02 LAB — GLUCOSE, CAPILLARY
Glucose-Capillary: 119 mg/dL — ABNORMAL HIGH (ref 70–99)
Glucose-Capillary: 190 mg/dL — ABNORMAL HIGH (ref 70–99)
Glucose-Capillary: 165 mg/dL — ABNORMAL HIGH (ref 70–99)
Glucose-Capillary: 173 mg/dL — ABNORMAL HIGH (ref 70–99)

## 2022-12-02 LAB — SURGICAL PATHOLOGY

## 2022-12-02 LAB — HEMOGLOBIN A1C
Hgb A1c MFr Bld: 8.4 % — ABNORMAL HIGH (ref 4.8–5.6)
Mean Plasma Glucose: 194 mg/dL

## 2022-12-02 MED ORDER — SODIUM CHLORIDE 0.9 % IV SOLN
2.0000 g | INTRAVENOUS | Status: DC
  Filled 2022-12-02: qty 20

## 2022-12-02 MED ORDER — TICAGRELOR 90 MG PO TABS
90.0000 mg | ORAL_TABLET | Freq: Two times a day (BID) | ORAL | Status: DC
  Administered 2022-12-02 – 2022-12-09 (×14): 90 mg via ORAL
  Filled 2022-12-02 (×16): qty 1

## 2022-12-02 MED ORDER — METRONIDAZOLE 500 MG/100ML IV SOLN
500.0000 mg | Freq: Two times a day (BID) | INTRAVENOUS | Status: DC
  Administered 2022-12-02 – 2022-12-07 (×10): 500 mg via INTRAVENOUS
  Filled 2022-12-02 (×11): qty 100

## 2022-12-02 MED ORDER — ENSURE MAX PROTEIN PO LIQD
11.0000 [oz_av] | Freq: Two times a day (BID) | ORAL | Status: DC
  Administered 2022-12-02 – 2022-12-09 (×7): 11 [oz_av] via ORAL
  Filled 2022-12-02 (×15): qty 330

## 2022-12-02 MED ORDER — VITAMIN C 500 MG PO TABS
250.0000 mg | ORAL_TABLET | Freq: Two times a day (BID) | ORAL | Status: DC
  Administered 2022-12-02 – 2022-12-09 (×14): 250 mg via ORAL
  Filled 2022-12-02 (×14): qty 1

## 2022-12-02 MED ORDER — INSULIN ASPART 100 UNIT/ML IJ SOLN
7.0000 [IU] | Freq: Three times a day (TID) | INTRAMUSCULAR | Status: DC
  Administered 2022-12-02 – 2022-12-08 (×8): 7 [IU] via SUBCUTANEOUS
  Administered 2022-12-09: 3 [IU] via SUBCUTANEOUS

## 2022-12-02 MED ORDER — SODIUM CHLORIDE 0.9 % IV SOLN
2.0000 g | Freq: Two times a day (BID) | INTRAVENOUS | Status: DC
  Administered 2022-12-02 – 2022-12-06 (×9): 2 g via INTRAVENOUS
  Filled 2022-12-02 (×9): qty 12.5

## 2022-12-02 NOTE — Consult Note (Signed)
[facility]    Date of Admission:  11/25/2022     Reason for Consult: diabetic foot om     Referring Provider: Bonner Puna  Abx: 12/21-c cefepime 12/15-c linezolid      12/15-c metronidazole  12/15-21 ceftriaxone  Assessment: 66 yo male poor living situation, dm2, ckd4, cad, recent I&D of left 5th mt for chronic ulcer associated om (bone cx non-esbl proteus) and on tx doxy/amox-clav, readmitted 12/14 for treatment failure  He underwent repeat I&D again with more proximal 5th mt amputation on 12/19 and cx so far growing pseudomonas    Plan: Change abx to empiric cefepime to cover pseudomonas, and might have to change again pending susceptibility testing Continue linezolid and flagyl Await final cx/sensitivity Plan 6 week tx starting 12/20 Discussed with primary team   I spent 75 minute reviewing data/chart, and coordinating care and >50% direct face to face time providing counseling/discussing diagnostics/treatment plan with patient       ------------------------------------------------ Principal Problem:   Non-healing wound of left lower extremity Active Problems:   Hyperlipidemia   Acute osteomyelitis of hindfoot, left (HCC)   Chronic diastolic CHF (congestive heart failure) ([place])   Stage 3b chronic kidney disease (CKD) ([place])   DM2 (diabetes mellitus, type 2) ([place])   Essential hypertension   Preop cardiovascular exam   Cardiac chest pain   Acute osteomyelitis of metatarsal bone of left foot (HCC)    HPI: Joshua [name] is a 66 y.o. male poor living situation, dm2, ckd4, cad, recent I&D of left 5th mt for chronic ulcer associated om (bone cx non-esbl proteus) and on tx doxy/amox-clav, readmitted 12/14 for treatment failure  Patient has been following me in rcid along with podiatry  He was seen 12/07 for integra graft break down and crp was elevated in setting nonhealing ulcer. We continue doxy/augmentin but then on f/u with  podiatry it appears the wound continue non-healing and there was more discharge so he was admitted 12/14 for semi-elective debridement  He was started on linezolid/ceftriaxone/flagyl and underwent I&D with more proximal 5th mt amputation and cuboid bone biopsy with more skin substitue graft and wound vac placement  He also underwent 12/18 vascular diagnostic angiogram and no intervention was deemed needed: "Findings: The aorta and iliac segments are free of flow-limiting stenosis.  Left common femoral and SFA are patent with brisk flow using CO2.  Contrast below the knee demonstrates an occluded anterior tibial artery just after the takeoff that does not reconstitute with very large posterior tibial artery which is the dominant runoff to the foot and peroneal artery to the level of the ankle.   Patient will need diligent wound care and remains high risk for below-knee amputation."   Family History  Problem Relation Age of Onset   Sudden death Mother    Diabetes Maternal Grandmother    Hypertension Maternal Grandmother    Heart attack Maternal Aunt    Sudden death Other     Social History   Tobacco Use   Smoking status: Never   Smokeless tobacco: Never  Vaping Use   Vaping Use: Never used  Substance Use Topics   Alcohol use: Yes    Alcohol/week: 6.0 standard drinks of alcohol    Types: 6 Cans of beer per week    Comment: social binge drinking per patinet   Drug use: No    Allergies  Allergen Reactions   Chlorthalidone Other (See Comments)  Causes dehydration, kidneys shut down    Review of Systems: ROS All Other ROS was negative, except mentioned above   Past Medical History:  Diagnosis Date   Acute coronary syndrome (HCC)    Anemia    B12 deficiency    Chest pain    CHF (congestive heart failure) (HCC)    Chronic combined systolic and diastolic CHF (congestive heart failure) ([place])    a. 07/2015 Echo: EF 45-50%, mod LVH, mid apical/antsept AK, Gr 1 DD.   Chronic  low back pain    CKD (chronic kidney disease), stage III (HCC)    Constipation    COPD (chronic obstructive pulmonary disease) (HCC)    Coronary artery disease    a. 2012 s/p MI/PCI: s/p DES to mid/dist RCA and overlapping DES to LAD;  b. 07/2015 Cath: LAD 10% ISR, D1 40(jailed), LCX 76m, OM2 30, OM3 30, RCA 76m ISR, 10d ISR; c. 07/2016 NSTEMI/PCI: LAD 85ost/p/m (3.5x20 Synergy DES overlapping prior stent), D1 40, LCX 32m, OM2 95 (2.75x16 Synergy DES), OM3 30, RCA 89m, 10d.   Depression    Diabetic gastroparesis ([place])    Diabetic polyneuropathy ([place])    DKA (diabetic ketoacidoses) 03/14/2018   GERD (gastroesophageal reflux disease)    Gout    Heart failure (HCC)    Heart murmur    History of MI (myocardial infarction)    Hyperlipemia    Hypertension    Hypertensive heart disease    Hypokalemia    Ischemic cardiomyopathy    a. 07/2015 Echo: EF 45-50%, mod LVH, mid-apicalanteroseptal AK, Gr1 DD.   Joint pain    Kidney problem    Morbid obesity ([place])    OSA on CPAP    Osteoarthritis    Osteomyelitis ([place])    a. 07/2016 MTP joint of R great toe s/p transmetatarsal amputation.   Other fatigue    Polyneuropathy in diabetes(357.2)    Pulmonary sarcoidosis (HCC)    "problems w/it years ago; no problems anymore" (07/03/2014)   Rectal bleeding    Shortness of breath    Shortness of breath on exertion    Sleep apnea    Stomach ulcer    Swallowing difficulty    Type II diabetes mellitus (HCC)        Scheduled Meds:  allopurinol  100 mg Oral Daily   ascorbic acid  250 mg Oral BID   aspirin EC  81 mg Oral Daily   atorvastatin  80 mg Oral Daily   enoxaparin (LOVENOX) injection  60 mg Subcutaneous Q24H   gabapentin  100 mg Oral QHS   influenza vac split quadrivalent PF  0.5 mL Intramuscular Tomorrow-1000   insulin aspart  0-20 Units Subcutaneous TID WC   insulin aspart  0-5 Units Subcutaneous QHS   insulin aspart  7 Units Subcutaneous TID WC   insulin glargine-yfgn  10 Units  Subcutaneous QHS   insulin glargine-yfgn  30 Units Subcutaneous Daily   isosorbide mononitrate  180 mg Oral Daily   metoprolol succinate  75 mg Oral Daily   multivitamin with minerals  1 tablet Oral Daily   nutrition supplement (JUVEN)  1 packet Oral BID BM   Ensure Max Protein  11 oz Oral BID   sodium chloride flush  3 mL Intravenous Q12H   ticagrelor  90 mg Oral BID   zinc sulfate  220 mg Oral Daily   Continuous Infusions:  sodium chloride Stopped (11/30/22 2354)   ceFEPime (MAXIPIME) IV     linezolid (ZYVOX)  IV 600 mg (12/02/22 1007)   metronidazole     PRN Meds:.sodium chloride, acetaminophen **OR** acetaminophen, acetaminophen, fentaNYL (SUBLIMAZE) injection, hydrALAZINE, labetalol, melatonin, naLOXone (NARCAN)  injection, nitroGLYCERIN, ondansetron (ZOFRAN) IV, ondansetron (ZOFRAN) IV, oxyCODONE, oxyCODONE-acetaminophen, sodium chloride flush   OBJECTIVE: Blood pressure (!) 146/76, pulse 83, temperature 98.4 F (36.9 C), temperature source Oral, resp. rate 15, height $RemoveBe'5\' 9"'yNMSNIAcP$  (1.753 m), weight 128.7 kg, SpO2 99 %.  Physical Exam  General/constitutional: no distress, pleasant HEENT: Normocephalic, PER, Conj Clear, EOMI, Oropharynx clear Neck supple CV: rrr no mrg Lungs: clear to auscultation, normal respiratory effort Abd: Soft, Nontender Ext: no edema Skin: No Rash Neuro: nonfocal MSK: left foot dressing c/d     Lab Results Lab Results  Component Value Date   WBC 7.4 12/01/2022   HGB 10.1 (L) 12/01/2022   HCT 31.6 (L) 12/01/2022   MCV 88.3 12/01/2022   PLT 217 12/01/2022    Lab Results  Component Value Date   CREATININE 1.82 (H) 12/01/2022   BUN 31 (H) 12/01/2022   NA 137 12/01/2022   K 4.1 12/01/2022   CL 108 12/01/2022   CO2 23 12/01/2022    Lab Results  Component Value Date   ALT 16 11/26/2022   AST 21 11/26/2022   ALKPHOS 130 (H) 11/26/2022   BILITOT 0.5 11/26/2022      Microbiology: Recent Results (from the past 240 hour(s))  Surgical pcr  screen     Status: None   Collection Time: 11/30/22  7:40 AM   Specimen: Nasal Mucosa; Nasal Swab  Result Value Ref Range Status   MRSA, PCR NEGATIVE NEGATIVE Final   Staphylococcus aureus NEGATIVE NEGATIVE Final    Comment: (NOTE) The Xpert SA Assay (FDA approved for NASAL specimens in patients 69 years of age and older), is one component of a comprehensive surveillance program. It is not intended to diagnose infection nor to guide or monitor treatment. Performed at [facility], [place] [address] [place], [place] [address]   Aerobic/Anaerobic Culture w Gram Stain (surgical/deep wound)     Status: None (Preliminary result)   Collection Time: 11/30/22  4:43 PM   Specimen: PATH Other; Tissue  Result Value Ref Range Status   Specimen Description BONE  Final   Special Requests 5TH METATARSAL  Final   Gram Stain   Final    NO WBC SEEN NO ORGANISMS SEEN Performed at [facility], [address], [place], [place] [address]    Culture   Final    RARE PSEUDOMONAS AERUGINOSA CRITICAL RESULT CALLED TO, READ BACK BY AND VERIFIED WITH: RN DENISE M.  [id number] FCP NO ANAEROBES ISOLATED; CULTURE IN PROGRESS FOR 5 DAYS    Report Status PENDING  Incomplete  Aerobic/Anaerobic Culture w Gram Stain (surgical/deep wound)     Status: None (Preliminary result)   Collection Time: 11/30/22  4:45 PM   Specimen: PATH Other; Tissue  Result Value Ref Range Status   Specimen Description BONE  Final   Special Requests CUBOID  Final   Gram Stain NO WBC SEEN NO ORGANISMS SEEN   Final   Culture   Final    NO GROWTH 2 DAYS NO ANAEROBES ISOLATED; CULTURE IN PROGRESS FOR 5 DAYS Performed at [facility], [address] [place], [place] [address]    Report Status PENDING  Incomplete     Serology:    Imaging: If present, new imagings (plain films, ct scans, and mri) have been personally visualized and  interpreted; radiology reports have been reviewed. Decision making incorporated  into the Impression / Recommendations.  12/18 xray foot left  Lucency and fragmentation of the base of the fifth metatarsal compatible with patient's known osteomyelitis. Alignment appears stable.  Jabier Mutton, [facility] [place] [phone] pager    12/02/2022, 3:38 PM

## 2022-12-02 NOTE — Progress Notes (Addendum)
[facility] PROGRESS NOTE  Joshua [name] (DOB: 18-Jul-1956) POL:[id number] PCP: Joshua Dus, MD  Brief Narrative: Joshua [name] is a 66 y.o. male with a history of poorly healing wound of the left lateral foot, diabetes type 2 complicated by diabetic poorly neuropathy, chronic diastolic heart failure, CKD stage IIIb, creatinine baseline 2--2.4, hypertension, hyperlipidemia who presents with nonhealing wound of the left foot after refer for admission from podiatry clinic. Podiatry recommended BKA though the patient declined, opting for attempted limb salvage. Vascular consulted for angiogram 12/18, found a large dominant left posterior tibial artery and occluded anterior tibial artery. Partial 5th MT amputation and biopsy performed with wound vac placement on 12/19. Broad IV antibiotics have continued with ID consult pending and cultures without growth thus far.  Subjective: Pain controlled, concerned about trying to maintain this wound care and antibiotics if discharged to the street. No chest pain or other new complaints.   Objective: BP (!) 146/76   Pulse 83   Temp 98.4 F (36.9 C) (Oral)   Resp 15   Ht 5\' 9"  (1.753 m)   Wt 128.7 kg   SpO2 99%   BMI 41.90 kg/m   Gen: No distress, visiting with son in the room Pulm: Clear, nonlabored  CV: RRR, no MRG or RLE pitting edema GI: Soft, NT, +BS Neuro: Alert and oriented. No new focal deficits. Ext: Warm, dry. Right 5th digit amputated proximal to PIP, right TMA healed, left LE with stable c/d/I ACE dressing and wound vac with serosanguinous output. Sensation and motor function intact distally, intact cap refill. Skin: No other/new rashes, lesions or ulcers on visualized skin   Assessment & Plan: Nonhealing diabetic foot ulcer with osteomyelitis: s/p debridement 11/25 and oral antibiotics, worsened. BKA was recommended, as attempt at limb salvage, now s/p partial resection of left 5th MT with skin substitute and wound vac  placement on 12/19. - Continue broad IV antibiotics pending culture data, including cuboid Bx.  - Negative gram stain from 5th MT and cuboid, No growth thus far. Note Proteus (amp and bactrim resistant) growth from left foot bone 11/26. - Consult ID, appreciate their recommendations. - Podiatry following, continue wound vac. Anticipated duration typically 4-12 weeks. Will be exchanged by podiatry 12/22-12/23. - NWB LLE  PAD s/p right TMA 2017: Arteriogram 12/18 with occluded left anterior tibial artery without reconstitution, dominant large posterior tibial artery noted.  - Antiplatelet, statin  IDT2DM with diabetic neuropathy: HbA1c 7.6%.  - Continue glargine 30u qAM, 10u qPM (can likely combine these doses), increase mealtime 5u > 7u TIDWC novolog + resistant SSI. Will augment SSI to resistant scale. - Continue gabapentin.   Poor SDOH profile, housing insecurity: Without addressing current homelessness, do not suspect any efforts at limb salvage will be fruitful.   - CSW consulted. Will pursue SNF placement at discharge.  Stage IIIb CKD: SCr stable, With his weight, CrCl actually appears to be more within IIIa range. - Avoid nephrotoxins. Had CO2 arteriogram.   CAD s/p multiple PCI, HTN, HLD: Demand myocardial ischemia noted this admission.  - Continue ASA, ok to restart brilinta after my d/w podiatry today  - Continue atorvastatin 80mg . LDL is 33, HDL is 43.  - Continue metoprolol, imdur.   OSA:  - CPAP qHS encouraged  AOCD: Monitor CBC intermittently.  Gout: Quiescent.  - Restart home allopurinol  Morbid obesity: Body mass index is 41.9 kg/m.   Joshua Pour, MD [facility] [email or web address] 12/02/2022, 2:04 PM

## 2022-12-02 NOTE — Evaluation (Signed)
Occupational Therapy Evaluation Patient Details Name: Joshua [name] MRN: [id number] DOB: 1956/09/14 Today's Date: 12/02/2022   History of Present Illness 66 y.o. male  who is admitted to [facility] on 11/25/2022 with nonhealing wound of the left foot after presenting from podiatry clinic to [facility] ED complaining of such. Found to have poor circulation and osteomyelitis S/p 12/18 ultrasound guided cannulation of R common femoral artery and 12/19 partial resection of L 5th metatarsal  and skin graft PMH: poorly healing wound to the left lateral foot, type 2 diabetes mellitus complicated by diabetic peripheral polyneuropathy, chronic diastolic heart failure, CKD 3B with baseline creatinine range 2-2.4, essential hypertension, hyperlipidemia,   Clinical Impression   Pt was living in his car and walking with a cane prior to admission. Pt did not have access to a sanitary bathroom for regular bathing and dressing or space within his care for regular care and observation of his feet. Pt presents with generalized weakness, back pain and impaired standing balance. Pt requires max assist to stand, mod assist to transfer with difficulty maintaining NWB on L LE. He needs set up to mod assist for ADLs. Began educating pt in compensatory strategies for ADLs leaning side to side for LB ADLs and pericare. Pt receptive to all instruction. Recommend ST rehab in SNF. Long conversation with pt about long term housing plan as rehab is short term.      Recommendations for follow up therapy are one component of a multi-disciplinary discharge planning process, led by the attending physician.  Recommendations may be updated based on patient status, additional functional criteria and insurance authorization.   Follow Up Recommendations  Skilled nursing-short term rehab (<3 hours/day)     Assistance Recommended at Discharge Frequent or constant Supervision/Assistance  Patient can return home with the following A  lot of help with walking and/or transfers;A lot of help with bathing/dressing/bathroom;Assist for transportation;Help with stairs or ramp for entrance;Assistance with cooking/housework    Functional Status Assessment  Patient has had a recent decline in their functional status and demonstrates the ability to make significant improvements in function in a reasonable and predictable amount of time.  Equipment Recommendations  Wheelchair (measurements OT);Wheelchair cushion (measurements OT)    Recommendations for Other Services       Precautions / Restrictions Precautions Precautions: Fall Precaution Comments: wound vac L foot Restrictions Weight Bearing Restrictions: Yes LLE Weight Bearing: Non weight bearing      Mobility Bed Mobility Overal bed mobility: Modified Independent                  Transfers Overall transfer level: Needs assistance Equipment used: Rolling walker (2 wheels) Transfers: Sit to/from Stand Sit to Stand: Max assist, From elevated surface           General transfer comment: difficulty maintaining NWB on L LE, pt with neuropathy, transmet amputation and weakness in R LE      Balance Overall balance assessment: Needs assistance   Sitting balance-Leahy Scale: Normal     Standing balance support: Bilateral upper extremity supported, Reliant on assistive device for balance Standing balance-Leahy Scale: Poor                             ADL either performed or assessed with clinical judgement   ADL Overall ADL's : Needs assistance/impaired Eating/Feeding: Independent   Grooming: Set up;Sitting   Upper Body Bathing: Set up;Sitting   Lower Body Bathing: Moderate  assistance;Sitting/lateral leans   Upper Body Dressing : Set up;Sitting   Lower Body Dressing: Moderate assistance;Sitting/lateral leans   Toilet Transfer: Moderate assistance;Stand-pivot;Rolling walker (2 wheels);BSC/3in1   Toileting- Clothing Manipulation and  Hygiene: Modified independent;Sitting/lateral lean         General ADL Comments: Pt educated in ADLs seated, leaning side to side.     Vision Ability to See in Adequate Light: 0 Adequate Patient Visual Report: No change from baseline       Perception     Praxis      Pertinent Vitals/Pain Pain Assessment Pain Assessment: Faces Faces Pain Scale: Hurts little more Pain Location: back Pain Descriptors / Indicators: Discomfort Pain Intervention(s): Monitored during session, Repositioned     Hand Dominance Right   Extremity/Trunk Assessment Upper Extremity Assessment Upper Extremity Assessment: Overall WFL for tasks assessed (amputated L 4th finger)   Lower Extremity Assessment Lower Extremity Assessment: Defer to PT evaluation   Cervical / Trunk Assessment Cervical / Trunk Assessment: Other exceptions (obese)   Communication Communication Communication: No difficulties   Cognition Arousal/Alertness: Awake/alert Behavior During Therapy: WFL for tasks assessed/performed Overall Cognitive Status: Within Functional Limits for tasks assessed                                       General Comments       Exercises     Shoulder Instructions      Home Living Family/patient expects to be discharged to:: Shelter/Homeless                                 Additional Comments: Pt living in his car behind [facility].      Prior Functioning/Environment Prior Level of Function : Independent/Modified Independent             Mobility Comments: using SPC for ambulation in last month ADLs Comments: pt with difficulty with ADLs due to living in his car        OT Problem List: Decreased strength;Impaired balance (sitting and/or standing);Decreased knowledge of use of DME or AE;Pain;Obesity      OT Treatment/Interventions: Self-care/ADL training;DME and/or AE instruction;Therapeutic activities;Patient/family education;Balance training    OT  Goals(Current goals can be found in the care plan section) Acute Rehab OT Goals OT Goal Formulation: With patient Time For Goal Achievement: 12/16/22 Potential to Achieve Goals: Good ADL Goals Pt Will Perform Lower Body Bathing: with modified independence;sitting/lateral leans Pt Will Perform Lower Body Dressing: with modified independence;sitting/lateral leans Pt Will Transfer to Toilet: with modified independence;with transfer board;bedside commode Pt Will Perform Toileting - Clothing Manipulation and hygiene: with modified independence;sitting/lateral leans  OT Frequency: Min 2X/week    Co-evaluation              AM-PAC OT "6 Clicks" Daily Activity     Outcome Measure Help from another person eating meals?: None Help from another person taking care of personal grooming?: A Little Help from another person toileting, which includes using toliet, bedpan, or urinal?: A Little Help from another person bathing (including washing, rinsing, drying)?: A Little Help from another person to put on and taking off regular upper body clothing?: None Help from another person to put on and taking off regular lower body clothing?: A Little 6 Click Score: 20   End of Session Equipment Utilized During Treatment: Rolling walker (2 wheels);Gait  belt  Activity Tolerance: Patient tolerated treatment well Patient left: in bed;with call bell/phone within reach  OT Visit Diagnosis: Unsteadiness on feet (R26.81);Muscle weakness (generalized) (M62.81);Pain                Time: 0415-9301 OT Time Calculation (min): 52 min Charges:  OT General Charges $OT Visit: 1 Visit OT Evaluation $OT Eval Moderate Complexity: 1 Mod OT Treatments $Self Care/Home Management : 23-37 mins  Cleta Alberts, OTR/L Acute Rehabilitation Services Office: [phone]   Malka So 12/02/2022, 3:39 PM

## 2022-12-02 NOTE — Plan of Care (Signed)

## 2022-12-02 NOTE — Progress Notes (Signed)
  Subjective:  Patient ID: Joshua [name], male    DOB: 1956-10-11,  MRN: [id number]  A 66 y.o. male presents with left foot osteomyeltis s/p left foot partial resection of the fifth metatarsal with open biopsy of the cuboid with application of skin substitute and negative pressure wound therapy postop day 2.  Denies any fevers or chills.  Overall doing well.  No significant pain. Objective:   Vitals:   12/02/22 0751 12/02/22 1004  BP: (!) 145/78 (!) 146/76  Pulse: 83   Resp: 18 15  Temp: 98.4 F (36.9 C)   SpO2:       General AA&O x3. Normal mood and affect.  Vascular Splint intact  Neurologic Epicritic sensation grossly intact.  Dermatologic Bandages clean dry intact.  No signs of strikethrough no calf pain noted motor or sensory functions are intact  Orthopedic: Range of motion intact of the toes.  No pain with calf compression.  Wound VAC intact and functioning properly.  Bloody drainage in the canister.  No pus.    Assessment & Plan:  Patient was evaluated and treated and all questions answered.  Status post left foot osteomyelitis status post left foot partial resection fifth metatarsal base with biopsy of the cuboid and application of negative pressure wound therapy with skin substitute  -Appreciate infectious disease consultation.  Awaiting culture results.  Continue antibiotics. -Nonweightbearing to the left lower extremity -He will need dressing changes likely Friday versus Saturday. -He will need to be set up for wound VAC as well. -Podiatry to continue to follow   Celesta Gentile, DPM  Accessible via secure chat for questions or concerns.

## 2022-12-02 NOTE — Progress Notes (Addendum)
Nutrition Follow-up  DOCUMENTATION CODES:  Obesity unspecified  INTERVENTION:  Continue current diet as ordered Encourage PO intake 1 packet Juven BID, each packet provides 95 calories, 2.5 grams of protein (collagen), + micronutrients for wound healing Ensure Max po BID, each supplement provides 150 kcal and 30 grams of protein MVI with minerals daily 250 mg vitamin C BID x 14 days 220 mg zinc sulfate daily x 14 days Magic cup 1x/d with dinner, each supplement provides 290 kcal and 9 grams of protein   NUTRITION DIAGNOSIS:  Increased nutrient needs related to wound healing as evidenced by estimated needs. - remains applicable  GOAL:  Patient will meet greater than or equal to 90% of their needs - progressing  MONITOR:  PO intake, Supplement acceptance  REASON FOR ASSESSMENT:   Consult Assessment of nutrition requirement/status, Wound healing  ASSESSMENT:  Pt with hx of HLD, HTN, GERD, CHF, DM type 2, COPD, hx MI, CKD3, and chronic wounds presented to ED from outpatient appointment for worsening wounds to the left foot.  Pt resting in bed at the time of assessment. States that he is feeling well today, appetite has been good since surgery. Checked in about interventions that are currently in place. Pt reports that he does like the Ensure Max and [employer]. Pt's glucose has been elevated, so will adjust the frequency of supplements to provide more protein and less carbohydrates.   Average Meal Intake: 12/17-12/21: 50% intake x 2 recorded meals  Nutritionally Relevant Medications: Scheduled Meds:  ascorbic acid  500 mg Oral BID   atorvastatin  80 mg Oral Daily   insulin aspart  0-20 Units Subcutaneous TID WC   insulin aspart  0-5 Units Subcutaneous QHS   insulin aspart  5 Units Subcutaneous TID WC   insulin glargine-yfgn  10 Units Subcutaneous QHS   insulin glargine-yfgn  30 Units Subcutaneous Daily   multivitamin with minerals  1 tablet Oral Daily   JUVEN  1 packet  Oral BID BM   Ensure Max Protein  11 oz Oral QHS   zinc sulfate  220 mg Oral Daily   Continuous Infusions:  linezolid (ZYVOX) IV 600 mg (12/02/22 1007)   PRN Meds: ondansetron  Labs Reviewed: BUN 31, creatinine 1.82 CBG ranges from 119-220 mg/dL over the last 24 hours  Micronutrient Labs: Vitamin A 28.4 Vitamin C 0.7   NUTRITION - FOCUSED PHYSICAL EXAM: Flowsheet Row Most Recent Value  Orbital Region No depletion  Upper Arm Region No depletion  Thoracic and Lumbar Region No depletion  Buccal Region No depletion  Temple Region No depletion  Clavicle Bone Region No depletion  Clavicle and Acromion Bone Region No depletion  Scapular Bone Region No depletion  Dorsal Hand Severe depletion  Patellar Region No depletion  Anterior Thigh Region No depletion  Posterior Calf Region No depletion  Edema (RD Assessment) Mild  [BLE]  Hair Reviewed  Eyes Reviewed  Mouth Reviewed  Skin Reviewed  Nails Reviewed    Diet Order:   Diet Order             Diet Carb Modified Fluid consistency: Thin; Room service appropriate? Yes  Diet effective now                   EDUCATION NEEDS:  No education needs have been identified at this time  Skin:  Skin Assessment: Skin Integrity Issues: Skin Integrity Issues:: Wound VAC Wound Vac: surgical incision to the left foot  Last BM:  12/21  Height:  Ht Readings from Last 1 Encounters:  11/30/22 $RemoveB'5\' 9"'xOqApfCi$  (1.753 m)   Weight:  Wt Readings from Last 1 Encounters:  12/02/22 128.7 kg    Ideal Body Weight:  72.7 kg  BMI:  Body mass index is 41.9 kg/m.  Estimated Nutritional Needs:  Kcal:  2150-2350 Protein:  110-135g/d Fluid:  > 2 L   Ranell Patrick, RD, LDN Clinical Dietitian RD pager # available in AMION  After hours/weekend pager # available in [facility]

## 2022-12-03 ENCOUNTER — Inpatient Hospital Stay: Payer: Self-pay

## 2022-12-03 DIAGNOSIS — L97509 Non-pressure chronic ulcer of other part of unspecified foot with unspecified severity: Secondary | ICD-10-CM | POA: Diagnosis not present

## 2022-12-03 DIAGNOSIS — S81802A Unspecified open wound, left lower leg, initial encounter: Secondary | ICD-10-CM | POA: Diagnosis not present

## 2022-12-03 DIAGNOSIS — M8618 Other acute osteomyelitis, other site: Secondary | ICD-10-CM | POA: Diagnosis not present

## 2022-12-03 DIAGNOSIS — E1169 Type 2 diabetes mellitus with other specified complication: Secondary | ICD-10-CM | POA: Diagnosis not present

## 2022-12-03 DIAGNOSIS — E11621 Type 2 diabetes mellitus with foot ulcer: Secondary | ICD-10-CM | POA: Diagnosis not present

## 2022-12-03 LAB — GLUCOSE, CAPILLARY
Glucose-Capillary: 130 mg/dL — ABNORMAL HIGH (ref 70–99)
Glucose-Capillary: 147 mg/dL — ABNORMAL HIGH (ref 70–99)
Glucose-Capillary: 121 mg/dL — ABNORMAL HIGH (ref 70–99)
Glucose-Capillary: 119 mg/dL — ABNORMAL HIGH (ref 70–99)

## 2022-12-03 MED ORDER — FUROSEMIDE 40 MG PO TABS
40.0000 mg | ORAL_TABLET | Freq: Two times a day (BID) | ORAL | Status: DC
  Administered 2022-12-03 – 2022-12-09 (×13): 40 mg via ORAL
  Filled 2022-12-03 (×13): qty 1

## 2022-12-03 MED ORDER — CHLORHEXIDINE GLUCONATE CLOTH 2 % EX PADS
6.0000 | MEDICATED_PAD | Freq: Every day | CUTANEOUS | Status: DC
  Administered 2022-12-03 – 2022-12-09 (×7): 6 via TOPICAL

## 2022-12-03 MED ORDER — ORAL CARE MOUTH RINSE: 15.0000 mL | OROMUCOSAL | Status: DC | PRN

## 2022-12-03 NOTE — Progress Notes (Signed)
[place] for Infectious Disease  Date of Admission:  11/25/2022     Total days of antibiotics 7         ASSESSMENT:  Mr. Catena Pseudomonas susceptibilities remain pending. Narrow antibiotics to cefepime and will continue to monitor cultures to determine any changes if necessary. Continue with wound care per Dr. Jacqualyn Posey. Will place PICC line and continue with plan of care for 6 weeks of treatment. OPAT order placed. Disposition remains pending. Will schedule follow up in the ID clinic.   PLAN:  Change antibiotic to cefepime.  Monitor culture for sensitivities and adjust antibiotics as needed.  Place PICC line. Wound care per Dr. Jacqualyn Posey OPAT orders Follow up scheduled in ID clinic.  ID will sign off. Please re-consult if needed.    Diagnosis:  Osteomyelitis   Culture Result: Pseudomonas  Allergies  Allergen Reactions   Chlorthalidone Other (See Comments)    Causes dehydration, kidneys shut down    OPAT Orders Discharge antibiotics to be given via PICC line Discharge antibiotics: Cefepime Per pharmacy protocol  Duration: 6 weeks  End Date: 01/13/23  [facility] Care Per Protocol:  Home health RN for IV administration and teaching; PICC line care and labs.    Labs weekly while on IV antibiotics: _X_ CBC with differential _X_ BMP __ CMP _X_ CRP _X_ ESR __ Vancomycin trough __ CK  _X_ Please pull PIC at completion of IV antibiotics __ Please leave PIC in place until doctor has seen patient or been notified  Fax weekly labs to [phone]  Clinic Follow Up Appt: Dr. Gale Journey on 12/15/22   Principal Problem:   Non-healing wound of left lower extremity Active Problems:   Hyperlipidemia   Acute osteomyelitis of hindfoot, left (HCC)   Chronic diastolic CHF (congestive heart failure) (HCC)   Stage 3b chronic kidney disease (CKD) (HCC)   DM2 (diabetes mellitus, type 2) ([place])   Essential hypertension   Preop cardiovascular exam   Cardiac chest pain    Acute osteomyelitis of metatarsal bone of left foot (HCC)   Diabetic foot ulcer with osteomyelitis (HCC)    allopurinol  100 mg Oral Daily   ascorbic acid  250 mg Oral BID   aspirin EC  81 mg Oral Daily   atorvastatin  80 mg Oral Daily   enoxaparin (LOVENOX) injection  60 mg Subcutaneous Q24H   furosemide  40 mg Oral BID   gabapentin  100 mg Oral QHS   influenza vac split quadrivalent PF  0.5 mL Intramuscular Tomorrow-1000   insulin aspart  0-20 Units Subcutaneous TID WC   insulin aspart  0-5 Units Subcutaneous QHS   insulin aspart  7 Units Subcutaneous TID WC   insulin glargine-yfgn  10 Units Subcutaneous QHS   insulin glargine-yfgn  30 Units Subcutaneous Daily   isosorbide mononitrate  180 mg Oral Daily   metoprolol succinate  75 mg Oral Daily   multivitamin with minerals  1 tablet Oral Daily   nutrition supplement (JUVEN)  1 packet Oral BID BM   Ensure Max Protein  11 oz Oral BID   sodium chloride flush  3 mL Intravenous Q12H   ticagrelor  90 mg Oral BID   zinc sulfate  220 mg Oral Daily    SUBJECTIVE:  Afebrile overnight with no acute events.   Allergies  Allergen Reactions   Chlorthalidone Other (See Comments)    Causes dehydration, kidneys shut down     Review of Systems: Review of Systems  Constitutional:  Negative for chills, fever and weight loss.  Respiratory:  Negative for cough, shortness of breath and wheezing.   Cardiovascular:  Negative for chest pain and leg swelling.  Gastrointestinal:  Negative for abdominal pain, constipation, diarrhea, nausea and vomiting.  Skin:  Negative for rash.      OBJECTIVE: Vitals:   12/02/22 0751 12/02/22 1004 12/02/22 2148 12/03/22 0556  BP: (!) 145/78 (!) 146/76 (!) 142/79 132/72  Pulse: 83  80 77  Resp: _0 Temp: 98.4 F (36.9 C)  98.5 F (36.9 C) 98 F (36.7 C)  TempSrc: Oral  Oral Oral  SpO2:   100% 96%  Weight:    132.6 kg  Height:       Body mass index is 43.17 kg/m.  Physical  Exam Constitutional:      General: He is not in acute distress.    Appearance: He is well-developed.  Cardiovascular:     Rate and Rhythm: Normal rate and regular rhythm.     Heart sounds: Normal heart sounds.  Pulmonary:     Effort: Pulmonary effort is normal.     Breath sounds: Normal breath sounds.  Skin:    General: Skin is warm and dry.  Neurological:     Mental Status: He is alert.     Lab Results Lab Results  Component Value Date   WBC 7.4 12/01/2022   HGB 10.1 (L) 12/01/2022   HCT 31.6 (L) 12/01/2022   MCV 88.3 12/01/2022   PLT 217 12/01/2022    Lab Results  Component Value Date   CREATININE 1.82 (H) 12/01/2022   BUN 31 (H) 12/01/2022   NA 137 12/01/2022   K 4.1 12/01/2022   CL 108 12/01/2022   CO2 23 12/01/2022    Lab Results  Component Value Date   ALT 16 11/26/2022   AST 21 11/26/2022   ALKPHOS 130 (H) 11/26/2022   BILITOT 0.5 11/26/2022     Microbiology: Recent Results (from the past 240 hour(s))  Surgical pcr screen     Status: None   Collection Time: 11/30/22  7:40 AM   Specimen: Nasal Mucosa; Nasal Swab  Result Value Ref Range Status   MRSA, PCR NEGATIVE NEGATIVE Final   Staphylococcus aureus NEGATIVE NEGATIVE Final    Comment: (NOTE) The Xpert SA Assay (FDA approved for NASAL specimens in patients 10 years of age and older), is one component of a comprehensive surveillance program. It is not intended to diagnose infection nor to guide or monitor treatment. Performed at [facility], [place] [address], [place], [place] [address]   Aerobic/Anaerobic Culture w Gram Stain (surgical/deep wound)     Status: None (Preliminary result)   Collection Time: 11/30/22  4:43 PM   Specimen: PATH Other; Tissue  Result Value Ref Range Status   Specimen Description BONE  Final   Special Requests 5TH METATARSAL  Final   Gram Stain NO WBC SEEN NO ORGANISMS SEEN   Final   Culture   Final    RARE PSEUDOMONAS AERUGINOSA CRITICAL RESULT CALLED TO, READ  BACK BY AND VERIFIED WITH: RN [place]  [id number] FCP NO ANAEROBES ISOLATED; CULTURE IN PROGRESS FOR 5 DAYS SUSCEPTIBILITIES TO FOLLOW Performed at [facility], [place] [address], [place], [place] [address]    Report Status PENDING  Incomplete  Aerobic/Anaerobic Culture w Gram Stain (surgical/deep wound)     Status: None (Preliminary result)   Collection Time: 11/30/22  4:45 PM   Specimen:  PATH Other; Tissue  Result Value Ref Range Status   Specimen Description BONE  Final   Special Requests CUBOID  Final   Gram Stain NO WBC SEEN NO ORGANISMS SEEN   Final   Culture   Final    NO GROWTH 3 DAYS NO ANAEROBES ISOLATED; CULTURE IN PROGRESS FOR 5 DAYS Performed at [facility], [address] [place], [place] [address]    Report Status PENDING  Incomplete     Terri Piedra, [place] for Infectious [facility]  12/03/2022  1:24 PM

## 2022-12-03 NOTE — Plan of Care (Signed)

## 2022-12-03 NOTE — Progress Notes (Signed)
PHARMACY CONSULT NOTE FOR:  OUTPATIENT  PARENTERAL ANTIBIOTIC THERAPY (OPAT)  NOTE: This OPAT is informational only as patient will be discharging to SNF.   Indication: Pseudomonas aeruginosa osteomyelitis  Regimen: Cefepime 2g IV Q12H  End date: 01/13/23 (6w from 12/02/22)  IV antibiotic discharge orders are pended. To discharging provider:  please sign these orders via discharge navigator,  Select New Orders & click on the button choice - Manage This Unsigned Work.     Thank you for allowing pharmacy to be a part of this patient's care.  Adria Dill, PharmD PGY-2 Infectious Diseases Resident  12/03/2022 11:01 AM

## 2022-12-03 NOTE — Progress Notes (Signed)
Mobility Specialist - Progress Note   12/03/22 1513  Mobility  Activity Transferred from bed to chair  Level of Assistance +2 (takes two people)  Assistive Device Front wheel walker  LLE Weight Bearing NWB  Activity Response Tolerated well  Mobility Referral Yes  $Mobility charge 1 Mobility   Pt received in chair requesting assistance back to bed. Pt was ModA to stand from chair, +2 for safety. Pt was left in bed with all needs met and RN present.  Franki Monte  Mobility Specialist Please contact via Solicitor or Rehab office at [phone]

## 2022-12-03 NOTE — Progress Notes (Signed)
Physical Therapy Treatment Patient Details Name: Joshua [name] MRN: [id number] DOB: 04-23-1956 Today's Date: 12/03/2022   History of Present Illness 66 y.o. male  who is admitted to [facility] on 11/25/2022 with nonhealing wound of the left foot after presenting from podiatry clinic to [facility] ED complaining of such. Found to have poor circulation and osteomyelitis S/p 12/18 ultrasound guided cannulation of R common femoral artery and 12/19 partial resection of L 5th metatarsal  and skin graft PMH: poorly healing wound to the left lateral foot, type 2 diabetes mellitus complicated by diabetic peripheral polyneuropathy, chronic diastolic heart failure, CKD 3B with baseline creatinine range 2-2.4, essential hypertension, hyperlipidemia,    PT Comments    Pt admitted with above diagnosis. Pt progressing with ambulation with RW continuing to need +2 min to mod assist for safety due to NWB left LE. Pt fatigues quickly and has poor endurance.  Pt states he had been struggling at times with balance PTA. Will continue to follow acutely and pt needs SNF for rehab prior to d/c home.  Pt currently with functional limitations due to the deficits listed below (see PT Problem List). Pt will benefit from skilled PT to increase their independence and safety with mobility to allow discharge to the venue listed below.      Recommendations for follow up therapy are one component of a multi-disciplinary discharge planning process, led by the attending physician.  Recommendations may be updated based on patient status, additional functional criteria and insurance authorization.  Follow Up Recommendations  Skilled nursing-short term rehab (<3 hours/day) Can patient physically be transported by private vehicle: Yes   Assistance Recommended at Discharge Intermittent Supervision/Assistance  Patient can return home with the following Other (comment) (assist for wound care)   Equipment Recommendations  Wheelchair  (measurements PT);Wheelchair cushion (measurements PT)    Recommendations for Other Services OT consult     Precautions / Restrictions Precautions Precautions: Fall Precaution Comments: wound vac L foot Required Braces or Orthoses: Other Brace Splint/Cast - Date Prophylactic Dressing Applied (if applicable): 71/06/26 Other Brace: R post op shoe Restrictions Weight Bearing Restrictions: Yes LLE Weight Bearing: Non weight bearing     Mobility  Bed Mobility Overal bed mobility: Modified Independent             General bed mobility comments: HoB elevated and use of bed rail to pull to EoB, min A for management of lines    Transfers Overall transfer level: Needs assistance Equipment used: Rolling walker (2 wheels) Transfers: Sit to/from Stand Sit to Stand: From elevated surface, Mod assist, Min assist, +2 safety/equipment   Step pivot transfers: Mod assist, Min assist, +2 safety/equipment       General transfer comment: Initially pt needed mod assist to stand but progressing to min with more practice. Hand placement one hand on chair and one on RW. Pt did better maintaining NWB on L LE with verbal cues only. Pt took pivotal hops to right to chair with initial mod assist progressing to min assist.    Ambulation/Gait Ambulation/Gait assistance: Min assist, +2 safety/equipment Gait Distance (Feet): 5 Feet Assistive device: Rolling walker (2 wheels) Gait Pattern/deviations: Decreased stance time - right, Decreased weight shift to left, Knee flexed in stance - left, Step-to pattern Gait velocity: decreased Gait velocity interpretation: <1.31 ft/sec, indicative of household ambulator   General Gait Details: Pt able to take 5 steps forward and chair brought to pt once he walked this distance. Pt needed verbal cues for NWB  left LE with min assist at times for balance. Pt gets fatigued and gets more shaky.   Stairs             Wheelchair Mobility    Modified Rankin  (Stroke Patients Only)       Balance Overall balance assessment: Needs assistance Sitting-balance support: Feet supported, No upper extremity supported Sitting balance-Leahy Scale: Normal Sitting balance - Comments: able to don cast shoe on R foot   Standing balance support: Bilateral upper extremity supported, Reliant on assistive device for balance Standing balance-Leahy Scale: Poor Standing balance comment: requires increased assist to maintain L LE NWB and for balance wityh RW use                            Cognition Arousal/Alertness: Awake/alert Behavior During Therapy: WFL for tasks assessed/performed Overall Cognitive Status: Within Functional Limits for tasks assessed                                          Exercises General Exercises - Lower Extremity Ankle Circles/Pumps: AROM, Both, 10 reps, Supine Long Arc Quad: AROM, Both, 10 reps, Seated Heel Slides: AROM, Both, 10 reps, Supine Hip Flexion/Marching: AROM, Both, 10 reps, Seated    General Comments General comments (skin integrity, edema, etc.): VSS      Pertinent Vitals/Pain Pain Assessment Pain Assessment: Faces Faces Pain Scale: Hurts little more Pain Location: back Pain Descriptors / Indicators: Discomfort Pain Intervention(s): Limited activity within patient's tolerance, Monitored during session, Repositioned    Home Living                          Prior Function            PT Goals (current goals can now be found in the care plan section) Acute Rehab PT Goals Patient Stated Goal: keep L LE Progress towards PT goals: Progressing toward goals    Frequency    Min 3X/week      PT Plan Current plan remains appropriate    Co-evaluation              AM-PAC PT "6 Clicks" Mobility   Outcome Measure  Help needed turning from your back to your side while in a flat bed without using bedrails?: None Help needed moving from lying on your back to  sitting on the side of a flat bed without using bedrails?: A Little Help needed moving to and from a bed to a chair (including a wheelchair)?: A Lot Help needed standing up from a chair using your arms (e.g., wheelchair or bedside chair)?: A Lot Help needed to walk in hospital room?: Total Help needed climbing 3-5 steps with a railing? : Total 6 Click Score: 13    End of Session Equipment Utilized During Treatment: Gait belt Activity Tolerance: Patient tolerated treatment well Patient left: in chair;with call bell/phone within reach;with chair alarm set Nurse Communication: Mobility status;Precautions;Weight bearing status PT Visit Diagnosis: Unsteadiness on feet (R26.81);Other abnormalities of gait and mobility (R26.89);Difficulty in walking, not elsewhere classified (R26.2) Pain - Right/Left: Left Pain - part of body: Ankle and joints of foot     Time: 1152-1222 PT Time Calculation (min) (ACUTE ONLY): 30 min  Charges:  $Gait Training: 8-22 mins $Therapeutic Exercise: 8-22 mins  [facility] M,PT Acute Rehab Services [place] 12/03/2022, 3:56 PM

## 2022-12-03 NOTE — Progress Notes (Signed)
Peripherally Inserted Central Catheter Placement  The IV Nurse has discussed with the patient and/or persons authorized to consent for the patient, the purpose of this procedure and the potential benefits and risks involved with this procedure.  The benefits include less needle sticks, lab draws from the catheter, and the patient may be discharged home with the catheter. Risks include, but not limited to, infection, bleeding, blood clot (thrombus formation), and puncture of an artery; nerve damage and irregular heartbeat and possibility to perform a PICC exchange if needed/ordered by physician.  Alternatives to this procedure were also discussed.  Bard Power PICC patient education guide, fact sheet on infection prevention and patient information card has been provided to patient /or left at bedside.    PICC Placement Documentation  PICC Single Lumen 12/03/22 Right Brachial 46 cm 0 cm (Active)  Indication for Insertion or Continuance of Line Home intravenous therapies (PICC only) 12/03/22 2104  Exposed Catheter (cm) 0 cm 12/03/22 2104  Site Assessment Clean, Dry, Intact 12/03/22 2104  Line Status Flushed;Saline locked;Blood return noted 12/03/22 2104  Dressing Type Transparent;Securing device 12/03/22 2104  Dressing Status Antimicrobial disc in place;Clean, Dry, Intact 12/03/22 2104  Safety Lock Not Applicable 48/62/82 4175  Line Care Connections checked and tightened 12/03/22 2104  Line Adjustment (NICU/IV Team Only) No 12/03/22 2104  Dressing Intervention New dressing 12/03/22 2104  Dressing Change Due 12/10/22 12/03/22 2104       Mickel Baas  Bart Ashford 12/03/2022, 9:06 PM

## 2022-12-03 NOTE — Progress Notes (Signed)
[facility] PROGRESS NOTE  GEFFREY MICHAELSEN (DOB: 12-02-1956) BMS:[id number] PCP: [name], MD  Brief Narrative: Joshua [name] is a 66 y.o. male with a history of poorly healing wound of the left lateral foot, diabetes type 2 complicated by diabetic poorly neuropathy, chronic diastolic heart failure, CKD stage IIIb, creatinine baseline 2--2.4, hypertension, hyperlipidemia who presents with nonhealing wound of the left foot after refer for admission from podiatry clinic. Podiatry recommended BKA though the patient declined, opting for attempted limb salvage. Vascular consulted for angiogram 12/18, found a large dominant left posterior tibial artery and occluded anterior tibial artery. Partial 5th MT amputation and biopsy performed with wound vac placement on 12/19. Broad IV antibiotics have continued with ID consult pending and cultures without growth thus far.  Subjective: Feels fine, maybe a bit bloated, hasn't been on his lasix, had large BM 12/21.   Objective: BP 132/72 (BP Location: Right Arm)   Pulse 77   Temp 98 F (36.7 C) (Oral)   Resp 17   Ht 5\' 9"  (1.753 m)   Wt 132.6 kg   SpO2 96%   BMI 43.17 kg/m   Gen: No distress Pulm: Clear, nonlabored  CV: RRR, no MRG, no pitting edema +JVD. GI: Soft, NT, ND, +BS  Neuro: Alert and oriented. No new focal deficits. Ext: Warm, Stable right TMA remotely, left foot ACE wrap c/d/i Skin: No new rashes, lesions or ulcers on visualized skin   Assessment & Plan: Nonhealing diabetic foot ulcer with osteomyelitis: s/p debridement 11/25 and oral antibiotics, worsened. BKA was recommended, as attempt at limb salvage, now s/p partial resection of left 5th MT with skin substitute and wound vac placement on 12/19. - Pseudomonas growing from culture of 5th MT, changed to cefepime. D/w ID, Dr. Gale Journey, recommends PICC line and 6 weeks IV antibiotics, currently planning cefepime, though susceptibilities pending. - Podiatry following, continue  wound vac. Anticipated duration typically 4-12 weeks. Will be exchanged by podiatry 12/22-12/23. - NWB LLE  PAD s/p right TMA 2017: Arteriogram 12/18 with occluded left anterior tibial artery without reconstitution, dominant large posterior tibial artery noted.  - Antiplatelet, statin  IDT2DM with diabetic neuropathy: HbA1c 7.6%.  - Continue glargine 30u qAM, 10u qPM (can likely combine these doses), increase mealtime 5u > 7u TIDWC novolog + resistant SSI. Will augment SSI to resistant scale. - Continue gabapentin.   Poor SDOH profile, housing insecurity: Without addressing current homelessness, do not suspect any efforts at limb salvage will be fruitful.   - CSW consulted. Will pursue SNF placement at discharge.  Stage IIIb CKD: SCr stable, With his weight, CrCl actually appears to be more within IIIa range. - Avoid nephrotoxins. Had CO2 arteriogram.  - Will restart home lasix 40mg  BID (confirmed with patient).  CAD s/p multiple PCI, HTN, HLD: Demand myocardial ischemia noted this admission.  - Continue ASA, ok to restart brilinta after my d/w podiatry today  - Continue atorvastatin 80mg . LDL is 33, HDL is 43.  - Continue metoprolol, imdur.   OSA:  - CPAP qHS encouraged  AOCD: Monitor CBC intermittently.  Gout: Quiescent.  - Restart home allopurinol  Morbid obesity: Body mass index is 43.17 kg/m.   [name], MD [facility] [email or web address] 12/03/2022, 1:17 PM

## 2022-12-04 DIAGNOSIS — M869 Osteomyelitis, unspecified: Secondary | ICD-10-CM

## 2022-12-04 DIAGNOSIS — E1142 Type 2 diabetes mellitus with diabetic polyneuropathy: Secondary | ICD-10-CM | POA: Diagnosis not present

## 2022-12-04 DIAGNOSIS — M86172 Other acute osteomyelitis, left ankle and foot: Secondary | ICD-10-CM | POA: Diagnosis not present

## 2022-12-04 DIAGNOSIS — N1832 Chronic kidney disease, stage 3b: Secondary | ICD-10-CM | POA: Diagnosis not present

## 2022-12-04 DIAGNOSIS — E11621 Type 2 diabetes mellitus with foot ulcer: Secondary | ICD-10-CM | POA: Diagnosis not present

## 2022-12-04 LAB — BASIC METABOLIC PANEL
Anion gap: 4 — ABNORMAL LOW (ref 5–15)
BUN: 22 mg/dL (ref 8–23)
CO2: 24 mmol/L (ref 22–32)
Calcium: 8.7 mg/dL — ABNORMAL LOW (ref 8.9–10.3)
Chloride: 108 mmol/L (ref 98–111)
Creatinine, Ser: 1.37 mg/dL — ABNORMAL HIGH (ref 0.61–1.24)
GFR, Estimated: 58 mL/min — ABNORMAL LOW (ref >60)
Glucose, Bld: 96 mg/dL (ref 70–99)
Potassium: 3.8 mmol/L (ref 3.5–5.1)
Sodium: 136 mmol/L (ref 135–145)

## 2022-12-04 LAB — CBC
HCT: 33.6 % — ABNORMAL LOW (ref 39.0–52.0)
Hemoglobin: 10.6 g/dL — ABNORMAL LOW (ref 13.0–17.0)
MCH: 28.1 pg (ref 26.0–34.0)
MCHC: 31.5 g/dL (ref 30.0–36.0)
MCV: 89.1 fL (ref 80.0–100.0)
Platelets: 187 10*3/uL (ref 150–400)
RBC: 3.77 MIL/uL — ABNORMAL LOW (ref 4.22–5.81)
RDW: 18.5 % — ABNORMAL HIGH (ref 11.5–15.5)
WBC: 5.6 10*3/uL (ref 4.0–10.5)
nRBC: 0 % (ref 0.0–0.2)

## 2022-12-04 LAB — GLUCOSE, CAPILLARY
Glucose-Capillary: 84 mg/dL (ref 70–99)
Glucose-Capillary: 123 mg/dL — ABNORMAL HIGH (ref 70–99)
Glucose-Capillary: 96 mg/dL (ref 70–99)

## 2022-12-04 LAB — ZINC: Zinc: 76 ug/dL (ref 44–115)

## 2022-12-04 MED ORDER — SODIUM CHLORIDE 0.9% FLUSH: 10.0000 mL | INTRAVENOUS | Status: DC | PRN

## 2022-12-04 MED ORDER — "THROMBI-PAD 3""X3"" EX PADS"
1.0000 | MEDICATED_PAD | Freq: Once | CUTANEOUS | Status: DC
  Filled 2022-12-04: qty 1

## 2022-12-04 MED ORDER — SODIUM CHLORIDE 0.9% FLUSH
10.0000 mL | Freq: Two times a day (BID) | INTRAVENOUS | Status: DC
  Administered 2022-12-04 – 2022-12-08 (×7): 10 mL via INTRAVENOUS

## 2022-12-04 NOTE — Progress Notes (Signed)
  Subjective:  Patient ID: NGHIA MCENTEE, male    DOB: 03/31/1956,  MRN: [id number]  A 66 y.o. male presents with left foot osteomyeltis s/p left foot partial resection of the fifth metatarsal with open biopsy of the cuboid with application of skin substitute and negative pressure wound therapy postop day 4.  Denies any fevers or chills.  Overall doing well.  No significant pain. Objective:   Vitals:   12/04/22 0552 12/04/22 0750  BP: 139/73 (!) 144/79  Pulse: 78   Resp: 16   Temp: 98.8 F (37.1 C)   SpO2: 98%      General AA&O x3. Normal mood and affect.  Vascular Splint intact  Neurologic Epicritic sensation grossly intact.  Dermatologic VAC is functioning minimal maceration graft is well adhered and integrating well appears to have some hematoma deep to the graft no signs of purulence or infection  Orthopedic: Range of motion intact of the toes.  No pain with calf compression.  Wound VAC intact and functioning properly.  Bloody drainage in the canister.  No pus.     Assessment & Plan:  Patient was evaluated and treated and all questions answered.  Status post left foot osteomyelitis status post left foot partial resection fifth metatarsal base with biopsy of the cuboid and application of negative pressure wound therapy with skin substitute  -Appreciate infectious disease consultation.  Growing Pseudomonas from bone culture and appropriately covered.  Pathology shows osteomyelitis of both bones -Nonweightbearing to the left lower extremity in PRAFO splint which was ordered and applied by orthopedic technician today -Change back Monday Wednesday Friday with Adaptic and black GranuFoam small sponge -He will need VAC changes through discharge either at home or facility -Podiatry to continue to follow  Lanae Crumbly, DPM 12/04/2022

## 2022-12-04 NOTE — Progress Notes (Signed)
Orthopedic Tech Progress Note Patient Details:  Joshua [name] 10-25-1956 [id number]  Ortho Devices Type of Ortho Device: Prafo boot/shoe Ortho Device/Splint Location: LLE Ortho Device/Splint Interventions: Application, Adjustment, Ordered   Post Interventions Patient Tolerated: Well Instructions Provided: Adjustment of device, Care of device Prafo boot somewhat applied, patients dressing was too bulky for the straps near the toes to connect.  Vernona Rieger 12/04/2022, 12:13 PM

## 2022-12-04 NOTE — Progress Notes (Signed)
[facility] PROGRESS NOTE  Joshua [name] (DOB: [birth year]) DDU:[id number] PCP: [name], MD  Brief Narrative: Joshua [name] is a 66 y.o. male with a history of poorly healing wound of the left lateral foot, diabetes type 2 complicated by diabetic poorly neuropathy, chronic diastolic heart failure, CKD stage IIIb, creatinine baseline 2--2.4, hypertension, hyperlipidemia who presents with nonhealing wound of the left foot after refer for admission from podiatry clinic. Podiatry recommended BKA though the patient declined, opting for attempted limb salvage. Vascular consulted for angiogram 12/18, found a large dominant left posterior tibial artery and occluded anterior tibial artery. Partial 5th MT amputation and biopsy performed with wound vac placement on 12/19. Broad IV antibiotics have continued with ID consult pending and cultures without growth thus far.  Subjective: Had some mildly bothersome burning down both thighs this morning not changed with palpation, position, no trauma, not extending to back or groin or legs. Currently this is resolved. No other complaints.   Objective: BP (!) 144/79 (BP Location: Right Wrist)   Pulse 78   Temp 98.8 F (37.1 C) (Oral)   Resp 16   Ht 5\' 9"  (1.753 m)   Wt 128 kg   SpO2 98%   BMI 41.67 kg/m   Gen: No distress Pulm: Clear, nonlabored  CV: RRR, no pitting edema. JVD improved.  GI: Soft, NT, ND, +BS  Neuro: Alert and oriented. No new focal deficits. Ext: Warm, dry. Wound vac with leak detected, beeping. Thighs normal visibly and on palpation. R femoral groin cath site is without hematoma or bruit or thrill or tenderness.  Skin: No new rashes, lesions or ulcers on visualized skin   Assessment & Plan: Nonhealing diabetic foot ulcer with osteomyelitis: s/p debridement 11/25 and oral antibiotics, worsened. BKA was recommended, as attempt at limb salvage, now s/p partial resection of left 5th MT with skin substitute and wound vac  placement on 12/19. - Pseudomonas growing from culture of 5th MT, changed to cefepime. D/w ID, Dr. Gale Journey, recommends 6 weeks IV antibiotics, currently planning cefepime, though susceptibilities remain pending, through PICC placed 12/22. - Podiatry following, continue wound vac. Anticipated duration typically 4-12 weeks. Podiatry to change out wound vac today  - NWB LLE  PAD s/p right TMA 2017: Arteriogram 12/18 with occluded left anterior tibial artery without reconstitution, dominant large posterior tibial artery noted.  - Antiplatelet, statin  IDT2DM with diabetic neuropathy: HbA1c 7.6%.  - Continue glargine 30u qAM, 10u qPM (can likely combine these doses), mealtime novolog 7u TIDWC + resistant SSI. At inpatient goal. - Continue gabapentin.   Poor SDOH profile, housing insecurity: Without addressing current homelessness, do not suspect any efforts at limb salvage will be fruitful.   - CSW consulted. Will pursue SNF placement at discharge.  Stage IIIb CKD: SCr stable, With his weight, CrCl actually appears to be more within IIIa range. - Avoid nephrotoxins. Had CO2 arteriogram.  - Restarted home lasix 40mg  BID (confirmed with patient). CrCl stable and improving.   CAD s/p multiple PCI, HTN, HLD: Demand myocardial ischemia noted this admission.  - Continue ASA, ok to restart brilinta after my d/w podiatry today  - Continue atorvastatin 80mg . LDL is 33, HDL is 43.  - Continue metoprolol, imdur.   OSA:  - CPAP qHS encouraged  AOCD: Monitor CBC intermittently.  Gout: Quiescent.  - Restart home allopurinol  Morbid obesity: Body mass index is 41.67 kg/m.   [name], MD [facility] [email or web address] 12/04/2022, 10:43 AM

## 2022-12-04 NOTE — Progress Notes (Signed)
Pt right arm PICC with dried bloody drainage. Flushed with 23ml NS. No active bleeding noted at this time. Primary RN to change dressing. IV Team will remain available as needed.

## 2022-12-05 DIAGNOSIS — N1832 Chronic kidney disease, stage 3b: Secondary | ICD-10-CM | POA: Diagnosis not present

## 2022-12-05 DIAGNOSIS — E11621 Type 2 diabetes mellitus with foot ulcer: Secondary | ICD-10-CM | POA: Diagnosis not present

## 2022-12-05 DIAGNOSIS — M86172 Other acute osteomyelitis, left ankle and foot: Secondary | ICD-10-CM | POA: Diagnosis not present

## 2022-12-05 DIAGNOSIS — E1142 Type 2 diabetes mellitus with diabetic polyneuropathy: Secondary | ICD-10-CM | POA: Diagnosis not present

## 2022-12-05 LAB — BASIC METABOLIC PANEL
Anion gap: 7 (ref 5–15)
BUN: 25 mg/dL — ABNORMAL HIGH (ref 8–23)
CO2: 25 mmol/L (ref 22–32)
Calcium: 8.9 mg/dL (ref 8.9–10.3)
Chloride: 106 mmol/L (ref 98–111)
Creatinine, Ser: 1.47 mg/dL — ABNORMAL HIGH (ref 0.61–1.24)
GFR, Estimated: 53 mL/min — ABNORMAL LOW (ref >60)
Glucose, Bld: 127 mg/dL — ABNORMAL HIGH (ref 70–99)
Potassium: 4 mmol/L (ref 3.5–5.1)
Sodium: 138 mmol/L (ref 135–145)

## 2022-12-05 LAB — URINALYSIS, ROUTINE W REFLEX MICROSCOPIC
Bilirubin Urine: NEGATIVE
Glucose, UA: NEGATIVE mg/dL
Hgb urine dipstick: NEGATIVE
Ketones, ur: NEGATIVE mg/dL
Leukocytes,Ua: NEGATIVE
Nitrite: NEGATIVE
Protein, ur: NEGATIVE mg/dL
Specific Gravity, Urine: 1.005 (ref 1.005–1.030)
pH: 5 (ref 5.0–8.0)

## 2022-12-05 LAB — AEROBIC/ANAEROBIC CULTURE W GRAM STAIN (SURGICAL/DEEP WOUND)
Culture: NO GROWTH
Gram Stain: NONE SEEN
Gram Stain: NONE SEEN

## 2022-12-05 LAB — HEMOGLOBIN AND HEMATOCRIT, BLOOD
HCT: 33.7 % — ABNORMAL LOW (ref 39.0–52.0)
Hemoglobin: 11 g/dL — ABNORMAL LOW (ref 13.0–17.0)

## 2022-12-05 LAB — GLUCOSE, CAPILLARY
Glucose-Capillary: 80 mg/dL (ref 70–99)
Glucose-Capillary: 122 mg/dL — ABNORMAL HIGH (ref 70–99)
Glucose-Capillary: 120 mg/dL — ABNORMAL HIGH (ref 70–99)
Glucose-Capillary: 121 mg/dL — ABNORMAL HIGH (ref 70–99)

## 2022-12-05 MED ORDER — TAMSULOSIN HCL 0.4 MG PO CAPS
0.4000 mg | ORAL_CAPSULE | Freq: Every day | ORAL | Status: DC
  Administered 2022-12-05 – 2022-12-09 (×5): 0.4 mg via ORAL
  Filled 2022-12-05 (×5): qty 1

## 2022-12-05 MED ORDER — PANTOPRAZOLE SODIUM 40 MG PO TBEC
40.0000 mg | DELAYED_RELEASE_TABLET | Freq: Two times a day (BID) | ORAL | Status: DC
  Administered 2022-12-05 – 2022-12-09 (×9): 40 mg via ORAL
  Filled 2022-12-05 (×8): qty 1

## 2022-12-05 MED ORDER — FAMOTIDINE 20 MG PO TABS
20.0000 mg | ORAL_TABLET | Freq: Every day | ORAL | Status: DC
  Administered 2022-12-05 – 2022-12-08 (×4): 20 mg via ORAL
  Filled 2022-12-05 (×4): qty 1

## 2022-12-05 NOTE — Progress Notes (Signed)
[facility] PROGRESS NOTE  UZZIEL RUSSEY (DOB: 02/01/1956) HUT:[id number] PCP: [name], MD  Brief Narrative: Joshua [name] is a 66 y.o. male with a history of poorly healing wound of the left lateral foot, diabetes type 2 complicated by diabetic poorly neuropathy, chronic diastolic heart failure, CKD stage IIIb, creatinine baseline 2--2.4, hypertension, hyperlipidemia who presents with nonhealing wound of the left foot after refer for admission from podiatry clinic. Podiatry recommended BKA though the patient declined, opting for attempted limb salvage. Vascular consulted for angiogram 12/18, found a large dominant left posterior tibial artery and occluded anterior tibial artery. Partial 5th MT amputation and biopsy performed with wound vac placement on 12/19. Broad IV antibiotics have continued with ID consult pending and cultures without growth thus far.  Subjective: No new complaints this morning. The RN tells me of some gross hematuria without pain later this AM. Pt has hx BPH, has to strain at urine at times.  Objective: BP (!) 162/79 (BP Location: Left Arm)   Pulse 75   Temp 97.9 F (36.6 C) (Oral)   Resp 20   Ht 5\' 9"  (1.753 m)   Wt 128 kg   SpO2 99%   BMI 41.67 kg/m   Gen: No distress Pulm: Clear, nonlabored  CV: RRR, no MRG or pitting edema GI: Soft, NT, ND, +BS  Neuro: Alert and oriented. No new focal deficits. Ext: Warm, R TMA, left surgical site dressing c/d/I with good vac seal. Skin: No new rashes, lesions or ulcers on visualized skin   Assessment & Plan: Nonhealing diabetic foot ulcer with osteomyelitis: s/p debridement 11/25 and oral antibiotics, worsened. BKA was recommended, as attempt at limb salvage, now s/p partial resection of left 5th MT with skin substitute and wound vac placement on 12/19. - Pseudomonas growing from culture of 5th MT, changed to cefepime. D/w ID, Dr. Gale Journey, recommends 6 weeks IV antibiotics, currently planning cefepime, though  susceptibilities remain pending, through PICC placed 12/22. - Podiatry following, continue wound vac. Anticipated duration typically 4-12 weeks. Podiatry recommends changing out MWF, last changed 12/23. - NWB LLE  PAD s/p right TMA 2017: Arteriogram 12/18 with occluded left anterior tibial artery without reconstitution, dominant large posterior tibial artery noted.  - Antiplatelet, statin  IDT2DM with diabetic neuropathy: HbA1c 7.6%.  - Continue glargine 30u qAM, 10u qPM (can likely combine these doses), mealtime novolog 7u TIDWC + resistant SSI. At inpatient goal. - Continue gabapentin.   Poor SDOH profile, housing insecurity: Without addressing current homelessness, do not suspect any efforts at limb salvage will be fruitful.   - CSW consulted. Will pursue SNF placement at discharge.  Stage IIIb CKD: SCr stable, With his weight, CrCl actually appears to be more within II-IIIa range. - Avoid nephrotoxins. Had CO2 arteriogram.  - Restarted home lasix 40mg  BID (confirmed with patient). CrCl stable and improving. Will monitor intermittently.  CAD s/p multiple PCI, HTN, HLD: Demand myocardial ischemia noted this admission.  - Continue ASA, brilinta - Continue atorvastatin 80mg . LDL is 33, HDL is 43.  - Continue metoprolol, imdur.   Hematuria:  - Start tamsulosin, monitor H/H. No dysuria and on abx that would cover UTI regardless. Check UA. Monitor renal function. For now, stay on antiplatelet agents.  GERD:  - Restart PPI and H2B home doses/schedule  OSA:  - CPAP qHS encouraged  AOCD: Monitor CBC intermittently.  Gout: Quiescent.  - Restart home allopurinol  Morbid obesity: Body mass index is 41.67 kg/m.   [name], MD  [facility] [email or web address] 12/05/2022, 2:16 PM

## 2022-12-06 DIAGNOSIS — E1142 Type 2 diabetes mellitus with diabetic polyneuropathy: Secondary | ICD-10-CM | POA: Diagnosis not present

## 2022-12-06 DIAGNOSIS — N1832 Chronic kidney disease, stage 3b: Secondary | ICD-10-CM | POA: Diagnosis not present

## 2022-12-06 DIAGNOSIS — M86172 Other acute osteomyelitis, left ankle and foot: Secondary | ICD-10-CM | POA: Diagnosis not present

## 2022-12-06 DIAGNOSIS — E11621 Type 2 diabetes mellitus with foot ulcer: Secondary | ICD-10-CM | POA: Diagnosis not present

## 2022-12-06 LAB — GLUCOSE, CAPILLARY
Glucose-Capillary: 101 mg/dL — ABNORMAL HIGH (ref 70–99)
Glucose-Capillary: 145 mg/dL — ABNORMAL HIGH (ref 70–99)
Glucose-Capillary: 142 mg/dL — ABNORMAL HIGH (ref 70–99)

## 2022-12-06 NOTE — Progress Notes (Addendum)
NT helped pt transfer from chair to bed. After getting in bed patient felt unwell and complained of nausea and has been retching. PRN Zofran given, pt felt better after. VS stable BP 109/74. Denies pain, SOB or dizziness

## 2022-12-06 NOTE — Progress Notes (Signed)
[facility] PROGRESS NOTE  Joshua [name] (DOB: [birth year]) OIB:[id number] PCP: [name], MD  Brief Narrative: Joshua [name] is a 66 y.o. male with a history of poorly healing wound of the left lateral foot, diabetes type 2 complicated by diabetic poorly neuropathy, chronic diastolic heart failure, CKD stage IIIb, creatinine baseline 2--2.4, hypertension, hyperlipidemia who presents with nonhealing wound of the left foot after refer for admission from podiatry clinic. Podiatry recommended BKA though the patient declined, opting for attempted limb salvage. Vascular consulted for angiogram 12/18, found a large dominant left posterior tibial artery and occluded anterior tibial artery. Partial 5th MT amputation and biopsy performed with wound vac placement on 12/19. Broad IV antibiotics have continued with ID consult pending and cultures without growth thus far.  Subjective: No hematuria, no abd pain, NVD or other complaints.   Objective: BP (!) 145/83 (BP Location: Left Wrist)   Pulse 91   Temp 98.7 F (37.1 C) (Oral)   Resp 18   Ht 5\' 9"  (1.753 m)   Wt 123 kg   SpO2 98%   BMI 40.04 kg/m   No distress Clear, nonlabored RRR, no JVD laying flat Soft, NT, ND, +BS Wound vac in place  Assessment & Plan: Nonhealing diabetic foot ulcer with osteomyelitis: s/p debridement 11/25 and oral antibiotics, worsened. BKA was recommended, as attempt at limb salvage, now s/p partial resection of left 5th MT with skin substitute and wound vac placement on 12/19. - Pseudomonas growing from culture of 5th MT, changed to cefepime. D/w ID, Dr. Gale Joshua, recommends 6 weeks IV cefepime through PICC placed 12/22. DC flagyl and linezolid with otherwise negative culture data. - Podiatry following, continue wound vac. Anticipated duration typically 4-12 weeks. Podiatry recommends changing out MWF, last changed 12/23. - NWB LLE  PAD s/p right TMA 2017: Arteriogram 12/18 with occluded left anterior tibial  artery without reconstitution, dominant large posterior tibial artery noted.  - Antiplatelet, statin  IDT2DM with diabetic neuropathy: HbA1c 7.6%.  - Continue glargine 30u qAM, 10u qPM (can likely combine these doses), mealtime novolog 7u TIDWC + resistant SSI. Remains at inpatient goal. - Continue gabapentin.   Poor SDOH profile, housing insecurity: Without addressing current homelessness, do not suspect any efforts at limb salvage will be fruitful.   - CSW consulted. Will pursue SNF placement at discharge.  Stage IIIb CKD: SCr stable, With his weight, CrCl actually appears to be more within II-IIIa range. - Avoid nephrotoxins. Had CO2 arteriogram.  - Restarted home lasix 40mg  BID (confirmed with patient). CrCl stable and improved. Will monitor intermittently.  CAD s/p multiple PCI, HTN, HLD: Demand myocardial ischemia noted this admission.  - Continue ASA, brilinta - Continue atorvastatin 80mg . LDL is 33, HDL is 43.  - Continue metoprolol, imdur.   Hematuria:  - Started tamsulosin, monitor H/H. No dysuria and on abx that would cover UTI regardless. UA negative, no reflex to micro. Renal function stable. This has resolved.   GERD:  - Restarted PPI and H2B home doses/schedule  OSA:  - CPAP qHS encouraged  AOCD: Monitor CBC intermittently.  Gout: Quiescent.  - Restart home allopurinol  Morbid obesity: Body mass index is 40.04 kg/m.   [name], MD [facility] [email or web address] 12/06/2022, 12:59 PM

## 2022-12-06 NOTE — Progress Notes (Signed)
ID Brief Note       Component 6 d ago  Specimen Description BONE  Special Requests CUBOID  Gram Stain NO WBC SEEN NO ORGANISMS SEEN  Culture No growth aerobically or anaerobically. Performed at [facility], [place] [address] [place], [place] [address]  Report Status 12/05/2022 FINAL     Component 6 d ago  Specimen Description BONE  Special Requests 5TH METATARSAL  Gram Stain NO WBC SEEN NO ORGANISMS SEEN  Culture RARE PSEUDOMONAS AERUGINOSA CRITICAL RESULT CALLED TO, READ BACK BY AND VERIFIED WITH: RN DENISE M.  [phone] FCP NO ANAEROBES ISOLATED Performed at [facility], [place] [address] [place], [place] [address]  Report Status 12/05/2022 FINAL  Organism ID, Bacteria PSEUDOMONAS AERUGINOSA  Resulting Agency [facility]     Susceptibility   Pseudomonas aeruginosa    MIC    CEFEPIME 2 SENSITIVE Sensitive    CEFTAZIDIME 4 SENSITIVE Sensitive    CIPROFLOXACIN <=0.25 SENS... Sensitive    GENTAMICIN <=1 SENSITIVE Sensitive    IMIPENEM 1 SENSITIVE Sensitive    PIP/TAZO 16 SENSITIVE Sensitive

## 2022-12-07 DIAGNOSIS — E11621 Type 2 diabetes mellitus with foot ulcer: Secondary | ICD-10-CM | POA: Diagnosis not present

## 2022-12-07 DIAGNOSIS — E1142 Type 2 diabetes mellitus with diabetic polyneuropathy: Secondary | ICD-10-CM | POA: Diagnosis not present

## 2022-12-07 DIAGNOSIS — N1832 Chronic kidney disease, stage 3b: Secondary | ICD-10-CM | POA: Diagnosis not present

## 2022-12-07 DIAGNOSIS — M86172 Other acute osteomyelitis, left ankle and foot: Secondary | ICD-10-CM | POA: Diagnosis not present

## 2022-12-07 LAB — GLUCOSE, CAPILLARY
Glucose-Capillary: 89 mg/dL (ref 70–99)
Glucose-Capillary: 133 mg/dL — ABNORMAL HIGH (ref 70–99)
Glucose-Capillary: 71 mg/dL (ref 70–99)
Glucose-Capillary: 112 mg/dL — ABNORMAL HIGH (ref 70–99)

## 2022-12-07 MED ORDER — SODIUM CHLORIDE 0.9 % IV SOLN
2.0000 g | Freq: Three times a day (TID) | INTRAVENOUS | Status: DC
  Administered 2022-12-07 – 2022-12-09 (×6): 2 g via INTRAVENOUS
  Filled 2022-12-07 (×6): qty 12.5

## 2022-12-07 NOTE — Progress Notes (Signed)
PHARMACY NOTE:  ANTIMICROBIAL RENAL DOSAGE ADJUSTMENT  Current antimicrobial regimen includes a mismatch between antimicrobial dosage and estimated renal function.  As per policy approved by the Pharmacy & Therapeutics and Medical Executive Committees, the antimicrobial dosage will be adjusted accordingly.  Current antimicrobial dosage:  cefepime 2g IV q12h  Indication: Pseudomonas in wound  Renal Function:  Estimated Creatinine Clearance: 66 mL/min (A) (by C-G formula based on SCr of 1.47 mg/dL (H)). $RemoveBef'[]'[id number]$      On intermittent HD, scheduled: $RemoveBefore'[]'kNlUCdRIeDegl$      On CRRT    Antimicrobial dosage has been changed to:  cefepime 2g IV q8h  Additional comments:   Thank you for allowing pharmacy to be a part of this patient's care.  Einar Grad, [facility] 12/07/2022 9:25 AM

## 2022-12-07 NOTE — TOC Progression Note (Signed)
Transition of Care [facility]) - Progression Note    Patient Details  Name: Joshua [name] MRN: [id number] Date of Birth: [birth year]  Transition [facility]) CM/SW [place], [place] Phone Number: 12/07/2022, 3:39 PM  Clinical Narrative:     CSW provided SNF bed offers to pt. He chooses [facility]. CSW explains transition process and that CSW will need to confirm bed offers and that SNF can provide wound vac and IV abx. CSW inquired about CPAP; pt has one that isn't working. He is not sure what agency provided CPAP and explained it is 64 years old.   CSW contacted [facility] to inquire about IV abx, woundvac, and CPAP; Awaiting response. Will initiate insurance auth once SNF confirms offer.   Expected Discharge Plan: [place] Barriers to Discharge: Insurance Authorization, Other (must enter comment) (SNF needs to order wound vac and cpap)  Expected Discharge Plan and Services In-house Referral: Clinical Social Work Discharge Planning Services: CM Consult   Living arrangements for the past 2 months: Hotel/Motel (extended stay-)                           HH Arranged: RN, Disease Management, PT, OT HH Agency: [place] Date [place]: 11/30/22 Time [place]: 1528 Representative spoke with at [place]: [place] ([place]) Interventions [place]: Food Insecurity Present (11/30/2022)  Housing: High Risk (11/26/2022)  Transportation Needs: Unmet Transportation Needs (11/26/2022)  Utilities: Not At Risk (11/26/2022)  Depression (PHQ2-9): High Risk (08/26/2022)  Tobacco Use: Low Risk  (12/01/2022)    Readmission Risk Interventions    11/08/2022    2:21 PM  Readmission Risk Prevention Plan  Transportation Screening Complete  PCP or Specialist Appt within 5-7 Days Complete  Home Care Screening Complete  Medication Review (RN CM) Complete

## 2022-12-07 NOTE — Progress Notes (Signed)
Physical Therapy Treatment Patient Details Name: Joshua [name] MRN: [id number] DOB: [birth year] Today's Date: 12/07/2022   History of Present Illness 66 y.o. male  who is admitted to [facility] on 11/25/2022 with nonhealing wound of the left foot after presenting from podiatry clinic to [facility] ED complaining of such. Found to have poor circulation and osteomyelitis S/p 12/18 ultrasound guided cannulation of R common femoral artery and 12/19 partial resection of L 5th metatarsal  and skin graft PMH: poorly healing wound to the left lateral foot, type 2 diabetes mellitus complicated by diabetic peripheral polyneuropathy, chronic diastolic heart failure, CKD 3B with baseline creatinine range 2-2.4, essential hypertension, hyperlipidemia,    PT Comments    Pt supine in bed on entry, Prafo boot on L foot, but very ill fitting. Discussed benefits in decreasing loss of dorsiflexion and neutral foot alignment. Educated pt on proper donning and encouraged pt to fix when boot has slid in bed. Pt able to perform self donning and fixing alignment after positional change. Pt is mod I for bed mobility and min guard for transfers with RW. Pt performed seated exercises. Pt needs SNF level rehab at discharge to protect L foot and progress mobility.     Recommendations for follow up therapy are one component of a multi-disciplinary discharge planning process, led by the attending physician.  Recommendations may be updated based on patient status, additional functional criteria and insurance authorization.  Follow Up Recommendations  Skilled nursing-short term rehab (<3 hours/day) Can patient physically be transported by private vehicle: Yes   Assistance Recommended at Discharge Intermittent Supervision/Assistance  Patient can return home with the following Other (comment) (assist for wound care)   Equipment Recommendations  Wheelchair (measurements PT);Wheelchair cushion (measurements PT)     Recommendations for Other Services OT consult     Precautions / Restrictions Precautions Precautions: Fall Precaution Comments: wound vac L foot Required Braces or Orthoses: Other Brace Splint/Cast - Date Prophylactic Dressing Applied (if applicable): 15/61/53 Other Brace: R post op shoe Restrictions Weight Bearing Restrictions: Yes LLE Weight Bearing: Non weight bearing     Mobility  Bed Mobility Overal bed mobility: Modified Independent             General bed mobility comments: HoB elevated and use of bed rail to pull to EoB, min A for management of lines    Transfers Overall transfer level: Needs assistance Equipment used: Rolling walker (2 wheels) Transfers: Sit to/from Stand Sit to Stand: From elevated surface, +2 safety/equipment, Min guard   Step pivot transfers: Min assist       General transfer comment: Initially pt needed mod assist to stand but progressing to min with more practice. Hand placement one hand on chair and one on RW. Pt did better maintaining NWB on L LE with verbal cues only. Pt took pivotal hops to right to chair with initial mod assist progressing to min assist.           Balance Overall balance assessment: Needs assistance Sitting-balance support: Feet supported, No upper extremity supported Sitting balance-Leahy Scale: Normal Sitting balance - Comments: able to don cast shoe on R foot   Standing balance support: Bilateral upper extremity supported, Reliant on assistive device for balance Standing balance-Leahy Scale: Poor Standing balance comment: requires increased assist to maintain L LE NWB and for balance wityh RW use  Cognition Arousal/Alertness: Awake/alert Behavior During Therapy: WFL for tasks assessed/performed Overall Cognitive Status: Within Functional Limits for tasks assessed                                          Exercises General Exercises - Upper  Extremity Chair Push Up: AROM, Seated General Exercises - Lower Extremity Ankle Circles/Pumps: AROM, Both, 10 reps, Supine Hip Flexion/Marching: AROM, Both, 10 reps, Seated (worked on decreased back involvement)    General Comments General comments (skin integrity, edema, etc.): Pt with Prafo dorsiflexion brace on L foot to maintain dorsiflexion and decrease ankle inversion, spent increased time educating pt on correctly donning and empowered him to fix boot fitting when it becomes illfitting      Pertinent Vitals/Pain Pain Assessment Pain Assessment: Faces Faces Pain Scale: Hurts little more Pain Location: back Pain Descriptors / Indicators: Discomfort Pain Intervention(s): Limited activity within patient's tolerance, Monitored during session, Repositioned     PT Goals (current goals can now be found in the care plan section) Acute Rehab PT Goals Patient Stated Goal: keep L LE PT Goal Formulation: With patient Time For Goal Achievement: 12/15/22 Potential to Achieve Goals: Good Progress towards PT goals: Progressing toward goals    Frequency    Min 3X/week      PT Plan Current plan remains appropriate       AM-PAC PT "6 Clicks" Mobility   Outcome Measure  Help needed turning from your back to your side while in a flat bed without using bedrails?: None Help needed moving from lying on your back to sitting on the side of a flat bed without using bedrails?: A Little Help needed moving to and from a bed to a chair (including a wheelchair)?: A Lot Help needed standing up from a chair using your arms (e.g., wheelchair or bedside chair)?: A Lot Help needed to walk in hospital room?: Total Help needed climbing 3-5 steps with a railing? : Total 6 Click Score: 13    End of Session Equipment Utilized During Treatment: Gait belt Activity Tolerance: Patient tolerated treatment well Patient left: in chair;with call bell/phone within reach;with chair alarm set Nurse  Communication: Mobility status;Precautions;Weight bearing status PT Visit Diagnosis: Unsteadiness on feet (R26.81);Other abnormalities of gait and mobility (R26.89);Difficulty in walking, not elsewhere classified (R26.2) Pain - Right/Left: Left Pain - part of body: Ankle and joints of foot     Time: 1345-1450 PT Time Calculation (min) (ACUTE ONLY): 65 min  Charges:  $Therapeutic Exercise: 23-37 mins $Orthotics Fit Training: 8-22 mins                     Senie Lanese B. Migdalia Dk PT, DPT Acute Rehabilitation Services Please use secure chat or  Call Office [phone]    [place] 12/07/2022, 4:43 PM

## 2022-12-07 NOTE — Plan of Care (Signed)

## 2022-12-07 NOTE — Progress Notes (Signed)
[facility] PROGRESS NOTE  Joshua [name] (DOB: 1956-07-27) Joshua [name] PCP: [name], MD  Brief Narrative: Joshua [name] is a 66 y.o. male with a history of poorly healing wound of the left lateral foot, diabetes type 2 complicated by diabetic poorly neuropathy, chronic diastolic heart failure, CKD stage IIIb, creatinine baseline 2--2.4, hypertension, hyperlipidemia who presents with nonhealing wound of the left foot after refer for admission from podiatry clinic. Podiatry recommended BKA though the patient declined, opting for attempted limb salvage. Vascular consulted for angiogram 12/18, found a large dominant left posterior tibial artery and occluded anterior tibial artery. Partial 5th MT amputation and biopsy performed with wound vac placement on 12/19. Broad IV antibiotics were continued until Pseudomonas grew in bone Cx. ID has recommended 6 weeks of IV cefepime thru PICC. The patient is medically stable for discharge to SNF, bed pending.  Subjective: No new complaints.   Objective: BP (!) 144/77 (BP Location: Left Arm)   Pulse 86   Temp 97.7 F (36.5 C) (Oral)   Resp 16   Ht 5\' 9"  (1.753 m)   Wt 123.7 kg   SpO2 100%   BMI 40.27 kg/m   Gen: No distress Pulm: Clear, nonlabored  CV: RRR, no pitting edema or JVD GI: Soft, NT, ND, +BS Neuro: Alert and oriented. No new focal deficits. Ext: Warm, R TMA normal. L foot wound with wound vac, minimal output, boot donned, dangling EOB. Skin: No new rashes, lesions or ulcers on visualized skin   Assessment & Plan: Nonhealing diabetic foot ulcer with osteomyelitis: s/p debridement 11/25 and oral antibiotics, worsened. BKA was recommended, as attempt at limb salvage, now s/p partial resection of left 5th MT with skin substitute and wound vac placement on 12/19. - Pseudomonas growing from culture of 5th MT, changed to cefepime. Per ML:YYTKPTWS 6 weeks IV cefepime through PICC placed 12/22.   - Podiatry following, continue  wound vac. Anticipated duration typically 4-12 weeks. Podiatry recommends changing out MWF - NWB LLE  PAD s/p right TMA 2017: Arteriogram 12/18 with occluded left anterior tibial artery without reconstitution, dominant large posterior tibial artery noted.  - Antiplatelet, statin  IDT2DM with diabetic neuropathy: HbA1c 7.6%.  - Continue glargine 30u qAM, 10u qPM (can likely combine these doses), mealtime novolog 7u TIDWC + resistant SSI. Remains at inpatient goal. - Continue gabapentin.   Poor SDOH profile, housing insecurity: Without addressing current homelessness, do not suspect any efforts at limb salvage will be fruitful.   - CSW consulted. Will pursue SNF placement at discharge.  Stage II CKD: SCr stable and improved. Initially noted to have stage IIIb CKD, though with his weight, CrCl baseline actually appears to be >39ml/min - Avoid nephrotoxins. Had CO2 arteriogram.  - Restarted home lasix 40mg  BID (confirmed with patient). CrCl stable and improved. Will monitor intermittently.  CAD s/p multiple PCI, HTN, HLD: Demand myocardial ischemia noted this admission.  - Continue ASA, brilinta - Continue atorvastatin 80mg . LDL is 33, HDL is 43.  - Continue metoprolol, imdur.   Hematuria: Transient problem, has not recurred. Work up negative.  - Started tamsulosin which will continue due to straining at urine.   GERD:  - Restarted PPI and H2B home doses/schedule  OSA:  - CPAP qHS encouraged  AOCD: Monitor CBC intermittently.  Gout: Quiescent.  - Restart home allopurinol  Morbid obesity: Body mass index is 40.27 kg/m.   [name], MD [facility] [email or web address] 12/07/2022, 3:27 PM

## 2022-12-08 ENCOUNTER — Inpatient Hospital Stay (HOSPITAL_COMMUNITY): Payer: Medicare Other

## 2022-12-08 DIAGNOSIS — S81802A Unspecified open wound, left lower leg, initial encounter: Secondary | ICD-10-CM | POA: Diagnosis not present

## 2022-12-08 DIAGNOSIS — R4182 Altered mental status, unspecified: Secondary | ICD-10-CM

## 2022-12-08 LAB — GLUCOSE, CAPILLARY
Glucose-Capillary: 59 mg/dL — ABNORMAL LOW (ref 70–99)
Glucose-Capillary: 116 mg/dL — ABNORMAL HIGH (ref 70–99)
Glucose-Capillary: 60 mg/dL — ABNORMAL LOW (ref 70–99)
Glucose-Capillary: 95 mg/dL (ref 70–99)
Glucose-Capillary: 86 mg/dL (ref 70–99)
Glucose-Capillary: 96 mg/dL (ref 70–99)
Glucose-Capillary: 64 mg/dL — ABNORMAL LOW (ref 70–99)
Glucose-Capillary: 86 mg/dL (ref 70–99)

## 2022-12-08 MED ORDER — CEFEPIME IV (FOR PTA / DISCHARGE USE ONLY): 2.0000 g | Freq: Three times a day (TID) | INTRAVENOUS | 0 refills | Status: DC

## 2022-12-08 MED ORDER — INSULIN ASPART 100 UNIT/ML IJ SOLN: 0.0000 [IU] | Freq: Three times a day (TID) | INTRAMUSCULAR | 11 refills | Status: DC

## 2022-12-08 MED ORDER — INSULIN GLARGINE-YFGN 100 UNIT/ML ~~LOC~~ SOLN
20.0000 [IU] | Freq: Every day | SUBCUTANEOUS | Status: DC
  Administered 2022-12-08: 20 [IU] via SUBCUTANEOUS
  Filled 2022-12-08 (×2): qty 0.2

## 2022-12-08 MED ORDER — INSULIN GLARGINE-YFGN 100 UNIT/ML ~~LOC~~ SOLN
8.0000 [IU] | Freq: Every day | SUBCUTANEOUS | Status: DC
  Filled 2022-12-08: qty 0.08

## 2022-12-08 MED ORDER — FUROSEMIDE 40 MG PO TABS: 40.0000 mg | ORAL_TABLET | Freq: Two times a day (BID) | ORAL | Status: DC

## 2022-12-08 MED ORDER — INSULIN ASPART 100 UNIT/ML IJ SOLN: 7.0000 [IU] | Freq: Three times a day (TID) | INTRAMUSCULAR | 11 refills | Status: DC

## 2022-12-08 MED ORDER — LANTUS SOLOSTAR 100 UNIT/ML ~~LOC~~ SOPN: 15.0000 [IU] | PEN_INJECTOR | Freq: Two times a day (BID) | SUBCUTANEOUS | 0 refills | Status: DC

## 2022-12-08 MED ORDER — METOPROLOL SUCCINATE ER 25 MG PO TB24: 75.0000 mg | ORAL_TABLET | Freq: Every day | ORAL | 0 refills | Status: DC

## 2022-12-08 MED ORDER — INSULIN GLARGINE-YFGN 100 UNIT/ML ~~LOC~~ SOLN
3.0000 [IU] | Freq: Every day | SUBCUTANEOUS | Status: DC
  Filled 2022-12-08: qty 0.03

## 2022-12-08 MED ORDER — INSULIN ASPART 100 UNIT/ML IJ SOLN
0.0000 [IU] | Freq: Three times a day (TID) | INTRAMUSCULAR | Status: DC
  Administered 2022-12-09 (×2): 1 [IU] via SUBCUTANEOUS
  Administered 2022-12-09: 2 [IU] via SUBCUTANEOUS

## 2022-12-08 MED ORDER — TAMSULOSIN HCL 0.4 MG PO CAPS: 0.4000 mg | ORAL_CAPSULE | Freq: Every day | ORAL | 0 refills | Status: AC

## 2022-12-08 NOTE — Progress Notes (Signed)
Orthostatic vitals recorded in flow sheet, 12-08-22 at 18:35.

## 2022-12-08 NOTE — Progress Notes (Addendum)
Hypoglycemic Event 0808 am  CBG: 59  Treatment: 4 oz juice/soda  Symptoms: None  Follow-up CBG: FGIL:2004  CBG Result:60  Possible Reasons for Event: Medication regimen:    Comments/MD notified:yes Florene Glen, MD     Jerrel Ivory Karriker

## 2022-12-08 NOTE — Discharge Summary (Addendum)
Physician Discharge Summary  Joshua [name] GSU:[id number] DOB: June 30, 1956 DOA: 11/25/2022  PCP: [name], MD  Admit date: 11/25/2022 Discharge date: 12/08/2022  Time spent: 40 minutes  Recommendations for Outpatient Follow-up:  Follow outpatient CBC/CMP  Follow with podiatry outpatient - continue nonweightbearing, wound vac changes per podiatry Follow with ID as scheduled Insulin dose reduced at discharge - follow outpatient and adjust as needed based on blood sugars Follow lower urinary tract symptoms - started on flomax Follow hematuria - repeat UA with resolved hematuria   Discharge held in setting of LOC Based on description, sounds like syncopal episode (eyes rolled back, no shaking movement of arms or legs, no incontinence or tongue biting - head moving around in circle).  Episode lasted about 1 minute.  Took 10-15 seconds for him to come around.  C/o pressure in forehead.  Vitals with BP 144/82, HR 90, satting 100% on RA.  Orthostatics subsequently done negative.  EEG negative.  EKG is pending.  Head CT pending.  Consider d/c 12/28 AM if no concerning findings.   Discharge Diagnoses:  Principal Problem:   Non-healing wound of left lower extremity Active Problems:   Hyperlipidemia   Acute osteomyelitis of hindfoot, left (HCC)   Chronic diastolic CHF (congestive heart failure) (HCC)   Stage 3b chronic kidney disease (CKD) (HCC)   DM2 (diabetes mellitus, type 2) (HCC)   Essential hypertension   Preop cardiovascular exam   Cardiac chest pain   Acute osteomyelitis of metatarsal bone of left foot ([place])   Diabetic foot ulcer with osteomyelitis ([place])   Discharge Condition: stable  Diet recommendation: heart healthy, diabetic  Filed Weights   12/06/22 0502 12/07/22 0603 12/08/22 0430  Weight: 123 kg 123.7 kg 123.6 kg    History of present illness:  Joshua [name] is Joshua [name] 66 y.o. male with Joshua [name] history of poorly healing wound of the left lateral foot, diabetes type 2  complicated by diabetic poorly neuropathy, chronic diastolic heart failure, CKD stage IIIb, creatinine baseline 2--2.4, hypertension, hyperlipidemia who presents with nonhealing wound of the left foot after refer for admission from podiatry clinic. Podiatry recommended BKA, but the patient declined, opting for attempted limb salvage. Vascular consulted for angiogram 12/18, found Joshua [name] large dominant left posterior tibial artery and occluded anterior tibial artery. Partial 5th MT amputation and biopsy performed with wound vac placement on 12/19. Broad IV antibiotics were continued until Pseudomonas grew in bone Cx. ID has recommended 6 weeks of IV cefepime thru PICC. The patient is medically stable for discharge to SNF.  Discharged on 12/27 with plan for outpatient ID and podiatry follow up.  Hospital Course:  Assessment and Plan: Nonhealing diabetic foot ulcer with osteomyelitis: s/p debridement 11/25 and oral antibiotics, worsened. BKA was recommended, as attempt at limb salvage, now s/p partial resection of left 5th MT with skin substitute and wound vac placement on 12/19. - Pseudomonas growing from culture of 5th MT, changed to cefepime. Per OP:[id number] 6 weeks IV cefepime through PICC placed 12/22.   - Podiatry following, continue wound vac. Anticipated duration typically 4-12 weeks. Podiatry recommends changing out MWF - NWB LLE - follow up with ID and podiatry outpatient as scheduled   PAD s/p right TMA 2017: Arteriogram 12/18 with occluded left anterior tibial artery without reconstitution, dominant large posterior tibial artery noted.  - Antiplatelet, statin   IDT2DM with diabetic neuropathy: HbA1c 7.6%.  - hypoglycemia this AM - reduce basal at discharge to 15 units twice daily, continue mealtime with 7  units TID with meals.  Will discharge on sensitive SSI. - Continue gabapentin.    Poor SDOH profile, housing insecurity: Without addressing current homelessness, do not suspect any efforts at  limb salvage will be fruitful.   - CSW consulted. Will pursue SNF placement at discharge.   Stage II CKD: SCr stable and improved. Initially noted to have stage IIIb CKD, though with his weight, CrCl baseline actually appears to be >49m/min - Avoid nephrotoxins. Had CO2 arteriogram.  - Continue lasix 40 mg BID. CrCl stable and improved. Will monitor intermittently.   CAD s/p multiple PCI, HTN, HLD: Demand myocardial ischemia noted this admission.  - Continue ASA, brilinta - Continue atorvastatin 881m LDL is 33, HDL is 43.  - Continue metoprolol, imdur.    Hematuria: Transient problem, has not recurred.  - recent UA negative for blood - follow and w/u additionally outpatient as needed  LUTS  BPH - straining to urinate, continue flomax   GERD:  - Restarted PPI and H2B home doses/schedule   OSA:  - CPAP qHS encouraged   AOCD: Monitor CBC intermittently.   Gout: Quiescent.  allopurinol   Morbid obesity: Body mass index is 40.27 kg/m.     Procedures: 12/15 Summary:  Left: 30-49% stenosis noted in the superficial femoral artery. 30-49%  stenosis noted in the posterior tibial artery. Total occlusion of the  distal anterior tibial artery.   12/18 Procedure Performed: 1.  Ultrasound-guided cannulation right common femoral artery 2.  CO2 aortogram 3.  Selection of left common femoral artery and selection of left popliteal artery at the left lower extremity with left lower extremity angiogram  4.  Mynx device closure right common femoral artery 5.  Moderate sedation with fentanyl and Versed for 26 minutes  The aorta and iliac segments are free of flow-limiting stenosis.  Left common femoral and SFA are patent with brisk flow using CO2.  Contrast below the knee demonstrates an occluded anterior tibial artery just after the takeoff that does not reconstitute with very large posterior tibial artery which is the dominant runoff to the foot and peroneal artery to the level of the  ankle.   Patient will need diligent wound care and remains high risk for below-knee amputation.  12/19 Procedures:             1) partial resection metatarsal fifth             2) open bone biopsy of cuboid             3) preparation of wound bed for skin substitute             4) application of skin substitute             5) application negative pressure wound therapy  Consultations: [facility] Podiatry ID  Discharge Exam: Vitals:   12/08/22 0430 12/08/22 0800  BP: (!) 154/79 134/68  Pulse: 84 86  Resp:  18  Temp: 97.8 F (36.6 C) (!) 97.4 F (36.3 C)  SpO2: 98% 99%   No complaints Excited to d/c today  General: No acute distress. Cardiovascular: RRR Lungs: unlabored Abdomen: Soft, nontender, nondistended Neurological: Alert and oriented 3. Moves all extremities 4 with equal strength. Cranial nerves II through XII grossly intact. Extremities: LLE with brace in place, wound vac  Discharge Instructions   Discharge Instructions     (HEART FAILURE PATIENTS) Call MD:  Anytime you have any of the following symptoms: 1) 3 pound weight gain in 24 hours  or 5 pounds in 1 week 2) shortness of breath, with or without Jeraldine Primeau dry hacking cough 3) swelling in the hands, feet or stomach 4) if you have to sleep on extra pillows at night in order to breathe.   Complete by: As directed    [facility] pharmacist to adjust dose for Vancomycin, Aminoglycosides and other anti-infective therapies as requested by physician.   Complete by: As directed    [facility] to provide Cath Flo 4m   Complete by: As directed    Administer for PICC line occlusion and as ordered by physician for other access device issues.   Anaphylaxis Kit: Provided to treat any anaphylactic reaction to the medication being provided to the patient if First Dose or when requested by physician   Complete by: As directed    Epinephrine 151mml vial / amp: Administer 0.85m80m0.85ml56mubcutaneously once for  moderate to severe anaphylaxis, nurse to call physician and pharmacy when reaction occurs and call 911 if needed for immediate care   Diphenhydramine 50mg55mIV vial: Administer 25-50mg 39mM PRN for first dose reaction, rash, itching, mild reaction, nurse to call physician and pharmacy when reaction occurs   Sodium Chloride 0.9% NS 500ml I45mdminister if needed for hypovolemic blood pressure drop or as ordered by physician after call to physician with anaphylactic reaction   Call MD for:  difficulty breathing, headache or visual disturbances   Complete by: As directed    Call MD for:  extreme fatigue   Complete by: As directed    Call MD for:  hives   Complete by: As directed    Call MD for:  persistant dizziness or light-headedness   Complete by: As directed    Call MD for:  persistant nausea and vomiting   Complete by: As directed    Call MD for:  redness, tenderness, or signs of infection (pain, swelling, redness, odor or green/yellow discharge around incision site)   Complete by: As directed    Call MD for:  severe uncontrolled pain   Complete by: As directed    Call MD for:  temperature >100.4   Complete by: As directed    Change dressing on IV access line weekly and PRN   Complete by: As directed    Diet - low sodium heart healthy   Complete by: As directed    Discharge instructions   Complete by: As directed    You were seen for Joshua [name] diabetic foot infection and osteomyelitis (bone infection).  You've been treated with Mariacristina Aday partial resection of the left 5th metatarsal with skin substitute and wound vac placement by podiatry.  You'll need to be nonweightbearing to the left leg.  Continue the wound vac as recommended by podiatry.  We're sending you with 6 weeks of antibiotics to treat the bone infection.  It's extremely important that you follow up with podiatry and follow podiatry's instructions to give this wound the best chance to heal.  Your at high risk to need further  amputation.  You should follow up with infectious disease outpatient.   I've adjusted your diabetes medicine since you've had low blood sugars here.  We'll discharge you with 15 units lantus twice daily and 7 units aspart with meals.  In addition, we'll continue sliding scale insulin.  They should continue to adjust your basal and bolus regimen at the skilled nursing facility.  You had transient blood in the urine which has resolved.  You should follow this with your  outpatient doctors.  You may need additional workup if this is recurrent.  Return for new, recurrent, or worsening issues.  Please ask your PCP to request records from this hospitalization so they know what was done and what the next steps will be.   Discharge wound care:   Complete by: As directed    Continue MWF wound vac changes per podiatry   Flush IV access with Sodium Chloride 0.9% and Heparin 10 units/ml or 100 units/ml   Complete by: As directed    Home infusion instructions - [facility]   Complete by: As directed    Instructions: Flush IV access with Sodium Chloride 0.9% and Heparin 10units/ml or 100units/ml   Change dressing on IV access line: Weekly and PRN   Instructions Cath Flo 70m: Administer for PICC Line occlusion and as ordered by physician for other access device   [facility] pharmacist to adjust dose for: Vancomycin, Aminoglycosides and other anti-infective therapies as requested by physician   Increase activity slowly   Complete by: As directed    Method of administration may be changed at the discretion of home infusion pharmacist based upon assessment of the patient and/or caregiver's ability to self-administer the medication ordered   Complete by: As directed       Allergies as of 12/08/2022       Reactions   Chlorthalidone Other (See Comments)   Causes dehydration, kidneys shut down        Medication List     STOP taking these medications     amoxicillin-clavulanate 875-125 MG tablet Commonly known as: AUGMENTIN   doxycycline 100 MG tablet Commonly known as: VIBRA-TABS       TAKE these medications    allopurinol 100 MG tablet Commonly known as: ZYLOPRIM Take 1 tablet (100 mg total) by mouth daily.   ascorbic acid 500 MG tablet Commonly known as: VITAMIN C Take 1 tablet (500 mg total) by mouth 2 (two) times daily.   aspirin EC 81 MG tablet Take 81 mg by mouth daily.   atorvastatin 80 MG tablet Commonly known as: LIPITOR Take 80 mg by mouth daily.   Brilinta 90 MG Tabs tablet Generic drug: ticagrelor Take 1 tablet by mouth twice daily What changed: how much to take   ceFEPime  IVPB Commonly known as: MAXIPIME Inject 2 g into the vein every 8 (eight) hours. Indication:  Pseudomonas aeruginosa osteomyelitis First Dose: Yes Last Day of Therapy:  01/13/23 Labs - Once weekly:  CBC/D and BMP, Labs - Every other week:  ESR and CRP Method of administration: IV Push Method of administration may be changed at the discretion of home infusion pharmacist based upon assessment of the patient and/or caregiver's ability to self-administer the medication ordered.   dapagliflozin propanediol 10 MG Tabs tablet Commonly known as: FARXIGA Take 10 mg by mouth daily.   famotidine 20 MG tablet Commonly known as: PEPCID Take 20 mg by mouth at bedtime.   FreeStyle LMinto2 Reader DKerrin MoSee admin instructions.   FreeStyle Libre 2 Sensor Misc APPLY TOPICALLY EVERY 14 DAYS   furosemide 40 MG tablet Commonly known as: LASIX Take 1 tablet (40 mg total) by mouth 2 (two) times daily. What changed:  medication strength when to take this additional instructions   gabapentin 100 MG capsule Commonly known as: NEURONTIN Take 1 capsule by mouth at bedtime   insulin aspart 100 UNIT/ML injection Commonly known as: novoLOG Inject 7 Units into the skin 3 (three) times  daily with meals.   insulin aspart 100 UNIT/ML  injection Commonly known as: novoLOG Inject 0-9 Units into the skin 3 (three) times daily with meals. CBG < 70: treat for low blood sugar Implement  CBG 70 - 120: 0 units  CBG 121 - 150: 1 unit  CBG 151 - 200: 2 units  CBG 201 - 250: 3 units  CBG 251 - 300: 5 units  CBG 301 - 350: 7 units  CBG 351 - 400: 9 units  CBG > 400: call MD   isosorbide mononitrate 60 MG 24 hr tablet Commonly known as: IMDUR Take 3 tablets (180 mg total) by mouth daily.   Lantus SoloStar 100 UNIT/ML Solostar Pen Generic drug: insulin glargine Inject 15 Units into the skin 2 (two) times daily. Adjust as needed What changed:  how much to take additional instructions   latanoprost 0.005 % ophthalmic solution Commonly known as: XALATAN Place 1 drop into both eyes at bedtime.   metoprolol succinate 25 MG 24 hr tablet Commonly known as: TOPROL-XL Take 3 tablets (75 mg total) by mouth daily. Start taking on: December 09, 2022   nitroGLYCERIN 0.4 MG SL tablet Commonly known as: NITROSTAT Place 1 tablet (0.4 mg total) under the tongue every 5 (five) minutes as needed for chest pain.   oxyCODONE-acetaminophen 5-325 MG tablet Commonly known as: PERCOCET/ROXICET Take 1 tablet by mouth every 6 (six) hours as needed for moderate pain.   pantoprazole 40 MG tablet Commonly known as: PROTONIX TAKE 1 TABLET BY MOUTH TWICE DAILY BEFORE MEAL(S) What changed: See the new instructions.   polyethylene glycol powder 17 GM/SCOOP powder Commonly known as: GLYCOLAX/MIRALAX Take 1 capful  (17 g) by mouth daily as needed for moderate constipation.   Senexon-S 8.6-50 MG tablet Generic drug: senna-docusate Take 1 tablet by mouth 2 (two) times daily.   tamsulosin 0.4 MG Caps capsule Commonly known as: FLOMAX Take 1 capsule (0.4 mg total) by mouth daily after supper.   Tradjenta 5 MG Tabs tablet Generic drug: linagliptin Take 1 tablet (5 mg total) by mouth daily.               Discharge Care Instructions   (From admission, onward)           Start     Ordered   12/08/22 0000  Change dressing on IV access line weekly and PRN  (Home infusion instructions - [facility] )        12/08/22 1405   12/08/22 0000  Discharge wound care:       Comments: Continue MWF wound vac changes per podiatry   12/08/22 1405           Allergies  Allergen Reactions   Chlorthalidone Other (See Comments)    Causes dehydration, kidneys shut down    Contact information for after-discharge care     [facility] .   Service: Skilled Nursing Contact information: [address] [place] [phone]                      The results of significant diagnostics from this hospitalization (including imaging, microbiology, ancillary and laboratory) are listed below for reference.    Significant Diagnostic Studies: [place] EKG SITE RITE  Result Date: 12/03/2022 If Site Rite image not attached, placement could not be confirmed due to current cardiac rhythm.  DG Foot Complete Left  Result Date: 11/30/2022 CLINICAL DATA:  Status post left foot surgery EXAM: LEFT FOOT - COMPLETE 3+ VIEW COMPARISON:  Left foot x-ray 11/29/2022 FINDINGS: There has been interval surgical removal of the proximal aspect of the fifth metatarsal. There is overlying skin staples and wound VAC present. There is no acute fracture or dislocation. There is soft tissue swelling of the anterior foot. IMPRESSION: Interval surgical removal of the proximal aspect of the fifth metatarsal with overlying skin staples and wound VAC in place. Electronically Signed   By: Ronney Asters M.D.   On: 11/30/2022 20:50   DG Foot Complete Left  Result Date: 11/29/2022 CLINICAL DATA:  Osteomyelitis EXAM: LEFT FOOT - COMPLETE 3+ VIEW COMPARISON:  Left foot x-ray 11/07/2022. FINDINGS: Skin staples and overlying drain have been removed in the interval. Again seen is lucency and fragmentation  of the base of the fifth metatarsal compatible with patient's known osteomyelitis. Alignment appears stable in near anatomic. There is no evidence for dislocation. There is soft tissue swelling of the mid and anterior foot diffusely. IMPRESSION: Lucency and fragmentation of the base of the fifth metatarsal compatible with patient's known osteomyelitis. Alignment appears stable. Electronically Signed   By: Ronney Asters M.D.   On: 11/29/2022 21:42   PERIPHERAL VASCULAR CATHETERIZATION  Result Date: 11/29/2022 Images from the original result were not included. Patient name: Joshua [name] MRN: [id number] DOB: 06/07/1956 Sex: male 11/29/2022 Pre-operative Diagnosis: Chronic left lower extremity limb threatening ischemia with large ulceration Post-operative diagnosis:  Same Surgeon:  Eda Paschal. Donzetta Matters, MD Procedure Performed: 1.  Ultrasound-guided cannulation right common femoral artery 2.  CO2 aortogram 3.  Selection of left common femoral artery and selection of left popliteal artery at the left lower extremity with left lower extremity angiogram 4.  Mynx device closure right common femoral artery 5.  Moderate sedation with fentanyl and Versed for 26 minutes Indications: 66 year old male with diabetes and large ulceration on his foot for the past 6 weeks now with normal ABIs but monophasic tibial vessels bilaterally indicated for angiography with possible intervention. Findings: The aorta and iliac segments are free of flow-limiting stenosis.  Left common femoral and SFA are patent with brisk flow using CO2.  Contrast below the knee demonstrates an occluded anterior tibial artery just after the takeoff that does not reconstitute with very large posterior tibial artery which is the dominant runoff to the foot and peroneal artery to the level of the ankle. Patient will need diligent wound care and remains high risk for below-knee amputation.  Procedure:  The patient was identified in the holding area and taken to  room 8.  The patient was then placed supine on the table and prepped and draped in the usual sterile fashion.  Juliyah Mergen time out was called.  Ultrasound was used to evaluate the right common femoral artery which was noted be patent and compressible.  The area was anesthetized 1% lidocaine and cannulated with micropuncture needle followed by wire and sheath.  And images saved department record.  Concomitantly we administered fentanyl and Versed and his vital signs were monitored through by bedside nursing throughout the case.  We placed Divon Krabill Bentson wire followed by 5 [place] sheath and then placed an Omni catheter to the level of L1 and CO2 aortogram was performed.  We crossed the bifurcation the level of the common femoral artery perform CO2 left lower extremity angiography and then to the level of the knee we placed Naviyah Schaffert straight catheter over the Glidewire and performed contrasted angiography below the knee.  With  the above findings we elected no intervention.  Catheter was removed with Lamaj Metoyer wire.  Mynx device was deployed without complication. Contrast: 20cc Brandon C. Donzetta Matters, MD [facility] Office: [phone] Pager: [phone]  VAS [place] LOWER EXTREMITY ARTERIAL DUPLEX  Result Date: 11/26/2022 LOWER EXTREMITY ARTERIAL DUPLEX STUDY Patient Name:  AMAAN MEYER Geck  Date of Exam:   11/26/2022 Medical Rec #: [id number]         Accession #:    [id number] Date of Birth: 1956-08-31         Patient Gender: M Patient Age:   50 years Exam Location:  [facility] Procedure:      VAS [place] LOWER EXTREMITY ARTERIAL DUPLEX Referring Phys: Dagoberto Ligas --------------------------------------------------------------------------------  Indications: Peripheral artery disease, and Non healing left foot wound and              right transmetatarsal amputation. High Risk Factors: Hypertension, hyperlipidemia, Diabetes, coronary artery                    disease.  Current ABI: 11/01/22 - Rt 1.28, lt 1.1  Comparison Study: No priors. Performing Technologist: Oda Cogan RDMS, RVT  Examination Guidelines: Iyanla Eilers complete evaluation includes B-mode imaging, spectral Doppler, color Doppler, and power Doppler as needed of all accessible portions of each vessel. Bilateral testing is considered an integral part of Linzi Ohlinger complete examination. Limited examinations for reoccurring indications may be performed as noted.   +-----------+--------+-----+---------------+----------+----------------+ LEFT       PSV cm/sRatioStenosis       Waveform  Comments         +-----------+--------+-----+---------------+----------+----------------+ CFA Prox   118                         triphasic                  +-----------+--------+-----+---------------+----------+----------------+ DFA        62                          biphasic                   +-----------+--------+-----+---------------+----------+----------------+ SFA Prox   147          30-49% stenosisbiphasic                   +-----------+--------+-----+---------------+----------+----------------+ SFA Mid    194          30-49% stenosisbiphasic                   +-----------+--------+-----+---------------+----------+----------------+ SFA Distal 73                                                     +-----------+--------+-----+---------------+----------+----------------+ POP Prox   91                                                     +-----------+--------+-----+---------------+----------+----------------+ POP Distal 93                                                     +-----------+--------+-----+---------------+----------+----------------+  ATA Prox   28                          biphasic                   +-----------+--------+-----+---------------+----------+----------------+ ATA Distal 0                                     appears occluded +-----------+--------+-----+---------------+----------+----------------+ PTA  Prox   82                          monophasic                 +-----------+--------+-----+---------------+----------+----------------+ PTA Mid    183          30-49% stenosismonophasic                 +-----------+--------+-----+---------------+----------+----------------+ PTA Distal 145                         monophasic                 +-----------+--------+-----+---------------+----------+----------------+ PERO Prox  72                          monophasic                 +-----------+--------+-----+---------------+----------+----------------+ PERO Mid   62                                                     +-----------+--------+-----+---------------+----------+----------------+ PERO Distal93                                                     +-----------+--------+-----+---------------+----------+----------------+  Summary: Left: 30-49% stenosis noted in the superficial femoral artery. 30-49% stenosis noted in the posterior tibial artery. Total occlusion of the distal anterior tibial artery.  See table(s) above for measurements and observations. Electronically signed by Jamelle Haring on 11/26/2022 at 5:59:53 PM.    Final    DG Foot Complete Left  Result Date: 11/08/2022 Please see detailed radiograph report in office note.   Microbiology: Recent Results (from the past 240 hour(s))  Surgical pcr screen     Status: None   Collection Time: 11/30/22  7:40 AM   Specimen: Nasal Mucosa; Nasal Swab  Result Value Ref Range Status   MRSA, PCR NEGATIVE NEGATIVE Final   Staphylococcus aureus NEGATIVE NEGATIVE Final    Comment: (NOTE) The Xpert SA Assay (FDA approved for NASAL specimens in patients 7 years of age and older), is one component of Dajon Lazar comprehensive surveillance program. It is not intended to diagnose infection nor to guide or monitor treatment. Performed at [facility], [place] [address], [place], [place] [address]   Fungus Culture With Stain      Status: None (Preliminary result)   Collection Time: 11/30/22  4:43 PM   Specimen: PATH Other; Tissue  Result Value Ref Range Status   Fungus Stain Final report  Final    Comment: (NOTE) Performed At: [facility] [place], [place] [id number] Rush Farmer MD LX:[id number]    Fungus (Mycology) Culture PENDING  Incomplete   Fungal Source BONE  Final    Comment: 5TH METATARSAL Performed at [facility], [place] [address], [place], [place] [address]   Aerobic/Anaerobic Culture w Gram Stain (surgical/deep wound)     Status: None   Collection Time: 11/30/22  4:43 PM   Specimen: PATH Other; Tissue  Result Value Ref Range Status   Specimen Description BONE  Final   Special Requests 5TH METATARSAL  Final   Gram Stain NO WBC SEEN NO ORGANISMS SEEN   Final   Culture   Final    RARE PSEUDOMONAS AERUGINOSA CRITICAL RESULT CALLED TO, READ BACK BY AND VERIFIED WITH: RN [place]  [phone] FCP NO ANAEROBES ISOLATED Performed at [facility], [place] [address] [place], [place] [address]    Report Status 12/05/2022 FINAL  Final   Organism ID, Bacteria PSEUDOMONAS AERUGINOSA  Final      Susceptibility   Pseudomonas aeruginosa - MIC*    CEFTAZIDIME 4 SENSITIVE Sensitive     CIPROFLOXACIN <=0.25 SENSITIVE Sensitive     GENTAMICIN <=1 SENSITIVE Sensitive     IMIPENEM 1 SENSITIVE Sensitive     PIP/TAZO 16 SENSITIVE Sensitive     CEFEPIME 2 SENSITIVE Sensitive     * RARE PSEUDOMONAS AERUGINOSA  Fungus Culture Result     Status: None   Collection Time: 11/30/22  4:43 PM  Result Value Ref Range Status   Result 1 Comment  Final    Comment: (NOTE) KOH/Calcofluor preparation:  no fungus observed. Performed At: [facility] [place], [place] [id number] Rush Farmer MD GQ:[id number]   Fungus Culture With Stain     Status: None (Preliminary result)   Collection Time: 11/30/22  4:45 PM   Specimen: PATH Other; Tissue  Result Value Ref  Range Status   Fungus Stain Final report  Final    Comment: (NOTE) Performed At: [facility] [place], [place] [id number] Rush Farmer MD PH:[id number]    Fungus (Mycology) Culture PENDING  Incomplete   Fungal Source BONE  Final    Comment: CUBOID  Performed at [facility], [place] [address] [place], [place] [address]   Aerobic/Anaerobic Culture w Gram Stain (surgical/deep wound)     Status: None   Collection Time: 11/30/22  4:45 PM   Specimen: PATH Other; Tissue  Result Value Ref Range Status   Specimen Description BONE  Final   Special Requests CUBOID  Final   Gram Stain NO WBC SEEN NO ORGANISMS SEEN   Final   Culture   Final    No growth aerobically or anaerobically. Performed at [facility], [place] [address], [place], [place] [address]    Report Status 12/05/2022 FINAL  Final  Fungus Culture Result     Status: None   Collection Time: 11/30/22  4:45 PM  Result Value Ref Range Status   Result 1 Comment  Final    Comment: (NOTE) KOH/Calcofluor preparation:  no fungus observed. Performed At: [facility] [place], [place] [id number] Rush Farmer MD BE:[id number]      Labs: Basic Metabolic Panel: Recent Labs  Lab 12/04/22 0825 12/05/22 1853  NA 136 138  K 3.8 4.0  CL 108 106  CO2 24 25  GLUCOSE 96 127*  BUN 22 25*  CREATININE 1.37* 1.47*  CALCIUM 8.7* 8.9   Liver Function Tests: No results for input(s): "AST", "ALT", "ALKPHOS", "BILITOT", "PROT", "ALBUMIN" in the last 168 hours. No results for input(s): "LIPASE", "AMYLASE" in the last 168 hours. No results for input(s): "AMMONIA" in the last 168 hours. CBC: Recent Labs  Lab 12/04/22 0825 12/05/22 1853  WBC 5.6  --   HGB 10.6* 11.0*  HCT 33.6* 33.7*  MCV 89.1  --   PLT 187  --    Cardiac Enzymes: No results for input(s): "CKTOTAL", "CKMB", "CKMBINDEX", "TROPONINI" in the last 168 hours. BNP: BNP (last 3 results) No results for input(s):  "BNP" in the last 8760 hours.  ProBNP (last 3 results) No results for input(s): "PROBNP" in the last 8760 hours.  CBG: Recent Labs  Lab 12/07/22 2135 12/08/22 0808 12/08/22 0835 12/08/22 0905 12/08/22 1156  GLUCAP 112* 59* 60* 116* 95       Signed:  Fayrene Helper MD.  [facility] 12/08/2022, 2:06 PM

## 2022-12-08 NOTE — Progress Notes (Signed)
Hypoglycemic Event 0835 am  CBG: 60   Treatment: 4 oz juice/soda  Symptoms: None  Follow-up CBG: Time:0905  CBG Result:116  Possible Reasons for Event: Inadequate meal intake  Comments/MD notified: Florene Glen, MD    Dorene Grebe

## 2022-12-08 NOTE — Plan of Care (Signed)

## 2022-12-08 NOTE — Progress Notes (Signed)
Hypoglycemic Event  CBG: 64  Treatment: 4 oz juice/soda  Symptoms: None  Follow-up CBG: Time: 2200 CBG Result: 86   Possible Reasons for Event: Inadequate meal intake  Comments/MD notified: Aileen Fass, DO     Kaylyn Lim, BSN, PCCN

## 2022-12-08 NOTE — Plan of Care (Signed)
  Problem: Education: Goal: Knowledge of General Education information will improve Description: Including pain rating scale, medication(s)/side effects and non-pharmacologic comfort measures Outcome: Progressing   Problem: Health Behavior/Discharge Planning: Goal: Ability to manage health-related needs will improve Outcome: Progressing   Problem: Clinical Measurements: Goal: Cardiovascular complication will be avoided Outcome: Progressing   Problem: Pain Managment: Goal: General experience of comfort will improve Outcome: Progressing   Problem: Safety: Goal: Ability to remain free from injury will improve Outcome: Progressing   Problem: Skin Integrity: Goal: Risk for impaired skin integrity will decrease Outcome: Progressing

## 2022-12-08 NOTE — Progress Notes (Signed)
EEG complete - results pending 

## 2022-12-08 NOTE — Inpatient Diabetes Management (Signed)
Inpatient Diabetes Program Recommendations  AACE/ADA: New Consensus Statement on Inpatient Glycemic Control (2015)  Target Ranges:  Prepandial:   less than 140 mg/dL      Peak postprandial:   less than 180 mg/dL (1-2 hours)      Critically ill patients:  140 - 180 mg/dL   Lab Results  Component Value Date   GLUCAP 116 (H) 12/08/2022   HGBA1C 8.4 (H) 12/01/2022    Latest Reference Range & Units 12/06/22 13:17 12/06/22 21:35 12/07/22 07:45 12/07/22 11:55 12/07/22 16:07 12/07/22 21:35 12/08/22 08:08 12/08/22 08:35 12/08/22 09:05  Glucose-Capillary 70 - 99 mg/dL 145 (H) 142 (H) 89 133 (H) 71 112 (H) 59 (L) 60 (L) 116 (H)  (H): Data is abnormally high (L): Data is abnormally low  Inpatient Diabetes Program Recommendations:   Patient had hypoglycemia 59 this am. Please consider: -Decrease Novolog correction to 0-9 units tid, 0-5 units hs -Decrease Semglee hs to 8 units  Thank you, Bethena Roys E. Decoda Van, RN, MSN, CDE  Diabetes Coordinator Inpatient Glycemic Control Team Team Pager [phone] (8am-5pm) 12/08/2022 9:18 AM

## 2022-12-08 NOTE — TOC Progression Note (Addendum)
Transition of Care [facility]) - Progression Note    Patient Details  Name: CANDICE LUNNEY MRN: [id number] Date of Birth: 08-Oct-1956  Transition of Care [facility]) CM/SW [place], [place] Phone Number: 12/08/2022, 12:12 PM  Clinical Narrative:     CSW spoke with [facility] and confirmed their bed offer. PH to order wound vac for pt. Authorization approved 12/27- 12/29. [id number]. Pt to DC with IV ABX. Will follow up with facility on when they can accept pt. Pt is medically ready. Per pt, he is seeing if his son can transport him. If not he feels like SAFE transport should be ok with some assistance. TOC will continue to follow for DC needs.   Expected Discharge Plan: [place] Barriers to Discharge: Insurance Authorization, Other (must enter comment) (SNF needs to order wound vac and cpap)  Expected Discharge Plan and Services In-house Referral: Clinical Social Work Discharge Planning Services: CM Consult   Living arrangements for the past 2 months: Hotel/Motel (extended stay-)                           HH Arranged: RN, Disease Management, PT, OT HH Agency: [place] Date [place]: 11/30/22 Time [place]: 1528 Representative spoke with at [place]: [place] ([place]) Interventions [place]: Food Insecurity Present (11/30/2022)  Housing: High Risk (11/26/2022)  Transportation Needs: Unmet Transportation Needs (11/26/2022)  Utilities: Not At Risk (11/26/2022)  Depression (PHQ2-9): High Risk (08/26/2022)  Tobacco Use: Low Risk  (12/01/2022)    Readmission Risk Interventions    11/08/2022    2:21 PM  Readmission Risk Prevention Plan  Transportation Screening Complete  PCP or Specialist Appt within 5-7 Days Complete  Home Care Screening Complete  Medication Review (RN CM) Complete

## 2022-12-08 NOTE — Care Management Important Message (Signed)
Important Message  Patient Details  Name: Joshua [name] MRN: [id number] Date of Birth: 15-Apr-1956   Medicare Important Message Given:  Yes     [name] 12/08/2022, 10:46 AM

## 2022-12-08 NOTE — Procedures (Signed)
Patient Name: Joshua [name]  MRN: [id number]  Epilepsy Attending: Lora Havens  Referring Physician/Provider: Elodia Florence., MD  Date: 12/08/2022 Duration: 22.20 mmins  Patient history: 66yo M with ams. EEG to evaluate for seizure.  Level of alertness: Awake, asleep  AEDs during EEG study: None  Technical aspects: This EEG study was done with scalp electrodes positioned according to the 10-20 International system of electrode placement. Electrical activity was reviewed with band pass filter of 1-$RemoveBef'70Hz'hjazwNzYON$ , sensitivity of 7 uV/mm, display speed of 17mm/sec with a $Remo'60Hz'EKsew$  notched filter applied as appropriate. EEG data were recorded continuously and digitally stored.  Video monitoring was available and reviewed as appropriate.  Description: The posterior dominant rhythm consists of 9 Hz activity of moderate voltage (25-35 uV) seen predominantly in posterior head regions, symmetric and reactive to eye opening and eye closing. Sleep was characterized by vertex waves, sleep spindles (12 to 14 Hz), maximal frontocentral region.  Physiologic photic driving was not seen during photic stimulation.  Hyperventilation was not performed.     IMPRESSION: This study is within normal limits. No seizures or epileptiform discharges were seen throughout the recording.  Joshua [name]

## 2022-12-09 ENCOUNTER — Other Ambulatory Visit: Payer: Self-pay | Admitting: Cardiology

## 2022-12-09 ENCOUNTER — Inpatient Hospital Stay: Payer: Medicare Other

## 2022-12-09 ENCOUNTER — Inpatient Hospital Stay (HOSPITAL_COMMUNITY): Payer: Medicare Other

## 2022-12-09 DIAGNOSIS — E1121 Type 2 diabetes mellitus with diabetic nephropathy: Secondary | ICD-10-CM | POA: Diagnosis not present

## 2022-12-09 DIAGNOSIS — I129 Hypertensive chronic kidney disease with stage 1 through stage 4 chronic kidney disease, or unspecified chronic kidney disease: Secondary | ICD-10-CM | POA: Diagnosis not present

## 2022-12-09 DIAGNOSIS — N184 Chronic kidney disease, stage 4 (severe): Secondary | ICD-10-CM | POA: Diagnosis not present

## 2022-12-09 DIAGNOSIS — Z4889 Encounter for other specified surgical aftercare: Secondary | ICD-10-CM | POA: Diagnosis not present

## 2022-12-09 DIAGNOSIS — S91302A Unspecified open wound, left foot, initial encounter: Secondary | ICD-10-CM | POA: Diagnosis not present

## 2022-12-09 DIAGNOSIS — I2510 Atherosclerotic heart disease of native coronary artery without angina pectoris: Secondary | ICD-10-CM | POA: Diagnosis not present

## 2022-12-09 DIAGNOSIS — J449 Chronic obstructive pulmonary disease, unspecified: Secondary | ICD-10-CM | POA: Diagnosis not present

## 2022-12-09 DIAGNOSIS — E11628 Type 2 diabetes mellitus with other skin complications: Secondary | ICD-10-CM | POA: Diagnosis not present

## 2022-12-09 DIAGNOSIS — I10 Essential (primary) hypertension: Secondary | ICD-10-CM | POA: Diagnosis not present

## 2022-12-09 DIAGNOSIS — M86372 Chronic multifocal osteomyelitis, left ankle and foot: Secondary | ICD-10-CM | POA: Diagnosis not present

## 2022-12-09 DIAGNOSIS — M869 Osteomyelitis, unspecified: Secondary | ICD-10-CM | POA: Diagnosis not present

## 2022-12-09 DIAGNOSIS — R55 Syncope and collapse: Secondary | ICD-10-CM | POA: Diagnosis not present

## 2022-12-09 DIAGNOSIS — Z23 Encounter for immunization: Secondary | ICD-10-CM | POA: Diagnosis not present

## 2022-12-09 DIAGNOSIS — E119 Type 2 diabetes mellitus without complications: Secondary | ICD-10-CM | POA: Diagnosis not present

## 2022-12-09 DIAGNOSIS — I252 Old myocardial infarction: Secondary | ICD-10-CM | POA: Diagnosis not present

## 2022-12-09 DIAGNOSIS — L089 Local infection of the skin and subcutaneous tissue, unspecified: Secondary | ICD-10-CM | POA: Diagnosis not present

## 2022-12-09 DIAGNOSIS — M79672 Pain in left foot: Secondary | ICD-10-CM | POA: Diagnosis not present

## 2022-12-09 DIAGNOSIS — N183 Chronic kidney disease, stage 3 unspecified: Secondary | ICD-10-CM | POA: Diagnosis not present

## 2022-12-09 DIAGNOSIS — H40053 Ocular hypertension, bilateral: Secondary | ICD-10-CM | POA: Diagnosis not present

## 2022-12-09 DIAGNOSIS — D518 Other vitamin B12 deficiency anemias: Secondary | ICD-10-CM | POA: Diagnosis not present

## 2022-12-09 DIAGNOSIS — E1165 Type 2 diabetes mellitus with hyperglycemia: Secondary | ICD-10-CM | POA: Diagnosis not present

## 2022-12-09 DIAGNOSIS — R5381 Other malaise: Secondary | ICD-10-CM | POA: Diagnosis not present

## 2022-12-09 DIAGNOSIS — Z743 Need for continuous supervision: Secondary | ICD-10-CM | POA: Diagnosis not present

## 2022-12-09 DIAGNOSIS — R509 Fever, unspecified: Secondary | ICD-10-CM | POA: Diagnosis not present

## 2022-12-09 DIAGNOSIS — R531 Weakness: Secondary | ICD-10-CM | POA: Diagnosis not present

## 2022-12-09 DIAGNOSIS — G629 Polyneuropathy, unspecified: Secondary | ICD-10-CM | POA: Diagnosis not present

## 2022-12-09 DIAGNOSIS — M109 Gout, unspecified: Secondary | ICD-10-CM | POA: Diagnosis not present

## 2022-12-09 DIAGNOSIS — I5042 Chronic combined systolic (congestive) and diastolic (congestive) heart failure: Secondary | ICD-10-CM | POA: Diagnosis not present

## 2022-12-09 DIAGNOSIS — E7800 Pure hypercholesterolemia, unspecified: Secondary | ICD-10-CM | POA: Diagnosis not present

## 2022-12-09 DIAGNOSIS — Z7401 Bed confinement status: Secondary | ICD-10-CM | POA: Diagnosis not present

## 2022-12-09 DIAGNOSIS — Z961 Presence of intraocular lens: Secondary | ICD-10-CM | POA: Diagnosis not present

## 2022-12-09 DIAGNOSIS — Z794 Long term (current) use of insulin: Secondary | ICD-10-CM | POA: Diagnosis not present

## 2022-12-09 DIAGNOSIS — R51 Headache with orthostatic component, not elsewhere classified: Secondary | ICD-10-CM | POA: Diagnosis not present

## 2022-12-09 DIAGNOSIS — M6281 Muscle weakness (generalized): Secondary | ICD-10-CM | POA: Diagnosis not present

## 2022-12-09 DIAGNOSIS — E559 Vitamin D deficiency, unspecified: Secondary | ICD-10-CM | POA: Diagnosis not present

## 2022-12-09 DIAGNOSIS — Z89421 Acquired absence of other right toe(s): Secondary | ICD-10-CM | POA: Diagnosis not present

## 2022-12-09 DIAGNOSIS — E039 Hypothyroidism, unspecified: Secondary | ICD-10-CM | POA: Diagnosis not present

## 2022-12-09 DIAGNOSIS — I5030 Unspecified diastolic (congestive) heart failure: Secondary | ICD-10-CM | POA: Diagnosis not present

## 2022-12-09 DIAGNOSIS — M86172 Other acute osteomyelitis, left ankle and foot: Secondary | ICD-10-CM | POA: Diagnosis not present

## 2022-12-09 DIAGNOSIS — E113493 Type 2 diabetes mellitus with severe nonproliferative diabetic retinopathy without macular edema, bilateral: Secondary | ICD-10-CM | POA: Diagnosis not present

## 2022-12-09 DIAGNOSIS — E11621 Type 2 diabetes mellitus with foot ulcer: Secondary | ICD-10-CM | POA: Diagnosis not present

## 2022-12-09 DIAGNOSIS — D631 Anemia in chronic kidney disease: Secondary | ICD-10-CM | POA: Diagnosis not present

## 2022-12-09 DIAGNOSIS — N1832 Chronic kidney disease, stage 3b: Secondary | ICD-10-CM | POA: Diagnosis not present

## 2022-12-09 DIAGNOSIS — R2681 Unsteadiness on feet: Secondary | ICD-10-CM | POA: Diagnosis not present

## 2022-12-09 DIAGNOSIS — L97529 Non-pressure chronic ulcer of other part of left foot with unspecified severity: Secondary | ICD-10-CM | POA: Diagnosis not present

## 2022-12-09 DIAGNOSIS — S80811A Abrasion, right lower leg, initial encounter: Secondary | ICD-10-CM | POA: Diagnosis not present

## 2022-12-09 DIAGNOSIS — L97522 Non-pressure chronic ulcer of other part of left foot with fat layer exposed: Secondary | ICD-10-CM | POA: Diagnosis not present

## 2022-12-09 DIAGNOSIS — I25119 Atherosclerotic heart disease of native coronary artery with unspecified angina pectoris: Secondary | ICD-10-CM | POA: Diagnosis not present

## 2022-12-09 DIAGNOSIS — S81802A Unspecified open wound, left lower leg, initial encounter: Secondary | ICD-10-CM | POA: Diagnosis not present

## 2022-12-09 DIAGNOSIS — E1159 Type 2 diabetes mellitus with other circulatory complications: Secondary | ICD-10-CM | POA: Diagnosis not present

## 2022-12-09 DIAGNOSIS — N2581 Secondary hyperparathyroidism of renal origin: Secondary | ICD-10-CM | POA: Diagnosis not present

## 2022-12-09 DIAGNOSIS — G4733 Obstructive sleep apnea (adult) (pediatric): Secondary | ICD-10-CM | POA: Diagnosis not present

## 2022-12-09 DIAGNOSIS — L98499 Non-pressure chronic ulcer of skin of other sites with unspecified severity: Secondary | ICD-10-CM | POA: Diagnosis not present

## 2022-12-09 DIAGNOSIS — K219 Gastro-esophageal reflux disease without esophagitis: Secondary | ICD-10-CM | POA: Diagnosis not present

## 2022-12-09 DIAGNOSIS — M79673 Pain in unspecified foot: Secondary | ICD-10-CM | POA: Diagnosis not present

## 2022-12-09 DIAGNOSIS — E1142 Type 2 diabetes mellitus with diabetic polyneuropathy: Secondary | ICD-10-CM | POA: Diagnosis not present

## 2022-12-09 DIAGNOSIS — I5032 Chronic diastolic (congestive) heart failure: Secondary | ICD-10-CM | POA: Diagnosis not present

## 2022-12-09 DIAGNOSIS — I5022 Chronic systolic (congestive) heart failure: Secondary | ICD-10-CM | POA: Diagnosis not present

## 2022-12-09 DIAGNOSIS — K029 Dental caries, unspecified: Secondary | ICD-10-CM | POA: Diagnosis not present

## 2022-12-09 DIAGNOSIS — S80811D Abrasion, right lower leg, subsequent encounter: Secondary | ICD-10-CM | POA: Diagnosis not present

## 2022-12-09 DIAGNOSIS — G4730 Sleep apnea, unspecified: Secondary | ICD-10-CM | POA: Diagnosis not present

## 2022-12-09 DIAGNOSIS — R239 Unspecified skin changes: Secondary | ICD-10-CM | POA: Diagnosis not present

## 2022-12-09 DIAGNOSIS — R1310 Dysphagia, unspecified: Secondary | ICD-10-CM | POA: Diagnosis not present

## 2022-12-09 DIAGNOSIS — E785 Hyperlipidemia, unspecified: Secondary | ICD-10-CM | POA: Diagnosis not present

## 2022-12-09 LAB — ECHOCARDIOGRAM COMPLETE
Area-P 1/2: 3.33 cm2
Height: 69 in
MV VTI: 2.75 cm2
S' Lateral: 2.9 cm
Weight: 4395.09 oz

## 2022-12-09 LAB — GLUCOSE, CAPILLARY
Glucose-Capillary: 156 mg/dL — ABNORMAL HIGH (ref 70–99)
Glucose-Capillary: 135 mg/dL — ABNORMAL HIGH (ref 70–99)
Glucose-Capillary: 150 mg/dL — ABNORMAL HIGH (ref 70–99)

## 2022-12-09 LAB — BASIC METABOLIC PANEL
Anion gap: 5 (ref 5–15)
BUN: 38 mg/dL — ABNORMAL HIGH (ref 8–23)
CO2: 24 mmol/L (ref 22–32)
Calcium: 8.4 mg/dL — ABNORMAL LOW (ref 8.9–10.3)
Chloride: 108 mmol/L (ref 98–111)
Creatinine, Ser: 2.04 mg/dL — ABNORMAL HIGH (ref 0.61–1.24)
GFR, Estimated: 36 mL/min — ABNORMAL LOW (ref >60)
Glucose, Bld: 131 mg/dL — ABNORMAL HIGH (ref 70–99)
Potassium: 3.4 mmol/L — ABNORMAL LOW (ref 3.5–5.1)
Sodium: 137 mmol/L (ref 135–145)

## 2022-12-09 LAB — CBC
HCT: 30.3 % — ABNORMAL LOW (ref 39.0–52.0)
Hemoglobin: 9.7 g/dL — ABNORMAL LOW (ref 13.0–17.0)
MCH: 28.2 pg (ref 26.0–34.0)
MCHC: 32 g/dL (ref 30.0–36.0)
MCV: 88.1 fL (ref 80.0–100.0)
Platelets: 131 10*3/uL — ABNORMAL LOW (ref 150–400)
RBC: 3.44 MIL/uL — ABNORMAL LOW (ref 4.22–5.81)
RDW: 18.2 % — ABNORMAL HIGH (ref 11.5–15.5)
WBC: 6.5 10*3/uL (ref 4.0–10.5)
nRBC: 0 % (ref 0.0–0.2)

## 2022-12-09 LAB — MAGNESIUM: Magnesium: 2.1 mg/dL (ref 1.7–2.4)

## 2022-12-09 MED ORDER — INSULIN ASPART 100 UNIT/ML IJ SOLN: 3.0000 [IU] | Freq: Three times a day (TID) | INTRAMUSCULAR | 11 refills | Status: DC

## 2022-12-09 MED ORDER — OXYCODONE-ACETAMINOPHEN 5-325 MG PO TABS: 1.0000 | ORAL_TABLET | Freq: Four times a day (QID) | ORAL | 0 refills | Status: AC | PRN

## 2022-12-09 MED ORDER — SODIUM CHLORIDE 0.9 % IV SOLN: 2.0000 g | Freq: Two times a day (BID) | INTRAVENOUS | Status: DC

## 2022-12-09 MED ORDER — INSULIN ASPART 100 UNIT/ML IJ SOLN
3.0000 [IU] | Freq: Three times a day (TID) | INTRAMUSCULAR | Status: DC
  Administered 2022-12-09 (×2): 3 [IU] via SUBCUTANEOUS

## 2022-12-09 MED ORDER — CEFEPIME IV (FOR PTA / DISCHARGE USE ONLY): 2.0000 g | Freq: Two times a day (BID) | INTRAVENOUS | 0 refills | Status: AC

## 2022-12-09 MED ORDER — INSULIN GLARGINE-YFGN 100 UNIT/ML ~~LOC~~ SOLN
15.0000 [IU] | Freq: Two times a day (BID) | SUBCUTANEOUS | Status: DC
  Administered 2022-12-09: 15 [IU] via SUBCUTANEOUS
  Filled 2022-12-09 (×2): qty 0.15

## 2022-12-09 NOTE — Progress Notes (Signed)
14 day monitor ordered at the request of Dr. Florene Glen for evaluation of syncope. To be read by Dr. Claiborne Billings (primary cardiologist)

## 2022-12-09 NOTE — TOC Transition Note (Signed)
Transition of Care [facility]) - CM/SW Discharge Note   Patient Details  Name: Joshua [name] MRN: [id number] Date of Birth: [birth year]  Transition of [facility]) CM/SW Contact:  Amador Cunas, [place] Phone Number: 12/09/2022, 4:14 PM   Clinical Narrative:   Pt for dc to [facility] today. Spoke to [place] at [name] who confirmed they are prepared to admit pt today. Pt aware of dc and reports agreeable. RN provided with number for report and PTAR arranged for transport. SW signing off at dc.   Wandra Feinstein, MSW, LCSW [phone] (coverage)      Final next level of care: Skilled Nursing Facility Barriers to Discharge: No Barriers Identified   Patient Goals and CMS Choice   Choice offered to / list presented to : Patient  Discharge Placement                Patient chooses bed at: Other - please specify in the comment section below: [facility]) Patient to be transferred to facility by: [place] Name of family member notified: Pt to update family Patient and family notified of of transfer: 12/09/22  Discharge Plan and Services Additional resources added to the After Visit Summary for   In-house Referral: Clinical Social Work Discharge Planning Services: CM Consult                      HH Arranged: RN, Disease Management, PT, OT HH Agency: [place] Date [place]: 11/30/22 Time [place] Agency Contacted: 1528 Representative spoke with at [place]: [place] ([place]) Interventions [place]: Food Insecurity Present (11/30/2022)  Housing: High Risk (11/26/2022)  Transportation Needs: Unmet Transportation Needs (11/26/2022)  Utilities: Not At Risk (11/26/2022)  Depression (PHQ2-9): High Risk (08/26/2022)  Tobacco Use: Low Risk  (12/01/2022)     Readmission Risk Interventions    11/08/2022    2:21 PM  Readmission Risk Prevention Plan  Transportation Screening Complete   PCP or Specialist Appt within 5-7 Days Complete  Home Care Screening Complete  Medication Review (RN CM) Complete

## 2022-12-09 NOTE — Progress Notes (Signed)
Spoke to [employer] with [facility] who confirmed pt's wound vac and CPAP have been delivered to facility. Per MD, plan for dc pending ECHO report. SW will assist as indicated.   Wandra Feinstein, MSW, LCSW [phone] (coverage)

## 2022-12-09 NOTE — Progress Notes (Signed)
Wound vac removed, wet to dry dressing placed to left foot.  Patient notified of transfer to [facility].  Patient to transfer with PICC line in place. Report called to [place]. Turner RN.

## 2022-12-09 NOTE — Progress Notes (Signed)
  Echocardiogram 2D Echocardiogram has been performed.  Wynelle Link 12/09/2022, 2:53 PM

## 2022-12-09 NOTE — Progress Notes (Unsigned)
Enrolled for [employer] to mail a ZIO XT long term holter monitor to the patients address on file.  

## 2022-12-09 NOTE — Progress Notes (Signed)
PHARMACY NOTE:  ANTIMICROBIAL RENAL DOSAGE ADJUSTMENT  Current antimicrobial regimen includes a mismatch between antimicrobial dosage and estimated renal function.  As per policy approved by the Pharmacy & Therapeutics and Medical Executive Committees, the antimicrobial dosage will be adjusted accordingly.  Current antimicrobial dosage:  cefepime 2g IV q8h  Indication: Pseudomonas in wound  Renal Function:  Estimated Creatinine Clearance: 47.8 mL/min (A) (by C-G formula based on SCr of 2.04 mg/dL (H)). $RemoveBef'[]'fTulPTdyJA$      On intermittent HD, scheduled: $RemoveBefore'[]'eVcJzaluXPrbh$      On CRRT    Antimicrobial dosage has been changed to:  cefepime 2g IV q12h  Additional comments:   Thank you for allowing pharmacy to be a part of this patient's care.  Einar Grad, [facility] 12/09/2022 9:40 AM

## 2022-12-09 NOTE — Discharge Summary (Signed)
Physician Discharge Summary  GOHAN COLLISTER XAJ:[id number] DOB: [birth year] DOA: 11/25/2022  PCP: [name], MD  Admit date: 11/25/2022 Discharge date: 12/09/2022  Time spent: 40 minutes  Recommendations for Outpatient Follow-up:  Follow outpatient CBC/CMP  Follow with podiatry outpatient - continue nonweightbearing, wound vac changes per podiatry Follow with ID as scheduled Insulin dose reduced at discharge - follow outpatient and adjust as needed based on blood sugars Follow lower urinary tract symptoms - started on flomax Follow hematuria - repeat UA with resolved hematuria  Follow creatinine outpatient, fluctuating at discharge - consider adjusting lasix prn  Follow ziopatch outpatient per cards  Discharge Diagnoses:  Principal Problem:   Non-healing wound of left lower extremity Active Problems:   Hyperlipidemia   Acute osteomyelitis of hindfoot, left (HCC)   Chronic diastolic CHF (congestive heart failure) ([place])   Stage 3b chronic kidney disease (CKD) ([place])   DM2 (diabetes mellitus, type 2) ([place])   Essential hypertension   Preop cardiovascular exam   Cardiac chest pain   Acute osteomyelitis of metatarsal bone of left foot ([place])   Diabetic foot ulcer with osteomyelitis ([place])   Discharge Condition: stable  Diet recommendation: heart healthy, diabetic  Filed Weights   12/07/22 0603 12/08/22 0430 12/09/22 0349  Weight: 123.7 kg 123.6 kg 124.6 kg    History of present illness:  [name] is Jaylenn Altier 66 y.o. male with Ryley Bachtel history of poorly healing wound of the left lateral foot, diabetes type 2 complicated by diabetic poorly neuropathy, chronic diastolic heart failure, CKD stage IIIb, creatinine baseline 2--2.4, hypertension, hyperlipidemia who presents with nonhealing wound of the left foot after refer for admission from podiatry clinic. Podiatry recommended BKA, but the patient declined, opting for attempted limb salvage. Vascular consulted for angiogram 12/18, found  Vernette Moise large dominant left posterior tibial artery and occluded anterior tibial artery. Partial 5th MT amputation and biopsy performed with wound vac placement on 12/19. Broad IV antibiotics were continued until Pseudomonas grew in bone Cx. ID has recommended 6 weeks of IV cefepime thru PICC. The patient is medically stable for discharge to SNF.  Discharged on 12/27 with plan for outpatient ID and podiatry follow up.  Discharged delayed by syncopal episode.  Workup completed.  Now discharging 12/28.  Hospital Course:  Assessment and Plan: Syncope - witnessed syncopal episode 12/27. Unclear cause, was talking to chaplain and RN in room, then eyes rolled back and lost consciousness.  Out for about 1 min.  When came around, took about 15 seconds to get wits about him.  C/o cloudiness in head afterwards.  Orthostatics negative, EKG with first degree AV block, sinus.  EEG without seizures.  Head CT without acute findings.  Tele without concerning arrhythmia.  Echo with EF 55-60%, no RWMA.  Arlynn Mcdermid bit unclear the cause for this, will ask for cardiac monitor outpatient.   - w/u further outpatient as indicated - no driving for at least 6 months  Nonhealing diabetic foot ulcer with osteomyelitis: s/p debridement 11/25 and oral antibiotics, worsened. BKA was recommended, as attempt at limb salvage, now s/p partial resection of left 5th MT with skin substitute and wound vac placement on 12/19. - Pseudomonas growing from culture of 5th MT, changed to cefepime. Per CN:[id number] 6 weeks IV cefepime through PICC placed 12/22.   - Podiatry following, continue wound vac. Anticipated duration typically 4-12 weeks. Podiatry recommends changing out MWF - NWB LLE - follow up with ID and podiatry outpatient as scheduled   PAD s/p right  TMA 2017: Arteriogram 12/18 with occluded left anterior tibial artery without reconstitution, dominant large posterior tibial artery noted.  - Antiplatelet, statin   IDT2DM with diabetic neuropathy:  HbA1c 7.6%.  - hypoglycemia this AM - reduce basal at discharge to 15 units twice daily, continue mealtime with 3 units TID with meals.  Will discharge on sensitive SSI.  Needs further adjustment outpatient. - Continue gabapentin.    Poor SDOH profile, housing insecurity: Without addressing current homelessness, do not suspect any efforts at limb salvage will be fruitful.   - CSW consulted. Will pursue SNF placement at discharge.   Stage IIIb CKD: baseline creatinine 2.1-2.6 in November.  Had improved here lower than recent baseline.  Now fluctuating. - Avoid nephrotoxins. Had CO2 arteriogram.  - Continue lasix 40 mg BID. CrCl stable and improved. Will monitor intermittently.   CAD s/p multiple PCI, HTN, HLD: Demand myocardial ischemia noted this admission.  - Continue ASA, brilinta - Continue atorvastatin 80m. LDL is 33, HDL is 43.  - Continue metoprolol, imdur.    Hematuria: Transient problem, has not recurred.  - recent UA negative for blood - follow and w/u additionally outpatient as needed  LUTS  BPH - straining to urinate, continue flomax   GERD:  - Restarted PPI and H2B home doses/schedule   OSA:  - CPAP qHS encouraged   AOCD: Monitor CBC intermittently.   Gout: Quiescent.  allopurinol   Morbid obesity: Body mass index is 40.27 kg/m.     Procedures: 12/15 Summary:  Left: 30-49% stenosis noted in the superficial femoral artery. 30-49%  stenosis noted in the posterior tibial artery. Total occlusion of the  distal anterior tibial artery.   12/18 Procedure Performed: 1.  Ultrasound-guided cannulation right common femoral artery 2.  CO2 aortogram 3.  Selection of left common femoral artery and selection of left popliteal artery at the left lower extremity with left lower extremity angiogram  4.  Mynx device closure right common femoral artery 5.  Moderate sedation with fentanyl and Versed for 26 minutes  The aorta and iliac segments are free of flow-limiting  stenosis.  Left common femoral and SFA are patent with brisk flow using CO2.  Contrast below the knee demonstrates an occluded anterior tibial artery just after the takeoff that does not reconstitute with very large posterior tibial artery which is the dominant runoff to the foot and peroneal artery to the level of the ankle.   Patient will need diligent wound care and remains high risk for below-knee amputation.  12/19 Procedures:             1) partial resection metatarsal fifth             2) open bone biopsy of cuboid             3) preparation of wound bed for skin substitute             4) application of skin substitute             5) application negative pressure wound therapy  Consultations: Vasular Podiatry ID  Discharge Exam: Vitals:   12/09/22 0349 12/09/22 0754  BP: (!) 142/76 (!) 150/79  Pulse: 92 99  Resp: 16 20  Temp: 98.3 F (36.8 C) 98.7 F (37.1 C)  SpO2: 99% 98%   No new complaints today Discussed d/c plan  General: No acute distress. Cardiovascular: Heart sounds show Estephani Popper regular rate, and rhythm.  Lungs: Clear to auscultation bilaterally Abdomen: Soft, nontender, nondistended  Neurological: Alert and oriented 3. Moves all extremities 4 with equal strength. Cranial nerves II through XII grossly intact. Extremities: LLE with dressing in place, wound vac  Discharge Instructions   Discharge Instructions     (HEART FAILURE PATIENTS) Call MD:  Anytime you have any of the following symptoms: 1) 3 pound weight gain in 24 hours or 5 pounds in 1 week 2) shortness of breath, with or without Carol Theys dry hacking cough 3) swelling in the hands, feet or stomach 4) if you have to sleep on extra pillows at night in order to breathe.   Complete by: As directed    [facility] pharmacist to adjust dose for Vancomycin, Aminoglycosides and other anti-infective therapies as requested by physician.   Complete by: As directed    [facility] to provide Cath Flo  647m   Complete by: As directed    Administer for PICC line occlusion and as ordered by physician for other access device issues.   Anaphylaxis Kit: Provided to treat any anaphylactic reaction to the medication being provided to the patient if First Dose or when requested by physician   Complete by: As directed    Epinephrine 156mml vial / amp: Administer 0.47m61m0.47ml42mubcutaneously once for moderate to severe anaphylaxis, nurse to call physician and pharmacy when reaction occurs and call 911 if needed for immediate care   Diphenhydramine 50mg1mIV vial: Administer 25-50mg 36mM PRN for first dose reaction, rash, itching, mild reaction, nurse to call physician and pharmacy when reaction occurs   Sodium Chloride 0.9% NS 500ml I30mdminister if needed for hypovolemic blood pressure drop or as ordered by physician after call to physician with anaphylactic reaction   Call MD for:  difficulty breathing, headache or visual disturbances   Complete by: As directed    Call MD for:  extreme fatigue   Complete by: As directed    Call MD for:  hives   Complete by: As directed    Call MD for:  persistant dizziness or light-headedness   Complete by: As directed    Call MD for:  persistant nausea and vomiting   Complete by: As directed    Call MD for:  redness, tenderness, or signs of infection (pain, swelling, redness, odor or green/yellow discharge around incision site)   Complete by: As directed    Call MD for:  severe uncontrolled pain   Complete by: As directed    Call MD for:  temperature >100.4   Complete by: As directed    Change dressing on IV access line weekly and PRN   Complete by: As directed    Diet - low sodium heart healthy   Complete by: As directed    Discharge instructions   Complete by: As directed    You were seen for Tamera Pingley diabetic foot infection and osteomyelitis (bone infection).  You've been treated with Leyna Vanderkolk partial resection of the left 5th metatarsal with skin substitute and  wound vac placement by podiatry.  You'll need to be nonweightbearing to the left leg.  Continue the wound vac as recommended by podiatry.  We're sending you with 6 weeks of antibiotics to treat the bone infection.  It's extremely important that you follow up with podiatry and follow podiatry's instructions to give this wound the best chance to heal.  Your at high risk to need further amputation.  You should follow up with infectious disease outpatient.   I've adjusted your diabetes medicine since you've had low blood  sugars here.  We'll discharge you with 15 units lantus twice daily and 3 units aspart with meals.  In addition, we'll continue sliding scale insulin.  They should continue to adjust your basal and bolus regimen at the skilled nursing facility.  You had transient blood in the urine which has resolved.  You should follow this with your outpatient doctors.  You may need additional workup if this is recurrent.  You had Riyan Haile syncopal episode.  I'm not quite clear of the cause, but your workup thus far has been reassuring.  I'll plan to arrange an outpatient cardiac monitor for you.    Return for new, recurrent, or worsening issues.  Please ask your PCP to request records from this hospitalization so they know what was done and what the next steps will be.   Discharge wound care:   Complete by: As directed    Continue MWF wound vac changes per podiatry   Flush IV access with Sodium Chloride 0.9% and Heparin 10 units/ml or 100 units/ml   Complete by: As directed    Home infusion instructions - [facility]   Complete by: As directed    Instructions: Flush IV access with Sodium Chloride 0.9% and Heparin 10units/ml or 100units/ml   Change dressing on IV access line: Weekly and PRN   Instructions Cath Flo 80m: Administer for PICC Line occlusion and as ordered by physician for other access device   [facility] pharmacist to adjust dose for: Vancomycin, Aminoglycosides and  other anti-infective therapies as requested by physician   Increase activity slowly   Complete by: As directed    Method of administration may be changed at the discretion of home infusion pharmacist based upon assessment of the patient and/or caregiver's ability to self-administer the medication ordered   Complete by: As directed    Non weight bearing   Complete by: As directed    Laterality: left   Extremity: Lower      Allergies as of 12/09/2022       Reactions   Chlorthalidone Other (See Comments)   Causes dehydration, kidneys shut down        Medication List     STOP taking these medications    amoxicillin-clavulanate 875-125 MG tablet Commonly known as: AUGMENTIN   doxycycline 100 MG tablet Commonly known as: VIBRA-TABS       TAKE these medications    allopurinol 100 MG tablet Commonly known as: ZYLOPRIM Take 1 tablet (100 mg total) by mouth daily.   ascorbic acid 500 MG tablet Commonly known as: VITAMIN C Take 1 tablet (500 mg total) by mouth 2 (two) times daily.   aspirin EC 81 MG tablet Take 81 mg by mouth daily.   atorvastatin 80 MG tablet Commonly known as: LIPITOR Take 80 mg by mouth daily.   Brilinta 90 MG Tabs tablet Generic drug: ticagrelor Take 1 tablet by mouth twice daily What changed: how much to take   ceFEPime  IVPB Commonly known as: MAXIPIME Inject 2 g into the vein every 12 (twelve) hours. Indication:  Pseudomonas aeruginosa osteomyelitis First Dose: Yes Last Day of Therapy:  01/13/23 Labs - Once weekly:  CBC/D and BMP, Labs - Every other week:  ESR and CRP Method of administration: IV Push Method of administration may be changed at the discretion of home infusion pharmacist based upon assessment of the patient and/or caregiver's ability to self-administer the medication ordered.   dapagliflozin propanediol 10 MG Tabs tablet Commonly known as: [employer]  Take 10 mg by mouth daily.   famotidine 20 MG tablet Commonly known as:  PEPCID Take 20 mg by mouth at bedtime.   FreeStyle [place] 2 Reader Kerrin Mo See admin instructions.   FreeStyle Libre 2 Sensor Misc APPLY TOPICALLY EVERY 14 DAYS   furosemide 40 MG tablet Commonly known as: LASIX Take 1 tablet (40 mg total) by mouth 2 (two) times daily. What changed:  medication strength when to take this additional instructions   gabapentin 100 MG capsule Commonly known as: NEURONTIN Take 1 capsule by mouth at bedtime   insulin aspart 100 UNIT/ML injection Commonly known as: novoLOG Inject 0-9 Units into the skin 3 (three) times daily with meals. CBG < 70: treat for low blood sugar Implement  CBG 70 - 120: 0 units  CBG 121 - 150: 1 unit  CBG 151 - 200: 2 units  CBG 201 - 250: 3 units  CBG 251 - 300: 5 units  CBG 301 - 350: 7 units  CBG 351 - 400: 9 units  CBG > 400: call MD   insulin aspart 100 UNIT/ML injection Commonly known as: novoLOG Inject 3 Units into the skin 3 (three) times daily with meals. Hold for NPO or consuming less than 50% of meals   isosorbide mononitrate 60 MG 24 hr tablet Commonly known as: IMDUR Take 3 tablets (180 mg total) by mouth daily.   Lantus SoloStar 100 UNIT/ML Solostar Pen Generic drug: insulin glargine Inject 15 Units into the skin 2 (two) times daily. Adjust as needed What changed:  how much to take additional instructions   latanoprost 0.005 % ophthalmic solution Commonly known as: XALATAN Place 1 drop into both eyes at bedtime.   metoprolol succinate 25 MG 24 hr tablet Commonly known as: TOPROL-XL Take 3 tablets (75 mg total) by mouth daily.   nitroGLYCERIN 0.4 MG SL tablet Commonly known as: NITROSTAT Place 1 tablet (0.4 mg total) under the tongue every 5 (five) minutes as needed for chest pain.   oxyCODONE-acetaminophen 5-325 MG tablet Commonly known as: PERCOCET/ROXICET Take 1 tablet by mouth every 6 (six) hours as needed for moderate pain.   pantoprazole 40 MG tablet Commonly known as: PROTONIX TAKE  1 TABLET BY MOUTH TWICE DAILY BEFORE MEAL(S) What changed: See the new instructions.   polyethylene glycol powder 17 GM/SCOOP powder Commonly known as: GLYCOLAX/MIRALAX Take 1 capful  (17 g) by mouth daily as needed for moderate constipation.   Senexon-S 8.6-50 MG tablet Generic drug: senna-docusate Take 1 tablet by mouth 2 (two) times daily.   tamsulosin 0.4 MG Caps capsule Commonly known as: FLOMAX Take 1 capsule (0.4 mg total) by mouth daily after supper.   Tradjenta 5 MG Tabs tablet Generic drug: linagliptin Take 1 tablet (5 mg total) by mouth daily.               Discharge Care Instructions  (From admission, onward)           Start     Ordered   12/08/22 0000  Change dressing on IV access line weekly and PRN  (Home infusion instructions - [facility] )        12/08/22 1405   12/08/22 0000  Discharge wound care:       Comments: Continue MWF wound vac changes per podiatry   12/08/22 1405   12/08/22 0000  Non weight bearing       Question Answer Comment  Laterality left   Extremity Lower  12/08/22 1407           Allergies  Allergen Reactions   Chlorthalidone Other (See Comments)    Causes dehydration, kidneys shut down    Contact information for follow-up providers     Criselda Peaches, DPM Follow up.   Specialty: Podiatry Why: Please arrange follow up with podiatry within 2 weeks of discharge Contact information: [place] [address] [phone]         Jabier Mutton, MD Follow up.   Specialty: Infectious Diseases Why: You have an appointment with Dr. Gale Journey on 12/15/2021 Contact information: [address] [place] [address] [phone]              Contact information for after-discharge care     [facility] .   Service: Skilled Nursing Contact information: [address] [place] [phone]                       The results of significant diagnostics from this hospitalization (including imaging, microbiology, ancillary and laboratory) are listed below for reference.    Significant Diagnostic Studies: ECHOCARDIOGRAM COMPLETE  Result Date: 12/09/2022    ECHOCARDIOGRAM REPORT   Patient Name:   JORAM VENSON Strader Date of Exam: 12/09/2022 Medical Rec #:  [id number]        Height:       69.0 in Accession #:    [id number]       Weight:       274.7 lb Date of Birth:  1956/02/16        BSA:          2.364 m Patient Age:    66 years         BP:           142/76 mmHg Patient Gender: M                HR:           87 bpm. Exam Location:  Inpatient Procedure: 2D Echo, Cardiac Doppler and Color Doppler Indications:    Syncope R55  History:        Patient has prior history of Echocardiogram examinations, most                 recent 04/07/2019. CHF and Cardiomyopathy, CAD,                 Signs/Symptoms:Chest Pain and Shortness of Breath; Risk                 Factors:Dyslipidemia, Diabetes, Hypertension, Sleep Apnea and                 Non-Smoker.  Sonographer:    Greer Pickerel Referring Phys: [phone] Arriona Prest CALDWELL POWELL JR  Sonographer Comments: Image acquisition challenging due to patient body habitus, Image acquisition challenging due to COPD and Image acquisition challenging due to respiratory motion. IMPRESSIONS  1. Left ventricular ejection fraction, by estimation, is 55 to 60%. The left ventricle has normal function. The left ventricle has no regional wall motion abnormalities. Left ventricular diastolic parameters were normal.  2. Right ventricular systolic function is normal. The right ventricular size is normal.  3. The mitral valve is normal in structure. No evidence of mitral valve regurgitation. No evidence of mitral stenosis.  4. The aortic valve is normal in structure. Aortic valve regurgitation is not visualized. No  aortic stenosis is present.  5. The inferior vena cava is dilated in size with >50% respiratory  variability, suggesting right atrial pressure of 8 mmHg. FINDINGS  Left Ventricle: Left ventricular ejection fraction, by estimation, is 55 to 60%. The left ventricle has normal function. The left ventricle has no regional wall motion abnormalities. The left ventricular internal cavity size was normal in size. There is  no left ventricular hypertrophy. Left ventricular diastolic parameters were normal. Right Ventricle: The right ventricular size is normal. No increase in right ventricular wall thickness. Right ventricular systolic function is normal. Left Atrium: Left atrial size was normal in size. Right Atrium: Right atrial size was normal in size. Pericardium: There is no evidence of pericardial effusion. Mitral Valve: The mitral valve is normal in structure. Mild mitral annular calcification. No evidence of mitral valve regurgitation. No evidence of mitral valve stenosis. MV peak gradient, 9.6 mmHg. The mean mitral valve gradient is 3.0 mmHg. Tricuspid Valve: The tricuspid valve is normal in structure. Tricuspid valve regurgitation is not demonstrated. No evidence of tricuspid stenosis. Aortic Valve: The aortic valve is normal in structure. Aortic valve regurgitation is not visualized. No aortic stenosis is present. Pulmonic Valve: The pulmonic valve was normal in structure. Pulmonic valve regurgitation is not visualized. No evidence of pulmonic stenosis. Aorta: The aortic root is normal in size and structure. Venous: The inferior vena cava is dilated in size with greater than 50% respiratory variability, suggesting right atrial pressure of 8 mmHg. IAS/Shunts: No atrial level shunt detected by color flow Doppler.  LEFT VENTRICLE PLAX 2D LVIDd:         4.40 cm   Diastology LVIDs:         2.90 cm   LV e' medial:    14.40 cm/s LV PW:         1.00 cm   LV E/e' medial:  5.7 LV IVS:        0.70 cm   LV e' lateral:   7.72 cm/s LVOT diam:     2.20 cm   LV E/e' lateral: 10.6 LV SV:         63 LV SV Index:   27 LVOT Area:      3.80 cm  RIGHT VENTRICLE RV S prime:     12.80 cm/s TAPSE (M-mode): 2.4 cm LEFT ATRIUM             Index        RIGHT ATRIUM           Index LA diam:        3.60 cm 1.52 cm/m   RA Area:     16.00 cm LA Vol (A2C):   72.3 ml 30.58 ml/m  RA Volume:   33.30 ml  14.08 ml/m LA Vol (A4C):   66.2 ml 28.00 ml/m LA Biplane Vol: 69.6 ml 29.44 ml/m  AORTIC VALVE LVOT Vmax:   93.30 cm/s LVOT Vmean:  60.300 cm/s LVOT VTI:    0.166 m  AORTA Ao Root diam: 3.30 cm MITRAL VALVE MV Area (PHT): 3.33 cm     SHUNTS MV Area VTI:   2.75 cm     Systemic VTI:  0.17 m MV Peak grad:  9.6 mmHg     Systemic Diam: 2.20 cm MV Mean grad:  3.0 mmHg MV Vmax:       1.55 m/s MV Vmean:      67.4 cm/s MV Decel Time: 228 msec MV E velocity: 82.00 cm/s MV Jacqulynn Shappell  velocity: 104.00 cm/s MV E/Aubry Rankin ratio:  0.79 Kardie Tobb DO Electronically signed by Berniece Salines DO Signature Date/Time: 12/09/2022/3:10:58 PM    Final    CT HEAD WO CONTRAST (5MM)  Result Date: 12/08/2022 CLINICAL DATA:  Initial evaluation for syncope/presyncope. EXAM: CT HEAD WITHOUT CONTRAST TECHNIQUE: Contiguous axial images were obtained from the base of the skull through the vertex without intravenous contrast. RADIATION DOSE REDUCTION: This exam was performed according to the departmental dose-optimization program which includes automated exposure control, adjustment of the mA and/or kV according to patient size and/or use of iterative reconstruction technique. COMPARISON:  Prior CT from 03/14/2018. FINDINGS: Brain: Cerebral volume within normal limits. No acute intracranial hemorrhage. No convincing large vessel territory infarct. Apparent asymmetric hypodensity overlying the left temporal lobe noted, not seen not convincingly seen on corrected axial, and favored to be artifactual. No mass lesion, midline shift or mass effect. No hydrocephalus or extra-axial fluid collection. Vascular: No abnormal hyperdense vessel. Scattered vascular calcifications noted within the carotid  siphons. Skull: Scalp soft tissues and calvarium within normal limits. Sinuses/Orbits: Globes orbital soft tissues demonstrate no acute finding. Scattered mucosal thickening with Gearl Baratta few retention cysts noted about the ethmoidal air cells and maxillary sinuses. No mastoid effusion. Other: None. IMPRESSION: Negative head CT. No acute intracranial abnormality identified. Electronically Signed   By: Jeannine Boga M.D.   On: 12/08/2022 23:59   EEG adult  Result Date: 12/08/2022 Lora Havens, MD     12/08/2022  5:14 PM Patient Name: Joshua [name] MRN: [id number] Epilepsy Attending: Lora Havens Referring Physician/Provider: Elodia Florence., MD Date: 12/08/2022 Duration: 22.20 mmins Patient history: 66yo M with ams. EEG to evaluate for seizure. Level of alertness: Awake, asleep AEDs during EEG study: None Technical aspects: This EEG study was done with scalp electrodes positioned according to the 10-20 International system of electrode placement. Electrical activity was reviewed with band pass filter of 1-_0 , sensitivity of 7 uV/mm, display speed of 10m/sec with Dezi Brauner _1  notched filter applied as appropriate. EEG data were recorded continuously and digitally stored.  Video monitoring was available and reviewed as appropriate. Description: The posterior dominant rhythm consists of 9 Hz activity of moderate voltage (25-35 uV) seen predominantly in posterior head regions, symmetric and reactive to eye opening and eye closing. Sleep was characterized by vertex waves, sleep spindles (12 to 14 Hz), maximal frontocentral region.  Physiologic photic driving was not seen during photic stimulation.  Hyperventilation was not performed.   IMPRESSION: This study is within normal limits. No seizures or epileptiform discharges were seen throughout the recording. Priyanka O Yadav   [place] SITE RITE  Result Date: 12/03/2022 [facility] not [facility], placement could not be confirmed due to current  cardiac rhythm.  DG Foot Complete Left  Result Date: 11/30/2022 CLINICAL DATA:  Status post left foot surgery EXAM: LEFT FOOT - COMPLETE 3+ VIEW COMPARISON:  Left foot x-ray 11/29/2022 FINDINGS: There has been interval surgical removal of the proximal aspect of the fifth metatarsal. There is overlying skin staples and wound VAC present. There is no acute fracture or dislocation. There is soft tissue swelling of the anterior foot. IMPRESSION: Interval surgical removal of the proximal aspect of the fifth metatarsal with overlying skin staples and wound VAC in place. Electronically Signed   By: ARonney AstersM.D.   On: 11/30/2022 20:50   DG Foot Complete Left  Result Date: 11/29/2022 CLINICAL DATA:  Osteomyelitis EXAM: LEFT FOOT - COMPLETE 3+ VIEW  COMPARISON:  Left foot x-ray 11/07/2022. FINDINGS: Skin staples and overlying drain have been removed in the interval. Again seen is lucency and fragmentation of the base of the fifth metatarsal compatible with patient's known osteomyelitis. Alignment appears stable in near anatomic. There is no evidence for dislocation. There is soft tissue swelling of the mid and anterior foot diffusely. IMPRESSION: Lucency and fragmentation of the base of the fifth metatarsal compatible with patient's known osteomyelitis. Alignment appears stable. Electronically Signed   By: Ronney Asters M.D.   On: 11/29/2022 21:42   PERIPHERAL VASCULAR CATHETERIZATION  Result Date: 11/29/2022 Images from the original result were not included. Patient name: Joshua [name] MRN: [id number] DOB: 10/01/1956 Sex: male 11/29/2022 Pre-operative Diagnosis: Chronic left lower extremity limb threatening ischemia with large ulceration Post-operative diagnosis:  Same Surgeon:  Eda Paschal. Donzetta Matters, MD Procedure Performed: 1.  Ultrasound-guided cannulation right common femoral artery 2.  CO2 aortogram 3.  Selection of left common femoral artery and selection of left popliteal artery at the left lower  extremity with left lower extremity angiogram 4.  Mynx device closure right common femoral artery 5.  Moderate sedation with fentanyl and Versed for 26 minutes Indications: 66 year old male with diabetes and large ulceration on his foot for the past 6 weeks now with normal ABIs but monophasic tibial vessels bilaterally indicated for angiography with possible intervention. Findings: The aorta and iliac segments are free of flow-limiting stenosis.  Left common femoral and SFA are patent with brisk flow using CO2.  Contrast below the knee demonstrates an occluded anterior tibial artery just after the takeoff that does not reconstitute with very large posterior tibial artery which is the dominant runoff to the foot and peroneal artery to the level of the ankle. Patient will need diligent wound care and remains high risk for below-knee amputation.  Procedure:  The patient was identified in the holding area and taken to room 8.  The patient was then placed supine on the table and prepped and draped in the usual sterile fashion.  Gustave Lindeman time out was called.  Ultrasound was used to evaluate the right common femoral artery which was noted be patent and compressible.  The area was anesthetized 1% lidocaine and cannulated with micropuncture needle followed by wire and sheath.  And images saved department record.  Concomitantly we administered fentanyl and Versed and his vital signs were monitored through by bedside nursing throughout the case.  We placed Saina Waage Bentson wire followed by 5 [place] sheath and then placed an Omni catheter to the level of L1 and CO2 aortogram was performed.  We crossed the bifurcation the level of the common femoral artery perform CO2 left lower extremity angiography and then to the level of the knee we placed Felma Pfefferle straight catheter over the Glidewire and performed contrasted angiography below the knee.  With the above findings we elected no intervention.  Catheter was removed with Lia Vigilante wire.  Mynx device was  deployed without complication. Contrast: 20cc Brandon C. Donzetta Matters, MD [facility] Office: [phone] Pager: [phone]  VAS [place] LOWER EXTREMITY ARTERIAL DUPLEX  Result Date: 11/26/2022 LOWER EXTREMITY ARTERIAL DUPLEX STUDY Patient Name:  AAHIL FREDIN Ericson  Date of Exam:   11/26/2022 Medical Rec #: [id number]         Accession #:    [id number] Date of Birth: 1956-08-10         Patient Gender: M Patient Age:   71 years Exam Location:  [facility] Procedure:  VAS [place] LOWER EXTREMITY ARTERIAL DUPLEX Referring Phys: Dagoberto Ligas --------------------------------------------------------------------------------  Indications: Peripheral artery disease, and Non healing left foot wound and              right transmetatarsal amputation. High Risk Factors: Hypertension, hyperlipidemia, Diabetes, coronary artery                    disease.  Current ABI: 11/01/22 - Rt 1.28, lt 1.1 Comparison Study: No priors. Performing Technologist: Oda Cogan RDMS, RVT  Examination Guidelines: Earnestine Tuohey complete evaluation includes B-mode imaging, spectral Doppler, color Doppler, and power Doppler as needed of all accessible portions of each vessel. Bilateral testing is considered an integral part of Zayne Draheim complete examination. Limited examinations for reoccurring indications may be performed as noted.   +-----------+--------+-----+---------------+----------+----------------+ LEFT       PSV cm/sRatioStenosis       Waveform  Comments         +-----------+--------+-----+---------------+----------+----------------+ CFA Prox   118                         triphasic                  +-----------+--------+-----+---------------+----------+----------------+ DFA        62                          biphasic                   +-----------+--------+-----+---------------+----------+----------------+ SFA Prox   147          30-49% stenosisbiphasic                    +-----------+--------+-----+---------------+----------+----------------+ SFA Mid    194          30-49% stenosisbiphasic                   +-----------+--------+-----+---------------+----------+----------------+ SFA Distal 73                                                     +-----------+--------+-----+---------------+----------+----------------+ POP Prox   91                                                     +-----------+--------+-----+---------------+----------+----------------+ POP Distal 93                                                     +-----------+--------+-----+---------------+----------+----------------+ ATA Prox   28                          biphasic                   +-----------+--------+-----+---------------+----------+----------------+ ATA Distal 0                                     appears occluded +-----------+--------+-----+---------------+----------+----------------+ PTA Prox  82                          monophasic                 +-----------+--------+-----+---------------+----------+----------------+ PTA Mid    183          30-49% stenosismonophasic                 +-----------+--------+-----+---------------+----------+----------------+ PTA Distal 145                         monophasic                 +-----------+--------+-----+---------------+----------+----------------+ PERO Prox  72                          monophasic                 +-----------+--------+-----+---------------+----------+----------------+ PERO Mid   62                                                     +-----------+--------+-----+---------------+----------+----------------+ PERO Distal93                                                     +-----------+--------+-----+---------------+----------+----------------+  Summary: Left: 30-49% stenosis noted in the superficial femoral artery. 30-49% stenosis noted in the posterior tibial  artery. Total occlusion of the distal anterior tibial artery.  See table(s) above for measurements and observations. Electronically signed by Jamelle Haring on 11/26/2022 at 5:59:53 PM.    Final     Microbiology: Recent Results (from the past 240 hour(s))  Surgical pcr screen     Status: None   Collection Time: 11/30/22  7:40 AM   Specimen: Nasal Mucosa; Nasal Swab  Result Value Ref Range Status   MRSA, PCR NEGATIVE NEGATIVE Final   Staphylococcus aureus NEGATIVE NEGATIVE Final    Comment: (NOTE) The Xpert SA Assay (FDA approved for NASAL specimens in patients 64 years of age and older), is one component of Elnor Renovato comprehensive surveillance program. It is not intended to diagnose infection nor to guide or monitor treatment. Performed at [facility], [place] [address], [place], [place] [address]   Fungus Culture With Stain     Status: None (Preliminary result)   Collection Time: 11/30/22  4:43 PM   Specimen: PATH Other; Tissue  Result Value Ref Range Status   Fungus Stain Final report  Final    Comment: (NOTE) Performed At: [facility] [place], [place] [id number] Rush Farmer MD XB:[id number]    Fungus (Mycology) Culture PENDING  Incomplete   Fungal Source BONE  Final    Comment: 5TH METATARSAL Performed at [facility], [place] [address] [place], [place] [address]   Aerobic/Anaerobic Culture w Gram Stain (surgical/deep wound)     Status: None   Collection Time: 11/30/22  4:43 PM   Specimen: PATH Other; Tissue  Result Value Ref Range Status   Specimen Description BONE  Final   Special Requests 5TH METATARSAL  Final   Gram Stain NO WBC SEEN  NO ORGANISMS SEEN   Final   Culture   Final    RARE PSEUDOMONAS AERUGINOSA CRITICAL RESULT CALLED TO, READ BACK BY AND VERIFIED WITH: RN Parker  [phone] FCP NO ANAEROBES ISOLATED Performed at [facility], [place] [address] [place], [place] [address]    Report Status 12/05/2022 FINAL  Final    Organism ID, Bacteria PSEUDOMONAS AERUGINOSA  Final      Susceptibility   Pseudomonas aeruginosa - MIC*    CEFTAZIDIME 4 SENSITIVE Sensitive     CIPROFLOXACIN <=0.25 SENSITIVE Sensitive     GENTAMICIN <=1 SENSITIVE Sensitive     IMIPENEM 1 SENSITIVE Sensitive     PIP/TAZO 16 SENSITIVE Sensitive     CEFEPIME 2 SENSITIVE Sensitive     * RARE PSEUDOMONAS AERUGINOSA  Fungus Culture Result     Status: None   Collection Time: 11/30/22  4:43 PM  Result Value Ref Range Status   Result 1 Comment  Final    Comment: (NOTE) KOH/Calcofluor preparation:  no fungus observed. Performed At: [facility] [place], [place] [id number] Rush Farmer MD LY:[id number]   Fungus Culture With Stain     Status: None (Preliminary result)   Collection Time: 11/30/22  4:45 PM   Specimen: PATH Other; Tissue  Result Value Ref Range Status   Fungus Stain Final report  Final    Comment: (NOTE) Performed At: [facility] [place], [place] [id number] Rush Farmer MD VC:[id number]    Fungus (Mycology) Culture PENDING  Incomplete   Fungal Source BONE  Final    Comment: CUBOID  Performed at [facility], [place] [address] [place], [place] [address]   Aerobic/Anaerobic Culture w Gram Stain (surgical/deep wound)     Status: None   Collection Time: 11/30/22  4:45 PM   Specimen: PATH Other; Tissue  Result Value Ref Range Status   Specimen Description BONE  Final   Special Requests CUBOID  Final   Gram Stain NO WBC SEEN NO ORGANISMS SEEN   Final   Culture   Final    No growth aerobically or anaerobically. Performed at [facility], [place] [address], [place], [place] [address]    Report Status 12/05/2022 FINAL  Final  Fungus Culture Result     Status: None   Collection Time: 11/30/22  4:45 PM  Result Value Ref Range Status   Result 1 Comment  Final    Comment: (NOTE) KOH/Calcofluor preparation:  no fungus observed. Performed At: [facility] [place], [place] [id number] Rush Farmer MD ZE:[id number]      Labs: Basic Metabolic Panel: Recent Labs  Lab 12/04/22 0825 12/05/22 1853 12/09/22 0500  NA 136 138 137  K 3.8 4.0 3.4*  CL 108 106 108  CO2 _0 GLUCOSE 96 127* 131*  BUN 22 25* 38*  CREATININE 1.37* 1.47* 2.04*  CALCIUM 8.7* 8.9 8.4*  MG  --   --  2.1   Liver Function Tests: No results for input(s): "AST", "ALT", "ALKPHOS", "BILITOT", "PROT", "ALBUMIN" in the last 168 hours. No results for input(s): "LIPASE", "AMYLASE" in the last 168 hours. No results for input(s): "AMMONIA" in the last 168 hours. CBC: Recent Labs  Lab 12/04/22 0825 12/05/22 1853 12/09/22 1102  WBC 5.6  --  6.5  HGB 10.6* 11.0* 9.7*  HCT 33.6* 33.7* 30.3*  MCV 89.1  --  88.1  PLT 187  --  131*  Cardiac Enzymes: No results for input(s): "CKTOTAL", "CKMB", "CKMBINDEX", "TROPONINI" in the last 168 hours. BNP: BNP (last 3 results) No results for input(s): "BNP" in the last 8760 hours.  ProBNP (last 3 results) No results for input(s): "PROBNP" in the last 8760 hours.  CBG: Recent Labs  Lab 12/08/22 1521 12/08/22 2104 12/08/22 2210 12/09/22 0745 12/09/22 1235  GLUCAP 86 64* 86 156* 135*       Signed:  Fayrene Helper MD.  [facility] 12/09/2022, 3:44 PM

## 2022-12-09 NOTE — Progress Notes (Signed)
Physical Therapy Treatment Patient Details Name: Joshua [name] MRN: [id number] DOB: 1956/09/18 Today's Date: 12/09/2022   History of Present Illness 66 y.o. male  who is admitted to [facility] on 11/25/2022 with nonhealing wound of the left foot after presenting from podiatry clinic to [facility] ED complaining of such. Found to have poor circulation and osteomyelitis S/p 12/18 ultrasound guided cannulation of R common femoral artery and 12/19 partial resection of L 5th metatarsal  and skin graft PMH: poorly healing wound to the left lateral foot, type 2 diabetes mellitus complicated by diabetic peripheral polyneuropathy, chronic diastolic heart failure, CKD 3B with baseline creatinine range 2-2.4, essential hypertension, hyperlipidemia,   PT Comments    Patient is agreeable to PT. He was seated on the side of the bed. No dizziness reported with activity, however patient does report feeling pressure behind his eyes. Vitals stable throughout session. Patient was able to stand with minimal assistance for lifting and reinforcement of technique and importance to maintain weight bearing restrictions for wound healing. Recommend to continue PT to maximize independence and decrease caregiver burden. SNF is recommended at discharge.    Recommendations for follow up therapy are one component of a multi-disciplinary discharge planning process, led by the attending physician.  Recommendations may be updated based on patient status, additional functional criteria and insurance authorization.  Follow Up Recommendations  Skilled nursing-short term rehab (<3 hours/day) Can patient physically be transported by private vehicle: Yes   Assistance Recommended at Discharge Intermittent Supervision/Assistance  Patient can return home with the following A lot of help with walking and/or transfers;A little help with bathing/dressing/bathroom;Assist for transportation;Help with stairs or ramp for entrance;Assistance  with cooking/housework   Equipment Recommendations   (to be determined at next level of care)    Recommendations for Other Services       Precautions / Restrictions Precautions Precautions: Fall Precaution Comments: wound vac L foot Required Braces or Orthoses: Other Brace Other Brace: R post op shoe, L PRAFO Restrictions Weight Bearing Restrictions: Yes LLE Weight Bearing: Non weight bearing     Mobility  Bed Mobility               General bed mobility comments: not observed as patient sitting up on arrival to room and post session    Transfers Overall transfer level: Needs assistance Equipment used: Rolling walker (2 wheels) Transfers: Sit to/from Stand Sit to Stand: Min assist           General transfer comment: lifting assistance required for standing. verbal cues for LLE positioning to maintain NWB and for hand placement to facilitate independence and safety with standing. standing tolerance of several minutes while maintaining weight bearing status with reinforcement of importance during mobility for wound healing.    Ambulation/Gait                   Stairs             Wheelchair Mobility    Modified Rankin (Stroke Patients Only)       Balance Overall balance assessment: Needs assistance Sitting-balance support: Feet supported, No upper extremity supported Sitting balance-Leahy Scale: Normal     Standing balance support: Bilateral upper extremity supported Standing balance-Leahy Scale: Poor Standing balance comment: external support required                            Cognition Arousal/Alertness: Awake/alert Behavior During Therapy: WFL for tasks assessed/performed Overall  Cognitive Status: Within Functional Limits for tasks assessed                                          Exercises      General Comments        Pertinent Vitals/Pain Pain Assessment Pain Assessment: No/denies pain     Home Living                          Prior Function            PT Goals (current goals can now be found in the care plan section) Acute Rehab PT Goals Patient Stated Goal: to get to rehab PT Goal Formulation: With patient Time For Goal Achievement: 12/15/22 Potential to Achieve Goals: Good Progress towards PT goals: Progressing toward goals    Frequency    Min 3X/week      PT Plan Current plan remains appropriate    Co-evaluation              AM-PAC PT "6 Clicks" Mobility   Outcome Measure  Help needed turning from your back to your side while in a flat bed without using bedrails?: None Help needed moving from lying on your back to sitting on the side of a flat bed without using bedrails?: A Little Help needed moving to and from a bed to a chair (including a wheelchair)?: A Little Help needed standing up from a chair using your arms (e.g., wheelchair or bedside chair)?: A Lot Help needed to walk in hospital room?: A Lot Help needed climbing 3-5 steps with a railing? : Total 6 Click Score: 15    End of Session Equipment Utilized During Treatment:  (post-op shoe RLE) Activity Tolerance: Patient tolerated treatment well Patient left: in bed (seated on bed per patient preference)   PT Visit Diagnosis: Unsteadiness on feet (R26.81);Other abnormalities of gait and mobility (R26.89);Difficulty in walking, not elsewhere classified (R26.2)     Time: 2505-3976 PT Time Calculation (min) (ACUTE ONLY): 41 min  Charges:  $Therapeutic Activity: 38-52 mins                     [name], PT, MPT    [name] 12/09/2022, 2:14 PM

## 2022-12-09 NOTE — Progress Notes (Signed)
Occupational Therapy Treatment Patient Details Name: Joshua [name] MRN: [id number] DOB: 1956/07/27 Today's Date: 12/09/2022   History of present illness 66 y.o. male  who is admitted to [facility] on 11/25/2022 with nonhealing wound of the left foot after presenting from podiatry clinic to [facility] ED complaining of such. Found to have poor circulation and osteomyelitis S/p 12/18 ultrasound guided cannulation of R common femoral artery and 12/19 partial resection of L 5th metatarsal  and skin graft PMH: poorly healing wound to the left lateral foot, type 2 diabetes mellitus complicated by diabetic peripheral polyneuropathy, chronic diastolic heart failure, CKD 3B with baseline creatinine range 2-2.4, essential hypertension, hyperlipidemia,   OT comments  Pt completed bed mobility with modified independence. Min assist to ambulate short distance with RW to sit in chair at sink for grooming. Pt left at EOB at end of session, educated pt to elevate B LEs when at rest to promote healing. Continues to be appropriate for SNF level rehab.    Recommendations for follow up therapy are one component of a multi-disciplinary discharge planning process, led by the attending physician.  Recommendations may be updated based on patient status, additional functional criteria and insurance authorization.    Follow Up Recommendations  Skilled nursing-short term rehab (<3 hours/day)     Assistance Recommended at Discharge Frequent or constant Supervision/Assistance  Patient can return home with the following  A little help with walking and/or transfers;A little help with bathing/dressing/bathroom;Assistance with cooking/housework;Direct supervision/assist for financial management;Assist for transportation;Help with stairs or ramp for entrance   Equipment Recommendations  Wheelchair (measurements OT);Wheelchair cushion (measurements OT)    Recommendations for Other Services      Precautions /  Restrictions Precautions Precautions: Fall Precaution Comments: wound vac L foot Required Braces or Orthoses: Other Brace Other Brace: R post op shoe, L PRAFO Restrictions Weight Bearing Restrictions: Yes LLE Weight Bearing: Non weight bearing       Mobility Bed Mobility Overal bed mobility: Modified Independent                  Transfers Overall transfer level: Needs assistance Equipment used: Rolling walker (2 wheels) Transfers: Sit to/from Stand Sit to Stand: Min assist                 Balance Overall balance assessment: Needs assistance Sitting-balance support: Feet supported, No upper extremity supported Sitting balance-Leahy Scale: Normal     Standing balance support: Bilateral upper extremity supported, Reliant on assistive device for balance Standing balance-Leahy Scale: Poor                             ADL either performed or assessed with clinical judgement   ADL Overall ADL's : Needs assistance/impaired     Grooming: Wash/dry hands;Wash/dry face;Oral care;Brushing hair;Sitting;Set up           Upper Body Dressing : Set up;Sitting                   Functional mobility during ADLs: Minimal assistance;Rolling walker (2 wheels)      Extremity/Trunk Assessment              Vision       Perception     Praxis      Cognition Arousal/Alertness: Awake/alert Behavior During Therapy: WFL for tasks assessed/performed Overall Cognitive Status: Within Functional Limits for tasks assessed  Exercises      Shoulder Instructions       General Comments      Pertinent Vitals/ Pain       Pain Assessment Pain Assessment: Faces Faces Pain Scale: No hurt  Home Living                                          Prior Functioning/Environment              Frequency  Min 2X/week        Progress Toward Goals  OT Goals(current goals  can now be found in the care plan section)  Progress towards OT goals: Progressing toward goals  Acute Rehab OT Goals OT Goal Formulation: With patient Time For Goal Achievement: 12/16/22 Potential to Achieve Goals: Good  Plan Discharge plan remains appropriate    Co-evaluation                 AM-PAC OT "6 Clicks" Daily Activity     Outcome Measure   Help from another person eating meals?: None Help from another person taking care of personal grooming?: None Help from another person toileting, which includes using toliet, bedpan, or urinal?: A Little Help from another person bathing (including washing, rinsing, drying)?: A Little Help from another person to put on and taking off regular upper body clothing?: None Help from another person to put on and taking off regular lower body clothing?: A Little 6 Click Score: 21    End of Session Equipment Utilized During Treatment: Gait belt;Rolling walker (2 wheels)  OT Visit Diagnosis: Unsteadiness on feet (R26.81);Muscle weakness (generalized) (M62.81);Pain   Activity Tolerance Patient tolerated treatment well   Patient Left in bed;with call bell/phone within reach   Nurse Communication          Time: 1201-1221 OT Time Calculation (min): 20 min  Charges: OT General Charges $OT Visit: 1 Visit OT Treatments $Self Care/Home Management : 8-22 mins  Cleta Alberts, OTR/L Acute Rehabilitation Services Office: [phone]   Malka So 12/09/2022, 1:13 PM

## 2022-12-15 ENCOUNTER — Other Ambulatory Visit: Payer: Self-pay

## 2022-12-15 ENCOUNTER — Encounter: Payer: Self-pay | Admitting: Internal Medicine

## 2022-12-15 ENCOUNTER — Ambulatory Visit (INDEPENDENT_AMBULATORY_CARE_PROVIDER_SITE_OTHER): Payer: Medicare Other | Admitting: Internal Medicine

## 2022-12-15 VITALS — BP 125/72 | HR 81 | Temp 97.1°F

## 2022-12-15 DIAGNOSIS — L97529 Non-pressure chronic ulcer of other part of left foot with unspecified severity: Secondary | ICD-10-CM | POA: Diagnosis not present

## 2022-12-15 DIAGNOSIS — E785 Hyperlipidemia, unspecified: Secondary | ICD-10-CM | POA: Diagnosis not present

## 2022-12-15 DIAGNOSIS — E11628 Type 2 diabetes mellitus with other skin complications: Secondary | ICD-10-CM | POA: Diagnosis not present

## 2022-12-15 DIAGNOSIS — S91302A Unspecified open wound, left foot, initial encounter: Secondary | ICD-10-CM | POA: Diagnosis not present

## 2022-12-15 DIAGNOSIS — N1832 Chronic kidney disease, stage 3b: Secondary | ICD-10-CM | POA: Diagnosis not present

## 2022-12-15 DIAGNOSIS — S80811A Abrasion, right lower leg, initial encounter: Secondary | ICD-10-CM | POA: Diagnosis not present

## 2022-12-15 DIAGNOSIS — G629 Polyneuropathy, unspecified: Secondary | ICD-10-CM | POA: Diagnosis not present

## 2022-12-15 DIAGNOSIS — M86172 Other acute osteomyelitis, left ankle and foot: Secondary | ICD-10-CM | POA: Diagnosis not present

## 2022-12-15 DIAGNOSIS — M869 Osteomyelitis, unspecified: Secondary | ICD-10-CM

## 2022-12-15 DIAGNOSIS — R239 Unspecified skin changes: Secondary | ICD-10-CM | POA: Diagnosis not present

## 2022-12-15 DIAGNOSIS — K219 Gastro-esophageal reflux disease without esophagitis: Secondary | ICD-10-CM | POA: Diagnosis not present

## 2022-12-15 DIAGNOSIS — E119 Type 2 diabetes mellitus without complications: Secondary | ICD-10-CM | POA: Diagnosis not present

## 2022-12-15 DIAGNOSIS — E11621 Type 2 diabetes mellitus with foot ulcer: Secondary | ICD-10-CM

## 2022-12-15 DIAGNOSIS — M6281 Muscle weakness (generalized): Secondary | ICD-10-CM | POA: Diagnosis not present

## 2022-12-15 DIAGNOSIS — I10 Essential (primary) hypertension: Secondary | ICD-10-CM | POA: Diagnosis not present

## 2022-12-15 DIAGNOSIS — I5042 Chronic combined systolic (congestive) and diastolic (congestive) heart failure: Secondary | ICD-10-CM | POA: Diagnosis not present

## 2022-12-15 DIAGNOSIS — M109 Gout, unspecified: Secondary | ICD-10-CM | POA: Diagnosis not present
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
## 2022-12-15 NOTE — Patient Instructions (Signed)
Continue to stay off your left foot   Check in with podiatry about how the wound looks   You'll be on cefepime until 01/13/2023 -- at this point nursing home can remove picc  They need to do once a week blood test and send results to use  Otherwise will see you again in around 4 weeks

## 2022-12-16 DIAGNOSIS — I5042 Chronic combined systolic (congestive) and diastolic (congestive) heart failure: Secondary | ICD-10-CM | POA: Diagnosis not present

## 2022-12-16 DIAGNOSIS — M86172 Other acute osteomyelitis, left ankle and foot: Secondary | ICD-10-CM | POA: Diagnosis not present

## 2022-12-16 DIAGNOSIS — G629 Polyneuropathy, unspecified: Secondary | ICD-10-CM | POA: Diagnosis not present

## 2022-12-16 DIAGNOSIS — E119 Type 2 diabetes mellitus without complications: Secondary | ICD-10-CM | POA: Diagnosis not present

## 2022-12-16 DIAGNOSIS — I10 Essential (primary) hypertension: Secondary | ICD-10-CM | POA: Diagnosis not present

## 2022-12-16 DIAGNOSIS — K219 Gastro-esophageal reflux disease without esophagitis: Secondary | ICD-10-CM | POA: Diagnosis not present

## 2022-12-16 DIAGNOSIS — E039 Hypothyroidism, unspecified: Secondary | ICD-10-CM | POA: Diagnosis not present

## 2022-12-16 DIAGNOSIS — N1832 Chronic kidney disease, stage 3b: Secondary | ICD-10-CM | POA: Diagnosis not present

## 2022-12-16 DIAGNOSIS — E559 Vitamin D deficiency, unspecified: Secondary | ICD-10-CM | POA: Diagnosis not present

## 2022-12-16 DIAGNOSIS — R5381 Other malaise: Secondary | ICD-10-CM | POA: Diagnosis not present

## 2022-12-16 DIAGNOSIS — E785 Hyperlipidemia, unspecified: Secondary | ICD-10-CM | POA: Diagnosis not present

## 2022-12-16 DIAGNOSIS — D518 Other vitamin B12 deficiency anemias: Secondary | ICD-10-CM | POA: Diagnosis not present

## 2022-12-16 DIAGNOSIS — M109 Gout, unspecified: Secondary | ICD-10-CM | POA: Diagnosis not present

## 2022-12-22 ENCOUNTER — Ambulatory Visit (INDEPENDENT_AMBULATORY_CARE_PROVIDER_SITE_OTHER): Payer: 59 | Admitting: Podiatry

## 2022-12-22 DIAGNOSIS — E11628 Type 2 diabetes mellitus with other skin complications: Secondary | ICD-10-CM | POA: Diagnosis not present

## 2022-12-22 DIAGNOSIS — Z4889 Encounter for other specified surgical aftercare: Secondary | ICD-10-CM | POA: Diagnosis not present

## 2022-12-22 DIAGNOSIS — R239 Unspecified skin changes: Secondary | ICD-10-CM | POA: Diagnosis not present

## 2022-12-22 DIAGNOSIS — L97424 Non-pressure chronic ulcer of left heel and midfoot with necrosis of bone: Secondary | ICD-10-CM

## 2022-12-22 DIAGNOSIS — M6281 Muscle weakness (generalized): Secondary | ICD-10-CM | POA: Diagnosis not present

## 2022-12-22 DIAGNOSIS — S80811D Abrasion, right lower leg, subsequent encounter: Secondary | ICD-10-CM | POA: Diagnosis not present

## 2022-12-24 DIAGNOSIS — R5381 Other malaise: Secondary | ICD-10-CM | POA: Diagnosis not present

## 2022-12-24 DIAGNOSIS — I5042 Chronic combined systolic (congestive) and diastolic (congestive) heart failure: Secondary | ICD-10-CM | POA: Diagnosis not present

## 2022-12-24 DIAGNOSIS — I10 Essential (primary) hypertension: Secondary | ICD-10-CM | POA: Diagnosis not present

## 2022-12-24 DIAGNOSIS — E785 Hyperlipidemia, unspecified: Secondary | ICD-10-CM | POA: Diagnosis not present

## 2022-12-24 DIAGNOSIS — M109 Gout, unspecified: Secondary | ICD-10-CM | POA: Diagnosis not present

## 2022-12-24 DIAGNOSIS — K219 Gastro-esophageal reflux disease without esophagitis: Secondary | ICD-10-CM | POA: Diagnosis not present

## 2022-12-24 DIAGNOSIS — E119 Type 2 diabetes mellitus without complications: Secondary | ICD-10-CM | POA: Diagnosis not present

## 2022-12-27 DIAGNOSIS — E1121 Type 2 diabetes mellitus with diabetic nephropathy: Secondary | ICD-10-CM | POA: Diagnosis not present

## 2022-12-27 DIAGNOSIS — J449 Chronic obstructive pulmonary disease, unspecified: Secondary | ICD-10-CM | POA: Diagnosis not present

## 2022-12-27 DIAGNOSIS — I5030 Unspecified diastolic (congestive) heart failure: Secondary | ICD-10-CM | POA: Diagnosis not present

## 2022-12-27 NOTE — Progress Notes (Signed)
  Subjective:  Patient ID: Joshua [name], male    DOB: 1956/10/31,  MRN: [id number]  Chief Complaint  Patient presents with   Diabetic Ulcer   Routine Post Op    DOS 11/30/22 2 week follow up left midfoot       67 y.o. male returns for post-op check.  Overall doing okay he is at facility they have been using the wound VAC  Review of Systems: Negative except as noted in the HPI. Denies N/V/F/Ch.   Objective:  There were no vitals filed for this visit. There is no height or weight on file to calculate BMI. Constitutional Well developed. Well nourished.  Vascular Foot warm and well perfused. Capillary refill normal to all digits.  Calf is soft and supple, no posterior calf or knee pain, negative Homans' sign  Neurologic Normal speech. Oriented to person, place, and time. Epicritic sensation to light touch grossly absent bilaterally.  Dermatologic Overall fairly good graft take, still quite a bit of depth to wound, no signs of infection  Orthopedic: He has now tenderness to palpation noted about the surgical site.     Assessment:   1. Ulcer of left midfoot with necrosis of bone ([place])    Plan:  Patient was evaluated and treated and all questions answered.  S/p foot surgery left -Progressing as expected post-operatively. -All staples and sutures removed today -Continue nonweightbearing -Reapply wound VAC tomorrow and continue changing 3 times weekly  Return in about 3 weeks (around 01/12/2023) for wound care.

## 2022-12-28 ENCOUNTER — Encounter: Payer: Self-pay | Admitting: Internal Medicine

## 2022-12-28 ENCOUNTER — Ambulatory Visit (INDEPENDENT_AMBULATORY_CARE_PROVIDER_SITE_OTHER): Payer: 59 | Admitting: Internal Medicine

## 2022-12-28 VITALS — BP 150/88 | HR 85 | Ht 69.0 in

## 2022-12-28 DIAGNOSIS — E1165 Type 2 diabetes mellitus with hyperglycemia: Secondary | ICD-10-CM | POA: Diagnosis not present

## 2022-12-28 DIAGNOSIS — E1159 Type 2 diabetes mellitus with other circulatory complications: Secondary | ICD-10-CM | POA: Diagnosis not present

## 2022-12-28 LAB — POCT GLYCOSYLATED HEMOGLOBIN (HGB A1C): Hemoglobin A1C: 7.5 % — AB (ref 4.0–5.6)

## 2022-12-28 MED ORDER — LANTUS SOLOSTAR 100 UNIT/ML ~~LOC~~ SOPN: 15.0000 [IU] | PEN_INJECTOR | Freq: Two times a day (BID) | SUBCUTANEOUS | 3 refills | Status: DC

## 2022-12-28 MED ORDER — DAPAGLIFLOZIN PROPANEDIOL 10 MG PO TABS: 10.0000 mg | ORAL_TABLET | Freq: Every day | ORAL | 3 refills | Status: DC

## 2022-12-28 MED ORDER — OZEMPIC (1 MG/DOSE) 4 MG/3ML ~~LOC~~ SOPN: 1.0000 mg | PEN_INJECTOR | SUBCUTANEOUS | 3 refills | Status: DC

## 2022-12-28 MED ORDER — LANTUS SOLOSTAR 100 UNIT/ML ~~LOC~~ SOPN: 20.0000 [IU] | PEN_INJECTOR | Freq: Two times a day (BID) | SUBCUTANEOUS | 3 refills | Status: DC

## 2022-12-28 NOTE — Progress Notes (Signed)
Patient ID: Joshua [name], male   DOB: May 11, 1956, 67 y.o.   MRN: [id number]   HPI: Joshua [name] is a 67 y.o.-year-old male, initially referred by his PCP, Dr. Nicholos [name], returning for follow-up for DM2, dx in 1990s, insulin-dependent, uncontrolled, with complications (CAD - s/p AMI 2012, s/p PCIs, ischemic cardiomyopathy, CHF, CKD stage 4, PN - s/p R transmetatarsal amputation, gastroparesis, DR, history of DKA). He saw Dr Joshua [name] initially, then Dr. Talmage [name] for 20 years, but she was not accepting Medicaid patients anymore so he had to switch providers.  Last visit with me 4 months ago. He now has [employer].  Interim history: No increased urination, blurry vision, nausea,  chest pain. He has dizziness. He has back pain.  This is chronic for him. He has a 64 oz fluid restriction 2/2 leg swelling. He has been hospitalized for a foot ulcer >> now nonweightbearing, on iv ABx. His regimen was changed in the hospital. Now in SNF rehab. He is basically homeless, but working with several agencies to help him with this.  Reviewed HbA1c levels:  Lab Results  Component Value Date   HGBA1C 8.4 (H) 12/01/2022   HGBA1C 7.6 (A) 08/26/2022   HGBA1C 8.1 (A) 04/22/2022   HGBA1C 12.9 (A) 01/21/2022   HGBA1C 11.2 (A) 10/28/2021   HGBA1C 9.2 (H) 08/11/2021   HGBA1C 9.1 (A) 06/17/2021   HGBA1C 8.3 (A) 02/10/2021   HGBA1C 7.5 (A) 08/15/2020   HGBA1C 8.9 (A) 05/15/2020   He was on: - Lantus 20 units 2x a day - NovoLog 10 units 3x a day, before meals Prev. On 70/30 until Spring 2020.  At last visit he was on: - Farxiga 10 mg before b'fast  - Basaglar 30 >> 20-25 >> 30 units a day 2x a day >> Lantus 30 units 2x a day - Humalog 10-15 >> 10-20 >> 10-12 >> 18 >> 15-20 units before meals  - Ozempic 1 mg weekly (started to take it correctly only after 12/29/2021 after he saw the diabetes educator) Previously on Lantus, NovoLog, Victoza.  He contacted [place] 11/13/2022 that he was placed on another  regimen: - Lantus 5 units twice a day >> now ? In SNF (7 units) - now ? Aspart after the meal in SNF - Tradjenta 5 mg in a.m. - just in the hospital - Farxiga 10 mg in a.m. - ? If taking - took Ozempic 1x in last 1 mo At that time, we could not make adjustments in his regimen as he was not checking blood sugars.  He is now checking blood sugars >4x a day with his his freestyle libre CGM - just attached it so we have no data for this, but I reviewed records from the facility: - am: 103-287 - 2h after b'fast: ? - lunch: 130-249 - 2h after lunch: ? - dinner: 133-320 - 2h after dinner: 200-300s - bedtime: ?  Previously:   Previously:   Lowest sugar was 60 (correction of a high) >> 57 (took insulin late after eating, alcohol) >> 59; he has hypoglycemia awareness at 100.  Highest sugar was 538 >> ... 251 >> 300 x2 >> 300s.   Glucometer: ReliOn  Pt's meals are: - Breakfast: skips - Lunch: may skip, PB or chicken - Dinner: chicken w/o sides or with + veggies + starch;  - Snacks: occasional sweets: Hershey bar, sweet tea, cool aid  -+ CKD stage IV- Dr Joshua [name], last BUN/creatinine:  Lab Results  Component Value Date  BUN 38 (H) 12/09/2022   BUN 25 (H) 12/05/2022   CREATININE 2.04 (H) 12/09/2022   CREATININE 1.47 (H) 12/05/2022  10/18/2019: 24/2.92 10/18/2019: ACR 765.3  Lab Results  Component Value Date   GFRAA 27 (L) 08/23/2019   GFRAA 25 (L) 08/22/2019   GFRAA 24 (L) 08/21/2019   GFRAA 22 (L) 08/20/2019   GFRAA 29 (L) 08/19/2019  Not on ACE inhibitor/ARB  -+ HL; last set of lipids: Lab Results  Component Value Date   CHOL 106 11/30/2022   HDL 43 11/30/2022   LDLCALC 33 11/30/2022   TRIG 151 (H) 11/30/2022   CHOLHDL 2.5 11/30/2022  09/05/2020: 112/137/38/49 10/18/2019: 143/105/53/69 On Crestor 40 - restarted 07/2021.  Previously on Zetia, also.  - last eye exam was in 04/2022: + DR, + possible glaucoma. Had cataract sx in 2014, 2015.  -+ numbness in his feet.   He also has significant back pain.  He sees neurology for peripheral neuropathy, leg pain, instability.  Now on gabapentin.  Last foot exam 01/2022.  On ASA 81.  Latest TSH:  Lab Results  Component Value Date   TSH 2.890 08/11/2021   Pt has FH of DM in [place].  He also has a history of HTN, OSA, GERD, esophageal ulcers. No h/o pancreatitis or FH of MTC.  He was in a play in [place]  ROS: See HPI  I reviewed pt's medications, allergies, PMH, social hx, family hx, and changes were documented in the history of present illness. Otherwise, unchanged from my initial visit note.  Past Medical History:  Diagnosis Date   Acute coronary syndrome (HCC)    Anemia    B12 deficiency    Chest pain    CHF (congestive heart failure) (HCC)    Chronic combined systolic and diastolic CHF (congestive heart failure) ([place])    a. 07/2015 Echo: EF 45-50%, mod LVH, mid apical/antsept AK, Gr 1 DD.   Chronic low back pain    CKD (chronic kidney disease), stage III (HCC)    Constipation    COPD (chronic obstructive pulmonary disease) (HCC)    Coronary artery disease    a. 2012 s/p MI/PCI: s/p DES to mid/dist RCA and overlapping DES to LAD;  b. 07/2015 Cath: LAD 10% ISR, D1 40(jailed), LCX 57m, OM2 30, OM3 30, RCA 61m ISR, 10d ISR; c. 07/2016 NSTEMI/PCI: LAD 85ost/p/m (3.5x20 Synergy DES overlapping prior stent), D1 40, LCX 58m, OM2 95 (2.75x16 Synergy DES), OM3 30, RCA 60m, 10d.   Depression    Diabetic gastroparesis ([place])    Diabetic polyneuropathy ([place])    DKA (diabetic ketoacidoses) 03/14/2018   GERD (gastroesophageal reflux disease)    Gout    Heart failure (HCC)    Heart murmur    History of MI (myocardial infarction)    Hyperlipemia    Hypertension    Hypertensive heart disease    Hypokalemia    Ischemic cardiomyopathy    a. 07/2015 Echo: EF 45-50%, mod LVH, mid-apicalanteroseptal AK, Gr1 DD.   Joint pain    Kidney problem    Morbid obesity ([place])    OSA on CPAP    Osteoarthritis     Osteomyelitis ([place])    a. 07/2016 MTP joint of R great toe s/p transmetatarsal amputation.   Other fatigue    Polyneuropathy in diabetes(357.2)    Pulmonary sarcoidosis ([place])    "problems w/it years ago; no problems anymore" (07/03/2014)   Rectal bleeding    Shortness of breath    Shortness  of breath on exertion    Sleep apnea    Stomach ulcer    Swallowing difficulty    Type II diabetes mellitus ([place])    Past Surgical History:  Procedure Laterality Date   ABDOMINAL AORTOGRAM W/LOWER EXTREMITY N/A 11/29/2022   Procedure: ABDOMINAL AORTOGRAM W/LOWER EXTREMITY;  Surgeon: Waynetta Sandy, MD;  Location: [facility];  Service: Cardiovascular;  Laterality: N/A;   AMPUTATION Right 07/24/2016   Procedure: TRANSMETATARSAL FOOT AMPUTATION;  Surgeon: [name], MD;  Location: [facility];  Service: Orthopedics;  Laterality: Right;   APPLICATION OF WOUND VAC Left 11/30/2022   Procedure: APPLICATION OF WOUND VAC;  Surgeon: Criselda Peaches, DPM;  Location: [facility];  Service: Podiatry;  Laterality: Left;   BALLOON DILATION N/A 03/21/2018   Procedure: BALLOON DILATION;  Surgeon: [name], MD;  Location: [facility];  Service: Endoscopy;  Laterality: N/A;   BIOPSY  03/16/2022   Procedure: BIOPSY;  Surgeon: [name], MD;  Location: [facility];  Service: Endoscopy;;   BREAST LUMPECTOMY Right ~ 2000   "benign"   CARDIAC CATHETERIZATION N/A 07/30/2015   Procedure: Left Heart Cath and Coronary Angiography;  Surgeon: Burnell Blanks, MD;  Location: [facility];  Service: Cardiovascular;  Laterality: N/A;   CARDIAC CATHETERIZATION N/A 07/19/2016   Procedure: Left Heart Cath and Coronary Angiography;  Surgeon: Belva Crome, MD;  Location: [facility];  Service: Cardiovascular;  Laterality: N/A;   CARDIAC CATHETERIZATION N/A 07/29/2016   Procedure: Coronary Stent Intervention;  Surgeon: Belva Crome, MD;  Location: [facility];  Service: Cardiovascular;   Laterality: N/A;   CARDIAC CATHETERIZATION N/A 07/29/2016   Procedure: Intravascular Ultrasound/IVUS;  Surgeon: Belva Crome, MD;  Location: [facility];  Service: Cardiovascular;  Laterality: N/A;   CATARACT EXTRACTION W/ INTRAOCULAR LENS  IMPLANT, BILATERAL Bilateral 2014-2015   COLONOSCOPY WITH PROPOFOL N/A 08/22/2017   Procedure: COLONOSCOPY WITH PROPOFOL;  Surgeon: [name], MD;  Location: [facility];  Service: Endoscopy;  Laterality: N/A;   COLONOSCOPY WITH PROPOFOL N/A 03/16/2022   Procedure: COLONOSCOPY WITH PROPOFOL;  Surgeon: [name], MD;  [facility];  Service: Endoscopy;  Laterality: N/A;   CORONARY ANGIOPLASTY WITH STENT PLACEMENT  10/31/2011   30 % MID ATRIOVENTRICULAR GROOVE CX STENOSIS. 90 % MID RCA.PTA TO LAD/STENTING OF TANDEM 60-70%. LAD STENOSIS 99% REDUCED TO 0%.3.0 X 32MM PROMUS DES POSTDILATED TO 3.25MM.   CORONARY ANGIOPLASTY WITH STENT PLACEMENT  07/03/2014   "2"   CORONARY STENT INTERVENTION N/A 04/09/2019   Procedure: CORONARY STENT INTERVENTION;  Surgeon: Jettie Booze, MD;  Location: [facility];  Service: Cardiovascular;  Laterality: N/A;   CORONARY STENT INTERVENTION N/A 08/22/2019   Procedure: CORONARY STENT INTERVENTION;  Surgeon: Martinique, Peter M, MD;  Location: [facility];  Service: Cardiovascular;  Laterality: N/A;   ESOPHAGOGASTRODUODENOSCOPY (EGD) WITH PROPOFOL N/A 03/21/2018   Procedure: ESOPHAGOGASTRODUODENOSCOPY (EGD) WITH PROPOFOL;  Surgeon: [name], MD;  Location: [facility];  Service: Endoscopy;  Laterality: N/A;   ESOPHAGOGASTRODUODENOSCOPY (EGD) WITH PROPOFOL N/A 03/16/2022   Procedure: ESOPHAGOGASTRODUODENOSCOPY (EGD) WITH PROPOFOL;  Surgeon: [name], MD;  [facility];  Service: Endoscopy;  Laterality: N/A;   GRAFT APPLICATION Left 97/67/3419   Procedure: GRAFT APPLICATION;  Surgeon: Criselda Peaches, DPM;  Location: [facility];  Service: Podiatry;  Laterality: Left;   I & D  EXTREMITY Right 10/30/2013   Procedure: IRRIGATION AND DEBRIDEMENT RIGHT SMALL FINGER POSSIBLE SECONDARY  WOUND CLOSURE;  Surgeon: [name], MD;  Location: [facility];  Service: Orthopedics;  Laterality: Right;  [name] is available after 4pm   I & D EXTREMITY Right 11/01/2013   Procedure:  IRRIGATION AND DEBRIDEMENT RIGHT  PROXIMAL PHALANGEAL LEVEL AMPUTATION;  Surgeon: [name], MD;  Location: [facility];  Service: Orthopedics;  Laterality: Right;   INCISION AND DRAINAGE Right 10/28/2013   Procedure: INCISION AND DRAINAGE Right small finger;  Surgeon: [name], MD;  Location: [facility];  Service: Orthopedics;  Laterality: Right;   INCISION AND DRAINAGE OF WOUND Left 11/07/2022   Procedure: IRRIGATION AND DEBRIDEMENT WOUND;  Surgeon: Yevonne Pax, DPM;  Location: [facility];  Service: Podiatry;  Laterality: Left;  Left foot ucler debridement and bone biopsy   LEFT HEART CATH Left 11/03/2011   Procedure: LEFT HEART CATH;  Surgeon: [name], MD;  Location: [facility];  Service: Cardiovascular;  Laterality: Left;   LEFT HEART CATH AND CORONARY ANGIOGRAPHY N/A 04/09/2019   Procedure: LEFT HEART CATH AND CORONARY ANGIOGRAPHY;  Surgeon: Jettie Booze, MD;  Location: [facility];  Service: Cardiovascular;  Laterality: N/A;   LEFT HEART CATHETERIZATION WITH CORONARY ANGIOGRAM N/A 07/03/2014   Procedure: LEFT HEART CATHETERIZATION WITH CORONARY ANGIOGRAM;  Surgeon: [name], MD;  Location: [facility] C[facility] LAB;  Service: Cardiovascular;  Laterality: N/A;   PERCUTANEOUS CORONARY STENT INTERVENTION (PCI-S) Left 11/03/2011   Procedure: PERCUTANEOUS CORONARY STENT INTERVENTION (PCI-S);  Surgeon: [name], MD;  Location: [facility] C[facility];  Service: Cardiovascular;  Laterality: Left;   RIGHT/LEFT HEART CATH AND CORONARY ANGIOGRAPHY N/A 08/22/2019   Procedure: RIGHT/LEFT HEART CATH AND CORONARY ANGIOGRAPHY;  Surgeon: Martinique, Peter M, MD;  Location: [facility];   Service: Cardiovascular;  Laterality: N/A;   TOE AMPUTATION Right ~ 2010   "2nd toe"   TOE AMPUTATION Right 2012   "3rd toe"   WOUND DEBRIDEMENT Left 11/30/2022   Procedure: DEBRIDEMENT WOUND;  Surgeon: Criselda Peaches, DPM;  Location: [facility];  Service: Podiatry;  Laterality: Left;   Social History   Socioeconomic History   Marital status: Married    Spouse name: Dorian Pod   Number of children: 3   Years of education: Not on file   Highest education level: Not on file  Occupational History    Comment: disabled  Tobacco Use   Smoking status: Never   Smokeless tobacco: Never  Vaping Use   Vaping Use: Never used  Substance and Sexual Activity   Alcohol use: Yes    Alcohol/week: 6.0 standard drinks of alcohol    Types: 6 Cans of beer per week    Comment: social binge drinking per patinet   Drug use: No   Sexual activity: Yes    Partners: Female  Other Topics Concern   Not on file  Social History Narrative   Not on file   Social Determinants of Health   Financial Resource Strain: Not on file  Food Insecurity: Food Insecurity Present (11/30/2022)   Hunger Vital Sign    Worried About Running Out of Food in the Last Year: Often true    Ran Out of Food in the Last Year: Often true  Transportation Needs: Unmet Transportation Needs (11/26/2022)   PRAPARE - Hydrologist (Medical): Yes    Lack of Transportation (Non-Medical): Yes  Physical Activity: Not on file  Stress: Not on file  Social Connections: Not on file  Intimate  Partner Violence: At Risk (11/26/2022)   Humiliation, Afraid, Rape, and Kick questionnaire    Fear of Current or Ex-Partner: No    Emotionally Abused: Yes    Physically Abused: No    Sexually Abused: No   Current Outpatient Medications on File Prior to Visit  Medication Sig Dispense Refill   allopurinol (ZYLOPRIM) 100 MG tablet Take 1 tablet (100 mg total) by mouth daily. 30 tablet 0   ascorbic acid (VITAMIN C) 500 MG tablet  Take 1 tablet (500 mg total) by mouth 2 (two) times daily. 60 tablet 0   aspirin EC 81 MG tablet Take 81 mg by mouth daily.      atorvastatin (LIPITOR) 80 MG tablet Take 80 mg by mouth daily.     ceFEPime (MAXIPIME) IVPB Inject 2 g into the vein every 12 (twelve) hours. Indication:  Pseudomonas aeruginosa osteomyelitis First Dose: Yes Last Day of Therapy:  01/13/23 Labs - Once weekly:  CBC/D and BMP, Labs - Every other week:  ESR and CRP Method of administration: IV Push Method of administration may be changed at the discretion of home infusion pharmacist based upon assessment of the patient and/or caregiver's ability to self-administer the medication ordered. 70 Units 0   Continuous Blood Gluc Receiver (FREESTYLE LIBRE 2 READER) DEVI See admin instructions.     Continuous Blood Gluc Sensor (FREESTYLE LIBRE 2 SENSOR) MISC APPLY TOPICALLY EVERY 14 DAYS 2 each 3   dapagliflozin propanediol (FARXIGA) 10 MG TABS tablet Take 10 mg by mouth daily.     famotidine (PEPCID) 20 MG tablet Take 20 mg by mouth at bedtime.     furosemide (LASIX) 40 MG tablet Take 1 tablet (40 mg total) by mouth 2 (two) times daily. 30 tablet    gabapentin (NEURONTIN) 100 MG capsule Take 1 capsule by mouth at bedtime 30 capsule 5   insulin aspart (NOVOLOG) 100 UNIT/ML injection Inject 0-9 Units into the skin 3 (three) times daily with meals. CBG < 70: treat for low blood sugar Implement  CBG 70 - 120: 0 units  CBG 121 - 150: 1 unit  CBG 151 - 200: 2 units  CBG 201 - 250: 3 units  CBG 251 - 300: 5 units  CBG 301 - 350: 7 units  CBG 351 - 400: 9 units  CBG > 400: call MD 10 mL 11   insulin aspart (NOVOLOG) 100 UNIT/ML injection Inject 3 Units into the skin 3 (three) times daily with meals. Hold for NPO or consuming less than 50% of meals 10 mL 11   insulin glargine (LANTUS SOLOSTAR) 100 UNIT/ML Solostar Pen Inject 15 Units into the skin 2 (two) times daily. Adjust as needed 45 mL 0   isosorbide mononitrate (IMDUR) 60 MG 24  hr tablet Take 3 tablets (180 mg total) by mouth daily. 90 tablet 11   latanoprost (XALATAN) 0.005 % ophthalmic solution Place 1 drop into both eyes at bedtime.     linagliptin (TRADJENTA) 5 MG TABS tablet Take 1 tablet (5 mg total) by mouth daily. 30 tablet 0   metoprolol succinate (TOPROL-XL) 25 MG 24 hr tablet Take 3 tablets (75 mg total) by mouth daily. 90 tablet 0   nitroGLYCERIN (NITROSTAT) 0.4 MG SL tablet Place 1 tablet (0.4 mg total) under the tongue every 5 (five) minutes as needed for chest pain. 25 tablet 3   pantoprazole (PROTONIX) 40 MG tablet TAKE 1 TABLET BY MOUTH TWICE DAILY BEFORE MEAL(S) (Patient taking differently: Take 40 mg by mouth  2 (two) times daily.) 180 tablet 0   polyethylene glycol powder (GLYCOLAX/MIRALAX) 17 GM/SCOOP powder Take 1 capful  (17 g) by mouth daily as needed for moderate constipation. 238 g 0   senna-docusate (SENOKOT-S) 8.6-50 MG tablet Take 1 tablet by mouth 2 (two) times daily. 60 tablet 0   tamsulosin (FLOMAX) 0.4 MG CAPS capsule Take 1 capsule (0.4 mg total) by mouth daily after supper. 30 capsule 0   ticagrelor (BRILINTA) 90 MG TABS tablet Take 1 tablet by mouth twice daily (Patient taking differently: Take 90 mg by mouth 2 (two) times daily.) 180 tablet 3   No current facility-administered medications on file prior to visit.   Allergies  Allergen Reactions   Chlorthalidone Other (See Comments)    Causes dehydration, kidneys shut down   Family History  Problem Relation Age of Onset   Sudden death Mother    Diabetes Maternal Grandmother    Hypertension Maternal Grandmother    Heart attack Maternal Aunt    Sudden death Other    PE: BP (!) 150/88 (BP Location: Left Arm, Patient Position: Sitting, Cuff Size: Normal)   Pulse 85   Ht $R'5\' 9"'ch$  (1.753 m)   SpO2 98%   BMI 40.57 kg/m  Wt Readings from Last 3 Encounters:  12/09/22 274 lb 11.1 oz (124.6 kg)  11/18/22 272 lb (123.4 kg)  11/16/22 278 lb 7.1 oz (126.3 kg)   Constitutional:  overweight, in NAD, in wheelchair Eyes: EOMI, no exophthalmos ENT: no thyromegaly, no cervical lymphadenopathy Cardiovascular: RRR, No MRG, + LE edema Respiratory: CTA B Musculoskeletal: + Deformities: Right transmetatarsal amputation Skin: no rashes Neurological: no tremor with outstretched hands  ASSESSMENT: 1. DM2, insulin-dependent, uncontrolled, with complications - CAD - s/p AMI 2012, s/p multiple PCI's; ischemic cardiomyopathy; CHF. Dr. Claiborne Billings - CKD stage 4 - DR - PN - s/p R transmetatarsal amputation - Gastroparesis - History of DKA  PLAN:  1. Patient with history of uncontrolled type 2 diabetes, on SGLT2 inhibitor, basal/bolus insulin regimen, and also DPP 4 and GLP-1 receptor agonist, now residing in a SNF mostly for physical therapy but also because he is homeless.  He is getting IV antibiotics there.  After his hospitalization in 11/25/2022, he has medication regimen has been changed so he is now on lower doses of Lantus compared to before, also on Tradjenta and sliding scale aspart.  He also continues [place]. -HbA1c obtained last month was higher than before, at 8.4%. -At today's visit, per review of his blood sugars at the facility, sugars are worse, up to 300s.  We discussed about increasing the dose of Lantus, resuming Ozempic and stopping Tradjenta, continuing the aspart sliding scale for now, but we may need to start mealtime aspart if sugars remain high.  I also advised him to continue [place] for now. - I suggested to:  Patient Instructions  Please change: - Lantus 20 units 2x a day  Resume: - Ozempic 1 mg weekly  Continue: - Aspart  sliding scale as using now - Farxiga 10 mg before   Stop: - Tradjenta  Please return in 3-4 months.  - advised to check sugars at different times of the day - 4x a day, rotating check times - he just received the CGM. - advised for yearly eye exams >> he is UTD - return to clinic in 3-4 months  2. HL -Reviewed latest lipid  panel from 11/2022: Fractions at goal: Lab Results  Component Value Date   CHOL 106 11/30/2022  HDL 43 11/30/2022   LDLCALC 33 11/30/2022   TRIG 151 (H) 11/30/2022   CHOLHDL 2.5 11/30/2022  -He continues on Crestor 40 mg daily, restarted in 07/2021.  No side effects.  3.  Obesity class III -He has large fluctuations in his weight, possibly contributed to by fluid status -I referred him to nutrition in the past that he was working with Antonieta Iba on improving his diet.  Barriers to improve nutrition: Increased beer intake, eating takeout frequently, sleeping only 5 hours a night. -We will restart Ozempic which should also help with weight loss -He was previously going to the weight management clinic but not anymore -He lost 29 pounds before the last 2 visits combined and 20 more since then  Philemon Kingdom, MD PhD [facility] Endocrinology

## 2022-12-28 NOTE — Patient Instructions (Addendum)
Please change: - Lantus 20 units 2x a day  Resume: - Ozempic 1 mg weekly  Continue: - Aspart  sliding scale as using now - Farxiga 10 mg before   Stop: - Tradjenta  Please return in 3-4 months.

## 2022-12-29 DIAGNOSIS — M6281 Muscle weakness (generalized): Secondary | ICD-10-CM | POA: Diagnosis not present

## 2022-12-29 DIAGNOSIS — S80811D Abrasion, right lower leg, subsequent encounter: Secondary | ICD-10-CM | POA: Diagnosis not present

## 2022-12-29 DIAGNOSIS — Z4889 Encounter for other specified surgical aftercare: Secondary | ICD-10-CM | POA: Diagnosis not present

## 2022-12-29 DIAGNOSIS — R239 Unspecified skin changes: Secondary | ICD-10-CM | POA: Diagnosis not present

## 2022-12-29 DIAGNOSIS — E11628 Type 2 diabetes mellitus with other skin complications: Secondary | ICD-10-CM | POA: Diagnosis not present

## 2022-12-29 LAB — FUNGUS CULTURE WITH STAIN

## 2022-12-29 LAB — FUNGAL ORGANISM REFLEX

## 2022-12-29 LAB — FUNGUS CULTURE RESULT

## 2022-12-31 DIAGNOSIS — E119 Type 2 diabetes mellitus without complications: Secondary | ICD-10-CM | POA: Diagnosis not present

## 2022-12-31 DIAGNOSIS — E785 Hyperlipidemia, unspecified: Secondary | ICD-10-CM | POA: Diagnosis not present

## 2022-12-31 DIAGNOSIS — N1832 Chronic kidney disease, stage 3b: Secondary | ICD-10-CM | POA: Diagnosis not present

## 2022-12-31 DIAGNOSIS — K219 Gastro-esophageal reflux disease without esophagitis: Secondary | ICD-10-CM | POA: Diagnosis not present

## 2022-12-31 DIAGNOSIS — G629 Polyneuropathy, unspecified: Secondary | ICD-10-CM | POA: Diagnosis not present

## 2022-12-31 DIAGNOSIS — R5381 Other malaise: Secondary | ICD-10-CM | POA: Diagnosis not present

## 2022-12-31 DIAGNOSIS — I10 Essential (primary) hypertension: Secondary | ICD-10-CM | POA: Diagnosis not present

## 2022-12-31 DIAGNOSIS — I5042 Chronic combined systolic (congestive) and diastolic (congestive) heart failure: Secondary | ICD-10-CM | POA: Diagnosis not present

## 2022-12-31 NOTE — Progress Notes (Signed)
Cardiology Office Note:    Date:  01/03/2023   ID:  Joshua [name], DOB October 06, 1956, MRN [id number]  PCP:  [name], MD (Inactive)   [facility] Providers Cardiologist:  [name], MD     Referring MD: [name], MD   Follow-up for CAD and chronic combined systolic and diastolic CHF.  Also in clinic today for preoperative cardiac evaluation  History of Present Illness:    Joshua [name] is a 67 y.o. male with a hx of coronary artery disease status post PCI with DES to his LAD and RCA in 2012.  He underwent subsequent PCI with DES to LAD and circumflex in 2017 and PCI to LAD for ISR 4/20, PCI with DES to, PCI with DES to P LAD 9/20, combined chronic CHF, HTN, HLD, type 2 diabetes, status post right metatarsal amputation in 2017, osteomyelitis, GERD, CKD stage IV, obstructive sleep apnea, and obesity.   He was seen and evaluated by Dr. Claiborne Billings 3/21.  At that time he was doing well from a cardiac standpoint.  He denied shortness of breath and chest discomfort.  His echocardiogram 4/20 showed an LVEF of 55-60% with intermediate diastolic dysfunction, suboptimal acoustic windows limited evaluation for wall motion abnormalities, and no significant valvular abnormalities.   He was seen by Roby Lofts, PA-C on 08/26/2020.  During that time he continued to do well from a cardiac standpoint.  He continued to have DOE however, it had improved since his previous cardiology visit.  He denied chest pain.  He reported that he had been out of Imdur for couple months with no issues and had restarted 120 mg daily.  He had lost around 30 pounds with dietary changes.  Who was congratulated on his weight loss.  He did note some dizziness/lightheadedness with waking up in the morning.  He also noted that he had been struggling somewhat with his CPAP mask.  It had been leaking and he was therefore not as compliant with his CPAP over the prior 2 months.   He was seen by Dr. Claiborne Billings 02/23/2021.   During that time he continued to deny chest discomfort.  He was meeting his compliance standards for his CPAP use and averaging 6 hours and 8 minutes of use per night.  He had received a new CPAP mask which had helped with fitting his face and leaks.   He presented to the clinic 08/11/2021 for follow-up evaluation stated over the past several days he had noticed left chest discomfort.  He had also noticed increased work of breathing with increased physical activity.  He reported that he had taken nitroglycerin 3 times over the past 3 weeks.  He reported that his nitroglycerin was old and that it took about 30 minutes for the medication to work.  His chest discomfort on all occasions came on at rest.  He did not notice chest discomfort with increased exertion.  He did not take his medications on the day of the visit.  His blood pressure was 152/76.  He reported that when he took his amlodipine Imdur, and metoprolol his blood pressure ran in the 654Y systolic.  Nephrology had been dosing his furosemide.  We reviewed the importance of low-sodium diet and fluid restriction.  Due to his CHF I will ordered an echocardiogram, give him a fluid restriction of no more than 64 ounces daily,  recommended that he chew sugar-free gum or sugar-free candy, I increased his Imdur to 180 mg daily, asked him to weigh  himself daily, and follow-up after his echocardiogram.  His chest discomfort was reproducible with deep palpation on exam.  His echocardiogram 08/19/2021 showed normal LVEF and G2 DD with no significant changes from previous exam/study.  He presented to the clinic 09/16/2021 for follow-up evaluation and stated he continued to drink excessive amounts of fluid.  He reported cravings for things such as tea and very cold drinks.  Since he was 67 years old he has had what he describes as increased amount of thirst.  [name] prescribed increased furosemide x1 week after he was seen in cardiology.  He did lose a significant  amount of weight.  He reported that he did urinate increased amount with increased diuretic medication.  He had not been weighing daily.  He reported some dietary indiscretion.  We discussed the importance of low-sodium diet and weight log.  I  instructed him to contact the office with a weight increase of 3 pounds overnight or 5 pounds in 1 week.  We  increased his furosemide to 160 mg twice daily x1 week and planned repeat  BMP in 1 week.  I gave him the salty 6 diet sheet, a weight log, and the  [place] support stocking sheet.  We planned follow-up in 1 month.  He presented to the clinic 10/26/21 for follow-up evaluation and stated he noticed some cramping after his last round of increased diuresis.  He reported that he did use some mustard which helped with his cramping.  His weight decreased down to 316 pounds and at visit he weighed 323 pounds.  He did have some mild generalized lower extremity swelling .  It appears his weight increase was related to actual weight gain.  He did note some lack of sensation in his bilateral lower extremities.  He reported that he had neuropathy and took gabapentin.  His blood pressure initially was 158/80 and on recheck it was 128/78.  He had not been taking his amlodipine for over a month.  We removed it from his medication list.  I asked him to maintain a blood pressure log, continue his fluid restriction, increase his physical activity as tolerated, continue heart healthy low-sodium diet, continue to work with [place] weight loss, ordered a BMP , and plan follow-up for 3 to 4 months with Dr. Claiborne Billings.  He presented to the clinic 3/23 for preoperative cardiac evaluation and follow-up evaluation.  He stated he felt well.  His biggest complaint was with regards to his stomach.  He had noticed increased constipation over the last several weeks.  He had been to GI who have asked him to increase his fiber and do MiraLAX daily.  He reported that he was somewhat sedentary but  was working on becoming more physically active.  2 months prior he noticed an episode of chest discomfort while he was at rest in the evening.  He did take nitroglycerin.  The chest discomfort went away after about 1 hour.  He denied chest pain with increased physical activity.  His chest discomfort seemed somewhat vague and atypical.  His EKG showed sinus rhythm with first-degree AV block possible left atrial enlargement 91 bpm.  He had not had any other episodes of chest discomfort.  We reviewed his upcoming colonoscopy.  I continued his medication regimen, had him increase his physical activity as tolerated and planned follow-up in 6 months.   He was admitted to the hospital on 11/26/2022 and discharged on 12/09/2022.  He presented with a left lateral foot poor  healing wound.  His wound was complicated by his neuropathy and diabetes.  Podiatry recommended BKA and patient declined.  He was hoping that his limb could be salvaged.  He underwent vascular consult for angiogram 12/18.  He was found to have a large dominant left posterior tibial artery and an occluded anterior tibial artery.  A wound VAC was placed.  He was placed on broad-spectrum antibiotics.  However, Pseudomonas grew on his bone Cx.  Infectious disease recommended 6 weeks of IV cefepime via PICC.  He was discharged to SNF on 12/08/2022.  Plan was made for follow-up with infectious disease and podiatry.  He had a witnessed syncopal episode on 12/27.  Cause was unclear.  He was talking to the chaplain and RN in the room when he lost consciousness.  He was unconscious for about 1 minute.  He was able to recover in about 15 seconds.  He complained of cloudiness in his head afterwards.  Orthostatics were negative.  EKG showed first-degree AV block, sinus rhythm.  EEG showed no seizures.  Head CT without acute findings.  Echocardiogram showed an EF of 55-60% and no regional wall motion abnormalities.  Cardiac event monitor was ordered but has not yet  resulted.  It was recommended that he not drive for at least 6 months.  He presents to the clinic today for follow-up evaluation and states he is frustrated with his current living situation and working with social work.  He continues to follow with podiatry for his left foot/ankle.  He reports compliance with his IV antibiotics.  He denies any further presyncope events.  He does note that he has had some days where he feels dizzy for most of the day.  Today he denies dizziness/lightheadedness.  We reviewed his recent hospitalization and he expressed understanding.  His cardiac event monitor results are not yet available.  Will plan follow-up in 4 to 6 months.  Today he denies chest pain, increased shortness of breath,  fatigue, palpitations, melena, hematuria, hemoptysis, diaphoresis, weakness, presyncope, syncope, orthopnea, and PND.    Past Medical History:  Diagnosis Date   Acute coronary syndrome (HCC)    Anemia    B12 deficiency    Chest pain    CHF (congestive heart failure) (HCC)    Chronic combined systolic and diastolic CHF (congestive heart failure) ([place])    a. 07/2015 Echo: EF 45-50%, mod LVH, mid apical/antsept AK, Gr 1 DD.   Chronic low back pain    CKD (chronic kidney disease), stage III (HCC)    Constipation    COPD (chronic obstructive pulmonary disease) (HCC)    Coronary artery disease    a. 2012 s/p MI/PCI: s/p DES to mid/dist RCA and overlapping DES to LAD;  b. 07/2015 Cath: LAD 10% ISR, D1 40(jailed), LCX 87m, OM2 30, OM3 30, RCA 56m ISR, 10d ISR; c. 07/2016 NSTEMI/PCI: LAD 85ost/p/m (3.5x20 Synergy DES overlapping prior stent), D1 40, LCX 35m, OM2 95 (2.75x16 Synergy DES), OM3 30, RCA 54m, 10d.   Depression    Diabetic gastroparesis ([place])    Diabetic polyneuropathy ([place])    DKA (diabetic ketoacidoses) 03/14/2018   GERD (gastroesophageal reflux disease)    Gout    Heart failure (HCC)    Heart murmur    History of MI (myocardial infarction)    Hyperlipemia     Hypertension    Hypertensive heart disease    Hypokalemia    Ischemic cardiomyopathy    a. 07/2015 Echo: EF 45-50%, mod LVH, mid-apicalanteroseptal  AK, Gr1 DD.   Joint pain    Kidney problem    Morbid obesity ([place])    OSA on CPAP    Osteoarthritis    Osteomyelitis ([place])    a. 07/2016 MTP joint of R great toe s/p transmetatarsal amputation.   Other fatigue    Polyneuropathy in diabetes(357.2)    Pulmonary sarcoidosis ([place])    "problems w/it years ago; no problems anymore" (07/03/2014)   Rectal bleeding    Shortness of breath    Shortness of breath on exertion    Sleep apnea    Stomach ulcer    Swallowing difficulty    Type II diabetes mellitus ([place])     Past Surgical History:  Procedure Laterality Date   ABDOMINAL AORTOGRAM W/LOWER EXTREMITY N/A 11/29/2022   Procedure: ABDOMINAL AORTOGRAM W/LOWER EXTREMITY;  Surgeon: Waynetta Sandy, MD;  Location: [facility];  Service: Cardiovascular;  Laterality: N/A;   AMPUTATION Right 07/24/2016   Procedure: TRANSMETATARSAL FOOT AMPUTATION;  Surgeon: [name], MD;  Location: [place];  Service: Orthopedics;  Laterality: Right;   APPLICATION OF WOUND VAC Left 11/30/2022   Procedure: APPLICATION OF WOUND VAC;  Surgeon: Criselda Peaches, DPM;  Location: [place] [facility];  Service: Podiatry;  Laterality: Left;   BALLOON DILATION N/A 03/21/2018   Procedure: BALLOON DILATION;  Surgeon: [name], MD;  Location: [facility];  Service: Endoscopy;  Laterality: N/A;   BIOPSY  03/16/2022   Procedure: BIOPSY;  Surgeon: [name], MD;  Location: [facility];  Service: Endoscopy;;   BREAST LUMPECTOMY Right ~ 2000   "benign"   CARDIAC CATHETERIZATION N/A 07/30/2015   Procedure: Left Heart Cath and Coronary Angiography;  Surgeon: Burnell Blanks, MD;  Location: [place] [facility];  Service: Cardiovascular;  Laterality: N/A;   CARDIAC CATHETERIZATION N/A 07/19/2016   Procedure: Left Heart Cath and Coronary Angiography;  Surgeon: Belva Crome, MD;  Location: [place] [facility];  Service: Cardiovascular;  Laterality: N/A;   CARDIAC CATHETERIZATION N/A 07/29/2016   Procedure: Coronary Stent Intervention;  Surgeon: Belva Crome, MD;  Location: [place] [facility];  Service: Cardiovascular;  Laterality: N/A;   CARDIAC CATHETERIZATION N/A 07/29/2016   Procedure: Intravascular Ultrasound/IVUS;  Surgeon: Belva Crome, MD;  Location: [place] [facility];  Service: Cardiovascular;  Laterality: N/A;   CATARACT EXTRACTION W/ INTRAOCULAR LENS  IMPLANT, BILATERAL Bilateral 2014-2015   COLONOSCOPY WITH PROPOFOL N/A 08/22/2017   Procedure: COLONOSCOPY WITH PROPOFOL;  Surgeon: [name], MD;  Location: [place];  Service: Endoscopy;  Laterality: N/A;   COLONOSCOPY WITH PROPOFOL N/A 03/16/2022   Procedure: COLONOSCOPY WITH PROPOFOL;  Surgeon: [name], MD;  [facility];  Service: Endoscopy;  Laterality: N/A;   CORONARY ANGIOPLASTY WITH STENT PLACEMENT  10/31/2011   30 % MID ATRIOVENTRICULAR GROOVE CX STENOSIS. 90 % MID RCA.PTA TO LAD/STENTING OF TANDEM 60-70%. LAD STENOSIS 99% REDUCED TO 0%.3.0 X 32MM PROMUS DES POSTDILATED TO 3.25MM.   CORONARY ANGIOPLASTY WITH STENT PLACEMENT  07/03/2014   "2"   CORONARY STENT INTERVENTION N/A 04/09/2019   Procedure: CORONARY STENT INTERVENTION;  Surgeon: Jettie Booze, MD;  Location: [place] [facility];  Service: Cardiovascular;  Laterality: N/A;   CORONARY STENT INTERVENTION N/A 08/22/2019   Procedure: CORONARY STENT INTERVENTION;  Surgeon: Martinique, Peter M, MD;  Location: [place] [facility];  Service: Cardiovascular;  Laterality: N/A;   ESOPHAGOGASTRODUODENOSCOPY (EGD) WITH PROPOFOL N/A 03/21/2018   Procedure: ESOPHAGOGASTRODUODENOSCOPY (EGD) WITH PROPOFOL;  Surgeon: Buccini,  Molly Maduro, MD;  Location: [facility];  Service: Endoscopy;  Laterality: N/A;   ESOPHAGOGASTRODUODENOSCOPY (EGD) WITH PROPOFOL N/A 03/16/2022   Procedure: ESOPHAGOGASTRODUODENOSCOPY (EGD) WITH PROPOFOL;   Surgeon: Charlott Rakes, MD;  [facility];  Service: Endoscopy;  Laterality: N/A;   GRAFT APPLICATION Left 11/30/2022   Procedure: GRAFT APPLICATION;  Surgeon: Edwin Cap, DPM;  Location: [facility];  Service: Podiatry;  Laterality: Left;   I & D EXTREMITY Right 10/30/2013   Procedure: IRRIGATION AND DEBRIDEMENT RIGHT SMALL FINGER POSSIBLE SECONDARY WOUND CLOSURE;  Surgeon: Marlowe Shores, MD;  Location: [facility];  Service: Orthopedics;  Laterality: Right;  Mina Marble is available after 4pm   I & D EXTREMITY Right 11/01/2013   Procedure:  IRRIGATION AND DEBRIDEMENT RIGHT  PROXIMAL PHALANGEAL LEVEL AMPUTATION;  Surgeon: Marlowe Shores, MD;  Location: [facility];  Service: Orthopedics;  Laterality: Right;   INCISION AND DRAINAGE Right 10/28/2013   Procedure: INCISION AND DRAINAGE Right small finger;  Surgeon: Marlowe Shores, MD;  Location: [facility];  Service: Orthopedics;  Laterality: Right;   INCISION AND DRAINAGE OF WOUND Left 11/07/2022   Procedure: IRRIGATION AND DEBRIDEMENT WOUND;  Surgeon: Pilar Plate, DPM;  [facility];  Service: Podiatry;  Laterality: Left;  Left foot ucler debridement and bone biopsy   LEFT HEART CATH Left 11/03/2011   Procedure: LEFT HEART CATH;  Surgeon: Lennette Bihari, MD;  Location: [facility] C[facility] LAB;  Service: Cardiovascular;  Laterality: Left;   LEFT HEART CATH AND CORONARY ANGIOGRAPHY N/A 04/09/2019   Procedure: LEFT HEART CATH AND CORONARY ANGIOGRAPHY;  Surgeon: Corky Crafts, MD;  Location: [facility] INVASIVE CV LAB;  Service: Cardiovascular;  Laterality: N/A;   LEFT HEART CATHETERIZATION WITH CORONARY ANGIOGRAM N/A 07/03/2014   Procedure: LEFT HEART CATHETERIZATION WITH CORONARY ANGIOGRAM;  Surgeon: Lesleigh Noe, MD;  Location: [facility] CATH LAB;  Service: Cardiovascular;  Laterality: N/A;   PERCUTANEOUS CORONARY STENT INTERVENTION (PCI-S) Left 11/03/2011   Procedure: PERCUTANEOUS CORONARY STENT INTERVENTION (PCI-S);  Surgeon: Lennette Bihari, MD;  Location[facility] C[facility];  Service: Cardiovascular;  Laterality: Left;   RIGHT/LEFT HEART CATH AND CORONARY ANGIOGRAPHY N/A 08/22/2019   Procedure: RIGHT/LEFT HEART CATH AND CORONARY ANGIOGRAPHY;  Surgeon: [place]az[place], Peter M, MD;  Location: [facility] INVASIVE CV LAB;  Service: Cardiovascular;  Laterality: N/A;   TOE AMPUTATION Right ~ 2010   "2nd toe"   TOE AMPUTATION Right 2012   "3rd toe"   WOUND DEBRIDEMENT Left 11/30/2022   Procedure: DEBRIDEMENT WOUND;  Surgeon: Edwin Cap, DPM;  Location: [facility];  Service: Podiatry;  Laterality: Left;    Current Medications: Current Meds  Medication Sig   allopurinol (ZYLOPRIM) 100 MG tablet Take 1 tablet (100 mg total) by mouth daily.   ascorbic acid (VITAMIN C) 500 MG tablet Take 1 tablet (500 mg total) by mouth 2 (two) times daily.   aspirin EC 81 MG tablet Take 81 mg by mouth daily.    atorvastatin (LIPITOR) 80 MG tablet Take 80 mg by mouth daily.   ceFEPime (MAXIPIME) IVPB Inject 2 g into the vein every 12 (twelve) hours. Indication:  Pseudomonas aeruginosa osteomyelitis First Dose: Yes Last Day of Therapy:  01/13/23 Labs - Once weekly:  CBC/D and BMP, Labs - Every other week:  ESR and CRP Method of administration: IV Push Method of administration may be changed at the discretion of home infusion pharmacist based upon assessment of the patient and/or caregiver's ability to self-administer the medication ordered.  Continuous Blood Gluc Receiver (FREESTYLE LIBRE 2 READER) DEVI See admin instructions.   Continuous Blood Gluc Sensor (FREESTYLE LIBRE 2 SENSOR) MISC APPLY TOPICALLY EVERY 14 DAYS   furosemide (LASIX) 40 MG tablet Take 1 tablet (40 mg total) by mouth 2 (two) times daily.   gabapentin (NEURONTIN) 100 MG capsule Take 1 capsule by mouth at bedtime   insulin glargine (LANTUS SOLOSTAR) 100 UNIT/ML Solostar Pen Inject 20 Units into the skin 2 (two) times daily. Adjust as needed   Insulin Lispro (HUMALOG IJ) Inject as directed.    isosorbide mononitrate (IMDUR) 60 MG 24 hr tablet Take 3 tablets (180 mg total) by mouth daily.   latanoprost (XALATAN) 0.005 % ophthalmic solution Place 1 drop into both eyes at bedtime.   linagliptin (TRADJENTA) 5 MG TABS tablet Take 5 mg by mouth daily.   metoprolol succinate (TOPROL-XL) 25 MG 24 hr tablet Take 3 tablets (75 mg total) by mouth daily.   nitroGLYCERIN (NITROSTAT) 0.4 MG SL tablet Place 1 tablet (0.4 mg total) under the tongue every 5 (five) minutes as needed for chest pain.   omeprazole (PRILOSEC) 20 MG capsule Take 20 mg by mouth daily.   polyethylene glycol powder (GLYCOLAX/MIRALAX) 17 GM/SCOOP powder Take 1 capful  (17 g) by mouth daily as needed for moderate constipation.   senna-docusate (SENOKOT-S) 8.6-50 MG tablet Take 1 tablet by mouth 2 (two) times daily.   tamsulosin (FLOMAX) 0.4 MG CAPS capsule Take 1 capsule (0.4 mg total) by mouth daily after supper.   ticagrelor (BRILINTA) 90 MG TABS tablet Take 1 tablet by mouth twice daily (Patient taking differently: Take 90 mg by mouth 2 (two) times daily.)   VITAMIN D, CHOLECALCIFEROL, PO Take by mouth.     Allergies:   Chlorthalidone   Social History   Socioeconomic History   Marital status: Married    Spouse name: Dorian Pod   Number of children: 3   Years of education: Not on file   Highest education level: Not on file  Occupational History    Comment: disabled  Tobacco Use   Smoking status: Never   Smokeless tobacco: Never  Vaping Use   Vaping Use: Never used  Substance and Sexual Activity   Alcohol use: Yes    Alcohol/week: 6.0 standard drinks of alcohol    Types: 6 Cans of beer per week    Comment: social binge drinking per patinet   Drug use: No   Sexual activity: Yes    Partners: Female  Other Topics Concern   Not on file  Social History Narrative   Not on file   Social Determinants of Health   Financial Resource Strain: Not on file  Food Insecurity: Food Insecurity Present (11/30/2022)   Hunger  Vital Sign    Worried About Running Out of Food in the Last Year: Often true    Ran Out of Food in the Last Year: Often true  Transportation Needs: Unmet Transportation Needs (11/26/2022)   PRAPARE - Hydrologist (Medical): Yes    Lack of Transportation (Non-Medical): Yes  Physical Activity: Not on file  Stress: Not on file  Social Connections: Not on file     Family History: The patient's family history includes Diabetes in his maternal grandmother; Heart attack in his maternal aunt; Hypertension in his maternal grandmother; Sudden death in his mother and another family member.  ROS:   Please see the history of present illness.     All other systems reviewed  and are negative.   Risk Assessment/Calculations:           Physical Exam:    VS:  BP 132/72   Pulse 82   SpO2 97%     Wt Readings from Last 3 Encounters:  12/09/22 274 lb 11.1 oz (124.6 kg)  11/18/22 272 lb (123.4 kg)  11/16/22 278 lb 7.1 oz (126.3 kg)     GEN:  Well nourished, well developed in no acute distress HEENT: Normal NECK: No JVD; No carotid bruits LYMPHATICS: No lymphadenopathy CARDIAC: RRR, no murmurs, rubs, gallops RESPIRATORY:  Clear to auscultation without rales, wheezing or rhonchi  ABDOMEN: Soft, non-tender, non-distended MUSCULOSKELETAL:  No edema; No deformity  SKIN: Warm and dry NEUROLOGIC:  Alert and oriented x 3 PSYCHIATRIC:  Normal affect    EKGs/Labs/Other Studies Reviewed:    The following studies were reviewed today:  Echocardiogram 04/07/2019   IMPRESSIONS     1. The left ventricle has normal systolic function, with an ejection  fraction of 55-60%. The cavity size was normal. Indeterminate diastolic  filling due to E-A fusion.   2. LV regional wall motion abnormalities could not be completely assessed  due to suboptimal acoustic windows. Grossly normal wall motion.   3. The inferior vena cava was dilated in size with <50% respiratory   variability.   FINDINGS   Left Ventricle: The left ventricle has normal systolic function, with an  ejection fraction of 55-60%. The cavity size was normal. There is no  increase in left ventricular wall thickness. Indeterminate diastolic  filling due to E-A fusion.      Right Ventricle: The right ventricle has normal systolic function. The  cavity was normal. There is right vetricular wall thickness was not  assessed.   Left Atrium: Left atrial size was not well visualized.   Right Atrium: Right atrial size was not well visualized. Right atrial  pressure is estimated at 15 mmHg.   Interatrial Septum: The interatrial septum was not well visualized.   Pericardium: There is no evidence of pericardial effusion.   Mitral Valve: The mitral valve is normal in structure. Mitral valve  regurgitation is not visualized by color flow Doppler.   Tricuspid Valve: Tricuspid valve regurgitation was not visualized by color  flow Doppler.   Aortic Valve: The aortic valve is normal in structure. Aortic valve  regurgitation was not visualized by color flow Doppler.   Pulmonic Valve: The pulmonic valve was grossly normal. Pulmonic valve  regurgitation is trivial by color flow Doppler.   Venous: The inferior vena cava is dilated in size with less than 50%  respiratory variability.   Echocardiogram 08/19/21 IMPRESSIONS     1. Left ventricular ejection fraction, by estimation, is 55 to 60%. The  left ventricle has low normal function. Left ventricular endocardial  border not optimally defined to evaluate regional wall motion. There is  mild concentric left ventricular  hypertrophy. Left ventricular diastolic parameters are consistent with  Grade II diastolic dysfunction (pseudonormalization).   2. Right ventricular systolic function is normal. The right ventricular  size is normal. Tricuspid regurgitation signal is inadequate for assessing  PA pressure.   3. Left atrial size was mild to  moderately dilated.   4. The mitral valve is normal in structure. No evidence of mitral valve  regurgitation. No evidence of mitral stenosis.   5. The aortic valve was not well visualized. Aortic valve regurgitation  is not visualized. No aortic stenosis is present.   6. The inferior vena  cava is normal in size with greater than 50%  respiratory variability, suggesting right atrial pressure of 3 mmHg.   Comparison(s): No significant change from prior study. Prior images  reviewed side by side.  Echocardiogram 12/09/2022 IMPRESSIONS     1. Left ventricular ejection fraction, by estimation, is 55 to 60%. The  left ventricle has normal function. The left ventricle has no regional  wall motion abnormalities. Left ventricular diastolic parameters were  normal.   2. Right ventricular systolic function is normal. The right ventricular  size is normal.   3. The mitral valve is normal in structure. No evidence of mitral valve  regurgitation. No evidence of mitral stenosis.   4. The aortic valve is normal in structure. Aortic valve regurgitation is  not visualized. No aortic stenosis is present.   5. The inferior vena cava is dilated in size with >50% respiratory  variability, suggesting right atrial pressure of 8 mmHg.   FINDINGS   Left Ventricle: Left ventricular ejection fraction, by estimation, is 55  to 60%. The left ventricle has normal function. The left ventricle has no  regional wall motion abnormalities. The left ventricular internal cavity  size was normal in size. There is   no left ventricular hypertrophy. Left ventricular diastolic parameters  were normal.   Right Ventricle: The right ventricular size is normal. No increase in  right ventricular wall thickness. Right ventricular systolic function is  normal.   Left Atrium: Left atrial size was normal in size.   Right Atrium: Right atrial size was normal in size.   Pericardium: There is no evidence of pericardial  effusion.   Mitral Valve: The mitral valve is normal in structure. Mild mitral annular  calcification. No evidence of mitral valve regurgitation. No evidence of  mitral valve stenosis. MV peak gradient, 9.6 mmHg. The mean mitral valve  gradient is 3.0 mmHg.   Tricuspid Valve: The tricuspid valve is normal in structure. Tricuspid  valve regurgitation is not demonstrated. No evidence of tricuspid  stenosis.   Aortic Valve: The aortic valve is normal in structure. Aortic valve  regurgitation is not visualized. No aortic stenosis is present.   Pulmonic Valve: The pulmonic valve was normal in structure. Pulmonic valve  regurgitation is not visualized. No evidence of pulmonic stenosis.   Aorta: The aortic root is normal in size and structure.   Venous: The inferior vena cava is dilated in size with greater than 50%  respiratory variability, suggesting right atrial pressure of 8 mmHg.   IAS/Shunts: No atrial level shunt detected by color flow Doppler.     EKG: None today. EKG 3/23 Sinus rhythm with first-degree AV block possible left atrial enlargement 91 bpm no ST or T wave deviation  EKG 09/11/2021 Normal sinus rhythm no ST or T wave deviation 82 bpm  Recent Labs: 11/26/2022: ALT 16 12/09/2022: BUN 38; Creatinine, Ser 2.04; Hemoglobin 9.7; Magnesium 2.1; Platelets 131; Potassium 3.4; Sodium 137  Recent Lipid Panel    Component Value Date/Time   CHOL 106 11/30/2022 0209   CHOL 283 (H) 08/11/2021 0929   TRIG 151 (H) 11/30/2022 0209   HDL 43 11/30/2022 0209   HDL 89 08/11/2021 0929   CHOLHDL 2.5 11/30/2022 0209   VLDL 30 11/30/2022 0209   LDLCALC 33 11/30/2022 0209   LDLCALC 169 (H) 08/11/2021 0929    ASSESSMENT & PLAN    1.Syncope-no further episodes of lightheadedness, presyncope or syncope.  Cardiac event monitor 12/09/2022 has not yet resulted.  Will reach  out to Dr. Claiborne Billings Change positions slowly Maintain hydration Lower extremity support stockings Order carotid  ultrasound  Chronic combined systolic and diastolic CHF-weight stable.  Stable chronic dyspnea.  Echo 11/19/2022 showed an EF of 23-53%, normal diastolic parameters.  Echocardiogram 08/19/2021 showed normal LVEF and G2 DD. Continue metoprolol, Imdur, furosemide, Farxiga Heart healthy low-sodium diet-salty 6 reviewed Increase physical activity as tolerated Lower extremity support stockings Daily weights-contact office with a weight increase of 2-3 pounds overnight or 5 pounds in 1 week Maintain weight log Maintain fluid restriction    Coronary artery disease-no chest pain today.  Denies recent episodes of chest tightness or pressure.  Underwent cardiac catheterization 4/20 showed widely patent LAD stent, 50% mid circumflex lesion, 40% proximal-mid RCA stenosis, ostial LAD-proximal LAD 90% stenosis.  Underwent scoring balloon angioplasty.  Proximal LAD 90% stenosed. Continue  metoprolol, aspirin, Brilinta, Imdur, Imdur Heart healthy low-sodium diet Maintain physical activity as tolerated   Essential hypertension-BP today 132/72 blood pressure at home 130s over 70s .  He continues to work with the weight loss clinic.  Continue metoprolol, Imdur,  Heart healthy low-sodium diet-salty 6 given Increase physical activity as tolerated Continue blood pressure log  Hyperlipidemia- 11/30/2022: Cholesterol 106; HDL 43; LDL Cholesterol 33; Triglycerides 151; VLDL 30 Continue aspirin, rosuvastatin Heart healthy low-sodium high-fiber diet Increase physical activity as tolerated Repeat fasting lipids and LFTs   OSA-continues compliance with CPAP.  Waking up fairly rested. Continue CPAP use      Disposition: Follow-up with Dr. Claiborne Billings  in 4-6 months      Medication Adjustments/Labs and Tests Ordered: Current medicines are reviewed at length with the patient today.  Concerns regarding medicines are outlined above.  No orders of the defined types were placed in this encounter.   No orders of the  defined types were placed in this encounter.    There are no Patient Instructions on file for this visit.   Signed, Deberah Pelton, NP  01/03/2023 11:07 AM       Notice: This dictation was prepared with Dragon dictation along with smaller phrase technology. Any transcriptional errors that result from this process are unintentional and may not be corrected upon review.  I spent 15 minutes examining this patient, reviewing medications, and using patient centered shared decision making involving her cardiac care.  Prior to her visit I spent greater than 20 minutes reviewing her past medical history,  medications, and prior cardiac tests.

## 2023-01-03 ENCOUNTER — Encounter: Payer: Self-pay | Admitting: General Practice

## 2023-01-03 ENCOUNTER — Ambulatory Visit: Payer: 59 | Attending: General Practice | Admitting: General Practice

## 2023-01-03 VITALS — BP 132/72 | HR 82

## 2023-01-03 DIAGNOSIS — G4733 Obstructive sleep apnea (adult) (pediatric): Secondary | ICD-10-CM | POA: Diagnosis not present

## 2023-01-03 DIAGNOSIS — I10 Essential (primary) hypertension: Secondary | ICD-10-CM

## 2023-01-03 DIAGNOSIS — E785 Hyperlipidemia, unspecified: Secondary | ICD-10-CM

## 2023-01-03 DIAGNOSIS — I25119 Atherosclerotic heart disease of native coronary artery with unspecified angina pectoris: Secondary | ICD-10-CM | POA: Diagnosis not present

## 2023-01-03 DIAGNOSIS — R55 Syncope and collapse: Secondary | ICD-10-CM | POA: Diagnosis not present

## 2023-01-03 DIAGNOSIS — I5042 Chronic combined systolic (congestive) and diastolic (congestive) heart failure: Secondary | ICD-10-CM

## 2023-01-03 NOTE — Patient Instructions (Addendum)
Medication Instructions:  The current medical regimen is effective;  continue present plan and medications as directed. Please refer to the Current Medication list given to you today.  *If you need a refill on your cardiac medications before your next appointment, please call your pharmacy*  Lab Work: NONE If you have labs (blood work) drawn today and your tests are completely normal, you will receive your results only by: [place] (if you have MyChart) OR A paper copy in the mail If you have any lab test that is abnormal or we need to change your treatment, we will call you to review the results.  Other Instructions MAINTAIN HYDRATION  CONTINUE REHAB  CALL IF ANY ISSUES WITH SOCIAL WORK   Testing Your physician has requested that you have a carotid duplex. This test is an ultrasound of the carotid arteries in your neck. It looks at blood flow through these arteries that supply the brain with blood. Allow one hour for this exam. There are no restrictions or special instructions.  Follow-Up: At [facility], you and your health needs are our priority.  As part of our continuing mission to provide you with exceptional heart care, we have created designated Provider Care Teams.  These Care Teams include your primary Cardiologist (physician) and Advanced Practice Providers (APPs -  Physician Assistants and Nurse Practitioners) who all work together to provide you with the care you need, when you need it.  Your next appointment:   4 month(s)  Provider:   [name], MD

## 2023-01-06 DIAGNOSIS — R51 Headache with orthostatic component, not elsewhere classified: Secondary | ICD-10-CM | POA: Diagnosis not present

## 2023-01-06 DIAGNOSIS — E7800 Pure hypercholesterolemia, unspecified: Secondary | ICD-10-CM | POA: Diagnosis not present

## 2023-01-06 DIAGNOSIS — I10 Essential (primary) hypertension: Secondary | ICD-10-CM | POA: Diagnosis not present

## 2023-01-07 DIAGNOSIS — Z4889 Encounter for other specified surgical aftercare: Secondary | ICD-10-CM | POA: Diagnosis not present

## 2023-01-07 DIAGNOSIS — E11628 Type 2 diabetes mellitus with other skin complications: Secondary | ICD-10-CM | POA: Diagnosis not present

## 2023-01-07 DIAGNOSIS — M6281 Muscle weakness (generalized): Secondary | ICD-10-CM | POA: Diagnosis not present

## 2023-01-07 DIAGNOSIS — S80811D Abrasion, right lower leg, subsequent encounter: Secondary | ICD-10-CM | POA: Diagnosis not present

## 2023-01-07 DIAGNOSIS — R239 Unspecified skin changes: Secondary | ICD-10-CM | POA: Diagnosis not present

## 2023-01-10 DIAGNOSIS — E1121 Type 2 diabetes mellitus with diabetic nephropathy: Secondary | ICD-10-CM | POA: Diagnosis not present

## 2023-01-10 DIAGNOSIS — J449 Chronic obstructive pulmonary disease, unspecified: Secondary | ICD-10-CM | POA: Diagnosis not present

## 2023-01-11 ENCOUNTER — Telehealth: Payer: Self-pay

## 2023-01-11 ENCOUNTER — Encounter: Payer: Self-pay | Admitting: Internal Medicine

## 2023-01-11 ENCOUNTER — Other Ambulatory Visit: Payer: Self-pay

## 2023-01-11 ENCOUNTER — Ambulatory Visit (INDEPENDENT_AMBULATORY_CARE_PROVIDER_SITE_OTHER): Payer: 59 | Admitting: Internal Medicine

## 2023-01-11 VITALS — BP 132/86 | HR 84 | Temp 98.1°F

## 2023-01-11 DIAGNOSIS — M869 Osteomyelitis, unspecified: Secondary | ICD-10-CM

## 2023-01-11 DIAGNOSIS — E11628 Type 2 diabetes mellitus with other skin complications: Secondary | ICD-10-CM | POA: Diagnosis not present

## 2023-01-11 DIAGNOSIS — L089 Local infection of the skin and subcutaneous tissue, unspecified: Secondary | ICD-10-CM | POA: Diagnosis not present

## 2023-01-11 NOTE — Telephone Encounter (Signed)
Per Dr. Gale Journey called [facility] for recent labs. Left voicemail requesting labs be faxed.  P [phone] [name], RMA

## 2023-01-11 NOTE — Progress Notes (Signed)
[facility] for Infectious Disease  Patient Active Problem List   Diagnosis Date Noted   Diabetic foot ulcer with osteomyelitis ([place]) 12/02/2022   Acute osteomyelitis of metatarsal bone of left foot ([place]) 11/30/2022   Left foot pain 11/26/2022   Protein-calorie malnutrition, severe 11/08/2022   Osteomyelitis ([place]) 11/06/2022   Diabetic foot infection ([place]) 11/06/2022   Osteomyelitis of fifth toe of left foot ([place]) 11/05/2022   (HFpEF) heart failure with preserved ejection fraction ([place]) 11/05/2022   CKD (chronic kidney disease), stage IV ([place]) 11/05/2022   Dysphagia 03/16/2022   History of colonic polyps 03/16/2022   Spinal stenosis of lumbar region 11/03/2021   Mixed diabetic hyperlipidemia associated with type 2 diabetes mellitus ([place]) 08/11/2021   Chronic low back pain    Hypokalemia 09/25/2020   Insulin dependent type 2 diabetes mellitus ([place]) 09/24/2020   Unstable angina ([place]) 08/17/2019   Cardiac chest pain 08/16/2019   NSTEMI (non-ST elevated myocardial infarction) ([place]) 04/06/2019   ACS (acute coronary syndrome) ([place]) 04/06/2019   Sepsis ([place]) 03/15/2018   DKA (diabetic ketoacidoses) 03/15/2018   Preop cardiovascular exam 08/22/2017   Acute renal failure superimposed on stage 3 chronic kidney disease ([place]) 05/19/2017   Essential hypertension    Status post transmetatarsal amputation of foot, right ([place]) 11/08/2016   Diabetic polyneuropathy associated with type 2 diabetes mellitus ([place]) 11/08/2016   Pure hypercholesterolemia    AKI (acute kidney injury) ([place])    Acute blood loss anemia    DM2 (diabetes mellitus, type 2) ([place])    Physical debility 08/03/2016   Chronic osteomyelitis of right foot ([place])    Cellulitis and abscess of leg, except foot    Non-healing wound of left lower extremity    Stage 3b chronic kidney disease (CKD) ([place])    Unstable angina pectoris ([place]) 07/18/2016   Chronic diastolic CHF (congestive heart failure) (HCC)    Chest pain  with moderate risk for cardiac etiology 06/24/2014   CAD S/P multiple PCIs 06/24/2014   Sleep apnea- on C-pap 06/24/2014   Class 3 severe obesity with serious comorbidity and body mass index (BMI) of 45.0 to 49.9 in adult [facility]) 11/10/2013   Hyperglycemia 10/29/2013   Hyperlipidemia 01/12/2011   Benign essential HTN 01/12/2011   GERD 01/12/2011   Acute osteomyelitis of hindfoot, left ([place]) 01/12/2011      Subjective:    Patient ID: Joshua [name], male    DOB: 1956-05-24, 67 y.o.   MRN: [id number]  Chief Complaint  Patient presents with   Follow-up     HPI:  Joshua [name] is a 67 y.o. male with gout, htn, hlp, dm2 here for follow up of diabetic foot ulcer/5th mt om  He was on doxycycline prior to recent admission underwent I&D and biopsy 11/25 that showed proteus He was discharged on doxy/augmentin  ------------ 11/18/22 id clinic f/u [place] concerns about how the wound looks and asked him to go to hospital... but he wants to check at our clinic first No f/c Compliant with doxy/augmentin; no n/v/diarrhea/rash He actually feels better than when he was in the hospital He is walking on his foot He has a wound vac since discharge but that came off accidentally last night. This morning is when the hh nurse first saw it and was concerned   12/15/22 id clinic f/u Patient since last visit 11/18/22 had the skin substitute graft failure, and ended up having repeat I&D and more proximal 5th mt amputation  on 12/19 --> cx pseudomonas He was discharged on opat for cefepime 2g q12hr for 6 weeks starting at 12/19 until 01/13/2023. He ended up going to SNF Reviewed snf mar and confirmed cefepime being given He has wound nurse visiting him twice a week at nursing home - dressing last changed today. Staples/sutures intact No f/c/n/v/diarrhea No pain There is some serous discharge He is avoiding putting weight on the left foot Will see podiatry again Tuesday next week   01/11/23 id  clinic f/u In nursing home On cefepime per mar until 01/13/23 Saw podiatry 1/10 -- sutures/staples removed; wound vac applied Has wound vac on foot No n/v/diarrhea/rash No f/c  No labs available today with snf package  Per patient, the wound is closing up -- no pus/smell  Blood tests last done a few days prior to this visit   Allergies  Allergen Reactions   Chlorthalidone Other (See Comments)    Causes dehydration, kidneys shut down      Outpatient Medications Prior to Visit  Medication Sig Dispense Refill   ceFEPime (MAXIPIME) IVPB Inject 2 g into the vein every 12 (twelve) hours. Indication:  Pseudomonas aeruginosa osteomyelitis First Dose: Yes Last Day of Therapy:  01/13/23 Labs - Once weekly:  CBC/D and BMP, Labs - Every other week:  ESR and CRP Method of administration: IV Push Method of administration may be changed at the discretion of home infusion pharmacist based upon assessment of the patient and/or caregiver's ability to self-administer the medication ordered. 70 Units 0   allopurinol (ZYLOPRIM) 100 MG tablet Take 1 tablet (100 mg total) by mouth daily. 30 tablet 0   ascorbic acid (VITAMIN C) 500 MG tablet Take 1 tablet (500 mg total) by mouth 2 (two) times daily. 60 tablet 0   aspirin EC 81 MG tablet Take 81 mg by mouth daily.      atorvastatin (LIPITOR) 80 MG tablet Take 80 mg by mouth daily.     Continuous Blood Gluc Receiver (FREESTYLE LIBRE 2 READER) DEVI See admin instructions.     Continuous Blood Gluc Sensor (FREESTYLE LIBRE 2 SENSOR) MISC APPLY TOPICALLY EVERY 14 DAYS 2 each 3   dapagliflozin propanediol (FARXIGA) 10 MG TABS tablet Take 1 tablet (10 mg total) by mouth daily before breakfast. 90 tablet 3   famotidine (PEPCID) 20 MG tablet Take 20 mg by mouth at bedtime. (Patient not taking: Reported on 01/03/2023)     furosemide (LASIX) 40 MG tablet Take 1 tablet (40 mg total) by mouth 2 (two) times daily. 30 tablet    gabapentin (NEURONTIN) 100 MG capsule  Take 1 capsule by mouth at bedtime 30 capsule 5   insulin aspart (NOVOLOG) 100 UNIT/ML injection Inject 0-9 Units into the skin 3 (three) times daily with meals. CBG < 70: treat for low blood sugar Implement  CBG 70 - 120: 0 units  CBG 121 - 150: 1 unit  CBG 151 - 200: 2 units  CBG 201 - 250: 3 units  CBG 251 - 300: 5 units  CBG 301 - 350: 7 units  CBG 351 - 400: 9 units  CBG > 400: call MD (Patient not taking: Reported on 01/03/2023) 10 mL 11   insulin aspart (NOVOLOG) 100 UNIT/ML injection Inject 3 Units into the skin 3 (three) times daily with meals. Hold for NPO or consuming less than 50% of meals (Patient not taking: Reported on 01/03/2023) 10 mL 11   insulin glargine (LANTUS SOLOSTAR) 100 UNIT/ML Solostar Pen Inject 20 Units  into the skin 2 (two) times daily. Adjust as needed 45 mL 3   Insulin Lispro (HUMALOG IJ) Inject as directed.     isosorbide mononitrate (IMDUR) 60 MG 24 hr tablet Take 3 tablets (180 mg total) by mouth daily. 90 tablet 11   latanoprost (XALATAN) 0.005 % ophthalmic solution Place 1 drop into both eyes at bedtime.     linagliptin (TRADJENTA) 5 MG TABS tablet Take 5 mg by mouth daily.     metoprolol succinate (TOPROL-XL) 25 MG 24 hr tablet Take 3 tablets (75 mg total) by mouth daily. 90 tablet 0   nitroGLYCERIN (NITROSTAT) 0.4 MG SL tablet Place 1 tablet (0.4 mg total) under the tongue every 5 (five) minutes as needed for chest pain. 25 tablet 3   omeprazole (PRILOSEC) 20 MG capsule Take 20 mg by mouth daily.     pantoprazole (PROTONIX) 40 MG tablet TAKE 1 TABLET BY MOUTH TWICE DAILY BEFORE MEAL(S) (Patient not taking: Reported on 01/03/2023) 180 tablet 0   polyethylene glycol powder (GLYCOLAX/MIRALAX) 17 GM/SCOOP powder Take 1 capful  (17 g) by mouth daily as needed for moderate constipation. 238 g 0   Semaglutide, 1 MG/DOSE, (OZEMPIC, 1 MG/DOSE,) 4 MG/3ML SOPN Inject 1 mg into the skin once a week. (Patient not taking: Reported on 01/03/2023) 9 mL 3   senna-docusate  (SENOKOT-S) 8.6-50 MG tablet Take 1 tablet by mouth 2 (two) times daily. 60 tablet 0   ticagrelor (BRILINTA) 90 MG TABS tablet Take 1 tablet by mouth twice daily (Patient taking differently: Take 90 mg by mouth 2 (two) times daily.) 180 tablet 3   VITAMIN D, CHOLECALCIFEROL, PO Take by mouth.     No facility-administered medications prior to visit.     Social History   Socioeconomic History   Marital status: Married    Spouse name: Alvino Chapel   Number of children: 3   Years of education: Not on file   Highest education level: Not on file  Occupational History    Comment: disabled  Tobacco Use   Smoking status: Never   Smokeless tobacco: Never  Vaping Use   Vaping Use: Never used  Substance and Sexual Activity   Alcohol use: Yes    Alcohol/week: 6.0 standard drinks of alcohol    Types: 6 Cans of beer per week    Comment: social binge drinking per patinet   Drug use: No   Sexual activity: Yes    Partners: Female  Other Topics Concern   Not on file  Social History Narrative   Not on file   Social Determinants of Health   Financial Resource Strain: Not on file  Food Insecurity: Food Insecurity Present (11/30/2022)   Hunger Vital Sign    Worried About Running Out of Food in the Last Year: Often true    Ran Out of Food in the Last Year: Often true  Transportation Needs: Unmet Transportation Needs (11/26/2022)   PRAPARE - Administrator, Civil Service (Medical): Yes    Lack of Transportation (Non-Medical): Yes  Physical Activity: Not on file  Stress: Not on file  Social Connections: Not on file  Intimate Partner Violence: At Risk (11/26/2022)   Humiliation, Afraid, Rape, and Kick questionnaire    Fear of Current or Ex-Partner: No    Emotionally Abused: Yes    Physically Abused: No    Sexually Abused: No      Review of Systems    All other ros negative   Objective:  BP (!) 152/87   Pulse 84   Temp 98.1 F (36.7 C) (Oral)   SpO2 99%  Nursing note  and vital signs reviewed.  Physical Exam     General/constitutional: no distress, pleasant; patient in wheel chair HEENT: Normocephalic, PER, Conj Clear, EOMI, Oropharynx clear Neck supple CV: rrr no mrg Lungs: clear to auscultation, normal respiratory effort Abd: Soft, Nontender Ext: no edema Skin: No Rash Neuro: nonfocal MSK: left foot in wound vac; no swelling/warmth/tenderness in surrounding     Labs: Lab Results  Component Value Date   WBC 6.5 12/09/2022   HGB 9.7 (L) 12/09/2022   HCT 30.3 (L) 12/09/2022   MCV 88.1 12/09/2022   PLT 131 (L) 63/78/5885   Last metabolic panel Lab Results  Component Value Date   GLUCOSE 131 (H) 12/09/2022   NA 137 12/09/2022   K 3.4 (L) 12/09/2022   CL 108 12/09/2022   CO2 24 12/09/2022   BUN 38 (H) 12/09/2022   CREATININE 2.04 (H) 12/09/2022   GFRNONAA 36 (L) 12/09/2022   CALCIUM 8.4 (L) 12/09/2022   PHOS 2.2 (L) 03/20/2018   PROT 7.6 11/26/2022   ALBUMIN 2.4 (L) 11/26/2022   LABGLOB 3.3 08/11/2021   AGRATIO 1.2 08/11/2021   BILITOT 0.5 11/26/2022   ALKPHOS 130 (H) 11/26/2022   AST 21 11/26/2022   ALT 16 11/26/2022   ANIONGAP 5 12/09/2022   Crp: 11/25     13.1 (<1)    Micro:  Serology:  Imaging:  Assessment & Plan:   Problem List Items Addressed This Visit       Endocrine   Diabetic foot infection ([place]) - Primary     Musculoskeletal and Integument   Osteomyelitis ([place])     Abx: 12/21-c cefepime  12/15-12/21 linezolid/ceftriaxone/flagyl 11/27-12/15 doxy/augmentin  11/24-27 vanc/piptazo     Outpatient doxycycline                                                     Assessment: 67 yo male basically homeless/poor living situation, dm2, ckd4, cad, admitted 11/24 for chronic left foot ulcer with osteomyelitis   Mri imaging 5th mt osteomyelitis, however this would need to be confirmed by a pathologic evaluation as false positive read rate is high   He underwent bone biopsy/culture of the 5th mt on  11/26 growing non-esbl proteus.   As he was taking doxycycline up until the admission, we continued doxycycline and added amox-clav with plan to do 4-6 weeks of treatment starting 11/24  Today 11/18/22 will repeat cbc//cmp/crp It is too early to say but no clinically change grossly; it actually appears better. We will plan to see him again in around 4 weeks  There is no sign of sepsis or worsening infection. I feel he can continue antibiotics, but he can go ahead and asks his podiatrist on what to do with the wound vac    Continue doxy/amox-clav. Tentative end date 12/17/2022   12/15/2022 assessment Continue cefepime until 2/1 then remove picc Asked nursing home to do weekly cbc, cmp, and crp and fax to our clinic F/u with me 4 weeks F/u podiatry Continue off loading   01/11/2023 id assessment Finished 6 weeks cefepime on 2/01 Bp high and seeing cardiology Will review snf labs when available  See me in another 4-5 weeks to recheck labs off of antibiotics  F/u podiatry on 2/07 as discussed with them previously  Picc line can be removed at nursing home on 2/01. We'll stop antibiotics then     Follow-up: Return in about 5 weeks (around 02/15/2023).      Jabier Mutton, [facility] for Infectious Disease [facility] 01/11/2023, 2:05 PM

## 2023-01-11 NOTE — Patient Instructions (Signed)
Finish cefepime on 2/1 and picc can be removed  Make sure you follow podiatry wound care and off loading instruction.  Risk for reinfection is high  See me again in aound 4-5 weeks.

## 2023-01-12 DIAGNOSIS — R239 Unspecified skin changes: Secondary | ICD-10-CM | POA: Diagnosis not present

## 2023-01-12 DIAGNOSIS — I10 Essential (primary) hypertension: Secondary | ICD-10-CM | POA: Diagnosis not present

## 2023-01-12 DIAGNOSIS — G629 Polyneuropathy, unspecified: Secondary | ICD-10-CM | POA: Diagnosis not present

## 2023-01-12 DIAGNOSIS — E119 Type 2 diabetes mellitus without complications: Secondary | ICD-10-CM | POA: Diagnosis not present

## 2023-01-12 DIAGNOSIS — K219 Gastro-esophageal reflux disease without esophagitis: Secondary | ICD-10-CM | POA: Diagnosis not present

## 2023-01-12 DIAGNOSIS — M109 Gout, unspecified: Secondary | ICD-10-CM | POA: Diagnosis not present

## 2023-01-12 DIAGNOSIS — S80811D Abrasion, right lower leg, subsequent encounter: Secondary | ICD-10-CM | POA: Diagnosis not present

## 2023-01-12 DIAGNOSIS — Z4889 Encounter for other specified surgical aftercare: Secondary | ICD-10-CM | POA: Diagnosis not present

## 2023-01-12 DIAGNOSIS — R5381 Other malaise: Secondary | ICD-10-CM | POA: Diagnosis not present

## 2023-01-12 DIAGNOSIS — N1832 Chronic kidney disease, stage 3b: Secondary | ICD-10-CM | POA: Diagnosis not present

## 2023-01-12 DIAGNOSIS — I5042 Chronic combined systolic (congestive) and diastolic (congestive) heart failure: Secondary | ICD-10-CM | POA: Diagnosis not present

## 2023-01-12 DIAGNOSIS — M6281 Muscle weakness (generalized): Secondary | ICD-10-CM | POA: Diagnosis not present

## 2023-01-12 DIAGNOSIS — E11628 Type 2 diabetes mellitus with other skin complications: Secondary | ICD-10-CM | POA: Diagnosis not present

## 2023-01-12 NOTE — Telephone Encounter (Signed)
Called SNF again for recent labs to be faxed to office. Spoke with RN who will results today. [name], RMA

## 2023-01-13 ENCOUNTER — Ambulatory Visit (HOSPITAL_COMMUNITY)
Admission: RE | Admit: 2023-01-13 | Payer: 59 | Source: Ambulatory Visit | Attending: General Practice | Admitting: General Practice

## 2023-01-13 DIAGNOSIS — N1832 Chronic kidney disease, stage 3b: Secondary | ICD-10-CM | POA: Diagnosis not present

## 2023-01-13 DIAGNOSIS — G629 Polyneuropathy, unspecified: Secondary | ICD-10-CM | POA: Diagnosis not present

## 2023-01-13 DIAGNOSIS — R5381 Other malaise: Secondary | ICD-10-CM | POA: Diagnosis not present

## 2023-01-13 DIAGNOSIS — I5042 Chronic combined systolic (congestive) and diastolic (congestive) heart failure: Secondary | ICD-10-CM | POA: Diagnosis not present

## 2023-01-13 DIAGNOSIS — E119 Type 2 diabetes mellitus without complications: Secondary | ICD-10-CM | POA: Diagnosis not present

## 2023-01-13 DIAGNOSIS — I10 Essential (primary) hypertension: Secondary | ICD-10-CM | POA: Diagnosis not present

## 2023-01-13 DIAGNOSIS — M109 Gout, unspecified: Secondary | ICD-10-CM | POA: Diagnosis not present

## 2023-01-13 DIAGNOSIS — K219 Gastro-esophageal reflux disease without esophagitis: Secondary | ICD-10-CM | POA: Diagnosis not present

## 2023-01-18 ENCOUNTER — Ambulatory Visit (HOSPITAL_COMMUNITY)
Admission: RE | Admit: 2023-01-18 | Discharge: 2023-01-18 | Disposition: A | Discharge status: Home / Self Care | Payer: 59 | Source: Ambulatory Visit | Attending: Internal Medicine | Admitting: Internal Medicine

## 2023-01-18 DIAGNOSIS — R55 Syncope and collapse: Secondary | ICD-10-CM | POA: Diagnosis present

## 2023-01-19 ENCOUNTER — Ambulatory Visit (INDEPENDENT_AMBULATORY_CARE_PROVIDER_SITE_OTHER): Payer: 59 | Admitting: Podiatry

## 2023-01-19 DIAGNOSIS — E11628 Type 2 diabetes mellitus with other skin complications: Secondary | ICD-10-CM | POA: Diagnosis not present

## 2023-01-19 DIAGNOSIS — Z4889 Encounter for other specified surgical aftercare: Secondary | ICD-10-CM | POA: Diagnosis not present

## 2023-01-19 DIAGNOSIS — S80811D Abrasion, right lower leg, subsequent encounter: Secondary | ICD-10-CM | POA: Diagnosis not present

## 2023-01-19 DIAGNOSIS — L97522 Non-pressure chronic ulcer of other part of left foot with fat layer exposed: Secondary | ICD-10-CM | POA: Diagnosis not present

## 2023-01-19 DIAGNOSIS — M6281 Muscle weakness (generalized): Secondary | ICD-10-CM | POA: Diagnosis not present

## 2023-01-19 DIAGNOSIS — R239 Unspecified skin changes: Secondary | ICD-10-CM | POA: Diagnosis not present

## 2023-01-19 NOTE — Patient Instructions (Signed)
OK to begin weightbearing toe touch in AFO brace. Discontinue wound VAC. Start dressing daily or every other day with collagen (such as Prisma) and Hydrofera blue, silicone foam border dressing.

## 2023-01-20 DIAGNOSIS — I5032 Chronic diastolic (congestive) heart failure: Secondary | ICD-10-CM | POA: Diagnosis not present

## 2023-01-20 DIAGNOSIS — N2581 Secondary hyperparathyroidism of renal origin: Secondary | ICD-10-CM | POA: Diagnosis not present

## 2023-01-20 DIAGNOSIS — I129 Hypertensive chronic kidney disease with stage 1 through stage 4 chronic kidney disease, or unspecified chronic kidney disease: Secondary | ICD-10-CM | POA: Diagnosis not present

## 2023-01-20 DIAGNOSIS — M869 Osteomyelitis, unspecified: Secondary | ICD-10-CM | POA: Diagnosis not present

## 2023-01-20 DIAGNOSIS — N183 Chronic kidney disease, stage 3 unspecified: Secondary | ICD-10-CM | POA: Diagnosis not present

## 2023-01-20 DIAGNOSIS — N184 Chronic kidney disease, stage 4 (severe): Secondary | ICD-10-CM | POA: Diagnosis not present

## 2023-01-20 DIAGNOSIS — D631 Anemia in chronic kidney disease: Secondary | ICD-10-CM | POA: Diagnosis not present

## 2023-01-21 ENCOUNTER — Telehealth: Payer: Self-pay | Admitting: *Deleted

## 2023-01-21 NOTE — Telephone Encounter (Signed)
Calling for orders for patient, faxed updated notes/orders from 02/07 to [facility]-01/21/23, attn: Vee

## 2023-01-23 ENCOUNTER — Encounter: Payer: Self-pay | Admitting: Podiatry

## 2023-01-23 NOTE — Progress Notes (Signed)
  Subjective:  Patient ID: Joshua [name], male    DOB: Nov 04, 1956,  MRN: [id number]  Chief Complaint  Patient presents with   Foot Ulcer    3 week follow up left foot ulcer      67 y.o. male returns for post-op check.  Overall doing okay he is at facility they have been using the wound VAC  Review of Systems: Negative except as noted in the HPI. Denies N/V/F/Ch.   Objective:  There were no vitals filed for this visit. There is no height or weight on file to calculate BMI. Constitutional Well developed. Well nourished.  Vascular Foot warm and well perfused. Capillary refill normal to all digits.  Calf is soft and supple, no posterior calf or knee pain, negative Homans' sign  Neurologic Normal speech. Oriented to person, place, and time. Epicritic sensation to light touch grossly absent bilaterally.  Dermatologic Full-thickness wound remains measures 4.5 x 3.5 x 0.3 cm, no signs infection, fibrogranular wound bed.  Minimal biofilm.  Some hyperkeratosis surrounding.  Slight macerated.  Orthopedic: He has now tenderness to palpation noted about the surgical site.      Assessment:   1. Ulcer of left foot with fat layer exposed ([place])    Plan:  Patient was evaluated and treated and all questions answered.  S/p foot surgery left -Overall doing very well and wound bed has good granular tape with no exposed bone tendon or joint currently.  I believe this point we may discontinue the wound VAC and begin applying collagen dressings which they will do at the facility and orders for this were written on his paperwork today.  Change every other day.  Today full-thickness excisional debridement was completed to the subcutaneous layer utilizing a sharp scalpel.  Postdebridement are noted above.  Pleased overall with healing so far.  He may begin toe-touch weightbearing at this point.  Hopefully transition to full weightbearing in CAM boot after next visit.  Return in about 4 weeks (around  02/16/2023) for wound care.

## 2023-01-24 ENCOUNTER — Encounter: Payer: Self-pay | Admitting: Adult Health

## 2023-01-24 ENCOUNTER — Ambulatory Visit (INDEPENDENT_AMBULATORY_CARE_PROVIDER_SITE_OTHER): Payer: 59 | Admitting: Adult Health

## 2023-01-24 VITALS — BP 140/69 | HR 86 | Ht 69.0 in | Wt 260.0 lb

## 2023-01-24 DIAGNOSIS — E1142 Type 2 diabetes mellitus with diabetic polyneuropathy: Secondary | ICD-10-CM
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
## 2023-01-25 DIAGNOSIS — H40053 Ocular hypertension, bilateral: Secondary | ICD-10-CM | POA: Diagnosis not present

## 2023-01-27 ENCOUNTER — Telehealth: Payer: Self-pay | Admitting: *Deleted

## 2023-01-27 DIAGNOSIS — Z4889 Encounter for other specified surgical aftercare: Secondary | ICD-10-CM | POA: Diagnosis not present

## 2023-01-27 DIAGNOSIS — R239 Unspecified skin changes: Secondary | ICD-10-CM | POA: Diagnosis not present

## 2023-01-27 DIAGNOSIS — M6281 Muscle weakness (generalized): Secondary | ICD-10-CM | POA: Diagnosis not present

## 2023-01-27 DIAGNOSIS — E11628 Type 2 diabetes mellitus with other skin complications: Secondary | ICD-10-CM | POA: Diagnosis not present

## 2023-01-27 NOTE — Telephone Encounter (Signed)
Error

## 2023-01-28 DIAGNOSIS — J449 Chronic obstructive pulmonary disease, unspecified: Secondary | ICD-10-CM | POA: Diagnosis not present

## 2023-01-28 DIAGNOSIS — E1121 Type 2 diabetes mellitus with diabetic nephropathy: Secondary | ICD-10-CM | POA: Diagnosis not present

## 2023-01-31 DIAGNOSIS — K029 Dental caries, unspecified: Secondary | ICD-10-CM | POA: Diagnosis not present

## 2023-02-04 DIAGNOSIS — M6281 Muscle weakness (generalized): Secondary | ICD-10-CM | POA: Diagnosis not present

## 2023-02-04 DIAGNOSIS — E11628 Type 2 diabetes mellitus with other skin complications: Secondary | ICD-10-CM | POA: Diagnosis not present

## 2023-02-04 DIAGNOSIS — Z4889 Encounter for other specified surgical aftercare: Secondary | ICD-10-CM | POA: Diagnosis not present

## 2023-02-04 DIAGNOSIS — R239 Unspecified skin changes: Secondary | ICD-10-CM | POA: Diagnosis not present

## 2023-02-06 ENCOUNTER — Other Ambulatory Visit: Payer: Self-pay | Admitting: Cardiovascular Disease

## 2023-02-07 DIAGNOSIS — N184 Chronic kidney disease, stage 4 (severe): Secondary | ICD-10-CM | POA: Diagnosis not present

## 2023-02-07 DIAGNOSIS — E113493 Type 2 diabetes mellitus with severe nonproliferative diabetic retinopathy without macular edema, bilateral: Secondary | ICD-10-CM | POA: Diagnosis not present

## 2023-02-07 DIAGNOSIS — I5022 Chronic systolic (congestive) heart failure: Secondary | ICD-10-CM | POA: Diagnosis not present

## 2023-02-07 DIAGNOSIS — K219 Gastro-esophageal reflux disease without esophagitis: Secondary | ICD-10-CM | POA: Diagnosis not present

## 2023-02-07 DIAGNOSIS — I2510 Atherosclerotic heart disease of native coronary artery without angina pectoris: Secondary | ICD-10-CM | POA: Diagnosis not present

## 2023-02-07 DIAGNOSIS — I10 Essential (primary) hypertension: Secondary | ICD-10-CM | POA: Diagnosis not present

## 2023-02-07 DIAGNOSIS — E785 Hyperlipidemia, unspecified: Secondary | ICD-10-CM | POA: Diagnosis not present

## 2023-02-09 DIAGNOSIS — M86172 Other acute osteomyelitis, left ankle and foot: Secondary | ICD-10-CM | POA: Diagnosis not present

## 2023-02-09 DIAGNOSIS — E119 Type 2 diabetes mellitus without complications: Secondary | ICD-10-CM | POA: Diagnosis not present

## 2023-02-09 DIAGNOSIS — M109 Gout, unspecified: Secondary | ICD-10-CM | POA: Diagnosis not present

## 2023-02-09 DIAGNOSIS — I5042 Chronic combined systolic (congestive) and diastolic (congestive) heart failure: Secondary | ICD-10-CM | POA: Diagnosis not present

## 2023-02-09 DIAGNOSIS — K219 Gastro-esophageal reflux disease without esophagitis: Secondary | ICD-10-CM | POA: Diagnosis not present

## 2023-02-09 DIAGNOSIS — E11628 Type 2 diabetes mellitus with other skin complications: Secondary | ICD-10-CM | POA: Diagnosis not present

## 2023-02-09 DIAGNOSIS — R239 Unspecified skin changes: Secondary | ICD-10-CM | POA: Diagnosis not present

## 2023-02-09 DIAGNOSIS — Z4889 Encounter for other specified surgical aftercare: Secondary | ICD-10-CM | POA: Diagnosis not present

## 2023-02-09 DIAGNOSIS — M6281 Muscle weakness (generalized): Secondary | ICD-10-CM | POA: Diagnosis not present

## 2023-02-09 DIAGNOSIS — E785 Hyperlipidemia, unspecified: Secondary | ICD-10-CM | POA: Diagnosis not present

## 2023-02-09 DIAGNOSIS — I10 Essential (primary) hypertension: Secondary | ICD-10-CM | POA: Diagnosis not present

## 2023-02-16 ENCOUNTER — Ambulatory Visit (INDEPENDENT_AMBULATORY_CARE_PROVIDER_SITE_OTHER): Payer: 59 | Admitting: Podiatry

## 2023-02-16 DIAGNOSIS — E11628 Type 2 diabetes mellitus with other skin complications: Secondary | ICD-10-CM | POA: Diagnosis not present

## 2023-02-16 DIAGNOSIS — L97522 Non-pressure chronic ulcer of other part of left foot with fat layer exposed: Secondary | ICD-10-CM | POA: Diagnosis not present

## 2023-02-16 DIAGNOSIS — R239 Unspecified skin changes: Secondary | ICD-10-CM | POA: Diagnosis not present

## 2023-02-16 DIAGNOSIS — M6281 Muscle weakness (generalized): Secondary | ICD-10-CM | POA: Diagnosis not present

## 2023-02-16 DIAGNOSIS — Z4889 Encounter for other specified surgical aftercare: Secondary | ICD-10-CM | POA: Diagnosis not present

## 2023-02-16 NOTE — Patient Instructions (Signed)
OK to begin weightbearing on left foot in surgical shoe. May return to regular shoe on right with orthotic insert (he has this at home)  OK for PT to do WB exercises, must be in shoe   Wound care: continue dressing changes daily or every other day. Apply Collagen dressings (such as puracol or prisma), then silicone foam border dressing such as mepilex).

## 2023-02-17 DIAGNOSIS — E119 Type 2 diabetes mellitus without complications: Secondary | ICD-10-CM | POA: Diagnosis not present

## 2023-02-17 DIAGNOSIS — I10 Essential (primary) hypertension: Secondary | ICD-10-CM | POA: Diagnosis not present

## 2023-02-17 DIAGNOSIS — E785 Hyperlipidemia, unspecified: Secondary | ICD-10-CM | POA: Diagnosis not present

## 2023-02-17 DIAGNOSIS — M86172 Other acute osteomyelitis, left ankle and foot: Secondary | ICD-10-CM | POA: Diagnosis not present

## 2023-02-17 DIAGNOSIS — K219 Gastro-esophageal reflux disease without esophagitis: Secondary | ICD-10-CM | POA: Diagnosis not present

## 2023-02-17 DIAGNOSIS — I5042 Chronic combined systolic (congestive) and diastolic (congestive) heart failure: Secondary | ICD-10-CM | POA: Diagnosis not present

## 2023-02-17 DIAGNOSIS — M109 Gout, unspecified: Secondary | ICD-10-CM | POA: Diagnosis not present

## 2023-02-17 NOTE — Progress Notes (Signed)
  Subjective:  Patient ID: Joshua [name], male    DOB: 06-27-1956,  MRN: [id number]  Chief Complaint  Patient presents with   Foot Ulcer    Left foot 4 week follow up      67 y.o. male returns for post-op check.  He is doing well, there is not been that much drainage.  They have been changing as directed  Review of Systems: Negative except as noted in the HPI. Denies N/V/F/Ch.   Objective:  There were no vitals filed for this visit. There is no height or weight on file to calculate BMI. Constitutional Well developed. Well nourished.  Vascular Foot warm and well perfused. Capillary refill normal to all digits.  Calf is soft and supple, no posterior calf or knee pain, negative Homans' sign  Neurologic Normal speech. Oriented to person, place, and time. Epicritic sensation to light touch grossly absent bilaterally.  Dermatologic Full-thickness wound remains measures 2.8 x 2.5 x 0.3 cm, no signs infection, fibrogranular wound bed.  Minimal biofilm.  Some hyperkeratosis surrounding.  Slight macerated.  Orthopedic: He has now tenderness to palpation noted about the surgical site.      Assessment:   1. Ulcer of left foot with fat layer exposed ([place])    Plan:  Patient was evaluated and treated and all questions answered.  S/p foot surgery left -Overall doing well should continue collagen dressing applications.  He may begin full weightbearing as tolerated in a surgical shoe which was dispensed today for the left foot.  He may return to his regular shoe gear on the right foot and utilize his offloading insert.  Okay to begin therapy for weightbearing exercise as well.  I will see him back in 4 weeks for reevaluation  Return in about 4 weeks (around 03/16/2023) for wound care.

## 2023-02-22 ENCOUNTER — Ambulatory Visit (INDEPENDENT_AMBULATORY_CARE_PROVIDER_SITE_OTHER): Payer: 59 | Admitting: Internal Medicine

## 2023-02-22 ENCOUNTER — Other Ambulatory Visit: Payer: Self-pay

## 2023-02-22 VITALS — BP 135/78 | HR 82 | Temp 98.0°F

## 2023-02-22 DIAGNOSIS — L089 Local infection of the skin and subcutaneous tissue, unspecified: Secondary | ICD-10-CM

## 2023-02-22 DIAGNOSIS — E11628 Type 2 diabetes mellitus with other skin complications: Secondary | ICD-10-CM
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
## 2023-02-22 NOTE — Patient Instructions (Signed)
Lab today  See me in 2 months  (Sooner if pus, pain, redness, fever, chill)

## 2023-02-23 DIAGNOSIS — E113493 Type 2 diabetes mellitus with severe nonproliferative diabetic retinopathy without macular edema, bilateral: Secondary | ICD-10-CM | POA: Diagnosis not present

## 2023-02-23 DIAGNOSIS — R5381 Other malaise: Secondary | ICD-10-CM | POA: Diagnosis not present

## 2023-02-23 DIAGNOSIS — K219 Gastro-esophageal reflux disease without esophagitis: Secondary | ICD-10-CM | POA: Diagnosis not present

## 2023-02-23 DIAGNOSIS — N184 Chronic kidney disease, stage 4 (severe): Secondary | ICD-10-CM | POA: Diagnosis not present

## 2023-02-23 DIAGNOSIS — E785 Hyperlipidemia, unspecified: Secondary | ICD-10-CM | POA: Diagnosis not present

## 2023-02-23 DIAGNOSIS — I5022 Chronic systolic (congestive) heart failure: Secondary | ICD-10-CM | POA: Diagnosis not present

## 2023-02-23 DIAGNOSIS — G629 Polyneuropathy, unspecified: Secondary | ICD-10-CM | POA: Diagnosis not present

## 2023-02-23 DIAGNOSIS — I2510 Atherosclerotic heart disease of native coronary artery without angina pectoris: Secondary | ICD-10-CM | POA: Diagnosis not present

## 2023-02-23 LAB — C-REACTIVE PROTEIN: CRP: 28.8 mg/L — ABNORMAL HIGH (ref <8.0)

## 2023-02-24 DIAGNOSIS — M6281 Muscle weakness (generalized): Secondary | ICD-10-CM | POA: Diagnosis not present

## 2023-02-24 DIAGNOSIS — M86372 Chronic multifocal osteomyelitis, left ankle and foot: Secondary | ICD-10-CM | POA: Diagnosis not present

## 2023-02-25 DIAGNOSIS — M86372 Chronic multifocal osteomyelitis, left ankle and foot: Secondary | ICD-10-CM | POA: Diagnosis not present

## 2023-02-25 DIAGNOSIS — M6281 Muscle weakness (generalized): Secondary | ICD-10-CM | POA: Diagnosis not present

## 2023-02-28 DIAGNOSIS — M6281 Muscle weakness (generalized): Secondary | ICD-10-CM | POA: Diagnosis not present

## 2023-02-28 DIAGNOSIS — R600 Localized edema: Secondary | ICD-10-CM | POA: Diagnosis not present

## 2023-02-28 DIAGNOSIS — M86372 Chronic multifocal osteomyelitis, left ankle and foot: Secondary | ICD-10-CM | POA: Diagnosis not present

## 2023-03-01 DIAGNOSIS — J449 Chronic obstructive pulmonary disease, unspecified: Secondary | ICD-10-CM | POA: Diagnosis not present

## 2023-03-01 DIAGNOSIS — M6281 Muscle weakness (generalized): Secondary | ICD-10-CM | POA: Diagnosis not present

## 2023-03-01 DIAGNOSIS — E1121 Type 2 diabetes mellitus with diabetic nephropathy: Secondary | ICD-10-CM | POA: Diagnosis not present

## 2023-03-01 DIAGNOSIS — M86372 Chronic multifocal osteomyelitis, left ankle and foot: Secondary | ICD-10-CM | POA: Diagnosis not present

## 2023-03-02 DIAGNOSIS — E11628 Type 2 diabetes mellitus with other skin complications: Secondary | ICD-10-CM | POA: Diagnosis not present

## 2023-03-02 DIAGNOSIS — R239 Unspecified skin changes: Secondary | ICD-10-CM | POA: Diagnosis not present

## 2023-03-02 DIAGNOSIS — Z4889 Encounter for other specified surgical aftercare: Secondary | ICD-10-CM | POA: Diagnosis not present

## 2023-03-02 DIAGNOSIS — M86372 Chronic multifocal osteomyelitis, left ankle and foot: Secondary | ICD-10-CM | POA: Diagnosis not present

## 2023-03-02 DIAGNOSIS — M6281 Muscle weakness (generalized): Secondary | ICD-10-CM | POA: Diagnosis not present

## 2023-03-03 DIAGNOSIS — M6281 Muscle weakness (generalized): Secondary | ICD-10-CM | POA: Diagnosis not present

## 2023-03-03 DIAGNOSIS — M86372 Chronic multifocal osteomyelitis, left ankle and foot: Secondary | ICD-10-CM | POA: Diagnosis not present

## 2023-03-04 DIAGNOSIS — M6281 Muscle weakness (generalized): Secondary | ICD-10-CM | POA: Diagnosis not present

## 2023-03-04 DIAGNOSIS — M86372 Chronic multifocal osteomyelitis, left ankle and foot: Secondary | ICD-10-CM | POA: Diagnosis not present

## 2023-03-08 DIAGNOSIS — M6281 Muscle weakness (generalized): Secondary | ICD-10-CM | POA: Diagnosis not present

## 2023-03-08 DIAGNOSIS — M86372 Chronic multifocal osteomyelitis, left ankle and foot: Secondary | ICD-10-CM | POA: Diagnosis not present

## 2023-03-09 DIAGNOSIS — R239 Unspecified skin changes: Secondary | ICD-10-CM | POA: Diagnosis not present

## 2023-03-09 DIAGNOSIS — Z4889 Encounter for other specified surgical aftercare: Secondary | ICD-10-CM | POA: Diagnosis not present

## 2023-03-09 DIAGNOSIS — E11628 Type 2 diabetes mellitus with other skin complications: Secondary | ICD-10-CM | POA: Diagnosis not present

## 2023-03-09 DIAGNOSIS — M6281 Muscle weakness (generalized): Secondary | ICD-10-CM | POA: Diagnosis not present

## 2023-03-10 DIAGNOSIS — M86372 Chronic multifocal osteomyelitis, left ankle and foot: Secondary | ICD-10-CM | POA: Diagnosis not present

## 2023-03-10 DIAGNOSIS — M6281 Muscle weakness (generalized): Secondary | ICD-10-CM | POA: Diagnosis not present

## 2023-03-11 DIAGNOSIS — M86372 Chronic multifocal osteomyelitis, left ankle and foot: Secondary | ICD-10-CM | POA: Diagnosis not present

## 2023-03-11 DIAGNOSIS — M6281 Muscle weakness (generalized): Secondary | ICD-10-CM | POA: Diagnosis not present

## 2023-03-14 DIAGNOSIS — M6281 Muscle weakness (generalized): Secondary | ICD-10-CM | POA: Diagnosis not present

## 2023-03-14 DIAGNOSIS — M86372 Chronic multifocal osteomyelitis, left ankle and foot: Secondary | ICD-10-CM | POA: Diagnosis not present

## 2023-03-15 DIAGNOSIS — M86372 Chronic multifocal osteomyelitis, left ankle and foot: Secondary | ICD-10-CM | POA: Diagnosis not present

## 2023-03-15 DIAGNOSIS — M6281 Muscle weakness (generalized): Secondary | ICD-10-CM | POA: Diagnosis not present

## 2023-03-16 DIAGNOSIS — M6281 Muscle weakness (generalized): Secondary | ICD-10-CM | POA: Diagnosis not present

## 2023-03-16 DIAGNOSIS — E11628 Type 2 diabetes mellitus with other skin complications: Secondary | ICD-10-CM | POA: Diagnosis not present

## 2023-03-16 DIAGNOSIS — R239 Unspecified skin changes: Secondary | ICD-10-CM | POA: Diagnosis not present

## 2023-03-16 DIAGNOSIS — M86372 Chronic multifocal osteomyelitis, left ankle and foot: Secondary | ICD-10-CM | POA: Diagnosis not present

## 2023-03-16 DIAGNOSIS — Z4889 Encounter for other specified surgical aftercare: Secondary | ICD-10-CM | POA: Diagnosis not present

## 2023-03-17 ENCOUNTER — Ambulatory Visit (INDEPENDENT_AMBULATORY_CARE_PROVIDER_SITE_OTHER): Payer: 59 | Admitting: Podiatry

## 2023-03-17 DIAGNOSIS — L97522 Non-pressure chronic ulcer of other part of left foot with fat layer exposed: Secondary | ICD-10-CM | POA: Diagnosis not present

## 2023-03-17 DIAGNOSIS — M86372 Chronic multifocal osteomyelitis, left ankle and foot: Secondary | ICD-10-CM | POA: Diagnosis not present

## 2023-03-17 DIAGNOSIS — M6281 Muscle weakness (generalized): Secondary | ICD-10-CM | POA: Diagnosis not present

## 2023-03-18 DIAGNOSIS — M6281 Muscle weakness (generalized): Secondary | ICD-10-CM | POA: Diagnosis not present

## 2023-03-18 DIAGNOSIS — M86372 Chronic multifocal osteomyelitis, left ankle and foot: Secondary | ICD-10-CM | POA: Diagnosis not present

## 2023-03-21 DIAGNOSIS — M6281 Muscle weakness (generalized): Secondary | ICD-10-CM | POA: Diagnosis not present

## 2023-03-21 DIAGNOSIS — M86372 Chronic multifocal osteomyelitis, left ankle and foot: Secondary | ICD-10-CM | POA: Diagnosis not present

## 2023-03-21 NOTE — Progress Notes (Signed)
  Subjective:  Patient ID: Joshua [name], male    DOB: 1956-07-03,  MRN: [id number]  Chief Complaint  Patient presents with   Foot Ulcer    Left foot 4 week follow up      67 y.o. male returns for post-op check.  He is doing well, there is not been that much drainage.  They have been changing as directed  Review of Systems: Negative except as noted in the HPI. Denies N/V/F/Ch.   Objective:  There were no vitals filed for this visit. There is no height or weight on file to calculate BMI. Constitutional Well developed. Well nourished.  Vascular Foot warm and well perfused. Capillary refill normal to all digits.  Calf is soft and supple, no posterior calf or knee pain, negative Homans' sign  Neurologic Normal speech. Oriented to person, place, and time. Epicritic sensation to light touch grossly absent bilaterally.  Dermatologic Full-thickness wound remains measures 2.2 x 1.0 x 0.3 cm, no signs infection, granular wound bed.  Minimal biofilm.  Some hyperkeratosis surrounding.  Excellent improvement  Orthopedic: He has no tenderness to palpation noted about the surgical site.      Assessment:   1. Ulcer of left foot with fat layer exposed (HCC)    Plan:  Patient was evaluated and treated and all questions answered.  S/p foot surgery left -Overall doing well should continue collagen dressing applications.  Continue WBAT in surgical shoe.  He may return to his regular shoe gear on the right foot and utilize his offloading insert.  Full-thickness excisional debridement to the subcutaneous layer of the biofilm and surrounding hyperkeratosis was completed today in an excisional manner with a sharp scalpel.  Postdebridement measurements are noted above.  There are no signs of infection.  Continues to improve significantly.  Foot position remains rectus  Return in about 4 weeks (around 03/16/2023) for wound care.

## 2023-03-22 DIAGNOSIS — M6281 Muscle weakness (generalized): Secondary | ICD-10-CM | POA: Diagnosis not present

## 2023-03-22 DIAGNOSIS — M86372 Chronic multifocal osteomyelitis, left ankle and foot: Secondary | ICD-10-CM | POA: Diagnosis not present

## 2023-03-23 DIAGNOSIS — M86372 Chronic multifocal osteomyelitis, left ankle and foot: Secondary | ICD-10-CM | POA: Diagnosis not present

## 2023-03-23 DIAGNOSIS — E11628 Type 2 diabetes mellitus with other skin complications: Secondary | ICD-10-CM | POA: Diagnosis not present

## 2023-03-23 DIAGNOSIS — M6281 Muscle weakness (generalized): Secondary | ICD-10-CM | POA: Diagnosis not present

## 2023-03-23 DIAGNOSIS — R239 Unspecified skin changes: Secondary | ICD-10-CM | POA: Diagnosis not present

## 2023-03-23 DIAGNOSIS — Z4889 Encounter for other specified surgical aftercare: Secondary | ICD-10-CM | POA: Diagnosis not present

## 2023-03-24 DIAGNOSIS — M86372 Chronic multifocal osteomyelitis, left ankle and foot: Secondary | ICD-10-CM | POA: Diagnosis not present

## 2023-03-24 DIAGNOSIS — M6281 Muscle weakness (generalized): Secondary | ICD-10-CM | POA: Diagnosis not present

## 2023-03-25 DIAGNOSIS — M86372 Chronic multifocal osteomyelitis, left ankle and foot: Secondary | ICD-10-CM | POA: Diagnosis not present

## 2023-03-25 DIAGNOSIS — F32A Depression, unspecified: Secondary | ICD-10-CM | POA: Diagnosis not present

## 2023-03-25 DIAGNOSIS — M6281 Muscle weakness (generalized): Secondary | ICD-10-CM | POA: Diagnosis not present

## 2023-03-29 DIAGNOSIS — L97522 Non-pressure chronic ulcer of other part of left foot with fat layer exposed: Secondary | ICD-10-CM | POA: Diagnosis not present

## 2023-03-30 DIAGNOSIS — M6281 Muscle weakness (generalized): Secondary | ICD-10-CM | POA: Diagnosis not present

## 2023-03-30 DIAGNOSIS — R239 Unspecified skin changes: Secondary | ICD-10-CM | POA: Diagnosis not present

## 2023-03-30 DIAGNOSIS — E11628 Type 2 diabetes mellitus with other skin complications: Secondary | ICD-10-CM | POA: Diagnosis not present

## 2023-03-30 DIAGNOSIS — Z4889 Encounter for other specified surgical aftercare: Secondary | ICD-10-CM | POA: Diagnosis not present

## 2023-03-31 DIAGNOSIS — J449 Chronic obstructive pulmonary disease, unspecified: Secondary | ICD-10-CM | POA: Diagnosis not present

## 2023-03-31 DIAGNOSIS — E1121 Type 2 diabetes mellitus with diabetic nephropathy: Secondary | ICD-10-CM | POA: Diagnosis not present

## 2023-04-04 DIAGNOSIS — I10 Essential (primary) hypertension: Secondary | ICD-10-CM | POA: Diagnosis not present

## 2023-04-04 DIAGNOSIS — K219 Gastro-esophageal reflux disease without esophagitis: Secondary | ICD-10-CM | POA: Diagnosis not present

## 2023-04-04 DIAGNOSIS — E785 Hyperlipidemia, unspecified: Secondary | ICD-10-CM | POA: Diagnosis not present

## 2023-04-04 DIAGNOSIS — G629 Polyneuropathy, unspecified: Secondary | ICD-10-CM | POA: Diagnosis not present

## 2023-04-04 DIAGNOSIS — E119 Type 2 diabetes mellitus without complications: Secondary | ICD-10-CM | POA: Diagnosis not present

## 2023-04-04 DIAGNOSIS — I5042 Chronic combined systolic (congestive) and diastolic (congestive) heart failure: Secondary | ICD-10-CM | POA: Diagnosis not present

## 2023-04-06 DIAGNOSIS — R239 Unspecified skin changes: Secondary | ICD-10-CM | POA: Diagnosis not present

## 2023-04-06 DIAGNOSIS — M6281 Muscle weakness (generalized): Secondary | ICD-10-CM | POA: Diagnosis not present

## 2023-04-06 DIAGNOSIS — E11628 Type 2 diabetes mellitus with other skin complications: Secondary | ICD-10-CM | POA: Diagnosis not present

## 2023-04-06 DIAGNOSIS — Z4889 Encounter for other specified surgical aftercare: Secondary | ICD-10-CM | POA: Diagnosis not present

## 2023-04-13 DIAGNOSIS — M6281 Muscle weakness (generalized): Secondary | ICD-10-CM | POA: Diagnosis not present

## 2023-04-13 DIAGNOSIS — R239 Unspecified skin changes: Secondary | ICD-10-CM | POA: Diagnosis not present

## 2023-04-13 DIAGNOSIS — E11628 Type 2 diabetes mellitus with other skin complications: Secondary | ICD-10-CM | POA: Diagnosis not present

## 2023-04-13 DIAGNOSIS — Z4889 Encounter for other specified surgical aftercare: Secondary | ICD-10-CM | POA: Diagnosis not present

## 2023-04-14 ENCOUNTER — Ambulatory Visit (INDEPENDENT_AMBULATORY_CARE_PROVIDER_SITE_OTHER): Payer: 59 | Admitting: Podiatry

## 2023-04-14 DIAGNOSIS — L97521 Non-pressure chronic ulcer of other part of left foot limited to breakdown of skin: Secondary | ICD-10-CM

## 2023-04-14 NOTE — Patient Instructions (Signed)
May resume regular bathing, apply neosporin after shower daily for 2 weeks, then use moisturizing lotion. Return 1 month for follow up

## 2023-04-14 NOTE — Progress Notes (Signed)
  Subjective:  Patient ID: Joshua [name], male    DOB: [birth year],  MRN: [id number]  Chief Complaint  Patient presents with   Foot Ulcer    4 week follow up left      67 y.o. male returns for follow-up on left foot wound  Review of Systems: Negative except as noted in the HPI. Denies N/V/F/Ch.   Objective:  There were no vitals filed for this visit. There is no height or weight on file to calculate BMI. Constitutional Well developed. Well nourished.  Vascular Foot warm and well perfused. Capillary refill normal to all digits.  Calf is soft and supple, no posterior calf or knee pain, negative Homans' sign  Neurologic Normal speech. Oriented to person, place, and time. Epicritic sensation to light touch grossly absent bilaterally.  Dermatologic Wound is fully healed there is overlying hyperkeratosis, no active drainage or ulceration  Orthopedic: He has no tenderness to palpation noted about the surgical site.      Assessment:   1. Ulcer of left foot, limited to breakdown of skin [facility])    Plan:  Patient was evaluated and treated and all questions answered.  S/p foot surgery left -O doing very well.  Wound is fully healed.  May return to regular shoe gear and activity gradually.  Resume regular bathing and lotion application.  Does not need further dressings or ointments.  Return in about 1 month (around 05/15/2023) for wound check .

## 2023-04-26 ENCOUNTER — Encounter: Payer: Self-pay | Admitting: Internal Medicine

## 2023-04-26 ENCOUNTER — Other Ambulatory Visit: Payer: Self-pay

## 2023-04-26 ENCOUNTER — Ambulatory Visit (INDEPENDENT_AMBULATORY_CARE_PROVIDER_SITE_OTHER): Payer: 59 | Admitting: Internal Medicine

## 2023-04-26 VITALS — BP 125/77 | HR 75 | Temp 97.3°F | Ht 69.0 in | Wt 255.0 lb

## 2023-04-26 DIAGNOSIS — L089 Local infection of the skin and subcutaneous tissue, unspecified: Secondary | ICD-10-CM | POA: Diagnosis not present

## 2023-04-26 DIAGNOSIS — E11628 Type 2 diabetes mellitus with other skin complications: Secondary | ICD-10-CM

## 2023-04-26 NOTE — Patient Instructions (Signed)
Your infection is cured at this time no need for id f/u
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
## 2023-04-27 LAB — C-REACTIVE PROTEIN: CRP: <3 mg/L (ref <8.0)

## 2023-04-28 ENCOUNTER — Other Ambulatory Visit: Payer: Self-pay | Admitting: Neurology

## 2023-04-28 ENCOUNTER — Ambulatory Visit (INDEPENDENT_AMBULATORY_CARE_PROVIDER_SITE_OTHER): Payer: 59 | Admitting: Internal Medicine

## 2023-04-28 ENCOUNTER — Encounter: Payer: Self-pay | Admitting: Internal Medicine

## 2023-04-28 VITALS — BP 140/88 | HR 72 | Ht 69.0 in | Wt 255.2 lb

## 2023-04-28 DIAGNOSIS — Z7984 Long term (current) use of oral hypoglycemic drugs: Secondary | ICD-10-CM

## 2023-04-28 DIAGNOSIS — E1159 Type 2 diabetes mellitus with other circulatory complications: Secondary | ICD-10-CM

## 2023-04-28 DIAGNOSIS — E1165 Type 2 diabetes mellitus with hyperglycemia: Secondary | ICD-10-CM

## 2023-04-28 DIAGNOSIS — E785 Hyperlipidemia, unspecified: Secondary | ICD-10-CM

## 2023-04-28 DIAGNOSIS — Z794 Long term (current) use of insulin: Secondary | ICD-10-CM

## 2023-04-28 DIAGNOSIS — Z7985 Long-term (current) use of injectable non-insulin antidiabetic drugs: Secondary | ICD-10-CM

## 2023-04-28 DIAGNOSIS — E119 Type 2 diabetes mellitus without complications: Secondary | ICD-10-CM

## 2023-04-28 LAB — POCT GLYCOSYLATED HEMOGLOBIN (HGB A1C): Hemoglobin A1C: 7.8 % — AB (ref 4.0–5.6)

## 2023-04-28 MED ORDER — LANTUS SOLOSTAR 100 UNIT/ML ~~LOC~~ SOPN: PEN_INJECTOR | SUBCUTANEOUS | 3 refills | Status: DC

## 2023-04-28 NOTE — Patient Instructions (Addendum)
Please continue: - Farxiga 10 mg before b'fast  Increase: - Lantus 20 units in am and 15 units at night  Try to start: - Ozempic 1 mg weekly  When you start Ozempic, stop Tradjenta.  Please return in 3-4 months.
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
## 2023-04-29 DIAGNOSIS — J449 Chronic obstructive pulmonary disease, unspecified: Secondary | ICD-10-CM | POA: Diagnosis not present

## 2023-04-29 DIAGNOSIS — E1121 Type 2 diabetes mellitus with diabetic nephropathy: Secondary | ICD-10-CM | POA: Diagnosis not present

## 2023-05-02 ENCOUNTER — Encounter: Payer: Self-pay | Admitting: Cardiovascular Disease

## 2023-05-02 ENCOUNTER — Ambulatory Visit: Payer: 59 | Attending: Cardiovascular Disease | Admitting: Cardiovascular Disease

## 2023-05-02 VITALS — BP 134/86 | HR 70 | Ht 69.0 in | Wt 255.4 lb

## 2023-05-02 DIAGNOSIS — E118 Type 2 diabetes mellitus with unspecified complications: Secondary | ICD-10-CM | POA: Diagnosis not present

## 2023-05-02 DIAGNOSIS — N1832 Chronic kidney disease, stage 3b: Secondary | ICD-10-CM

## 2023-05-02 DIAGNOSIS — Z794 Long term (current) use of insulin: Secondary | ICD-10-CM | POA: Diagnosis not present

## 2023-05-02 DIAGNOSIS — K219 Gastro-esophageal reflux disease without esophagitis: Secondary | ICD-10-CM | POA: Diagnosis not present

## 2023-05-02 DIAGNOSIS — Z9861 Coronary angioplasty status: Secondary | ICD-10-CM

## 2023-05-02 DIAGNOSIS — I6523 Occlusion and stenosis of bilateral carotid arteries: Secondary | ICD-10-CM | POA: Diagnosis not present

## 2023-05-02 DIAGNOSIS — I10 Essential (primary) hypertension: Secondary | ICD-10-CM | POA: Diagnosis not present

## 2023-05-02 DIAGNOSIS — G629 Polyneuropathy, unspecified: Secondary | ICD-10-CM | POA: Diagnosis not present

## 2023-05-02 DIAGNOSIS — E785 Hyperlipidemia, unspecified: Secondary | ICD-10-CM | POA: Diagnosis not present

## 2023-05-02 DIAGNOSIS — I2510 Atherosclerotic heart disease of native coronary artery without angina pectoris: Secondary | ICD-10-CM

## 2023-05-02 DIAGNOSIS — E669 Obesity, unspecified: Secondary | ICD-10-CM

## 2023-05-02 DIAGNOSIS — E119 Type 2 diabetes mellitus without complications: Secondary | ICD-10-CM | POA: Diagnosis not present

## 2023-05-02 DIAGNOSIS — G4733 Obstructive sleep apnea (adult) (pediatric): Secondary | ICD-10-CM

## 2023-05-02 DIAGNOSIS — I5042 Chronic combined systolic (congestive) and diastolic (congestive) heart failure: Secondary | ICD-10-CM | POA: Diagnosis not present

## 2023-05-02 MED ORDER — ISOSORBIDE MONONITRATE ER 60 MG PO TB24: ORAL_TABLET | ORAL | 3 refills | Status: DC

## 2023-05-02 NOTE — Progress Notes (Signed)
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 ophthalmic solution Place 1 drop into both eyes at bedtime.     melatonin 3 MG TABS tablet Take 3 mg by mouth at bedtime.     omeprazole (PRILOSEC) 20 MG capsule Take 20 mg by mouth daily.     oxyCODONE-acetaminophen (PERCOCET/ROXICET) 5-325 MG tablet Take 1 tablet by mouth every 6 (six) hours as needed.     pantoprazole (PROTONIX) 40 MG tablet TAKE 1 TABLET BY MOUTH TWICE DAILY BEFORE MEAL(S) 180 tablet 0   polyethylene glycol powder (GLYCOLAX/MIRALAX) 17 GM/SCOOP powder Take 1 capful  (17 g) by mouth daily as needed for moderate constipation. 238 g 0   Semaglutide, 1 MG/DOSE, (OZEMPIC, 1 MG/DOSE,) 4 MG/3ML SOPN Inject 1 mg into the skin once a week. 9 mL 3   senna-docusate (SENOKOT-S) 8.6-50 MG tablet Take 1 tablet by mouth 2 (two) times daily. 60 tablet 0   tamsulosin (FLOMAX) 0.4 MG CAPS capsule Take 0.4 mg by mouth daily after supper. Prn for urinary tract symptoms     ticagrelor (BRILINTA) 90 MG TABS tablet Take 1 tablet by mouth twice daily (Patient taking differently: Take 90 mg by mouth 2 (two) times daily.) 180 tablet 3   VITAMIN D, CHOLECALCIFEROL, PO Take by mouth.     isosorbide mononitrate (IMDUR) 60 MG 24 hr tablet Take 3 tablets by mouth every day 90 tablet 3   furosemide (LASIX) 40 MG tablet Take 1 tablet (40 mg total) by mouth 2 (two) times daily. 30 tablet    metoprolol succinate  (TOPROL-XL) 25 MG 24 hr tablet Take 3 tablets (75 mg total) by mouth daily. 90 tablet 0   nitroGLYCERIN (NITROSTAT) 0.4 MG SL tablet Place 1 tablet (0.4 mg total) under the tongue every 5 (five) minutes as needed for chest pain. (Patient not taking: Reported on 05/02/2023) 25 tablet 3   No facility-administered medications prior to visit.     Allergies:   Chlorthalidone   Social History   Socioeconomic History   Marital status: Married    Spouse name: Alvino Chapel   Number of children: 3   Years of education: Not on file   Highest education level: Not on file  Occupational History    Comment: disabled  Tobacco Use   Smoking status: Never   Smokeless tobacco: Never  Vaping Use   Vaping Use: Never used  Substance and Sexual Activity   Alcohol use: Yes  Comment: beer occassionally   Drug use: No   Sexual activity: Yes    Partners: Female  Other Topics Concern   Not on file  Social History Narrative   Not on file   Social Determinants of Health   Financial Resource Strain: Not on file  Food Insecurity: Food Insecurity Present (11/30/2022)   Hunger Vital Sign    Worried About Running Out of Food in the Last Year: Often true    Ran Out of Food in the Last Year: Often true  Transportation Needs: Unmet Transportation Needs (11/26/2022)   PRAPARE - Administrator, Civil Service (Medical): Yes    Lack of Transportation (Non-Medical): Yes  Physical Activity: Not on file  Stress: Not on file  Social Connections: Not on file    Previously had worked at [employer]  Family History:  The patient's family history includes Diabetes in his maternal grandmother; Heart attack in his maternal aunt; Hypertension in his maternal grandmother; Sudden death in his mother and another family member.   ROS General: Negative; No fevers, chills, or night sweats;  HEENT: Negative; No changes in vision or hearing, sinus congestion, difficulty swallowing Pulmonary: Negative;  No cough, wheezing, shortness of breath, hemoptysis Cardiovascular: Negative; No chest pain, presyncope, syncope, palpitations GI: Negative; No nausea, vomiting, diarrhea, or abdominal pain GU: Negative; No dysuria, hematuria, or difficulty voiding Musculoskeletal: Negative; no myalgias, joint pain, or weakness Hematologic/Oncology: Negative; no easy bruising, bleeding Endocrine: Negative; no heat/cold intolerance; no diabetes Neuro: Negative; no changes in balance, headaches Skin: Negative; No rashes or skin lesions Psychiatric: Negative; No behavioral problems, depression Sleep: Positive for OSA on CPAP therapy;  no bruxism, restless legs, hypnogognic hallucinations, no cataplexy Other comprehensive 14 point system review is negative.   PHYSICAL EXAM:   VS:  BP 134/86   Pulse 70   Ht 5\' 9"  (1.753 m)   Wt 255 lb 6.4 oz (115.8 kg)   SpO2 100%   BMI 37.72 kg/m     Repeat blood pressure by me was 144/86 with his pulse in the 90s  Wt Readings from Last 3 Encounters:  05/02/23 255 lb 6.4 oz (115.8 kg)  04/28/23 255 lb 3.2 oz (115.8 kg)  04/26/23 255 lb (115.7 kg)    General: Alert, oriented, no distress.  Morbidly obese with BMI at 46.1 Skin: normal turgor, no rashes, warm and dry HEENT: Normocephalic, atraumatic. Pupils equal round and reactive to light; sclera anicteric; extraocular muscles intact;  Nose without nasal septal hypertrophy Mouth/Parynx benign; Mallinpatti scale 4 Neck: No JVD, no carotid bruits; normal carotid upstroke Lungs: clear to ausculatation and percussion; no wheezing or rales Chest wall: without tenderness to palpitation Heart: PMI not displaced, RRR, s1 s2 normal, 1/6 systolic murmur, no diastolic murmur, no rubs, gallops, thrills, or heaves Abdomen: soft, nontender; no hepatosplenomehaly, BS+; abdominal aorta nontender and not dilated by palpation. Back: no CVA tenderness Pulses 2+ Musculoskeletal: full range of motion, normal strength, no joint  deformities Extremities: no clubbing cyanosis or edema, Homan's sign negative  Neurologic: grossly nonfocal; Cranial nerves grossly wnl Psychologic: Normal mood and affect   Studies/Labs Reviewed:   May 02, 2023 ECG (independently read by me): NSR at 70; 1st degree AV block; PR 214 msec  February 23, 2021 ECG (independently read by me): NSR at 90; PR interval 202 msec; QS V1-2  February 27, 2020 ECG (independently read by me): Normal sinus rhythm at 70 bpm, first-degree AV block with a PR interval  216 ms.  Possible left atrial enlargement.  No ST segment changes.  December 2020 ECG (independently read by me): Normal sinus rhythm at 85 bpm.  Borderline first-degree AV block with  PR interval 202 ms.  Incomplete left bundle branch block.  No ectopy.  Recent Labs:    Latest Ref Rng & Units 12/09/2022    5:00 AM 12/05/2022    6:53 PM 12/04/2022    8:25 AM  BMP  Glucose 70 - 99 mg/dL 161  096  96   BUN 8 - 23 mg/dL 38  25  22   Creatinine 0.61 - 1.24 mg/dL 0.45  4.09  8.11   Sodium 135 - 145 mmol/L 137  138  136   Potassium 3.5 - 5.1 mmol/L 3.4  4.0  3.8   Chloride 98 - 111 mmol/L 108  106  108   CO2 22 - 32 mmol/L 24  25  24    Calcium 8.9 - 10.3 mg/dL 8.4  8.9  8.7         Latest Ref Rng & Units 11/26/2022    4:11 AM 11/18/2022    4:05 PM 08/11/2021    9:29 AM  Hepatic Function  Total Protein 6.5 - 8.1 g/dL 7.6  8.1  7.2   Albumin 3.5 - 5.0 g/dL 2.4   3.9   AST 15 - 41 U/L 21  20  16    ALT 0 - 44 U/L 16  17  12    Alk Phosphatase 38 - 126 U/L 130   183   Total Bilirubin 0.3 - 1.2 mg/dL 0.5  0.5  <9.1        Latest Ref Rng & Units 12/09/2022   11:02 AM 12/05/2022    6:53 PM 12/04/2022    8:25 AM  CBC  WBC 4.0 - 10.5 K/uL 6.5   5.6   Hemoglobin 13.0 - 17.0 g/dL 9.7  47.8  29.5   Hematocrit 39.0 - 52.0 % 30.3  33.7  33.6   Platelets 150 - 400 K/uL 131   187    Lab Results  Component Value Date   MCV 88.1 12/09/2022   MCV 89.1 12/04/2022   MCV 88.3 12/01/2022   Lab  Results  Component Value Date   TSH 2.890 08/11/2021   Lab Results  Component Value Date   HGBA1C 7.8 (A) 04/28/2023     BNP    Component Value Date/Time   BNP 81.1 08/16/2019 2133    ProBNP No results found for: "PROBNP"   Lipid Panel     Component Value Date/Time   CHOL 106 11/30/2022 0209   CHOL 283 (H) 08/11/2021 0929   TRIG 151 (H) 11/30/2022 0209   HDL 43 11/30/2022 0209   HDL 89 08/11/2021 0929   CHOLHDL 2.5 11/30/2022 0209   VLDL 30 11/30/2022 0209   LDLCALC 33 11/30/2022 0209   LDLCALC 169 (H) 08/11/2021 0929   LABVLDL 25 08/11/2021 0929     RADIOLOGY: No results found.   Additional studies/ records that were reviewed today include:    CARDIAC CATH 04/09/2019 Previously placed Mid LAD drug eluting stent is widely patent. Ost 1st Diag to 1st Diag lesion is 40% stenosed. Dist LAD lesion is 10% stenosed. Previously placed 2nd Mrg drug eluting stent is widely patent. 3rd Mrg lesion is 30% stenosed. Mid Cx lesion is 50% stenosed. RPDA lesion is 30% stenosed. Prox RCA to Mid RCA lesion is 40% stenosed. Mid RCA lesion is 25% stenosed. LV end  diastolic pressure is normal. There is no aortic valve stenosis. Ost LAD to Prox LAD lesion is 90% stenosed. In stent restenosis. Scoring balloon angioplasty was performed using a BALLOON WOLVERINE 3.75X10, followed by 4.0 Waterflow balloon. Prox LAD lesion is 90% stenosed. Post intervention, there is a 0% residual stenosis.   Continue IV fluids post procedure.  He will need aggressive secondary prevention.     I stressed the importance of taking Brilinta 2x/day.  He admits to occasionally missing the second dose due to cost.  He states Plavix was stopped in the past, but he is not sure of the specifics.                  ECHO: 11/29/2022  1. Left ventricular ejection fraction, by estimation, is 55 to 60%. The  left ventricle has normal function. The left ventricle has no regional  wall motion abnormalities.  Left ventricular diastolic parameters were  normal.   2. Right ventricular systolic function is normal. The right ventricular  size is normal.   3. The mitral valve is normal in structure. No evidence of mitral valve  regurgitation. No evidence of mitral stenosis.   4. The aortic valve is normal in structure. Aortic valve regurgitation is  not visualized. No aortic stenosis is present.   5. The inferior vena cava is dilated in size with >50% respiratory  variability, suggesting right atrial pressure of 8 mmHg.    CAROTID DUPLEX: 01/18/2023 Summary:  Right Carotid: Velocities in the right ICA are consistent with a 1-39%  stenosis.   Left Carotid: Velocities in the left ICA are consistent with a 1-39%  stenosis.   Vertebrals: Left vertebral artery demonstrates antegrade flow. Right  vertebral              artery demonstrates no discernable flow.  Subclavians: Normal flow hemodynamics were seen in bilateral subclavian               arteries.    ASSESSMENT:    1. Essential hypertension   2. CAD in native artery   3. CAD S/P multiple PCIs   4. OSA (obstructive sleep apnea)   5. Type 2 diabetes mellitus with complication, with long-term current use of insulin (HCC)   6. Stage 3b chronic kidney disease (CKD) (HCC)   7. Obesity, Class II, BMI 35-39.9   8. Bilateral carotid artery stenosis: mild; 01/18/2023     PLAN:  1.  CAD: Status post multiple PCI's with initial symptomatology in the setting of acute coronary syndrome November 2012.  In September 2020 he underwent his last intervention.  At present, he denies any recurrent anginal symptomatology.  Since I last saw him it does not appear that he is on amlodipine anymore.  He is now on isosorbide 180 mg daily, metoprolol succinate 75 mg daily.  He is supposed to be aspirin and Brilinta but it does not appear that he has received any Brilinta over the past 2 weeks.  Previously I had recommended lifelong DAPT and will reinstitute  Brilinta.  I also have recommended that he change his isosorbide from 180 mg in the morning and take 120 mg in the morning and 60 mg in the evening.  Most recent echo Doppler study from December 09, 2022 showed EF at 55 to 60% without regional wall motion abnormalities.  2.  Essential hypertension: Blood pressure when taken by me was 126/76 although upon presentation was 134/86.  Resting pulse is 70 on his current regimen of  metoprolol succinate 75 mg daily.  3.  Obstructive sleep apnea: When I last saw Mr. McNeil in March 2022 he had stopped using CPAP due to complaints with his mask not fitting properly.  He subsequently received a new mask with significant improvement from his prior leak.  I attempted to do a download today, it appears that his last usage with CPAP was on July 2024, 2023 for 59 minutes.  His CPAP is set at a pressure range of 10 to 20 cm of water.  I again reiterated the importance of resumption of CPAP therapy particularly with his significant cardiovascular comorbidities  4.  Hyperlipidemia: Remotely, I did he had been on Zetia and rosuvastatin.  It appears now that he is on atorvastatin 80 mg daily and is not on Zetia.  5. Chronic kidney disease: Patient is followed by Dr. Bufford Buttner.  Previously creatinine on December 20, 2019 was 2.6 with a BUN of 36.  Creatinine on December 09, 2022 was 2.04.  Most recent creatinine on January 20, 2023 was 1.45.  6.  Diabetes mellitus with complication: History of poorly controlled diabetes with prior history of DKA.  Currently on insulin.  He is followed by Dr. Milas Hock for endocrinology.  He is on Farxiga 10 mg, Humalog insulin, Lantus insulin and Ozempic.  7.  Right transmetatarsal amputation: This has resulted in some imbalance to his gait with walking.  He had received a prosthesis for shoe.  8.  Morbid obesity: Previous BMI was 46.07.  He has lost significant weight and most recent BMI is 37.72.  9.  Mild bilateral carotid  stenosis 1 to 39%  He has follow-up office visits with [facility], [facility] neurology, and with Dr. Elvera Lennox.  I have recommended that he see Edd Fabian, NP in 6 months and I will see him in March 2025 for follow-up evaluation.   Time spent: 40 minutes  Medication Adjustments/Labs and Tests Ordered: Current medicines are reviewed at length with the patient today.  Concerns regarding medicines are outlined above.  Medication changes, Labs and Tests ordered today are listed in the Patient Instructions below. Patient Instructions  Medication Instructions:  Your physician has recommended you make the following change in your medication:  CHANGE: Imdur 120 mg (two tablets) in the morning, take 60 mg (one tablet) in the evening.  RESUME: Ticagrelor (Brilinta) 90 mg twice daily  *If you need a refill on your cardiac medications before your next appointment, please call your pharmacy*   Lab Work: None   Testing/Procedures: None   Follow-Up: At [facility], you and your health needs are our priority.  As part of our continuing mission to provide you with exceptional heart care, we have created designated Provider Care Teams.  These Care Teams include your primary Cardiologist (physician) and Advanced Practice Providers (APPs -  Physician Assistants and Nurse Practitioners) who all work together to provide you with the care you need, when you need it.  We recommend signing up for the patient portal called "MyChart".  Sign up information is provided on this After Visit Summary.  MyChart is used to connect with patients for Virtual Visits (Telemedicine).  Patients are able to view lab/test results, encounter notes, upcoming appointments, etc.  Non-urgent messages can be sent to your provider as well.   To learn more about what you can do with MyChart, go to [email or web address].    Your next appointment:   4-5 month(s)  Provider:   Verdon Cummins  Molli Hazard, NP    Signed, Nicki Guadalajara, MD  05/09/2023 10:22 AM    [facility] [address], [place], [place]  [address] Phone: [phone]

## 2023-05-02 NOTE — Patient Instructions (Addendum)
Medication Instructions:  Your physician has recommended you make the following change in your medication:  CHANGE: Imdur 120 mg (two tablets) in the morning, take 60 mg (one tablet) in the evening.  RESUME: Ticagrelor (Brilinta) 90 mg twice daily  *If you need a refill on your cardiac medications before your next appointment, please call your pharmacy*   Lab Work: None   Testing/Procedures: None   Follow-Up: At [facility], you and your health needs are our priority.  As part of our continuing mission to provide you with exceptional heart care, we have created designated Provider Care Teams.  These Care Teams include your primary Cardiologist (physician) and Advanced Practice Providers (APPs -  Physician Assistants and Nurse Practitioners) who all work together to provide you with the care you need, when you need it.  We recommend signing up for the patient portal called "MyChart".  Sign up information is provided on this After Visit Summary.  MyChart is used to connect with patients for Virtual Visits (Telemedicine).  Patients are able to view lab/test results, encounter notes, upcoming appointments, etc.  Non-urgent messages can be sent to your provider as well.   To learn more about what you can do with MyChart, go to [email or web address].    Your next appointment:   4-5 month(s)  Provider:   Edd Fabian, NP

## 2023-05-03 ENCOUNTER — Ambulatory Visit (INDEPENDENT_AMBULATORY_CARE_PROVIDER_SITE_OTHER): Payer: 59 | Admitting: Podiatry

## 2023-05-03 ENCOUNTER — Telehealth: Payer: Self-pay | Admitting: Cardiovascular Disease

## 2023-05-03 DIAGNOSIS — L97521 Non-pressure chronic ulcer of other part of left foot limited to breakdown of skin: Secondary | ICD-10-CM | POA: Diagnosis not present

## 2023-05-03 DIAGNOSIS — M2142 Flat foot [pes planus] (acquired), left foot: Secondary | ICD-10-CM | POA: Diagnosis not present

## 2023-05-03 DIAGNOSIS — Z89431 Acquired absence of right foot: Secondary | ICD-10-CM

## 2023-05-03 DIAGNOSIS — M21962 Unspecified acquired deformity of left lower leg: Secondary | ICD-10-CM

## 2023-05-03 DIAGNOSIS — M2141 Flat foot [pes planus] (acquired), right foot: Secondary | ICD-10-CM | POA: Diagnosis not present

## 2023-05-03 DIAGNOSIS — Z794 Long term (current) use of insulin: Secondary | ICD-10-CM

## 2023-05-03 DIAGNOSIS — M21961 Unspecified acquired deformity of right lower leg: Secondary | ICD-10-CM

## 2023-05-03 DIAGNOSIS — E119 Type 2 diabetes mellitus without complications: Secondary | ICD-10-CM

## 2023-05-03 NOTE — Progress Notes (Signed)
  Subjective:  Patient ID: Joshua [name], male    DOB: 1956-08-17,  MRN: [id number]  Chief Complaint  Patient presents with   Foot Ulcer    3 week follow up left foot ulcer      67 y.o. male returns for follow-up on left foot wound.  He is doing well the foot remains healed.  Review of Systems: Negative except as noted in the HPI. Denies N/V/F/Ch.   Objective:  There were no vitals filed for this visit. There is no height or weight on file to calculate BMI. Constitutional Well developed. Well nourished.  Vascular Foot warm and well perfused. Capillary refill normal to all digits.  Calf is soft and supple, no posterior calf or knee pain, negative Homans' sign  Neurologic Normal speech. Oriented to person, place, and time. Epicritic sensation to light touch grossly absent bilaterally.  Dermatologic Wound is fully healed there is overlying hyperkeratosis, no active drainage or ulceration.  Thickened elongated yellow discolored dystrophic nails x 5 on the left foot.  Well-healed TMA incision on right foot  Orthopedic: He has no tenderness to palpation noted about the surgical site.      Assessment:   1. Ulcer of left foot, limited to breakdown of skin (HCC)   2. Insulin dependent type 2 diabetes mellitus (HCC)   3. Pes planus of both feet   4. Status post transmetatarsal amputation of foot, right (HCC)   5. Acquired deformity of both feet    Plan:  Patient was evaluated and treated and all questions answered.  Discussed the etiology and treatment options for the condition in detail with the patient. Recommended debridement of the nails today. Sharp and mechanical debridement performed of all painful and mycotic nails today. Nails debrided in length and thickness using a nail nipper to level of comfort. Discussed treatment options including appropriate shoe gear. Follow up as needed for painful nails.  Regarding his ulceration and foot condition he has healed remarkably well.   His foot position remains rectus.  I recommend we begin to transition him back to regular shoes and he is in need of custom molded multidensity insoles with an unload under the ulcerated site on the left foot and a amputation filler on the right foot for his transmetatarsal amputation.  He will be fitted for these.  I will see him back in 3 months for follow-up for at risk diabetic footcare advised on signs symptoms of worsening infection or recurrence of ulceration and he should see me sooner if any of these develop.   Return in about 3 months (around 08/03/2023) for at risk diabetic foot care.

## 2023-05-03 NOTE — Telephone Encounter (Signed)
Pt c/o medication issue:  1. Name of Medication:  Imdur 60 MG  2. How are you currently taking this medication (dosage and times per day)?   3. Are you having a reaction (difficulty breathing--STAT)?   4. What is your medication issue?   Tammy with [facility] would like to clarify medication changes.

## 2023-05-03 NOTE — Telephone Encounter (Signed)
Joshua [name] states she was reviewing patient AVS from his OV yesterday and saw patient's IMDUR 60mg  24 hr was increased to 120mg  in the morning (2tablets) and 60mg  in the evening (1 tablet). She states she wanted to clarify med change with provider.

## 2023-05-04 NOTE — Telephone Encounter (Signed)
Spoke with tammy, she is asking if the extended release isosorbide is to be taken twice daily. The patient was previously on the extended release 180 mg once daily. Yes, the extended release is to be taken twice daily.

## 2023-05-04 NOTE — Telephone Encounter (Signed)
Tammy is following up. Looks like Dr. Tresa Endo will not return to the office for several weeks and patient will be discharged soon. Is anyone else able to advise on this?

## 2023-05-05 DIAGNOSIS — E785 Hyperlipidemia, unspecified: Secondary | ICD-10-CM | POA: Diagnosis not present

## 2023-05-05 DIAGNOSIS — E119 Type 2 diabetes mellitus without complications: Secondary | ICD-10-CM | POA: Diagnosis not present

## 2023-05-05 DIAGNOSIS — I5042 Chronic combined systolic (congestive) and diastolic (congestive) heart failure: Secondary | ICD-10-CM | POA: Diagnosis not present

## 2023-05-05 DIAGNOSIS — G629 Polyneuropathy, unspecified: Secondary | ICD-10-CM | POA: Diagnosis not present

## 2023-05-05 DIAGNOSIS — I10 Essential (primary) hypertension: Secondary | ICD-10-CM | POA: Diagnosis not present

## 2023-05-05 DIAGNOSIS — K219 Gastro-esophageal reflux disease without esophagitis: Secondary | ICD-10-CM | POA: Diagnosis not present

## 2023-05-06 DIAGNOSIS — F32A Depression, unspecified: Secondary | ICD-10-CM | POA: Diagnosis not present

## 2023-05-09 ENCOUNTER — Encounter: Payer: Self-pay | Admitting: Cardiovascular Disease

## 2023-05-18 DIAGNOSIS — E113493 Type 2 diabetes mellitus with severe nonproliferative diabetic retinopathy without macular edema, bilateral: Secondary | ICD-10-CM | POA: Diagnosis not present

## 2023-05-18 DIAGNOSIS — H40053 Ocular hypertension, bilateral: Secondary | ICD-10-CM | POA: Diagnosis not present

## 2023-05-18 LAB — HM DIABETES EYE EXAM

## 2023-05-19 ENCOUNTER — Ambulatory Visit: Payer: 59 | Admitting: Podiatry

## 2023-05-19 ENCOUNTER — Telehealth: Payer: Self-pay | Admitting: Podiatry

## 2023-05-19 ENCOUNTER — Encounter: Payer: Self-pay | Admitting: Internal Medicine

## 2023-05-19 DIAGNOSIS — M21962 Unspecified acquired deformity of left lower leg: Secondary | ICD-10-CM

## 2023-05-19 DIAGNOSIS — M2142 Flat foot [pes planus] (acquired), left foot: Secondary | ICD-10-CM

## 2023-05-19 DIAGNOSIS — E119 Type 2 diabetes mellitus without complications: Secondary | ICD-10-CM

## 2023-05-19 NOTE — Progress Notes (Signed)
Patient presents today to be measured diabetic shoes and insoles.  Patient was measured for 1 pair of diabetic shoes and 3 pairs of foam casted diabetic insoles.   Wt  255 Shoe size 13.5-14  Shoe type [id number] [id number] x952m Pt need toe filler for Rt foot   Treating physician   christina gerghe   Re-appointment for regularly scheduled diabetic foot care visits or if they should experience any trouble with the shoes or insoles.

## 2023-05-19 NOTE — Telephone Encounter (Signed)
Patient called near 5pm today, noting he "might" have a piece of glass in his wound on left foot.  States a week ago, he put some beers in the freezer to get cold and one fell out when he opened the door and shattered on the floor.  He didn't think he stepped on any glass, but since then, it has gotten a sharp pain in the wound.    He states it doesn't look infected.  He also noted he cannot see the wound.  Advised patient he'd need to come in and be seen and x-rayed to r/o foreign body in wound/foot, but that the office was now closed.  He will wait until tomorrow for a call back to be scheduled.  He was advised to try and avoid walking on the painful area until he is seen.    His call back number is [phone].  Cc'd Dawn in scheduling as well.

## 2023-05-20 ENCOUNTER — Ambulatory Visit: Payer: 59 | Admitting: Podiatry

## 2023-05-20 NOTE — Telephone Encounter (Signed)
Pt called and is not able to make the appt today due to transportation issues. He stated he need at least a 2 day notice for the [facility] to provide transportation. I have r/s pt for 6.12 at 345pm with Dr Ralene Cork.

## 2023-05-25 ENCOUNTER — Ambulatory Visit (INDEPENDENT_AMBULATORY_CARE_PROVIDER_SITE_OTHER): Payer: 59 | Admitting: Podiatry

## 2023-05-25 ENCOUNTER — Ambulatory Visit (INDEPENDENT_AMBULATORY_CARE_PROVIDER_SITE_OTHER): Payer: 59

## 2023-05-25 ENCOUNTER — Encounter: Payer: Self-pay | Admitting: Podiatry

## 2023-05-25 DIAGNOSIS — M869 Osteomyelitis, unspecified: Secondary | ICD-10-CM | POA: Diagnosis not present

## 2023-05-25 DIAGNOSIS — S99922A Unspecified injury of left foot, initial encounter: Secondary | ICD-10-CM

## 2023-05-25 DIAGNOSIS — I5032 Chronic diastolic (congestive) heart failure: Secondary | ICD-10-CM | POA: Diagnosis not present

## 2023-05-25 DIAGNOSIS — L97522 Non-pressure chronic ulcer of other part of left foot with fat layer exposed: Secondary | ICD-10-CM

## 2023-05-25 DIAGNOSIS — S90852A Superficial foreign body, left foot, initial encounter: Secondary | ICD-10-CM | POA: Diagnosis not present

## 2023-05-25 DIAGNOSIS — N184 Chronic kidney disease, stage 4 (severe): Secondary | ICD-10-CM | POA: Diagnosis not present

## 2023-05-25 DIAGNOSIS — I129 Hypertensive chronic kidney disease with stage 1 through stage 4 chronic kidney disease, or unspecified chronic kidney disease: Secondary | ICD-10-CM | POA: Diagnosis not present

## 2023-05-25 NOTE — Progress Notes (Signed)
Subjective:  Patient ID: Joshua [name], male    DOB: May 03, 1956,   MRN: [id number]  Chief Complaint  Patient presents with   Foot Injury    Patient stepped on some glass , states he cleaned his foot really good but not sure if he got all the glass out     67 y.o. male presents for concern of foreign body in his left foot. Relates about a week ago he shattered a beer bottle. At the time he did not think he had stepped on anything but a few days later he noticed some pain in the bottom of the foot He was not able to get down to his foot or see anything so decided to come in a be seen. He has a history of ulceration to the left foot and TMA on right and has healed well in the care of Dr. Lilian Kapur . Denies any other pedal complaints. Denies n/v/f/c.   Past Medical History:  Diagnosis Date   Acute coronary syndrome (HCC)    Anemia    B12 deficiency    Chest pain    CHF (congestive heart failure) (HCC)    Chronic combined systolic and diastolic CHF (congestive heart failure) (HCC)    a. 07/2015 Echo: EF 45-50%, mod LVH, mid apical/antsept AK, Gr 1 DD.   Chronic low back pain    CKD (chronic kidney disease), stage III (HCC)    Constipation    COPD (chronic obstructive pulmonary disease) (HCC)    Coronary artery disease    a. 2012 s/p MI/PCI: s/p DES to mid/dist RCA and overlapping DES to LAD;  b. 07/2015 Cath: LAD 10% ISR, D1 40(jailed), LCX 70m, OM2 30, OM3 30, RCA 75m ISR, 10d ISR; c. 07/2016 NSTEMI/PCI: LAD 85ost/p/m (3.5x20 Synergy DES overlapping prior stent), D1 40, LCX 54m, OM2 95 (2.75x16 Synergy DES), OM3 30, RCA 51m, 10d.   Depression    Diabetic gastroparesis (HCC)    Diabetic polyneuropathy (HCC)    DKA (diabetic ketoacidoses) 03/14/2018   GERD (gastroesophageal reflux disease)    Gout    Heart failure (HCC)    Heart murmur    History of MI (myocardial infarction)    Hyperlipemia    Hypertension    Hypertensive heart disease    Hypokalemia    Ischemic cardiomyopathy     a. 07/2015 Echo: EF 45-50%, mod LVH, mid-apicalanteroseptal AK, Gr1 DD.   Joint pain    Kidney problem    Morbid obesity (HCC)    OSA on CPAP    Osteoarthritis    Osteomyelitis (HCC)    a. 07/2016 MTP joint of R great toe s/p transmetatarsal amputation.   Other fatigue    Polyneuropathy in diabetes(357.2)    Pulmonary sarcoidosis (HCC)    "problems w/it years ago; no problems anymore" (07/03/2014)   Rectal bleeding    Shortness of breath    Shortness of breath on exertion    Sleep apnea    Stomach ulcer    Swallowing difficulty    Type II diabetes mellitus (HCC)     Objective:  Physical Exam: Vascular: DP/PT pulses 2/4 bilateral. CFT <3 seconds. Normal hair growth on digits. No edema.  Skin. No lacerations or abrasions bilateral feet. Plantar lateral left foot with small hyperkeratotic area upon debridement small  opening with presence of small piece of glass noted and debrided. Blood blister noted wrapping around to lateral foot with no wound noted in this area and remains healed.  Musculoskeletal: MMT 5/5  bilateral lower extremities in DF, PF, Inversion and Eversion. Deceased ROM in DF of ankle joint. TMA on right. Left foot with hyperkeratosis to lateral foot.  Neurological: Sensation intact to light touch.   Assessment:   1. Ulcer of left foot with fat layer exposed (HCC)   2. Foreign body in left foot, initial encounter   3. Injury of left foot, initial encounter [Z61.096E]      Plan:  Patient was evaluated and treated and all questions answered. Foreign body left foot with ulceration and fat layer epxosed  -Debridement as below. -Dressed with betadine, DSD. -Off-loading with surgical shoe. -No abx indicated.  -Discussed glucose control and proper protein-rich diet.  -Discussed if any worsening redness, pain, fever or chills to call or may need to report to the emergency room. Patient expressed understanding.   Procedure: Excisional Debridement of  foregin body   Rationale: Removal of non-viable soft tissue from the wound to promote healing Removal of small 0.3 cm piece of gold glass .  Anesthesia: none Pre-Debridement Wound Measurements: Overlying hyperkertosis  Post-Debridement Wound Measurements: 0.1 cm x 0.1 cm x 0.2 cm  Type of Debridement: Sharp Excisional of foreign body Tissue Removed: Non-viable soft tissue and glass Depth of Debridement: subcutaneous tissue. Technique: Sharp excisional debridement to bleeding, viable wound base.  Dressing: Dry, sterile, compression dressing. Disposition: Patient tolerated procedure well. Patient to return in 2 week for follow-up.  Return in about 2 weeks (around 06/08/2023) for wound check.   Louann Sjogren, DPM

## 2023-06-13 ENCOUNTER — Ambulatory Visit (INDEPENDENT_AMBULATORY_CARE_PROVIDER_SITE_OTHER): Payer: 59 | Admitting: Podiatry

## 2023-06-13 ENCOUNTER — Encounter: Payer: Self-pay | Admitting: Podiatry

## 2023-06-13 DIAGNOSIS — Z794 Long term (current) use of insulin: Secondary | ICD-10-CM | POA: Diagnosis not present

## 2023-06-13 DIAGNOSIS — I739 Peripheral vascular disease, unspecified: Secondary | ICD-10-CM

## 2023-06-13 DIAGNOSIS — E119 Type 2 diabetes mellitus without complications: Secondary | ICD-10-CM

## 2023-06-13 DIAGNOSIS — L97522 Non-pressure chronic ulcer of other part of left foot with fat layer exposed: Secondary | ICD-10-CM | POA: Diagnosis not present

## 2023-06-13 NOTE — Progress Notes (Signed)
Subjective:  Patient ID: JAYMIE HAENER, male    DOB: 06/21/1956,   MRN: [id number]  Chief Complaint  Patient presents with   Wound Check    Left foot wound check , patient states wound has been painful with discharge    67 y.o. male presents for  follow-up of left foot wound and foreign body removal.  Relates it has been draining and been painful. He has a history of ulceration to the left foot and TMA on right and has healed well in the care of Dr. Lilian Kapur . Denies any other pedal complaints. Denies n/v/f/c.   Past Medical History:  Diagnosis Date   Acute coronary syndrome (HCC)    Anemia    B12 deficiency    Chest pain    CHF (congestive heart failure) (HCC)    Chronic combined systolic and diastolic CHF (congestive heart failure) (HCC)    a. 07/2015 Echo: EF 45-50%, mod LVH, mid apical/antsept AK, Gr 1 DD.   Chronic low back pain    CKD (chronic kidney disease), stage III (HCC)    Constipation    COPD (chronic obstructive pulmonary disease) (HCC)    Coronary artery disease    a. 2012 s/p MI/PCI: s/p DES to mid/dist RCA and overlapping DES to LAD;  b. 07/2015 Cath: LAD 10% ISR, D1 40(jailed), LCX 39m, OM2 30, OM3 30, RCA 58m ISR, 10d ISR; c. 07/2016 NSTEMI/PCI: LAD 85ost/p/m (3.5x20 Synergy DES overlapping prior stent), D1 40, LCX 54m, OM2 95 (2.75x16 Synergy DES), OM3 30, RCA 33m, 10d.   Depression    Diabetic gastroparesis (HCC)    Diabetic polyneuropathy (HCC)    DKA (diabetic ketoacidoses) 03/14/2018   GERD (gastroesophageal reflux disease)    Gout    Heart failure (HCC)    Heart murmur    History of MI (myocardial infarction)    Hyperlipemia    Hypertension    Hypertensive heart disease    Hypokalemia    Ischemic cardiomyopathy    a. 07/2015 Echo: EF 45-50%, mod LVH, mid-apicalanteroseptal AK, Gr1 DD.   Joint pain    Kidney problem    Morbid obesity (HCC)    OSA on CPAP    Osteoarthritis    Osteomyelitis (HCC)    a. 07/2016 MTP joint of R great toe s/p  transmetatarsal amputation.   Other fatigue    Polyneuropathy in diabetes(357.2)    Pulmonary sarcoidosis (HCC)    "problems w/it years ago; no problems anymore" (07/03/2014)   Rectal bleeding    Shortness of breath    Shortness of breath on exertion    Sleep apnea    Stomach ulcer    Swallowing difficulty    Type II diabetes mellitus (HCC)     Objective:  Physical Exam: Vascular: DP/PT pulses 2/4 bilateral. CFT <3 seconds. Normal hair growth on digits. No edema.  Skin. No lacerations or abrasions bilateral feet. Plantar lateral left foot with small hyperkeratotic area upon debridement  about 0.1 cm x 0.1 cm x 0.2 cm  more lateral to previous area of foreign body. Musculoskeletal: MMT 5/5 bilateral lower extremities in DF, PF, Inversion and Eversion. Deceased ROM in DF of ankle joint. TMA on right. Left foot with hyperkeratosis to lateral foot.  Neurological: Sensation intact to light touch.   Assessment:   No diagnosis found.    Plan:  Patient was evaluated and treated and all questions answered. Ulcer with fat layer exposed left lateral plantar foot.  -Debridement as below. -Dressed with betadine,  DSD. -Off-loading with surgical shoe. -No abx indicated.  -Discussed glucose control and proper protein-rich diet.  -Discussed if any worsening redness, pain, fever or chills to call or may need to report to the emergency room. Patient expressed understanding.   Procedure: Excisional Debridement of  foregin body  Rationale: Removal of non-viable soft tissue from the wound to promote healing Anesthesia: none Pre-Debridement Wound Measurements: Overlying hyperkertosis  Post-Debridement Wound Measurements: 0.1 cm x 0.1 cm x 0.2 cm  Type of Debridement: Sharp Excisional of wound Tissue Removed: Non-viable soft tissue and glass Depth of Debridement: subcutaneous tissue. Technique: Sharp excisional debridement to bleeding, viable wound base.  Dressing: Dry, sterile, compression  dressing. Disposition: Patient tolerated procedure well. Patient to return in 2 week for follow-up.  No follow-ups on file.   Louann Sjogren, DPM

## 2023-06-24 ENCOUNTER — Telehealth: Payer: Self-pay | Admitting: Podiatry

## 2023-06-24 NOTE — Telephone Encounter (Signed)
Tried to reach pt to schedule picking up his diabetic shoes, vm is full no answer.

## 2023-07-05 NOTE — Telephone Encounter (Signed)
No answer vm is full tried to reach pt for diabetic shoes  .

## 2023-07-10 ENCOUNTER — Inpatient Hospital Stay (HOSPITAL_COMMUNITY)
Admission: EM | Admit: 2023-07-10 | Discharge: 2023-07-13 | DRG: 638 | Disposition: A | Discharge status: Home / Self Care | Payer: 59 | Attending: Internal Medicine | Admitting: Internal Medicine

## 2023-07-10 ENCOUNTER — Other Ambulatory Visit: Payer: Self-pay

## 2023-07-10 ENCOUNTER — Encounter (HOSPITAL_COMMUNITY): Payer: Self-pay

## 2023-07-10 ENCOUNTER — Emergency Department (HOSPITAL_COMMUNITY): Payer: 59

## 2023-07-10 DIAGNOSIS — G4733 Obstructive sleep apnea (adult) (pediatric): Secondary | ICD-10-CM | POA: Diagnosis present

## 2023-07-10 DIAGNOSIS — R739 Hyperglycemia, unspecified: Secondary | ICD-10-CM

## 2023-07-10 DIAGNOSIS — M109 Gout, unspecified: Secondary | ICD-10-CM | POA: Diagnosis not present

## 2023-07-10 DIAGNOSIS — I130 Hypertensive heart and chronic kidney disease with heart failure and stage 1 through stage 4 chronic kidney disease, or unspecified chronic kidney disease: Secondary | ICD-10-CM | POA: Diagnosis present

## 2023-07-10 DIAGNOSIS — Z8249 Family history of ischemic heart disease and other diseases of the circulatory system: Secondary | ICD-10-CM

## 2023-07-10 DIAGNOSIS — N179 Acute kidney failure, unspecified: Secondary | ICD-10-CM | POA: Diagnosis not present

## 2023-07-10 DIAGNOSIS — N400 Enlarged prostate without lower urinary tract symptoms: Secondary | ICD-10-CM | POA: Diagnosis present

## 2023-07-10 DIAGNOSIS — I2510 Atherosclerotic heart disease of native coronary artery without angina pectoris: Secondary | ICD-10-CM | POA: Diagnosis present

## 2023-07-10 DIAGNOSIS — I10 Essential (primary) hypertension: Secondary | ICD-10-CM | POA: Diagnosis not present

## 2023-07-10 DIAGNOSIS — K219 Gastro-esophageal reflux disease without esophagitis: Secondary | ICD-10-CM | POA: Diagnosis not present

## 2023-07-10 DIAGNOSIS — N1832 Chronic kidney disease, stage 3b: Secondary | ICD-10-CM | POA: Diagnosis present

## 2023-07-10 DIAGNOSIS — J029 Acute pharyngitis, unspecified: Secondary | ICD-10-CM | POA: Diagnosis not present

## 2023-07-10 DIAGNOSIS — I2489 Other forms of acute ischemic heart disease: Secondary | ICD-10-CM | POA: Diagnosis present

## 2023-07-10 DIAGNOSIS — K3184 Gastroparesis: Secondary | ICD-10-CM | POA: Diagnosis present

## 2023-07-10 DIAGNOSIS — E872 Acidosis, unspecified: Secondary | ICD-10-CM | POA: Diagnosis not present

## 2023-07-10 DIAGNOSIS — E1143 Type 2 diabetes mellitus with diabetic autonomic (poly)neuropathy: Secondary | ICD-10-CM | POA: Diagnosis present

## 2023-07-10 DIAGNOSIS — Z89431 Acquired absence of right foot: Secondary | ICD-10-CM | POA: Diagnosis not present

## 2023-07-10 DIAGNOSIS — E08621 Diabetes mellitus due to underlying condition with foot ulcer: Secondary | ICD-10-CM | POA: Diagnosis present

## 2023-07-10 DIAGNOSIS — E871 Hypo-osmolality and hyponatremia: Secondary | ICD-10-CM | POA: Diagnosis not present

## 2023-07-10 DIAGNOSIS — R6510 Systemic inflammatory response syndrome (SIRS) of non-infectious origin without acute organ dysfunction: Secondary | ICD-10-CM | POA: Diagnosis present

## 2023-07-10 DIAGNOSIS — Z79899 Other long term (current) drug therapy: Secondary | ICD-10-CM | POA: Diagnosis not present

## 2023-07-10 DIAGNOSIS — Z833 Family history of diabetes mellitus: Secondary | ICD-10-CM

## 2023-07-10 DIAGNOSIS — R7989 Other specified abnormal findings of blood chemistry: Secondary | ICD-10-CM | POA: Diagnosis present

## 2023-07-10 DIAGNOSIS — E1122 Type 2 diabetes mellitus with diabetic chronic kidney disease: Secondary | ICD-10-CM | POA: Diagnosis not present

## 2023-07-10 DIAGNOSIS — E860 Dehydration: Secondary | ICD-10-CM | POA: Diagnosis present

## 2023-07-10 DIAGNOSIS — Z5900 Homelessness unspecified: Secondary | ICD-10-CM | POA: Diagnosis not present

## 2023-07-10 DIAGNOSIS — Z794 Long term (current) use of insulin: Secondary | ICD-10-CM | POA: Diagnosis not present

## 2023-07-10 DIAGNOSIS — I5042 Chronic combined systolic (congestive) and diastolic (congestive) heart failure: Secondary | ICD-10-CM | POA: Diagnosis not present

## 2023-07-10 DIAGNOSIS — Z7984 Long term (current) use of oral hypoglycemic drugs: Secondary | ICD-10-CM

## 2023-07-10 DIAGNOSIS — E1142 Type 2 diabetes mellitus with diabetic polyneuropathy: Secondary | ICD-10-CM | POA: Diagnosis not present

## 2023-07-10 DIAGNOSIS — E782 Mixed hyperlipidemia: Secondary | ICD-10-CM

## 2023-07-10 DIAGNOSIS — Z1152 Encounter for screening for COVID-19: Secondary | ICD-10-CM

## 2023-07-10 DIAGNOSIS — R1310 Dysphagia, unspecified: Secondary | ICD-10-CM | POA: Diagnosis present

## 2023-07-10 DIAGNOSIS — E11621 Type 2 diabetes mellitus with foot ulcer: Secondary | ICD-10-CM | POA: Diagnosis present

## 2023-07-10 DIAGNOSIS — I255 Ischemic cardiomyopathy: Secondary | ICD-10-CM | POA: Diagnosis present

## 2023-07-10 DIAGNOSIS — J449 Chronic obstructive pulmonary disease, unspecified: Secondary | ICD-10-CM | POA: Diagnosis not present

## 2023-07-10 DIAGNOSIS — Z6837 Body mass index (BMI) 37.0-37.9, adult: Secondary | ICD-10-CM

## 2023-07-10 DIAGNOSIS — L97422 Non-pressure chronic ulcer of left heel and midfoot with fat layer exposed: Secondary | ICD-10-CM | POA: Diagnosis not present

## 2023-07-10 DIAGNOSIS — Z7985 Long-term (current) use of injectable non-insulin antidiabetic drugs: Secondary | ICD-10-CM

## 2023-07-10 DIAGNOSIS — E1110 Type 2 diabetes mellitus with ketoacidosis without coma: Secondary | ICD-10-CM | POA: Diagnosis not present

## 2023-07-10 DIAGNOSIS — F329 Major depressive disorder, single episode, unspecified: Secondary | ICD-10-CM | POA: Diagnosis present

## 2023-07-10 DIAGNOSIS — Z888 Allergy status to other drugs, medicaments and biological substances status: Secondary | ICD-10-CM

## 2023-07-10 DIAGNOSIS — Z955 Presence of coronary angioplasty implant and graft: Secondary | ICD-10-CM

## 2023-07-10 DIAGNOSIS — E785 Hyperlipidemia, unspecified: Secondary | ICD-10-CM | POA: Diagnosis present

## 2023-07-10 DIAGNOSIS — Z91199 Patient's noncompliance with other medical treatment and regimen due to unspecified reason: Secondary | ICD-10-CM

## 2023-07-10 DIAGNOSIS — Z7982 Long term (current) use of aspirin: Secondary | ICD-10-CM

## 2023-07-10 DIAGNOSIS — I252 Old myocardial infarction: Secondary | ICD-10-CM

## 2023-07-10 DIAGNOSIS — Z0389 Encounter for observation for other suspected diseases and conditions ruled out: Secondary | ICD-10-CM | POA: Diagnosis not present

## 2023-07-10 LAB — PROTIME-INR
INR: 1.1 (ref 0.8–1.2)
Prothrombin Time: 14.3 seconds (ref 11.4–15.2)

## 2023-07-10 LAB — I-STAT VENOUS BLOOD GAS, ED
Acid-Base Excess: 1 mmol/L (ref 0.0–2.0)
Bicarbonate: 23.7 mmol/L (ref 20.0–28.0)
Calcium, Ion: 1.06 mmol/L — ABNORMAL LOW (ref 1.15–1.40)
HCT: 46 % (ref 39.0–52.0)
Hemoglobin: 15.6 g/dL (ref 13.0–17.0)
O2 Saturation: 39 %
Potassium: 4.2 mmol/L (ref 3.5–5.1)
Sodium: 129 mmol/L — ABNORMAL LOW (ref 135–145)
TCO2: 25 mmol/L (ref 22–32)
pCO2, Ven: 33 mmHg — ABNORMAL LOW (ref 44–60)
pH, Ven: 7.463 — ABNORMAL HIGH (ref 7.25–7.43)
pO2, Ven: 21 mmHg — CL (ref 32–45)

## 2023-07-10 LAB — CBC WITH DIFFERENTIAL/PLATELET
Abs Immature Granulocytes: 0.1 10*3/uL — ABNORMAL HIGH (ref 0.00–0.07)
Basophils Absolute: 0 10*3/uL (ref 0.0–0.1)
Basophils Relative: 0 %
Eosinophils Absolute: 0 10*3/uL (ref 0.0–0.5)
Eosinophils Relative: 0 %
HCT: 46.7 % (ref 39.0–52.0)
Hemoglobin: 15 g/dL (ref 13.0–17.0)
Immature Granulocytes: 1 %
Lymphocytes Relative: 6 %
Lymphs Abs: 0.8 10*3/uL (ref 0.7–4.0)
MCH: 26.5 pg (ref 26.0–34.0)
MCHC: 32.1 g/dL (ref 30.0–36.0)
MCV: 82.7 fL (ref 80.0–100.0)
Monocytes Absolute: 0.9 10*3/uL (ref 0.1–1.0)
Monocytes Relative: 7 %
Neutro Abs: 11.9 10*3/uL — ABNORMAL HIGH (ref 1.7–7.7)
Neutrophils Relative %: 86 %
Platelets: 248 10*3/uL (ref 150–400)
RBC: 5.65 MIL/uL (ref 4.22–5.81)
RDW: 17.1 % — ABNORMAL HIGH (ref 11.5–15.5)
WBC: 13.7 10*3/uL — ABNORMAL HIGH (ref 4.0–10.5)
nRBC: 0 % (ref 0.0–0.2)

## 2023-07-10 LAB — COMPREHENSIVE METABOLIC PANEL
ALT: 10 U/L (ref 0–44)
AST: 16 U/L (ref 15–41)
Albumin: 3.2 g/dL — ABNORMAL LOW (ref 3.5–5.0)
Alkaline Phosphatase: 221 U/L — ABNORMAL HIGH (ref 38–126)
Anion gap: 19 — ABNORMAL HIGH (ref 5–15)
BUN: 50 mg/dL — ABNORMAL HIGH (ref 8–23)
CO2: 21 mmol/L — ABNORMAL LOW (ref 22–32)
Calcium: 9.4 mg/dL (ref 8.9–10.3)
Chloride: 89 mmol/L — ABNORMAL LOW (ref 98–111)
Creatinine, Ser: 3.14 mg/dL — ABNORMAL HIGH (ref 0.61–1.24)
GFR, Estimated: 21 mL/min — ABNORMAL LOW (ref >60)
Glucose, Bld: 711 mg/dL (ref 70–99)
Potassium: 4.2 mmol/L (ref 3.5–5.1)
Sodium: 129 mmol/L — ABNORMAL LOW (ref 135–145)
Total Bilirubin: 0.6 mg/dL (ref 0.3–1.2)
Total Protein: 8.4 g/dL — ABNORMAL HIGH (ref 6.5–8.1)

## 2023-07-10 LAB — TROPONIN I (HIGH SENSITIVITY)
Troponin I (High Sensitivity): 25 ng/L — ABNORMAL HIGH (ref <18)
Troponin I (High Sensitivity): 21 ng/L — ABNORMAL HIGH (ref <18)

## 2023-07-10 LAB — I-STAT CG4 LACTIC ACID, ED
Lactic Acid, Venous: 5.1 mmol/L (ref 0.5–1.9)
Lactic Acid, Venous: 4.3 mmol/L (ref 0.5–1.9)

## 2023-07-10 LAB — MAGNESIUM: Magnesium: 2.5 mg/dL — ABNORMAL HIGH (ref 1.7–2.4)

## 2023-07-10 LAB — SARS CORONAVIRUS 2 BY RT PCR: SARS Coronavirus 2 by RT PCR: NEGATIVE

## 2023-07-10 LAB — CBG MONITORING, ED: Glucose-Capillary: >600 mg/dL (ref 70–99)

## 2023-07-10 LAB — LIPASE, BLOOD: Lipase: 65 U/L — ABNORMAL HIGH (ref 11–51)

## 2023-07-10 LAB — APTT: aPTT: 35 seconds (ref 24–36)

## 2023-07-10 LAB — SALICYLATE LEVEL: Salicylate Lvl: <7 mg/dL — ABNORMAL LOW (ref 7.0–30.0)

## 2023-07-10 LAB — BETA-HYDROXYBUTYRIC ACID: Beta-Hydroxybutyric Acid: 0.72 mmol/L — ABNORMAL HIGH (ref 0.05–0.27)

## 2023-07-10 MED ORDER — ATORVASTATIN CALCIUM 80 MG PO TABS
80.0000 mg | ORAL_TABLET | Freq: Every day | ORAL | Status: DC
  Administered 2023-07-11 – 2023-07-13 (×3): 80 mg via ORAL
  Filled 2023-07-10: qty 1
  Filled 2023-07-10: qty 2
  Filled 2023-07-10: qty 1

## 2023-07-10 MED ORDER — FAMOTIDINE 20 MG PO TABS
20.0000 mg | ORAL_TABLET | Freq: Every day | ORAL | Status: DC
  Administered 2023-07-11 – 2023-07-12 (×2): 20 mg via ORAL
  Filled 2023-07-10 (×2): qty 1

## 2023-07-10 MED ORDER — POTASSIUM CHLORIDE 10 MEQ/100ML IV SOLN
10.0000 meq | INTRAVENOUS | Status: AC
  Administered 2023-07-10 – 2023-07-11 (×2): 10 meq via INTRAVENOUS
  Filled 2023-07-10 (×2): qty 100

## 2023-07-10 MED ORDER — DEXTROSE IN LACTATED RINGERS 5 % IV SOLN: INTRAVENOUS | Status: DC

## 2023-07-10 MED ORDER — SODIUM CHLORIDE 0.45 % IV SOLN
INTRAVENOUS | Status: DC
  Filled 2023-07-10 (×2): qty 1000

## 2023-07-10 MED ORDER — KCL IN DEXTROSE-NACL 10-5-0.45 MEQ/L-%-% IV SOLN
INTRAVENOUS | Status: DC
  Filled 2023-07-10: qty 1000

## 2023-07-10 MED ORDER — ONDANSETRON HCL 4 MG/2ML IJ SOLN
4.0000 mg | Freq: Four times a day (QID) | INTRAMUSCULAR | Status: DC | PRN
  Administered 2023-07-10: 4 mg via INTRAVENOUS
  Filled 2023-07-10: qty 2

## 2023-07-10 MED ORDER — ASPIRIN 81 MG PO TBEC
81.0000 mg | DELAYED_RELEASE_TABLET | Freq: Every day | ORAL | Status: DC
  Administered 2023-07-11 – 2023-07-13 (×3): 81 mg via ORAL
  Filled 2023-07-10 (×3): qty 1

## 2023-07-10 MED ORDER — LACTATED RINGERS IV BOLUS
1000.0000 mL | Freq: Once | INTRAVENOUS | Status: AC
  Administered 2023-07-10: 1000 mL via INTRAVENOUS

## 2023-07-10 MED ORDER — LORAZEPAM 2 MG/ML IJ SOLN
0.5000 mg | Freq: Four times a day (QID) | INTRAMUSCULAR | Status: DC | PRN
  Administered 2023-07-10: 0.5 mg via INTRAVENOUS
  Filled 2023-07-10: qty 1

## 2023-07-10 MED ORDER — METOCLOPRAMIDE HCL 5 MG/ML IJ SOLN
10.0000 mg | Freq: Once | INTRAMUSCULAR | Status: AC
  Administered 2023-07-10: 10 mg via INTRAVENOUS
  Filled 2023-07-10: qty 2

## 2023-07-10 MED ORDER — ACETAMINOPHEN 650 MG RE SUPP: 650.0000 mg | Freq: Four times a day (QID) | RECTAL | Status: DC | PRN

## 2023-07-10 MED ORDER — PHENOL 1.4 % MT LIQD: 1.0000 | OROMUCOSAL | Status: DC | PRN

## 2023-07-10 MED ORDER — LATANOPROST 0.005 % OP SOLN
1.0000 [drp] | Freq: Every day | OPHTHALMIC | Status: DC
  Administered 2023-07-11 – 2023-07-12 (×3): 1 [drp] via OPHTHALMIC
  Filled 2023-07-10: qty 2.5

## 2023-07-10 MED ORDER — PANTOPRAZOLE SODIUM 40 MG IV SOLR
40.0000 mg | Freq: Every day | INTRAVENOUS | Status: DC
  Administered 2023-07-10 – 2023-07-11 (×2): 40 mg via INTRAVENOUS
  Filled 2023-07-10 (×2): qty 10

## 2023-07-10 MED ORDER — LACTATED RINGERS IV BOLUS (SEPSIS)
1000.0000 mL | Freq: Once | INTRAVENOUS | Status: AC
  Administered 2023-07-10: 1000 mL via INTRAVENOUS

## 2023-07-10 MED ORDER — ONDANSETRON HCL 4 MG/2ML IJ SOLN
4.0000 mg | Freq: Once | INTRAMUSCULAR | Status: AC
  Administered 2023-07-10: 4 mg via INTRAVENOUS
  Filled 2023-07-10: qty 2

## 2023-07-10 MED ORDER — INSULIN REGULAR(HUMAN) IN NACL 100-0.9 UT/100ML-% IV SOLN
INTRAVENOUS | Status: DC
  Administered 2023-07-10: 11.5 [IU]/h via INTRAVENOUS
  Filled 2023-07-10: qty 100

## 2023-07-10 MED ORDER — MELATONIN 3 MG PO TABS
3.0000 mg | ORAL_TABLET | Freq: Every evening | ORAL | Status: DC | PRN
  Filled 2023-07-10: qty 1

## 2023-07-10 MED ORDER — MENTHOL 3 MG MT LOZG
1.0000 | LOZENGE | OROMUCOSAL | Status: DC | PRN
  Administered 2023-07-11: 3 mg via ORAL
  Filled 2023-07-10: qty 9

## 2023-07-10 MED ORDER — DEXTROSE 50 % IV SOLN: 0.0000 mL | INTRAVENOUS | Status: DC | PRN

## 2023-07-10 MED ORDER — ACETAMINOPHEN 325 MG PO TABS
650.0000 mg | ORAL_TABLET | Freq: Four times a day (QID) | ORAL | Status: DC | PRN
  Administered 2023-07-11 – 2023-07-12 (×2): 650 mg via ORAL
  Filled 2023-07-10 (×2): qty 2

## 2023-07-10 MED ORDER — LACTATED RINGERS IV SOLN: INTRAVENOUS | Status: DC

## 2023-07-10 NOTE — ED Triage Notes (Signed)
Pt reports feeling weak associated with vomiting that started yesterday, states he is unable to eat or drink anything. He reports sore throat x 4 days. He also reports having an open wound to left foot.

## 2023-07-10 NOTE — H&P (Signed)
History and Physical      Joshua [name] DDU:[id number] DOB: 03-21-1956 DOA: 07/10/2023; DOS: 07/10/2023  PCP: Ceasar Lund, PA  Patient coming from: home   I have personally briefly reviewed patient's old medical records in [facility]  Chief Complaint: Nausea vomiting  HPI: Joshua [name] is a 67 y.o. male with medical history significant for type 2 diabetes mellitus complicated by previous episodes of DKA, as well as diabetic peripheral polyneuropathy and diabetic gastroparesis, essential hypertension, hyperlipidemia, CKD 3B with baseline creatinine 1.5-2.0, who is admitted to [facility] on 07/10/2023 with DKA after presenting from home to [facility] ED complaining of nausea/vomiting.   The patient reports 1 to 2 days of intermittent nausea resulting in at least 3-4 episodes of nonbloody, nonbilious emesis over that timeframe, with most recent such episode occurring in the emergency department this evening.  After the first few episodes of vomiting, with associated decline in oral intake over the last day, the patient notes some mild lightheadedness, in the absence of presyncope or syncope.  He denies any associated acute focal neurologic deficits, including no acute focal weakness, acute focal numbness, paresthesias, facial droop, dysarthria, expressive aphasia, acute change in vision, acute change in hearing, dysphagia, vertigo.  Denies any abdominal pain, diarrhea, melena, or hematochezia.  Over the last day, he is also noted a sore throat, without any associated cough.  Denies any associated subjective fever, chills, rigors, generalized myalgias.  No recent shortness of breath.  No rash.  No recent headache, neck stiffness, dysuria or gross hematuria.  Also denies any recent chest pain, palpitations, diaphoresis.  He has a documented history of type 2 diabetes mellitus, but has had multiple previous episodes of DKA.  Reports compliance with his outpatient insulin regimen, and  most recent hemoglobin A1c was noted to be 7.8% on 04/28/2023.    ED Course:  Vital signs in the ED were notable for the following: Afebrile; heart rates in the 70s to low 100s; initial blood pressure 85/61 subsequently increasing with IV fluids, with most recent blood pressure noted to be 142/85; respiratory rate 14-21, oxygen saturation 97 to 100% on room air.  Labs were notable for the following: CMP notable for the following: Sodium 129, which corrects to approximately 138 when taking into account concomitant hyperglycemia, potassium 4.2, bicarbonate 21, anion gap 19, creatinine 3.14 compared to most recent prior value of 2.04 on 12/10/2023, glucose 711, alkaline phosphatase 221, otherwise, liver enzymes within normal limits.  Lipase 65.  High-sensitivity troponin I initially 25 with repeat value pending, which is relative to most recent prior high sensitive troponin I value of 41-12 1623.  Lactic acid initially 5.1 with repeat value trending down to 4.4.  CBC notable for white blood cell count 13,700 with 86% neutrophils.  Urinalysis pending.  VBG pending.  Beta hydroxybutyric acid 0.72.  Blood cultures x 2 collected.  Group A strep PCR pending.  COVID-19 PCR pending.  EKG has been performed, but not yet released, but is reported to show sinus rhythm without evidence of overt acute ischemic changes.  Imaging in the ED, per corresponding formal radiology read, was notable for the following: 1 view chest x-ray showed no evidence of acute cardiopulmonary process, including no evidence of infiltrate, edema, effusion, or pneumothorax.  While in the ED, the following were administered: Zofran 4 mg IV x 1, Reglan 10 mg IV x 1, insulin drip initiated, lactated Ringer's x 2 L bolus followed by continuous lactated Ringer's at 125  cc/h, potassium chloride 20 mill equivalents over 2 hours.  Subsequently, the patient was admitted for further evaluation management of presenting DKA, with clinical suspicion for  pharyngitis, with presenting labs notable for lactic acidosis, SIRS criteria, acute kidney injury superimposed on CKD 3B, and mildly elevated troponin.     Review of Systems: As per HPI otherwise 10 point review of systems negative.   Past Medical History:  Diagnosis Date   Acute coronary syndrome (HCC)    Anemia    B12 deficiency    Chest pain    CHF (congestive heart failure) (HCC)    Chronic combined systolic and diastolic CHF (congestive heart failure) (HCC)    a. 07/2015 Echo: EF 45-50%, mod LVH, mid apical/antsept AK, Gr 1 DD.   Chronic low back pain    CKD (chronic kidney disease), stage III (HCC)    Constipation    COPD (chronic obstructive pulmonary disease) (HCC)    Coronary artery disease    a. 2012 s/p MI/PCI: s/p DES to mid/dist RCA and overlapping DES to LAD;  b. 07/2015 Cath: LAD 10% ISR, D1 40(jailed), LCX 51m, OM2 30, OM3 30, RCA 53m ISR, 10d ISR; c. 07/2016 NSTEMI/PCI: LAD 85ost/p/m (3.5x20 Synergy DES overlapping prior stent), D1 40, LCX 54m, OM2 95 (2.75x16 Synergy DES), OM3 30, RCA 9m, 10d.   Depression    Diabetic gastroparesis (HCC)    Diabetic polyneuropathy (HCC)    DKA (diabetic ketoacidoses) 03/14/2018   GERD (gastroesophageal reflux disease)    Gout    Heart failure (HCC)    Heart murmur    History of MI (myocardial infarction)    Hyperlipemia    Hypertension    Hypertensive heart disease    Hypokalemia    Ischemic cardiomyopathy    a. 07/2015 Echo: EF 45-50%, mod LVH, mid-apicalanteroseptal AK, Gr1 DD.   Joint pain    Kidney problem    Morbid obesity (HCC)    OSA on CPAP    Osteoarthritis    Osteomyelitis (HCC)    a. 07/2016 MTP joint of R great toe s/p transmetatarsal amputation.   Other fatigue    Polyneuropathy in diabetes(357.2)    Pulmonary sarcoidosis (HCC)    "problems w/it years ago; no problems anymore" (07/03/2014)   Rectal bleeding    Shortness of breath    Shortness of breath on exertion    Sleep apnea    Stomach ulcer     Swallowing difficulty    Type II diabetes mellitus (HCC)     Past Surgical History:  Procedure Laterality Date   ABDOMINAL AORTOGRAM W/LOWER EXTREMITY N/A 11/29/2022   Procedure: ABDOMINAL AORTOGRAM W/LOWER EXTREMITY;  Surgeon: Maeola Harman, MD;  Location: [facility] INVASIVE CV LAB;  Service: Cardiovascular;  Laterality: N/A;   AMPUTATION Right 07/24/2016   Procedure: TRANSMETATARSAL FOOT AMPUTATION;  Surgeon: Nadara Mustard, MD;  Location: [facility] OR;  Service: Orthopedics;  Laterality: Right;   APPLICATION OF WOUND VAC Left 11/30/2022   Procedure: APPLICATION OF WOUND VAC;  Surgeon: Edwin Cap, DPM;  Location: [facility] OR;  Service: Podiatry;  Laterality: Left;   BALLOON DILATION N/A 03/21/2018   Procedure: BALLOON DILATION;  Surgeon: Bernette Redbird, MD;  Location: [facility] ENDOSCOPY;  Service: Endoscopy;  Laterality: N/A;   BIOPSY  03/16/2022   Procedure: BIOPSY;  Surgeon: Charlott Rakes, MD;  Location: [facility] ENDOSCOPY;  Service: Endoscopy;;   BREAST LUMPECTOMY Right ~ 2000   "benign"   CARDIAC CATHETERIZATION N/A 07/30/2015   Procedure: Left Heart Cath and  Coronary Angiography;  Surgeon: Kathleene Hazel, MD;  Location: [facility] INVASIVE CV LAB;  Service: Cardiovascular;  Laterality: N/A;   CARDIAC CATHETERIZATION N/A 07/19/2016   Procedure: Left Heart Cath and Coronary Angiography;  Surgeon: Lyn Records, MD;  Location: [facility] INVASIVE CV LAB;  Service: Cardiovascular;  Laterality: N/A;   CARDIAC CATHETERIZATION N/A 07/29/2016   Procedure: Coronary Stent Intervention;  Surgeon: Lyn Records, MD;  Location: [facility] INVASIVE CV LAB;  Service: Cardiovascular;  Laterality: N/A;   CARDIAC CATHETERIZATION N/A 07/29/2016   Procedure: Intravascular Ultrasound/IVUS;  Surgeon: Lyn Records, MD;  Location: [facility] INVASIVE CV LAB;  Service: Cardiovascular;  Laterality: N/A;   CATARACT EXTRACTION W/ INTRAOCULAR LENS  IMPLANT, BILATERAL Bilateral 2014-2015   COLONOSCOPY WITH PROPOFOL N/A 08/22/2017   Procedure: COLONOSCOPY  WITH PROPOFOL;  Surgeon: Charlott Rakes, MD;  Location: [facility] ENDOSCOPY;  Service: Endoscopy;  Laterality: N/A;   COLONOSCOPY WITH PROPOFOL N/A 03/16/2022   Procedure: COLONOSCOPY WITH PROPOFOL;  Surgeon: Charlott Rakes, MD;  Location: [facility] ENDOSCOPY;  Service: Endoscopy;  Laterality: N/A;   CORONARY ANGIOPLASTY WITH STENT PLACEMENT  10/31/2011   30 % MID ATRIOVENTRICULAR GROOVE CX STENOSIS. 90 % MID RCA.PTA TO LAD/STENTING OF TANDEM 60-70%. LAD STENOSIS 99% REDUCED TO 0%.3.0 X PROMUS DES POSTDILATED TO 3.25MM.   CORONARY ANGIOPLASTY WITH STENT PLACEMENT  07/03/2014   "2"   CORONARY STENT INTERVENTION N/A 04/09/2019   Procedure: CORONARY STENT INTERVENTION;  Surgeon: Corky Crafts, MD;  Location: [facility] INVASIVE CV LAB;  Service: Cardiovascular;  Laterality: N/A;   CORONARY STENT INTERVENTION N/A 08/22/2019   Procedure: CORONARY STENT INTERVENTION;  Surgeon: Swaziland, Peter M, MD;  Location: [facility] INVASIVE CV LAB;  Service: Cardiovascular;  Laterality: N/A;   ESOPHAGOGASTRODUODENOSCOPY (EGD) WITH PROPOFOL N/A 03/21/2018   Procedure: ESOPHAGOGASTRODUODENOSCOPY (EGD) WITH PROPOFOL;  Surgeon: Bernette Redbird, MD;  Location: [facility] ENDOSCOPY;  Service: Endoscopy;  Laterality: N/A;   ESOPHAGOGASTRODUODENOSCOPY (EGD) WITH PROPOFOL N/A 03/16/2022   Procedure: ESOPHAGOGASTRODUODENOSCOPY (EGD) WITH PROPOFOL;  Surgeon: Charlott Rakes, MD;  Location: [facility] ENDOSCOPY;  Service: Endoscopy;  Laterality: N/A;   GRAFT APPLICATION Left 11/30/2022   Procedure: GRAFT APPLICATION;  Surgeon: Edwin Cap, DPM;  Location: [facility] OR;  Service: Podiatry;  Laterality: Left;   I & D EXTREMITY Right 10/30/2013   Procedure: IRRIGATION AND DEBRIDEMENT RIGHT SMALL FINGER POSSIBLE SECONDARY WOUND CLOSURE;  Surgeon: Marlowe Shores, MD;  Location: [facility] ORS;  Service: Orthopedics;  Laterality: Right;  Mina Marble is available after 4pm   I & D EXTREMITY Right 11/01/2013   Procedure:  IRRIGATION AND DEBRIDEMENT RIGHT  PROXIMAL PHALANGEAL  LEVEL AMPUTATION;  Surgeon: Marlowe Shores, MD;  Location: [facility] ORS;  Service: Orthopedics;  Laterality: Right;   INCISION AND DRAINAGE Right 10/28/2013   Procedure: INCISION AND DRAINAGE Right small finger;  Surgeon: Marlowe Shores, MD;  Location: [facility] ORS;  Service: Orthopedics;  Laterality: Right;   INCISION AND DRAINAGE OF WOUND Left 11/07/2022   Procedure: IRRIGATION AND DEBRIDEMENT WOUND;  Surgeon: Pilar Plate, DPM;  Location: [facility] OR;  Service: Podiatry;  Laterality: Left;  Left foot ucler debridement and bone biopsy   LEFT HEART CATH Left 11/03/2011   Procedure: LEFT HEART CATH;  Surgeon: Lennette Bihari, MD;  Location: [facility] CATH LAB;  Service: Cardiovascular;  Laterality: Left;   LEFT HEART CATH AND CORONARY ANGIOGRAPHY N/A 04/09/2019   Procedure: LEFT HEART CATH AND CORONARY ANGIOGRAPHY;  Surgeon: Corky Crafts, MD;  Location: [facility] INVASIVE CV LAB;  Service: Cardiovascular;  Laterality: N/A;   LEFT HEART CATHETERIZATION WITH CORONARY ANGIOGRAM N/A 07/03/2014   Procedure: LEFT HEART CATHETERIZATION WITH CORONARY ANGIOGRAM;  Surgeon: Lesleigh Noe, MD;  Location: [facility] CATH LAB;  Service: Cardiovascular;  Laterality: N/A;   PERCUTANEOUS CORONARY STENT INTERVENTION (PCI-S) Left 11/03/2011   Procedure: PERCUTANEOUS CORONARY STENT INTERVENTION (PCI-S);  Surgeon: Lennette Bihari, MD;  Location: [facility] CATH LAB;  Service: Cardiovascular;  Laterality: Left;   RIGHT/LEFT HEART CATH AND CORONARY ANGIOGRAPHY N/A 08/22/2019   Procedure: RIGHT/LEFT HEART CATH AND CORONARY ANGIOGRAPHY;  Surgeon: Swaziland, Peter M, MD;  Location: [facility] INVASIVE CV LAB;  Service: Cardiovascular;  Laterality: N/A;   TOE AMPUTATION Right ~ 2010   "2nd toe"   TOE AMPUTATION Right 2012   "3rd toe"   WOUND DEBRIDEMENT Left 11/30/2022   Procedure: DEBRIDEMENT WOUND;  Surgeon: Edwin Cap, DPM;  Location: [facility] OR;  Service: Podiatry;  Laterality: Left;    Social History:  reports that he has never smoked. He has  never used smokeless tobacco. He reports current alcohol use. He reports that he does not use drugs.   Allergies  Allergen Reactions   Chlorthalidone Other (See Comments)    Causes dehydration, kidneys shut down    Family History  Problem Relation Age of Onset   Sudden death Mother    Heart attack Maternal Aunt    Diabetes Maternal Grandmother    Hypertension Maternal Grandmother    Sudden death Other    Neuropathy Neg Hx     Family history reviewed and not pertinent    Prior to Admission medications   Medication Sig Start Date End Date Taking? Authorizing Provider  allopurinol (ZYLOPRIM) 100 MG tablet Take 1 tablet (100 mg total) by mouth daily. 11/17/22   Glade Lloyd, MD  ascorbic acid (VITAMIN C) 500 MG tablet Take 1 tablet (500 mg total) by mouth 2 (two) times daily. 11/16/22   Glade Lloyd, MD  aspirin EC 81 MG tablet Take 81 mg by mouth daily.     [provider]  atorvastatin (LIPITOR) 80 MG tablet Take 80 mg by mouth daily. 08/29/22   [provider]  Continuous Blood Gluc Receiver (FREESTYLE LIBRE 2 READER) DEVI See admin instructions. 11/23/22   [provider]  Continuous Blood Gluc Sensor (FREESTYLE LIBRE 2 SENSOR) MISC APPLY TOPICALLY EVERY 14 DAYS 11/01/22   Carlus Pavlov, MD  dapagliflozin propanediol (FARXIGA) 10 MG TABS tablet Take 1 tablet (10 mg total) by mouth daily before breakfast. 12/28/22   Carlus Pavlov, MD  DULoxetine (CYMBALTA) 20 MG capsule Take 20 mg by mouth 2 (two) times daily. 04/10/23   [provider]  escitalopram (LEXAPRO) 10 MG tablet Take 10 mg by mouth daily. 04/20/23   [provider]  famotidine (PEPCID) 20 MG tablet Take 20 mg by mouth at bedtime. 11/27/20   [provider]  furosemide (LASIX) 40 MG tablet Take 1 tablet (40 mg total) by mouth 2 (two) times daily. 12/08/22 04/26/23  Zigmund Daniel., MD  gabapentin (NEURONTIN) 100 MG capsule Take 1 capsule by mouth at bedtime  04/28/23   Butch Penny, NP  insulin aspart (NOVOLOG) 100 UNIT/ML injection Inject 0-9 Units into the skin 3 (three) times daily with meals. CBG < 70: treat for low blood sugar Implement  CBG 70 - 120: 0 units  CBG 121 - 150: 1 unit  CBG 151 - 200: 2 units  CBG 201 - 250: 3 units  CBG 251 - 300: 5  units  CBG 301 - 350: 7 units  CBG 351 - 400: 9 units  CBG > 400: call MD 12/08/22   Zigmund Daniel., MD  insulin aspart (NOVOLOG) 100 UNIT/ML injection Inject 3 Units into the skin 3 (three) times daily with meals. Hold for NPO or consuming less than 50% of meals 12/09/22   Zigmund Daniel., MD  insulin glargine (LANTUS SOLOSTAR) 100 UNIT/ML Solostar Pen Inject 20 units under skin in am and 15 units at night 04/28/23   Carlus Pavlov, MD  Insulin Lispro (HUMALOG IJ) Inject as directed.    [provider]  isosorbide mononitrate (IMDUR) 60 MG 24 hr tablet Take 120 mg (two tablets) in the morning and 60 mg (one tablet) in the evening. 05/02/23   Lennette Bihari, MD  latanoprost (XALATAN) 0.005 % ophthalmic solution Place 1 drop into both eyes at bedtime.    [provider]  melatonin 3 MG TABS tablet Take 3 mg by mouth at bedtime.    [provider]  metoprolol succinate (TOPROL-XL) 25 MG 24 hr tablet Take 3 tablets (75 mg total) by mouth daily. 12/09/22 04/26/23  Zigmund Daniel., MD  nitroGLYCERIN (NITROSTAT) 0.4 MG SL tablet Place 1 tablet (0.4 mg total) under the tongue every 5 (five) minutes as needed for chest pain. Patient not taking: Reported on 05/02/2023 09/29/22   Lennette Bihari, MD  omeprazole (PRILOSEC) 20 MG capsule Take 20 mg by mouth daily.    [provider]  oxyCODONE-acetaminophen (PERCOCET/ROXICET) 5-325 MG tablet Take 1 tablet by mouth every 6 (six) hours as needed. 12/31/22   [provider]  pantoprazole (PROTONIX) 40 MG tablet TAKE 1 TABLET BY MOUTH TWICE DAILY BEFORE MEAL(S) 09/19/19   Lennette Bihari, MD   polyethylene glycol powder (GLYCOLAX/MIRALAX) 17 GM/SCOOP powder Take 1 capful  (17 g) by mouth daily as needed for moderate constipation. 11/16/22   Glade Lloyd, MD  Semaglutide, 1 MG/DOSE, (OZEMPIC, 1 MG/DOSE,) 4 MG/3ML SOPN Inject 1 mg into the skin once a week. 12/28/22   Carlus Pavlov, MD  senna-docusate (SENOKOT-S) 8.6-50 MG tablet Take 1 tablet by mouth 2 (two) times daily. 11/16/22   Glade Lloyd, MD  tamsulosin (FLOMAX) 0.4 MG CAPS capsule Take 0.4 mg by mouth daily after supper. Prn for urinary tract symptoms    [provider]  ticagrelor (BRILINTA) 90 MG TABS tablet Take 1 tablet by mouth twice daily Patient taking differently: Take 90 mg by mouth 2 (two) times daily. 09/30/22   Lennette Bihari, MD  VITAMIN D, CHOLECALCIFEROL, PO Take by mouth.    [provider]     Objective    Physical Exam: Vitals:   07/10/23 2045 07/10/23 2115 07/10/23 2145 07/10/23 2215  BP: (!) 128/94 (!) 142/85 (!) 162/120 (!) 125/36  Pulse: 77 78 (!) 101 82  Resp: 14 19 (!) 31 (!) 21  Temp:      TempSrc:      SpO2: 100% 97% 90% 100%  Weight:      Height:        General: appears to be stated age; alert, oriented Skin: warm, dry, no rash Head:  AT/[place] Mouth:  Oral mucosa membranes appear dry, normal dentition Neck: supple; trachea midline Throat: Mild pharyngeal erythema noted in the absence of exudate; no evidence of uvula asymmetry Heart:  RRR; did not appreciate any M/R/G Lungs: CTAB, did not appreciate any wheezes, rales, or rhonchi Abdomen: + BS; soft, ND,  NT Vascular: 2+ pedal pulses b/l; 2+ radial pulses b/l Extremities: no peripheral edema, no muscle wasting Neuro: strength and sensation intact in upper and lower extremities b/l    Labs on Admission: I have personally reviewed following labs and imaging studies  CBC: Recent Labs  Lab 07/10/23 2018 07/10/23 2249  WBC 13.7*  --   NEUTROABS 11.9*  --   HGB 15.0 15.6  HCT 46.7 46.0  MCV 82.7  --    PLT 248  --    Basic Metabolic Panel: Recent Labs  Lab 07/10/23 2018 07/10/23 2249  NA 129* 129*  K 4.2 4.2  CL 89*  --   CO2 21*  --   GLUCOSE 711*  --   BUN 50*  --   CREATININE 3.14*  --   CALCIUM 9.4  --    GFR: Estimated Creatinine Clearance: 29.8 mL/min (A) (by C-G formula based on SCr of 3.14 mg/dL (H)). Liver Function Tests: Recent Labs  Lab 07/10/23 2018  AST 16  ALT 10  ALKPHOS 221*  BILITOT 0.6  PROT 8.4*  ALBUMIN 3.2*   Recent Labs  Lab 07/10/23 2018  LIPASE 65*   No results for input(s): "AMMONIA" in the last 168 hours. Coagulation Profile: Recent Labs  Lab 07/10/23 2018  INR 1.1   Cardiac Enzymes: No results for input(s): "CKTOTAL", "CKMB", "CKMBINDEX", "TROPONINI" in the last 168 hours. BNP (last 3 results) No results for input(s): "PROBNP" in the last 8760 hours. HbA1C: No results for input(s): "HGBA1C" in the last 72 hours. CBG: No results for input(s): "GLUCAP" in the last 168 hours. Lipid Profile: No results for input(s): "CHOL", "HDL", "LDLCALC", "TRIG", "CHOLHDL", "LDLDIRECT" in the last 72 hours. Thyroid Function Tests: No results for input(s): "TSH", "T4TOTAL", "FREET4", "T3FREE", "THYROIDAB" in the last 72 hours. Anemia Panel: No results for input(s): "VITAMINB12", "FOLATE", "FERRITIN", "TIBC", "IRON", "RETICCTPCT" in the last 72 hours. Urine analysis:    Component Value Date/Time   COLORURINE STRAW (A) 12/05/2022 1421   APPEARANCEUR CLEAR 12/05/2022 1421   LABSPEC 1.005 12/05/2022 1421   PHURINE 5.0 12/05/2022 1421   GLUCOSEU NEGATIVE 12/05/2022 1421   HGBUR NEGATIVE 12/05/2022 1421   BILIRUBINUR NEGATIVE 12/05/2022 1421   KETONESUR NEGATIVE 12/05/2022 1421   PROTEINUR NEGATIVE 12/05/2022 1421   UROBILINOGEN 0.2 11/01/2011 1410   NITRITE NEGATIVE 12/05/2022 1421   LEUKOCYTESUR NEGATIVE 12/05/2022 1421    Radiological Exams on Admission: DG Chest Port 1 View  Result Date: 07/10/2023 CLINICAL DATA:  Questionable  sepsis - evaluate for abnormality EXAM: PORTABLE CHEST 1 VIEW COMPARISON:  Chest x-ray 08/16/2019, CT chest 07/18/2016 FINDINGS: The heart and mediastinal contours are unchanged. Chronic calcified hilar lymph nodes likely sequelae of prior granulomatous disease. No focal consolidation. No pulmonary edema. No pleural effusion. No pneumothorax. No acute osseous abnormality. Severe degenerative changes of bilateral shoulders, right greater than left. IMPRESSION: No active disease. Electronically Signed   By: Tish Frederickson M.D.   On: 07/10/2023 20:56      Assessment/Plan   Principal Problem:   DKA (diabetic ketoacidosis) (HCC) Active Problems:   Hyperlipidemia   GERD (gastroesophageal reflux disease)   Acute renal failure superimposed on stage 3b chronic kidney disease (HCC)   Essential hypertension   Pharyngitis   Lactic acidosis   SIRS (systemic inflammatory response syndrome) (HCC)   Elevated troponin     #) Diabetic ketoacidosis: In the setting of known history of  DM2, with previous episodes of DKA, and most recent A1c of 7.8% in  04/28/23, dx of dka on basis of the following laboratory eval: presenting blood sugar of 711 a/w CMP demonstrates anion gap metabolic acidosis with serum bicarbonate of 21 and AG of 19, and beta hydroxybutyric acid found to be elevated.  VBG result currently pending.  Additional presenting labs notable for corrected serum sodium of 138 and serum potassium of 4.2.  He notes good compliance with his outpatient diabetic regimen that includes Lantus 20 units SQ every morning 15 units SQ nightly, scheduled NovoLog 3 units 3 times daily with meals in addition to sliding scale NovoLog tid with meals in addition to use of Ozempic and dapagliflozin.  Precipitating factor not entirely clear at this time.  However, differential includes initial nausea/vomiting on the basis of a documented history of diabetic gastroparesis, potentially exacerbated by outpatient use of  Ozempic, leading to dehydration precipitating DKA.  There is also the possibility of underlying infection, with potential acute pharyngitis given the patient's report of sore throat, with physical exam evidence of mild pharyngeal erythema, without pharyngeal exudate. Although no other viral symptoms identified at this time. Group A strep PCR as well as COVID PCR currently pending.  No other source of underlying infection identified at this time, including chest x-ray, which showed no evidence of acute cardiopulmonary process, including evidence of infiltrate.  Urinalysis is pending.  In the setting of presenting nausea/vomiting as well as lightheadedness, differential would also include posterior stroke, however this possibility appears less likely relative to the above, particular given no evidence of objective acute focal neurologic deficits at this time.  ACS is felt to be less likely at this time in setting of ekg which was reported to show no e/o acute ischemic changes, while presentation is not associate with any chest pain.  There is very mild elevation in troponin, likely as a consequence of supply/demand mismatch in the setting of initial hypotension as well as diminished renal clearance from AKI superimposed on CKD 3B, but will continue to monitor troponin trend as well as monitor on telemetry.  In the ED, insulin drip was initiated, and the following IVF's administered: 2 L of LR followed by initiation of continuous LR at 125 cc/h.. In setting of most recent potassium level of 4.2, will transition IVF's to 1/2NS with 10 mEq/L Kcl @ 125 cc/hr, as further detailed below.    Plan: DKA protocol initiated. Insulin drip per End[facility] dka protocol. Q1H cbg's. Q4H BMP's in order to monitor ensuing AG, Na, and potassium.  IVF's: transition existing IVF's to 1/2NS with 10 mEq/L Kcl @ 125 cc/hr. Once [facility] cbg's reflect serum glucose less than 250, will add D5 to existing IVF's. Check serum Mg and Phos levels,  w/ prn supplementation per protocol. Once AG has closed and patient tolerating PO, will reinitiate sq basal insulin while continuing insulin drip for an additional two hours to prevent rebound hyperglycemia, before ultimately turning off the insulin drip. Until then, holding home insulin.  Hold home Ozempic and dapagliflozin during his hospitalization. Monitor on telemetry. Monitor strict I's & O's.  Prn IV Zofran.  As needed IV Ativan for nausea/vomiting refractory to Zofran.  As patient has a recent hemoglobin A1c result within the last 3 months, will refrain from rechecking at this time.  Follow-up result urinalysis.  Check urine drug screen.  Trend troponin.  Follow-up result of group B strep PCR, COVID-19 PCR, blood cultures x 2.  Add on procalcitonin level.  Follow-up result of VBG.                #)  Acute pharyngitis: Suspected in setting of patient's report of sore throat with physical exam revealing evidence of mild pharyngeal erythema, without associated exudate.  Differential includes reactive erythema as consequence of multiple episodes of nausea/vomiting. Infectious pharyngitis appears less likely at this time, although we will follow for group A strep PCR as well as COVID-19 PCR.  No evidence of uvula asymmetry to increase index of suspicion for peritonsillar abscess.  Consequently, will refrain from pursuit of CT soft tissue neck at this time.  Overall, will pursue conservative symptomatic management, will following for result group A strep and COVID-19 PCR, as further detailed below.  Plan: Follow-up results noted group A strep, and COVID-19 PCR..  Cepacol throat lozenges.  Prn Chloraseptic throat spray.  Repeat CBC in the morning.  Prn acetaminophen.                  #) Lactic acidosis: I-STAT lactic acid level initially elevated at 5.1, with repeat value trending down to 4.4.  Suspect that this is multifactorial in etiology, with predominant influence from  diabetic keto lactic acidosis as a consequence of presenting DKA,with additional contributions from clinical evidence of dehydration as well as acute kidney injury superimposed on CKD 3B.  No evidence of seizure-like activity.  Posterior stroke felt to be less likely, as above, but will check serial neurochecks.    Plan: IV fluids, as above.  Further evaluation management of presenting DKA, as above.  Repeat lactic acid level.  Follow-up result of the urinalysis, procalcitonin level, beta strep PCR, COVID PCR.  Check salicylate level.                #) SIRS criteria present: Presentation associated with mild leukocytosis, tachycardia, tachypnea.  However, in the absence of e/o overt underlying infection at this time, including,  CXR that showed no evidence of acute process, including evidence of infiltrate, criteria for sepsis not currently met. Will continue to follow for results of urinalysis, COVID PCR and group A strep PCR.   suspect non-infectious factors contributing to these SIRS findings, including leukocytosis with contributions from hemoconcentration in the setting of dehydration as well as reactive element as consequence of recent nausea/vomiting, with compensatory tachycardia as a result of dehydration and compensatory tachypnea as respiratory compensation for presenting metabolic acidosis. patient appears hemodynamically stable at this time following initial IV fluids.  Consequently, will refrain from initiation of IV antibiotics at this time.   Plan: Repeat CBC with diff in the AM.  Monitor strict I's and O's and daily weights.  Monitor on telemetry. Refraining from IV abx for now, as above.  Will follow for results of the PCR,'s group A strep PCR.  Add on procalcitonin level.  Follow for result of urinalysis.                  #) Acute Kidney Injury superimposed on CKD 3B: presenting creatinine 3.14 compared to baseline range of 1.5-2.0. Appears to be prerenal in  nature stemming from dehydration as a consequence of hyperglyemic-associated auto-diuresis, as above in addition to increased GI losses in the form of recent nausea/vomiting with concomitant decline in oral intake over that timeframe. UA with microscopy has been ordered, Therazole currently pending..   Plan: monitor strict I's & O's and daily weights. Attempt to avoid nephrotoxic agents.  Hold him gabapentin for now.  IVF's, as above.  Follow for result urinalysis.  Check random urine sodium as well as random urine creatinine.  BNP trending, as above.  Check serum magnesium level.                #) Elevated troponin: mildly elevated initial troponin of 25, which is down from most recent prior value of 40 in December 2023. Suspect that this mildly elevated troponin is on the basis of supply demand mismatch in the setting of initial global hypoperfusion as a consequence of initially noted hypotension as well as physiologic stressors stemming from presenting DKA as opposed to representing a type I process due to acute plaque rupture. Additionally, relative decline in renal clearance of troponin in the setting of AKI is likely also contributing to presenting troponin elevation. EKG reportedly shows no evidence of acute ischemic changes, including no evidence of STEMI, and CXR showed no acute CP process, including no evidence of pneumothorax.  Additionally, presentation is not associated with any CP.  Overall, ACS is felt to be less likely relative to type 2 supply demand mismatch, as above, but will closely monitor on telemetry overnight while treating suspected underlying DKA, as further described above. Presentation clinically less suggestive of acute PE .   Plan: repeat troponin currently pending.  Monitor on telemetry. PRN EKG for development of chest pain. check serum Mg level.  Prn BMP, as above.  Further evaluation management of DKA, as above. repeat CBC in the AM.                      #) Essential Hypertension: documented h/o such, with outpatient antihypertensive regimen including Imdur, metoprolol succinate.  SBP's in the ED today: Initially in the 80s, Sosan improving into the 120s 140s mmHg following IV fluids.   Plan: Close monitoring of subsequent BP via routine VS. in the setting of initial hypotension, will hold outpatient antihypertensive medications for now.                   #) Hyperlipidemia: documented h/o such. On high intensity atorvastatin as outpatient.   Plan: continue home statin.                   #) GERD: documented h/o such; on Pepcid as well as omeprazole as outpatient.   Plan: continue home Pepcid, with first dose to occur tomorrow.  In the setting of presenting nausea/vomiting, will transiently convert home oral omeprazole to IV Protonix, first dose now.     DVT prophylaxis: SCD's   Code Status: Full code Family Communication: none Disposition Plan: Per Rounding Team Consults called: none;  Admission status: obs     I SPENT GREATER THAN 75  MINUTES IN CLINICAL CARE TIME/MEDICAL DECISION-MAKING IN COMPLETING THIS ADMISSION.      Chaney Born Vernal Rutan DO [facility]  From 7PM - 7AM   07/10/2023, 11:00 PM

## 2023-07-10 NOTE — ED Provider Notes (Signed)
[facility] AT [facility] Provider Note   CSN: [id number] Arrival date & time: 07/10/23  1955     History  Chief Complaint  Patient presents with   Weakness    Joshua [name] is a 67 y.o. male.  HPI 67 year old male with a history of CHF, COPD, CAD, diabetes, gastroparesis, MI, and multiple other comorbidities presents with vomiting and weakness.  He has been feeling weak, lightheaded/dizzy and vomiting for about a week.  He had a sore throat for about 5 days.  Started on the left side and now is on the right side.  He has been constipated.  He denies any abdominal pain or fever.  The vomiting and trouble swallowing is a recurrent issue from esophagitis.  He also has a chronic foot wound that seems to be about baseline.  Mild cough, primarily when he is about to vomit.  Home Medications Prior to Admission medications   Medication Sig Start Date End Date Taking? Authorizing Provider  allopurinol (ZYLOPRIM) 100 MG tablet Take 1 tablet (100 mg total) by mouth daily. 11/17/22   Glade Lloyd, MD  ascorbic acid (VITAMIN C) 500 MG tablet Take 1 tablet (500 mg total) by mouth 2 (two) times daily. 11/16/22   Glade Lloyd, MD  aspirin EC 81 MG tablet Take 81 mg by mouth daily.     [provider]  atorvastatin (LIPITOR) 80 MG tablet Take 80 mg by mouth daily. 08/29/22   [provider]  Continuous Blood Gluc Receiver (FREESTYLE LIBRE 2 READER) DEVI See admin instructions. 11/23/22   [provider]  Continuous Blood Gluc Sensor (FREESTYLE LIBRE 2 SENSOR) MISC APPLY TOPICALLY EVERY 14 DAYS 11/01/22   Carlus Pavlov, MD  dapagliflozin propanediol (FARXIGA) 10 MG TABS tablet Take 1 tablet (10 mg total) by mouth daily before breakfast. 12/28/22   Carlus Pavlov, MD  DULoxetine (CYMBALTA) 20 MG capsule Take 20 mg by mouth 2 (two) times daily. 04/10/23   [provider]  escitalopram (LEXAPRO) 10 MG tablet Take 10 mg by mouth  daily. 04/20/23   [provider]  famotidine (PEPCID) 20 MG tablet Take 20 mg by mouth at bedtime. 11/27/20   [provider]  furosemide (LASIX) 40 MG tablet Take 1 tablet (40 mg total) by mouth 2 (two) times daily. 12/08/22 04/26/23  Zigmund Daniel., MD  gabapentin (NEURONTIN) 100 MG capsule Take 1 capsule by mouth at bedtime 04/28/23   Butch Penny, NP  insulin aspart (NOVOLOG) 100 UNIT/ML injection Inject 0-9 Units into the skin 3 (three) times daily with meals. CBG < 70: treat for low blood sugar Implement  CBG 70 - 120: 0 units  CBG 121 - 150: 1 unit  CBG 151 - 200: 2 units  CBG 201 - 250: 3 units  CBG 251 - 300: 5 units  CBG 301 - 350: 7 units  CBG 351 - 400: 9 units  CBG > 400: call MD 12/08/22   Zigmund Daniel., MD  insulin aspart (NOVOLOG) 100 UNIT/ML injection Inject 3 Units into the skin 3 (three) times daily with meals. Hold for NPO or consuming less than 50% of meals 12/09/22   Zigmund Daniel., MD  insulin glargine (LANTUS SOLOSTAR) 100 UNIT/ML Solostar Pen Inject 20 units under skin in am and 15 units at night 04/28/23   Carlus Pavlov, MD  Insulin Lispro (HUMALOG IJ) Inject as directed.    [provider]  isosorbide mononitrate (IMDUR) 60  MG 24 hr tablet Take 120 mg (two tablets) in the morning and 60 mg (one tablet) in the evening. 05/02/23   Lennette Bihari, MD  latanoprost (XALATAN) 0.005 % ophthalmic solution Place 1 drop into both eyes at bedtime.    [provider]  melatonin 3 MG TABS tablet Take 3 mg by mouth at bedtime.    [provider]  metoprolol succinate (TOPROL-XL) 25 MG 24 hr tablet Take 3 tablets (75 mg total) by mouth daily. 12/09/22 04/26/23  Zigmund Daniel., MD  nitroGLYCERIN (NITROSTAT) 0.4 MG SL tablet Place 1 tablet (0.4 mg total) under the tongue every 5 (five) minutes as needed for chest pain. Patient not taking: Reported on 05/02/2023 09/29/22   Lennette Bihari, MD  omeprazole  (PRILOSEC) 20 MG capsule Take 20 mg by mouth daily.    [provider]  oxyCODONE-acetaminophen (PERCOCET/ROXICET) 5-325 MG tablet Take 1 tablet by mouth every 6 (six) hours as needed. 12/31/22   [provider]  pantoprazole (PROTONIX) 40 MG tablet TAKE 1 TABLET BY MOUTH TWICE DAILY BEFORE MEAL(S) 09/19/19   Lennette Bihari, MD  polyethylene glycol powder (GLYCOLAX/MIRALAX) 17 GM/SCOOP powder Take 1 capful  (17 g) by mouth daily as needed for moderate constipation. 11/16/22   Glade Lloyd, MD  Semaglutide, 1 MG/DOSE, (OZEMPIC, 1 MG/DOSE,) 4 MG/3ML SOPN Inject 1 mg into the skin once a week. 12/28/22   Carlus Pavlov, MD  senna-docusate (SENOKOT-S) 8.6-50 MG tablet Take 1 tablet by mouth 2 (two) times daily. 11/16/22   Glade Lloyd, MD  tamsulosin (FLOMAX) 0.4 MG CAPS capsule Take 0.4 mg by mouth daily after supper. Prn for urinary tract symptoms    [provider]  ticagrelor (BRILINTA) 90 MG TABS tablet Take 1 tablet by mouth twice daily Patient taking differently: Take 90 mg by mouth 2 (two) times daily. 09/30/22   Lennette Bihari, MD  VITAMIN D, CHOLECALCIFEROL, PO Take by mouth.    [provider]      Allergies    Chlorthalidone    Review of Systems   Review of Systems  Constitutional:  Negative for fever.  HENT:  Positive for sore throat.   Respiratory:  Positive for cough. Negative for shortness of breath.   Cardiovascular:  Negative for chest pain.  Gastrointestinal:  Positive for constipation, nausea and vomiting. Negative for abdominal pain.  Musculoskeletal:  Negative for neck pain.  Neurological:  Positive for light-headedness.    Physical Exam Updated Vital Signs BP (!) 125/36   Pulse 82   Temp 98.4 F (36.9 C) (Oral)   Resp (!) 21   Ht 5\' 9"  (1.753 m)   Wt 115.7 kg   SpO2 100%   BMI 37.66 kg/m  Physical Exam Vitals and nursing note reviewed.  Constitutional:      General: He is not in acute distress.    Appearance: He is  well-developed. He is obese. He is not ill-appearing or diaphoretic.  HENT:     Head: Normocephalic and atraumatic.     Mouth/Throat:     Comments: No oropharyngeal swelling.  Mild posterior erythema.  Small exudate. Cardiovascular:     Rate and Rhythm: Normal rate and regular rhythm.     Heart sounds: Normal heart sounds.  Pulmonary:     Effort: Pulmonary effort is normal.     Breath sounds: Normal breath sounds.  Abdominal:     General: There is no distension.     Palpations: Abdomen is  soft.     Tenderness: There is no abdominal tenderness.  Musculoskeletal:     Comments: Left lateral foot has a chronic wound.  Superficial.  Skin:    General: Skin is warm and dry.  Neurological:     Mental Status: He is alert.     ED Results / Procedures / Treatments   Labs (all labs ordered are listed, but only abnormal results are displayed) Labs Reviewed  COMPREHENSIVE METABOLIC PANEL - Abnormal; Notable for the following components:      Result Value   Sodium 129 (*)    Chloride 89 (*)    CO2 21 (*)    Glucose, Bld 711 (*)    BUN 50 (*)    Creatinine, Ser 3.14 (*)    Total Protein 8.4 (*)    Albumin 3.2 (*)    Alkaline Phosphatase 221 (*)    GFR, Estimated 21 (*)    Anion gap 19 (*)    All other components within normal limits  CBC WITH DIFFERENTIAL/PLATELET - Abnormal; Notable for the following components:   WBC 13.7 (*)    RDW 17.1 (*)    Neutro Abs 11.9 (*)    Abs Immature Granulocytes 0.10 (*)    All other components within normal limits  LIPASE, BLOOD - Abnormal; Notable for the following components:   Lipase 65 (*)    All other components within normal limits  BETA-HYDROXYBUTYRIC ACID - Abnormal; Notable for the following components:   Beta-Hydroxybutyric Acid 0.72 (*)    All other components within normal limits  I-STAT CG4 LACTIC ACID, ED - Abnormal; Notable for the following components:   Lactic Acid, Venous 5.1 (*)    All other components within normal limits   TROPONIN I (HIGH SENSITIVITY) - Abnormal; Notable for the following components:   Troponin I (High Sensitivity) 25 (*)    All other components within normal limits  CULTURE, BLOOD (ROUTINE X 2)  CULTURE, BLOOD (ROUTINE X 2)  SARS CORONAVIRUS 2 BY RT PCR  GROUP A STREP BY PCR  PROTIME-INR  APTT  URINALYSIS, W/ REFLEX TO CULTURE (INFECTION SUSPECTED)  BASIC METABOLIC PANEL  BASIC METABOLIC PANEL  BASIC METABOLIC PANEL  BETA-HYDROXYBUTYRIC ACID  CBC WITH DIFFERENTIAL/PLATELET  MAGNESIUM  MAGNESIUM  PHOSPHORUS  I-STAT VENOUS BLOOD GAS, ED  CBG MONITORING, ED  I-STAT CG4 LACTIC ACID, ED  TROPONIN I (HIGH SENSITIVITY)    EKG None  Radiology DG Chest Port 1 View  Result Date: 07/10/2023 CLINICAL DATA:  Questionable sepsis - evaluate for abnormality EXAM: PORTABLE CHEST 1 VIEW COMPARISON:  Chest x-ray 08/16/2019, CT chest 07/18/2016 FINDINGS: The heart and mediastinal contours are unchanged. Chronic calcified hilar lymph nodes likely sequelae of prior granulomatous disease. No focal consolidation. No pulmonary edema. No pleural effusion. No pneumothorax. No acute osseous abnormality. Severe degenerative changes of bilateral shoulders, right greater than left. IMPRESSION: No active disease. Electronically Signed   By: Tish Frederickson M.D.   On: 07/10/2023 20:56    Procedures .Critical Care  Performed by: Pricilla Loveless, MD Authorized by: Pricilla Loveless, MD   Critical care provider statement:    Critical care time (minutes):  35   Critical care time was exclusive of:  Separately billable procedures and treating other patients   Critical care was necessary to treat or prevent imminent or life-threatening deterioration of the following conditions:  Renal failure and endocrine crisis   Critical care was time spent personally by me on the following activities:  Development of treatment  plan with patient or surrogate, discussions with consultants, evaluation of patient's response to  treatment, examination of patient, ordering and review of laboratory studies, ordering and review of radiographic studies, ordering and performing treatments and interventions, pulse oximetry, re-evaluation of patient's condition and review of old charts     Medications Ordered in ED Medications  lactated ringers bolus 1,000 mL (1,000 mLs Intravenous New Bag/Given 07/10/23 2232)  metoCLOPramide (REGLAN) injection 10 mg (has no administration in time range)  insulin regular, human (MYXREDLIN) 100 units/ 100 mL infusion (has no administration in time range)  dextrose 50 % solution 0-50 mL (has no administration in time range)  potassium chloride 10 mEq in 100 mL IVPB (has no administration in time range)  acetaminophen (TYLENOL) tablet 650 mg (has no administration in time range)    Or  acetaminophen (TYLENOL) suppository 650 mg (has no administration in time range)  melatonin tablet 3 mg (has no administration in time range)  ondansetron (ZOFRAN) injection 4 mg (has no administration in time range)  sodium chloride 0.45 % 1,000 mL with potassium chloride 10 mEq infusion (has no administration in time range)  dextrose 5 % and 0.45 % NaCl with KCl 10 mEq/L infusion (has no administration in time range)  LORazepam (ATIVAN) injection 0.5 mg (has no administration in time range)  ondansetron (ZOFRAN) injection 4 mg (4 mg Intravenous Given 07/10/23 2017)  lactated ringers bolus 1,000 mL (1,000 mLs Intravenous New Bag/Given 07/10/23 2234)    ED Course/ Medical Decision Making/ A&P                             Medical Decision Making Amount and/or Complexity of Data Reviewed Labs: ordered.    Details: Hyperglycemia with glucose over 700.  Mild acidosis and AKI. Radiology: ordered and independent interpretation performed.    Details: No pneumonia ECG/medicine tests: ordered.  Risk Prescription drug management. Decision regarding hospitalization.   Patient presents with vomiting and sore  throat.  Based on history and exam, low suspicion for deep space neck infection or epiglottitis.  Will send strep and COVID testing.  Mild erythema.  No fevers.  A lactate of been sent given he is hypotensive I think this is dehydration as this was a one-time value and he is no longer hypotensive.  He does have an acute kidney injury along with significant hyperglycemia up to 700.  Elevated beta hydroxybutyrate and mild anion gap acidosis.  Will put on insulin drip.  He has a leukocytosis on the lactate but the clinical picture is more consistent with DKA and dehydration rather than sepsis.  I do not find anything that requires antibiotics at this time.  Discussed with Dr. Arlean Hopping for admission.        Final Clinical Impression(s) / ED Diagnoses Final diagnoses:  Diabetic ketoacidosis without coma associated with type 2 diabetes mellitus (HCC)  Acute kidney injury [facility])    Rx / DC Orders ED Discharge Orders     None         Pricilla Loveless, MD 07/10/23 2306

## 2023-07-10 NOTE — ED Provider Triage Note (Signed)
Emergency Medicine Provider Triage Evaluation Note  Joshua [name] , a 67 y.o. male  was evaluated in triage.  Pt complains of presents emergency department with vomiting and hypotension.  He has an extensive past medical history including diabetes, gastroparesis, polyneuropathy, history of ulcer on the left foot, history of transmetatarsal amputation on the right foot.  Patient has had intermittent vomiting for the past week but persistent vomiting for the past 2 days unable to hold anything down.  History of CAD.[name] Currently experiencing homelessness.  He is living with his son on his couch. Review of Systems  Positive: N/v  Negative: cp  Physical Exam  BP (!) 85/61 (BP Location: Right Arm)   Pulse 85   Temp 98.4 F (36.9 C) (Oral)   Resp 20   Ht 5\' 9"  (1.753 m)   Wt 115.7 kg   SpO2 100%   BMI 37.66 kg/m  Gen:   Awake, no distress   Resp:  Normal effort  MSK:   Moves extremities without difficulty  Other:    Medical Decision Making  Medically screening exam initiated at 8:21 PM.  Appropriate orders placed.  Joshua [name] was informed that the remainder of the evaluation will be completed by another provider, this initial triage assessment does not replace that evaluation, and the importance of remaining in the ED until their evaluation is complete.     Arthor Captain, PA-C 07/10/23 2101

## 2023-07-11 ENCOUNTER — Other Ambulatory Visit: Payer: Self-pay

## 2023-07-11 DIAGNOSIS — Z5900 Homelessness unspecified: Secondary | ICD-10-CM | POA: Diagnosis not present

## 2023-07-11 DIAGNOSIS — E1122 Type 2 diabetes mellitus with diabetic chronic kidney disease: Secondary | ICD-10-CM | POA: Diagnosis present

## 2023-07-11 DIAGNOSIS — M109 Gout, unspecified: Secondary | ICD-10-CM | POA: Diagnosis present

## 2023-07-11 DIAGNOSIS — R6510 Systemic inflammatory response syndrome (SIRS) of non-infectious origin without acute organ dysfunction: Secondary | ICD-10-CM | POA: Diagnosis present

## 2023-07-11 DIAGNOSIS — E1110 Type 2 diabetes mellitus with ketoacidosis without coma: Secondary | ICD-10-CM

## 2023-07-11 DIAGNOSIS — E785 Hyperlipidemia, unspecified: Secondary | ICD-10-CM | POA: Diagnosis present

## 2023-07-11 DIAGNOSIS — Z1152 Encounter for screening for COVID-19: Secondary | ICD-10-CM | POA: Diagnosis not present

## 2023-07-11 DIAGNOSIS — E860 Dehydration: Secondary | ICD-10-CM | POA: Diagnosis present

## 2023-07-11 DIAGNOSIS — Z79899 Other long term (current) drug therapy: Secondary | ICD-10-CM | POA: Diagnosis not present

## 2023-07-11 DIAGNOSIS — N179 Acute kidney failure, unspecified: Secondary | ICD-10-CM | POA: Diagnosis present

## 2023-07-11 DIAGNOSIS — E08621 Diabetes mellitus due to underlying condition with foot ulcer: Secondary | ICD-10-CM | POA: Diagnosis not present

## 2023-07-11 DIAGNOSIS — E1143 Type 2 diabetes mellitus with diabetic autonomic (poly)neuropathy: Secondary | ICD-10-CM | POA: Diagnosis present

## 2023-07-11 DIAGNOSIS — I255 Ischemic cardiomyopathy: Secondary | ICD-10-CM | POA: Diagnosis present

## 2023-07-11 DIAGNOSIS — N1832 Chronic kidney disease, stage 3b: Secondary | ICD-10-CM | POA: Diagnosis present

## 2023-07-11 DIAGNOSIS — N400 Enlarged prostate without lower urinary tract symptoms: Secondary | ICD-10-CM | POA: Diagnosis present

## 2023-07-11 DIAGNOSIS — F329 Major depressive disorder, single episode, unspecified: Secondary | ICD-10-CM | POA: Diagnosis present

## 2023-07-11 DIAGNOSIS — I5042 Chronic combined systolic (congestive) and diastolic (congestive) heart failure: Secondary | ICD-10-CM | POA: Diagnosis present

## 2023-07-11 DIAGNOSIS — E871 Hypo-osmolality and hyponatremia: Secondary | ICD-10-CM | POA: Diagnosis not present

## 2023-07-11 DIAGNOSIS — E1142 Type 2 diabetes mellitus with diabetic polyneuropathy: Secondary | ICD-10-CM | POA: Diagnosis present

## 2023-07-11 DIAGNOSIS — J449 Chronic obstructive pulmonary disease, unspecified: Secondary | ICD-10-CM | POA: Diagnosis present

## 2023-07-11 DIAGNOSIS — I130 Hypertensive heart and chronic kidney disease with heart failure and stage 1 through stage 4 chronic kidney disease, or unspecified chronic kidney disease: Secondary | ICD-10-CM | POA: Diagnosis present

## 2023-07-11 DIAGNOSIS — L97422 Non-pressure chronic ulcer of left heel and midfoot with fat layer exposed: Secondary | ICD-10-CM | POA: Diagnosis not present

## 2023-07-11 DIAGNOSIS — I2489 Other forms of acute ischemic heart disease: Secondary | ICD-10-CM | POA: Diagnosis present

## 2023-07-11 DIAGNOSIS — Z794 Long term (current) use of insulin: Secondary | ICD-10-CM | POA: Diagnosis not present

## 2023-07-11 DIAGNOSIS — Z89431 Acquired absence of right foot: Secondary | ICD-10-CM | POA: Diagnosis not present

## 2023-07-11 LAB — BASIC METABOLIC PANEL
Anion gap: 11 (ref 5–15)
Anion gap: 10 (ref 5–15)
BUN: 44 mg/dL — ABNORMAL HIGH (ref 8–23)
BUN: 41 mg/dL — ABNORMAL HIGH (ref 8–23)
CO2: 26 mmol/L (ref 22–32)
CO2: 25 mmol/L (ref 22–32)
Calcium: 8.9 mg/dL (ref 8.9–10.3)
Calcium: 8.9 mg/dL (ref 8.9–10.3)
Chloride: 99 mmol/L (ref 98–111)
Chloride: 104 mmol/L (ref 98–111)
Creatinine, Ser: 2.5 mg/dL — ABNORMAL HIGH (ref 0.61–1.24)
Creatinine, Ser: 2.43 mg/dL — ABNORMAL HIGH (ref 0.61–1.24)
GFR, Estimated: 28 mL/min — ABNORMAL LOW (ref >60)
GFR, Estimated: 29 mL/min — ABNORMAL LOW (ref >60)
Glucose, Bld: 235 mg/dL — ABNORMAL HIGH (ref 70–99)
Glucose, Bld: 174 mg/dL — ABNORMAL HIGH (ref 70–99)
Potassium: 3.4 mmol/L — ABNORMAL LOW (ref 3.5–5.1)
Potassium: 3.3 mmol/L — ABNORMAL LOW (ref 3.5–5.1)
Sodium: 136 mmol/L (ref 135–145)
Sodium: 139 mmol/L (ref 135–145)

## 2023-07-11 LAB — CBC WITH DIFFERENTIAL/PLATELET
Abs Immature Granulocytes: 0.13 10*3/uL — ABNORMAL HIGH (ref 0.00–0.07)
Abs Immature Granulocytes: 0.1 10*3/uL — ABNORMAL HIGH (ref 0.00–0.07)
Basophils Absolute: 0 10*3/uL (ref 0.0–0.1)
Basophils Absolute: 0 10*3/uL (ref 0.0–0.1)
Basophils Relative: 0 %
Basophils Relative: 0 %
Eosinophils Absolute: 0 10*3/uL (ref 0.0–0.5)
Eosinophils Absolute: 0.1 10*3/uL (ref 0.0–0.5)
Eosinophils Relative: 0 %
Eosinophils Relative: 0 %
HCT: 45.6 % (ref 39.0–52.0)
HCT: 42.2 % (ref 39.0–52.0)
Hemoglobin: 14.5 g/dL (ref 13.0–17.0)
Hemoglobin: 13.5 g/dL (ref 13.0–17.0)
Immature Granulocytes: 1 %
Immature Granulocytes: 1 %
Lymphocytes Relative: 11 %
Lymphocytes Relative: 14 %
Lymphs Abs: 1.7 10*3/uL (ref 0.7–4.0)
Lymphs Abs: 1.9 10*3/uL (ref 0.7–4.0)
MCH: 26.5 pg (ref 26.0–34.0)
MCH: 26 pg (ref 26.0–34.0)
MCHC: 31.8 g/dL (ref 30.0–36.0)
MCHC: 32 g/dL (ref 30.0–36.0)
MCV: 83.2 fL (ref 80.0–100.0)
MCV: 81.2 fL (ref 80.0–100.0)
Monocytes Absolute: 1.6 10*3/uL — ABNORMAL HIGH (ref 0.1–1.0)
Monocytes Absolute: 1.4 10*3/uL — ABNORMAL HIGH (ref 0.1–1.0)
Monocytes Relative: 10 %
Monocytes Relative: 10 %
Neutro Abs: 12.4 10*3/uL — ABNORMAL HIGH (ref 1.7–7.7)
Neutro Abs: 10.2 10*3/uL — ABNORMAL HIGH (ref 1.7–7.7)
Neutrophils Relative %: 78 %
Neutrophils Relative %: 75 %
Platelets: 218 10*3/uL (ref 150–400)
Platelets: 226 10*3/uL (ref 150–400)
RBC: 5.48 MIL/uL (ref 4.22–5.81)
RBC: 5.2 MIL/uL (ref 4.22–5.81)
RDW: 17.3 % — ABNORMAL HIGH (ref 11.5–15.5)
RDW: 17.4 % — ABNORMAL HIGH (ref 11.5–15.5)
WBC: 15.9 10*3/uL — ABNORMAL HIGH (ref 4.0–10.5)
WBC: 13.7 10*3/uL — ABNORMAL HIGH (ref 4.0–10.5)
nRBC: 0 % (ref 0.0–0.2)
nRBC: 0 % (ref 0.0–0.2)

## 2023-07-11 LAB — MAGNESIUM: Magnesium: 2.6 mg/dL — ABNORMAL HIGH (ref 1.7–2.4)

## 2023-07-11 LAB — CBG MONITORING, ED
Glucose-Capillary: 154 mg/dL — ABNORMAL HIGH (ref 70–99)
Glucose-Capillary: 190 mg/dL — ABNORMAL HIGH (ref 70–99)
Glucose-Capillary: 191 mg/dL — ABNORMAL HIGH (ref 70–99)
Glucose-Capillary: 206 mg/dL — ABNORMAL HIGH (ref 70–99)
Glucose-Capillary: 217 mg/dL — ABNORMAL HIGH (ref 70–99)
Glucose-Capillary: 223 mg/dL — ABNORMAL HIGH (ref 70–99)
Glucose-Capillary: 240 mg/dL — ABNORMAL HIGH (ref 70–99)
Glucose-Capillary: 258 mg/dL — ABNORMAL HIGH (ref 70–99)
Glucose-Capillary: 301 mg/dL — ABNORMAL HIGH (ref 70–99)
Glucose-Capillary: 399 mg/dL — ABNORMAL HIGH (ref 70–99)
Glucose-Capillary: 461 mg/dL — ABNORMAL HIGH (ref 70–99)
Glucose-Capillary: 465 mg/dL — ABNORMAL HIGH (ref 70–99)
Glucose-Capillary: >600 mg/dL (ref 70–99)
Glucose-Capillary: >600 mg/dL (ref 70–99)
Glucose-Capillary: >600 mg/dL (ref 70–99)

## 2023-07-11 LAB — BETA-HYDROXYBUTYRIC ACID
Beta-Hydroxybutyric Acid: 0.27 mmol/L (ref 0.05–0.27)
Beta-Hydroxybutyric Acid: 0.23 mmol/L (ref 0.05–0.27)

## 2023-07-11 LAB — PHOSPHORUS: Phosphorus: 3.3 mg/dL (ref 2.5–4.6)

## 2023-07-11 LAB — LACTIC ACID, PLASMA: Lactic Acid, Venous: 2.2 mmol/L (ref 0.5–1.9)

## 2023-07-11 LAB — GLUCOSE, CAPILLARY
Glucose-Capillary: 163 mg/dL — ABNORMAL HIGH (ref 70–99)
Glucose-Capillary: 348 mg/dL — ABNORMAL HIGH (ref 70–99)

## 2023-07-11 MED ORDER — LACTATED RINGERS IV BOLUS
1000.0000 mL | Freq: Once | INTRAVENOUS | Status: AC
  Administered 2023-07-11: 1000 mL via INTRAVENOUS

## 2023-07-11 MED ORDER — DEXTROSE IN LACTATED RINGERS 5 % IV SOLN: INTRAVENOUS | Status: DC

## 2023-07-11 MED ORDER — INSULIN GLARGINE-YFGN 100 UNIT/ML ~~LOC~~ SOLN
25.0000 [IU] | Freq: Every day | SUBCUTANEOUS | Status: DC
  Administered 2023-07-11 – 2023-07-12 (×2): 25 [IU] via SUBCUTANEOUS
  Filled 2023-07-11 (×2): qty 0.25

## 2023-07-11 MED ORDER — LACTATED RINGERS IV BOLUS
20.0000 mL/kg | Freq: Once | INTRAVENOUS | Status: AC
  Administered 2023-07-11: 2314 mL via INTRAVENOUS

## 2023-07-11 MED ORDER — LACTATED RINGERS IV SOLN: INTRAVENOUS | Status: DC

## 2023-07-11 MED ORDER — METOCLOPRAMIDE HCL 5 MG/ML IJ SOLN: 10.0000 mg | Freq: Four times a day (QID) | INTRAMUSCULAR | Status: DC | PRN

## 2023-07-11 MED ORDER — DEXTROSE 50 % IV SOLN: 0.0000 mL | INTRAVENOUS | Status: DC | PRN

## 2023-07-11 MED ORDER — INSULIN ASPART 100 UNIT/ML IJ SOLN
0.0000 [IU] | Freq: Three times a day (TID) | INTRAMUSCULAR | Status: DC
  Administered 2023-07-11: 3 [IU] via SUBCUTANEOUS
  Administered 2023-07-11: 5 [IU] via SUBCUTANEOUS
  Administered 2023-07-12: 8 [IU] via SUBCUTANEOUS
  Administered 2023-07-12: 3 [IU] via SUBCUTANEOUS

## 2023-07-11 MED ORDER — INSULIN ASPART 100 UNIT/ML IJ SOLN
0.0000 [IU] | Freq: Every day | INTRAMUSCULAR | Status: DC
  Administered 2023-07-11 – 2023-07-12 (×2): 4 [IU] via SUBCUTANEOUS

## 2023-07-11 MED ORDER — SODIUM CHLORIDE 0.9 % IV BOLUS
500.0000 mL | Freq: Once | INTRAVENOUS | Status: AC
  Administered 2023-07-11: 500 mL via INTRAVENOUS

## 2023-07-11 MED ORDER — INSULIN REGULAR(HUMAN) IN NACL 100-0.9 UT/100ML-% IV SOLN
INTRAVENOUS | Status: DC
  Administered 2023-07-11: 3.4 [IU]/h via INTRAVENOUS
  Filled 2023-07-11: qty 100

## 2023-07-11 NOTE — ED Notes (Signed)
Date and time results received: 07/11/23 0620 (use smartphrase ".now" to insert current time)  Test: lactic acid Critical Value: 3.8  Name of Provider Notified: crosley  Orders Received? Or Actions Taken?:

## 2023-07-11 NOTE — Inpatient Diabetes Management (Addendum)
Inpatient Diabetes Program Recommendations  AACE/ADA: New Consensus Statement on Inpatient Glycemic Control   Target Ranges:  Prepandial:   less than 140 mg/dL      Peak postprandial:   less than 180 mg/dL (1-2 hours)      Critically ill patients:  140 - 180 mg/dL    Latest Reference Range & Units 07/11/23 01:59 07/11/23 02:41 07/11/23 03:17 07/11/23 03:53 07/11/23 04:27 07/11/23 05:41 07/11/23 06:33 07/11/23 07:43  Glucose-Capillary 70 - 99 mg/dL >295 (HH) 284 (H) 132 (H) 399 (H) 301 (H) 258 (H) 206 (H) 223 (H)    Latest Reference Range & Units 07/10/23 20:18  CO2 22 - 32 mmol/L 21 (L)  Glucose 70 - 99 mg/dL 440 (HH)  BUN 8 - 23 mg/dL 50 (H)  Creatinine 1.02 - 1.24 mg/dL 7.25 (H)  Calcium 8.9 - 10.3 mg/dL 9.4  Anion gap 5 - 15  19 (H)  Lactic acid 0.5-1.9 5.1  Beta-Hydroxybutyric acid 0.05-0.27 0.72    Latest Reference Range & Units 12/28/22 09:27 04/28/23 14:29  Hemoglobin A1C 4.0 - 5.6 % 7.5 ! 7.8 !   Review of Glycemic Control  Diabetes history: DM2 Outpatient Diabetes medications: Farxiga 10 mg daily, Novolog 0-9 units TID with meals, Novolog 3 units TID with meals, Lantus 20 units QAM, Lantus 15  units QPM, Ozempic 1 mg Qweek Current orders for Inpatient glycemic control: IV insulin  Inpatient Diabetes Program Recommendations:    Insulin: At time of transition from IV to SQ insulin, please consider ordering Semglee 25 units Q24H, CBGs Q4H, Novolog 0-15 units Q4H, and if ordered diet Novolog 3 units TID with meals for meal coverage if patient eats at least 50% of meals.   NOTE: Per H&P, patient admitted with DKA with initial glucose of 711 mg/dl, CO2 21, AG 19, and beta-hydrox 0.72 and was started on IV insulin. Patient reported 1 to 2 days of intermittent nausea resulting in at least 3-4 episodes of nonbloody, nonbilious emesis and sore throat over last several days.   Patient sees Dr. Elvera Lennox (Endocrinologist) and was last seen on 04/28/23. Per office note on 04/28/23, A1C was  7.8% and patient was directed to continue Farxiga 10 mg daily, increase Lantus to 20 units QAM and 15 units QPM, start Ozempic 1 mg Qweek, stop Tradjenta once Ozempic started.  Addendum 07/11/23@14 :24-Spoke with patient at bedside in ED regarding DM. Patient reports that he sees Dr. Elvera Lennox for DM control and he reports that he is taking Farxiga 10 mg daily, Novolog 0-9 units TID with meals, Novolog 3 units TID with meals, Lantus 20 units QAM, Lantus 15  units QPM, and Tradjenta 5 mg daily for DM as an outpatient. Patient states that he has been taking his DM medications consistently even with sore throat, nausea, and vomiting he has been having over the past few days. Inquired about Ozempic and patient states he has not taken any Ozempic in a while (he thinks due to cost) so he has continued taking Tradjenta. Patient reports that he checks finger sticks for CBGs; he had been using a Libre but the sensor glucose was way off from the finger stick glucose so he decided to stick with the finger stick glucose. Patient reports that his glucose usually runs in the 100's and up to 200's at times. Discussed DKA and treatment with IV insulin and transition to SQ insulin. Patient states he is feeling better and his throat is not as sore as it has been. Patient reports that he  has plenty of DM medications and supplies. Patient verbalized understanding of information discussed and states he has no questions at this time.   Thanks, Orlando Penner, RN, MSN, CDCES Diabetes Coordinator Inpatient Diabetes Program [phone] (Team Pager from 8am to 5pm)

## 2023-07-11 NOTE — Hospital Course (Addendum)
Brief hospital course: PMH of type II DM, neuropathy, gastroparesis, HTN, HLD, CKD 3B, obesity presented to the hospital with complaints of nausea and vomiting.  Found to have DKA. Assessment and Plan: DKA. Type 2 diabetes mellitus, uncontrolled with hyperglycemia with long-term insulin use with CKD and neuropathy and gastroparesis Anion gap was 19 at the time of admission beta-hydroxybutyrate acid was also elevated with Treated with IV fluids. Also on IV insulin. Currently anion gap appears to have closed on 7/29 and patient was transitioned to basal bolus regimen insulin. Will monitor overnight. Most likely compliance issues.  Intractable nausea. Possible diabetic gastroparesis flareup. Reglan as needed. Continue Zofran as needed.  Cough. Not think the patient is suffering from severe pharyngitis. For now we will monitor.  Lactic acidosis Etiology not clear.  Likely dehydration. No evidence of sepsis. No evidence of bowel issues. Improving.  Will recheck tomorrow.  SIRS. No evidence of sepsis. Monitor.  AKI on CKD 3B. Baseline serum creatinine around 2.0. On admission serum creatinine 3.14. Now improving. Will continue with IV fluids.  Troponin elevation. In the setting of demand ischemia. Monitor.  No chest pain.  HTN. Blood pressure stable. Monitor.  HLD. Continuing statin.  GERD. Continue home regimen.  Obesity Body mass index is 37.66 kg/m.  Placing the pt at higher risk of poor outcomes.

## 2023-07-11 NOTE — TOC CM/SW Note (Addendum)
Transition of Care [facility]) - Inpatient Brief Assessment   Patient Details  Name: AREN SAUPE MRN: [id number] Date of Birth: 10-17-1956  Transition [facility]) CM/SW Contact:    Gordy Clement, RN Phone Number: 07/11/2023, 4:32 PM   Clinical Narrative:  Patient from home. Will need cab voucher or bus pass at dc.  No recs at this time OC will continue to follow patient for any additional discharge needs      Transition of Care Asessment: Insurance and Status: Insurance coverage has been reviewed [facility] Medicare) Patient has primary care physician: Yes (Turmel, Caleb P, PA) Home environment has been reviewed: From Home Prior level of function:: independent Prior/Current Home Services: No current home services Social Determinants of Health Reivew: SDOH reviewed needs interventions Readmission risk has been reviewed: Yes (21%) Transition of care needs: transition of care needs identified, TOC will continue to follow

## 2023-07-11 NOTE — Progress Notes (Signed)
[facility] Progress Note Patient: Joshua [name] ZOX:[id number] DOB: [birth year] DOA: 07/10/2023  DOS: the patient was seen and examined on 07/11/2023  Brief hospital course: PMH of type II DM, neuropathy, gastroparesis, HTN, HLD, CKD 3B, obesity presented to the hospital with complaints of nausea and vomiting.  Found to have DKA. Assessment and Plan: DKA. Type 2 diabetes mellitus, uncontrolled with hyperglycemia with long-term insulin use with CKD and neuropathy and gastroparesis Anion gap was 19 at the time of admission beta-hydroxybutyrate acid was also elevated with Treated with IV fluids. Also on IV insulin. Currently anion gap appears to have closed on 7/29 and patient was transitioned to basal bolus regimen insulin. Will monitor overnight. Most likely compliance issues.  Intractable nausea. Possible diabetic gastroparesis flareup. Reglan as needed. Continue Zofran as needed.  Cough. Not think the patient is suffering from severe pharyngitis. For now we will monitor.  Lactic acidosis Etiology not clear.  Likely dehydration. No evidence of sepsis. No evidence of bowel issues. Improving.  Will recheck tomorrow.  SIRS. No evidence of sepsis. Monitor.  AKI on CKD 3B. Baseline serum creatinine around 2.0. On admission serum creatinine 3.14. Now improving. Will continue with IV fluids.  Troponin elevation. In the setting of demand ischemia. Monitor.  No chest pain.  HTN. Blood pressure stable. Monitor.  HLD. Continuing statin.  GERD. Continue home regimen.  Obesity Body mass index is 37.66 kg/m.  Placing the pt at higher risk of poor outcomes.  Subjective: No nausea no vomiting at the time of my evaluation.  No abdominal pain.  Passing gas.  Physical Exam: General: in Mild distress, No Rash Cardiovascular: S1 and S2 Present, No Murmur Respiratory: Good respiratory effort, Bilateral Air entry present. No Crackles, No wheezes Abdomen: Bowel Sound  present, No tenderness Extremities: Bilateral trace edema Neuro: Alert and oriented x3, no new focal deficit  Data Reviewed: I have Reviewed nursing notes, Vitals, and Lab results. Since last encounter, pertinent lab results CBC and BMP   . I have ordered test including CBC and BMP  .   Disposition: Status is: Inpatient Remains inpatient appropriate because: IV fluids and stabilization of the hyperglycemia  SCDs Start: 07/10/23 2224   Family Communication: No one at bedside Level of care: Progressive   Vitals:   07/11/23 1010 07/11/23 1115 07/11/23 1429 07/11/23 1620  BP:  (!) 141/57 113/65 (!) 162/81  Pulse:  77 80 79  Resp:  18 15 16   Temp: (!) 97.3 F (36.3 C)  (!) 97.3 F (36.3 C) (!) 97.5 F (36.4 C)  TempSrc: Oral  Oral Oral  SpO2:  99% 98% 95%  Weight:      Height:         Author: Lynden Oxford, MD 07/11/2023 6:10 PM  Please look on [email or web address] to find out who is on call.

## 2023-07-11 NOTE — ED Notes (Signed)
Redrew light green due to lab reporting it was hemolyzed

## 2023-07-11 NOTE — ED Notes (Signed)
ED TO INPATIENT HANDOFF REPORT  ED Nurse Name and Phone #: Jasiri Hanawalt, RN [phone]  S Name/Age/Gender Joshua [name] 67 y.o. male Room/Bed: 002C/002C  Code Status   Code Status: Full Code  Home/SNF/Other Home Patient oriented to: self, place, time, and situation Is this baseline? Yes   Triage Complete: Triage complete  Chief Complaint DKA (diabetic ketoacidosis) (HCC) [E11.10]  Triage Note Pt reports feeling weak associated with vomiting that started yesterday, states he is unable to eat or drink anything. He reports sore throat x 4 days. He also reports having an open wound to left foot.   Allergies Allergies  Allergen Reactions   Chlorthalidone Other (See Comments)    Causes dehydration, kidneys shut down    Level of Care/Admitting Diagnosis ED Disposition     ED Disposition  Admit   Condition  --   Comment  Hospital Area: [facility] [[id number]]  Level of Care: Progressive [102]  Admit to Progressive based on following criteria: GI, ENDOCRINE disease patients with GI bleeding, acute liver failure or pancreatitis, stable with diabetic ketoacidosis or thyrotoxicosis (hypothyroid) state.  May admit patient to [facility] or [facility] if equivalent level of care is available:: No  Covid Evaluation: Asymptomatic - no recent exposure (last 10 days) testing not required  Diagnosis: DKA (diabetic ketoacidosis) [facility]) [409811]  Admitting Physician: Rolly Salter [[id number]]  Attending Physician: Rolly Salter [id number]  Certification:: I certify there are rare and unusual circumstances requiring inpatient admission  Estimated Length of Stay: 3          B Medical/Surgery History Past Medical History:  Diagnosis Date   Acute coronary syndrome (HCC)    Anemia    B12 deficiency    Chest pain    CHF (congestive heart failure) (HCC)    Chronic combined systolic and diastolic CHF (congestive heart failure) (HCC)    a. 07/2015 Echo: EF 45-50%, mod LVH, mid  apical/antsept AK, Gr 1 DD.   Chronic low back pain    CKD (chronic kidney disease), stage III (HCC)    Constipation    COPD (chronic obstructive pulmonary disease) (HCC)    Coronary artery disease    a. 2012 s/p MI/PCI: s/p DES to mid/dist RCA and overlapping DES to LAD;  b. 07/2015 Cath: LAD 10% ISR, D1 40(jailed), LCX 61m, OM2 30, OM3 30, RCA 23m ISR, 10d ISR; c. 07/2016 NSTEMI/PCI: LAD 85ost/p/m (3.5x20 Synergy DES overlapping prior stent), D1 40, LCX 54m, OM2 95 (2.75x16 Synergy DES), OM3 30, RCA 100m, 10d.   Depression    Diabetic gastroparesis (HCC)    Diabetic polyneuropathy (HCC)    DKA (diabetic ketoacidoses) 03/14/2018   GERD (gastroesophageal reflux disease)    Gout    Heart failure (HCC)    Heart murmur    History of MI (myocardial infarction)    Hyperlipemia    Hypertension    Hypertensive heart disease    Hypokalemia    Ischemic cardiomyopathy    a. 07/2015 Echo: EF 45-50%, mod LVH, mid-apicalanteroseptal AK, Gr1 DD.   Joint pain    Kidney problem    Morbid obesity (HCC)    OSA on CPAP    Osteoarthritis    Osteomyelitis (HCC)    a. 07/2016 MTP joint of R great toe s/p transmetatarsal amputation.   Other fatigue    Polyneuropathy in diabetes(357.2)    Pulmonary sarcoidosis (HCC)    "problems w/it years ago; no problems anymore" (07/03/2014)   Rectal bleeding  Shortness of breath    Shortness of breath on exertion    Sleep apnea    Stomach ulcer    Swallowing difficulty    Type II diabetes mellitus (HCC)    Past Surgical History:  Procedure Laterality Date   ABDOMINAL AORTOGRAM W/LOWER EXTREMITY N/A 11/29/2022   Procedure: ABDOMINAL AORTOGRAM W/LOWER EXTREMITY;  Surgeon: Maeola Harman, MD;  Location: [facility] INVASIVE CV LAB;  Service: Cardiovascular;  Laterality: N/A;   AMPUTATION Right 07/24/2016   Procedure: TRANSMETATARSAL FOOT AMPUTATION;  Surgeon: Nadara Mustard, MD;  Location: [facility] OR;  Service: Orthopedics;  Laterality: Right;   APPLICATION OF WOUND  VAC Left 11/30/2022   Procedure: APPLICATION OF WOUND VAC;  Surgeon: Edwin Cap, DPM;  Location: [facility] OR;  Service: Podiatry;  Laterality: Left;   BALLOON DILATION N/A 03/21/2018   Procedure: BALLOON DILATION;  Surgeon: Bernette Redbird, MD;  Location: [facility] ENDOSCOPY;  Service: Endoscopy;  Laterality: N/A;   BIOPSY  03/16/2022   Procedure: BIOPSY;  Surgeon: Charlott Rakes, MD;  Location: [facility] ENDOSCOPY;  Service: Endoscopy;;   BREAST LUMPECTOMY Right ~ 2000   "benign"   CARDIAC CATHETERIZATION N/A 07/30/2015   Procedure: Left Heart Cath and Coronary Angiography;  Surgeon: Kathleene Hazel, MD;  Location: [facility] INVASIVE CV LAB;  Service: Cardiovascular;  Laterality: N/A;   CARDIAC CATHETERIZATION N/A 07/19/2016   Procedure: Left Heart Cath and Coronary Angiography;  Surgeon: Lyn Records, MD;  Location: [facility] INVASIVE CV LAB;  Service: Cardiovascular;  Laterality: N/A;   CARDIAC CATHETERIZATION N/A 07/29/2016   Procedure: Coronary Stent Intervention;  Surgeon: Lyn Records, MD;  Location: [facility] INVASIVE CV LAB;  Service: Cardiovascular;  Laterality: N/A;   CARDIAC CATHETERIZATION N/A 07/29/2016   Procedure: Intravascular Ultrasound/IVUS;  Surgeon: Lyn Records, MD;  Location: [facility] INVASIVE CV LAB;  Service: Cardiovascular;  Laterality: N/A;   CATARACT EXTRACTION W/ INTRAOCULAR LENS  IMPLANT, BILATERAL Bilateral 2014-2015   COLONOSCOPY WITH PROPOFOL N/A 08/22/2017   Procedure: COLONOSCOPY WITH PROPOFOL;  Surgeon: Charlott Rakes, MD;  Location: [facility] ENDOSCOPY;  Service: Endoscopy;  Laterality: N/A;   COLONOSCOPY WITH PROPOFOL N/A 03/16/2022   Procedure: COLONOSCOPY WITH PROPOFOL;  Surgeon: Charlott Rakes, MD;  Location: [facility] ENDOSCOPY;  Service: Endoscopy;  Laterality: N/A;   CORONARY ANGIOPLASTY WITH STENT PLACEMENT  10/31/2011   30 % MID ATRIOVENTRICULAR GROOVE CX STENOSIS. 90 % MID RCA.PTA TO LAD/STENTING OF TANDEM 60-70%. LAD STENOSIS 99% REDUCED TO 0%.3.0 X PROMUS DES POSTDILATED TO 3.25MM.    CORONARY ANGIOPLASTY WITH STENT PLACEMENT  07/03/2014   "2"   CORONARY STENT INTERVENTION N/A 04/09/2019   Procedure: CORONARY STENT INTERVENTION;  Surgeon: Corky Crafts, MD;  Location: [facility] INVASIVE CV LAB;  Service: Cardiovascular;  Laterality: N/A;   CORONARY STENT INTERVENTION N/A 08/22/2019   Procedure: CORONARY STENT INTERVENTION;  Surgeon: Swaziland, Peter M, MD;  Location: [facility] INVASIVE CV LAB;  Service: Cardiovascular;  Laterality: N/A;   ESOPHAGOGASTRODUODENOSCOPY (EGD) WITH PROPOFOL N/A 03/21/2018   Procedure: ESOPHAGOGASTRODUODENOSCOPY (EGD) WITH PROPOFOL;  Surgeon: Bernette Redbird, MD;  Location: [facility] ENDOSCOPY;  Service: Endoscopy;  Laterality: N/A;   ESOPHAGOGASTRODUODENOSCOPY (EGD) WITH PROPOFOL N/A 03/16/2022   Procedure: ESOPHAGOGASTRODUODENOSCOPY (EGD) WITH PROPOFOL;  Surgeon: Charlott Rakes, MD;  Location: [facility] ENDOSCOPY;  Service: Endoscopy;  Laterality: N/A;   GRAFT APPLICATION Left 11/30/2022   Procedure: GRAFT APPLICATION;  Surgeon: Edwin Cap, DPM;  [facility] OR;  Service: Podiatry;  Laterality: Left;   I & D EXTREMITY Right 10/30/2013   Procedure: IRRIGATION  AND DEBRIDEMENT RIGHT SMALL FINGER POSSIBLE SECONDARY WOUND CLOSURE;  Surgeon: Marlowe Shores, MD;  Location: [facility] ORS;  Service: Orthopedics;  Laterality: Right;  Mina Marble is available after 4pm   I & D EXTREMITY Right 11/01/2013   Procedure:  IRRIGATION AND DEBRIDEMENT RIGHT  PROXIMAL PHALANGEAL LEVEL AMPUTATION;  Surgeon: Marlowe Shores, MD;  Location: [facility] ORS;  Service: Orthopedics;  Laterality: Right;   INCISION AND DRAINAGE Right 10/28/2013   Procedure: INCISION AND DRAINAGE Right small finger;  Surgeon: Marlowe Shores, MD;  Location: [facility] ORS;  Service: Orthopedics;  Laterality: Right;   INCISION AND DRAINAGE OF WOUND Left 11/07/2022   Procedure: IRRIGATION AND DEBRIDEMENT WOUND;  Surgeon: Pilar Plate, DPM;  Location: [facility] OR;  Service: Podiatry;  Laterality: Left;  Left foot ucler debridement  and bone biopsy   LEFT HEART CATH Left 11/03/2011   Procedure: LEFT HEART CATH;  Surgeon: Lennette Bihari, MD;  Location: [facility] CATH LAB;  Service: Cardiovascular;  Laterality: Left;   LEFT HEART CATH AND CORONARY ANGIOGRAPHY N/A 04/09/2019   Procedure: LEFT HEART CATH AND CORONARY ANGIOGRAPHY;  Surgeon: Corky Crafts, MD;  Location: [facility] INVASIVE CV LAB;  Service: Cardiovascular;  Laterality: N/A;   LEFT HEART CATHETERIZATION WITH CORONARY ANGIOGRAM N/A 07/03/2014   Procedure: LEFT HEART CATHETERIZATION WITH CORONARY ANGIOGRAM;  Surgeon: Lesleigh Noe, MD;  Location: [facility] CATH LAB;  Service: Cardiovascular;  Laterality: N/A;   PERCUTANEOUS CORONARY STENT INTERVENTION (PCI-S) Left 11/03/2011   Procedure: PERCUTANEOUS CORONARY STENT INTERVENTION (PCI-S);  Surgeon: Lennette Bihari, MD;  Location: [facility] CATH LAB;  Service: Cardiovascular;  Laterality: Left;   RIGHT/LEFT HEART CATH AND CORONARY ANGIOGRAPHY N/A 08/22/2019   Procedure: RIGHT/LEFT HEART CATH AND CORONARY ANGIOGRAPHY;  Surgeon: Swaziland, Peter M, MD;  Location: [facility] INVASIVE CV LAB;  Service: Cardiovascular;  Laterality: N/A;   TOE AMPUTATION Right ~ 2010   "2nd toe"   TOE AMPUTATION Right 2012   "3rd toe"   WOUND DEBRIDEMENT Left 11/30/2022   Procedure: DEBRIDEMENT WOUND;  Surgeon: Edwin Cap, DPM;  Location[facility] OR;  Service: Podiatry;  Laterality: Left;     A IV Location/Drains/Wounds Patient Lines/Drains/Airways Status     Active Line/Drains/Airways     Name Placement date Placement time Site Days   Peripheral IV 07/10/23 20 G Anterior;Right Antecubital 07/10/23  2016  Antecubital  1   Peripheral IV 07/11/23 Right;Medial Antecubital 07/11/23  0751  Antecubital  less than 1   PICC Single Lumen 12/03/22 Right Brachial 46 cm 0 cm 12/03/22  2030  Brachial  220   Negative Pressure Wound Therapy Foot Anterior;Lateral;Left 11/30/22  1659  --  223            Intake/Output Last 24 hours  Intake/Output Summary (Last 24 hours) at  07/11/2023 1425 Last data filed at 07/11/2023 1125 Gross per 24 hour  Intake 4962.67 ml  Output --  Net 4962.67 ml    Labs/Imaging Results for orders placed or performed during the hospital encounter of 07/10/23 (from the past 48 hour(s))  I-Stat Lactic Acid, ED     Status: Abnormal   Collection Time: 07/10/23  8:18 PM  Result Value Ref Range   Lactic Acid, Venous 5.1 (HH) 0.5 - 1.9 mmol/L   Comment NOTIFIED PHYSICIAN   Comprehensive metabolic panel     Status: Abnormal   Collection Time: 07/10/23  8:18 PM  Result Value Ref Range   Sodium 129 (L) 135 - 145 mmol/L   Potassium  4.2 3.5 - 5.1 mmol/L   Chloride 89 (L) 98 - 111 mmol/L   CO2 21 (L) 22 - 32 mmol/L   Glucose, Bld 711 (HH) 70 - 99 mg/dL    Comment: CRITICAL RESULT CALLED TO, READ BACK BY AND VERIFIED WITH EMILY SHAFER RN 07/10/23 2113 M KOROLESKI Glucose reference range applies only to samples taken after fasting for at least 8 hours.    BUN 50 (H) 8 - 23 mg/dL   Creatinine, Ser 5.40 (H) 0.61 - 1.24 mg/dL   Calcium 9.4 8.9 - 98.1 mg/dL   Total Protein 8.4 (H) 6.5 - 8.1 g/dL   Albumin 3.2 (L) 3.5 - 5.0 g/dL   AST 16 15 - 41 U/L   ALT 10 0 - 44 U/L   Alkaline Phosphatase 221 (H) 38 - 126 U/L   Total Bilirubin 0.6 0.3 - 1.2 mg/dL   GFR, Estimated 21 (L) >60 mL/min    Comment: (NOTE) Calculated using the CKD-EPI Creatinine Equation (2021)    Anion gap 19 (H) 5 - 15    Comment: Performed at [facility] Lab, [address], [place], [place] [address]  CBC with Differential     Status: Abnormal   Collection Time: 07/10/23  8:18 PM  Result Value Ref Range   WBC 13.7 (H) 4.0 - 10.5 K/uL   RBC 5.65 4.22 - 5.81 MIL/uL   Hemoglobin 15.0 13.0 - 17.0 g/dL   HCT 82.9 56.2 - 13.0 %   MCV 82.7 80.0 - 100.0 fL   MCH 26.5 26.0 - 34.0 pg   MCHC 32.1 30.0 - 36.0 g/dL   RDW 86.5 (H) 78.4 - 69.6 %   Platelets 248 150 - 400 K/uL   nRBC 0.0 0.0 - 0.2 %   Neutrophils Relative % 86 %   Neutro Abs 11.9 (H) 1.7 - 7.7 K/uL    Lymphocytes Relative 6 %   Lymphs Abs 0.8 0.7 - 4.0 K/uL   Monocytes Relative 7 %   Monocytes Absolute 0.9 0.1 - 1.0 K/uL   Eosinophils Relative 0 %   Eosinophils Absolute 0.0 0.0 - 0.5 K/uL   Basophils Relative 0 %   Basophils Absolute 0.0 0.0 - 0.1 K/uL   Immature Granulocytes 1 %   Abs Immature Granulocytes 0.10 (H) 0.00 - 0.07 K/uL    Comment: Performed at [facility] Lab, [address], [place], [place] [address]  Protime-INR     Status: None   Collection Time: 07/10/23  8:18 PM  Result Value Ref Range   Prothrombin Time 14.3 11.4 - 15.2 seconds   INR 1.1 0.8 - 1.2    Comment: (NOTE) INR goal varies based on device and disease states. Performed at [facility] Lab, [address] [place], [place] [address]   APTT     Status: None   Collection Time: 07/10/23  8:18 PM  Result Value Ref Range   aPTT 35 24 - 36 seconds    Comment: Performed at [facility] Lab, [address] [place], [place] [address]  Troponin I (High Sensitivity)     Status: Abnormal   Collection Time: 07/10/23  8:18 PM  Result Value Ref Range   Troponin I (High Sensitivity) 25 (H) <18 ng/L    Comment: (NOTE) Elevated high sensitivity troponin I (hsTnI) values and significant  changes across serial measurements may suggest ACS but many other  chronic and acute conditions are known to elevate hsTnI results.  Refer to the "  Links" section for chest pain algorithms and additional  guidance. Performed at [facility] Lab, [address], [place], [place] [address]   Lipase, blood     Status: Abnormal   Collection Time: 07/10/23  8:18 PM  Result Value Ref Range   Lipase 65 (H) 11 - 51 U/L    Comment: Performed at [facility] Lab, [address], [place], [place] [address]  Beta-hydroxybutyric acid     Status: Abnormal   Collection Time: 07/10/23  8:18 PM  Result Value Ref Range   Beta-Hydroxybutyric Acid 0.72 (H) 0.05 - 0.27 mmol/L    Comment: Performed at [facility] Lab, [address], [place], [place] [address]  Salicylate level     Status: Abnormal   Collection Time: 07/10/23  8:18 PM  Result Value Ref Range   Salicylate Lvl <7.0 (L) 7.0 - 30.0 mg/dL    Comment: Performed at [facility] Lab, [address] [place], [place] [address]  SARS Coronavirus 2 by RT PCR (hospital order, performed in [facility] hospital lab) *cepheid single result test* Throat     Status: None   Collection Time: 07/10/23 10:30 PM   Specimen: Throat; Nasal Swab  Result Value Ref Range   SARS Coronavirus 2 by RT PCR NEGATIVE NEGATIVE    Comment: Performed at [facility] Lab, [address] [place], [place] [address]  Group A Strep by PCR     Status: None   Collection Time: 07/10/23 10:30 PM   Specimen: Throat; Sterile Swab  Result Value Ref Range   Group A Strep by PCR NOT DETECTED NOT DETECTED    Comment: Performed at [facility] Lab, [address] [place], [place] [address]  Troponin I (High Sensitivity)     Status: Abnormal   Collection Time: 07/10/23 10:30 PM  Result Value Ref Range   Troponin I (High Sensitivity) 21 (H) <18 ng/L    Comment: (NOTE) Elevated high sensitivity troponin I (hsTnI) values and significant  changes across serial measurements may suggest ACS but many other  chronic and acute conditions are known to elevate hsTnI results.  Refer to the "Links" section for chest pain algorithms and additional  guidance. Performed at [facility] Lab, [address], [place], [place] [address]   Magnesium     Status: Abnormal   Collection Time: 07/10/23 10:30 PM  Result Value Ref Range   Magnesium 2.5 (H) 1.7 - 2.4 mg/dL    Comment: Performed at [facility] Lab, [address], [place], [place] [address]  I-Stat venous blood gas, ED     Status: Abnormal   Collection Time: 07/10/23 10:49 PM  Result Value Ref Range   pH, Ven 7.463 (H) 7.25 - 7.43   pCO2, Ven 33.0 (L) 44 - 60 mmHg   pO2, Ven 21 (LL) 32 - 45 mmHg   Bicarbonate 23.7 20.0 - 28.0 mmol/L    TCO2 25 22 - 32 mmol/L   O2 Saturation 39 %   Acid-Base Excess 1.0 0.0 - 2.0 mmol/L   Sodium 129 (L) 135 - 145 mmol/L   Potassium 4.2 3.5 - 5.1 mmol/L   Calcium, Ion 1.06 (L) 1.15 - 1.40 mmol/L   HCT 46.0 39.0 - 52.0 %   Hemoglobin 15.6 13.0 - 17.0 g/dL   Sample type VENOUS    Comment NOTIFIED PHYSICIAN   I-Stat Lactic Acid, ED     Status: Abnormal   Collection Time: 07/10/23 10:50  PM  Result Value Ref Range   Lactic Acid, Venous 4.3 (HH) 0.5 - 1.9 mmol/L   Comment NOTIFIED PHYSICIAN   CBG monitoring, ED     Status: Abnormal   Collection Time: 07/10/23 11:33 PM  Result Value Ref Range   Glucose-Capillary >600 (HH) 70 - 99 mg/dL    Comment: Glucose reference range applies only to samples taken after fasting for at least 8 hours.  Urinalysis, w/ Reflex to Culture (Infection Suspected) -Urine, Clean Catch     Status: Abnormal   Collection Time: 07/11/23 12:07 AM  Result Value Ref Range   Specimen Source URINE, CLEAN CATCH    Color, Urine STRAW (A) YELLOW   APPearance CLEAR CLEAR   Specific Gravity, Urine 1.014 1.005 - 1.030   pH 6.0 5.0 - 8.0   Glucose, UA >=500 (A) NEGATIVE mg/dL   Hgb urine dipstick SMALL (A) NEGATIVE   Bilirubin Urine NEGATIVE NEGATIVE   Ketones, ur 5 (A) NEGATIVE mg/dL   Protein, ur 161 (A) NEGATIVE mg/dL   Nitrite NEGATIVE NEGATIVE   Leukocytes,Ua NEGATIVE NEGATIVE   RBC / HPF 0-5 0 - 5 RBC/hpf   WBC, UA 0-5 0 - 5 WBC/hpf    Comment:        Reflex urine culture not performed if WBC <=10, OR if Squamous epithelial cells >5. If Squamous epithelial cells >5 suggest recollection.    Bacteria, UA RARE (A) NONE SEEN   Squamous Epithelial / HPF 0-5 0 - 5 /HPF   Mucus PRESENT     Comment: Performed at [facility] Lab, [address], [place], [place] [address]  Rapid urine drug screen (hospital performed)     Status: None   Collection Time: 07/11/23 12:07 AM  Result Value Ref Range   Opiates NONE DETECTED NONE DETECTED   Cocaine NONE DETECTED NONE  DETECTED   Benzodiazepines NONE DETECTED NONE DETECTED   Amphetamines NONE DETECTED NONE DETECTED   Tetrahydrocannabinol NONE DETECTED NONE DETECTED   Barbiturates NONE DETECTED NONE DETECTED    Comment: (NOTE) DRUG SCREEN FOR MEDICAL PURPOSES ONLY.  IF CONFIRMATION IS NEEDED FOR ANY PURPOSE, NOTIFY LAB WITHIN 5 DAYS.  LOWEST DETECTABLE LIMITS FOR URINE DRUG SCREEN Drug Class                     Cutoff (ng/mL) Amphetamine and metabolites    1000 Barbiturate and metabolites    200 Benzodiazepine                 200 Opiates and metabolites        300 Cocaine and metabolites        300 THC                            50 Performed at [facility] Lab, [address], [place], [place] [address]   Sodium, urine, random     Status: None   Collection Time: 07/11/23 12:07 AM  Result Value Ref Range   Sodium, Ur 12 mmol/L    Comment: Performed at [facility] Lab, [address], [place], [place] [address]  Creatinine, urine, random     Status: None   Collection Time: 07/11/23 12:07 AM  Result Value Ref Range   Creatinine, Urine 34 mg/dL    Comment: Performed at [facility] Lab, [address], [place], [place] [address]  CBG monitoring, ED     Status: Abnormal  Collection Time: 07/11/23 12:51 AM  Result Value Ref Range   Glucose-Capillary >600 (HH) 70 - 99 mg/dL    Comment: Glucose reference range applies only to samples taken after fasting for at least 8 hours.  CBG monitoring, ED     Status: Abnormal   Collection Time: 07/11/23  1:23 AM  Result Value Ref Range   Glucose-Capillary >600 (HH) 70 - 99 mg/dL    Comment: Glucose reference range applies only to samples taken after fasting for at least 8 hours.  CBG monitoring, ED     Status: Abnormal   Collection Time: 07/11/23  1:59 AM  Result Value Ref Range   Glucose-Capillary >600 (HH) 70 - 99 mg/dL    Comment: Glucose reference range applies only to samples taken after fasting for at least 8 hours.  CBG monitoring, ED      Status: Abnormal   Collection Time: 07/11/23  2:41 AM  Result Value Ref Range   Glucose-Capillary 465 (H) 70 - 99 mg/dL    Comment: Glucose reference range applies only to samples taken after fasting for at least 8 hours.  CBG monitoring, ED     Status: Abnormal   Collection Time: 07/11/23  3:17 AM  Result Value Ref Range   Glucose-Capillary 461 (H) 70 - 99 mg/dL    Comment: Glucose reference range applies only to samples taken after fasting for at least 8 hours.  CBG monitoring, ED     Status: Abnormal   Collection Time: 07/11/23  3:53 AM  Result Value Ref Range   Glucose-Capillary 399 (H) 70 - 99 mg/dL    Comment: Glucose reference range applies only to samples taken after fasting for at least 8 hours.  CBG monitoring, ED     Status: Abnormal   Collection Time: 07/11/23  4:27 AM  Result Value Ref Range   Glucose-Capillary 301 (H) 70 - 99 mg/dL    Comment: Glucose reference range applies only to samples taken after fasting for at least 8 hours.  Basic metabolic panel     Status: Abnormal   Collection Time: 07/11/23  5:09 AM  Result Value Ref Range   Sodium 135 135 - 145 mmol/L   Potassium 4.0 3.5 - 5.1 mmol/L   Chloride 99 98 - 111 mmol/L   CO2 20 (L) 22 - 32 mmol/L   Glucose, Bld 356 (H) 70 - 99 mg/dL    Comment: Glucose reference range applies only to samples taken after fasting for at least 8 hours.   BUN 47 (H) 8 - 23 mg/dL   Creatinine, Ser 6.21 (H) 0.61 - 1.24 mg/dL   Calcium 9.4 8.9 - 30.8 mg/dL   GFR, Estimated 26 (L) >60 mL/min    Comment: (NOTE) Calculated using the CKD-EPI Creatinine Equation (2021)    Anion gap 16 (H) 5 - 15    Comment: Performed at [facility] Lab, [address], [place], [place] [address]  Procalcitonin     Status: None   Collection Time: 07/11/23  5:09 AM  Result Value Ref Range   Procalcitonin 0.41 ng/mL    Comment:        Interpretation: PCT (Procalcitonin) <= 0.5 ng/mL: Systemic infection (sepsis) is not likely. Local bacterial  infection is possible. (NOTE)       Sepsis PCT Algorithm           Lower Respiratory Tract  Infection PCT Algorithm    ----------------------------     ----------------------------         PCT < 0.25 ng/mL                PCT < 0.10 ng/mL          Strongly encourage             Strongly discourage   discontinuation of antibiotics    initiation of antibiotics    ----------------------------     -----------------------------       PCT 0.25 - 0.50 ng/mL            PCT 0.10 - 0.25 ng/mL               OR       >80% decrease in PCT            Discourage initiation of                                            antibiotics      Encourage discontinuation           of antibiotics    ----------------------------     -----------------------------         PCT >= 0.50 ng/mL              PCT 0.26 - 0.50 ng/mL               AND        <80% decrease in PCT             Encourage initiation of                                             antibiotics       Encourage continuation           of antibiotics    ----------------------------     -----------------------------        PCT >= 0.50 ng/mL                  PCT > 0.50 ng/mL               AND         increase in PCT                  Strongly encourage                                      initiation of antibiotics    Strongly encourage escalation           of antibiotics                                     -----------------------------                                           PCT <= 0.25 ng/mL  OR                                        > 80% decrease in PCT                                      Discontinue / Do not initiate                                             antibiotics  Performed at [facility] Lab, [address], [place], [place] [address]   CBC with Differential/Platelet     Status: Abnormal   Collection Time: 07/11/23  5:09 AM  Result Value Ref  Range   WBC 15.9 (H) 4.0 - 10.5 K/uL   RBC 5.48 4.22 - 5.81 MIL/uL   Hemoglobin 14.5 13.0 - 17.0 g/dL   HCT 82.9 56.2 - 13.0 %   MCV 83.2 80.0 - 100.0 fL   MCH 26.5 26.0 - 34.0 pg   MCHC 31.8 30.0 - 36.0 g/dL   RDW 86.5 (H) 78.4 - 69.6 %   Platelets 218 150 - 400 K/uL   nRBC 0.0 0.0 - 0.2 %   Neutrophils Relative % 78 %   Neutro Abs 12.4 (H) 1.7 - 7.7 K/uL   Lymphocytes Relative 11 %   Lymphs Abs 1.7 0.7 - 4.0 K/uL   Monocytes Relative 10 %   Monocytes Absolute 1.6 (H) 0.1 - 1.0 K/uL   Eosinophils Relative 0 %   Eosinophils Absolute 0.0 0.0 - 0.5 K/uL   Basophils Relative 0 %   Basophils Absolute 0.0 0.0 - 0.1 K/uL   Immature Granulocytes 1 %   Abs Immature Granulocytes 0.13 (H) 0.00 - 0.07 K/uL    Comment: Performed at [facility], [address], [place], [place] [address]  Magnesium     Status: Abnormal   Collection Time: 07/11/23  5:09 AM  Result Value Ref Range   Magnesium 2.6 (H) 1.7 - 2.4 mg/dL    Comment: Performed at [facility], [address], [place], [place] [address]  Phosphorus     Status: None   Collection Time: 07/11/23  5:09 AM  Result Value Ref Range   Phosphorus 3.3 2.5 - 4.6 mg/dL    Comment: Performed at [facility], [address], [place], [place] [address]  Beta-hydroxybutyric acid     Status: None   Collection Time: 07/11/23  5:09 AM  Result Value Ref Range   Beta-Hydroxybutyric Acid 0.27 0.05 - 0.27 mmol/L    Comment: Performed at [facility], [address] [place], [place] [address]  Lactic acid, plasma     Status: Abnormal   Collection Time: 07/11/23  5:10 AM  Result Value Ref Range   Lactic Acid, Venous 3.8 (HH) 0.5 - 1.9 mmol/L    Comment: CRITICAL RESULT CALLED TO, READ BACK BY AND VERIFIED WITH Darryll Capers RN 07/11/23 6440 [place] Derry Performed at [facility], [address] [place], [place] [address]   CBG monitoring, ED     Status: Abnormal   Collection Time: 07/11/23  5:41 AM  Result Value Ref  Range  Glucose-Capillary 258 (H) 70 - 99 mg/dL    Comment: Glucose reference range applies only to samples taken after fasting for at least 8 hours.  CBG monitoring, ED     Status: Abnormal   Collection Time: 07/11/23  6:33 AM  Result Value Ref Range   Glucose-Capillary 206 (H) 70 - 99 mg/dL    Comment: Glucose reference range applies only to samples taken after fasting for at least 8 hours.  CBG monitoring, ED     Status: Abnormal   Collection Time: 07/11/23  7:43 AM  Result Value Ref Range   Glucose-Capillary 223 (H) 70 - 99 mg/dL    Comment: Glucose reference range applies only to samples taken after fasting for at least 8 hours.  CBG monitoring, ED     Status: Abnormal   Collection Time: 07/11/23  8:57 AM  Result Value Ref Range   Glucose-Capillary 240 (H) 70 - 99 mg/dL    Comment: Glucose reference range applies only to samples taken after fasting for at least 8 hours.  Beta-hydroxybutyric acid     Status: None   Collection Time: 07/11/23  9:03 AM  Result Value Ref Range   Beta-Hydroxybutyric Acid 0.23 0.05 - 0.27 mmol/L    Comment: Performed at [facility], [address] [place], [place] [address]  CBC with Differential/Platelet     Status: Abnormal   Collection Time: 07/11/23  9:03 AM  Result Value Ref Range   WBC 13.7 (H) 4.0 - 10.5 K/uL   RBC 5.20 4.22 - 5.81 MIL/uL   Hemoglobin 13.5 13.0 - 17.0 g/dL   HCT 16.6 06.3 - 01.6 %   MCV 81.2 80.0 - 100.0 fL   MCH 26.0 26.0 - 34.0 pg   MCHC 32.0 30.0 - 36.0 g/dL   RDW 01.0 (H) 93.2 - 35.5 %   Platelets 226 150 - 400 K/uL   nRBC 0.0 0.0 - 0.2 %   Neutrophils Relative % 75 %   Neutro Abs 10.2 (H) 1.7 - 7.7 K/uL   Lymphocytes Relative 14 %   Lymphs Abs 1.9 0.7 - 4.0 K/uL   Monocytes Relative 10 %   Monocytes Absolute 1.4 (H) 0.1 - 1.0 K/uL   Eosinophils Relative 0 %   Eosinophils Absolute 0.1 0.0 - 0.5 K/uL   Basophils Relative 0 %   Basophils Absolute 0.0 0.0 - 0.1 K/uL   Immature Granulocytes 1 %   Abs  Immature Granulocytes 0.10 (H) 0.00 - 0.07 K/uL    Comment: Performed at [facility], [address] [place], [place] [address]  Lactic acid, plasma     Status: Abnormal   Collection Time: 07/11/23  9:03 AM  Result Value Ref Range   Lactic Acid, Venous 2.2 (HH) 0.5 - 1.9 mmol/L    Comment: CRITICAL VALUE NOTED. VALUE IS CONSISTENT WITH PREVIOUSLY REPORTED/CALLED VALUE Performed at [facility], [address], [place], [place] [address]   CBG monitoring, ED     Status: Abnormal   Collection Time: 07/11/23  9:59 AM  Result Value Ref Range   Glucose-Capillary 191 (H) 70 - 99 mg/dL    Comment: Glucose reference range applies only to samples taken after fasting for at least 8 hours.  Basic metabolic panel     Status: Abnormal   Collection Time: 07/11/23 10:05 AM  Result Value Ref Range   Sodium 136 135 - 145 mmol/L   Potassium 3.4 (L) 3.5 - 5.1 mmol/L   Chloride  99 98 - 111 mmol/L   CO2 26 22 - 32 mmol/L   Glucose, Bld 235 (H) 70 - 99 mg/dL    Comment: Glucose reference range applies only to samples taken after fasting for at least 8 hours.   BUN 44 (H) 8 - 23 mg/dL   Creatinine, Ser 1.61 (H) 0.61 - 1.24 mg/dL   Calcium 8.9 8.9 - 09.6 mg/dL   GFR, Estimated 28 (L) >60 mL/min    Comment: (NOTE) Calculated using the CKD-EPI Creatinine Equation (2021)    Anion gap 11 5 - 15    Comment: Performed at [facility], [address] [place], [place] [address]  CBG monitoring, ED     Status: Abnormal   Collection Time: 07/11/23 11:14 AM  Result Value Ref Range   Glucose-Capillary 190 (H) 70 - 99 mg/dL    Comment: Glucose reference range applies only to samples taken after fasting for at least 8 hours.  CBG monitoring, ED     Status: Abnormal   Collection Time: 07/11/23 12:29 PM  Result Value Ref Range   Glucose-Capillary 217 (H) 70 - 99 mg/dL    Comment: Glucose reference range applies only to samples taken after fasting for at least 8 hours.   DG Chest Port 1  View  Result Date: 07/10/2023 CLINICAL DATA:  Questionable sepsis - evaluate for abnormality EXAM: PORTABLE CHEST 1 VIEW COMPARISON:  Chest x-ray 08/16/2019, CT chest 07/18/2016 FINDINGS: The heart and mediastinal contours are unchanged. Chronic calcified hilar lymph nodes likely sequelae of prior granulomatous disease. No focal consolidation. No pulmonary edema. No pleural effusion. No pneumothorax. No acute osseous abnormality. Severe degenerative changes of bilateral shoulders, right greater than left. IMPRESSION: No active disease. Electronically Signed   By: Tish Frederickson M.D.   On: 07/10/2023 20:56    Pending Labs Unresulted Labs (From admission, onward)     Start     Ordered   07/12/23 0500  Basic metabolic panel  Daily,   R      07/11/23 1111   07/11/23 0900  Basic metabolic panel  (Diabetes Ketoacidosis (DKA))  STAT Now then every 4 hours ,   R (with STAT occurrences)      07/11/23 0755   07/11/23 0900  Beta-hydroxybutyric acid  (Diabetes Ketoacidosis (DKA))  Now then every 8 hours,   R (with TIMED occurrences)      07/11/23 0755   07/10/23 2018  Blood Culture (routine x 2)  (Undifferentiated presentation (screening labs and basic nursing orders))  BLOOD CULTURE X 2,   STAT      07/10/23 2018            Vitals/Pain Today's Vitals   07/11/23 0915 07/11/23 1010 07/11/23 1115 07/11/23 1243  BP: 117/62  (!) 141/57   Pulse: 80  77   Resp: 14  18   Temp:  (!) 97.3 F (36.3 C)    TempSrc:  Oral    SpO2: 99%  99%   Weight:      Height:      PainSc:    0-No pain    Isolation Precautions No active isolations  Medications Medications  acetaminophen (TYLENOL) tablet 650 mg (has no administration in time range)    Or  acetaminophen (TYLENOL) suppository 650 mg (has no administration in time range)  melatonin tablet 3 mg (has no administration in time range)  ondansetron (ZOFRAN) injection 4 mg (4 mg Intravenous Given 07/10/23 2335)  LORazepam (ATIVAN) injection 0.5 mg  (  0.5 mg Intravenous Given 07/10/23 2347)  menthol-cetylpyridinium (CEPACOL) lozenge 3 mg (has no administration in time range)  phenol (CHLORASEPTIC) mouth spray 1 spray (has no administration in time range)  atorvastatin (LIPITOR) tablet 80 mg (80 mg Oral Given 07/11/23 0914)  aspirin EC tablet 81 mg (81 mg Oral Given 07/11/23 0914)  famotidine (PEPCID) tablet 20 mg (has no administration in time range)  latanoprost (XALATAN) 0.005 % ophthalmic solution 1 drop (1 drop Both Eyes Given 07/11/23 0029)  pantoprazole (PROTONIX) injection 40 mg (40 mg Intravenous Given 07/10/23 2336)  metoCLOPramide (REGLAN) injection 10 mg (has no administration in time range)  insulin regular, human (MYXREDLIN) 100 units/ 100 mL infusion (3 Units/hr Intravenous Rate/Dose Change 07/11/23 1115)  lactated ringers infusion (0 mLs Intravenous Stopped 07/11/23 0835)  dextrose 5 % in lactated ringers infusion ( Intravenous New Bag/Given 07/11/23 0800)  dextrose 50 % solution 0-50 mL (has no administration in time range)  lactated ringers infusion ( Intravenous New Bag/Given 07/11/23 1238)  insulin glargine-yfgn (SEMGLEE) injection 25 Units (25 Units Subcutaneous Given 07/11/23 1232)  insulin aspart (novoLOG) injection 0-15 Units (5 Units Subcutaneous Given 07/11/23 1235)  insulin aspart (novoLOG) injection 0-5 Units (has no administration in time range)  ondansetron (ZOFRAN) injection 4 mg (4 mg Intravenous Given 07/10/23 2017)  lactated ringers bolus 1,000 mL (0 mLs Intravenous Stopped 07/10/23 2302)  metoCLOPramide (REGLAN) injection 10 mg (10 mg Intravenous Given 07/10/23 2234)  lactated ringers bolus 1,000 mL (0 mLs Intravenous Stopped 07/10/23 2334)  potassium chloride 10 mEq in 100 mL IVPB (0 mEq Intravenous Stopped 07/11/23 0156)  lactated ringers bolus 1,000 mL (0 mLs Intravenous Stopped 07/11/23 0314)  sodium chloride 0.9 % bolus 500 mL (0 mLs Intravenous Stopped 07/11/23 0752)  lactated ringers bolus 2,314 mL (0 mLs  Intravenous Stopped 07/11/23 0835)    Mobility walks     Focused Assessments    R Recommendations: See Admitting Provider Note  Report given to:   Additional Notes: Patient will be coming off insulin drip in the next few minutes, other fluids will continue. Patient is A&O but tired and has been sleeping most of the morning. He denies any pain and needs at this time.

## 2023-07-12 DIAGNOSIS — E08621 Diabetes mellitus due to underlying condition with foot ulcer: Secondary | ICD-10-CM | POA: Diagnosis not present

## 2023-07-12 DIAGNOSIS — E1110 Type 2 diabetes mellitus with ketoacidosis without coma: Secondary | ICD-10-CM | POA: Diagnosis not present

## 2023-07-12 DIAGNOSIS — L97422 Non-pressure chronic ulcer of left heel and midfoot with fat layer exposed: Secondary | ICD-10-CM | POA: Diagnosis not present

## 2023-07-12 LAB — GLUCOSE, CAPILLARY
Glucose-Capillary: 328 mg/dL — ABNORMAL HIGH (ref 70–99)
Glucose-Capillary: 200 mg/dL — ABNORMAL HIGH (ref 70–99)
Glucose-Capillary: 285 mg/dL — ABNORMAL HIGH (ref 70–99)
Glucose-Capillary: 342 mg/dL — ABNORMAL HIGH (ref 70–99)
Glucose-Capillary: 338 mg/dL — ABNORMAL HIGH (ref 70–99)

## 2023-07-12 MED ORDER — INSULIN GLARGINE-YFGN 100 UNIT/ML ~~LOC~~ SOLN
25.0000 [IU] | Freq: Every day | SUBCUTANEOUS | Status: DC
  Filled 2023-07-12: qty 0.25

## 2023-07-12 MED ORDER — PANTOPRAZOLE SODIUM 40 MG PO TBEC
40.0000 mg | DELAYED_RELEASE_TABLET | Freq: Two times a day (BID) | ORAL | Status: DC
  Administered 2023-07-12 – 2023-07-13 (×3): 40 mg via ORAL
  Filled 2023-07-12 (×3): qty 1

## 2023-07-12 MED ORDER — TICAGRELOR 90 MG PO TABS
90.0000 mg | ORAL_TABLET | Freq: Two times a day (BID) | ORAL | Status: DC
  Administered 2023-07-12 – 2023-07-13 (×3): 90 mg via ORAL
  Filled 2023-07-12 (×3): qty 1

## 2023-07-12 MED ORDER — INSULIN GLARGINE-YFGN 100 UNIT/ML ~~LOC~~ SOLN
10.0000 [IU] | Freq: Every day | SUBCUTANEOUS | Status: DC
  Administered 2023-07-12: 10 [IU] via SUBCUTANEOUS
  Filled 2023-07-12: qty 0.1

## 2023-07-12 MED ORDER — INSULIN ASPART 100 UNIT/ML IJ SOLN
6.0000 [IU] | Freq: Once | INTRAMUSCULAR | Status: AC
  Administered 2023-07-12: 6 [IU] via SUBCUTANEOUS

## 2023-07-12 MED ORDER — DULOXETINE HCL 20 MG PO CPEP
20.0000 mg | ORAL_CAPSULE | Freq: Two times a day (BID) | ORAL | Status: DC
  Administered 2023-07-12 – 2023-07-13 (×3): 20 mg via ORAL
  Filled 2023-07-12 (×4): qty 1

## 2023-07-12 MED ORDER — DAPAGLIFLOZIN PROPANEDIOL 10 MG PO TABS
10.0000 mg | ORAL_TABLET | Freq: Every day | ORAL | Status: DC
  Administered 2023-07-13: 10 mg via ORAL
  Filled 2023-07-12 (×2): qty 1

## 2023-07-12 MED ORDER — MELATONIN 5 MG PO TABS
5.0000 mg | ORAL_TABLET | Freq: Once | ORAL | Status: AC
  Administered 2023-07-12: 5 mg via ORAL
  Filled 2023-07-12: qty 1

## 2023-07-12 MED ORDER — ALLOPURINOL 100 MG PO TABS
100.0000 mg | ORAL_TABLET | Freq: Every day | ORAL | Status: DC
  Administered 2023-07-12 – 2023-07-13 (×2): 100 mg via ORAL
  Filled 2023-07-12 (×2): qty 1

## 2023-07-12 MED ORDER — METOPROLOL SUCCINATE ER 50 MG PO TB24
75.0000 mg | ORAL_TABLET | Freq: Every day | ORAL | Status: DC
  Administered 2023-07-12 – 2023-07-13 (×2): 75 mg via ORAL
  Filled 2023-07-12 (×2): qty 1

## 2023-07-12 MED ORDER — INSULIN GLARGINE-YFGN 100 UNIT/ML ~~LOC~~ SOLN
10.0000 [IU] | Freq: Every day | SUBCUTANEOUS | Status: DC
Start: 2023-07-12 — End: 2023-07-12

## 2023-07-12 MED ORDER — INSULIN ASPART 100 UNIT/ML IJ SOLN: 0.0000 [IU] | Freq: Three times a day (TID) | INTRAMUSCULAR | Status: DC

## 2023-07-12 MED ORDER — ENOXAPARIN SODIUM 40 MG/0.4ML IJ SOSY
40.0000 mg | PREFILLED_SYRINGE | INTRAMUSCULAR | Status: DC
  Administered 2023-07-12: 40 mg via SUBCUTANEOUS
  Filled 2023-07-12 (×2): qty 0.4

## 2023-07-12 MED ORDER — GABAPENTIN 100 MG PO CAPS
100.0000 mg | ORAL_CAPSULE | Freq: Every day | ORAL | Status: DC
  Administered 2023-07-12: 100 mg via ORAL
  Filled 2023-07-12: qty 1

## 2023-07-12 MED ORDER — ESCITALOPRAM OXALATE 10 MG PO TABS: 10.0000 mg | ORAL_TABLET | Freq: Every day | ORAL | Status: DC

## 2023-07-12 MED ORDER — INSULIN ASPART 100 UNIT/ML IJ SOLN
0.0000 [IU] | Freq: Three times a day (TID) | INTRAMUSCULAR | Status: DC
  Administered 2023-07-12 – 2023-07-13 (×2): 15 [IU] via SUBCUTANEOUS
  Administered 2023-07-13: 4 [IU] via SUBCUTANEOUS

## 2023-07-12 NOTE — Progress Notes (Signed)
Pt complaining of pressurized headache and neck rigidity. Made MD, Crosley aware.

## 2023-07-12 NOTE — Progress Notes (Signed)
Overview: Joshua [name] is a 67 y.o. with PMH of T2DM with hx of DKA and c/b peripheral neuropathy, and diabetic gastroparesis, HTN, HLD, CKD3B admitted for DKA on hospital day 1   Subjective:  Overnight:NAEON  States doing well. No acute concerns endorsed. Notes improvement in his appetite and resolution of n/v.   Objective: Afebrile, Normotensive, sating well on RA.   Vital signs in last 24 hours: Vitals:   07/11/23 2349 07/12/23 0142 07/12/23 0500 07/12/23 0554  BP: 131/78 (!) 168/93  127/74  Pulse: 91 90  74  Resp: 15 18  18   Temp: 98.4 F (36.9 C) 97.8 F (36.6 C)  97.8 F (36.6 C)  TempSrc: Oral Oral  Oral  SpO2: 98% 99%  98%  Weight:   112.4 kg   Height:       Supplemental O2: Room Air Last BM Date : 07/10/23 SpO2: 98 % Filed Weights   07/10/23 2004 07/12/23 0500  Weight: 115.7 kg 112.4 kg    Intake/Output Summary (Last 24 hours) at 07/12/2023 0743 Last data filed at 07/12/2023 1610 Gross per 24 hour  Intake 2105.8 ml  Output 1100 ml  Net 1005.8 ml   Net IO Since Admission: 5,054.69 mL [07/12/23 0743]  Physical Exam General: NAD, laying in bed  HENT: MMM, NCAT Lungs:  CTAB Cardiovascular: NSR, good radial pulses  Abdomen: No TTP, normal bowel sounds MSK: Pt with right foot transmetatarsal amputation. Good strength in all extremities Skin: left foot with wound with broken skin, no drainage but slight TTP.  Neuro: alert and oriented x4 Psych: normal mood and normal affect  Diagnostics CBG 200 BMP with Na 134, K 3.8, Creatinine 2.45, AG 13 CBC yesterday with improving leukocytosis, BHB resolved.   Assessment/Plan: Principal Problem:   DKA (diabetic ketoacidosis) (HCC) Active Problems:   Hyperlipidemia   GERD (gastroesophageal reflux disease)   Acute renal failure superimposed on stage 3b chronic kidney disease (HCC)   Essential hypertension   Pharyngitis   Lactic acidosis   SIRS (systemic inflammatory response syndrome) (HCC)   Elevated  troponin  DKA:  Resolved. Transitioned to sub q insulin yesterday. A1c in 04/28/23 was 7.8. Suspect DKA was secondary to non-adherence to insulin and dietary indiscretion. On Gabapentin 100 mg daily. States last used GLP 1 once last year. Home regimen is insulin glargine 20 units am and 15 units pm, and tradjenta 5 mg every day. He was suppose to stop tradjenta and start ozempic. Will monitor CBG today. CBG this am at 200. Start farxiga 10 mg today and start 10 units glargine nightly.  Foot Wound: Was seen by podiatry. Given worsening in appearance and pain and high risk given uncontrolled diabetes and poor social situation, will consult podiatry.   Sore Throat: Improved. Suspect secondary to episodes of vomiting. Group A strep PCR negative. COVID negative.   AKI on CKDIIIB Resolving. Suspect secondary to dehydration from problem 1.  Chronic Problems CHF: Home regimen Farxiga 10 mg every day, Lasix 40 mg BID, and metoprolol 75 mg daily. Appears euvolemic on exam. Restart home meds.  HTN: Home regimen of metoprolol succinate 75 mg HLD c/b CAD: Continue lipitor 80 mg every day, and aspirin 81 mg every day and Brillinta 90 mg daily. On Imdur 60 mg TID. Restart Brillinta and Imdur here.  GERD: Pantoprazole 40 mg BID and Famotidine 20 mg prn. Will add famoditine prn. Will stop IV protonix here. Pt will need OP with GI given he had EGD last year showing  concern for eosinophilic esophagitis and continues to endorse intermittent symptoms of dysphagia.  BPH: Was on tamsulosin 0.4 mg every day. Will restart here.  Gout: On allopurinol 100 mg every day. Will restart here. MDD: On Cymbalta 20 mg BID and escitalopram 10 mg every day. Will restart here.  OSA: On CPAP nightly. Will order here. Unsure if he is complaint at home given current living situation.    Diet: HH/Carb IVF: None, VTE: Enoxaparin Code: Full PT/OT recs: None,  Prior to Admission Living Arrangement: Home Anticipated Discharge  Location: Home Barriers to Discharge: Medical Stability Dispo: Anticipated discharge in approximately 1 day(s).   Gwenevere Abbot, MD Eligha Bridegroom. [facility] Internal Medicine Residency, PGY-3  Pager: [phone] After 5 pm and on weekends: Please call the on-call pager

## 2023-07-12 NOTE — Plan of Care (Signed)
  Problem: Coping: Goal: Ability to adjust to condition or change in health will improve Outcome: Progressing   Problem: Health Behavior/Discharge Planning: Goal: Ability to identify and utilize available resources and services will improve Outcome: Progressing   Problem: Skin Integrity: Goal: Risk for impaired skin integrity will decrease Outcome: Progressing   Problem: Education: Goal: Knowledge of General Education information will improve Description: Including pain rating scale, medication(s)/side effects and non-pharmacologic comfort measures Outcome: Progressing   Problem: Activity: Goal: Risk for activity intolerance will decrease Outcome: Progressing   Problem: Pain Managment: Goal: General experience of comfort will improve Outcome: Progressing   Problem: Safety: Goal: Ability to remain free from injury will improve Outcome: Progressing

## 2023-07-12 NOTE — Consult Note (Signed)
WOC Nurse Consult Note: patient is followed by podiatry for foot ulcers; last seen 06/13/2023 and ordered Betadine dressings  Reason for Consult: L foot ulcer  Wound type: full thickness left lateral foot, likely diabetic ulcer  Pressure Injury POA: NA  Measurement: 2 cm x 1 cm x 0.2 cm  Wound bed: 100% dry black eschar  Drainage (amount, consistency, odor) dry  Periwound: heavily callused  Dressing procedure/placement/frequency: Paint L lateral foot ulcer with Betadine twice daily allow to dry. Once dry cover with dry gauze and silicone foam. May lift foam daily to paint with Betadine.  Change foam dressing q3 days and prn soiling.   Thank you,    Priscella Mann MSN, RN-BC, [employer] [phone]

## 2023-07-12 NOTE — Consult Note (Signed)
Subjective:  Patient ID: Joshua [name], male    DOB: 03/05/1956,  MRN: [id number]  Patient with past medical history of type 2 DM, neuropathy, gastroparesis, HTN, HLD, CKD seen at beside today for concern of worsening left foot wound. He was admitted to the hospital on 7/28 for DKA with nausea and vomiting. Relates feeling well today with minimal pain to the foot. Patient well known to podiatry and has healed this area in the past. Most recently seen for a small wound on the bottom of this same foot. .   Past Medical History:  Diagnosis Date   Acute coronary syndrome (HCC)    Anemia    B12 deficiency    Chest pain    CHF (congestive heart failure) (HCC)    Chronic combined systolic and diastolic CHF (congestive heart failure) (HCC)    a. 07/2015 Echo: EF 45-50%, mod LVH, mid apical/antsept AK, Gr 1 DD.   Chronic low back pain    CKD (chronic kidney disease), stage III (HCC)    Constipation    COPD (chronic obstructive pulmonary disease) (HCC)    Coronary artery disease    a. 2012 s/p MI/PCI: s/p DES to mid/dist RCA and overlapping DES to LAD;  b. 07/2015 Cath: LAD 10% ISR, D1 40(jailed), LCX 92m, OM2 30, OM3 30, RCA 71m ISR, 10d ISR; c. 07/2016 NSTEMI/PCI: LAD 85ost/p/m (3.5x20 Synergy DES overlapping prior stent), D1 40, LCX 72m, OM2 95 (2.75x16 Synergy DES), OM3 30, RCA 34m, 10d.   Depression    Diabetic gastroparesis (HCC)    Diabetic polyneuropathy (HCC)    DKA (diabetic ketoacidoses) 03/14/2018   GERD (gastroesophageal reflux disease)    Gout    Heart failure (HCC)    Heart murmur    History of MI (myocardial infarction)    Hyperlipemia    Hypertension    Hypertensive heart disease    Hypokalemia    Ischemic cardiomyopathy    a. 07/2015 Echo: EF 45-50%, mod LVH, mid-apicalanteroseptal AK, Gr1 DD.   Joint pain    Kidney problem    Morbid obesity (HCC)    OSA on CPAP    Osteoarthritis    Osteomyelitis (HCC)    a. 07/2016 MTP joint of R great toe s/p transmetatarsal  amputation.   Other fatigue    Polyneuropathy in diabetes(357.2)    Pulmonary sarcoidosis (HCC)    "problems w/it years ago; no problems anymore" (07/03/2014)   Rectal bleeding    Shortness of breath    Shortness of breath on exertion    Sleep apnea    Stomach ulcer    Swallowing difficulty    Type II diabetes mellitus (HCC)      Past Surgical History:  Procedure Laterality Date   ABDOMINAL AORTOGRAM W/LOWER EXTREMITY N/A 11/29/2022   Procedure: ABDOMINAL AORTOGRAM W/LOWER EXTREMITY;  Surgeon: Maeola Harman, MD;  Location: [facility] INVASIVE CV LAB;  Service: Cardiovascular;  Laterality: N/A;   AMPUTATION Right 07/24/2016   Procedure: TRANSMETATARSAL FOOT AMPUTATION;  Surgeon: Nadara Mustard, MD;  Location: [facility] OR;  Service: Orthopedics;  Laterality: Right;   APPLICATION OF WOUND VAC Left 11/30/2022   Procedure: APPLICATION OF WOUND VAC;  Surgeon: Edwin Cap, DPM;  Location: [facility] OR;  Service: Podiatry;  Laterality: Left;   BALLOON DILATION N/A 03/21/2018   Procedure: BALLOON DILATION;  Surgeon: Bernette Redbird, MD;  Location: [facility] ENDOSCOPY;  Service: Endoscopy;  Laterality: N/A;   BIOPSY  03/16/2022   Procedure: BIOPSY;  Surgeon: Charlott Rakes, MD;  Location: [facility] ENDOSCOPY;  Service: Endoscopy;;   BREAST LUMPECTOMY Right ~ 2000   "benign"   CARDIAC CATHETERIZATION N/A 07/30/2015   Procedure: Left Heart Cath and Coronary Angiography;  Surgeon: Kathleene Hazel, MD;  Location: [facility] INVASIVE CV LAB;  Service: Cardiovascular;  Laterality: N/A;   CARDIAC CATHETERIZATION N/A 07/19/2016   Procedure: Left Heart Cath and Coronary Angiography;  Surgeon: Lyn Records, MD;  Location: [facility] INVASIVE CV LAB;  Service: Cardiovascular;  Laterality: N/A;   CARDIAC CATHETERIZATION N/A 07/29/2016   Procedure: Coronary Stent Intervention;  Surgeon: Lyn Records, MD;  Location: [facility] INVASIVE CV LAB;  Service: Cardiovascular;  Laterality: N/A;   CARDIAC CATHETERIZATION N/A 07/29/2016   Procedure:  Intravascular Ultrasound/IVUS;  Surgeon: Lyn Records, MD;  Location: [facility] INVASIVE CV LAB;  Service: Cardiovascular;  Laterality: N/A;   CATARACT EXTRACTION W/ INTRAOCULAR LENS  IMPLANT, BILATERAL Bilateral 2014-2015   COLONOSCOPY WITH PROPOFOL N/A 08/22/2017   Procedure: COLONOSCOPY WITH PROPOFOL;  Surgeon: Charlott Rakes, MD;  Location: [facility] ENDOSCOPY;  Service: Endoscopy;  Laterality: N/A;   COLONOSCOPY WITH PROPOFOL N/A 03/16/2022   Procedure: COLONOSCOPY WITH PROPOFOL;  Surgeon: Charlott Rakes, MD;  Location: [facility] ENDOSCOPY;  Service: Endoscopy;  Laterality: N/A;   CORONARY ANGIOPLASTY WITH STENT PLACEMENT  10/31/2011   30 % MID ATRIOVENTRICULAR GROOVE CX STENOSIS. 90 % MID RCA.PTA TO LAD/STENTING OF TANDEM 60-70%. LAD STENOSIS 99% REDUCED TO 0%.3.0 X PROMUS DES POSTDILATED TO 3.25MM.   CORONARY ANGIOPLASTY WITH STENT PLACEMENT  07/03/2014   "2"   CORONARY STENT INTERVENTION N/A 04/09/2019   Procedure: CORONARY STENT INTERVENTION;  Surgeon: Corky Crafts, MD;  Location: [facility] INVASIVE CV LAB;  Service: Cardiovascular;  Laterality: N/A;   CORONARY STENT INTERVENTION N/A 08/22/2019   Procedure: CORONARY STENT INTERVENTION;  Surgeon: Swaziland, Peter M, MD;  Location: [facility] INVASIVE CV LAB;  Service: Cardiovascular;  Laterality: N/A;   ESOPHAGOGASTRODUODENOSCOPY (EGD) WITH PROPOFOL N/A 03/21/2018   Procedure: ESOPHAGOGASTRODUODENOSCOPY (EGD) WITH PROPOFOL;  Surgeon: Bernette Redbird, MD;  Location: [facility] ENDOSCOPY;  Service: Endoscopy;  Laterality: N/A;   ESOPHAGOGASTRODUODENOSCOPY (EGD) WITH PROPOFOL N/A 03/16/2022   Procedure: ESOPHAGOGASTRODUODENOSCOPY (EGD) WITH PROPOFOL;  Surgeon: Charlott Rakes, MD;  Location: [facility] ENDOSCOPY;  Service: Endoscopy;  Laterality: N/A;   GRAFT APPLICATION Left 11/30/2022   Procedure: GRAFT APPLICATION;  Surgeon: Edwin Cap, DPM;  Location: [facility] OR;  Service: Podiatry;  Laterality: Left;   I & D EXTREMITY Right 10/30/2013   Procedure: IRRIGATION AND DEBRIDEMENT  RIGHT SMALL FINGER POSSIBLE SECONDARY WOUND CLOSURE;  Surgeon: Marlowe Shores, MD;  Location: [facility] ORS;  Service: Orthopedics;  Laterality: Right;  Mina Marble is available after 4pm   I & D EXTREMITY Right 11/01/2013   Procedure:  IRRIGATION AND DEBRIDEMENT RIGHT  PROXIMAL PHALANGEAL LEVEL AMPUTATION;  Surgeon: Marlowe Shores, MD;  Location: [facility] ORS;  Service: Orthopedics;  Laterality: Right;   INCISION AND DRAINAGE Right 10/28/2013   Procedure: INCISION AND DRAINAGE Right small finger;  Surgeon: Marlowe Shores, MD;  Location: [facility] ORS;  Service: Orthopedics;  Laterality: Right;   INCISION AND DRAINAGE OF WOUND Left 11/07/2022   Procedure: IRRIGATION AND DEBRIDEMENT WOUND;  Surgeon: Pilar Plate, DPM;  Location: [facility] OR;  Service: Podiatry;  Laterality: Left;  Left foot ucler debridement and bone biopsy   LEFT HEART CATH Left 11/03/2011   Procedure: LEFT HEART CATH;  Surgeon: Lennette Bihari, MD;  Location: [facility] CATH LAB;  Service: Cardiovascular;  Laterality: Left;   LEFT HEART CATH AND  CORONARY ANGIOGRAPHY N/A 04/09/2019   Procedure: LEFT HEART CATH AND CORONARY ANGIOGRAPHY;  Surgeon: Corky Crafts, MD;  Location: [facility] INVASIVE CV LAB;  Service: Cardiovascular;  Laterality: N/A;   LEFT HEART CATHETERIZATION WITH CORONARY ANGIOGRAM N/A 07/03/2014   Procedure: LEFT HEART CATHETERIZATION WITH CORONARY ANGIOGRAM;  Surgeon: Lesleigh Noe, MD;  Location: [name] CATH LAB;  Service: Cardiovascular;  Laterality: N/A;   PERCUTANEOUS CORONARY STENT INTERVENTION (PCI-S) Left 11/03/2011   Procedure: PERCUTANEOUS CORONARY STENT INTERVENTION (PCI-S);  Surgeon: Lennette Bihari, MD;  Location: [facility] CATH LAB;  Service: Cardiovascular;  Laterality: Left;   RIGHT/LEFT HEART CATH AND CORONARY ANGIOGRAPHY N/A 08/22/2019   Procedure: RIGHT/LEFT HEART CATH AND CORONARY ANGIOGRAPHY;  Surgeon: Swaziland, Peter M, MD;  Location: [facility] INVASIVE CV LAB;  Service: Cardiovascular;  Laterality: N/A;   TOE AMPUTATION Right ~  2010   "2nd toe"   TOE AMPUTATION Right 2012   "3rd toe"   WOUND DEBRIDEMENT Left 11/30/2022   Procedure: DEBRIDEMENT WOUND;  Surgeon: Edwin Cap, DPM;  Location: [facility] OR;  Service: Podiatry;  Laterality: Left;       Latest Ref Rng & Units 07/11/2023    9:03 AM 07/11/2023    5:09 AM 07/10/2023   10:49 PM  CBC  WBC 4.0 - 10.5 K/uL 13.7  15.9    Hemoglobin 13.0 - 17.0 g/dL 64.4  03.4  74.2   Hematocrit 39.0 - 52.0 % 42.2  45.6  46.0   Platelets 150 - 400 K/uL 226  218         Latest Ref Rng & Units 07/12/2023    3:26 AM 07/11/2023    2:48 PM 07/11/2023   10:05 AM  BMP  Glucose 70 - 99 mg/dL 595  638  756   BUN 8 - 23 mg/dL 37  41  44   Creatinine 0.61 - 1.24 mg/dL 4.33  2.95  1.88   Sodium 135 - 145 mmol/L 134  139  136   Potassium 3.5 - 5.1 mmol/L 3.8  3.3  3.4   Chloride 98 - 111 mmol/L 100  104  99   CO2 22 - 32 mmol/L 21  25  26    Calcium 8.9 - 10.3 mg/dL 8.7  8.9  8.9      Objective:   Vitals:   07/12/23 0749 07/12/23 0800  BP:  (!) 145/87  Pulse:  74  Resp:  17  Temp: 97.9 F (36.6 C)   SpO2:  98%    General:AA&O x 3. Normal mood and affect   Vascular: DP and PT pulses 2/4 bilateral. Brisk capillary refill to all digits. Pedal hair present   Neruological. Epicritic sensation grossly intact.   Derm: Left latera foot  wound with hyperkeratotic and granular base . No erythema or edema present. No purulence. No probe to bone. Interspaces clears of maceration. Nails well groomed and normal in appearance  MSK: MMT 5/5 in dorsiflexion, plantar flexion, inversion and eversion. Normal joint ROM without pain or crepitus.    Procedure: Excisional Debridement of Wound Rationale: Removal of non-viable soft tissue from the wound to promote healing.  Anesthesia: none Pre-Debridement Wound Measurements: Overlying hyperkeratosis  Post-Debridement Wound Measurements: 0.5 cm x 0.2 cm x 0.2 cm  Type of Debridement: Sharp Excisional Tissue Removed: Non-viable soft  tissue Depth of Debridement: subcutaneous tissue. Technique: Sharp excisional debridement to bleeding, viable wound base.  Dressing: Dry, sterile, compression dressing. Disposition: Patient tolerated procedure well.    Assessment & Plan:  Patient was evaluated and treated and all questions answered.  DX: Diabetic foot wound left foot with fat layer exposed  Wound care: betadine, DSD. Seen by wound care and agree with recommendations  Antibiotics: None necessary from foot standpoint  DME: surgical shoe with heel weight bearing   Discussed with patient diagnosis and treatment options.  Discussed continued wound care and follow up after discharge from the hospital  Patient in agreement with plan and all questions answered.  Podiatry will follow in clinic will sign off at this time.   Louann Sjogren, DPM  Accessible via secure chat for questions or concerns.

## 2023-07-13 ENCOUNTER — Other Ambulatory Visit (HOSPITAL_COMMUNITY): Payer: Self-pay

## 2023-07-13 DIAGNOSIS — E1110 Type 2 diabetes mellitus with ketoacidosis without coma: Secondary | ICD-10-CM | POA: Diagnosis not present

## 2023-07-13 LAB — GLUCOSE, CAPILLARY
Glucose-Capillary: 180 mg/dL — ABNORMAL HIGH (ref 70–99)
Glucose-Capillary: 299 mg/dL — ABNORMAL HIGH (ref 70–99)
Glucose-Capillary: 312 mg/dL — ABNORMAL HIGH (ref 70–99)

## 2023-07-13 MED ORDER — ASPIRIN 81 MG PO TBEC
81.0000 mg | DELAYED_RELEASE_TABLET | Freq: Every day | ORAL | 0 refills | Status: AC
  Filled 2023-07-13: qty 30, 30d supply, fill #0

## 2023-07-13 MED ORDER — BLOOD GLUCOSE MONITOR SYSTEM W/DEVICE KIT
1.0000 | PACK | Freq: Three times a day (TID) | 0 refills | Status: DC
  Filled 2023-07-13: qty 1, 7d supply, fill #0

## 2023-07-13 MED ORDER — ALLOPURINOL 100 MG PO TABS
100.0000 mg | ORAL_TABLET | Freq: Every day | ORAL | 0 refills | Status: DC
  Filled 2023-07-13: qty 30, 30d supply, fill #0

## 2023-07-13 MED ORDER — LATANOPROST 0.005 % OP SOLN
1.0000 [drp] | Freq: Every day | OPHTHALMIC | 0 refills | Status: AC
  Filled 2023-07-13: qty 2.5, 25d supply, fill #0

## 2023-07-13 MED ORDER — DAPAGLIFLOZIN PROPANEDIOL 10 MG PO TABS
10.0000 mg | ORAL_TABLET | Freq: Every day | ORAL | 0 refills | Status: DC
  Filled 2023-07-13: qty 30, 30d supply, fill #0

## 2023-07-13 MED ORDER — FAMOTIDINE 20 MG PO TABS
20.0000 mg | ORAL_TABLET | Freq: Every day | ORAL | 0 refills | Status: DC
  Filled 2023-07-13: qty 30, 30d supply, fill #0

## 2023-07-13 MED ORDER — NITROGLYCERIN 0.4 MG SL SUBL
0.4000 mg | SUBLINGUAL_TABLET | SUBLINGUAL | 3 refills | Status: AC | PRN
  Filled 2023-07-13 – 2024-01-01 (×2): qty 25, 7d supply, fill #0
  Filled 2024-03-31: qty 25, 7d supply, fill #1

## 2023-07-13 MED ORDER — PANTOPRAZOLE SODIUM 40 MG PO TBEC
40.0000 mg | DELAYED_RELEASE_TABLET | Freq: Two times a day (BID) | ORAL | 0 refills | Status: DC
  Filled 2023-07-13: qty 60, 30d supply, fill #0

## 2023-07-13 MED ORDER — VITAMIN D 50 MCG (2000 UT) PO TABS
2000.0000 [IU] | ORAL_TABLET | Freq: Every day | ORAL | 0 refills | Status: DC
  Filled 2023-07-13: qty 30, 30d supply, fill #0

## 2023-07-13 MED ORDER — INSULIN PEN NEEDLE 32G X 4 MM MISC
0 refills | Status: AC
  Filled 2023-07-13: qty 100, 50d supply, fill #0

## 2023-07-13 MED ORDER — BLOOD GLUCOSE TEST VI STRP
1.0000 | ORAL_STRIP | Freq: Four times a day (QID) | 0 refills | Status: AC
  Filled 2023-07-13: qty 200, 50d supply, fill #0

## 2023-07-13 MED ORDER — METOPROLOL SUCCINATE ER 25 MG PO TB24: 75.0000 mg | ORAL_TABLET | Freq: Every day | ORAL | 0 refills | Status: DC

## 2023-07-13 MED ORDER — LANCET DEVICE MISC
1.0000 | Freq: Three times a day (TID) | 0 refills | Status: AC
  Filled 2023-07-13: qty 1, 30d supply, fill #0

## 2023-07-13 MED ORDER — ATORVASTATIN CALCIUM 80 MG PO TABS
80.0000 mg | ORAL_TABLET | Freq: Every day | ORAL | 0 refills | Status: DC
  Filled 2023-07-13: qty 30, 30d supply, fill #0

## 2023-07-13 MED ORDER — GABAPENTIN 100 MG PO CAPS
100.0000 mg | ORAL_CAPSULE | Freq: Every day | ORAL | 0 refills | Status: DC
  Filled 2023-07-13: qty 30, 30d supply, fill #0

## 2023-07-13 MED ORDER — ACCU-CHEK SOFTCLIX LANCETS MISC
1.0000 | Freq: Three times a day (TID) | 0 refills | Status: AC
  Filled 2023-07-13: qty 100, 30d supply, fill #0
  Filled 2023-07-13: qty 100, 34d supply, fill #0

## 2023-07-13 MED ORDER — FUROSEMIDE 40 MG PO TABS
40.0000 mg | ORAL_TABLET | Freq: Two times a day (BID) | ORAL | 0 refills | Status: DC
  Filled 2023-07-13: qty 60, 30d supply, fill #0

## 2023-07-13 MED ORDER — INSULIN GLARGINE-YFGN 100 UNIT/ML ~~LOC~~ SOLN
15.0000 [IU] | Freq: Every day | SUBCUTANEOUS | Status: DC
  Filled 2023-07-13: qty 0.15

## 2023-07-13 MED ORDER — TICAGRELOR 90 MG PO TABS
90.0000 mg | ORAL_TABLET | Freq: Two times a day (BID) | ORAL | 3 refills | Status: DC
  Filled 2023-07-13: qty 180, 90d supply, fill #0

## 2023-07-13 MED ORDER — ISOSORBIDE MONONITRATE ER 60 MG PO TB24
ORAL_TABLET | ORAL | 3 refills | Status: DC
  Filled 2023-07-13: qty 270, 90d supply, fill #0

## 2023-07-13 MED ORDER — INSULIN ASPART 100 UNIT/ML IJ SOLN
3.0000 [IU] | Freq: Three times a day (TID) | INTRAMUSCULAR | Status: DC
  Administered 2023-07-13: 3 [IU] via SUBCUTANEOUS

## 2023-07-13 MED ORDER — INSULIN GLARGINE-YFGN 100 UNIT/ML ~~LOC~~ SOLN
30.0000 [IU] | Freq: Every day | SUBCUTANEOUS | Status: DC
  Administered 2023-07-13: 30 [IU] via SUBCUTANEOUS
  Filled 2023-07-13 (×2): qty 0.3

## 2023-07-13 MED ORDER — POVIDONE-IODINE 10 % EX SWAB
1.0000 | CUTANEOUS | 0 refills | Status: AC
Start: 2023-07-13 — End: 2023-08-12
  Filled 2023-07-13: qty 10, 30d supply, fill #0

## 2023-07-13 MED ORDER — DULOXETINE HCL 20 MG PO CPEP
20.0000 mg | ORAL_CAPSULE | Freq: Two times a day (BID) | ORAL | 0 refills | Status: AC
Start: 2023-07-13 — End: 2024-05-23
  Filled 2023-07-13: qty 60, 30d supply, fill #0

## 2023-07-13 MED ORDER — LANTUS SOLOSTAR 100 UNIT/ML ~~LOC~~ SOPN
PEN_INJECTOR | SUBCUTANEOUS | 0 refills | Status: DC
  Filled 2023-07-13: qty 15, 33d supply, fill #0

## 2023-07-13 MED ORDER — ACETAMINOPHEN 325 MG PO TABS: 650.0000 mg | ORAL_TABLET | Freq: Four times a day (QID) | ORAL | Status: DC | PRN

## 2023-07-13 NOTE — TOC Transition Note (Signed)
Transition of Care [facility]) - CM/SW Discharge Note   Patient Details  Name: Joshua [name] MRN: [id number] Date of Birth: 1956-03-02  [facility] CM/SW Contact:  Gordy Clement, RN Phone Number: 07/13/2023, 1:41 PM   Clinical Narrative:     Patient to dc today  Family picking up later this afternoon.  No TOC needs identified           Patient Goals and CMS Choice      Discharge Placement                         Discharge Plan and Services Additional resources added to the After Visit Summary for                                       Social Determinants of Health (SDOH) Interventions SDOH Screenings   Food Insecurity: No Food Insecurity (07/11/2023)  Housing: Medium Risk (07/11/2023)  Transportation Needs: Unmet Transportation Needs (07/11/2023)  Utilities: Not At Risk (07/11/2023)  Depression (PHQ2-9): High Risk (08/26/2022)  Tobacco Use: Low Risk  (07/10/2023)     Readmission Risk Interventions    11/08/2022    2:21 PM  Readmission Risk Prevention Plan  Transportation Screening Complete  PCP or Specialist Appt within 5-7 Days Complete  Home Care Screening Complete  Medication Review (RN CM) Complete

## 2023-07-13 NOTE — Discharge Summary (Addendum)
Name: Joshua [name] MRN: [id number] DOB: May 15, 1956 67 y.o. PCP: Ceasar Lund, PA  Date of Admission: 07/10/2023  7:59 PM Date of Discharge: 07/13/23 Attending Physician: Leroy Sea, MD  Discharge Diagnosis: Principal Problem:   DKA (diabetic ketoacidosis) (HCC) Active Problems:   Hyperlipidemia   GERD (gastroesophageal reflux disease)   Acute renal failure superimposed on stage 3b chronic kidney disease (HCC)   Essential hypertension   Diabetic ulcer of left midfoot associated with diabetes mellitus due to underlying condition, with fat layer exposed (HCC)   Pharyngitis   Lactic acidosis   SIRS (systemic inflammatory response syndrome) (HCC)   Elevated troponin   Discharge Medications: Allergies as of 07/13/2023       Reactions   Chlorthalidone Other (See Comments)   Causes dehydration, kidneys shut down        Medication List     TAKE these medications    acetaminophen 325 MG tablet Commonly known as: TYLENOL Take 2 tablets (650 mg total) by mouth every 6 (six) hours as needed for mild pain (or Fever >/= 101).   allopurinol 100 MG tablet Commonly known as: ZYLOPRIM Take 1 tablet (100 mg total) by mouth daily.   aspirin EC 81 MG tablet Take 1 tablet (81 mg total) by mouth daily.   atorvastatin 80 MG tablet Commonly known as: LIPITOR Take 1 tablet (80 mg total) by mouth daily.   Blood Glucose Monitoring Suppl Devi 1 each by Does not apply route in the morning, at noon, and at bedtime. May substitute to any manufacturer covered by patient's insurance.   BLOOD GLUCOSE TEST STRIPS Strp 1 each by In Vitro route 4 (four) times daily. May substitute to any manufacturer covered by patient's insurance.   dapagliflozin propanediol 10 MG Tabs tablet Commonly known as: Farxiga Take 1 tablet (10 mg total) by mouth daily before breakfast.   DULoxetine 20 MG capsule Commonly known as: CYMBALTA Take 1 capsule (20 mg total) by mouth 2 (two) times daily.    famotidine 20 MG tablet Commonly known as: PEPCID Take 1 tablet (20 mg total) by mouth at bedtime.   furosemide 40 MG tablet Commonly known as: LASIX Take 1 tablet (40 mg total) by mouth 2 (two) times daily.   gabapentin 100 MG capsule Commonly known as: NEURONTIN Take 1 capsule (100 mg total) by mouth at bedtime.   insulin aspart 100 UNIT/ML injection Commonly known as: novoLOG Inject 0-9 Units into the skin 3 (three) times daily with meals. CBG < 70: treat for low blood sugar Implement  CBG 70 - 120: 0 units  CBG 121 - 150: 1 unit  CBG 151 - 200: 2 units  CBG 201 - 250: 3 units  CBG 251 - 300: 5 units  CBG 301 - 350: 7 units  CBG 351 - 400: 9 units  CBG > 400: call MD   isosorbide mononitrate 60 MG 24 hr tablet Commonly known as: IMDUR Take 120 mg (two tablets) in the morning and 60 mg (one tablet) in the evening. What changed:  how much to take how to take this when to take this   Lancet Device Misc 1 each by Does not apply route in the morning, at noon, and at bedtime. May substitute to any manufacturer covered by patient's insurance.   Lancets Misc. Misc 1 each by Does not apply route in the morning, at noon, and at bedtime. May substitute to any manufacturer covered by patient's insurance.   Lantus SoloStar  100 UNIT/ML Solostar Pen Generic drug: insulin glargine Inject 30 units under skin in am and 15 units at night What changed: additional instructions   latanoprost 0.005 % ophthalmic solution Commonly known as: XALATAN Place 1 drop into both eyes at bedtime.   metoprolol succinate 25 MG 24 hr tablet Commonly known as: TOPROL-XL Take 3 tablets (75 mg total) by mouth daily.   nitroGLYCERIN 0.4 MG SL tablet Commonly known as: NITROSTAT Place 1 tablet (0.4 mg total) under the tongue every 5 (five) minutes as needed for chest pain.   pantoprazole 40 MG tablet Commonly known as: PROTONIX Take 1 tablet (40 mg total) by mouth 2 (two) times daily. What  changed: See the new instructions.   povidone-iodine 10 % swab Apply 1 Application topically every 3 (three) days.   ticagrelor 90 MG Tabs tablet Commonly known as: Brilinta Take 1 tablet (90 mg total) by mouth 2 (two) times daily.   vitamin D3 50 MCG (2000 UT) Caps Take 1 capsule (2,000 Units total) by mouth daily. What changed:  medication strength how much to take when to take this               Discharge Care Instructions  (From admission, onward)           Start     Ordered   07/13/23 0000  Discharge wound care:       Comments: Paint L lateral foot ulcer with Betadine twice daily allow to dry. Once dry cover with dry gauze and silicone foam. May lift foam daily to paint with Betadine.  Change foam dressing q3 days and prn soiling.   07/13/23 1025            Disposition and follow-up:   Mr.Joshua [name] was discharged from [facility] in Stable condition.  At the hospital follow up visit please address:  1.  Ensure pt has good glycemic control, instructed him to bring glucose log to the clinic visit.   2.  Labs / imaging needed at time of follow-up: CBC, BMP, A1c, Vitamin D   3.  Pending labs/ test needing follow-up: None  Follow-up Appointments:  Follow-up Information     [facility]. Call today.   Why: Make an hospital follow up appointment in 10-14 days. Contact information: [address] [place] [address] [phone]        [place], Malva [place], [place]. Schedule an appointment as soon as possible for a visit in 1 week(s).   Specialty: Physician Assistant Why: Also with your podiatrist within a week of discharge Contact information: [address] [place] [address] [phone]                 Hospital Course by problem list: Diabetic Ketoacidosis Hypertonic Hyponatremia Pt presented to the ED with vomiting and weakness and found to be in DKA. Was  started on endotool and transitioned to sub q insulin 07/29. A1c in 04/28/23 was 7.8. Suspect DKA was secondary to non-adherence to insulin and dietary indiscretion. On Gabapentin 100 mg daily. States last used GLP 1 once last year. Home regimen is insulin glargine 20 units am and 15 units pm, and tradjenta 5 mg every day. He was suppose to stop tradjenta and start ozempic. Was placed on glargine 30 units in the am and 15 at bedtime CBG and will discharge on this regimen. He has a sliding scale from his endocrinologist office that we will  continue.  Will continue farxiga given HFpEF as his DKA was secondary to non-adherence to insulin regimen. Hyponatremia likely secondary to hyperglycemia. Corrects to 134.   Foot Wound: Is seen by [facility].  He had a foot wound on his left foot. Given worsening in appearance and pain and high risk given uncontrolled diabetes and poor social situation, podiatry performed bedside debridement and recommend betadine, and dressing changes. No current need for antibiotics. Will need outpatient podiatry follow up.    Sore Throat: Resolved. Suspect secondary to episodes of vomiting. Group A strep PCR negative. COVID negative.    AKI on CKDIIIB Resolved. Suspect this was secondary to dehydration from due to DKA. Creatinine at baseline 1.9 at day of discharge.   Chronic diastolic CHF EF 60% on last echocardiogram done few months ago  Home regimen Farxiga 10 mg every day, Lasix 40 mg BID, and metoprolol 75 mg daily. Appeared euvolemic on exam. Restarted home meds while pt was admitted.  HTN: Home regimen of metoprolol succinate 75 mg. This was continued while patient was admitted. HLD c/b CAD: Continue lipitor 80 mg every day, and aspirin 81 mg every day and Brillinta 90 mg daily. On Imdur 60 mg TID. Pt's home meds were continued while pt was hospitalized.  GERD: Pantoprazole 40 mg BID and Famotidine 20 mg prn. Both were continued here. Pt will need OP with GI  given he had EGD last year showing concern for eosinophilic esophagitis and continues to endorse intermittent symptoms of dysphagia. PCP to arrange.  Gout: On allopurinol 100 mg every day. This was continued while pt was here.  MDD: On Cymbalta 20 mg BID and escitalopram 10 mg every day. Will restart Cymbalta here and recommend up-titration in the OP setting. He was started on both during SNF and was amenable to continuing cymbalta. OSA: On CPAP nightly. CPAP was ordered here. States he has CPAP at home but not using given he does not like the mask. He would like to use it and previously had a mask he liked.   Discharge Subjective:  States doing well with no acute concerns.  Discharge Exam:   BP 124/81 (BP Location: Left Arm)   Pulse 75   Temp 97.8 F (36.6 C) (Oral)   Resp 14   Ht 5\' 9"  (1.753 m)   Wt 112.4 kg   SpO2 96%   BMI 36.59 kg/m  Physical Exam:  General: No acute concerns HENT: NCAT Lungs: CTAB Cardiovascular: NSR, good pulses   Abdomen: No TTP, normal bowel sounds MSK: right foot with transmetatarsal amputation Skin: left foot with bandage in place Neuro: alert and oriented x4 Psych: normal mood and normal affect   Pertinent Labs, Studies, and Procedures:     Latest Ref Rng & Units 07/13/2023   12:10 AM 07/11/2023    9:03 AM 07/11/2023    5:09 AM  CBC  WBC 4.0 - 10.5 K/uL 7.0  13.7  15.9   Hemoglobin 13.0 - 17.0 g/dL 45.4  09.8  11.9   Hematocrit 39.0 - 52.0 % 43.5  42.2  45.6   Platelets 150 - 400 K/uL 167  226  218        Latest Ref Rng & Units 07/13/2023   12:10 AM 07/12/2023    3:26 AM 07/11/2023    2:48 PM  CMP  Glucose 70 - 99 mg/dL 147  829  562   BUN 8 - 23 mg/dL 30  37  41   Creatinine  0.61 - 1.24 mg/dL 1.61  0.96  0.45   Sodium 135 - 145 mmol/L 131  134  139   Potassium 3.5 - 5.1 mmol/L 4.0  3.8  3.3   Chloride 98 - 111 mmol/L 95  100  104   CO2 22 - 32 mmol/L 24  21  25    Calcium 8.9 - 10.3 mg/dL 8.7  8.7  8.9     BHB 4.09 LA elevated initially  but down-trending  DG Chest Port 1 View  Result Date: 07/10/2023 CLINICAL DATA:  Questionable sepsis - evaluate for abnormality EXAM: PORTABLE CHEST 1 VIEW COMPARISON:  Chest x-ray 08/16/2019, CT chest 07/18/2016 FINDINGS: The heart and mediastinal contours are unchanged. Chronic calcified hilar lymph nodes likely sequelae of prior granulomatous disease. No focal consolidation. No pulmonary edema. No pleural effusion. No pneumothorax. No acute osseous abnormality. Severe degenerative changes of bilateral shoulders, right greater than left. IMPRESSION: No active disease. Electronically Signed   By: Tish Frederickson M.D.   On: 07/10/2023 20:56     Discharge Instructions: Discharge Instructions     Call MD for:  difficulty breathing, headache or visual disturbances   Complete by: As directed    Call MD for:  extreme fatigue   Complete by: As directed    Call MD for:  hives   Complete by: As directed    Call MD for:  persistant dizziness or light-headedness   Complete by: As directed    Call MD for:  persistant nausea and vomiting   Complete by: As directed    Call MD for:  redness, tenderness, or signs of infection (pain, swelling, redness, odor or green/yellow discharge around incision site)   Complete by: As directed    Call MD for:  severe uncontrolled pain   Complete by: As directed    Call MD for:  temperature >100.4   Complete by: As directed    Diet Carb Modified   Complete by: As directed    Discharge wound care:   Complete by: As directed    Paint L lateral foot ulcer with Betadine twice daily allow to dry. Once dry cover with dry gauze and silicone foam. May lift foam daily to paint with Betadine.  Change foam dressing q3 days and prn soiling.   Increase activity slowly   Complete by: As directed        Signed: Gwenevere Abbot, MD Eligha Bridegroom. [facility] Internal Medicine Residency, PGY-2 07/13/2023, 10:27 AM

## 2023-07-13 NOTE — Progress Notes (Deleted)
Overview: Joshua [name] is a 67 y.o. with PMH of T2DM with hx of DKA and c/b peripheral neuropathy, and diabetic gastroparesis, HTN, HLD, CKD3B admitted for DKA on hospital day 2.   Subjective:  Overnight:NAEON  Bedside excisional debridement yesterday. States doing well with no acute concerns.   Objective: Afebrile, Normotensive, sating well on RA.   Vital signs in last 24 hours: Vitals:   07/12/23 1615 07/12/23 2118 07/13/23 0023 07/13/23 0500  BP: 121/84 (!) 147/78 127/80   Pulse: 75 69 66   Resp: 16 18 16    Temp: 97.7 F (36.5 C) 97.9 F (36.6 C) 97.6 F (36.4 C)   TempSrc: Oral Oral Oral   SpO2: 94% 99% 96%   Weight:    112.4 kg  Height:       Supplemental O2: Room Air Last BM Date : 07/10/23 SpO2: 96 % Filed Weights   07/10/23 2004 07/12/23 0500 07/13/23 0500  Weight: 115.7 kg 112.4 kg 112.4 kg    Intake/Output Summary (Last 24 hours) at 07/13/2023 4782 Last data filed at 07/12/2023 1553 Gross per 24 hour  Intake 240 ml  Output 1100 ml  Net -860 ml   Net IO Since Admission: 4,794.69 mL [07/13/23 0610]  Physical Exam General: No acute concerns HENT: NCAT Lungs: CTAB Cardiovascular: NSR, good pulses   Abdomen: No TTP, normal bowel sounds MSK: right foot with transmetatarsal amputation Skin: left foot with bandage in place Neuro: alert and oriented x4 Psych: normal mood and normal affect  Diagnostics CBG 200 BMP with Na 131, Cl 95, K 4.0, Creatinine 1.91 from 2.45, AG 12 CBC yesterday with resolved leukocytosis,     Assessment/Plan: Principal Problem:   DKA (diabetic ketoacidosis) (HCC) Active Problems:   Hyperlipidemia   GERD (gastroesophageal reflux disease)   Acute renal failure superimposed on stage 3b chronic kidney disease (HCC)   Essential hypertension   Diabetic ulcer of left midfoot associated with diabetes mellitus due to underlying condition, with fat layer exposed (HCC)   Pharyngitis   Lactic acidosis   SIRS (systemic  inflammatory response syndrome) (HCC)   Elevated troponin  DKA:  Resolved. Transitioned to sub q insulin 07/29. A1c in 04/28/23 was 7.8. Suspect DKA was secondary to non-adherence to insulin and dietary indiscretion. On Gabapentin 100 mg daily. States last used GLP 1 once last year. Home regimen is insulin glargine 20 units am and 15 units pm, and tradjenta 5 mg every day. He was suppose to stop tradjenta and start ozempic. Was placed on glargine 25 units in the am and 10 at bedtime CBG >300 yesterday. He used 30 units SSI yesterday. Increase glargine to 30 units am and 15 units nightly. Start on 3 units mealtime. Continue farxiga. Continue rSSI.  Foot Wound: Was seen by podiatry. Given worsening in appearance and pain and high risk given uncontrolled diabetes and poor social situation, podiatry performed bedside debridement and recommend betadine, and dressing changes. No current need for antibiotics.   Sore Throat: Resolved. Suspect secondary to episodes of vomiting. Group A strep PCR negative. COVID negative.   AKI on CKDIIIB Resolved. Suspect this was secondary to dehydration from due to DKA.  Chronic Problems CHF: Home regimen Farxiga 10 mg every day, Lasix 40 mg BID, and metoprolol 75 mg daily. Appears euvolemic on exam. Restarted home meds while pt was admitted.  HTN: Home regimen of metoprolol succinate 75 mg. This was continued while patient was admitted. HLD c/b CAD: Continue lipitor 80 mg every day, and  aspirin 81 mg every day and Brillinta 90 mg daily. On Imdur 60 mg TID. Pt's home meds were continued while pt was hospitalized.  GERD: Pantoprazole 40 mg BID and Famotidine 20 mg prn. Both were continued here. Pt will need OP with GI given he had EGD last year showing concern for eosinophilic esophagitis and continues to endorse intermittent symptoms of dysphagia. PCP to arrange.  BPH: Was on tamsulosin 0.4 mg every day. This was continued while the patient was here.  Gout: On  allopurinol 100 mg every day. This was continued while pt was here.  MDD: On Cymbalta 20 mg BID and escitalopram 10 mg every day. Will restart Cymbalta here and recommend up-titration in the OP setting.  OSA: On CPAP nightly. CPAP was ordered here. Unsure if he is complaint at home given current living situation.   Diet: HH/Carb IVF: None, VTE: Enoxaparin Code: Full PT/OT recs: None,  Prior to Admission Living Arrangement: Home Anticipated Discharge Location: Home Barriers to Discharge: Medical Stability Dispo: Anticipated discharge in approximately 1 day(s).   Joshua Abbot, MD Joshua [name]. [facility] Internal Medicine Residency, PGY-3  Pager: [phone] After 5 pm and on weekends: Please call the on-call pager

## 2023-07-13 NOTE — Discharge Instructions (Signed)
Follow with Primary MD Turmel, Caleb P, PA in 7 days, follow-up with your podiatrist within a week of discharge.  Keep your feet clean and dry  Get CBC, CMP, 2 view Chest X ray -  checked next visit with your primary MD    Activity: As tolerated with Full fall precautions use walker/cane & assistance as needed  Disposition Home    Diet: Heart Healthy Low Carb  Accuchecks 4 times/day, Once in AM empty stomach and then before each meal. Log in all results and show them to your Prim.MD in 3 days. If any glucose reading is under 80 or above 300 call your Prim MD immidiately. Follow Low glucose instructions for glucose under 80 as instructed.   Special Instructions: If you have smoked or chewed Tobacco  in the last 2 yrs please stop smoking, stop any regular Alcohol  and or any Recreational drug use.  On your next visit with your primary care physician please Get Medicines reviewed and adjusted.  Please request your Prim.MD to go over all Hospital Tests and Procedure/Radiological results at the follow up, please get all Hospital records sent to your Prim MD by signing hospital release before you go home.  If you experience worsening of your admission symptoms, develop shortness of breath, life threatening emergency, suicidal or homicidal thoughts you must seek medical attention immediately by calling 911 or calling your MD immediately  if symptoms less severe.  You Must read complete instructions/literature along with all the possible adverse reactions/side effects for all the Medicines you take and that have been prescribed to you. Take any new Medicines after you have completely understood and accpet all the possible adverse reactions/side effects.

## 2023-07-13 NOTE — Plan of Care (Signed)

## 2023-07-21 DIAGNOSIS — I10 Essential (primary) hypertension: Secondary | ICD-10-CM | POA: Diagnosis not present

## 2023-07-21 DIAGNOSIS — L97529 Non-pressure chronic ulcer of other part of left foot with unspecified severity: Secondary | ICD-10-CM | POA: Diagnosis not present

## 2023-07-21 DIAGNOSIS — E1122 Type 2 diabetes mellitus with diabetic chronic kidney disease: Secondary | ICD-10-CM | POA: Diagnosis not present

## 2023-07-21 DIAGNOSIS — E113493 Type 2 diabetes mellitus with severe nonproliferative diabetic retinopathy without macular edema, bilateral: Secondary | ICD-10-CM | POA: Diagnosis not present

## 2023-07-21 DIAGNOSIS — I5042 Chronic combined systolic (congestive) and diastolic (congestive) heart failure: Secondary | ICD-10-CM | POA: Diagnosis not present

## 2023-07-21 DIAGNOSIS — Z89411 Acquired absence of right great toe: Secondary | ICD-10-CM | POA: Diagnosis not present

## 2023-07-21 DIAGNOSIS — Z794 Long term (current) use of insulin: Secondary | ICD-10-CM | POA: Diagnosis not present

## 2023-07-21 DIAGNOSIS — K219 Gastro-esophageal reflux disease without esophagitis: Secondary | ICD-10-CM | POA: Diagnosis not present

## 2023-07-21 DIAGNOSIS — E11621 Type 2 diabetes mellitus with foot ulcer: Secondary | ICD-10-CM | POA: Diagnosis not present

## 2023-07-21 DIAGNOSIS — I110 Hypertensive heart disease with heart failure: Secondary | ICD-10-CM | POA: Diagnosis not present

## 2023-07-27 ENCOUNTER — Encounter: Payer: Self-pay | Admitting: Student

## 2023-07-27 ENCOUNTER — Other Ambulatory Visit: Payer: Self-pay

## 2023-07-27 ENCOUNTER — Ambulatory Visit (INDEPENDENT_AMBULATORY_CARE_PROVIDER_SITE_OTHER): Payer: 59 | Admitting: Student

## 2023-07-27 ENCOUNTER — Ambulatory Visit (INDEPENDENT_AMBULATORY_CARE_PROVIDER_SITE_OTHER): Payer: 59

## 2023-07-27 VITALS — BP 139/75 | HR 102 | Temp 98.1°F | Ht 69.0 in | Wt 250.0 lb

## 2023-07-27 DIAGNOSIS — E559 Vitamin D deficiency, unspecified: Secondary | ICD-10-CM | POA: Diagnosis not present

## 2023-07-27 DIAGNOSIS — Z0000 Encounter for general adult medical examination without abnormal findings: Secondary | ICD-10-CM

## 2023-07-27 DIAGNOSIS — E785 Hyperlipidemia, unspecified: Secondary | ICD-10-CM | POA: Diagnosis not present

## 2023-07-27 DIAGNOSIS — Z794 Long term (current) use of insulin: Secondary | ICD-10-CM | POA: Diagnosis not present

## 2023-07-27 DIAGNOSIS — E1110 Type 2 diabetes mellitus with ketoacidosis without coma: Secondary | ICD-10-CM | POA: Diagnosis not present

## 2023-07-27 DIAGNOSIS — E782 Mixed hyperlipidemia: Secondary | ICD-10-CM

## 2023-07-27 MED ORDER — GABAPENTIN 100 MG PO CAPS: 100.0000 mg | ORAL_CAPSULE | Freq: Every day | ORAL | 0 refills | Status: DC

## 2023-07-27 MED ORDER — INSULIN LISPRO (1 UNIT DIAL) 100 UNIT/ML (KWIKPEN): 5.0000 [IU] | PEN_INJECTOR | Freq: Three times a day (TID) | SUBCUTANEOUS | 11 refills | Status: DC

## 2023-07-27 MED ORDER — LANTUS SOLOSTAR 100 UNIT/ML ~~LOC~~ SOPN: PEN_INJECTOR | SUBCUTANEOUS | 0 refills | Status: DC

## 2023-07-27 MED ORDER — BLOOD GLUCOSE MONITOR SYSTEM W/DEVICE KIT: 1.0000 | PACK | Freq: Three times a day (TID) | 0 refills | Status: AC

## 2023-07-27 MED ORDER — ATORVASTATIN CALCIUM 80 MG PO TABS: 80.0000 mg | ORAL_TABLET | Freq: Every day | ORAL | 3 refills | Status: DC

## 2023-07-27 NOTE — Progress Notes (Signed)
Subjective:   Joshua [name] is a 67 y.o. male who presents for an Initial Medicare Annual Wellness Visit.  Visit Complete: In person  Patient Medicare AWV questionnaire was completed by the patient on 07/27/2023; I have confirmed that all information answered by patient is correct and no changes since this date.  Review of Systems    Defer to PCP       Objective:    Today's Vitals   07/27/23 1502 07/27/23 1503  BP: 139/75   Pulse: (!) 102   Temp: 98.1 F (36.7 C)   TempSrc: Oral   SpO2: 100%   Weight: 250 lb (113.4 kg)   Height: 5\' 9"  (1.753 m)   PainSc:  0-No pain   Body mass index is 36.92 kg/m.     07/27/2023    3:04 PM 07/27/2023    1:46 PM 07/10/2023    8:05 PM 11/26/2022    2:45 PM 11/26/2022    2:27 PM 11/06/2022    3:56 PM 11/05/2022    4:29 PM  Advanced Directives  Does Patient Have a Medical Advance Directive? No No No  No  No  Would patient like information on creating a medical advance directive? No - Patient declined No - Patient declined No - Patient declined No - Patient declined  No - Patient declined     Current Medications (verified) Outpatient Encounter Medications as of 07/27/2023  Medication Sig   Accu-Chek Softclix Lancets lancets Use in the morning, at noon, and at bedtime.   acetaminophen (TYLENOL) 325 MG tablet Take 2 tablets (650 mg total) by mouth every 6 (six) hours as needed for mild pain (or Fever >/= 101).   allopurinol (ZYLOPRIM) 100 MG tablet Take 1 tablet (100 mg total) by mouth daily.   aspirin EC 81 MG tablet Take 1 tablet (81 mg total) by mouth daily.   atorvastatin (LIPITOR) 80 MG tablet Take 1 tablet (80 mg total) by mouth daily.   Cholecalciferol (VITAMIN D) 50 MCG (2000 UT) tablet Take 1 tablet (2,000 Units total) by mouth daily.   DULoxetine (CYMBALTA) 20 MG capsule Take 1 capsule (20 mg total) by mouth 2 (two) times daily.   famotidine (PEPCID) 20 MG tablet Take 1 tablet (20 mg total) by mouth at bedtime.   furosemide  (LASIX) 40 MG tablet Take 1 tablet (40 mg total) by mouth 2 (two) times daily.   gabapentin (NEURONTIN) 100 MG capsule Take 1 capsule (100 mg total) by mouth at bedtime.   Glucose Blood (BLOOD GLUCOSE TEST STRIPS) STRP Use 4 (four) times daily.   insulin glargine (LANTUS SOLOSTAR) 100 UNIT/ML Solostar Pen Inject 30 units under skin in the morning and 15 units at night   insulin lispro (HUMALOG KWIKPEN) 100 UNIT/ML KwikPen Inject 5-14 Units into the skin 3 (three) times daily with meals. Inject 5 units with meals, add the following per glucose level. CBG 70 - 120: 0 units CBG 121 - 150: 1 unit CBG 151 - 200: 2 units CBG 201 - 250: 3 units CBG 251 - 300: 5 units CBG 301 - 350: 7 units CBG 351 - 400: 9 units   Insulin Pen Needle 32G X 4 MM MISC Use with Lantus   isosorbide mononitrate (IMDUR) 60 MG 24 hr tablet Take 2 tablets (120mg )  in the morning and 1 tablet (60mg ) in the evening.   Lancet Device MISC 1 each by Does not apply route in the morning, at noon, and at bedtime. May substitute  to any manufacturer covered by patient's insurance.   latanoprost (XALATAN) 0.005 % ophthalmic solution Place 1 drop into both eyes at bedtime.   metoprolol succinate (TOPROL-XL) 25 MG 24 hr tablet Take 3 tablets (75 mg total) by mouth daily.   nitroGLYCERIN (NITROSTAT) 0.4 MG SL tablet Place 1 tablet (0.4 mg total) under the tongue every 5 (five) minutes as needed for chest pain.   pantoprazole (PROTONIX) 40 MG tablet Take 1 tablet (40 mg total) by mouth 2 (two) times daily.   povidone-iodine 10 % swab Apply 1 Application topically every 3 (three) days.   ticagrelor (BRILINTA) 90 MG TABS tablet Take 1 tablet (90 mg total) by mouth 2 (two) times daily.   No facility-administered encounter medications on file as of 07/27/2023.    Allergies (verified) Chlorthalidone   History: Past Medical History:  Diagnosis Date   Acute coronary syndrome (HCC)    Anemia    B12 deficiency    Chest pain    CHF (congestive  heart failure) (HCC)    Chronic combined systolic and diastolic CHF (congestive heart failure) (HCC)    a. 07/2015 Echo: EF 45-50%, mod LVH, mid apical/antsept AK, Gr 1 DD.   Chronic low back pain    CKD (chronic kidney disease), stage III (HCC)    Constipation    COPD (chronic obstructive pulmonary disease) (HCC)    Coronary artery disease    a. 2012 s/p MI/PCI: s/p DES to mid/dist RCA and overlapping DES to LAD;  b. 07/2015 Cath: LAD 10% ISR, D1 40(jailed), LCX 66m, OM2 30, OM3 30, RCA 46m ISR, 10d ISR; c. 07/2016 NSTEMI/PCI: LAD 85ost/p/m (3.5x20 Synergy DES overlapping prior stent), D1 40, LCX 89m, OM2 95 (2.75x16 Synergy DES), OM3 30, RCA 15m, 10d.   Depression    Diabetic gastroparesis (HCC)    Diabetic polyneuropathy (HCC)    DKA (diabetic ketoacidoses) 03/14/2018   GERD (gastroesophageal reflux disease)    Gout    Heart failure (HCC)    Heart murmur    History of MI (myocardial infarction)    Hyperlipemia    Hypertension    Hypertensive heart disease    Hypokalemia    Ischemic cardiomyopathy    a. 07/2015 Echo: EF 45-50%, mod LVH, mid-apicalanteroseptal AK, Gr1 DD.   Joint pain    Kidney problem    Morbid obesity (HCC)    OSA on CPAP    Osteoarthritis    Osteomyelitis (HCC)    a. 07/2016 MTP joint of R great toe s/p transmetatarsal amputation.   Other fatigue    Polyneuropathy in diabetes(357.2)    Pulmonary sarcoidosis (HCC)    "problems w/it years ago; no problems anymore" (07/03/2014)   Rectal bleeding    Shortness of breath    Shortness of breath on exertion    Sleep apnea    Stomach ulcer    Swallowing difficulty    Type II diabetes mellitus (HCC)    Past Surgical History:  Procedure Laterality Date   ABDOMINAL AORTOGRAM W/LOWER EXTREMITY N/A 11/29/2022   Procedure: ABDOMINAL AORTOGRAM W/LOWER EXTREMITY;  Surgeon: Maeola Harman, MD;  Location: [facility] INVASIVE CV LAB;  Service: Cardiovascular;  Laterality: N/A;   AMPUTATION Right 07/24/2016   Procedure:  TRANSMETATARSAL FOOT AMPUTATION;  Surgeon: Nadara Mustard, MD;  Location: [facility] OR;  Service: Orthopedics;  Laterality: Right;   APPLICATION OF WOUND VAC Left 11/30/2022   Procedure: APPLICATION OF WOUND VAC;  Surgeon: Edwin Cap, DPM;  Location: [facility] OR;  Service:  Podiatry;  Laterality: Left;   BALLOON DILATION N/A 03/21/2018   Procedure: BALLOON DILATION;  Surgeon: Bernette Redbird, MD;  Location: [facility] ENDOSCOPY;  Service: Endoscopy;  Laterality: N/A;   BIOPSY  03/16/2022   Procedure: BIOPSY;  Surgeon: Charlott Rakes, MD;  Location: [facility] ENDOSCOPY;  Service: Endoscopy;;   BREAST LUMPECTOMY Right ~ 2000   "benign"   CARDIAC CATHETERIZATION N/A 07/30/2015   Procedure: Left Heart Cath and Coronary Angiography;  Surgeon: Kathleene Hazel, MD;  Location: [facility] INVASIVE CV LAB;  Service: Cardiovascular;  Laterality: N/A;   CARDIAC CATHETERIZATION N/A 07/19/2016   Procedure: Left Heart Cath and Coronary Angiography;  Surgeon: Lyn Records, MD;  Location: [facility] INVASIVE CV LAB;  Service: Cardiovascular;  Laterality: N/A;   CARDIAC CATHETERIZATION N/A 07/29/2016   Procedure: Coronary Stent Intervention;  Surgeon: Lyn Records, MD;  Location: [facility] INVASIVE CV LAB;  Service: Cardiovascular;  Laterality: N/A;   CARDIAC CATHETERIZATION N/A 07/29/2016   Procedure: Intravascular Ultrasound/IVUS;  Surgeon: Lyn Records, MD;  Location: [facility] INVASIVE CV LAB;  Service: Cardiovascular;  Laterality: N/A;   CATARACT EXTRACTION W/ INTRAOCULAR LENS  IMPLANT, BILATERAL Bilateral 2014-2015   COLONOSCOPY WITH PROPOFOL N/A 08/22/2017   Procedure: COLONOSCOPY WITH PROPOFOL;  Surgeon: Charlott Rakes, MD;  Location: [facility] ENDOSCOPY;  Service: Endoscopy;  Laterality: N/A;   COLONOSCOPY WITH PROPOFOL N/A 03/16/2022   Procedure: COLONOSCOPY WITH PROPOFOL;  Surgeon: Charlott Rakes, MD;  Location: [facility] ENDOSCOPY;  Service: Endoscopy;  Laterality: N/A;   CORONARY ANGIOPLASTY WITH STENT PLACEMENT  10/31/2011   30 % MID ATRIOVENTRICULAR GROOVE  CX STENOSIS. 90 % MID RCA.PTA TO LAD/STENTING OF TANDEM 60-70%. LAD STENOSIS 99% REDUCED TO 0%.3.0 X PROMUS DES POSTDILATED TO 3.25MM.   CORONARY ANGIOPLASTY WITH STENT PLACEMENT  07/03/2014   "2"   CORONARY STENT INTERVENTION N/A 04/09/2019   Procedure: CORONARY STENT INTERVENTION;  Surgeon: Corky Crafts, MD;  Location: [facility] INVASIVE CV LAB;  Service: Cardiovascular;  Laterality: N/A;   CORONARY STENT INTERVENTION N/A 08/22/2019   Procedure: CORONARY STENT INTERVENTION;  Surgeon: Swaziland, Peter M, MD;  Location: [facility] INVASIVE CV LAB;  Service: Cardiovascular;  Laterality: N/A;   ESOPHAGOGASTRODUODENOSCOPY (EGD) WITH PROPOFOL N/A 03/21/2018   Procedure: ESOPHAGOGASTRODUODENOSCOPY (EGD) WITH PROPOFOL;  Surgeon: Bernette Redbird, MD;  Location: [facility] ENDOSCOPY;  Service: Endoscopy;  Laterality: N/A;   ESOPHAGOGASTRODUODENOSCOPY (EGD) WITH PROPOFOL N/A 03/16/2022   Procedure: ESOPHAGOGASTRODUODENOSCOPY (EGD) WITH PROPOFOL;  Surgeon: Charlott Rakes, MD;  Location: [facility] ENDOSCOPY;  Service: Endoscopy;  Laterality: N/A;   GRAFT APPLICATION Left 11/30/2022   Procedure: GRAFT APPLICATION;  Surgeon: Edwin Cap, DPM;  Location: [facility] OR;  Service: Podiatry;  Laterality: Left;   I & D EXTREMITY Right 10/30/2013   Procedure: IRRIGATION AND DEBRIDEMENT RIGHT SMALL FINGER POSSIBLE SECONDARY WOUND CLOSURE;  Surgeon: Marlowe Shores, MD;  Location: [facility] ORS;  Service: Orthopedics;  Laterality: Right;  Mina Marble is available after 4pm   I & D EXTREMITY Right 11/01/2013   Procedure:  IRRIGATION AND DEBRIDEMENT RIGHT  PROXIMAL PHALANGEAL LEVEL AMPUTATION;  Surgeon: Marlowe Shores, MD;  Location: [facility] ORS;  Service: Orthopedics;  Laterality: Right;   INCISION AND DRAINAGE Right 10/28/2013   Procedure: INCISION AND DRAINAGE Right small finger;  Surgeon: Marlowe Shores, MD;  Location: [facility] ORS;  Service: Orthopedics;  Laterality: Right;   INCISION AND DRAINAGE OF WOUND Left 11/07/2022   Procedure: IRRIGATION AND  DEBRIDEMENT WOUND;  Surgeon: Pilar Plate, DPM;  Location: [facility] OR;  Service: Podiatry;  Laterality:  Left;  Left foot ucler debridement and bone biopsy   LEFT HEART CATH Left 11/03/2011   Procedure: LEFT HEART CATH;  Surgeon: Lennette Bihari, MD;  Location: [facility] CATH LAB;  Service: Cardiovascular;  Laterality: Left;   LEFT HEART CATH AND CORONARY ANGIOGRAPHY N/A 04/09/2019   Procedure: LEFT HEART CATH AND CORONARY ANGIOGRAPHY;  Surgeon: Corky Crafts, MD;  Location: [facility] INVASIVE CV LAB;  Service: Cardiovascular;  Laterality: N/A;   LEFT HEART CATHETERIZATION WITH CORONARY ANGIOGRAM N/A 07/03/2014   Procedure: LEFT HEART CATHETERIZATION WITH CORONARY ANGIOGRAM;  Surgeon: Lesleigh Noe, MD;  Location: [facility] CATH LAB;  Service: Cardiovascular;  Laterality: N/A;   PERCUTANEOUS CORONARY STENT INTERVENTION (PCI-S) Left 11/03/2011   Procedure: PERCUTANEOUS CORONARY STENT INTERVENTION (PCI-S);  Surgeon: Lennette Bihari, MD;  Location: [facility] CATH LAB;  Service: Cardiovascular;  Laterality: Left;   RIGHT/LEFT HEART CATH AND CORONARY ANGIOGRAPHY N/A 08/22/2019   Procedure: RIGHT/LEFT HEART CATH AND CORONARY ANGIOGRAPHY;  Surgeon: Swaziland, Peter M, MD;  Location: [facility] INVASIVE CV LAB;  Service: Cardiovascular;  Laterality: N/A;   TOE AMPUTATION Right ~ 2010   "2nd toe"   TOE AMPUTATION Right 2012   "3rd toe"   WOUND DEBRIDEMENT Left 11/30/2022   Procedure: DEBRIDEMENT WOUND;  Surgeon: Edwin Cap, DPM;  Location: [facility] OR;  Service: Podiatry;  Laterality: Left;   Family History  Problem Relation Age of Onset   Sudden death Mother    Heart attack Maternal Aunt    Diabetes Maternal Grandmother    Hypertension Maternal Grandmother    Sudden death Other    Neuropathy Neg Hx    Social History   Socioeconomic History   Marital status: Married    Spouse name: Alvino Chapel   Number of children: 3   Years of education: Not on file   Highest education level: Not on file  Occupational History    Comment:  disabled  Tobacco Use   Smoking status: Never   Smokeless tobacco: Never  Vaping Use   Vaping status: Never Used  Substance and Sexual Activity   Alcohol use: Yes    Comment: beer occassionally   Drug use: No   Sexual activity: Yes    Partners: Female  Other Topics Concern   Not on file  Social History Narrative   Not on file   Social Determinants of Health   Financial Resource Strain: Medium Risk (07/27/2023)   Overall Financial Resource Strain (CARDIA)    Difficulty of Paying Living Expenses: Somewhat hard  Food Insecurity: Food Insecurity Present (07/27/2023)   Hunger Vital Sign    Worried About Running Out of Food in the Last Year: Sometimes true    Ran Out of Food in the Last Year: Sometimes true  Transportation Needs: Unmet Transportation Needs (07/27/2023)   PRAPARE - Transportation    Lack of Transportation (Medical): Yes    Lack of Transportation (Non-Medical): Yes  Physical Activity: Inactive (07/27/2023)   Exercise Vital Sign    Days of Exercise per Week: 0 days    Minutes of Exercise per Session: 0 min  Stress: Stress Concern Present (07/27/2023)   [employer] of Occupational Health - Occupational Stress Questionnaire    Feeling of Stress : Very much  Social Connections: Moderately Isolated (07/27/2023)   Social Connection and Isolation Panel [NHANES]    Frequency of Communication with Friends and Family: Once a week    Frequency of Social Gatherings with Friends and Family: Never    Attends Religious Services:  Never    Active Member of Clubs or Organizations: Yes    Attends Banker Meetings: More than 4 times per year    Marital Status: Married    Tobacco Counseling Counseling given: Not Answered   Clinical Intake:  Pre-visit preparation completed: Yes  Pain : No/denies pain Pain Score: 0-No pain     Nutritional Risks: None Diabetes: Yes CBG done?: No Did pt. bring in CBG monitor from home?: No  How often do you need to have  someone help you when you read instructions, pamphlets, or other written materials from your doctor or pharmacy?: 1 - Never What is the last grade level you completed in school?: 16 years  Interpreter Needed?: No  Information entered by ::  ,cma   Activities of Daily Living    07/27/2023    3:04 PM 07/27/2023    1:46 PM  In your present state of health, do you have any difficulty performing the following activities:  Hearing? 0 0  Vision? 0 0  Difficulty concentrating or making decisions? 0 0  Walking or climbing stairs? 0 1  Dressing or bathing? 0 0  Doing errands, shopping? 0 0    Patient Care Team: Gwenevere Abbot, MD as PCP - General (Internal Medicine) Lennette Bihari, MD as PCP - Cardiology (Cardiology) Charlott Rakes, MD as Consulting Physician (Gastroenterology) Mayo, Eugenio Hoes, FNP as WOC Nurse (Nurse Practitioner)  Indicate any recent Medical Services you may have received from other than [facility] providers in the past year (date may be approximate).     Assessment:   This is a routine wellness examination for Tierre.  Hearing/Vision screen No results found.  Dietary issues and exercise activities discussed:     Goals Addressed   None   Depression Screen    07/27/2023    3:04 PM 07/27/2023    1:46 PM 08/26/2022    3:24 PM 08/11/2021    8:40 AM 03/06/2020    3:59 PM 04/19/2019    1:14 PM 11/01/2016    9:32 AM  PHQ 2/9 Scores  PHQ - 2 Score 0 0 6 5 2 1  0  PHQ- 9 Score   12 20       Fall Risk    07/27/2023    3:04 PM 07/27/2023    1:46 PM 04/26/2023    2:29 PM 02/22/2023    2:22 PM 11/18/2022    3:59 PM  Fall Risk   Falls in the past year? 1 1 1 1 1   Number falls in past yr: 1 1 0 1 1  Injury with Fall? 0 0 0 1 0  Risk for fall due to : History of fall(s) History of fall(s) History of fall(s);Impaired balance/gait;Impaired mobility History of fall(s);Impaired balance/gait;Impaired mobility Impaired balance/gait;Impaired mobility  Follow up Falls  evaluation completed;Falls prevention discussed Falls evaluation completed;Falls prevention discussed Falls evaluation completed Falls evaluation completed Falls evaluation completed    MEDICARE RISK AT HOME:  Medicare Risk at Home - 07/27/23 1506     Any stairs in or around the home? Yes    If so, are there any without handrails? No    Home free of loose throw rugs in walkways, pet beds, electrical cords, etc? Yes    Adequate lighting in your home to reduce risk of falls? Yes    Life alert? No    Use of a cane, walker or w/c? Yes    Grab bars in the bathroom? No    Shower  chair or bench in shower? No    Elevated toilet seat or a handicapped toilet? No             TIMED UP AND GO:  Was the test performed? No    Cognitive Function:        07/27/2023    3:04 PM  6CIT Screen  What Year? 0 points  What month? 0 points  What time? 0 points  Count back from 20 0 points  Months in reverse 0 points  Repeat phrase 0 points  Total Score 0 points    Immunizations Immunization History  Administered Date(s) Administered   Influenza,inj,Quad PF,6+ Mos 10/30/2013, 12/11/2018, 12/09/2022   Influenza-Unspecified 09/07/2016, 08/13/2022   PFIZER(Purple Top)SARS-COV-2 Vaccination 03/07/2020, 04/01/2020, 10/11/2020, 03/20/2021   Pfizer Covid-19 Vaccine Bivalent Booster 91yrs & up 09/08/2021   Pneumococcal Polysaccharide-23 10/30/2013, 12/11/2018   Td 10/29/2013    TDAP status: Due, Education has been provided regarding the importance of this vaccine. Advised may receive this vaccine at local pharmacy or Health Dept. Aware to provide a copy of the vaccination record if obtained from local pharmacy or Health Dept. Verbalized acceptance and understanding.  Flu Vaccine status: Due, Education has been provided regarding the importance of this vaccine. Advised may receive this vaccine at local pharmacy or Health Dept. Aware to provide a copy of the vaccination record if obtained from local  pharmacy or Health Dept. Verbalized acceptance and understanding.  Pneumococcal vaccine status: Due, Education has been provided regarding the importance of this vaccine. Advised may receive this vaccine at local pharmacy or Health Dept. Aware to provide a copy of the vaccination record if obtained from local pharmacy or Health Dept. Verbalized acceptance and understanding.  Covid-19 vaccine status: Completed vaccines  Qualifies for Shingles Vaccine? No   Zostavax completed No   Shingrix Completed?: No.    Education has been provided regarding the importance of this vaccine. Patient has been advised to call insurance company to determine out of pocket expense if they have not yet received this vaccine. Advised may also receive vaccine at local pharmacy or Health Dept. Verbalized acceptance and understanding.  Screening Tests Health Maintenance  Topic Date Due   Diabetic kidney evaluation - Urine ACR  Never done   Hepatitis C Screening  Never done   Zoster Vaccines- Shingrix (1 of 2) Never done   COVID-19 Vaccine (6 - 2023-24 season) 08/13/2022   INFLUENZA VACCINE  07/14/2023   HEMOGLOBIN A1C  10/29/2023   DTaP/Tdap/Td (2 - Tdap) 10/30/2023   FOOT EXAM  04/13/2024   OPHTHALMOLOGY EXAM  05/17/2024   Diabetic kidney evaluation - eGFR measurement  07/12/2024   Medicare Annual Wellness (AWV)  07/26/2024   Colonoscopy  03/16/2032   HIV Screening  Completed   HPV VACCINES  Aged Out    Health Maintenance  Health Maintenance Due  Topic Date Due   Diabetic kidney evaluation - Urine ACR  Never done   Hepatitis C Screening  Never done   Zoster Vaccines- Shingrix (1 of 2) Never done   COVID-19 Vaccine (6 - 2023-24 season) 08/13/2022   INFLUENZA VACCINE  07/14/2023    Colorectal cancer screening: Type of screening: Colonoscopy. Completed 03/16/2022. Repeat every 10 years  Lung Cancer Screening: (Low Dose CT Chest recommended if Age 3-80 years, 20 pack-year currently smoking OR have quit  w/in 15years.) does not qualify.   Lung Cancer Screening Referral: N/A  Additional Screening:  Hepatitis C Screening: does not qualify; Completed N/A  Vision  Screening: Recommended annual ophthalmology exams for early detection of glaucoma and other disorders of the eye. Is the patient up to date with their annual eye exam?  Yes  Who is the provider or what is the name of the office in which the patient attends annual eye exams? [facility] If pt is not established with a provider, would they like to be referred to a provider to establish care? No .   Dental Screening: Recommended annual dental exams for proper oral hygiene  Diabetic Foot Exam: Diabetic Foot Exam: Completed 04/14/2023  Community Resource Referral / Chronic Care Management: CRR required this visit?  No   CCM required this visit?  No    Plan:     I have personally reviewed and noted the following in the patient's chart:   Medical and social history Use of alcohol, tobacco or illicit drugs  Current medications and supplements including opioid prescriptions. Patient is not currently taking opioid prescriptions. Functional ability and status Nutritional status Physical activity Advanced directives List of other physicians Hospitalizations, surgeries, and ER visits in previous 12 months Vitals Screenings to include cognitive, depression, and falls Referrals and appointments  In addition, I have reviewed and discussed with patient certain preventive protocols, quality metrics, and best practice recommendations. A written personalized care plan for preventive services as well as general preventive health recommendations were provided to patient.     Cala Bradford, CMA   07/27/2023   After Visit Summary: (MyChart) Due to this being a telephonic visit, the after visit summary with patients personalized plan was offered to patient via MyChart   Nurse Notes: Face-To-Face Visit  Mr. Moffa , Thank you for  taking time to come for your Medicare Wellness Visit. I appreciate your ongoing commitment to your health goals. Please review the following plan we discussed and let me know if I can assist you in the future.   These are the goals we discussed:  Goals   None     This is a list of the screening recommended for you and due dates:  Health Maintenance  Topic Date Due   Yearly kidney health urinalysis for diabetes  Never done   Hepatitis C Screening  Never done   Zoster (Shingles) Vaccine (1 of 2) Never done   COVID-19 Vaccine (6 - 2023-24 season) 08/13/2022   Flu Shot  07/14/2023   Hemoglobin A1C  10/29/2023   DTaP/Tdap/Td vaccine (2 - Tdap) 10/30/2023   Complete foot exam   04/13/2024   Eye exam for diabetics  05/17/2024   Yearly kidney function blood test for diabetes  07/12/2024   Medicare Annual Wellness Visit  07/26/2024   Colon Cancer Screening  03/16/2032   HIV Screening  Completed   HPV Vaccine  Aged Out

## 2023-07-27 NOTE — Patient Instructions (Signed)
Please STOP taking [place]. I sent in a new prescription for your short acting insulin which will now be Humalog. Follow dosage instructions. I sent in prescriptions for Atorvastatin and Gabapentin.
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
## 2023-07-28 DIAGNOSIS — E559 Vitamin D deficiency, unspecified: Secondary | ICD-10-CM | POA: Insufficient documentation

## 2023-07-28 MED ORDER — VITAMIN D 50 MCG (2000 UT) PO TABS: 2000.0000 [IU] | ORAL_TABLET | Freq: Every day | ORAL | 3 refills | Status: DC

## 2023-07-28 NOTE — Assessment & Plan Note (Signed)
LDL measured 12/23 was 33. Patient has been compliant with Lipitor 80 mg.  -Refilled Lipitor
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
## 2023-07-28 NOTE — Assessment & Plan Note (Signed)
Measured 13.4 at [facility]. -Refilled D3 2000 international units/daily

## 2023-07-28 NOTE — Assessment & Plan Note (Signed)
Patient discharged 7/31 for DKA. This is his second admission for DKA. Patient states he is doing well since discharge and has taken more ownership in his health. See plan for diabetes control.

## 2023-08-04 ENCOUNTER — Encounter: Payer: Self-pay | Admitting: Podiatry

## 2023-08-04 ENCOUNTER — Ambulatory Visit (INDEPENDENT_AMBULATORY_CARE_PROVIDER_SITE_OTHER): Payer: 59 | Admitting: Podiatry

## 2023-08-04 DIAGNOSIS — M21962 Unspecified acquired deformity of left lower leg: Secondary | ICD-10-CM

## 2023-08-04 DIAGNOSIS — L97522 Non-pressure chronic ulcer of other part of left foot with fat layer exposed: Secondary | ICD-10-CM

## 2023-08-04 DIAGNOSIS — M21961 Unspecified acquired deformity of right lower leg: Secondary | ICD-10-CM
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
## 2023-08-13 ENCOUNTER — Other Ambulatory Visit (HOSPITAL_BASED_OUTPATIENT_CLINIC_OR_DEPARTMENT_OTHER): Payer: Self-pay | Admitting: General Practice

## 2023-08-13 ENCOUNTER — Other Ambulatory Visit: Payer: Self-pay | Admitting: Cardiovascular Disease

## 2023-08-16 ENCOUNTER — Ambulatory Visit: Payer: 59 | Admitting: Podiatry
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
## 2023-08-16 NOTE — Progress Notes (Signed)
Internal Medicine Clinic Attending  I was physically present during the key portions of the resident provided service and participated in the medical decision making of patient's management care. I reviewed pertinent patient test results.  The assessment, diagnosis, and plan were formulated together and I agree with the documentation in the resident's note.  Gust Rung, DO

## 2023-08-16 NOTE — Progress Notes (Signed)
Internal Medicine Clinic Attending  I reviewed the AWV findings.  I agree with the assessment, diagnosis, and plan of care documented in the AWV note.     

## 2023-08-17 ENCOUNTER — Ambulatory Visit (INDEPENDENT_AMBULATORY_CARE_PROVIDER_SITE_OTHER): Payer: 59 | Admitting: Neurology

## 2023-08-17 ENCOUNTER — Encounter: Payer: Self-pay | Admitting: Neurology

## 2023-08-17 VITALS — BP 144/80 | Ht 69.0 in | Wt 268.0 lb

## 2023-08-17 DIAGNOSIS — Z794 Long term (current) use of insulin: Secondary | ICD-10-CM

## 2023-08-17 DIAGNOSIS — E1142 Type 2 diabetes mellitus with diabetic polyneuropathy: Secondary | ICD-10-CM | POA: Diagnosis not present

## 2023-08-17 NOTE — Patient Instructions (Addendum)
I will order physical therapy  You can continue the gabapentin  Keep close follow up with your primary care doctor Recommend good management of your diabetes See you back as needed

## 2023-08-22 DIAGNOSIS — R600 Localized edema: Secondary | ICD-10-CM | POA: Diagnosis not present

## 2023-08-22 DIAGNOSIS — I130 Hypertensive heart and chronic kidney disease with heart failure and stage 1 through stage 4 chronic kidney disease, or unspecified chronic kidney disease: Secondary | ICD-10-CM | POA: Diagnosis not present

## 2023-08-22 DIAGNOSIS — Z89411 Acquired absence of right great toe: Secondary | ICD-10-CM | POA: Diagnosis not present

## 2023-08-22 DIAGNOSIS — E1140 Type 2 diabetes mellitus with diabetic neuropathy, unspecified: Secondary | ICD-10-CM | POA: Diagnosis not present

## 2023-08-22 DIAGNOSIS — E7800 Pure hypercholesterolemia, unspecified: Secondary | ICD-10-CM | POA: Diagnosis not present

## 2023-08-22 DIAGNOSIS — E559 Vitamin D deficiency, unspecified: Secondary | ICD-10-CM | POA: Diagnosis not present

## 2023-08-22 DIAGNOSIS — I5042 Chronic combined systolic (congestive) and diastolic (congestive) heart failure: Secondary | ICD-10-CM | POA: Diagnosis not present

## 2023-08-22 DIAGNOSIS — Z89439 Acquired absence of unspecified foot: Secondary | ICD-10-CM | POA: Diagnosis not present

## 2023-08-22 DIAGNOSIS — G8929 Other chronic pain: Secondary | ICD-10-CM | POA: Diagnosis not present

## 2023-08-22 DIAGNOSIS — I2510 Atherosclerotic heart disease of native coronary artery without angina pectoris: Secondary | ICD-10-CM | POA: Diagnosis not present

## 2023-08-22 DIAGNOSIS — Z59819 Housing instability, housed unspecified: Secondary | ICD-10-CM | POA: Diagnosis not present

## 2023-08-22 DIAGNOSIS — N1832 Chronic kidney disease, stage 3b: Secondary | ICD-10-CM | POA: Diagnosis not present

## 2023-08-25 ENCOUNTER — Ambulatory Visit (INDEPENDENT_AMBULATORY_CARE_PROVIDER_SITE_OTHER): Payer: 59 | Admitting: Podiatry

## 2023-08-25 ENCOUNTER — Encounter: Payer: Self-pay | Admitting: Podiatry

## 2023-08-25 DIAGNOSIS — L97522 Non-pressure chronic ulcer of other part of left foot with fat layer exposed: Secondary | ICD-10-CM

## 2023-08-25 DIAGNOSIS — I739 Peripheral vascular disease, unspecified: Secondary | ICD-10-CM

## 2023-08-25 NOTE — Progress Notes (Signed)
  Subjective:  Patient ID: Joshua [name], male    DOB: Dec 26, 1955,  MRN: [id number]  Chief Complaint  Patient presents with   Wound Check    "It's okay but it seems like the place is getting bigger though."    67 y.o. male presents with the above complaint. History confirmed with patient.  He returns for follow-up, he has been using the Prisma, switched to Silvadene when he ran out  Objective:  Physical Exam: warm, good capillary refill, weakly palpable DP and PT pulses, and diffuse peripheral neuropathy, full-thickness ulceration plantar cuboid area measuring 1.5 x 1.6 x 0.5 cm.  Exposed subcutaneous tissue with surrounding hyperkeratosis.  No signs of infection no exposed bone tendon or joint.  Well-healed transmetatarsal rotation on the right foot..     Assessment:   1. Ulcer of left foot with fat layer exposed (HCC)   2. PAD (peripheral artery disease) (HCC)      Plan:  Patient was evaluated and treated and all questions answered.  His diabetic shoes were ready for dispensation today and they were assessed for form fit and function on the right foot he will begin wearing this, hold off on using the left foot until the wound is healed further.  Ulcer left foot -We discussed the etiology and factors that are a part of the wound healing process.  We also discussed the risk of infection both soft tissue and osteomyelitis from open ulceration.  Discussed the risk of limb loss if this happens or worsens. -Debridement as below. -Dressed with Iodosorb, DSD. -Continue home dressing changes  3 times weekly with Prisma and bandage -Continue off-loading with surgical shoe and peg assist device. -I recommended evaluation by vascular surgery considering the recurrence of the wound he has not had follow-up with them since his angiography in December of last year.  Referral was sent. -HgbA1c: 7.8% in May of this year   Procedure: Excisional Debridement of Wound Rationale: Removal of  non-viable soft tissue from the wound to promote healing.  Anesthesia: none Post-Debridement Wound Measurements: Noted above Type of Debridement: Sharp Excisional Tissue Removed: Non-viable soft tissue Depth of Debridement: subcutaneous tissue. Technique: Sharp excisional debridement to bleeding, viable wound base.  Dressing: Dry, sterile, compression dressing. Disposition: Patient tolerated procedure well.      Return in about 3 weeks (around 09/15/2023) for wound care.

## 2023-08-31 ENCOUNTER — Encounter: Payer: Self-pay | Admitting: Internal Medicine

## 2023-08-31 ENCOUNTER — Ambulatory Visit (INDEPENDENT_AMBULATORY_CARE_PROVIDER_SITE_OTHER): Payer: 59 | Admitting: Internal Medicine

## 2023-08-31 VITALS — BP 144/80 | HR 80 | Ht 69.0 in | Wt 266.0 lb

## 2023-08-31 DIAGNOSIS — E785 Hyperlipidemia, unspecified: Secondary | ICD-10-CM | POA: Diagnosis not present

## 2023-08-31 DIAGNOSIS — Z7984 Long term (current) use of oral hypoglycemic drugs: Secondary | ICD-10-CM | POA: Diagnosis not present

## 2023-08-31 DIAGNOSIS — E1165 Type 2 diabetes mellitus with hyperglycemia: Secondary | ICD-10-CM | POA: Diagnosis not present

## 2023-08-31 DIAGNOSIS — Z7985 Long-term (current) use of injectable non-insulin antidiabetic drugs: Secondary | ICD-10-CM | POA: Diagnosis not present

## 2023-08-31 DIAGNOSIS — E1159 Type 2 diabetes mellitus with other circulatory complications: Secondary | ICD-10-CM

## 2023-08-31 DIAGNOSIS — Z794 Long term (current) use of insulin: Secondary | ICD-10-CM | POA: Diagnosis not present

## 2023-08-31 DIAGNOSIS — Z6839 Body mass index (BMI) 39.0-39.9, adult: Secondary | ICD-10-CM

## 2023-08-31 MED ORDER — FREESTYLE LIBRE 3 READER DEVI: 0 refills | Status: AC

## 2023-08-31 MED ORDER — OZEMPIC (1 MG/DOSE) 4 MG/3ML ~~LOC~~ SOPN: 1.0000 mg | PEN_INJECTOR | SUBCUTANEOUS | 3 refills | Status: DC

## 2023-08-31 MED ORDER — FREESTYLE LIBRE 3 PLUS SENSOR MISC: 1.0000 | 3 refills | Status: DC

## 2023-08-31 MED ORDER — LANTUS SOLOSTAR 100 UNIT/ML ~~LOC~~ SOPN: PEN_INJECTOR | SUBCUTANEOUS | Status: DC
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
## 2023-08-31 NOTE — Progress Notes (Unsigned)
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 normal. Pulmonic valve  regurgitation is trivial by color flow Doppler.   Venous: The inferior vena cava is dilated in size with less than 50%  respiratory variability.   Echocardiogram 08/19/21 IMPRESSIONS     1. Left ventricular ejection fraction, by estimation, is 55 to 60%. The  left ventricle has low normal function. Left ventricular endocardial  border not optimally defined to evaluate regional wall motion. There is  mild concentric left ventricular  hypertrophy. Left ventricular diastolic parameters are consistent with  Grade II diastolic dysfunction (pseudonormalization).   2. Right ventricular systolic function is normal. The right ventricular  size is normal. Tricuspid regurgitation signal is inadequate for assessing  PA pressure.   3. Left atrial size was mild to moderately dilated.   4. The mitral valve is normal in structure. No evidence of mitral valve  regurgitation. No evidence of mitral stenosis.   5. The aortic valve was not well visualized. Aortic valve regurgitation  is not visualized. No aortic stenosis is present.   6. The inferior vena cava is normal in size with greater than 50%  respiratory variability, suggesting right atrial  pressure of 3 mmHg.   Comparison(s): No significant change from prior study. Prior images  reviewed side by side.  Echocardiogram 12/09/2022 IMPRESSIONS     1. Left ventricular ejection fraction, by estimation, is 55 to 60%. The  left ventricle has normal function. The left ventricle has no regional  wall motion abnormalities. Left ventricular diastolic parameters were  normal.   2. Right ventricular systolic function is normal. The right ventricular  size is normal.   3. The mitral valve is normal in structure. No evidence of mitral valve  regurgitation. No evidence of mitral stenosis.   4. The aortic valve is normal in structure. Aortic valve regurgitation is  not visualized. No aortic stenosis is present.   5. The inferior vena cava is dilated in size with >50% respiratory  variability, suggesting right atrial pressure of 8 mmHg.   FINDINGS   Left Ventricle: Left ventricular ejection fraction, by estimation, is 55  to 60%. The left ventricle has normal function. The left ventricle has no  regional wall motion abnormalities. The left ventricular internal cavity  size was normal in size. There is   no left ventricular hypertrophy. Left ventricular diastolic parameters  were normal.   Right Ventricle: The right ventricular size is normal. No increase in  right ventricular wall thickness. Right ventricular systolic function is  normal.   Left Atrium: Left atrial size was normal in size.   Right Atrium: Right atrial size was normal in size.  Pericardium: There is no evidence of pericardial effusion.   Mitral Valve: The mitral valve is normal in structure. Mild mitral annular  calcification. No evidence of mitral valve regurgitation. No evidence of  mitral valve stenosis. MV peak gradient, 9.6 mmHg. The mean mitral valve  gradient is 3.0 mmHg.   Tricuspid Valve: The tricuspid valve is normal in structure. Tricuspid  valve regurgitation is not demonstrated. No evidence of  tricuspid  stenosis.   Aortic Valve: The aortic valve is normal in structure. Aortic valve  regurgitation is not visualized. No aortic stenosis is present.   Pulmonic Valve: The pulmonic valve was normal in structure. Pulmonic valve  regurgitation is not visualized. No evidence of pulmonic stenosis.   Aorta: The aortic root is normal in size and structure.   Venous: The inferior vena cava is dilated in size with greater than 50%  respiratory variability, suggesting right atrial pressure of 8 mmHg.   IAS/Shunts: No atrial level shunt detected by color flow Doppler.     EKG: Sinus rhythm with first-degree AV block possible left atrial enlargement minimal voltage criteria for LVH 85 bpm  EKG 3/23 Sinus rhythm with first-degree AV block possible left atrial enlargement 91 bpm no ST or T wave deviation  EKG 09/11/2021 Normal sinus rhythm no ST or T wave deviation 82 bpm  Recent Labs: 07/10/2023: ALT 10 07/11/2023: Magnesium 2.6 07/13/2023: BUN 30; Creatinine, Ser 1.91; Hemoglobin 13.8; Platelets 167; Potassium 4.0; Sodium 131  Recent Lipid Panel    Component Value Date/Time   CHOL 106 11/30/2022 0209   CHOL 283 (H) 08/11/2021 0929   TRIG 151 (H) 11/30/2022 0209   HDL 43 11/30/2022 0209   HDL 89 08/11/2021 0929   CHOLHDL 2.5 11/30/2022 0209   VLDL 30 11/30/2022 0209   LDLCALC 33 11/30/2022 0209   LDLCALC 169 (H) 08/11/2021 0929    ASSESSMENT & PLAN    1.Chronic combined systolic and diastolic CHF-weight today 264 pounds.  Stable chronic dyspnea.  Echo 11/19/2022 showed an EF of 55-60%, normal diastolic parameters.  Echocardiogram 08/19/2021 showed normal LVEF and G2 DD. Continue  Imdur, furosemide,  Continue heart healthy low-sodium diet Increase physical activity as tolerated Lower extremity support stockings Continue Daily weights Maintain weight log Maintain fluid restriction   OSA-has not been using.  CPAP in storage.  Instructed to obtain CPAP from storage clean and  begin use. Continue CPAP use Continue weight loss  Syncope-denies recent episodes.  Change positions slowly.  Cardiac event monitor 12/09/2022 did not result.  Carotid ultrasound 01/18/2023 showed 1-39% ICA bilaterally Maintain hydration Lower extremity support stockings   Coronary artery disease-denies episodes of chest pain in the last 2 weeks.  With cardiac catheterization 4/20 showed widely patent LAD stent, 50% mid circumflex lesion, 40% proximal-mid RCA stenosis, ostial LAD-proximal LAD 90% stenosis.  Underwent scoring balloon angioplasty.  Proximal LAD 90% stenosed. Continue  metoprolol, aspirin, Brilinta, Imdur, Imdur Heart healthy low-sodium diet Maintain physical activity as tolerated   Essential hypertension-BP today 136/88 blood pressure at home 130s over 70s .  Has continued to work with weight loss clinic.   Continue  Imdur,  Heart healthy low-sodium diet Increase physical activity as tolerated Continue blood pressure log  Hyperlipidemia- 11/30/2022: Cholesterol 106; HDL 43; LDL Cholesterol 33; Triglycerides 151; VLDL 30.  Continue aspirin, rosuvastatin Heart healthy low-sodium high-fiber diet Increase physical activity as tolerated Repeat fasting lipids and LFTs 12/24      Disposition: Follow-up with Dr. Tresa Endo  in 6-81months  Medication Adjustments/Labs and Tests Ordered: Current medicines are reviewed at length with the patient today.  Concerns regarding medicines are outlined above.  Orders Placed This Encounter  Procedures   EKG 12-Lead    No orders of the defined types were placed in this encounter.    There are no Patient Instructions on file for this visit.   Signed, Ronney Asters, NP  09/05/2023 11:40 AM       Notice: This dictation was prepared with Dragon dictation along with smaller phrase technology. Any transcriptional errors that result from this process are unintentional and may not be corrected upon review.  I spent 15 minutes  examining this patient, reviewing medications, and using patient centered shared decision making involving her cardiac care.  Prior to her visit I spent greater than 20 minutes reviewing her past medical history,  medications, and prior cardiac tests.

## 2023-08-31 NOTE — Patient Instructions (Addendum)
Please continue: - Lantus 30 units in am and 15 units at night  Change: - Humalog 7-10 units before meals  Restart: - Ozempic 1 mg weekly (for the first week, start at 0.5 mg = 36 clicks on the pen)    Try to start back on the CGM.  Please return in 3 months.

## 2023-09-05 ENCOUNTER — Encounter: Payer: Self-pay | Admitting: General Practice

## 2023-09-05 ENCOUNTER — Ambulatory Visit: Payer: 59 | Attending: General Practice | Admitting: General Practice

## 2023-09-05 VITALS — BP 136/88 | HR 85 | Ht 69.0 in | Wt 264.0 lb

## 2023-09-05 DIAGNOSIS — G4733 Obstructive sleep apnea (adult) (pediatric): Secondary | ICD-10-CM | POA: Diagnosis not present

## 2023-09-05 DIAGNOSIS — I10 Essential (primary) hypertension: Secondary | ICD-10-CM

## 2023-09-05 DIAGNOSIS — R55 Syncope and collapse: Secondary | ICD-10-CM | POA: Diagnosis not present

## 2023-09-05 DIAGNOSIS — I2510 Atherosclerotic heart disease of native coronary artery without angina pectoris: Secondary | ICD-10-CM

## 2023-09-05 DIAGNOSIS — I5042 Chronic combined systolic (congestive) and diastolic (congestive) heart failure: Secondary | ICD-10-CM

## 2023-09-05 DIAGNOSIS — Z9861 Coronary angioplasty status: Secondary | ICD-10-CM

## 2023-09-05 DIAGNOSIS — E785 Hyperlipidemia, unspecified: Secondary | ICD-10-CM

## 2023-09-05 NOTE — Patient Instructions (Signed)
Medication Instructions:  The current medical regimen is effective;  continue present plan and medications as directed. Please refer to the Current Medication list given to you today.  *If you need a refill on your cardiac medications before your next appointment, please call your pharmacy*  Lab Work: NONE If you have labs (blood work) drawn today and your tests are completely normal, you will receive your results only by: MyChart Message (if you have MyChart) OR A paper copy in the mail If you have any lab test that is abnormal or we need to change your treatment, we will call you to review the results.  Other Instructions CLEAN AND START USING YOUR CPAP MACHINE  Follow-Up: At [facility], you and your health needs are our priority.  As part of our continuing mission to provide you with exceptional heart care, we have created designated Provider Care Teams.  These Care Teams include your primary Cardiologist (physician) and Advanced Practice Providers (APPs -  Physician Assistants and Nurse Practitioners) who all work together to provide you with the care you need, when you need it.  Your next appointment:   6-9 month(s)  Provider:   Nicki Guadalajara, MD  or Edd Fabian, FNP

## 2023-09-06 ENCOUNTER — Other Ambulatory Visit: Payer: Self-pay

## 2023-09-06 ENCOUNTER — Encounter: Payer: Self-pay | Admitting: Physical Therapy

## 2023-09-06 ENCOUNTER — Ambulatory Visit: Payer: 59 | Attending: Neurology | Admitting: Physical Therapy

## 2023-09-06 VITALS — BP 156/90 | HR 82

## 2023-09-06 DIAGNOSIS — R2689 Other abnormalities of gait and mobility: Secondary | ICD-10-CM | POA: Diagnosis not present

## 2023-09-06 DIAGNOSIS — R2681 Unsteadiness on feet: Secondary | ICD-10-CM | POA: Insufficient documentation

## 2023-09-06 DIAGNOSIS — R29898 Other symptoms and signs involving the musculoskeletal system: Secondary | ICD-10-CM | POA: Insufficient documentation

## 2023-09-06 NOTE — Therapy (Signed)
OUTPATIENT PHYSICAL THERAPY NEURO EVALUATION   Patient Name: Joshua [name] MRN: [id number] DOB:05-08-1956, 67 y.o., male Today's Date: 09/06/2023   PCP:  Mohammed Kindle, PA REFERRING PROVIDER: Glean Salvo, NP  END OF SESSION:  PT End of Session - 09/06/23 1357     Visit Number 1    Number of Visits 9   including eval   Date for PT Re-Evaluation 10/18/23    Authorization Type [employer]    PT Start Time 1356    PT Stop Time 1444    PT Time Calculation (min) 48 min    Equipment Utilized During Treatment Gait belt    Activity Tolerance Patient tolerated treatment well    Behavior During Therapy WFL for tasks assessed/performed             Past Medical History:  Diagnosis Date   Acute coronary syndrome (HCC)    Anemia    B12 deficiency    Chest pain    CHF (congestive heart failure) (HCC)    Chronic combined systolic and diastolic CHF (congestive heart failure) (HCC)    a. 07/2015 Echo: EF 45-50%, mod LVH, mid apical/antsept AK, Gr 1 DD.   Chronic low back pain    CKD (chronic kidney disease), stage III (HCC)    Constipation    COPD (chronic obstructive pulmonary disease) (HCC)    Coronary artery disease    a. 2012 s/p MI/PCI: s/p DES to mid/dist RCA and overlapping DES to LAD;  b. 07/2015 Cath: LAD 10% ISR, D1 40(jailed), LCX 69m, OM2 30, OM3 30, RCA 16m ISR, 10d ISR; c. 07/2016 NSTEMI/PCI: LAD 85ost/p/m (3.5x20 Synergy DES overlapping prior stent), D1 40, LCX 41m, OM2 95 (2.75x16 Synergy DES), OM3 30, RCA 4m, 10d.   Depression    Diabetic gastroparesis (HCC)    Diabetic polyneuropathy (HCC)    DKA (diabetic ketoacidoses) 03/14/2018   GERD (gastroesophageal reflux disease)    Gout    Heart failure (HCC)    Heart murmur    History of MI (myocardial infarction)    Hyperlipemia    Hypertension    Hypertensive heart disease    Hypokalemia    Ischemic cardiomyopathy    a. 07/2015 Echo: EF 45-50%, mod LVH, mid-apicalanteroseptal AK, Gr1 DD.   Joint  pain    Kidney problem    Morbid obesity (HCC)    OSA on CPAP    Osteoarthritis    Osteomyelitis (HCC)    a. 07/2016 MTP joint of R great toe s/p transmetatarsal amputation.   Other fatigue    Polyneuropathy in diabetes(357.2)    Pulmonary sarcoidosis (HCC)    "problems w/it years ago; no problems anymore" (07/03/2014)   Rectal bleeding    Shortness of breath    Shortness of breath on exertion    Sleep apnea    Stomach ulcer    Swallowing difficulty    Type II diabetes mellitus (HCC)    Past Surgical History:  Procedure Laterality Date   ABDOMINAL AORTOGRAM W/LOWER EXTREMITY N/A 11/29/2022   Procedure: ABDOMINAL AORTOGRAM W/LOWER EXTREMITY;  Surgeon: Maeola Harman, MD;  Location: [facility] INVASIVE CV LAB;  Service: Cardiovascular;  Laterality: N/A;   AMPUTATION Right 07/24/2016   Procedure: TRANSMETATARSAL FOOT AMPUTATION;  Surgeon: Nadara Mustard, MD;  Location: [facility] OR;  Service: Orthopedics;  Laterality: Right;   APPLICATION OF WOUND VAC Left 11/30/2022   Procedure: APPLICATION OF WOUND VAC;  Surgeon: Edwin Cap, DPM;  Location: [facility] OR;  Service:  Podiatry;  Laterality: Left;   BALLOON DILATION N/A 03/21/2018   Procedure: BALLOON DILATION;  Surgeon: Bernette Redbird, MD;  Location: [facility] ENDOSCOPY;  Service: Endoscopy;  Laterality: N/A;   BIOPSY  03/16/2022   Procedure: BIOPSY;  Surgeon: Charlott Rakes, MD;  Location: [facility] ENDOSCOPY;  Service: Endoscopy;;   BREAST LUMPECTOMY Right ~ 2000   "benign"   CARDIAC CATHETERIZATION N/A 07/30/2015   Procedure: Left Heart Cath and Coronary Angiography;  Surgeon: Kathleene Hazel, MD;  Location: [name] INVASIVE CV LAB;  Service: Cardiovascular;  Laterality: N/A;   CARDIAC CATHETERIZATION N/A 07/19/2016   Procedure: Left Heart Cath and Coronary Angiography;  Surgeon: Lyn Records, MD;  Location: [facility] INVASIVE CV LAB;  Service: Cardiovascular;  Laterality: N/A;   CARDIAC CATHETERIZATION N/A 07/29/2016   Procedure: Coronary Stent Intervention;   Surgeon: Lyn Records, MD;  Location: [facility] INVASIVE CV LAB;  Service: Cardiovascular;  Laterality: N/A;   CARDIAC CATHETERIZATION N/A 07/29/2016   Procedure: Intravascular Ultrasound/IVUS;  Surgeon: Lyn Records, MD;  Location: [facility] INVASIVE CV LAB;  Service: Cardiovascular;  Laterality: N/A;   CATARACT EXTRACTION W/ INTRAOCULAR LENS  IMPLANT, BILATERAL Bilateral 2014-2015   COLONOSCOPY WITH PROPOFOL N/A 08/22/2017   Procedure: COLONOSCOPY WITH PROPOFOL;  Surgeon: Charlott Rakes, MD;  Location: [facility] ENDOSCOPY;  Service: Endoscopy;  Laterality: N/A;   COLONOSCOPY WITH PROPOFOL N/A 03/16/2022   Procedure: COLONOSCOPY WITH PROPOFOL;  Surgeon: Charlott Rakes, MD;  Location: [facility] ENDOSCOPY;  Service: Endoscopy;  Laterality: N/A;   CORONARY ANGIOPLASTY WITH STENT PLACEMENT  10/31/2011   30 % MID ATRIOVENTRICULAR GROOVE CX STENOSIS. 90 % MID RCA.PTA TO LAD/STENTING OF TANDEM 60-70%. LAD STENOSIS 99% REDUCED TO 0%.3.0 X PROMUS DES POSTDILATED TO 3.25MM.   CORONARY ANGIOPLASTY WITH STENT PLACEMENT  07/03/2014   "2"   CORONARY STENT INTERVENTION N/A 04/09/2019   Procedure: CORONARY STENT INTERVENTION;  Surgeon: Corky Crafts, MD;  Location: [facility] INVASIVE CV LAB;  Service: Cardiovascular;  Laterality: N/A;   CORONARY STENT INTERVENTION N/A 08/22/2019   Procedure: CORONARY STENT INTERVENTION;  Surgeon: Swaziland, Peter M, MD;  Location: [facility] INVASIVE CV LAB;  Service: Cardiovascular;  Laterality: N/A;   ESOPHAGOGASTRODUODENOSCOPY (EGD) WITH PROPOFOL N/A 03/21/2018   Procedure: ESOPHAGOGASTRODUODENOSCOPY (EGD) WITH PROPOFOL;  Surgeon: Bernette Redbird, MD;  Location: [facility] ENDOSCOPY;  Service: Endoscopy;  Laterality: N/A;   ESOPHAGOGASTRODUODENOSCOPY (EGD) WITH PROPOFOL N/A 03/16/2022   Procedure: ESOPHAGOGASTRODUODENOSCOPY (EGD) WITH PROPOFOL;  Surgeon: Charlott Rakes, MD;  Location: [facility] ENDOSCOPY;  Service: Endoscopy;  Laterality: N/A;   GRAFT APPLICATION Left 11/30/2022   Procedure: GRAFT APPLICATION;  Surgeon:  Edwin Cap, DPM;  Location: [facility] OR;  Service: Podiatry;  Laterality: Left;   I & D EXTREMITY Right 10/30/2013   Procedure: IRRIGATION AND DEBRIDEMENT RIGHT SMALL FINGER POSSIBLE SECONDARY WOUND CLOSURE;  Surgeon: Marlowe Shores, MD;  Location: [facility] ORS;  Service: Orthopedics;  Laterality: Right;  Mina Marble is available after 4pm   I & D EXTREMITY Right 11/01/2013   Procedure:  IRRIGATION AND DEBRIDEMENT RIGHT  PROXIMAL PHALANGEAL LEVEL AMPUTATION;  Surgeon: Marlowe Shores, MD;  Location: [facility] ORS;  Service: Orthopedics;  Laterality: Right;   INCISION AND DRAINAGE Right 10/28/2013   Procedure: INCISION AND DRAINAGE Right small finger;  Surgeon: Marlowe Shores, MD;  Location: [facility] ORS;  Service: Orthopedics;  Laterality: Right;   INCISION AND DRAINAGE OF WOUND Left 11/07/2022   Procedure: IRRIGATION AND DEBRIDEMENT WOUND;  Surgeon: Pilar Plate, DPM;  Location: [facility] OR;  Service: Podiatry;  Laterality:  Left;  Left foot ucler debridement and bone biopsy   LEFT HEART CATH Left 11/03/2011   Procedure: LEFT HEART CATH;  Surgeon: Lennette Bihari, MD;  Location: [facility] CATH LAB;  Service: Cardiovascular;  Laterality: Left;   LEFT HEART CATH AND CORONARY ANGIOGRAPHY N/A 04/09/2019   Procedure: LEFT HEART CATH AND CORONARY ANGIOGRAPHY;  Surgeon: Corky Crafts, MD;  Location: [facility] INVASIVE CV LAB;  Service: Cardiovascular;  Laterality: N/A;   LEFT HEART CATHETERIZATION WITH CORONARY ANGIOGRAM N/A 07/03/2014   Procedure: LEFT HEART CATHETERIZATION WITH CORONARY ANGIOGRAM;  Surgeon: Lesleigh Noe, MD;  Location: [facility] CATH LAB;  Service: Cardiovascular;  Laterality: N/A;   PERCUTANEOUS CORONARY STENT INTERVENTION (PCI-S) Left 11/03/2011   Procedure: PERCUTANEOUS CORONARY STENT INTERVENTION (PCI-S);  Surgeon: Lennette Bihari, MD;  Location: [facility] CATH LAB;  Service: Cardiovascular;  Laterality: Left;   RIGHT/LEFT HEART CATH AND CORONARY ANGIOGRAPHY N/A 08/22/2019   Procedure: RIGHT/LEFT HEART CATH  AND CORONARY ANGIOGRAPHY;  Surgeon: Swaziland, Peter M, MD;  Location: [facility] INVASIVE CV LAB;  Service: Cardiovascular;  Laterality: N/A;   TOE AMPUTATION Right ~ 2010   "2nd toe"   TOE AMPUTATION Right 2012   "3rd toe"   WOUND DEBRIDEMENT Left 11/30/2022   Procedure: DEBRIDEMENT WOUND;  Surgeon: Edwin Cap, DPM;  Location: [facility] OR;  Service: Podiatry;  Laterality: Left;   Patient Active Problem List   Diagnosis Date Noted   Vitamin D deficiency 07/28/2023   DKA (diabetic ketoacidosis) (HCC) 07/10/2023   Pharyngitis 07/10/2023   Lactic acidosis 07/10/2023   SIRS (systemic inflammatory response syndrome) (HCC) 07/10/2023   Elevated troponin 07/10/2023   Diabetic ulcer of left midfoot associated with diabetes mellitus due to underlying condition, with fat layer exposed (HCC) 12/02/2022   Acute osteomyelitis of metatarsal bone of left foot (HCC) 11/30/2022   Left foot pain 11/26/2022   Protein-calorie malnutrition, severe 11/08/2022   Osteomyelitis (HCC) 11/06/2022   Diabetic foot infection (HCC) 11/06/2022   Osteomyelitis of fifth toe of left foot (HCC) 11/05/2022   (HFpEF) heart failure with preserved ejection fraction (HCC) 11/05/2022   CKD (chronic kidney disease), stage IV (HCC) 11/05/2022   Dysphagia 03/16/2022   History of colonic polyps 03/16/2022   Spinal stenosis of lumbar region 11/03/2021   Mixed diabetic hyperlipidemia associated with type 2 diabetes mellitus (HCC) 08/11/2021   Chronic low back pain    Hypokalemia 09/25/2020   Insulin dependent type 2 diabetes mellitus (HCC) 09/24/2020   Unstable angina (HCC) 08/17/2019   Cardiac chest pain 08/16/2019   NSTEMI (non-ST elevated myocardial infarction) (HCC) 04/06/2019   ACS (acute coronary syndrome) (HCC) 04/06/2019   Sepsis (HCC) 03/15/2018   DKA (diabetic ketoacidoses) 03/15/2018   Preop cardiovascular exam 08/22/2017   Acute renal failure superimposed on stage 3b chronic kidney disease (HCC) 05/19/2017   Essential  hypertension    Status post transmetatarsal amputation of foot, right (HCC) 11/08/2016   Diabetic polyneuropathy associated with type 2 diabetes mellitus (HCC) 11/08/2016   Pure hypercholesterolemia    AKI (acute kidney injury) (HCC)    Acute blood loss anemia    DM2 (diabetes mellitus, type 2) (HCC)    Physical debility 08/03/2016   Chronic osteomyelitis of right foot (HCC)    Cellulitis and abscess of leg, except foot    Non-healing wound of left lower extremity    Stage 3b chronic kidney disease (CKD) (HCC)    Unstable angina pectoris (HCC) 07/18/2016   Chronic diastolic CHF (congestive heart failure) (HCC)  Chest pain with moderate risk for cardiac etiology 06/24/2014   CAD S/P multiple PCIs 06/24/2014   Sleep apnea- on C-pap 06/24/2014   Class 3 severe obesity with serious comorbidity and body mass index (BMI) of 45.0 to 49.9 in adult [facility]) 11/10/2013   Hyperglycemia 10/29/2013   Hyperlipidemia 01/12/2011   Benign essential HTN 01/12/2011   GERD (gastroesophageal reflux disease) 01/12/2011   Acute osteomyelitis of hindfoot, left (HCC) 01/12/2011    ONSET DATE: 08/17/2023 (referral date)  REFERRING DIAG: E11.42 (ICD-10-CM) - Diabetic polyneuropathy associated with type 2 diabetes mellitus (HCC)  THERAPY DIAG:  Other abnormalities of gait and mobility - Plan: PT plan of care cert/re-cert  Muscular deconditioning - Plan: PT plan of care cert/re-cert  Unsteadiness on feet - Plan: PT plan of care cert/re-cert  Rationale for Evaluation and Treatment: Rehabilitation  SUBJECTIVE:                                                                                                                                                                                             SUBJECTIVE STATEMENT: Patient reports that he was encouraged to come to PT due to balance deficits. Patient scheduled to see vascular doctor for LLE. Patient to wear post up shoes continuously. Patient reports  history numerous falls over the last 6 months. Patient reports approximately 10 falls within this time frame. Patient reports that he is able to get up by himself. Patient reports he has one major fall that resulted in rib/chest pain that has since resolved. Patient arrives to session with 2WW, patient reports without shoes he is walking without AD. Patient reports that he turns most frequently due to foot turning inward. Patient also transmetatarsal amputation on R side in 2017. Patient has shoe filler for R side to help compensate. Patient reports ongoing falls for years but has progressively gotten worse since last December.   Pt accompanied by: self  PERTINENT HISTORY: nonhealing ulcer of LLE, DMII, diabetic polyneuropathy   PAIN:  Are you having pain? Yes: NPRS scale: 3-2/10 Pain location: L knee into foot Pain description: sharp/achy Aggravating factors: stepping on uneven items Relieving factors: unsure  PRECAUTIONS: Fall and Other: post op shoe on LLE  RED FLAGS: None   WEIGHT BEARING RESTRICTIONS:  Requires post op boot on LLE due to wound  FALLS: Has patient fallen in last 6 months? Yes. Number of falls 10+ see above, foot rolling out primary cause  LIVING ENVIRONMENT: Lives with: lives with their family with son Lives in: Mobile home Stairs: Yes: External: 5-6 steps; can reach both Has following equipment at home: Dan Humphreys - 2 wheeled  PLOF: Independent  PATIENT GOALS: "I just want to improve my balance; I feel like my legs have gotten really weak."   OBJECTIVE:   DIAGNOSTIC FINDINGS:  CT Head wo Contrast 12/08/2022: "IMPRESSION: Negative head CT. No acute intracranial abnormality identified.  COGNITION: Overall cognitive status: Within functional limits for tasks assessed   SENSATION: Documented neuropathy in bilateral LE and UE  COORDINATION: WFL  EDEMA:  LLE swelling near bilateral ankles   MUSCLE TONE: WFL  POSTURE: No Significant postural  limitations  LOWER EXTREMITY ROM:     Reduced L ankle dorsiflexion ~3 degrees  LOWER EXTREMITY MMT:    MMT Right Eval Left Eval  Hip flexion 4-/5 4-/5  Hip extension    Hip abduction    Hip adduction    Hip internal rotation    Hip external rotation    Knee flexion 4/5 4/5  Knee extension 4/5 4/5  Ankle dorsiflexion    Ankle plantarflexion    Ankle inversion    Ankle eversion    (Blank rows = not tested)  BED MOBILITY:  Denies any challenges getting into/out of bed  TRANSFERS: Assistive device utilized: Environmental consultant - 2 wheeled  Sit to stand: SBA Stand to sit: SBA Chair to chair: SBA *Unsteady with stabilization on chair, tends to lose balance backwards  GAIT: Gait pattern: decreased stance time- Left, trunk flexed, wide BOS, and poor foot clearance- Left Distance walked: various clinic distances, ~ 3-4 x 60 feet Assistive device utilized: Walker - 2 wheeled + post op shoe Level of assistance: SBA Comments: unsteady particularly with turns and backing up to surfaces   FUNCTIONAL TESTS:    [facility] PT Assessment - 09/06/23 0001       Standardized Balance Assessment   Standardized Balance Assessment Five Times Sit to Stand;10 meter walk test;Timed Up and Go Test    Five times sit to stand comments  52.15   seconds without use of UE but stabilizing on chair with LE, unbalanced (CGA)   10 Meter Walk 0.51   m/s with 2WW (SBA)     Timed Up and Go Test   Normal TUG (seconds) 19.34   with 2WW unsteady with turns (SBA)            PATIENT SURVEYS:  Not performed   TODAY'S TREATMENT:                                                                                                                               TherAct: Reviewed extensively post op protocol and emphasized patient's safety in home including use of 2WW. Provided patient with tennis balls for 2WW and adjusted walker height ot appropriate height as patient had set to high.   PATIENT EDUCATION: Education  details: POC, goal collaboration, examination findings Person educated: Patient Education method: Explanation Education comprehension: verbalized understanding and needs further education  HOME EXERCISE PROGRAM: To be provided  GOALS: Goals reviewed with patient? Yes  LONG  TERM GOALS: Target date: 10/18/2023 (STG = LTG due to POC length)   Patient will report demonstrate independence with final HEP in order to maintain current gains and continue to progress after physical therapy discharge.   Baseline: To be provided Goal status: INITIAL  2.  Patient will improve gait speed to 0.61 m/s to indicate progress towards the level of community ambulator in order to participate more easily in activities outside of the home.   Baseline: 0.51 m/s with 2WW + post op shoe Goal status: INITIAL  3.  Patient will improve their 5x Sit to Stand score to less than 40 seconds to demonstrate a decreased risk for falls and improved LE strength.   Baseline: 52.15 without UE use, unstable coming to stand Goal status: INITIAL  4.  Patient will improve TUG score to 15 seconds or less to indicate progress towards a decreased risk of falls and demonstrate improved overall mobility.   Baseline: 19.34 with 2WW + post op shoe Goal status: INITIAL  ASSESSMENT:  CLINICAL IMPRESSION: Patient is a 67 y.o. male who was seen today for physical therapy evaluation and treatment for imbalance secondary to diabetic polyneuropathy and R transmetatarsal amputation '17. Patient demonstrates increased risk for falls as indicated by TUG, , and FxSTS. Patient demonstrates poor static standing balance. Patient to wear post op boot for nonhealing LLE wound; patient compliant during session but reports noncompliance with home. Therapist emphasizes compliance and need to do so in order to safely continue therapy. Continue POC.    OBJECTIVE IMPAIRMENTS: Abnormal gait, decreased balance, difficulty walking, decreased ROM,  decreased strength, impaired sensation, and pain.   ACTIVITY LIMITATIONS: bending, squatting, transfers, reach over head, and locomotion level  PARTICIPATION LIMITATIONS: cleaning, community activity, and yard work  PERSONAL FACTORS: Past/current experiences, Time since onset of injury/illness/exacerbation, and 3+ comorbidities: see above  are also affecting patient's functional outcome.   REHAB POTENTIAL: Fair given wound on LLE and noncompliance reported in today's session  CLINICAL DECISION MAKING: Stable/uncomplicated  EVALUATION COMPLEXITY: Low  PLAN:  PT FREQUENCY: 2x/week  PT DURATION: 4 weeks  PLANNED INTERVENTIONS: Therapeutic exercises, Therapeutic activity, Neuromuscular re-education, Balance training, Gait training, Patient/Family education, Self Care, DME instructions, and Re-evaluation  PLAN FOR NEXT SESSION: work on balance reactions (stepping compensatory strategies), review compliance with AD as need, consider trial of cane for in home ambulation, create intiial HEP   Carmelia Bake, PT, DPT 09/06/2023, 3:25 PM

## 2023-09-07 ENCOUNTER — Other Ambulatory Visit: Payer: Self-pay | Admitting: Student

## 2023-09-14 ENCOUNTER — Telehealth: Payer: Self-pay

## 2023-09-14 ENCOUNTER — Other Ambulatory Visit: Payer: Self-pay | Admitting: Student

## 2023-09-14 DIAGNOSIS — Z794 Long term (current) use of insulin: Secondary | ICD-10-CM

## 2023-09-14 MED ORDER — LANTUS SOLOSTAR 100 UNIT/ML ~~LOC~~ SOPN
PEN_INJECTOR | SUBCUTANEOUS | 3 refills | Status: DC
Start: 2023-09-14 — End: 2023-11-25

## 2023-09-14 NOTE — Telephone Encounter (Signed)
Incoming fax from pharmacy Medication: insulin glargine yfgn Message to prescriber: "medication is not on pt's formulary per their insurance they will cover lantus,toujeo and tresiba please advise.

## 2023-09-15 ENCOUNTER — Encounter: Payer: Self-pay | Admitting: Podiatry

## 2023-09-15 ENCOUNTER — Ambulatory Visit: Payer: 59 | Admitting: Podiatry

## 2023-09-15 DIAGNOSIS — L97522 Non-pressure chronic ulcer of other part of left foot with fat layer exposed: Secondary | ICD-10-CM

## 2023-09-15 NOTE — Patient Instructions (Signed)
Call [phone] to schedule your vascular testing:  [facility] [address] [place], [place] [address]

## 2023-09-15 NOTE — Progress Notes (Signed)
Subjective:  Patient ID: Joshua [name], male    DOB: 1956/11/11,  MRN: [id number]  Chief Complaint  Patient presents with   Foot Ulcer    Left midfoot plantar ulcer. Does have some warmth, baseline edema. Ulcer otherwise appears stable. Patient presenting with walker and post op shoe. Changing dressing every 3-4 days.    67 y.o. male presents with the above complaint. History confirmed with patient.  He returns for follow-up, he has been using the Prisma   Objective:  Physical Exam: warm, good capillary refill, weakly palpable DP and PT pulses, and diffuse peripheral neuropathy, full-thickness ulceration plantar cuboid area measuring 1.5 x 1.2 x 0.3 cm.  Exposed subcutaneous tissue with surrounding hyperkeratosis.  No signs of infection no exposed bone tendon or joint.  Well-healed transmetatarsal rotation on the right foot..     Assessment:   1. Ulcer of left foot with fat layer exposed (HCC)       Plan:  Patient was evaluated and treated and all questions answered.  His diabetic shoes were ready for dispensation today and they were assessed for form fit and function on the right foot he will begin wearing this, hold off on using the left foot until the wound is healed further.  Ulcer left foot -We discussed the etiology and factors that are a part of the wound healing process.  We also discussed the risk of infection both soft tissue and osteomyelitis from open ulceration.  Discussed the risk of limb loss if this happens or worsens. -Debridement as below. -Dressed with Iodosorb, DSD. -Continue home dressing changes  3 times weekly with Prisma and bandage -Continue off-loading with surgical shoe and peg assist device. -HgbA1c: 7.8% in May of this year   Procedure: Excisional Debridement of Wound Rationale: Removal of non-viable soft tissue from the wound to promote healing.  Anesthesia: none Post-Debridement Wound Measurements: Noted above Type of Debridement: Sharp  Excisional Tissue Removed: Non-viable soft tissue Depth of Debridement: subcutaneous tissue. Technique: Sharp excisional debridement to bleeding, viable wound base.  Dressing: Dry, sterile, compression dressing. Disposition: Patient tolerated procedure well.      Return in about 3 weeks (around 10/06/2023) for wound care.

## 2023-09-20 ENCOUNTER — Ambulatory Visit: Payer: 59 | Attending: Neurology | Admitting: Physical Therapy

## 2023-09-20 ENCOUNTER — Encounter: Payer: Self-pay | Admitting: Physical Therapy

## 2023-09-20 VITALS — BP 160/92 | HR 89

## 2023-09-20 DIAGNOSIS — R2689 Other abnormalities of gait and mobility: Secondary | ICD-10-CM | POA: Diagnosis not present

## 2023-09-20 DIAGNOSIS — R29898 Other symptoms and signs involving the musculoskeletal system: Secondary | ICD-10-CM | POA: Diagnosis not present

## 2023-09-20 DIAGNOSIS — R2681 Unsteadiness on feet: Secondary | ICD-10-CM | POA: Diagnosis not present

## 2023-09-20 NOTE — Therapy (Signed)
OUTPATIENT PHYSICAL THERAPY NEURO TREATMENT   Patient Name: Joshua [name] MRN: [id number] DOB:1956/12/03, 67 y.o., male Today's Date: 09/20/2023   PCP:  Mohammed Kindle, PA REFERRING PROVIDER: Glean Salvo, NP  END OF SESSION:  PT End of Session - 09/20/23 1235     Visit Number 2    Number of Visits 9    Date for PT Re-Evaluation 10/18/23    Authorization Type [employer]    PT Start Time 1232    PT Stop Time 1312    PT Time Calculation (min) 40 min    Equipment Utilized During Treatment Gait belt    Activity Tolerance Patient tolerated treatment well    Behavior During Therapy WFL for tasks assessed/performed             Past Medical History:  Diagnosis Date   Acute coronary syndrome (HCC)    Anemia    B12 deficiency    Chest pain    CHF (congestive heart failure) (HCC)    Chronic combined systolic and diastolic CHF (congestive heart failure) (HCC)    a. 07/2015 Echo: EF 45-50%, mod LVH, mid apical/antsept AK, Gr 1 DD.   Chronic low back pain    CKD (chronic kidney disease), stage III (HCC)    Constipation    COPD (chronic obstructive pulmonary disease) (HCC)    Coronary artery disease    a. 2012 s/p MI/PCI: s/p DES to mid/dist RCA and overlapping DES to LAD;  b. 07/2015 Cath: LAD 10% ISR, D1 40(jailed), LCX 75m, OM2 30, OM3 30, RCA 79m ISR, 10d ISR; c. 07/2016 NSTEMI/PCI: LAD 85ost/p/m (3.5x20 Synergy DES overlapping prior stent), D1 40, LCX 41m, OM2 95 (2.75x16 Synergy DES), OM3 30, RCA 60m, 10d.   Depression    Diabetic gastroparesis (HCC)    Diabetic polyneuropathy (HCC)    DKA (diabetic ketoacidoses) 03/14/2018   GERD (gastroesophageal reflux disease)    Gout    Heart failure (HCC)    Heart murmur    History of MI (myocardial infarction)    Hyperlipemia    Hypertension    Hypertensive heart disease    Hypokalemia    Ischemic cardiomyopathy    a. 07/2015 Echo: EF 45-50%, mod LVH, mid-apicalanteroseptal AK, Gr1 DD.   Joint pain    Kidney  problem    Morbid obesity (HCC)    OSA on CPAP    Osteoarthritis    Osteomyelitis (HCC)    a. 07/2016 MTP joint of R great toe s/p transmetatarsal amputation.   Other fatigue    Polyneuropathy in diabetes(357.2)    Pulmonary sarcoidosis (HCC)    "problems w/it years ago; no problems anymore" (07/03/2014)   Rectal bleeding    Shortness of breath    Shortness of breath on exertion    Sleep apnea    Stomach ulcer    Swallowing difficulty    Type II diabetes mellitus (HCC)    Past Surgical History:  Procedure Laterality Date   ABDOMINAL AORTOGRAM W/LOWER EXTREMITY N/A 11/29/2022   Procedure: ABDOMINAL AORTOGRAM W/LOWER EXTREMITY;  Surgeon: Maeola Harman, MD;  Location: [facility] INVASIVE CV LAB;  Service: Cardiovascular;  Laterality: N/A;   AMPUTATION Right 07/24/2016   Procedure: TRANSMETATARSAL FOOT AMPUTATION;  Surgeon: Nadara Mustard, MD;  Location: [facility] OR;  Service: Orthopedics;  Laterality: Right;   APPLICATION OF WOUND VAC Left 11/30/2022   Procedure: APPLICATION OF WOUND VAC;  Surgeon: Edwin Cap, DPM;  Location: [facility] OR;  Service: Podiatry;  Laterality:  Left;   BALLOON DILATION N/A 03/21/2018   Procedure: BALLOON DILATION;  Surgeon: Bernette Redbird, MD;  Location: [facility] ENDOSCOPY;  Service: Endoscopy;  Laterality: N/A;   BIOPSY  03/16/2022   Procedure: BIOPSY;  Surgeon: Charlott Rakes, MD;  Location: [facility] ENDOSCOPY;  Service: Endoscopy;;   BREAST LUMPECTOMY Right ~ 2000   "benign"   CARDIAC CATHETERIZATION N/A 07/30/2015   Procedure: Left Heart Cath and Coronary Angiography;  Surgeon: Kathleene Hazel, MD;  Location: [facility] INVASIVE CV LAB;  Service: Cardiovascular;  Laterality: N/A;   CARDIAC CATHETERIZATION N/A 07/19/2016   Procedure: Left Heart Cath and Coronary Angiography;  Surgeon: Lyn Records, MD;  Location: [facility] INVASIVE CV LAB;  Service: Cardiovascular;  Laterality: N/A;   CARDIAC CATHETERIZATION N/A 07/29/2016   Procedure: Coronary Stent Intervention;  Surgeon: Lyn Records, MD;  Location: [facility] INVASIVE CV LAB;  Service: Cardiovascular;  Laterality: N/A;   CARDIAC CATHETERIZATION N/A 07/29/2016   Procedure: Intravascular Ultrasound/IVUS;  Surgeon: Lyn Records, MD;  Location: [facility] INVASIVE CV LAB;  Service: Cardiovascular;  Laterality: N/A;   CATARACT EXTRACTION W/ INTRAOCULAR LENS  IMPLANT, BILATERAL Bilateral 2014-2015   COLONOSCOPY WITH PROPOFOL N/A 08/22/2017   Procedure: COLONOSCOPY WITH PROPOFOL;  Surgeon: Charlott Rakes, MD;  Location: [facility] ENDOSCOPY;  Service: Endoscopy;  Laterality: N/A;   COLONOSCOPY WITH PROPOFOL N/A 03/16/2022   Procedure: COLONOSCOPY WITH PROPOFOL;  Surgeon: Charlott Rakes, MD;  Location: [facility] ENDOSCOPY;  Service: Endoscopy;  Laterality: N/A;   CORONARY ANGIOPLASTY WITH STENT PLACEMENT  10/31/2011   30 % MID ATRIOVENTRICULAR GROOVE CX STENOSIS. 90 % MID RCA.PTA TO LAD/STENTING OF TANDEM 60-70%. LAD STENOSIS 99% REDUCED TO 0%.3.0 X PROMUS DES POSTDILATED TO 3.25MM.   CORONARY ANGIOPLASTY WITH STENT PLACEMENT  07/03/2014   "2"   CORONARY STENT INTERVENTION N/A 04/09/2019   Procedure: CORONARY STENT INTERVENTION;  Surgeon: Corky Crafts, MD;  Location: [facility] INVASIVE CV LAB;  Service: Cardiovascular;  Laterality: N/A;   CORONARY STENT INTERVENTION N/A 08/22/2019   Procedure: CORONARY STENT INTERVENTION;  Surgeon: Swaziland, Peter M, MD;  Location: [facility] INVASIVE CV LAB;  Service: Cardiovascular;  Laterality: N/A;   ESOPHAGOGASTRODUODENOSCOPY (EGD) WITH PROPOFOL N/A 03/21/2018   Procedure: ESOPHAGOGASTRODUODENOSCOPY (EGD) WITH PROPOFOL;  Surgeon: Bernette Redbird, MD;  Location: [facility] ENDOSCOPY;  Service: Endoscopy;  Laterality: N/A;   ESOPHAGOGASTRODUODENOSCOPY (EGD) WITH PROPOFOL N/A 03/16/2022   Procedure: ESOPHAGOGASTRODUODENOSCOPY (EGD) WITH PROPOFOL;  Surgeon: Charlott Rakes, MD;  Location: [facility] ENDOSCOPY;  Service: Endoscopy;  Laterality: N/A;   GRAFT APPLICATION Left 11/30/2022   Procedure: GRAFT APPLICATION;  Surgeon: Edwin Cap,  DPM;  Location: [facility] OR;  Service: Podiatry;  Laterality: Left;   I & D EXTREMITY Right 10/30/2013   Procedure: IRRIGATION AND DEBRIDEMENT RIGHT SMALL FINGER POSSIBLE SECONDARY WOUND CLOSURE;  Surgeon: Marlowe Shores, MD;  Location: [facility] ORS;  Service: Orthopedics;  Laterality: Right;  Mina Marble is available after 4pm   I & D EXTREMITY Right 11/01/2013   Procedure:  IRRIGATION AND DEBRIDEMENT RIGHT  PROXIMAL PHALANGEAL LEVEL AMPUTATION;  Surgeon: Marlowe Shores, MD;  Location: [facility] ORS;  Service: Orthopedics;  Laterality: Right;   INCISION AND DRAINAGE Right 10/28/2013   Procedure: INCISION AND DRAINAGE Right small finger;  Surgeon: Marlowe Shores, MD;  Location: [facility] ORS;  Service: Orthopedics;  Laterality: Right;   INCISION AND DRAINAGE OF WOUND Left 11/07/2022   Procedure: IRRIGATION AND DEBRIDEMENT WOUND;  Surgeon: Pilar Plate, DPM;  Location: [facility] OR;  Service: Podiatry;  Laterality: Left;  Left  foot ucler debridement and bone biopsy   LEFT HEART CATH Left 11/03/2011   Procedure: LEFT HEART CATH;  Surgeon: Lennette Bihari, MD;  Location: [facility] CATH LAB;  Service: Cardiovascular;  Laterality: Left;   LEFT HEART CATH AND CORONARY ANGIOGRAPHY N/A 04/09/2019   Procedure: LEFT HEART CATH AND CORONARY ANGIOGRAPHY;  Surgeon: Corky Crafts, MD;  Location: [facility] INVASIVE CV LAB;  Service: Cardiovascular;  Laterality: N/A;   LEFT HEART CATHETERIZATION WITH CORONARY ANGIOGRAM N/A 07/03/2014   Procedure: LEFT HEART CATHETERIZATION WITH CORONARY ANGIOGRAM;  Surgeon: Lesleigh Noe, MD;  Location: [facility] CATH LAB;  Service: Cardiovascular;  Laterality: N/A;   PERCUTANEOUS CORONARY STENT INTERVENTION (PCI-S) Left 11/03/2011   Procedure: PERCUTANEOUS CORONARY STENT INTERVENTION (PCI-S);  Surgeon: Lennette Bihari, MD;  Location: [facility] CATH LAB;  Service: Cardiovascular;  Laterality: Left;   RIGHT/LEFT HEART CATH AND CORONARY ANGIOGRAPHY N/A 08/22/2019   Procedure: RIGHT/LEFT HEART CATH AND CORONARY  ANGIOGRAPHY;  Surgeon: Swaziland, Peter M, MD;  Location: [facility] INVASIVE CV LAB;  Service: Cardiovascular;  Laterality: N/A;   TOE AMPUTATION Right ~ 2010   "2nd toe"   TOE AMPUTATION Right 2012   "3rd toe"   WOUND DEBRIDEMENT Left 11/30/2022   Procedure: DEBRIDEMENT WOUND;  Surgeon: Edwin Cap, DPM;  Location: [facility] OR;  Service: Podiatry;  Laterality: Left;   Patient Active Problem List   Diagnosis Date Noted   Vitamin D deficiency 07/28/2023   DKA (diabetic ketoacidosis) (HCC) 07/10/2023   Pharyngitis 07/10/2023   Lactic acidosis 07/10/2023   SIRS (systemic inflammatory response syndrome) (HCC) 07/10/2023   Elevated troponin 07/10/2023   Diabetic ulcer of left midfoot associated with diabetes mellitus due to underlying condition, with fat layer exposed (HCC) 12/02/2022   Acute osteomyelitis of metatarsal bone of left foot (HCC) 11/30/2022   Left foot pain 11/26/2022   Protein-calorie malnutrition, severe 11/08/2022   Osteomyelitis (HCC) 11/06/2022   Diabetic foot infection (HCC) 11/06/2022   Osteomyelitis of fifth toe of left foot (HCC) 11/05/2022   (HFpEF) heart failure with preserved ejection fraction (HCC) 11/05/2022   CKD (chronic kidney disease), stage IV (HCC) 11/05/2022   Dysphagia 03/16/2022   History of colonic polyps 03/16/2022   Spinal stenosis of lumbar region 11/03/2021   Mixed diabetic hyperlipidemia associated with type 2 diabetes mellitus (HCC) 08/11/2021   Chronic low back pain    Hypokalemia 09/25/2020   Insulin dependent type 2 diabetes mellitus (HCC) 09/24/2020   Unstable angina (HCC) 08/17/2019   Cardiac chest pain 08/16/2019   NSTEMI (non-ST elevated myocardial infarction) (HCC) 04/06/2019   ACS (acute coronary syndrome) (HCC) 04/06/2019   Sepsis (HCC) 03/15/2018   DKA (diabetic ketoacidosis) (HCC) 03/15/2018   Preop cardiovascular exam 08/22/2017   Acute renal failure superimposed on stage 3b chronic kidney disease (HCC) 05/19/2017   Essential  hypertension    Status post transmetatarsal amputation of foot, right (HCC) 11/08/2016   Diabetic polyneuropathy associated with type 2 diabetes mellitus (HCC) 11/08/2016   Pure hypercholesterolemia    AKI (acute kidney injury) (HCC)    Acute blood loss anemia    DM2 (diabetes mellitus, type 2) (HCC)    Physical debility 08/03/2016   Chronic osteomyelitis of right foot (HCC)    Cellulitis and abscess of leg, except foot    Non-healing wound of left lower extremity    Stage 3b chronic kidney disease (CKD) (HCC)    Unstable angina pectoris (HCC) 07/18/2016   Chronic diastolic CHF (congestive heart failure) (HCC)  Chest pain with moderate risk for cardiac etiology 06/24/2014   CAD S/P multiple PCIs 06/24/2014   Sleep apnea- on C-pap 06/24/2014   Class 3 severe obesity with serious comorbidity and body mass index (BMI) of 45.0 to 49.9 in adult (HCC) 11/10/2013   Hyperglycemia 10/29/2013   Hyperlipidemia 01/12/2011   Benign essential HTN 01/12/2011   GERD (gastroesophageal reflux disease) 01/12/2011   Acute osteomyelitis of hindfoot, left (HCC) 01/12/2011    ONSET DATE: 08/17/2023 (referral date)  REFERRING DIAG: E11.42 (ICD-10-CM) - Diabetic polyneuropathy associated with type 2 diabetes mellitus (HCC)  THERAPY DIAG:  Other abnormalities of gait and mobility  Muscular deconditioning  Unsteadiness on feet  Rationale for Evaluation and Treatment: Rehabilitation  SUBJECTIVE:                                                                                                                                                                                             SUBJECTIVE STATEMENT: Patient reports one falls since last here. His boot got caught up in blankets when standing from up from the edge of his bed; patient fell forward and denies injuries. Was able to get up by self. Received good reports from foot doctor. Patient reports that wearing the boot in the house overall has been  going good. Patient switched boots and states that it has rotated much less. Patient reports 3 bowel accidents and dropping a few things and numbness in hands. Denies cramping and muscle spasms.   Pt accompanied by: self  PERTINENT HISTORY: nonhealing ulcer of LLE, DMII, diabetic polyneuropathy   PAIN:  Are you having pain? Yes: NPRS scale: 2/10 Pain location: both shoulders and L hip in socket Pain description: sharp/achy Aggravating factors: stepping on uneven items Relieving factors: unsure  PRECAUTIONS: Fall and Other: post op shoe on LLE  RED FLAGS: None   WEIGHT BEARING RESTRICTIONS:  Requires post op boot on LLE due to wound  FALLS: Has patient fallen in last 6 months? Yes. Number of falls 10+ see above, foot rolling out primary cause  LIVING ENVIRONMENT: Lives with: lives with their family with son Lives in: Mobile home Stairs: Yes: External: 5-6 steps; can reach both Has following equipment at home: Walker - 2 wheeled  PLOF: Independent  PATIENT GOALS: "I just want to improve my balance; I feel like my legs have gotten really weak."   OBJECTIVE:   DIAGNOSTIC FINDINGS:  CT Head wo Contrast 12/08/2022: "IMPRESSION: Negative head CT. No acute intracranial abnormality identified.  COGNITION: Overall cognitive status: Within functional limits for tasks assessed   TODAY'S TREATMENT:  Vitals:   09/20/23 1246 09/20/23 1247  BP: (!) 168/95 (!) 160/92  Pulse: 89     TherAct: Reviewed BP both automatically and manually; educated on safe BP readings and recommend following up with PCP for management  Gait:  GAIT: Gait pattern: decreased stance time- Left and wide BOS Distance walked: 1 x 230 feet Assistive device utilized: single point cane with quad tip Level of assistance: SBA and CGA Comments: appropriate for short household distances,  improves lateral stability NMR:   Exercises - Standing Step return hovering UE over counter - 10 reps - Bird Dog on Counter for balance -  10 reps - Standing Marching  hovering UE for balance - 10 reps  PATIENT EDUCATION: Education details: Initial HEP + SPC for in home use  Person educated: Patient Education method: Explanation Education comprehension: verbalized understanding and needs further education  HOME EXERCISE PROGRAM:  Access Code: [email or web address] URL: [email or web address] Date: 09/20/2023 Prepared by: Maryruth Eve  Exercises - Standing Hip Abduction with Counter Support  - 1 x daily - 7 x weekly - 3 sets - 10 reps - Bird Dog on Counter  - 1 x daily - 7 x weekly - 3 sets - 10 reps - Standing Marching  - 1 x daily - 7 x weekly - 3 sets - 10 reps  GOALS: Goals reviewed with patient? Yes  LONG TERM GOALS: Target date: 10/18/2023 (STG = LTG due to POC length)   Patient will report demonstrate independence with final HEP in order to maintain current gains and continue to progress after physical therapy discharge.   Baseline: To be provided Goal status: INITIAL  2.  Patient will improve gait speed to 0.61 m/s to indicate progress towards the level of community ambulator in order to participate more easily in activities outside of the home.   Baseline: 0.51 m/s with 2WW + post op shoe Goal status: INITIAL  3.  Patient will improve their 5x Sit to Stand score to less than 40 seconds to demonstrate a decreased risk for falls and improved LE strength.   Baseline: 52.15 without UE use, unstable coming to stand Goal status: INITIAL  4.  Patient will improve TUG score to 15 seconds or less to indicate progress towards a decreased risk of falls and demonstrate improved overall mobility.   Baseline: 19.34 with 2WW + post op shoe Goal status: INITIAL  ASSESSMENT:  CLINICAL IMPRESSION: Skilled physical therapy session emphasized work on Optometrist of initial HEP,  recommendation patient follow up with PCP given accidents and numbness (unclear if related to neuropathy or not), safety with SPC for in home use, and BP safety. Patient tolerated session well. Emphasized stepping strategies on HEP for increased safety in home. Continue POC.    OBJECTIVE IMPAIRMENTS: Abnormal gait, decreased balance, difficulty walking, decreased ROM, decreased strength, impaired sensation, and pain.   ACTIVITY LIMITATIONS: bending, squatting, transfers, reach over head, and locomotion level  PARTICIPATION LIMITATIONS: cleaning, community activity, and yard work  PERSONAL FACTORS: Past/current experiences, Time since onset of injury/illness/exacerbation, and 3+ comorbidities: see above  are also affecting patient's functional outcome.   REHAB POTENTIAL: Fair given wound on LLE and noncompliance reported in today's session  CLINICAL DECISION MAKING: Stable/uncomplicated  EVALUATION COMPLEXITY: Low  PLAN:  PT FREQUENCY: 2x/week  PT DURATION: 4 weeks  PLANNED INTERVENTIONS: Therapeutic exercises, Therapeutic activity, Neuromuscular re-education, Balance training, Gait training, Patient/Family education, Self Care, DME instructions, and Re-evaluation  PLAN FOR NEXT SESSION: work on balance reactions (stepping compensatory strategies),  review compliance with AD as need, did patient get cane for in home ambulation, did patient follow up with PCP about accident, progress stepping and reaching tapping with blaze pods    Carmelia Bake, PT, DPT 09/20/2023, 1:16 PM

## 2023-09-21 DIAGNOSIS — H401131 Primary open-angle glaucoma, bilateral, mild stage: Secondary | ICD-10-CM | POA: Diagnosis not present

## 2023-09-22 ENCOUNTER — Encounter: Payer: Self-pay | Admitting: Physical Therapy

## 2023-09-22 ENCOUNTER — Ambulatory Visit: Payer: 59 | Admitting: Physical Therapy

## 2023-09-22 VITALS — BP 168/98 | HR 83

## 2023-09-22 DIAGNOSIS — R2681 Unsteadiness on feet: Secondary | ICD-10-CM

## 2023-09-22 DIAGNOSIS — R2689 Other abnormalities of gait and mobility: Secondary | ICD-10-CM

## 2023-09-22 DIAGNOSIS — R29898 Other symptoms and signs involving the musculoskeletal system: Secondary | ICD-10-CM

## 2023-09-22 NOTE — Therapy (Signed)
[facility] [address] [place], [place], [address] Phone: [phone]   Fax:  [phone]  Patient Details  Name: Joshua [name] MRN: [id number] Date of Birth: [birth year] Referring Provider:  Gwenevere Abbot, MD  Encounter Date: 09/22/2023  Session was arrive no charge. Patient blood pressure has been trending progressively upward and today at cutoff for therapy with elevated diastolic readings. Given increasing rise in BP recommend patient follow up with PCP for management. Patient verbalized agreement; told to go to ED if became symptomatic.   Vitals:   09/22/23 1108 09/22/23 1111 09/22/23 1115  BP: (!) 174/99 (!) 163/97 (!) 168/98  Pulse: 84 83     Carmelia Bake, PT, DPT 09/22/2023, 11:32 AM  [facility] [address] [place], [place], [address] Phone: [phone]   Fax:  [phone]

## 2023-09-26 DIAGNOSIS — N184 Chronic kidney disease, stage 4 (severe): Secondary | ICD-10-CM | POA: Diagnosis not present

## 2023-09-26 DIAGNOSIS — N183 Chronic kidney disease, stage 3 unspecified: Secondary | ICD-10-CM | POA: Diagnosis not present

## 2023-09-26 DIAGNOSIS — I129 Hypertensive chronic kidney disease with stage 1 through stage 4 chronic kidney disease, or unspecified chronic kidney disease: Secondary | ICD-10-CM | POA: Diagnosis not present

## 2023-09-26 DIAGNOSIS — N2581 Secondary hyperparathyroidism of renal origin: Secondary | ICD-10-CM | POA: Diagnosis not present

## 2023-09-26 DIAGNOSIS — D631 Anemia in chronic kidney disease: Secondary | ICD-10-CM | POA: Diagnosis not present

## 2023-09-26 DIAGNOSIS — I5032 Chronic diastolic (congestive) heart failure: Secondary | ICD-10-CM | POA: Diagnosis not present

## 2023-09-26 DIAGNOSIS — M869 Osteomyelitis, unspecified: Secondary | ICD-10-CM | POA: Diagnosis not present

## 2023-09-27 ENCOUNTER — Ambulatory Visit: Payer: 59 | Admitting: Physical Therapy

## 2023-09-27 ENCOUNTER — Encounter: Payer: Self-pay | Admitting: Physical Therapy

## 2023-09-27 ENCOUNTER — Other Ambulatory Visit: Payer: Self-pay | Admitting: Student

## 2023-09-27 VITALS — BP 161/93 | HR 90

## 2023-09-27 DIAGNOSIS — R29898 Other symptoms and signs involving the musculoskeletal system: Secondary | ICD-10-CM

## 2023-09-27 DIAGNOSIS — R2681 Unsteadiness on feet: Secondary | ICD-10-CM

## 2023-09-27 DIAGNOSIS — R2689 Other abnormalities of gait and mobility: Secondary | ICD-10-CM

## 2023-09-27 NOTE — Telephone Encounter (Signed)
Next appt scheduled 11/14 with Dr Rayvon Char.

## 2023-09-27 NOTE — Therapy (Signed)
OUTPATIENT PHYSICAL THERAPY NEURO TREATMENT   Patient Name: Joshua [name] MRN: [id number] DOB:[birth year], 67 y.o., male Today's Date: 09/27/2023   PCP:  Mohammed Kindle, PA REFERRING PROVIDER: Glean Salvo, NP  END OF SESSION:  PT End of Session - 09/27/23 1330     Visit Number 4    Number of Visits 9    Date for PT Re-Evaluation 10/18/23    Authorization Type [employer]    PT Start Time 1315    PT Stop Time 1403    PT Time Calculation (min) 48 min    Equipment Utilized During Treatment Gait belt    Activity Tolerance Patient tolerated treatment well    Behavior During Therapy WFL for tasks assessed/performed             Past Medical History:  Diagnosis Date   Acute coronary syndrome (HCC)    Anemia    B12 deficiency    Chest pain    CHF (congestive heart failure) (HCC)    Chronic combined systolic and diastolic CHF (congestive heart failure) (HCC)    a. 07/2015 Echo: EF 45-50%, mod LVH, mid apical/antsept AK, Gr 1 DD.   Chronic low back pain    CKD (chronic kidney disease), stage III (HCC)    Constipation    COPD (chronic obstructive pulmonary disease) (HCC)    Coronary artery disease    a. 2012 s/p MI/PCI: s/p DES to mid/dist RCA and overlapping DES to LAD;  b. 07/2015 Cath: LAD 10% ISR, D1 40(jailed), LCX 89m, OM2 30, OM3 30, RCA 76m ISR, 10d ISR; c. 07/2016 NSTEMI/PCI: LAD 85ost/p/m (3.5x20 Synergy DES overlapping prior stent), D1 40, LCX 53m, OM2 95 (2.75x16 Synergy DES), OM3 30, RCA 14m, 10d.   Depression    Diabetic gastroparesis (HCC)    Diabetic polyneuropathy (HCC)    DKA (diabetic ketoacidoses) 03/14/2018   GERD (gastroesophageal reflux disease)    Gout    Heart failure (HCC)    Heart murmur    History of MI (myocardial infarction)    Hyperlipemia    Hypertension    Hypertensive heart disease    Hypokalemia    Ischemic cardiomyopathy    a. 07/2015 Echo: EF 45-50%, mod LVH, mid-apicalanteroseptal AK, Gr1 DD.   Joint pain    Kidney  problem    Morbid obesity (HCC)    OSA on CPAP    Osteoarthritis    Osteomyelitis (HCC)    a. 07/2016 MTP joint of R great toe s/p transmetatarsal amputation.   Other fatigue    Polyneuropathy in diabetes(357.2)    Pulmonary sarcoidosis (HCC)    "problems w/it years ago; no problems anymore" (07/03/2014)   Rectal bleeding    Shortness of breath    Shortness of breath on exertion    Sleep apnea    Stomach ulcer    Swallowing difficulty    Type II diabetes mellitus (HCC)    Past Surgical History:  Procedure Laterality Date   ABDOMINAL AORTOGRAM W/LOWER EXTREMITY N/A 11/29/2022   Procedure: ABDOMINAL AORTOGRAM W/LOWER EXTREMITY;  Surgeon: Maeola Harman, MD;  Location: [facility] INVASIVE CV LAB;  Service: Cardiovascular;  Laterality: N/A;   AMPUTATION Right 07/24/2016   Procedure: TRANSMETATARSAL FOOT AMPUTATION;  Surgeon: Nadara Mustard, MD;  Location: [facility] OR;  Service: Orthopedics;  Laterality: Right;   APPLICATION OF WOUND VAC Left 11/30/2022   Procedure: APPLICATION OF WOUND VAC;  Surgeon: Edwin Cap, DPM;  Location: [facility] OR;  Service: Podiatry;  Laterality:  Left;   BALLOON DILATION N/A 03/21/2018   Procedure: BALLOON DILATION;  Surgeon: Bernette Redbird, MD;  Location: [facility] ENDOSCOPY;  Service: Endoscopy;  Laterality: N/A;   BIOPSY  03/16/2022   Procedure: BIOPSY;  Surgeon: Charlott Rakes, MD;  Location: [facility] ENDOSCOPY;  Service: Endoscopy;;   BREAST LUMPECTOMY Right ~ 2000   "benign"   CARDIAC CATHETERIZATION N/A 07/30/2015   Procedure: Left Heart Cath and Coronary Angiography;  Surgeon: Kathleene Hazel, MD;  Location: [facility] INVASIVE CV LAB;  Service: Cardiovascular;  Laterality: N/A;   CARDIAC CATHETERIZATION N/A 07/19/2016   Procedure: Left Heart Cath and Coronary Angiography;  Surgeon: Lyn Records, MD;  Location: [facility] INVASIVE CV LAB;  Service: Cardiovascular;  Laterality: N/A;   CARDIAC CATHETERIZATION N/A 07/29/2016   Procedure: Coronary Stent Intervention;  Surgeon: Lyn Records, MD;  Location: [facility] INVASIVE CV LAB;  Service: Cardiovascular;  Laterality: N/A;   CARDIAC CATHETERIZATION N/A 07/29/2016   Procedure: Intravascular Ultrasound/IVUS;  Surgeon: Lyn Records, MD;  Location: [facility] INVASIVE CV LAB;  Service: Cardiovascular;  Laterality: N/A;   CATARACT EXTRACTION W/ INTRAOCULAR LENS  IMPLANT, BILATERAL Bilateral 2014-2015   COLONOSCOPY WITH PROPOFOL N/A 08/22/2017   Procedure: COLONOSCOPY WITH PROPOFOL;  Surgeon: Charlott Rakes, MD;  Location: [facility] ENDOSCOPY;  Service: Endoscopy;  Laterality: N/A;   COLONOSCOPY WITH PROPOFOL N/A 03/16/2022   Procedure: COLONOSCOPY WITH PROPOFOL;  Surgeon: Charlott Rakes, MD;  Location: [facility] ENDOSCOPY;  Service: Endoscopy;  Laterality: N/A;   CORONARY ANGIOPLASTY WITH STENT PLACEMENT  10/31/2011   30 % MID ATRIOVENTRICULAR GROOVE CX STENOSIS. 90 % MID RCA.PTA TO LAD/STENTING OF TANDEM 60-70%. LAD STENOSIS 99% REDUCED TO 0%.3.0 X PROMUS DES POSTDILATED TO 3.25MM.   CORONARY ANGIOPLASTY WITH STENT PLACEMENT  07/03/2014   "2"   CORONARY STENT INTERVENTION N/A 04/09/2019   Procedure: CORONARY STENT INTERVENTION;  Surgeon: Corky Crafts, MD;  Location: [facility] INVASIVE CV LAB;  Service: Cardiovascular;  Laterality: N/A;   CORONARY STENT INTERVENTION N/A 08/22/2019   Procedure: CORONARY STENT INTERVENTION;  Surgeon: Swaziland, Peter M, MD;  Location: [facility] INVASIVE CV LAB;  Service: Cardiovascular;  Laterality: N/A;   ESOPHAGOGASTRODUODENOSCOPY (EGD) WITH PROPOFOL N/A 03/21/2018   Procedure: ESOPHAGOGASTRODUODENOSCOPY (EGD) WITH PROPOFOL;  Surgeon: Bernette Redbird, MD;  Location: [facility] ENDOSCOPY;  Service: Endoscopy;  Laterality: N/A;   ESOPHAGOGASTRODUODENOSCOPY (EGD) WITH PROPOFOL N/A 03/16/2022   Procedure: ESOPHAGOGASTRODUODENOSCOPY (EGD) WITH PROPOFOL;  Surgeon: Charlott Rakes, MD;  Location: [facility] ENDOSCOPY;  Service: Endoscopy;  Laterality: N/A;   GRAFT APPLICATION Left 11/30/2022   Procedure: GRAFT APPLICATION;  Surgeon: Edwin Cap,  DPM;  Location: [facility] OR;  Service: Podiatry;  Laterality: Left;   I & D EXTREMITY Right 10/30/2013   Procedure: IRRIGATION AND DEBRIDEMENT RIGHT SMALL FINGER POSSIBLE SECONDARY WOUND CLOSURE;  Surgeon: Marlowe Shores, MD;  Location: [facility] ORS;  Service: Orthopedics;  Laterality: Right;  Mina Marble is available after 4pm   I & D EXTREMITY Right 11/01/2013   Procedure:  IRRIGATION AND DEBRIDEMENT RIGHT  PROXIMAL PHALANGEAL LEVEL AMPUTATION;  Surgeon: Marlowe Shores, MD;  Location: [facility] ORS;  Service: Orthopedics;  Laterality: Right;   INCISION AND DRAINAGE Right 10/28/2013   Procedure: INCISION AND DRAINAGE Right small finger;  Surgeon: Marlowe Shores, MD;  Location: [facility] ORS;  Service: Orthopedics;  Laterality: Right;   INCISION AND DRAINAGE OF WOUND Left 11/07/2022   Procedure: IRRIGATION AND DEBRIDEMENT WOUND;  Surgeon: Pilar Plate, DPM;  Location: [facility] OR;  Service: Podiatry;  Laterality: Left;  Left  foot ucler debridement and bone biopsy   LEFT HEART CATH Left 11/03/2011   Procedure: LEFT HEART CATH;  Surgeon: Lennette Bihari, MD;  Location: [facility] CATH LAB;  Service: Cardiovascular;  Laterality: Left;   LEFT HEART CATH AND CORONARY ANGIOGRAPHY N/A 04/09/2019   Procedure: LEFT HEART CATH AND CORONARY ANGIOGRAPHY;  Surgeon: Corky Crafts, MD;  Location: [facility] INVASIVE CV LAB;  Service: Cardiovascular;  Laterality: N/A;   LEFT HEART CATHETERIZATION WITH CORONARY ANGIOGRAM N/A 07/03/2014   Procedure: LEFT HEART CATHETERIZATION WITH CORONARY ANGIOGRAM;  Surgeon: Lesleigh Noe, MD;  Location: [facility] CATH LAB;  Service: Cardiovascular;  Laterality: N/A;   PERCUTANEOUS CORONARY STENT INTERVENTION (PCI-S) Left 11/03/2011   Procedure: PERCUTANEOUS CORONARY STENT INTERVENTION (PCI-S);  Surgeon: Lennette Bihari, MD;  Location: [facility] CATH LAB;  Service: Cardiovascular;  Laterality: Left;   RIGHT/LEFT HEART CATH AND CORONARY ANGIOGRAPHY N/A 08/22/2019   Procedure: RIGHT/LEFT HEART CATH AND CORONARY  ANGIOGRAPHY;  Surgeon: Swaziland, Peter M, MD;  Location: [facility] INVASIVE CV LAB;  Service: Cardiovascular;  Laterality: N/A;   TOE AMPUTATION Right ~ 2010   "2nd toe"   TOE AMPUTATION Right 2012   "3rd toe"   WOUND DEBRIDEMENT Left 11/30/2022   Procedure: DEBRIDEMENT WOUND;  Surgeon: Edwin Cap, DPM;  Location: [facility] OR;  Service: Podiatry;  Laterality: Left;   Patient Active Problem List   Diagnosis Date Noted   Vitamin D deficiency 07/28/2023   DKA (diabetic ketoacidosis) (HCC) 07/10/2023   Pharyngitis 07/10/2023   Lactic acidosis 07/10/2023   SIRS (systemic inflammatory response syndrome) (HCC) 07/10/2023   Elevated troponin 07/10/2023   Diabetic ulcer of left midfoot associated with diabetes mellitus due to underlying condition, with fat layer exposed (HCC) 12/02/2022   Acute osteomyelitis of metatarsal bone of left foot (HCC) 11/30/2022   Left foot pain 11/26/2022   Protein-calorie malnutrition, severe 11/08/2022   Osteomyelitis (HCC) 11/06/2022   Diabetic foot infection (HCC) 11/06/2022   Osteomyelitis of fifth toe of left foot (HCC) 11/05/2022   (HFpEF) heart failure with preserved ejection fraction (HCC) 11/05/2022   CKD (chronic kidney disease), stage IV (HCC) 11/05/2022   Dysphagia 03/16/2022   History of colonic polyps 03/16/2022   Spinal stenosis of lumbar region 11/03/2021   Mixed diabetic hyperlipidemia associated with type 2 diabetes mellitus (HCC) 08/11/2021   Chronic low back pain    Hypokalemia 09/25/2020   Insulin dependent type 2 diabetes mellitus (HCC) 09/24/2020   Unstable angina (HCC) 08/17/2019   Cardiac chest pain 08/16/2019   NSTEMI (non-ST elevated myocardial infarction) (HCC) 04/06/2019   ACS (acute coronary syndrome) (HCC) 04/06/2019   Sepsis (HCC) 03/15/2018   DKA (diabetic ketoacidosis) (HCC) 03/15/2018   Preop cardiovascular exam 08/22/2017   Acute renal failure superimposed on stage 3b chronic kidney disease (HCC) 05/19/2017   Essential  hypertension    Status post transmetatarsal amputation of foot, right (HCC) 11/08/2016   Diabetic polyneuropathy associated with type 2 diabetes mellitus (HCC) 11/08/2016   Pure hypercholesterolemia    AKI (acute kidney injury) (HCC)    Acute blood loss anemia    DM2 (diabetes mellitus, type 2) (HCC)    Physical debility 08/03/2016   Chronic osteomyelitis of right foot (HCC)    Cellulitis and abscess of leg, except foot    Non-healing wound of left lower extremity    Stage 3b chronic kidney disease (CKD) (HCC)    Unstable angina pectoris (HCC) 07/18/2016   Chronic diastolic CHF (congestive heart failure) (HCC)  Chest pain with moderate risk for cardiac etiology 06/24/2014   CAD S/P multiple PCIs 06/24/2014   Sleep apnea- on C-pap 06/24/2014   Class 3 severe obesity with serious comorbidity and body mass index (BMI) of 45.0 to 49.9 in adult (HCC) 11/10/2013   Hyperglycemia 10/29/2013   Hyperlipidemia 01/12/2011   Benign essential HTN 01/12/2011   GERD (gastroesophageal reflux disease) 01/12/2011   Acute osteomyelitis of hindfoot, left (HCC) 01/12/2011    ONSET DATE: 08/17/2023 (referral date)  REFERRING DIAG: E11.42 (ICD-10-CM) - Diabetic polyneuropathy associated with type 2 diabetes mellitus (HCC)  THERAPY DIAG:  Other abnormalities of gait and mobility  Muscular deconditioning  Unsteadiness on feet  Rationale for Evaluation and Treatment: Rehabilitation  SUBJECTIVE:                                                                                                                                                                                             SUBJECTIVE STATEMENT: Patient denies recent falls or acute changes.  He states he does not understand why his BP has been up and down in the doctor's office as he is only recently experiencing this.  He is wearing left offloading boot and will follow-up with podiatry on 10/21 to assess closing of ulcer.  His most recent blood  sugar is 139, but his sensor came off after his shower today.  He does not have the funds to obtain a cane for use yet.  He states he sees his PCP tomorrow and plans to follow-up about recent medications changes, BP issues, and fall prior.    Pt accompanied by: self  PERTINENT HISTORY: nonhealing ulcer of LLE, DMII, diabetic polyneuropathy   PAIN:  Are you having pain? No  PRECAUTIONS: Fall and Other: post op shoe on LLE  RED FLAGS: None   WEIGHT BEARING RESTRICTIONS:  Requires post op boot on LLE due to wound  FALLS: Has patient fallen in last 6 months? Yes. Number of falls 10+ see above, foot rolling out primary cause  LIVING ENVIRONMENT: Lives with: lives with their family with son Lives in: Mobile home Stairs: Yes: External: 5-6 steps; can reach both Has following equipment at home: Walker - 2 wheeled  PLOF: Independent  PATIENT GOALS: "I just want to improve my balance; I feel like my legs have gotten really weak."   OBJECTIVE:   DIAGNOSTIC FINDINGS:  CT Head wo Contrast 12/08/2022: "IMPRESSION: Negative head CT. No acute intracranial abnormality identified.  COGNITION: Overall cognitive status: Within functional limits for tasks assessed   TODAY'S TREATMENT:  Vitals:   09/27/23 1321 09/27/23 1326  BP: (!) 161/99 (!) 161/93  Pulse: 90 90  6 Blaze pods on random one color taps setting for improved hip strength, dynamic SLS, and weight shifting.  Performed on 1 minute intervals with 20 seconds rest periods.  Pt requires SBA-CGA guarding. Round 1:  Linear lateral stepping setup.  13 hits. Round 2:  " setup.  10 hits. Round 3:  " setup.  11 hits. Notable errors/deficits:  Pt has one instance of posterior LOB requiring minA to recover due to offloading shoe and turning body with poor left foot placement. 6 Blaze pods on random one color taps  setting for improved dynamic SLS and weight shifting.  Performed on 1 minute intervals with 20 seconds rest periods.  Pt requires SBA-CGA guarding. Round 1:  Fwd/backward stepping linear RLE in stance setup.  9 hits. Round 2:  Fwd/backward stepping linear LLE in stance setup.  15 hits. Round 3:  Fwd/backward stepping linear RLE in stance setup.  12 hits. Round 4:  Fwd/backward stepping linear LLE in stance setup.  13 hits. Notable errors/deficits:  Offloading shoe impacting mechanics of retro-stepping.  -Pt expresses fear of infection causing BP fluctuation so pt and PT discuss flag s/s of possible infection w/ pt endorsing nothing of that sort though he is concerned for recent feeling of coldness (does not feel this is chills).  Encouraged further discussion with PCP at visit tomorrow. -Pt requesting BP reassessment at end of session:  167/101, 96 bpm  PATIENT EDUCATION: Education details: Continue HEP + trying to obtain [facility] for in home use, PCP follow-up and discussion of BP and wound infection concerns. Person educated: Patient Education method: Explanation Education comprehension: verbalized understanding and needs further education  HOME EXERCISE PROGRAM:  Access Code: [email or web address] URL: [email or web address] Date: 09/20/2023 Prepared by: Maryruth Eve  Exercises - Standing Hip Abduction with Counter Support  - 1 x daily - 7 x weekly - 3 sets - 10 reps - Bird Dog on Counter  - 1 x daily - 7 x weekly - 3 sets - 10 reps - Standing Marching  - 1 x daily - 7 x weekly - 3 sets - 10 reps  GOALS: Goals reviewed with patient? Yes  LONG TERM GOALS: Target date: 10/18/2023 (STG = LTG due to POC length)   Patient will report demonstrate independence with final HEP in order to maintain current gains and continue to progress after physical therapy discharge.   Baseline: To be provided Goal status: INITIAL  2.  Patient will improve gait speed to 0.61 m/s to indicate progress  towards the level of community ambulator in order to participate more easily in activities outside of the home.   Baseline: 0.51 m/s with 2WW + post op shoe Goal status: INITIAL  3.  Patient will improve their 5x Sit to Stand score to less than 40 seconds to demonstrate a decreased risk for falls and improved LE strength.   Baseline: 52.15 without UE use, unstable coming to stand Goal status: INITIAL  4.  Patient will improve TUG score to 15 seconds or less to indicate progress towards a decreased risk of falls and demonstrate improved overall mobility.   Baseline: 19.34 with 2WW + post op shoe Goal status: INITIAL  ASSESSMENT:  CLINICAL IMPRESSION: Focus of skilled session today on addressing dynamic SLS and weight shifting.  Pt is moderately challenged by blaze pod task requiring some UE support throughout.  He is limited in safe ability to  practice stepping strategy and other dynamic balance tasks due to offloading shoe impacting mechanics and mobility of left foot and ankle.  He continues to benefit from skilled PT intervention to promote improved ambulatory mechanics and dynamic stability as he transitions out of the boot with wound healing.   OBJECTIVE IMPAIRMENTS: Abnormal gait, decreased balance, difficulty walking, decreased ROM, decreased strength, impaired sensation, and pain.   ACTIVITY LIMITATIONS: bending, squatting, transfers, reach over head, and locomotion level  PARTICIPATION LIMITATIONS: cleaning, community activity, and yard work  PERSONAL FACTORS: Past/current experiences, Time since onset of injury/illness/exacerbation, and 3+ comorbidities: see above  are also affecting patient's functional outcome.   REHAB POTENTIAL: Fair given wound on LLE and noncompliance reported in today's session  CLINICAL DECISION MAKING: Stable/uncomplicated  EVALUATION COMPLEXITY: Low  PLAN:  PT FREQUENCY: 2x/week  PT DURATION: 4 weeks  PLANNED INTERVENTIONS: Therapeutic  exercises, Therapeutic activity, Neuromuscular re-education, Balance training, Gait training, Patient/Family education, Self Care, DME instructions, and Re-evaluation  PLAN FOR NEXT SESSION: work on balance reactions (stepping compensatory strategies), review compliance with AD as need, did patient get cane for in home ambulation, did patient follow up with PCP about accident, progress stepping and reaching tapping with blaze pods    Sadie Haber, PT, DPT 09/27/2023, 2:09 PM

## 2023-09-28 ENCOUNTER — Telehealth: Payer: Self-pay

## 2023-09-28 DIAGNOSIS — L97424 Non-pressure chronic ulcer of left heel and midfoot with necrosis of bone: Secondary | ICD-10-CM | POA: Diagnosis not present

## 2023-09-28 DIAGNOSIS — I10 Essential (primary) hypertension: Secondary | ICD-10-CM | POA: Diagnosis not present

## 2023-09-28 NOTE — Progress Notes (Signed)
Care Guide Note  09/28/2023 Name: Joshua [name] MRN: [id number] DOB: 01/10/1956  Referred by: Gwenevere Abbot, MD Reason for referral : Care Coordination (Outreach to schedule with Pharm d )   Joshua [name] is a 67 y.o. year old male who is a primary care patient of Gwenevere Abbot, MD. Joshua [name] was referred to the pharmacist for assistance related to HTN.    An unsuccessful telephone outreach was attempted today to contact the patient who was referred to the pharmacy team for assistance with medication management. Additional attempts will be made to contact the patient.   Penne Lash, RMA Care Guide [facility]  [place], [place] [address] Direct Dial: [phone] [name]

## 2023-09-29 ENCOUNTER — Encounter: Payer: Self-pay | Admitting: Physical Therapy

## 2023-09-29 ENCOUNTER — Ambulatory Visit: Payer: 59 | Admitting: Physical Therapy

## 2023-09-29 VITALS — BP 160/92 | HR 87

## 2023-09-29 DIAGNOSIS — R29898 Other symptoms and signs involving the musculoskeletal system: Secondary | ICD-10-CM

## 2023-09-29 DIAGNOSIS — R2689 Other abnormalities of gait and mobility: Secondary | ICD-10-CM

## 2023-09-29 DIAGNOSIS — R2681 Unsteadiness on feet: Secondary | ICD-10-CM

## 2023-09-29 NOTE — Therapy (Signed)
OUTPATIENT PHYSICAL THERAPY NEURO TREATMENT   Patient Name: Joshua [name] MRN: [id number] DOB:04/30/1956, 67 y.o., male Today's Date: 09/29/2023   PCP:  Mohammed Kindle, PA REFERRING PROVIDER: Glean Salvo, NP  END OF SESSION:  PT End of Session - 09/29/23 1233     Visit Number 5    Number of Visits 9    Date for PT Re-Evaluation 10/18/23    Authorization Type [employer]    PT Start Time 1230    PT Stop Time 1313    PT Time Calculation (min) 43 min    Equipment Utilized During Treatment Gait belt    Activity Tolerance Patient tolerated treatment well    Behavior During Therapy WFL for tasks assessed/performed             Past Medical History:  Diagnosis Date   Acute coronary syndrome (HCC)    Anemia    B12 deficiency    Chest pain    CHF (congestive heart failure) (HCC)    Chronic combined systolic and diastolic CHF (congestive heart failure) (HCC)    a. 07/2015 Echo: EF 45-50%, mod LVH, mid apical/antsept AK, Gr 1 DD.   Chronic low back pain    CKD (chronic kidney disease), stage III (HCC)    Constipation    COPD (chronic obstructive pulmonary disease) (HCC)    Coronary artery disease    a. 2012 s/p MI/PCI: s/p DES to mid/dist RCA and overlapping DES to LAD;  b. 07/2015 Cath: LAD 10% ISR, D1 40(jailed), LCX 82m, OM2 30, OM3 30, RCA 28m ISR, 10d ISR; c. 07/2016 NSTEMI/PCI: LAD 85ost/p/m (3.5x20 Synergy DES overlapping prior stent), D1 40, LCX 16m, OM2 95 (2.75x16 Synergy DES), OM3 30, RCA 59m, 10d.   Depression    Diabetic gastroparesis (HCC)    Diabetic polyneuropathy (HCC)    DKA (diabetic ketoacidoses) 03/14/2018   GERD (gastroesophageal reflux disease)    Gout    Heart failure (HCC)    Heart murmur    History of MI (myocardial infarction)    Hyperlipemia    Hypertension    Hypertensive heart disease    Hypokalemia    Ischemic cardiomyopathy    a. 07/2015 Echo: EF 45-50%, mod LVH, mid-apicalanteroseptal AK, Gr1 DD.   Joint pain    Kidney  problem    Morbid obesity (HCC)    OSA on CPAP    Osteoarthritis    Osteomyelitis (HCC)    a. 07/2016 MTP joint of R great toe s/p transmetatarsal amputation.   Other fatigue    Polyneuropathy in diabetes(357.2)    Pulmonary sarcoidosis (HCC)    "problems w/it years ago; no problems anymore" (07/03/2014)   Rectal bleeding    Shortness of breath    Shortness of breath on exertion    Sleep apnea    Stomach ulcer    Swallowing difficulty    Type II diabetes mellitus (HCC)    Past Surgical History:  Procedure Laterality Date   ABDOMINAL AORTOGRAM W/LOWER EXTREMITY N/A 11/29/2022   Procedure: ABDOMINAL AORTOGRAM W/LOWER EXTREMITY;  Surgeon: Maeola Harman, MD;  Location: [facility] INVASIVE CV LAB;  Service: Cardiovascular;  Laterality: N/A;   AMPUTATION Right 07/24/2016   Procedure: TRANSMETATARSAL FOOT AMPUTATION;  Surgeon: Nadara Mustard, MD;  Location: [facility] OR;  Service: Orthopedics;  Laterality: Right;   APPLICATION OF WOUND VAC Left 11/30/2022   Procedure: APPLICATION OF WOUND VAC;  Surgeon: Edwin Cap, DPM;  Location: [facility] OR;  Service: Podiatry;  Laterality:  Left;   BALLOON DILATION N/A 03/21/2018   Procedure: BALLOON DILATION;  Surgeon: Bernette Redbird, MD;  Location: [facility] ENDOSCOPY;  Service: Endoscopy;  Laterality: N/A;   BIOPSY  03/16/2022   Procedure: BIOPSY;  Surgeon: Charlott Rakes, MD;  Location: [facility] ENDOSCOPY;  Service: Endoscopy;;   BREAST LUMPECTOMY Right ~ 2000   "benign"   CARDIAC CATHETERIZATION N/A 07/30/2015   Procedure: Left Heart Cath and Coronary Angiography;  Surgeon: Kathleene Hazel, MD;  Location: [facility] INVASIVE CV LAB;  Service: Cardiovascular;  Laterality: N/A;   CARDIAC CATHETERIZATION N/A 07/19/2016   Procedure: Left Heart Cath and Coronary Angiography;  Surgeon: Lyn Records, MD;  Location: [facility] INVASIVE CV LAB;  Service: Cardiovascular;  Laterality: N/A;   CARDIAC CATHETERIZATION N/A 07/29/2016   Procedure: Coronary Stent Intervention;  Surgeon: Lyn Records, MD;  Location: [facility] INVASIVE CV LAB;  Service: Cardiovascular;  Laterality: N/A;   CARDIAC CATHETERIZATION N/A 07/29/2016   Procedure: Intravascular Ultrasound/IVUS;  Surgeon: Lyn Records, MD;  Location: [facility] INVASIVE CV LAB;  Service: Cardiovascular;  Laterality: N/A;   CATARACT EXTRACTION W/ INTRAOCULAR LENS  IMPLANT, BILATERAL Bilateral 2014-2015   COLONOSCOPY WITH PROPOFOL N/A 08/22/2017   Procedure: COLONOSCOPY WITH PROPOFOL;  Surgeon: Charlott Rakes, MD;  Location: [facility] ENDOSCOPY;  Service: Endoscopy;  Laterality: N/A;   COLONOSCOPY WITH PROPOFOL N/A 03/16/2022   Procedure: COLONOSCOPY WITH PROPOFOL;  Surgeon: Charlott Rakes, MD;  Location: [facility] ENDOSCOPY;  Service: Endoscopy;  Laterality: N/A;   CORONARY ANGIOPLASTY WITH STENT PLACEMENT  10/31/2011   30 % MID ATRIOVENTRICULAR GROOVE CX STENOSIS. 90 % MID RCA.PTA TO LAD/STENTING OF TANDEM 60-70%. LAD STENOSIS 99% REDUCED TO 0%.3.0 X PROMUS DES POSTDILATED TO 3.25MM.   CORONARY ANGIOPLASTY WITH STENT PLACEMENT  07/03/2014   "2"   CORONARY STENT INTERVENTION N/A 04/09/2019   Procedure: CORONARY STENT INTERVENTION;  Surgeon: Corky Crafts, MD;  Location: [facility] INVASIVE CV LAB;  Service: Cardiovascular;  Laterality: N/A;   CORONARY STENT INTERVENTION N/A 08/22/2019   Procedure: CORONARY STENT INTERVENTION;  Surgeon: Swaziland, Peter M, MD;  Location: [facility] INVASIVE CV LAB;  Service: Cardiovascular;  Laterality: N/A;   ESOPHAGOGASTRODUODENOSCOPY (EGD) WITH PROPOFOL N/A 03/21/2018   Procedure: ESOPHAGOGASTRODUODENOSCOPY (EGD) WITH PROPOFOL;  Surgeon: Bernette Redbird, MD;  Location: [facility] ENDOSCOPY;  Service: Endoscopy;  Laterality: N/A;   ESOPHAGOGASTRODUODENOSCOPY (EGD) WITH PROPOFOL N/A 03/16/2022   Procedure: ESOPHAGOGASTRODUODENOSCOPY (EGD) WITH PROPOFOL;  Surgeon: Charlott Rakes, MD;  Location: [facility] ENDOSCOPY;  Service: Endoscopy;  Laterality: N/A;   GRAFT APPLICATION Left 11/30/2022   Procedure: GRAFT APPLICATION;  Surgeon: Edwin Cap,  DPM;  Location: [facility] OR;  Service: Podiatry;  Laterality: Left;   I & D EXTREMITY Right 10/30/2013   Procedure: IRRIGATION AND DEBRIDEMENT RIGHT SMALL FINGER POSSIBLE SECONDARY WOUND CLOSURE;  Surgeon: Marlowe Shores, MD;  Location: [facility] ORS;  Service: Orthopedics;  Laterality: Right;  Mina Marble is available after 4pm   I & D EXTREMITY Right 11/01/2013   Procedure:  IRRIGATION AND DEBRIDEMENT RIGHT  PROXIMAL PHALANGEAL LEVEL AMPUTATION;  Surgeon: Marlowe Shores, MD;  Location: [facility] ORS;  Service: Orthopedics;  Laterality: Right;   INCISION AND DRAINAGE Right 10/28/2013   Procedure: INCISION AND DRAINAGE Right small finger;  Surgeon: Marlowe Shores, MD;  Location: [facility] ORS;  Service: Orthopedics;  Laterality: Right;   INCISION AND DRAINAGE OF WOUND Left 11/07/2022   Procedure: IRRIGATION AND DEBRIDEMENT WOUND;  Surgeon: Pilar Plate, DPM;  Location: [facility] OR;  Service: Podiatry;  Laterality: Left;  Left  foot ucler debridement and bone biopsy   LEFT HEART CATH Left 11/03/2011   Procedure: LEFT HEART CATH;  Surgeon: Lennette Bihari, MD;  Location: [facility] CATH LAB;  Service: Cardiovascular;  Laterality: Left;   LEFT HEART CATH AND CORONARY ANGIOGRAPHY N/A 04/09/2019   Procedure: LEFT HEART CATH AND CORONARY ANGIOGRAPHY;  Surgeon: Corky Crafts, MD;  Location: [facility] INVASIVE CV LAB;  Service: Cardiovascular;  Laterality: N/A;   LEFT HEART CATHETERIZATION WITH CORONARY ANGIOGRAM N/A 07/03/2014   Procedure: LEFT HEART CATHETERIZATION WITH CORONARY ANGIOGRAM;  Surgeon: Lesleigh Noe, MD;  Location: [facility] CATH LAB;  Service: Cardiovascular;  Laterality: N/A;   PERCUTANEOUS CORONARY STENT INTERVENTION (PCI-S) Left 11/03/2011   Procedure: PERCUTANEOUS CORONARY STENT INTERVENTION (PCI-S);  Surgeon: Lennette Bihari, MD;  Location: [facility] CATH LAB;  Service: Cardiovascular;  Laterality: Left;   RIGHT/LEFT HEART CATH AND CORONARY ANGIOGRAPHY N/A 08/22/2019   Procedure: RIGHT/LEFT HEART CATH AND CORONARY  ANGIOGRAPHY;  Surgeon: Swaziland, Peter M, MD;  Location: [facility] INVASIVE CV LAB;  Service: Cardiovascular;  Laterality: N/A;   TOE AMPUTATION Right ~ 2010   "2nd toe"   TOE AMPUTATION Right 2012   "3rd toe"   WOUND DEBRIDEMENT Left 11/30/2022   Procedure: DEBRIDEMENT WOUND;  Surgeon: Edwin Cap, DPM;  Location: [facility] OR;  Service: Podiatry;  Laterality: Left;   Patient Active Problem List   Diagnosis Date Noted   Vitamin D deficiency 07/28/2023   DKA (diabetic ketoacidosis) (HCC) 07/10/2023   Pharyngitis 07/10/2023   Lactic acidosis 07/10/2023   SIRS (systemic inflammatory response syndrome) (HCC) 07/10/2023   Elevated troponin 07/10/2023   Diabetic ulcer of left midfoot associated with diabetes mellitus due to underlying condition, with fat layer exposed (HCC) 12/02/2022   Acute osteomyelitis of metatarsal bone of left foot (HCC) 11/30/2022   Left foot pain 11/26/2022   Protein-calorie malnutrition, severe 11/08/2022   Osteomyelitis (HCC) 11/06/2022   Diabetic foot infection (HCC) 11/06/2022   Osteomyelitis of fifth toe of left foot (HCC) 11/05/2022   (HFpEF) heart failure with preserved ejection fraction (HCC) 11/05/2022   CKD (chronic kidney disease), stage IV (HCC) 11/05/2022   Dysphagia 03/16/2022   History of colonic polyps 03/16/2022   Spinal stenosis of lumbar region 11/03/2021   Mixed diabetic hyperlipidemia associated with type 2 diabetes mellitus (HCC) 08/11/2021   Chronic low back pain    Hypokalemia 09/25/2020   Insulin dependent type 2 diabetes mellitus (HCC) 09/24/2020   Unstable angina (HCC) 08/17/2019   Cardiac chest pain 08/16/2019   NSTEMI (non-ST elevated myocardial infarction) (HCC) 04/06/2019   ACS (acute coronary syndrome) (HCC) 04/06/2019   Sepsis (HCC) 03/15/2018   DKA (diabetic ketoacidosis) (HCC) 03/15/2018   Preop cardiovascular exam 08/22/2017   Acute renal failure superimposed on stage 3b chronic kidney disease (HCC) 05/19/2017   Essential  hypertension    Status post transmetatarsal amputation of foot, right (HCC) 11/08/2016   Diabetic polyneuropathy associated with type 2 diabetes mellitus (HCC) 11/08/2016   Pure hypercholesterolemia    AKI (acute kidney injury) (HCC)    Acute blood loss anemia    DM2 (diabetes mellitus, type 2) (HCC)    Physical debility 08/03/2016   Chronic osteomyelitis of right foot (HCC)    Cellulitis and abscess of leg, except foot    Non-healing wound of left lower extremity    Stage 3b chronic kidney disease (CKD) (HCC)    Unstable angina pectoris (HCC) 07/18/2016   Chronic diastolic CHF (congestive heart failure) (HCC)  Chest pain with moderate risk for cardiac etiology 06/24/2014   CAD S/P multiple PCIs 06/24/2014   Sleep apnea- on C-pap 06/24/2014   Class 3 severe obesity with serious comorbidity and body mass index (BMI) of 45.0 to 49.9 in adult (HCC) 11/10/2013   Hyperglycemia 10/29/2013   Hyperlipidemia 01/12/2011   Benign essential HTN 01/12/2011   GERD (gastroesophageal reflux disease) 01/12/2011   Acute osteomyelitis of hindfoot, left (HCC) 01/12/2011    ONSET DATE: 08/17/2023 (referral date)  REFERRING DIAG: E11.42 (ICD-10-CM) - Diabetic polyneuropathy associated with type 2 diabetes mellitus (HCC)  THERAPY DIAG:  Other abnormalities of gait and mobility  Muscular deconditioning  Unsteadiness on feet  Rationale for Evaluation and Treatment: Rehabilitation  SUBJECTIVE:                                                                                                                                                                                             SUBJECTIVE STATEMENT: Patient reports that overall doing well; had follow up with medical team to help regulate BP. Patient denies falls/near falls.   Pt accompanied by: self  PERTINENT HISTORY: nonhealing ulcer of LLE, DMII, diabetic polyneuropathy   PAIN:  Are you having pain? No  PRECAUTIONS: Fall and Other: post  op shoe on LLE  RED FLAGS: None   WEIGHT BEARING RESTRICTIONS:  Requires post op boot on LLE due to wound  FALLS: Has patient fallen in last 6 months? Yes. Number of falls 10+ see above, foot rolling out primary cause  LIVING ENVIRONMENT: Lives with: lives with their family with son Lives in: Mobile home Stairs: Yes: External: 5-6 steps; can reach both Has following equipment at home: Walker - 2 wheeled  PLOF: Independent  PATIENT GOALS: "I just want to improve my balance; I feel like my legs have gotten really weak."   OBJECTIVE:   DIAGNOSTIC FINDINGS:  CT Head wo Contrast 12/08/2022: "IMPRESSION: Negative head CT. No acute intracranial abnormality identified.  COGNITION: Overall cognitive status: Within functional limits for tasks assessed   TODAY'S TREATMENT:  Vitals:   09/29/23 1237  BP: (!) 160/92  Pulse: 87  Assessed seated on L arm.   NMR: 6 Blaze pods on random setting for improved lateral stepping, modified single leg stance, and dynamic balance.  Performed on 1 minute intervals with 30 second rest periods.  Pt requires minA guarding. Round 1:  lateral along bar at mirror with single UE support and 6" hurdle step overs setup.  5 hits. Round 2:   lateral along bar at mirror with single UE support and 6" hurdle step overs  setup.  8 hits. Round 3:   lateral along bar at mirror with single UE support and 6" hurdle step overs  setup.  9 hits. Notable errors/deficits:  required intermittent increased in assistance due to decrease ankle control and poor eccentric control from swing to stance 6 Blaze pods on random setting for improved single leg stance and static balance control.  Performed on 1 minute intervals with 30 second rest periods.  Pt requires minA guarding. Round 1:  patient static standing in middle with taps to forward, to side, and  back setup.  11 hits. Round 2:  patient static standing in middle with taps to forward, to side, and back  setup.  11 hits. Round 3:  patient static standing in middle with taps to forward, to side, and back  setup.  17 hits. Notable errors/deficits:  tendency towards posterior LOB Agility ladder drill working on in/outs without UE support 4 x 10 feet (min-modA) Sit to stands from low mat (at standard chair height) 1 x 3 WBOS, 1 x 2 NBOS with emphasis on eccentric lower to chair emphasis on stability when coming to stand  PATIENT EDUCATION: Education details: Continue HEP Person educated: Patient Education method: Explanation Education comprehension: verbalized understanding and needs further education  HOME EXERCISE PROGRAM:  Access Code: [email or web address] URL: [email or web address] Date: 09/20/2023 Prepared by: Maryruth Eve  Exercises - Standing Hip Abduction with Counter Support  - 1 x daily - 7 x weekly - 3 sets - 10 reps - Bird Dog on Counter  - 1 x daily - 7 x weekly - 3 sets - 10 reps - Standing Marching  - 1 x daily - 7 x weekly - 3 sets - 10 reps  GOALS: Goals reviewed with patient? Yes  LONG TERM GOALS: Target date: 10/18/2023 (STG = LTG due to POC length)   Patient will report demonstrate independence with final HEP in order to maintain current gains and continue to progress after physical therapy discharge.   Baseline: To be provided Goal status: INITIAL  2.  Patient will improve gait speed to 0.61 m/s to indicate progress towards the level of community ambulator in order to participate more easily in activities outside of the home.   Baseline: 0.51 m/s with 2WW + post op shoe Goal status: INITIAL  3.  Patient will improve their 5x Sit to Stand score to less than 40 seconds to demonstrate a decreased risk for falls and improved LE strength.   Baseline: 52.15 without UE use, unstable coming to stand Goal status: INITIAL  4.  Patient will improve TUG score to  15 seconds or less to indicate progress towards a decreased risk of falls and demonstrate improved overall mobility.   Baseline: 19.34 with 2WW + post op shoe Goal status: INITIAL  ASSESSMENT:  CLINICAL IMPRESSION: Skilled PT session emphasized work on dynamic and static balance with emphasis on modified SLS tasks and functional transitions in stability with transfers. Patient  required up to [facility] in today's session with increase in muscle fatigue with stepping tasks but demonstrated excellent balance strategies with sit to stands. Continue POC to progress towards LTGs.   OBJECTIVE IMPAIRMENTS: Abnormal gait, decreased balance, difficulty walking, decreased ROM, decreased strength, impaired sensation, and pain.   ACTIVITY LIMITATIONS: bending, squatting, transfers, reach over head, and locomotion level  PARTICIPATION LIMITATIONS: cleaning, community activity, and yard work  PERSONAL FACTORS: Past/current experiences, Time since onset of injury/illness/exacerbation, and 3+ comorbidities: see above  are also affecting patient's functional outcome.   REHAB POTENTIAL: Fair given wound on LLE and noncompliance reported in today's session  CLINICAL DECISION MAKING: Stable/uncomplicated  EVALUATION COMPLEXITY: Low  PLAN:  PT FREQUENCY: 2x/week  PT DURATION: 4 weeks  PLANNED INTERVENTIONS: Therapeutic exercises, Therapeutic activity, Neuromuscular re-education, Balance training, Gait training, Patient/Family education, Self Care, DME instructions, and Re-evaluation  PLAN FOR NEXT SESSION: work on balance reactions (stepping compensatory strategies), review compliance with AD as need, did patient get cane for in home ambulation, did patient follow up with PCP about accident, progress stepping and reaching tapping with blaze pods, initial balance with sit to stands    Carmelia Bake, PT, DPT 09/29/2023, 1:24 PM

## 2023-10-03 ENCOUNTER — Encounter: Payer: Self-pay | Admitting: Podiatry

## 2023-10-03 ENCOUNTER — Ambulatory Visit (INDEPENDENT_AMBULATORY_CARE_PROVIDER_SITE_OTHER): Payer: 59 | Admitting: Podiatry

## 2023-10-03 VITALS — Ht 69.0 in | Wt 264.0 lb

## 2023-10-03 DIAGNOSIS — L97522 Non-pressure chronic ulcer of other part of left foot with fat layer exposed: Secondary | ICD-10-CM

## 2023-10-04 ENCOUNTER — Encounter: Payer: Self-pay | Admitting: Physical Therapy

## 2023-10-04 ENCOUNTER — Ambulatory Visit: Payer: 59 | Admitting: Physical Therapy

## 2023-10-04 VITALS — BP 160/79 | HR 89

## 2023-10-04 DIAGNOSIS — R2689 Other abnormalities of gait and mobility: Secondary | ICD-10-CM | POA: Diagnosis not present

## 2023-10-04 DIAGNOSIS — R29898 Other symptoms and signs involving the musculoskeletal system: Secondary | ICD-10-CM | POA: Diagnosis not present

## 2023-10-04 DIAGNOSIS — R2681 Unsteadiness on feet: Secondary | ICD-10-CM | POA: Diagnosis not present

## 2023-10-04 NOTE — Progress Notes (Signed)
Subjective:  Patient ID: Joshua [name], male    DOB: December 15, 1955,  MRN: [id number]  Chief Complaint  Patient presents with   Wound Check    F/U for left foot ulcer    67 y.o. male presents with the above complaint. History confirmed with patient.  He returns for follow-up, he has been using the Prisma   Objective:  Physical Exam: warm, good capillary refill, weakly palpable DP and PT pulses, and diffuse peripheral neuropathy, full-thickness ulceration plantar cuboid area measuring 0.7 x 0.7 x 0.3 cm.  Exposed subcutaneous tissue with surrounding hyperkeratosis.  No signs of infection no exposed bone tendon or joint.  Well-healed transmetatarsal amputation on the right foot..     Assessment:   1. Ulcer of left foot with fat layer exposed (HCC)        Plan:  Patient was evaluated and treated and all questions answered.    Ulcer left foot -We discussed the etiology and factors that are a part of the wound healing process.  We also discussed the risk of infection both soft tissue and osteomyelitis from open ulceration.  Discussed the risk of limb loss if this happens or worsens. -Debridement as below.  Significant improvement today -Dressed with Iodosorb, DSD. -Continue home dressing changes  3 times weekly with Prisma and bandage -Continue off-loading with surgical shoe and peg assist device. -HgbA1c: 7.8% in May of this year -Discussed with him that his position seems to be maintaining, if ulcer threatens to recur or persistent callus here may need TA transfer and TAL   Procedure: Excisional Debridement of Wound Rationale: Removal of non-viable soft tissue from the wound to promote healing.  Anesthesia: none Post-Debridement Wound Measurements: Noted above Type of Debridement: Sharp Excisional Tissue Removed: Non-viable soft tissue Depth of Debridement: subcutaneous tissue. Technique: Sharp excisional debridement to bleeding, viable wound base.  Dressing: Dry,  sterile, compression dressing. Disposition: Patient tolerated procedure well.      Return in about 3 weeks (around 10/24/2023) for wound care.

## 2023-10-04 NOTE — Therapy (Signed)
OUTPATIENT PHYSICAL THERAPY NEURO TREATMENT   Patient Name: Joshua [name] MRN: [id number] DOB:09-05-1956, 67 y.o., male Today's Date: 10/04/2023   PCP:  Mohammed Kindle, PA REFERRING PROVIDER: Glean Salvo, NP  END OF SESSION:  PT End of Session - 10/04/23 1319     Visit Number 6    Number of Visits 9    Date for PT Re-Evaluation 10/18/23    Authorization Type [employer]    PT Start Time 1315    PT Stop Time 1400    PT Time Calculation (min) 45 min    Equipment Utilized During Treatment Gait belt    Activity Tolerance Patient tolerated treatment well    Behavior During Therapy WFL for tasks assessed/performed             Past Medical History:  Diagnosis Date   Acute coronary syndrome (HCC)    Anemia    B12 deficiency    Chest pain    CHF (congestive heart failure) (HCC)    Chronic combined systolic and diastolic CHF (congestive heart failure) (HCC)    a. 07/2015 Echo: EF 45-50%, mod LVH, mid apical/antsept AK, Gr 1 DD.   Chronic low back pain    CKD (chronic kidney disease), stage III (HCC)    Constipation    COPD (chronic obstructive pulmonary disease) (HCC)    Coronary artery disease    a. 2012 s/p MI/PCI: s/p DES to mid/dist RCA and overlapping DES to LAD;  b. 07/2015 Cath: LAD 10% ISR, D1 40(jailed), LCX 66m, OM2 30, OM3 30, RCA 72m ISR, 10d ISR; c. 07/2016 NSTEMI/PCI: LAD 85ost/p/m (3.5x20 Synergy DES overlapping prior stent), D1 40, LCX 42m, OM2 95 (2.75x16 Synergy DES), OM3 30, RCA 61m, 10d.   Depression    Diabetic gastroparesis (HCC)    Diabetic polyneuropathy (HCC)    DKA (diabetic ketoacidoses) 03/14/2018   GERD (gastroesophageal reflux disease)    Gout    Heart failure (HCC)    Heart murmur    History of MI (myocardial infarction)    Hyperlipemia    Hypertension    Hypertensive heart disease    Hypokalemia    Ischemic cardiomyopathy    a. 07/2015 Echo: EF 45-50%, mod LVH, mid-apicalanteroseptal AK, Gr1 DD.   Joint pain    Kidney  problem    Morbid obesity (HCC)    OSA on CPAP    Osteoarthritis    Osteomyelitis (HCC)    a. 07/2016 MTP joint of R great toe s/p transmetatarsal amputation.   Other fatigue    Polyneuropathy in diabetes(357.2)    Pulmonary sarcoidosis (HCC)    "problems w/it years ago; no problems anymore" (07/03/2014)   Rectal bleeding    Shortness of breath    Shortness of breath on exertion    Sleep apnea    Stomach ulcer    Swallowing difficulty    Type II diabetes mellitus (HCC)    Past Surgical History:  Procedure Laterality Date   ABDOMINAL AORTOGRAM W/LOWER EXTREMITY N/A 11/29/2022   Procedure: ABDOMINAL AORTOGRAM W/LOWER EXTREMITY;  Surgeon: Maeola Harman, MD;  Location: [facility] INVASIVE CV LAB;  Service: Cardiovascular;  Laterality: N/A;   AMPUTATION Right 07/24/2016   Procedure: TRANSMETATARSAL FOOT AMPUTATION;  Surgeon: Nadara Mustard, MD;  Location: [facility] OR;  Service: Orthopedics;  Laterality: Right;   APPLICATION OF WOUND VAC Left 11/30/2022   Procedure: APPLICATION OF WOUND VAC;  Surgeon: Edwin Cap, DPM;  Location: [facility] OR;  Service: Podiatry;  Laterality:  Left;   BALLOON DILATION N/A 03/21/2018   Procedure: BALLOON DILATION;  Surgeon: Bernette Redbird, MD;  Location: [facility] ENDOSCOPY;  Service: Endoscopy;  Laterality: N/A;   BIOPSY  03/16/2022   Procedure: BIOPSY;  Surgeon: Charlott Rakes, MD;  Location: [facility] ENDOSCOPY;  Service: Endoscopy;;   BREAST LUMPECTOMY Right ~ 2000   "benign"   CARDIAC CATHETERIZATION N/A 07/30/2015   Procedure: Left Heart Cath and Coronary Angiography;  Surgeon: Kathleene Hazel, MD;  Location: [facility] INVASIVE CV LAB;  Service: Cardiovascular;  Laterality: N/A;   CARDIAC CATHETERIZATION N/A 07/19/2016   Procedure: Left Heart Cath and Coronary Angiography;  Surgeon: Lyn Records, MD;  Location: [facility] INVASIVE CV LAB;  Service: Cardiovascular;  Laterality: N/A;   CARDIAC CATHETERIZATION N/A 07/29/2016   Procedure: Coronary Stent Intervention;  Surgeon: Lyn Records, MD;  Location: [facility] INVASIVE CV LAB;  Service: Cardiovascular;  Laterality: N/A;   CARDIAC CATHETERIZATION N/A 07/29/2016   Procedure: Intravascular Ultrasound/IVUS;  Surgeon: Lyn Records, MD;  Location: [facility] INVASIVE CV LAB;  Service: Cardiovascular;  Laterality: N/A;   CATARACT EXTRACTION W/ INTRAOCULAR LENS  IMPLANT, BILATERAL Bilateral 2014-2015   COLONOSCOPY WITH PROPOFOL N/A 08/22/2017   Procedure: COLONOSCOPY WITH PROPOFOL;  Surgeon: Charlott Rakes, MD;  Location: [facility] ENDOSCOPY;  Service: Endoscopy;  Laterality: N/A;   COLONOSCOPY WITH PROPOFOL N/A 03/16/2022   Procedure: COLONOSCOPY WITH PROPOFOL;  Surgeon: Charlott Rakes, MD;  Location: [facility] ENDOSCOPY;  Service: Endoscopy;  Laterality: N/A;   CORONARY ANGIOPLASTY WITH STENT PLACEMENT  10/31/2011   30 % MID ATRIOVENTRICULAR GROOVE CX STENOSIS. 90 % MID RCA.PTA TO LAD/STENTING OF TANDEM 60-70%. LAD STENOSIS 99% REDUCED TO 0%.3.0 X PROMUS DES POSTDILATED TO 3.25MM.   CORONARY ANGIOPLASTY WITH STENT PLACEMENT  07/03/2014   "2"   CORONARY STENT INTERVENTION N/A 04/09/2019   Procedure: CORONARY STENT INTERVENTION;  Surgeon: Corky Crafts, MD;  Location: [facility] INVASIVE CV LAB;  Service: Cardiovascular;  Laterality: N/A;   CORONARY STENT INTERVENTION N/A 08/22/2019   Procedure: CORONARY STENT INTERVENTION;  Surgeon: Swaziland, Peter M, MD;  Location: [facility] INVASIVE CV LAB;  Service: Cardiovascular;  Laterality: N/A;   ESOPHAGOGASTRODUODENOSCOPY (EGD) WITH PROPOFOL N/A 03/21/2018   Procedure: ESOPHAGOGASTRODUODENOSCOPY (EGD) WITH PROPOFOL;  Surgeon: Bernette Redbird, MD;  Location: [facility] ENDOSCOPY;  Service: Endoscopy;  Laterality: N/A;   ESOPHAGOGASTRODUODENOSCOPY (EGD) WITH PROPOFOL N/A 03/16/2022   Procedure: ESOPHAGOGASTRODUODENOSCOPY (EGD) WITH PROPOFOL;  Surgeon: Charlott Rakes, MD;  Location: [facility] ENDOSCOPY;  Service: Endoscopy;  Laterality: N/A;   GRAFT APPLICATION Left 11/30/2022   Procedure: GRAFT APPLICATION;  Surgeon: Edwin Cap,  DPM;  Location: [facility] OR;  Service: Podiatry;  Laterality: Left;   I & D EXTREMITY Right 10/30/2013   Procedure: IRRIGATION AND DEBRIDEMENT RIGHT SMALL FINGER POSSIBLE SECONDARY WOUND CLOSURE;  Surgeon: Marlowe Shores, MD;  Location: [facility] ORS;  Service: Orthopedics;  Laterality: Right;  Mina Marble is available after 4pm   I & D EXTREMITY Right 11/01/2013   Procedure:  IRRIGATION AND DEBRIDEMENT RIGHT  PROXIMAL PHALANGEAL LEVEL AMPUTATION;  Surgeon: Marlowe Shores, MD;  Location: [facility] ORS;  Service: Orthopedics;  Laterality: Right;   INCISION AND DRAINAGE Right 10/28/2013   Procedure: INCISION AND DRAINAGE Right small finger;  Surgeon: Marlowe Shores, MD;  Location: [facility] ORS;  Service: Orthopedics;  Laterality: Right;   INCISION AND DRAINAGE OF WOUND Left 11/07/2022   Procedure: IRRIGATION AND DEBRIDEMENT WOUND;  Surgeon: Pilar Plate, DPM;  Location: [facility] OR;  Service: Podiatry;  Laterality: Left;  Left  foot ucler debridement and bone biopsy   LEFT HEART CATH Left 11/03/2011   Procedure: LEFT HEART CATH;  Surgeon: Lennette Bihari, MD;  Location: [facility] CATH LAB;  Service: Cardiovascular;  Laterality: Left;   LEFT HEART CATH AND CORONARY ANGIOGRAPHY N/A 04/09/2019   Procedure: LEFT HEART CATH AND CORONARY ANGIOGRAPHY;  Surgeon: Corky Crafts, MD;  Location: [facility] INVASIVE CV LAB;  Service: Cardiovascular;  Laterality: N/A;   LEFT HEART CATHETERIZATION WITH CORONARY ANGIOGRAM N/A 07/03/2014   Procedure: LEFT HEART CATHETERIZATION WITH CORONARY ANGIOGRAM;  Surgeon: Lesleigh Noe, MD;  Location: [facility] CATH LAB;  Service: Cardiovascular;  Laterality: N/A;   PERCUTANEOUS CORONARY STENT INTERVENTION (PCI-S) Left 11/03/2011   Procedure: PERCUTANEOUS CORONARY STENT INTERVENTION (PCI-S);  Surgeon: Lennette Bihari, MD;  Location: [facility] CATH LAB;  Service: Cardiovascular;  Laterality: Left;   RIGHT/LEFT HEART CATH AND CORONARY ANGIOGRAPHY N/A 08/22/2019   Procedure: RIGHT/LEFT HEART CATH AND CORONARY  ANGIOGRAPHY;  Surgeon: Swaziland, Peter M, MD;  Location: [facility] INVASIVE CV LAB;  Service: Cardiovascular;  Laterality: N/A;   TOE AMPUTATION Right ~ 2010   "2nd toe"   TOE AMPUTATION Right 2012   "3rd toe"   WOUND DEBRIDEMENT Left 11/30/2022   Procedure: DEBRIDEMENT WOUND;  Surgeon: Edwin Cap, DPM;  Location: [facility] OR;  Service: Podiatry;  Laterality: Left;   Patient Active Problem List   Diagnosis Date Noted   Vitamin D deficiency 07/28/2023   DKA (diabetic ketoacidosis) (HCC) 07/10/2023   Pharyngitis 07/10/2023   Lactic acidosis 07/10/2023   SIRS (systemic inflammatory response syndrome) (HCC) 07/10/2023   Elevated troponin 07/10/2023   Diabetic ulcer of left midfoot associated with diabetes mellitus due to underlying condition, with fat layer exposed (HCC) 12/02/2022   Acute osteomyelitis of metatarsal bone of left foot (HCC) 11/30/2022   Left foot pain 11/26/2022   Protein-calorie malnutrition, severe 11/08/2022   Osteomyelitis (HCC) 11/06/2022   Diabetic foot infection (HCC) 11/06/2022   Osteomyelitis of fifth toe of left foot (HCC) 11/05/2022   (HFpEF) heart failure with preserved ejection fraction (HCC) 11/05/2022   CKD (chronic kidney disease), stage IV (HCC) 11/05/2022   Dysphagia 03/16/2022   History of colonic polyps 03/16/2022   Spinal stenosis of lumbar region 11/03/2021   Mixed diabetic hyperlipidemia associated with type 2 diabetes mellitus (HCC) 08/11/2021   Chronic low back pain    Hypokalemia 09/25/2020   Insulin dependent type 2 diabetes mellitus (HCC) 09/24/2020   Unstable angina (HCC) 08/17/2019   Cardiac chest pain 08/16/2019   NSTEMI (non-ST elevated myocardial infarction) (HCC) 04/06/2019   ACS (acute coronary syndrome) (HCC) 04/06/2019   Sepsis (HCC) 03/15/2018   DKA (diabetic ketoacidosis) (HCC) 03/15/2018   Preop cardiovascular exam 08/22/2017   Acute renal failure superimposed on stage 3b chronic kidney disease (HCC) 05/19/2017   Essential  hypertension    Status post transmetatarsal amputation of foot, right (HCC) 11/08/2016   Diabetic polyneuropathy associated with type 2 diabetes mellitus (HCC) 11/08/2016   Pure hypercholesterolemia    AKI (acute kidney injury) (HCC)    Acute blood loss anemia    DM2 (diabetes mellitus, type 2) (HCC)    Physical debility 08/03/2016   Chronic osteomyelitis of right foot (HCC)    Cellulitis and abscess of leg, except foot    Non-healing wound of left lower extremity    Stage 3b chronic kidney disease (CKD) (HCC)    Unstable angina pectoris (HCC) 07/18/2016   Chronic diastolic CHF (congestive heart failure) (HCC)  Chest pain with moderate risk for cardiac etiology 06/24/2014   CAD S/P multiple PCIs 06/24/2014   Sleep apnea- on C-pap 06/24/2014   Class 3 severe obesity with serious comorbidity and body mass index (BMI) of 45.0 to 49.9 in adult [facility]) 11/10/2013   Hyperglycemia 10/29/2013   Hyperlipidemia 01/12/2011   Benign essential HTN 01/12/2011   GERD (gastroesophageal reflux disease) 01/12/2011   Acute osteomyelitis of hindfoot, left (HCC) 01/12/2011    ONSET DATE: 08/17/2023 (referral date)  REFERRING DIAG: E11.42 (ICD-10-CM) - Diabetic polyneuropathy associated with type 2 diabetes mellitus (HCC)  THERAPY DIAG:  Other abnormalities of gait and mobility  Muscular deconditioning  Unsteadiness on feet  Rationale for Evaluation and Treatment: Rehabilitation  SUBJECTIVE:                                                                                                                                                                                             SUBJECTIVE STATEMENT: Patient reports that overall doing well; he reports addition of losartan to medication regimen, but is unsure of dosage.  He denies falls, but had a near fall turning to his bathroom sink where he stepped sideways and his leg gave out.  He caught himself.  He denies pain today.  Patient states he may get  the cane for home use next weekend.  Pt accompanied by: self  PERTINENT HISTORY: nonhealing ulcer of LLE, DMII, diabetic polyneuropathy   PAIN:  Are you having pain? No  PRECAUTIONS: Fall and Other: post op shoe on LLE  RED FLAGS: None   WEIGHT BEARING RESTRICTIONS:  Requires post op boot on LLE due to wound  FALLS: Has patient fallen in last 6 months? Yes. Number of falls 10+ see above, foot rolling out primary cause  LIVING ENVIRONMENT: Lives with: lives with their family with son Lives in: Mobile home Stairs: Yes: External: 5-6 steps; can reach both Has following equipment at home: Walker - 2 wheeled  PLOF: Independent  PATIENT GOALS: "I just want to improve my balance; I feel like my legs have gotten really weak."   OBJECTIVE:   DIAGNOSTIC FINDINGS:  CT Head wo Contrast 12/08/2022: "IMPRESSION: Negative head CT. No acute intracranial abnormality identified.  COGNITION: Overall cognitive status: Within functional limits for tasks assessed   TODAY'S TREATMENT:  ASSESSED on LUE in sitting prior to interventions: Vitals:   10/04/23 1322  BP: (!) 160/79  Pulse: 89    TherEx: SciFit x8 minutes in multi-peaks mode up to level 5.0 using BUE/BLE for dynamic cardiovascular warmup and large amplitude mobility.  RPE 6/10 following task.  Edu on taking time when standing and initiating gait as pt moves too quickly resulting in catching left foot on walker during initial steps.  He does not acknowledge education and continues at same pace.  NMR: STS arms folded to chest x10, cues and pelvic facilitation to increase anterior weight shift and decreased reliance on legs against mat STS w/ 10lb kettlebell low hold to increase anterior weight shift and immediate standing stability x10 STS w/ overhead press using bilateral 3lb dumbbells x10 STS w/ slam ball  push to far midline target 6lbs x 3 > 8lbs x 5 > 10 lbs x5 SBA  PATIENT EDUCATION: Education details: Continue HEP.  Ongoing foot assessment daily for preventative care. Person educated: Patient Education method: Explanation Education comprehension: verbalized understanding and needs further education  HOME EXERCISE PROGRAM:  Access Code: [email or web address] URL: [email or web address] Date: 09/20/2023 Prepared by: Maryruth Eve  Exercises - Standing Hip Abduction with Counter Support  - 1 x daily - 7 x weekly - 3 sets - 10 reps - Bird Dog on Counter  - 1 x daily - 7 x weekly - 3 sets - 10 reps - Standing Marching  - 1 x daily - 7 x weekly - 3 sets - 10 reps  GOALS: Goals reviewed with patient? Yes  LONG TERM GOALS: Target date: 10/18/2023 (STG = LTG due to POC length)   Patient will report demonstrate independence with final HEP in order to maintain current gains and continue to progress after physical therapy discharge.   Baseline: To be provided Goal status: INITIAL  2.  Patient will improve gait speed to 0.61 m/s to indicate progress towards the level of community ambulator in order to participate more easily in activities outside of the home.   Baseline: 0.51 m/s with 2WW + post op shoe Goal status: INITIAL  3.  Patient will improve their 5x Sit to Stand score to less than 40 seconds to demonstrate a decreased risk for falls and improved LE strength.   Baseline: 52.15 without UE use, unstable coming to stand Goal status: INITIAL  4.  Patient will improve TUG score to 15 seconds or less to indicate progress towards a decreased risk of falls and demonstrate improved overall mobility.   Baseline: 19.34 with 2WW + post op shoe Goal status: INITIAL  ASSESSMENT:  CLINICAL IMPRESSION: Session remained limited due to patient in left offloading shoe and therapist not feeling safe to work on aggressive stepping strategy due to lack of traction and gait changes.  He does well  with progression of STS this session demonstrating improved anterior weight shift and core engagement to stabilize on initial standing.  He continues to benefit from skilled PT to address ongoing deficits as would hopefully is close to mending and pt able to wean from offloading shoe for more progressed POC.  He may benefit from going on hold at end of current POC until out of offloading shoe and further surgical intervention decided.  OBJECTIVE IMPAIRMENTS: Abnormal gait, decreased balance, difficulty walking, decreased ROM, decreased strength, impaired sensation, and pain.   ACTIVITY LIMITATIONS: bending, squatting, transfers, reach over head, and locomotion level  PARTICIPATION LIMITATIONS: cleaning, community activity, and yard work  PERSONAL FACTORS: Past/current  experiences, Time since onset of injury/illness/exacerbation, and 3+ comorbidities: see above  are also affecting patient's functional outcome.   REHAB POTENTIAL: Fair given wound on LLE and noncompliance reported in today's session  CLINICAL DECISION MAKING: Stable/uncomplicated  EVALUATION COMPLEXITY: Low  PLAN:  PT FREQUENCY: 2x/week  PT DURATION: 4 weeks  PLANNED INTERVENTIONS: Therapeutic exercises, Therapeutic activity, Neuromuscular re-education, Balance training, Gait training, Patient/Family education, Self Care, DME instructions, and Re-evaluation  PLAN FOR NEXT SESSION: work on balance reactions (stepping compensatory strategies), review compliance with AD as need, did patient get cane for in home ambulation, did patient follow up with PCP about accident, progress stepping and reaching tapping with blaze pods, initial balance with sit to stands    Sadie Haber, PT, DPT 10/04/2023, 2:05 PM

## 2023-10-06 ENCOUNTER — Ambulatory Visit: Payer: 59 | Admitting: Physical Therapy

## 2023-10-10 ENCOUNTER — Encounter: Payer: Self-pay | Admitting: Physical Therapy

## 2023-10-10 ENCOUNTER — Ambulatory Visit: Payer: 59 | Admitting: Physical Therapy

## 2023-10-10 VITALS — BP 161/91 | HR 90

## 2023-10-10 DIAGNOSIS — R2681 Unsteadiness on feet: Secondary | ICD-10-CM | POA: Diagnosis not present

## 2023-10-10 DIAGNOSIS — R29898 Other symptoms and signs involving the musculoskeletal system: Secondary | ICD-10-CM

## 2023-10-10 DIAGNOSIS — R2689 Other abnormalities of gait and mobility: Secondary | ICD-10-CM | POA: Diagnosis not present

## 2023-10-10 NOTE — Therapy (Signed)
OUTPATIENT PHYSICAL THERAPY NEURO TREATMENT   Patient Name: Joshua [name] MRN: [id number] DOB:11/19/1956, 67 y.o., male Today's Date: 10/10/2023   PCP:  Mohammed Kindle, PA REFERRING PROVIDER: Glean Salvo, NP  END OF SESSION:  PT End of Session - 10/10/23 1410     Visit Number 7    Number of Visits 9    Date for PT Re-Evaluation 10/18/23    Authorization Type [employer]    PT Start Time 1404    PT Stop Time 1446    PT Time Calculation (min) 42 min    Equipment Utilized During Treatment Gait belt    Activity Tolerance Patient tolerated treatment well;Treatment limited secondary to medical complications (Comment)   HTN, pt reporting fogginess and having not eaten today   Behavior During Therapy WFL for tasks assessed/performed             Past Medical History:  Diagnosis Date   Acute coronary syndrome (HCC)    Anemia    B12 deficiency    Chest pain    CHF (congestive heart failure) (HCC)    Chronic combined systolic and diastolic CHF (congestive heart failure) (HCC)    a. 07/2015 Echo: EF 45-50%, mod LVH, mid apical/antsept AK, Gr 1 DD.   Chronic low back pain    CKD (chronic kidney disease), stage III (HCC)    Constipation    COPD (chronic obstructive pulmonary disease) (HCC)    Coronary artery disease    a. 2012 s/p MI/PCI: s/p DES to mid/dist RCA and overlapping DES to LAD;  b. 07/2015 Cath: LAD 10% ISR, D1 40(jailed), LCX 35m, OM2 30, OM3 30, RCA 24m ISR, 10d ISR; c. 07/2016 NSTEMI/PCI: LAD 85ost/p/m (3.5x20 Synergy DES overlapping prior stent), D1 40, LCX 53m, OM2 95 (2.75x16 Synergy DES), OM3 30, RCA 35m, 10d.   Depression    Diabetic gastroparesis (HCC)    Diabetic polyneuropathy (HCC)    DKA (diabetic ketoacidoses) 03/14/2018   GERD (gastroesophageal reflux disease)    Gout    Heart failure (HCC)    Heart murmur    History of MI (myocardial infarction)    Hyperlipemia    Hypertension    Hypertensive heart disease    Hypokalemia     Ischemic cardiomyopathy    a. 07/2015 Echo: EF 45-50%, mod LVH, mid-apicalanteroseptal AK, Gr1 DD.   Joint pain    Kidney problem    Morbid obesity (HCC)    OSA on CPAP    Osteoarthritis    Osteomyelitis (HCC)    a. 07/2016 MTP joint of R great toe s/p transmetatarsal amputation.   Other fatigue    Polyneuropathy in diabetes(357.2)    Pulmonary sarcoidosis (HCC)    "problems w/it years ago; no problems anymore" (07/03/2014)   Rectal bleeding    Shortness of breath    Shortness of breath on exertion    Sleep apnea    Stomach ulcer    Swallowing difficulty    Type II diabetes mellitus (HCC)    Past Surgical History:  Procedure Laterality Date   ABDOMINAL AORTOGRAM W/LOWER EXTREMITY N/A 11/29/2022   Procedure: ABDOMINAL AORTOGRAM W/LOWER EXTREMITY;  Surgeon: Maeola Harman, MD;  Location: [facility] INVASIVE CV LAB;  Service: Cardiovascular;  Laterality: N/A;   AMPUTATION Right 07/24/2016   Procedure: TRANSMETATARSAL FOOT AMPUTATION;  Surgeon: Nadara Mustard, MD;  Location: [facility] OR;  Service: Orthopedics;  Laterality: Right;   APPLICATION OF WOUND VAC Left 11/30/2022   Procedure: APPLICATION OF WOUND  VAC;  Surgeon: Edwin Cap, DPM;  Location: [facility] OR;  Service: Podiatry;  Laterality: Left;   BALLOON DILATION N/A 03/21/2018   Procedure: BALLOON DILATION;  Surgeon: Bernette Redbird, MD;  Location: [facility] ENDOSCOPY;  Service: Endoscopy;  Laterality: N/A;   BIOPSY  03/16/2022   Procedure: BIOPSY;  Surgeon: Charlott Rakes, MD;  Location: [facility] ENDOSCOPY;  Service: Endoscopy;;   BREAST LUMPECTOMY Right ~ 2000   "benign"   CARDIAC CATHETERIZATION N/A 07/30/2015   Procedure: Left Heart Cath and Coronary Angiography;  Surgeon: Kathleene Hazel, MD;  Location: [facility] INVASIVE CV LAB;  Service: Cardiovascular;  Laterality: N/A;   CARDIAC CATHETERIZATION N/A 07/19/2016   Procedure: Left Heart Cath and Coronary Angiography;  Surgeon: Lyn Records, MD;  Location: [facility] INVASIVE CV LAB;  Service:  Cardiovascular;  Laterality: N/A;   CARDIAC CATHETERIZATION N/A 07/29/2016   Procedure: Coronary Stent Intervention;  Surgeon: Lyn Records, MD;  Location: [facility] INVASIVE CV LAB;  Service: Cardiovascular;  Laterality: N/A;   CARDIAC CATHETERIZATION N/A 07/29/2016   Procedure: Intravascular Ultrasound/IVUS;  Surgeon: Lyn Records, MD;  Location: [facility] INVASIVE CV LAB;  Service: Cardiovascular;  Laterality: N/A;   CATARACT EXTRACTION W/ INTRAOCULAR LENS  IMPLANT, BILATERAL Bilateral 2014-2015   COLONOSCOPY WITH PROPOFOL N/A 08/22/2017   Procedure: COLONOSCOPY WITH PROPOFOL;  Surgeon: Charlott Rakes, MD;  Location: [facility] ENDOSCOPY;  Service: Endoscopy;  Laterality: N/A;   COLONOSCOPY WITH PROPOFOL N/A 03/16/2022   Procedure: COLONOSCOPY WITH PROPOFOL;  Surgeon: Charlott Rakes, MD;  Location: [facility] ENDOSCOPY;  Service: Endoscopy;  Laterality: N/A;   CORONARY ANGIOPLASTY WITH STENT PLACEMENT  10/31/2011   30 % MID ATRIOVENTRICULAR GROOVE CX STENOSIS. 90 % MID RCA.PTA TO LAD/STENTING OF TANDEM 60-70%. LAD STENOSIS 99% REDUCED TO 0%.3.0 X PROMUS DES POSTDILATED TO 3.25MM.   CORONARY ANGIOPLASTY WITH STENT PLACEMENT  07/03/2014   "2"   CORONARY STENT INTERVENTION N/A 04/09/2019   Procedure: CORONARY STENT INTERVENTION;  Surgeon: Corky Crafts, MD;  Location: [facility] INVASIVE CV LAB;  Service: Cardiovascular;  Laterality: N/A;   CORONARY STENT INTERVENTION N/A 08/22/2019   Procedure: CORONARY STENT INTERVENTION;  Surgeon: Swaziland, Peter M, MD;  Location: [facility] INVASIVE CV LAB;  Service: Cardiovascular;  Laterality: N/A;   ESOPHAGOGASTRODUODENOSCOPY (EGD) WITH PROPOFOL N/A 03/21/2018   Procedure: ESOPHAGOGASTRODUODENOSCOPY (EGD) WITH PROPOFOL;  Surgeon: Bernette Redbird, MD;  Location: [facility] ENDOSCOPY;  Service: Endoscopy;  Laterality: N/A;   ESOPHAGOGASTRODUODENOSCOPY (EGD) WITH PROPOFOL N/A 03/16/2022   Procedure: ESOPHAGOGASTRODUODENOSCOPY (EGD) WITH PROPOFOL;  Surgeon: Charlott Rakes, MD;  Location: [facility] ENDOSCOPY;   Service: Endoscopy;  Laterality: N/A;   GRAFT APPLICATION Left 11/30/2022   Procedure: GRAFT APPLICATION;  Surgeon: Edwin Cap, DPM;  Location: [facility] OR;  Service: Podiatry;  Laterality: Left;   I & D EXTREMITY Right 10/30/2013   Procedure: IRRIGATION AND DEBRIDEMENT RIGHT SMALL FINGER POSSIBLE SECONDARY WOUND CLOSURE;  Surgeon: Marlowe Shores, MD;  Location: [facility] ORS;  Service: Orthopedics;  Laterality: Right;  Mina Marble is available after 4pm   I & D EXTREMITY Right 11/01/2013   Procedure:  IRRIGATION AND DEBRIDEMENT RIGHT  PROXIMAL PHALANGEAL LEVEL AMPUTATION;  Surgeon: Marlowe Shores, MD;  Location: [facility] ORS;  Service: Orthopedics;  Laterality: Right;   INCISION AND DRAINAGE Right 10/28/2013   Procedure: INCISION AND DRAINAGE Right small finger;  Surgeon: Marlowe Shores, MD;  Location: [facility] ORS;  Service: Orthopedics;  Laterality: Right;   INCISION AND DRAINAGE OF WOUND Left 11/07/2022   Procedure: IRRIGATION AND DEBRIDEMENT WOUND;  Surgeon:  Standiford, Jenelle Mages, DPM;  Location: [facility] OR;  Service: Podiatry;  Laterality: Left;  Left foot ucler debridement and bone biopsy   LEFT HEART CATH Left 11/03/2011   Procedure: LEFT HEART CATH;  Surgeon: Lennette Bihari, MD;  Location: [facility] CATH LAB;  Service: Cardiovascular;  Laterality: Left;   LEFT HEART CATH AND CORONARY ANGIOGRAPHY N/A 04/09/2019   Procedure: LEFT HEART CATH AND CORONARY ANGIOGRAPHY;  Surgeon: Corky Crafts, MD;  Location: [facility] INVASIVE CV LAB;  Service: Cardiovascular;  Laterality: N/A;   LEFT HEART CATHETERIZATION WITH CORONARY ANGIOGRAM N/A 07/03/2014   Procedure: LEFT HEART CATHETERIZATION WITH CORONARY ANGIOGRAM;  Surgeon: Lesleigh Noe, MD;  Location: [facility] CATH LAB;  Service: Cardiovascular;  Laterality: N/A;   PERCUTANEOUS CORONARY STENT INTERVENTION (PCI-S) Left 11/03/2011   Procedure: PERCUTANEOUS CORONARY STENT INTERVENTION (PCI-S);  Surgeon: Lennette Bihari, MD;  Location: [facility] CATH LAB;  Service: Cardiovascular;   Laterality: Left;   RIGHT/LEFT HEART CATH AND CORONARY ANGIOGRAPHY N/A 08/22/2019   Procedure: RIGHT/LEFT HEART CATH AND CORONARY ANGIOGRAPHY;  Surgeon: Swaziland, Peter M, MD;  Location: [facility] INVASIVE CV LAB;  Service: Cardiovascular;  Laterality: N/A;   TOE AMPUTATION Right ~ 2010   "2nd toe"   TOE AMPUTATION Right 2012   "3rd toe"   WOUND DEBRIDEMENT Left 11/30/2022   Procedure: DEBRIDEMENT WOUND;  Surgeon: Edwin Cap, DPM;  Location: [facility] OR;  Service: Podiatry;  Laterality: Left;   Patient Active Problem List   Diagnosis Date Noted   Vitamin D deficiency 07/28/2023   DKA (diabetic ketoacidosis) (HCC) 07/10/2023   Pharyngitis 07/10/2023   Lactic acidosis 07/10/2023   SIRS (systemic inflammatory response syndrome) (HCC) 07/10/2023   Elevated troponin 07/10/2023   Diabetic ulcer of left midfoot associated with diabetes mellitus due to underlying condition, with fat layer exposed (HCC) 12/02/2022   Acute osteomyelitis of metatarsal bone of left foot (HCC) 11/30/2022   Left foot pain 11/26/2022   Protein-calorie malnutrition, severe 11/08/2022   Osteomyelitis (HCC) 11/06/2022   Diabetic foot infection (HCC) 11/06/2022   Osteomyelitis of fifth toe of left foot (HCC) 11/05/2022   (HFpEF) heart failure with preserved ejection fraction (HCC) 11/05/2022   CKD (chronic kidney disease), stage IV (HCC) 11/05/2022   Dysphagia 03/16/2022   History of colonic polyps 03/16/2022   Spinal stenosis of lumbar region 11/03/2021   Mixed diabetic hyperlipidemia associated with type 2 diabetes mellitus (HCC) 08/11/2021   Chronic low back pain    Hypokalemia 09/25/2020   Insulin dependent type 2 diabetes mellitus (HCC) 09/24/2020   Unstable angina (HCC) 08/17/2019   Cardiac chest pain 08/16/2019   NSTEMI (non-ST elevated myocardial infarction) (HCC) 04/06/2019   ACS (acute coronary syndrome) (HCC) 04/06/2019   Sepsis (HCC) 03/15/2018   DKA (diabetic ketoacidosis) (HCC) 03/15/2018   Preop  cardiovascular exam 08/22/2017   Acute renal failure superimposed on stage 3b chronic kidney disease (HCC) 05/19/2017   Essential hypertension    Status post transmetatarsal amputation of foot, right (HCC) 11/08/2016   Diabetic polyneuropathy associated with type 2 diabetes mellitus (HCC) 11/08/2016   Pure hypercholesterolemia    AKI (acute kidney injury) (HCC)    Acute blood loss anemia    DM2 (diabetes mellitus, type 2) (HCC)    Physical debility 08/03/2016   Chronic osteomyelitis of right foot (HCC)    Cellulitis and abscess of leg, except foot    Non-healing wound of left lower extremity    Stage 3b chronic kidney disease (CKD) (HCC)    Unstable  angina pectoris (HCC) 07/18/2016   Chronic diastolic CHF (congestive heart failure) (HCC)    Chest pain with moderate risk for cardiac etiology 06/24/2014   CAD S/P multiple PCIs 06/24/2014   Sleep apnea- on C-pap 06/24/2014   Class 3 severe obesity with serious comorbidity and body mass index (BMI) of 45.0 to 49.9 in adult (HCC) 11/10/2013   Hyperglycemia 10/29/2013   Hyperlipidemia 01/12/2011   Benign essential HTN 01/12/2011   GERD (gastroesophageal reflux disease) 01/12/2011   Acute osteomyelitis of hindfoot, left (HCC) 01/12/2011    ONSET DATE: 08/17/2023 (referral date)  REFERRING DIAG: E11.42 (ICD-10-CM) - Diabetic polyneuropathy associated with type 2 diabetes mellitus (HCC)  THERAPY DIAG:  Other abnormalities of gait and mobility  Muscular deconditioning  Unsteadiness on feet  Rationale for Evaluation and Treatment: Rehabilitation  SUBJECTIVE:                                                                                                                                                                                             SUBJECTIVE STATEMENT: Patient reports that he returns to podiatrist 11/12 for follow-up of left foot wound healing.  He was sore in his thighs for 3 days after last session. He reports single  fall onto his backside when he walked into his kitchen on laminate flooring without shoe or boot on and it was dark.  He just "took a tumble" but denies hitting head or severe bruising or other injuries.  He denies pain today.  Pt reports he feels foggy in the front of his head, denies true headache or lightheadedness.  He has not eaten today and feels this is contributing to this feeling.  Pt accompanied by: self  PERTINENT HISTORY: nonhealing ulcer of LLE, DMII, diabetic polyneuropathy   PAIN:  Are you having pain? No  PRECAUTIONS: Fall and Other: post op shoe on LLE  RED FLAGS: None   WEIGHT BEARING RESTRICTIONS:  Requires post op boot on LLE due to wound  FALLS: Has patient fallen in last 6 months? Yes. Number of falls 10+ see above, foot rolling out primary cause  LIVING ENVIRONMENT: Lives with: lives with their family with son Lives in: Mobile home Stairs: Yes: External: 5-6 steps; can reach both Has following equipment at home: Walker - 2 wheeled  PLOF: Independent  PATIENT GOALS: "I just want to improve my balance; I feel like my legs have gotten really weak."   OBJECTIVE:   DIAGNOSTIC FINDINGS:  CT Head wo Contrast 12/08/2022: "IMPRESSION: Negative head CT. No acute intracranial abnormality identified.  COGNITION: Overall cognitive status: Within functional limits for tasks assessed   TODAY'S  TREATMENT:                                                                                                                              ASSESSED on LUE in sitting prior to interventions: Vitals:   10/10/23 1417  BP: (!) 161/91  Pulse: 90   Glucose 128 at onset of session.  PT provided crackers and water to pt prior to interventions to assess if pt has positive response in regards to feeling in head.  He confirms he has taken all medicines today without food.  He does report feeling some better after.  SciFit x8 minutes in multi-peaks mode up to level 5.0 using BUE/BLE  for dynamic strengthening and large amplitude mobility.  6 Blaze pods on random one color taps setting for improved dynamic SLS and stepping strategy.  Performed on 1 minute intervals with 30 second to 2 minute rest periods.  Pt requires CGA-minA guarding. Round 1:  Circular floor and mat level setup.  9 hits. Round 2:  " setup.  7 hits. Round 3:  " setup.  9 hits. Notable errors/deficits:  When not using RW for support pt has repeated backwards stepping to catch balance.  Education on awareness of deficits indicating need for rest such as inability to progress forward with steps and repeated LOB posteriorly as noted today.   PATIENT EDUCATION: Education details: Continue HEP.  Ongoing foot assessment daily for preventative care.  Did recommend holding off on transitioning to cane due to ongoing wound healing and recent tumble without AD. Person educated: Patient Education method: Explanation Education comprehension: verbalized understanding and needs further education  HOME EXERCISE PROGRAM:  Access Code: [email or web address] URL: [email or web address] Date: 09/20/2023 Prepared by: Maryruth Eve  Exercises - Standing Hip Abduction with Counter Support  - 1 x daily - 7 x weekly - 3 sets - 10 reps - Bird Dog on Counter  - 1 x daily - 7 x weekly - 3 sets - 10 reps - Standing Marching  - 1 x daily - 7 x weekly - 3 sets - 10 reps  GOALS: Goals reviewed with patient? Yes  LONG TERM GOALS: Target date: 10/18/2023 (STG = LTG due to POC length)   Patient will report demonstrate independence with final HEP in order to maintain current gains and continue to progress after physical therapy discharge.   Baseline: To be provided Goal status: INITIAL  2.  Patient will improve gait speed to 0.61 m/s to indicate progress towards the level of community ambulator in order to participate more easily in activities outside of the home.   Baseline: 0.51 m/s with 2WW + post op shoe Goal status:  INITIAL  3.  Patient will improve their 5x Sit to Stand score to less than 40 seconds to demonstrate a decreased risk for falls and improved LE strength.   Baseline: 52.15 without UE use, unstable coming to stand Goal status: INITIAL  4.  Patient will improve  TUG score to 15 seconds or less to indicate progress towards a decreased risk of falls and demonstrate improved overall mobility.   Baseline: 19.34 with 2WW + post op shoe Goal status: INITIAL  ASSESSMENT:  CLINICAL IMPRESSION: Session limited due to pt reported fogginess and lack of nutrition today.  He has elevated BP this visit just within parameters for therapy despite compliance to medications.  Did continue to address stepping strategy and SLS with pt needing CGA-minA for safest performance.  He continues to benefit from skilled PT to address awareness of fatigue and strategies to limit fall risk.  Will continue per POC.  OBJECTIVE IMPAIRMENTS: Abnormal gait, decreased balance, difficulty walking, decreased ROM, decreased strength, impaired sensation, and pain.   ACTIVITY LIMITATIONS: bending, squatting, transfers, reach over head, and locomotion level  PARTICIPATION LIMITATIONS: cleaning, community activity, and yard work  PERSONAL FACTORS: Past/current experiences, Time since onset of injury/illness/exacerbation, and 3+ comorbidities: see above  are also affecting patient's functional outcome.   REHAB POTENTIAL: Fair given wound on LLE and noncompliance reported in today's session  CLINICAL DECISION MAKING: Stable/uncomplicated  EVALUATION COMPLEXITY: Low  PLAN:  PT FREQUENCY: 2x/week  PT DURATION: 4 weeks  PLANNED INTERVENTIONS: Therapeutic exercises, Therapeutic activity, Neuromuscular re-education, Balance training, Gait training, Patient/Family education, Self Care, DME instructions, and Re-evaluation  PLAN FOR NEXT SESSION: work on balance reactions (stepping compensatory strategies), review compliance with AD  as need, did patient get cane for in home ambulation, did patient follow up with PCP about accident, progress stepping and reaching tapping with blaze pods, initial balance with sit to stands;  Are we holding until pt out of boot?  ASSESS LTGs- re-cert on hold vs D/C?  Sadie Haber, PT, DPT 10/10/2023, 2:54 PM

## 2023-10-11 ENCOUNTER — Ambulatory Visit: Payer: 59 | Admitting: Physical Therapy

## 2023-10-11 DIAGNOSIS — K219 Gastro-esophageal reflux disease without esophagitis: Secondary | ICD-10-CM | POA: Diagnosis not present

## 2023-10-12 NOTE — Progress Notes (Signed)
Care Guide Note  10/12/2023 Name: HAMZAH ANTWINE MRN: [id number] DOB: 01/16/1956  Referred by: Gwenevere Abbot, MD Reason for referral : Care Coordination (Outreach to schedule with Pharm d Thur[facility] Hole)   KARTIKEYA EBERLE is a 67 y.o. year old male who is a primary care patient of Gwenevere Abbot, MD. HAEDYN ODLE was referred to the pharmacist for assistance related to HTN.    Successful contact was made with the patient to discuss pharmacy services including being ready for the pharmacist to call at least 5 minutes before the scheduled appointment time, to have medication bottles and any blood sugar or blood pressure readings ready for review. The patient agreed to meet with the pharmacist via with the pharmacist via telephone visit on (date/time).  10/13/2023  Penne Lash, RMA Care Guide [facility]  [place], [place] [address] Direct Dial: [phone] [email or web address]

## 2023-10-13 ENCOUNTER — Ambulatory Visit: Payer: 59 | Admitting: Physical Therapy

## 2023-10-13 ENCOUNTER — Encounter: Payer: Self-pay | Admitting: Physical Therapy

## 2023-10-13 ENCOUNTER — Other Ambulatory Visit: Payer: 59 | Admitting: Pharmacist

## 2023-10-13 VITALS — BP 156/95 | HR 86

## 2023-10-13 DIAGNOSIS — R29898 Other symptoms and signs involving the musculoskeletal system: Secondary | ICD-10-CM

## 2023-10-13 DIAGNOSIS — R2681 Unsteadiness on feet: Secondary | ICD-10-CM

## 2023-10-13 DIAGNOSIS — R2689 Other abnormalities of gait and mobility: Secondary | ICD-10-CM

## 2023-10-13 NOTE — Therapy (Signed)
OUTPATIENT PHYSICAL THERAPY NEURO TREATMENT / DISCHARGE   Patient Name: Joshua [name] MRN: [id number] DOB:September 27, 1956, 67 y.o., male Today's Date: 10/13/2023   PCP:  Mohammed Kindle, PA REFERRING PROVIDER: Glean Salvo, NP  END OF SESSION:  PT End of Session - 10/13/23 1234     Visit Number 8    Number of Visits 9    Date for PT Re-Evaluation 10/18/23    Authorization Type [employer]    PT Start Time 1233    PT Stop Time 1258    PT Time Calculation (min) 25 min    Equipment Utilized During Treatment Gait belt    Activity Tolerance Patient tolerated treatment well    Behavior During Therapy WFL for tasks assessed/performed             Past Medical History:  Diagnosis Date   Acute coronary syndrome (HCC)    Anemia    B12 deficiency    Chest pain    CHF (congestive heart failure) (HCC)    Chronic combined systolic and diastolic CHF (congestive heart failure) (HCC)    a. 07/2015 Echo: EF 45-50%, mod LVH, mid apical/antsept AK, Gr 1 DD.   Chronic low back pain    CKD (chronic kidney disease), stage III (HCC)    Constipation    COPD (chronic obstructive pulmonary disease) (HCC)    Coronary artery disease    a. 2012 s/p MI/PCI: s/p DES to mid/dist RCA and overlapping DES to LAD;  b. 07/2015 Cath: LAD 10% ISR, D1 40(jailed), LCX 26m, OM2 30, OM3 30, RCA 24m ISR, 10d ISR; c. 07/2016 NSTEMI/PCI: LAD 85ost/p/m (3.5x20 Synergy DES overlapping prior stent), D1 40, LCX 33m, OM2 95 (2.75x16 Synergy DES), OM3 30, RCA 36m, 10d.   Depression    Diabetic gastroparesis (HCC)    Diabetic polyneuropathy (HCC)    DKA (diabetic ketoacidoses) 03/14/2018   GERD (gastroesophageal reflux disease)    Gout    Heart failure (HCC)    Heart murmur    History of MI (myocardial infarction)    Hyperlipemia    Hypertension    Hypertensive heart disease    Hypokalemia    Ischemic cardiomyopathy    a. 07/2015 Echo: EF 45-50%, mod LVH, mid-apicalanteroseptal AK, Gr1 DD.   Joint pain     Kidney problem    Morbid obesity (HCC)    OSA on CPAP    Osteoarthritis    Osteomyelitis (HCC)    a. 07/2016 MTP joint of R great toe s/p transmetatarsal amputation.   Other fatigue    Polyneuropathy in diabetes(357.2)    Pulmonary sarcoidosis (HCC)    "problems w/it years ago; no problems anymore" (07/03/2014)   Rectal bleeding    Shortness of breath    Shortness of breath on exertion    Sleep apnea    Stomach ulcer    Swallowing difficulty    Type II diabetes mellitus (HCC)    Past Surgical History:  Procedure Laterality Date   ABDOMINAL AORTOGRAM W/LOWER EXTREMITY N/A 11/29/2022   Procedure: ABDOMINAL AORTOGRAM W/LOWER EXTREMITY;  Surgeon: Maeola Harman, MD;  Location: [facility] INVASIVE CV LAB;  Service: Cardiovascular;  Laterality: N/A;   AMPUTATION Right 07/24/2016   Procedure: TRANSMETATARSAL FOOT AMPUTATION;  Surgeon: Nadara Mustard, MD;  Location: [facility] OR;  Service: Orthopedics;  Laterality: Right;   APPLICATION OF WOUND VAC Left 11/30/2022   Procedure: APPLICATION OF WOUND VAC;  Surgeon: Edwin Cap, DPM;  Location: [facility] OR;  Service: Podiatry;  Laterality: Left;   BALLOON DILATION N/A 03/21/2018   Procedure: BALLOON DILATION;  Surgeon: Bernette Redbird, MD;  Location: [facility] ENDOSCOPY;  Service: Endoscopy;  Laterality: N/A;   BIOPSY  03/16/2022   Procedure: BIOPSY;  Surgeon: Charlott Rakes, MD;  Location: [facility] ENDOSCOPY;  Service: Endoscopy;;   BREAST LUMPECTOMY Right ~ 2000   "benign"   CARDIAC CATHETERIZATION N/A 07/30/2015   Procedure: Left Heart Cath and Coronary Angiography;  Surgeon: Kathleene Hazel, MD;  Location: [facility] INVASIVE CV LAB;  Service: Cardiovascular;  Laterality: N/A;   CARDIAC CATHETERIZATION N/A 07/19/2016   Procedure: Left Heart Cath and Coronary Angiography;  Surgeon: Lyn Records, MD;  Location: [facility] INVASIVE CV LAB;  Service: Cardiovascular;  Laterality: N/A;   CARDIAC CATHETERIZATION N/A 07/29/2016   Procedure: Coronary Stent Intervention;   Surgeon: Lyn Records, MD;  Location: [facility] INVASIVE CV LAB;  Service: Cardiovascular;  Laterality: N/A;   CARDIAC CATHETERIZATION N/A 07/29/2016   Procedure: Intravascular Ultrasound/IVUS;  Surgeon: Lyn Records, MD;  Location: [facility] INVASIVE CV LAB;  Service: Cardiovascular;  Laterality: N/A;   CATARACT EXTRACTION W/ INTRAOCULAR LENS  IMPLANT, BILATERAL Bilateral 2014-2015   COLONOSCOPY WITH PROPOFOL N/A 08/22/2017   Procedure: COLONOSCOPY WITH PROPOFOL;  Surgeon: Charlott Rakes, MD;  Location: [facility] ENDOSCOPY;  Service: Endoscopy;  Laterality: N/A;   COLONOSCOPY WITH PROPOFOL N/A 03/16/2022   Procedure: COLONOSCOPY WITH PROPOFOL;  Surgeon: Charlott Rakes, MD;  Location: [facility] ENDOSCOPY;  Service: Endoscopy;  Laterality: N/A;   CORONARY ANGIOPLASTY WITH STENT PLACEMENT  10/31/2011   30 % MID ATRIOVENTRICULAR GROOVE CX STENOSIS. 90 % MID RCA.PTA TO LAD/STENTING OF TANDEM 60-70%. LAD STENOSIS 99% REDUCED TO 0%.3.0 X PROMUS DES POSTDILATED TO 3.25MM.   CORONARY ANGIOPLASTY WITH STENT PLACEMENT  07/03/2014   "2"   CORONARY STENT INTERVENTION N/A 04/09/2019   Procedure: CORONARY STENT INTERVENTION;  Surgeon: Corky Crafts, MD;  Location: [facility] INVASIVE CV LAB;  Service: Cardiovascular;  Laterality: N/A;   CORONARY STENT INTERVENTION N/A 08/22/2019   Procedure: CORONARY STENT INTERVENTION;  Surgeon: Swaziland, Peter M, MD;  Location: [facility] INVASIVE CV LAB;  Service: Cardiovascular;  Laterality: N/A;   ESOPHAGOGASTRODUODENOSCOPY (EGD) WITH PROPOFOL N/A 03/21/2018   Procedure: ESOPHAGOGASTRODUODENOSCOPY (EGD) WITH PROPOFOL;  Surgeon: Bernette Redbird, MD;  Location: [facility] ENDOSCOPY;  Service: Endoscopy;  Laterality: N/A;   ESOPHAGOGASTRODUODENOSCOPY (EGD) WITH PROPOFOL N/A 03/16/2022   Procedure: ESOPHAGOGASTRODUODENOSCOPY (EGD) WITH PROPOFOL;  Surgeon: Charlott Rakes, MD;  Location: [facility] ENDOSCOPY;  Service: Endoscopy;  Laterality: N/A;   GRAFT APPLICATION Left 11/30/2022   Procedure: GRAFT APPLICATION;  Surgeon:  Edwin Cap, DPM;  Location: [facility] OR;  Service: Podiatry;  Laterality: Left;   I & D EXTREMITY Right 10/30/2013   Procedure: IRRIGATION AND DEBRIDEMENT RIGHT SMALL FINGER POSSIBLE SECONDARY WOUND CLOSURE;  Surgeon: Marlowe Shores, MD;  Location: [facility] ORS;  Service: Orthopedics;  Laterality: Right;  Mina Marble is available after 4pm   I & D EXTREMITY Right 11/01/2013   Procedure:  IRRIGATION AND DEBRIDEMENT RIGHT  PROXIMAL PHALANGEAL LEVEL AMPUTATION;  Surgeon: Marlowe Shores, MD;  Location: [facility] ORS;  Service: Orthopedics;  Laterality: Right;   INCISION AND DRAINAGE Right 10/28/2013   Procedure: INCISION AND DRAINAGE Right small finger;  Surgeon: Marlowe Shores, MD;  Location: [facility] ORS;  Service: Orthopedics;  Laterality: Right;   INCISION AND DRAINAGE OF WOUND Left 11/07/2022   Procedure: IRRIGATION AND DEBRIDEMENT WOUND;  Surgeon: Pilar Plate, DPM;  Location: [facility] OR;  Service: Podiatry;  Laterality: Left;  Left foot ucler debridement and bone biopsy   LEFT HEART CATH Left 11/03/2011   Procedure: LEFT HEART CATH;  Surgeon: Lennette Bihari, MD;  Location: [facility] CATH LAB;  Service: Cardiovascular;  Laterality: Left;   LEFT HEART CATH AND CORONARY ANGIOGRAPHY N/A 04/09/2019   Procedure: LEFT HEART CATH AND CORONARY ANGIOGRAPHY;  Surgeon: Corky Crafts, MD;  Location: [facility] INVASIVE CV LAB;  Service: Cardiovascular;  Laterality: N/A;   LEFT HEART CATHETERIZATION WITH CORONARY ANGIOGRAM N/A 07/03/2014   Procedure: LEFT HEART CATHETERIZATION WITH CORONARY ANGIOGRAM;  Surgeon: Lesleigh Noe, MD;  Location: [facility] CATH LAB;  Service: Cardiovascular;  Laterality: N/A;   PERCUTANEOUS CORONARY STENT INTERVENTION (PCI-S) Left 11/03/2011   Procedure: PERCUTANEOUS CORONARY STENT INTERVENTION (PCI-S);  Surgeon: Lennette Bihari, MD;  Location: [facility] CATH LAB;  Service: Cardiovascular;  Laterality: Left;   RIGHT/LEFT HEART CATH AND CORONARY ANGIOGRAPHY N/A 08/22/2019   Procedure: RIGHT/LEFT HEART CATH  AND CORONARY ANGIOGRAPHY;  Surgeon: Swaziland, Peter M, MD;  Location: [facility] INVASIVE CV LAB;  Service: Cardiovascular;  Laterality: N/A;   TOE AMPUTATION Right ~ 2010   "2nd toe"   TOE AMPUTATION Right 2012   "3rd toe"   WOUND DEBRIDEMENT Left 11/30/2022   Procedure: DEBRIDEMENT WOUND;  Surgeon: Edwin Cap, DPM;  Location: [facility] OR;  Service: Podiatry;  Laterality: Left;   Patient Active Problem List   Diagnosis Date Noted   Vitamin D deficiency 07/28/2023   DKA (diabetic ketoacidosis) (HCC) 07/10/2023   Pharyngitis 07/10/2023   Lactic acidosis 07/10/2023   SIRS (systemic inflammatory response syndrome) (HCC) 07/10/2023   Elevated troponin 07/10/2023   Diabetic ulcer of left midfoot associated with diabetes mellitus due to underlying condition, with fat layer exposed (HCC) 12/02/2022   Acute osteomyelitis of metatarsal bone of left foot (HCC) 11/30/2022   Left foot pain 11/26/2022   Protein-calorie malnutrition, severe 11/08/2022   Osteomyelitis (HCC) 11/06/2022   Diabetic foot infection (HCC) 11/06/2022   Osteomyelitis of fifth toe of left foot (HCC) 11/05/2022   (HFpEF) heart failure with preserved ejection fraction (HCC) 11/05/2022   CKD (chronic kidney disease), stage IV (HCC) 11/05/2022   Dysphagia 03/16/2022   History of colonic polyps 03/16/2022   Spinal stenosis of lumbar region 11/03/2021   Mixed diabetic hyperlipidemia associated with type 2 diabetes mellitus (HCC) 08/11/2021   Chronic low back pain    Hypokalemia 09/25/2020   Insulin dependent type 2 diabetes mellitus (HCC) 09/24/2020   Unstable angina (HCC) 08/17/2019   Cardiac chest pain 08/16/2019   NSTEMI (non-ST elevated myocardial infarction) (HCC) 04/06/2019   ACS (acute coronary syndrome) (HCC) 04/06/2019   Sepsis (HCC) 03/15/2018   DKA (diabetic ketoacidosis) (HCC) 03/15/2018   Preop cardiovascular exam 08/22/2017   Acute renal failure superimposed on stage 3b chronic kidney disease (HCC) 05/19/2017    Essential hypertension    Status post transmetatarsal amputation of foot, right (HCC) 11/08/2016   Diabetic polyneuropathy associated with type 2 diabetes mellitus (HCC) 11/08/2016   Pure hypercholesterolemia    AKI (acute kidney injury) (HCC)    Acute blood loss anemia    DM2 (diabetes mellitus, type 2) (HCC)    Physical debility 08/03/2016   Chronic osteomyelitis of right foot (HCC)    Cellulitis and abscess of leg, except foot    Non-healing wound of left lower extremity    Stage 3b chronic kidney disease (CKD) (HCC)    Unstable angina pectoris (HCC) 07/18/2016   Chronic diastolic CHF (congestive heart failure) (HCC)  Chest pain with moderate risk for cardiac etiology 06/24/2014   CAD S/P multiple PCIs 06/24/2014   Sleep apnea- on C-pap 06/24/2014   Class 3 severe obesity with serious comorbidity and body mass index (BMI) of 45.0 to 49.9 in adult (HCC) 11/10/2013   Hyperglycemia 10/29/2013   Hyperlipidemia 01/12/2011   Benign essential HTN 01/12/2011   GERD (gastroesophageal reflux disease) 01/12/2011   Acute osteomyelitis of hindfoot, left (HCC) 01/12/2011    ONSET DATE: 08/17/2023 (referral date)  REFERRING DIAG: E11.42 (ICD-10-CM) - Diabetic polyneuropathy associated with type 2 diabetes mellitus (HCC)  THERAPY DIAG:  Muscular deconditioning  Unsteadiness on feet  Other abnormalities of gait and mobility  Rationale for Evaluation and Treatment: Rehabilitation  SUBJECTIVE:                                                                                                                                                                                             SUBJECTIVE STATEMENT: Patient denies any falls and near falls since last here. Reports that he has had a few episodes of feeling off balance at counter but has not had any falls.  Pt accompanied by: self  PERTINENT HISTORY: nonhealing ulcer of LLE, DMII, diabetic polyneuropathy   PAIN:  Are you having pain?  No  PRECAUTIONS: Fall and Other: post op shoe on LLE  RED FLAGS: None   WEIGHT BEARING RESTRICTIONS:  Requires post op boot on LLE due to wound  FALLS: Has patient fallen in last 6 months? Yes. Number of falls 10+ see above, foot rolling out primary cause  LIVING ENVIRONMENT: Lives with: lives with their family with son Lives in: Mobile home Stairs: Yes: External: 5-6 steps; can reach both Has following equipment at home: Walker - 2 wheeled  PLOF: Independent  PATIENT GOALS: "I just want to improve my balance; I feel like my legs have gotten really weak."   OBJECTIVE:   DIAGNOSTIC FINDINGS:  CT Head wo Contrast 12/08/2022: "IMPRESSION: Negative head CT. No acute intracranial abnormality identified.  COGNITION: Overall cognitive status: Within functional limits for tasks assessed   TODAY'S TREATMENT:  Assessed on LUE in sitting prior to interventions: Within limits from therapy and patient following up with internal medicine today as well.  Vitals:   10/13/23 1242  BP: (!) 156/95  Pulse: 86   Glucose 138 at onset of session.  TherAct (Goal Check):    [facility] PT Assessment - 10/13/23 0001       Standardized Balance Assessment   Standardized Balance Assessment Five Times Sit to Stand;Timed Up and Go Test;10 meter walk test    Five times sit to stand comments  19.25   seconds wihtout UE support (1x posterior instability that had to reset with SBA)   10 Meter Walk 0.72   m/s with 2WW + post op shoe (modI)     Timed Up and Go Test   Normal TUG (seconds) 13   second with 2WW (SBA)            PATIENT EDUCATION: Education details: Continue HEP Person educated: Patient Education method: Explanation Education comprehension: verbalized understanding and needs further education  HOME EXERCISE PROGRAM:  Access Code: [email or web address] URL:  [email or web address] Date: 09/20/2023 Prepared by: Maryruth Eve  Exercises - Standing Hip Abduction with Counter Support  - 1 x daily - 7 x weekly - 3 sets - 10 reps - Bird Dog on Counter  - 1 x daily - 7 x weekly - 3 sets - 10 reps - Standing Marching  - 1 x daily - 7 x weekly - 3 sets - 10 reps  GOALS: Goals reviewed with patient? Yes  LONG TERM GOALS: Target date: 10/18/2023 (STG = LTG due to POC length)   Patient will report demonstrate independence with final HEP in order to maintain current gains and continue to progress after physical therapy discharge.   Baseline: To be provided, reports confident in HEP Goal status: MET   2.  Patient will improve gait speed to 0.61 m/s to indicate progress towards the level of community ambulator in order to participate more easily in activities outside of the home.   Baseline: 0.51 m/s with 2WW + post op shoe; 0.72 m/s with 2WW + post op shoe Goal status: MET  3.  Patient will improve their 5x Sit to Stand score to less than 40 seconds to demonstrate a decreased risk for falls and improved LE strength.   Baseline: 52.15 without UE use, unstable coming to stand; 19.25 seconds without UE use Goal status: MET  4.  Patient will improve TUG score to 15 seconds or less to indicate progress towards a decreased risk of falls and demonstrate improved overall mobility.   Baseline: 19.34 with 2WW + post op shoe; 13 with 2WW + post op shoe Goal status: MET  ASSESSMENT:  CLINICAL IMPRESSION: Patient is discharged from skilled physical therapy due to maximal rehab potential at this time and achieving all LTGs. Patient would likely benefit from additional PT when post  op shoe is no longer required but have maximized gains at this time with safety of shoe. Recommend ongoing use of 2WW for safety as post op shoe provided minimal lateral stability. Patient demonstrates progress in decreased risk for falls as indicated in improvements on 5xSTS,  TUG, and gait speed. No further PT services indicated at this time.   OBJECTIVE IMPAIRMENTS: Abnormal gait, decreased balance, difficulty walking, decreased ROM, decreased strength, impaired sensation, and pain.   ACTIVITY LIMITATIONS: bending, squatting, transfers, reach over head, and locomotion level  PARTICIPATION LIMITATIONS: cleaning, community activity, and yard work  PERSONAL FACTORS: Past/current experiences,  Time since onset of injury/illness/exacerbation, and 3+ comorbidities: see above  are also affecting patient's functional outcome.   REHAB POTENTIAL: Fair given wound on LLE and noncompliance reported in today's session  CLINICAL DECISION MAKING: Stable/uncomplicated  EVALUATION COMPLEXITY: Low  PLAN:  PT FREQUENCY: 2x/week  PT DURATION: 4 weeks  PLANNED INTERVENTIONS: Therapeutic exercises, Therapeutic activity, Neuromuscular re-education, Balance training, Gait training, Patient/Family education, Self Care, DME instructions, and Re-evaluation  PLAN FOR NEXT SESSION: NA - D/C with updated HEP  Carmelia Bake, PT, DPT 10/13/2023, 1:09 PM

## 2023-10-13 NOTE — Patient Instructions (Signed)
Saket,  Don't forget to call the number on the back of your medicare card to ask about OTC benefits, to order a blood pressure cuff. You want an upper arm cuff, "omron" brand.  Check your blood pressure twice weekly, and any time you have concerning symptoms like headache, chest pain, dizziness, shortness of breath, or vision changes.   Our goal is at minimum less than 140/90, ideally < 130/80 if we can.  To appropriately check your blood pressure, make sure you do the following:  1) Avoid caffeine, exercise, or tobacco products for 30 minutes before checking. Empty your bladder. 2) Sit with your back supported in a flat-backed chair. Rest your arm on something flat (arm of the chair, table, etc). 3) Sit still with your feet flat on the floor, resting, for at least 5 minutes.  4) Check your blood pressure. Take 1-2 readings.  5) Write down these readings and bring with you to any provider appointments.  Bring your home blood pressure machine with you to a provider's office for accuracy comparison at least once a year.   Make sure you take your blood pressure medications before you come to any office visit, even if you were asked to fast for labs.   Please call the [facility] at [phone] to confirm your shipping address and provide a method of payment to place on file for future copays.   You can request prescription refills and request mail order through the MyChart App.

## 2023-10-13 NOTE — Progress Notes (Signed)
10/13/2023 Name: Joshua [name] MRN: [id number] DOB: 02-Jan-1956  Chief Complaint  Patient presents with   Hypertension    Joshua [name] is a 67 y.o. year old male who presented for a telephone visit.   They were referred to the pharmacist by their Case Management Team  for assistance in managing hypertension. Patient reports two Primary providers, [facility]urn[facility] and Dr Welton Flakes from [facility]    Subjective:  Care Team: Primary Care Provider: Gwenevere Abbot, MD  Medication Access/Adherence  Current Pharmacy:  [facility] [address] [place] [address] Phone: [phone] Fax: [phone]   Patient reports affordability concerns with their medications: No  Patient reports access/transportation concerns to their pharmacy: Yes does not have a car at moment, displaced from his residence and living with son. Challenge to actually pick up medications. He expresses interest in  Patient reports adherence concerns with their medications:  No    [address] [place], [place] [address]   Hypertension:  Current medications: losartan 25mg  daily, imdur 60mg  taking 2 tablets AM and 1 tablet PM, metoprolol XL 100mg  daily, furosemide 40mg  BID Last month has been SBP 150-170s, this is shocking to him because   Patient does not have a validated, automated, upper arm home BP cuff  Patient denies hypotensive s/sx including dizziness, lightheadedness.  Patient reports hypertensive symptoms including "foggy headed,"  Current meal patterns: stable per patient  Current physical activity: doing physical therapy   Objective:  Lab Results  Component Value Date   HGBA1C 7.8 (A) 04/28/2023    Lab Results  Component Value Date   CREATININE 1.91 (H) 07/13/2023   BUN 30 (H) 07/13/2023   NA 131 (L) 07/13/2023   K 4.0 07/13/2023   CL 95 (L) 07/13/2023   CO2 24 07/13/2023    Lab Results  Component Value Date   CHOL  106 11/30/2022   HDL 43 11/30/2022   LDLCALC 33 11/30/2022   TRIG 151 (H) 11/30/2022   CHOLHDL 2.5 11/30/2022    Medications Reviewed Today     Reviewed by Gabriel Carina, RPH (Pharmacist) on 10/13/23 at 1522  Med List Status: <None>   Medication Order Taking? Sig Documenting Provider Last Dose Status Informant  acetaminophen (TYLENOL) 325 MG tablet [id number] Yes Take 2 tablets (650 mg total) by mouth every 6 (six) hours as needed for mild pain (or Fever >/= 101). Gwenevere Abbot, MD Taking Active   allopurinol (ZYLOPRIM) 100 MG tablet [id number]  Take 1 tablet (100 mg total) by mouth daily. Gwenevere Abbot, MD  Expired 08/12/23 2359   aspirin EC 81 MG tablet [id number] Yes Take 1 tablet (81 mg total) by mouth daily. Gwenevere Abbot, MD Taking Active   atorvastatin (LIPITOR) 80 MG tablet [id number] Yes Take 1 tablet (80 mg total) by mouth daily. Carmina Miller, DO Taking Active   Blood Glucose Monitoring Suppl (BLOOD GLUCOSE MONITOR SYSTEM) w/Device KIT [id number] Yes Use in the morning, at noon, and at bedtime. Carmina Miller, DO Taking Active   Cholecalciferol (VITAMIN D) 50 MCG (2000 UT) tablet [id number] No Take 1 tablet (2,000 Units total) by mouth daily.  Patient not taking: Reported on 10/13/2023   Carmina Miller, DO Not Taking Active   Continuous Glucose Receiver (FREESTYLE LIBRE 3 READER) DEVI [id number] Yes Use as advised Carlus Pavlov, MD Taking Active   Continuous Glucose Sensor (FREESTYLE LIBRE 3 PLUS SENSOR) [place] [id number] Yes 1 each by Does not apply  route every 14 (fourteen) days. Carlus Pavlov, MD Taking Active   DULoxetine (CYMBALTA) 20 MG capsule [id number]  Take 1 capsule (20 mg total) by mouth 2 (two) times daily. Gwenevere Abbot, MD  Expired 08/12/23 2359   famotidine (PEPCID) 20 MG tablet [id number]  Take 1 tablet (20 mg total) by mouth at bedtime. Gwenevere Abbot, MD  Expired 08/12/23 2359   furosemide (LASIX) 40 MG tablet [id number]  Take 1 tablet (40 mg total) by mouth 2 (two) times  daily. Gwenevere Abbot, MD  Expired 08/12/23 2359   gabapentin (NEURONTIN) 100 MG capsule [id number] Yes Take 1 capsule by mouth at bedtime Gwenevere Abbot, MD Taking Active   insulin glargine (LANTUS SOLOSTAR) 100 UNIT/ML Solostar Pen [id number] Yes Inject 30 units under skin in the morning and 15 units at night Rocky Morel, DO Taking Active   insulin lispro (HUMALOG KWIKPEN) 100 UNIT/ML KwikPen [id number] Yes Inject 5-14 Units into the skin 3 (three) times daily with meals. Inject 5 units with meals, add the following per glucose level. CBG 70 - 120: 0 units CBG 121 - 150: 1 unit CBG 151 - 200: 2 units CBG 201 - 250: 3 units CBG 251 - 300: 5 units CBG 301 - 350: 7 units CBG 351 - 400: 9 units Carmina Miller, DO Taking Active   Insulin Pen Needle 32G X 4 MM MISC [id number] Yes Use with Lantus  Taking Active   isosorbide mononitrate (IMDUR) 60 MG 24 hr tablet [id number] Yes Take 2 tablets (120mg )  in the morning and 1 tablet (60mg ) in the evening. Gwenevere Abbot, MD Taking Active   metoprolol succinate (TOPROL-XL) 100 MG 24 hr tablet [id number] Yes Take 100 mg by mouth daily. Take with or immediately following a meal. [provider] Taking Active   nitroGLYCERIN (NITROSTAT) 0.4 MG SL tablet [id number] Yes Place 1 tablet (0.4 mg total) under the tongue every 5 (five) minutes as needed for chest pain. Gwenevere Abbot, MD Taking Active   pantoprazole (PROTONIX) 40 MG tablet [id number]  Take 1 tablet (40 mg total) by mouth 2 (two) times daily. Gwenevere Abbot, MD  Expired 08/12/23 2359   Semaglutide, 1 MG/DOSE, (OZEMPIC, 1 MG/DOSE,) 4 MG/3ML SOPN [id number] Yes Inject 1 mg into the skin once a week. Carlus Pavlov, MD Taking Active   ticagrelor (BRILINTA) 90 MG TABS tablet [id number] Yes Take 1 tablet (90 mg total) by mouth 2 (two) times daily. Gwenevere Abbot, MD Taking Active   Vitamin D, Ergocalciferol, (DRISDOL) 1.25 MG (50000 UNIT) CAPS capsule [id number] Yes Take 50,000 Units by mouth every 7 (seven) days.  [provider] Taking Active               Assessment/Plan:   Hypertension: - Currently uncontrolled - Recommend increase losartan dosing as tolerated, recommend BMP for renal function and electrolyte trends. Of note, losartan is prescribed by [facility] provider. Educated patient on the importance of reviewing medication lists at each office visit to avoid errors or duplications since patient has multiple providers. He verbalized understanding. - Reviewed appropriate blood pressure monitoring technique and reviewed goal blood pressure. Recommended to check home blood pressure and heart rate by obtaining home BP cuff using number on back of medicare insurance card for OTC benefits - Patient expressed that traveling to pharmacy was a challenge for him at present, does not have a car. He is interested in mail delivery via [facility]. Will facilitate with PCP and [facility].   Follow Up Plan: 2-4 weeks   Lynnda Shields,  PharmD, BCPS Clinical Pharmacist [facility]

## 2023-10-15 ENCOUNTER — Other Ambulatory Visit (HOSPITAL_COMMUNITY): Payer: Self-pay

## 2023-10-15 ENCOUNTER — Other Ambulatory Visit: Payer: Self-pay | Admitting: Internal Medicine

## 2023-10-15 MED ORDER — GABAPENTIN 100 MG PO CAPS
100.0000 mg | ORAL_CAPSULE | Freq: Every day | ORAL | 0 refills | Status: DC
  Filled 2023-10-15 – 2024-03-31 (×4): qty 30, 30d supply, fill #0

## 2023-10-15 MED ORDER — ISOSORBIDE MONONITRATE ER 60 MG PO TB24
ORAL_TABLET | ORAL | 3 refills | Status: DC
  Filled 2023-10-15 – 2023-11-23 (×2): qty 270, 90d supply, fill #0

## 2023-10-15 MED ORDER — TICAGRELOR 90 MG PO TABS
90.0000 mg | ORAL_TABLET | Freq: Two times a day (BID) | ORAL | 3 refills | Status: AC
  Filled 2023-10-15 – 2024-01-01 (×3): qty 180, 90d supply, fill #0
  Filled 2024-03-31: qty 180, 90d supply, fill #1
  Filled 2024-07-31: qty 180, 90d supply, fill #2

## 2023-10-15 MED ORDER — LATANOPROST 0.005 % OP SOLN
1.0000 [drp] | Freq: Every day | OPHTHALMIC | 1 refills | Status: AC
  Filled 2023-10-15: qty 2.5, 25d supply, fill #0
  Filled 2023-11-23 – 2024-03-31 (×2): qty 7.5, 75d supply, fill #0
  Filled 2024-07-31 – 2024-08-01 (×2): qty 7.5, 75d supply, fill #1

## 2023-10-15 NOTE — Progress Notes (Signed)
Spoke with patient regarding his prescriptions. He stated he was out of 4 prescriptions. I went through his medication list and he had other medications. I sent in refills on the medications he is out of. Pt states his PCP is at [facility], [employer], so I advised him to keep only one PCP. I will process his refill request at this time but advised for all future refills to contact his PCP office.   Gwenevere Abbot, MD Eligha Bridegroom. [facility], PGY-3

## 2023-10-17 ENCOUNTER — Other Ambulatory Visit (HOSPITAL_COMMUNITY): Payer: Self-pay

## 2023-10-18 ENCOUNTER — Other Ambulatory Visit: Payer: Self-pay | Admitting: *Deleted

## 2023-10-18 DIAGNOSIS — S81802A Unspecified open wound, left lower leg, initial encounter: Secondary | ICD-10-CM

## 2023-10-25 ENCOUNTER — Encounter: Payer: Self-pay | Admitting: Podiatry

## 2023-10-25 ENCOUNTER — Ambulatory Visit: Payer: 59 | Admitting: Podiatry

## 2023-10-25 DIAGNOSIS — L97522 Non-pressure chronic ulcer of other part of left foot with fat layer exposed: Secondary | ICD-10-CM

## 2023-10-25 DIAGNOSIS — M21961 Unspecified acquired deformity of right lower leg: Secondary | ICD-10-CM

## 2023-10-25 DIAGNOSIS — M21962 Unspecified acquired deformity of left lower leg: Secondary | ICD-10-CM

## 2023-10-26 ENCOUNTER — Ambulatory Visit (HOSPITAL_COMMUNITY)
Admission: RE | Admit: 2023-10-26 | Discharge: 2023-10-26 | Disposition: A | Discharge status: Home / Self Care | Payer: 59 | Source: Ambulatory Visit | Attending: Vascular Surgery | Admitting: Vascular Surgery

## 2023-10-26 ENCOUNTER — Ambulatory Visit (INDEPENDENT_AMBULATORY_CARE_PROVIDER_SITE_OTHER): Payer: 59 | Admitting: Vascular Surgery

## 2023-10-26 ENCOUNTER — Encounter: Payer: Self-pay | Admitting: Vascular Surgery

## 2023-10-26 VITALS — BP 159/95 | HR 81 | Temp 98.0°F | Resp 20 | Ht 69.0 in | Wt 258.8 lb

## 2023-10-26 DIAGNOSIS — I7025 Atherosclerosis of native arteries of other extremities with ulceration: Secondary | ICD-10-CM

## 2023-10-26 DIAGNOSIS — S81802A Unspecified open wound, left lower leg, initial encounter: Secondary | ICD-10-CM | POA: Diagnosis not present

## 2023-10-26 LAB — VAS US ABI WITH/WO TBI: Left ABI: 1.15

## 2023-10-26 NOTE — Progress Notes (Signed)
Patient ID: Joshua [name], male   DOB: Apr 21, 1956, 67 y.o.   MRN: [id number]  Reason for Consult: New Patient (Initial Visit)   Referred by Edwin Cap, DPM  Subjective:     HPI:  Joshua [name] is a 67 y.o. male with history of ulceration of his foot which healed after angiography although no intervention was undertaken.  More recently in June he was found to have a small piece of glass embedded in his left foot and ultimately had this removed and developed a 7 mm ulceration which is now reduced to a size of approximately 3 mm from their pictures in the chart for review although he has a dressing on today.  He states that it is getting much better.  He is walking with the boot and the help of a walker at this time.  No fevers or chills.  He continues on aspirin and a statin.  Past Medical History:  Diagnosis Date   Acute coronary syndrome (HCC)    Anemia    B12 deficiency    Chest pain    CHF (congestive heart failure) (HCC)    Chronic combined systolic and diastolic CHF (congestive heart failure) (HCC)    a. 07/2015 Echo: EF 45-50%, mod LVH, mid apical/antsept AK, Gr 1 DD.   Chronic low back pain    CKD (chronic kidney disease), stage III (HCC)    Constipation    COPD (chronic obstructive pulmonary disease) (HCC)    Coronary artery disease    a. 2012 s/p MI/PCI: s/p DES to mid/dist RCA and overlapping DES to LAD;  b. 07/2015 Cath: LAD 10% ISR, D1 40(jailed), LCX 14m, OM2 30, OM3 30, RCA 77m ISR, 10d ISR; c. 07/2016 NSTEMI/PCI: LAD 85ost/p/m (3.5x20 Synergy DES overlapping prior stent), D1 40, LCX 83m, OM2 95 (2.75x16 Synergy DES), OM3 30, RCA 72m, 10d.   Depression    Diabetic gastroparesis (HCC)    Diabetic polyneuropathy (HCC)    DKA (diabetic ketoacidoses) 03/14/2018   GERD (gastroesophageal reflux disease)    Gout    Heart failure (HCC)    Heart murmur    History of MI (myocardial infarction)    Hyperlipemia    Hypertension    Hypertensive heart disease     Hypokalemia    Ischemic cardiomyopathy    a. 07/2015 Echo: EF 45-50%, mod LVH, mid-apicalanteroseptal AK, Gr1 DD.   Joint pain    Kidney problem    Morbid obesity (HCC)    OSA on CPAP    Osteoarthritis    Osteomyelitis (HCC)    a. 07/2016 MTP joint of R great toe s/p transmetatarsal amputation.   Other fatigue    Polyneuropathy in diabetes(357.2)    Pulmonary sarcoidosis (HCC)    "problems w/it years ago; no problems anymore" (07/03/2014)   Rectal bleeding    Shortness of breath    Shortness of breath on exertion    Sleep apnea    Stomach ulcer    Swallowing difficulty    Type II diabetes mellitus (HCC)    Family History  Problem Relation Age of Onset   Sudden death Mother    Heart attack Maternal Aunt    Diabetes Maternal Grandmother    Hypertension Maternal Grandmother    Sudden death Other    Neuropathy Neg Hx    Past Surgical History:  Procedure Laterality Date   ABDOMINAL AORTOGRAM W/LOWER EXTREMITY N/A 11/29/2022   Procedure: ABDOMINAL AORTOGRAM W/LOWER EXTREMITY;  Surgeon: Maeola Harman, MD;  [facility]: [facility] INVASIVE CV LAB;  Service: Cardiovascular;  Laterality: N/A;   AMPUTATION Right 07/24/2016   Procedure: TRANSMETATARSAL FOOT AMPUTATION;  Surgeon: Nadara Mustard, MD;  Location: [facility] OR;  Service: Orthopedics;  Laterality: Right;   APPLICATION OF WOUND VAC Left 11/30/2022   Procedure: APPLICATION OF WOUND VAC;  Surgeon: Edwin Cap, DPM;  Location: [facility] OR;  Service: Podiatry;  Laterality: Left;   BALLOON DILATION N/A 03/21/2018   Procedure: BALLOON DILATION;  Surgeon: Bernette Redbird, MD;  Location: [facility] ENDOSCOPY;  Service: Endoscopy;  Laterality: N/A;   BIOPSY  03/16/2022   Procedure: BIOPSY;  Surgeon: Charlott Rakes, MD;  Location: [facility] ENDOSCOPY;  Service: Endoscopy;;   BREAST LUMPECTOMY Right ~ 2000   "benign"   CARDIAC CATHETERIZATION N/A 07/30/2015   Procedure: Left Heart Cath and Coronary Angiography;  Surgeon: Kathleene Hazel, MD;  Location: [facility]  INVASIVE CV LAB;  Service: Cardiovascular;  Laterality: N/A;   CARDIAC CATHETERIZATION N/A 07/19/2016   Procedure: Left Heart Cath and Coronary Angiography;  Surgeon: Lyn Records, MD;  Location: [facility] INVASIVE CV LAB;  Service: Cardiovascular;  Laterality: N/A;   CARDIAC CATHETERIZATION N/A 07/29/2016   Procedure: Coronary Stent Intervention;  Surgeon: Lyn Records, MD;  Location: [facility] INVASIVE CV LAB;  Service: Cardiovascular;  Laterality: N/A;   CARDIAC CATHETERIZATION N/A 07/29/2016   Procedure: Intravascular Ultrasound/IVUS;  Surgeon: Lyn Records, MD;  Location: [facility] INVASIVE CV LAB;  Service: Cardiovascular;  Laterality: N/A;   CATARACT EXTRACTION W/ INTRAOCULAR LENS  IMPLANT, BILATERAL Bilateral 2014-2015   COLONOSCOPY WITH PROPOFOL N/A 08/22/2017   Procedure: COLONOSCOPY WITH PROPOFOL;  Surgeon: Charlott Rakes, MD;  Location: [facility] ENDOSCOPY;  Service: Endoscopy;  Laterality: N/A;   COLONOSCOPY WITH PROPOFOL N/A 03/16/2022   Procedure: COLONOSCOPY WITH PROPOFOL;  Surgeon: Charlott Rakes, MD;  Location: [facility] ENDOSCOPY;  Service: Endoscopy;  Laterality: N/A;   CORONARY ANGIOPLASTY WITH STENT PLACEMENT  10/31/2011   30 % MID ATRIOVENTRICULAR GROOVE CX STENOSIS. 90 % MID RCA.PTA TO LAD/STENTING OF TANDEM 60-70%. LAD STENOSIS 99% REDUCED TO 0%.3.0 X PROMUS DES POSTDILATED TO 3.25MM.   CORONARY ANGIOPLASTY WITH STENT PLACEMENT  07/03/2014   "2"   CORONARY STENT INTERVENTION N/A 04/09/2019   Procedure: CORONARY STENT INTERVENTION;  Surgeon: Corky Crafts, MD;  Location: [facility] INVASIVE CV LAB;  Service: Cardiovascular;  Laterality: N/A;   CORONARY STENT INTERVENTION N/A 08/22/2019   Procedure: CORONARY STENT INTERVENTION;  Surgeon: Swaziland, Peter M, MD;  Location: [facility] INVASIVE CV LAB;  Service: Cardiovascular;  Laterality: N/A;   ESOPHAGOGASTRODUODENOSCOPY (EGD) WITH PROPOFOL N/A 03/21/2018   Procedure: ESOPHAGOGASTRODUODENOSCOPY (EGD) WITH PROPOFOL;  Surgeon: Bernette Redbird, MD;  Location: [facility]  ENDOSCOPY;  Service: Endoscopy;  Laterality: N/A;   ESOPHAGOGASTRODUODENOSCOPY (EGD) WITH PROPOFOL N/A 03/16/2022   Procedure: ESOPHAGOGASTRODUODENOSCOPY (EGD) WITH PROPOFOL;  Surgeon: Charlott Rakes, MD;  Location: [facility] ENDOSCOPY;  Service: Endoscopy;  Laterality: N/A;   GRAFT APPLICATION Left 11/30/2022   Procedure: GRAFT APPLICATION;  Surgeon: Edwin Cap, DPM;  Location: [facility] OR;  Service: Podiatry;  Laterality: Left;   I & D EXTREMITY Right 10/30/2013   Procedure: IRRIGATION AND DEBRIDEMENT RIGHT SMALL FINGER POSSIBLE SECONDARY WOUND CLOSURE;  Surgeon: Marlowe Shores, MD;  Location: [facility] ORS;  Service: Orthopedics;  Laterality: Right;  Mina Marble is available after 4pm   I & D EXTREMITY Right 11/01/2013   Procedure:  IRRIGATION AND DEBRIDEMENT RIGHT  PROXIMAL PHALANGEAL LEVEL AMPUTATION;  Surgeon: Marlowe Shores, MD;  Location: [facility] ORS;  Service: Orthopedics;  Laterality: Right;   INCISION AND DRAINAGE Right 10/28/2013   Procedure: INCISION AND DRAINAGE Right small finger;  Surgeon: Marlowe Shores, MD;  Location: [facility] ORS;  Service: Orthopedics;  Laterality: Right;   INCISION AND DRAINAGE OF WOUND Left 11/07/2022   Procedure: IRRIGATION AND DEBRIDEMENT WOUND;  Surgeon: Pilar Plate, DPM;  Location: [facility] OR;  Service: Podiatry;  Laterality: Left;  Left foot ucler debridement and bone biopsy   LEFT HEART CATH Left 11/03/2011   Procedure: LEFT HEART CATH;  Surgeon: Lennette Bihari, MD;  Location: [facility] CATH LAB;  Service: Cardiovascular;  Laterality: Left;   LEFT HEART CATH AND CORONARY ANGIOGRAPHY N/A 04/09/2019   Procedure: LEFT HEART CATH AND CORONARY ANGIOGRAPHY;  Surgeon: Corky Crafts, MD;  Location: [facility] INVASIVE CV LAB;  Service: Cardiovascular;  Laterality: N/A;   LEFT HEART CATHETERIZATION WITH CORONARY ANGIOGRAM N/A 07/03/2014   Procedure: LEFT HEART CATHETERIZATION WITH CORONARY ANGIOGRAM;  Surgeon: Lesleigh Noe, MD;  Location: [facility] CATH LAB;  Service:  Cardiovascular;  Laterality: N/A;   PERCUTANEOUS CORONARY STENT INTERVENTION (PCI-S) Left 11/03/2011   Procedure: PERCUTANEOUS CORONARY STENT INTERVENTION (PCI-S);  Surgeon: Lennette Bihari, MD;  Location: [facility] CATH LAB;  Service: Cardiovascular;  Laterality: Left;   RIGHT/LEFT HEART CATH AND CORONARY ANGIOGRAPHY N/A 08/22/2019   Procedure: RIGHT/LEFT HEART CATH AND CORONARY ANGIOGRAPHY;  Surgeon: Swaziland, Peter M, MD;  Location: [facility] INVASIVE CV LAB;  Service: Cardiovascular;  Laterality: N/A;   TOE AMPUTATION Right ~ 2010   "2nd toe"   TOE AMPUTATION Right 2012   "3rd toe"   WOUND DEBRIDEMENT Left 11/30/2022   Procedure: DEBRIDEMENT WOUND;  Surgeon: Edwin Cap, DPM;  Location: [facility] OR;  Service: Podiatry;  Laterality: Left;    Short Social History:  Social History   Tobacco Use   Smoking status: Never   Smokeless tobacco: Never  Substance Use Topics   Alcohol use: Yes    Comment: beer - 3 to 4 times a week.    Allergies  Allergen Reactions   Chlorthalidone Other (See Comments)    Causes dehydration, kidneys shut down    Current Outpatient Medications  Medication Sig Dispense Refill   acetaminophen (TYLENOL) 325 MG tablet Take 2 tablets (650 mg total) by mouth every 6 (six) hours as needed for mild pain (or Fever >/= 101).     aspirin EC 81 MG tablet Take 1 tablet (81 mg total) by mouth daily. 30 tablet 0   atorvastatin (LIPITOR) 80 MG tablet Take 1 tablet (80 mg total) by mouth daily. 30 tablet 3   Blood Glucose Monitoring Suppl (BLOOD GLUCOSE MONITOR SYSTEM) w/Device KIT Use in the morning, at noon, and at bedtime. 1 kit 0   Cholecalciferol (VITAMIN D) 50 MCG (2000 UT) tablet Take 1 tablet (2,000 Units total) by mouth daily. 30 tablet 3   Continuous Glucose Receiver (FREESTYLE LIBRE 3 READER) DEVI Use as advised 1 each 0   Continuous Glucose Sensor (FREESTYLE LIBRE 3 PLUS SENSOR) MISC 1 each by Does not apply route every 14 (fourteen) days. 6 each 3   gabapentin (NEURONTIN) 100  MG capsule Take 1 capsule (100 mg total) by mouth at bedtime. 30 capsule 0   insulin glargine (LANTUS SOLOSTAR) 100 UNIT/ML Solostar Pen Inject 30 units under skin in the morning and 15 units at night 15 mL 3   insulin lispro (HUMALOG KWIKPEN) 100 UNIT/ML KwikPen Inject 5-14 Units into the skin 3 (three) times daily with meals. Inject 5 units  with meals, add the following per glucose level. CBG 70 - 120: 0 units CBG 121 - 150: 1 unit CBG 151 - 200: 2 units CBG 201 - 250: 3 units CBG 251 - 300: 5 units CBG 301 - 350: 7 units CBG 351 - 400: 9 units 15 mL 11   Insulin Pen Needle 32G X 4 MM MISC Use with Lantus 100 each 0   isosorbide mononitrate (IMDUR) 60 MG 24 hr tablet Take 2 tablets by mouth in the morning AND 1 tablet every evening. 270 tablet 3   latanoprost (XALATAN) 0.005 % ophthalmic solution Place 1 drop into both eyes at bedtime. 7.5 mL 1   losartan (COZAAR) 25 MG tablet Take 25 mg by mouth daily.     metoprolol succinate (TOPROL-XL) 100 MG 24 hr tablet Take 100 mg by mouth daily. Take with or immediately following a meal.     nitroGLYCERIN (NITROSTAT) 0.4 MG SL tablet Place 1 tablet (0.4 mg total) under the tongue every 5 (five) minutes as needed for chest pain. 25 tablet 3   Semaglutide, 1 MG/DOSE, (OZEMPIC, 1 MG/DOSE,) 4 MG/3ML SOPN Inject 1 mg into the skin once a week. 9 mL 3   ticagrelor (BRILINTA) 90 MG TABS tablet Take 1 tablet (90 mg total) by mouth 2 (two) times daily. 180 tablet 3   Vitamin D, Ergocalciferol, (DRISDOL) 1.25 MG (50000 UNIT) CAPS capsule Take 50,000 Units by mouth every 7 (seven) days.     allopurinol (ZYLOPRIM) 100 MG tablet Take 1 tablet (100 mg total) by mouth daily. 30 tablet 0   DULoxetine (CYMBALTA) 20 MG capsule Take 1 capsule (20 mg total) by mouth 2 (two) times daily. 60 capsule 0   famotidine (PEPCID) 20 MG tablet Take 1 tablet (20 mg total) by mouth at bedtime. 30 tablet 0   furosemide (LASIX) 40 MG tablet Take 1 tablet (40 mg total) by mouth 2 (two) times  daily. 60 tablet 0   pantoprazole (PROTONIX) 40 MG tablet Take 1 tablet (40 mg total) by mouth 2 (two) times daily. 60 tablet 0   No current facility-administered medications for this visit.    Review of Systems  Constitutional:  Constitutional negative. HENT: HENT negative.  Eyes: Eyes negative.  Cardiovascular: Positive for leg swelling.  GI: Gastrointestinal negative.  Musculoskeletal: Musculoskeletal negative.  Skin: Positive for wound.  Neurological: Neurological negative. Hematologic: Hematologic/lymphatic negative.  Psychiatric: Psychiatric negative.        Objective:  Objective   Vitals:   10/26/23 1352  BP: (!) 159/95  Pulse: 81  Resp: 20  Temp: 98 F (36.7 C)  SpO2: 98%  Weight: 258 lb 12.8 oz (117.4 kg)  Height: 5\' 9"  (1.753 m)   Body mass index is 38.22 kg/m.  Physical Exam Constitutional:      Appearance: He is obese.  HENT:     Mouth/Throat:     Mouth: Mucous membranes are moist.  Eyes:     Pupils: Pupils are equal, round, and reactive to light.  Cardiovascular:     Rate and Rhythm: Normal rate.     Pulses:          Dorsalis pedis pulses are 0 on the left side.       Posterior tibial pulses are 0 on the left side.  Pulmonary:     Effort: Pulmonary effort is normal.  Abdominal:     General: Abdomen is flat.  Musculoskeletal:     Right lower leg: No edema.  Left lower leg: Edema present.  Skin:    Capillary Refill: Capillary refill takes less than 2 seconds.  Neurological:     General: No focal deficit present.     Mental Status: He is alert.  Psychiatric:        Mood and Affect: Mood normal.        Thought Content: Thought content normal.        Judgment: Judgment normal.     Data: ABI Findings:  +--------+------------------+-----+---------+--------+  Right  Rt Pressure (mmHg)IndexWaveform Comment   +--------+------------------+-----+---------+--------+  [id number]                                       +--------+------------------+-----+---------+--------+  ATA    255               1.45 triphasic          +--------+------------------+-----+---------+--------+  PTA    255               1.45 triphasic          +--------+------------------+-----+---------+--------+   +---------+------------------+-----+----------+-------+  Left    Lt Pressure (mmHg)IndexWaveform  Comment  +---------+------------------+-----+----------+-------+  Brachial 164                                       +---------+------------------+-----+----------+-------+  PTA     202               1.15 monophasic         +---------+------------------+-----+----------+-------+  DP      192               1.09 monophasic         +---------+------------------+-----+----------+-------+  Great Toe91                0.52                    +---------+------------------+-----+----------+-------+   +-------+-----------+-----------+------------+------------+  ABI/TBIToday's ABIToday's TBIPrevious ABIPrevious TBI  +-------+-----------+-----------+------------+------------+  Right [place]         amp        1.28        amp           +-------+-----------+-----------+------------+------------+  Left  1.15       0.52       1.15        0.7           +-------+-----------+-----------+------------+------------+        Arterial wall calcification precludes accurate ankle pressures and ABIs.  Previous ABI pm 11/01/22.    Summary:  Right: Resting right ankle-brachial index indicates noncompressible right  lower extremity arteries.   Left: Resting left ankle-brachial index is within normal range. The left  toe-brachial index is abnormal.      Assessment/Plan:     68 year old male with ulceration of his left foot secondary to foreign body which is now healing appears more like a diabetic ulcer at this time and he should have a adequate pressure via the posterior tibial artery for  healing.  I cannot reliably feel any pulses today although there is some edema limiting.  We have discussed waiting on angiography given that his foot is healing and that he has essentially normal angiography from a year ago he was agreeable to follow-up in approximately 1 month  with left lower extremity arterial duplex for further evaluation.  Certainly if the wound worsens either in that time or before that time we would have to consider repeat angiography from right common femoral approach.     Maeola Harman MD [facility]

## 2023-10-27 ENCOUNTER — Encounter: Payer: 59 | Admitting: Student

## 2023-10-27 NOTE — Progress Notes (Signed)
  Subjective:  Patient ID: Joshua [name], male    DOB: 1956-02-01,  MRN: [id number]  Chief Complaint  Patient presents with   Diabetic Ulcer    Follow up on foot ulcer left foot patient states is doing well has no concerns at this time.    67 y.o. male presents with the above complaint. History confirmed with patient.  He returns for follow-up, he has been using the Prisma   Objective:  Physical Exam: warm, good capillary refill, weakly palpable DP and PT pulses, and diffuse peripheral neuropathy, full-thickness ulceration plantar cuboid area measuring 0.3 x 0.3 time 0.2 cm.  Exposed subcutaneous tissue with surrounding hyperkeratosis.  No signs of infection no exposed bone tendon or joint.  Well-healed transmetatarsal amputation on the right foot..    Assessment:   1. Ulcer of left foot with fat layer exposed (HCC)   2. Acquired deformity of both feet        Plan:  Patient was evaluated and treated and all questions answered.    Ulcer left foot -We discussed the etiology and factors that are a part of the wound healing process.  We also discussed the risk of infection both soft tissue and osteomyelitis from open ulceration.  Discussed the risk of limb loss if this happens or worsens. -Debridement as below.  Significant improvement today, expect should be fully healed next visit -Dressed with Iodosorb, DSD. -Continue home dressing changes  3 times weekly with Prisma and bandage -Continue off-loading with surgical shoe and peg assist device. -HgbA1c: 7.8% in May of this year -His position seems to be maintaining, if ulcer threatens to recur or persistent callus here may need TA transfer and TAL -He was evaluated by vascular surgery and they feel that he has adequate perfusion to heal his wounds   Procedure: Excisional Debridement of Wound Rationale: Removal of non-viable soft tissue from the wound to promote healing.  Anesthesia: none Post-Debridement Wound Measurements:  Noted above Type of Debridement: Sharp Excisional Tissue Removed: Non-viable soft tissue Depth of Debridement: subcutaneous tissue. Technique: Sharp excisional debridement to bleeding, viable wound base.  Dressing: Dry, sterile, compression dressing. Disposition: Patient tolerated procedure well.      Return in about 3 weeks (around 11/15/2023) for wound care.

## 2023-10-28 ENCOUNTER — Other Ambulatory Visit (HOSPITAL_COMMUNITY): Payer: Self-pay

## 2023-11-04 ENCOUNTER — Other Ambulatory Visit: Payer: Self-pay

## 2023-11-04 DIAGNOSIS — I7025 Atherosclerosis of native arteries of other extremities with ulceration: Secondary | ICD-10-CM

## 2023-11-07 ENCOUNTER — Other Ambulatory Visit (HOSPITAL_COMMUNITY): Payer: Self-pay

## 2023-11-15 ENCOUNTER — Ambulatory Visit (INDEPENDENT_AMBULATORY_CARE_PROVIDER_SITE_OTHER): Payer: 59 | Admitting: Podiatry

## 2023-11-15 ENCOUNTER — Encounter: Payer: Self-pay | Admitting: Podiatry

## 2023-11-15 DIAGNOSIS — L97521 Non-pressure chronic ulcer of other part of left foot limited to breakdown of skin: Secondary | ICD-10-CM

## 2023-11-15 NOTE — Progress Notes (Signed)
  Subjective:  Patient ID: Joshua [name], male    DOB: 06-22-1956,  MRN: [id number]  Chief Complaint  Patient presents with   Routine Post Op    PATIENT STATES EVERYTHING HAS BEEN GOOD SINCE LAST VISIT , JUST A LITTLE BIT OF PAIN AT THE BOTTOM AND SIDE OF L FOOT NOTHING TO CRAZY .    67 y.o. male presents with the above complaint. History confirmed with patient.  He returns for follow-up, wound seems to be healed he has not had any drainage  Objective:  Physical Exam: warm, good capillary refill, weakly palpable DP and PT pulses, and diffuse peripheral neuropathy, ulceration has fully healed no active ulceration or drainage.    Assessment:   1. Ulcer of left foot, limited to breakdown of skin [facility])        Plan:  Patient was evaluated and treated and all questions answered.    Ulcer left foot Doing very well does not need to bandage the foot anymore or apply Prisma he can bathe regularly and apply Neosporin and or a moisturizing lotion.  He may return to his diabetic shoes he had these with him today and I inspected them and modified his insert in the area of pressure to offload to reduce his risk of recurrence of the ulceration.  He will follow-up in 1 month for a wound check.  He will bring his remaining inserts at that time and we will modify these as well so he has some for the future.  He will let me know if he has any issues before this and check his feet daily.     Return in about 5 weeks (around 12/20/2023) for wound check .

## 2023-11-17 DIAGNOSIS — H35033 Hypertensive retinopathy, bilateral: Secondary | ICD-10-CM | POA: Diagnosis not present

## 2023-11-17 DIAGNOSIS — E113493 Type 2 diabetes mellitus with severe nonproliferative diabetic retinopathy without macular edema, bilateral: Secondary | ICD-10-CM | POA: Diagnosis not present

## 2023-11-17 DIAGNOSIS — N1832 Chronic kidney disease, stage 3b: Secondary | ICD-10-CM | POA: Diagnosis not present

## 2023-11-17 DIAGNOSIS — I10 Essential (primary) hypertension: Secondary | ICD-10-CM | POA: Diagnosis not present

## 2023-11-17 DIAGNOSIS — Z0000 Encounter for general adult medical examination without abnormal findings: Secondary | ICD-10-CM | POA: Diagnosis not present

## 2023-11-17 DIAGNOSIS — M109 Gout, unspecified: Secondary | ICD-10-CM | POA: Diagnosis not present

## 2023-11-17 DIAGNOSIS — N2581 Secondary hyperparathyroidism of renal origin: Secondary | ICD-10-CM | POA: Diagnosis not present

## 2023-11-17 DIAGNOSIS — I2510 Atherosclerotic heart disease of native coronary artery without angina pectoris: Secondary | ICD-10-CM | POA: Diagnosis not present

## 2023-11-17 DIAGNOSIS — I130 Hypertensive heart and chronic kidney disease with heart failure and stage 1 through stage 4 chronic kidney disease, or unspecified chronic kidney disease: Secondary | ICD-10-CM | POA: Diagnosis not present

## 2023-11-17 DIAGNOSIS — E7800 Pure hypercholesterolemia, unspecified: Secondary | ICD-10-CM | POA: Diagnosis not present

## 2023-11-17 DIAGNOSIS — I5022 Chronic systolic (congestive) heart failure: Secondary | ICD-10-CM | POA: Diagnosis not present

## 2023-11-17 DIAGNOSIS — Z23 Encounter for immunization: Secondary | ICD-10-CM | POA: Diagnosis not present

## 2023-11-23 ENCOUNTER — Other Ambulatory Visit (HOSPITAL_COMMUNITY): Payer: Self-pay

## 2023-11-23 ENCOUNTER — Other Ambulatory Visit: Payer: Self-pay

## 2023-11-23 ENCOUNTER — Other Ambulatory Visit: Payer: Self-pay | Admitting: Internal Medicine

## 2023-11-25 ENCOUNTER — Ambulatory Visit (INDEPENDENT_AMBULATORY_CARE_PROVIDER_SITE_OTHER): Payer: 59 | Admitting: Internal Medicine

## 2023-11-25 ENCOUNTER — Encounter: Payer: Self-pay | Admitting: Internal Medicine

## 2023-11-25 VITALS — BP 148/100 | HR 84 | Ht 69.0 in | Wt 260.0 lb

## 2023-11-25 DIAGNOSIS — E66813 Obesity, class 3: Secondary | ICD-10-CM

## 2023-11-25 DIAGNOSIS — E1159 Type 2 diabetes mellitus with other circulatory complications: Secondary | ICD-10-CM | POA: Diagnosis not present

## 2023-11-25 DIAGNOSIS — Z794 Long term (current) use of insulin: Secondary | ICD-10-CM

## 2023-11-25 DIAGNOSIS — E785 Hyperlipidemia, unspecified: Secondary | ICD-10-CM | POA: Diagnosis not present

## 2023-11-25 DIAGNOSIS — E1165 Type 2 diabetes mellitus with hyperglycemia: Secondary | ICD-10-CM

## 2023-11-25 DIAGNOSIS — E1142 Type 2 diabetes mellitus with diabetic polyneuropathy: Secondary | ICD-10-CM | POA: Diagnosis not present

## 2023-11-25 LAB — POCT GLYCOSYLATED HEMOGLOBIN (HGB A1C): Hemoglobin A1C: 7 % — AB (ref 4.0–5.6)

## 2023-11-25 MED ORDER — LANTUS SOLOSTAR 100 UNIT/ML ~~LOC~~ SOPN: PEN_INJECTOR | SUBCUTANEOUS | Status: DC

## 2023-11-25 NOTE — Patient Instructions (Addendum)
Please change: - Lantus 25 units in am  - Humalog 6-10 units before meals - Humalog Sliding scale:  121 - 150: 1 unit  151 - 200: 2 units  201 - 250: 3 units   251 - 300: 5 units  >300: 7 units   Continue: - Ozempic 1 mg weekly  Please return in 3 months.
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
## 2023-11-28 ENCOUNTER — Ambulatory Visit (HOSPITAL_COMMUNITY): Payer: 59

## 2023-11-30 ENCOUNTER — Ambulatory Visit: Payer: 59

## 2023-12-02 ENCOUNTER — Other Ambulatory Visit (HOSPITAL_COMMUNITY): Payer: Self-pay

## 2023-12-20 ENCOUNTER — Ambulatory Visit (INDEPENDENT_AMBULATORY_CARE_PROVIDER_SITE_OTHER): Payer: 59 | Admitting: Podiatry

## 2023-12-20 ENCOUNTER — Encounter: Payer: Self-pay | Admitting: Podiatry

## 2023-12-20 VITALS — Ht 69.0 in | Wt 260.0 lb

## 2023-12-20 DIAGNOSIS — L97521 Non-pressure chronic ulcer of other part of left foot limited to breakdown of skin: Secondary | ICD-10-CM | POA: Diagnosis not present

## 2023-12-20 NOTE — Progress Notes (Signed)
  Subjective:  Patient ID: Joshua [name], male    DOB: 11-07-1956,  MRN: [id number]  Chief Complaint  Patient presents with   Wound Check    He is here for a wound check and reports the spot is doing well.     68 y.o. male presents with the above complaint. History confirmed with patient.  He returns for follow-up, wound seems to be healed he has not had any drainage  Objective:  Physical Exam: warm, good capillary refill, weakly palpable DP and PT pulses, and diffuse peripheral neuropathy, ulceration has fully healed no active ulceration or drainage.    Assessment:   1. Ulcer of left foot, limited to breakdown of skin [facility])        Plan:  Patient was evaluated and treated and all questions answered.    Ulcer left foot Wounds remain healed.  I debrided the overlying callus with a sharp scalpel to a tolerable level.  I recommend he use the urea  40% cream.  All of his inserts issues have been modified for an offload at the wound site.  Return to see me.  Inspect feet daily.    Return if symptoms worsen or fail to improve.

## 2023-12-20 NOTE — Patient Instructions (Signed)
 Look for urea 40% cream or ointment and apply to the thickened dry skin / calluses. This can be bought over the counter, at a pharmacy or online such as [employer].

## 2024-01-01 ENCOUNTER — Other Ambulatory Visit: Payer: Self-pay | Admitting: Internal Medicine

## 2024-01-02 ENCOUNTER — Other Ambulatory Visit (HOSPITAL_COMMUNITY): Payer: Self-pay

## 2024-01-02 ENCOUNTER — Other Ambulatory Visit: Payer: Self-pay

## 2024-01-19 DIAGNOSIS — N2581 Secondary hyperparathyroidism of renal origin: Secondary | ICD-10-CM | POA: Diagnosis not present

## 2024-01-19 DIAGNOSIS — E559 Vitamin D deficiency, unspecified: Secondary | ICD-10-CM | POA: Diagnosis not present

## 2024-01-19 DIAGNOSIS — N184 Chronic kidney disease, stage 4 (severe): Secondary | ICD-10-CM | POA: Diagnosis not present

## 2024-01-19 DIAGNOSIS — I129 Hypertensive chronic kidney disease with stage 1 through stage 4 chronic kidney disease, or unspecified chronic kidney disease: Secondary | ICD-10-CM | POA: Diagnosis not present

## 2024-01-19 DIAGNOSIS — I10 Essential (primary) hypertension: Secondary | ICD-10-CM | POA: Diagnosis not present

## 2024-01-19 DIAGNOSIS — I5032 Chronic diastolic (congestive) heart failure: Secondary | ICD-10-CM | POA: Diagnosis not present

## 2024-01-24 DIAGNOSIS — H40053 Ocular hypertension, bilateral: Secondary | ICD-10-CM | POA: Diagnosis not present

## 2024-02-17 MED ORDER — INSULIN LISPRO (1 UNIT DIAL) 100 UNIT/ML (KWIKPEN): 5.0000 [IU] | PEN_INJECTOR | Freq: Three times a day (TID) | SUBCUTANEOUS | 1 refills | Status: DC

## 2024-02-20 ENCOUNTER — Encounter: Payer: Self-pay | Admitting: Cardiovascular Disease

## 2024-02-20 ENCOUNTER — Ambulatory Visit: Payer: 59 | Attending: Cardiovascular Disease | Admitting: Cardiovascular Disease

## 2024-02-20 DIAGNOSIS — R0602 Shortness of breath: Secondary | ICD-10-CM

## 2024-02-20 DIAGNOSIS — I5042 Chronic combined systolic (congestive) and diastolic (congestive) heart failure: Secondary | ICD-10-CM

## 2024-02-20 DIAGNOSIS — E785 Hyperlipidemia, unspecified: Secondary | ICD-10-CM

## 2024-02-20 DIAGNOSIS — I10 Essential (primary) hypertension: Secondary | ICD-10-CM

## 2024-02-20 DIAGNOSIS — Z9861 Coronary angioplasty status: Secondary | ICD-10-CM | POA: Diagnosis not present

## 2024-02-20 DIAGNOSIS — N1832 Chronic kidney disease, stage 3b: Secondary | ICD-10-CM | POA: Diagnosis not present

## 2024-02-20 DIAGNOSIS — E66812 Obesity, class 2: Secondary | ICD-10-CM

## 2024-02-20 DIAGNOSIS — G4733 Obstructive sleep apnea (adult) (pediatric): Secondary | ICD-10-CM | POA: Diagnosis not present

## 2024-02-20 DIAGNOSIS — E118 Type 2 diabetes mellitus with unspecified complications: Secondary | ICD-10-CM | POA: Diagnosis not present

## 2024-02-20 DIAGNOSIS — I2510 Atherosclerotic heart disease of native coronary artery without angina pectoris: Secondary | ICD-10-CM

## 2024-02-20 DIAGNOSIS — Z794 Long term (current) use of insulin: Secondary | ICD-10-CM | POA: Diagnosis not present

## 2024-02-20 MED ORDER — ROSUVASTATIN CALCIUM 40 MG PO TABS: 40.0000 mg | ORAL_TABLET | Freq: Every day | ORAL | 3 refills | Status: DC

## 2024-02-20 NOTE — Patient Instructions (Addendum)
 Medication Instructions:  Start Rosuvastatin 40mg . Take this tablet 1 time daily *If you need a refill on your cardiac medications before your next appointment, please call your pharmacy*   Lab Work: Return for fasting labs in 3 months If you have labs (blood work) drawn today and your tests are completely normal, you will receive your results only by: MyChart Message (if you have MyChart) OR A paper copy in the mail If you have any lab test that is abnormal or we need to change your treatment, we will call you to review the results.   Testing/Procedures: Your physician has requested that you have a Cardiac Pet Stress Test.   This testing is completed at [facility] ([address] [place], [place] [address]) or [facility] ([address], [place], [place]). Please arrive 30 minutes prior to your scheduled time.  The schedulers will call you to get this scheduled. Please follow further testing instructions below.   Follow-Up: At [facility], you and your health needs are our priority.  As part of our continuing mission to provide you with exceptional heart care, we have created designated Provider Care Teams.  These Care Teams include your primary Cardiologist (physician) and Advanced Practice Providers (APPs -  Physician Assistants and Nurse Practitioners) who all work together to provide you with the care you need, when you need it.  We recommend signing up for the patient portal called "MyChart".  Sign up information is provided on this After Visit Summary.  MyChart is used to connect with patients for Virtual Visits (Telemedicine).  Patients are able to view lab/test results, encounter notes, upcoming appointments, etc.  Non-urgent messages can be sent to your provider as well.   To learn more about what you can do with MyChart, go to [email or web address].    Your next appointment:   3 month(s)  Provider:   Edd Fabian, FNP        Other Instructions        1st Floor: - Lobby - Registration  - Pharmacy  - Lab - Cafe   2nd Floor: - PV Lab - Diagnostic Testing (echo, CT, nuclear med)   3rd Floor: - Vacant   4th Floor: - TCTS (cardiothoracic surgery) - AFib Clinic - Structural Heart Clinic - Vascular Surgery  - Vascular Ultrasound   5th Floor: - [facility] (general and EP) - Clinical Pharmacy for coumadin, hypertension, lipid, weight-loss medications, and med management appointments      Valet parking services will be available as well.       If you have any questions or concerns regarding your c-pap, bi-pap or sleep accessories, please contact Brandie Rorie at [phone].
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
## 2024-02-21 ENCOUNTER — Other Ambulatory Visit (HOSPITAL_COMMUNITY): Payer: Self-pay

## 2024-02-23 ENCOUNTER — Telehealth: Payer: Self-pay

## 2024-02-23 MED ORDER — INSULIN LISPRO (1 UNIT DIAL) 100 UNIT/ML (KWIKPEN): 5.0000 [IU] | PEN_INJECTOR | Freq: Three times a day (TID) | SUBCUTANEOUS | 1 refills | Status: DC

## 2024-02-23 NOTE — Addendum Note (Signed)
 Addended by: Pollie Meyer on: 02/23/2024 10:35 AM   Modules accepted: Orders

## 2024-02-23 NOTE — Telephone Encounter (Signed)
 Requested Prescriptions   Signed Prescriptions Disp Refills   insulin lispro (HUMALOG KWIKPEN) 100 UNIT/ML KwikPen 30 mL 1    Sig: Inject 5-14 Units into the skin 3 (three) times daily with meals. Inject 5 units with meals, add the following per glucose level. CBG 70 - 120: 0 units CBG 121 - 150: 1 unit CBG 151 - 200: 2 units CBG 201 - 250: 3 units CBG 251 - 300: 5 units CBG 301 - 350: 7 units CBG 351 - 400: 9 units MDD: 50 units    Authorizing Provider: Carlus Pavlov    Ordering User: Pollie Meyer

## 2024-02-23 NOTE — Addendum Note (Signed)
 Addended by: Pollie Meyer on: 02/23/2024 10:49 AM   Modules accepted: Orders

## 2024-02-26 ENCOUNTER — Encounter: Payer: Self-pay | Admitting: Cardiovascular Disease

## 2024-03-14 ENCOUNTER — Ambulatory Visit (INDEPENDENT_AMBULATORY_CARE_PROVIDER_SITE_OTHER): Payer: 59 | Admitting: Internal Medicine

## 2024-03-14 ENCOUNTER — Encounter: Payer: Self-pay | Admitting: Internal Medicine

## 2024-03-14 ENCOUNTER — Other Ambulatory Visit: Payer: Self-pay

## 2024-03-14 VITALS — BP 130/80 | HR 89 | Ht 69.0 in | Wt 255.8 lb

## 2024-03-14 DIAGNOSIS — E1165 Type 2 diabetes mellitus with hyperglycemia: Secondary | ICD-10-CM | POA: Diagnosis not present

## 2024-03-14 DIAGNOSIS — Z7985 Long-term (current) use of injectable non-insulin antidiabetic drugs: Secondary | ICD-10-CM

## 2024-03-14 DIAGNOSIS — E785 Hyperlipidemia, unspecified: Secondary | ICD-10-CM

## 2024-03-14 DIAGNOSIS — E1159 Type 2 diabetes mellitus with other circulatory complications: Secondary | ICD-10-CM

## 2024-03-14 DIAGNOSIS — Z794 Long term (current) use of insulin: Secondary | ICD-10-CM

## 2024-03-14 DIAGNOSIS — E66813 Obesity, class 3: Secondary | ICD-10-CM

## 2024-03-14 LAB — POCT GLYCOSYLATED HEMOGLOBIN (HGB A1C): Hemoglobin A1C: 7 % — AB (ref 4.0–5.6)

## 2024-03-14 NOTE — Patient Instructions (Addendum)
 Please continue: - Lantus 25 units in am  - Humalog 6-10 units 15 min before meals but use 4-5 units 15 min before a snack at night - Humalog Sliding scale - use this at night!:  121 - 150: 1 unit  151 - 200: 2 units  201 - 250: 3 units   251 - 300: 5 units  >300: 7 units  - Ozempic 1 mg weekly   DO NOT use Lantus for correction.  Please return in 3-4 months.
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
## 2024-03-28 ENCOUNTER — Telehealth: Admitting: Physician Assistant

## 2024-03-28 DIAGNOSIS — M545 Low back pain, unspecified: Secondary | ICD-10-CM | POA: Diagnosis not present

## 2024-03-28 NOTE — Progress Notes (Signed)
 Have sent patient a question regarding updating local pharmacy for medications to be sent. Will No Charge for now. Can update billing if patient responds before end of shift today.

## 2024-03-29 MED ORDER — CYCLOBENZAPRINE HCL 10 MG PO TABS: 10.0000 mg | ORAL_TABLET | Freq: Three times a day (TID) | ORAL | 0 refills | Status: DC | PRN

## 2024-03-29 NOTE — Progress Notes (Signed)
 E-Visit for Back Pain   We are sorry that you are not feeling well.  Here is how we plan to help!  Based on what you have shared with me it looks like you mostly have acute back pain.  Acute back pain is defined as musculoskeletal pain that can resolve in 1-3 weeks with conservative treatment.  I have prescribed  Flexeril 10 mg every eight hours as needed which is a muscle relaxer  You can use OTC Tylenol as well.  Please keep in mind that muscle relaxer's can cause fatigue and should not be taken while at work or driving.  Back pain is very common.  The pain often gets better over time.  The cause of back pain is usually not dangerous.  Most people can learn to manage their back pain on their own.  Home Care Stay active.  Start with short walks on flat ground if you can.  Try to walk farther each day. Do not sit, drive or stand in one place for more than 30 minutes.  Do not stay in bed. Do not avoid exercise or work.  Activity can help your back heal faster. Be careful when you bend or lift an object.  Bend at your knees, keep the object close to you, and do not twist. Sleep on a firm mattress.  Lie on your side, and bend your knees.  If you lie on your back, put a pillow under your knees. Only take medicines as told by your doctor. Put ice on the injured area. Put ice in a plastic bag Place a towel between your skin and the bag Leave the ice on for 15-20 minutes, 3-4 times a day for the first 2-3 days. 210 After that, you can switch between ice and heat packs. Ask your doctor about back exercises or massage. Avoid feeling anxious or stressed.  Find good ways to deal with stress, such as exercise.  Get Help Right Way If: Your pain does not go away with rest or medicine. Your pain does not go away in 1 week. You have new problems. You do not feel well. The pain spreads into your legs. You cannot control when you poop (bowel movement) or pee (urinate) You feel sick to your stomach  (nauseous) or throw up (vomit) You have belly (abdominal) pain. You feel like you may pass out (faint). If you develop a fever.  Make Sure you: Understand these instructions. Will watch your condition Will get help right away if you are not doing well or get worse.  Your e-visit answers were reviewed by a board certified advanced clinical practitioner to complete your personal care plan.  Depending on the condition, your plan could have included both over the counter or prescription medications.  If there is a problem please reply  once you have received a response from your provider.  Your safety is important to [place].  If you have drug allergies check your prescription carefully.    You can use MyChart to ask questions about today's visit, request a non-urgent call back, or ask for a work or school excuse for 24 hours related to this e-Visit. If it has been greater than 24 hours you will need to follow up with your provider, or enter a new e-Visit to address those concerns.  You will get an e-mail in the next two days asking about your experience.  I hope that your e-visit has been valuable and will speed your recovery. Thank you for using  e-visits.

## 2024-03-29 NOTE — Addendum Note (Signed)
 Addended by: Farris Hong on: 03/29/2024 07:13 AM   Modules accepted: Orders, Level of Service

## 2024-03-31 ENCOUNTER — Other Ambulatory Visit: Payer: Self-pay | Admitting: Internal Medicine

## 2024-03-31 ENCOUNTER — Other Ambulatory Visit (HOSPITAL_BASED_OUTPATIENT_CLINIC_OR_DEPARTMENT_OTHER): Payer: Self-pay

## 2024-03-31 ENCOUNTER — Other Ambulatory Visit (HOSPITAL_COMMUNITY): Payer: Self-pay

## 2024-03-31 DIAGNOSIS — E1142 Type 2 diabetes mellitus with diabetic polyneuropathy: Secondary | ICD-10-CM

## 2024-04-02 ENCOUNTER — Other Ambulatory Visit: Payer: Self-pay | Admitting: Internal Medicine

## 2024-04-02 ENCOUNTER — Other Ambulatory Visit: Payer: Self-pay

## 2024-04-03 ENCOUNTER — Other Ambulatory Visit: Payer: Self-pay | Admitting: Internal Medicine

## 2024-04-03 MED ORDER — LANTUS SOLOSTAR 100 UNIT/ML ~~LOC~~ SOPN: PEN_INJECTOR | SUBCUTANEOUS | Status: DC

## 2024-04-03 NOTE — Telephone Encounter (Signed)
 NO LONGER [facility] PATIENT

## 2024-04-04 NOTE — Telephone Encounter (Signed)
 NO LONGER [facility] PATIENT

## 2024-04-05 NOTE — Progress Notes (Signed)
 I have spent 5 minutes in review of e-visit questionnaire, review and updating patient chart, medical decision making and response to patient.   Piedad Climes, PA-C

## 2024-04-09 ENCOUNTER — Other Ambulatory Visit: Payer: Self-pay | Admitting: Internal Medicine

## 2024-04-09 DIAGNOSIS — E1142 Type 2 diabetes mellitus with diabetic polyneuropathy: Secondary | ICD-10-CM

## 2024-04-10 ENCOUNTER — Other Ambulatory Visit: Payer: Self-pay

## 2024-04-10 ENCOUNTER — Other Ambulatory Visit (HOSPITAL_COMMUNITY): Payer: Self-pay

## 2024-04-10 MED ORDER — LANTUS SOLOSTAR 100 UNIT/ML ~~LOC~~ SOPN
25.0000 [IU] | PEN_INJECTOR | Freq: Every morning | SUBCUTANEOUS | 2 refills | Status: DC
Start: 2024-04-10 — End: 2024-06-06
  Filled 2024-04-10: qty 15, 60d supply, fill #0

## 2024-04-12 ENCOUNTER — Ambulatory Visit (INDEPENDENT_AMBULATORY_CARE_PROVIDER_SITE_OTHER): Admitting: Podiatry

## 2024-04-12 DIAGNOSIS — L97521 Non-pressure chronic ulcer of other part of left foot limited to breakdown of skin: Secondary | ICD-10-CM

## 2024-04-13 ENCOUNTER — Other Ambulatory Visit (HOSPITAL_COMMUNITY): Payer: Self-pay

## 2024-04-15 MED ORDER — GENTAMICIN SULFATE 0.1 % EX OINT: 1.0000 | TOPICAL_OINTMENT | Freq: Every day | CUTANEOUS | 0 refills | Status: DC

## 2024-04-15 NOTE — Progress Notes (Signed)
  Subjective:  Patient ID: Joshua [name], male    DOB: [birth year],  MRN: [id number]  Chief Complaint  Patient presents with   Foot Pain    Callous under left foot. IDDM A1C 7.0. 6 pain walking    68 y.o. male presents with the above complaint. History confirmed with patient.  He returns for follow-up, he noted a callus and blood blister forming  Objective:  Physical Exam: warm, good capillary refill, weakly palpable DP and PT pulses, and diffuse peripheral neuropathy, previous ulceration site had a large callus with a blood blister with partial-thickness skin breakdown no disruption of dermis    Assessment:   1. Ulcer of left foot, limited to breakdown of skin [facility])        Plan:  Patient was evaluated and treated and all questions answered.    Ulcer left foot Callus and blood blister was evacuated and debrided today.  Sterile dressing applied.  Recommended applying gentamicin ointment Rx was sent to pharmacy for this.  Return in 4 weeks to reevaluate.    Return in about 4 weeks (around 05/10/2024) for wound care.

## 2024-04-16 ENCOUNTER — Other Ambulatory Visit (INDEPENDENT_AMBULATORY_CARE_PROVIDER_SITE_OTHER): Payer: Self-pay | Admitting: Internal Medicine

## 2024-04-16 ENCOUNTER — Other Ambulatory Visit: Payer: Self-pay | Admitting: Podiatry

## 2024-04-19 DIAGNOSIS — M79672 Pain in left foot: Secondary | ICD-10-CM | POA: Diagnosis not present

## 2024-04-19 DIAGNOSIS — R296 Repeated falls: Secondary | ICD-10-CM | POA: Diagnosis not present

## 2024-04-19 DIAGNOSIS — M545 Low back pain, unspecified: Secondary | ICD-10-CM | POA: Diagnosis not present

## 2024-04-19 DIAGNOSIS — M6281 Muscle weakness (generalized): Secondary | ICD-10-CM | POA: Diagnosis not present

## 2024-04-23 ENCOUNTER — Other Ambulatory Visit (HOSPITAL_COMMUNITY): Payer: Self-pay

## 2024-04-27 DIAGNOSIS — M545 Low back pain, unspecified: Secondary | ICD-10-CM | POA: Diagnosis not present

## 2024-04-27 DIAGNOSIS — M79672 Pain in left foot: Secondary | ICD-10-CM | POA: Diagnosis not present

## 2024-04-27 DIAGNOSIS — M6281 Muscle weakness (generalized): Secondary | ICD-10-CM | POA: Diagnosis not present

## 2024-04-27 DIAGNOSIS — R296 Repeated falls: Secondary | ICD-10-CM | POA: Diagnosis not present

## 2024-04-30 DIAGNOSIS — M79672 Pain in left foot: Secondary | ICD-10-CM | POA: Diagnosis not present

## 2024-04-30 DIAGNOSIS — M6281 Muscle weakness (generalized): Secondary | ICD-10-CM | POA: Diagnosis not present

## 2024-04-30 DIAGNOSIS — M545 Low back pain, unspecified: Secondary | ICD-10-CM | POA: Diagnosis not present

## 2024-04-30 DIAGNOSIS — R296 Repeated falls: Secondary | ICD-10-CM | POA: Diagnosis not present

## 2024-05-02 DIAGNOSIS — M545 Low back pain, unspecified: Secondary | ICD-10-CM | POA: Diagnosis not present

## 2024-05-02 DIAGNOSIS — R296 Repeated falls: Secondary | ICD-10-CM | POA: Diagnosis not present

## 2024-05-02 DIAGNOSIS — M79672 Pain in left foot: Secondary | ICD-10-CM | POA: Diagnosis not present

## 2024-05-02 DIAGNOSIS — M6281 Muscle weakness (generalized): Secondary | ICD-10-CM | POA: Diagnosis not present

## 2024-05-04 ENCOUNTER — Encounter (HOSPITAL_COMMUNITY): Payer: Self-pay

## 2024-05-08 ENCOUNTER — Ambulatory Visit (HOSPITAL_COMMUNITY)
Admission: RE | Admit: 2024-05-08 | Discharge: 2024-05-08 | Disposition: A | Discharge status: Home / Self Care | Source: Ambulatory Visit | Attending: Cardiovascular Disease | Admitting: Cardiovascular Disease

## 2024-05-08 DIAGNOSIS — R0602 Shortness of breath: Secondary | ICD-10-CM | POA: Diagnosis not present

## 2024-05-08 LAB — NM PET CT CARDIAC PERFUSION MULTI W/ABSOLUTE BLOODFLOW
LV dias vol: 116 mL (ref 62–150)
LV sys vol: 75 mL
MBFR: 1.68
Nuc Rest EF: 35 %
Nuc Stress EF: 37 %
Rest MBF: 0.6 ml/g/min
Rest Nuclear Isotope Dose: 30.2 mCi
ST Depression (mm): 0 mm
Stress MBF: 1.01 ml/g/min
Stress Nuclear Isotope Dose: 30.1 mCi

## 2024-05-08 MED ORDER — RUBIDIUM RB82 GENERATOR (RUBYFILL)
30.1100 | PACK | Freq: Once | INTRAVENOUS | Status: AC
  Administered 2024-05-08: 30.11 via INTRAVENOUS

## 2024-05-08 MED ORDER — RUBIDIUM RB82 GENERATOR (RUBYFILL)
30.2400 | PACK | Freq: Once | INTRAVENOUS | Status: AC
  Administered 2024-05-08: 30.24 via INTRAVENOUS

## 2024-05-08 MED ORDER — REGADENOSON 0.4 MG/5ML IV SOLN
0.4000 mg | Freq: Once | INTRAVENOUS | Status: AC
Start: 2024-05-08 — End: 2024-05-08
  Administered 2024-05-08: 0.4 mg via INTRAVENOUS

## 2024-05-08 MED ORDER — REGADENOSON 0.4 MG/5ML IV SOLN
INTRAVENOUS | Status: AC
  Filled 2024-05-08: qty 5

## 2024-05-11 ENCOUNTER — Ambulatory Visit (INDEPENDENT_AMBULATORY_CARE_PROVIDER_SITE_OTHER): Admitting: Podiatry

## 2024-05-11 ENCOUNTER — Ambulatory Visit (INDEPENDENT_AMBULATORY_CARE_PROVIDER_SITE_OTHER)

## 2024-05-11 ENCOUNTER — Other Ambulatory Visit (HOSPITAL_COMMUNITY): Payer: Self-pay

## 2024-05-11 DIAGNOSIS — M79672 Pain in left foot: Secondary | ICD-10-CM | POA: Diagnosis not present

## 2024-05-11 DIAGNOSIS — I739 Peripheral vascular disease, unspecified: Secondary | ICD-10-CM | POA: Diagnosis not present

## 2024-05-11 DIAGNOSIS — R296 Repeated falls: Secondary | ICD-10-CM | POA: Diagnosis not present

## 2024-05-11 DIAGNOSIS — L97522 Non-pressure chronic ulcer of other part of left foot with fat layer exposed: Secondary | ICD-10-CM | POA: Diagnosis not present

## 2024-05-11 DIAGNOSIS — M86272 Subacute osteomyelitis, left ankle and foot: Secondary | ICD-10-CM | POA: Diagnosis not present

## 2024-05-11 DIAGNOSIS — M6281 Muscle weakness (generalized): Secondary | ICD-10-CM | POA: Diagnosis not present

## 2024-05-11 DIAGNOSIS — M545 Low back pain, unspecified: Secondary | ICD-10-CM | POA: Diagnosis not present

## 2024-05-11 MED ORDER — DOXYCYCLINE HYCLATE 100 MG PO TABS
100.0000 mg | ORAL_TABLET | Freq: Two times a day (BID) | ORAL | 0 refills | Status: DC
  Filled 2024-05-11: qty 20, 10d supply, fill #0

## 2024-05-12 NOTE — Progress Notes (Signed)
 Subjective: Chief Complaint  Patient presents with   Wound Check    RM 12 Patient is here for wound check of left foot. Pt is treating wound at home with silvadene .   68 year old male presents the office today for follow-up evaluation of the wound on the left.  He has been under the care of Dr. Michalene Agee as well as Dr. Sikora for this.  He is currently using Silvadene  to the wound.  He has been having swelling to the foot he states for the last couple months.  He has not seen any purulence.  He does not report any fevers or chills.  He is at this started after a piece of glass was in the foot and was removed.   Objective: AAO x3, NAD DP/PT pulses decreased bilaterally, CRT less than 3 seconds As pictured below there is a thick hyperkeratotic lesion along the fifth metatarsal base and wound going plantarly with macerated tissue.  Lateral aspect is more preulcerative on the plantar aspect of the debrided this the wound measures 1.2 x 1.2 x 0.4 cm without any probing to bone, undermining or tunneling.  Was not able to measure the wound prior to debridement as it was covered mostly with callus, hyperkeratotic tissue.  There is hyperkeratotic periwound.  There is no fluctuation or crepitation.  There is edema present of the foot. No pain with calf compression, swelling, warmth, erythema       Assessment: Ulceration left foot  Plan: Radiology -X-rays obtained reviewed.  This likely chronic changes present at the fourth, fifth metatarsal cuboid joint.  Arthritic changes are present.  Ulceration left foot -Medically necessary wound debridement was performed today.  Sharply debrided the hyperkeratotic tissue, ulceration utilizing a #[id number] with scalpel down to healthy, granular tissue.  There was no significant blood loss.  Hemostasis achieved through manual compression.  This is through the subcutaneous tissue with fat layer exposed.  Clean the area with saline.  Continue with Silvadene  dressing  changes for now. - You have cam boot which was dispensed to help facilitate soft tissue healing.  Swelling, concern for osteomyelitis -Doxycycline  - I have ordered a stat MRI of the left foot to further evaluate for osteomyelitis. -Monitor for any clinical signs or symptoms of infection and directed to call the office immediately should any occur or go to the ER.  PAD - ABI performed October 26, 2023 with monophasic waveforms.  Referral to vascular given ongoing nature of the wound.  Return in about 10 days (around 05/21/2024) for ulcer left foot.  Charity Conch DPM

## 2024-05-14 DIAGNOSIS — R296 Repeated falls: Secondary | ICD-10-CM | POA: Diagnosis not present

## 2024-05-14 DIAGNOSIS — M545 Low back pain, unspecified: Secondary | ICD-10-CM | POA: Diagnosis not present

## 2024-05-14 DIAGNOSIS — M79672 Pain in left foot: Secondary | ICD-10-CM | POA: Diagnosis not present

## 2024-05-14 DIAGNOSIS — M6281 Muscle weakness (generalized): Secondary | ICD-10-CM | POA: Diagnosis not present

## 2024-05-16 ENCOUNTER — Ambulatory Visit
Admission: RE | Admit: 2024-05-16 | Discharge: 2024-05-16 | Discharge status: Home / Self Care | Source: Ambulatory Visit | Attending: Podiatry

## 2024-05-16 ENCOUNTER — Telehealth: Payer: Self-pay

## 2024-05-16 DIAGNOSIS — M79672 Pain in left foot: Secondary | ICD-10-CM | POA: Diagnosis not present

## 2024-05-16 DIAGNOSIS — L97522 Non-pressure chronic ulcer of other part of left foot with fat layer exposed: Secondary | ICD-10-CM

## 2024-05-16 DIAGNOSIS — R296 Repeated falls: Secondary | ICD-10-CM | POA: Diagnosis not present

## 2024-05-16 DIAGNOSIS — M6281 Muscle weakness (generalized): Secondary | ICD-10-CM | POA: Diagnosis not present

## 2024-05-16 DIAGNOSIS — M545 Low back pain, unspecified: Secondary | ICD-10-CM | POA: Diagnosis not present

## 2024-05-16 DIAGNOSIS — L03116 Cellulitis of left lower limb: Secondary | ICD-10-CM | POA: Diagnosis not present

## 2024-05-16 NOTE — Telephone Encounter (Signed)
-----   Message from Charity Conch sent at 05/12/2024  8:08 AM EDT ----- I ordered a stat MRI on Friday.  Can we follow-up on this on Monday?  Thank you.

## 2024-05-16 NOTE — Telephone Encounter (Signed)
 Patient is scheduled for MRI today, 05/16/24 at 4:00 pm

## 2024-05-18 ENCOUNTER — Ambulatory Visit: Payer: Self-pay | Admitting: Podiatry

## 2024-05-18 ENCOUNTER — Other Ambulatory Visit: Payer: Self-pay | Admitting: *Deleted

## 2024-05-18 ENCOUNTER — Ambulatory Visit: Payer: Self-pay | Admitting: Cardiovascular Disease

## 2024-05-18 DIAGNOSIS — I7025 Atherosclerosis of native arteries of other extremities with ulceration: Secondary | ICD-10-CM

## 2024-05-18 DIAGNOSIS — S81802A Unspecified open wound, left lower leg, initial encounter: Secondary | ICD-10-CM

## 2024-05-20 NOTE — Progress Notes (Unsigned)
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 valve  regurgitation. No evidence of mitral stenosis.   4. The aortic valve is normal in structure. Aortic valve regurgitation is  not visualized. No aortic stenosis is present.   5. The inferior vena cava is dilated in size with >50% respiratory  variability, suggesting right atrial pressure of 8 mmHg.   FINDINGS   Left Ventricle: Left ventricular ejection fraction, by estimation, is 55  to 60%. The left ventricle has normal function. The left ventricle has no  regional wall motion abnormalities. The left ventricular internal cavity  size was normal in size. There is   no left ventricular hypertrophy. Left ventricular diastolic parameters  were normal.   Right Ventricle: The right ventricular size is normal. No increase in  right ventricular wall thickness. Right ventricular systolic function is  normal.   Left Atrium: Left atrial size was normal in size.   Right Atrium: Right atrial size was normal in size.   Pericardium: There is no evidence of pericardial effusion.   Mitral Valve: The mitral valve is normal in structure. Mild mitral annular  calcification. No evidence of mitral valve regurgitation. No evidence of  mitral valve stenosis. MV peak gradient, 9.6  mmHg. The mean mitral valve  gradient is 3.0 mmHg.   Tricuspid Valve: The tricuspid valve is normal in structure. Tricuspid  valve regurgitation is not demonstrated. No evidence of tricuspid  stenosis.   Aortic Valve: The aortic valve is normal in structure. Aortic valve  regurgitation is not visualized. No aortic stenosis is present.   Pulmonic Valve: The pulmonic valve was normal in structure. Pulmonic valve  regurgitation is not visualized. No evidence of pulmonic stenosis.   Aorta: The aortic root is normal in size and structure.   Venous: The inferior vena cava is dilated in size with greater than 50%  respiratory variability, suggesting right atrial pressure of 8 mmHg.   IAS/Shunts: No atrial level shunt detected by color flow Doppler.     EKG: Sinus rhythm with first-degree AV block possible left atrial enlargement minimal voltage criteria for LVH 85 bpm  EKG 3/23 Sinus rhythm with first-degree AV block possible left atrial enlargement 91 bpm no ST or T wave deviation  EKG 09/11/2021 Normal sinus rhythm no ST or T wave deviation 82 bpm  Recent Labs: 07/10/2023: ALT 10 07/11/2023: Magnesium  2.6 07/13/2023: BUN 30; Creatinine, Ser 1.91; Hemoglobin 13.8; Platelets 167; Potassium 4.0; Sodium 131  Recent Lipid Panel    Component Value Date/Time   CHOL 106 11/30/2022 0209   CHOL 283 (H) 08/11/2021 0929   TRIG 151 (H) 11/30/2022 0209   HDL 43 11/30/2022 0209   HDL 89 08/11/2021 0929   CHOLHDL 2.5 11/30/2022 0209   VLDL 30 11/30/2022 0209   LDLCALC 33 11/30/2022 0209   LDLCALC 169 (H) 08/11/2021 0929    ASSESSMENT & PLAN    1.Chronic combined systolic and diastolic CHF-weight today 264 pounds.  Stable chronic dyspnea.  Echo 11/19/2022 showed an EF of 55-60%, normal diastolic parameters.  Echocardiogram 08/19/2021 showed normal LVEF and G2  DD. Continue  Imdur , furosemide ,  Continue heart healthy low-sodium diet Increase physical activity as tolerated Lower extremity  support stockings Continue Daily weights Maintain weight log Maintain fluid restriction   OSA-has not been using.  CPAP in storage.  Instructed to obtain CPAP from storage clean and begin use. Continue CPAP use Continue weight loss  Syncope-denies recent episodes.  Change positions slowly.  Cardiac event monitor 12/09/2022 did not result.  Carotid ultrasound 01/18/2023 showed 1-39% ICA bilaterally Maintain hydration Lower extremity support stockings   Coronary artery disease-denies episodes of chest pain in the last 2 weeks.  With cardiac catheterization 4/20 showed widely patent LAD stent, 50% mid circumflex lesion, 40% proximal-mid RCA stenosis, ostial LAD-proximal LAD 90% stenosis.  Underwent scoring balloon angioplasty.  Proximal LAD 90% stenosed. Continue  metoprolol , aspirin , Brilinta , Imdur , Imdur  Heart healthy low-sodium diet Maintain physical activity as tolerated   Essential hypertension-BP today 136/88 blood pressure at home 130s over 70s .  Has continued to work with weight loss clinic.   Continue  Imdur ,  Heart healthy low-sodium diet Increase physical activity as tolerated Continue blood pressure log  Hyperlipidemia- 11/30/2022: Cholesterol 106; HDL 43; LDL Cholesterol 33; Triglycerides 151; VLDL 30.  Continue aspirin , rosuvastatin  Heart healthy low-sodium high-fiber diet Increase physical activity as tolerated Repeat fasting lipids and LFTs 12/24      Disposition: Follow-up with Dr. Loetta Ringer  in 6-65months      Medication Adjustments/Labs and Tests Ordered: Current medicines are reviewed at length with the patient today.  Concerns regarding medicines are outlined above.  No orders of the defined types were placed in this encounter.   No orders of the defined types were placed in this encounter.    There are no Patient Instructions on file for this visit.   Signed, Carie Charity, NP  05/20/2024 7:09 PM       Notice: This dictation was prepared with  Dragon dictation along with smaller phrase technology. Any transcriptional errors that result from this process are unintentional and may not be corrected upon review.  I spent 15 ***minutes examining this patient, reviewing medications, and using patient centered shared decision making involving her cardiac care. I spent  20 minutes reviewing  past medical history,  medications, and prior cardiac tests.

## 2024-05-21 DIAGNOSIS — N184 Chronic kidney disease, stage 4 (severe): Secondary | ICD-10-CM | POA: Diagnosis not present

## 2024-05-21 DIAGNOSIS — I129 Hypertensive chronic kidney disease with stage 1 through stage 4 chronic kidney disease, or unspecified chronic kidney disease: Secondary | ICD-10-CM | POA: Diagnosis not present

## 2024-05-21 DIAGNOSIS — E559 Vitamin D deficiency, unspecified: Secondary | ICD-10-CM | POA: Diagnosis not present

## 2024-05-21 DIAGNOSIS — I5032 Chronic diastolic (congestive) heart failure: Secondary | ICD-10-CM | POA: Diagnosis not present

## 2024-05-21 DIAGNOSIS — N2581 Secondary hyperparathyroidism of renal origin: Secondary | ICD-10-CM | POA: Diagnosis not present

## 2024-05-22 DIAGNOSIS — N2581 Secondary hyperparathyroidism of renal origin: Secondary | ICD-10-CM | POA: Diagnosis not present

## 2024-05-22 DIAGNOSIS — N1832 Chronic kidney disease, stage 3b: Secondary | ICD-10-CM | POA: Diagnosis not present

## 2024-05-22 DIAGNOSIS — K2100 Gastro-esophageal reflux disease with esophagitis, without bleeding: Secondary | ICD-10-CM | POA: Diagnosis not present

## 2024-05-22 DIAGNOSIS — I10 Essential (primary) hypertension: Secondary | ICD-10-CM | POA: Diagnosis not present

## 2024-05-22 DIAGNOSIS — M549 Dorsalgia, unspecified: Secondary | ICD-10-CM | POA: Diagnosis not present

## 2024-05-22 DIAGNOSIS — I5022 Chronic systolic (congestive) heart failure: Secondary | ICD-10-CM | POA: Diagnosis not present

## 2024-05-22 DIAGNOSIS — E559 Vitamin D deficiency, unspecified: Secondary | ICD-10-CM | POA: Diagnosis not present

## 2024-05-22 DIAGNOSIS — M109 Gout, unspecified: Secondary | ICD-10-CM | POA: Diagnosis not present

## 2024-05-22 DIAGNOSIS — E7800 Pure hypercholesterolemia, unspecified: Secondary | ICD-10-CM | POA: Diagnosis not present

## 2024-05-22 DIAGNOSIS — E113493 Type 2 diabetes mellitus with severe nonproliferative diabetic retinopathy without macular edema, bilateral: Secondary | ICD-10-CM | POA: Diagnosis not present

## 2024-05-23 ENCOUNTER — Ambulatory Visit: Attending: General Practice | Admitting: General Practice

## 2024-05-23 ENCOUNTER — Other Ambulatory Visit (HOSPITAL_COMMUNITY): Payer: Self-pay

## 2024-05-23 ENCOUNTER — Encounter: Payer: Self-pay | Admitting: General Practice

## 2024-05-23 VITALS — BP 113/75 | HR 82 | Ht 69.0 in | Wt 241.0 lb

## 2024-05-23 DIAGNOSIS — E785 Hyperlipidemia, unspecified: Secondary | ICD-10-CM | POA: Diagnosis not present

## 2024-05-23 DIAGNOSIS — I2510 Atherosclerotic heart disease of native coronary artery without angina pectoris: Secondary | ICD-10-CM | POA: Diagnosis not present

## 2024-05-23 DIAGNOSIS — I5042 Chronic combined systolic (congestive) and diastolic (congestive) heart failure: Secondary | ICD-10-CM | POA: Diagnosis not present

## 2024-05-23 DIAGNOSIS — Z9861 Coronary angioplasty status: Secondary | ICD-10-CM | POA: Diagnosis not present

## 2024-05-23 DIAGNOSIS — R9430 Abnormal result of cardiovascular function study, unspecified: Secondary | ICD-10-CM

## 2024-05-23 DIAGNOSIS — G4733 Obstructive sleep apnea (adult) (pediatric): Secondary | ICD-10-CM | POA: Diagnosis not present

## 2024-05-23 DIAGNOSIS — I10 Essential (primary) hypertension: Secondary | ICD-10-CM | POA: Diagnosis not present

## 2024-05-23 DIAGNOSIS — R55 Syncope and collapse: Secondary | ICD-10-CM

## 2024-05-23 NOTE — Patient Instructions (Signed)
 Medication Instructions:  Your physician recommends that you continue on your current medications as directed. Please refer to the Current Medication list given to you today.  *If you need a refill on your cardiac medications before your next appointment, please call your pharmacy*  Lab Work: TODAY: Lipids, LFTs If you have labs (blood work) drawn today and your tests are completely normal, you will receive your results only by: MyChart Message (if you have MyChart) OR A paper copy in the mail If you have any lab test that is abnormal or we need to change your treatment, we will call you to review the results.   Follow-Up: At [facility], you and your health needs are our priority.  As part of our continuing mission to provide you with exceptional heart care, our providers are all part of one team.  This team includes your primary Cardiologist (physician) and Advanced Practice Providers or APPs (Physician Assistants and Nurse Practitioners) who all work together to provide you with the care you need, when you need it.  Your next appointment:   2 month(s)  Provider:   Lawana Pray, NP           Other Instructions Your physician discussed the importance of regular exercise and recommended that you start or continue a regular exercise program as tolerated for good health.

## 2024-05-24 ENCOUNTER — Other Ambulatory Visit: Payer: Self-pay

## 2024-05-24 ENCOUNTER — Ambulatory Visit: Payer: Self-pay | Admitting: General Practice

## 2024-05-24 DIAGNOSIS — M79672 Pain in left foot: Secondary | ICD-10-CM | POA: Diagnosis not present

## 2024-05-24 DIAGNOSIS — M545 Low back pain, unspecified: Secondary | ICD-10-CM | POA: Diagnosis not present

## 2024-05-24 DIAGNOSIS — R296 Repeated falls: Secondary | ICD-10-CM | POA: Diagnosis not present

## 2024-05-24 DIAGNOSIS — M6281 Muscle weakness (generalized): Secondary | ICD-10-CM | POA: Diagnosis not present

## 2024-05-24 LAB — HEPATIC FUNCTION PANEL
ALT: 18 IU/L (ref 0–44)
AST: 20 IU/L (ref 0–40)
Albumin: 3.8 g/dL — ABNORMAL LOW (ref 3.9–4.9)
Alkaline Phosphatase: 148 IU/L — ABNORMAL HIGH (ref 44–121)
Bilirubin Total: 0.4 mg/dL (ref 0.0–1.2)
Bilirubin, Direct: 0.16 mg/dL (ref 0.00–0.40)
Total Protein: 7.2 g/dL (ref 6.0–8.5)

## 2024-05-24 LAB — LIPID PANEL
Chol/HDL Ratio: 1.9 ratio (ref 0.0–5.0)
Cholesterol, Total: 133 mg/dL (ref 100–199)
HDL: 70 mg/dL (ref >39)
LDL Chol Calc (NIH): 49 mg/dL (ref 0–99)
Triglycerides: 70 mg/dL (ref 0–149)
VLDL Cholesterol Cal: 14 mg/dL (ref 5–40)

## 2024-05-24 MED ORDER — GABAPENTIN 100 MG PO CAPS
100.0000 mg | ORAL_CAPSULE | Freq: Every day | ORAL | 1 refills | Status: AC
  Filled 2024-07-31 – 2024-09-12 (×3): qty 100, 100d supply, fill #0

## 2024-05-24 MED ORDER — DULOXETINE HCL 20 MG PO CPEP
20.0000 mg | ORAL_CAPSULE | Freq: Two times a day (BID) | ORAL | 1 refills | Status: AC
  Filled 2024-07-31: qty 200, 100d supply, fill #0

## 2024-05-24 MED ORDER — FUROSEMIDE 40 MG PO TABS
40.0000 mg | ORAL_TABLET | Freq: Two times a day (BID) | ORAL | 0 refills | Status: DC
  Filled 2024-07-31: qty 200, 100d supply, fill #0

## 2024-05-24 MED ORDER — ALLOPURINOL 100 MG PO TABS: 100.0000 mg | ORAL_TABLET | Freq: Every day | ORAL | 1 refills | Status: DC

## 2024-05-24 MED ORDER — LOSARTAN POTASSIUM 25 MG PO TABS: 25.0000 mg | ORAL_TABLET | Freq: Every day | ORAL | 0 refills | Status: DC

## 2024-05-24 MED ORDER — METOPROLOL SUCCINATE ER 100 MG PO TB24
100.0000 mg | ORAL_TABLET | Freq: Every day | ORAL | 1 refills | Status: AC
  Filled 2024-05-24 – 2024-09-12 (×4): qty 90, 90d supply, fill #0

## 2024-05-24 MED ORDER — METOPROLOL SUCCINATE ER 25 MG PO TB24: 25.0000 mg | ORAL_TABLET | Freq: Three times a day (TID) | ORAL | 1 refills | Status: DC

## 2024-05-25 ENCOUNTER — Encounter (HOSPITAL_COMMUNITY): Payer: Self-pay | Admitting: Emergency Medicine

## 2024-05-25 ENCOUNTER — Other Ambulatory Visit: Payer: Self-pay

## 2024-05-25 ENCOUNTER — Inpatient Hospital Stay (HOSPITAL_COMMUNITY)
Admission: EM | Admit: 2024-05-25 | Discharge: 2024-06-06 | DRG: 240 | Disposition: A | Discharge status: Skilled Nursing Facility | Source: Ambulatory Visit | Attending: Student | Admitting: Student

## 2024-05-25 ENCOUNTER — Ambulatory Visit (INDEPENDENT_AMBULATORY_CARE_PROVIDER_SITE_OTHER): Admitting: Podiatry

## 2024-05-25 VITALS — BP 120/78 | HR 85 | Temp 98.8°F | Resp 16 | Ht 69.0 in | Wt 241.0 lb

## 2024-05-25 DIAGNOSIS — Z89432 Acquired absence of left foot: Secondary | ICD-10-CM | POA: Diagnosis not present

## 2024-05-25 DIAGNOSIS — E785 Hyperlipidemia, unspecified: Secondary | ICD-10-CM | POA: Diagnosis present

## 2024-05-25 DIAGNOSIS — R2681 Unsteadiness on feet: Secondary | ICD-10-CM | POA: Diagnosis not present

## 2024-05-25 DIAGNOSIS — E11628 Type 2 diabetes mellitus with other skin complications: Principal | ICD-10-CM | POA: Diagnosis present

## 2024-05-25 DIAGNOSIS — E66812 Obesity, class 2: Secondary | ICD-10-CM | POA: Diagnosis present

## 2024-05-25 DIAGNOSIS — Z5986 Financial insecurity: Secondary | ICD-10-CM

## 2024-05-25 DIAGNOSIS — L97522 Non-pressure chronic ulcer of other part of left foot with fat layer exposed: Secondary | ICD-10-CM | POA: Diagnosis not present

## 2024-05-25 DIAGNOSIS — I130 Hypertensive heart and chronic kidney disease with heart failure and stage 1 through stage 4 chronic kidney disease, or unspecified chronic kidney disease: Secondary | ICD-10-CM | POA: Diagnosis not present

## 2024-05-25 DIAGNOSIS — G8918 Other acute postprocedural pain: Secondary | ICD-10-CM | POA: Diagnosis not present

## 2024-05-25 DIAGNOSIS — N289 Disorder of kidney and ureter, unspecified: Secondary | ICD-10-CM | POA: Diagnosis not present

## 2024-05-25 DIAGNOSIS — E876 Hypokalemia: Secondary | ICD-10-CM | POA: Diagnosis present

## 2024-05-25 DIAGNOSIS — D631 Anemia in chronic kidney disease: Secondary | ICD-10-CM | POA: Diagnosis not present

## 2024-05-25 DIAGNOSIS — I70292 Other atherosclerosis of native arteries of extremities, left leg: Secondary | ICD-10-CM | POA: Diagnosis not present

## 2024-05-25 DIAGNOSIS — E1169 Type 2 diabetes mellitus with other specified complication: Secondary | ICD-10-CM | POA: Diagnosis not present

## 2024-05-25 DIAGNOSIS — Z741 Need for assistance with personal care: Secondary | ICD-10-CM | POA: Diagnosis not present

## 2024-05-25 DIAGNOSIS — E1129 Type 2 diabetes mellitus with other diabetic kidney complication: Secondary | ICD-10-CM | POA: Diagnosis not present

## 2024-05-25 DIAGNOSIS — N184 Chronic kidney disease, stage 4 (severe): Secondary | ICD-10-CM | POA: Diagnosis present

## 2024-05-25 DIAGNOSIS — I5042 Chronic combined systolic (congestive) and diastolic (congestive) heart failure: Secondary | ICD-10-CM | POA: Diagnosis present

## 2024-05-25 DIAGNOSIS — I255 Ischemic cardiomyopathy: Secondary | ICD-10-CM | POA: Diagnosis present

## 2024-05-25 DIAGNOSIS — M86672 Other chronic osteomyelitis, left ankle and foot: Secondary | ICD-10-CM | POA: Diagnosis not present

## 2024-05-25 DIAGNOSIS — R5383 Other fatigue: Secondary | ICD-10-CM | POA: Diagnosis not present

## 2024-05-25 DIAGNOSIS — I70244 Atherosclerosis of native arteries of left leg with ulceration of heel and midfoot: Secondary | ICD-10-CM | POA: Diagnosis not present

## 2024-05-25 DIAGNOSIS — Z5941 Food insecurity: Secondary | ICD-10-CM | POA: Diagnosis not present

## 2024-05-25 DIAGNOSIS — Z89512 Acquired absence of left leg below knee: Secondary | ICD-10-CM | POA: Diagnosis not present

## 2024-05-25 DIAGNOSIS — N1832 Chronic kidney disease, stage 3b: Secondary | ICD-10-CM | POA: Diagnosis not present

## 2024-05-25 DIAGNOSIS — L97529 Non-pressure chronic ulcer of other part of left foot with unspecified severity: Secondary | ICD-10-CM | POA: Diagnosis not present

## 2024-05-25 DIAGNOSIS — E1122 Type 2 diabetes mellitus with diabetic chronic kidney disease: Secondary | ICD-10-CM | POA: Diagnosis present

## 2024-05-25 DIAGNOSIS — E119 Type 2 diabetes mellitus without complications: Principal | ICD-10-CM

## 2024-05-25 DIAGNOSIS — Z7902 Long term (current) use of antithrombotics/antiplatelets: Secondary | ICD-10-CM

## 2024-05-25 DIAGNOSIS — I70262 Atherosclerosis of native arteries of extremities with gangrene, left leg: Secondary | ICD-10-CM | POA: Diagnosis not present

## 2024-05-25 DIAGNOSIS — M79676 Pain in unspecified toe(s): Secondary | ICD-10-CM | POA: Diagnosis not present

## 2024-05-25 DIAGNOSIS — R3911 Hesitancy of micturition: Secondary | ICD-10-CM | POA: Diagnosis present

## 2024-05-25 DIAGNOSIS — Z89431 Acquired absence of right foot: Secondary | ICD-10-CM

## 2024-05-25 DIAGNOSIS — E11621 Type 2 diabetes mellitus with foot ulcer: Secondary | ICD-10-CM | POA: Diagnosis not present

## 2024-05-25 DIAGNOSIS — E1142 Type 2 diabetes mellitus with diabetic polyneuropathy: Secondary | ICD-10-CM | POA: Diagnosis present

## 2024-05-25 DIAGNOSIS — M6259 Muscle wasting and atrophy, not elsewhere classified, multiple sites: Secondary | ICD-10-CM | POA: Diagnosis not present

## 2024-05-25 DIAGNOSIS — I509 Heart failure, unspecified: Secondary | ICD-10-CM | POA: Diagnosis not present

## 2024-05-25 DIAGNOSIS — R2689 Other abnormalities of gait and mobility: Secondary | ICD-10-CM | POA: Diagnosis not present

## 2024-05-25 DIAGNOSIS — L0889 Other specified local infections of the skin and subcutaneous tissue: Secondary | ICD-10-CM | POA: Diagnosis not present

## 2024-05-25 DIAGNOSIS — J449 Chronic obstructive pulmonary disease, unspecified: Secondary | ICD-10-CM | POA: Diagnosis present

## 2024-05-25 DIAGNOSIS — E11610 Type 2 diabetes mellitus with diabetic neuropathic arthropathy: Secondary | ICD-10-CM | POA: Diagnosis not present

## 2024-05-25 DIAGNOSIS — I739 Peripheral vascular disease, unspecified: Secondary | ICD-10-CM

## 2024-05-25 DIAGNOSIS — K219 Gastro-esophageal reflux disease without esophagitis: Secondary | ICD-10-CM | POA: Diagnosis not present

## 2024-05-25 DIAGNOSIS — M86072 Acute hematogenous osteomyelitis, left ankle and foot: Secondary | ICD-10-CM | POA: Diagnosis not present

## 2024-05-25 DIAGNOSIS — S81802D Unspecified open wound, left lower leg, subsequent encounter: Secondary | ICD-10-CM | POA: Diagnosis not present

## 2024-05-25 DIAGNOSIS — I10 Essential (primary) hypertension: Secondary | ICD-10-CM | POA: Diagnosis not present

## 2024-05-25 DIAGNOSIS — L089 Local infection of the skin and subcutaneous tissue, unspecified: Secondary | ICD-10-CM | POA: Diagnosis present

## 2024-05-25 DIAGNOSIS — E1159 Type 2 diabetes mellitus with other circulatory complications: Secondary | ICD-10-CM | POA: Diagnosis not present

## 2024-05-25 DIAGNOSIS — M86172 Other acute osteomyelitis, left ankle and foot: Secondary | ICD-10-CM | POA: Diagnosis not present

## 2024-05-25 DIAGNOSIS — I2510 Atherosclerotic heart disease of native coronary artery without angina pectoris: Secondary | ICD-10-CM | POA: Diagnosis present

## 2024-05-25 DIAGNOSIS — Z6835 Body mass index (BMI) 35.0-35.9, adult: Secondary | ICD-10-CM

## 2024-05-25 DIAGNOSIS — E872 Acidosis, unspecified: Secondary | ICD-10-CM | POA: Diagnosis not present

## 2024-05-25 DIAGNOSIS — S91302A Unspecified open wound, left foot, initial encounter: Secondary | ICD-10-CM | POA: Diagnosis not present

## 2024-05-25 DIAGNOSIS — I200 Unstable angina: Secondary | ICD-10-CM | POA: Diagnosis not present

## 2024-05-25 DIAGNOSIS — G4730 Sleep apnea, unspecified: Secondary | ICD-10-CM | POA: Diagnosis not present

## 2024-05-25 DIAGNOSIS — L97424 Non-pressure chronic ulcer of left heel and midfoot with necrosis of bone: Secondary | ICD-10-CM | POA: Diagnosis not present

## 2024-05-25 DIAGNOSIS — K3184 Gastroparesis: Secondary | ICD-10-CM | POA: Diagnosis present

## 2024-05-25 DIAGNOSIS — I5032 Chronic diastolic (congestive) heart failure: Secondary | ICD-10-CM | POA: Diagnosis not present

## 2024-05-25 DIAGNOSIS — Z5900 Homelessness unspecified: Secondary | ICD-10-CM | POA: Diagnosis not present

## 2024-05-25 DIAGNOSIS — L97929 Non-pressure chronic ulcer of unspecified part of left lower leg with unspecified severity: Secondary | ICD-10-CM | POA: Diagnosis not present

## 2024-05-25 DIAGNOSIS — I110 Hypertensive heart disease with heart failure: Secondary | ICD-10-CM | POA: Diagnosis not present

## 2024-05-25 DIAGNOSIS — I252 Old myocardial infarction: Secondary | ICD-10-CM

## 2024-05-25 DIAGNOSIS — L97509 Non-pressure chronic ulcer of other part of unspecified foot with unspecified severity: Secondary | ICD-10-CM | POA: Diagnosis not present

## 2024-05-25 DIAGNOSIS — Z7985 Long-term (current) use of injectable non-insulin antidiabetic drugs: Secondary | ICD-10-CM

## 2024-05-25 DIAGNOSIS — I129 Hypertensive chronic kidney disease with stage 1 through stage 4 chronic kidney disease, or unspecified chronic kidney disease: Secondary | ICD-10-CM | POA: Diagnosis not present

## 2024-05-25 DIAGNOSIS — M869 Osteomyelitis, unspecified: Secondary | ICD-10-CM | POA: Diagnosis not present

## 2024-05-25 DIAGNOSIS — Z955 Presence of coronary angioplasty implant and graft: Secondary | ICD-10-CM

## 2024-05-25 DIAGNOSIS — E1143 Type 2 diabetes mellitus with diabetic autonomic (poly)neuropathy: Secondary | ICD-10-CM | POA: Diagnosis not present

## 2024-05-25 DIAGNOSIS — E43 Unspecified severe protein-calorie malnutrition: Secondary | ICD-10-CM | POA: Diagnosis not present

## 2024-05-25 DIAGNOSIS — Z794 Long term (current) use of insulin: Secondary | ICD-10-CM

## 2024-05-25 DIAGNOSIS — Z5982 Transportation insecurity: Secondary | ICD-10-CM

## 2024-05-25 DIAGNOSIS — G546 Phantom limb syndrome with pain: Secondary | ICD-10-CM | POA: Diagnosis not present

## 2024-05-25 DIAGNOSIS — T8131XA Disruption of external operation (surgical) wound, not elsewhere classified, initial encounter: Secondary | ICD-10-CM | POA: Diagnosis present

## 2024-05-25 DIAGNOSIS — N179 Acute kidney failure, unspecified: Secondary | ICD-10-CM | POA: Diagnosis not present

## 2024-05-25 DIAGNOSIS — E1152 Type 2 diabetes mellitus with diabetic peripheral angiopathy with gangrene: Principal | ICD-10-CM | POA: Diagnosis present

## 2024-05-25 DIAGNOSIS — Z4781 Encounter for orthopedic aftercare following surgical amputation: Secondary | ICD-10-CM | POA: Diagnosis not present

## 2024-05-25 DIAGNOSIS — Z7982 Long term (current) use of aspirin: Secondary | ICD-10-CM

## 2024-05-25 DIAGNOSIS — G4733 Obstructive sleep apnea (adult) (pediatric): Secondary | ICD-10-CM | POA: Diagnosis present

## 2024-05-25 DIAGNOSIS — G8929 Other chronic pain: Secondary | ICD-10-CM | POA: Diagnosis not present

## 2024-05-25 DIAGNOSIS — E559 Vitamin D deficiency, unspecified: Secondary | ICD-10-CM | POA: Diagnosis not present

## 2024-05-25 DIAGNOSIS — Z9861 Coronary angioplasty status: Secondary | ICD-10-CM | POA: Diagnosis not present

## 2024-05-25 DIAGNOSIS — E1151 Type 2 diabetes mellitus with diabetic peripheral angiopathy without gangrene: Secondary | ICD-10-CM | POA: Diagnosis present

## 2024-05-25 LAB — CBC
HCT: 41.5 % (ref 39.0–52.0)
Hemoglobin: 13.5 g/dL (ref 13.0–17.0)
MCH: 29 pg (ref 26.0–34.0)
MCHC: 32.5 g/dL (ref 30.0–36.0)
MCV: 89.1 fL (ref 80.0–100.0)
Platelets: 205 10*3/uL (ref 150–400)
RBC: 4.66 MIL/uL (ref 4.22–5.81)
RDW: 15 % (ref 11.5–15.5)
WBC: 10.9 10*3/uL — ABNORMAL HIGH (ref 4.0–10.5)
nRBC: 0 % (ref 0.0–0.2)

## 2024-05-25 LAB — CBC WITH DIFFERENTIAL/PLATELET
Abs Immature Granulocytes: 0.07 10*3/uL (ref 0.00–0.07)
Basophils Absolute: 0 10*3/uL (ref 0.0–0.1)
Basophils Relative: 0 %
Eosinophils Absolute: 0 10*3/uL (ref 0.0–0.5)
Eosinophils Relative: 0 %
HCT: 41.1 % (ref 39.0–52.0)
Hemoglobin: 12.8 g/dL — ABNORMAL LOW (ref 13.0–17.0)
Immature Granulocytes: 1 %
Lymphocytes Relative: 14 %
Lymphs Abs: 1.2 10*3/uL (ref 0.7–4.0)
MCH: 28.4 pg (ref 26.0–34.0)
MCHC: 31.1 g/dL (ref 30.0–36.0)
MCV: 91.1 fL (ref 80.0–100.0)
Monocytes Absolute: 1.3 10*3/uL — ABNORMAL HIGH (ref 0.1–1.0)
Monocytes Relative: 15 %
Neutro Abs: 6.2 10*3/uL (ref 1.7–7.7)
Neutrophils Relative %: 70 %
Platelets: 176 10*3/uL (ref 150–400)
RBC: 4.51 MIL/uL (ref 4.22–5.81)
RDW: 14.9 % (ref 11.5–15.5)
WBC: 8.7 10*3/uL (ref 4.0–10.5)
nRBC: 0 % (ref 0.0–0.2)

## 2024-05-25 LAB — COMPREHENSIVE METABOLIC PANEL WITH GFR
ALT: 30 U/L (ref 0–44)
AST: 36 U/L (ref 15–41)
Albumin: 3 g/dL — ABNORMAL LOW (ref 3.5–5.0)
Alkaline Phosphatase: 123 U/L (ref 38–126)
Anion gap: 15 (ref 5–15)
BUN: 73 mg/dL — ABNORMAL HIGH (ref 8–23)
CO2: 19 mmol/L — ABNORMAL LOW (ref 22–32)
Calcium: 8.5 mg/dL — ABNORMAL LOW (ref 8.9–10.3)
Chloride: 101 mmol/L (ref 98–111)
Creatinine, Ser: 5.71 mg/dL — ABNORMAL HIGH (ref 0.61–1.24)
GFR, Estimated: 10 mL/min — ABNORMAL LOW (ref >60)
Glucose, Bld: 210 mg/dL — ABNORMAL HIGH (ref 70–99)
Potassium: 3.1 mmol/L — ABNORMAL LOW (ref 3.5–5.1)
Sodium: 135 mmol/L (ref 135–145)
Total Bilirubin: 0.4 mg/dL (ref 0.0–1.2)
Total Protein: 6.7 g/dL (ref 6.5–8.1)

## 2024-05-25 LAB — I-STAT CG4 LACTIC ACID, ED: Lactic Acid, Venous: 1.4 mmol/L (ref 0.5–1.9)

## 2024-05-25 LAB — C-REACTIVE PROTEIN: CRP: 0.9 mg/dL (ref <1.0)

## 2024-05-25 LAB — SEDIMENTATION RATE: Sed Rate: 46 mm/h — ABNORMAL HIGH (ref 0–16)

## 2024-05-25 LAB — HIV ANTIBODY (ROUTINE TESTING W REFLEX): HIV Screen 4th Generation wRfx: NONREACTIVE

## 2024-05-25 LAB — CREATININE, SERUM
Creatinine, Ser: 5.57 mg/dL — ABNORMAL HIGH (ref 0.61–1.24)
GFR, Estimated: 11 mL/min — ABNORMAL LOW (ref >60)

## 2024-05-25 LAB — GLUCOSE, CAPILLARY: Glucose-Capillary: 217 mg/dL — ABNORMAL HIGH (ref 70–99)

## 2024-05-25 MED ORDER — TICAGRELOR 90 MG PO TABS
90.0000 mg | ORAL_TABLET | Freq: Two times a day (BID) | ORAL | Status: DC
  Filled 2024-05-25 (×3): qty 1

## 2024-05-25 MED ORDER — GABAPENTIN 100 MG PO CAPS
100.0000 mg | ORAL_CAPSULE | Freq: Every day | ORAL | Status: DC
  Administered 2024-05-27 – 2024-06-05 (×9): 100 mg via ORAL
  Filled 2024-05-25 (×12): qty 1

## 2024-05-25 MED ORDER — PIPERACILLIN-TAZOBACTAM 3.375 G IVPB 30 MIN: 3.3750 g | Freq: Once | INTRAVENOUS | Status: DC

## 2024-05-25 MED ORDER — ENOXAPARIN SODIUM 30 MG/0.3ML IJ SOSY
30.0000 mg | PREFILLED_SYRINGE | INTRAMUSCULAR | Status: DC
  Administered 2024-05-27 – 2024-06-06 (×10): 30 mg via SUBCUTANEOUS
  Filled 2024-05-25 (×12): qty 0.3

## 2024-05-25 MED ORDER — DULOXETINE HCL 20 MG PO CPEP
20.0000 mg | ORAL_CAPSULE | Freq: Two times a day (BID) | ORAL | Status: DC
  Administered 2024-05-26 – 2024-06-06 (×18): 20 mg via ORAL
  Filled 2024-05-25 (×24): qty 1

## 2024-05-25 MED ORDER — FUROSEMIDE 40 MG PO TABS
40.0000 mg | ORAL_TABLET | Freq: Two times a day (BID) | ORAL | Status: DC
  Filled 2024-05-25 (×2): qty 1

## 2024-05-25 MED ORDER — LOSARTAN POTASSIUM 50 MG PO TABS
25.0000 mg | ORAL_TABLET | Freq: Every day | ORAL | Status: DC
  Filled 2024-05-25: qty 1

## 2024-05-25 MED ORDER — INSULIN ASPART 100 UNIT/ML IJ SOLN
0.0000 [IU] | Freq: Three times a day (TID) | INTRAMUSCULAR | Status: DC
  Administered 2024-05-27: 2 [IU] via SUBCUTANEOUS
  Administered 2024-05-27: 5 [IU] via SUBCUTANEOUS
  Administered 2024-05-27: 3 [IU] via SUBCUTANEOUS
  Administered 2024-05-29 – 2024-05-30 (×4): 2 [IU] via SUBCUTANEOUS
  Administered 2024-05-31: 5 [IU] via SUBCUTANEOUS
  Administered 2024-05-31: 2 [IU] via SUBCUTANEOUS
  Administered 2024-06-01 (×3): 3 [IU] via SUBCUTANEOUS
  Administered 2024-06-02: 2 [IU] via SUBCUTANEOUS

## 2024-05-25 MED ORDER — VANCOMYCIN HCL 2000 MG/400ML IV SOLN
2000.0000 mg | Freq: Once | INTRAVENOUS | Status: AC
  Filled 2024-05-25: qty 400

## 2024-05-25 MED ORDER — ISOSORBIDE MONONITRATE ER 60 MG PO TB24
120.0000 mg | ORAL_TABLET | Freq: Every day | ORAL | Status: DC
  Administered 2024-05-27 – 2024-06-06 (×11): 120 mg via ORAL
  Filled 2024-05-25 (×12): qty 2

## 2024-05-25 MED ORDER — ROSUVASTATIN CALCIUM 20 MG PO TABS
40.0000 mg | ORAL_TABLET | Freq: Every day | ORAL | Status: DC
  Filled 2024-05-25: qty 2

## 2024-05-25 MED ORDER — FAMOTIDINE 20 MG PO TABS
20.0000 mg | ORAL_TABLET | Freq: Every day | ORAL | Status: DC
  Filled 2024-05-25: qty 1

## 2024-05-25 MED ORDER — METOPROLOL SUCCINATE ER 50 MG PO TB24
100.0000 mg | ORAL_TABLET | Freq: Every day | ORAL | Status: DC
  Administered 2024-05-27 – 2024-06-06 (×10): 100 mg via ORAL
  Filled 2024-05-25 (×11): qty 2

## 2024-05-25 MED ORDER — PANTOPRAZOLE SODIUM 40 MG PO TBEC
40.0000 mg | DELAYED_RELEASE_TABLET | Freq: Two times a day (BID) | ORAL | Status: DC
  Administered 2024-05-27 – 2024-06-06 (×19): 40 mg via ORAL
  Filled 2024-05-25 (×24): qty 1

## 2024-05-25 MED ORDER — PIPERACILLIN-TAZOBACTAM IN DEX 2-0.25 GM/50ML IV SOLN
2.2500 g | Freq: Three times a day (TID) | INTRAVENOUS | Status: DC
  Administered 2024-05-27 (×3): 2.25 g via INTRAVENOUS
  Filled 2024-05-25 (×10): qty 50

## 2024-05-25 MED ORDER — INSULIN GLARGINE-YFGN 100 UNIT/ML ~~LOC~~ SOLN
20.0000 [IU] | Freq: Every day | SUBCUTANEOUS | Status: DC
  Administered 2024-05-27: 20 [IU] via SUBCUTANEOUS
  Filled 2024-05-25 (×5): qty 0.2

## 2024-05-25 MED ORDER — LATANOPROST 0.005 % OP SOLN
1.0000 [drp] | Freq: Every day | OPHTHALMIC | Status: DC
  Administered 2024-05-27 – 2024-06-05 (×8): 1 [drp] via OPHTHALMIC
  Filled 2024-05-25: qty 2.5

## 2024-05-25 MED ORDER — VANCOMYCIN VARIABLE DOSE PER UNSTABLE RENAL FUNCTION (PHARMACIST DOSING): Status: DC

## 2024-05-25 MED ORDER — VANCOMYCIN HCL IN DEXTROSE 1-5 GM/200ML-% IV SOLN: 1000.0000 mg | Freq: Once | INTRAVENOUS | Status: DC

## 2024-05-25 NOTE — H&P (Addendum)
 History and Physical    Patient: Joshua [name]:[id number] DOB: 02-19-1956 DOA: 05/25/2024 DOS: the patient was seen and examined on 05/25/2024 PCP: Ilsa Maltese, PA  Patient coming from: Home  Chief Complaint:  Chief Complaint  Patient presents with   Wound Infection   HPI: Joshua [name] is a 68 y.o. male with medical history significant of CAD s/p multiple PCIs, HFpEF, HTN, DM2 c/b neuropathy, s/p R TMA in 2017, chronic poorly healing LLE heel wound, CKD4, OSA, and obesity p/w chronic poorly healing diabetic LLE foot ulcer c/f osteomyelitis.  Pt states that he has been battling this LLE wound for the past two years. Since that time he has multiple I&Ds by podiatry followed by rounds of PO abx and wound care to no avail. Podiatry recommended proximal limb amputation in 11/2022 per chart review, and pt was evaluated by vascular surgery at that time and offered L BKA as well, but deferred. Pt presents today for IP eval after visiting his OP podiatrist today.  In the ED, pt AFVSS. Labs notable for K 3.1, BUN/Cr 73/5.71, lactate 1.4, ESR 46, and glucose 210. MRI on 6/4 showed c/f LLE osteomyelitis of cuboid, as well as fatty atrophy of the foot musculature and moderate muscle edema suggesting myofasciitis. Pt admitted to medicine with podiatry/vascular following for ongoing care.  Review of Systems: As mentioned in the history of present illness. All other systems reviewed and are negative. Past Medical History:  Diagnosis Date   Acute coronary syndrome (HCC)    Anemia    B12 deficiency    Chest pain    CHF (congestive heart failure) (HCC)    Chronic combined systolic and diastolic CHF (congestive heart failure) (HCC)    a. 07/2015 Echo: EF 45-50%, mod LVH, mid apical/antsept AK, Gr 1 DD.   Chronic low back pain    CKD (chronic kidney disease), stage III (HCC)    Constipation    COPD (chronic obstructive pulmonary disease) (HCC)    Coronary artery disease    a. 2012 s/p  MI/PCI: s/p DES to mid/dist RCA and overlapping DES to LAD;  b. 07/2015 Cath: LAD 10% ISR, D1 40(jailed), LCX 51m, OM2 30, OM3 30, RCA 72m ISR, 10d ISR; c. 07/2016 NSTEMI/PCI: LAD 85ost/p/m (3.5x20 Synergy DES overlapping prior stent), D1 40, LCX 89m, OM2 95 (2.75x16 Synergy DES), OM3 30, RCA 89m, 10d.   Depression    Diabetic gastroparesis (HCC)    Diabetic polyneuropathy (HCC)    DKA (diabetic ketoacidoses) 03/14/2018   GERD (gastroesophageal reflux disease)    Gout    Heart failure (HCC)    Heart murmur    History of MI (myocardial infarction)    Hyperlipemia    Hypertension    Hypertensive heart disease    Hypokalemia    Ischemic cardiomyopathy    a. 07/2015 Echo: EF 45-50%, mod LVH, mid-apicalanteroseptal AK, Gr1 DD.   Joint pain    Kidney problem    Morbid obesity (HCC)    OSA on CPAP    Osteoarthritis    Osteomyelitis (HCC)    a. 07/2016 MTP joint of R great toe s/p transmetatarsal amputation.   Other fatigue    Polyneuropathy in diabetes(357.2)    Pulmonary sarcoidosis (HCC)    problems w/it years ago; no problems anymore (07/03/2014)   Rectal bleeding    Shortness of breath    Shortness of breath on exertion    Sleep apnea    Stomach ulcer    Swallowing  difficulty    Type II diabetes mellitus (HCC)    Past Surgical History:  Procedure Laterality Date   ABDOMINAL AORTOGRAM W/LOWER EXTREMITY N/A 11/29/2022   Procedure: ABDOMINAL AORTOGRAM W/LOWER EXTREMITY;  Surgeon: Adine Hoof, MD;  Location: [facility] INVASIVE CV LAB;  Service: Cardiovascular;  Laterality: N/A;   AMPUTATION Right 07/24/2016   Procedure: TRANSMETATARSAL FOOT AMPUTATION;  Surgeon: Timothy Ford, MD;  Location: [facility] OR;  Service: Orthopedics;  Laterality: Right;   APPLICATION OF WOUND VAC Left 11/30/2022   Procedure: APPLICATION OF WOUND VAC;  Surgeon: Floyce Hutching, DPM;  Location: [facility] OR;  Service: Podiatry;  Laterality: Left;   BALLOON DILATION N/A 03/21/2018   Procedure: BALLOON DILATION;   Surgeon: Lanita Pitman, MD;  Location: [facility] ENDOSCOPY;  Service: Endoscopy;  Laterality: N/A;   BIOPSY  03/16/2022   Procedure: BIOPSY;  Surgeon: Baldo Bonds, MD;  Location: [facility] ENDOSCOPY;  Service: Endoscopy;;   BREAST LUMPECTOMY Right ~ 2000   benign   CARDIAC CATHETERIZATION N/A 07/30/2015   Procedure: Left Heart Cath and Coronary Angiography;  Surgeon: Odie Benne, MD;  Location: [facility] INVASIVE CV LAB;  Service: Cardiovascular;  Laterality: N/A;   CARDIAC CATHETERIZATION N/A 07/19/2016   Procedure: Left Heart Cath and Coronary Angiography;  Surgeon: Arty Binning, MD;  Location: [facility] INVASIVE CV LAB;  Service: Cardiovascular;  Laterality: N/A;   CARDIAC CATHETERIZATION N/A 07/29/2016   Procedure: Coronary Stent Intervention;  Surgeon: Arty Binning, MD;  Location: [facility] INVASIVE CV LAB;  Service: Cardiovascular;  Laterality: N/A;   CARDIAC CATHETERIZATION N/A 07/29/2016   Procedure: Intravascular Ultrasound/IVUS;  Surgeon: Arty Binning, MD;  Location: [facility] INVASIVE CV LAB;  Service: Cardiovascular;  Laterality: N/A;   CATARACT EXTRACTION W/ INTRAOCULAR LENS  IMPLANT, BILATERAL Bilateral 2014-2015   COLONOSCOPY WITH PROPOFOL  N/A 08/22/2017   Procedure: COLONOSCOPY WITH PROPOFOL ;  Surgeon: Baldo Bonds, MD;  Location: [facility] ENDOSCOPY;  Service: Endoscopy;  Laterality: N/A;   COLONOSCOPY WITH PROPOFOL  N/A 03/16/2022   Procedure: COLONOSCOPY WITH PROPOFOL ;  Surgeon: Baldo Bonds, MD;  Location: [facility] ENDOSCOPY;  Service: Endoscopy;  Laterality: N/A;   CORONARY ANGIOPLASTY WITH STENT PLACEMENT  10/31/2011   30 % MID ATRIOVENTRICULAR GROOVE CX STENOSIS. 90 % MID RCA.PTA TO LAD/STENTING OF TANDEM 60-70%. LAD STENOSIS 99% REDUCED TO 0%.3.0 X PROMUS DES POSTDILATED TO 3.25MM.   CORONARY ANGIOPLASTY WITH STENT PLACEMENT  07/03/2014   2   CORONARY STENT INTERVENTION N/A 04/09/2019   Procedure: CORONARY STENT INTERVENTION;  Surgeon: Lucendia Rusk, MD;  Location: [facility] INVASIVE CV LAB;   Service: Cardiovascular;  Laterality: N/A;   CORONARY STENT INTERVENTION N/A 08/22/2019   Procedure: CORONARY STENT INTERVENTION;  Surgeon: Swaziland, Peter M, MD;  Location: [facility] INVASIVE CV LAB;  Service: Cardiovascular;  Laterality: N/A;   ESOPHAGOGASTRODUODENOSCOPY (EGD) WITH PROPOFOL  N/A 03/21/2018   Procedure: ESOPHAGOGASTRODUODENOSCOPY (EGD) WITH PROPOFOL ;  Surgeon: Lanita Pitman, MD;  Location: [facility] ENDOSCOPY;  Service: Endoscopy;  Laterality: N/A;   ESOPHAGOGASTRODUODENOSCOPY (EGD) WITH PROPOFOL  N/A 03/16/2022   Procedure: ESOPHAGOGASTRODUODENOSCOPY (EGD) WITH PROPOFOL ;  Surgeon: Baldo Bonds, MD;  Location: [facility] ENDOSCOPY;  Service: Endoscopy;  Laterality: N/A;   GRAFT APPLICATION Left 11/30/2022   Procedure: GRAFT APPLICATION;  Surgeon: Floyce Hutching, DPM;  Location: [facility] OR;  Service: Podiatry;  Laterality: Left;   I & D EXTREMITY Right 10/30/2013   Procedure: IRRIGATION AND DEBRIDEMENT RIGHT SMALL FINGER POSSIBLE SECONDARY WOUND CLOSURE;  Surgeon: Hedy Living, MD;  Location: [facility] ORS;  Service: Orthopedics;  Laterality: Right;  Donzella Galley is available after 4pm   I & D EXTREMITY Right 11/01/2013   Procedure:  IRRIGATION AND DEBRIDEMENT RIGHT  PROXIMAL PHALANGEAL LEVEL AMPUTATION;  Surgeon: Hedy Living, MD;  Location: [facility] ORS;  Service: Orthopedics;  Laterality: Right;   INCISION AND DRAINAGE Right 10/28/2013   Procedure: INCISION AND DRAINAGE Right small finger;  Surgeon: Hedy Living, MD;  Location: [facility] ORS;  Service: Orthopedics;  Laterality: Right;   INCISION AND DRAINAGE OF WOUND Left 11/07/2022   Procedure: IRRIGATION AND DEBRIDEMENT WOUND;  Surgeon: Evertt Hoe, DPM;  Location: [facility] OR;  Service: Podiatry;  Laterality: Left;  Left foot ucler debridement and bone biopsy   LEFT HEART CATH Left 11/03/2011   Procedure: LEFT HEART CATH;  Surgeon: Millicent Ally, MD;  Location: [facility] CATH LAB;  Service: Cardiovascular;  Laterality: Left;   LEFT HEART CATH AND CORONARY  ANGIOGRAPHY N/A 04/09/2019   Procedure: LEFT HEART CATH AND CORONARY ANGIOGRAPHY;  Surgeon: Lucendia Rusk, MD;  Location: [facility] INVASIVE CV LAB;  Service: Cardiovascular;  Laterality: N/A;   LEFT HEART CATHETERIZATION WITH CORONARY ANGIOGRAM N/A 07/03/2014   Procedure: LEFT HEART CATHETERIZATION WITH CORONARY ANGIOGRAM;  Surgeon: Mickiel Albany, MD;  Location: [facility] CATH LAB;  Service: Cardiovascular;  Laterality: N/A;   PERCUTANEOUS CORONARY STENT INTERVENTION (PCI-S) Left 11/03/2011   Procedure: PERCUTANEOUS CORONARY STENT INTERVENTION (PCI-S);  Surgeon: Millicent Ally, MD;  Location: [facility] CATH LAB;  Service: Cardiovascular;  Laterality: Left;   RIGHT/LEFT HEART CATH AND CORONARY ANGIOGRAPHY N/A 08/22/2019   Procedure: RIGHT/LEFT HEART CATH AND CORONARY ANGIOGRAPHY;  Surgeon: Swaziland, Peter M, MD;  Location: [facility] INVASIVE CV LAB;  Service: Cardiovascular;  Laterality: N/A;   TOE AMPUTATION Right ~ 2010   2nd toe   TOE AMPUTATION Right 2012   3rd toe   WOUND DEBRIDEMENT Left 11/30/2022   Procedure: DEBRIDEMENT WOUND;  Surgeon: Floyce Hutching, DPM;  Location: [facility] OR;  Service: Podiatry;  Laterality: Left;   Social History:  reports that he has never smoked. He has never used smokeless tobacco. He reports current alcohol use. He reports that he does not use drugs.  Allergies  Allergen Reactions   Amlodipine  Besylate     Other Reaction(s): leg cramps   Chlorthalidone Other (See Comments)    Causes dehydration, kidneys shut down  Other Reaction(s): Kidney Failure    Family History  Problem Relation Age of Onset   Sudden death Mother    Heart attack Maternal Aunt    Diabetes Maternal Grandmother    Hypertension Maternal Grandmother    Sudden death Other    Neuropathy Neg Hx     Prior to Admission medications   Medication Sig Start Date End Date Taking? Authorizing Provider  acetaminophen  (TYLENOL ) 325 MG tablet Take 2 tablets (650 mg total) by mouth every 6 (six) hours as needed  for mild pain (or Fever >/= 101). 07/13/23  Yes Jackolyn Masker, MD  allopurinol  (ZYLOPRIM ) 100 MG tablet Take 1 tablet (100 mg total) by mouth daily. Patient taking differently: Take 100 mg by mouth in the morning. 05/24/24  Yes   aspirin  EC 81 MG tablet Take 1 tablet (81 mg total) by mouth daily. Patient taking differently: Take 81 mg by mouth in the morning. 07/13/23  Yes Jackolyn Masker, MD  cyclobenzaprine  (FLEXERIL ) 10 MG tablet Take 1 tablet (10 mg total) by mouth 3 (three) times daily as needed. 03/29/24  Yes Farris Hong, PA-C  DULoxetine  (CYMBALTA ) 20 MG capsule Take  1 capsule (20 mg total) by mouth 2 (two) times daily. 05/24/24  Yes   famotidine  (PEPCID ) 20 MG tablet Take 1 tablet (20 mg total) by mouth at bedtime. 07/13/23 05/25/24 Yes Jackolyn Masker, MD  furosemide  (LASIX ) 40 MG tablet Take 1 tablet (40 mg total) by mouth 2 (two) times daily. 05/24/24  Yes   gabapentin  (NEURONTIN ) 100 MG capsule Take 1 capsule (100 mg total) by mouth at bedtime. 05/24/24  Yes   insulin  glargine (LANTUS  SOLOSTAR) 100 UNIT/ML Solostar Pen Inject 25 Units into the skin every morning. 04/10/24  Yes Emilie Harden, MD  insulin  lispro (HUMALOG  KWIKPEN) 100 UNIT/ML KwikPen Inject 5-14 Units into the skin 3 (three) times daily with meals. Inject 5 units with meals, add the following per glucose level. CBG 70 - 120: 0 units CBG 121 - 150: 1 unit CBG 151 - 200: 2 units CBG 201 - 250: 3 units CBG 251 - 300: 5 units CBG 301 - 350: 7 units CBG 351 - 400: 9 units MDD: 50 units 02/23/24  Yes Emilie Harden, MD  isosorbide  mononitrate (IMDUR ) 60 MG 24 hr tablet Take 2 tablets by mouth in the morning AND 1 tablet every evening. Patient taking differently: Take 2 tablets by mouth in the morning AND then take 1 tablet by mouth every evening. 10/15/23  Yes Jackolyn Masker, MD  latanoprost  (XALATAN ) 0.005 % ophthalmic solution Place 1 drop into both eyes at bedtime. 10/15/23  Yes Jackolyn Masker, MD  losartan  (COZAAR ) 25 MG tablet Take 1  tablet (25 mg total) by mouth daily. Patient taking differently: Take 25 mg by mouth in the morning. 05/24/24  Yes   metoprolol  succinate (TOPROL -XL) 100 MG 24 hr tablet Take 1 tablet (100 mg total) by mouth daily. Patient taking differently: Take 100 mg by mouth in the morning. 05/24/24  Yes   nitroGLYCERIN  (NITROSTAT ) 0.4 MG SL tablet Place 1 tablet (0.4 mg total) under the tongue every 5 (five) minutes as needed for chest pain. 07/13/23  Yes Jackolyn Masker, MD  pantoprazole  (PROTONIX ) 40 MG tablet Take 1 tablet (40 mg total) by mouth 2 (two) times daily. 07/13/23 05/25/24 Yes Jackolyn Masker, MD  rosuvastatin  (CRESTOR ) 40 MG tablet Take 1 tablet (40 mg total) by mouth daily. Patient taking differently: Take 40 mg by mouth in the morning. 02/20/24 05/25/24 Yes Millicent Ally, MD  Semaglutide , 1 MG/DOSE, (OZEMPIC , 1 MG/DOSE,) 4 MG/3ML SOPN Inject 1 mg into the skin once a week. Patient taking differently: Inject 1 mg into the skin once a week. On Thursday 08/31/23  Yes Emilie Harden, MD  SILVER  SULFADIAZINE  EX Apply 1 Application topically 3 (three) times a week. No set days for the patient.   Yes [provider]  ticagrelor  (BRILINTA ) 90 MG TABS tablet Take 1 tablet (90 mg total) by mouth 2 (two) times daily. 10/15/23  Yes Jackolyn Masker, MD  Blood Glucose Monitoring Suppl (BLOOD GLUCOSE MONITOR SYSTEM) w/Device KIT Use in the morning, at noon, and at bedtime. 07/27/23   Ronni Colace, DO  Continuous Glucose Receiver (FREESTYLE LIBRE 3 READER) DEVI Use as advised 08/31/23   Emilie Harden, MD  Continuous Glucose Sensor (FREESTYLE LIBRE 3 PLUS SENSOR) MISC 1 each by Does not apply route every 14 (fourteen) days. 08/31/23   Emilie Harden, MD  Insulin  Pen Needle 32G X 4 MM MISC Use with Lantus  07/13/23       Physical Exam: Vitals:   05/25/24 1331 05/25/24 1332 05/25/24 1500 05/25/24 1515  BP: 132/70  126/85 136/74  Pulse: 83  80 79  Resp: 16  (!) 22 20  Temp:  98.1 F (36.7 C)    TempSrc:   Oral    SpO2: 100%  100% 100%  Weight:  109.3 kg    Height:  5' 9 (1.753 m)     General: Alert, oriented x3, resting comfortably in no acute distress Respiratory: Lungs clear to auscultation bilaterally with normal respiratory effort; no w/r/r Cardiovascular: Regular rate and rhythm w/o m/r/g   Data Reviewed:  Lab Results  Component Value Date   WBC 8.7 05/25/2024   HGB 12.8 (L) 05/25/2024   HCT 41.1 05/25/2024   MCV 91.1 05/25/2024   PLT 176 05/25/2024   Lab Results  Component Value Date   GLUCOSE 210 (H) 05/25/2024   CALCIUM  8.5 (L) 05/25/2024   NA 135 05/25/2024   K 3.1 (L) 05/25/2024   CO2 19 (L) 05/25/2024   CL 101 05/25/2024   BUN 73 (H) 05/25/2024   CREATININE 5.71 (H) 05/25/2024   Lab Results  Component Value Date   ALT 30 05/25/2024   AST 36 05/25/2024   ALKPHOS 123 05/25/2024   BILITOT 0.4 05/25/2024   Lab Results  Component Value Date   INR 1.1 07/10/2023   INR 1.2 11/26/2022   INR 0.9 04/06/2019    Radiology: No results found.  Assessment and Plan: 53M h/o CAD s/p multiple PCIs, HFpEF, HTN, DM2 c/b neuropathy p/w chronic poorly healing diabetic LLE foot ulcer c/f osteomyelitis.  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 Inpatient status is judged to be reasonable and necessary in order to provide the required intensity of service to ensure the patient's safety. The patient's  presenting symptoms, physical exam findings, and initial radiographic and laboratory data in the context of their chronic comorbidities is felt to place them at high risk for further clinical deterioration. Furthermore, it is not anticipated that the patient will be medically stable for discharge from the hospital within 2 midnights of admission.   * I certify that at the point of admission it is my clinical judgment that the patient will require inpatient hospital care spanning beyond 2 midnights from the point of admission due to high intensity of service, high risk for further deterioration and high frequency of surveillance required.*   ------- I spent 55 minutes reviewing previous labs/notes, obtaining separate history at the bedside, counseling/discussing the treatment plan outlined above, ordering medications/tests, and performing clinical documentation.  Author: Arne Langdon, MD 05/25/2024 5:43 PM  For on call review [email or web address].

## 2024-05-25 NOTE — ED Notes (Signed)
Help get patient into a gown on the monitor did EKG shown to er provider patient is resting with call bell in reach  

## 2024-05-25 NOTE — Progress Notes (Signed)
 Pharmacy Antibiotic Note  Joshua [name] is a 68 y.o. male for which pharmacy has been consulted for vancomycin  and zosyn  dosing for wound infection.  Patient with a history of CAD s/p PCI, HF, HTN, T2DM, LLE heel wound, CKD, OSA, obesity. Patient presenting with chronic poorly healing diabetic LLE foot ulcer.  SCr 5.71 - AKI WBC 8.7; LA 1.4; T 98.1; HR 79; RR 20  Plan: Zosyn  2.25g q8h Vancomycin  2000 mg once, subsequent dosing as indicated per random vancomycin  level until renal function stable and/or improved, at which time scheduled dosing can be considered Monitor WBC, fever, renal function, cultures De-escalate when able  Height: 5' 9 (175.3 cm) Weight: 109.3 kg (240 lb 15.4 oz) IBW/kg (Calculated) : 70.7  Temp (24hrs), Avg:98.5 F (36.9 C), Min:98.1 F (36.7 C), Max:98.8 F (37.1 C)  Recent Labs  Lab 05/25/24 1354 05/25/24 1402  WBC 8.7  --   CREATININE 5.71*  --   LATICACIDVEN  --  1.4    Estimated Creatinine Clearance: 15.7 mL/min (A) (by C-G formula based on SCr of 5.71 mg/dL (H)).    Allergies  Allergen Reactions   Amlodipine  Besylate     Other Reaction(s): leg cramps   Chlorthalidone Other (See Comments)    Causes dehydration, kidneys shut down  Other Reaction(s): Kidney Failure   Microbiology results: Pending  Thank you for allowing pharmacy to be a part of this patient's care.  Dionicio Fray, PharmD, BCPS 05/25/2024 6:35 PM ED Clinical Pharmacist -  [phone]

## 2024-05-25 NOTE — ED Provider Notes (Signed)
  Physical Exam  BP 132/70 (BP Location: Left Arm)   Pulse 83   Temp 98.1 F (36.7 C) (Oral)   Resp 16   Ht 5' 9 (1.753 m)   Wt 109.3 kg   SpO2 100%   BMI 35.58 kg/m   Physical Exam  Procedures  Procedures  ED Course / MDM   Clinical Course as of 05/25/24 1737  Fri May 25, 2024  1453 68 yo M hx of DM and foot wound who will need to be admitted for L foot osteo. Has an OP MRI that shows possible osteo on 6/4. Finished oral doxy and had debriedment today with podiatry in clinic. Podiatry (Dr Clydia Dart) will see him. Also recommending vascular fu.  [RP]  1736 Lab work shows worsening renal function.  Bladder scan ordered.  Dr Sulema Endo from hospitalist to admit the patient. [RP]    Clinical Course User Index [RP] Ninetta Basket, MD   Medical Decision Making Amount and/or Complexity of Data Reviewed Labs: ordered.  Risk Prescription drug management. Decision regarding hospitalization.      Ninetta Basket, MD 05/25/24 [phone]

## 2024-05-25 NOTE — ED Provider Notes (Addendum)
 [name]GENC[facility] DEPARTMENT AT [facility] Provider Note   CSN: [id number] Arrival date & time: 05/25/24  1329     Patient presents with: Wound Infection   NAHEIM BURGEN is a 68 y.o. male.   HPI   68 year old male presents emergency department with chronic wound in the plantar aspect of the left foot.  Follows with podiatry.  Was seen in the office today, had debridement and had concern for deeper infection and was referred here.  There is an MRI from about a week ago that questions osteomyelitis in the foot.  Patient is diabetic.  He denies any fever but endorses chills and generalized unwell feeling.  Prior to Admission medications   Medication Sig Start Date End Date Taking? Authorizing Provider  acetaminophen  (TYLENOL ) 325 MG tablet Take 2 tablets (650 mg total) by mouth every 6 (six) hours as needed for mild pain (or Fever >/= 101). 07/13/23   Jackolyn Masker, MD  allopurinol  (ZYLOPRIM ) 100 MG tablet Take 1 tablet (100 mg total) by mouth daily. 07/13/23 05/25/24  Jackolyn Masker, MD  allopurinol  (ZYLOPRIM ) 100 MG tablet Take 1 tablet (100 mg total) by mouth daily. 05/24/24     aspirin  EC 81 MG tablet Take 1 tablet (81 mg total) by mouth daily. 07/13/23   Jackolyn Masker, MD  Blood Glucose Monitoring Suppl (BLOOD GLUCOSE MONITOR SYSTEM) w/Device KIT Use in the morning, at noon, and at bedtime. 07/27/23   Ronni Colace, DO  Cholecalciferol  (VITAMIN D ) 50 MCG (2000 UT) tablet Take 1 tablet (2,000 Units total) by mouth daily. 07/28/23   Ronni Colace, DO  Continuous Glucose Receiver (FREESTYLE LIBRE 3 READER) DEVI Use as advised 08/31/23   Emilie Harden, MD  Continuous Glucose Sensor (FREESTYLE LIBRE 3 PLUS SENSOR) MISC 1 each by Does not apply route every 14 (fourteen) days. 08/31/23   Emilie Harden, MD  cyclobenzaprine  (FLEXERIL ) 10 MG tablet Take 1 tablet (10 mg total) by mouth 3 (three) times daily as needed. 03/29/24   Farris Hong, PA-C  doxycycline  (VIBRA -TABS) 100 MG  tablet Take 1 tablet (100 mg total) by mouth 2 (two) times daily. 05/11/24   Charity Conch, DPM  DULoxetine  (CYMBALTA ) 20 MG capsule Take 1 capsule (20 mg total) by mouth 2 (two) times daily. 07/13/23 05/25/24  Jackolyn Masker, MD  DULoxetine  (CYMBALTA ) 20 MG capsule Take 1 capsule (20 mg total) by mouth 2 (two) times daily. 05/24/24     famotidine  (PEPCID ) 20 MG tablet Take 1 tablet (20 mg total) by mouth at bedtime. 07/13/23 05/25/24  Jackolyn Masker, MD  furosemide  (LASIX ) 40 MG tablet Take 1 tablet (40 mg total) by mouth 2 (two) times daily. 07/13/23 05/25/24  Jackolyn Masker, MD  furosemide  (LASIX ) 40 MG tablet Take 1 tablet (40 mg total) by mouth 2 (two) times daily. 05/24/24     gabapentin  (NEURONTIN ) 100 MG capsule Take 1 capsule (100 mg total) by mouth at bedtime. 05/24/24     gentamicin  ointment (GARAMYCIN ) 0.1 % Apply 1 Application topically daily. Apply to wound daily 04/15/24   Floyce Hutching, DPM  insulin  glargine (LANTUS  SOLOSTAR) 100 UNIT/ML Solostar Pen Inject 25 Units into the skin every morning. 04/10/24   Emilie Harden, MD  insulin  lispro (HUMALOG  KWIKPEN) 100 UNIT/ML KwikPen Inject 5-14 Units into the skin 3 (three) times daily with meals. Inject 5 units with meals, add the following per glucose level. CBG 70 - 120: 0 units CBG 121 - 150: 1 unit CBG 151 - 200:  2 units CBG 201 - 250: 3 units CBG 251 - 300: 5 units CBG 301 - 350: 7 units CBG 351 - 400: 9 units MDD: 50 units 02/23/24   Emilie Harden, MD  Insulin  Pen Needle 32G X 4 MM MISC Use with Lantus  07/13/23     isosorbide  mononitrate (IMDUR ) 60 MG 24 hr tablet Take 2 tablets by mouth in the morning AND 1 tablet every evening. 10/15/23   Jackolyn Masker, MD  latanoprost  (XALATAN ) 0.005 % ophthalmic solution Place 1 drop into both eyes at bedtime. 10/15/23   Jackolyn Masker, MD  losartan  (COZAAR ) 25 MG tablet Take 25 mg by mouth daily.    [provider]  losartan  (COZAAR ) 25 MG tablet Take 1 tablet (25 mg total) by mouth daily. 05/24/24      metoprolol  succinate (TOPROL -XL) 100 MG 24 hr tablet Take 100 mg by mouth daily. Take with or immediately following a meal.    [provider]  metoprolol  succinate (TOPROL -XL) 100 MG 24 hr tablet Take 1 tablet (100 mg total) by mouth daily. 05/24/24     nitroGLYCERIN  (NITROSTAT ) 0.4 MG SL tablet Place 1 tablet (0.4 mg total) under the tongue every 5 (five) minutes as needed for chest pain. 07/13/23   Jackolyn Masker, MD  pantoprazole  (PROTONIX ) 40 MG tablet Take 1 tablet (40 mg total) by mouth 2 (two) times daily. 07/13/23 05/25/24  Jackolyn Masker, MD  rosuvastatin  (CRESTOR ) 40 MG tablet Take 1 tablet (40 mg total) by mouth daily. 02/20/24 05/25/24  Millicent Ally, MD  Semaglutide , 1 MG/DOSE, (OZEMPIC , 1 MG/DOSE,) 4 MG/3ML SOPN Inject 1 mg into the skin once a week. 08/31/23   Emilie Harden, MD  ticagrelor  (BRILINTA ) 90 MG TABS tablet Take 1 tablet (90 mg total) by mouth 2 (two) times daily. 10/15/23   Jackolyn Masker, MD  Vitamin D , Ergocalciferol , (DRISDOL ) 1.25 MG (50000 UNIT) CAPS capsule Take 50,000 Units by mouth every 7 (seven) days.    [provider]    Allergies: Amlodipine  besylate and Chlorthalidone    Review of Systems  Constitutional:  Positive for chills and fatigue. Negative for fever.  Respiratory:  Negative for shortness of breath.   Cardiovascular:  Negative for chest pain.  Gastrointestinal:  Negative for abdominal pain, diarrhea and vomiting.  Musculoskeletal:        + chronic left foot ulcer  Skin:  Negative for rash.  Neurological:  Negative for headaches.    Updated Vital Signs BP 132/70 (BP Location: Left Arm)   Pulse 83   Temp 98.1 F (36.7 C) (Oral)   Resp 16   Ht 5' 9 (1.753 m)   Wt 109.3 kg   SpO2 100%   BMI 35.58 kg/m   Physical Exam Vitals and nursing note reviewed.  Constitutional:      Appearance: Normal appearance.  HENT:     Head: Normocephalic.     Mouth/Throat:     Mouth: Mucous membranes are moist.   Cardiovascular:      Rate and Rhythm: Normal rate.  Pulmonary:     Effort: Pulmonary effort is normal. No respiratory distress.  Abdominal:     Palpations: Abdomen is soft.     Tenderness: There is no abdominal tenderness.   Musculoskeletal:     Comments: Chronic ulcer on the plantar aspect of the left foot dressing in place, the foot is warm and perfused.  No significant odor or purulent discharge.   Skin:    General: Skin is warm.  Neurological:     Mental Status: He is alert and oriented to person, place, and time. Mental status is at baseline.   Psychiatric:        Mood and Affect: Mood normal.     (all labs ordered are listed, but only abnormal results are displayed) Labs Reviewed  COMPREHENSIVE METABOLIC PANEL WITH GFR  CBC WITH DIFFERENTIAL/PLATELET  SEDIMENTATION RATE  C-REACTIVE PROTEIN  I-STAT CG4 LACTIC ACID, ED    EKG: None  Radiology: No results found.   Procedures   Medications Ordered in the ED - No data to display  Clinical Course as of 05/25/24 1502  Fri May 25, 2024  1453 68 yo M hx of DM and foot wound who will need to be admitted for L foot osteo. Has an OP MRI that shows possible osteo on 6/4. Finished oral doxy and had debriedment today with podiatry in clinic. Podiatry (Dr Clydia Dart) will see him. Also recommending vascular fu.  [RP]    Clinical Course User Index [RP] Ninetta Basket, MD                                 Medical Decision Making Amount and/or Complexity of Data Reviewed Labs: ordered.   68 year old male presents emergency department with concern for chronic left foot ulcer.  Outpatient MRI from about a week ago questioned osteomyelitis of the cuboid.  Was seen by podiatry today who did bedside debridement and recommended admission, IV antibiotics.  Vitals are normal and stable on arrival.  After reviewing the MRI agree with admission, IV antibiotics.  Patient is also diabetic.  This will be ordered.  Blood work is pending but once this is  resulted we will plan for medical admission.  Patient signed out to Dr. Adan Holms.     Final diagnoses:  None    ED Discharge Orders     None          Flonnie Humphrey, DO 05/25/24 1506    Keaghan Bowens, Sidra Dredge, DO 05/25/24 1507

## 2024-05-25 NOTE — ED Triage Notes (Signed)
 Pt BIB [facility] from doctors office for infected wound on left foot. PT is wearing a boot. Sent here for IV ABX.   140/88 HR 97 97% CBG 237

## 2024-05-25 NOTE — Progress Notes (Signed)
 Subjective: Chief Complaint  Patient presents with   Wound Check    RM 11 Patient is here to follow-up on left ulcer. Wound is exposed, no bleeding. Patient cleanses and apply silvadene  cream three times weekly.    68 year old male presents the office today for follow-up evaluation of the wound on the left.  He has been under the care of other providers for this wound as well.  Possible that with an MRI and he presents today for follow-up evaluation of the wound.  He does not report any fevers or chills but states that he feels off at times.  He thinks this could be from his diet.    He finishes antibiotics today.  Vitals:   05/25/24 1228  BP: 120/78  Pulse: 85  Resp: 16  Temp: 98.8 F (37.1 C)  SpO2: 99%      Objective: AAO x3, NAD DP/PT pulses decreased bilaterally, CRT less than 3 seconds As pictured below there is a thick hyperkeratotic lesion along the fifth metatarsal base and wound going plantarly with macerated tissue.  Today there is a blister around this and there is almost dishwater type drainage coming from the wound.  See picture below there is increased necrotic tissue present on the plantar aspect.  There is no crepitation noted at this time.  Ankle range of motion intact.  There is no crepitation of the leg.  He has some mild discomfort on the medial aspect of the this is a chronic issue or not you he reports. No pain with calf compression, swelling, warmth, erythema       Assessment: Ulceration left foot, infection  Plan:  Ulceration left foot/infection - I reviewed the MRI with the patient.  I sharply debrided the blister and noticed dishwater type drainage with increased necrotic tissue present.  I am concerned about developing deeper infection.  He is currently afebrile and vital signs are stable.  I do recommend go to the emergency room for further evaluation likely admission to the hospital.  Upon admission to the hospital service we will see the  patient.  Recommend starting broad-spectrum antibiotic.  Also recommend repeat arterial studies. I had put in vascular consult last appointment and that is pending. He is known to vascular surgery and would benefit from a vascular consult while inpatient as well.  - Discussed amputation risk. - Contacted [place] [facility]) to let them know of his arrival    No follow-ups on file.   Charity Conch DPM

## 2024-05-25 NOTE — Progress Notes (Signed)
 ED Pharmacy Antibiotic Sign Off An antibiotic consult was received from an ED provider for zosyn  per pharmacy dosing for wound infection. A chart review was completed to assess appropriateness.   The following one time order(s) were placed:  Zosyn  3.375g IV once   Further antibiotic and/or antibiotic pharmacy consults should be ordered by the admitting provider if indicated.   Thank you for allowing pharmacy to be a part of this patient's care.   Harvest Lineman, [facility]  Clinical Pharmacist 05/25/24 4:05 PM

## 2024-05-26 DIAGNOSIS — M86672 Other chronic osteomyelitis, left ankle and foot: Secondary | ICD-10-CM | POA: Diagnosis not present

## 2024-05-26 DIAGNOSIS — M86172 Other acute osteomyelitis, left ankle and foot: Secondary | ICD-10-CM

## 2024-05-26 DIAGNOSIS — E11628 Type 2 diabetes mellitus with other skin complications: Secondary | ICD-10-CM

## 2024-05-26 DIAGNOSIS — L089 Local infection of the skin and subcutaneous tissue, unspecified: Secondary | ICD-10-CM | POA: Diagnosis not present

## 2024-05-26 LAB — BASIC METABOLIC PANEL WITH GFR
Anion gap: 14 (ref 5–15)
BUN: 71 mg/dL — ABNORMAL HIGH (ref 8–23)
CO2: 21 mmol/L — ABNORMAL LOW (ref 22–32)
Calcium: 8.9 mg/dL (ref 8.9–10.3)
Chloride: 104 mmol/L (ref 98–111)
Creatinine, Ser: 5.55 mg/dL — ABNORMAL HIGH (ref 0.61–1.24)
GFR, Estimated: 11 mL/min — ABNORMAL LOW (ref >60)
Glucose, Bld: 168 mg/dL — ABNORMAL HIGH (ref 70–99)
Potassium: 3.1 mmol/L — ABNORMAL LOW (ref 3.5–5.1)
Sodium: 139 mmol/L (ref 135–145)

## 2024-05-26 LAB — URINALYSIS, COMPLETE (UACMP) WITH MICROSCOPIC
Bilirubin Urine: NEGATIVE
Glucose, UA: NEGATIVE mg/dL
Ketones, ur: NEGATIVE mg/dL
Leukocytes,Ua: NEGATIVE
Nitrite: NEGATIVE
Protein, ur: 100 mg/dL — AB
Specific Gravity, Urine: 1.008 (ref 1.005–1.030)
pH: 5 (ref 5.0–8.0)

## 2024-05-26 LAB — CBC
HCT: 40.3 % (ref 39.0–52.0)
Hemoglobin: 13 g/dL (ref 13.0–17.0)
MCH: 28.7 pg (ref 26.0–34.0)
MCHC: 32.3 g/dL (ref 30.0–36.0)
MCV: 89 fL (ref 80.0–100.0)
Platelets: 156 10*3/uL (ref 150–400)
RBC: 4.53 MIL/uL (ref 4.22–5.81)
RDW: 15 % (ref 11.5–15.5)
WBC: 8.4 10*3/uL (ref 4.0–10.5)
nRBC: 0 % (ref 0.0–0.2)

## 2024-05-26 LAB — GLUCOSE, CAPILLARY
Glucose-Capillary: 179 mg/dL — ABNORMAL HIGH (ref 70–99)
Glucose-Capillary: 172 mg/dL — ABNORMAL HIGH (ref 70–99)
Glucose-Capillary: 187 mg/dL — ABNORMAL HIGH (ref 70–99)
Glucose-Capillary: 197 mg/dL — ABNORMAL HIGH (ref 70–99)

## 2024-05-26 LAB — MAGNESIUM: Magnesium: 2.1 mg/dL (ref 1.7–2.4)

## 2024-05-26 MED ORDER — POTASSIUM CHLORIDE 20 MEQ PO PACK
20.0000 meq | PACK | Freq: Once | ORAL | Status: AC
  Filled 2024-05-26: qty 1

## 2024-05-26 MED ORDER — ACETAMINOPHEN 500 MG PO TABS
1000.0000 mg | ORAL_TABLET | Freq: Four times a day (QID) | ORAL | Status: DC | PRN
  Administered 2024-05-29 – 2024-06-05 (×2): 1000 mg via ORAL
  Filled 2024-05-26 (×3): qty 2

## 2024-05-26 MED ORDER — ROSUVASTATIN CALCIUM 5 MG PO TABS
10.0000 mg | ORAL_TABLET | Freq: Every day | ORAL | Status: DC
  Administered 2024-05-27 – 2024-06-06 (×11): 10 mg via ORAL
  Filled 2024-05-26 (×11): qty 2

## 2024-05-26 MED ORDER — ASPIRIN 81 MG PO CHEW
81.0000 mg | CHEWABLE_TABLET | Freq: Every day | ORAL | Status: DC
  Administered 2024-05-27 – 2024-06-06 (×10): 81 mg via ORAL
  Filled 2024-05-26 (×11): qty 1

## 2024-05-26 MED ORDER — FAMOTIDINE 20 MG PO TABS
10.0000 mg | ORAL_TABLET | Freq: Every day | ORAL | Status: DC
  Administered 2024-05-27 – 2024-06-05 (×9): 10 mg via ORAL
  Filled 2024-05-26 (×11): qty 1

## 2024-05-26 MED ORDER — TAMSULOSIN HCL 0.4 MG PO CAPS
0.4000 mg | ORAL_CAPSULE | Freq: Every day | ORAL | Status: DC
  Administered 2024-05-27 – 2024-06-06 (×10): 0.4 mg via ORAL
  Filled 2024-05-26 (×12): qty 1

## 2024-05-26 NOTE — Plan of Care (Signed)

## 2024-05-26 NOTE — Progress Notes (Signed)
   ASSESSMENT/PLAN OF CARE 1.  Diabetic foot ulcer left 2.  History of Charcot neuroarthropathy left foot 3.  Concern for underlying osteomyelitis left foot 4.  S/P TMA RLE.  2017  -Had another discussion with the patient this afternoon.  He has had ongoing multiple surgeries to the left foot and pursued multiple limb salvage attempts.  At this point I do not see any benefit of additional debridement with wound VAC application and bone biopsy.  BKA has been recommended in the past however he deferred.  Today we had another lengthy discussion regarding BKA and he is amenable to this plan -Notified hospitalist, Dr. Plainview Hammans, who will consult vascular to reevaluate BKA LLE -Podiatry will sign off.  Please reconsult if needed  Dot Gazella, DPM [facility]  Dr. Dot Gazella, DPM    [address] [place], [place] [address]                Office [phone]  Fax [phone]

## 2024-05-26 NOTE — Consult Note (Signed)
 PODIATRY CONSULTATION  NAME Joshua [name] MRN [id number] DOB [birth year] DOA 05/25/2024   Reason for consult:  Chief Complaint  Patient presents with   Wound Infection    Consulting physician: Joette Mustard MD, Arlin Benes, ED  History of present illness: 68 y.o. male PMHx CAD with his/P multiple PCI's, T2DM W/neuropathy, h/o TMA RLE 2017, CKD stage IV admitted for worsening infection left foot concerning for underlying osteomyelitis.  Ulcer present for about 2 years now.  Multiple incision and drainage procedures and oral antibiotics and wound care. Podiatry consulted upon admission  Past Medical History:  Diagnosis Date   Acute coronary syndrome (HCC)    Anemia    B12 deficiency    Chest pain    CHF (congestive heart failure) (HCC)    Chronic combined systolic and diastolic CHF (congestive heart failure) (HCC)    a. 07/2015 Echo: EF 45-50%, mod LVH, mid apical/antsept AK, Gr 1 DD.   Chronic low back pain    CKD (chronic kidney disease), stage III (HCC)    Constipation    COPD (chronic obstructive pulmonary disease) (HCC)    Coronary artery disease    a. 2012 s/p MI/PCI: s/p DES to mid/dist RCA and overlapping DES to LAD;  b. 07/2015 Cath: LAD 10% ISR, D1 40(jailed), LCX 59m, OM2 30, OM3 30, RCA 35m ISR, 10d ISR; c. 07/2016 NSTEMI/PCI: LAD 85ost/p/m (3.5x20 Synergy DES overlapping prior stent), D1 40, LCX 74m, OM2 95 (2.75x16 Synergy DES), OM3 30, RCA 73m, 10d.   Depression    Diabetic gastroparesis (HCC)    Diabetic polyneuropathy (HCC)    DKA (diabetic ketoacidoses) 03/14/2018   GERD (gastroesophageal reflux disease)    Gout    Heart failure (HCC)    Heart murmur    History of MI (myocardial infarction)    Hyperlipemia    Hypertension    Hypertensive heart disease    Hypokalemia    Ischemic cardiomyopathy    a. 07/2015 Echo: EF 45-50%, mod LVH, mid-apicalanteroseptal AK, Gr1 DD.   Joint pain    Kidney problem    Morbid obesity (HCC)    OSA on CPAP     Osteoarthritis    Osteomyelitis (HCC)    a. 07/2016 MTP joint of R great toe s/p transmetatarsal amputation.   Other fatigue    Polyneuropathy in diabetes(357.2)    Pulmonary sarcoidosis (HCC)    problems w/it years ago; no problems anymore (07/03/2014)   Rectal bleeding    Shortness of breath    Shortness of breath on exertion    Sleep apnea    Stomach ulcer    Swallowing difficulty    Type II diabetes mellitus (HCC)        Latest Ref Rng & Units 05/26/2024    6:47 AM 05/25/2024    8:26 PM 05/25/2024    1:54 PM  CBC  WBC 4.0 - 10.5 K/uL 8.4  10.9  8.7   Hemoglobin 13.0 - 17.0 g/dL 40.9  81.1  91.4   Hematocrit 39.0 - 52.0 % 40.3  41.5  41.1   Platelets 150 - 400 K/uL 156  205  176        Latest Ref Rng & Units 05/26/2024    6:47 AM 05/25/2024    8:26 PM 05/25/2024    1:54 PM  BMP  Glucose 70 - 99 mg/dL 782   956   BUN 8 - 23 mg/dL 71   73   Creatinine 2.13 - 1.24 mg/dL 0.86  5.57  5.71   Sodium 135 - 145 mmol/L 139   135   Potassium 3.5 - 5.1 mmol/L 3.1   3.1   Chloride 98 - 111 mmol/L 104   101   CO2 22 - 32 mmol/L 21   19   Calcium  8.9 - 10.3 mg/dL 8.9   8.5       Physical Exam: General: The patient is alert and oriented x3 in no acute distress.   Dermatology: Skin is warm, dry and supple bilateral lower extremities. Negative for open lesions or macerations.  Vascular: VAS US  ABI WITH/WO TBI 10/26/2023 ABI Findings:  +--------+------------------+-----+---------+--------+  Right  Rt Pressure (mmHg)IndexWaveform Comment   +--------+------------------+-----+---------+--------+  [id number]                                      +--------+------------------+-----+---------+--------+  ATA    255               1.45 triphasic          +--------+------------------+-----+---------+--------+  PTA    255               1.45 triphasic          +--------+------------------+-----+---------+--------+    +---------+------------------+-----+----------+-------+  Left    Lt Pressure (mmHg)IndexWaveform  Comment  +---------+------------------+-----+----------+-------+  Brachial 164                                       +---------+------------------+-----+----------+-------+  PTA     202               1.15 monophasic         +---------+------------------+-----+----------+-------+  DP      192               1.09 monophasic         +---------+------------------+-----+----------+-------+  Great Toe91                0.52                    +---------+------------------+-----+----------+-------+   +-------+-----------+-----------+------------+------------+  ABI/TBIToday's ABIToday's TBIPrevious ABIPrevious TBI  +-------+-----------+-----------+------------+------------+  Right [place]         amp        1.28        amp           +-------+-----------+-----------+------------+------------+  Left  1.15       0.52       1.15        0.7           +-------+-----------+-----------+------------+------------+  Arterial wall calcification precludes accurate ankle pressures and ABIs. Previous ABI pm 11/01/22.    Summary:  Right: Resting right ankle-brachial index indicates noncompressible right  lower extremity arteries.   Left: Resting left ankle-brachial index is within normal range. The left toe-brachial index is abnormal.   Neurological: Diminished via light touch  Musculoskeletal Exam: History of TMA RLE.  Ambulatory.  No crepitus with palpation throughout the foot and leg    ASSESSMENT/PLAN OF CARE 1.  Diabetic foot ulcer left 2.  History of Charcot neuroarthropathy left foot 3.  Concern for underlying osteomyelitis left foot 4.  S/P TMA RLE.  2017  -Patient seen at bedside this morning -Discussed with patient surgery which would include debridement of the ulcer as well  as bone biopsy and application of wound VAC.  After discussing the patient he  is amenable to this plan.  No guarantees were expressed or implied. -We also had a lengthy discussion today regarding the possibility of below-knee amputation.  The patient has had the chronic wound for over 1 year now intermittently.  Pending bone biopsy I do believe that below-knee amputation is a viable option for the patient.  For now we will proceed with salvage including debridement and bone biopsy -Preoperative orders placed.  Surgery tentatively scheduled for tomorrow, 05/27/2024 -will follow   Thank you for the consult.  Please contact me directly via secure chat with any questions or concerns.     Dot Gazella, DPM [facility]  Dr. Dot Gazella, DPM    [address] [place], [place] [address]                Office [phone]  Fax [phone]

## 2024-05-26 NOTE — Consult Note (Signed)
 [facility] Renal Consultation Note  Requesting MD: Arne Langdon, MD Indication for Consultation: AKI  Chief complaint: poorly healing foot ulcer  HPI:  Joshua [name] is a 68 y.o. male with a history of chronic combined systolic and diastolic heart failure, hypertension, diabetes mellitus, and CKD who presented with a poorly-healing left diabetic foot ulcer.  He saw podiatrist outpatient who directed him here.  Note that he has had multiple I&D's and oral abx with podiatry and has previously declined amputation.  MRI on 6/4 with osteo as concern for myofasciitis.  He was admitted to medicine.  Vascular is being consulted per podiatry recs to re-eval BKA of the LLE.  He was found to have acute kidney injury on chronic kidney disease.  Note that his baseline creatinine is low 2's per limited recent data.  Creatinine has been 5's here.  Nephrology is consulted for assistance with evaluation and management of AKI.  He states that he has had to push with urination recently and previously had a good stream.  PVR was ordered yesterday but not done.  He was started on vanc/zosyn .  He had lost his home after divorce and was homeless for several months then was admitted to the hospital and then a facility.  States takes lasix  40 mg BID.  No NSAID's. Follows with Dr. Christianne Cowper and recently saw Portia Brittle in our office in the past week.  States cardiology wants to do cath but they're nervous about his kidneys; he has stents.  He sees them again in two months.  Per [facility] data, note Cr 4.26 on 05/21/24 labs with our office.  Cr 2.57 on 01/19/24 with GFR 27.   Creatinine, Ser  Date/Time Value Ref Range Status  05/26/2024 06:47 AM 5.55 (H) 0.61 - 1.24 mg/dL Final  16/09/9603 54:09 PM 5.57 (H) 0.61 - 1.24 mg/dL Final  81/19/1478 29:56 PM 5.71 (H) 0.61 - 1.24 mg/dL Final  21/30/8657 84:69 AM 1.91 (H) 0.61 - 1.24 mg/dL Final  62/95/2841 32:44 AM 2.45 (H) 0.61 - 1.24 mg/dL Final  12/15/7251 66:44 PM 2.43 (H)  0.61 - 1.24 mg/dL Final  03/47/4259 56:38 AM 2.50 (H) 0.61 - 1.24 mg/dL Final  75/64/3329 51:88 AM 2.67 (H) 0.61 - 1.24 mg/dL Final  41/66/0630 16:01 PM 3.14 (H) 0.61 - 1.24 mg/dL Final  09/32/3557 32:20 AM 2.04 (H) 0.61 - 1.24 mg/dL Final  25/42/7062 37:62 PM 1.47 (H) 0.61 - 1.24 mg/dL Final  83/15/1761 60:73 AM 1.37 (H) 0.61 - 1.24 mg/dL Final  71/05/2693 85:46 AM 1.82 (H) 0.61 - 1.24 mg/dL Final  27/02/5008 38:18 AM 1.66 (H) 0.61 - 1.24 mg/dL Final  29/93/7169 67:89 AM 1.80 (H) 0.61 - 1.24 mg/dL Final  38/09/1750 02:58 AM 1.97 (H) 0.61 - 1.24 mg/dL Final  52/77/8242 35:36 AM 2.06 (H) 0.61 - 1.24 mg/dL Final  14/43/1540 08:67 AM 2.48 (H) 0.61 - 1.24 mg/dL Final  61/95/0932 67:12 PM 2.30 (H) 0.61 - 1.24 mg/dL Final  45/80/9983 38:25 AM 2.01 (H) 0.61 - 1.24 mg/dL Final  05/39/7673 41:93 AM 2.68 (H) 0.61 - 1.24 mg/dL Final  79/01/4096 35:32 AM 2.14 (H) 0.61 - 1.24 mg/dL Final  99/24/2683 41:96 AM 2.21 (H) 0.61 - 1.24 mg/dL Final  22/29/7989 21:19 PM 2.44 (H) 0.61 - 1.24 mg/dL Final  41/74/0814 48:18 AM 2.55 (H) 0.76 - 1.27 mg/dL Final  56/31/4970 26:37 AM 2.44 (H) 0.76 - 1.27 mg/dL Final  85/88/5027 74:12 AM 3.24 (H) 0.61 - 1.24 mg/dL Final  87/86/7672 09:47 AM 3.85 (H)  0.61 - 1.24 mg/dL Final  16/09/9603 54:09 AM 4.98 (H) 0.61 - 1.24 mg/dL Final  81/19/1478 29:56 AM 5.57 (H) 0.61 - 1.24 mg/dL Final  21/30/8657 84:69 PM 5.73 (H) 0.61 - 1.24 mg/dL Final  62/95/2841 32:44 PM 5.55 (H) 0.61 - 1.24 mg/dL Final  12/15/7251 66:44 AM 2.78 (H) 0.61 - 1.24 mg/dL Final  03/47/4259 56:38 AM 3.01 (H) 0.61 - 1.24 mg/dL Final  75/64/3329 51:88 AM 3.10 (H) 0.61 - 1.24 mg/dL Final  41/66/0630 16:01 AM 3.30 (H) 0.61 - 1.24 mg/dL Final  09/32/3557 32:20 AM 2.65 (H) 0.61 - 1.24 mg/dL Final  25/42/7062 37:62 AM 2.68 (H) 0.61 - 1.24 mg/dL Final  83/15/1761 60:73 AM 2.80 (H) 0.61 - 1.24 mg/dL Final  71/05/2693 85:46 AM 3.03 (H) 0.61 - 1.24 mg/dL Final  27/02/5008 38:18 AM 2.01 (H) 0.61 - 1.24 mg/dL Final   29/93/7169 67:89 AM 1.94 (H) 0.61 - 1.24 mg/dL Final  38/09/1750 02:58 AM 1.96 (H) 0.61 - 1.24 mg/dL Final  52/77/8242 35:36 PM 2.01 (H) 0.61 - 1.24 mg/dL Final  14/43/1540 08:67 PM 2.11 (H) 0.76 - 1.27 mg/dL Final  61/95/0932 67:12 PM 2.13 (H) 0.76 - 1.27 mg/dL Final  45/80/9983 38:25 AM 1.62 (H) 0.61 - 1.24 mg/dL Final  05/39/7673 41:93 AM 1.61 (H) 0.61 - 1.24 mg/dL Final  79/01/4096 35:32 AM 1.75 (H) 0.61 - 1.24 mg/dL Final  99/24/2683 41:96 AM 1.87 (H) 0.61 - 1.24 mg/dL Final  22/29/7989 21:19 AM 1.92 (H) 0.61 - 1.24 mg/dL Final  41/74/0814 48:18 PM 1.93 (H) 0.61 - 1.24 mg/dL Final     PMHx:   Past Medical History:  Diagnosis Date   Acute coronary syndrome (HCC)    Anemia    B12 deficiency    Chest pain    CHF (congestive heart failure) (HCC)    Chronic combined systolic and diastolic CHF (congestive heart failure) (HCC)    a. 07/2015 Echo: EF 45-50%, mod LVH, mid apical/antsept AK, Gr 1 DD.   Chronic low back pain    CKD (chronic kidney disease), stage III (HCC)    Constipation    COPD (chronic obstructive pulmonary disease) (HCC)    Coronary artery disease    a. 2012 s/p MI/PCI: s/p DES to mid/dist RCA and overlapping DES to LAD;  b. 07/2015 Cath: LAD 10% ISR, D1 40(jailed), LCX 40m, OM2 30, OM3 30, RCA 41m ISR, 10d ISR; c. 07/2016 NSTEMI/PCI: LAD 85ost/p/m (3.5x20 Synergy DES overlapping prior stent), D1 40, LCX 3m, OM2 95 (2.75x16 Synergy DES), OM3 30, RCA 67m, 10d.   Depression    Diabetic gastroparesis (HCC)    Diabetic polyneuropathy (HCC)    DKA (diabetic ketoacidoses) 03/14/2018   GERD (gastroesophageal reflux disease)    Gout    Heart failure (HCC)    Heart murmur    History of MI (myocardial infarction)    Hyperlipemia    Hypertension    Hypertensive heart disease    Hypokalemia    Ischemic cardiomyopathy    a. 07/2015 Echo: EF 45-50%, mod LVH, mid-apicalanteroseptal AK, Gr1 DD.   Joint pain    Kidney problem    Morbid obesity (HCC)    OSA on CPAP     Osteoarthritis    Osteomyelitis (HCC)    a. 07/2016 MTP joint of R great toe s/p transmetatarsal amputation.   Other fatigue    Polyneuropathy in diabetes(357.2)    Pulmonary sarcoidosis (HCC)    problems w/it years ago; no problems anymore (07/03/2014)   Rectal  bleeding    Shortness of breath    Shortness of breath on exertion    Sleep apnea    Stomach ulcer    Swallowing difficulty    Type II diabetes mellitus (HCC)     Past Surgical History:  Procedure Laterality Date   ABDOMINAL AORTOGRAM W/LOWER EXTREMITY N/A 11/29/2022   Procedure: ABDOMINAL AORTOGRAM W/LOWER EXTREMITY;  Surgeon: Adine Hoof, MD;  Location: [facility] INVASIVE CV LAB;  Service: Cardiovascular;  Laterality: N/A;   AMPUTATION Right 07/24/2016   Procedure: TRANSMETATARSAL FOOT AMPUTATION;  Surgeon: Timothy Ford, MD;  Location: [facility] OR;  Service: Orthopedics;  Laterality: Right;   APPLICATION OF WOUND VAC Left 11/30/2022   Procedure: APPLICATION OF WOUND VAC;  Surgeon: Floyce Hutching, DPM;  Location: [facility] OR;  Service: Podiatry;  Laterality: Left;   BALLOON DILATION N/A 03/21/2018   Procedure: BALLOON DILATION;  Surgeon: Lanita Pitman, MD;  Location: [facility] ENDOSCOPY;  Service: Endoscopy;  Laterality: N/A;   BIOPSY  03/16/2022   Procedure: BIOPSY;  Surgeon: Baldo Bonds, MD;  Location: [facility] ENDOSCOPY;  Service: Endoscopy;;   BREAST LUMPECTOMY Right ~ 2000   benign   CARDIAC CATHETERIZATION N/A 07/30/2015   Procedure: Left Heart Cath and Coronary Angiography;  Surgeon: Odie Benne, MD;  Location: [facility] INVASIVE CV LAB;  Service: Cardiovascular;  Laterality: N/A;   CARDIAC CATHETERIZATION N/A 07/19/2016   Procedure: Left Heart Cath and Coronary Angiography;  Surgeon: Arty Binning, MD;  Location: [facility] INVASIVE CV LAB;  Service: Cardiovascular;  Laterality: N/A;   CARDIAC CATHETERIZATION N/A 07/29/2016   Procedure: Coronary Stent Intervention;  Surgeon: Arty Binning, MD;  Location: [facility] INVASIVE CV LAB;   Service: Cardiovascular;  Laterality: N/A;   CARDIAC CATHETERIZATION N/A 07/29/2016   Procedure: Intravascular Ultrasound/IVUS;  Surgeon: Arty Binning, MD;  Location: [facility] INVASIVE CV LAB;  Service: Cardiovascular;  Laterality: N/A;   CATARACT EXTRACTION W/ INTRAOCULAR LENS  IMPLANT, BILATERAL Bilateral 2014-2015   COLONOSCOPY WITH PROPOFOL  N/A 08/22/2017   Procedure: COLONOSCOPY WITH PROPOFOL ;  Surgeon: Baldo Bonds, MD;  Location: [facility] ENDOSCOPY;  Service: Endoscopy;  Laterality: N/A;   COLONOSCOPY WITH PROPOFOL  N/A 03/16/2022   Procedure: COLONOSCOPY WITH PROPOFOL ;  Surgeon: Baldo Bonds, MD;  Location: [facility] ENDOSCOPY;  Service: Endoscopy;  Laterality: N/A;   CORONARY ANGIOPLASTY WITH STENT PLACEMENT  10/31/2011   30 % MID ATRIOVENTRICULAR GROOVE CX STENOSIS. 90 % MID RCA.PTA TO LAD/STENTING OF TANDEM 60-70%. LAD STENOSIS 99% REDUCED TO 0%.3.0 X PROMUS DES POSTDILATED TO 3.25MM.   CORONARY ANGIOPLASTY WITH STENT PLACEMENT  07/03/2014   2   CORONARY STENT INTERVENTION N/A 04/09/2019   Procedure: CORONARY STENT INTERVENTION;  Surgeon: Lucendia Rusk, MD;  Location: [facility] INVASIVE CV LAB;  Service: Cardiovascular;  Laterality: N/A;   CORONARY STENT INTERVENTION N/A 08/22/2019   Procedure: CORONARY STENT INTERVENTION;  Surgeon: Swaziland, Peter M, MD;  Location: [facility] INVASIVE CV LAB;  Service: Cardiovascular;  Laterality: N/A;   ESOPHAGOGASTRODUODENOSCOPY (EGD) WITH PROPOFOL  N/A 03/21/2018   Procedure: ESOPHAGOGASTRODUODENOSCOPY (EGD) WITH PROPOFOL ;  Surgeon: Lanita Pitman, MD;  Location: [facility] ENDOSCOPY;  Service: Endoscopy;  Laterality: N/A;   ESOPHAGOGASTRODUODENOSCOPY (EGD) WITH PROPOFOL  N/A 03/16/2022   Procedure: ESOPHAGOGASTRODUODENOSCOPY (EGD) WITH PROPOFOL ;  Surgeon: Baldo Bonds, MD;  Location: [facility] ENDOSCOPY;  Service: Endoscopy;  Laterality: N/A;   GRAFT APPLICATION Left 11/30/2022   Procedure: GRAFT APPLICATION;  Surgeon: Floyce Hutching, DPM;  Location: [facility] OR;  Service: Podiatry;   Laterality: Left;   I & D EXTREMITY Right  10/30/2013   Procedure: IRRIGATION AND DEBRIDEMENT RIGHT SMALL FINGER POSSIBLE SECONDARY WOUND CLOSURE;  Surgeon: Hedy Living, MD;  Location: [facility] ORS;  Service: Orthopedics;  Laterality: Right;  Donzella Galley is available after 4pm   I & D EXTREMITY Right 11/01/2013   Procedure:  IRRIGATION AND DEBRIDEMENT RIGHT  PROXIMAL PHALANGEAL LEVEL AMPUTATION;  Surgeon: Hedy Living, MD;  Location: [facility] ORS;  Service: Orthopedics;  Laterality: Right;   INCISION AND DRAINAGE Right 10/28/2013   Procedure: INCISION AND DRAINAGE Right small finger;  Surgeon: Hedy Living, MD;  Location: [facility] ORS;  Service: Orthopedics;  Laterality: Right;   INCISION AND DRAINAGE OF WOUND Left 11/07/2022   Procedure: IRRIGATION AND DEBRIDEMENT WOUND;  Surgeon: Evertt Hoe, DPM;  Location: [facility] OR;  Service: Podiatry;  Laterality: Left;  Left foot ucler debridement and bone biopsy   LEFT HEART CATH Left 11/03/2011   Procedure: LEFT HEART CATH;  Surgeon: Millicent Ally, MD;  Location: [facility] CATH LAB;  Service: Cardiovascular;  Laterality: Left;   LEFT HEART CATH AND CORONARY ANGIOGRAPHY N/A 04/09/2019   Procedure: LEFT HEART CATH AND CORONARY ANGIOGRAPHY;  Surgeon: Lucendia Rusk, MD;  Location: [facility] INVASIVE CV LAB;  Service: Cardiovascular;  Laterality: N/A;   LEFT HEART CATHETERIZATION WITH CORONARY ANGIOGRAM N/A 07/03/2014   Procedure: LEFT HEART CATHETERIZATION WITH CORONARY ANGIOGRAM;  Surgeon: Mickiel Albany, MD;  Location: [facility] CATH LAB;  Service: Cardiovascular;  Laterality: N/A;   PERCUTANEOUS CORONARY STENT INTERVENTION (PCI-S) Left 11/03/2011   Procedure: PERCUTANEOUS CORONARY STENT INTERVENTION (PCI-S);  Surgeon: Millicent Ally, MD;  Location: [facility] CATH LAB;  Service: Cardiovascular;  Laterality: Left;   RIGHT/LEFT HEART CATH AND CORONARY ANGIOGRAPHY N/A 08/22/2019   Procedure: RIGHT/LEFT HEART CATH AND CORONARY ANGIOGRAPHY;  Surgeon: Swaziland, Peter M, MD;   Location: [facility] INVASIVE CV LAB;  Service: Cardiovascular;  Laterality: N/A;   TOE AMPUTATION Right ~ 2010   2nd toe   TOE AMPUTATION Right 2012   3rd toe   WOUND DEBRIDEMENT Left 11/30/2022   Procedure: DEBRIDEMENT WOUND;  Surgeon: Floyce Hutching, DPM;  Location: [facility] OR;  Service: Podiatry;  Laterality: Left;    Family Hx:  Family History  Problem Relation Age of Onset   Sudden death Mother    Heart attack Maternal Aunt    Diabetes Maternal Grandmother    Hypertension Maternal Grandmother    Sudden death Other    Neuropathy Neg Hx     Social History:  reports that he has never smoked. He has never used smokeless tobacco. He reports current alcohol use. He reports that he does not use drugs.  Allergies:  Allergies  Allergen Reactions   Amlodipine  Besylate     Other Reaction(s): leg cramps   Chlorthalidone Other (See Comments)    Causes dehydration, kidneys shut down  Other Reaction(s): Kidney Failure    Medications: Prior to Admission medications   Medication Sig Start Date End Date Taking? Authorizing Provider  acetaminophen  (TYLENOL ) 325 MG tablet Take 2 tablets (650 mg total) by mouth every 6 (six) hours as needed for mild pain (or Fever >/= 101). 07/13/23  Yes Jackolyn Masker, MD  allopurinol  (ZYLOPRIM ) 100 MG tablet Take 1 tablet (100 mg total) by mouth daily. Patient taking differently: Take 100 mg by mouth in the morning. 05/24/24  Yes   aspirin  EC 81 MG tablet Take 1 tablet (81 mg total) by mouth daily. Patient taking differently: Take 81 mg by mouth in the morning. 07/13/23  Yes  Jackolyn Masker, MD  cyclobenzaprine  (FLEXERIL ) 10 MG tablet Take 1 tablet (10 mg total) by mouth 3 (three) times daily as needed. 03/29/24  Yes Farris Hong, PA-C  DULoxetine  (CYMBALTA ) 20 MG capsule Take 1 capsule (20 mg total) by mouth 2 (two) times daily. 05/24/24  Yes   famotidine  (PEPCID ) 20 MG tablet Take 1 tablet (20 mg total) by mouth at bedtime. 07/13/23 05/25/24 Yes Jackolyn Masker, MD   furosemide  (LASIX ) 40 MG tablet Take 1 tablet (40 mg total) by mouth 2 (two) times daily. 05/24/24  Yes   gabapentin  (NEURONTIN ) 100 MG capsule Take 1 capsule (100 mg total) by mouth at bedtime. 05/24/24  Yes   insulin  glargine (LANTUS  SOLOSTAR) 100 UNIT/ML Solostar Pen Inject 25 Units into the skin every morning. 04/10/24  Yes Emilie Harden, MD  insulin  lispro (HUMALOG  KWIKPEN) 100 UNIT/ML KwikPen Inject 5-14 Units into the skin 3 (three) times daily with meals. Inject 5 units with meals, add the following per glucose level. CBG 70 - 120: 0 units CBG 121 - 150: 1 unit CBG 151 - 200: 2 units CBG 201 - 250: 3 units CBG 251 - 300: 5 units CBG 301 - 350: 7 units CBG 351 - 400: 9 units MDD: 50 units 02/23/24  Yes Emilie Harden, MD  isosorbide  mononitrate (IMDUR ) 60 MG 24 hr tablet Take 2 tablets by mouth in the morning AND 1 tablet every evening. Patient taking differently: Take 2 tablets by mouth in the morning AND then take 1 tablet by mouth every evening. 10/15/23  Yes Jackolyn Masker, MD  latanoprost  (XALATAN ) 0.005 % ophthalmic solution Place 1 drop into both eyes at bedtime. 10/15/23  Yes Jackolyn Masker, MD  losartan  (COZAAR ) 25 MG tablet Take 1 tablet (25 mg total) by mouth daily. Patient taking differently: Take 25 mg by mouth in the morning. 05/24/24  Yes   metoprolol  succinate (TOPROL -XL) 100 MG 24 hr tablet Take 1 tablet (100 mg total) by mouth daily. Patient taking differently: Take 100 mg by mouth in the morning. 05/24/24  Yes   nitroGLYCERIN  (NITROSTAT ) 0.4 MG SL tablet Place 1 tablet (0.4 mg total) under the tongue every 5 (five) minutes as needed for chest pain. 07/13/23  Yes Jackolyn Masker, MD  pantoprazole  (PROTONIX ) 40 MG tablet Take 1 tablet (40 mg total) by mouth 2 (two) times daily. 07/13/23 05/25/24 Yes Jackolyn Masker, MD  rosuvastatin  (CRESTOR ) 40 MG tablet Take 1 tablet (40 mg total) by mouth daily. Patient taking differently: Take 40 mg by mouth in the morning. 02/20/24 05/25/24 Yes Millicent Ally, MD  Semaglutide , 1 MG/DOSE, (OZEMPIC , 1 MG/DOSE,) 4 MG/3ML SOPN Inject 1 mg into the skin once a week. Patient taking differently: Inject 1 mg into the skin once a week. On Thursday 08/31/23  Yes Emilie Harden, MD  SILVER  SULFADIAZINE  EX Apply 1 Application topically 3 (three) times a week. No set days for the patient.   Yes [provider]  ticagrelor  (BRILINTA ) 90 MG TABS tablet Take 1 tablet (90 mg total) by mouth 2 (two) times daily. 10/15/23  Yes Jackolyn Masker, MD  Blood Glucose Monitoring Suppl (BLOOD GLUCOSE MONITOR SYSTEM) w/Device KIT Use in the morning, at noon, and at bedtime. 07/27/23   Ronni Colace, DO  Continuous Glucose Receiver (FREESTYLE LIBRE 3 READER) DEVI Use as advised 08/31/23   Emilie Harden, MD  Continuous Glucose Sensor (FREESTYLE LIBRE 3 PLUS SENSOR) MISC 1 each by Does not apply route every 14 (fourteen) days. 08/31/23  Emilie Harden, MD  Insulin  Pen Needle 32G X 4 MM MISC Use with Lantus  07/13/23      I have reviewed the patient's current and reported prior to admission medications.  Labs:     Latest Ref Rng & Units 05/26/2024    6:47 AM 05/25/2024    8:26 PM 05/25/2024    1:54 PM  BMP  Glucose 70 - 99 mg/dL 161   096   BUN 8 - 23 mg/dL 71   73   Creatinine 0.45 - 1.24 mg/dL 4.09  8.11  9.14   Sodium 135 - 145 mmol/L 139   135   Potassium 3.5 - 5.1 mmol/L 3.1   3.1   Chloride 98 - 111 mmol/L 104   101   CO2 22 - 32 mmol/L 21   19   Calcium  8.9 - 10.3 mg/dL 8.9   8.5     Urinalysis    Component Value Date/Time   COLORURINE STRAW (A) 05/26/2024 1359   APPEARANCEUR CLEAR 05/26/2024 1359   LABSPEC 1.008 05/26/2024 1359   PHURINE 5.0 05/26/2024 1359   GLUCOSEU NEGATIVE 05/26/2024 1359   HGBUR MODERATE (A) 05/26/2024 1359   BILIRUBINUR NEGATIVE 05/26/2024 1359   KETONESUR NEGATIVE 05/26/2024 1359   PROTEINUR 100 (A) 05/26/2024 1359   UROBILINOGEN 0.2 11/01/2011 1410   NITRITE NEGATIVE 05/26/2024 1359   LEUKOCYTESUR NEGATIVE  05/26/2024 1359     ROS:  Pertinent items noted in HPI and remainder of comprehensive ROS otherwise negative.  Physical Exam: Vitals:   05/26/24 0805 05/26/24 1430  BP: 120/70 116/71  Pulse: 84 75  Resp: 16 16  Temp: 97.6 F (36.4 C) 97.9 F (36.6 C)  SpO2: 100% 100%     General adult male in bed in no acute distress HEENT normocephalic atraumatic extraocular movements intact sclera anicteric Neck supple trachea midline Lungs clear to auscultation bilaterally normal work of breathing at rest  Heart S1S2 no rub Abdomen soft nontender nondistended Extremities no edema; left foot wrapped Psych normal mood and affect Neuro alert and oriented x 3 provides hx and follows commands GU no foley    Assessment/Plan:  # AKI  - Pre-renal insults in the setting of infection - hold lasix   - treat osteo as per primary   - caution with vanc - see pharmacy is consulted for dosing  # CKD stage IV  - Baseline Cr mid to low 2's based on limited recent data - Follows with Dr. Christianne Cowper at [facility]   # Diabetic foot ulcer with osteomyelitis - abx per primary team  - see podiatry recommends vascular consult   # HTN  - hx of such  - avoid hypotension  # Hypokalemia - was repleted earlier  # Urinary hesitancy  - Start flomax  - Check post-void residual and in/out cath if needed  Thank you for the consult.    Nan Aver 05/26/2024, 6:02 PM

## 2024-05-26 NOTE — Progress Notes (Addendum)
 Progress Note   Patient: Joshua [name] WGN:[id number] DOB: 09-28-1956 DOA: 05/25/2024     1 DOS: the patient was seen and examined on 05/26/2024   Brief hospital course: Per H&P HPI   HPI: BRETTON TANDY is a 68 y.o. male with medical history significant of CAD s/p multiple PCIs, HFpEF, HTN, DM2 c/b neuropathy, s/p R TMA in 2017, chronic poorly healing LLE heel wound, CKD4, OSA, and obesity p/w chronic poorly healing diabetic LLE foot ulcer c/f osteomyelitis.   Pt states that he has been battling this LLE wound for the past two years. Since that time he has multiple I&Ds by podiatry followed by rounds of PO abx and wound care to no avail. Podiatry recommended proximal limb amputation in 11/2022 per chart review, and pt was evaluated by vascular surgery at that time and offered L BKA as well, but deferred. Pt presents today for IP eval after visiting his OP podiatrist today.   In the ED, pt AFVSS. Labs notable for K 3.1, BUN/Cr 73/5.71, lactate 1.4, ESR 46, and glucose 210. MRI on 6/4 showed c/f LLE osteomyelitis of cuboid, as well as fatty atrophy of the foot musculature and moderate muscle edema suggesting myofasciitis. Pt admitted to medicine with podiatry/vascular following for ongoing care.  Assessment and Plan: LLE diabetic foot ulcer Likely osteomyelitis  Continue IV antibiotics ( vanc zosyn , pharmacy consulted for dosing). Podiatry consulted and plan for washout.  F/u ABI Will need to consult vascular surgery in the AM for consideration of chronicity of wound and minimal improvement. Previously had discussions with [facility] regarding amputation  AKI on CKD 4    Latest Ref Rng & Units 05/26/2024    6:47 AM 05/25/2024    8:26 PM 05/25/2024    1:54 PM  BMP  Glucose 70 - 99 mg/dL 784   696   BUN 8 - 23 mg/dL 71   73   Creatinine 2.95 - 1.24 mg/dL 2.84  1.32  4.40   Sodium 135 - 145 mmol/L 139   135   Potassium 3.5 - 5.1 mmol/L 3.1   3.1   Chloride 98 - 111 mmol/L 104   101   CO2  22 - 32 mmol/L 21   19   Calcium  8.9 - 10.3 mg/dL 8.9   8.5    Previous scr 1.9-2.4. Likely prerenal Nephrology consulted.  Continue to monitor with daily BMP   Hypokalemia  Supplement   CAD s/p multiple PCIs PAD  S/p R TMA  Continue statin  Last saw cardiology 05/23/2024, and they recommended lifelong DAPT. On ticagrelor , hold this pending OR ASA only for now, resume brillinta pending VVS OR plan Cardiology was also considering Cath prior to OR at taht time.   Combined systolic and diastolic CHF  not in acute exaerbation PET/CT showed reduced EF down to 35-37%.  Echo 11/19/2022 showed an EF of 55-60%, normal diastolic parameters.  Echocardiogram 08/19/2021 showed normal LVEF and G2 DD.    HTN Continue imdur  and metoprolol  Hold losartan  and lasix  due to AKI.    T2DM  A1c 7, 2 mo ago.  Continue semeglee 20u ngihtly +SSI   OSA  CPAP      CBG Orders  (From admission, onward)           Start     Ordered   05/25/24 2200  CBG monitoring  (Moderate (average weight, post-op))  4 times daily, before meals & at bedtime      05/25/24 1755  Subjective: Feels foot pain is tolerable with medicines.   Physical Exam: Vitals:   05/25/24 1920 05/26/24 0413 05/26/24 0423 05/26/24 0805  BP: 115/62 (!) 78/53 96/63 120/70  Pulse: 91 85 81 84  Resp: 18 16  16   Temp: 98.1 F (36.7 C) 98.2 F (36.8 C)  97.6 F (36.4 C)  TempSrc:      SpO2: 100% 99%  100%  Weight:      Height:       Physical Exam  Constitutional: In no distress.  Cardiovascular: Normal rate, regular rhythm. No lower extremity edema  Pulmonary: Non labored breathing on room air, no wheezing or rales.  No  Abdominal: Soft.  Non distended and non tender Musculoskeletal: Normal range of motion.  S/p R TMA. L foot with wound on lateral aspect.    Media Information     Neurological: Alert and oriented to person, place, and time. Non focal  Skin: Skin is warm and dry.   Data  Reviewed:  No results found.    Latest Ref Rng & Units 05/26/2024    6:47 AM 05/25/2024    8:26 PM 05/25/2024    1:54 PM  CBC  WBC 4.0 - 10.5 K/uL 8.4  10.9  8.7   Hemoglobin 13.0 - 17.0 g/dL 16.1  09.6  04.5   Hematocrit 39.0 - 52.0 % 40.3  41.5  41.1   Platelets 150 - 400 K/uL 156  205  176       Latest Ref Rng & Units 05/26/2024    6:47 AM 05/25/2024    8:26 PM 05/25/2024    1:54 PM  BMP  Glucose 70 - 99 mg/dL 409   811   BUN 8 - 23 mg/dL 71   73   Creatinine 9.14 - 1.24 mg/dL 7.82  9.56  2.13   Sodium 135 - 145 mmol/L 139   135   Potassium 3.5 - 5.1 mmol/L 3.1   3.1   Chloride 98 - 111 mmol/L 104   101   CO2 22 - 32 mmol/L 21   19   Calcium  8.9 - 10.3 mg/dL 8.9   8.5      Family Communication: None at bedside  Disposition: Status is: Inpatient Remains inpatient appropriate because: diabetic ulcer, requiring surgery   Planned Discharge Destination: Pending clinical course     Time spent: 35 minutes  Author: Joette Mustard, MD 05/26/2024 10:06 AM  For on call review [email or web address].

## 2024-05-27 ENCOUNTER — Encounter (HOSPITAL_COMMUNITY)
Admission: EM | Disposition: A | Discharge status: Skilled Nursing Facility | Payer: Self-pay | Source: Ambulatory Visit | Attending: Internal Medicine

## 2024-05-27 ENCOUNTER — Inpatient Hospital Stay (HOSPITAL_COMMUNITY)

## 2024-05-27 DIAGNOSIS — I70244 Atherosclerosis of native arteries of left leg with ulceration of heel and midfoot: Secondary | ICD-10-CM

## 2024-05-27 DIAGNOSIS — N289 Disorder of kidney and ureter, unspecified: Secondary | ICD-10-CM

## 2024-05-27 DIAGNOSIS — I739 Peripheral vascular disease, unspecified: Secondary | ICD-10-CM

## 2024-05-27 DIAGNOSIS — N184 Chronic kidney disease, stage 4 (severe): Secondary | ICD-10-CM

## 2024-05-27 DIAGNOSIS — E1159 Type 2 diabetes mellitus with other circulatory complications: Secondary | ICD-10-CM

## 2024-05-27 DIAGNOSIS — L97529 Non-pressure chronic ulcer of other part of left foot with unspecified severity: Secondary | ICD-10-CM

## 2024-05-27 DIAGNOSIS — E11628 Type 2 diabetes mellitus with other skin complications: Secondary | ICD-10-CM | POA: Diagnosis not present

## 2024-05-27 DIAGNOSIS — E1129 Type 2 diabetes mellitus with other diabetic kidney complication: Secondary | ICD-10-CM

## 2024-05-27 DIAGNOSIS — E119 Type 2 diabetes mellitus without complications: Secondary | ICD-10-CM

## 2024-05-27 DIAGNOSIS — M86072 Acute hematogenous osteomyelitis, left ankle and foot: Secondary | ICD-10-CM

## 2024-05-27 DIAGNOSIS — I2510 Atherosclerotic heart disease of native coronary artery without angina pectoris: Secondary | ICD-10-CM

## 2024-05-27 DIAGNOSIS — I509 Heart failure, unspecified: Secondary | ICD-10-CM

## 2024-05-27 DIAGNOSIS — N179 Acute kidney failure, unspecified: Secondary | ICD-10-CM | POA: Diagnosis not present

## 2024-05-27 LAB — GLUCOSE, CAPILLARY
Glucose-Capillary: 139 mg/dL — ABNORMAL HIGH (ref 70–99)
Glucose-Capillary: 232 mg/dL — ABNORMAL HIGH (ref 70–99)
Glucose-Capillary: 171 mg/dL — ABNORMAL HIGH (ref 70–99)
Glucose-Capillary: 148 mg/dL — ABNORMAL HIGH (ref 70–99)

## 2024-05-27 LAB — BASIC METABOLIC PANEL WITH GFR
Anion gap: 15 (ref 5–15)
BUN: 70 mg/dL — ABNORMAL HIGH (ref 8–23)
CO2: 18 mmol/L — ABNORMAL LOW (ref 22–32)
Calcium: 9 mg/dL (ref 8.9–10.3)
Chloride: 105 mmol/L (ref 98–111)
Creatinine, Ser: 5.26 mg/dL — ABNORMAL HIGH (ref 0.61–1.24)
GFR, Estimated: 11 mL/min — ABNORMAL LOW (ref >60)
Glucose, Bld: 143 mg/dL — ABNORMAL HIGH (ref 70–99)
Potassium: 3.7 mmol/L (ref 3.5–5.1)
Sodium: 138 mmol/L (ref 135–145)

## 2024-05-27 LAB — CBC
HCT: 41.9 % (ref 39.0–52.0)
Hemoglobin: 13.3 g/dL (ref 13.0–17.0)
MCH: 29.1 pg (ref 26.0–34.0)
MCHC: 31.7 g/dL (ref 30.0–36.0)
MCV: 91.7 fL (ref 80.0–100.0)
Platelets: 129 10*3/uL — ABNORMAL LOW (ref 150–400)
RBC: 4.57 MIL/uL (ref 4.22–5.81)
RDW: 15.2 % (ref 11.5–15.5)
WBC: 7.5 10*3/uL (ref 4.0–10.5)
nRBC: 0 % (ref 0.0–0.2)

## 2024-05-27 LAB — VAS US ABI WITH/WO TBI
Left ABI: 1.11
Right ABI: 1.16

## 2024-05-27 LAB — VANCOMYCIN, RANDOM: Vancomycin Rm: 15 ug/mL

## 2024-05-27 SURGERY — IRRIGATION AND DEBRIDEMENT FOOT
Anesthesia: General | Laterality: Left

## 2024-05-27 MED ORDER — LINEZOLID 600 MG/300ML IV SOLN
600.0000 mg | Freq: Two times a day (BID) | INTRAVENOUS | Status: DC
  Administered 2024-05-27 (×2): 600 mg via INTRAVENOUS
  Filled 2024-05-27 (×5): qty 300

## 2024-05-27 MED ORDER — SODIUM BICARBONATE 650 MG PO TABS
650.0000 mg | ORAL_TABLET | Freq: Two times a day (BID) | ORAL | Status: DC
  Administered 2024-05-27 – 2024-05-29 (×3): 650 mg via ORAL
  Filled 2024-05-27 (×4): qty 1
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
## 2024-05-27 NOTE — Progress Notes (Signed)
 [facility] Progress Note  Name: Joshua [name] MRN: [id number] DOB: 1956/10/07  Chief Complaint:  Directed here by podiatry re: foot wound   Subjective:  He had 3.8 liters UOP over 6/14 charted.  He feels ok today.  He had elevated PVR but didn't meet criteria for cath  Review of systems:  Some shortness of breath - not much; doesn't feel swollen Denies chest pain  Denies n/v --------------- Background on consult:  Joshua [name] is a 68 y.o. male with a history of chronic combined systolic and diastolic heart failure, hypertension, diabetes mellitus, and CKD who presented with a poorly-healing left diabetic foot ulcer.  He saw podiatrist outpatient who directed him here.  Note that he has had multiple I&D's and oral abx with podiatry and has previously declined amputation.  MRI on 6/4 with osteo as concern for myofasciitis.  He was admitted to medicine.  Vascular is being consulted per podiatry recs to re-eval BKA of the LLE.  He was found to have acute kidney injury on chronic kidney disease.  Note that his baseline creatinine is low 2's per limited recent data.  Creatinine has been 5's here.  Nephrology is consulted for assistance with evaluation and management of AKI.  He states that he has had to push with urination recently and previously had a good stream.  PVR was ordered yesterday but not done.  He was started on vanc/zosyn .  He had lost his home after divorce and was homeless for several months then was admitted to the hospital and then a facility.  States takes lasix  40 mg BID.  No NSAID's. Follows with Dr. Christianne Cowper and recently saw Portia Brittle in our office in the past week.  States cardiology wants to do cath but they're nervous about his kidneys; he has stents.  He sees them again in two months.  Per [facility] data, note Cr 4.26 on 05/21/24 labs with our office.  Cr 2.57 on 01/19/24 with GFR 27.     Intake/Output Summary (Last 24 hours) at 05/27/2024 1124 Last data filed at  05/27/2024 0700 Gross per 24 hour  Intake 100 ml  Output 2325 ml  Net -2225 ml    Vitals:  Vitals:   05/26/24 1430 05/26/24 2001 05/27/24 0518 05/27/24 0809  BP: 116/71 129/80 (!) 92/55 109/69  Pulse: 75 69 69 74  Resp: 16 18 16 18   Temp: 97.9 F (36.6 C) 97.7 F (36.5 C) 97.9 F (36.6 C) 97.9 F (36.6 C)  TempSrc:    Oral  SpO2: 100% 100% 100% 100%  Weight:      Height:         Physical Exam:  General adult male in bed in no acute distress HEENT normocephalic atraumatic extraocular movements intact sclera anicteric Neck supple trachea midline Lungs clear to auscultation bilaterally normal work of breathing at rest  Heart S1S2 no rub Abdomen soft nontender nondistended Extremities no edema; left foot wrapped Psych normal mood and affect Neuro alert and oriented x 3 provides hx and follows commands GU no foley   Medications reviewed   Labs:     Latest Ref Rng & Units 05/27/2024    8:58 AM 05/26/2024    6:47 AM 05/25/2024    8:26 PM  BMP  Glucose 70 - 99 mg/dL 409  811    BUN 8 - 23 mg/dL 70  71    Creatinine 9.14 - 1.24 mg/dL 7.82  9.56  2.13   Sodium 135 - 145 mmol/L  138  139    Potassium 3.5 - 5.1 mmol/L 3.7  3.1    Chloride 98 - 111 mmol/L 105  104    CO2 22 - 32 mmol/L 18  21    Calcium  8.9 - 10.3 mg/dL 9.0  8.9       Assessment/Plan:   # AKI  - Pre-renal insults in the setting of infection/osteo - hold home lasix  again today - Defer fluids today - treat osteo as per primary - caution with vanc - see pharmacy is consulted for dosing   # CKD stage IV  - Baseline Cr mid to low 2's based on limited recent data - Follows with Dr. Christianne Cowper at [facility]    # Diabetic foot ulcer with osteomyelitis - abx per primary team  - see podiatry recommends vascular consult    # HTN  - hx of such  - avoid hypotension   # Metabolic acidosis - Start sodium bicarb 650 mg BID  # Hypokalemia - was repleted earlier; now acceptable   # Urinary hesitancy  -  Started flomax  - Repeat post-void residual and in/out cath if needed.  First was elevated at 257 mL   Disposition - would continue inpatient monitoring   Nan Aver, MD 05/27/2024 11:40 AM

## 2024-05-27 NOTE — Progress Notes (Signed)
 Progress Note   Patient: Joshua [name]:[id number] DOB: August 26, 1956 DOA: 05/25/2024  DOS: the patient was seen and examined on 05/27/2024   Brief hospital course:  68 y.o. male with medical history significant of CAD s/p multiple PCIs, HFpEF, HTN, DM2 c/b neuropathy, s/p R TMA in 2017, chronic poorly healing LLE heel wound, CKD4, OSA, and obesity p/w chronic poorly healing diabetic LLE foot ulcer c/f osteomyelitis.   Assessment and Plan:  Left foot diabetic ulcer with likely osteomyelitis - Podiatry initially consulted for possible debridement.  Initial consideration for salvage debridement with wound VAC however given patient's multiple previous surgeries benefit appreciated.  Recommending pursuit of BKA via vascular surgery consult.  Podiatry signing off.  At this time continue empiric broad-spectrum antibiotic coverage.  Will transition vancomycin  to linezolid  given acute kidney injury.  Will consult vascular surgery.  Acute kidney injury on CKD 4 - Likely worsening prerenal etiology in the setting of infection/osteomyelitis.  Nephrology consulted and following closely.  Holding Lasix  and IV fluids.  Will transition off of vancomycin  to linezolid .  Metabolic acidosis - Likely secondary to worsening renal function.  Sodium bicarb on board.  Diabetes mellitus - Insulin  sliding scale on board    Subjective: Patient resting comfortably this morning.  Denies any fever, chills, chest pain, nausea, vomiting abdominal pain.  Admits he is now amenable to BKA as he is tired of dealing with continued wound care hospital visits.  Physical Exam:  Vitals:   05/26/24 1430 05/26/24 2001 05/27/24 0518 05/27/24 0809  BP: 116/71 129/80 (!) 92/55 109/69  Pulse: 75 69 69 74  Resp: 16 18 16 18   Temp: 97.9 F (36.6 C) 97.7 F (36.5 C) 97.9 F (36.6 C) 97.9 F (36.6 C)  TempSrc:    Oral  SpO2: 100% 100% 100% 100%  Weight:      Height:        GENERAL:  Alert, pleasant, no acute distress   HEENT:  EOMI CARDIOVASCULAR:  RRR, no murmurs appreciated RESPIRATORY:  Clear to auscultation, no wheezing, rales, or rhonchi GASTROINTESTINAL:  Soft, nontender, nondistended EXTREMITIES: Fresh bandage over left foot NEURO:  No new focal deficits appreciated SKIN:  No rashes noted PSYCH:  Appropriate mood and affect     Data Reviewed:  No new imaging to review  Previous records (including but not limited to H&P, progress notes, nursing notes, TOC management) were reviewed in assessment of this patient.  Labs: CBC: Recent Labs  Lab 05/25/24 1354 05/25/24 2026 05/26/24 0647 05/27/24 0858  WBC 8.7 10.9* 8.4 7.5  NEUTROABS 6.2  --   --   --   HGB 12.8* 13.5 13.0 13.3  HCT 41.1 41.5 40.3 41.9  MCV 91.1 89.1 89.0 91.7  PLT 176 205 156 129*   Basic Metabolic Panel: Recent Labs  Lab 05/25/24 1354 05/25/24 2026 05/26/24 0647 05/26/24 1034 05/27/24 0858  NA 135  --  139  --  138  K 3.1*  --  3.1*  --  3.7  CL 101  --  104  --  105  CO2 19*  --  21*  --  18*  GLUCOSE 210*  --  168*  --  143*  BUN 73*  --  71*  --  70*  CREATININE 5.71* 5.57* 5.55*  --  5.26*  CALCIUM  8.5*  --  8.9  --  9.0  MG  --   --   --  2.1  --    Liver Function Tests: Recent Labs  Lab 05/23/24 1130 05/25/24 1354  AST 20 36  ALT 18 30  ALKPHOS 148* 123  BILITOT 0.4 0.4  PROT 7.2 6.7  ALBUMIN 3.8* 3.0*   CBG: Recent Labs  Lab 05/26/24 1102 05/26/24 1654 05/26/24 2224 05/27/24 0656 05/27/24 1138  GLUCAP 172* 187* 197* 139* 232*    Scheduled Meds:  aspirin   81 mg Oral Daily   DULoxetine   20 mg Oral BID   enoxaparin  (LOVENOX ) injection  30 mg Subcutaneous Q24H   famotidine   10 mg Oral QHS   gabapentin   100 mg Oral QHS   insulin  aspart  0-15 Units Subcutaneous TID WC   insulin  glargine-yfgn  20 Units Subcutaneous QHS   isosorbide  mononitrate  120 mg Oral Daily   latanoprost   1 drop Both Eyes QHS   metoprolol  succinate  100 mg Oral Daily   pantoprazole   40 mg Oral BID    rosuvastatin   10 mg Oral Daily   sodium bicarbonate   650 mg Oral BID   tamsulosin   0.4 mg Oral QPC supper   vancomycin  variable dose per unstable renal function (pharmacist dosing)   Does not apply See admin instructions   Continuous Infusions:  piperacillin -tazobactam (ZOSYN )  IV 2.25 g (05/27/24 0608)   PRN Meds:.acetaminophen   Family Communication: None at bedside  Disposition: Status is: Inpatient Remains inpatient appropriate because: Left foot osteo     Time spent: 36 minutes  Length of inpatient stay: 2 days  Author: Jodeane Mulligan, DO 05/27/2024 12:50 PM  For on call review [email or web address].

## 2024-05-27 NOTE — Progress Notes (Signed)
 ABI exam has been completed.   Results can be found under chart review under CV PROC. 05/27/2024 3:08 PM Mali Eppard RVT, RDMS

## 2024-05-27 NOTE — Hospital Course (Addendum)
 68 y.o. male with medical history significant of CAD s/p multiple PCIs, HFpEF, HTN, DM2 c/b neuropathy, s/p R TMA in 2017, chronic poorly healing LLE heel wound, CKD4, OSA, and obesity p/w chronic poorly healing diabetic LLE foot ulcer c/f osteomyelitis.    Assessment and Plan:   Left foot diabetic ulcer with likely osteomyelitis - Podiatry initially consulted for possible debridement.  Initial consideration for salvage debridement with wound VAC however given patient's multiple previous surgeries benefit appreciated.  Recommending pursuit of BKA via vascular surgery consult.  Podiatry signing off.  At this time continue empiric broad-spectrum antibiotic coverage.  Will transition vancomycin  to linezolid  given acute kidney injury.  Will consult vascular surgery.   Acute kidney injury on CKD 4 - Likely worsening prerenal etiology in the setting of infection/osteomyelitis.  Nephrology consulted and following closely.  Holding Lasix  and IV fluids.  Will transition off of vancomycin  to linezolid .   Metabolic acidosis - Likely secondary to worsening renal function.  Sodium bicarb on board.   Diabetes mellitus - Insulin  sliding scale on board

## 2024-05-28 DIAGNOSIS — E11621 Type 2 diabetes mellitus with foot ulcer: Secondary | ICD-10-CM | POA: Diagnosis not present

## 2024-05-28 DIAGNOSIS — E1142 Type 2 diabetes mellitus with diabetic polyneuropathy: Secondary | ICD-10-CM | POA: Diagnosis not present

## 2024-05-28 DIAGNOSIS — L97424 Non-pressure chronic ulcer of left heel and midfoot with necrosis of bone: Secondary | ICD-10-CM

## 2024-05-28 DIAGNOSIS — E11628 Type 2 diabetes mellitus with other skin complications: Secondary | ICD-10-CM | POA: Diagnosis not present

## 2024-05-28 DIAGNOSIS — Z794 Long term (current) use of insulin: Secondary | ICD-10-CM

## 2024-05-28 DIAGNOSIS — M86672 Other chronic osteomyelitis, left ankle and foot: Secondary | ICD-10-CM | POA: Diagnosis not present

## 2024-05-28 DIAGNOSIS — M869 Osteomyelitis, unspecified: Secondary | ICD-10-CM

## 2024-05-28 LAB — GLUCOSE, CAPILLARY
Glucose-Capillary: 173 mg/dL — ABNORMAL HIGH (ref 70–99)
Glucose-Capillary: 261 mg/dL — ABNORMAL HIGH (ref 70–99)
Glucose-Capillary: 175 mg/dL — ABNORMAL HIGH (ref 70–99)
Glucose-Capillary: 173 mg/dL — ABNORMAL HIGH (ref 70–99)

## 2024-05-28 LAB — PHOSPHORUS: Phosphorus: 5 mg/dL — ABNORMAL HIGH (ref 2.5–4.6)

## 2024-05-28 LAB — BASIC METABOLIC PANEL WITH GFR
Anion gap: 9 (ref 5–15)
BUN: 61 mg/dL — ABNORMAL HIGH (ref 8–23)
CO2: 22 mmol/L (ref 22–32)
Calcium: 8.5 mg/dL — ABNORMAL LOW (ref 8.9–10.3)
Chloride: 103 mmol/L (ref 98–111)
Creatinine, Ser: 4.82 mg/dL — ABNORMAL HIGH (ref 0.61–1.24)
GFR, Estimated: 13 mL/min — ABNORMAL LOW (ref >60)
Glucose, Bld: 165 mg/dL — ABNORMAL HIGH (ref 70–99)
Potassium: 2.9 mmol/L — ABNORMAL LOW (ref 3.5–5.1)
Sodium: 134 mmol/L — ABNORMAL LOW (ref 135–145)

## 2024-05-28 LAB — MAGNESIUM: Magnesium: 2.1 mg/dL (ref 1.7–2.4)

## 2024-05-28 MED ORDER — POTASSIUM CHLORIDE 10 MEQ/100ML IV SOLN
10.0000 meq | INTRAVENOUS | Status: AC
  Filled 2024-05-28 (×2): qty 100

## 2024-05-28 MED ORDER — POTASSIUM CHLORIDE 20 MEQ PO PACK
40.0000 meq | PACK | Freq: Two times a day (BID) | ORAL | Status: AC
  Administered 2024-05-28 (×2): 40 meq via ORAL
  Filled 2024-05-28 (×2): qty 2

## 2024-05-28 MED ORDER — INSULIN GLARGINE-YFGN 100 UNIT/ML ~~LOC~~ SOLN
25.0000 [IU] | Freq: Every day | SUBCUTANEOUS | Status: DC
  Administered 2024-05-29 – 2024-06-01 (×3): 25 [IU] via SUBCUTANEOUS
  Filled 2024-05-28 (×6): qty 0.25

## 2024-05-28 MED ORDER — SODIUM CHLORIDE 0.9 % IV SOLN
3.0000 g | Freq: Two times a day (BID) | INTRAVENOUS | Status: DC
  Administered 2024-05-28 – 2024-06-04 (×13): 3 g via INTRAVENOUS
  Filled 2024-05-28 (×15): qty 8

## 2024-05-28 NOTE — Progress Notes (Signed)
 Progress Note   Patient: Joshua [name] ZOX:[id number] DOB: 1956-06-08 DOA: 05/25/2024  DOS: the patient was seen and examined on 05/28/2024   Brief hospital course:  68 y.o. male with medical history significant of CAD s/p multiple PCIs, HFpEF, HTN, DM2 c/b neuropathy, s/p R TMA in 2017, chronic poorly healing LLE heel wound, CKD4, OSA, and obesity p/w chronic poorly healing diabetic LLE foot ulcer c/f osteomyelitis.    Assessment and Plan:   Left foot diabetic ulcer with likely osteomyelitis - Podiatry initially consulted for possible debridement.  Initial consideration for salvage debridement with wound VAC however given patient's multiple previous surgeries benefit appreciated.  Recommending pursuit of BKA via vascular surgery consult.  Podiatry signing off.  At this time continue empiric broad-spectrum antibiotic coverage with Unasyn  and linezolid .  Vascular surgery following closely.  Recommendation to consult with orthopedics for second opinion.  Both confirm that BKA should be pursued.  Likely later this week.   Acute kidney injury on CKD 4 - Likely worsening prerenal etiology in the setting of infection/osteomyelitis.  Nephrology consulted and following closely.  Holding Lasix  and IV fluids.  Transitioned off of vancomycin  to linezolid  6/15.  Creatinine mildly improved this morning.   Metabolic acidosis - Likely secondary to worsening renal function.  Sodium bicarb on board.  Showing improvement this morning.  Hypokalemia - Replenishment initiated.  Will recheck BMP and mag in AM.   Diabetes mellitus - Insulin  sliding scale on board   Subjective: Patient sitting up in bed, eating breakfast.  Feels well this morning.  Denies any fever, chills, chest pain, nausea, vomiting, abdominal pain.  Physical Exam:  Vitals:   05/27/24 1540 05/27/24 2150 05/28/24 0355 05/28/24 0718  BP: 113/74 131/79 (!) 98/59 102/65  Pulse: 72 71 68 86  Resp: 19 20 19 17   Temp: 98.1 F (36.7 C)  97.9 F (36.6 C) 97.6 F (36.4 C) 97.9 F (36.6 C)  TempSrc: Oral Oral Oral   SpO2: 100% 100% 100% 94%  Weight:      Height:        GENERAL:  Alert, pleasant, no acute distress  HEENT:  EOMI CARDIOVASCULAR:  RRR, no murmurs appreciated RESPIRATORY:  Clear to auscultation, no wheezing, rales, or rhonchi GASTROINTESTINAL:  Soft, nontender, nondistended EXTREMITIES: Fresh bandage over left foot, right foot ray amputation well-healed. NEURO:  No new focal deficits appreciated SKIN: Bandage over left foot PSYCH:  Appropriate mood and affect    Data Reviewed:  No new imaging to review  Previous records (including but not limited to H&P, progress notes, nursing notes, TOC management) were reviewed in assessment of this patient.  Labs: CBC: Recent Labs  Lab 05/25/24 1354 05/25/24 2026 05/26/24 0647 05/27/24 0858  WBC 8.7 10.9* 8.4 7.5  NEUTROABS 6.2  --   --   --   HGB 12.8* 13.5 13.0 13.3  HCT 41.1 41.5 40.3 41.9  MCV 91.1 89.1 89.0 91.7  PLT 176 205 156 129*   Basic Metabolic Panel: Recent Labs  Lab 05/25/24 1354 05/25/24 2026 05/26/24 0647 05/26/24 1034 05/27/24 0858 05/28/24 0621  NA 135  --  139  --  138 134*  K 3.1*  --  3.1*  --  3.7 2.9*  CL 101  --  104  --  105 103  CO2 19*  --  21*  --  18* 22  GLUCOSE 210*  --  168*  --  143* 165*  BUN 73*  --  71*  --  70*  61*  CREATININE 5.71* 5.57* 5.55*  --  5.26* 4.82*  CALCIUM  8.5*  --  8.9  --  9.0 8.5*  MG  --   --   --  2.1  --  2.1  PHOS  --   --   --   --   --  5.0*   Liver Function Tests: Recent Labs  Lab 05/23/24 1130 05/25/24 1354  AST 20 36  ALT 18 30  ALKPHOS 148* 123  BILITOT 0.4 0.4  PROT 7.2 6.7  ALBUMIN 3.8* 3.0*   CBG: Recent Labs  Lab 05/27/24 0656 05/27/24 1138 05/27/24 1538 05/27/24 2152 05/28/24 0836  GLUCAP 139* 232* 171* 148* 173*    Scheduled Meds:  aspirin   81 mg Oral Daily   DULoxetine   20 mg Oral BID   enoxaparin  (LOVENOX ) injection  30 mg Subcutaneous Q24H    famotidine   10 mg Oral QHS   gabapentin   100 mg Oral QHS   insulin  aspart  0-15 Units Subcutaneous TID WC   insulin  glargine-yfgn  20 Units Subcutaneous QHS   isosorbide  mononitrate  120 mg Oral Daily   latanoprost   1 drop Both Eyes QHS   metoprolol  succinate  100 mg Oral Daily   pantoprazole   40 mg Oral BID   potassium chloride   40 mEq Oral BID   rosuvastatin   10 mg Oral Daily   sodium bicarbonate   650 mg Oral BID   tamsulosin   0.4 mg Oral QPC supper   Continuous Infusions:  ampicillin -sulbactam (UNASYN ) IV     linezolid  (ZYVOX ) IV 600 mg (05/27/24 2159)   potassium chloride  10 mEq (05/28/24 1005)   PRN Meds:.acetaminophen   Family Communication: None at bedside  Disposition: Status is: Inpatient Remains inpatient appropriate because: Osteomyelitis, chronic ulcer     Time spent: 36 minutes  Length of inpatient stay: 3 days  Author: Jodeane Mulligan, DO 05/28/2024 10:52 AM  For on call review [email or web address].

## 2024-05-28 NOTE — Progress Notes (Signed)
 Patient ID: Joshua [name], male   DOB: January 17, 1956, 68 y.o.   MRN: [id number] I reviewed patient's MRI scan.  He has chronic osteomyelitis of the cuboid and base of the fifth metatarsal.  I feel the best surgical intervention would be a transtibial amputation.  I have discussed this with Dr. Charlotte Cookey and either Dr. Charlotte Cookey could proceed with surgery on Wednesday or I could proceed with surgery on Friday.  I will discuss my recommendations with the patient later today.

## 2024-05-28 NOTE — Progress Notes (Addendum)
  Progress Note    05/28/2024 8:28 AM * No surgery found *  Subjective:  no complaints   Vitals:   05/28/24 0355 05/28/24 0718  BP: (!) 98/59 102/65  Pulse: 68 86  Resp: 19 17  Temp: 97.6 F (36.4 C) 97.9 F (36.6 C)  SpO2: 100% 94%   Physical Exam: Lungs:  non labored Extremities:  dressing left in place L foot; palpable PT pulse Neurologic: A&O  CBC    Component Value Date/Time   WBC 7.5 05/27/2024 0858   RBC 4.57 05/27/2024 0858   HGB 13.3 05/27/2024 0858   HGB 12.3 (L) 08/11/2021 0929   HCT 41.9 05/27/2024 0858   HCT 39.1 08/11/2021 0929   PLT 129 (L) 05/27/2024 0858   PLT 254 08/11/2021 0929   MCV 91.7 05/27/2024 0858   MCV 84 08/11/2021 0929   MCH 29.1 05/27/2024 0858   MCHC 31.7 05/27/2024 0858   RDW 15.2 05/27/2024 0858   RDW 15.9 (H) 08/11/2021 0929   LYMPHSABS 1.2 05/25/2024 1354   LYMPHSABS 2.0 08/11/2021 0929   MONOABS 1.3 (H) 05/25/2024 1354   EOSABS 0.0 05/25/2024 1354   EOSABS 0.2 08/11/2021 0929   BASOSABS 0.0 05/25/2024 1354   BASOSABS 0.0 08/11/2021 0929    BMET    Component Value Date/Time   NA 134 (L) 05/28/2024 0621   NA 141 10/26/2021 1044   K 2.9 (L) 05/28/2024 0621   CL 103 05/28/2024 0621   CO2 22 05/28/2024 0621   GLUCOSE 165 (H) 05/28/2024 0621   BUN 61 (H) 05/28/2024 0621   BUN 33 (H) 10/26/2021 1044   CREATININE 4.82 (H) 05/28/2024 0621   CREATININE 2.11 (H) 11/18/2022 1605   CALCIUM  8.5 (L) 05/28/2024 0621   GFRNONAA 13 (L) 05/28/2024 0621   GFRNONAA 51 (L) 11/01/2016 1012   GFRAA 27 (L) 08/23/2019 0340   GFRAA 58 (L) 11/01/2016 1012    INR    Component Value Date/Time   INR 1.1 07/10/2023 2018     Intake/Output Summary (Last 24 hours) at 05/28/2024 0828 Last data filed at 05/28/2024 0300 Gross per 24 hour  Intake 1230 ml  Output 2300 ml  Net -1070 ml     Assessment/Plan:  68 y.o. male with chronic L foot wound  Osteomyelitis L fifth metatarsal He is optimized from a vascular standpoint based on  prior angiography and physical exam Dr. .Julio Ohm will be consulted to determine continued wound care vs amputation   Cordie Deters, PA-C [facility] [phone] 05/28/2024 8:28 AM  I agree with the above.  I spoke with Dr. Julio Ohm.  Because of the infectious issues, the best option is a below-knee amputation.  This will be scheduled for Thursday with Dr. Lanora Plana

## 2024-05-28 NOTE — Progress Notes (Signed)
 Patient is self sufficient with CPAP machine and says he will place on when he's ready to rest for the evening. Patient knows to tell RN to contact RT if he needs assistance.

## 2024-05-28 NOTE — Progress Notes (Signed)
 [facility] Progress Note  Name: Joshua [name] MRN: [id number] DOB: [birth year]  Chief Complaint:  Directed here by podiatry re: foot wound   Subjective:  Patient feels well with only back pain that significantly bothering him.  Feels like it is getting worse.  Feels like he has minimal edema  Review of systems:  10 systems reviewed and negative except per HPI --------------- Background on consult:  Joshua [name] is a 68 y.o. male with a history of chronic combined systolic and diastolic heart failure, hypertension, diabetes mellitus, and CKD who presented with a poorly-healing left diabetic foot ulcer.  He saw podiatrist outpatient who directed him here.  Note that he has had multiple I&D's and oral abx with podiatry and has previously declined amputation.  MRI on 6/4 with osteo as concern for myofasciitis.  He was admitted to medicine.  Vascular is being consulted per podiatry recs to re-eval BKA of the LLE.  He was found to have acute kidney injury on chronic kidney disease.  Note that his baseline creatinine is low 2's per limited recent data.  Creatinine has been 5's here.  Nephrology is consulted for assistance with evaluation and management of AKI.  He states that he has had to push with urination recently and previously had a good stream.  PVR was ordered yesterday but not done.  He was started on vanc/zosyn .  He had lost his home after divorce and was homeless for several months then was admitted to the hospital and then a facility.  States takes lasix  40 mg BID.  No NSAID's. Follows with Dr. Christianne Cowper and recently saw Joshua [name] in our office in the past week.  States cardiology wants to do cath but they're nervous about his kidneys; he has stents.  He sees them again in two months.  Per [facility] data, note Cr 4.26 on 05/21/24 labs with our office.  Cr 2.57 on 01/19/24 with GFR 27.     Intake/Output Summary (Last 24 hours) at 05/28/2024 1146 Last data filed at 05/28/2024  1100 Gross per 24 hour  Intake 1230 ml  Output 2800 ml  Net -1570 ml    Vitals:  Vitals:   05/27/24 1540 05/27/24 2150 05/28/24 0355 05/28/24 0718  BP: 113/74 131/79 (!) 98/59 102/65  Pulse: 72 71 68 86  Resp: 19 20 19 17   Temp: 98.1 F (36.7 C) 97.9 F (36.6 C) 97.6 F (36.4 C) 97.9 F (36.6 C)  TempSrc: Oral Oral Oral   SpO2: 100% 100% 100% 94%  Weight:      Height:         Physical Exam:  General adult male in bed in no acute distress HEENT normocephalic atraumatic extraocular movements intact sclera anicteric Neck supple trachea midline Lungs clear to auscultation bilaterally normal work of breathing at rest  Heart normal rate, no rub Abdomen soft nontender nondistended Extremities no edema; left foot wrapped Psych normal mood and affect Neuro alert and oriented x 3 provides hx and follows commands GU no foley   Medications reviewed   Labs:     Latest Ref Rng & Units 05/28/2024    6:21 AM 05/27/2024    8:58 AM 05/26/2024    6:47 AM  BMP  Glucose 70 - 99 mg/dL 413  244  010   BUN 8 - 23 mg/dL 61  70  71   Creatinine 0.61 - 1.24 mg/dL 2.72  5.36  6.44   Sodium 135 - 145 mmol/L 134  138  139   Potassium 3.5 - 5.1 mmol/L 2.9  3.7  3.1   Chloride 98 - 111 mmol/L 103  105  104   CO2 22 - 32 mmol/L 22  18  21    Calcium  8.9 - 10.3 mg/dL 8.5  9.0  8.9      Assessment/Plan:   # AKI  - Pre-renal insults in the setting of infection/osteo.  Likely will slowly improve with time - hold home lasix  again today - Defer fluids today - treat osteo as per primary - caution with vanc - see pharmacy is consulted for dosing   # CKD stage IV  - Baseline Cr mid to low 2's but may be closer to 3 based on limited recent data - Follows with Dr. Christianne Cowper at [facility]    # Diabetic foot ulcer with osteomyelitis - abx per primary team  - see podiatry recommends vascular consult    # HTN  - hx of such  - avoid hypotension   # Metabolic acidosis - Continue sodium  bicarbonate for now and consider stopping  # Hypokalemia - Replete as needed   # Urinary hesitancy  - Started flomax  - Repeat post-void residual and in/out cath if needed.  First was elevated at 257 mL   Disposition - would continue inpatient monitoring   Levorn Reason, MD 05/28/2024 11:46 AM

## 2024-05-28 NOTE — Care Management Important Message (Signed)
 Important Message  Patient Details  Name: Joshua [name] MRN: [id number] Date of Birth: 03/14/1956   Important Message Given:  Yes - Medicare IM     Felix Host 05/28/2024, 1:26 PM

## 2024-05-29 DIAGNOSIS — M86672 Other chronic osteomyelitis, left ankle and foot: Secondary | ICD-10-CM | POA: Diagnosis not present

## 2024-05-29 DIAGNOSIS — E11621 Type 2 diabetes mellitus with foot ulcer: Secondary | ICD-10-CM | POA: Diagnosis not present

## 2024-05-29 DIAGNOSIS — E11628 Type 2 diabetes mellitus with other skin complications: Secondary | ICD-10-CM | POA: Diagnosis not present

## 2024-05-29 DIAGNOSIS — E1142 Type 2 diabetes mellitus with diabetic polyneuropathy: Secondary | ICD-10-CM | POA: Diagnosis not present

## 2024-05-29 LAB — BASIC METABOLIC PANEL WITH GFR
Anion gap: 10 (ref 5–15)
BUN: 53 mg/dL — ABNORMAL HIGH (ref 8–23)
CO2: 22 mmol/L (ref 22–32)
Calcium: 8.9 mg/dL (ref 8.9–10.3)
Chloride: 105 mmol/L (ref 98–111)
Creatinine, Ser: 4.11 mg/dL — ABNORMAL HIGH (ref 0.61–1.24)
GFR, Estimated: 15 mL/min — ABNORMAL LOW (ref >60)
Glucose, Bld: 122 mg/dL — ABNORMAL HIGH (ref 70–99)
Potassium: 3.8 mmol/L (ref 3.5–5.1)
Sodium: 137 mmol/L (ref 135–145)

## 2024-05-29 LAB — CBC
HCT: 39.4 % (ref 39.0–52.0)
Hemoglobin: 12.8 g/dL — ABNORMAL LOW (ref 13.0–17.0)
MCH: 29.1 pg (ref 26.0–34.0)
MCHC: 32.5 g/dL (ref 30.0–36.0)
MCV: 89.5 fL (ref 80.0–100.0)
Platelets: 178 10*3/uL (ref 150–400)
RBC: 4.4 MIL/uL (ref 4.22–5.81)
RDW: 15.1 % (ref 11.5–15.5)
WBC: 7.5 10*3/uL (ref 4.0–10.5)
nRBC: 0 % (ref 0.0–0.2)

## 2024-05-29 LAB — GLUCOSE, CAPILLARY
Glucose-Capillary: 89 mg/dL (ref 70–99)
Glucose-Capillary: 136 mg/dL — ABNORMAL HIGH (ref 70–99)
Glucose-Capillary: 137 mg/dL — ABNORMAL HIGH (ref 70–99)
Glucose-Capillary: 127 mg/dL — ABNORMAL HIGH (ref 70–99)

## 2024-05-29 MED ORDER — FUROSEMIDE 40 MG PO TABS
40.0000 mg | ORAL_TABLET | Freq: Two times a day (BID) | ORAL | Status: DC
  Administered 2024-05-29 – 2024-06-06 (×18): 40 mg via ORAL
  Filled 2024-05-29 (×18): qty 1

## 2024-05-29 MED ORDER — LINEZOLID 600 MG PO TABS
600.0000 mg | ORAL_TABLET | Freq: Two times a day (BID) | ORAL | Status: DC
  Administered 2024-05-29 – 2024-05-30 (×3): 600 mg via ORAL
  Filled 2024-05-29 (×3): qty 1

## 2024-05-29 MED ORDER — CYCLOBENZAPRINE HCL 5 MG PO TABS
5.0000 mg | ORAL_TABLET | Freq: Three times a day (TID) | ORAL | Status: DC | PRN
  Administered 2024-05-29 – 2024-06-05 (×11): 5 mg via ORAL
  Filled 2024-05-29 (×12): qty 1

## 2024-05-29 MED ORDER — OXYCODONE HCL 5 MG PO TABS
5.0000 mg | ORAL_TABLET | ORAL | Status: DC | PRN
  Administered 2024-05-30: 5 mg via ORAL
  Filled 2024-05-29: qty 1
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
## 2024-05-29 NOTE — Progress Notes (Signed)
 Progress Note   Patient: Joshua [name] VWU:[id number] DOB: 11-06-1956 DOA: 05/25/2024  DOS: the patient was seen and examined on 05/29/2024   Brief hospital course:  68 y.o. male with medical history significant of CAD s/p multiple PCIs, HFpEF, HTN, DM2 c/b neuropathy, s/p R TMA in 2017, chronic poorly healing LLE heel wound, CKD4, OSA, and obesity p/w chronic poorly healing diabetic LLE foot ulcer c/f osteomyelitis.    Assessment and Plan:   Left foot diabetic ulcer with likely osteomyelitis - Podiatry initially consulted for possible debridement.  Initial consideration for salvage debridement with wound VAC however given patient's multiple previous surgeries benefit appreciated.  Recommending pursuit of BKA via vascular surgery consult.  Podiatry signing off.  At this time continue empiric broad-spectrum antibiotic coverage with Unasyn  and linezolid  (transition to PO today).  Vascular surgery and orthopedic surgery following closely.  Recommendation for BKA later this week.   Acute kidney injury on CKD 4 - Likely worsening prerenal etiology in the setting of infection/osteomyelitis.  Nephrology consulted and following closely.  restarting Lasix  today.  Transitioned off of vancomycin  to linezolid  6/15.  Creatinine mildly improved this morning.  Nephrology signing off given continued improvement.   Metabolic acidosis - Likely secondary to worsening renal function.  Sodium bicarb on board.  Showing improvement this morning.   Hypokalemia - Replenishment initiated.  Will recheck BMP and mag in AM.   Diabetes mellitus - Insulin  sliding scale on board   Subjective: Patient resting comfortably this morning.  Minimal to BKA in the next couple days.  Denies shortness of breath, fever, chills, nausea, vomiting.  Physical Exam:  Vitals:   05/28/24 0718 05/28/24 1458 05/28/24 2113 05/29/24 0739  BP: 102/65 113/71 126/71 (!) 101/56  Pulse: 86 71 72 74  Resp: 17 17 18    Temp: 97.9 F (36.6  C) 97.6 F (36.4 C) 98 F (36.7 C) 97.8 F (36.6 C)  TempSrc:      SpO2: 94% 100% 100% 100%  Weight:      Height:        GENERAL:  Alert, pleasant, no acute distress  HEENT:  EOMI CARDIOVASCULAR:  RRR, no murmurs appreciated RESPIRATORY:  Clear to auscultation, no wheezing, rales, or rhonchi GASTROINTESTINAL:  Soft, nontender, nondistended EXTREMITIES: Fresh bandage over left foot, right foot ray amputation well-healed. NEURO:  No new focal deficits appreciated SKIN: Bandage over left foot PSYCH:  Appropriate mood and affect    Data Reviewed:  No new imaging to review  Previous records (including but not limited to H&P, progress notes, nursing notes, TOC management) were reviewed in assessment of this patient.  Labs: CBC: Recent Labs  Lab 05/25/24 1354 05/25/24 2026 05/26/24 0647 05/27/24 0858 05/29/24 0604  WBC 8.7 10.9* 8.4 7.5 7.5  NEUTROABS 6.2  --   --   --   --   HGB 12.8* 13.5 13.0 13.3 12.8*  HCT 41.1 41.5 40.3 41.9 39.4  MCV 91.1 89.1 89.0 91.7 89.5  PLT 176 205 156 129* 178   Basic Metabolic Panel: Recent Labs  Lab 05/25/24 1354 05/25/24 2026 05/26/24 0647 05/26/24 1034 05/27/24 0858 05/28/24 0621 05/29/24 0604  NA 135  --  139  --  138 134* 137  K 3.1*  --  3.1*  --  3.7 2.9* 3.8  CL 101  --  104  --  105 103 105  CO2 19*  --  21*  --  18* 22 22  GLUCOSE 210*  --  168*  --  143* 165* 122*  BUN 73*  --  71*  --  70* 61* 53*  CREATININE 5.71* 5.57* 5.55*  --  5.26* 4.82* 4.11*  CALCIUM  8.5*  --  8.9  --  9.0 8.5* 8.9  MG  --   --   --  2.1  --  2.1  --   PHOS  --   --   --   --   --  5.0*  --    Liver Function Tests: Recent Labs  Lab 05/23/24 1130 05/25/24 1354  AST 20 36  ALT 18 30  ALKPHOS 148* 123  BILITOT 0.4 0.4  PROT 7.2 6.7  ALBUMIN 3.8* 3.0*   CBG: Recent Labs  Lab 05/28/24 1148 05/28/24 1614 05/28/24 2113 05/29/24 0741 05/29/24 1128  GLUCAP 261* 175* 173* 89 136*    Scheduled Meds:  aspirin   81 mg Oral Daily    DULoxetine   20 mg Oral BID   enoxaparin  (LOVENOX ) injection  30 mg Subcutaneous Q24H   famotidine   10 mg Oral QHS   furosemide   40 mg Oral BID   gabapentin   100 mg Oral QHS   insulin  aspart  0-15 Units Subcutaneous TID WC   insulin  glargine-yfgn  25 Units Subcutaneous QHS   isosorbide  mononitrate  120 mg Oral Daily   latanoprost   1 drop Both Eyes QHS   linezolid   600 mg Oral Q12H   metoprolol  succinate  100 mg Oral Daily   pantoprazole   40 mg Oral BID   rosuvastatin   10 mg Oral Daily   tamsulosin   0.4 mg Oral QPC supper   Continuous Infusions:  ampicillin -sulbactam (UNASYN ) IV 3 g (05/29/24 1305)   PRN Meds:.acetaminophen , cyclobenzaprine , oxyCODONE   Family Communication: None at bedside  Disposition: Status is: Inpatient Remains inpatient appropriate because: Pending BKA     Time spent: 33 minutes  Length of inpatient stay: 4 days  Author: Jodeane Mulligan, DO 05/29/2024 1:29 PM  For on call review [email or web address].

## 2024-05-29 NOTE — Progress Notes (Addendum)
  Progress Note    05/29/2024 7:24 AM Hospital Day 4  Subjective:  no questions.  Tells me he recently got a role in play that read through are supposed to start Saturday.  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    Afebrile   Vitals:   05/28/24 1458 05/28/24 2113  BP: 113/71 126/71  Pulse: 71 72  Resp: 17 18  Temp: 97.6 F (36.4 C) 98 F (36.7 C)  SpO2: 100% 100%    Physical Exam: General:  non distress Lungs:  non labored   CBC    Component Value Date/Time   WBC 7.5 05/29/2024 0604   RBC 4.40 05/29/2024 0604   HGB 12.8 (L) 05/29/2024 0604   HGB 12.3 (L) 08/11/2021 0929   HCT 39.4 05/29/2024 0604   HCT 39.1 08/11/2021 0929   PLT 178 05/29/2024 0604   PLT 254 08/11/2021 0929   MCV 89.5 05/29/2024 0604   MCV 84 08/11/2021 0929   MCH 29.1 05/29/2024 0604   MCHC 32.5 05/29/2024 0604   RDW 15.1 05/29/2024 0604   RDW 15.9 (H) 08/11/2021 0929   LYMPHSABS 1.2 05/25/2024 1354   LYMPHSABS 2.0 08/11/2021 0929   MONOABS 1.3 (H) 05/25/2024 1354   EOSABS 0.0 05/25/2024 1354   EOSABS 0.2 08/11/2021 0929   BASOSABS 0.0 05/25/2024 1354   BASOSABS 0.0 08/11/2021 0929    BMET    Component Value Date/Time   NA 137 05/29/2024 0604   NA 141 10/26/2021 1044   K 3.8 05/29/2024 0604   CL 105 05/29/2024 0604   CO2 22 05/29/2024 0604   GLUCOSE 122 (H) 05/29/2024 0604   BUN 53 (H) 05/29/2024 0604   BUN 33 (H) 10/26/2021 1044   CREATININE 4.11 (H) 05/29/2024 0604   CREATININE 2.11 (H) 11/18/2022 1605   CALCIUM  8.9 05/29/2024 0604   GFRNONAA 15 (L) 05/29/2024 0604   GFRNONAA 51 (L) 11/01/2016 1012   GFRAA 27 (L) 08/23/2019 0340   GFRAA 58 (L) 11/01/2016 1012    INR    Component Value Date/Time   INR 1.1 07/10/2023 2018     Intake/Output Summary (Last 24 hours) at 05/29/2024 0724 Last data filed at 05/28/2024 1700 Gross per 24 hour  Intake 720 ml  Output 500 ml  Net 220 ml     Assessment/Plan:  68 y.o. male with left 5th metatarsal osteomyelitis   Hospital Day 4  -plan for left BKA on Thursday with Dr. Susi Eric.  Discussed with pt and currently does not have any questions.  Will check in on him tomorrow.    [name], PA-C [facility] [phone] 05/29/2024 7:24 AM   I agree with the above.  I have seen and evaluated the patient.  After discussions with podiatry Dr. Duda as well as the patient, the only option at this point is to proceed with left below-knee amputation.  The patient wants to proceed.  He has been scheduled for Thursday with Dr. Susi Eric.  All questions were answered.  Gareld June

## 2024-05-29 NOTE — Plan of Care (Signed)

## 2024-05-29 NOTE — Progress Notes (Signed)
 [facility] Progress Note  Name: Joshua [name] MRN: [id number] DOB: Apr 01, 1956  Chief Complaint:  Directed here by podiatry re: foot wound   Subjective:  Continues to have back pain that is unchanged.  Urinating well.  Planning for surgery at some point this week for his leg  Review of systems:  10 systems reviewed and negative except per HPI --------------- Background on consult:  Joshua [name] is a 68 y.o. male with a history of chronic combined systolic and diastolic heart failure, hypertension, diabetes mellitus, and CKD who presented with a poorly-healing left diabetic foot ulcer.  He saw podiatrist outpatient who directed him here.  Note that he has had multiple I&D's and oral abx with podiatry and has previously declined amputation.  MRI on 6/4 with osteo as concern for myofasciitis.  He was admitted to medicine.  Vascular is being consulted per podiatry recs to re-eval BKA of the LLE.  He was found to have acute kidney injury on chronic kidney disease.  Note that his baseline creatinine is low 2's per limited recent data.  Creatinine has been 5's here.  Nephrology is consulted for assistance with evaluation and management of AKI.  He states that he has had to push with urination recently and previously had a good stream.  PVR was ordered yesterday but not done.  He was started on vanc/zosyn .  He had lost his home after divorce and was homeless for several months then was admitted to the hospital and then a facility.  States takes lasix  40 mg BID.  No NSAID's. Follows with Dr. Christianne Cowper and recently saw Portia Brittle in our office in the past week.  States cardiology wants to do cath but they're nervous about his kidneys; he has stents.  He sees them again in two months.  Per [facility] data, note Cr 4.26 on 05/21/24 labs with our office.  Cr 2.57 on 01/19/24 with GFR 27.     Intake/Output Summary (Last 24 hours) at 05/29/2024 1052 Last data filed at 05/29/2024 0900 Gross per 24 hour   Intake 720 ml  Output 500 ml  Net 220 ml    Vitals:  Vitals:   05/28/24 0718 05/28/24 1458 05/28/24 2113 05/29/24 0739  BP: 102/65 113/71 126/71 (!) 101/56  Pulse: 86 71 72 74  Resp: 17 17 18    Temp: 97.9 F (36.6 C) 97.6 F (36.4 C) 98 F (36.7 C) 97.8 F (36.6 C)  TempSrc:      SpO2: 94% 100% 100% 100%  Weight:      Height:         Physical Exam:  General adult male in bed in no acute distress HEENT normocephalic atraumatic extraocular movements intact sclera anicteric Neck supple trachea midline Lungs clear to auscultation bilaterally normal work of breathing at rest  Heart normal rate, no rub Abdomen soft nontender nondistended Extremities no edema; left foot wrapped Psych normal mood and affect Neuro alert and oriented x 3 provides hx and follows commands GU no foley   Medications reviewed   Labs:     Latest Ref Rng & Units 05/29/2024    6:04 AM 05/28/2024    6:21 AM 05/27/2024    8:58 AM  BMP  Glucose 70 - 99 mg/dL 409  811  914   BUN 8 - 23 mg/dL 53  61  70   Creatinine 0.61 - 1.24 mg/dL 7.82  9.56  2.13   Sodium 135 - 145 mmol/L 137  134  138  Potassium 3.5 - 5.1 mmol/L 3.8  2.9  3.7   Chloride 98 - 111 mmol/L 105  103  105   CO2 22 - 32 mmol/L 22  22  18    Calcium  8.9 - 10.3 mg/dL 8.9  8.5  9.0      Assessment/Plan:   # AKI  - Pre-renal insults in the setting of infection/osteo.  Creatinine now improving and likely will head towards baseline with time -Restart home Lasix  - treat osteo as per primary - caution with vanc - see pharmacy is consulted for dosing   # CKD stage IV  - Baseline Cr mid to low 2's but may be closer to 3 based on limited recent data - Follows with Dr. Christianne Cowper at [facility]    # Diabetic foot ulcer with osteomyelitis - abx per primary team  - see podiatry recommends vascular consult    # HTN  - hx of such  - avoid hypotension   # Metabolic acidosis - Improved.  Will stop sodium bicarbonate   # Hypokalemia -  Replete as needed   # Urinary hesitancy  - Started flomax  - Repeat post-void residual and in/out cath if needed.  First was elevated at 257 mL   Given the patient's improvement we will sign off at this time.  Please do not hesitate to contact us  if further help is needed  Levorn Reason, MD 05/29/2024 10:52 AM

## 2024-05-30 DIAGNOSIS — M86672 Other chronic osteomyelitis, left ankle and foot: Secondary | ICD-10-CM | POA: Diagnosis not present

## 2024-05-30 DIAGNOSIS — L089 Local infection of the skin and subcutaneous tissue, unspecified: Secondary | ICD-10-CM | POA: Diagnosis not present

## 2024-05-30 DIAGNOSIS — E11628 Type 2 diabetes mellitus with other skin complications: Secondary | ICD-10-CM | POA: Diagnosis not present

## 2024-05-30 LAB — CBC
HCT: 37.3 % — ABNORMAL LOW (ref 39.0–52.0)
Hemoglobin: 12 g/dL — ABNORMAL LOW (ref 13.0–17.0)
MCH: 29.1 pg (ref 26.0–34.0)
MCHC: 32.2 g/dL (ref 30.0–36.0)
MCV: 90.5 fL (ref 80.0–100.0)
Platelets: 161 10*3/uL (ref 150–400)
RBC: 4.12 MIL/uL — ABNORMAL LOW (ref 4.22–5.81)
RDW: 15.1 % (ref 11.5–15.5)
WBC: 6.1 10*3/uL (ref 4.0–10.5)
nRBC: 0 % (ref 0.0–0.2)

## 2024-05-30 LAB — BASIC METABOLIC PANEL WITH GFR
Anion gap: 12 (ref 5–15)
BUN: 44 mg/dL — ABNORMAL HIGH (ref 8–23)
CO2: 20 mmol/L — ABNORMAL LOW (ref 22–32)
Calcium: 8.6 mg/dL — ABNORMAL LOW (ref 8.9–10.3)
Chloride: 104 mmol/L (ref 98–111)
Creatinine, Ser: 3.56 mg/dL — ABNORMAL HIGH (ref 0.61–1.24)
GFR, Estimated: 18 mL/min — ABNORMAL LOW (ref >60)
Glucose, Bld: 89 mg/dL (ref 70–99)
Potassium: 3.6 mmol/L (ref 3.5–5.1)
Sodium: 136 mmol/L (ref 135–145)

## 2024-05-30 LAB — GLUCOSE, CAPILLARY
Glucose-Capillary: 99 mg/dL (ref 70–99)
Glucose-Capillary: 139 mg/dL — ABNORMAL HIGH (ref 70–99)
Glucose-Capillary: 137 mg/dL — ABNORMAL HIGH (ref 70–99)
Glucose-Capillary: 167 mg/dL — ABNORMAL HIGH (ref 70–99)

## 2024-05-30 LAB — MRSA NEXT GEN BY PCR, NASAL: MRSA by PCR Next Gen: NOT DETECTED

## 2024-05-30 MED ORDER — ORAL CARE MOUTH RINSE: 15.0000 mL | OROMUCOSAL | Status: DC | PRN

## 2024-05-30 MED ORDER — DOXYCYCLINE HYCLATE 100 MG PO TABS
100.0000 mg | ORAL_TABLET | Freq: Two times a day (BID) | ORAL | Status: DC
  Administered 2024-05-30 – 2024-06-04 (×11): 100 mg via ORAL
  Filled 2024-05-30 (×11): qty 1

## 2024-05-30 NOTE — Progress Notes (Signed)
   05/30/24 2320  BiPAP/CPAP/SIPAP  $ Non-Invasive Home Ventilator  Subsequent  BiPAP/CPAP/SIPAP Pt Type Adult  BiPAP/CPAP/SIPAP DREAMSTATIOND  Mask Type Full face mask  Mask Size Medium  Patient Home Machine No  Patient Home Mask No  Patient Home Tubing No  Device Plugged into RED Power Outlet Yes  BiPAP/CPAP /SiPAP Vitals  Pulse Rate 75  Resp 18  SpO2 99 %  Bilateral Breath Sounds Diminished

## 2024-05-30 NOTE — Plan of Care (Signed)

## 2024-05-30 NOTE — Progress Notes (Signed)
 [facility] PROGRESS NOTE    Progress Note  Joshua [name]  QMV:[id number] DOB: May 06, 1956 DOA: 05/25/2024 PCP: Ilsa Maltese, PA     Brief Narrative:   Joshua [name] is an 68 y.o. male past medical history of CAD with multiple PCI's, HFpEF, essential hypertension diabetes mellitus type 2 with peripheral neuropathy status post right metatarsal amputation 2017, with 40 left lower extremity healing wound, chronic kidney see stage IV, obesity, he relates he has been left lower extremity daily for years he follows up with ID and podiatry multiple rounds of antibiotics.  He has been offered right lower extremity amputation which he has refused.  Presented on the day of admission to the podiatrist office was sent to the ED with left foot diabetic ulcer with osteomyelitis   Assessment/Plan:   Left foot ulcer with osteomyelitis/ Diabetic foot infection [facility]): Podiatry was consulted recommended vascular surgery consult and orthopedic. He was started empirically on antibiotics, on doxycycline  and Unasyn . Patient is scheduled for left BKA on 05/31/2024.  Acute kidney injury on chronic disease stage IV: Nephrology was consulted recommended to restart Lasix  and transition vancomycin  to Doxy. They have signed off. Avoid hypotension.  Metabolic acidosis: Likely due to chronic renal disease, now on sodium bicarb tabs.  Hypokalemia: Repleted now improved.  Anemia of chronic renal disease  Globin has remained relatively stable.  Diabetes mellitus type 2: Continue sliding scale insulin .  Essential hypertension: ARB has been discontinued, continue metoprolol , Lasix  and Imdur    DVT prophylaxis: Lovenox  Family Communication:none Status is: Inpatient Remains inpatient appropriate because: Diabetic foot ulcer infection    Code Status:     Code Status Orders  (From admission, onward)           Start     Ordered   05/25/24 1738  Full code  Continuous       Question:   By:  Answer:  Consent: discussion documented in EHR   05/25/24 1738           Code Status History     Date Active Date Inactive Code Status Order ID Comments User Context   07/10/2023 2224 07/13/2023 2313 Full Code [id number]  Roxana Copier, DO ED   11/26/2022 0242 12/10/2022 0140 Full Code [id number]  Roxana Copier, DO ED   11/06/2022 0106 11/16/2022 2122 Full Code [id number]  Kenny Peals, MD ED   09/25/2020 0128 09/28/2020 1948 Full Code [id number]  Chotiner, Pauline Bos, MD ED   08/16/2019 2048 08/23/2019 1620 Full Code [id number]  Veronica Gordon, MD Inpatient   04/06/2019 2342 04/10/2019 1818 Full Code [id number]  Carles Cheadle, MD Inpatient   03/15/2018 0011 03/22/2018 1936 Full Code [id number]  Graciela Lava, MD ED   05/19/2017 1908 05/23/2017 1549 Full Code [id number]  Walton Guppy, MD ED   10/11/2016 1645 10/14/2016 0025 Full Code [id number]  Abel Hoe, PA-C ED   08/03/2016 1807 08/13/2016 1323 Full Code [id number]  Sterling Eisenmenger, PA-C Inpatient   08/03/2016 1807 08/03/2016 1807 Full Code [id number]  Sterling Eisenmenger, PA-C Inpatient   07/18/2016 2307 08/03/2016 1802 Full Code [id number]  Dorsey Gault, MD Inpatient   07/30/2015 1139 07/31/2015 2018 Full Code [id number]  Odie Benne, MD Inpatient   07/29/2015 0705 07/30/2015 1139 Full Code [id number]  Hazle Lites, MD Inpatient   07/03/2014 1222 07/04/2014 1349 Full Code [id number]  Arty Binning, MD Inpatient   10/29/2013 0258 11/02/2013 1958 Full Code [id number]  Tyra Galley,  MD Inpatient         IV Access:   Peripheral IV   Procedures and diagnostic studies:   No results found.   Medical Consultants:   None.   Subjective:    Joshua [name] no complains  Objective:    Vitals:   05/29/24 1358 05/29/24 2056 05/30/24 0524 05/30/24 0757  BP: 135/77 (!) 143/80 120/73 123/76  Pulse: 75 71 73 73  Resp:   16 18  Temp: 97.8 F (36.6 C) 98.1 F (36.7 C) 98.3 F (36.8 C) 97.9 F (36.6 C)   TempSrc:  Oral Oral   SpO2: 100%  100% 99%  Weight:      Height:       SpO2: 99 % FiO2 (%): 32 %   Intake/Output Summary (Last 24 hours) at 05/30/2024 0858 Last data filed at 05/29/2024 1500 Gross per 24 hour  Intake 540 ml  Output --  Net 540 ml   Filed Weights   05/25/24 1332  Weight: 109.3 kg    Exam: General exam: In no acute distress. Respiratory system: Good air movement and clear to auscultation. Cardiovascular system: S1 & S2 heard, RRR. No JVD. Gastrointestinal system: Abdomen is nondistended, soft and nontender.  Extremities: No pedal edema. Skin: No rashes, lesions or ulcers Psychiatry: Judgement and insight appear normal. Mood & affect appropriate.    Data Reviewed:    Labs: Basic Metabolic Panel: Recent Labs  Lab 05/26/24 0647 05/26/24 1034 05/27/24 0858 05/28/24 0621 05/29/24 0604 05/30/24 0548  NA 139  --  138 134* 137 136  K 3.1*  --  3.7 2.9* 3.8 3.6  CL 104  --  105 103 105 104  CO2 21*  --  18* 22 22 20*  GLUCOSE 168*  --  143* 165* 122* 89  BUN 71*  --  70* 61* 53* 44*  CREATININE 5.55*  --  5.26* 4.82* 4.11* 3.56*  CALCIUM  8.9  --  9.0 8.5* 8.9 8.6*  MG  --  2.1  --  2.1  --   --   PHOS  --   --   --  5.0*  --   --    GFR Estimated Creatinine Clearance: 25.2 mL/min (A) (by C-G formula based on SCr of 3.56 mg/dL (H)). Liver Function Tests: Recent Labs  Lab 05/23/24 1130 05/25/24 1354  AST 20 36  ALT 18 30  ALKPHOS 148* 123  BILITOT 0.4 0.4  PROT 7.2 6.7  ALBUMIN 3.8* 3.0*   No results for input(s): LIPASE, AMYLASE in the last 168 hours. No results for input(s): AMMONIA in the last 168 hours. Coagulation profile No results for input(s): INR, PROTIME in the last 168 hours. COVID-19 Labs  No results for input(s): DDIMER, FERRITIN, LDH, CRP in the last 72 hours.  Lab Results  Component Value Date   SARSCOV2NAA NEGATIVE 07/10/2023   SARSCOV2NAA NEGATIVE 09/25/2020   SARSCOV2NAA NEGATIVE 08/16/2019     CBC: Recent Labs  Lab 05/25/24 1354 05/25/24 2026 05/26/24 0647 05/27/24 0858 05/29/24 0604 05/30/24 0548  WBC 8.7 10.9* 8.4 7.5 7.5 6.1  NEUTROABS 6.2  --   --   --   --   --   HGB 12.8* 13.5 13.0 13.3 12.8* 12.0*  HCT 41.1 41.5 40.3 41.9 39.4 37.3*  MCV 91.1 89.1 89.0 91.7 89.5 90.5  PLT 176 205 156 129* 178 161   Cardiac Enzymes: No results for input(s): CKTOTAL, CKMB, CKMBINDEX, TROPONINI in the last 168 hours. BNP (last  3 results) No results for input(s): PROBNP in the last 8760 hours. CBG: Recent Labs  Lab 05/29/24 0741 05/29/24 1128 05/29/24 1604 05/29/24 2105 05/30/24 0559  GLUCAP 89 136* 137* 127* 99   D-Dimer: No results for input(s): DDIMER in the last 72 hours. Hgb A1c: No results for input(s): HGBA1C in the last 72 hours. Lipid Profile: No results for input(s): CHOL, HDL, LDLCALC, TRIG, CHOLHDL, LDLDIRECT in the last 72 hours. Thyroid  function studies: No results for input(s): TSH, T4TOTAL, T3FREE, THYROIDAB in the last 72 hours.  Invalid input(s): FREET3 Anemia work up: No results for input(s): VITAMINB12, FOLATE, FERRITIN, TIBC, IRON, RETICCTPCT in the last 72 hours. Sepsis Labs: Recent Labs  Lab 05/25/24 1402 05/25/24 2026 05/26/24 0647 05/27/24 0858 05/29/24 0604 05/30/24 0548  WBC  --    < > 8.4 7.5 7.5 6.1  LATICACIDVEN 1.4  --   --   --   --   --    < > = values in this interval not displayed.   Microbiology No results found for this or any previous visit (from the past 240 hours).   Medications:    aspirin   81 mg Oral Daily   DULoxetine   20 mg Oral BID   enoxaparin  (LOVENOX ) injection  30 mg Subcutaneous Q24H   famotidine   10 mg Oral QHS   furosemide   40 mg Oral BID   gabapentin   100 mg Oral QHS   insulin  aspart  0-15 Units Subcutaneous TID WC   insulin  glargine-yfgn  25 Units Subcutaneous QHS   isosorbide  mononitrate  120 mg Oral Daily   latanoprost   1 drop Both Eyes QHS    linezolid   600 mg Oral Q12H   metoprolol  succinate  100 mg Oral Daily   pantoprazole   40 mg Oral BID   rosuvastatin   10 mg Oral Daily   tamsulosin   0.4 mg Oral QPC supper   Continuous Infusions:  ampicillin -sulbactam (UNASYN ) IV 3 g (05/30/24 0236)      LOS: 5 days   Macdonald Savoy  [facility]  05/30/2024, 8:58 AM

## 2024-05-30 NOTE — Plan of Care (Signed)
  Problem: Education: Goal: Ability to describe self-care measures that may prevent or decrease complications (Diabetes Survival Skills Education) will improve Outcome: Progressing   Problem: Fluid Volume: Goal: Ability to maintain a balanced intake and output will improve Outcome: Progressing   Problem: Metabolic: Goal: Ability to maintain appropriate glucose levels will improve Outcome: Progressing   Problem: Skin Integrity: Goal: Risk for impaired skin integrity will decrease Outcome: Progressing

## 2024-05-30 NOTE — Progress Notes (Addendum)
  Progress Note    05/30/2024 7:05 AM Hospital Day 5  Subjective:  sleeping, wakes easily  afebrile  Vitals:   05/29/24 2056 05/30/24 0524  BP: (!) 143/80 120/73  Pulse: 71 73  Resp:  16  Temp: 98.1 F (36.7 C) 98.3 F (36.8 C)  SpO2:  100%    Physical Exam: General:  no distress Lungs:  non labored   CBC    Component Value Date/Time   WBC 6.1 05/30/2024 0548   RBC 4.12 (L) 05/30/2024 0548   HGB 12.0 (L) 05/30/2024 0548   HGB 12.3 (L) 08/11/2021 0929   HCT 37.3 (L) 05/30/2024 0548   HCT 39.1 08/11/2021 0929   PLT 161 05/30/2024 0548   PLT 254 08/11/2021 0929   MCV 90.5 05/30/2024 0548   MCV 84 08/11/2021 0929   MCH 29.1 05/30/2024 0548   MCHC 32.2 05/30/2024 0548   RDW 15.1 05/30/2024 0548   RDW 15.9 (H) 08/11/2021 0929   LYMPHSABS 1.2 05/25/2024 1354   LYMPHSABS 2.0 08/11/2021 0929   MONOABS 1.3 (H) 05/25/2024 1354   EOSABS 0.0 05/25/2024 1354   EOSABS 0.2 08/11/2021 0929   BASOSABS 0.0 05/25/2024 1354   BASOSABS 0.0 08/11/2021 0929    BMET    Component Value Date/Time   NA 137 05/29/2024 0604   NA 141 10/26/2021 1044   K 3.8 05/29/2024 0604   CL 105 05/29/2024 0604   CO2 22 05/29/2024 0604   GLUCOSE 122 (H) 05/29/2024 0604   BUN 53 (H) 05/29/2024 0604   BUN 33 (H) 10/26/2021 1044   CREATININE 4.11 (H) 05/29/2024 0604   CREATININE 2.11 (H) 11/18/2022 1605   CALCIUM  8.9 05/29/2024 0604   GFRNONAA 15 (L) 05/29/2024 0604   GFRNONAA 51 (L) 11/01/2016 1012   GFRAA 27 (L) 08/23/2019 0340   GFRAA 58 (L) 11/01/2016 1012    INR    Component Value Date/Time   INR 1.1 07/10/2023 2018     Intake/Output Summary (Last 24 hours) at 05/30/2024 0705 Last data filed at 05/29/2024 1500 Gross per 24 hour  Intake 540 ml  Output --  Net 540 ml     Assessment/Plan:  68 y.o. male  with left 5th metatarsal osteomyelitis   Hospital Day 5  -plan for left BKA tomorrow with Dr. Susi Eric.  -no questions. -consent/npo/labs ordered.   Joshua Smart,  PA-C [facility] [phone] 05/30/2024 7:05 AM  I agree with the above.  Plan for left BKA tomorrow  Joshua [name]

## 2024-05-31 ENCOUNTER — Other Ambulatory Visit: Payer: Self-pay

## 2024-05-31 ENCOUNTER — Inpatient Hospital Stay (HOSPITAL_COMMUNITY)

## 2024-05-31 ENCOUNTER — Encounter (HOSPITAL_COMMUNITY)
Admission: EM | Disposition: A | Discharge status: Skilled Nursing Facility | Payer: Self-pay | Source: Ambulatory Visit | Attending: Internal Medicine

## 2024-05-31 ENCOUNTER — Encounter (HOSPITAL_COMMUNITY): Payer: Self-pay | Admitting: Hospitalist

## 2024-05-31 DIAGNOSIS — L089 Local infection of the skin and subcutaneous tissue, unspecified: Secondary | ICD-10-CM | POA: Diagnosis not present

## 2024-05-31 DIAGNOSIS — L97522 Non-pressure chronic ulcer of other part of left foot with fat layer exposed: Secondary | ICD-10-CM

## 2024-05-31 DIAGNOSIS — L0889 Other specified local infections of the skin and subcutaneous tissue: Secondary | ICD-10-CM

## 2024-05-31 DIAGNOSIS — E11621 Type 2 diabetes mellitus with foot ulcer: Secondary | ICD-10-CM

## 2024-05-31 DIAGNOSIS — I5032 Chronic diastolic (congestive) heart failure: Secondary | ICD-10-CM | POA: Diagnosis not present

## 2024-05-31 DIAGNOSIS — I110 Hypertensive heart disease with heart failure: Secondary | ICD-10-CM

## 2024-05-31 DIAGNOSIS — M86672 Other chronic osteomyelitis, left ankle and foot: Secondary | ICD-10-CM | POA: Diagnosis not present

## 2024-05-31 DIAGNOSIS — I70292 Other atherosclerosis of native arteries of extremities, left leg: Secondary | ICD-10-CM

## 2024-05-31 DIAGNOSIS — E11628 Type 2 diabetes mellitus with other skin complications: Secondary | ICD-10-CM | POA: Diagnosis not present

## 2024-05-31 LAB — BASIC METABOLIC PANEL WITH GFR
Anion gap: 12 (ref 5–15)
BUN: 42 mg/dL — ABNORMAL HIGH (ref 8–23)
CO2: 21 mmol/L — ABNORMAL LOW (ref 22–32)
Calcium: 8.5 mg/dL — ABNORMAL LOW (ref 8.9–10.3)
Chloride: 104 mmol/L (ref 98–111)
Creatinine, Ser: 3.19 mg/dL — ABNORMAL HIGH (ref 0.61–1.24)
GFR, Estimated: 21 mL/min — ABNORMAL LOW (ref >60)
Glucose, Bld: 110 mg/dL — ABNORMAL HIGH (ref 70–99)
Potassium: 3.9 mmol/L (ref 3.5–5.1)
Sodium: 137 mmol/L (ref 135–145)

## 2024-05-31 LAB — GLUCOSE, CAPILLARY
Glucose-Capillary: 113 mg/dL — ABNORMAL HIGH (ref 70–99)
Glucose-Capillary: 121 mg/dL — ABNORMAL HIGH (ref 70–99)
Glucose-Capillary: 122 mg/dL — ABNORMAL HIGH (ref 70–99)
Glucose-Capillary: 237 mg/dL — ABNORMAL HIGH (ref 70–99)
Glucose-Capillary: 207 mg/dL — ABNORMAL HIGH (ref 70–99)

## 2024-05-31 LAB — CBC
HCT: 37.4 % — ABNORMAL LOW (ref 39.0–52.0)
Hemoglobin: 11.8 g/dL — ABNORMAL LOW (ref 13.0–17.0)
MCH: 28.9 pg (ref 26.0–34.0)
MCHC: 31.6 g/dL (ref 30.0–36.0)
MCV: 91.7 fL (ref 80.0–100.0)
Platelets: 151 10*3/uL (ref 150–400)
RBC: 4.08 MIL/uL — ABNORMAL LOW (ref 4.22–5.81)
RDW: 15.1 % (ref 11.5–15.5)
WBC: 6.1 10*3/uL (ref 4.0–10.5)
nRBC: 0 % (ref 0.0–0.2)

## 2024-05-31 SURGERY — AMPUTATION BELOW KNEE
Anesthesia: General | Site: Knee | Laterality: Left

## 2024-05-31 MED ORDER — MORPHINE SULFATE (PF) 4 MG/ML IV SOLN: 4.0000 mg | INTRAVENOUS | Status: DC | PRN

## 2024-05-31 MED ORDER — PROPOFOL 10 MG/ML IV BOLUS
INTRAVENOUS | Status: DC | PRN
  Administered 2024-05-31: 200 mg via INTRAVENOUS

## 2024-05-31 MED ORDER — SODIUM CHLORIDE 0.9 % IV SOLN
INTRAVENOUS | Status: DC | PRN
Start: 2024-05-31 — End: 2024-05-31

## 2024-05-31 MED ORDER — FENTANYL CITRATE (PF) 100 MCG/2ML IJ SOLN
INTRAMUSCULAR | Status: AC
  Filled 2024-05-31: qty 2

## 2024-05-31 MED ORDER — PROPOFOL 10 MG/ML IV BOLUS
INTRAVENOUS | Status: AC
  Filled 2024-05-31: qty 20

## 2024-05-31 MED ORDER — ONDANSETRON HCL 4 MG/2ML IJ SOLN
INTRAMUSCULAR | Status: DC | PRN
  Administered 2024-05-31: 4 mg via INTRAVENOUS

## 2024-05-31 MED ORDER — 0.9 % SODIUM CHLORIDE (POUR BTL) OPTIME
TOPICAL | Status: DC | PRN
  Administered 2024-05-31: 1000 mL

## 2024-05-31 MED ORDER — DEXAMETHASONE SODIUM PHOSPHATE 10 MG/ML IJ SOLN
INTRAMUSCULAR | Status: DC | PRN
  Administered 2024-05-31: 10 mg via INTRAVENOUS

## 2024-05-31 MED ORDER — MIDAZOLAM HCL 2 MG/2ML IJ SOLN
INTRAMUSCULAR | Status: AC
  Filled 2024-05-31: qty 2

## 2024-05-31 MED ORDER — LACTATED RINGERS IV SOLN: INTRAVENOUS | Status: DC

## 2024-05-31 MED ORDER — ROPIVACAINE HCL 5 MG/ML IJ SOLN
INTRAMUSCULAR | Status: DC | PRN
  Administered 2024-05-31: 50 mL via PERINEURAL

## 2024-05-31 MED ORDER — PHENYLEPHRINE HCL-NACL 20-0.9 MG/250ML-% IV SOLN
INTRAVENOUS | Status: DC | PRN
  Administered 2024-05-31: 50 ug/min via INTRAVENOUS

## 2024-05-31 MED ORDER — PHENYLEPHRINE 80 MCG/ML (10ML) SYRINGE FOR IV PUSH (FOR BLOOD PRESSURE SUPPORT)
PREFILLED_SYRINGE | INTRAVENOUS | Status: DC | PRN
  Administered 2024-05-31: 240 ug via INTRAVENOUS

## 2024-05-31 MED ORDER — PHENYLEPHRINE 80 MCG/ML (10ML) SYRINGE FOR IV PUSH (FOR BLOOD PRESSURE SUPPORT)
PREFILLED_SYRINGE | INTRAVENOUS | Status: AC
Start: 2024-05-31 — End: 2024-05-31
  Filled 2024-05-31: qty 10

## 2024-05-31 MED ORDER — ONDANSETRON HCL 4 MG/2ML IJ SOLN
INTRAMUSCULAR | Status: AC
  Filled 2024-05-31: qty 2

## 2024-05-31 MED ORDER — OXYCODONE HCL 5 MG PO TABS
5.0000 mg | ORAL_TABLET | ORAL | Status: DC | PRN
  Administered 2024-05-31 – 2024-06-02 (×7): 10 mg via ORAL
  Administered 2024-06-02 – 2024-06-04 (×2): 5 mg via ORAL
  Administered 2024-06-05 – 2024-06-06 (×2): 10 mg via ORAL
  Filled 2024-05-31 (×5): qty 2
  Filled 2024-05-31: qty 1
  Filled 2024-05-31 (×2): qty 2
  Filled 2024-05-31: qty 1
  Filled 2024-05-31 (×2): qty 2

## 2024-05-31 MED ORDER — EPHEDRINE SULFATE-NACL 50-0.9 MG/10ML-% IV SOSY
PREFILLED_SYRINGE | INTRAVENOUS | Status: DC | PRN
  Administered 2024-05-31 (×2): 10 mg via INTRAVENOUS
  Administered 2024-05-31: 5 mg via INTRAVENOUS

## 2024-05-31 MED ORDER — EPHEDRINE 5 MG/ML INJ
INTRAVENOUS | Status: AC
Start: 2024-05-31 — End: 2024-05-31
  Filled 2024-05-31: qty 5

## 2024-05-31 MED ORDER — ORAL CARE MOUTH RINSE: 15.0000 mL | Freq: Once | OROMUCOSAL | Status: AC

## 2024-05-31 MED ORDER — DEXAMETHASONE SODIUM PHOSPHATE 10 MG/ML IJ SOLN
INTRAMUSCULAR | Status: AC
  Filled 2024-05-31: qty 1

## 2024-05-31 MED ORDER — CHLORHEXIDINE GLUCONATE 0.12 % MT SOLN
OROMUCOSAL | Status: AC
  Administered 2024-05-31: 15 mL via OROMUCOSAL
  Filled 2024-05-31: qty 15

## 2024-05-31 MED ORDER — METOPROLOL SUCCINATE ER 25 MG PO TB24
ORAL_TABLET | ORAL | Status: AC
  Administered 2024-05-31: 100 mg via ORAL
  Filled 2024-05-31: qty 4

## 2024-05-31 MED ORDER — CHLORHEXIDINE GLUCONATE 0.12 % MT SOLN: 15.0000 mL | Freq: Once | OROMUCOSAL | Status: AC

## 2024-05-31 MED ORDER — PROPOFOL 10 MG/ML IV BOLUS
INTRAVENOUS | Status: AC
Start: 2024-05-31 — End: 2024-05-31
  Filled 2024-05-31: qty 20

## 2024-05-31 SURGICAL SUPPLY — 47 items
BAG COUNTER SPONGE SURGICOUNT (BAG) ×1 IMPLANT
BANDAGE ESMARK 6X9 LF (GAUZE/BANDAGES/DRESSINGS) IMPLANT
BLADE LONG MED 31X9 (MISCELLANEOUS) IMPLANT
BLADE SAW GIGLI 510 (BLADE) ×1 IMPLANT
BLADE SAW SGTL 73X25 THK (BLADE) ×1 IMPLANT
BNDG COHESIVE 6X5 TAN ST LF (GAUZE/BANDAGES/DRESSINGS) ×1 IMPLANT
BNDG ELASTIC 4X5.8 VLCR STR LF (GAUZE/BANDAGES/DRESSINGS) IMPLANT
BNDG ELASTIC 6INX 5YD STR LF (GAUZE/BANDAGES/DRESSINGS) IMPLANT
BNDG GAUZE DERMACEA FLUFF 4 (GAUZE/BANDAGES/DRESSINGS) IMPLANT
BUR DISC 0.8X25 (BURR) IMPLANT
CANISTER SUCTION 3000ML PPV (SUCTIONS) ×1 IMPLANT
CLIP TI MEDIUM 6 (CLIP) ×1 IMPLANT
CLIP TI WIDE RED SMALL 6 (CLIP) ×1 IMPLANT
COVER SURGICAL LIGHT HANDLE (MISCELLANEOUS) ×1 IMPLANT
CUFF TOURN SGL QUICK 42 (TOURNIQUET CUFF) IMPLANT
CUFF TRNQT CYL 34X4.125X (TOURNIQUET CUFF) IMPLANT
DRAIN CHANNEL 19F RND (DRAIN) IMPLANT
DRAPE HALF SHEET 40X57 (DRAPES) ×1 IMPLANT
DRAPE SURG ORHT 6 SPLT 77X108 (DRAPES) ×2 IMPLANT
DRESSING PREVENA PLUS CUSTOM (GAUZE/BANDAGES/DRESSINGS) IMPLANT
DRSG ADAPTIC 3X8 NADH LF (GAUZE/BANDAGES/DRESSINGS) ×1 IMPLANT
ELECTRODE REM PT RTRN 9FT ADLT (ELECTROSURGICAL) ×1 IMPLANT
EVACUATOR SILICONE 100CC (DRAIN) IMPLANT
GAUZE SPONGE 4X4 12PLY STRL (GAUZE/BANDAGES/DRESSINGS) ×1 IMPLANT
GLOVE BIOGEL PI IND STRL 7.0 (GLOVE) ×1 IMPLANT
GOWN STRL REUS W/ TWL LRG LVL3 (GOWN DISPOSABLE) ×2 IMPLANT
GOWN STRL REUS W/ TWL XL LVL3 (GOWN DISPOSABLE) ×1 IMPLANT
KIT BASIN OR (CUSTOM PROCEDURE TRAY) ×1 IMPLANT
KIT TURNOVER KIT B (KITS) ×1 IMPLANT
NS IRRIG 1000ML POUR BTL (IV SOLUTION) ×1 IMPLANT
PACK GENERAL/GYN (CUSTOM PROCEDURE TRAY) ×1 IMPLANT
PAD ARMBOARD POSITIONER FOAM (MISCELLANEOUS) ×2 IMPLANT
POWDER SURGICEL 3.0 GRAM (HEMOSTASIS) IMPLANT
RASP HELIOCORDIAL MED (MISCELLANEOUS) IMPLANT
STAPLER SKIN PROX 35W (STAPLE) ×1 IMPLANT
STOCKINETTE IMPERVIOUS LG (DRAPES) ×1 IMPLANT
SUT BONE WAX W31G (SUTURE) IMPLANT
SUT ETHIBOND NAB MH 2-0 36IN (SUTURE) IMPLANT
SUT ETHILON 3 0 PS 1 (SUTURE) IMPLANT
SUT SILK 0 TIES 10X30 (SUTURE) ×1 IMPLANT
SUT SILK 2 0 SH CR/8 (SUTURE) ×1 IMPLANT
SUT SILK 2-0 18XBRD TIE 12 (SUTURE) ×1 IMPLANT
SUT SILK 3-0 18XBRD TIE 12 (SUTURE) IMPLANT
SUT VIC AB 2-0 CT1 18 (SUTURE) ×2 IMPLANT
TOWEL GREEN STERILE (TOWEL DISPOSABLE) ×2 IMPLANT
UNDERPAD 30X36 HEAVY ABSORB (UNDERPADS AND DIAPERS) ×1 IMPLANT
WATER STERILE IRR 1000ML POUR (IV SOLUTION) ×1 IMPLANT

## 2024-05-31 NOTE — Anesthesia Procedure Notes (Signed)
 Procedure Name: LMA Insertion Date/Time: 05/31/2024 9:22 AM  Performed by: Andee Bamberger, CRNAPre-anesthesia Checklist: Patient identified, Emergency Drugs available, Suction available, Patient being monitored and Timeout performed Patient Re-evaluated:Patient Re-evaluated prior to induction Oxygen  Delivery Method: Circle system utilized Preoxygenation: Pre-oxygenation with 100% oxygen  Induction Type: IV induction LMA: LMA inserted LMA Size: 4.0 Number of attempts: 1 Tube secured with: Tape

## 2024-05-31 NOTE — TOC Initial Note (Signed)
 Transition of Care [facility]) - Initial/Assessment Note    Patient Details  Name: Joshua [name] MRN: [id number] Date of Birth: [birth year]  Transition of Care [facility]) CM/SW Contact:    Alisa App, RN Phone Number: 05/31/2024, 3:28 PM  Clinical Narrative:                  L diabetic foot infection     - S/P Left below knee amputation, 6/19  From  home with son and son's family. PTA independent with ADL's, no DME usage.  Barriers to Discharge: Continued Medical Work up   Patient Goals and CMS Choice            Expected Discharge Plan and Services   Discharge Planning Services: CM Consult   Living arrangements for the past 2 months: Single Family Home                                      Prior Living Arrangements/Services Living arrangements for the past 2 months: Single Family Home Lives with:: Adult Children Patient language and need for interpreter reviewed:: Yes Do you feel safe going back to the place where you live?: Yes      Need for Family Participation in Patient Care: Yes (Comment) Care giver support system in place?: No (comment)   Criminal Activity/Legal Involvement Pertinent to Current Situation/Hospitalization: No - Comment as needed  Activities of Daily Living   ADL Screening (condition at time of admission) Independently performs ADLs?: Yes (appropriate for developmental age) Is the patient deaf or have difficulty hearing?: No Does the patient have difficulty seeing, even when wearing glasses/contacts?: No Does the patient have difficulty concentrating, remembering, or making decisions?: No  Permission Sought/Granted      Share Information with NAME: Styles Fambro  [phone]           Emotional Assessment   Attitude/Demeanor/Rapport: Engaged Affect (typically observed): Accepting Orientation: : Oriented to Self, Oriented to  Time, Oriented to Place, Oriented to Situation   Psych Involvement: No (comment)  Admission  diagnosis:  Diabetic foot infection (HCC) [Q65.784, L08.9] Patient Active Problem List   Diagnosis Date Noted   Vitamin D  deficiency 07/28/2023   DKA (diabetic ketoacidosis) (HCC) 07/10/2023   Pharyngitis 07/10/2023   Lactic acidosis 07/10/2023   SIRS (systemic inflammatory response syndrome) (HCC) 07/10/2023   Elevated troponin 07/10/2023   Diabetic foot ulcer (HCC) 12/02/2022   Acute osteomyelitis of metatarsal bone of left foot (HCC) 11/30/2022   Left foot pain 11/26/2022   Protein-calorie malnutrition, severe 11/08/2022   Osteomyelitis (HCC) 11/06/2022   Diabetic foot infection (HCC) 11/06/2022   Osteomyelitis of fifth toe of left foot (HCC) 11/05/2022   (HFpEF) heart failure with preserved ejection fraction (HCC) 11/05/2022   CKD (chronic kidney disease), stage IV (HCC) 11/05/2022   Dysphagia 03/16/2022   History of colonic polyps 03/16/2022   Spinal stenosis of lumbar region 11/03/2021   Mixed diabetic hyperlipidemia associated with type 2 diabetes mellitus (HCC) 08/11/2021   Chronic low back pain    Hypokalemia 09/25/2020   Insulin  dependent type 2 diabetes mellitus (HCC) 09/24/2020   Unstable angina (HCC) 08/17/2019   Cardiac chest pain 08/16/2019   NSTEMI (non-ST elevated myocardial infarction) (HCC) 04/06/2019   ACS (acute coronary syndrome) (HCC) 04/06/2019   Sepsis (HCC) 03/15/2018   DKA (diabetic ketoacidosis) (HCC) 03/15/2018   Preop cardiovascular exam 08/22/2017   Acute  renal failure superimposed on stage 3b chronic kidney disease (HCC) 05/19/2017   Essential hypertension    Status post transmetatarsal amputation of foot, right (HCC) 11/08/2016   Diabetic polyneuropathy associated with type 2 diabetes mellitus (HCC) 11/08/2016   Pure hypercholesterolemia    AKI (acute kidney injury) (HCC)    Acute blood loss anemia    Diabetes mellitus (HCC)    Physical debility 08/03/2016   Chronic osteomyelitis of right foot (HCC)    Cellulitis and abscess of leg, except  foot    Non-healing wound of left lower extremity    Stage 3b chronic kidney disease (CKD) (HCC)    Unstable angina pectoris (HCC) 07/18/2016   Chronic diastolic CHF (congestive heart failure) (HCC)    Chest pain with moderate risk for cardiac etiology 06/24/2014   CAD S/P multiple PCIs 06/24/2014   Sleep apnea- on C-pap 06/24/2014   Class 3 severe obesity with serious comorbidity and body mass index (BMI) of 45.0 to 49.9 in adult 11/10/2013   Hyperglycemia 10/29/2013   Hyperlipidemia 01/12/2011   Benign essential HTN 01/12/2011   GERD (gastroesophageal reflux disease) 01/12/2011   Osteomyelitis of left foot (HCC) 01/12/2011   PCP:  Ilsa Maltese, PA Pharmacy:   [facility] - [place], [place] - [address] [place] [address] Phone: [phone] Fax: [phone]  [place] [facility] [address] [place] [address] Phone: [phone] Fax: [phone]     Social Drivers of Health (SDOH) Social History: SDOH Screenings   Food Insecurity: Food Insecurity Present (05/25/2024)  Housing: Low Risk  (05/25/2024)  Transportation Needs: Unmet Transportation Needs (05/25/2024)  Utilities: At Risk (05/25/2024)  Alcohol Screen: Medium Risk (07/27/2023)  Depression (PHQ2-9): Low Risk  (07/27/2023)  Financial Resource Strain: Medium Risk (07/27/2023)  Physical Activity: Inactive (07/27/2023)  Social Connections: Unknown (05/25/2024)  Stress: Stress Concern Present (07/27/2023)  Tobacco Use: Low Risk  (05/31/2024)   SDOH Interventions:     Readmission Risk Interventions    11/08/2022    2:21 PM  Readmission Risk Prevention Plan  Transportation Screening Complete  PCP or Specialist Appt within 5-7 Days Complete  Home Care Screening Complete  Medication Review (RN CM) Complete

## 2024-05-31 NOTE — Progress Notes (Signed)
 [facility] PROGRESS NOTE    Progress Note  Joshua [name]  FAO:[id number] DOB: 14-Jun-1956 DOA: 05/25/2024 PCP: Ilsa Maltese, PA    Brief Narrative:   Joshua [name] is an 68 y.o. male past medical history of CAD with multiple PCI's, HFpEF, essential hypertension diabetes mellitus type 2 with peripheral neuropathy status post right metatarsal amputation 2017, with 40 left lower extremity healing wound, chronic kidney see stage IV, obesity, he relates he has been left lower extremity daily for years he follows up with ID and podiatry multiple rounds of antibiotics.  He has been offered right lower extremity amputation which he has refused.  Presented on the day of admission to the podiatrist office was sent to the ED with left foot diabetic ulcer with osteomyelitis. Assessment/Plan:   Left foot ulcer with osteomyelitis/ Diabetic foot infection [facility]): Podiatry was consulted recommended vascular surgery consult and orthopedic. He was started empirically on antibiotics, on doxycycline  and Unasyn . S/p left BKA on 05/31/2024.  Acute kidney injury on chronic disease stage IV: Nephrology was consulted recommended to restart Lasix  and transition vancomycin  to Doxy. They have signed off. Avoid hypotension.  Metabolic acidosis: Likely due to chronic renal disease, now on sodium bicarb tabs.  Hypokalemia: Repleted now improved.  Anemia of chronic renal disease  Globin has remained relatively stable.  Diabetes mellitus type 2: Continue sliding scale insulin .  Essential hypertension: ARB has been discontinued, continue metoprolol , Lasix  and Imdur    DVT prophylaxis: Lovenox  Family Communication:none Status is: Inpatient Remains inpatient appropriate because: Diabetic foot ulcer infection    Code Status:     Code Status Orders  (From admission, onward)           Start     Ordered   05/25/24 1738  Full code  Continuous       Question:  By:  Answer:  Consent:  discussion documented in EHR   05/25/24 1738           Code Status History     Date Active Date Inactive Code Status Order ID Comments User Context   07/10/2023 2224 07/13/2023 2313 Full Code [id number]  Roxana Copier, DO ED   11/26/2022 0242 12/10/2022 0140 Full Code [id number]  Roxana Copier, DO ED   11/06/2022 0106 11/16/2022 2122 Full Code [id number]  Kenny Peals, MD ED   09/25/2020 0128 09/28/2020 1948 Full Code [id number]  Chotiner, Pauline Bos, MD ED   08/16/2019 2048 08/23/2019 1620 Full Code [id number]  Veronica Gordon, MD Inpatient   04/06/2019 2342 04/10/2019 1818 Full Code [id number]  Carles Cheadle, MD Inpatient   03/15/2018 0011 03/22/2018 1936 Full Code [id number]  Graciela Lava, MD ED   05/19/2017 1908 05/23/2017 1549 Full Code [id number]  Walton Guppy, MD ED   10/11/2016 1645 10/14/2016 0025 Full Code [id number]  Abel Hoe, PA-C ED   08/03/2016 1807 08/13/2016 1323 Full Code [id number]  Sterling Eisenmenger, PA-C Inpatient   08/03/2016 1807 08/03/2016 1807 Full Code [id number]  Sterling Eisenmenger, PA-C Inpatient   07/18/2016 2307 08/03/2016 1802 Full Code [id number]  Dorsey Gault, MD Inpatient   07/30/2015 1139 07/31/2015 2018 Full Code [id number]  Odie Benne, MD Inpatient   07/29/2015 0705 07/30/2015 1139 Full Code [id number]  Hazle Lites, MD Inpatient   07/03/2014 1222 07/04/2014 1349 Full Code [id number]  Arty Binning, MD Inpatient   10/29/2013 0258 11/02/2013 1958 Full Code [id number]  Tyra Galley, MD Inpatient  IV Access:   Peripheral IV   Procedures and diagnostic studies:   No results found.   Medical Consultants:   None.   Subjective:    GUHAN BRUINGTON no complains  Objective:    Vitals:   05/31/24 0419 05/31/24 0803 05/31/24 0900 05/31/24 0905  BP: (!) 129/52 (!) 144/76 (!) 156/73 116/66  Pulse: 72 79 79 78  Resp: 18 19 16 14   Temp: (!) 97.5 F (36.4 C) 97.9 F (36.6 C)    TempSrc: Axillary Oral    SpO2: 100% 99% 96%  97%  Weight:  110.2 kg    Height:  5' 9 (1.753 m)     SpO2: 97 % O2 Flow Rate (L/min): 2 L/min FiO2 (%): 32 %   Intake/Output Summary (Last 24 hours) at 05/31/2024 0948 Last data filed at 05/31/2024 0600 Gross per 24 hour  Intake 240 ml  Output 700 ml  Net -460 ml   Filed Weights   05/25/24 1332 05/31/24 0803  Weight: 109.3 kg 110.2 kg    Exam: General exam: In no acute distress. Respiratory system: Good air movement and clear to auscultation. Cardiovascular system: S1 & S2 heard, RRR. No JVD. Gastrointestinal system: Abdomen is nondistended, soft and nontender.  Extremities above-the-knee amputation Skin: No rashes, lesions or ulcers Psychiatry: Judgement and insight appear normal. Mood & affect appropriate. Data Reviewed:    Labs: Basic Metabolic Panel: Recent Labs  Lab 05/26/24 1034 05/27/24 0858 05/28/24 8119 05/29/24 0604 05/30/24 0548 05/31/24 0527  NA  --  138 134* 137 136 137  K  --  3.7 2.9* 3.8 3.6 3.9  CL  --  105 103 105 104 104  CO2  --  18* 22 22 20* 21*  GLUCOSE  --  143* 165* 122* 89 110*  BUN  --  70* 61* 53* 44* 42*  CREATININE  --  5.26* 4.82* 4.11* 3.56* 3.19*  CALCIUM   --  9.0 8.5* 8.9 8.6* 8.5*  MG 2.1  --  2.1  --   --   --   PHOS  --   --  5.0*  --   --   --    GFR Estimated Creatinine Clearance: 28.2 mL/min (A) (by C-G formula based on SCr of 3.19 mg/dL (H)). Liver Function Tests: Recent Labs  Lab 05/25/24 1354  AST 36  ALT 30  ALKPHOS 123  BILITOT 0.4  PROT 6.7  ALBUMIN 3.0*   No results for input(s): LIPASE, AMYLASE in the last 168 hours. No results for input(s): AMMONIA in the last 168 hours. Coagulation profile No results for input(s): INR, PROTIME in the last 168 hours. COVID-19 Labs  No results for input(s): DDIMER, FERRITIN, LDH, CRP in the last 72 hours.  Lab Results  Component Value Date   SARSCOV2NAA NEGATIVE 07/10/2023   SARSCOV2NAA NEGATIVE 09/25/2020   SARSCOV2NAA NEGATIVE 08/16/2019     CBC: Recent Labs  Lab 05/25/24 1354 05/25/24 2026 05/26/24 0647 05/27/24 0858 05/29/24 0604 05/30/24 0548 05/31/24 0527  WBC 8.7   < > 8.4 7.5 7.5 6.1 6.1  NEUTROABS 6.2  --   --   --   --   --   --   HGB 12.8*   < > 13.0 13.3 12.8* 12.0* 11.8*  HCT 41.1   < > 40.3 41.9 39.4 37.3* 37.4*  MCV 91.1   < > 89.0 91.7 89.5 90.5 91.7  PLT 176   < > 156 129* 178 161 151   < > =  values in this interval not displayed.   Cardiac Enzymes: No results for input(s): CKTOTAL, CKMB, CKMBINDEX, TROPONINI in the last 168 hours. BNP (last 3 results) No results for input(s): PROBNP in the last 8760 hours. CBG: Recent Labs  Lab 05/30/24 1144 05/30/24 1647 05/30/24 2147 05/31/24 0609 05/31/24 0810  GLUCAP 139* 137* 167* 113* 121*   D-Dimer: No results for input(s): DDIMER in the last 72 hours. Hgb A1c: No results for input(s): HGBA1C in the last 72 hours. Lipid Profile: No results for input(s): CHOL, HDL, LDLCALC, TRIG, CHOLHDL, LDLDIRECT in the last 72 hours. Thyroid  function studies: No results for input(s): TSH, T4TOTAL, T3FREE, THYROIDAB in the last 72 hours.  Invalid input(s): FREET3 Anemia work up: No results for input(s): VITAMINB12, FOLATE, FERRITIN, TIBC, IRON, RETICCTPCT in the last 72 hours. Sepsis Labs: Recent Labs  Lab 05/25/24 1402 05/25/24 2026 05/27/24 0858 05/29/24 0604 05/30/24 0548 05/31/24 0527  WBC  --    < > 7.5 7.5 6.1 6.1  LATICACIDVEN 1.4  --   --   --   --   --    < > = values in this interval not displayed.   Microbiology Recent Results (from the past 240 hours)  MRSA Next Gen by PCR, Nasal     Status: None   Collection Time: 05/30/24  8:16 AM   Specimen: Nasal Mucosa; Nasal Swab  Result Value Ref Range Status   MRSA by PCR Next Gen NOT DETECTED NOT DETECTED Final    Comment: (NOTE) The GeneXpert MRSA Assay (FDA approved for NASAL specimens only), is one component of a comprehensive MRSA  colonization surveillance program. It is not intended to diagnose MRSA infection nor to guide or monitor treatment for MRSA infections. Test performance is not FDA approved in patients less than 67 years old. Performed at [facility], [address] [place], [place] [address]      Medications:    [MAR Hold] aspirin   81 mg Oral Daily   [MAR Hold] doxycycline   100 mg Oral Q12H   [MAR Hold] DULoxetine   20 mg Oral BID   [MAR Hold] enoxaparin  (LOVENOX ) injection  30 mg Subcutaneous Q24H   [MAR Hold] famotidine   10 mg Oral QHS   [MAR Hold] furosemide   40 mg Oral BID   [MAR Hold] gabapentin   100 mg Oral QHS   [MAR Hold] insulin  aspart  0-15 Units Subcutaneous TID WC   [MAR Hold] insulin  glargine-yfgn  25 Units Subcutaneous QHS   [MAR Hold] isosorbide  mononitrate  120 mg Oral Daily   [MAR Hold] latanoprost   1 drop Both Eyes QHS   [MAR Hold] metoprolol  succinate  100 mg Oral Daily   [MAR Hold] pantoprazole   40 mg Oral BID   [MAR Hold] rosuvastatin   10 mg Oral Daily   [MAR Hold] tamsulosin   0.4 mg Oral QPC supper   Continuous Infusions:  [MAR Hold] ampicillin -sulbactam (UNASYN ) IV 3 g (05/31/24 0138)   lactated ringers         LOS: 6 days   Macdonald Savoy  [facility]  05/31/2024, 9:48 AM

## 2024-05-31 NOTE — Interval H&P Note (Signed)
 History and Physical Interval Note:  05/31/2024 8:49 AM  Joshua [name]  has presented today for surgery, with the diagnosis of ulcer L foot.  The various methods of treatment have been discussed with the patient and family. After consideration of risks, benefits and other options for treatment, the patient has consented to  Procedure(s): AMPUTATION BELOW KNEE (Left) as a surgical intervention.  The patient's history has been reviewed, patient examined, no change in status, stable for surgery.  I have reviewed the patient's chart and labs.  Questions were answered to the patient's satisfaction.     Carlene Che

## 2024-05-31 NOTE — Anesthesia Procedure Notes (Signed)
 Anesthesia Regional Block: Adductor canal block   Pre-Anesthetic Checklist: , timeout performed,  Correct Patient, Correct Site, Correct Laterality,  Correct Procedure, Correct Position, site marked,  Risks and benefits discussed,  Surgical consent,  Pre-op evaluation,  At surgeon's request and post-op pain management  Laterality: Left  Prep: chloraprep       Needles:  Injection technique: Single-shot  Needle Type: Stimiplex     Needle Length: 9cm  Needle Gauge: 21     Additional Needles:   Procedures:,,,, ultrasound used (permanent image in chart),,    Narrative:  Start time: 05/31/2024 9:05 AM End time: 05/31/2024 9:10 AM Injection made incrementally with aspirations every 5 mL.  Performed by: Personally  Anesthesiologist: Earvin Goldberg, MD

## 2024-05-31 NOTE — Anesthesia Preprocedure Evaluation (Addendum)
 Anesthesia Evaluation  Patient identified by MRN, date of birth, ID band Patient awake    Reviewed: Allergy & Precautions, H&P , NPO status , Patient's Chart, lab work & pertinent test results, reviewed documented beta blocker date and time   Airway Mallampati: I  TM Distance: >3 FB Neck ROM: Full    Dental  (+) Chipped,    Pulmonary sleep apnea , COPD  Sarcoidosis    Pulmonary exam normal        Cardiovascular hypertension, Pt. on home beta blockers and Pt. on medications + CAD, + Past MI, + Cardiac Stents, + Peripheral Vascular Disease and +CHF  Normal cardiovascular exam+ Valvular Problems/Murmurs    '22 TTE - EF 55 to 60%. There is mild concentric left ventricular hypertrophy. Grade II diastolic dysfunction (pseudonormalization). Left atrial size was mild to moderately dilated. No significant valvulopathy.     Neuro/Psych  PSYCHIATRIC DISORDERS  Depression     Neuromuscular disease    GI/Hepatic Neg liver ROS, PUD,GERD  Medicated and Controlled,,  Endo/Other  diabetes, Type 2, Insulin  Dependent, Oral Hypoglycemic Agents  Class 3 obesity  Renal/GU Renal InsufficiencyRenal disease  negative genitourinary   Musculoskeletal  (+) Arthritis ,   Gout   Abdominal  (+) + obese  Peds negative pediatric ROS (+)  Hematology  (+) Blood dyscrasia, anemia   Anesthesia Other Findings Sarcoidosis  Reproductive/Obstetrics negative OB ROS                             Anesthesia Physical Anesthesia Plan  ASA: 3  Anesthesia Plan: General   Post-op Pain Management: Regional block* and Gabapentin  PO (pre-op)*   Induction: Intravenous  PONV Risk Score and Plan: 2 and Ondansetron , Midazolam  and Treatment may vary due to age or medical condition  Airway Management Planned: LMA  Additional Equipment: None  Intra-op Plan:   Post-operative Plan: Extubation in OR  Informed Consent: I have reviewed  the patients History and Physical, chart, labs and discussed the procedure including the risks, benefits and alternatives for the proposed anesthesia with the patient or authorized representative who has indicated his/her understanding and acceptance.       Plan Discussed with: CRNA and Anesthesiologist  Anesthesia Plan Comments: ( )       Anesthesia Quick Evaluation

## 2024-05-31 NOTE — Transfer of Care (Signed)
 Immediate Anesthesia Transfer of Care Note  Patient: Joshua [name]  Procedure(s) Performed: AMPUTATION BELOW KNEE (Left: Knee)  Patient Location: PACU  Anesthesia Type:General and Regional  Level of Consciousness: awake, alert , and oriented  Airway & Oxygen  Therapy: Patient Spontanous Breathing and Patient connected to face mask oxygen   Post-op Assessment: Report given to RN and Post -op Vital signs reviewed and stable  Post vital signs: Reviewed and stable  Last Vitals:  Vitals Value Taken Time  BP 182/109 05/31/24 10:45  Temp    Pulse 82 05/31/24 10:46  Resp 16 05/31/24 10:46  SpO2 99 % 05/31/24 10:46  Vitals shown include unfiled device data.  Last Pain:  Vitals:   05/31/24 0905  TempSrc:   PainSc: 5          Complications: No notable events documented.

## 2024-05-31 NOTE — Plan of Care (Signed)
  Problem: Education: Goal: Ability to describe self-care measures that may prevent or decrease complications (Diabetes Survival Skills Education) will improve Outcome: Progressing   Problem: Coping: Goal: Ability to adjust to condition or change in health will improve Outcome: Progressing   Problem: Nutritional: Goal: Maintenance of adequate nutrition will improve Outcome: Progressing

## 2024-05-31 NOTE — Op Note (Signed)
 DATE OF SERVICE: 05/31/2024  PATIENT:  Joshua [name]  68 y.o. male  PRE-OPERATIVE DIAGNOSIS:  left diabetic foot infection  POST-OPERATIVE DIAGNOSIS:  Same  PROCEDURE:   Left below knee amputation (CPT [id number])  SURGEON:  Surgeons and Role:    * Carlene Che, MD - Primary  ASSISTANT: Naida Austria, PA-C  An experienced assistant was required given the complexity of this procedure and the standard of surgical care. My assistant helped with exposure through counter tension, suctioning, ligation and retraction to better visualize the surgical field.  My assistant expedited sewing during the case by following my sutures. Wherever I use the term we in the report, my assistant actively helped me with that portion of the procedure.  ANESTHESIA:   regional and general  EBL:  BLOOD ADMINISTERED:none  DRAINS: none   LOCAL MEDICATIONS USED:  NONE  SPECIMEN:  residual left leg to pathology  COUNTS: confirmed correct.  TOURNIQUET:    Total Tourniquet Time Documented: Thigh (Left) - 11 minutes Total: Thigh (Left) - 11 minutes   PATIENT DISPOSITION:  PACU - hemodynamically stable.   Delay start of Pharmacological VTE agent (>24hrs) due to surgical blood loss or risk of bleeding: no  INDICATION FOR PROCEDURE: Joshua [name] is a 68 y.o. male with left foot diabetic ulceration. He has no options for vascular revascularization or reconstruction of the foot. After careful discussion of risks, benefits, and alternatives the patient was offered left below knee amputation. The patient understood and wished to proceed.  OPERATIVE FINDINGS: muscle at amputation margin appeared white. The muscle did fasciculate. Pulstatile flow from the tibial arteries. Myodesis performed.  DESCRIPTION OF PROCEDURE: After identification of the patient in the pre-operative holding area, the patient was transferred to the operating room. The patient was positioned supine on the operating room table.  Anesthesia was induced. The left leg was prepped and draped in standard fashion. A surgical pause was performed confirming correct patient, procedure, and operative location.  A tourniquet was placed on the left thigh. The skin of the leg was marked to plan the anterior flap 10 cm distal to the tibial tuberosity. The anterior flap measured two thirds the circumference of the calf. The posterior flap was measured to be one third of the circumference of the calf in length.   The leg was exsanguinated with an Esmarch tourniquet. The pneumatic tourniquet was inflated to 250 mm Hg. The flaps were created using pre-made marks using a 21 blade. The incision was carried down through subcutaneous tissue, fascia, and muscle anteriorly. The periosteal tissue was elevated anteriorly so that the tibia was about 3 cm shorter than the anterior skin flap.  The tibia was transected with a power saw. An anterior wedge was created with the power saw.  Then I smoothed out the rough edges with a rasp.  In a similar fashion, I cut back the fibula about two centimeters higher than the level of the tibia with an angled bone cutter.  The posterior flap was completed with an amputation knife.  The specimen was passed off the field. All visible arteries and veins were clamped and ligated using silk suture.  The tourniquet was deflated.  Hemostasis was achieved in the flaps using electrocautery and silk suture.  The stump was washed with sterile normal saline and no further active bleeding was noted.    A myodesis was created using a powered drill to create a small hole through the anterior cortex of the tibia.  A #  5 FiberWire was delivered through the osteotomy and through the aponeurosis of the gastrocnemius.  I reapproximated the anterior and posterior fascia  with interrupted stitches of 2-0 Vicryl.  This was completed along the entire length of anterior and posterior fascia until there was no more loose space in the fascial line.   The skin was then reapproximated with staples.  The stump was washed off and dried.  A Prevena VAC system was applied to the stump.  This was secured with derma tack and Ioban.  A good seal was achieved.  A stump shrinker was applied.  A stump protector was applied.  Upon completion of the case instrument and sharps counts were confirmed correct. The patient was transferred to the PACU in good condition. I was present for all portions of the procedure.  FOLLOW UP PLAN: Assuming a normal postoperative course, VVS PA will see the patient in 2-3 weeks.   Heber Little. Edgardo Goodwill, MD [facility] Phone Number: [phone] 05/31/2024 10:51 AM

## 2024-05-31 NOTE — H&P (View-Only) (Signed)
  Progress Note    05/31/2024 7:35 AM Hospital Day 5  Subjective:  resting comfortably.  No questions  afebrile  Vitals:   05/30/24 2320 05/31/24 0419  BP:  (!) 129/52  Pulse: 75 72  Resp: 18 18  Temp:  (!) 97.5 F (36.4 C)  SpO2: 99% 100%    Physical Exam: General:  no distress Lungs:  non labored   CBC    Component Value Date/Time   WBC 6.1 05/30/2024 0548   RBC 4.12 (L) 05/30/2024 0548   HGB 12.0 (L) 05/30/2024 0548   HGB 12.3 (L) 08/11/2021 0929   HCT 37.3 (L) 05/30/2024 0548   HCT 39.1 08/11/2021 0929   PLT 161 05/30/2024 0548   PLT 254 08/11/2021 0929   MCV 90.5 05/30/2024 0548   MCV 84 08/11/2021 0929   MCH 29.1 05/30/2024 0548   MCHC 32.2 05/30/2024 0548   RDW 15.1 05/30/2024 0548   RDW 15.9 (H) 08/11/2021 0929   LYMPHSABS 1.2 05/25/2024 1354   LYMPHSABS 2.0 08/11/2021 0929   MONOABS 1.3 (H) 05/25/2024 1354   EOSABS 0.0 05/25/2024 1354   EOSABS 0.2 08/11/2021 0929   BASOSABS 0.0 05/25/2024 1354   BASOSABS 0.0 08/11/2021 0929    BMET    Component Value Date/Time   NA 136 05/30/2024 0548   NA 141 10/26/2021 1044   K 3.6 05/30/2024 0548   CL 104 05/30/2024 0548   CO2 20 (L) 05/30/2024 0548   GLUCOSE 89 05/30/2024 0548   BUN 44 (H) 05/30/2024 0548   BUN 33 (H) 10/26/2021 1044   CREATININE 3.56 (H) 05/30/2024 0548   CREATININE 2.11 (H) 11/18/2022 1605   CALCIUM  8.6 (L) 05/30/2024 0548   GFRNONAA 18 (L) 05/30/2024 0548   GFRNONAA 51 (L) 11/01/2016 1012   GFRAA 27 (L) 08/23/2019 0340   GFRAA 58 (L) 11/01/2016 1012    INR    Component Value Date/Time   INR 1.1 07/10/2023 2018     Intake/Output Summary (Last 24 hours) at 05/31/2024 0735 Last data filed at 05/30/2024 1700 Gross per 24 hour  Intake 480 ml  Output --  Net 480 ml     Assessment/Plan:  68 y.o. male  with left 5th metatarsal osteomyelitis   Hospital Day 5  -plan for left BKA today with Dr. Susi Eric.  Currently no questions.   -continue npo   [name],  PA-C [facility] [phone] 05/31/2024 7:35 AM

## 2024-05-31 NOTE — Progress Notes (Signed)
  Progress Note    05/31/2024 7:35 AM Hospital Day 5  Subjective:  resting comfortably.  No questions  afebrile  Vitals:   05/30/24 2320 05/31/24 0419  BP:  (!) 129/52  Pulse: 75 72  Resp: 18 18  Temp:  (!) 97.5 F (36.4 C)  SpO2: 99% 100%    Physical Exam: General:  no distress Lungs:  non labored   CBC    Component Value Date/Time   WBC 6.1 05/30/2024 0548   RBC 4.12 (L) 05/30/2024 0548   HGB 12.0 (L) 05/30/2024 0548   HGB 12.3 (L) 08/11/2021 0929   HCT 37.3 (L) 05/30/2024 0548   HCT 39.1 08/11/2021 0929   PLT 161 05/30/2024 0548   PLT 254 08/11/2021 0929   MCV 90.5 05/30/2024 0548   MCV 84 08/11/2021 0929   MCH 29.1 05/30/2024 0548   MCHC 32.2 05/30/2024 0548   RDW 15.1 05/30/2024 0548   RDW 15.9 (H) 08/11/2021 0929   LYMPHSABS 1.2 05/25/2024 1354   LYMPHSABS 2.0 08/11/2021 0929   MONOABS 1.3 (H) 05/25/2024 1354   EOSABS 0.0 05/25/2024 1354   EOSABS 0.2 08/11/2021 0929   BASOSABS 0.0 05/25/2024 1354   BASOSABS 0.0 08/11/2021 0929    BMET    Component Value Date/Time   NA 136 05/30/2024 0548   NA 141 10/26/2021 1044   K 3.6 05/30/2024 0548   CL 104 05/30/2024 0548   CO2 20 (L) 05/30/2024 0548   GLUCOSE 89 05/30/2024 0548   BUN 44 (H) 05/30/2024 0548   BUN 33 (H) 10/26/2021 1044   CREATININE 3.56 (H) 05/30/2024 0548   CREATININE 2.11 (H) 11/18/2022 1605   CALCIUM  8.6 (L) 05/30/2024 0548   GFRNONAA 18 (L) 05/30/2024 0548   GFRNONAA 51 (L) 11/01/2016 1012   GFRAA 27 (L) 08/23/2019 0340   GFRAA 58 (L) 11/01/2016 1012    INR    Component Value Date/Time   INR 1.1 07/10/2023 2018     Intake/Output Summary (Last 24 hours) at 05/31/2024 0735 Last data filed at 05/30/2024 1700 Gross per 24 hour  Intake 480 ml  Output --  Net 480 ml     Assessment/Plan:  68 y.o. male  with left 5th metatarsal osteomyelitis   Hospital Day 5  -plan for left BKA today with Dr. Susi Eric.  Currently no questions.   -continue npo   [name],  PA-C [facility] [phone] 05/31/2024 7:35 AM

## 2024-05-31 NOTE — Anesthesia Procedure Notes (Signed)
 Anesthesia Regional Block: Popliteal block   Pre-Anesthetic Checklist: , timeout performed,  Correct Patient, Correct Site, Correct Laterality,  Correct Procedure, Correct Position, site marked,  Risks and benefits discussed,  Surgical consent,  Pre-op evaluation,  At surgeon's request and post-op pain management  Laterality: Left  Prep: chloraprep       Needles:  Injection technique: Single-shot  Needle Type: Stimiplex     Needle Length: 9cm  Needle Gauge: 21     Additional Needles:   Procedures:,,,, ultrasound used (permanent image in chart),,    Narrative:  Start time: 05/31/2024 9:06 AM End time: 05/31/2024 9:11 AM Injection made incrementally with aspirations every 5 mL.  Performed by: Personally  Anesthesiologist: Earvin Goldberg, MD

## 2024-06-01 ENCOUNTER — Encounter (HOSPITAL_COMMUNITY)
Admission: EM | Disposition: A | Discharge status: Skilled Nursing Facility | Payer: Self-pay | Source: Ambulatory Visit | Attending: Internal Medicine

## 2024-06-01 ENCOUNTER — Encounter (HOSPITAL_COMMUNITY): Payer: Self-pay | Admitting: Vascular Surgery

## 2024-06-01 DIAGNOSIS — M86672 Other chronic osteomyelitis, left ankle and foot: Secondary | ICD-10-CM | POA: Diagnosis not present

## 2024-06-01 DIAGNOSIS — Z89512 Acquired absence of left leg below knee: Secondary | ICD-10-CM

## 2024-06-01 DIAGNOSIS — G8918 Other acute postprocedural pain: Secondary | ICD-10-CM

## 2024-06-01 DIAGNOSIS — L089 Local infection of the skin and subcutaneous tissue, unspecified: Secondary | ICD-10-CM | POA: Diagnosis not present

## 2024-06-01 DIAGNOSIS — E11628 Type 2 diabetes mellitus with other skin complications: Secondary | ICD-10-CM | POA: Diagnosis not present

## 2024-06-01 LAB — GLUCOSE, CAPILLARY
Glucose-Capillary: 157 mg/dL — ABNORMAL HIGH (ref 70–99)
Glucose-Capillary: 179 mg/dL — ABNORMAL HIGH (ref 70–99)
Glucose-Capillary: 167 mg/dL — ABNORMAL HIGH (ref 70–99)
Glucose-Capillary: 186 mg/dL — ABNORMAL HIGH (ref 70–99)

## 2024-06-01 LAB — SURGICAL PATHOLOGY

## 2024-06-01 LAB — CREATININE, SERUM
Creatinine, Ser: 3.03 mg/dL — ABNORMAL HIGH (ref 0.61–1.24)
GFR, Estimated: 22 mL/min — ABNORMAL LOW (ref >60)

## 2024-06-01 SURGERY — AMPUTATION BELOW KNEE
Anesthesia: Choice | Site: Knee | Laterality: Left

## 2024-06-01 NOTE — PMR Pre-admission (Shared)
 PMR Admission Coordinator Pre-Admission Assessment  Patient: Joshua [name] is an 68 y.o., male MRN: [id number] DOB: Aug 10, 1956 Height: 5' 9 (175.3 cm) Weight: 110.2 kg              Insurance Information HMO: yes    PPO:      PCP:      IPA:      80/20:      OTHER:  PRIMARY: U[employer] Medicare      Policy#: ***      Subscriber:  CM Name: ***      Phone#: ***     Fax#: *** Pre-Cert#: ***      Employer: *** Benefits:  Phone #: ***     Name: *** Eff. Date: ***     Deduct: ***      Out of Pocket Max: ***      Life Max: ***  CIR: ***      SNF: *** Outpatient: ***     Co-Pay: *** Home Health: ***      Co-Pay: *** DME: ***     Co-Pay: *** Providers: *** SECONDARY:       Policy#:       Phone#:   Financial Counselor:       Phone#:   The "Data Collection Information Summary" for patients in Inpatient Rehabilitation Facilities with attached "Privacy Act Statement-Health Care Records" was provided and verbally reviewed with: Patient  Emergency Contact Information Contact Information     Name Relation Home Work Mobile   Meulemans,Chris Son [phone]  [phone]      Other Contacts   None on File    Current Medical History  Patient Admitting Diagnosis: L BKA   History of Present Illness: ***   Glasgow Coma Scale Score: 15  Patient[facility] medical record from [facility] has been reviewed by the rehabilitation admission coordinator and physician.  Past Medical History  Past Medical History:  Diagnosis Date   Acute coronary syndrome (HCC)    Anemia    B12 deficiency    Chest pain    CHF (congestive heart failure) (HCC)    Chronic combined systolic and diastolic CHF (congestive heart failure) (HCC)    a. 07/2015 Echo: EF 45-50%, mod LVH, mid apical/antsept AK, Gr 1 DD.   Chronic low back pain    CKD (chronic kidney disease), stage III (HCC)    Constipation    COPD (chronic obstructive pulmonary disease) (HCC)    Coronary artery disease    a. 2012 s/p MI/PCI: s/p DES  to mid/dist RCA and overlapping DES to LAD;  b. 07/2015 Cath: LAD 10% ISR, D1 40(jailed), LCX 53m, OM2 30, OM3 30, RCA 31m ISR, 10d ISR; c. 07/2016 NSTEMI/PCI: LAD 85ost/p/m (3.5x20 Synergy DES overlapping prior stent), D1 40, LCX 82m, OM2 95 (2.75x16 Synergy DES), OM3 30, RCA 50m, 10d.   Depression    Diabetic gastroparesis (HCC)    Diabetic polyneuropathy (HCC)    DKA (diabetic ketoacidoses) 03/14/2018   GERD (gastroesophageal reflux disease)    Gout    Heart failure (HCC)    Heart murmur    History of MI (myocardial infarction)    Hyperlipemia    Hypertension    Hypertensive heart disease    Hypokalemia    Ischemic cardiomyopathy    a. 07/2015 Echo: EF 45-50%, mod LVH, mid-apicalanteroseptal AK, Gr1 DD.   Joint pain    Kidney problem    Morbid obesity (HCC)    OSA on CPAP  Osteoarthritis    Osteomyelitis (HCC)    a. 07/2016 MTP joint of R great toe s/p transmetatarsal amputation.   Other fatigue    Polyneuropathy in diabetes(357.2)    Pulmonary sarcoidosis (HCC)    problems w/it years ago; no problems anymore (07/03/2014)   Rectal bleeding    Shortness of breath    Shortness of breath on exertion    Sleep apnea    Stomach ulcer    Swallowing difficulty    Type II diabetes mellitus (HCC)     Has the patient had major surgery during 100 days prior to admission? Yes  Family History  family history includes Diabetes in his maternal grandmother; Heart attack in his maternal aunt; Hypertension in his maternal grandmother; Sudden death in his mother and another family member.   Current Medications   Current Facility-Administered Medications:    acetaminophen  (TYLENOL ) tablet 1,000 mg, 1,000 mg, Oral, Q6H PRN, Eveland, Matthew, PA-C, 1,000 mg at 05/29/24 0845   Ampicillin -Sulbactam (UNASYN ) 3 g in sodium chloride  0.9 % 100 mL IVPB, 3 g, Intravenous, Q12H, Eveland, Matthew, PA-C, Last Rate: 200 mL/hr at 06/01/24 0122, 3 g at 06/01/24 0122   aspirin  chewable tablet 81 mg, 81  mg, Oral, Daily, Eveland, Matthew, PA-C, 81 mg at 06/01/24 1610   cyclobenzaprine  (FLEXERIL ) tablet 5 mg, 5 mg, Oral, TID PRN, Eveland, Matthew, PA-C, 5 mg at 05/31/24 2131   doxycycline  (VIBRA -TABS) tablet 100 mg, 100 mg, Oral, Q12H, Eveland, Matthew, PA-C, 100 mg at 06/01/24 9604   DULoxetine  (CYMBALTA ) DR capsule 20 mg, 20 mg, Oral, BID, Eveland, Matthew, PA-C, 20 mg at 06/01/24 0702   enoxaparin  (LOVENOX ) injection 30 mg, 30 mg, Subcutaneous, Q24H, Eveland, Matthew, PA-C, 30 mg at 06/01/24 5409   famotidine  (PEPCID ) tablet 10 mg, 10 mg, Oral, QHS, Eveland, Matthew, PA-C, 10 mg at 05/31/24 2128   furosemide  (LASIX ) tablet 40 mg, 40 mg, Oral, BID, Eveland, Matthew, PA-C, 40 mg at 06/01/24 8119   gabapentin  (NEURONTIN ) capsule 100 mg, 100 mg, Oral, QHS, Eveland, Matthew, PA-C, 100 mg at 05/31/24 2128   insulin  aspart (novoLOG ) injection 0-15 Units, 0-15 Units, Subcutaneous, TID WC, Eveland, Matthew, PA-C, 3 Units at 06/01/24 1239   insulin  glargine-yfgn (SEMGLEE ) injection 25 Units, 25 Units, Subcutaneous, QHS, Eveland, Matthew, PA-C, 25 Units at 05/31/24 2130   isosorbide  mononitrate (IMDUR ) 24 hr tablet 120 mg, 120 mg, Oral, Daily, Eveland, Matthew, PA-C, 120 mg at 06/01/24 1478   latanoprost  (XALATAN ) 0.005 % ophthalmic solution 1 drop, 1 drop, Both Eyes, QHS, Eveland, Matthew, PA-C, 1 drop at 05/31/24 2129   metoprolol  succinate (TOPROL -XL) 24 hr tablet 100 mg, 100 mg, Oral, Daily, Eveland, Matthew, PA-C, 100 mg at 06/01/24 2956   morphine  (PF) 4 MG/ML injection 4 mg, 4 mg, Intravenous, Q2H PRN, Eveland, Matthew, PA-C   Oral care mouth rinse, 15 mL, Mouth Rinse, PRN, Eveland, Matthew, PA-C   oxyCODONE  (Oxy IR/ROXICODONE ) immediate release tablet 5-10 mg, 5-10 mg, Oral, Q4H PRN, Eveland, Matthew, PA-C, 10 mg at 06/01/24 1239   pantoprazole  (PROTONIX ) EC tablet 40 mg, 40 mg, Oral, BID, Eveland, Matthew, PA-C, 40 mg at 06/01/24 2130   rosuvastatin  (CRESTOR ) tablet 10 mg, 10 mg, Oral, Daily,  Eveland, Matthew, PA-C, 10 mg at 06/01/24 8657   tamsulosin  (FLOMAX ) capsule 0.4 mg, 0.4 mg, Oral, QPC supper, Cordie Deters, PA-C, 0.4 mg at 05/31/24 1710  Patients Current Diet:  Diet Order             Diet regular Fluid consistency:  Thin  Diet effective now                   Precautions / Restrictions Precautions Precautions: Fall Precaution/Restrictions Comments: R transmet amputation; limb guard on when OOB Restrictions Weight Bearing Restrictions Per Provider Order: Yes LLE Weight Bearing Per Provider Order: Non weight bearing   Has the patient had 2 or more falls or a fall with injury in the past year?Yes  Prior Activity Level Community (5-7x/wk): pt. active in the community PTA  Prior Functional Level Prior Function Prior Level of Function : Independent/Modified Independent Mobility Comments: using SPC for past year due to balance deficits  Self Care: Did the patient need help bathing, dressing, using the toilet or eating?  Independent  Indoor Mobility: Did the patient need assistance with walking from room to room (with or without device)? Independent  Stairs: Did the patient need assistance with internal or external stairs (with or without device)? Independent  Functional Cognition: Did the patient need help planning regular tasks such as shopping or remembering to take medications? Independent  Patient Information Are you of Hispanic, Latino/a,or Spanish origin?: A. No, not of Hispanic, Latino/a, or Spanish origin What is your race?: B. Black or African American Do you need or want an interpreter to communicate with a doctor or health care staff?: 0. No  Patient's Response To:  Health Literacy and Transportation Is the patient able to respond to health literacy and transportation needs?: Yes Health Literacy - How often do you need to have someone help you when you read instructions, pamphlets, or other written material from your doctor or pharmacy?:  Never In the past 12 months, has lack of transportation kept you from medical appointments or from getting medications?: No In the past 12 months, has lack of transportation kept you from meetings, work, or from getting things needed for daily living?: No  Home Assistive Devices / Equipment Home Equipment: Agricultural consultant (2 wheels), [employer] - single point  Prior Device Use: Indicate devices/aids used by the patient prior to current illness, exacerbation or injury? cane  Current Functional Level Cognition  Orientation Level: Oriented X4    Extremity Assessment (includes Sensation/Coordination)  Upper Extremity Assessment: Defer to OT evaluation  Lower Extremity Assessment: Generalized weakness    ADLs       Mobility  Overal bed mobility: Needs Assistance Bed Mobility: Rolling, Supine to Sit Rolling: Supervision Supine to sit: Supervision General bed mobility comments: HOB elevated and use of bedrails    Transfers  Overall transfer level: Needs assistance Equipment used: Rolling walker (2 wheels) Transfers: Sit to/from Stand, Bed to chair/wheelchair/BSC Sit to Stand: Min assist Bed to/from chair/wheelchair/BSC transfer type:: Stand pivot Stand pivot transfers: Min assist    Ambulation / Gait / Stairs / Engineer, drilling / Balance Dynamic Sitting Balance Sitting balance - Comments: able to maintain static/dynamic sitting balance without UE support and supervision Balance Overall balance assessment: Needs assistance Sitting-balance support: No upper extremity supported, Feet supported Sitting balance-Leahy Scale: Fair Sitting balance - Comments: able to maintain static/dynamic sitting balance without UE support and supervision Standing balance support: Bilateral upper extremity supported, Reliant on assistive device for balance (RW) Standing balance-Leahy Scale: Poor Standing balance comment: CGA for static standing balance and min A for dynamic standing  balance    Special needs/care consideration Skin surgical incision     Previous Home Environment (from acute therapy documentation) Living Arrangements: Children Available Help at Discharge: Family,  Available PRN/intermittently Type of Home: Mobile home Home Layout: One level Home Access: Stairs to enter Entrance Stairs-Rails: Right, Left, Can reach both Entrance Stairs-Number of Steps: 4 Bathroom Shower/Tub: Associate Professor: Yes Home Care Services: No Additional Comments: pt reports living with his son and family  Discharge Living Setting Plans for Discharge Living Setting: Patient's home Type of Home at Discharge: Mobile home Discharge Home Layout: One level Discharge Home Access: Stairs to enter Entrance Stairs-Rails: Right, Left, Can reach both Entrance Stairs-Number of Steps: 4 Discharge Bathroom Shower/Tub: Tub/shower unit Discharge Bathroom Toilet: Standard Discharge Bathroom Accessibility: Yes How Accessible: Accessible via wheelchair, Accessible via walker Does the patient have any problems obtaining your medications?: No  Social/Family/Support Systems Patient Roles: Other (Comment) Contact Information: Reita Carbon Anticipated Caregiver: [phone] Anticipated Caregiver's Contact Information: Son and his fiance are home most of the time, just had a baby, can mostly just provide supervision Ability/Limitations of Caregiver: 24/7 Discharge Plan Discussed with Primary Caregiver: Yes Is Caregiver In Agreement with Plan?: Yes Does Caregiver/Family have Issues with Lodging/Transportation while Pt is in Rehab?: No   Goals Patient/Family Goal for Rehab: PT/OT mod I Expected length of stay: 7-10 days Pt/Family Agrees to Admission and willing to participate: Yes Program Orientation Provided & Reviewed with Pt/Caregiver Including Roles  & Responsibilities: Yes   Decrease burden of Care through IP rehab admission: Not  anticipated   Possible need for SNF placement upon discharge:Not anticipated.   Patient Condition: {PATIENT'S CONDITION:22832}  Preadmission Screen Completed By:  Dorena Gander, CCC-SLP, 06/01/2024 12:53 PM ______________________________________________________________________   Discussed status with Dr. Aaron Aason***at *** and received approval for admission today.  Admission Coordinator:  Dorena Gander, time***/Date***

## 2024-06-01 NOTE — Anesthesia Postprocedure Evaluation (Signed)
 Anesthesia Post Note  Patient: Joshua [name]  Procedure(s) Performed: AMPUTATION BELOW KNEE (Left: Knee)     Patient location during evaluation: PACU Anesthesia Type: General Level of consciousness: awake and alert Pain management: pain level controlled Vital Signs Assessment: post-procedure vital signs reviewed and stable Respiratory status: spontaneous breathing, nonlabored ventilation and respiratory function stable Cardiovascular status: blood pressure returned to baseline and stable Postop Assessment: no apparent nausea or vomiting Anesthetic complications: no   No notable events documented.  Last Vitals:  Vitals:   05/31/24 2013 06/01/24 0525  BP: 112/69 124/71  Pulse: 94 78  Resp: 19 18  Temp: 37.3 C 37 C  SpO2: 100% 98%    Last Pain:  Vitals:   06/01/24 0742  TempSrc:   PainSc: 7                  Earvin Goldberg

## 2024-06-01 NOTE — Progress Notes (Signed)
  Inpatient Rehab Admissions Coordinator :  Per therapy recommendations, patient was screened for CIR candidacy by Ottie Glazier RN MSN.  At this time patient appears to be a potential candidate for CIR. I will place a rehab consult per protocol for full assessment. Please call me with any questions.  Ottie Glazier RN MSN Admissions Coordinator [phone]

## 2024-06-01 NOTE — Progress Notes (Signed)
 [facility] PROGRESS NOTE    Progress Note  [name]  WUJ:[id number] DOB: 08/31/1956 DOA: 05/25/2024 PCP: Ilsa Maltese, PA    Brief Narrative:   Joshua [name] is an 68 y.o. male past medical history of CAD with multiple PCI's, HFpEF, essential hypertension diabetes mellitus type 2 with peripheral neuropathy status post right metatarsal amputation 2017, with 40 left lower extremity healing wound, chronic kidney see stage IV, obesity, he relates he has been left lower extremity daily for years he follows up with ID and podiatry multiple rounds of antibiotics.  He has been offered right lower extremity amputation which he has refused.  Presented on the day of admission to the podiatrist office was sent to the ED with left foot diabetic ulcer with osteomyelitis. Assessment/Plan:   Left foot ulcer with osteomyelitis/ Diabetic foot infection [facility]): Podiatry was consulted recommended vascular surgery consult and orthopedic. Continue empirically on antibiotics,  doxycycline  and Unasyn . S/p left BKA on 05/31/2024. Further management per vascular surgery. PT evaluated the patient will need inpatient rehab  Acute kidney injury on chronic disease stage IV: Nephrology was consulted recommended to restart Lasix  and transition vancomycin  to Doxy. They have signed off. Avoid hypotension.  Metabolic acidosis: Likely due to chronic renal disease, now on sodium bicarb tabs. Now resolved.  Hypokalemia: Repleted now improved.  Anemia of chronic renal disease  Globin has remained relatively stable.  Diabetes mellitus type 2: Continue sliding scale insulin .  Essential hypertension: ARB has been discontinued, continue metoprolol , Lasix  and Imdur    DVT prophylaxis: Lovenox  Family Communication:none Status is: Inpatient Remains inpatient appropriate because: Diabetic foot ulcer infection    Code Status:     Code Status Orders  (From admission, onward)            Start     Ordered   05/25/24 1738  Full code  Continuous       Question:  By:  Answer:  Consent: discussion documented in EHR   05/25/24 1738           Code Status History     Date Active Date Inactive Code Status Order ID Comments User Context   07/10/2023 2224 07/13/2023 2313 Full Code [id number]  Roxana Copier, DO ED   11/26/2022 0242 12/10/2022 0140 Full Code [id number]  Roxana Copier, DO ED   11/06/2022 0106 11/16/2022 2122 Full Code [id number]  Kenny Peals, MD ED   09/25/2020 0128 09/28/2020 1948 Full Code [id number]  Chotiner, Pauline Bos, MD ED   08/16/2019 2048 08/23/2019 1620 Full Code [id number]  Veronica Gordon, MD Inpatient   04/06/2019 2342 04/10/2019 1818 Full Code [id number]  Carles Cheadle, MD Inpatient   03/15/2018 0011 03/22/2018 1936 Full Code [id number]  Graciela Lava, MD ED   05/19/2017 1908 05/23/2017 1549 Full Code [id number]  Walton Guppy, MD ED   10/11/2016 1645 10/14/2016 0025 Full Code [id number]  Abel Hoe, PA-C ED   08/03/2016 1807 08/13/2016 1323 Full Code [id number]  Sterling Eisenmenger, PA-C Inpatient   08/03/2016 1807 08/03/2016 1807 Full Code [id number]  Sterling Eisenmenger, PA-C Inpatient   07/18/2016 2307 08/03/2016 1802 Full Code [id number]  Dorsey Gault, MD Inpatient   07/30/2015 1139 07/31/2015 2018 Full Code [id number]  Odie Benne, MD Inpatient   07/29/2015 0705 07/30/2015 1139 Full Code [id number]  Hazle Lites, MD Inpatient   07/03/2014 1222 07/04/2014 1349 Full Code [id number]  Arty Binning, MD Inpatient   10/29/2013 0258 11/02/2013  1958 Full Code [id number]  Tyra Galley, MD Inpatient         IV Access:   Peripheral IV   Procedures and diagnostic studies:   No results found.   Medical Consultants:   None.   Subjective:    ARJAN STROHM no complaints this morning.  Objective:    Vitals:   05/31/24 1119 05/31/24 2013 06/01/24 0525 06/01/24 0822  BP: (!) 151/72 112/69 124/71 131/78  Pulse: 81 94 78 81  Resp: 20  19 18 18   Temp: 98.2 F (36.8 C) 99.1 F (37.3 C) 98.6 F (37 C) 98.2 F (36.8 C)  TempSrc:   Oral   SpO2: 100% 100% 98% 99%  Weight:      Height:       SpO2: 99 % O2 Flow Rate (L/min): 2 L/min FiO2 (%): 32 %   Intake/Output Summary (Last 24 hours) at 06/01/2024 0909 Last data filed at 06/01/2024 0500 Gross per 24 hour  Intake 400 ml  Output 1400 ml  Net -1000 ml   Filed Weights   05/25/24 1332 05/31/24 0803  Weight: 109.3 kg 110.2 kg    Exam: General exam: In no acute distress. Respiratory system: Good air movement and clear to auscultation. Cardiovascular system: S1 & S2 heard, RRR. No JVD. Gastrointestinal system: Abdomen is nondistended, soft and nontender.  Extremities: Wound VAC in place Skin: No rashes, lesions or ulcers Psychiatry: Judgement and insight appear normal. Mood & affect appropriate. Data Reviewed:    Labs: Basic Metabolic Panel: Recent Labs  Lab 05/26/24 1034 05/27/24 0858 05/28/24 5409 05/29/24 0604 05/30/24 0548 05/31/24 0527 06/01/24 0554  NA  --  138 134* 137 136 137  --   K  --  3.7 2.9* 3.8 3.6 3.9  --   CL  --  105 103 105 104 104  --   CO2  --  18* 22 22 20* 21*  --   GLUCOSE  --  143* 165* 122* 89 110*  --   BUN  --  70* 61* 53* 44* 42*  --   CREATININE  --  5.26* 4.82* 4.11* 3.56* 3.19* 3.03*  CALCIUM   --  9.0 8.5* 8.9 8.6* 8.5*  --   MG 2.1  --  2.1  --   --   --   --   PHOS  --   --  5.0*  --   --   --   --    GFR Estimated Creatinine Clearance: 29.7 mL/min (A) (by C-G formula based on SCr of 3.03 mg/dL (H)). Liver Function Tests: Recent Labs  Lab 05/25/24 1354  AST 36  ALT 30  ALKPHOS 123  BILITOT 0.4  PROT 6.7  ALBUMIN 3.0*   No results for input(s): LIPASE, AMYLASE in the last 168 hours. No results for input(s): AMMONIA in the last 168 hours. Coagulation profile No results for input(s): INR, PROTIME in the last 168 hours. COVID-19 Labs  No results for input(s): DDIMER, FERRITIN, LDH, CRP  in the last 72 hours.  Lab Results  Component Value Date   SARSCOV2NAA NEGATIVE 07/10/2023   SARSCOV2NAA NEGATIVE 09/25/2020   SARSCOV2NAA NEGATIVE 08/16/2019    CBC: Recent Labs  Lab 05/25/24 1354 05/25/24 2026 05/26/24 0647 05/27/24 0858 05/29/24 0604 05/30/24 0548 05/31/24 0527  WBC 8.7   < > 8.4 7.5 7.5 6.1 6.1  NEUTROABS 6.2  --   --   --   --   --   --   HGB  12.8*   < > 13.0 13.3 12.8* 12.0* 11.8*  HCT 41.1   < > 40.3 41.9 39.4 37.3* 37.4*  MCV 91.1   < > 89.0 91.7 89.5 90.5 91.7  PLT 176   < > 156 129* 178 161 151   < > = values in this interval not displayed.   Cardiac Enzymes: No results for input(s): CKTOTAL, CKMB, CKMBINDEX, TROPONINI in the last 168 hours. BNP (last 3 results) No results for input(s): PROBNP in the last 8760 hours. CBG: Recent Labs  Lab 05/31/24 0810 05/31/24 1051 05/31/24 1558 05/31/24 2047 06/01/24 0636  GLUCAP 121* 122* 237* 207* 157*   D-Dimer: No results for input(s): DDIMER in the last 72 hours. Hgb A1c: No results for input(s): HGBA1C in the last 72 hours. Lipid Profile: No results for input(s): CHOL, HDL, LDLCALC, TRIG, CHOLHDL, LDLDIRECT in the last 72 hours. Thyroid  function studies: No results for input(s): TSH, T4TOTAL, T3FREE, THYROIDAB in the last 72 hours.  Invalid input(s): FREET3 Anemia work up: No results for input(s): VITAMINB12, FOLATE, FERRITIN, TIBC, IRON, RETICCTPCT in the last 72 hours. Sepsis Labs: Recent Labs  Lab 05/25/24 1402 05/25/24 2026 05/27/24 0858 05/29/24 0604 05/30/24 0548 05/31/24 0527  WBC  --    < > 7.5 7.5 6.1 6.1  LATICACIDVEN 1.4  --   --   --   --   --    < > = values in this interval not displayed.   Microbiology Recent Results (from the past 240 hours)  MRSA Next Gen by PCR, Nasal     Status: None   Collection Time: 05/30/24  8:16 AM   Specimen: Nasal Mucosa; Nasal Swab  Result Value Ref Range Status   MRSA by PCR Next Gen  NOT DETECTED NOT DETECTED Final    Comment: (NOTE) The GeneXpert MRSA Assay (FDA approved for NASAL specimens only), is one component of a comprehensive MRSA colonization surveillance program. It is not intended to diagnose MRSA infection nor to guide or monitor treatment for MRSA infections. Test performance is not FDA approved in patients less than 69 years old. Performed at [facility], [address] [place], [place] [address]      Medications:    aspirin   81 mg Oral Daily   doxycycline   100 mg Oral Q12H   DULoxetine   20 mg Oral BID   enoxaparin  (LOVENOX ) injection  30 mg Subcutaneous Q24H   famotidine   10 mg Oral QHS   furosemide   40 mg Oral BID   gabapentin   100 mg Oral QHS   insulin  aspart  0-15 Units Subcutaneous TID WC   insulin  glargine-yfgn  25 Units Subcutaneous QHS   isosorbide  mononitrate  120 mg Oral Daily   latanoprost   1 drop Both Eyes QHS   metoprolol  succinate  100 mg Oral Daily   pantoprazole   40 mg Oral BID   rosuvastatin   10 mg Oral Daily   tamsulosin   0.4 mg Oral QPC supper   Continuous Infusions:  ampicillin -sulbactam (UNASYN ) IV 3 g (06/01/24 0122)      LOS: 7 days   Macdonald Savoy  [facility]  06/01/2024, 9:09 AM

## 2024-06-01 NOTE — Evaluation (Addendum)
 Physical Therapy Evaluation Patient Details Name: Joshua [name] MRN: [id number] DOB: 05-29-1956 Today's Date: 06/01/2024  History of Present Illness  Pt is a 31 M s/p L BKA on 05/31/24 resulting from diabetic foot infection. PMH includes CAD with multiple PCI's, HFpEF, essential hypertension diabetes mellitus type 2 with peripheral neuropathy, status post R metatarsal amputation 2017, with 40 left lower extremity healing wound, chronic kidney see stage IV, obesity  Clinical Impression  Received pt semi-reclined in bed with no complaints of pain. Pt reports living with son and daughter in law in mobile home with 4 STE and 2 handrails. Pt reports he will have intermittent assist at home, but son/daughter work and have young kids to care for. Pt performed bed mobility with supervision and transfers with RW and CGA/min A. Provided limb loss education on importance of wearing limb guard, maintaining knee extension, wearing shoe on contralateral limb, and avoiding excessive hopping. Pt would benefit from intensive rehab >3 hours/day to maximize strength and independence prior to discharging home.         If plan is discharge home, recommend the following: A little help with walking and/or transfers;A little help with bathing/dressing/bathroom;Assistance with cooking/housework;Assist for transportation;Help with stairs or ramp for entrance   Can travel by private vehicle        Equipment Recommendations Wheelchair (measurements PT)  Recommendations for Other Services       Functional Status Assessment Patient has had a recent decline in their functional status and demonstrates the ability to make significant improvements in function in a reasonable and predictable amount of time.     Precautions / Restrictions Precautions Precautions: Fall Precaution/Restrictions Comments: R transmet amputation; limb guard on when OOB Restrictions Weight Bearing Restrictions Per Provider Order: Yes LLE  Weight Bearing Per Provider Order: Non weight bearing      Mobility  Bed Mobility Overal bed mobility: Needs Assistance Bed Mobility: Rolling, Supine to Sit Rolling: Supervision   Supine to sit: Supervision     General bed mobility comments: HOB elevated and use of bedrails Patient Response: Cooperative  Transfers Overall transfer level: Needs assistance Equipment used: Rolling walker (2 wheels) Transfers: Sit to/from Stand, Bed to chair/wheelchair/BSC Sit to Stand: Min assist Stand pivot transfers: Min assist              Ambulation/Gait                  Stairs            Wheelchair Mobility     Tilt Bed Tilt Bed Patient Response: Cooperative  Modified Rankin (Stroke Patients Only)       Balance Overall balance assessment: Needs assistance Sitting-balance support: No upper extremity supported, Feet supported Sitting balance-Leahy Scale: Fair Sitting balance - Comments: able to maintain static/dynamic sitting balance without UE support and supervision   Standing balance support: Bilateral upper extremity supported, Reliant on assistive device for balance (RW) Standing balance-Leahy Scale: Poor Standing balance comment: CGA for static standing balance and min A for dynamic standing balance                             Pertinent Vitals/Pain Pain Assessment Pain Assessment: No/denies pain    Home Living Family/patient expects to be discharged to:: Private residence Living Arrangements: Children Available Help at Discharge: Family;Available PRN/intermittently Type of Home: Mobile home Home Access: Stairs to enter Entrance Stairs-Rails: Right;Left;Can reach both Entrance Stairs-Number of Steps: 4  Home Layout: One level Home Equipment: Agricultural consultant (2 wheels);Cane - single point Additional Comments: pt reports living with his son and family    Prior Function Prior Level of Function : Independent/Modified Independent              Mobility Comments: using SPC for past year due to balance deficits       Extremity/Trunk Assessment   Upper Extremity Assessment Upper Extremity Assessment: Defer to OT evaluation    Lower Extremity Assessment Lower Extremity Assessment: Generalized weakness    Cervical / Trunk Assessment Cervical / Trunk Assessment: Normal  Communication   Communication Communication: No apparent difficulties    Cognition Arousal: Alert Behavior During Therapy: WFL for tasks assessed/performed   PT - Cognitive impairments: No apparent impairments                       PT - Cognition Comments: motivated Following commands: Intact       Cueing       General Comments      Exercises     Assessment/Plan    PT Assessment Patient needs continued PT services  PT Problem List Decreased strength;Decreased range of motion;Decreased activity tolerance;Decreased balance;Decreased mobility;Decreased coordination       PT Treatment Interventions DME instruction;Gait training;Stair training;Functional mobility training;Therapeutic activities;Therapeutic exercise;Balance training;Neuromuscular re-education;Patient/family education;Wheelchair mobility training    PT Goals (Current goals can be found in the Care Plan section)  Acute Rehab PT Goals Patient Stated Goal: to be as independent as possible PT Goal Formulation: With patient Time For Goal Achievement: 06/15/24 Potential to Achieve Goals: Good    Frequency Min 2X/week     Co-evaluation               AM-PAC PT 6 Clicks Mobility  Outcome Measure Help needed turning from your back to your side while in a flat bed without using bedrails?: A Little Help needed moving from lying on your back to sitting on the side of a flat bed without using bedrails?: A Little Help needed moving to and from a bed to a chair (including a wheelchair)?: A Little Help needed standing up from a chair using your arms (e.g.,  wheelchair or bedside chair)?: A Little Help needed to walk in hospital room?: A Lot Help needed climbing 3-5 steps with a railing? : Total 6 Click Score: 15    End of Session Equipment Utilized During Treatment: Gait belt;Other (comment) (L limb guard) Activity Tolerance: Patient tolerated treatment well;No increased pain Patient left: in chair;with call bell/phone within reach;with chair alarm set Nurse Communication: Mobility status;Precautions PT Visit Diagnosis: Unsteadiness on feet (R26.81);Other abnormalities of gait and mobility (R26.89);Muscle weakness (generalized) (M62.81)    Time: 1027-2536 PT Time Calculation (min) (ACUTE ONLY): 24 min   Charges:   PT Evaluation $PT Eval Moderate Complexity: 1 Mod PT Treatments $Therapeutic Activity: 8-22 mins PT General Charges $$ ACUTE PT VISIT: 1 Visit         Nena Bank PT, DPT Nicolas Barren Zaunegger 06/01/2024, 9:11 AM

## 2024-06-01 NOTE — Evaluation (Signed)
 Occupational Therapy Evaluation Patient Details Name: Joshua [name] MRN: [id number] DOB: 1956-02-12 Today's Date: 06/01/2024   History of Present Illness   Pt is a 68 M s/p L BKA on 05/31/24 resulting from diabetic foot infection. PMH includes CAD with multiple PCI's, HFpEF, essential hypertension diabetes mellitus type 2 with peripheral neuropathy, status post R metatarsal amputation 2017, with 40 left lower extremity healing wound, chronic kidney see stage IV, obesity     Clinical Impressions At baseline, pt is Independent with ADLs and IADLs and performs functional mobility Mod I with a SPC. Pt now presents with decreased activity tolerance, decreased balance, decreased B hand strength, decreased B fine motor coordination, decreased knowledge of precautions, decreased knowledge of AE/DME, pain affecting functional level, and decreased safety and independence with functional tasks. Pt currently demonstrates ability to complete UB ADLs with Set up to Supervision, LB ADLs with Set up/Supervision to Max assist, and functional transfers with a RW with Min assist. Pt participated well in session, is highly motivated to return to PLOF, and has good family support. Pt will benefit from acute skilled OT services to address deficits outlined below and increase safety and independence with functional tasks. Post acute discharge, pt will benefit from intensive inpatient skilled rehab services > 3 hours per day to maximize rehab potential.      If plan is discharge home, recommend the following:   A little help with walking and/or transfers;A lot of help with bathing/dressing/bathroom;Assistance with cooking/housework;Assist for transportation;Help with stairs or ramp for entrance     Functional Status Assessment   Patient has had a recent decline in their functional status and demonstrates the ability to make significant improvements in function in a reasonable and predictable amount of time.      Equipment Recommendations   Tub/shower bench;BSC/3in1     Recommendations for Other Services   Rehab consult     Precautions/Restrictions   Precautions Precautions: Fall Precaution/Restrictions Comments: R transmet amputation; limb guard on when OOB Restrictions Weight Bearing Restrictions Per Provider Order: Yes LLE Weight Bearing Per Provider Order: Non weight bearing     Mobility Bed Mobility Overal bed mobility: Needs Assistance Bed Mobility: Rolling, Sit to Supine Rolling: Supervision     Sit to supine: Supervision   General bed mobility comments: HOB elevated and use of bedrails    Transfers Overall transfer level: Needs assistance Equipment used: Rolling walker (2 wheels) Transfers: Sit to/from Stand, Bed to chair/wheelchair/BSC Sit to Stand: Min assist Stand pivot transfers: Min assist                Balance Overall balance assessment: Needs assistance Sitting-balance support: No upper extremity supported, Feet supported Sitting balance-Leahy Scale: Fair Sitting balance - Comments: able to maintain static/dynamic sitting balance without UE support and supervision   Standing balance support: Bilateral upper extremity supported, During functional activity, Reliant on assistive device for balance Standing balance-Leahy Scale: Poor Standing balance comment: CGA to Min assist to maintain balance in standing with use of RW                           ADL either performed or assessed with clinical judgement   ADL Overall ADL's : Needs assistance/impaired Eating/Feeding: Set up;Sitting   Grooming: Set up;Sitting   Upper Body Bathing: Set up;Sitting;Supervision/ safety   Lower Body Bathing: Maximal assistance;Sit to/from stand;Cueing for compensatory techniques   Upper Body Dressing : Set up;Sitting   Lower Body Dressing:  Supervision/safety;Set up;Sitting/lateral leans;Maximal assistance;Sit to/from stand;Cueing for compensatory  techniques Lower Body Dressing Details (indicate cue type and reason): Pt able to donn/doff R sock/shoe and thread leg through clothing with Sueprvision sitting. Pt requires Max assist for bringing clothing up/down over hips in standing Toilet Transfer: Minimal assistance;BSC/3in1;Rolling walker (2 wheels) (step-pivot (hop))   Toileting- Clothing Manipulation and Hygiene: Set up;Sitting/lateral lean;Maximal assistance;Sit to/from stand Toileting - Clothing Manipulation Details (indicate cue type and reason): Set up for toileting hygiene in sitting. Max assist for clothing management in sit/stand.     Functional mobility during ADLs:  (deferred this session for pt therapist safety)       Vision Ability to See in Adequate Light: 0 Adequate Patient Visual Report: No change from baseline       Perception         Praxis         Pertinent Vitals/Pain Pain Assessment Pain Assessment: Faces Faces Pain Scale: Hurts even more Pain Location: L LE Pain Descriptors / Indicators: Discomfort, Grimacing, Guarding Pain Intervention(s): Limited activity within patient's tolerance, Monitored during session, Repositioned, Patient requesting pain meds-RN notified     Extremity/Trunk Assessment Upper Extremity Assessment Upper Extremity Assessment: Right hand dominant;RUE deficits/detail;LUE deficits/detail RUE Deficits / Details: decreased fine motor coordination; gross hand strength 3/5; hx of partial 5th digit amputation; pt reports occasional burning sensation in 4th digit not present this session; strength, ROM, sensation, and gross motor coordinaiton otherwise WFL RUE Coordination: decreased fine motor LUE Deficits / Details: decreased fine motor coordination; gross hand strength 3/5; pt reports occasional numbness or buring in ulnar side of hand not present this session; strength, ROM, sensation, and gross motor coordinaiton otherwise WFL LUE Coordination: decreased fine motor   Lower  Extremity Assessment Lower Extremity Assessment: Defer to PT evaluation   Cervical / Trunk Assessment Cervical / Trunk Assessment: Normal   Communication Communication Communication: No apparent difficulties   Cognition Arousal: Alert Behavior During Therapy: WFL for tasks assessed/performed Cognition: No apparent impairments             OT - Cognition Comments: Pt is AAOx4 and pleasant throughout session                 Following commands: Intact       Cueing  General Comments   Cueing Techniques: Verbal cues      Exercises     Shoulder Instructions      Home Living Family/patient expects to be discharged to:: Private residence Living Arrangements: Children;Other relatives (Son, son's fiance, 5 yo and infant grandchildren) Available Help at Discharge: Family;Available PRN/intermittently Type of Home: Mobile home Home Access: Stairs to enter Entrance Stairs-Number of Steps: 4 Entrance Stairs-Rails: Right;Left;Can reach both Home Layout: One level     Bathroom Shower/Tub: Chief Strategy Officer: Standard Bathroom Accessibility: Yes How Accessible: Accessible via walker Home Equipment: Rolling Walker (2 wheels);Cane - single point          Prior Functioning/Environment Prior Level of Function : Independent/Modified Independent             Mobility Comments: using SPC for past year due to balance deficits ADLs Comments: Ind with ADLs and IADLs. Pt works as an Radio producer.    OT Problem List: Decreased strength;Decreased activity tolerance;Decreased coordination;Decreased knowledge of use of DME or AE;Decreased knowledge of precautions;Pain;Impaired UE functional use   OT Treatment/Interventions: Self-care/ADL training;Therapeutic exercise;Energy conservation;DME and/or AE instruction;Therapeutic activities;Patient/family education;Balance training      OT Goals(Current goals can  be found in the care plan section)   Acute Rehab OT  Goals Patient Stated Goal: to remain independent and to be able to keep working as an Radio producer OT Goal Formulation: With patient Time For Goal Achievement: 06/15/24 Potential to Achieve Goals: Good ADL Goals Pt Will Perform Lower Body Bathing: with contact guard assist;sitting/lateral leans Pt Will Perform Lower Body Dressing: with contact guard assist;sitting/lateral leans Pt Will Transfer to Toilet: with supervision;ambulating;bedside commode (with least restrictive AD) Pt Will Perform Toileting - Clothing Manipulation and hygiene: with contact guard assist;sitting/lateral leans;sit to/from stand Pt Will Perform Tub/Shower Transfer: with supervision;ambulating;tub bench (with least restrictive AD) Pt/caregiver will Perform Home Exercise Program: Increased strength;Both right and left upper extremity;With theraputty;Independently;With written HEP provided (increased fine motor coordination)   OT Frequency:  Min 2X/week    Co-evaluation              AM-PAC OT 6 Clicks Daily Activity     Outcome Measure Help from another person eating meals?: A Little Help from another person taking care of personal grooming?: A Little Help from another person toileting, which includes using toliet, bedpan, or urinal?: A Lot Help from another person bathing (including washing, rinsing, drying)?: A Lot Help from another person to put on and taking off regular upper body clothing?: A Little Help from another person to put on and taking off regular lower body clothing?: A Lot 6 Click Score: 15   End of Session Equipment Utilized During Treatment: Gait belt;Rolling walker (2 wheels);Other (comment) (wound vac) Nurse Communication: Mobility status;Patient requests pain meds  Activity Tolerance: Patient tolerated treatment well Patient left: in bed;with call bell/phone within reach (with MD present)  OT Visit Diagnosis: Unsteadiness on feet (R26.81);Other abnormalities of gait and mobility  (R26.89);Muscle weakness (generalized) (M62.81)                Time: 4540-9811 OT Time Calculation (min): 51 min Charges:  OT General Charges $OT Visit: 1 Visit OT Evaluation $OT Eval Moderate Complexity: 1 Mod OT Treatments $Self Care/Home Management : 23-37 mins  Ronnell Coins., OTR/L, MA Acute Rehab [phone]  Walt Gunner 06/01/2024, 2:01 PM

## 2024-06-01 NOTE — Progress Notes (Signed)
 Inpatient Rehab Admissions Coordinator:   I spoke with Pt. Regarding potential CIR admit. He is interested and states that his son and son's fiance are usually home, though they had a baby this week and cannot provide much physical assist. I will follow and submit case to insurance once all needed documentation is in.   Wandalee Gust, MS, CCC-SLP Rehab Admissions Coordinator  [phone] (celll) [phone] (office)

## 2024-06-01 NOTE — Progress Notes (Cosign Needed)
  Progress Note    06/01/2024 6:52 AM 1 Day Post-Op  Subjective:  sleeping and I did not wake him  Tm 99.1 now afebrile  Vitals:   05/31/24 2013 06/01/24 0525  BP: 112/69 124/71  Pulse: 94 78  Resp: 19 18  Temp: 99.1 F (37.3 C) 98.6 F (37 C)  SpO2: 100% 98%    Physical Exam: Incisions:  shrinker sock in place; minimal drainage in vac   CBC    Component Value Date/Time   WBC 6.1 05/31/2024 0527   RBC 4.08 (L) 05/31/2024 0527   HGB 11.8 (L) 05/31/2024 0527   HGB 12.3 (L) 08/11/2021 0929   HCT 37.4 (L) 05/31/2024 0527   HCT 39.1 08/11/2021 0929   PLT 151 05/31/2024 0527   PLT 254 08/11/2021 0929   MCV 91.7 05/31/2024 0527   MCV 84 08/11/2021 0929   MCH 28.9 05/31/2024 0527   MCHC 31.6 05/31/2024 0527   RDW 15.1 05/31/2024 0527   RDW 15.9 (H) 08/11/2021 0929   LYMPHSABS 1.2 05/25/2024 1354   LYMPHSABS 2.0 08/11/2021 0929   MONOABS 1.3 (H) 05/25/2024 1354   EOSABS 0.0 05/25/2024 1354   EOSABS 0.2 08/11/2021 0929   BASOSABS 0.0 05/25/2024 1354   BASOSABS 0.0 08/11/2021 0929    BMET    Component Value Date/Time   NA 137 05/31/2024 0527   NA 141 10/26/2021 1044   K 3.9 05/31/2024 0527   CL 104 05/31/2024 0527   CO2 21 (L) 05/31/2024 0527   GLUCOSE 110 (H) 05/31/2024 0527   BUN 42 (H) 05/31/2024 0527   BUN 33 (H) 10/26/2021 1044   CREATININE 3.19 (H) 05/31/2024 0527   CREATININE 2.11 (H) 11/18/2022 1605   CALCIUM  8.5 (L) 05/31/2024 0527   GFRNONAA 21 (L) 05/31/2024 0527   GFRNONAA 51 (L) 11/01/2016 1012   GFRAA 27 (L) 08/23/2019 0340   GFRAA 58 (L) 11/01/2016 1012    INR    Component Value Date/Time   INR 1.1 07/10/2023 2018     Intake/Output Summary (Last 24 hours) at 06/01/2024 4098 Last data filed at 06/01/2024 0500 Gross per 24 hour  Intake 400 ml  Output 1400 ml  Net -1000 ml     Assessment/Plan:  68 y.o. male is s/p left below knee amputation   1 Day Post-Op   -pt sleeping so I did not wake him -shrinker sock in place and  minimal drainage in vac -dressing off next week.   [name], PA-C [facility] [phone] 06/01/2024 6:52 AM

## 2024-06-01 NOTE — Consult Note (Signed)
 Physical Medicine and Rehabilitation Consult Reason for Consult:Left BKA Referring Physician: Bonita Bussing   HPI: ISAIAH CIANCI is a 68 y.o. male with a history of CAD congestive heart failure, stage IV kidney disease, hypertension, diabetes as well as right metatarsal amputation in 2017 whose had a chronic wound/infection in his left lower extremity which resulted in osteomyelitis.  Patient ultimately underwent a left below-knee amputation on 05/31/2024 by Dr. Edgardo Goodwill.  Nephrology was consulted for management of his kidney disease.  Patient was up with therapy today and was min assist for sit to stand transfers.  Ambulation was not tested.  Patient lives with children in a 1 level home with 4 steps to enter.  Patient was modified independent prior to arrival using a single-point cane primarily.    Home: Home Living Family/patient expects to be discharged to:: Private residence Living Arrangements: Children Available Help at Discharge: Family, Available PRN/intermittently Type of Home: Mobile home Home Access: Stairs to enter [employer] of Steps: 4 Entrance Stairs-Rails: Right, Left, Can reach both Home Layout: One level Bathroom Shower/Tub: Engineer, manufacturing systems: Standard Bathroom Accessibility: Yes Home Equipment: Agricultural consultant (2 wheels), [employer] - single point Additional Comments: pt reports living with his son and family  Functional History: Prior Function Prior Level of Function : Independent/Modified Independent Mobility Comments: using SPC for past year due to balance deficits Functional Status:  Mobility: Bed Mobility Overal bed mobility: Needs Assistance Bed Mobility: Rolling, Supine to Sit Rolling: Supervision Supine to sit: Supervision General bed mobility comments: HOB elevated and use of bedrails Transfers Overall transfer level: Needs assistance Equipment used: Rolling walker (2 wheels) Transfers: Sit to/from Stand, Bed to  chair/wheelchair/BSC Sit to Stand: Min assist Bed to/from chair/wheelchair/BSC transfer type:: Stand pivot Stand pivot transfers: Min assist      ADL:    Cognition: Cognition Orientation Level: Oriented X4 Cognition Arousal: Alert Behavior During Therapy: WFL for tasks assessed/performed   Review of Systems  Constitutional:  Negative for chills and fever.  HENT: Negative.    Eyes: Negative.   Respiratory: Negative.    Cardiovascular: Negative.   Gastrointestinal: Negative.   Genitourinary: Negative.   Musculoskeletal:  Positive for joint pain and myalgias.  Skin:  Negative for rash.  Neurological:  Positive for sensory change and weakness.  Psychiatric/Behavioral:  Negative for suicidal ideas.    Past Medical History:  Diagnosis Date   Acute coronary syndrome (HCC)    Anemia    B12 deficiency    Chest pain    CHF (congestive heart failure) (HCC)    Chronic combined systolic and diastolic CHF (congestive heart failure) (HCC)    a. 07/2015 Echo: EF 45-50%, mod LVH, mid apical/antsept AK, Gr 1 DD.   Chronic low back pain    CKD (chronic kidney disease), stage III (HCC)    Constipation    COPD (chronic obstructive pulmonary disease) (HCC)    Coronary artery disease    a. 2012 s/p MI/PCI: s/p DES to mid/dist RCA and overlapping DES to LAD;  b. 07/2015 Cath: LAD 10% ISR, D1 40(jailed), LCX 73m, OM2 30, OM3 30, RCA 39m ISR, 10d ISR; c. 07/2016 NSTEMI/PCI: LAD 85ost/p/m (3.5x20 Synergy DES overlapping prior stent), D1 40, LCX 33m, OM2 95 (2.75x16 Synergy DES), OM3 30, RCA 72m, 10d.   Depression    Diabetic gastroparesis (HCC)    Diabetic polyneuropathy (HCC)    DKA (diabetic ketoacidoses) 03/14/2018   GERD (gastroesophageal reflux disease)    Gout  Heart failure (HCC)    Heart murmur    History of MI (myocardial infarction)    Hyperlipemia    Hypertension    Hypertensive heart disease    Hypokalemia    Ischemic cardiomyopathy    a. 07/2015 Echo: EF 45-50%, mod LVH,  mid-apicalanteroseptal AK, Gr1 DD.   Joint pain    Kidney problem    Morbid obesity (HCC)    OSA on CPAP    Osteoarthritis    Osteomyelitis (HCC)    a. 07/2016 MTP joint of R great toe s/p transmetatarsal amputation.   Other fatigue    Polyneuropathy in diabetes(357.2)    Pulmonary sarcoidosis (HCC)    problems w/it years ago; no problems anymore (07/03/2014)   Rectal bleeding    Shortness of breath    Shortness of breath on exertion    Sleep apnea    Stomach ulcer    Swallowing difficulty    Type II diabetes mellitus (HCC)    Past Surgical History:  Procedure Laterality Date   ABDOMINAL AORTOGRAM W/LOWER EXTREMITY N/A 11/29/2022   Procedure: ABDOMINAL AORTOGRAM W/LOWER EXTREMITY;  Surgeon: Adine Hoof, MD;  Location: [facility] INVASIVE CV LAB;  Service: Cardiovascular;  Laterality: N/A;   AMPUTATION Right 07/24/2016   Procedure: TRANSMETATARSAL FOOT AMPUTATION;  Surgeon: Timothy Ford, MD;  Location: [facility] OR;  Service: Orthopedics;  Laterality: Right;   AMPUTATION Left 05/31/2024   Procedure: AMPUTATION BELOW KNEE;  Surgeon: Carlene Che, MD;  Location: [facility] OR;  Service: Vascular;  Laterality: Left;   APPLICATION OF WOUND VAC Left 11/30/2022   Procedure: APPLICATION OF WOUND VAC;  Surgeon: Floyce Hutching, DPM;  Location: [facility] OR;  Service: Podiatry;  Laterality: Left;   BALLOON DILATION N/A 03/21/2018   Procedure: BALLOON DILATION;  Surgeon: Lanita Pitman, MD;  Location: [facility] ENDOSCOPY;  Service: Endoscopy;  Laterality: N/A;   BIOPSY  03/16/2022   Procedure: BIOPSY;  Surgeon: Baldo Bonds, MD;  Location: [facility] ENDOSCOPY;  Service: Endoscopy;;   BREAST LUMPECTOMY Right ~ 2000   benign   CARDIAC CATHETERIZATION N/A 07/30/2015   Procedure: Left Heart Cath and Coronary Angiography;  Surgeon: Odie Benne, MD;  Location: [facility] INVASIVE CV LAB;  Service: Cardiovascular;  Laterality: N/A;   CARDIAC CATHETERIZATION N/A 07/19/2016   Procedure: Left Heart Cath and Coronary  Angiography;  Surgeon: Arty Binning, MD;  Location: [facility] INVASIVE CV LAB;  Service: Cardiovascular;  Laterality: N/A;   CARDIAC CATHETERIZATION N/A 07/29/2016   Procedure: Coronary Stent Intervention;  Surgeon: Arty Binning, MD;  Location: [facility] INVASIVE CV LAB;  Service: Cardiovascular;  Laterality: N/A;   CARDIAC CATHETERIZATION N/A 07/29/2016   Procedure: Intravascular Ultrasound/IVUS;  Surgeon: Arty Binning, MD;  Location: [facility] INVASIVE CV LAB;  Service: Cardiovascular;  Laterality: N/A;   CATARACT EXTRACTION W/ INTRAOCULAR LENS  IMPLANT, BILATERAL Bilateral 2014-2015   COLONOSCOPY WITH PROPOFOL  N/A 08/22/2017   Procedure: COLONOSCOPY WITH PROPOFOL ;  Surgeon: Baldo Bonds, MD;  Location: [facility] ENDOSCOPY;  Service: Endoscopy;  Laterality: N/A;   COLONOSCOPY WITH PROPOFOL  N/A 03/16/2022   Procedure: COLONOSCOPY WITH PROPOFOL ;  Surgeon: Baldo Bonds, MD;  Location: [facility] ENDOSCOPY;  Service: Endoscopy;  Laterality: N/A;   CORONARY ANGIOPLASTY WITH STENT PLACEMENT  10/31/2011   30 % MID ATRIOVENTRICULAR GROOVE CX STENOSIS. 90 % MID RCA.PTA TO LAD/STENTING OF TANDEM 60-70%. LAD STENOSIS 99% REDUCED TO 0%.3.0 X PROMUS DES POSTDILATED TO 3.25MM.   CORONARY ANGIOPLASTY WITH STENT PLACEMENT  07/03/2014   2   CORONARY  STENT INTERVENTION N/A 04/09/2019   Procedure: CORONARY STENT INTERVENTION;  Surgeon: Lucendia Rusk, MD;  Location: [facility] INVASIVE CV LAB;  Service: Cardiovascular;  Laterality: N/A;   CORONARY STENT INTERVENTION N/A 08/22/2019   Procedure: CORONARY STENT INTERVENTION;  Surgeon: Swaziland, Peter M, MD;  Location: [facility] INVASIVE CV LAB;  Service: Cardiovascular;  Laterality: N/A;   ESOPHAGOGASTRODUODENOSCOPY (EGD) WITH PROPOFOL  N/A 03/21/2018   Procedure: ESOPHAGOGASTRODUODENOSCOPY (EGD) WITH PROPOFOL ;  Surgeon: Lanita Pitman, MD;  Location: [facility] ENDOSCOPY;  Service: Endoscopy;  Laterality: N/A;   ESOPHAGOGASTRODUODENOSCOPY (EGD) WITH PROPOFOL  N/A 03/16/2022   Procedure:  ESOPHAGOGASTRODUODENOSCOPY (EGD) WITH PROPOFOL ;  Surgeon: Baldo Bonds, MD;  Location: [facility] ENDOSCOPY;  Service: Endoscopy;  Laterality: N/A;   GRAFT APPLICATION Left 11/30/2022   Procedure: GRAFT APPLICATION;  Surgeon: Floyce Hutching, DPM;  Location: [facility] OR;  Service: Podiatry;  Laterality: Left;   I & D EXTREMITY Right 10/30/2013   Procedure: IRRIGATION AND DEBRIDEMENT RIGHT SMALL FINGER POSSIBLE SECONDARY WOUND CLOSURE;  Surgeon: Hedy Living, MD;  Location: [facility] ORS;  Service: Orthopedics;  Laterality: Right;  Donzella Galley is available after 4pm   I & D EXTREMITY Right 11/01/2013   Procedure:  IRRIGATION AND DEBRIDEMENT RIGHT  PROXIMAL PHALANGEAL LEVEL AMPUTATION;  Surgeon: Hedy Living, MD;  Location: [facility] ORS;  Service: Orthopedics;  Laterality: Right;   INCISION AND DRAINAGE Right 10/28/2013   Procedure: INCISION AND DRAINAGE Right small finger;  Surgeon: Hedy Living, MD;  Location: [facility] ORS;  Service: Orthopedics;  Laterality: Right;   INCISION AND DRAINAGE OF WOUND Left 11/07/2022   Procedure: IRRIGATION AND DEBRIDEMENT WOUND;  Surgeon: Evertt Hoe, DPM;  Location: [facility] OR;  Service: Podiatry;  Laterality: Left;  Left foot ucler debridement and bone biopsy   LEFT HEART CATH Left 11/03/2011   Procedure: LEFT HEART CATH;  Surgeon: Millicent Ally, MD;  Location: [facility] CATH LAB;  Service: Cardiovascular;  Laterality: Left;   LEFT HEART CATH AND CORONARY ANGIOGRAPHY N/A 04/09/2019   Procedure: LEFT HEART CATH AND CORONARY ANGIOGRAPHY;  Surgeon: Lucendia Rusk, MD;  Location: [facility] INVASIVE CV LAB;  Service: Cardiovascular;  Laterality: N/A;   LEFT HEART CATHETERIZATION WITH CORONARY ANGIOGRAM N/A 07/03/2014   Procedure: LEFT HEART CATHETERIZATION WITH CORONARY ANGIOGRAM;  Surgeon: Mickiel Albany, MD;  Location: [facility] CATH LAB;  Service: Cardiovascular;  Laterality: N/A;   PERCUTANEOUS CORONARY STENT INTERVENTION (PCI-S) Left 11/03/2011   Procedure: PERCUTANEOUS CORONARY  STENT INTERVENTION (PCI-S);  Surgeon: Millicent Ally, MD;  Location: [facility] CATH LAB;  Service: Cardiovascular;  Laterality: Left;   RIGHT/LEFT HEART CATH AND CORONARY ANGIOGRAPHY N/A 08/22/2019   Procedure: RIGHT/LEFT HEART CATH AND CORONARY ANGIOGRAPHY;  Surgeon: Swaziland, Peter M, MD;  Location: [facility] INVASIVE CV LAB;  Service: Cardiovascular;  Laterality: N/A;   TOE AMPUTATION Right ~ 2010   2nd toe   TOE AMPUTATION Right 2012   3rd toe   WOUND DEBRIDEMENT Left 11/30/2022   Procedure: DEBRIDEMENT WOUND;  Surgeon: Floyce Hutching, DPM;  Location: [facility] OR;  Service: Podiatry;  Laterality: Left;   Family History  Problem Relation Age of Onset   Sudden death Mother    Heart attack Maternal Aunt    Diabetes Maternal Grandmother    Hypertension Maternal Grandmother    Sudden death Other    Neuropathy Neg Hx    Social History:  reports that he has never smoked. He has never used smokeless tobacco. He reports current alcohol use. He reports that he does not use drugs.  Allergies:  Allergies  Allergen Reactions   Amlodipine  Besylate     Other Reaction(s): leg cramps   Chlorthalidone Other (See Comments)    Causes dehydration, kidneys shut down  Other Reaction(s): Kidney Failure   Medications Prior to Admission  Medication Sig Dispense Refill   acetaminophen  (TYLENOL ) 325 MG tablet Take 2 tablets (650 mg total) by mouth every 6 (six) hours as needed for mild pain (or Fever >/= 101).     allopurinol  (ZYLOPRIM ) 100 MG tablet Take 1 tablet (100 mg total) by mouth daily. (Patient taking differently: Take 100 mg by mouth in the morning.) 100 tablet 1   aspirin  EC 81 MG tablet Take 1 tablet (81 mg total) by mouth daily. (Patient taking differently: Take 81 mg by mouth in the morning.) 30 tablet 0   cyclobenzaprine  (FLEXERIL ) 10 MG tablet Take 1 tablet (10 mg total) by mouth 3 (three) times daily as needed. 15 tablet 0   DULoxetine  (CYMBALTA ) 20 MG capsule Take 1 capsule (20 mg total) by mouth 2 (two)  times daily. 200 capsule 1   famotidine  (PEPCID ) 20 MG tablet Take 1 tablet (20 mg total) by mouth at bedtime. 30 tablet 0   furosemide  (LASIX ) 40 MG tablet Take 1 tablet (40 mg total) by mouth 2 (two) times daily. 200 tablet 0   gabapentin  (NEURONTIN ) 100 MG capsule Take 1 capsule (100 mg total) by mouth at bedtime. 100 capsule 1   insulin  glargine (LANTUS  SOLOSTAR) 100 UNIT/ML Solostar Pen Inject 25 Units into the skin every morning. 15 mL 2   insulin  lispro (HUMALOG  KWIKPEN) 100 UNIT/ML KwikPen Inject 5-14 Units into the skin 3 (three) times daily with meals. Inject 5 units with meals, add the following per glucose level. CBG 70 - 120: 0 units CBG 121 - 150: 1 unit CBG 151 - 200: 2 units CBG 201 - 250: 3 units CBG 251 - 300: 5 units CBG 301 - 350: 7 units CBG 351 - 400: 9 units MDD: 50 units 30 mL 1   isosorbide  mononitrate (IMDUR ) 60 MG 24 hr tablet Take 2 tablets by mouth in the morning AND 1 tablet every evening. (Patient taking differently: Take 2 tablets by mouth in the morning AND then take 1 tablet by mouth every evening.) 270 tablet 3   latanoprost  (XALATAN ) 0.005 % ophthalmic solution Place 1 drop into both eyes at bedtime. 7.5 mL 1   losartan  (COZAAR ) 25 MG tablet Take 1 tablet (25 mg total) by mouth daily. (Patient taking differently: Take 25 mg by mouth in the morning.) 100 tablet 0   metoprolol  succinate (TOPROL -XL) 100 MG 24 hr tablet Take 1 tablet (100 mg total) by mouth daily. (Patient taking differently: Take 100 mg by mouth in the morning.) 90 tablet 1   nitroGLYCERIN  (NITROSTAT ) 0.4 MG SL tablet Place 1 tablet (0.4 mg total) under the tongue every 5 (five) minutes as needed for chest pain. 25 tablet 3   pantoprazole  (PROTONIX ) 40 MG tablet Take 1 tablet (40 mg total) by mouth 2 (two) times daily. 60 tablet 0   rosuvastatin  (CRESTOR ) 40 MG tablet Take 1 tablet (40 mg total) by mouth daily. (Patient taking differently: Take 40 mg by mouth in the morning.) 90 tablet 3   Semaglutide , 1  MG/DOSE, (OZEMPIC , 1 MG/DOSE,) 4 MG/3ML SOPN Inject 1 mg into the skin once a week. (Patient taking differently: Inject 1 mg into the skin once a week. On Thursday) 9 mL 3   SILVER  SULFADIAZINE  EX  Apply 1 Application topically 3 (three) times a week. No set days for the patient.     ticagrelor  (BRILINTA ) 90 MG TABS tablet Take 1 tablet (90 mg total) by mouth 2 (two) times daily. 180 tablet 3   Blood Glucose Monitoring Suppl (BLOOD GLUCOSE MONITOR SYSTEM) w/Device KIT Use in the morning, at noon, and at bedtime. 1 kit 0   Continuous Glucose Receiver (FREESTYLE LIBRE 3 READER) DEVI Use as advised 1 each 0   Continuous Glucose Sensor (FREESTYLE LIBRE 3 PLUS SENSOR) MISC 1 each by Does not apply route every 14 (fourteen) days. 6 each 3   Insulin  Pen Needle 32G X 4 MM MISC Use with Lantus  100 each 0     Blood pressure 131/78, pulse 81, temperature 98.2 F (36.8 C), resp. rate 18, height 5' 9 (1.753 m), weight 110.2 kg, SpO2 99%. Physical Exam Constitutional:      General: He is not in acute distress.    Appearance: He is not ill-appearing.  HENT:     Head: Normocephalic and atraumatic.     Right Ear: External ear normal.     Left Ear: External ear normal.     Nose: Nose normal.     Mouth/Throat:     Mouth: Mucous membranes are moist.   Eyes:     Pupils: Pupils are equal, round, and reactive to light.    Cardiovascular:     Rate and Rhythm: Normal rate.  Pulmonary:     Effort: Pulmonary effort is normal.  Abdominal:     Palpations: Abdomen is soft.   Musculoskeletal:     Cervical back: Normal range of motion.     Comments: Left BKA in limb guard. Right TMA   Skin:    Comments: Left BKA in vac with serosang drainage in cannister. Right TMA well healed   Neurological:     Mental Status: He is alert.     Comments: Alert and oriented x 3. Normal insight and awareness. Intact Memory. Normal language and speech. Cranial nerve exam unremarkable. MMT: BUE 5/5 prox to distal. RLE 4+/5  HF, KE and 5/5 ADF/PF.  Left HF 4-/5, in [place].  Sensation decreased to LT in RLE below mid calf. DTR's 1+. No abnl resting tone  Psychiatric:        Mood and Affect: Mood normal.        Behavior: Behavior normal.     Results for orders placed or performed during the hospital encounter of 05/25/24 (from the past 24 hours)  Glucose, capillary     Status: Abnormal   Collection Time: 05/31/24  3:58 PM  Result Value Ref Range   Glucose-Capillary 237 (H) 70 - 99 mg/dL  Glucose, capillary     Status: Abnormal   Collection Time: 05/31/24  8:47 PM  Result Value Ref Range   Glucose-Capillary 207 (H) 70 - 99 mg/dL  Creatinine, serum     Status: Abnormal   Collection Time: 06/01/24  5:54 AM  Result Value Ref Range   Creatinine, Ser 3.03 (H) 0.61 - 1.24 mg/dL   GFR, Estimated 22 (L) >60 mL/min  Glucose, capillary     Status: Abnormal   Collection Time: 06/01/24  6:36 AM  Result Value Ref Range   Glucose-Capillary 157 (H) 70 - 99 mg/dL  Glucose, capillary     Status: Abnormal   Collection Time: 06/01/24 11:41 AM  Result Value Ref Range   Glucose-Capillary 179 (H) 70 - 99 mg/dL   *Note: Due to  a large number of results and/or encounters for the requested time period, some results have not been displayed. A complete set of results can be found in Results Review.   No results found.  Assessment/Plan: Diagnosis: 68 year old male with a right TMA who had a nonhealing ulcer and osteomyelitis in his left lower extremity requiring a left below-knee amputation 05/31/24. Does the need for close, 24 hr/day medical supervision in concert with the patient's rehab needs make it unreasonable for this patient to be served in a less intensive setting? Yes Co-Morbidities requiring supervision/potential complications:  - Wound care considerations -Infectious disease considerations -Stage IV kidney disease -Type 2 diabetes -Postoperative pain considerations, phantom pain -Preprostatic education -Psychological  support after limb loss Due to bladder management, bowel management, safety, skin/wound care, disease management, medication administration, pain management, and patient education, does the patient require 24 hr/day rehab nursing? Yes Does the patient require coordinated care of a physician, rehab nurse, therapy disciplines of PT, OT to address physical and functional deficits in the context of the above medical diagnosis(es)? Yes Addressing deficits in the following areas: balance, endurance, locomotion, strength, transferring, bowel/bladder control, bathing, dressing, feeding, grooming, toileting, and psychosocial support Can the patient actively participate in an intensive therapy program of at least 3 hrs of therapy per day at least 5 days per week? Yes The potential for patient to make measurable gains while on inpatient rehab is excellent Anticipated functional outcomes upon discharge from inpatient rehab are modified independent at w/c level, supervision short dx gait? with PT, modified independent at w/c level with OT, n/a with SLP. Estimated rehab length of stay to reach the above functional goals is: 7 days Anticipated discharge destination: Home Overall Rehab/Functional Prognosis: excellent  POST ACUTE RECOMMENDATIONS: This patient's condition is appropriate for continued rehabilitative care in the following setting: CIR Patient has agreed to participate in recommended program. Yes Note that insurance prior authorization may be required for reimbursement for recommended care.  Comment: Pt is extremely motivated. Has 4 steps to enter home. He can reach modified independent goals with a ~7 day rehab admission. Rehab Admissions Coordinator to follow up.      I have personally performed a face to face diagnostic evaluation of this patient. Additionally, I have examined the patient's medical record including any pertinent labs and radiographic images.    Thanks,  Rawland Caddy,  MD 06/01/2024

## 2024-06-02 DIAGNOSIS — E11628 Type 2 diabetes mellitus with other skin complications: Secondary | ICD-10-CM | POA: Diagnosis not present

## 2024-06-02 DIAGNOSIS — L089 Local infection of the skin and subcutaneous tissue, unspecified: Secondary | ICD-10-CM | POA: Diagnosis not present

## 2024-06-02 LAB — GLUCOSE, CAPILLARY
Glucose-Capillary: 139 mg/dL — ABNORMAL HIGH (ref 70–99)
Glucose-Capillary: 103 mg/dL — ABNORMAL HIGH (ref 70–99)
Glucose-Capillary: 77 mg/dL (ref 70–99)
Glucose-Capillary: 81 mg/dL (ref 70–99)
Glucose-Capillary: 179 mg/dL — ABNORMAL HIGH (ref 70–99)

## 2024-06-02 MED ORDER — INSULIN ASPART 100 UNIT/ML IJ SOLN: 0.0000 [IU] | Freq: Every day | INTRAMUSCULAR | Status: DC

## 2024-06-02 MED ORDER — INSULIN GLARGINE-YFGN 100 UNIT/ML ~~LOC~~ SOLN
10.0000 [IU] | Freq: Two times a day (BID) | SUBCUTANEOUS | Status: DC
  Administered 2024-06-02 – 2024-06-03 (×4): 10 [IU] via SUBCUTANEOUS
  Filled 2024-06-02 (×7): qty 0.1

## 2024-06-02 MED ORDER — INSULIN ASPART 100 UNIT/ML IJ SOLN
2.0000 [IU] | Freq: Three times a day (TID) | INTRAMUSCULAR | Status: DC
  Administered 2024-06-04 – 2024-06-06 (×7): 2 [IU] via SUBCUTANEOUS

## 2024-06-02 MED ORDER — INSULIN ASPART 100 UNIT/ML IJ SOLN
0.0000 [IU] | Freq: Three times a day (TID) | INTRAMUSCULAR | Status: DC
  Administered 2024-06-05: 3 [IU] via SUBCUTANEOUS

## 2024-06-02 NOTE — Progress Notes (Signed)
   06/02/24 0056  BiPAP/CPAP/SIPAP  $ Non-Invasive Home Ventilator  Subsequent  BiPAP/CPAP/SIPAP Pt Type Adult  BiPAP/CPAP/SIPAP DREAMSTATIOND  Mask Type Full face mask  Mask Size Medium  Patient Home Machine No  Patient Home Mask No  Patient Home Tubing No  BiPAP/CPAP /SiPAP Vitals  Pulse Rate 73  Resp 16  SpO2 100 %  Bilateral Breath Sounds Diminished  MEWS Score/Color  MEWS Score 0  MEWS Score Color [place]

## 2024-06-02 NOTE — Progress Notes (Signed)
 [facility] PROGRESS NOTE    Progress Note  Joshua [name]  FMW:[id number] DOB: 01/30/1956 DOA: 05/25/2024 PCP: Wonda Worth SQUIBB, PA    Brief Narrative:   Joshua [name] is an 68 y.o. male past medical history of CAD with multiple PCI's, HFpEF, essential hypertension diabetes mellitus type 2 with peripheral neuropathy status post right metatarsal amputation 2017, with 40 left lower extremity healing wound, chronic kidney see stage IV, obesity, he relates he has been left lower extremity daily for years he follows up with ID and podiatry multiple rounds of antibiotics.  He has been offered right lower extremity amputation which he has refused.  Presented on the day of admission to the podiatrist office was sent to the ED with left foot diabetic ulcer with osteomyelitis. Assessment/Plan:   Left foot ulcer with osteomyelitis/ Diabetic foot infection [facility]): Podiatry was consulted recommended vascular surgery consult and orthopedic. Continue empirically on antibiotics,  doxycycline  and Unasyn . S/p left BKA on 05/31/2024. Further management per vascular surgery. PT evaluated the patient will need inpatient rehab, awaiting inpatient rehab admission  Acute kidney injury on chronic disease stage IV: Nephrology was consulted recommended to restart Lasix  and transition vancomycin  to Doxy. They have signed off. Avoid hypotension.  Metabolic acidosis: Likely due to chronic renal disease, now on sodium bicarb tabs. Now resolved.  Hypokalemia: Repleted now improved.  Anemia of chronic renal disease  Globin has remained relatively stable.  Diabetes mellitus type 2: Continue sliding scale insulin .  Essential hypertension: ARB has been discontinued, continue metoprolol , Lasix  and Imdur    DVT prophylaxis: Lovenox  Family Communication:none Status is: Inpatient Remains inpatient appropriate because: Diabetic foot ulcer infection    Code Status:     Code Status Orders  (From  admission, onward)           Start     Ordered   05/25/24 1738  Full code  Continuous       Question:  By:  Answer:  Consent: discussion documented in EHR   05/25/24 1738           Code Status History     Date Active Date Inactive Code Status Order ID Comments User Context   07/10/2023 2224 07/13/2023 2313 Full Code [id number]  Marcene Eva NOVAK, DO ED   11/26/2022 0242 12/10/2022 0140 Full Code [id number]  Marcene Eva NOVAK, DO ED   11/06/2022 0106 11/16/2022 2122 Full Code [id number]  Tobie Jorie SAUNDERS, MD ED   09/25/2020 0128 09/28/2020 1948 Full Code [id number]  Chotiner, Adine RAMAN, MD ED   08/16/2019 2048 08/23/2019 1620 Full Code [id number]  Donnamarie Lebron JINNY, MD Inpatient   04/06/2019 2342 04/10/2019 1818 Full Code [id number]  Merline Lares, MD Inpatient   03/15/2018 0011 03/22/2018 1936 Full Code [id number]  Alm Maxwell LABOR, MD ED   05/19/2017 1908 05/23/2017 1549 Full Code [id number]  Charlton Evalene RAMAN, MD ED   10/11/2016 1645 10/14/2016 0025 Full Code [id number]  Maxie Herlene POUR, PA-C ED   08/03/2016 1807 08/13/2016 1323 Full Code [id number]  Pegge Toribio JINNY, PA-C Inpatient   08/03/2016 1807 08/03/2016 1807 Full Code [id number]  Pegge Toribio JINNY, PA-C Inpatient   07/18/2016 2307 08/03/2016 1802 Full Code [id number]  Blanca Elsie RAMAN, MD Inpatient   07/30/2015 1139 07/31/2015 2018 Full Code [id number]  Verlin Lonni BIRCH, MD Inpatient   07/29/2015 0705 07/30/2015 1139 Full Code [id number]  Mona Vinie BROCKS, MD Inpatient   07/03/2014 1222 07/04/2014 1349 Full Code [id number]  Claudene Victory ORN, MD Inpatient  10/29/2013 0258 11/02/2013 1958 Full Code [id number]  Ladora Coy, MD Inpatient         IV Access:   Peripheral IV   Procedures and diagnostic studies:   No results found.   Medical Consultants:   None.   Subjective:    Joshua [name] no complaints this morning.  Objective:    Vitals:   06/01/24 1629 06/01/24 1927 06/02/24 0056 06/02/24 0752  BP: 118/67 139/80  123/69  Pulse:  74 73 73 89  Resp: 18 16 16 15   Temp: 98.2 F (36.8 C) 98.4 F (36.9 C)    TempSrc:      SpO2: 100% 100% 100% 100%  Weight:      Height:       SpO2: 100 % O2 Flow Rate (L/min): 2 L/min FiO2 (%): 32 %   Intake/Output Summary (Last 24 hours) at 06/02/2024 0804 Last data filed at 06/02/2024 0212 Gross per 24 hour  Intake 720 ml  Output 1875 ml  Net -1155 ml   Filed Weights   05/25/24 1332 05/31/24 0803  Weight: 109.3 kg 110.2 kg    Exam: General exam: In no acute distress. Respiratory system: Good air movement and clear to auscultation. Cardiovascular system: S1 & S2 heard, RRR. No JVD. Gastrointestinal system: Abdomen is nondistended, soft and nontender.  Extremities: Wound VAC in place Skin: No rashes, lesions or ulcers Psychiatry: Judgement and insight appear normal. Mood & affect appropriate. Data Reviewed:    Labs: Basic Metabolic Panel: Recent Labs  Lab 05/26/24 1034 05/27/24 0858 05/27/24 9141 05/28/24 9378 05/29/24 0604 05/30/24 0548 05/31/24 0527 06/01/24 0554  NA  --  138  --  134* 137 136 137  --   K  --  3.7   < > 2.9* 3.8 3.6 3.9  --   CL  --  105  --  103 105 104 104  --   CO2  --  18*  --  22 22 20* 21*  --   GLUCOSE  --  143*  --  165* 122* 89 110*  --   BUN  --  70*  --  61* 53* 44* 42*  --   CREATININE  --  5.26*   < > 4.82* 4.11* 3.56* 3.19* 3.03*  CALCIUM   --  9.0  --  8.5* 8.9 8.6* 8.5*  --   MG 2.1  --   --  2.1  --   --   --   --   PHOS  --   --   --  5.0*  --   --   --   --    < > = values in this interval not displayed.   GFR Estimated Creatinine Clearance: 29.7 mL/min (A) (by C-G formula based on SCr of 3.03 mg/dL (H)). Liver Function Tests: No results for input(s): AST, ALT, ALKPHOS, BILITOT, PROT, ALBUMIN in the last 168 hours.  No results for input(s): LIPASE, AMYLASE in the last 168 hours. No results for input(s): AMMONIA in the last 168 hours. Coagulation profile No results for input(s): INR, PROTIME  in the last 168 hours. COVID-19 Labs  No results for input(s): DDIMER, FERRITIN, LDH, CRP in the last 72 hours.  Lab Results  Component Value Date   SARSCOV2NAA NEGATIVE 07/10/2023   SARSCOV2NAA NEGATIVE 09/25/2020   SARSCOV2NAA NEGATIVE 08/16/2019    CBC: Recent Labs  Lab 05/27/24 0858 05/29/24 0604 05/30/24 0548 05/31/24 0527  WBC 7.5 7.5 6.1 6.1  HGB 13.3 12.8*  12.0* 11.8*  HCT 41.9 39.4 37.3* 37.4*  MCV 91.7 89.5 90.5 91.7  PLT 129* 178 161 151   Cardiac Enzymes: No results for input(s): CKTOTAL, CKMB, CKMBINDEX, TROPONINI in the last 168 hours. BNP (last 3 results) No results for input(s): PROBNP in the last 8760 hours. CBG: Recent Labs  Lab 06/01/24 0636 06/01/24 1141 06/01/24 1629 06/01/24 2117 06/02/24 0645  GLUCAP 157* 179* 167* 186* 139*   D-Dimer: No results for input(s): DDIMER in the last 72 hours. Hgb A1c: No results for input(s): HGBA1C in the last 72 hours. Lipid Profile: No results for input(s): CHOL, HDL, LDLCALC, TRIG, CHOLHDL, LDLDIRECT in the last 72 hours. Thyroid  function studies: No results for input(s): TSH, T4TOTAL, T3FREE, THYROIDAB in the last 72 hours.  Invalid input(s): FREET3 Anemia work up: No results for input(s): VITAMINB12, FOLATE, FERRITIN, TIBC, IRON, RETICCTPCT in the last 72 hours. Sepsis Labs: Recent Labs  Lab 05/27/24 0858 05/29/24 0604 05/30/24 0548 05/31/24 0527  WBC 7.5 7.5 6.1 6.1   Microbiology Recent Results (from the past 240 hours)  MRSA Next Gen by PCR, Nasal     Status: None   Collection Time: 05/30/24  8:16 AM   Specimen: Nasal Mucosa; Nasal Swab  Result Value Ref Range Status   MRSA by PCR Next Gen NOT DETECTED NOT DETECTED Final    Comment: (NOTE) The GeneXpert MRSA Assay (FDA approved for NASAL specimens only), is one component of a comprehensive MRSA colonization surveillance program. It is not intended to diagnose MRSA infection nor to  guide or monitor treatment for MRSA infections. Test performance is not FDA approved in patients less than 52 years old. Performed at [facility], [address] [place], [place] [address]      Medications:    aspirin   81 mg Oral Daily   doxycycline   100 mg Oral Q12H   DULoxetine   20 mg Oral BID   enoxaparin  (LOVENOX ) injection  30 mg Subcutaneous Q24H   famotidine   10 mg Oral QHS   furosemide   40 mg Oral BID   gabapentin   100 mg Oral QHS   insulin  aspart  0-15 Units Subcutaneous TID WC   insulin  glargine-yfgn  25 Units Subcutaneous QHS   isosorbide  mononitrate  120 mg Oral Daily   latanoprost   1 drop Both Eyes QHS   metoprolol  succinate  100 mg Oral Daily   pantoprazole   40 mg Oral BID   rosuvastatin   10 mg Oral Daily   tamsulosin   0.4 mg Oral QPC supper   Continuous Infusions:  ampicillin -sulbactam (UNASYN ) IV 3 g (06/02/24 0212)      LOS: 8 days   Joshua [name]  [facility]  06/02/2024, 8:04 AM

## 2024-06-02 NOTE — Progress Notes (Signed)
 Patient to self administer CPAP when ready for bed. Machine is within patients reach and ready for use. Patient is familiar with equipment and procedure.

## 2024-06-03 DIAGNOSIS — L089 Local infection of the skin and subcutaneous tissue, unspecified: Secondary | ICD-10-CM | POA: Diagnosis not present

## 2024-06-03 DIAGNOSIS — E11628 Type 2 diabetes mellitus with other skin complications: Secondary | ICD-10-CM | POA: Diagnosis not present

## 2024-06-03 LAB — GLUCOSE, CAPILLARY
Glucose-Capillary: 70 mg/dL (ref 70–99)
Glucose-Capillary: 139 mg/dL — ABNORMAL HIGH (ref 70–99)
Glucose-Capillary: 146 mg/dL — ABNORMAL HIGH (ref 70–99)
Glucose-Capillary: 140 mg/dL — ABNORMAL HIGH (ref 70–99)

## 2024-06-03 NOTE — Plan of Care (Signed)
  Problem: Metabolic: Goal: Ability to maintain appropriate glucose levels will improve Outcome: Not Progressing   Problem: Skin Integrity: Goal: Risk for impaired skin integrity will decrease Outcome: Not Progressing   Problem: Clinical Measurements: Goal: Will remain free from infection Outcome: Not Progressing   Problem: Safety: Goal: Ability to remain free from injury will improve Outcome: Not Progressing

## 2024-06-03 NOTE — Progress Notes (Signed)
 CBG 70, patient given orange juice and instructed to call for his breakfast.

## 2024-06-03 NOTE — Progress Notes (Signed)
 [facility] PROGRESS NOTE    Progress Note  Joshua [name]  FMW:[id number] DOB: [birth year] DOA: 05/25/2024 PCP: Wonda Worth SQUIBB, PA    Brief Narrative:   Joshua [name] is an 68 y.o. male past medical history of CAD with multiple PCI's, HFpEF, essential hypertension diabetes mellitus type 2 with peripheral neuropathy status post right metatarsal amputation 2017, with 40 left lower extremity healing wound, chronic kidney see stage IV, obesity, he relates he has been left lower extremity daily for years he follows up with ID and podiatry multiple rounds of antibiotics.  He has been offered right lower extremity amputation which he has refused.  Presented on the day of admission to the podiatrist office was sent to the ED with left foot diabetic ulcer with osteomyelitis. Assessment/Plan:   Left foot ulcer with osteomyelitis/ Diabetic foot infection [facility]): Podiatry was consulted recommended vascular surgery consult and orthopedic. Continue empirically on antibiotics,  doxycycline  and Unasyn . S/p left BKA on 05/31/2024. Further management per vascular surgery. PT evaluated the patient will need inpatient rehab, awaiting inpatient rehab admission  Acute kidney injury on chronic disease stage IV: Nephrology was consulted recommended to restart Lasix  and transition vancomycin  to Doxy. They have signed off. Avoid hypotension.  Metabolic acidosis: Likely due to chronic renal disease, now on sodium bicarb tabs. Now resolved.  Hypokalemia: Repleted now improved.  Anemia of chronic renal disease  Globin has remained relatively stable.  Diabetes mellitus type 2: Continue sliding scale insulin .  Essential hypertension: ARB has been discontinued, continue metoprolol , Lasix  and Imdur    DVT prophylaxis: Lovenox  Family Communication:none Status is: Inpatient Remains inpatient appropriate because: Diabetic foot ulcer infection    Code Status:     Code Status Orders  (From  admission, onward)           Start     Ordered   05/25/24 1738  Full code  Continuous       Question:  By:  Answer:  Consent: discussion documented in EHR   05/25/24 1738           Code Status History     Date Active Date Inactive Code Status Order ID Comments User Context   07/10/2023 2224 07/13/2023 2313 Full Code [id number]  Marcene Eva NOVAK, DO ED   11/26/2022 0242 12/10/2022 0140 Full Code [id number]  Marcene Eva NOVAK, DO ED   11/06/2022 0106 11/16/2022 2122 Full Code [id number]  Tobie Jorie SAUNDERS, MD ED   09/25/2020 0128 09/28/2020 1948 Full Code [id number]  Chotiner, Adine RAMAN, MD ED   08/16/2019 2048 08/23/2019 1620 Full Code [id number]  Donnamarie Lebron JINNY, MD Inpatient   04/06/2019 2342 04/10/2019 1818 Full Code [id number]  Merline Lares, MD Inpatient   03/15/2018 0011 03/22/2018 1936 Full Code [id number]  Alm Maxwell LABOR, MD ED   05/19/2017 1908 05/23/2017 1549 Full Code [id number]  Charlton Evalene RAMAN, MD ED   10/11/2016 1645 10/14/2016 0025 Full Code [id number]  Maxie Herlene POUR, PA-C ED   08/03/2016 1807 08/13/2016 1323 Full Code [id number]  Pegge Toribio JINNY, PA-C Inpatient   08/03/2016 1807 08/03/2016 1807 Full Code [id number]  Pegge Toribio JINNY, PA-C Inpatient   07/18/2016 2307 08/03/2016 1802 Full Code [id number]  Blanca Elsie RAMAN, MD Inpatient   07/30/2015 1139 07/31/2015 2018 Full Code [id number]  Verlin Lonni BIRCH, MD Inpatient   07/29/2015 0705 07/30/2015 1139 Full Code [id number]  Mona Vinie BROCKS, MD Inpatient   07/03/2014 1222 07/04/2014 1349 Full Code [id number]  Claudene Victory ORN, MD Inpatient  10/29/2013 0258 11/02/2013 1958 Full Code [id number]  Ladora Coy, MD Inpatient         IV Access:   Peripheral IV   Procedures and diagnostic studies:   No results found.   Medical Consultants:   None.   Subjective:    Joshua [name] no complains  Objective:    Vitals:   06/02/24 1945 06/02/24 2017 06/03/24 0213 06/03/24 0740  BP: 122/72  113/73 112/70  Pulse: 83 80 80 84   Resp: 18 18 17    Temp: 98.7 F (37.1 C)  98.9 F (37.2 C) 98.2 F (36.8 C)  TempSrc: Oral  Oral   SpO2: 100%  100% 99%  Weight:      Height:       SpO2: 99 % O2 Flow Rate (L/min): 2 L/min FiO2 (%): 21 %   Intake/Output Summary (Last 24 hours) at 06/03/2024 0826 Last data filed at 06/03/2024 0300 Gross per 24 hour  Intake 1054 ml  Output 2720 ml  Net -1666 ml   Filed Weights   05/25/24 1332 05/31/24 0803  Weight: 109.3 kg 110.2 kg    Exam: General exam: In no acute distress. Respiratory system: Good air movement and clear to auscultation. Cardiovascular system: S1 & S2 heard, RRR. No JVD. Gastrointestinal system: Abdomen is nondistended, soft and nontender.  Extremities: Wound VAC in place Skin: No rashes, lesions or ulcers Psychiatry: Judgement and insight appear normal. Mood & affect appropriate. Data Reviewed:    Labs: Basic Metabolic Panel: Recent Labs  Lab 05/27/24 0858 05/28/24 9378 05/29/24 0604 05/30/24 0548 05/31/24 0527 06/01/24 0554  NA 138 134* 137 136 137  --   K 3.7 2.9* 3.8 3.6 3.9  --   CL 105 103 105 104 104  --   CO2 18* 22 22 20* 21*  --   GLUCOSE 143* 165* 122* 89 110*  --   BUN 70* 61* 53* 44* 42*  --   CREATININE 5.26* 4.82* 4.11* 3.56* 3.19* 3.03*  CALCIUM  9.0 8.5* 8.9 8.6* 8.5*  --   MG  --  2.1  --   --   --   --   PHOS  --  5.0*  --   --   --   --    GFR Estimated Creatinine Clearance: 29.7 mL/min (A) (by C-G formula based on SCr of 3.03 mg/dL (H)). Liver Function Tests: No results for input(s): AST, ALT, ALKPHOS, BILITOT, PROT, ALBUMIN in the last 168 hours.  No results for input(s): LIPASE, AMYLASE in the last 168 hours. No results for input(s): AMMONIA in the last 168 hours. Coagulation profile No results for input(s): INR, PROTIME in the last 168 hours. COVID-19 Labs  No results for input(s): DDIMER, FERRITIN, LDH, CRP in the last 72 hours.  Lab Results  Component Value Date    SARSCOV2NAA NEGATIVE 07/10/2023   SARSCOV2NAA NEGATIVE 09/25/2020   SARSCOV2NAA NEGATIVE 08/16/2019    CBC: Recent Labs  Lab 05/27/24 0858 05/29/24 0604 05/30/24 0548 05/31/24 0527  WBC 7.5 7.5 6.1 6.1  HGB 13.3 12.8* 12.0* 11.8*  HCT 41.9 39.4 37.3* 37.4*  MCV 91.7 89.5 90.5 91.7  PLT 129* 178 161 151   Cardiac Enzymes: No results for input(s): CKTOTAL, CKMB, CKMBINDEX, TROPONINI in the last 168 hours. BNP (last 3 results) No results for input(s): PROBNP in the last 8760 hours. CBG: Recent Labs  Lab 06/02/24 1125 06/02/24 1607 06/02/24 1740 06/02/24 2107 06/03/24 0639  GLUCAP 103* 77 81 179* 70  D-Dimer: No results for input(s): DDIMER in the last 72 hours. Hgb A1c: No results for input(s): HGBA1C in the last 72 hours. Lipid Profile: No results for input(s): CHOL, HDL, LDLCALC, TRIG, CHOLHDL, LDLDIRECT in the last 72 hours. Thyroid  function studies: No results for input(s): TSH, T4TOTAL, T3FREE, THYROIDAB in the last 72 hours.  Invalid input(s): FREET3 Anemia work up: No results for input(s): VITAMINB12, FOLATE, FERRITIN, TIBC, IRON, RETICCTPCT in the last 72 hours. Sepsis Labs: Recent Labs  Lab 05/27/24 0858 05/29/24 0604 05/30/24 0548 05/31/24 0527  WBC 7.5 7.5 6.1 6.1   Microbiology Recent Results (from the past 240 hours)  MRSA Next Gen by PCR, Nasal     Status: None   Collection Time: 05/30/24  8:16 AM   Specimen: Nasal Mucosa; Nasal Swab  Result Value Ref Range Status   MRSA by PCR Next Gen NOT DETECTED NOT DETECTED Final    Comment: (NOTE) The GeneXpert MRSA Assay (FDA approved for NASAL specimens only), is one component of a comprehensive MRSA colonization surveillance program. It is not intended to diagnose MRSA infection nor to guide or monitor treatment for MRSA infections. Test performance is not FDA approved in patients less than 48 years old. Performed at [facility], [address] [place], [place] [address]      Medications:    aspirin   81 mg Oral Daily   doxycycline   100 mg Oral Q12H   DULoxetine   20 mg Oral BID   enoxaparin  (LOVENOX ) injection  30 mg Subcutaneous Q24H   famotidine   10 mg Oral QHS   furosemide   40 mg Oral BID   gabapentin   100 mg Oral QHS   insulin  aspart  0-5 Units Subcutaneous QHS   insulin  aspart  0-6 Units Subcutaneous TID WC   insulin  aspart  2 Units Subcutaneous TID WC   insulin  glargine-yfgn  10 Units Subcutaneous BID   isosorbide  mononitrate  120 mg Oral Daily   latanoprost   1 drop Both Eyes QHS   metoprolol  succinate  100 mg Oral Daily   pantoprazole   40 mg Oral BID   rosuvastatin   10 mg Oral Daily   tamsulosin   0.4 mg Oral QPC supper   Continuous Infusions:  ampicillin -sulbactam (UNASYN ) IV 3 g (06/03/24 0208)      LOS: 9 days   Joshua [name]  [facility]  06/03/2024, 8:26 AM

## 2024-06-04 ENCOUNTER — Encounter (HOSPITAL_COMMUNITY)

## 2024-06-04 DIAGNOSIS — E11628 Type 2 diabetes mellitus with other skin complications: Secondary | ICD-10-CM | POA: Diagnosis not present

## 2024-06-04 DIAGNOSIS — L089 Local infection of the skin and subcutaneous tissue, unspecified: Secondary | ICD-10-CM | POA: Diagnosis not present

## 2024-06-04 LAB — GLUCOSE, CAPILLARY
Glucose-Capillary: 94 mg/dL (ref 70–99)
Glucose-Capillary: 134 mg/dL — ABNORMAL HIGH (ref 70–99)
Glucose-Capillary: 134 mg/dL — ABNORMAL HIGH (ref 70–99)
Glucose-Capillary: 116 mg/dL — ABNORMAL HIGH (ref 70–99)

## 2024-06-04 MED ORDER — INSULIN GLARGINE-YFGN 100 UNIT/ML ~~LOC~~ SOLN
2.0000 [IU] | Freq: Two times a day (BID) | SUBCUTANEOUS | Status: DC
  Administered 2024-06-04 – 2024-06-06 (×5): 2 [IU] via SUBCUTANEOUS
  Filled 2024-06-04 (×6): qty 0.02

## 2024-06-04 NOTE — Plan of Care (Signed)
  Problem: Metabolic: Goal: Ability to maintain appropriate glucose levels will improve Outcome: Not Progressing   Problem: Nutritional: Goal: Maintenance of adequate nutrition will improve Outcome: Not Progressing Goal: Progress toward achieving an optimal weight will improve Outcome: Not Progressing   Problem: Tissue Perfusion: Goal: Adequacy of tissue perfusion will improve Outcome: Not Progressing   Problem: Clinical Measurements: Goal: Will remain free from infection Outcome: Not Progressing   Problem: Coping: Goal: Level of anxiety will decrease Outcome: Not Progressing   Problem: Pain Managment: Goal: General experience of comfort will improve and/or be controlled Outcome: Not Progressing

## 2024-06-04 NOTE — Progress Notes (Signed)
 Inpatient Rehab Admissions Coordinator:    CIR following. Insurance case was voided without notice. Will see if OT can see today so that a new case can be opened.   Leita Kleine, MS, CCC-SLP Rehab Admissions Coordinator  [phone] (celll) [phone] (office)

## 2024-06-04 NOTE — Progress Notes (Addendum)
  Progress Note    06/04/2024 7:20 AM 4 Days Post-Op  Subjective:  pain is controlled.  Tm 99 now afebrile  Vitals:   06/03/24 2001 06/04/24 0418  BP: 125/75 129/66  Pulse: 79 72  Resp: 18 19  Temp: 99 F (37.2 C) 98.7 F (37.1 C)  SpO2: 100% 96%    Physical Exam: Incisions:  limb guard in place.  Minimal drainage in vac.    CBC    Component Value Date/Time   WBC 6.1 05/31/2024 0527   RBC 4.08 (L) 05/31/2024 0527   HGB 11.8 (L) 05/31/2024 0527   HGB 12.3 (L) 08/11/2021 0929   HCT 37.4 (L) 05/31/2024 0527   HCT 39.1 08/11/2021 0929   PLT 151 05/31/2024 0527   PLT 254 08/11/2021 0929   MCV 91.7 05/31/2024 0527   MCV 84 08/11/2021 0929   MCH 28.9 05/31/2024 0527   MCHC 31.6 05/31/2024 0527   RDW 15.1 05/31/2024 0527   RDW 15.9 (H) 08/11/2021 0929   LYMPHSABS 1.2 05/25/2024 1354   LYMPHSABS 2.0 08/11/2021 0929   MONOABS 1.3 (H) 05/25/2024 1354   EOSABS 0.0 05/25/2024 1354   EOSABS 0.2 08/11/2021 0929   BASOSABS 0.0 05/25/2024 1354   BASOSABS 0.0 08/11/2021 0929    BMET    Component Value Date/Time   NA 137 05/31/2024 0527   NA 141 10/26/2021 1044   K 3.9 05/31/2024 0527   CL 104 05/31/2024 0527   CO2 21 (L) 05/31/2024 0527   GLUCOSE 110 (H) 05/31/2024 0527   BUN 42 (H) 05/31/2024 0527   BUN 33 (H) 10/26/2021 1044   CREATININE 3.03 (H) 06/01/2024 0554   CREATININE 2.11 (H) 11/18/2022 1605   CALCIUM  8.5 (L) 05/31/2024 0527   GFRNONAA 22 (L) 06/01/2024 0554   GFRNONAA 51 (L) 11/01/2016 1012   GFRAA 27 (L) 08/23/2019 0340   GFRAA 58 (L) 11/01/2016 1012    INR    Component Value Date/Time   INR 1.1 07/10/2023 2018     Intake/Output Summary (Last 24 hours) at 06/04/2024 0720 Last data filed at 06/04/2024 0615 Gross per 24 hour  Intake 100 ml  Output 1640 ml  Net -1540 ml     Assessment/Plan:  68 y.o. male is s/p left below knee amputation   4 Days Post-Op   -pt pain well controlled -dressing off sometime this week-timing per per Dr.  Magda -being evaluated by CIR-would be good candidate. -he has f/u with [facility] on 7/15   Lucie Apt, PA-C [facility] [phone] 06/04/2024 7:20 AM     VASCULAR STAFF ADDENDUM: I have independently interviewed and examined the patient. I agree with the above.  Plan remove VAC 06/07/24 or on DC if discharge to another facility is planned.  Debby SAILOR. Magda, MD [facility] Phone Number: [phone] 06/04/2024 4:29 PM   '

## 2024-06-04 NOTE — Progress Notes (Addendum)
 [facility] PROGRESS NOTE    Progress Note  Joshua [name]  FMW:[id number] DOB: Oct 12, 1956 DOA: 05/25/2024 PCP: Wonda Worth SQUIBB, PA    Brief Narrative:   Joshua [name] is an 68 y.o. male past medical history of CAD with multiple PCI's, HFpEF, essential hypertension diabetes mellitus type 2 with peripheral neuropathy status post right metatarsal amputation 2017, with 40 left lower extremity healing wound, chronic kidney see stage IV, obesity, he relates he has been left lower extremity daily for years he follows up with ID and podiatry multiple rounds of antibiotics.  He has been offered right lower extremity amputation which he has refused.  Presented on the day of admission to the podiatrist office was sent to the ED with left foot diabetic ulcer with osteomyelitis. Assessment/Plan:   Left foot ulcer with osteomyelitis/ Diabetic foot infection [facility]): Podiatry was consulted recommended vascular surgery consult and orthopedic. Continue empirically on antibiotics,  doxycycline  and Unasyn . S/p left BKA on 05/31/2024. Further management per vascular surgery. PT evaluated the patient will need inpatient rehab, awaiting inpatient rehab admission  Acute kidney injury on chronic disease stage IV: Nephrology was consulted recommended to restart Lasix  and transition vancomycin  to Doxy. They have signed off. Avoid hypotension.  Metabolic acidosis: Likely due to chronic renal disease, now on sodium bicarb tabs. Now resolved.  Hypokalemia: Repleted now improved.  Anemia of chronic renal disease  Globin has remained relatively stable.  Diabetes mellitus type 2: Continue sliding scale insulin . Discontinue long-acting insulin   Essential hypertension: ARB has been discontinued, continue metoprolol , Lasix  and Imdur    DVT prophylaxis: Lovenox  Family Communication:none Status is: Inpatient Remains inpatient appropriate because: Diabetic foot ulcer infection    Code Status:      Code Status Orders  (From admission, onward)           Start     Ordered   05/25/24 1738  Full code  Continuous       Question:  By:  Answer:  Consent: discussion documented in EHR   05/25/24 1738           Code Status History     Date Active Date Inactive Code Status Order ID Comments User Context   07/10/2023 2224 07/13/2023 2313 Full Code [id number]  Marcene Eva NOVAK, DO ED   11/26/2022 0242 12/10/2022 0140 Full Code [id number]  Marcene Eva NOVAK, DO ED   11/06/2022 0106 11/16/2022 2122 Full Code [id number]  Tobie Jorie SAUNDERS, MD ED   09/25/2020 0128 09/28/2020 1948 Full Code [id number]  Chotiner, Adine RAMAN, MD ED   08/16/2019 2048 08/23/2019 1620 Full Code [id number]  Donnamarie Lebron JINNY, MD Inpatient   04/06/2019 2342 04/10/2019 1818 Full Code [id number]  Merline Lares, MD Inpatient   03/15/2018 0011 03/22/2018 1936 Full Code [id number]  Alm Maxwell LABOR, MD ED   05/19/2017 1908 05/23/2017 1549 Full Code [id number]  Charlton Evalene RAMAN, MD ED   10/11/2016 1645 10/14/2016 0025 Full Code [id number]  Maxie Herlene POUR, PA-C ED   08/03/2016 1807 08/13/2016 1323 Full Code [id number]  Pegge Toribio JINNY, PA-C Inpatient   08/03/2016 1807 08/03/2016 1807 Full Code [id number]  Pegge Toribio JINNY, PA-C Inpatient   07/18/2016 2307 08/03/2016 1802 Full Code [id number]  Blanca Elsie RAMAN, MD Inpatient   07/30/2015 1139 07/31/2015 2018 Full Code [id number]  Verlin Lonni BIRCH, MD Inpatient   07/29/2015 0705 07/30/2015 1139 Full Code [id number]  Mona Vinie BROCKS, MD Inpatient   07/03/2014 1222 07/04/2014 1349 Full Code [id number]  Claudene Victory ORN,  MD Inpatient   10/29/2013 0258 11/02/2013 1958 Full Code [id number]  Ladora Coy, MD Inpatient         IV Access:   Peripheral IV   Procedures and diagnostic studies:   No results found.   Medical Consultants:   None.   Subjective:    Joshua [name] no complains  Objective:    Vitals:   06/03/24 1500 06/03/24 2001 06/04/24 0418 06/04/24 0808  BP: 127/68  125/75 129/66 137/75  Pulse: 79 79 72 79  Resp: 19 18 19    Temp: 98.5 F (36.9 C) 99 F (37.2 C) 98.7 F (37.1 C) 98.2 F (36.8 C)  TempSrc: Oral Oral Oral   SpO2: 99% 100% 96% 99%  Weight:      Height:       SpO2: 99 % O2 Flow Rate (L/min): 2 L/min FiO2 (%): 21 %   Intake/Output Summary (Last 24 hours) at 06/04/2024 0811 Last data filed at 06/04/2024 0615 Gross per 24 hour  Intake 100 ml  Output 1640 ml  Net -1540 ml   Filed Weights   05/25/24 1332 05/31/24 0803  Weight: 109.3 kg 110.2 kg    Exam: General exam: In no acute distress. Respiratory system: Good air movement and clear to auscultation. Cardiovascular system: S1 & S2 heard, RRR. No JVD. Gastrointestinal system: Abdomen is nondistended, soft and nontender.  Extremities: Wound VAC in place Skin: No rashes, lesions or ulcers Psychiatry: Judgement and insight appear normal. Mood & affect appropriate. Data Reviewed:    Labs: Basic Metabolic Panel: Recent Labs  Lab 05/29/24 0604 05/30/24 0548 05/31/24 0527 06/01/24 0554  NA 137 136 137  --   K 3.8 3.6 3.9  --   CL 105 104 104  --   CO2 22 20* 21*  --   GLUCOSE 122* 89 110*  --   BUN 53* 44* 42*  --   CREATININE 4.11* 3.56* 3.19* 3.03*  CALCIUM  8.9 8.6* 8.5*  --    GFR Estimated Creatinine Clearance: 29.7 mL/min (A) (by C-G formula based on SCr of 3.03 mg/dL (H)). Liver Function Tests: No results for input(s): AST, ALT, ALKPHOS, BILITOT, PROT, ALBUMIN in the last 168 hours.  No results for input(s): LIPASE, AMYLASE in the last 168 hours. No results for input(s): AMMONIA in the last 168 hours. Coagulation profile No results for input(s): INR, PROTIME in the last 168 hours. COVID-19 Labs  No results for input(s): DDIMER, FERRITIN, LDH, CRP in the last 72 hours.  Lab Results  Component Value Date   SARSCOV2NAA NEGATIVE 07/10/2023   SARSCOV2NAA NEGATIVE 09/25/2020   SARSCOV2NAA NEGATIVE 08/16/2019     CBC: Recent Labs  Lab 05/29/24 0604 05/30/24 0548 05/31/24 0527  WBC 7.5 6.1 6.1  HGB 12.8* 12.0* 11.8*  HCT 39.4 37.3* 37.4*  MCV 89.5 90.5 91.7  PLT 178 161 151   Cardiac Enzymes: No results for input(s): CKTOTAL, CKMB, CKMBINDEX, TROPONINI in the last 168 hours. BNP (last 3 results) No results for input(s): PROBNP in the last 8760 hours. CBG: Recent Labs  Lab 06/03/24 0639 06/03/24 1126 06/03/24 1556 06/03/24 2105 06/04/24 0613  GLUCAP 70 139* 146* 140* 94   D-Dimer: No results for input(s): DDIMER in the last 72 hours. Hgb A1c: No results for input(s): HGBA1C in the last 72 hours. Lipid Profile: No results for input(s): CHOL, HDL, LDLCALC, TRIG, CHOLHDL, LDLDIRECT in the last 72 hours. Thyroid  function studies: No results for input(s): TSH, T4TOTAL, T3FREE, THYROIDAB in the last 72  hours.  Invalid input(s): FREET3 Anemia work up: No results for input(s): VITAMINB12, FOLATE, FERRITIN, TIBC, IRON, RETICCTPCT in the last 72 hours. Sepsis Labs: Recent Labs  Lab 05/29/24 0604 05/30/24 0548 05/31/24 0527  WBC 7.5 6.1 6.1   Microbiology Recent Results (from the past 240 hours)  MRSA Next Gen by PCR, Nasal     Status: None   Collection Time: 05/30/24  8:16 AM   Specimen: Nasal Mucosa; Nasal Swab  Result Value Ref Range Status   MRSA by PCR Next Gen NOT DETECTED NOT DETECTED Final    Comment: (NOTE) The GeneXpert MRSA Assay (FDA approved for NASAL specimens only), is one component of a comprehensive MRSA colonization surveillance program. It is not intended to diagnose MRSA infection nor to guide or monitor treatment for MRSA infections. Test performance is not FDA approved in patients less than 65 years old. Performed at [facility], [address] [place], [place] [address]      Medications:    aspirin   81 mg Oral Daily   doxycycline   100 mg Oral Q12H   DULoxetine   20 mg Oral BID    enoxaparin  (LOVENOX ) injection  30 mg Subcutaneous Q24H   famotidine   10 mg Oral QHS   furosemide   40 mg Oral BID   gabapentin   100 mg Oral QHS   insulin  aspart  0-5 Units Subcutaneous QHS   insulin  aspart  0-6 Units Subcutaneous TID WC   insulin  aspart  2 Units Subcutaneous TID WC   insulin  glargine-yfgn  2 Units Subcutaneous BID   isosorbide  mononitrate  120 mg Oral Daily   latanoprost   1 drop Both Eyes QHS   metoprolol  succinate  100 mg Oral Daily   pantoprazole   40 mg Oral BID   rosuvastatin   10 mg Oral Daily   tamsulosin   0.4 mg Oral QPC supper   Continuous Infusions:  ampicillin -sulbactam (UNASYN ) IV 3 g (06/04/24 0134)      LOS: 10 days   Joshua [name]  [facility]  06/04/2024, 8:11 AM

## 2024-06-04 NOTE — Progress Notes (Signed)
 RT NOTE: RT to room for ordered CPAP. PT states he is not ready to go on CPAP yet. RT told PT to call when he is ready to go on CPAP for the night.

## 2024-06-04 NOTE — Progress Notes (Signed)
 Physical Therapy Treatment Patient Details Name: Joshua [name] MRN: [id number] DOB: Nov 17, 1956 Today's Date: 06/04/2024   History of Present Illness Pt is a 64 M s/p L BKA on 05/31/24 resulting from diabetic foot infection. PMH includes CAD with multiple PCI's, HFpEF, essential hypertension diabetes mellitus type 2 with peripheral neuropathy, status post R metatarsal amputation 2017, with 40 left lower extremity healing wound, chronic kidney see stage IV, obesity    PT Comments  Pt tolerated mobility progressions well, mobilizing short room level distances w/ RW and no physical assistance. Pt had one instance of R knee buckling, and multiple instances of instability, but was able to maintain upright w/out physical assistance. Pt was educated on the importance of maintaining knee extension in L knee for optimal alignment with future prosthetic via exercises and positioning, and educated on the role of acute care vs post discharge therapies in his rehab journey. PT will continue to treat pt while he is admitted. Pt would benefit from further gait training, stair training, and LE strengthening to facilitate improved alignment. In order to safely discharge home, pt needs to be mod I w/ all mobility and can safely negotiate 4 steps. Patient will benefit from intensive inpatient follow-up therapy, >3 hours/day.     If plan is discharge home, recommend the following: A little help with walking and/or transfers;A little help with bathing/dressing/bathroom;Assistance with cooking/housework;Assist for transportation;Help with stairs or ramp for entrance   Can travel by private vehicle        Equipment Recommendations  Wheelchair (measurements PT);Wheelchair cushion (measurements PT)    Recommendations for Other Services       Precautions / Restrictions Precautions Precautions: Fall Recall of Precautions/Restrictions: Intact Restrictions Weight Bearing Restrictions Per Provider Order: Yes LLE  Weight Bearing Per Provider Order: Non weight bearing     Mobility  Bed Mobility Overal bed mobility: Needs Assistance Bed Mobility: Supine to Sit     Supine to sit: Supervision, HOB elevated     General bed mobility comments: Increased time to complete. Pt left in recliner at end of session    Transfers Overall transfer level: Needs assistance Equipment used: Rolling walker (2 wheels) Transfers: Sit to/from Stand, Bed to chair/wheelchair/BSC Sit to Stand: Contact guard assist   Step pivot transfers: Contact guard assist       General transfer comment: Pt completed STS w/ RW and no physical assistance and hop-pivot transfer w/ RW and no assistance. VC given for sequencing; increased time to complete.    Ambulation/Gait Ambulation/Gait assistance: Contact guard assist Gait Distance (Feet): 32 Feet Assistive device: Rolling walker (2 wheels) Gait Pattern/deviations: Step-to pattern, Decreased stride length, Decreased step length - right Gait velocity: reduced Gait velocity interpretation: <1.8 ft/sec, indicate of risk for recurrent falls   General Gait Details: Pt utilizies hop-to gait pattern and is reliant on external device for maintaining upright. Pt had one instance of R knee buckling and multiple instances of instability, but did not require physical assistance for correcting or maintaining upright.   Stairs             Wheelchair Mobility     Tilt Bed    Modified Rankin (Stroke Patients Only)       Balance Overall balance assessment: Needs assistance Sitting-balance support: No upper extremity supported, Feet supported Sitting balance-Leahy Scale: Good Sitting balance - Comments: seated EOB w/out BUE support   Standing balance support: Bilateral upper extremity supported, Reliant on assistive device for balance, During functional activity Standing balance-Leahy  Scale: Poor Standing balance comment: reliant on external support                             Communication Communication Communication: No apparent difficulties  Cognition Arousal: Alert Behavior During Therapy: WFL for tasks assessed/performed   PT - Cognitive impairments: No apparent impairments                         Following commands: Intact      Cueing Cueing Techniques: Verbal cues, Visual cues  Exercises      General Comments General comments (skin integrity, edema, etc.): no signs of acute distress      Pertinent Vitals/Pain Pain Assessment Pain Assessment: Faces Faces Pain Scale: Hurts a little bit Pain Location: L LE Pain Descriptors / Indicators: Discomfort, Grimacing, Guarding Pain Intervention(s): Limited activity within patient's tolerance, Monitored during session    Home Living                          Prior Function            PT Goals (current goals can now be found in the care plan section) Acute Rehab PT Goals Patient Stated Goal: to be as independent as possible PT Goal Formulation: With patient Time For Goal Achievement: 06/15/24 Potential to Achieve Goals: Good Progress towards PT goals: Progressing toward goals    Frequency    Min 2X/week      PT Plan      Co-evaluation              AM-PAC PT 6 Clicks Mobility   Outcome Measure  Help needed turning from your back to your side while in a flat bed without using bedrails?: A Little Help needed moving from lying on your back to sitting on the side of a flat bed without using bedrails?: A Little Help needed moving to and from a bed to a chair (including a wheelchair)?: A Little Help needed standing up from a chair using your arms (e.g., wheelchair or bedside chair)?: A Little Help needed to walk in hospital room?: A Little Help needed climbing 3-5 steps with a railing? : Total 6 Click Score: 16    End of Session Equipment Utilized During Treatment: Gait belt;Other (comment) (L limb guard) Activity Tolerance: Patient  tolerated treatment well Patient left: in chair;with call bell/phone within reach;with chair alarm set Nurse Communication: Mobility status PT Visit Diagnosis: Unsteadiness on feet (R26.81);Other abnormalities of gait and mobility (R26.89);Muscle weakness (generalized) (M62.81)     Time: 8874-8856 PT Time Calculation (min) (ACUTE ONLY): 18 min  Charges:    $Therapeutic Activity: 8-22 mins PT General Charges $$ ACUTE PT VISIT: 1 Visit                     Leontine Hilt, SPT Acute Rehab [phone]    Leontine Hilt 06/04/2024, 12:02 PM

## 2024-06-04 NOTE — Progress Notes (Signed)
 Occupational Therapy Treatment Patient Details Name: Joshua [name] MRN: [id number] DOB: 03-06-1956 Today's Date: 06/04/2024   History of present illness Pt is a 71 M s/p L BKA on 05/31/24 resulting from diabetic foot infection. PMH includes CAD with multiple PCI's, HFpEF, essential hypertension diabetes mellitus type 2 with peripheral neuropathy, status post R metatarsal amputation 2017, with 40 left lower extremity healing wound, chronic kidney see stage IV, obesity   OT comments  Pt making good progress with functional goals. Pt seated in recliner upon OT arrival this session. Sit - stand from recliner with RW and hop-pivot transfer RW, min verbal cues for correct hand placemment and for sequencing; increased time to complete. Pt educated on LB ADL compensatory techniques as well tub bench for home use. Pt verbalize understanding and able to return demos LB ADLs with min A seated EOB. Pt will benefit from intensive inpatient follow-up therapy, >3 hours/day. OT will continue to follow acutely to maximize level of function and safety      If plan is discharge home, recommend the following:  A little help with walking and/or transfers;Assistance with cooking/housework;Assist for transportation;Help with stairs or ramp for entrance;A little help with bathing/dressing/bathroom   Equipment Recommendations  Tub/shower bench;BSC/3in1    Recommendations for Other Services      Precautions / Restrictions Precautions Precautions: Fall Recall of Precautions/Restrictions: Intact Precaution/Restrictions Comments: R transmet amputation; limb guard on when OOB Restrictions Weight Bearing Restrictions Per Provider Order: Yes LLE Weight Bearing Per Provider Order: Non weight bearing       Mobility Bed Mobility               General bed mobility comments: pt in recliner upon OT arrival    Transfers Overall transfer level: Needs assistance Equipment used: Rolling walker (2  wheels) Transfers: Sit to/from Stand, Bed to chair/wheelchair/BSC Sit to Stand: Contact guard assist     Step pivot transfers: Contact guard assist     General transfer comment: Sit - stand from recliner with RW and hop-pivot transfer RW, min verbal cues for correct hand placemment and for sequencing; increased time to complete     Balance Overall balance assessment: Needs assistance Sitting-balance support: No upper extremity supported, Feet supported Sitting balance-Leahy Scale: Good Sitting balance - Comments: seated EOB w/out BUE support   Standing balance support: Bilateral upper extremity supported, Reliant on assistive device for balance, During functional activity Standing balance-Leahy Scale: Poor Standing balance comment: reliant on external support                           ADL either performed or assessed with clinical judgement   ADL Overall ADL's : Needs assistance/impaired             Lower Body Bathing: Minimal assistance;Sitting/lateral leans;Cueing for compensatory techniques       Lower Body Dressing: Minimal assistance;Sitting/lateral leans;Cueing for compensatory techniques   Toilet Transfer: Minimal assistance;Contact guard assist;Rolling walker (2 wheels);Cueing for safety;Cueing for sequencing   Toileting- Clothing Manipulation and Hygiene: Minimal assistance;Sitting/lateral lean       Functional mobility during ADLs: Minimal assistance;Contact guard assist;Rolling walker (2 wheels);Cueing for safety;Cueing for sequencing General ADL Comments: pt educated on LB ADL compensatory techniques    Extremity/Trunk Assessment Upper Extremity Assessment Upper Extremity Assessment: Right hand dominant;RUE deficits/detail;LUE deficits/detail RUE Deficits / Details: decreased fine motor coordination; gross hand strength 3/5; hx of partial 5th digit amputation; pt reports occasional burning sensation in 4th  digit not present this session;  strength, ROM, sensation, and gross motor coordinaiton otherwise WFL RUE Coordination: decreased fine motor   Lower Extremity Assessment Lower Extremity Assessment: Defer to PT evaluation   Cervical / Trunk Assessment Cervical / Trunk Assessment: Normal    Vision Ability to See in Adequate Light: 0 Adequate Patient Visual Report: No change from baseline     Perception     Praxis     Communication Communication Communication: No apparent difficulties   Cognition Arousal: Alert Behavior During Therapy: WFL for tasks assessed/performed                                 Following commands: Intact        Cueing   Cueing Techniques: Verbal cues, Visual cues  Exercises      Shoulder Instructions       General Comments no signs of acute distress    Pertinent Vitals/ Pain       Pain Assessment Pain Assessment: Faces Faces Pain Scale: Hurts a little bit Pain Location: L LE Pain Descriptors / Indicators: Discomfort, Grimacing, Guarding Pain Intervention(s): Monitored during session, Repositioned  Home Living                                          Prior Functioning/Environment              Frequency  Min 2X/week        Progress Toward Goals  OT Goals(current goals can now be found in the care plan section)  Progress towards OT goals: Progressing toward goals     Plan      Co-evaluation                 AM-PAC OT 6 Clicks Daily Activity     Outcome Measure   Help from another person eating meals?: None Help from another person taking care of personal grooming?: A Little Help from another person toileting, which includes using toliet, bedpan, or urinal?: A Little Help from another person bathing (including washing, rinsing, drying)?: A Little Help from another person to put on and taking off regular upper body clothing?: A Little Help from another person to put on and taking off regular lower body clothing?:  A Little 6 Click Score: 19    End of Session Equipment Utilized During Treatment: Gait belt;Rolling walker (2 wheels);Other (comment) (wound VAC)  OT Visit Diagnosis: Unsteadiness on feet (R26.81);Other abnormalities of gait and mobility (R26.89);Muscle weakness (generalized) (M62.81)   Activity Tolerance Patient tolerated treatment well   Patient Left with call bell/phone within reach;in chair;with chair alarm set   Nurse Communication Mobility status        Time: 8643-8584 OT Time Calculation (min): 19 min  Charges: OT General Charges $OT Visit: 1 Visit OT Treatments $Self Care/Home Management : 8-22 mins    Jacques Karna Loose 06/04/2024, 2:27 PM

## 2024-06-04 NOTE — Progress Notes (Signed)
 RT NOTE: RT to room for scheduled CPAP order. PT says not ready to go on CPAP yet- does not know what time he will want to go on tonight. RT told PT to call when ready to go on CPAP for the night.

## 2024-06-05 DIAGNOSIS — E11628 Type 2 diabetes mellitus with other skin complications: Secondary | ICD-10-CM | POA: Diagnosis not present

## 2024-06-05 DIAGNOSIS — L089 Local infection of the skin and subcutaneous tissue, unspecified: Secondary | ICD-10-CM | POA: Diagnosis not present

## 2024-06-05 LAB — GLUCOSE, CAPILLARY
Glucose-Capillary: 104 mg/dL — ABNORMAL HIGH (ref 70–99)
Glucose-Capillary: 137 mg/dL — ABNORMAL HIGH (ref 70–99)
Glucose-Capillary: 279 mg/dL — ABNORMAL HIGH (ref 70–99)
Glucose-Capillary: 198 mg/dL — ABNORMAL HIGH (ref 70–99)

## 2024-06-05 NOTE — Progress Notes (Signed)
 [facility] PROGRESS NOTE    Progress Note  BRASEN BUNDREN  FMW:[id number] DOB: 25-Jan-1956 DOA: 05/25/2024 PCP: Wonda Worth SQUIBB, PA    Brief Narrative:   Joshua [name] is an 68 y.o. male past medical history of CAD with multiple PCI's, HFpEF, essential hypertension diabetes mellitus type 2 with peripheral neuropathy status post right metatarsal amputation 2017, with 40 left lower extremity healing wound, chronic kidney see stage IV, obesity, he relates he has been left lower extremity daily for years he follows up with ID and podiatry multiple rounds of antibiotics.  He has been offered right lower extremity amputation which he has refused.  Presented on the day of admission to the podiatrist office was sent to the ED with left foot diabetic ulcer with osteomyelitis. Assessment/Plan:   Left foot ulcer with osteomyelitis/ Diabetic foot infection [facility]): Podiatry was consulted recommended vascular surgery consult and orthopedic. S/p left BKA on 05/31/2024.  No further antibiotics needed. Further management per vascular surgery. PT evaluated the patient will need inpatient rehab, awaiting inpatient rehab admission. Pending insurance case was voided without notice continue to evaluate today to resubmit  Acute kidney injury on chronic disease stage IV: Nephrology was consulted recommended to restart Lasix  and transition vancomycin  to Doxy. They have signed off. Avoid hypotension.  Metabolic acidosis: Likely due to chronic renal disease, now on sodium bicarb tabs. Now resolved.  Hypokalemia: Repleted now improved.  Anemia of chronic renal disease  Globin has remained relatively stable.  Diabetes mellitus type 2: Continue sliding scale insulin . Discontinue long-acting insulin   Essential hypertension: ARB has been discontinued, continue metoprolol , Lasix  and Imdur    DVT prophylaxis: Lovenox  Family Communication:none Status is: Inpatient Remains inpatient appropriate  because: Diabetic foot ulcer infection    Code Status:     Code Status Orders  (From admission, onward)           Start     Ordered   05/25/24 1738  Full code  Continuous       Question:  By:  Answer:  Consent: discussion documented in EHR   05/25/24 1738           Code Status History     Date Active Date Inactive Code Status Order ID Comments User Context   07/10/2023 2224 07/13/2023 2313 Full Code [id number]  Marcene Eva NOVAK, DO ED   11/26/2022 0242 12/10/2022 0140 Full Code [id number]  Marcene Eva NOVAK, DO ED   11/06/2022 0106 11/16/2022 2122 Full Code [id number]  Tobie Jorie SAUNDERS, MD ED   09/25/2020 0128 09/28/2020 1948 Full Code [id number]  Chotiner, Adine RAMAN, MD ED   08/16/2019 2048 08/23/2019 1620 Full Code [id number]  Donnamarie Lebron JINNY, MD Inpatient   04/06/2019 2342 04/10/2019 1818 Full Code [id number]  Merline Lares, MD Inpatient   03/15/2018 0011 03/22/2018 1936 Full Code [id number]  Alm Maxwell LABOR, MD ED   05/19/2017 1908 05/23/2017 1549 Full Code [id number]  Charlton Evalene RAMAN, MD ED   10/11/2016 1645 10/14/2016 0025 Full Code [id number]  Maxie Herlene POUR, PA-C ED   08/03/2016 1807 08/13/2016 1323 Full Code [id number]  Pegge Toribio JINNY, PA-C Inpatient   08/03/2016 1807 08/03/2016 1807 Full Code [id number]  Pegge Toribio JINNY, PA-C Inpatient   07/18/2016 2307 08/03/2016 1802 Full Code [id number]  Blanca Elsie RAMAN, MD Inpatient   07/30/2015 1139 07/31/2015 2018 Full Code [id number]  Verlin Lonni BIRCH, MD Inpatient   07/29/2015 0705 07/30/2015 1139 Full Code [id number]  Mona Vinie BROCKS, MD Inpatient   07/03/2014  1222 07/04/2014 1349 Full Code [id number]  Claudene Victory ORN, MD Inpatient   10/29/2013 0258 11/02/2013 1958 Full Code [id number]  Ladora Coy, MD Inpatient         IV Access:   Peripheral IV   Procedures and diagnostic studies:   No results found.   Medical Consultants:   None.   Subjective:    Joshua [name] no complains  Objective:    Vitals:   06/04/24 1650  06/04/24 1935 06/05/24 0440 06/05/24 0739  BP: 125/73 127/77 117/70 130/69  Pulse: 93 88 78 80  Resp: 17 16 16    Temp: 99.2 F (37.3 C) 98.8 F (37.1 C) 98.5 F (36.9 C) 98.2 F (36.8 C)  TempSrc:      SpO2: 100% 100% 98% 99%  Weight:      Height:       SpO2: 99 % O2 Flow Rate (L/min): 2 L/min FiO2 (%): 21 %   Intake/Output Summary (Last 24 hours) at 06/05/2024 0916 Last data filed at 06/05/2024 0500 Gross per 24 hour  Intake 120 ml  Output 1000 ml  Net -880 ml   Filed Weights   05/25/24 1332 05/31/24 0803  Weight: 109.3 kg 110.2 kg    Exam: General exam: In no acute distress. Respiratory system: Good air movement and clear to auscultation. Cardiovascular system: S1 & S2 heard, RRR. No JVD. Gastrointestinal system: Abdomen is nondistended, soft and nontender.  Extremities: Wound VAC in place Skin: No rashes, lesions or ulcers Psychiatry: Judgement and insight appear normal. Mood & affect appropriate. Data Reviewed:    Labs: Basic Metabolic Panel: Recent Labs  Lab 05/30/24 0548 05/31/24 0527 06/01/24 0554  NA 136 137  --   K 3.6 3.9  --   CL 104 104  --   CO2 20* 21*  --   GLUCOSE 89 110*  --   BUN 44* 42*  --   CREATININE 3.56* 3.19* 3.03*  CALCIUM  8.6* 8.5*  --    GFR Estimated Creatinine Clearance: 29.7 mL/min (A) (by C-G formula based on SCr of 3.03 mg/dL (H)). Liver Function Tests: No results for input(s): AST, ALT, ALKPHOS, BILITOT, PROT, ALBUMIN in the last 168 hours.  No results for input(s): LIPASE, AMYLASE in the last 168 hours. No results for input(s): AMMONIA in the last 168 hours. Coagulation profile No results for input(s): INR, PROTIME in the last 168 hours. COVID-19 Labs  No results for input(s): DDIMER, FERRITIN, LDH, CRP in the last 72 hours.  Lab Results  Component Value Date   SARSCOV2NAA NEGATIVE 07/10/2023   SARSCOV2NAA NEGATIVE 09/25/2020   SARSCOV2NAA NEGATIVE 08/16/2019    CBC: Recent  Labs  Lab 05/30/24 0548 05/31/24 0527  WBC 6.1 6.1  HGB 12.0* 11.8*  HCT 37.3* 37.4*  MCV 90.5 91.7  PLT 161 151   Cardiac Enzymes: No results for input(s): CKTOTAL, CKMB, CKMBINDEX, TROPONINI in the last 168 hours. BNP (last 3 results) No results for input(s): PROBNP in the last 8760 hours. CBG: Recent Labs  Lab 06/04/24 0613 06/04/24 1201 06/04/24 1720 06/04/24 2159 06/05/24 0652  GLUCAP 94 134* 134* 116* 104*   D-Dimer: No results for input(s): DDIMER in the last 72 hours. Hgb A1c: No results for input(s): HGBA1C in the last 72 hours. Lipid Profile: No results for input(s): CHOL, HDL, LDLCALC, TRIG, CHOLHDL, LDLDIRECT in the last 72 hours. Thyroid  function studies: No results for input(s): TSH, T4TOTAL, T3FREE, THYROIDAB in the last 72 hours.  Invalid input(s): FREET3 Anemia work  up: No results for input(s): VITAMINB12, FOLATE, FERRITIN, TIBC, IRON, RETICCTPCT in the last 72 hours. Sepsis Labs: Recent Labs  Lab 05/30/24 0548 05/31/24 0527  WBC 6.1 6.1   Microbiology Recent Results (from the past 240 hours)  MRSA Next Gen by PCR, Nasal     Status: None   Collection Time: 05/30/24  8:16 AM   Specimen: Nasal Mucosa; Nasal Swab  Result Value Ref Range Status   MRSA by PCR Next Gen NOT DETECTED NOT DETECTED Final    Comment: (NOTE) The GeneXpert MRSA Assay (FDA approved for NASAL specimens only), is one component of a comprehensive MRSA colonization surveillance program. It is not intended to diagnose MRSA infection nor to guide or monitor treatment for MRSA infections. Test performance is not FDA approved in patients less than 69 years old. Performed at [facility], [address] [place], [place] [address]      Medications:    aspirin   81 mg Oral Daily   DULoxetine   20 mg Oral BID   enoxaparin  (LOVENOX ) injection  30 mg Subcutaneous Q24H   famotidine   10 mg Oral QHS   furosemide   40 mg Oral BID    gabapentin   100 mg Oral QHS   insulin  aspart  0-5 Units Subcutaneous QHS   insulin  aspart  0-6 Units Subcutaneous TID WC   insulin  aspart  2 Units Subcutaneous TID WC   insulin  glargine-yfgn  2 Units Subcutaneous BID   isosorbide  mononitrate  120 mg Oral Daily   latanoprost   1 drop Both Eyes QHS   metoprolol  succinate  100 mg Oral Daily   pantoprazole   40 mg Oral BID   rosuvastatin   10 mg Oral Daily   tamsulosin   0.4 mg Oral QPC supper   Continuous Infusions:      LOS: 11 days   Joshua [name]  [facility]  06/05/2024, 9:16 AM

## 2024-06-05 NOTE — Progress Notes (Signed)
 Mobility Specialist Progress Note:    06/05/24 0900  Mobility  Activity Ambulated with assistance in room  Level of Assistance Contact guard assist, steadying assist  Assistive Device Front wheel walker  Distance Ambulated (ft) 40 ft  LLE Weight Bearing Per Provider Order NWB  Activity Response Tolerated well  Mobility Referral Yes  Mobility visit 1 Mobility  Mobility Specialist Start Time (ACUTE ONLY) 0933  Mobility Specialist Stop Time (ACUTE ONLY) 0939  Mobility Specialist Time Calculation (min) (ACUTE ONLY) 6 min   Pt received in chair and agreeable. Required only contact guard throughout. No buckling noted. Asymptomatic w/ no complaints. Pt left in chair with call bell and all needs met.  D'Vante Nicholaus Mobility Specialist Please contact via Special educational needs teacher or Rehab office at [phone]

## 2024-06-05 NOTE — H&P (Incomplete)
 Physical Medicine and Rehabilitation Admission H&P    Chief Complaint  Patient presents with   Wound Infection  : HPI: Joshua [name] is a 68 year old right-handed male with history of CAD status post multiple PCI's maintained on low-dose aspirin , diastolic congestive heart failure, hypertension, hyperlipidemia, diabetes mellitus with neuropathy, chronic back pain, CKD stage III, depression, class II obesity BMI 35.88, status post right TMA 2017 and did receive CIR as well as chronic poorly healing diabetic left lower extremity foot ulcer/osteomyelitis with multiple I&D's followed by podiatry recently completing course of p.o. antibiotics.  Patient has been offered BKA surgical intervention in the past but deferred.  Per chart review lives with son, son's fianc.  1 level home 4 steps entry.  Used a straight point cane for mobility.  Presented 05/25/2024 with nonhealing left diabetic foot with increasing drainage and rest pain.  Evaluated by vascular surgery with recent MRI of the left foot showing markedly progressive and severe midfoot degenerative disease likely neuropathic disease with erosive changes, destructive bony changes joint space narrowing midfoot collapse and pes planus.  Diffuse cellulitis without discrete focal fluid collection to suggest a drainable abscess.  Fatty atrophy of the foot musculature and moderate muscle edema suggesting mild fasciitis.  Wound was not felt to be salvageable and underwent left BKA 05/31/2024 per Dr. Magda.  Patient has completed empiric course of doxycycline  and Unasyn .   Placed on Lovenox  for DVT prophylaxis.  Aspirin  as prior to admission has been resumed.  Nephrology follow-up for CKD with baseline creatinine mid to low 2s based on recent data peaking at 5.57 and followed by Dr. Gearline at [facility] with latest creatinine 3.19 and initially Lasix  held and has since been resumed at the recommendations of nephrology services.  Therapy evaluations  completed due to patient decreased functional mobility was admitted for a comprehensive rehab program.  Review of Systems  Constitutional:  Negative for chills and fever.  HENT:  Negative for hearing loss.   Eyes:  Negative for blurred vision and double vision.  Respiratory:  Negative for cough, shortness of breath and wheezing.   Cardiovascular:  Positive for leg swelling. Negative for chest pain and palpitations.  Gastrointestinal:  Positive for constipation.       GERD  Genitourinary:  Positive for frequency and urgency. Negative for dysuria, flank pain and hematuria.  Musculoskeletal:  Positive for back pain, joint pain and myalgias.  Skin:  Negative for rash.  Psychiatric/Behavioral:  Positive for depression.   All other systems reviewed and are negative.  Past Medical History:  Diagnosis Date   Acute coronary syndrome (HCC)    Anemia    B12 deficiency    Chest pain    CHF (congestive heart failure) (HCC)    Chronic combined systolic and diastolic CHF (congestive heart failure) (HCC)    a. 07/2015 Echo: EF 45-50%, mod LVH, mid apical/antsept AK, Gr 1 DD.   Chronic low back pain    CKD (chronic kidney disease), stage III (HCC)    Constipation    COPD (chronic obstructive pulmonary disease) (HCC)    Coronary artery disease    a. 2012 s/p MI/PCI: s/p DES to mid/dist RCA and overlapping DES to LAD;  b. 07/2015 Cath: LAD 10% ISR, D1 40(jailed), LCX 49m, OM2 30, OM3 30, RCA 62m ISR, 10d ISR; c. 07/2016 NSTEMI/PCI: LAD 85ost/p/m (3.5x20 Synergy DES overlapping prior stent), D1 40, LCX 55m, OM2 95 (2.75x16 Synergy DES), OM3 30, RCA 61m, 10d.   Depression  Diabetic gastroparesis (HCC)    Diabetic polyneuropathy (HCC)    DKA (diabetic ketoacidoses) 03/14/2018   GERD (gastroesophageal reflux disease)    Gout    Heart failure (HCC)    Heart murmur    History of MI (myocardial infarction)    Hyperlipemia    Hypertension    Hypertensive heart disease    Hypokalemia    Ischemic  cardiomyopathy    a. 07/2015 Echo: EF 45-50%, mod LVH, mid-apicalanteroseptal AK, Gr1 DD.   Joint pain    Kidney problem    Morbid obesity (HCC)    OSA on CPAP    Osteoarthritis    Osteomyelitis (HCC)    a. 07/2016 MTP joint of R great toe s/p transmetatarsal amputation.   Other fatigue    Polyneuropathy in diabetes(357.2)    Pulmonary sarcoidosis (HCC)    problems w/it years ago; no problems anymore (07/03/2014)   Rectal bleeding    Shortness of breath    Shortness of breath on exertion    Sleep apnea    Stomach ulcer    Swallowing difficulty    Type II diabetes mellitus (HCC)    Past Surgical History:  Procedure Laterality Date   ABDOMINAL AORTOGRAM W/LOWER EXTREMITY N/A 11/29/2022   Procedure: ABDOMINAL AORTOGRAM W/LOWER EXTREMITY;  Surgeon: Sheree Penne Bruckner, MD;  Location: [facility] INVASIVE CV LAB;  Service: Cardiovascular;  Laterality: N/A;   AMPUTATION Right 07/24/2016   Procedure: TRANSMETATARSAL FOOT AMPUTATION;  Surgeon: Jerona LULLA Sage, MD;  Location: [facility] OR;  Service: Orthopedics;  Laterality: Right;   AMPUTATION Left 05/31/2024   Procedure: AMPUTATION BELOW KNEE;  Surgeon: [name], MD;  Location: [facility] OR;  Service: Vascular;  Laterality: Left;   APPLICATION OF WOUND VAC Left 11/30/2022   Procedure: APPLICATION OF WOUND VAC;  Surgeon: Silva Juliene SAUNDERS, DPM;  Location: [facility] OR;  Service: Podiatry;  Laterality: Left;   BALLOON DILATION N/A 03/21/2018   Procedure: BALLOON DILATION;  Surgeon: Donnald Charleston, MD;  Location: [facility] ENDOSCOPY;  Service: Endoscopy;  Laterality: N/A;   BIOPSY  03/16/2022   Procedure: BIOPSY;  Surgeon: Dianna Specking, MD;  Location: [facility] ENDOSCOPY;  Service: Endoscopy;;   BREAST LUMPECTOMY Right ~ 2000   benign   CARDIAC CATHETERIZATION N/A 07/30/2015   Procedure: Left Heart Cath and Coronary Angiography;  Surgeon: Bruckner JONETTA Cash, MD;  Location: [facility] INVASIVE CV LAB;  Service: Cardiovascular;  Laterality: N/A;   CARDIAC CATHETERIZATION N/A  07/19/2016   Procedure: Left Heart Cath and Coronary Angiography;  Surgeon: Victory LELON Sharps, MD;  Location: [facility] INVASIVE CV LAB;  Service: Cardiovascular;  Laterality: N/A;   CARDIAC CATHETERIZATION N/A 07/29/2016   Procedure: Coronary Stent Intervention;  Surgeon: Victory LELON Sharps, MD;  Location: [facility] INVASIVE CV LAB;  Service: Cardiovascular;  Laterality: N/A;   CARDIAC CATHETERIZATION N/A 07/29/2016   Procedure: Intravascular Ultrasound/IVUS;  Surgeon: Victory LELON Sharps, MD;  Location: [facility] INVASIVE CV LAB;  Service: Cardiovascular;  Laterality: N/A;   CATARACT EXTRACTION W/ INTRAOCULAR LENS  IMPLANT, BILATERAL Bilateral 2014-2015   COLONOSCOPY WITH PROPOFOL  N/A 08/22/2017   Procedure: COLONOSCOPY WITH PROPOFOL ;  Surgeon: Dianna Specking, MD;  Location: [facility] ENDOSCOPY;  Service: Endoscopy;  Laterality: N/A;   COLONOSCOPY WITH PROPOFOL  N/A 03/16/2022   Procedure: COLONOSCOPY WITH PROPOFOL ;  Surgeon: Dianna Specking, MD;  Location: [facility] ENDOSCOPY;  Service: Endoscopy;  Laterality: N/A;   CORONARY ANGIOPLASTY WITH STENT PLACEMENT  10/31/2011   30 % MID ATRIOVENTRICULAR GROOVE CX STENOSIS. 90 % MID RCA.PTA TO LAD/STENTING OF TANDEM  60-70%. LAD STENOSIS 99% REDUCED TO 0%.3.0 X PROMUS DES POSTDILATED TO 3.25MM.   CORONARY ANGIOPLASTY WITH STENT PLACEMENT  07/03/2014   2   CORONARY STENT INTERVENTION N/A 04/09/2019   Procedure: CORONARY STENT INTERVENTION;  Surgeon: Dann Candyce RAMAN, MD;  Location: [facility] INVASIVE CV LAB;  Service: Cardiovascular;  Laterality: N/A;   CORONARY STENT INTERVENTION N/A 08/22/2019   Procedure: CORONARY STENT INTERVENTION;  Surgeon: Swaziland, Peter M, MD;  Location: [facility] INVASIVE CV LAB;  Service: Cardiovascular;  Laterality: N/A;   ESOPHAGOGASTRODUODENOSCOPY (EGD) WITH PROPOFOL  N/A 03/21/2018   Procedure: ESOPHAGOGASTRODUODENOSCOPY (EGD) WITH PROPOFOL ;  Surgeon: Donnald Charleston, MD;  Location: [facility] ENDOSCOPY;  Service: Endoscopy;  Laterality: N/A;   ESOPHAGOGASTRODUODENOSCOPY (EGD) WITH PROPOFOL   N/A 03/16/2022   Procedure: ESOPHAGOGASTRODUODENOSCOPY (EGD) WITH PROPOFOL ;  Surgeon: Dianna Specking, MD;  Location: [facility] ENDOSCOPY;  Service: Endoscopy;  Laterality: N/A;   GRAFT APPLICATION Left 11/30/2022   Procedure: GRAFT APPLICATION;  Surgeon: Silva Juliene SAUNDERS, DPM;  Location: [facility] OR;  Service: Podiatry;  Laterality: Left;   I & D EXTREMITY Right 10/30/2013   Procedure: IRRIGATION AND DEBRIDEMENT RIGHT SMALL FINGER POSSIBLE SECONDARY WOUND CLOSURE;  Surgeon: Donnice DELENA Robinsons, MD;  Location: [facility] ORS;  Service: Orthopedics;  Laterality: Right;  Robinsons is available after 4pm   I & D EXTREMITY Right 11/01/2013   Procedure:  IRRIGATION AND DEBRIDEMENT RIGHT  PROXIMAL PHALANGEAL LEVEL AMPUTATION;  Surgeon: Donnice DELENA Robinsons, MD;  Location: [facility] ORS;  Service: Orthopedics;  Laterality: Right;   INCISION AND DRAINAGE Right 10/28/2013   Procedure: INCISION AND DRAINAGE Right small finger;  Surgeon: Donnice DELENA Robinsons, MD;  Location: [facility] ORS;  Service: Orthopedics;  Laterality: Right;   INCISION AND DRAINAGE OF WOUND Left 11/07/2022   Procedure: IRRIGATION AND DEBRIDEMENT WOUND;  Surgeon: Malvin Marsa FALCON, DPM;  Location: [facility] OR;  Service: Podiatry;  Laterality: Left;  Left foot ucler debridement and bone biopsy   LEFT HEART CATH Left 11/03/2011   Procedure: LEFT HEART CATH;  Surgeon: Debby DELENA Sor, MD;  Location: [facility] CATH LAB;  Service: Cardiovascular;  Laterality: Left;   LEFT HEART CATH AND CORONARY ANGIOGRAPHY N/A 04/09/2019   Procedure: LEFT HEART CATH AND CORONARY ANGIOGRAPHY;  Surgeon: Dann Candyce RAMAN, MD;  Location: [facility] INVASIVE CV LAB;  Service: Cardiovascular;  Laterality: N/A;   LEFT HEART CATHETERIZATION WITH CORONARY ANGIOGRAM N/A 07/03/2014   Procedure: LEFT HEART CATHETERIZATION WITH CORONARY ANGIOGRAM;  Surgeon: Victory LELON Claudene DOUGLAS, MD;  Location: [facility] CATH LAB;  Service: Cardiovascular;  Laterality: N/A;   PERCUTANEOUS CORONARY STENT INTERVENTION (PCI-S) Left 11/03/2011    Procedure: PERCUTANEOUS CORONARY STENT INTERVENTION (PCI-S);  Surgeon: Debby DELENA Sor, MD;  Location: [facility] CATH LAB;  Service: Cardiovascular;  Laterality: Left;   RIGHT/LEFT HEART CATH AND CORONARY ANGIOGRAPHY N/A 08/22/2019   Procedure: RIGHT/LEFT HEART CATH AND CORONARY ANGIOGRAPHY;  Surgeon: Swaziland, Peter M, MD;  Location: [facility] INVASIVE CV LAB;  Service: Cardiovascular;  Laterality: N/A;   TOE AMPUTATION Right ~ 2010   2nd toe   TOE AMPUTATION Right 2012   3rd toe   WOUND DEBRIDEMENT Left 11/30/2022   Procedure: DEBRIDEMENT WOUND;  Surgeon: Silva Juliene SAUNDERS, DPM;  Location: [facility] OR;  Service: Podiatry;  Laterality: Left;   Family History  Problem Relation Age of Onset   Sudden death Mother    Heart attack Maternal Aunt    Diabetes Maternal Grandmother    Hypertension Maternal Grandmother    Sudden death Other    Neuropathy Neg Hx  Social History:  reports that he has never smoked. He has never used smokeless tobacco. He reports current alcohol use. He reports that he does not use drugs. Allergies:  Allergies  Allergen Reactions   Amlodipine  Besylate     Other Reaction(s): leg cramps   Chlorthalidone Other (See Comments)    Causes dehydration, kidneys shut down  Other Reaction(s): Kidney Failure   Medications Prior to Admission  Medication Sig Dispense Refill   acetaminophen  (TYLENOL ) 325 MG tablet Take 2 tablets (650 mg total) by mouth every 6 (six) hours as needed for mild pain (or Fever >/= 101).     allopurinol  (ZYLOPRIM ) 100 MG tablet Take 1 tablet (100 mg total) by mouth daily. (Patient taking differently: Take 100 mg by mouth in the morning.) 100 tablet 1   aspirin  EC 81 MG tablet Take 1 tablet (81 mg total) by mouth daily. (Patient taking differently: Take 81 mg by mouth in the morning.) 30 tablet 0   cyclobenzaprine  (FLEXERIL ) 10 MG tablet Take 1 tablet (10 mg total) by mouth 3 (three) times daily as needed. 15 tablet 0   DULoxetine  (CYMBALTA ) 20 MG capsule Take 1  capsule (20 mg total) by mouth 2 (two) times daily. 200 capsule 1   famotidine  (PEPCID ) 20 MG tablet Take 1 tablet (20 mg total) by mouth at bedtime. 30 tablet 0   furosemide  (LASIX ) 40 MG tablet Take 1 tablet (40 mg total) by mouth 2 (two) times daily. 200 tablet 0   gabapentin  (NEURONTIN ) 100 MG capsule Take 1 capsule (100 mg total) by mouth at bedtime. 100 capsule 1   insulin  glargine (LANTUS  SOLOSTAR) 100 UNIT/ML Solostar Pen Inject 25 Units into the skin every morning. 15 mL 2   insulin  lispro (HUMALOG  KWIKPEN) 100 UNIT/ML KwikPen Inject 5-14 Units into the skin 3 (three) times daily with meals. Inject 5 units with meals, add the following per glucose level. CBG 70 - 120: 0 units CBG 121 - 150: 1 unit CBG 151 - 200: 2 units CBG 201 - 250: 3 units CBG 251 - 300: 5 units CBG 301 - 350: 7 units CBG 351 - 400: 9 units MDD: 50 units 30 mL 1   isosorbide  mononitrate (IMDUR ) 60 MG 24 hr tablet Take 2 tablets by mouth in the morning AND 1 tablet every evening. (Patient taking differently: Take 2 tablets by mouth in the morning AND then take 1 tablet by mouth every evening.) 270 tablet 3   latanoprost  (XALATAN ) 0.005 % ophthalmic solution Place 1 drop into both eyes at bedtime. 7.5 mL 1   losartan  (COZAAR ) 25 MG tablet Take 1 tablet (25 mg total) by mouth daily. (Patient taking differently: Take 25 mg by mouth in the morning.) 100 tablet 0   metoprolol  succinate (TOPROL -XL) 100 MG 24 hr tablet Take 1 tablet (100 mg total) by mouth daily. (Patient taking differently: Take 100 mg by mouth in the morning.) 90 tablet 1   nitroGLYCERIN  (NITROSTAT ) 0.4 MG SL tablet Place 1 tablet (0.4 mg total) under the tongue every 5 (five) minutes as needed for chest pain. 25 tablet 3   pantoprazole  (PROTONIX ) 40 MG tablet Take 1 tablet (40 mg total) by mouth 2 (two) times daily. 60 tablet 0   rosuvastatin  (CRESTOR ) 40 MG tablet Take 1 tablet (40 mg total) by mouth daily. (Patient taking differently: Take 40 mg by mouth in the  morning.) 90 tablet 3   Semaglutide , 1 MG/DOSE, (OZEMPIC , 1 MG/DOSE,) 4 MG/3ML SOPN Inject 1 mg  into the skin once a week. (Patient taking differently: Inject 1 mg into the skin once a week. On Thursday) 9 mL 3   SILVER  SULFADIAZINE  EX Apply 1 Application topically 3 (three) times a week. No set days for the patient.     ticagrelor  (BRILINTA ) 90 MG TABS tablet Take 1 tablet (90 mg total) by mouth 2 (two) times daily. 180 tablet 3   Blood Glucose Monitoring Suppl (BLOOD GLUCOSE MONITOR SYSTEM) w/Device KIT Use in the morning, at noon, and at bedtime. 1 kit 0   Continuous Glucose Receiver (FREESTYLE LIBRE 3 READER) DEVI Use as advised 1 each 0   Continuous Glucose Sensor (FREESTYLE LIBRE 3 PLUS SENSOR) MISC 1 each by Does not apply route every 14 (fourteen) days. 6 each 3   Insulin  Pen Needle 32G X 4 MM MISC Use with Lantus  100 each 0      Home: Home Living Family/patient expects to be discharged to:: Private residence Living Arrangements: Children, Other relatives (Son, son's fiance, 5 yo and infant grandchildren) Available Help at Discharge: Family, Available PRN/intermittently Type of Home: Mobile home Home Access: Stairs to enter Secretary/administrator of Steps: 4 Entrance Stairs-Rails: Right, Left, Can reach both Home Layout: One level Bathroom Shower/Tub: Engineer, manufacturing systems: Standard Bathroom Accessibility: Yes Home Equipment: Agricultural consultant (2 wheels), [employer] - single point Additional Comments: pt reports living with his son and family   Functional History: Prior Function Prior Level of Function : Independent/Modified Independent Mobility Comments: using SPC for past year due to balance deficits ADLs Comments: Ind with ADLs and IADLs. Pt works as an Radio producer.  Functional Status:  Mobility: Bed Mobility Overal bed mobility: Needs Assistance Bed Mobility: Supine to Sit Rolling: Supervision Supine to sit: Supervision, HOB elevated Sit to supine: Supervision General  bed mobility comments: pt in recliner upon OT arrival Transfers Overall transfer level: Needs assistance Equipment used: Rolling walker (2 wheels) Transfers: Sit to/from Stand, Bed to chair/wheelchair/BSC Sit to Stand: Contact guard assist Bed to/from chair/wheelchair/BSC transfer type:: Step pivot (hop-pivot) Stand pivot transfers: Min assist Step pivot transfers: Contact guard assist General transfer comment: Sit - stand from recliner with RW and hop-pivot transfer RW, min verbal cues for correct hand placemment and for sequencing; increased time to complete Ambulation/Gait Ambulation/Gait assistance: Contact guard assist Gait Distance (Feet): 32 Feet Assistive device: Rolling walker (2 wheels) Gait Pattern/deviations: Step-to pattern, Decreased stride length, Decreased step length - right General Gait Details: Pt utilizies hop-to gait pattern and is reliant on external device for maintaining upright. Pt had one instance of R knee buckling but did not require any assistance for correctin or maintaining upright. Gait velocity: reduced Gait velocity interpretation: <1.8 ft/sec, indicate of risk for recurrent falls    ADL: ADL Overall ADL's : Needs assistance/impaired Eating/Feeding: Set up, Sitting Grooming: Set up, Sitting Upper Body Bathing: Set up, Sitting, Supervision/ safety Lower Body Bathing: Minimal assistance, Sitting/lateral leans, Cueing for compensatory techniques Upper Body Dressing : Set up, Sitting Lower Body Dressing: Minimal assistance, Sitting/lateral leans, Cueing for compensatory techniques Lower Body Dressing Details (indicate cue type and reason): Pt able to donn/doff R sock/shoe and thread leg through clothing with Sueprvision sitting. Pt requires Max assist for bringing clothing up/down over hips in standing Toilet Transfer: Minimal assistance, Contact guard assist, Rolling walker (2 wheels), Cueing for safety, Cueing for sequencing Toileting- Clothing  Manipulation and Hygiene: Minimal assistance, Sitting/lateral lean Toileting - Clothing Manipulation Details (indicate cue type and reason): Set up for toileting hygiene  in sitting. Max assist for clothing management in sit/stand. Functional mobility during ADLs: Minimal assistance, Contact guard assist, Rolling walker (2 wheels), Cueing for safety, Cueing for sequencing General ADL Comments: pt educated on LB ADL compensatory techniques  Cognition: Cognition Orientation Level: Oriented X4 Cognition Arousal: Alert Behavior During Therapy: WFL for tasks assessed/performed  Physical Exam: Blood pressure 130/69, pulse 80, temperature 98.2 F (36.8 C), resp. rate 16, height 5' 9 (1.753 m), weight 110.2 kg, SpO2 99%. Physical Exam  Skin:    Comments: Right TMA is well-healed.  Left BKA dressed and appropriately tender   Neurological:     Comments: Patient is alert and oriented x 3 following commands.    Results for orders placed or performed during the hospital encounter of 05/25/24 (from the past 48 hours)  Glucose, capillary     Status: Abnormal   Collection Time: 06/03/24 11:26 AM  Result Value Ref Range   Glucose-Capillary 139 (H) 70 - 99 mg/dL    Comment: Glucose reference range applies only to samples taken after fasting for at least 8 hours.  Glucose, capillary     Status: Abnormal   Collection Time: 06/03/24  3:56 PM  Result Value Ref Range   Glucose-Capillary 146 (H) 70 - 99 mg/dL    Comment: Glucose reference range applies only to samples taken after fasting for at least 8 hours.  Glucose, capillary     Status: Abnormal   Collection Time: 06/03/24  9:05 PM  Result Value Ref Range   Glucose-Capillary 140 (H) 70 - 99 mg/dL    Comment: Glucose reference range applies only to samples taken after fasting for at least 8 hours.  Glucose, capillary     Status: None   Collection Time: 06/04/24  6:13 AM  Result Value Ref Range   Glucose-Capillary 94 70 - 99 mg/dL    Comment:  Glucose reference range applies only to samples taken after fasting for at least 8 hours.  Glucose, capillary     Status: Abnormal   Collection Time: 06/04/24 12:01 PM  Result Value Ref Range   Glucose-Capillary 134 (H) 70 - 99 mg/dL    Comment: Glucose reference range applies only to samples taken after fasting for at least 8 hours.  Glucose, capillary     Status: Abnormal   Collection Time: 06/04/24  5:20 PM  Result Value Ref Range   Glucose-Capillary 134 (H) 70 - 99 mg/dL    Comment: Glucose reference range applies only to samples taken after fasting for at least 8 hours.  Glucose, capillary     Status: Abnormal   Collection Time: 06/04/24  9:59 PM  Result Value Ref Range   Glucose-Capillary 116 (H) 70 - 99 mg/dL    Comment: Glucose reference range applies only to samples taken after fasting for at least 8 hours.  Glucose, capillary     Status: Abnormal   Collection Time: 06/05/24  6:52 AM  Result Value Ref Range   Glucose-Capillary 104 (H) 70 - 99 mg/dL    Comment: Glucose reference range applies only to samples taken after fasting for at least 8 hours.   *Note: Due to a large number of results and/or encounters for the requested time period, some results have not been displayed. A complete set of results can be found in Results Review.   No results found.    Blood pressure 130/69, pulse 80, temperature 98.2 F (36.8 C), resp. rate 16, height 5' 9 (1.753 m), weight 110.2 kg, SpO2 99%.  Medical Problem List and Plan: 1. Functional deficits secondary to left BKA 05/31/2024 as well as history of right TMA 2017  -patient may *** shower  -ELOS/Goals: *** 2.  Antithrombotics: -DVT/anticoagulation:  Pharmaceutical: Lovenox   -antiplatelet therapy: Aspirin  81 mg daily 3. Pain Management: Neurontin  100 mg nightly Flexeril  5 mg 3 times daily as needed, oxycodone  as needed 4. Mood/Behavior/Sleep: Cymbalta  20 mg twice daily  -antipsychotic agents: N/A 5. Neuropsych/cognition: This  patient is capable of making decisions on his own behalf. 6. Skin/Wound Care: Routine skin checks 7. Fluids/Electrolytes/Nutrition: Routine in and outs with follow-up chemistries 8.  CKD stage III/IV.  Follow nephrology services.  Lasix  has been resumed 40 mg twice daily 9.  CAD status post multiple PCI.  Continue aspirin , Imdur  20 mg daily, Toprol  100 mg daily.  Monitor with increased mobility 10.  Diabetes mellitus with peripheral neuropathy.  NovoLog  2 units 3 times daily, Semglee  2 units twice daily 11.  Hyperlipidemia.  Crestor  12.  BPH.  Flomax  0.4 mg daily.  Check PVR 13.  GERD.  Pepcid  14.  Class II obesity.  BMI 35.88.  Dietary follow-up 15.  Diastolic congestive heart failure.  Continue Lasix  therapy.  Monitor for any signs of fluid overload 16.OSA.CPAP  Toribio JINNY Pitch, PA-C 06/05/2024

## 2024-06-05 NOTE — [place] FL2 (Signed)
 [place]  MEDICAID FL2 LEVEL OF CARE FORM     IDENTIFICATION  Patient Name: Joshua [name] Birthdate: Oct 19, 1956 Sex: male Admission Date (Current Location): 05/25/2024  [facility] and [place] Number:  Producer, television/film/video and Address:  [facility] [place], [address] [place], [place] [address]      Provider Number:    Attending Physician Name and Address:  Odell Castor, Er[place], MD  Relative Name and Phone Number:       Current Level of Care: Hospital Recommended Level of Care: Skilled Nursing Facility Prior Approval Number:    Date Approved/Denied:   PASRR Number: pending  Discharge Plan: SNF    Current Diagnoses: Patient Active Problem List   Diagnosis Date Noted   Vitamin D  deficiency 07/28/2023   DKA (diabetic ketoacidosis) (HCC) 07/10/2023   Pharyngitis 07/10/2023   Lactic acidosis 07/10/2023   SIRS (systemic inflammatory response syndrome) (HCC) 07/10/2023   Elevated troponin 07/10/2023   Diabetic foot ulcer (HCC) 12/02/2022   Acute osteomyelitis of metatarsal bone of left foot (HCC) 11/30/2022   Left foot pain 11/26/2022   Protein-calorie malnutrition, severe 11/08/2022   Osteomyelitis (HCC) 11/06/2022   Diabetic foot infection (HCC) 11/06/2022   Osteomyelitis of fifth toe of left foot (HCC) 11/05/2022   (HFpEF) heart failure with preserved ejection fraction (HCC) 11/05/2022   CKD (chronic kidney disease), stage IV (HCC) 11/05/2022   Dysphagia 03/16/2022   History of colonic polyps 03/16/2022   Spinal stenosis of lumbar region 11/03/2021   Mixed diabetic hyperlipidemia associated with type 2 diabetes mellitus (HCC) 08/11/2021   Chronic low back pain    Hypokalemia 09/25/2020   Insulin  dependent type 2 diabetes mellitus (HCC) 09/24/2020   Unstable angina (HCC) 08/17/2019   Cardiac chest pain 08/16/2019   NSTEMI (non-ST elevated myocardial infarction) (HCC) 04/06/2019   ACS (acute coronary syndrome) (HCC) 04/06/2019   Sepsis (HCC)  03/15/2018   DKA (diabetic ketoacidosis) (HCC) 03/15/2018   Preop cardiovascular exam 08/22/2017   Acute renal failure superimposed on stage 3b chronic kidney disease (HCC) 05/19/2017   Essential hypertension    Status post transmetatarsal amputation of foot, right (HCC) 11/08/2016   Diabetic polyneuropathy associated with type 2 diabetes mellitus (HCC) 11/08/2016   Pure hypercholesterolemia    AKI (acute kidney injury) (HCC)    Acute blood loss anemia    Diabetes mellitus (HCC)    Physical debility 08/03/2016   Chronic osteomyelitis of right foot (HCC)    Cellulitis and abscess of leg, except foot    Non-healing wound of left lower extremity    Stage 3b chronic kidney disease (CKD) (HCC)    Unstable angina pectoris (HCC) 07/18/2016   Chronic diastolic CHF (congestive heart failure) (HCC)    Chest pain with moderate risk for cardiac etiology 06/24/2014   CAD S/P multiple PCIs 06/24/2014   Sleep apnea- on C-pap 06/24/2014   Class 3 severe obesity with serious comorbidity and body mass index (BMI) of 45.0 to 49.9 in adult 11/10/2013   Hyperglycemia 10/29/2013   Hyperlipidemia 01/12/2011   Benign essential HTN 01/12/2011   GERD (gastroesophageal reflux disease) 01/12/2011   Osteomyelitis of left foot (HCC) 01/12/2011    Orientation RESPIRATION BLADDER Height & Weight     Self, Time, Situation, Place  Normal Continent Weight: 110.2 kg Height:  5' 9 (175.3 cm)  BEHAVIORAL SYMPTOMS/MOOD NEUROLOGICAL BOWEL NUTRITION STATUS      Continent Diet (refer to d/c summary)  AMBULATORY STATUS COMMUNICATION OF NEEDS Skin  Extensive Assist Verbally Normal                       Personal Care Assistance Level of Assistance  Bathing, Feeding, Dressing Bathing Assistance: Maximum assistance Feeding assistance: Independent Dressing Assistance: Maximum assistance     Functional Limitations Info  Sight, Hearing, Speech Sight Info: Adequate Hearing Info: Adequate Speech Info: Adequate     SPECIAL CARE FACTORS FREQUENCY  PT (By licensed PT), OT (By licensed OT)     PT Frequency: 5x/ week, evaluate and treat OT Frequency: 5x/ week, evaluate and treat            Contractures Contractures Info: Not present    Additional Factors Info  Code Status, Allergies, Insulin  Sliding Scale Code Status Info: DNR Allergies Info: Amlodipine  Besylate, Chlorthalidone   Insulin  Sliding Scale Info: Novolog  SS, refer to d/c summary       Current Medications (06/05/2024):  This is the current hospital active medication list Current Facility-Administered Medications  Medication Dose Route Frequency Provider Last Rate Last Admin   acetaminophen  (TYLENOL ) tablet 1,000 mg  1,000 mg Oral Q6H PRN Bethanie Cough, PA-C   1,000 mg at 05/29/24 0845   aspirin  chewable tablet 81 mg  81 mg Oral Daily Bethanie Cough, PA-C   81 mg at 06/05/24 0845   cyclobenzaprine  (FLEXERIL ) tablet 5 mg  5 mg Oral TID PRN Bethanie Cough, PA-C   5 mg at 06/05/24 0319   DULoxetine  (CYMBALTA ) DR capsule 20 mg  20 mg Oral BID Eveland, Matthew, PA-C   20 mg at 06/05/24 9155   enoxaparin  (LOVENOX ) injection 30 mg  30 mg Subcutaneous Q24H Bethanie Cough, PA-C   30 mg at 06/05/24 0845   famotidine  (PEPCID ) tablet 10 mg  10 mg Oral QHS Bethanie Cough, PA-C   10 mg at 06/04/24 2207   furosemide  (LASIX ) tablet 40 mg  40 mg Oral BID Bethanie Cough, PA-C   40 mg at 06/05/24 9155   gabapentin  (NEURONTIN ) capsule 100 mg  100 mg Oral QHS Bethanie Cough, PA-C   100 mg at 06/04/24 2207   insulin  aspart (novoLOG ) injection 0-5 Units  0-5 Units Subcutaneous QHS Odell Celinda Balo, MD       insulin  aspart (novoLOG ) injection 0-6 Units  0-6 Units Subcutaneous TID WC Odell Celinda Balo, MD       insulin  aspart (novoLOG ) injection 2 Units  2 Units Subcutaneous TID WC Odell Celinda Balo, MD   2 Units at 06/05/24 1209   insulin  glargine-yfgn (SEMGLEE ) injection 2 Units  2 Units Subcutaneous BID Odell Celinda Balo, MD    2 Units at 06/05/24 9051   isosorbide  mononitrate (IMDUR ) 24 hr tablet 120 mg  120 mg Oral Daily Eveland, Matthew, PA-C   120 mg at 06/05/24 0845   latanoprost  (XALATAN ) 0.005 % ophthalmic solution 1 drop  1 drop Both Eyes QHS Eveland, Matthew, PA-C   1 drop at 06/04/24 2210   metoprolol  succinate (TOPROL -XL) 24 hr tablet 100 mg  100 mg Oral Daily Eveland, Matthew, PA-C   100 mg at 06/05/24 0845   morphine  (PF) 4 MG/ML injection 4 mg  4 mg Intravenous Q2H PRN Bethanie Cough, PA-C       Oral care mouth rinse  15 mL Mouth Rinse PRN Eveland, Matthew, PA-C       oxyCODONE  (Oxy IR/ROXICODONE ) immediate release tablet 5-10 mg  5-10 mg Oral Q4H PRN Bethanie Cough, PA-C   10 mg at 06/05/24 [phone]  pantoprazole  (PROTONIX ) EC tablet 40 mg  40 mg Oral BID Bethanie Cough, PA-C   40 mg at 06/05/24 0845   rosuvastatin  (CRESTOR ) tablet 10 mg  10 mg Oral Daily Eveland, Matthew, PA-C   10 mg at 06/05/24 9154   tamsulosin  (FLOMAX ) capsule 0.4 mg  0.4 mg Oral QPC supper Bethanie Cough, PA-C   0.4 mg at 06/04/24 8166     Discharge Medications: Please see discharge summary for a list of discharge medications.  Relevant Imaging Results:  Relevant Lab Results:   Additional Information SSN: [id number]  Rosalva Jon Bloch, RN

## 2024-06-05 NOTE — Progress Notes (Signed)
 Inpatient Rehab Admissions Coordinator:    Pt.'s case was denied by insurance. Pt. Notified and wants to pursue SNF.  Leita Kleine, MS, CCC-SLP Rehab Admissions Coordinator  [phone] (celll) [phone] (office)

## 2024-06-06 DIAGNOSIS — Z89431 Acquired absence of right foot: Secondary | ICD-10-CM | POA: Diagnosis not present

## 2024-06-06 DIAGNOSIS — G4730 Sleep apnea, unspecified: Secondary | ICD-10-CM | POA: Diagnosis not present

## 2024-06-06 DIAGNOSIS — L97424 Non-pressure chronic ulcer of left heel and midfoot with necrosis of bone: Secondary | ICD-10-CM | POA: Diagnosis not present

## 2024-06-06 DIAGNOSIS — S81802D Unspecified open wound, left lower leg, subsequent encounter: Secondary | ICD-10-CM | POA: Diagnosis not present

## 2024-06-06 DIAGNOSIS — M6259 Muscle wasting and atrophy, not elsewhere classified, multiple sites: Secondary | ICD-10-CM | POA: Diagnosis not present

## 2024-06-06 DIAGNOSIS — E559 Vitamin D deficiency, unspecified: Secondary | ICD-10-CM | POA: Diagnosis not present

## 2024-06-06 DIAGNOSIS — I2510 Atherosclerotic heart disease of native coronary artery without angina pectoris: Secondary | ICD-10-CM | POA: Diagnosis not present

## 2024-06-06 DIAGNOSIS — R2681 Unsteadiness on feet: Secondary | ICD-10-CM | POA: Diagnosis not present

## 2024-06-06 DIAGNOSIS — Z9861 Coronary angioplasty status: Secondary | ICD-10-CM | POA: Diagnosis not present

## 2024-06-06 DIAGNOSIS — Z741 Need for assistance with personal care: Secondary | ICD-10-CM | POA: Diagnosis not present

## 2024-06-06 DIAGNOSIS — E11628 Type 2 diabetes mellitus with other skin complications: Secondary | ICD-10-CM | POA: Diagnosis not present

## 2024-06-06 DIAGNOSIS — E11621 Type 2 diabetes mellitus with foot ulcer: Secondary | ICD-10-CM | POA: Diagnosis not present

## 2024-06-06 DIAGNOSIS — N1832 Chronic kidney disease, stage 3b: Secondary | ICD-10-CM | POA: Diagnosis not present

## 2024-06-06 DIAGNOSIS — Z89432 Acquired absence of left foot: Secondary | ICD-10-CM | POA: Diagnosis not present

## 2024-06-06 DIAGNOSIS — E119 Type 2 diabetes mellitus without complications: Secondary | ICD-10-CM | POA: Diagnosis not present

## 2024-06-06 DIAGNOSIS — E785 Hyperlipidemia, unspecified: Secondary | ICD-10-CM | POA: Diagnosis not present

## 2024-06-06 DIAGNOSIS — Z4781 Encounter for orthopedic aftercare following surgical amputation: Secondary | ICD-10-CM | POA: Diagnosis not present

## 2024-06-06 DIAGNOSIS — M869 Osteomyelitis, unspecified: Secondary | ICD-10-CM | POA: Diagnosis not present

## 2024-06-06 DIAGNOSIS — L089 Local infection of the skin and subcutaneous tissue, unspecified: Secondary | ICD-10-CM | POA: Diagnosis not present

## 2024-06-06 DIAGNOSIS — R2689 Other abnormalities of gait and mobility: Secondary | ICD-10-CM | POA: Diagnosis not present

## 2024-06-06 DIAGNOSIS — I5042 Chronic combined systolic (congestive) and diastolic (congestive) heart failure: Secondary | ICD-10-CM | POA: Diagnosis not present

## 2024-06-06 DIAGNOSIS — G8929 Other chronic pain: Secondary | ICD-10-CM | POA: Diagnosis not present

## 2024-06-06 DIAGNOSIS — Z794 Long term (current) use of insulin: Secondary | ICD-10-CM | POA: Diagnosis not present

## 2024-06-06 DIAGNOSIS — E43 Unspecified severe protein-calorie malnutrition: Secondary | ICD-10-CM | POA: Diagnosis not present

## 2024-06-06 DIAGNOSIS — M6281 Muscle weakness (generalized): Secondary | ICD-10-CM | POA: Diagnosis not present

## 2024-06-06 DIAGNOSIS — E1142 Type 2 diabetes mellitus with diabetic polyneuropathy: Secondary | ICD-10-CM | POA: Diagnosis not present

## 2024-06-06 DIAGNOSIS — I10 Essential (primary) hypertension: Secondary | ICD-10-CM | POA: Diagnosis not present

## 2024-06-06 DIAGNOSIS — I5032 Chronic diastolic (congestive) heart failure: Secondary | ICD-10-CM | POA: Diagnosis not present

## 2024-06-06 DIAGNOSIS — D631 Anemia in chronic kidney disease: Secondary | ICD-10-CM | POA: Diagnosis not present

## 2024-06-06 DIAGNOSIS — K219 Gastro-esophageal reflux disease without esophagitis: Secondary | ICD-10-CM | POA: Diagnosis not present

## 2024-06-06 DIAGNOSIS — I200 Unstable angina: Secondary | ICD-10-CM | POA: Diagnosis not present

## 2024-06-06 DIAGNOSIS — M86672 Other chronic osteomyelitis, left ankle and foot: Secondary | ICD-10-CM | POA: Diagnosis not present

## 2024-06-06 LAB — GLUCOSE, CAPILLARY
Glucose-Capillary: 127 mg/dL — ABNORMAL HIGH (ref 70–99)
Glucose-Capillary: 146 mg/dL — ABNORMAL HIGH (ref 70–99)
Glucose-Capillary: 126 mg/dL — ABNORMAL HIGH (ref 70–99)

## 2024-06-06 MED ORDER — SENNOSIDES-DOCUSATE SODIUM 8.6-50 MG PO TABS: 1.0000 | ORAL_TABLET | Freq: Two times a day (BID) | ORAL | Status: AC | PRN

## 2024-06-06 MED ORDER — TAMSULOSIN HCL 0.4 MG PO CAPS: 0.4000 mg | ORAL_CAPSULE | Freq: Every day | ORAL | Status: DC

## 2024-06-06 MED ORDER — ROSUVASTATIN CALCIUM 10 MG PO TABS: 10.0000 mg | ORAL_TABLET | Freq: Every day | ORAL | Status: DC

## 2024-06-06 MED ORDER — OXYCODONE HCL 5 MG PO TABS: 5.0000 mg | ORAL_TABLET | Freq: Three times a day (TID) | ORAL | 0 refills | Status: AC | PRN

## 2024-06-06 MED ORDER — TICAGRELOR 90 MG PO TABS
90.0000 mg | ORAL_TABLET | Freq: Two times a day (BID) | ORAL | Status: DC
  Administered 2024-06-06: 90 mg via ORAL
  Filled 2024-06-06 (×2): qty 1

## 2024-06-06 MED ORDER — INSULIN GLARGINE-YFGN 100 UNIT/ML ~~LOC~~ SOLN: 8.0000 [IU] | Freq: Every day | SUBCUTANEOUS | Status: DC

## 2024-06-06 MED ORDER — ALLOPURINOL 100 MG PO TABS: 50.0000 mg | ORAL_TABLET | Freq: Every day | ORAL | Status: AC

## 2024-06-06 NOTE — Progress Notes (Signed)
 Provider came to bedside and removed wound vac. Gauze and stump shrinker placed.

## 2024-06-06 NOTE — Progress Notes (Signed)
 Removed IV-CDI. Wheeled stable patient and belongings to main entrance where he was picked up by a Taxi to d/c to [facility]. I attempted to call report and the secretary took my phone number.

## 2024-06-06 NOTE — Plan of Care (Signed)
  Problem: Education: Goal: Ability to describe self-care measures that may prevent or decrease complications (Diabetes Survival Skills Education) will improve Outcome: Adequate for Discharge Goal: Individualized Educational Video(s) Outcome: Adequate for Discharge   Problem: Coping: Goal: Ability to adjust to condition or change in health will improve 06/06/2024 1748 by Delores Kirsch, RN Outcome: Adequate for Discharge 06/06/2024 1455 by Delores Kirsch, RN Outcome: Progressing   Problem: Fluid Volume: Goal: Ability to maintain a balanced intake and output will improve 06/06/2024 1748 by Delores Kirsch, RN Outcome: Adequate for Discharge 06/06/2024 1455 by Delores Kirsch, RN Outcome: Progressing   Problem: Health Behavior/Discharge Planning: Goal: Ability to identify and utilize available resources and services will improve Outcome: Adequate for Discharge Goal: Ability to manage health-related needs will improve Outcome: Adequate for Discharge   Problem: Metabolic: Goal: Ability to maintain appropriate glucose levels will improve Outcome: Adequate for Discharge   Problem: Nutritional: Goal: Maintenance of adequate nutrition will improve Outcome: Adequate for Discharge Goal: Progress toward achieving an optimal weight will improve Outcome: Adequate for Discharge   Problem: Skin Integrity: Goal: Risk for impaired skin integrity will decrease Outcome: Adequate for Discharge   Problem: Tissue Perfusion: Goal: Adequacy of tissue perfusion will improve Outcome: Adequate for Discharge   Problem: Education: Goal: Knowledge of General Education information will improve Description: Including pain rating scale, medication(s)/side effects and non-pharmacologic comfort measures 06/06/2024 1748 by Delores Kirsch, RN Outcome: Adequate for Discharge 06/06/2024 1455 by Delores Kirsch, RN Outcome: Progressing   Problem: Health Behavior/Discharge Planning: Goal: Ability to manage  health-related needs will improve 06/06/2024 1748 by Delores Kirsch, RN Outcome: Adequate for Discharge 06/06/2024 1455 by Delores Kirsch, RN Outcome: Progressing   Problem: Clinical Measurements: Goal: Ability to maintain clinical measurements within normal limits will improve Outcome: Adequate for Discharge Goal: Will remain free from infection Outcome: Adequate for Discharge Goal: Diagnostic test results will improve Outcome: Adequate for Discharge Goal: Respiratory complications will improve Outcome: Adequate for Discharge Goal: Cardiovascular complication will be avoided Outcome: Adequate for Discharge   Problem: Activity: Goal: Risk for activity intolerance will decrease Outcome: Adequate for Discharge   Problem: Nutrition: Goal: Adequate nutrition will be maintained Outcome: Adequate for Discharge   Problem: Coping: Goal: Level of anxiety will decrease Outcome: Adequate for Discharge   Problem: Elimination: Goal: Will not experience complications related to bowel motility Outcome: Adequate for Discharge Goal: Will not experience complications related to urinary retention Outcome: Adequate for Discharge   Problem: Pain Managment: Goal: General experience of comfort will improve and/or be controlled Outcome: Adequate for Discharge   Problem: Safety: Goal: Ability to remain free from injury will improve Outcome: Adequate for Discharge   Problem: Skin Integrity: Goal: Risk for impaired skin integrity will decrease Outcome: Adequate for Discharge

## 2024-06-06 NOTE — TOC Progression Note (Addendum)
 Transition of Care [facility]) - Progression Note    Patient Details  Name: Joshua [name] MRN: [id number] Date of Birth: Nov 09, 1956  [facility] CM/SW Contact  Bridget Cordella Simmonds, LCSW Phone Number: 06/06/2024, 9:52 AM  Clinical Narrative:   PASSR: [id number]  Bed offers provided to pt on medicare choice document, he will reivew.  1030: CSW spoke with pt, he would like to accept offer at [facility].  CSW confirmed with [facility]: they can receive pt today with auth approved.  Auth request submitted in [place].    1430: Auth approved: [id number], 3 days: 6/25-6/27. 1440: CSW confirmed with [facility] that they can receive pt today.  MD informed.   Per PT, pt does not require ambulance transport.  CSW spoke with pt about options.  His son is at work and will not be off until late, he does not have other transport options.  Cab voucher provided, transportation waiver signed and placed on chart.     Barriers to Discharge: Continued Medical Work up  Expected Discharge Plan and Services   Discharge Planning Services: CM Consult   Living arrangements for the past 2 months: Single Family Home                                       Social Determinants of Health (SDOH) Interventions SDOH Screenings   Food Insecurity: Food Insecurity Present (05/25/2024)  Housing: Low Risk  (05/25/2024)  Transportation Needs: Unmet Transportation Needs (05/25/2024)  Utilities: At Risk (05/25/2024)  Alcohol Screen: Medium Risk (07/27/2023)  Depression (PHQ2-9): Low Risk  (07/27/2023)  Financial Resource Strain: Medium Risk (07/27/2023)  Physical Activity: Inactive (07/27/2023)  Social Connections: Unknown (05/25/2024)  Stress: Stress Concern Present (07/27/2023)  Tobacco Use: Low Risk  (05/31/2024)    Readmission Risk Interventions    11/08/2022    2:21 PM  Readmission Risk Prevention Plan  Transportation Screening Complete  PCP or Specialist Appt within 5-7 Days Complete   Home Care Screening Complete  Medication Review (RN CM) Complete

## 2024-06-06 NOTE — TOC Transition Note (Signed)
 Transition of Care [facility]) - Discharge Note   Patient Details  Name: Joshua [name] MRN: [id number] Date of Birth: 05-03-1956  [facility] CM/SW Contact:  Bridget Cordella Simmonds, LCSW Phone Number: 06/06/2024, 3:26 PM   Clinical Narrative:  Pt discharging to [facility].  RN call report to [phone].    Pt will go by taxi, voucher on pt chart, call [employer] taxi [phone] when ready.      Final next level of care: Skilled Nursing Facility Barriers to Discharge: Barriers Resolved   Patient Goals and CMS Choice            Discharge Placement              Patient [facility] bed at: [facility] Patient to be transferred to facility by: taxi Name of family member notified: son [place] Patient and family notified of of transfer: 06/06/24  Discharge Plan and Services Additional resources added to the After Visit Summary for     Discharge Planning Services: CM Consult                                 Social Drivers of Health (SDOH) Interventions SDOH Screenings   Food Insecurity: Food Insecurity Present (05/25/2024)  Housing: Low Risk  (05/25/2024)  Transportation Needs: Unmet Transportation Needs (05/25/2024)  Utilities: At Risk (05/25/2024)  Alcohol Screen: Medium Risk (07/27/2023)  Depression (PHQ2-9): Low Risk  (07/27/2023)  Financial Resource Strain: Medium Risk (07/27/2023)  Physical Activity: Inactive (07/27/2023)  Social Connections: Unknown (05/25/2024)  Stress: Stress Concern Present (07/27/2023)  Tobacco Use: Low Risk  (05/31/2024)     Readmission Risk Interventions    11/08/2022    2:21 PM  Readmission Risk Prevention Plan  Transportation Screening Complete  PCP or Specialist Appt within 5-7 Days Complete  Home Care Screening Complete  Medication Review (RN CM) Complete

## 2024-06-06 NOTE — Plan of Care (Signed)
  Problem: Coping: Goal: Ability to adjust to condition or change in health will improve Outcome: Progressing   Problem: Fluid Volume: Goal: Ability to maintain a balanced intake and output will improve Outcome: Progressing   Problem: Education: Goal: Knowledge of General Education information will improve Description: Including pain rating scale, medication(s)/side effects and non-pharmacologic comfort measures Outcome: Progressing   Problem: Health Behavior/Discharge Planning: Goal: Ability to manage health-related needs will improve Outcome: Progressing

## 2024-06-06 NOTE — Progress Notes (Signed)
 Physical Therapy Treatment Patient Details Name: Joshua [name] MRN: [id number] DOB: [birth year] Today's Date: 06/06/2024   History of Present Illness Pt is a 67 M s/p L BKA on 05/31/24 resulting from diabetic foot infection. PMH includes CAD with multiple PCI's, HFpEF, essential hypertension diabetes mellitus type 2 with peripheral neuropathy, status post R metatarsal amputation 2017, with 40 left lower extremity healing wound, chronic kidney see stage IV, obesity    PT Comments  Patient resting in recliner and agreeable and eager to mobilize with therapist this date. Pt limb protector out of place and educated on proper donning/position. Pt demonstrated good recall and technique for transfers with RW. CGA for safety with pt stable in standing. Pt amb 2x 40' with RW and Rt knee noted to flex slightly as pt fatigued but no overt LOB or buckling noted and pt denied Rt knee pain. EOS pt agreeable to remain OOB in recliner and breakfast tray and all needs/call bell within reach. Will continue to progress pt as able during acute stay.    If plan is discharge home, recommend the following: A little help with walking and/or transfers;A little help with bathing/dressing/bathroom;Assistance with cooking/housework;Assist for transportation;Help with stairs or ramp for entrance   Can travel by private vehicle        Equipment Recommendations  Wheelchair (measurements PT);Wheelchair cushion (measurements PT)    Recommendations for Other Services       Precautions / Restrictions Precautions Precautions: Fall Recall of Precautions/Restrictions: Intact Restrictions Weight Bearing Restrictions Per Provider Order: Yes LLE Weight Bearing Per Provider Order: Non weight bearing     Mobility  Bed Mobility Overal bed mobility: Needs Assistance Bed Mobility: Supine to Sit     Supine to sit: Supervision, HOB elevated     General bed mobility comments: Increased time to complete. Pt left in recliner  at end of session    Transfers Overall transfer level: Needs assistance Equipment used: Rolling walker (2 wheels) Transfers: Sit to/from Stand, Bed to chair/wheelchair/BSC Sit to Stand: Contact guard assist   Step pivot transfers: Contact guard assist       General transfer comment: Pt completed STS w/ RW and no physical assistance and stable once upright.    Ambulation/Gait Ambulation/Gait assistance: Contact guard assist   Assistive device: Rolling walker (2 wheels) Gait Pattern/deviations: Step-to pattern, Decreased stride length, Decreased step length - right Gait velocity: reduced     General Gait Details: pt maintained safe position to RW, hop to pattern and pt wearing shoe on Rt LE. due to Rt transmet amp pt havignsome difficulty clearing toes of shoe as fatigued.   Stairs             Wheelchair Mobility     Tilt Bed    Modified Rankin (Stroke Patients Only)       Balance Overall balance assessment: Needs assistance Sitting-balance support: No upper extremity supported, Feet supported Sitting balance-Leahy Scale: Good Sitting balance - Comments: seated EOB w/out BUE support   Standing balance support: Bilateral upper extremity supported, Reliant on assistive device for balance, During functional activity Standing balance-Leahy Scale: Poor Standing balance comment: reliant on external support                            Communication Communication Communication: No apparent difficulties  Cognition Arousal: Alert Behavior During Therapy: WFL for tasks assessed/performed   PT - Cognitive impairments: No apparent impairments  Following commands: Intact      Cueing Cueing Techniques: Verbal cues, Visual cues  Exercises      General Comments        Pertinent Vitals/Pain Pain Assessment Faces Pain Scale: Hurts a little bit Pain Location: L LE Pain Descriptors / Indicators: Discomfort, Grimacing,  Guarding    Home Living                          Prior Function            PT Goals (current goals can now be found in the care plan section) Acute Rehab PT Goals Patient Stated Goal: to be as independent as possible PT Goal Formulation: With patient Time For Goal Achievement: 06/15/24 Potential to Achieve Goals: Good Progress towards PT goals: Progressing toward goals    Frequency    Min 2X/week      PT Plan      Co-evaluation              AM-PAC PT 6 Clicks Mobility   Outcome Measure  Help needed turning from your back to your side while in a flat bed without using bedrails?: A Little Help needed moving from lying on your back to sitting on the side of a flat bed without using bedrails?: A Little Help needed moving to and from a bed to a chair (including a wheelchair)?: A Little Help needed standing up from a chair using your arms (e.g., wheelchair or bedside chair)?: A Little Help needed to walk in hospital room?: A Little Help needed climbing 3-5 steps with a railing? : Total 6 Click Score: 16    End of Session Equipment Utilized During Treatment: Gait belt;Other (comment) (L limb guard) Activity Tolerance: Patient tolerated treatment well Patient left: in chair;with call bell/phone within reach;with chair alarm set Nurse Communication: Mobility status PT Visit Diagnosis: Unsteadiness on feet (R26.81);Other abnormalities of gait and mobility (R26.89);Muscle weakness (generalized) (M62.81)     Time: 8891-8869 PT Time Calculation (min) (ACUTE ONLY): 22 min  Charges:    $Gait Training: 8-22 mins PT General Charges $$ ACUTE PT VISIT: 1 Visit                     Vernell DONEEN KLEIN, DPT Acute Rehabilitation Services Office [phone]  06/06/24 4:11 PM

## 2024-06-06 NOTE — Progress Notes (Signed)
 Occupational Therapy Treatment Patient Details Name: Joshua [name] MRN: [id number] DOB: June 01, 1956 Today's Date: 06/06/2024   History of present illness Pt is a 70 M s/p L BKA on 05/31/24 resulting from diabetic foot infection. PMH includes CAD with multiple PCI's, HFpEF, essential hypertension diabetes mellitus type 2 with peripheral neuropathy, status post R metatarsal amputation 2017, with 40 left lower extremity healing wound, chronic kidney see stage IV, obesity   OT comments  OT session focused on training in techniques for increased safety and independence with all steps of toileting task, grooming tasks in standing with RW, and functional transfers. Pt currently demonstrates ability to complete toileting task with Contact guard assist, grooming tasks in standing with RW with Contact guard to Min assist, and functional transfers with a RW with Contact guard assist. Pt participated well in session, is motivated to return to PLOF, and is making progress toward OT goals. Plan for pt to discharge later this day. Pt will benefit from continued acute skilled OT services if not discharged as planned. OT continues to recommend post acute intensive inpatient skilled rehab services > 3 hours per day; however, this was denied by pt's insurance. Due to this, pt will benefit from post acute intensive inpatient skilled rehab services < 3 hours per day.       If plan is discharge home, recommend the following:  A little help with walking and/or transfers;Assistance with cooking/housework;Assist for transportation;Help with stairs or ramp for entrance;A little help with bathing/dressing/bathroom   Equipment Recommendations  Tub/shower bench;BSC/3in1    Recommendations for Other Services      Precautions / Restrictions Precautions Precautions: Fall Recall of Precautions/Restrictions: Intact Precaution/Restrictions Comments: R transmet amputation; limb guard on when OOB Restrictions Weight Bearing  Restrictions Per Provider Order: Yes LLE Weight Bearing Per Provider Order: Non weight bearing       Mobility Bed Mobility               General bed mobility comments: Pt sitting in recliner at beginning and end of session    Transfers Overall transfer level: Needs assistance Equipment used: Rolling walker (2 wheels) Transfers: Sit to/from Stand, Bed to chair/wheelchair/BSC Sit to Stand: Contact guard assist     Step pivot transfers: Contact guard assist           Balance Overall balance assessment: Needs assistance Sitting-balance support: No upper extremity supported, Feet supported Sitting balance-Leahy Scale: Good     Standing balance support: Bilateral upper extremity supported, Single extremity supported, During functional activity, Reliant on assistive device for balance Standing balance-Leahy Scale: Poor Standing balance comment: reliant on external support                           ADL either performed or assessed with clinical judgement   ADL Overall ADL's : Needs assistance/impaired     Grooming: Wash/dry hands;Oral care;Contact guard assist;Minimal assistance;Standing;Cueing for compensatory techniques                   Toilet Transfer: Contact guard assist;Ambulation;Rolling walker (2 wheels);Regular Toilet;Grab bars;Cueing for safety (Cues for compensatory techniques)   Toileting- Clothing Manipulation and Hygiene: Contact guard assist;Sitting/lateral lean;Cueing for compensatory techniques       Functional mobility during ADLs: Contact guard assist;Rolling walker (2 wheels)      Extremity/Trunk Assessment Upper Extremity Assessment Upper Extremity Assessment: Right hand dominant;RUE deficits/detail;LUE deficits/detail RUE Deficits / Details: decreased fine motor coordination; gross hand strength 3/5;  hx of partial 5th digit amputation; pt reports occasional burning sensation in 4th digit not present this session; strength,  ROM, sensation, and gross motor coordinaiton otherwise WFL RUE Coordination: decreased fine motor LUE Deficits / Details: decreased fine motor coordination; gross hand strength 3/5; pt reports occasional numbness or buring in ulnar side of hand not present this session; strength, ROM, sensation, and gross motor coordinaiton otherwise WFL LUE Coordination: decreased fine motor   Lower Extremity Assessment Lower Extremity Assessment: Defer to PT evaluation        Vision       Perception     Praxis     Communication Communication Communication: No apparent difficulties   Cognition Arousal: Alert Behavior During Therapy: WFL for tasks assessed/performed Cognition: No apparent impairments             OT - Cognition Comments: Pt is AAOx4 and pleasant throughout session                          Cueing   Cueing Techniques: Verbal cues, Gestural cues, Visual cues  Exercises      Shoulder Instructions       General Comments NT entering room at end of session    Pertinent Vitals/ Pain       Pain Assessment Pain Assessment: Faces Faces Pain Scale: Hurts a little bit Pain Location: L LE Pain Descriptors / Indicators: Discomfort, Grimacing, Guarding Pain Intervention(s): Limited activity within patient's tolerance, Monitored during session, Repositioned  Home Living                                          Prior Functioning/Environment              Frequency  Min 2X/week        Progress Toward Goals  OT Goals(current goals can now be found in the care plan section)  Progress towards OT goals: Progressing toward goals  Acute Rehab OT Goals Patient Stated Goal: to continue to be more independent and confident using the RW and with self-care tasks in standing  Plan      Co-evaluation                 AM-PAC OT 6 Clicks Daily Activity     Outcome Measure   Help from another person eating meals?: None Help from  another person taking care of personal grooming?: A Little Help from another person toileting, which includes using toliet, bedpan, or urinal?: A Little Help from another person bathing (including washing, rinsing, drying)?: A Little Help from another person to put on and taking off regular upper body clothing?: A Little Help from another person to put on and taking off regular lower body clothing?: A Little 6 Click Score: 19    End of Session Equipment Utilized During Treatment: Gait belt;Rolling walker (2 wheels);Other (comment) (wound vac)  OT Visit Diagnosis: Unsteadiness on feet (R26.81);Other abnormalities of gait and mobility (R26.89);Muscle weakness (generalized) (M62.81)   Activity Tolerance Patient tolerated treatment well   Patient Left in chair;with call bell/phone within reach;with nursing/sitter in room   Nurse Communication Mobility status;Other (comment) (Plan for pt to DC later this afternoon)        Time: 8463-8395 OT Time Calculation (min): 28 min  Charges: OT General Charges $OT Visit: 1 Visit OT Treatments $Self Care/Home Management : 23-37  mins  Margarie Rockey HERO., OTR/L, MA Acute Rehab [phone]   Margarie FORBES Horns 06/06/2024, 4:50 PM

## 2024-06-06 NOTE — Progress Notes (Signed)
  Progress Note    06/06/2024 6:31 AM 6 Days Post-Op  Subjective:  sleeping and I did not wake him.  Afebrile  Vitals:   06/05/24 1956 06/06/24 0603  BP: 132/79 113/71  Pulse: 77 75  Resp: 18 15  Temp: 98.4 F (36.9 C) 98.2 F (36.8 C)  SpO2: 100% 100%    Physical Exam: Incisions:  limb guard in place   CBC    Component Value Date/Time   WBC 6.1 05/31/2024 0527   RBC 4.08 (L) 05/31/2024 0527   HGB 11.8 (L) 05/31/2024 0527   HGB 12.3 (L) 08/11/2021 0929   HCT 37.4 (L) 05/31/2024 0527   HCT 39.1 08/11/2021 0929   PLT 151 05/31/2024 0527   PLT 254 08/11/2021 0929   MCV 91.7 05/31/2024 0527   MCV 84 08/11/2021 0929   MCH 28.9 05/31/2024 0527   MCHC 31.6 05/31/2024 0527   RDW 15.1 05/31/2024 0527   RDW 15.9 (H) 08/11/2021 0929   LYMPHSABS 1.2 05/25/2024 1354   LYMPHSABS 2.0 08/11/2021 0929   MONOABS 1.3 (H) 05/25/2024 1354   EOSABS 0.0 05/25/2024 1354   EOSABS 0.2 08/11/2021 0929   BASOSABS 0.0 05/25/2024 1354   BASOSABS 0.0 08/11/2021 0929    BMET    Component Value Date/Time   NA 137 05/31/2024 0527   NA 141 10/26/2021 1044   K 3.9 05/31/2024 0527   CL 104 05/31/2024 0527   CO2 21 (L) 05/31/2024 0527   GLUCOSE 110 (H) 05/31/2024 0527   BUN 42 (H) 05/31/2024 0527   BUN 33 (H) 10/26/2021 1044   CREATININE 3.03 (H) 06/01/2024 0554   CREATININE 2.11 (H) 11/18/2022 1605   CALCIUM  8.5 (L) 05/31/2024 0527   GFRNONAA 22 (L) 06/01/2024 0554   GFRNONAA 51 (L) 11/01/2016 1012   GFRAA 27 (L) 08/23/2019 0340   GFRAA 58 (L) 11/01/2016 1012    INR    Component Value Date/Time   INR 1.1 07/10/2023 2018     Intake/Output Summary (Last 24 hours) at 06/06/2024 0631 Last data filed at 06/06/2024 0400 Gross per 24 hour  Intake 360 ml  Output 1100 ml  Net -740 ml     Assessment/Plan:  68 y.o. male is s/p left below knee amputation   6 Days Post-Op   -plan to remove dressing prior to discharge to next facility.  -CIR insurance denied.  Plan for  SNF   Lucie Apt, PA-C [facility] [phone] 06/06/2024 6:31 AM

## 2024-06-06 NOTE — Discharge Summary (Signed)
 Physician Discharge Summary  Joshua [name] FMW:[id number] DOB: [birth year] DOA: 05/25/2024  PCP: Joshua [name] SQUIBB, PA  Admit date: 05/25/2024 Discharge date: 06/06/24  Admitted From: Home Disposition: SNF Recommendations for Outpatient Follow-up:  Outpatient follow-up with vascular surgery Check CMP and CBC at follow-up Optimize glycemic control Please follow up on the following pending results: None   Discharge Condition: Stable CODE STATUS: Full code  Follow-up Information     [name], Joshua P, PA Follow up.   Specialty: Physician Assistant Contact information: [phone] [address] [place] [address] [phone]         Joshua [name] SAILOR, MD. Schedule an appointment as soon as possible for a visit in 2 week(s).   Specialties: Vascular Surgery, Interventional Cardiology Contact information: [address] [place] [address] [phone]                 Hospital course 68 year old M with PMH of CAD s/[name] multiple PCI's, HFpEF, HTN, DM-2 with neuropathy, right MCA in 2017, CKD-4, obesity and left lower extremity wound that did not improve despite multiple rounds of antibiotics and management by ID and podiatry presented from podiatry office for left foot diabetic ulcer with osteomyelitis.  Outpatient MRI on 05/16/2024 concerning for osteomyelitis involving the lateral aspect of the left cuboid, diffuse cellulitis without discrete focal fluid collection, mild fasciitis and significant degenerative changes in his left foot  Patient was started on broad-spectrum antibiotics.  Podiatry consulted and recommended vascular surgery consult.  Patient underwent left BKA with wound VAC placement by Dr. Magda on 05/31/2024.  Therapy recommended inpatient rehab but insurance declined.  Patient is discharged to SNF.  Hospital course complicated by AKI on CKD-4 that has resolved, mild metabolic acidosis and anemia are stable.  See individual problem list below  for more.   Problems addressed during this hospitalization Left foot ulcer with osteomyelitis/ Diabetic foot infection [facility]): -Podiatry was consulted recommended vascular surgery consult and orthopedic. -S/[name] left BKA on 05/31/2024.  No further antibiotics needed. -Brilinta  and aspirin  would serve DVT prophylaxis. -Insurance declined inpatient rehab and patient is discharged to SNF. -Vascular surgery recommended removing dressing prior to discharge -Outpatient follow-up with vascular surgery -Optimize glycemic control  History of CAD s/[name] multiple PCI's: Stable.  Followed by cardiology outpatient. -Continue home Imdur , Toprol -XL, Brilinta  and aspirin . -Crestor  decreased to 10 mg daily given his poor renal function -Outpatient follow-up with cardiology   AKI on CKD-follow: b/l Cr ~unknown.  AKI seems to have resolved. Recent Labs    07/11/23 1448 07/12/23 0326 07/13/23 0010 05/25/24 1354 05/25/24 2026 05/26/24 0647 05/27/24 0858 05/28/24 0621 05/29/24 0604 05/30/24 0548 05/31/24 0527 06/01/24 0554  BUN 41* 37* 30* 73*  --  71* 70* 61* 53* 44* 42*  --   CREATININE 2.43* 2.45* 1.91* 5.71* 5.57* 5.55* 5.26* 4.82* 4.11* 3.56* 3.19* 3.03*  - Nephrology consulted and guided care - Recheck renal function in 1 week - Outpatient follow-up with nephrology - Avoid nephrotoxic meds   Metabolic acidosis: Improved -Recheck CMP in 1 week   Hypokalemia: Resolved   Anemia of chronic renal disease: Stable -Check CBC in 1 week   Diabetes mellitus type 2: A1c 7.0%. - Decrease Lantus  to 8 units daily based on inpatient requirement -Continue SSI-sensitive -Further adjustment as appropriate.  Essential hypertension: Normotensive -Continue metoprolol , Imdur  and Lasix  per nephrology. -ARB discontinued due to AKI   Class II obesity: Underestimated by left BKA. Body mass index is 35.88 kg/m.  Time spent 35 minutes  Vital signs Vitals:   06/05/24 1411 06/05/24 1956  06/06/24 0603 06/06/24 0821  BP: 119/80 132/79 113/71 129/69  Pulse: 75 77 75 76  Temp: 98.2 F (36.8 C) 98.4 F (36.9 C) 98.2 F (36.8 C)   Resp: 20 18 15 16   Height:      Weight:      SpO2: 99% 100% 100% 100%  TempSrc: Oral     BMI (Calculated):         Discharge exam  GENERAL: No apparent distress.  Nontoxic. HEENT: MMM.  Vision and hearing grossly intact.  NECK: Supple.  No apparent JVD.  RESP:  No IWOB.  Fair aeration bilaterally. CVS:  RRR. Heart sounds normal.  ABD/GI/GU: BS+. Abd soft, NTND.  MSK/EXT:  Moves extremities.  Shrinker over left BKA.  Right TMA (old) NEURO: Awake and alert. Oriented appropriately.  No apparent focal neuro deficit. PSYCH: Calm. Normal affect.   Discharge Instructions Discharge Instructions     Diet - low sodium heart healthy   Complete by: As directed    Diet Carb Modified   Complete by: As directed    Increase activity slowly   Complete by: As directed       Allergies as of 06/06/2024       Reactions   Amlodipine  Besylate    Other Reaction(s): leg cramps   Chlorthalidone Other (See Comments)   Causes dehydration, kidneys shut down Other Reaction(s): Kidney Failure        Medication List     STOP taking these medications    Lantus  SoloStar 100 UNIT/ML Solostar Pen Generic drug: insulin  glargine Replaced by: insulin  glargine-yfgn 100 UNIT/ML injection   losartan  25 MG tablet Commonly known as: COZAAR        TAKE these medications    acetaminophen  325 MG tablet Commonly known as: TYLENOL  Take 2 tablets (650 mg total) by mouth every 6 (six) hours as needed for mild pain (or Fever >/= 101).   allopurinol  100 MG tablet Commonly known as: ZYLOPRIM  Take 0.5 tablets (50 mg total) by mouth daily. What changed: how much to take   aspirin  EC 81 MG tablet Take 1 tablet (81 mg total) by mouth daily. What changed: when to take this   BD Pen Needle Nano U/F 32G X 4 MM Misc Generic drug: Insulin  Pen Needle Use with  Lantus    Blood Glucose Monitor System w/Device Kit Use in the morning, at noon, and at bedtime.   Brilinta  90 MG Tabs tablet Generic drug: ticagrelor  Take 1 tablet (90 mg total) by mouth 2 (two) times daily.   cyclobenzaprine  10 MG tablet Commonly known as: FLEXERIL  Take 1 tablet (10 mg total) by mouth 3 (three) times daily as needed.   DULoxetine  20 MG capsule Commonly known as: CYMBALTA  Take 1 capsule (20 mg total) by mouth 2 (two) times daily.   famotidine  20 MG tablet Commonly known as: PEPCID  Take 1 tablet (20 mg total) by mouth at bedtime.   FreeStyle Libre 3 Plus Sensor Misc 1 each by Does not apply route every 14 (fourteen) days.   FreeStyle Libre 3 Reader [employer] Use as advised   furosemide  40 MG tablet Commonly known as: LASIX  Take 1 tablet (40 mg total) by mouth 2 (two) times daily.   gabapentin  100 MG capsule Commonly known as: NEURONTIN  Take 1 capsule (100 mg total) by mouth at bedtime.   insulin  glargine-yfgn 100 UNIT/ML injection Commonly known as: SEMGLEE  Inject 0.08 mLs (8  Units total) into the skin daily. Replaces: Lantus  SoloStar 100 UNIT/ML Solostar Pen   insulin  lispro 100 UNIT/ML KwikPen Commonly known as: HumaLOG  KwikPen Inject 5-14 Units into the skin 3 (three) times daily with meals. Inject 5 units with meals, add the following per glucose level. CBG 70 - 120: 0 units CBG 121 - 150: 1 unit CBG 151 - 200: 2 units CBG 201 - 250: 3 units CBG 251 - 300: 5 units CBG 301 - 350: 7 units CBG 351 - 400: 9 units MDD: 50 units   isosorbide  mononitrate 60 MG 24 hr tablet Commonly known as: IMDUR  Take 2 tablets by mouth in the morning AND 1 tablet every evening. What changed: See the new instructions.   latanoprost  0.005 % ophthalmic solution Commonly known as: XALATAN  Place 1 drop into both eyes at bedtime.   metoprolol  succinate 100 MG 24 hr tablet Commonly known as: TOPROL -XL Take 1 tablet (100 mg total) by mouth daily. What changed: when to take  this   nitroGLYCERIN  0.4 MG SL tablet Commonly known as: NITROSTAT  Place 1 tablet (0.4 mg total) under the tongue every 5 (five) minutes as needed for chest pain.   oxyCODONE  5 MG immediate release tablet Commonly known as: Oxy IR/ROXICODONE  Take 1 tablet (5 mg total) by mouth every 8 (eight) hours as needed for up to 5 days for severe pain (pain score 7-10).   Ozempic  (1 MG/DOSE) 4 MG/3ML Sopn Generic drug: Semaglutide  (1 MG/DOSE) Inject 1 mg into the skin once a week. What changed: additional instructions   pantoprazole  40 MG tablet Commonly known as: PROTONIX  Take 1 tablet (40 mg total) by mouth 2 (two) times daily.   rosuvastatin  10 MG tablet Commonly known as: CRESTOR  Take 1 tablet (10 mg total) by mouth daily. What changed:  medication strength how much to take   senna-docusate 8.6-50 MG tablet Commonly known as: Senokot-S Take 1-2 tablets by mouth 2 (two) times daily between meals as needed for mild constipation or moderate constipation.   SILVER  SULFADIAZINE  EX Apply 1 Application topically 3 (three) times a week. No set days for the patient.   tamsulosin  0.4 MG Caps capsule Commonly known as: FLOMAX  Take 1 capsule (0.4 mg total) by mouth daily after supper.        Consultations: Podiatry Vascular surgery Nephrology  Procedures/Studies: 6/19-left BKA   VAS US  ABI WITH/WO TBI Result Date: 05/27/2024  LOWER EXTREMITY DOPPLER STUDY Patient Name:  XSAVIER SEELEY Watson  Date of Exam:   05/27/2024 Medical Rec #: [id number]         Accession #:    [id number] Date of Birth: Oct 06, 1956         Patient Gender: M Patient Age:   5 years Exam Location:  [facility] Procedure:      VAS US  ABI WITH/WO TBI Referring Phys: MARSHA ADA --------------------------------------------------------------------------------  Indications: DM foot ulcer High Risk Factors: Hypertension, hyperlipidemia, Diabetes, no history of                    smoking, prior MI, coronary artery  disease. Other Factors: CHF, CKD, s/[name] RT TMA (2017).  Limitations: Today's exam was limited due to involuntary patient movement. Performing Technologist: Leigh Rom RVT, RDMS  Examination Guidelines: A complete evaluation includes at minimum, Doppler waveform signals and systolic blood pressure reading at the level of bilateral brachial, anterior tibial, and posterior tibial arteries, when vessel segments are accessible. Bilateral testing is considered an integral part of a  complete examination. Photoelectric Plethysmograph (PPG) waveforms and toe systolic pressure readings are included as required and additional duplex testing as needed. Limited examinations for reoccurring indications may be performed as noted.  ABI Findings: +--------+------------------+-----+----------+--------+ Right   Rt Pressure (mmHg)IndexWaveform  Comment  +--------+------------------+-----+----------+--------+ [id number]                    triphasic          +--------+------------------+-----+----------+--------+ PTA     163               1.16 triphasic          +--------+------------------+-----+----------+--------+ DP      153               1.09 monophasic         +--------+------------------+-----+----------+--------+ +---------+------------------+-----+---------+-------+ Left     Lt Pressure (mmHg)IndexWaveform Comment +---------+------------------+-----+---------+-------+ Brachial 140                    triphasic        +---------+------------------+-----+---------+-------+ PTA      156               1.11 triphasic        +---------+------------------+-----+---------+-------+ DP       142               1.01 biphasic         +---------+------------------+-----+---------+-------+ Great Toe89                0.64 Normal           +---------+------------------+-----+---------+-------+ +-------+-----------+-----------+------------+------------+ ABI/TBIToday's ABIToday's TBIPrevious  ABIPrevious TBI +-------+-----------+-----------+------------+------------+ Right  1.16       amo        [place]                       +-------+-----------+-----------+------------+------------+ Left   1.11       0.64       1.15        0.52         +-------+-----------+-----------+------------+------------+  Summary: Right: Resting right ankle-brachial index is within normal range. Left: Resting left ankle-brachial index is within normal range. The left toe-brachial index is abnormal. *See table(s) above for measurements and observations.  Electronically signed by Gaile New MD on 05/27/2024 at 7:20:51 PM.    Final    MR FOOT LEFT WO CONTRAST Result Date: 05/16/2024 CLINICAL DATA:  Foot pain swelling and foot ulcer. EXAM: MRI OF THE LEFT FOOT WITHOUT CONTRAST TECHNIQUE: Multiplanar, multisequence MR imaging of the left foot was performed. No intravenous contrast was administered. COMPARISON:  Prior MRI 11/06/2022 and multiple prior radiographs. The most recent is 05/11/2024 FINDINGS: Markedly progressive and severe midfoot degenerative disease likely neuropathic disease with erosive changes, destructive bony changes, joint space narrowing, midfoot collapse and pes planus. Remote surgical changes noted at the base of the fifth metatarsal with prior partial resection. Abnormal T1 and T2 signal intensity involving the lateral aspect of the cuboid. Could not exclude osteomyelitis. Diffuse cellulitis without discrete focal fluid collection to suggest a drainable abscess. Fatty atrophy of the foot musculature and moderate muscle edema suggesting myofasciitis without findings to suggest pyomyositis. IMPRESSION: 1. Markedly progressive and severe midfoot degenerative disease likely neuropathic disease with erosive changes, destructive bony changes, joint space narrowing, midfoot collapse and pes planus. 2. Abnormal T1 and T2 signal intensity involving the lateral aspect of the cuboid. Could not exclude  osteomyelitis. 3. Diffuse cellulitis  without discrete focal fluid collection to suggest a drainable abscess. 4. Fatty atrophy of the foot musculature and moderate muscle edema suggesting myofasciitis without findings to suggest pyomyositis. Electronically Signed   By: MYRTIS Stammer M.D.   On: 05/16/2024 16:32   DG Foot Complete Left Result Date: 05/11/2024 Please see detailed radiograph report in office note.  NM PET CT CARDIAC PERFUSION MULTI W/ABSOLUTE BLOODFLOW Result Date: 05/08/2024   LV perfusion is abnormal. There is evidence of ischemia. There is no evidence of infarction. Defect 1: There is a large defect with moderate reduction in uptake present in the apical to mid anterolateral and inferior location(s) that is reversible. There is normal wall motion in the defect area. Consistent with ischemia.   Rest left ventricular function is abnormal. Rest global function is moderately reduced. There were no regional wall motion abnormalities. Rest EF: 35%. Stress left ventricular function is abnormal. Stress global function is moderately reduced. There were no regional wall abnormalities. Stress EF: 37%. End diastolic cavity size is mildly enlarged. End systolic cavity size is mildly enlarged.   Myocardial blood flow was computed to be 0.60ml/g/min at rest and 1.01ml/g/min at stress. Global myocardial blood flow reserve was 1.68 and was abnormal.   Coronary calcium  assessment not performed due to prior revascularization. Multiple prior stents   Findings are consistent with ischemia. The study is high risk. based on TID and decreased EF   Ischemia noted in inferior and anterolateral regions with abnormal MBFR, TID and decreased EF suggests multivessel obstructive CAD CLINICAL DATA:  This over-read does not include interpretation of cardiac or coronary anatomy or pathology. The Cardiac PET CT interpretation by the cardiologist is attached. COMPARISON:  07/18/2016 chest CT FINDINGS: No pleural fluid.  Bibasilar  volume loss and mild scarring. Normal aortic caliber. Calcified mediastinal nodes are again identified. Normal imaged portions of the liver, spleen, stomach, gallbladder. Moderate bilateral gynecomastia.  Advanced thoracic spondylosis. IMPRESSION: No acute findings in the imaged extracardiac chest. Calcified mediastinal and hilar nodes are again identified. These could relate to old granulomatous disease or sarcoid. Gynecomastia. Electronically Signed   By: Rockey Kilts M.D.   On: 05/08/2024 11:49      The results of significant diagnostics from this hospitalization (including imaging, microbiology, ancillary and laboratory) are listed below for reference.     Microbiology: Recent Results (from the past 240 hours)  MRSA Next Gen by PCR, Nasal     Status: None   Collection Time: 05/30/24  8:16 AM   Specimen: Nasal Mucosa; Nasal Swab  Result Value Ref Range Status   MRSA by PCR Next Gen NOT DETECTED NOT DETECTED Final    Comment: (NOTE) The GeneXpert MRSA Assay (FDA approved for NASAL specimens only), is one component of a comprehensive MRSA colonization surveillance program. It is not intended to diagnose MRSA infection nor to guide or monitor treatment for MRSA infections. Test performance is not FDA approved in patients less than 35 years old. Performed at [facility], [address] [place], [place] [address]      Labs:  CBC: Recent Labs  Lab 05/31/24 0527  WBC 6.1  HGB 11.8*  HCT 37.4*  MCV 91.7  PLT 151   BMP &GFR Recent Labs  Lab 05/31/24 0527 06/01/24 0554  NA 137  --   K 3.9  --   CL 104  --   CO2 21*  --   GLUCOSE 110*  --   BUN 42*  --   CREATININE  3.19* 3.03*  CALCIUM  8.5*  --    Estimated Creatinine Clearance: 29.7 mL/min (A) (by C-G formula based on SCr of 3.03 mg/dL (H)). Liver & Pancreas: No results for input(s): AST, ALT, ALKPHOS, BILITOT, PROT, ALBUMIN in the last 168 hours. No results for input(s): LIPASE, AMYLASE in the  last 168 hours. No results for input(s): AMMONIA in the last 168 hours. Diabetic: No results for input(s): HGBA1C in the last 72 hours. Recent Labs  Lab 06/05/24 1130 06/05/24 1607 06/05/24 2046 06/06/24 0603 06/06/24 1107  GLUCAP 137* 279* 198* 127* 146*   Cardiac Enzymes: No results for input(s): CKTOTAL, CKMB, CKMBINDEX, TROPONINI in the last 168 hours. No results for input(s): PROBNP in the last 8760 hours. Coagulation Profile: No results for input(s): INR, PROTIME in the last 168 hours. Thyroid  Function Tests: No results for input(s): TSH, T4TOTAL, FREET4, T3FREE, THYROIDAB in the last 72 hours. Lipid Profile: No results for input(s): CHOL, HDL, LDLCALC, TRIG, CHOLHDL, LDLDIRECT in the last 72 hours. Anemia Panel: No results for input(s): VITAMINB12, FOLATE, FERRITIN, TIBC, IRON, RETICCTPCT in the last 72 hours. Urine analysis:    Component Value Date/Time   COLORURINE STRAW (A) 05/26/2024 1359   APPEARANCEUR CLEAR 05/26/2024 1359   LABSPEC 1.008 05/26/2024 1359   PHURINE 5.0 05/26/2024 1359   GLUCOSEU NEGATIVE 05/26/2024 1359   HGBUR MODERATE (A) 05/26/2024 1359   BILIRUBINUR NEGATIVE 05/26/2024 1359   KETONESUR NEGATIVE 05/26/2024 1359   PROTEINUR 100 (A) 05/26/2024 1359   UROBILINOGEN 0.2 11/01/2011 1410   NITRITE NEGATIVE 05/26/2024 1359   LEUKOCYTESUR NEGATIVE 05/26/2024 1359   Sepsis Labs: Invalid input(s): PROCALCITONIN, LACTICIDVEN   SIGNED:  Gursimran Litaker T Tacuma Graffam, MD  [facility] 06/06/2024, 2:52 PM

## 2024-06-07 DIAGNOSIS — E785 Hyperlipidemia, unspecified: Secondary | ICD-10-CM | POA: Diagnosis not present

## 2024-06-07 DIAGNOSIS — M109 Gout, unspecified: Secondary | ICD-10-CM | POA: Diagnosis not present

## 2024-06-07 DIAGNOSIS — I2510 Atherosclerotic heart disease of native coronary artery without angina pectoris: Secondary | ICD-10-CM | POA: Diagnosis not present

## 2024-06-07 DIAGNOSIS — K219 Gastro-esophageal reflux disease without esophagitis: Secondary | ICD-10-CM | POA: Diagnosis not present

## 2024-06-08 DIAGNOSIS — S81802D Unspecified open wound, left lower leg, subsequent encounter: Secondary | ICD-10-CM | POA: Diagnosis not present

## 2024-06-08 DIAGNOSIS — D631 Anemia in chronic kidney disease: Secondary | ICD-10-CM | POA: Diagnosis not present

## 2024-06-08 DIAGNOSIS — I5032 Chronic diastolic (congestive) heart failure: Secondary | ICD-10-CM | POA: Diagnosis not present

## 2024-06-08 DIAGNOSIS — N1832 Chronic kidney disease, stage 3b: Secondary | ICD-10-CM | POA: Diagnosis not present

## 2024-06-08 DIAGNOSIS — I25110 Atherosclerotic heart disease of native coronary artery with unstable angina pectoris: Secondary | ICD-10-CM | POA: Diagnosis not present

## 2024-06-11 ENCOUNTER — Ambulatory Visit

## 2024-06-13 DIAGNOSIS — Z4781 Encounter for orthopedic aftercare following surgical amputation: Secondary | ICD-10-CM | POA: Diagnosis not present

## 2024-06-13 DIAGNOSIS — Z89431 Acquired absence of right foot: Secondary | ICD-10-CM | POA: Diagnosis not present

## 2024-06-13 DIAGNOSIS — Z741 Need for assistance with personal care: Secondary | ICD-10-CM | POA: Diagnosis not present

## 2024-06-13 DIAGNOSIS — I5042 Chronic combined systolic (congestive) and diastolic (congestive) heart failure: Secondary | ICD-10-CM | POA: Diagnosis not present

## 2024-06-13 DIAGNOSIS — R2689 Other abnormalities of gait and mobility: Secondary | ICD-10-CM | POA: Diagnosis not present

## 2024-06-13 DIAGNOSIS — M6281 Muscle weakness (generalized): Secondary | ICD-10-CM | POA: Diagnosis not present

## 2024-06-13 DIAGNOSIS — R2681 Unsteadiness on feet: Secondary | ICD-10-CM | POA: Diagnosis not present

## 2024-06-18 DIAGNOSIS — E860 Dehydration: Secondary | ICD-10-CM | POA: Diagnosis not present

## 2024-06-18 DIAGNOSIS — N1832 Chronic kidney disease, stage 3b: Secondary | ICD-10-CM | POA: Diagnosis not present

## 2024-06-18 DIAGNOSIS — D631 Anemia in chronic kidney disease: Secondary | ICD-10-CM | POA: Diagnosis not present

## 2024-06-20 DIAGNOSIS — M6281 Muscle weakness (generalized): Secondary | ICD-10-CM | POA: Diagnosis not present

## 2024-06-20 DIAGNOSIS — S81802D Unspecified open wound, left lower leg, subsequent encounter: Secondary | ICD-10-CM | POA: Diagnosis not present

## 2024-06-20 DIAGNOSIS — Z89431 Acquired absence of right foot: Secondary | ICD-10-CM | POA: Diagnosis not present

## 2024-06-20 DIAGNOSIS — I5042 Chronic combined systolic (congestive) and diastolic (congestive) heart failure: Secondary | ICD-10-CM | POA: Diagnosis not present

## 2024-06-20 DIAGNOSIS — N1832 Chronic kidney disease, stage 3b: Secondary | ICD-10-CM | POA: Diagnosis not present

## 2024-06-20 DIAGNOSIS — I2510 Atherosclerotic heart disease of native coronary artery without angina pectoris: Secondary | ICD-10-CM | POA: Diagnosis not present

## 2024-06-20 DIAGNOSIS — I5032 Chronic diastolic (congestive) heart failure: Secondary | ICD-10-CM | POA: Diagnosis not present

## 2024-06-20 DIAGNOSIS — R2689 Other abnormalities of gait and mobility: Secondary | ICD-10-CM | POA: Diagnosis not present

## 2024-06-20 DIAGNOSIS — Z4781 Encounter for orthopedic aftercare following surgical amputation: Secondary | ICD-10-CM | POA: Diagnosis not present

## 2024-06-20 DIAGNOSIS — R2681 Unsteadiness on feet: Secondary | ICD-10-CM | POA: Diagnosis not present

## 2024-06-20 DIAGNOSIS — Z741 Need for assistance with personal care: Secondary | ICD-10-CM | POA: Diagnosis not present

## 2024-06-22 ENCOUNTER — Other Ambulatory Visit (HOSPITAL_COMMUNITY): Payer: Self-pay

## 2024-06-22 MED ORDER — ACETAMINOPHEN 325 MG PO TABS
650.0000 mg | ORAL_TABLET | Freq: Four times a day (QID) | ORAL | 0 refills | Status: DC | PRN
  Filled 2024-06-22: qty 40, 5d supply, fill #0

## 2024-06-22 MED ORDER — CYCLOBENZAPRINE HCL 10 MG PO TABS
10.0000 mg | ORAL_TABLET | Freq: Three times a day (TID) | ORAL | 0 refills | Status: DC | PRN
  Filled 2024-06-22: qty 15, 5d supply, fill #0

## 2024-06-26 ENCOUNTER — Other Ambulatory Visit (HOSPITAL_COMMUNITY): Payer: Self-pay

## 2024-06-26 ENCOUNTER — Ambulatory Visit: Attending: Vascular Surgery | Admitting: Physician Assistant

## 2024-06-26 VITALS — BP 143/79 | HR 92 | Temp 98.8°F | Resp 18 | Ht 69.0 in

## 2024-06-26 DIAGNOSIS — Z89512 Acquired absence of left leg below knee: Secondary | ICD-10-CM

## 2024-06-26 MED ORDER — DOXYCYCLINE HYCLATE 100 MG PO CAPS
100.0000 mg | ORAL_CAPSULE | Freq: Two times a day (BID) | ORAL | 0 refills | Status: AC
  Filled 2024-06-26: qty 28, 14d supply, fill #0

## 2024-06-26 MED ORDER — OXYCODONE HCL 5 MG PO TABS
5.0000 mg | ORAL_TABLET | Freq: Three times a day (TID) | ORAL | 0 refills | Status: DC | PRN
  Filled 2024-06-26: qty 15, 5d supply, fill #0

## 2024-06-26 NOTE — Progress Notes (Signed)
 POST OPERATIVE OFFICE NOTE    CC:  F/u for surgery  HPI:  This is a 68 y.o. male who is s/p left below the knee amputation by Dr. Magda on 05/31/2024.  He was discharged to a nursing facility.  He was recently discharged by the nursing facility last week.  He believes his incision is healing well.  He has not been dressing it on a daily basis.  He denies fevers, chills, nausea/vomiting.  Allergies  Allergen Reactions   Amlodipine  Besylate     Other Reaction(s): leg cramps   Chlorthalidone Other (See Comments)    Causes dehydration, kidneys shut down  Other Reaction(s): Kidney Failure    Current Outpatient Medications  Medication Sig Dispense Refill   acetaminophen  (TYLENOL ) 325 MG tablet Take 2 tablets (650 mg total) by mouth every 6 (six) hours as needed for mild pain (or Fever >/= 101).     acetaminophen  (TYLENOL ) 325 MG tablet Take 2 tablets (650 mg total) by mouth every 6 (six) hours as needed for pain and for fever greater than or equal to 101 40 tablet 0   allopurinol  (ZYLOPRIM ) 100 MG tablet Take 0.5 tablets (50 mg total) by mouth daily.     aspirin  EC 81 MG tablet Take 1 tablet (81 mg total) by mouth daily. (Patient taking differently: Take 81 mg by mouth in the morning.) 30 tablet 0   Blood Glucose Monitoring Suppl (BLOOD GLUCOSE MONITOR SYSTEM) w/Device KIT Use in the morning, at noon, and at bedtime. 1 kit 0   Continuous Glucose Receiver (FREESTYLE LIBRE 3 READER) DEVI Use as advised 1 each 0   Continuous Glucose Sensor (FREESTYLE LIBRE 3 PLUS SENSOR) MISC 1 each by Does not apply route every 14 (fourteen) days. 6 each 3   cyclobenzaprine  (FLEXERIL ) 10 MG tablet Take 1 tablet (10 mg total) by mouth 3 (three) times daily as needed. 15 tablet 0   cyclobenzaprine  (FLEXERIL ) 10 MG tablet Take 1 tablet (10 mg total) by mouth every 8 (eight) hours as needed for muscle spasms. 15 tablet 0   doxycycline  (VIBRAMYCIN ) 100 MG capsule Take 1 capsule (100 mg total) by mouth 2 (two) times  daily for 14 days. 28 capsule 0   DULoxetine  (CYMBALTA ) 20 MG capsule Take 1 capsule (20 mg total) by mouth 2 (two) times daily. 200 capsule 1   famotidine  (PEPCID ) 20 MG tablet Take 1 tablet (20 mg total) by mouth at bedtime. 30 tablet 0   furosemide  (LASIX ) 40 MG tablet Take 1 tablet (40 mg total) by mouth 2 (two) times daily. 200 tablet 0   gabapentin  (NEURONTIN ) 100 MG capsule Take 1 capsule (100 mg total) by mouth at bedtime. 100 capsule 1   insulin  glargine-yfgn (SEMGLEE ) 100 UNIT/ML injection Inject 0.08 mLs (8 Units total) into the skin daily.     insulin  lispro (HUMALOG  KWIKPEN) 100 UNIT/ML KwikPen Inject 5-14 Units into the skin 3 (three) times daily with meals. Inject 5 units with meals, add the following per glucose level. CBG 70 - 120: 0 units CBG 121 - 150: 1 unit CBG 151 - 200: 2 units CBG 201 - 250: 3 units CBG 251 - 300: 5 units CBG 301 - 350: 7 units CBG 351 - 400: 9 units MDD: 50 units 30 mL 1   Insulin  Pen Needle 32G X 4 MM MISC Use with Lantus  100 each 0   isosorbide  mononitrate (IMDUR ) 60 MG 24 hr tablet Take 2 tablets by mouth in the morning AND 1  tablet every evening. (Patient taking differently: Take 2 tablets by mouth in the morning AND then take 1 tablet by mouth every evening.) 270 tablet 3   latanoprost  (XALATAN ) 0.005 % ophthalmic solution Place 1 drop into both eyes at bedtime. 7.5 mL 1   metoprolol  succinate (TOPROL -XL) 100 MG 24 hr tablet Take 1 tablet (100 mg total) by mouth daily. (Patient taking differently: Take 100 mg by mouth in the morning.) 90 tablet 1   nitroGLYCERIN  (NITROSTAT ) 0.4 MG SL tablet Place 1 tablet (0.4 mg total) under the tongue every 5 (five) minutes as needed for chest pain. 25 tablet 3   pantoprazole  (PROTONIX ) 40 MG tablet Take 1 tablet (40 mg total) by mouth 2 (two) times daily. 60 tablet 0   rosuvastatin  (CRESTOR ) 10 MG tablet Take 1 tablet (10 mg total) by mouth daily.     Semaglutide , 1 MG/DOSE, (OZEMPIC , 1 MG/DOSE,) 4 MG/3ML SOPN Inject 1  mg into the skin once a week. (Patient taking differently: Inject 1 mg into the skin once a week. On Thursday) 9 mL 3   senna-docusate (SENOKOT-S) 8.6-50 MG tablet Take 1-2 tablets by mouth 2 (two) times daily between meals as needed for mild constipation or moderate constipation.     SILVER  SULFADIAZINE  EX Apply 1 Application topically 3 (three) times a week. No set days for the patient.     tamsulosin  (FLOMAX ) 0.4 MG CAPS capsule Take 1 capsule (0.4 mg total) by mouth daily after supper.     ticagrelor  (BRILINTA ) 90 MG TABS tablet Take 1 tablet (90 mg total) by mouth 2 (two) times daily. 180 tablet 3   oxyCODONE  (OXY IR/ROXICODONE ) 5 MG immediate release tablet Take 1 tablet (5 mg total) by mouth every 8 (eight) hours as needed for 5 days for severe pain. 15 tablet 0   No current facility-administered medications for this visit.     ROS:  See HPI  Physical Exam:  Vitals:   06/26/24 0955  BP: (!) 143/79  Pulse: 92  Resp: 18  Temp: 98.8 F (37.1 C)  TempSrc: Oral  Height: 5' 9 (1.753 m)    Incision:  L BKA pictured below Neuro: A&O    Assessment/Plan:  This is a 68 y.o. male who is s/p: Left below the knee amputation  Left below-knee amputation pictured above.  I asked Mr. Kertz to begin cleansing the incision and stump with antibacterial hand soap and water  on a daily basis.  He should still avoid soaking underwater.  He can leave the incision open to air when he is at home and cover with the compression sock when leaving the house.  He was prescribed doxycycline  due to the macerated appearance of the incision.  He will follow-up in 2 to 3 weeks for hopeful staple removal.  He will notify the office with any questions or concerns in the meantime.   Donnice Sender, PA-C [facility] [phone]  Clinic MD:  Magda

## 2024-06-27 DIAGNOSIS — E1140 Type 2 diabetes mellitus with diabetic neuropathy, unspecified: Secondary | ICD-10-CM | POA: Diagnosis not present

## 2024-06-27 DIAGNOSIS — Z89512 Acquired absence of left leg below knee: Secondary | ICD-10-CM | POA: Diagnosis not present

## 2024-06-27 DIAGNOSIS — N184 Chronic kidney disease, stage 4 (severe): Secondary | ICD-10-CM | POA: Diagnosis not present

## 2024-06-27 DIAGNOSIS — Z7985 Long-term (current) use of injectable non-insulin antidiabetic drugs: Secondary | ICD-10-CM | POA: Diagnosis not present

## 2024-06-27 DIAGNOSIS — Z794 Long term (current) use of insulin: Secondary | ICD-10-CM | POA: Diagnosis not present

## 2024-06-27 DIAGNOSIS — Z5982 Transportation insecurity: Secondary | ICD-10-CM | POA: Diagnosis not present

## 2024-06-27 DIAGNOSIS — Z4781 Encounter for orthopedic aftercare following surgical amputation: Secondary | ICD-10-CM | POA: Diagnosis not present

## 2024-06-27 DIAGNOSIS — D631 Anemia in chronic kidney disease: Secondary | ICD-10-CM | POA: Diagnosis not present

## 2024-06-27 DIAGNOSIS — Z7982 Long term (current) use of aspirin: Secondary | ICD-10-CM | POA: Diagnosis not present

## 2024-06-27 DIAGNOSIS — I130 Hypertensive heart and chronic kidney disease with heart failure and stage 1 through stage 4 chronic kidney disease, or unspecified chronic kidney disease: Secondary | ICD-10-CM | POA: Diagnosis not present

## 2024-06-27 DIAGNOSIS — I5030 Unspecified diastolic (congestive) heart failure: Secondary | ICD-10-CM | POA: Diagnosis not present

## 2024-06-27 DIAGNOSIS — E1122 Type 2 diabetes mellitus with diabetic chronic kidney disease: Secondary | ICD-10-CM | POA: Diagnosis not present

## 2024-06-27 DIAGNOSIS — E872 Acidosis, unspecified: Secondary | ICD-10-CM | POA: Diagnosis not present

## 2024-06-27 DIAGNOSIS — Z7902 Long term (current) use of antithrombotics/antiplatelets: Secondary | ICD-10-CM | POA: Diagnosis not present

## 2024-06-27 DIAGNOSIS — I2510 Atherosclerotic heart disease of native coronary artery without angina pectoris: Secondary | ICD-10-CM | POA: Diagnosis not present

## 2024-06-28 DIAGNOSIS — I5030 Unspecified diastolic (congestive) heart failure: Secondary | ICD-10-CM | POA: Diagnosis not present

## 2024-06-28 DIAGNOSIS — N184 Chronic kidney disease, stage 4 (severe): Secondary | ICD-10-CM | POA: Diagnosis not present

## 2024-06-28 DIAGNOSIS — I2510 Atherosclerotic heart disease of native coronary artery without angina pectoris: Secondary | ICD-10-CM | POA: Diagnosis not present

## 2024-06-28 DIAGNOSIS — Z794 Long term (current) use of insulin: Secondary | ICD-10-CM | POA: Diagnosis not present

## 2024-06-28 DIAGNOSIS — Z7902 Long term (current) use of antithrombotics/antiplatelets: Secondary | ICD-10-CM | POA: Diagnosis not present

## 2024-06-28 DIAGNOSIS — E872 Acidosis, unspecified: Secondary | ICD-10-CM | POA: Diagnosis not present

## 2024-06-28 DIAGNOSIS — E1140 Type 2 diabetes mellitus with diabetic neuropathy, unspecified: Secondary | ICD-10-CM | POA: Diagnosis not present

## 2024-06-28 DIAGNOSIS — Z89512 Acquired absence of left leg below knee: Secondary | ICD-10-CM | POA: Diagnosis not present

## 2024-06-28 DIAGNOSIS — I130 Hypertensive heart and chronic kidney disease with heart failure and stage 1 through stage 4 chronic kidney disease, or unspecified chronic kidney disease: Secondary | ICD-10-CM | POA: Diagnosis not present

## 2024-06-28 DIAGNOSIS — Z7985 Long-term (current) use of injectable non-insulin antidiabetic drugs: Secondary | ICD-10-CM | POA: Diagnosis not present

## 2024-06-28 DIAGNOSIS — Z5982 Transportation insecurity: Secondary | ICD-10-CM | POA: Diagnosis not present

## 2024-06-28 DIAGNOSIS — E1122 Type 2 diabetes mellitus with diabetic chronic kidney disease: Secondary | ICD-10-CM | POA: Diagnosis not present

## 2024-06-28 DIAGNOSIS — Z7982 Long term (current) use of aspirin: Secondary | ICD-10-CM | POA: Diagnosis not present

## 2024-06-28 DIAGNOSIS — D631 Anemia in chronic kidney disease: Secondary | ICD-10-CM | POA: Diagnosis not present

## 2024-06-28 DIAGNOSIS — Z4781 Encounter for orthopedic aftercare following surgical amputation: Secondary | ICD-10-CM | POA: Diagnosis not present

## 2024-06-29 DIAGNOSIS — Z89512 Acquired absence of left leg below knee: Secondary | ICD-10-CM | POA: Diagnosis not present

## 2024-06-29 DIAGNOSIS — E1159 Type 2 diabetes mellitus with other circulatory complications: Secondary | ICD-10-CM | POA: Diagnosis not present

## 2024-06-29 DIAGNOSIS — I10 Essential (primary) hypertension: Secondary | ICD-10-CM | POA: Diagnosis not present

## 2024-06-29 DIAGNOSIS — E7800 Pure hypercholesterolemia, unspecified: Secondary | ICD-10-CM | POA: Diagnosis not present

## 2024-06-29 DIAGNOSIS — N179 Acute kidney failure, unspecified: Secondary | ICD-10-CM | POA: Diagnosis not present

## 2024-06-29 DIAGNOSIS — Z742 Need for assistance at home and no other household member able to render care: Secondary | ICD-10-CM | POA: Diagnosis not present

## 2024-06-29 DIAGNOSIS — I2510 Atherosclerotic heart disease of native coronary artery without angina pectoris: Secondary | ICD-10-CM | POA: Diagnosis not present

## 2024-07-02 DIAGNOSIS — Z89512 Acquired absence of left leg below knee: Secondary | ICD-10-CM | POA: Diagnosis not present

## 2024-07-02 DIAGNOSIS — E1122 Type 2 diabetes mellitus with diabetic chronic kidney disease: Secondary | ICD-10-CM | POA: Diagnosis not present

## 2024-07-02 DIAGNOSIS — Z7902 Long term (current) use of antithrombotics/antiplatelets: Secondary | ICD-10-CM | POA: Diagnosis not present

## 2024-07-02 DIAGNOSIS — Z5982 Transportation insecurity: Secondary | ICD-10-CM | POA: Diagnosis not present

## 2024-07-02 DIAGNOSIS — Z7985 Long-term (current) use of injectable non-insulin antidiabetic drugs: Secondary | ICD-10-CM | POA: Diagnosis not present

## 2024-07-02 DIAGNOSIS — E1140 Type 2 diabetes mellitus with diabetic neuropathy, unspecified: Secondary | ICD-10-CM | POA: Diagnosis not present

## 2024-07-02 DIAGNOSIS — N184 Chronic kidney disease, stage 4 (severe): Secondary | ICD-10-CM | POA: Diagnosis not present

## 2024-07-02 DIAGNOSIS — Z794 Long term (current) use of insulin: Secondary | ICD-10-CM | POA: Diagnosis not present

## 2024-07-02 DIAGNOSIS — D631 Anemia in chronic kidney disease: Secondary | ICD-10-CM | POA: Diagnosis not present

## 2024-07-02 DIAGNOSIS — I130 Hypertensive heart and chronic kidney disease with heart failure and stage 1 through stage 4 chronic kidney disease, or unspecified chronic kidney disease: Secondary | ICD-10-CM | POA: Diagnosis not present

## 2024-07-02 DIAGNOSIS — I5030 Unspecified diastolic (congestive) heart failure: Secondary | ICD-10-CM | POA: Diagnosis not present

## 2024-07-02 DIAGNOSIS — I2510 Atherosclerotic heart disease of native coronary artery without angina pectoris: Secondary | ICD-10-CM | POA: Diagnosis not present

## 2024-07-02 DIAGNOSIS — E872 Acidosis, unspecified: Secondary | ICD-10-CM | POA: Diagnosis not present

## 2024-07-02 DIAGNOSIS — Z4781 Encounter for orthopedic aftercare following surgical amputation: Secondary | ICD-10-CM | POA: Diagnosis not present

## 2024-07-02 DIAGNOSIS — Z7982 Long term (current) use of aspirin: Secondary | ICD-10-CM | POA: Diagnosis not present

## 2024-07-05 DIAGNOSIS — N184 Chronic kidney disease, stage 4 (severe): Secondary | ICD-10-CM | POA: Diagnosis not present

## 2024-07-05 DIAGNOSIS — E1140 Type 2 diabetes mellitus with diabetic neuropathy, unspecified: Secondary | ICD-10-CM | POA: Diagnosis not present

## 2024-07-05 DIAGNOSIS — I5030 Unspecified diastolic (congestive) heart failure: Secondary | ICD-10-CM | POA: Diagnosis not present

## 2024-07-05 DIAGNOSIS — Z7985 Long-term (current) use of injectable non-insulin antidiabetic drugs: Secondary | ICD-10-CM | POA: Diagnosis not present

## 2024-07-05 DIAGNOSIS — E872 Acidosis, unspecified: Secondary | ICD-10-CM | POA: Diagnosis not present

## 2024-07-05 DIAGNOSIS — E1122 Type 2 diabetes mellitus with diabetic chronic kidney disease: Secondary | ICD-10-CM | POA: Diagnosis not present

## 2024-07-05 DIAGNOSIS — Z5982 Transportation insecurity: Secondary | ICD-10-CM | POA: Diagnosis not present

## 2024-07-05 DIAGNOSIS — Z794 Long term (current) use of insulin: Secondary | ICD-10-CM | POA: Diagnosis not present

## 2024-07-05 DIAGNOSIS — Z4781 Encounter for orthopedic aftercare following surgical amputation: Secondary | ICD-10-CM | POA: Diagnosis not present

## 2024-07-05 DIAGNOSIS — Z89512 Acquired absence of left leg below knee: Secondary | ICD-10-CM | POA: Diagnosis not present

## 2024-07-05 DIAGNOSIS — D631 Anemia in chronic kidney disease: Secondary | ICD-10-CM | POA: Diagnosis not present

## 2024-07-05 DIAGNOSIS — Z7902 Long term (current) use of antithrombotics/antiplatelets: Secondary | ICD-10-CM | POA: Diagnosis not present

## 2024-07-05 DIAGNOSIS — Z7982 Long term (current) use of aspirin: Secondary | ICD-10-CM | POA: Diagnosis not present

## 2024-07-05 DIAGNOSIS — I2510 Atherosclerotic heart disease of native coronary artery without angina pectoris: Secondary | ICD-10-CM | POA: Diagnosis not present

## 2024-07-05 DIAGNOSIS — I130 Hypertensive heart and chronic kidney disease with heart failure and stage 1 through stage 4 chronic kidney disease, or unspecified chronic kidney disease: Secondary | ICD-10-CM | POA: Diagnosis not present

## 2024-07-06 ENCOUNTER — Encounter (HOSPITAL_COMMUNITY): Payer: Self-pay | Admitting: Emergency Medicine

## 2024-07-06 ENCOUNTER — Other Ambulatory Visit: Payer: Self-pay

## 2024-07-06 ENCOUNTER — Emergency Department (HOSPITAL_COMMUNITY)
Admission: EM | Admit: 2024-07-06 | Discharge: 2024-07-06 | Disposition: A | Discharge status: Home / Self Care | Attending: Emergency Medicine | Admitting: Emergency Medicine

## 2024-07-06 ENCOUNTER — Telehealth: Payer: Self-pay

## 2024-07-06 ENCOUNTER — Emergency Department (HOSPITAL_COMMUNITY)

## 2024-07-06 DIAGNOSIS — M7989 Other specified soft tissue disorders: Secondary | ICD-10-CM | POA: Insufficient documentation

## 2024-07-06 DIAGNOSIS — Z89512 Acquired absence of left leg below knee: Secondary | ICD-10-CM | POA: Diagnosis not present

## 2024-07-06 DIAGNOSIS — Z7985 Long-term (current) use of injectable non-insulin antidiabetic drugs: Secondary | ICD-10-CM | POA: Diagnosis not present

## 2024-07-06 DIAGNOSIS — E109 Type 1 diabetes mellitus without complications: Secondary | ICD-10-CM | POA: Diagnosis not present

## 2024-07-06 DIAGNOSIS — N1832 Chronic kidney disease, stage 3b: Secondary | ICD-10-CM | POA: Insufficient documentation

## 2024-07-06 DIAGNOSIS — Z7982 Long term (current) use of aspirin: Secondary | ICD-10-CM | POA: Diagnosis not present

## 2024-07-06 DIAGNOSIS — Z5189 Encounter for other specified aftercare: Secondary | ICD-10-CM

## 2024-07-06 DIAGNOSIS — Z794 Long term (current) use of insulin: Secondary | ICD-10-CM | POA: Insufficient documentation

## 2024-07-06 DIAGNOSIS — E872 Acidosis, unspecified: Secondary | ICD-10-CM | POA: Diagnosis not present

## 2024-07-06 DIAGNOSIS — E1140 Type 2 diabetes mellitus with diabetic neuropathy, unspecified: Secondary | ICD-10-CM | POA: Diagnosis not present

## 2024-07-06 DIAGNOSIS — T8131XA Disruption of external operation (surgical) wound, not elsewhere classified, initial encounter: Secondary | ICD-10-CM | POA: Diagnosis not present

## 2024-07-06 DIAGNOSIS — I130 Hypertensive heart and chronic kidney disease with heart failure and stage 1 through stage 4 chronic kidney disease, or unspecified chronic kidney disease: Secondary | ICD-10-CM | POA: Diagnosis not present

## 2024-07-06 DIAGNOSIS — Z4781 Encounter for orthopedic aftercare following surgical amputation: Secondary | ICD-10-CM | POA: Diagnosis not present

## 2024-07-06 DIAGNOSIS — Z4800 Encounter for change or removal of nonsurgical wound dressing: Secondary | ICD-10-CM | POA: Insufficient documentation

## 2024-07-06 DIAGNOSIS — Z5982 Transportation insecurity: Secondary | ICD-10-CM | POA: Diagnosis not present

## 2024-07-06 DIAGNOSIS — I2510 Atherosclerotic heart disease of native coronary artery without angina pectoris: Secondary | ICD-10-CM | POA: Diagnosis not present

## 2024-07-06 DIAGNOSIS — Z4801 Encounter for change or removal of surgical wound dressing: Secondary | ICD-10-CM | POA: Diagnosis not present

## 2024-07-06 DIAGNOSIS — I509 Heart failure, unspecified: Secondary | ICD-10-CM | POA: Insufficient documentation

## 2024-07-06 DIAGNOSIS — Z79899 Other long term (current) drug therapy: Secondary | ICD-10-CM | POA: Diagnosis not present

## 2024-07-06 DIAGNOSIS — R58 Hemorrhage, not elsewhere classified: Secondary | ICD-10-CM | POA: Diagnosis not present

## 2024-07-06 DIAGNOSIS — Z7902 Long term (current) use of antithrombotics/antiplatelets: Secondary | ICD-10-CM | POA: Diagnosis not present

## 2024-07-06 DIAGNOSIS — W19XXXA Unspecified fall, initial encounter: Secondary | ICD-10-CM | POA: Diagnosis not present

## 2024-07-06 DIAGNOSIS — D631 Anemia in chronic kidney disease: Secondary | ICD-10-CM | POA: Diagnosis not present

## 2024-07-06 DIAGNOSIS — E1122 Type 2 diabetes mellitus with diabetic chronic kidney disease: Secondary | ICD-10-CM | POA: Diagnosis not present

## 2024-07-06 DIAGNOSIS — N184 Chronic kidney disease, stage 4 (severe): Secondary | ICD-10-CM | POA: Diagnosis not present

## 2024-07-06 DIAGNOSIS — T8130XA Disruption of wound, unspecified, initial encounter: Secondary | ICD-10-CM

## 2024-07-06 DIAGNOSIS — R6889 Other general symptoms and signs: Secondary | ICD-10-CM | POA: Diagnosis not present

## 2024-07-06 DIAGNOSIS — M1712 Unilateral primary osteoarthritis, left knee: Secondary | ICD-10-CM | POA: Diagnosis not present

## 2024-07-06 DIAGNOSIS — I5030 Unspecified diastolic (congestive) heart failure: Secondary | ICD-10-CM | POA: Diagnosis not present

## 2024-07-06 DIAGNOSIS — Z743 Need for continuous supervision: Secondary | ICD-10-CM | POA: Diagnosis not present

## 2024-07-06 LAB — CBC WITH DIFFERENTIAL/PLATELET
Abs Immature Granulocytes: 0.09 K/uL — ABNORMAL HIGH (ref 0.00–0.07)
Basophils Absolute: 0 K/uL (ref 0.0–0.1)
Basophils Relative: 0 %
Eosinophils Absolute: 0.1 K/uL (ref 0.0–0.5)
Eosinophils Relative: 1 %
HCT: 36.8 % — ABNORMAL LOW (ref 39.0–52.0)
Hemoglobin: 11.3 g/dL — ABNORMAL LOW (ref 13.0–17.0)
Immature Granulocytes: 1 %
Lymphocytes Relative: 18 %
Lymphs Abs: 1.6 K/uL (ref 0.7–4.0)
MCH: 28.4 pg (ref 26.0–34.0)
MCHC: 30.7 g/dL (ref 30.0–36.0)
MCV: 92.5 fL (ref 80.0–100.0)
Monocytes Absolute: 0.8 K/uL (ref 0.1–1.0)
Monocytes Relative: 9 %
Neutro Abs: 6.3 K/uL (ref 1.7–7.7)
Neutrophils Relative %: 71 %
Platelets: 214 K/uL (ref 150–400)
RBC: 3.98 MIL/uL — ABNORMAL LOW (ref 4.22–5.81)
RDW: 15 % (ref 11.5–15.5)
WBC: 8.8 K/uL (ref 4.0–10.5)
nRBC: 0 % (ref 0.0–0.2)

## 2024-07-06 LAB — BASIC METABOLIC PANEL WITH GFR
Anion gap: 11 (ref 5–15)
BUN: 60 mg/dL — ABNORMAL HIGH (ref 8–23)
CO2: 21 mmol/L — ABNORMAL LOW (ref 22–32)
Calcium: 8.9 mg/dL (ref 8.9–10.3)
Chloride: 107 mmol/L (ref 98–111)
Creatinine, Ser: 2.95 mg/dL — ABNORMAL HIGH (ref 0.61–1.24)
GFR, Estimated: 23 mL/min — ABNORMAL LOW (ref >60)
Glucose, Bld: 191 mg/dL — ABNORMAL HIGH (ref 70–99)
Potassium: 4.4 mmol/L (ref 3.5–5.1)
Sodium: 139 mmol/L (ref 135–145)

## 2024-07-06 NOTE — ED Provider Triage Note (Signed)
 Emergency Medicine Provider Triage Evaluation Note  Joshua [name] , a 68 y.o. male  was evaluated in triage.  Pt complains of wound check, sent by vascular surgery.   Review of Systems  Positive: Wound check Negative: fever  Physical Exam  BP 128/70 (BP Location: Left Arm)   Pulse 89   Temp 99.2 F (37.3 C) (Oral)   Resp 18   Ht 5' 9 (1.753 m)   Wt 106.1 kg   SpO2 100%   BMI 34.56 kg/m  Gen:   Awake, no distress   Resp:  Normal effort  MSK:   Moves extremities without difficulty  Other:    Medical Decision Making  Medically screening exam initiated at 2:15 PM.  Appropriate orders placed.  Joshua [name] was informed that the remainder of the evaluation will be completed by another provider, this initial triage assessment does not replace that evaluation, and the importance of remaining in the ED until their evaluation is complete.     Joshua Warren SAILOR, PA-C 07/06/24 1415

## 2024-07-06 NOTE — ED Provider Notes (Signed)
 [place] EMERGENCY DEPARTMENT AT [facility] Provider Note   CSN: [id number] Arrival date & time: 07/06/24  1405   Patient presents with: Wound Check   Joshua [name] is a 68 y.o. male with PMHx of OSA on CPAP, GERD, HTN, HLD, CAD, CHF, s3bCKD, IDDM, and osteomyelitis of the L foot s/p BKA 05/31/24 who presents for evaluation of his post-op LLE wound. Patient states that he fell yesterday in his bathroom and struck his L BKA site on the edge of the sink cabinet and then on the ground in the fall. He noted some bleeding from the wound which he controlled with direct pressure and paper towels. His home health OT came today to work with him and noticed the wound had some residual bleeding, so they recommended he come to the ED. Patient otherwise denies significant drainage from the wound and feels otherwise at his baseline.   Prior to Admission medications   Medication Sig Start Date End Date Taking? Authorizing Provider  acetaminophen  (TYLENOL ) 325 MG tablet Take 2 tablets (650 mg total) by mouth every 6 (six) hours as needed for mild pain (or Fever >/= 101). 07/13/23   Fernand Prost, MD  acetaminophen  (TYLENOL ) 325 MG tablet Take 2 tablets (650 mg total) by mouth every 6 (six) hours as needed for pain and for fever greater than or equal to 101 06/22/24     allopurinol  (ZYLOPRIM ) 100 MG tablet Take 0.5 tablets (50 mg total) by mouth daily. 06/06/24   Kathrin Mignon DASEN, MD  aspirin  EC 81 MG tablet Take 1 tablet (81 mg total) by mouth daily. Patient taking differently: Take 81 mg by mouth in the morning. 07/13/23   Fernand Prost, MD  Blood Glucose Monitoring Suppl (BLOOD GLUCOSE MONITOR SYSTEM) w/Device KIT Use in the morning, at noon, and at bedtime. 07/27/23   Marylu Gee, DO  Continuous Glucose Receiver (FREESTYLE LIBRE 3 READER) DEVI Use as advised 08/31/23   Trixie File, MD  Continuous Glucose Sensor (FREESTYLE LIBRE 3 PLUS SENSOR) MISC 1 each by Does not apply route every 14  (fourteen) days. 08/31/23   Trixie File, MD  cyclobenzaprine  (FLEXERIL ) 10 MG tablet Take 1 tablet (10 mg total) by mouth 3 (three) times daily as needed. 03/29/24   Gladis Elsie BROCKS, PA-C  cyclobenzaprine  (FLEXERIL ) 10 MG tablet Take 1 tablet (10 mg total) by mouth every 8 (eight) hours as needed for muscle spasms. 06/22/24     doxycycline  (VIBRAMYCIN ) 100 MG capsule Take 1 capsule (100 mg total) by mouth 2 (two) times daily for 14 days. 06/26/24 07/10/24  Bethanie Cough, PA-C  DULoxetine  (CYMBALTA ) 20 MG capsule Take 1 capsule (20 mg total) by mouth 2 (two) times daily. 05/24/24     famotidine  (PEPCID ) 20 MG tablet Take 1 tablet (20 mg total) by mouth at bedtime. 07/13/23 06/26/24  Fernand Prost, MD  furosemide  (LASIX ) 40 MG tablet Take 1 tablet (40 mg total) by mouth 2 (two) times daily. 05/24/24     gabapentin  (NEURONTIN ) 100 MG capsule Take 1 capsule (100 mg total) by mouth at bedtime. 05/24/24     insulin  glargine-yfgn (SEMGLEE ) 100 UNIT/ML injection Inject 0.08 mLs (8 Units total) into the skin daily. 06/06/24   Gonfa, Taye T, MD  insulin  lispro (HUMALOG  KWIKPEN) 100 UNIT/ML KwikPen Inject 5-14 Units into the skin 3 (three) times daily with meals. Inject 5 units with meals, add the following per glucose level. CBG 70 - 120: 0 units CBG 121 - 150: 1 unit  CBG 151 - 200: 2 units CBG 201 - 250: 3 units CBG 251 - 300: 5 units CBG 301 - 350: 7 units CBG 351 - 400: 9 units MDD: 50 units 02/23/24   Trixie File, MD  Insulin  Pen Needle 32G X 4 MM MISC Use with Lantus  07/13/23     isosorbide  mononitrate (IMDUR ) 60 MG 24 hr tablet Take 2 tablets by mouth in the morning AND 1 tablet every evening. Patient taking differently: Take 2 tablets by mouth in the morning AND then take 1 tablet by mouth every evening. 10/15/23   Fernand Prost, MD  latanoprost  (XALATAN ) 0.005 % ophthalmic solution Place 1 drop into both eyes at bedtime. 10/15/23   Fernand Prost, MD  metoprolol  succinate (TOPROL -XL) 100 MG 24 hr tablet  Take 1 tablet (100 mg total) by mouth daily. Patient taking differently: Take 100 mg by mouth in the morning. 05/24/24     nitroGLYCERIN  (NITROSTAT ) 0.4 MG SL tablet Place 1 tablet (0.4 mg total) under the tongue every 5 (five) minutes as needed for chest pain. 07/13/23   Fernand Prost, MD  oxyCODONE  (OXY IR/ROXICODONE ) 5 MG immediate release tablet Take 1 tablet (5 mg total) by mouth every 8 (eight) hours as needed for up to 5 days for severe pain (pain score 7-10). 06/06/24   Gonfa, Taye T, MD  pantoprazole  (PROTONIX ) 40 MG tablet Take 1 tablet (40 mg total) by mouth 2 (two) times daily. 07/13/23 06/26/24  Fernand Prost, MD  rosuvastatin  (CRESTOR ) 10 MG tablet Take 1 tablet (10 mg total) by mouth daily. 06/06/24   Gonfa, Taye T, MD  Semaglutide , 1 MG/DOSE, (OZEMPIC , 1 MG/DOSE,) 4 MG/3ML SOPN Inject 1 mg into the skin once a week. Patient taking differently: Inject 1 mg into the skin once a week. On Thursday 08/31/23   Trixie File, MD  senna-docusate (SENOKOT-S) 8.6-50 MG tablet Take 1-2 tablets by mouth 2 (two) times daily between meals as needed for mild constipation or moderate constipation. 06/06/24   Kathrin Mignon DASEN, MD  SILVER  SULFADIAZINE  EX Apply 1 Application topically 3 (three) times a week. No set days for the patient.    [provider]  tamsulosin  (FLOMAX ) 0.4 MG CAPS capsule Take 1 capsule (0.4 mg total) by mouth daily after supper. 06/06/24   Gonfa, Taye T, MD  ticagrelor  (BRILINTA ) 90 MG TABS tablet Take 1 tablet (90 mg total) by mouth 2 (two) times daily. 10/15/23   Fernand Prost, MD    Allergies: Amlodipine  besylate and Chlorthalidone     Updated Vital Signs BP 130/78 (BP Location: Left Arm)   Pulse 79   Temp 97.6 F (36.4 C) (Oral)   Resp 20   Ht 5' 9 (1.753 m)   Wt 106.1 kg   SpO2 100%   BMI 34.56 kg/m   Physical Exam Vitals reviewed.  Constitutional:      General: He is not in acute distress.    Appearance: He is not toxic-appearing or diaphoretic.  HENT:      Head: Normocephalic and atraumatic.  Eyes:     Extraocular Movements: Extraocular movements intact.     Conjunctiva/sclera: Conjunctivae normal.  Cardiovascular:     Rate and Rhythm: Normal rate and regular rhythm.     Heart sounds: No murmur heard.    No gallop.  Pulmonary:     Effort: Pulmonary effort is normal. No respiratory distress.  Abdominal:     General: Abdomen is flat.     Palpations: Abdomen is soft.  Tenderness: There is no abdominal tenderness. There is no guarding.  Musculoskeletal:        General: Normal range of motion.     Comments: Left BKA with wound as shown in the below photos, some dehiscence and near-dried blood at the middle of the wound, no active oozing, no purulent drainage from remainder of wound. No significant tenderness  Skin:    General: Skin is warm and dry.  Neurological:     Mental Status: He is alert and oriented to person, place, and time. Mental status is at baseline.        (all labs ordered are listed, but only abnormal results are displayed) Labs Reviewed  CBC WITH DIFFERENTIAL/PLATELET - Abnormal; Notable for the following components:      Result Value   RBC 3.98 (*)    Hemoglobin 11.3 (*)    HCT 36.8 (*)    Abs Immature Granulocytes 0.09 (*)    All other components within normal limits  BASIC METABOLIC PANEL WITH GFR - Abnormal; Notable for the following components:   CO2 21 (*)    Glucose, Bld 191 (*)    BUN 60 (*)    Creatinine, Ser 2.95 (*)    GFR, Estimated 23 (*)    All other components within normal limits    EKG: None  Radiology: DG Knee Complete 4 Views Left Result Date: 07/06/2024 CLINICAL DATA:  recent amputation, worseing pain. EXAM: LEFT KNEE - COMPLETE 4+ VIEW COMPARISON:  None Available. FINDINGS: Recent left below-knee amputation. The resection margin at the junction of upper 1/3 and lower 2/3 of tibia and fibula are sharp. No focal bone erosions. There is faint soft tissue calcification near the resection  site, likely due to developing heterotopic ossification. No acute fracture or dislocation. No aggressive osseous lesion. There are degenerative changes of the knee joint in the form of mildly reduced tibio-femoral compartment joint space and osteophytosis. No knee effusion or focal soft tissue swelling. No radiopaque foreign bodies. IMPRESSION: 1. No acute osseous abnormality of the left knee joint. 2. Recent left below-knee amputation. No focal bone erosions. 3. Mild degenerative changes of the knee joint. Electronically Signed   By: Ree Molt M.D.   On: 07/06/2024 15:11     Medications Ordered in the ED - No data to display  Clinical Course as of 07/07/24 0255  Fri Jul 06, 2024  1803 DG Knee Complete 4 Views Left No acute osseous abnormality of the left knee joint. Recent left below-knee amputation. No focal bone erosions. Mild degenerative changes of the knee joint.   [AD]  Sat Jul 07, 2024  0252 Basic metabolic panel(!) Cr at baseline 2.95, BG 191, otherwise unremarkable [AD]  0252 CBC with Differential(!) Unremarkable/at baseline [AD]    Clinical Course User Index [AD] Raoul Rake, MD    Medical Decision Making Patient with the above history is presenting for evaluation of his left BKA post-op wound after he fell and struck the area on furniture and the ground in the fall. He had increased bleeding last night from the wound after the injury and on exam today has very minimal residual active bleeding from the center of the wound, otherwise the wound appears to be healing well with no signs of acute infection.   XR L knee ordered in triage resulted as above with no significant acute findings. Basic labs overall unremarkable. Given patient is a week out from the operation and has an overall hemostatic/non-acutely infected wound without severe/acute dehiscence  on my exam, will plan for discharge from the ED with close outpatient f/u with vascular surgery team as soon as  possible for further wound evaluation. Patient agreeable with this plan and was discharged in stable condition.  Amount and/or Complexity of Data Reviewed Labs:  Decision-making details documented in ED Course. Radiology:  Decision-making details documented in ED Course.     Final diagnoses:  Wound dehiscence  Visit for wound check    ED Discharge Orders     None          Raoul Rake, MD 07/07/24 9743    Freddi Hamilton, MD 07/09/24 [phone]

## 2024-07-06 NOTE — Discharge Instructions (Addendum)
 You were seen today for wound check of your left post-op wound. While you were here we monitored your vitals, performed a physical exam, and labs + imaging of the leg. These were all reassuring and there is no indication for any further testing or intervention in the emergency department at this time.   Things to do:  - Call your vascular surgery office to schedule follow up appointment for wound check in the next few business days - Follow up with your primary care provider within the next 1-2 weeks as needed  Return to the emergency department if you have any new or worsening symptoms, or if you have any other serious medical concerns.

## 2024-07-06 NOTE — Telephone Encounter (Signed)
 Geni, OT [facility] called reporting pt's L BKA has two places that are bleeding. Patient reported he fell on his amputation site. Pt sent picture through his MyChart and was advised to go to the ED.   Pt agreed to go to the ED.

## 2024-07-06 NOTE — ED Triage Notes (Signed)
 Pt was BIB [facility] with concerns from home health of an open wound on his recent left BKA. Pt says the wound has been draining a little since last week.   136 palpated BP 98% RA CBG 174  80 HR

## 2024-07-09 DIAGNOSIS — N184 Chronic kidney disease, stage 4 (severe): Secondary | ICD-10-CM | POA: Diagnosis not present

## 2024-07-09 DIAGNOSIS — Z4781 Encounter for orthopedic aftercare following surgical amputation: Secondary | ICD-10-CM | POA: Diagnosis not present

## 2024-07-09 DIAGNOSIS — I130 Hypertensive heart and chronic kidney disease with heart failure and stage 1 through stage 4 chronic kidney disease, or unspecified chronic kidney disease: Secondary | ICD-10-CM | POA: Diagnosis not present

## 2024-07-09 DIAGNOSIS — Z7982 Long term (current) use of aspirin: Secondary | ICD-10-CM | POA: Diagnosis not present

## 2024-07-09 DIAGNOSIS — Z7985 Long-term (current) use of injectable non-insulin antidiabetic drugs: Secondary | ICD-10-CM | POA: Diagnosis not present

## 2024-07-09 DIAGNOSIS — Z794 Long term (current) use of insulin: Secondary | ICD-10-CM | POA: Diagnosis not present

## 2024-07-09 DIAGNOSIS — E1140 Type 2 diabetes mellitus with diabetic neuropathy, unspecified: Secondary | ICD-10-CM | POA: Diagnosis not present

## 2024-07-09 DIAGNOSIS — Z5982 Transportation insecurity: Secondary | ICD-10-CM | POA: Diagnosis not present

## 2024-07-09 DIAGNOSIS — Z89512 Acquired absence of left leg below knee: Secondary | ICD-10-CM | POA: Diagnosis not present

## 2024-07-09 DIAGNOSIS — I5030 Unspecified diastolic (congestive) heart failure: Secondary | ICD-10-CM | POA: Diagnosis not present

## 2024-07-09 DIAGNOSIS — D631 Anemia in chronic kidney disease: Secondary | ICD-10-CM | POA: Diagnosis not present

## 2024-07-09 DIAGNOSIS — E872 Acidosis, unspecified: Secondary | ICD-10-CM | POA: Diagnosis not present

## 2024-07-09 DIAGNOSIS — I2510 Atherosclerotic heart disease of native coronary artery without angina pectoris: Secondary | ICD-10-CM | POA: Diagnosis not present

## 2024-07-09 DIAGNOSIS — E1122 Type 2 diabetes mellitus with diabetic chronic kidney disease: Secondary | ICD-10-CM | POA: Diagnosis not present

## 2024-07-09 DIAGNOSIS — Z7902 Long term (current) use of antithrombotics/antiplatelets: Secondary | ICD-10-CM | POA: Diagnosis not present

## 2024-07-11 DIAGNOSIS — Z5982 Transportation insecurity: Secondary | ICD-10-CM | POA: Diagnosis not present

## 2024-07-11 DIAGNOSIS — Z794 Long term (current) use of insulin: Secondary | ICD-10-CM | POA: Diagnosis not present

## 2024-07-11 DIAGNOSIS — I2510 Atherosclerotic heart disease of native coronary artery without angina pectoris: Secondary | ICD-10-CM | POA: Diagnosis not present

## 2024-07-11 DIAGNOSIS — D631 Anemia in chronic kidney disease: Secondary | ICD-10-CM | POA: Diagnosis not present

## 2024-07-11 DIAGNOSIS — N184 Chronic kidney disease, stage 4 (severe): Secondary | ICD-10-CM | POA: Diagnosis not present

## 2024-07-11 DIAGNOSIS — E1140 Type 2 diabetes mellitus with diabetic neuropathy, unspecified: Secondary | ICD-10-CM | POA: Diagnosis not present

## 2024-07-11 DIAGNOSIS — Z4781 Encounter for orthopedic aftercare following surgical amputation: Secondary | ICD-10-CM | POA: Diagnosis not present

## 2024-07-11 DIAGNOSIS — Z7985 Long-term (current) use of injectable non-insulin antidiabetic drugs: Secondary | ICD-10-CM | POA: Diagnosis not present

## 2024-07-11 DIAGNOSIS — Z7982 Long term (current) use of aspirin: Secondary | ICD-10-CM | POA: Diagnosis not present

## 2024-07-11 DIAGNOSIS — Z7902 Long term (current) use of antithrombotics/antiplatelets: Secondary | ICD-10-CM | POA: Diagnosis not present

## 2024-07-11 DIAGNOSIS — Z89512 Acquired absence of left leg below knee: Secondary | ICD-10-CM | POA: Diagnosis not present

## 2024-07-11 DIAGNOSIS — I5030 Unspecified diastolic (congestive) heart failure: Secondary | ICD-10-CM | POA: Diagnosis not present

## 2024-07-11 DIAGNOSIS — E1122 Type 2 diabetes mellitus with diabetic chronic kidney disease: Secondary | ICD-10-CM | POA: Diagnosis not present

## 2024-07-11 DIAGNOSIS — I130 Hypertensive heart and chronic kidney disease with heart failure and stage 1 through stage 4 chronic kidney disease, or unspecified chronic kidney disease: Secondary | ICD-10-CM | POA: Diagnosis not present

## 2024-07-11 DIAGNOSIS — E872 Acidosis, unspecified: Secondary | ICD-10-CM | POA: Diagnosis not present

## 2024-07-13 ENCOUNTER — Telehealth: Payer: Self-pay

## 2024-07-13 DIAGNOSIS — E119 Type 2 diabetes mellitus without complications: Secondary | ICD-10-CM

## 2024-07-16 ENCOUNTER — Ambulatory Visit: Admitting: Internal Medicine

## 2024-07-17 ENCOUNTER — Ambulatory Visit: Attending: Vascular Surgery | Admitting: Physician Assistant

## 2024-07-17 ENCOUNTER — Other Ambulatory Visit: Payer: Self-pay

## 2024-07-17 VITALS — BP 109/71 | HR 87 | Temp 98.0°F | Wt 234.0 lb

## 2024-07-17 DIAGNOSIS — T8781 Dehiscence of amputation stump: Secondary | ICD-10-CM

## 2024-07-17 DIAGNOSIS — I7025 Atherosclerosis of native arteries of other extremities with ulceration: Secondary | ICD-10-CM

## 2024-07-17 DIAGNOSIS — Z89512 Acquired absence of left leg below knee: Secondary | ICD-10-CM

## 2024-07-17 NOTE — Progress Notes (Signed)
 POST OPERATIVE OFFICE NOTE    CC:  F/u for surgery  HPI:  This is a 68 y.o. male who is s/p Left below knee amputation on 05/31/24 by Dr. Magda.  He had a diabetic food infection with no revascularization options.   Pt returns today for follow up staple removal. He was seen on 06/26/24 and at that time he had some maceration along incision line. Staples were left in place and he was started on Doxycycline  with concern of some infection starting. He says he completed the course of Doxycycline . He has been at home taking care of his incision on his own mostly. He does have HH that has been coming out sporadically. He says he was given mixed instructions between us  an [facility] RN. He reports some drainage from wound. Some phantom pains but manageable overall. He denies any fever or chills.   Allergies  Allergen Reactions   Amlodipine  Besylate     Other Reaction(s): leg cramps   Chlorthalidone Other (See Comments)    Causes dehydration, kidneys shut down  Other Reaction(s): Kidney Failure    Current Outpatient Medications  Medication Sig Dispense Refill   acetaminophen  (TYLENOL ) 325 MG tablet Take 2 tablets (650 mg total) by mouth every 6 (six) hours as needed for mild pain (or Fever >/= 101).     acetaminophen  (TYLENOL ) 325 MG tablet Take 2 tablets (650 mg total) by mouth every 6 (six) hours as needed for pain and for fever greater than or equal to 101 40 tablet 0   allopurinol  (ZYLOPRIM ) 100 MG tablet Take 0.5 tablets (50 mg total) by mouth daily.     aspirin  EC 81 MG tablet Take 1 tablet (81 mg total) by mouth daily. (Patient taking differently: Take 81 mg by mouth in the morning.) 30 tablet 0   Blood Glucose Monitoring Suppl (BLOOD GLUCOSE MONITOR SYSTEM) w/Device KIT Use in the morning, at noon, and at bedtime. 1 kit 0   Continuous Glucose Receiver (FREESTYLE LIBRE 3 READER) DEVI Use as advised 1 each 0   Continuous Glucose Sensor (FREESTYLE LIBRE 3 PLUS SENSOR) MISC 1 each by Does not apply  route every 14 (fourteen) days. 6 each 3   cyclobenzaprine  (FLEXERIL ) 10 MG tablet Take 1 tablet (10 mg total) by mouth 3 (three) times daily as needed. 15 tablet 0   cyclobenzaprine  (FLEXERIL ) 10 MG tablet Take 1 tablet (10 mg total) by mouth every 8 (eight) hours as needed for muscle spasms. 15 tablet 0   DULoxetine  (CYMBALTA ) 20 MG capsule Take 1 capsule (20 mg total) by mouth 2 (two) times daily. 200 capsule 1   famotidine  (PEPCID ) 20 MG tablet Take 1 tablet (20 mg total) by mouth at bedtime. 30 tablet 0   furosemide  (LASIX ) 40 MG tablet Take 1 tablet (40 mg total) by mouth 2 (two) times daily. 200 tablet 0   gabapentin  (NEURONTIN ) 100 MG capsule Take 1 capsule (100 mg total) by mouth at bedtime. 100 capsule 1   insulin  glargine-yfgn (SEMGLEE ) 100 UNIT/ML injection Inject 0.08 mLs (8 Units total) into the skin daily.     insulin  lispro (HUMALOG  KWIKPEN) 100 UNIT/ML KwikPen Inject 5-14 Units into the skin 3 (three) times daily with meals. Inject 5 units with meals, add the following per glucose level. CBG 70 - 120: 0 units CBG 121 - 150: 1 unit CBG 151 - 200: 2 units CBG 201 - 250: 3 units CBG 251 - 300: 5 units CBG 301 - 350: 7 units CBG  351 - 400: 9 units MDD: 50 units 30 mL 1   Insulin  Pen Needle 32G X 4 MM MISC Use with Lantus  100 each 0   isosorbide  mononitrate (IMDUR ) 60 MG 24 hr tablet Take 2 tablets by mouth in the morning AND 1 tablet every evening. (Patient taking differently: Take 2 tablets by mouth in the morning AND then take 1 tablet by mouth every evening.) 270 tablet 3   latanoprost  (XALATAN ) 0.005 % ophthalmic solution Place 1 drop into both eyes at bedtime. 7.5 mL 1   metoprolol  succinate (TOPROL -XL) 100 MG 24 hr tablet Take 1 tablet (100 mg total) by mouth daily. (Patient taking differently: Take 100 mg by mouth in the morning.) 90 tablet 1   nitroGLYCERIN  (NITROSTAT ) 0.4 MG SL tablet Place 1 tablet (0.4 mg total) under the tongue every 5 (five) minutes as needed for chest pain. 25  tablet 3   oxyCODONE  (OXY IR/ROXICODONE ) 5 MG immediate release tablet Take 1 tablet (5 mg total) by mouth every 8 (eight) hours as needed for up to 5 days for severe pain (pain score 7-10). 15 tablet 0   pantoprazole  (PROTONIX ) 40 MG tablet Take 1 tablet (40 mg total) by mouth 2 (two) times daily. 60 tablet 0   rosuvastatin  (CRESTOR ) 10 MG tablet Take 1 tablet (10 mg total) by mouth daily.     Semaglutide , 1 MG/DOSE, (OZEMPIC , 1 MG/DOSE,) 4 MG/3ML SOPN Inject 1 mg into the skin once a week. (Patient taking differently: Inject 1 mg into the skin once a week. On Thursday) 9 mL 3   senna-docusate (SENOKOT-S) 8.6-50 MG tablet Take 1-2 tablets by mouth 2 (two) times daily between meals as needed for mild constipation or moderate constipation.     SILVER  SULFADIAZINE  EX Apply 1 Application topically 3 (three) times a week. No set days for the patient.     tamsulosin  (FLOMAX ) 0.4 MG CAPS capsule Take 1 capsule (0.4 mg total) by mouth daily after supper.     ticagrelor  (BRILINTA ) 90 MG TABS tablet Take 1 tablet (90 mg total) by mouth 2 (two) times daily. 180 tablet 3   No current facility-administered medications for this visit.     ROS:  See HPI  Physical Exam:  Vitals:   07/17/24 0914  BP: 109/71  Pulse: 87  Temp: 98 F (36.7 C)    Incision:   Left BKA dehisced as shown below. Staples removed as they are essentially not intact. SS drainage. Fibrinous tissue in wound bed.    Assessment/Plan:  This is a 68 y.o. male who is s/p: Left below knee amputation on 05/31/24 by Dr. Magda.  He had a diabetic food infection with no revascularization options.  Left BKA incision now dehisced with unhealthy tissue in wound bed and along incision. Some of this could be debrided at bedside but the tissue is not healthy along incision and feel he would be better served by surgical debridement and revision in OR. - Advised him in the mean time to wash with soap and water  pat dry. Apply wet to dry dressings  daily.  - He is on Brillinta which will need held for surgery - Will arrange Revision/ I& D of left BKA in the near future with Dr. Magda Curry Damme, [facility] [phone]   Clinic MD:  Magda

## 2024-07-18 ENCOUNTER — Ambulatory Visit: Admitting: Internal Medicine
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
## 2024-07-20 ENCOUNTER — Encounter (HOSPITAL_COMMUNITY): Payer: Self-pay | Admitting: Vascular Surgery

## 2024-07-20 ENCOUNTER — Other Ambulatory Visit: Payer: Self-pay

## 2024-07-20 DIAGNOSIS — Z7902 Long term (current) use of antithrombotics/antiplatelets: Secondary | ICD-10-CM | POA: Diagnosis not present

## 2024-07-20 DIAGNOSIS — E1140 Type 2 diabetes mellitus with diabetic neuropathy, unspecified: Secondary | ICD-10-CM | POA: Diagnosis not present

## 2024-07-20 DIAGNOSIS — E872 Acidosis, unspecified: Secondary | ICD-10-CM | POA: Diagnosis not present

## 2024-07-20 DIAGNOSIS — I130 Hypertensive heart and chronic kidney disease with heart failure and stage 1 through stage 4 chronic kidney disease, or unspecified chronic kidney disease: Secondary | ICD-10-CM | POA: Diagnosis not present

## 2024-07-20 DIAGNOSIS — Z89512 Acquired absence of left leg below knee: Secondary | ICD-10-CM | POA: Diagnosis not present

## 2024-07-20 DIAGNOSIS — D631 Anemia in chronic kidney disease: Secondary | ICD-10-CM | POA: Diagnosis not present

## 2024-07-20 DIAGNOSIS — Z4781 Encounter for orthopedic aftercare following surgical amputation: Secondary | ICD-10-CM | POA: Diagnosis not present

## 2024-07-20 DIAGNOSIS — E1122 Type 2 diabetes mellitus with diabetic chronic kidney disease: Secondary | ICD-10-CM | POA: Diagnosis not present

## 2024-07-20 DIAGNOSIS — Z7985 Long-term (current) use of injectable non-insulin antidiabetic drugs: Secondary | ICD-10-CM | POA: Diagnosis not present

## 2024-07-20 DIAGNOSIS — Z5982 Transportation insecurity: Secondary | ICD-10-CM | POA: Diagnosis not present

## 2024-07-20 DIAGNOSIS — Z7982 Long term (current) use of aspirin: Secondary | ICD-10-CM | POA: Diagnosis not present

## 2024-07-20 DIAGNOSIS — I2510 Atherosclerotic heart disease of native coronary artery without angina pectoris: Secondary | ICD-10-CM | POA: Diagnosis not present

## 2024-07-20 DIAGNOSIS — I5030 Unspecified diastolic (congestive) heart failure: Secondary | ICD-10-CM | POA: Diagnosis not present

## 2024-07-20 DIAGNOSIS — N184 Chronic kidney disease, stage 4 (severe): Secondary | ICD-10-CM | POA: Diagnosis not present

## 2024-07-20 DIAGNOSIS — Z794 Long term (current) use of insulin: Secondary | ICD-10-CM | POA: Diagnosis not present

## 2024-07-20 NOTE — Progress Notes (Signed)
 Anesthesia Chart Review: Same day workup  68 year old male follows with cardiology for history of CAD s/p PCI with DES to his LAD and RCA in 2012.  He underwent subsequent PCI with DES to LAD and circumflex in 2017 and PCI to LAD for ISR 4/20, PCI with DES to, PCI with DES to P LAD 9/20, combined chronic CHF, HTN, HLD, OSA.  Cardiac PET/CT TTE 05/08/2024 showed ischemia in the apical to mid anterior lateral wall and inferior wall with rest EF decreased to 35% and stress EF 37% (down from EF of 55 to 60% on echo 11/2022).  He was seen in follow-up by Josefa Beauvais, NP on 05/23/2024 to discuss the results.  Per note, denies anginal type symptoms.  Is somewhat physically active and denies exertional chest pain.  He does note 3 episodes of chest discomfort that were nonexertional in the last 3 months.  During these episodes he took 1 sublingual nitroglycerin .  Chest discomfort resolves with rest.  Cardiac PET/CT showed abnormal left ventricular perfusion, evidence of ischemia, large defect with moderate reduction in uptake in apical-mid anterior lateral and inferior locations that is reversible.  His EF was noted to be 35% and stress EF was 37%.  Findings are consistent with ischemia as study was considered high risk.  He does have significant renal dysfunction and follows with nephrology.  He also has a left foot infection that is being followed by podiatry.  He thinks that he may need surgery in the form of I&D in the near future for this. Continue  metoprolol , aspirin , Brilinta , Imdur . I will reach out to Dr. Burnard for recommendations on proceeding with cardiac catheterization or postponing at this time due to his upcoming surgery and renal function.  Other pertinent history includes IDDM2 (A1c 7.0 on 03/14/2024), pulmonary sarcoidosis, GERD, CKD 4.  Patient was subsequently admitted 6/13 through 06/06/2024 for worsening left foot ulcer with osteomyelitis.  He also had AKI on admission with creatinine up to 5.71,  improved to 3.03 at discharge.  He underwent left BKA on 05/31/2024.  He was last seen in outpatient follow-up by vascular surgery on 07/17/2024 and noted to have dehiscence of left BKA incision.  Recommended surgical debridement and revision in the OR.  Patient reports last dose Plavix  07/17/2024.  BMP and CBC from 07/06/2024 reviewed, glucose elevated 191, creatinine elevated but stable at 2.95, mild anemia with hemoglobin 11.3, otherwise unremarkable.  He will need day of surgery evaluation.  EKG 05/25/2024: Sinus rhythm.  Rate 85. Borderline prolonged PR interval. Borderline T wave abnormalities  Cardiac PET/CT 05/08/24:   LV perfusion is abnormal. There is evidence of ischemia. There is no evidence of infarction. Defect 1: There is a large defect with moderate reduction in uptake present in the apical to mid anterolateral and inferior location(s) that is reversible. There is normal wall motion in the defect area. Consistent with ischemia.   Rest left ventricular function is abnormal. Rest global function is moderately reduced. There were no regional wall motion abnormalities. Rest EF: 35%. Stress left ventricular function is abnormal. Stress global function is moderately reduced. There were no regional wall abnormalities. Stress EF: 37%. End diastolic cavity size is mildly enlarged. End systolic cavity size is mildly enlarged.   Myocardial blood flow was computed to be 0.60ml/g/min at rest and 1.01ml/g/min at stress. Global myocardial blood flow reserve was 1.68 and was abnormal.   Coronary calcium  assessment not performed due to prior revascularization. Multiple prior stents   Findings are consistent with  ischemia. The study is high risk. based on TID and decreased EF   Ischemia noted in inferior and anterolateral regions with abnormal MBFR, TID and decreased EF suggests multivessel obstructive CAD     Joshua [name] [facility] Short Stay Center/Anesthesiology Phone [phone] 07/20/2024 4:34  PM

## 2024-07-20 NOTE — Progress Notes (Addendum)
 PCP - Worth SHAUNNA Moloney, PA Cardiologist - Josefa Beauvais, NP  Chest x-ray - 07/10/23 EKG - 05/28/24 Stress Test - 05/08/24 ECHO - 12/09/22 Cardiac Cath - 08/22/19  CPAP - Does not wear  Fasting Blood Sugar - 130-140 Checks Blood Sugar: Wears CGM. On left arm currently  Ozempic : Last dose was on 07/17/24  Blood Thinner Instructions: Hold Brilinta  5 days prior to the procedure. Last dose 07/17/24 Aspirin  Instructions: Con't. Pt hasn't taken ASA in months  Pt currently has a runny nose but discharge is clear and has no other symptoms. Told pt to call over the weekend if he develops more symptoms.   Anesthesia review: Y  Patient verbally denies any shortness of breath, fever, cough and chest pain during phone call   -------------  SDW INSTRUCTIONS given:  Your procedure is scheduled on Monday, Aug 11th.  Report to [facility] Main Entrance A at 0830 A.M., and check in at the Admitting office.  Call this number if you have problems the morning of surgery:  [phone]   Remember:  Do not eat or drink after midnight the night before your surgery.    Take these medicines the morning of surgery with A SIP OF WATER   allopurinol  (ZYLOPRIM )  Aspirin  DULoxetine  (CYMBALTA )  isosorbide  mononitrate (IMDUR )  metoprolol  succinate  pantoprazole  (PROTONIX )  acetaminophen  (TYLENOL )-if needed  **insulin  glargine (LANTUS  SOLOSTAR)** Day before surgery: AM normal dose. Lunch and dinner dose take 1/2 of normal dose.  Day of surgery: Take 50% of normal dose  **insulin  lispro (HUMALOG  KWIKPEN)** Day before surgery: Before meals take normal dose. Do not take bedtime dose.  Day of surgery: ONLY take HALF of normal dose if blood sugar is above 220.  ** PLEASE check your blood sugar the morning of your surgery when you wake up and every 2 hours until you get to the Short Stay unit.  If your blood sugar is less than 70 mg/dL, you will need to treat for low blood sugar: Do not take  insulin . Treat a low blood sugar (less than 70 mg/dL) with  cup of clear juice (cranberry or apple), 4 glucose tablets, OR glucose gel. Recheck blood sugar in 15 minutes after treatment (to make sure it is greater than 70 mg/dL). If your blood sugar is not greater than 70 mg/dL on recheck, call [phone] for further instructions.  As of today, STOP taking any Aspirin  (unless otherwise instructed by your surgeon) Aleve, Naproxen, Ibuprofen, Motrin, Advil, Goody's, BC's, all herbal medications, fish oil, and all vitamins.                      Do not wear jewelry, make up, or nail polish            Do not wear lotions, powders, perfumes/colognes, or deodorant.            Do not shave 48 hours prior to surgery.  Men may shave face and neck.            Do not bring valuables to the hospital.            [facility] is not responsible for any belongings or valuables.  Do NOT Smoke (Tobacco/Vaping) 24 hours prior to your procedure If you use a CPAP at night, you may bring all equipment for your overnight stay.   Contacts, glasses, dentures or bridgework may not be worn into surgery.      For patients admitted to the hospital, discharge time  will be determined by your treatment team.   Patients discharged the day of surgery will not be allowed to drive home, and someone needs to stay with them for 24 hours.    Special instructions:   [facility]- Preparing For Surgery  Before surgery, you can play an important role. Because skin is not sterile, your skin needs to be as free of germs as possible. You can reduce the number of germs on your skin by washing with CHG (chlorahexidine gluconate) Soap before surgery.  CHG is an antiseptic cleaner which kills germs and bonds with the skin to continue killing germs even after washing.    Oral Hygiene is also important to reduce your risk of infection.  Remember - BRUSH YOUR TEETH THE MORNING OF SURGERY WITH YOUR REGULAR TOOTHPASTE  Please do not use  if you have an allergy to CHG or antibacterial soaps. If your skin becomes reddened/irritated stop using the CHG.  Do not shave (including legs and underarms) for at least 48 hours prior to first CHG shower. It is OK to shave your face.  Please follow these instructions carefully.   Shower the NIGHT BEFORE SURGERY and the MORNING OF SURGERY with DIAL  Soap.   Pat yourself dry with a CLEAN TOWEL.  Wear CLEAN PAJAMAS to bed the night before surgery  Place CLEAN SHEETS on your bed the night of your first shower and DO NOT SLEEP WITH PETS.   Day of Surgery: Please shower morning of surgery  Wear Clean/Comfortable clothing the morning of surgery Do not apply any deodorants/lotions.   Remember to brush your teeth WITH YOUR REGULAR TOOTHPASTE.   Questions were answered. Patient verbalized understanding of instructions.

## 2024-07-20 NOTE — Anesthesia Preprocedure Evaluation (Addendum)
 Anesthesia Evaluation  Patient identified by MRN, date of birth, ID band Patient awake    Reviewed: Allergy & Precautions, H&P , NPO status , Patient's Chart, lab work & pertinent test results, reviewed documented beta blocker date and time   History of Anesthesia Complications Negative for: history of anesthetic complications  Airway Mallampati: I  TM Distance: >3 FB Neck ROM: Full    Dental  (+) Chipped,    Pulmonary neg shortness of breath, sleep apnea and Continuous Positive Airway Pressure Ventilation , COPD  Sarcoidosis    breath sounds clear to auscultation       Cardiovascular hypertension, Pt. on home beta blockers and Pt. on medications (-) angina + CAD, + Past MI, + Cardiac Stents, + Peripheral Vascular Disease and +CHF  + Valvular Problems/Murmurs  Rhythm:Regular  23:  1. Left ventricular ejection fraction, by estimation, is 55 to 60%. The  left ventricle has normal function. The left ventricle has no regional  wall motion abnormalities. Left ventricular diastolic parameters were  normal.   2. Right ventricular systolic function is normal. The right ventricular  size is normal.   3. The mitral valve is normal in structure. No evidence of mitral valve  regurgitation. No evidence of mitral stenosis.   4. The aortic valve is normal in structure. Aortic valve regurgitation is  not visualized. No aortic stenosis is present.   5. The inferior vena cava is dilated in size with >50% respiratory  variability, suggesting right atrial pressure of 8 mmHg.      2020:   Prox LAD lesion is 95% stenosed.  A drug-eluting stent was successfully placed using a STENT SYNERGY DES 3.5X20.  Post intervention, there is a 0% residual stenosis.  Ost 1st Diag to 1st Diag lesion is 40% stenosed.  Dist LAD lesion is 10% stenosed.  Non-stenotic 2nd Mrg lesion was previously treated.  3rd Mrg lesion is 30% stenosed.  Mid Cx  lesion is 50% stenosed.  RPDA lesion is 30% stenosed.  Prox RCA to Mid RCA lesion is 45% stenosed.  Mid RCA lesion is 25% stenosed.  LV end diastolic pressure is normal.  Hemodynamic findings consistent with mild pulmonary hypertension.   1. Single vessel obstructive CAD with restenosis in the proximal LAD stent.  2. Successful PCI of the proximal LAD with DES x 1 3. Mild pulmonary HTN 4. Normal LV filling pressures 5. Procedure with 50 cc of contrast used   Plan: DAPT indefinitely. Anticipate DC home in am.    Neuro/Psych neg Seizures PSYCHIATRIC DISORDERS  Depression     Neuromuscular disease    GI/Hepatic Neg liver ROS, PUD,GERD  Medicated and Controlled,,  Endo/Other  diabetes, Type 2, Insulin  Dependent, Oral Hypoglycemic Agents  Class 3 obesity  Renal/GU Renal InsufficiencyRenal diseaseLab Results      Component                Value               Date                      NA                       139                 07/06/2024                K  4.4                 07/06/2024                CO2                      21 (L)              07/06/2024                GLUCOSE                  191 (H)             07/06/2024                BUN                      60 (H)              07/06/2024                CREATININE               2.95 (H)            07/06/2024                CALCIUM                   8.9                 07/06/2024                GFR                      65.05               08/07/2015                EGFR                     34 (L)              11/18/2022                GFRNONAA                 23 (L)              07/06/2024                Musculoskeletal  (+) Arthritis ,   Gout   Abdominal  (+) + obese  Peds negative pediatric ROS (+)  Hematology  (+) Blood dyscrasia, anemia Lab Results      Component                Value               Date                      WBC                      8.8                 07/06/2024                 HGB  11.3 (L)            07/06/2024                HCT                      36.8 (L)            07/06/2024                MCV                      92.5                07/06/2024                PLT                      214                 07/06/2024              Anesthesia Other Findings Sarcoidosis  Reproductive/Obstetrics                              Anesthesia Physical Anesthesia Plan  ASA: 3  Anesthesia Plan: General   Post-op Pain Management: Regional block* and Gabapentin  PO (pre-op)*   Induction: Intravenous  PONV Risk Score and Plan: 2 and Ondansetron , Midazolam  and Treatment may vary due to age or medical condition  Airway Management Planned: Oral ETT  Additional Equipment: None  Intra-op Plan:   Post-operative Plan: Extubation in OR  Informed Consent: I have reviewed the patients History and Physical, chart, labs and discussed the procedure including the risks, benefits and alternatives for the proposed anesthesia with the patient or authorized representative who has indicated his/her understanding and acceptance.     Dental advisory given  Plan Discussed with: CRNA and Anesthesiologist  Anesthesia Plan Comments: (PAT note by Lynwood Hope, PA-C: 68 year old male follows with cardiology for history of CAD s/p PCI with DES to his LAD and RCA in 2012.  He underwent subsequent PCI with DES to LAD and circumflex in 2017 and PCI to LAD for ISR 4/20, PCI with DES to, PCI with DES to P LAD 9/20, combined chronic CHF, HTN, HLD, OSA.  Cardiac PET/CT TTE 05/08/2024 showed ischemia in the apical to mid anterior lateral wall and inferior wall with rest EF decreased to 35% and stress EF 37% (down from EF of 55 to 60% on echo 11/2022).  He was seen in follow-up by Josefa Beauvais, NP on 05/23/2024 to discuss the results.  Per note, denies anginal type symptoms.  Is somewhat physically active and denies exertional chest pain.  He does note  3 episodes of chest discomfort that were nonexertional in the last 3 months.  During these episodes he took 1 sublingual nitroglycerin .  Chest discomfort resolves with rest.  Cardiac PET/CT showed abnormal left ventricular perfusion, evidence of ischemia, large defect with moderate reduction in uptake in apical-mid anterior lateral and inferior locations that is reversible.  His EF was noted to be 35% and stress EF was 37%.  Findings are consistent with ischemia as study was considered high risk.  He does have significant renal dysfunction and follows with nephrology.  He also has a left foot infection that is being followed by podiatry.  He thinks that he may need surgery in  the form of I&D in the near future for this. Continue  metoprolol , aspirin , Brilinta , Imdur . I will reach out to Dr. Burnard for recommendations on proceeding with cardiac catheterization or postponing at this time due to his upcoming surgery and renal function.  Other pertinent history includes IDDM2 (A1c 7.0 on 03/14/2024), pulmonary sarcoidosis, GERD, CKD 4.  Patient was subsequently admitted 6/13 through 06/06/2024 for worsening left foot ulcer with osteomyelitis.  He also had AKI on admission with creatinine up to 5.71, improved to 3.03 at discharge.  He underwent left BKA on 05/31/2024.  He was last seen in outpatient follow-up by vascular surgery on 07/17/2024 and noted to have dehiscence of left BKA incision.  Recommended surgical debridement and revision in the OR.  Patient reports last dose Plavix  07/17/2024.  BMP and CBC from 07/06/2024 reviewed, glucose elevated 191, creatinine elevated but stable at 2.95, mild anemia with hemoglobin 11.3, otherwise unremarkable.  He will need day of surgery evaluation.  EKG 05/25/2024: Sinus rhythm.  Rate 85. Borderline prolonged PR interval. Borderline T wave abnormalities  Cardiac PET/CT 05/08/24:   LV perfusion is abnormal. There is evidence of ischemia. There is no evidence of infarction.  Defect 1: There is a large defect with moderate reduction in uptake present in the apical to mid anterolateral and inferior location(s) that is reversible. There is normal wall motion in the defect area. Consistent with ischemia.   Rest left ventricular function is abnormal. Rest global function is moderately reduced. There were no regional wall motion abnormalities. Rest EF: 35%. Stress left ventricular function is abnormal. Stress global function is moderately reduced. There were no regional wall abnormalities. Stress EF: 37%. End diastolic cavity size is mildly enlarged. End systolic cavity size is mildly enlarged.   Myocardial blood flow was computed to be 0.60ml/g/min at rest and 1.01ml/g/min at stress. Global myocardial blood flow reserve was 1.68 and was abnormal.   Coronary calcium  assessment not performed due to prior revascularization. Multiple prior stents   Findings are consistent with ischemia. The study is high risk. based on TID and decreased EF   Ischemia noted in inferior and anterolateral regions with abnormal MBFR, TID and decreased EF suggests multivessel obstructive CAD   )        Anesthesia Quick Evaluation

## 2024-07-23 ENCOUNTER — Inpatient Hospital Stay (HOSPITAL_COMMUNITY): Payer: Self-pay | Admitting: Physician Assistant

## 2024-07-23 ENCOUNTER — Encounter (HOSPITAL_COMMUNITY)
Admission: RE | Disposition: A | Discharge status: Home Health | Payer: Self-pay | Source: Home / Self Care | Attending: Internal Medicine

## 2024-07-23 ENCOUNTER — Inpatient Hospital Stay (HOSPITAL_COMMUNITY)
Admission: RE | Admit: 2024-07-23 | Discharge: 2024-07-24 | DRG: 464 | Disposition: A | Discharge status: Home Health | Attending: Internal Medicine | Admitting: Internal Medicine

## 2024-07-23 ENCOUNTER — Other Ambulatory Visit: Payer: Self-pay

## 2024-07-23 ENCOUNTER — Encounter (HOSPITAL_COMMUNITY): Payer: Self-pay | Admitting: Vascular Surgery

## 2024-07-23 DIAGNOSIS — I5042 Chronic combined systolic (congestive) and diastolic (congestive) heart failure: Secondary | ICD-10-CM | POA: Diagnosis present

## 2024-07-23 DIAGNOSIS — E118 Type 2 diabetes mellitus with unspecified complications: Secondary | ICD-10-CM | POA: Diagnosis not present

## 2024-07-23 DIAGNOSIS — G546 Phantom limb syndrome with pain: Secondary | ICD-10-CM | POA: Diagnosis present

## 2024-07-23 DIAGNOSIS — Z888 Allergy status to other drugs, medicaments and biological substances status: Secondary | ICD-10-CM

## 2024-07-23 DIAGNOSIS — Z955 Presence of coronary angioplasty implant and graft: Secondary | ICD-10-CM

## 2024-07-23 DIAGNOSIS — E782 Mixed hyperlipidemia: Secondary | ICD-10-CM

## 2024-07-23 DIAGNOSIS — I10 Essential (primary) hypertension: Secondary | ICD-10-CM | POA: Diagnosis present

## 2024-07-23 DIAGNOSIS — I110 Hypertensive heart disease with heart failure: Secondary | ICD-10-CM | POA: Diagnosis not present

## 2024-07-23 DIAGNOSIS — E785 Hyperlipidemia, unspecified: Secondary | ICD-10-CM | POA: Diagnosis present

## 2024-07-23 DIAGNOSIS — I255 Ischemic cardiomyopathy: Secondary | ICD-10-CM | POA: Diagnosis not present

## 2024-07-23 DIAGNOSIS — N184 Chronic kidney disease, stage 4 (severe): Secondary | ICD-10-CM | POA: Diagnosis not present

## 2024-07-23 DIAGNOSIS — K219 Gastro-esophageal reflux disease without esophagitis: Secondary | ICD-10-CM | POA: Diagnosis present

## 2024-07-23 DIAGNOSIS — Y835 Amputation of limb(s) as the cause of abnormal reaction of the patient, or of later complication, without mention of misadventure at the time of the procedure: Secondary | ICD-10-CM | POA: Diagnosis present

## 2024-07-23 DIAGNOSIS — F32A Depression, unspecified: Secondary | ICD-10-CM | POA: Diagnosis present

## 2024-07-23 DIAGNOSIS — I252 Old myocardial infarction: Secondary | ICD-10-CM

## 2024-07-23 DIAGNOSIS — E669 Obesity, unspecified: Secondary | ICD-10-CM | POA: Diagnosis present

## 2024-07-23 DIAGNOSIS — J449 Chronic obstructive pulmonary disease, unspecified: Secondary | ICD-10-CM | POA: Diagnosis present

## 2024-07-23 DIAGNOSIS — Z833 Family history of diabetes mellitus: Secondary | ICD-10-CM

## 2024-07-23 DIAGNOSIS — M109 Gout, unspecified: Secondary | ICD-10-CM | POA: Diagnosis present

## 2024-07-23 DIAGNOSIS — Z6834 Body mass index (BMI) 34.0-34.9, adult: Secondary | ICD-10-CM

## 2024-07-23 DIAGNOSIS — Z7982 Long term (current) use of aspirin: Secondary | ICD-10-CM

## 2024-07-23 DIAGNOSIS — Z9861 Coronary angioplasty status: Secondary | ICD-10-CM

## 2024-07-23 DIAGNOSIS — I2510 Atherosclerotic heart disease of native coronary artery without angina pectoris: Secondary | ICD-10-CM | POA: Diagnosis present

## 2024-07-23 DIAGNOSIS — D638 Anemia in other chronic diseases classified elsewhere: Secondary | ICD-10-CM | POA: Diagnosis not present

## 2024-07-23 DIAGNOSIS — T8781 Dehiscence of amputation stump: Principal | ICD-10-CM | POA: Diagnosis present

## 2024-07-23 DIAGNOSIS — I130 Hypertensive heart and chronic kidney disease with heart failure and stage 1 through stage 4 chronic kidney disease, or unspecified chronic kidney disease: Secondary | ICD-10-CM | POA: Diagnosis present

## 2024-07-23 DIAGNOSIS — Z8249 Family history of ischemic heart disease and other diseases of the circulatory system: Secondary | ICD-10-CM | POA: Diagnosis not present

## 2024-07-23 DIAGNOSIS — E1122 Type 2 diabetes mellitus with diabetic chronic kidney disease: Secondary | ICD-10-CM | POA: Diagnosis not present

## 2024-07-23 DIAGNOSIS — Z794 Long term (current) use of insulin: Secondary | ICD-10-CM

## 2024-07-23 DIAGNOSIS — K3184 Gastroparesis: Secondary | ICD-10-CM | POA: Diagnosis present

## 2024-07-23 DIAGNOSIS — D631 Anemia in chronic kidney disease: Secondary | ICD-10-CM | POA: Diagnosis not present

## 2024-07-23 DIAGNOSIS — T8789 Other complications of amputation stump: Secondary | ICD-10-CM | POA: Diagnosis not present

## 2024-07-23 DIAGNOSIS — Z79899 Other long term (current) drug therapy: Secondary | ICD-10-CM

## 2024-07-23 DIAGNOSIS — G8929 Other chronic pain: Secondary | ICD-10-CM | POA: Diagnosis present

## 2024-07-23 DIAGNOSIS — Z7902 Long term (current) use of antithrombotics/antiplatelets: Secondary | ICD-10-CM | POA: Diagnosis not present

## 2024-07-23 DIAGNOSIS — E538 Deficiency of other specified B group vitamins: Secondary | ICD-10-CM | POA: Diagnosis present

## 2024-07-23 DIAGNOSIS — Z8739 Personal history of other diseases of the musculoskeletal system and connective tissue: Secondary | ICD-10-CM | POA: Diagnosis not present

## 2024-07-23 DIAGNOSIS — M545 Low back pain, unspecified: Secondary | ICD-10-CM | POA: Diagnosis present

## 2024-07-23 DIAGNOSIS — Z7985 Long-term (current) use of injectable non-insulin antidiabetic drugs: Secondary | ICD-10-CM | POA: Diagnosis not present

## 2024-07-23 DIAGNOSIS — T8754 Necrosis of amputation stump, left lower extremity: Secondary | ICD-10-CM

## 2024-07-23 DIAGNOSIS — Z89512 Acquired absence of left leg below knee: Secondary | ICD-10-CM

## 2024-07-23 DIAGNOSIS — E1143 Type 2 diabetes mellitus with diabetic autonomic (poly)neuropathy: Secondary | ICD-10-CM | POA: Diagnosis present

## 2024-07-23 DIAGNOSIS — E1142 Type 2 diabetes mellitus with diabetic polyneuropathy: Secondary | ICD-10-CM | POA: Diagnosis present

## 2024-07-23 DIAGNOSIS — I5030 Unspecified diastolic (congestive) heart failure: Secondary | ICD-10-CM

## 2024-07-23 DIAGNOSIS — G4733 Obstructive sleep apnea (adult) (pediatric): Secondary | ICD-10-CM | POA: Diagnosis present

## 2024-07-23 DIAGNOSIS — T8131XA Disruption of external operation (surgical) wound, not elsewhere classified, initial encounter: Principal | ICD-10-CM | POA: Diagnosis present

## 2024-07-23 LAB — COMPREHENSIVE METABOLIC PANEL WITH GFR
ALT: 22 U/L (ref 0–44)
AST: 23 U/L (ref 15–41)
Albumin: 2.9 g/dL — ABNORMAL LOW (ref 3.5–5.0)
Alkaline Phosphatase: 125 U/L (ref 38–126)
Anion gap: 11 (ref 5–15)
BUN: 44 mg/dL — ABNORMAL HIGH (ref 8–23)
CO2: 18 mmol/L — ABNORMAL LOW (ref 22–32)
Calcium: 8.6 mg/dL — ABNORMAL LOW (ref 8.9–10.3)
Chloride: 110 mmol/L (ref 98–111)
Creatinine, Ser: 2.51 mg/dL — ABNORMAL HIGH (ref 0.61–1.24)
GFR, Estimated: 28 mL/min — ABNORMAL LOW (ref >60)
Glucose, Bld: 227 mg/dL — ABNORMAL HIGH (ref 70–99)
Potassium: 3.9 mmol/L (ref 3.5–5.1)
Sodium: 139 mmol/L (ref 135–145)
Total Bilirubin: 0.2 mg/dL (ref 0.0–1.2)
Total Protein: 6.7 g/dL (ref 6.5–8.1)

## 2024-07-23 LAB — CBC WITH DIFFERENTIAL/PLATELET
Abs Immature Granulocytes: 0.07 K/uL (ref 0.00–0.07)
Basophils Absolute: 0 K/uL (ref 0.0–0.1)
Basophils Relative: 0 %
Eosinophils Absolute: 0 K/uL (ref 0.0–0.5)
Eosinophils Relative: 0 %
HCT: 33.8 % — ABNORMAL LOW (ref 39.0–52.0)
Hemoglobin: 10.6 g/dL — ABNORMAL LOW (ref 13.0–17.0)
Immature Granulocytes: 1 %
Lymphocytes Relative: 18 %
Lymphs Abs: 1.6 K/uL (ref 0.7–4.0)
MCH: 29 pg (ref 26.0–34.0)
MCHC: 31.4 g/dL (ref 30.0–36.0)
MCV: 92.6 fL (ref 80.0–100.0)
Monocytes Absolute: 0.8 K/uL (ref 0.1–1.0)
Monocytes Relative: 9 %
Neutro Abs: 6.3 K/uL (ref 1.7–7.7)
Neutrophils Relative %: 72 %
Platelets: 179 K/uL (ref 150–400)
RBC: 3.65 MIL/uL — ABNORMAL LOW (ref 4.22–5.81)
RDW: 15.5 % (ref 11.5–15.5)
WBC: 8.7 K/uL (ref 4.0–10.5)
nRBC: 0 % (ref 0.0–0.2)

## 2024-07-23 LAB — GLUCOSE, CAPILLARY
Glucose-Capillary: 201 mg/dL — ABNORMAL HIGH (ref 70–99)
Glucose-Capillary: 206 mg/dL — ABNORMAL HIGH (ref 70–99)
Glucose-Capillary: 207 mg/dL — ABNORMAL HIGH (ref 70–99)
Glucose-Capillary: 275 mg/dL — ABNORMAL HIGH (ref 70–99)

## 2024-07-23 SURGERY — REVISION AMPUTATION, BELOW THE KNEE
Anesthesia: General | Site: Leg Lower | Laterality: Left

## 2024-07-23 MED ORDER — FENTANYL CITRATE (PF) 250 MCG/5ML IJ SOLN
INTRAMUSCULAR | Status: DC | PRN
  Administered 2024-07-23: 100 ug via INTRAVENOUS
  Administered 2024-07-23 (×2): 50 ug via INTRAVENOUS
  Administered 2024-07-23: 100 ug via INTRAVENOUS

## 2024-07-23 MED ORDER — HEMOSTATIC AGENTS (NO CHARGE) OPTIME
TOPICAL | Status: DC | PRN
Start: 2024-07-23 — End: 2024-07-23
  Administered 2024-07-23 (×2): 1 via TOPICAL

## 2024-07-23 MED ORDER — ISOSORBIDE MONONITRATE ER 60 MG PO TB24
60.0000 mg | ORAL_TABLET | Freq: Every evening | ORAL | Status: DC
  Administered 2024-07-23 (×2): 60 mg via ORAL
  Filled 2024-07-23 (×2): qty 1

## 2024-07-23 MED ORDER — CHLORHEXIDINE GLUCONATE CLOTH 2 % EX PADS: 6.0000 | MEDICATED_PAD | Freq: Once | CUTANEOUS | Status: DC

## 2024-07-23 MED ORDER — FAMOTIDINE 20 MG PO TABS
20.0000 mg | ORAL_TABLET | Freq: Every day | ORAL | Status: DC
  Administered 2024-07-23 (×2): 20 mg via ORAL
  Filled 2024-07-23: qty 1

## 2024-07-23 MED ORDER — MIDAZOLAM HCL 2 MG/2ML IJ SOLN
INTRAMUSCULAR | Status: AC
  Filled 2024-07-23: qty 2

## 2024-07-23 MED ORDER — LIDOCAINE 2% (20 MG/ML) 5 ML SYRINGE: INTRAMUSCULAR | Status: DC | PRN

## 2024-07-23 MED ORDER — CEFAZOLIN SODIUM-DEXTROSE 2-4 GM/100ML-% IV SOLN
2.0000 g | INTRAVENOUS | Status: AC
  Administered 2024-07-23 (×2): 2 g via INTRAVENOUS
  Filled 2024-07-23: qty 100

## 2024-07-23 MED ORDER — PROPOFOL 500 MG/50ML IV EMUL
INTRAVENOUS | Status: DC | PRN
  Administered 2024-07-23 (×2): 100 ug/kg/min via INTRAVENOUS

## 2024-07-23 MED ORDER — SENNOSIDES-DOCUSATE SODIUM 8.6-50 MG PO TABS: 1.0000 | ORAL_TABLET | Freq: Two times a day (BID) | ORAL | Status: DC | PRN

## 2024-07-23 MED ORDER — 0.9 % SODIUM CHLORIDE (POUR BTL) OPTIME
TOPICAL | Status: DC | PRN
  Administered 2024-07-23 (×2): 1000 mL

## 2024-07-23 MED ORDER — ROCURONIUM BROMIDE 10 MG/ML (PF) SYRINGE
PREFILLED_SYRINGE | INTRAVENOUS | Status: DC | PRN
  Administered 2024-07-23 (×2): 30 mg via INTRAVENOUS

## 2024-07-23 MED ORDER — PANTOPRAZOLE SODIUM 40 MG PO TBEC
40.0000 mg | DELAYED_RELEASE_TABLET | Freq: Two times a day (BID) | ORAL | Status: DC
  Administered 2024-07-23 – 2024-07-24 (×4): 40 mg via ORAL
  Filled 2024-07-23 (×2): qty 1

## 2024-07-23 MED ORDER — DEXMEDETOMIDINE HCL IN NACL 80 MCG/20ML IV SOLN
INTRAVENOUS | Status: AC
  Filled 2024-07-23: qty 20

## 2024-07-23 MED ORDER — VASHE WOUND IRRIGATION OPTIME
TOPICAL | Status: DC | PRN
Start: 2024-07-23 — End: 2024-07-23
  Administered 2024-07-23 (×2): 20 [oz_av]

## 2024-07-23 MED ORDER — ISOSORBIDE MONONITRATE ER 60 MG PO TB24
120.0000 mg | ORAL_TABLET | Freq: Every morning | ORAL | Status: DC
  Administered 2024-07-24 (×2): 120 mg via ORAL
  Filled 2024-07-23: qty 2

## 2024-07-23 MED ORDER — OXYCODONE HCL 5 MG/5ML PO SOLN: 5.0000 mg | Freq: Once | ORAL | Status: AC | PRN

## 2024-07-23 MED ORDER — ROSUVASTATIN CALCIUM 20 MG PO TABS
40.0000 mg | ORAL_TABLET | Freq: Every day | ORAL | Status: DC
  Administered 2024-07-24 (×2): 40 mg via ORAL
  Filled 2024-07-23: qty 2

## 2024-07-23 MED ORDER — PHENYLEPHRINE HCL-NACL 20-0.9 MG/250ML-% IV SOLN
INTRAVENOUS | Status: DC | PRN
  Administered 2024-07-23 (×2): 30 ug/min via INTRAVENOUS

## 2024-07-23 MED ORDER — PHENYLEPHRINE 80 MCG/ML (10ML) SYRINGE FOR IV PUSH (FOR BLOOD PRESSURE SUPPORT): PREFILLED_SYRINGE | INTRAVENOUS | Status: DC | PRN

## 2024-07-23 MED ORDER — SODIUM CHLORIDE 0.9 % IV SOLN: INTRAVENOUS | Status: DC

## 2024-07-23 MED ORDER — OXYCODONE HCL 5 MG PO TABS
ORAL_TABLET | ORAL | Status: AC
  Filled 2024-07-23: qty 1

## 2024-07-23 MED ORDER — ONDANSETRON HCL 4 MG/2ML IJ SOLN
INTRAMUSCULAR | Status: DC | PRN
  Administered 2024-07-23 (×2): 4 mg via INTRAVENOUS

## 2024-07-23 MED ORDER — ACETAMINOPHEN 325 MG PO TABS: 650.0000 mg | ORAL_TABLET | Freq: Four times a day (QID) | ORAL | Status: DC | PRN

## 2024-07-23 MED ORDER — ACETAMINOPHEN 10 MG/ML IV SOLN
1000.0000 mg | Freq: Once | INTRAVENOUS | Status: DC | PRN
Start: 2024-07-23 — End: 2024-07-23

## 2024-07-23 MED ORDER — HYDROMORPHONE HCL 1 MG/ML IJ SOLN: 0.2500 mg | INTRAMUSCULAR | Status: DC | PRN

## 2024-07-23 MED ORDER — CHLORHEXIDINE GLUCONATE 0.12 % MT SOLN
15.0000 mL | Freq: Once | OROMUCOSAL | Status: AC
  Administered 2024-07-23 (×2): 15 mL via OROMUCOSAL
  Filled 2024-07-23: qty 15

## 2024-07-23 MED ORDER — DEXMEDETOMIDINE HCL IN NACL 80 MCG/20ML IV SOLN
INTRAVENOUS | Status: DC | PRN
  Administered 2024-07-23 (×2): 8 ug via INTRAVENOUS

## 2024-07-23 MED ORDER — FUROSEMIDE 40 MG PO TABS
40.0000 mg | ORAL_TABLET | Freq: Two times a day (BID) | ORAL | Status: DC
Start: 2024-07-23 — End: 2024-07-24
  Administered 2024-07-23 – 2024-07-24 (×4): 40 mg via ORAL
  Filled 2024-07-23 (×2): qty 1

## 2024-07-23 MED ORDER — HYDROMORPHONE HCL 1 MG/ML IJ SOLN: 0.5000 mg | INTRAMUSCULAR | Status: DC | PRN

## 2024-07-23 MED ORDER — DULOXETINE HCL 20 MG PO CPEP
20.0000 mg | ORAL_CAPSULE | Freq: Two times a day (BID) | ORAL | Status: DC
  Administered 2024-07-23 – 2024-07-24 (×4): 20 mg via ORAL
  Filled 2024-07-23 (×3): qty 1

## 2024-07-23 MED ORDER — ORAL CARE MOUTH RINSE: 15.0000 mL | Freq: Once | OROMUCOSAL | Status: AC

## 2024-07-23 MED ORDER — LIDOCAINE 2% (20 MG/ML) 5 ML SYRINGE
INTRAMUSCULAR | Status: DC | PRN
  Administered 2024-07-23 (×2): 50 mg via INTRAVENOUS

## 2024-07-23 MED ORDER — OXYCODONE HCL 5 MG PO TABS
5.0000 mg | ORAL_TABLET | ORAL | Status: DC | PRN
  Administered 2024-07-23 (×2): 5 mg via ORAL
  Filled 2024-07-23: qty 1

## 2024-07-23 MED ORDER — HEPARIN SODIUM (PORCINE) 5000 UNIT/ML IJ SOLN
5000.0000 [IU] | Freq: Three times a day (TID) | INTRAMUSCULAR | Status: DC
  Administered 2024-07-23 – 2024-07-24 (×6): 5000 [IU] via SUBCUTANEOUS
  Filled 2024-07-23 (×3): qty 1

## 2024-07-23 MED ORDER — INSULIN ASPART 100 UNIT/ML IJ SOLN
0.0000 [IU] | Freq: Every day | INTRAMUSCULAR | Status: DC
  Administered 2024-07-23 (×2): 3 [IU] via SUBCUTANEOUS

## 2024-07-23 MED ORDER — ACETAMINOPHEN 650 MG RE SUPP: 650.0000 mg | Freq: Four times a day (QID) | RECTAL | Status: DC | PRN

## 2024-07-23 MED ORDER — ALLOPURINOL 100 MG PO TABS
100.0000 mg | ORAL_TABLET | Freq: Every day | ORAL | Status: DC
  Administered 2024-07-24 (×2): 100 mg via ORAL
  Filled 2024-07-23: qty 1

## 2024-07-23 MED ORDER — FENTANYL CITRATE (PF) 250 MCG/5ML IJ SOLN
INTRAMUSCULAR | Status: AC
  Filled 2024-07-23: qty 5

## 2024-07-23 MED ORDER — MIDAZOLAM HCL 2 MG/2ML IJ SOLN
INTRAMUSCULAR | Status: DC | PRN
  Administered 2024-07-23 (×2): 2 mg via INTRAVENOUS

## 2024-07-23 MED ORDER — INSULIN ASPART 100 UNIT/ML IJ SOLN
0.0000 [IU] | INTRAMUSCULAR | Status: DC | PRN
  Administered 2024-07-23 (×2): 4 [IU] via SUBCUTANEOUS
  Filled 2024-07-23: qty 1

## 2024-07-23 MED ORDER — SUCCINYLCHOLINE CHLORIDE 200 MG/10ML IV SOSY
PREFILLED_SYRINGE | INTRAVENOUS | Status: DC | PRN
  Administered 2024-07-23 (×2): 180 mg via INTRAVENOUS

## 2024-07-23 MED ORDER — OXYCODONE HCL 5 MG PO TABS
5.0000 mg | ORAL_TABLET | Freq: Once | ORAL | Status: AC | PRN
  Administered 2024-07-23 (×2): 5 mg via ORAL

## 2024-07-23 MED ORDER — METOPROLOL SUCCINATE ER 100 MG PO TB24
100.0000 mg | ORAL_TABLET | Freq: Every day | ORAL | Status: DC
  Administered 2024-07-24 (×2): 100 mg via ORAL
  Filled 2024-07-23: qty 1

## 2024-07-23 MED ORDER — PHENYLEPHRINE 80 MCG/ML (10ML) SYRINGE FOR IV PUSH (FOR BLOOD PRESSURE SUPPORT)
PREFILLED_SYRINGE | INTRAVENOUS | Status: DC | PRN
  Administered 2024-07-23 (×2): 80 ug via INTRAVENOUS

## 2024-07-23 MED ORDER — PROPOFOL 10 MG/ML IV BOLUS
INTRAVENOUS | Status: DC | PRN
  Administered 2024-07-23: 20 mg via INTRAVENOUS
  Administered 2024-07-23: 160 mg via INTRAVENOUS
  Administered 2024-07-23: 20 mg via INTRAVENOUS
  Administered 2024-07-23 (×4): 10 mg via INTRAVENOUS
  Administered 2024-07-23: 160 mg via INTRAVENOUS

## 2024-07-23 MED ORDER — PROPOFOL 10 MG/ML IV BOLUS
INTRAVENOUS | Status: AC
  Filled 2024-07-23: qty 20

## 2024-07-23 MED ORDER — SUGAMMADEX SODIUM 200 MG/2ML IV SOLN
INTRAVENOUS | Status: DC | PRN
  Administered 2024-07-23 (×2): 200 mg via INTRAVENOUS

## 2024-07-23 MED ORDER — ALBUTEROL SULFATE (2.5 MG/3ML) 0.083% IN NEBU: 2.5000 mg | INHALATION_SOLUTION | Freq: Four times a day (QID) | RESPIRATORY_TRACT | Status: DC | PRN

## 2024-07-23 MED ORDER — SODIUM CHLORIDE 0.9% FLUSH
3.0000 mL | Freq: Two times a day (BID) | INTRAVENOUS | Status: DC
  Administered 2024-07-23 – 2024-07-24 (×6): 3 mL via INTRAVENOUS

## 2024-07-23 MED ORDER — INSULIN ASPART 100 UNIT/ML IJ SOLN
0.0000 [IU] | Freq: Three times a day (TID) | INTRAMUSCULAR | Status: DC
  Administered 2024-07-23 – 2024-07-24 (×6): 5 [IU] via SUBCUTANEOUS

## 2024-07-23 SURGICAL SUPPLY — 38 items
BAG COUNTER SPONGE SURGICOUNT (BAG) ×2 IMPLANT
BNDG ELASTIC 4X5.8 VLCR STR LF (GAUZE/BANDAGES/DRESSINGS) IMPLANT
BNDG ELASTIC 6INX 5YD STR LF (GAUZE/BANDAGES/DRESSINGS) IMPLANT
BNDG GAUZE DERMACEA FLUFF 4 (GAUZE/BANDAGES/DRESSINGS) IMPLANT
CANISTER SUCTION 3000ML PPV (SUCTIONS) ×2 IMPLANT
CANISTER WOUNDNEG PRESSURE 500 (CANNISTER) IMPLANT
CLIP TI MEDIUM 6 (CLIP) ×4 IMPLANT
CLIP TI WIDE RED SMALL 6 (CLIP) ×2 IMPLANT
COVER SURGICAL LIGHT HANDLE (MISCELLANEOUS) ×2 IMPLANT
DRAPE HALF SHEET 40X57 (DRAPES) IMPLANT
DRAPE INCISE IOBAN 66X45 STRL (DRAPES) IMPLANT
DRAPE U-SHAPE 76X120 STRL (DRAPES) IMPLANT
DRESSING VERAFLO CLEANS CC MED (GAUZE/BANDAGES/DRESSINGS) IMPLANT
ELECTRODE REM PT RTRN 9FT ADLT (ELECTROSURGICAL) ×2 IMPLANT
GAUZE SPONGE 4X4 12PLY STRL (GAUZE/BANDAGES/DRESSINGS) ×2 IMPLANT
GAUZE XEROFORM 5X9 LF (GAUZE/BANDAGES/DRESSINGS) IMPLANT
GLOVE BIOGEL PI IND STRL 8 (GLOVE) ×2 IMPLANT
GOWN STRL REUS W/ TWL LRG LVL3 (GOWN DISPOSABLE) ×4 IMPLANT
GOWN STRL REUS W/TWL 2XL LVL3 (GOWN DISPOSABLE) ×4 IMPLANT
HEMOSTAT SNOW SURGICEL 2X4 (HEMOSTASIS) IMPLANT
IV NS IRRIG 3000ML ARTHROMATIC (IV SOLUTION) ×2 IMPLANT
KIT BASIN OR (CUSTOM PROCEDURE TRAY) ×2 IMPLANT
KIT TURNOVER KIT B (KITS) ×2 IMPLANT
NS IRRIG 1000ML POUR BTL (IV SOLUTION) ×2 IMPLANT
PACK CV ACCESS (CUSTOM PROCEDURE TRAY) IMPLANT
PACK GENERAL/GYN (CUSTOM PROCEDURE TRAY) IMPLANT
PACK UNIVERSAL I (CUSTOM PROCEDURE TRAY) ×2 IMPLANT
PAD ARMBOARD POSITIONER FOAM (MISCELLANEOUS) ×4 IMPLANT
PAD NEG PRESSURE SENSATRAC (MISCELLANEOUS) IMPLANT
POWDER MYRIAD MORCLLS FINE 500 (Miscellaneous) IMPLANT
SET HNDPC FAN SPRY TIP SCT (DISPOSABLE) IMPLANT
SUT ETHILON 3 0 PS 1 (SUTURE) IMPLANT
SUT VIC AB 2-0 CTX 36 (SUTURE) IMPLANT
SUT VIC AB 3-0 SH 27X BRD (SUTURE) IMPLANT
SUT VIC AB 4-0 PS2 18 (SUTURE) IMPLANT
TIP FAN IRRIG PULSAVAC PLUS (DISPOSABLE) IMPLANT
TOWEL GREEN STERILE (TOWEL DISPOSABLE) ×2 IMPLANT
WATER STERILE IRR 1000ML POUR (IV SOLUTION) ×2 IMPLANT

## 2024-07-23 NOTE — Anesthesia Procedure Notes (Signed)
 Procedure Name: Intubation Date/Time: 07/23/2024 11:42 AM  Performed by: Mollie Olivia SAUNDERS, CRNAPre-anesthesia Checklist: Patient identified, Emergency Drugs available, Suction available and Patient being monitored Patient Re-evaluated:Patient Re-evaluated prior to induction Oxygen  Delivery Method: Circle system utilized Preoxygenation: Pre-oxygenation with 100% oxygen  Induction Type: IV induction Ventilation: Mask ventilation without difficulty Laryngoscope Size: Mac and 4 Grade View: Grade I Tube type: Oral Tube size: 7.5 mm Number of attempts: 1 Airway Equipment and Method: Stylet and Oral airway Placement Confirmation: ETT inserted through vocal cords under direct vision, positive ETCO2 and breath sounds checked- equal and bilateral Secured at: 23 cm Tube secured with: Tape Dental Injury: Teeth and Oropharynx as per pre-operative assessment  Comments: Scientist, research (life sciences), srna placed

## 2024-07-23 NOTE — H&P (Signed)
 POST OPERATIVE OFFICE NOTE  Patient seen and examined in preop holding.  No complaints. No changes to medication history or physical exam since last seen in clinic. After discussing the risks and benefits of left leg BKA revision for nonhealing wounds, Joshua [name] elected to proceed.   Fonda FORBES Rim MD   CC:  F/u for surgery  HPI:  This is a 68 y.o. male who is s/p Left below knee amputation on 05/31/24 by Dr. Magda.  He had a diabetic food infection with no revascularization options.   Pt returns today for follow up staple removal. He was seen on 06/26/24 and at that time he had some maceration along incision line. Staples were left in place and he was started on Doxycycline  with concern of some infection starting. He says he completed the course of Doxycycline . He has been at home taking care of his incision on his own mostly. He does have HH that has been coming out sporadically. He says he was given mixed instructions between us  an [facility] RN. He reports some drainage from wound. Some phantom pains but manageable overall. He denies any fever or chills.   Allergies  Allergen Reactions   Amlodipine  Besylate     Other Reaction(s): leg cramps   Chlorthalidone Other (See Comments)    Causes dehydration, kidneys shut down  Other Reaction(s): Kidney Failure    Current Facility-Administered Medications  Medication Dose Route Frequency Provider Last Rate Last Admin   0.9 %  sodium chloride  infusion   Intravenous Continuous Ashyia Schraeder E, MD       0.9 %  sodium chloride  infusion   Intravenous Continuous Leopoldo Bruckner, MD 10 mL/hr at 07/23/24 0927 New Bag at 07/23/24 0927   ceFAZolin  (ANCEF ) IVPB 2g/100 mL premix  2 g Intravenous 30 min Pre-Op Ladarrell Cornwall E, MD       Chlorhexidine  Gluconate Cloth 2 % PADS 6 each  6 each Topical Once Wassim Kirksey E, MD       And   Chlorhexidine  Gluconate Cloth 2 % PADS 6 each  6 each Topical Once Julena Barbour E, MD       insulin  aspart  (novoLOG ) injection 0-14 Units  0-14 Units Subcutaneous Q2H PRN Leopoldo Bruckner, MD   4 Units at 07/23/24 0933     ROS:  See HPI  Physical Exam:  Vitals:   07/23/24 0858  BP: 135/83  Pulse: 94  Resp: 18  Temp: 98.7 F (37.1 C)  SpO2: 98%    Incision:   Left BKA dehisced as shown below. Staples removed as they are essentially not intact. SS drainage. Fibrinous tissue in wound bed.    Assessment/Plan:  This is a 68 y.o. male who is s/p: Left below knee amputation on 05/31/24 by Dr. Magda.  He had a diabetic food infection with no revascularization options.  Left BKA incision now dehisced with unhealthy tissue in wound bed and along incision. Some of this could be debrided at bedside but the tissue is not healthy along incision and feel he would be better served by surgical debridement and revision in OR. - Advised him in the mean time to wash with soap and water  pat dry. Apply wet to dry dressings daily.  - He is on Brillinta which will need held for surgery - Will arrange Revision/ I& D of left BKA in the near future with Dr. Magda Curry Damme, [facility] [phone]   Clinic MD:  Magda

## 2024-07-23 NOTE — Transfer of Care (Signed)
 Immediate Anesthesia Transfer of Care Note  Patient: Joshua [name]  Procedure(s) Performed: REVISION AMPUTATION, BELOW THE KNEE (Left) IRRIGATION AND DEBRIDEMENT WOUND (Left) APPLICATION, WOUND VAC (Left: Leg Lower)  Patient Location: PACU  Anesthesia Type:General  Level of Consciousness: awake, alert , and oriented  Airway & Oxygen  Therapy: Patient Spontanous Breathing and Patient connected to face mask oxygen   Post-op Assessment: Report given to RN and Post -op Vital signs reviewed and stable  Post vital signs: Reviewed and stable  Last Vitals:  Vitals Value Taken Time  BP 101/60 07/23/24 12:45  Temp 36.4 C 07/23/24 12:45  Pulse 79 07/23/24 12:54  Resp 20 07/23/24 12:54  SpO2 95 % 07/23/24 12:54  Vitals shown include unfiled device data.  Last Pain:  Vitals:   07/23/24 1245  TempSrc:   PainSc: 0-No pain         Complications: No notable events documented.

## 2024-07-23 NOTE — Progress Notes (Signed)
 Called and placed pt on tele box [id number]. Second verifier Jill,RN

## 2024-07-23 NOTE — Consult Note (Addendum)
 Initial Consultation Note   Patient: Joshua [name] DOB: Oct 22, 1956 PCP: Wonda Worth SQUIBB, PA DOA: 07/23/2024 DOS: the patient was seen and examined on 07/23/2024 Primary service: Claudene Maximino LABOR, MD  Referring physician: Dr. Lanis Reason for consult: Postoperative wound infection.  Assessment/Plan: Assessment and Plan:  Wound dehiscence of surgical site History of osteomyelitis Patient had underwent left BKA for osteomyelitis by Dr. Vonzell on 6/19.  Postoperatively had concern for possible infection when seen on 7/15 at which point staples were left in place and he placed on a course of doxycycline  which he completed.  However follow-up visit on 8/5 noted concern for wound dehiscence for which patient was admitted and taken for I&D and revision of the left BKA. - Admit to a surgical telemetry bed - Continue wound VAC - Pain control - Check CBC and BMP postoperatively - Per vascular surgery  Diabetes mellitus type 2 with long-term use of insulin  On admission glucose noted to be in the 200s.  Last available hemoglobin A1c noted to be 7 when checked on 03/14/2024. - Hypoglycemic protocols - CBGs before every meal with moderate SSI - Plan to adjust insulin  regimen as needed: CAD Hyperlipidemia Patient with a history of multiple PCI's. -Continue Crestor  - Hold Brilinta  and aspirin .  Check with vascular determine when medically appropriate to resume  Chronic kidney disease stage IV Patient baseline creatinine previously noted to be around 2.95 when checked on 7/25 - Check BMP  Essential hypertension Blood pressures currently well-controlled. - Continue metoprolol , Imdur , and Lasix   Anemia of chronic kidney disease Hemoglobin had been around 11 when checked last month. - Follow-up CBC postoperatively  GERD - Continue PPI  Obesity BMI 34.56 kg/m  TRH will continue to follow the patient.  HPI: Joshua [name] is a 68 y.o. male with past medical history of  CAD s/p multiple PCIs, HFpEF, HTN, DM2 c/b neuropathy, s/p R TMA in 2017, chronic poorly healing LLE heel wound with osteomyelitis s/p left below-knee amputation by Dr. Vonzell on 6/19, CKD4, OSA, and obesity presents due to postoperative wound dehiscence . He has been dealing with wound of his left foot for over a year.  Following the below-knee amputation performed by Dr. Vonzell on 6/19 he reported having poor wound healing.  He had been seen in the office on 7/15 and staples were left in place at that time due to concern for infection and he was placed on a course of doxycycline  which the patient completed.  At home he had home health coming but.SABRA  He was seen in the office a week ago by the physician assistant which it was noted that he had dehiscence of the surgical site and needed to have revision of his left BKA.  Patient had been given preoperative antibiotics.  Denies having any significant fever or chills  He has diabetes mellitus with blood sugar levels fluctuating between 130 and 140, with occasional spikes up to 250.    Review of Systems: As mentioned in the history of present illness. All other systems reviewed and are negative. Past Medical History:  Diagnosis Date   Acute coronary syndrome (HCC)    Anemia    B12 deficiency    Chest pain    CHF (congestive heart failure) (HCC)    Chronic combined systolic and diastolic CHF (congestive heart failure) (HCC)    a. 07/2015 Echo: EF 45-50%, mod LVH, mid apical/antsept AK, Gr 1 DD.   Chronic low back pain    CKD (chronic  kidney disease), stage III (HCC)    Constipation    COPD (chronic obstructive pulmonary disease) (HCC)    Coronary artery disease    a. 2012 s/p MI/PCI: s/p DES to mid/dist RCA and overlapping DES to LAD;  b. 07/2015 Cath: LAD 10% ISR, D1 40(jailed), LCX 28m, OM2 30, OM3 30, RCA 17m ISR, 10d ISR; c. 07/2016 NSTEMI/PCI: LAD 85ost/p/m (3.5x20 Synergy DES overlapping prior stent), D1 40, LCX 60m, OM2 95 (2.75x16 Synergy  DES), OM3 30, RCA 52m, 10d.   Depression    Diabetic gastroparesis (HCC)    Diabetic polyneuropathy (HCC)    DKA (diabetic ketoacidoses) 03/14/2018   GERD (gastroesophageal reflux disease)    Gout    Heart failure (HCC)    Heart murmur    History of MI (myocardial infarction)    Hyperlipemia    Hypertension    Hypertensive heart disease    Hypokalemia    Ischemic cardiomyopathy    a. 07/2015 Echo: EF 45-50%, mod LVH, mid-apicalanteroseptal AK, Gr1 DD.   Joint pain    Kidney problem    Morbid obesity (HCC)    OSA on CPAP    Osteoarthritis    Osteomyelitis (HCC)    a. 07/2016 MTP joint of R great toe s/p transmetatarsal amputation.   Other fatigue    Polyneuropathy in diabetes(357.2)    Pulmonary sarcoidosis (HCC)    problems w/it years ago; no problems anymore (07/03/2014)   Rectal bleeding    Shortness of breath    Shortness of breath on exertion    Sleep apnea    Stomach ulcer    Swallowing difficulty    Type II diabetes mellitus (HCC)    Past Surgical History:  Procedure Laterality Date   ABDOMINAL AORTOGRAM W/LOWER EXTREMITY N/A 11/29/2022   Procedure: ABDOMINAL AORTOGRAM W/LOWER EXTREMITY;  Surgeon: Sheree Penne Bruckner, MD;  Location: [facility] INVASIVE CV LAB;  Service: Cardiovascular;  Laterality: N/A;   AMPUTATION Right 07/24/2016   Procedure: TRANSMETATARSAL FOOT AMPUTATION;  Surgeon: Jerona LULLA Sage, MD;  Location: [facility] OR;  Service: Orthopedics;  Laterality: Right;   AMPUTATION Left 05/31/2024   Procedure: AMPUTATION BELOW KNEE;  Surgeon: [name], MD;  Location[facility] OR;  Service: Vascular;  Laterality: Left;   APPLICATION OF WOUND VAC Left 11/30/2022   Procedure: APPLICATION OF WOUND VAC;  Surgeon: Silva Juliene SAUNDERS, DPM;  Location: [facility] OR;  Service: Podiatry;  Laterality: Left;   BALLOON DILATION N/A 03/21/2018   Procedure: BALLOON DILATION;  Surgeon: Donnald Charleston, MD;  Location: [facility] ENDOSCOPY;  Service: Endoscopy;  Laterality: N/A;   BIOPSY  03/16/2022    Procedure: BIOPSY;  Surgeon: Dianna Specking, MD;  Location: [facility] ENDOSCOPY;  Service: Endoscopy;;   BREAST LUMPECTOMY Right ~ 2000   benign   CARDIAC CATHETERIZATION N/A 07/30/2015   Procedure: Left Heart Cath and Coronary Angiography;  Surgeon: Bruckner JONETTA Cash, MD;  Location: [facility] INVASIVE CV LAB;  Service: Cardiovascular;  Laterality: N/A;   CARDIAC CATHETERIZATION N/A 07/19/2016   Procedure: Left Heart Cath and Coronary Angiography;  Surgeon: Victory LELON Sharps, MD;  Location: [facility] INVASIVE CV LAB;  Service: Cardiovascular;  Laterality: N/A;   CARDIAC CATHETERIZATION N/A 07/29/2016   Procedure: Coronary Stent Intervention;  Surgeon: Victory LELON Sharps, MD;  Location: [facility] INVASIVE CV LAB;  Service: Cardiovascular;  Laterality: N/A;   CARDIAC CATHETERIZATION N/A 07/29/2016   Procedure: Intravascular Ultrasound/IVUS;  Surgeon: Victory LELON Sharps, MD;  Location: [facility] INVASIVE CV LAB;  Service: Cardiovascular;  Laterality: N/A;  CATARACT EXTRACTION W/ INTRAOCULAR LENS  IMPLANT, BILATERAL Bilateral 2014-2015   COLONOSCOPY WITH PROPOFOL  N/A 08/22/2017   Procedure: COLONOSCOPY WITH PROPOFOL ;  Surgeon: Dianna Specking, MD;  Location: [facility] ENDOSCOPY;  Service: Endoscopy;  Laterality: N/A;   COLONOSCOPY WITH PROPOFOL  N/A 03/16/2022   Procedure: COLONOSCOPY WITH PROPOFOL ;  Surgeon: Dianna Specking, MD;  Location: [facility] ENDOSCOPY;  Service: Endoscopy;  Laterality: N/A;   CORONARY ANGIOPLASTY WITH STENT PLACEMENT  10/31/2011   30 % MID ATRIOVENTRICULAR GROOVE CX STENOSIS. 90 % MID RCA.PTA TO LAD/STENTING OF TANDEM 60-70%. LAD STENOSIS 99% REDUCED TO 0%.3.0 X PROMUS DES POSTDILATED TO 3.25MM.   CORONARY ANGIOPLASTY WITH STENT PLACEMENT  07/03/2014   2   CORONARY STENT INTERVENTION N/A 04/09/2019   Procedure: CORONARY STENT INTERVENTION;  Surgeon: Dann Candyce RAMAN, MD;  Location: [facility] INVASIVE CV LAB;  Service: Cardiovascular;  Laterality: N/A;   CORONARY STENT INTERVENTION N/A 08/22/2019   Procedure: CORONARY STENT  INTERVENTION;  Surgeon: Swaziland, Peter M, MD;  Location: [facility] INVASIVE CV LAB;  Service: Cardiovascular;  Laterality: N/A;   ESOPHAGOGASTRODUODENOSCOPY (EGD) WITH PROPOFOL  N/A 03/21/2018   Procedure: ESOPHAGOGASTRODUODENOSCOPY (EGD) WITH PROPOFOL ;  Surgeon: Donnald Charleston, MD;  Location: [facility] ENDOSCOPY;  Service: Endoscopy;  Laterality: N/A;   ESOPHAGOGASTRODUODENOSCOPY (EGD) WITH PROPOFOL  N/A 03/16/2022   Procedure: ESOPHAGOGASTRODUODENOSCOPY (EGD) WITH PROPOFOL ;  Surgeon: Dianna Specking, MD;  Location: [facility] ENDOSCOPY;  Service: Endoscopy;  Laterality: N/A;   GRAFT APPLICATION Left 11/30/2022   Procedure: GRAFT APPLICATION;  Surgeon: Silva Juliene SAUNDERS, DPM;  Location: [facility] OR;  Service: Podiatry;  Laterality: Left;   I & D EXTREMITY Right 10/30/2013   Procedure: IRRIGATION AND DEBRIDEMENT RIGHT SMALL FINGER POSSIBLE SECONDARY WOUND CLOSURE;  Surgeon: Donnice DELENA Robinsons, MD;  Location: [facility] ORS;  Service: Orthopedics;  Laterality: Right;  Robinsons is available after 4pm   I & D EXTREMITY Right 11/01/2013   Procedure:  IRRIGATION AND DEBRIDEMENT RIGHT  PROXIMAL PHALANGEAL LEVEL AMPUTATION;  Surgeon: Donnice DELENA Robinsons, MD;  Location: [facility] ORS;  Service: Orthopedics;  Laterality: Right;   INCISION AND DRAINAGE Right 10/28/2013   Procedure: INCISION AND DRAINAGE Right small finger;  Surgeon: Donnice DELENA Robinsons, MD;  Location: [facility] ORS;  Service: Orthopedics;  Laterality: Right;   INCISION AND DRAINAGE OF WOUND Left 11/07/2022   Procedure: IRRIGATION AND DEBRIDEMENT WOUND;  Surgeon: Malvin Marsa FALCON, DPM;  Location: [facility] OR;  Service: Podiatry;  Laterality: Left;  Left foot ucler debridement and bone biopsy   LEFT HEART CATH Left 11/03/2011   Procedure: LEFT HEART CATH;  Surgeon: Debby DELENA Sor, MD;  Location: [facility] CATH LAB;  Service: Cardiovascular;  Laterality: Left;   LEFT HEART CATH AND CORONARY ANGIOGRAPHY N/A 04/09/2019   Procedure: LEFT HEART CATH AND CORONARY ANGIOGRAPHY;  Surgeon: Dann Candyce RAMAN, MD;   Location: [facility] INVASIVE CV LAB;  Service: Cardiovascular;  Laterality: N/A;   LEFT HEART CATHETERIZATION WITH CORONARY ANGIOGRAM N/A 07/03/2014   Procedure: LEFT HEART CATHETERIZATION WITH CORONARY ANGIOGRAM;  Surgeon: Victory LELON Claudene DOUGLAS, MD;  Location: [facility] CATH LAB;  Service: Cardiovascular;  Laterality: N/A;   PERCUTANEOUS CORONARY STENT INTERVENTION (PCI-S) Left 11/03/2011   Procedure: PERCUTANEOUS CORONARY STENT INTERVENTION (PCI-S);  Surgeon: Debby DELENA Sor, MD;  Location: [facility] CATH LAB;  Service: Cardiovascular;  Laterality: Left;   RIGHT/LEFT HEART CATH AND CORONARY ANGIOGRAPHY N/A 08/22/2019   Procedure: RIGHT/LEFT HEART CATH AND CORONARY ANGIOGRAPHY;  Surgeon: Swaziland, Peter M, MD;  Location: [facility] INVASIVE CV LAB;  Service: Cardiovascular;  Laterality: N/A;   TOE AMPUTATION  Right ~ 2010   2nd toe   TOE AMPUTATION Right 2012   3rd toe   WOUND DEBRIDEMENT Left 11/30/2022   Procedure: DEBRIDEMENT WOUND;  Surgeon: Silva Juliene SAUNDERS, DPM;  Location: [facility] OR;  Service: Podiatry;  Laterality: Left;   Social History:  reports that he has never smoked. He has never used smokeless tobacco. He reports current alcohol use. He reports that he does not use drugs.  Allergies  Allergen Reactions   Amlodipine  Besylate     Other Reaction(s): leg cramps   Chlorthalidone Other (See Comments)    Causes dehydration, kidneys shut down  Other Reaction(s): Kidney Failure    Family History  Problem Relation Age of Onset   Sudden death Mother    Heart attack Maternal Aunt    Diabetes Maternal Grandmother    Hypertension Maternal Grandmother    Sudden death Other    Neuropathy Neg Hx     Prior to Admission medications   Medication Sig Start Date End Date Taking? Authorizing Provider  acetaminophen  (TYLENOL ) 325 MG tablet Take 2 tablets (650 mg total) by mouth every 6 (six) hours as needed for pain and for fever greater than or equal to 101 06/22/24  Yes   allopurinol  (ZYLOPRIM ) 100 MG tablet Take 0.5 tablets  (50 mg total) by mouth daily. Patient taking differently: Take 100 mg by mouth daily. 06/06/24  Yes Gonfa, Taye T, MD  DULoxetine  (CYMBALTA ) 20 MG capsule Take 1 capsule (20 mg total) by mouth 2 (two) times daily. 05/24/24  Yes   famotidine  (PEPCID ) 20 MG tablet Take 20 mg by mouth at bedtime.   Yes [provider]  furosemide  (LASIX ) 40 MG tablet Take 1 tablet (40 mg total) by mouth 2 (two) times daily. 05/24/24  Yes   gabapentin  (NEURONTIN ) 100 MG capsule Take 1 capsule (100 mg total) by mouth at bedtime. 05/24/24  Yes   insulin  glargine (LANTUS  SOLOSTAR) 100 UNIT/ML Solostar Pen Inject 25 Units into the skin daily.   Yes [provider]  insulin  lispro (HUMALOG  KWIKPEN) 100 UNIT/ML KwikPen Inject 5-14 Units into the skin 3 (three) times daily with meals. Inject 5 units with meals, add the following per glucose level. CBG 70 - 120: 0 units CBG 121 - 150: 1 unit CBG 151 - 200: 2 units CBG 201 - 250: 3 units CBG 251 - 300: 5 units CBG 301 - 350: 7 units CBG 351 - 400: 9 units MDD: 50 units 02/23/24  Yes Trixie File, MD  isosorbide  mononitrate (IMDUR ) 60 MG 24 hr tablet Take 2 tablets by mouth in the morning AND 1 tablet every evening. 10/15/23  Yes Fernand Prost, MD  latanoprost  (XALATAN ) 0.005 % ophthalmic solution Place 1 drop into both eyes at bedtime. 10/15/23  Yes Fernand Prost, MD  losartan  (COZAAR ) 25 MG tablet Take 25 mg by mouth daily.   Yes [provider]  metoprolol  succinate (TOPROL -XL) 100 MG 24 hr tablet Take 1 tablet (100 mg total) by mouth daily. 05/24/24  Yes   nitroGLYCERIN  (NITROSTAT ) 0.4 MG SL tablet Place 1 tablet (0.4 mg total) under the tongue every 5 (five) minutes as needed for chest pain. 07/13/23  Yes Fernand Prost, MD  pantoprazole  (PROTONIX ) 40 MG tablet Take 40 mg by mouth 2 (two) times daily before a meal.   Yes [provider]  rosuvastatin  (CRESTOR ) 10 MG tablet Take 1 tablet (10 mg total) by mouth daily. Patient taking differently: Take  40 mg by mouth daily. 06/06/24  Yes Gonfa, Taye T, MD  Semaglutide , 1 MG/DOSE, (OZEMPIC , 1 MG/DOSE,) 4 MG/3ML SOPN Inject 1 mg into the skin once a week. Patient taking differently: Inject 1 mg into the skin once a week. On Thursday 08/31/23  Yes Trixie File, MD  ticagrelor  (BRILINTA ) 90 MG TABS tablet Take 1 tablet (90 mg total) by mouth 2 (two) times daily. 10/15/23  Yes Fernand Prost, MD  aspirin  EC 81 MG tablet Take 1 tablet (81 mg total) by mouth daily. Patient taking differently: Take 81 mg by mouth in the morning. 07/13/23   Fernand Prost, MD  Blood Glucose Monitoring Suppl (BLOOD GLUCOSE MONITOR SYSTEM) w/Device KIT Use in the morning, at noon, and at bedtime. 07/27/23   Marylu Gee, DO  Continuous Glucose Receiver (FREESTYLE LIBRE 3 READER) DEVI Use as advised 08/31/23   Trixie File, MD  Continuous Glucose Sensor (FREESTYLE LIBRE 3 PLUS SENSOR) MISC 1 each by Does not apply route every 14 (fourteen) days. 08/31/23   Trixie File, MD  cyclobenzaprine  (FLEXERIL ) 10 MG tablet Take 1 tablet (10 mg total) by mouth every 8 (eight) hours as needed for muscle spasms. Patient not taking: Reported on 07/18/2024 06/22/24     Insulin  Pen Needle 32G X 4 MM MISC Use with Lantus  07/13/23     oxyCODONE  (OXY IR/ROXICODONE ) 5 MG immediate release tablet Take 1 tablet (5 mg total) by mouth every 8 (eight) hours as needed for up to 5 days for severe pain (pain score 7-10). Patient not taking: Reported on 07/18/2024 06/06/24   Gonfa, Taye T, MD  senna-docusate (SENOKOT-S) 8.6-50 MG tablet Take 1-2 tablets by mouth 2 (two) times daily between meals as needed for mild constipation or moderate constipation. Patient not taking: Reported on 07/18/2024 06/06/24   Kathrin Mignon DASEN, MD    Physical Exam: Vitals:   07/23/24 1245 07/23/24 1300 07/23/24 1315 07/23/24 1330  BP: 101/60 123/72 123/71 129/66  Pulse: 77 80 79 84  Resp: (!) 22 14 13 20   Temp: 97.6 F (36.4 C)   98.5 F (36.9 C)  TempSrc:      SpO2: 100%  95% 96% 98%  Weight:      Height:        Constitutional: Obese elderly male currently in no acute distress Eyes: PERRL, lids and conjunctivae normal ENMT: Mucous membranes are moist. Normal dentition.  Neck: normal, supple  Respiratory: clear to auscultation bilaterally, no wheezing, no crackles. Normal respiratory effort. No accessory muscle use.  Cardiovascular: Regular rate and rhythm, no murmurs / rubs / gallops. No extremity edema. 2+ pedal pulses.   Abdomen: no tenderness, no masses palpated.  Bowel sounds positive.  Musculoskeletal: no clubbing / cyanosis.  Transmetatarsal amputation of the right foot.  Left BKA currently with wound VAC in place. Skin: no rashes  Neurologic: CN 2-12 grossly intact.  Strength 5/5 in all 4.  Psychiatric: Normal judgment and insight. Alert and oriented x 3. Normal mood.   Data Reviewed:   Reviewed available results at this time.   Family Communication: None Primary team communication:  Thank you very much for involving us  in the care of your patient.  Author: Maximino DELENA Sharps, MD 07/23/2024 1:49 PM  For on call review [email or web address].

## 2024-07-23 NOTE — Op Note (Signed)
    NAME: Joshua [name]    MRN: [id number] DOB: January 12, 1956    DATE OF OPERATION: 07/23/2024  PREOP DIAGNOSIS:    Nonhealing left below-knee amputation incision  POSTOP DIAGNOSIS:    Same  PROCEDURE:    Left below-knee amputation incision, debridement of soft tissue, dermis Wound VAC placement  SURGEON: Fonda FORBES Rim  ASSIST: Sherrilee Holster, PA  ANESTHESIA: General  EBL: 20 mL  INDICATIONS:    Joshua [name] is a 68 y.o. male with history of left-sided below-knee amputation.  He presented for staple removal with some concern for infection and was started on doxycycline .  He continues to have drainage from the wound bed.  After discussing risks and benefits of left below-knee amputation incision and debridement for nonhealing amputation site, Joshua [name] elected to proceed.  FINDINGS:   Healthy, viable stump.  Devitalized tissue at both medially and laterally at to the incision site.  TECHNIQUE:   Patient was brought to the OR laid in supine position.  General esthesia induced and the patient's prepped and draped in standard fashion.  The case began with sharp debridement of devitalized tissue on the medial and lateral aspects of the below-knee amputation stump.  This involved dermis and soft tissue debridement.  Lateral wound measured 10 x 2 x 1 cm, medial wound measured 5 x 3 x 2 cm.  The wound bed was irrigated with copious amounts of saline, followed by Vashe solution.  I placed 500 mg of myriad more cell powder in the wound bed to promote healing due to his comorbidities.  I elected to place a VAC for secondary wound healing.  This is placed to 125 mmHg suction.  Fonda FORBES Rim, MD [facility] DATE OF DICTATION:   07/23/2024

## 2024-07-23 NOTE — Progress Notes (Signed)
 Ordered pt late lunch tray

## 2024-07-24 ENCOUNTER — Other Ambulatory Visit (HOSPITAL_COMMUNITY): Payer: Self-pay

## 2024-07-24 ENCOUNTER — Other Ambulatory Visit: Payer: Self-pay | Admitting: Internal Medicine

## 2024-07-24 ENCOUNTER — Encounter (HOSPITAL_COMMUNITY): Payer: Self-pay | Admitting: Vascular Surgery

## 2024-07-24 DIAGNOSIS — Z794 Long term (current) use of insulin: Secondary | ICD-10-CM

## 2024-07-24 DIAGNOSIS — T8131XA Disruption of external operation (surgical) wound, not elsewhere classified, initial encounter: Secondary | ICD-10-CM | POA: Diagnosis not present

## 2024-07-24 LAB — CBC
HCT: 32.4 % — ABNORMAL LOW (ref 39.0–52.0)
Hemoglobin: 10.4 g/dL — ABNORMAL LOW (ref 13.0–17.0)
MCH: 29.3 pg (ref 26.0–34.0)
MCHC: 32.1 g/dL (ref 30.0–36.0)
MCV: 91.3 fL (ref 80.0–100.0)
Platelets: 163 K/uL (ref 150–400)
RBC: 3.55 MIL/uL — ABNORMAL LOW (ref 4.22–5.81)
RDW: 15.5 % (ref 11.5–15.5)
WBC: 7.9 K/uL (ref 4.0–10.5)
nRBC: 0 % (ref 0.0–0.2)

## 2024-07-24 LAB — BASIC METABOLIC PANEL WITH GFR
Anion gap: 10 (ref 5–15)
BUN: 40 mg/dL — ABNORMAL HIGH (ref 8–23)
CO2: 19 mmol/L — ABNORMAL LOW (ref 22–32)
Calcium: 8.7 mg/dL — ABNORMAL LOW (ref 8.9–10.3)
Chloride: 111 mmol/L (ref 98–111)
Creatinine, Ser: 2.31 mg/dL — ABNORMAL HIGH (ref 0.61–1.24)
GFR, Estimated: 31 mL/min — ABNORMAL LOW (ref >60)
Glucose, Bld: 202 mg/dL — ABNORMAL HIGH (ref 70–99)
Potassium: 3.7 mmol/L (ref 3.5–5.1)
Sodium: 140 mmol/L (ref 135–145)

## 2024-07-24 LAB — GLUCOSE, CAPILLARY
Glucose-Capillary: 225 mg/dL — ABNORMAL HIGH (ref 70–99)
Glucose-Capillary: 210 mg/dL — ABNORMAL HIGH (ref 70–99)

## 2024-07-24 MED ORDER — OXYCODONE HCL 5 MG PO TABS
5.0000 mg | ORAL_TABLET | Freq: Three times a day (TID) | ORAL | 0 refills | Status: AC | PRN
  Filled 2024-07-24: qty 15, 5d supply, fill #0

## 2024-07-24 MED ORDER — ROSUVASTATIN CALCIUM 10 MG PO TABS: 40.0000 mg | ORAL_TABLET | Freq: Every day | ORAL | Status: DC

## 2024-07-24 NOTE — Discharge Summary (Signed)
 Physician Discharge Summary  Joshua [name] FMW:[id number] DOB: [birth year] DOA: 07/23/2024  PCP: Wonda Worth SQUIBB, PA  Admit date: 07/23/2024 Discharge date: 07/24/2024  Admitted From: Home Discharge disposition: Home   Recommendations for Outpatient Follow-Up:   Wound VAC per vascular- Recommend beginning VAC change Friday v next Monday  Home health   Discharge Diagnosis:   Principal Problem:   Surgical wound dehiscence, initial encounter Active Problems:   History of osteomyelitis   Type 2 diabetes mellitus with complication, with long-term current use of insulin  (HCC)   Hyperlipidemia   CAD S/P multiple PCIs   CKD (chronic kidney disease), stage IV (HCC)   Essential hypertension   Anemia of chronic disease   GERD (gastroesophageal reflux disease)   Obesity (BMI 30-39.9)    Discharge Condition: Improved.  Diet recommendation: Low sodium, heart healthy.  Carbohydrate-modified    Code status: Full.   History of Present Illness:     68 y.o. male who is s/p Left below knee amputation on 05/31/24 by Dr. Magda.  He had a diabetic food infection with no revascularization options.    Pt returns today for follow up staple removal. He was seen on 06/26/24 and at that time he had some maceration along incision line. Staples were left in place and he was started on Doxycycline  with concern of some infection starting. He says he completed the course of Doxycycline . He has been at home taking care of his incision on his own mostly. He does have HH that has been coming out sporadically. He says he was given mixed instructions between us  an [facility] RN. He reports some drainage from wound. Some phantom pains but manageable overall. He denies any fever or chills.      Hospital Course by Problem:   68 y.o. male is s/p L BKA revision 1 Day Post-Op  - beginning VAC change Friday v next Monday  -TOC consulted for [facility] and home vac - Close follow-up with vascular  Diabetes  mellitus type 2 with long-term use of insulin  On admission glucose noted to be in the 200s.  Last available hemoglobin A1c noted to be 7 when checked on 03/14/2024. -Resume home meds  CAD Hyperlipidemia Patient with a history of multiple PCI's. -Continue Crestor  - Resume aspirin  and Brilinta    Chronic kidney disease stage IV Patient baseline creatinine previously noted to be around 2.95 when checked on 7/25 -trending down-- outpatient follow up   Essential hypertension Blood pressures currently well-controlled. - Continue metoprolol , Imdur , and Lasix    Anemia of chronic kidney disease H&H stable   GERD - Continue PPI   Obesity BMI 34.56 kg/m   Medical Consultants:   Vascular   Discharge Exam:   Vitals:   07/24/24 0848 07/24/24 0911  BP: 122/67 122/67  Pulse: 88 88  Resp: 18   Temp: 98 F (36.7 C)   SpO2: 100%    Vitals:   07/23/24 2034 07/24/24 0529 07/24/24 0848 07/24/24 0911  BP: (!) 145/79 136/80 122/67 122/67  Pulse: 80 82 88 88  Resp: 18  18   Temp: 98 F (36.7 C) 98.2 F (36.8 C) 98 F (36.7 C)   TempSrc: Oral Oral Oral   SpO2: 100% 100% 100%   Weight:      Height:        General exam: Appears calm and comfortable.    The results of significant diagnostics from this hospitalization (including imaging, microbiology, ancillary and laboratory) are listed below for reference.  Procedures and Diagnostic Studies:   No results found.   Labs:   Basic Metabolic Panel: Recent Labs  Lab 07/23/24 1618 07/24/24 0624  NA 139 140  K 3.9 3.7  CL 110 111  CO2 18* 19*  GLUCOSE 227* 202*  BUN 44* 40*  CREATININE 2.51* 2.31*  CALCIUM  8.6* 8.7*   GFR Estimated Creatinine Clearance: 38.3 mL/min (A) (by C-G formula based on SCr of 2.31 mg/dL (H)). Liver Function Tests: Recent Labs  Lab 07/23/24 1618  AST 23  ALT 22  ALKPHOS 125  BILITOT 0.2  PROT 6.7  ALBUMIN 2.9*   No results for input(s): LIPASE, AMYLASE in the last 168  hours. No results for input(s): AMMONIA in the last 168 hours. Coagulation profile No results for input(s): INR, PROTIME in the last 168 hours.  CBC: Recent Labs  Lab 07/23/24 1618 07/24/24 0624  WBC 8.7 7.9  NEUTROABS 6.3  --   HGB 10.6* 10.4*  HCT 33.8* 32.4*  MCV 92.6 91.3  PLT 179 163   Cardiac Enzymes: No results for input(s): CKTOTAL, CKMB, CKMBINDEX, TROPONINI in the last 168 hours. BNP: Invalid input(s): POCBNP CBG: Recent Labs  Lab 07/23/24 0855 07/23/24 1246 07/23/24 1612 07/23/24 2036 07/24/24 0850  GLUCAP 201* 206* 207* 275* 225*   D-Dimer No results for input(s): DDIMER in the last 72 hours. Hgb A1c No results for input(s): HGBA1C in the last 72 hours. Lipid Profile No results for input(s): CHOL, HDL, LDLCALC, TRIG, CHOLHDL, LDLDIRECT in the last 72 hours. Thyroid  function studies No results for input(s): TSH, T4TOTAL, T3FREE, THYROIDAB in the last 72 hours.  Invalid input(s): FREET3 Anemia work up No results for input(s): VITAMINB12, FOLATE, FERRITIN, TIBC, IRON, RETICCTPCT in the last 72 hours. Microbiology No results found for this or any previous visit (from the past 240 hours).   Discharge Instructions:   Discharge Instructions     Diet - low sodium heart healthy   Complete by: As directed    Diet Carb Modified   Complete by: As directed    Discharge wound care:   Complete by: As directed    Wound vac   Increase activity slowly   Complete by: As directed       Allergies as of 07/24/2024       Reactions   Amlodipine  Besylate    Other Reaction(s): leg cramps   Chlorthalidone Other (See Comments)   Causes dehydration, kidneys shut down Other Reaction(s): Kidney Failure        Medication List     PAUSE taking these medications    losartan  25 MG tablet Wait to take this until your doctor or other care provider tells you to start again. Commonly known as: COZAAR  Take 25  mg by mouth daily.       STOP taking these medications    cyclobenzaprine  10 MG tablet Commonly known as: FLEXERIL        TAKE these medications    acetaminophen  325 MG tablet Commonly known as: TYLENOL  Take 2 tablets (650 mg total) by mouth every 6 (six) hours as needed for pain and for fever greater than or equal to 101   allopurinol  100 MG tablet Commonly known as: ZYLOPRIM  Take 0.5 tablets (50 mg total) by mouth daily. What changed: how much to take   aspirin  EC 81 MG tablet Take 1 tablet (81 mg total) by mouth daily. What changed: when to take this   BD Pen Needle Nano U/F 32G X 4 MM Misc Generic  drug: Insulin  Pen Needle Use with Lantus    Blood Glucose Monitor System w/Device Kit Use in the morning, at noon, and at bedtime.   Brilinta  90 MG Tabs tablet Generic drug: ticagrelor  Take 1 tablet (90 mg total) by mouth 2 (two) times daily.   DULoxetine  20 MG capsule Commonly known as: CYMBALTA  Take 1 capsule (20 mg total) by mouth 2 (two) times daily.   famotidine  20 MG tablet Commonly known as: PEPCID  Take 20 mg by mouth at bedtime.   FreeStyle Libre 3 Plus Sensor Misc 1 each by Does not apply route every 14 (fourteen) days.   FreeStyle Libre 3 Reader [employer] Use as advised   furosemide  40 MG tablet Commonly known as: LASIX  Take 1 tablet (40 mg total) by mouth 2 (two) times daily.   gabapentin  100 MG capsule Commonly known as: NEURONTIN  Take 1 capsule (100 mg total) by mouth at bedtime.   insulin  lispro 100 UNIT/ML KwikPen Commonly known as: HumaLOG  KwikPen Inject 5-14 Units into the skin 3 (three) times daily with meals. Inject 5 units with meals, add the following per glucose level. CBG 70 - 120: 0 units CBG 121 - 150: 1 unit CBG 151 - 200: 2 units CBG 201 - 250: 3 units CBG 251 - 300: 5 units CBG 301 - 350: 7 units CBG 351 - 400: 9 units MDD: 50 units   isosorbide  mononitrate 60 MG 24 hr tablet Commonly known as: IMDUR  Take 2 tablets by mouth in the  morning AND 1 tablet every evening.   Lantus  SoloStar 100 UNIT/ML Solostar Pen Generic drug: insulin  glargine Inject 25 Units into the skin daily.   latanoprost  0.005 % ophthalmic solution Commonly known as: XALATAN  Place 1 drop into both eyes at bedtime.   metoprolol  succinate 100 MG 24 hr tablet Commonly known as: TOPROL -XL Take 1 tablet (100 mg total) by mouth daily.   nitroGLYCERIN  0.4 MG SL tablet Commonly known as: NITROSTAT  Place 1 tablet (0.4 mg total) under the tongue every 5 (five) minutes as needed for chest pain.   oxyCODONE  5 MG immediate release tablet Commonly known as: Oxy IR/ROXICODONE  Take 1 tablet (5 mg total) by mouth every 8 (eight) hours as needed for up to 5 days for severe pain (pain score 7-10).   Ozempic  (1 MG/DOSE) 4 MG/3ML Sopn Generic drug: Semaglutide  (1 MG/DOSE) Inject 1 mg into the skin once a week. What changed: additional instructions   pantoprazole  40 MG tablet Commonly known as: PROTONIX  Take 40 mg by mouth 2 (two) times daily before a meal.   rosuvastatin  10 MG tablet Commonly known as: CRESTOR  Take 4 tablets (40 mg total) by mouth daily.   senna-docusate 8.6-50 MG tablet Commonly known as: Senokot-S Take 1-2 tablets by mouth 2 (two) times daily between meals as needed for mild constipation or moderate constipation.               Durable Medical Equipment  (From admission, onward)           Start     Ordered   07/23/24 1412  For home use only DME Negative pressure wound device  Once       Comments: Needs home vac for left BKA wound vac. Wound measurements are 10 x 2 x 1 cm and 5 x 3 x 2cm. First wound vac change on 8/18, then twice weekly afterwards  Question Answer Comment  Frequency of dressing change 2 times per week   Length of need 3 Months  Dressing type Foam   Amount of suction 125 mm/Hg   Pressure application Continuous pressure   Supplies 10 canisters and 15 dressings per month for duration of therapy       07/23/24 1411              Discharge Care Instructions  (From admission, onward)           Start     Ordered   07/24/24 0000  Discharge wound care:       Comments: Wound vac   07/24/24 1056            Follow-up Information     [facility] [place] Follow up in 3 week(s).   Specialty: Vascular Surgery Contact information: [address] [place]  [address] [phone]        [facility] Follow up.   Specialty: Home Health Services Why: Someone will call you to schedule first resumptio of care visit. Contact information: [address] [place] [address] [phone]                  Time coordinating discharge: 45  Signed:  Harlene RAYMOND Bowl DO  [facility] 07/24/2024, 10:56 AM

## 2024-07-24 NOTE — Progress Notes (Signed)
 Discharge instructions given to pt. Pt verbalized understanding of all teaching and had no further questions. Home wound vac attached

## 2024-07-24 NOTE — TOC Initial Note (Signed)
 Transition of Care [facility]) - Initial/Assessment Note    Patient Details  Name: Joshua [name] MRN: [id number] Date of Birth: Dec 29, 1955  Transition of Care [facility]) CM/SW Contact:    Jeoffrey LITTIE Moose, LCSW Phone Number: 07/24/2024, 10:36 AM  Clinical Narrative:                 Pt admitted from home due to concerns from home health of open wound on recent left BKA. Pt lives with his son. CSW provided pt with community resource packet. No other current TOC needs please consult as needs arise.         Patient Goals and CMS Choice            Expected Discharge Plan and Services                                              Prior Living Arrangements/Services                       Activities of Daily Living   ADL Screening (condition at time of admission) Independently performs ADLs?: Yes (appropriate for developmental age) Is the patient deaf or have difficulty hearing?: No Does the patient have difficulty seeing, even when wearing glasses/contacts?: No Does the patient have difficulty concentrating, remembering, or making decisions?: No  Permission Sought/Granted                  Emotional Assessment              Admission diagnosis:  Dehiscence of amputation stump of left lower extremity (HCC) [T87.81] Diabetic foot infection (HCC) [Z88.371, L08.9] Patient Active Problem List   Diagnosis Date Noted   History of osteomyelitis 07/23/2024   Anemia of chronic disease 07/23/2024   Obesity (BMI 30-39.9) 07/23/2024   Vitamin D  deficiency 07/28/2023   DKA (diabetic ketoacidosis) (HCC) 07/10/2023   Pharyngitis 07/10/2023   Lactic acidosis 07/10/2023   SIRS (systemic inflammatory response syndrome) (HCC) 07/10/2023   Elevated troponin 07/10/2023   Diabetic foot ulcer (HCC) 12/02/2022   Acute osteomyelitis of metatarsal bone of left foot (HCC) 11/30/2022   Left foot pain 11/26/2022   Protein-calorie malnutrition, severe 11/08/2022    Osteomyelitis (HCC) 11/06/2022   Surgical wound dehiscence, initial encounter 11/06/2022   Osteomyelitis of fifth toe of left foot (HCC) 11/05/2022   (HFpEF) heart failure with preserved ejection fraction (HCC) 11/05/2022   CKD (chronic kidney disease), stage IV (HCC) 11/05/2022   Dysphagia 03/16/2022   History of colonic polyps 03/16/2022   Spinal stenosis of lumbar region 11/03/2021   Mixed diabetic hyperlipidemia associated with type 2 diabetes mellitus (HCC) 08/11/2021   Chronic low back pain    Hypokalemia 09/25/2020   Type 2 diabetes mellitus with complication, with long-term current use of insulin  (HCC) 09/24/2020   Unstable angina (HCC) 08/17/2019   Cardiac chest pain 08/16/2019   NSTEMI (non-ST elevated myocardial infarction) (HCC) 04/06/2019   ACS (acute coronary syndrome) (HCC) 04/06/2019   Sepsis (HCC) 03/15/2018   DKA (diabetic ketoacidosis) (HCC) 03/15/2018   Preop cardiovascular exam 08/22/2017   Acute renal failure superimposed on stage 3b chronic kidney disease (HCC) 05/19/2017   Essential hypertension    Status post transmetatarsal amputation of foot, right (HCC) 11/08/2016   Diabetic polyneuropathy associated with type 2 diabetes mellitus (HCC) 11/08/2016   Pure hypercholesterolemia  AKI (acute kidney injury) (HCC)    Acute blood loss anemia    Diabetes mellitus (HCC)    Physical debility 08/03/2016   Chronic osteomyelitis of right foot (HCC)    Cellulitis and abscess of leg, except foot    Non-healing wound of left lower extremity    Stage 3b chronic kidney disease (CKD) (HCC)    Unstable angina pectoris (HCC) 07/18/2016   Chronic diastolic CHF (congestive heart failure) (HCC)    Chest pain with moderate risk for cardiac etiology 06/24/2014   CAD S/P multiple PCIs 06/24/2014   Sleep apnea- on C-pap 06/24/2014   Class 3 severe obesity with serious comorbidity and body mass index (BMI) of 45.0 to 49.9 in adult 11/10/2013   Hyperglycemia 10/29/2013    Hyperlipidemia 01/12/2011   Benign essential HTN 01/12/2011   GERD (gastroesophageal reflux disease) 01/12/2011   Osteomyelitis of left foot (HCC) 01/12/2011   PCP:  Wonda Worth SQUIBB, PA Pharmacy:  No Pharmacies Listed    Social Drivers of Health (SDOH) Social History: SDOH Screenings   Food Insecurity: No Food Insecurity (07/23/2024)  Recent Concern: Food Insecurity - Food Insecurity Present (05/25/2024)  Housing: Unknown (07/23/2024)  Transportation Needs: No Transportation Needs (07/23/2024)  Recent Concern: Transportation Needs - Unmet Transportation Needs (05/25/2024)  Utilities: At Risk (07/23/2024)  Alcohol Screen: Medium Risk (07/27/2023)  Depression (PHQ2-9): Low Risk  (07/27/2023)  Financial Resource Strain: Medium Risk (07/27/2023)  Physical Activity: Inactive (07/27/2023)  Social Connections: Socially Isolated (07/23/2024)  Stress: Stress Concern Present (07/27/2023)  Tobacco Use: Low Risk  (07/23/2024)   SDOH Interventions:     Readmission Risk Interventions    11/08/2022    2:21 PM  Readmission Risk Prevention Plan  Transportation Screening Complete  PCP or Specialist Appt within 5-7 Days Complete  Home Care Screening Complete  Medication Review (RN CM) Complete

## 2024-07-24 NOTE — TOC Transition Note (Signed)
 Transition of Care [facility]) - Discharge Note   Patient Details  Name: Joshua [name] MRN: [id number] Date of Birth: September 12, 1956  [facility]) CM/SW Contact:  Tom-Johnson, Harvest Muskrat, RN Phone Number: 07/24/2024, 2:30 PM   Clinical Narrative:     Patient is scheduled for discharge today.  Readmission Risk Assessment done. Home health info, hospital f/u and discharge instructions on AVS. Prescriptions sent to [facility] pharmacy and patient will receive meds prior discharge. CM consulted for Woundvac at discharge. CM contacted Randine at [facility], orders received, insurance auth approved and MD signed necessary forms. CM went to patient's room with Proof of delivery form for patient to sign and to deliver Woundvac, patient not in room with hospital Woundvac disconnected. CM spoke with RN and was told she ordered a Prevena Woundvac and patient left without her knowledge. MD and Randine wit [employer] notified. Woundvac can be delivered to patient's home  No further TOC needs noted.         Final next level of care: Home w Home Health Services Barriers to Discharge: Barriers Resolved   Patient Goals and CMS Choice            Discharge Placement                       Discharge Plan and Services Additional resources added to the After Visit Summary for                            [facility] Arranged: PT, OT, RN [facility] Agency: [facility] Date [facility] Agency Contacted: 07/24/24 Time HH Agency Contacted: 1012 Representative spoke with at [facility] Agency: Amy  Social Drivers of Health (SDOH) Interventions SDOH Screenings   Food Insecurity: No Food Insecurity (07/23/2024)  Recent Concern: Food Insecurity - Food Insecurity Present (05/25/2024)  Housing: Unknown (07/23/2024)  Transportation Needs: No Transportation Needs (07/23/2024)  Recent Concern: Transportation Needs - Unmet Transportation Needs (05/25/2024)  Utilities: At Risk (07/23/2024)  Alcohol Screen: Medium Risk (07/27/2023)   Depression (PHQ2-9): Low Risk  (07/27/2023)  Financial Resource Strain: Medium Risk (07/27/2023)  Physical Activity: Inactive (07/27/2023)  Social Connections: Socially Isolated (07/23/2024)  Stress: Stress Concern Present (07/27/2023)  Tobacco Use: Low Risk  (07/23/2024)     Readmission Risk Interventions    11/08/2022    2:21 PM  Readmission Risk Prevention Plan  Transportation Screening Complete  PCP or Specialist Appt within 5-7 Days Complete  Home Care Screening Complete  Medication Review (RN CM) Complete

## 2024-07-24 NOTE — Progress Notes (Signed)
 Tele box 6N11 taken off pt and returned to drawer at front nurses station

## 2024-07-24 NOTE — Discharge Instructions (Signed)

## 2024-07-24 NOTE — Progress Notes (Addendum)
  Progress Note    07/24/2024 7:33 AM 1 Day Post-Op  Subjective:  pain is tolerable. No complaints   Vitals:   07/23/24 2034 07/24/24 0529  BP: (!) 145/79 136/80  Pulse: 80 82  Resp: 18   Temp: 98 F (36.7 C) 98.2 F (36.8 C)  SpO2: 100% 100%   Physical Exam: Lungs:  non labored Incisions:  L BKA vac with good seal Neurologic: A&O  CBC    Component Value Date/Time   WBC 7.9 07/24/2024 0624   RBC 3.55 (L) 07/24/2024 0624   HGB 10.4 (L) 07/24/2024 0624   HGB 12.3 (L) 08/11/2021 0929   HCT 32.4 (L) 07/24/2024 0624   HCT 39.1 08/11/2021 0929   PLT 163 07/24/2024 0624   PLT 254 08/11/2021 0929   MCV 91.3 07/24/2024 0624   MCV 84 08/11/2021 0929   MCH 29.3 07/24/2024 0624   MCHC 32.1 07/24/2024 0624   RDW 15.5 07/24/2024 0624   RDW 15.9 (H) 08/11/2021 0929   LYMPHSABS 1.6 07/23/2024 1618   LYMPHSABS 2.0 08/11/2021 0929   MONOABS 0.8 07/23/2024 1618   EOSABS 0.0 07/23/2024 1618   EOSABS 0.2 08/11/2021 0929   BASOSABS 0.0 07/23/2024 1618   BASOSABS 0.0 08/11/2021 0929    BMET    Component Value Date/Time   NA 140 07/24/2024 0624   NA 141 10/26/2021 1044   K 3.7 07/24/2024 0624   CL 111 07/24/2024 0624   CO2 19 (L) 07/24/2024 0624   GLUCOSE 202 (H) 07/24/2024 0624   BUN 40 (H) 07/24/2024 0624   BUN 33 (H) 10/26/2021 1044   CREATININE 2.31 (H) 07/24/2024 0624   CREATININE 2.11 (H) 11/18/2022 1605   CALCIUM  8.7 (L) 07/24/2024 0624   GFRNONAA 31 (L) 07/24/2024 0624   GFRNONAA 51 (L) 11/01/2016 1012   GFRAA 27 (L) 08/23/2019 0340   GFRAA 58 (L) 11/01/2016 1012    INR    Component Value Date/Time   INR 1.1 07/10/2023 2018     Intake/Output Summary (Last 24 hours) at 07/24/2024 0733 Last data filed at 07/24/2024 0530 Gross per 24 hour  Intake 890 ml  Output 2650 ml  Net -1760 ml     Assessment/Plan:  68 y.o. male is s/p L BKA revision 1 Day Post-Op   Wound vac with good seal TOC consulted for [facility] and home vac Ok for discharge once HH and vac  arranged; office will arrange follow up in 2-3 weeks   Donnice Sender, PA-C [facility] [phone] 07/24/2024 7:33 AM  VASCULAR STAFF ADDENDUM: I have independently interviewed and examined the patient. I agree with the above.  Recommend beginning VAC change Friday v next Monday  Fonda FORBES Rim MD [facility] Phone Number: [phone] 07/24/2024 12:23 PM

## 2024-07-24 NOTE — Plan of Care (Signed)
  Problem: Education: Goal: Knowledge of General Education information will improve Description: Including pain rating scale, medication(s)/side effects and non-pharmacologic comfort measures Outcome: Progressing   Problem: Clinical Measurements: Goal: Ability to maintain clinical measurements within normal limits will improve Outcome: Progressing   Problem: Clinical Measurements: Goal: Will remain free from infection Outcome: Progressing   Problem: Clinical Measurements: Goal: Will remain free from infection Outcome: Progressing

## 2024-07-25 DIAGNOSIS — Z5982 Transportation insecurity: Secondary | ICD-10-CM | POA: Diagnosis not present

## 2024-07-25 DIAGNOSIS — I5030 Unspecified diastolic (congestive) heart failure: Secondary | ICD-10-CM | POA: Diagnosis not present

## 2024-07-25 DIAGNOSIS — Z89512 Acquired absence of left leg below knee: Secondary | ICD-10-CM | POA: Diagnosis not present

## 2024-07-25 DIAGNOSIS — Z7902 Long term (current) use of antithrombotics/antiplatelets: Secondary | ICD-10-CM | POA: Diagnosis not present

## 2024-07-25 DIAGNOSIS — E1122 Type 2 diabetes mellitus with diabetic chronic kidney disease: Secondary | ICD-10-CM | POA: Diagnosis not present

## 2024-07-25 DIAGNOSIS — Z7985 Long-term (current) use of injectable non-insulin antidiabetic drugs: Secondary | ICD-10-CM | POA: Diagnosis not present

## 2024-07-25 DIAGNOSIS — I130 Hypertensive heart and chronic kidney disease with heart failure and stage 1 through stage 4 chronic kidney disease, or unspecified chronic kidney disease: Secondary | ICD-10-CM | POA: Diagnosis not present

## 2024-07-25 DIAGNOSIS — N184 Chronic kidney disease, stage 4 (severe): Secondary | ICD-10-CM | POA: Diagnosis not present

## 2024-07-25 DIAGNOSIS — Z4781 Encounter for orthopedic aftercare following surgical amputation: Secondary | ICD-10-CM | POA: Diagnosis not present

## 2024-07-25 DIAGNOSIS — D631 Anemia in chronic kidney disease: Secondary | ICD-10-CM | POA: Diagnosis not present

## 2024-07-25 DIAGNOSIS — Z7982 Long term (current) use of aspirin: Secondary | ICD-10-CM | POA: Diagnosis not present

## 2024-07-25 DIAGNOSIS — Z794 Long term (current) use of insulin: Secondary | ICD-10-CM | POA: Diagnosis not present

## 2024-07-25 DIAGNOSIS — I2510 Atherosclerotic heart disease of native coronary artery without angina pectoris: Secondary | ICD-10-CM | POA: Diagnosis not present

## 2024-07-25 DIAGNOSIS — E872 Acidosis, unspecified: Secondary | ICD-10-CM | POA: Diagnosis not present

## 2024-07-25 DIAGNOSIS — E1140 Type 2 diabetes mellitus with diabetic neuropathy, unspecified: Secondary | ICD-10-CM | POA: Diagnosis not present

## 2024-07-25 NOTE — Anesthesia Postprocedure Evaluation (Signed)
 Anesthesia Post Note  Patient: Joshua [name]  Procedure(s) Performed: REVISION AMPUTATION, BELOW THE KNEE (Left) IRRIGATION AND DEBRIDEMENT WOUND (Left) APPLICATION, WOUND VAC (Left: Leg Lower)     Patient location during evaluation: PACU Anesthesia Type: General Level of consciousness: awake and alert Pain management: pain level controlled Vital Signs Assessment: post-procedure vital signs reviewed and stable Respiratory status: spontaneous breathing, nonlabored ventilation and respiratory function stable Cardiovascular status: blood pressure returned to baseline and stable Postop Assessment: no apparent nausea or vomiting Anesthetic complications: no   No notable events documented.                  Londen Lorge

## 2024-07-27 ENCOUNTER — Telehealth (HOSPITAL_COMMUNITY): Payer: Self-pay

## 2024-07-27 ENCOUNTER — Telehealth: Payer: Self-pay

## 2024-07-27 DIAGNOSIS — Z89512 Acquired absence of left leg below knee: Secondary | ICD-10-CM | POA: Diagnosis not present

## 2024-07-27 DIAGNOSIS — Z7902 Long term (current) use of antithrombotics/antiplatelets: Secondary | ICD-10-CM | POA: Diagnosis not present

## 2024-07-27 DIAGNOSIS — D631 Anemia in chronic kidney disease: Secondary | ICD-10-CM | POA: Diagnosis not present

## 2024-07-27 DIAGNOSIS — T8781 Dehiscence of amputation stump: Secondary | ICD-10-CM | POA: Diagnosis not present

## 2024-07-27 DIAGNOSIS — E1122 Type 2 diabetes mellitus with diabetic chronic kidney disease: Secondary | ICD-10-CM | POA: Diagnosis not present

## 2024-07-27 DIAGNOSIS — Z7982 Long term (current) use of aspirin: Secondary | ICD-10-CM | POA: Diagnosis not present

## 2024-07-27 DIAGNOSIS — Z794 Long term (current) use of insulin: Secondary | ICD-10-CM | POA: Diagnosis not present

## 2024-07-27 DIAGNOSIS — I130 Hypertensive heart and chronic kidney disease with heart failure and stage 1 through stage 4 chronic kidney disease, or unspecified chronic kidney disease: Secondary | ICD-10-CM | POA: Diagnosis not present

## 2024-07-27 DIAGNOSIS — E1140 Type 2 diabetes mellitus with diabetic neuropathy, unspecified: Secondary | ICD-10-CM | POA: Diagnosis not present

## 2024-07-27 DIAGNOSIS — N184 Chronic kidney disease, stage 4 (severe): Secondary | ICD-10-CM | POA: Diagnosis not present

## 2024-07-27 DIAGNOSIS — I5030 Unspecified diastolic (congestive) heart failure: Secondary | ICD-10-CM | POA: Diagnosis not present

## 2024-07-27 DIAGNOSIS — I2510 Atherosclerotic heart disease of native coronary artery without angina pectoris: Secondary | ICD-10-CM | POA: Diagnosis not present

## 2024-07-27 DIAGNOSIS — Z955 Presence of coronary angioplasty implant and graft: Secondary | ICD-10-CM | POA: Diagnosis not present

## 2024-07-27 DIAGNOSIS — Z7985 Long-term (current) use of injectable non-insulin antidiabetic drugs: Secondary | ICD-10-CM | POA: Diagnosis not present

## 2024-07-27 NOTE — Telephone Encounter (Signed)
 Triage/Home Health/Wound Vac: -Current situation: pt at home with a Prevena wound that has a dead battery on his L stump.  -[employer] ([employer]) has been notified pt needs a traditional wound vac and supplies delivered ASAP by VVS Administrator.  Delivery ETA is not confirmed  -[facility] RN is expected at pt's home for wound care this evening.  If traditional wound vac has arrived, she will place it on stump, if not, she will apply aquacel dressing to stump until Monday in which she will place wound vac. RN Tully at [facility] will confirm orders with visiting [facility] RN Celcelia and advise RN to page MD if surgical site looks problematic or is pt is observed with s/s of an infection to report to the ED. -HH will return Monday to apply traditional Wound Vac.  [facility] [phone]

## 2024-07-27 NOTE — Telephone Encounter (Signed)
 Pt called stating that his Prevena wound vac did not have correct charging cord and the unit has died. He states the regular wound vac he had on after surgery was changed to Prevena right before he was d/c home. Per MD, he is to have the regular unit placed back on, not Prevena. I have made pt's [facility] nurse N[place]) aware and she will call her office to see if they can order the unit and supplies. In the meantime, she will do wet-dry dressing changes.

## 2024-07-27 NOTE — Care Management (Signed)
 1547 07/27/24 Case Manager received a call from [place] with [facility] concerning patient going home with a prevena vac rather than vac from [employer]. She said that patient's son came to get [employer] vac and didn't have all the charging components. This Clinical research associate asked that [place] contact the IT trainer on [place] in hopes that they can locate the apparatus that is needed. This CM updated TOC Supervisor, Erminio Herring, RN of this conversation. Per Nancy's note patient's Home Health Nurse will do wet to dry dressing changes for now.  Devere Cleaves, RNCM [phone]

## 2024-07-29 DIAGNOSIS — Z4781 Encounter for orthopedic aftercare following surgical amputation: Secondary | ICD-10-CM | POA: Diagnosis not present

## 2024-07-30 ENCOUNTER — Telehealth: Payer: Self-pay

## 2024-07-30 DIAGNOSIS — T8189XA Other complications of procedures, not elsewhere classified, initial encounter: Secondary | ICD-10-CM | POA: Diagnosis not present

## 2024-07-30 NOTE — Telephone Encounter (Signed)
 Triage/Home Health: -pt returned call to office and gave update on wound vac and HH.   -pt reports HH RN arrived at pt's home at 7 pm, placed a W-T-D dressing and wrapped his stump with an ACE bandage.  -pt reports at 1245 that [facility] RN called and will do expected wound vac application tomorrow, 07/31/2024 -pt reports no phone call or delivery of the wound vac or supplies thus far.

## 2024-07-30 NOTE — Telephone Encounter (Signed)
 Wound Vac Equipment: -called [employer] @ [phone]  Call/Confirmation # [id number] -Representative states 2 orders are in system for pt, reported delivery expected to a [facility] address, unable to confirm or estimate delivery date & time but we hope that it is as soon as possible.  Representative states he sent page to delivery people to call pt to confirm.

## 2024-07-30 NOTE — Telephone Encounter (Signed)
 Equipment/Wound Vac: -Called pt for update.   -pt stated he heard from the delivery person and they are on the way with his wound vac / supplies. -asked pt to call if he has any concerns or issues with HH.  Pt agrees.

## 2024-07-30 NOTE — Telephone Encounter (Signed)
Encounter in error

## 2024-07-30 NOTE — Telephone Encounter (Signed)
Encounter in Error 

## 2024-07-31 ENCOUNTER — Other Ambulatory Visit (HOSPITAL_COMMUNITY): Payer: Self-pay

## 2024-07-31 ENCOUNTER — Other Ambulatory Visit: Payer: Self-pay | Admitting: Internal Medicine

## 2024-07-31 DIAGNOSIS — I5030 Unspecified diastolic (congestive) heart failure: Secondary | ICD-10-CM | POA: Diagnosis not present

## 2024-07-31 DIAGNOSIS — Z7982 Long term (current) use of aspirin: Secondary | ICD-10-CM | POA: Diagnosis not present

## 2024-07-31 DIAGNOSIS — I130 Hypertensive heart and chronic kidney disease with heart failure and stage 1 through stage 4 chronic kidney disease, or unspecified chronic kidney disease: Secondary | ICD-10-CM | POA: Diagnosis not present

## 2024-07-31 DIAGNOSIS — E1122 Type 2 diabetes mellitus with diabetic chronic kidney disease: Secondary | ICD-10-CM | POA: Diagnosis not present

## 2024-07-31 DIAGNOSIS — N184 Chronic kidney disease, stage 4 (severe): Secondary | ICD-10-CM | POA: Diagnosis not present

## 2024-07-31 DIAGNOSIS — D631 Anemia in chronic kidney disease: Secondary | ICD-10-CM | POA: Diagnosis not present

## 2024-07-31 DIAGNOSIS — Z794 Long term (current) use of insulin: Secondary | ICD-10-CM | POA: Diagnosis not present

## 2024-07-31 DIAGNOSIS — T8781 Dehiscence of amputation stump: Secondary | ICD-10-CM | POA: Diagnosis not present

## 2024-07-31 DIAGNOSIS — Z89512 Acquired absence of left leg below knee: Secondary | ICD-10-CM | POA: Diagnosis not present

## 2024-07-31 DIAGNOSIS — Z7985 Long-term (current) use of injectable non-insulin antidiabetic drugs: Secondary | ICD-10-CM | POA: Diagnosis not present

## 2024-07-31 DIAGNOSIS — Z7902 Long term (current) use of antithrombotics/antiplatelets: Secondary | ICD-10-CM | POA: Diagnosis not present

## 2024-07-31 DIAGNOSIS — Z955 Presence of coronary angioplasty implant and graft: Secondary | ICD-10-CM | POA: Diagnosis not present

## 2024-07-31 DIAGNOSIS — E1140 Type 2 diabetes mellitus with diabetic neuropathy, unspecified: Secondary | ICD-10-CM | POA: Diagnosis not present

## 2024-07-31 DIAGNOSIS — I2510 Atherosclerotic heart disease of native coronary artery without angina pectoris: Secondary | ICD-10-CM | POA: Diagnosis not present

## 2024-07-31 DIAGNOSIS — E1142 Type 2 diabetes mellitus with diabetic polyneuropathy: Secondary | ICD-10-CM

## 2024-07-31 MED ORDER — LANTUS SOLOSTAR 100 UNIT/ML ~~LOC~~ SOPN
25.0000 [IU] | PEN_INJECTOR | Freq: Every morning | SUBCUTANEOUS | 0 refills | Status: DC
  Filled 2024-07-31: qty 15, 60d supply, fill #0

## 2024-08-01 ENCOUNTER — Telehealth: Payer: Self-pay

## 2024-08-01 ENCOUNTER — Other Ambulatory Visit (HOSPITAL_COMMUNITY): Payer: Self-pay

## 2024-08-01 ENCOUNTER — Other Ambulatory Visit: Payer: Self-pay

## 2024-08-01 NOTE — Telephone Encounter (Signed)
 Called pt to f/u that he has received his wound vac supplies and that it is on and running. He stated it is on and no issues at this time. He will call if he has any questions/concerns.

## 2024-08-02 ENCOUNTER — Other Ambulatory Visit (HOSPITAL_COMMUNITY): Payer: Self-pay

## 2024-08-02 ENCOUNTER — Telehealth: Payer: Self-pay

## 2024-08-02 NOTE — Telephone Encounter (Signed)
 Pt called reporting he fell in the shower and pulled the cord off of the wound vac. Pt reported he removed the wound vac dressing and dressed the wound with a wet-to-dry dressing. Pt reported his wound-care nurse will be coming 08/03/24 and she can take care of it then. Pt knows to call his wound-care nurse sooner if needed.

## 2024-08-03 DIAGNOSIS — Z89512 Acquired absence of left leg below knee: Secondary | ICD-10-CM | POA: Diagnosis not present

## 2024-08-03 DIAGNOSIS — N390 Urinary tract infection, site not specified: Secondary | ICD-10-CM | POA: Diagnosis not present

## 2024-08-03 DIAGNOSIS — Z794 Long term (current) use of insulin: Secondary | ICD-10-CM | POA: Diagnosis not present

## 2024-08-03 DIAGNOSIS — Z7902 Long term (current) use of antithrombotics/antiplatelets: Secondary | ICD-10-CM | POA: Diagnosis not present

## 2024-08-03 DIAGNOSIS — E1140 Type 2 diabetes mellitus with diabetic neuropathy, unspecified: Secondary | ICD-10-CM | POA: Diagnosis not present

## 2024-08-03 DIAGNOSIS — I130 Hypertensive heart and chronic kidney disease with heart failure and stage 1 through stage 4 chronic kidney disease, or unspecified chronic kidney disease: Secondary | ICD-10-CM | POA: Diagnosis not present

## 2024-08-03 DIAGNOSIS — T8781 Dehiscence of amputation stump: Secondary | ICD-10-CM | POA: Diagnosis not present

## 2024-08-03 DIAGNOSIS — I2510 Atherosclerotic heart disease of native coronary artery without angina pectoris: Secondary | ICD-10-CM | POA: Diagnosis not present

## 2024-08-03 DIAGNOSIS — N184 Chronic kidney disease, stage 4 (severe): Secondary | ICD-10-CM | POA: Diagnosis not present

## 2024-08-03 DIAGNOSIS — D631 Anemia in chronic kidney disease: Secondary | ICD-10-CM | POA: Diagnosis not present

## 2024-08-03 DIAGNOSIS — I5030 Unspecified diastolic (congestive) heart failure: Secondary | ICD-10-CM | POA: Diagnosis not present

## 2024-08-03 DIAGNOSIS — Z955 Presence of coronary angioplasty implant and graft: Secondary | ICD-10-CM | POA: Diagnosis not present

## 2024-08-03 DIAGNOSIS — E1122 Type 2 diabetes mellitus with diabetic chronic kidney disease: Secondary | ICD-10-CM | POA: Diagnosis not present

## 2024-08-03 DIAGNOSIS — Z7982 Long term (current) use of aspirin: Secondary | ICD-10-CM | POA: Diagnosis not present

## 2024-08-03 DIAGNOSIS — Z7985 Long-term (current) use of injectable non-insulin antidiabetic drugs: Secondary | ICD-10-CM | POA: Diagnosis not present

## 2024-08-06 ENCOUNTER — Telehealth: Payer: Self-pay

## 2024-08-06 ENCOUNTER — Other Ambulatory Visit (HOSPITAL_COMMUNITY): Payer: Self-pay

## 2024-08-06 DIAGNOSIS — I5030 Unspecified diastolic (congestive) heart failure: Secondary | ICD-10-CM | POA: Diagnosis not present

## 2024-08-06 DIAGNOSIS — E1122 Type 2 diabetes mellitus with diabetic chronic kidney disease: Secondary | ICD-10-CM | POA: Diagnosis not present

## 2024-08-06 DIAGNOSIS — D631 Anemia in chronic kidney disease: Secondary | ICD-10-CM | POA: Diagnosis not present

## 2024-08-06 DIAGNOSIS — I2510 Atherosclerotic heart disease of native coronary artery without angina pectoris: Secondary | ICD-10-CM | POA: Diagnosis not present

## 2024-08-06 DIAGNOSIS — I130 Hypertensive heart and chronic kidney disease with heart failure and stage 1 through stage 4 chronic kidney disease, or unspecified chronic kidney disease: Secondary | ICD-10-CM | POA: Diagnosis not present

## 2024-08-06 DIAGNOSIS — Z794 Long term (current) use of insulin: Secondary | ICD-10-CM | POA: Diagnosis not present

## 2024-08-06 DIAGNOSIS — Z7985 Long-term (current) use of injectable non-insulin antidiabetic drugs: Secondary | ICD-10-CM | POA: Diagnosis not present

## 2024-08-06 DIAGNOSIS — N184 Chronic kidney disease, stage 4 (severe): Secondary | ICD-10-CM | POA: Diagnosis not present

## 2024-08-06 DIAGNOSIS — Z7982 Long term (current) use of aspirin: Secondary | ICD-10-CM | POA: Diagnosis not present

## 2024-08-06 DIAGNOSIS — T8189XA Other complications of procedures, not elsewhere classified, initial encounter: Secondary | ICD-10-CM | POA: Diagnosis not present

## 2024-08-06 DIAGNOSIS — Z89512 Acquired absence of left leg below knee: Secondary | ICD-10-CM | POA: Diagnosis not present

## 2024-08-06 DIAGNOSIS — Z955 Presence of coronary angioplasty implant and graft: Secondary | ICD-10-CM | POA: Diagnosis not present

## 2024-08-06 DIAGNOSIS — E1140 Type 2 diabetes mellitus with diabetic neuropathy, unspecified: Secondary | ICD-10-CM | POA: Diagnosis not present

## 2024-08-06 DIAGNOSIS — Z7902 Long term (current) use of antithrombotics/antiplatelets: Secondary | ICD-10-CM | POA: Diagnosis not present

## 2024-08-06 DIAGNOSIS — T8781 Dehiscence of amputation stump: Secondary | ICD-10-CM | POA: Diagnosis not present

## 2024-08-06 NOTE — Telephone Encounter (Signed)
 Jenna, RN from [facility] care called to report she re place the wound vac on pt's leg.  She reported the wound did bleed. She did report there being a slight odor.  Joshua [name] requested Santyl  and Naproxen Cream.    Advised Joshua [name] to monitor the wound and call [facility] for signs of infection.

## 2024-08-07 ENCOUNTER — Other Ambulatory Visit: Payer: Self-pay

## 2024-08-07 ENCOUNTER — Other Ambulatory Visit (HOSPITAL_COMMUNITY): Payer: Self-pay

## 2024-08-07 ENCOUNTER — Other Ambulatory Visit: Payer: Self-pay | Admitting: Physician Assistant

## 2024-08-07 ENCOUNTER — Telehealth: Payer: Self-pay

## 2024-08-07 MED ORDER — ACETAMINOPHEN 325 MG PO TABS
650.0000 mg | ORAL_TABLET | Freq: Four times a day (QID) | ORAL | 1 refills | Status: AC | PRN
  Filled 2024-08-07: qty 240, 30d supply, fill #0
  Filled 2025-01-12: qty 240, 30d supply, fill #1

## 2024-08-07 MED ORDER — MUPIROCIN 2 % EX OINT
TOPICAL_OINTMENT | CUTANEOUS | 0 refills | Status: AC
  Filled 2024-08-07: qty 22, 30d supply, fill #0

## 2024-08-07 MED ORDER — SANTYL 250 UNIT/GM EX OINT
TOPICAL_OINTMENT | CUTANEOUS | 0 refills | Status: DC
  Filled 2024-08-07: qty 30, 30d supply, fill #0

## 2024-08-07 MED ORDER — SANTYL 250 UNIT/GM EX OINT
TOPICAL_OINTMENT | CUTANEOUS | 1 refills | Status: DC
  Filled 2024-08-07: qty 90, 30d supply, fill #0
  Filled 2024-09-12: qty 90, 30d supply, fill #1

## 2024-08-07 NOTE — Telephone Encounter (Signed)
 [name] called to request Santyl  with Naproxen cream. Naproxen cream was not approved but Santyl  RX was sent to [facility] by PA Corrina Baglia.

## 2024-08-08 ENCOUNTER — Other Ambulatory Visit (HOSPITAL_COMMUNITY): Payer: Self-pay

## 2024-08-08 DIAGNOSIS — D631 Anemia in chronic kidney disease: Secondary | ICD-10-CM | POA: Diagnosis not present

## 2024-08-08 DIAGNOSIS — Z89512 Acquired absence of left leg below knee: Secondary | ICD-10-CM | POA: Diagnosis not present

## 2024-08-08 DIAGNOSIS — I2510 Atherosclerotic heart disease of native coronary artery without angina pectoris: Secondary | ICD-10-CM | POA: Diagnosis not present

## 2024-08-08 DIAGNOSIS — Z955 Presence of coronary angioplasty implant and graft: Secondary | ICD-10-CM | POA: Diagnosis not present

## 2024-08-08 DIAGNOSIS — E1122 Type 2 diabetes mellitus with diabetic chronic kidney disease: Secondary | ICD-10-CM | POA: Diagnosis not present

## 2024-08-08 DIAGNOSIS — I130 Hypertensive heart and chronic kidney disease with heart failure and stage 1 through stage 4 chronic kidney disease, or unspecified chronic kidney disease: Secondary | ICD-10-CM | POA: Diagnosis not present

## 2024-08-08 DIAGNOSIS — Z7902 Long term (current) use of antithrombotics/antiplatelets: Secondary | ICD-10-CM | POA: Diagnosis not present

## 2024-08-08 DIAGNOSIS — I5030 Unspecified diastolic (congestive) heart failure: Secondary | ICD-10-CM | POA: Diagnosis not present

## 2024-08-08 DIAGNOSIS — N184 Chronic kidney disease, stage 4 (severe): Secondary | ICD-10-CM | POA: Diagnosis not present

## 2024-08-08 DIAGNOSIS — Z7982 Long term (current) use of aspirin: Secondary | ICD-10-CM | POA: Diagnosis not present

## 2024-08-08 DIAGNOSIS — Z7985 Long-term (current) use of injectable non-insulin antidiabetic drugs: Secondary | ICD-10-CM | POA: Diagnosis not present

## 2024-08-08 DIAGNOSIS — E1140 Type 2 diabetes mellitus with diabetic neuropathy, unspecified: Secondary | ICD-10-CM | POA: Diagnosis not present

## 2024-08-08 DIAGNOSIS — Z794 Long term (current) use of insulin: Secondary | ICD-10-CM | POA: Diagnosis not present

## 2024-08-08 DIAGNOSIS — T8781 Dehiscence of amputation stump: Secondary | ICD-10-CM | POA: Diagnosis not present

## 2024-08-09 ENCOUNTER — Telehealth: Payer: Self-pay

## 2024-08-09 NOTE — Telephone Encounter (Signed)
 Jenna, RN [phone] HH left message asking about Mupirocin  ointment and Santyl .  Mupirocin  ointment was ordered by PCP to be place in wound bed prior to wound vac application. Santyl  has been ordered.  Not sure if picked up by pt.  Called Jenna back, left message on her voicemail re: Mupirocin  and Santyl .

## 2024-08-10 DIAGNOSIS — I130 Hypertensive heart and chronic kidney disease with heart failure and stage 1 through stage 4 chronic kidney disease, or unspecified chronic kidney disease: Secondary | ICD-10-CM | POA: Diagnosis not present

## 2024-08-10 DIAGNOSIS — N184 Chronic kidney disease, stage 4 (severe): Secondary | ICD-10-CM | POA: Diagnosis not present

## 2024-08-10 DIAGNOSIS — Z955 Presence of coronary angioplasty implant and graft: Secondary | ICD-10-CM | POA: Diagnosis not present

## 2024-08-10 DIAGNOSIS — D631 Anemia in chronic kidney disease: Secondary | ICD-10-CM | POA: Diagnosis not present

## 2024-08-10 DIAGNOSIS — Z794 Long term (current) use of insulin: Secondary | ICD-10-CM | POA: Diagnosis not present

## 2024-08-10 DIAGNOSIS — E1140 Type 2 diabetes mellitus with diabetic neuropathy, unspecified: Secondary | ICD-10-CM | POA: Diagnosis not present

## 2024-08-10 DIAGNOSIS — I2510 Atherosclerotic heart disease of native coronary artery without angina pectoris: Secondary | ICD-10-CM | POA: Diagnosis not present

## 2024-08-10 DIAGNOSIS — Z7982 Long term (current) use of aspirin: Secondary | ICD-10-CM | POA: Diagnosis not present

## 2024-08-10 DIAGNOSIS — Z7985 Long-term (current) use of injectable non-insulin antidiabetic drugs: Secondary | ICD-10-CM | POA: Diagnosis not present

## 2024-08-10 DIAGNOSIS — Z89512 Acquired absence of left leg below knee: Secondary | ICD-10-CM | POA: Diagnosis not present

## 2024-08-10 DIAGNOSIS — Z7902 Long term (current) use of antithrombotics/antiplatelets: Secondary | ICD-10-CM | POA: Diagnosis not present

## 2024-08-10 DIAGNOSIS — T8781 Dehiscence of amputation stump: Secondary | ICD-10-CM | POA: Diagnosis not present

## 2024-08-10 DIAGNOSIS — I5030 Unspecified diastolic (congestive) heart failure: Secondary | ICD-10-CM | POA: Diagnosis not present

## 2024-08-10 DIAGNOSIS — E1122 Type 2 diabetes mellitus with diabetic chronic kidney disease: Secondary | ICD-10-CM | POA: Diagnosis not present

## 2024-08-13 DIAGNOSIS — R2689 Other abnormalities of gait and mobility: Secondary | ICD-10-CM | POA: Diagnosis not present

## 2024-08-13 DIAGNOSIS — M869 Osteomyelitis, unspecified: Secondary | ICD-10-CM | POA: Diagnosis not present

## 2024-08-13 DIAGNOSIS — S81802D Unspecified open wound, left lower leg, subsequent encounter: Secondary | ICD-10-CM | POA: Diagnosis not present

## 2024-08-14 ENCOUNTER — Other Ambulatory Visit (HOSPITAL_COMMUNITY): Payer: Self-pay

## 2024-08-14 DIAGNOSIS — Z794 Long term (current) use of insulin: Secondary | ICD-10-CM | POA: Diagnosis not present

## 2024-08-14 DIAGNOSIS — I130 Hypertensive heart and chronic kidney disease with heart failure and stage 1 through stage 4 chronic kidney disease, or unspecified chronic kidney disease: Secondary | ICD-10-CM | POA: Diagnosis not present

## 2024-08-14 DIAGNOSIS — Z955 Presence of coronary angioplasty implant and graft: Secondary | ICD-10-CM | POA: Diagnosis not present

## 2024-08-14 DIAGNOSIS — Z7982 Long term (current) use of aspirin: Secondary | ICD-10-CM | POA: Diagnosis not present

## 2024-08-14 DIAGNOSIS — I2510 Atherosclerotic heart disease of native coronary artery without angina pectoris: Secondary | ICD-10-CM | POA: Diagnosis not present

## 2024-08-14 DIAGNOSIS — Z7985 Long-term (current) use of injectable non-insulin antidiabetic drugs: Secondary | ICD-10-CM | POA: Diagnosis not present

## 2024-08-14 DIAGNOSIS — E1122 Type 2 diabetes mellitus with diabetic chronic kidney disease: Secondary | ICD-10-CM | POA: Diagnosis not present

## 2024-08-14 DIAGNOSIS — N184 Chronic kidney disease, stage 4 (severe): Secondary | ICD-10-CM | POA: Diagnosis not present

## 2024-08-14 DIAGNOSIS — T8781 Dehiscence of amputation stump: Secondary | ICD-10-CM | POA: Diagnosis not present

## 2024-08-14 DIAGNOSIS — D631 Anemia in chronic kidney disease: Secondary | ICD-10-CM | POA: Diagnosis not present

## 2024-08-14 DIAGNOSIS — E1140 Type 2 diabetes mellitus with diabetic neuropathy, unspecified: Secondary | ICD-10-CM | POA: Diagnosis not present

## 2024-08-14 DIAGNOSIS — I5030 Unspecified diastolic (congestive) heart failure: Secondary | ICD-10-CM | POA: Diagnosis not present

## 2024-08-14 DIAGNOSIS — Z7902 Long term (current) use of antithrombotics/antiplatelets: Secondary | ICD-10-CM | POA: Diagnosis not present

## 2024-08-14 DIAGNOSIS — Z89512 Acquired absence of left leg below knee: Secondary | ICD-10-CM | POA: Diagnosis not present

## 2024-08-15 ENCOUNTER — Telehealth: Payer: Self-pay

## 2024-08-15 NOTE — Telephone Encounter (Signed)
 Jenna called to get verbal orders for patient's care.  Patient has been seen by Jenna this week (week of 08/13/24) and will see our provider 08/16/24.  Verbal order given to see patient for skilled nursing 1x the week of 08/13/24.  Skilled nursing will then see pt 2x/week for 5 weeks.

## 2024-08-16 ENCOUNTER — Ambulatory Visit: Attending: Vascular Surgery | Admitting: Physician Assistant

## 2024-08-16 VITALS — BP 146/78 | HR 97 | Temp 97.9°F | Wt 234.0 lb

## 2024-08-16 DIAGNOSIS — Z89512 Acquired absence of left leg below knee: Secondary | ICD-10-CM

## 2024-08-16 NOTE — Progress Notes (Signed)
 POST OPERATIVE OFFICE NOTE    CC:  F/u for surgery  HPI:  This is a 68 y.o. male who is s/p revision of left below the knee amputation by Dr. Lanis on 07/23/2024.  He comes to clinic today with a wound VAC.  Home health is performing wound VAC changes 1-2 times per week.  He denies any excessive pain.  He believes the wound VAC is not functioning appropriately.  He denies fevers, chills, nausea/vomiting.  Allergies  Allergen Reactions   Amlodipine  Besylate     Other Reaction(s): leg cramps   Chlorthalidone Other (See Comments)    Causes dehydration, kidneys shut down  Other Reaction(s): Kidney Failure    Current Outpatient Medications  Medication Sig Dispense Refill   acetaminophen  (TYLENOL ) 325 MG tablet Take 2 tablets (650 mg total) by mouth every 6 (six) hours as needed. 240 tablet 1   allopurinol  (ZYLOPRIM ) 100 MG tablet Take 0.5 tablets (50 mg total) by mouth daily. (Patient taking differently: Take 100 mg by mouth daily.)     aspirin  EC 81 MG tablet Take 1 tablet (81 mg total) by mouth daily. (Patient taking differently: Take 81 mg by mouth in the morning.) 30 tablet 0   Blood Glucose Monitoring Suppl (BLOOD GLUCOSE MONITOR SYSTEM) w/Device KIT Use in the morning, at noon, and at bedtime. 1 kit 0   collagenase  (SANTYL ) 250 UNIT/GM ointment Apply ointment to the affected area 2-3 times weekly on woundbed prior to placing wound vac once a day. 90 g 1   Continuous Glucose Receiver (FREESTYLE LIBRE 3 READER) DEVI Use as advised 1 each 0   Continuous Glucose Sensor (FREESTYLE LIBRE 3 PLUS SENSOR) MISC 1 each by Does not apply route every 14 (fourteen) days. 6 each 3   DULoxetine  (CYMBALTA ) 20 MG capsule Take 1 capsule (20 mg total) by mouth 2 (two) times daily. 200 capsule 1   famotidine  (PEPCID ) 20 MG tablet Take 20 mg by mouth at bedtime.     furosemide  (LASIX ) 40 MG tablet Take 1 tablet (40 mg total) by mouth 2 (two) times daily. 200 tablet 0   gabapentin  (NEURONTIN ) 100 MG  capsule Take 1 capsule (100 mg total) by mouth at bedtime. 100 capsule 1   insulin  glargine (LANTUS  SOLOSTAR) 100 UNIT/ML Solostar Pen Inject 25 Units into the skin daily.     insulin  glargine (LANTUS  SOLOSTAR) 100 UNIT/ML Solostar Pen Inject 25 Units into the skin every morning. *please call office for appt* 15 mL 0   insulin  lispro (HUMALOG  KWIKPEN) 100 UNIT/ML KwikPen Inject 5-14 Units into the skin 3 (three) times daily with meals. Inject 5 units with meals, add the following per glucose level. CBG 70 - 120: 0 units CBG 121 - 150: 1 unit CBG 151 - 200: 2 units CBG 201 - 250: 3 units CBG 251 - 300: 5 units CBG 301 - 350: 7 units CBG 351 - 400: 9 units MDD: 50 units 30 mL 1   Insulin  Pen Needle 32G X 4 MM MISC Use with Lantus  100 each 0   isosorbide  mononitrate (IMDUR ) 60 MG 24 hr tablet Take 2 tablets by mouth in the morning AND 1 tablet every evening. 270 tablet 3   latanoprost  (XALATAN ) 0.005 % ophthalmic solution Place 1 drop into both eyes at bedtime. 7.5 mL 1   [Paused] losartan  (COZAAR ) 25 MG tablet Take 25 mg by mouth daily.     metoprolol  succinate (TOPROL -XL) 100 MG 24 hr tablet Take 1 tablet (100 mg  total) by mouth daily. 90 tablet 1   mupirocin  ointment (BACTROBAN ) 2 % Apply 2-3 times a week applying on woundbed prior to placing wound vac daily. 22 g 0   nitroGLYCERIN  (NITROSTAT ) 0.4 MG SL tablet Place 1 tablet (0.4 mg total) under the tongue every 5 (five) minutes as needed for chest pain. 25 tablet 3   oxyCODONE  (OXY IR/ROXICODONE ) 5 MG immediate release tablet Take 1 tablet (5 mg total) by mouth every 8 (eight) hours as needed for up to 5 days for severe pain (pain score 7-10). 15 tablet 0   pantoprazole  (PROTONIX ) 40 MG tablet Take 40 mg by mouth 2 (two) times daily before a meal.     rosuvastatin  (CRESTOR ) 10 MG tablet Take 4 tablets (40 mg total) by mouth daily.     Semaglutide , 1 MG/DOSE, (OZEMPIC , 1 MG/DOSE,) 4 MG/3ML SOPN Inject 1 mg into the skin once a week. (Patient taking  differently: Inject 1 mg into the skin once a week. On Thursday) 9 mL 3   senna-docusate (SENOKOT-S) 8.6-50 MG tablet Take 1-2 tablets by mouth 2 (two) times daily between meals as needed for mild constipation or moderate constipation. (Patient not taking: Reported on 07/18/2024)     ticagrelor  (BRILINTA ) 90 MG TABS tablet Take 1 tablet (90 mg total) by mouth 2 (two) times daily. 180 tablet 3   No current facility-administered medications for this visit.     ROS:  See HPI  Physical Exam:  Vitals:   08/16/24 1041  BP: (!) 146/78  Pulse: 97  Temp: 97.9 F (36.6 C)  TempSrc: Temporal  Weight: 234 lb (106.1 kg)    Incision: Left below the knee amputation with 2 open areas pictured below; no purulence noted; hide percent granulation tissue noted Neuro: A&O    Assessment/Plan:  This is a 68 y.o. male who is s/p: Revision of a left below the knee amputation with VAC placement  Wound VAC was changed in the office today.  Both wound beds appear to be healing well with granulation tissue.  I do not see any evidence of infection or purulence.  Continue wound VAC changes 1-2 times per week.  We will recheck the wounds in 1 month.   Donnice Sender, PA-C [facility] [phone]  Clinic MD:  Lanis

## 2024-08-20 DIAGNOSIS — N184 Chronic kidney disease, stage 4 (severe): Secondary | ICD-10-CM | POA: Diagnosis not present

## 2024-08-20 DIAGNOSIS — I2510 Atherosclerotic heart disease of native coronary artery without angina pectoris: Secondary | ICD-10-CM | POA: Diagnosis not present

## 2024-08-20 DIAGNOSIS — Z89512 Acquired absence of left leg below knee: Secondary | ICD-10-CM | POA: Diagnosis not present

## 2024-08-20 DIAGNOSIS — T8781 Dehiscence of amputation stump: Secondary | ICD-10-CM | POA: Diagnosis not present

## 2024-08-20 DIAGNOSIS — I5030 Unspecified diastolic (congestive) heart failure: Secondary | ICD-10-CM | POA: Diagnosis not present

## 2024-08-20 DIAGNOSIS — Z7985 Long-term (current) use of injectable non-insulin antidiabetic drugs: Secondary | ICD-10-CM | POA: Diagnosis not present

## 2024-08-20 DIAGNOSIS — I130 Hypertensive heart and chronic kidney disease with heart failure and stage 1 through stage 4 chronic kidney disease, or unspecified chronic kidney disease: Secondary | ICD-10-CM | POA: Diagnosis not present

## 2024-08-20 DIAGNOSIS — Z7982 Long term (current) use of aspirin: Secondary | ICD-10-CM | POA: Diagnosis not present

## 2024-08-20 DIAGNOSIS — Z794 Long term (current) use of insulin: Secondary | ICD-10-CM | POA: Diagnosis not present

## 2024-08-20 DIAGNOSIS — E1122 Type 2 diabetes mellitus with diabetic chronic kidney disease: Secondary | ICD-10-CM | POA: Diagnosis not present

## 2024-08-20 DIAGNOSIS — Z7902 Long term (current) use of antithrombotics/antiplatelets: Secondary | ICD-10-CM | POA: Diagnosis not present

## 2024-08-20 DIAGNOSIS — D631 Anemia in chronic kidney disease: Secondary | ICD-10-CM | POA: Diagnosis not present

## 2024-08-20 DIAGNOSIS — E1140 Type 2 diabetes mellitus with diabetic neuropathy, unspecified: Secondary | ICD-10-CM | POA: Diagnosis not present

## 2024-08-20 DIAGNOSIS — Z955 Presence of coronary angioplasty implant and graft: Secondary | ICD-10-CM | POA: Diagnosis not present

## 2024-08-22 DIAGNOSIS — Z7982 Long term (current) use of aspirin: Secondary | ICD-10-CM | POA: Diagnosis not present

## 2024-08-22 DIAGNOSIS — I130 Hypertensive heart and chronic kidney disease with heart failure and stage 1 through stage 4 chronic kidney disease, or unspecified chronic kidney disease: Secondary | ICD-10-CM | POA: Diagnosis not present

## 2024-08-22 DIAGNOSIS — T8781 Dehiscence of amputation stump: Secondary | ICD-10-CM | POA: Diagnosis not present

## 2024-08-22 DIAGNOSIS — Z7985 Long-term (current) use of injectable non-insulin antidiabetic drugs: Secondary | ICD-10-CM | POA: Diagnosis not present

## 2024-08-22 DIAGNOSIS — I2510 Atherosclerotic heart disease of native coronary artery without angina pectoris: Secondary | ICD-10-CM | POA: Diagnosis not present

## 2024-08-22 DIAGNOSIS — D631 Anemia in chronic kidney disease: Secondary | ICD-10-CM | POA: Diagnosis not present

## 2024-08-22 DIAGNOSIS — Z955 Presence of coronary angioplasty implant and graft: Secondary | ICD-10-CM | POA: Diagnosis not present

## 2024-08-22 DIAGNOSIS — Z89512 Acquired absence of left leg below knee: Secondary | ICD-10-CM | POA: Diagnosis not present

## 2024-08-22 DIAGNOSIS — I5030 Unspecified diastolic (congestive) heart failure: Secondary | ICD-10-CM | POA: Diagnosis not present

## 2024-08-22 DIAGNOSIS — E1122 Type 2 diabetes mellitus with diabetic chronic kidney disease: Secondary | ICD-10-CM | POA: Diagnosis not present

## 2024-08-22 DIAGNOSIS — Z7902 Long term (current) use of antithrombotics/antiplatelets: Secondary | ICD-10-CM | POA: Diagnosis not present

## 2024-08-22 DIAGNOSIS — N184 Chronic kidney disease, stage 4 (severe): Secondary | ICD-10-CM | POA: Diagnosis not present

## 2024-08-22 DIAGNOSIS — E1140 Type 2 diabetes mellitus with diabetic neuropathy, unspecified: Secondary | ICD-10-CM | POA: Diagnosis not present

## 2024-08-22 DIAGNOSIS — Z794 Long term (current) use of insulin: Secondary | ICD-10-CM | POA: Diagnosis not present

## 2024-08-23 ENCOUNTER — Telehealth: Payer: Self-pay

## 2024-08-24 NOTE — Telephone Encounter (Signed)
 Jenna from [facility] called for verbal orders.  Verbal order given for [facility] visits 2x a week for 8 weeks and 1x a week for 1 week.

## 2024-08-26 DIAGNOSIS — Z7985 Long-term (current) use of injectable non-insulin antidiabetic drugs: Secondary | ICD-10-CM | POA: Diagnosis not present

## 2024-08-26 DIAGNOSIS — E1122 Type 2 diabetes mellitus with diabetic chronic kidney disease: Secondary | ICD-10-CM | POA: Diagnosis not present

## 2024-08-26 DIAGNOSIS — E785 Hyperlipidemia, unspecified: Secondary | ICD-10-CM | POA: Diagnosis not present

## 2024-08-26 DIAGNOSIS — E1140 Type 2 diabetes mellitus with diabetic neuropathy, unspecified: Secondary | ICD-10-CM | POA: Diagnosis not present

## 2024-08-26 DIAGNOSIS — Z794 Long term (current) use of insulin: Secondary | ICD-10-CM | POA: Diagnosis not present

## 2024-08-26 DIAGNOSIS — Z89512 Acquired absence of left leg below knee: Secondary | ICD-10-CM | POA: Diagnosis not present

## 2024-08-26 DIAGNOSIS — Z993 Dependence on wheelchair: Secondary | ICD-10-CM | POA: Diagnosis not present

## 2024-08-26 DIAGNOSIS — N184 Chronic kidney disease, stage 4 (severe): Secondary | ICD-10-CM | POA: Diagnosis not present

## 2024-08-26 DIAGNOSIS — T8781 Dehiscence of amputation stump: Secondary | ICD-10-CM | POA: Diagnosis not present

## 2024-08-26 DIAGNOSIS — Z955 Presence of coronary angioplasty implant and graft: Secondary | ICD-10-CM | POA: Diagnosis not present

## 2024-08-26 DIAGNOSIS — I130 Hypertensive heart and chronic kidney disease with heart failure and stage 1 through stage 4 chronic kidney disease, or unspecified chronic kidney disease: Secondary | ICD-10-CM | POA: Diagnosis not present

## 2024-08-26 DIAGNOSIS — Z7982 Long term (current) use of aspirin: Secondary | ICD-10-CM | POA: Diagnosis not present

## 2024-08-26 DIAGNOSIS — M109 Gout, unspecified: Secondary | ICD-10-CM | POA: Diagnosis not present

## 2024-08-26 DIAGNOSIS — I2510 Atherosclerotic heart disease of native coronary artery without angina pectoris: Secondary | ICD-10-CM | POA: Diagnosis not present

## 2024-08-26 DIAGNOSIS — K219 Gastro-esophageal reflux disease without esophagitis: Secondary | ICD-10-CM | POA: Diagnosis not present

## 2024-08-26 DIAGNOSIS — D631 Anemia in chronic kidney disease: Secondary | ICD-10-CM | POA: Diagnosis not present

## 2024-08-26 DIAGNOSIS — I5030 Unspecified diastolic (congestive) heart failure: Secondary | ICD-10-CM | POA: Diagnosis not present

## 2024-08-26 DIAGNOSIS — Z7902 Long term (current) use of antithrombotics/antiplatelets: Secondary | ICD-10-CM | POA: Diagnosis not present

## 2024-08-29 DIAGNOSIS — T8781 Dehiscence of amputation stump: Secondary | ICD-10-CM | POA: Diagnosis not present

## 2024-08-29 DIAGNOSIS — I130 Hypertensive heart and chronic kidney disease with heart failure and stage 1 through stage 4 chronic kidney disease, or unspecified chronic kidney disease: Secondary | ICD-10-CM | POA: Diagnosis not present

## 2024-08-29 DIAGNOSIS — M109 Gout, unspecified: Secondary | ICD-10-CM | POA: Diagnosis not present

## 2024-08-29 DIAGNOSIS — Z7985 Long-term (current) use of injectable non-insulin antidiabetic drugs: Secondary | ICD-10-CM | POA: Diagnosis not present

## 2024-08-29 DIAGNOSIS — D631 Anemia in chronic kidney disease: Secondary | ICD-10-CM | POA: Diagnosis not present

## 2024-08-29 DIAGNOSIS — E1122 Type 2 diabetes mellitus with diabetic chronic kidney disease: Secondary | ICD-10-CM | POA: Diagnosis not present

## 2024-08-29 DIAGNOSIS — E785 Hyperlipidemia, unspecified: Secondary | ICD-10-CM | POA: Diagnosis not present

## 2024-08-29 DIAGNOSIS — K219 Gastro-esophageal reflux disease without esophagitis: Secondary | ICD-10-CM | POA: Diagnosis not present

## 2024-08-29 DIAGNOSIS — Z794 Long term (current) use of insulin: Secondary | ICD-10-CM | POA: Diagnosis not present

## 2024-08-29 DIAGNOSIS — Z7982 Long term (current) use of aspirin: Secondary | ICD-10-CM | POA: Diagnosis not present

## 2024-08-29 DIAGNOSIS — Z89512 Acquired absence of left leg below knee: Secondary | ICD-10-CM | POA: Diagnosis not present

## 2024-08-29 DIAGNOSIS — I2510 Atherosclerotic heart disease of native coronary artery without angina pectoris: Secondary | ICD-10-CM | POA: Diagnosis not present

## 2024-08-29 DIAGNOSIS — N184 Chronic kidney disease, stage 4 (severe): Secondary | ICD-10-CM | POA: Diagnosis not present

## 2024-08-29 DIAGNOSIS — Z7902 Long term (current) use of antithrombotics/antiplatelets: Secondary | ICD-10-CM | POA: Diagnosis not present

## 2024-08-29 DIAGNOSIS — E1140 Type 2 diabetes mellitus with diabetic neuropathy, unspecified: Secondary | ICD-10-CM | POA: Diagnosis not present

## 2024-08-29 DIAGNOSIS — I5030 Unspecified diastolic (congestive) heart failure: Secondary | ICD-10-CM | POA: Diagnosis not present

## 2024-08-29 DIAGNOSIS — Z955 Presence of coronary angioplasty implant and graft: Secondary | ICD-10-CM | POA: Diagnosis not present

## 2024-08-29 DIAGNOSIS — Z993 Dependence on wheelchair: Secondary | ICD-10-CM | POA: Diagnosis not present

## 2024-08-31 DIAGNOSIS — N184 Chronic kidney disease, stage 4 (severe): Secondary | ICD-10-CM | POA: Diagnosis not present

## 2024-08-31 DIAGNOSIS — Z7902 Long term (current) use of antithrombotics/antiplatelets: Secondary | ICD-10-CM | POA: Diagnosis not present

## 2024-08-31 DIAGNOSIS — I2510 Atherosclerotic heart disease of native coronary artery without angina pectoris: Secondary | ICD-10-CM | POA: Diagnosis not present

## 2024-08-31 DIAGNOSIS — M109 Gout, unspecified: Secondary | ICD-10-CM | POA: Diagnosis not present

## 2024-08-31 DIAGNOSIS — Z955 Presence of coronary angioplasty implant and graft: Secondary | ICD-10-CM | POA: Diagnosis not present

## 2024-08-31 DIAGNOSIS — Z7982 Long term (current) use of aspirin: Secondary | ICD-10-CM | POA: Diagnosis not present

## 2024-08-31 DIAGNOSIS — E1140 Type 2 diabetes mellitus with diabetic neuropathy, unspecified: Secondary | ICD-10-CM | POA: Diagnosis not present

## 2024-08-31 DIAGNOSIS — Z89512 Acquired absence of left leg below knee: Secondary | ICD-10-CM | POA: Diagnosis not present

## 2024-08-31 DIAGNOSIS — I130 Hypertensive heart and chronic kidney disease with heart failure and stage 1 through stage 4 chronic kidney disease, or unspecified chronic kidney disease: Secondary | ICD-10-CM | POA: Diagnosis not present

## 2024-08-31 DIAGNOSIS — E785 Hyperlipidemia, unspecified: Secondary | ICD-10-CM | POA: Diagnosis not present

## 2024-08-31 DIAGNOSIS — Z7985 Long-term (current) use of injectable non-insulin antidiabetic drugs: Secondary | ICD-10-CM | POA: Diagnosis not present

## 2024-08-31 DIAGNOSIS — I5030 Unspecified diastolic (congestive) heart failure: Secondary | ICD-10-CM | POA: Diagnosis not present

## 2024-08-31 DIAGNOSIS — E1122 Type 2 diabetes mellitus with diabetic chronic kidney disease: Secondary | ICD-10-CM | POA: Diagnosis not present

## 2024-08-31 DIAGNOSIS — Z794 Long term (current) use of insulin: Secondary | ICD-10-CM | POA: Diagnosis not present

## 2024-08-31 DIAGNOSIS — T8781 Dehiscence of amputation stump: Secondary | ICD-10-CM | POA: Diagnosis not present

## 2024-08-31 DIAGNOSIS — K219 Gastro-esophageal reflux disease without esophagitis: Secondary | ICD-10-CM | POA: Diagnosis not present

## 2024-08-31 DIAGNOSIS — Z993 Dependence on wheelchair: Secondary | ICD-10-CM | POA: Diagnosis not present

## 2024-08-31 DIAGNOSIS — D631 Anemia in chronic kidney disease: Secondary | ICD-10-CM | POA: Diagnosis not present

## 2024-09-03 DIAGNOSIS — K219 Gastro-esophageal reflux disease without esophagitis: Secondary | ICD-10-CM | POA: Diagnosis not present

## 2024-09-03 DIAGNOSIS — Z7982 Long term (current) use of aspirin: Secondary | ICD-10-CM | POA: Diagnosis not present

## 2024-09-03 DIAGNOSIS — Z7985 Long-term (current) use of injectable non-insulin antidiabetic drugs: Secondary | ICD-10-CM | POA: Diagnosis not present

## 2024-09-03 DIAGNOSIS — Z955 Presence of coronary angioplasty implant and graft: Secondary | ICD-10-CM | POA: Diagnosis not present

## 2024-09-03 DIAGNOSIS — D631 Anemia in chronic kidney disease: Secondary | ICD-10-CM | POA: Diagnosis not present

## 2024-09-03 DIAGNOSIS — Z7902 Long term (current) use of antithrombotics/antiplatelets: Secondary | ICD-10-CM | POA: Diagnosis not present

## 2024-09-03 DIAGNOSIS — Z794 Long term (current) use of insulin: Secondary | ICD-10-CM | POA: Diagnosis not present

## 2024-09-03 DIAGNOSIS — Z89512 Acquired absence of left leg below knee: Secondary | ICD-10-CM | POA: Diagnosis not present

## 2024-09-03 DIAGNOSIS — I5030 Unspecified diastolic (congestive) heart failure: Secondary | ICD-10-CM | POA: Diagnosis not present

## 2024-09-03 DIAGNOSIS — I2510 Atherosclerotic heart disease of native coronary artery without angina pectoris: Secondary | ICD-10-CM | POA: Diagnosis not present

## 2024-09-03 DIAGNOSIS — E1140 Type 2 diabetes mellitus with diabetic neuropathy, unspecified: Secondary | ICD-10-CM | POA: Diagnosis not present

## 2024-09-03 DIAGNOSIS — E1122 Type 2 diabetes mellitus with diabetic chronic kidney disease: Secondary | ICD-10-CM | POA: Diagnosis not present

## 2024-09-03 DIAGNOSIS — T8781 Dehiscence of amputation stump: Secondary | ICD-10-CM | POA: Diagnosis not present

## 2024-09-03 DIAGNOSIS — I130 Hypertensive heart and chronic kidney disease with heart failure and stage 1 through stage 4 chronic kidney disease, or unspecified chronic kidney disease: Secondary | ICD-10-CM | POA: Diagnosis not present

## 2024-09-03 DIAGNOSIS — M109 Gout, unspecified: Secondary | ICD-10-CM | POA: Diagnosis not present

## 2024-09-03 DIAGNOSIS — E785 Hyperlipidemia, unspecified: Secondary | ICD-10-CM | POA: Diagnosis not present

## 2024-09-03 DIAGNOSIS — Z993 Dependence on wheelchair: Secondary | ICD-10-CM | POA: Diagnosis not present

## 2024-09-03 DIAGNOSIS — N184 Chronic kidney disease, stage 4 (severe): Secondary | ICD-10-CM | POA: Diagnosis not present

## 2024-09-04 ENCOUNTER — Telehealth: Payer: Self-pay

## 2024-09-04 DIAGNOSIS — K219 Gastro-esophageal reflux disease without esophagitis: Secondary | ICD-10-CM | POA: Diagnosis not present

## 2024-09-04 DIAGNOSIS — M109 Gout, unspecified: Secondary | ICD-10-CM | POA: Diagnosis not present

## 2024-09-04 DIAGNOSIS — D631 Anemia in chronic kidney disease: Secondary | ICD-10-CM | POA: Diagnosis not present

## 2024-09-04 DIAGNOSIS — Z955 Presence of coronary angioplasty implant and graft: Secondary | ICD-10-CM | POA: Diagnosis not present

## 2024-09-04 DIAGNOSIS — I2510 Atherosclerotic heart disease of native coronary artery without angina pectoris: Secondary | ICD-10-CM | POA: Diagnosis not present

## 2024-09-04 DIAGNOSIS — Z794 Long term (current) use of insulin: Secondary | ICD-10-CM | POA: Diagnosis not present

## 2024-09-04 DIAGNOSIS — E1122 Type 2 diabetes mellitus with diabetic chronic kidney disease: Secondary | ICD-10-CM | POA: Diagnosis not present

## 2024-09-04 DIAGNOSIS — Z89512 Acquired absence of left leg below knee: Secondary | ICD-10-CM | POA: Diagnosis not present

## 2024-09-04 DIAGNOSIS — E1140 Type 2 diabetes mellitus with diabetic neuropathy, unspecified: Secondary | ICD-10-CM | POA: Diagnosis not present

## 2024-09-04 DIAGNOSIS — Z7985 Long-term (current) use of injectable non-insulin antidiabetic drugs: Secondary | ICD-10-CM | POA: Diagnosis not present

## 2024-09-04 DIAGNOSIS — E785 Hyperlipidemia, unspecified: Secondary | ICD-10-CM | POA: Diagnosis not present

## 2024-09-04 DIAGNOSIS — N184 Chronic kidney disease, stage 4 (severe): Secondary | ICD-10-CM | POA: Diagnosis not present

## 2024-09-04 DIAGNOSIS — Z7902 Long term (current) use of antithrombotics/antiplatelets: Secondary | ICD-10-CM | POA: Diagnosis not present

## 2024-09-04 DIAGNOSIS — Z993 Dependence on wheelchair: Secondary | ICD-10-CM | POA: Diagnosis not present

## 2024-09-04 DIAGNOSIS — Z7982 Long term (current) use of aspirin: Secondary | ICD-10-CM | POA: Diagnosis not present

## 2024-09-04 DIAGNOSIS — T8781 Dehiscence of amputation stump: Secondary | ICD-10-CM | POA: Diagnosis not present

## 2024-09-04 DIAGNOSIS — I130 Hypertensive heart and chronic kidney disease with heart failure and stage 1 through stage 4 chronic kidney disease, or unspecified chronic kidney disease: Secondary | ICD-10-CM | POA: Diagnosis not present

## 2024-09-04 DIAGNOSIS — I5030 Unspecified diastolic (congestive) heart failure: Secondary | ICD-10-CM | POA: Diagnosis not present

## 2024-09-04 NOTE — Telephone Encounter (Signed)
 Joshua [name] called [facility] triage line wanting an order to d/c wound vac. Pt is healed enough for wet-to-dry dressing.  Called Joshua [name] back to give the verbal order: D/c wound vac and change to wet-to-dry dressing.  Joshua [name] will see patient today and will call back to discuss what they decided to do based on their assessment of pt's wound today.

## 2024-09-05 ENCOUNTER — Telehealth: Payer: Self-pay

## 2024-09-05 ENCOUNTER — Other Ambulatory Visit (HOSPITAL_COMMUNITY): Payer: Self-pay

## 2024-09-05 NOTE — Telephone Encounter (Signed)
 Home Health/Triage: Jenna @ [facility] called to report during her visit on 09/04/24 she did note the wound was still draining a little bit so she put the wound vac back on and did the bridge also to protect the skin.   She also reports there is a small area that had some yellow stuff and she put santyl  and mupirocin  pm that stating it's healing well. She states she will do the vac change again on Friday and will call us  if there are any changes.

## 2024-09-07 ENCOUNTER — Telehealth: Payer: Self-pay

## 2024-09-07 DIAGNOSIS — Z7982 Long term (current) use of aspirin: Secondary | ICD-10-CM | POA: Diagnosis not present

## 2024-09-07 DIAGNOSIS — Z7902 Long term (current) use of antithrombotics/antiplatelets: Secondary | ICD-10-CM | POA: Diagnosis not present

## 2024-09-07 DIAGNOSIS — I130 Hypertensive heart and chronic kidney disease with heart failure and stage 1 through stage 4 chronic kidney disease, or unspecified chronic kidney disease: Secondary | ICD-10-CM | POA: Diagnosis not present

## 2024-09-07 DIAGNOSIS — E785 Hyperlipidemia, unspecified: Secondary | ICD-10-CM | POA: Diagnosis not present

## 2024-09-07 DIAGNOSIS — E1140 Type 2 diabetes mellitus with diabetic neuropathy, unspecified: Secondary | ICD-10-CM | POA: Diagnosis not present

## 2024-09-07 DIAGNOSIS — Z993 Dependence on wheelchair: Secondary | ICD-10-CM | POA: Diagnosis not present

## 2024-09-07 DIAGNOSIS — E1122 Type 2 diabetes mellitus with diabetic chronic kidney disease: Secondary | ICD-10-CM | POA: Diagnosis not present

## 2024-09-07 DIAGNOSIS — Z794 Long term (current) use of insulin: Secondary | ICD-10-CM | POA: Diagnosis not present

## 2024-09-07 DIAGNOSIS — N184 Chronic kidney disease, stage 4 (severe): Secondary | ICD-10-CM | POA: Diagnosis not present

## 2024-09-07 DIAGNOSIS — Z7985 Long-term (current) use of injectable non-insulin antidiabetic drugs: Secondary | ICD-10-CM | POA: Diagnosis not present

## 2024-09-07 DIAGNOSIS — Z89512 Acquired absence of left leg below knee: Secondary | ICD-10-CM | POA: Diagnosis not present

## 2024-09-07 DIAGNOSIS — M109 Gout, unspecified: Secondary | ICD-10-CM | POA: Diagnosis not present

## 2024-09-07 DIAGNOSIS — D631 Anemia in chronic kidney disease: Secondary | ICD-10-CM | POA: Diagnosis not present

## 2024-09-07 DIAGNOSIS — I5030 Unspecified diastolic (congestive) heart failure: Secondary | ICD-10-CM | POA: Diagnosis not present

## 2024-09-07 DIAGNOSIS — T8781 Dehiscence of amputation stump: Secondary | ICD-10-CM | POA: Diagnosis not present

## 2024-09-07 DIAGNOSIS — Z955 Presence of coronary angioplasty implant and graft: Secondary | ICD-10-CM | POA: Diagnosis not present

## 2024-09-07 DIAGNOSIS — I2510 Atherosclerotic heart disease of native coronary artery without angina pectoris: Secondary | ICD-10-CM | POA: Diagnosis not present

## 2024-09-07 DIAGNOSIS — K219 Gastro-esophageal reflux disease without esophagitis: Secondary | ICD-10-CM | POA: Diagnosis not present

## 2024-09-07 NOTE — Telephone Encounter (Signed)
 Jenna, RN [phone] [facility] called asking for PRN order for today, 09/07/24, to change patient's wound vac.  Verbal order given for one prn visit on 09/07/24 to change pt's wound vac.

## 2024-09-08 ENCOUNTER — Other Ambulatory Visit (HOSPITAL_COMMUNITY): Payer: Self-pay

## 2024-09-08 ENCOUNTER — Other Ambulatory Visit: Payer: Self-pay | Admitting: Internal Medicine

## 2024-09-08 DIAGNOSIS — Z794 Long term (current) use of insulin: Secondary | ICD-10-CM

## 2024-09-10 ENCOUNTER — Other Ambulatory Visit (HOSPITAL_COMMUNITY): Payer: Self-pay

## 2024-09-10 ENCOUNTER — Telehealth: Payer: Self-pay

## 2024-09-10 DIAGNOSIS — Z7902 Long term (current) use of antithrombotics/antiplatelets: Secondary | ICD-10-CM | POA: Diagnosis not present

## 2024-09-10 DIAGNOSIS — Z794 Long term (current) use of insulin: Secondary | ICD-10-CM | POA: Diagnosis not present

## 2024-09-10 DIAGNOSIS — N184 Chronic kidney disease, stage 4 (severe): Secondary | ICD-10-CM | POA: Diagnosis not present

## 2024-09-10 DIAGNOSIS — Z7985 Long-term (current) use of injectable non-insulin antidiabetic drugs: Secondary | ICD-10-CM | POA: Diagnosis not present

## 2024-09-10 DIAGNOSIS — I5030 Unspecified diastolic (congestive) heart failure: Secondary | ICD-10-CM | POA: Diagnosis not present

## 2024-09-10 DIAGNOSIS — Z7982 Long term (current) use of aspirin: Secondary | ICD-10-CM | POA: Diagnosis not present

## 2024-09-10 DIAGNOSIS — Z955 Presence of coronary angioplasty implant and graft: Secondary | ICD-10-CM | POA: Diagnosis not present

## 2024-09-10 DIAGNOSIS — Z89512 Acquired absence of left leg below knee: Secondary | ICD-10-CM | POA: Diagnosis not present

## 2024-09-10 DIAGNOSIS — T8781 Dehiscence of amputation stump: Secondary | ICD-10-CM | POA: Diagnosis not present

## 2024-09-10 DIAGNOSIS — E1122 Type 2 diabetes mellitus with diabetic chronic kidney disease: Secondary | ICD-10-CM | POA: Diagnosis not present

## 2024-09-10 DIAGNOSIS — I130 Hypertensive heart and chronic kidney disease with heart failure and stage 1 through stage 4 chronic kidney disease, or unspecified chronic kidney disease: Secondary | ICD-10-CM | POA: Diagnosis not present

## 2024-09-10 DIAGNOSIS — K219 Gastro-esophageal reflux disease without esophagitis: Secondary | ICD-10-CM | POA: Diagnosis not present

## 2024-09-10 DIAGNOSIS — D631 Anemia in chronic kidney disease: Secondary | ICD-10-CM | POA: Diagnosis not present

## 2024-09-10 DIAGNOSIS — E1140 Type 2 diabetes mellitus with diabetic neuropathy, unspecified: Secondary | ICD-10-CM | POA: Diagnosis not present

## 2024-09-10 DIAGNOSIS — Z993 Dependence on wheelchair: Secondary | ICD-10-CM | POA: Diagnosis not present

## 2024-09-10 DIAGNOSIS — I2510 Atherosclerotic heart disease of native coronary artery without angina pectoris: Secondary | ICD-10-CM | POA: Diagnosis not present

## 2024-09-10 DIAGNOSIS — E785 Hyperlipidemia, unspecified: Secondary | ICD-10-CM | POA: Diagnosis not present

## 2024-09-10 DIAGNOSIS — M109 Gout, unspecified: Secondary | ICD-10-CM | POA: Diagnosis not present

## 2024-09-10 MED ORDER — FUROSEMIDE 40 MG PO TABS
40.0000 mg | ORAL_TABLET | Freq: Two times a day (BID) | ORAL | 0 refills | Status: AC
  Filled 2024-09-10 – 2024-09-14 (×3): qty 200, 100d supply, fill #0

## 2024-09-10 NOTE — Telephone Encounter (Signed)
 Contact patient for follow up appointment

## 2024-09-10 NOTE — Telephone Encounter (Signed)
 Lmx1,[id number] to schedule follow up with Dr. Trixie.

## 2024-09-11 ENCOUNTER — Encounter (HOSPITAL_COMMUNITY): Payer: Self-pay

## 2024-09-11 ENCOUNTER — Other Ambulatory Visit (HOSPITAL_COMMUNITY): Payer: Self-pay

## 2024-09-11 ENCOUNTER — Other Ambulatory Visit (INDEPENDENT_AMBULATORY_CARE_PROVIDER_SITE_OTHER): Payer: Self-pay

## 2024-09-11 ENCOUNTER — Other Ambulatory Visit: Payer: Self-pay

## 2024-09-11 DIAGNOSIS — T8189XA Other complications of procedures, not elsewhere classified, initial encounter: Secondary | ICD-10-CM | POA: Diagnosis not present

## 2024-09-12 ENCOUNTER — Other Ambulatory Visit: Payer: Self-pay

## 2024-09-12 ENCOUNTER — Other Ambulatory Visit (HOSPITAL_COMMUNITY): Payer: Self-pay

## 2024-09-12 MED ORDER — FAMOTIDINE 20 MG PO TABS
20.0000 mg | ORAL_TABLET | Freq: Every evening | ORAL | 0 refills | Status: DC | PRN
  Filled 2024-09-12: qty 90, 90d supply, fill #0

## 2024-09-12 MED ORDER — FREESTYLE LIBRE 3 SENSOR MISC
1 refills | Status: DC
  Filled 2024-09-12: qty 6, 84d supply, fill #0

## 2024-09-13 ENCOUNTER — Other Ambulatory Visit (HOSPITAL_COMMUNITY): Payer: Self-pay

## 2024-09-13 ENCOUNTER — Other Ambulatory Visit: Payer: Self-pay

## 2024-09-13 ENCOUNTER — Other Ambulatory Visit (HOSPITAL_BASED_OUTPATIENT_CLINIC_OR_DEPARTMENT_OTHER): Payer: Self-pay

## 2024-09-13 DIAGNOSIS — K219 Gastro-esophageal reflux disease without esophagitis: Secondary | ICD-10-CM | POA: Diagnosis not present

## 2024-09-13 DIAGNOSIS — Z89512 Acquired absence of left leg below knee: Secondary | ICD-10-CM | POA: Diagnosis not present

## 2024-09-13 DIAGNOSIS — D631 Anemia in chronic kidney disease: Secondary | ICD-10-CM | POA: Diagnosis not present

## 2024-09-13 DIAGNOSIS — Z7985 Long-term (current) use of injectable non-insulin antidiabetic drugs: Secondary | ICD-10-CM | POA: Diagnosis not present

## 2024-09-13 DIAGNOSIS — Z993 Dependence on wheelchair: Secondary | ICD-10-CM | POA: Diagnosis not present

## 2024-09-13 DIAGNOSIS — Z7982 Long term (current) use of aspirin: Secondary | ICD-10-CM | POA: Diagnosis not present

## 2024-09-13 DIAGNOSIS — N184 Chronic kidney disease, stage 4 (severe): Secondary | ICD-10-CM | POA: Diagnosis not present

## 2024-09-13 DIAGNOSIS — M109 Gout, unspecified: Secondary | ICD-10-CM | POA: Diagnosis not present

## 2024-09-13 DIAGNOSIS — E1140 Type 2 diabetes mellitus with diabetic neuropathy, unspecified: Secondary | ICD-10-CM | POA: Diagnosis not present

## 2024-09-13 DIAGNOSIS — I5030 Unspecified diastolic (congestive) heart failure: Secondary | ICD-10-CM | POA: Diagnosis not present

## 2024-09-13 DIAGNOSIS — I2510 Atherosclerotic heart disease of native coronary artery without angina pectoris: Secondary | ICD-10-CM | POA: Diagnosis not present

## 2024-09-13 DIAGNOSIS — Z794 Long term (current) use of insulin: Secondary | ICD-10-CM | POA: Diagnosis not present

## 2024-09-13 DIAGNOSIS — I130 Hypertensive heart and chronic kidney disease with heart failure and stage 1 through stage 4 chronic kidney disease, or unspecified chronic kidney disease: Secondary | ICD-10-CM | POA: Diagnosis not present

## 2024-09-13 DIAGNOSIS — Z955 Presence of coronary angioplasty implant and graft: Secondary | ICD-10-CM | POA: Diagnosis not present

## 2024-09-13 DIAGNOSIS — T8781 Dehiscence of amputation stump: Secondary | ICD-10-CM | POA: Diagnosis not present

## 2024-09-13 DIAGNOSIS — Z7902 Long term (current) use of antithrombotics/antiplatelets: Secondary | ICD-10-CM | POA: Diagnosis not present

## 2024-09-13 DIAGNOSIS — E1122 Type 2 diabetes mellitus with diabetic chronic kidney disease: Secondary | ICD-10-CM | POA: Diagnosis not present

## 2024-09-13 DIAGNOSIS — E785 Hyperlipidemia, unspecified: Secondary | ICD-10-CM | POA: Diagnosis not present

## 2024-09-14 ENCOUNTER — Other Ambulatory Visit (HOSPITAL_COMMUNITY): Payer: Self-pay

## 2024-09-14 ENCOUNTER — Other Ambulatory Visit: Payer: Self-pay

## 2024-09-17 DIAGNOSIS — M109 Gout, unspecified: Secondary | ICD-10-CM | POA: Diagnosis not present

## 2024-09-17 DIAGNOSIS — I130 Hypertensive heart and chronic kidney disease with heart failure and stage 1 through stage 4 chronic kidney disease, or unspecified chronic kidney disease: Secondary | ICD-10-CM | POA: Diagnosis not present

## 2024-09-17 DIAGNOSIS — K219 Gastro-esophageal reflux disease without esophagitis: Secondary | ICD-10-CM | POA: Diagnosis not present

## 2024-09-17 DIAGNOSIS — T8781 Dehiscence of amputation stump: Secondary | ICD-10-CM | POA: Diagnosis not present

## 2024-09-17 DIAGNOSIS — Z7982 Long term (current) use of aspirin: Secondary | ICD-10-CM | POA: Diagnosis not present

## 2024-09-17 DIAGNOSIS — Z7985 Long-term (current) use of injectable non-insulin antidiabetic drugs: Secondary | ICD-10-CM | POA: Diagnosis not present

## 2024-09-17 DIAGNOSIS — Z794 Long term (current) use of insulin: Secondary | ICD-10-CM | POA: Diagnosis not present

## 2024-09-17 DIAGNOSIS — Z7902 Long term (current) use of antithrombotics/antiplatelets: Secondary | ICD-10-CM | POA: Diagnosis not present

## 2024-09-17 DIAGNOSIS — D631 Anemia in chronic kidney disease: Secondary | ICD-10-CM | POA: Diagnosis not present

## 2024-09-17 DIAGNOSIS — E785 Hyperlipidemia, unspecified: Secondary | ICD-10-CM | POA: Diagnosis not present

## 2024-09-17 DIAGNOSIS — E1140 Type 2 diabetes mellitus with diabetic neuropathy, unspecified: Secondary | ICD-10-CM | POA: Diagnosis not present

## 2024-09-17 DIAGNOSIS — N184 Chronic kidney disease, stage 4 (severe): Secondary | ICD-10-CM | POA: Diagnosis not present

## 2024-09-17 DIAGNOSIS — I2510 Atherosclerotic heart disease of native coronary artery without angina pectoris: Secondary | ICD-10-CM | POA: Diagnosis not present

## 2024-09-17 DIAGNOSIS — E1122 Type 2 diabetes mellitus with diabetic chronic kidney disease: Secondary | ICD-10-CM | POA: Diagnosis not present

## 2024-09-17 DIAGNOSIS — Z993 Dependence on wheelchair: Secondary | ICD-10-CM | POA: Diagnosis not present

## 2024-09-17 DIAGNOSIS — Z89512 Acquired absence of left leg below knee: Secondary | ICD-10-CM | POA: Diagnosis not present

## 2024-09-17 DIAGNOSIS — I5030 Unspecified diastolic (congestive) heart failure: Secondary | ICD-10-CM | POA: Diagnosis not present

## 2024-09-17 DIAGNOSIS — Z955 Presence of coronary angioplasty implant and graft: Secondary | ICD-10-CM | POA: Diagnosis not present

## 2024-09-18 ENCOUNTER — Other Ambulatory Visit (HOSPITAL_COMMUNITY): Payer: Self-pay

## 2024-09-18 DIAGNOSIS — Z993 Dependence on wheelchair: Secondary | ICD-10-CM | POA: Diagnosis not present

## 2024-09-18 DIAGNOSIS — Z7982 Long term (current) use of aspirin: Secondary | ICD-10-CM | POA: Diagnosis not present

## 2024-09-18 DIAGNOSIS — N184 Chronic kidney disease, stage 4 (severe): Secondary | ICD-10-CM | POA: Diagnosis not present

## 2024-09-18 DIAGNOSIS — Z7902 Long term (current) use of antithrombotics/antiplatelets: Secondary | ICD-10-CM | POA: Diagnosis not present

## 2024-09-18 DIAGNOSIS — Z7985 Long-term (current) use of injectable non-insulin antidiabetic drugs: Secondary | ICD-10-CM | POA: Diagnosis not present

## 2024-09-18 DIAGNOSIS — I2510 Atherosclerotic heart disease of native coronary artery without angina pectoris: Secondary | ICD-10-CM | POA: Diagnosis not present

## 2024-09-18 DIAGNOSIS — M109 Gout, unspecified: Secondary | ICD-10-CM | POA: Diagnosis not present

## 2024-09-18 DIAGNOSIS — Z794 Long term (current) use of insulin: Secondary | ICD-10-CM | POA: Diagnosis not present

## 2024-09-18 DIAGNOSIS — E1122 Type 2 diabetes mellitus with diabetic chronic kidney disease: Secondary | ICD-10-CM | POA: Diagnosis not present

## 2024-09-18 DIAGNOSIS — K219 Gastro-esophageal reflux disease without esophagitis: Secondary | ICD-10-CM | POA: Diagnosis not present

## 2024-09-18 DIAGNOSIS — E785 Hyperlipidemia, unspecified: Secondary | ICD-10-CM | POA: Diagnosis not present

## 2024-09-18 DIAGNOSIS — Z955 Presence of coronary angioplasty implant and graft: Secondary | ICD-10-CM | POA: Diagnosis not present

## 2024-09-18 DIAGNOSIS — Z89512 Acquired absence of left leg below knee: Secondary | ICD-10-CM | POA: Diagnosis not present

## 2024-09-18 DIAGNOSIS — E1140 Type 2 diabetes mellitus with diabetic neuropathy, unspecified: Secondary | ICD-10-CM | POA: Diagnosis not present

## 2024-09-18 DIAGNOSIS — D631 Anemia in chronic kidney disease: Secondary | ICD-10-CM | POA: Diagnosis not present

## 2024-09-18 DIAGNOSIS — I130 Hypertensive heart and chronic kidney disease with heart failure and stage 1 through stage 4 chronic kidney disease, or unspecified chronic kidney disease: Secondary | ICD-10-CM | POA: Diagnosis not present

## 2024-09-18 DIAGNOSIS — I5030 Unspecified diastolic (congestive) heart failure: Secondary | ICD-10-CM | POA: Diagnosis not present

## 2024-09-18 DIAGNOSIS — T8781 Dehiscence of amputation stump: Secondary | ICD-10-CM | POA: Diagnosis not present

## 2024-09-20 ENCOUNTER — Encounter: Payer: Self-pay | Admitting: Vascular Surgery

## 2024-09-20 ENCOUNTER — Encounter: Payer: Self-pay | Admitting: Physician Assistant

## 2024-09-20 ENCOUNTER — Telehealth: Payer: Self-pay

## 2024-09-20 ENCOUNTER — Ambulatory Visit: Attending: Vascular Surgery | Admitting: Physician Assistant

## 2024-09-20 VITALS — BP 148/82 | HR 94 | Temp 98.1°F

## 2024-09-20 DIAGNOSIS — Z89512 Acquired absence of left leg below knee: Secondary | ICD-10-CM

## 2024-09-20 DIAGNOSIS — R2689 Other abnormalities of gait and mobility: Secondary | ICD-10-CM | POA: Diagnosis not present

## 2024-09-20 DIAGNOSIS — S81802D Unspecified open wound, left lower leg, subsequent encounter: Secondary | ICD-10-CM | POA: Diagnosis not present

## 2024-09-20 DIAGNOSIS — S81802A Unspecified open wound, left lower leg, initial encounter: Secondary | ICD-10-CM

## 2024-09-20 DIAGNOSIS — M869 Osteomyelitis, unspecified: Secondary | ICD-10-CM | POA: Diagnosis not present

## 2024-09-20 NOTE — Telephone Encounter (Signed)
 [facility] contacted with orders to visit patient 1-2x a week for wet to dry dressing changes and wound checks.  Jenna verbalized understanding of above orders and she asked for the orders to be faxed to [phone].  Fax sent. 09/20/2024 at 2:14 pm
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
## 2024-09-26 ENCOUNTER — Other Ambulatory Visit (HOSPITAL_COMMUNITY): Payer: Self-pay

## 2024-09-26 DIAGNOSIS — Z89512 Acquired absence of left leg below knee: Secondary | ICD-10-CM | POA: Diagnosis not present

## 2024-09-26 DIAGNOSIS — N183 Chronic kidney disease, stage 3 unspecified: Secondary | ICD-10-CM | POA: Diagnosis not present

## 2024-09-26 DIAGNOSIS — I129 Hypertensive chronic kidney disease with stage 1 through stage 4 chronic kidney disease, or unspecified chronic kidney disease: Secondary | ICD-10-CM | POA: Diagnosis not present

## 2024-09-26 DIAGNOSIS — N2581 Secondary hyperparathyroidism of renal origin: Secondary | ICD-10-CM | POA: Diagnosis not present

## 2024-09-26 DIAGNOSIS — N184 Chronic kidney disease, stage 4 (severe): Secondary | ICD-10-CM | POA: Diagnosis not present

## 2024-09-26 DIAGNOSIS — D631 Anemia in chronic kidney disease: Secondary | ICD-10-CM | POA: Diagnosis not present

## 2024-09-27 ENCOUNTER — Other Ambulatory Visit (HOSPITAL_COMMUNITY): Payer: Self-pay

## 2024-09-27 ENCOUNTER — Other Ambulatory Visit: Payer: Self-pay

## 2024-09-27 MED ORDER — MUPIROCIN 2 % EX OINT
TOPICAL_OINTMENT | CUTANEOUS | 0 refills | Status: DC
  Filled 2024-09-27: qty 22, 30d supply, fill #0

## 2024-10-01 DIAGNOSIS — T8189XA Other complications of procedures, not elsewhere classified, initial encounter: Secondary | ICD-10-CM | POA: Diagnosis not present

## 2024-10-03 ENCOUNTER — Telehealth: Payer: Self-pay
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
## 2024-10-05 ENCOUNTER — Encounter: Payer: Self-pay | Admitting: Internal Medicine

## 2024-10-05 ENCOUNTER — Other Ambulatory Visit (HOSPITAL_COMMUNITY): Payer: Self-pay

## 2024-10-05 ENCOUNTER — Ambulatory Visit (INDEPENDENT_AMBULATORY_CARE_PROVIDER_SITE_OTHER): Admitting: Internal Medicine

## 2024-10-05 VITALS — BP 130/80 | HR 92 | Ht 69.0 in | Wt 234.0 lb

## 2024-10-05 DIAGNOSIS — E66813 Obesity, class 3: Secondary | ICD-10-CM

## 2024-10-05 DIAGNOSIS — E1142 Type 2 diabetes mellitus with diabetic polyneuropathy: Secondary | ICD-10-CM

## 2024-10-05 DIAGNOSIS — E785 Hyperlipidemia, unspecified: Secondary | ICD-10-CM | POA: Diagnosis not present

## 2024-10-05 DIAGNOSIS — Z794 Long term (current) use of insulin: Secondary | ICD-10-CM | POA: Diagnosis not present

## 2024-10-05 MED ORDER — OZEMPIC (1 MG/DOSE) 4 MG/3ML ~~LOC~~ SOPN
1.0000 mg | PEN_INJECTOR | SUBCUTANEOUS | 3 refills | Status: AC
  Filled 2024-10-05: qty 9, 84d supply, fill #0
  Filled 2024-12-15: qty 9, 84d supply, fill #1

## 2024-10-05 MED ORDER — LANTUS SOLOSTAR 100 UNIT/ML ~~LOC~~ SOPN
25.0000 [IU] | PEN_INJECTOR | Freq: Every day | SUBCUTANEOUS | 3 refills | Status: AC
  Filled 2024-10-05: qty 30, 120d supply, fill #0

## 2024-10-05 MED ORDER — FREESTYLE LIBRE 3 PLUS SENSOR MISC
1.0000 | 3 refills | Status: AC
  Filled 2024-10-05: qty 6, 84d supply, fill #0
  Filled 2024-11-04 – 2024-11-14 (×2): qty 6, 90d supply, fill #0

## 2024-10-05 MED ORDER — INSULIN LISPRO (1 UNIT DIAL) 100 UNIT/ML (KWIKPEN)
5.0000 [IU] | PEN_INJECTOR | Freq: Three times a day (TID) | SUBCUTANEOUS | 3 refills | Status: AC
  Filled 2024-10-05: qty 30, 72d supply, fill #0

## 2024-10-05 NOTE — Patient Instructions (Addendum)
 Please decrease: - Lantus  20 units in am   Use: - Humalog  6-8 (10) units 15 min before meals but use 4 units 15 min before a snack at night - Humalog  Sliding scale: 121 - 150: 1 unit  151 - 200: 2 units  201 - 250: 3 units   251 - 300: 5 units  >300: 7 units  - Ozempic  1 mg weekly  Please return in 3-4 months.

## 2024-10-05 NOTE — Progress Notes (Signed)
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 status.  He did see the nutritionist in the past. -He was previously going to the weight management clinic, but not anymore -He lost 11 pounds before the last 2 visits combined.  He could not be weighed today since he is in a wheelchair.  Lela Fendt, MD PhD [facility]:

## 2024-10-08 ENCOUNTER — Other Ambulatory Visit: Payer: Self-pay

## 2024-10-08 ENCOUNTER — Other Ambulatory Visit (HOSPITAL_COMMUNITY): Payer: Self-pay

## 2024-10-10 NOTE — Progress Notes (Unsigned)
 POST OPERATIVE OFFICE NOTE    CC:  F/u for surgery  HPI:  This is a 68 y.o. male who is s/p left BKA on 05/31/2024 by Dr. Magda and on debridement of left BKA on 07/23/2024 by Dr. Lanis.    He was seen on 09/20/2024 and at that time, his wound was healing nicely and his vac was discontinued and wet to dry dressing started.  He was scheduled to return for wound check.   ABI on the right 05/27/2024 was 1.16.  Pt returns today for follow up.  Pt states he is doing well.  His wound is healing.  He states he gets occasional pain on the top of the right foot.  He does not have any wounds on the right foot.  He has hx of right TMA by Dr. Harden years ago. He is diabetic.   Allergies  Allergen Reactions   Amlodipine  Besylate     Other Reaction(s): leg cramps   Chlorthalidone Other (See Comments)    Causes dehydration, kidneys shut down  Other Reaction(s): Kidney Failure    Current Outpatient Medications  Medication Sig Dispense Refill   acetaminophen  (TYLENOL ) 325 MG tablet Take 2 tablets (650 mg total) by mouth every 6 (six) hours as needed. 240 tablet 1   allopurinol  (ZYLOPRIM ) 100 MG tablet Take 0.5 tablets (50 mg total) by mouth daily. (Patient taking differently: Take 100 mg by mouth daily.)     aspirin  EC 81 MG tablet Take 1 tablet (81 mg total) by mouth daily. (Patient taking differently: Take 81 mg by mouth in the morning.) 30 tablet 0   Blood Glucose Monitoring Suppl (BLOOD GLUCOSE MONITOR SYSTEM) w/Device KIT Use in the morning, at noon, and at bedtime. 1 kit 0   Continuous Glucose Receiver (FREESTYLE LIBRE 3 READER) DEVI Use as advised 1 each 0   Continuous Glucose Sensor (FREESTYLE LIBRE 3 PLUS SENSOR) MISC 1 each by Does not apply route every 14 (fourteen) days. 6 each 3   DULoxetine  (CYMBALTA ) 20 MG capsule Take 1 capsule (20 mg total) by mouth 2 (two) times daily. 200 capsule 1   famotidine  (PEPCID ) 20 MG tablet Take 20 mg by mouth at bedtime.     furosemide  (LASIX ) 40 MG  tablet Take 1 tablet (40 mg total) by mouth 2 (two) times daily. 200 tablet 0   gabapentin  (NEURONTIN ) 100 MG capsule Take 1 capsule (100 mg total) by mouth at bedtime. 100 capsule 1   insulin  glargine (LANTUS  SOLOSTAR) 100 UNIT/ML Solostar Pen Inject 25 Units into the skin daily. 30 mL 3   insulin  lispro (HUMALOG  KWIKPEN) 100 UNIT/ML KwikPen Inject 5-14 Units into the skin 3 (three) times daily with meals. Inject 30 mL 3   Insulin  Pen Needle 32G X 4 MM MISC Use with Lantus  100 each 0   isosorbide  mononitrate (IMDUR ) 60 MG 24 hr tablet Take 2 tablets by mouth in the morning AND 1 tablet every evening. 270 tablet 3   latanoprost  (XALATAN ) 0.005 % ophthalmic solution Place 1 drop into both eyes at bedtime. 7.5 mL 1   [Paused] losartan  (COZAAR ) 25 MG tablet Take 25 mg by mouth daily.     metoprolol  succinate (TOPROL -XL) 100 MG 24 hr tablet Take 1 tablet (100 mg total) by mouth daily. 90 tablet 1   mupirocin  ointment (BACTROBAN ) 2 % Apply 2-3 times a week applying on woundbed prior to placing wound vac daily. 22 g 0   nitroGLYCERIN  (NITROSTAT ) 0.4 MG SL tablet Place 1  tablet (0.4 mg total) under the tongue every 5 (five) minutes as needed for chest pain. 25 tablet 3   oxyCODONE  (OXY IR/ROXICODONE ) 5 MG immediate release tablet Take 1 tablet (5 mg total) by mouth every 8 (eight) hours as needed for up to 5 days for severe pain (pain score 7-10). 15 tablet 0   pantoprazole  (PROTONIX ) 40 MG tablet Take 40 mg by mouth 2 (two) times daily before a meal.     rosuvastatin  (CRESTOR ) 10 MG tablet Take 4 tablets (40 mg total) by mouth daily.     Semaglutide , 1 MG/DOSE, (OZEMPIC , 1 MG/DOSE,) 4 MG/3ML SOPN Inject 1 mg into the skin once a week. 9 mL 3   senna-docusate (SENOKOT-S) 8.6-50 MG tablet Take 1-2 tablets by mouth 2 (two) times daily between meals as needed for mild constipation or moderate constipation.     ticagrelor  (BRILINTA ) 90 MG TABS tablet Take 1 tablet (90 mg total) by mouth 2 (two) times daily. 180  tablet 3   No current facility-administered medications for this visit.     ROS:  See HPI  Physical Exam:  Today's Vitals   10/11/24 1002  BP: 139/80  Pulse: 86  Temp: 98.1 F (36.7 C)  TempSrc: Temporal  Weight: 234 lb (106.1 kg)  PainSc: 0-No pain   Body mass index is 34.56 kg/m.   Incision:       Extremities:  brisk doppler flow right DP/PT/peroneal.  No ulcerations     Assessment/Plan:  This is a 68 y.o. male who is s/p: left BKA on 05/31/2024 by Dr. Magda and on debridement of left BKA on 07/23/2024 by Dr. Lanis.    -pt left BKA is healing.  He has an area of hyper granulation tissue on the medial side that I swabbed with silver  nitrate.   -I did give him rx for [employer] for prosthesis.   -as far as his right foot goes, he has brisk doppler flow right foot.  We will have him return in 6 months with right ABI.   -he knows to call sooner if he develops any wounds on either leg he is concerned about.    The patient has a left Below Knee Amputation. The patient is well motivated to return to their prior functional status by utilizing a prosthesis to perform ADL's and maintain a healthy lifestyle. The patient has the physical and cognitive capacity to function with a prosthesis.   Functional Level: K2 Limited Community Ambulator: Has the ability or potential for ambulation and to traverse low environmental barriers such as curbs, stairs or uneven surfaces  Residual Limb History: The skin condition of the residual limb is healing nicely with an area of hypergranulation tissue that was swabbed with silver  nitrate. The patient will continue to monitor the skin of the residual limb and follow hygiene instructions.  The patient is experiencing no pain related to amputation  Prosthetic Prescription Plan: Counseling and education regarding prosthetic management will be provided to the patient via a certified prosthetist. A multi-discipline team, including physical therapy,  will manage the prosthetic fabrication, fitting and prosthetic gait training.    Lucie Apt, [facility] [phone]  Clinic MD:  Lanis

## 2024-10-11 ENCOUNTER — Ambulatory Visit: Attending: Vascular Surgery | Admitting: Physician Assistant

## 2024-10-11 ENCOUNTER — Other Ambulatory Visit (HOSPITAL_COMMUNITY): Payer: Self-pay

## 2024-10-11 VITALS — BP 139/80 | HR 86 | Temp 98.1°F | Wt 234.0 lb

## 2024-10-11 DIAGNOSIS — Z89512 Acquired absence of left leg below knee: Secondary | ICD-10-CM

## 2024-10-15 ENCOUNTER — Other Ambulatory Visit (HOSPITAL_COMMUNITY): Payer: Self-pay

## 2024-10-18 ENCOUNTER — Other Ambulatory Visit: Payer: Self-pay | Admitting: *Deleted

## 2024-10-18 DIAGNOSIS — I739 Peripheral vascular disease, unspecified: Secondary | ICD-10-CM

## 2024-10-22 ENCOUNTER — Other Ambulatory Visit (HOSPITAL_COMMUNITY): Payer: Self-pay

## 2024-10-22 ENCOUNTER — Other Ambulatory Visit: Payer: Self-pay

## 2024-10-23 ENCOUNTER — Other Ambulatory Visit (HOSPITAL_COMMUNITY): Payer: Self-pay

## 2024-10-23 ENCOUNTER — Other Ambulatory Visit: Payer: Self-pay

## 2024-10-23 MED ORDER — ISOSORBIDE MONONITRATE ER 60 MG PO TB24
ORAL_TABLET | ORAL | 0 refills | Status: DC
  Filled 2024-10-23: qty 90, 30d supply, fill #0

## 2024-10-24 ENCOUNTER — Other Ambulatory Visit (HOSPITAL_COMMUNITY): Payer: Self-pay

## 2024-10-24 MED ORDER — ALLOPURINOL 100 MG PO TABS
50.0000 mg | ORAL_TABLET | Freq: Every day | ORAL | 1 refills | Status: AC
  Filled 2024-10-24: qty 50, 100d supply, fill #0
  Filled 2025-01-12: qty 50, 100d supply, fill #1

## 2024-10-24 MED ORDER — ISOSORBIDE MONONITRATE ER 60 MG PO TB24: ORAL_TABLET | ORAL | 1 refills | Status: DC

## 2024-10-24 MED ORDER — ROSUVASTATIN CALCIUM 10 MG PO TABS
10.0000 mg | ORAL_TABLET | Freq: Every day | ORAL | 1 refills | Status: AC
  Filled 2024-10-24: qty 90, 90d supply, fill #0

## 2024-10-25 ENCOUNTER — Other Ambulatory Visit: Payer: Self-pay

## 2024-11-05 ENCOUNTER — Other Ambulatory Visit (HOSPITAL_COMMUNITY): Payer: Self-pay

## 2024-11-05 ENCOUNTER — Encounter: Payer: Self-pay | Admitting: Physician Assistant

## 2024-11-14 ENCOUNTER — Other Ambulatory Visit: Payer: Self-pay

## 2024-11-14 ENCOUNTER — Other Ambulatory Visit (HOSPITAL_COMMUNITY): Payer: Self-pay

## 2024-11-15 ENCOUNTER — Other Ambulatory Visit: Payer: Self-pay

## 2024-11-15 MED ORDER — TICAGRELOR 90 MG PO TABS
90.0000 mg | ORAL_TABLET | Freq: Two times a day (BID) | ORAL | 1 refills | Status: AC
  Filled 2024-11-27: qty 180, 90d supply, fill #0

## 2024-11-15 MED ORDER — SENNA-DOCUSATE SODIUM 8.6-50 MG PO TABS
8.6000 | ORAL_TABLET | Freq: Two times a day (BID) | ORAL | 0 refills | Status: AC | PRN
  Filled 2024-11-15: qty 30, 15d supply, fill #0
  Filled 2024-12-18: qty 30, 7d supply, fill #0
  Filled 2025-01-12: qty 30, 5d supply, fill #0

## 2024-11-19 ENCOUNTER — Other Ambulatory Visit: Payer: Self-pay

## 2024-11-19 ENCOUNTER — Other Ambulatory Visit (HOSPITAL_COMMUNITY): Payer: Self-pay

## 2024-11-19 MED ORDER — GABAPENTIN 100 MG PO CAPS
100.0000 mg | ORAL_CAPSULE | Freq: Every day | ORAL | 1 refills | Status: AC
  Filled 2024-11-19: qty 100, 100d supply, fill #0

## 2024-11-19 MED ORDER — FAMOTIDINE 20 MG PO TABS
20.0000 mg | ORAL_TABLET | Freq: Every evening | ORAL | 0 refills | Status: AC
  Filled 2024-12-18: qty 90, 90d supply, fill #0

## 2024-11-19 MED ORDER — PANTOPRAZOLE SODIUM 40 MG PO TBEC
40.0000 mg | DELAYED_RELEASE_TABLET | Freq: Two times a day (BID) | ORAL | 1 refills | Status: AC
  Filled 2024-11-19: qty 200, 100d supply, fill #0

## 2024-11-19 MED ORDER — DULOXETINE HCL 20 MG PO CPEP
20.0000 mg | ORAL_CAPSULE | Freq: Two times a day (BID) | ORAL | 1 refills | Status: AC
  Filled 2024-11-19: qty 200, 100d supply, fill #0

## 2024-11-19 MED ORDER — FUROSEMIDE 40 MG PO TABS
40.0000 mg | ORAL_TABLET | Freq: Every day | ORAL | 1 refills | Status: AC
  Filled 2024-11-19: qty 100, 100d supply, fill #0

## 2024-11-19 MED ORDER — METOPROLOL SUCCINATE ER 100 MG PO TB24
100.0000 mg | ORAL_TABLET | Freq: Every day | ORAL | 1 refills | Status: AC
  Filled 2024-11-19: qty 100, 100d supply, fill #0

## 2024-11-20 ENCOUNTER — Other Ambulatory Visit: Payer: Self-pay

## 2024-11-20 ENCOUNTER — Other Ambulatory Visit (HOSPITAL_COMMUNITY): Payer: Self-pay

## 2024-11-27 ENCOUNTER — Other Ambulatory Visit (HOSPITAL_COMMUNITY): Payer: Self-pay

## 2024-11-29 ENCOUNTER — Other Ambulatory Visit (HOSPITAL_COMMUNITY): Payer: Self-pay

## 2024-11-29 ENCOUNTER — Other Ambulatory Visit: Payer: Self-pay

## 2024-11-29 MED ORDER — ISOSORBIDE MONONITRATE ER 60 MG PO TB24
ORAL_TABLET | ORAL | 1 refills | Status: AC
  Filled 2024-11-29: qty 90, 30d supply, fill #0
  Filled 2025-01-12: qty 90, 30d supply, fill #1

## 2024-12-17 ENCOUNTER — Other Ambulatory Visit: Payer: Self-pay

## 2024-12-18 ENCOUNTER — Other Ambulatory Visit (HOSPITAL_COMMUNITY): Payer: Self-pay

## 2024-12-19 ENCOUNTER — Other Ambulatory Visit: Payer: Self-pay

## 2024-12-20 ENCOUNTER — Other Ambulatory Visit: Payer: Self-pay

## 2024-12-20 ENCOUNTER — Other Ambulatory Visit: Payer: Self-pay | Admitting: Gastroenterology

## 2024-12-20 ENCOUNTER — Encounter: Payer: Self-pay | Admitting: Pharmacist

## 2024-12-20 ENCOUNTER — Telehealth (HOSPITAL_BASED_OUTPATIENT_CLINIC_OR_DEPARTMENT_OTHER): Payer: Self-pay

## 2024-12-20 NOTE — Telephone Encounter (Signed)
"  ° °  Pre-operative Risk Assessment    Patient Name: Joshua [name]  DOB: [birth year] MRN: [id number]   Date of last office visit: 05/23/24  with cleaver Date of next office visit: NA  Request for Surgical Clearance    Procedure:  colonoscopy   Date of Surgery:  Clearance 01/29/25                                 Surgeon:  Dr. Dianna Surgeon's Group or Practice Name:  [facility]  Phone number:  [phone] Fax number:  [phone]   Type of Clearance Requested:   - Medical  - Pharmacy:  Hold Aspirin  and Ticagrelor  (Brilinta ) not indicated   Type of Anesthesia:  Propofol    Additional requests/questions:    Bonney Augustin JONETTA Delores   12/20/2024, 3:37 PM   "

## 2024-12-21 ENCOUNTER — Telehealth: Payer: Self-pay

## 2024-12-21 NOTE — Telephone Encounter (Signed)
" ° °  Name: Joshua [name]  DOB: 03-13-1956  MRN: [id number]  Primary Cardiologist: Debby Sor, MD (Inactive)  Chart reviewed as part of pre-operative protocol coverage. Because of Chistian J Siefring's past medical history and time since last visit, he will require a follow-up in-office visit in order to better assess preoperative cardiovascular risk.  Patient is overdue for 81-month follow-up (was due 07/2024).  Pre-op covering staff: - Please schedule appointment and call patient to inform them. If patient already had an upcoming appointment within acceptable timeframe, please add pre-op clearance to the appointment notes so provider is aware. - Please contact requesting surgeon's office via preferred method (i.e, phone, fax) to inform them of need for appointment prior to surgery.  Damien JAYSON Braver, NP  12/21/2024, 8:19 AM   "

## 2024-12-21 NOTE — Telephone Encounter (Signed)
 Called patient to schedule an office appointment for a pre-op clearance. No answer left a vm to call back.

## 2024-12-21 NOTE — Telephone Encounter (Signed)
 Call patient to schedule an appointment for Preop clearance and left a VM.

## 2024-12-21 NOTE — Telephone Encounter (Signed)
 NO ACTION DONE

## 2024-12-24 NOTE — Telephone Encounter (Signed)
 Spoke to the patient and he informed me he has been scheduled for pre-op clearance on 01/09/25 with Lum Louis, NP.

## 2024-12-25 ENCOUNTER — Other Ambulatory Visit: Payer: Self-pay

## 2024-12-31 ENCOUNTER — Other Ambulatory Visit: Payer: Self-pay

## 2025-01-02 ENCOUNTER — Other Ambulatory Visit: Payer: Self-pay | Admitting: General Practice

## 2025-01-09 ENCOUNTER — Ambulatory Visit: Admitting: Emergency Medicine

## 2025-01-09 MED ORDER — ROSUVASTATIN CALCIUM 10 MG PO TABS: 40.0000 mg | ORAL_TABLET | Freq: Every day | ORAL | 1 refills | Status: AC

## 2025-01-09 NOTE — Telephone Encounter (Signed)
 Lipids Completed on 05/23/24

## 2025-01-10 NOTE — Progress Notes (Unsigned)
 " Cardiology Office Note:  .   Date:  01/10/2025  ID:  Joshua [name], DOB 1955/12/27, MRN [id number] PCP: Wonda Worth SQUIBB, PA  [facility] Providers Cardiologist:  Debby Sor, MD (Inactive) {  History of Present Illness: .   Joshua [name] is a 69 y.o. male with history of  coronary artery disease status with history of multiple PCI's with history of ISR in the LAD, HTN, HLD, type 2 diabetes, status post right metatarsal amputation in 2017, left BKA 05/2024, osteomyelitis, GERD, CKD stage IV, obstructive sleep apnea, and obesity.      CAD 2012 PCI with DES to his LAD and RCA  2017 DES to LAD and obtuse marginal in 2017  2020 PCI to LAD with scoring balloon angioplasty for ISR 4/20 08/2019 ISR to LAD, DES P LAD 9/20  04/2024 NM PET/CT with evidence of ischemia, high risk study with 3 times daily in decreased EF suggesting multivessel obstructive CAD.  Rest EF 35% myocardial blood flow reserve 1.68.  Social history      Patient with history of CAD with multiple PCI's and stent placements to his LAD with history of ISR.  Last intervention 2020 with stent placed to his pLAD.  He has a stress test 04/2024 that was deemed high risk with reduction in EF and evidence of multivessel obstructive CAD.  Seen in follow-up 05/2024 and was reporting occasional episodes of chest pain responsive to nitroglycerin .  Plans were to discuss with MD but unclear of follow-up after this.  He does have severe underlying CKD followed by nephrology with left foot infection at that time.  Eventually underwent left BKA with subsequent need for debridement.  CAD Hyperlipidemia -2012 PCI with DES to his LAD and RCA  -2017 DES to LAD and obtuse marginal in 2017  -2020 PCI to LAD with scoring balloon angioplasty for ISR 4/20 -08/2019 ISR to LAD, DES P LAD 9/20  -04/2024 NM PET/CT with evidence of ischemia, high risk study with 3 times daily in decreased EF suggesting multivessel obstructive CAD.  Rest EF 35%  myocardial blood flow reserve 1.68. He has extensive history.  Has prior stent placement to his RCA but multiple interventions and history of ISR to his LAD. 04/2024 NM PET/CT with evidence of ischemia, high risk study with 3 times daily in decreased EF suggesting multivessel obstructive CAD.  Rest EF 35% myocardial blood flow reserve 1.68. He will remain on DAPT therapy indefinitely.  Suspected HFrEF PET stress involved with EF around 35%. Obtain echocardiogram  OSA  Hypertension  Left BKA Follows with vascular surgery.  Date of Surgery:  Clearance 01/29/25                               Surgeon:  Dr. Dianna Surgeon's Group or Practice Name:  [facility]  Phone number:  [phone] Fax number:  [phone]   Type of Clearance Requested:   - Medical  - Pharmacy:  Hold Aspirin  and Ticagrelor  (Brilinta ) not indicated   ROS: Denies: Chest pain, shortness of breath, orthopnea, peripheral edema, palpitations, syncope, decreased exercise capacity, fatigue, dizziness.   Studies Reviewed: .         Risk Assessment/Calculations:   {Does this patient have ATRIAL FIBRILLATION?:[id number]} No BP recorded.  {Refresh Note OR Click here to enter BP  :1}***       Physical Exam:   VS:  There were no vitals taken for this visit.  Wt Readings from Last 3 Encounters:  10/11/24 234 lb (106.1 kg)  10/05/24 234 lb (106.1 kg)  08/16/24 234 lb (106.1 kg)    GEN: Well nourished, well developed in no acute distress NECK: No JVD; No carotid bruits CARDIAC: ***RRR, no murmurs, rubs, gallops RESPIRATORY:  Clear to auscultation without rales, wheezing or rhonchi  ABDOMEN: Soft, non-tender, non-distended EXTREMITIES:  No edema; No deformity   ASSESSMENT AND PLAN: .         {Are you ordering a CV Procedure (e.g. stress test, cath, DCCV, TEE, etc)?   Press F2        :[id number]}  Dispo: ***  Signed, Thom LITTIE Sluder, PA-C  "

## 2025-01-11 ENCOUNTER — Ambulatory Visit: Admitting: Cardiology

## 2025-01-12 ENCOUNTER — Other Ambulatory Visit (HOSPITAL_COMMUNITY): Payer: Self-pay

## 2025-01-16 ENCOUNTER — Encounter (HOSPITAL_COMMUNITY): Payer: Self-pay | Admitting: Gastroenterology

## 2025-01-16 NOTE — Telephone Encounter (Signed)
 Pt has been scheduled to see Rosaline Bane, NP 01/21/25 @ 8:50 @ [facility] location. Pt has been given address to [facility].

## 2025-01-16 NOTE — Telephone Encounter (Signed)
 Due to he recent weather patient's appointment was cancelled and patient has not been able to get a new appointment scheduled for cardiac clearance procedure is scheduled for 2/17 and is needing some medication to be held tried contacting patient to schedule IN OFFICE VISIT no answer left a detailed vm to call back and schedule

## 2025-01-21 ENCOUNTER — Ambulatory Visit (HOSPITAL_BASED_OUTPATIENT_CLINIC_OR_DEPARTMENT_OTHER): Admitting: Nurse Practitioner

## 2025-01-29 ENCOUNTER — Encounter (HOSPITAL_COMMUNITY): Payer: Self-pay

## 2025-01-29 ENCOUNTER — Ambulatory Visit (HOSPITAL_COMMUNITY): Admit: 2025-01-29 | Admitting: Gastroenterology

## 2025-01-29 SURGERY — COLONOSCOPY
Anesthesia: Monitor Anesthesia Care

## 2025-02-04 ENCOUNTER — Ambulatory Visit: Admitting: Internal Medicine

## 2025-04-11 ENCOUNTER — Ambulatory Visit (HOSPITAL_COMMUNITY)
